# Patient Record
Sex: Male | Born: 1945
Health system: Southern US, Community
[De-identification: ages and names within clinical notes are randomized; demographics above are authoritative.]

## PROBLEM LIST (undated history)

## (undated) ENCOUNTER — Ambulatory Visit: Payer: Medicare Other | Source: Home / Self Care

## (undated) DIAGNOSIS — C649 Malignant neoplasm of unspecified kidney, except renal pelvis: Secondary | ICD-10-CM

## (undated) DIAGNOSIS — K625 Hemorrhage of anus and rectum: Secondary | ICD-10-CM

## (undated) DIAGNOSIS — N3281 Overactive bladder: Secondary | ICD-10-CM

## (undated) DIAGNOSIS — T7840XA Allergy, unspecified, initial encounter: Secondary | ICD-10-CM

## (undated) DIAGNOSIS — G8929 Other chronic pain: Secondary | ICD-10-CM

## (undated) DIAGNOSIS — G5600 Carpal tunnel syndrome, unspecified upper limb: Secondary | ICD-10-CM

## (undated) DIAGNOSIS — R350 Frequency of micturition: Secondary | ICD-10-CM

## (undated) DIAGNOSIS — K6289 Other specified diseases of anus and rectum: Secondary | ICD-10-CM

## (undated) DIAGNOSIS — F329 Major depressive disorder, single episode, unspecified: Secondary | ICD-10-CM

## (undated) DIAGNOSIS — H409 Unspecified glaucoma: Secondary | ICD-10-CM

## (undated) DIAGNOSIS — Z860100 Personal history of colon polyps, unspecified: Secondary | ICD-10-CM

## (undated) DIAGNOSIS — F4310 Post-traumatic stress disorder, unspecified: Secondary | ICD-10-CM

## (undated) DIAGNOSIS — C169 Malignant neoplasm of stomach, unspecified: Secondary | ICD-10-CM

## (undated) DIAGNOSIS — I10 Essential (primary) hypertension: Secondary | ICD-10-CM

## (undated) DIAGNOSIS — J309 Allergic rhinitis, unspecified: Secondary | ICD-10-CM

## (undated) DIAGNOSIS — R351 Nocturia: Secondary | ICD-10-CM

## (undated) DIAGNOSIS — K5900 Constipation, unspecified: Secondary | ICD-10-CM

## (undated) DIAGNOSIS — F32A Depression, unspecified: Secondary | ICD-10-CM

## (undated) DIAGNOSIS — M549 Dorsalgia, unspecified: Secondary | ICD-10-CM

## (undated) DIAGNOSIS — R252 Cramp and spasm: Secondary | ICD-10-CM

## (undated) DIAGNOSIS — M1990 Unspecified osteoarthritis, unspecified site: Secondary | ICD-10-CM

## (undated) DIAGNOSIS — G4730 Sleep apnea, unspecified: Secondary | ICD-10-CM

## (undated) DIAGNOSIS — E119 Type 2 diabetes mellitus without complications: Secondary | ICD-10-CM

## (undated) DIAGNOSIS — IMO0002 Reserved for concepts with insufficient information to code with codable children: Secondary | ICD-10-CM

## (undated) DIAGNOSIS — K219 Gastro-esophageal reflux disease without esophagitis: Secondary | ICD-10-CM

## (undated) DIAGNOSIS — C61 Malignant neoplasm of prostate: Secondary | ICD-10-CM

## (undated) DIAGNOSIS — R209 Unspecified disturbances of skin sensation: Secondary | ICD-10-CM

## (undated) DIAGNOSIS — F419 Anxiety disorder, unspecified: Secondary | ICD-10-CM

## (undated) DIAGNOSIS — Z8719 Personal history of other diseases of the digestive system: Secondary | ICD-10-CM

## (undated) DIAGNOSIS — E785 Hyperlipidemia, unspecified: Secondary | ICD-10-CM

## (undated) HISTORY — DX: Reserved for concepts with insufficient information to code with codable children: IMO0002

## (undated) HISTORY — DX: Hyperlipidemia, unspecified: E78.5

## (undated) HISTORY — PX: UPPER GASTROINTESTINAL ENDOSCOPY: SHX188

## (undated) HISTORY — DX: Type 2 diabetes mellitus without complications: E11.9

## (undated) HISTORY — DX: Carpal tunnel syndrome, unspecified upper limb: G56.00

## (undated) HISTORY — PX: WOUND EXPLORATION: SHX2669

## (undated) HISTORY — PX: POLYPECTOMY: SHX149

## (undated) HISTORY — DX: Personal history of other diseases of the digestive system: Z87.19

## (undated) HISTORY — PX: SHOULDER SURGERY: SHX246

## (undated) HISTORY — PX: CARPAL TUNNEL RELEASE: SHX101

## (undated) HISTORY — PX: CERVICAL FUSION: SHX112

## (undated) HISTORY — PX: OTHER SURGICAL HISTORY: SHX169

## (undated) HISTORY — PX: COLONOSCOPY: SHX174

## (undated) HISTORY — DX: Essential (primary) hypertension: I10

## (undated) HISTORY — DX: Allergic rhinitis, unspecified: J30.9

## (undated) HISTORY — DX: Malignant neoplasm of unspecified kidney, except renal pelvis: C64.9

## (undated) HISTORY — PX: BACK SURGERY: SHX140

## (undated) HISTORY — DX: Gastro-esophageal reflux disease without esophagitis: K21.9

## (undated) HISTORY — DX: Unspecified disturbances of skin sensation: R20.9

## (undated) HISTORY — DX: Other specified diseases of anus and rectum: K62.89

## (undated) HISTORY — DX: Cramp and spasm: R25.2

## (undated) HISTORY — PX: ESOPHAGOGASTRODUODENOSCOPY: SHX1529

## (undated) HISTORY — DX: Malignant neoplasm of stomach, unspecified: C16.9

## (undated) HISTORY — DX: Malignant neoplasm of prostate: C61

## (undated) HISTORY — DX: Allergy, unspecified, initial encounter: T78.40XA

---

## 1998-11-10 ENCOUNTER — Encounter: Payer: Self-pay | Admitting: Internal Medicine

## 1998-11-10 ENCOUNTER — Ambulatory Visit (HOSPITAL_COMMUNITY): Admission: RE | Admit: 1998-11-10 | Discharge: 1998-11-10 | Payer: Self-pay | Admitting: Internal Medicine

## 1999-07-12 ENCOUNTER — Encounter: Payer: Self-pay | Admitting: Internal Medicine

## 1999-07-12 ENCOUNTER — Ambulatory Visit (HOSPITAL_COMMUNITY): Admission: RE | Admit: 1999-07-12 | Discharge: 1999-07-12 | Payer: Self-pay | Admitting: Internal Medicine

## 1999-07-12 ENCOUNTER — Encounter (INDEPENDENT_AMBULATORY_CARE_PROVIDER_SITE_OTHER): Payer: Self-pay

## 2000-06-27 ENCOUNTER — Inpatient Hospital Stay (HOSPITAL_COMMUNITY): Admission: EM | Admit: 2000-06-27 | Discharge: 2000-06-28 | Payer: Self-pay | Admitting: Emergency Medicine

## 2000-06-27 ENCOUNTER — Encounter: Payer: Self-pay | Admitting: Internal Medicine

## 2002-07-23 ENCOUNTER — Emergency Department (HOSPITAL_COMMUNITY): Admission: EM | Admit: 2002-07-23 | Discharge: 2002-07-23 | Payer: Self-pay | Admitting: Emergency Medicine

## 2002-07-23 ENCOUNTER — Encounter: Payer: Self-pay | Admitting: Emergency Medicine

## 2002-10-08 ENCOUNTER — Ambulatory Visit (HOSPITAL_BASED_OUTPATIENT_CLINIC_OR_DEPARTMENT_OTHER): Admission: RE | Admit: 2002-10-08 | Discharge: 2002-10-08 | Payer: Self-pay | Admitting: Orthopedic Surgery

## 2003-12-24 HISTORY — PX: OTHER SURGICAL HISTORY: SHX169

## 2004-03-22 ENCOUNTER — Ambulatory Visit: Payer: Self-pay | Admitting: Internal Medicine

## 2004-04-12 ENCOUNTER — Ambulatory Visit: Payer: Self-pay | Admitting: Internal Medicine

## 2004-09-15 ENCOUNTER — Ambulatory Visit: Payer: Self-pay | Admitting: Internal Medicine

## 2005-02-14 ENCOUNTER — Ambulatory Visit: Payer: Self-pay | Admitting: Internal Medicine

## 2005-02-20 ENCOUNTER — Ambulatory Visit: Payer: Self-pay | Admitting: Internal Medicine

## 2005-07-12 ENCOUNTER — Ambulatory Visit: Payer: Self-pay | Admitting: Internal Medicine

## 2005-08-08 ENCOUNTER — Ambulatory Visit: Payer: Self-pay | Admitting: Internal Medicine

## 2005-08-14 ENCOUNTER — Encounter (INDEPENDENT_AMBULATORY_CARE_PROVIDER_SITE_OTHER): Payer: Self-pay | Admitting: *Deleted

## 2005-08-14 ENCOUNTER — Ambulatory Visit: Payer: Self-pay | Admitting: Internal Medicine

## 2005-08-14 LAB — HM COLONOSCOPY

## 2006-04-11 ENCOUNTER — Ambulatory Visit: Payer: Self-pay | Admitting: Internal Medicine

## 2006-07-27 ENCOUNTER — Emergency Department (HOSPITAL_COMMUNITY): Admission: EM | Admit: 2006-07-27 | Discharge: 2006-07-27 | Payer: Self-pay | Admitting: Emergency Medicine

## 2007-01-18 ENCOUNTER — Emergency Department (HOSPITAL_COMMUNITY): Admission: EM | Admit: 2007-01-18 | Discharge: 2007-01-18 | Payer: Self-pay | Admitting: Emergency Medicine

## 2007-01-21 ENCOUNTER — Ambulatory Visit: Payer: Self-pay | Admitting: Internal Medicine

## 2007-01-21 ENCOUNTER — Encounter: Admission: RE | Admit: 2007-01-21 | Discharge: 2007-01-21 | Payer: Self-pay | Admitting: Internal Medicine

## 2007-03-16 ENCOUNTER — Encounter: Payer: Self-pay | Admitting: Internal Medicine

## 2007-03-16 DIAGNOSIS — J309 Allergic rhinitis, unspecified: Secondary | ICD-10-CM | POA: Insufficient documentation

## 2007-03-16 DIAGNOSIS — G5600 Carpal tunnel syndrome, unspecified upper limb: Secondary | ICD-10-CM

## 2007-03-16 DIAGNOSIS — E1136 Type 2 diabetes mellitus with diabetic cataract: Secondary | ICD-10-CM | POA: Insufficient documentation

## 2007-03-16 DIAGNOSIS — E785 Hyperlipidemia, unspecified: Secondary | ICD-10-CM

## 2007-03-16 DIAGNOSIS — I10 Essential (primary) hypertension: Secondary | ICD-10-CM

## 2007-03-16 DIAGNOSIS — E119 Type 2 diabetes mellitus without complications: Secondary | ICD-10-CM

## 2007-03-16 DIAGNOSIS — E118 Type 2 diabetes mellitus with unspecified complications: Secondary | ICD-10-CM | POA: Insufficient documentation

## 2007-03-16 DIAGNOSIS — K219 Gastro-esophageal reflux disease without esophagitis: Secondary | ICD-10-CM

## 2007-03-16 DIAGNOSIS — Z8719 Personal history of other diseases of the digestive system: Secondary | ICD-10-CM | POA: Insufficient documentation

## 2007-03-16 HISTORY — DX: Carpal tunnel syndrome, unspecified upper limb: G56.00

## 2007-03-16 HISTORY — DX: Allergic rhinitis, unspecified: J30.9

## 2007-03-16 HISTORY — DX: Hyperlipidemia, unspecified: E78.5

## 2007-03-16 HISTORY — DX: Gastro-esophageal reflux disease without esophagitis: K21.9

## 2007-03-16 HISTORY — DX: Essential (primary) hypertension: I10

## 2007-03-16 HISTORY — DX: Personal history of other diseases of the digestive system: Z87.19

## 2007-03-16 HISTORY — DX: Type 2 diabetes mellitus without complications: E11.9

## 2007-03-22 ENCOUNTER — Encounter: Admission: RE | Admit: 2007-03-22 | Discharge: 2007-03-22 | Payer: Self-pay | Admitting: Orthopedic Surgery

## 2007-05-17 ENCOUNTER — Encounter: Payer: Self-pay | Admitting: Internal Medicine

## 2007-05-21 ENCOUNTER — Ambulatory Visit: Payer: Self-pay | Admitting: Internal Medicine

## 2007-05-21 DIAGNOSIS — R209 Unspecified disturbances of skin sensation: Secondary | ICD-10-CM

## 2007-05-21 HISTORY — DX: Unspecified disturbances of skin sensation: R20.9

## 2007-05-22 DIAGNOSIS — R252 Cramp and spasm: Secondary | ICD-10-CM | POA: Insufficient documentation

## 2007-05-22 HISTORY — DX: Cramp and spasm: R25.2

## 2008-04-26 ENCOUNTER — Encounter: Admission: RE | Admit: 2008-04-26 | Discharge: 2008-04-26 | Payer: Self-pay | Admitting: Specialist

## 2008-11-17 ENCOUNTER — Ambulatory Visit: Payer: Self-pay | Admitting: Internal Medicine

## 2008-11-17 LAB — CONVERTED CEMR LAB
ALT: 22 units/L (ref 0–53)
BUN: 26 mg/dL — ABNORMAL HIGH (ref 6–23)
Basophils Absolute: 0 10*3/uL (ref 0.0–0.1)
Bilirubin, Direct: 0.1 mg/dL (ref 0.0–0.3)
Chloride: 104 meq/L (ref 96–112)
Cholesterol: 152 mg/dL (ref 0–200)
Creatinine, Ser: 1.3 mg/dL (ref 0.4–1.5)
Eosinophils Relative: 2.1 % (ref 0.0–5.0)
GFR calc non Af Amer: 71.8 mL/min (ref >60)
Glucose, Bld: 104 mg/dL — ABNORMAL HIGH (ref 70–99)
HCT: 39.5 % (ref 39.0–52.0)
LDL Cholesterol: 93 mg/dL (ref 0–99)
Leukocytes, UA: NEGATIVE
Lymphs Abs: 1.2 10*3/uL (ref 0.7–4.0)
MCV: 91.3 fL (ref 78.0–100.0)
Monocytes Absolute: 0.5 10*3/uL (ref 0.1–1.0)
Neutrophils Relative %: 47.5 % (ref 43.0–77.0)
Nitrite: NEGATIVE
PSA: 3.72 ng/mL (ref 0.10–4.00)
Platelets: 173 10*3/uL (ref 150.0–400.0)
RDW: 13.6 % (ref 11.5–14.6)
Specific Gravity, Urine: 1.025 (ref 1.000–1.030)
TSH: 1.69 microintl units/mL (ref 0.35–5.50)
Total Bilirubin: 0.7 mg/dL (ref 0.3–1.2)
Total Protein, Urine: NEGATIVE mg/dL
Triglycerides: 31 mg/dL (ref 0.0–149.0)
VLDL: 6.2 mg/dL (ref 0.0–40.0)
WBC: 3.3 10*3/uL — ABNORMAL LOW (ref 4.5–10.5)
pH: 6 (ref 5.0–8.0)

## 2008-11-23 ENCOUNTER — Ambulatory Visit: Payer: Self-pay | Admitting: Internal Medicine

## 2008-11-23 DIAGNOSIS — IMO0002 Reserved for concepts with insufficient information to code with codable children: Secondary | ICD-10-CM

## 2008-11-23 HISTORY — DX: Reserved for concepts with insufficient information to code with codable children: IMO0002

## 2008-12-01 ENCOUNTER — Ambulatory Visit: Payer: Self-pay | Admitting: Internal Medicine

## 2009-03-03 ENCOUNTER — Encounter: Admission: RE | Admit: 2009-03-03 | Discharge: 2009-03-03 | Payer: Self-pay | Admitting: Orthopedic Surgery

## 2009-03-23 ENCOUNTER — Emergency Department (HOSPITAL_COMMUNITY): Admission: EM | Admit: 2009-03-23 | Discharge: 2009-03-23 | Payer: Self-pay | Admitting: Family Medicine

## 2009-04-28 ENCOUNTER — Ambulatory Visit (HOSPITAL_COMMUNITY): Admission: RE | Admit: 2009-04-28 | Discharge: 2009-04-28 | Payer: Self-pay | Admitting: Anesthesiology

## 2009-05-03 ENCOUNTER — Ambulatory Visit (HOSPITAL_COMMUNITY): Admission: RE | Admit: 2009-05-03 | Discharge: 2009-05-04 | Payer: Self-pay | Admitting: Orthopaedic Surgery

## 2009-10-06 ENCOUNTER — Encounter: Payer: Self-pay | Admitting: Internal Medicine

## 2009-10-09 ENCOUNTER — Encounter: Admission: RE | Admit: 2009-10-09 | Discharge: 2009-10-09 | Payer: Self-pay | Admitting: Specialist

## 2009-11-19 ENCOUNTER — Telehealth (INDEPENDENT_AMBULATORY_CARE_PROVIDER_SITE_OTHER): Payer: Self-pay | Admitting: *Deleted

## 2009-11-25 ENCOUNTER — Telehealth (INDEPENDENT_AMBULATORY_CARE_PROVIDER_SITE_OTHER): Payer: Self-pay | Admitting: *Deleted

## 2009-12-20 ENCOUNTER — Ambulatory Visit: Payer: Self-pay | Admitting: Internal Medicine

## 2009-12-20 LAB — CONVERTED CEMR LAB
ALT: 22 units/L (ref 0–53)
AST: 20 units/L (ref 0–37)
BUN: 26 mg/dL — ABNORMAL HIGH (ref 6–23)
Basophils Relative: 0.5 % (ref 0.0–3.0)
Bilirubin, Direct: 0.1 mg/dL (ref 0.0–0.3)
Chloride: 102 meq/L (ref 96–112)
Eosinophils Relative: 1 % (ref 0.0–5.0)
GFR calc non Af Amer: 73.5 mL/min (ref >60)
HCT: 39.7 % (ref 39.0–52.0)
Hemoglobin, Urine: NEGATIVE
LDL Cholesterol: 107 mg/dL — ABNORMAL HIGH (ref 0–99)
Lymphs Abs: 1.4 10*3/uL (ref 0.7–4.0)
MCV: 91.7 fL (ref 78.0–100.0)
Monocytes Relative: 12.7 % — ABNORMAL HIGH (ref 3.0–12.0)
Nitrite: NEGATIVE
PSA: 5.2 ng/mL — ABNORMAL HIGH (ref 0.10–4.00)
Platelets: 167 10*3/uL (ref 150.0–400.0)
Potassium: 5.2 meq/L — ABNORMAL HIGH (ref 3.5–5.1)
RBC: 4.33 M/uL (ref 4.22–5.81)
Sodium: 140 meq/L (ref 135–145)
Specific Gravity, Urine: 1.025 (ref 1.000–1.030)
TSH: 0.66 microintl units/mL (ref 0.35–5.50)
Total Bilirubin: 0.6 mg/dL (ref 0.3–1.2)
Total Protein: 7 g/dL (ref 6.0–8.3)
Urobilinogen, UA: 0.2 (ref 0.0–1.0)
VLDL: 6.2 mg/dL (ref 0.0–40.0)
WBC: 3.2 10*3/uL — ABNORMAL LOW (ref 4.5–10.5)

## 2009-12-21 ENCOUNTER — Telehealth: Payer: Self-pay | Admitting: Internal Medicine

## 2009-12-21 DIAGNOSIS — C61 Malignant neoplasm of prostate: Secondary | ICD-10-CM | POA: Insufficient documentation

## 2009-12-21 HISTORY — DX: Malignant neoplasm of prostate: C61

## 2009-12-31 ENCOUNTER — Encounter (INDEPENDENT_AMBULATORY_CARE_PROVIDER_SITE_OTHER): Payer: Self-pay | Admitting: *Deleted

## 2010-01-14 ENCOUNTER — Ambulatory Visit: Payer: Self-pay | Admitting: Internal Medicine

## 2010-01-14 ENCOUNTER — Encounter: Payer: Self-pay | Admitting: Internal Medicine

## 2010-01-26 ENCOUNTER — Encounter: Payer: Self-pay | Admitting: Internal Medicine

## 2010-02-02 ENCOUNTER — Telehealth (INDEPENDENT_AMBULATORY_CARE_PROVIDER_SITE_OTHER): Payer: Self-pay | Admitting: *Deleted

## 2010-03-03 ENCOUNTER — Encounter: Payer: Self-pay | Admitting: Internal Medicine

## 2010-03-07 ENCOUNTER — Encounter: Payer: Self-pay | Admitting: Internal Medicine

## 2010-03-11 ENCOUNTER — Ambulatory Visit
Admission: RE | Admit: 2010-03-11 | Discharge: 2010-03-15 | Payer: Self-pay | Source: Home / Self Care | Admitting: Radiation Oncology

## 2010-03-14 ENCOUNTER — Encounter: Payer: Self-pay | Admitting: Internal Medicine

## 2010-04-06 ENCOUNTER — Telehealth (INDEPENDENT_AMBULATORY_CARE_PROVIDER_SITE_OTHER): Payer: Self-pay | Admitting: *Deleted

## 2010-05-19 ENCOUNTER — Ambulatory Visit
Admission: RE | Admit: 2010-05-19 | Discharge: 2010-06-14 | Payer: Self-pay | Source: Home / Self Care | Attending: Radiation Oncology | Admitting: Radiation Oncology

## 2010-05-27 ENCOUNTER — Ambulatory Visit
Admission: RE | Admit: 2010-05-27 | Discharge: 2010-05-27 | Payer: Self-pay | Source: Home / Self Care | Attending: Internal Medicine | Admitting: Internal Medicine

## 2010-06-14 NOTE — Progress Notes (Signed)
   Request recieved from [place], [place] [facility] [employer] sent to [place]  February 02, 2010 1:54 PM     Appended Document:  Request for records received from [place], [facility]. Request forwarded to [facility].

## 2010-06-14 NOTE — Progress Notes (Signed)
Summary: REFERRAL  Phone Note Call from Patient Call back at Home Phone [phone] Call back at [phone]   Caller: Patient Reason for Call: Referral Summary of Call: REQUEST REFERRAL TO SEE UROLOGIST, HAS BEEN HAVING TROUBLE URINATING FOR THE LAST 3 MTHS, WAS TOLD BY HIS INSURANCE THAT HIS PROSTATE LEVELS WERE HIGH.... IS [place] TO SEE DR NORINS ON 01/14/10 FOR HIS YEARLY Initial call taken by: Darnell Level,  December 21, 2009 11:28 AM  Follow-up for Phone Call        Chart reviewed: last CPX July '10 normal prostate exam. Labs reviewed: PSA 11/17/08 -3.7, 8/810 - 5.2. This is a rate of acceleration that is greater than normal, although this can be caused by conditions other than prostate cancer.   Plan - per [facility]'s [facility]. [facility] notified.  Follow-up by: Neena Rhymes MD,  December 21, 2009 1:13 PM  New Problems: ELEVATED PROSTATE SPECIFIC ANTIGEN (ICD-790.93)   New Problems: ELEVATED PROSTATE SPECIFIC ANTIGEN (ICD-790.93)

## 2010-06-14 NOTE — Letter (Signed)
Summary: [facility]   Imported By: Bubba Hales 10/14/2009 10:11:37  _____________________________________________________________________  External Attachment:    Type:   Image     Comment:   External Document

## 2010-06-14 NOTE — Assessment & Plan Note (Signed)
Summary: CPX/[facility]   Vital Signs:  Patient profile:   65 year old male Height:      72 inches Weight:      266 pounds BMI:     36.21 O2 Sat:      97 % on Room air Temp:     97.3 degrees F oral Pulse rate:   74 / minute BP sitting:   112 / 78  (left arm) Cuff size:   large  Vitals Entered By: Charlynne Cousins CMA (January 14, 2010 1:16 PM)  Nutrition Counseling: Patient's BMI is greater than 25 and therefore counseled on weight management options.  O2 Flow:  Room air CC: cpx/ ab, Back Pain, Hypertension Management, Lipid Management   Primary Care Provider:  Norins  CC:  cpx/ ab, Back Pain, Hypertension Management, and Lipid Management.  History of Present Illness: Patient presents for routine medical exam. He does endorse increased knee pain and exacerbation of back. He is s/p cervical spine surgery by Dr. Lorin Mercy in Dec '10. He has been seeing Dr. Louanne Skye for on-gong back pain. Recent Lumbar spine films May '11 rviewed: L4-5, L5-S1 DDD with some foraminal encroachment.  Patient is waiting to see Urology based on lab abnormality: rise in PSA from 3.7 to 5.2 over 12 months. His appointment is Sept 14th.   Having ED: able to attain an erection but not maintain to complete intercourse.   Back Pain History:      The patient's back pain has been present for > 6 weeks.  The pain is located in the lower back region and does not radiate below the knees.  He states this is not work related.  He states that he has had a prior history of back pain.  The patient has had a recent course of physical therapy.    Hypertension History:      He denies headache, chest pain, palpitations, dyspnea with exertion, orthopnea, PND, peripheral edema, neurologic problems, and side effects from treatment.  He notes no problems with any antihypertensive medication side effects.        Positive major cardiovascular risk factors include male age 86 years old or older, diabetes, hyperlipidemia, and  hypertension.  Negative major cardiovascular risk factors include non-tobacco-user status.        Further assessment for target organ damage reveals no history of ASHD, cardiac end-organ damage (CHF/LVH), stroke/TIA, peripheral vascular disease, renal insufficiency, or hypertensive retinopathy.    Lipid Management History:      Positive NCEP/ATP III risk factors include male age 61 years old or older, diabetes, and hypertension.  Negative NCEP/ATP III risk factors include non-tobacco-user status, no ASHD (atherosclerotic heart disease), no prior stroke/TIA, no peripheral vascular disease, and no history of aortic aneurysm.         Current Medications (verified): 1)  Diltiazem Hcl Er Beads 120 Mg  Cp24 (Diltiazem Hcl Er Beads) .... Take 1 Tablet By Mouth Once A Day 2)  Glipizide 10 Mg  Tabs (Glipizide) .... Take 1 By Mouth Qd 3)  Simvastatin 80 Mg  Tabs (Simvastatin) .... Take 1/2 Tablet By Mouth Once A Day 4)  Prilosec 20 Mg  Cpdr (Omeprazole) .... Take 1 By Mouth Qd 5)  Aspirin 325 Mg  Tabs (Aspirin) .... Take 1 By Mouth Qd 6)  Lisinopril 40 Mg  Tabs (Lisinopril) .... Take 1/2 Tablet By Mouth Once A Day 7)  Gemfibrozil 600 Mg  Tabs (Gemfibrozil) .... Take 2 Tablets By Mouth Once A Day 8)  Actos 45 Mg  Tabs (Pioglitazone Hcl) .... Take 1 Tablet By Mouth Once A Day 9)  Ergocalciferol 50000 Unit Caps (Ergocalciferol) .... Take 1 Tablet By Mouth Once A Day 10)  Ibuprofen 800 Mg Tabs (Ibuprofen) .... As Needed 11)  Tramadol Hcl 50 Mg Tabs (Tramadol Hcl) .... Take 1 Tablet By Mouth Once A Day 12)  Clotrimazole-Betamethasone 1-0.05 % Crea (Clotrimazole-Betamethasone) .... Apply To Rash Two Times A Day  Allergies (verified): No Known Drug Allergies  Past History:  Past Medical History: Last updated: 11/23/2008 BACK PAIN WITH RADICULOPATHY (ICD-729.2) PARESTHESIA (ICD-782.0) LEG CRAMPS (ICD-729.82) CARPAL TUNNEL SYNDROME, BILATERAL (ICD-354.0) GASTROINTESTINAL HEMORRHAGE, HX OF  (ICD-V12.79) GERD (ICD-530.81) HYPERTENSION (ICD-401.9) HYPERLIPIDEMIA (ICD-272.4) DIABETES MELLITUS, TYPE II (ICD-250.00) ALLERGIC RHINITIS (ICD-477.9)  Past Surgical History: Last updated: 2007/06/04 Back surgery (1983), repeat surgery; ESI '08 Shoulder surgery carpal tunnel release bilateral Management of a wound that he sustained in the millitary Cardiac Catherization (06/28/2000)-non-obstructive diseae Stress Cardiolite (12/24/2003)-negative for ischemia  Family History: Last updated: 06-04-07 mother died at 50-perioperative death father - no major health problems 1 of 6 sisters died age 10 of cancer positive fam hx for hypertension  Social History: Last updated: 06-04-2007 married-divorced '89, remarried '96 2 sons, 1 daughter retired from Teacher, English as a foreign language for [employer], has a Financial planner business  Review of Systems       The patient complains of weight gain and dyspnea on exertion.  The patient denies anorexia, fever, weight loss, decreased hearing, chest pain, peripheral edema, prolonged cough, abdominal pain, melena, hematochezia, severe indigestion/heartburn, incontinence, muscle weakness, transient blindness, difficulty walking, unusual weight change, enlarged lymph nodes, and testicular masses.         Heavy sweats day and night for 6 months or longer. It is like a hot flash.  Takes mag citrate twice a month. He did have an episode of sharp pain right side with radition to groin that caused him to lay down. Resolved spontaneously over several hours.   Physical Exam  General:  Obese AA male in no distress Head:  normocephalic, atraumatic, and no abnormalities observed.   Eyes:  vision grossly intact, pupils equal, pupils round, and corneas and lenses clear.  Further exam deferred to opthal Ears:  External ear exam shows no significant lesions or deformities.  Otoscopic examination reveals clear canals, tympanic membranes are intact bilaterally without bulging,  retraction, inflammation or discharge. Hearing is grossly normal bilaterally. Nose:  no external deformity and no external erythema.   Mouth:  Oral mucosa and oropharynx without lesions or exudates.  Teeth in good repair. Neck:  supple, full ROM, no thyromegaly, and no carotid bruits.   Chest Wall:  No deformities, masses, tenderness or gynecomastia noted. Lungs:  Normal respiratory effort, chest expands symmetrically. Lungs are clear to auscultation, no crackles or wheezes. Heart:  Normal rate and regular rhythm. S1 and S2 normal without gallop, murmur, click, rub or other extra sounds. Abdomen:  obese, soft, non-tender, normal bowel sounds, no guarding, and no hepatomegaly.   Genitalia:  deferred to up-coming urology consultation Prostate:  deferred to up- coming urology consultation Msk:  normal ROM, no joint tenderness, no joint swelling, no joint warmth, and no joint deformities.   Pulses:  2+ radial and DP pulses Extremities:  No clubbing, cyanosis, edema, or deformity noted with normal full range of motion of all joints.   Neurologic:  alert & oriented X3, cranial nerves II-XII intact, strength normal in all extremities, gait normal, and DTRs symmetrical and normal.  Skin:  turgor normal, color normal, no ecchymoses, and no ulcerations.   Cervical Nodes:  no anterior cervical adenopathy and no posterior cervical adenopathy.   Inguinal Nodes:  no R inguinal adenopathy and no L inguinal adenopathy.   Psych:  Oriented X3, memory intact for recent and remote, normally interactive, good eye contact, and not anxious appearing.    Diabetes Management Exam:    Foot Exam (with socks and/or shoes not present):       Sensory-Pinprick/Light touch:          Right medial foot (L-4): normal          Right dorsal foot (L-5): normal          Right lateral foot (S-1): normal       Sensory-other: slighly decreased deep vibratory sensation distal right foot       Inspection:          Right foot:  normal       Nails:          Right foot: normal    Eye Exam:       Eye Exam done elsewhere          Date: 07/13/2009          Results: normal          Done by: Dr Ricki Miller   Impression & Recommendations:  Problem # 1:  ELEVATED PROSTATE SPECIFIC ANTIGEN (ICD-790.93) Patient with PSA 5.2 and increase of >0.7 over 12 months.   Plan - he is scheduled to see urologist September 14th  Problem # 2:  BACK PAIN WITH RADICULOPATHY (ICD-729.2) He is seeing Dr. Louanne Skye for managment of his back pain. No mention of contemplating surgery at this time.   Problem # 3:  HYPERTENSION (ICD-401.9)  His updated medication list for this problem includes:    Diltiazem Hcl Er Beads 120 Mg Cp24 (Diltiazem hcl er beads) .Marland Kitchen... Take 1 tablet by mouth once a day    Lisinopril 40 Mg Tabs (Lisinopril) .Marland Kitchen... Take 1/2 tablet by mouth once a day  BP today: 112/78 Prior BP: 126/64 (12/01/2008)  10 Yr Risk Heart Disease: 18 %  Labs Reviewed: K+: 5.2 (12/20/2009) Creat: : 1.3 (12/20/2009)     Excellent control on present medications. Normal renal function documented.   Plan - continuepresent medications.   Problem # 4:  HYPERLIPIDEMIA (P102836.4) Tolerating medications well. LDL at 107 is almost to goal of 100 or less.  Plan - continue present medication          Improve on diet - low fat          Develope a low-impact aerobic progam: continue gym twice a week, consider adding water exercise, i.e. swimming or water aerobics  His updated medication list for this problem includes:    Simvastatin 80 Mg Tabs (Simvastatin) .Marland Kitchen... Take 1/2 tablet by mouth once a day    Gemfibrozil 600 Mg Tabs (Gemfibrozil) .Marland Kitchen... Take 2 tablets by mouth once a day  Problem # 5:  DIABETES MELLITUS, TYPE II (ICD-250.00) Serum glucose is fine. A1C was not done in routine lab panel.   Plan continue present medications        at next lab draw will need an A1C - will notifiy urology.  His updated medication list for this  problem includes:    Glipizide 10 Mg Tabs (Glipizide) .Marland Kitchen... Take 1 by mouth qd    Aspirin 325 Mg Tabs (Aspirin) .Marland Kitchen... Take 1 by mouth qd  Lisinopril 40 Mg Tabs (Lisinopril) .Marland Kitchen... Take 1/2 tablet by mouth once a day    Actos 45 Mg Tabs (Pioglitazone hcl) .Marland Kitchen... Take 1 tablet by mouth once a day  Problem # 6:  Preventive Health Care (ICD-V70.0) History as noted. Physical exam was non-focal with weight noted as problem. Lab results are fine except for elevated PSA and LDL just above goal. Immunizations - tenus in '09; given pneumovax and Zostavax at today's visit.  In summary - a very nice man in stable condidtion. He will pursue evaluation of elevated PSA. He will need to continue life-style management of diabetes and cholesterol through good diet and exercise with no change in medications. He is current with health maintenance. He will return in 6 months or as needed.   Complete Medication List: 1)  Diltiazem Hcl Er Beads 120 Mg Cp24 (Diltiazem hcl er beads) .... Take 1 tablet by mouth once a day 2)  Glipizide 10 Mg Tabs (Glipizide) .... Take 1 by mouth qd 3)  Simvastatin 80 Mg Tabs (Simvastatin) .... Take 1/2 tablet by mouth once a day 4)  Prilosec 20 Mg Cpdr (Omeprazole) .... Take 1 by mouth qd 5)  Aspirin 325 Mg Tabs (Aspirin) .... Take 1 by mouth qd 6)  Lisinopril 40 Mg Tabs (Lisinopril) .... Take 1/2 tablet by mouth once a day 7)  Gemfibrozil 600 Mg Tabs (Gemfibrozil) .... Take 2 tablets by mouth once a day 8)  Actos 45 Mg Tabs (Pioglitazone hcl) .... Take 1 tablet by mouth once a day 9)  Ergocalciferol 50000 Unit Caps (Ergocalciferol) .... Take 1 tablet by mouth once a day 10)  Ibuprofen 800 Mg Tabs (Ibuprofen) .... As needed 11)  Tramadol Hcl 50 Mg Tabs (Tramadol hcl) .... Take 1 tablet by mouth once a day 12)  Clotrimazole-betamethasone 1-0.05 % Crea (Clotrimazole-betamethasone) .... Apply to rash two times a day  Other Orders: Pneumococcal Vaccine [id number]) Admin 1st Vaccine  [id number]) Zoster (Shingles) Vaccine Live [phone]) Admin of Any Addtl Vaccine [id number])  Hypertension Assessment/Plan:      The patient's hypertensive risk group is category C: Target organ damage and/or diabetes.  His calculated 10 year risk of coronary heart disease is 18 %.  Today's blood pressure is 112/78.    Lipid Assessment/Plan:      Based on NCEP/ATP III, the patient's risk factor category is "history of diabetes".  The patient's lipid goals are as follows: Total cholesterol goal is 200; LDL cholesterol goal is 100; HDL cholesterol goal is 40; Triglyceride goal is 150.  His LDL cholesterol goal has been met.     Patient: Antonio Adams Note: All result statuses are Final unless otherwise noted.  Tests: (1) BMP (METABOL)   Sodium                    140 mEq/L                   135-145   Potassium            [H]  5.2 mEq/L                   3.5-5.1   Chloride                  102 mEq/L                   96-112   Carbon Dioxide            31  mEq/L                    19-32   Glucose              [H]  107 mg/dL                   70-99   BUN                  [H]  26 mg/dL                    6-23   Creatinine                1.3 mg/dL                   0.4-1.5   Calcium                   9.9 mg/dL                   8.4-10.5   GFR                       73.50 mL/min                >60  Tests: (2) CBC Platelet w/Diff (CBCD)   White Cell Count     [L]  3.2 K/uL                    4.5-10.5   Red Cell Count            4.33 Mil/uL                 4.22-5.81   Hemoglobin                13.4 g/dL                   13.0-17.0   Hematocrit                39.7 %                      39.0-52.0   MCV                       91.7 fl                     78.0-100.0   MCHC                      33.8 g/dL                   30.0-36.0   RDW                       14.5 %                      11.5-14.6   Platelet Count            167.0 K/uL                  150.0-400.0   Neutrophil %         [L]  42.7 %                       43.0-77.0  Lymphocyte %              43.1 %                      12.0-46.0   Monocyte %           [H]  12.7 %                      3.0-12.0   Eosinophils%              1.0 %                       0.0-5.0   Basophils %               0.5 %                       0.0-3.0   Neutrophill Absolute      1.4 K/uL                    1.4-7.7   Lymphocyte Absolute       1.4 K/uL                    0.7-4.0   Monocyte Absolute         0.4 K/uL                    0.1-1.0  Eosinophils, Absolute                             0.0 K/uL                    0.0-0.7   Basophils Absolute        0.0 K/uL                    0.0-0.1  Tests: (3) Hepatic/Liver Function Panel (HEPATIC)   Total Bilirubin           0.6 mg/dL                   0.3-1.2   Direct Bilirubin          0.1 mg/dL                   0.0-0.3   Alkaline Phosphatase      67 U/L                      39-117   AST                       20 U/L                      0-37   ALT                       22 U/L                      0-53   Total Protein             7.0 g/dL                    6.0-8.3   Albumin  4.2 g/dL                    3.5-5.2  Tests: (4) TSH (TSH)   FastTSH                   0.66 uIU/mL                 0.35-5.50  Tests: (5) Lipid Panel (LIPID)   Cholesterol               169 mg/dL                   0-200     ATP III Classification            Desirable:  < 200 mg/dL                    Borderline High:  200 - 239 mg/dL               High:  > = 240 mg/dL   Triglycerides             31.0 mg/dL                  0.0-149.0     Normal:  <150 mg/dL     Borderline High:  150 - 199 mg/dL   HDL                       55.90 mg/dL                 >39.00   VLDL Cholesterol          6.2 mg/dL                   0.0-40.0   LDL Cholesterol      [H]  107 mg/dL                   0-99  CHO/HDL Ratio:  CHD Risk                             3                    Men          Women     1/2 Average Risk     3.4          3.3      Average Risk          5.0          4.4     2X Average Risk          9.6          7.1     3X Average Risk          15.0          11.0                           Tests: (6) UDip Only (UDIP)   Color                     LT. YELLOW       RANGE:  Yellow;Lt. Yellow   Clarity                   CLEAR  Clear   Specific Gravity          1.025                       1.000 - 1.030   Urine Ph                  6.0                         5.0-8.0   Protein                   NEGATIVE                    Negative   Urine Glucose             NEGATIVE                    Negative   Ketones                   NEGATIVE                    Negative   Urine Bilirubin           NEGATIVE                    Negative   Blood                     NEGATIVE                    Negative   Urobilinogen              0.2                         0.0 - 1.0   Leukocyte Esterace        NEGATIVE                    Negative   Nitrite                   NEGATIVE                    Negative  Tests: (7) Prostate Specific Antigen (PSA)   PSA-Hyb              [H]  5.20 ng/mL                  0.10-4.00  Preventive Care Screening  Last Flu Shot:    Date:  01/14/2009    Results:  Declined     Immunizations Administered:  Pneumonia Vaccine:    Vaccine Type: Pneumovax    Site: right deltoid    Mfr: Merck    Dose: 0.5 ml    Route: IM    Given by: Ami Bullins CMA    Exp. Date: 06/05/2011    Lot #: [id number]    VIS given: 12/11/95 version given January 14, 2010.  Zostavax # 1:    Vaccine Type: Zostavax    Site: left arm    Mfr: Merck    Dose: 0.5 ml    Route: [place]    Given by: Ami Bullins CMA    Exp. Date: 12/08/2010    Lot #: [id number]    VIS given: 02/24/05 given January 14, 2010. 

## 2010-06-14 NOTE — Consult Note (Signed)
[facility]   Imported By: Phillis Knack 02/01/2010 10:40:04  _____________________________________________________________________  External Attachment:    Type:   Image     Comment:   External Document

## 2010-06-14 NOTE — Letter (Signed)
Summary: [facility]   Imported By: Phillis Knack 04/04/2010 12:54:21  _____________________________________________________________________  External Attachment:    Type:   Image     Comment:   External Document

## 2010-06-14 NOTE — Progress Notes (Signed)
  Phone Note Other Incoming   Request: Send information Summary of Call: Request for records received from [facility]. Request forwarded to [facility].     

## 2010-06-14 NOTE — Progress Notes (Signed)
  Phone Note Other Incoming   Request: Send information Summary of Call: Request received from [facility] forwarded to [facility].  .    

## 2010-06-14 NOTE — Letter (Signed)
Summary: [facility]   Imported By: Phillis Knack 03/11/2010 13:44:43  _____________________________________________________________________  External Attachment:    Type:   Image     Comment:   External Document

## 2010-06-14 NOTE — Letter (Signed)
Summary: [facility]   Imported By: Phillis Knack 03/11/2010 13:46:11  _____________________________________________________________________  External Attachment:    Type:   Image     Comment:   External Document

## 2010-06-14 NOTE — Letter (Signed)
Summary: [facility] Consult Scheduled Letter  [facility] [place]   [place], [place] [address]   Phone: [phone]  Fax: [phone]      12/31/2009 MRN: [id number]  Antonio Adams [name], [name]    Dear [name],      We have scheduled an appointment for you.  At the recommendation of Dr.Norins, we have scheduled you a consult with [facility]).Dr Adah Salvage on September 14,2011 at 8:45.Their phone number is [phone].  If this appointment day and time is not convenient for you, please feel free to call the office of the doctor you are being referred to at the number listed above and reschedule the appointment.  [facility] [place] [address] [place]  Thank you,  Patient Care Coordinator [facility]

## 2010-06-15 ENCOUNTER — Ambulatory Visit: Payer: 59 | Attending: Radiation Oncology | Admitting: Radiation Oncology

## 2010-06-15 DIAGNOSIS — C61 Malignant neoplasm of prostate: Secondary | ICD-10-CM | POA: Insufficient documentation

## 2010-06-15 DIAGNOSIS — Z510 Encounter for antineoplastic radiation therapy: Secondary | ICD-10-CM | POA: Insufficient documentation

## 2010-06-16 NOTE — Assessment & Plan Note (Signed)
Summary: pain in right leg-lb   Vital Signs:  Patient profile:   65 year old male Height:      74 inches Weight:      270 pounds BMI:     34.79 Temp:     98.1 degrees F oral Pulse rate:   79 / minute BP sitting:   108 / 72  (left arm) Cuff size:   large  Vitals Entered By: Charlsie Quest, CMA (May 27, 2010 4:27 PM) CC: Knot behind right knee/SD   Primary Care Provider:  Nikolai Wilczak  CC:  Knot behind right knee/SD.  History of Present Illness: [name] presents for a discomfort in the posterior right knee that includes swelling and limitation in flexion. He denies any injury or trauma. He has had arthritic effusion of the right knee in the past that was aspirated by Dr. Marlou Sa. He denies any recurrent arthritic pain in the knee.  In the interval since his last visit when he was found to have an elevated PSA he has been seen by Dr. Karsten Ro. He had positive prostate biopsy and is diagnosed with high grade prostate cancer. He has been under-going hormonal therapy and is going to begin XRT Monday, Jan 16th for 25 treatments followed by brachytherapy. He reports that he has tolerated hormonal therapy except for hot flashes.   Allergies: No Known Drug Allergies  Past History:  Past Medical History: ADENOCARCINOMA, PROSTATE, GLEASON GRADE 6 (ICD-185) ROUTINE GENERAL MEDICAL EXAM@HEALTH  CARE FACL (ICD-V70.0) BACK PAIN WITH RADICULOPATHY (ICD-729.2) PARESTHESIA (ICD-782.0) LEG CRAMPS (ICD-729.82) CARPAL TUNNEL SYNDROME, BILATERAL (ICD-354.0) GASTROINTESTINAL HEMORRHAGE, HX OF (ICD-V12.79) GERD (ICD-530.81) HYPERTENSION (ICD-401.9) HYPERLIPIDEMIA (ICD-272.4) DIABETES MELLITUS, TYPE II (ICD-250.00) ALLERGIC RHINITIS (ICD-477.9)  Past Surgical History: Reviewed history from 05/21/2007 and no changes required. Back surgery (1983), repeat surgery; ESI '08 Shoulder surgery carpal tunnel release bilateral Management of a wound that he sustained in the millitary Cardiac  Catherization (06/28/2000)-non-obstructive diseae Stress Cardiolite (12/24/2003)-negative for ischemia  Family History: Reviewed history from 05/21/2007 and no changes required. mother died at 50-perioperative death father - no major health problems 1 of 6 sisters died age 19 of cancer positive fam hx for hypertension  Social History: Reviewed history from 05/21/2007 and no changes required. married-divorced '89, remarried '96 2 sons, 1 daughter retired from Teacher, English as a foreign language for [employer], has a [place]  The patient denies anorexia, fever, vision loss, hoarseness, chest pain, dyspnea on exertion, prolonged cough, abdominal pain, severe indigestion/heartburn, muscle weakness, suspicious skin lesions, depression, and enlarged lymph nodes.    Physical Exam  General:  large framed heavy set AA male in no distress Head:  normocephalic and atraumatic.   Eyes:  C&S clear Lungs:  normal respiratory effort.   Heart:  normal rate and regular rhythm.   Msk:  decreased flexion right knee due to mass effect - large fluid collection in the popliteal fossa that is not hot or tender. No joint tenderness about the right knee.  Pulses:  2+ radial Neurologic:  alert & oriented X3.   Skin:  turgor normal, color normal, and no suspicious lesions.   Psych:  Oriented X3, memory intact for recent and remote, normally interactive, good eye contact, and not anxious appearing.     Impression & Recommendations:  Problem # 1:  POPLITEAL CYST, RIGHT (ICD-727.51) Exam and history are consistent with a Baker's cyst, most likely due to OA right knee.  Plan - refer to Dr. Marlou Sa for assessment and aspiration. Patient is established  and will call to make his own appointment  Problem # 2:  ADENOCARCINOMA, PROSTATE, GLEASON GRADE 6 (ICD-185) To being XRT monday. He is ready and confident.   Complete Medication List: 1)  Diltiazem Hcl Er Beads 120 Mg Cp24 (Diltiazem hcl er  beads) .... Take 1 tablet by mouth once a day 2)  Glipizide 10 Mg Tabs (Glipizide) .... Take 1 by mouth qd 3)  Simvastatin 80 Mg Tabs (Simvastatin) .... Take 1/2 tablet by mouth once a day 4)  Prilosec 20 Mg Cpdr (Omeprazole) .... Take 1 by mouth qd 5)  Aspirin 325 Mg Tabs (Aspirin) .... Take 1 by mouth qd 6)  Lisinopril 40 Mg Tabs (Lisinopril) .... Take 1/2 tablet by mouth once a day 7)  Gemfibrozil 600 Mg Tabs (Gemfibrozil) .... Take 2 tablets by mouth once a day 8)  Actos 45 Mg Tabs (Pioglitazone hcl) .... Take 1 tablet by mouth once a day 9)  Ergocalciferol 50000 Unit Caps (Ergocalciferol) .... Take 1 tablet by mouth once a day 10)  Ibuprofen 800 Mg Tabs (Ibuprofen) .... As needed 11)  Tramadol Hcl 50 Mg Tabs (Tramadol hcl) .... Take 1 tablet by mouth once a day 12)  Clotrimazole-betamethasone 1-0.05 % Crea (Clotrimazole-betamethasone) .... Apply to rash two times a day   Orders Added: 1)  Est. Patient Level III [id number]

## 2010-06-22 ENCOUNTER — Other Ambulatory Visit: Payer: Self-pay | Admitting: Urology

## 2010-06-22 ENCOUNTER — Ambulatory Visit
Admission: RE | Admit: 2010-06-22 | Discharge: 2010-06-22 | Disposition: A | Discharge status: Home / Self Care | Payer: 59 | Source: Ambulatory Visit | Attending: Urology | Admitting: Urology

## 2010-06-22 DIAGNOSIS — C61 Malignant neoplasm of prostate: Secondary | ICD-10-CM

## 2010-07-05 ENCOUNTER — Encounter: Payer: Self-pay | Admitting: Internal Medicine

## 2010-07-06 ENCOUNTER — Other Ambulatory Visit: Payer: Self-pay | Admitting: Specialist

## 2010-07-06 DIAGNOSIS — M545 Low back pain: Secondary | ICD-10-CM

## 2010-07-06 DIAGNOSIS — C61 Malignant neoplasm of prostate: Secondary | ICD-10-CM

## 2010-07-11 ENCOUNTER — Ambulatory Visit
Admission: RE | Admit: 2010-07-11 | Discharge: 2010-07-11 | Disposition: A | Discharge status: Home / Self Care | Payer: 59 | Source: Ambulatory Visit | Attending: Specialist | Admitting: Specialist

## 2010-07-11 DIAGNOSIS — M545 Low back pain: Secondary | ICD-10-CM

## 2010-07-11 DIAGNOSIS — C61 Malignant neoplasm of prostate: Secondary | ICD-10-CM

## 2010-07-14 ENCOUNTER — Encounter (HOSPITAL_COMMUNITY)
Admission: RE | Admit: 2010-07-14 | Discharge: 2010-07-14 | Disposition: A | Discharge status: Home / Self Care | Payer: 59 | Source: Ambulatory Visit | Attending: Urology | Admitting: Urology

## 2010-07-14 DIAGNOSIS — Z7982 Long term (current) use of aspirin: Secondary | ICD-10-CM | POA: Insufficient documentation

## 2010-07-14 DIAGNOSIS — E119 Type 2 diabetes mellitus without complications: Secondary | ICD-10-CM | POA: Insufficient documentation

## 2010-07-14 DIAGNOSIS — Z923 Personal history of irradiation: Secondary | ICD-10-CM | POA: Insufficient documentation

## 2010-07-14 DIAGNOSIS — Z01812 Encounter for preprocedural laboratory examination: Secondary | ICD-10-CM | POA: Insufficient documentation

## 2010-07-14 DIAGNOSIS — Z79899 Other long term (current) drug therapy: Secondary | ICD-10-CM | POA: Insufficient documentation

## 2010-07-14 DIAGNOSIS — G4730 Sleep apnea, unspecified: Secondary | ICD-10-CM | POA: Insufficient documentation

## 2010-07-14 DIAGNOSIS — C61 Malignant neoplasm of prostate: Secondary | ICD-10-CM | POA: Insufficient documentation

## 2010-07-14 HISTORY — PX: PROSTATE SURGERY: SHX751

## 2010-07-14 LAB — CBC
HCT: 32.4 % — ABNORMAL LOW (ref 39.0–52.0)
Hemoglobin: 10.8 g/dL — ABNORMAL LOW (ref 13.0–17.0)
MCH: 30.2 pg (ref 26.0–34.0)
MCHC: 33.3 g/dL (ref 30.0–36.0)
MCV: 90.5 fL (ref 78.0–100.0)
Platelets: 197 10*3/uL (ref 150–400)
RBC: 3.58 MIL/uL — ABNORMAL LOW (ref 4.22–5.81)
RDW: 14.3 % (ref 11.5–15.5)
WBC: 2.5 10*3/uL — ABNORMAL LOW (ref 4.0–10.5)

## 2010-07-14 LAB — COMPREHENSIVE METABOLIC PANEL
ALT: 21 U/L (ref 0–53)
AST: 25 U/L (ref 0–37)
Albumin: 3.7 g/dL (ref 3.5–5.2)
Alkaline Phosphatase: 77 U/L (ref 39–117)
BUN: 28 mg/dL — ABNORMAL HIGH (ref 6–23)
CO2: 26 mEq/L (ref 19–32)
Calcium: 10 mg/dL (ref 8.4–10.5)
Chloride: 105 mEq/L (ref 96–112)
Creatinine, Ser: 1.2 mg/dL (ref 0.4–1.5)
GFR calc Af Amer: >60 mL/min (ref >60)
GFR calc non Af Amer: >60 mL/min (ref >60)
Glucose, Bld: 193 mg/dL — ABNORMAL HIGH (ref 70–99)
Potassium: 4 mEq/L (ref 3.5–5.1)
Sodium: 140 mEq/L (ref 135–145)
Total Bilirubin: 0.7 mg/dL (ref 0.3–1.2)
Total Protein: 6.5 g/dL (ref 6.0–8.3)

## 2010-07-14 LAB — PROTIME-INR
INR: 1.02 (ref 0.00–1.49)
Prothrombin Time: 13.6 seconds (ref 11.6–15.2)

## 2010-07-14 LAB — APTT: aPTT: 28 seconds (ref 24–37)

## 2010-07-18 ENCOUNTER — Telehealth: Payer: Self-pay | Admitting: Internal Medicine

## 2010-07-21 ENCOUNTER — Ambulatory Visit (HOSPITAL_COMMUNITY): Payer: 59

## 2010-07-21 ENCOUNTER — Ambulatory Visit (HOSPITAL_COMMUNITY)
Admission: RE | Admit: 2010-07-21 | Discharge: 2010-07-21 | Disposition: A | Discharge status: Home / Self Care | Payer: 59 | Source: Ambulatory Visit | Attending: Urology | Admitting: Urology

## 2010-07-21 ENCOUNTER — Ambulatory Visit (HOSPITAL_BASED_OUTPATIENT_CLINIC_OR_DEPARTMENT_OTHER)
Admission: RE | Admit: 2010-07-21 | Discharge: 2010-07-21 | Disposition: A | Discharge status: Home / Self Care | Payer: 59 | Source: Ambulatory Visit | Attending: Urology | Admitting: Urology

## 2010-07-21 DIAGNOSIS — C61 Malignant neoplasm of prostate: Secondary | ICD-10-CM | POA: Insufficient documentation

## 2010-07-21 DIAGNOSIS — Z01818 Encounter for other preprocedural examination: Secondary | ICD-10-CM | POA: Insufficient documentation

## 2010-07-21 DIAGNOSIS — E109 Type 1 diabetes mellitus without complications: Secondary | ICD-10-CM | POA: Insufficient documentation

## 2010-07-21 DIAGNOSIS — G4733 Obstructive sleep apnea (adult) (pediatric): Secondary | ICD-10-CM | POA: Insufficient documentation

## 2010-07-21 DIAGNOSIS — R05 Cough: Secondary | ICD-10-CM | POA: Insufficient documentation

## 2010-07-21 DIAGNOSIS — R059 Cough, unspecified: Secondary | ICD-10-CM | POA: Insufficient documentation

## 2010-07-21 LAB — GLUCOSE, CAPILLARY
Glucose-Capillary: 160 mg/dL — ABNORMAL HIGH (ref 70–99)
Glucose-Capillary: 144 mg/dL — ABNORMAL HIGH (ref 70–99)

## 2010-07-21 NOTE — Letter (Signed)
Summary: [facility]   Imported By: Phillis Knack 07/11/2010 08:24:32  _____________________________________________________________________  External Attachment:    Type:   Image     Comment:   External Document

## 2010-07-26 NOTE — Op Note (Signed)
NAME:  CUNG, DRENNON NO.:  [id number]  MEDICAL RECORD NO.:  [id number]           PATIENT TYPE:  O  LOCATION:  XRAY                         FACILITY:  [facility]  PHYSICIAN:  Lenix Kidd C. Karsten Ro, M.D.  DATE OF BIRTH:  Apr 23, 1946  DATE OF PROCEDURE:  07/21/2010 DATE OF DISCHARGE:                              OPERATIVE REPORT   PREOPERATIVE DIAGNOSIS:  Adenocarcinoma of the prostate.  POSTOPERATIVE DIAGNOSIS:  Adenocarcinoma of the prostate.  PROCEDURE:  I-125 seed implant.  SURGEON:  Javaun Dimperio C. Karsten Ro, M.D.  RADIATION ONCOLOGIST:  Sheral Apley. [name], M.D.  NUMBER OF NEEDLES:  21.  NUMBER OF SEEDS:  66.  DRAINS:  16-French Foley catheter.  ESTIMATED BLOOD LOSS:  Minimal.  COMPLICATIONS:  None.  INDICATIONS:  The patient is a 65 year old male who was seen for an elevated PSA of 5.2 and was found to have adenocarcinoma on biopsy involving 7 of 12 cores with two cores from the right lobe positive for Gleason 4+4=8 and the remainder Gleason 6 and Gleason 7.  The patient is brought to the operating room today for radioactive seed implantation and we have discussed the risks, complications and alternatives.  DESCRIPTION OF OPERATION:  After informed consent, the patient was brought to the major OR, placed on the table and administered general anesthesia.  He was then moved to the dorsal lithotomy position and his perineum as well as genitalia was sterilely prepped and draped.  A 16- French Foley catheter was then placed in the bladder and filled with dilute contrast.  Transrectal ultrasound probe as well as a rectal tube were then inserted in the rectum.  The probe was affixed to the stand. Real-time ultrasonography was then performed and with the use of the SPOT PRO version 3.0 Nucletron software, the seed planning was performed real-time.  After the plan had been completed, seed implantation was undertaken.  The seeds were then implanted using the previously  designed plan and real-time ultrasonography.  After all seeds were implanted without difficulty, fluoroscopic visualization of the seed location appeared good.  The catheter was therefore removed and cystoscopy was then performed.  Flexible cystoscopy was performed using the 17-French flexible scope. This was passed under direct vision down the urethra, which was noted be normal.  The prostatic urethra revealed no lesions and it did not appear obstructing.  Upon entering the bladder, I noted 1+ trabeculation. Ureteral orifices were of normal configuration and position.  The bladder was then fully and systematically inspected and noted to be free of any tumor, stones or inflammatory lesions.  No seeds were seen within the bladder nor were there any seeds seen protruding from the base of the prostate with retroflexion of the scope.  The scope was therefore removed and a new Foley catheter was inserted.  The patient was awakened and taken to the recovery room in stable and satisfactory condition.  He tolerated the procedure well and there were no intraoperative complications.He will return to see me in 3 weeks and be given a prescription for 6 Cipro 250 mg and Vicodin HP #20.     Ahjanae Cassel C. Karsten Ro, M.D.  MCO/MEDQ  D:  07/21/2010  T:  07/21/2010  Job:  [id number]  cc:   Sheral Apley [name], M.D. [phone]  Electronically Signed by Kathie Rhodes M.D. on 07/26/2010 08:45:51 AM

## 2010-07-26 NOTE — Progress Notes (Signed)
Summary: Med Inquiry  Phone Note Call from Patient   Caller: Patient Summary of Call: Pt called stating that he has surgery scheduled for Thursday 07/20/10.Marland KitchenPt c/o persistant cough and is inquiring if he should have an ABI Rx started before surgery Initial call taken by: Mackey Birchwood [facility]),  July 18, 2010 4:57 PM  Follow-up for Phone Call        called pt. He is to have seed implant. he has had bloody mucus which has cleared so that he is coughing but only has a light colored mucus, no fever, no chills, no sinus pain.  Plan - Phen/cod 1 tsp q 6 [facility]. Follow-up by: Neena Rhymes MD,  July 18, 2010 6:02 PM    New/Updated Medications: PROMETHAZINE-CODEINE 6.25-10 MG/5ML SYRP (PROMETHAZINE-CODEINE) 1 tsp q 6 Prescriptions: PROMETHAZINE-CODEINE 6.25-10 MG/5ML SYRP (PROMETHAZINE-CODEINE) 1 tsp q 6  #8 0z x 1   Entered and Authorized by:   Neena Rhymes MD   Signed by:   Neena Rhymes MD on 07/18/2010   Method used:   Telephoned to ...       [facility] [address] [phone]* (retail)       [address]       [place], [place]  [address]       Ph: [phone] or [phone]       Fax: [phone]   RxID:   [id number]

## 2010-08-11 ENCOUNTER — Ambulatory Visit: Payer: 59 | Attending: Radiation Oncology | Admitting: Radiation Oncology

## 2010-08-15 LAB — GLUCOSE, CAPILLARY: Glucose-Capillary: 160 mg/dL — ABNORMAL HIGH (ref 70–99)

## 2010-08-16 LAB — CBC
HCT: 41.3 % (ref 39.0–52.0)
Hemoglobin: 14 g/dL (ref 13.0–17.0)
MCV: 91.4 fL (ref 78.0–100.0)
Platelets: 167 10*3/uL (ref 150–400)
RDW: 14.4 % (ref 11.5–15.5)

## 2010-08-16 LAB — COMPREHENSIVE METABOLIC PANEL
Albumin: 4.1 g/dL (ref 3.5–5.2)
Alkaline Phosphatase: 67 U/L (ref 39–117)
BUN: 26 mg/dL — ABNORMAL HIGH (ref 6–23)
Creatinine, Ser: 1.13 mg/dL (ref 0.4–1.5)
Glucose, Bld: 122 mg/dL — ABNORMAL HIGH (ref 70–99)
Potassium: 5 mEq/L (ref 3.5–5.1)
Total Bilirubin: 0.8 mg/dL (ref 0.3–1.2)
Total Protein: 6.8 g/dL (ref 6.0–8.3)

## 2010-08-16 LAB — PROTIME-INR
INR: 0.98 (ref 0.00–1.49)
Prothrombin Time: 12.9 seconds (ref 11.6–15.2)
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
## 2010-09-30 NOTE — Discharge Summary (Signed)
[place]  Patient:    Antonio Adams, Antonio Adams                  MRN: [id number] Adm. Date:  [id number] Disc. Date: [id number] Attending:  Cassandria Anger Dictator:   Helayne Seminole, P.A. CC:         Heinz Knuckles. Norins, M.D. [facility]             Christy Sartorius, M.D. [facility]                           Discharge Summary  DISCHARGE DIAGNOSES: 1. Chest pain. 2. Nonobstructive coronary artery disease.  HISTORY OF PRESENT ILLNESS:  Mr. Pitzen is a 65 year old African-American male with no prior history of MI or coronary disease.  The patient was seen in the office by Dr. Alain Marion with complaints of substernal chest pain.  The patient has had the intermittent substernal chest pain for two to three weeks. The symptoms were not exertional.  Around noon on the day of admission the patient had substernal chest pain that he rated as 8/10, associated with left arm pain.  He had no nausea, vomiting, diaphoresis, or shortness of breath. Symptoms seemed to ease with antacids.  PAST MEDICAL HISTORY: 1. Non-insulin-dependent diabetes mellitus x 5 years. 2. Hypercholesterolemia, currently diet-controlled. 3. Status post back surgery x 2. 4. History of esophageal dilatation.  HOSPITAL COURSE: #1 - CARDIOVASCULAR:  The patient presented with chest pain consistent with unstable angina.  Given the patients history of diabetes and historically elevated cholesterol, cardiology was asked to see the patient.  They recommended ruling out MI with enzymes and starting the patient on heparin. They also felt the patient would probably benefit from a cardiac catheterization.  The patients initial cardiac enzymes were negative.  The patient underwent a cardiac catheterization on June 28, 2000.  The LAD showed proximal stenosis distally with slow flow.  Circumflex was okay.  The RCA had 30% proximal, also slow flow.  It appeared that the EF was 48%, possibly with some global  hypokinesis.  The final impression was that there was no obstructive coronary disease, and Dr. Olevia Perches suspected he had significant endothelial dysfunction and recommended him using aspirin, Pravachol, and Cardizem.  He made recommendations for the patient to follow up with Dr. Lyndel Safe and Dr. Linda Hedges in the office.  LABORATORY DATA PRIOR TO DISCHARGE:  As noted, cardiac enzymes were negative. Hemoglobin 11.1, hematocrit 32.8.  Urinalysis was negative.  BUN was 24, creatinine 0.9, glucose was 59 on admission.  Coagulations were normal on admission.  Fasting lipid panel was still pending at the time of this dictation.  DISCHARGE MEDICATIONS: 1. Glucotrol XL 10 mg q.d. 2. Glucophage 500 mg b.i.d., not to start until Saturday. 3. Prilosec 20 mg q.d. 4. Aspirin 325 mg q.d. 5. Pravachol 20 mg q.d. 6. Cardizem CD 180 mg q.d.  FOLLOW-UP:  The patient should follow up with Dr. Linda Hedges in two to three weeks and Dr. Lyndel Safe as instructed. DD:  06/28/00 TD:  06/29/00 Job: [id number]

## 2010-09-30 NOTE — H&P (Signed)
[place]. [facility]  Patient:    Antonio Adams, Antonio Adams                  MRN: [id number] Adm. Date:  [id number] Disc. Date: [id number] Attending:  Cassandria Anger CC:         Heinz Knuckles. Norins, M.D. [facility]                         History and Physical  DATE OF BIRTH:  10/27/1945  CHIEF COMPLAINT:  Chest pain.  HISTORY OF PRESENT ILLNESS:  The patient is a 65 year old black male who walked into the office complaining of pain in the chest going into his left shoulder.  The first episodes of pain started about a week ago and lasted from several minutes to one to two hours.  It has been happening on a daily basis. Today he had a real severe episode that he rates as 8 out of 10 in the midchest going to the left shoulder that lasted about 40 minutes.  He presented to the office with a residual discomfort of 2 out of 10.  The pain is pressure-like.  There is accompanying nausea, weakness.  He has some sweats with it.  PAST MEDICAL HISTORY:  Diabetes.  FAMILY HISTORY:  Mother died in her 94s.  Father died of unknown cause.  SOCIAL HISTORY:  The patient is married.  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  He used to smoke in the past.  MEDICATIONS: Glucophage and Glucotrol, dosages unknown.  REVIEW OF SYSTEMS:  As above.  The rest is negative.  No previous chest pain episodes.  No indigestion.  PHYSICAL EXAMINATION:  VITAL SIGNS:  Blood pressure 154/90, pulse 76, temperature 99.3, weight 231.  GENERAL:  He is in no acute distress.  HEENT:  Moist mucosa.  NECK:  Supple.  No thyromegaly or bruit.  LUNGS:  Clear to auscultation and percussion.  HEART:  S1 and S2.  No murmur and no gallop.  ABDOMEN:  Soft and nontender.  No organomegaly and no mass.  EXTREMITIES:  Lower extremities without edema.  Cranial nerves II-XII intact. Muscle strength normal.  RECTAL:  Not done.  NEUROLOGICAL:  He is alert, oriented, and  cooperative.  EKG with normal sinus rhythm.  There are no acute changes.  ASSESSMENT: 1. Chest pain, probable unstable angina.  The patient was transferred to [facility] emergency room for urgent cardiology consult.  He    received aspirin and nitroglycerin in the office.  Will start him on    Accupril and heparin drip. 2. Hypertension, will start Accupril. 3. Diabetes.  Will continue Glucotrol XL.  ADA diet.  Will need to cover    with sliding scale Humalog if needed. DD:  06/28/00 TD:  06/28/00 Job: [id number]

## 2010-09-30 NOTE — Op Note (Signed)
NAME:  Antonio Adams, Antonio Adams                     ACCOUNT NO.:  [id number]   MEDICAL RECORD NO.:  [id number]                   PATIENT TYPE:  AMB   LOCATION:  [place]                                  FACILITY:  [facility]   PHYSICIAN:  Ninetta Lights, M.D.              DATE OF BIRTH:  April 23, 1946   DATE OF PROCEDURE:  10/08/2002  DATE OF DISCHARGE:                                 OPERATIVE REPORT   PREOPERATIVE DIAGNOSES:  1. Chronic impingement with tendinopathy, rotator cuff, left shoulder.  2. Grade 4 degenerative joint disease, acromioclavicular joint.  3. Glenohumeral arthritis as well.   POSTOPERATIVE DIAGNOSES:  1. Chronic impingement with tendinopathy, rotator cuff, left shoulder.  2. Grade 4 degenerative joint disease, acromioclavicular joint.  3. Glenohumeral arthritis as well.  4. Grade 3 and some focal grade 4 changes on the glenoid.  5. Marked tendinopathy but no structural tearing in the cuff itself.   PROCEDURES:  1. Left shoulder examination under anesthesia, debridement of the     glenohumeral joint in regard to articular cartilage and undersurface     rotator cuff.  2. Acromioplasty.  3. Bursectomy.  4. Coracoacromial ligament release.  5. Excision, distal clavicle.   SURGEON:  Ninetta Lights, M.D.   ASSISTANT:  Aaron Edelman D. Petrarca, P.A.-C.   ANESTHESIA:  General.   ESTIMATED BLOOD LOSS:  Minimal.   SPECIMENS:  None.   CULTURES:  None.   COMPLICATIONS:  None.   DRESSING:  Soft compressive with sling.   DESCRIPTION OF PROCEDURE:  The patient was brought to the operating room and  placed on the operating table in supine position.  After adequate anesthesia  had been obtained, left shoulder examined.  Full motion and good stability.  Placed in a beach chair position on a shoulder positioner, prepped and  draped in the usual sterile fashion.  Three standard portals, anterior,  posterior, lateral.  Shoulder entered with a blunt obturator, distended, and  inspected.  Diffuse grade 2 and 3 changes throughout the shoulder.  Some  articular cartilage loose bodies debrided.  Focal grade 4 changes, inferior  glenoid.  Labrum intact, biceps tendon, biceps anchor intact.  Capsular and  ligamentous structures intact.  Marked yellow discoloration throughout the  rotator cuff supraspinatus and infraspinatus tendons from underling  tendinosis and tendinopathy.  Fraying on the bottom debrided.  No  significant structural tears through the cuff itself.  Cannula redirected  subacromially.  A type 2-3 acromion.  Marked bursitis.  Bursa resected, cuff  debrided.  Acromioplasty to a type 1 acromion with shaver and high-speed  bur.  CA ligament incised with the cautery.  Distal clavicle had grade 4  changes with marked spurring and distal clavicle osteolysis.  Lateral  centimeter and all periarticular spurs debrided.  Adequacy of decompression,  acromioplasty, and clavicle excision confirmed viewing from all portals.  Cuff thoroughly assessed from above and although tendinosis was there, no  structural  tears to warrant open intervention.  The instruments and fluid  removed.  Portals, shoulder, and bursa injected with Marcaine.  Portals  closed with 4-0 nylon.  A sterile compressive dressing applied.  Anesthesia  reversed.  Brought to the recovery room.  Tolerated the surgery well with no  complications.                                                Ninetta Lights, M.D.    DFM/MEDQ  D:  10/08/2002  T:  10/08/2002  Job:  [id number]

## 2010-11-08 ENCOUNTER — Encounter: Payer: Self-pay | Admitting: Internal Medicine

## 2010-11-11 ENCOUNTER — Other Ambulatory Visit (INDEPENDENT_AMBULATORY_CARE_PROVIDER_SITE_OTHER): Payer: 59

## 2010-11-11 DIAGNOSIS — Z0000 Encounter for general adult medical examination without abnormal findings: Secondary | ICD-10-CM

## 2010-11-11 DIAGNOSIS — Z0389 Encounter for observation for other suspected diseases and conditions ruled out: Secondary | ICD-10-CM

## 2010-11-11 LAB — BASIC METABOLIC PANEL
CO2: 26 mEq/L (ref 19–32)
Chloride: 106 mEq/L (ref 96–112)
Glucose, Bld: 127 mg/dL — ABNORMAL HIGH (ref 70–99)
Potassium: 4.6 mEq/L (ref 3.5–5.1)
Sodium: 138 mEq/L (ref 135–145)

## 2010-11-11 LAB — URINALYSIS
Bilirubin Urine: NEGATIVE
Hgb urine dipstick: NEGATIVE
Ketones, ur: NEGATIVE
Leukocytes, UA: NEGATIVE
Urobilinogen, UA: 0.2 (ref 0.0–1.0)
pH: 6 (ref 5.0–8.0)

## 2010-11-11 LAB — HEPATIC FUNCTION PANEL
Albumin: 4.3 g/dL (ref 3.5–5.2)
Alkaline Phosphatase: 61 U/L (ref 39–117)
Total Protein: 7 g/dL (ref 6.0–8.3)

## 2010-11-11 LAB — LIPID PANEL
HDL: 57.3 mg/dL (ref >39.00)
LDL Cholesterol: 124 mg/dL — ABNORMAL HIGH (ref 0–99)
Total CHOL/HDL Ratio: 3
Triglycerides: 32 mg/dL (ref 0.0–149.0)
VLDL: 6.4 mg/dL (ref 0.0–40.0)

## 2010-11-11 LAB — CBC WITH DIFFERENTIAL/PLATELET
Basophils Absolute: 0 10*3/uL (ref 0.0–0.1)
Eosinophils Relative: 1.8 % (ref 0.0–5.0)
HCT: 34.2 % — ABNORMAL LOW (ref 39.0–52.0)
Hemoglobin: 11.7 g/dL — ABNORMAL LOW (ref 13.0–17.0)
Lymphocytes Relative: 23 % (ref 12.0–46.0)
Monocytes Relative: 13.6 % — ABNORMAL HIGH (ref 3.0–12.0)
Platelets: 192 10*3/uL (ref 150.0–400.0)
RDW: 14.4 % (ref 11.5–14.6)
WBC: 2.5 10*3/uL — ABNORMAL LOW (ref 4.5–10.5)

## 2010-11-11 LAB — TSH: TSH: 0.85 u[IU]/mL (ref 0.35–5.50)

## 2010-11-22 ENCOUNTER — Encounter: Payer: Self-pay | Admitting: Internal Medicine

## 2010-11-23 ENCOUNTER — Ambulatory Visit (INDEPENDENT_AMBULATORY_CARE_PROVIDER_SITE_OTHER): Payer: 59 | Admitting: Internal Medicine

## 2010-11-23 VITALS — BP 118/68 | HR 83 | Temp 98.0°F | Wt 272.0 lb

## 2010-11-23 DIAGNOSIS — G4733 Obstructive sleep apnea (adult) (pediatric): Secondary | ICD-10-CM

## 2010-11-23 DIAGNOSIS — Z0000 Encounter for general adult medical examination without abnormal findings: Secondary | ICD-10-CM

## 2010-11-23 DIAGNOSIS — E785 Hyperlipidemia, unspecified: Secondary | ICD-10-CM

## 2010-11-23 DIAGNOSIS — E119 Type 2 diabetes mellitus without complications: Secondary | ICD-10-CM

## 2010-11-23 DIAGNOSIS — I10 Essential (primary) hypertension: Secondary | ICD-10-CM

## 2010-11-23 DIAGNOSIS — C61 Malignant neoplasm of prostate: Secondary | ICD-10-CM

## 2010-11-23 DIAGNOSIS — E669 Obesity, unspecified: Secondary | ICD-10-CM

## 2010-11-23 DIAGNOSIS — R413 Other amnesia: Secondary | ICD-10-CM

## 2010-11-23 NOTE — Progress Notes (Signed)
Subjective:    Patient ID: Antonio Adams, male    DOB: [birth year], 65 y.o.   MRN: [id number]  HPI Antonio Adams presents for annual medical exam. He is s/p seed implant and XRT - he complains of rectal and penile pain, difficulty with urination. He sees Dr. Karsten Ro. He had tried vesicare without good results. He is getting up every 2-2.5 hr at night.  He has had foot spurs/plantar fascititis - he sees Dr. Angelene Giovanni and has had steroid injection and he will be getting foot inserts. He has OA knees and back.   Wife reports that he seems to be having memory problems.   Not using CPAP for OSA due to nocturia.   He has gained weight over the interval: decrease exercise and increased intake.   Past Medical History  Diagnosis Date  . DIABETES MELLITUS, TYPE II 03/16/2007  . HYPERLIPIDEMIA 03/16/2007  . CARPAL TUNNEL SYNDROME, BILATERAL 03/16/2007  . HYPERTENSION 03/16/2007  . ALLERGIC RHINITIS 03/16/2007  . GERD 03/16/2007  . BACK PAIN WITH RADICULOPATHY 11/23/2008  . LEG CRAMPS 05/22/2007  . PARESTHESIA 05/21/2007  . GASTROINTESTINAL HEMORRHAGE, HX OF 03/16/2007  . ADENOCARCINOMA, PROSTATE, GLEASON GRADE 6 12/21/2009   Past Surgical History  Procedure Date  . Back surgery 1983, 2008    repeat surgery; ESI '08  . Shoulder surgery   . Carpal tunnel release     bilateral  . Cardiac catheterization 06/28/2010    non-obstructive disease  . Stress cardiolite 12/24/2003    negative for ischemia  . Wound exploration     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   Family History  Problem Relation Age of Onset  . Cancer Sister 30    Cancer  . Hypertension Other    History   Social History  . Marital Status: Married    Spouse Name: N/A    Number of Children: 2  . Years of Education: 12+   Occupational History  . Event organiser; Curator     retired from Event organiser   Social History Main Topics  . Smoking status: Former Research scientist (life sciences)  . Smokeless tobacco: Never Used    . Alcohol Use: Yes  . Drug Use: No  . Sexually Active: Yes -- Male partner(s)   Other Topics Concern  . Not on file   Social History Narrative   Married-divorced '89, remarried '96. 2 sons, 1 daughter. 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 He wishes to be a full resuscitation candidate.        Review of Systems Review of Systems  Constitutional:  Negative for fever, chills, activity change and unexpected weight change.  HEENT:  Negative for hearing loss, ear pain, congestion, neck stiffness and postnasal drip. Negative for sore throat or swallowing problems. Negative for dental complaints.   Eyes: Negative for vision loss or change in visual acuity.  Respiratory: Negative for chest tightness and wheezing.   Cardiovascular: Negative for chest pain and palpitation. Positive for decreased exercise tolerance Gastrointestinal: No change in bowel habit. No bloating or gas. No reflux or indigestion Genitourinary: Negative for urgency, flank pain. He does have some difficulty urinating, has nocturia, has dysuria and penile pain.  Musculoskeletal: Negative for myalgias. Positive for back pain, arthralgias and gait problem.  Neurological: Negative for dizziness, tremors, weakness and headaches.  Hematological: Negative for adenopathy.  Psychiatric/Behavioral: Negative for behavioral problems and dysphoric mood.       Objective:  Physical Exam Vital signs reviewed Gen'l: Well nourished well developed overweight AA male in no acute distress  HENT:  Head: Normocephalic and atraumatic.  Right Ear: External ear normal. EAC/TM nl Left Ear: External ear normal.  EAC/TM nl Nose: Nose normal.  Mouth/Throat: Oropharynx is clear and moist. Dentition - upper denture, mandible native, in good repair. No buccal lesions. Posterior pharynx clear. Eyes: Conjunctivae and sclera clear. EOM intact. Pupils are equal, round, and reactive to  light. Right eye exhibits no discharge. Left eye exhibits no discharge. Neck: Normal range of motion. Neck supple. No JVD present. No tracheal deviation present. No thyromegaly present.  Cardiovascular: Normal rate, regular rhythm, no gallop, no friction rub, no murmur heard.      Quiet precordium. 2+ radial and DP pulses . No carotid bruits Pulmonary/Chest: Effort normal. No respiratory distress or increased WOB, no wheezes, no rales. No chest wall deformity or CVAT. Abdominal: Soft. Bowel sounds are normal in all quadrants. He exhibits no distension, no tenderness, no rebound or guarding, No heptosplenomegaly  Genitourinary:  deferred to Dr. Karsten Ro Musculoskeletal: Normal range of motion. He exhibits no edema and no tenderness.       Small and large joints without redness, synovial thickening or deformity. Full range of motion preserved about all small, median and large joints.  Lymphadenopathy:    He has no cervical or supraclavicular adenopathy.  Neurological: He is alert and oriented to person, place, and time. CN II-XII intact. DTRs 2+ and symmetrical biceps, radial and patellar tendons. Cerebellar function normal with no tremor, rigidity, normal gait and station.  Skin: Skin is warm and dry. No rash noted. No erythema.  Psychiatric: He has a normal mood and affect. His behavior is normal. Thought content normal.  MMSE: 1. Day,date,year - ok 2. Content: president- ok,    Gov. - ok Current events - ok 3. Number repitition: 5 fwd -   1 error 5 rev - 3 errors; 4 - error. World reversed - ok 4. 3 word recall - 2/3 5. Serial 7's - errors , nickles in $1.25- ok Change making - ok 6. Naming objects -  ok  4 legged creatures- ok 7. Parables:  [place] - concrete  Rolling stone - concrete 8. Judgement:  Letter          Fire 9. Clock face exercise  Lab Results  Component Value Date   WBC 2.5* 11/11/2010   HGB 11.7* 11/11/2010   HCT 34.2* 11/11/2010   PLT 192.0 11/11/2010   CHOL 188  11/11/2010   TRIG 32.0 11/11/2010   HDL 57.30 11/11/2010   ALT 21 11/11/2010   AST 19 11/11/2010   NA 138 11/11/2010   K 4.6 11/11/2010   CL 106 11/11/2010   CREATININE 1.1 11/11/2010   BUN 28* 11/11/2010   CO2 26 11/11/2010   TSH 0.85 11/11/2010   PSA 0.06* 11/11/2010   INR 1.02 07/14/2010   Lab Results  Component Value Date   LDLCALC 124* 11/11/2010           Assessment & Plan:

## 2010-11-24 ENCOUNTER — Other Ambulatory Visit: Payer: Self-pay | Admitting: *Deleted

## 2010-11-24 ENCOUNTER — Encounter: Payer: Self-pay | Admitting: Internal Medicine

## 2010-11-24 DIAGNOSIS — R4189 Other symptoms and signs involving cognitive functions and awareness: Secondary | ICD-10-CM | POA: Insufficient documentation

## 2010-11-24 DIAGNOSIS — G4733 Obstructive sleep apnea (adult) (pediatric): Secondary | ICD-10-CM | POA: Insufficient documentation

## 2010-11-24 DIAGNOSIS — E669 Obesity, unspecified: Secondary | ICD-10-CM | POA: Insufficient documentation

## 2010-11-24 DIAGNOSIS — R413 Other amnesia: Secondary | ICD-10-CM | POA: Insufficient documentation

## 2010-11-24 DIAGNOSIS — Z0000 Encounter for general adult medical examination without abnormal findings: Secondary | ICD-10-CM | POA: Insufficient documentation

## 2010-11-24 MED ORDER — ROSUVASTATIN CALCIUM 20 MG PO TABS: 20.0000 mg | ORAL_TABLET | Freq: Every day | ORAL | Status: DC

## 2010-11-24 NOTE — Assessment & Plan Note (Signed)
Wife was concerned about short term memory loss. On MMSE patient did demonstrate mild short term memory loss. Discussed options with the patient and his wife: no medication, intellectual exercise vs initiate medical therapy.   Plan He has chosen to go with no medication and repeat MMSE in 6 months.         He will do some type of mental exercise, i.e. Crossword puzzles, card games, etc.

## 2010-11-24 NOTE — Assessment & Plan Note (Signed)
Patient with OSA and is supposed to be on CPAP. Due to nocturia and disrupted sleep he has not been using mask.  Plan - once nocutria controlled he is encouraged to resume CPAP.

## 2010-11-24 NOTE — Assessment & Plan Note (Signed)
Patient works at adherence to medical regimen and diet but needs work on the later. Serum glucose a little high. No A1C for this exam  Plan - continue present medications            For A1C in 3-4 weeks.

## 2010-11-24 NOTE — Assessment & Plan Note (Signed)
BP Readings from Last 3 Encounters:  11/23/10 118/68  05/27/10 108/72  01/14/10 112/78   Good control on present medications. No changes at this time.

## 2010-11-24 NOTE — Assessment & Plan Note (Signed)
Calculated LDL at 124 is above goal of 100 or less for a diabetic. He is on Zocor (simvastatin) 80 mg daily and also a CCB  Plan - D/C simvastatin           Start Crestor 20mg  once a day           Repeat lipid panel in 4 weeks. (Aug 9th)

## 2010-11-24 NOTE — Assessment & Plan Note (Signed)
Patient has a big frame but at 6'3" and 272 BMI is 34. BMI 25 is the goal - weight of 200-220.  Plan - smart food choices, PORTION SIZE CONTROL - single serving, eliminate snack foods, regular exercise. Target weight 220, goal is to loose 2 lbs a month. (24 month project)

## 2010-11-24 NOTE — Assessment & Plan Note (Signed)
Patient with cramps and plantar fascititis.  Plan - he can use ginko biloba for cramps. For uncontrolled cramps can start gabapentin.           For plantar fascitits - he may try over the counter/ off the rack inserts ([employer] recommended)

## 2010-11-24 NOTE — Assessment & Plan Note (Addendum)
Interval history as noted with urologic issues and podiatric issues. Physical exam is unremarkable. Lab results - chronic low white blood count, LDL above goal otherwise normal. He is current with colorectal cancer screening with last study April '07. Immunizations: Tdap Sept '09; Shingles vaccine Sept '11. He is due for pneumonia vaccine.  In summary - a very nice man who has several active problems. He will be seeing Dr. Karsten Ro soon and will continue on Rapaflo until seen; will work on weight management. He will continue his meds and change to crestor for better control of cholesterol with a return Aug 9th for lab.

## 2010-11-24 NOTE — Assessment & Plan Note (Signed)
Patient s/p xrt and brachytherapy. He is complaining of rectal and urethral pain. He also has symptoms that are suggestive of partial BOO. He has failed treatment for irritable bladder. PSA was o.o6  Plan - relay complaints to Dr. Karsten Ro           Trial of rapaflo daily.

## 2010-12-01 ENCOUNTER — Ambulatory Visit (AMBULATORY_SURGERY_CENTER): Payer: 59 | Admitting: *Deleted

## 2010-12-01 VITALS — Ht 72.0 in | Wt 271.1 lb

## 2010-12-01 DIAGNOSIS — Z86010 Personal history of colonic polyps: Secondary | ICD-10-CM

## 2010-12-01 DIAGNOSIS — D1330 Benign neoplasm of unspecified part of small intestine: Secondary | ICD-10-CM

## 2010-12-01 DIAGNOSIS — Z1211 Encounter for screening for malignant neoplasm of colon: Secondary | ICD-10-CM

## 2010-12-01 MED ORDER — PEG-KCL-NACL-NASULF-NA ASC-C 100 G PO SOLR: ORAL | Status: DC

## 2010-12-15 ENCOUNTER — Ambulatory Visit (AMBULATORY_SURGERY_CENTER): Payer: 59 | Admitting: Internal Medicine

## 2010-12-15 ENCOUNTER — Encounter: Payer: Self-pay | Admitting: Internal Medicine

## 2010-12-15 VITALS — BP 140/88 | HR 71 | Temp 96.7°F | Resp 18 | Ht 72.0 in | Wt 271.0 lb

## 2010-12-15 DIAGNOSIS — D375 Neoplasm of uncertain behavior of rectum: Secondary | ICD-10-CM

## 2010-12-15 DIAGNOSIS — D1330 Benign neoplasm of unspecified part of small intestine: Secondary | ICD-10-CM

## 2010-12-15 DIAGNOSIS — D126 Benign neoplasm of colon, unspecified: Secondary | ICD-10-CM

## 2010-12-15 DIAGNOSIS — Z860100 Personal history of colon polyps, unspecified: Secondary | ICD-10-CM

## 2010-12-15 DIAGNOSIS — Z1211 Encounter for screening for malignant neoplasm of colon: Secondary | ICD-10-CM

## 2010-12-15 DIAGNOSIS — D378 Neoplasm of uncertain behavior of other specified digestive organs: Secondary | ICD-10-CM

## 2010-12-15 DIAGNOSIS — D371 Neoplasm of uncertain behavior of stomach: Secondary | ICD-10-CM

## 2010-12-15 MED ORDER — SODIUM CHLORIDE 0.9 % IV SOLN: 500.0000 mL | INTRAVENOUS | Status: DC

## 2010-12-15 NOTE — Patient Instructions (Signed)
HANDOUT GIVEN ON POLYPS  REPEAT COLONOSCOPY AND ENDOSCOPY IN 5 YEARS. WE WILL SEND YOU A REMINDER LETTER  DISCHARGE INSTRUCTIONS GIVEN PER BLUE AND GREEN SHEETS  WE WILL MAIL YOU A LETTER IN 1-2 WEEKS WITH THE PATHOLOGY RESULTS AND DR PERRY'S RECOMMENDATIONS

## 2010-12-16 ENCOUNTER — Telehealth: Payer: Self-pay

## 2010-12-16 NOTE — Telephone Encounter (Signed)
Follow up Call- Patient questions:  Do you have a fever, pain , or abdominal swelling? no Pain Score  0 *  Have you tolerated food without any problems? yes  Have you been able to return to your normal activities? yes  Do you have any questions about your discharge instructions: Diet   no Medications  no Follow up visit  no  Do you have questions or concerns about your Care? no  Actions: * If pain score is 4 or above: No action needed, pain <4.  Per the pt"everything went well". maw

## 2011-01-31 ENCOUNTER — Emergency Department (HOSPITAL_COMMUNITY)
Admission: EM | Admit: 2011-01-31 | Discharge: 2011-02-01 | Disposition: A | Discharge status: Home / Self Care | Payer: 59 | Attending: Emergency Medicine | Admitting: Emergency Medicine

## 2011-01-31 DIAGNOSIS — N419 Inflammatory disease of prostate, unspecified: Secondary | ICD-10-CM | POA: Insufficient documentation

## 2011-01-31 DIAGNOSIS — E119 Type 2 diabetes mellitus without complications: Secondary | ICD-10-CM | POA: Insufficient documentation

## 2011-01-31 DIAGNOSIS — I10 Essential (primary) hypertension: Secondary | ICD-10-CM | POA: Insufficient documentation

## 2011-01-31 DIAGNOSIS — R300 Dysuria: Secondary | ICD-10-CM | POA: Insufficient documentation

## 2011-01-31 DIAGNOSIS — R10819 Abdominal tenderness, unspecified site: Secondary | ICD-10-CM | POA: Insufficient documentation

## 2011-01-31 DIAGNOSIS — R509 Fever, unspecified: Secondary | ICD-10-CM | POA: Insufficient documentation

## 2011-02-01 LAB — URINALYSIS, ROUTINE W REFLEX MICROSCOPIC
Bilirubin Urine: NEGATIVE
Glucose, UA: NEGATIVE mg/dL
Hgb urine dipstick: NEGATIVE
Ketones, ur: NEGATIVE mg/dL
Leukocytes, UA: NEGATIVE
Nitrite: NEGATIVE
Protein, ur: NEGATIVE mg/dL
Specific Gravity, Urine: 1.018 (ref 1.005–1.030)
Urobilinogen, UA: 0.2 mg/dL (ref 0.0–1.0)
pH: 5 (ref 5.0–8.0)

## 2011-02-02 LAB — URINE CULTURE: Culture: NO GROWTH

## 2011-03-23 ENCOUNTER — Other Ambulatory Visit: Payer: Self-pay | Admitting: Specialist

## 2011-03-23 DIAGNOSIS — T148XXA Other injury of unspecified body region, initial encounter: Secondary | ICD-10-CM

## 2011-03-26 ENCOUNTER — Other Ambulatory Visit: Payer: 59

## 2011-04-13 ENCOUNTER — Other Ambulatory Visit: Payer: 59

## 2011-04-15 ENCOUNTER — Ambulatory Visit
Admission: RE | Admit: 2011-04-15 | Discharge: 2011-04-15 | Disposition: A | Discharge status: Home / Self Care | Payer: 59 | Source: Ambulatory Visit | Attending: Specialist | Admitting: Specialist

## 2011-04-15 DIAGNOSIS — T148XXA Other injury of unspecified body region, initial encounter: Secondary | ICD-10-CM

## 2011-04-24 ENCOUNTER — Ambulatory Visit (INDEPENDENT_AMBULATORY_CARE_PROVIDER_SITE_OTHER): Payer: Medicare Other | Admitting: Internal Medicine

## 2011-04-24 DIAGNOSIS — E119 Type 2 diabetes mellitus without complications: Secondary | ICD-10-CM

## 2011-04-24 DIAGNOSIS — R209 Unspecified disturbances of skin sensation: Secondary | ICD-10-CM

## 2011-04-24 DIAGNOSIS — IMO0002 Reserved for concepts with insufficient information to code with codable children: Secondary | ICD-10-CM

## 2011-04-24 DIAGNOSIS — J069 Acute upper respiratory infection, unspecified: Secondary | ICD-10-CM

## 2011-04-24 MED ORDER — PROMETHAZINE-CODEINE 6.25-10 MG/5ML PO SYRP: 5.0000 mL | ORAL_SOLUTION | ORAL | Status: AC | PRN

## 2011-04-24 MED ORDER — AZITHROMYCIN 250 MG PO TABS: ORAL_TABLET | ORAL | Status: AC

## 2011-04-24 NOTE — Progress Notes (Signed)
  Subjective:    Patient ID: Antonio Adams, male    DOB: [birth year], 65 y.o.   MRN: [id number]  HPI Antonio Adams presents for paresthesia of both feet. He is not sure if it is from his diabetes or his back. The discomfort is getting worse. If he stands for a prolonged time he will get pain and paresthesia in the right leg. He sees Dr. Louanne Skye who has ordered MRI and they are talking about possible back surgery.  He reports that his CBG has been generally controlled but elevated lately due to cough syrups, etc.   He has 2 weeks history of URI: cough productive of yellow mucus. Cough has kept him awake at night.  He is not getting complete relief with otc antitussives. No notable SOB. He has not had documented fever. He does have hot flashes due to hormonal injections for prostate cancer.   He is followed - by Dr. Karsten Ro. [name] reports he does have some dysuria which is chronic.    Review of Systems System review is negative for any constitutional, cardiac, pulmonary, GI or neuro symptoms or complaints other than as described in the HPI.     Objective:   Physical Exam Vitals - reviewed and normal Gen'l0- heavy set large framed AA man in no distress HEENT - no sinus tenderness, throat clear Neck- supple Nodes - neg cervical area Chest - clear to A&P Cor- distant but regular Neuro - poor sharp dull discrimination, poor deep vibratory sensation.      Assessment & Plan:  URI - mild symptoms Plan - z-pak, prom/cod, hydrate

## 2011-04-24 NOTE — Patient Instructions (Signed)
1. Upper respiratory infection - plan : Z-pak as directed (antibiotic); promethazine with codeine cough syrup 1 tsp every 4-6 hours as needed, may take 2 tsps at bedtime, hydrate.  2. Numbness in the feet - I believe that this is predominantly related to diabetic nerve damage. Plan - good control of blood sugar. [facility] to have the information for good control. Continue the gabapentin at bedtime to help with this.  3. Sciatica - MRI is suspicious for nerve root compression as a cause of right leg problems. Plan - you and Dr. Louanne Skye.   MR Lumbar Spine Wo Contrast Status:  Final result                     Study Result     *RADIOLOGY REPORT*  Clinical Data: Low back pain. Right thigh pain and numbness.  History of prostate cancer. Neurogenic claudication.  MRI LUMBAR SPINE WITHOUT CONTRAST  Technique: Multiplanar and multiecho pulse sequences of the lumbar  spine were obtained without intravenous contrast.  Comparison: Multiple priors, most recent 07/11/2010.  Findings: Noncontrast examination was ordered.  Anatomic alignment is present. There is disc space narrowing at L5-  S1 from previous surgery. There is mild disc desiccation at L4-L5.  Conus is normal. There are no extraspinal masses. Marrow signal is  homogeneous except for endplate reactive changes above below L4-5  and L5-S1. There are no areas suspicious for metastatic disease  although sensitivity is reduced to injection of epidural tumor in  the absence of IV contrast.  The individual disc spaces are examined as follows:  L1-2: Normal disc space. Mild facet arthropathy.  L2-3: Normal disc space. Mild bulge. Mild facet arthropathy.  L3-4: Normal disc space. Mild bulge. Mild facet arthropathy.  L4-5: Broad-based central and rightward protrusion. Mild to  moderate posterior element hypertrophy affects facets and  ligamentum flava. Bilateral L5 nerve root impingement is noted.  Neural  foraminal narrowing bilaterally could affect the L4 nerve  roots.  L5-S1: Recurrent central and rightward extrusion. Moderate facet  arthropathy. Right S1 nerve root displacement. Bilateral neural  foraminal narrowing affects the L5 nerve roots, worse on the left  Compared with February 2012 study, the disc extrusion at L5 on the  right is worse. The central protrusion and spinal stenosis L4-5 is  approximately the same.  IMPRESSION:  Worsening central and rightward extrusion at L5-S1. Right S1 nerve  root compression is noted, along with bilateral neural foraminal  narrowing affecting the L5 nerve roots.  Multifactorial spinal stenosis at L4-5 is approximately the same,  with bilateral L5 nerve root impingement, central canal stenosis,  and bilateral neural foraminal narrowing.  Original Report Authenticated By: Staci Righter, M.D.

## 2011-04-24 NOTE — Assessment & Plan Note (Signed)
Followed at the [facility] - needs A1C for monitoring.           Peripheral neuropathy - most likely DM related - bilateral feet.

## 2011-04-24 NOTE — Assessment & Plan Note (Signed)
Diabetic related.

## 2011-04-24 NOTE — Assessment & Plan Note (Signed)
MRI with L4-5, L5-S1 DDD with nerve root impingement.  Plan - per Dr. Louanne Skye

## 2011-05-03 ENCOUNTER — Encounter: Payer: Self-pay | Admitting: *Deleted

## 2011-05-03 NOTE — Progress Notes (Signed)
On 08/12/10 I-PPS score= [id number] score=22

## 2011-05-22 DIAGNOSIS — M5126 Other intervertebral disc displacement, lumbar region: Secondary | ICD-10-CM | POA: Diagnosis present

## 2011-05-22 DIAGNOSIS — M5416 Radiculopathy, lumbar region: Secondary | ICD-10-CM | POA: Diagnosis present

## 2011-05-22 NOTE — H&P (Signed)
Antonio Adams is an 66 y.o. male.   Chief Complaint:   Right leg pain, numbness and weakness HPI:  Patient has chronic and progressive back pain with increasing symptoms of right lower extremity neurogenic claudication.  Pain is worse with standing and ambulating.  He has been treated with ESI's which no longer alleviate his symptoms.  EMG/NCV indicate right lower lumbar radiculopathy.  His most recent MRI of the lumbar spine shows disc protrusion at right L4-5 and right L5-S1 causing foraminal and lateral recess stenosis at both levels, worse at L5-S1 and affecting both the right L5 and S1 nerve roots.  After discussion of risk and benefits, as well as, operative vs nonoperative treatment , pt wishes to proceed with microdiscectomy of the right L4-5 and right L5-S1 level by Dr. Louanne Skye.  Past Medical History  Diagnosis Date  . DIABETES MELLITUS, TYPE II 03/16/2007  . HYPERLIPIDEMIA 03/16/2007  . CARPAL TUNNEL SYNDROME, BILATERAL 03/16/2007  . HYPERTENSION 03/16/2007  . ALLERGIC RHINITIS 03/16/2007  . GERD 03/16/2007  . BACK PAIN WITH RADICULOPATHY 11/23/2008  . LEG CRAMPS 05/22/2007  . PARESTHESIA 05/21/2007  . GASTROINTESTINAL HEMORRHAGE, HX OF 03/16/2007  . ADENOCARCINOMA, PROSTATE, GLEASON GRADE 6 12/21/2009        Sleep apnea requiring CPAP  Past Surgical History  Procedure Date  . Back surgery 1983, 2008    repeat surgery; ESI '08  . Shoulder surgery   . Carpal tunnel release     bilateral  . Cardiac catheterization 06/28/2010    non-obstructive disease  . Stress cardiolite 12/24/2003    negative for ischemia  . Wound exploration     management of a wound that he sustained in the [employer]  . Prostate surgery march 2012    seed implant    Family History  Problem Relation Age of Onset  . Cancer Sister 30    Cancer  . Hypertension Other   . Colon cancer Neg Hx   . Esophageal cancer Neg Hx   . Stomach cancer Neg Hx    Social History:  reports that he has quit smoking. He has never  used smokeless tobacco. He reports that he does not drink alcohol or use illicit drugs. Patient is married and lives with his wife.  Retired  Allergies: No Known Allergies  Medications Prior to Admission  Medication Dose Route Frequency Provider Last Rate Last Dose  . 0.9 %  sodium chloride infusion  500 mL Intravenous Continuous Scarlette Shorts, MD       Medications Prior to Admission  Medication Sig Dispense Refill  . aspirin 325 MG tablet Take 325 mg by mouth daily.        . calcium carbonate (OS-CAL) 600 MG TABS Take 600 mg by mouth daily.        . Cholecalciferol 1000 UNITS capsule Take 1,000 Units by mouth daily.        . clonazePAM (KLONOPIN) 0.5 MG tablet Take 0.5 mg by mouth as needed.        . clotrimazole-betamethasone (LOTRISONE) cream Apply topically 2 (two) times daily.        Marland Kitchen diltiazem (DILACOR XR) 120 MG 24 hr capsule Take 120 mg by mouth daily.        . ergocalciferol (VITAMIN D2) 50000 UNITS capsule Take 50,000 Units by mouth daily.        Marland Kitchen gabapentin (NEURONTIN) 300 MG capsule Take 300 mg by mouth as needed.        Marland Kitchen gemfibrozil (LOPID) 600  MG tablet Take 2 tablets by mouth once a day       . glipiZIDE (GLUCOTROL) 10 MG tablet Take 10 mg by mouth daily.        . Hydrocodone-Acetaminophen 7.5-300 MG TABS Take by mouth as needed.        Marland Kitchen ibuprofen (ADVIL,MOTRIN) 800 MG tablet Take 800 mg by mouth as needed.        Marland Kitchen lisinopril (PRINIVIL,ZESTRIL) 40 MG tablet Take 12 tablet by mouth once a day       . Multiple Vitamin (MULTIVITAMIN) tablet Take 1 tablet by mouth daily.        Marland Kitchen omeprazole (PRILOSEC) 20 MG capsule Take 20 mg by mouth daily.        . pioglitazone (ACTOS) 45 MG tablet Take 45 mg by mouth daily.        . rosuvastatin (CRESTOR) 20 MG tablet Take 1 tablet (20 mg total) by mouth at bedtime.  30 tablet  11  . silodosin (RAPAFLO) 8 MG CAPS capsule Take 8 mg by mouth as needed.        . traMADol (ULTRAM) 50 MG tablet Take 50 mg by mouth daily.          No results  found for this or any previous visit (from the past 48 hour(s)). No results found.  Review of Systems  Constitutional: Negative.   HENT: Negative.   Eyes: Negative.   Respiratory: Negative.   Cardiovascular: Negative.   Gastrointestinal: Negative.   Genitourinary: Negative.   Musculoskeletal: Positive for back pain.  Skin: Negative.   Neurological: Positive for sensory change and focal weakness.       Symptoms of the right lower extremity  Endo/Heme/Allergies: Negative.   Psychiatric/Behavioral: Negative.     There were no vitals taken for this visit. Physical Exam  Constitutional: He is oriented to person, place, and time. He appears well-developed and well-nourished.  HENT:  Head: Normocephalic and atraumatic.  Eyes: EOM are normal. Pupils are equal, round, and reactive to light.  Neck: Normal range of motion. Neck supple.  Cardiovascular: Normal rate, regular rhythm and normal heart sounds.   Respiratory: Effort normal and breath sounds normal.  GI: Soft. Bowel sounds are normal.  Musculoskeletal:       Right LE:  EHL strength 4/5, PF 5-/5  Left LE:  No focal weakness DTR:  Knee +2 and sym            Ankle +1 left and 0 right Negative SLR bil Distal pulses +1 DP bil No edema of bil LE's   Neurological: He is oriented to person, place, and time.  Skin: Skin is warm and dry.  Psychiatric: He has a normal mood and affect.     Assessment/Plan 1.  Right L4-5 and right L5-S1 HNP with foraminal and lateral recess stenosis causing L5 and S1 radiculopathy  PLAN:  Microdiscectomy right L4-5 and right L5-S1using MIS equipment  Bo Teicher M 05/22/2011, 11:05 AM

## 2011-05-24 NOTE — H&P (Signed)
H and P reviewed, jen

## 2011-05-25 ENCOUNTER — Encounter (HOSPITAL_COMMUNITY): Payer: Self-pay

## 2011-05-25 ENCOUNTER — Encounter (HOSPITAL_COMMUNITY)
Admission: RE | Admit: 2011-05-25 | Discharge: 2011-05-25 | Disposition: A | Discharge status: Home / Self Care | Payer: Medicare Other | Source: Ambulatory Visit | Attending: Specialist | Admitting: Specialist

## 2011-05-25 ENCOUNTER — Encounter (HOSPITAL_COMMUNITY): Payer: Self-pay | Admitting: Respiratory Therapy

## 2011-05-25 DIAGNOSIS — Z01812 Encounter for preprocedural laboratory examination: Secondary | ICD-10-CM | POA: Insufficient documentation

## 2011-05-25 DIAGNOSIS — Z01818 Encounter for other preprocedural examination: Secondary | ICD-10-CM | POA: Insufficient documentation

## 2011-05-25 HISTORY — DX: Overactive bladder: N32.81

## 2011-05-25 HISTORY — DX: Sleep apnea, unspecified: G47.30

## 2011-05-25 LAB — SURGICAL PCR SCREEN
MRSA, PCR: NEGATIVE
Staphylococcus aureus: NEGATIVE

## 2011-05-25 LAB — URINALYSIS, ROUTINE W REFLEX MICROSCOPIC
Bilirubin Urine: NEGATIVE
Glucose, UA: >1000 mg/dL — AB
Hgb urine dipstick: NEGATIVE
Ketones, ur: NEGATIVE mg/dL
Protein, ur: NEGATIVE mg/dL
Urobilinogen, UA: 0.2 mg/dL (ref 0.0–1.0)

## 2011-05-25 LAB — COMPREHENSIVE METABOLIC PANEL
ALT: 15 U/L (ref 0–53)
Albumin: 4.1 g/dL (ref 3.5–5.2)
Alkaline Phosphatase: 131 U/L — ABNORMAL HIGH (ref 39–117)
BUN: 31 mg/dL — ABNORMAL HIGH (ref 6–23)
Chloride: 100 mEq/L (ref 96–112)
Potassium: 5.1 mEq/L (ref 3.5–5.1)
Sodium: 134 mEq/L — ABNORMAL LOW (ref 135–145)
Total Bilirubin: 0.2 mg/dL — ABNORMAL LOW (ref 0.3–1.2)
Total Protein: 7.2 g/dL (ref 6.0–8.3)

## 2011-05-25 LAB — DIFFERENTIAL
Basophils Relative: 1 % (ref 0–1)
Eosinophils Absolute: 0.1 10*3/uL (ref 0.0–0.7)
Eosinophils Relative: 2 % (ref 0–5)
Lymphs Abs: 0.7 10*3/uL (ref 0.7–4.0)
Monocytes Absolute: 0.4 10*3/uL (ref 0.1–1.0)
Monocytes Relative: 12 % (ref 3–12)

## 2011-05-25 LAB — CBC
HCT: 31.2 % — ABNORMAL LOW (ref 39.0–52.0)
Hemoglobin: 11.1 g/dL — ABNORMAL LOW (ref 13.0–17.0)
MCHC: 35.6 g/dL (ref 30.0–36.0)
RDW: 13.3 % (ref 11.5–15.5)
WBC: 3.1 10*3/uL — ABNORMAL LOW (ref 4.0–10.5)

## 2011-05-25 LAB — URINE MICROSCOPIC-ADD ON

## 2011-05-25 MED ORDER — CHLORHEXIDINE GLUCONATE 4 % EX LIQD: 60.0000 mL | Freq: Once | CUTANEOUS | Status: DC

## 2011-05-25 NOTE — Progress Notes (Signed)
Requested cardiac cath from pt's PCP, Dr. Linda Hedges, if available.

## 2011-05-25 NOTE — Pre-Procedure Instructions (Signed)
20 Antonio Adams  05/25/2011   Your procedure is scheduled on:  Monday January 14  Report to [facility] at 5:30 AM.  Call this number if you have problems the morning of surgery: [phone]   Remember:   Do not eat food:After Midnight.  May have clear liquids: up to 4 Hours before arrival.  Clear liquids include soda, tea, black coffee, apple or grape juice, broth.  Take these medicines the morning of surgery with A SIP OF WATER: Hydrocodone, Klonopin if needed, Diltiazem, Neurontin, Prilosec   Do not wear jewelry, make-up or nail polish.  Do not wear lotions, powders, or perfumes. You may wear deodorant.  Do not shave 48 hours prior to surgery.  Do not bring valuables to the hospital.  Contacts, dentures or bridgework may not be worn into surgery.  Leave suitcase in the car. After surgery it may be brought to your room.  For patients admitted to the hospital, checkout time is 11:00 AM the day of discharge.   Patients discharged the day of surgery will not be allowed to drive home.  Name and phone number of your driver: NA  Special Instructions: Incentive Spirometry - Practice and bring it with you on the day of surgery. and CHG Shower Use Special Wash: 1/2 bottle night before surgery and 1/2 bottle morning of surgery.   Please read over the following fact sheets that you were given: Pain Booklet, Coughing and Deep Breathing and Surgical Site Infection Prevention

## 2011-05-25 NOTE — Progress Notes (Signed)
Requested EKG, ECHO, cardiac notes from [facility].  Pt reports having cardiac cath ordered by Dr. Karsten Ro. Office is checking to see if they have a record of this.

## 2011-05-26 ENCOUNTER — Encounter (HOSPITAL_COMMUNITY): Payer: Self-pay | Admitting: Vascular Surgery

## 2011-05-26 NOTE — Consult Note (Signed)
Anesthesia:  Patient is a 66 year old male scheduled for a right L4-5, L5-S1 microdiscectomy on 05/29/11.  His history includes DM2, HLD, HTN, GERD, GIB, Prostate CA, OSA, overactive bladder, and former smoker.  His PCP is Dr. Adella Hare.  He is also followed at the [facility] in [place].  Labs show a significantly elevated glucose of 458.  H/H were decreased at 11.1/31/2.  CXR from 07/21/10 showed no active cardiopulmonary disease.  We are still awaiting records from the [facility] in [place].  According to Dr. Daylene Posey 06/16/10 OV note, he had remote cardiac testing including a 2002 cath showing non-obstructive disease and a negative stress test in 12/24/03.  I called Mr. Gilpatrick to inquire about his elevated blood sugars.  He reports that they began rising at the end of November 2012.  At first morning glucose readings were in the high 100s, but by December they were running 220-410.  His fasting glucose is 301 this am.  He is taking Glipizide and Metformin as prescribed.  He denies recent steroid use, fever, dysuria, respiratory symptoms, or obvious infection.  His only new medication is Toviaz, and I don't see that hyperglycemia is a common side effect.  He has recently lost weight.  He denies CP and SOB at rest.  He has DOE with mild-moderate exertion which he feels is stable.  He denies any significant LE edema.  His last available EKG is from 01/14/10 and shows SR, mild inferolateral ST elevation (possible repolarization variant), and overall felt to be a probably normal EKG.  He is not followed by a Cardiologist.  I reviewed his hyperglycemia history and 01/2010 EKG findings with Anesthesiologist Dr. Glennon Mac.  She agrees that he should be evaluated either at the [facility] or by Dr. Daylene Posey pre-operatively to get his DM2 better controlled.  Based on the available Cardiac records, she did not feel he needed additional testing or Cardiology follow-up other than a repeat EKG that is within the past  year.    I notified Sherrie at Dr. Otho Ket office of need for medical evaluation for uncontrolled DM.  Currently, we do not have an EKG within 1 year, so I also recommended that she see if his PCP can do one at their office when he is evaluated for hyperglycemia, so we can avoid any same day surgery tests.  She will notify the patient.

## 2011-05-29 ENCOUNTER — Encounter (HOSPITAL_COMMUNITY): Admission: RE | Payer: Self-pay | Source: Ambulatory Visit

## 2011-05-29 ENCOUNTER — Inpatient Hospital Stay (HOSPITAL_COMMUNITY): Admission: RE | Admit: 2011-05-29 | Payer: Medicare Other | Source: Ambulatory Visit | Admitting: Specialist

## 2011-05-29 SURGERY — MICRODISCECTOMY LUMBAR LAMINECTOMY
Anesthesia: General

## 2011-06-07 ENCOUNTER — Ambulatory Visit (INDEPENDENT_AMBULATORY_CARE_PROVIDER_SITE_OTHER): Payer: Medicare Other | Admitting: Internal Medicine

## 2011-06-07 DIAGNOSIS — E119 Type 2 diabetes mellitus without complications: Secondary | ICD-10-CM

## 2011-06-07 MED ORDER — METFORMIN HCL 1000 MG PO TABS: 1000.0000 mg | ORAL_TABLET | Freq: Two times a day (BID) | ORAL | Status: DC

## 2011-06-07 NOTE — Patient Instructions (Signed)
Reviewed all your medications: none of the medicines would be responsible for a rise in blood sugar! All of the medications, taken problem by problem, are appropriate.  Cholesterol - last LDL 124 was above goal of 100 or less, thus continue the crestor and gemfibrozil  Blood pressure - too high today but generally well controlled at home, thus continue the lisinopril and the diltiazem.  For prostate enlargement  - continue the tamsulosin  For pain and sleep you can continue the present medications on an as needed basis.   Omeprazole (prilosec) is for stomach problems - it reduces acid production. You may take this as needed.  Diabetes - do not know why it is out of control over the past month. Plan - stop glipizide. Increase metformin to 1000 mg twice a day (new Rx sent to pharmacy). Add onglyza - a drug that helps to get the pancrease to make more insulin and it works very well with metform. Check your blood sugar fasting int he AM and one other time a day, altenating times. Get back to me in 10-14 days to let me know your range. May need to increase the onglyza or if it is not working at all we can consider additional options including basal insulin.

## 2011-06-07 NOTE — Progress Notes (Signed)
  Subjective:    Patient ID: Antonio Adams, male    DOB: 1945-12-11, 66 y.o.   MRN: [id number]  HPI Antonio Adams had his surgery for his back cancelled due to elevated serum glucose to the 400 range. He has been adherent to medical regimen of glipizide and metformin; adherent to diet. OVer the past month his CBGs have been on the rise. He has not been symptomatic in any way.  I have reviewed the patient's medical history in detail and updated the computerized patient record.      Review of Systems System review is negative for any constitutional, cardiac, pulmonary, GI or neuro symptoms or complaints other than as described in the HPI.     Objective:   Physical Exam Filed Vitals:   06/07/11 1312  BP: 150/88  Pulse: 88  Temp: 97.4 F (36.3 C)   Gen' - overweight AA man in no distress Cor- RRR Pulm - normal respirations.       Assessment & Plan:

## 2011-06-07 NOTE — Assessment & Plan Note (Addendum)
Loss of control.  Plan - Increase metformin to 1000 mg bid           D/c glipizide           Start onglyza 2.5 mg daily           Report back on CBGs: if better control but not at goal - increase onglyza to 5 mg. If no improvement in CBGs will consider basal insulin therapy which was discussed with the patient.  (greater than 50% of 25 min visit spent on education and counseling)

## 2011-06-22 ENCOUNTER — Other Ambulatory Visit: Payer: Self-pay | Admitting: Internal Medicine

## 2011-06-22 MED ORDER — SITAGLIPTIN PHOSPHATE 100 MG PO TABS: 100.0000 mg | ORAL_TABLET | Freq: Every day | ORAL | Status: DC

## 2011-06-22 NOTE — Progress Notes (Signed)
Based on home readings patient will be advised to stop glipizide and start januvia 100 mg daily, continue metformin

## 2011-06-23 ENCOUNTER — Telehealth: Payer: Self-pay | Admitting: *Deleted

## 2011-06-23 MED ORDER — SITAGLIPTIN PHOSPHATE 100 MG PO TABS: 100.0000 mg | ORAL_TABLET | Freq: Every day | ORAL | Status: DC

## 2011-06-23 NOTE — Telephone Encounter (Signed)
Adella Hare, MD 06/22/2011 6:18 PM Signed  Based on home readings patient will be advised to stop glipizide and start januvia 100 mg daily, continue metformin       RX to pharmacy; patient informed.

## 2011-06-27 ENCOUNTER — Ambulatory Visit (INDEPENDENT_AMBULATORY_CARE_PROVIDER_SITE_OTHER): Payer: Medicare Other | Admitting: Internal Medicine

## 2011-06-27 ENCOUNTER — Encounter: Payer: Self-pay | Admitting: Internal Medicine

## 2011-06-27 VITALS — BP 114/70 | HR 100 | Temp 98.3°F | Resp 16 | Wt 256.5 lb

## 2011-06-27 DIAGNOSIS — E119 Type 2 diabetes mellitus without complications: Secondary | ICD-10-CM

## 2011-06-27 DIAGNOSIS — J069 Acute upper respiratory infection, unspecified: Secondary | ICD-10-CM

## 2011-06-27 MED ORDER — AMOXICILLIN 875 MG PO TABS: 875.0000 mg | ORAL_TABLET | Freq: Two times a day (BID) | ORAL | Status: AC

## 2011-06-27 MED ORDER — PROMETHAZINE-CODEINE 6.25-10 MG/5ML PO SYRP: 5.0000 mL | ORAL_SOLUTION | ORAL | Status: AC | PRN

## 2011-06-27 NOTE — Patient Instructions (Signed)
Respiratory infection - minor but will nip it in the bud. Plan - amoxicillin 875 mg twice a day for a week; promethazine with codeine cough syrup - watch for drowsiness, tylenol for fever, hydrate, rest.    Upper Respiratory Infection, Adult An upper respiratory infection (URI) is also sometimes known as the common cold. The upper respiratory tract includes the nose, sinuses, throat, trachea, and bronchi. Bronchi are the airways leading to the lungs. Most people improve within 1 week, but symptoms can last up to 2 weeks. A residual cough may last even longer.   CAUSES Many different viruses can infect the tissues lining the upper respiratory tract. The tissues become irritated and inflamed and often become very moist. Mucus production is also common. A cold is contagious. You can easily spread the virus to others by oral contact. This includes kissing, sharing a glass, coughing, or sneezing. Touching your mouth or nose and then touching a surface, which is then touched by another person, can also spread the virus. SYMPTOMS   Symptoms typically develop 1 to 3 days after you come in contact with a cold virus. Symptoms vary from person to person. They may include:  Runny nose.     Sneezing.    Nasal congestion.     Sinus irritation.     Sore throat.     Loss of voice (laryngitis).     Cough.    Fatigue.    Muscle aches.     Loss of appetite.     Headache.    Low-grade fever.  DIAGNOSIS   You might diagnose your own cold based on familiar symptoms, since most people get a cold 2 to 3 times a year. Your caregiver can confirm this based on your exam. Most importantly, your caregiver can check that your symptoms are not due to another disease such as strep throat, sinusitis, pneumonia, asthma, or epiglottitis. Blood tests, throat tests, and X-rays are not necessary to diagnose a common cold, but they may sometimes be helpful in excluding other more serious diseases. Your caregiver will  decide if any further tests are required. RISKS AND COMPLICATIONS   You may be at risk for a more severe case of the common cold if you smoke cigarettes, have chronic heart disease (such as heart failure) or lung disease (such as asthma), or if you have a weakened immune system. The very young and very old are also at risk for more serious infections. Bacterial sinusitis, middle ear infections, and bacterial pneumonia can complicate the common cold. The common cold can worsen asthma and chronic obstructive pulmonary disease (COPD). Sometimes, these complications can require emergency medical care and may be life-threatening. PREVENTION   The best way to protect against getting a cold is to practice good hygiene. Avoid oral or hand contact with people with cold symptoms. Wash your hands often if contact occurs. There is no clear evidence that vitamin C, vitamin E, echinacea, or exercise reduces the chance of developing a cold. However, it is always recommended to get plenty of rest and practice good nutrition. TREATMENT   Treatment is directed at relieving symptoms. There is no cure. Antibiotics are not effective, because the infection is caused by a virus, not by bacteria. Treatment may include:  Increased fluid intake. Sports drinks offer valuable electrolytes, sugars, and fluids.     Breathing heated mist or steam (vaporizer or shower).     Eating chicken soup or other clear broths, and maintaining good nutrition.  Getting plenty of rest.     Using gargles or lozenges for comfort.     Controlling fevers with ibuprofen or acetaminophen as directed by your caregiver.     Increasing usage of your inhaler if you have asthma.  Zinc gel and zinc lozenges, taken in the first 24 hours of the common cold, can shorten the duration and lessen the severity of symptoms. Pain medicines may help with fever, muscle aches, and throat pain. A variety of non-prescription medicines are available to treat  congestion and runny nose. Your caregiver can make recommendations and may suggest nasal or lung inhalers for other symptoms.   HOME CARE INSTRUCTIONS    Only take over-the-counter or prescription medicines for pain, discomfort, or fever as directed by your caregiver.     Use a warm mist humidifier or inhale steam from a shower to increase air moisture. This may keep secretions moist and make it easier to breathe.     Drink enough water and fluids to keep your urine clear or pale yellow.     Rest as needed.     Return to work when your temperature has returned to normal or as your caregiver advises. You may need to stay home longer to avoid infecting others. You can also use a face mask and careful hand washing to prevent spread of the virus.  SEEK MEDICAL CARE IF:    After the first few days, you feel you are getting worse rather than better.     You need your caregiver's advice about medicines to control symptoms.     You develop chills, worsening shortness of breath, or brown or red sputum. These may be signs of pneumonia.     You develop yellow or brown nasal discharge or pain in the face, especially when you bend forward. These may be signs of sinusitis.     You develop a fever, swollen neck glands, pain with swallowing, or white areas in the back of your throat. These may be signs of strep throat.  SEEK IMMEDIATE MEDICAL CARE IF:    You have a fever.     You develop severe or persistent headache, ear pain, sinus pain, or chest pain.     You develop wheezing, a prolonged cough, cough up blood, or have a change in your usual mucus (if you have chronic lung disease).     You develop sore muscles or a stiff neck.  Document Released: 10/25/2000 Document Revised: 01/11/2011 Document Reviewed: 09/02/2010 [facility] Patient Information 2012 [place].

## 2011-06-27 NOTE — Progress Notes (Signed)
  Subjective:    Patient ID: Antonio Adams, male    DOB: 07-04-1945, 66 y.o.   MRN: [id number]  HPI Antonio Adams presents for URI - sinus congestion, rhinorrhea, cough productive of thick mucus. No documented fever. No SOB/DOE.  I have reviewed the patient's medical history in detail and updated the computerized patient record.   Review of Systems System review is negative for any constitutional, cardiac, pulmonary, GI or neuro symptoms or complaints other than as described in the HPI.     Objective:   Physical Exam Filed Vitals:   06/27/11 1328  BP: 114/70  Pulse: 100  Temp: 98.3 F (36.8 C)  Resp: 16   Gen'l- heavy-set AA man in no distress HEENT- minor tenderness maxillary sinus Nodes - negative Pulm - lungs CTAP Cor - RRR       Assessment & Plan:  URI - mild to moderate, diabetic at greater risk  Plan - amoxicillin 875 mg BID x 7           Prom/cod 1 tsp q 4 prn           Supportive care

## 2011-08-21 ENCOUNTER — Telehealth: Payer: Self-pay | Admitting: Internal Medicine

## 2011-08-21 ENCOUNTER — Other Ambulatory Visit (INDEPENDENT_AMBULATORY_CARE_PROVIDER_SITE_OTHER): Payer: Medicare Other

## 2011-08-21 DIAGNOSIS — E785 Hyperlipidemia, unspecified: Secondary | ICD-10-CM

## 2011-08-21 DIAGNOSIS — E119 Type 2 diabetes mellitus without complications: Secondary | ICD-10-CM

## 2011-08-21 LAB — LIPID PANEL
Cholesterol: 158 mg/dL (ref 0–200)
LDL Cholesterol: 104 mg/dL — ABNORMAL HIGH (ref 0–99)
Total CHOL/HDL Ratio: 4
VLDL: 13.4 mg/dL (ref 0.0–40.0)

## 2011-08-21 LAB — HEPATIC FUNCTION PANEL
Bilirubin, Direct: 0 mg/dL (ref 0.0–0.3)
Total Protein: 6.9 g/dL (ref 6.0–8.3)

## 2011-08-21 NOTE — Telephone Encounter (Signed)
lvmom for patient to call back and gave info about lab work on vm

## 2011-08-21 NOTE — Telephone Encounter (Signed)
Message copied by Richrd Sox on Mon Aug 21, 2011  8:11 AM ------      Message from: Neena Rhymes      Created: Fri Aug 18, 2011 12:04 PM       Mr. Luginbill needs to be on my schedule for Tuesday - preop clearance. He needs to come in Monday for Bmet and A1C (orders entered).            THANKS

## 2011-08-22 ENCOUNTER — Encounter: Payer: Self-pay | Admitting: Internal Medicine

## 2011-08-22 ENCOUNTER — Ambulatory Visit: Payer: Medicare Other

## 2011-08-22 ENCOUNTER — Ambulatory Visit (INDEPENDENT_AMBULATORY_CARE_PROVIDER_SITE_OTHER): Payer: Medicare Other | Admitting: Internal Medicine

## 2011-08-22 VITALS — BP 120/72 | HR 83 | Temp 97.5°F | Resp 18 | Wt 250.2 lb

## 2011-08-22 DIAGNOSIS — M5126 Other intervertebral disc displacement, lumbar region: Secondary | ICD-10-CM

## 2011-08-22 DIAGNOSIS — E119 Type 2 diabetes mellitus without complications: Secondary | ICD-10-CM

## 2011-08-22 DIAGNOSIS — I10 Essential (primary) hypertension: Secondary | ICD-10-CM

## 2011-08-22 DIAGNOSIS — E669 Obesity, unspecified: Secondary | ICD-10-CM

## 2011-08-22 DIAGNOSIS — E785 Hyperlipidemia, unspecified: Secondary | ICD-10-CM

## 2011-08-22 LAB — HEMOGLOBIN A1C
Hgb A1c MFr Bld: 8.1 % — ABNORMAL HIGH (ref <5.7)
Mean Plasma Glucose: 186 mg/dL — ABNORMAL HIGH (ref <117)

## 2011-08-23 ENCOUNTER — Telehealth: Payer: Self-pay | Admitting: *Deleted

## 2011-08-23 NOTE — Telephone Encounter (Signed)
Message copied by Rockwell Germany on Wed Aug 23, 2011  1:51 PM ------      Message from: Neena Rhymes      Created: Tue Aug 22, 2011 11:43 PM       Please call patient - A1C 8.1 % - better than previous but still high. To be prepared for surgery - recommend starting levmir 20 units qHS and he will need to do a 3 day cycle titration: check CBG qAM fasting: if greater than 130 3 days in a row increase levmir by 3 units. Repeat cycle until controlled.            He needs to come in to meet with nurse for teaching in use of levmir pen and to go over the titration cycle. If you do not feel comfortable and capable in doing this he may need to see MD or Jilda. You can call me on an as needed basis Thursday AM between 10 and 11 EDT            OV 4/22 for follow-up and then surgical clearance.

## 2011-08-23 NOTE — Assessment & Plan Note (Signed)
BP Readings from Last 3 Encounters:  08/22/11 120/72  06/27/11 114/70  06/07/11 150/88   Good control

## 2011-08-23 NOTE — Assessment & Plan Note (Signed)
Recommend holding surgery until he has better control of blood sugar

## 2011-08-23 NOTE — Telephone Encounter (Signed)
Spoke w/patient and scheduled Nurse visit for Monday 04.15.13 at 2:30pm & F/U for Sx Clearance OV for Tuesday 04.23.13 at 3:00pm.

## 2011-08-23 NOTE — Assessment & Plan Note (Signed)
A1C remains elevated above goal. At this point he would be at risk for decreased healing from major back surgery.  Plan - continue life-style management with better adherence            Continue metformin and januvia            Add basal insulin with Levemir, starting at 20 units qHS with 3 day cycle titrations

## 2011-08-23 NOTE — Progress Notes (Signed)
Subjective:    Patient ID: Antonio Adams, male    DOB: [birth year], 66 y.o.   MRN: [id number]  HPI Antonio Adams has sciatica confirmed by MRI. He was scheduled for surgery in Jan '13 but was cancelled due to out of control diabetes with markedly elevated A1C. He was subsequently started on Januvia and metformin along with life-style management. He returns for follow-up, needing to be cleared for surgery. He reports that he tries to adhere to a diabetic diet, that he dose take his medications and that his CBGs have been much better.  Past Medical History  Diagnosis Date  . DIABETES MELLITUS, TYPE II 03/16/2007  . HYPERLIPIDEMIA 03/16/2007  . CARPAL TUNNEL SYNDROME, BILATERAL 03/16/2007  . HYPERTENSION 03/16/2007  . ALLERGIC RHINITIS 03/16/2007  . GERD 03/16/2007  . BACK PAIN WITH RADICULOPATHY 11/23/2008  . LEG CRAMPS 05/22/2007  . PARESTHESIA 05/21/2007  . GASTROINTESTINAL HEMORRHAGE, HX OF 03/16/2007  . ADENOCARCINOMA, PROSTATE, GLEASON GRADE 6 12/21/2009  . Sleep apnea   . Overactive bladder    Past Surgical History  Procedure Date  . Back surgery 1983, 2008    repeat surgery; ESI '08  . Shoulder surgery   . Carpal tunnel release     bilateral  . Cardiac catheterization 06/28/2010    non-obstructive disease  . Stress cardiolite 12/24/2003    negative for ischemia  . Wound exploration     management of a wound that he sustained in the [employer]  . Prostate surgery march 2012    seed implant  . Cervical fusion    Family History  Problem Relation Age of Onset  . Cancer Sister 30    Cancer  . Hypertension Other   . Colon cancer Neg Hx   . Esophageal cancer Neg Hx   . Stomach cancer Neg Hx    History   Social History  . Marital Status: Married    Spouse Name: N/A    Number of Children: 2  . Years of Education: 12+   Occupational History  . Event organiser; Curator     retired from Event organiser   Social History Main Topics  . Smoking status: Former Research scientist (life sciences)  .  Smokeless tobacco: Never Used  . Alcohol Use: No  . Drug Use: No  . Sexually Active: Yes -- Male partner(s)   Other Topics Concern  . Not on file   Social History Narrative   Married-divorced '89, remarried '96. 2 sons, 1 daughter. 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 He wishes to be a full resuscitation candidate.     Current Outpatient Prescriptions on File Prior to Visit  Medication Sig Dispense Refill  . aspirin 325 MG tablet Take 325 mg by mouth daily.        . Cholecalciferol 1000 UNITS capsule Take 1,000 Units by mouth daily.        Marland Kitchen diltiazem (DILACOR XR) 120 MG 24 hr capsule Take 120 mg by mouth daily.        . fesoterodine (TOVIAZ) 4 MG TB24 Take 4 mg by mouth daily.      Marland Kitchen gabapentin (NEURONTIN) 100 MG capsule Take 100 mg by mouth at bedtime.      Marland Kitchen gemfibrozil (LOPID) 600 MG tablet Take 600 mg by mouth 2 (two) times daily.       Marland Kitchen ibuprofen (ADVIL,MOTRIN) 800 MG tablet Take 800 mg by mouth every 8 (eight) hours as needed. For pain      .  lisinopril (PRINIVIL,ZESTRIL) 40 MG tablet Take 20 mg by mouth daily.       . metFORMIN (GLUCOPHAGE) 1000 MG tablet Take 1 tablet (1,000 mg total) by mouth 2 (two) times daily with a meal.  60 tablet  1  . Multiple Vitamin (MULTIVITAMIN) tablet Take 1 tablet by mouth daily.        . OMEGA 3 1200 MG CAPS Take 1,200 mg by mouth daily.      Marland Kitchen omeprazole (PRILOSEC) 20 MG capsule Take 20 mg by mouth daily.        . sitaGLIPtin (JANUVIA) 100 MG tablet Take 1 tablet (100 mg total) by mouth daily.  30 tablet  5   Current Facility-Administered Medications on File Prior to Visit  Medication Dose Route Frequency Provider Last Rate Last Dose  . 0.9 %  sodium chloride infusion  500 mL Intravenous Continuous Irene Shipper, MD          Review of Systems System review is negative for any constitutional, cardiac, pulmonary, GI or neuro symptoms or complaints other than as described in the  HPI. He does continue to suffere with sciatica.     Objective:   Physical Exam Filed Vitals:   08/22/11 1040  BP: 120/72  Pulse: 83  Temp: 97.5 F (36.4 C)  Resp: 18   Wt Readings from Last 3 Encounters:  08/22/11 250 lb 4 oz (113.513 kg)  06/27/11 256 lb 8 oz (116.348 kg)  06/07/11 263 lb (119.296 kg)   Gen'l - large framed heavy-set AA man in no distress Pulm- normal respirations Neuro - A&O x 3, normal MS, normal gait  Lab Results  Component Value Date   WBC 3.1* 05/25/2011   HGB 11.1* 05/25/2011   HCT 31.2* 05/25/2011   PLT 202 05/25/2011   GLUCOSE 458* 05/25/2011   CHOL 158 08/21/2011   TRIG 67.0 08/21/2011   HDL 41.00 08/21/2011   LDLCALC 104* 08/21/2011   ALT 15 08/21/2011   AST 17 08/21/2011   NA 134* 05/25/2011   K 5.1 05/25/2011   CL 100 05/25/2011   CREATININE 1.24 05/25/2011   BUN 31* 05/25/2011   CO2 23 05/25/2011   TSH 0.85 11/11/2010   PSA 0.06* 11/11/2010   INR 1.02 07/14/2010   HGBA1C 8.1* 08/22/2011          Assessment & Plan:

## 2011-08-23 NOTE — Assessment & Plan Note (Signed)
Lab Results  Component Value Date   CHOL 158 08/21/2011   HDL 41.00 08/21/2011   LDLCALC 104* 08/21/2011   TRIG 67.0 08/21/2011   CHOLHDL 4 08/21/2011   Great control and at goal.

## 2011-08-23 NOTE — Assessment & Plan Note (Signed)
He has lost 13 lbs since [employer]

## 2011-08-28 ENCOUNTER — Ambulatory Visit (INDEPENDENT_AMBULATORY_CARE_PROVIDER_SITE_OTHER): Payer: Medicare Other | Admitting: *Deleted

## 2011-08-28 DIAGNOSIS — E119 Type 2 diabetes mellitus without complications: Secondary | ICD-10-CM

## 2011-08-29 ENCOUNTER — Encounter: Payer: Self-pay | Admitting: Internal Medicine

## 2011-09-04 ENCOUNTER — Ambulatory Visit (INDEPENDENT_AMBULATORY_CARE_PROVIDER_SITE_OTHER): Payer: Medicare Other | Admitting: Internal Medicine

## 2011-09-04 ENCOUNTER — Encounter: Payer: Self-pay | Admitting: Internal Medicine

## 2011-09-04 VITALS — BP 100/60 | HR 90 | Temp 97.6°F | Resp 16 | Wt 249.0 lb

## 2011-09-04 DIAGNOSIS — E119 Type 2 diabetes mellitus without complications: Secondary | ICD-10-CM

## 2011-09-04 MED ORDER — INSULIN PEN NEEDLE 31G X 8 MM MISC: Status: DC

## 2011-09-04 MED ORDER — INSULIN GLARGINE 100 UNIT/ML ~~LOC~~ SOLN: 20.0000 [IU] | Freq: Every day | SUBCUTANEOUS | Status: DC

## 2011-09-05 ENCOUNTER — Ambulatory Visit: Payer: Medicare Other

## 2011-09-05 ENCOUNTER — Ambulatory Visit: Payer: Medicare Other | Admitting: Internal Medicine

## 2011-09-05 NOTE — Assessment & Plan Note (Signed)
Lab Results  Component Value Date   HGBA1C 8.1* 08/22/2011   Patient is doing much better with excellent control per home CBG record. At this point he is cleared for spinal surgery. He will require perioperative sliding scale management with which I will be happy to assist.

## 2011-09-05 NOTE — Progress Notes (Signed)
Subjective:    Patient ID: Antonio Adams, male    DOB: 06-Jul-1945, 66 y.o.   MRN: [id number]  HPI Antonio Adams returns for diabetes follow-up. He has been adhering to a no sugar low carb diet, he has continue his oral medications and has done well with basal insulin therapy. Home CBG reports are excellent: range of 86 to 125 fasting AM samples.  Past Medical History  Diagnosis Date  . DIABETES MELLITUS, TYPE II 03/16/2007  . HYPERLIPIDEMIA 03/16/2007  . CARPAL TUNNEL SYNDROME, BILATERAL 03/16/2007  . HYPERTENSION 03/16/2007  . ALLERGIC RHINITIS 03/16/2007  . GERD 03/16/2007  . BACK PAIN WITH RADICULOPATHY 11/23/2008  . LEG CRAMPS 05/22/2007  . PARESTHESIA 05/21/2007  . GASTROINTESTINAL HEMORRHAGE, HX OF 03/16/2007  . ADENOCARCINOMA, PROSTATE, GLEASON GRADE 6 12/21/2009  . Sleep apnea   . Overactive bladder    Past Surgical History  Procedure Date  . Back surgery 1983, 2008    repeat surgery; ESI '08  . Shoulder surgery   . Carpal tunnel release     bilateral  . Cardiac catheterization 06/28/2010    non-obstructive disease  . Stress cardiolite 12/24/2003    negative for ischemia  . Wound exploration     management of a wound that he sustained in the [employer]  . Prostate surgery march 2012    seed implant  . Cervical fusion    Family History  Problem Relation Age of Onset  . Cancer Sister 30    Cancer  . Hypertension Other   . Colon cancer Neg Hx   . Esophageal cancer Neg Hx   . Stomach cancer Neg Hx    History   Social History  . Marital Status: Married    Spouse Name: N/A    Number of Children: 2  . Years of Education: 12+   Occupational History  . Event organiser; Curator     retired from Event organiser   Social History Main Topics  . Smoking status: Former Research scientist (life sciences)  . Smokeless tobacco: Never Used  . Alcohol Use: No  . Drug Use: No  . Sexually Active: Yes -- Male partner(s)   Other Topics Concern  . Not on file   Social History Narrative   Married-divorced '89, remarried '96. 2 sons, 1 daughter. 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 He wishes to be a full resuscitation candidate.    Current Outpatient Prescriptions on File Prior to Visit  Medication Sig Dispense Refill  . aspirin 325 MG tablet Take 325 mg by mouth daily.        . Cholecalciferol 1000 UNITS capsule Take 1,000 Units by mouth daily.        Marland Kitchen diltiazem (DILACOR XR) 120 MG 24 hr capsule Take 120 mg by mouth daily.        . fesoterodine (TOVIAZ) 4 MG TB24 Take 4 mg by mouth daily.      Marland Kitchen gabapentin (NEURONTIN) 100 MG capsule Take 100 mg by mouth at bedtime.      Marland Kitchen gemfibrozil (LOPID) 600 MG tablet Take 600 mg by mouth 2 (two) times daily.       Marland Kitchen ibuprofen (ADVIL,MOTRIN) 800 MG tablet Take 800 mg by mouth every 8 (eight) hours as needed. For pain      . lisinopril (PRINIVIL,ZESTRIL) 40 MG tablet Take 20 mg by mouth daily.       . metFORMIN (GLUCOPHAGE) 1000 MG tablet Take 1 tablet (1,000 mg total)  by mouth 2 (two) times daily with a meal.  60 tablet  1  . Multiple Vitamin (MULTIVITAMIN) tablet Take 1 tablet by mouth daily.        . OMEGA 3 1200 MG CAPS Take 1,200 mg by mouth daily.      Marland Kitchen omeprazole (PRILOSEC) 20 MG capsule Take 20 mg by mouth daily.        . sitaGLIPtin (JANUVIA) 100 MG tablet Take 1 tablet (100 mg total) by mouth daily.  30 tablet  5  . insulin glargine (LANTUS SOLOSTAR) 100 UNIT/ML injection Inject 20 Units into the skin at bedtime.  2 pen  11       Review of Systems System review is negative for any constitutional, cardiac, pulmonary, GI or neuro symptoms or complaints other than as described in the HPI.     Objective:   Physical Exam Filed Vitals:   09/04/11 1515  BP: 100/60  Pulse: 90  Temp: 97.6 F (36.4 C)  Resp: 16   Gen'l- heavy set AA man in no acute distress Cor - RRR Pulm - normal respirations       Assessment & Plan:

## 2011-09-28 ENCOUNTER — Encounter: Payer: Self-pay | Admitting: Internal Medicine

## 2011-09-28 ENCOUNTER — Ambulatory Visit (INDEPENDENT_AMBULATORY_CARE_PROVIDER_SITE_OTHER): Payer: Medicare Other | Admitting: Internal Medicine

## 2011-09-28 VITALS — BP 136/72 | HR 68 | Ht 72.0 in | Wt 240.0 lb

## 2011-09-28 DIAGNOSIS — D3A098 Benign carcinoid tumors of other sites: Secondary | ICD-10-CM

## 2011-09-28 DIAGNOSIS — Z86010 Personal history of colonic polyps: Secondary | ICD-10-CM

## 2011-09-28 DIAGNOSIS — K219 Gastro-esophageal reflux disease without esophagitis: Secondary | ICD-10-CM

## 2011-09-28 DIAGNOSIS — E119 Type 2 diabetes mellitus without complications: Secondary | ICD-10-CM

## 2011-09-28 DIAGNOSIS — D490 Neoplasm of unspecified behavior of digestive system: Secondary | ICD-10-CM

## 2011-09-28 DIAGNOSIS — K625 Hemorrhage of anus and rectum: Secondary | ICD-10-CM

## 2011-09-28 MED ORDER — MOVIPREP 100 G PO SOLR: 1.0000 | Freq: Once | ORAL | Status: DC

## 2011-09-28 NOTE — Progress Notes (Signed)
HISTORY OF PRESENT ILLNESS:  Antonio Adams is a 66 y.o. male with diabetes mellitus, hypertension, hyperlipidemia, sleep apnea, and prostate cancer. He is sent today regarding rectal bleeding. He was diagnosed with adenocarcinoma of the prostate in October of 2011. He was subsequently treated with seed implantation and external beam radiation. He was seen by his urologist 08/29/2011 (records reviewed) for routine followup. At that time he mentioned episodes of rectal bleeding. Rectal examination (reviewed) was unremarkable. He is referred. Review of outside laboratories from April show normal chemistries. Hemoglobin in January was 11.1 with a normal MCV. The patient reports to me 5-6 episodes of fairly pronounced red blood per rectum associated with bowel movements. Blood was in the toilet bowl as well as bowel movement and tissue. There was no associated rectal pain. No associated abdominal pain. He has had no bleeding in the past 3 weeks. He does have a tendency toward constipation. He has had some weight loss, though cannot quantify. Of importance, patient underwent complete colonoscopy and upper endoscopy in August of 2012. Complete colonoscopy was performed for adenoma surveillance. Examination was normal except for diminutive A. Sending non-adenomatous polypoid lesion. No mention of hemorrhoids or radiation proctitis. Upper endoscopy was performed for followup of a duodenal carcinoid diagnosed in 2002. The small lesion was stable. Relook colonoscopy and upper endoscopy in 5 years recommended.  REVIEW OF SYSTEMS:  All non-GI ROS negative except for sinus an allergy trouble, back pain, fatigue, muscle cramps in the legs, night sweats, sleeping problems, ankle edema  Past Medical History  Diagnosis Date  . DIABETES MELLITUS, TYPE II 03/16/2007  . HYPERLIPIDEMIA 03/16/2007  . CARPAL TUNNEL SYNDROME, BILATERAL 03/16/2007  . HYPERTENSION 03/16/2007  . ALLERGIC RHINITIS 03/16/2007  . GERD 03/16/2007    . BACK PAIN WITH RADICULOPATHY 11/23/2008  . LEG CRAMPS 05/22/2007  . PARESTHESIA 05/21/2007  . GASTROINTESTINAL HEMORRHAGE, HX OF 03/16/2007  . ADENOCARCINOMA, PROSTATE, GLEASON GRADE 6 12/21/2009  . Sleep apnea   . Overactive bladder   . Hemorrhoids     Past Surgical History  Procedure Date  . Back surgery 1983, 2008    repeat surgery; ESI '08  . Shoulder surgery   . Carpal tunnel release     bilateral  . Cardiac catheterization 06/28/2010    non-obstructive disease  . Stress cardiolite 12/24/2003    negative for ischemia  . Wound exploration     management of a wound that he sustained in the [employer]  . Prostate surgery march 2012    seed implant  . Cervical fusion     Social History Antonio Adams  reports that he has never smoked. He has never used smokeless tobacco. He reports that he does not drink alcohol or use illicit drugs.  family history includes Cancer (age of onset:30) in his sister and Hypertension in his other.  There is no history of Colon cancer, and Esophageal cancer, and Stomach cancer, .  No Known Allergies     PHYSICAL EXAMINATION: Vital signs: BP 136/72  Pulse 68  Ht 6' (1.829 m)  Wt 240 lb (108.863 kg)  BMI 32.55 kg/m2  Constitutional: generally well-appearing, no acute distress Psychiatric: alert and oriented x3, cooperative Eyes: extraocular movements intact, anicteric, conjunctiva pink Mouth: oral pharynx moist, no lesions Neck: supple no lymphadenopathy Cardiovascular: heart regular rate and rhythm, no murmur Lungs: clear to auscultation bilaterally Abdomen: soft, obese, nontender, nondistended, no obvious ascites, no peritoneal signs, normal bowel sounds, no organomegaly Rectal:deferred until colonoscopy. Negative recently per urologist  Extremities: no lower extremity edema bilaterally Skin: no lesions on visible extremities Neuro: No focal deficits.   ASSESSMENT:  #1. New onset rectal bleeding. Etiology uncertain. Possibly has  developed interval radiation proctitis #2. History of adenomatous colon polyps. Last colonoscopy August 2012 #3. Small stable duodenal carcinoid. Last upper endoscopy August 2012 #4. GERD. Stable on Prilosec #5. Multiple medical problems including insulin requiring diabetes mellitus and sleep apnea   PLAN:  #1. Colonoscopy. Patient is at high risk given his sleep apnea. CRNA monitored propofol demonstration planned.The nature of the procedure, as well as the risks, benefits, and alternatives were carefully and thoroughly reviewed with the patient. Ample time for discussion and questions allowed. The patient understood, was satisfied, and agreed to proceed.  #2. Movi prep prescribed. The patient instructed on its use #3. Hold diabetic medications the morning of the procedure until normal oral intake resumed. Monitor blood sugar mealy before and after the procedure #4. Continue reflux precautions #5. Continue PPI

## 2011-09-28 NOTE — Patient Instructions (Signed)
You have been scheduled for a colonoscopy with propofol. Please follow written instructions given to you at your visit today.  Please pick up your prep kit at the pharmacy within the next 1-3 days.  

## 2011-09-29 ENCOUNTER — Encounter: Payer: Self-pay | Admitting: Internal Medicine

## 2011-10-12 ENCOUNTER — Emergency Department (HOSPITAL_BASED_OUTPATIENT_CLINIC_OR_DEPARTMENT_OTHER)
Admission: EM | Admit: 2011-10-12 | Discharge: 2011-10-12 | Disposition: A | Discharge status: Home / Self Care | Payer: Medicare Other | Attending: Emergency Medicine | Admitting: Emergency Medicine

## 2011-10-12 ENCOUNTER — Encounter (HOSPITAL_BASED_OUTPATIENT_CLINIC_OR_DEPARTMENT_OTHER): Payer: Self-pay | Admitting: *Deleted

## 2011-10-12 DIAGNOSIS — K219 Gastro-esophageal reflux disease without esophagitis: Secondary | ICD-10-CM | POA: Insufficient documentation

## 2011-10-12 DIAGNOSIS — R42 Dizziness and giddiness: Secondary | ICD-10-CM | POA: Insufficient documentation

## 2011-10-12 DIAGNOSIS — E162 Hypoglycemia, unspecified: Secondary | ICD-10-CM

## 2011-10-12 DIAGNOSIS — M545 Low back pain, unspecified: Secondary | ICD-10-CM | POA: Insufficient documentation

## 2011-10-12 DIAGNOSIS — I10 Essential (primary) hypertension: Secondary | ICD-10-CM | POA: Insufficient documentation

## 2011-10-12 DIAGNOSIS — E785 Hyperlipidemia, unspecified: Secondary | ICD-10-CM | POA: Insufficient documentation

## 2011-10-12 DIAGNOSIS — Z794 Long term (current) use of insulin: Secondary | ICD-10-CM | POA: Insufficient documentation

## 2011-10-12 DIAGNOSIS — Z79899 Other long term (current) drug therapy: Secondary | ICD-10-CM | POA: Insufficient documentation

## 2011-10-12 DIAGNOSIS — Z7982 Long term (current) use of aspirin: Secondary | ICD-10-CM | POA: Insufficient documentation

## 2011-10-12 DIAGNOSIS — R350 Frequency of micturition: Secondary | ICD-10-CM | POA: Insufficient documentation

## 2011-10-12 DIAGNOSIS — R5381 Other malaise: Secondary | ICD-10-CM | POA: Insufficient documentation

## 2011-10-12 DIAGNOSIS — R3915 Urgency of urination: Secondary | ICD-10-CM | POA: Insufficient documentation

## 2011-10-12 LAB — COMPREHENSIVE METABOLIC PANEL
ALT: 15 U/L (ref 0–53)
AST: 18 U/L (ref 0–37)
Calcium: 10.4 mg/dL (ref 8.4–10.5)
Potassium: 4.4 mEq/L (ref 3.5–5.1)
Sodium: 139 mEq/L (ref 135–145)
Total Protein: 7.6 g/dL (ref 6.0–8.3)

## 2011-10-12 LAB — CARDIAC PANEL(CRET KIN+CKTOT+MB+TROPI)
CK, MB: 2.9 ng/mL (ref 0.3–4.0)
Relative Index: 1.5 (ref 0.0–2.5)
Troponin I: <0.3 ng/mL (ref <0.30)

## 2011-10-12 LAB — GLUCOSE, CAPILLARY
Glucose-Capillary: 51 mg/dL — ABNORMAL LOW (ref 70–99)
Glucose-Capillary: 104 mg/dL — ABNORMAL HIGH (ref 70–99)

## 2011-10-12 LAB — URINALYSIS, ROUTINE W REFLEX MICROSCOPIC
Bilirubin Urine: NEGATIVE
Glucose, UA: NEGATIVE mg/dL
Hgb urine dipstick: NEGATIVE
Specific Gravity, Urine: 1.021 (ref 1.005–1.030)
pH: 5.5 (ref 5.0–8.0)

## 2011-10-12 LAB — CBC
MCV: 85 fL (ref 78.0–100.0)
Platelets: 235 10*3/uL (ref 150–400)
RDW: 14.9 % (ref 11.5–15.5)
WBC: 5.7 10*3/uL (ref 4.0–10.5)

## 2011-10-12 LAB — DIFFERENTIAL
Basophils Absolute: 0 10*3/uL (ref 0.0–0.1)
Eosinophils Absolute: 0.1 10*3/uL (ref 0.0–0.7)
Eosinophils Relative: 1 % (ref 0–5)
Lymphocytes Relative: 8 % — ABNORMAL LOW (ref 12–46)
Neutrophils Relative %: 81 % — ABNORMAL HIGH (ref 43–77)

## 2011-10-12 MED ORDER — DEXTROSE 50 % IV SOLN
1.0000 | Freq: Once | INTRAVENOUS | Status: AC
  Administered 2011-10-12: 50 mL via INTRAVENOUS
  Filled 2011-10-12: qty 50

## 2011-10-12 NOTE — ED Notes (Signed)
Explained to pt. That he will be watched for another hour and if his blood sugar stays consistant he will be discharged.

## 2011-10-12 NOTE — ED Notes (Signed)
CBG 51

## 2011-10-12 NOTE — ED Notes (Signed)
Apple juice given as oredered. Pt awake and alert. Answers questions appropriately. Skin is warm and dry

## 2011-10-12 NOTE — ED Notes (Signed)
Pt c/o " not feeling well", dizzy and fatigue x 1 day

## 2011-10-12 NOTE — ED Notes (Signed)
Blood sugar: 41

## 2011-10-12 NOTE — ED Notes (Signed)
cbg 104

## 2011-10-12 NOTE — ED Notes (Signed)
EMT getting blood sugar on pt.

## 2011-10-12 NOTE — ED Provider Notes (Addendum)
History     CSN: [id number]  Arrival date & time 10/12/11  90   First MD Initiated Contact with Patient 10/12/11 1811      Chief Complaint  Patient presents with  . Dizziness  . Fatigue    (Consider location/radiation/quality/duration/timing/severity/associated sxs/prior treatment) Patient is a 66 y.o. male presenting with weakness. The history is provided by the patient and the spouse.  Weakness The primary symptoms include dizziness. Primary symptoms do not include loss of consciousness, visual change, focal weakness, loss of sensation, speech change, fever, nausea or vomiting. The symptoms began 12 to 24 hours ago. The symptoms are unchanged. The neurological symptoms are diffuse. Context: constantly and did not change despite eating.  Dizziness also occurs with weakness. Dizziness does not occur with nausea or vomiting.  Additional symptoms include weakness. Additional symptoms do not include leg pain, loss of balance, photophobia, aura or hallucinations. Medical issues also include diabetes. Medical issues do not include seizures or cerebral vascular accident.    Past Medical History  Diagnosis Date  . DIABETES MELLITUS, TYPE II 03/16/2007  . HYPERLIPIDEMIA 03/16/2007  . CARPAL TUNNEL SYNDROME, BILATERAL 03/16/2007  . HYPERTENSION 03/16/2007  . ALLERGIC RHINITIS 03/16/2007  . GERD 03/16/2007  . BACK PAIN WITH RADICULOPATHY 11/23/2008  . LEG CRAMPS 05/22/2007  . PARESTHESIA 05/21/2007  . GASTROINTESTINAL HEMORRHAGE, HX OF 03/16/2007  . ADENOCARCINOMA, PROSTATE, GLEASON GRADE 6 12/21/2009  . Sleep apnea   . Overactive bladder   . Hemorrhoids     Past Surgical History  Procedure Date  . Back surgery 1983, 2008    repeat surgery; ESI '08  . Shoulder surgery   . Carpal tunnel release     bilateral  . Cardiac catheterization 06/28/2010    non-obstructive disease  . Stress cardiolite 12/24/2003    negative for ischemia  . Wound exploration     management of a wound that he  sustained in the [employer]  . Prostate surgery march 2012    seed implant  . Cervical fusion     Family History  Problem Relation Age of Onset  . Cancer Sister 30    Cancer  . Hypertension Other   . Colon cancer Neg Hx   . Esophageal cancer Neg Hx   . Stomach cancer Neg Hx     History  Substance Use Topics  . Smoking status: Never Smoker   . Smokeless tobacco: Never Used  . Alcohol Use: No      Review of Systems  Constitutional: Negative for fever and chills.  Eyes: Negative for photophobia and pain.  Respiratory: Negative for cough and shortness of breath.   Gastrointestinal: Negative for nausea and vomiting.  Genitourinary: Positive for urgency and frequency. Negative for dysuria.  Neurological: Positive for dizziness and weakness. Negative for speech change, focal weakness, loss of consciousness, syncope, speech difficulty, numbness and loss of balance.  Psychiatric/Behavioral: Negative for hallucinations.  All other systems reviewed and are negative.    Allergies  Review of patient's allergies indicates no known allergies.  Home Medications   Current Outpatient Rx  Name Route Sig Dispense Refill  . ASPIRIN 325 MG PO TABS Oral Take 325 mg by mouth daily.      . CHOLECALCIFEROL 1000 UNITS PO CAPS Oral Take 1,000 Units by mouth daily.      Marland Kitchen DILTIAZEM HCL 120 MG PO CP24 Oral Take 120 mg by mouth daily.      . FESOTERODINE FUMARATE ER 4 MG PO TB24 Oral Take 4 mg  by mouth daily.    Marland Kitchen GABAPENTIN 100 MG PO CAPS Oral Take 100 mg by mouth at bedtime.    Marland Kitchen GEMFIBROZIL 600 MG PO TABS Oral Take 600 mg by mouth 2 (two) times daily.     . INSULIN GLARGINE 100 UNIT/ML  SOLN Subcutaneous Inject 20 Units into the skin at bedtime. 2 pen 11  . INSULIN PEN NEEDLE 31G X 8 MM MISC  Use needle for 2-3 administrations 30 each 11  . LISINOPRIL 40 MG PO TABS Oral Take 20 mg by mouth daily.     Marland Kitchen METFORMIN HCL 1000 MG PO TABS Oral Take 1 tablet (1,000 mg total) by mouth 2 (two) times  daily with a meal. 60 tablet 1  . ONE-DAILY MULTI VITAMINS PO TABS Oral Take 1 tablet by mouth daily.      . OMEGA 3 1200 MG PO CAPS Oral Take 1,200 mg by mouth daily.    Marland Kitchen OMEPRAZOLE 20 MG PO CPDR Oral Take 20 mg by mouth daily.      Marland Kitchen SITAGLIPTIN PHOSPHATE 100 MG PO TABS Oral Take 1 tablet (100 mg total) by mouth daily. 30 tablet 5    BP 111/65  Pulse 75  Temp(Src) 97.5 F (36.4 C) (Oral)  Resp 16  Ht 6' (1.829 m)  Wt 235 lb (106.595 kg)  BMI 31.87 kg/m2  SpO2 100%  Physical Exam  Nursing note and vitals reviewed. Constitutional: He is oriented to person, place, and time. He appears well-developed and well-nourished. No distress.  HENT:  Head: Normocephalic and atraumatic.  Mouth/Throat: Oropharynx is clear and moist.  Eyes: Conjunctivae and EOM are normal. Pupils are equal, round, and reactive to light.  Neck: Normal range of motion. Neck supple. No spinous process tenderness and no muscular tenderness present. No rigidity. Normal range of motion present.  Cardiovascular: Normal rate, regular rhythm, normal heart sounds and intact distal pulses.   No murmur heard. Pulmonary/Chest: Effort normal and breath sounds normal. No respiratory distress. He has no wheezes. He has no rales.  Abdominal: Soft. He exhibits no distension. There is no tenderness. There is no rebound, no guarding and no CVA tenderness.  Musculoskeletal: He exhibits no edema and no tenderness.       Lumbar back: He exhibits decreased range of motion, tenderness and pain. He exhibits no swelling, no deformity and normal pulse.  Neurological: He is alert and oriented to person, place, and time. Coordination normal.  Reflex Scores:      Patellar reflexes are 1+ on the right side and 1+ on the left side. Skin: Skin is warm and dry. No rash noted. No erythema.  Psychiatric: He has a normal mood and affect. His behavior is normal.    ED Course  Procedures (including critical care time)  Labs Reviewed  GLUCOSE,  CAPILLARY - Abnormal; Notable for the following:    Glucose-Capillary 41 (*)    All other components within normal limits  CBC - Abnormal; Notable for the following:    RBC 3.80 (*)    Hemoglobin 11.0 (*)    HCT 32.3 (*)    All other components within normal limits  DIFFERENTIAL - Abnormal; Notable for the following:    Neutrophils Relative 81 (*)    Lymphocytes Relative 8 (*)    Lymphs Abs 0.5 (*)    All other components within normal limits  COMPREHENSIVE METABOLIC PANEL - Abnormal; Notable for the following:    Glucose, Bld 54 (*)    BUN 25 (*)  Total Bilirubin 0.1 (*)    GFR calc non Af Amer 56 (*)    GFR calc Af Amer 65 (*)    All other components within normal limits  GLUCOSE, CAPILLARY - Abnormal; Notable for the following:    Glucose-Capillary 51 (*)    All other components within normal limits  URINALYSIS, ROUTINE W REFLEX MICROSCOPIC  CARDIAC PANEL(CRET KIN+CKTOT+MB+TROPI)   No results found.   Date: 10/12/2011  Rate: 75  Rhythm: normal sinus rhythm  QRS Axis: normal  Intervals: normal  ST/T Wave abnormalities: normal  Conduction Disutrbances: none  Narrative Interpretation: unremarkable      1. Hypoglycemia       MDM   Patient with a history significant for hypertension, diabetes, prostate cancer with overactive bladder presents with complaints of not feeling well. On arrival here his blood sugar was 41 despite him having a [employer], cookies inhibitor factors just 2 hours prior. He states that he takes Lantus at night and glipizide and metformin during the day. Patient normal yesterday but when he woke up today with not feeling well and has had urinary urgency and frequency but no dysuria. He denies any fever, nausea, vomiting or abdominal pain. On exam he is awake and alert and in no acute distress with normal vital signs. Initially will try all methods to improve his blood sugar and will monitor closely. Concern for possible renal insufficiency  versus UTI versus cardiac cause for his new hypoglycemia.  7:16 PM After allergies patient's blood sugar only improved to 50. Will give D50 and follow closely   7:50 PM All labs are within normal limits including a normal creatinine and no sign of UTI. Discussed in medications with patient he states that it is very unlikely that he took more medication than he is supposed to. He states he puts them in a medication box however they state for the last 3 days they've been moving and it is possible that he took too much medication. After D50 will check blood sugar and observe the patient to ensure that it does not drop  10:26 PM After several hours patient's blood sugar has remained stable and he will be discharged home to  Blanchie Dessert, MD 10/12/11 [place], MD 10/12/11 2228

## 2011-10-12 NOTE — Discharge Instructions (Signed)
Low Blood Sugar Low blood sugar (hypoglycemia) means that the level of sugar in your blood is lower than it should be. Signs of low blood sugar include:  Getting sweaty.   Feeling hungry.   Feeling dizzy or weak.   Feeling sleepier than normal.   Feeling nervous.   Headaches.   Having a fast heartbeat.  Low blood sugar can happen fast and can be an emergency. Your doctor can do tests to check your blood sugar level. You can have low blood sugar and not have diabetes. HOME CARE  Check your blood sugar as told by your doctor. If it is less than 70 mg/dl or as told by your doctor, take 1 of the following:   3 to 4 glucose tablets.    cup clear juice.    cup soda pop, not diet.   1 cup milk.   5 to 6 hard candies.   Recheck blood sugar after 15 minutes. Repeat until it is at the right level.   Eat a snack if it is more than 1 hour until the next meal.   Only take medicine as told by your doctor.   Do not skip meals. Eat on time.   Do not drink alcohol except with meals.   Check your blood glucose before driving.   Check your blood glucose before and after exercise.   Always carry treatment with you, such as glucose pills.   Always wear a medical alert bracelet if you have diabetes.  GET HELP RIGHT AWAY IF:   Your blood glucose goes below 70 mg/dl or as told by your doctor, and you:   Are confused.   Are not able to swallow.   Pass out (faint).   You cannot treat yourself. You may need someone to help you.   You have low blood sugar problems often.   You have problems from your medicines.   You are not feeling better after 3 to 4 days.   You have vision changes.  MAKE SURE YOU:   Understand these instructions.   Will watch this condition.   Will get help right away if you are not doing well or get worse.  Document Released: 07/26/2009 Document Revised: 04/20/2011 Document Reviewed: 07/26/2009 [facility] Patient Information 2012 [place].

## 2011-10-13 ENCOUNTER — Encounter: Payer: Self-pay | Admitting: Endocrinology

## 2011-10-13 ENCOUNTER — Ambulatory Visit (INDEPENDENT_AMBULATORY_CARE_PROVIDER_SITE_OTHER): Payer: Medicare Other | Admitting: Endocrinology

## 2011-10-13 VITALS — BP 104/60 | HR 96 | Temp 98.2°F

## 2011-10-13 DIAGNOSIS — E119 Type 2 diabetes mellitus without complications: Secondary | ICD-10-CM

## 2011-10-13 MED ORDER — GLUCOSE BLOOD VI STRP: 1.0000 | ORAL_STRIP | Freq: Two times a day (BID) | Status: DC

## 2011-10-13 NOTE — Patient Instructions (Addendum)
Stop lantus for now. Please come back for a follow-up appointment with dr Linda Hedges in 3 days. check your blood sugar 2 times a day.  vary the time of day when you check, between before the 3 meals, and at bedtime.  also check if you have symptoms of your blood sugar being too high or too low.  please keep a record of the readings and bring it to your next appointment here.  please call us sooner if your blood sugar goes below 70, or if it stays over 200.  Continue the 2 diabetes pills.

## 2011-10-13 NOTE — Progress Notes (Signed)
Subjective:    Patient ID: Antonio Adams, male    DOB: 1945-08-22, 66 y.o.   MRN: [id number]  HPI Pt was seen in er last night for hypoglycemia, in the afternoon.  He takes lantus at bedtime.  He has run out of cbg test strips.  He does not know what brand of meter he has.  cbg was low today, despite the fact that last insulin dose was night before last.  Pt says he does not know why cbg's have gone low so suddenly.   Past Medical History  Diagnosis Date  . DIABETES MELLITUS, TYPE II 03/16/2007  . HYPERLIPIDEMIA 03/16/2007  . CARPAL TUNNEL SYNDROME, BILATERAL 03/16/2007  . HYPERTENSION 03/16/2007  . ALLERGIC RHINITIS 03/16/2007  . GERD 03/16/2007  . BACK PAIN WITH RADICULOPATHY 11/23/2008  . LEG CRAMPS 05/22/2007  . PARESTHESIA 05/21/2007  . GASTROINTESTINAL HEMORRHAGE, HX OF 03/16/2007  . ADENOCARCINOMA, PROSTATE, GLEASON GRADE 6 12/21/2009  . Sleep apnea   . Overactive bladder   . Hemorrhoids     Past Surgical History  Procedure Date  . Back surgery 1983, 2008    repeat surgery; ESI '08  . Shoulder surgery   . Carpal tunnel release     bilateral  . Cardiac catheterization 06/28/2010    non-obstructive disease  . Stress cardiolite 12/24/2003    negative for ischemia  . Wound exploration     management of a wound that he sustained in the [employer]  . Prostate surgery march 2012    seed implant  . Cervical fusion     History   Social History  . Marital Status: Married    Spouse Name: N/A    Number of Children: 2  . Years of Education: 12+   Occupational History  . Event organiser; Curator     retired from Event organiser   Social History Main Topics  . Smoking status: Never Smoker   . Smokeless tobacco: Never Used  . Alcohol Use: No  . Drug Use: No  . Sexually Active: Yes -- Male partner(s)   Other Topics Concern  . Not on file   Social History Narrative   Married-divorced '89, remarried '96. 2 sons, 1 daughter. 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 He wishes to be a full resuscitation candidate. Daily caffeine     Current Outpatient Prescriptions on File Prior to Visit  Medication Sig Dispense Refill  . aspirin 325 MG tablet Take 325 mg by mouth daily.        . Cholecalciferol 1000 UNITS capsule Take 1,000 Units by mouth daily.        Marland Kitchen diltiazem (DILACOR XR) 120 MG 24 hr capsule Take 120 mg by mouth daily.        . fesoterodine (TOVIAZ) 4 MG TB24 Take 4 mg by mouth daily.      Marland Kitchen gabapentin (NEURONTIN) 100 MG capsule Take 100 mg by mouth at bedtime.      Marland Kitchen gemfibrozil (LOPID) 600 MG tablet Take 600 mg by mouth 2 (two) times daily.       . Insulin Pen Needle (QC PEN NEEDLES) 31G X 8 MM MISC Use needle for 2-3 administrations  30 each  11  . lisinopril (PRINIVIL,ZESTRIL) 40 MG tablet Take 20 mg by mouth daily.       . metFORMIN (GLUCOPHAGE) 1000 MG tablet Take 1 tablet (1,000 mg total) by mouth 2 (two) times daily with a meal.  60 tablet  1  . Multiple Vitamin (MULTIVITAMIN) tablet Take 1 tablet by mouth daily.        . OMEGA 3 1200 MG CAPS Take 1,200 mg by mouth daily.      Marland Kitchen omeprazole (PRILOSEC) 20 MG capsule Take 20 mg by mouth daily.        . sitaGLIPtin (JANUVIA) 100 MG tablet Take 1 tablet (100 mg total) by mouth daily.  30 tablet  5    No Known Allergies  Family History  Problem Relation Age of Onset  . Cancer Sister 30    Cancer  . Hypertension Other   . Colon cancer Neg Hx   . Esophageal cancer Neg Hx   . Stomach cancer Neg Hx     BP 104/60  Pulse 96  Temp(Src) 98.2 F (36.8 C) (Oral)    Review of Systems Denies LOC    Objective:   Physical Exam VITAL SIGNS:  See vs page GENERAL: no distress Gait: normal and steady PSYCH: Alert and oriented.        Assessment & Plan:  DM, overcontrolled now, for uncertain reason

## 2011-10-16 ENCOUNTER — Encounter: Payer: Self-pay | Admitting: Internal Medicine

## 2011-10-16 ENCOUNTER — Ambulatory Visit (INDEPENDENT_AMBULATORY_CARE_PROVIDER_SITE_OTHER): Payer: Medicare Other | Admitting: Internal Medicine

## 2011-10-16 VITALS — BP 110/70 | HR 98 | Temp 98.3°F | Resp 16 | Wt 242.0 lb

## 2011-10-16 DIAGNOSIS — E119 Type 2 diabetes mellitus without complications: Secondary | ICD-10-CM

## 2011-10-17 NOTE — Progress Notes (Signed)
  Subjective:    Patient ID: Antonio Adams, male    DOB: 08/14/1945, 66 y.o.   MRN: [id number]  HPI Antonio Adams has been followed for diabetes and was on basal insulin therapy, januvia and metformin. He had an episode of hypoglycemia for which he was treated in the ED Oct 11, 2013, notes and labs reviewed. His CBG was 41 at presentation with serum glucose of 54. He was hydrated and given IV D50 and discharged home after several hours. He was seen by Dr. Loanne Drilling May 31 and still had low CBG. He was given multiple glucose tabs and OJ and did have a reasonable CBG at the time he left the office. He was told to d/c lantus and to keep a record of his blood sugars.   It is noteworthy that he has been working hard on a diabetic diet and has had real improvement. He is motivated by the need to have back surgery which has been postponed due to out of control DM.  PMH, FamHx and SocHx reviewed for any changes and relevance.    Review of Systems System review is negative for any constitutional, cardiac, pulmonary, GI or neuro symptoms or complaints other than as described in the HPI.     Objective:   Physical Exam Filed Vitals:   10/16/11 1625  BP: 110/70  Pulse: 98  Temp: 98.3 F (36.8 C)  Resp: 16   Wt Readings from Last 3 Encounters:  10/16/11 242 lb (109.77 kg)  10/12/11 235 lb (106.595 kg)  09/28/11 240 lb (108.863 kg)   Gen'l - overweight AA man in no distress  Home CBG's reviewed - good control with readings in the 90-120 range.       Assessment & Plan:

## 2011-10-17 NOTE — Assessment & Plan Note (Signed)
Recent episode of prolonged hypoglycemia. He has lost weight since his initial diagnosis and he is following a no sugar low carb diet. At this point he no longer requires basal insulin therapy.  Plan D/c Lantus  Continue Januvia and metformin.   He is medically stable for surgery.

## 2011-10-23 ENCOUNTER — Encounter: Payer: Self-pay | Admitting: Internal Medicine

## 2011-10-23 ENCOUNTER — Ambulatory Visit (AMBULATORY_SURGERY_CENTER): Payer: Medicare Other | Admitting: Internal Medicine

## 2011-10-23 VITALS — BP 133/83 | HR 75 | Temp 98.1°F | Resp 18 | Ht 72.0 in | Wt 240.0 lb

## 2011-10-23 DIAGNOSIS — K625 Hemorrhage of anus and rectum: Secondary | ICD-10-CM

## 2011-10-23 DIAGNOSIS — K627 Radiation proctitis: Secondary | ICD-10-CM

## 2011-10-23 DIAGNOSIS — K6289 Other specified diseases of anus and rectum: Secondary | ICD-10-CM

## 2011-10-23 DIAGNOSIS — Z86010 Personal history of colonic polyps: Secondary | ICD-10-CM

## 2011-10-23 LAB — GLUCOSE, CAPILLARY
Glucose-Capillary: 97 mg/dL (ref 70–99)
Glucose-Capillary: 83 mg/dL (ref 70–99)

## 2011-10-23 MED ORDER — SODIUM CHLORIDE 0.9 % IV SOLN: 500.0000 mL | INTRAVENOUS | Status: DC

## 2011-10-23 NOTE — Op Note (Signed)
[facility] [place], [place]  [id number]  COLONOSCOPY PROCEDURE REPORT  PATIENT:  Antonio, Adams  MR#:  [id number] BIRTHDATE:  [birth year], 4 yrs. old  GENDER:  male ENDOSCOPIST:  Docia Chuck. Geri Seminole, MD REF. BY:  Office PROCEDURE DATE:  10/23/2011 PROCEDURE:  Diagnostic Colonoscopy ASA CLASS:  Class II INDICATIONS:  rectal bleeding- new onset, history  of pre-cancerous (adenomatous) colon polyps ; index exam 2002 (TA); f/u 2007, 2012. hx radiation therapy for prostate cancer MEDICATIONS:   MAC sedation, administered by CRNA, propofol (Diprivan) 150 mg IV  DESCRIPTION OF PROCEDURE:   After the risks benefits and alternatives of the procedure were thoroughly explained, informed consent was obtained.  Digital rectal exam was performed and revealed no abnormalities.   The LB CF-[id number] endoscope was introduced through the anus and advanced to the cecum, which was identified by both the appendix and ileocecal valve, without limitations.  The quality of the prep was excellent, using MoviPrep.  The instrument was then slowly withdrawn as the colon was fully examined. <<PROCEDUREIMAGES>>  FINDINGS:  There were mucosal changes consistent with radiation proctitis in the rectum.  Otherwise normal colonoscopy without polyps, masses, vascular ectasias, or inflammatory changes. Retroflexed views in the rectum revealed radiation proctitis.    The time to cecum =   2:40  minutes. The scope was then withdrawn in 7:23  minutes from the cecum and the procedure completed.  COMPLICATIONS:  None  ENDOSCOPIC IMPRESSION: 1) Radiation proctitis with associated bleeding (clinically mild) 2) Otherwise normal colonoscopy  RECOMMENDATIONS: 1) Follow up colonoscopy in 5 years (personal hx adenomas) 2) MVI with iron daily  ______________________________ Docia Chuck. Geri Seminole, MD  CC:  Neena Rhymes, MD; Kathie Rhodes, MD;  Tyler Pita, MD; The  Patient  n. eSIGNED:   Docia Chuck. Geri Seminole at 10/23/2011 03:33 PM  Gabriel Rung, [id number]

## 2011-10-23 NOTE — Progress Notes (Signed)
Patient did not experience any of the following events: a burn prior to discharge; a fall within the facility; wrong site/side/patient/procedure/implant event; or a hospital transfer or hospital admission upon discharge from the facility. (G8907) Patient did not have preoperative order for IV antibiotic SSI prophylaxis. (G8918)  

## 2011-10-23 NOTE — Patient Instructions (Signed)
YOU HAD AN ENDOSCOPIC PROCEDURE TODAY AT THE [facility]: Refer to the procedure report that was given to you for any specific questions about what was found during the examination.  If the procedure report does not answer your questions, please call your gastroenterologist to clarify.  If you requested that your care partner not be given the details of your procedure findings, then the procedure report has been included in a sealed envelope for you to review at your convenience later.  YOU SHOULD EXPECT: Some feelings of bloating in the abdomen. Passage of more gas than usual.  Walking can help get rid of the air that was put into your GI tract during the procedure and reduce the bloating. If you had a lower endoscopy (such as a colonoscopy or flexible sigmoidoscopy) you may notice spotting of blood in your stool or on the toilet paper. If you underwent a bowel prep for your procedure, then you may not have a normal bowel movement for a few days.  DIET: Your first meal following the procedure should be a light meal and then it is ok to progress to your normal diet.  A half-sandwich or bowl of soup is an example of a good first meal.  Heavy or fried foods are harder to digest and may make you feel nauseous or bloated.  Likewise meals heavy in dairy and vegetables can cause extra gas to form and this can also increase the bloating.  Drink plenty of fluids but you should avoid alcoholic beverages for 24 hours.  ACTIVITY: Your care partner should take you home directly after the procedure.  You should plan to take it easy, moving slowly for the rest of the day.  You can resume normal activity the day after the procedure however you should NOT DRIVE or use heavy machinery for 24 hours (because of the sedation medicines used during the test).    SYMPTOMS TO REPORT IMMEDIATELY: A gastroenterologist can be reached at any hour.  During normal business hours, 8:30 AM to 5:00 PM Monday through Friday,  call [phone].  After hours and on weekends, please call the GI answering service at [phone] who will take a message and have the physician on call contact you.   Following lower endoscopy (colonoscopy or flexible sigmoidoscopy):  Excessive amounts of blood in the stool  Significant tenderness or worsening of abdominal pains  Swelling of the abdomen that is new, acute  Fever of 100F or higher    FOLLOW UP: If any biopsies were taken you will be contacted by phone or by letter within the next 1-3 weeks.  Call your gastroenterologist if you have not heard about the biopsies in 3 weeks.  Our staff will call the home number listed on your records the next business day following your procedure to check on you and address any questions or concerns that you may have at that time regarding the information given to you following your procedure. This is a courtesy call and so if there is no answer at the home number and we have not heard from you through the emergency physician on call, we will assume that you have returned to your regular daily activities without incident.  SIGNATURES/CONFIDENTIALITY: You and/or your care partner have signed paperwork which will be entered into your electronic medical record.  These signatures attest to the fact that that the information above on your After Visit Summary has been reviewed and is understood.  Full responsibility of the confidentiality   of this discharge information lies with you and/or your care-partner.    Information on proctitis given to you today.   Dr. Henrene Pastor suggests a multi vitamin with iron daily.   Repeat colonoscopy in 5 yrs.

## 2011-10-24 ENCOUNTER — Telehealth: Payer: Self-pay | Admitting: *Deleted

## 2011-10-24 NOTE — Telephone Encounter (Signed)
  Follow up Call-  Call back number 10/23/2011 12/15/2010  Post procedure Call Back phone  # [phone]  Permission to leave phone message Yes -     Patient questions:   Left message to call if necessary.

## 2011-10-25 ENCOUNTER — Encounter (HOSPITAL_COMMUNITY): Payer: Self-pay | Admitting: Respiratory Therapy

## 2011-10-26 ENCOUNTER — Ambulatory Visit (HOSPITAL_COMMUNITY)
Admission: RE | Admit: 2011-10-26 | Discharge: 2011-10-26 | Disposition: A | Discharge status: Home / Self Care | Payer: Medicare Other | Source: Ambulatory Visit | Attending: Physician Assistant | Admitting: Physician Assistant

## 2011-10-26 ENCOUNTER — Encounter (HOSPITAL_COMMUNITY): Payer: Self-pay

## 2011-10-26 ENCOUNTER — Encounter (HOSPITAL_COMMUNITY)
Admission: RE | Admit: 2011-10-26 | Discharge: 2011-10-26 | Disposition: A | Discharge status: Home / Self Care | Payer: Medicare Other | Source: Ambulatory Visit | Attending: Specialist | Admitting: Specialist

## 2011-10-26 DIAGNOSIS — Z01812 Encounter for preprocedural laboratory examination: Secondary | ICD-10-CM | POA: Insufficient documentation

## 2011-10-26 DIAGNOSIS — Z01818 Encounter for other preprocedural examination: Secondary | ICD-10-CM | POA: Insufficient documentation

## 2011-10-26 LAB — URINALYSIS, ROUTINE W REFLEX MICROSCOPIC
Bilirubin Urine: NEGATIVE
Hgb urine dipstick: NEGATIVE
Ketones, ur: NEGATIVE mg/dL
Protein, ur: NEGATIVE mg/dL
Urobilinogen, UA: 0.2 mg/dL (ref 0.0–1.0)

## 2011-10-26 LAB — DIFFERENTIAL
Basophils Relative: 1 % (ref 0–1)
Eosinophils Absolute: 0.1 10*3/uL (ref 0.0–0.7)
Monocytes Absolute: 0.4 10*3/uL (ref 0.1–1.0)
Monocytes Relative: 11 % (ref 3–12)
Neutrophils Relative %: 65 % (ref 43–77)

## 2011-10-26 LAB — CBC
HCT: 31.1 % — ABNORMAL LOW (ref 39.0–52.0)
Hemoglobin: 10.5 g/dL — ABNORMAL LOW (ref 13.0–17.0)
RBC: 3.74 MIL/uL — ABNORMAL LOW (ref 4.22–5.81)
RDW: 14.7 % (ref 11.5–15.5)
WBC: 3.8 10*3/uL — ABNORMAL LOW (ref 4.0–10.5)

## 2011-10-26 LAB — COMPREHENSIVE METABOLIC PANEL
Albumin: 4 g/dL (ref 3.5–5.2)
Alkaline Phosphatase: 84 U/L (ref 39–117)
BUN: 26 mg/dL — ABNORMAL HIGH (ref 6–23)
Chloride: 100 mEq/L (ref 96–112)
Potassium: 4.1 mEq/L (ref 3.5–5.1)
Total Bilirubin: 0.1 mg/dL — ABNORMAL LOW (ref 0.3–1.2)

## 2011-10-26 MED ORDER — CHLORHEXIDINE GLUCONATE 4 % EX LIQD: 60.0000 mL | Freq: Once | CUTANEOUS | Status: DC

## 2011-10-26 NOTE — Pre-Procedure Instructions (Addendum)
20 Antonio Adams  10/26/2011   Your procedure is scheduled on:  10/30/11  Report to [facility] at 530 AM.  Call this number if you have problems the morning of surgery: [phone]   Remember:   Do not eat food OR DRINK:After Midnight.    Take these medicines the morning of surgery with A SIP OF WATER: *diltiazem, gabapentin, omeprazole  STOP omega 3, multi vit  Aspirin now**   .  Do not wear lotions, powders,  You may wear deodorant.  . Men may shave face and neck.  Do not bring valuables to the hospital.  Contacts, dentures or bridgework may not be worn into surgery.  Leave suitcase in the car. After surgery it may be brought to your room.  For patients admitted to the hospital, checkout time is 11:00 AM the day of discharge.   Patients discharged the day of surgery will not be allowed to drive home.  Name and phone number of your driver:ANNIE [phone] CELL [phone] HM  Special Instructions: Incentive Spirometry - Practice and bring it with you on the day of surgery. and CHG Shower Use Special Wash: 1/2 bottle night before surgery and 1/2 bottle morning of surgery.   Please read over the following fact sheets that you were given: Pain Booklet, Coughing and Deep Breathing, MRSA Information and Surgical Site Infection Prevention

## 2011-10-26 NOTE — Progress Notes (Signed)
SLEEP STUDY REQ'D FROM [place]

## 2011-10-27 ENCOUNTER — Telehealth: Payer: Self-pay | Admitting: *Deleted

## 2011-10-27 NOTE — Telephone Encounter (Signed)
Allison-PA with [facility] called regarding pt. She states that pt is scheduled for surgery on Monday and that his creatinine was 1.73. She states that pt is currently taking Metformin and Lisinopril. She is asking for MD's advisement regarding DM meds that can possibly be affecting pt's creatinine level. Pt is MEN's pt but was seen a few weeks ago for hypoglycemic episode and she is asking for your advisement since MEN is out today.

## 2011-10-27 NOTE — H&P (Signed)
Reviewed. jen

## 2011-10-27 NOTE — Telephone Encounter (Signed)
Pt informed of MD's advisement to hold Metformin until after surgery.

## 2011-10-27 NOTE — H&P (Signed)
Antonio Adams is an 66 y.o. male.   Chief Complaint: right leg pain with numbness and tingling HPI: pt with chronic and progressive back and right leg pain related to herniated nucleus pulposis right L4-5 and L5-S1.  Initially scheduled for surgical intervention early this year but unable to proceed secondary to medical issues that needed evaluation and treatment.  He was treated conservatively with ESIs and activity modification. Latest MRI scan in February 2013 showed interval enlargement of broad-based chronic right paracentral disc protrusion at L4-L5. There is increased mass effect on the right L5 nerve root in the lateral recess.  Chronic spondylosis and postsurgical changes at L5-S1 appear unchanged. There is chronic right lateral recess and biforaminal stenosis. The additional disc space levels appear stable without significant spinal stenosis or nerve root encroachment. He now presents for surgical intervention for microdiscectomy at right L4-5 and L5-S1 after clearance from primary care physician.   Past Medical History  Diagnosis Date  . DIABETES MELLITUS, TYPE II 03/16/2007  . HYPERLIPIDEMIA 03/16/2007  . CARPAL TUNNEL SYNDROME, BILATERAL 03/16/2007  . HYPERTENSION 03/16/2007  . ALLERGIC RHINITIS 03/16/2007  . GERD 03/16/2007  . BACK PAIN WITH RADICULOPATHY 11/23/2008  . LEG CRAMPS 05/22/2007  . PARESTHESIA 05/21/2007  . GASTROINTESTINAL HEMORRHAGE, HX OF 03/16/2007  . Overactive bladder   . Hemorrhoids   . Sleep apnea     CPAP USED FOR 2 YRS   . ADENOCARCINOMA, PROSTATE, GLEASON GRADE 6 12/21/2009    Past Surgical History  Procedure Date  . Back surgery 1983, 2008    repeat surgery; ESI '08  . Shoulder surgery LFT  . Carpal tunnel release     bilateral  . Stress cardiolite 12/24/2003    negative for ischemia  . Wound exploration     management of a wound that he sustained in the [employer]  . Prostate surgery march 2012    seed implant  . Cervical fusion   . Cardiac  catheterization 06/28/2010    non-obstructive disease-?NOT SURE THIS WAS DONE    Family History  Problem Relation Age of Onset  . Cancer Sister 30    Cancer  . Hypertension Other   . Colon cancer Neg Hx   . Esophageal cancer Neg Hx   . Stomach cancer Neg Hx    Social History:  reports that he has never smoked. He has never used smokeless tobacco. He reports that he does not drink alcohol or use illicit drugs.  Allergies: No Known Allergies  No prescriptions prior to admission    Results for orders placed during the hospital encounter of 10/26/11 (from the past 48 hour(s))  URINALYSIS, ROUTINE W REFLEX MICROSCOPIC     Status: Normal   Collection Time   10/26/11  3:15 PM      Component Value Range Comment   Color, Urine YELLOW  YELLOW    APPearance CLEAR  CLEAR    Specific Gravity, Urine 1.021  1.005 - 1.030    pH 5.5  5.0 - 8.0    Glucose, UA NEGATIVE  NEGATIVE mg/dL    Hgb urine dipstick NEGATIVE  NEGATIVE    Bilirubin Urine NEGATIVE  NEGATIVE    Ketones, ur NEGATIVE  NEGATIVE mg/dL    Protein, ur NEGATIVE  NEGATIVE mg/dL    Urobilinogen, UA 0.2  0.0 - 1.0 mg/dL    Nitrite NEGATIVE  NEGATIVE    Leukocytes, UA NEGATIVE  NEGATIVE MICROSCOPIC NOT DONE ON URINES WITH NEGATIVE PROTEIN, BLOOD, LEUKOCYTES, NITRITE, OR GLUCOSE <1000  mg/dL.  SURGICAL PCR SCREEN     Status: Abnormal   Collection Time   10/26/11  3:17 PM      Component Value Range Comment   MRSA, PCR NEGATIVE  NEGATIVE    Staphylococcus aureus POSITIVE (*) NEGATIVE   CBC     Status: Abnormal   Collection Time   10/26/11  3:32 PM      Component Value Range Comment   WBC 3.8 (*) 4.0 - 10.5 K/uL    RBC 3.74 (*) 4.22 - 5.81 MIL/uL    Hemoglobin 10.5 (*) 13.0 - 17.0 g/dL    HCT 31.1 (*) 39.0 - 52.0 %    MCV 83.2  78.0 - 100.0 fL    MCH 28.1  26.0 - 34.0 pg    MCHC 33.8  30.0 - 36.0 g/dL    RDW 14.7  11.5 - 15.5 %    Platelets 240  150 - 400 K/uL   COMPREHENSIVE METABOLIC PANEL     Status: Abnormal   Collection  Time   10/26/11  3:32 PM      Component Value Range Comment   Sodium 136  135 - 145 mEq/L    Potassium 4.1  3.5 - 5.1 mEq/L    Chloride 100  96 - 112 mEq/L    CO2 23  19 - 32 mEq/L    Glucose, Bld 143 (*) 70 - 99 mg/dL    BUN 26 (*) 6 - 23 mg/dL    Creatinine, Ser 1.73 (*) 0.50 - 1.35 mg/dL    Calcium 10.5  8.4 - 10.5 mg/dL    Total Protein 7.3  6.0 - 8.3 g/dL    Albumin 4.0  3.5 - 5.2 g/dL    AST 13  0 - 37 U/L    ALT 13  0 - 53 U/L    Alkaline Phosphatase 84  39 - 117 U/L    Total Bilirubin 0.1 (*) 0.3 - 1.2 mg/dL    GFR calc non Af Amer 40 (*) >90 mL/min    GFR calc Af Amer 46 (*) >90 mL/min   DIFFERENTIAL     Status: Normal   Collection Time   10/26/11  3:32 PM      Component Value Range Comment   Neutrophils Relative 65  43 - 77 %    Neutro Abs 2.4  1.7 - 7.7 K/uL    Lymphocytes Relative 21  12 - 46 %    Lymphs Abs 0.8  0.7 - 4.0 K/uL    Monocytes Relative 11  3 - 12 %    Monocytes Absolute 0.4  0.1 - 1.0 K/uL    Eosinophils Relative 2  0 - 5 %    Eosinophils Absolute 0.1  0.0 - 0.7 K/uL    Basophils Relative 1  0 - 1 %    Basophils Absolute 0.0  0.0 - 0.1 K/uL    Dg Chest 2 View  10/26/2011  *RADIOLOGY REPORT*  Clinical Data: Preop for lumbar surgery  CHEST - 2 VIEW  Comparison: 07/21/2010  Findings: Cardiomediastinal silhouette is stable.  No acute infiltrate or pleural effusion.  No pulmonary edema.  Stable degenerative changes thoracic spine.  IMPRESSION:  No active disease.  No significant change  Original Report Authenticated By: Lahoma Crocker, M.D.    Review of Systems  Constitutional: Negative.   HENT: Negative.   Eyes: Negative.   Respiratory: Negative.   Cardiovascular: Negative.   Gastrointestinal: Negative.   Genitourinary: Negative.  Musculoskeletal: Positive for back pain.  Skin: Negative.   Neurological: Positive for tingling.       Right leg with numbness and tingling   Endo/Heme/Allergies: Negative.   Psychiatric/Behavioral: Negative.     There  were no vitals taken for this visit. Physical Exam  Constitutional: He is oriented to person, place, and time. He appears well-developed and well-nourished.  HENT:  Head: Normocephalic and atraumatic.  Eyes: EOM are normal. Pupils are equal, round, and reactive to light.  Neck: Normal range of motion. Neck supple.  Cardiovascular: Normal rate, regular rhythm, normal heart sounds and intact distal pulses.   Respiratory: Effort normal and breath sounds normal.  GI: Soft. Bowel sounds are normal.  Musculoskeletal: He exhibits no edema.       + SLR right .  Ankle DF 5-/5 on right .   Painful ROM of lumbar spine  Neurological: He is alert and oriented to person, place, and time.  Skin: Skin is warm and dry.  Psychiatric: He has a normal mood and affect.     Assessment/Plan lateral recess stenosis and HNP right L4-5 and L5 -S1  PLAN:  Microdiscectomy right L4-5 and right L5-S1  Shemeka Wardle M 10/27/2011, 11:06 AM

## 2011-10-27 NOTE — Telephone Encounter (Signed)
Ok to hold metformin until after surgery

## 2011-10-27 NOTE — Consult Note (Signed)
Anesthesia Chart Review:  Patient is a 66 year old male scheduled for right L4-5, L5-S1 microdiscectomy on 10/30/11. He was initially scheduled for this procedure in January but was canceled until his DM could be better controlled.  History includes DM2 (Dr. Loanne Drilling), HLD, HTN, GERD, prostate CA s/p seed implant, OSA, cervical fusion, GI bleed '08, overactive bladder, paresthesia, non-smoker.  PCP is Dr. Linda Hedges who medically cleared patient for this procedure.  He is also followed at the [facility] in [place].    EKG on 10/12/11 showed NSR.  According to Dr. Daylene Posey 06/16/10 OV note, he had remote cardiac testing including a 2002 cath showing non-obstructive disease and a negative stress test in 12/24/03.   There was no active disease on CXR from 10/26/11.   Labs show a BUN/Cr 26/1.73.  His baseline Cr has been around 1.1 - 1.3.  (Medicationsn include metformin and lisinopril.)  Glucose 143, H/H 10.5/31.1.  (Hgb has been ~ 11 since 2012.)  Dr. Linda Hedges is not in the office today, but I did route the lab results to his inbox.  I did call the results to Dr. Cordelia Pen nurse Caryl Pina so Dr. Loanne Drilling could decide if he would like to make any changes in patient's DM regimen.  I will order a BMET preoperatively to re-evaluate his renal function.  If his BMET is stable, anticipate he can proceed as planned.  Anesthesiologist agrees with this plan.  I updated Sherri at Dr. Otho Ket office.   Myra Gianotti, PA-C

## 2011-10-29 MED ORDER — CEFAZOLIN SODIUM-DEXTROSE 2-3 GM-% IV SOLR
2.0000 g | INTRAVENOUS | Status: AC
  Administered 2011-10-30: 2 g via INTRAVENOUS
  Filled 2011-10-29: qty 50

## 2011-10-30 ENCOUNTER — Encounter (HOSPITAL_COMMUNITY)
Admission: RE | Disposition: A | Discharge status: Home / Self Care | Payer: Self-pay | Source: Ambulatory Visit | Attending: Specialist

## 2011-10-30 ENCOUNTER — Encounter (HOSPITAL_COMMUNITY): Payer: Self-pay | Admitting: Vascular Surgery

## 2011-10-30 ENCOUNTER — Inpatient Hospital Stay (HOSPITAL_COMMUNITY): Payer: Medicare Other

## 2011-10-30 ENCOUNTER — Encounter (HOSPITAL_COMMUNITY): Payer: Self-pay | Admitting: Specialist

## 2011-10-30 ENCOUNTER — Inpatient Hospital Stay (HOSPITAL_COMMUNITY): Payer: Medicare Other | Admitting: Vascular Surgery

## 2011-10-30 ENCOUNTER — Inpatient Hospital Stay (HOSPITAL_COMMUNITY)
Admission: RE | Admit: 2011-10-30 | Discharge: 2011-10-31 | DRG: 491 | Disposition: A | Discharge status: Home / Self Care | Payer: Medicare Other | Source: Ambulatory Visit | Attending: Specialist | Admitting: Specialist

## 2011-10-30 DIAGNOSIS — E119 Type 2 diabetes mellitus without complications: Secondary | ICD-10-CM | POA: Diagnosis present

## 2011-10-30 DIAGNOSIS — E785 Hyperlipidemia, unspecified: Secondary | ICD-10-CM | POA: Diagnosis present

## 2011-10-30 DIAGNOSIS — K219 Gastro-esophageal reflux disease without esophagitis: Secondary | ICD-10-CM | POA: Diagnosis present

## 2011-10-30 DIAGNOSIS — M5416 Radiculopathy, lumbar region: Secondary | ICD-10-CM

## 2011-10-30 DIAGNOSIS — M5126 Other intervertebral disc displacement, lumbar region: Principal | ICD-10-CM | POA: Diagnosis present

## 2011-10-30 DIAGNOSIS — M48061 Spinal stenosis, lumbar region without neurogenic claudication: Secondary | ICD-10-CM | POA: Diagnosis present

## 2011-10-30 DIAGNOSIS — IMO0002 Reserved for concepts with insufficient information to code with codable children: Secondary | ICD-10-CM | POA: Diagnosis present

## 2011-10-30 DIAGNOSIS — I10 Essential (primary) hypertension: Secondary | ICD-10-CM | POA: Diagnosis present

## 2011-10-30 HISTORY — PX: LUMBAR LAMINECTOMY: SHX95

## 2011-10-30 LAB — BASIC METABOLIC PANEL
BUN: 26 mg/dL — ABNORMAL HIGH (ref 6–23)
CO2: 22 mEq/L (ref 19–32)
Chloride: 103 mEq/L (ref 96–112)
Glucose, Bld: 97 mg/dL (ref 70–99)
Potassium: 4.3 mEq/L (ref 3.5–5.1)

## 2011-10-30 LAB — GLUCOSE, CAPILLARY: Glucose-Capillary: 86 mg/dL (ref 70–99)

## 2011-10-30 SURGERY — MICRODISCECTOMY LUMBAR LAMINECTOMY
Anesthesia: General | Site: Spine Lumbar | Laterality: Right | Wound class: Clean

## 2011-10-30 MED ORDER — METHOCARBAMOL 100 MG/ML IJ SOLN
500.0000 mg | Freq: Four times a day (QID) | INTRAVENOUS | Status: DC | PRN
  Filled 2011-10-30: qty 5

## 2011-10-30 MED ORDER — BUPIVACAINE-EPINEPHRINE 0.5% -1:200000 IJ SOLN
INTRAMUSCULAR | Status: DC | PRN
  Administered 2011-10-30: 10 mL

## 2011-10-30 MED ORDER — HYDROCODONE-ACETAMINOPHEN 5-325 MG PO TABS: 1.0000 | ORAL_TABLET | ORAL | Status: DC | PRN

## 2011-10-30 MED ORDER — HYDROMORPHONE HCL PF 1 MG/ML IJ SOLN
INTRAMUSCULAR | Status: AC
  Filled 2011-10-30: qty 1

## 2011-10-30 MED ORDER — GEMFIBROZIL 600 MG PO TABS
600.0000 mg | ORAL_TABLET | Freq: Two times a day (BID) | ORAL | Status: DC
  Administered 2011-10-30 – 2011-10-31 (×3): 600 mg via ORAL
  Filled 2011-10-30 (×4): qty 1

## 2011-10-30 MED ORDER — SENNOSIDES-DOCUSATE SODIUM 8.6-50 MG PO TABS: 1.0000 | ORAL_TABLET | Freq: Every evening | ORAL | Status: DC | PRN

## 2011-10-30 MED ORDER — HYDROMORPHONE HCL PF 1 MG/ML IJ SOLN
0.2500 mg | INTRAMUSCULAR | Status: DC | PRN
  Administered 2011-10-30 (×2): 0.5 mg via INTRAVENOUS

## 2011-10-30 MED ORDER — ROCURONIUM BROMIDE 100 MG/10ML IV SOLN
INTRAVENOUS | Status: DC | PRN
  Administered 2011-10-30: 30 mg via INTRAVENOUS
  Administered 2011-10-30: 10 mg via INTRAVENOUS
  Administered 2011-10-30: 50 mg via INTRAVENOUS

## 2011-10-30 MED ORDER — DILTIAZEM HCL ER 120 MG PO CP24
120.0000 mg | ORAL_CAPSULE | Freq: Every day | ORAL | Status: DC
  Administered 2011-10-30 – 2011-10-31 (×2): 120 mg via ORAL
  Filled 2011-10-30 (×2): qty 1

## 2011-10-30 MED ORDER — FESOTERODINE FUMARATE ER 4 MG PO TB24
4.0000 mg | ORAL_TABLET | Freq: Every day | ORAL | Status: DC
  Administered 2011-10-30 – 2011-10-31 (×2): 4 mg via ORAL
  Filled 2011-10-30 (×2): qty 1

## 2011-10-30 MED ORDER — METFORMIN HCL 500 MG PO TABS
1000.0000 mg | ORAL_TABLET | Freq: Two times a day (BID) | ORAL | Status: DC
  Filled 2011-10-30 (×3): qty 2

## 2011-10-30 MED ORDER — ACETAMINOPHEN 650 MG RE SUPP: 650.0000 mg | RECTAL | Status: DC | PRN

## 2011-10-30 MED ORDER — METFORMIN HCL 500 MG PO TABS: 1000.0000 mg | ORAL_TABLET | Freq: Two times a day (BID) | ORAL | Status: DC

## 2011-10-30 MED ORDER — ONE-DAILY MULTI VITAMINS PO TABS
1.0000 | ORAL_TABLET | Freq: Every day | ORAL | Status: DC
Start: 2011-10-30 — End: 2011-10-30

## 2011-10-30 MED ORDER — PHENYLEPHRINE HCL 10 MG/ML IJ SOLN
INTRAMUSCULAR | Status: DC | PRN
  Administered 2011-10-30 (×2): 100 ug via INTRAVENOUS
  Administered 2011-10-30: 50 ug via INTRAVENOUS
  Administered 2011-10-30 (×2): 100 ug via INTRAVENOUS

## 2011-10-30 MED ORDER — LISINOPRIL 20 MG PO TABS
20.0000 mg | ORAL_TABLET | Freq: Every day | ORAL | Status: DC
  Administered 2011-10-30 – 2011-10-31 (×2): 20 mg via ORAL
  Filled 2011-10-30 (×2): qty 1

## 2011-10-30 MED ORDER — ZOLPIDEM TARTRATE 5 MG PO TABS: 5.0000 mg | ORAL_TABLET | Freq: Every evening | ORAL | Status: DC | PRN

## 2011-10-30 MED ORDER — FENTANYL CITRATE 0.05 MG/ML IJ SOLN
INTRAMUSCULAR | Status: DC | PRN
  Administered 2011-10-30: 50 ug via INTRAVENOUS
  Administered 2011-10-30: 100 ug via INTRAVENOUS
  Administered 2011-10-30: 50 ug via INTRAVENOUS

## 2011-10-30 MED ORDER — GABAPENTIN 100 MG PO CAPS
100.0000 mg | ORAL_CAPSULE | Freq: Every day | ORAL | Status: DC
  Administered 2011-10-30: 100 mg via ORAL
  Filled 2011-10-30 (×2): qty 1

## 2011-10-30 MED ORDER — MENTHOL 3 MG MT LOZG: 1.0000 | LOZENGE | OROMUCOSAL | Status: DC | PRN

## 2011-10-30 MED ORDER — ADULT MULTIVITAMIN W/MINERALS CH
1.0000 | ORAL_TABLET | Freq: Every day | ORAL | Status: DC
  Administered 2011-10-30 – 2011-10-31 (×2): 1 via ORAL
  Filled 2011-10-30 (×2): qty 1

## 2011-10-30 MED ORDER — CEFAZOLIN SODIUM 1-5 GM-% IV SOLN
1.0000 g | Freq: Three times a day (TID) | INTRAVENOUS | Status: AC
  Administered 2011-10-30 – 2011-10-31 (×2): 1 g via INTRAVENOUS
  Filled 2011-10-30 (×2): qty 50

## 2011-10-30 MED ORDER — THROMBIN 5000 UNITS EX SOLR
OROMUCOSAL | Status: DC | PRN
  Administered 2011-10-30: 09:00:00 via TOPICAL

## 2011-10-30 MED ORDER — DOCUSATE SODIUM 100 MG PO CAPS
100.0000 mg | ORAL_CAPSULE | Freq: Two times a day (BID) | ORAL | Status: DC
  Administered 2011-10-30 – 2011-10-31 (×3): 100 mg via ORAL
  Filled 2011-10-30 (×4): qty 1

## 2011-10-30 MED ORDER — MORPHINE SULFATE 2 MG/ML IJ SOLN: 1.0000 mg | INTRAMUSCULAR | Status: DC | PRN

## 2011-10-30 MED ORDER — ASPIRIN 325 MG PO TABS
325.0000 mg | ORAL_TABLET | Freq: Every day | ORAL | Status: DC
  Administered 2011-10-30 – 2011-10-31 (×2): 325 mg via ORAL
  Filled 2011-10-30 (×2): qty 1

## 2011-10-30 MED ORDER — SODIUM CHLORIDE 0.9 % IV SOLN: 250.0000 mL | INTRAVENOUS | Status: DC

## 2011-10-30 MED ORDER — BISACODYL 10 MG RE SUPP: 10.0000 mg | Freq: Every day | RECTAL | Status: DC | PRN

## 2011-10-30 MED ORDER — PHENOL 1.4 % MT LIQD: 1.0000 | OROMUCOSAL | Status: DC | PRN

## 2011-10-30 MED ORDER — LINAGLIPTIN 5 MG PO TABS
5.0000 mg | ORAL_TABLET | Freq: Every day | ORAL | Status: DC
  Administered 2011-10-31: 5 mg via ORAL
  Filled 2011-10-30: qty 1

## 2011-10-30 MED ORDER — PANTOPRAZOLE SODIUM 40 MG PO TBEC
40.0000 mg | DELAYED_RELEASE_TABLET | Freq: Every day | ORAL | Status: DC
  Administered 2011-10-30: 40 mg via ORAL
  Filled 2011-10-30: qty 1

## 2011-10-30 MED ORDER — METHOCARBAMOL 500 MG PO TABS: 500.0000 mg | ORAL_TABLET | Freq: Four times a day (QID) | ORAL | Status: DC | PRN

## 2011-10-30 MED ORDER — POTASSIUM CHLORIDE IN NACL 20-0.45 MEQ/L-% IV SOLN
INTRAVENOUS | Status: DC
  Administered 2011-10-30: 23:00:00 via INTRAVENOUS
  Filled 2011-10-30 (×4): qty 1000

## 2011-10-30 MED ORDER — SODIUM CHLORIDE 0.9 % IJ SOLN: 3.0000 mL | Freq: Two times a day (BID) | INTRAMUSCULAR | Status: DC

## 2011-10-30 MED ORDER — 0.9 % SODIUM CHLORIDE (POUR BTL) OPTIME
TOPICAL | Status: DC | PRN
  Administered 2011-10-30: 1000 mL

## 2011-10-30 MED ORDER — LACTATED RINGERS IV SOLN
INTRAVENOUS | Status: DC | PRN
  Administered 2011-10-30 (×2): via INTRAVENOUS

## 2011-10-30 MED ORDER — SODIUM CHLORIDE 0.9 % IV SOLN
10.0000 mg | INTRAVENOUS | Status: DC | PRN
  Administered 2011-10-30: 5 ug/min via INTRAVENOUS

## 2011-10-30 MED ORDER — ACETAMINOPHEN 325 MG PO TABS: 650.0000 mg | ORAL_TABLET | ORAL | Status: DC | PRN

## 2011-10-30 MED ORDER — LIDOCAINE HCL (CARDIAC) 20 MG/ML IV SOLN
INTRAVENOUS | Status: DC | PRN
  Administered 2011-10-30: 80 mg via INTRAVENOUS

## 2011-10-30 MED ORDER — INSULIN ASPART 100 UNIT/ML ~~LOC~~ SOLN: 0.0000 [IU] | Freq: Three times a day (TID) | SUBCUTANEOUS | Status: DC

## 2011-10-30 MED ORDER — NEOSTIGMINE METHYLSULFATE 1 MG/ML IJ SOLN
INTRAMUSCULAR | Status: DC | PRN
  Administered 2011-10-30: 4 mg via INTRAVENOUS

## 2011-10-30 MED ORDER — PROPOFOL 10 MG/ML IV EMUL
INTRAVENOUS | Status: DC | PRN
  Administered 2011-10-30: 200 mg via INTRAVENOUS

## 2011-10-30 MED ORDER — SODIUM CHLORIDE 0.9 % IJ SOLN: 3.0000 mL | INTRAMUSCULAR | Status: DC | PRN

## 2011-10-30 MED ORDER — ALUM & MAG HYDROXIDE-SIMETH 200-200-20 MG/5ML PO SUSP: 30.0000 mL | Freq: Four times a day (QID) | ORAL | Status: DC | PRN

## 2011-10-30 MED ORDER — ONDANSETRON HCL 4 MG/2ML IJ SOLN
INTRAMUSCULAR | Status: DC | PRN
  Administered 2011-10-30: 4 mg via INTRAVENOUS

## 2011-10-30 MED ORDER — KETOROLAC TROMETHAMINE 30 MG/ML IJ SOLN
30.0000 mg | Freq: Once | INTRAMUSCULAR | Status: AC
  Administered 2011-10-30: 30 mg via INTRAVENOUS
  Filled 2011-10-30: qty 1

## 2011-10-30 MED ORDER — ONDANSETRON HCL 4 MG/2ML IJ SOLN: 4.0000 mg | INTRAMUSCULAR | Status: DC | PRN

## 2011-10-30 MED ORDER — OXYCODONE-ACETAMINOPHEN 5-325 MG PO TABS: 1.0000 | ORAL_TABLET | ORAL | Status: DC | PRN

## 2011-10-30 MED ORDER — GLYCOPYRROLATE 0.2 MG/ML IJ SOLN
INTRAMUSCULAR | Status: DC | PRN
  Administered 2011-10-30: 0.6 mg via INTRAVENOUS

## 2011-10-30 MED ORDER — MIDAZOLAM HCL 5 MG/5ML IJ SOLN
INTRAMUSCULAR | Status: DC | PRN
  Administered 2011-10-30: 2 mg via INTRAVENOUS

## 2011-10-30 SURGICAL SUPPLY — 56 items
ADH SKN CLS APL DERMABOND .7 (GAUZE/BANDAGES/DRESSINGS) ×1
BUR RND FLUTED 2.5 (BURR) ×2 IMPLANT
BUR ROUND FLUTED 4 SOFT TCH (BURR) IMPLANT
BUR SABER RD CUTTING 3.0 (BURR) IMPLANT
CANISTER SUCTION 2500CC (MISCELLANEOUS) ×2 IMPLANT
CLOTH BEACON ORANGE TIMEOUT ST (SAFETY) ×2 IMPLANT
CORDS BIPOLAR (ELECTRODE) ×2 IMPLANT
COVER SURGICAL LIGHT HANDLE (MISCELLANEOUS) ×2 IMPLANT
DERMABOND ADVANCED (GAUZE/BANDAGES/DRESSINGS) ×1
DERMABOND ADVANCED .7 DNX12 (GAUZE/BANDAGES/DRESSINGS) ×1 IMPLANT
DRAPE C-ARM 42X72 X-RAY (DRAPES) ×2 IMPLANT
DRAPE MICROSCOPE LEICA (MISCELLANEOUS) ×2 IMPLANT
DRAPE POUCH INSTRU U-SHP 10X18 (DRAPES) ×2 IMPLANT
DRAPE PROXIMA HALF (DRAPES) ×2 IMPLANT
DRAPE SURG 17X23 STRL (DRAPES) ×8 IMPLANT
DRSG MEPILEX BORDER 4X4 (GAUZE/BANDAGES/DRESSINGS) ×2 IMPLANT
DRSG MEPILEX BORDER 4X8 (GAUZE/BANDAGES/DRESSINGS) IMPLANT
DURAPREP 26ML APPLICATOR (WOUND CARE) ×2 IMPLANT
ELECT BLADE 4.0 EZ CLEAN MEGAD (MISCELLANEOUS) ×2
ELECT CAUTERY BLADE 6.4 (BLADE) ×2 IMPLANT
ELECT REM PT RETURN 9FT ADLT (ELECTROSURGICAL) ×2
ELECTRODE BLDE 4.0 EZ CLN MEGD (MISCELLANEOUS) ×1 IMPLANT
ELECTRODE REM PT RTRN 9FT ADLT (ELECTROSURGICAL) ×1 IMPLANT
GLOVE BIO SURGEON STRL SZ 6.5 (GLOVE) ×2 IMPLANT
GLOVE BIOGEL PI IND STRL 6.5 (GLOVE) ×1 IMPLANT
GLOVE BIOGEL PI IND STRL 7.5 (GLOVE) ×1 IMPLANT
GLOVE BIOGEL PI INDICATOR 6.5 (GLOVE) ×1
GLOVE BIOGEL PI INDICATOR 7.5 (GLOVE) ×1
GLOVE ECLIPSE 7.0 STRL STRAW (GLOVE) ×4 IMPLANT
GLOVE ECLIPSE 8.5 STRL (GLOVE) ×4 IMPLANT
GLOVE SURG 8.5 LATEX PF (GLOVE) ×2 IMPLANT
GOWN PREVENTION PLUS LG XLONG (DISPOSABLE) ×2 IMPLANT
GOWN PREVENTION PLUS XXLARGE (GOWN DISPOSABLE) ×2 IMPLANT
GOWN STRL NON-REIN LRG LVL3 (GOWN DISPOSABLE) ×4 IMPLANT
KIT BASIN OR (CUSTOM PROCEDURE TRAY) ×2 IMPLANT
KIT ROOM TURNOVER OR (KITS) ×2 IMPLANT
NEEDLE 22X1 1/2 (OR ONLY) (NEEDLE) ×2 IMPLANT
NEEDLE SPNL 18GX3.5 QUINCKE PK (NEEDLE) ×4 IMPLANT
NS IRRIG 1000ML POUR BTL (IV SOLUTION) ×2 IMPLANT
PACK LAMINECTOMY ORTHO (CUSTOM PROCEDURE TRAY) ×2 IMPLANT
PAD ARMBOARD 7.5X6 YLW CONV (MISCELLANEOUS) ×4 IMPLANT
PATTIES SURGICAL .5 X.5 (GAUZE/BANDAGES/DRESSINGS) ×2 IMPLANT
PATTIES SURGICAL .75X.75 (GAUZE/BANDAGES/DRESSINGS) IMPLANT
SPONGE LAP 4X18 X RAY DECT (DISPOSABLE) ×2 IMPLANT
SPONGE SURGIFOAM ABS GEL 100 (HEMOSTASIS) ×2 IMPLANT
SUT VIC AB 1 CT1 27 (SUTURE) ×4
SUT VIC AB 1 CT1 27XBRD ANBCTR (SUTURE) ×2 IMPLANT
SUT VIC AB 2-0 CT1 27 (SUTURE) ×4
SUT VIC AB 2-0 CT1 TAPERPNT 27 (SUTURE) ×2 IMPLANT
SUT VICRYL 0 UR6 27IN ABS (SUTURE) ×2 IMPLANT
SUT VICRYL 4-0 PS2 18IN ABS (SUTURE) ×2 IMPLANT
SYR CONTROL 10ML LL (SYRINGE) ×2 IMPLANT
TOWEL OR 17X24 6PK STRL BLUE (TOWEL DISPOSABLE) ×2 IMPLANT
TOWEL OR 17X26 10 PK STRL BLUE (TOWEL DISPOSABLE) ×2 IMPLANT
TRAY FOLEY CATH 14FR (SET/KITS/TRAYS/PACK) ×2 IMPLANT
WATER STERILE IRR 1000ML POUR (IV SOLUTION) ×2 IMPLANT

## 2011-10-30 NOTE — Transfer of Care (Signed)
Immediate Anesthesia Transfer of Care Note  Patient: Antonio Adams  Procedure(s) Performed: Procedure(s) (LRB): MICRODISCECTOMY LUMBAR LAMINECTOMY (Right)  Patient Location: PACU  Anesthesia Type: General  Level of Consciousness: awake, alert  and oriented  Airway & Oxygen Therapy: Patient Spontanous Breathing and Patient connected to nasal cannula oxygen  Post-op Assessment: Report given to PACU RN, Post -op Vital signs reviewed and stable and Patient moving all extremities X 4  Post vital signs: Reviewed and stable  Complications: No apparent anesthesia complications

## 2011-10-30 NOTE — Progress Notes (Signed)
UR Completed. Llana Aliment, RN, Nurse Case Manager [phone]

## 2011-10-30 NOTE — Anesthesia Preprocedure Evaluation (Addendum)
Anesthesia Evaluation  Patient identified by MRN, date of birth, ID band Patient awake    Reviewed: Allergy & Precautions, H&P , NPO status , Patient's Chart, lab work & pertinent test results  Airway Mallampati: II      Dental  (+) Teeth Intact and Dental Advisory Given   Pulmonary sleep apnea and Continuous Positive Airway Pressure Ventilation ,  breath sounds clear to auscultation        Cardiovascular hypertension, Pt. on medications Rhythm:Regular Rate:Normal     Neuro/Psych    GI/Hepatic Neg liver ROS, GERD-  ,  Endo/Other  Diabetes mellitus-  Renal/GU negative Renal ROS     Musculoskeletal   Abdominal   Peds  Hematology negative hematology ROS (+)   Anesthesia Other Findings   Reproductive/Obstetrics                         Anesthesia Physical Anesthesia Plan  ASA: III  Anesthesia Plan: General   Post-op Pain Management:    Induction: Intravenous  Airway Management Planned:   Additional Equipment:   Intra-op Plan:   Post-operative Plan: Extubation in OR  Informed Consent:   Plan Discussed with:   Anesthesia Plan Comments:         Anesthesia Quick Evaluation

## 2011-10-30 NOTE — Anesthesia Procedure Notes (Signed)
Procedure Name: Intubation Date/Time: 10/30/2011 7:52 AM Performed by: Kyung Rudd Pre-anesthesia Checklist: Patient identified, Emergency Drugs available, Suction available, Patient being monitored and Timeout performed Patient Re-evaluated:Patient Re-evaluated prior to inductionOxygen Delivery Method: Circle system utilized Preoxygenation: Pre-oxygenation with 100% oxygen Intubation Type: IV induction Ventilation: Mask ventilation without difficulty Laryngoscope Size: Mac and 4 Grade View: Grade III Tube size: 7.5 mm Number of attempts: 1 Airway Equipment and Method: Bougie stylet Placement Confirmation: positive ETCO2 and breath sounds checked- equal and bilateral Secured at: 22 cm Tube secured with: Tape Dental Injury: Teeth and Oropharynx as per pre-operative assessment

## 2011-10-30 NOTE — Op Note (Signed)
10/30/2011  10:44 AM  PATIENT:  Antonio Adams  66 y.o. male  MRN: [id number]  OPERATIVE REPORT  PRE-OPERATIVE DIAGNOSIS:  Lateral Recess Stenosis and HNP Right L4-5 and Right L5-S1  POST-OPERATIVE DIAGNOSIS:  Lateral Recess Stenosis  Right L4-5 and Right L5-S1, HNP Recurrent Right L5-S1.  PROCEDURE:  Procedure(s): MICRODISCECTOMY LUMBAR LAMINECTOMY Right L5-S1 and L4-5.    SURGEON:  Jessy Oto, MD     ASSISTANT:  Phillips Hay, PA-C  (Present throughout the entire procedure     and necessary for completion of procedure in a timely manner)     ANESTHESIA:  General, local with Marcaine half percent with epinephrine 1-200,000. Dr. Oletta Lamas .    COMPLICATIONS:  None.   PROCEDURE:The patient was met in the holding area, and the appropriate Right Lumbar level L5-S1 and L4-5 identified and marked with "x" and my initials.The patient was then transported to OR and was placed under general anesthesia without difficulty. The patient received appropriate preoperative antibiotic prophylaxis. Foley catheter was placed sterilely. The patient after intubation atraumatically was transferred to the operating room table, prone position, Wilson frame, sliding OR table. All pressure points were well padded. The arms in 90-90 well-padded at the elbows. Standard prep with DuraPrep solution lower dorsal spine to the mid sacral segment. Draped in the usual manner iodine Vi-Drape was used. Time-out procedure was called and correct. 2x 18-gauge spinal needle was then inserted at the expected L5-S1 level.  C-arm was draped sterilely to the field and used to identify the spinal needles positions. The needle was at the lower aspect of the lamina of L5. Skin superior to this was then infiltrated with Marcaine half percent with 1-200,000 epinephrine total of 10 cc used. An incision approximately an inch inch and a half in length was then made through skin and subcutaneous layers in line with the right side of the  expected midline just superior to the spinal needle entry point. An incision made into the right lumbosacral fascia approximately an inch in length .   Smallest dilator was then introduced into the incision site and used to carefully form subperiosteal movement of the hip paralumbar muscles off of the posterior lamina of the expected L5-S1 level. Successive dilators were then carried up to the 11 mm size. The depth measured off of the dilators at about 60 mm and 60 mm retractors and placed on the scaffolding for the MIS equipment and guided over dilators down to and docking on the posterior aspect of the lamina at the expected L5-S1 level. This was sterilely attached to the articulating arm and it's up right which had been attached the OR table sterilely. C-arm fluoroscopy was identified the dilators and the retractors at the appropriate level L5-S1 and L4-5. The operating room microscope sterilely draped brought into the field. Under the operating room microscope, the L5-S1 interspace carefully debrided the small amount of muscle attachment here and high-speed bur used to drill the medial aspect of the inferior articular process of L5 approximately 10%. A localization lateral C-arm view was obtained with Penfield 4 in the L5-S1 facet. 2 mm Kerrison then used to enter the spinal canal over the superior aspect of the S1 lamina carefully using the Kerrison to debris the attachment as a curet. Foraminotomy was then performed over theS1 nerve root. The medial 10% superior articular process of S1 and then resected using an osteotome and 2 mm Kerrison. This allowed for identification of the thecal sac. Penfield 4 was then used  to carefully mobilize the thecal sac medially and the S1 nerve root identified within the lateral recess flattened over the posterior aspect of the herniated disc. Carefully the lateral aspect of the S1 nerve root was identified and a Penfield 4 was used to mobilize the nerve medially such that  the herniated disc was visible with microscope. Using a Penfield 4 for retraction and a 15 blade scalpel was used to incise the posterior longitudinal ligament within the lateral recess on the left side longitudinally. Disc material immediately extruded and this was removed using micropituitary rongeurs and nerve hook nerve root and then more easily able to be mobilized medially and retracted using a love retractor. Further foraminotomies was performed over the L5 nerve root the nerve root was noted to be decompressed.  The nerve root able to be retracted along the medial aspect of the S1pedicle and disc material found to be subligamentous at this level was further resected current pituitary rongeurs. Ligamentum flavum was further debrided superiorly to the level L5-S1 disc. Had a moderate amount of further resection of the S1 lamina inferiorly was performed. With this then the disc space at L5-S1 was easily visualized and entry into the disc at the sided disc herniation was possible using a Penfield 4 intraoperative Lateral radiograph was used to identify the L5-S1 disc with the Penfield 4 In place just below the disc space.  Micropituitary was used to further debride this material superficially from the posterior aspect of the intervertebral disc is posterior lateral aspect of the disc. Small amount of further disc material was found subligamentous extending inferiorly from the disc this was removed using micropituitary rongeurs. Ligamentum flavum was debrided and lateral recess along the medial aspect L5-S1 facet no further decompression was necessary. Ball tip nerve probe was then able to carefully palpate the neuroforamen for L5and S1 finding these to be well decompressed. Attention then turned to the right L4-5 level which was easily visualized with the microscope. Soft tissues debrided about the previous laminotomy area over the posterior aspect of the L4-5 interspace. High-speed bur and then used to  carefully drill inferior 3 or 4 mm of the right side L4 lamina and on the medial aspect of the right L4 inferior articular process of 3 mm. The superior margin of the L5 lamina then carefully debrided with curette and a 2 mm osteotome then used and the spinal canal over the superior aspect of the L5 lamina resecting bone over the superior aspect and freeing up the attachment of ligamentum flavum here. Ligamentum flavum then debrided with the 3 mm Kerrison we performed of the L. V nerve root and the lateral recess decompressed using 2 and 3 mm Kerrisons sizing hypertrophic reflected ligamentum flavum extending superiorly. From was resected off the ventral aspect of the inferior margin of the L4 lamina. Hockey-stick nerve probe could then be passed out the L4 neuroforamen of the L5 neuroforamen. Venous bleeding encountered. Thrombin-soaked Gelfoam used to control this following this then the sac and the L5 nerve root were mobilized medially and the L4-5 disc examined and found not to be herniated. Irrigation was carried out down to this bleeding controlled with Gelfoam. Gelfoam was then removed. Irrigation carried careful examination demonstrated no active bleeding present. Retractors were then carefully removed Since carefully then the Bleeding was then controlled using thrombin-soaked Gelfoam small cottonoids.  Small amount of bleeding within the soft tissue mass the laminotomy area was controlled using bipolar electrocautery. Irrigation was carried out using copious amounts of  irrigant solution. All Gelfoam  were then removed. No significant active bleeding present at the time of removal. All instruments sponge counts were correct traction system was then carefully removed carefully rotating retractors with this withdrawal and only bipolar electrocautery of any small bleeders. Lumbodorsal fascia was then carefully approximated with interrupted 0 Vicryl sutures, UR 6 needle deep subcutaneous layers were approximated  with interrupted 0 Vicryl sutures on UR 6 the appear subcutaneous layers approximated with interrupted 2-0 Vicryl sutures and the skin closed with a running subcutaneous stitch of 4-0 Vicryl. Dermabond was applied allowed to dry and then Mepilex bandage applied. Patient was then carefully returned to supine position on a stretcher, reactivated and extubated. He was then returned to recovery room in satisfactory condition.  Phillips Hay PA-C perform the duties of assistant surgeon during this case. She was present from the beginning of the case to the end of the case assisting in transfer the patient from his stretcher to the OR table and back to the stretcher at the end of the case. Assisted in careful retraction and suction of the laminectomy site delicate neural structures operating under the operating room microscope. She performed closure of the incision from the fascia to the skin applying the dressing.     Emoree Sasaki E 10/30/2011, 10:44 AM

## 2011-10-30 NOTE — Brief Op Note (Signed)
10/30/2011  10:38 AM  PATIENT:  Antonio Adams  66 y.o. male  PRE-OPERATIVE DIAGNOSIS:  Lateral Recess Stenosis and HNP Right L4-5 and Right L5-S1  POST-OPERATIVE DIAGNOSIS:  Lateral Recess Stenosis Right L4-5 and Right L5-S1, HNP central and right L5-S1  PROCEDURE:  Procedure(s) (LRB): MICRODISCECTOMY LUMBAR LAMINECTOMY (Right) L4-5 and L5-S1  SURGEON:  Surgeon(s) and Role:     Jessy Oto, MD - Primary  PHYSICIAN ASSISTANT: Phillips Hay PA-C  ANESTHESIA:   local, general and 10cc marcaine 1/2% with epi.  EBL:  Total I/O In: 1000 [I.V.:1000] Out: 250 [Urine:200; Blood:50]  BLOOD ADMINISTERED:none  DRAINS: Urinary Catheter (Foley)   LOCAL MEDICATIONS USED:  MARCAINE    and Amount: 10 ml  SPECIMEN:  No Specimen  DISPOSITION OF SPECIMEN:  N/A  COUNTS:  YES  TOURNIQUET:  * No tourniquets in log *  DICTATION: .Dragon Dictation  PLAN OF CARE: Admit for overnight observation  PATIENT DISPOSITION:  PACU - hemodynamically stable.   Delay start of Pharmacological VTE agent (>24hrs) due to surgical blood loss or risk of bleeding: yes

## 2011-10-30 NOTE — H&P (Signed)
Patient was seen and examined in the preop holding area. There has been no interval  Change in this patient's exam preop  history and physical exam  Lab tests and images have been examined and reviewed.  The Risks benefits and alternative treatments have been discussed  extensively,questions answered.  The patient has elected to undergo the discussed surgical treatment. 

## 2011-10-30 NOTE — Anesthesia Postprocedure Evaluation (Signed)
  Anesthesia Post-op Note  Patient: Antonio Adams  Procedure(s) Performed: Procedure(s) (LRB): MICRODISCECTOMY LUMBAR LAMINECTOMY (Right)  Patient Location: PACU  Anesthesia Type: General  Level of Consciousness: awake  Airway and Oxygen Therapy: Patient Spontanous Breathing  Post-op Pain: mild  Post-op Assessment: Post-op Vital signs reviewed  Post-op Vital Signs: Reviewed  Complications: No apparent anesthesia complications

## 2011-10-30 NOTE — Preoperative (Signed)
Beta Blockers   Reason not to administer Beta Blockers:Not Applicable 

## 2011-10-31 ENCOUNTER — Encounter (HOSPITAL_COMMUNITY): Payer: Self-pay | Admitting: Specialist

## 2011-10-31 MED ORDER — SENNOSIDES-DOCUSATE SODIUM 8.6-50 MG PO TABS: 1.0000 | ORAL_TABLET | Freq: Every evening | ORAL | Status: DC | PRN

## 2011-10-31 MED ORDER — HYDROCODONE-ACETAMINOPHEN 7.5-500 MG PO TABS: 1.0000 | ORAL_TABLET | ORAL | Status: AC | PRN

## 2011-10-31 MED ORDER — IBUPROFEN 800 MG PO TABS: 800.0000 mg | ORAL_TABLET | Freq: Three times a day (TID) | ORAL | Status: AC | PRN

## 2011-10-31 MED ORDER — SENNA-DOCUSATE SODIUM 8.6-50 MG PO TABS: 1.0000 | ORAL_TABLET | Freq: Every day | ORAL | Status: DC

## 2011-10-31 MED ORDER — HYDROCODONE-ACETAMINOPHEN 5-325 MG PO TABS: 1.0000 | ORAL_TABLET | Freq: Four times a day (QID) | ORAL | Status: DC | PRN

## 2011-10-31 MED ORDER — METHOCARBAMOL 500 MG PO TABS: 500.0000 mg | ORAL_TABLET | Freq: Three times a day (TID) | ORAL | Status: DC

## 2011-10-31 NOTE — Discharge Instructions (Signed)
    No lifting greater than 10 lbs. Avoid bending, stooping and twisting. Walk in house for first week them may start to get out slowly increasing distance up to one mile by 3 weeks post op. Keep incision dry for 3 days, may use tegaderm or similar water impervious dressing.  

## 2011-10-31 NOTE — Evaluation (Signed)
Physical Therapy Evaluation Patient Details Name: Antonio Adams MRN: [id number] DOB: [birth year] Today's Date: 10/31/2011 Time: [id number] PT Time Calculation (min): 22 min  PT Assessment / Plan / Recommendation Clinical Impression  Pt is 66 y/o male admitted for s/p MICRODISCECTOMY LUMBAR LAMINECTOMY Right L5-S1 and L4-5.  Pt moving well and able to perform stair negotiation.  No further PT needs.  PT will sign off.    PT Assessment  Patent does not need any further PT services    Follow Up Recommendations  No PT follow up    Barriers to Discharge        lEquipment Recommendations  None recommended by PT    Recommendations for Other Services     Frequency      Precautions / Restrictions Precautions Precautions: Back Precaution Booklet Issued: Yes (comment) Precaution Comments: Reviewed back precautions   Pertinent Vitals/Pain No c/o pain; did c/o back soreness      Mobility  Bed Mobility Bed Mobility: Not assessed Transfers Transfers: Sit to Stand;Stand to Sit Sit to Stand: 7: Independent;From chair/3-in-1 Stand to Sit: 7: Independent;To chair/3-in-1 Ambulation/Gait Ambulation/Gait Assistance: 6: Modified independent (Device/Increase time) Ambulation Distance (Feet): 200 Feet Assistive device: None Ambulation/Gait Assistance Details: Pt with antalgic gait. Gait Pattern: Antalgic;Decreased trunk rotation Stairs: Yes Stairs Assistance: 6: Modified independent (Device/Increase time) Stair Management Technique: One rail Right;Forwards Number of Stairs: 10     Exercises     PT Diagnosis:    PT Problem List:   PT Treatment Interventions:     PT Goals    Visit Information  Last PT Received On: 10/31/11 Assistance Needed: +1    Subjective Data  Subjective: "I'm ready to go home." Patient Stated Goal: Go home today   Prior Functioning  Home Living Lives With: Spouse Available Help at Discharge: Family Type of Home: House Home Access: Level  entry Home Layout: Able to live on main level with bedroom/bathroom Bathroom Shower/Tub: Tub/shower unit;Curtain Biochemist, clinical: Standard Bathroom Accessibility: Yes How Accessible: Accessible via walker Home Adaptive Equipment: Straight cane Prior Function Level of Independence: Independent Able to Take Stairs?: Yes Driving: Yes Vocation: Retired Corporate investment banker: No difficulties Dominant Hand: Right    Cognition  Overall Cognitive Status: Appears within functional limits for tasks assessed/performed Arousal/Alertness: Awake/alert Orientation Level: Appears intact for tasks assessed Behavior During Session: [facility] for tasks performed    Extremity/Trunk Assessment Right Upper Extremity Assessment RUE ROM/Strength/Tone: Within functional levels Left Upper Extremity Assessment LUE ROM/Strength/Tone: Within functional levels Right Lower Extremity Assessment RLE ROM/Strength/Tone: Within functional levels RLE Sensation: WFL - Light Touch Left Lower Extremity Assessment LLE ROM/Strength/Tone: Within functional levels LLE Sensation: WFL - Light Touch   Balance    End of Session PT - End of Session Equipment Utilized During Treatment: Gait belt Activity Tolerance: Patient tolerated treatment well Patient left: in chair;with call bell/phone within reach;with family/visitor present Nurse Communication: Mobility status;Precautions   Aydrien Froman 10/31/2011, 10:58 AM Antoine Poche, [place] DPT [phone]

## 2011-10-31 NOTE — Progress Notes (Signed)
Subjective: 1 Day Post-Op Procedure(s) (LRB): MICRODISCECTOMY LUMBAR LAMINECTOMY (Right) Awake alert oriented x4 out of bed into a regular. Starting therapy today he is mild discomfort. Patient reports pain as mild.    Objective: Vital signs in last 24 hours: Temp:  [96.6 F (35.9 C)-98.9 F (37.2 C)] 98.8 F (37.1 C) (06/18 0536) Pulse Rate:  [59-80] 67  (06/18 0207) Resp:  [18-30] 18  (06/18 0536) BP: (100-141)/(61-79) 131/73 mmHg (06/18 0536) SpO2:  [95 %-100 %] 100 % (06/18 0536)  Intake/Output from previous day: 06/17 0701 - 06/18 0700 In: 2380 [P.O.:480; I.V.:1900] Out: 1550 [Urine:1500; Blood:50] Intake/Output this shift:    No results found for this basename: HGB:5 in the last 72 hours No results found for this basename: WBC:2,RBC:2,HCT:2,PLT:2 in the last 72 hours  Basename 10/30/11 0630  NA 140  K 4.3  CL 103  CO2 22  BUN 26*  CREATININE 1.30  GLUCOSE 97  CALCIUM 10.0   No results found for this basename: LABPT:2,INR:2 in the last 72 hours  Neurologically intact ABD soft Sensation intact distally Intact pulses distally Dorsiflexion/Plantar flexion intact Incision: no drainage Foley has been discontinued today.  Assessment/Plan: 1 Day Post-Op Procedure(s) (LRB): MICRODISCECTOMY LUMBAR LAMINECTOMY (Right) Advance diet D/C IV fluids Discharge home with home health  Liah Morr E 10/31/2011, 10:26 AM

## 2011-10-31 NOTE — Discharge Summary (Addendum)
Physician Discharge Summary  Patient ID: Antonio Adams MRN: [id number] DOB/AGE: 1945-11-12 66 y.o.  Admit date: 10/30/2011 Discharge date: 10/31/2011  Admission Diagnoses:  Principal Problem:  *Spinal stenosis of lumbar region with radiculopathy Active Problems:  BACK PAIN WITH RADICULOPATHY   Discharge Diagnoses:  Same  Past Medical History  Diagnosis Date  . DIABETES MELLITUS, TYPE II 03/16/2007  . HYPERLIPIDEMIA 03/16/2007  . CARPAL TUNNEL SYNDROME, BILATERAL 03/16/2007  . HYPERTENSION 03/16/2007  . ALLERGIC RHINITIS 03/16/2007  . GERD 03/16/2007  . BACK PAIN WITH RADICULOPATHY 11/23/2008  . LEG CRAMPS 05/22/2007  . PARESTHESIA 05/21/2007  . GASTROINTESTINAL HEMORRHAGE, HX OF 03/16/2007  . Overactive bladder   . Hemorrhoids   . Sleep apnea     CPAP USED FOR 2 YRS   . ADENOCARCINOMA, PROSTATE, GLEASON GRADE 6 12/21/2009    Surgeries: Procedure(s): MICRODISCECTOMY LUMBAR LAMINECTOMY on 10/30/2011   Consultants:    Discharged Condition: Improved  Hospital Course: Antonio Adams is an 66 y.o. male who was admitted 10/30/2011 with a chief complaint of No chief complaint on file. , and found to have a diagnosis of Spinal stenosis of lumbar region with radiculopathy.  They were brought to the operating room on 10/30/2011 and underwent the above named procedures.    They were given perioperative antibiotics:  Anti-infectives     Start     Dose/Rate Route Frequency Ordered Stop   10/30/11 1600   ceFAZolin (ANCEF) IVPB 1 g/50 mL premix        1 g 100 mL/hr over 30 Minutes Intravenous Every 8 hours 10/30/11 1306 10/31/11 0040   10/29/11 1340   ceFAZolin (ANCEF) IVPB 2 g/50 mL premix        2 g 100 mL/hr over 30 Minutes Intravenous 60 min pre-op 10/29/11 1340 10/30/11 0800        . On postoperative day #1 dressing was changed incision dry physical therapy saw the patient Foley catheter was then discontinued patient voided without difficulty. With physical therapy ambulated  in the hallway and stair climbing. Completed 24 hours hours of perioperative antibiotics. Discharge home with discharge instructions .At this time patient is alert and oriented neurovascularly normal strength in both lower extremities sciatic tension tests negative pain at the time of discharge.  They were given sequential compression devices, early ambulation, and chemoprophylaxis for DVT prophylaxis.  They benefited maximally from their hospital stay and there were no complications.    Recent vital signs:  Filed Vitals:   10/31/11 0536  BP: 131/73  Pulse:   Temp: 98.8 F (37.1 C)  Resp: 18    Recent laboratory studies:  Results for orders placed during the hospital encounter of 10/30/11  GLUCOSE, CAPILLARY      Component Value Range   Glucose-Capillary 86  70 - 99 mg/dL  BASIC METABOLIC PANEL      Component Value Range   Sodium 140  135 - 145 mEq/L   Potassium 4.3  3.5 - 5.1 mEq/L   Chloride 103  96 - 112 mEq/L   CO2 22  19 - 32 mEq/L   Glucose, Bld 97  70 - 99 mg/dL   BUN 26 (*) 6 - 23 mg/dL   Creatinine, Ser 1.30  0.50 - 1.35 mg/dL   Calcium 10.0  8.4 - 10.5 mg/dL   GFR calc non Af Amer 56 (*) >90 mL/min   GFR calc Af Amer 65 (*) >90 mL/min  GLUCOSE, CAPILLARY      Component Value Range  Glucose-Capillary 92  70 - 99 mg/dL  GLUCOSE, CAPILLARY      Component Value Range   Glucose-Capillary 86  70 - 99 mg/dL  GLUCOSE, CAPILLARY      Component Value Range   Glucose-Capillary 70  70 - 99 mg/dL  GLUCOSE, CAPILLARY      Component Value Range   Glucose-Capillary 71  70 - 99 mg/dL  GLUCOSE, CAPILLARY      Component Value Range   Glucose-Capillary 113 (*) 70 - 99 mg/dL  GLUCOSE, CAPILLARY      Component Value Range   Glucose-Capillary 113 (*) 70 - 99 mg/dL    Discharge Medications:   Medication List  As of 10/31/2011 10:57 AM   TAKE these medications         aspirin 325 MG tablet   Take 325 mg by mouth daily.      Cholecalciferol 1000 UNITS capsule   Take 1,000  Units by mouth daily.      diltiazem 120 MG 24 hr capsule   Commonly known as: DILACOR XR   Take 120 mg by mouth daily.      gabapentin 100 MG capsule   Commonly known as: NEURONTIN   Take 100 mg by mouth at bedtime.      gemfibrozil 600 MG tablet   Commonly known as: LOPID   Take 600 mg by mouth 2 (two) times daily.      glucose blood test strip   1 each by Other route 2 (two) times daily. And lancets 2/day 250.03      HYDROcodone-acetaminophen 7.5-500 MG per tablet   Commonly known as: LORTAB   Take 1 tablet by mouth every 4 (four) hours as needed for pain.      ibuprofen 800 MG tablet   Commonly known as: ADVIL,MOTRIN   Take 1 tablet (800 mg total) by mouth every 8 (eight) hours as needed for pain.      lisinopril 40 MG tablet   Commonly known as: PRINIVIL,ZESTRIL   Take 20 mg by mouth daily.      metFORMIN 1000 MG tablet   Commonly known as: GLUCOPHAGE   Take 1 tablet (1,000 mg total) by mouth 2 (two) times daily with a meal.      multivitamin tablet   Take 1 tablet by mouth daily.      OMEGA 3 1200 MG Caps   Take 1,200 mg by mouth daily.      omeprazole 20 MG capsule   Commonly known as: PRILOSEC   Take 20 mg by mouth daily.      sennosides-docusate sodium 8.6-50 MG tablet   Commonly known as: SENOKOT-S   Take 1 tablet by mouth daily.      sitaGLIPtin 100 MG tablet   Commonly known as: JANUVIA   Take 1 tablet (100 mg total) by mouth daily.      TOVIAZ 4 MG Tb24   Generic drug: fesoterodine   Take 4 mg by mouth daily.            Diagnostic Studies: Dg Chest 2 View  10/26/2011  *RADIOLOGY REPORT*  Clinical Data: Preop for lumbar surgery  CHEST - 2 VIEW  Comparison: 07/21/2010  Findings: Cardiomediastinal silhouette is stable.  No acute infiltrate or pleural effusion.  No pulmonary edema.  Stable degenerative changes thoracic spine.  IMPRESSION:  No active disease.  No significant change  Original Report Authenticated By: Lahoma Crocker, M.D.   Dg Lumbar  Spine 1 View  10/30/2011  *  RADIOLOGY REPORT*  Clinical Data: L4 - S1 microdiskectomy  LUMBAR SPINE - 1 VIEW, DG C-ARM 1-60 MIN  Comparison: Lumbar spine MRI - 04/15/2011  Findings:  A single lateral spot intraoperative radiograph of the lower lumbar spine is provided for review.  Lumbar spine labeling is based on preprocedural lumbar spine MRI.  A radiopaque marking needle is seen overlying the soft tissues posterior to the L5 - S1 intervertebral disc space.  Additional localizing surgical instrument tips are seen overlying the soft tissues posterior to the L4 - L5 intervertebral disc space and posterior to the S1 vertebral body.  IMPRESSION: Intraoperative lumbar spine localization as above.  Original Report Authenticated By: Rachel Moulds, M.D.   Dg C-arm 1-60 Min  10/30/2011  *RADIOLOGY REPORT*  Clinical Data: L4 - S1 microdiskectomy  LUMBAR SPINE - 1 VIEW, DG C-ARM 1-60 MIN  Comparison: Lumbar spine MRI - 04/15/2011  Findings:  A single lateral spot intraoperative radiograph of the lower lumbar spine is provided for review.  Lumbar spine labeling is based on preprocedural lumbar spine MRI.  A radiopaque marking needle is seen overlying the soft tissues posterior to the L5 - S1 intervertebral disc space.  Additional localizing surgical instrument tips are seen overlying the soft tissues posterior to the L4 - L5 intervertebral disc space and posterior to the S1 vertebral body.  IMPRESSION: Intraoperative lumbar spine localization as above.  Original Report Authenticated By: Rachel Moulds, M.D.    Disposition: 01-Home or Self Care  Discharge Orders    Future Orders Please Complete By Expires   Diet - low sodium heart healthy      Call MD / Call 911      Comments:   If you experience chest pain or shortness of breath, CALL 911 and be transported to the hospital emergency room.  If you develope a fever above 101 F, pus (white drainage) or increased drainage or redness at the wound, or calf pain,  call your surgeon's office.   Constipation Prevention      Comments:   Drink plenty of fluids.  Prune juice may be helpful.  You may use a stool softener, such as Colace (over the counter) 100 mg twice a day.  Use MiraLax (over the counter) for constipation as needed.   Increase activity slowly as tolerated      Discharge instructions      Comments:   No lifting greater than 10 lbs. Avoid bending, stooping and twisting. Walk in house for first week them may start to get out slowly increasing distance up to one mile by 3 weeks post op. Keep incision dry for 3 days, may use tegaderm or similar water impervious dressing.   Driving restrictions      Comments:   No driving for 2 weeks   Lifting restrictions      Comments:   No lifting for 4-6 weeks      Follow-up Information    Follow up with Sundy Houchins E, MD. Schedule an appointment as soon as possible for a visit in 2 weeks.   Contact information:   [facility] [address] [place] [phone]            Signed: Jessy Oto 10/31/2011, 10:57 AM

## 2011-12-09 ENCOUNTER — Other Ambulatory Visit: Payer: Self-pay | Admitting: Internal Medicine

## 2012-01-10 ENCOUNTER — Other Ambulatory Visit: Payer: Self-pay | Admitting: Internal Medicine

## 2012-02-03 ENCOUNTER — Ambulatory Visit (INDEPENDENT_AMBULATORY_CARE_PROVIDER_SITE_OTHER): Payer: Medicare Other | Admitting: Internal Medicine

## 2012-02-03 ENCOUNTER — Encounter: Payer: Self-pay | Admitting: Internal Medicine

## 2012-02-03 VITALS — BP 118/74 | HR 81 | Temp 98.5°F | Ht 72.0 in | Wt 243.0 lb

## 2012-02-03 DIAGNOSIS — IMO0002 Reserved for concepts with insufficient information to code with codable children: Secondary | ICD-10-CM

## 2012-02-03 DIAGNOSIS — G479 Sleep disorder, unspecified: Secondary | ICD-10-CM

## 2012-02-03 DIAGNOSIS — I10 Essential (primary) hypertension: Secondary | ICD-10-CM

## 2012-02-03 DIAGNOSIS — R300 Dysuria: Secondary | ICD-10-CM

## 2012-02-03 DIAGNOSIS — G4733 Obstructive sleep apnea (adult) (pediatric): Secondary | ICD-10-CM

## 2012-02-03 DIAGNOSIS — Z8719 Personal history of other diseases of the digestive system: Secondary | ICD-10-CM | POA: Insufficient documentation

## 2012-02-03 DIAGNOSIS — E119 Type 2 diabetes mellitus without complications: Secondary | ICD-10-CM

## 2012-02-03 DIAGNOSIS — R350 Frequency of micturition: Secondary | ICD-10-CM

## 2012-02-03 DIAGNOSIS — C61 Malignant neoplasm of prostate: Secondary | ICD-10-CM

## 2012-02-03 LAB — POCT URINALYSIS DIPSTICK
Bilirubin, UA: NEGATIVE
Glucose, UA: NEGATIVE
Spec Grav, UA: 1.02

## 2012-02-03 MED ORDER — CIPROFLOXACIN HCL 500 MG PO TABS: 500.0000 mg | ORAL_TABLET | Freq: Two times a day (BID) | ORAL | Status: DC

## 2012-02-03 NOTE — Progress Notes (Signed)
  Subjective:    Patient ID: Antonio Adams, male    DOB: February 17, 1946, 66 y.o.   MRN: [id number]  HPI Patient comes in today for SDA for  new problem evaluation.  Saturday clinic. PCP NA when called on Thursday.   This week .  CO  Painful urination for 1-2 weeks and frequency and some urgency that is above baseline . No blood seen in urine . Has mild righ abd discomfort that comes and goes but no NVD vomiting  But some anorexia Hx of surgery prostate surgery  Sees Dr Karsten Ro  next month.  ; hx of rectal bleeding and has had Dr Henrene Pastor check out. also back surgery.  Took mom   Last pm  Some constipation  Onset with last 2 weeks.  Review of Systems No cp sob bruising  Syncope concern about sleep interrupted for a few months  Sleep couple hours at a time  Has osa under care.  Uncertain as to cause of sleep issue   Asks about ambien.  Dm seems under control and no new meds .  Past history family history social history reviewed in the electronic medical record.    Objective:   Physical Exam BP 118/74  Pulse 81  Temp 98.5 F (36.9 C) (Oral)  Ht 6' (1.829 m)  Wt 243 lb (110.224 kg)  BMI 32.96 kg/m2  SpO2 95% WDWN in nad  Alert non toxic  Neck: Supple without adenopathy or masses or bruits Chest:  Clear to A&P without wheezes rales or rhonchi CV:  S1-S2 no gallops or murmurs peripheral perfusion is normal Abdomen:  Sof,t normal bowel sounds without hepatosplenomegaly, no guarding rebound or masses no CVA tenderness right lower abd without g or r or mass Neg psoas sign  No clubbing cyanosis  UA clear but rec heme     Assessment & Plan:  Urinary  Frequency   Increase   Upon baseline   ? If infection vs other.   At this time cause of increase risk will add antibiotic for coverage and have urology see him  Has a mild inter rlq discomfort    No acute abd process obvious.   No hx of se with antibiotic   Risk benefit discussed . Co morbid conditions: Dm HT  Back radicular   Stable Prostate  cancer  More recent dx.  Sleep problem   Disc would advise he disc this with PCP Dr Linda Hedges about  Options for help  Counseled.  Sleep apnea under rx . Sleep hygiene and issues in the interim. Total visit 9mins > 50% spent counseling and coordinating care

## 2012-02-03 NOTE — Patient Instructions (Addendum)
Because of the change in your urinary pattern we will treat for  Infection. See your urologist in follow up if not all better  Next week. Or if worse. Most people have no sig side effects of this antibiotic but if concerns call  For advice.   Many things  Can interfere with sleep . Advise appt with Dr Linda Hedges or even your sleep doctor to discuss best approach for treatment.   Insomnia Insomnia is frequent trouble falling and/or staying asleep. Insomnia can be a long term problem or a short term problem. Both are common. Insomnia can be a short term problem when the wakefulness is related to a certain stress or worry. Long term insomnia is often related to ongoing stress during waking hours and/or poor sleeping habits. Overtime, sleep deprivation itself can make the problem worse. Every little thing feels more severe because you are overtired and your ability to cope is decreased. CAUSES   Stress, anxiety, and depression.   Poor sleeping habits.   Distractions such as TV in the bedroom.   Naps close to bedtime.   Engaging in emotionally charged conversations before bed.   Technical reading before sleep.   Alcohol and other sedatives. They may make the problem worse. They can hurt normal sleep patterns and normal dream activity.   Stimulants such as caffeine for several hours prior to bedtime.   Pain syndromes and shortness of breath can cause insomnia.   Exercise late at night.   Changing time zones may cause sleeping problems (jet lag).  It is sometimes helpful to have someone observe your sleeping patterns. They should look for periods of not breathing during the night (sleep apnea). They should also look to see how long those periods last. If you live alone or observers are uncertain, you can also be observed at a sleep clinic where your sleep patterns will be professionally monitored. Sleep apnea requires a checkup and treatment. Give your caregivers your medical history. Give your  caregivers observations your family has made about your sleep.  SYMPTOMS   Not feeling rested in the morning.   Anxiety and restlessness at bedtime.   Difficulty falling and staying asleep.  TREATMENT   Your caregiver may prescribe treatment for an underlying medical disorders. Your caregiver can give advice or help if you are using alcohol or other drugs for self-medication. Treatment of underlying problems will usually eliminate insomnia problems.   Medications can be prescribed for short time use. They are generally not recommended for lengthy use.   Over-the-counter sleep medicines are not recommended for lengthy use. They can be habit forming.   You can promote easier sleeping by making lifestyle changes such as:   Using relaxation techniques that help with breathing and reduce muscle tension.   Exercising earlier in the day.   Changing your diet and the time of your last meal. No night time snacks.   Establish a regular time to go to bed.   Counseling can help with stressful problems and worry.   Soothing music and white noise may be helpful if there are background noises you cannot remove.   Stop tedious detailed work at least one hour before bedtime.  HOME CARE INSTRUCTIONS   Keep a diary. Inform your caregiver about your progress. This includes any medication side effects. See your caregiver regularly. Take note of:   Times when you are asleep.   Times when you are awake during the night.   The quality of your sleep.  How you feel the next day.  This information will help your caregiver care for you.  Get out of bed if you are still awake after 15 minutes. Read or do some quiet activity. Keep the lights down. Wait until you feel sleepy and go back to bed.   Keep regular sleeping and waking hours. Avoid naps.   Exercise regularly.   Avoid distractions at bedtime. Distractions include watching television or engaging in any intense or detailed activity like  attempting to balance the household checkbook.   Develop a bedtime ritual. Keep a familiar routine of bathing, brushing your teeth, climbing into bed at the same time each night, listening to soothing music. Routines increase the success of falling to sleep faster.   Use relaxation techniques. This can be using breathing and muscle tension release routines. It can also include visualizing peaceful scenes. You can also help control troubling or intruding thoughts by keeping your mind occupied with boring or repetitive thoughts like the old concept of counting sheep. You can make it more creative like imagining planting one beautiful flower after another in your backyard garden.   During your day, work to eliminate stress. When this is not possible use some of the previous suggestions to help reduce the anxiety that accompanies stressful situations.  MAKE SURE YOU:   Understand these instructions.   Will watch your condition.   Will get help right away if you are not doing well or get worse.  Document Released: 04/28/2000 Document Revised: 04/20/2011 Document Reviewed: 05/29/2007 [facility] Patient Information 2012 [place].

## 2012-02-13 ENCOUNTER — Ambulatory Visit (INDEPENDENT_AMBULATORY_CARE_PROVIDER_SITE_OTHER): Payer: Medicare Other | Admitting: Internal Medicine

## 2012-02-13 ENCOUNTER — Encounter: Payer: Self-pay | Admitting: Internal Medicine

## 2012-02-13 VITALS — BP 142/80 | HR 94 | Temp 98.6°F | Resp 16 | Wt 244.0 lb

## 2012-02-13 DIAGNOSIS — G479 Sleep disorder, unspecified: Secondary | ICD-10-CM

## 2012-02-13 DIAGNOSIS — K219 Gastro-esophageal reflux disease without esophagitis: Secondary | ICD-10-CM

## 2012-02-13 MED ORDER — ZOLPIDEM TARTRATE 5 MG PO TABS: 5.0000 mg | ORAL_TABLET | Freq: Every evening | ORAL | Status: DC | PRN

## 2012-02-13 MED ORDER — OMEPRAZOLE 40 MG PO CPDR: 40.0000 mg | DELAYED_RELEASE_CAPSULE | Freq: Every day | ORAL | Status: DC

## 2012-02-13 NOTE — Progress Notes (Signed)
Subjective:    Patient ID: Antonio Adams, male    DOB: October 31, 1945, 66 y.o.   MRN: [id number]  HPI Patient was seen 9/21 for UTI - he is finishing his cipro Rx and is feeling much better.  He has OSA and uses CPAP followed by the [place]. He was seen 2 months ago and was told he is doing fine. He is still having disrupted sleep.  He reports that his acid reflux is not controlled on omeprazole 20 mg once a day.   Past Medical History  Diagnosis Date  . DIABETES MELLITUS, TYPE II 03/16/2007  . HYPERLIPIDEMIA 03/16/2007  . CARPAL TUNNEL SYNDROME, BILATERAL 03/16/2007  . HYPERTENSION 03/16/2007  . ALLERGIC RHINITIS 03/16/2007  . GERD 03/16/2007  . BACK PAIN WITH RADICULOPATHY 11/23/2008  . LEG CRAMPS 05/22/2007  . PARESTHESIA 05/21/2007  . GASTROINTESTINAL HEMORRHAGE, HX OF 03/16/2007  . Overactive bladder   . Hemorrhoids   . Sleep apnea     CPAP USED FOR 2 YRS   . ADENOCARCINOMA, PROSTATE, GLEASON GRADE 6 12/21/2009   Past Surgical History  Procedure Date  . Back surgery 1983, 2008    repeat surgery; ESI '08  . Shoulder surgery LFT  . Carpal tunnel release     bilateral  . Stress cardiolite 12/24/2003    negative for ischemia  . Wound exploration     management of a wound that he sustained in the [employer]  . Prostate surgery march 2012    seed implant  . Cervical fusion   . Cardiac catheterization 06/28/2010    non-obstructive disease-?NOT SURE THIS WAS DONE  . Lumbar laminectomy 10/30/2011    Procedure: MICRODISCECTOMY LUMBAR LAMINECTOMY;  Surgeon: Jessy Oto, MD;  Location: [facility];  Service: Orthopedics;  Laterality: Right;  Right L4-5 and L5-S1 Microdiscectomy   Family History  Problem Relation Age of Onset  . Cancer Sister 30    Cancer  . Hypertension Other   . Colon cancer Neg Hx   . Esophageal cancer Neg Hx   . Stomach cancer Neg Hx    History   Social History  . Marital Status: Married    Spouse Name: N/A    Number of Children: 2  . Years of Education: 12+    Occupational History  . Event organiser; Curator     retired from Event organiser   Social History Main Topics  . Smoking status: Never Smoker   . Smokeless tobacco: Never Used  . Alcohol Use: No  . Drug Use: No  . Sexually Active: Yes -- Male partner(s)   Other Topics Concern  . Not on file   Social History Narrative   Married-divorced '89, remarried '96. 2 sons, 1 daughter. 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 He wishes to be a full resuscitation candidate. Daily caffeine     Current Outpatient Prescriptions on File Prior to Visit  Medication Sig Dispense Refill  . aspirin 325 MG tablet Take 325 mg by mouth daily.        . Cholecalciferol 1000 UNITS capsule Take 1,000 Units by mouth daily.       . ciprofloxacin (CIPRO) 500 MG tablet Take 1 tablet (500 mg total) by mouth 2 (two) times daily.  20 tablet  0  . CRESTOR 20 MG tablet TAKE 1 TABLET AT BEDTIME  30 tablet  10  . diltiazem (DILACOR XR) 120 MG 24 hr capsule Take 120 mg by mouth daily.        Marland Kitchen  fesoterodine (TOVIAZ) 4 MG TB24 Take 4 mg by mouth daily.      Marland Kitchen gabapentin (NEURONTIN) 100 MG capsule Take 100 mg by mouth at bedtime.      Marland Kitchen gemfibrozil (LOPID) 600 MG tablet Take 600 mg by mouth 2 (two) times daily.       Marland Kitchen glucose blood (ONE TOUCH ULTRA TEST) test strip 1 each by Other route 2 (two) times daily. And lancets 2/day 250.03  100 each  12  . JANUVIA 100 MG tablet TAKE 1 TABLET BY MOUTH EVERY DAY  30 tablet  5  . lisinopril (PRINIVIL,ZESTRIL) 40 MG tablet Take 20 mg by mouth daily.       . metFORMIN (GLUCOPHAGE) 1000 MG tablet Take 1 tablet (1,000 mg total) by mouth 2 (two) times daily with a meal.  60 tablet  1  . Multiple Vitamin (MULTIVITAMIN) tablet Take 1 tablet by mouth daily.        . OMEGA 3 1200 MG CAPS Take 1,200 mg by mouth daily.      Marland Kitchen omeprazole (PRILOSEC) 20 MG capsule Take 20 mg by mouth daily.        . sennosides-docusate sodium  (SENOKOT-S) 8.6-50 MG tablet Take 1 tablet by mouth daily.  30 tablet  1      Review of Systems System review is negative for any constitutional, cardiac, pulmonary, GI or neuro symptoms or complaints other than as described in the HPI.       Objective:   Physical Exam Filed Vitals:   02/13/12 1313  BP: 142/80  Pulse: 94  Temp: 98.6 F (37 C)  Resp: 16   Weight: 244 lb (110.678 kg)  Gen'l- WNWD AA man in no distress HEENT- C&S clear Cor- RRR PUlm - normal respirations, no rales or wheezes Neuro - A&O x 3, normal gait        Assessment & Plan:  1. UTI - doing much better. Plan - complete antibiotics

## 2012-02-13 NOTE — Patient Instructions (Addendum)
Urinary track infection - seems to be getting better  Acid reflux - poorly controlled. Plan - take the omeprazole 40 mg every morning  For breakthrough reflux in the PM - take zantac 150 mg.  Sleep problem - continue with CPAP. May take ambien - generic.  Sleep is a learned or unlearned behavior. 5 principles of sleep hygiene - 1) regular hour to retire and rise 7days/wk 2) no stimulants - caffeine, chocolat, alcohol, 3) regular exercise  - every afternoon  4) sleep sanctuary - a space that is right light, temperature, sound level, good bed where all you do is sleep. 5) No extinction behaviors, e.g. Laying in bed awake doing anything but sleeping. This means if you have a bad night - no naps, etc

## 2012-02-15 NOTE — Assessment & Plan Note (Signed)
Acid reflux - poorly controlled. Plan - take the omeprazole 40 mg every morning  For breakthrough reflux in the PM - take zantac 150 mg.

## 2012-02-15 NOTE — Assessment & Plan Note (Signed)
Sleep problem - continue with CPAP. May take ambien - generic.  Sleep is a learned or unlearned behavior. 5 principles of sleep hygiene - 1) regular hour to retire and rise 7days/wk 2) no stimulants - caffeine, chocolat, alcohol, 3) regular exercise  - every afternoon  4) sleep sanctuary - a space that is right light, temperature, sound level, good bed where all you do is sleep. 5) No extinction behaviors, e.g. Laying in bed awake doing anything but sleeping. This means if you have a bad night - no naps, etc

## 2012-02-27 ENCOUNTER — Telehealth: Payer: Self-pay | Admitting: Internal Medicine

## 2012-02-27 MED ORDER — TAMSULOSIN HCL 0.4 MG PO CAPS: 0.4000 mg | ORAL_CAPSULE | Freq: Every day | ORAL | Status: DC

## 2012-02-27 NOTE — Telephone Encounter (Signed)
Having prostate trouble, running to the restroom every hour, will be traveling to [place]. Request something to be called in to help with this matter. [facility] [address].

## 2012-02-27 NOTE — Telephone Encounter (Signed)
Returned call to pt and advised per MD. Patient denies any other symptoms so rx for Flomax sent to pharmacy via e-script.

## 2012-02-27 NOTE — Telephone Encounter (Signed)
1. May have flomax 0.4 mg once a day for 14 days 2. If having pain with urination, fever, low back pain, low abdominal pain, Nausea or vomiting will consider antibiotics.

## 2012-03-20 ENCOUNTER — Telehealth: Payer: Self-pay | Admitting: Internal Medicine

## 2012-03-20 NOTE — Telephone Encounter (Signed)
Patient calling, has recurrent pain on urination and lower abd pain for a week.  No blood in the urine.  No fever.   Triaged per Urinary Sx.  Scheduled appt for 11a Thursday.  Pt. to push fluids tonight and avoid caffience and tomato products.  If any sx worsens or he cannot urinate he is to go to the ED.

## 2012-03-21 ENCOUNTER — Other Ambulatory Visit (INDEPENDENT_AMBULATORY_CARE_PROVIDER_SITE_OTHER): Payer: Medicare Other

## 2012-03-21 ENCOUNTER — Encounter: Payer: Self-pay | Admitting: Internal Medicine

## 2012-03-21 ENCOUNTER — Ambulatory Visit (INDEPENDENT_AMBULATORY_CARE_PROVIDER_SITE_OTHER): Payer: Medicare Other | Admitting: Internal Medicine

## 2012-03-21 VITALS — BP 112/72 | HR 78 | Temp 98.5°F | Resp 16 | Wt 240.0 lb

## 2012-03-21 DIAGNOSIS — E119 Type 2 diabetes mellitus without complications: Secondary | ICD-10-CM

## 2012-03-21 DIAGNOSIS — N390 Urinary tract infection, site not specified: Secondary | ICD-10-CM

## 2012-03-21 DIAGNOSIS — E785 Hyperlipidemia, unspecified: Secondary | ICD-10-CM

## 2012-03-21 DIAGNOSIS — R6882 Decreased libido: Secondary | ICD-10-CM

## 2012-03-21 DIAGNOSIS — N410 Acute prostatitis: Secondary | ICD-10-CM

## 2012-03-21 LAB — POCT URINALYSIS DIPSTICK
Bilirubin, UA: NEGATIVE
Blood, UA: NEGATIVE
Glucose, UA: NEGATIVE
Ketones, UA: NEGATIVE
Leukocytes, UA: NEGATIVE
Nitrite, UA: NEGATIVE
Protein, UA: NEGATIVE
Spec Grav, UA: 1.02
Urobilinogen, UA: 0.2
pH, UA: 5

## 2012-03-21 LAB — LIPID PANEL
Total CHOL/HDL Ratio: 5
Triglycerides: 172 mg/dL — ABNORMAL HIGH (ref 0.0–149.0)

## 2012-03-21 LAB — HEPATIC FUNCTION PANEL
AST: 19 U/L (ref 0–37)
Albumin: 3.9 g/dL (ref 3.5–5.2)
Alkaline Phosphatase: 65 U/L (ref 39–117)
Total Bilirubin: 0.4 mg/dL (ref 0.3–1.2)

## 2012-03-21 MED ORDER — CIPROFLOXACIN HCL 250 MG PO TABS: 250.0000 mg | ORAL_TABLET | Freq: Two times a day (BID) | ORAL | Status: DC

## 2012-03-21 NOTE — Patient Instructions (Addendum)
Increased frequency, burning, bilateral lower abdominal pain and not feeling on top of it all suggest prostatitis  Plan Cipro 250 mg twice a day for 14 days and maybe a repeat: a sequestered infection in the prostate.  Will check testosterone level to see if this is causing a loss of libido (interest in sex)  Will check on cholesterol and diabetes at the same time.     Prostatitis The prostate gland is about the size and shape of a walnut. It is located just below your bladder. It produces one of the components of semen, which is made up of sperm and the fluids that help nourish and transport it out from the testicles. Prostatitis is redness, soreness, and swelling (inflammation) of the prostate gland.   There are 3 types of prostatitis:  Acute bacterial prostatitis This is the least common type of prostatitis. It starts quickly and usually leads to a bladder infection. It can occur at any age.   Chronic bacterial prostatitis This is a persistent bacterial infection in the prostate.It usually develops from repeated acute bacterial prostatitis or acute bacterial prostatitis that was not properly treated. It can occur in men of any age but is most common in middle-aged men whose prostate has begun to enlarge.   Chronic prostatitis chronic pelvic pain syndrome This is the most common type of prostatitis. It is inflammation of the prostate gland that is not caused by a bacterial infection. The cause is unknown.  CAUSES The cause of acute and chronic bacterial prostatitis is a bacterial infection. The exact cause of chronic prostatitis and chronic pelvic pain syndrome and asymptomatic inflammatory prostatitis is unknown.   SYMPTOMS   Symptoms can vary depending upon the type of prostatitis that exists. There can also be overlap in symptoms. Possible symptoms for each type of prostatitis are listed below. Acute bacterial prostatitis  Painful urination.   Fever or chills.   Muscle or joint  pains.   Low back pain.   Low abdominal pain.   Inability to empty bladder completely.   Sudden urge to urinate.   Frequent urination.   Difficulty starting urine stream.   Weak urine stream.   Discharge from the urethra.   Dribbling after urination.   Rectal pain.   Pain in the testicles, penis, or tip of the penis.   Pain in the space between the anus and scrotum (perineum).   Problems with sexual function.   Painful ejaculation.   Bloody semen.  Chronic bacterial prostatitis  The symptoms are similar to those of acute bacterial prostatitis, but they usually are much less severe. Fever, chills, and muscle and joint pain are not associated with chronic bacterial prostatitis.  Chronic prostatitis chronic pelvic pain syndrome  Symptoms typically include a dull ache in the scrotum and the perineum.  DIAGNOSIS   In order to diagnose prostatitis, your caregiver will ask about your symptoms. If acute or chronic bacterial prostatitis is suspected, a urine sample will be taken and tested (urinalysis). This is to see if there is bacteria in your urine. If the urinalysis result is negative for bacteria, your caregiver may use a finger to feel your prostate (digital rectal exam). This exam helps your caregiver determine if your prostate is swollen and tender. TREATMENT   Treatment for prostatitis depends on the cause. If a bacterial infection is the cause, it can be treated with antibiotic medicine. In cases of chronic bacterial prostatitis, the use of antibiotics for up to 1 month may be necessary.  Your caregiver may instruct you to take sitz baths to help relieve pain. A sitz bath is a bath of hot water in which your hips and buttocks are under water. HOME CARE INSTRUCTIONS    Take all medicines as directed by your caregiver.   Take sitz baths as directed by your caregiver.  SEEK MEDICAL CARE IF:    Your symptoms get worse, not better.   You have a fever.  SEEK IMMEDIATE  MEDICAL CARE IF:    You have chills.   You feel nauseous or vomit.   You feel lightheaded or faint.   You are unable to urinate.   You have blood or blood clots in your urine.  Document Released: 04/28/2000 Document Revised: 07/24/2011 Document Reviewed: 04/03/2011 [facility] Patient Information 2013 [place].

## 2012-03-24 NOTE — Assessment & Plan Note (Signed)
For lipid panel today with recommendations to follow.   Addendum - LDL is better than goal of 100 or less. Plan Continue present regimen.

## 2012-03-24 NOTE — Assessment & Plan Note (Signed)
Due for A1C with recommendations to follow.  Addendum: Lab Results  Component Value Date   HGBA1C 6.3 03/21/2012   Good control. Continue present medications.

## 2012-03-24 NOTE — Progress Notes (Signed)
Subjective:    Patient ID: Antonio Adams, male    DOB: 11-14-1945, 66 y.o.   MRN: [id number]  HPI Mr. Clemon presents c/o increased urinary frequency, urgency along with bilateral lower quadrant abdominal pain and some discomfort with ejactulation. He already takes tamsulosin and Toviaz. These changes have been of recent onset and are similar to previous bouts of prostate infection. He denies any fever, rigors or diffuse myalgias.  Past Medical History  Diagnosis Date  . DIABETES MELLITUS, TYPE II 03/16/2007  . HYPERLIPIDEMIA 03/16/2007  . CARPAL TUNNEL SYNDROME, BILATERAL 03/16/2007  . HYPERTENSION 03/16/2007  . ALLERGIC RHINITIS 03/16/2007  . GERD 03/16/2007  . BACK PAIN WITH RADICULOPATHY 11/23/2008  . LEG CRAMPS 05/22/2007  . PARESTHESIA 05/21/2007  . GASTROINTESTINAL HEMORRHAGE, HX OF 03/16/2007  . Overactive bladder   . Hemorrhoids   . Sleep apnea     CPAP USED FOR 2 YRS   . ADENOCARCINOMA, PROSTATE, GLEASON GRADE 6 12/21/2009   Past Surgical History  Procedure Date  . Back surgery 1983, 2008    repeat surgery; ESI '08  . Shoulder surgery LFT  . Carpal tunnel release     bilateral  . Stress cardiolite 12/24/2003    negative for ischemia  . Wound exploration     management of a wound that he sustained in the [employer]  . Prostate surgery march 2012    seed implant  . Cervical fusion   . Cardiac catheterization 06/28/2010    non-obstructive disease-?NOT SURE THIS WAS DONE  . Lumbar laminectomy 10/30/2011    Procedure: MICRODISCECTOMY LUMBAR LAMINECTOMY;  Surgeon: Jessy Oto, MD;  Location: [place];  Service: Orthopedics;  Laterality: Right;  Right L4-5 and L5-S1 Microdiscectomy   Family History  Problem Relation Age of Onset  . Cancer Sister 30    Cancer  . Hypertension Other   . Colon cancer Neg Hx   . Esophageal cancer Neg Hx   . Stomach cancer Neg Hx    History   Social History  . Marital Status: Married    Spouse Name: N/A    Number of Children: 2  . Years  of Education: 12+   Occupational History  . Event organiser; Curator     retired from Event organiser   Social History Main Topics  . Smoking status: Never Smoker   . Smokeless tobacco: Never Used  . Alcohol Use: No  . Drug Use: No  . Sexually Active: Yes -- Male partner(s)   Other Topics Concern  . Not on file   Social History Narrative   Married-divorced '89, remarried '96. 2 sons, 1 daughter. 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 He wishes to be a full resuscitation candidate. Daily caffeine     Current Outpatient Prescriptions on File Prior to Visit  Medication Sig Dispense Refill  . aspirin 325 MG tablet Take 325 mg by mouth daily.        . Cholecalciferol 1000 UNITS capsule Take 1,000 Units by mouth daily.       . ciprofloxacin (CIPRO) 500 MG tablet Take 1 tablet (500 mg total) by mouth 2 (two) times daily.  20 tablet  0  . CRESTOR 20 MG tablet TAKE 1 TABLET AT BEDTIME  30 tablet  10  . diltiazem (DILACOR XR) 120 MG 24 hr capsule Take 120 mg by mouth daily.        . fesoterodine (TOVIAZ) 4 MG TB24 Take 4 mg  by mouth daily.      Marland Kitchen gabapentin (NEURONTIN) 100 MG capsule Take 100 mg by mouth at bedtime.      Marland Kitchen gemfibrozil (LOPID) 600 MG tablet Take 600 mg by mouth 2 (two) times daily.       Marland Kitchen glucose blood (ONE TOUCH ULTRA TEST) test strip 1 each by Other route 2 (two) times daily. And lancets 2/day 250.03  100 each  12  . JANUVIA 100 MG tablet TAKE 1 TABLET BY MOUTH EVERY DAY  30 tablet  5  . lisinopril (PRINIVIL,ZESTRIL) 40 MG tablet Take 20 mg by mouth daily.       . metFORMIN (GLUCOPHAGE) 1000 MG tablet Take 1 tablet (1,000 mg total) by mouth 2 (two) times daily with a meal.  60 tablet  1  . Multiple Vitamin (MULTIVITAMIN) tablet Take 1 tablet by mouth daily.        . OMEGA 3 1200 MG CAPS Take 1,200 mg by mouth daily.      Marland Kitchen omeprazole (PRILOSEC) 40 MG capsule Take 1 capsule (40 mg total) by mouth daily.  30  capsule  11  . sennosides-docusate sodium (SENOKOT-S) 8.6-50 MG tablet Take 1 tablet by mouth daily.  30 tablet  1  . Tamsulosin HCl (FLOMAX) 0.4 MG CAPS Take 1 capsule (0.4 mg total) by mouth daily. X 14 days only  14 capsule  0  . zolpidem (AMBIEN) 5 MG tablet Take 1 tablet (5 mg total) by mouth at bedtime as needed for sleep.  30 tablet  3      Review of Systems System review is negative for any constitutional, cardiac, pulmonary, GI or neuro symptoms or complaints other than as described in the HPI.     Objective:   Physical Exam Filed Vitals:   03/21/12 1119  BP: 112/72  Pulse: 78  Temp: 98.5 F (36.9 C)  Resp: 16   Gen'l- WNWD AA man in no acute distress Cor- RRR Pulm - normal respirations Neuro - A&O x 3.      Assessment & Plan:  1. Acute prostatitis - Increased frequency, burning, bilateral lower abdominal pain and not feeling on top of it all suggest prostatitis  Plan Cipro 250 mg twice a day for 14 days and maybe a repeat: a sequestered infection in the prostate.   2. Decreased Libido - he wants to know if this is hormonal.  Plan - Testosterone level, however with a history of prostate cancer he is not a candidate for replace unless cleared by his urologist.   Addendum: testosterone 250 (nl 300-911)

## 2012-05-29 ENCOUNTER — Other Ambulatory Visit: Payer: Self-pay | Admitting: Specialist

## 2012-05-29 DIAGNOSIS — M545 Low back pain: Secondary | ICD-10-CM

## 2012-06-03 ENCOUNTER — Other Ambulatory Visit: Payer: Self-pay | Admitting: Specialist

## 2012-06-03 ENCOUNTER — Ambulatory Visit
Admission: RE | Admit: 2012-06-03 | Discharge: 2012-06-03 | Disposition: A | Discharge status: Home / Self Care | Payer: Medicare Other | Source: Ambulatory Visit | Attending: Specialist | Admitting: Specialist

## 2012-06-03 DIAGNOSIS — M545 Low back pain: Secondary | ICD-10-CM

## 2012-06-04 ENCOUNTER — Ambulatory Visit (INDEPENDENT_AMBULATORY_CARE_PROVIDER_SITE_OTHER): Payer: Medicare Other | Admitting: Internal Medicine

## 2012-06-04 ENCOUNTER — Encounter: Payer: Self-pay | Admitting: Internal Medicine

## 2012-06-04 VITALS — BP 118/70 | HR 90 | Temp 99.0°F | Resp 12 | Wt 238.1 lb

## 2012-06-04 DIAGNOSIS — K6289 Other specified diseases of anus and rectum: Secondary | ICD-10-CM

## 2012-06-04 DIAGNOSIS — K0510 Chronic gingivitis, plaque induced: Secondary | ICD-10-CM | POA: Insufficient documentation

## 2012-06-04 DIAGNOSIS — M5126 Other intervertebral disc displacement, lumbar region: Secondary | ICD-10-CM

## 2012-06-04 DIAGNOSIS — K627 Radiation proctitis: Secondary | ICD-10-CM | POA: Insufficient documentation

## 2012-06-04 NOTE — Patient Instructions (Addendum)
1. Rectal bleeding - reviewed colonoscopy report from June '13: a normal study except for radiation proctitis - severe inflammation at the rectum and end of the colon which is the source of bleeding. This is a chronic type of problem that will come and go.  Plan For several days of bleeding will need to check a hemoglobin.  If there is a drop of 2 g or more from your baseline of 11.0 g will try Sucralfate 2g enema daily for 10 days as a possible treatment  2. Low back pain -  MRI Lumbar spine Jan 14th: IMPRESSION:   Lumbar spondylosis and degenerative disc disease cause severe  impingement at L5-S1, moderate impingement L4-5, and mild  impingement at L3-4 as discussed above. With regard to the L5-S1  level, there is been considerable enlargement in the recurrent  right lateral recess disc extrusion which currently measures up to  1.6 cm in diameter.  Recurrent disk disease with the main actor being the fragment of disk at L5-S1.  Plan Per Dr. Louanne Skye

## 2012-06-05 NOTE — Progress Notes (Signed)
Subjective:    Patient ID: Antonio Adams, male    DOB: [birth year], 67 y.o.   MRN: [id number]  HPI [name] presents due to hematochezia. He reports several episodes over the last week, always associated with BMs. He denies any pain. He has not had any lighted headed episodes. Chart reviewed - at last colonoscopy June '13 he had a normal exam to the cecum except for changes of radiation proctitis. Reviewed the report and images with the patient.  He reports he is having increased pain in the right leg and recently had an MRI. Reviewed the report and images with him: multiple changes with surgical changes as well. He has a large disc fragment L5-S1 that has increase from 0.7cm to 1.6 cm with foraminal encroachment right.   Past Medical History  Diagnosis Date  . DIABETES MELLITUS, TYPE II 03/16/2007  . HYPERLIPIDEMIA 03/16/2007  . CARPAL TUNNEL SYNDROME, BILATERAL 03/16/2007  . HYPERTENSION 03/16/2007  . ALLERGIC RHINITIS 03/16/2007  . GERD 03/16/2007  . BACK PAIN WITH RADICULOPATHY 11/23/2008  . LEG CRAMPS 05/22/2007  . PARESTHESIA 05/21/2007  . GASTROINTESTINAL HEMORRHAGE, HX OF 03/16/2007  . Overactive bladder   . Hemorrhoids   . Sleep apnea     CPAP USED FOR 2 YRS   . ADENOCARCINOMA, PROSTATE, GLEASON GRADE 6 12/21/2009   Past Surgical History  Procedure Date  . Back surgery 1983, 2008    repeat surgery; ESI '08  . Shoulder surgery LFT  . Carpal tunnel release     bilateral  . Stress cardiolite 12/24/2003    negative for ischemia  . Wound exploration     management of a wound that he sustained in the [employer]  . Prostate surgery march 2012    seed implant  . Cervical fusion   . Cardiac catheterization 06/28/2010    non-obstructive disease-?NOT SURE THIS WAS DONE  . Lumbar laminectomy 10/30/2011    Procedure: MICRODISCECTOMY LUMBAR LAMINECTOMY;  Surgeon: Jessy Oto, MD;  Location: [place];  Service: Orthopedics;  Laterality: Right;  Right L4-5 and L5-S1 Microdiscectomy    Family History  Problem Relation Age of Onset  . Cancer Sister 30    Cancer  . Hypertension Other   . Colon cancer Neg Hx   . Esophageal cancer Neg Hx   . Stomach cancer Neg Hx    History   Social History  . Marital Status: Married    Spouse Name: N/A    Number of Children: 2  . Years of Education: 12+   Occupational History  . Event organiser; Curator     retired from Event organiser   Social History Main Topics  . Smoking status: Never Smoker   . Smokeless tobacco: Never Used  . Alcohol Use: No  . Drug Use: No  . Sexually Active: Yes -- Male partner(s)   Other Topics Concern  . Not on file   Social History Narrative   Married-divorced '89, remarried '96. 2 sons, 1 daughter. 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 He wishes to be a full resuscitation candidate. Daily caffeine     Current Outpatient Prescriptions on File Prior to Visit  Medication Sig Dispense Refill  . aspirin 325 MG tablet Take 325 mg by mouth daily.        . Cholecalciferol 1000 UNITS capsule Take 1,000 Units by mouth daily.       . ciprofloxacin (CIPRO) 250 MG tablet Take 1 tablet (250  mg total) by mouth 2 (two) times daily.  28 tablet  1  . ciprofloxacin (CIPRO) 500 MG tablet Take 1 tablet (500 mg total) by mouth 2 (two) times daily.  20 tablet  0  . CRESTOR 20 MG tablet TAKE 1 TABLET AT BEDTIME  30 tablet  10  . diltiazem (DILACOR XR) 120 MG 24 hr capsule Take 120 mg by mouth daily.        . fesoterodine (TOVIAZ) 4 MG TB24 Take 4 mg by mouth daily.      Marland Kitchen gabapentin (NEURONTIN) 100 MG capsule Take 100 mg by mouth at bedtime.      Marland Kitchen gemfibrozil (LOPID) 600 MG tablet Take 600 mg by mouth 2 (two) times daily.       Marland Kitchen glucose blood (ONE TOUCH ULTRA TEST) test strip 1 each by Other route 2 (two) times daily. And lancets 2/day 250.03  100 each  12  . JANUVIA 100 MG tablet TAKE 1 TABLET BY MOUTH EVERY DAY  30 tablet  5  . lisinopril  (PRINIVIL,ZESTRIL) 40 MG tablet Take 20 mg by mouth daily.       . metFORMIN (GLUCOPHAGE) 1000 MG tablet Take 1 tablet (1,000 mg total) by mouth 2 (two) times daily with a meal.  60 tablet  1  . Multiple Vitamin (MULTIVITAMIN) tablet Take 1 tablet by mouth daily.        . OMEGA 3 1200 MG CAPS Take 1,200 mg by mouth daily.      Marland Kitchen omeprazole (PRILOSEC) 40 MG capsule Take 1 capsule (40 mg total) by mouth daily.  30 capsule  11  . sennosides-docusate sodium (SENOKOT-S) 8.6-50 MG tablet Take 1 tablet by mouth daily.  30 tablet  1  . Tamsulosin HCl (FLOMAX) 0.4 MG CAPS Take 1 capsule (0.4 mg total) by mouth daily. X 14 days only  14 capsule  0  . zolpidem (AMBIEN) 5 MG tablet Take 1 tablet (5 mg total) by mouth at bedtime as needed for sleep.  30 tablet  3      Review of Systems System review is negative for any constitutional, cardiac, pulmonary, GI or neuro symptoms or complaints other than as described in the HPI.     Objective:   Physical Exam Filed Vitals:   06/04/12 1128  BP: 118/70  Pulse: 90  Temp: 99 F (37.2 C)  Resp: 12   Gen'l - very dapper AA man in no distress HEENT- C&S w/o pallor Cor - RRR Neuro - has a gait abnormality favoring the right leg.        Assessment & Plan:

## 2012-06-05 NOTE — Assessment & Plan Note (Signed)
Rectal bleeding - reviewed colonoscopy report from June '13: a normal study except for radiation proctitis - severe inflammation at the rectum and end of the colon which is the source of bleeding. This is a chronic type of problem that will come and go. Researched treatment options in UpToDate and PubMed  Plan For several days of bleeding will need to check a hemoglobin.  If there is a drop of 2 g or more from your baseline of 11.0 g will try Sucralfate 2g enema daily for 10 days as a possible treatment

## 2012-06-05 NOTE — Assessment & Plan Note (Signed)
Patient with increased pain right distal LE.   Plan - he is followed by Dr. Louanne Skye and will be having redo surgery - diskectomy

## 2012-06-11 ENCOUNTER — Other Ambulatory Visit (HOSPITAL_COMMUNITY): Payer: Self-pay | Admitting: Specialist

## 2012-06-13 ENCOUNTER — Encounter (HOSPITAL_COMMUNITY)
Admission: RE | Admit: 2012-06-13 | Discharge: 2012-06-13 | Payer: Medicare Other | Source: Ambulatory Visit | Attending: Specialist | Admitting: Specialist

## 2012-06-13 ENCOUNTER — Encounter (HOSPITAL_COMMUNITY): Payer: Self-pay

## 2012-06-13 HISTORY — DX: Nocturia: R35.1

## 2012-06-13 HISTORY — DX: Unspecified osteoarthritis, unspecified site: M19.90

## 2012-06-13 HISTORY — DX: Major depressive disorder, single episode, unspecified: F32.9

## 2012-06-13 HISTORY — DX: Personal history of colonic polyps: Z86.010

## 2012-06-13 HISTORY — DX: Other chronic pain: G89.29

## 2012-06-13 HISTORY — DX: Dorsalgia, unspecified: M54.9

## 2012-06-13 HISTORY — DX: Constipation, unspecified: K59.00

## 2012-06-13 HISTORY — DX: Unspecified glaucoma: H40.9

## 2012-06-13 HISTORY — DX: Frequency of micturition: R35.0

## 2012-06-13 HISTORY — DX: Depression, unspecified: F32.A

## 2012-06-13 HISTORY — DX: Personal history of colon polyps, unspecified: Z86.0100

## 2012-06-13 HISTORY — DX: Hemorrhage of anus and rectum: K62.5

## 2012-06-13 LAB — COMPREHENSIVE METABOLIC PANEL
ALT: 14 U/L (ref 0–53)
Alkaline Phosphatase: 68 U/L (ref 39–117)
CO2: 22 mEq/L (ref 19–32)
GFR calc Af Amer: 64 mL/min — ABNORMAL LOW (ref >90)
GFR calc non Af Amer: 56 mL/min — ABNORMAL LOW (ref >90)
Glucose, Bld: 201 mg/dL — ABNORMAL HIGH (ref 70–99)
Potassium: 4.6 mEq/L (ref 3.5–5.1)
Sodium: 133 mEq/L — ABNORMAL LOW (ref 135–145)

## 2012-06-13 LAB — CBC
HCT: 37.1 % — ABNORMAL LOW (ref 39.0–52.0)
Hemoglobin: 12.5 g/dL — ABNORMAL LOW (ref 13.0–17.0)
RBC: 4.55 MIL/uL (ref 4.22–5.81)
WBC: 3.8 10*3/uL — ABNORMAL LOW (ref 4.0–10.5)

## 2012-06-13 LAB — URINALYSIS, ROUTINE W REFLEX MICROSCOPIC
Glucose, UA: NEGATIVE mg/dL
Hgb urine dipstick: NEGATIVE
Specific Gravity, Urine: 1.03 (ref 1.005–1.030)
pH: 5.5 (ref 5.0–8.0)

## 2012-06-13 NOTE — Progress Notes (Signed)
Left a message for Antonio Adams regarding orders needing to be signed

## 2012-06-13 NOTE — Progress Notes (Signed)
Pt doesn't have a cardiologist  Stress test done >39yrs ago Denies ever having an echo or heart cath  Dr.Michael Norins is medical MD  EKG in epic from 10/12/11 CXR in epic 10/2011

## 2012-06-13 NOTE — Progress Notes (Signed)
Sleep study done at [place] in [place] to request report

## 2012-06-13 NOTE — Pre-Procedure Instructions (Signed)
BOYSIE LATINA  06/13/2012   Your procedure is scheduled on:  Mon, Feb 3 @ 7:30 AM  Report to [facility] at 5:30 AM.  Call this number if you have problems the morning of surgery: [phone]   Remember:   Do not eat food or drink liquids after midnight.   Take these medicines the morning of surgery with A SIP OF WATER: Diltiazem(Dilacor),Fesoterodine(Toviaz),Tamsulosin(Flomax),and Omeprazole(Prilosec)   Do not wear jewelry  Do not wear lotions, powders, or colognes. You may wear deodorant.  Men may shave face and neck.  Do not bring valuables to the hospital.  Contacts, dentures or bridgework may not be worn into surgery.  Leave suitcase in the car. After surgery it may be brought to your room.  For patients admitted to the hospital, checkout time is 11:00 AM the day of  discharge.   Patients discharged the day of surgery will not be allowed to drive  home.    Special Instructions: Shower using CHG 2 nights before surgery and the night before surgery.  If you shower the day of surgery use CHG.  Use special wash - you have one bottle of CHG for all showers.  You should use approximately 1/3 of the bottle for each shower.   Please read over the following fact sheets that you were given: Pain Booklet, Coughing and Deep Breathing, MRSA Information and Surgical Site Infection Prevention

## 2012-06-14 NOTE — Consult Note (Signed)
Anesthesia Chart Review:  Patient is a 67 year old male scheduled for right L5-S1 microdiskectomy by Dr. Louanne Skye on 06/17/12.  See my note from 10/27/11.  Preoperative labs and previous CXR/EKG noted.  His renal function appears at baseline.  He will get a CBG on arrival. If no acute changes then anticipate he can proceed as planned.  Myra Gianotti, PA-C 06/14/12 1013

## 2012-06-14 NOTE — H&P (Signed)
Antonio Adams is an 67 y.o. male.    CHIEF COMPLAINT:  back pain, radiation right leg, severe, with weakness in the right leg, right leg limp.   HISTORY OF PRESENT ILLNESS:   He is using a cane on the right side, having pain when he bends, stoops, sits for long periods of time.  Radiation into the right posterolateral aspect of the leg into the lateral right foot and little toe and into the bottom of the foot.  He did well following the surgery of June this past year.  He had right-sided 2-level microdiskectomy at L4-5, L5-S1. Leg pain and back pain improved for a time but right leg pain returned with associated weakness, numbness and tingling.  Relief was not achieved with pain meds, or rest.  MRI was done and shows a rather large disk herniation on the right side, L5-S1, that fills in the lateral recess and the lateral 40% of the spinal canal.  This is a very large disk rupture and explains his weakness.  It explains his recurrent pain and explains his pain is worse than it was previously.  His disk herniation that was treated prior to this was about 1/3 of the size of this rupture and it is understandable that he may be having more problems with this particular herniation than his last. ASSESSMENT:  Recurrent HNP, right L5-S1.  L4-5 shows small central protrusion without a great deal of nerve root compression. Considering his large disk herniation with a large amount of mass effect with extrusion of material within the canal, resection is probably the best way to deal with this in hopes of regaining function. Patient to undergo a re-do microdiskectomy, right side, L5-S1, using MIS apprach.  The risks of surgery, including infection, bleeding, neurologic compromise discussed with [name].    Past Medical History  Diagnosis Date  . HYPERLIPIDEMIA 03/16/2007    takes Crestor daily  . CARPAL TUNNEL SYNDROME, BILATERAL 03/16/2007  . ALLERGIC RHINITIS 03/16/2007  . BACK PAIN WITH RADICULOPATHY  11/23/2008  . LEG CRAMPS 05/22/2007  . PARESTHESIA 05/21/2007  . GASTROINTESTINAL HEMORRHAGE, HX OF 03/16/2007  . Overactive bladder   . Hemorrhoids   . ADENOCARCINOMA, PROSTATE, GLEASON GRADE 6 12/21/2009  . HYPERTENSION 03/16/2007    takes Diltiazem and Lisinopril daily   . Sleep apnea   . Chronic back pain   . Arthritis     back   . GERD 03/16/2007    takes Omeprazole daily  . Rectal bleeding     Dr.Norins has explained its from the Radiation that he has received  . History of colon polyps   . Urinary frequency     takes Toviaz daily  . Nocturia   . DIABETES MELLITUS, TYPE II 03/16/2007    takes Januvia and MEtformin daily  . Constipation     takes Senokot daily  . Glaucoma     mild  . Depression     occasionally    Past Surgical History  Procedure Date  . Back surgery 1983, 2008    repeat surgery; ESI '08  . Shoulder surgery LFT  . Carpal tunnel release     bilateral  . Stress cardiolite 12/24/2003    negative for ischemia  . Wound exploration     management of a wound that he sustained in the [employer]  . Prostate surgery march 2012    seed implant  . Cervical fusion   . Lumbar laminectomy 10/30/2011    Procedure: MICRODISCECTOMY LUMBAR LAMINECTOMY;  Surgeon: Jessy Oto, MD;  Location: [facility];  Service: Orthopedics;  Laterality: Right;  Right L4-5 and L5-S1 Microdiscectomy  . Colonosocpy   . Esophagogastroduodenoscopy     ED    Family History  Problem Relation Age of Onset  . Cancer Sister 30    Cancer  . Hypertension Other   . Colon cancer Neg Hx   . Esophageal cancer Neg Hx   . Stomach cancer Neg Hx    Social History:  reports that he has never smoked. He has never used smokeless tobacco. He reports that he does not drink alcohol or use illicit drugs.  Allergies: No Known Allergies  Medications Prior to Admission  Medication Sig Dispense Refill  . aspirin 325 MG tablet Take 325 mg by mouth daily.        . Cholecalciferol 1000 UNITS capsule Take 1,000  Units by mouth daily.       . ciprofloxacin (CIPRO) 250 MG tablet Take 250 mg by mouth 2 (two) times daily.      Marland Kitchen diltiazem (DILACOR XR) 120 MG 24 hr capsule Take 120 mg by mouth daily.        . fesoterodine (TOVIAZ) 4 MG TB24 Take 4 mg by mouth daily.      Marland Kitchen gabapentin (NEURONTIN) 100 MG capsule Take 100 mg by mouth at bedtime.      Marland Kitchen gemfibrozil (LOPID) 600 MG tablet Take 600 mg by mouth 2 (two) times daily.       Marland Kitchen glucose blood (ONE TOUCH ULTRA TEST) test strip 1 each by Other route 2 (two) times daily. And lancets 2/day 250.03  100 each  12  . lisinopril (PRINIVIL,ZESTRIL) 40 MG tablet Take 20 mg by mouth daily.       . metFORMIN (GLUCOPHAGE) 1000 MG tablet Take 1,000 mg by mouth 2 (two) times daily with a meal.      . Multiple Vitamin (MULTIVITAMIN) tablet Take 1 tablet by mouth daily.        . OMEGA 3 1200 MG CAPS Take 1,200 mg by mouth daily.      Marland Kitchen omeprazole (PRILOSEC) 40 MG capsule Take 40 mg by mouth daily.      . rosuvastatin (CRESTOR) 20 MG tablet Take 20 mg by mouth at bedtime.      . sitaGLIPtin (JANUVIA) 100 MG tablet Take 100 mg by mouth daily.      . metFORMIN (GLUCOPHAGE) 1000 MG tablet Take 1 tablet (1,000 mg total) by mouth 2 (two) times daily with a meal.  60 tablet  1  . senna-docusate (SENOKOT-S) 8.6-50 MG per tablet Take 1 tablet by mouth daily.        Results for orders placed during the hospital encounter of 06/17/12 (from the past 48 hour(s))  CBC WITH DIFFERENTIAL     Status: Abnormal   Collection Time   06/17/12  6:01 AM      Component Value Range Comment   WBC 4.8  4.0 - 10.5 K/uL    RBC 4.68  4.22 - 5.81 MIL/uL    Hemoglobin 13.0  13.0 - 17.0 g/dL    HCT 38.1 (*) 39.0 - 52.0 %    MCV 81.4  78.0 - 100.0 fL    MCH 27.8  26.0 - 34.0 pg    MCHC 34.1  30.0 - 36.0 g/dL    RDW 15.4  11.5 - 15.5 %    Platelets 246  150 - 400 K/uL    Neutrophils Relative 61  43 - 77 %    Neutro Abs 2.9  1.7 - 7.7 K/uL    Lymphocytes Relative 25  12 - 46 %    Lymphs Abs 1.2   0.7 - 4.0 K/uL    Monocytes Relative 13 (*) 3 - 12 %    Monocytes Absolute 0.6  0.1 - 1.0 K/uL    Eosinophils Relative 2  0 - 5 %    Eosinophils Absolute 0.1  0.0 - 0.7 K/uL    Basophils Relative 0  0 - 1 %    Basophils Absolute 0.0  0.0 - 0.1 K/uL   GLUCOSE, CAPILLARY     Status: Normal   Collection Time   06/17/12  6:37 AM      Component Value Range Comment   Glucose-Capillary 78  70 - 99 mg/dL    No results found.  Review of Systems  Musculoskeletal:       Right leg numbness and weakness  All other systems reviewed and are negative.    Blood pressure 122/80, pulse 85, temperature 98.5 F (36.9 C), temperature source Oral, resp. rate 18, SpO2 96.00%. Physical Exam  Constitutional: He is oriented to person, place, and time. He appears well-developed and well-nourished.  HENT:  Head: Normocephalic and atraumatic.  Eyes: EOM are normal. Pupils are equal, round, and reactive to light.  Neck: Normal range of motion. Neck supple.  Cardiovascular: Normal rate and regular rhythm.   Respiratory: Effort normal and breath sounds normal.  GI: Soft.  Musculoskeletal:         positive sciatic tension signs on the right side.  Straight leg raise in the dangling position positive at about 4 degrees short of full extension.  He has bowstring sign, weakness in right foot dorsiflexion that is, at this point, 4/5.  EHL strength decreased on the right.  Plantar flexion strength is good.    Neurological: He is alert and oriented to person, place, and time.  Skin: Skin is warm and dry.  Psychiatric: He has a normal mood and affect.     Assessment/Plan Recurrent HNP right L5-S1  Microdiscectomy right L5-S1  Daeshawn Redmann E 06/17/2012, 7:34 AM

## 2012-06-16 MED ORDER — CEFAZOLIN SODIUM-DEXTROSE 2-3 GM-% IV SOLR
2.0000 g | INTRAVENOUS | Status: AC
  Administered 2012-06-17: 2 g via INTRAVENOUS
  Filled 2012-06-16: qty 50

## 2012-06-17 ENCOUNTER — Ambulatory Visit (HOSPITAL_COMMUNITY): Payer: Medicare Other

## 2012-06-17 ENCOUNTER — Encounter (HOSPITAL_COMMUNITY)
Admission: RE | Disposition: A | Discharge status: Home / Self Care | Payer: Self-pay | Source: Ambulatory Visit | Attending: Specialist

## 2012-06-17 ENCOUNTER — Ambulatory Visit (HOSPITAL_COMMUNITY): Payer: Medicare Other | Admitting: Vascular Surgery

## 2012-06-17 ENCOUNTER — Encounter (HOSPITAL_COMMUNITY): Payer: Self-pay | Admitting: Surgery

## 2012-06-17 ENCOUNTER — Encounter (HOSPITAL_COMMUNITY): Payer: Self-pay | Admitting: Vascular Surgery

## 2012-06-17 ENCOUNTER — Observation Stay (HOSPITAL_COMMUNITY)
Admission: RE | Admit: 2012-06-17 | Discharge: 2012-06-18 | Disposition: A | Discharge status: Home / Self Care | Payer: Medicare Other | Source: Ambulatory Visit | Attending: Specialist | Admitting: Specialist

## 2012-06-17 ENCOUNTER — Encounter (HOSPITAL_COMMUNITY): Payer: Self-pay | Admitting: Specialist

## 2012-06-17 DIAGNOSIS — K219 Gastro-esophageal reflux disease without esophagitis: Secondary | ICD-10-CM | POA: Insufficient documentation

## 2012-06-17 DIAGNOSIS — I10 Essential (primary) hypertension: Secondary | ICD-10-CM | POA: Insufficient documentation

## 2012-06-17 DIAGNOSIS — E119 Type 2 diabetes mellitus without complications: Secondary | ICD-10-CM | POA: Insufficient documentation

## 2012-06-17 DIAGNOSIS — E785 Hyperlipidemia, unspecified: Secondary | ICD-10-CM | POA: Insufficient documentation

## 2012-06-17 DIAGNOSIS — M5126 Other intervertebral disc displacement, lumbar region: Principal | ICD-10-CM | POA: Insufficient documentation

## 2012-06-17 DIAGNOSIS — Z01812 Encounter for preprocedural laboratory examination: Secondary | ICD-10-CM | POA: Insufficient documentation

## 2012-06-17 DIAGNOSIS — G4730 Sleep apnea, unspecified: Secondary | ICD-10-CM | POA: Insufficient documentation

## 2012-06-17 HISTORY — PX: LUMBAR LAMINECTOMY: SHX95

## 2012-06-17 LAB — GLUCOSE, CAPILLARY
Glucose-Capillary: 93 mg/dL (ref 70–99)
Glucose-Capillary: 104 mg/dL — ABNORMAL HIGH (ref 70–99)

## 2012-06-17 LAB — CBC WITH DIFFERENTIAL/PLATELET
Eosinophils Absolute: 0.1 10*3/uL (ref 0.0–0.7)
Eosinophils Relative: 2 % (ref 0–5)
HCT: 38.1 % — ABNORMAL LOW (ref 39.0–52.0)
Lymphs Abs: 1.2 10*3/uL (ref 0.7–4.0)
MCH: 27.8 pg (ref 26.0–34.0)
MCV: 81.4 fL (ref 78.0–100.0)
Monocytes Absolute: 0.6 10*3/uL (ref 0.1–1.0)
Platelets: 246 10*3/uL (ref 150–400)
RBC: 4.68 MIL/uL (ref 4.22–5.81)
RDW: 15.4 % (ref 11.5–15.5)

## 2012-06-17 SURGERY — MICRODISCECTOMY LUMBAR LAMINECTOMY
Anesthesia: General | Site: Back | Wound class: Clean

## 2012-06-17 MED ORDER — DILTIAZEM HCL ER 120 MG PO CP24
120.0000 mg | ORAL_CAPSULE | Freq: Every day | ORAL | Status: DC
Start: 2012-06-17 — End: 2012-06-18
  Administered 2012-06-17: 120 mg via ORAL
  Filled 2012-06-17 (×2): qty 1

## 2012-06-17 MED ORDER — PHENYLEPHRINE HCL 10 MG/ML IJ SOLN
10.0000 mg | INTRAVENOUS | Status: DC | PRN
  Administered 2012-06-17: 25 ug/min via INTRAVENOUS

## 2012-06-17 MED ORDER — HYDROMORPHONE HCL PF 1 MG/ML IJ SOLN: 0.2500 mg | INTRAMUSCULAR | Status: DC | PRN

## 2012-06-17 MED ORDER — EPHEDRINE SULFATE 50 MG/ML IJ SOLN
INTRAMUSCULAR | Status: DC | PRN
  Administered 2012-06-17 (×2): 5 mg via INTRAVENOUS

## 2012-06-17 MED ORDER — METHOCARBAMOL 500 MG PO TABS
500.0000 mg | ORAL_TABLET | Freq: Four times a day (QID) | ORAL | Status: DC | PRN
Start: 2012-06-17 — End: 2012-06-18

## 2012-06-17 MED ORDER — LIDOCAINE HCL 4 % MT SOLN
OROMUCOSAL | Status: DC | PRN
  Administered 2012-06-17: 4 mL via TOPICAL

## 2012-06-17 MED ORDER — FESOTERODINE FUMARATE ER 4 MG PO TB24
4.0000 mg | ORAL_TABLET | Freq: Every day | ORAL | Status: DC
  Administered 2012-06-17 – 2012-06-18 (×2): 4 mg via ORAL
  Filled 2012-06-17 (×2): qty 1

## 2012-06-17 MED ORDER — CIPROFLOXACIN HCL 250 MG PO TABS
250.0000 mg | ORAL_TABLET | Freq: Two times a day (BID) | ORAL | Status: DC
  Administered 2012-06-17 – 2012-06-18 (×3): 250 mg via ORAL
  Filled 2012-06-17 (×4): qty 1

## 2012-06-17 MED ORDER — SENNOSIDES-DOCUSATE SODIUM 8.6-50 MG PO TABS
1.0000 | ORAL_TABLET | Freq: Every evening | ORAL | Status: DC | PRN
  Filled 2012-06-17: qty 1

## 2012-06-17 MED ORDER — METFORMIN HCL 500 MG PO TABS
1000.0000 mg | ORAL_TABLET | Freq: Two times a day (BID) | ORAL | Status: DC
Start: 2012-06-17 — End: 2012-06-18
  Administered 2012-06-17 – 2012-06-18 (×2): 1000 mg via ORAL
  Filled 2012-06-17 (×4): qty 2

## 2012-06-17 MED ORDER — ARTIFICIAL TEARS OP OINT
TOPICAL_OINTMENT | OPHTHALMIC | Status: DC | PRN
  Administered 2012-06-17: 1 via OPHTHALMIC

## 2012-06-17 MED ORDER — THROMBIN 20000 UNITS EX SOLR
OROMUCOSAL | Status: DC | PRN
  Administered 2012-06-17: 09:00:00 via TOPICAL

## 2012-06-17 MED ORDER — FENTANYL CITRATE 0.05 MG/ML IJ SOLN
INTRAMUSCULAR | Status: DC | PRN
  Administered 2012-06-17: 50 ug via INTRAVENOUS
  Administered 2012-06-17: 100 ug via INTRAVENOUS
  Administered 2012-06-17 (×7): 50 ug via INTRAVENOUS

## 2012-06-17 MED ORDER — BISACODYL 5 MG PO TBEC: 5.0000 mg | DELAYED_RELEASE_TABLET | Freq: Every day | ORAL | Status: DC | PRN

## 2012-06-17 MED ORDER — GLUCOSE BLOOD VI STRP: 1.0000 | ORAL_STRIP | Freq: Two times a day (BID) | Status: DC

## 2012-06-17 MED ORDER — NEOSTIGMINE METHYLSULFATE 1 MG/ML IJ SOLN
INTRAMUSCULAR | Status: DC | PRN
  Administered 2012-06-17: 4 mg via INTRAVENOUS

## 2012-06-17 MED ORDER — POTASSIUM CHLORIDE IN NACL 20-0.9 MEQ/L-% IV SOLN
INTRAVENOUS | Status: DC
  Administered 2012-06-17: 17:00:00 via INTRAVENOUS
  Filled 2012-06-17 (×3): qty 1000

## 2012-06-17 MED ORDER — PROMETHAZINE HCL 25 MG/ML IJ SOLN: 6.2500 mg | INTRAMUSCULAR | Status: DC | PRN

## 2012-06-17 MED ORDER — ONDANSETRON HCL 4 MG/2ML IJ SOLN
4.0000 mg | INTRAMUSCULAR | Status: DC | PRN
  Administered 2012-06-18: 4 mg via INTRAVENOUS
  Filled 2012-06-17: qty 2

## 2012-06-17 MED ORDER — OXYCODONE-ACETAMINOPHEN 5-325 MG PO TABS
1.0000 | ORAL_TABLET | ORAL | Status: DC | PRN
  Administered 2012-06-17: 2 via ORAL
  Filled 2012-06-17: qty 2

## 2012-06-17 MED ORDER — SODIUM CHLORIDE 0.9 % IJ SOLN
3.0000 mL | Freq: Two times a day (BID) | INTRAMUSCULAR | Status: DC
  Administered 2012-06-17: 3 mL via INTRAVENOUS

## 2012-06-17 MED ORDER — LISINOPRIL 20 MG PO TABS
20.0000 mg | ORAL_TABLET | Freq: Every day | ORAL | Status: DC
  Administered 2012-06-17: 20 mg via ORAL
  Filled 2012-06-17 (×2): qty 1

## 2012-06-17 MED ORDER — ACETAMINOPHEN 325 MG PO TABS: 650.0000 mg | ORAL_TABLET | ORAL | Status: DC | PRN

## 2012-06-17 MED ORDER — OXYCODONE HCL 5 MG/5ML PO SOLN: 5.0000 mg | Freq: Once | ORAL | Status: DC | PRN

## 2012-06-17 MED ORDER — FLEET ENEMA 7-19 GM/118ML RE ENEM: 1.0000 | ENEMA | Freq: Once | RECTAL | Status: AC | PRN

## 2012-06-17 MED ORDER — MIDAZOLAM HCL 5 MG/5ML IJ SOLN
INTRAMUSCULAR | Status: DC | PRN
  Administered 2012-06-17: 2 mg via INTRAVENOUS

## 2012-06-17 MED ORDER — ESMOLOL HCL 10 MG/ML IV SOLN
INTRAVENOUS | Status: DC | PRN
  Administered 2012-06-17: 20 mg via INTRAVENOUS

## 2012-06-17 MED ORDER — OXYCODONE HCL 5 MG PO TABS: 5.0000 mg | ORAL_TABLET | Freq: Once | ORAL | Status: DC | PRN

## 2012-06-17 MED ORDER — ROCURONIUM BROMIDE 100 MG/10ML IV SOLN
INTRAVENOUS | Status: DC | PRN
  Administered 2012-06-17: 50 mg via INTRAVENOUS
  Administered 2012-06-17 (×2): 10 mg via INTRAVENOUS

## 2012-06-17 MED ORDER — METHOCARBAMOL 100 MG/ML IJ SOLN
500.0000 mg | INTRAVENOUS | Status: DC
  Filled 2012-06-17: qty 5

## 2012-06-17 MED ORDER — THROMBIN 20000 UNITS EX SOLR: CUTANEOUS | Status: DC | PRN

## 2012-06-17 MED ORDER — ONDANSETRON HCL 4 MG/2ML IJ SOLN
INTRAMUSCULAR | Status: DC | PRN
  Administered 2012-06-17: 4 mg via INTRAVENOUS

## 2012-06-17 MED ORDER — HYDROCODONE-ACETAMINOPHEN 5-325 MG PO TABS: 1.0000 | ORAL_TABLET | ORAL | Status: DC | PRN

## 2012-06-17 MED ORDER — ASPIRIN EC 325 MG PO TBEC
325.0000 mg | DELAYED_RELEASE_TABLET | Freq: Every day | ORAL | Status: DC
  Administered 2012-06-17 – 2012-06-18 (×2): 325 mg via ORAL
  Filled 2012-06-17 (×2): qty 1

## 2012-06-17 MED ORDER — DOCUSATE SODIUM 100 MG PO CAPS
100.0000 mg | ORAL_CAPSULE | Freq: Two times a day (BID) | ORAL | Status: DC
  Administered 2012-06-17 – 2012-06-18 (×2): 100 mg via ORAL
  Filled 2012-06-17 (×3): qty 1

## 2012-06-17 MED ORDER — METHOCARBAMOL 100 MG/ML IJ SOLN
500.0000 mg | Freq: Four times a day (QID) | INTRAVENOUS | Status: DC | PRN
  Filled 2012-06-17: qty 5

## 2012-06-17 MED ORDER — CEFAZOLIN SODIUM 1-5 GM-% IV SOLN
1.0000 g | Freq: Three times a day (TID) | INTRAVENOUS | Status: AC
  Administered 2012-06-17 (×2): 1 g via INTRAVENOUS
  Filled 2012-06-17 (×2): qty 50

## 2012-06-17 MED ORDER — PHENYLEPHRINE HCL 10 MG/ML IJ SOLN
INTRAMUSCULAR | Status: DC | PRN
  Administered 2012-06-17 (×3): 80 ug via INTRAVENOUS

## 2012-06-17 MED ORDER — PROPOFOL 10 MG/ML IV BOLUS
INTRAVENOUS | Status: DC | PRN
  Administered 2012-06-17: 100 mg via INTRAVENOUS
  Administered 2012-06-17: 200 mg via INTRAVENOUS

## 2012-06-17 MED ORDER — CHLORHEXIDINE GLUCONATE 4 % EX LIQD: 60.0000 mL | Freq: Once | CUTANEOUS | Status: DC

## 2012-06-17 MED ORDER — GLYCOPYRROLATE 0.2 MG/ML IJ SOLN
INTRAMUSCULAR | Status: DC | PRN
  Administered 2012-06-17: .6 mg via INTRAVENOUS

## 2012-06-17 MED ORDER — METFORMIN HCL 1000 MG PO TABS: 1000.0000 mg | ORAL_TABLET | Freq: Two times a day (BID) | ORAL | Status: DC

## 2012-06-17 MED ORDER — SODIUM CHLORIDE 0.9 % IJ SOLN: 3.0000 mL | INTRAMUSCULAR | Status: DC | PRN

## 2012-06-17 MED ORDER — SODIUM CHLORIDE 0.9 % IV SOLN: 250.0000 mL | INTRAVENOUS | Status: DC

## 2012-06-17 MED ORDER — MORPHINE SULFATE 2 MG/ML IJ SOLN: 1.0000 mg | INTRAMUSCULAR | Status: DC | PRN

## 2012-06-17 MED ORDER — THROMBIN 20000 UNITS EX SOLR
CUTANEOUS | Status: AC
  Filled 2012-06-17: qty 20000

## 2012-06-17 MED ORDER — LINAGLIPTIN 5 MG PO TABS
5.0000 mg | ORAL_TABLET | Freq: Every day | ORAL | Status: DC
  Administered 2012-06-17 – 2012-06-18 (×2): 5 mg via ORAL
  Filled 2012-06-17 (×2): qty 1

## 2012-06-17 MED ORDER — PHENOL 1.4 % MT LIQD: 1.0000 | OROMUCOSAL | Status: DC | PRN

## 2012-06-17 MED ORDER — ATORVASTATIN CALCIUM 40 MG PO TABS
40.0000 mg | ORAL_TABLET | Freq: Every day | ORAL | Status: DC
  Administered 2012-06-17: 40 mg via ORAL
  Filled 2012-06-17 (×2): qty 1

## 2012-06-17 MED ORDER — MENTHOL 3 MG MT LOZG
1.0000 | LOZENGE | OROMUCOSAL | Status: DC | PRN
  Filled 2012-06-17: qty 9

## 2012-06-17 MED ORDER — BUPIVACAINE-EPINEPHRINE 0.5% -1:200000 IJ SOLN
INTRAMUSCULAR | Status: DC | PRN
  Administered 2012-06-17: 10 mL

## 2012-06-17 MED ORDER — 0.9 % SODIUM CHLORIDE (POUR BTL) OPTIME
TOPICAL | Status: DC | PRN
  Administered 2012-06-17: 1000 mL

## 2012-06-17 MED ORDER — ACETAMINOPHEN 10 MG/ML IV SOLN
INTRAVENOUS | Status: AC
  Administered 2012-06-17: 1000 mg via INTRAVENOUS
  Filled 2012-06-17: qty 100

## 2012-06-17 MED ORDER — GABAPENTIN 100 MG PO CAPS
100.0000 mg | ORAL_CAPSULE | Freq: Every day | ORAL | Status: DC
  Administered 2012-06-17: 100 mg via ORAL
  Filled 2012-06-17 (×2): qty 1

## 2012-06-17 MED ORDER — LACTATED RINGERS IV SOLN
INTRAVENOUS | Status: DC | PRN
  Administered 2012-06-17 (×2): via INTRAVENOUS

## 2012-06-17 MED ORDER — METFORMIN HCL 500 MG PO TABS
1000.0000 mg | ORAL_TABLET | Freq: Two times a day (BID) | ORAL | Status: DC
  Filled 2012-06-17 (×4): qty 2

## 2012-06-17 MED ORDER — MEPERIDINE HCL 25 MG/ML IJ SOLN: 6.2500 mg | INTRAMUSCULAR | Status: DC | PRN

## 2012-06-17 MED ORDER — PANTOPRAZOLE SODIUM 40 MG PO TBEC
40.0000 mg | DELAYED_RELEASE_TABLET | Freq: Every day | ORAL | Status: DC
  Administered 2012-06-17 – 2012-06-18 (×2): 40 mg via ORAL
  Filled 2012-06-17 (×2): qty 1

## 2012-06-17 MED ORDER — GEMFIBROZIL 600 MG PO TABS
600.0000 mg | ORAL_TABLET | Freq: Two times a day (BID) | ORAL | Status: DC
  Administered 2012-06-17 – 2012-06-18 (×2): 600 mg via ORAL
  Filled 2012-06-17 (×3): qty 1

## 2012-06-17 MED ORDER — ACETAMINOPHEN 650 MG RE SUPP: 650.0000 mg | RECTAL | Status: DC | PRN

## 2012-06-17 MED ORDER — SENNOSIDES-DOCUSATE SODIUM 8.6-50 MG PO TABS
1.0000 | ORAL_TABLET | Freq: Every day | ORAL | Status: DC
  Administered 2012-06-17 – 2012-06-18 (×2): 1 via ORAL
  Filled 2012-06-17: qty 1

## 2012-06-17 MED ORDER — LIDOCAINE HCL (CARDIAC) 20 MG/ML IV SOLN
INTRAVENOUS | Status: DC | PRN
  Administered 2012-06-17: 50 mg via INTRAVENOUS

## 2012-06-17 SURGICAL SUPPLY — 59 items
ADH SKN CLS APL DERMABOND .7 (GAUZE/BANDAGES/DRESSINGS) ×1
BUR RND FLUTED 2.5 (BURR) IMPLANT
BUR ROUND FLUTED 4 SOFT TCH (BURR) ×2 IMPLANT
BUR SABER RD CUTTING 3.0 (BURR) IMPLANT
CANISTER SUCTION 2500CC (MISCELLANEOUS) ×2 IMPLANT
CLOTH BEACON ORANGE TIMEOUT ST (SAFETY) ×2 IMPLANT
CORDS BIPOLAR (ELECTRODE) ×2 IMPLANT
COVER MAYO STAND STRL (DRAPES) ×2 IMPLANT
COVER SURGICAL LIGHT HANDLE (MISCELLANEOUS) ×2 IMPLANT
DERMABOND ADVANCED (GAUZE/BANDAGES/DRESSINGS) ×1
DERMABOND ADVANCED .7 DNX12 (GAUZE/BANDAGES/DRESSINGS) ×1 IMPLANT
DRAPE C-ARM 42X72 X-RAY (DRAPES) ×2 IMPLANT
DRAPE MICROSCOPE LEICA (MISCELLANEOUS) ×4 IMPLANT
DRAPE POUCH INSTRU U-SHP 10X18 (DRAPES) ×2 IMPLANT
DRAPE PROXIMA HALF (DRAPES) ×2 IMPLANT
DRAPE SURG 17X23 STRL (DRAPES) ×8 IMPLANT
DRSG MEPILEX BORDER 4X4 (GAUZE/BANDAGES/DRESSINGS) ×2 IMPLANT
DRSG MEPILEX BORDER 4X8 (GAUZE/BANDAGES/DRESSINGS) IMPLANT
DURAPREP 26ML APPLICATOR (WOUND CARE) ×2 IMPLANT
ELECT BLADE 4.0 EZ CLEAN MEGAD (MISCELLANEOUS) ×2
ELECT CAUTERY BLADE 6.4 (BLADE) ×2 IMPLANT
ELECT REM PT RETURN 9FT ADLT (ELECTROSURGICAL) ×2
ELECTRODE BLDE 4.0 EZ CLN MEGD (MISCELLANEOUS) ×1 IMPLANT
ELECTRODE REM PT RTRN 9FT ADLT (ELECTROSURGICAL) ×1 IMPLANT
GLOVE BIO SURGEON STRL SZ 6.5 (GLOVE) ×6 IMPLANT
GLOVE BIOGEL PI IND STRL 7.0 (GLOVE) ×2 IMPLANT
GLOVE BIOGEL PI IND STRL 7.5 (GLOVE) ×1 IMPLANT
GLOVE BIOGEL PI INDICATOR 7.0 (GLOVE) ×2
GLOVE BIOGEL PI INDICATOR 7.5 (GLOVE) ×1
GLOVE ECLIPSE 7.0 STRL STRAW (GLOVE) ×2 IMPLANT
GLOVE ECLIPSE 8.5 STRL (GLOVE) ×2 IMPLANT
GLOVE SS BIOGEL STRL SZ 7 (GLOVE) ×1 IMPLANT
GLOVE SUPERSENSE BIOGEL SZ 7 (GLOVE) ×1
GLOVE SURG 8.5 LATEX PF (GLOVE) ×2 IMPLANT
GOWN PREVENTION PLUS LG XLONG (DISPOSABLE) IMPLANT
GOWN PREVENTION PLUS XXLARGE (GOWN DISPOSABLE) ×2 IMPLANT
GOWN STRL NON-REIN LRG LVL3 (GOWN DISPOSABLE) ×4 IMPLANT
KIT BASIN OR (CUSTOM PROCEDURE TRAY) ×2 IMPLANT
KIT ROOM TURNOVER OR (KITS) ×2 IMPLANT
NEEDLE 22X1 1/2 (OR ONLY) (NEEDLE) ×2 IMPLANT
NEEDLE SPNL 18GX3.5 QUINCKE PK (NEEDLE) ×4 IMPLANT
NS IRRIG 1000ML POUR BTL (IV SOLUTION) ×2 IMPLANT
PACK LAMINECTOMY ORTHO (CUSTOM PROCEDURE TRAY) ×2 IMPLANT
PAD ARMBOARD 7.5X6 YLW CONV (MISCELLANEOUS) ×4 IMPLANT
PATTIES SURGICAL .5 X.5 (GAUZE/BANDAGES/DRESSINGS) IMPLANT
PATTIES SURGICAL .75X.75 (GAUZE/BANDAGES/DRESSINGS) ×2 IMPLANT
SPONGE LAP 4X18 X RAY DECT (DISPOSABLE) IMPLANT
SPONGE SURGIFOAM ABS GEL 100 (HEMOSTASIS) IMPLANT
SUT VIC AB 1 CT1 27 (SUTURE)
SUT VIC AB 1 CT1 27XBRD ANBCTR (SUTURE) IMPLANT
SUT VIC AB 2-0 CT1 27 (SUTURE) ×2
SUT VIC AB 2-0 CT1 TAPERPNT 27 (SUTURE) ×1 IMPLANT
SUT VICRYL 0 UR6 27IN ABS (SUTURE) ×2 IMPLANT
SUT VICRYL 4-0 PS2 18IN ABS (SUTURE) ×2 IMPLANT
SYR CONTROL 10ML LL (SYRINGE) ×2 IMPLANT
TOWEL OR 17X24 6PK STRL BLUE (TOWEL DISPOSABLE) ×2 IMPLANT
TOWEL OR 17X26 10 PK STRL BLUE (TOWEL DISPOSABLE) ×2 IMPLANT
TRAY FOLEY CATH 14FR (SET/KITS/TRAYS/PACK) IMPLANT
WATER STERILE IRR 1000ML POUR (IV SOLUTION) ×2 IMPLANT

## 2012-06-17 NOTE — Transfer of Care (Signed)
Immediate Anesthesia Transfer of Care Note  Patient: Antonio Adams  Procedure(s) Performed: Procedure(s) (LRB) with comments: MICRODISCECTOMY LUMBAR LAMINECTOMY (N/A) - Right L5-S1 microdiscectomy  Patient Location: PACU  Anesthesia Type:General  Level of Consciousness: sedated  Airway & Oxygen Therapy: Patient Spontanous Breathing and Patient connected to nasal cannula oxygen  Post-op Assessment: Report given to PACU RN and Post -op Vital signs reviewed and stable  Post vital signs: Reviewed and stable  Complications: No apparent anesthesia complications

## 2012-06-17 NOTE — H&P (Signed)
Patient was seen and examined in the preop holding area. There has been no interval  Change in this patient's exam preop  history and physical exam  Lab tests and images have been examined and reviewed.  The Risks benefits and alternative treatments have been discussed  extensively,questions answered.  The patient has elected to undergo the discussed surgical treatment. 

## 2012-06-17 NOTE — Brief Op Note (Signed)
06/17/2012  9:54 AM  PATIENT:  Rockney Ghee  67 y.o. male  PRE-OPERATIVE DIAGNOSIS:  Recurrent right L5-S1 HNP  POST-OPERATIVE DIAGNOSIS:  Recurrent right L5-S1 HNP  PROCEDURE:  Procedure(s) (LRB) with comments: MICRODISCECTOMY LUMBAR LAMINECTOMY (N/A) - Right L5-S1 microdiscectomy  SURGEON:  Surgeon(s) and Role:    * Jessy Oto, MD - Primary  PHYSICIAN ASSISTANT: Phillips Hay PAC  ASSISTANTS: none   ANESTHESIA:   general  EBL:  Total I/O In: 1000 [I.V.:1000] Out: -   BLOOD ADMINISTERED:none  DRAINS: none   LOCAL MEDICATIONS USED:  MARCAINE     SPECIMEN:  No Specimen  DISPOSITION OF SPECIMEN:  N/A  COUNTS:  YES  TOURNIQUET:  * No tourniquets in log *  DICTATION: .Note written in EPIC  PLAN OF CARE: Admit for overnight observation  PATIENT DISPOSITION:  PACU - hemodynamically stable.   Delay start of Pharmacological VTE agent (>24hrs) due to surgical blood loss or risk of bleeding: yes

## 2012-06-17 NOTE — Anesthesia Postprocedure Evaluation (Signed)
  Anesthesia Post-op Note  Patient: Antonio Adams  Procedure(s) Performed: Procedure(s) (LRB) with comments: MICRODISCECTOMY LUMBAR LAMINECTOMY (N/A) - Right L5-S1 microdiscectomy  Patient Location: PACU  Anesthesia Type:General  Level of Consciousness: awake and alert   Airway and Oxygen Therapy: Patient Spontanous Breathing  Post-op Pain: mild  Post-op Assessment: Post-op Vital signs reviewed, Patient's Cardiovascular Status Stable and Respiratory Function Stable  Post-op Vital Signs: stable  Complications: No apparent anesthesia complications

## 2012-06-17 NOTE — Anesthesia Procedure Notes (Signed)
Procedure Name: Intubation Date/Time: 06/17/2012 7:47 AM Performed by: Vaughan Browner Pre-anesthesia Checklist: Patient identified, Emergency Drugs available, Suction available and Patient being monitored Patient Re-evaluated:Patient Re-evaluated prior to inductionOxygen Delivery Method: Circle system utilized Preoxygenation: Pre-oxygenation with 100% oxygen Intubation Type: IV induction Ventilation: Mask ventilation without difficulty and Oral airway inserted - appropriate to patient size Laryngoscope Size: Mac and 4 Grade View: Grade III Tube type: Oral Tube size: 8.0 mm Number of attempts: 2 Airway Equipment and Method: Stylet Placement Confirmation: ETT inserted through vocal cords under direct vision,  positive ETCO2 and breath sounds checked- equal and bilateral Secured at: 24 cm Tube secured with: Tape Dental Injury: Teeth and Oropharynx as per pre-operative assessment  Difficulty Due To: Difficulty was anticipated and Difficult Airway- due to anterior larynx Comments: DVL x1 by SRNA - unable to obtain view of cords.  DVL x1 by CRNA - grade 3 view.  ETT passed thru vocal cords.  + ETCO2 +/= BS.

## 2012-06-17 NOTE — Op Note (Signed)
06/17/2012  9:57 AM  PATIENT:  Antonio Adams  67 y.o. male  MRN: [id number]  OPERATIVE REPORT  PRE-OPERATIVE DIAGNOSIS:  Recurrent right L5-S1 HNP  POST-OPERATIVE DIAGNOSIS:  Recurrent right L5-S1 HNP, With 3 large free fragments.  PROCEDURE:  Procedure(s): MICRODISCECTOMY LUMBAR LAMINECTOMY WITH MIS APPROACH and MICROSCOPE    SURGEON:  Jessy Oto, MD     ASSISTANT:  Phillips Hay, PA-C  (Present throughout the entire procedure and necessary for completion of procedure in a timely manner)     ANESTHESIA:  General, Dr. Finis Bud. Local infiltration with Marcaine 1/2% with epinephrine 1/200,000 total 10 cc.    COMPLICATIONS:  None.   PROCEDURE:The patient was met in the holding area, and the appropriate Right Lumbar level L5-S1 identified and marked with "x" and my initials.The patient was then transported to OR and was placed under general anesthesia without difficulty. The patient received appropriate preoperative antibiotic prophylaxis. The patient after intubation atraumatically was transferred to the operating room table, prone position, Wilson frame, sliding OR table. All pressure points were well padded. The arms in 90-90 well-padded at the elbows. Standard prep with DuraPrep solution lower dorsal spine to the mid sacral segment. Draped in the usual manner iodine Vi-Drape was used. Time-out procedure was called and correct. 2x 18-gauge spinal needle was then inserted at the expected L5-S1 level.  C-arm was draped sterilely to the field and used to identify the spinal needles positions. The needle was at the lower aspect of the lamina of L5. Skin superior to this was then infiltrated with Marcaine half percent with 1-200,000 epinephrine total of 10 cc used. An incision approximately an inch inch and a half in length was then made through skin and subcutaneous layers in line with the left side of the expected midline just superior to the spinal needle entry point. An  incision made into the left lumbosacral fascia approximately an inch in length .   Smallest dilator was then introduced into the incision site and used to carefully form subperiosteal movement of the hip paralumbar muscles off of the posterior lamina of the expected L5-S1 level. Successive dilators were then carried up to the 11 mm size. The depth measured off of the dilators at about 50 mm and 50 mm retractors and placed on the scaffolding for the MIS equipment and guided over dilators down to and docking on the posterior aspect of the lamina at the expected L5-S1 level. This was sterilely attached to the articulating arm and it's up right which had been attached the OR table sterilely. C-arm fluoroscopy was identified the dilators and the retractors at the appropriate level L5-S1. The operating room microscope sterilely draped brought into the field. Under the operating room microscope, the L5-S1 interspace carefully debrided the small amount of muscle attachment here and high-speed bur used to drill the medial aspect of the inferior articular process of L5 approximately 10%. A localization lateral C-arm view was obtained with Penfield 4 in the L5-S1 facet. 2 mm Kerrison then used to enter the spinal canal over the superior aspect of the S1 lamina carefully using the Kerrison to debris the attachment as a curet. Foraminotomy was then performed over theS1 nerve root. The medial 10% superior articular process of S1 and then resected using 2 mm Kerrison. This allowed for identification of the thecal sac. Penfield 4 was then used to carefully mobilize the thecal sac medially and the S1 nerve root identified within the lateral recess flattened over the posterior aspect  of the herniated disc. Carefully the lateral aspect of the S1 nerve root was identified and a Penfield 4 was used to mobilize the nerve medially such that the herniated disc was visible with microscope. Using a Penfield 4 for retraction and a 15 blade  scalpel was used to incise the posterior longitudinal ligament within the lateral recess on the left side longitudinally. Disc material immediately extruded and this was removed using micropituitary rongeurs and nerve hook nerve root and then more easily able to be mobilized medially and retracted using a love retractor. Further foraminotomies was performed over the L5 nerve root the nerve root was noted to be without further compression. The nerve root able to be retracted along the medial aspect of the S1pedicle and disc material found to be subligamentous at this level was further resected current pituitary rongeurs. Ligamentum flavum was further debrided superiorly to the level L5-S1 disc. Had a moderate amount of further resection of the S1 lamina inferiorly was performed. With this then the disc space at L5-S1 was easily visualized and entry into the disc at the sided disc herniation was possible using a Penfield 4 intraoperative Lateral radiograph was used to identify the L5-S1 disc with the Penfield 4 In place just below the disc space.  Micropituitary was used to further debride this material superficially from the posterior aspect of the intervertebral disc is posterior lateral aspect of the disc. Small amount of further disc material was found subligamentous extending inferiorly from the disc this was removed using micropituitary rongeurs. Ligamentum flavum was debrided and lateral recess along the medial aspect L5-S1 facet no further decompression was necessary. Ball tip nerve probe was then able to carefully palpate the neuroforamen for L5and S1 finding these to be well decompressed. Bleeding was then controlled using thrombin-soaked Gelfoam small cottonoids.  Small amount of bleeding within the soft tissue mass the laminotomy area was controlled using bipolar electrocautery. Irrigation was carried out using copious amounts of irrigant solution. All Gelfoam  were then removed. No significant active  bleeding present at the time of removal. All instruments sponge counts were correct traction system was then carefully removed carefully rotating retractors with this withdrawal and only bipolar electrocautery of any small bleeders. Lumbodorsal fascia was then carefully approximated with interrupted 0 Vicryl sutures, UR 6 needle deep subcutaneous layers were approximated with interrupted 0 Vicryl sutures on UR 6 the appear subcutaneous layers approximated with interrupted 2-0 Vicryl sutures and the skin closed with a running subcutaneous stitch of 4-0 Vicryl. Dermabond was applied allowed to dry and then Mepilex bandage applied. Patient was then carefully returned to supine position on a stretcher, reactivated and extubated. He was then returned to recovery room in satisfactory condition.  Phillips Hay PA-C perform the duties of assistant surgeon during this case. She was present from the beginning of the case to the end of the case assisting in transfer the patient from his stretcher to the OR table and back to the stretcher at the end of the case. Assisted in careful retraction and suction of the laminectomy site delicate neural structures operating under the operating room microscope. She performed closure of the incision from the fascia to the skin applying the dressing.     NITKA,JAMES E 06/17/2012, 9:57 AM

## 2012-06-17 NOTE — Anesthesia Preprocedure Evaluation (Signed)
Anesthesia Evaluation  Patient identified by MRN, date of birth, ID band Patient awake    Airway Mallampati: I      Dental  (+) Teeth Intact   Pulmonary sleep apnea ,  breath sounds clear to auscultation        Cardiovascular hypertension, Rhythm:Regular Rate:Normal     Neuro/Psych    GI/Hepatic Neg liver ROS, GERD-  ,  Endo/Other  diabetes  Renal/GU negative Renal ROS     Musculoskeletal negative musculoskeletal ROS (+)   Abdominal   Peds  Hematology negative hematology ROS (+)   Anesthesia Other Findings   Reproductive/Obstetrics                           Anesthesia Physical Anesthesia Plan  ASA: II  Anesthesia Plan: General   Post-op Pain Management:    Induction: Intravenous  Airway Management Planned: Oral ETT  Additional Equipment:   Intra-op Plan:   Post-operative Plan: Extubation in OR  Informed Consent: I have reviewed the patients History and Physical, chart, labs and discussed the procedure including the risks, benefits and alternatives for the proposed anesthesia with the patient or authorized representative who has indicated his/her understanding and acceptance.   Dental advisory given  Plan Discussed with: CRNA and Surgeon  Anesthesia Plan Comments:         Anesthesia Quick Evaluation

## 2012-06-18 LAB — GLUCOSE, CAPILLARY: Glucose-Capillary: 135 mg/dL — ABNORMAL HIGH (ref 70–99)

## 2012-06-18 MED ORDER — METHOCARBAMOL 500 MG PO TABS: 500.0000 mg | ORAL_TABLET | Freq: Four times a day (QID) | ORAL | Status: DC | PRN

## 2012-06-18 MED ORDER — BISACODYL 5 MG PO TBEC: 5.0000 mg | DELAYED_RELEASE_TABLET | Freq: Every day | ORAL | Status: DC | PRN

## 2012-06-18 MED ORDER — OXYCODONE-ACETAMINOPHEN 5-325 MG PO TABS: 1.0000 | ORAL_TABLET | ORAL | Status: DC | PRN

## 2012-06-18 NOTE — Progress Notes (Signed)
Pt discharged to home accompanied by family. Pts IV was removed. Discharge instructions and rx given and explained and pt stated understanding. Pt was instructed to follow up with Dr. Louanne Skye in 2 weeks on 07/03/12. Pt left unit in a stable condition via wheelchair.

## 2012-06-18 NOTE — Progress Notes (Signed)
Subjective: 1 Day Post-Op Procedure(s) (LRB): MICRODISCECTOMY LUMBAR LAMINECTOMY (N/A) Awake alert Oriented x 4. I feel sore but I don't hurt. Still some numbness outside of right foot. Patient reports pain as mild.    Objective:   VITALS:  Temp:  [97.4 F (36.3 C)-99 F (37.2 C)] 98.5 F (36.9 C) (02/04 [id number]) Pulse Rate:  [61-93] 91  (02/04 0633) Resp:  [0-28] 18  (02/04 0633) BP: (105-132)/(61-79) 105/63 mmHg (02/04 0633) SpO2:  [93 %-100 %] 93 % (02/04 [id number])  ABD soft Neurovascular intact Intact pulses distally Dorsiflexion/Plantar flexion intact Incision: no drainage anesthesia right lateral foot S1 distribution. Out of bed in a recliner. Dressing changed.  LABS  Basename 06/17/12 0601  HGB 13.0  WBC 4.8  PLT 246   No results found for this basename: NA:2,K:2,CL:2,CO2:2,BUN:2,CREATININE:2,GLUCOSE:2, in the last 72 hours No results found for this basename: LABPT:2,INR:2 in the last 72 hours   Assessment/Plan: 1 Day Post-Op Procedure(s) (LRB): MICRODISCECTOMY LUMBAR LAMINECTOMY (N/A)  Advance diet Discharge home today. Has nartotics at home and no further narcotic prescription necessary. Will prescribe robaxin for muscle spasm. Discharge home later this am after walking in the hallway.  NITKA,JAMES E 06/18/2012, 8:41 AM

## 2012-06-18 NOTE — Discharge Summary (Signed)
Physician Discharge Summary  Patient ID: Antonio Adams MRN: [id number] DOB/AGE: 1945/06/13 67 y.o.  Admit date: 06/17/2012 Discharge date: 06/18/2012  Admission Diagnoses:  Principal Problem:  *Herniated nucleus pulposus, lumbar   Discharge Diagnoses:  Same  Past Medical History  Diagnosis Date  . HYPERLIPIDEMIA 03/16/2007    takes Crestor daily  . CARPAL TUNNEL SYNDROME, BILATERAL 03/16/2007  . ALLERGIC RHINITIS 03/16/2007  . BACK PAIN WITH RADICULOPATHY 11/23/2008  . LEG CRAMPS 05/22/2007  . PARESTHESIA 05/21/2007  . GASTROINTESTINAL HEMORRHAGE, HX OF 03/16/2007  . Overactive bladder   . Hemorrhoids   . ADENOCARCINOMA, PROSTATE, GLEASON GRADE 6 12/21/2009  . HYPERTENSION 03/16/2007    takes Diltiazem and Lisinopril daily   . Sleep apnea   . Chronic back pain   . Arthritis     back   . GERD 03/16/2007    takes Omeprazole daily  . Rectal bleeding     Dr.Norins has explained its from the Radiation that he has received  . History of colon polyps   . Urinary frequency     takes Toviaz daily  . Nocturia   . DIABETES MELLITUS, TYPE II 03/16/2007    takes Januvia and MEtformin daily  . Constipation     takes Senokot daily  . Glaucoma     mild  . Depression     occasionally    Surgeries: Procedure(s): MICRODISCECTOMY LUMBAR LAMINECTOMY on 06/17/2012   Consultants:    Discharged Condition: Improved  Hospital Course: Antonio Adams is an 67 y.o. male who was admitted 06/17/2012 with a chief complaint of No chief complaint on file. , and found to have a diagnosis of Herniated nucleus pulposus, lumbar.  They were brought to the operating room on 06/17/2012 and underwent the above named procedures.    They were given perioperative antibiotics:  Anti-infectives     Start     Dose/Rate Route Frequency Ordered Stop   06/17/12 1400   ciprofloxacin (CIPRO) tablet 250 mg        250 mg Oral 2 times daily 06/17/12 1305     06/17/12 1400   ceFAZolin (ANCEF) IVPB 1 g/50 mL premix         1 g 100 mL/hr over 30 Minutes Intravenous Every 8 hours 06/17/12 1304 06/17/12 2110   06/17/12 0600   ceFAZolin (ANCEF) IVPB 2 g/50 mL premix        2 g 100 mL/hr over 30 Minutes Intravenous On call to O.R. 06/16/12 1217 06/17/12 [id number]        .  They were given sequential compression devices, early ambulation, and chemoprophylaxis for DVT prophylaxis.  They benefited maximally from their hospital stay and there were no complications. On Post op day number 1 he was standing and ambulating with mild discomfort controlled with oral medications. Voiding without difficulty. Incision was  Dry with minimal swelling. He was discharged home on POD#1. Tolerating po medication and diet. Neurologic exam  Showed normal motor with some persistent anesthesia in the right S1 distribution.   Recent vital signs:  Filed Vitals:   06/18/12 0633  BP: 105/63  Pulse: 91  Temp: 98.5 F (36.9 C)  Resp: 18    Recent laboratory studies:  Results for orders placed during the hospital encounter of 06/17/12  CBC      Component Value Range   WBC 3.8 (*) 4.0 - 10.5 K/uL   RBC 4.55  4.22 - 5.81 MIL/uL   Hemoglobin 12.5 (*) 13.0 - 17.0 g/dL  HCT 37.1 (*) 39.0 - 52.0 %   MCV 81.5  78.0 - 100.0 fL   MCH 27.5  26.0 - 34.0 pg   MCHC 33.7  30.0 - 36.0 g/dL   RDW 15.4  11.5 - 15.5 %   Platelets 227  150 - 400 K/uL  CBC WITH DIFFERENTIAL      Component Value Range   WBC 4.8  4.0 - 10.5 K/uL   RBC 4.68  4.22 - 5.81 MIL/uL   Hemoglobin 13.0  13.0 - 17.0 g/dL   HCT 38.1 (*) 39.0 - 52.0 %   MCV 81.4  78.0 - 100.0 fL   MCH 27.8  26.0 - 34.0 pg   MCHC 34.1  30.0 - 36.0 g/dL   RDW 15.4  11.5 - 15.5 %   Platelets 246  150 - 400 K/uL   Neutrophils Relative 61  43 - 77 %   Neutro Abs 2.9  1.7 - 7.7 K/uL   Lymphocytes Relative 25  12 - 46 %   Lymphs Abs 1.2  0.7 - 4.0 K/uL   Monocytes Relative 13 (*) 3 - 12 %   Monocytes Absolute 0.6  0.1 - 1.0 K/uL   Eosinophils Relative 2  0 - 5 %   Eosinophils  Absolute 0.1  0.0 - 0.7 K/uL   Basophils Relative 0  0 - 1 %   Basophils Absolute 0.0  0.0 - 0.1 K/uL  GLUCOSE, CAPILLARY      Component Value Range   Glucose-Capillary 78  70 - 99 mg/dL  GLUCOSE, CAPILLARY      Component Value Range   Glucose-Capillary 92  70 - 99 mg/dL   Comment 1 Notify RN    GLUCOSE, CAPILLARY      Component Value Range   Glucose-Capillary 93  70 - 99 mg/dL  GLUCOSE, CAPILLARY      Component Value Range   Glucose-Capillary 104 (*) 70 - 99 mg/dL  GLUCOSE, CAPILLARY      Component Value Range   Glucose-Capillary 93  70 - 99 mg/dL  GLUCOSE, CAPILLARY      Component Value Range   Glucose-Capillary 135 (*) 70 - 99 mg/dL    Discharge Medications:     Medication List     As of 06/18/2012  8:59 AM    TAKE these medications         aspirin 325 MG tablet   Take 325 mg by mouth daily.      bisacodyl 5 MG EC tablet   Commonly known as: DULCOLAX   Take 1 tablet (5 mg total) by mouth daily as needed.      Cholecalciferol 1000 UNITS capsule   Take 1,000 Units by mouth daily.      ciprofloxacin 250 MG tablet   Commonly known as: CIPRO   Take 250 mg by mouth 2 (two) times daily.      diltiazem 120 MG 24 hr capsule   Commonly known as: DILACOR XR   Take 120 mg by mouth daily.      gabapentin 100 MG capsule   Commonly known as: NEURONTIN   Take 100 mg by mouth at bedtime.      gemfibrozil 600 MG tablet   Commonly known as: LOPID   Take 600 mg by mouth 2 (two) times daily.      glucose blood test strip   1 each by Other route 2 (two) times daily. And lancets 2/day 250.03      lisinopril 40  MG tablet   Commonly known as: PRINIVIL,ZESTRIL   Take 20 mg by mouth daily.      metFORMIN 1000 MG tablet   Commonly known as: GLUCOPHAGE   Take 1 tablet (1,000 mg total) by mouth 2 (two) times daily with a meal.      metFORMIN 1000 MG tablet   Commonly known as: GLUCOPHAGE   Take 1,000 mg by mouth 2 (two) times daily with a meal.      methocarbamol 500 MG  tablet   Commonly known as: ROBAXIN   Take 1 tablet (500 mg total) by mouth every 6 (six) hours as needed.      multivitamin tablet   Take 1 tablet by mouth daily.      OMEGA 3 1200 MG Caps   Take 1,200 mg by mouth daily.      omeprazole 40 MG capsule   Commonly known as: PRILOSEC   Take 40 mg by mouth daily.      oxyCODONE-acetaminophen 5-325 MG per tablet   Commonly known as: PERCOCET/ROXICET   Take 1-2 tablets by mouth every 4 (four) hours as needed.      rosuvastatin 20 MG tablet   Commonly known as: CRESTOR   Take 20 mg by mouth at bedtime.      senna-docusate 8.6-50 MG per tablet   Commonly known as: Senokot-S   Take 1 tablet by mouth daily.      sitaGLIPtin 100 MG tablet   Commonly known as: JANUVIA   Take 100 mg by mouth daily.      TOVIAZ 4 MG Tb24   Generic drug: fesoterodine   Take 4 mg by mouth daily.        Diagnostic Studies: Mr Lumbar Spine Wo Contrast  06/03/2012  *RADIOLOGY REPORT*  Clinical Data: Low back pain and right leg pain.  Prior lumbar surgery 2013.  History prostate cancer.  MRI LUMBAR SPINE WITHOUT CONTRAST  Technique:  Multiplanar and multiecho pulse sequences of the lumbar spine were obtained without intravenous contrast.  Comparison: 04/15/2011  Findings: As on the prior exam, the lowest full intervertebral disk space is labeled L5-S1.  If procedural intervention is to be performed, careful correlation with this numbering strategy is recommended.  The conus medullaris appears unremarkable.  Conus level:  L1.  Dextroconvex lumbar scoliosis noted with rotary component. Bridging spurring of the upper sacroiliac joints noted.  Type 2 degenerative endplate findings noted at L5-S1 and L4-5.  No findings of osseous metastatic disease to the lumbar spine.  Additional findings at individual levels are as follows:  L1-2:  Unremarkable.  L2-3:  Borderline foraminal stenosis due to diffuse disc bulge. The disc bulge abuts the right L2 nerve in the lateral  extraforaminal space.  L3-4:  Mild bilateral foraminal stenosis due to disc bulge.  L4-5:  Moderate left and borderline right foraminal stenosis noted with mild bilateral subarticular lateral recess stenosis and borderline central stenosis secondary to diffuse disc bulge, central disc protrusion, and facet and intervertebral spurring.  L5-S1: Severe right subarticular lateral recess stenosis and moderate right eccentric central stenosis due to a large recurrent disc extrusion or disc fragment in the right lateral recess, previously about 0.7 cm in diameter, currently 1.6 x 1.4 cm.  There is moderate to prominent bilateral foraminal stenosis primarily attributable to facet arthropathy and intervertebral spurring, and an underlying disc bulge is present. Prior right hemilaminectomy.  IMPRESSION:  1.  Lumbar spondylosis and degenerative disc disease cause severe impingement at L5-S1, moderate  impingement L4-5, and mild impingement at L3-4 as discussed above.  With regard to the L5-S1 level, there is been considerable enlargement in the recurrent right lateral recess disc extrusion which currently measures up to 1.6 cm in diameter.   Original Report Authenticated By: Van Clines, M.D.    Dg Lumbar Spine 1 View  06/17/2012  *RADIOLOGY REPORT*  Clinical Data: Lumbar microdiskectomy  LUMBAR SPINE - 1 VIEW  Comparison: 06/03/2012  Findings: A single image shows a probe posterior to the L5-S1 disc level.  IMPRESSION: L5-S1 localized.   Original Report Authenticated By: Nelson Chimes, M.D.     Disposition: 01-Home or Self Care      Discharge Orders    Future Orders Please Complete By Expires   Diet - low sodium heart healthy      Diet Carb Modified      Call MD / Call 911      Comments:   If you experience chest pain or shortness of breath, CALL 911 and be transported to the hospital emergency room.  If you develope a fever above 101 F, pus (white drainage) or increased drainage or redness at the wound, or  calf pain, call your surgeon's office.   Constipation Prevention      Comments:   Drink plenty of fluids.  Prune juice may be helpful.  You may use a stool softener, such as Colace (over the counter) 100 mg twice a day.  Use MiraLax (over the counter) for constipation as needed.   Increase activity slowly as tolerated      Discharge instructions      Comments:   No lifting greater than 10 lbs. Avoid bending, stooping and twisting. Walk in house for first week them may start to get out slowly increasing distance up to one mile by 3 weeks post op. Keep incision dry for 3 days, may use tegaderm or similar water impervious dressing.   Driving restrictions      Comments:   No driving for 2 weeks   Lifting restrictions      Comments:   No lifting for 6 weeks      Follow-up Information    Follow up with Lindyn Vossler E, MD. In 2 weeks.   Contact information:   [place] [address] [phone]           Signed: Jessy Oto 06/18/2012, 8:59 AM

## 2012-06-18 NOTE — Brief Op Note (Signed)
Patient's brief op note reviewed with Lonzo Candy.

## 2012-06-19 ENCOUNTER — Encounter (HOSPITAL_COMMUNITY): Payer: Self-pay | Admitting: Specialist

## 2012-07-02 ENCOUNTER — Telehealth: Payer: Self-pay | Admitting: *Deleted

## 2012-07-02 NOTE — Telephone Encounter (Signed)
Pt called stating he would like a referral to a foot dr about his feet or would Dr Linda Hedges like him to come in to see him first. Please advise.

## 2012-07-02 NOTE — Telephone Encounter (Signed)
OV will help determine appropriate referral, i.e. Podiatry vs orthopedics

## 2012-07-03 NOTE — Telephone Encounter (Signed)
Appt made for Feb. 26.

## 2012-07-10 ENCOUNTER — Encounter: Payer: Self-pay | Admitting: Internal Medicine

## 2012-07-10 ENCOUNTER — Ambulatory Visit (INDEPENDENT_AMBULATORY_CARE_PROVIDER_SITE_OTHER): Payer: Medicare Other | Admitting: Internal Medicine

## 2012-07-10 VITALS — BP 128/86 | HR 82 | Temp 97.2°F | Resp 10 | Wt 234.0 lb

## 2012-07-10 DIAGNOSIS — R209 Unspecified disturbances of skin sensation: Secondary | ICD-10-CM

## 2012-07-13 NOTE — Assessment & Plan Note (Signed)
Paartial paresthesia right foot. The location is more suggestive of lasting neuropathy from HNP lumbar spine rather than Diabetic neuropathy.  Plan  Patient education  For persistent or worsening  Symptoms will need NCS.

## 2012-07-13 NOTE — Progress Notes (Signed)
Subjective:    Patient ID: Antonio Adams, male    DOB: 1946/03/03, 67 y.o.   MRN: [id number]  HPI Mr. Corfield presents for persistent chronic foot pain which he describes as loss of feeling along the lateral aspect and lateral-dorsal aspect of the right foot. The discomfort does affect his at night with some interruption of sleep.   Past Medical History  Diagnosis Date  . HYPERLIPIDEMIA 03/16/2007    takes Crestor daily  . CARPAL TUNNEL SYNDROME, BILATERAL 03/16/2007  . ALLERGIC RHINITIS 03/16/2007  . BACK PAIN WITH RADICULOPATHY 11/23/2008  . LEG CRAMPS 05/22/2007  . PARESTHESIA 05/21/2007  . GASTROINTESTINAL HEMORRHAGE, HX OF 03/16/2007  . Overactive bladder   . Hemorrhoids   . ADENOCARCINOMA, PROSTATE, GLEASON GRADE 6 12/21/2009  . HYPERTENSION 03/16/2007    takes Diltiazem and Lisinopril daily   . Sleep apnea   . Chronic back pain   . Arthritis     back   . GERD 03/16/2007    takes Omeprazole daily  . Rectal bleeding     Dr.Norins has explained its from the Radiation that he has received  . History of colon polyps   . Urinary frequency     takes Toviaz daily  . Nocturia   . DIABETES MELLITUS, TYPE II 03/16/2007    takes Januvia and MEtformin daily  . Constipation     takes Senokot daily  . Glaucoma     mild  . Depression     occasionally   Past Surgical History  Procedure Laterality Date  . Back surgery  1983, 2008    repeat surgery; ESI '08  . Shoulder surgery  LFT  . Carpal tunnel release      bilateral  . Stress cardiolite  12/24/2003    negative for ischemia  . Wound exploration      management of a wound that he sustained in the [employer]  . Prostate surgery  march 2012    seed implant  . Cervical fusion    . Lumbar laminectomy  10/30/2011    Procedure: MICRODISCECTOMY LUMBAR LAMINECTOMY;  Surgeon: Jessy Oto, MD;  Location: [place];  Service: Orthopedics;  Laterality: Right;  Right L4-5 and L5-S1 Microdiscectomy  . Colonosocpy    .  Esophagogastroduodenoscopy      ED  . Lumbar laminectomy  06/17/2012    Procedure: MICRODISCECTOMY LUMBAR LAMINECTOMY;  Surgeon: Jessy Oto, MD;  Location: [facility];  Service: Orthopedics;  Laterality: N/A;  Right L5-S1 microdiscectomy   Family History  Problem Relation Age of Onset  . Cancer Sister 30    Cancer  . Hypertension Other   . Colon cancer Neg Hx   . Esophageal cancer Neg Hx   . Stomach cancer Neg Hx    History   Social History  . Marital Status: Married    Spouse Name: N/A    Number of Children: 2  . Years of Education: 12+   Occupational History  . Event organiser; Curator     retired from Event organiser   Social History Main Topics  . Smoking status: Never Smoker   . Smokeless tobacco: Never Used  . Alcohol Use: No  . Drug Use: No  . Sexually Active: Yes -- Male partner(s)   Other Topics Concern  . Not on file   Social History Narrative   Married-divorced '89, remarried '96. 2 sons, 1 daughter. 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 He  wishes to be a full resuscitation candidate.       Daily caffeine     Current Outpatient Prescriptions on File Prior to Visit  Medication Sig Dispense Refill  . aspirin 325 MG tablet Take 325 mg by mouth daily.        . bisacodyl (DULCOLAX) 5 MG EC tablet Take 1 tablet (5 mg total) by mouth daily as needed.  30 tablet  1  . Cholecalciferol 1000 UNITS capsule Take 1,000 Units by mouth daily.       . ciprofloxacin (CIPRO) 250 MG tablet Take 250 mg by mouth 2 (two) times daily.      Marland Kitchen diltiazem (DILACOR XR) 120 MG 24 hr capsule Take 120 mg by mouth daily.        . fesoterodine (TOVIAZ) 4 MG TB24 Take 4 mg by mouth daily.      Marland Kitchen gabapentin (NEURONTIN) 100 MG capsule Take 100 mg by mouth at bedtime.      Marland Kitchen gemfibrozil (LOPID) 600 MG tablet Take 600 mg by mouth 2 (two) times daily.       Marland Kitchen glucose blood (ONE TOUCH ULTRA TEST) test strip 1 each by Other route 2 (two)  times daily. And lancets 2/day 250.03  100 each  12  . lisinopril (PRINIVIL,ZESTRIL) 40 MG tablet Take 20 mg by mouth daily.       . metFORMIN (GLUCOPHAGE) 1000 MG tablet Take 1 tablet (1,000 mg total) by mouth 2 (two) times daily with a meal.  60 tablet  1  . metFORMIN (GLUCOPHAGE) 1000 MG tablet Take 1,000 mg by mouth 2 (two) times daily with a meal.      . metFORMIN (GLUCOPHAGE) 1000 MG tablet Take 1 tablet (1,000 mg total) by mouth 2 (two) times daily with a meal.  60 tablet  1  . methocarbamol (ROBAXIN) 500 MG tablet Take 1 tablet (500 mg total) by mouth every 6 (six) hours as needed.  60 tablet  2  . Multiple Vitamin (MULTIVITAMIN) tablet Take 1 tablet by mouth daily.        . OMEGA 3 1200 MG CAPS Take 1,200 mg by mouth daily.      Marland Kitchen omeprazole (PRILOSEC) 40 MG capsule Take 40 mg by mouth daily.      Marland Kitchen oxyCODONE-acetaminophen (PERCOCET/ROXICET) 5-325 MG per tablet Take 1-2 tablets by mouth every 4 (four) hours as needed.  30 tablet    . rosuvastatin (CRESTOR) 20 MG tablet Take 20 mg by mouth at bedtime.      . senna-docusate (SENOKOT-S) 8.6-50 MG per tablet Take 1 tablet by mouth daily.      . sitaGLIPtin (JANUVIA) 100 MG tablet Take 100 mg by mouth daily.       No current facility-administered medications on file prior to visit.      Review of Systems System review is negative for any constitutional, cardiac, pulmonary, GI or neuro symptoms or complaints other than as described in the HPI.     Objective:   Physical Exam Filed Vitals:   07/10/12 1354  BP: 128/86  Pulse: 82  Temp: 97.2 F (36.2 C)  Resp: 10   Wt Readings from Last 3 Encounters:  07/10/12 234 lb (106.142 kg)  06/13/12 213 lb 1.6 oz (96.662 kg)  06/04/12 238 lb 1.3 oz (107.992 kg)   gen'l - WNWD heavyset AA man in no acute distress Cor- 2+ DP/PT pulse right foot, normal skin temperature Pulm - RRR Neuro - A&O x 3, decreased sensation  to light-touch, pin-prick and vibration at the lateral aspect right foot,  lateral dorsum as well.       Assessment & Plan:

## 2012-07-30 ENCOUNTER — Other Ambulatory Visit: Payer: Self-pay | Admitting: Internal Medicine

## 2012-09-11 ENCOUNTER — Other Ambulatory Visit (HOSPITAL_COMMUNITY): Payer: Self-pay | Admitting: Specialist

## 2012-09-11 DIAGNOSIS — R0989 Other specified symptoms and signs involving the circulatory and respiratory systems: Secondary | ICD-10-CM

## 2012-09-16 ENCOUNTER — Ambulatory Visit (HOSPITAL_COMMUNITY)
Admission: RE | Admit: 2012-09-16 | Discharge: 2012-09-16 | Disposition: A | Discharge status: Home / Self Care | Payer: Medicare Other | Source: Ambulatory Visit | Attending: Specialist | Admitting: Specialist

## 2012-09-16 DIAGNOSIS — R0989 Other specified symptoms and signs involving the circulatory and respiratory systems: Secondary | ICD-10-CM | POA: Insufficient documentation

## 2012-09-16 DIAGNOSIS — M79609 Pain in unspecified limb: Secondary | ICD-10-CM

## 2012-09-16 NOTE — Progress Notes (Addendum)
VASCULAR LAB PRELIMINARY  ARTERIAL  ABI completed:    RIGHT    LEFT    PRESSURE WAVEFORM  PRESSURE WAVEFORM  BRACHIAL 145 Triphasic BRACHIAL 136 Triphasic  DP 154 Triphasic DP 177 Triphasic  AT   AT    PT 182 Triphasic PT 110 Triphasic  PER   PER    GREAT TOE  NA GREAT TOE  NA    RIGHT LEFT  ABI 1.26 1.22   ABIs are within normal limits bilaterally.  09/16/2012 11:56 AM Maudry Mayhew, RDMS, RDCS

## 2012-10-23 ENCOUNTER — Encounter: Payer: Self-pay | Admitting: Internal Medicine

## 2012-10-23 ENCOUNTER — Ambulatory Visit (INDEPENDENT_AMBULATORY_CARE_PROVIDER_SITE_OTHER): Payer: Medicare Other | Admitting: Internal Medicine

## 2012-10-23 VITALS — BP 132/86 | HR 88 | Ht 70.25 in | Wt 239.0 lb

## 2012-10-23 DIAGNOSIS — K219 Gastro-esophageal reflux disease without esophagitis: Secondary | ICD-10-CM

## 2012-10-23 DIAGNOSIS — K5900 Constipation, unspecified: Secondary | ICD-10-CM

## 2012-10-23 DIAGNOSIS — K627 Radiation proctitis: Secondary | ICD-10-CM

## 2012-10-23 DIAGNOSIS — K625 Hemorrhage of anus and rectum: Secondary | ICD-10-CM

## 2012-10-23 DIAGNOSIS — K6289 Other specified diseases of anus and rectum: Secondary | ICD-10-CM

## 2012-10-23 MED ORDER — POLYETHYLENE GLYCOL 3350 17 GM/SCOOP PO POWD: 17.0000 g | Freq: Every day | ORAL | Status: DC

## 2012-10-23 NOTE — Progress Notes (Signed)
HISTORY OF PRESENT ILLNESS:  Antonio Adams is a 67 y.o. male with diabetes mellitus, hypertension, hyperlipidemia, sleep apnea, prostate cancer status post seed implantation and external beam radiation, subsequent radiation proctitis diagnosed on colonoscopy June 2013, history of adenomatous colon polyps, history of small duodenal carcinoid diagnosed in 2002 and found to be stable on serial endoscopies, and GERD.Marland Kitchen He presents today with several complaints. First intermittent rectal bleeding. Second abdominal bloating associated with somewhat constipated bowels, and a vague choking sensation with phlegm that he notices at night. Patient reports passing blood with bowel movements several times per week. This does not staining his underwear. Not described as clots. Problems with Lodine constipated bowels have occurred over the past 6 months. He has taken laxatives periodically over the years to help with his bowels. Finally, she takes omeprazole 40 mg daily for reflux. Significant breakthrough symptoms. No esophageal dysphagia. Multiple chronic medical problems are stable. Hemoglobin earlier this year was normal at 13  REVIEW OF SYSTEMS:  All non-GI ROS negative except for back pain, itching, hematuria, sleeping problems, night sweats  Past Medical History  Diagnosis Date  . HYPERLIPIDEMIA 03/16/2007    takes Crestor daily  . CARPAL TUNNEL SYNDROME, BILATERAL 03/16/2007  . ALLERGIC RHINITIS 03/16/2007  . BACK PAIN WITH RADICULOPATHY 11/23/2008  . LEG CRAMPS 05/22/2007  . PARESTHESIA 05/21/2007  . GASTROINTESTINAL HEMORRHAGE, HX OF 03/16/2007  . Overactive bladder   . Hemorrhoids   . ADENOCARCINOMA, PROSTATE, GLEASON GRADE 6 12/21/2009  . HYPERTENSION 03/16/2007    takes Diltiazem and Lisinopril daily   . Sleep apnea   . Chronic back pain   . Arthritis     back   . GERD 03/16/2007    takes Omeprazole daily  . Rectal bleeding     Dr.Norins has explained its from the Radiation that he has received   . History of colon polyps   . Urinary frequency     takes Toviaz daily  . Nocturia   . DIABETES MELLITUS, TYPE II 03/16/2007    takes Januvia and MEtformin daily  . Constipation     takes Senokot daily  . Glaucoma     mild  . Depression     occasionally  . Proctitis     Past Surgical History  Procedure Laterality Date  . Back surgery  1983, 2008    repeat surgery; ESI '08  . Shoulder surgery  LFT  . Carpal tunnel release      bilateral  . Stress cardiolite  12/24/2003    negative for ischemia  . Wound exploration      management of a wound that he sustained in the [employer]  . Prostate surgery  march 2012    seed implant  . Cervical fusion    . Lumbar laminectomy  10/30/2011    Procedure: MICRODISCECTOMY LUMBAR LAMINECTOMY;  Surgeon: Jessy Oto, MD;  Location: [facility];  Service: Orthopedics;  Laterality: Right;  Right L4-5 and L5-S1 Microdiscectomy  . Colonosocpy    . Esophagogastroduodenoscopy      ED  . Lumbar laminectomy  06/17/2012    Procedure: MICRODISCECTOMY LUMBAR LAMINECTOMY;  Surgeon: Jessy Oto, MD;  Location: [place];  Service: Orthopedics;  Laterality: N/A;  Right L5-S1 microdiscectomy    Social History LINDSAY GIBEAULT  reports that he has never smoked. He has never used smokeless tobacco. He reports that he does not drink alcohol or use illicit drugs.  family history includes Cancer (age of onset: 15) in his  sister and Hypertension in his other.  There is no history of Colon cancer, and Esophageal cancer, and Stomach cancer, .  No Known Allergies     PHYSICAL EXAMINATION: Vital signs: BP 132/86  Pulse 88  Ht 5' 10.25" (1.784 m)  Wt 239 lb (108.41 kg)  BMI 34.06 kg/m2  Constitutional: generally well-appearing, no acute distress Psychiatric: alert and oriented x3, cooperative Eyes: extraocular movements intact, anicteric, conjunctiva pink Mouth: oral pharynx moist, no lesions Neck: supple no lymphadenopathy Cardiovascular: heart regular rate  and rhythm, no murmur Lungs: clear to auscultation bilaterally Abdomen: soft, obese, nontender, nondistended, no obvious ascites, no peritoneal signs, normal bowel sounds, no organomegaly Rectal: Omitted Extremities: no lower extremity edema bilaterally Skin: no lesions on visible extremities Neuro: No focal deficits. No asterixis.    ASSESSMENT:  #1. Intermittent bleeding secondary to known radiation proctitis. No indication for intervention, such as APC therapy, at this point. It's causing of no clinical issues and his hemoglobin is stable. #2. Bloating associated with constipation. Functional. #3. GERD. May be responsible for phlegm like symptoms. Alternatively, could be ENT or allergy related #4. History of adenomatous colon polyps. Surveillance up-to-date #5. History of small stable duodenal carcinoid.   PLAN:  #1. Reassurance regarding bleeding #2. Recommend multivitamin with iron to avoid anemia over time #3. Recommend MiraLax 1 scoop daily for constipation bloating. Titrate as needed #4. Continue PPI #5. Strict reflux precautions #6. Surveillance upper endoscopy around August 2017 #7. Surveillance colonoscopy around June 2018. Interval followup as needed

## 2012-10-23 NOTE — Patient Instructions (Addendum)
You may pick up Glycolax (Miralax) at the pharmacy - mix 1 scoop in water or juice daily for constipation  Continue taking Prilosec  Please follow up with Dr. Henrene Pastor as needed

## 2012-12-09 ENCOUNTER — Ambulatory Visit (INDEPENDENT_AMBULATORY_CARE_PROVIDER_SITE_OTHER): Payer: Medicare Other | Admitting: Internal Medicine

## 2012-12-09 ENCOUNTER — Encounter: Payer: Self-pay | Admitting: Internal Medicine

## 2012-12-09 ENCOUNTER — Other Ambulatory Visit (INDEPENDENT_AMBULATORY_CARE_PROVIDER_SITE_OTHER): Payer: Medicare Other

## 2012-12-09 VITALS — BP 142/74 | HR 72 | Temp 98.5°F | Wt 241.8 lb

## 2012-12-09 DIAGNOSIS — E785 Hyperlipidemia, unspecified: Secondary | ICD-10-CM

## 2012-12-09 DIAGNOSIS — R82998 Other abnormal findings in urine: Secondary | ICD-10-CM

## 2012-12-09 DIAGNOSIS — I10 Essential (primary) hypertension: Secondary | ICD-10-CM

## 2012-12-09 DIAGNOSIS — R209 Unspecified disturbances of skin sensation: Secondary | ICD-10-CM

## 2012-12-09 DIAGNOSIS — R8290 Unspecified abnormal findings in urine: Secondary | ICD-10-CM

## 2012-12-09 DIAGNOSIS — C61 Malignant neoplasm of prostate: Secondary | ICD-10-CM

## 2012-12-09 DIAGNOSIS — E119 Type 2 diabetes mellitus without complications: Secondary | ICD-10-CM

## 2012-12-09 DIAGNOSIS — R1310 Dysphagia, unspecified: Secondary | ICD-10-CM

## 2012-12-09 DIAGNOSIS — G4733 Obstructive sleep apnea (adult) (pediatric): Secondary | ICD-10-CM

## 2012-12-09 LAB — HEPATIC FUNCTION PANEL
ALT: 19 U/L (ref 0–53)
Alkaline Phosphatase: 61 U/L (ref 39–117)
Bilirubin, Direct: 0.1 mg/dL (ref 0.0–0.3)
Total Protein: 7.7 g/dL (ref 6.0–8.3)

## 2012-12-09 LAB — URINALYSIS, ROUTINE W REFLEX MICROSCOPIC
Bilirubin Urine: NEGATIVE
Ketones, ur: NEGATIVE
Leukocytes, UA: NEGATIVE
Total Protein, Urine: NEGATIVE
Urine Glucose: NEGATIVE
pH: 7 (ref 5.0–8.0)

## 2012-12-09 LAB — COMPREHENSIVE METABOLIC PANEL
ALT: 19 U/L (ref 0–53)
AST: 19 U/L (ref 0–37)
CO2: 29 mEq/L (ref 19–32)
GFR: 70.89 mL/min (ref >60.00)
Sodium: 137 mEq/L (ref 135–145)
Total Bilirubin: 0.5 mg/dL (ref 0.3–1.2)
Total Protein: 7.7 g/dL (ref 6.0–8.3)

## 2012-12-09 LAB — LIPID PANEL
HDL: 35.7 mg/dL — ABNORMAL LOW (ref >39.00)
Total CHOL/HDL Ratio: 5

## 2012-12-09 NOTE — Patient Instructions (Addendum)
1. Urinary frequency and urgency - question of after effects of treatment of prostate cancer vs overactive bladder.  Plan   will defer to Dr. Karsten Ro at this time  2. Obstructive sleep apnea and diabetes - OSA patients do have a higher incidence of diabetes but the reverse is not in the literature based on a brief search.,  3. Swallow - to determine the cause, i.e. The plate in your neck, will get a barium swallow which will show if there is an obstruction or external compression of the esophagus  4. Impotence - may be related to the prostate cancer treatment and/or the diabetes. Plan Dr. Karsten Ro may be able to help: intracavernous injections ( to the shaft) vs implant possibilities.  5. Diabetes - for follow up lab and will also check the cholesterol.   6. Numbness and tingling in the feet - since this is not better and may be worse will move ahead with nerve conduction study.

## 2012-12-09 NOTE — Progress Notes (Signed)
Subjective:    Patient ID: Antonio Adams, male    DOB: 10-07-1945, 67 y.o.   MRN: [id number]  HPI Mr. Reveal had agent orange related skin issues when [place] from [place] He still has a rash problems expecially in the warm weather.  He has been treated for prostate cancer and is now having a problem with bladder control, urinary frequency and poor control. His urologist is Dr. Karsten Ro. Question of post-seed implant condition vs OAB.  He has OSA and uses CPAP. He has annual titration studies. He also had cervical surgery. He is interested in the link between OSA and DM: several articles reviewed - there is an increased incidence of new on-set DM in people with OSA  But the reverse relationship has not been established.   He has positional SOB which he relates to some type of obstruction in his neck.  He reports that periodic CBGs are OK. Last A1C 6.3% Nov. '13  Past Medical History  Diagnosis Date  . HYPERLIPIDEMIA 03/16/2007    takes Crestor daily  . CARPAL TUNNEL SYNDROME, BILATERAL 03/16/2007  . ALLERGIC RHINITIS 03/16/2007  . BACK PAIN WITH RADICULOPATHY 11/23/2008  . LEG CRAMPS 05/22/2007  . PARESTHESIA 05/21/2007  . GASTROINTESTINAL HEMORRHAGE, HX OF 03/16/2007  . Overactive bladder   . Hemorrhoids   . ADENOCARCINOMA, PROSTATE, GLEASON GRADE 6 12/21/2009  . HYPERTENSION 03/16/2007    takes Diltiazem and Lisinopril daily   . Sleep apnea   . Chronic back pain   . Arthritis     back   . GERD 03/16/2007    takes Omeprazole daily  . Rectal bleeding     Dr.Norins has explained its from the Radiation that he has received  . History of colon polyps   . Urinary frequency     takes Toviaz daily  . Nocturia   . DIABETES MELLITUS, TYPE II 03/16/2007    takes Januvia and MEtformin daily  . Constipation     takes Senokot daily  . Glaucoma     mild  . Depression     occasionally  . Proctitis    Past Surgical History  Procedure Laterality Date  . Back surgery  1983, 2008     repeat surgery; ESI '08  . Shoulder surgery  LFT  . Carpal tunnel release      bilateral  . Stress cardiolite  12/24/2003    negative for ischemia  . Wound exploration      management of a wound that he sustained in the [employer]  . Prostate surgery  march 2012    seed implant  . Cervical fusion    . Lumbar laminectomy  10/30/2011    Procedure: MICRODISCECTOMY LUMBAR LAMINECTOMY;  Surgeon: Jessy Oto, MD;  Location: [place];  Service: Orthopedics;  Laterality: Right;  Right L4-5 and L5-S1 Microdiscectomy  . Colonosocpy    . Esophagogastroduodenoscopy      ED  . Lumbar laminectomy  06/17/2012    Procedure: MICRODISCECTOMY LUMBAR LAMINECTOMY;  Surgeon: Jessy Oto, MD;  Location: [place];  Service: Orthopedics;  Laterality: N/A;  Right L5-S1 microdiscectomy   Family History  Problem Relation Age of Onset  . Cancer Sister 30    Cancer  . Hypertension Other   . Colon cancer Neg Hx   . Esophageal cancer Neg Hx   . Stomach cancer Neg Hx    History   Social History  . Marital Status: Married    Spouse Name: N/A  Number of Children: 2  . Years of Education: 12+   Occupational History  . Event organiser; Curator     retired from Event organiser   Social History Main Topics  . Smoking status: Never Smoker   . Smokeless tobacco: Never Used  . Alcohol Use: No  . Drug Use: No  . Sexually Active: Yes -- Male partner(s)   Other Topics Concern  . Not on file   Social History Narrative   Married-divorced '89, remarried '96. 2 sons, 1 daughter. 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 He wishes to be a full resuscitation candidate.       Daily caffeine     Current Outpatient Prescriptions on File Prior to Visit  Medication Sig Dispense Refill  . aspirin 325 MG tablet Take 325 mg by mouth daily.        . bisacodyl (DULCOLAX) 5 MG EC tablet Take 1 tablet (5 mg total) by mouth daily as needed.  30 tablet  1  .  Cholecalciferol 1000 UNITS capsule Take 1,000 Units by mouth daily.       . ciprofloxacin (CIPRO) 250 MG tablet Take 250 mg by mouth 2 (two) times daily.      Marland Kitchen diltiazem (DILACOR XR) 120 MG 24 hr capsule Take 120 mg by mouth daily.        Marland Kitchen gabapentin (NEURONTIN) 100 MG capsule Take 100 mg by mouth at bedtime.      Marland Kitchen gemfibrozil (LOPID) 600 MG tablet Take 600 mg by mouth 2 (two) times daily.       Marland Kitchen glucose blood (ONE TOUCH ULTRA TEST) test strip 1 each by Other route 2 (two) times daily. And lancets 2/day 250.03  100 each  12  . ibuprofen (ADVIL,MOTRIN) 800 MG tablet Take 800 mg by mouth as needed.       Marland Kitchen JANUVIA 100 MG tablet TAKE 1 TABLET BY MOUTH EVERY DAY  30 tablet  5  . lisinopril (PRINIVIL,ZESTRIL) 40 MG tablet Take 20 mg by mouth daily.       . metFORMIN (GLUCOPHAGE) 1000 MG tablet Take 1 tablet (1,000 mg total) by mouth 2 (two) times daily with a meal.  60 tablet  1  . Multiple Vitamin (MULTIVITAMIN) tablet Take 1 tablet by mouth daily.        Marland Kitchen zolpidem (AMBIEN) 10 MG tablet Take 10 mg by mouth at bedtime as needed.        No current facility-administered medications on file prior to visit.    Review of Systems System review is negative for any constitutional, cardiac, pulmonary, GI or neuro symptoms or complaints other than as described in the HPI.     Objective:   Physical Exam Filed Vitals:   12/09/12 1038  BP: 142/74  Pulse: 72  Temp: 98.5 F (36.9 C)   gen'l - WNWD AA man in no distress HEENT - Adams/AT, C&S clear Cor 2+ radial pulse, regular Pulm  normal respirations Neuro - A&O x 3, normal strength, normal gait.       Assessment & Plan:  Dysphagia - he c/o sensation of obstruction with swallow. No report of literal obstruction or having to regurgitate. He is concerned about extrinsic obstruction  Plan BaSwallow

## 2012-12-10 NOTE — Assessment & Plan Note (Signed)
Continued paresthesia both feet. Most likely DM related but cannot rule out lumbar origin  Plan  NCS

## 2012-12-10 NOTE — Assessment & Plan Note (Signed)
Tolerating CPAP. Discussed relationship of OSA to diabetes.  PLan Continue present treatment

## 2012-12-10 NOTE — Assessment & Plan Note (Signed)
BP Readings from Last 3 Encounters:  12/09/12 142/74  10/23/12 132/86  07/10/12 128/86   adequate control on present medication.

## 2012-12-10 NOTE — Assessment & Plan Note (Signed)
Patient s/p XRT and brachytherapy now presenting with c/o urinary frequency, incontinence related to urgency. He is also c/o inability to attain erection. Suspect he may have OAB vs radiation related incontnence. Impotence may be related to hormonal therapy.  Plan He has contacted Dr. Karsten Ro in regard to urinary issues and will defer to him at this time  He is advised to talk with Dr. Karsten Ro about treatment including possible cavernous injection vs implant.

## 2012-12-10 NOTE — Assessment & Plan Note (Signed)
Lab Results  Component Value Date   HGBA1C 7.2* 12/09/2012   Adequate control on present medication. No changes

## 2012-12-10 NOTE — Assessment & Plan Note (Signed)
LDL  136, above goal of 100 or less.  Plan Will need to discuss additional treatment at return OV

## 2012-12-18 ENCOUNTER — Other Ambulatory Visit: Payer: Self-pay

## 2012-12-27 NOTE — Addendum Note (Signed)
Addended by: Estell Harpin T on: 12/27/2012 09:30 AM   Modules accepted: Orders

## 2012-12-31 ENCOUNTER — Ambulatory Visit (HOSPITAL_COMMUNITY)
Admission: RE | Admit: 2012-12-31 | Discharge: 2012-12-31 | Disposition: A | Discharge status: Home / Self Care | Payer: Medicare Other | Source: Ambulatory Visit | Attending: Internal Medicine | Admitting: Internal Medicine

## 2012-12-31 DIAGNOSIS — R1310 Dysphagia, unspecified: Secondary | ICD-10-CM | POA: Insufficient documentation

## 2012-12-31 DIAGNOSIS — K219 Gastro-esophageal reflux disease without esophagitis: Secondary | ICD-10-CM | POA: Insufficient documentation

## 2013-01-14 ENCOUNTER — Other Ambulatory Visit: Payer: Self-pay

## 2013-01-14 MED ORDER — CLOTRIMAZOLE-BETAMETHASONE 1-0.05 % EX CREA: TOPICAL_CREAM | Freq: Two times a day (BID) | CUTANEOUS | Status: DC

## 2013-01-14 NOTE — Telephone Encounter (Signed)
Patient walked in to the office today requesting this to be refilled.

## 2013-01-20 ENCOUNTER — Ambulatory Visit: Payer: Self-pay | Admitting: Neurology

## 2013-01-31 ENCOUNTER — Ambulatory Visit (INDEPENDENT_AMBULATORY_CARE_PROVIDER_SITE_OTHER): Payer: Medicare Other | Admitting: Neurology

## 2013-01-31 ENCOUNTER — Encounter: Payer: Self-pay | Admitting: Neurology

## 2013-01-31 VITALS — BP 131/80 | HR 92 | Ht 71.0 in | Wt 239.0 lb

## 2013-01-31 DIAGNOSIS — E785 Hyperlipidemia, unspecified: Secondary | ICD-10-CM

## 2013-01-31 DIAGNOSIS — IMO0002 Reserved for concepts with insufficient information to code with codable children: Secondary | ICD-10-CM

## 2013-01-31 DIAGNOSIS — G5600 Carpal tunnel syndrome, unspecified upper limb: Secondary | ICD-10-CM

## 2013-01-31 DIAGNOSIS — M5416 Radiculopathy, lumbar region: Secondary | ICD-10-CM

## 2013-01-31 DIAGNOSIS — E119 Type 2 diabetes mellitus without complications: Secondary | ICD-10-CM

## 2013-01-31 NOTE — Progress Notes (Signed)
[facility]  PATIENT: Antonio Adams DOB: 14-Mar-1946  HISTORICAL  Antonio Adams is a 67 yo RH AAM was referred by his Primary Care physician Dr. Lewanda Rife for evaluation of right foot numbness.  He had history of diabetes, depression, hypertension, chronic low back pain,  He fell backwards in 1980s, suffered severe low back pain at that time, it was radiating down his right leg, he was diagnosed with right lumbar radiculopathy, had a decompression surgery twice which did help his low back pain  He began to have recurrent low back pain again after carrying heavy object, getting worse in 2013, low back pain radiating down the right lateral thigh, lateral leg, involving right plantar surface, in June 2013 he underwent microdiscectomy lumbar laminectomy of right L5-S1, L4-5 by Dr. 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   REVIEW OF SYSTEMS: Full 14 system review of systems performed and notable only for trouble swallowing, itching, blood in stool, urination problem, cramps, memory loss, confusion, numbness, difficulty swallowing, depression, change in appetite, suicidal thought  ALLERGIES: No Known  Allergies  HOME MEDICATIONS: Outpatient Prescriptions Prior to Visit  Medication Sig Dispense Refill  . aspirin 325 MG tablet Take 325 mg by mouth daily.        . bisacodyl (DULCOLAX) 5 MG EC tablet Take 1 tablet (5 mg total) by mouth daily as needed.  30 tablet  1  . Cholecalciferol 1000 UNITS capsule Take 1,000 Units by mouth daily.       . ciprofloxacin (CIPRO) 250 MG tablet Take 250 mg by mouth 2 (two) times daily.      . clotrimazole-betamethasone (LOTRISONE) cream Apply topically 2 (two) times daily.  45 g  0  . diltiazem (DILACOR XR) 120 MG 24 hr capsule Take 120 mg by mouth daily.        Marland Kitchen gabapentin (NEURONTIN) 100 MG capsule Take 100 mg by mouth at bedtime.      Marland Kitchen gemfibrozil (LOPID) 600 MG tablet Take 600 mg by mouth 2 (two) times daily.       Marland Kitchen glucose blood (ONE TOUCH ULTRA TEST) test strip 1 each by Other route 2 (two) times daily. And lancets 2/day 250.03  100 each  12  . ibuprofen (ADVIL,MOTRIN) 800 MG tablet Take 800 mg by mouth as needed.       Marland Kitchen JANUVIA 100 MG tablet TAKE 1 TABLET BY MOUTH EVERY DAY  30 tablet  5  . lisinopril (PRINIVIL,ZESTRIL) 40 MG tablet Take 20 mg by mouth daily.       . metFORMIN (GLUCOPHAGE) 1000 MG tablet Take 1 tablet (1,000 mg total) by mouth 2 (two) times daily with a meal.  60 tablet  1  . Multiple Vitamin (MULTIVITAMIN) tablet Take 1 tablet by mouth  daily.        . zolpidem (AMBIEN) 10 MG tablet Take 10 mg by mouth at bedtime as needed.        No facility-administered medications prior to visit.    PAST MEDICAL HISTORY: Past Medical History  Diagnosis Date  . HYPERLIPIDEMIA 03/16/2007  . CARPAL TUNNEL SYNDROME, BILATERAL 03/16/2007  . ALLERGIC RHINITIS 03/16/2007  . BACK PAIN WITH RADICULOPATHY 11/23/2008  . LEG CRAMPS 05/22/2007  . PARESTHESIA 05/21/2007  . GASTROINTESTINAL HEMORRHAGE, HX OF 03/16/2007  . Overactive bladder   . Hemorrhoids   . ADENOCARCINOMA, PROSTATE, GLEASON GRADE 6 12/21/2009  . HYPERTENSION 03/16/2007  . Sleep apnea,  CPAP since 2012   . Chronic back pain   . Arthritis   . GERD 03/16/2007  . Rectal bleeding     Dr.Norins has explained its from the Radiation that he has received  . History of colon polyps   . Urinary frequency   . Nocturia   . DIABETES MELLITUS, TYPE II 03/16/2007    takes Januvia and MEtformin daily  . Constipation     takes Senokot daily  . Glaucoma     mild  . Depression     occasionally  . Proctitis     PAST SURGICAL HISTORY: Past Surgical History  Procedure Laterality Date  . Back surgery  1983, 2008    repeat surgery; ESI '08  . Shoulder surgery  LFT  . Carpal tunnel release      bilateral  . Stress cardiolite  12/24/2003    negative for ischemia  . Wound exploration      management of a wound that he sustained in the [employer]  . Prostate surgery  march 2012    seed implant  . Cervical fusion    . Lumbar laminectomy  10/30/2011    Procedure: MICRODISCECTOMY LUMBAR LAMINECTOMY;  Surgeon: Jessy Oto, MD;  Location: [place];  Service: Orthopedics;  Laterality: Right;  Right L4-5 and L5-S1 Microdiscectomy  . Colonosocpy    . Esophagogastroduodenoscopy      ED  . Lumbar laminectomy  06/17/2012    Procedure: MICRODISCECTOMY LUMBAR LAMINECTOMY;  Surgeon: Jessy Oto, MD;  Location: [place];  Service: Orthopedics;  Laterality: N/A;  Right L5-S1 microdiscectomy    FAMILY HISTORY: Family History  Problem Relation Age of Onset  . Cancer Sister 30    Cancer  . Hypertension Other   . Colon cancer Neg Hx   . Esophageal cancer Neg Hx   . Stomach cancer Neg Hx     SOCIAL HISTORY:  History   Social History  . Marital Status: Married    Spouse Name: Deneise Lever    Number of Children: 3  . Years of Education: 12+   Occupational History  . Event organiser; Curator     retired from Event organiser   Social History Main Topics  . Smoking status: Never Smoker   . Smokeless tobacco: Never Used  . Alcohol Use: No  . Drug Use: No  . Sexual Activity: Yes    Partners:  Female    Social History Narrative   Married-divorced '89, remarried '96. 2 sons, 1 daughter. 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 He wishes to be a full resuscitation candidate.       Daily caffeine - one or two cups daily   Right handed.     PHYSICAL EXAM   Filed Vitals:   01/31/13 1015  BP:  131/80  Pulse: 92  Height: 5\' 11"  (1.803 m)  Weight: 239 lb (108.41 kg)    Body mass index is 33.35 kg/(m^2).   Generalized: In no acute distress  Neck: Supple, no carotid bruits   Cardiac: Regular rate rhythm  Pulmonary: Clear to auscultation bilaterally  Musculoskeletal: No deformity  Neurological examination  Mentation: Alert oriented to time, place, history taking, and causual conversation  Cranial nerve II-XII: Pupils were equal round reactive to light extraocular movements were full, visual field were full on confrontational test. facial sensation and strength were normal. hearing was intact to finger rubbing bilaterally. Uvula tongue midline.  head turning and shoulder shrug and were normal and symmetric.Tongue protrusion into cheek strength was normal.  Motor: normal tone, bulk and strength, with exception of right toe flexion/extension weakness 4+/5.  Sensory: Intact to fine touch, pinprick, preserved vibratory sensation, and proprioception at toes, with exception of decreased light touch at right lateral foot, right plantar surface.  Coordination: Normal finger to nose, heel-to-shin bilaterally there was no truncal ataxia  Gait: Rising up from seated position without assistance, normal stance, without trunk ataxia, moderate stride, good arm swing, smooth turning, he has difficulty performing right tiptoe, and heel walking.   Romberg signs: Negative  Deep tendon reflexes: Brachioradialis 2/2, biceps 2/2, triceps 2/2, patellar 2/2, Achilles 1/1, plantar responses were flexor bilaterally.   DIAGNOSTIC DATA  (LABS, IMAGING, TESTING) - I reviewed patient records, labs, notes, testing and imaging myself where available.  Lab Results  Component Value Date   WBC 4.8 06/17/2012   HGB 13.0 06/17/2012   HCT 38.1* 06/17/2012   MCV 81.4 06/17/2012   PLT 246 06/17/2012      Component Value Date/Time   NA 137 12/09/2012 1147   K 4.1 12/09/2012 1147   CL 103 12/09/2012 1147   CO2 29 12/09/2012 1147   GLUCOSE 149* 12/09/2012 1147   BUN 21 12/09/2012 1147   CREATININE 1.3 12/09/2012 1147   CALCIUM 10.5 12/09/2012 1147   PROT 7.7 12/09/2012 1147   PROT 7.7 12/09/2012 1147   ALBUMIN 4.1 12/09/2012 1147   ALBUMIN 4.1 12/09/2012 1147   AST 19 12/09/2012 1147   AST 19 12/09/2012 1147   ALT 19 12/09/2012 1147   ALT 19 12/09/2012 1147   ALKPHOS 61 12/09/2012 1147   ALKPHOS 61 12/09/2012 1147   BILITOT 0.5 12/09/2012 1147   BILITOT 0.5 12/09/2012 1147   GFRNONAA 56* 06/13/2012 1038   GFRAA 64* 06/13/2012 1038   Lab Results  Component Value Date   CHOL 181 12/09/2012   HDL 35.70* 12/09/2012   LDLCALC 136* 12/09/2012   TRIG 46.0 12/09/2012   CHOLHDL 5 12/09/2012   Lab Results  Component Value Date   HGBA1C 7.2* 12/09/2012   No results found for this basename: VITAMINB12   Lab Results  Component Value Date   TSH 0.85 11/11/2010    ASSESSMENT AND PLAN   67 year old right-handed African American male, with past medical history of right lumbar radiculopathy, post right L4-L5, L5-S1 micro-dissectomy lumbar laminectomy, with persistent right lateral, and plantar foot numbness, mild right plantar flexion, and extension weakness, most consistent with a right lumbosacral radiculopathy involving right L5, S1.  Complete evaluation with EMG nerve conduction study, this is more than 6 months after symptoms onset, more than likely, he will continue to be symptomatic.  Antonio Adams, M.D. Ph.D.  [facility] [address] [place], [place] [address] [phone]

## 2013-02-11 ENCOUNTER — Ambulatory Visit (INDEPENDENT_AMBULATORY_CARE_PROVIDER_SITE_OTHER): Payer: Medicare Other | Admitting: Neurology

## 2013-02-11 ENCOUNTER — Ambulatory Visit (INDEPENDENT_AMBULATORY_CARE_PROVIDER_SITE_OTHER): Payer: Self-pay | Admitting: Radiology

## 2013-02-11 DIAGNOSIS — R209 Unspecified disturbances of skin sensation: Secondary | ICD-10-CM

## 2013-02-11 DIAGNOSIS — M48061 Spinal stenosis, lumbar region without neurogenic claudication: Secondary | ICD-10-CM

## 2013-02-11 DIAGNOSIS — IMO0002 Reserved for concepts with insufficient information to code with codable children: Secondary | ICD-10-CM

## 2013-02-11 NOTE — Procedures (Signed)
    [facility]  NCS (NERVE CONDUCTION STUDY) WITH EMG (ELECTROMYOGRAPHY) REPORT   STUDY DATE: 02/11/2013 PATIENT NAME: Antonio Adams DOB: 04/24/1946 MRN: [id number]    TECHNOLOGIST: Towana Badger ELECTROMYOGRAPHER: Marcial Pacas M.D.  CLINICAL INFORMATION:  67 year old gentleman with chronic low back pain, multiple lumbar decompression surgery, now presenting with his right foot numbness, involving right plantar surface, lateral foot,  FINDINGS: NERVE CONDUCTION STUDY: Bilateral peroneal, sural sensory responses were normal. Bilateral peroneal to EDB, and tibial motor responses were normal  NEEDLE ELECTROMYOGRAPHY: Selected needle examination was performed at right lower extremity muscles and right lumbosacral paraspinal muscles.  Right tibialis anterior, normally insertion activity, no spontaneous activity, slightly enlarged motor unit potential, with mildly decreased recruitment patterns.  Needle examination of right  tibialis posterior, medial gastrocnemius, vastus lateralis, biceps femoris long head was normal.  There was no spontaneous activity at the right lumbosacral paraspinal muscles, right L4, L5, S1  IMPRESSION:   This is a mild abnormal study, there is electrodiagnostic evidence of mild chronic neuropathic changes involving right L5 myotomes, consistent with mild right lumbar radiculopathy. There was no evidence of large fiber peripheral neuropathy  PHYSICIAN:   Marcial Pacas M.D. Ph.D. [facility] [address] [place], [place] [address] [phone]

## 2013-02-24 ENCOUNTER — Other Ambulatory Visit: Payer: Self-pay | Admitting: Internal Medicine

## 2013-03-04 ENCOUNTER — Other Ambulatory Visit: Payer: Self-pay | Admitting: Internal Medicine

## 2013-03-04 ENCOUNTER — Encounter: Payer: Self-pay | Admitting: Internal Medicine

## 2013-03-04 DIAGNOSIS — R350 Frequency of micturition: Secondary | ICD-10-CM

## 2013-03-05 ENCOUNTER — Other Ambulatory Visit: Payer: Self-pay | Admitting: Internal Medicine

## 2013-03-06 ENCOUNTER — Other Ambulatory Visit: Payer: Medicare Other

## 2013-03-06 DIAGNOSIS — R350 Frequency of micturition: Secondary | ICD-10-CM

## 2013-03-06 LAB — URINALYSIS, ROUTINE W REFLEX MICROSCOPIC
Bilirubin Urine: NEGATIVE
Hgb urine dipstick: NEGATIVE
Ketones, ur: NEGATIVE
Total Protein, Urine: NEGATIVE
Urine Glucose: NEGATIVE

## 2013-03-06 MED ORDER — TAMSULOSIN HCL 0.4 MG PO CAPS: 0.4000 mg | ORAL_CAPSULE | Freq: Every day | ORAL | Status: DC

## 2013-03-20 ENCOUNTER — Other Ambulatory Visit: Payer: Self-pay

## 2013-03-26 ENCOUNTER — Telehealth: Payer: Self-pay | Admitting: Internal Medicine

## 2013-03-26 NOTE — Telephone Encounter (Signed)
Patient is requesting a replacement for Januvia b/c the cost is too expensive.  Patient states that he can be reached through mychart or by cell phone.

## 2013-03-26 NOTE — Telephone Encounter (Signed)
All drugs in this class are comparable in price. Can switch to generic Actos 15 mg once a day - but he should check the cost before he switches

## 2013-03-27 NOTE — Telephone Encounter (Signed)
Left message to return call 

## 2013-03-28 MED ORDER — PIOGLITAZONE HCL 15 MG PO TABS: 15.0000 mg | ORAL_TABLET | Freq: Every day | ORAL | Status: DC

## 2013-03-28 NOTE — Telephone Encounter (Signed)
Patient called back and I advised him of MD message. He wants the Actos (generic) to be sent to his pharmacy, [place].

## 2013-03-28 NOTE — Telephone Encounter (Signed)
Left message for patient to return call.

## 2013-05-20 ENCOUNTER — Other Ambulatory Visit: Payer: Self-pay | Admitting: Orthopedic Surgery

## 2013-05-20 DIAGNOSIS — M25512 Pain in left shoulder: Secondary | ICD-10-CM

## 2013-05-23 ENCOUNTER — Ambulatory Visit
Admission: RE | Admit: 2013-05-23 | Discharge: 2013-05-23 | Disposition: A | Discharge status: Home / Self Care | Payer: Medicare Other | Source: Ambulatory Visit | Attending: Orthopedic Surgery | Admitting: Orthopedic Surgery

## 2013-05-23 DIAGNOSIS — M25512 Pain in left shoulder: Secondary | ICD-10-CM

## 2013-06-09 ENCOUNTER — Ambulatory Visit (INDEPENDENT_AMBULATORY_CARE_PROVIDER_SITE_OTHER): Payer: Medicare Other | Admitting: Internal Medicine

## 2013-06-09 ENCOUNTER — Encounter: Payer: Self-pay | Admitting: Internal Medicine

## 2013-06-09 VITALS — BP 126/80 | HR 77 | Temp 98.6°F | Wt 244.0 lb

## 2013-06-09 DIAGNOSIS — E119 Type 2 diabetes mellitus without complications: Secondary | ICD-10-CM

## 2013-06-09 DIAGNOSIS — R252 Cramp and spasm: Secondary | ICD-10-CM

## 2013-06-09 DIAGNOSIS — I10 Essential (primary) hypertension: Secondary | ICD-10-CM

## 2013-06-09 DIAGNOSIS — K219 Gastro-esophageal reflux disease without esophagitis: Secondary | ICD-10-CM

## 2013-06-09 DIAGNOSIS — E785 Hyperlipidemia, unspecified: Secondary | ICD-10-CM

## 2013-06-09 MED ORDER — GABAPENTIN 100 MG PO CAPS: 100.0000 mg | ORAL_CAPSULE | Freq: Every day | ORAL | Status: DC

## 2013-06-09 NOTE — Patient Instructions (Signed)
Good to see you. Have reviewed all your medications and they all make sense.  Nothing to discontineu  Leg cramps - check to see if you have been taking gabapentin 100 mg at bedtime. If not - restart, if so - increase to 200 mg (2 tablet) at bedtime. Will check a magnesium level. You can also get cinchona supplements at the health food section.  Blood pressure - good control on you current medications  Dysphagia - not swallowing well. Very likely to be a early esophageal stricture from acid damage. Need to see Dr. Henrene Pastor for evaluation of possible stricture or Barrett's esophagus - a premalignant change. An appointment will be made for you with Dr. Henrene Pastor.  Thanks for letting me be your doctor, since the last century. You will get great care from my partners. If not call me up.

## 2013-06-09 NOTE — Progress Notes (Signed)
Subjective:    Patient ID: Antonio Adams, male    DOB: [birth year], 68 y.o.   MRN: [id number]  HPI [name] presents for follow up of HTN. He is also having trouble with leg cramps at night and in the early morning. Can be either leg. Cramping is a long term problem: he has taken tonic water, he has been prescribed gabapentin. He also c/o GERD and mild-moderate dysphagia.  Past Medical History  Diagnosis Date  . HYPERLIPIDEMIA 03/16/2007    takes Crestor daily  . CARPAL TUNNEL SYNDROME, BILATERAL 03/16/2007  . ALLERGIC RHINITIS 03/16/2007  . BACK PAIN WITH RADICULOPATHY 11/23/2008  . LEG CRAMPS 05/22/2007  . PARESTHESIA 05/21/2007  . GASTROINTESTINAL HEMORRHAGE, HX OF 03/16/2007  . Overactive bladder   . Hemorrhoids   . ADENOCARCINOMA, PROSTATE, GLEASON GRADE 6 12/21/2009  . HYPERTENSION 03/16/2007    takes Diltiazem and Lisinopril daily   . Sleep apnea   . Chronic back pain   . Arthritis     back   . GERD 03/16/2007    takes Omeprazole daily  . Rectal bleeding     Dr.Aaralynn Shepheard has explained its from the Radiation that he has received  . History of colon polyps   . Urinary frequency     takes Toviaz daily  . Nocturia   . DIABETES MELLITUS, TYPE II 03/16/2007    takes Januvia and MEtformin daily  . Constipation     takes Senokot daily  . Glaucoma     mild  . Depression     occasionally  . Proctitis    Past Surgical History  Procedure Laterality Date  . Back surgery  1983, 2008    repeat surgery; ESI '08  . Shoulder surgery  LFT  . Carpal tunnel release      bilateral  . Stress cardiolite  12/24/2003    negative for ischemia  . Wound exploration      management of a wound that he sustained in the [employer]  . Prostate surgery  march 2012    seed implant  . Cervical fusion    . Lumbar laminectomy  10/30/2011    Procedure: MICRODISCECTOMY LUMBAR LAMINECTOMY;  Surgeon: Jessy Oto, MD;  Location: [facility];  Service: Orthopedics;  Laterality: Right;  Right L4-5 and L5-S1  Microdiscectomy  . Colonosocpy    . Esophagogastroduodenoscopy      ED  . Lumbar laminectomy  06/17/2012    Procedure: MICRODISCECTOMY LUMBAR LAMINECTOMY;  Surgeon: Jessy Oto, MD;  Location: [facility];  Service: Orthopedics;  Laterality: N/A;  Right L5-S1 microdiscectomy   Family History  Problem Relation Age of Onset  . Cancer Sister 30    Cancer  . Hypertension Other   . Colon cancer Neg Hx   . Esophageal cancer Neg Hx   . Stomach cancer Neg Hx    History   Social History  . Marital Status: Married    Spouse Name: Deneise Lever    Number of Children: 2  . Years of Education: 12+   Occupational History  . Event organiser; Curator     retired from Event organiser   Social History Main Topics  . Smoking status: Never Smoker   . Smokeless tobacco: Never Used  . Alcohol Use: No  . Drug Use: No  . Sexual Activity: Yes    Partners: Female   Other Topics Concern  . Not on file   Social History Narrative   Married-divorced '89, remarried '96. 2 sons, 1  daughter. 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 He wishes to be a full resuscitation candidate.       Daily caffeine - one or two cups daily   Right handed.     Current Outpatient Prescriptions on File Prior to Visit  Medication Sig Dispense Refill  . aspirin 325 MG tablet Take 325 mg by mouth daily.        . Cholecalciferol 1000 UNITS capsule Take 1,000 Units by mouth daily.       . clotrimazole-betamethasone (LOTRISONE) cream Apply topically 2 (two) times daily.  45 g  0  . diltiazem (DILACOR XR) 120 MG 24 hr capsule Take 120 mg by mouth daily.        Marland Kitchen gemfibrozil (LOPID) 600 MG tablet Take 600 mg by mouth 2 (two) times daily.       Marland Kitchen glucose blood (ONE TOUCH ULTRA TEST) test strip 1 each by Other route 2 (two) times daily. And lancets 2/day 250.03  100 each  12  . HYDROcodone-acetaminophen (NORCO/VICODIN) 5-325 MG per tablet       . ibuprofen (ADVIL,MOTRIN) 800 MG  tablet Take 800 mg by mouth as needed.       Marland Kitchen lisinopril (PRINIVIL,ZESTRIL) 40 MG tablet Take 20 mg by mouth daily.       . metFORMIN (GLUCOPHAGE) 1000 MG tablet Take 1 tablet (1,000 mg total) by mouth 2 (two) times daily with a meal.  60 tablet  1  . Multiple Vitamin (MULTIVITAMIN) tablet Take 1 tablet by mouth daily.        Marland Kitchen omeprazole (PRILOSEC) 40 MG capsule TAKE ONE CAPSULE EVERY DAY  30 capsule  10  . pioglitazone (ACTOS) 15 MG tablet Take 1 tablet (15 mg total) by mouth daily. Okay for generic  30 tablet  5  . sertraline (ZOLOFT) 100 MG tablet Take 100 tablets by mouth daily.      Marland Kitchen zolpidem (AMBIEN) 10 MG tablet Take 10 mg by mouth at bedtime as needed.        No current facility-administered medications on file prior to visit.       Review of Systems System review is negative for any constitutional, cardiac, pulmonary, GI or neuro symptoms or complaints other than as described in the HPI.     Objective:   Physical Exam Filed Vitals:   06/09/13 1529  BP: 126/80  Pulse: 77  Temp: 98.6 F (37 C)   Wt Readings from Last 3 Encounters:  06/09/13 244 lb (110.678 kg)  01/31/13 239 lb (108.41 kg)  12/09/12 241 lb 12.8 oz (109.68 kg)   BP Readings from Last 3 Encounters:  06/09/13 126/80  01/31/13 131/80  12/09/12 142/74   Gen'l- heavyset man in no acute distress HEENT- C&S clear Cor-2+radial, RRR Pulm - normal respirations Neuro - normal       Assessment & Plan:

## 2013-06-09 NOTE — Progress Notes (Signed)
Pre visit review using our clinic review tool, if applicable. No additional management support is needed unless otherwise documented below in the visit note. 

## 2013-06-11 ENCOUNTER — Other Ambulatory Visit (INDEPENDENT_AMBULATORY_CARE_PROVIDER_SITE_OTHER): Payer: Medicare Other

## 2013-06-11 DIAGNOSIS — E785 Hyperlipidemia, unspecified: Secondary | ICD-10-CM

## 2013-06-11 DIAGNOSIS — E119 Type 2 diabetes mellitus without complications: Secondary | ICD-10-CM

## 2013-06-11 DIAGNOSIS — R252 Cramp and spasm: Secondary | ICD-10-CM

## 2013-06-11 DIAGNOSIS — I10 Essential (primary) hypertension: Secondary | ICD-10-CM

## 2013-06-11 LAB — COMPREHENSIVE METABOLIC PANEL
ALT: 19 U/L (ref 0–53)
AST: 16 U/L (ref 0–37)
Albumin: 3.8 g/dL (ref 3.5–5.2)
Alkaline Phosphatase: 59 U/L (ref 39–117)
BUN: 26 mg/dL — ABNORMAL HIGH (ref 6–23)
CHLORIDE: 107 meq/L (ref 96–112)
CO2: 23 meq/L (ref 19–32)
Calcium: 9.9 mg/dL (ref 8.4–10.5)
Creatinine, Ser: 1.3 mg/dL (ref 0.4–1.5)
GFR: 68.94 mL/min (ref >60.00)
Glucose, Bld: 101 mg/dL — ABNORMAL HIGH (ref 70–99)
Potassium: 4.5 mEq/L (ref 3.5–5.1)
Sodium: 136 mEq/L (ref 135–145)
TOTAL PROTEIN: 7.1 g/dL (ref 6.0–8.3)
Total Bilirubin: 0.4 mg/dL (ref 0.3–1.2)

## 2013-06-11 LAB — LIPID PANEL
CHOLESTEROL: 163 mg/dL (ref 0–200)
HDL: 37.2 mg/dL — ABNORMAL LOW (ref >39.00)
LDL CALC: 112 mg/dL — AB (ref 0–99)
TRIGLYCERIDES: 71 mg/dL (ref 0.0–149.0)
Total CHOL/HDL Ratio: 4
VLDL: 14.2 mg/dL (ref 0.0–40.0)

## 2013-06-11 LAB — MAGNESIUM: Magnesium: 1.7 mg/dL (ref 1.5–2.5)

## 2013-06-11 LAB — HEMOGLOBIN A1C: HEMOGLOBIN A1C: 7.7 % — AB (ref 4.6–6.5)

## 2013-06-11 NOTE — Assessment & Plan Note (Signed)
BP Readings from Last 3 Encounters:  06/09/13 126/80  01/31/13 131/80  12/09/12 142/74   BP is well controlled. Patient reassured.

## 2013-06-11 NOTE — Assessment & Plan Note (Signed)
Leg cramps - check to see if you have been taking gabapentin 100 mg at bedtime. If not - restart, if so - increase to 200 mg (2 tablet) at bedtime. Will check a magnesium level. You can also get cinchona supplements at the health food section.

## 2013-06-11 NOTE — Assessment & Plan Note (Signed)
Mr. Rygg c/o increased reflux and mild-moderated dysphagia - he has not had impaction or needed to regurgitate food. Discussed possibility of esophageal stricture and the risk of Barrett's.  Plan Refer to Dr. Henrene Pastor for evaluation.

## 2013-06-12 ENCOUNTER — Telehealth: Payer: Self-pay

## 2013-06-12 NOTE — Telephone Encounter (Signed)
Relevant patient education assigned to patient using Emmi. ° °

## 2013-07-02 ENCOUNTER — Ambulatory Visit (INDEPENDENT_AMBULATORY_CARE_PROVIDER_SITE_OTHER): Payer: Medicare Other | Admitting: Internal Medicine

## 2013-07-02 ENCOUNTER — Encounter: Payer: Self-pay | Admitting: Internal Medicine

## 2013-07-02 ENCOUNTER — Ambulatory Visit: Payer: Medicare Other | Admitting: Internal Medicine

## 2013-07-02 VITALS — BP 138/62 | HR 92 | Ht 70.25 in | Wt 243.0 lb

## 2013-07-02 DIAGNOSIS — R1310 Dysphagia, unspecified: Secondary | ICD-10-CM

## 2013-07-02 DIAGNOSIS — K219 Gastro-esophageal reflux disease without esophagitis: Secondary | ICD-10-CM

## 2013-07-02 DIAGNOSIS — E119 Type 2 diabetes mellitus without complications: Secondary | ICD-10-CM

## 2013-07-02 NOTE — Patient Instructions (Signed)
You have been scheduled for an endoscopy with propofol. Please follow written instructions given to you at your visit today. If you use inhalers (even only as needed), please bring them with you on the day of your procedure. Your physician has requested that you go to www.startemmi.com and enter the access code given to you at your visit today. This web site gives a general overview about your procedure. However, you should still follow specific instructions given to you by our office regarding your preparation for the procedure. 

## 2013-07-02 NOTE — Progress Notes (Signed)
HISTORY OF PRESENT ILLNESS:  Antonio Adams is a 68 y.o. male with diabetes mellitus, hypertension, hyperlipidemia, sleep apnea, prostate cancer status post seed implantation and external beam radiation, radiation proctitis diagnosed on colonoscopy June 2013, history of adenomatous colon polyps, history of small duodenal carcinoid diagnosed in 2002 and monitored regularly with documented stability, and GERD. He presents today regarding dysphagia. Patient points to the cervical esophagus. He has had prior cervical disc surgery with plate placement at [facility]. He reports that he has had this problem with intermittent solid food dysphagia for years. He did undergo a barium esophagram August 2014. No fixed stricture noted. Barium tablet passed without difficulty. He does take omeprazole 40 mg daily for GERD. Also complains of phlegm in his throat, particularly at night. Has complained of this previously last upper endoscopy August 2012. Normal with stable duodenal nodule biopsied and confirm low-grade carcinoid. Last colonoscopy June 2013 with radiation proctitis only  REVIEW OF SYSTEMS:  All non-GI ROS negative except for back pain, night sweats, sleeping problems, increased urination  Past Medical History  Diagnosis Date  . HYPERLIPIDEMIA 03/16/2007    takes Crestor daily  . CARPAL TUNNEL SYNDROME, BILATERAL 03/16/2007  . ALLERGIC RHINITIS 03/16/2007  . BACK PAIN WITH RADICULOPATHY 11/23/2008  . LEG CRAMPS 05/22/2007  . PARESTHESIA 05/21/2007  . GASTROINTESTINAL HEMORRHAGE, HX OF 03/16/2007  . Overactive bladder   . Hemorrhoids   . ADENOCARCINOMA, PROSTATE, GLEASON GRADE 6 12/21/2009  . HYPERTENSION 03/16/2007    takes Diltiazem and Lisinopril daily   . Sleep apnea   . Chronic back pain   . Arthritis     back   . GERD 03/16/2007    takes Omeprazole daily  . Rectal bleeding     Dr.Norins has explained its from the Radiation that he has received  . History of colon polyps   . Urinary frequency      takes Toviaz daily  . Nocturia   . DIABETES MELLITUS, TYPE II 03/16/2007    takes Januvia and MEtformin daily  . Constipation     takes Senokot daily  . Glaucoma     mild  . Depression     occasionally  . Proctitis     Past Surgical History  Procedure Laterality Date  . Back surgery  1983, 2008    repeat surgery; ESI '08  . Shoulder surgery  LFT  . Carpal tunnel release      bilateral  . Stress cardiolite  12/24/2003    negative for ischemia  . Wound exploration      management of a wound that he sustained in the [employer]  . Prostate surgery  march 2012    seed implant  . Cervical fusion    . Lumbar laminectomy  10/30/2011    Procedure: MICRODISCECTOMY LUMBAR LAMINECTOMY;  Surgeon: Jessy Oto, MD;  Location: [facility];  Service: Orthopedics;  Laterality: Right;  Right L4-5 and L5-S1 Microdiscectomy  . Colonosocpy    . Esophagogastroduodenoscopy      ED  . Lumbar laminectomy  06/17/2012    Procedure: MICRODISCECTOMY LUMBAR LAMINECTOMY;  Surgeon: Jessy Oto, MD;  Location: [facility];  Service: Orthopedics;  Laterality: N/A;  Right L5-S1 microdiscectomy    Social History PAULA ROSTEN  reports that he has never smoked. He has never used smokeless tobacco. He reports that he does not drink alcohol or use illicit drugs.  family history includes Cancer (age of onset: 29) in his sister; Hypertension in his other. There  is no history of Colon cancer, Esophageal cancer, or Stomach cancer.  No Known Allergies     PHYSICAL EXAMINATION: Vital signs: BP 138/62  Pulse 92  Ht 5' 10.25" (1.784 m)  Wt 243 lb (110.224 kg)  BMI 34.63 kg/m2 General: Well-developed, well-nourished, no acute distress HEENT: Sclerae are anicteric, conjunctiva pink. Oral mucosa intact Lungs: Clear Heart: Regular Abdomen: soft, nontender, nondistended, no obvious ascites, no peritoneal signs, normal bowel sounds. No organomegaly. Extremities: No edema Psychiatric: alert and oriented x3.  Cooperative   ASSESSMENT:  #1. Intermittent solid food dysphagia. Negative previous endoscopy and esophagram. Could be related to prior cervical surgery or subtle abnormality not appreciated on esophagram #2. History of duodenal carcinoid #3. History of adenomatous colon polyps #4. Radiation proctitis #5. Multiple general medical problems. Stable   PLAN:  #1. Upper endoscopy with possible empiric esophageal dilation.The nature of the procedure, as well as the risks, benefits, and alternatives were carefully and thoroughly reviewed with the patient. Ample time for discussion and questions allowed. The patient understood, was satisfied, and agreed to proceed. #2. Continue PPI #3. Surveillance colonoscopy around 2018 #4. Hold diabetic medications the morning of the procedure

## 2013-07-07 ENCOUNTER — Encounter: Payer: Self-pay | Admitting: Internal Medicine

## 2013-07-07 ENCOUNTER — Ambulatory Visit (AMBULATORY_SURGERY_CENTER): Payer: Medicare Other | Admitting: Internal Medicine

## 2013-07-07 VITALS — BP 150/89 | HR 63 | Temp 97.2°F | Resp 21 | Ht 70.25 in | Wt 243.0 lb

## 2013-07-07 DIAGNOSIS — D1330 Benign neoplasm of unspecified part of small intestine: Secondary | ICD-10-CM

## 2013-07-07 DIAGNOSIS — K219 Gastro-esophageal reflux disease without esophagitis: Secondary | ICD-10-CM

## 2013-07-07 DIAGNOSIS — D3A010 Benign carcinoid tumor of the duodenum: Secondary | ICD-10-CM

## 2013-07-07 DIAGNOSIS — R1310 Dysphagia, unspecified: Secondary | ICD-10-CM

## 2013-07-07 LAB — GLUCOSE, CAPILLARY
Glucose-Capillary: 100 mg/dL — ABNORMAL HIGH (ref 70–99)
Glucose-Capillary: 89 mg/dL (ref 70–99)

## 2013-07-07 MED ORDER — SODIUM CHLORIDE 0.9 % IV SOLN: 500.0000 mL | INTRAVENOUS | Status: DC

## 2013-07-07 NOTE — Progress Notes (Signed)
Called to room to assist during endoscopic procedure.  Patient ID and intended procedure confirmed with present staff. Received instructions for my participation in the procedure from the performing physician.  

## 2013-07-07 NOTE — Patient Instructions (Signed)
YOU HAD AN ENDOSCOPIC PROCEDURE TODAY AT [facility]: Refer to the procedure report that was given to you for any specific questions about what was found during the examination.  If the procedure report does not answer your questions, please call your gastroenterologist to clarify.  If you requested that your care partner not be given the details of your procedure findings, then the procedure report has been included in a sealed envelope for you to review at your convenience later.  YOU SHOULD EXPECT: Some feelings of bloating in the abdomen. Passage of more gas than usual.  Walking can help get rid of the air that was put into your GI tract during the procedure and reduce the bloating. If you had a lower endoscopy (such as a colonoscopy or flexible sigmoidoscopy) you may notice spotting of blood in your stool or on the toilet paper. If you underwent a bowel prep for your procedure, then you may not have a normal bowel movement for a few days.  DIET: FOLLOW DILATATION DIET TODAY   ACTIVITY: Your care partner should take you home directly after the procedure.  You should plan to take it easy, moving slowly for the rest of the day.  You can resume normal activity the day after the procedure however you should NOT DRIVE or use heavy machinery for 24 hours (because of the sedation medicines used during the test).    SYMPTOMS TO REPORT IMMEDIATELY: A gastroenterologist can be reached at any hour.  During normal business hours, 8:30 AM to 5:00 PM Monday through Friday, call [phone].  After hours and on weekends, please call the GI answering service at [phone] who will take a message and have the physician on call contact you.     Following upper endoscopy (EGD)  Vomiting of blood or coffee ground material  New chest pain or pain under the shoulder blades  Painful or persistently difficult swallowing  New shortness of breath  Fever of 100F or higher  Black, tarry-looking  stools  FOLLOW UP: If any biopsies were taken you will be contacted by phone or by letter within the next 1-3 weeks.  Call your gastroenterologist if you have not heard about the biopsies in 3 weeks.  Our staff will call the home number listed on your records the next business day following your procedure to check on you and address any questions or concerns that you may have at that time regarding the information given to you following your procedure. This is a courtesy call and so if there is no answer at the home number and we have not heard from you through the emergency physician on call, we will assume that you have returned to your regular daily activities without incident.  SIGNATURES/CONFIDENTIALITY: You and/or your care partner have signed paperwork which will be entered into your electronic medical record.  These signatures attest to the fact that that the information above on your After Visit Summary has been reviewed and is understood.  Full responsibility of the confidentiality of this discharge information lies with you and/or your care-partner.    Follow dilatation diet given to you today

## 2013-07-07 NOTE — Progress Notes (Signed)
Procedure ends, to recovery, report given and VSS. 

## 2013-07-07 NOTE — Op Note (Signed)
[facility]  [address] [place], [id number]   ENDOSCOPY PROCEDURE REPORT  PATIENT: Antonio Adams, Antonio Adams  MR#: [id number] BIRTHDATE: 1946-04-09 , 4  yrs. old GENDER: Male ENDOSCOPIST: Eustace Quail, MD REFERRED BY:  .  Self / Office PROCEDURE DATE:  07/07/2013 PROCEDURE:  EGD w/ biopsy and Maloney dilation of esophagus -34 French ASA CLASS:     Class II INDICATIONS:  Dysphagia. MEDICATIONS: MAC sedation, administered by CRNA and propofol (Diprivan) 220mg  IV TOPICAL ANESTHETIC: none  DESCRIPTION OF PROCEDURE: After the risks benefits and alternatives of the procedure were thoroughly explained, informed consent was obtained.  The LB [id number] endoscope was introduced through the mouth and advanced to the second portion of the duodenum. Without limitations.  The instrument was slowly withdrawn as the mucosa was fully examined.      EXAM:The esophagus appeared normal throughout.  No obvious stricture.  The stomach was normal.  The duodenal bulb revealed 14 mm submucosal nodule unchanged from previous exam.  Biopsied.  The post bulbar duodenum was normal.  Retroflexed views revealed a hiatal hernia.     The scope was then withdrawn from the patient and the procedure completed. THERAPY: 57 French Maloney dilator was passed into the esophagus without resistance or heme. Tolerated well  COMPLICATIONS: There were no complications. ENDOSCOPIC IMPRESSION: 1. Intermittent solid food dysphagia without obvious stricture status post dilation-52 French 2. Stable small duodenal carcinoid status post biopsy  RECOMMENDATIONS: 1.  Clear liquids until 5 PM, then soft foods rest of day.  Resume prior diet tomorrow. 2.  Continue current medications 3.  Repeat Endoscopy in 3 years with surveillance colonoscopy  REPEAT EXAM:  eSigned:  Eustace Quail, MD 07/07/2013 4:09 PM   [id number] Marquis Lunch, MD and The Patient

## 2013-07-08 ENCOUNTER — Telehealth: Payer: Self-pay

## 2013-07-08 NOTE — Telephone Encounter (Signed)
  Follow up Call-  Call back number 07/07/2013 10/23/2011 12/15/2010  Post procedure Call Back phone  # [phone]  Permission to leave phone message Yes Yes -     Patient questions:  Do you have a fever, pain , or abdominal swelling? no Pain Score  0 *  Have you tolerated food without any problems? yes  Have you been able to return to your normal activities? yes  Do you have any questions about your discharge instructions: Diet   no Medications  no Follow up visit  no  Do you have questions or concerns about your Care? no  Actions: * If pain score is 4 or above: No action needed, pain <4.

## 2013-07-10 ENCOUNTER — Encounter: Payer: Self-pay | Admitting: Internal Medicine

## 2013-10-01 ENCOUNTER — Other Ambulatory Visit: Payer: Self-pay | Admitting: *Deleted

## 2013-10-01 MED ORDER — TAMSULOSIN HCL 0.4 MG PO CAPS: 0.4000 mg | ORAL_CAPSULE | Freq: Every day | ORAL | Status: DC

## 2013-11-10 LAB — HM DIABETES EYE EXAM

## 2013-11-19 ENCOUNTER — Telehealth: Payer: Self-pay | Admitting: *Deleted

## 2013-11-19 DIAGNOSIS — E119 Type 2 diabetes mellitus without complications: Secondary | ICD-10-CM

## 2013-11-19 NOTE — Telephone Encounter (Signed)
Patient coming in for a diabetic follow up only A1c, bmet ordered Diabetic bundle

## 2013-12-05 ENCOUNTER — Other Ambulatory Visit (INDEPENDENT_AMBULATORY_CARE_PROVIDER_SITE_OTHER): Payer: Medicare Other

## 2013-12-05 DIAGNOSIS — E119 Type 2 diabetes mellitus without complications: Secondary | ICD-10-CM

## 2013-12-05 LAB — HEMOGLOBIN A1C: HEMOGLOBIN A1C: 7.9 % — AB (ref 4.6–6.5)

## 2013-12-05 LAB — BASIC METABOLIC PANEL
BUN: 27 mg/dL — ABNORMAL HIGH (ref 6–23)
CALCIUM: 9.6 mg/dL (ref 8.4–10.5)
CHLORIDE: 103 meq/L (ref 96–112)
CO2: 23 mEq/L (ref 19–32)
Creatinine, Ser: 1.4 mg/dL (ref 0.4–1.5)
GFR: 63.83 mL/min (ref >60.00)
Glucose, Bld: 88 mg/dL (ref 70–99)
Potassium: 4.6 mEq/L (ref 3.5–5.1)
SODIUM: 136 meq/L (ref 135–145)

## 2013-12-07 ENCOUNTER — Encounter: Payer: Self-pay | Admitting: Internal Medicine

## 2013-12-10 ENCOUNTER — Encounter: Payer: Self-pay | Admitting: Internal Medicine

## 2013-12-10 ENCOUNTER — Other Ambulatory Visit (INDEPENDENT_AMBULATORY_CARE_PROVIDER_SITE_OTHER): Payer: Medicare Other

## 2013-12-10 ENCOUNTER — Ambulatory Visit (INDEPENDENT_AMBULATORY_CARE_PROVIDER_SITE_OTHER): Payer: Medicare Other | Admitting: Internal Medicine

## 2013-12-10 VITALS — BP 128/80 | HR 81 | Temp 98.5°F | Resp 16 | Ht 70.25 in | Wt 233.0 lb

## 2013-12-10 DIAGNOSIS — Z23 Encounter for immunization: Secondary | ICD-10-CM

## 2013-12-10 DIAGNOSIS — R209 Unspecified disturbances of skin sensation: Secondary | ICD-10-CM | POA: Insufficient documentation

## 2013-12-10 DIAGNOSIS — E785 Hyperlipidemia, unspecified: Secondary | ICD-10-CM

## 2013-12-10 DIAGNOSIS — Z0000 Encounter for general adult medical examination without abnormal findings: Secondary | ICD-10-CM

## 2013-12-10 DIAGNOSIS — G4733 Obstructive sleep apnea (adult) (pediatric): Secondary | ICD-10-CM

## 2013-12-10 DIAGNOSIS — C61 Malignant neoplasm of prostate: Secondary | ICD-10-CM

## 2013-12-10 DIAGNOSIS — E1165 Type 2 diabetes mellitus with hyperglycemia: Principal | ICD-10-CM

## 2013-12-10 DIAGNOSIS — K627 Radiation proctitis: Secondary | ICD-10-CM

## 2013-12-10 DIAGNOSIS — D539 Nutritional anemia, unspecified: Secondary | ICD-10-CM

## 2013-12-10 DIAGNOSIS — I10 Essential (primary) hypertension: Secondary | ICD-10-CM

## 2013-12-10 DIAGNOSIS — IMO0001 Reserved for inherently not codable concepts without codable children: Secondary | ICD-10-CM

## 2013-12-10 DIAGNOSIS — G479 Sleep disorder, unspecified: Secondary | ICD-10-CM

## 2013-12-10 DIAGNOSIS — K6289 Other specified diseases of anus and rectum: Secondary | ICD-10-CM

## 2013-12-10 LAB — LIPID PANEL
CHOL/HDL RATIO: 5
Cholesterol: 177 mg/dL (ref 0–200)
HDL: 33.7 mg/dL — ABNORMAL LOW (ref >39.00)
LDL Cholesterol: 125 mg/dL — ABNORMAL HIGH (ref 0–99)
NonHDL: 143.3
TRIGLYCERIDES: 92 mg/dL (ref 0.0–149.0)
VLDL: 18.4 mg/dL (ref 0.0–40.0)

## 2013-12-10 LAB — FERRITIN: Ferritin: 14.9 ng/mL — ABNORMAL LOW (ref 22.0–322.0)

## 2013-12-10 LAB — CBC WITH DIFFERENTIAL/PLATELET
BASOS PCT: 0.3 % (ref 0.0–3.0)
Basophils Absolute: 0 10*3/uL (ref 0.0–0.1)
EOS ABS: 0.1 10*3/uL (ref 0.0–0.7)
Eosinophils Relative: 1.7 % (ref 0.0–5.0)
HCT: 35.7 % — ABNORMAL LOW (ref 39.0–52.0)
HEMOGLOBIN: 12 g/dL — AB (ref 13.0–17.0)
LYMPHS ABS: 1.1 10*3/uL (ref 0.7–4.0)
LYMPHS PCT: 32.9 % (ref 12.0–46.0)
MCHC: 33.5 g/dL (ref 30.0–36.0)
MCV: 85 fl (ref 78.0–100.0)
Monocytes Absolute: 0.5 10*3/uL (ref 0.1–1.0)
Monocytes Relative: 13.3 % — ABNORMAL HIGH (ref 3.0–12.0)
NEUTROS ABS: 1.8 10*3/uL (ref 1.4–7.7)
Neutrophils Relative %: 51.8 % (ref 43.0–77.0)
Platelets: 277 10*3/uL (ref 150.0–400.0)
RBC: 4.19 Mil/uL — AB (ref 4.22–5.81)
RDW: 15.5 % (ref 11.5–15.5)
WBC: 3.4 10*3/uL — ABNORMAL LOW (ref 4.0–10.5)

## 2013-12-10 LAB — TSH: TSH: 1.02 u[IU]/mL (ref 0.35–4.50)

## 2013-12-10 LAB — PSA: PSA: 0.09 ng/mL — AB (ref 0.10–4.00)

## 2013-12-10 LAB — IBC PANEL
Iron: 70 ug/dL (ref 42–165)
SATURATION RATIOS: 14.9 % — AB (ref 20.0–50.0)
Transferrin: 334.7 mg/dL (ref 212.0–360.0)

## 2013-12-10 LAB — VITAMIN B12: VITAMIN B 12: 703 pg/mL (ref 211–911)

## 2013-12-10 LAB — FECAL OCCULT BLOOD, GUAIAC: Fecal Occult Blood: NEGATIVE

## 2013-12-10 LAB — FOLATE

## 2013-12-10 MED ORDER — ATORVASTATIN CALCIUM 20 MG PO TABS
20.0000 mg | ORAL_TABLET | Freq: Every day | ORAL | Status: DC
Start: 2013-12-10 — End: 2017-05-21

## 2013-12-10 NOTE — Assessment & Plan Note (Signed)
He needs to start a statin

## 2013-12-10 NOTE — Assessment & Plan Note (Signed)
No changes noted wrt this

## 2013-12-10 NOTE — Assessment & Plan Note (Addendum)

## 2013-12-10 NOTE — Progress Notes (Signed)
Subjective:    Patient ID: Antonio Adams, male    DOB: 11-06-1945, 68 y.o.   MRN: [id number]  Anemia Presents for follow-up visit. The condition has lasted for 3 years. Symptoms include paresthesias (numbness in both legs and feet). There has been no abdominal pain, anorexia, bruising/bleeding easily, confusion, fever, leg swelling, light-headedness, malaise/fatigue, pallor, palpitations, pica or weight loss. Signs of blood loss that are present include hematochezia. Signs of blood loss that are not present include hematemesis and melena. Past treatments include nothing. Past medical history includes cancer. There are no compliance problems.       Review of Systems  Constitutional: Negative.  Negative for fever, chills, weight loss, malaise/fatigue, diaphoresis, activity change, appetite change, fatigue and unexpected weight change.  HENT: Negative.   Eyes: Negative.   Respiratory: Positive for apnea. Negative for cough, choking, chest tightness, shortness of breath, wheezing and stridor.   Cardiovascular: Negative.  Negative for chest pain, palpitations and leg swelling.  Gastrointestinal: Positive for blood in stool and hematochezia. Negative for nausea, vomiting, abdominal pain, diarrhea, constipation, melena, abdominal distention, anal bleeding, rectal pain, anorexia and hematemesis.  Endocrine: Negative.  Negative for polydipsia, polyphagia and polyuria.  Genitourinary: Negative.  Negative for urgency, hematuria, flank pain, enuresis, difficulty urinating and testicular pain.  Musculoskeletal: Negative.  Negative for arthralgias, back pain, gait problem, joint swelling, myalgias, neck pain and neck stiffness.  Skin: Negative.  Negative for pallor and rash.  Allergic/Immunologic: Negative.   Neurological: Positive for paresthesias (numbness in both legs and feet). Negative for light-headedness.  Hematological: Negative.  Negative for adenopathy. Does not bruise/bleed easily.    Psychiatric/Behavioral: Negative.  Negative for confusion.       Objective:   Physical Exam  Vitals reviewed. Constitutional: He is oriented to person, place, and time. He appears well-developed and well-nourished. No distress.  HENT:  Head: Normocephalic and atraumatic.  Mouth/Throat: Oropharynx is clear and moist. No oropharyngeal exudate.  Eyes: Conjunctivae are normal. Right eye exhibits no discharge. Left eye exhibits no discharge. No scleral icterus.  Neck: Normal range of motion. Neck supple. No JVD present. No tracheal deviation present. No thyromegaly present.  Cardiovascular: Normal rate, regular rhythm, normal heart sounds and intact distal pulses.  Exam reveals no gallop and no friction rub.   No murmur heard. Pulmonary/Chest: Effort normal and breath sounds normal. No stridor. No respiratory distress. He has no wheezes. He has no rales. He exhibits no tenderness.  Abdominal: Soft. Bowel sounds are normal. He exhibits no distension and no mass. There is no tenderness. There is no rebound and no guarding. Hernia confirmed negative in the right inguinal area and confirmed negative in the left inguinal area.  Genitourinary: Prostate normal, testes normal and penis normal. Rectal exam shows external hemorrhoid and internal hemorrhoid. Rectal exam shows no fissure, no mass, no tenderness and anal tone normal. Guaiac negative stool. Prostate is not enlarged and not tender. Right testis shows no mass, no swelling and no tenderness. Right testis is descended. Left testis shows no mass, no swelling and no tenderness. Left testis is descended. Circumcised. No penile erythema or penile tenderness. No discharge found.  Musculoskeletal: Normal range of motion. He exhibits no edema and no tenderness.  Lymphadenopathy:    He has no cervical adenopathy.       Right: No inguinal adenopathy present.       Left: No inguinal adenopathy present.  Neurological: He is oriented to person, place, and  time.  Skin: Skin is  warm and dry. No rash noted. He is not diaphoretic. No erythema. No pallor.  Psychiatric: He has a normal mood and affect. His behavior is normal. Judgment and thought content normal.     Lab Results  Component Value Date   WBC 4.8 06/17/2012   HGB 13.0 06/17/2012   HCT 38.1* 06/17/2012   PLT 246 06/17/2012   GLUCOSE 88 12/05/2013   CHOL 163 06/11/2013   TRIG 71.0 06/11/2013   HDL 37.20* 06/11/2013   LDLCALC 112* 06/11/2013   ALT 19 06/11/2013   AST 16 06/11/2013   NA 136 12/05/2013   K 4.6 12/05/2013   CL 103 12/05/2013   CREATININE 1.4 12/05/2013   BUN 27* 12/05/2013   CO2 23 12/05/2013   TSH 0.85 11/11/2010   PSA 0.06* 11/11/2010   INR 1.02 07/14/2010   HGBA1C 7.9* 12/05/2013       Assessment & Plan:

## 2013-12-10 NOTE — Assessment & Plan Note (Signed)
His BP is well controlled 

## 2013-12-10 NOTE — Assessment & Plan Note (Signed)
CBC is stable, vitamin levels are normal This appears to be anemia of chronic disease with chronic rectal blood loss

## 2013-12-10 NOTE — Patient Instructions (Signed)
Type 2 Diabetes Mellitus Type 2 diabetes mellitus, often simply referred to as type 2 diabetes, is a long-lasting (chronic) disease. In type 2 diabetes, the pancreas does not make enough insulin (a hormone), the cells are less responsive to the insulin that is made (insulin resistance), or both. Normally, insulin moves sugars from food into the tissue cells. The tissue cells use the sugars for energy. The lack of insulin or the lack of normal response to insulin causes excess sugars to build up in the blood instead of going into the tissue cells. As a result, high blood sugar (hyperglycemia) develops. The effect of high sugar (glucose) levels can cause many complications. Type 2 diabetes was also previously called adult-onset diabetes, but it can occur at any age.  RISK FACTORS  A person is predisposed to developing type 2 diabetes if someone in the family has the disease and also has one or more of the following primary risk factors:  Overweight.  An inactive lifestyle.  A history of consistently eating high-calorie foods. Maintaining a normal weight and regular physical activity can reduce the chance of developing type 2 diabetes. SYMPTOMS  A person with type 2 diabetes may not show symptoms initially. The symptoms of type 2 diabetes appear slowly. The symptoms include:  Increased thirst (polydipsia).  Increased urination (polyuria).  Increased urination during the night (nocturia).  Weight loss. This weight loss may be rapid.  Frequent, recurring infections.  Tiredness (fatigue).  Weakness.  Vision changes, such as blurred vision.  Fruity smell to your breath.  Abdominal pain.  Nausea or vomiting.  Cuts or bruises which are slow to heal.  Tingling or numbness in the hands or feet. DIAGNOSIS Type 2 diabetes is frequently not diagnosed until complications of diabetes are present. Type 2 diabetes is diagnosed when symptoms or complications are present and when blood  glucose levels are increased. Your blood glucose level may be checked by one or more of the following blood tests:  A fasting blood glucose test. You will not be allowed to eat for at least 8 hours before a blood sample is taken.  A random blood glucose test. Your blood glucose is checked at any time of the day regardless of when you ate.  A hemoglobin A1c blood glucose test. A hemoglobin A1c test provides information about blood glucose control over the previous 3 months.  An oral glucose tolerance test (OGTT). Your blood glucose is measured after you have not eaten (fasted) for 2 hours and then after you drink a glucose-containing beverage. TREATMENT   You may need to take insulin or diabetes medicine daily to keep blood glucose levels in the desired range.  If you use insulin, you may need to adjust the dosage depending on the carbohydrates that you eat with each meal or snack. The treatment goal is to maintain the before meal blood sugar (preprandial glucose) level at 70-130 mg/dL. HOME CARE INSTRUCTIONS   Have your hemoglobin A1c level checked twice a year.  Perform daily blood glucose monitoring as directed by your health care provider.  Monitor urine ketones when you are ill and as directed by your health care provider.  Take your diabetes medicine or insulin as directed by your health care provider to maintain your blood glucose levels in the desired range.  Never run out of diabetes medicine or insulin. It is needed every day.  If you are using insulin, you may need to adjust the amount of insulin given based on your intake of   carbohydrates. Carbohydrates can raise blood glucose levels but need to be included in your diet. Carbohydrates provide vitamins, minerals, and fiber which are an essential part of a healthy diet. Carbohydrates are found in fruits, vegetables, whole grains, dairy products, legumes, and foods containing added sugars.  Eat healthy foods. You should make an  appointment to see a registered dietitian to help you create an eating plan that is right for you.  Lose weight if you are overweight.  Carry a medical alert card or wear your medical alert jewelry.  Carry a 15-gram carbohydrate snack with you at all times to treat low blood glucose (hypoglycemia). Some examples of 15-gram carbohydrate snacks include:  Glucose tablets, 3 or 4.  Glucose gel, 15-gram tube.  Raisins, 2 tablespoons (24 grams).  Jelly beans, 6.  Animal crackers, 8.  Regular pop, 4 ounces (120 mL).  Gummy treats, 9.  Recognize hypoglycemia. Hypoglycemia occurs with blood glucose levels of 70 mg/dL and below. The risk for hypoglycemia increases when fasting or skipping meals, during or after intense exercise, and during sleep. Hypoglycemia symptoms can include:  Tremors or shakes.  Decreased ability to concentrate.  Sweating.  Increased heart rate.  Headache.  Dry mouth.  Hunger.  Irritability.  Anxiety.  Restless sleep.  Altered speech or coordination.  Confusion.  Treat hypoglycemia promptly. If you are alert and able to safely swallow, follow the 15:15 rule:  Take 15-20 grams of rapid-acting glucose or carbohydrate. Rapid-acting options include glucose gel, glucose tablets, or 4 ounces (120 mL) of fruit juice, regular soda, or low-fat milk.  Check your blood glucose level 15 minutes after taking the glucose.  Take 15-20 grams more of glucose if the repeat blood glucose level is still 70 mg/dL or below.  Eat a meal or snack within 1 hour once blood glucose levels return to normal.  Be alert to feeling very thirsty and urinating more frequently than usual, which are early signs of hyperglycemia. An early awareness of hyperglycemia allows for prompt treatment. Treat hyperglycemia as directed by your health care provider.  Engage in at least 150 minutes of moderate-intensity physical activity a week, spread over at least 3 days of the week or as  directed by your health care provider. In addition, you should engage in resistance exercise at least 2 times a week or as directed by your health care provider. Try to spend no more than 90 minutes at one time inactive.  Adjust your medicine and food intake as needed if you start a new exercise or sport.  Follow your sick-day plan anytime you are unable to eat or drink as usual.  Do not use any tobacco products including cigarettes, chewing tobacco, or electronic cigarettes. If you need help quitting, ask your health care provider.  Limit alcohol intake to no more than 1 drink per day for nonpregnant women and 2 drinks per day for men. You should drink alcohol only when you are also eating food. Talk with your health care provider whether alcohol is safe for you. Tell your health care provider if you drink alcohol several times a week.  Keep all follow-up visits as directed by your health care provider. This is important.  Schedule an eye exam soon after the diagnosis of type 2 diabetes and then annually.  Perform daily skin and foot care. Examine your skin and feet daily for cuts, bruises, redness, nail problems, bleeding, blisters, or sores. A foot exam by a health care provider should be done annually.    Brush your teeth and gums at least twice a day and floss at least once a day. Follow up with your dentist regularly.  Share your diabetes management plan with your workplace or school.  Stay up-to-date with immunizations. It is recommended that people with diabetes who are over 65 years old get the pneumonia vaccine. In some cases, two separate shots may be given. Ask your health care provider if your pneumonia vaccination is up-to-date.  Learn to manage stress.  Obtain ongoing diabetes education and support as needed.  Participate in or seek rehabilitation as needed to maintain or improve independence and quality of life. Request a physical or occupational therapy referral if you are  having foot or hand numbness, or difficulties with grooming, dressing, eating, or physical activity. SEEK MEDICAL CARE IF:   You are unable to eat food or drink fluids for more than 6 hours.  You have nausea and vomiting for more than 6 hours.  Your blood glucose level is over 240 mg/dL.  There is a change in mental status.  You develop an additional serious illness.  You have diarrhea for more than 6 hours.  You have been sick or have had a fever for a couple of days and are not getting better.  You have pain during any physical activity.  SEEK IMMEDIATE MEDICAL CARE IF:  You have difficulty breathing.  You have moderate to large ketone levels. MAKE SURE YOU:  Understand these instructions.  Will watch your condition.  Will get help right away if you are not doing well or get worse. Document Released: 05/01/2005 Document Revised: 09/15/2013 Document Reviewed: 11/28/2011 ExitCare Patient Information 2015 [employer]. This information is not intended to replace advice given to you by your health care provider. Make sure you discuss any questions you have with your health care provider. Health Maintenance A healthy lifestyle and preventative care can promote health and wellness.  Maintain regular health, dental, and eye exams.  Eat a healthy diet. Foods like vegetables, fruits, whole grains, low-fat dairy products, and lean protein foods contain the nutrients you need and are low in calories. Decrease your intake of foods high in solid fats, added sugars, and salt. Get information about a proper diet from your health care provider, if necessary.  Regular physical exercise is one of the most important things you can do for your health. Most adults should get at least 150 minutes of moderate-intensity exercise (any activity that increases your heart rate and causes you to sweat) each week. In addition, most adults need muscle-strengthening exercises on 2 or more days a week.    Maintain a healthy weight. The body mass index (BMI) is a screening tool to identify possible weight problems. It provides an estimate of body fat based on height and weight. Your health care provider can find your BMI and can help you achieve or maintain a healthy weight. For males 20 years and older:  A BMI below 18.5 is considered underweight.  A BMI of 18.5 to 24.9 is normal.  A BMI of 25 to 29.9 is considered overweight.  A BMI of 30 and above is considered obese.  Maintain normal blood lipids and cholesterol by exercising and minimizing your intake of saturated fat. Eat a balanced diet with plenty of fruits and vegetables. Blood tests for lipids and cholesterol should begin at age 20 and be repeated every 5 years. If your lipid or cholesterol levels are high, you are over age 50, or you are at high   risk for heart disease, you may need your cholesterol levels checked more frequently.Ongoing high lipid and cholesterol levels should be treated with medicines if diet and exercise are not working.  If you smoke, find out from your health care provider how to quit. If you do not use tobacco, do not start.  Lung cancer screening is recommended for adults aged 55-80 years who are at high risk for developing lung cancer because of a history of smoking. A yearly low-dose CT scan of the lungs is recommended for people who have at least a 30-pack-year history of smoking and are current smokers or have quit within the past 15 years. A pack year of smoking is smoking an average of 1 pack of cigarettes a day for 1 year (for example, a 30-pack-year history of smoking could mean smoking 1 pack a day for 30 years or 2 packs a day for 15 years). Yearly screening should continue until the smoker has stopped smoking for at least 15 years. Yearly screening should be stopped for people who develop a health problem that would prevent them from having lung cancer treatment.  If you choose to drink alcohol, do not  have more than 2 drinks per day. One drink is considered to be 12 oz (360 mL) of beer, 5 oz (150 mL) of wine, or 1.5 oz (45 mL) of liquor.  Avoid the use of street drugs. Do not share needles with anyone. Ask for help if you need support or instructions about stopping the use of drugs.  High blood pressure causes heart disease and increases the risk of stroke. Blood pressure should be checked at least every 1-2 years. Ongoing high blood pressure should be treated with medicines if weight loss and exercise are not effective.  If you are 45-79 years old, ask your health care provider if you should take aspirin to prevent heart disease.  Diabetes screening involves taking a blood sample to check your fasting blood sugar level. This should be done once every 3 years after age 45 if you are at a normal weight and without risk factors for diabetes. Testing should be considered at a younger age or be carried out more frequently if you are overweight and have at least 1 risk factor for diabetes.  Colorectal cancer can be detected and often prevented. Most routine colorectal cancer screening begins at the age of 50 and continues through age 75. However, your health care provider may recommend screening at an earlier age if you have risk factors for colon cancer. On a yearly basis, your health care provider may provide home test kits to check for hidden blood in the stool. A small camera at the end of a tube may be used to directly examine the colon (sigmoidoscopy or colonoscopy) to detect the earliest forms of colorectal cancer. Talk to your health care provider about this at age 50 when routine screening begins. A direct exam of the colon should be repeated every 5-10 years through age 75, unless early forms of precancerous polyps or small growths are found.  People who are at an increased risk for hepatitis B should be screened for this virus. You are considered at high risk for hepatitis B if:  You were born  in a country where hepatitis B occurs often. Talk with your health care provider about which countries are considered high risk.  Your parents were born in a high-risk country and you have not received a shot to protect against hepatitis B (hepatitis B   vaccine).  You have HIV or AIDS.  You use needles to inject street drugs.  You live with, or have sex with, someone who has hepatitis B.  You are a man who has sex with other men (MSM).  You get hemodialysis treatment.  You take certain medicines for conditions like cancer, organ transplantation, and autoimmune conditions.  Hepatitis C blood testing is recommended for all people born from 1945 through 1965 and any individual with known risk factors for hepatitis C.  Healthy men should no longer receive prostate-specific antigen (PSA) blood tests as part of routine cancer screening. Talk to your health care provider about prostate cancer screening.  Testicular cancer screening is not recommended for adolescents or adult males who have no symptoms. Screening includes self-exam, a health care provider exam, and other screening tests. Consult with your health care provider about any symptoms you have or any concerns you have about testicular cancer.  Practice safe sex. Use condoms and avoid high-risk sexual practices to reduce the spread of sexually transmitted infections (STIs).  You should be screened for STIs, including gonorrhea and chlamydia if:  You are sexually active and are younger than 24 years.  You are older than 24 years, and your health care provider tells you that you are at risk for this type of infection.  Your sexual activity has changed since you were last screened, and you are at an increased risk for chlamydia or gonorrhea. Ask your health care provider if you are at risk.  If you are at risk of being infected with HIV, it is recommended that you take a prescription medicine daily to prevent HIV infection. This is  called pre-exposure prophylaxis (PrEP). You are considered at risk if:  You are a man who has sex with other men (MSM).  You are a heterosexual man who is sexually active with multiple partners.  You take drugs by injection.  You are sexually active with a partner who has HIV.  Talk with your health care provider about whether you are at high risk of being infected with HIV. If you choose to begin PrEP, you should first be tested for HIV. You should then be tested every 3 months for as long as you are taking PrEP.  Use sunscreen. Apply sunscreen liberally and repeatedly throughout the day. You should seek shade when your shadow is shorter than you. Protect yourself by wearing long sleeves, pants, a wide-brimmed hat, and sunglasses year round whenever you are outdoors.  Tell your health care provider of new moles or changes in moles, especially if there is a change in shape or color. Also, tell your health care provider if a mole is larger than the size of a pencil eraser.  A one-time screening for abdominal aortic aneurysm (AAA) and surgical repair of large AAAs by ultrasound is recommended for men aged 65-75 years who are current or former smokers.  Stay current with your vaccines (immunizations). Document Released: 10/28/2007 Document Revised: 05/06/2013 Document Reviewed: 09/26/2010 ExitCare Patient Information 2015 [employer]. This information is not intended to replace advice given to you by your health care provider. Make sure you discuss any questions you have with your health care provider.  

## 2013-12-10 NOTE — Assessment & Plan Note (Signed)
His blood sugars are relatively well controlled I have asked him to be more compliant with his meds and lifestyle modifications

## 2013-12-10 NOTE — Progress Notes (Signed)
Pre visit review using our clinic review tool, if applicable. No additional management support is needed unless otherwise documented below in the visit note. 

## 2013-12-12 ENCOUNTER — Encounter: Payer: Self-pay | Admitting: Pulmonary Disease

## 2013-12-12 ENCOUNTER — Ambulatory Visit (INDEPENDENT_AMBULATORY_CARE_PROVIDER_SITE_OTHER): Payer: Medicare Other | Admitting: Pulmonary Disease

## 2013-12-12 VITALS — BP 134/80 | HR 86 | Ht 71.0 in | Wt 230.0 lb

## 2013-12-12 DIAGNOSIS — G4733 Obstructive sleep apnea (adult) (pediatric): Secondary | ICD-10-CM

## 2013-12-12 NOTE — Assessment & Plan Note (Signed)
It turns out that the real reason for his visit is to inquire about a correlation between agent orange exposure in [place] and OSA. I assured him that there is no correlation between the 2 that I am aware of. I told him that I would be unable to certify anything to that effect to help him with his benefits application. He will provide [place] with his PSG and CPAP download report. I'm happy to help coordinate his CPAP care, it does seem that he gets a supplies from the [place] in a timely manner. Otherwise, he can continue his followup with the [place].

## 2013-12-12 NOTE — Progress Notes (Signed)
Subjective:    Patient ID: Antonio Adams, male    DOB: 01/24/1946, 68 y.o.   MRN: [id number]  HPI  68 year old [place] war veteran, referred for evaluation of obstructive sleep apnea. He was diagnosed 3 years ago plan overnight polysomnogram that showed an AHI of 17 events per hour (by his report), he [facility] the medium fullface mask. This was checked about a week ago, he has a [employer]star auto CPAP machine which seems to be functioning well, he was given new filters and a small fullface mask and the download was checked. He states that he is compliant with his machine and uses it for at least 4 hours every night. Epworth sleepiness score is 6 His bedtime is 11:30 to 12:30 AM, sleep latency is about an hour, he sleeps on his right side with one pillow, reports 3-4 nocturnal awakenings due to bathroom visits and is out of bed by 6 AM with occasional dryness of mouth but denies headaches or grogginess. Snoring is reduced per his wife on CPAP and he reports feeling refreshed if he uses his CPAP. His medications were reviewed. He's a diabetic hypertensive, also has a history of prostate cancer and posttraumatic stress disorder. He also reports exposure to [employer] and is trying to get benefits from the [place]. It turns out that his DAV has advised him to inquire about a correlation between Agent Orange and OSA -and that is the real reason of his visit   Past Medical History  Diagnosis Date  . HYPERLIPIDEMIA 03/16/2007    takes Crestor daily  . CARPAL TUNNEL SYNDROME, BILATERAL 03/16/2007  . ALLERGIC RHINITIS 03/16/2007  . BACK PAIN WITH RADICULOPATHY 11/23/2008  . LEG CRAMPS 05/22/2007  . PARESTHESIA 05/21/2007  . GASTROINTESTINAL HEMORRHAGE, HX OF 03/16/2007  . Overactive bladder   . Hemorrhoids   . ADENOCARCINOMA, PROSTATE, GLEASON GRADE 6 12/21/2009  . HYPERTENSION 03/16/2007    takes Diltiazem and Lisinopril daily   . Sleep apnea   . Chronic back pain     . Arthritis     back   . GERD 03/16/2007    takes Omeprazole daily  . Rectal bleeding     Dr.Norins has explained its from the Radiation that he has received  . History of colon polyps   . Urinary frequency     takes Toviaz daily  . Nocturia   . DIABETES MELLITUS, TYPE II 03/16/2007    takes Januvia and MEtformin daily  . Constipation     takes Senokot daily  . Glaucoma     mild  . Depression     occasionally  . Proctitis     Past Surgical History  Procedure Laterality Date  . Back surgery  1983, 2008    repeat surgery; ESI '08  . Shoulder surgery  LFT  . Carpal tunnel release      bilateral  . Stress cardiolite  12/24/2003    negative for ischemia  . Wound exploration      management of a wound that he sustained in the [employer]  . Prostate surgery  march 2012    seed implant  . Cervical fusion    . Lumbar laminectomy  10/30/2011    Procedure: MICRODISCECTOMY LUMBAR LAMINECTOMY;  Surgeon: Jessy Oto, MD;  Location: [place];  Service: Orthopedics;  Laterality: Right;  Right L4-5 and L5-S1 Microdiscectomy  . Colonosocpy    . Esophagogastroduodenoscopy  ED  . Lumbar laminectomy  06/17/2012    Procedure: MICRODISCECTOMY LUMBAR LAMINECTOMY;  Surgeon: Jessy Oto, MD;  Location: [place];  Service: Orthopedics;  Laterality: N/A;  Right L5-S1 microdiscectomy    No Known Allergies  History   Social History  . Marital Status: Married    Spouse Name: Deneise Lever    Number of Children: 2  . Years of Education: 12+   Occupational History  . Event organiser; Curator     retired from Event organiser   Social History Main Topics  . Smoking status: Never Smoker   . Smokeless tobacco: Never Used  . Alcohol Use: No  . Drug Use: No  . Sexual Activity: Yes    Partners: Female   Other Topics Concern  . Not on file   Social History Narrative   Married-divorced '89, remarried '96. 2 sons, 1 daughter. 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 He wishes to be a full resuscitation candidate.       Daily caffeine - one or two cups daily   Right handed.    Family History  Problem Relation Age of Onset  . Cancer Sister 30    Cancer  . Hypertension Other   . Colon cancer Neg Hx   . Esophageal cancer Neg Hx   . Stomach cancer Neg Hx      Review of Systems  Constitutional: Negative for fever and unexpected weight change.  HENT: Positive for postnasal drip and sinus pressure. Negative for congestion, dental problem, ear pain, nosebleeds, rhinorrhea, sneezing, sore throat and trouble swallowing.   Eyes: Negative for redness and itching.  Respiratory: Negative for cough, chest tightness, shortness of breath and wheezing.   Cardiovascular: Negative for palpitations and leg swelling.  Gastrointestinal: Negative for nausea and vomiting.  Genitourinary: Negative for dysuria.  Musculoskeletal: Negative for joint swelling.  Skin: Negative for rash.  Neurological: Negative for headaches.  Hematological: Does not bruise/bleed easily.  Psychiatric/Behavioral: Negative for dysphoric mood. The patient is not nervous/anxious.        Objective:   Physical Exam  Gen. Pleasant, obese, in no distress, normal affect ENT - no lesions, no post nasal drip, class 2-3 airway Neck: No JVD, no thyromegaly, no carotid bruits Lungs: no use of accessory muscles, no dullness to percussion, decreased without rales or rhonchi  Cardiovascular: Rhythm regular, heart sounds  normal, no murmurs or gallops, no peripheral edema Abdomen: soft and non-tender, no hepatosplenomegaly, BS normal. Musculoskeletal: No deformities, no cyanosis or clubbing Neuro:  alert, non focal, no tremors       Assessment & Plan:

## 2013-12-12 NOTE — Patient Instructions (Signed)
Please get me copies of your sleep study & CPAP report

## 2013-12-17 ENCOUNTER — Telehealth: Payer: Self-pay | Admitting: Internal Medicine

## 2013-12-17 NOTE — Telephone Encounter (Signed)
He has diabetic eye damage, he needs to maintain good control of his blood sugars

## 2013-12-17 NOTE — Telephone Encounter (Signed)
Pt dropped off documents from eye examination that needs to be reviewed by Dr Ronnald Ramp. Pt would like a call to advise whether a follow up appt is necessary based on the documents provided. Documents have been placed in Dr Ronnald Ramp box for review. Please contact pt when request is complete.

## 2013-12-18 NOTE — Telephone Encounter (Signed)
Patient notified per MD.

## 2013-12-24 NOTE — Progress Notes (Unsigned)
   Subjective:    Patient ID: Antonio Adams, male    DOB: [birth year], 68 y.o.   MRN: [id number]  HPI  Not seen  Review of Systems     Objective:   Physical Exam        Assessment & Plan:

## 2014-01-17 ENCOUNTER — Other Ambulatory Visit: Payer: Self-pay | Admitting: Internal Medicine

## 2014-01-21 ENCOUNTER — Telehealth: Payer: Self-pay

## 2014-01-21 NOTE — Telephone Encounter (Signed)
LVM for pt to call back.   RE: AWV scheduled.

## 2014-01-28 ENCOUNTER — Ambulatory Visit: Payer: Medicare Other | Admitting: *Deleted

## 2014-01-29 ENCOUNTER — Encounter: Payer: Self-pay | Admitting: Nurse Practitioner

## 2014-01-29 ENCOUNTER — Ambulatory Visit (INDEPENDENT_AMBULATORY_CARE_PROVIDER_SITE_OTHER): Payer: Medicare Other | Admitting: Nurse Practitioner

## 2014-01-29 VITALS — BP 142/80 | HR 75 | Temp 98.5°F | Ht 70.25 in | Wt 233.0 lb

## 2014-01-29 DIAGNOSIS — F4310 Post-traumatic stress disorder, unspecified: Secondary | ICD-10-CM

## 2014-01-29 DIAGNOSIS — Z0000 Encounter for general adult medical examination without abnormal findings: Secondary | ICD-10-CM

## 2014-01-29 NOTE — Progress Notes (Signed)
Subjective:    Antonio Adams is a 67 y.o. male who presents for Medicare Annual/Subsequent preventive examination.   Preventive Screening-Counseling & Management  Tobacco History  Smoking status  . Never Smoker   Smokeless tobacco  . Never Used    Problems Prior to Visit 1.   Current Problems (verified) Patient Active Problem List   Diagnosis Date Noted  . PARESTHESIA 12/10/2013  . Unspecified deficiency anemia 12/10/2013  . Radiation proctitis 06/04/2012  . History of rectal bleeding 02/03/2012  . Spinal stenosis of lumbar region with radiculopathy 10/30/2011    Class: Chronic  . OSA (obstructive sleep apnea) 11/24/2010  . Obesity, Class I, BMI 30-34.9 11/24/2010  . Routine general medical examination at a health care facility 11/24/2010  . ADENOCARCINOMA, PROSTATE, GLEASON GRADE 6 12/21/2009  . Type II or unspecified type diabetes mellitus without mention of complication, uncontrolled 03/16/2007  . Hyperlipidemia with target LDL less than 100 03/16/2007  . HYPERTENSION 03/16/2007  . GERD 03/16/2007    Medications Prior to Visit Current Outpatient Prescriptions on File Prior to Visit  Medication Sig Dispense Refill  . aspirin EC 81 MG tablet Take 81 mg by mouth daily.      Marland Kitchen atorvastatin (LIPITOR) 20 MG tablet Take 1 tablet (20 mg total) by mouth daily.  90 tablet  3  . Cholecalciferol 1000 UNITS capsule Take 1,000 Units by mouth daily.       Marland Kitchen diltiazem (DILACOR XR) 120 MG 24 hr capsule Take 120 mg by mouth daily.        Marland Kitchen gabapentin (NEURONTIN) 100 MG capsule Take 1 capsule (100 mg total) by mouth at bedtime.  30 capsule  11  . gemfibrozil (LOPID) 600 MG tablet Take 600 mg by mouth 2 (two) times daily.       Marland Kitchen glucose blood (ONE TOUCH ULTRA TEST) test strip 1 each by Other route 2 (two) times daily. And lancets 2/day 250.03  100 each  12  . HYDROcodone-acetaminophen (NORCO/VICODIN) 5-325 MG per tablet Take 1 tablet by mouth every 6 (six) hours as needed.        Marland Kitchen ibuprofen (ADVIL,MOTRIN) 800 MG tablet Take 800 mg by mouth every 8 (eight) hours as needed.      Marland Kitchen lisinopril (PRINIVIL,ZESTRIL) 40 MG tablet Take 20 mg by mouth daily.       . multivitamin-iron-minerals-folic acid (CENTRUM) chewable tablet Chew 1 tablet by mouth daily.      . Omega-3 Fatty Acids ([facility] FISH OIL PO) Take 1 capsule by mouth daily.       Marland Kitchen omeprazole (PRILOSEC) 40 MG capsule TAKE ONE CAPSULE EVERY DAY  30 capsule  10  . pioglitazone (ACTOS) 15 MG tablet Take 1 tablet (15 mg total) by mouth daily. Okay for generic  30 tablet  5  . sertraline (ZOLOFT) 100 MG tablet Take 100 tablets by mouth daily.      . tamsulosin (FLOMAX) 0.4 MG CAPS capsule TAKE 1 CAPSULE (0.4 MG TOTAL) BY MOUTH DAILY.  30 capsule  1  . zolpidem (AMBIEN) 10 MG tablet Take 10 mg by mouth at bedtime as needed.       . metFORMIN (GLUCOPHAGE) 1000 MG tablet Take 1 tablet (1,000 mg total) by mouth 2 (two) times daily with a meal.  60 tablet  1   No current facility-administered medications on file prior to visit.    Current Medications (verified) Current Outpatient Prescriptions  Medication Sig Dispense Refill  . aspirin EC 81 MG tablet  Take 81 mg by mouth daily.      Marland Kitchen atorvastatin (LIPITOR) 20 MG tablet Take 1 tablet (20 mg total) by mouth daily.  90 tablet  3  . Cholecalciferol 1000 UNITS capsule Take 1,000 Units by mouth daily.       Marland Kitchen diltiazem (DILACOR XR) 120 MG 24 hr capsule Take 120 mg by mouth daily.        Marland Kitchen gabapentin (NEURONTIN) 100 MG capsule Take 1 capsule (100 mg total) by mouth at bedtime.  30 capsule  11  . gemfibrozil (LOPID) 600 MG tablet Take 600 mg by mouth 2 (two) times daily.       Marland Kitchen glucose blood (ONE TOUCH ULTRA TEST) test strip 1 each by Other route 2 (two) times daily. And lancets 2/day 250.03  100 each  12  . HYDROcodone-acetaminophen (NORCO/VICODIN) 5-325 MG per tablet Take 1 tablet by mouth every 6 (six) hours as needed.       Marland Kitchen ibuprofen (ADVIL,MOTRIN) 800 MG tablet Take 800 mg  by mouth every 8 (eight) hours as needed.      Marland Kitchen lisinopril (PRINIVIL,ZESTRIL) 40 MG tablet Take 20 mg by mouth daily.       . multivitamin-iron-minerals-folic acid (CENTRUM) chewable tablet Chew 1 tablet by mouth daily.      . Omega-3 Fatty Acids ([facility] FISH OIL PO) Take 1 capsule by mouth daily.       Marland Kitchen omeprazole (PRILOSEC) 40 MG capsule TAKE ONE CAPSULE EVERY DAY  30 capsule  10  . pioglitazone (ACTOS) 15 MG tablet Take 1 tablet (15 mg total) by mouth daily. Okay for generic  30 tablet  5  . sertraline (ZOLOFT) 100 MG tablet Take 100 tablets by mouth daily.      . tamsulosin (FLOMAX) 0.4 MG CAPS capsule TAKE 1 CAPSULE (0.4 MG TOTAL) BY MOUTH DAILY.  30 capsule  1  . zolpidem (AMBIEN) 10 MG tablet Take 10 mg by mouth at bedtime as needed.       . metFORMIN (GLUCOPHAGE) 1000 MG tablet Take 1 tablet (1,000 mg total) by mouth 2 (two) times daily with a meal.  60 tablet  1   No current facility-administered medications for this visit.     Allergies (verified) Review of patient's allergies indicates no known allergies.   PAST HISTORY  Family History Family History  Problem Relation Age of Onset  . Cancer Sister 30    Cancer  . Hypertension Other   . Colon cancer Neg Hx   . Esophageal cancer Neg Hx   . Stomach cancer Neg Hx     Social History History  Substance Use Topics  . Smoking status: Never Smoker   . Smokeless tobacco: Never Used  . Alcohol Use: No    Are there smokers in your home (other than you)?  Yes  Risk Factors Current exercise habits: The patient does not participate in regular exercise at present.  Dietary issues discussed: Eats a balanced diet, Has appointment with Nurtritionist on October 10/22   Cardiac risk factors: advanced age (older than 45 for men, 38 for women), diabetes mellitus, dyslipidemia, hypertension, male gender, obesity (BMI >= 30 kg/m2) and sedentary lifestyle.  Depression Screen (Note: if answer to either of the following is "Yes", a more  complete depression screening is indicated)   Q1: Over the past two weeks, have you felt down, depressed or hopeless? Yes  Q2: Over the past two weeks, have you felt little interest or pleasure in doing things?  Yes  Have you lost interest or pleasure in daily life? No  Do you often feel hopeless? No  Do you cry easily over simple problems? No  Activities of Daily Living In your present state of health, do you have any difficulty performing the following activities?:  Driving? No Managing money?  No Feeding yourself? No Getting from bed to chair? No Climbing a flight of stairs? Yes Preparing food and eating?: No Bathing or showering? No Getting dressed: No Getting to the toilet? No Using the toilet:No Moving around from place to place: No In the past year have you fallen or had a near fall?:No   Are you sexually active?  No  Do you have more than one partner?  No  Hearing Difficulties: No Do you often ask people to speak up or repeat themselves? No Do you experience ringing or noises in your ears? No Do you have difficulty understanding soft or whispered voices? No   Do you feel that you have a problem with memory? No  Do you often misplace items? No  Do you feel safe at home?  Yes  Cognitive Testing  Alert? Yes  Normal Appearance?Yes  Oriented to person? Yes  Place? Yes   Time? Yes  Recall of three objects?  Yes  Can perform simple calculations? Yes  Displays appropriate judgment?Yes  Can read the correct time from a watch face?Yes   Advanced Directives have been discussed with the patient? Yes   List the Names of Other Physician/Practitioners you currently use: 1.    Indicate any recent Medical Services you may have received from other than [facility] providers in the past year (date may be approximate).  Immunization History  Administered Date(s) Administered  . Influenza Whole 02/13/2012  . Pneumococcal Conjugate-13 12/10/2013  . Pneumococcal Polysaccharide-23  01/14/2010  . Td 01/29/2008  . Zoster 01/14/2010    Screening Tests Health Maintenance  Topic Date Due  . Urine Microalbumin  04/11/1956  . Foot Exam  01/15/2011  . Pneumococcal Polysaccharide Vaccine Age 69 And Over  04/12/2011  . Influenza Vaccine  12/13/2013  . Hemoglobin A1c  06/07/2014  . Ophthalmology Exam  11/11/2014  . Colonoscopy  10/22/2016  . Tetanus/tdap  01/28/2018  . Zostavax  Completed    All answers were reviewed with the patient and necessary referrals were made:  Carmelina Paddock, NP   01/29/2014   History reviewed: allergies, current medications, past family history, past medical history, past social history, past surgical history and problem list  Review of Systems not indicated    Objective:     Vision by Snellen chart: right eye:20/20, left eye:Eye exam performed by Opthalmologist in 7/15  Blood pressure 142/80, pulse 75, temperature 98.5 F (36.9 C), temperature source Oral, height 5' 10.25" (1.784 m), weight 233 lb (105.688 kg), SpO2 96.00%. Body mass index is 33.21 kg/(m^2).  No exam performed today, not indicated.     Assessment:     Patient presents for yearly preventative medicine examination. Medicare questionnaire was completed  All immunizations and health maintenance protocols were reviewed with the patient and needed orders were placed.  Appropriate screening laboratory values were ordered for the patient including screening of hyperlipidemia, renal function and hepatic function. If indicated by BPH, a PSA was ordered.  Medication reconciliation,  past medical history, social history, problem list and allergies were reviewed in detail with the patient  Goals were established with regard to weight loss, exercise, and  diet in compliance with medications  End of life planning was discussed.       Plan:     During the course of the visit the patient was educated and counseled about appropriate screening and preventive services  including:    Influenza vaccine  Diabetes screening  Nutrition counseling   Diet review for nutrition referral? Yes _x___  Not Indicated ____Patient has appointment with Nutritionist on 10/22   Patient Instructions (the written plan) was given to the patient.  Medicare Attestation I have personally reviewed: The patient's medical and social history Their use of alcohol, tobacco or illicit drugs Their current medications and supplements The patient's functional ability including ADLs,fall risks, home safety risks, cognitive, and hearing and visual impairment Diet and physical activities Evidence for depression or mood disorders  The patient's weight, height, BMI, and visual acuity have been recorded in the chart.  I have made referrals, counseling, and provided education to the patient based on review of the above and I have provided the patient with a written personalized care plan for preventive services.   Advanced directives information given  Opthalmology appointment peroformed in 7/15 Patient referred back to his Psychologist/Psychiatrist in [place] which patient sees for Post traumatic stress syndrome. Patient instructed to provide our office with Name and contact of Psychologist/Psychiatrist.  Name and dosage of new antidepressant he is taking as well as documentation of influenza vaccination patient states he was given at the [place].  Patient reports he gets all of his vaccines at the [place].    Steva Colder A, NP   01/29/2014

## 2014-01-29 NOTE — Patient Instructions (Addendum)
Keep appointment for nutritional specialist in October. Call office with name and contact of Psychologist/Psychiatrist, New antidepressant he is taking and dosage. Documentation of influenza vaccine given at [facility]   Weight reduction low carbohydrate, fat diet. Follow up appointment with Dr. Ronnald Ramp in 2 months.  Call clinic with questions or concerns.   Diabetes and Exercise Exercising regularly is important. It is not just about losing weight. It has many health benefits, such as:  Improving your overall fitness, flexibility, and endurance.  Increasing your bone density.  Helping with weight control.  Decreasing your body fat.  Increasing your muscle strength.  Reducing stress and tension.  Improving your overall health. People with diabetes who exercise gain additional benefits because exercise:  Reduces appetite.  Improves the body's use of blood sugar (glucose).  Helps lower or control blood glucose.  Decreases blood pressure.  Helps control blood lipids (such as cholesterol and triglycerides).  Improves the body's use of the hormone insulin by:  Increasing the body's insulin sensitivity.  Reducing the body's insulin needs.  Decreases the risk for heart disease because exercising:  Lowers cholesterol and triglycerides levels.  Increases the levels of good cholesterol (such as high-density lipoproteins [HDL]) in the body.  Lowers blood glucose levels. YOUR ACTIVITY PLAN  Choose an activity that you enjoy and set realistic goals. Your health care provider or diabetes educator can help you make an activity plan that works for you. Exercise regularly as directed by your health care provider. This includes:  Performing resistance training twice a week such as push-ups, sit-ups, lifting weights, or using resistance bands.  Performing 150 minutes of cardio exercises each week such as walking, running, or playing sports.  Staying active and spending no more than 90  minutes at one time being inactive. Even short bursts of exercise are good for you. Three 10-minute sessions spread throughout the day are just as beneficial as a single 30-minute session. Some exercise ideas include:  Taking the dog for a walk.  Taking the stairs instead of the elevator.  Dancing to your favorite song.  Doing an exercise video.  Doing your favorite exercise with a friend. RECOMMENDATIONS FOR EXERCISING WITH TYPE 1 OR TYPE 2 DIABETES   Check your blood glucose before exercising. If blood glucose levels are greater than 240 mg/dL, check for urine ketones. Do not exercise if ketones are present.  Avoid injecting insulin into areas of the body that are going to be exercised. For example, avoid injecting insulin into:  The arms when playing tennis.  The legs when jogging.  Keep a record of:  Food intake before and after you exercise.  Expected peak times of insulin action.  Blood glucose levels before and after you exercise.  The type and amount of exercise you have done.  Review your records with your health care provider. Your health care provider will help you to develop guidelines for adjusting food intake and insulin amounts before and after exercising.  If you take insulin or oral hypoglycemic agents, watch for signs and symptoms of hypoglycemia. They include:  Dizziness.  Shaking.  Sweating.  Chills.  Confusion.  Drink plenty of water while you exercise to prevent dehydration or heat stroke. Body water is lost during exercise and must be replaced.  Talk to your health care provider before starting an exercise program to make sure it is safe for you. Remember, almost any type of activity is better than none. Document Released: 07/22/2003 Document Revised: 09/15/2013 Document Reviewed: 10/08/2012 ExitCare  Patient Information 2015 [place]. This information is not intended to replace advice given to you by your health care provider. Make sure  you discuss any questions you have with your health care provider.  Advance Directive Advance directives are the legal documents that allow you to make choices about your health care and medical treatment if you cannot speak for yourself. Advance directives are a way for you to communicate your wishes to family, friends, and health care providers. The specified people can then convey your decisions about end-of-life care to avoid confusion if you should become unable to communicate. Ideally, the process of discussing and writing advance directives should happen over time rather than making decisions all at once. Advance directives can be modified as your situation changes, and you can change your mind at any time, even after you have signed the advance directives. Each state has its own laws regarding advance directives. You may want to check with your health care provider, attorney, or state representative about the law in your state. Below are some examples of advance directives. LIVING WILL A living will is a set of instructions documenting your wishes about medical care when you cannot care for yourself. It is used if you become:  Terminally ill.  Incapacitated.  Unable to communicate.  Unable to make decisions. Items to consider in your living will include:  The use or non-use of life-sustaining equipment, such as dialysis machines and breathing machines (ventilators).  A do not resuscitate (DNR) order, which is the instruction not to use cardiopulmonary resuscitation (CPR) if breathing or heartbeat stops.  Tube feeding.  Withholding of food and fluids.  Comfort (palliative) care when the goal becomes comfort rather than a cure.  Organ and tissue donation. A living will does not give instructions about distribution of your money and property if you should pass away. It is advisable to seek the expert advice of a lawyer in drawing up a will regarding your possessions. Decisions about  taxes, beneficiaries, and asset distribution will be legally binding. This process can relieve your family and friends of any burdens surrounding disputes or questions that may come up about the allocation of your assets. DO NOT RESUSCITATE (DNR) A do not resuscitate (DNR) order is a request to not have CPR in the event that your heart stops beating or you stop breathing. Unless given other instructions, a health care provider will try to help any patient whose heart has stopped or who has stopped breathing.  HEALTH CARE PROXY AND DURABLE POWER OF ATTORNEY FOR HEALTH CARE A health care proxy is a person (agent) appointed to make medical decisions for you if you cannot. Generally, people choose someone they know well and trust to represent their preferences when they can no longer do so. You should be sure to ask this person for agreement to act as your agent. An agent may have to exercise judgment in the event of a medical decision for which your wishes are not known. The durable power of attorney for health care is the legal document that names your health care proxy. Once written, it should be:  Signed.  Notarized.  Dated.  Copied.  Witnessed.  Incorporated into your medical record. You may also want to appoint someone to manage your financial affairs if you cannot. This is called a durable power of attorney for finances. It is a separate legal document from the durable power of attorney for health care. You may choose the same person or someone different  from your health care proxy to act as your agent in financial matters. Document Released: 08/08/2007 Document Revised: 05/06/2013 Document Reviewed: 09/18/2012 [facility] Patient Information 2015 [place], [place]. This information is not intended to replace advice given to you by your health care provider. Make sure you discuss any questions you have with your health care provider.

## 2014-01-29 NOTE — Progress Notes (Signed)
Pre visit review using our clinic review tool, if applicable. No additional management support is needed unless otherwise documented below in the visit note. 

## 2014-03-02 ENCOUNTER — Encounter: Payer: Medicare Other | Attending: Internal Medicine | Admitting: *Deleted

## 2014-03-02 ENCOUNTER — Encounter: Payer: Self-pay | Admitting: *Deleted

## 2014-03-02 VITALS — Ht 71.0 in | Wt 233.7 lb

## 2014-03-02 DIAGNOSIS — E119 Type 2 diabetes mellitus without complications: Secondary | ICD-10-CM | POA: Insufficient documentation

## 2014-03-02 DIAGNOSIS — Z713 Dietary counseling and surveillance: Secondary | ICD-10-CM | POA: Insufficient documentation

## 2014-03-02 NOTE — Progress Notes (Signed)
Diabetes Self-Management Education  Visit Type:    Appt. Start Time: 11am Appt. End Time: 12pm  03/02/2014  [name], identified by name and date of birth, is a 68 y.o. male with a diagnosis of Diabetes: Type 2.  Other people present during visit:  Patient diagnosed with diabetes 10-15 years ago. He attended education classes at that time. His A1c in July was 7.9, which has been a slow increase over the last year. He has made some changes over the last few months to increase intake of vegetables and limit starches and sweets, but needs a refresher. Activity is limited due to recent rotator cuff surgery, but he is able to walk.   ASSESSMENT Height 5\' 11"  (1.803 m), weight 233 lb 11.2 oz (106.006 kg). Body mass index is 32.61 kg/(m^2).  Initial Visit Information: Are you currently following a meal plan?: No   Are you taking your medications as prescribed?: Yes Are you checking your feet?: Yes   Psychosocial: Self-care barriers: None Self-management support: Doctor's office;Family Other persons present: Patient Patient Concerns: Nutrition/Meal planning;Healthy Lifestyle;Glycemic Control Special Needs: None Preferred Learning Style: Auditory;Visual;Hands on Learning Readiness: Contemplating  Complications:  Last HgB A1C per patient/outside source: 7.9 mg/dL How often do you check your blood sugar?: 1-2 times/day (Not every day, only when he feels like his blood sugar is high or low) Fasting Blood glucose range (mg/dL): 130-179;>200  Diet Intake: Breakfast: Sandwich (tenderloin, lettuce, tomato, mayo), orange juice (8oz) Snack (morning): None Lunch: Eats out: Sometimes skips, Vegetables (cabbage, black eyed peas, carrots), meat (steak) Snack (afternoon): Cookies, fruit Dinner: Similar to lunch Snack (evening): Nothing after 9 Beverage(s): Water, juice, occasional soda  Exercise: Exercise: ADL's (Limited due to shoulder surgery)  Individualized Plan for Diabetes  Self-Management Training:   Learning Objective:  Patient will have a greater understanding of diabetes self-management.   Education Topics Reviewed with Patient Today:  Definition of diabetes, type 1 and 2, and the diagnosis of diabetes Role of diet in the treatment of diabetes and the relationship between the three main macronutrients and blood glucose level;Food label reading, portion sizes and measuring food.;Carbohydrate counting Helped patient identify appropriate exercises in relation to his/her diabetes, diabetes complications and other health issue.  PATIENTS GOALS/Plan (Developed by the patient): Nutrition: Follow meal plan discussed;General guidelines for healthy choices and portions discussed (3-4 carb servings per meal, 1 serving per snack) Physical Activity: Exercise 3-5 times per week (Walking 20-30 minutes)  Expected Outcomes:  Demonstrated interest in learning. Expect positive outcomes  Education material provided: Meal plan card and Carbohydrate counting sheet  If problems or questions, patient to contact team via:  Phone  Future DSME appointment: PRN

## 2014-03-05 ENCOUNTER — Ambulatory Visit: Payer: Medicare Other | Admitting: *Deleted

## 2014-07-21 DIAGNOSIS — N3941 Urge incontinence: Secondary | ICD-10-CM | POA: Diagnosis not present

## 2014-07-21 DIAGNOSIS — R350 Frequency of micturition: Secondary | ICD-10-CM | POA: Diagnosis not present

## 2014-08-03 ENCOUNTER — Encounter: Payer: Self-pay | Admitting: Internal Medicine

## 2014-08-03 ENCOUNTER — Ambulatory Visit (INDEPENDENT_AMBULATORY_CARE_PROVIDER_SITE_OTHER): Payer: Medicare Other | Admitting: Internal Medicine

## 2014-08-03 ENCOUNTER — Other Ambulatory Visit (INDEPENDENT_AMBULATORY_CARE_PROVIDER_SITE_OTHER): Payer: Medicare Other

## 2014-08-03 VITALS — BP 122/78 | HR 76 | Temp 97.8°F | Resp 16 | Ht 71.0 in | Wt 231.0 lb

## 2014-08-03 DIAGNOSIS — I10 Essential (primary) hypertension: Secondary | ICD-10-CM | POA: Diagnosis not present

## 2014-08-03 DIAGNOSIS — Z8719 Personal history of other diseases of the digestive system: Secondary | ICD-10-CM | POA: Diagnosis not present

## 2014-08-03 DIAGNOSIS — E118 Type 2 diabetes mellitus with unspecified complications: Secondary | ICD-10-CM

## 2014-08-03 DIAGNOSIS — M4806 Spinal stenosis, lumbar region: Secondary | ICD-10-CM

## 2014-08-03 DIAGNOSIS — K5909 Other constipation: Secondary | ICD-10-CM

## 2014-08-03 DIAGNOSIS — M48061 Spinal stenosis, lumbar region without neurogenic claudication: Secondary | ICD-10-CM

## 2014-08-03 DIAGNOSIS — K648 Other hemorrhoids: Secondary | ICD-10-CM

## 2014-08-03 DIAGNOSIS — M5416 Radiculopathy, lumbar region: Secondary | ICD-10-CM

## 2014-08-03 LAB — CBC WITH DIFFERENTIAL/PLATELET
BASOS ABS: 0 10*3/uL (ref 0.0–0.1)
Basophils Relative: 0.7 % (ref 0.0–3.0)
EOS ABS: 0.1 10*3/uL (ref 0.0–0.7)
Eosinophils Relative: 2.1 % (ref 0.0–5.0)
HEMATOCRIT: 33.9 % — AB (ref 39.0–52.0)
HEMOGLOBIN: 11.5 g/dL — AB (ref 13.0–17.0)
Lymphocytes Relative: 29.1 % (ref 12.0–46.0)
Lymphs Abs: 0.9 10*3/uL (ref 0.7–4.0)
MCHC: 33.9 g/dL (ref 30.0–36.0)
MCV: 85.4 fl (ref 78.0–100.0)
Monocytes Absolute: 0.5 10*3/uL (ref 0.1–1.0)
Monocytes Relative: 16.3 % — ABNORMAL HIGH (ref 3.0–12.0)
Neutro Abs: 1.6 10*3/uL (ref 1.4–7.7)
Neutrophils Relative %: 51.8 % (ref 43.0–77.0)
Platelets: 225 10*3/uL (ref 150.0–400.0)
RBC: 3.97 Mil/uL — AB (ref 4.22–5.81)
RDW: 14.9 % (ref 11.5–15.5)
WBC: 3.1 10*3/uL — ABNORMAL LOW (ref 4.0–10.5)

## 2014-08-03 LAB — COMPREHENSIVE METABOLIC PANEL
ALT: 19 U/L (ref 0–53)
AST: 19 U/L (ref 0–37)
Albumin: 4.3 g/dL (ref 3.5–5.2)
Alkaline Phosphatase: 62 U/L (ref 39–117)
BILIRUBIN TOTAL: 0.3 mg/dL (ref 0.2–1.2)
BUN: 29 mg/dL — AB (ref 6–23)
CALCIUM: 9.6 mg/dL (ref 8.4–10.5)
CO2: 22 meq/L (ref 19–32)
Chloride: 105 mEq/L (ref 96–112)
Creatinine, Ser: 1.4 mg/dL (ref 0.40–1.50)
GFR: 64.75 mL/min (ref >60.00)
Glucose, Bld: 110 mg/dL — ABNORMAL HIGH (ref 70–99)
Potassium: 4.5 mEq/L (ref 3.5–5.1)
SODIUM: 135 meq/L (ref 135–145)
Total Protein: 7 g/dL (ref 6.0–8.3)

## 2014-08-03 LAB — URINALYSIS, ROUTINE W REFLEX MICROSCOPIC
Bilirubin Urine: NEGATIVE
Hgb urine dipstick: NEGATIVE
Ketones, ur: NEGATIVE
Leukocytes, UA: NEGATIVE
NITRITE: NEGATIVE
PH: 6 (ref 5.0–8.0)
RBC / HPF: NONE SEEN (ref 0–?)
SPECIFIC GRAVITY, URINE: 1.025 (ref 1.000–1.030)
Urine Glucose: NEGATIVE
Urobilinogen, UA: 0.2 (ref 0.0–1.0)
WBC, UA: NONE SEEN (ref 0–?)

## 2014-08-03 LAB — LIPID PANEL
CHOL/HDL RATIO: 5
Cholesterol: 150 mg/dL (ref 0–200)
HDL: 30.5 mg/dL — AB (ref >39.00)
LDL Cholesterol: 98 mg/dL (ref 0–99)
NONHDL: 119.5
Triglycerides: 110 mg/dL (ref 0.0–149.0)
VLDL: 22 mg/dL (ref 0.0–40.0)

## 2014-08-03 LAB — TSH: TSH: 0.73 u[IU]/mL (ref 0.35–4.50)

## 2014-08-03 LAB — MICROALBUMIN / CREATININE URINE RATIO
Creatinine,U: 180.2 mg/dL
Microalb Creat Ratio: 4.2 mg/g (ref 0.0–30.0)
Microalb, Ur: 7.5 mg/dL — ABNORMAL HIGH (ref 0.0–1.9)

## 2014-08-03 LAB — HEMOGLOBIN A1C: Hgb A1c MFr Bld: 8.9 % — ABNORMAL HIGH (ref 4.6–6.5)

## 2014-08-03 MED ORDER — LINACLOTIDE 145 MCG PO CAPS: 145.0000 ug | ORAL_CAPSULE | Freq: Every day | ORAL | Status: DC

## 2014-08-03 MED ORDER — IBUPROFEN 800 MG PO TABS: 800.0000 mg | ORAL_TABLET | Freq: Three times a day (TID) | ORAL | Status: DC | PRN

## 2014-08-03 NOTE — Patient Instructions (Signed)

## 2014-08-03 NOTE — Progress Notes (Signed)
Subjective:    Patient ID: Antonio Adams, male    DOB: 01-Feb-1946, 69 y.o.   MRN: [id number]  Hypertension Pertinent negatives include no chest pain, headaches, neck pain, palpitations or shortness of breath.  Hyperlipidemia Pertinent negatives include no chest pain, myalgias or shortness of breath.  Constipation This is a chronic problem. The current episode started more than 1 year ago. The problem has been gradually worsening since onset. His stool frequency is 1 time per day. The stool is described as formed, firm and blood coated. The patient is on a high fiber diet. He exercises regularly. There has been adequate water intake. Associated symptoms include back pain, hematochezia and hemorrhoids. Pertinent negatives include no abdominal pain, anorexia, bloating, diarrhea, difficulty urinating, fecal incontinence, fever, flatus, melena, nausea, rectal pain, vomiting or weight loss. He has tried stool softeners for the symptoms. The treatment provided no relief. His past medical history is significant for radiation treatment.      Review of Systems  Constitutional: Negative.  Negative for fever, chills, weight loss, diaphoresis, appetite change and fatigue.  HENT: Negative.   Eyes: Negative.   Respiratory: Negative.  Negative for cough, choking, chest tightness, shortness of breath and stridor.   Cardiovascular: Negative.  Negative for chest pain, palpitations and leg swelling.  Gastrointestinal: Positive for constipation, blood in stool, hematochezia, anal bleeding and hemorrhoids. Negative for nausea, vomiting, abdominal pain, diarrhea, melena, abdominal distention, rectal pain, bloating, anorexia and flatus.  Endocrine: Negative.   Genitourinary: Negative.  Negative for difficulty urinating.  Musculoskeletal: Positive for back pain. Negative for myalgias, joint swelling, arthralgias, gait problem, neck pain and neck stiffness.  Skin: Negative.  Negative for rash.    Allergic/Immunologic: Negative.   Neurological: Negative.  Negative for dizziness, syncope, speech difficulty, light-headedness, numbness and headaches.  Hematological: Negative.  Negative for adenopathy. Does not bruise/bleed easily.  Psychiatric/Behavioral: Negative.        Objective:   Physical Exam  Constitutional: He is oriented to person, place, and time. He appears well-developed and well-nourished.  Non-toxic appearance. He does not have a sickly appearance. He does not appear ill. No distress.  HENT:  Head: Normocephalic and atraumatic.  Mouth/Throat: Oropharynx is clear and moist. No oropharyngeal exudate.  Eyes: Conjunctivae are normal. Right eye exhibits no discharge. Left eye exhibits no discharge. No scleral icterus.  Neck: Normal range of motion. Neck supple. No tracheal deviation present. No thyromegaly present.  Cardiovascular: Normal rate, regular rhythm, normal heart sounds and intact distal pulses.  Exam reveals no gallop and no friction rub.   No murmur heard. Pulmonary/Chest: Effort normal and breath sounds normal. No stridor. No respiratory distress. He has no wheezes. He has no rales. He exhibits no tenderness.  Abdominal: Soft. Bowel sounds are normal. He exhibits no distension and no mass. There is no tenderness. There is no rebound and no guarding. Hernia confirmed negative in the right inguinal area and confirmed negative in the left inguinal area.  Genitourinary: Prostate normal, testes normal and penis normal. Rectal exam shows external hemorrhoid and internal hemorrhoid. Rectal exam shows no fissure, no mass, no tenderness and anal tone normal. Guaiac positive stool. Prostate is not enlarged and not tender. Right testis shows no mass, no swelling and no tenderness. Right testis is descended. Cremasteric reflex is not absent on the right side. Left testis shows no mass, no swelling and no tenderness. Left testis is descended. Cremasteric reflex is not absent on the  left side. Uncircumcised. No phimosis, paraphimosis,  hypospadias, penile erythema or penile tenderness. No discharge found.  Stool is faintly heme +, there is no BRBPR  Musculoskeletal: Normal range of motion. He exhibits no edema or tenderness.  Lymphadenopathy:    He has no cervical adenopathy.       Right: No inguinal adenopathy present.       Left: No inguinal adenopathy present.  Neurological: He is oriented to person, place, and time.  Skin: Skin is warm and dry. No rash noted. He is not diaphoretic. No erythema. No pallor.  Vitals reviewed.    Lab Results  Component Value Date   WBC 3.4* 12/10/2013   HGB 12.0* 12/10/2013   HCT 35.7* 12/10/2013   PLT 277.0 12/10/2013   GLUCOSE 88 12/05/2013   CHOL 177 12/10/2013   TRIG 92.0 12/10/2013   HDL 33.70* 12/10/2013   LDLCALC 125* 12/10/2013   ALT 19 06/11/2013   AST 16 06/11/2013   NA 136 12/05/2013   K 4.6 12/05/2013   CL 103 12/05/2013   CREATININE 1.4 12/05/2013   BUN 27* 12/05/2013   CO2 23 12/05/2013   TSH 1.02 12/10/2013   PSA 0.09* 12/10/2013   INR 1.02 07/14/2010   HGBA1C 7.9* 12/05/2013       Assessment & Plan:

## 2014-08-03 NOTE — Assessment & Plan Note (Signed)
His BP is well controlled Will monitor his lytes and renal function 

## 2014-08-03 NOTE — Assessment & Plan Note (Signed)
Will refer to GI to see if banding would help

## 2014-08-03 NOTE — Progress Notes (Signed)
   Subjective:    Patient ID: Antonio Adams, male    DOB: [birth year], 69 y.o.   MRN: [id number]  HPI    Review of Systems     Objective:   Physical Exam  Abdominal: Hernia confirmed negative in the right inguinal area and confirmed negative in the left inguinal area.  Genitourinary: Prostate normal, testes normal and penis normal. Rectal exam shows external hemorrhoid and internal hemorrhoid. Rectal exam shows no fissure, no mass, no tenderness and anal tone normal. Guaiac positive stool. Prostate is not enlarged and not tender. Right testis shows no mass, no swelling and no tenderness. Right testis is descended. Left testis shows no mass, no swelling and no tenderness. Left testis is descended. Uncircumcised. No phimosis, paraphimosis, hypospadias, penile erythema or penile tenderness. No discharge found.  Lymphadenopathy:       Right: No inguinal adenopathy present.       Left: No inguinal adenopathy present.      Lab Results  Component Value Date   WBC 3.4* 12/10/2013   HGB 12.0* 12/10/2013   HCT 35.7* 12/10/2013   PLT 277.0 12/10/2013   GLUCOSE 88 12/05/2013   CHOL 177 12/10/2013   TRIG 92.0 12/10/2013   HDL 33.70* 12/10/2013   LDLCALC 125* 12/10/2013   ALT 19 06/11/2013   AST 16 06/11/2013   NA 136 12/05/2013   K 4.6 12/05/2013   CL 103 12/05/2013   CREATININE 1.4 12/05/2013   BUN 27* 12/05/2013   CO2 23 12/05/2013   TSH 1.02 12/10/2013   PSA 0.09* 12/10/2013   INR 1.02 07/14/2010   HGBA1C 7.9* 12/05/2013      Assessment & Plan:

## 2014-08-03 NOTE — Assessment & Plan Note (Signed)
Will check his labs to screen for secondary causes Will try linzess for symptom relief

## 2014-08-03 NOTE — Assessment & Plan Note (Signed)
He is due for an A1C - will address if needed Will also monitor his renal function

## 2014-08-03 NOTE — Progress Notes (Signed)
Pre visit review using our clinic review tool, if applicable. No additional management support is needed unless otherwise documented below in the visit note. 

## 2014-08-04 ENCOUNTER — Encounter: Payer: Self-pay | Admitting: Internal Medicine

## 2014-08-05 ENCOUNTER — Encounter: Payer: Self-pay | Admitting: Internal Medicine

## 2014-08-11 DIAGNOSIS — R350 Frequency of micturition: Secondary | ICD-10-CM | POA: Diagnosis not present

## 2014-08-11 DIAGNOSIS — N3941 Urge incontinence: Secondary | ICD-10-CM | POA: Diagnosis not present

## 2014-08-27 DIAGNOSIS — R32 Unspecified urinary incontinence: Secondary | ICD-10-CM | POA: Diagnosis not present

## 2014-08-27 DIAGNOSIS — R1031 Right lower quadrant pain: Secondary | ICD-10-CM | POA: Diagnosis not present

## 2014-08-27 DIAGNOSIS — G4733 Obstructive sleep apnea (adult) (pediatric): Secondary | ICD-10-CM | POA: Diagnosis not present

## 2014-08-27 DIAGNOSIS — R0600 Dyspnea, unspecified: Secondary | ICD-10-CM | POA: Diagnosis not present

## 2014-09-01 DIAGNOSIS — N3941 Urge incontinence: Secondary | ICD-10-CM | POA: Diagnosis not present

## 2014-09-01 DIAGNOSIS — R350 Frequency of micturition: Secondary | ICD-10-CM | POA: Diagnosis not present

## 2014-09-08 ENCOUNTER — Ambulatory Visit (INDEPENDENT_AMBULATORY_CARE_PROVIDER_SITE_OTHER): Payer: Medicare Other | Admitting: Internal Medicine

## 2014-09-08 ENCOUNTER — Encounter: Payer: Self-pay | Admitting: Internal Medicine

## 2014-09-08 VITALS — BP 122/74 | HR 76 | Ht 70.25 in | Wt 228.5 lb

## 2014-09-08 DIAGNOSIS — D132 Benign neoplasm of duodenum: Secondary | ICD-10-CM

## 2014-09-08 DIAGNOSIS — K625 Hemorrhage of anus and rectum: Secondary | ICD-10-CM | POA: Diagnosis not present

## 2014-09-08 DIAGNOSIS — K627 Radiation proctitis: Secondary | ICD-10-CM | POA: Diagnosis not present

## 2014-09-08 DIAGNOSIS — D500 Iron deficiency anemia secondary to blood loss (chronic): Secondary | ICD-10-CM

## 2014-09-08 NOTE — Progress Notes (Signed)
HISTORY OF PRESENT ILLNESS:  Antonio Adams is a 69 y.o. male with diabetes mellitus, hyperlipidemia, sleep apnea, hypertension, prostate cancer status post seed implantation and external beam radiation, radiation proctitis with chronic intermittent rectal bleeding, adenomatous colon polyps, and history of small carcinoid of the duodenum diagnosed in 2002 monitored endoscopically. The patient was last seen February 2015 or dysphagia. See that dictation. Upper endoscopy with esophageal dilation performed despite no obvious stricture. Stable duodenal carcinoid noted. Patient presents today with a chief complaint of rectal bleeding. He reminds me that this has been present intermittently for 7 years. Generally occurs 2-3 times per month. Fresh blood noted. No incontinent bleeding. No anemia. Previously recommended to take multivitamin with iron, which she does. Review of outside lab work finds most recent hemoglobin 11.5 with normal MCV. This is been within his range over the past several years. The patient denies abdominal pain, change in bowel habits, or rectal pain. He is under the impression that radiation proctitis would heal spontaneously. His last colonoscopy performed June 2013 revealed obvious radiation proctitis. The colon was otherwise normal. He feels that the frequency of bleeding is unchanged though at times the volume is greater  REVIEW OF SYSTEMS:  All non-GI ROS negative except for back pain, depression, night sweats, urinary frequency, urinary leakage, increased urination, insomnia,  Past Medical History  Diagnosis Date  . HYPERLIPIDEMIA 03/16/2007    takes Crestor daily  . CARPAL TUNNEL SYNDROME, BILATERAL 03/16/2007  . ALLERGIC RHINITIS 03/16/2007  . BACK PAIN WITH RADICULOPATHY 11/23/2008  . LEG CRAMPS 05/22/2007  . PARESTHESIA 05/21/2007  . GASTROINTESTINAL HEMORRHAGE, HX OF 03/16/2007  . Overactive bladder   . Hemorrhoids   . HYPERTENSION 03/16/2007    takes Diltiazem and  Lisinopril daily   . Sleep apnea   . Chronic back pain   . Arthritis     back   . GERD 03/16/2007    takes Omeprazole daily  . Rectal bleeding     Dr.Norins has explained its from the Radiation that he has received  . History of colon polyps   . Urinary frequency     takes Toviaz daily  . Nocturia   . DIABETES MELLITUS, TYPE II 03/16/2007    takes Januvia and MEtformin daily  . Constipation     takes Senokot daily  . Glaucoma     mild  . Depression     occasionally  . Proctitis   . ADENOCARCINOMA, PROSTATE, GLEASON GRADE 6 12/21/2009    Past Surgical History  Procedure Laterality Date  . Back surgery  1983, 2008    repeat surgery; ESI '08  . Shoulder surgery  LFT  . Carpal tunnel release      bilateral  . Stress cardiolite  12/24/2003    negative for ischemia  . Wound exploration      management of a wound that he sustained in the [employer]  . Prostate surgery  march 2012    seed implant  . Cervical fusion    . Lumbar laminectomy  10/30/2011    Procedure: MICRODISCECTOMY LUMBAR LAMINECTOMY;  Surgeon: Jessy Oto, MD;  Location: [place];  Service: Orthopedics;  Laterality: Right;  Right L4-5 and L5-S1 Microdiscectomy  . Colonosocpy    . Esophagogastroduodenoscopy      ED  . Lumbar laminectomy  06/17/2012    Procedure: MICRODISCECTOMY LUMBAR LAMINECTOMY;  Surgeon: Jessy Oto, MD;  Location: [place];  Service: Orthopedics;  Laterality: N/A;  Right L5-S1 microdiscectomy  Social History [name]  reports that he has never smoked. He has never used smokeless tobacco. He reports that he does not drink alcohol or use illicit drugs.  family history includes Cancer (age of onset: 35) in his sister; Hypertension in his other. There is no history of Colon cancer, Esophageal cancer, or Stomach cancer.  No Known Allergies     PHYSICAL EXAMINATION: Vital signs: BP 122/74 mmHg  Pulse 76  Ht 5' 10.25" (1.784 m)  Wt 228 lb 8 oz (103.647 kg)  BMI 32.57 kg/m2   Constitutional: generally well-appearing, no acute distress Psychiatric: alert and oriented x3, cooperative Eyes: extraocular movements intact, anicteric, conjunctiva pink Mouth: oral pharynx moist, no lesions Neck: supple no lymphadenopathy Cardiovascular: heart regular rate and rhythm, no murmur Lungs: clear to auscultation bilaterally Abdomen: soft, nontender, nondistended, no obvious ascites, no peritoneal signs, normal bowel sounds, no organomegaly Rectal: No external abnormalities. Good rectal tone. No mass or tenderness. Brown Hemoccult negative stool Extremities: no lower extremity edema bilaterally Skin: no lesions on visible extremities Neuro: No focal deficits.   ASSESSMENT:  #1. Intermittent rectal bleeding secondary to known radiation proctitis. Chronic. Overall seems to be stable. #2. Radiation proctitis on colonoscopy 2013 #3. History of adenomatous colon polyps. Surveillance colonoscopy planned around June 2018 #4. History of small duodenal carcinoid being monitored endoscopically. Due for follow-up 2018 #5. Mild normocytic anemia. Hemoglobin 11.5   PLAN:  #1. Long discussion today regarding the etiology of radiation proctitis as well as its role in rectal bleeding. I discussed with him that there are treatment options such as APC therapy, but this is generally reserve for frequent intractable bleeding, bleeding associated with spontaneous incontinence, or bleeding related anemia. #2. Advised to add over-the-counter iron supplement to his multivitamin with iron #3. Periodic monitoring of blood counts #4. Follow-up endoscopic procedures as outlined above #5. GI follow-up in 1 year. Sooner if needed (I provided parameters)  25 minutes has been face-to-face for this patient's evaluation. Greater than 50% of the time has been used counsel him regarding the complications of radiation proctitis as well as reviewing his previous GI history and surveillance  recommendations  A copy has been sent to Dr. Ronnald Ramp

## 2014-09-08 NOTE — Patient Instructions (Signed)
Please follow up with Dr. Perry as needed 

## 2014-09-14 ENCOUNTER — Telehealth: Payer: Self-pay | Admitting: Internal Medicine

## 2014-09-14 DIAGNOSIS — N3941 Urge incontinence: Secondary | ICD-10-CM

## 2014-09-14 NOTE — Telephone Encounter (Signed)
Patient is requesting script for clotrimazole/betameth cream to be sent to [facility] on [address].  He is also wanting to know if Dr. Ronnald Ramp would sent him in something for overactive bladder.  He has not been seen for this issue.

## 2014-09-15 DIAGNOSIS — R32 Unspecified urinary incontinence: Secondary | ICD-10-CM | POA: Insufficient documentation

## 2014-09-15 MED ORDER — CLOTRIMAZOLE-BETAMETHASONE 1-0.05 % EX CREA: 1.0000 "application " | TOPICAL_CREAM | Freq: Two times a day (BID) | CUTANEOUS | Status: DC

## 2014-09-15 NOTE — Telephone Encounter (Signed)
I think he needs to see a urologist - referral sent

## 2014-09-15 NOTE — Telephone Encounter (Signed)
The cream has been sent in Why does he think he has OAB?

## 2014-09-22 DIAGNOSIS — N3941 Urge incontinence: Secondary | ICD-10-CM | POA: Diagnosis not present

## 2014-09-22 DIAGNOSIS — R350 Frequency of micturition: Secondary | ICD-10-CM | POA: Diagnosis not present

## 2014-09-24 DIAGNOSIS — R3989 Other symptoms and signs involving the genitourinary system: Secondary | ICD-10-CM | POA: Diagnosis not present

## 2014-09-24 DIAGNOSIS — N3281 Overactive bladder: Secondary | ICD-10-CM | POA: Diagnosis not present

## 2014-09-24 DIAGNOSIS — N529 Male erectile dysfunction, unspecified: Secondary | ICD-10-CM | POA: Diagnosis not present

## 2014-10-13 ENCOUNTER — Emergency Department (HOSPITAL_BASED_OUTPATIENT_CLINIC_OR_DEPARTMENT_OTHER)
Admission: EM | Admit: 2014-10-13 | Discharge: 2014-10-13 | Disposition: A | Discharge status: Home / Self Care | Payer: Medicare Other | Attending: Emergency Medicine | Admitting: Emergency Medicine

## 2014-10-13 ENCOUNTER — Other Ambulatory Visit (HOSPITAL_COMMUNITY): Payer: Self-pay | Admitting: Pulmonary Disease

## 2014-10-13 ENCOUNTER — Ambulatory Visit (INDEPENDENT_AMBULATORY_CARE_PROVIDER_SITE_OTHER): Payer: Medicare Other | Admitting: Internal Medicine

## 2014-10-13 ENCOUNTER — Encounter: Payer: Self-pay | Admitting: Internal Medicine

## 2014-10-13 ENCOUNTER — Encounter (HOSPITAL_BASED_OUTPATIENT_CLINIC_OR_DEPARTMENT_OTHER): Payer: Self-pay

## 2014-10-13 VITALS — BP 128/70 | HR 83 | Temp 98.2°F | Resp 16 | Ht 71.0 in | Wt 227.0 lb

## 2014-10-13 DIAGNOSIS — E1165 Type 2 diabetes mellitus with hyperglycemia: Secondary | ICD-10-CM | POA: Insufficient documentation

## 2014-10-13 DIAGNOSIS — I10 Essential (primary) hypertension: Secondary | ICD-10-CM

## 2014-10-13 DIAGNOSIS — Z87448 Personal history of other diseases of urinary system: Secondary | ICD-10-CM | POA: Insufficient documentation

## 2014-10-13 DIAGNOSIS — Z8546 Personal history of malignant neoplasm of prostate: Secondary | ICD-10-CM | POA: Diagnosis not present

## 2014-10-13 DIAGNOSIS — F329 Major depressive disorder, single episode, unspecified: Secondary | ICD-10-CM | POA: Diagnosis not present

## 2014-10-13 DIAGNOSIS — Z7982 Long term (current) use of aspirin: Secondary | ICD-10-CM | POA: Insufficient documentation

## 2014-10-13 DIAGNOSIS — R42 Dizziness and giddiness: Secondary | ICD-10-CM

## 2014-10-13 DIAGNOSIS — K219 Gastro-esophageal reflux disease without esophagitis: Secondary | ICD-10-CM | POA: Insufficient documentation

## 2014-10-13 DIAGNOSIS — M1990 Unspecified osteoarthritis, unspecified site: Secondary | ICD-10-CM | POA: Insufficient documentation

## 2014-10-13 DIAGNOSIS — G8929 Other chronic pain: Secondary | ICD-10-CM | POA: Diagnosis not present

## 2014-10-13 DIAGNOSIS — R739 Hyperglycemia, unspecified: Secondary | ICD-10-CM

## 2014-10-13 DIAGNOSIS — H409 Unspecified glaucoma: Secondary | ICD-10-CM | POA: Insufficient documentation

## 2014-10-13 DIAGNOSIS — E785 Hyperlipidemia, unspecified: Secondary | ICD-10-CM | POA: Insufficient documentation

## 2014-10-13 DIAGNOSIS — Z79899 Other long term (current) drug therapy: Secondary | ICD-10-CM | POA: Insufficient documentation

## 2014-10-13 DIAGNOSIS — Z86010 Personal history of colonic polyps: Secondary | ICD-10-CM | POA: Diagnosis not present

## 2014-10-13 DIAGNOSIS — E118 Type 2 diabetes mellitus with unspecified complications: Secondary | ICD-10-CM

## 2014-10-13 LAB — CBC WITH DIFFERENTIAL/PLATELET
Basophils Absolute: 0 10*3/uL (ref 0.0–0.1)
Basophils Relative: 1 % (ref 0–1)
Eosinophils Absolute: 0.7 10*3/uL (ref 0.0–0.7)
Eosinophils Relative: 14 % — ABNORMAL HIGH (ref 0–5)
HCT: 34.2 % — ABNORMAL LOW (ref 39.0–52.0)
Hemoglobin: 11.8 g/dL — ABNORMAL LOW (ref 13.0–17.0)
Lymphocytes Relative: 30 % (ref 12–46)
Lymphs Abs: 1.5 10*3/uL (ref 0.7–4.0)
MCH: 28.6 pg (ref 26.0–34.0)
MCHC: 34.5 g/dL (ref 30.0–36.0)
MCV: 83 fL (ref 78.0–100.0)
Monocytes Absolute: 0.6 10*3/uL (ref 0.1–1.0)
Monocytes Relative: 12 % (ref 3–12)
Neutro Abs: 2.3 10*3/uL (ref 1.7–7.7)
Neutrophils Relative %: 43 % (ref 43–77)
Platelets: 196 10*3/uL (ref 150–400)
RBC: 4.12 MIL/uL — ABNORMAL LOW (ref 4.22–5.81)
RDW: 13 % (ref 11.5–15.5)
WBC: 5.1 10*3/uL (ref 4.0–10.5)

## 2014-10-13 LAB — URINALYSIS, ROUTINE W REFLEX MICROSCOPIC
Bilirubin Urine: NEGATIVE
Glucose, UA: >1000 mg/dL — AB
Hgb urine dipstick: NEGATIVE
Ketones, ur: NEGATIVE mg/dL
Leukocytes, UA: NEGATIVE
NITRITE: POSITIVE — AB
Protein, ur: NEGATIVE mg/dL
SPECIFIC GRAVITY, URINE: 1.026 (ref 1.005–1.030)
UROBILINOGEN UA: 0.2 mg/dL (ref 0.0–1.0)
pH: 5.5 (ref 5.0–8.0)

## 2014-10-13 LAB — URINE MICROSCOPIC-ADD ON

## 2014-10-13 LAB — BASIC METABOLIC PANEL
Anion gap: 11 (ref 5–15)
BUN: 32 mg/dL — ABNORMAL HIGH (ref 6–20)
CO2: 22 mmol/L (ref 22–32)
Calcium: 10.3 mg/dL (ref 8.9–10.3)
Chloride: 98 mmol/L — ABNORMAL LOW (ref 101–111)
Creatinine, Ser: 1.87 mg/dL — ABNORMAL HIGH (ref 0.61–1.24)
GFR calc Af Amer: 41 mL/min — ABNORMAL LOW (ref >60)
GFR, EST NON AFRICAN AMERICAN: 35 mL/min — AB (ref >60)
Glucose, Bld: 384 mg/dL — ABNORMAL HIGH (ref 65–99)
POTASSIUM: 4.4 mmol/L (ref 3.5–5.1)
SODIUM: 131 mmol/L — AB (ref 135–145)

## 2014-10-13 LAB — CBG MONITORING, ED
Glucose-Capillary: 358 mg/dL — ABNORMAL HIGH (ref 65–99)
Glucose-Capillary: 250 mg/dL — ABNORMAL HIGH (ref 65–99)
Glucose-Capillary: 258 mg/dL — ABNORMAL HIGH (ref 65–99)

## 2014-10-13 LAB — GLUCOSE, POCT (MANUAL RESULT ENTRY): POC Glucose: 252 mg/dl — AB (ref 70–99)

## 2014-10-13 MED ORDER — INSULIN REGULAR HUMAN 100 UNIT/ML IJ SOLN
INTRAMUSCULAR | Status: AC
  Filled 2014-10-13: qty 1

## 2014-10-13 MED ORDER — SITAGLIP PHOS-METFORMIN HCL ER 50-1000 MG PO TB24: 1.0000 | ORAL_TABLET | Freq: Every day | ORAL | Status: DC

## 2014-10-13 MED ORDER — INSULIN REGULAR HUMAN 100 UNIT/ML IJ SOLN
INTRAMUSCULAR | Status: DC
  Administered 2014-10-13: 3 [IU]/h via INTRAVENOUS

## 2014-10-13 MED ORDER — INSULIN DEGLUDEC 200 UNIT/ML ~~LOC~~ SOPN: 20.0000 [IU] | PEN_INJECTOR | Freq: Every day | SUBCUTANEOUS | Status: DC

## 2014-10-13 MED ORDER — INSULIN REGULAR HUMAN 100 UNIT/ML IJ SOLN
10.0000 [IU] | Freq: Once | INTRAMUSCULAR | Status: AC
  Administered 2014-10-13: 10 [IU] via SUBCUTANEOUS
  Filled 2014-10-13: qty 1

## 2014-10-13 MED ORDER — ACETAMINOPHEN 500 MG PO TABS
1000.0000 mg | ORAL_TABLET | Freq: Once | ORAL | Status: AC
  Administered 2014-10-13: 1000 mg via ORAL
  Filled 2014-10-13: qty 2

## 2014-10-13 MED ORDER — SODIUM CHLORIDE 0.9 % IV SOLN
Freq: Once | INTRAVENOUS | Status: AC
  Administered 2014-10-13: 1000 mL via INTRAVENOUS

## 2014-10-13 MED ORDER — SODIUM CHLORIDE 0.9 % IV BOLUS (SEPSIS)
1000.0000 mL | Freq: Once | INTRAVENOUS | Status: AC
  Administered 2014-10-13: 1000 mL via INTRAVENOUS

## 2014-10-13 NOTE — ED Provider Notes (Signed)
CSN: [id number]     Arrival date & time 10/13/14  0321 History   First MD Initiated Contact with Patient 10/13/14 [phone]     Chief Complaint  Patient presents with  . Hyperglycemia     (Consider location/radiation/quality/duration/timing/severity/associated sxs/prior Treatment) HPI  This is a 69 year old male with type 2 diabetes. He is here with several days of polydipsia, polyuria and generalized fatigue which led him to believe he was hyperglycemic. His sugar was noted to be 358 on arrival. He denies fever, chills, chest pain, shortness of breath, nausea, vomiting or diarrhea. He complains of a dry mouth and occasional cough. He has been taking his regular at a hypoglycemics. He does not take insulin.  Past Medical History  Diagnosis Date  . HYPERLIPIDEMIA 03/16/2007    takes Crestor daily  . CARPAL TUNNEL SYNDROME, BILATERAL 03/16/2007  . ALLERGIC RHINITIS 03/16/2007  . BACK PAIN WITH RADICULOPATHY 11/23/2008  . LEG CRAMPS 05/22/2007  . PARESTHESIA 05/21/2007  . GASTROINTESTINAL HEMORRHAGE, HX OF 03/16/2007  . Overactive bladder   . Hemorrhoids   . HYPERTENSION 03/16/2007    takes Diltiazem and Lisinopril daily   . Sleep apnea   . Chronic back pain   . Arthritis     back   . GERD 03/16/2007    takes Omeprazole daily  . Rectal bleeding     Dr.Norins has explained its from the Radiation that he has received  . History of colon polyps   . Urinary frequency     takes Toviaz daily  . Nocturia   . DIABETES MELLITUS, TYPE II 03/16/2007    takes Januvia and MEtformin daily  . Constipation     takes Senokot daily  . Glaucoma     mild  . Depression     occasionally  . Proctitis   . ADENOCARCINOMA, PROSTATE, GLEASON GRADE 6 12/21/2009   Past Surgical History  Procedure Laterality Date  . Back surgery  1983, 2008    repeat surgery; ESI '08  . Shoulder surgery  LFT  . Carpal tunnel release      bilateral  . Stress cardiolite  12/24/2003    negative for ischemia  . Wound exploration       management of a wound that he sustained in the [employer]  . Prostate surgery  march 2012    seed implant  . Cervical fusion    . Lumbar laminectomy  10/30/2011    Procedure: MICRODISCECTOMY LUMBAR LAMINECTOMY;  Surgeon: Jessy Oto, MD;  Location: [place];  Service: Orthopedics;  Laterality: Right;  Right L4-5 and L5-S1 Microdiscectomy  . Colonosocpy    . Esophagogastroduodenoscopy      ED  . Lumbar laminectomy  06/17/2012    Procedure: MICRODISCECTOMY LUMBAR LAMINECTOMY;  Surgeon: Jessy Oto, MD;  Location: [place];  Service: Orthopedics;  Laterality: N/A;  Right L5-S1 microdiscectomy   Family History  Problem Relation Age of Onset  . Cancer Sister 30    Cancer  . Hypertension Other   . Colon cancer Neg Hx   . Esophageal cancer Neg Hx   . Stomach cancer Neg Hx    History  Substance Use Topics  . Smoking status: Never Smoker   . Smokeless tobacco: Never Used  . Alcohol Use: No    Review of Systems  All other systems reviewed and are negative.   Allergies  Review of patient's allergies indicates no known allergies.  Home Medications   Prior to Admission medications   Medication Sig  Start Date End Date Taking? Authorizing Provider  phenazopyridine (PYRIDIUM) 100 MG tablet Take 100 mg by mouth 3 (three) times daily as needed for pain.   Yes Historical Provider, MD  aspirin EC 81 MG tablet Take 81 mg by mouth daily.    Historical Provider, MD  atorvastatin (LIPITOR) 20 MG tablet Take 1 tablet (20 mg total) by mouth daily. 12/10/13   [name], MD  Cholecalciferol 1000 UNITS capsule Take 1,000 Units by mouth daily.     Historical Provider, MD  clonazePAM (KLONOPIN) 2 MG tablet Take 5 mg by mouth daily.    Historical Provider, MD  clotrimazole-betamethasone (LOTRISONE) cream Apply 1 application topically 2 (two) times daily. 09/15/14   [name], MD  diltiazem (DILACOR XR) 120 MG 24 hr capsule Take 120 mg by mouth daily.      Historical Provider, MD  gabapentin  (NEURONTIN) 100 MG capsule Take 1 capsule (100 mg total) by mouth at bedtime. 06/09/13   Neena Rhymes, MD  gemfibrozil (LOPID) 600 MG tablet Take 600 mg by mouth 2 (two) times daily.     Historical Provider, MD  glipiZIDE (GLUCOTROL) 10 MG tablet Take 10 mg by mouth daily before breakfast.    Historical Provider, MD  glucose blood (ONE TOUCH ULTRA TEST) test strip 1 each by Other route 2 (two) times daily. And lancets 2/day 250.03 10/13/11   Renato Shin, MD  ibuprofen (ADVIL,MOTRIN) 800 MG tablet Take 1 tablet (800 mg total) by mouth every 8 (eight) hours as needed. 08/03/14   [name], MD  lisinopril (PRINIVIL,ZESTRIL) 40 MG tablet Take 20 mg by mouth daily.     Historical Provider, MD  metFORMIN (GLUCOPHAGE) 500 MG tablet Take 500 mg by mouth 2 (two) times daily with a meal.    Historical Provider, MD  multivitamin-iron-minerals-folic acid (CENTRUM) chewable tablet Chew 1 tablet by mouth daily.    Historical Provider, MD  Omega-3 Fatty Acids ([facility] FISH OIL PO) Take 1 capsule by mouth daily.     Historical Provider, MD  omeprazole (PRILOSEC) 40 MG capsule TAKE ONE CAPSULE EVERY DAY 02/24/13   Neena Rhymes, MD  pioglitazone (ACTOS) 15 MG tablet Take 1 tablet (15 mg total) by mouth daily. Okay for generic 03/28/13   Neena Rhymes, MD  sertraline (ZOLOFT) 100 MG tablet Take 100 tablets by mouth daily. 11/23/12   Historical Provider, MD  tamsulosin (FLOMAX) 0.4 MG CAPS capsule TAKE 1 CAPSULE (0.4 MG TOTAL) BY MOUTH DAILY. 01/20/14   [name], MD  zolpidem (AMBIEN) 10 MG tablet Take 10 mg by mouth at bedtime as needed.  07/07/12   Historical Provider, MD   BP 139/89 mmHg  Pulse 92  Temp(Src) 99 F (37.2 C) (Oral)  Resp 20  Ht 5\' 11"  (1.803 m)  Wt 230 lb (104.327 kg)  BMI 32.09 kg/m2  SpO2 97%   Physical Exam  General: Well-developed, well-nourished male in no acute distress; appearance consistent with age of record HENT: normocephalic; atraumatic Eyes: pupils equal, round and  reactive to light; extraocular muscles intact; arcus senilis bilaterally Neck: supple Heart: regular rate and rhythm; no murmurs, rubs or gallops Lungs: clear to auscultation bilaterally Abdomen: soft; nondistended; nontender; no masses or hepatosplenomegaly; bowel sounds present Extremities: No deformity; full range of motion; pulses normal Neurologic: Awake, alert and oriented; motor function intact in all extremities and symmetric; no facial droop Skin: Warm and dry Psychiatric: Normal mood and affect   ED Course  Procedures (including critical care time)   MDM   Nursing notes and vitals signs, including pulse oximetry, reviewed.  Summary of this visit's results, reviewed by myself:  Labs:  Results for orders placed or performed during the hospital encounter of 10/13/14 (from the past 24 hour(s))  CBG monitoring, ED     Status: Abnormal   Collection Time: 10/13/14  3:27 AM  Result Value Ref Range   Glucose-Capillary 358 (H) 65 - 99 mg/dL  Basic metabolic panel     Status: Abnormal   Collection Time: 10/13/14  3:40 AM  Result Value Ref Range   Sodium 131 (L) 135 - 145 mmol/L   Potassium 4.4 3.5 - 5.1 mmol/L   Chloride 98 (L) 101 - 111 mmol/L   CO2 22 22 - 32 mmol/L   Glucose, Bld 384 (H) 65 - 99 mg/dL   BUN 32 (H) 6 - 20 mg/dL   Creatinine, Ser 1.87 (H) 0.61 - 1.24 mg/dL   Calcium 10.3 8.9 - 10.3 mg/dL   GFR calc non Af Amer 35 (L) >60 mL/min   GFR calc Af Amer 41 (L) >60 mL/min   Anion gap 11 5 - 15  CBC with Differential/Platelet     Status: Abnormal   Collection Time: 10/13/14  3:40 AM  Result Value Ref Range   WBC 5.1 4.0 - 10.5 K/uL   RBC 4.12 (L) 4.22 - 5.81 MIL/uL   Hemoglobin 11.8 (L) 13.0 - 17.0 g/dL   HCT 34.2 (L) 39.0 - 52.0 %   MCV 83.0 78.0 - 100.0 fL   MCH 28.6 26.0 - 34.0 pg   MCHC 34.5 30.0 - 36.0 g/dL   RDW 13.0 11.5 - 15.5 %   Platelets 196 150 - 400 K/uL   Neutrophils Relative % 43 43 - 77 %   Neutro Abs 2.3 1.7 - 7.7 K/uL   Lymphocytes  Relative 30 12 - 46 %   Lymphs Abs 1.5 0.7 - 4.0 K/uL   Monocytes Relative 12 3 - 12 %   Monocytes Absolute 0.6 0.1 - 1.0 K/uL   Eosinophils Relative 14 (H) 0 - 5 %   Eosinophils Absolute 0.7 0.0 - 0.7 K/uL   Basophils Relative 1 0 - 1 %   Basophils Absolute 0.0 0.0 - 0.1 K/uL  Urinalysis, Routine w reflex microscopic     Status: Abnormal   Collection Time: 10/13/14  4:40 AM  Result Value Ref Range   Color, Urine ORANGE (A) YELLOW   APPearance CLEAR CLEAR   Specific Gravity, Urine 1.026 1.005 - 1.030   pH 5.5 5.0 - 8.0   Glucose, UA >1000 (A) NEGATIVE mg/dL   Hgb urine dipstick NEGATIVE NEGATIVE   Bilirubin Urine NEGATIVE NEGATIVE   Ketones, ur NEGATIVE NEGATIVE mg/dL   Protein, ur NEGATIVE NEGATIVE mg/dL   Urobilinogen, UA 0.2 0.0 - 1.0 mg/dL   Nitrite POSITIVE (A) NEGATIVE   Leukocytes, UA NEGATIVE NEGATIVE  Urine microscopic-add on     Status: None   Collection Time: 10/13/14  4:40 AM  Result Value Ref Range   Squamous Epithelial / LPF RARE RARE   WBC, UA 0-2 <3 WBC/hpf   RBC / HPF 0-2 <3 RBC/hpf   Bacteria, UA RARE RARE  CBG monitoring, ED     Status: Abnormal   Collection Time: 10/13/14  5:14 AM  Result Value Ref Range   Glucose-Capillary 250 (H) 65 - 99 mg/dL  CBG monitoring, ED     Status: Abnormal   Collection  Time: 10/13/14  6:20 AM  Result Value Ref Range   Glucose-Capillary 258 (H) 65 - 99 mg/dL   6:30 AM The patient has been rehydrated. He has been on insulin drip per Glucose Stabilizer. Sugars are hovering in the mid 250s at this time. We will give him some subcutaneous insulin and discharge him home. He advises that he has been recently treated for a urinary tract infection, and is currently on anti-biotic. He was advised that it is likely his UTI or medications destabilized his glycemic control. He was advised to contact his PCP today for further evaluation and treatment.  Shanon Rosser, MD 10/13/14 [phone]

## 2014-10-13 NOTE — Discharge Instructions (Signed)

## 2014-10-13 NOTE — Progress Notes (Signed)
Pre visit review using our clinic review tool, if applicable. No additional management support is needed unless otherwise documented below in the visit note. 

## 2014-10-13 NOTE — Patient Instructions (Signed)

## 2014-10-13 NOTE — ED Notes (Signed)
Pt c/o increase thirst, urination and dry mouth for past 3days; states complaint with meds

## 2014-10-13 NOTE — Progress Notes (Signed)
Subjective:  Patient ID: Antonio Adams, male    DOB: 15-Sep-1945  Age: 69 y.o. MRN: [id number]  CC: Hypertension and Diabetes   HPI GOODMAN TOENNIES presents for high blood sugars, over the last few days his blood sugars have been as high as 480, he was seen in the ER earlier today and was referred here for f/up, he reports poly's as well.  Outpatient Prescriptions Prior to Visit  Medication Sig Dispense Refill  . aspirin EC 81 MG tablet Take 81 mg by mouth daily.    Marland Kitchen atorvastatin (LIPITOR) 20 MG tablet Take 1 tablet (20 mg total) by mouth daily. 90 tablet 3  . Cholecalciferol 1000 UNITS capsule Take 1,000 Units by mouth daily.     . clonazePAM (KLONOPIN) 2 MG tablet Take 5 mg by mouth daily.    . clotrimazole-betamethasone (LOTRISONE) cream Apply 1 application topically 2 (two) times daily. 30 g 0  . diltiazem (DILACOR XR) 120 MG 24 hr capsule Take 120 mg by mouth daily.      Marland Kitchen gabapentin (NEURONTIN) 100 MG capsule Take 1 capsule (100 mg total) by mouth at bedtime. 30 capsule 11  . gemfibrozil (LOPID) 600 MG tablet Take 600 mg by mouth 2 (two) times daily.     Marland Kitchen glipiZIDE (GLUCOTROL) 10 MG tablet Take 10 mg by mouth daily before breakfast.    . glucose blood (ONE TOUCH ULTRA TEST) test strip 1 each by Other route 2 (two) times daily. And lancets 2/day 250.03 100 each 12  . lisinopril (PRINIVIL,ZESTRIL) 40 MG tablet Take 20 mg by mouth daily.     . multivitamin-iron-minerals-folic acid (CENTRUM) chewable tablet Chew 1 tablet by mouth daily.    . Omega-3 Fatty Acids ([facility] FISH OIL PO) Take 1 capsule by mouth daily.     Marland Kitchen omeprazole (PRILOSEC) 40 MG capsule TAKE ONE CAPSULE EVERY DAY 30 capsule 10  . phenazopyridine (PYRIDIUM) 100 MG tablet Take 100 mg by mouth 3 (three) times daily as needed for pain.    . pioglitazone (ACTOS) 15 MG tablet Take 1 tablet (15 mg total) by mouth daily. Okay for generic 30 tablet 5  . sertraline (ZOLOFT) 100 MG tablet Take 100 tablets by mouth daily.     . tamsulosin (FLOMAX) 0.4 MG CAPS capsule TAKE 1 CAPSULE (0.4 MG TOTAL) BY MOUTH DAILY. 30 capsule 1  . zolpidem (AMBIEN) 10 MG tablet Take 10 mg by mouth at bedtime as needed.     Marland Kitchen ibuprofen (ADVIL,MOTRIN) 800 MG tablet Take 1 tablet (800 mg total) by mouth every 8 (eight) hours as needed. 65 tablet 3  . metFORMIN (GLUCOPHAGE) 500 MG tablet Take 500 mg by mouth 2 (two) times daily with a meal.     No facility-administered medications prior to visit.    ROS Review of Systems  Constitutional: Negative.  Negative for fever, chills, diaphoresis, appetite change and fatigue.  HENT: Negative.   Eyes: Negative.   Respiratory: Negative.  Negative for cough, choking, chest tightness, shortness of breath and stridor.   Cardiovascular: Negative.  Negative for chest pain, palpitations and leg swelling.  Gastrointestinal: Positive for constipation. Negative for nausea, vomiting, abdominal pain, diarrhea and blood in stool.  Endocrine: Positive for polydipsia, polyphagia and polyuria.  Genitourinary: Negative.   Musculoskeletal: Negative.  Negative for myalgias, back pain, joint swelling and arthralgias.  Skin: Negative.  Negative for rash.  Allergic/Immunologic: Negative.   Neurological: Negative.  Negative for dizziness, syncope, speech difficulty, weakness, light-headedness and numbness.  Hematological: Negative.  Negative for adenopathy. Does not bruise/bleed easily.  Psychiatric/Behavioral: Negative.     Objective:  BP 128/70 mmHg  Pulse 83  Temp(Src) 98.2 F (36.8 C) (Oral)  Resp 16  Ht 5\' 11"  (1.803 m)  Wt 227 lb (102.967 kg)  BMI 31.67 kg/m2  SpO2 95%  BP Readings from Last 3 Encounters:  10/13/14 128/70  10/13/14 140/82  09/08/14 122/74    Wt Readings from Last 3 Encounters:  10/13/14 227 lb (102.967 kg)  10/13/14 230 lb (104.327 kg)  09/08/14 228 lb 8 oz (103.647 kg)    Physical Exam  Constitutional: He is oriented to person, place, and time. He appears  well-developed and well-nourished. No distress.  HENT:  Head: Normocephalic and atraumatic.  Mouth/Throat: Oropharynx is clear and moist. No oropharyngeal exudate.  Eyes: Conjunctivae are normal. Right eye exhibits no discharge. Left eye exhibits no discharge. No scleral icterus.  Neck: Normal range of motion. Neck supple. No JVD present. No tracheal deviation present. No thyromegaly present.  Cardiovascular: Normal rate, regular rhythm, normal heart sounds and intact distal pulses.  Exam reveals no gallop and no friction rub.   No murmur heard. Pulmonary/Chest: Effort normal and breath sounds normal. No stridor. No respiratory distress. He has no wheezes. He has no rales. He exhibits no tenderness.  Abdominal: Soft. Bowel sounds are normal. He exhibits no distension and no mass. There is no tenderness. There is no rebound and no guarding.  Musculoskeletal: Normal range of motion. He exhibits no edema.  Lymphadenopathy:    He has no cervical adenopathy.  Neurological: He is oriented to person, place, and time.  Skin: Skin is warm and dry. No rash noted. He is not diaphoretic. No erythema. No pallor.  Vitals reviewed.   Lab Results  Component Value Date   WBC 5.1 10/13/2014   HGB 11.8* 10/13/2014   HCT 34.2* 10/13/2014   PLT 196 10/13/2014   GLUCOSE 384* 10/13/2014   CHOL 150 08/03/2014   TRIG 110.0 08/03/2014   HDL 30.50* 08/03/2014   LDLCALC 98 08/03/2014   ALT 19 08/03/2014   AST 19 08/03/2014   NA 131* 10/13/2014   K 4.4 10/13/2014   CL 98* 10/13/2014   CREATININE 1.87* 10/13/2014   BUN 32* 10/13/2014   CO2 22 10/13/2014   TSH 0.73 08/03/2014   PSA 0.09* 12/10/2013   INR 1.02 07/14/2010   HGBA1C 8.9* 08/03/2014   MICROALBUR 7.5* 08/03/2014    No results found.  Assessment & Plan:   Gwen was seen today for hypertension and diabetes.  Diagnoses and all orders for this visit:  Type II diabetes mellitus with manifestations - he has recently has blood sugars >400  so I have asked him to start a basal insulin, will change his metformin to janumet-xr but will keep the metformin dose low due to the creatinine level from today's labs done in the ER, will cont the other meds for DM2, I have also asked him to see ENDO and diabetic education  Orders: -     Ambulatory referral to Endocrinology -     Amb Referral to Nutrition and Diabetic E -     Insulin Degludec (TRESIBA FLEXTOUCH) 200 UNIT/ML SOPN; Inject 20 Units into the skin daily. -     SitaGLIPtin-MetFORMIN HCl (JANUMET XR) 50-1000 MG TB24; Take 1 tablet by mouth daily.  Essential hypertension - his BP is well controled, his creatinine is up some so I have asked him to stop all nsaids  I have discontinued Mr. Otanez ibuprofen and metFORMIN. I am also having him start on Insulin Degludec and SitaGLIPtin-MetFORMIN HCl. Additionally, I am having him maintain his diltiazem, gemfibrozil, lisinopril, Cholecalciferol, glucose blood, zolpidem, sertraline, omeprazole, pioglitazone, gabapentin, Omega-3 Fatty Acids ([facility] FISH OIL PO), aspirin EC, atorvastatin, multivitamin-iron-minerals-folic acid, tamsulosin, clonazePAM, glipiZIDE, clotrimazole-betamethasone, and phenazopyridine.  Meds ordered this encounter  Medications  . Insulin Degludec (TRESIBA FLEXTOUCH) 200 UNIT/ML SOPN    Sig: Inject 20 Units into the skin daily.    Dispense:  3 mL    Refill:  0  . SitaGLIPtin-MetFORMIN HCl (JANUMET XR) 50-1000 MG TB24    Sig: Take 1 tablet by mouth daily.    Dispense:  30 tablet    Refill:  11     Follow-up: Return in about 3 weeks (around 11/03/2014).  Scarlette Calico, MD

## 2014-10-14 ENCOUNTER — Ambulatory Visit (HOSPITAL_COMMUNITY)
Admission: RE | Admit: 2014-10-14 | Discharge: 2014-10-14 | Disposition: A | Discharge status: Home / Self Care | Payer: Medicare Other | Source: Ambulatory Visit | Attending: Pulmonary Disease | Admitting: Pulmonary Disease

## 2014-10-14 DIAGNOSIS — R42 Dizziness and giddiness: Secondary | ICD-10-CM | POA: Diagnosis not present

## 2014-10-16 ENCOUNTER — Other Ambulatory Visit: Payer: Self-pay

## 2014-10-16 MED ORDER — GLUCOSE BLOOD VI STRP: 1.0000 | ORAL_STRIP | Freq: Two times a day (BID) | Status: DC

## 2014-10-20 DIAGNOSIS — N3941 Urge incontinence: Secondary | ICD-10-CM | POA: Diagnosis not present

## 2014-10-23 ENCOUNTER — Encounter: Payer: Self-pay | Admitting: Internal Medicine

## 2014-11-04 ENCOUNTER — Encounter: Payer: Self-pay | Admitting: Internal Medicine

## 2014-11-04 ENCOUNTER — Telehealth: Payer: Self-pay | Admitting: Internal Medicine

## 2014-11-04 ENCOUNTER — Other Ambulatory Visit (INDEPENDENT_AMBULATORY_CARE_PROVIDER_SITE_OTHER): Payer: Medicare Other

## 2014-11-04 ENCOUNTER — Other Ambulatory Visit: Payer: Self-pay

## 2014-11-04 ENCOUNTER — Ambulatory Visit (INDEPENDENT_AMBULATORY_CARE_PROVIDER_SITE_OTHER): Payer: Medicare Other | Admitting: Internal Medicine

## 2014-11-04 VITALS — BP 110/70 | HR 85 | Temp 98.4°F | Resp 16 | Ht 71.0 in | Wt 222.5 lb

## 2014-11-04 DIAGNOSIS — E118 Type 2 diabetes mellitus with unspecified complications: Secondary | ICD-10-CM

## 2014-11-04 DIAGNOSIS — I10 Essential (primary) hypertension: Secondary | ICD-10-CM | POA: Diagnosis not present

## 2014-11-04 LAB — HEMOGLOBIN A1C: HEMOGLOBIN A1C: 10.7 % — AB (ref 4.6–6.5)

## 2014-11-04 LAB — URINALYSIS, ROUTINE W REFLEX MICROSCOPIC
Bilirubin Urine: NEGATIVE
Hgb urine dipstick: NEGATIVE
KETONES UR: NEGATIVE
Leukocytes, UA: NEGATIVE
Nitrite: NEGATIVE
PH: 6 (ref 5.0–8.0)
RBC / HPF: NONE SEEN (ref 0–?)
SPECIFIC GRAVITY, URINE: 1.02 (ref 1.000–1.030)
TOTAL PROTEIN, URINE-UPE24: NEGATIVE
URINE GLUCOSE: 100 — AB
Urobilinogen, UA: 0.2 (ref 0.0–1.0)
WBC, UA: NONE SEEN (ref 0–?)

## 2014-11-04 LAB — BASIC METABOLIC PANEL
BUN: 32 mg/dL — AB (ref 6–23)
CO2: 23 mEq/L (ref 19–32)
CREATININE: 1.79 mg/dL — AB (ref 0.40–1.50)
Calcium: 10.2 mg/dL (ref 8.4–10.5)
Chloride: 102 mEq/L (ref 96–112)
GFR: 48.73 mL/min — AB (ref >60.00)
Glucose, Bld: 203 mg/dL — ABNORMAL HIGH (ref 70–99)
POTASSIUM: 4.4 meq/L (ref 3.5–5.1)
Sodium: 133 mEq/L — ABNORMAL LOW (ref 135–145)

## 2014-11-04 MED ORDER — INSULIN GLARGINE 100 UNIT/ML ~~LOC~~ SOLN: 40.0000 [IU] | Freq: Every day | SUBCUTANEOUS | Status: DC

## 2014-11-04 MED ORDER — NEEDLES & SYRINGES MISC: Status: DC

## 2014-11-04 NOTE — Telephone Encounter (Signed)
Called pharmacy and rx clarification given. Added syringe and needle to request. Abbe Amsterdam stated that they are probably covered under ins.

## 2014-11-04 NOTE — Patient Instructions (Signed)

## 2014-11-04 NOTE — Telephone Encounter (Signed)
Script came over as 3 on lantis needs to know what 3 means.

## 2014-11-04 NOTE — Progress Notes (Signed)
Pre visit review using our clinic review tool, if applicable. No additional management support is needed unless otherwise documented below in the visit note. 

## 2014-11-04 NOTE — Progress Notes (Signed)
Subjective:  Patient ID: Antonio Adams, male    DOB: [birth year]  Age: 69 y.o. MRN: [id number]  CC: Hypertension and Diabetes   HPI Antonio Adams presents for follow up on DM 2 and HTN - he tells me that his blood sugars have improved and all CBG's done by him at home over the last week have been <200. He offers no complaints today.  Outpatient Prescriptions Prior to Visit  Medication Sig Dispense Refill  . aspirin EC 81 MG tablet Take 81 mg by mouth daily.    Marland Kitchen atorvastatin (LIPITOR) 20 MG tablet Take 1 tablet (20 mg total) by mouth daily. 90 tablet 3  . Cholecalciferol 1000 UNITS capsule Take 1,000 Units by mouth daily.     . clonazePAM (KLONOPIN) 2 MG tablet Take 5 mg by mouth daily.    . clotrimazole-betamethasone (LOTRISONE) cream Apply 1 application topically 2 (two) times daily. 30 g 0  . diltiazem (DILACOR XR) 120 MG 24 hr capsule Take 120 mg by mouth daily.      Marland Kitchen gabapentin (NEURONTIN) 100 MG capsule Take 1 capsule (100 mg total) by mouth at bedtime. 30 capsule 11  . gemfibrozil (LOPID) 600 MG tablet Take 600 mg by mouth 2 (two) times daily.     Marland Kitchen glipiZIDE (GLUCOTROL) 10 MG tablet Take 10 mg by mouth daily before breakfast.    . glucose blood (ONE TOUCH ULTRA TEST) test strip 1 each by Other route 2 (two) times daily. And lancets 2/day 250.03 100 each 12  . lisinopril (PRINIVIL,ZESTRIL) 40 MG tablet Take 20 mg by mouth daily.     . multivitamin-iron-minerals-folic acid (CENTRUM) chewable tablet Chew 1 tablet by mouth daily.    . Omega-3 Fatty Acids ([facility] FISH OIL PO) Take 1 capsule by mouth daily.     Marland Kitchen omeprazole (PRILOSEC) 40 MG capsule TAKE ONE CAPSULE EVERY DAY 30 capsule 10  . phenazopyridine (PYRIDIUM) 100 MG tablet Take 100 mg by mouth 3 (three) times daily as needed for pain.    . pioglitazone (ACTOS) 15 MG tablet Take 1 tablet (15 mg total) by mouth daily. Okay for generic 30 tablet 5  . sertraline (ZOLOFT) 100 MG tablet Take 100 tablets by mouth daily.      . SitaGLIPtin-MetFORMIN HCl (JANUMET XR) 50-1000 MG TB24 Take 1 tablet by mouth daily. 30 tablet 11  . tamsulosin (FLOMAX) 0.4 MG CAPS capsule TAKE 1 CAPSULE (0.4 MG TOTAL) BY MOUTH DAILY. 30 capsule 1  . zolpidem (AMBIEN) 10 MG tablet Take 10 mg by mouth at bedtime as needed.     . Insulin Degludec (TRESIBA FLEXTOUCH) 200 UNIT/ML SOPN Inject 20 Units into the skin daily. 3 mL 0   No facility-administered medications prior to visit.    ROS Review of Systems  Constitutional: Negative.  Negative for fever, chills, diaphoresis, appetite change and fatigue.  HENT: Negative.   Eyes: Negative.   Respiratory: Negative.  Negative for cough, choking, chest tightness, shortness of breath and stridor.   Cardiovascular: Negative.  Negative for chest pain, palpitations and leg swelling.  Gastrointestinal: Negative.  Negative for nausea, vomiting, abdominal pain, diarrhea, constipation and blood in stool.  Endocrine: Negative.  Negative for polydipsia, polyphagia and polyuria.  Genitourinary: Negative.   Musculoskeletal: Negative.  Negative for myalgias, back pain, joint swelling and arthralgias.  Skin: Negative.  Negative for rash.  Allergic/Immunologic: Negative.   Neurological: Negative.  Negative for dizziness, tremors, syncope, light-headedness and numbness.  Hematological: Negative.  Negative for  adenopathy. Does not bruise/bleed easily.  Psychiatric/Behavioral: Negative.     Objective:  BP 110/70 mmHg  Pulse 85  Temp(Src) 98.4 F (36.9 C) (Oral)  Ht 5\' 11"  (1.803 m)  Wt 222 lb 8 oz (100.925 kg)  BMI 31.05 kg/m2  SpO2 95%  BP Readings from Last 3 Encounters:  11/04/14 110/70  10/13/14 128/70  10/13/14 140/82    Wt Readings from Last 3 Encounters:  11/04/14 222 lb 8 oz (100.925 kg)  10/13/14 227 lb (102.967 kg)  10/13/14 230 lb (104.327 kg)    Physical Exam  Constitutional: He is oriented to person, place, and time. He appears well-developed and well-nourished. No distress.   HENT:  Head: Normocephalic and atraumatic.  Mouth/Throat: Oropharynx is clear and moist. No oropharyngeal exudate.  Eyes: Conjunctivae are normal. Right eye exhibits no discharge. Left eye exhibits no discharge. No scleral icterus.  Neck: Normal range of motion. Neck supple. No JVD present. No tracheal deviation present. No thyromegaly present.  Cardiovascular: Normal rate, regular rhythm, normal heart sounds and intact distal pulses.  Exam reveals no gallop and no friction rub.   No murmur heard. Pulmonary/Chest: Effort normal and breath sounds normal. No stridor. No respiratory distress. He has no wheezes. He has no rales. He exhibits no tenderness.  Abdominal: Soft. Bowel sounds are normal. He exhibits no distension and no mass. There is no tenderness. There is no rebound and no guarding.  Musculoskeletal: Normal range of motion. He exhibits no edema or tenderness.  Lymphadenopathy:    He has no cervical adenopathy.  Neurological: He is oriented to person, place, and time.  Skin: Skin is warm and dry. No rash noted. He is not diaphoretic. No erythema. No pallor.  Psychiatric: He has a normal mood and affect. His behavior is normal. Judgment and thought content normal.  Vitals reviewed.   Lab Results  Component Value Date   WBC 5.1 10/13/2014   HGB 11.8* 10/13/2014   HCT 34.2* 10/13/2014   PLT 196 10/13/2014   GLUCOSE 203* 11/04/2014   CHOL 150 08/03/2014   TRIG 110.0 08/03/2014   HDL 30.50* 08/03/2014   LDLCALC 98 08/03/2014   ALT 19 08/03/2014   AST 19 08/03/2014   NA 133* 11/04/2014   K 4.4 11/04/2014   CL 102 11/04/2014   CREATININE 1.79* 11/04/2014   BUN 32* 11/04/2014   CO2 23 11/04/2014   TSH 0.73 08/03/2014   PSA 0.09* 12/10/2013   INR 1.02 07/14/2010   HGBA1C 10.7* 11/04/2014   MICROALBUR 7.5* 08/03/2014    No results found.  Assessment & Plan:   Antonio Adams was seen today for hypertension and diabetes.  Diagnoses and all orders for this visit:  Type II  diabetes mellitus with manifestations - his A1C is very high, will refer to ENDO and for diab education, insurance will not cover tresiba so will change to lantus as requested, will cont the oral meds without change for now Orders: -     Basic metabolic panel; Future -     Hemoglobin A1c; Future -     Ambulatory referral to Endocrinology -     insulin glargine (LANTUS) 100 UNIT/ML injection; Inject 0.4 mLs (40 Units total) into the skin at bedtime.  Essential hypertension - his BO is well controled, renal function has declined slightly but I attribute this to the hyperglycemia, will maintain tight BP control Orders: -     Basic metabolic panel; Future -     Urinalysis, Routine w reflex microscopic (  not at [facility]); Future   I have changed Antonio Adams Insulin Degludec to insulin glargine. I am also having him maintain his diltiazem, gemfibrozil, lisinopril, Cholecalciferol, zolpidem, sertraline, omeprazole, pioglitazone, gabapentin, Omega-3 Fatty Acids ([facility] FISH OIL PO), aspirin EC, atorvastatin, multivitamin-iron-minerals-folic acid, tamsulosin, clonazePAM, glipiZIDE, clotrimazole-betamethasone, phenazopyridine, SitaGLIPtin-MetFORMIN HCl, glucose blood, and ibuprofen.  Meds ordered this encounter  Medications  . ibuprofen (ADVIL,MOTRIN) 800 MG tablet    Sig:   . insulin glargine (LANTUS) 100 UNIT/ML injection    Sig: Inject 0.4 mLs (40 Units total) into the skin at bedtime.    Dispense:  3 mL    Refill:  11     Follow-up: Return in about 4 months (around 03/06/2015).  Scarlette Calico, MD

## 2014-11-05 ENCOUNTER — Other Ambulatory Visit: Payer: Self-pay

## 2014-11-05 MED ORDER — INSULIN PEN NEEDLE 32G X 4 MM MISC: Status: DC

## 2014-11-17 ENCOUNTER — Encounter: Payer: Self-pay | Admitting: Endocrinology

## 2014-11-17 ENCOUNTER — Ambulatory Visit (INDEPENDENT_AMBULATORY_CARE_PROVIDER_SITE_OTHER): Payer: Medicare Other | Admitting: Endocrinology

## 2014-11-17 VITALS — BP 123/60 | HR 78 | Temp 98.1°F | Ht 71.0 in | Wt 224.0 lb

## 2014-11-17 DIAGNOSIS — E118 Type 2 diabetes mellitus with unspecified complications: Secondary | ICD-10-CM | POA: Diagnosis not present

## 2014-11-17 MED ORDER — INSULIN GLARGINE 100 UNIT/ML ~~LOC~~ SOLN: 7.0000 [IU] | Freq: Every day | SUBCUTANEOUS | Status: DC

## 2014-11-17 NOTE — Progress Notes (Signed)
Subjective:    Patient ID: Antonio Adams, male    DOB: February 07, 1946, 69 y.o.   MRN: [id number]  HPI pt states DM was dx'ed in 1997; he has moderate neuropathy of the lower extremities, but he is unaware of any associated chronic complications; he has been on insulin since early 2016, when he presented with nonketotic hyperosmolar state; pt says his diet and exercise are good; he has never had pancreatitis, severe hypoglycemia or DKA.  Pt says he takes only 7 units qam.  He says cbg's vary from 110-180.  It is in general higher as the day goes on.   Past Medical History  Diagnosis Date  . HYPERLIPIDEMIA 03/16/2007    takes Crestor daily  . CARPAL TUNNEL SYNDROME, BILATERAL 03/16/2007  . ALLERGIC RHINITIS 03/16/2007  . BACK PAIN WITH RADICULOPATHY 11/23/2008  . LEG CRAMPS 05/22/2007  . PARESTHESIA 05/21/2007  . GASTROINTESTINAL HEMORRHAGE, HX OF 03/16/2007  . Overactive bladder   . Hemorrhoids   . HYPERTENSION 03/16/2007    takes Diltiazem and Lisinopril daily   . Sleep apnea   . Chronic back pain   . Arthritis     back   . GERD 03/16/2007    takes Omeprazole daily  . Rectal bleeding     Dr.Norins has explained its from the Radiation that he has received  . History of colon polyps   . Urinary frequency     takes Toviaz daily  . Nocturia   . DIABETES MELLITUS, TYPE II 03/16/2007    takes Januvia and MEtformin daily  . Constipation     takes Senokot daily  . Glaucoma     mild  . Depression     occasionally  . Proctitis   . ADENOCARCINOMA, PROSTATE, GLEASON GRADE 6 12/21/2009    Past Surgical History  Procedure Laterality Date  . Back surgery  1983, 2008    repeat surgery; ESI '08  . Shoulder surgery  LFT  . Carpal tunnel release      bilateral  . Stress cardiolite  12/24/2003    negative for ischemia  . Wound exploration      management of a wound that he sustained in the [employer]  . Prostate surgery  march 2012    seed implant  . Cervical fusion    . Lumbar laminectomy   10/30/2011    Procedure: MICRODISCECTOMY LUMBAR LAMINECTOMY;  Surgeon: Jessy Oto, MD;  Location: [facility];  Service: Orthopedics;  Laterality: Right;  Right L4-5 and L5-S1 Microdiscectomy  . Colonosocpy    . Esophagogastroduodenoscopy      ED  . Lumbar laminectomy  06/17/2012    Procedure: MICRODISCECTOMY LUMBAR LAMINECTOMY;  Surgeon: Jessy Oto, MD;  Location: [facility];  Service: Orthopedics;  Laterality: N/A;  Right L5-S1 microdiscectomy    History   Social History  . Marital Status: Married    Spouse Name: Deneise Lever  . Number of Children: 2  . Years of Education: 12+   Occupational History  . Event organiser; Curator     retired from Event organiser   Social History Main Topics  . Smoking status: Never Smoker   . Smokeless tobacco: Never Used  . Alcohol Use: No  . Drug Use: No  . Sexual Activity: No   Other Topics Concern  . Not on file   Social History Narrative   Married-divorced '89, remarried '96. 2 sons, 1 daughter. 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 He wishes to be a full resuscitation candidate.       Daily caffeine - one or two cups daily   Right handed.    Current Outpatient Prescriptions on File Prior to Visit  Medication Sig Dispense Refill  . aspirin EC 81 MG tablet Take 81 mg by mouth daily.    Marland Kitchen atorvastatin (LIPITOR) 20 MG tablet Take 1 tablet (20 mg total) by mouth daily. 90 tablet 3  . Cholecalciferol 1000 UNITS capsule Take 1,000 Units by mouth daily.     . clonazePAM (KLONOPIN) 2 MG tablet Take 5 mg by mouth daily.    . clotrimazole-betamethasone (LOTRISONE) cream Apply 1 application topically 2 (two) times daily. 30 g 0  . diltiazem (DILACOR XR) 120 MG 24 hr capsule Take 120 mg by mouth daily.      Marland Kitchen gabapentin (NEURONTIN) 100 MG capsule Take 1 capsule (100 mg total) by mouth at bedtime. 30 capsule 11  . gemfibrozil (LOPID) 600 MG tablet Take 600 mg by mouth 2 (two) times daily.     Marland Kitchen  glucose blood (ONE TOUCH ULTRA TEST) test strip 1 each by Other route 2 (two) times daily. And lancets 2/day 250.03 100 each 12  . ibuprofen (ADVIL,MOTRIN) 800 MG tablet     . Insulin Pen Needle (NOVOFINE PLUS) 32G X 4 MM MISC Use daily with insulin 100 each 5  . lisinopril (PRINIVIL,ZESTRIL) 40 MG tablet Take 20 mg by mouth daily.     . multivitamin-iron-minerals-folic acid (CENTRUM) chewable tablet Chew 1 tablet by mouth daily.    . Needles & Syringes MISC Use syringe and needle to inject insulin into the skin as directed on insulin prescription. DX E11.09 90 each 3  . Omega-3 Fatty Acids ([facility] FISH OIL PO) Take 1 capsule by mouth daily.     Marland Kitchen omeprazole (PRILOSEC) 40 MG capsule TAKE ONE CAPSULE EVERY DAY 30 capsule 10  . phenazopyridine (PYRIDIUM) 100 MG tablet Take 100 mg by mouth 3 (three) times daily as needed for pain.    . pioglitazone (ACTOS) 15 MG tablet Take 1 tablet (15 mg total) by mouth daily. Okay for generic 30 tablet 5  . sertraline (ZOLOFT) 100 MG tablet Take 100 tablets by mouth daily.    . SitaGLIPtin-MetFORMIN HCl (JANUMET XR) 50-1000 MG TB24 Take 1 tablet by mouth daily. 30 tablet 11  . tamsulosin (FLOMAX) 0.4 MG CAPS capsule TAKE 1 CAPSULE (0.4 MG TOTAL) BY MOUTH DAILY. 30 capsule 1  . zolpidem (AMBIEN) 10 MG tablet Take 10 mg by mouth at bedtime as needed.      No current facility-administered medications on file prior to visit.    No Known Allergies  Family History  Problem Relation Age of Onset  . Cancer Sister 30    Cancer  . Hypertension Other   . Colon cancer Neg Hx   . Esophageal cancer Neg Hx   . Stomach cancer Neg Hx   . Diabetes Neg Hx     BP 123/60 mmHg  Pulse 78  Temp(Src) 98.1 F (36.7 C) (Oral)  Ht 5\' 11"  (1.803 m)  Wt 224 lb (101.606 kg)  BMI 31.26 kg/m2  SpO2 97%    Review of Systems denies blurry vision, headache, chest pain, sob, n/v, depression, cold intolerance, rhinorrhea, and easy bruising.  He has lost weight.  He has urinary  frequency, excessive diaphoresis, and leg cramps.    Objective:   Physical Exam VS: see vs page GEN: no distress HEAD:  head: no deformity eyes: no periorbital swelling, no proptosis external nose and ears are normal mouth: no lesion seen NECK: supple, thyroid is not enlarged CHEST WALL: no deformity LUNGS: clear to auscultation BREASTS:  No gynecomastia CV: reg rate and rhythm, no murmur ABD: abdomen is soft, nontender.  no hepatosplenomegaly.  not distended.  no hernia.   MUSCULOSKELETAL: muscle bulk and strength are grossly normal.  no obvious joint swelling.  gait is normal and steady.   EXTEMITIES: no deformity.  no ulcer on the feet.  feet are of normal color and temp.  no edema PULSES: dorsalis pedis intact bilat.  no carotid bruit.  NEURO:  cn 2-12 grossly intact.   readily moves all 4's.  sensation is intact to touch on the feet, .   SKIN:  Normal texture and temperature.  No rash or suspicious lesion is visible.   NODES:  None palpable at the neck.   PSYCH: alert, well-oriented.  Does not appear anxious nor depressed.   Lab Results  Component Value Date   HGBA1C 10.7* 11/04/2014   i personally reviewed electrocardiogram tracing (10/12/11): normal.  i reviewed records from Dr Ronnald Ramp: at the time of last ov, he was feeling better since he was in the hospital.    Assessment & Plan:  DM: severe exacerbation.    Patient is advised the following: Patient Instructions  good diet and exercise significantly improve the control of your diabetes.  please let me know if you wish to be referred to a dietician.  high blood sugar is very risky to your health.  you should see an eye doctor and dentist every year.  It is very important to get all recommended vaccinations.  controlling your blood pressure and cholesterol drastically reduces the damage diabetes does to your body.  Those who smoke should quit.  please discuss these with your doctor.  check your blood sugar twice a day.   vary the time of day when you check, between before the 3 meals, and at bedtime.  also check if you have symptoms of your blood sugar being too high or too low.  please keep a record of the readings and bring it to your next appointment here.  You can write it on any piece of paper.  please call us sooner if your blood sugar goes below 70, or if you have a lot of readings over 200. Please stop taking the glipizide pill.   This will probably leave you needing more insulin, so please call in a few days, to tell us how the blood sugar is doing.   Please come back for a follow-up appointment in 2 months.

## 2014-11-17 NOTE — Patient Instructions (Addendum)
good diet and exercise significantly improve the control of your diabetes.  please let me know if you wish to be referred to a dietician.  high blood sugar is very risky to your health.  you should see an eye doctor and dentist every year.  It is very important to get all recommended vaccinations.  controlling your blood pressure and cholesterol drastically reduces the damage diabetes does to your body.  Those who smoke should quit.  please discuss these with your doctor.  check your blood sugar twice a day.  vary the time of day when you check, between before the 3 meals, and at bedtime.  also check if you have symptoms of your blood sugar being too high or too low.  please keep a record of the readings and bring it to your next appointment here.  You can write it on any piece of paper.  please call us sooner if your blood sugar goes below 70, or if you have a lot of readings over 200. Please stop taking the glipizide pill.   This will probably leave you needing more insulin, so please call in a few days, to tell us how the blood sugar is doing.   Please come back for a follow-up appointment in 2 months.

## 2014-11-19 DIAGNOSIS — N3941 Urge incontinence: Secondary | ICD-10-CM | POA: Diagnosis not present

## 2014-11-19 DIAGNOSIS — R350 Frequency of micturition: Secondary | ICD-10-CM | POA: Diagnosis not present

## 2014-11-24 ENCOUNTER — Ambulatory Visit (INDEPENDENT_AMBULATORY_CARE_PROVIDER_SITE_OTHER): Payer: Medicare Other | Admitting: Internal Medicine

## 2014-11-24 VITALS — BP 136/72 | HR 94 | Temp 98.5°F | Resp 16 | Ht 71.0 in | Wt 227.0 lb

## 2014-11-24 DIAGNOSIS — S0990XA Unspecified injury of head, initial encounter: Secondary | ICD-10-CM

## 2014-11-24 NOTE — Progress Notes (Signed)
Pre visit review using our clinic review tool, if applicable. No additional management support is needed unless otherwise documented below in the visit note. 

## 2014-11-24 NOTE — Patient Instructions (Signed)

## 2014-11-25 ENCOUNTER — Encounter: Payer: Self-pay | Admitting: Internal Medicine
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
## 2014-11-27 DIAGNOSIS — M25511 Pain in right shoulder: Secondary | ICD-10-CM | POA: Diagnosis not present

## 2014-11-27 DIAGNOSIS — M25512 Pain in left shoulder: Secondary | ICD-10-CM | POA: Diagnosis not present

## 2014-12-06 ENCOUNTER — Encounter: Payer: Self-pay | Admitting: Internal Medicine

## 2014-12-06 ENCOUNTER — Ambulatory Visit
Admission: RE | Admit: 2014-12-06 | Discharge: 2014-12-06 | Disposition: A | Discharge status: Home / Self Care | Payer: Medicare Other | Source: Ambulatory Visit | Attending: Internal Medicine | Admitting: Internal Medicine

## 2014-12-06 DIAGNOSIS — S0990XA Unspecified injury of head, initial encounter: Secondary | ICD-10-CM

## 2014-12-06 DIAGNOSIS — R410 Disorientation, unspecified: Secondary | ICD-10-CM | POA: Diagnosis not present

## 2014-12-06 DIAGNOSIS — S069X9A Unspecified intracranial injury with loss of consciousness of unspecified duration, initial encounter: Secondary | ICD-10-CM | POA: Diagnosis not present

## 2014-12-06 DIAGNOSIS — R51 Headache: Secondary | ICD-10-CM | POA: Diagnosis not present

## 2014-12-10 DIAGNOSIS — R350 Frequency of micturition: Secondary | ICD-10-CM | POA: Diagnosis not present

## 2014-12-10 DIAGNOSIS — N3941 Urge incontinence: Secondary | ICD-10-CM | POA: Diagnosis not present

## 2014-12-17 DIAGNOSIS — S335XXA Sprain of ligaments of lumbar spine, initial encounter: Secondary | ICD-10-CM | POA: Diagnosis not present

## 2014-12-17 DIAGNOSIS — S139XXA Sprain of joints and ligaments of unspecified parts of neck, initial encounter: Secondary | ICD-10-CM | POA: Diagnosis not present

## 2014-12-25 ENCOUNTER — Other Ambulatory Visit: Payer: Self-pay | Admitting: Orthopedic Surgery

## 2014-12-25 DIAGNOSIS — M542 Cervicalgia: Secondary | ICD-10-CM | POA: Diagnosis not present

## 2014-12-25 DIAGNOSIS — M25512 Pain in left shoulder: Secondary | ICD-10-CM

## 2014-12-29 DIAGNOSIS — C61 Malignant neoplasm of prostate: Secondary | ICD-10-CM | POA: Diagnosis not present

## 2014-12-31 DIAGNOSIS — R350 Frequency of micturition: Secondary | ICD-10-CM | POA: Diagnosis not present

## 2014-12-31 DIAGNOSIS — N3941 Urge incontinence: Secondary | ICD-10-CM | POA: Diagnosis not present

## 2015-01-02 ENCOUNTER — Ambulatory Visit
Admission: RE | Admit: 2015-01-02 | Discharge: 2015-01-02 | Disposition: A | Discharge status: Home / Self Care | Payer: Medicare Other | Source: Ambulatory Visit | Attending: Orthopedic Surgery | Admitting: Orthopedic Surgery

## 2015-01-02 DIAGNOSIS — M542 Cervicalgia: Secondary | ICD-10-CM

## 2015-01-02 DIAGNOSIS — M25512 Pain in left shoulder: Secondary | ICD-10-CM

## 2015-01-03 ENCOUNTER — Ambulatory Visit
Admission: RE | Admit: 2015-01-03 | Discharge: 2015-01-03 | Disposition: A | Discharge status: Home / Self Care | Payer: Medicare Other | Source: Ambulatory Visit | Attending: Orthopedic Surgery | Admitting: Orthopedic Surgery

## 2015-01-03 DIAGNOSIS — M19012 Primary osteoarthritis, left shoulder: Secondary | ICD-10-CM | POA: Diagnosis not present

## 2015-01-03 DIAGNOSIS — M9971 Connective tissue and disc stenosis of intervertebral foramina of cervical region: Secondary | ICD-10-CM | POA: Diagnosis not present

## 2015-01-03 DIAGNOSIS — M47813 Spondylosis without myelopathy or radiculopathy, cervicothoracic region: Secondary | ICD-10-CM | POA: Diagnosis not present

## 2015-01-03 DIAGNOSIS — M7582 Other shoulder lesions, left shoulder: Secondary | ICD-10-CM | POA: Diagnosis not present

## 2015-01-03 DIAGNOSIS — M25512 Pain in left shoulder: Secondary | ICD-10-CM | POA: Diagnosis not present

## 2015-01-05 DIAGNOSIS — Z8546 Personal history of malignant neoplasm of prostate: Secondary | ICD-10-CM | POA: Diagnosis not present

## 2015-01-05 DIAGNOSIS — N529 Male erectile dysfunction, unspecified: Secondary | ICD-10-CM | POA: Diagnosis not present

## 2015-01-05 DIAGNOSIS — K921 Melena: Secondary | ICD-10-CM | POA: Diagnosis not present

## 2015-01-12 DIAGNOSIS — M25511 Pain in right shoulder: Secondary | ICD-10-CM | POA: Diagnosis not present

## 2015-01-12 DIAGNOSIS — M542 Cervicalgia: Secondary | ICD-10-CM | POA: Diagnosis not present

## 2015-01-19 ENCOUNTER — Ambulatory Visit (INDEPENDENT_AMBULATORY_CARE_PROVIDER_SITE_OTHER): Payer: Medicare Other | Admitting: Endocrinology

## 2015-01-19 ENCOUNTER — Encounter: Payer: Self-pay | Admitting: Endocrinology

## 2015-01-19 VITALS — BP 132/70 | HR 98 | Temp 98.3°F | Ht 71.0 in | Wt 229.0 lb

## 2015-01-19 DIAGNOSIS — E118 Type 2 diabetes mellitus with unspecified complications: Secondary | ICD-10-CM | POA: Diagnosis not present

## 2015-01-19 LAB — POCT GLYCOSYLATED HEMOGLOBIN (HGB A1C): Hemoglobin A1C: 8.1

## 2015-01-19 NOTE — Progress Notes (Signed)
Subjective:    Patient ID: Antonio Adams, male    DOB: October 20, 1945, 69 y.o.   MRN: [id number]  HPI Pt returns for f/u of diabetes mellitus: DM type: 2 Dx'ed: 0000000 Complications: polyneuropathy and renal insufficiency. Therapy: insulin since early 2016, when he presented with nonketotic hyperosmolar state. DKA: never Severe hypoglycemia: never Pancreatitis: never Other: he is on qd insulin for now.  Interval history: He takes 7 units qd.  2 mos ago, due to cbg's in the 200's, he added back janumet.  Since then, cbg's are in the low to mid-100's.  He denies hypoglycemia.  pt states he feels well in general. Past Medical History  Diagnosis Date  . HYPERLIPIDEMIA 03/16/2007    takes Crestor daily  . CARPAL TUNNEL SYNDROME, BILATERAL 03/16/2007  . ALLERGIC RHINITIS 03/16/2007  . BACK PAIN WITH RADICULOPATHY 11/23/2008  . LEG CRAMPS 05/22/2007  . PARESTHESIA 05/21/2007  . GASTROINTESTINAL HEMORRHAGE, HX OF 03/16/2007  . Overactive bladder   . Hemorrhoids   . HYPERTENSION 03/16/2007    takes Diltiazem and Lisinopril daily   . Sleep apnea   . Chronic back pain   . Arthritis     back   . GERD 03/16/2007    takes Omeprazole daily  . Rectal bleeding     Dr.Norins has explained its from the Radiation that he has received  . History of colon polyps   . Urinary frequency     takes Toviaz daily  . Nocturia   . DIABETES MELLITUS, TYPE II 03/16/2007    takes Januvia and MEtformin daily  . Constipation     takes Senokot daily  . Glaucoma     mild  . Depression     occasionally  . Proctitis   . ADENOCARCINOMA, PROSTATE, GLEASON GRADE 6 12/21/2009    Past Surgical History  Procedure Laterality Date  . Back surgery  1983, 2008    repeat surgery; ESI '08  . Shoulder surgery  LFT  . Carpal tunnel release      bilateral  . Stress cardiolite  12/24/2003    negative for ischemia  . Wound exploration      management of a wound that he sustained in the [employer]  . Prostate surgery   march 2012    seed implant  . Cervical fusion    . Lumbar laminectomy  10/30/2011    Procedure: MICRODISCECTOMY LUMBAR LAMINECTOMY;  Surgeon: Jessy Oto, MD;  Location: [facility];  Service: Orthopedics;  Laterality: Right;  Right L4-5 and L5-S1 Microdiscectomy  . Colonosocpy    . Esophagogastroduodenoscopy      ED  . Lumbar laminectomy  06/17/2012    Procedure: MICRODISCECTOMY LUMBAR LAMINECTOMY;  Surgeon: Jessy Oto, MD;  Location: [facility];  Service: Orthopedics;  Laterality: N/A;  Right L5-S1 microdiscectomy    Social History   Social History  . Marital Status: Married    Spouse Name: Deneise Lever  . Number of Children: 2  . Years of Education: 12+   Occupational History  . Event organiser; Curator     retired from Event organiser   Social History Main Topics  . Smoking status: Never Smoker   . Smokeless tobacco: Never Used  . Alcohol Use: No  . Drug Use: No  . Sexual Activity: No   Other Topics Concern  . Not on file   Social History Narrative   Married-divorced '89, remarried '96. 2 sons, 1 daughter. 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 He wishes to be a full resuscitation candidate.       Daily caffeine - one or two cups daily   Right handed.    Current Outpatient Prescriptions on File Prior to Visit  Medication Sig Dispense Refill  . aspirin EC 81 MG tablet Take 81 mg by mouth daily.    Marland Kitchen atorvastatin (LIPITOR) 20 MG tablet Take 1 tablet (20 mg total) by mouth daily. 90 tablet 3  . Cholecalciferol 1000 UNITS capsule Take 1,000 Units by mouth daily.     . clonazePAM (KLONOPIN) 2 MG tablet Take 5 mg by mouth daily.    . clotrimazole-betamethasone (LOTRISONE) cream Apply 1 application topically 2 (two) times daily. 30 g 0  . diltiazem (DILACOR XR) 120 MG 24 hr capsule Take 120 mg by mouth daily.      Marland Kitchen gabapentin (NEURONTIN) 100 MG capsule Take 1 capsule (100 mg total) by mouth at bedtime. 30 capsule 11  .  gemfibrozil (LOPID) 600 MG tablet Take 600 mg by mouth 2 (two) times daily.     Marland Kitchen glucose blood (ONE TOUCH ULTRA TEST) test strip 1 each by Other route 2 (two) times daily. And lancets 2/day 250.03 100 each 12  . ibuprofen (ADVIL,MOTRIN) 800 MG tablet     . Insulin Pen Needle (NOVOFINE PLUS) 32G X 4 MM MISC Use daily with insulin 100 each 5  . lisinopril (PRINIVIL,ZESTRIL) 40 MG tablet Take 20 mg by mouth daily.     . multivitamin-iron-minerals-folic acid (CENTRUM) chewable tablet Chew 1 tablet by mouth daily.    . Needles & Syringes MISC Use syringe and needle to inject insulin into the skin as directed on insulin prescription. DX E11.09 90 each 3  . Omega-3 Fatty Acids ([facility] FISH OIL PO) Take 1 capsule by mouth daily.     Marland Kitchen omeprazole (PRILOSEC) 40 MG capsule TAKE ONE CAPSULE EVERY DAY 30 capsule 10  . phenazopyridine (PYRIDIUM) 100 MG tablet Take 100 mg by mouth 3 (three) times daily as needed for pain.    . pioglitazone (ACTOS) 15 MG tablet Take 1 tablet (15 mg total) by mouth daily. Okay for generic 30 tablet 5  . sertraline (ZOLOFT) 100 MG tablet Take 100 tablets by mouth daily.    . SitaGLIPtin-MetFORMIN HCl (JANUMET XR) 50-1000 MG TB24 Take 1 tablet by mouth daily. 30 tablet 11  . tamsulosin (FLOMAX) 0.4 MG CAPS capsule TAKE 1 CAPSULE (0.4 MG TOTAL) BY MOUTH DAILY. 30 capsule 1  . zolpidem (AMBIEN) 10 MG tablet Take 10 mg by mouth at bedtime as needed.      No current facility-administered medications on file prior to visit.    No Known Allergies  Family History  Problem Relation Age of Onset  . Cancer Sister 30    Cancer  . Hypertension Other   . Colon cancer Neg Hx   . Esophageal cancer Neg Hx   . Stomach cancer Neg Hx   . Diabetes Neg Hx     BP 132/70 mmHg  Pulse 98  Temp(Src) 98.3 F (36.8 C) (Oral)  Ht 5\' 11"  (1.803 m)  Wt 229 lb (103.874 kg)  BMI 31.95 kg/m2  SpO2 95%  Review of Systems Denies weight change.    Objective:   Physical Exam VITAL SIGNS:  See vs  page GENERAL: no distress Pulses: dorsalis pedis intact bilat.   MSK: no deformity of the feet CV: trace bilat leg edema Skin:  no ulcer on the  feet.  normal color and temp on the feet. Neuro: sensation is intact to touch on the feet, but decreased from normal.    A1c=8.1%  Lab Results  Component Value Date   CREATININE 1.79* 11/04/2014   BUN 32* 11/04/2014   NA 133* 11/04/2014   K 4.4 11/04/2014   CL 102 11/04/2014   CO2 23 11/04/2014      Assessment & Plan:  DM: he needs increased rx Renal insufficiency: this limits oral rx options.  Patient is advised the following: Patient Instructions  check your blood sugar twice a day.  vary the time of day when you check, between before the 3 meals, and at bedtime.  also check if you have symptoms of your blood sugar being too high or too low.  please keep a record of the readings and bring it to your next appointment here.  You can write it on any piece of paper.  please call us sooner if your blood sugar goes below 70, or if you have a lot of readings over 200. Your kidneys are not working at full speed.  Because of this, your diabetes pill options are limited Please stop taking the janumet pill, and: Increase the lantus to 15 units each morning.   please call us next week, to tell us how the blood sugar is doing.   Please come back for a follow-up appointment in 2 months.

## 2015-01-19 NOTE — Patient Instructions (Addendum)
check your blood sugar twice a day.  vary the time of day when you check, between before the 3 meals, and at bedtime.  also check if you have symptoms of your blood sugar being too high or too low.  please keep a record of the readings and bring it to your next appointment here.  You can write it on any piece of paper.  please call us sooner if your blood sugar goes below 70, or if you have a lot of readings over 200. Your kidneys are not working at full speed.  Because of this, your diabetes pill options are limited Please stop taking the janumet pill, and: Increase the lantus to 15 units each morning.   please call us next week, to tell us how the blood sugar is doing.   Please come back for a follow-up appointment in 2 months.

## 2015-01-20 DIAGNOSIS — S139XXD Sprain of joints and ligaments of unspecified parts of neck, subsequent encounter: Secondary | ICD-10-CM | POA: Diagnosis not present

## 2015-01-21 DIAGNOSIS — N3941 Urge incontinence: Secondary | ICD-10-CM | POA: Diagnosis not present

## 2015-01-21 DIAGNOSIS — R350 Frequency of micturition: Secondary | ICD-10-CM | POA: Diagnosis not present

## 2015-02-06 ENCOUNTER — Encounter (HOSPITAL_BASED_OUTPATIENT_CLINIC_OR_DEPARTMENT_OTHER): Payer: Self-pay | Admitting: *Deleted

## 2015-02-06 ENCOUNTER — Emergency Department (HOSPITAL_BASED_OUTPATIENT_CLINIC_OR_DEPARTMENT_OTHER)
Admission: EM | Admit: 2015-02-06 | Discharge: 2015-02-06 | Disposition: A | Discharge status: Home / Self Care | Payer: Medicare Other | Attending: Emergency Medicine | Admitting: Emergency Medicine

## 2015-02-06 DIAGNOSIS — E1165 Type 2 diabetes mellitus with hyperglycemia: Secondary | ICD-10-CM | POA: Insufficient documentation

## 2015-02-06 DIAGNOSIS — Z79899 Other long term (current) drug therapy: Secondary | ICD-10-CM | POA: Insufficient documentation

## 2015-02-06 DIAGNOSIS — M1990 Unspecified osteoarthritis, unspecified site: Secondary | ICD-10-CM | POA: Diagnosis not present

## 2015-02-06 DIAGNOSIS — F329 Major depressive disorder, single episode, unspecified: Secondary | ICD-10-CM | POA: Diagnosis not present

## 2015-02-06 DIAGNOSIS — K5900 Constipation, unspecified: Secondary | ICD-10-CM | POA: Insufficient documentation

## 2015-02-06 DIAGNOSIS — Z7982 Long term (current) use of aspirin: Secondary | ICD-10-CM | POA: Diagnosis not present

## 2015-02-06 DIAGNOSIS — G8929 Other chronic pain: Secondary | ICD-10-CM | POA: Diagnosis not present

## 2015-02-06 DIAGNOSIS — K219 Gastro-esophageal reflux disease without esophagitis: Secondary | ICD-10-CM | POA: Diagnosis not present

## 2015-02-06 DIAGNOSIS — E785 Hyperlipidemia, unspecified: Secondary | ICD-10-CM | POA: Insufficient documentation

## 2015-02-06 DIAGNOSIS — Z86018 Personal history of other benign neoplasm: Secondary | ICD-10-CM | POA: Diagnosis not present

## 2015-02-06 DIAGNOSIS — Z86010 Personal history of colonic polyps: Secondary | ICD-10-CM | POA: Insufficient documentation

## 2015-02-06 DIAGNOSIS — Z794 Long term (current) use of insulin: Secondary | ICD-10-CM | POA: Diagnosis not present

## 2015-02-06 DIAGNOSIS — R739 Hyperglycemia, unspecified: Secondary | ICD-10-CM

## 2015-02-06 DIAGNOSIS — I10 Essential (primary) hypertension: Secondary | ICD-10-CM | POA: Diagnosis not present

## 2015-02-06 LAB — CBC WITH DIFFERENTIAL/PLATELET
BASOS PCT: 1 %
Basophils Absolute: 0 10*3/uL (ref 0.0–0.1)
Eosinophils Absolute: 0 10*3/uL (ref 0.0–0.7)
Eosinophils Relative: 1 %
HEMATOCRIT: 34.8 % — AB (ref 39.0–52.0)
Hemoglobin: 12.1 g/dL — ABNORMAL LOW (ref 13.0–17.0)
LYMPHS ABS: 1 10*3/uL (ref 0.7–4.0)
LYMPHS PCT: 29 %
MCH: 29.1 pg (ref 26.0–34.0)
MCHC: 34.8 g/dL (ref 30.0–36.0)
MCV: 83.7 fL (ref 78.0–100.0)
MONO ABS: 0.4 10*3/uL (ref 0.1–1.0)
MONOS PCT: 11 %
NEUTROS ABS: 1.9 10*3/uL (ref 1.7–7.7)
Neutrophils Relative %: 58 %
Platelets: 213 10*3/uL (ref 150–400)
RBC: 4.16 MIL/uL — ABNORMAL LOW (ref 4.22–5.81)
RDW: 13.7 % (ref 11.5–15.5)
WBC: 3.3 10*3/uL — ABNORMAL LOW (ref 4.0–10.5)

## 2015-02-06 LAB — URINE MICROSCOPIC-ADD ON

## 2015-02-06 LAB — CBG MONITORING, ED
GLUCOSE-CAPILLARY: 422 mg/dL — AB (ref 65–99)
GLUCOSE-CAPILLARY: 326 mg/dL — AB (ref 65–99)
Glucose-Capillary: 401 mg/dL — ABNORMAL HIGH (ref 65–99)

## 2015-02-06 LAB — BASIC METABOLIC PANEL
Anion gap: 8 (ref 5–15)
BUN: 23 mg/dL — AB (ref 6–20)
CALCIUM: 10.1 mg/dL (ref 8.9–10.3)
CO2: 24 mmol/L (ref 22–32)
CREATININE: 1.4 mg/dL — AB (ref 0.61–1.24)
Chloride: 102 mmol/L (ref 101–111)
GFR calc Af Amer: 58 mL/min — ABNORMAL LOW (ref >60)
GFR calc non Af Amer: 50 mL/min — ABNORMAL LOW (ref >60)
GLUCOSE: 404 mg/dL — AB (ref 65–99)
Potassium: 4.7 mmol/L (ref 3.5–5.1)
Sodium: 134 mmol/L — ABNORMAL LOW (ref 135–145)

## 2015-02-06 LAB — URINALYSIS, ROUTINE W REFLEX MICROSCOPIC
BILIRUBIN URINE: NEGATIVE
Glucose, UA: >1000 mg/dL — AB
HGB URINE DIPSTICK: NEGATIVE
Ketones, ur: NEGATIVE mg/dL
Leukocytes, UA: NEGATIVE
Nitrite: NEGATIVE
PH: 6 (ref 5.0–8.0)
Protein, ur: NEGATIVE mg/dL
SPECIFIC GRAVITY, URINE: 1.024 (ref 1.005–1.030)
Urobilinogen, UA: 0.2 mg/dL (ref 0.0–1.0)

## 2015-02-06 MED ORDER — INSULIN ASPART 100 UNIT/ML IV SOLN
10.0000 [IU] | Freq: Once | INTRAVENOUS | Status: AC
  Administered 2015-02-06: 10 [IU] via INTRAVENOUS
  Filled 2015-02-06: qty 1

## 2015-02-06 MED ORDER — SODIUM CHLORIDE 0.9 % IV BOLUS (SEPSIS)
1000.0000 mL | Freq: Once | INTRAVENOUS | Status: AC
  Administered 2015-02-06: 1000 mL via INTRAVENOUS

## 2015-02-06 NOTE — ED Provider Notes (Signed)
CSN: [id number]     Arrival date & time 02/06/15  1245 History   First MD Initiated Contact with Patient 02/06/15 1256     Chief Complaint  Patient presents with  . Hyperglycemia     (Consider location/radiation/quality/duration/timing/severity/associated sxs/prior Treatment) HPI 69 y.o. male presenting with asymptomatic hyperglycemia to 400s x 2 weeks after dc of janumet per pcp.  Pt states he is compliant with diet, denies medical complaints such as associated chest pain, dyspnea, nausea, vomiting, fever, cough.  Timing of hyperglycemia constant.  Lantus has not relieved it. Past Medical History  Diagnosis Date  . HYPERLIPIDEMIA 03/16/2007    takes Crestor daily  . CARPAL TUNNEL SYNDROME, BILATERAL 03/16/2007  . ALLERGIC RHINITIS 03/16/2007  . BACK PAIN WITH RADICULOPATHY 11/23/2008  . LEG CRAMPS 05/22/2007  . PARESTHESIA 05/21/2007  . GASTROINTESTINAL HEMORRHAGE, HX OF 03/16/2007  . Overactive bladder   . Hemorrhoids   . HYPERTENSION 03/16/2007    takes Diltiazem and Lisinopril daily   . Sleep apnea   . Chronic back pain   . Arthritis     back   . GERD 03/16/2007    takes Omeprazole daily  . Rectal bleeding     Dr.Norins has explained its from the Radiation that he has received  . History of colon polyps   . Urinary frequency     takes Toviaz daily  . Nocturia   . DIABETES MELLITUS, TYPE II 03/16/2007    takes Januvia and MEtformin daily  . Constipation     takes Senokot daily  . Glaucoma     mild  . Depression     occasionally  . Proctitis   . ADENOCARCINOMA, PROSTATE, GLEASON GRADE 6 12/21/2009   Past Surgical History  Procedure Laterality Date  . Back surgery  1983, 2008    repeat surgery; ESI '08  . Shoulder surgery  LFT  . Carpal tunnel release      bilateral  . Stress cardiolite  12/24/2003    negative for ischemia  . Wound exploration      management of a wound that he sustained in the [employer]  . Prostate surgery  march 2012    seed implant  . Cervical  fusion    . Lumbar laminectomy  10/30/2011    Procedure: MICRODISCECTOMY LUMBAR LAMINECTOMY;  Surgeon: Jessy Oto, MD;  Location: [place];  Service: Orthopedics;  Laterality: Right;  Right L4-5 and L5-S1 Microdiscectomy  . Colonosocpy    . Esophagogastroduodenoscopy      ED  . Lumbar laminectomy  06/17/2012    Procedure: MICRODISCECTOMY LUMBAR LAMINECTOMY;  Surgeon: Jessy Oto, MD;  Location: [place];  Service: Orthopedics;  Laterality: N/A;  Right L5-S1 microdiscectomy   Family History  Problem Relation Age of Onset  . Cancer Sister 30    Cancer  . Hypertension Other   . Colon cancer Neg Hx   . Esophageal cancer Neg Hx   . Stomach cancer Neg Hx   . Diabetes Neg Hx    Social History  Substance Use Topics  . Smoking status: Never Smoker   . Smokeless tobacco: Never Used  . Alcohol Use: Yes    Review of Systems  All other systems reviewed and are negative.     Allergies  Review of patient's allergies indicates no known allergies.  Home Medications   Prior to Admission medications   Medication Sig Start Date End Date Taking? Authorizing Quintavious Rinck  aspirin EC 81 MG tablet Take 81 mg by  mouth daily.   Yes Historical Donisha Hoch, MD  atorvastatin (LIPITOR) 20 MG tablet Take 1 tablet (20 mg total) by mouth daily. 12/10/13  Yes [name], MD  Cholecalciferol 1000 UNITS capsule Take 1,000 Units by mouth daily.    Yes Historical Mireya Meditz, MD  clonazePAM (KLONOPIN) 2 MG tablet Take 5 mg by mouth daily.   Yes Historical Kishan Wachsmuth, MD  diltiazem (DILACOR XR) 120 MG 24 hr capsule Take 120 mg by mouth daily.     Yes Historical Ellenor Wisniewski, MD  gabapentin (NEURONTIN) 100 MG capsule Take 1 capsule (100 mg total) by mouth at bedtime. 06/09/13  Yes Neena Rhymes, MD  gemfibrozil (LOPID) 600 MG tablet Take 600 mg by mouth 2 (two) times daily.    Yes Historical Blane Worthington, MD  glucose blood (ONE TOUCH ULTRA TEST) test strip 1 each by Other route 2 (two) times daily. And lancets 2/day 250.03  10/16/14  Yes [name], MD  Insulin Glargine (LANTUS SOLOSTAR) 100 UNIT/ML Solostar Pen Inject 15 Units into the skin every morning.   Yes Historical Darlette Dubow, MD  Insulin Pen Needle (NOVOFINE PLUS) 32G X 4 MM MISC Use daily with insulin 11/05/14  Yes [name], MD  lisinopril (PRINIVIL,ZESTRIL) 40 MG tablet Take 20 mg by mouth daily.    Yes Historical Shanicka Oldenkamp, MD  multivitamin-iron-minerals-folic acid (CENTRUM) chewable tablet Chew 1 tablet by mouth daily.   Yes Historical Kimani Bedoya, MD  Needles & Syringes MISC Use syringe and needle to inject insulin into the skin as directed on insulin prescription. DX E11.09 11/04/14  Yes [name], MD  Omega-3 Fatty Acids ([employer] FISH OIL PO) Take 1 capsule by mouth daily.    Yes Historical Clarke Amburn, MD  omeprazole (PRILOSEC) 40 MG capsule TAKE ONE CAPSULE EVERY DAY 02/24/13  Yes Neena Rhymes, MD  pioglitazone (ACTOS) 15 MG tablet Take 1 tablet (15 mg total) by mouth daily. Okay for generic 03/28/13  Yes Neena Rhymes, MD  sertraline (ZOLOFT) 100 MG tablet Take 100 tablets by mouth daily. 11/23/12  Yes Historical Tyria Springer, MD  tamsulosin (FLOMAX) 0.4 MG CAPS capsule TAKE 1 CAPSULE (0.4 MG TOTAL) BY MOUTH DAILY. 01/20/14  Yes [name], MD  zolpidem (AMBIEN) 10 MG tablet Take 10 mg by mouth at bedtime as needed.  07/07/12  Yes Historical Kayti Poss, MD  clotrimazole-betamethasone (LOTRISONE) cream Apply 1 application topically 2 (two) times daily. 09/15/14   [name], MD  ibuprofen (ADVIL,MOTRIN) 800 MG tablet  08/03/14   Historical Kassidee Narciso, MD  SitaGLIPtin-MetFORMIN HCl (JANUMET XR) 50-1000 MG TB24 Take 1 tablet by mouth daily. 10/13/14   [name], MD   BP 164/81 mmHg  Pulse 53  Temp(Src) 98.3 F (36.8 C) (Oral)  Resp 20  Ht 5\' 11"  (1.803 m)  Wt 225 lb (102.059 kg)  BMI 31.39 kg/m2  SpO2 100% Physical Exam  Constitutional: He is oriented to person, place, and time. He appears well-developed and well-nourished.  HENT:  Head:  Normocephalic and atraumatic.  Eyes: Conjunctivae and EOM are normal.  Neck: Normal range of motion. Neck supple.  Cardiovascular: Normal rate, regular rhythm and normal heart sounds.   Pulmonary/Chest: Effort normal and breath sounds normal. No respiratory distress.  Abdominal: He exhibits no distension. There is no tenderness. There is no rebound and no guarding.  Musculoskeletal: Normal range of motion.  Neurological: He is alert and oriented to person, place, and time.  Skin: Skin is warm and dry.  Vitals reviewed.  ED Course  Procedures (including critical care time) Labs Review Labs Reviewed  CBC WITH DIFFERENTIAL/PLATELET - Abnormal; Notable for the following:    WBC 3.3 (*)    RBC 4.16 (*)    Hemoglobin 12.1 (*)    HCT 34.8 (*)    All other components within normal limits  BASIC METABOLIC PANEL - Abnormal; Notable for the following:    Sodium 134 (*)    Glucose, Bld 404 (*)    BUN 23 (*)    Creatinine, Ser 1.40 (*)    GFR calc non Af Amer 50 (*)    GFR calc Af Amer 58 (*)    All other components within normal limits  URINALYSIS, ROUTINE W REFLEX MICROSCOPIC (NOT AT [facility]) - Abnormal; Notable for the following:    Glucose, UA >1000 (*)    All other components within normal limits  CBG MONITORING, ED - Abnormal; Notable for the following:    Glucose-Capillary 401 (*)    All other components within normal limits  CBG MONITORING, ED - Abnormal; Notable for the following:    Glucose-Capillary 422 (*)    All other components within normal limits  CBG MONITORING, ED - Abnormal; Notable for the following:    Glucose-Capillary 326 (*)    All other components within normal limits  URINE MICROSCOPIC-ADD ON    Imaging Review No results found. I have personally reviewed and evaluated these images and lab results as part of my medical decision-making.   EKG Interpretation None      MDM   Final diagnoses:  Hyperglycemia    69 y.o. male with pertinent PMH of DM,  HLD presents with hyperglycemia in setting of recent medication change.  No symptoms.  Exam benign.  Wu as above.  Given NS bolus x 2L and insulin and had improvement.  DC home with instruction to resume prior regimen.    I have reviewed all laboratory and imaging studies if ordered as above  1. Hyperglycemia         Debby Freiberg, MD 02/07/15 [phone]

## 2015-02-06 NOTE — Discharge Instructions (Signed)

## 2015-02-06 NOTE — ED Notes (Signed)
Per pt report was seen by MD recently about two weeks ago and cut back on insulin protocol. Pt has noticed "that sugars have been running high." Pt reports having hot flashed and lightheaded which he reports is not unusually for him.

## 2015-02-06 NOTE — ED Notes (Signed)
Patient states his glucose has been running high over the past week, high 300's today

## 2015-02-11 DIAGNOSIS — R350 Frequency of micturition: Secondary | ICD-10-CM | POA: Diagnosis not present

## 2015-02-11 DIAGNOSIS — N3941 Urge incontinence: Secondary | ICD-10-CM | POA: Diagnosis not present

## 2015-03-04 DIAGNOSIS — N3941 Urge incontinence: Secondary | ICD-10-CM | POA: Diagnosis not present

## 2015-03-12 DIAGNOSIS — M25521 Pain in right elbow: Secondary | ICD-10-CM | POA: Diagnosis not present

## 2015-03-12 DIAGNOSIS — M25511 Pain in right shoulder: Secondary | ICD-10-CM | POA: Diagnosis not present

## 2015-03-18 LAB — LIPID PANEL
CHOLESTEROL: 160 mg/dL (ref 0–200)
HDL: 55 mg/dL (ref 35–70)
LDL CALC: 93 mg/dL
Triglycerides: 61 mg/dL (ref 40–160)

## 2015-03-18 LAB — HEMOGLOBIN A1C: Hemoglobin A1C: 8

## 2015-03-18 LAB — BASIC METABOLIC PANEL
Glucose: 111 mg/dL
Potassium: 4.7 mmol/L (ref 3.4–5.3)
SODIUM: 138 mmol/L (ref 137–147)

## 2015-03-18 LAB — HEPATIC FUNCTION PANEL
ALT: 21 U/L (ref 10–40)
AST: 9 U/L — AB (ref 14–40)
BILIRUBIN, TOTAL: 0.2 mg/dL

## 2015-03-22 ENCOUNTER — Ambulatory Visit: Payer: Medicare Other | Admitting: Endocrinology

## 2015-03-25 DIAGNOSIS — N3941 Urge incontinence: Secondary | ICD-10-CM | POA: Diagnosis not present

## 2015-03-25 DIAGNOSIS — R350 Frequency of micturition: Secondary | ICD-10-CM | POA: Diagnosis not present

## 2015-04-02 ENCOUNTER — Emergency Department (HOSPITAL_BASED_OUTPATIENT_CLINIC_OR_DEPARTMENT_OTHER)
Admission: EM | Admit: 2015-04-02 | Discharge: 2015-04-02 | Disposition: A | Discharge status: Home / Self Care | Payer: Medicare Other | Attending: Emergency Medicine | Admitting: Emergency Medicine

## 2015-04-02 ENCOUNTER — Encounter (HOSPITAL_BASED_OUTPATIENT_CLINIC_OR_DEPARTMENT_OTHER): Payer: Self-pay | Admitting: *Deleted

## 2015-04-02 DIAGNOSIS — Z7982 Long term (current) use of aspirin: Secondary | ICD-10-CM | POA: Diagnosis not present

## 2015-04-02 DIAGNOSIS — R42 Dizziness and giddiness: Secondary | ICD-10-CM | POA: Insufficient documentation

## 2015-04-02 DIAGNOSIS — Z79899 Other long term (current) drug therapy: Secondary | ICD-10-CM | POA: Diagnosis not present

## 2015-04-02 DIAGNOSIS — E785 Hyperlipidemia, unspecified: Secondary | ICD-10-CM | POA: Insufficient documentation

## 2015-04-02 DIAGNOSIS — Y998 Other external cause status: Secondary | ICD-10-CM | POA: Insufficient documentation

## 2015-04-02 DIAGNOSIS — G8929 Other chronic pain: Secondary | ICD-10-CM | POA: Insufficient documentation

## 2015-04-02 DIAGNOSIS — I10 Essential (primary) hypertension: Secondary | ICD-10-CM | POA: Insufficient documentation

## 2015-04-02 DIAGNOSIS — E119 Type 2 diabetes mellitus without complications: Secondary | ICD-10-CM | POA: Insufficient documentation

## 2015-04-02 DIAGNOSIS — M545 Low back pain, unspecified: Secondary | ICD-10-CM

## 2015-04-02 DIAGNOSIS — Z86010 Personal history of colonic polyps: Secondary | ICD-10-CM | POA: Insufficient documentation

## 2015-04-02 DIAGNOSIS — Y92410 Unspecified street and highway as the place of occurrence of the external cause: Secondary | ICD-10-CM | POA: Insufficient documentation

## 2015-04-02 DIAGNOSIS — N3281 Overactive bladder: Secondary | ICD-10-CM | POA: Insufficient documentation

## 2015-04-02 DIAGNOSIS — Y9389 Activity, other specified: Secondary | ICD-10-CM | POA: Diagnosis not present

## 2015-04-02 DIAGNOSIS — K219 Gastro-esophageal reflux disease without esophagitis: Secondary | ICD-10-CM | POA: Diagnosis not present

## 2015-04-02 DIAGNOSIS — Z8669 Personal history of other diseases of the nervous system and sense organs: Secondary | ICD-10-CM | POA: Insufficient documentation

## 2015-04-02 DIAGNOSIS — M1990 Unspecified osteoarthritis, unspecified site: Secondary | ICD-10-CM | POA: Diagnosis not present

## 2015-04-02 DIAGNOSIS — S3992XA Unspecified injury of lower back, initial encounter: Secondary | ICD-10-CM | POA: Insufficient documentation

## 2015-04-02 DIAGNOSIS — S199XXA Unspecified injury of neck, initial encounter: Secondary | ICD-10-CM | POA: Diagnosis not present

## 2015-04-02 DIAGNOSIS — F329 Major depressive disorder, single episode, unspecified: Secondary | ICD-10-CM | POA: Diagnosis not present

## 2015-04-02 DIAGNOSIS — Z794 Long term (current) use of insulin: Secondary | ICD-10-CM | POA: Diagnosis not present

## 2015-04-02 MED ORDER — METHOCARBAMOL 500 MG PO TABS: 500.0000 mg | ORAL_TABLET | Freq: Four times a day (QID) | ORAL | Status: DC

## 2015-04-02 MED ORDER — MELOXICAM 7.5 MG PO TABS: 7.5000 mg | ORAL_TABLET | Freq: Every day | ORAL | Status: DC
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
## 2015-04-02 NOTE — Discharge Instructions (Signed)
Return to the ED with any concerns including weakness of legs, not able to urinate, loss of control of bowel or bladder, decreased level of alertness/lethargy, or any other alarming symptoms  You should not take meloxicam and ibuprofen together as they are similar medications
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
## 2015-04-22 DIAGNOSIS — R3915 Urgency of urination: Secondary | ICD-10-CM | POA: Diagnosis not present

## 2015-05-05 DIAGNOSIS — S139XXD Sprain of joints and ligaments of unspecified parts of neck, subsequent encounter: Secondary | ICD-10-CM | POA: Diagnosis not present

## 2015-05-05 DIAGNOSIS — M545 Low back pain: Secondary | ICD-10-CM | POA: Diagnosis not present

## 2015-05-11 ENCOUNTER — Other Ambulatory Visit: Payer: Self-pay | Admitting: Specialist

## 2015-05-11 DIAGNOSIS — R200 Anesthesia of skin: Secondary | ICD-10-CM

## 2015-05-11 DIAGNOSIS — R52 Pain, unspecified: Secondary | ICD-10-CM

## 2015-05-13 DIAGNOSIS — N3941 Urge incontinence: Secondary | ICD-10-CM | POA: Diagnosis not present

## 2015-05-13 DIAGNOSIS — R350 Frequency of micturition: Secondary | ICD-10-CM | POA: Diagnosis not present

## 2015-05-24 ENCOUNTER — Inpatient Hospital Stay: Admission: RE | Admit: 2015-05-24 | Payer: Medicare Other | Source: Ambulatory Visit

## 2015-05-31 ENCOUNTER — Ambulatory Visit
Admission: RE | Admit: 2015-05-31 | Discharge: 2015-05-31 | Disposition: A | Discharge status: Home / Self Care | Payer: Medicare Other | Source: Ambulatory Visit | Attending: Specialist | Admitting: Specialist

## 2015-05-31 DIAGNOSIS — R200 Anesthesia of skin: Secondary | ICD-10-CM

## 2015-05-31 DIAGNOSIS — R52 Pain, unspecified: Secondary | ICD-10-CM

## 2015-05-31 DIAGNOSIS — M4806 Spinal stenosis, lumbar region: Secondary | ICD-10-CM | POA: Diagnosis not present

## 2015-05-31 MED ORDER — GADOBENATE DIMEGLUMINE 529 MG/ML IV SOLN
20.0000 mL | Freq: Once | INTRAVENOUS | Status: AC | PRN
  Administered 2015-05-31: 20 mL via INTRAVENOUS

## 2015-06-03 DIAGNOSIS — R350 Frequency of micturition: Secondary | ICD-10-CM | POA: Diagnosis not present

## 2015-06-08 DIAGNOSIS — M25512 Pain in left shoulder: Secondary | ICD-10-CM | POA: Diagnosis not present

## 2015-06-14 ENCOUNTER — Other Ambulatory Visit (INDEPENDENT_AMBULATORY_CARE_PROVIDER_SITE_OTHER): Payer: Medicare Other

## 2015-06-14 ENCOUNTER — Ambulatory Visit (INDEPENDENT_AMBULATORY_CARE_PROVIDER_SITE_OTHER): Payer: Medicare Other | Admitting: Internal Medicine

## 2015-06-14 ENCOUNTER — Encounter: Payer: Self-pay | Admitting: Internal Medicine

## 2015-06-14 VITALS — BP 138/62 | HR 78 | Temp 97.8°F | Resp 16 | Ht 71.0 in | Wt 235.0 lb

## 2015-06-14 DIAGNOSIS — D510 Vitamin B12 deficiency anemia due to intrinsic factor deficiency: Secondary | ICD-10-CM | POA: Diagnosis not present

## 2015-06-14 DIAGNOSIS — E785 Hyperlipidemia, unspecified: Secondary | ICD-10-CM | POA: Diagnosis not present

## 2015-06-14 DIAGNOSIS — Z794 Long term (current) use of insulin: Secondary | ICD-10-CM

## 2015-06-14 DIAGNOSIS — E118 Type 2 diabetes mellitus with unspecified complications: Secondary | ICD-10-CM | POA: Diagnosis not present

## 2015-06-14 DIAGNOSIS — I10 Essential (primary) hypertension: Secondary | ICD-10-CM

## 2015-06-14 DIAGNOSIS — N2889 Other specified disorders of kidney and ureter: Secondary | ICD-10-CM

## 2015-06-14 DIAGNOSIS — I83029 Varicose veins of left lower extremity with ulcer of unspecified site: Secondary | ICD-10-CM

## 2015-06-14 DIAGNOSIS — L97929 Non-pressure chronic ulcer of unspecified part of left lower leg with unspecified severity: Secondary | ICD-10-CM

## 2015-06-14 LAB — CBC WITH DIFFERENTIAL/PLATELET
BASOS PCT: 0.9 % (ref 0.0–3.0)
Basophils Absolute: 0 10*3/uL (ref 0.0–0.1)
EOS PCT: 2.4 % (ref 0.0–5.0)
Eosinophils Absolute: 0.1 10*3/uL (ref 0.0–0.7)
HCT: 36 % — ABNORMAL LOW (ref 39.0–52.0)
Hemoglobin: 11.6 g/dL — ABNORMAL LOW (ref 13.0–17.0)
LYMPHS ABS: 0.8 10*3/uL (ref 0.7–4.0)
Lymphocytes Relative: 26.6 % (ref 12.0–46.0)
MCHC: 32.3 g/dL (ref 30.0–36.0)
MCV: 88.6 fl (ref 78.0–100.0)
MONO ABS: 0.4 10*3/uL (ref 0.1–1.0)
MONOS PCT: 13.2 % — AB (ref 3.0–12.0)
NEUTROS ABS: 1.8 10*3/uL (ref 1.4–7.7)
Neutrophils Relative %: 56.9 % (ref 43.0–77.0)
Platelets: 216 10*3/uL (ref 150.0–400.0)
RBC: 4.06 Mil/uL — ABNORMAL LOW (ref 4.22–5.81)
RDW: 14.9 % (ref 11.5–15.5)
WBC: 3.2 10*3/uL — ABNORMAL LOW (ref 4.0–10.5)

## 2015-06-14 LAB — MICROALBUMIN / CREATININE URINE RATIO
Creatinine,U: 64 mg/dL
Microalb Creat Ratio: 5.2 mg/g (ref 0.0–30.0)
Microalb, Ur: 3.3 mg/dL — ABNORMAL HIGH (ref 0.0–1.9)

## 2015-06-14 LAB — BASIC METABOLIC PANEL
BUN: 24 mg/dL — AB (ref 6–23)
CHLORIDE: 104 meq/L (ref 96–112)
CO2: 25 meq/L (ref 19–32)
CREATININE: 1.43 mg/dL (ref 0.40–1.50)
Calcium: 9.5 mg/dL (ref 8.4–10.5)
GFR: 63.03 mL/min (ref >60.00)
GLUCOSE: 276 mg/dL — AB (ref 70–99)
POTASSIUM: 4.4 meq/L (ref 3.5–5.1)
Sodium: 137 mEq/L (ref 135–145)

## 2015-06-14 LAB — URINALYSIS, ROUTINE W REFLEX MICROSCOPIC
BILIRUBIN URINE: NEGATIVE
HGB URINE DIPSTICK: NEGATIVE
Ketones, ur: NEGATIVE
LEUKOCYTES UA: NEGATIVE
NITRITE: NEGATIVE
RBC / HPF: NONE SEEN (ref 0–?)
Specific Gravity, Urine: 1.01 (ref 1.000–1.030)
Total Protein, Urine: NEGATIVE
Urine Glucose: >=1000 — AB
Urobilinogen, UA: 0.2 (ref 0.0–1.0)
pH: 6.5 (ref 5.0–8.0)

## 2015-06-14 LAB — LIPID PANEL
CHOL/HDL RATIO: 5
Cholesterol: 152 mg/dL (ref 0–200)
HDL: 28 mg/dL — AB (ref >39.00)
LDL Cholesterol: 101 mg/dL — ABNORMAL HIGH (ref 0–99)
NONHDL: 123.73
Triglycerides: 114 mg/dL (ref 0.0–149.0)
VLDL: 22.8 mg/dL (ref 0.0–40.0)

## 2015-06-14 LAB — IBC PANEL
IRON: 76 ug/dL (ref 42–165)
Saturation Ratios: 18.1 % — ABNORMAL LOW (ref 20.0–50.0)
TRANSFERRIN: 300 mg/dL (ref 212.0–360.0)

## 2015-06-14 LAB — VITAMIN B12: VITAMIN B 12: 1177 pg/mL — AB (ref 211–911)

## 2015-06-14 LAB — FERRITIN: Ferritin: 34 ng/mL (ref 22.0–322.0)

## 2015-06-14 LAB — HEMOGLOBIN A1C: HEMOGLOBIN A1C: 7.6 % — AB (ref 4.6–6.5)

## 2015-06-14 LAB — FOLATE

## 2015-06-14 LAB — TSH: TSH: 0.67 u[IU]/mL (ref 0.35–4.50)

## 2015-06-14 MED ORDER — INSULIN PEN NEEDLE 32G X 4 MM MISC: Status: DC

## 2015-06-14 MED ORDER — INSULIN GLARGINE 100 UNIT/ML SOLOSTAR PEN: 15.0000 [IU] | PEN_INJECTOR | SUBCUTANEOUS | Status: DC

## 2015-06-14 NOTE — Patient Instructions (Signed)
Anemia, Nonspecific Anemia is a condition in which the concentration of red blood cells or hemoglobin in the blood is below normal. Hemoglobin is a substance in red blood cells that carries oxygen to the tissues of the body. Anemia results in not enough oxygen reaching these tissues.  CAUSES  Common causes of anemia include:   Excessive bleeding. Bleeding may be internal or external. This includes excessive bleeding from periods (in women) or from the intestine.   Poor nutrition.   Chronic kidney, thyroid, and liver disease.  Bone marrow disorders that decrease red blood cell production.  Cancer and treatments for cancer.  HIV, AIDS, and their treatments.  Spleen problems that increase red blood cell destruction.  Blood disorders.  Excess destruction of red blood cells due to infection, medicines, and autoimmune disorders. SIGNS AND SYMPTOMS   Minor weakness.   Dizziness.   Headache.  Palpitations.   Shortness of breath, especially with exercise.   Paleness.  Cold sensitivity.  Indigestion.  Nausea.  Difficulty sleeping.  Difficulty concentrating. Symptoms may occur suddenly or they may develop slowly.  DIAGNOSIS  Additional blood tests are often needed. These help your health care provider determine the best treatment. Your health care provider will check your stool for blood and look for other causes of blood loss.  TREATMENT  Treatment varies depending on the cause of the anemia. Treatment can include:   Supplements of iron, vitamin B12, or folic acid.   Hormone medicines.   A blood transfusion. This may be needed if blood loss is severe.   Hospitalization. This may be needed if there is significant continual blood loss.   Dietary changes.  Spleen removal. HOME CARE INSTRUCTIONS Keep all follow-up appointments. It often takes many weeks to correct anemia, and having your health care provider check on your condition and your response to  treatment is very important. SEEK IMMEDIATE MEDICAL CARE IF:   You develop extreme weakness, shortness of breath, or chest pain.   You become dizzy or have trouble concentrating.  You develop heavy vaginal bleeding.   You develop a rash.   You have bloody or black, tarry stools.   You faint.   You vomit up blood.   You vomit repeatedly.   You have abdominal pain.  You have a fever or persistent symptoms for more than 2-3 days.   You have a fever and your symptoms suddenly get worse.   You are dehydrated.  MAKE SURE YOU:  Understand these instructions.  Will watch your condition.  Will get help right away if you are not doing well or get worse.   This information is not intended to replace advice given to you by your health care provider. Make sure you discuss any questions you have with your health care provider.   Document Released: 06/08/2004 Document Revised: 01/01/2013 Document Reviewed: 10/25/2012 Elsevier Interactive Patient Education 2016 [employer]  

## 2015-06-14 NOTE — Progress Notes (Signed)
Pre visit review using our clinic review tool, if applicable. No additional management support is needed unless otherwise documented below in the visit note. 
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
## 2015-06-15 ENCOUNTER — Encounter: Payer: Self-pay | Admitting: Internal Medicine

## 2015-06-16 DIAGNOSIS — L97909 Non-pressure chronic ulcer of unspecified part of unspecified lower leg with unspecified severity: Secondary | ICD-10-CM | POA: Insufficient documentation

## 2015-06-16 DIAGNOSIS — I83009 Varicose veins of unspecified lower extremity with ulcer of unspecified site: Secondary | ICD-10-CM | POA: Insufficient documentation

## 2015-06-17 ENCOUNTER — Telehealth: Payer: Self-pay | Admitting: Internal Medicine

## 2015-06-17 NOTE — Telephone Encounter (Signed)
It is a 24-hour insulin so it doesn't matter if he takes it in the morning or the evening.

## 2015-06-17 NOTE — Telephone Encounter (Signed)
Patient states Dr. Ronnald Ramp changed his insulin.  States his last insulin he would take at night.  When he picked up his new script it states to take in the morning.  Patient is concerned as to when he should take the insulin.  Please call back in regards.

## 2015-06-17 NOTE — Telephone Encounter (Signed)
Notified patient.

## 2015-06-22 ENCOUNTER — Telehealth: Payer: Self-pay | Admitting: Vascular Surgery

## 2015-06-22 NOTE — Telephone Encounter (Signed)
LM for pt to call [facility] Dyer regarding referral recvd from Scarlette Calico for vascular consult, dpm

## 2015-06-24 DIAGNOSIS — R350 Frequency of micturition: Secondary | ICD-10-CM | POA: Diagnosis not present

## 2015-06-28 ENCOUNTER — Other Ambulatory Visit: Payer: Self-pay | Admitting: Specialist

## 2015-06-28 DIAGNOSIS — N2889 Other specified disorders of kidney and ureter: Secondary | ICD-10-CM

## 2015-06-28 DIAGNOSIS — R109 Unspecified abdominal pain: Secondary | ICD-10-CM

## 2015-07-06 ENCOUNTER — Ambulatory Visit
Admission: RE | Admit: 2015-07-06 | Discharge: 2015-07-06 | Disposition: A | Discharge status: Home / Self Care | Payer: Medicare Other | Source: Ambulatory Visit | Attending: Specialist | Admitting: Specialist

## 2015-07-06 DIAGNOSIS — R109 Unspecified abdominal pain: Secondary | ICD-10-CM | POA: Diagnosis not present

## 2015-07-06 DIAGNOSIS — N2889 Other specified disorders of kidney and ureter: Secondary | ICD-10-CM

## 2015-07-06 MED ORDER — GADOBENATE DIMEGLUMINE 529 MG/ML IV SOLN
20.0000 mL | Freq: Once | INTRAVENOUS | Status: AC | PRN
  Administered 2015-07-06: 20 mL via INTRAVENOUS

## 2015-07-07 ENCOUNTER — Ambulatory Visit (INDEPENDENT_AMBULATORY_CARE_PROVIDER_SITE_OTHER): Payer: Medicare Other

## 2015-07-07 ENCOUNTER — Other Ambulatory Visit: Payer: Medicare Other

## 2015-07-07 VITALS — BP 130/70 | Ht 71.0 in | Wt 233.0 lb

## 2015-07-07 DIAGNOSIS — Z0000 Encounter for general adult medical examination without abnormal findings: Secondary | ICD-10-CM | POA: Diagnosis not present

## 2015-07-07 DIAGNOSIS — Z23 Encounter for immunization: Secondary | ICD-10-CM

## 2015-07-07 DIAGNOSIS — Z1159 Encounter for screening for other viral diseases: Secondary | ICD-10-CM

## 2015-07-07 LAB — HEPATITIS C ANTIBODY: HCV Ab: NEGATIVE

## 2015-07-07 NOTE — Patient Instructions (Addendum)
Mr. Rensing , Thank you for taking time to come for your Medicare Wellness Visit. I appreciate your ongoing commitment to your health goals. Please review the following plan we discussed and let me know if I can assist you in the future.   Will takes PSV 23 pnuemovax today; This is the last pneumonia vaccine.  Will try to lose some weight;   Good luck on MRI and please fup with Dr. Ronnald Ramp for continued concerns related to bowel and low hgb;  [facility]   These are the goals we discussed: Goals    . Weight < 220 lb (99.791 kg)     Will pick back up on weights when health is restored        This is a list of the screening recommended for you and due dates:  Health Maintenance  Topic Date Due  .  Hepatitis C: One time screening is recommended by Center for Disease Control  (CDC) for  adults born from [birth year] through 1965.   08/04/1968  . Eye exam for diabetics  11/10/2068  . Pneumonia vaccines (2 of 2 - PPSV23) 01/14/2069  . Hemoglobin A1C  12/11/2068  . Flu Shot  12/13/2068  . Complete foot exam   01/18/2069  . Colon Cancer Screening  10/22/2068  . Tetanus Vaccine  01/28/2069  . Shingles Vaccine  Completed     Fall Prevention in the Home  Falls can cause injuries. They can happen to people of all ages. There are many things you can do to make your home safe and to help prevent falls.  WHAT CAN I DO ON THE OUTSIDE OF MY HOME?  Regularly fix the edges of walkways and driveways and fix any cracks.  Remove anything that might make you trip as you walk through a door, such as a raised step or threshold.  Trim any bushes or trees on the path to your home.  Use bright outdoor lighting.  Clear any walking paths of anything that might make someone trip, such as rocks or tools.  Regularly check to see if handrails are loose or broken. Make sure that both sides of any steps have handrails.  Any raised decks and porches should have guardrails on the edges.  Have  any leaves, snow, or ice cleared regularly.  Use sand or salt on walking paths during winter.  Clean up any spills in your garage right away. This includes oil or grease spills. WHAT CAN I DO IN THE BATHROOM?   Use night lights.  Install grab bars by the toilet and in the tub and shower. Do not use towel bars as grab bars.  Use non-skid mats or decals in the tub or shower.  If you need to sit down in the shower, use a plastic, non-slip stool.  Keep the floor dry. Clean up any water that spills on the floor as soon as it happens.  Remove soap buildup in the tub or shower regularly.  Attach bath mats securely with double-sided non-slip rug tape.  Do not have throw rugs and other things on the floor that can make you trip. WHAT CAN I DO IN THE BEDROOM?  Use night lights.  Make sure that you have a light by your bed that is easy to reach.  Do not use any sheets or blankets that are too big for your bed. They should not hang down onto the floor.  Have a firm chair that has side arms. You can use  this for support while you get dressed.  Do not have throw rugs and other things on the floor that can make you trip. WHAT CAN I DO IN THE KITCHEN?  Clean up any spills right away.  Avoid walking on wet floors.  Keep items that you use a lot in easy-to-reach places.  If you need to reach something above you, use a strong step stool that has a grab bar.  Keep electrical cords out of the way.  Do not use floor polish or wax that makes floors slippery. If you must use wax, use non-skid floor wax.  Do not have throw rugs and other things on the floor that can make you trip. WHAT CAN I DO WITH MY STAIRS?  Do not leave any items on the stairs.  Make sure that there are handrails on both sides of the stairs and use them. Fix handrails that are broken or loose. Make sure that handrails are as long as the stairways.  Check any carpeting to make sure that it is firmly attached to the  stairs. Fix any carpet that is loose or worn.  Avoid having throw rugs at the top or bottom of the stairs. If you do have throw rugs, attach them to the floor with carpet tape.  Make sure that you have a light switch at the top of the stairs and the bottom of the stairs. If you do not have them, ask someone to add them for you. WHAT ELSE CAN I DO TO HELP PREVENT FALLS?  Wear shoes that:  Do not have high heels.  Have rubber bottoms.  Are comfortable and fit you well.  Are closed at the toe. Do not wear sandals.  If you use a stepladder:  Make sure that it is fully opened. Do not climb a closed stepladder.  Make sure that both sides of the stepladder are locked into place.  Ask someone to hold it for you, if possible.  Clearly mark and make sure that you can see:  Any grab bars or handrails.  First and last steps.  Where the edge of each step is.  Use tools that help you move around (mobility aids) if they are needed. These include:  Canes.  Walkers.  Scooters.  Crutches.  Turn on the lights when you go into a dark area. Replace any light bulbs as soon as they burn out.  Set up your furniture so you have a clear path. Avoid moving your furniture around.  If any of your floors are uneven, fix them.  If there are any pets around you, be aware of where they are.  Review your medicines with your doctor. Some medicines can make you feel dizzy. This can increase your chance of falling. Ask your doctor what other things that you can do to help prevent falls.   This information is not intended to replace advice given to you by your health care provider. Make sure you discuss any questions you have with your health care provider.   Document Released: 02/25/2069 Document Revised: 09/14/2068 Document Reviewed: 06/05/2068 Elsevier Interactive Patient Education 2016 [place] Maintenance, Male A healthy lifestyle and preventative care can promote health and  wellness.  Maintain regular health, dental, and eye exams.  Eat a healthy diet. Foods like vegetables, fruits, whole grains, low-fat dairy products, and lean protein foods contain the nutrients you need and are low in calories. Decrease your intake of foods high in solid fats, added sugars, and  salt. Get information about a proper diet from your health care provider, if necessary.  Regular physical exercise is one of the most important things you can do for your health. Most adults should get at least 150 minutes of moderate-intensity exercise (any activity that increases your heart rate and causes you to sweat) each week. In addition, most adults need muscle-strengthening exercises on 2 or more days a week.   Maintain a healthy weight. The body mass index (BMI) is a screening tool to identify possible weight problems. It provides an estimate of body fat based on height and weight. Your health care provider can find your BMI and can help you achieve or maintain a healthy weight. For males 20 years and older:  A BMI below 18.5 is considered underweight.  A BMI of 18.5 to 24.9 is normal.  A BMI of 25 to 29.9 is considered overweight.  A BMI of 30 and above is considered obese.  Maintain normal blood lipids and cholesterol by exercising and minimizing your intake of saturated fat. Eat a balanced diet with plenty of fruits and vegetables. Blood tests for lipids and cholesterol should begin at age 36 and be repeated every 5 years. If your lipid or cholesterol levels are high, you are over age 71, or you are at high risk for heart disease, you may need your cholesterol levels checked more frequently.Ongoing high lipid and cholesterol levels should be treated with medicines if diet and exercise are not working.  If you smoke, find out from your health care provider how to quit. If you do not use tobacco, do not start.  Lung cancer screening is recommended for adults aged 1-80 years who are at high  risk for developing lung cancer because of a history of smoking. A yearly low-dose CT scan of the lungs is recommended for people who have at least a 30-pack-year history of smoking and are current smokers or have quit within the past 15 years. A pack year of smoking is smoking an average of 1 pack of cigarettes a day for 1 year (for example, a 30-pack-year history of smoking could mean smoking 1 pack a day for 30 years or 2 packs a day for 15 years). Yearly screening should continue until the smoker has stopped smoking for at least 15 years. Yearly screening should be stopped for people who develop a health problem that would prevent them from having lung cancer treatment.  If you choose to drink alcohol, do not have more than 2 drinks per day. One drink is considered to be 12 oz (360 mL) of beer, 5 oz (150 mL) of wine, or 1.5 oz (45 mL) of liquor.  Avoid the use of street drugs. Do not share needles with anyone. Ask for help if you need support or instructions about stopping the use of drugs.  High blood pressure causes heart disease and increases the risk of stroke. High blood pressure is more likely to develop in:  People who have blood pressure in the end of the normal range (100-139/85-89 mm Hg).  People who are overweight or obese.  People who are African American.  If you are 34-56 years of age, have your blood pressure checked every 3-5 years. If you are 55 years of age or older, have your blood pressure checked every year. You should have your blood pressure measured twice--once when you are at a hospital or clinic, and once when you are not at a hospital or clinic. Record the average of  the two measurements. To check your blood pressure when you are not at a hospital or clinic, you can use:  An automated blood pressure machine at a pharmacy.  A home blood pressure monitor.  If you are 61-64 years old, ask your health care provider if you should take aspirin to prevent heart  disease.  Diabetes screening involves taking a blood sample to check your fasting blood sugar level. This should be done once every 3 years after age 75 if you are at a normal weight and without risk factors for diabetes. Testing should be considered at a younger age or be carried out more frequently if you are overweight and have at least 1 risk factor for diabetes.  Colorectal cancer can be detected and often prevented. Most routine colorectal cancer screening begins at the age of 61 and continues through age 41. However, your health care provider may recommend screening at an earlier age if you have risk factors for colon cancer. On a yearly basis, your health care provider may provide home test kits to check for hidden blood in the stool. A small camera at the end of a tube may be used to directly examine the colon (sigmoidoscopy or colonoscopy) to detect the earliest forms of colorectal cancer. Talk to your health care provider about this at age 69 when routine screening begins. A direct exam of the colon should be repeated every 5-10 years through age 51, unless early forms of precancerous polyps or small growths are found.  People who are at an increased risk for hepatitis B should be screened for this virus. You are considered at high risk for hepatitis B if:  You were born in a country where hepatitis B occurs often. Talk with your health care provider about which countries are considered high risk.  Your parents were born in a high-risk country and you have not received a shot to protect against hepatitis B (hepatitis B vaccine).  You have HIV or AIDS.  You use needles to inject street drugs.  You live with, or have sex with, someone who has hepatitis B.  You are a man who has sex with other men (MSM).  You get hemodialysis treatment.  You take certain medicines for conditions like cancer, organ transplantation, and autoimmune conditions.  Hepatitis C blood testing is recommended  for all people born from [birth year] through 1965 and any individual with known risk factors for hepatitis C.  Healthy men should no longer receive prostate-specific antigen (PSA) blood tests as part of routine cancer screening. Talk to your health care provider about prostate cancer screening.  Testicular cancer screening is not recommended for adolescents or adult males who have no symptoms. Screening includes self-exam, a health care provider exam, and other screening tests. Consult with your health care provider about any symptoms you have or any concerns you have about testicular cancer.  Practice safe sex. Use condoms and avoid high-risk sexual practices to reduce the spread of sexually transmitted infections (STIs).  You should be screened for STIs, including gonorrhea and chlamydia if:  You are sexually active and are younger than 24 years.  You are older than 24 years, and your health care provider tells you that you are at risk for this type of infection.  Your sexual activity has changed since you were last screened, and you are at an increased risk for chlamydia or gonorrhea. Ask your health care provider if you are at risk.  If you are at risk of being  infected with HIV, it is recommended that you take a prescription medicine daily to prevent HIV infection. This is called pre-exposure prophylaxis (PrEP). You are considered at risk if:  You are a man who has sex with other men (MSM).  You are a heterosexual man who is sexually active with multiple partners.  You take drugs by injection.  You are sexually active with a partner who has HIV.  Talk with your health care provider about whether you are at high risk of being infected with HIV. If you choose to begin PrEP, you should first be tested for HIV. You should then be tested every 3 months for as long as you are taking PrEP.  Use sunscreen. Apply sunscreen liberally and repeatedly throughout the day. You should seek shade when your  shadow is shorter than you. Protect yourself by wearing long sleeves, pants, a wide-brimmed hat, and sunglasses year round whenever you are outdoors.  Tell your health care provider of new moles or changes in moles, especially if there is a change in shape or color. Also, tell your health care provider if a mole is larger than the size of a pencil eraser.  A one-time screening for abdominal aortic aneurysm (AAA) and surgical repair of large AAAs by ultrasound is recommended for men aged 43-75 years who are current or former smokers.  Stay current with your vaccines (immunizations).   This information is not intended to replace advice given to you by your health care provider. Make sure you discuss any questions you have with your health care provider.   Document Released: 10/28/2007 Document Revised: 05/22/2014 Document Reviewed: 09/26/2010 Elsevier Interactive Patient Education [employer].
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
## 2015-07-08 ENCOUNTER — Encounter: Payer: Self-pay | Admitting: Internal Medicine

## 2015-07-12 ENCOUNTER — Other Ambulatory Visit: Payer: Self-pay | Admitting: *Deleted

## 2015-07-12 DIAGNOSIS — I83029 Varicose veins of left lower extremity with ulcer of unspecified site: Secondary | ICD-10-CM

## 2015-07-12 DIAGNOSIS — L97929 Non-pressure chronic ulcer of unspecified part of left lower leg with unspecified severity: Principal | ICD-10-CM

## 2015-07-12 DIAGNOSIS — M545 Low back pain: Secondary | ICD-10-CM | POA: Diagnosis not present

## 2015-07-13 DIAGNOSIS — D49511 Neoplasm of unspecified behavior of right kidney: Secondary | ICD-10-CM | POA: Diagnosis not present

## 2015-07-13 DIAGNOSIS — Z0000 Encounter for general adult medical examination without abnormal findings: Secondary | ICD-10-CM | POA: Diagnosis not present

## 2015-07-16 ENCOUNTER — Other Ambulatory Visit: Payer: Self-pay | Admitting: Internal Medicine

## 2015-07-16 LAB — PSA, SERUM (SERIAL MONITOR)
Prostate Specific Ag, Serum: 0.082
Prostate Specific Ag, Serum: 0.215

## 2015-08-02 ENCOUNTER — Encounter: Payer: Self-pay | Admitting: Vascular Surgery

## 2015-08-06 ENCOUNTER — Ambulatory Visit (INDEPENDENT_AMBULATORY_CARE_PROVIDER_SITE_OTHER): Payer: Medicare Other | Admitting: Vascular Surgery

## 2015-08-06 ENCOUNTER — Ambulatory Visit (HOSPITAL_COMMUNITY)
Admission: RE | Admit: 2015-08-06 | Discharge: 2015-08-06 | Disposition: A | Discharge status: Home / Self Care | Payer: Medicare Other | Source: Ambulatory Visit | Attending: Vascular Surgery | Admitting: Vascular Surgery

## 2015-08-06 ENCOUNTER — Encounter: Payer: Self-pay | Admitting: Vascular Surgery

## 2015-08-06 VITALS — BP 122/74 | HR 77 | Temp 99.5°F | Resp 18 | Ht 66.0 in | Wt 233.0 lb

## 2015-08-06 DIAGNOSIS — I10 Essential (primary) hypertension: Secondary | ICD-10-CM | POA: Insufficient documentation

## 2015-08-06 DIAGNOSIS — I8392 Asymptomatic varicose veins of left lower extremity: Secondary | ICD-10-CM | POA: Insufficient documentation

## 2015-08-06 DIAGNOSIS — G4730 Sleep apnea, unspecified: Secondary | ICD-10-CM | POA: Diagnosis not present

## 2015-08-06 DIAGNOSIS — K219 Gastro-esophageal reflux disease without esophagitis: Secondary | ICD-10-CM | POA: Insufficient documentation

## 2015-08-06 DIAGNOSIS — F329 Major depressive disorder, single episode, unspecified: Secondary | ICD-10-CM | POA: Insufficient documentation

## 2015-08-06 DIAGNOSIS — E119 Type 2 diabetes mellitus without complications: Secondary | ICD-10-CM | POA: Insufficient documentation

## 2015-08-06 DIAGNOSIS — I83029 Varicose veins of left lower extremity with ulcer of unspecified site: Secondary | ICD-10-CM | POA: Diagnosis not present

## 2015-08-06 DIAGNOSIS — E785 Hyperlipidemia, unspecified: Secondary | ICD-10-CM | POA: Diagnosis not present

## 2015-08-06 DIAGNOSIS — R609 Edema, unspecified: Secondary | ICD-10-CM | POA: Diagnosis present

## 2015-08-06 DIAGNOSIS — Z7984 Long term (current) use of oral hypoglycemic drugs: Secondary | ICD-10-CM | POA: Diagnosis not present

## 2015-08-06 DIAGNOSIS — L97929 Non-pressure chronic ulcer of unspecified part of left lower leg with unspecified severity: Secondary | ICD-10-CM

## 2015-08-06 DIAGNOSIS — I8393 Asymptomatic varicose veins of bilateral lower extremities: Secondary | ICD-10-CM | POA: Insufficient documentation

## 2015-08-06 NOTE — Progress Notes (Signed)
Referred by:  [name], MD [address] [place], [place] [address]  Reason for referral: left distal thigh burning   History of Present Illness  Antonio Adams is a 70 y.o. (1945/11/29) male who presents with chief complaint: burning in left distal thigh at varicose vein.  Patient has had intermittent mild-moderate burning pain in his distal thigh.  The patient attributes this to a varicose vein at that location.  The patient does not get swelling or bursting sensation.  He has a substantial history of back surgery.  The patient has had no history of DVT, no history of varicose vein, no history of venous stasis ulcers, no history of  Lymphedema and no history of skin changes in lower legs.  There is family history of venous disorders.  The patient has used OTC knee high compression stockings in the past.   Past Medical History  Diagnosis Date  . HYPERLIPIDEMIA 03/16/2007    takes Crestor daily  . CARPAL TUNNEL SYNDROME, BILATERAL 03/16/2007  . ALLERGIC RHINITIS 03/16/2007  . BACK PAIN WITH RADICULOPATHY 11/23/2008  . LEG CRAMPS 05/22/2007  . PARESTHESIA 05/21/2007  . GASTROINTESTINAL HEMORRHAGE, HX OF 03/16/2007  . Overactive bladder   . Hemorrhoids   . HYPERTENSION 03/16/2007    takes Diltiazem and Lisinopril daily   . Sleep apnea   . Chronic back pain   . Arthritis     back   . GERD 03/16/2007    takes Omeprazole daily  . Rectal bleeding     Dr.Norins has explained its from the Radiation that he has received  . History of colon polyps   . Urinary frequency     takes Toviaz daily  . Nocturia   . DIABETES MELLITUS, TYPE II 03/16/2007    takes Januvia and MEtformin daily  . Constipation     takes Senokot daily  . Glaucoma     mild  . Depression     occasionally  . Proctitis   . ADENOCARCINOMA, PROSTATE, GLEASON GRADE 6 12/21/2009    Past Surgical History  Procedure Laterality Date  . Back surgery  1983, 2008    repeat surgery; ESI '08  . Shoulder  surgery  LFT  . Carpal tunnel release      bilateral  . Stress cardiolite  12/24/2003    negative for ischemia  . Wound exploration      management of a wound that he sustained in the [employer]  . Prostate surgery  march 2012    seed implant  . Cervical fusion    . Lumbar laminectomy  10/30/2011    Procedure: MICRODISCECTOMY LUMBAR LAMINECTOMY;  Surgeon: Jessy Oto, MD;  Location: [facility];  Service: Orthopedics;  Laterality: Right;  Right L4-5 and L5-S1 Microdiscectomy  . Colonosocpy    . Esophagogastroduodenoscopy      ED  . Lumbar laminectomy  06/17/2012    Procedure: MICRODISCECTOMY LUMBAR LAMINECTOMY;  Surgeon: Jessy Oto, MD;  Location: [facility];  Service: Orthopedics;  Laterality: N/A;  Right L5-S1 microdiscectomy    Social History   Social History  . Marital Status: Married    Spouse Name: Deneise Lever  . Number of Children: 2  . Years of Education: 12+   Occupational History  . Event organiser; Curator     retired from Event organiser   Social History Main Topics  . Smoking status: Never Smoker   . Smokeless tobacco: Never Used  . Alcohol Use: 0.0 oz/week  0 Standard drinks or equivalent per week  . Drug Use: No  . Sexual Activity: No   Other Topics Concern  . Not on file   Social History Narrative   Married-divorced '89, remarried '96. 2 sons, 1 daughter. 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 He wishes to be a full resuscitation candidate.       Daily caffeine - one or two cups daily   Right handed.    Family History  Problem Relation Age of Onset  . Cancer Sister 30    Cancer  . Hypertension Other   . Colon cancer Neg Hx   . Esophageal cancer Neg Hx   . Stomach cancer Neg Hx   . Diabetes Neg Hx     Current Outpatient Prescriptions  Medication Sig Dispense Refill  . aspirin EC 81 MG tablet Take 81 mg by mouth daily.    Marland Kitchen atorvastatin (LIPITOR) 20 MG tablet Take 1 tablet (20 mg total) by mouth  daily. 90 tablet 3  . Cholecalciferol 1000 UNITS capsule Take 1,000 Units by mouth daily.     Marland Kitchen diltiazem (DILACOR XR) 120 MG 24 hr capsule Take 120 mg by mouth daily.      Marland Kitchen gabapentin (NEURONTIN) 100 MG capsule Take 1 capsule (100 mg total) by mouth at bedtime. 30 capsule 11  . gemfibrozil (LOPID) 600 MG tablet Take 600 mg by mouth 2 (two) times daily.     Marland Kitchen ibuprofen (ADVIL,MOTRIN) 800 MG tablet Reported on 07/07/2015    . Insulin Glargine (LANTUS SOLOSTAR) 100 UNIT/ML Solostar Pen Inject 15 Units into the skin every morning. 15 mL 3  . Insulin Pen Needle (NOVOFINE PLUS) 32G X 4 MM MISC Use daily with insulin 100 each 5  . lisinopril (PRINIVIL,ZESTRIL) 40 MG tablet Take 20 mg by mouth daily.     . meloxicam (MOBIC) 7.5 MG tablet Take 1 tablet (7.5 mg total) by mouth daily. 30 tablet 0  . multivitamin-iron-minerals-folic acid (CENTRUM) chewable tablet Chew 1 tablet by mouth daily.    . Omega-3 Fatty Acids (CVS FISH OIL PO) Take 1 capsule by mouth daily.     Marland Kitchen omeprazole (PRILOSEC) 40 MG capsule TAKE ONE CAPSULE EVERY DAY 30 capsule 10  . zolpidem (AMBIEN) 10 MG tablet Take 10 mg by mouth at bedtime as needed.     . clonazePAM (KLONOPIN) 2 MG tablet Take 5 mg by mouth daily. Reported on 08/06/2015    . clotrimazole-betamethasone (LOTRISONE) cream Apply 1 application topically 2 (two) times daily. (Patient not taking: Reported on 08/06/2015) 30 g 0  . glucose blood (ONE TOUCH ULTRA TEST) test strip 1 each by Other route 2 (two) times daily. And lancets 2/day 250.03 (Patient not taking: Reported on 08/06/2015) 100 each 12  . methocarbamol (ROBAXIN) 500 MG tablet Take 1 tablet (500 mg total) by mouth 4 (four) times daily. (Patient not taking: Reported on 08/06/2015) 30 tablet 0  . Needles & Syringes MISC Use syringe and needle to inject insulin into the skin as directed on insulin prescription. DX E11.09 90 each 3  . pioglitazone (ACTOS) 15 MG tablet Take 1 tablet (15 mg total) by mouth daily. Okay for  generic (Patient not taking: Reported on 08/06/2015) 30 tablet 5  . sertraline (ZOLOFT) 100 MG tablet Take 100 tablets by mouth daily. Reported on 08/06/2015    . SitaGLIPtin-MetFORMIN HCl (JANUMET XR) 50-1000 MG TB24 Take 1 tablet by mouth daily. (Patient not  taking: Reported on 08/06/2015) 30 tablet 11  . tamsulosin (FLOMAX) 0.4 MG CAPS capsule TAKE 1 CAPSULE (0.4 MG TOTAL) BY MOUTH DAILY. (Patient not taking: Reported on 07/07/2015) 30 capsule 1   No current facility-administered medications for this visit.    No Known Allergies   REVIEW OF SYSTEMS:  (Positives checked otherwise negative)  CARDIOVASCULAR:   [ ]  chest pain,  [ ]  chest pressure,  [ ]  palpitations,  [ ]  shortness of breath when laying flat,  [ ]  shortness of breath with exertion,   [ ]  pain in feet when walking,  [x]  pain in feet when laying flat, [ ]  history of blood clot in veins (DVT),  [ ]  history of phlebitis,  [ ]  swelling in legs,  [ ]  varicose veins  PULMONARY:   [ ]  productive cough,  [ ]  asthma,  [ ]  wheezing  NEUROLOGIC:   [ ]  weakness in arms or legs,  [x]  numbness in arms or legs,  [ ]  difficulty speaking or slurred speech,  [ ]  temporary loss of vision in one eye,  [ ]  dizziness  HEMATOLOGIC:   [ ]  bleeding problems,  [ ]  problems with blood clotting too easily  MUSCULOSKEL:   [ ]  joint pain, [ ]  joint swelling  GASTROINTEST:   [ ]  vomiting blood,  [ ]  blood in stool     GENITOURINARY:   [ ]  burning with urination,  [ ]  blood in urine  PSYCHIATRIC:   [ ]  history of major depression  INTEGUMENTARY:   [ ]  rashes,  [ ]  ulcers  CONSTITUTIONAL:   [ ]  fever,  [ ]  chills   Physical Examination  Filed Vitals:   08/06/15 1244  BP: 122/74  Pulse: 77  Temp: 99.5 F (37.5 C)  TempSrc: Oral  Resp: 18  Height: 5\' 6"  (1.676 m)  Weight: 233 lb (105.688 kg)  SpO2: 100%   Body mass index is 37.63 kg/(m^2).  General: A&O x 3, WD, WN  Head: [place]/AT  Ear/Nose/Throat: Hearing  grossly intact, nares without erythema or drainage, oropharynx without Erythema/Exudate, Mallampati score: 3  Eyes: PERRLA, EOMI  Neck: Supple, no nuchal rigidity, no palpable LAD  Pulmonary: Sym exp, good air movt, CTAB, no rales, rhonchi, & wheezing  Cardiac: RRR, Nl S1, S2, no Murmurs, rubs or gallops  Vascular: Vessel Right Left  Radial Palpable Palpable  Brachial Palpable Palpable  Carotid Palpable, without bruit Palpable, without bruit  Aorta Not palpable N/A  Femoral Palpable Palpable  Popliteal Not palpable Not palpable  PT Palpable Palpable  DP Palpable Palpable   Gastrointestinal: soft, NTND, no G/R, no HSM, no masses, no CVAT B  Musculoskeletal: M/S 5/5 throughout , Extremities without ischemic changes , no LDS, no edema, small varicosities and spider veins  Neurologic: CN 2-12 intact , Pain and light touch intact in extremities , Motor exam as listed above  Psychiatric: Judgment intact, Mood & affect appropriate for pt's clinical situation  Dermatologic: See M/S exam for extremity exam, no rashes otherwise noted  Lymph : No Cervical, Axillary, or Inguinal lymphadenopathy    Non-Invasive Vascular Imaging  LLE Venous Insufficiency Duplex (Date: 08/06/2015):   LLE:  no DVT and SVT,   noGSV reflux,   no SSV reflux,  + deep venous reflux: CFV   Outside Studies/Documentation 3 pages of outside documents were reviewed including: outpatient PCP chart.   Medical Decision Making  Antonio Adams is a 70 y.o. male who presents with: LLE chronic  venous insufficiency (C2), varicose veins without complications   I doubt this patient's sx are due to his CVI as his sx are not consistent with venous disease.  Based on the patient's history and examination, I recommend: OTC compression stockings if he develops any swelling.  Thank you for allowing [place] to participate in this patient's care.   Adele Barthel, MD [facility] Office: [phone] Pager: [phone]  08/06/2015, 1:00 PM

## 2015-08-09 DIAGNOSIS — N281 Cyst of kidney, acquired: Secondary | ICD-10-CM | POA: Diagnosis not present

## 2015-08-09 DIAGNOSIS — Z0000 Encounter for general adult medical examination without abnormal findings: Secondary | ICD-10-CM | POA: Diagnosis not present

## 2015-08-10 ENCOUNTER — Other Ambulatory Visit: Payer: Self-pay | Admitting: Urology

## 2015-08-10 ENCOUNTER — Telehealth: Payer: Self-pay | Admitting: Internal Medicine

## 2015-08-10 NOTE — Telephone Encounter (Signed)
Pam from [facility] states pt has a right renal mass and they will be doing right robotic partial nephrectomy on April 26th and Dr. Louis Meckel requesting clearance.  She will be faxing a clearance form for this.

## 2015-08-16 NOTE — Telephone Encounter (Signed)
Per pcp he needs an apt for an EKG and surgery clearance (30) apt please schedule this

## 2015-08-23 DIAGNOSIS — M545 Low back pain: Secondary | ICD-10-CM | POA: Diagnosis not present

## 2015-08-23 DIAGNOSIS — R200 Anesthesia of skin: Secondary | ICD-10-CM | POA: Diagnosis not present

## 2015-08-24 ENCOUNTER — Other Ambulatory Visit (INDEPENDENT_AMBULATORY_CARE_PROVIDER_SITE_OTHER): Payer: Medicare Other

## 2015-08-24 ENCOUNTER — Encounter: Payer: Self-pay | Admitting: Internal Medicine

## 2015-08-24 ENCOUNTER — Ambulatory Visit (INDEPENDENT_AMBULATORY_CARE_PROVIDER_SITE_OTHER): Payer: Medicare Other | Admitting: Internal Medicine

## 2015-08-24 VITALS — BP 110/70 | HR 90 | Temp 97.8°F | Resp 16 | Ht 66.0 in | Wt 231.0 lb

## 2015-08-24 DIAGNOSIS — D510 Vitamin B12 deficiency anemia due to intrinsic factor deficiency: Secondary | ICD-10-CM | POA: Diagnosis not present

## 2015-08-24 DIAGNOSIS — E118 Type 2 diabetes mellitus with unspecified complications: Secondary | ICD-10-CM

## 2015-08-24 DIAGNOSIS — I10 Essential (primary) hypertension: Secondary | ICD-10-CM | POA: Diagnosis not present

## 2015-08-24 DIAGNOSIS — Z794 Long term (current) use of insulin: Secondary | ICD-10-CM

## 2015-08-24 LAB — BASIC METABOLIC PANEL
BUN: 25 mg/dL — ABNORMAL HIGH (ref 6–23)
CHLORIDE: 104 meq/L (ref 96–112)
CO2: 25 meq/L (ref 19–32)
CREATININE: 1.66 mg/dL — AB (ref 0.40–1.50)
Calcium: 10.7 mg/dL — ABNORMAL HIGH (ref 8.4–10.5)
GFR: 53.03 mL/min — ABNORMAL LOW (ref >60.00)
Glucose, Bld: 148 mg/dL — ABNORMAL HIGH (ref 70–99)
Potassium: 4.5 mEq/L (ref 3.5–5.1)
SODIUM: 137 meq/L (ref 135–145)

## 2015-08-24 LAB — CBC WITH DIFFERENTIAL/PLATELET
BASOS ABS: 0 10*3/uL (ref 0.0–0.1)
Basophils Relative: 0.5 % (ref 0.0–3.0)
EOS ABS: 0.1 10*3/uL (ref 0.0–0.7)
Eosinophils Relative: 2.9 % (ref 0.0–5.0)
HCT: 37 % — ABNORMAL LOW (ref 39.0–52.0)
Hemoglobin: 12.4 g/dL — ABNORMAL LOW (ref 13.0–17.0)
LYMPHS ABS: 1.2 10*3/uL (ref 0.7–4.0)
Lymphocytes Relative: 27.5 % (ref 12.0–46.0)
MCHC: 33.5 g/dL (ref 30.0–36.0)
MCV: 86.3 fl (ref 78.0–100.0)
MONO ABS: 0.8 10*3/uL (ref 0.1–1.0)
Monocytes Relative: 17.9 % — ABNORMAL HIGH (ref 3.0–12.0)
NEUTROS PCT: 51.2 % (ref 43.0–77.0)
Neutro Abs: 2.2 10*3/uL (ref 1.4–7.7)
PLATELETS: 235 10*3/uL (ref 150.0–400.0)
RBC: 4.28 Mil/uL (ref 4.22–5.81)
RDW: 15 % (ref 11.5–15.5)
WBC: 4.3 10*3/uL (ref 4.0–10.5)

## 2015-08-24 LAB — HEMOGLOBIN A1C: HEMOGLOBIN A1C: 7.8 % — AB (ref 4.6–6.5)

## 2015-08-24 NOTE — Patient Instructions (Signed)

## 2015-08-24 NOTE — Progress Notes (Signed)
Subjective:  Patient ID: Antonio Adams, male    DOB: 21-Jun-1945  Age: 70 y.o. MRN: [id number]  CC: Anemia; Hypertension; and Diabetes   HPI Antonio Adams presents for surgical clearance. In a few weeks he will have robotic/urological surgery for a renal mass. He feels well today and offers no complaints. He has had no recent episodes of chest pain, shortness of breath, dyspnea on exertion, fatigue, diaphoresis, or palpitations. He tells me that his blood pressures been well controlled on the combination of diltiazem and lisinopril.  Outpatient Prescriptions Prior to Visit  Medication Sig Dispense Refill  . aspirin EC 81 MG tablet Take 81 mg by mouth daily.    Marland Kitchen atorvastatin (LIPITOR) 20 MG tablet Take 1 tablet (20 mg total) by mouth daily. 90 tablet 3  . Cholecalciferol 1000 UNITS capsule Take 1,000 Units by mouth daily.     . clonazePAM (KLONOPIN) 2 MG tablet Take 5 mg by mouth daily. Reported on 08/06/2015    . diltiazem (DILACOR XR) 120 MG 24 hr capsule Take 120 mg by mouth daily.      Marland Kitchen gabapentin (NEURONTIN) 100 MG capsule Take 1 capsule (100 mg total) by mouth at bedtime. 30 capsule 11  . gemfibrozil (LOPID) 600 MG tablet Take 600 mg by mouth 2 (two) times daily.     Marland Kitchen ibuprofen (ADVIL,MOTRIN) 800 MG tablet Reported on 07/07/2015    . Insulin Glargine (LANTUS SOLOSTAR) 100 UNIT/ML Solostar Pen Inject 15 Units into the skin every morning. 15 mL 3  . Insulin Pen Needle (NOVOFINE PLUS) 32G X 4 MM MISC Use daily with insulin 100 each 5  . lisinopril (PRINIVIL,ZESTRIL) 40 MG tablet Take 20 mg by mouth daily.     . meloxicam (MOBIC) 7.5 MG tablet Take 1 tablet (7.5 mg total) by mouth daily. 30 tablet 0  . multivitamin-iron-minerals-folic acid (CENTRUM) chewable tablet Chew 1 tablet by mouth daily.    . Needles & Syringes MISC Use syringe and needle to inject insulin into the skin as directed on insulin prescription. DX E11.09 90 each 3  . Omega-3 Fatty Acids ([facility] FISH OIL PO)  Take 1 capsule by mouth daily.     Marland Kitchen omeprazole (PRILOSEC) 40 MG capsule TAKE ONE CAPSULE EVERY DAY 30 capsule 10  . sertraline (ZOLOFT) 100 MG tablet Take 100 tablets by mouth daily. Reported on 08/06/2015    . SitaGLIPtin-MetFORMIN HCl (JANUMET XR) 50-1000 MG TB24 Take 1 tablet by mouth daily. 30 tablet 11  . tamsulosin (FLOMAX) 0.4 MG CAPS capsule TAKE 1 CAPSULE (0.4 MG TOTAL) BY MOUTH DAILY. 30 capsule 1  . zolpidem (AMBIEN) 10 MG tablet Take 10 mg by mouth at bedtime as needed.     Marland Kitchen glucose blood (ONE TOUCH ULTRA TEST) test strip 1 each by Other route 2 (two) times daily. And lancets 2/day 250.03 (Patient not taking: Reported on 08/06/2015) 100 each 12  . clotrimazole-betamethasone (LOTRISONE) cream Apply 1 application topically 2 (two) times daily. (Patient not taking: Reported on 08/06/2015) 30 g 0  . methocarbamol (ROBAXIN) 500 MG tablet Take 1 tablet (500 mg total) by mouth 4 (four) times daily. (Patient not taking: Reported on 08/06/2015) 30 tablet 0  . pioglitazone (ACTOS) 15 MG tablet Take 1 tablet (15 mg total) by mouth daily. Okay for generic (Patient not taking: Reported on 08/06/2015) 30 tablet 5   No facility-administered medications prior to visit.    ROS Review of Systems  Constitutional: Negative.   HENT: Negative.  Eyes: Negative.  Negative for visual disturbance.  Respiratory: Negative.  Negative for cough, choking, chest tightness, shortness of breath and stridor.   Cardiovascular: Negative.  Negative for chest pain, palpitations and leg swelling.  Gastrointestinal: Negative.  Negative for nausea, abdominal pain, diarrhea, constipation and blood in stool.  Endocrine: Negative.   Genitourinary: Negative.  Negative for dysuria, urgency, hematuria, flank pain, decreased urine volume and difficulty urinating.  Musculoskeletal: Negative.  Negative for myalgias, back pain, arthralgias and neck pain.  Skin: Negative.  Negative for color change and rash.  Allergic/Immunologic:  Negative.   Neurological: Negative.  Negative for dizziness, tremors, weakness, light-headedness, numbness and headaches.  Hematological: Negative.  Negative for adenopathy. Does not bruise/bleed easily.  Psychiatric/Behavioral: Negative.     Objective:  BP 110/70 mmHg  Pulse 90  Temp(Src) 97.8 F (36.6 C) (Oral)  Resp 16  Ht 5\' 6"  (1.676 m)  Wt 231 lb (104.781 kg)  BMI 37.30 kg/m2  SpO2 96%  BP Readings from Last 3 Encounters:  08/24/15 110/70  08/06/15 122/74  07/07/15 130/70    Wt Readings from Last 3 Encounters:  08/24/15 231 lb (104.781 kg)  08/06/15 233 lb (105.688 kg)  07/07/15 233 lb (105.688 kg)    Physical Exam  Constitutional: He is oriented to person, place, and time. He appears well-developed and well-nourished. No distress.  HENT:  Head: Normocephalic and atraumatic.  Mouth/Throat: Oropharynx is clear and moist. No oropharyngeal exudate.  Eyes: Conjunctivae are normal. Right eye exhibits no discharge. Left eye exhibits no discharge. No scleral icterus.  Neck: Normal range of motion. Neck supple. No JVD present. No tracheal deviation present. No thyromegaly present.  Cardiovascular: Normal rate, regular rhythm, normal heart sounds and intact distal pulses.  Exam reveals no gallop and no friction rub.   No murmur heard. EKG ----  Sinus  Rhythm  WITHIN NORMAL LIMITS  Pulmonary/Chest: Effort normal and breath sounds normal. No stridor. No respiratory distress. He has no wheezes. He has no rales. He exhibits no tenderness.  Abdominal: Soft. Bowel sounds are normal. He exhibits no distension and no mass. There is no tenderness. There is no rebound and no guarding.  Musculoskeletal: Normal range of motion. He exhibits no edema or tenderness.  Lymphadenopathy:    He has no cervical adenopathy.  Neurological: He is oriented to person, place, and time.  Skin: Skin is warm and dry. No rash noted. He is not diaphoretic. No erythema. No pallor.  Vitals  reviewed.   Lab Results  Component Value Date   WBC 4.3 08/24/2015   HGB 12.4* 08/24/2015   HCT 37.0* 08/24/2015   PLT 235.0 08/24/2015   GLUCOSE 148* 08/24/2015   CHOL 152 06/14/2015   TRIG 114.0 06/14/2015   HDL 28.00* 06/14/2015   LDLCALC 101* 06/14/2015   ALT 21 03/18/2015   AST 9* 03/18/2015   NA 137 08/24/2015   K 4.5 08/24/2015   CL 104 08/24/2015   CREATININE 1.66* 08/24/2015   BUN 25* 08/24/2015   CO2 25 08/24/2015   TSH 0.67 06/14/2015   PSA 0.09* 12/10/2013   INR 1.02 07/14/2010   HGBA1C 7.8* 08/24/2015   MICROALBUR 3.3* 06/14/2015    No results found.  Assessment & Plan:   Wellman was seen today for anemia, hypertension and diabetes.  Diagnoses and all orders for this visit:  Essential hypertension- his EKG is normal and he has no concerning symptoms, I think he is low risk for surgery and have written a clearance letter  for him. -     Basic metabolic panel; Future -     EKG 12-Lead  Type 2 diabetes mellitus with complication, with long-term current use of insulin ([place])- his blood sugars have increased some but I do not think this will affect his upcoming surgery, for now he will continue the current regimen for blood sugar control. -     Basic metabolic panel; Future -     Hemoglobin A1c; Future  Pernicious anemia- improvement noted, this is most likely the anemia of chronic disease. -     CBC with Differential/Platelet; Future   I have discontinued Mr. Deignan pioglitazone, clotrimazole-betamethasone, and methocarbamol. I am also having him maintain his diltiazem, gemfibrozil, lisinopril, Cholecalciferol, zolpidem, sertraline, omeprazole, gabapentin, Omega-3 Fatty Acids ([facility]S FISH OIL PO), aspirin EC, atorvastatin, multivitamin-iron-minerals-folic acid, tamsulosin, clonazePAM, SitaGLIPtin-MetFORMIN HCl, glucose blood, ibuprofen, Needles & Syringes, meloxicam, Insulin Glargine, and Insulin Pen Needle.  No orders of the defined types were placed in  this encounter.     Follow-up: Return in about 4 months (around 12/24/2015).  Scarlette Calico, MD

## 2015-08-24 NOTE — Progress Notes (Signed)
Pre visit review using our clinic review tool, if applicable. No additional management support is needed unless otherwise documented below in the visit note. 

## 2015-08-25 ENCOUNTER — Encounter: Payer: Self-pay | Admitting: Internal Medicine

## 2015-09-02 ENCOUNTER — Encounter (HOSPITAL_COMMUNITY)
Admission: RE | Admit: 2015-09-02 | Discharge: 2015-09-02 | Disposition: A | Discharge status: Home / Self Care | Payer: Medicare Other | Source: Ambulatory Visit | Attending: Urology | Admitting: Urology

## 2015-09-02 ENCOUNTER — Encounter (HOSPITAL_COMMUNITY): Payer: Self-pay

## 2015-09-02 DIAGNOSIS — Z01812 Encounter for preprocedural laboratory examination: Secondary | ICD-10-CM | POA: Insufficient documentation

## 2015-09-02 LAB — ABO/RH: ABO/RH(D): O POS

## 2015-09-02 NOTE — Patient Instructions (Addendum)
YOUR PROCEDURE IS SCHEDULED ON :  09/08/15  REPORT TO [facility] MAIN ENTRANCE FOLLOW SIGNS TO EAST ELEVATOR - GO TO 3rd FLOOR CHECK IN AT 3 EAST NURSES STATION (SHORT STAY) AT:  5:30 AM  CALL THIS NUMBER IF YOU HAVE PROBLEMS THE MORNING OF SURGERY [phone]  REMEMBER:ONLY 1 PER PERSON MAY GO TO SHORT STAY WITH YOU TO GET READY THE MORNING OF YOUR SURGERY  DO NOT EAT FOOD OR DRINK LIQUIDS AFTER MIDNIGHT  TAKE THESE MEDICINES THE MORNING OF SURGERY: DILTIAZEM / OMEPRAZOLE TAKE 1/2 DOSE OF INSULIN THE NIGHT BEFORE SURGERY  BRING C-PAP TUBING AND MASK TO HOSPITAL  YOU MAY NOT HAVE ANY METAL ON YOUR BODY INCLUDING HAIR PINS AND PIERCING'S. DO NOT WEAR JEWELRY, MAKEUP, LOTIONS, POWDERS OR PERFUMES. DO NOT WEAR NAIL POLISH. DO NOT SHAVE 48 HRS PRIOR TO SURGERY. MEN MAY SHAVE FACE AND NECK.  DO NOT [place]. [facility] IS NOT RESPONSIBLE FOR VALUABLES.  CONTACTS, DENTURES OR PARTIALS MAY NOT BE WORN TO SURGERY. LEAVE SUITCASE IN CAR. CAN BE BROUGHT TO ROOM AFTER SURGERY.  PATIENTS DISCHARGED THE DAY OF SURGERY WILL NOT BE ALLOWED TO DRIVE HOME.  PLEASE READ OVER THE FOLLOWING INSTRUCTION SHEETS _________________________________________________________________________________                                          [facility] - PREPARING FOR SURGERY  Before surgery, you can play an important role.  Because skin is not sterile, your skin needs to be as free of germs as possible.  You can reduce the number of germs on your skin by washing with CHG (chlorahexidine gluconate) soap before surgery.  CHG is an antiseptic cleaner which kills germs and bonds with the skin to continue killing germs even after washing. Please DO NOT use if you have an allergy to CHG or antibacterial soaps.  If your skin becomes reddened/irritated stop using the CHG and inform your nurse when you arrive at Short Stay. Do not shave (including legs and underarms) for at least  48 hours prior to the first CHG shower.  You may shave your face. Please follow these instructions carefully:   1.  Shower with CHG Soap the night before surgery and the  morning of Surgery.   2.  If you choose to wash your hair, wash your hair first as usual with your  normal  Shampoo.   3.  After you shampoo, rinse your hair and body thoroughly to remove the  shampoo.                                         4.  Use CHG as you would any other liquid soap.  You can apply chg directly  to the skin and wash . Gently wash with scrungie or clean wascloth    5.  Apply the CHG Soap to your body ONLY FROM THE NECK DOWN.   Do not use on open                           Wound or open sores. Avoid contact with eyes, ears mouth and genitals (private parts).  Genitals (private parts) with your normal soap.              6.  Wash thoroughly, paying special attention to the area where your surgery  will be performed.   7.  Thoroughly rinse your body with warm water from the neck down.   8.  DO NOT shower/wash with your normal soap after using and rinsing off  the CHG Soap .                9.  Pat yourself dry with a clean towel.             10.  Wear clean night clothes to bed after shower             11.  Place clean sheets on your bed the night of your first shower and do not  sleep with pets.  Day of Surgery : Do not apply any lotions/deodorants the morning of surgery.  Please wear clean clothes to the hospital/surgery center.  FAILURE TO FOLLOW THESE INSTRUCTIONS MAY RESULT IN THE CANCELLATION OF YOUR SURGERY    PATIENT SIGNATURE_________________________________  ______________________________________________________________________  WHAT IS A BLOOD TRANSFUSION? Blood Transfusion Information  A transfusion is the replacement of blood or some of its parts. Blood is made up of multiple cells which provide different functions.  Red blood cells carry oxygen and are  used for blood loss replacement.  White blood cells fight against infection.  Platelets control bleeding.  Plasma helps clot blood.  Other blood products are available for specialized needs, such as hemophilia or other clotting disorders. BEFORE THE TRANSFUSION  Who gives blood for transfusions?   Healthy volunteers who are fully evaluated to make sure their blood is safe. This is blood bank blood. Transfusion therapy is the safest it has ever been in the practice of medicine. Before blood is taken from a donor, a complete history is taken to make sure that person has no history of diseases nor engages in risky social behavior (examples are intravenous drug use or sexual activity with multiple partners). The donor's travel history is screened to minimize risk of transmitting infections, such as malaria. The donated blood is tested for signs of infectious diseases, such as HIV and hepatitis. The blood is then tested to be sure it is compatible with you in order to minimize the chance of a transfusion reaction. If you or a relative donates blood, this is often done in anticipation of surgery and is not appropriate for emergency situations. It takes many days to process the donated blood. RISKS AND COMPLICATIONS Although transfusion therapy is very safe and saves many lives, the main dangers of transfusion include:   Getting an infectious disease.  Developing a transfusion reaction. This is an allergic reaction to something in the blood you were given. Every precaution is taken to prevent this. The decision to have a blood transfusion has been considered carefully by your caregiver before blood is given. Blood is not given unless the benefits outweigh the risks. AFTER THE TRANSFUSION  Right after receiving a blood transfusion, you will usually feel much better and more energetic. This is especially true if your red blood cells have gotten low (anemic). The transfusion raises the level of the red  blood cells which carry oxygen, and this usually causes an energy increase.  The nurse administering the transfusion will monitor you carefully for complications. HOME CARE INSTRUCTIONS  No special instructions are needed after a transfusion. You may find your  energy is better. Speak with your caregiver about any limitations on activity for underlying diseases you may have. SEEK MEDICAL CARE IF:   Your condition is not improving after your transfusion.  You develop redness or irritation at the intravenous (IV) site. SEEK IMMEDIATE MEDICAL CARE IF:  Any of the following symptoms occur over the next 12 hours:  Shaking chills.  You have a temperature by mouth above 102 F (38.9 C), not controlled by medicine.  Chest, back, or muscle pain.  People around you feel you are not acting correctly or are confused.  Shortness of breath or difficulty breathing.  Dizziness and fainting.  You get a rash or develop hives.  You have a decrease in urine output.  Your urine turns a dark color or changes to pink, red, or brown. Any of the following symptoms occur over the next 10 days:  You have a temperature by mouth above 102 F (38.9 C), not controlled by medicine.  Shortness of breath.  Weakness after normal activity.  The white part of the eye turns yellow (jaundice).  You have a decrease in the amount of urine or are urinating less often.  Your urine turns a dark color or changes to pink, red, or brown. Document Released: 04/28/2000 Document Revised: 07/24/2011 Document Reviewed: 12/16/2007 [facility] Patient Information 2014 [place], [place].  _______________________________________________________________________

## 2015-09-03 LAB — URINE CULTURE: CULTURE: NO GROWTH

## 2015-09-07 ENCOUNTER — Encounter (HOSPITAL_COMMUNITY): Payer: Self-pay | Admitting: Anesthesiology
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
## 2015-09-07 NOTE — Anesthesia Preprocedure Evaluation (Addendum)
Anesthesia Evaluation  Patient identified by MRN, date of birth, ID band Patient awake    Reviewed: Allergy & Precautions, NPO status , Patient's Chart, lab work & pertinent test results  Airway Mallampati: II       Dental no notable dental hx. (+) Teeth Intact   Pulmonary sleep apnea and Continuous Positive Airway Pressure Ventilation ,    Pulmonary exam normal breath sounds clear to auscultation       Cardiovascular hypertension, Pt. on medications + Peripheral Vascular Disease  Normal cardiovascular exam Rhythm:Regular Rate:Normal     Neuro/Psych PSYCHIATRIC DISORDERS Depression  Neuromuscular disease    GI/Hepatic Neg liver ROS, GERD  Medicated and Controlled,Hx/o radiation proctitis   Endo/Other  diabetes, Type 2, Insulin Dependent, Oral Hypoglycemic AgentsObesity Hyperlipidemia  Renal/GU Renal InsufficiencyRenal diseaseRight renal mass Bladder dysfunction  Hx/o Prostate Ca Urinary incontinence    Musculoskeletal  (+) Arthritis , Spinal stenosis S/P Back surgery x3   Abdominal (+) + obese,   Peds  Hematology  (+) anemia ,   Anesthesia Other Findings   Reproductive/Obstetrics                           Anesthesia Physical Anesthesia Plan  ASA: III  Anesthesia Plan: General   Post-op Pain Management:    Induction: Intravenous and Cricoid pressure planned  Airway Management Planned: Oral ETT  Additional Equipment:   Intra-op Plan:   Post-operative Plan: Extubation in OR  Informed Consent: I have reviewed the patients History and Physical, chart, labs and discussed the procedure including the risks, benefits and alternatives for the proposed anesthesia with the patient or authorized representative who has indicated his/her understanding and acceptance.   Dental advisory given  Plan Discussed with: CRNA, Anesthesiologist and Surgeon  Anesthesia Plan Comments:          Anesthesia Quick Evaluation

## 2015-09-08 ENCOUNTER — Encounter (HOSPITAL_COMMUNITY): Payer: Self-pay | Admitting: *Deleted

## 2015-09-08 ENCOUNTER — Inpatient Hospital Stay (HOSPITAL_COMMUNITY): Payer: Medicare Other | Admitting: Anesthesiology

## 2015-09-08 ENCOUNTER — Inpatient Hospital Stay (HOSPITAL_COMMUNITY)
Admission: RE | Admit: 2015-09-08 | Discharge: 2015-09-10 | DRG: 657 | Disposition: A | Discharge status: Home / Self Care | Payer: Medicare Other | Source: Ambulatory Visit | Attending: Urology | Admitting: Urology

## 2015-09-08 ENCOUNTER — Encounter (HOSPITAL_COMMUNITY)
Admission: RE | Disposition: A | Discharge status: Home / Self Care | Payer: Self-pay | Source: Ambulatory Visit | Attending: Urology

## 2015-09-08 DIAGNOSIS — E119 Type 2 diabetes mellitus without complications: Secondary | ICD-10-CM | POA: Diagnosis not present

## 2015-09-08 DIAGNOSIS — D62 Acute posthemorrhagic anemia: Secondary | ICD-10-CM | POA: Diagnosis not present

## 2015-09-08 DIAGNOSIS — Z7982 Long term (current) use of aspirin: Secondary | ICD-10-CM | POA: Diagnosis not present

## 2015-09-08 DIAGNOSIS — N529 Male erectile dysfunction, unspecified: Secondary | ICD-10-CM | POA: Diagnosis present

## 2015-09-08 DIAGNOSIS — D49511 Neoplasm of unspecified behavior of right kidney: Secondary | ICD-10-CM | POA: Diagnosis not present

## 2015-09-08 DIAGNOSIS — F329 Major depressive disorder, single episode, unspecified: Secondary | ICD-10-CM | POA: Diagnosis present

## 2015-09-08 DIAGNOSIS — E785 Hyperlipidemia, unspecified: Secondary | ICD-10-CM | POA: Diagnosis present

## 2015-09-08 DIAGNOSIS — Z7984 Long term (current) use of oral hypoglycemic drugs: Secondary | ICD-10-CM

## 2015-09-08 DIAGNOSIS — C641 Malignant neoplasm of right kidney, except renal pelvis: Secondary | ICD-10-CM | POA: Diagnosis not present

## 2015-09-08 DIAGNOSIS — N2889 Other specified disorders of kidney and ureter: Secondary | ICD-10-CM | POA: Diagnosis present

## 2015-09-08 DIAGNOSIS — E669 Obesity, unspecified: Secondary | ICD-10-CM | POA: Diagnosis present

## 2015-09-08 DIAGNOSIS — G4730 Sleep apnea, unspecified: Secondary | ICD-10-CM | POA: Diagnosis present

## 2015-09-08 DIAGNOSIS — K668 Other specified disorders of peritoneum: Secondary | ICD-10-CM | POA: Diagnosis present

## 2015-09-08 DIAGNOSIS — R351 Nocturia: Secondary | ICD-10-CM | POA: Diagnosis present

## 2015-09-08 DIAGNOSIS — N281 Cyst of kidney, acquired: Secondary | ICD-10-CM | POA: Diagnosis not present

## 2015-09-08 DIAGNOSIS — Z8546 Personal history of malignant neoplasm of prostate: Secondary | ICD-10-CM | POA: Diagnosis not present

## 2015-09-08 DIAGNOSIS — Z79899 Other long term (current) drug therapy: Secondary | ICD-10-CM | POA: Diagnosis not present

## 2015-09-08 DIAGNOSIS — N189 Chronic kidney disease, unspecified: Secondary | ICD-10-CM | POA: Diagnosis not present

## 2015-09-08 DIAGNOSIS — I129 Hypertensive chronic kidney disease with stage 1 through stage 4 chronic kidney disease, or unspecified chronic kidney disease: Secondary | ICD-10-CM | POA: Diagnosis not present

## 2015-09-08 HISTORY — PX: ROBOTIC ASSITED PARTIAL NEPHRECTOMY: SHX6087

## 2015-09-08 LAB — TYPE AND SCREEN
ABO/RH(D): O POS
Antibody Screen: NEGATIVE

## 2015-09-08 LAB — GLUCOSE, CAPILLARY
GLUCOSE-CAPILLARY: 225 mg/dL — AB (ref 65–99)
Glucose-Capillary: 189 mg/dL — ABNORMAL HIGH (ref 65–99)
Glucose-Capillary: 222 mg/dL — ABNORMAL HIGH (ref 65–99)

## 2015-09-08 LAB — HEMOGLOBIN AND HEMATOCRIT, BLOOD
HCT: 32 % — ABNORMAL LOW (ref 39.0–52.0)
HEMOGLOBIN: 11 g/dL — AB (ref 13.0–17.0)

## 2015-09-08 SURGERY — NEPHRECTOMY, PARTIAL, ROBOT-ASSISTED
Anesthesia: General | Laterality: Right

## 2015-09-08 MED ORDER — GABAPENTIN 100 MG PO CAPS
100.0000 mg | ORAL_CAPSULE | Freq: Every day | ORAL | Status: DC
  Filled 2015-09-08 (×2): qty 1

## 2015-09-08 MED ORDER — HYDROCODONE-ACETAMINOPHEN 5-325 MG PO TABS: 1.0000 | ORAL_TABLET | Freq: Four times a day (QID) | ORAL | Status: DC | PRN

## 2015-09-08 MED ORDER — LIDOCAINE HCL (CARDIAC) 20 MG/ML IV SOLN
INTRAVENOUS | Status: AC
  Filled 2015-09-08: qty 5

## 2015-09-08 MED ORDER — ONDANSETRON HCL 4 MG/2ML IJ SOLN: 4.0000 mg | INTRAMUSCULAR | Status: DC | PRN

## 2015-09-08 MED ORDER — BUPIVACAINE-EPINEPHRINE 0.25% -1:200000 IJ SOLN
INTRAMUSCULAR | Status: DC | PRN
  Administered 2015-09-08: 11 mL

## 2015-09-08 MED ORDER — CETYLPYRIDINIUM CHLORIDE 0.05 % MT LIQD
7.0000 mL | Freq: Two times a day (BID) | OROMUCOSAL | Status: DC
  Administered 2015-09-09 – 2015-09-10 (×2): 7 mL via OROMUCOSAL

## 2015-09-08 MED ORDER — DILTIAZEM HCL ER 120 MG PO CP24
120.0000 mg | ORAL_CAPSULE | Freq: Every day | ORAL | Status: DC
Start: 2015-09-08 — End: 2015-09-10
  Administered 2015-09-08 – 2015-09-10 (×3): 120 mg via ORAL
  Filled 2015-09-08 (×5): qty 1

## 2015-09-08 MED ORDER — ACETAMINOPHEN 10 MG/ML IV SOLN
1000.0000 mg | Freq: Four times a day (QID) | INTRAVENOUS | Status: AC
  Administered 2015-09-08 – 2015-09-09 (×4): 1000 mg via INTRAVENOUS
  Filled 2015-09-08 (×4): qty 100

## 2015-09-08 MED ORDER — ROCURONIUM BROMIDE 100 MG/10ML IV SOLN
INTRAVENOUS | Status: AC
  Filled 2015-09-08: qty 1

## 2015-09-08 MED ORDER — LACTATED RINGERS IV SOLN
INTRAVENOUS | Status: DC | PRN
  Administered 2015-09-08 (×4): via INTRAVENOUS

## 2015-09-08 MED ORDER — TAMSULOSIN HCL 0.4 MG PO CAPS
0.4000 mg | ORAL_CAPSULE | Freq: Every day | ORAL | Status: DC
  Administered 2015-09-08 – 2015-09-10 (×3): 0.4 mg via ORAL
  Filled 2015-09-08 (×3): qty 1

## 2015-09-08 MED ORDER — MANNITOL 25 % IV SOLN
INTRAVENOUS | Status: AC
  Filled 2015-09-08: qty 100

## 2015-09-08 MED ORDER — DIPHENHYDRAMINE HCL 50 MG/ML IJ SOLN: 12.5000 mg | Freq: Four times a day (QID) | INTRAMUSCULAR | Status: DC | PRN

## 2015-09-08 MED ORDER — CEFAZOLIN SODIUM-DEXTROSE 2-4 GM/100ML-% IV SOLN
2.0000 g | INTRAVENOUS | Status: AC
  Administered 2015-09-08: 2 g via INTRAVENOUS

## 2015-09-08 MED ORDER — ONDANSETRON HCL 4 MG/2ML IJ SOLN
INTRAMUSCULAR | Status: DC | PRN
  Administered 2015-09-08 (×4): 2 mg via INTRAVENOUS

## 2015-09-08 MED ORDER — ROCURONIUM BROMIDE 100 MG/10ML IV SOLN
INTRAVENOUS | Status: DC | PRN
  Administered 2015-09-08: 50 mg via INTRAVENOUS
  Administered 2015-09-08: 30 mg via INTRAVENOUS
  Administered 2015-09-08 (×2): 20 mg via INTRAVENOUS

## 2015-09-08 MED ORDER — MEPERIDINE HCL 50 MG/ML IJ SOLN: 6.2500 mg | INTRAMUSCULAR | Status: DC | PRN

## 2015-09-08 MED ORDER — OXYCODONE HCL 5 MG PO TABS
5.0000 mg | ORAL_TABLET | ORAL | Status: DC | PRN
  Administered 2015-09-08: 5 mg via ORAL
  Filled 2015-09-08: qty 1

## 2015-09-08 MED ORDER — SODIUM CHLORIDE 0.45 % IV SOLN
INTRAVENOUS | Status: DC
  Administered 2015-09-09 – 2015-09-10 (×3): via INTRAVENOUS

## 2015-09-08 MED ORDER — SODIUM CHLORIDE 0.9 % IJ SOLN
INTRAMUSCULAR | Status: AC
  Filled 2015-09-08: qty 50

## 2015-09-08 MED ORDER — CEFAZOLIN SODIUM-DEXTROSE 2-4 GM/100ML-% IV SOLN
INTRAVENOUS | Status: AC
  Filled 2015-09-08: qty 100

## 2015-09-08 MED ORDER — MANNITOL 25 % IV SOLN
25.0000 g | Freq: Once | INTRAVENOUS | Status: AC
  Administered 2015-09-08 (×2): 50 mL via INTRAVENOUS
  Filled 2015-09-08: qty 100

## 2015-09-08 MED ORDER — FENTANYL CITRATE (PF) 100 MCG/2ML IJ SOLN
INTRAMUSCULAR | Status: DC | PRN
  Administered 2015-09-08 (×3): 50 ug via INTRAVENOUS
  Administered 2015-09-08: 100 ug via INTRAVENOUS

## 2015-09-08 MED ORDER — DEXAMETHASONE SODIUM PHOSPHATE 10 MG/ML IJ SOLN
INTRAMUSCULAR | Status: DC | PRN
  Administered 2015-09-08: 5 mg via INTRAVENOUS

## 2015-09-08 MED ORDER — ALBUMIN HUMAN 5 % IV SOLN
INTRAVENOUS | Status: AC
  Filled 2015-09-08: qty 250

## 2015-09-08 MED ORDER — MIDAZOLAM HCL 2 MG/2ML IJ SOLN
INTRAMUSCULAR | Status: AC
  Filled 2015-09-08: qty 2

## 2015-09-08 MED ORDER — PHENYLEPHRINE HCL 10 MG/ML IJ SOLN
INTRAMUSCULAR | Status: DC | PRN
  Administered 2015-09-08 (×3): 80 ug via INTRAVENOUS
  Administered 2015-09-08: 160 ug via INTRAVENOUS
  Administered 2015-09-08 (×2): 40 ug via INTRAVENOUS
  Administered 2015-09-08 (×2): 80 ug via INTRAVENOUS
  Administered 2015-09-08: 120 ug via INTRAVENOUS
  Administered 2015-09-08 (×5): 80 ug via INTRAVENOUS
  Administered 2015-09-08: 40 ug via INTRAVENOUS
  Administered 2015-09-08: 80 ug via INTRAVENOUS
  Administered 2015-09-08 (×2): 120 ug via INTRAVENOUS
  Administered 2015-09-08: 80 ug via INTRAVENOUS

## 2015-09-08 MED ORDER — SODIUM CHLORIDE 0.9 % IJ SOLN
INTRAMUSCULAR | Status: DC | PRN
  Administered 2015-09-08: 20 mL

## 2015-09-08 MED ORDER — PROPOFOL 10 MG/ML IV BOLUS
INTRAVENOUS | Status: AC
  Filled 2015-09-08: qty 20

## 2015-09-08 MED ORDER — PANTOPRAZOLE SODIUM 40 MG PO TBEC
80.0000 mg | DELAYED_RELEASE_TABLET | Freq: Every day | ORAL | Status: DC
  Administered 2015-09-08 – 2015-09-10 (×3): 80 mg via ORAL
  Filled 2015-09-08 (×3): qty 2

## 2015-09-08 MED ORDER — PHENYLEPHRINE 40 MCG/ML (10ML) SYRINGE FOR IV PUSH (FOR BLOOD PRESSURE SUPPORT)
PREFILLED_SYRINGE | INTRAVENOUS | Status: AC
  Filled 2015-09-08: qty 10

## 2015-09-08 MED ORDER — SERTRALINE HCL 100 MG PO TABS
100.0000 mg | ORAL_TABLET | Freq: Every day | ORAL | Status: DC
  Administered 2015-09-08 – 2015-09-10 (×3): 100 mg via ORAL
  Filled 2015-09-08 (×3): qty 1

## 2015-09-08 MED ORDER — SITAGLIP PHOS-METFORMIN HCL ER 50-1000 MG PO TB24: 1.0000 | ORAL_TABLET | Freq: Every day | ORAL | Status: DC

## 2015-09-08 MED ORDER — INSULIN ASPART 100 UNIT/ML ~~LOC~~ SOLN
0.0000 [IU] | Freq: Three times a day (TID) | SUBCUTANEOUS | Status: DC
  Administered 2015-09-08: 3 [IU] via SUBCUTANEOUS
  Administered 2015-09-09: 5 [IU] via SUBCUTANEOUS
  Administered 2015-09-09 (×2): 3 [IU] via SUBCUTANEOUS
  Administered 2015-09-10: 2 [IU] via SUBCUTANEOUS

## 2015-09-08 MED ORDER — LACTATED RINGERS IR SOLN
Status: DC | PRN
  Administered 2015-09-08: 1000 mL

## 2015-09-08 MED ORDER — HYDROMORPHONE HCL 1 MG/ML IJ SOLN: 0.2500 mg | INTRAMUSCULAR | Status: DC | PRN

## 2015-09-08 MED ORDER — DEXAMETHASONE SODIUM PHOSPHATE 10 MG/ML IJ SOLN
INTRAMUSCULAR | Status: AC
Start: 2015-09-08 — End: 2015-09-08
  Filled 2015-09-08: qty 1

## 2015-09-08 MED ORDER — DIPHENHYDRAMINE HCL 12.5 MG/5ML PO ELIX: 12.5000 mg | ORAL_SOLUTION | Freq: Four times a day (QID) | ORAL | Status: DC | PRN

## 2015-09-08 MED ORDER — FENTANYL CITRATE (PF) 250 MCG/5ML IJ SOLN
INTRAMUSCULAR | Status: AC
  Filled 2015-09-08: qty 5

## 2015-09-08 MED ORDER — CLONAZEPAM 1 MG PO TABS
2.0000 mg | ORAL_TABLET | Freq: Every day | ORAL | Status: DC
  Administered 2015-09-08: 2 mg via ORAL
  Filled 2015-09-08 (×2): qty 2

## 2015-09-08 MED ORDER — STERILE WATER FOR IRRIGATION IR SOLN
Status: DC | PRN
  Administered 2015-09-08: 2000 mL

## 2015-09-08 MED ORDER — GEMFIBROZIL 600 MG PO TABS
600.0000 mg | ORAL_TABLET | Freq: Two times a day (BID) | ORAL | Status: DC
Start: 2015-09-08 — End: 2015-09-10
  Administered 2015-09-08 – 2015-09-10 (×4): 600 mg via ORAL
  Filled 2015-09-08 (×4): qty 1

## 2015-09-08 MED ORDER — SUGAMMADEX SODIUM 200 MG/2ML IV SOLN
INTRAVENOUS | Status: AC
  Filled 2015-09-08: qty 2

## 2015-09-08 MED ORDER — LIDOCAINE HCL (CARDIAC) 20 MG/ML IV SOLN
INTRAVENOUS | Status: DC | PRN
  Administered 2015-09-08: 80 mg via INTRAVENOUS

## 2015-09-08 MED ORDER — PHENYLEPHRINE 40 MCG/ML (10ML) SYRINGE FOR IV PUSH (FOR BLOOD PRESSURE SUPPORT)
PREFILLED_SYRINGE | INTRAVENOUS | Status: AC
  Filled 2015-09-08: qty 20

## 2015-09-08 MED ORDER — LINAGLIPTIN 5 MG PO TABS
5.0000 mg | ORAL_TABLET | Freq: Every day | ORAL | Status: DC
  Administered 2015-09-09: 5 mg via ORAL
  Filled 2015-09-08: qty 1

## 2015-09-08 MED ORDER — PIOGLITAZONE HCL 15 MG PO TABS
15.0000 mg | ORAL_TABLET | Freq: Every day | ORAL | Status: DC
  Administered 2015-09-09 – 2015-09-10 (×2): 15 mg via ORAL
  Filled 2015-09-08 (×2): qty 1

## 2015-09-08 MED ORDER — ALBUMIN HUMAN 5 % IV SOLN
INTRAVENOUS | Status: DC | PRN
  Administered 2015-09-08: 12:00:00 via INTRAVENOUS

## 2015-09-08 MED ORDER — METFORMIN HCL ER 500 MG PO TB24
1000.0000 mg | ORAL_TABLET | Freq: Every day | ORAL | Status: DC
  Filled 2015-09-08: qty 2

## 2015-09-08 MED ORDER — BUPIVACAINE LIPOSOME 1.3 % IJ SUSP
20.0000 mL | Freq: Once | INTRAMUSCULAR | Status: AC
Start: 2015-09-08 — End: 2015-09-08
  Administered 2015-09-08: 20 mL
  Filled 2015-09-08: qty 20

## 2015-09-08 MED ORDER — SUGAMMADEX SODIUM 200 MG/2ML IV SOLN
INTRAVENOUS | Status: DC | PRN
  Administered 2015-09-08: 200 mg via INTRAVENOUS

## 2015-09-08 MED ORDER — METOCLOPRAMIDE HCL 5 MG/ML IJ SOLN: 10.0000 mg | Freq: Once | INTRAMUSCULAR | Status: DC | PRN

## 2015-09-08 MED ORDER — BUPIVACAINE-EPINEPHRINE (PF) 0.25% -1:200000 IJ SOLN
INTRAMUSCULAR | Status: AC
  Filled 2015-09-08: qty 30

## 2015-09-08 MED ORDER — ONDANSETRON HCL 4 MG/2ML IJ SOLN
INTRAMUSCULAR | Status: AC
Start: 2015-09-08 — End: 2015-09-08
  Filled 2015-09-08: qty 4

## 2015-09-08 MED ORDER — MIDAZOLAM HCL 5 MG/5ML IJ SOLN
INTRAMUSCULAR | Status: DC | PRN
  Administered 2015-09-08: 2 mg via INTRAVENOUS

## 2015-09-08 MED ORDER — MORPHINE SULFATE (PF) 2 MG/ML IV SOLN
2.0000 mg | INTRAVENOUS | Status: DC | PRN
  Administered 2015-09-08: 2 mg via INTRAVENOUS
  Filled 2015-09-08: qty 1

## 2015-09-08 MED ORDER — PROPOFOL 10 MG/ML IV BOLUS
INTRAVENOUS | Status: DC | PRN
  Administered 2015-09-08: 200 mg via INTRAVENOUS

## 2015-09-08 SURGICAL SUPPLY — 60 items
APL ESCP 34 STRL LF DISP (HEMOSTASIS) ×1
APL SRG 38 LTWT LNG FL B (MISCELLANEOUS) ×1
APPLICATOR ARISTA FLEXITIP XL (MISCELLANEOUS) ×3 IMPLANT
APPLICATOR SURGIFLO ENDO (HEMOSTASIS) ×3 IMPLANT
BAG SPEC RTRVL LRG 6X4 10 (ENDOMECHANICALS) ×1
CHLORAPREP W/TINT 26ML (MISCELLANEOUS) ×3 IMPLANT
CLIP LIGATING HEM O LOK PURPLE (MISCELLANEOUS) ×6 IMPLANT
CLIP LIGATING HEMO LOK XL GOLD (MISCELLANEOUS) IMPLANT
CLIP LIGATING HEMO O LOK GREEN (MISCELLANEOUS) ×3 IMPLANT
COVER SURGICAL LIGHT HANDLE (MISCELLANEOUS) ×3 IMPLANT
COVER TIP SHEARS 8 DVNC (MISCELLANEOUS) ×1 IMPLANT
COVER TIP SHEARS 8MM DA VINCI (MISCELLANEOUS) ×2
DECANTER SPIKE VIAL GLASS SM (MISCELLANEOUS) ×6 IMPLANT
DRAIN CHANNEL 15F RND FF 3/16 (WOUND CARE) ×3 IMPLANT
DRAPE ARM DVNC X/XI (DISPOSABLE) ×4 IMPLANT
DRAPE COLUMN DVNC XI (DISPOSABLE) ×1 IMPLANT
DRAPE DA VINCI XI ARM (DISPOSABLE) ×8
DRAPE DA VINCI XI COLUMN (DISPOSABLE) ×2
DRAPE INCISE IOBAN 66X45 STRL (DRAPES) ×3 IMPLANT
DRAPE LAPAROSCOPIC ABDOMINAL (DRAPES) ×3 IMPLANT
DRAPE SHEET LG 3/4 BI-LAMINATE (DRAPES) ×3 IMPLANT
ELECT PENCIL ROCKER SW 15FT (MISCELLANEOUS) ×3 IMPLANT
ELECT REM PT RETURN 9FT ADLT (ELECTROSURGICAL) ×3
ELECTRODE REM PT RTRN 9FT ADLT (ELECTROSURGICAL) ×1 IMPLANT
EVACUATOR SILICONE 100CC (DRAIN) ×3 IMPLANT
GLOVE BIO SURGEON STRL SZ 6.5 (GLOVE) ×2 IMPLANT
GLOVE BIO SURGEONS STRL SZ 6.5 (GLOVE) ×1
GLOVE BIOGEL M STRL SZ7.5 (GLOVE) ×6 IMPLANT
GOWN STRL REUS W/TWL LRG LVL3 (GOWN DISPOSABLE) ×9 IMPLANT
HEMOSTAT ARISTA ABSORB 3G PWDR (MISCELLANEOUS) ×3 IMPLANT
KIT BASIN OR (CUSTOM PROCEDURE TRAY) ×3 IMPLANT
LIQUID BAND (GAUZE/BANDAGES/DRESSINGS) ×3 IMPLANT
LOOP VESSEL MAXI BLUE (MISCELLANEOUS) ×3 IMPLANT
MARKER SKIN DUAL TIP RULER LAB (MISCELLANEOUS) ×3 IMPLANT
NS IRRIG 1000ML POUR BTL (IV SOLUTION) IMPLANT
POSITIONER SURGICAL ARM (MISCELLANEOUS) ×6 IMPLANT
POUCH SPECIMEN RETRIEVAL 10MM (ENDOMECHANICALS) ×3 IMPLANT
SEAL CANN UNIV 5-8 DVNC XI (MISCELLANEOUS) ×4 IMPLANT
SEAL XI 5MM-8MM UNIVERSAL (MISCELLANEOUS) ×8
SET TUBE IRRIG SUCTION NO TIP (IRRIGATION / IRRIGATOR) ×3 IMPLANT
SOLUTION ELECTROLUBE (MISCELLANEOUS) ×3 IMPLANT
SURGIFLO W/THROMBIN 8M KIT (HEMOSTASIS) ×3 IMPLANT
SUT ETHILON 3 0 PS 1 (SUTURE) ×3 IMPLANT
SUT MNCRL AB 4-0 PS2 18 (SUTURE) ×6 IMPLANT
SUT PROLENE 2 0 CT2 30 (SUTURE) ×3 IMPLANT
SUT V-LOC BARB 180 2/0GR6 GS22 (SUTURE) ×3
SUT VIC AB 3-0 SH 27 (SUTURE) ×6
SUT VIC AB 3-0 SH 27XBRD (SUTURE) ×2 IMPLANT
SUT VICRYL 0 UR6 27IN ABS (SUTURE) ×3 IMPLANT
SUT VLOC BARB 180 ABS3/0GR12 (SUTURE) ×6
SUTURE V-LC BRB 180 2/0GR6GS22 (SUTURE) ×1 IMPLANT
SUTURE VLOC BRB 180 ABS3/0GR12 (SUTURE) ×2 IMPLANT
TAPE STRIPS DRAPE STRL (GAUZE/BANDAGES/DRESSINGS) ×3 IMPLANT
TOWEL OR 17X26 10 PK STRL BLUE (TOWEL DISPOSABLE) ×3 IMPLANT
TOWEL OR NON WOVEN STRL DISP B (DISPOSABLE) ×3 IMPLANT
TRAY FOLEY W/METER SILVER 14FR (SET/KITS/TRAYS/PACK) IMPLANT
TRAY FOLEY W/METER SILVER 16FR (SET/KITS/TRAYS/PACK) ×3 IMPLANT
TRAY LAPAROSCOPIC (CUSTOM PROCEDURE TRAY) ×3 IMPLANT
TROCAR XCEL 12X100 BLDLESS (ENDOMECHANICALS) ×3 IMPLANT
WATER STERILE IRR 1500ML POUR (IV SOLUTION) ×3 IMPLANT

## 2015-09-08 NOTE — Transfer of Care (Signed)
Immediate Anesthesia Transfer of Care Note  Patient: Antonio Adams  Procedure(s) Performed: Procedure(s) with comments: XI ROBOTIC ASSITED RIGHT PARTIAL NEPHRECTOMY (Right) - Clamp on: 1033 Clamp off: 1103 Total Clamp Time: 30 minutes  Patient Location: PACU  Anesthesia Type:General  Level of Consciousness:  sedated, patient cooperative and responds to stimulation  Airway & Oxygen Therapy:Patient Spontanous Breathing and Patient connected to face mask oxgen  Post-op Assessment:  Report given to PACU RN and Post -op Vital signs reviewed and stable  Post vital signs:  Reviewed and stable  Last Vitals:  Filed Vitals:   09/08/15 0535  BP: 149/78  Pulse: 80  Temp: 36.9 C  Resp: 18    Complications: No apparent anesthesia complications

## 2015-09-08 NOTE — Interval H&P Note (Signed)
History and Physical Interval Note:  09/08/2015 6:58 AM  Antonio Adams  has presented today for surgery, with the diagnosis of right renal mass  The various methods of treatment have been discussed with the patient and family. After consideration of risks, benefits and other options for treatment, the patient has consented to  Procedure(s): XI ROBOTIC ASSITED RIGHT PARTIAL NEPHRECTOMY (Right) as a surgical intervention .  The patient's history has been reviewed, patient examined, no change in status, stable for surgery.  I have reviewed the patient's chart and labs.  Questions were answered to the patient's satisfaction.     Louis Meckel W

## 2015-09-08 NOTE — Op Note (Signed)
Preoperative diagnosis:  1. Right upper pole renal mass   Postoperative diagnosis:  1. Same   Procedure: 1. Robotic-assisted laparoscopic right partial nephrectomy 2. Intraoperative ultrasound used to define the borders of the renal mass.  Surgeon: [name], MD Resident surgeon: Nelwyn Salisbury, M.D. First Asst.: Debbrah Alar, PA-C  Anesthesia: General  Complications: None  Intraoperative findings:  #1: Small liver laceration which we were able to easily treat with electrocautery. #2. Tumor capsule rupture on the posterior aspect with hemorrhagic contents leaking into surgical field.  I was able to place a clip across the rupture and keep remaining contents within the tumor.  Area was copiously irrigated with sterile water at the end of the case. #3. Using the intraoperative ultrasound was able to delineate the base/margins of the renal mass which I marked prior to beginning the tumor resection. #4. Base of tumor margin was grossly negative. #5. Warm ischemia time was 30 minutes.  EBL: Minimal  Specimens: None  Indication: Antonio Adams is a 70 y.o. patient with 6.5cm cystic renal mass in the upper pole of his right kidney.  After reviewing the management options for treatment, he elected to proceed with the above surgical procedure(s). We have discussed the potential benefits and risks of the procedure, side effects of the proposed treatment, the likelihood of the patient achieving the goals of the procedure, and any potential problems that might occur during the procedure or recuperation. Informed consent has been obtained.  Description of procedure:  The patient was taken to the operating room and a general anesthetic was administered. The patient was given preoperative antibiotics, placed in the right modified flank position with care to pad all potential pressure points, and prepped and draped in the usual sterile fashion. Next a preoperative timeout was  performed.  A site was selected on the right side of the umbilicus for placement of the camera port. This was placed using a standard cut down technique incising the peritoneum sharply then passing the 60mm trocar. We entered the peritoneum without incident and established pneumoperitoneum. The camera was then used to inspect the abdomen and there was no evidence of any intra-abdominal injuries or other abnormalities. The remaining abdominal ports were then placed. 8 mm robotic ports were placed in the right upper quadrant, right lower quadrant, and far right lateral abdominal wall - forming a soft J down the abdomen.  A 12 mm port was placed in the upper midline for laparoscopic assistance. The surgical cart was then docked.   Utilizing the cautery scissors, the white line of Toldt was incised allowing the colon to be mobilized medially and the plane between the mesocolon and the anterior layer of Gerota's fascia to be developed and the kidney to be exposed. The ureter and gonadal vein were identified inferiorly and the ureter was lifted anteriorly off the psoas muscle. Dissection proceeded superiorly along the gonadal vein until the renal vein was identified. The renal hilum was then carefully isolated with a combination of blunt and sharp dissection allowing the renal arterial and venous structures to be separated and isolated in preparation for renal hilar vessel clamping.  Attention turned to the kidney and the perinephric fat surrounding the kidney.  The patient had extensive amount of gerotas fascia.  Given location of the renal mass I had to basically defat the kidney so that it could be rotated/flipped during the partial nephrectomy part of the case.  The posterior superior portion of gerotas was extremely difficulty to access due  to the size of the tumor.  Unfortunately during the dissection of this part of fat I punctured the tumor and there some hemorrhagic material that leaked into the surgical  bed.  I was able to clip the ruptured site and keep the remaining contents within the tumor.  Once the the kidney was mobilized sufficiently for exposure I used the ultrasound to help identify the renal mass margins which I then marked with cautery.  12.5 g of IV mannitol was then administered.   Once the renal mass was properly isolated, preparations were made for resection of the tumor. The renal artery was then clamped with two bulldog clamps. The tumor was then excised with cold scissor dissection along with an adequate visible gross margin of normal renal parenchyma. The tumor appeared to be excised without any gross violation of the tumor. The renal collecting system was not entered during removal of the tumor. A running 3-0 V-lock suture was then brought through the capsule of the kidney and run along the base of the renal defect to provide hemostasis and close any entry into the renal collecting system if present. Weck clips were used to secure this suture outside the renal capsule at the proximal and distal ends. A running 2-0 V lock suture was then used to close the capsule of the kidney using a sliding clip technique which resulted in excellent hemostasis. The bulldog clamps were then removed from the renal hilar vessel and an additional 12.5 g of IV mannitol was administered. An additional hemostatic agent (Surgiflo) was then placed into the renal defect.  Some of the product was used to help control the liver laceration that occurred incidentally during the tumor resection.  Arista was then used over top of the surgiflo and also over there renal hilum.    Total warm renal ischemia time was 30 minutes. The renal tumor resection site was examined. Hemostasis appeared adequate.   The kidney was placed back into its normal anatomic position and covered with perinephric fat as needed. A # 31 Blake drain was then brought through the lateral lower port site and positioned in the perinephric  space. It was secured to the skin with a nylon suture. The surgical robotic cart was undocked. The renal tumor specimen was removed intact within an endopouch retrieval bag via the camera port sites. The camera port site and the other 12 mm port site were then closed at the fascial level with 0-vicryl suture. All other laparoscopic/robotic ports were removed under direct vision and the pneumoperitoneum let down with inspection of the operative field performed and hemostasis again confirmed. All incision sites were then injected with local anesthetic and reapproximated at the skin level with 4-0 monocryl subcuticular closures. Dermabond was applied to the skin. The patient tolerated the procedure well and without complications. The patient was able to be extubated and transferred to the recovery unit in satisfactory condition.  [name], M.D.

## 2015-09-08 NOTE — Discharge Instructions (Signed)

## 2015-09-08 NOTE — Anesthesia Procedure Notes (Signed)
Procedure Name: Intubation Date/Time: 09/08/2015 8:18 AM Performed by: Freddie Breech Pre-anesthesia Checklist: Patient identified, Emergency Drugs available, Suction available, Patient being monitored and Timeout performed Patient Re-evaluated:Patient Re-evaluated prior to inductionOxygen Delivery Method: Circle system utilized Preoxygenation: Pre-oxygenation with 100% oxygen Intubation Type: IV induction Ventilation: Mask ventilation without difficulty and Oral airway inserted - appropriate to patient size Laryngoscope Size: Glidescope and 3 Tube type: Oral Tube size: 7.5 mm Number of attempts: 2 (CRNA attempted to DVL and unable to achieve adequate view.  Glidescope used to place ETT. ) Airway Equipment and Method: Patient positioned with wedge pillow and Stylet Placement Confirmation: ETT inserted through vocal cords under direct vision,  positive ETCO2,  CO2 detector and breath sounds checked- equal and bilateral Secured at: 23 cm Tube secured with: Tape Dental Injury: Teeth and Oropharynx as per pre-operative assessment  Difficulty Due To: Difficulty was anticipated

## 2015-09-08 NOTE — Anesthesia Postprocedure Evaluation (Signed)
Anesthesia Post Note  Patient: [name]  Procedure(s) Performed: Procedure(s) (LRB): XI ROBOTIC ASSITED RIGHT PARTIAL NEPHRECTOMY (Right)  Patient location during evaluation: PACU Anesthesia Type: General Level of consciousness: awake and alert Pain management: pain level controlled Vital Signs Assessment: post-procedure vital signs reviewed and stable Respiratory status: spontaneous breathing, nonlabored ventilation, respiratory function stable and patient connected to nasal cannula oxygen Cardiovascular status: blood pressure returned to baseline and stable Postop Assessment: no signs of nausea or vomiting Anesthetic complications: no    Last Vitals:  Filed Vitals:   09/08/15 1400 09/08/15 1418  BP: 127/78 130/79  Pulse: 79 85  Temp:  36.7 C  Resp: 21     Last Pain:  Filed Vitals:   09/08/15 1419  PainSc: [facility]

## 2015-09-09 LAB — BASIC METABOLIC PANEL
Anion gap: 8 (ref 5–15)
BUN: 36 mg/dL — AB (ref 6–20)
CALCIUM: 8.2 mg/dL — AB (ref 8.9–10.3)
CO2: 23 mmol/L (ref 22–32)
Chloride: 103 mmol/L (ref 101–111)
Creatinine, Ser: 1.99 mg/dL — ABNORMAL HIGH (ref 0.61–1.24)
GFR calc Af Amer: 38 mL/min — ABNORMAL LOW (ref >60)
GFR, EST NON AFRICAN AMERICAN: 33 mL/min — AB (ref >60)
GLUCOSE: 179 mg/dL — AB (ref 65–99)
Potassium: 5.6 mmol/L — ABNORMAL HIGH (ref 3.5–5.1)
Sodium: 134 mmol/L — ABNORMAL LOW (ref 135–145)

## 2015-09-09 LAB — HEMOGLOBIN AND HEMATOCRIT, BLOOD
HCT: 23 % — ABNORMAL LOW (ref 39.0–52.0)
HEMATOCRIT: 23 % — AB (ref 39.0–52.0)
HEMOGLOBIN: 7.8 g/dL — AB (ref 13.0–17.0)
Hemoglobin: 8.2 g/dL — ABNORMAL LOW (ref 13.0–17.0)

## 2015-09-09 LAB — GLUCOSE, CAPILLARY
GLUCOSE-CAPILLARY: 115 mg/dL — AB (ref 65–99)
GLUCOSE-CAPILLARY: 169 mg/dL — AB (ref 65–99)
GLUCOSE-CAPILLARY: 171 mg/dL — AB (ref 65–99)
Glucose-Capillary: 203 mg/dL — ABNORMAL HIGH (ref 65–99)

## 2015-09-09 MED ORDER — CLONAZEPAM 1 MG PO TABS: 2.0000 mg | ORAL_TABLET | Freq: Every day | ORAL | Status: DC | PRN

## 2015-09-09 MED ORDER — ACETAMINOPHEN 325 MG PO TABS
650.0000 mg | ORAL_TABLET | ORAL | Status: DC | PRN
  Administered 2015-09-09 – 2015-09-10 (×2): 650 mg via ORAL
  Filled 2015-09-09 (×2): qty 2

## 2015-09-09 NOTE — Clinical Documentation Improvement (Signed)
Urology  Abnormal Lab/Test Results:   Component     Latest Ref Rng 09/08/2015 09/09/2015 09/09/2015          5:01 AM  1:44 PM  Hemoglobin     13.0 - 17.0 g/dL 11.0 (L) 8.2 (L) 7.8 (L)  HCT     39.0 - 52.0 % 32.0 (L) 23.0 (L) 23.0 (L)    Possible Clinical Conditions associated with below indicators  ABLA   Postoperative blood loss anemia  Other Condition  Cannot Clinically Determine  Supporting Information: Post Op day 1      Please exercise your independent, professional judgment when responding. A specific answer is not anticipated or expected. Please update your documentation within the medical record to reflect your response to this query. Thank you  Thank You,  [place] [phone]

## 2015-09-09 NOTE — Progress Notes (Signed)
1 Day Post-Op Subjective: The patient is doing well.  No nausea or vomiting. Pain is adequately controlled.  Objective: Vital signs in last 24 hours: Temp:  [97.5 F (36.4 C)-98.7 F (37.1 C)] 98.2 F (36.8 C) (04/27 0514) Pulse Rate:  [79-92] 82 (04/27 0514) Resp:  [15-28] 20 (04/27 0514) BP: (90-141)/(51-88) 91/52 mmHg (04/27 0514) SpO2:  [89 %-100 %] 97 % (04/27 0514)  Intake/Output from previous day: 04/26 0701 - 04/27 0700 In: 3700 [I.V.:3450; IV Piggyback:250] Out: 3100 [Urine:1875; Drains:525; Blood:700] Intake/Output this shift:    Physical Exam:  General: Alert and oriented. CV: RRR Lungs: Clear bilaterally. GI: Soft, Nondistended. Incisions: Clean and dry. Urine: Clear Extremities: Nontender, no erythema, no edema.  Lab Results:  Recent Labs  09/08/15 1216 09/09/15 0501  HGB 11.0* 8.2*  HCT 32.0* 23.0*          Recent Labs  09/09/15 0501  CREATININE 1.99*           Results for orders placed or performed during the hospital encounter of 09/08/15 (from the past 24 hour(s))  Glucose, capillary     Status: Abnormal   Collection Time: 09/08/15 12:10 PM  Result Value Ref Range   Glucose-Capillary 225 (H) 65 - 99 mg/dL  Hemoglobin and hematocrit, blood     Status: Abnormal   Collection Time: 09/08/15 12:16 PM  Result Value Ref Range   Hemoglobin 11.0 (L) 13.0 - 17.0 g/dL   HCT 32.0 (L) 39.0 - 52.0 %  Glucose, capillary     Status: Abnormal   Collection Time: 09/08/15  4:53 PM  Result Value Ref Range   Glucose-Capillary 189 (H) 65 - 99 mg/dL  Glucose, capillary     Status: Abnormal   Collection Time: 09/08/15  9:30 PM  Result Value Ref Range   Glucose-Capillary 222 (H) 65 - 99 mg/dL  Basic metabolic panel     Status: Abnormal   Collection Time: 09/09/15  5:01 AM  Result Value Ref Range   Sodium 134 (L) 135 - 145 mmol/L   Potassium 5.6 (H) 3.5 - 5.1 mmol/L   Chloride 103 101 - 111 mmol/L   CO2 23 22 - 32 mmol/L   Glucose, Bld 179 (H) 65 - 99  mg/dL   BUN 36 (H) 6 - 20 mg/dL   Creatinine, Ser 1.99 (H) 0.61 - 1.24 mg/dL   Calcium 8.2 (L) 8.9 - 10.3 mg/dL   GFR calc non Af Amer 33 (L) >60 mL/min   GFR calc Af Amer 38 (L) >60 mL/min   Anion gap 8 5 - 15  Hemoglobin and hematocrit, blood     Status: Abnormal   Collection Time: 09/09/15  5:01 AM  Result Value Ref Range   Hemoglobin 8.2 (L) 13.0 - 17.0 g/dL   HCT 23.0 (L) 39.0 - 52.0 %    Assessment/Plan: POD# 1 s/p robotic partial nephrectomy.  1) Ambulate, Incentive spirometry 2) Advance diet as tolerated 3) Transition to oral pain medication 4)Repeat hgb 5) D/C urethral catheter   LOS: 1 day   Antonio Adams 09/09/2015, 7:26 AM

## 2015-09-09 NOTE — Clinical Documentation Improvement (Signed)
Urology  Documentation indicates  small liver laceration.  Please further specify degree of laceration if possible.    Minor  Moderate  Major  Other  Clinically Undetermined   Supporting Information: Op Note 09/08/15  Please exercise your independent, professional judgment when responding. A specific answer is not anticipated or expected. Please update your documentation within the medical record to reflect your response to this query. Thank you  Thank You,  [place] [phone]

## 2015-09-09 NOTE — Progress Notes (Signed)
Pt refused CPAP for the night, RT to monitor and assess as needed.

## 2015-09-09 NOTE — Progress Notes (Signed)
Patient complaining of dizzy and lightheadedness when standing up beside the bed. Blood pressure reading 81/46.  MD made aware.  No new orders.  Will continue to monitor closely.

## 2015-09-10 ENCOUNTER — Telehealth: Payer: Self-pay | Admitting: *Deleted

## 2015-09-10 LAB — BASIC METABOLIC PANEL
ANION GAP: 8 (ref 5–15)
BUN: 36 mg/dL — AB (ref 6–20)
CALCIUM: 8.5 mg/dL — AB (ref 8.9–10.3)
CO2: 22 mmol/L (ref 22–32)
CREATININE: 1.86 mg/dL — AB (ref 0.61–1.24)
Chloride: 103 mmol/L (ref 101–111)
GFR calc Af Amer: 41 mL/min — ABNORMAL LOW (ref >60)
GFR, EST NON AFRICAN AMERICAN: 35 mL/min — AB (ref >60)
GLUCOSE: 168 mg/dL — AB (ref 65–99)
Potassium: 4.8 mmol/L (ref 3.5–5.1)
Sodium: 133 mmol/L — ABNORMAL LOW (ref 135–145)

## 2015-09-10 LAB — GLUCOSE, CAPILLARY
GLUCOSE-CAPILLARY: 154 mg/dL — AB (ref 65–99)
GLUCOSE-CAPILLARY: 145 mg/dL — AB (ref 65–99)

## 2015-09-10 LAB — CBC
HEMATOCRIT: 22.4 % — AB (ref 39.0–52.0)
Hemoglobin: 7.8 g/dL — ABNORMAL LOW (ref 13.0–17.0)
MCH: 28.9 pg (ref 26.0–34.0)
MCHC: 34.8 g/dL (ref 30.0–36.0)
MCV: 83 fL (ref 78.0–100.0)
Platelets: 148 10*3/uL — ABNORMAL LOW (ref 150–400)
RBC: 2.7 MIL/uL — ABNORMAL LOW (ref 4.22–5.81)
RDW: 14.7 % (ref 11.5–15.5)
WBC: 6.2 10*3/uL (ref 4.0–10.5)

## 2015-09-10 NOTE — Discharge Summary (Signed)
Date of admission: 09/08/2015  Date of discharge: 09/11/2015  Admission diagnosis: right renal mass, s/p RAL- right partial nephrectomy  Discharge diagnosis: same, anemia from acute surgical blood loss, diabetes  Secondary diagnoses:  Patient Active Problem List   Diagnosis Date Noted  . Renal mass 09/08/2015  . Varicose veins of both lower extremities 08/06/2015  . Varicose veins of lower extremities with ulcer ([place]) 06/16/2015  . Right renal mass 06/14/2015  . Urinary incontinence 09/15/2014  . Other constipation 08/03/2014  . Hemorrhoids, internal, with bleeding 08/03/2014  . Post traumatic stress disorder (PTSD) 01/29/2014  . Pernicious anemia 12/10/2013  . Radiation proctitis 06/04/2012  . Spinal stenosis of lumbar region with radiculopathy 10/30/2011    Class: Chronic  . OSA (obstructive sleep apnea) 11/24/2010  . Obesity, Class I, BMI 30-34.9 11/24/2010  . Routine general medical examination at a health care facility 11/24/2010  . ADENOCARCINOMA, PROSTATE, GLEASON GRADE 6 12/21/2009  . Type II diabetes mellitus with manifestations ([place]) 03/16/2007  . Hyperlipidemia with target LDL less than 100 03/16/2007  . Essential hypertension 03/16/2007  . GERD 03/16/2007    History and Physical: For full details, please see admission history and physical. Briefly, Antonio Adams is a 70 y.o. year old patient with right renal mass.   Hospital Course: Patient tolerated the procedure well.  He was then transferred to the floor after an uneventful PACU stay.  His hospital course was uncomplicated. He was orthostatic on POD#1, which resolved with time and more IVF.   On POD#2  he had met discharge criteria: was eating a regular diet, was up and ambulating independently,  pain was well controlled, was voiding without a catheter, and was ready to for discharge.   Laboratory values:   Recent Labs  09/09/15 0501 09/09/15 1344 09/10/15 0715  WBC  --   --  6.2  HGB 8.2* 7.8* 7.8*   HCT 23.0* 23.0* 22.4*    Recent Labs  09/09/15 0501 09/10/15 0715  NA 134* 133*  K 5.6* 4.8  CL 103 103  CO2 23 22  GLUCOSE 179* 168*  BUN 36* 36*  CREATININE 1.99* 1.86*  CALCIUM 8.2* 8.5*   No results for input(s): LABPT, INR in the last 72 hours. No results for input(s): LABURIN in the last 72 hours. Results for orders placed or performed during the hospital encounter of 09/02/15  Urine culture     Status: None   Collection Time: 09/02/15  2:32 PM  Result Value Ref Range Status   Specimen Description URINE, CLEAN CATCH  Final   Special Requests NONE  Final   Culture   Final    NO GROWTH 1 DAY Performed at [facility]    Report Status 09/03/2015 FINAL  Final    Disposition: Home  Discharge instruction: The patient was instructed to be ambulatory but told to refrain from heavy lifting, strenuous activity, or driving.   Discharge medications:    Medication List    STOP taking these medications        aspirin EC 81 MG tablet     Cholecalciferol 1000 units capsule     [employer] FISH OIL PO     ibuprofen 800 MG tablet  Commonly known as:  ADVIL,MOTRIN     meloxicam 7.5 MG tablet  Commonly known as:  MOBIC     multivitamin-iron-minerals-folic acid chewable tablet      TAKE these medications        atorvastatin 20 MG tablet  Commonly  known as:  LIPITOR  Take 1 tablet (20 mg total) by mouth daily.     clonazePAM 2 MG tablet  Commonly known as:  KLONOPIN  Take 2 mg by mouth daily. Reported on 08/06/2015     clotrimazole-betamethasone cream  Commonly known as:  LOTRISONE  Apply 1 application topically 2 (two) times daily.     diltiazem 120 MG 24 hr capsule  Commonly known as:  DILACOR XR  Take 120 mg by mouth daily.     gabapentin 100 MG capsule  Commonly known as:  NEURONTIN  Take 1 capsule (100 mg total) by mouth at bedtime.     gemfibrozil 600 MG tablet  Commonly known as:  LOPID  Take 600 mg by mouth 2 (two) times daily.     glucose  blood test strip  Commonly known as:  ONE TOUCH ULTRA TEST  1 each by Other route 2 (two) times daily. And lancets 2/day 250.03     HYDROcodone-acetaminophen 5-325 MG tablet  Commonly known as:  NORCO  Take 1-2 tablets by mouth every 6 (six) hours as needed for moderate pain.     Insulin Glargine 100 UNIT/ML Solostar Pen  Commonly known as:  LANTUS SOLOSTAR  Inject 15 Units into the skin every morning.     Insulin Pen Needle 32G X 4 MM Misc  Commonly known as:  NOVOFINE PLUS  Use daily with insulin     lisinopril 40 MG tablet  Commonly known as:  PRINIVIL,ZESTRIL  Take 20 mg by mouth daily.     methocarbamol 500 MG tablet  Commonly known as:  ROBAXIN  Take 500 mg by mouth every 6 (six) hours as needed for muscle spasms.     Needles & Syringes Misc  Use syringe and needle to inject insulin into the skin as directed on insulin prescription. DX E11.09     omeprazole 40 MG capsule  Commonly known as:  PRILOSEC  TAKE ONE CAPSULE EVERY DAY     pioglitazone 15 MG tablet  Commonly known as:  ACTOS  Take 15 mg by mouth daily.     sertraline 100 MG tablet  Commonly known as:  ZOLOFT  Take 100 tablets by mouth daily. Reported on 08/06/2015     SitaGLIPtin-MetFORMIN HCl 50-1000 MG Tb24  Commonly known as:  JANUMET XR  Take 1 tablet by mouth daily.     tamsulosin 0.4 MG Caps capsule  Commonly known as:  FLOMAX  TAKE 1 CAPSULE (0.4 MG TOTAL) BY MOUTH DAILY.     zolpidem 10 MG tablet  Commonly known as:  AMBIEN  Take 10 mg by mouth at bedtime as needed for sleep.        Followup:  Follow-up Information    Follow up with [name], MD On 09/21/2015.   Specialty:  Urology   Why:  12:45pm    Contact information:   [facility] [place] [id number] [phone]

## 2015-09-10 NOTE — Care Management Note (Signed)
Case Management Note  Patient Details  Name: Antonio Adams MRN: [id number] Date of Birth: [birth year]  Subjective/Objective: 70 y/o m admitted w/renal mass. From home.                   Action/Plan:d/c plan home no needs or orders.   Expected Discharge Date:                  Expected Discharge Plan:  Home/Self Care  In-House Referral:     Discharge planning Services  CM Consult  Post Acute Care Choice:    Choice offered to:     DME Arranged:    DME Agency:     HH Arranged:    [place] Agency:     Status of Service:  Completed, signed off  Medicare Important Message Given:    Date Medicare IM Given:    Medicare IM give by:    Date Additional Medicare IM Given:    Additional Medicare Important Message give by:     If discussed at [place] of Stay Meetings, dates discussed:    Additional Comments:  Dessa Phi, RN 09/10/2015, 10:26 AM

## 2015-09-10 NOTE — Telephone Encounter (Signed)
Pt was on TCM list admitted for (R) renal mass, she had surgery partial nephrectomy. Pt was D/C 09/10/15 will f/u w/surgeon Dr. Louis Meckel 09/21/15..../LMB

## 2015-09-13 NOTE — Progress Notes (Signed)
  PATHOLOGY REPORT FINAL DIAGNOSIS Diagnosis Kidney, wedge excision / partial resection PAPILLARY RENAL CELL CARCINOMA (6 CM), WHO NUCLEAR GRADE 3 (TYPE 2) THE TUMOR IS CONFINED TO THE KIDNEY

## 2015-09-15 ENCOUNTER — Emergency Department (HOSPITAL_COMMUNITY)
Admission: EM | Admit: 2015-09-15 | Discharge: 2015-09-15 | Disposition: A | Discharge status: Home / Self Care | Payer: Medicare Other | Attending: Emergency Medicine | Admitting: Emergency Medicine

## 2015-09-15 ENCOUNTER — Encounter (HOSPITAL_COMMUNITY): Payer: Self-pay | Admitting: Emergency Medicine

## 2015-09-15 ENCOUNTER — Emergency Department (HOSPITAL_COMMUNITY): Payer: Medicare Other

## 2015-09-15 DIAGNOSIS — R0602 Shortness of breath: Secondary | ICD-10-CM | POA: Insufficient documentation

## 2015-09-15 DIAGNOSIS — Z7982 Long term (current) use of aspirin: Secondary | ICD-10-CM | POA: Insufficient documentation

## 2015-09-15 DIAGNOSIS — M4690 Unspecified inflammatory spondylopathy, site unspecified: Secondary | ICD-10-CM | POA: Diagnosis not present

## 2015-09-15 DIAGNOSIS — Z7984 Long term (current) use of oral hypoglycemic drugs: Secondary | ICD-10-CM | POA: Diagnosis not present

## 2015-09-15 DIAGNOSIS — E119 Type 2 diabetes mellitus without complications: Secondary | ICD-10-CM | POA: Insufficient documentation

## 2015-09-15 DIAGNOSIS — Z79891 Long term (current) use of opiate analgesic: Secondary | ICD-10-CM | POA: Diagnosis not present

## 2015-09-15 DIAGNOSIS — R079 Chest pain, unspecified: Secondary | ICD-10-CM | POA: Diagnosis not present

## 2015-09-15 DIAGNOSIS — I10 Essential (primary) hypertension: Secondary | ICD-10-CM | POA: Diagnosis not present

## 2015-09-15 DIAGNOSIS — H409 Unspecified glaucoma: Secondary | ICD-10-CM | POA: Insufficient documentation

## 2015-09-15 DIAGNOSIS — F329 Major depressive disorder, single episode, unspecified: Secondary | ICD-10-CM | POA: Insufficient documentation

## 2015-09-15 DIAGNOSIS — D649 Anemia, unspecified: Secondary | ICD-10-CM

## 2015-09-15 DIAGNOSIS — Z8546 Personal history of malignant neoplasm of prostate: Secondary | ICD-10-CM | POA: Diagnosis not present

## 2015-09-15 DIAGNOSIS — E785 Hyperlipidemia, unspecified: Secondary | ICD-10-CM | POA: Insufficient documentation

## 2015-09-15 DIAGNOSIS — R531 Weakness: Secondary | ICD-10-CM | POA: Diagnosis not present

## 2015-09-15 DIAGNOSIS — K219 Gastro-esophageal reflux disease without esophagitis: Secondary | ICD-10-CM | POA: Diagnosis not present

## 2015-09-15 DIAGNOSIS — Z794 Long term (current) use of insulin: Secondary | ICD-10-CM | POA: Diagnosis not present

## 2015-09-15 DIAGNOSIS — Z79899 Other long term (current) drug therapy: Secondary | ICD-10-CM | POA: Diagnosis not present

## 2015-09-15 LAB — CBC WITH DIFFERENTIAL/PLATELET
Basophils Absolute: 0 10*3/uL (ref 0.0–0.1)
Basophils Relative: 0 %
EOS ABS: 0.1 10*3/uL (ref 0.0–0.7)
Eosinophils Relative: 1 %
HEMATOCRIT: 23 % — AB (ref 39.0–52.0)
HEMOGLOBIN: 8.1 g/dL — AB (ref 13.0–17.0)
LYMPHS ABS: 0.9 10*3/uL (ref 0.7–4.0)
Lymphocytes Relative: 15 %
MCH: 29 pg (ref 26.0–34.0)
MCHC: 35.2 g/dL (ref 30.0–36.0)
MCV: 82.4 fL (ref 78.0–100.0)
Monocytes Absolute: 1 10*3/uL (ref 0.1–1.0)
Monocytes Relative: 16 %
NEUTROS PCT: 68 %
Neutro Abs: 4.1 10*3/uL (ref 1.7–7.7)
Platelets: 312 10*3/uL (ref 150–400)
RBC: 2.79 MIL/uL — ABNORMAL LOW (ref 4.22–5.81)
RDW: 14.3 % (ref 11.5–15.5)
WBC: 6.1 10*3/uL (ref 4.0–10.5)

## 2015-09-15 LAB — COMPREHENSIVE METABOLIC PANEL
ALK PHOS: 73 U/L (ref 38–126)
ALT: 14 U/L — AB (ref 17–63)
ANION GAP: 11 (ref 5–15)
AST: 14 U/L — ABNORMAL LOW (ref 15–41)
Albumin: 3.2 g/dL — ABNORMAL LOW (ref 3.5–5.0)
BILIRUBIN TOTAL: 0.5 mg/dL (ref 0.3–1.2)
BUN: 23 mg/dL — ABNORMAL HIGH (ref 6–20)
CALCIUM: 10.3 mg/dL (ref 8.9–10.3)
CO2: 24 mmol/L (ref 22–32)
CREATININE: 1.54 mg/dL — AB (ref 0.61–1.24)
Chloride: 101 mmol/L (ref 101–111)
GFR calc non Af Amer: 44 mL/min — ABNORMAL LOW (ref >60)
GFR, EST AFRICAN AMERICAN: 51 mL/min — AB (ref >60)
Glucose, Bld: 162 mg/dL — ABNORMAL HIGH (ref 65–99)
Potassium: 4.7 mmol/L (ref 3.5–5.1)
SODIUM: 136 mmol/L (ref 135–145)
TOTAL PROTEIN: 7.5 g/dL (ref 6.5–8.1)

## 2015-09-15 LAB — TYPE AND SCREEN
ABO/RH(D): O POS
Antibody Screen: NEGATIVE

## 2015-09-15 LAB — URINALYSIS, ROUTINE W REFLEX MICROSCOPIC
BILIRUBIN URINE: NEGATIVE
GLUCOSE, UA: NEGATIVE mg/dL
KETONES UR: NEGATIVE mg/dL
Leukocytes, UA: NEGATIVE
NITRITE: NEGATIVE
PH: 6 (ref 5.0–8.0)
PROTEIN: 30 mg/dL — AB
Specific Gravity, Urine: 1.018 (ref 1.005–1.030)

## 2015-09-15 LAB — URINE MICROSCOPIC-ADD ON

## 2015-09-15 LAB — I-STAT CG4 LACTIC ACID, ED: Lactic Acid, Venous: 1.16 mmol/L (ref 0.5–2.0)

## 2015-09-15 LAB — TROPONIN I: Troponin I: <0.03 ng/mL (ref <0.031)

## 2015-09-15 MED ORDER — SODIUM CHLORIDE 0.9 % IV SOLN
INTRAVENOUS | Status: DC
  Administered 2015-09-15: 15:00:00 via INTRAVENOUS

## 2015-09-15 NOTE — Discharge Instructions (Signed)
Follow-up with your doctor for a repeat  hemoglobin in 1 week. Return here at once for worsening weakness, feeling like going to pass out, or any other problems. Call your urologist, Dr. Louis Meckel, tomorrow for follow-up on your low blood count Anemia, Nonspecific Anemia is a condition in which the concentration of red blood cells or hemoglobin in the blood is below normal. Hemoglobin is a substance in red blood cells that carries oxygen to the tissues of the body. Anemia results in not enough oxygen reaching these tissues.  CAUSES  Common causes of anemia include:   Excessive bleeding. Bleeding may be internal or external. This includes excessive bleeding from periods (in women) or from the intestine.   Poor nutrition.   Chronic kidney, thyroid, and liver disease.  Bone marrow disorders that decrease red blood cell production.  Cancer and treatments for cancer.  HIV, AIDS, and their treatments.  Spleen problems that increase red blood cell destruction.  Blood disorders.  Excess destruction of red blood cells due to infection, medicines, and autoimmune disorders. SIGNS AND SYMPTOMS   Minor weakness.   Dizziness.   Headache.  Palpitations.   Shortness of breath, especially with exercise.   Paleness.  Cold sensitivity.  Indigestion.  Nausea.  Difficulty sleeping.  Difficulty concentrating. Symptoms may occur suddenly or they may develop slowly.  DIAGNOSIS  Additional blood tests are often needed. These help your health care provider determine the best treatment. Your health care provider will check your stool for blood and look for other causes of blood loss.  TREATMENT  Treatment varies depending on the cause of the anemia. Treatment can include:   Supplements of iron, vitamin 123456, or folic acid.   Hormone medicines.   A blood transfusion. This may be needed if blood loss is severe.   Hospitalization. This may be needed if there is significant  continual blood loss.   Dietary changes.  Spleen removal. HOME CARE INSTRUCTIONS Keep all follow-up appointments. It often takes many weeks to correct anemia, and having your health care provider check on your condition and your response to treatment is very important. SEEK IMMEDIATE MEDICAL CARE IF:   You develop extreme weakness, shortness of breath, or chest pain.   You become dizzy or have trouble concentrating.  You develop heavy vaginal bleeding.   You develop a rash.   You have bloody or black, tarry stools.   You faint.   You vomit up blood.   You vomit repeatedly.   You have abdominal pain.  You have a fever or persistent symptoms for more than 2-3 days.   You have a fever and your symptoms suddenly get worse.   You are dehydrated.  MAKE SURE YOU:  Understand these instructions.  Will watch your condition.  Will get help right away if you are not doing well or get worse.   This information is not intended to replace advice given to you by your health care provider. Make sure you discuss any questions you have with your health care provider.   Document Released: 06/08/2004 Document Revised: 01/01/2013 Document Reviewed: 10/25/2012 Elsevier Interactive Patient Education [employer].

## 2015-09-15 NOTE — ED Notes (Signed)
Patient transported to X-ray 

## 2015-09-15 NOTE — ED Notes (Signed)
Patient here with complaints of weakness. Reports kidney removal. States that he "just doesn't feel very good".

## 2015-09-15 NOTE — ED Provider Notes (Addendum)
CSN: [id number]     Arrival date & time 09/15/15  1326 History   First MD Initiated Contact with Patient 09/15/15 1441     Chief Complaint  Patient presents with  . Weakness     (Consider location/radiation/quality/duration/timing/severity/associated sxs/prior Treatment) HPI Comments: Patient here complaining of diffuse weakness times several days. Recently diagnosed with renal cell carcinoma. Went to the [place] today for follow-up visit and was found to be anemic and was told to come here. Patient endorses dyspnea on exertion. No cough or congestion. No chest pain. Denies any abdominal discomfort other than postsurgical pain. Denies any hematuria. Symptoms have been progressively worse and nothing makes them better. No treatment used prior to arrival  Patient is a 70 y.o. male presenting with weakness. The history is provided by the patient.  Weakness    Past Medical History  Diagnosis Date  . HYPERLIPIDEMIA 03/16/2007    takes Crestor daily  . CARPAL TUNNEL SYNDROME, BILATERAL 03/16/2007  . ALLERGIC RHINITIS 03/16/2007  . BACK PAIN WITH RADICULOPATHY 11/23/2008  . LEG CRAMPS 05/22/2007  . PARESTHESIA 05/21/2007  . GASTROINTESTINAL HEMORRHAGE, HX OF 03/16/2007  . Overactive bladder   . Hemorrhoids   . HYPERTENSION 03/16/2007    takes Diltiazem and Lisinopril daily   . Sleep apnea   . Chronic back pain   . Arthritis     back   . GERD 03/16/2007    takes Omeprazole daily  . Rectal bleeding     Dr.Norins has explained its from the Radiation that he has received  . History of colon polyps   . Urinary frequency     takes Toviaz daily  . Nocturia   . DIABETES MELLITUS, TYPE II 03/16/2007    takes Januvia and MEtformin daily  . Constipation     takes Senokot daily  . Glaucoma     mild - no eye drops  . Depression     occasionally  . Proctitis   . ADENOCARCINOMA, PROSTATE, GLEASON GRADE 6 12/21/2009   Past Surgical History  Procedure Laterality Date  . Back surgery  1983, 2008     repeat surgery; ESI '08  . Shoulder surgery  LFT  . Carpal tunnel release      bilateral  . Stress cardiolite  12/24/2003    negative for ischemia  . Wound exploration      management of a wound that he sustained in the [employer]  . Prostate surgery  march 2012    seed implant  . Cervical fusion    . Lumbar laminectomy  10/30/2011    Procedure: MICRODISCECTOMY LUMBAR LAMINECTOMY;  Surgeon: Jessy Oto, MD;  Location: [place];  Service: Orthopedics;  Laterality: Right;  Right L4-5 and L5-S1 Microdiscectomy  . Colonosocpy    . Esophagogastroduodenoscopy      ED  . Lumbar laminectomy  06/17/2012    Procedure: MICRODISCECTOMY LUMBAR LAMINECTOMY;  Surgeon: Jessy Oto, MD;  Location: [place];  Service: Orthopedics;  Laterality: N/A;  Right L5-S1 microdiscectomy  . Robotic assited partial nephrectomy Right 09/08/2015    Procedure: XI ROBOTIC ASSITED RIGHT PARTIAL NEPHRECTOMY;  Surgeon: [name], MD;  Location: [facility];  Service: Urology;  Laterality: Right;  Clamp on: 1033 Clamp off: 1103 Total Clamp Time: 30 minutes   Family History  Problem Relation Age of Onset  . Cancer Sister 30    Cancer  . Hypertension Other   . Colon cancer Neg Hx   . Esophageal cancer Neg Hx   .  Stomach cancer Neg Hx   . Diabetes Neg Hx    Social History  Substance Use Topics  . Smoking status: Never Smoker   . Smokeless tobacco: Never Used  . Alcohol Use: 0.0 oz/week    0 Standard drinks or equivalent per week     Comment: occasional    Review of Systems  Neurological: Positive for weakness.  All other systems reviewed and are negative.     Allergies  Review of patient's allergies indicates no known allergies.  Home Medications   Prior to Admission medications   Medication Sig Start Date End Date Taking? Authorizing Provider  aspirin EC 81 MG tablet Take 81 mg by mouth daily.   Yes Historical Provider, MD  atorvastatin (LIPITOR) 20 MG tablet Take 1 tablet (20 mg total) by mouth  daily. Patient taking differently: Take 20 mg by mouth at bedtime.  12/10/13  Yes [name], MD  BEE POLLEN PO Take 1 capsule by mouth daily.   Yes Historical Provider, MD  clonazePAM (KLONOPIN) 2 MG tablet Take 2 mg by mouth daily. Reported on 08/06/2015   Yes Historical Provider, MD  clotrimazole-betamethasone (LOTRISONE) cream Apply 1 application topically 2 (two) times daily.   Yes Historical Provider, MD  diltiazem (DILACOR XR) 120 MG 24 hr capsule Take 120 mg by mouth daily.     Yes Historical Provider, MD  gabapentin (NEURONTIN) 300 MG capsule Take 300 mg by mouth at bedtime. 08/23/15  Yes Historical Provider, MD  gemfibrozil (LOPID) 600 MG tablet Take 600 mg by mouth 2 (two) times daily.    Yes Historical Provider, MD  HYDROcodone-acetaminophen (NORCO) 5-325 MG tablet Take 1-2 tablets by mouth every 6 (six) hours as needed for moderate pain. 09/08/15  Yes Debbrah Alar, PA-C  Insulin Glargine (LANTUS SOLOSTAR) 100 UNIT/ML Solostar Pen Inject 15 Units into the skin every morning. Patient taking differently: Inject 15 Units into the skin daily at 10 pm.  06/14/15  Yes [name], MD  lisinopril (PRINIVIL,ZESTRIL) 40 MG tablet Take 20 mg by mouth daily.    Yes Historical Provider, MD  methocarbamol (ROBAXIN) 500 MG tablet Take 500 mg by mouth every 6 (six) hours as needed for muscle spasms.   Yes Historical Provider, MD  Multiple Vitamin (MULTIVITAMIN WITH MINERALS) TABS tablet Take 1 tablet by mouth daily.   Yes Historical Provider, MD  Omega-3 Fatty Acids (FISH OIL) 500 MG CAPS Take 500 mg by mouth daily.   Yes Historical Provider, MD  omeprazole (PRILOSEC) 40 MG capsule TAKE ONE CAPSULE EVERY DAY 02/24/13  Yes Neena Rhymes, MD  pioglitazone (ACTOS) 15 MG tablet Take 15 mg by mouth daily.   Yes Historical Provider, MD  sertraline (ZOLOFT) 100 MG tablet Take 100 tablets by mouth daily. Reported on 08/06/2015 11/23/12  Yes Historical Provider, MD  SitaGLIPtin-MetFORMIN HCl (JANUMET XR)  50-1000 MG TB24 Take 1 tablet by mouth daily. 10/13/14  Yes [name], MD  tamsulosin (FLOMAX) 0.4 MG CAPS capsule TAKE 1 CAPSULE (0.4 MG TOTAL) BY MOUTH DAILY. Patient taking differently: TAKE 1 CAPSULE (0.4 MG TOTAL) BY MOUTH AT BEDTIME. 01/20/14  Yes [name], MD  zolpidem (AMBIEN) 10 MG tablet Take 10 mg by mouth at bedtime as needed for sleep.  07/07/12  Yes Historical Provider, MD  gabapentin (NEURONTIN) 100 MG capsule Take 1 capsule (100 mg total) by mouth at bedtime. Patient not taking: Reported on 09/15/2015 06/09/13   Neena Rhymes, MD  glucose blood (ONE  TOUCH ULTRA TEST) test strip 1 each by Other route 2 (two) times daily. And lancets 2/day 250.03 10/16/14   [name], MD  Insulin Pen Needle (NOVOFINE PLUS) 32G X 4 MM MISC Use daily with insulin 06/14/15   [name], MD  Needles & Syringes MISC Use syringe and needle to inject insulin into the skin as directed on insulin prescription. DX E11.09 11/04/14   [name], MD   BP 126/87 mmHg  Pulse 78  Temp(Src) 98.7 F (37.1 C) (Oral)  Resp 20  SpO2 98% Physical Exam  Constitutional: He is oriented to person, place, and time. He appears well-developed and well-nourished.  Non-toxic appearance. No distress.  HENT:  Head: Normocephalic and atraumatic.  Eyes: Conjunctivae, EOM and lids are normal. Pupils are equal, round, and reactive to light.  Pale conjunctiva noted  Neck: Normal range of motion. Neck supple. No tracheal deviation present. No thyroid mass present.  Cardiovascular: Normal rate, regular rhythm and normal heart sounds.  Exam reveals no gallop.   No murmur heard. Pulmonary/Chest: Effort normal and breath sounds normal. No stridor. No respiratory distress. He has no decreased breath sounds. He has no wheezes. He has no rhonchi. He has no rales.  Abdominal: Soft. Normal appearance and bowel sounds are normal. He exhibits no distension. There is no tenderness. There is no rebound and no CVA tenderness.   Musculoskeletal: Normal range of motion. He exhibits no edema or tenderness.  Neurological: He is alert and oriented to person, place, and time. He has normal strength. No cranial nerve deficit or sensory deficit. GCS eye subscore is 4. GCS verbal subscore is 5. GCS motor subscore is 6.  Skin: Skin is warm and dry. No abrasion and no rash noted.  Psychiatric: He has a normal mood and affect. His speech is normal and behavior is normal.  Nursing note and vitals reviewed.   ED Course  Procedures (including critical care time) Labs Review Labs Reviewed  URINE CULTURE  CBC WITH DIFFERENTIAL/PLATELET  COMPREHENSIVE METABOLIC PANEL  URINALYSIS, ROUTINE W REFLEX MICROSCOPIC (NOT AT [facility])  TROPONIN I  I-STAT CG4 LACTIC ACID, ED    Imaging Review No results found. I have personally reviewed and evaluated these images and lab results as part of my medical decision-making.   EKG Interpretation None      MDM   Final diagnoses:  SOB (shortness of breath)    Patient's hemoglobin is Slightly improved from when he was discharged from the hospital (4/27) after he had his renal surgery. His creatinine has improved as well. He was discharged approximately week ago. He states that his dyspnea has been unchanged since he left the hospital. Will discharge home return precautions given    Lacretia Leigh, MD 09/15/15 1724  Lacretia Leigh, MD 09/15/15 [phone]

## 2015-09-17 LAB — URINE CULTURE

## 2015-09-21 DIAGNOSIS — Z0000 Encounter for general adult medical examination without abnormal findings: Secondary | ICD-10-CM | POA: Diagnosis not present

## 2015-09-23 ENCOUNTER — Emergency Department (HOSPITAL_BASED_OUTPATIENT_CLINIC_OR_DEPARTMENT_OTHER)
Admission: EM | Admit: 2015-09-23 | Discharge: 2015-09-24 | Disposition: A | Discharge status: Home / Self Care | Payer: Medicare Other | Attending: Emergency Medicine | Admitting: Emergency Medicine

## 2015-09-23 ENCOUNTER — Encounter (HOSPITAL_BASED_OUTPATIENT_CLINIC_OR_DEPARTMENT_OTHER): Payer: Self-pay

## 2015-09-23 DIAGNOSIS — E119 Type 2 diabetes mellitus without complications: Secondary | ICD-10-CM | POA: Diagnosis not present

## 2015-09-23 DIAGNOSIS — M1990 Unspecified osteoarthritis, unspecified site: Secondary | ICD-10-CM | POA: Diagnosis not present

## 2015-09-23 DIAGNOSIS — I10 Essential (primary) hypertension: Secondary | ICD-10-CM | POA: Diagnosis not present

## 2015-09-23 DIAGNOSIS — E785 Hyperlipidemia, unspecified: Secondary | ICD-10-CM | POA: Diagnosis not present

## 2015-09-23 DIAGNOSIS — T783XXA Angioneurotic edema, initial encounter: Secondary | ICD-10-CM | POA: Diagnosis not present

## 2015-09-23 DIAGNOSIS — Z8546 Personal history of malignant neoplasm of prostate: Secondary | ICD-10-CM | POA: Diagnosis not present

## 2015-09-23 DIAGNOSIS — Z79899 Other long term (current) drug therapy: Secondary | ICD-10-CM | POA: Diagnosis not present

## 2015-09-23 DIAGNOSIS — T464X5A Adverse effect of angiotensin-converting-enzyme inhibitors, initial encounter: Secondary | ICD-10-CM

## 2015-09-23 DIAGNOSIS — F329 Major depressive disorder, single episode, unspecified: Secondary | ICD-10-CM | POA: Insufficient documentation

## 2015-09-23 LAB — BASIC METABOLIC PANEL
ANION GAP: 5 (ref 5–15)
BUN: 26 mg/dL — ABNORMAL HIGH (ref 6–20)
CALCIUM: 9.9 mg/dL (ref 8.9–10.3)
CO2: 25 mmol/L (ref 22–32)
CREATININE: 1.81 mg/dL — AB (ref 0.61–1.24)
Chloride: 105 mmol/L (ref 101–111)
GFR calc non Af Amer: 36 mL/min — ABNORMAL LOW (ref >60)
GFR, EST AFRICAN AMERICAN: 42 mL/min — AB (ref >60)
Glucose, Bld: 76 mg/dL (ref 65–99)
Potassium: 4.7 mmol/L (ref 3.5–5.1)
Sodium: 135 mmol/L (ref 135–145)

## 2015-09-23 LAB — CBC WITH DIFFERENTIAL/PLATELET
BASOS ABS: 0 10*3/uL (ref 0.0–0.1)
BASOS PCT: 1 %
EOS PCT: 3 %
Eosinophils Absolute: 0.2 10*3/uL (ref 0.0–0.7)
HCT: 24.7 % — ABNORMAL LOW (ref 39.0–52.0)
Hemoglobin: 8.3 g/dL — ABNORMAL LOW (ref 13.0–17.0)
Lymphocytes Relative: 21 %
Lymphs Abs: 1.4 10*3/uL (ref 0.7–4.0)
MCH: 28.4 pg (ref 26.0–34.0)
MCHC: 33.6 g/dL (ref 30.0–36.0)
MCV: 84.6 fL (ref 78.0–100.0)
MONO ABS: 0.8 10*3/uL (ref 0.1–1.0)
Monocytes Relative: 13 %
NEUTROS ABS: 4.2 10*3/uL (ref 1.7–7.7)
Neutrophils Relative %: 62 %
PLATELETS: 524 10*3/uL — AB (ref 150–400)
RBC: 2.92 MIL/uL — ABNORMAL LOW (ref 4.22–5.81)
RDW: 14.1 % (ref 11.5–15.5)
WBC: 6.6 10*3/uL (ref 4.0–10.5)

## 2015-09-23 MED ORDER — DIPHENHYDRAMINE HCL 50 MG/ML IJ SOLN
50.0000 mg | Freq: Once | INTRAMUSCULAR | Status: AC
  Administered 2015-09-23: 50 mg via INTRAVENOUS
  Filled 2015-09-23: qty 1

## 2015-09-23 MED ORDER — FAMOTIDINE IN NACL 20-0.9 MG/50ML-% IV SOLN
20.0000 mg | Freq: Once | INTRAVENOUS | Status: AC
  Administered 2015-09-23: 20 mg via INTRAVENOUS
  Filled 2015-09-23: qty 50

## 2015-09-23 MED ORDER — DEXAMETHASONE SODIUM PHOSPHATE 10 MG/ML IJ SOLN
10.0000 mg | Freq: Once | INTRAMUSCULAR | Status: AC
  Administered 2015-09-23: 10 mg via INTRAVENOUS
  Filled 2015-09-23: qty 1

## 2015-09-23 NOTE — ED Notes (Signed)
Woke this am w left facial swelling around jaw and bottom lip  Through out day swelling has increased w swelling to both lips,  Took 25 mg benadryl,  No diff breathing or swallowing

## 2015-09-23 NOTE — ED Notes (Signed)
Pt woke with swelling to jaw this am-progression of swelling to lips-no resp distress-took benadryl 25mg  at 6pm-only new med is a supplement black seed oil yesterday-NAD-steady gait to tx area

## 2015-09-23 NOTE — ED Provider Notes (Signed)
CSN: [id number]     Arrival date & time 09/23/15  2234 History   By signing my name below, I, Antonio Adams, attest that this documentation has been prepared under the direction and in the presence of Shanon Rosser, MD . Electronically Signed: Rowan Adams, Scribe. 09/23/2015. 11:10 PM.   Chief Complaint  Patient presents with  . Angioedema   The history is provided by the patient. No language interpreter was used.   HPI Comments:  Antonio Adams is a 70 y.o. male with PMHx of HLD, HTN, DM and adenocarcinoma of prostate who presents to the Emergency Department complaining of gradual onset swelling lips since this morning. Swelling originated in lower lip and worsened throughout the day. Symptoms are moderate and there is no associated pain. Pt took 25 mg Benadryl 5 hours ago without relief. Denies tongue swelling, throat swelling or difficulty breathing.   Past Medical History  Diagnosis Date  . HYPERLIPIDEMIA 03/16/2007    takes Crestor daily  . CARPAL TUNNEL SYNDROME, BILATERAL 03/16/2007  . ALLERGIC RHINITIS 03/16/2007  . BACK PAIN WITH RADICULOPATHY 11/23/2008  . LEG CRAMPS 05/22/2007  . PARESTHESIA 05/21/2007  . GASTROINTESTINAL HEMORRHAGE, HX OF 03/16/2007  . Overactive bladder   . Hemorrhoids   . HYPERTENSION 03/16/2007    takes Diltiazem and Lisinopril daily   . Sleep apnea   . Chronic back pain   . Arthritis     back   . GERD 03/16/2007    takes Omeprazole daily  . Rectal bleeding     Dr.Norins has explained its from the Radiation that he has received  . History of colon polyps   . Urinary frequency     takes Toviaz daily  . Nocturia   . DIABETES MELLITUS, TYPE II 03/16/2007    takes Januvia and MEtformin daily  . Constipation     takes Senokot daily  . Glaucoma     mild - no eye drops  . Depression     occasionally  . Proctitis   . ADENOCARCINOMA, PROSTATE, GLEASON GRADE 6 12/21/2009   Past Surgical History  Procedure Laterality Date  . Back surgery  1983,  2008    repeat surgery; ESI '08  . Shoulder surgery  LFT  . Carpal tunnel release      bilateral  . Stress cardiolite  12/24/2003    negative for ischemia  . Wound exploration      management of a wound that he sustained in the [employer]  . Prostate surgery  march 2012    seed implant  . Cervical fusion    . Lumbar laminectomy  10/30/2011    Procedure: MICRODISCECTOMY LUMBAR LAMINECTOMY;  Surgeon: Jessy Oto, MD;  Location: [place];  Service: Orthopedics;  Laterality: Right;  Right L4-5 and L5-S1 Microdiscectomy  . Colonosocpy    . Esophagogastroduodenoscopy      ED  . Lumbar laminectomy  06/17/2012    Procedure: MICRODISCECTOMY LUMBAR LAMINECTOMY;  Surgeon: Jessy Oto, MD;  Location: [place];  Service: Orthopedics;  Laterality: N/A;  Right L5-S1 microdiscectomy  . Robotic assited partial nephrectomy Right 09/08/2015    Procedure: XI ROBOTIC ASSITED RIGHT PARTIAL NEPHRECTOMY;  Surgeon: [name], MD;  Location: [facility];  Service: Urology;  Laterality: Right;  Clamp on: 1033 Clamp off: 1103 Total Clamp Time: 30 minutes   Family History  Problem Relation Age of Onset  . Cancer Sister 30    Cancer  . Hypertension Other   . Colon cancer  Neg Hx   . Esophageal cancer Neg Hx   . Stomach cancer Neg Hx   . Diabetes Neg Hx    Social History  Substance Use Topics  . Smoking status: Never Smoker   . Smokeless tobacco: Never Used  . Alcohol Use: 0.0 oz/week    0 Standard drinks or equivalent per week     Comment: occasional    Review of Systems  10 systems reviewed and all are negative for acute change except as noted in the HPI.   Allergies  Review of patient's allergies indicates no known allergies.  Home Medications   Prior to Admission medications   Medication Sig Start Date End Date Taking? Authorizing Provider  aspirin EC 81 MG tablet Take 81 mg by mouth daily.    Historical Provider, MD  atorvastatin (LIPITOR) 20 MG tablet Take 1 tablet (20 mg total) by mouth  daily. Patient taking differently: Take 20 mg by mouth at bedtime.  12/10/13   [name], MD  BEE POLLEN PO Take 1 capsule by mouth daily.    Historical Provider, MD  clonazePAM (KLONOPIN) 2 MG tablet Take 2 mg by mouth daily. Reported on 08/06/2015    Historical Provider, MD  clotrimazole-betamethasone (LOTRISONE) cream Apply 1 application topically 2 (two) times daily.    Historical Provider, MD  diltiazem (DILACOR XR) 120 MG 24 hr capsule Take 120 mg by mouth daily.      Historical Provider, MD  gabapentin (NEURONTIN) 100 MG capsule Take 1 capsule (100 mg total) by mouth at bedtime. Patient not taking: Reported on 09/15/2015 06/09/13   Neena Rhymes, MD  gabapentin (NEURONTIN) 300 MG capsule Take 300 mg by mouth at bedtime. 08/23/15   Historical Provider, MD  gemfibrozil (LOPID) 600 MG tablet Take 600 mg by mouth 2 (two) times daily.     Historical Provider, MD  glucose blood (ONE TOUCH ULTRA TEST) test strip 1 each by Other route 2 (two) times daily. And lancets 2/day 250.03 10/16/14   [name], MD  HYDROcodone-acetaminophen (NORCO) 5-325 MG tablet Take 1-2 tablets by mouth every 6 (six) hours as needed for moderate pain. 09/08/15   Debbrah Alar, PA-C  Insulin Glargine (LANTUS SOLOSTAR) 100 UNIT/ML Solostar Pen Inject 15 Units into the skin every morning. Patient taking differently: Inject 15 Units into the skin daily at 10 pm.  06/14/15   [name], MD  Insulin Pen Needle (NOVOFINE PLUS) 32G X 4 MM MISC Use daily with insulin 06/14/15   [name], MD  lisinopril (PRINIVIL,ZESTRIL) 40 MG tablet Take 20 mg by mouth daily.     Historical Provider, MD  methocarbamol (ROBAXIN) 500 MG tablet Take 500 mg by mouth every 6 (six) hours as needed for muscle spasms.    Historical Provider, MD  Multiple Vitamin (MULTIVITAMIN WITH MINERALS) TABS tablet Take 1 tablet by mouth daily.    Historical Provider, MD  Needles & Syringes MISC Use syringe and needle to inject insulin into the skin as  directed on insulin prescription. DX E11.09 11/04/14   [name], MD  Omega-3 Fatty Acids (FISH OIL) 500 MG CAPS Take 500 mg by mouth daily.    Historical Provider, MD  omeprazole (PRILOSEC) 40 MG capsule TAKE ONE CAPSULE EVERY DAY 02/24/13   Neena Rhymes, MD  pioglitazone (ACTOS) 15 MG tablet Take 15 mg by mouth daily.    Historical Provider, MD  sertraline (ZOLOFT) 100 MG tablet Take 100 tablets by mouth daily.  Reported on 08/06/2015 11/23/12   Historical Provider, MD  SitaGLIPtin-MetFORMIN HCl (JANUMET XR) 50-1000 MG TB24 Take 1 tablet by mouth daily. 10/13/14   [name], MD  tamsulosin (FLOMAX) 0.4 MG CAPS capsule TAKE 1 CAPSULE (0.4 MG TOTAL) BY MOUTH DAILY. Patient taking differently: TAKE 1 CAPSULE (0.4 MG TOTAL) BY MOUTH AT BEDTIME. 01/20/14   [name], MD  zolpidem (AMBIEN) 10 MG tablet Take 10 mg by mouth at bedtime as needed for sleep.  07/07/12   Historical Provider, MD   BP 143/85 mmHg  Pulse 80  Temp(Src) 99.1 F (37.3 C) (Oral)  Resp 24  Ht 5\' 11"  (1.803 m)  Wt 222 lb (100.699 kg)  BMI 30.98 kg/m2  SpO2 98%   Physical Exam General: Well-developed, well-nourished male in no acute distress; appearance consistent with age of record HENT: normocephalic; atraumatic; edema of lips without erythema; no edema of tongue or pharynx; no dysphonia; no stridor Eyes: pupils equal, round and reactive to light; extraocular muscles intact; arcus senilis bilaterally Neck: supple Heart: regular rate and rhythm Lungs: clear to auscultation bilaterally Abdomen: soft; nondistended; nontender; bowel sounds present Extremities: No deformity; full range of motion; pulses normal Neurologic: Awake, alert and oriented; motor function intact in all extremities and symmetric; no facial droop Skin: Warm and dry Psychiatric: Normal mood and affect  ED Course  Procedures  DIAGNOSTIC STUDIES:  Oxygen Saturation is 98% on RA, normal by my interpretation.    COORDINATION OF  CARE:  11:08 PM Advised patient to stop taking ACE inhibitor. Discussed treatment plan with pt at bedside and pt agreed to plan.   MDM   Nursing notes and vitals signs, including pulse oximetry, reviewed.  Summary of this visit's results, reviewed by myself:  Labs:  Results for orders placed or performed during the hospital encounter of 09/23/15 (from the past 24 hour(s))  CBC with Differential/Platelet     Status: Abnormal   Collection Time: 09/23/15 10:50 PM  Result Value Ref Range   WBC 6.6 4.0 - 10.5 K/uL   RBC 2.92 (L) 4.22 - 5.81 MIL/uL   Hemoglobin 8.3 (L) 13.0 - 17.0 g/dL   HCT 24.7 (L) 39.0 - 52.0 %   MCV 84.6 78.0 - 100.0 fL   MCH 28.4 26.0 - 34.0 pg   MCHC 33.6 30.0 - 36.0 g/dL   RDW 14.1 11.5 - 15.5 %   Platelets 524 (H) 150 - 400 K/uL   Neutrophils Relative % 62 %   Neutro Abs 4.2 1.7 - 7.7 K/uL   Lymphocytes Relative 21 %   Lymphs Abs 1.4 0.7 - 4.0 K/uL   Monocytes Relative 13 %   Monocytes Absolute 0.8 0.1 - 1.0 K/uL   Eosinophils Relative 3 %   Eosinophils Absolute 0.2 0.0 - 0.7 K/uL   Basophils Relative 1 %   Basophils Absolute 0.0 0.0 - 0.1 K/uL  Basic metabolic panel     Status: Abnormal   Collection Time: 09/23/15 10:50 PM  Result Value Ref Range   Sodium 135 135 - 145 mmol/L   Potassium 4.7 3.5 - 5.1 mmol/L   Chloride 105 101 - 111 mmol/L   CO2 25 22 - 32 mmol/L   Glucose, Bld 76 65 - 99 mg/dL   BUN 26 (H) 6 - 20 mg/dL   Creatinine, Ser 1.81 (H) 0.61 - 1.24 mg/dL   Calcium 9.9 8.9 - 10.3 mg/dL   GFR calc non Af Amer 36 (L) >60 mL/min   GFR calc  Af Amer 42 (L) >60 mL/min   Anion gap 5 5 - 15   1:28 AM The patient's angioedema has not changed significantly despite IV Benadryl, Pepcid and dexamethasone. He is having no respiratory difficulty and there is no involvement of the tongue or pharynx. Will have him discontinue his lisinopril and contact his primary care physician later today for further evaluation.  Final diagnoses:  None   I  personally performed the services described in this documentation, which was scribed in my presence. The recorded information has been reviewed and is accurate.      Shanon Rosser, MD 09/24/15 [phone]

## 2015-09-24 NOTE — ED Notes (Signed)
No increased swelling noted

## 2015-09-27 DIAGNOSIS — M545 Low back pain: Secondary | ICD-10-CM | POA: Diagnosis not present

## 2015-09-28 ENCOUNTER — Encounter: Payer: Medicare Other | Admitting: Neurology

## 2015-10-06 ENCOUNTER — Ambulatory Visit (INDEPENDENT_AMBULATORY_CARE_PROVIDER_SITE_OTHER): Payer: Medicare Other | Admitting: Internal Medicine

## 2015-10-06 ENCOUNTER — Encounter: Payer: Self-pay | Admitting: Internal Medicine

## 2015-10-06 ENCOUNTER — Other Ambulatory Visit (INDEPENDENT_AMBULATORY_CARE_PROVIDER_SITE_OTHER): Payer: Medicare Other

## 2015-10-06 VITALS — BP 148/88 | HR 67 | Temp 98.1°F | Resp 16 | Ht 71.0 in | Wt 221.0 lb

## 2015-10-06 DIAGNOSIS — C641 Malignant neoplasm of right kidney, except renal pelvis: Secondary | ICD-10-CM

## 2015-10-06 DIAGNOSIS — D510 Vitamin B12 deficiency anemia due to intrinsic factor deficiency: Secondary | ICD-10-CM

## 2015-10-06 DIAGNOSIS — E118 Type 2 diabetes mellitus with unspecified complications: Secondary | ICD-10-CM

## 2015-10-06 DIAGNOSIS — I10 Essential (primary) hypertension: Secondary | ICD-10-CM

## 2015-10-06 DIAGNOSIS — C649 Malignant neoplasm of unspecified kidney, except renal pelvis: Secondary | ICD-10-CM | POA: Insufficient documentation

## 2015-10-06 DIAGNOSIS — Z794 Long term (current) use of insulin: Secondary | ICD-10-CM

## 2015-10-06 LAB — URINALYSIS, ROUTINE W REFLEX MICROSCOPIC
Bilirubin Urine: NEGATIVE
KETONES UR: NEGATIVE
Leukocytes, UA: NEGATIVE
Nitrite: NEGATIVE
PH: 6 (ref 5.0–8.0)
SPECIFIC GRAVITY, URINE: 1.015 (ref 1.000–1.030)
URINE GLUCOSE: NEGATIVE
UROBILINOGEN UA: 0.2 (ref 0.0–1.0)
WBC, UA: NONE SEEN (ref 0–?)

## 2015-10-06 LAB — CBC WITH DIFFERENTIAL/PLATELET
BASOS ABS: 0 10*3/uL (ref 0.0–0.1)
BASOS PCT: 0.7 % (ref 0.0–3.0)
EOS ABS: 0.2 10*3/uL (ref 0.0–0.7)
Eosinophils Relative: 4.9 % (ref 0.0–5.0)
HEMATOCRIT: 27.1 % — AB (ref 39.0–52.0)
HEMOGLOBIN: 9 g/dL — AB (ref 13.0–17.0)
LYMPHS PCT: 19.6 % (ref 12.0–46.0)
Lymphs Abs: 1 10*3/uL (ref 0.7–4.0)
MCHC: 33.1 g/dL (ref 30.0–36.0)
MCV: 84.3 fl (ref 78.0–100.0)
MONO ABS: 0.6 10*3/uL (ref 0.1–1.0)
Monocytes Relative: 13.1 % — ABNORMAL HIGH (ref 3.0–12.0)
Neutro Abs: 3 10*3/uL (ref 1.4–7.7)
Neutrophils Relative %: 61.7 % (ref 43.0–77.0)
Platelets: 299 10*3/uL (ref 150.0–400.0)
RBC: 3.21 Mil/uL — AB (ref 4.22–5.81)
RDW: 17.1 % — ABNORMAL HIGH (ref 11.5–15.5)
WBC: 4.9 10*3/uL (ref 4.0–10.5)

## 2015-10-06 LAB — COMPREHENSIVE METABOLIC PANEL
ALBUMIN: 4.2 g/dL (ref 3.5–5.2)
ALK PHOS: 100 U/L (ref 39–117)
ALT: 11 U/L (ref 0–53)
AST: 14 U/L (ref 0–37)
BUN: 16 mg/dL (ref 6–23)
CALCIUM: 10.2 mg/dL (ref 8.4–10.5)
CO2: 25 mEq/L (ref 19–32)
Chloride: 104 mEq/L (ref 96–112)
Creatinine, Ser: 1.38 mg/dL (ref 0.40–1.50)
GFR: 65.61 mL/min (ref >60.00)
Glucose, Bld: 102 mg/dL — ABNORMAL HIGH (ref 70–99)
POTASSIUM: 4.5 meq/L (ref 3.5–5.1)
SODIUM: 136 meq/L (ref 135–145)
TOTAL PROTEIN: 7.2 g/dL (ref 6.0–8.3)
Total Bilirubin: 0.3 mg/dL (ref 0.2–1.2)

## 2015-10-06 LAB — IBC PANEL
IRON: 56 ug/dL (ref 42–165)
SATURATION RATIOS: 14.2 % — AB (ref 20.0–50.0)
TRANSFERRIN: 282 mg/dL (ref 212.0–360.0)

## 2015-10-06 LAB — FERRITIN: FERRITIN: 112.9 ng/mL (ref 22.0–322.0)

## 2015-10-06 MED ORDER — HYDROCODONE-ACETAMINOPHEN 5-325 MG PO TABS: 1.0000 | ORAL_TABLET | Freq: Four times a day (QID) | ORAL | Status: DC | PRN

## 2015-10-06 MED ORDER — TELMISARTAN 40 MG PO TABS: 40.0000 mg | ORAL_TABLET | Freq: Every day | ORAL | Status: DC

## 2015-10-06 NOTE — Patient Instructions (Signed)
Hypertension Hypertension, commonly called high blood pressure, is when the force of blood pumping through your arteries is too strong. Your arteries are the blood vessels that carry blood from your heart throughout your body. A blood pressure reading consists of a higher number over a lower number, such as 110/72. The higher number (systolic) is the pressure inside your arteries when your heart pumps. The lower number (diastolic) is the pressure inside your arteries when your heart relaxes. Ideally you want your blood pressure below 120/80. Hypertension forces your heart to work harder to pump blood. Your arteries may become narrow or stiff. Having untreated or uncontrolled hypertension can cause heart attack, stroke, kidney disease, and other problems. RISK FACTORS Some risk factors for high blood pressure are controllable. Others are not.  Risk factors you cannot control include:   Race. You may be at higher risk if you are African American.  Age. Risk increases with age.  Gender. Men are at higher risk than women before age 45 years. After age 65, women are at higher risk than men. Risk factors you can control include:  Not getting enough exercise or physical activity.  Being overweight.  Getting too much fat, sugar, calories, or salt in your diet.  Drinking too much alcohol. SIGNS AND SYMPTOMS Hypertension does not usually cause signs or symptoms. Extremely high blood pressure (hypertensive crisis) may cause headache, anxiety, shortness of breath, and nosebleed. DIAGNOSIS To check if you have hypertension, your health care provider will measure your blood pressure while you are seated, with your arm held at the level of your heart. It should be measured at least twice using the same arm. Certain conditions can cause a difference in blood pressure between your right and left arms. A blood pressure reading that is higher than normal on one occasion does not mean that you need treatment. If  it is not clear whether you have high blood pressure, you may be asked to return on a different day to have your blood pressure checked again. Or, you may be asked to monitor your blood pressure at home for 1 or more weeks. TREATMENT Treating high blood pressure includes making lifestyle changes and possibly taking medicine. Living a healthy lifestyle can help lower high blood pressure. You may need to change some of your habits. Lifestyle changes may include:  Following the DASH diet. This diet is high in fruits, vegetables, and whole grains. It is low in salt, red meat, and added sugars.  Keep your sodium intake below 2,300 mg per day.  Getting at least 30-45 minutes of aerobic exercise at least 4 times per week.  Losing weight if necessary.  Not smoking.  Limiting alcoholic beverages.  Learning ways to reduce stress. Your health care provider may prescribe medicine if lifestyle changes are not enough to get your blood pressure under control, and if one of the following is true:  You are 18-59 years of age and your systolic blood pressure is above 140.  You are 60 years of age or older, and your systolic blood pressure is above 150.  Your diastolic blood pressure is above 90.  You have diabetes, and your systolic blood pressure is over 140 or your diastolic blood pressure is over 90.  You have kidney disease and your blood pressure is above 140/90.  You have heart disease and your blood pressure is above 140/90. Your personal target blood pressure may vary depending on your medical conditions, your age, and other factors. HOME CARE INSTRUCTIONS    Have your blood pressure rechecked as directed by your health care provider.   Take medicines only as directed by your health care provider. Follow the directions carefully. Blood pressure medicines must be taken as prescribed. The medicine does not work as well when you skip doses. Skipping doses also puts you at risk for  problems.  Do not smoke.   Monitor your blood pressure at home as directed by your health care provider. SEEK MEDICAL CARE IF:   You think you are having a reaction to medicines taken.  You have recurrent headaches or feel dizzy.  You have swelling in your ankles.  You have trouble with your vision. SEEK IMMEDIATE MEDICAL CARE IF:  You develop a severe headache or confusion.  You have unusual weakness, numbness, or feel faint.  You have severe chest or abdominal pain.  You vomit repeatedly.  You have trouble breathing. MAKE SURE YOU:   Understand these instructions.  Will watch your condition.  Will get help right away if you are not doing well or get worse.   This information is not intended to replace advice given to you by your health care provider. Make sure you discuss any questions you have with your health care provider.   Document Released: 05/01/2005 Document Revised: 09/15/2014 Document Reviewed: 02/21/2013 Elsevier Interactive Patient Education 2016 [employer]  

## 2015-10-06 NOTE — Progress Notes (Signed)
Subjective:  Patient ID: Antonio Adams, male    DOB: 1945-06-10  Age: 70 y.o. MRN: [id number]  CC: Anemia and Hypertension   HPI Antonio Adams presents for follow-up on anemia and hypertension, he is several weeks status post right partial nephrectomy that revealed RCC. He has 5 small incisions on his abdomen from the laparoscopic surgery and though he says those are all healing well with no evidence of infection he also says that there is some discomfort at the incision sites and he requests a refill on hydrocodone. After the surgery he had some episodes of shortness of breath and was seen in the emergency room but it was found that his anemia was actually improving. He tells me the shortness of breath has resolved and he denies headache, blurred vision, chest pain, shortness of breath, nausea, vomiting, loss of appetite, dysuria, heat hematuria, or near-syncope. Regarding his high blood pressure, he had been taking an ACE inhibitor and he developed an episode of angioedema was seen in the emergency room so the ACE inhibitor was stopped. Since then he tells me that his blood pressure has not been very well controlled.  Outpatient Prescriptions Prior to Visit  Medication Sig Dispense Refill  . aspirin EC 81 MG tablet Take 81 mg by mouth daily.    Marland Kitchen atorvastatin (LIPITOR) 20 MG tablet Take 1 tablet (20 mg total) by mouth daily. (Patient taking differently: Take 20 mg by mouth at bedtime. ) 90 tablet 3  . BEE POLLEN PO Take 1 capsule by mouth daily.    . clonazePAM (KLONOPIN) 2 MG tablet Take 2 mg by mouth daily. Reported on 08/06/2015    . clotrimazole-betamethasone (LOTRISONE) cream Apply 1 application topically 2 (two) times daily.    Marland Kitchen diltiazem (DILACOR XR) 120 MG 24 hr capsule Take 120 mg by mouth daily.      Marland Kitchen gabapentin (NEURONTIN) 300 MG capsule Take 300 mg by mouth at bedtime.    Marland Kitchen gemfibrozil (LOPID) 600 MG tablet Take 600 mg by mouth 2 (two) times daily.     Marland Kitchen glucose blood  (ONE TOUCH ULTRA TEST) test strip 1 each by Other route 2 (two) times daily. And lancets 2/day 250.03 100 each 12  . HYDROcodone-acetaminophen (NORCO) 5-325 MG tablet Take 1-2 tablets by mouth every 6 (six) hours as needed for moderate pain. 30 tablet 0  . Insulin Glargine (LANTUS SOLOSTAR) 100 UNIT/ML Solostar Pen Inject 15 Units into the skin every morning. (Patient taking differently: Inject 15 Units into the skin daily at 10 pm. ) 15 mL 3  . Insulin Pen Needle (NOVOFINE PLUS) 32G X 4 MM MISC Use daily with insulin 100 each 5  . methocarbamol (ROBAXIN) 500 MG tablet Take 500 mg by mouth every 6 (six) hours as needed for muscle spasms.    . Multiple Vitamin (MULTIVITAMIN WITH MINERALS) TABS tablet Take 1 tablet by mouth daily.    . Needles & Syringes MISC Use syringe and needle to inject insulin into the skin as directed on insulin prescription. DX E11.09 90 each 3  . Omega-3 Fatty Acids (FISH OIL) 500 MG CAPS Take 500 mg by mouth daily.    Marland Kitchen omeprazole (PRILOSEC) 40 MG capsule TAKE ONE CAPSULE EVERY DAY 30 capsule 10  . pioglitazone (ACTOS) 15 MG tablet Take 15 mg by mouth daily.    . sertraline (ZOLOFT) 100 MG tablet Take 100 tablets by mouth daily. Reported on 08/06/2015    . SitaGLIPtin-MetFORMIN HCl (JANUMET XR) 50-1000  MG TB24 Take 1 tablet by mouth daily. 30 tablet 11  . tamsulosin (FLOMAX) 0.4 MG CAPS capsule TAKE 1 CAPSULE (0.4 MG TOTAL) BY MOUTH DAILY. (Patient taking differently: TAKE 1 CAPSULE (0.4 MG TOTAL) BY MOUTH AT BEDTIME.) 30 capsule 1  . zolpidem (AMBIEN) 10 MG tablet Take 10 mg by mouth at bedtime as needed for sleep.      No facility-administered medications prior to visit.    ROS Review of Systems  Constitutional: Negative.  Negative for fever, chills, diaphoresis, appetite change and fatigue.  HENT: Negative.   Eyes: Negative.   Respiratory: Negative.  Negative for cough, choking, chest tightness, shortness of breath and stridor.   Cardiovascular: Negative.  Negative  for chest pain, palpitations and leg swelling.  Gastrointestinal: Negative.  Negative for nausea, vomiting, abdominal pain, diarrhea, constipation and blood in stool.  Endocrine: Negative.   Genitourinary: Negative.  Negative for dysuria, urgency, hematuria, flank pain, penile swelling and difficulty urinating.  Musculoskeletal: Negative.  Negative for myalgias, back pain, joint swelling and arthralgias.  Skin: Negative.  Negative for color change and rash.  Allergic/Immunologic: Negative.   Neurological: Negative.  Negative for dizziness, tremors, weakness, light-headedness and numbness.  Hematological: Negative.  Negative for adenopathy. Does not bruise/bleed easily.  Psychiatric/Behavioral: Negative.     Objective:  BP 148/88 mmHg  Pulse 67  Temp(Src) 98.1 F (36.7 C) (Oral)  Resp 16  Ht 5\' 11"  (1.803 m)  Wt 221 lb (100.245 kg)  BMI 30.84 kg/m2  SpO2 97%  BP Readings from Last 3 Encounters:  10/06/15 148/88  09/24/15 129/79  09/15/15 139/80    Wt Readings from Last 3 Encounters:  10/06/15 221 lb (100.245 kg)  09/23/15 222 lb (100.699 kg)  09/08/15 231 lb (104.781 kg)    Physical Exam  Constitutional: He is oriented to person, place, and time.  Non-toxic appearance. He does not have a sickly appearance. He does not appear ill. No distress.  HENT:  Mouth/Throat: Oropharynx is clear and moist. No oropharyngeal exudate.  Eyes: Conjunctivae are normal. Right eye exhibits no discharge. Left eye exhibits no discharge. No scleral icterus.  Neck: Normal range of motion. Neck supple. No JVD present. No tracheal deviation present. No thyromegaly present.  Cardiovascular: Normal rate, regular rhythm, normal heart sounds and intact distal pulses.  Exam reveals no gallop and no friction rub.   No murmur heard. Pulmonary/Chest: Effort normal and breath sounds normal. No stridor. No respiratory distress. He has no wheezes. He has no rales. He exhibits no tenderness.  Abdominal: Soft.  Normal appearance and bowel sounds are normal. He exhibits no distension and no mass. There is no tenderness. There is no rebound and no guarding.    Musculoskeletal: Normal range of motion. He exhibits no edema or tenderness.  Lymphadenopathy:    He has no cervical adenopathy.  Neurological: He is oriented to person, place, and time.  Skin: Skin is warm and dry. No rash noted. He is not diaphoretic. No erythema. No pallor.  Vitals reviewed.   Lab Results  Component Value Date   WBC 6.6 09/23/2015   HGB 8.3* 09/23/2015   HCT 24.7* 09/23/2015   PLT 524* 09/23/2015   GLUCOSE 76 09/23/2015   CHOL 152 06/14/2015   TRIG 114.0 06/14/2015   HDL 28.00* 06/14/2015   LDLCALC 101* 06/14/2015   ALT 14* 09/15/2015   AST 14* 09/15/2015   NA 135 09/23/2015   K 4.7 09/23/2015   CL 105 09/23/2015   CREATININE 1.81*  09/23/2015   BUN 26* 09/23/2015   CO2 25 09/23/2015   TSH 0.67 06/14/2015   PSA 0.09* 12/10/2013   INR 1.02 07/14/2010   HGBA1C 7.8* 08/24/2015   MICROALBUR 3.3* 06/14/2015    No results found.  Assessment & Plan:   Chosen was seen today for anemia and hypertension.  Diagnoses and all orders for this visit:  Essential hypertension- his blood pressure is not adequately well controlled so Avastin to start taking an ARB -     telmisartan (MICARDIS) 40 MG tablet; Take 1 tablet (40 mg total) by mouth daily.  Pernicious anemia- improvement noted, his iron level is normal, this is most likely a postop anemia combined with some renal issues, for now will just follow the anemia without treatment. -     CBC with Differential/Platelet; Future -     IBC panel; Future -     Ferritin; Future  Renal cell carcinoma, right ([place])- we'll continue Norco as needed for pain at the incision site  Type 2 diabetes mellitus with complication, with long-term current use of insulin ([place])- he needs renal protection so will add an ARB, he will continue taking pioglitazone and insulin to control  his blood sugars. -     telmisartan (MICARDIS) 40 MG tablet; Take 1 tablet (40 mg total) by mouth daily.  I am having Mr. Pike start on telmisartan. I am also having him maintain his diltiazem, gemfibrozil, zolpidem, sertraline, omeprazole, atorvastatin, tamsulosin, clonazePAM, SitaGLIPtin-MetFORMIN HCl, glucose blood, Needles & Syringes, Insulin Glargine, Insulin Pen Needle, clotrimazole-betamethasone, methocarbamol, pioglitazone, HYDROcodone-acetaminophen, gabapentin, multivitamin with minerals, Fish Oil, aspirin EC, BEE POLLEN PO, and citalopram.  Meds ordered this encounter  Medications  . citalopram (CELEXA) 10 MG tablet    Sig:   . telmisartan (MICARDIS) 40 MG tablet    Sig: Take 1 tablet (40 mg total) by mouth daily.    Dispense:  90 tablet    Refill:  1     Follow-up: No Follow-up on file.  Scarlette Calico, MD

## 2015-10-06 NOTE — Progress Notes (Signed)
Pre visit review using our clinic review tool, if applicable. No additional management support is needed unless otherwise documented below in the visit note. 

## 2015-10-18 ENCOUNTER — Other Ambulatory Visit: Payer: Self-pay | Admitting: Internal Medicine

## 2015-10-26 LAB — HM DIABETES EYE EXAM

## 2015-11-01 ENCOUNTER — Encounter: Payer: Self-pay | Admitting: Internal Medicine

## 2015-11-12 ENCOUNTER — Other Ambulatory Visit: Payer: Self-pay | Admitting: Internal Medicine

## 2015-12-01 ENCOUNTER — Encounter: Payer: Self-pay | Admitting: Neurology

## 2015-12-01 ENCOUNTER — Ambulatory Visit (INDEPENDENT_AMBULATORY_CARE_PROVIDER_SITE_OTHER): Payer: Self-pay | Admitting: Neurology

## 2015-12-01 ENCOUNTER — Ambulatory Visit (INDEPENDENT_AMBULATORY_CARE_PROVIDER_SITE_OTHER): Payer: Medicare Other | Admitting: Neurology

## 2015-12-01 DIAGNOSIS — M5416 Radiculopathy, lumbar region: Secondary | ICD-10-CM

## 2015-12-01 DIAGNOSIS — R202 Paresthesia of skin: Secondary | ICD-10-CM

## 2015-12-01 DIAGNOSIS — M48061 Spinal stenosis, lumbar region without neurogenic claudication: Secondary | ICD-10-CM

## 2015-12-01 DIAGNOSIS — M4806 Spinal stenosis, lumbar region: Secondary | ICD-10-CM

## 2015-12-01 NOTE — Procedures (Signed)
     HISTORY:  Antonio Adams is a 70 year old gentleman with a history of diabetes. The patient has a one-year history of numbness and tingling sensations in the distal portions of both feet. He has a history of lumbosacral spine surgery on 4 occasions. He reports that the right foot is somewhat more involved than the left, he denies any weakness of the legs. The patient is being evaluated for a possible neuropathy or a lumbosacral radiculopathy.  NERVE CONDUCTION STUDIES:  Nerve conduction studies were performed on both lower extremities. The distal motor latencies and motor amplitudes for the peroneal and posterior tibial nerves were within normal limits. The nerve conduction velocities for these nerves were also normal. The H reflex latencies were normal. The sensory latencies for the peroneal nerves were within normal limits.   EMG STUDIES:  EMG study was performed on the right lower extremity:  The tibialis anterior muscle reveals 2 to 4K motor units with full recruitment. No fibrillations or positive waves were seen. The peroneus tertius muscle reveals 2 to 4K motor units with full recruitment. No fibrillations or positive waves were seen. The medial gastrocnemius muscle reveals 1 to 3K motor units with full recruitment. 1+ positive waves were seen. The vastus lateralis muscle reveals 2 to 4K motor units with full recruitment. No fibrillations or positive waves were seen. The iliopsoas muscle reveals 2 to 4K motor units with full recruitment. No fibrillations or positive waves were seen. The biceps femoris muscle (long head) reveals 2 to 4K motor units with full recruitment. No fibrillations or positive waves were seen. The lumbosacral paraspinal muscles were tested at 3 levels, and revealed no abnormalities of insertional activity at the middle and lower levels tested. 1+ positive waves were seen at the upper level. There was good relaxation.   The patient refused study of the left  lower extremity.   IMPRESSION:  Nerve conduction studies done on both lower extremities were within normal limits. No evidence of a peripheral neuropathy is seen. A small fiber neuropathy or an early peripheral neuropathy may be missed by standard nerve conduction studies, clinical correlation is required. EMG evaluation of the right lower extremity shows minimal findings that could be consistent with a very mild S1 radiculopathy. Once again, clinical correlation is required. The patient refused study of the left lower extremity.  Jill Alexanders MD 12/01/2015 4:18 PM  [facility] [address] [place], [place] [address]  Phone [phone] Fax [phone]

## 2015-12-01 NOTE — Progress Notes (Signed)
Please refer to EMG and nerve conduction study procedure note. 

## 2015-12-09 DIAGNOSIS — M545 Low back pain: Secondary | ICD-10-CM | POA: Diagnosis not present

## 2015-12-20 ENCOUNTER — Encounter: Payer: Self-pay | Admitting: Internal Medicine

## 2015-12-21 ENCOUNTER — Other Ambulatory Visit: Payer: Self-pay | Admitting: Orthopedic Surgery

## 2015-12-21 DIAGNOSIS — M25512 Pain in left shoulder: Secondary | ICD-10-CM | POA: Diagnosis not present

## 2015-12-23 ENCOUNTER — Ambulatory Visit
Admission: RE | Admit: 2015-12-23 | Discharge: 2015-12-23 | Disposition: A | Discharge status: Home / Self Care | Payer: Medicare Other | Source: Ambulatory Visit | Attending: Orthopedic Surgery | Admitting: Orthopedic Surgery

## 2015-12-23 DIAGNOSIS — M25512 Pain in left shoulder: Secondary | ICD-10-CM

## 2015-12-23 DIAGNOSIS — M75112 Incomplete rotator cuff tear or rupture of left shoulder, not specified as traumatic: Secondary | ICD-10-CM | POA: Diagnosis not present

## 2015-12-28 DIAGNOSIS — Z85528 Personal history of other malignant neoplasm of kidney: Secondary | ICD-10-CM | POA: Diagnosis not present

## 2015-12-28 DIAGNOSIS — N3281 Overactive bladder: Secondary | ICD-10-CM | POA: Diagnosis not present

## 2015-12-28 DIAGNOSIS — Z8546 Personal history of malignant neoplasm of prostate: Secondary | ICD-10-CM | POA: Diagnosis not present

## 2016-01-04 DIAGNOSIS — C641 Malignant neoplasm of right kidney, except renal pelvis: Secondary | ICD-10-CM | POA: Diagnosis not present

## 2016-01-07 ENCOUNTER — Ambulatory Visit (INDEPENDENT_AMBULATORY_CARE_PROVIDER_SITE_OTHER): Payer: Medicare Other | Admitting: Internal Medicine

## 2016-01-07 ENCOUNTER — Encounter: Payer: Self-pay | Admitting: Internal Medicine

## 2016-01-07 DIAGNOSIS — Z794 Long term (current) use of insulin: Secondary | ICD-10-CM

## 2016-01-07 DIAGNOSIS — E118 Type 2 diabetes mellitus with unspecified complications: Secondary | ICD-10-CM | POA: Diagnosis not present

## 2016-01-07 DIAGNOSIS — I10 Essential (primary) hypertension: Secondary | ICD-10-CM | POA: Diagnosis not present

## 2016-01-07 NOTE — Progress Notes (Signed)
Pre visit review using our clinic review tool, if applicable. No additional management support is needed unless otherwise documented below in the visit note. 

## 2016-01-07 NOTE — Assessment & Plan Note (Addendum)
It is unclear what he is taking but sounds like he was taking nothing for blood pressure until last night took diltiazem. Advised him to continue taking diltiazem 120 mg daily and nurse visit next week for efficacy and adjustment made then if needed. Visit with MD in 2-3 weeks for adjustment. Have advised him to stop taking telmisartan given the facial symptoms and prior angioedema. Would recommend no ACE-I or ARB in the future. Given his R partial nephrectomy needs tight BP control. Checking CMP with nurse visit next week.

## 2016-01-07 NOTE — Assessment & Plan Note (Signed)
He admits to not knowing what he is taking for his sugars. Advised to drink more fluids and avoid sugary foods. Possibly related to recent viral URI.

## 2016-01-07 NOTE — Patient Instructions (Signed)
Continue to take the diltiazem 120 mg daily for the blood pressure.   Come back on Wednesday or Thursday next week for a nurse visit for the blood pressure to see how it is doing. We may need to adjust the dose on the diltiazem.   Continue to STOP the telmisartan as it can do the same facial swelling as the other medicine you were on.

## 2016-01-07 NOTE — Progress Notes (Signed)
   Subjective:    Patient ID: Antonio Adams, male    DOB: November 11, 1945, 70 y.o.   MRN: [id number]  HPI The patient is a 70 YO man coming in for high blood pressure for several days. He is not able to tell which medicines he is taking but states all the bottles he has at home he is taking. Med reconciliation shows lipitor not filled since 2016, filling citalopram monthly, clonazepam not filled since Dec 2016, gabapentin not filled since April, lantus not filled since May, omeprazole not filled since 2015, sitagliptin/metformin filled recently but not consistently, tamsulosin not filled since 2015, telmisartan filled 3 months in May, trazodone last filled in 2015. No fill of diltiazem or gemfibrozil or methocarbamol or actos or zoloft. Recent R partial nephrectomy for RCC.  States took diltiazem last night (hadn't taken since May) sugar 227 this morning. He does not have the telmisartan right now (out for a couple months). He started getting a funny feeling in his face while taking it so stopped. Denies chest pain or SOB or nausea or confusion. He is having some vision change the last week along with some sinus infection. This is getting better now.    Review of Systems  Constitutional: Negative for activity change, appetite change, fatigue, fever and unexpected weight change.  HENT: Positive for congestion, postnasal drip and rhinorrhea. Negative for sinus pressure.   Eyes: Positive for visual disturbance.  Respiratory: Negative.   Cardiovascular: Negative.   Gastrointestinal: Negative.   Musculoskeletal: Negative.   Skin: Negative.   Neurological: Negative.       Objective:   Physical Exam  Constitutional: He is oriented to person, place, and time. He appears well-developed and well-nourished.  HENT:  Head: Normocephalic and atraumatic.  Eyes: EOM are normal. Pupils are equal, round, and reactive to light.  Neck: Normal range of motion. No JVD present.  Cardiovascular: Normal rate and  regular rhythm.   Pulmonary/Chest: Effort normal and breath sounds normal. No respiratory distress. He has no wheezes.  Abdominal: Soft. Bowel sounds are normal. He exhibits no distension. There is no tenderness. There is no rebound.  Neurological: He is alert and oriented to person, place, and time. No cranial nerve deficit. Coordination normal.  Skin: Skin is warm and dry.  Psychiatric: He has a normal mood and affect.   Vitals:   01/07/16 0929 01/07/16 1035  BP: (!) 160/80 (!) 152/60  Pulse: 76   Resp: 12   Temp: 98.3 F (36.8 C)   TempSrc: Oral   SpO2: 96%   Weight: 226 lb (102.5 kg)   Height: 5\' 11"  (1.803 m)       Assessment & Plan:  Visit time 25 minutes: greater than 50% of which was spent in face to face counseling and coordination of care with the patient: counseling on medication adherence and trying to sort through his medication list to ensure appropriate care.

## 2016-01-13 ENCOUNTER — Ambulatory Visit: Payer: Medicare Other | Admitting: Emergency Medicine

## 2016-01-13 VITALS — BP 140/84

## 2016-01-13 DIAGNOSIS — I10 Essential (primary) hypertension: Secondary | ICD-10-CM

## 2016-01-13 NOTE — Progress Notes (Signed)
Please advise if any changes need to be made to patient's medication after BP check.
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
## 2016-01-18 ENCOUNTER — Encounter (HOSPITAL_BASED_OUTPATIENT_CLINIC_OR_DEPARTMENT_OTHER)
Admission: RE | Admit: 2016-01-18 | Discharge: 2016-01-18 | Disposition: A | Discharge status: Home / Self Care | Payer: Medicare Other | Source: Ambulatory Visit | Attending: Orthopedic Surgery | Admitting: Orthopedic Surgery

## 2016-01-18 ENCOUNTER — Encounter (HOSPITAL_BASED_OUTPATIENT_CLINIC_OR_DEPARTMENT_OTHER): Payer: Self-pay | Admitting: *Deleted

## 2016-01-18 DIAGNOSIS — Z85528 Personal history of other malignant neoplasm of kidney: Secondary | ICD-10-CM | POA: Diagnosis not present

## 2016-01-18 DIAGNOSIS — M7542 Impingement syndrome of left shoulder: Secondary | ICD-10-CM | POA: Diagnosis not present

## 2016-01-18 DIAGNOSIS — M7552 Bursitis of left shoulder: Secondary | ICD-10-CM | POA: Diagnosis not present

## 2016-01-18 DIAGNOSIS — M4806 Spinal stenosis, lumbar region: Secondary | ICD-10-CM | POA: Diagnosis not present

## 2016-01-18 DIAGNOSIS — E119 Type 2 diabetes mellitus without complications: Secondary | ICD-10-CM | POA: Diagnosis not present

## 2016-01-18 DIAGNOSIS — I10 Essential (primary) hypertension: Secondary | ICD-10-CM | POA: Diagnosis not present

## 2016-01-18 DIAGNOSIS — K219 Gastro-esophageal reflux disease without esophagitis: Secondary | ICD-10-CM | POA: Diagnosis not present

## 2016-01-18 DIAGNOSIS — F418 Other specified anxiety disorders: Secondary | ICD-10-CM | POA: Diagnosis not present

## 2016-01-18 DIAGNOSIS — M1990 Unspecified osteoarthritis, unspecified site: Secondary | ICD-10-CM | POA: Diagnosis not present

## 2016-01-18 DIAGNOSIS — G4730 Sleep apnea, unspecified: Secondary | ICD-10-CM | POA: Diagnosis not present

## 2016-01-18 DIAGNOSIS — Z794 Long term (current) use of insulin: Secondary | ICD-10-CM | POA: Diagnosis not present

## 2016-01-18 DIAGNOSIS — M19012 Primary osteoarthritis, left shoulder: Secondary | ICD-10-CM | POA: Diagnosis not present

## 2016-01-18 DIAGNOSIS — G8929 Other chronic pain: Secondary | ICD-10-CM | POA: Diagnosis not present

## 2016-01-18 LAB — BASIC METABOLIC PANEL
Anion gap: 8 (ref 5–15)
BUN: 19 mg/dL (ref 6–20)
CO2: 23 mmol/L (ref 22–32)
CREATININE: 1.62 mg/dL — AB (ref 0.61–1.24)
Calcium: 10.1 mg/dL (ref 8.9–10.3)
Chloride: 104 mmol/L (ref 101–111)
GFR calc Af Amer: 48 mL/min — ABNORMAL LOW (ref >60)
GFR calc non Af Amer: 42 mL/min — ABNORMAL LOW (ref >60)
GLUCOSE: 241 mg/dL — AB (ref 65–99)
POTASSIUM: 4.8 mmol/L (ref 3.5–5.1)
SODIUM: 135 mmol/L (ref 135–145)

## 2016-01-18 NOTE — Progress Notes (Signed)
Abnormal lab values reviewed by Dr crews no further action needed

## 2016-01-19 NOTE — Anesthesia Preprocedure Evaluation (Addendum)
Anesthesia Evaluation  Patient identified by MRN, date of birth, ID band Patient awake    Reviewed: Allergy & Precautions, NPO status , Patient's Chart, lab work & pertinent test results  History of Anesthesia Complications (+) DIFFICULT AIRWAY and history of anesthetic complications  Airway Mallampati: III  TM Distance: >3 FB Neck ROM: Full   Comment: Previously intubated with glidescope 3 Dental  (+) Dental Advisory Given, Teeth Intact   Pulmonary sleep apnea and Continuous Positive Airway Pressure Ventilation , neg COPD, neg recent URI,    breath sounds clear to auscultation       Cardiovascular hypertension, Pt. on medications (-) angina(-) Past MI and (-) Cardiac Stents  Rhythm:Regular Rate:Normal     Neuro/Psych PSYCHIATRIC DISORDERS (PTSD) Anxiety Depression Lumbar spinal stenosis    GI/Hepatic Neg liver ROS, GERD  Medicated,  Endo/Other  diabetes, Type 2, Insulin Dependent, Oral Hypoglycemic Agents  Renal/GU Renal disease (h/o renal cell carcinoma)   Overactive bladder    Musculoskeletal  (+) Arthritis , Chronic back pain with radiculopathy   Abdominal (+) + obese,   Peds  Hematology  (+) Blood dyscrasia (pernicious), anemia ,   Anesthesia Other Findings HLD, h/o prostate cancer, glaucoma  Reproductive/Obstetrics                            Anesthesia Physical Anesthesia Plan  ASA: III  Anesthesia Plan: General and Regional   Post-op Pain Management: GA combined w/ Regional for post-op pain   Induction: Intravenous  Airway Management Planned: Oral ETT and Video Laryngoscope Planned  Additional Equipment:   Intra-op Plan:   Post-operative Plan: Extubation in OR  Informed Consent: I have reviewed the patients History and Physical, chart, labs and discussed the procedure including the risks, benefits and alternatives for the proposed anesthesia with the patient or  authorized representative who has indicated his/her understanding and acceptance.   Dental advisory given  Plan Discussed with: CRNA  Anesthesia Plan Comments: (Risks of general anesthesia discussed including, but not limited to, sore throat, hoarse voice, chipped/damaged teeth, injury to vocal cords, nausea and vomiting, allergic reactions, lung infection, heart attack, stroke, and death. All questions answered.  Discussed potential risks of nerve blocks including, but not limited to, infection, bleeding, nerve damage, seizures, pneumothorax, respiratory depression, and potential failure of the block. Alternatives to nerve blocks discussed. All questions answered. )       Anesthesia Quick Evaluation

## 2016-01-20 ENCOUNTER — Ambulatory Visit (HOSPITAL_BASED_OUTPATIENT_CLINIC_OR_DEPARTMENT_OTHER): Payer: Medicare Other | Admitting: Anesthesiology

## 2016-01-20 ENCOUNTER — Encounter (HOSPITAL_BASED_OUTPATIENT_CLINIC_OR_DEPARTMENT_OTHER)
Admission: RE | Disposition: A | Discharge status: Home / Self Care | Payer: Self-pay | Source: Ambulatory Visit | Attending: Orthopedic Surgery

## 2016-01-20 ENCOUNTER — Ambulatory Visit (HOSPITAL_BASED_OUTPATIENT_CLINIC_OR_DEPARTMENT_OTHER)
Admission: RE | Admit: 2016-01-20 | Discharge: 2016-01-20 | Disposition: A | Discharge status: Home / Self Care | Payer: Medicare Other | Source: Ambulatory Visit | Attending: Orthopedic Surgery | Admitting: Orthopedic Surgery

## 2016-01-20 ENCOUNTER — Encounter (HOSPITAL_BASED_OUTPATIENT_CLINIC_OR_DEPARTMENT_OTHER): Payer: Self-pay | Admitting: Anesthesiology

## 2016-01-20 DIAGNOSIS — E119 Type 2 diabetes mellitus without complications: Secondary | ICD-10-CM | POA: Insufficient documentation

## 2016-01-20 DIAGNOSIS — M1990 Unspecified osteoarthritis, unspecified site: Secondary | ICD-10-CM | POA: Insufficient documentation

## 2016-01-20 DIAGNOSIS — M7542 Impingement syndrome of left shoulder: Secondary | ICD-10-CM | POA: Insufficient documentation

## 2016-01-20 DIAGNOSIS — M7552 Bursitis of left shoulder: Secondary | ICD-10-CM | POA: Diagnosis not present

## 2016-01-20 DIAGNOSIS — M24112 Other articular cartilage disorders, left shoulder: Secondary | ICD-10-CM | POA: Diagnosis not present

## 2016-01-20 DIAGNOSIS — G8929 Other chronic pain: Secondary | ICD-10-CM | POA: Insufficient documentation

## 2016-01-20 DIAGNOSIS — G4730 Sleep apnea, unspecified: Secondary | ICD-10-CM | POA: Diagnosis not present

## 2016-01-20 DIAGNOSIS — Z85528 Personal history of other malignant neoplasm of kidney: Secondary | ICD-10-CM | POA: Diagnosis not present

## 2016-01-20 DIAGNOSIS — K219 Gastro-esophageal reflux disease without esophagitis: Secondary | ICD-10-CM | POA: Diagnosis not present

## 2016-01-20 DIAGNOSIS — M25512 Pain in left shoulder: Secondary | ICD-10-CM | POA: Diagnosis not present

## 2016-01-20 DIAGNOSIS — Z794 Long term (current) use of insulin: Secondary | ICD-10-CM | POA: Insufficient documentation

## 2016-01-20 DIAGNOSIS — M19012 Primary osteoarthritis, left shoulder: Secondary | ICD-10-CM | POA: Diagnosis not present

## 2016-01-20 DIAGNOSIS — G8918 Other acute postprocedural pain: Secondary | ICD-10-CM | POA: Diagnosis not present

## 2016-01-20 DIAGNOSIS — F418 Other specified anxiety disorders: Secondary | ICD-10-CM | POA: Insufficient documentation

## 2016-01-20 DIAGNOSIS — M4806 Spinal stenosis, lumbar region: Secondary | ICD-10-CM | POA: Insufficient documentation

## 2016-01-20 DIAGNOSIS — I10 Essential (primary) hypertension: Secondary | ICD-10-CM | POA: Diagnosis not present

## 2016-01-20 HISTORY — PX: SHOULDER ARTHROSCOPY WITH SUBACROMIAL DECOMPRESSION: SHX5684

## 2016-01-20 HISTORY — DX: Anxiety disorder, unspecified: F41.9

## 2016-01-20 LAB — GLUCOSE, CAPILLARY
GLUCOSE-CAPILLARY: 117 mg/dL — AB (ref 65–99)
Glucose-Capillary: 112 mg/dL — ABNORMAL HIGH (ref 65–99)

## 2016-01-20 SURGERY — SHOULDER ARTHROSCOPY WITH SUBACROMIAL DECOMPRESSION
Anesthesia: Regional | Site: Shoulder | Laterality: Left

## 2016-01-20 MED ORDER — PROMETHAZINE HCL 25 MG/ML IJ SOLN
6.2500 mg | INTRAMUSCULAR | Status: DC | PRN
Start: 2016-01-20 — End: 2016-01-20

## 2016-01-20 MED ORDER — FENTANYL CITRATE (PF) 100 MCG/2ML IJ SOLN
50.0000 ug | INTRAMUSCULAR | Status: DC | PRN
  Administered 2016-01-20 (×2): 50 ug via INTRAVENOUS

## 2016-01-20 MED ORDER — OXYCODONE-ACETAMINOPHEN 5-325 MG PO TABS: 1.0000 | ORAL_TABLET | ORAL | 0 refills | Status: DC | PRN

## 2016-01-20 MED ORDER — PHENYLEPHRINE HCL 10 MG/ML IJ SOLN
INTRAVENOUS | Status: DC | PRN
  Administered 2016-01-20: 50 ug/min via INTRAVENOUS

## 2016-01-20 MED ORDER — SUCCINYLCHOLINE CHLORIDE 20 MG/ML IJ SOLN
INTRAMUSCULAR | Status: DC | PRN
  Administered 2016-01-20: 100 mg via INTRAVENOUS

## 2016-01-20 MED ORDER — DEXAMETHASONE SODIUM PHOSPHATE 10 MG/ML IJ SOLN
INTRAMUSCULAR | Status: AC
  Filled 2016-01-20: qty 1

## 2016-01-20 MED ORDER — PROPOFOL 10 MG/ML IV BOLUS
INTRAVENOUS | Status: DC | PRN
  Administered 2016-01-20: 200 mg via INTRAVENOUS

## 2016-01-20 MED ORDER — HYDROCODONE-ACETAMINOPHEN 5-325 MG PO TABS: 1.0000 | ORAL_TABLET | Freq: Four times a day (QID) | ORAL | 0 refills | Status: DC | PRN

## 2016-01-20 MED ORDER — CHLORHEXIDINE GLUCONATE 4 % EX LIQD
60.0000 mL | Freq: Once | CUTANEOUS | Status: DC
Start: 2016-01-20 — End: 2016-01-20

## 2016-01-20 MED ORDER — LIDOCAINE 2% (20 MG/ML) 5 ML SYRINGE
INTRAMUSCULAR | Status: AC
  Filled 2016-01-20: qty 5

## 2016-01-20 MED ORDER — CEFAZOLIN SODIUM-DEXTROSE 2-4 GM/100ML-% IV SOLN
INTRAVENOUS | Status: AC
  Filled 2016-01-20: qty 100

## 2016-01-20 MED ORDER — MIDAZOLAM HCL 2 MG/2ML IJ SOLN
1.0000 mg | INTRAMUSCULAR | Status: DC | PRN
  Administered 2016-01-20: 1 mg via INTRAVENOUS

## 2016-01-20 MED ORDER — BUPIVACAINE HCL (PF) 0.5 % IJ SOLN
INTRAMUSCULAR | Status: DC | PRN
  Administered 2016-01-20: 30 mL via PERINEURAL

## 2016-01-20 MED ORDER — ONDANSETRON HCL 4 MG PO TABS: 4.0000 mg | ORAL_TABLET | Freq: Three times a day (TID) | ORAL | 0 refills | Status: DC | PRN

## 2016-01-20 MED ORDER — FENTANYL CITRATE (PF) 100 MCG/2ML IJ SOLN
INTRAMUSCULAR | Status: AC
  Filled 2016-01-20: qty 2

## 2016-01-20 MED ORDER — GLYCOPYRROLATE 0.2 MG/ML IJ SOLN: 0.2000 mg | Freq: Once | INTRAMUSCULAR | Status: DC | PRN

## 2016-01-20 MED ORDER — LIDOCAINE HCL (CARDIAC) 20 MG/ML IV SOLN
INTRAVENOUS | Status: DC | PRN
  Administered 2016-01-20: 100 mg via INTRAVENOUS

## 2016-01-20 MED ORDER — ONDANSETRON HCL 4 MG/2ML IJ SOLN
INTRAMUSCULAR | Status: DC | PRN
  Administered 2016-01-20: 4 mg via INTRAVENOUS

## 2016-01-20 MED ORDER — EPHEDRINE 5 MG/ML INJ
INTRAVENOUS | Status: AC
  Filled 2016-01-20: qty 10

## 2016-01-20 MED ORDER — SCOPOLAMINE 1 MG/3DAYS TD PT72: 1.0000 | MEDICATED_PATCH | Freq: Once | TRANSDERMAL | Status: DC | PRN

## 2016-01-20 MED ORDER — FENTANYL CITRATE (PF) 100 MCG/2ML IJ SOLN: 25.0000 ug | INTRAMUSCULAR | Status: DC | PRN

## 2016-01-20 MED ORDER — CEFAZOLIN SODIUM-DEXTROSE 2-4 GM/100ML-% IV SOLN
2.0000 g | INTRAVENOUS | Status: AC
  Administered 2016-01-20: 2 g via INTRAVENOUS

## 2016-01-20 MED ORDER — DEXAMETHASONE SODIUM PHOSPHATE 4 MG/ML IJ SOLN
INTRAMUSCULAR | Status: DC | PRN
  Administered 2016-01-20: 10 mg via INTRAVENOUS

## 2016-01-20 MED ORDER — LACTATED RINGERS IV SOLN
INTRAVENOUS | Status: DC
  Administered 2016-01-20: 07:00:00 via INTRAVENOUS

## 2016-01-20 MED ORDER — MIDAZOLAM HCL 2 MG/2ML IJ SOLN
INTRAMUSCULAR | Status: AC
  Filled 2016-01-20: qty 2

## 2016-01-20 MED ORDER — ONDANSETRON HCL 4 MG/2ML IJ SOLN
INTRAMUSCULAR | Status: AC
  Filled 2016-01-20: qty 2

## 2016-01-20 MED ORDER — SUCCINYLCHOLINE CHLORIDE 200 MG/10ML IV SOSY
PREFILLED_SYRINGE | INTRAVENOUS | Status: AC
  Filled 2016-01-20: qty 10

## 2016-01-20 MED ORDER — EPHEDRINE SULFATE 50 MG/ML IJ SOLN
INTRAMUSCULAR | Status: DC | PRN
  Administered 2016-01-20 (×2): 10 mg via INTRAVENOUS

## 2016-01-20 MED ORDER — PROPOFOL 500 MG/50ML IV EMUL
INTRAVENOUS | Status: AC
  Filled 2016-01-20: qty 50

## 2016-01-20 MED ORDER — SODIUM CHLORIDE 0.9 % IR SOLN
Status: DC | PRN
  Administered 2016-01-20: 6000 mL

## 2016-01-20 SURGICAL SUPPLY — 73 items
APL SKNCLS STERI-STRIP NONHPOA (GAUZE/BANDAGES/DRESSINGS)
BENZOIN TINCTURE PRP APPL 2/3 (GAUZE/BANDAGES/DRESSINGS) IMPLANT
BLADE CUTTER GATOR 3.5 (BLADE) ×4 IMPLANT
BLADE CUTTER MENIS 5.5 (BLADE) IMPLANT
BLADE GREAT WHITE 4.2 (BLADE) ×3 IMPLANT
BLADE GREAT WHITE 4.2MM (BLADE) ×1
BLADE SURG 15 STRL LF DISP TIS (BLADE) ×2 IMPLANT
BLADE SURG 15 STRL SS (BLADE) ×4
BUR OVAL 6.0 (BURR) ×4 IMPLANT
CANNULA DRY DOC 8X75 (CANNULA) IMPLANT
CANNULA TWIST IN 8.25X7CM (CANNULA) IMPLANT
CLOSURE WOUND 1/2 X4 (GAUZE/BANDAGES/DRESSINGS)
DECANTER SPIKE VIAL GLASS SM (MISCELLANEOUS) IMPLANT
DRAPE STERI 35X30 U-POUCH (DRAPES) ×4 IMPLANT
DRAPE U-SHAPE 47X51 STRL (DRAPES) ×4 IMPLANT
DRAPE U-SHAPE 76X120 STRL (DRAPES) ×8 IMPLANT
DRSG PAD ABDOMINAL 8X10 ST (GAUZE/BANDAGES/DRESSINGS) ×4 IMPLANT
DURAPREP 26ML APPLICATOR (WOUND CARE) ×4 IMPLANT
ELECT MENISCUS 165MM 90D (ELECTRODE) ×4 IMPLANT
ELECT REM PT RETURN 9FT ADLT (ELECTROSURGICAL) ×4
ELECTRODE REM PT RTRN 9FT ADLT (ELECTROSURGICAL) ×2 IMPLANT
GAUZE SPONGE 4X4 12PLY STRL (GAUZE/BANDAGES/DRESSINGS) ×8 IMPLANT
GAUZE XEROFORM 1X8 LF (GAUZE/BANDAGES/DRESSINGS) ×4 IMPLANT
GLOVE BIO SURGEON STRL SZ7 (GLOVE) ×4 IMPLANT
GLOVE BIOGEL PI IND STRL 7.0 (GLOVE) ×4 IMPLANT
GLOVE BIOGEL PI IND STRL 7.5 (GLOVE) ×2 IMPLANT
GLOVE BIOGEL PI INDICATOR 7.0 (GLOVE) ×4
GLOVE BIOGEL PI INDICATOR 7.5 (GLOVE) ×2
GLOVE ECLIPSE 6.5 STRL STRAW (GLOVE) ×4 IMPLANT
GLOVE ECLIPSE 7.0 STRL STRAW (GLOVE) ×4 IMPLANT
GLOVE SURG ORTHO 8.0 STRL STRW (GLOVE) ×4 IMPLANT
GOWN STRL REUS W/ TWL LRG LVL3 (GOWN DISPOSABLE) ×2 IMPLANT
GOWN STRL REUS W/ TWL XL LVL3 (GOWN DISPOSABLE) ×4 IMPLANT
GOWN STRL REUS W/TWL LRG LVL3 (GOWN DISPOSABLE) ×4
GOWN STRL REUS W/TWL XL LVL3 (GOWN DISPOSABLE) ×8
IV NS IRRIG 3000ML ARTHROMATIC (IV SOLUTION) ×8 IMPLANT
MANIFOLD NEPTUNE II (INSTRUMENTS) ×4 IMPLANT
NDL SUT 6 .5 CRC .975X.05 MAYO (NEEDLE) IMPLANT
NEEDLE MAYO TAPER (NEEDLE)
NEEDLE SCORPION MULTI FIRE (NEEDLE) IMPLANT
NS IRRIG 1000ML POUR BTL (IV SOLUTION) IMPLANT
PACK ARTHROSCOPY DSU (CUSTOM PROCEDURE TRAY) ×4 IMPLANT
PASSER SUT SWANSON 36MM LOOP (INSTRUMENTS) IMPLANT
PENCIL BUTTON HOLSTER BLD 10FT (ELECTRODE) ×4 IMPLANT
SET ARTHROSCOPY TUBING (MISCELLANEOUS) ×4
SET ARTHROSCOPY TUBING LN (MISCELLANEOUS) ×2 IMPLANT
SLEEVE SCD COMPRESS KNEE MED (MISCELLANEOUS) ×4 IMPLANT
SLING ARM FOAM STRAP LRG (SOFTGOODS) IMPLANT
SLING ARM IMMOBILIZER LRG (SOFTGOODS) IMPLANT
SLING ARM IMMOBILIZER MED (SOFTGOODS) IMPLANT
SLING ARM MED ADULT FOAM STRAP (SOFTGOODS) IMPLANT
SLING ARM XL FOAM STRAP (SOFTGOODS) ×4 IMPLANT
SPONGE LAP 4X18 X RAY DECT (DISPOSABLE) IMPLANT
STRIP CLOSURE SKIN 1/2X4 (GAUZE/BANDAGES/DRESSINGS) IMPLANT
SUCTION FRAZIER HANDLE 10FR (MISCELLANEOUS)
SUCTION TUBE FRAZIER 10FR DISP (MISCELLANEOUS) IMPLANT
SUT ETHIBOND 2 OS 4 DA (SUTURE) IMPLANT
SUT ETHILON 2 0 FS 18 (SUTURE) IMPLANT
SUT ETHILON 3 0 PS 1 (SUTURE) IMPLANT
SUT FIBERWIRE #2 38 T-5 BLUE (SUTURE)
SUT RETRIEVER MED (INSTRUMENTS) IMPLANT
SUT TIGER TAPE 7 IN WHITE (SUTURE) IMPLANT
SUT VIC AB 0 CT1 27 (SUTURE)
SUT VIC AB 0 CT1 27XBRD ANBCTR (SUTURE) IMPLANT
SUT VIC AB 2-0 SH 27 (SUTURE)
SUT VIC AB 2-0 SH 27XBRD (SUTURE) IMPLANT
SUT VIC AB 3-0 FS2 27 (SUTURE) IMPLANT
SUTURE FIBERWR #2 38 T-5 BLUE (SUTURE) IMPLANT
TAPE FIBER 2MM 7IN #2 BLUE (SUTURE) IMPLANT
TOWEL OR 17X24 6PK STRL BLUE (TOWEL DISPOSABLE) ×4 IMPLANT
TOWEL OR NON WOVEN STRL DISP B (DISPOSABLE) ×4 IMPLANT
WATER STERILE IRR 1000ML POUR (IV SOLUTION) ×4 IMPLANT
YANKAUER SUCT BULB TIP NO VENT (SUCTIONS) IMPLANT

## 2016-01-20 NOTE — Transfer of Care (Signed)
Immediate Anesthesia Transfer of Care Note  Patient: Antonio Adams  Procedure(s) Performed: Procedure(s): LEFT SHOULDER ARTHROSCOPY WITH EXTENSIVE  DEBRIDEMENT, ACROMIOPLASTY (Left)  Patient Location: PACU  Anesthesia Type:General  Level of Consciousness: awake and sedated  Airway & Oxygen Therapy: Patient Spontanous Breathing and Patient connected to face mask oxygen  Post-op Assessment: Report given to RN and Post -op Vital signs reviewed and stable  Post vital signs: Reviewed and stable  Last Vitals:  Vitals:   01/20/16 0654 01/20/16 0700  BP: (!) 151/89 (!) 157/85  Pulse: 70 69  Resp: 16 14  Temp:      Last Pain: There were no vitals filed for this visit.    Patients Stated Pain Goal: 0 (53/66/44 0347)  Complications: No apparent anesthesia complications

## 2016-01-20 NOTE — Progress Notes (Signed)
Assisted Dr. Neoma Laming with left, ultrasound guided, interscalene  block. Side rails up, monitors on throughout procedure. See vital signs in flow sheet. Tolerated Procedure well.

## 2016-01-20 NOTE — Discharge Instructions (Signed)
Shouder arthroscopy, partial rotator cuff tear debridement subacromial decompression Care After Instructions Refer to this sheet in the next few weeks. These discharge instructions provide you with general information on caring for yourself after you leave the hospital. Your caregiver may also give you specific instructions. Your treatment has been planned according to the most current medical practices available, but unavoidable complications sometimes occur. If you have any problems or questions after discharge, please call your caregiver. HOME INSTRUCTIONS You may resume a normal diet and activities as directed. Take showers instead of baths until informed otherwise.  Change bandages (dressings) in 3 days.  Swab wounds daily with betadine.  Wash shoulder with soap and water.  Pat dry.  Cover wounds with bandaids. Only take over-the-counter or prescription medicines for pain, discomfort, or fever as directed by your caregiver.  Wear your sling for the next 2 days unless otherwise instructed. Eat a well-balanced diet.  Avoid lifting or driving until you are instructed otherwise.  Make an appointment to see your caregiver for stitches (suture) or staple removal one week after surgery.   Post Anesthesia Home Care Instructions  Activity: Get plenty of rest for the remainder of the day. A responsible adult should stay with you for 24 hours following the procedure.  For the next 24 hours, DO NOT: -Drive a car -Paediatric nurse -Drink alcoholic beverages -Take any medication unless instructed by your physician -Make any legal decisions or sign important papers.  Meals: Start with liquid foods such as gelatin or soup. Progress to regular foods as tolerated. Avoid greasy, spicy, heavy foods. If nausea and/or vomiting occur, drink only clear liquids until the nausea and/or vomiting subsides. Call your physician if vomiting continues.  Special Instructions/Symptoms: Your throat may feel dry or  sore from the anesthesia or the breathing tube placed in your throat during surgery. If this causes discomfort, gargle with warm salt water. The discomfort should disappear within 24 hours.  If you had a scopolamine patch placed behind your ear for the management of post- operative nausea and/or vomiting:  1. The medication in the patch is effective for 72 hours, after which it should be removed.  Wrap patch in a tissue and discard in the trash. Wash hands thoroughly with soap and water. 2. You may remove the patch earlier than 72 hours if you experience unpleasant side effects which may include dry mouth, dizziness or visual disturbances. 3. Avoid touching the patch. Wash your hands with soap and water after contact with the patch.   Regional Anesthesia Blocks  1. Numbness or the inability to move the "blocked" extremity may last from 3-48 hours after placement. The length of time depends on the medication injected and your individual response to the medication. If the numbness is not going away after 48 hours, call your surgeon.  2. The extremity that is blocked will need to be protected until the numbness is gone and the  Strength has returned. Because you cannot feel it, you will need to take extra care to avoid injury. Because it may be weak, you may have difficulty moving it or using it. You may not know what position it is in without looking at it while the block is in effect.  3. For blocks in the legs and feet, returning to weight bearing and walking needs to be done carefully. You will need to wait until the numbness is entirely gone and the strength has returned. You should be able to move your leg and foot normally  before you try and bear weight or walk. You will need someone to be with you when you first try to ensure you do not fall and possibly risk injury.  4. Bruising and tenderness at the needle site are common side effects and will resolve in a few days.  5. Persistent numbness  or new problems with movement should be communicated to the surgeon or the [place] [phone] [place] [phone]).  SEEK MEDICAL CARE IF: You have swelling of your calf or leg.  You develop shortness of breath or chest pain.  You have redness, swelling, or increasing pain in the wound.  There is pus or any unusual drainage coming from the surgical site.  You notice a bad smell coming from the surgical site or dressing.  The surgical site breaks open after sutures or staples have been removed.  There is persistent bleeding from the suture or staple line.  You are getting worse or are not improving.  You have any other questions or concerns.  SEEK IMMEDIATE MEDICAL CARE IF:  You have a fever greater than 101 You develop a rash.  You have difficulty breathing.  You develop any reaction or side effects to medicines given.  Your knee motion is decreasing rather than improving.  MAKE SURE YOU:  Understand these instructions.  Will watch your condition.  Will get help right away if you are not doing well or get worse.  Post Anesthesia Home Care Instructions  Activity: Get plenty of rest for the remainder of the day. A responsible adult should stay with you for 24 hours following the procedure.  For the next 24 hours, DO NOT: -Drive a car -Paediatric nurse -Drink alcoholic beverages -Take any medication unless instructed by your physician -Make any legal decisions or sign important papers.  Meals: Start with liquid foods such as gelatin or soup. Progress to regular foods as tolerated. Avoid greasy, spicy, heavy foods. If nausea and/or vomiting occur, drink only clear liquids until the nausea and/or vomiting subsides. Call your physician if vomiting continues.  Special Instructions/Symptoms: Your throat may feel dry or sore from the anesthesia or the breathing tube placed in your throat during surgery. If this causes discomfort, gargle with warm  salt water. The discomfort should disappear within 24 hours.  If you had a scopolamine patch placed behind your ear for the management of post- operative nausea and/or vomiting:  1. The medication in the patch is effective for 72 hours, after which it should be removed.  Wrap patch in a tissue and discard in the trash. Wash hands thoroughly with soap and water. 2. You may remove the patch earlier than 72 hours if you experience unpleasant side effects which may include dry mouth, dizziness or visual disturbances. 3. Avoid touching the patch. Wash your hands with soap and water after contact with the patch.   Regional Anesthesia Blocks  1. Numbness or the inability to move the "blocked" extremity may last from 3-48 hours after placement. The length of time depends on the medication injected and your individual response to the medication. If the numbness is not going away after 48 hours, call your surgeon.  2. The extremity that is blocked will need to be protected until the numbness is gone and the  Strength has returned. Because you cannot feel it, you will need to take extra care to avoid injury. Because it may be weak, you may have difficulty moving it or using it. You may not  know what position it is in without looking at it while the block is in effect.  3. For blocks in the legs and feet, returning to weight bearing and walking needs to be done carefully. You will need to wait until the numbness is entirely gone and the strength has returned. You should be able to move your leg and foot normally before you try and bear weight or walk. You will need someone to be with you when you first try to ensure you do not fall and possibly risk injury.  4. Bruising and tenderness at the needle site are common side effects and will resolve in a few days.  5. Persistent numbness or new problems with movement should be communicated to the surgeon or the [place] [phone] [place] [phone]).  Call your surgeon if you experience:   1.  Fever over 101.0. 2.  Inability to urinate. 3.  Nausea and/or vomiting. 4.  Extreme swelling or bruising at the surgical site. 5.  Continued bleeding from the incision. 6.  Increased pain, redness or drainage from the incision. 7.  Problems related to your pain medication. 8.  Any problems and/or concerns

## 2016-01-20 NOTE — Interval H&P Note (Signed)
History and Physical Interval Note:  01/20/2016 7:39 AM  Antonio Adams  has presented today for surgery, with the diagnosis of LEFT SHOULDER ARTHROSCOPY OA, STRAIN OF MUSCLE AND TENDON OF RHT ROTATOR CUFF  The various methods of treatment have been discussed with the patient and family. After consideration of risks, benefits and other options for treatment, the patient has consented to  Procedure(s): LEFT SHOULDER ARTHROSCOPY DEBRIDEMENT,ACROMIOPLASTY,DISTAL CALVICAL EXCISION,ROTATOR CUFF REPAIR,BICEP TENODESIS (Left) as a surgical intervention .  The patient's history has been reviewed, patient examined, no change in status, stable for surgery.  I have reviewed the patient's chart and labs.  Questions were answered to the patient's satisfaction.     Antonio Adams

## 2016-01-20 NOTE — Anesthesia Procedure Notes (Signed)
Procedure Name: Intubation Performed by: Terrance Mass Pre-anesthesia Checklist: Patient identified, Emergency Drugs available, Suction available and Patient being monitored Patient Re-evaluated:Patient Re-evaluated prior to inductionOxygen Delivery Method: Circle system utilized Preoxygenation: Pre-oxygenation with 100% oxygen Intubation Type: IV induction Ventilation: Mask ventilation without difficulty Tube type: Oral Number of attempts: 1 Airway Equipment and Method: Oral airway,  Rigid stylet and Video-laryngoscopy Placement Confirmation: ETT inserted through vocal cords under direct vision,  positive ETCO2 and breath sounds checked- equal and bilateral Secured at: 22 cm Tube secured with: Tape Dental Injury: Teeth and Oropharynx as per pre-operative assessment

## 2016-01-20 NOTE — Anesthesia Postprocedure Evaluation (Signed)
Anesthesia Post Note  Patient: Antonio Adams  Procedure(s) Performed: Procedure(s) (LRB): LEFT SHOULDER ARTHROSCOPY WITH EXTENSIVE  DEBRIDEMENT, ACROMIOPLASTY (Left)  Patient location during evaluation: PACU Anesthesia Type: General Level of consciousness: awake and alert Pain management: pain level controlled Vital Signs Assessment: post-procedure vital signs reviewed and stable Respiratory status: spontaneous breathing, nonlabored ventilation and respiratory function stable Cardiovascular status: blood pressure returned to baseline and stable Postop Assessment: no signs of nausea or vomiting Anesthetic complications: no    Last Vitals:  Vitals:   01/20/16 1000 01/20/16 1047  BP: (!) 139/92 127/74  Pulse: 77 79  Resp: 18 18  Temp:  36.4 C    Last Pain:  Vitals:   01/20/16 1047  TempSrc: Oral  PainSc: 0-No pain                 Nilda Simmer

## 2016-01-20 NOTE — Anesthesia Procedure Notes (Signed)
Anesthesia Regional Block:  Interscalene brachial plexus block  Pre-Anesthetic Checklist: ,, timeout performed, Correct Patient, Correct Site, Correct Laterality, Correct Procedure, Correct Position, site marked, Risks and benefits discussed,  Surgical consent,  Pre-op evaluation,  At surgeon's request and post-op pain management  Laterality: Left  Prep: chloraprep       Needles:  Injection technique: Single-shot  Needle Type: Echogenic Stimulator Needle     Needle Length: 10cm 10 cm Needle Gauge: 21 and 21 G    Additional Needles:  Procedures: ultrasound guided (picture in chart) Interscalene brachial plexus block Narrative:  Start time: 01/20/2016 6:55 AM End time: 01/20/2016 7:00 AM Injection made incrementally with aspirations every 5 mL.  Performed by: Personally  Anesthesiologist: Nilda Simmer

## 2016-01-21 ENCOUNTER — Encounter (HOSPITAL_BASED_OUTPATIENT_CLINIC_OR_DEPARTMENT_OTHER): Payer: Self-pay | Admitting: Orthopedic Surgery

## 2016-01-21 NOTE — Op Note (Signed)
NAME:  Antonio Adams, Antonio Adams NO.:  [id number]  MEDICAL RECORD NO.:  [id number]  LOCATION:                                 FACILITY:  PHYSICIAN:  Ninetta Lights, M.D. DATE OF BIRTH:  May 12, 1946  DATE OF PROCEDURE:  01/20/2016 DATE OF DISCHARGE:                              OPERATIVE REPORT   PREOPERATIVE DIAGNOSES: 1. Left shoulder degenerative arthritis. 2. Recurrent subacromial bursitis, impingement after previous     decompression.  POSTOPERATIVE DIAGNOSES: 1. Left shoulder degenerative arthritis. 2. Recurrent subacromial bursitis, impingement after previous     decompression. 3. Grade 3 and 4 changes, glenohumeral joint; degenerative tearing,     labrum; partial tearing, rotator cuff above and below, but no full-     thickness tears. 4. Recurrent impingement reactive bursitis.  PROCEDURES: 1. Left shoulder exam under anesthesia, arthroscopy. 2. Chondroplasty and debridement of glenohumeral joint. 3. Debridement of labrum. 4. Debridement of rotator cuff above and below. 5. Bursectomy. 6. Revision acromioplasty of the very front edge with re-release of CA     ligament.  SURGEON:  Ninetta Lights, M.D.  ASSISTANT:  Lowell Guitar. Mancel Bale, [place], present throughout the entire case and necessary for timely completion of procedure.  ANESTHESIA:  General.  BLOOD LOSS:  Minimal.  SPECIMENS:  None.  CULTURES:  None.  COMPLICATIONS:  None.  DRESSING:  Sterile compressive with sling.  DESCRIPTION OF PROCEDURE:  The patient was brought to the operating room, and after adequate anesthesia had been obtained, placed in beach- chair position on shoulder positioner, prepped and draped in the usual sterile fashion.  He extends but I could get just about full motion, no adhesions.  Three portals anterior, posterior, lateral.  Arthroscope introduced, shoulder distended and inspected.  A lot of chondral debris debrided.  Circumferential degenerative labral tears  debrided.  Biceps tendon and anchor were still intact.  Thorough chondroplasty.  Removal of loose bodies.  Debridement of labrum.  I then debrided the undersurface of the cuff, and there was considerable tendinopathy, partial tears, but nothing full thickness.  Cannula redirected subacromially.  Bursa resected.  Again, the top of the cuff debrided with a lot of attrition, but no full-thickness tears.  Previous acromioplasty below and bony overgrowth at the front that were re- debrided with acromioplasty.  CA ligament released again.  Distal clavicle had been resected and was not imposing on subacromial space. Instruments were removed.  Portals were closed with nylon.  Sterile compressive dressing applied.  Sling applied.  Anesthesia reversed. Brought to the recovery room.  Tolerated the surgery well.  No complications.     Ninetta Lights, M.D.   ______________________________ Ninetta Lights, M.D.    DFM/MEDQ  D:  01/20/2016  T:  01/21/2016  Job:  [id number]

## 2016-01-24 DIAGNOSIS — C641 Malignant neoplasm of right kidney, except renal pelvis: Secondary | ICD-10-CM | POA: Diagnosis not present

## 2016-01-28 DIAGNOSIS — M24112 Other articular cartilage disorders, left shoulder: Secondary | ICD-10-CM | POA: Diagnosis not present

## 2016-02-02 DIAGNOSIS — M7542 Impingement syndrome of left shoulder: Secondary | ICD-10-CM | POA: Diagnosis not present

## 2016-02-02 DIAGNOSIS — R531 Weakness: Secondary | ICD-10-CM | POA: Diagnosis not present

## 2016-02-02 DIAGNOSIS — M25512 Pain in left shoulder: Secondary | ICD-10-CM | POA: Diagnosis not present

## 2016-02-02 DIAGNOSIS — M25612 Stiffness of left shoulder, not elsewhere classified: Secondary | ICD-10-CM | POA: Diagnosis not present

## 2016-02-08 ENCOUNTER — Ambulatory Visit (AMBULATORY_SURGERY_CENTER): Payer: Self-pay | Admitting: *Deleted

## 2016-02-08 ENCOUNTER — Encounter: Payer: Self-pay | Admitting: Internal Medicine

## 2016-02-08 VITALS — Ht 71.0 in | Wt 224.8 lb

## 2016-02-08 DIAGNOSIS — Z86010 Personal history of colonic polyps: Secondary | ICD-10-CM

## 2016-02-08 DIAGNOSIS — D3A00 Benign carcinoid tumor of unspecified site: Secondary | ICD-10-CM

## 2016-02-08 MED ORDER — NA SULFATE-K SULFATE-MG SULF 17.5-3.13-1.6 GM/177ML PO SOLN: 1.0000 | Freq: Once | ORAL | 0 refills | Status: AC

## 2016-02-08 NOTE — Progress Notes (Signed)
No egg or soy allergy known to patient  No issues with past sedation with any surgeries  or procedures, no intubation problems  No diet pills per patient No home 02 use per patient  No blood thinners per patient  Pt denies issues with constipation  No A fib or A flutter   

## 2016-02-09 DIAGNOSIS — M25612 Stiffness of left shoulder, not elsewhere classified: Secondary | ICD-10-CM | POA: Diagnosis not present

## 2016-02-09 DIAGNOSIS — M25512 Pain in left shoulder: Secondary | ICD-10-CM | POA: Diagnosis not present

## 2016-02-09 DIAGNOSIS — R531 Weakness: Secondary | ICD-10-CM | POA: Diagnosis not present

## 2016-02-09 DIAGNOSIS — M7542 Impingement syndrome of left shoulder: Secondary | ICD-10-CM | POA: Diagnosis not present

## 2016-02-21 ENCOUNTER — Telehealth: Payer: Self-pay | Admitting: Internal Medicine

## 2016-02-21 NOTE — Telephone Encounter (Signed)
Returned patient's call and there was no answer.  I left message on his voicemail for him to call me directly at [phone].

## 2016-02-21 NOTE — Telephone Encounter (Signed)
Patient returned my call and insisted that he has to eat when he takes his medication for diabetes.  I advised him that we do not allow patient's to eat the day before regardless if they are diabetic or not.  He states that it makes him very nauseated if he doesn't eat.  I again told him that he can have broth and Jello when he takes his medication but he stated that it was not solid enough.  I gave him the choice of eating broth and jello with taking his medication or not taking the medication and keep close watch on his blood sugar readings.  He said either choice was not good enough.  I hope he will comply as he did not seem satisfied with our advice.

## 2016-02-22 ENCOUNTER — Ambulatory Visit (AMBULATORY_SURGERY_CENTER): Payer: Medicare Other | Admitting: Internal Medicine

## 2016-02-22 ENCOUNTER — Encounter: Payer: Self-pay | Admitting: Internal Medicine

## 2016-02-22 VITALS — BP 130/88 | HR 71 | Temp 97.5°F | Resp 22 | Ht 71.0 in | Wt 224.0 lb

## 2016-02-22 DIAGNOSIS — D120 Benign neoplasm of cecum: Secondary | ICD-10-CM

## 2016-02-22 DIAGNOSIS — K2980 Duodenitis without bleeding: Secondary | ICD-10-CM | POA: Diagnosis not present

## 2016-02-22 DIAGNOSIS — D122 Benign neoplasm of ascending colon: Secondary | ICD-10-CM

## 2016-02-22 DIAGNOSIS — Z86010 Personal history of colonic polyps: Secondary | ICD-10-CM | POA: Diagnosis present

## 2016-02-22 DIAGNOSIS — K317 Polyp of stomach and duodenum: Secondary | ICD-10-CM

## 2016-02-22 DIAGNOSIS — D508 Other iron deficiency anemias: Secondary | ICD-10-CM

## 2016-02-22 DIAGNOSIS — D3A00 Benign carcinoid tumor of unspecified site: Secondary | ICD-10-CM

## 2016-02-22 LAB — GLUCOSE, CAPILLARY
GLUCOSE-CAPILLARY: 177 mg/dL — AB (ref 65–99)
GLUCOSE-CAPILLARY: 154 mg/dL — AB (ref 65–99)

## 2016-02-22 LAB — HM COLONOSCOPY

## 2016-02-22 MED ORDER — FERROUS SULFATE 325 (65 FE) MG PO TABS: 325.0000 mg | ORAL_TABLET | Freq: Two times a day (BID) | ORAL | 11 refills | Status: DC

## 2016-02-22 MED ORDER — SODIUM CHLORIDE 0.9 % IV SOLN: 500.0000 mL | INTRAVENOUS | Status: DC

## 2016-02-22 NOTE — Op Note (Signed)
[place] Patient Name: Antonio Adams Procedure Date: 02/22/2016 11:22 AM MRN: [id number] Endoscopist: Docia Chuck. Henrene Pastor , MD Age: 70 Referring MD:  Date of Birth: 09/05/1945 Gender: Male Account #: [id number] Procedure:                Colonoscopy, with cold snare polypectomy x 2 Indications:              High risk colon cancer surveillance: Personal                            history of multiple (3 or more) adenomas. Previous                            examinations 2002, 2007, 2012, and 2013 Medicines:                Monitored Anesthesia Care Procedure:                Pre-Anesthesia Assessment:                           - Prior to the procedure, a History and Physical                            was performed, and patient medications and                            allergies were reviewed. The patient's tolerance of                            previous anesthesia was also reviewed. The risks                            and benefits of the procedure and the sedation                            options and risks were discussed with the patient.                            All questions were answered, and informed consent                            was obtained. Prior Anticoagulants: The patient has                            taken no previous anticoagulant or antiplatelet                            agents. ASA Grade Assessment: II - A patient with                            mild systemic disease. After reviewing the risks                            and benefits, the patient was deemed in  satisfactory condition to undergo the procedure.                           After obtaining informed consent, the colonoscope                            was passed under direct vision. Throughout the                            procedure, the patient's blood pressure, pulse, and                            oxygen saturations were monitored continuously. The                Model CF-HQ190L [id number]) scope was introduced                            through the anus and advanced to the the cecum,                            identified by appendiceal orifice and ileocecal                            valve. The ileocecal valve, appendiceal orifice,                            and rectum were photographed. The quality of the                            bowel preparation was excellent. The colonoscopy                            was performed without difficulty. The patient                            tolerated the procedure well. The bowel preparation                            used was SUPREP. Scope In: 11:42:30 AM Scope Out: 11:56:27 AM Scope Withdrawal Time: 0 hours 10 minutes 36 seconds  Total Procedure Duration: 0 hours 13 minutes 57 seconds  Findings:                 Multiple localized angioectasias, secondary to                            radiation proctitis, without bleeding were found in                            the distal rectum.                           Two polyps were found in the ascending colon and  cecum. The polyps were 2 to 5 mm in size. These                            polyps were removed with a cold snare. Resection                            and retrieval were complete.                           Internal hemorrhoids were found during retroflexion.                           The exam was otherwise without abnormality on                            direct and retroflexion views. Complications:            No immediate complications. Estimated blood loss:                            None. Estimated Blood Loss:     Estimated blood loss: none. Impression:               - Radiation proctitis.                           - Two 2 to 5 mm polyps in the ascending colon and                            in the cecum, removed with a cold snare. Resected                            and retrieved.                           -  Internal hemorrhoids.                           - The examination was otherwise normal on direct                            and retroflexion views. Recommendation:           - Repeat colonoscopy in 5 years for surveillance.                           - EGD today (please see results).                           - Prescribe iron sulfate 325 mg; #60; 1 by mouth                            daily; 11 refills.                           - Await pathology results. Docia Chuck. Henrene Pastor, MD 02/22/2016 12:15:47 PM This  report has been signed electronically.

## 2016-02-22 NOTE — Progress Notes (Signed)
Spontaneous respirations throughout. VSS. Resting comfortably. To PACU on room air. Report to  Celia RN. 

## 2016-02-22 NOTE — Patient Instructions (Signed)
Discharge instructions given. Handouts on polyps and hemorrhoids. Resume previous medications. YOU HAD AN ENDOSCOPIC PROCEDURE TODAY AT [facility]:   Refer to the procedure report that was given to you for any specific questions about what was found during the examination.  If the procedure report does not answer your questions, please call your gastroenterologist to clarify.  If you requested that your care partner not be given the details of your procedure findings, then the procedure report has been included in a sealed envelope for you to review at your convenience later.  YOU SHOULD EXPECT: Some feelings of bloating in the abdomen. Passage of more gas than usual.  Walking can help get rid of the air that was put into your GI tract during the procedure and reduce the bloating. If you had a lower endoscopy (such as a colonoscopy or flexible sigmoidoscopy) you may notice spotting of blood in your stool or on the toilet paper. If you underwent a bowel prep for your procedure, you may not have a normal bowel movement for a few days.  Please Note:  You might notice some irritation and congestion in your nose or some drainage.  This is from the oxygen used during your procedure.  There is no need for concern and it should clear up in a day or so.  SYMPTOMS TO REPORT IMMEDIATELY:   Following lower endoscopy (colonoscopy or flexible sigmoidoscopy):  Excessive amounts of blood in the stool  Significant tenderness or worsening of abdominal pains  Swelling of the abdomen that is new, acute  Fever of 100F or higher   Following upper endoscopy (EGD)  Vomiting of blood or coffee ground material  New chest pain or pain under the shoulder blades  Painful or persistently difficult swallowing  New shortness of breath  Fever of 100F or higher  Black, tarry-looking stools  For urgent or emergent issues, a gastroenterologist can be reached at any hour by calling [phone]  [phone].   DIET:  We do recommend a small meal at first, but then you may proceed to your regular diet.  Drink plenty of fluids but you should avoid alcoholic beverages for 24 hours.  ACTIVITY:  You should plan to take it easy for the rest of today and you should NOT DRIVE or use heavy machinery until tomorrow (because of the sedation medicines used during the test).    FOLLOW UP: Our staff will call the number listed on your records the next business day following your procedure to check on you and address any questions or concerns that you may have regarding the information given to you following your procedure. If we do not reach you, we will leave a message.  However, if you are feeling well and you are not experiencing any problems, there is no need to return our call.  We will assume that you have returned to your regular daily activities without incident.  If any biopsies were taken you will be contacted by phone or by letter within the next 1-3 weeks.  Please call us at [phone] if you have not heard about the biopsies in 3 weeks.    SIGNATURES/CONFIDENTIALITY: You and/or your care partner have signed paperwork which will be entered into your electronic medical record.  These signatures attest to the fact that that the information above on your After Visit Summary has been reviewed and is understood.  Full responsibility of the confidentiality of this discharge information lies with you and/or your care-partner.

## 2016-02-22 NOTE — Progress Notes (Signed)
Called to room to assist during endoscopic procedure.  Patient ID and intended procedure confirmed with present staff. Received instructions for my participation in the procedure from the performing physician.  

## 2016-02-22 NOTE — Op Note (Signed)
[place] Patient Name: Antonio Adams Procedure Date: 02/22/2016 11:23 AM MRN: [id number] Endoscopist: Docia Chuck. Henrene Pastor , MD Age: 70 Referring MD:  Date of Birth: 05-Mar-1946 Gender: Male Account #: [id number] Procedure:                Upper GI endoscopy, with biopsies Indications:              Surveillance procedure. Known small duodenal                            carcinoid. Last exam 2015 Medicines:                Monitored Anesthesia Care Procedure:                Pre-Anesthesia Assessment:                           - Prior to the procedure, a History and Physical                            was performed, and patient medications and                            allergies were reviewed. The patient's tolerance of                            previous anesthesia was also reviewed. The risks                            and benefits of the procedure and the sedation                            options and risks were discussed with the patient.                            All questions were answered, and informed consent                            was obtained. Prior Anticoagulants: The patient has                            taken no previous anticoagulant or antiplatelet                            agents. ASA Grade Assessment: II - A patient with                            mild systemic disease. After reviewing the risks                            and benefits, the patient was deemed in                            satisfactory condition to undergo the procedure.  After obtaining informed consent, the endoscope was                            passed under direct vision. Throughout the                            procedure, the patient's blood pressure, pulse, and                            oxygen saturations were monitored continuously. The                            Model GIF-HQ190 [id number]) scope was introduced                            through the  mouth, and advanced to the third part                            of duodenum. The upper GI endoscopy was                            accomplished without difficulty. The patient                            tolerated the procedure well. Scope In: Scope Out: Findings:                 The esophagus was normal.                           The stomach was normal.                           The cardia and gastric fundus were normal on                            retroflexion.                           A single 14 mm broad-basedpolyp was found in the                            duodenal bulb. Biopsies were taken with a cold                            forceps for histology. Complications:            No immediate complications. Estimated Blood Loss:     Estimated blood loss: none. Impression:               - Normal esophagus.                           - Normal stomach.                           - A single duodenal polyp, unchanged from previous  exams. Biopsied. Recommendation:           - Consider EUS and referral to therapeutic                            endoscopist for resection.                           - Resume previous diet.                           - Continue present medications.                           - Await pathology results. Docia Chuck. Henrene Pastor, MD 02/22/2016 12:26:37 PM This report has been signed electronically.

## 2016-02-23 ENCOUNTER — Telehealth: Payer: Self-pay

## 2016-02-23 ENCOUNTER — Telehealth: Payer: Self-pay | Admitting: *Deleted

## 2016-02-23 NOTE — Telephone Encounter (Signed)
  Follow up Call-  Call back number 02/22/2016 07/07/2013  Post procedure Call Back phone  # [phone]  Permission to leave phone message Yes Yes  Some recent data might be hidden     Patient questions:  Do you have a fever, pain , or abdominal swelling? No. Pain Score  0 *  Have you tolerated food without any problems? Yes.    Have you been able to return to your normal activities? Yes.    Do you have any questions about your discharge instructions: Diet   No. Medications  No. Follow up visit  No.  Do you have questions or concerns about your Care? No.  Actions: * If pain score is 4 or above: No action needed, pain <4.

## 2016-02-23 NOTE — Telephone Encounter (Signed)
  Follow up Call-  Call back number 02/22/2016 07/07/2013  Post procedure Call Back phone  # [phone]  Permission to leave phone message Yes Yes  Some recent data might be hidden     No answer, left message.

## 2016-02-25 DIAGNOSIS — M25512 Pain in left shoulder: Secondary | ICD-10-CM | POA: Diagnosis not present

## 2016-03-01 ENCOUNTER — Encounter: Payer: Self-pay | Admitting: Internal Medicine

## 2016-03-01 ENCOUNTER — Telehealth: Payer: Self-pay

## 2016-03-01 NOTE — Telephone Encounter (Signed)
Spoke with pt and he is aware. Pt scheduled for previsit 03/01/16@4pm . Pt scheduled for EGD in the [facility] 03/14/16@10am . Pt aware of appt.

## 2016-03-01 NOTE — Telephone Encounter (Signed)
-----   Message from Irene Shipper, MD sent at 03/01/2016  2:20 PM EDT ----- Regarding: Biopsy results. Follow-up EGD Vaughan Basta, Please let the patient know that the duodenal biopsy did not show carcinoid as previous. This is almost certainly a sampling error (carcinoid typically lies underneath the normal lining of the intestinal tract). As we are considering endoscopic removal, I think it would be best for repeat EGD in the [facility] with repeat biopsies. Please arrange. I sent him a letter as well. Convert phone note. Thank you Dr. Henrene Pastor

## 2016-03-02 LAB — HM COLONOSCOPY

## 2016-03-03 ENCOUNTER — Ambulatory Visit (AMBULATORY_SURGERY_CENTER): Payer: Self-pay | Admitting: *Deleted

## 2016-03-03 VITALS — Ht 71.0 in | Wt 227.8 lb

## 2016-03-03 DIAGNOSIS — D3A00 Benign carcinoid tumor of unspecified site: Secondary | ICD-10-CM

## 2016-03-03 NOTE — Progress Notes (Signed)
No allergies to eggs or soy. No problems with anesthesia.  No oxygen use  No diet drug use  

## 2016-03-06 ENCOUNTER — Encounter: Payer: Self-pay | Admitting: Internal Medicine

## 2016-03-14 ENCOUNTER — Ambulatory Visit (AMBULATORY_SURGERY_CENTER): Payer: Medicare Other | Admitting: Internal Medicine

## 2016-03-14 ENCOUNTER — Encounter: Payer: Self-pay | Admitting: Internal Medicine

## 2016-03-14 VITALS — BP 152/84 | HR 65 | Temp 96.8°F | Resp 17 | Ht 71.0 in | Wt 227.0 lb

## 2016-03-14 DIAGNOSIS — D3A010 Benign carcinoid tumor of the duodenum: Secondary | ICD-10-CM | POA: Diagnosis not present

## 2016-03-14 DIAGNOSIS — D3A00 Benign carcinoid tumor of unspecified site: Secondary | ICD-10-CM

## 2016-03-14 LAB — GLUCOSE, CAPILLARY
GLUCOSE-CAPILLARY: 159 mg/dL — AB (ref 65–99)
GLUCOSE-CAPILLARY: 149 mg/dL — AB (ref 65–99)

## 2016-03-14 LAB — HM COLONOSCOPY

## 2016-03-14 MED ORDER — SODIUM CHLORIDE 0.9 % IV SOLN: 500.0000 mL | INTRAVENOUS | Status: DC

## 2016-03-14 NOTE — Progress Notes (Signed)
Called to room to assist during endoscopic procedure.  Patient ID and intended procedure confirmed with present staff. Received instructions for my participation in the procedure from the performing physician.  

## 2016-03-14 NOTE — Patient Instructions (Signed)
YOU HAD AN ENDOSCOPIC PROCEDURE TODAY AT [facility]:   Refer to the procedure report that was given to you for any specific questions about what was found during the examination.  If the procedure report does not answer your questions, please call your gastroenterologist to clarify.  If you requested that your care partner not be given the details of your procedure findings, then the procedure report has been included in a sealed envelope for you to review at your convenience later.  YOU SHOULD EXPECT: Some feelings of bloating in the abdomen. Passage of more gas than usual.  Walking can help get rid of the air that was put into your GI tract during the procedure and reduce the bloating. If you had a lower endoscopy (such as a colonoscopy or flexible sigmoidoscopy) you may notice spotting of blood in your stool or on the toilet paper. If you underwent a bowel prep for your procedure, you may not have a normal bowel movement for a few days.  Please Note:  You might notice some irritation and congestion in your nose or some drainage.  This is from the oxygen used during your procedure.  There is no need for concern and it should clear up in a day or so.  SYMPTOMS TO REPORT IMMEDIATELY:    Following upper endoscopy (EGD)  Vomiting of blood or coffee ground material  New chest pain or pain under the shoulder blades  Painful or persistently difficult swallowing  New shortness of breath  Fever of 100F or higher  Black, tarry-looking stools  For urgent or emergent issues, a gastroenterologist can be reached at any hour by calling [phone].  Please read all handouts given to you by your recovery Nurse.   DIET:  We do recommend a small meal at first, but then you may proceed to your regular diet.  Drink plenty of fluids but you should avoid alcoholic beverages for 24 hours.  ACTIVITY:  You should plan to take it easy for the rest of today and you should NOT DRIVE or use heavy  machinery until tomorrow (because of the sedation medicines used during the test).    FOLLOW UP: Our staff will call the number listed on your records the next business day following your procedure to check on you and address any questions or concerns that you may have regarding the information given to you following your procedure. If we do not reach you, we will leave a message.  However, if you are feeling well and you are not experiencing any problems, there is no need to return our call.  We will assume that you have returned to your regular daily activities without incident.  If any biopsies were taken you will be contacted by phone or by letter within the next 1-3 weeks.  Please call us at [phone] if you have not heard about the biopsies in 3 weeks.    SIGNATURES/CONFIDENTIALITY: You and/or your care partner have signed paperwork which will be entered into your electronic medical record.  These signatures attest to the fact that that the information above on your After Visit Summary has been reviewed and is understood.  Full responsibility of the confidentiality of this discharge information lies with you and/or your care-partner.  Thank you for letting us take care of your healthcare needs today.

## 2016-03-14 NOTE — Op Note (Signed)
[place] Patient Name: Antonio Adams Procedure Date: 03/14/2016 10:27 AM MRN: [id number] Endoscopist: Docia Chuck. Henrene Pastor , MD Age: 70 Referring MD:  Date of Birth: March 26, 1946 Gender: Male Account #: [id number] Procedure:                Upper GI endoscopy, with biopsies Indications:              Surveillance procedure. Duodenal carcinoid                            previously but last exam with negative biopsies.                            Considering endoscopic removal Medicines:                Monitored Anesthesia Care Procedure:                Pre-Anesthesia Assessment:                           - Prior to the procedure, a History and Physical                            was performed, and patient medications and                            allergies were reviewed. The patient's tolerance of                            previous anesthesia was also reviewed. The risks                            and benefits of the procedure and the sedation                            options and risks were discussed with the patient.                            All questions were answered, and informed consent                            was obtained. Prior Anticoagulants: The patient has                            taken no previous anticoagulant or antiplatelet                            agents. ASA Grade Assessment: III - A patient with                            severe systemic disease. After reviewing the risks                            and benefits, the patient was deemed in  satisfactory condition to undergo the procedure.                           After obtaining informed consent, the endoscope was                            passed under direct vision. Throughout the                            procedure, the patient's blood pressure, pulse, and                            oxygen saturations were monitored continuously. The                            Model  GIF-HQ190 [id number]) scope was introduced                            through the mouth, and advanced to the second part                            of duodenum. The upper GI endoscopy was                            accomplished without difficulty. The patient                            tolerated the procedure well. Scope In: Scope Out: Findings:                 The esophagus was normal.                           The stomach was normal.                           The cardia and gastric fundus were normal on                            retroflexion.                           A single 14 mm submucosal nodule was found in the                            duodenal bulb. Biopsies were taken with a cold                            forceps for histology. The remainder of the                            duodenum was normal. Complications:            No immediate complications. Estimated Blood Loss:     Estimated blood loss: none. Impression:               -  Normal esophagus.                           - Normal stomach.                           - Submucosal nodule found in the duodenum. Biopsied. Recommendation:           - Patient has a contact number available for                            emergencies. The signs and symptoms of potential                            delayed complications were discussed with the                            patient. Return to normal activities tomorrow.                            Written discharge instructions were provided to the                            patient.                           - Resume previous diet.                           - Continue present medications.                           - Await pathology and final recommendations Isys Tietje N. Henrene Pastor, MD 03/14/2016 10:59:12 AM This report has been signed electronically.

## 2016-03-14 NOTE — Progress Notes (Signed)
A and O x3. Report to RN. Tolerated MAC anesthesia well.Teeth unchanged after procedure. 

## 2016-03-15 ENCOUNTER — Telehealth: Payer: Self-pay | Admitting: *Deleted

## 2016-03-15 ENCOUNTER — Telehealth: Payer: Self-pay

## 2016-03-15 NOTE — Telephone Encounter (Signed)
  Follow up Call-  Call back number 03/14/2016 02/22/2016 07/07/2013  Post procedure Call Back phone  # [phone]  Permission to leave phone message Yes Yes Yes  Some recent data might be hidden     Patient questions:  Left message to call us if necessary.

## 2016-03-15 NOTE — Telephone Encounter (Signed)
  Follow up Call-  Call back number 03/14/2016 02/22/2016 07/07/2013  Post procedure Call Back phone  # [phone]  Permission to leave phone message Yes Yes Yes  Some recent data might be hidden     Patient questions:  Do you have a fever, pain , or abdominal swelling? No. Pain Score  0 *  Have you tolerated food without any problems? Yes.    Have you been able to return to your normal activities? Yes.    Do you have any questions about your discharge instructions: Diet   No. Medications  No. Follow up visit  No.  Do you have questions or concerns about your Care? No.  Actions: * If pain score is 4 or above: No action needed, pain <4.

## 2016-03-21 DIAGNOSIS — M25512 Pain in left shoulder: Secondary | ICD-10-CM | POA: Diagnosis not present

## 2016-03-27 DIAGNOSIS — C641 Malignant neoplasm of right kidney, except renal pelvis: Secondary | ICD-10-CM | POA: Diagnosis not present

## 2016-03-28 ENCOUNTER — Ambulatory Visit (HOSPITAL_COMMUNITY)
Admission: RE | Admit: 2016-03-28 | Discharge: 2016-03-28 | Disposition: A | Discharge status: Home / Self Care | Payer: Medicare Other | Source: Ambulatory Visit | Attending: Urology | Admitting: Urology

## 2016-03-28 ENCOUNTER — Other Ambulatory Visit: Payer: Self-pay | Admitting: Urology

## 2016-03-28 DIAGNOSIS — C641 Malignant neoplasm of right kidney, except renal pelvis: Secondary | ICD-10-CM

## 2016-03-31 DIAGNOSIS — M25512 Pain in left shoulder: Secondary | ICD-10-CM | POA: Diagnosis not present

## 2016-04-13 ENCOUNTER — Ambulatory Visit (INDEPENDENT_AMBULATORY_CARE_PROVIDER_SITE_OTHER): Payer: Medicare Other

## 2016-04-13 ENCOUNTER — Encounter (INDEPENDENT_AMBULATORY_CARE_PROVIDER_SITE_OTHER): Payer: Self-pay | Admitting: Specialist

## 2016-04-13 ENCOUNTER — Ambulatory Visit (INDEPENDENT_AMBULATORY_CARE_PROVIDER_SITE_OTHER): Payer: Medicare Other | Admitting: Specialist

## 2016-04-13 VITALS — BP 169/98 | HR 85 | Ht 71.0 in | Wt 222.0 lb

## 2016-04-13 DIAGNOSIS — S83402S Sprain of unspecified collateral ligament of left knee, sequela: Secondary | ICD-10-CM

## 2016-04-13 DIAGNOSIS — M25562 Pain in left knee: Secondary | ICD-10-CM

## 2016-04-13 DIAGNOSIS — M5136 Other intervertebral disc degeneration, lumbar region: Secondary | ICD-10-CM

## 2016-04-13 DIAGNOSIS — G8929 Other chronic pain: Secondary | ICD-10-CM | POA: Diagnosis not present

## 2016-04-13 MED ORDER — TRAMADOL HCL 50 MG PO TABS: 50.0000 mg | ORAL_TABLET | Freq: Four times a day (QID) | ORAL | 0 refills | Status: DC | PRN

## 2016-04-13 NOTE — Patient Instructions (Signed)
Ice for the left knee when painful, heat and stretching exercises in the AM. Ambulate weight bearing as tolerated No arthritis meds due to history of renal disease and its treatment. Ultram for pain. Avoid frequent bending and stooping  No lifting greater than 10 lbs. May use ice or moist heat for pain. Weight loss is of benefit. Handicap license is approved.
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
## 2016-04-13 NOTE — Addendum Note (Signed)
Addended by: Basil Dess on: 04/13/2016 04:04 PM   Modules accepted: Orders

## 2016-04-17 ENCOUNTER — Encounter: Payer: Self-pay | Admitting: Internal Medicine

## 2016-04-18 DIAGNOSIS — K317 Polyp of stomach and duodenum: Secondary | ICD-10-CM | POA: Diagnosis not present

## 2016-04-18 DIAGNOSIS — Z794 Long term (current) use of insulin: Secondary | ICD-10-CM | POA: Diagnosis not present

## 2016-04-18 DIAGNOSIS — E119 Type 2 diabetes mellitus without complications: Secondary | ICD-10-CM | POA: Diagnosis not present

## 2016-04-18 DIAGNOSIS — Z79899 Other long term (current) drug therapy: Secondary | ICD-10-CM | POA: Diagnosis not present

## 2016-04-18 DIAGNOSIS — I10 Essential (primary) hypertension: Secondary | ICD-10-CM | POA: Diagnosis not present

## 2016-04-18 DIAGNOSIS — K3189 Other diseases of stomach and duodenum: Secondary | ICD-10-CM | POA: Diagnosis not present

## 2016-04-18 HISTORY — PX: UPPER ESOPHAGEAL ENDOSCOPIC ULTRASOUND (EUS): SHX6562

## 2016-04-21 DIAGNOSIS — N200 Calculus of kidney: Secondary | ICD-10-CM | POA: Diagnosis not present

## 2016-04-21 DIAGNOSIS — C649 Malignant neoplasm of unspecified kidney, except renal pelvis: Secondary | ICD-10-CM | POA: Diagnosis not present

## 2016-04-21 DIAGNOSIS — C641 Malignant neoplasm of right kidney, except renal pelvis: Secondary | ICD-10-CM | POA: Diagnosis not present

## 2016-04-27 DIAGNOSIS — C61 Malignant neoplasm of prostate: Secondary | ICD-10-CM | POA: Diagnosis not present

## 2016-04-27 DIAGNOSIS — C641 Malignant neoplasm of right kidney, except renal pelvis: Secondary | ICD-10-CM | POA: Diagnosis not present

## 2016-05-24 ENCOUNTER — Other Ambulatory Visit (INDEPENDENT_AMBULATORY_CARE_PROVIDER_SITE_OTHER): Payer: Self-pay | Admitting: Specialist

## 2016-05-24 DIAGNOSIS — S83402S Sprain of unspecified collateral ligament of left knee, sequela: Secondary | ICD-10-CM

## 2016-05-24 DIAGNOSIS — G8929 Other chronic pain: Secondary | ICD-10-CM

## 2016-05-24 DIAGNOSIS — M25562 Pain in left knee: Secondary | ICD-10-CM

## 2016-05-24 DIAGNOSIS — M5136 Other intervertebral disc degeneration, lumbar region: Secondary | ICD-10-CM

## 2016-05-24 MED ORDER — TRAMADOL HCL 50 MG PO TABS: 50.0000 mg | ORAL_TABLET | Freq: Four times a day (QID) | ORAL | 0 refills | Status: DC | PRN

## 2016-05-24 NOTE — Telephone Encounter (Signed)
Pt requesting refill of tramadol. He stated he never got it filled back on 04/13/16 and that he cannot find rx to get it filled now. Wants [place] to write another rx for this. He said his back was in a lot of pain.  Pt number is [phone]  [facility] [address]

## 2016-05-25 NOTE — Telephone Encounter (Signed)
Patient is aware rx is ready for pick up at the front desk 

## 2016-06-20 ENCOUNTER — Other Ambulatory Visit: Payer: Self-pay | Admitting: Internal Medicine

## 2016-06-20 DIAGNOSIS — E118 Type 2 diabetes mellitus with unspecified complications: Secondary | ICD-10-CM

## 2016-06-20 DIAGNOSIS — Z794 Long term (current) use of insulin: Principal | ICD-10-CM

## 2016-06-23 ENCOUNTER — Ambulatory Visit (INDEPENDENT_AMBULATORY_CARE_PROVIDER_SITE_OTHER): Payer: Medicare Other | Admitting: Internal Medicine

## 2016-06-23 ENCOUNTER — Encounter: Payer: Self-pay | Admitting: Internal Medicine

## 2016-06-23 VITALS — BP 150/84 | HR 74 | Ht 71.0 in | Wt 225.0 lb

## 2016-06-23 DIAGNOSIS — D132 Benign neoplasm of duodenum: Secondary | ICD-10-CM

## 2016-06-23 DIAGNOSIS — R109 Unspecified abdominal pain: Secondary | ICD-10-CM | POA: Diagnosis not present

## 2016-06-23 DIAGNOSIS — C641 Malignant neoplasm of right kidney, except renal pelvis: Secondary | ICD-10-CM | POA: Diagnosis not present

## 2016-06-23 NOTE — Progress Notes (Signed)
HISTORY OF PRESENT ILLNESS:  Antonio Adams is a 71 y.o. male with multiple medical problems including diabetes mellitus, hyperlipidemia, sleep apnea, hypertension, prostate cancer status post seed implantation and external beam radiation, radiation proctitis with chronic intermittent rectal bleeding, adenomatous colon polyps, and small duodenal carcinoid which has been followed for some time. Patient also has history of renal cell carcinoma of the right kidney for which she underwent surgery last April. I saw the patient for his most recent surveillance colonoscopy 02/22/2016. This revealed radiation proctitis, 2 diminutive polyps, and internal hemorrhoids. Follow-up in 5 years recommended. Upper endoscopy that day revealed the sub-because lesion. Biopsies did not demonstrate carcinoid this time. Thus, repeat upper endoscopy with biopsies 3 weeks later did confirm low-grade neuroendocrine neoplasm. Given new tech biological options, I referred him to Dr. Stephanie Acre at [facility] performed endoscopic ultrasound (9 mm lesion) followed by banding. The patient went home that day without issue. Follow-up endoscopic ultrasound in 3 months recommended. The patient's chief complaint today is right-sided pain which she attributes to the endoscopic procedure at [facility]. He describes pain as constant and occurring in his right side and right flank. No exacerbating or relieving factors. He brings with him a large volume of disability papers to fill out. No other complaints.  REVIEW OF SYSTEMS:  All non-GI ROS negative except for right is, back pain, itching, depression, urinary leakage, excessive urination, sleeping problems  Past Medical History:  Diagnosis Date  . ADENOCARCINOMA, PROSTATE, GLEASON GRADE 6 12/21/2009   prostate cancer  . ALLERGIC RHINITIS 03/16/2007  . Allergy   . Anxiety   . Arthritis    back   . BACK PAIN WITH RADICULOPATHY 11/23/2008  . Cancer of kidney ([place])    partial right kidney removed  . CARPAL  TUNNEL SYNDROME, BILATERAL 03/16/2007  . Chronic back pain   . Constipation    takes Senokot daily  . Depression    occasionally  . DIABETES MELLITUS, TYPE II 03/16/2007   takes Januvia and MEtformin daily  . GASTROINTESTINAL HEMORRHAGE, HX OF 03/16/2007  . GERD 03/16/2007   takes Omeprazole daily  . Glaucoma    mild - no eye drops  . Hemorrhoids   . History of colon polyps   . HYPERLIPIDEMIA 03/16/2007   takes Crestor daily  . HYPERTENSION 03/16/2007   takes Diltiazem and Lisinopril daily   . LEG CRAMPS 05/22/2007  . Nocturia   . Overactive bladder   . PARESTHESIA 05/21/2007  . Proctitis   . Rectal bleeding    Dr.Norins has explained its from the Radiation that he has received  . Sleep apnea    uses CPAP nightly  . Stomach cancer ([place])   . Urinary frequency    takes Toviaz daily    Past Surgical History:  Procedure Laterality Date  . [place], 2008   repeat surgery; ESI '08  . CARPAL TUNNEL RELEASE     bilateral  . CERVICAL FUSION    . COLONOSCOPY    . colonosocpy    . ESOPHAGOGASTRODUODENOSCOPY     ED  . LUMBAR LAMINECTOMY  10/30/2011   Procedure: MICRODISCECTOMY LUMBAR LAMINECTOMY;  Surgeon: Jessy Oto, MD;  Location: [place];  Service: Orthopedics;  Laterality: Right;  Right L4-5 and L5-S1 Microdiscectomy  . LUMBAR LAMINECTOMY  06/17/2012   Procedure: MICRODISCECTOMY LUMBAR LAMINECTOMY;  Surgeon: Jessy Oto, MD;  Location: [place];  Service: Orthopedics;  Laterality: N/A;  Right L5-S1 microdiscectomy  . POLYPECTOMY    .  PROSTATE SURGERY  march 2012   seed implant  . ROBOTIC ASSITED PARTIAL NEPHRECTOMY Right 09/08/2015   Procedure: XI ROBOTIC ASSITED RIGHT PARTIAL NEPHRECTOMY;  Surgeon: [name], MD;  Location: [facility];  Service: Urology;  Laterality: Right;  Clamp on: 1033 Clamp off: 1103 Total Clamp Time: 30 minutes  . SHOULDER ARTHROSCOPY WITH SUBACROMIAL DECOMPRESSION Left 01/20/2016   Procedure: LEFT SHOULDER ARTHROSCOPY WITH EXTENSIVE   DEBRIDEMENT, ACROMIOPLASTY;  Surgeon: Ninetta Lights, MD;  Location: [place];  Service: Orthopedics;  Laterality: Left;  . SHOULDER SURGERY  LFT  . Stress Cardiolite  12/24/2003   negative for ischemia  . UPPER ESOPHAGEAL ENDOSCOPIC ULTRASOUND (EUS)  04/18/2016   [facility] hospital  . UPPER GASTROINTESTINAL ENDOSCOPY    . WOUND EXPLORATION     management of a wound that he sustained in the [place]  reports that he has never smoked. He has never used smokeless tobacco. He reports that he drinks alcohol. He reports that he does not use drugs.  family history includes Cancer (age of onset: 41) in his sister; Hypertension in his other.  Allergies  Allergen Reactions  . Ace Inhibitors Swelling       PHYSICAL EXAMINATION: Vital signs: BP (!) 150/84   Pulse 74   Ht 5\' 11"  (1.803 m)   Wt 225 lb (102.1 kg)   BMI 31.38 kg/m   Constitutional: generally well-appearing, no acute distress Psychiatric: alert and oriented x3, cooperative Eyes: extraocular movements intact, anicteric, conjunctiva pink Mouth: oral pharynx moist, no lesions Neck: supple no lymphadenopathy Cardiovascular: heart regular rate and rhythm, no murmur Lungs: clear to auscultation bilaterally Abdomen: soft,Obese, nontender, nondistended, no obvious ascites, no peritoneal signs, normal bowel sounds, no organomegaly Rectal:Omitted Extremities: no clubbing cyanosis or lower extremity edema bilaterally Skin: no lesions on visible extremities Neuro: No focal deficits. Cranial nerves intact  ASSESSMENT:  #1. Right sided/right flank pain. This is not related to the removal of his diminutive carcinoid lesion  #2. History of right-sided renal cell carcinoma last April. Rule out process in that region to explain pain  #3. History of radiation proctitis  #4. History of adenomatous colon polyps  #5. Diminutive duodenal carcinoid status post ultrasound and banding at [facility]  04/18/2016   PLAN:   #1. We will set up MRI of the abdomen and pelvis. We will contact him with the results   #2. Told follow-up with his urologist regarding history of kidney cancer and right flank pain   #3. Follow-up with [facility] as planned regarding diminutive duodenal carcinoid   #4. Advised his pain was not related to the duodenal carcinoid  #5. Advised that there is no indication of a GI standpoint to proceed with this ability paperwork. I could not speak to other specialists. He understood  #6. Surveillance colonoscopy in 5 years

## 2016-06-23 NOTE — Patient Instructions (Signed)
You will receive a call from [facility] to schedule your MRI.    

## 2016-06-26 ENCOUNTER — Other Ambulatory Visit: Payer: Self-pay | Admitting: Internal Medicine

## 2016-06-26 DIAGNOSIS — E118 Type 2 diabetes mellitus with unspecified complications: Secondary | ICD-10-CM

## 2016-06-26 DIAGNOSIS — Z794 Long term (current) use of insulin: Principal | ICD-10-CM

## 2016-06-29 DIAGNOSIS — R972 Elevated prostate specific antigen [PSA]: Secondary | ICD-10-CM | POA: Diagnosis not present

## 2016-07-04 DIAGNOSIS — N3281 Overactive bladder: Secondary | ICD-10-CM | POA: Diagnosis not present

## 2016-07-04 DIAGNOSIS — Z85528 Personal history of other malignant neoplasm of kidney: Secondary | ICD-10-CM | POA: Diagnosis not present

## 2016-07-07 ENCOUNTER — Ambulatory Visit
Admission: RE | Admit: 2016-07-07 | Discharge: 2016-07-07 | Disposition: A | Discharge status: Home / Self Care | Payer: Medicare Other | Source: Ambulatory Visit | Attending: Internal Medicine | Admitting: Internal Medicine

## 2016-07-07 DIAGNOSIS — R109 Unspecified abdominal pain: Secondary | ICD-10-CM | POA: Diagnosis not present

## 2016-07-07 MED ORDER — GADOBENATE DIMEGLUMINE 529 MG/ML IV SOLN
20.0000 mL | Freq: Once | INTRAVENOUS | Status: AC | PRN
  Administered 2016-07-07: 20 mL via INTRAVENOUS

## 2016-07-14 ENCOUNTER — Telehealth: Payer: Self-pay | Admitting: Internal Medicine

## 2016-07-14 NOTE — Telephone Encounter (Signed)
Antonio Adams pt calling for his MRI results, MRI was done 07/07/16. Pt calling for MRI results. See note from last OV:  PLAN:   #1. We will set up MRI of the abdomen and pelvis. We will contact him with the results   #2. Told follow-up with his urologist regarding history of kidney cancer and right flank pain   #3. Follow-up with [facility] as planned regarding diminutive duodenal carcinoid   #4. Advised his pain was not related to the duodenal carcinoid  #5. Advised that there is no indication of a GI standpoint to proceed with this ability paperwork. I could not speak to other specialists. He understood  #6. Surveillance colonoscopy in 5 years  Dr. Havery Moros as doc of the day please advise.

## 2016-07-14 NOTE — Telephone Encounter (Signed)
MRI reviewed. There is no evidence of recurrent renal cell carcinoma. No gallstones. No specific pathology that would cause his symptoms. Unclear if symptoms may be musculoskeletal. Can you ask the patient if his symptoms persist? Thanks

## 2016-07-17 NOTE — Telephone Encounter (Signed)
See result note.  

## 2016-07-19 ENCOUNTER — Other Ambulatory Visit: Payer: Self-pay | Admitting: Internal Medicine

## 2016-07-19 DIAGNOSIS — Z794 Long term (current) use of insulin: Principal | ICD-10-CM

## 2016-07-19 DIAGNOSIS — E118 Type 2 diabetes mellitus with unspecified complications: Secondary | ICD-10-CM

## 2016-07-20 ENCOUNTER — Telehealth: Payer: Self-pay | Admitting: Internal Medicine

## 2016-07-20 ENCOUNTER — Other Ambulatory Visit: Payer: Self-pay | Admitting: Internal Medicine

## 2016-07-20 DIAGNOSIS — D510 Vitamin B12 deficiency anemia due to intrinsic factor deficiency: Secondary | ICD-10-CM

## 2016-07-20 DIAGNOSIS — Z794 Long term (current) use of insulin: Secondary | ICD-10-CM

## 2016-07-20 DIAGNOSIS — E785 Hyperlipidemia, unspecified: Secondary | ICD-10-CM

## 2016-07-20 DIAGNOSIS — E118 Type 2 diabetes mellitus with unspecified complications: Secondary | ICD-10-CM

## 2016-07-20 DIAGNOSIS — I10 Essential (primary) hypertension: Secondary | ICD-10-CM

## 2016-07-20 NOTE — Telephone Encounter (Signed)
Labs ordered.

## 2016-07-20 NOTE — Telephone Encounter (Signed)
Patient has an appointment on March 15 for a CPE at 115pm. He would like to do his blood work before the appointment. Can those orders be put in? Thank you.

## 2016-07-21 NOTE — Telephone Encounter (Signed)
Patient informed. 

## 2016-07-25 ENCOUNTER — Other Ambulatory Visit (INDEPENDENT_AMBULATORY_CARE_PROVIDER_SITE_OTHER): Payer: Medicare Other

## 2016-07-25 ENCOUNTER — Encounter: Payer: Self-pay | Admitting: Internal Medicine

## 2016-07-25 DIAGNOSIS — I10 Essential (primary) hypertension: Secondary | ICD-10-CM

## 2016-07-25 DIAGNOSIS — E785 Hyperlipidemia, unspecified: Secondary | ICD-10-CM

## 2016-07-25 DIAGNOSIS — E118 Type 2 diabetes mellitus with unspecified complications: Secondary | ICD-10-CM

## 2016-07-25 DIAGNOSIS — Z794 Long term (current) use of insulin: Secondary | ICD-10-CM | POA: Diagnosis not present

## 2016-07-25 DIAGNOSIS — D510 Vitamin B12 deficiency anemia due to intrinsic factor deficiency: Secondary | ICD-10-CM | POA: Diagnosis not present

## 2016-07-25 LAB — CBC WITH DIFFERENTIAL/PLATELET
BASOS ABS: 0.1 10*3/uL (ref 0.0–0.1)
Basophils Relative: 1.2 % (ref 0.0–3.0)
Eosinophils Absolute: 0.5 10*3/uL (ref 0.0–0.7)
Eosinophils Relative: 10.3 % — ABNORMAL HIGH (ref 0.0–5.0)
HCT: 36.1 % — ABNORMAL LOW (ref 39.0–52.0)
Hemoglobin: 12.3 g/dL — ABNORMAL LOW (ref 13.0–17.0)
LYMPHS ABS: 1.3 10*3/uL (ref 0.7–4.0)
Lymphocytes Relative: 26.9 % (ref 12.0–46.0)
MCHC: 34.1 g/dL (ref 30.0–36.0)
MCV: 85.7 fl (ref 78.0–100.0)
MONO ABS: 0.6 10*3/uL (ref 0.1–1.0)
Monocytes Relative: 12.1 % — ABNORMAL HIGH (ref 3.0–12.0)
NEUTROS ABS: 2.3 10*3/uL (ref 1.4–7.7)
NEUTROS PCT: 49.5 % (ref 43.0–77.0)
PLATELETS: 220 10*3/uL (ref 150.0–400.0)
RBC: 4.22 Mil/uL (ref 4.22–5.81)
RDW: 14.6 % (ref 11.5–15.5)
WBC: 4.7 10*3/uL (ref 4.0–10.5)

## 2016-07-25 LAB — HEMOGLOBIN A1C: Hgb A1c MFr Bld: 9.6 % — ABNORMAL HIGH (ref 4.6–6.5)

## 2016-07-25 LAB — LIPID PANEL
CHOL/HDL RATIO: 6
Cholesterol: 159 mg/dL (ref 0–200)
HDL: 28.3 mg/dL — ABNORMAL LOW (ref >39.00)
LDL Cholesterol: 114 mg/dL — ABNORMAL HIGH (ref 0–99)
NONHDL: 130.6
Triglycerides: 84 mg/dL (ref 0.0–149.0)
VLDL: 16.8 mg/dL (ref 0.0–40.0)

## 2016-07-25 LAB — URINALYSIS, ROUTINE W REFLEX MICROSCOPIC
Bilirubin Urine: NEGATIVE
Hgb urine dipstick: NEGATIVE
Ketones, ur: NEGATIVE
LEUKOCYTES UA: NEGATIVE
NITRITE: NEGATIVE
PH: 6 (ref 5.0–8.0)
Specific Gravity, Urine: 1.025 (ref 1.000–1.030)
TOTAL PROTEIN, URINE-UPE24: 100 — AB
URINE GLUCOSE: NEGATIVE
Urobilinogen, UA: 0.2 (ref 0.0–1.0)

## 2016-07-25 LAB — COMPREHENSIVE METABOLIC PANEL
ALT: 15 U/L (ref 0–53)
AST: 14 U/L (ref 0–37)
Albumin: 4.2 g/dL (ref 3.5–5.2)
Alkaline Phosphatase: 94 U/L (ref 39–117)
BUN: 21 mg/dL (ref 6–23)
CO2: 25 meq/L (ref 19–32)
CREATININE: 1.62 mg/dL — AB (ref 0.40–1.50)
Calcium: 10.5 mg/dL (ref 8.4–10.5)
Chloride: 102 mEq/L (ref 96–112)
GFR: 54.4 mL/min — ABNORMAL LOW (ref >60.00)
GLUCOSE: 190 mg/dL — AB (ref 70–99)
Potassium: 4.3 mEq/L (ref 3.5–5.1)
Sodium: 136 mEq/L (ref 135–145)
TOTAL PROTEIN: 7.3 g/dL (ref 6.0–8.3)
Total Bilirubin: 0.3 mg/dL (ref 0.2–1.2)

## 2016-07-25 LAB — MICROALBUMIN / CREATININE URINE RATIO
CREATININE, U: 175.3 mg/dL
MICROALB/CREAT RATIO: 67.3 mg/g — AB (ref 0.0–30.0)
Microalb, Ur: 118 mg/dL — ABNORMAL HIGH (ref 0.0–1.9)

## 2016-07-27 ENCOUNTER — Ambulatory Visit (INDEPENDENT_AMBULATORY_CARE_PROVIDER_SITE_OTHER): Payer: Medicare Other | Admitting: Internal Medicine

## 2016-07-27 ENCOUNTER — Encounter: Payer: Self-pay | Admitting: Internal Medicine

## 2016-07-27 ENCOUNTER — Other Ambulatory Visit (INDEPENDENT_AMBULATORY_CARE_PROVIDER_SITE_OTHER): Payer: Medicare Other

## 2016-07-27 VITALS — BP 148/78 | HR 84 | Temp 98.3°F | Resp 16 | Ht 71.0 in | Wt 212.1 lb

## 2016-07-27 DIAGNOSIS — Z0000 Encounter for general adult medical examination without abnormal findings: Secondary | ICD-10-CM | POA: Diagnosis not present

## 2016-07-27 DIAGNOSIS — E1129 Type 2 diabetes mellitus with other diabetic kidney complication: Secondary | ICD-10-CM | POA: Diagnosis not present

## 2016-07-27 DIAGNOSIS — D539 Nutritional anemia, unspecified: Secondary | ICD-10-CM | POA: Diagnosis not present

## 2016-07-27 DIAGNOSIS — R809 Proteinuria, unspecified: Secondary | ICD-10-CM | POA: Diagnosis not present

## 2016-07-27 DIAGNOSIS — I10 Essential (primary) hypertension: Secondary | ICD-10-CM

## 2016-07-27 DIAGNOSIS — Z794 Long term (current) use of insulin: Secondary | ICD-10-CM

## 2016-07-27 DIAGNOSIS — E118 Type 2 diabetes mellitus with unspecified complications: Secondary | ICD-10-CM

## 2016-07-27 LAB — CBC WITH DIFFERENTIAL/PLATELET
BASOS ABS: 0 10*3/uL (ref 0.0–0.1)
Basophils Relative: 1 % (ref 0.0–3.0)
Eosinophils Absolute: 0.3 10*3/uL (ref 0.0–0.7)
Eosinophils Relative: 6.7 % — ABNORMAL HIGH (ref 0.0–5.0)
HEMATOCRIT: 35.1 % — AB (ref 39.0–52.0)
HEMOGLOBIN: 11.9 g/dL — AB (ref 13.0–17.0)
LYMPHS PCT: 26.9 % (ref 12.0–46.0)
Lymphs Abs: 1.1 10*3/uL (ref 0.7–4.0)
MCHC: 34 g/dL (ref 30.0–36.0)
MCV: 85.1 fl (ref 78.0–100.0)
Monocytes Absolute: 0.5 10*3/uL (ref 0.1–1.0)
Monocytes Relative: 11 % (ref 3.0–12.0)
NEUTROS ABS: 2.2 10*3/uL (ref 1.4–7.7)
NEUTROS PCT: 54.4 % (ref 43.0–77.0)
PLATELETS: 209 10*3/uL (ref 150.0–400.0)
RBC: 4.12 Mil/uL — ABNORMAL LOW (ref 4.22–5.81)
RDW: 15 % (ref 11.5–15.5)
WBC: 4.1 10*3/uL (ref 4.0–10.5)

## 2016-07-27 LAB — IBC PANEL
IRON: 80 ug/dL (ref 42–165)
SATURATION RATIOS: 19.8 % — AB (ref 20.0–50.0)
Transferrin: 289 mg/dL (ref 212.0–360.0)

## 2016-07-27 LAB — VITAMIN B12: Vitamin B-12: >1500 pg/mL — ABNORMAL HIGH (ref 211–911)

## 2016-07-27 LAB — FOLATE: Folate: >23.5 ng/mL (ref >5.9)

## 2016-07-27 LAB — FERRITIN: Ferritin: 38.1 ng/mL (ref 22.0–322.0)

## 2016-07-27 MED ORDER — TELMISARTAN 40 MG PO TABS: 40.0000 mg | ORAL_TABLET | Freq: Every day | ORAL | 1 refills | Status: DC

## 2016-07-27 MED ORDER — INSULIN GLARGINE 100 UNIT/ML SOLOSTAR PEN: 50.0000 [IU] | PEN_INJECTOR | SUBCUTANEOUS | 3 refills | Status: DC

## 2016-07-27 NOTE — Progress Notes (Addendum)
Subjective:  Patient ID: Antonio Adams, male    DOB: 1945/11/05  Age: 71 y.o. MRN: [id number]  CC: Annual Exam; Hypertension; Hyperlipidemia; Diabetes; and Anemia   HPI Antonio Adams presents for an AWV/CPX.  He complains of chronic numbness in both feet as well as chronic polyuria and hematuria. He tells me he saw his urologist a couple months ago and an ultrasound was done and his PSA was okay. He also had some right flank pain at the time and was told that nothing abnormal was found. He doesn't think his blood sugars are well-controlled since he complains of polyuria and weight gain.  Past Medical History:  Diagnosis Date  . ADENOCARCINOMA, PROSTATE, GLEASON GRADE 6 12/21/2009   prostate cancer  . ALLERGIC RHINITIS 03/16/2007  . Allergy   . Anxiety   . Arthritis    back   . BACK PAIN WITH RADICULOPATHY 11/23/2008  . Cancer of kidney ([place])    partial right kidney removed  . CARPAL TUNNEL SYNDROME, BILATERAL 03/16/2007  . Chronic back pain   . Constipation    takes Senokot daily  . Depression    occasionally  . DIABETES MELLITUS, TYPE II 03/16/2007   takes Januvia and MEtformin daily  . GASTROINTESTINAL HEMORRHAGE, HX OF 03/16/2007  . GERD 03/16/2007   takes Omeprazole daily  . Glaucoma    mild - no eye drops  . Hemorrhoids   . History of colon polyps   . HYPERLIPIDEMIA 03/16/2007   takes Crestor daily  . HYPERTENSION 03/16/2007   takes Diltiazem and Lisinopril daily   . LEG CRAMPS 05/22/2007  . Nocturia   . Overactive bladder   . PARESTHESIA 05/21/2007  . Proctitis   . Rectal bleeding    Dr.Norins has explained its from the Radiation that he has received  . Sleep apnea    uses CPAP nightly  . Stomach cancer ([place])   . Urinary frequency    takes Toviaz daily   Past Surgical History:  Procedure Laterality Date  . [place], 2008   repeat surgery; ESI '08  . CARPAL TUNNEL RELEASE     bilateral  . CERVICAL FUSION    . COLONOSCOPY    . colonosocpy     . ESOPHAGOGASTRODUODENOSCOPY     ED  . LUMBAR LAMINECTOMY  10/30/2011   Procedure: MICRODISCECTOMY LUMBAR LAMINECTOMY;  Surgeon: Jessy Oto, MD;  Location: [facility];  Service: Orthopedics;  Laterality: Right;  Right L4-5 and L5-S1 Microdiscectomy  . LUMBAR LAMINECTOMY  06/17/2012   Procedure: MICRODISCECTOMY LUMBAR LAMINECTOMY;  Surgeon: Jessy Oto, MD;  Location: [facility];  Service: Orthopedics;  Laterality: N/A;  Right L5-S1 microdiscectomy  . POLYPECTOMY    . PROSTATE SURGERY  march 2012   seed implant  . ROBOTIC ASSITED PARTIAL NEPHRECTOMY Right 09/08/2015   Procedure: XI ROBOTIC ASSITED RIGHT PARTIAL NEPHRECTOMY;  Surgeon: [name], MD;  Location: [facility];  Service: Urology;  Laterality: Right;  Clamp on: 1033 Clamp off: 1103 Total Clamp Time: 30 minutes  . SHOULDER ARTHROSCOPY WITH SUBACROMIAL DECOMPRESSION Left 01/20/2016   Procedure: LEFT SHOULDER ARTHROSCOPY WITH EXTENSIVE  DEBRIDEMENT, ACROMIOPLASTY;  Surgeon: Ninetta Lights, MD;  Location: [place];  Service: Orthopedics;  Laterality: Left;  . SHOULDER SURGERY  LFT  . Stress Cardiolite  12/24/2003   negative for ischemia  . UPPER ESOPHAGEAL ENDOSCOPIC ULTRASOUND (EUS)  04/18/2016   [facility] hospital  . UPPER GASTROINTESTINAL ENDOSCOPY    . WOUND  EXPLORATION     management of a wound that he sustained in the [place]    reports that he has never smoked. He has never used smokeless tobacco. He reports that he drinks alcohol. He reports that he does not use drugs. family history includes Cancer (age of onset: 39) in his sister; Hypertension in his other. Allergies  Allergen Reactions  . Ace Inhibitors Swelling    Outpatient Medications Prior to Visit  Medication Sig Dispense Refill  . aspirin EC 81 MG tablet Take 81 mg by mouth daily.    Marland Kitchen atorvastatin (LIPITOR) 20 MG tablet Take 1 tablet (20 mg total) by mouth daily. (Patient taking differently: Take 20 mg by mouth at bedtime. ) 90 tablet 3  .  cholecalciferol (VITAMIN D) 1000 units tablet Take 1,000 Units by mouth daily.    . clotrimazole-betamethasone (LOTRISONE) cream APPLY 1 APPLICATION TOPICALLY 2 (TWO) TIMES DAILY. 30 g 0  . diltiazem (DILACOR XR) 120 MG 24 hr capsule Take 120 mg by mouth daily.      . ferrous sulfate (FERROUSUL) 325 (65 FE) MG tablet Take 1 tablet (325 mg total) by mouth 2 (two) times daily. 60 tablet 11  . gabapentin (NEURONTIN) 300 MG capsule Take 300 mg by mouth at bedtime.    Marland Kitchen gemfibrozil (LOPID) 600 MG tablet Take 600 mg by mouth 2 (two) times daily.     Marland Kitchen glucose blood (ONE TOUCH ULTRA TEST) test strip 1 each by Other route 2 (two) times daily. And lancets 2/day 250.03 100 each 12  . JANUMET XR 50-1000 MG TB24 TAKE 1 TABLET BY MOUTH DAILY. 30 tablet 10  . Needles & Syringes MISC Use syringe and needle to inject insulin into the skin as directed on insulin prescription. DX E11.09 90 each 3  . NOVOFINE PLUS 32G X 4 MM MISC USE DAILY WITH INSULIN 100 each 2  . NOVOFINE PLUS 32G X 4 MM MISC USE DAILY WITH INSULIN 100 each 2  . omeprazole (PRILOSEC) 40 MG capsule TAKE ONE CAPSULE EVERY DAY 30 capsule 10  . sertraline (ZOLOFT) 100 MG tablet Take 1 tablet by mouth daily. Reported on 08/06/2015    . tamsulosin (FLOMAX) 0.4 MG CAPS capsule TAKE 1 CAPSULE (0.4 MG TOTAL) BY MOUTH DAILY. 30 capsule 1  . zolpidem (AMBIEN) 10 MG tablet Take 10 mg by mouth at bedtime as needed for sleep.     . citalopram (CELEXA) 10 MG tablet Take 10 mg by mouth daily.     . clonazePAM (KLONOPIN) 2 MG tablet Take 2 mg by mouth daily. Reported on 08/06/2015    . Insulin Glargine (LANTUS SOLOSTAR) 100 UNIT/ML Solostar Pen Inject 15 Units into the skin every morning. (Patient taking differently: Inject 15 Units into the skin daily at 10 pm. ) 15 mL 3  . ondansetron (ZOFRAN) 4 MG tablet Take 1 tablet (4 mg total) by mouth every 8 (eight) hours as needed for nausea or vomiting. 20 tablet 0  . traMADol (ULTRAM) 50 MG tablet Take 1 tablet (50 mg  total) by mouth every 6 (six) hours as needed. 60 tablet 0  . Multiple Vitamin (MULTIVITAMIN WITH MINERALS) TABS tablet Take 1 tablet by mouth daily.    Marland Kitchen oxyCODONE-acetaminophen (ROXICET) 5-325 MG tablet Take 1-2 tablets by mouth every 4 (four) hours as needed for severe pain. (Patient not taking: Reported on 07/27/2016) 50 tablet 0  . 0.9 %  sodium chloride infusion     . 0.9 %  sodium chloride  infusion      No facility-administered medications prior to visit.     ROS Review of Systems  Constitutional: Negative.  Negative for activity change, appetite change, chills, diaphoresis, fatigue and unexpected weight change.  HENT: Negative.  Negative for trouble swallowing.   Eyes: Negative for visual disturbance.  Respiratory: Negative for cough, chest tightness and shortness of breath.   Cardiovascular: Negative for chest pain, palpitations and leg swelling.  Gastrointestinal: Negative for abdominal pain, blood in stool, constipation, diarrhea, nausea and vomiting.  Endocrine: Positive for polyuria. Negative for cold intolerance, heat intolerance, polydipsia and polyphagia.  Genitourinary: Positive for flank pain and hematuria. Negative for difficulty urinating, frequency, scrotal swelling, testicular pain and urgency.  Skin: Negative.   Allergic/Immunologic: Negative.   Neurological: Negative.  Negative for dizziness.  Hematological: Negative.  Negative for adenopathy. Does not bruise/bleed easily.    Objective:  BP (!) 148/78 (BP Location: Left Arm, Patient Position: Sitting, Cuff Size: Large)   Pulse 84   Temp 98.3 F (36.8 C) (Oral)   Resp 16   Ht 5\' 11"  (1.803 m)   Wt 212 lb 1.3 oz (96.2 kg)   SpO2 95%   BMI 29.58 kg/m   BP Readings from Last 3 Encounters:  07/27/16 (!) 148/78  06/23/16 (!) 150/84  04/13/16 (!) 169/98    Wt Readings from Last 3 Encounters:  07/27/16 212 lb 1.3 oz (96.2 kg)  06/23/16 225 lb (102.1 kg)  04/13/16 222 lb (100.7 kg)    Physical Exam    Constitutional: He is oriented to person, place, and time. No distress.  HENT:  Mouth/Throat: Oropharynx is clear and moist. No oropharyngeal exudate.  Eyes: Conjunctivae are normal. Right eye exhibits no discharge. Left eye exhibits no discharge. No scleral icterus.  Neck: Normal range of motion. Neck supple. No JVD present. No tracheal deviation present. No thyromegaly present.  Cardiovascular: Normal rate, regular rhythm, normal heart sounds and intact distal pulses.  Exam reveals no gallop and no friction rub.   No murmur heard. Pulmonary/Chest: Effort normal and breath sounds normal. No stridor. No respiratory distress. He has no wheezes. He has no rales. He exhibits no tenderness.  Abdominal: Soft. Bowel sounds are normal. He exhibits no distension and no mass. There is no tenderness. There is no rebound and no guarding.  Genitourinary:  Genitourinary Comments: GU and rectal exams were deferred at his request since he is being followed closely by GU and GI  Musculoskeletal: Normal range of motion. He exhibits no edema, tenderness or deformity.  Lymphadenopathy:    He has no cervical adenopathy.  Neurological: He is oriented to person, place, and time.  Skin: Skin is warm and dry. No rash noted. He is not diaphoretic. No erythema. No pallor.  Psychiatric: He has a normal mood and affect. His behavior is normal. Judgment and thought content normal.  Vitals reviewed.   Lab Results  Component Value Date   WBC 4.1 07/27/2016   HGB 11.9 (L) 07/27/2016   HCT 35.1 (L) 07/27/2016   PLT 209.0 07/27/2016   GLUCOSE 190 (H) 07/25/2016   CHOL 159 07/25/2016   TRIG 84.0 07/25/2016   HDL 28.30 (L) 07/25/2016   LDLCALC 114 (H) 07/25/2016   ALT 15 07/25/2016   AST 14 07/25/2016   NA 136 07/25/2016   K 4.3 07/25/2016   CL 102 07/25/2016   CREATININE 1.62 (H) 07/25/2016   BUN 21 07/25/2016   CO2 25 07/25/2016   TSH 0.67 06/14/2015  PSA 0.09 (L) 12/10/2013   INR 1.02 07/14/2010   HGBA1C  9.6 (H) 07/25/2016   MICROALBUR 118.0 (H) 07/25/2016    Mr Abdomen Wwo Contrast  Result Date: 07/07/2016 CLINICAL DATA:  Right flank pain EXAM: MRI ABDOMEN WITHOUT AND WITH CONTRAST TECHNIQUE: Multiplanar multisequence MR imaging of the abdomen was performed both before and after the administration of intravenous contrast. CONTRAST:  89mL MULTIHANCE GADOBENATE DIMEGLUMINE 529 MG/ML IV SOLN COMPARISON:  04/21/2016 FINDINGS: Lower chest: The lung bases are clear. No pleural or pericardial effusion. Hepatobiliary: No focal liver abnormality. The gallbladder appears normal. No biliary dilatation. Pancreas: Previously noted gallstones are not visualized on today's study. No biliary ductal dilatation. Spleen:  The spleen is normal. Adrenals/Urinary Tract: The adrenal glands are unremarkable. Postoperative changes from right upper pole partial nephrectomy are identified and appears similar to previous exam. Right upper pole cyst is noted measuring 2.3 cm, image 18 of series 8. Small left kidney cysts. Stomach/Bowel: The stomach is normal. The visualized upper abdominal bowel loops are unremarkable. Vascular/Lymphatic: No pathologically enlarged lymph nodes identified. No abdominal aortic aneurysm demonstrated. Other:  None. Musculoskeletal: No suspicious bone lesions identified. IMPRESSION: 1. No acute findings identified within the abdomen. 2. Previously noted gallstones are no longer visualized and may have passed. No biliary dilatation identified at this time and no evidence for choledocholithiasis. 3. Stable postsurgical changes of partial nephrectomy from the upper pole of right kidney. Electronically Signed   By: Kerby Moors M.D.   On: 07/07/2016 11:16    Assessment & Plan:   Zekiah was seen today for annual exam, hypertension, hyperlipidemia, diabetes and anemia.  Diagnoses and all orders for this visit:  Deficiency anemia- his vitamin levels are normal and his H&H is stable, this appears to be  the anemia of chronic disease. -     CBC with Differential/Platelet; Future -     IBC panel; Future -     Folate; Future -     Ferritin; Future -     Vitamin B12; Future  Microalbuminuria due to type 2 diabetes mellitus ([place])- will start an ARB to prevent progression of this -     telmisartan (MICARDIS) 40 MG tablet; Take 1 tablet (40 mg total) by mouth daily.  Type 2 diabetes mellitus with complication, with long-term current use of insulin ([place])- his blood sugars are not adequately well controlled so I asked him to increase his Lantus dose from 15 units a day to 50 units a day. -     Insulin Glargine (LANTUS SOLOSTAR) 100 UNIT/ML Solostar Pen; Inject 50 Units into the skin every morning. -     telmisartan (MICARDIS) 40 MG tablet; Take 1 tablet (40 mg total) by mouth daily.  Essential hypertension- his blood pressure is not adequately well controlled, electrolytes and renal function are stable, will start an ARB. -     telmisartan (MICARDIS) 40 MG tablet; Take 1 tablet (40 mg total) by mouth daily.   I have discontinued Mr. Broz clonazePAM, multivitamin with minerals, citalopram, ondansetron, oxyCODONE-acetaminophen, and traMADol. I have also changed his Insulin Glargine. Additionally, I am having him start on telmisartan. Lastly, I am having him maintain his diltiazem, gemfibrozil, zolpidem, sertraline, omeprazole, atorvastatin, tamsulosin, glucose blood, Needles & Syringes, gabapentin, aspirin EC, JANUMET XR, clotrimazole-betamethasone, cholecalciferol, ferrous sulfate, NOVOFINE PLUS, and NOVOFINE PLUS. We will stop administering sodium chloride and sodium chloride.  Meds ordered this encounter  Medications  . Insulin Glargine (LANTUS SOLOSTAR) 100 UNIT/ML Solostar Pen  Sig: Inject 50 Units into the skin every morning.    Dispense:  15 mL    Refill:  3  . telmisartan (MICARDIS) 40 MG tablet    Sig: Take 1 tablet (40 mg total) by mouth daily.    Dispense:  90 tablet    Refill:   1   See AVS for instructions about healthy living and anticipatory guidance.  Follow-up: Return in about 3 months (around 10/27/2016).  Scarlette Calico, MD

## 2016-07-27 NOTE — Progress Notes (Signed)
Pre visit review using our clinic review tool, if applicable. No additional management support is needed unless otherwise documented below in the visit note. 

## 2016-07-27 NOTE — Patient Instructions (Signed)
 Health Maintenance, Male A healthy lifestyle and preventive care is important for your health and wellness. Ask your health care provider about what schedule of regular examinations is right for you. What should I know about weight and diet?  Eat a Healthy Diet  Eat plenty of vegetables, fruits, whole grains, low-fat dairy products, and lean protein.  Do not eat a lot of foods high in solid fats, added sugars, or salt. Maintain a Healthy Weight  Regular exercise can help you achieve or maintain a healthy weight. You should:  Do at least 150 minutes of exercise each week. The exercise should increase your heart rate and make you sweat (moderate-intensity exercise).  Do strength-training exercises at least twice a week. Watch Your Levels of Cholesterol and Blood Lipids  Have your blood tested for lipids and cholesterol every 5 years starting at 71 years of age. If you are at high risk for heart disease, you should start having your blood tested when you are 71 years old. You may need to have your cholesterol levels checked more often if:  Your lipid or cholesterol levels are high.  You are older than 71 years of age.  You are at high risk for heart disease. What should I know about cancer screening? Many types of cancers can be detected early and may often be prevented. Lung Cancer  You should be screened every year for lung cancer if:  You are a current smoker who has smoked for at least 30 years.  You are a former smoker who has quit within the past 15 years.  Talk to your health care provider about your screening options, when you should start screening, and how often you should be screened. Colorectal Cancer  Routine colorectal cancer screening usually begins at 71 years of age and should be repeated every 5-10 years until you are 71 years old. You may need to be screened more often if early forms of precancerous polyps or small growths are found. Your health care provider  may recommend screening at an earlier age if you have risk factors for colon cancer.  Your health care provider may recommend using home test kits to check for hidden blood in the stool.  A small camera at the end of a tube can be used to examine your colon (sigmoidoscopy or colonoscopy). This checks for the earliest forms of colorectal cancer. Prostate and Testicular Cancer  Depending on your age and overall health, your health care provider may do certain tests to screen for prostate and testicular cancer.  Talk to your health care provider about any symptoms or concerns you have about testicular or prostate cancer. Skin Cancer  Check your skin from head to toe regularly.  Tell your health care provider about any new moles or changes in moles, especially if:  There is a change in a mole's size, shape, or color.  You have a mole that is larger than a pencil eraser.  Always use sunscreen. Apply sunscreen liberally and repeat throughout the day.  Protect yourself by wearing long sleeves, pants, a wide-brimmed hat, and sunglasses when outside. What should I know about heart disease, diabetes, and high blood pressure?  If you are 18-39 years of age, have your blood pressure checked every 3-5 years. If you are 40 years of age or older, have your blood pressure checked every year. You should have your blood pressure measured twice-once when you are at a hospital or clinic, and once when you are not at   a hospital or clinic. Record the average of the two measurements. To check your blood pressure when you are not at a hospital or clinic, you can use:  An automated blood pressure machine at a pharmacy.  A home blood pressure monitor.  Talk to your health care provider about your target blood pressure.  If you are between 45-79 years old, ask your health care provider if you should take aspirin to prevent heart disease.  Have regular diabetes screenings by checking your fasting blood sugar  level.  If you are at a normal weight and have a low risk for diabetes, have this test once every three years after the age of 45.  If you are overweight and have a high risk for diabetes, consider being tested at a younger age or more often.  A one-time screening for abdominal aortic aneurysm (AAA) by ultrasound is recommended for men aged 65-75 years who are current or former smokers. What should I know about preventing infection? Hepatitis B  If you have a higher risk for hepatitis B, you should be screened for this virus. Talk with your health care provider to find out if you are at risk for hepatitis B infection. Hepatitis C  Blood testing is recommended for:  Everyone born from 1945 through 1965.  Anyone with known risk factors for hepatitis C. Sexually Transmitted Diseases (STDs)  You should be screened each year for STDs including gonorrhea and chlamydia if:  You are sexually active and are younger than 71 years of age.  You are older than 71 years of age and your health care provider tells you that you are at risk for this type of infection.  Your sexual activity has changed since you were last screened and you are at an increased risk for chlamydia or gonorrhea. Ask your health care provider if you are at risk.  Talk with your health care provider about whether you are at high risk of being infected with HIV. Your health care provider may recommend a prescription medicine to help prevent HIV infection. What else can I do?  Schedule regular health, dental, and eye exams.  Stay current with your vaccines (immunizations).  Do not use any tobacco products, such as cigarettes, chewing tobacco, and e-cigarettes. If you need help quitting, ask your health care provider.  Limit alcohol intake to no more than 2 drinks per day. One drink equals 12 ounces of beer, 5 ounces of wine, or 1 ounces of hard liquor.  Do not use street drugs.  Do not share needles.  Ask your health  care provider for help if you need support or information about quitting drugs.  Tell your health care provider if you often feel depressed.  Tell your health care provider if you have ever been abused or do not feel safe at home. This information is not intended to replace advice given to you by your health care provider. Make sure you discuss any questions you have with your health care provider. Document Released: 10/28/2007 Document Revised: 12/29/2015 Document Reviewed: 02/02/2015 Elsevier Interactive Patient Education  2017 [employer]  

## 2016-07-28 ENCOUNTER — Encounter: Payer: Self-pay | Admitting: Internal Medicine

## 2016-07-30 ENCOUNTER — Other Ambulatory Visit: Payer: Self-pay | Admitting: Internal Medicine

## 2016-07-30 NOTE — Assessment & Plan Note (Signed)
Lab Results  Component Value Date   WBC 4.1 07/27/2016   HGB 11.9 (L) 07/27/2016   HCT 35.1 (L) 07/27/2016   PLT 209.0 07/27/2016   GLUCOSE 190 (H) 07/25/2016   CHOL 159 07/25/2016   TRIG 84.0 07/25/2016   HDL 28.30 (L) 07/25/2016   LDLCALC 114 (H) 07/25/2016   ALT 15 07/25/2016   AST 14 07/25/2016   NA 136 07/25/2016   K 4.3 07/25/2016   CL 102 07/25/2016   CREATININE 1.62 (H) 07/25/2016   BUN 21 07/25/2016   CO2 25 07/25/2016   TSH 0.67 06/14/2015   PSA 0.09 (L) 12/10/2013   INR 1.02 07/14/2010   HGBA1C 9.6 (H) 07/25/2016   MICROALBUR 118.0 (H) 07/25/2016

## 2016-08-25 LAB — PSA: PSA: 0.046

## 2016-08-30 LAB — HM DIABETES EYE EXAM

## 2016-09-04 DIAGNOSIS — K8689 Other specified diseases of pancreas: Secondary | ICD-10-CM | POA: Diagnosis not present

## 2016-09-04 DIAGNOSIS — K3189 Other diseases of stomach and duodenum: Secondary | ICD-10-CM | POA: Diagnosis not present

## 2016-09-04 DIAGNOSIS — E119 Type 2 diabetes mellitus without complications: Secondary | ICD-10-CM | POA: Diagnosis not present

## 2016-09-04 DIAGNOSIS — K869 Disease of pancreas, unspecified: Secondary | ICD-10-CM | POA: Diagnosis not present

## 2016-09-04 DIAGNOSIS — Z888 Allergy status to other drugs, medicaments and biological substances status: Secondary | ICD-10-CM | POA: Diagnosis not present

## 2016-09-04 DIAGNOSIS — Z7982 Long term (current) use of aspirin: Secondary | ICD-10-CM | POA: Diagnosis not present

## 2016-09-04 DIAGNOSIS — Z794 Long term (current) use of insulin: Secondary | ICD-10-CM | POA: Diagnosis not present

## 2016-09-04 DIAGNOSIS — Z8719 Personal history of other diseases of the digestive system: Secondary | ICD-10-CM | POA: Diagnosis not present

## 2016-09-04 DIAGNOSIS — C7A010 Malignant carcinoid tumor of the duodenum: Secondary | ICD-10-CM | POA: Diagnosis not present

## 2016-09-04 DIAGNOSIS — Z09 Encounter for follow-up examination after completed treatment for conditions other than malignant neoplasm: Secondary | ICD-10-CM | POA: Diagnosis not present

## 2016-09-04 DIAGNOSIS — I10 Essential (primary) hypertension: Secondary | ICD-10-CM | POA: Diagnosis not present

## 2016-09-04 DIAGNOSIS — Z79899 Other long term (current) drug therapy: Secondary | ICD-10-CM | POA: Diagnosis not present

## 2016-09-04 DIAGNOSIS — Z79891 Long term (current) use of opiate analgesic: Secondary | ICD-10-CM | POA: Diagnosis not present

## 2016-10-03 ENCOUNTER — Telehealth: Payer: Self-pay | Admitting: Internal Medicine

## 2016-10-03 NOTE — Telephone Encounter (Signed)
Spoke to pt and he states that his stools are normal firmness but are black tar colored. Pt also states that he sees the color red in the commode

## 2016-10-03 NOTE — Telephone Encounter (Signed)
Pt came by requesting an order be put in for a stool culture for some on going issues that he has been having. The pt would like a call when this has been done.

## 2016-10-03 NOTE — Telephone Encounter (Signed)
Please advise 

## 2016-10-03 NOTE — Telephone Encounter (Signed)
He needs to be seen

## 2016-10-03 NOTE — Telephone Encounter (Signed)
Does he has diarrhea?

## 2016-10-04 NOTE — Telephone Encounter (Signed)
Contacted pt and informed that Dr. Ronnald Ramp needs him to come in for a visit.   Pt scheduled for 10/12/2016 at 4pm. Pt will call back if it conficts with his schedule.

## 2016-10-12 ENCOUNTER — Encounter: Payer: Self-pay | Admitting: Internal Medicine

## 2016-10-12 ENCOUNTER — Other Ambulatory Visit (INDEPENDENT_AMBULATORY_CARE_PROVIDER_SITE_OTHER): Payer: Medicare Other

## 2016-10-12 ENCOUNTER — Ambulatory Visit (INDEPENDENT_AMBULATORY_CARE_PROVIDER_SITE_OTHER): Payer: Medicare Other | Admitting: Internal Medicine

## 2016-10-12 VITALS — BP 150/84 | HR 58 | Temp 97.9°F | Resp 16 | Ht 71.0 in | Wt 224.0 lb

## 2016-10-12 DIAGNOSIS — K648 Other hemorrhoids: Secondary | ICD-10-CM | POA: Diagnosis not present

## 2016-10-12 DIAGNOSIS — D539 Nutritional anemia, unspecified: Secondary | ICD-10-CM

## 2016-10-12 LAB — CBC WITH DIFFERENTIAL/PLATELET
BASOS ABS: 0 10*3/uL (ref 0.0–0.1)
Basophils Relative: 1 % (ref 0.0–3.0)
EOS PCT: 2.3 % (ref 0.0–5.0)
Eosinophils Absolute: 0.1 10*3/uL (ref 0.0–0.7)
HCT: 36.1 % — ABNORMAL LOW (ref 39.0–52.0)
Hemoglobin: 12.3 g/dL — ABNORMAL LOW (ref 13.0–17.0)
LYMPHS PCT: 31.7 % (ref 12.0–46.0)
Lymphs Abs: 1.3 10*3/uL (ref 0.7–4.0)
MCHC: 34.2 g/dL (ref 30.0–36.0)
MCV: 85.9 fl (ref 78.0–100.0)
MONOS PCT: 15.4 % — AB (ref 3.0–12.0)
Monocytes Absolute: 0.6 10*3/uL (ref 0.1–1.0)
NEUTROS ABS: 2.1 10*3/uL (ref 1.4–7.7)
Neutrophils Relative %: 49.6 % (ref 43.0–77.0)
PLATELETS: 214 10*3/uL (ref 150.0–400.0)
RBC: 4.2 Mil/uL — ABNORMAL LOW (ref 4.22–5.81)
RDW: 15 % (ref 11.5–15.5)
WBC: 4.2 10*3/uL (ref 4.0–10.5)

## 2016-10-12 NOTE — Progress Notes (Signed)
Subjective:  Patient ID: Antonio Adams, male    DOB: [birth year]  Age: 71 y.o. MRN: [id number]  CC: Blood In Stools   HPI Antonio Adams presents for Concerns about persistent episodes of blood in his stool that he describes as appearing like ketchup. He tells me that he had an upper endoscopy at [facility] about a month ago that was within normal limits. He also had a colonoscopy about 7 months ago that did not identify any source of bleeding. He has been told that the bleeding is a result of radiation effects on his colon. He has chronic right flank pain but his had no recent episodes of abdominal pain. He complains of weight gain and occasional episode of fatigue but he denies chest pain or shortness of breath.  Outpatient Medications Prior to Visit  Medication Sig Dispense Refill  . aspirin EC 81 MG tablet Take 81 mg by mouth daily.    Marland Kitchen atorvastatin (LIPITOR) 20 MG tablet Take 1 tablet (20 mg total) by mouth daily. (Patient taking differently: Take 20 mg by mouth at bedtime. ) 90 tablet 3  . cholecalciferol (VITAMIN D) 1000 units tablet Take 1,000 Units by mouth daily.    . clotrimazole-betamethasone (LOTRISONE) cream APPLY 1 APPLICATION TOPICALLY 2 (TWO) TIMES DAILY. 30 g 0  . diltiazem (DILACOR XR) 120 MG 24 hr capsule Take 120 mg by mouth daily.      . ferrous sulfate (FERROUSUL) 325 (65 FE) MG tablet Take 1 tablet (325 mg total) by mouth 2 (two) times daily. 60 tablet 11  . gabapentin (NEURONTIN) 300 MG capsule Take 300 mg by mouth at bedtime.    Marland Kitchen gemfibrozil (LOPID) 600 MG tablet Take 600 mg by mouth 2 (two) times daily.     Marland Kitchen glucose blood (ONE TOUCH ULTRA TEST) test strip 1 each by Other route 2 (two) times daily. And lancets 2/day 250.03 100 each 12  . Insulin Glargine (LANTUS SOLOSTAR) 100 UNIT/ML Solostar Pen Inject 50 Units into the skin every morning. 15 mL 3  . JANUMET XR 50-1000 MG TB24 TAKE 1 TABLET BY MOUTH DAILY. 30 tablet 10  . Needles & Syringes MISC Use syringe  and needle to inject insulin into the skin as directed on insulin prescription. DX E11.09 90 each 3  . NOVOFINE PLUS 32G X 4 MM MISC USE DAILY WITH INSULIN 100 each 2  . NOVOFINE PLUS 32G X 4 MM MISC USE DAILY WITH INSULIN 100 each 2  . omeprazole (PRILOSEC) 40 MG capsule TAKE ONE CAPSULE EVERY DAY 30 capsule 10  . sertraline (ZOLOFT) 100 MG tablet Take 1 tablet by mouth daily. Reported on 08/06/2015    . tamsulosin (FLOMAX) 0.4 MG CAPS capsule TAKE 1 CAPSULE (0.4 MG TOTAL) BY MOUTH DAILY. 30 capsule 1  . telmisartan (MICARDIS) 40 MG tablet Take 1 tablet (40 mg total) by mouth daily. 90 tablet 1  . zolpidem (AMBIEN) 10 MG tablet Take 10 mg by mouth at bedtime as needed for sleep.      No facility-administered medications prior to visit.     ROS Review of Systems  Constitutional: Positive for fatigue and unexpected weight change (wt gain). Negative for appetite change, chills and diaphoresis.  HENT: Negative.  Negative for trouble swallowing.   Eyes: Negative.   Respiratory: Negative for cough, chest tightness, shortness of breath and wheezing.   Cardiovascular: Negative for chest pain, palpitations and leg swelling.  Gastrointestinal: Positive for anal bleeding and blood in stool. Negative  for abdominal pain, constipation, diarrhea, nausea and vomiting.  Endocrine: Negative.   Genitourinary: Negative.  Negative for difficulty urinating and hematuria.  Musculoskeletal: Negative.  Negative for back pain and myalgias.  Skin: Negative.  Negative for color change and rash.  Allergic/Immunologic: Negative.   Neurological: Negative.  Negative for dizziness, weakness and light-headedness.  Hematological: Negative.   Psychiatric/Behavioral: Negative.     Objective:  BP (!) 150/84 (BP Location: Left Arm, Patient Position: Sitting, Cuff Size: Normal)   Pulse (!) 58   Temp 97.9 F (36.6 C) (Oral)   Resp 16   Ht 5\' 11"  (1.803 m)   Wt 224 lb (101.6 kg)   SpO2 97%   BMI 31.24 kg/m   BP  Readings from Last 3 Encounters:  10/12/16 (!) 150/84  07/27/16 (!) 148/78  06/23/16 (!) 150/84    Wt Readings from Last 3 Encounters:  10/12/16 224 lb (101.6 kg)  07/27/16 212 lb 1.3 oz (96.2 kg)  06/23/16 225 lb (102.1 kg)    Physical Exam  Constitutional: He is oriented to person, place, and time. No distress.  HENT:  Mouth/Throat: Oropharynx is clear and moist. No oropharyngeal exudate.  Eyes: Conjunctivae are normal. Right eye exhibits no discharge. Left eye exhibits no discharge. No scleral icterus.  Neck: Normal range of motion. Neck supple.  Cardiovascular: Normal rate and regular rhythm.   No murmur heard. Pulmonary/Chest: Effort normal and breath sounds normal. No respiratory distress. He has no wheezes. He has no rales. He exhibits no tenderness.  Abdominal: Soft. Bowel sounds are normal. He exhibits no distension and no mass. There is no tenderness. There is no rebound and no guarding.  Genitourinary: Prostate normal. Rectal exam shows external hemorrhoid, internal hemorrhoid and guaiac positive stool. Rectal exam shows no fissure, no mass, no tenderness and anal tone normal. Prostate is not enlarged and not tender.  Genitourinary Comments: He has one, small, uncomplicated external hemorrhoids and mild internal anal hemorrhoids.  Musculoskeletal: He exhibits no edema.  Neurological: He is alert and oriented to person, place, and time. He has normal reflexes.  Skin: Skin is warm and dry. He is not diaphoretic. No erythema. No pallor.  Vitals reviewed.   Lab Results  Component Value Date   WBC 4.2 10/12/2016   HGB 12.3 (L) 10/12/2016   HCT 36.1 (L) 10/12/2016   PLT 214.0 10/12/2016   GLUCOSE 190 (H) 07/25/2016   CHOL 159 07/25/2016   TRIG 84.0 07/25/2016   HDL 28.30 (L) 07/25/2016   LDLCALC 114 (H) 07/25/2016   ALT 15 07/25/2016   AST 14 07/25/2016   NA 136 07/25/2016   K 4.3 07/25/2016   CL 102 07/25/2016   CREATININE 1.62 (H) 07/25/2016   BUN 21 07/25/2016     CO2 25 07/25/2016   TSH 0.67 06/14/2015   PSA 0.09 (L) 12/10/2013   INR 1.02 07/14/2010   HGBA1C 9.6 (H) 07/25/2016   MICROALBUR 118.0 (H) 07/25/2016    Mr Abdomen Wwo Contrast  Result Date: 07/07/2016 CLINICAL DATA:  Right flank pain EXAM: MRI ABDOMEN WITHOUT AND WITH CONTRAST TECHNIQUE: Multiplanar multisequence MR imaging of the abdomen was performed both before and after the administration of intravenous contrast. CONTRAST:  66mL MULTIHANCE GADOBENATE DIMEGLUMINE 529 MG/ML IV SOLN COMPARISON:  04/21/2016 FINDINGS: Lower chest: The lung bases are clear. No pleural or pericardial effusion. Hepatobiliary: No focal liver abnormality. The gallbladder appears normal. No biliary dilatation. Pancreas: Previously noted gallstones are not visualized on today's study. No biliary ductal dilatation.  Spleen:  The spleen is normal. Adrenals/Urinary Tract: The adrenal glands are unremarkable. Postoperative changes from right upper pole partial nephrectomy are identified and appears similar to previous exam. Right upper pole cyst is noted measuring 2.3 cm, image 18 of series 8. Small left kidney cysts. Stomach/Bowel: The stomach is normal. The visualized upper abdominal bowel loops are unremarkable. Vascular/Lymphatic: No pathologically enlarged lymph nodes identified. No abdominal aortic aneurysm demonstrated. Other:  None. Musculoskeletal: No suspicious bone lesions identified. IMPRESSION: 1. No acute findings identified within the abdomen. 2. Previously noted gallstones are no longer visualized and may have passed. No biliary dilatation identified at this time and no evidence for choledocholithiasis. 3. Stable postsurgical changes of partial nephrectomy from the upper pole of right kidney. Electronically Signed   By: Kerby Moors M.D.   On: 07/07/2016 11:16    Assessment & Plan:   Hermann was seen today for blood in stools.  Diagnoses and all orders for this visit:  Deficiency anemia- his H&H are  stable, his iron and vitamin B12 levels are normal. This appears to be most consistent with the anemia of chronic disease related to chronic, poorly controlled diabetes and a history of renal cell carcinoma. -     CBC with Differential/Platelet; Future -     IBC panel; Future -     Ferritin; Future -     Vitamin B1; Future  Internal hemorrhoid, bleeding- I've asked him to see GI again to see if he would benefit from hemorrhoidal banding procedure. -     Ambulatory referral to Gastroenterology   I am having Mr. Moradi maintain his diltiazem, gemfibrozil, zolpidem, sertraline, omeprazole, atorvastatin, tamsulosin, glucose blood, Needles & Syringes, gabapentin, aspirin EC, JANUMET XR, clotrimazole-betamethasone, cholecalciferol, ferrous sulfate, NOVOFINE PLUS, NOVOFINE PLUS, Insulin Glargine, and telmisartan.  No orders of the defined types were placed in this encounter.    Follow-up: Return in about 6 weeks (around 11/23/2016).  Scarlette Calico, MD

## 2016-10-12 NOTE — Patient Instructions (Signed)
Anemia, Nonspecific Anemia is a condition in which the concentration of red blood cells or hemoglobin in the blood is below normal. Hemoglobin is a substance in red blood cells that carries oxygen to the tissues of the body. Anemia results in not enough oxygen reaching these tissues. What are the causes? Common causes of anemia include:  Excessive bleeding. Bleeding may be internal or external. This includes excessive bleeding from periods (in women) or from the intestine.  Poor nutrition.  Chronic kidney, thyroid, and liver disease.  Bone marrow disorders that decrease red blood cell production.  Cancer and treatments for cancer.  HIV, AIDS, and their treatments.  Spleen problems that increase red blood cell destruction.  Blood disorders.  Excess destruction of red blood cells due to infection, medicines, and autoimmune disorders. What are the signs or symptoms?  Minor weakness.  Dizziness.  Headache.  Palpitations.  Shortness of breath, especially with exercise.  Paleness.  Cold sensitivity.  Indigestion.  Nausea.  Difficulty sleeping.  Difficulty concentrating. Symptoms may occur suddenly or they may develop slowly. How is this diagnosed? Additional blood tests are often needed. These help your health care provider determine the best treatment. Your health care provider will check your stool for blood and look for other causes of blood loss. How is this treated? Treatment varies depending on the cause of the anemia. Treatment can include:  Supplements of iron, vitamin B12, or folic acid.  Hormone medicines.  A blood transfusion. This may be needed if blood loss is severe.  Hospitalization. This may be needed if there is significant continual blood loss.  Dietary changes.  Spleen removal. Follow these instructions at home: Keep all follow-up appointments. It often takes many weeks to correct anemia, and having your health care provider check on your  condition and your response to treatment is very important. Get help right away if:  You develop extreme weakness, shortness of breath, or chest pain.  You become dizzy or have trouble concentrating.  You develop heavy vaginal bleeding.  You develop a rash.  You have bloody or black, tarry stools.  You faint.  You vomit up blood.  You vomit repeatedly.  You have abdominal pain.  You have a fever or persistent symptoms for more than 2-3 days.  You have a fever and your symptoms suddenly get worse.  You are dehydrated. This information is not intended to replace advice given to you by your health care provider. Make sure you discuss any questions you have with your health care provider. Document Released: 06/08/2004 Document Revised: 10/13/2015 Document Reviewed: 10/25/2012 Elsevier Interactive Patient Education  2017 [employer]  

## 2016-10-13 LAB — IBC PANEL
Iron: 113 ug/dL (ref 42–165)
SATURATION RATIOS: 27.7 % (ref 20.0–50.0)
Transferrin: 291 mg/dL (ref 212.0–360.0)

## 2016-10-13 LAB — FERRITIN: Ferritin: 46.4 ng/mL (ref 22.0–322.0)

## 2016-10-16 ENCOUNTER — Encounter: Payer: Self-pay | Admitting: Internal Medicine

## 2016-10-16 LAB — VITAMIN B1: VITAMIN B1 (THIAMINE): 26 nmol/L (ref 8–30)

## 2016-10-18 ENCOUNTER — Other Ambulatory Visit: Payer: Self-pay | Admitting: Internal Medicine

## 2016-11-01 ENCOUNTER — Ambulatory Visit (INDEPENDENT_AMBULATORY_CARE_PROVIDER_SITE_OTHER): Payer: Medicare Other | Admitting: Internal Medicine

## 2016-11-01 ENCOUNTER — Other Ambulatory Visit (INDEPENDENT_AMBULATORY_CARE_PROVIDER_SITE_OTHER): Payer: Medicare Other

## 2016-11-01 ENCOUNTER — Encounter: Payer: Self-pay | Admitting: Internal Medicine

## 2016-11-01 VITALS — BP 132/72 | HR 94 | Temp 97.7°F | Ht 71.0 in | Wt 220.1 lb

## 2016-11-01 DIAGNOSIS — Z794 Long term (current) use of insulin: Secondary | ICD-10-CM

## 2016-11-01 DIAGNOSIS — J0100 Acute maxillary sinusitis, unspecified: Secondary | ICD-10-CM | POA: Insufficient documentation

## 2016-11-01 DIAGNOSIS — J301 Allergic rhinitis due to pollen: Secondary | ICD-10-CM | POA: Insufficient documentation

## 2016-11-01 DIAGNOSIS — E118 Type 2 diabetes mellitus with unspecified complications: Secondary | ICD-10-CM | POA: Diagnosis not present

## 2016-11-01 LAB — POCT GLYCOSYLATED HEMOGLOBIN (HGB A1C): HEMOGLOBIN A1C: 10

## 2016-11-01 LAB — BASIC METABOLIC PANEL
BUN: 27 mg/dL — ABNORMAL HIGH (ref 6–23)
CALCIUM: 10.6 mg/dL — AB (ref 8.4–10.5)
CO2: 24 mEq/L (ref 19–32)
CREATININE: 1.81 mg/dL — AB (ref 0.40–1.50)
Chloride: 98 mEq/L (ref 96–112)
GFR: 47.83 mL/min — ABNORMAL LOW (ref >60.00)
GLUCOSE: 228 mg/dL — AB (ref 70–99)
Potassium: 4.1 mEq/L (ref 3.5–5.1)
Sodium: 131 mEq/L — ABNORMAL LOW (ref 135–145)

## 2016-11-01 LAB — POCT GLUCOSE (DEVICE FOR HOME USE): POC Glucose: 265 mg/dl — AB (ref 70–99)

## 2016-11-01 MED ORDER — INSULIN PEN NEEDLE 32G X 6 MM MISC: 1.0000 | Freq: Every day | 11 refills | Status: DC

## 2016-11-01 MED ORDER — TRIAMCINOLONE ACETONIDE 55 MCG/ACT NA AERO: 2.0000 | INHALATION_SPRAY | Freq: Every day | NASAL | 3 refills | Status: DC

## 2016-11-01 MED ORDER — AMOXICILLIN-POT CLAVULANATE 875-125 MG PO TABS: 1.0000 | ORAL_TABLET | Freq: Two times a day (BID) | ORAL | 0 refills | Status: AC

## 2016-11-01 MED ORDER — INSULIN GLARGINE 100 UNIT/ML SOLOSTAR PEN: 70.0000 [IU] | PEN_INJECTOR | SUBCUTANEOUS | 3 refills | Status: DC

## 2016-11-01 MED ORDER — CANAGLIFLOZIN 100 MG PO TABS: 100.0000 mg | ORAL_TABLET | Freq: Every day | ORAL | 1 refills | Status: DC

## 2016-11-01 MED ORDER — INSULIN LISPRO 200 UNIT/ML ~~LOC~~ SOPN: 5.0000 [IU] | PEN_INJECTOR | Freq: Three times a day (TID) | SUBCUTANEOUS | 11 refills | Status: DC

## 2016-11-01 NOTE — Progress Notes (Signed)
Subjective:  Patient ID: Antonio Adams, male    DOB: 1945/08/18  Age: 71 y.o. MRN: [id number]  CC: Sinusitis; Allergic Rhinitis ; and Diabetes   HPI Antonio Adams presents for f/up - He complains that his blood sugars have not recently been well controlled and he has had some episodes of polyuria, polydipsia, polyphagia.  He complains of a 3 day history of left facial pain with sneezing, runny nose, postnasal drip, cough productive of thick yellow phlegm, no improvement with Alka-Seltzer cold plus, Mucinex, and Delsym.  Outpatient Medications Prior to Visit  Medication Sig Dispense Refill  . aspirin EC 81 MG tablet Take 81 mg by mouth daily.    Marland Kitchen atorvastatin (LIPITOR) 20 MG tablet Take 1 tablet (20 mg total) by mouth daily. (Patient taking differently: Take 20 mg by mouth at bedtime. ) 90 tablet 3  . cholecalciferol (VITAMIN D) 1000 units tablet Take 1,000 Units by mouth daily.    . clotrimazole-betamethasone (LOTRISONE) cream APPLY 1 APPLICATION TOPICALLY 2 (TWO) TIMES DAILY. 30 g 0  . diltiazem (DILACOR XR) 120 MG 24 hr capsule Take 120 mg by mouth daily.      . ferrous sulfate (FERROUSUL) 325 (65 FE) MG tablet Take 1 tablet (325 mg total) by mouth 2 (two) times daily. 60 tablet 11  . gabapentin (NEURONTIN) 300 MG capsule Take 300 mg by mouth at bedtime.    Marland Kitchen gemfibrozil (LOPID) 600 MG tablet Take 600 mg by mouth 2 (two) times daily.     Marland Kitchen JANUMET XR 50-1000 MG TB24 TAKE 1 TABLET BY MOUTH DAILY. 30 tablet 3  . Needles & Syringes MISC Use syringe and needle to inject insulin into the skin as directed on insulin prescription. DX E11.09 90 each 3  . NOVOFINE PLUS 32G X 4 MM MISC USE DAILY WITH INSULIN 100 each 2  . omeprazole (PRILOSEC) 40 MG capsule TAKE ONE CAPSULE EVERY DAY 30 capsule 10  . sertraline (ZOLOFT) 100 MG tablet Take 1 tablet by mouth daily. Reported on 08/06/2015    . tamsulosin (FLOMAX) 0.4 MG CAPS capsule TAKE 1 CAPSULE (0.4 MG TOTAL) BY MOUTH DAILY. 30  capsule 1  . telmisartan (MICARDIS) 40 MG tablet Take 1 tablet (40 mg total) by mouth daily. 90 tablet 1  . zolpidem (AMBIEN) 10 MG tablet Take 10 mg by mouth at bedtime as needed for sleep.     Marland Kitchen glucose blood (ONE TOUCH ULTRA TEST) test strip 1 each by Other route 2 (two) times daily. And lancets 2/day 250.03 100 each 12  . Insulin Glargine (LANTUS SOLOSTAR) 100 UNIT/ML Solostar Pen Inject 50 Units into the skin every morning. 15 mL 3  . NOVOFINE PLUS 32G X 4 MM MISC USE DAILY WITH INSULIN 100 each 2   No facility-administered medications prior to visit.     ROS Review of Systems  Constitutional: Negative.  Negative for activity change, appetite change, chills, diaphoresis, fatigue, fever and unexpected weight change.  HENT: Positive for congestion, postnasal drip, rhinorrhea, sinus pain, sinus pressure, sneezing and sore throat. Negative for ear pain, facial swelling, tinnitus, trouble swallowing and voice change.   Eyes: Negative.  Negative for visual disturbance.  Respiratory: Negative for chest tightness, shortness of breath and wheezing.   Cardiovascular: Negative for chest pain, palpitations and leg swelling.  Gastrointestinal: Negative for abdominal pain, constipation, diarrhea, nausea and vomiting.  Endocrine: Positive for polydipsia, polyphagia and polyuria. Negative for cold intolerance and heat intolerance.  Genitourinary: Negative.  Negative  for difficulty urinating, dysuria, frequency, hematuria and urgency.  Musculoskeletal: Negative.   Skin: Negative.  Negative for rash.  Allergic/Immunologic: Negative.   Neurological: Negative.  Negative for dizziness, weakness and numbness.  Hematological: Negative.  Negative for adenopathy. Does not bruise/bleed easily.  Psychiatric/Behavioral: Negative.     Objective:  BP 132/72 (BP Location: Left Arm, Patient Position: Sitting, Cuff Size: Normal)   Pulse 94   Temp 97.7 F (36.5 C) (Oral)   Ht 5\' 11"  (1.803 m)   Wt 220 lb 1.3 oz  (99.8 kg)   SpO2 99%   BMI 30.69 kg/m   BP Readings from Last 3 Encounters:  11/01/16 132/72  10/12/16 (!) 150/84  07/27/16 (!) 148/78    Wt Readings from Last 3 Encounters:  11/01/16 220 lb 1.3 oz (99.8 kg)  10/12/16 224 lb (101.6 kg)  07/27/16 212 lb 1.3 oz (96.2 kg)    Physical Exam  Constitutional: No distress.  HENT:  Right Ear: Hearing, tympanic membrane, external ear and ear canal normal.  Left Ear: Hearing, tympanic membrane, external ear and ear canal normal.  Nose: No mucosal edema or rhinorrhea. Right sinus exhibits no maxillary sinus tenderness and no frontal sinus tenderness. Left sinus exhibits maxillary sinus tenderness. Left sinus exhibits no frontal sinus tenderness.  Mouth/Throat: Oropharynx is clear and moist and mucous membranes are normal. Mucous membranes are not pale and not cyanotic. No oral lesions. No trismus in the jaw. No uvula swelling. No oropharyngeal exudate.  Eyes: Conjunctivae are normal. Right eye exhibits no discharge. Left eye exhibits no discharge. No scleral icterus.  Neck: Normal range of motion. Neck supple. No JVD present. No thyromegaly present.  Cardiovascular: Normal rate, regular rhythm and intact distal pulses.  Exam reveals no gallop.   No murmur heard. Pulmonary/Chest: Effort normal and breath sounds normal. No respiratory distress. He has no wheezes. He has no rales. He exhibits no tenderness.  Abdominal: Soft. Bowel sounds are normal. He exhibits no distension and no mass. There is no tenderness. There is no rebound and no guarding.  Lymphadenopathy:    He has no cervical adenopathy.  Skin: He is not diaphoretic.  Vitals reviewed.   Lab Results  Component Value Date   WBC 4.2 10/12/2016   HGB 12.3 (L) 10/12/2016   HCT 36.1 (L) 10/12/2016   PLT 214.0 10/12/2016   GLUCOSE 228 (H) 11/01/2016   CHOL 159 07/25/2016   TRIG 84.0 07/25/2016   HDL 28.30 (L) 07/25/2016   LDLCALC 114 (H) 07/25/2016   ALT 15 07/25/2016   AST 14  07/25/2016   NA 131 (L) 11/01/2016   K 4.1 11/01/2016   CL 98 11/01/2016   CREATININE 1.81 (H) 11/01/2016   BUN 27 (H) 11/01/2016   CO2 24 11/01/2016   TSH 0.67 06/14/2015   PSA 0.09 (L) 12/10/2013   INR 1.02 07/14/2010   HGBA1C 10.0 11/01/2016   MICROALBUR 118.0 (H) 07/25/2016    Mr Abdomen Wwo Contrast  Result Date: 07/07/2016 CLINICAL DATA:  Right flank pain EXAM: MRI ABDOMEN WITHOUT AND WITH CONTRAST TECHNIQUE: Multiplanar multisequence MR imaging of the abdomen was performed both before and after the administration of intravenous contrast. CONTRAST:  74mL MULTIHANCE GADOBENATE DIMEGLUMINE 529 MG/ML IV SOLN COMPARISON:  04/21/2016 FINDINGS: Lower chest: The lung bases are clear. No pleural or pericardial effusion. Hepatobiliary: No focal liver abnormality. The gallbladder appears normal. No biliary dilatation. Pancreas: Previously noted gallstones are not visualized on today's study. No biliary ductal dilatation. Spleen:  The spleen  is normal. Adrenals/Urinary Tract: The adrenal glands are unremarkable. Postoperative changes from right upper pole partial nephrectomy are identified and appears similar to previous exam. Right upper pole cyst is noted measuring 2.3 cm, image 18 of series 8. Small left kidney cysts. Stomach/Bowel: The stomach is normal. The visualized upper abdominal bowel loops are unremarkable. Vascular/Lymphatic: No pathologically enlarged lymph nodes identified. No abdominal aortic aneurysm demonstrated. Other:  None. Musculoskeletal: No suspicious bone lesions identified. IMPRESSION: 1. No acute findings identified within the abdomen. 2. Previously noted gallstones are no longer visualized and may have passed. No biliary dilatation identified at this time and no evidence for choledocholithiasis. 3. Stable postsurgical changes of partial nephrectomy from the upper pole of right kidney. Electronically Signed   By: Kerby Moors M.D.   On: 07/07/2016 11:16    Assessment & Plan:    Dex was seen today for sinusitis, allergic rhinitis  and diabetes.  Diagnoses and all orders for this visit:  Type 2 diabetes mellitus with complication, with long-term current use of insulin ([place])- his A1c is up to 10.1%, his blood sugars are very poorly controlled. I've asked him to increase the dose of his basal insulin and to add 5 units of quick acting insulin at mealtime. In addition we'll start an SGLT2 inhibitor. I have encouraged him to be seen for diabetic education. -     POCT glycosylated hemoglobin (Hb A1C) -     POCT Glucose (Device for Home Use) -     Basic metabolic panel; Future -     Insulin Glargine (LANTUS SOLOSTAR) 100 UNIT/ML Solostar Pen; Inject 70 Units into the skin every morning. -     Insulin Lispro (HUMALOG KWIKPEN) 200 UNIT/ML SOPN; Inject 5 Units into the skin 3 (three) times daily after meals. -     Amb Referral to Nutrition and Diabetic E -     Insulin Pen Needle (NOVOFINE) 32G X 6 MM MISC; 1 Act by Does not apply route daily. USE TID -     canagliflozin (INVOKANA) 100 MG TABS tablet; Take 1 tablet (100 mg total) by mouth daily before breakfast.  Seasonal allergic rhinitis due to pollen -     triamcinolone (NASACORT) 55 MCG/ACT AERO nasal inhaler; Place 2 sprays into the nose daily.  Acute maxillary sinusitis, recurrence not specified- I will treat the infection with Augmentin. -     amoxicillin-clavulanate (AUGMENTIN) 875-125 MG tablet; Take 1 tablet by mouth 2 (two) times daily.   I have changed Mr. Himes Insulin Glargine. I am also having him start on Insulin Lispro, Insulin Pen Needle, canagliflozin, amoxicillin-clavulanate, and triamcinolone. Additionally, I am having him maintain his diltiazem, gemfibrozil, zolpidem, sertraline, omeprazole, atorvastatin, tamsulosin, Needles & Syringes, gabapentin, aspirin EC, clotrimazole-betamethasone, cholecalciferol, ferrous sulfate, NOVOFINE PLUS, telmisartan, and JANUMET XR.  Meds ordered this encounter    Medications  . Insulin Glargine (LANTUS SOLOSTAR) 100 UNIT/ML Solostar Pen    Sig: Inject 70 Units into the skin every morning.    Dispense:  15 mL    Refill:  3  . Insulin Lispro (HUMALOG KWIKPEN) 200 UNIT/ML SOPN    Sig: Inject 5 Units into the skin 3 (three) times daily after meals.    Dispense:  3 mL    Refill:  11  . Insulin Pen Needle (NOVOFINE) 32G X 6 MM MISC    Sig: 1 Act by Does not apply route daily. USE TID    Dispense:  100 each    Refill:  11  .  canagliflozin (INVOKANA) 100 MG TABS tablet    Sig: Take 1 tablet (100 mg total) by mouth daily before breakfast.    Dispense:  90 tablet    Refill:  1  . amoxicillin-clavulanate (AUGMENTIN) 875-125 MG tablet    Sig: Take 1 tablet by mouth 2 (two) times daily.    Dispense:  20 tablet    Refill:  0  . triamcinolone (NASACORT) 55 MCG/ACT AERO nasal inhaler    Sig: Place 2 sprays into the nose daily.    Dispense:  32.4 mL    Refill:  3     Follow-up: Return in about 3 weeks (around 11/22/2016).  Scarlette Calico, MD

## 2016-11-01 NOTE — Patient Instructions (Signed)

## 2016-11-05 ENCOUNTER — Other Ambulatory Visit: Payer: Self-pay | Admitting: Internal Medicine

## 2016-11-09 ENCOUNTER — Ambulatory Visit (INDEPENDENT_AMBULATORY_CARE_PROVIDER_SITE_OTHER): Payer: Medicare Other | Admitting: Specialist

## 2016-11-09 ENCOUNTER — Encounter (INDEPENDENT_AMBULATORY_CARE_PROVIDER_SITE_OTHER): Payer: Self-pay | Admitting: Specialist

## 2016-11-09 VITALS — BP 146/83 | HR 65 | Temp 97.6°F | Ht 71.0 in | Wt 222.0 lb

## 2016-11-09 DIAGNOSIS — M25562 Pain in left knee: Secondary | ICD-10-CM | POA: Diagnosis not present

## 2016-11-09 DIAGNOSIS — G8929 Other chronic pain: Secondary | ICD-10-CM | POA: Diagnosis not present

## 2016-11-09 DIAGNOSIS — M5136 Other intervertebral disc degeneration, lumbar region: Secondary | ICD-10-CM

## 2016-11-09 NOTE — Progress Notes (Signed)
Office Visit Note   Patient: Antonio Adams           Date of Birth: 1945/05/25           MRN: [id number] Visit Date: 11/09/2016              Requested by: [name], MD [address] [place], [place] [address] PCP: [name], MD   Assessment & Plan: Visit Diagnoses:  1. Chronic pain of left knee   2. Degenerative disc disease, lumbar     Plan: Patient doing reasonably well at this point. We'll follow-up the office in 6 months for recheck. With the intermittent left posteromedial knee pain that he is describing I think he may have a degenerative meniscus tear. If he begins to have true mechanical symptoms he will let me know and I will schedule MRI.  If his back becomes more of an issue may need to consider further imaging with MRI as well. All questions answered.  Follow-Up Instructions: Return in about 6 months (around 05/11/2017).   Orders:  No orders of the defined types were placed in this encounter.  No orders of the defined types were placed in this encounter.     Procedures: No procedures performed   Clinical Data: No additional findings.   Subjective: Chief Complaint  Patient presents with  . Lower Back - Follow-up    Doing fairly well now.  . Left Knee - Follow-up    Pain comes and goes, still has burning sensation on medial side.    HPI Patient returns for recheck of low back pain and left knee pain. States that both areas are doing reasonably well. Some soreness in his back with increased activity but does state that he does try to use proper body mechanics avoid certain maneuvers aggravate his problem. I describing any lower extremity radicular symptoms. Still has some intermittent left medial knee pain. Denies true locking or feelings of instability. Review of Systems No current cardiac pulmonary GI GU issues.  Objective: Vital Signs: BP (!) 146/83 (BP Location: Left Arm, Patient Position: Sitting)   Pulse 65   Temp  97.6 F (36.4 C) (Oral)   Ht 5\' 11"  (1.803 m)   Wt 222 lb (100.7 kg)   BMI 30.96 kg/m   Physical Exam  Constitutional: He is oriented to person, place, and time. He appears well-developed. No distress.  HENT:  Head: Normocephalic and atraumatic.  Eyes: EOM are normal. Pupils are equal, round, and reactive to light.  Pulmonary/Chest: No respiratory distress.  Musculoskeletal:  Gait is normal. Negative logroll bilateral hips. Negative straight leg raise. Left knee good range of motion. He is mildly tender at the posteromedial joint line. Positive McMurray's test. Ligaments are stable. No palpable effusion. Calf nontender.  Neurological: He is alert and oriented to person, place, and time.  Skin: Skin is warm and dry.  Psychiatric: He has a normal mood and affect.    Ortho Exam  Specialty Comments:  No specialty comments available.  Imaging: No results found.   PMFS History: Patient Active Problem List   Diagnosis Date Noted  . Seasonal allergic rhinitis due to pollen 11/01/2016  . Acute maxillary sinusitis 11/01/2016  . Microalbuminuria due to type 2 diabetes mellitus ([place]) 07/27/2016  . Renal cell carcinoma ([place]) 10/06/2015  . Varicose veins of both lower extremities 08/06/2015  . Varicose veins of lower extremities with ulcer ([place]) 06/16/2015  . Other constipation 08/03/2014  . Internal  hemorrhoid, bleeding 08/03/2014  . Post traumatic stress disorder (PTSD) 01/29/2014  . Deficiency anemia 12/10/2013  . Radiation proctitis 06/04/2012  . Spinal stenosis of lumbar region with radiculopathy 10/30/2011    Class: Chronic  . OSA (obstructive sleep apnea) 11/24/2010  . Obesity, Class I, BMI 30-34.9 11/24/2010  . Routine general medical examination at a health care facility 11/24/2010  . ADENOCARCINOMA, PROSTATE, GLEASON GRADE 6 12/21/2009  . Type II diabetes mellitus with manifestations ([place]) 03/16/2007  . Hyperlipidemia with target LDL less than 100 03/16/2007  .  Essential hypertension 03/16/2007  . GERD 03/16/2007   Past Medical History:  Diagnosis Date  . ADENOCARCINOMA, PROSTATE, GLEASON GRADE 6 12/21/2009   prostate cancer  . ALLERGIC RHINITIS 03/16/2007  . Allergy   . Anxiety   . Arthritis    back   . BACK PAIN WITH RADICULOPATHY 11/23/2008  . Cancer of kidney ([place])    partial right kidney removed  . CARPAL TUNNEL SYNDROME, BILATERAL 03/16/2007  . Chronic back pain   . Constipation    takes Senokot daily  . Depression    occasionally  . DIABETES MELLITUS, TYPE II 03/16/2007   takes Januvia and MEtformin daily  . GASTROINTESTINAL HEMORRHAGE, HX OF 03/16/2007  . GERD 03/16/2007   takes Omeprazole daily  . Glaucoma    mild - no eye drops  . Hemorrhoids   . History of colon polyps   . HYPERLIPIDEMIA 03/16/2007   takes Crestor daily  . HYPERTENSION 03/16/2007   takes Diltiazem and Lisinopril daily   . LEG CRAMPS 05/22/2007  . Nocturia   . Overactive bladder   . PARESTHESIA 05/21/2007  . Proctitis   . Rectal bleeding    Dr.Norins has explained its from the Radiation that he has received  . Sleep apnea    uses CPAP nightly  . Stomach cancer ([place])   . Urinary frequency    takes Toviaz daily    Family History  Problem Relation Age of Onset  . Cancer Sister 30       Cancer, unsure type  . Hypertension Other   . Colon cancer Neg Hx   . Esophageal cancer Neg Hx   . Stomach cancer Neg Hx   . Diabetes Neg Hx   . Colon polyps Neg Hx   . Rectal cancer Neg Hx     Past Surgical History:  Procedure Laterality Date  . [place], 2008   repeat surgery; ESI '08  . CARPAL TUNNEL RELEASE     bilateral  . CERVICAL FUSION    . COLONOSCOPY    . colonosocpy    . ESOPHAGOGASTRODUODENOSCOPY     ED  . LUMBAR LAMINECTOMY  10/30/2011   Procedure: MICRODISCECTOMY LUMBAR LAMINECTOMY;  Surgeon: Jessy Oto, MD;  Location: [facility];  Service: Orthopedics;  Laterality: Right;  Right L4-5 and L5-S1 Microdiscectomy  . LUMBAR LAMINECTOMY   06/17/2012   Procedure: MICRODISCECTOMY LUMBAR LAMINECTOMY;  Surgeon: Jessy Oto, MD;  Location: [facility];  Service: Orthopedics;  Laterality: N/A;  Right L5-S1 microdiscectomy  . POLYPECTOMY    . PROSTATE SURGERY  march 2012   seed implant  . ROBOTIC ASSITED PARTIAL NEPHRECTOMY Right 09/08/2015   Procedure: XI ROBOTIC ASSITED RIGHT PARTIAL NEPHRECTOMY;  Surgeon: [name], MD;  [facility];  Service: Urology;  Laterality: Right;  Clamp on: 1033 Clamp off: 1103 Total Clamp Time: 30 minutes  . SHOULDER ARTHROSCOPY WITH SUBACROMIAL DECOMPRESSION Left 01/20/2016   Procedure: LEFT SHOULDER  ARTHROSCOPY WITH EXTENSIVE  DEBRIDEMENT, ACROMIOPLASTY;  Surgeon: Ninetta Lights, MD;  Location: [facility];  Service: Orthopedics;  Laterality: Left;  . SHOULDER SURGERY  LFT  . Stress Cardiolite  12/24/2003   negative for ischemia  . UPPER ESOPHAGEAL ENDOSCOPIC ULTRASOUND (EUS)  04/18/2016   [facility] hospital  . UPPER GASTROINTESTINAL ENDOSCOPY    . WOUND EXPLORATION     management of a wound that he sustained in the [place] History  . Law Educational psychologist Retired    retired from [employer]   Social History Main Topics  . Smoking status: Never Smoker  . Smokeless tobacco: Never Used  . Alcohol use 0.0 oz/week     Comment: occasional  . Drug use: No  . Sexual activity: Not on file

## 2016-11-21 ENCOUNTER — Encounter: Payer: Self-pay | Admitting: Internal Medicine

## 2016-11-21 ENCOUNTER — Ambulatory Visit (INDEPENDENT_AMBULATORY_CARE_PROVIDER_SITE_OTHER): Payer: Medicare Other | Admitting: Internal Medicine

## 2016-11-21 VITALS — BP 140/76 | HR 68 | Ht 70.08 in | Wt 222.4 lb

## 2016-11-21 DIAGNOSIS — D62 Acute posthemorrhagic anemia: Secondary | ICD-10-CM

## 2016-11-21 DIAGNOSIS — K627 Radiation proctitis: Secondary | ICD-10-CM | POA: Diagnosis not present

## 2016-11-21 DIAGNOSIS — K625 Hemorrhage of anus and rectum: Secondary | ICD-10-CM

## 2016-11-21 DIAGNOSIS — D3A00 Benign carcinoid tumor of unspecified site: Secondary | ICD-10-CM

## 2016-11-21 NOTE — Progress Notes (Signed)
HISTORY OF PRESENT ILLNESS:  Antonio Adams is a 71 y.o. male with multiple medical problems including diabetes mellitus, hyperlipidemia, sleep apnea, right-sided renal cell carcinoma, adenomatous colon polyps, small duodenal carcinoid resected [facility], and adenomatous colon polyps. He also has a history of prostate cancer for which she has undergone radioactive seed implantation and external beam radiation with subsequent radiation proctitis associated with rectal bleeding. He was last evaluated in this office February 2018 regarding right-sided abdominal pain. MR of the abdomen was performed that same month with no acute findings. He presents today with chief complaint of significant rectal bleeding. Passing clots intermittently. His last colonoscopy was performed October 2017. In addition to diminutive adenomas which were removed, he was found to have significant radiation proctitis and internal hemorrhoids. Because of a history of anemia he was placed on iron therapy. Most recent hemoglobin 10/12/2016 was 12.3. Patient denies abdominal pain or other complaints. He is quite concerned. He did undergo follow-up endoscopy with endoscopic ultrasound [facility] April 2018. This was negative. Follow-up in 2 years recommended.  REVIEW OF SYSTEMS:  All non-GI ROS negative except for arthritis, back pain, itching, night sweats, sleeping problems, increased thirst, excessive urination, urinary leakage, shortness of breath  Past Medical History:  Diagnosis Date  . ADENOCARCINOMA, PROSTATE, GLEASON GRADE 6 12/21/2009   prostate cancer  . ALLERGIC RHINITIS 03/16/2007  . Allergy   . Anxiety   . Arthritis    back   . BACK PAIN WITH RADICULOPATHY 11/23/2008  . Cancer of kidney ([place])    partial right kidney removed  . CARPAL TUNNEL SYNDROME, BILATERAL 03/16/2007  . Chronic back pain   . Constipation    takes Senokot daily  . Depression    occasionally  . DIABETES MELLITUS, TYPE II 03/16/2007   takes Januvia  and MEtformin daily  . GASTROINTESTINAL HEMORRHAGE, HX OF 03/16/2007  . GERD 03/16/2007   takes Omeprazole daily  . Glaucoma    mild - no eye drops  . Hemorrhoids   . History of colon polyps   . HYPERLIPIDEMIA 03/16/2007   takes Crestor daily  . HYPERTENSION 03/16/2007   takes Diltiazem and Lisinopril daily   . LEG CRAMPS 05/22/2007  . Nocturia   . Overactive bladder   . PARESTHESIA 05/21/2007  . Proctitis   . Rectal bleeding    Dr.Norins has explained its from the Radiation that he has received  . Sleep apnea    uses CPAP nightly  . Stomach cancer ([place])   . Urinary frequency    takes Toviaz daily    Past Surgical History:  Procedure Laterality Date  . [place], 2008   repeat surgery; ESI '08  . CARPAL TUNNEL RELEASE     bilateral  . CERVICAL FUSION    . COLONOSCOPY    . colonosocpy    . ESOPHAGOGASTRODUODENOSCOPY     ED  . LUMBAR LAMINECTOMY  10/30/2011   Procedure: MICRODISCECTOMY LUMBAR LAMINECTOMY;  Surgeon: Jessy Oto, MD;  Location: [facility];  Service: Orthopedics;  Laterality: Right;  Right L4-5 and L5-S1 Microdiscectomy  . LUMBAR LAMINECTOMY  06/17/2012   Procedure: MICRODISCECTOMY LUMBAR LAMINECTOMY;  Surgeon: Jessy Oto, MD;  Location: [facility];  Service: Orthopedics;  Laterality: N/A;  Right L5-S1 microdiscectomy  . POLYPECTOMY    . PROSTATE SURGERY  march 2012   seed implant  . ROBOTIC ASSITED PARTIAL NEPHRECTOMY Right 09/08/2015   Procedure: XI ROBOTIC ASSITED RIGHT PARTIAL NEPHRECTOMY;  Surgeon: [name], MD;  [facility];  Service: Urology;  Laterality: Right;  Clamp on: 1033 Clamp off: 1103 Total Clamp Time: 30 minutes  . SHOULDER ARTHROSCOPY WITH SUBACROMIAL DECOMPRESSION Left 01/20/2016   Procedure: LEFT SHOULDER ARTHROSCOPY WITH EXTENSIVE  DEBRIDEMENT, ACROMIOPLASTY;  Surgeon: Ninetta Lights, MD;  Location: [facility];  Service: Orthopedics;  Laterality: Left;  . SHOULDER SURGERY  LFT  . Stress Cardiolite  12/24/2003    negative for ischemia  . UPPER ESOPHAGEAL ENDOSCOPIC ULTRASOUND (EUS)  04/18/2016   [facility] hospital  . UPPER GASTROINTESTINAL ENDOSCOPY    . WOUND EXPLORATION     management of a wound that he sustained in the [place]  reports that he has never smoked. He has never used smokeless tobacco. He reports that he drinks alcohol. He reports that he does not use drugs.  family history includes Cancer (age of onset: 61) in his sister; Hypertension in his other.  Allergies  Allergen Reactions  . Ace Inhibitors Swelling       PHYSICAL EXAMINATION: Vital signs: BP 140/76 (BP Location: Left Arm, Patient Position: Sitting, Cuff Size: Normal)   Pulse 68   Ht 5' 10.08" (1.78 m) Comment: height measured without shoes  Wt 222 lb 6 oz (100.9 kg)   BMI 31.84 kg/m   Constitutional: generally well-appearing, no acute distress Psychiatric: Anxious, alert and oriented x3, cooperative Eyes: extraocular movements intact, anicteric, conjunctiva pink Mouth: oral pharynx moist, no lesions Neck: supple without thyromegaly Lymph: no lymphadenopathy Cardiovascular: heart regular rate and rhythm, no murmur Lungs: clear to auscultation bilaterally Abdomen: soft, obese, nontender, nondistended, no obvious ascites, no peritoneal signs, normal bowel sounds, no organomegaly Rectal: Deferred until lower endoscopy Extremities: no clubbing cyanosis or lower extremity edema bilaterally Skin: no lesions on visible extremities Neuro: No focal deficits. Cranial nerves intact  ASSESSMENT:  #1. Persistent worsening rectal bleeding almost certainly secondary to radiation proctitis. #2. History of acute blood loss anemia secondary to the same. Hemoglobin stable on iron therapy #3. History of adenomatous colon polyps. Surveillance up-to-date. Next complete colonoscopy recommended around October 2022 #4. History of small duodenal carcinoid resected endoscopically [facility]. Recent follow-up  endoscopy and EUS negative. They recommend follow-up in 2 years #5. Multiple medical problems    PLAN:  #1. Fairly detailed discussion on etiology and natural history of radiation proctitis. I advised him back to my office to review photographs which we did. #2. Discussed options, including observation with iron replacement. Discussed relative efficacy's and potential side effects different strategies. To this end, we will proceed with flexible sigmoidoscopy with APC ablation of radiation proctitis at the hospital. The patient is high-risk given his age, comorbidities, and the need to address diabetic therapies for his procedure.The nature of the procedure, as well as the risks, benefits, and alternatives were carefully and thoroughly reviewed with the patient. Ample time for discussion and questions allowed. The patient understood, was satisfied, and agreed to proceed. #3. Continue iron therapy daily indefinitely #4. Adjust diabetic medications the day of his procedure to avoid unwanted hypoglycemia #5. History of duodenal carcinoid endoscopically resected and being followed at [facility] #6. Surveillance colonoscopy around 2022 #7. Ongoing general medical care with Dr. Ronnald Ramp

## 2016-11-21 NOTE — Patient Instructions (Signed)
I will contact you to schedule a Flexible Sigmoidoscopy at the hospital.

## 2016-11-22 ENCOUNTER — Telehealth: Payer: Self-pay

## 2016-11-22 DIAGNOSIS — K627 Radiation proctitis: Secondary | ICD-10-CM

## 2016-11-22 NOTE — Telephone Encounter (Signed)
Spoke with patient and told him I had scheduled his Flex Sig at [facility] on 01/08/2017 at 12:00pm.  I told him I would mail him his instructions and he should call me with any questions.  Patient agreed.

## 2016-11-29 ENCOUNTER — Encounter: Payer: Self-pay | Admitting: Internal Medicine

## 2016-12-26 DIAGNOSIS — Z85528 Personal history of other malignant neoplasm of kidney: Secondary | ICD-10-CM | POA: Diagnosis not present

## 2016-12-26 DIAGNOSIS — N2889 Other specified disorders of kidney and ureter: Secondary | ICD-10-CM | POA: Diagnosis not present

## 2017-01-02 DIAGNOSIS — Z85528 Personal history of other malignant neoplasm of kidney: Secondary | ICD-10-CM | POA: Diagnosis not present

## 2017-01-03 ENCOUNTER — Other Ambulatory Visit: Payer: Self-pay | Admitting: Internal Medicine

## 2017-01-04 ENCOUNTER — Encounter (HOSPITAL_COMMUNITY): Payer: Self-pay | Admitting: *Deleted

## 2017-01-04 NOTE — Progress Notes (Signed)
Pt denies SOB, chest pain, and being under the care of a cardiologist. Pt denies having a cardiac cath and echo. Pt made aware to stop taking  Aspirin, vitamins, fish oil and herbal medications. Do not take any NSAIDs ie: Ibuprofen, Advil, Naproxen (Aleve), Motrin, BC and Goody Powder or any medication containing Aspirin. Pt stated that he knows to take only a half dose of Lantus insulin the night before procedure. Pt refuses to take any medications the morning of procedure despite being educated on the importance of taking Diltiazem Pt made aware to check BG every 2 hours prior to arrival to hospital on DOS. Pt made aware to treat a BG < 70 with 4 glucose tabs, wait 15 minutes after intervention to recheck BG, if BG remains < 70, call Short Stay unit to speak with a nurse. Pt verbalized understanding of all pre-op instructions.

## 2017-01-08 ENCOUNTER — Ambulatory Visit (HOSPITAL_COMMUNITY)
Admission: RE | Admit: 2017-01-08 | Discharge: 2017-01-08 | Disposition: A | Discharge status: Home / Self Care | Payer: Medicare Other | Source: Ambulatory Visit | Attending: Internal Medicine | Admitting: Internal Medicine

## 2017-01-08 ENCOUNTER — Ambulatory Visit (HOSPITAL_COMMUNITY): Payer: Medicare Other | Admitting: Certified Registered"

## 2017-01-08 ENCOUNTER — Encounter (HOSPITAL_COMMUNITY): Payer: Self-pay | Admitting: *Deleted

## 2017-01-08 ENCOUNTER — Encounter (HOSPITAL_COMMUNITY)
Admission: RE | Disposition: A | Discharge status: Home / Self Care | Payer: Self-pay | Source: Ambulatory Visit | Attending: Internal Medicine

## 2017-01-08 DIAGNOSIS — F418 Other specified anxiety disorders: Secondary | ICD-10-CM | POA: Insufficient documentation

## 2017-01-08 DIAGNOSIS — K921 Melena: Secondary | ICD-10-CM | POA: Diagnosis not present

## 2017-01-08 DIAGNOSIS — D62 Acute posthemorrhagic anemia: Secondary | ICD-10-CM | POA: Insufficient documentation

## 2017-01-08 DIAGNOSIS — Z8546 Personal history of malignant neoplasm of prostate: Secondary | ICD-10-CM | POA: Insufficient documentation

## 2017-01-08 DIAGNOSIS — K5521 Angiodysplasia of colon with hemorrhage: Secondary | ICD-10-CM | POA: Diagnosis not present

## 2017-01-08 DIAGNOSIS — Z85028 Personal history of other malignant neoplasm of stomach: Secondary | ICD-10-CM | POA: Insufficient documentation

## 2017-01-08 DIAGNOSIS — E119 Type 2 diabetes mellitus without complications: Secondary | ICD-10-CM | POA: Insufficient documentation

## 2017-01-08 DIAGNOSIS — G5603 Carpal tunnel syndrome, bilateral upper limbs: Secondary | ICD-10-CM | POA: Diagnosis not present

## 2017-01-08 DIAGNOSIS — Z8249 Family history of ischemic heart disease and other diseases of the circulatory system: Secondary | ICD-10-CM | POA: Diagnosis not present

## 2017-01-08 DIAGNOSIS — Z981 Arthrodesis status: Secondary | ICD-10-CM | POA: Insufficient documentation

## 2017-01-08 DIAGNOSIS — Z86010 Personal history of colonic polyps: Secondary | ICD-10-CM | POA: Insufficient documentation

## 2017-01-08 DIAGNOSIS — G4730 Sleep apnea, unspecified: Secondary | ICD-10-CM | POA: Insufficient documentation

## 2017-01-08 DIAGNOSIS — K648 Other hemorrhoids: Secondary | ICD-10-CM | POA: Insufficient documentation

## 2017-01-08 DIAGNOSIS — N3281 Overactive bladder: Secondary | ICD-10-CM | POA: Insufficient documentation

## 2017-01-08 DIAGNOSIS — F329 Major depressive disorder, single episode, unspecified: Secondary | ICD-10-CM | POA: Insufficient documentation

## 2017-01-08 DIAGNOSIS — K627 Radiation proctitis: Secondary | ICD-10-CM

## 2017-01-08 DIAGNOSIS — E785 Hyperlipidemia, unspecified: Secondary | ICD-10-CM | POA: Insufficient documentation

## 2017-01-08 DIAGNOSIS — K5520 Angiodysplasia of colon without hemorrhage: Secondary | ICD-10-CM | POA: Diagnosis present

## 2017-01-08 DIAGNOSIS — H409 Unspecified glaucoma: Secondary | ICD-10-CM | POA: Diagnosis not present

## 2017-01-08 DIAGNOSIS — I10 Essential (primary) hypertension: Secondary | ICD-10-CM | POA: Insufficient documentation

## 2017-01-08 DIAGNOSIS — K219 Gastro-esophageal reflux disease without esophagitis: Secondary | ICD-10-CM | POA: Diagnosis not present

## 2017-01-08 DIAGNOSIS — G8929 Other chronic pain: Secondary | ICD-10-CM | POA: Diagnosis not present

## 2017-01-08 DIAGNOSIS — Z85528 Personal history of other malignant neoplasm of kidney: Secondary | ICD-10-CM | POA: Insufficient documentation

## 2017-01-08 DIAGNOSIS — M1990 Unspecified osteoarthritis, unspecified site: Secondary | ICD-10-CM | POA: Insufficient documentation

## 2017-01-08 DIAGNOSIS — Z923 Personal history of irradiation: Secondary | ICD-10-CM | POA: Diagnosis not present

## 2017-01-08 DIAGNOSIS — F419 Anxiety disorder, unspecified: Secondary | ICD-10-CM | POA: Insufficient documentation

## 2017-01-08 DIAGNOSIS — Z809 Family history of malignant neoplasm, unspecified: Secondary | ICD-10-CM | POA: Insufficient documentation

## 2017-01-08 DIAGNOSIS — Z905 Acquired absence of kidney: Secondary | ICD-10-CM | POA: Insufficient documentation

## 2017-01-08 DIAGNOSIS — Z888 Allergy status to other drugs, medicaments and biological substances status: Secondary | ICD-10-CM | POA: Insufficient documentation

## 2017-01-08 HISTORY — PX: FLEXIBLE SIGMOIDOSCOPY: SHX5431

## 2017-01-08 HISTORY — PX: HOT HEMOSTASIS: SHX5433

## 2017-01-08 LAB — GLUCOSE, CAPILLARY: GLUCOSE-CAPILLARY: 114 mg/dL — AB (ref 65–99)

## 2017-01-08 SURGERY — SIGMOIDOSCOPY, FLEXIBLE
Anesthesia: Monitor Anesthesia Care

## 2017-01-08 MED ORDER — PROPOFOL 500 MG/50ML IV EMUL
INTRAVENOUS | Status: DC | PRN
  Administered 2017-01-08: 100 ug/kg/min via INTRAVENOUS

## 2017-01-08 MED ORDER — PROPOFOL 10 MG/ML IV BOLUS
INTRAVENOUS | Status: DC | PRN
  Administered 2017-01-08 (×2): 20 mg via INTRAVENOUS

## 2017-01-08 MED ORDER — SODIUM CHLORIDE 0.9 % IV SOLN
INTRAVENOUS | Status: DC
  Administered 2017-01-08: 11:00:00 via INTRAVENOUS

## 2017-01-08 NOTE — H&P (Signed)
HISTORY OF PRESENT ILLNESS:  Antonio Adams is a 71 y.o. male with multiple medical problems including diabetes mellitus, hyperlipidemia, sleep apnea, right-sided renal cell carcinoma, adenomatous colon polyps, small duodenal carcinoid resected [facility], and adenomatous colon polyps. He also has a history of prostate cancer for which she has undergone radioactive seed implantation and external beam radiation with subsequent radiation proctitis associated with rectal bleeding. He was last evaluated in this office February 2018 regarding right-sided abdominal pain. MR of the abdomen was performed that same month with no acute findings. He presents today with chief complaint of significant rectal bleeding. Passing clots intermittently. His last colonoscopy was performed October 2017. In addition to diminutive adenomas which were removed, he was found to have significant radiation proctitis and internal hemorrhoids. Because of a history of anemia he was placed on iron therapy. Most recent hemoglobin 10/12/2016 was 12.3. Patient denies abdominal pain or other complaints. He is quite concerned. He did undergo follow-up endoscopy with endoscopic ultrasound [facility] April 2018. This was negative. Follow-up in 2 years recommended.  REVIEW OF SYSTEMS:  All non-GI ROS negative except for arthritis, back pain, itching, night sweats, sleeping problems, increased thirst, excessive urination, urinary leakage, shortness of breath      Past Medical History:  Diagnosis Date  . ADENOCARCINOMA, PROSTATE, GLEASON GRADE 6 12/21/2009   prostate cancer  . ALLERGIC RHINITIS 03/16/2007  . Allergy   . Anxiety   . Arthritis    back   . BACK PAIN WITH RADICULOPATHY 11/23/2008  . Cancer of kidney ([place])    partial right kidney removed  . CARPAL TUNNEL SYNDROME, BILATERAL 03/16/2007  . Chronic back pain   . Constipation    takes Senokot daily  . Depression    occasionally  . DIABETES MELLITUS, TYPE II  03/16/2007   takes Januvia and MEtformin daily  . GASTROINTESTINAL HEMORRHAGE, HX OF 03/16/2007  . GERD 03/16/2007   takes Omeprazole daily  . Glaucoma    mild - no eye drops  . Hemorrhoids   . History of colon polyps   . HYPERLIPIDEMIA 03/16/2007   takes Crestor daily  . HYPERTENSION 03/16/2007   takes Diltiazem and Lisinopril daily   . LEG CRAMPS 05/22/2007  . Nocturia   . Overactive bladder   . PARESTHESIA 05/21/2007  . Proctitis   . Rectal bleeding    Dr.Norins has explained its from the Radiation that he has received  . Sleep apnea    uses CPAP nightly  . Stomach cancer ([place])   . Urinary frequency    takes Toviaz daily         Past Surgical History:  Procedure Laterality Date  . [place], 2008   repeat surgery; ESI '08  . CARPAL TUNNEL RELEASE     bilateral  . CERVICAL FUSION    . COLONOSCOPY    . colonosocpy    . ESOPHAGOGASTRODUODENOSCOPY     ED  . LUMBAR LAMINECTOMY  10/30/2011   Procedure: MICRODISCECTOMY LUMBAR LAMINECTOMY;  Surgeon: Jessy Oto, MD;  Location: [place];  Service: Orthopedics;  Laterality: Right;  Right L4-5 and L5-S1 Microdiscectomy  . LUMBAR LAMINECTOMY  06/17/2012   Procedure: MICRODISCECTOMY LUMBAR LAMINECTOMY;  Surgeon: Jessy Oto, MD;  Location: [facility];  Service: Orthopedics;  Laterality: N/A;  Right L5-S1 microdiscectomy  . POLYPECTOMY    . PROSTATE SURGERY  march 2012   seed implant  . ROBOTIC ASSITED PARTIAL NEPHRECTOMY Right 09/08/2015   Procedure: XI ROBOTIC ASSITED  RIGHT PARTIAL NEPHRECTOMY;  Surgeon: [name], MD;  [facility];  Service: Urology;  Laterality: Right;  Clamp on: 1033 Clamp off: 1103 Total Clamp Time: 30 minutes  . SHOULDER ARTHROSCOPY WITH SUBACROMIAL DECOMPRESSION Left 01/20/2016   Procedure: LEFT SHOULDER ARTHROSCOPY WITH EXTENSIVE  DEBRIDEMENT, ACROMIOPLASTY;  Surgeon: Ninetta Lights, MD;  Location: [place];  Service: Orthopedics;   Laterality: Left;  . SHOULDER SURGERY  LFT  . Stress Cardiolite  12/24/2003   negative for ischemia  . UPPER ESOPHAGEAL ENDOSCOPIC ULTRASOUND (EUS)  04/18/2016   [facility] hospital  . UPPER GASTROINTESTINAL ENDOSCOPY    . WOUND EXPLORATION     management of a wound that he sustained in the [place]  reports that he has never smoked. He has never used smokeless tobacco. He reports that he drinks alcohol. He reports that he does not use drugs.  family history includes Cancer (age of onset: 75) in his sister; Hypertension in his other.      Allergies  Allergen Reactions  . Ace Inhibitors Swelling       PHYSICAL EXAMINATION: Vital signs: BP 140/76 (BP Location: Left Arm, Patient Position: Sitting, Cuff Size: Normal)   Pulse 68   Ht 5' 10.08" (1.78 m) Comment: height measured without shoes  Wt 222 lb 6 oz (100.9 kg)   BMI 31.84 kg/m   Constitutional: generally well-appearing, no acute distress Psychiatric: Anxious, alert and oriented x3, cooperative Eyes: extraocular movements intact, anicteric, conjunctiva pink Mouth: oral pharynx moist, no lesions Neck: supple without thyromegaly Lymph: no lymphadenopathy Cardiovascular: heart regular rate and rhythm, no murmur Lungs: clear to auscultation bilaterally Abdomen: soft, obese, nontender, nondistended, no obvious ascites, no peritoneal signs, normal bowel sounds, no organomegaly Rectal: Deferred until lower endoscopy Extremities: no clubbing cyanosis or lower extremity edema bilaterally Skin: no lesions on visible extremities Neuro: No focal deficits. Cranial nerves intact  ASSESSMENT:  #1. Persistent worsening rectal bleeding almost certainly secondary to radiation proctitis. #2. History of acute blood loss anemia secondary to the same. Hemoglobin stable on iron therapy #3. History of adenomatous colon polyps. Surveillance up-to-date. Next complete colonoscopy recommended  around October 2022 #4. History of small duodenal carcinoid resected endoscopically [facility]. Recent follow-up endoscopy and EUS negative. They recommend follow-up in 2 years #5. Multiple medical problems    PLAN:  #1. Fairly detailed discussion on etiology and natural history of radiation proctitis. I advised him back to my office to review photographs which we did. #2. Discussed options, including observation with iron replacement. Discussed relative efficacy's and potential side effects different strategies. To this end, we will proceed with flexible sigmoidoscopy with APC ablation of radiation proctitis at the hospital. The patient is high-risk given his age, comorbidities, and the need to address diabetic therapies for his procedure.The nature of the procedure, as well as the risks, benefits, and alternatives were carefully and thoroughly reviewed with the patient. Ample time for discussion and questions allowed. The patient understood, was satisfied, and agreed to proceed. #3. Continue iron therapy daily indefinitely #4. Adjust diabetic medications the day of his procedure to avoid unwanted hypoglycemia #5. History of duodenal carcinoid endoscopically resected and being followed at [facility] #6. Surveillance colonoscopy around 2022 #7. Ongoing general medical care with Dr. Ronnald Ramp  ADDENDUM No interval clinical change from recent office note as outlined above. Patient for flexible sigmoidoscopy with APC for chronic bleeding secondary to radiation proctitis.The nature of the procedure, as well as the  risks, benefits, and alternatives were carefully and thoroughly reviewed with the patient. Ample time for discussion and questions allowed. The patient understood, was satisfied, and agreed to proceed. Docia Chuck. Geri Seminole., M.D. [facility] Division of Gastroenterology

## 2017-01-08 NOTE — Anesthesia Procedure Notes (Signed)
Procedure Name: MAC Date/Time: 01/08/2017 12:19 PM Performed by: Barrington Ellison Pre-anesthesia Checklist: Patient identified, Emergency Drugs available, Suction available, Patient being monitored and Timeout performed Patient Re-evaluated:Patient Re-evaluated prior to induction Oxygen Delivery Method: Nasal cannula

## 2017-01-08 NOTE — Anesthesia Postprocedure Evaluation (Signed)
Anesthesia Post Note  Patient: Antonio Adams  Procedure(s) Performed: Procedure(s) (LRB): FLEXIBLE SIGMOIDOSCOPY (N/A) HOT HEMOSTASIS (ARGON PLASMA COAGULATION/BICAP) (N/A)     Patient location during evaluation: PACU Anesthesia Type: MAC Level of consciousness: awake and alert Pain management: pain level controlled Vital Signs Assessment: post-procedure vital signs reviewed and stable Respiratory status: spontaneous breathing, nonlabored ventilation, respiratory function stable and patient connected to nasal cannula oxygen Cardiovascular status: stable and blood pressure returned to baseline Postop Assessment: no signs of nausea or vomiting Anesthetic complications: no    Last Vitals:  Vitals:   01/08/17 1245 01/08/17 1300  BP: (!) 143/76 (!) 152/83  Pulse: (!) 58   Resp: (!) 24 20  Temp: 36.6 C   SpO2: 99% 100%    Last Pain:  Vitals:   01/08/17 1245  TempSrc: Oral                 Huldah Marin S

## 2017-01-08 NOTE — Discharge Instructions (Signed)
YOU HAD AN ENDOSCOPIC PROCEDURE TODAY: Refer to the procedure report and other information in the discharge instructions given to you for any specific questions about what was found during the examination. If this information does not answer your questions, please call [place] office at [phone] to clarify.  ° °YOU SHOULD EXPECT: Some feelings of bloating in the abdomen. Passage of more gas than usual. Walking can help get rid of the air that was put into your GI tract during the procedure and reduce the bloating. If you had a lower endoscopy (such as a colonoscopy or flexible sigmoidoscopy) you may notice spotting of blood in your stool or on the toilet paper. Some abdominal soreness may be present for a day or two, also. ° °DIET: Your first meal following the procedure should be a light meal and then it is ok to progress to your normal diet. A half-sandwich or bowl of soup is an example of a good first meal. Heavy or fried foods are harder to digest and may make you feel nauseous or bloated. Drink plenty of fluids but you should avoid alcoholic beverages for 24 hours. If you had a esophageal dilation, please see attached instructions for diet.   ° °ACTIVITY: Your care partner should take you home directly after the procedure. You should plan to take it easy, moving slowly for the rest of the day. You can resume normal activity the day after the procedure however YOU SHOULD NOT DRIVE, use power tools, machinery or perform tasks that involve climbing or major physical exertion for 24 hours (because of the sedation medicines used during the test).  ° °SYMPTOMS TO REPORT IMMEDIATELY: °A gastroenterologist can be reached at any hour. Please call [phone]  for any of the following symptoms:  °Following lower endoscopy (colonoscopy, flexible sigmoidoscopy) °Excessive amounts of blood in the stool  °Significant tenderness, worsening of abdominal pains  °Swelling of the abdomen that is new, acute  °Fever of 100° or  higher  °Following upper endoscopy (EGD, EUS, ERCP, esophageal dilation) °Vomiting of blood or coffee ground material  °New, significant abdominal pain  °New, significant chest pain or pain under the shoulder blades  °Painful or persistently difficult swallowing  °New shortness of breath  °Black, tarry-looking or red, bloody stools ° °FOLLOW UP:  °If any biopsies were taken you will be contacted by phone or by letter within the next 1-3 weeks. Call [phone]  if you have not heard about the biopsies in 3 weeks.  °Please also call with any specific questions about appointments or follow up tests. ° °

## 2017-01-08 NOTE — Anesthesia Preprocedure Evaluation (Addendum)
Anesthesia Evaluation  Patient identified by MRN, date of birth, ID band Patient awake    Reviewed: Allergy & Precautions, H&P , NPO status , Patient's Chart, lab work & pertinent test results  Airway Mallampati: II   Neck ROM: full    Dental  (+) Teeth Intact, Dental Advisory Given   Pulmonary sleep apnea ,    breath sounds clear to auscultation       Cardiovascular hypertension,  Rhythm:regular Rate:Normal     Neuro/Psych PSYCHIATRIC DISORDERS Anxiety Depression    GI/Hepatic GERD  ,  Endo/Other  diabetes, Type 2  Renal/GU Renal disease     Musculoskeletal  (+) Arthritis ,   Abdominal   Peds  Hematology   Anesthesia Other Findings   Reproductive/Obstetrics                            Anesthesia Physical Anesthesia Plan  ASA: III  Anesthesia Plan: MAC   Post-op Pain Management:    Induction: Intravenous  PONV Risk Score and Plan: 1 and Ondansetron, Propofol infusion and Treatment may vary due to age or medical condition  Airway Management Planned: Simple Face Mask  Additional Equipment:   Intra-op Plan:   Post-operative Plan:   Informed Consent: I have reviewed the patients History and Physical, chart, labs and discussed the procedure including the risks, benefits and alternatives for the proposed anesthesia with the patient or authorized representative who has indicated his/her understanding and acceptance.     Plan Discussed with: CRNA and Anesthesiologist  Anesthesia Plan Comments:         Anesthesia Quick Evaluation

## 2017-01-08 NOTE — Op Note (Signed)
[facility] Patient Name: Antonio Adams Procedure Date : 01/08/2017 MRN: [id number] Attending MD: Docia Chuck. Henrene Pastor , MD Date of Birth: August 05, 1945 CSN: [id number] Age: 71 Admit Type: Outpatient Procedure:                Flexible Sigmoidoscopy, with control of bleeding                            (APC) Indications:              Hematochezia. Known radiation proctitis Providers:                Docia Chuck. Henrene Pastor, MD, Cleda Daub, RN, Elspeth Cho Tech., Technician, Sampson Si, CRNA Referring MD:              Medicines:                Monitored Anesthesia Care Complications:            No immediate complications. Estimated Blood Loss:     Estimated blood loss: none. Procedure:                Pre-Anesthesia Assessment:                           - Prior to the procedure, a History and Physical                            was performed, and patient medications and                            allergies were reviewed. The patient's tolerance of                            previous anesthesia was also reviewed. The risks                            and benefits of the procedure and the sedation                            options and risks were discussed with the patient.                            All questions were answered, and informed consent                            was obtained. Prior Anticoagulants: The patient has                            taken no previous anticoagulant or antiplatelet                            agents. ASA Grade Assessment: III - A patient with  severe systemic disease. After reviewing the risks                            and benefits, the patient was deemed in                            satisfactory condition to undergo the procedure.                           After obtaining informed consent, the scope was                            passed under direct vision. The [id number] ([id number])              scope was introduced through the anus and advanced                            to the the sigmoid colon. The flexible                            sigmoidoscopy was accomplished without difficulty.                            The patient tolerated the procedure well. The                            quality of the bowel preparation was excellent. Scope In: Scope Out: Findings:      The perianal and digital rectal examinations were normal.      Multiple angioectasias with bleeding were found in the distal rectum       consistent with radiation proctitis. This involved the distal several       centimeters an approximately 25% of the circumference. There was active       oozing and clots present. Coagulation for hemostasis using argon plasma       was successful. See images. Estimated blood loss: none. Impression:               - Multiple bleeding colonic angioectasias. Treated                            with argon plasma coagulation (APC).                           - No specimens collected. Recommendation:           - Patient has a contact number available for                            emergencies. The signs and symptoms of potential                            delayed complications were discussed with the                            patient. Return to normal activities tomorrow.  Written discharge instructions were provided to the                            patient.                           - Resume previous diet.                           - Iron supplement once daily                           - Office follow-up with Dr. Henrene Pastor in about 3 months Procedure Code(s):        --- Professional ---                           [phone], Sigmoidoscopy, flexible; with control of                            bleeding, any method Diagnosis Code(s):        --- Professional ---                           K55.21, Angiodysplasia of colon with hemorrhage                            K92.1, Melena (includes Hematochezia) CPT copyright 2016 [employer]. All rights reserved. The codes documented in this report are preliminary and upon coder review may  be revised to meet current compliance requirements. Docia Chuck. Henrene Pastor, MD 01/08/2017 12:43:11 PM This report has been signed electronically. Number of Addenda: 0

## 2017-01-08 NOTE — Transfer of Care (Signed)
Immediate Anesthesia Transfer of Care Note  Patient: JUVENTINO PAVONE  Procedure(s) Performed: Procedure(s): FLEXIBLE SIGMOIDOSCOPY (N/A) HOT HEMOSTASIS (ARGON PLASMA COAGULATION/BICAP) (N/A)  Patient Location: Endoscopy Unit  Anesthesia Type:MAC  Level of Consciousness: drowsy and patient cooperative  Airway & Oxygen Therapy: Patient Spontanous Breathing  Post-op Assessment: Report given to RN  Post vital signs: Reviewed and stable  Last Vitals:  Vitals:   01/08/17 1055  BP: (!) 170/90  Pulse: 61  Resp: (!) 22  Temp: 36.9 C  SpO2: 100%    Last Pain:  Vitals:   01/08/17 1055  TempSrc: Oral         Complications: No apparent anesthesia complications

## 2017-01-09 ENCOUNTER — Encounter (HOSPITAL_COMMUNITY): Payer: Self-pay | Admitting: Internal Medicine

## 2017-01-30 LAB — HM DIABETES EYE EXAM

## 2017-03-01 IMAGING — CR DG CHEST 2V
3 series · 3 of 3 positions shown · non-contrast
Comparison: September 15, 2015

CLINICAL DATA: Right renal neoplasm.  Hypertension.

EXAM:
CHEST  2 VIEW

[w chest pa]
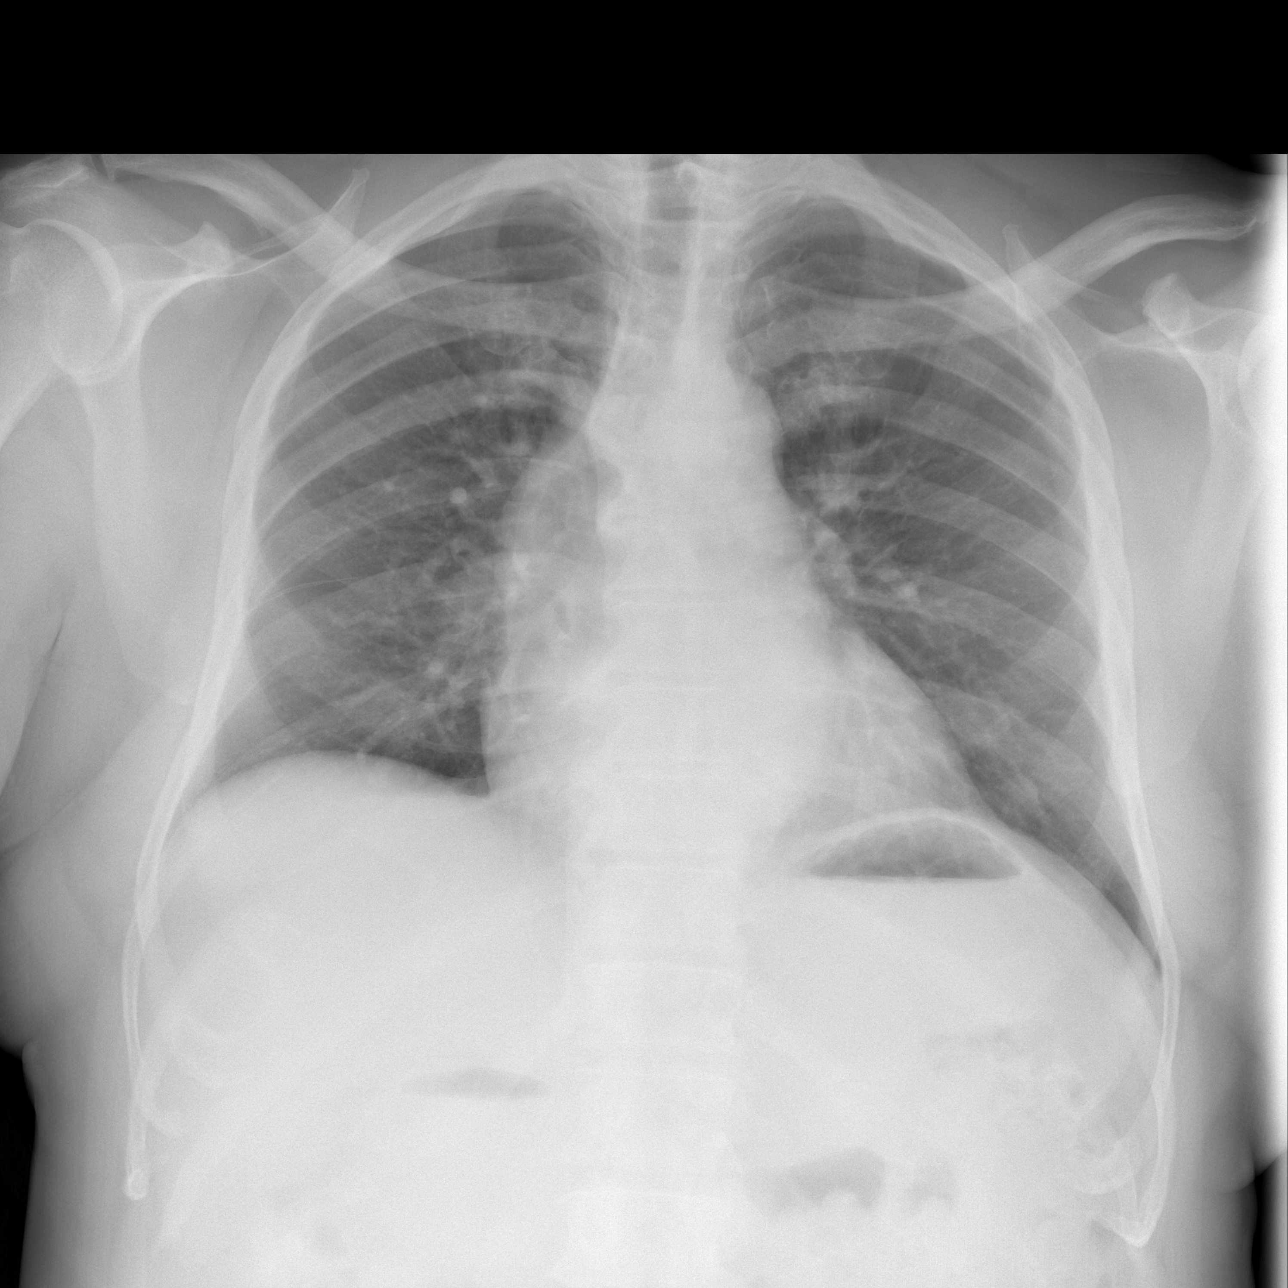

[w chest lat (1 of 2)]
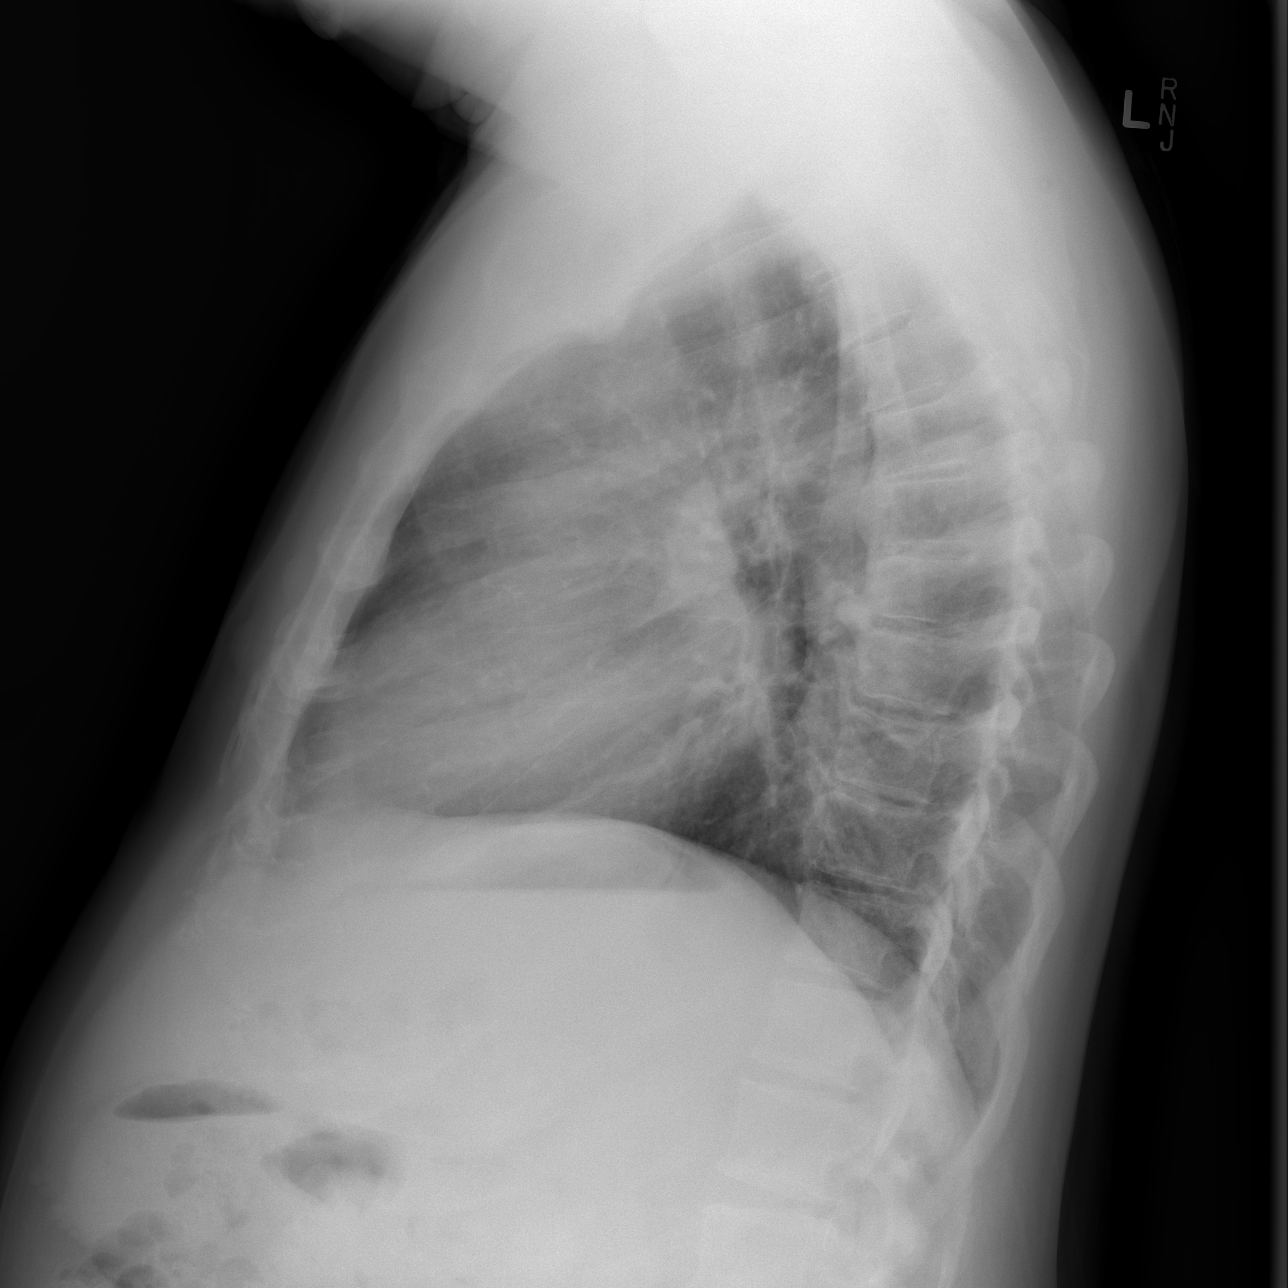

[w chest lat (2 of 2)]
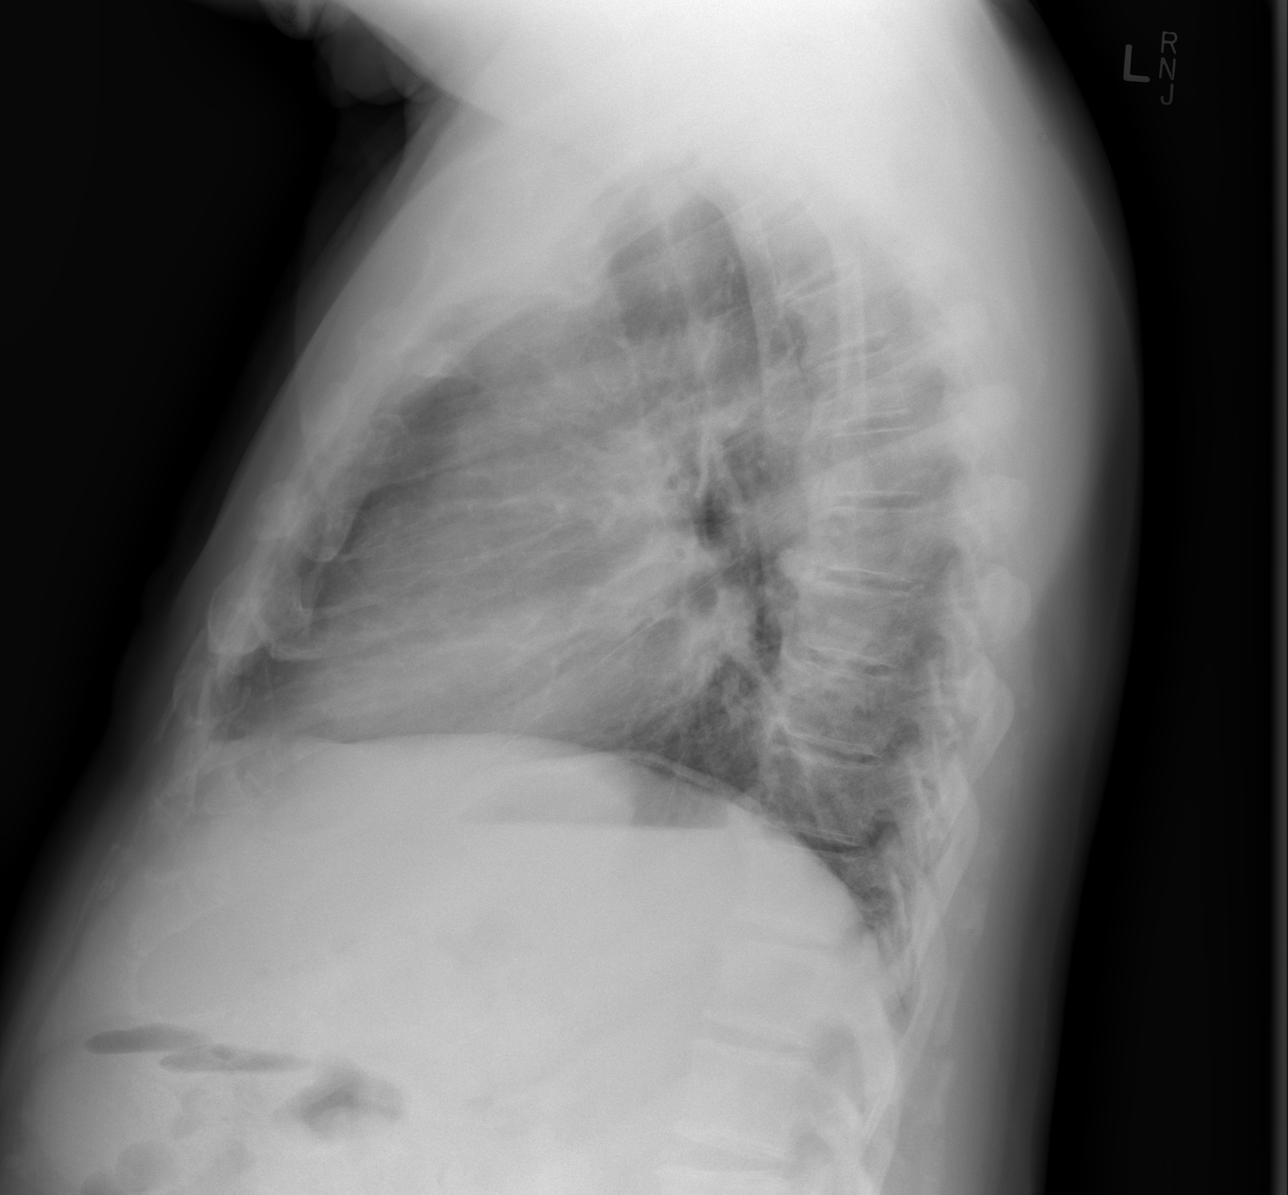

[3 of 3 positions shown; findings below may reference images not displayed]

FINDINGS: There is no edema or consolidation. The heart size and pulmonary
vascularity are normal. No demonstrable mass or adenopathy. There is
postoperative change in the lower cervical region. There is
degenerative change in the thoracic spine. No blastic or lytic
lesions.
IMPRESSION: No edema or consolidation.  No evident adenopathy.

## 2017-03-05 ENCOUNTER — Other Ambulatory Visit: Payer: Self-pay | Admitting: Internal Medicine

## 2017-03-11 ENCOUNTER — Other Ambulatory Visit: Payer: Self-pay | Admitting: Internal Medicine

## 2017-03-11 DIAGNOSIS — D508 Other iron deficiency anemias: Secondary | ICD-10-CM

## 2017-04-03 DIAGNOSIS — M546 Pain in thoracic spine: Secondary | ICD-10-CM | POA: Diagnosis not present

## 2017-04-03 DIAGNOSIS — C641 Malignant neoplasm of right kidney, except renal pelvis: Secondary | ICD-10-CM | POA: Diagnosis not present

## 2017-04-10 DIAGNOSIS — M25512 Pain in left shoulder: Secondary | ICD-10-CM | POA: Diagnosis not present

## 2017-04-10 DIAGNOSIS — M542 Cervicalgia: Secondary | ICD-10-CM | POA: Diagnosis not present

## 2017-05-11 ENCOUNTER — Ambulatory Visit (INDEPENDENT_AMBULATORY_CARE_PROVIDER_SITE_OTHER): Payer: Medicare Other | Admitting: Specialist

## 2017-05-21 ENCOUNTER — Encounter: Payer: Self-pay | Admitting: Internal Medicine

## 2017-05-21 ENCOUNTER — Ambulatory Visit (INDEPENDENT_AMBULATORY_CARE_PROVIDER_SITE_OTHER): Payer: Medicare Other | Admitting: Internal Medicine

## 2017-05-21 VITALS — BP 160/88 | HR 77 | Temp 98.1°F | Resp 16 | Ht 71.0 in | Wt 222.2 lb

## 2017-05-21 DIAGNOSIS — I10 Essential (primary) hypertension: Secondary | ICD-10-CM

## 2017-05-21 DIAGNOSIS — R413 Other amnesia: Secondary | ICD-10-CM | POA: Diagnosis not present

## 2017-05-21 DIAGNOSIS — E785 Hyperlipidemia, unspecified: Secondary | ICD-10-CM

## 2017-05-21 DIAGNOSIS — D539 Nutritional anemia, unspecified: Secondary | ICD-10-CM | POA: Diagnosis not present

## 2017-05-21 DIAGNOSIS — Z794 Long term (current) use of insulin: Secondary | ICD-10-CM

## 2017-05-21 DIAGNOSIS — H9190 Unspecified hearing loss, unspecified ear: Secondary | ICD-10-CM | POA: Diagnosis not present

## 2017-05-21 DIAGNOSIS — E118 Type 2 diabetes mellitus with unspecified complications: Secondary | ICD-10-CM

## 2017-05-21 DIAGNOSIS — H6123 Impacted cerumen, bilateral: Secondary | ICD-10-CM | POA: Insufficient documentation

## 2017-05-21 LAB — POCT GLYCOSYLATED HEMOGLOBIN (HGB A1C): Hemoglobin A1C: 7.4

## 2017-05-21 MED ORDER — AZILSARTAN-CHLORTHALIDONE 40-12.5 MG PO TABS: 1.0000 | ORAL_TABLET | Freq: Every day | ORAL | 0 refills | Status: DC

## 2017-05-21 MED ORDER — ATORVASTATIN CALCIUM 40 MG PO TABS: 40.0000 mg | ORAL_TABLET | Freq: Every day | ORAL | 1 refills | Status: DC

## 2017-05-21 NOTE — Progress Notes (Signed)
Subjective:  Patient ID: Antonio Adams, male    DOB: 1945-08-09  Age: 72 y.o. MRN: [id number]  CC: Hypertension; Hyperlipidemia; and Diabetes   HPI Antonio Adams presents for f/up - He complains that his blood pressure is not well controlled.  He and his wife also complain of worsening forgetfulness and declining memory.  He is still independent in his ADLs.  He thinks his blood sugars have been well controlled.  He is tolerating the statin without any muscle or joint ache.  Outpatient Medications Prior to Visit  Medication Sig Dispense Refill  . aspirin EC 81 MG tablet Take 81 mg by mouth daily.    . canagliflozin (INVOKANA) 100 MG TABS tablet Take 1 tablet (100 mg total) by mouth daily before breakfast. 90 tablet 1  . cholecalciferol (VITAMIN D) 1000 units tablet Take 1,000 Units by mouth daily.    . citalopram (CELEXA) 40 MG tablet Take 1 tablet by mouth daily.    . clotrimazole-betamethasone (LOTRISONE) cream APPLY 1 APPLICATION TOPICALLY 2 (TWO) TIMES DAILY. 30 g 0  . diltiazem (DILACOR XR) 120 MG 24 hr capsule Take 120 mg by mouth daily.      . ferrous sulfate 325 (65 FE) MG tablet TAKE 1 TABLET (325 MG TOTAL) BY MOUTH 2 (TWO) TIMES DAILY. 60 tablet 6  . gabapentin (NEURONTIN) 300 MG capsule Take 300 mg by mouth at bedtime.    . Insulin Glargine (LANTUS SOLOSTAR) 100 UNIT/ML Solostar Pen Inject 70 Units into the skin every morning. 15 mL 3  . Insulin Lispro (HUMALOG KWIKPEN) 200 UNIT/ML SOPN Inject 5 Units into the skin 3 (three) times daily after meals. 3 mL 11  . Insulin Pen Needle (NOVOFINE) 32G X 6 MM MISC 1 Act by Does not apply route daily. USE TID 100 each 11  . JANUMET XR 50-1000 MG TB24 TAKE 1 TABLET BY MOUTH DAILY. 30 tablet 3  . Needles & Syringes MISC Use syringe and needle to inject insulin into the skin as directed on insulin prescription. DX E11.09 90 each 3  . NOVOFINE PLUS 32G X 4 MM MISC USE DAILY WITH INSULIN 100 each 2  . omeprazole (PRILOSEC) 40 MG  capsule TAKE ONE CAPSULE EVERY DAY 30 capsule 10  . ONE TOUCH ULTRA TEST test strip USE 2 (TWO) TIMES DAILY. AND LANCETS 2/DAY 250.03 100 each 11  . sertraline (ZOLOFT) 100 MG tablet Take 1 tablet by mouth daily. Reported on 08/06/2015    . tamsulosin (FLOMAX) 0.4 MG CAPS capsule TAKE 1 CAPSULE (0.4 MG TOTAL) BY MOUTH DAILY. 30 capsule 1  . triamcinolone (NASACORT) 55 MCG/ACT AERO nasal inhaler Place 2 sprays into the nose daily. 32.4 mL 3  . zolpidem (AMBIEN) 10 MG tablet Take 10 mg by mouth at bedtime as needed for sleep.     Marland Kitchen atorvastatin (LIPITOR) 20 MG tablet Take 1 tablet (20 mg total) by mouth daily. (Patient taking differently: Take 20 mg by mouth at bedtime. ) 90 tablet 3  . gemfibrozil (LOPID) 600 MG tablet Take 600 mg by mouth 2 (two) times daily.     Marland Kitchen telmisartan (MICARDIS) 40 MG tablet Take 1 tablet (40 mg total) by mouth daily. 90 tablet 1   No facility-administered medications prior to visit.     ROS Review of Systems  Constitutional: Negative for appetite change, diaphoresis, fatigue and unexpected weight change.  HENT: Positive for hearing loss. Negative for ear pain.   Eyes: Negative for visual disturbance.  Respiratory:  Negative for cough, chest tightness, shortness of breath and wheezing.   Cardiovascular: Negative for chest pain, palpitations and leg swelling.  Gastrointestinal: Negative for abdominal pain, constipation, diarrhea and vomiting.  Endocrine: Negative.  Negative for polydipsia, polyphagia and polyuria.  Genitourinary: Negative.  Negative for difficulty urinating.  Musculoskeletal: Negative for back pain, myalgias and neck pain.  Skin: Negative.  Negative for color change and rash.  Neurological: Negative.  Negative for dizziness, weakness and light-headedness.  Hematological: Negative.  Negative for adenopathy. Does not bruise/bleed easily.  Psychiatric/Behavioral: Positive for confusion and decreased concentration. Negative for agitation, behavioral  problems, dysphoric mood, self-injury, sleep disturbance and suicidal ideas. The patient is not nervous/anxious.     Objective:  BP (!) 160/88 (BP Location: Left Arm, Patient Position: Sitting, Cuff Size: Large)   Pulse 77   Temp 98.1 F (36.7 C) (Oral)   Resp 16   Ht 5\' 11"  (1.803 m)   Wt 222 lb 4 oz (100.8 kg)   SpO2 97%   BMI 31.00 kg/m   BP Readings from Last 3 Encounters:  05/21/17 (!) 160/88  01/08/17 (!) 152/83  11/21/16 140/76    Wt Readings from Last 3 Encounters:  05/21/17 222 lb 4 oz (100.8 kg)  01/08/17 222 lb (100.7 kg)  11/21/16 222 lb 6 oz (100.9 kg)    Physical Exam  Constitutional: He is oriented to person, place, and time. No distress.  HENT:  Mouth/Throat: Oropharynx is clear and moist. No oropharyngeal exudate.  Eyes: Conjunctivae are normal. Left eye exhibits no discharge. No scleral icterus.  Neck: Normal range of motion. Neck supple. No thyromegaly present.  Cardiovascular: Normal rate, regular rhythm and normal heart sounds.  No murmur heard. Pulmonary/Chest: Effort normal and breath sounds normal. He has no wheezes. He has no rales.  Abdominal: Soft. Bowel sounds are normal. He exhibits no mass. There is no tenderness. There is no guarding.  Musculoskeletal: Normal range of motion. He exhibits no edema, tenderness or deformity.  Lymphadenopathy:    He has no cervical adenopathy.  Neurological: He is alert and oriented to person, place, and time.  Skin: Skin is warm and dry. No rash noted. He is not diaphoretic. No erythema. No pallor.  Psychiatric: His behavior is normal. Judgment normal. His mood appears not anxious. His speech is delayed. His speech is not rapid and/or pressured and not tangential. He is not agitated. Thought content is not paranoid. Cognition and memory are impaired. He does not exhibit a depressed mood. He expresses no homicidal and no suicidal ideation. He exhibits abnormal recent memory and abnormal remote memory.  Vitals  reviewed.   Lab Results  Component Value Date   WBC 4.2 10/12/2016   HGB 12.3 (L) 10/12/2016   HCT 36.1 (L) 10/12/2016   PLT 214.0 10/12/2016   GLUCOSE 228 (H) 11/01/2016   CHOL 159 07/25/2016   TRIG 84.0 07/25/2016   HDL 28.30 (L) 07/25/2016   LDLCALC 114 (H) 07/25/2016   ALT 15 07/25/2016   AST 14 07/25/2016   NA 131 (L) 11/01/2016   K 4.1 11/01/2016   CL 98 11/01/2016   CREATININE 1.81 (H) 11/01/2016   BUN 27 (H) 11/01/2016   CO2 24 11/01/2016   TSH 0.67 06/14/2015   PSA 0.046 08/25/2016   INR 1.02 07/14/2010   HGBA1C 7.4 05/21/2017   MICROALBUR 118.0 (H) 07/25/2016    No results found.  Assessment & Plan:   Antonio Adams was seen today for hypertension, hyperlipidemia and diabetes.  Diagnoses  and all orders for this visit:  Deficiency anemia- I will recheck his H&H and if his anemia has not improved then I will investigate this further. -     CBC with Differential/Platelet; Future  Type 2 diabetes mellitus with complication, with long-term current use of insulin ([place])- His A1c is at 7.4%.  His blood sugars are adequately well controlled. -     Azilsartan-Chlorthalidone (EDARBYCLOR) 40-12.5 MG TABS; Take 1 tablet by mouth daily. -     Basic metabolic panel; Future -     POCT glycosylated hemoglobin (Hb A1C)  Essential hypertension- His blood pressure is not adequately well controlled.  Will upgrade to a more potent ARB and will add a thiazide diuretic. -     Azilsartan-Chlorthalidone (EDARBYCLOR) 40-12.5 MG TABS; Take 1 tablet by mouth daily. -     Basic metabolic panel; Future  Hyperlipidemia with target LDL less than 100- He is tolerating the statin well.  We will check his LDL to see if he is achieved his goal. -     atorvastatin (LIPITOR) 40 MG tablet; Take 1 tablet (40 mg total) by mouth daily.  Hearing loss, unspecified hearing loss type, unspecified laterality -     Ambulatory referral to Audiology  Memory difficulty -     Ambulatory referral to  Neurology   I have discontinued Otila Back. Henrikson's gemfibrozil, atorvastatin, and telmisartan. I am also having him start on Azilsartan-Chlorthalidone and atorvastatin. Additionally, I am having him maintain his diltiazem, zolpidem, sertraline, omeprazole, tamsulosin, Needles & Syringes, gabapentin, aspirin EC, cholecalciferol, NOVOFINE PLUS, Insulin Glargine, Insulin Lispro, Insulin Pen Needle, canagliflozin, triamcinolone, ONE TOUCH ULTRA TEST, citalopram, clotrimazole-betamethasone, JANUMET XR, and ferrous sulfate.  Meds ordered this encounter  Medications  . Azilsartan-Chlorthalidone (EDARBYCLOR) 40-12.5 MG TABS    Sig: Take 1 tablet by mouth daily.    Dispense:  35 tablet    Refill:  0  . atorvastatin (LIPITOR) 40 MG tablet    Sig: Take 1 tablet (40 mg total) by mouth daily.    Dispense:  90 tablet    Refill:  1     Follow-up: Return in about 4 weeks (around 06/18/2017).  Scarlette Calico, MD

## 2017-05-21 NOTE — Patient Instructions (Signed)

## 2017-05-25 ENCOUNTER — Encounter: Payer: Self-pay | Admitting: Neurology

## 2017-06-01 ENCOUNTER — Other Ambulatory Visit (INDEPENDENT_AMBULATORY_CARE_PROVIDER_SITE_OTHER): Payer: Medicare Other

## 2017-06-01 DIAGNOSIS — Z794 Long term (current) use of insulin: Secondary | ICD-10-CM | POA: Diagnosis not present

## 2017-06-01 DIAGNOSIS — I10 Essential (primary) hypertension: Secondary | ICD-10-CM | POA: Diagnosis not present

## 2017-06-01 DIAGNOSIS — D539 Nutritional anemia, unspecified: Secondary | ICD-10-CM

## 2017-06-01 DIAGNOSIS — E118 Type 2 diabetes mellitus with unspecified complications: Secondary | ICD-10-CM

## 2017-06-01 LAB — CBC WITH DIFFERENTIAL/PLATELET
Basophils Absolute: 0 10*3/uL (ref 0.0–0.1)
Basophils Relative: 1.2 % (ref 0.0–3.0)
EOS PCT: 3.5 % (ref 0.0–5.0)
Eosinophils Absolute: 0.1 10*3/uL (ref 0.0–0.7)
HEMATOCRIT: 36.3 % — AB (ref 39.0–52.0)
HEMOGLOBIN: 12.1 g/dL — AB (ref 13.0–17.0)
LYMPHS PCT: 35.2 % (ref 12.0–46.0)
Lymphs Abs: 1.4 10*3/uL (ref 0.7–4.0)
MCHC: 33.5 g/dL (ref 30.0–36.0)
MCV: 85.9 fl (ref 78.0–100.0)
MONO ABS: 0.6 10*3/uL (ref 0.1–1.0)
Monocytes Relative: 14.3 % — ABNORMAL HIGH (ref 3.0–12.0)
Neutro Abs: 1.8 10*3/uL (ref 1.4–7.7)
Neutrophils Relative %: 45.8 % (ref 43.0–77.0)
Platelets: 215 10*3/uL (ref 150.0–400.0)
RBC: 4.22 Mil/uL (ref 4.22–5.81)
RDW: 14.6 % (ref 11.5–15.5)
WBC: 3.9 10*3/uL — AB (ref 4.0–10.5)

## 2017-06-01 LAB — BASIC METABOLIC PANEL
BUN: 44 mg/dL — ABNORMAL HIGH (ref 6–23)
CHLORIDE: 103 meq/L (ref 96–112)
CO2: 27 mEq/L (ref 19–32)
Calcium: 10.3 mg/dL (ref 8.4–10.5)
Creatinine, Ser: 2.17 mg/dL — ABNORMAL HIGH (ref 0.40–1.50)
GFR: 38.73 mL/min — AB (ref >60.00)
Glucose, Bld: 175 mg/dL — ABNORMAL HIGH (ref 70–99)
POTASSIUM: 4.5 meq/L (ref 3.5–5.1)
SODIUM: 138 meq/L (ref 135–145)

## 2017-06-02 ENCOUNTER — Encounter: Payer: Self-pay | Admitting: Internal Medicine

## 2017-06-14 ENCOUNTER — Encounter (INDEPENDENT_AMBULATORY_CARE_PROVIDER_SITE_OTHER): Payer: Self-pay | Admitting: Specialist

## 2017-06-14 ENCOUNTER — Ambulatory Visit (INDEPENDENT_AMBULATORY_CARE_PROVIDER_SITE_OTHER): Payer: Medicare Other | Admitting: Specialist

## 2017-06-14 VITALS — BP 129/82 | HR 84 | Ht 71.0 in | Wt 222.0 lb

## 2017-06-14 DIAGNOSIS — M5030 Other cervical disc degeneration, unspecified cervical region: Secondary | ICD-10-CM

## 2017-06-14 DIAGNOSIS — M19012 Primary osteoarthritis, left shoulder: Secondary | ICD-10-CM | POA: Diagnosis not present

## 2017-06-14 MED ORDER — METHYLPREDNISOLONE ACETATE 40 MG/ML IJ SUSP
40.0000 mg | INTRAMUSCULAR | Status: AC | PRN
  Administered 2017-06-14: 40 mg via INTRA_ARTICULAR

## 2017-06-14 MED ORDER — BUPIVACAINE HCL 0.5 % IJ SOLN
3.0000 mL | INTRAMUSCULAR | Status: AC | PRN
  Administered 2017-06-14: 3 mL via INTRA_ARTICULAR

## 2017-06-14 NOTE — Progress Notes (Addendum)
Office Visit Note   Patient: Antonio Adams           Date of Birth: 12-22-1945           MRN: [id number] Visit Date: 06/14/2017              Requested by: [name], MD [address] [place], [place] [address] PCP: [name], MD   Assessment & Plan: Visit Diagnoses:  1. Primary osteoarthritis, left shoulder   2. Other cervical disc degeneration, unspecified cervical region     PlanAvoid overhead lifting and overhead use of the arms. Do not lift greater than 10 lbs. Tylenol ES one every 6-8 hours for pain and inflamation. Avoid frequent bending and stooping  No lifting greater than 10 lbs. May use ice or moist heat for pain. Weight loss is of benefit. Continue with PT for another 2-3 weeks, then a home exercise program. Avoid overhead lifting and overhead use of the arms. Do not lift greater than 5 lbs. Adjust head rest in vehicle to prevent hyperextension if rear ended. Take extra precautions to avoid falling..  Follow-Up Instructions: No Follow-up on file.   Orders:  Orders Placed This Encounter  Procedures  . Large Joint Inj   No orders of the defined types were placed in this encounter.     Procedures: Large Joint Inj: L glenohumeral on 06/14/2017 4:55 PM Indications: pain Details: 25 G 1.5 in needle, anterior approach  Arthrogram: No  Medications: 40 mg methylPREDNISolone acetate 40 MG/ML; 3 mL bupivacaine 0.5 % Outcome: tolerated well, no immediate complications  Bandaid. Procedure, treatment alternatives, risks and benefits explained, specific risks discussed. Consent was given by the patient. Immediately prior to procedure a time out was called to verify the correct patient, procedure, equipment, support staff and site/side marked as required. Patient was prepped and draped in the usual sterile fashion.       Clinical Data: No additional findings.   Subjective: Chief Complaint  Patient presents with  . Lower Back  - Follow-up  . Left Knee - Follow-up  . Left Shoulder - Pain    72 year old male with history of multiple lumbar disc herniations right L4-5 and L5-S1 and right foramenal HNP. He has significant DDD the L3-S1 and in the course of treatment Found to have a right renal tumor, underwent a right nephrectomy. He had left shoulder surgery with arthroscopy, he had a debridement of the left shoulder . He has pain with ROM Of the left shoulder and with overhead use of the arm.    Review of Systems   Objective: Vital Signs: BP 129/82 (BP Location: Left Arm, Patient Position: Sitting)   Pulse 84   Ht 5\' 11"  (1.803 m)   Wt 222 lb (100.7 kg)   BMI 30.96 kg/m   Physical Exam  Ortho Exam  Specialty Comments:  No specialty comments available.  Imaging: No results found.   PMFS History: Patient Active Problem List   Diagnosis Date Noted  . Spinal stenosis of lumbar region with radiculopathy 10/30/2011    Priority: High    Class: Chronic  . Hearing loss 05/21/2017  . Seasonal allergic rhinitis due to pollen 11/01/2016  . Microalbuminuria due to type 2 diabetes mellitus ([place]) 07/27/2016  . Renal cell carcinoma ([place]) 10/06/2015  . Varicose veins of both lower extremities 08/06/2015  . Varicose veins of lower extremities with ulcer ([place]) 06/16/2015  . Other constipation 08/03/2014  . Internal hemorrhoid,  bleeding 08/03/2014  . Post traumatic stress disorder (PTSD) 01/29/2014  . Deficiency anemia 12/10/2013  . Radiation proctitis 06/04/2012  . OSA (obstructive sleep apnea) 11/24/2010  . Memory difficulty 11/24/2010  . Obesity, Class I, BMI 30-34.9 11/24/2010  . Routine general medical examination at a health care facility 11/24/2010  . ADENOCARCINOMA, PROSTATE, GLEASON GRADE 6 12/21/2009  . Type II diabetes mellitus with manifestations ([place]) 03/16/2007  . Hyperlipidemia with target LDL less than 100 03/16/2007  . Essential hypertension 03/16/2007  . GERD 03/16/2007   Past  Medical History:  Diagnosis Date  . ADENOCARCINOMA, PROSTATE, GLEASON GRADE 6 12/21/2009   prostate cancer  . ALLERGIC RHINITIS 03/16/2007  . Allergy   . Anxiety   . Arthritis    back   . BACK PAIN WITH RADICULOPATHY 11/23/2008  . Cancer of kidney ([place])    partial right kidney removed  . CARPAL TUNNEL SYNDROME, BILATERAL 03/16/2007  . Chronic back pain   . Constipation    takes Senokot daily  . Depression    occasionally  . DIABETES MELLITUS, TYPE II 03/16/2007   takes Januvia and MEtformin daily  . GASTROINTESTINAL HEMORRHAGE, HX OF 03/16/2007  . GERD 03/16/2007   takes Omeprazole daily  . Glaucoma    mild - no eye drops  . Hemorrhoids   . History of colon polyps   . HYPERLIPIDEMIA 03/16/2007   takes Crestor daily  . HYPERTENSION 03/16/2007   takes Diltiazem and Lisinopril daily   . LEG CRAMPS 05/22/2007  . Nocturia   . Overactive bladder   . PARESTHESIA 05/21/2007  . Proctitis   . Rectal bleeding    Dr.Norins has explained its from the Radiation that he has received  . Rectal bleeding   . Sleep apnea    uses CPAP nightly  . Stomach cancer ([place])   . Urinary frequency    takes Toviaz daily    Family History  Problem Relation Age of Onset  . Cancer Sister 30       Cancer, unsure type  . Hypertension Other   . Colon cancer Neg Hx   . Esophageal cancer Neg Hx   . Stomach cancer Neg Hx   . Diabetes Neg Hx   . Colon polyps Neg Hx   . Rectal cancer Neg Hx     Past Surgical History:  Procedure Laterality Date  . [place], 2008   repeat surgery; ESI '08  . CARPAL TUNNEL RELEASE     bilateral  . CERVICAL FUSION    . COLONOSCOPY    . colonosocpy    . ESOPHAGOGASTRODUODENOSCOPY     ED  . FLEXIBLE SIGMOIDOSCOPY N/A 01/08/2017   Procedure: FLEXIBLE SIGMOIDOSCOPY;  Surgeon: Irene Shipper, MD;  [facility] ENDOSCOPY;  Service: Endoscopy;  Laterality: N/A;  . HOT HEMOSTASIS N/A 01/08/2017   Procedure: HOT HEMOSTASIS (ARGON PLASMA COAGULATION/BICAP);  Surgeon: Irene Shipper, MD;  Location[facility] ENDOSCOPY;  Service: Endoscopy;  Laterality: N/A;  . LUMBAR LAMINECTOMY  10/30/2011   Procedure: MICRODISCECTOMY LUMBAR LAMINECTOMY;  Surgeon: Jessy Oto, MD;  Location: [place];  Service: Orthopedics;  Laterality: Right;  Right L4-5 and L5-S1 Microdiscectomy  . LUMBAR LAMINECTOMY  06/17/2012   Procedure: MICRODISCECTOMY LUMBAR LAMINECTOMY;  Surgeon: Jessy Oto, MD;  Location: [place];  Service: Orthopedics;  Laterality: N/A;  Right L5-S1 microdiscectomy  . POLYPECTOMY    . PROSTATE SURGERY  march 2012   seed implant  . ROBOTIC ASSITED PARTIAL NEPHRECTOMY Right 09/08/2015  Procedure: XI ROBOTIC ASSITED RIGHT PARTIAL NEPHRECTOMY;  Surgeon: [name], MD;  Location: [facility];  Service: Urology;  Laterality: Right;  Clamp on: 1033 Clamp off: 1103 Total Clamp Time: 30 minutes  . SHOULDER ARTHROSCOPY WITH SUBACROMIAL DECOMPRESSION Left 01/20/2016   Procedure: LEFT SHOULDER ARTHROSCOPY WITH EXTENSIVE  DEBRIDEMENT, ACROMIOPLASTY;  Surgeon: Ninetta Lights, MD;  Location: [place];  Service: Orthopedics;  Laterality: Left;  . SHOULDER SURGERY  LFT  . Stress Cardiolite  12/24/2003   negative for ischemia  . UPPER ESOPHAGEAL ENDOSCOPIC ULTRASOUND (EUS)  04/18/2016   [facility]  . UPPER GASTROINTESTINAL ENDOSCOPY    . WOUND EXPLORATION     management of a wound that he sustained in the [facility] History  . 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 Smoking status: Never Smoker  . Smokeless tobacco: Never Used  Substance and Sexual Activity  . Alcohol use: Yes    Alcohol/week: 0.0 oz    Comment: occasional  . Drug use: No  . Sexual activity: Not on file

## 2017-06-14 NOTE — Patient Instructions (Addendum)
Avoid overhead lifting and overhead use of the arms. Do not lift greater than 10 lbs. Tylenol ES one every 6-8 hours for pain and inflamation. Avoid frequent bending and stooping  No lifting greater than 10 lbs. May use ice or moist heat for pain. Weight loss is of benefit. Continue with PT for another 2-3 weeks, then a home exercise program. Avoid overhead lifting and overhead use of the arms. Do not lift greater than 5 lbs. Adjust head rest in vehicle to prevent hyperextension if rear ended. Take extra precautions to avoid falling.Marland Kitchen

## 2017-06-26 ENCOUNTER — Ambulatory Visit (INDEPENDENT_AMBULATORY_CARE_PROVIDER_SITE_OTHER): Payer: Medicare Other

## 2017-06-26 ENCOUNTER — Ambulatory Visit (INDEPENDENT_AMBULATORY_CARE_PROVIDER_SITE_OTHER): Payer: Medicare Other | Admitting: Specialist

## 2017-06-26 ENCOUNTER — Encounter (INDEPENDENT_AMBULATORY_CARE_PROVIDER_SITE_OTHER): Payer: Self-pay | Admitting: Specialist

## 2017-06-26 VITALS — BP 121/78 | HR 81 | Ht 71.0 in | Wt 222.0 lb

## 2017-06-26 DIAGNOSIS — M545 Low back pain: Secondary | ICD-10-CM

## 2017-06-26 DIAGNOSIS — M5441 Lumbago with sciatica, right side: Secondary | ICD-10-CM | POA: Diagnosis not present

## 2017-06-26 DIAGNOSIS — R29898 Other symptoms and signs involving the musculoskeletal system: Secondary | ICD-10-CM | POA: Diagnosis not present

## 2017-06-26 NOTE — Patient Instructions (Signed)
  Plan: Avoid frequent bending and stooping  No lifting greater than 10 lbs. May use ice or moist heat for pain. Weight loss is of benefit. MRI of the lumbar spine ordered to assess the lumbar spine for signs of right sided Nerve compression causing you right foot weakness.
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
## 2017-07-09 ENCOUNTER — Ambulatory Visit (INDEPENDENT_AMBULATORY_CARE_PROVIDER_SITE_OTHER): Payer: Medicare Other | Admitting: Specialist

## 2017-07-14 ENCOUNTER — Ambulatory Visit
Admission: RE | Admit: 2017-07-14 | Discharge: 2017-07-14 | Disposition: A | Discharge status: Home / Self Care | Payer: Medicare Other | Source: Ambulatory Visit | Attending: Specialist | Admitting: Specialist

## 2017-07-14 DIAGNOSIS — M5441 Lumbago with sciatica, right side: Secondary | ICD-10-CM

## 2017-07-14 DIAGNOSIS — M48061 Spinal stenosis, lumbar region without neurogenic claudication: Secondary | ICD-10-CM | POA: Diagnosis not present

## 2017-07-14 DIAGNOSIS — R29898 Other symptoms and signs involving the musculoskeletal system: Secondary | ICD-10-CM

## 2017-07-14 DIAGNOSIS — M545 Low back pain: Secondary | ICD-10-CM

## 2017-07-16 ENCOUNTER — Ambulatory Visit: Payer: Medicare Other | Admitting: Podiatry

## 2017-07-16 ENCOUNTER — Ambulatory Visit (INDEPENDENT_AMBULATORY_CARE_PROVIDER_SITE_OTHER): Payer: Medicare Other

## 2017-07-16 DIAGNOSIS — M779 Enthesopathy, unspecified: Secondary | ICD-10-CM | POA: Diagnosis not present

## 2017-07-16 DIAGNOSIS — M19071 Primary osteoarthritis, right ankle and foot: Secondary | ICD-10-CM

## 2017-07-16 DIAGNOSIS — M792 Neuralgia and neuritis, unspecified: Secondary | ICD-10-CM

## 2017-07-17 ENCOUNTER — Telehealth: Payer: Self-pay | Admitting: *Deleted

## 2017-07-17 DIAGNOSIS — R200 Anesthesia of skin: Secondary | ICD-10-CM

## 2017-07-17 NOTE — Telephone Encounter (Signed)
-----   Message from Trula Slade, DPM sent at 07/16/2017  4:38 PM EST ----- Can you please order a nerve conduction test on the right leg? He has numbness in his right foot, especially when sitting down and has difficulty moving the foot and had to actually call EMS.

## 2017-07-17 NOTE — Telephone Encounter (Signed)
Faxed orders to [facility].

## 2017-07-17 NOTE — Progress Notes (Signed)
Subjective:   Patient ID: Antonio Adams, male   DOB: 72 y.o.   MRN: [id number]   HPI 72 year old male presents the office today for concerns of right foot numbness.  He states that this is been ongoing for about 6 months.  He states that he actually had to call EMS in February because of the right leg started getting numb and he could no longer drive.  He has had a history of 4 back surgeries.  He denies any pain to the feet except for the numbness which is intermittent.  He does notice this mostly when he is sitting down driving.  He denies any recent injury or trauma he denies any falls or weakness to his legs.  He does feel that he is on balance at times and this happens.  He has no other concerns today.  He is diabetic his last A1c was 7.4.  No other concerns today.  Review of Systems  All other systems reviewed and are negative.       Objective:  Physical Exam  General: AAO x3, NAD  Dermatological: Skin is warm, dry and supple bilateral. Nails x 10 are well manicured; remaining integument appears unremarkable at this time. There are no open sores, no preulcerative lesions, no rash or signs of infection present.  Vascular: Dorsalis Pedis artery and Posterior Tibial artery pedal pulses are 2/4 bilateral with immedate capillary fill time. There is no pain with calf compression, swelling, warmth, erythema.  Denies any claudication symptoms.  Neruologic: Sensation minimally decreased with Derrel Nip monofilament to some of the digits.  There is decreased vibratory sensation to the first metatarsophalangeal joint bilaterally.  Musculoskeletal: There is no area of tenderness identified to bilateral lower extremities and there is no pinpoint tenderness.  There is no overlying edema, erythema, increase in warmth.  No pain with joint range of motion.  Muscular strength 5/5 in all groups tested bilateral.  Gait: Unassisted, Nonantalgic.       Assessment:   Neuritis symptoms right  leg, foot     Plan:  -Treatment options discussed including all alternatives, risks, and complications -Etiology of symptoms were discussed -X-rays were obtained and reviewed with the patient.  There is no definitive evidence of acute fracture or stress fracture or any bony abnormality to account for his pain.  Mild arthritic changes are present. -Given his symptoms I recommended a nerve conduction test at this time.  He has had this several years ago but he has not had it since the symptoms started.  He states that he has difficulty doing this and he may need to relax him.  Discussed that and call us before the procedure and we can try to prescribe Valium for him if needed.  I do think that his symptoms are more nerve related and is localized to the right lower extremity I think a nerve conduction test would be valuable for him. -Follow-up ulnar conduction testing sooner if any issues are to arise.  There is any worsening or if he notices any weakness or falls he is to call the office before that.  He agrees this plan has no further questions or concerns today was thankful.  Trula Slade DPM

## 2017-07-19 DIAGNOSIS — C642 Malignant neoplasm of left kidney, except renal pelvis: Secondary | ICD-10-CM | POA: Diagnosis not present

## 2017-07-19 DIAGNOSIS — C641 Malignant neoplasm of right kidney, except renal pelvis: Secondary | ICD-10-CM | POA: Diagnosis not present

## 2017-07-19 NOTE — Progress Notes (Signed)
Follow up appt 08/15/17

## 2017-08-01 ENCOUNTER — Ambulatory Visit: Payer: Medicare Other | Admitting: Neurology

## 2017-08-02 ENCOUNTER — Ambulatory Visit (INDEPENDENT_AMBULATORY_CARE_PROVIDER_SITE_OTHER): Payer: Medicare Other | Admitting: *Deleted

## 2017-08-02 ENCOUNTER — Telehealth: Payer: Self-pay | Admitting: *Deleted

## 2017-08-02 VITALS — BP 164/88 | HR 78 | Resp 18 | Ht 71.0 in | Wt 224.0 lb

## 2017-08-02 DIAGNOSIS — Z0000 Encounter for general adult medical examination without abnormal findings: Secondary | ICD-10-CM

## 2017-08-02 NOTE — Progress Notes (Addendum)
Subjective:   Antonio Adams is a 72 y.o. male who presents for Medicare Annual/Subsequent preventive examination.  Review of Systems:  No ROS.  Medicare Wellness Visit. Additional risk factors are reflected in the social history.  Cardiac Risk Factors include: advanced age (>44men, >59 women);diabetes mellitus;dyslipidemia;hypertension;male gender Sleep patterns: has restless sleep, awakens early, gets up 1-2 times nightly to void and sleeps 5 hours nightly. Patient reports insomnia issues, discussed recommended sleep tips and stress reduction tips, education was provided via handout.  Home Safety/Smoke Alarms: Feels safe in home. Smoke alarms in place.  Living environment; residence and Firearm Safety: 1-story house/ trailer, no firearms.Lives with wife no needs for DME, good support system  Seat Belt Safety/Bike Helmet: Wears seat belt.   PSA-  Lab Results  Component Value Date   PSA 0.046 08/25/2016   PSA 0.09 (L) 12/10/2013   PSA 0.06 (L) 11/11/2010       Objective:    Vitals: BP (!) 164/88   Pulse 78   Resp 18   Ht 5\' 11"  (1.803 m)   Wt 224 lb (101.6 kg)   SpO2 99%   BMI 31.24 kg/m   Body mass index is 31.24 kg/m.  Advanced Directives 08/02/2017 01/08/2017 07/30/2016 03/03/2016 02/08/2016 01/20/2016 01/18/2016  Does Patient Have a Medical Advance Directive? No No Yes No No No No  Type of Advance Directive - Public librarian;Living will - - - -  Copy of [place] in Chart? - - No - copy requested - - - -  Would patient like information on creating a medical advance directive? Yes (ED - Information included in AVS) Yes (MAU/Ambulatory/Procedural Areas - Information given) - - - No - patient declined information -  Pre-existing out of facility DNR order (yellow form or pink MOST form) - - - - - - -    Tobacco Social History   Tobacco Use  Smoking Status Never Smoker  Smokeless Tobacco Never Used     Counseling given: Not  Answered  Past Medical History:  Diagnosis Date  . ADENOCARCINOMA, PROSTATE, GLEASON GRADE 6 12/21/2009   prostate cancer  . ALLERGIC RHINITIS 03/16/2007  . Allergy   . Anxiety   . Arthritis    back   . BACK PAIN WITH RADICULOPATHY 11/23/2008  . Cancer of kidney ([place])    partial right kidney removed  . CARPAL TUNNEL SYNDROME, BILATERAL 03/16/2007  . Chronic back pain   . Constipation    takes Senokot daily  . Depression    occasionally  . DIABETES MELLITUS, TYPE II 03/16/2007   takes Januvia and MEtformin daily  . GASTROINTESTINAL HEMORRHAGE, HX OF 03/16/2007  . GERD 03/16/2007   takes Omeprazole daily  . Glaucoma    mild - no eye drops  . Hemorrhoids   . History of colon polyps   . HYPERLIPIDEMIA 03/16/2007   takes Crestor daily  . HYPERTENSION 03/16/2007   takes Diltiazem and Lisinopril daily   . LEG CRAMPS 05/22/2007  . Nocturia   . Overactive bladder   . PARESTHESIA 05/21/2007  . Proctitis   . Rectal bleeding    Dr.Norins has explained its from the Radiation that he has received  . Rectal bleeding   . Sleep apnea    uses CPAP nightly  . Stomach cancer ([place])   . Urinary frequency    takes Toviaz daily   Past Surgical History:  Procedure Laterality Date  . Early, 2008  repeat surgery; ESI '08  . CARPAL TUNNEL RELEASE     bilateral  . CERVICAL FUSION    . COLONOSCOPY    . colonosocpy    . ESOPHAGOGASTRODUODENOSCOPY     ED  . FLEXIBLE SIGMOIDOSCOPY N/A 01/08/2017   Procedure: FLEXIBLE SIGMOIDOSCOPY;  Surgeon: Irene Shipper, MD;  [facility] END[facility]COPY;  Service: Endoscopy;  Laterality: N/A;  . HOT HEMOSTASIS N/A 01/08/2017   Procedure: HOT HEMOSTASIS (ARGON PLASMA COAGULATION/BICAP);  Surgeon: Irene Shipper, MD;  Location[facility]COPY;  Service: Endoscopy;  Laterality: N/A;  . LUMBAR LAMINECTOMY  10/30/2011   Procedure: MICRODISCECTOMY LUMBAR LAMINECTOMY;  Surgeon: Jessy Oto, MD;  Location: [place];  Service: Orthopedics;  Laterality: Right;  Right L4-5  and L5-S1 Microdiscectomy  . LUMBAR LAMINECTOMY  06/17/2012   Procedure: MICRODISCECTOMY LUMBAR LAMINECTOMY;  Surgeon: Jessy Oto, MD;  Location: [place];  Service: Orthopedics;  Laterality: N/A;  Right L5-S1 microdiscectomy  . POLYPECTOMY    . PROSTATE SURGERY  march 2012   seed implant  . ROBOTIC ASSITED PARTIAL NEPHRECTOMY Right 09/08/2015   Procedure: XI ROBOTIC ASSITED RIGHT PARTIAL NEPHRECTOMY;  Surgeon: [name], MD;  Location: [facility];  Service: Urology;  Laterality: Right;  Clamp on: 1033 Clamp off: 1103 Total Clamp Time: 30 minutes  . SHOULDER ARTHROSCOPY WITH SUBACROMIAL DECOMPRESSION Left 01/20/2016   Procedure: LEFT SHOULDER ARTHROSCOPY WITH EXTENSIVE  DEBRIDEMENT, ACROMIOPLASTY;  Surgeon: Ninetta Lights, MD;  Location: [place];  Service: Orthopedics;  Laterality: Left;  . SHOULDER SURGERY  LFT  . Stress Cardiolite  12/24/2003   negative for ischemia  . UPPER ESOPHAGEAL ENDOSCOPIC ULTRASOUND (EUS)  04/18/2016   [facility] hospital  . UPPER GASTROINTESTINAL ENDOSCOPY    . WOUND EXPLORATION     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   Family History  Problem Relation Age of Onset  . Cancer Sister 30       Cancer, unsure type  . Hypertension Other   . Colon cancer Neg Hx   . Esophageal cancer Neg Hx   . Stomach cancer Neg Hx   . Diabetes Neg Hx   . Colon polyps Neg Hx   . Rectal cancer Neg Hx    Social History   Socioeconomic History  . Marital status: Married    Spouse name: Deneise Lever  . Number of children: 3  . Years of education: 12+  . Highest education level: Not on file  Occupational History  . Occupation: Event organiser; Banker: RETIRED    Comment: retired from Event organiser  Social Needs  . Financial resource strain: Not hard at all  . Food insecurity:    Worry: Never true    Inability: Never true  . Transportation needs:    Medical: No    Non-medical: No  Tobacco Use  . Smoking status: Never Smoker   . Smokeless tobacco: Never Used  Substance and Sexual Activity  . Alcohol use: Yes    Alcohol/week: 0.0 oz    Comment: occasional  . Drug use: No  . Sexual activity: Not Currently    Partners: Female  Lifestyle  . Physical activity:    Days per week: 0 days    Minutes per session: 0 min  . Stress: To some extent  Relationships  . Social connections:    Talks on phone: More than three times a week    Gets together: More than three times a week  Attends religious service: More than 4 times per year    Active member of club or organization: Yes    Attends meetings of clubs or organizations: More than 4 times per year    Relationship status: Married  Other Topics Concern  . Not on file  Social History Narrative   Married-divorced '89, remarried '96. 2 sons, 1 daughter. 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 He wishes to be a full resuscitation candidate.        Outpatient Encounter Medications as of 08/02/2017  Medication Sig  . aspirin EC 81 MG tablet Take 81 mg by mouth daily.  Marland Kitchen atorvastatin (LIPITOR) 40 MG tablet Take 1 tablet (40 mg total) by mouth daily.  . Azilsartan-Chlorthalidone (EDARBYCLOR) 40-12.5 MG TABS Take 1 tablet by mouth daily.  . canagliflozin (INVOKANA) 100 MG TABS tablet Take 1 tablet (100 mg total) by mouth daily before breakfast.  . cholecalciferol (VITAMIN D) 1000 units tablet Take 1,000 Units by mouth daily.  . citalopram (CELEXA) 40 MG tablet Take 1 tablet by mouth daily.  . clotrimazole-betamethasone (LOTRISONE) cream APPLY 1 APPLICATION TOPICALLY 2 (TWO) TIMES DAILY.  Marland Kitchen diltiazem (DILACOR XR) 120 MG 24 hr capsule Take 120 mg by mouth daily.    . ferrous sulfate 325 (65 FE) MG tablet TAKE 1 TABLET (325 MG TOTAL) BY MOUTH 2 (TWO) TIMES DAILY.  Marland Kitchen gabapentin (NEURONTIN) 300 MG capsule Take 300 mg by mouth at bedtime.  . Insulin Glargine (LANTUS SOLOSTAR) 100 UNIT/ML Solostar Pen Inject 70 Units  into the skin every morning.  . Insulin Lispro (HUMALOG KWIKPEN) 200 UNIT/ML SOPN Inject 5 Units into the skin 3 (three) times daily after meals.  . Insulin Pen Needle (NOVOFINE) 32G X 6 MM MISC 1 Act by Does not apply route daily. USE TID  . JANUMET XR 50-1000 MG TB24 TAKE 1 TABLET BY MOUTH DAILY.  Marland Kitchen Needles & Syringes MISC Use syringe and needle to inject insulin into the skin as directed on insulin prescription. DX E11.09  . NOVOFINE PLUS 32G X 4 MM MISC USE DAILY WITH INSULIN  . omeprazole (PRILOSEC) 40 MG capsule TAKE ONE CAPSULE EVERY DAY  . ONE TOUCH ULTRA TEST test strip USE 2 (TWO) TIMES DAILY. AND LANCETS 2/DAY 250.03  . sertraline (ZOLOFT) 100 MG tablet Take 1 tablet by mouth daily. Reported on 08/06/2015  . tamsulosin (FLOMAX) 0.4 MG CAPS capsule TAKE 1 CAPSULE (0.4 MG TOTAL) BY MOUTH DAILY.  Marland Kitchen triamcinolone (NASACORT) 55 MCG/ACT AERO nasal inhaler Place 2 sprays into the nose daily.  Marland Kitchen zolpidem (AMBIEN) 10 MG tablet Take 10 mg by mouth at bedtime as needed for sleep.    No facility-administered encounter medications on file as of 08/02/2017.     Activities of Daily Living In your present state of health, do you have any difficulty performing the following activities: 08/02/2017  Hearing? N  Vision? N  Difficulty concentrating or making decisions? N  Walking or climbing stairs? N  Dressing or bathing? N  Doing errands, shopping? N  Preparing Food and eating ? N  Using the Toilet? N  In the past six months, have you accidently leaked urine? N  Do you have problems with loss of bowel control? N  Managing your Medications? N  Managing your Finances? N  Housekeeping or managing your Housekeeping? N  Some recent data might be hidden    Patient Care Team: [name], MD as PCP -  General (Internal Medicine) Jessy Oto, MD as Consulting Physician (Orthopedic Surgery) Kathie Rhodes, MD as Consulting Physician (Urology) [facility], [place] as  Referring Physician [facility]) Irene Shipper, MD as Consulting Physician (Gastroenterology)   Assessment:   This is a routine wellness examination for [place]. Physical assessment deferred to PCP.    Exercise Activities and Dietary recommendations Current Exercise Habits: The patient does not participate in regular exercise at present, Exercise limited by: None identified  Diet (meal preparation, eat out, water intake, caffeinated beverages, dairy products, fruits and vegetables): in general, a "healthy" diet  , well balanced   Reviewed heart healthy and diabetic diet, encouraged patient to increase daily water intake.  Goals    . Patient Stated     Increase physical activity by exercising up to 3 times weekly. Plan to add more equipment to my home gym and do the workouts.    . Weight < 220 lb (99.791 kg)     Will pick back up on weights when health is restored        Fall Risk Fall Risk  08/02/2017 07/30/2016 07/07/2015 11/24/2014 01/29/2014  Falls in the past year? No No No No No    Depression Screen PHQ 2/9 Scores 08/02/2017 07/30/2016 07/07/2015 11/24/2014  PHQ - 2 Score 1 0 1 0  PHQ- 9 Score 6 - - -    Cognitive Function MMSE - Mini Mental State Exam 08/02/2017 07/07/2015  Orientation to time 5 5  Orientation to Place 5 5  Registration 3 3  Attention/ Calculation 4 1  Recall 2 2  Language- name 2 objects 2 2  Language- repeat 1 1  Language- follow 3 step command 3 3  Language- read & follow direction 1 1  Write a sentence 1 1  Copy design 1 1  Total score 28 25        Immunization History  Administered Date(s) Administered  . Influenza Whole 02/13/2012  . Influenza, High Dose Seasonal PF 02/09/2017  . Influenza-Unspecified 01/13/2014, 02/13/2015, 03/15/2016  . Pneumococcal Conjugate-13 12/10/2013  . Pneumococcal Polysaccharide-23 01/14/2010, 07/07/2015  . Td 01/29/2008  . Zoster 01/14/2010   Screening Tests Health Maintenance  Topic Date Due  .  OPHTHALMOLOGY EXAM  08/30/2017  . HEMOGLOBIN A1C  11/18/2017  . TETANUS/TDAP  01/28/2018  . FOOT EXAM  05/21/2018  . COLONOSCOPY  03/14/2021  . INFLUENZA VACCINE  Completed  . Hepatitis C Screening  Completed  . PNA vac Low Risk Adult  Completed      Plan:     Nurse will inform PCP of elevated B/P readings of 172/88 and 164/88.   Continue doing brain stimulating activities (puzzles, reading, adult coloring books, staying active) to keep memory sharp.   Continue to eat heart healthy diet (full of fruits, vegetables, whole grains, lean protein, water--limit salt, fat, and sugar intake) and increase physical activity as tolerated.  I have personally reviewed and noted the following in the patient's chart:   . Medical and social history . Use of alcohol, tobacco or illicit drugs  . Current medications and supplements . Functional ability and status . Nutritional status . Physical activity . Advanced directives . List of other physicians . Vitals . Screenings to include cognitive, depression, and falls . Referrals and appointments  In addition, I have reviewed and discussed with patient certain preventive protocols, quality metrics, and best practice recommendations. A written personalized care plan for preventive services as well as general preventive health  recommendations were provided to patient.   Medical screening examination/treatment/procedure(s) were performed by non-physician practitioner and as supervising physician I was immediately available for consultation/collaboration. I agree with above. Scarlette Calico, MD   Michiel Cowboy, RN  08/02/2017

## 2017-08-02 NOTE — Telephone Encounter (Signed)
During AWV, patient's B/P was elevated  readings of 172/88, pulse 78 and 164/88, pulse 78. Patient was advised to monitor B/P at home and the nurse would notify PCP.

## 2017-08-02 NOTE — Patient Instructions (Addendum)
Continue doing brain stimulating activities (puzzles, reading, adult coloring books, staying active) to keep memory sharp.   Continue to eat heart healthy diet (full of fruits, vegetables, whole grains, lean protein, water--limit salt, fat, and sugar intake) and increase physical activity as tolerated.   Antonio Adams , Thank you for taking time to come for your Medicare Wellness Visit. I appreciate your ongoing commitment to your health goals. Please review the following plan we discussed and let me know if I can assist you in the future.   These are the goals we discussed: Goals    . Patient Stated     Increase physical activity by exercising up to 3 times weekly. Plan to add to more equipment to my home gym and do the workouts.    . Weight < 220 lb (99.791 kg)     Will pick back up on weights when health is restored        This is a list of the screening recommended for you and due dates:  Health Maintenance  Topic Date Due  . Eye exam for diabetics  08/30/2017  . Hemoglobin A1C  11/18/2017  . Tetanus Vaccine  01/28/2018  . Complete foot exam   05/21/2018  . Colon Cancer Screening  03/14/2021  . Flu Shot  Completed  .  Hepatitis C: One time screening is recommended by Center for Disease Control  (CDC) for  adults born from [birth year] through 1965.   Completed  . Pneumonia vaccines  Completed

## 2017-08-04 ENCOUNTER — Other Ambulatory Visit: Payer: Self-pay | Admitting: Internal Medicine

## 2017-08-06 ENCOUNTER — Ambulatory Visit: Payer: Medicare Other | Admitting: Neurology

## 2017-08-15 ENCOUNTER — Ambulatory Visit (INDEPENDENT_AMBULATORY_CARE_PROVIDER_SITE_OTHER): Payer: Medicare Other | Admitting: Specialist

## 2017-08-15 ENCOUNTER — Encounter (INDEPENDENT_AMBULATORY_CARE_PROVIDER_SITE_OTHER): Payer: Self-pay | Admitting: Specialist

## 2017-08-15 VITALS — BP 151/90 | HR 64 | Ht 71.0 in | Wt 224.0 lb

## 2017-08-15 DIAGNOSIS — M48062 Spinal stenosis, lumbar region with neurogenic claudication: Secondary | ICD-10-CM

## 2017-08-15 DIAGNOSIS — M47816 Spondylosis without myelopathy or radiculopathy, lumbar region: Secondary | ICD-10-CM

## 2017-08-15 DIAGNOSIS — M65312 Trigger thumb, left thumb: Secondary | ICD-10-CM | POA: Diagnosis not present

## 2017-08-15 MED ORDER — DICLOFENAC SODIUM 1 % TD GEL: 2.0000 g | Freq: Four times a day (QID) | TRANSDERMAL | 0 refills | Status: DC

## 2017-08-15 NOTE — Progress Notes (Signed)
Office Visit Note   Patient: Antonio Adams           Date of Birth: 29-Jun-1945           MRN: [id number] Visit Date: 08/15/2017              Requested by: [name], MD [address] [place], [place] [address] PCP: [name], MD   Assessment & Plan: Visit Diagnoses:  1. Trigger thumb, left thumb   2. Spondylosis without myelopathy or radiculopathy, lumbar region   3. Spinal stenosis of lumbar region with neurogenic claudication     Plan:Avoid frequent bending and stooping  No lifting greater than 10 lbs. May use ice or moist heat for pain. Weight loss is of benefit.  Stretching exercises of the left thumb in extension, use transdermal arthritis meds 3-4 times a day.    Follow-Up Instructions: Return in about 1 month (around 09/12/2017).   Orders:  No orders of the defined types were placed in this encounter.  No orders of the defined types were placed in this encounter.     Procedures: No procedures performed   Clinical Data: No additional findings.   Subjective: Chief Complaint  Patient presents with  . Lower Back - Pain    72 year old right hand dominant male with diabetes mellitus with oral meds and occasional insulin when BS are greater than 200.  His back pain is right greater than left with right sided lateral thigh and lateral calf pain. Underwent recent MRI, this shows right side  Less foramenal narrowing than on the left but scar right L5-S1 post laminectomies in the past. Today he is having catching in the left thumb  With flexion and extension of the left thumb with a lump that is painful over the volar thumb distal MC.    Review of Systems  Constitutional: Negative.   HENT: Negative.   Eyes: Negative.   Respiratory: Negative.   Cardiovascular: Negative.   Gastrointestinal: Negative.   Endocrine: Negative.   Genitourinary: Negative.   Musculoskeletal: Negative.   Skin: Negative.   Allergic/Immunologic:  Negative.   Neurological: Negative.   Hematological: Negative.   Psychiatric/Behavioral: Negative.      Objective: Vital Signs: BP (!) 151/90   Pulse 64   Ht 5\' 11"  (1.803 m)   Wt 224 lb (101.6 kg)   BMI 31.24 kg/m   Physical Exam  Constitutional: He is oriented to person, place, and time. He appears well-developed and well-nourished.  HENT:  Head: Normocephalic and atraumatic.  Eyes: Pupils are equal, round, and reactive to light. EOM are normal.  Neck: Normal range of motion. Neck supple.  Pulmonary/Chest: Effort normal and breath sounds normal.  Abdominal: Soft. Bowel sounds are normal.  Musculoskeletal: Normal range of motion.  Neurological: He is alert and oriented to person, place, and time.  Skin: Skin is warm and dry.  Psychiatric: He has a normal mood and affect. His behavior is normal. Judgment and thought content normal.    Right Hand Exam   Tenderness  The patient is experiencing no tenderness.   Range of Motion  Wrist  Extension: normal  Flexion: normal  Pronation: normal  Supination: normal    Left Hand Exam   Tenderness  The patient is experiencing tenderness in the palmer area.   Range of Motion  Wrist  Extension: normal  Flexion: normal  Pronation: normal  Supination: normal  Hand  MP Thumb: 50  DIP  Thumb: 80   Muscle Strength  Wrist extension: 5/5  Wrist flexion: 5/5  Grip:  5/5   Tests  Phalen's Sign: negative Tinel's sign (median nerve): negative Finkelstein's test: negative  Other  Erythema: absent Scars: absent Sensation: normal Pulse: present      Specialty Comments:  No specialty comments available.  Imaging: No results found.   PMFS History: Patient Active Problem List   Diagnosis Date Noted  . Spinal stenosis of lumbar region with radiculopathy 10/30/2011    Priority: High    Class: Chronic  . Hearing loss 05/21/2017  . Seasonal allergic rhinitis due to pollen 11/01/2016  . Microalbuminuria due to  type 2 diabetes mellitus ([place]) 07/27/2016  . Renal cell carcinoma ([place]) 10/06/2015  . Varicose veins of both lower extremities 08/06/2015  . Varicose veins of lower extremities with ulcer ([place]) 06/16/2015  . Other constipation 08/03/2014  . Internal hemorrhoid, bleeding 08/03/2014  . Post traumatic stress disorder (PTSD) 01/29/2014  . Deficiency anemia 12/10/2013  . Radiation proctitis 06/04/2012  . OSA (obstructive sleep apnea) 11/24/2010  . Memory difficulty 11/24/2010  . Obesity, Class I, BMI 30-34.9 11/24/2010  . Routine general medical examination at a health care facility 11/24/2010  . ADENOCARCINOMA, PROSTATE, GLEASON GRADE 6 12/21/2009  . Type II diabetes mellitus with manifestations ([place]) 03/16/2007  . Hyperlipidemia with target LDL less than 100 03/16/2007  . Essential hypertension 03/16/2007  . GERD 03/16/2007   Past Medical History:  Diagnosis Date  . ADENOCARCINOMA, PROSTATE, GLEASON GRADE 6 12/21/2009   prostate cancer  . ALLERGIC RHINITIS 03/16/2007  . Allergy   . Anxiety   . Arthritis    back   . BACK PAIN WITH RADICULOPATHY 11/23/2008  . Cancer of kidney ([place])    partial right kidney removed  . CARPAL TUNNEL SYNDROME, BILATERAL 03/16/2007  . Chronic back pain   . Constipation    takes Senokot daily  . Depression    occasionally  . DIABETES MELLITUS, TYPE II 03/16/2007   takes Januvia and MEtformin daily  . GASTROINTESTINAL HEMORRHAGE, HX OF 03/16/2007  . GERD 03/16/2007   takes Omeprazole daily  . Glaucoma    mild - no eye drops  . Hemorrhoids   . History of colon polyps   . HYPERLIPIDEMIA 03/16/2007   takes Crestor daily  . HYPERTENSION 03/16/2007   takes Diltiazem and Lisinopril daily   . LEG CRAMPS 05/22/2007  . Nocturia   . Overactive bladder   . PARESTHESIA 05/21/2007  . Proctitis   . Rectal bleeding    Dr.Norins has explained its from the Radiation that he has received  . Rectal bleeding   . Sleep apnea    uses CPAP nightly  . Stomach cancer ([place])    . Urinary frequency    takes Toviaz daily    Family History  Problem Relation Age of Onset  . Cancer Sister 30       Cancer, unsure type  . Hypertension Other   . Colon cancer Neg Hx   . Esophageal cancer Neg Hx   . Stomach cancer Neg Hx   . Diabetes Neg Hx   . Colon polyps Neg Hx   . Rectal cancer Neg Hx     Past Surgical History:  Procedure Laterality Date  . [place], 2008   repeat surgery; ESI '08  . CARPAL TUNNEL RELEASE     bilateral  . CERVICAL FUSION    . COLONOSCOPY    . colonosocpy    .  ESOPHAGOGASTRODUODENOSCOPY     ED  . FLEXIBLE SIGMOIDOSCOPY N/A 01/08/2017   Procedure: FLEXIBLE SIGMOIDOSCOPY;  Surgeon: Irene Shipper, MD;  [facility] END[facility]COPY;  Service: Endoscopy;  Laterality: N/A;  . HOT HEMOSTASIS N/A 01/08/2017   Procedure: HOT HEMOSTASIS (ARGON PLASMA COAGULATION/BICAP);  Surgeon: Irene Shipper, MD;  Location: [facility] ENDOSCOPY;  Service: Endoscopy;  Laterality: N/A;  . LUMBAR LAMINECTOMY  10/30/2011   Procedure: MICRODISCECTOMY LUMBAR LAMINECTOMY;  Surgeon: Jessy Oto, MD;  Location: [place];  Service: Orthopedics;  Laterality: Right;  Right L4-5 and L5-S1 Microdiscectomy  . LUMBAR LAMINECTOMY  06/17/2012   Procedure: MICRODISCECTOMY LUMBAR LAMINECTOMY;  Surgeon: Jessy Oto, MD;  Location: [place];  Service: Orthopedics;  Laterality: N/A;  Right L5-S1 microdiscectomy  . POLYPECTOMY    . PROSTATE SURGERY  march 2012   seed implant  . ROBOTIC ASSITED PARTIAL NEPHRECTOMY Right 09/08/2015   Procedure: XI ROBOTIC ASSITED RIGHT PARTIAL NEPHRECTOMY;  Surgeon: [name], MD;  [facility];  Service: Urology;  Laterality: Right;  Clamp on: 1033 Clamp off: 1103 Total Clamp Time: 30 minutes  . SHOULDER ARTHROSCOPY WITH SUBACROMIAL DECOMPRESSION Left 01/20/2016   Procedure: LEFT SHOULDER ARTHROSCOPY WITH EXTENSIVE  DEBRIDEMENT, ACROMIOPLASTY;  Surgeon: Ninetta Lights, MD;  Location: [place];  Service: Orthopedics;  Laterality: Left;    . SHOULDER SURGERY  LFT  . Stress Cardiolite  12/24/2003   negative for ischemia  . UPPER ESOPHAGEAL ENDOSCOPIC ULTRASOUND (EUS)  04/18/2016   [facility] hospital  . UPPER GASTROINTESTINAL ENDOSCOPY    . WOUND EXPLORATION     management of a wound that he sustained in the [place] History  . 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 Smoking status: Never Smoker  . Smokeless tobacco: Never Used  Substance and Sexual Activity  . Alcohol use: Yes    Alcohol/week: 0.0 oz    Comment: occasional  . Drug use: No  . Sexual activity: Not Currently    Partners: Female

## 2017-08-15 NOTE — Patient Instructions (Signed)
Avoid frequent bending and stooping  No lifting greater than 10 lbs. May use ice or moist heat for pain. Weight loss is of benefit.  Stretching exercises of the left thumb in extension, use transdermal arthritis meds 3-4 times a day.

## 2017-08-16 ENCOUNTER — Ambulatory Visit: Payer: Medicare Other | Attending: Urology | Admitting: Physical Therapy

## 2017-08-16 DIAGNOSIS — M25651 Stiffness of right hip, not elsewhere classified: Secondary | ICD-10-CM

## 2017-08-16 DIAGNOSIS — M545 Low back pain: Secondary | ICD-10-CM | POA: Insufficient documentation

## 2017-08-16 DIAGNOSIS — R293 Abnormal posture: Secondary | ICD-10-CM | POA: Diagnosis not present

## 2017-08-16 DIAGNOSIS — M546 Pain in thoracic spine: Secondary | ICD-10-CM | POA: Diagnosis not present

## 2017-08-16 DIAGNOSIS — G8929 Other chronic pain: Secondary | ICD-10-CM | POA: Diagnosis not present

## 2017-08-16 NOTE — Therapy (Addendum)
[place], [place], [address] Phone: [phone]   Fax:  [phone]  Physical Therapy Evaluation/Discharge (Addended)   Patient Details  Name: DONNIE PANIK MRN: [id number] Date of Birth: 03-28-1946 Referring Provider: Dr. Louis Meckel    Encounter Date: 08/16/2017  PT End of Session - 08/16/17 1530    Visit Number  1    Number of Visits  12    PT Start Time  1450    PT Stop Time  1535    PT Time Calculation (min)  45 min    Activity Tolerance  Patient tolerated treatment well    Behavior During Therapy  [facility] for tasks assessed/performed       Past Medical History:  Diagnosis Date  . ADENOCARCINOMA, PROSTATE, GLEASON GRADE 6 12/21/2009   prostate cancer  . ALLERGIC RHINITIS 03/16/2007  . Allergy   . Anxiety   . Arthritis    back   . BACK PAIN WITH RADICULOPATHY 11/23/2008  . Cancer of kidney ([place])    partial right kidney removed  . CARPAL TUNNEL SYNDROME, BILATERAL 03/16/2007  . Chronic back pain   . Constipation    takes Senokot daily  . Depression    occasionally  . DIABETES MELLITUS, TYPE II 03/16/2007   takes Januvia and MEtformin daily  . GASTROINTESTINAL HEMORRHAGE, HX OF 03/16/2007  . GERD 03/16/2007   takes Omeprazole daily  . Glaucoma    mild - no eye drops  . Hemorrhoids   . History of colon polyps   . HYPERLIPIDEMIA 03/16/2007   takes Crestor daily  . HYPERTENSION 03/16/2007   takes Diltiazem and Lisinopril daily   . LEG CRAMPS 05/22/2007  . Nocturia   . Overactive bladder   . PARESTHESIA 05/21/2007  . Proctitis   . Rectal bleeding    Dr.Norins has explained its from the Radiation that he has received  . Rectal bleeding   . Sleep apnea    uses CPAP nightly  . Stomach cancer ([place])   . Urinary frequency    takes Toviaz daily    Past Surgical History:  Procedure Laterality Date  . [place], 2008   repeat surgery; [facility] '08  . CARPAL TUNNEL RELEASE     bilateral  .  CERVICAL FUSION    . COLONOSCOPY    . colonosocpy    . ESOPHAGOGASTRODUODENOSCOPY     ED  . FLEXIBLE SIGMOIDOSCOPY N/A 01/08/2017   Procedure: FLEXIBLE SIGMOIDOSCOPY;  Surgeon: Irene Shipper, MD;  Location: [facility] ENDOSCOPY;  Service: Endoscopy;  Laterality: N/A;  . HOT HEMOSTASIS N/A 01/08/2017   Procedure: HOT HEMOSTASIS (ARGON PLASMA COAGULATION/BICAP);  Surgeon: Irene Shipper, MD;  Location: [facility] ENDOSCOPY;  Service: Endoscopy;  Laterality: N/A;  . LUMBAR LAMINECTOMY  10/30/2011   Procedure: MICRODISCECTOMY LUMBAR LAMINECTOMY;  Surgeon: Jessy Oto, MD;  Location: [place];  Service: Orthopedics;  Laterality: Right;  Right L4-5 and L5-S1 Microdiscectomy  . LUMBAR LAMINECTOMY  06/17/2012   Procedure: MICRODISCECTOMY LUMBAR LAMINECTOMY;  Surgeon: Jessy Oto, MD;  Location: [place];  Service: Orthopedics;  Laterality: N/A;  Right L5-S1 microdiscectomy  . POLYPECTOMY    . PROSTATE SURGERY  march 2012   seed implant  . ROBOTIC ASSITED PARTIAL NEPHRECTOMY Right 09/08/2015   Procedure: XI ROBOTIC ASSITED RIGHT PARTIAL NEPHRECTOMY;  Surgeon: [name], MD;  Location: [facility];  Service: Urology;  Laterality: Right;  Clamp on: 1033 Clamp off: 1103 Total Clamp Time: 30 minutes  .  SHOULDER ARTHROSCOPY WITH SUBACROMIAL DECOMPRESSION Left 01/20/2016   Procedure: LEFT SHOULDER ARTHROSCOPY WITH EXTENSIVE  DEBRIDEMENT, ACROMIOPLASTY;  Surgeon: Ninetta Lights, MD;  Location: [place];  Service: Orthopedics;  Laterality: Left;  . SHOULDER SURGERY  LFT  . Stress Cardiolite  12/24/2003   negative for ischemia  . UPPER ESOPHAGEAL ENDOSCOPIC ULTRASOUND (EUS)  04/18/2016   [facility] hospital  . UPPER GASTROINTESTINAL ENDOSCOPY    . 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   Subjective Assessment - 08/16/17 1454    Subjective  About 2 yrs ago he had kidney surgery (R) .  He has had increased pain especially on Rt.  side.  Patient reports taking it easy.  He avoid heavier lifting and modifies your activity.      Pertinent History  shoulder surgery, back surgery (laminectomy)    Limitations  Lifting;Standing;Walking;House hold activities;Other (comment);Sitting sleeping    How long can you sit comfortably?  sometimes, depends on the chair    Diagnostic tests  MRI     Patient Stated Goals  Patient would like to get stronger, less pain.     Currently in Pain?  Yes    Pain Score  4     Pain Location  Back    Pain Orientation  Right;Mid;Lower    Pain Descriptors / Indicators  Tightness    Pain Type  Chronic pain    Pain Onset  More than a month ago    Pain Frequency  Intermittent    Aggravating Factors   activity, laying on R side     Pain Relieving Factors  rest , pain meds     Effect of Pain on Daily Activities  limits his activity          [facility] PT Assessment - 08/16/17 0001      Assessment   Medical Diagnosis  thoracic pain     Referring Provider  Dr. Louis Meckel     Onset Date/Surgical Date  -- chronic    Prior Therapy  Recalls some after his surgery       Precautions   Precautions  None      Restrictions   Weight Bearing Restrictions  No      Balance Screen   Has the patient fallen in the past 6 months  No    Has the patient had a decrease in activity level because of a fear of falling?   No    Is the patient reluctant to leave their home because of a fear of falling?   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 (Rt hip higher)       AROM   Lumbar Flexion  25%     Lumbar Extension  25% min pain     Lumbar - Right Side Bend  pain on R     Lumbar - Left Side Bend  pain on R     Lumbar - Right Rotation  50% no pain    Lumbar - Left Rotation  50% no pain       PROM   Overall PROM Comments  Rt. PROM limited to 100 deg in Rt. hip flexion       Strength   Right Hip Flexion  3-/5    Right Hip ABduction  2+/5    Left Hip Flexion  3+/5    Right Knee Flexion  3+/5    Right Knee Extension  4+/5    Left Knee Flexion  5/5    Left Knee Extension  5/5    Right Ankle Dorsiflexion  4/5    Left Ankle Dorsiflexion  5/5      Flexibility   Hamstrings  Rt pain at 30 deg, tight bilat.       Palpation   Spinal mobility  NT in prone     Palpation comment  painful Rt quadratus lumborus painful, spasm       Special Tests    Special Tests  Lumbar    Lumbar Tests  Straight Leg Raise      Straight Leg Raise   Findings  Positive    Side   Right    Comment  pain 25-30 deg                 Objective measurements completed on examination: See above findings.        PT Long Term Goals - 08/16/17 1533      PT LONG TERM GOAL #1   Title  Pt will be I with HEP for trunk flexibility, core strength.     Time  6    Period  Weeks    Status  New    Target Date  09/27/17      PT LONG TERM GOAL #2   Title  Pt will be able to sleep comfortably (in any positon) not limited by pain 3 -4 nights of the week     Time  6     Period  Weeks    Status  New    Target Date  09/27/17      PT LONG TERM GOAL #3   Title  FOTO score will improve to less than 38%    Time  6    Period  Weeks    Status  New    Target Date  09/27/17      PT LONG TERM GOAL #4   Title  Pt will be able to walk with greater ease, less pain in back in the community without assistive device for about 30 min     Time  6    Period  Weeks  Status  New    Target Date  09/27/17      PT LONG TERM GOAL #5   Title  Pt will be able to demo proper body mechanics and lifting technique to preserve spinal integrity    Time  6    Period  Weeks    Status  New    Target Date  09/27/17             Plan - 08/16/17 1543    Clinical Impression Statement  Patient presents for mod complexity eval for patient with chronic back pain, today localized to Rt. upper middle lumbar/Quadratus lumborum.  He does have some changes in his most recent MRI from his previous one.  He has Rt. LE weakness which may have altered his gait, thrus producing  chronic increased muscle tone on his Rt. side.      History and Personal Factors relevant to plan of care:  neuropathy, DM, kidney surgery, back pain chronic and surgery x 2, shoulder surgery x 2     Clinical Presentation  Evolving    Clinical Presentation due to:  intermittent numbness and Rt. sided weakness     Clinical Decision Making  Moderate    Rehab Potential  Excellent    PT Frequency  2x / week    PT Duration  6 weeks    PT Treatment/Interventions  Electrical Stimulation;ADLs/Self Care Home Management;Functional mobility training;Neuromuscular re-education;Taping;Therapeutic activities;Moist Heat;Ultrasound;Cryotherapy;Patient/family education;Therapeutic exercise;Manual techniques;Passive range of motion;Gait training    PT Next Visit Plan  check flexibility HEP , soft tissue to Rt QL     PT Home Exercise Plan  knee to chest, childs pose, lateral trunk (Rt QL) stretch , lower trunk rot.     Consulted  and Agree with Plan of Care  Patient       Patient will benefit from skilled therapeutic intervention in order to improve the following deficits and impairments:  Pain, Postural dysfunction, Impaired flexibility, Increased fascial restricitons, Decreased strength, Improper body mechanics, Obesity, Decreased range of motion, Impaired sensation, Difficulty walking, Abnormal gait, Decreased balance, Decreased mobility  Visit Diagnosis: Pain in thoracic spine  Chronic right-sided low back pain, with sciatica presence unspecified  Stiffness of right hip, not elsewhere classified  Abnormal posture     Problem List Patient Active Problem List   Diagnosis Date Noted  . Hearing loss 05/21/2017  . Seasonal allergic rhinitis due to pollen 11/01/2016  . Microalbuminuria due to type 2 diabetes mellitus ([place]) 07/27/2016  . Renal cell carcinoma ([place]) 10/06/2015  . Varicose veins of both lower extremities 08/06/2015  . Varicose veins of lower extremities with ulcer ([place]) 06/16/2015  . Other constipation 08/03/2014  . Internal hemorrhoid, bleeding 08/03/2014  . Post traumatic stress disorder (PTSD) 01/29/2014  . Deficiency anemia 12/10/2013  . Radiation proctitis 06/04/2012  . Spinal stenosis of lumbar region with radiculopathy 10/30/2011    Class: Chronic  . OSA (obstructive sleep apnea) 11/24/2010  . Memory difficulty 11/24/2010  . Obesity, Class I, BMI 30-34.9 11/24/2010  . Routine general medical examination at a health care facility 11/24/2010  . ADENOCARCINOMA, PROSTATE, GLEASON GRADE 6 12/21/2009  . Type II diabetes mellitus with manifestations ([place]) 03/16/2007  . Hyperlipidemia with target LDL less than 100 03/16/2007  . Essential hypertension 03/16/2007  . GERD 03/16/2007    PAA,JENNIFER 08/16/2017, 4:04 PM  [facility] [address] [place], [place], [address] Phone: [phone]   Fax:  [phone]  Name: Kazuki Ingle  [place] MRN: [id number] Date of Birth: Feb 13, 1946  Raeford Razor, PT 08/16/17 4:04 PM Phone: [phone] Fax: [phone]  PHYSICAL THERAPY DISCHARGE SUMMARY  Visits from Start of Care: 1  Current functional level related to goals / functional outcomes: Unknown    Remaining deficits: Unknown, did not return    Education / Equipment: HEP Plan: Patient agrees to discharge.  Patient goals were not met. Patient is being discharged due to not returning since the last visit.  ?????    Raeford Razor, PT 10/25/17 2:41 PM Phone: [phone] Fax: [phone]

## 2017-08-26 ENCOUNTER — Other Ambulatory Visit: Payer: Self-pay | Admitting: Internal Medicine

## 2017-08-26 DIAGNOSIS — Z794 Long term (current) use of insulin: Secondary | ICD-10-CM

## 2017-08-26 DIAGNOSIS — R809 Proteinuria, unspecified: Principal | ICD-10-CM

## 2017-08-26 DIAGNOSIS — I10 Essential (primary) hypertension: Secondary | ICD-10-CM

## 2017-08-26 DIAGNOSIS — E118 Type 2 diabetes mellitus with unspecified complications: Secondary | ICD-10-CM

## 2017-08-26 DIAGNOSIS — E1129 Type 2 diabetes mellitus with other diabetic kidney complication: Secondary | ICD-10-CM

## 2017-08-28 ENCOUNTER — Ambulatory Visit: Payer: Medicare Other | Admitting: Physical Therapy

## 2017-08-29 ENCOUNTER — Ambulatory Visit: Payer: Medicare Other | Admitting: Physical Therapy

## 2017-09-04 ENCOUNTER — Encounter: Payer: Medicare Other | Admitting: Physical Therapy

## 2017-09-07 ENCOUNTER — Encounter: Payer: Medicare Other | Admitting: Physical Therapy

## 2017-09-10 ENCOUNTER — Encounter: Payer: Medicare Other | Admitting: Physical Therapy

## 2017-09-12 ENCOUNTER — Encounter: Payer: Medicare Other | Admitting: Physical Therapy

## 2017-09-14 ENCOUNTER — Ambulatory Visit (INDEPENDENT_AMBULATORY_CARE_PROVIDER_SITE_OTHER): Payer: Medicare Other | Admitting: Specialist

## 2017-09-17 ENCOUNTER — Encounter: Payer: Medicare Other | Admitting: Physical Therapy

## 2017-09-19 ENCOUNTER — Encounter: Payer: Medicare Other | Admitting: Physical Therapy

## 2017-10-15 ENCOUNTER — Encounter: Payer: Self-pay | Admitting: Neurology

## 2017-10-15 ENCOUNTER — Other Ambulatory Visit: Payer: Self-pay

## 2017-10-15 ENCOUNTER — Ambulatory Visit: Payer: Medicare Other | Admitting: Neurology

## 2017-10-15 VITALS — BP 154/78 | HR 94 | Ht 71.0 in | Wt 219.0 lb

## 2017-10-15 DIAGNOSIS — R413 Other amnesia: Secondary | ICD-10-CM | POA: Diagnosis not present
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
## 2017-10-15 NOTE — Patient Instructions (Signed)
Great meeting you. Follow-up in 6 months, call for any changes  RECOMMENDATIONS FOR ALL PATIENTS WITH MEMORY PROBLEMS: 1. Continue to exercise (Recommend 30 minutes of walking everyday, or 3 hours every week) 2. Increase social interactions - continue going to [place] and enjoy social gatherings with friends and family 3. Eat healthy, avoid fried foods and eat more fruits and vegetables 4. Maintain adequate blood pressure, blood sugar, and blood cholesterol level. Reducing the risk of stroke and cardiovascular disease also helps promoting better memory. 5. Avoid stressful situations. Live a simple life and avoid aggravations. Organize your time and prepare for the next day in anticipation. 6. Sleep well, avoid any interruptions of sleep and avoid any distractions in the bedroom that may interfere with adequate sleep quality 7. Avoid sugar, avoid sweets as there is a strong link between excessive sugar intake, diabetes, and cognitive impairment We discussed the Mediterranean diet, which has been shown to help patients reduce the risk of progressive memory disorders and reduces cardiovascular risk. This includes eating fish, eat fruits and green leafy vegetables, nuts like almonds and hazelnuts, walnuts, and also use olive oil. Avoid fast foods and fried foods as much as possible. Avoid sweets and sugar as sugar use has been linked to worsening of memory function.

## 2017-10-18 ENCOUNTER — Ambulatory Visit (INDEPENDENT_AMBULATORY_CARE_PROVIDER_SITE_OTHER): Payer: Medicare Other | Admitting: Specialist

## 2017-10-18 ENCOUNTER — Encounter (INDEPENDENT_AMBULATORY_CARE_PROVIDER_SITE_OTHER): Payer: Self-pay | Admitting: Specialist

## 2017-10-18 VITALS — BP 143/84 | HR 89 | Ht 71.0 in | Wt 224.0 lb

## 2017-10-18 DIAGNOSIS — M25512 Pain in left shoulder: Secondary | ICD-10-CM

## 2017-10-18 DIAGNOSIS — M19012 Primary osteoarthritis, left shoulder: Secondary | ICD-10-CM | POA: Diagnosis not present

## 2017-10-18 DIAGNOSIS — G8929 Other chronic pain: Secondary | ICD-10-CM

## 2017-10-18 MED ORDER — TRAMADOL HCL 50 MG PO TABS: 50.0000 mg | ORAL_TABLET | Freq: Four times a day (QID) | ORAL | 0 refills | Status: DC | PRN

## 2017-10-18 NOTE — Progress Notes (Signed)
Office Visit Note   Patient: Antonio Adams           Date of Birth: 01-Nov-1945           MRN: [id number] Visit Date: 10/18/2017              Requested by: [name], MD [address] [place], [place] [address] PCP: [name], MD   Assessment & Plan: Visit Diagnoses:  1. Chronic left shoulder pain   2. Primary osteoarthritis, left shoulder     Plan: Call if you need [place] to inject the left shoulder or for refill of tramadol. You should not take NSAIDs due to history of partial loss of kidney function.  Call if you want [place] to arrange for the left shoulder artificial replacement with Dr. Marlou Sa.   Follow-Up Instructions: Return if symptoms worsen or fail to improve.   Orders:  No orders of the defined types were placed in this encounter.  Meds ordered this encounter  Medications  . traMADol (ULTRAM) 50 MG tablet    Sig: Take 1 tablet (50 mg total) by mouth every 6 (six) hours as needed.    Dispense:  40 tablet    Refill:  0      Procedures: No procedures performed   Clinical Data: No additional findings.   Subjective: Chief Complaint  Patient presents with  . 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 He had a left shoulder SAS injection with good relief. He relates that his wife recently passed away, unexpected. He feels like the left shoulder is chronic and it is better than before the shots.   Review of Systems  Constitutional: Negative.   HENT: Negative.   Eyes: Negative.   Respiratory: Negative.   Cardiovascular: Negative.   Gastrointestinal: Negative.   Endocrine: Negative.   Genitourinary: Negative.   Musculoskeletal: Negative.   Skin: Negative.   Allergic/Immunologic: Negative.   Neurological: Negative.   Hematological: Negative.   Psychiatric/Behavioral: Negative.      Objective: Vital Signs: BP (!)  143/84 (BP Location: Left Arm, Patient Position: Sitting)   Pulse 89   Ht 5\' 11"  (1.803 m)   Wt 224 lb (101.6 kg)   BMI 31.24 kg/m   Physical Exam  Constitutional: He is oriented to person, place, and time. He appears well-developed and well-nourished.  HENT:  Head: Normocephalic and atraumatic.  Eyes: Pupils are equal, round, and reactive to light. EOM are normal.  Neck: Normal range of motion. Neck supple.  Pulmonary/Chest: Effort normal and breath sounds normal.  Abdominal: Soft. Bowel sounds are normal.  Musculoskeletal: Normal range of motion.  Neurological: He is alert and oriented to person, place, and time.  Skin: Skin is warm and dry.  Psychiatric: He has a normal mood and affect. His behavior is normal. Judgment and thought content normal.    Ortho Exam  Specialty Comments:  No specialty comments available.  Imaging: No results found.   PMFS History: Patient Active Problem List   Diagnosis Date Noted  . Spinal stenosis of lumbar region with radiculopathy 10/30/2011    Priority: High    Class: Chronic  . Hearing loss 05/21/2017  . Seasonal allergic rhinitis due to pollen 11/01/2016  . Microalbuminuria due to type 2 diabetes mellitus ([place]) 07/27/2016  . Renal cell carcinoma ([place]) 10/06/2015  .  Varicose veins of both lower extremities 08/06/2015  . Varicose veins of lower extremities with ulcer ([place]) 06/16/2015  . Other constipation 08/03/2014  . Internal hemorrhoid, bleeding 08/03/2014  . Post traumatic stress disorder (PTSD) 01/29/2014  . Deficiency anemia 12/10/2013  . Radiation proctitis 06/04/2012  . OSA (obstructive sleep apnea) 11/24/2010  . Memory difficulty 11/24/2010  . Obesity, Class I, BMI 30-34.9 11/24/2010  . Routine general medical examination at a health care facility 11/24/2010  . ADENOCARCINOMA, PROSTATE, GLEASON GRADE 6 12/21/2009  . Type II diabetes mellitus with manifestations ([place]) 03/16/2007  . Hyperlipidemia with target LDL less than  100 03/16/2007  . Essential hypertension 03/16/2007  . GERD 03/16/2007   Past Medical History:  Diagnosis Date  . ADENOCARCINOMA, PROSTATE, GLEASON GRADE 6 12/21/2009   prostate cancer  . ALLERGIC RHINITIS 03/16/2007  . Allergy   . Anxiety   . Arthritis    back   . BACK PAIN WITH RADICULOPATHY 11/23/2008  . Cancer of kidney ([place])    partial right kidney removed  . CARPAL TUNNEL SYNDROME, BILATERAL 03/16/2007  . Chronic back pain   . Constipation    takes Senokot daily  . Depression    occasionally  . DIABETES MELLITUS, TYPE II 03/16/2007   takes Januvia and MEtformin daily  . GASTROINTESTINAL HEMORRHAGE, HX OF 03/16/2007  . GERD 03/16/2007   takes Omeprazole daily  . Glaucoma    mild - no eye drops  . Hemorrhoids   . History of colon polyps   . HYPERLIPIDEMIA 03/16/2007   takes Crestor daily  . HYPERTENSION 03/16/2007   takes Diltiazem and Lisinopril daily   . LEG CRAMPS 05/22/2007  . Nocturia   . Overactive bladder   . PARESTHESIA 05/21/2007  . Proctitis   . Rectal bleeding    Dr.Norins has explained its from the Radiation that he has received  . Rectal bleeding   . Sleep apnea    uses CPAP nightly  . Stomach cancer ([place])   . Urinary frequency    takes Toviaz daily    Family History  Problem Relation Age of Onset  . Cancer Sister 30       Cancer, unsure type  . Hypertension Other   . Colon cancer Neg Hx   . Esophageal cancer Neg Hx   . Stomach cancer Neg Hx   . Diabetes Neg Hx   . Colon polyps Neg Hx   . Rectal cancer Neg Hx     Past Surgical History:  Procedure Laterality Date  . [place], 2008   repeat surgery; ESI '08  . CARPAL TUNNEL RELEASE     bilateral  . CERVICAL FUSION    . COLONOSCOPY    . colonosocpy    . ESOPHAGOGASTRODUODENOSCOPY     ED  . FLEXIBLE SIGMOIDOSCOPY N/A 01/08/2017   Procedure: FLEXIBLE SIGMOIDOSCOPY;  Surgeon: Irene Shipper, MD;  Location[facility] ENDOSCOPY;  Service: Endoscopy;  Laterality: N/A;  . HOT HEMOSTASIS N/A  01/08/2017   Procedure: HOT HEMOSTASIS (ARGON PLASMA COAGULATION/BICAP);  Surgeon: Irene Shipper, MD;  Location: [facility] ENDOSCOPY;  Service: Endoscopy;  Laterality: N/A;  . LUMBAR LAMINECTOMY  10/30/2011   Procedure: MICRODISCECTOMY LUMBAR LAMINECTOMY;  Surgeon: Jessy Oto, MD;  Location: [place];  Service: Orthopedics;  Laterality: Right;  Right L4-5 and L5-S1 Microdiscectomy  . LUMBAR LAMINECTOMY  06/17/2012   Procedure: MICRODISCECTOMY LUMBAR LAMINECTOMY;  Surgeon: Jessy Oto, MD;  Location: [place];  Service: Orthopedics;  Laterality: N/A;  Right L5-S1 microdiscectomy  . POLYPECTOMY    . PROSTATE SURGERY  march 2012   seed implant  . ROBOTIC ASSITED PARTIAL NEPHRECTOMY Right 09/08/2015   Procedure: XI ROBOTIC ASSITED RIGHT PARTIAL NEPHRECTOMY;  Surgeon: [name], MD;  Location[facility];  Service: Urology;  Laterality: Right;  Clamp on: 1033 Clamp off: 1103 Total Clamp Time: 30 minutes  . SHOULDER ARTHROSCOPY WITH SUBACROMIAL DECOMPRESSION Left 01/20/2016   Procedure: LEFT SHOULDER ARTHROSCOPY WITH EXTENSIVE  DEBRIDEMENT, ACROMIOPLASTY;  Surgeon: Ninetta Lights, MD;  Location: [place];  Service: Orthopedics;  Laterality: Left;  . SHOULDER SURGERY  LFT  . Stress Cardiolite  12/24/2003   negative for ischemia  . UPPER ESOPHAGEAL ENDOSCOPIC ULTRASOUND (EUS)  04/18/2016   [facility] hospital  . UPPER GASTROINTESTINAL ENDOSCOPY    . WOUND EXPLORATION     management of a wound that he sustained in the [place] History  . 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 Smoking status: Never Smoker  . Smokeless tobacco: Never Used  Substance and Sexual Activity  . Alcohol use: Yes    Alcohol/week: 0.0 oz    Comment: occasional  . Drug use: No  . Sexual activity: Not Currently    Partners: Female

## 2017-10-18 NOTE — Patient Instructions (Signed)
Call if you need [place] to inject the left shoulder or for refill of tramadol. You should not take NSAIDs due to history of partial loss of kidney function.  Call if you want [place] to arrange for the left shoulder artificial replacement with Dr. Marlou Sa.

## 2017-10-31 ENCOUNTER — Other Ambulatory Visit: Payer: Self-pay | Admitting: Internal Medicine

## 2017-10-31 DIAGNOSIS — D508 Other iron deficiency anemias: Secondary | ICD-10-CM

## 2017-11-12 ENCOUNTER — Other Ambulatory Visit: Payer: Self-pay | Admitting: Internal Medicine

## 2017-11-17 ENCOUNTER — Other Ambulatory Visit: Payer: Self-pay | Admitting: Internal Medicine

## 2017-11-17 DIAGNOSIS — E118 Type 2 diabetes mellitus with unspecified complications: Secondary | ICD-10-CM

## 2017-11-17 DIAGNOSIS — Z794 Long term (current) use of insulin: Principal | ICD-10-CM

## 2017-11-22 ENCOUNTER — Other Ambulatory Visit: Payer: Self-pay | Admitting: Internal Medicine

## 2017-11-22 DIAGNOSIS — E118 Type 2 diabetes mellitus with unspecified complications: Secondary | ICD-10-CM

## 2017-11-22 DIAGNOSIS — R809 Proteinuria, unspecified: Principal | ICD-10-CM

## 2017-11-22 DIAGNOSIS — Z794 Long term (current) use of insulin: Secondary | ICD-10-CM

## 2017-11-22 DIAGNOSIS — E1129 Type 2 diabetes mellitus with other diabetic kidney complication: Secondary | ICD-10-CM

## 2017-11-22 DIAGNOSIS — I10 Essential (primary) hypertension: Secondary | ICD-10-CM

## 2017-12-13 ENCOUNTER — Other Ambulatory Visit: Payer: Self-pay | Admitting: Internal Medicine

## 2017-12-13 DIAGNOSIS — E785 Hyperlipidemia, unspecified: Secondary | ICD-10-CM

## 2017-12-28 DIAGNOSIS — C642 Malignant neoplasm of left kidney, except renal pelvis: Secondary | ICD-10-CM | POA: Diagnosis not present

## 2018-01-03 ENCOUNTER — Encounter: Payer: Self-pay | Admitting: Internal Medicine

## 2018-01-03 ENCOUNTER — Ambulatory Visit (INDEPENDENT_AMBULATORY_CARE_PROVIDER_SITE_OTHER): Payer: Medicare Other | Admitting: Internal Medicine

## 2018-01-03 ENCOUNTER — Other Ambulatory Visit (INDEPENDENT_AMBULATORY_CARE_PROVIDER_SITE_OTHER): Payer: Medicare Other

## 2018-01-03 VITALS — BP 144/90 | HR 88 | Temp 97.9°F | Wt 216.0 lb

## 2018-01-03 DIAGNOSIS — Z0001 Encounter for general adult medical examination with abnormal findings: Secondary | ICD-10-CM

## 2018-01-03 DIAGNOSIS — R809 Proteinuria, unspecified: Secondary | ICD-10-CM

## 2018-01-03 DIAGNOSIS — D539 Nutritional anemia, unspecified: Secondary | ICD-10-CM

## 2018-01-03 DIAGNOSIS — I10 Essential (primary) hypertension: Secondary | ICD-10-CM

## 2018-01-03 DIAGNOSIS — R413 Other amnesia: Secondary | ICD-10-CM

## 2018-01-03 DIAGNOSIS — E1129 Type 2 diabetes mellitus with other diabetic kidney complication: Secondary | ICD-10-CM

## 2018-01-03 DIAGNOSIS — E118 Type 2 diabetes mellitus with unspecified complications: Secondary | ICD-10-CM | POA: Diagnosis not present

## 2018-01-03 DIAGNOSIS — E785 Hyperlipidemia, unspecified: Secondary | ICD-10-CM

## 2018-01-03 DIAGNOSIS — Z794 Long term (current) use of insulin: Secondary | ICD-10-CM

## 2018-01-03 DIAGNOSIS — Z0000 Encounter for general adult medical examination without abnormal findings: Secondary | ICD-10-CM

## 2018-01-03 LAB — CBC WITH DIFFERENTIAL/PLATELET
BASOS ABS: 0 10*3/uL (ref 0.0–0.1)
Basophils Relative: 0.8 % (ref 0.0–3.0)
EOS ABS: 0.1 10*3/uL (ref 0.0–0.7)
Eosinophils Relative: 1.6 % (ref 0.0–5.0)
HCT: 35.5 % — ABNORMAL LOW (ref 39.0–52.0)
Hemoglobin: 12.2 g/dL — ABNORMAL LOW (ref 13.0–17.0)
LYMPHS ABS: 0.9 10*3/uL (ref 0.7–4.0)
Lymphocytes Relative: 27.6 % (ref 12.0–46.0)
MCHC: 34.3 g/dL (ref 30.0–36.0)
MCV: 86.3 fl (ref 78.0–100.0)
MONO ABS: 0.5 10*3/uL (ref 0.1–1.0)
MONOS PCT: 13.6 % — AB (ref 3.0–12.0)
NEUTROS ABS: 1.9 10*3/uL (ref 1.4–7.7)
NEUTROS PCT: 56.4 % (ref 43.0–77.0)
PLATELETS: 210 10*3/uL (ref 150.0–400.0)
RBC: 4.12 Mil/uL — ABNORMAL LOW (ref 4.22–5.81)
RDW: 14.9 % (ref 11.5–15.5)
WBC: 3.4 10*3/uL — ABNORMAL LOW (ref 4.0–10.5)

## 2018-01-03 LAB — COMPREHENSIVE METABOLIC PANEL
ALT: 17 U/L (ref 0–53)
AST: 13 U/L (ref 0–37)
Albumin: 4.5 g/dL (ref 3.5–5.2)
Alkaline Phosphatase: 88 U/L (ref 39–117)
BILIRUBIN TOTAL: 0.3 mg/dL (ref 0.2–1.2)
BUN: 24 mg/dL — AB (ref 6–23)
CO2: 25 meq/L (ref 19–32)
Calcium: 10.2 mg/dL (ref 8.4–10.5)
Chloride: 105 mEq/L (ref 96–112)
Creatinine, Ser: 1.8 mg/dL — ABNORMAL HIGH (ref 0.40–1.50)
GFR: 47.97 mL/min — AB (ref >60.00)
GLUCOSE: 131 mg/dL — AB (ref 70–99)
Potassium: 4.3 mEq/L (ref 3.5–5.1)
SODIUM: 136 meq/L (ref 135–145)
Total Protein: 7.5 g/dL (ref 6.0–8.3)

## 2018-01-03 LAB — TSH: TSH: 1.38 u[IU]/mL (ref 0.35–4.50)

## 2018-01-03 LAB — URINALYSIS, ROUTINE W REFLEX MICROSCOPIC
BILIRUBIN URINE: NEGATIVE
HGB URINE DIPSTICK: NEGATIVE
Ketones, ur: NEGATIVE
Leukocytes, UA: NEGATIVE
NITRITE: NEGATIVE
RBC / HPF: NONE SEEN (ref 0–?)
Specific Gravity, Urine: 1.015 (ref 1.000–1.030)
TOTAL PROTEIN, URINE-UPE24: 30 — AB
Urine Glucose: NEGATIVE
Urobilinogen, UA: 0.2 (ref 0.0–1.0)
pH: 6 (ref 5.0–8.0)

## 2018-01-03 LAB — LIPID PANEL
Cholesterol: 167 mg/dL (ref 0–200)
HDL: 36.7 mg/dL — AB (ref >39.00)
LDL CALC: 113 mg/dL — AB (ref 0–99)
NONHDL: 130.54
TRIGLYCERIDES: 89 mg/dL (ref 0.0–149.0)
Total CHOL/HDL Ratio: 5
VLDL: 17.8 mg/dL (ref 0.0–40.0)

## 2018-01-03 LAB — MICROALBUMIN / CREATININE URINE RATIO
Creatinine,U: 59.7 mg/dL
MICROALB/CREAT RATIO: 47.8 mg/g — AB (ref 0.0–30.0)
Microalb, Ur: 28.5 mg/dL — ABNORMAL HIGH (ref 0.0–1.9)

## 2018-01-03 LAB — HEMOGLOBIN A1C: Hgb A1c MFr Bld: 7.3 % — ABNORMAL HIGH (ref 4.6–6.5)

## 2018-01-03 MED ORDER — AZILSARTAN-CHLORTHALIDONE 40-12.5 MG PO TABS: 1.0000 | ORAL_TABLET | Freq: Every day | ORAL | 1 refills | Status: DC

## 2018-01-03 MED ORDER — ATORVASTATIN CALCIUM 40 MG PO TABS: 40.0000 mg | ORAL_TABLET | Freq: Every day | ORAL | 1 refills | Status: DC

## 2018-01-03 NOTE — Progress Notes (Signed)
Subjective:  Patient ID: Antonio Adams, male    DOB: 1946-02-16  Age: 72 y.o. MRN: [id number]  CC: Annual Exam; Anemia; Hypertension; Diabetes; and Hyperlipidemia   HPI [name] presents for a CPX.  He is not taking any oral meds for diabetes.  He is controlling his blood sugar with insulin injections.  He thinks his blood sugars have recently been well controlled.  He denies polys.  He is compliant with the ARB but says his blood pressure has not been adequately well controlled.  He denies any recent episodes of headache, blurred vision, chest pain, shortness of breath, palpitations, edema, or fatigue.   Past Medical History:  Diagnosis Date  . ADENOCARCINOMA, PROSTATE, GLEASON GRADE 6 12/21/2009   prostate cancer  . ALLERGIC RHINITIS 03/16/2007  . Allergy   . Anxiety   . Arthritis    back   . BACK PAIN WITH RADICULOPATHY 11/23/2008  . Cancer of kidney ([name])    partial right kidney removed  . CARPAL TUNNEL SYNDROME, BILATERAL 03/16/2007  . Chronic back pain   . Constipation    takes Senokot daily  . Depression    occasionally  . DIABETES MELLITUS, TYPE II 03/16/2007   takes Januvia and MEtformin daily  . GASTROINTESTINAL HEMORRHAGE, HX OF 03/16/2007  . GERD 03/16/2007   takes Omeprazole daily  . Glaucoma    mild - no eye drops  . Hemorrhoids   . History of colon polyps   . HYPERLIPIDEMIA 03/16/2007   takes Crestor daily  . HYPERTENSION 03/16/2007   takes Diltiazem and Lisinopril daily   . LEG CRAMPS 05/22/2007  . Nocturia   . Overactive bladder   . PARESTHESIA 05/21/2007  . Proctitis   . Rectal bleeding    Dr.Norins has explained its from the Radiation that he has received  . Rectal bleeding   . Sleep apnea    uses CPAP nightly  . Stomach cancer ([place])   . Urinary frequency    takes Toviaz daily   Past Surgical History:  Procedure Laterality Date  . [place], 2008   repeat surgery; ESI '08  . CARPAL TUNNEL RELEASE     bilateral  .  CERVICAL FUSION    . COLONOSCOPY    . colonosocpy    . ESOPHAGOGASTRODUODENOSCOPY     ED  . FLEXIBLE SIGMOIDOSCOPY N/A 01/08/2017   Procedure: FLEXIBLE SIGMOIDOSCOPY;  Surgeon: Irene Shipper, MD;  Location[facility]COPY;  Service: Endoscopy;  Laterality: N/A;  . HOT HEMOSTASIS N/A 01/08/2017   Procedure: HOT HEMOSTASIS (ARGON PLASMA COAGULATION/BICAP);  Surgeon: Irene Shipper, MD;  Location: [facility] ENDOSCOPY;  Service: Endoscopy;  Laterality: N/A;  . LUMBAR LAMINECTOMY  10/30/2011   Procedure: MICRODISCECTOMY LUMBAR LAMINECTOMY;  Surgeon: Jessy Oto, MD;  Location: [place];  Service: Orthopedics;  Laterality: Right;  Right L4-5 and L5-S1 Microdiscectomy  . LUMBAR LAMINECTOMY  06/17/2012   Procedure: MICRODISCECTOMY LUMBAR LAMINECTOMY;  Surgeon: Jessy Oto, MD;  Location: [place];  Service: Orthopedics;  Laterality: N/A;  Right L5-S1 microdiscectomy  . POLYPECTOMY    . PROSTATE SURGERY  march 2012   seed implant  . ROBOTIC ASSITED PARTIAL NEPHRECTOMY Right 09/08/2015   Procedure: XI ROBOTIC ASSITED RIGHT PARTIAL NEPHRECTOMY;  Surgeon: [name], MD;  Location: [facility];  Service: Urology;  Laterality: Right;  Clamp on: 1033 Clamp off: 1103 Total Clamp Time: 30 minutes  . SHOULDER ARTHROSCOPY WITH SUBACROMIAL DECOMPRESSION Left 01/20/2016   Procedure: LEFT SHOULDER  ARTHROSCOPY WITH EXTENSIVE  DEBRIDEMENT, ACROMIOPLASTY;  Surgeon: Ninetta Lights, MD;  Location: [place];  Service: Orthopedics;  Laterality: Left;  . SHOULDER SURGERY  LFT  . Stress Cardiolite  12/24/2003   negative for ischemia  . UPPER ESOPHAGEAL ENDOSCOPIC ULTRASOUND (EUS)  04/18/2016   [facility] hospital  . UPPER GASTROINTESTINAL ENDOSCOPY    . WOUND EXPLORATION     management of a wound that he sustained in the [place]    reports that he has never smoked. He has never used smokeless tobacco. He reports that he drinks alcohol. He reports that he does not use drugs. family history includes Cancer (age of onset:  90) in his sister; Hypertension in his other. Allergies  Allergen Reactions  . Ace Inhibitors Swelling    Outpatient Medications Prior to Visit  Medication Sig Dispense Refill  . aspirin EC 81 MG tablet Take 81 mg by mouth daily.    . cholecalciferol (VITAMIN D) 1000 units tablet Take 1,000 Units by mouth daily.    . citalopram (CELEXA) 40 MG tablet Take 1 tablet by mouth daily.    . clotrimazole-betamethasone (LOTRISONE) cream APPLY 1 APPLICATION TOPICALLY 2 (TWO) TIMES DAILY. 30 g 0  . diclofenac sodium (VOLTAREN) 1 % GEL Apply 2 g topically 4 (four) times daily. 2 Tube 0  . ferrous sulfate 325 (65 FE) MG tablet TAKE 1 TABLET (325 MG TOTAL) BY MOUTH 2 (TWO) TIMES DAILY. 60 tablet 6  . gabapentin (NEURONTIN) 300 MG capsule Take 300 mg by mouth at bedtime.    . Insulin Pen Needle (NOVOFINE) 32G X 6 MM MISC 1 Act by Does not apply route daily. USE TID 100 each 11  . LANTUS SOLOSTAR 100 UNIT/ML Solostar Pen INJECT 50 UNITS INTO THE SKIN EVERY MORNING. 3 mL 1  . Needles & Syringes MISC Use syringe and needle to inject insulin into the skin as directed on insulin prescription. DX E11.09 90 each 3  . NOVOFINE PLUS 32G X 4 MM MISC USE DAILY WITH INSULIN 100 each 2  . omeprazole (PRILOSEC) 40 MG capsule TAKE ONE CAPSULE EVERY DAY 30 capsule 10  . ONE TOUCH ULTRA TEST test strip USE 2 (TWO) TIMES DAILY. AND LANCETS 2/DAY 250.03 100 each 11  . tamsulosin (FLOMAX) 0.4 MG CAPS capsule TAKE 1 CAPSULE (0.4 MG TOTAL) BY MOUTH DAILY. 30 capsule 1  . atorvastatin (LIPITOR) 40 MG tablet TAKE 1 TABLET BY MOUTH EVERY DAY 90 tablet 0  . Azilsartan-Chlorthalidone (EDARBYCLOR) 40-12.5 MG TABS Take 1 tablet by mouth daily. 35 tablet 0  . canagliflozin (INVOKANA) 100 MG TABS tablet Take 1 tablet (100 mg total) by mouth daily before breakfast. 90 tablet 1  . diltiazem (DILACOR XR) 120 MG 24 hr capsule Take 120 mg by mouth daily.      . Insulin Glargine (LANTUS SOLOSTAR) 100 UNIT/ML Solostar Pen Inject 70 Units into  the skin every morning. 15 mL 3  . Insulin Lispro (HUMALOG KWIKPEN) 200 UNIT/ML SOPN Inject 5 Units into the skin 3 (three) times daily after meals. (Patient not taking: Reported on 01/03/2018) 3 mL 11  . JANUMET XR 50-1000 MG TB24 TAKE 1 TABLET BY MOUTH DAILY. 90 tablet 1  . sertraline (ZOLOFT) 100 MG tablet Take 1 tablet by mouth daily. Reported on 08/06/2015    . telmisartan (MICARDIS) 40 MG tablet TAKE 1 TABLET (40 MG TOTAL) BY MOUTH DAILY. 90 tablet 0  . traMADol (ULTRAM) 50 MG tablet Take 1 tablet (50 mg total)  by mouth every 6 (six) hours as needed. 40 tablet 0  . triamcinolone (NASACORT) 55 MCG/ACT AERO nasal inhaler Place 2 sprays into the nose daily. (Patient not taking: Reported on 01/03/2018) 32.4 mL 3  . zolpidem (AMBIEN) 10 MG tablet Take 10 mg by mouth at bedtime as needed for sleep.      No facility-administered medications prior to visit.     ROS Review of Systems  Constitutional: Negative for appetite change, diaphoresis, fatigue and unexpected weight change.  HENT: Negative.  Negative for trouble swallowing.   Eyes: Negative for visual disturbance.  Respiratory: Negative.  Negative for cough, chest tightness, shortness of breath and wheezing.   Cardiovascular: Negative for chest pain, palpitations and leg swelling.  Gastrointestinal: Positive for anal bleeding. Negative for abdominal pain, blood in stool, constipation, diarrhea, nausea and vomiting.  Endocrine: Negative.  Negative for polydipsia, polyphagia and polyuria.  Genitourinary: Negative.  Negative for difficulty urinating and dysuria.  Musculoskeletal: Negative.  Negative for arthralgias and myalgias.  Skin: Negative.  Negative for color change, pallor and rash.  Neurological: Negative.  Negative for dizziness, weakness, light-headedness and headaches.  Hematological: Negative for adenopathy. Does not bruise/bleed easily.  Psychiatric/Behavioral: Negative.     Objective:  BP (!) 144/90   Pulse 88   Temp 97.9  F (36.6 C)   Wt 216 lb (98 kg)   SpO2 96%   BMI 30.13 kg/m   BP Readings from Last 3 Encounters:  01/03/18 (!) 144/90  10/18/17 (!) 143/84  10/15/17 (!) 154/78    Wt Readings from Last 3 Encounters:  01/03/18 216 lb (98 kg)  10/18/17 224 lb (101.6 kg)  10/15/17 219 lb (99.3 kg)    Physical Exam  Constitutional: He is oriented to person, place, and time. No distress.  HENT:  Mouth/Throat: Oropharynx is clear and moist. No oropharyngeal exudate.  Eyes: Conjunctivae are normal. No scleral icterus.  Neck: Normal range of motion. Neck supple. No JVD present. No thyromegaly present.  Cardiovascular: Normal rate, regular rhythm and normal heart sounds. Exam reveals no gallop and no friction rub.  No murmur heard. Pulmonary/Chest: Effort normal and breath sounds normal. No respiratory distress. He has no wheezes. He has no rales.  Abdominal: Soft. Normal appearance and bowel sounds are normal. He exhibits no mass. There is no hepatosplenomegaly. There is no tenderness.  Genitourinary:  Genitourinary Comments: GU and rectal exams were deferred at his request as he tells me he is seeing his urologist next week and these exams will be done at that time.  Musculoskeletal: Normal range of motion. He exhibits no edema, tenderness or deformity.  Lymphadenopathy:    He has no cervical adenopathy.  Neurological: He is alert and oriented to person, place, and time.  Skin: Skin is warm and dry. No rash noted. He is not diaphoretic.  Psychiatric: He has a normal mood and affect. Judgment and thought content normal. His mood appears not anxious. His speech is delayed. His speech is not tangential. He is slowed. Cognition and memory are impaired. He does not exhibit a depressed mood. He exhibits abnormal recent memory and abnormal remote memory.  Vitals reviewed.   Lab Results  Component Value Date   WBC 3.4 (L) 01/03/2018   HGB 12.2 (L) 01/03/2018   HCT 35.5 (L) 01/03/2018   PLT 210.0  01/03/2018   GLUCOSE 131 (H) 01/03/2018   CHOL 167 01/03/2018   TRIG 89.0 01/03/2018   HDL 36.70 (L) 01/03/2018   LDLCALC 113 (H)  01/03/2018   ALT 17 01/03/2018   AST 13 01/03/2018   NA 136 01/03/2018   K 4.3 01/03/2018   CL 105 01/03/2018   CREATININE 1.80 (H) 01/03/2018   BUN 24 (H) 01/03/2018   CO2 25 01/03/2018   TSH 1.38 01/03/2018   PSA 0.046 08/25/2016   INR 1.02 07/14/2010   HGBA1C 7.3 (H) 01/03/2018   MICROALBUR 28.5 (H) 01/03/2018    Mr Lumbar Spine Wo Contrast  Result Date: 07/14/2017 CLINICAL DATA:  Initial evaluation for chronic low back pain with right lower extremity numbness and weakness. EXAM: MRI LUMBAR SPINE WITHOUT CONTRAST TECHNIQUE: Multiplanar, multisequence MR imaging of the lumbar spine was performed. No intravenous contrast was administered. COMPARISON:  Prior radiograph from 06/26/2017 as well as previous MRI from 05/31/2015. FINDINGS: Segmentation: Normal segmentation. Lowest well-formed disc labeled the L5-S1 level. Alignment: Mild dextroscoliosis. Vertebral bodies otherwise normally aligned with preservation of the normal lumbar lordosis. Vertebrae: Vertebral body heights maintained without evidence for acute or chronic fracture. Mild reactive endplate changes present about the L2-3, L4-5, and L5-S1 interspaces. Underlying bone marrow signal intensity within normal limits. No discrete or worrisome osseous lesions. Conus medullaris and cauda equina: Conus extends to the L1 level. Conus and cauda equina appear normal. Paraspinal and other soft tissues: Paraspinous soft tissues within normal limits. Scattered T2 hyperintense cyst noted within the partially visualized kidneys. Possible post treatment changes noted at the posterior right kidney, incompletely visualized. Visualized visceral structures otherwise unremarkable. Disc levels: L1-2:  Unremarkable. L2-3: Diffuse circumferential disc bulge with disc desiccation. Previously seen left paracentral disc protrusion  has largely regressed in the interim. Mild facet and ligamentum flavum hypertrophy. Mild bilateral lateral recess narrowing without significant canal stenosis. Mild bilateral L2 foraminal stenosis. L3-4: Mild diffuse disc bulge with disc desiccation. Mild facet and ligamentum flavum hypertrophy. No significant spinal stenosis. Mild bilateral L3 foraminal stenosis, relatively similar to previous. L4-5: Diffuse circumferential disc bulge with disc desiccation. Disc bulging slightly eccentric to the left. Associated chronic reactive endplate changes with marginal endplate osteophytic spurring, also greater on the left. Moderate facet and ligamentum flavum hypertrophy, worse on the left. Trace left-sided joint effusion. Resultant moderate left lateral recess stenosis without significant canal narrowing. Moderate disc severe left L4 foraminal stenosis, similar to previous. Mild right L4 foraminal narrowing, also similar. L5-S1: Chronic intervertebral disc space narrowing with diffuse disc bulge and disc desiccation. Associated chronic reactive endplate changes with marginal endplate osteophytic spurring. Postoperative changes from prior right laminectomy with micro discectomy. Probable postoperative granulation tissue within the right lateral epidural space and right lateral recess, partially surrounding the descending right S1 nerve root. Overall appearance is stable without evidence for recurrent disc herniation. No new spinal stenosis. Moderate to severe left greater than right neural foraminal narrowing, relatively unchanged from previous. IMPRESSION: 1. Postoperative changes from prior right hemi laminectomy with micro discectomy at L5-S1 without recurrent disc herniation. Moderate to severe bilateral L5 neural foraminal narrowing is relatively unchanged. 2. Left eccentric disc bulge with facet hypertrophy at L4-5 with resultant moderate left lateral recess narrowing, with moderate to severe left L4 foraminal  stenosis. 3. Similar mild bilateral L3 foraminal narrowing due to disc bulge and facet hypertrophy. Electronically Signed   By: Jeannine Boga M.D.   On: 07/14/2017 21:46    Assessment & Plan:   Ming was seen today for annual exam, anemia, hypertension, diabetes and hyperlipidemia.  Diagnoses and all orders for this visit:  Microalbuminuria due to type 2 diabetes mellitus ([place])- Will  attempt to get better control of his blood pressure. -     Urinalysis, Routine w reflex microscopic; Future -     Microalbumin / creatinine urine ratio; Future -     Azilsartan-Chlorthalidone (EDARBYCLOR) 40-12.5 MG TABS; Take 1 tablet by mouth daily.  Type 2 diabetes mellitus with complication, without long-term current use of insulin ([place])- His blood sugars are not quite adequately well controlled at 7.3%.  However, he tells me he is not willing to restart any of his oral meds. -     Hemoglobin A1c; Future -     Comprehensive metabolic panel; Future -     HM Diabetes Foot Exam  Essential hypertension-his blood pressure is not adequately well controlled.  Electrolytes are normal, renal function is stable.  I will upgrade him to a more potent ARB and will add a thiazide diuretic. -     TSH; Future -     Comprehensive metabolic panel; Future -     Azilsartan-Chlorthalidone (EDARBYCLOR) 40-12.5 MG TABS; Take 1 tablet by mouth daily.  Deficiency anemia- He has chronic rectal bleeding due to history of proctitis.  His H&H remain stable.  Evaluation for vitamin deficiencies was unremarkable.  This appears to be the anemia of chronic disease. -     CBC with Differential/Platelet; Future  Hyperlipidemia with target LDL less than 100- He has not achieved his LDL goal.  I have asked him to restart the statin. -     Lipid panel; Future -     TSH; Future -     atorvastatin (LIPITOR) 40 MG tablet; Take 1 tablet (40 mg total) by mouth daily.  Type 2 diabetes mellitus with complication, with long-term current  use of insulin ([facility]) -     Azilsartan-Chlorthalidone (EDARBYCLOR) 40-12.5 MG TABS; Take 1 tablet by mouth daily.  Memory difficulty -     Ambulatory referral to Neurology   I have discontinued Otila Back. Rosenbach's diltiazem, zolpidem, sertraline, Insulin Glargine, Insulin Lispro, canagliflozin, triamcinolone, JANUMET XR, traMADol, and telmisartan. I have also changed his atorvastatin. Additionally, I am having him maintain his omeprazole, tamsulosin, Needles & Syringes, gabapentin, aspirin EC, cholecalciferol, NOVOFINE PLUS, Insulin Pen Needle, ONE TOUCH ULTRA TEST, citalopram, diclofenac sodium, ferrous sulfate, clotrimazole-betamethasone, LANTUS SOLOSTAR, and Azilsartan-Chlorthalidone.  Meds ordered this encounter  Medications  . atorvastatin (LIPITOR) 40 MG tablet    Sig: Take 1 tablet (40 mg total) by mouth daily.    Dispense:  90 tablet    Refill:  1  . Azilsartan-Chlorthalidone (EDARBYCLOR) 40-12.5 MG TABS    Sig: Take 1 tablet by mouth daily.    Dispense:  90 tablet    Refill:  1   See AVS for instructions about healthy living and anticipatory guidance.  Follow-up: Return in about 6 months (around 07/06/2018).  Scarlette Calico, MD

## 2018-01-03 NOTE — Patient Instructions (Signed)

## 2018-01-07 ENCOUNTER — Other Ambulatory Visit (HOSPITAL_COMMUNITY): Payer: Self-pay | Admitting: Urology

## 2018-01-07 ENCOUNTER — Ambulatory Visit (HOSPITAL_COMMUNITY)
Admission: RE | Admit: 2018-01-07 | Discharge: 2018-01-07 | Disposition: A | Discharge status: Home / Self Care | Payer: Medicare Other | Source: Ambulatory Visit | Attending: Urology | Admitting: Urology

## 2018-01-07 DIAGNOSIS — Z85528 Personal history of other malignant neoplasm of kidney: Secondary | ICD-10-CM | POA: Diagnosis not present

## 2018-01-07 DIAGNOSIS — K8020 Calculus of gallbladder without cholecystitis without obstruction: Secondary | ICD-10-CM | POA: Diagnosis not present

## 2018-01-07 NOTE — Assessment & Plan Note (Signed)

## 2018-02-09 ENCOUNTER — Other Ambulatory Visit: Payer: Self-pay | Admitting: Internal Medicine

## 2018-02-18 ENCOUNTER — Other Ambulatory Visit: Payer: Self-pay | Admitting: Internal Medicine

## 2018-02-18 DIAGNOSIS — E1129 Type 2 diabetes mellitus with other diabetic kidney complication: Secondary | ICD-10-CM

## 2018-02-18 DIAGNOSIS — Z794 Long term (current) use of insulin: Secondary | ICD-10-CM

## 2018-02-18 DIAGNOSIS — E118 Type 2 diabetes mellitus with unspecified complications: Secondary | ICD-10-CM

## 2018-02-18 DIAGNOSIS — R809 Proteinuria, unspecified: Principal | ICD-10-CM

## 2018-02-18 DIAGNOSIS — I10 Essential (primary) hypertension: Secondary | ICD-10-CM

## 2018-03-02 DIAGNOSIS — R42 Dizziness and giddiness: Secondary | ICD-10-CM | POA: Diagnosis not present

## 2018-03-02 DIAGNOSIS — I10 Essential (primary) hypertension: Secondary | ICD-10-CM | POA: Diagnosis not present

## 2018-03-04 ENCOUNTER — Ambulatory Visit (INDEPENDENT_AMBULATORY_CARE_PROVIDER_SITE_OTHER): Payer: Medicare Other | Admitting: Internal Medicine

## 2018-03-04 ENCOUNTER — Encounter: Payer: Self-pay | Admitting: Internal Medicine

## 2018-03-04 ENCOUNTER — Other Ambulatory Visit (INDEPENDENT_AMBULATORY_CARE_PROVIDER_SITE_OTHER): Payer: Medicare Other

## 2018-03-04 VITALS — BP 120/64 | HR 64 | Temp 97.9°F | Resp 16 | Ht 71.0 in | Wt 215.0 lb

## 2018-03-04 DIAGNOSIS — D539 Nutritional anemia, unspecified: Secondary | ICD-10-CM

## 2018-03-04 DIAGNOSIS — Z23 Encounter for immunization: Secondary | ICD-10-CM

## 2018-03-04 DIAGNOSIS — E118 Type 2 diabetes mellitus with unspecified complications: Secondary | ICD-10-CM

## 2018-03-04 DIAGNOSIS — I10 Essential (primary) hypertension: Secondary | ICD-10-CM | POA: Diagnosis not present

## 2018-03-04 DIAGNOSIS — H6123 Impacted cerumen, bilateral: Secondary | ICD-10-CM | POA: Diagnosis not present

## 2018-03-04 LAB — CBC WITH DIFFERENTIAL/PLATELET
Basophils Absolute: 0 10*3/uL (ref 0.0–0.1)
Basophils Relative: 1.2 % (ref 0.0–3.0)
Eosinophils Absolute: 0.1 10*3/uL (ref 0.0–0.7)
Eosinophils Relative: 2.4 % (ref 0.0–5.0)
HEMATOCRIT: 31.8 % — AB (ref 39.0–52.0)
HEMOGLOBIN: 11.2 g/dL — AB (ref 13.0–17.0)
LYMPHS PCT: 25 % (ref 12.0–46.0)
Lymphs Abs: 0.8 10*3/uL (ref 0.7–4.0)
MCHC: 35.1 g/dL (ref 30.0–36.0)
MCV: 87.9 fl (ref 78.0–100.0)
MONO ABS: 0.4 10*3/uL (ref 0.1–1.0)
MONOS PCT: 13 % — AB (ref 3.0–12.0)
Neutro Abs: 1.9 10*3/uL (ref 1.4–7.7)
Neutrophils Relative %: 58.4 % (ref 43.0–77.0)
Platelets: 199 10*3/uL (ref 150.0–400.0)
RBC: 3.62 Mil/uL — AB (ref 4.22–5.81)
RDW: 15.2 % (ref 11.5–15.5)
WBC: 3.3 10*3/uL — AB (ref 4.0–10.5)

## 2018-03-04 LAB — BASIC METABOLIC PANEL
BUN: 33 mg/dL — ABNORMAL HIGH (ref 6–23)
CO2: 23 mEq/L (ref 19–32)
CREATININE: 1.89 mg/dL — AB (ref 0.40–1.50)
Calcium: 9.8 mg/dL (ref 8.4–10.5)
Chloride: 108 mEq/L (ref 96–112)
GFR: 45.33 mL/min — ABNORMAL LOW (ref >60.00)
Glucose, Bld: 115 mg/dL — ABNORMAL HIGH (ref 70–99)
Potassium: 4.3 mEq/L (ref 3.5–5.1)
SODIUM: 139 meq/L (ref 135–145)

## 2018-03-04 LAB — FOLATE: Folate: >23.9 ng/mL (ref >5.9)

## 2018-03-04 LAB — IBC PANEL
IRON: 57 ug/dL (ref 42–165)
Saturation Ratios: 14.6 % — ABNORMAL LOW (ref 20.0–50.0)
Transferrin: 278 mg/dL (ref 212.0–360.0)

## 2018-03-04 LAB — FERRITIN: Ferritin: 66.8 ng/mL (ref 22.0–322.0)

## 2018-03-04 LAB — VITAMIN B12: VITAMIN B 12: 1335 pg/mL — AB (ref 211–911)

## 2018-03-04 NOTE — Assessment & Plan Note (Signed)
Symptoms resolved after the EACs were irrigated.

## 2018-03-04 NOTE — Patient Instructions (Signed)
Anemia Anemia is a condition in which you do not have enough red blood cells or hemoglobin. Hemoglobin is a substance in red blood cells that carries oxygen. When you do not have enough red blood cells or hemoglobin (are anemic), your body cannot get enough oxygen and your organs may not work properly. As a result, you may feel very tired or have other problems. What are the causes? Common causes of anemia include:  Excessive bleeding. Anemia can be caused by excessive bleeding inside or outside the body, including bleeding from the intestine or from periods in women.  Poor nutrition.  Long-lasting (chronic) kidney, thyroid, and liver disease.  Bone marrow disorders.  Cancer and treatments for cancer.  HIV (human immunodeficiency virus) and AIDS (acquired immunodeficiency syndrome).  Treatments for HIV and AIDS.  Spleen problems.  Blood disorders.  Infections, medicines, and autoimmune disorders that destroy red blood cells.  What are the signs or symptoms? Symptoms of this condition include:  Minor weakness.  Dizziness.  Headache.  Feeling heartbeats that are irregular or faster than normal (palpitations).  Shortness of breath, especially with exercise.  Paleness.  Cold sensitivity.  Indigestion.  Nausea.  Difficulty sleeping.  Difficulty concentrating.  Symptoms may occur suddenly or develop slowly. If your anemia is mild, you may not have symptoms. How is this diagnosed? This condition is diagnosed based on:  Blood tests.  Your medical history.  A physical exam.  Bone marrow biopsy.  Your health care provider may also check your stool (feces) for blood and may do additional testing to look for the cause of your bleeding. You may also have other tests, including:  Imaging tests, such as a CT scan or MRI.  Endoscopy.  Colonoscopy.  How is this treated? Treatment for this condition depends on the cause. If you continue to lose a lot of blood,  you may need to be treated at a hospital. Treatment may include:  Taking supplements of iron, vitamin B12, or folic acid.  Taking a hormone medicine (erythropoietin) that can help to stimulate red blood cell growth.  Having a blood transfusion. This may be needed if you lose a lot of blood.  Making changes to your diet.  Having surgery to remove your spleen.  Follow these instructions at home:  Take over-the-counter and prescription medicines only as told by your health care provider.  Take supplements only as told by your health care provider.  Follow any diet instructions that you were given.  Keep all follow-up visits as told by your health care provider. This is important. Contact a health care provider if:  You develop new bleeding anywhere in the body. Get help right away if:  You are very weak.  You are short of breath.  You have pain in your abdomen or chest.  You are dizzy or feel faint.  You have trouble concentrating.  You have bloody or black, tarry stools.  You vomit repeatedly or you vomit up blood. Summary  Anemia is a condition in which you do not have enough red blood cells or enough of a substance in your red blood cells that carries oxygen (hemoglobin).  Symptoms may occur suddenly or develop slowly.  If your anemia is mild, you may not have symptoms.  This condition is diagnosed with blood tests as well as a medical history and physical exam. Other tests may be needed.  Treatment for this condition depends on the cause of the anemia. This information is not intended to replace advice   given to you by your health care provider. Make sure you discuss any questions you have with your health care provider. Document Released: 06/08/2004 Document Revised: 06/02/2016 Document Reviewed: 06/02/2016 Elsevier Interactive Patient Education  [employer].

## 2018-03-04 NOTE — Progress Notes (Signed)
Subjective:  Patient ID: Antonio Adams, male    DOB: 1945-10-31  Age: 72 y.o. MRN: [id number]  CC: Hypertension and Anemia   HPI [name] presents for f/up - He complains of a one-week history of dizziness, vertigo and decreased hearing in both ears.  He denies headache, blurred vision, paresthesias, nausea, or vomiting.  Outpatient Medications Prior to Visit  Medication Sig Dispense Refill  . aspirin EC 81 MG tablet Take 81 mg by mouth daily.    Marland Kitchen atorvastatin (LIPITOR) 40 MG tablet Take 1 tablet (40 mg total) by mouth daily. 90 tablet 1  . Azilsartan-Chlorthalidone (EDARBYCLOR) 40-12.5 MG TABS Take 1 tablet by mouth daily. 90 tablet 1  . cholecalciferol (VITAMIN D) 1000 units tablet Take 1,000 Units by mouth daily.    . citalopram (CELEXA) 40 MG tablet Take 1 tablet by mouth daily.    . clotrimazole-betamethasone (LOTRISONE) cream APPLY 1 APPLICATION TOPICALLY 2 (TWO) TIMES DAILY. 30 g 0  . diclofenac sodium (VOLTAREN) 1 % GEL Apply 2 g topically 4 (four) times daily. 2 Tube 0  . ferrous sulfate 325 (65 FE) MG tablet TAKE 1 TABLET (325 MG TOTAL) BY MOUTH 2 (TWO) TIMES DAILY. 60 tablet 6  . gabapentin (NEURONTIN) 300 MG capsule Take 300 mg by mouth at bedtime.    . Insulin Pen Needle (NOVOFINE) 32G X 6 MM MISC 1 Act by Does not apply route daily. USE TID 100 each 11  . JANUMET XR 50-1000 MG TB24 TAKE 1 TABLET BY MOUTH EVERY DAY 90 tablet 1  . LANTUS SOLOSTAR 100 UNIT/ML Solostar Pen INJECT 50 UNITS INTO THE SKIN EVERY MORNING. 3 mL 1  . Needles & Syringes MISC Use syringe and needle to inject insulin into the skin as directed on insulin prescription. DX E11.09 90 each 3  . NOVOFINE PLUS 32G X 4 MM MISC USE DAILY WITH INSULIN 100 each 2  . omeprazole (PRILOSEC) 40 MG capsule TAKE ONE CAPSULE EVERY DAY 30 capsule 10  . ONE TOUCH ULTRA TEST test strip USE 2 (TWO) TIMES DAILY. AND LANCETS 2/DAY 250.03 100 each 11  . tamsulosin (FLOMAX) 0.4 MG CAPS capsule TAKE 1 CAPSULE  (0.4 MG TOTAL) BY MOUTH DAILY. 30 capsule 1  . telmisartan (MICARDIS) 40 MG tablet TAKE 1 TABLET (40 MG TOTAL) BY MOUTH DAILY. 90 tablet 0   No facility-administered medications prior to visit.     ROS Review of Systems  Constitutional: Negative for appetite change, diaphoresis, fatigue and fever.  HENT: Positive for hearing loss. Negative for facial swelling, sinus pressure, sore throat and trouble swallowing.   Cardiovascular: Negative for chest pain, palpitations and leg swelling.  Gastrointestinal: Negative.  Negative for abdominal pain, constipation, diarrhea, nausea and vomiting.  Endocrine: Negative.   Genitourinary: Negative.  Negative for decreased urine volume, difficulty urinating, dysuria and urgency.  Musculoskeletal: Negative.  Negative for arthralgias and myalgias.  Skin: Negative.  Negative for wound.  Allergic/Immunologic: Negative.   Neurological: Positive for dizziness. Negative for tremors, facial asymmetry, weakness, light-headedness and numbness.  Hematological: Negative for adenopathy. Does not bruise/bleed easily.  Psychiatric/Behavioral: Negative.     Objective:  BP 120/64 (BP Location: Left Arm, Patient Position: Sitting, Cuff Size: Large)   Pulse 64   Temp 97.9 F (36.6 C) (Oral)   Resp 16   Ht 5\' 11"  (1.803 m)   Wt 215 lb (97.5 kg)   SpO2 97%   BMI 29.99 kg/m   BP Readings from Last 3 Encounters:  03/04/18 120/64  01/03/18 (!) 144/90  10/18/17 (!) 143/84    Wt Readings from Last 3 Encounters:  03/04/18 215 lb (97.5 kg)  01/03/18 216 lb (98 kg)  10/18/17 224 lb (101.6 kg)    Physical Exam  Constitutional: No distress.  HENT:  Right Ear: Hearing, tympanic membrane and external ear normal. A foreign body (cerumen impaction) is present. Tympanic membrane is not injected, not scarred, not perforated and not erythematous.  Left Ear: Hearing, tympanic membrane and external ear normal. A foreign body (cerumen impaction) is present. Tympanic  membrane is not injected, not scarred, not perforated and not erythematous.  Mouth/Throat: Oropharynx is clear and moist. No oropharyngeal exudate.  I put Colace in both ears and then I irrigated it with water and used an ear pick to remove the cerumen.  He tolerated this well.  The examination afterwards is normal.  He tells me that the symptoms of dizziness and hearing loss of have resolved  Eyes: Conjunctivae are normal. No scleral icterus.  Neck: Normal range of motion. Neck supple. No JVD present. No thyromegaly present.  Cardiovascular: Normal rate and regular rhythm. Exam reveals no gallop.  No murmur heard. Pulmonary/Chest: Effort normal and breath sounds normal. No respiratory distress. He has no wheezes. He has no rales.  Abdominal: Bowel sounds are normal. He exhibits no mass. There is no tenderness.  Lymphadenopathy:    He has no cervical adenopathy.  Skin: He is not diaphoretic.  Vitals reviewed.   Lab Results  Component Value Date   WBC 3.3 (L) 03/04/2018   HGB 11.2 (L) 03/04/2018   HCT 31.8 (L) 03/04/2018   PLT 199.0 03/04/2018   GLUCOSE 115 (H) 03/04/2018   CHOL 167 01/03/2018   TRIG 89.0 01/03/2018   HDL 36.70 (L) 01/03/2018   LDLCALC 113 (H) 01/03/2018   ALT 17 01/03/2018   AST 13 01/03/2018   NA 139 03/04/2018   K 4.3 03/04/2018   CL 108 03/04/2018   CREATININE 1.89 (H) 03/04/2018   BUN 33 (H) 03/04/2018   CO2 23 03/04/2018   TSH 1.38 01/03/2018   PSA 0.046 08/25/2016   INR 1.02 07/14/2010   HGBA1C 7.3 (H) 01/03/2018   MICROALBUR 28.5 (H) 01/03/2018    Dg Chest 2 View  Result Date: 01/07/2018 CLINICAL DATA:  72 year old male with history of treated renal cell carcinoma in 2017. Subsequent encounter. EXAM: CHEST - 2 VIEW COMPARISON:  CT Abdomen and Pelvis today reported separately. Chest radiographs 03/28/2016 and earlier. FINDINGS: Stable lung volumes and mediastinal contours. Cardiac size at the upper limits of normal to mildly increased. Mildly tortuous  thoracic aorta. Visualized tracheal air column is within normal limits. Lung parenchyma appears stable and clear. No pneumothorax or pleural effusion. No pulmonary nodule. Stable visualized osseous structures. Negative visible bowel gas pattern. IMPRESSION: No acute or metastatic process identified radiographically. Electronically Signed   By: [name] M.D.   On: 01/07/2018 15:17    Assessment & Plan:   Gurpreet was seen today for hypertension and anemia.  Diagnoses and all orders for this visit:  Need for influenza vaccination -     Flu vaccine HIGH DOSE PF (Fluzone High dose)  Deficiency anemia- His H&H are slightly lower than they were before.  I will monitor him for vitamin deficiencies.  This is most likely the anemia of chronic disease. -     CBC with Differential/Platelet; Future -     Vitamin B12; Future -     IBC panel;  Future -     Ferritin; Future -     Folate; Future -     Vitamin B1; Future  Type II diabetes mellitus with manifestations ([place])- His recent A1c was 7.3%.  His blood sugars have been adequately well controlled. -     Basic metabolic panel; Future  Essential hypertension- His blood pressure is adequately well controlled.   I am having Otila Back. Johnnye Sima maintain his omeprazole, tamsulosin, Needles & Syringes, gabapentin, aspirin EC, cholecalciferol, NOVOFINE PLUS, Insulin Pen Needle, ONE TOUCH ULTRA TEST, citalopram, diclofenac sodium, ferrous sulfate, clotrimazole-betamethasone, LANTUS SOLOSTAR, atorvastatin, Azilsartan-Chlorthalidone, JANUMET XR, and telmisartan.  No orders of the defined types were placed in this encounter.    Follow-up: Return in about 4 months (around 07/05/2018).  Scarlette Calico, MD

## 2018-03-08 LAB — VITAMIN B1: Vitamin B1 (Thiamine): 14 nmol/L (ref 8–30)

## 2018-04-03 ENCOUNTER — Ambulatory Visit: Payer: Medicare Other

## 2018-04-04 ENCOUNTER — Encounter: Payer: Self-pay | Admitting: Internal Medicine

## 2018-04-04 ENCOUNTER — Ambulatory Visit (INDEPENDENT_AMBULATORY_CARE_PROVIDER_SITE_OTHER): Payer: Medicare Other | Admitting: Internal Medicine

## 2018-04-04 VITALS — BP 146/70 | HR 81 | Temp 98.1°F | Resp 16 | Ht 71.0 in | Wt 223.0 lb

## 2018-04-04 DIAGNOSIS — E118 Type 2 diabetes mellitus with unspecified complications: Secondary | ICD-10-CM

## 2018-04-04 DIAGNOSIS — N3281 Overactive bladder: Secondary | ICD-10-CM | POA: Diagnosis not present

## 2018-04-04 LAB — HEMOGLOBIN A1C: Hgb A1c MFr Bld: 6.4 — AB (ref 4.0–6.0)

## 2018-04-04 MED ORDER — MIRABEGRON ER 25 MG PO TB24: 25.0000 mg | ORAL_TABLET | Freq: Every day | ORAL | 1 refills | Status: DC

## 2018-04-04 NOTE — Progress Notes (Signed)
Subjective:  Patient ID: Antonio Adams, male    DOB: [birth year]  Age: 72 y.o. MRN: [id number]  CC: Diabetes   HPI Antonio Adams presents for concerns about frequent urination.  He frequently urinates during the day and night.  He is followed closely by urology for prostate cancer.  His blood sugars has not been high recently.  Outpatient Medications Prior to Visit  Medication Sig Dispense Refill  . aspirin EC 81 MG tablet Take 81 mg by mouth daily.    Marland Kitchen atorvastatin (LIPITOR) 40 MG tablet Take 1 tablet (40 mg total) by mouth daily. 90 tablet 1  . Azilsartan-Chlorthalidone (EDARBYCLOR) 40-12.5 MG TABS Take 1 tablet by mouth daily. 90 tablet 1  . cholecalciferol (VITAMIN D) 1000 units tablet Take 1,000 Units by mouth daily.    . citalopram (CELEXA) 40 MG tablet Take 1 tablet by mouth daily.    . clotrimazole-betamethasone (LOTRISONE) cream APPLY 1 APPLICATION TOPICALLY 2 (TWO) TIMES DAILY. 30 g 0  . diclofenac sodium (VOLTAREN) 1 % GEL Apply 2 g topically 4 (four) times daily. 2 Tube 0  . ferrous sulfate 325 (65 FE) MG tablet TAKE 1 TABLET (325 MG TOTAL) BY MOUTH 2 (TWO) TIMES DAILY. 60 tablet 6  . gabapentin (NEURONTIN) 300 MG capsule Take 300 mg by mouth at bedtime.    . Insulin Pen Needle (NOVOFINE) 32G X 6 MM MISC 1 Act by Does not apply route daily. USE TID 100 each 11  . JANUMET XR 50-1000 MG TB24 TAKE 1 TABLET BY MOUTH EVERY DAY 90 tablet 1  . LANTUS SOLOSTAR 100 UNIT/ML Solostar Pen INJECT 50 UNITS INTO THE SKIN EVERY MORNING. 3 mL 1  . Needles & Syringes MISC Use syringe and needle to inject insulin into the skin as directed on insulin prescription. DX E11.09 90 each 3  . NOVOFINE PLUS 32G X 4 MM MISC USE DAILY WITH INSULIN 100 each 2  . omeprazole (PRILOSEC) 40 MG capsule TAKE ONE CAPSULE EVERY DAY 30 capsule 10  . ONE TOUCH ULTRA TEST test strip USE 2 (TWO) TIMES DAILY. AND LANCETS 2/DAY 250.03 100 each 11  . tamsulosin (FLOMAX) 0.4 MG CAPS capsule TAKE 1 CAPSULE  (0.4 MG TOTAL) BY MOUTH DAILY. 30 capsule 1  . telmisartan (MICARDIS) 40 MG tablet TAKE 1 TABLET (40 MG TOTAL) BY MOUTH DAILY. 90 tablet 0   No facility-administered medications prior to visit.     ROS Review of Systems  Constitutional: Negative.  Negative for diaphoresis and fatigue.  HENT: Negative.   Eyes: Negative for visual disturbance.  Respiratory: Negative for cough, chest tightness, shortness of breath and wheezing.   Cardiovascular: Negative for chest pain, palpitations and leg swelling.  Gastrointestinal: Negative for abdominal pain, diarrhea, nausea and vomiting.  Endocrine: Positive for polyphagia and polyuria. Negative for polydipsia.  Genitourinary: Positive for frequency. Negative for difficulty urinating, dysuria and urgency.       He denies urinary hesitancy, straining, or urgency.  Musculoskeletal: Negative.  Negative for arthralgias and myalgias.  Skin: Negative.   Neurological: Negative for dizziness, weakness, light-headedness and headaches.  Hematological: Negative for adenopathy. Does not bruise/bleed easily.  Psychiatric/Behavioral: Negative.     Objective:  BP (!) 146/70 (BP Location: Left Arm, Patient Position: Sitting, Cuff Size: Large)   Pulse 81   Temp 98.1 F (36.7 C) (Oral)   Resp 16   Ht 5\' 11"  (1.803 m)   Wt 223 lb (101.2 kg)   SpO2 94%   BMI  31.10 kg/m   BP Readings from Last 3 Encounters:  04/04/18 (!) 146/70  03/04/18 120/64  01/03/18 (!) 144/90    Wt Readings from Last 3 Encounters:  04/04/18 223 lb (101.2 kg)  03/04/18 215 lb (97.5 kg)  01/03/18 216 lb (98 kg)    Physical Exam  Constitutional: He is oriented to person, place, and time. No distress.  HENT:  Mouth/Throat: Oropharynx is clear and moist. No oropharyngeal exudate.  Eyes: Conjunctivae are normal. No scleral icterus.  Neck: Normal range of motion. Neck supple. No JVD present. No thyromegaly present.  Cardiovascular: Normal rate, regular rhythm and normal heart  sounds. Exam reveals no gallop.  No murmur heard. Pulmonary/Chest: Effort normal and breath sounds normal. No respiratory distress. He has no wheezes. He has no rhonchi. He has no rales.  Abdominal: Soft. Bowel sounds are normal. He exhibits no mass. There is no hepatosplenomegaly. There is no tenderness.  Musculoskeletal: Normal range of motion. He exhibits no edema, tenderness or deformity.  Lymphadenopathy:    He has no cervical adenopathy.  Neurological: He is alert and oriented to person, place, and time.  Skin: Skin is warm and dry. No rash noted. He is not diaphoretic.  Vitals reviewed.   Lab Results  Component Value Date   WBC 3.3 (L) 03/04/2018   HGB 11.2 (L) 03/04/2018   HCT 31.8 (L) 03/04/2018   PLT 199.0 03/04/2018   GLUCOSE 115 (H) 03/04/2018   CHOL 167 01/03/2018   TRIG 89.0 01/03/2018   HDL 36.70 (L) 01/03/2018   LDLCALC 113 (H) 01/03/2018   ALT 17 01/03/2018   AST 13 01/03/2018   NA 139 03/04/2018   K 4.3 03/04/2018   CL 108 03/04/2018   CREATININE 1.89 (H) 03/04/2018   BUN 33 (H) 03/04/2018   CO2 23 03/04/2018   TSH 1.38 01/03/2018   PSA 0.046 08/25/2016   INR 1.02 07/14/2010   HGBA1C 6.4 (A) 04/04/2018   MICROALBUR 28.5 (H) 01/03/2018    Dg Chest 2 View  Result Date: 01/07/2018 CLINICAL DATA:  72 year old male with history of treated renal cell carcinoma in 2017. Subsequent encounter. EXAM: CHEST - 2 VIEW COMPARISON:  CT Abdomen and Pelvis today reported separately. Chest radiographs 03/28/2016 and earlier. FINDINGS: Stable lung volumes and mediastinal contours. Cardiac size at the upper limits of normal to mildly increased. Mildly tortuous thoracic aorta. Visualized tracheal air column is within normal limits. Lung parenchyma appears stable and clear. No pneumothorax or pleural effusion. No pulmonary nodule. Stable visualized osseous structures. Negative visible bowel gas pattern. IMPRESSION: No acute or metastatic process identified radiographically.  Electronically Signed   By: [name] M.D.   On: 01/07/2018 15:17    Assessment & Plan:   Langford was seen today for diabetes.  Diagnoses and all orders for this visit:  Type II diabetes mellitus with manifestations ([place])- His A1c is at 6.4%.  His blood sugars are adequately well controlled.  I do not think hyperglycemia is causing his symptoms.  OAB (overactive bladder)- His recent electrolytes were normal.  He has no symptoms of DI or dehydration.  I think his frequent urination is caused by OAB so I have asked him to try Myrbetriq.  We can increase the dose to 50 mg a day if indicated. -     mirabegron ER (MYRBETRIQ) 25 MG TB24 tablet; Take 1 tablet (25 mg total) by mouth daily.   I am having Otila Back. Johnnye Sima start on mirabegron ER. I am also having  him maintain his omeprazole, tamsulosin, Needles & Syringes, gabapentin, aspirin EC, cholecalciferol, NOVOFINE PLUS, Insulin Pen Needle, ONE TOUCH ULTRA TEST, citalopram, diclofenac sodium, ferrous sulfate, clotrimazole-betamethasone, LANTUS SOLOSTAR, atorvastatin, Azilsartan-Chlorthalidone, JANUMET XR, and telmisartan.  Meds ordered this encounter  Medications  . mirabegron ER (MYRBETRIQ) 25 MG TB24 tablet    Sig: Take 1 tablet (25 mg total) by mouth daily.    Dispense:  90 tablet    Refill:  1     Follow-up: Return in about 4 months (around 08/03/2018).  Scarlette Calico, MD

## 2018-04-04 NOTE — Patient Instructions (Signed)
Overactive Bladder, Adult  Overactive bladder is a group of urinary symptoms. With overactive bladder, you may suddenly feel the need to pass urine (urinate) right away. After feeling this sudden urge, you might also leak urine if you cannot get to the bathroom fast enough (urinary incontinence). These symptoms might interfere with your daily work or social activities. Overactive bladder symptoms may also wake you up at night.  Overactive bladder affects the nerve signals between your bladder and your brain. Your bladder may get the signal to empty before it is full. Very sensitive muscles can also make your bladder squeeze too soon.  What are the causes?  Many things can cause an overactive bladder. Possible causes include:   Urinary tract infection.   Infection of nearby tissues, such as the prostate.   Prostate enlargement.   Being pregnant with twins or more (multiples).   Surgery on the uterus or urethra.   Bladder stones, inflammation, or tumors.   Drinking too much caffeine or alcohol.   Certain medicines, especially those that you take to help your body get rid of extra fluid (diuretics) by increasing urine production.   Muscle or nerve weakness, especially from:  ? A spinal cord injury.  ? Stroke.  ? Multiple sclerosis.  ? Parkinson disease.   Diabetes. This can cause a high urine volume that fills the bladder so quickly that the normal urge to urinate is triggered very strongly.   Constipation. A buildup of too much stool can put pressure on your bladder.    What increases the risk?  You may be at greater risk for overactive bladder if you:   Are an older adult.   Smoke.   Are going through menopause.   Have prostate problems.   Have a neurological disease, such as stroke, dementia, Parkinson disease, or multiple sclerosis (MS).   Eat or drink things that irritate the bladder. These include alcohol, spicy food, and caffeine.   Are overweight or obese.    What are the signs or  symptoms?  The signs and symptoms of an overactive bladder include:   Sudden, strong urges to urinate.   Leaking urine.   Urinating eight or more times per day.   Waking up to urinate two or more times per night.    How is this diagnosed?  Your health care provider may suspect overactive bladder based on your symptoms. The health care provider will do a physical exam and take your medical history. Blood or urine tests may also be done. For example, you might need to have a bladder function test to check how well you can hold your urine. You might also need to see a health care provider who specializes in the urinary tract (urologist).  How is this treated?  Treatment for overactive bladder depends on the cause of your condition and whether it is mild or severe. Certain treatments can be done in your health care provider's office or clinic. You can also make lifestyle changes at home. Options include:  Behavioral Treatments   Biofeedback. A specialist uses sensors to help you become aware of your body's signals.   Keeping a daily log of when you need to urinate and what happens after the urge. This may help you manage your condition.   Bladder training. This helps you learn to control the urge to urinate by following a schedule that directs you to urinate at regular intervals (timed voiding). At first, you might have to wait a few minutes after   feeling the urge. In time, you should be able to schedule bathroom visits an hour or more apart.   Kegel exercises. These are exercises to strengthen the pelvic floor muscles, which support the bladder. Toning these muscles can help you control urination, even if your bladder muscles are overactive. A specialist will teach you how to do these exercises correctly. They require daily practice.   Weight loss. If you are obese or overweight, losing weight might relieve your symptoms of overactive bladder. Talk to your health care provider about losing weight and whether  there is a specific program or method that would work best for you.   Diet change. This might help if constipation is making your overactive bladder worse. Your health care provider or a dietitian can explain ways to change what you eat to ease constipation. You might also need to consume less alcohol and caffeine or drink other fluids at different times of the day.   Stopping smoking.   Wearing pads to absorb leakage while you wait for other treatments to take effect.  Physical Treatments   Electrical stimulation. Electrodes send gentle pulses of electricity to strengthen the nerves or muscles that help to control the bladder. Sometimes, the electrodes are placed outside of the body. In other cases, they might be placed inside the body (implanted). This treatment can take several months to have an effect.   Supportive devices. Women may need a plastic device that fits into the vagina and supports the bladder (pessary).  Medicines  Several medicines can help treat overactive bladder and are usually used along with other treatments. Some are injected into the muscles involved in urination. Others come in pill form. Your health care provider may prescribe:   Antispasmodics. These medicines block the signals that the nerves send to the bladder. This keeps the bladder from releasing urine at the wrong time.   Tricyclic antidepressants. These types of antidepressants also relax bladder muscles.    Surgery   You may have a device implanted to help manage the nerve signals that indicate when you need to urinate.   You may have surgery to implant electrodes for electrical stimulation.   Sometimes, very severe cases of overactive bladder require surgery to change the shape of the bladder.  Follow these instructions at home:   Take medicines only as directed by your health care provider.   Use any implants or a pessary as directed by your health care provider.   Make any diet or lifestyle changes that are  recommended by your health care provider. These might include:  ? Drinking less fluid or drinking at different times of the day. If you need to urinate often during the night, you may need to stop drinking fluids early in the evening.  ? Cutting down on caffeine or alcohol. Both can make an overactive bladder worse. Caffeine is found in coffee, tea, and sodas.  ? Doing Kegel exercises to strengthen muscles.  ? Losing weight if you need to.  ? Eating a healthy and balanced diet to prevent constipation.   Keep a journal or log to track how much and when you drink and also when you feel the need to urinate. This will help your health care provider to monitor your condition.  Contact a health care provider if:   Your symptoms do not get better after treatment.   Your pain and discomfort are getting worse.   You have more frequent urges to urinate.   You have a fever.    Get help right away if:  You are not able to control your bladder at all.  This information is not intended to replace advice given to you by your health care provider. Make sure you discuss any questions you have with your health care provider.  Document Released: 02/25/2009 Document Revised: 10/07/2015 Document Reviewed: 09/24/2013  Elsevier Interactive Patient Education  2018 [employer]

## 2018-04-16 ENCOUNTER — Other Ambulatory Visit: Payer: Self-pay

## 2018-04-16 NOTE — Patient Outreach (Signed)
[facility]) Care Management  04/16/2018  KAYCE BETTY [birth year] [id number]   Medication Adherence call to Mr. Yousef Huge spoke with patient he is due on Telmisartan 40 mg and Atorvastatin 40 mg patient is no longer taking Telmisartan 40 mg and on Atorvastatin he is taking 1 tablet every other days instead of 1 tablet daily patient has plenty of medications at this time. Mr. Reish is showing past due under [place].    [facility] Direct Dial [phone]  Fax [phone] [email or web address]

## 2018-05-29 ENCOUNTER — Other Ambulatory Visit: Payer: Self-pay

## 2018-05-29 ENCOUNTER — Encounter: Payer: Self-pay | Admitting: Neurology

## 2018-05-29 ENCOUNTER — Ambulatory Visit: Payer: Medicare Other | Admitting: Neurology

## 2018-05-29 VITALS — BP 152/80 | HR 71 | Ht 71.0 in | Wt 221.0 lb

## 2018-05-29 DIAGNOSIS — G3184 Mild cognitive impairment, so stated: Secondary | ICD-10-CM | POA: Diagnosis not present
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
## 2018-05-29 NOTE — Patient Instructions (Signed)
Great seeing you! Follow-up in 6 months, call for any changes.   RECOMMENDATIONS FOR ALL PATIENTS WITH MEMORY PROBLEMS: 1. Continue to exercise (Recommend 30 minutes of walking everyday, or 3 hours every week) 2. Increase social interactions - continue going to [place] and enjoy social gatherings with friends and family 3. Eat healthy, avoid fried foods and eat more fruits and vegetables 4. Maintain adequate blood pressure, blood sugar, and blood cholesterol level. Reducing the risk of stroke and cardiovascular disease also helps promoting better memory. 5. Avoid stressful situations. Live a simple life and avoid aggravations. Organize your time and prepare for the next day in anticipation. 6. Sleep well, avoid any interruptions of sleep and avoid any distractions in the bedroom that may interfere with adequate sleep quality 7. Avoid sugar, avoid sweets as there is a strong link between excessive sugar intake, diabetes, and cognitive impairment The Mediterranean diet has been shown to help patients reduce the risk of progressive memory disorders and reduces cardiovascular risk. This includes eating fish, eat fruits and green leafy vegetables, nuts like almonds and hazelnuts, walnuts, and also use olive oil. Avoid fast foods and fried foods as much as possible. Avoid sweets and sugar as sugar use has been linked to worsening of memory function.

## 2018-07-09 ENCOUNTER — Other Ambulatory Visit: Payer: Self-pay

## 2018-07-09 DIAGNOSIS — D508 Other iron deficiency anemias: Secondary | ICD-10-CM

## 2018-07-09 MED ORDER — FERROUS SULFATE 325 (65 FE) MG PO TABS: 325.0000 mg | ORAL_TABLET | Freq: Two times a day (BID) | ORAL | 6 refills | Status: DC

## 2018-07-18 ENCOUNTER — Ambulatory Visit (INDEPENDENT_AMBULATORY_CARE_PROVIDER_SITE_OTHER): Payer: Medicare Other | Admitting: Surgery

## 2018-07-18 ENCOUNTER — Ambulatory Visit (INDEPENDENT_AMBULATORY_CARE_PROVIDER_SITE_OTHER): Payer: Self-pay

## 2018-07-18 ENCOUNTER — Encounter (INDEPENDENT_AMBULATORY_CARE_PROVIDER_SITE_OTHER): Payer: Self-pay | Admitting: Surgery

## 2018-07-18 DIAGNOSIS — M25561 Pain in right knee: Secondary | ICD-10-CM | POA: Diagnosis not present

## 2018-07-18 DIAGNOSIS — M1711 Unilateral primary osteoarthritis, right knee: Secondary | ICD-10-CM | POA: Diagnosis not present

## 2018-07-18 DIAGNOSIS — G8929 Other chronic pain: Secondary | ICD-10-CM

## 2018-07-18 MED ORDER — BUPIVACAINE HCL 0.25 % IJ SOLN
6.0000 mL | INTRAMUSCULAR | Status: AC | PRN
  Administered 2018-07-18: 6 mL via INTRA_ARTICULAR

## 2018-07-18 MED ORDER — METHYLPREDNISOLONE ACETATE 40 MG/ML IJ SUSP
40.0000 mg | INTRAMUSCULAR | Status: AC | PRN
  Administered 2018-07-18: 40 mg via INTRA_ARTICULAR

## 2018-07-18 MED ORDER — LIDOCAINE HCL 1 % IJ SOLN
3.0000 mL | INTRAMUSCULAR | Status: AC | PRN
  Administered 2018-07-18: 3 mL

## 2018-07-18 NOTE — Progress Notes (Signed)
Office Visit Note   Patient: Antonio Adams           Date of Birth: 18-Jul-1945           MRN: [id number] Visit Date: 07/18/2018              Requested by: [name], MD [address] [place], [place] [address] PCP: [name], MD   Assessment & Plan: Visit Diagnoses:  1. Chronic pain of right knee   2. Arthritis of right knee     Plan: In hopes of giving patient some relief of.  Patient sent right knee was prepped with Betadine and intra-articular Marcaine/Depo-Medrol injection was performed.  Tolerated procedure without complication.  After sitting for several minutes he did have good relief with Marcaine in place.  Advised him to use ice off and on as needed.  Follow-up in 4 weeks for recheck.  If he is doing better he may cancel that appointment.  If he continues to be symptomatic I may consider getting an MRI to better evaluate the extent of his degenerative changes and rule out medial meniscal tear.  With history of diabetes advised him to strictly monitor his blood sugars and adjust his insulin as needed.  Follow-Up Instructions: Return in about 4 weeks (around 08/15/2018) for with Massiah Longanecker.   Orders:  Orders Placed This Encounter  Procedures  . XR KNEE 3 VIEW RIGHT   No orders of the defined types were placed in this encounter.     Procedures: Large Joint Inj on 07/18/2018 3:18 PM Indications: pain and joint swelling Details: 25 G 1.5 in needle, anteromedial approach Medications: 3 mL lidocaine 1 %; 6 mL bupivacaine 0.25 %; 40 mg methylPREDNISolone acetate 40 MG/ML Outcome: tolerated well, no immediate complications  Good relief after sitting for a few minutes.  Consent was given by the patient. Patient was prepped and draped in the usual sterile fashion.       Clinical Data: No additional findings.   Subjective: Chief Complaint  Patient presents with  . Right Knee - Pain    HPI 73 year old white male history of right knee pain  comes in for evaluation.  Right knee pain x1 week.  No injury.  No problems before onset.  Denies previous history of gout.  No complaints of fever chills.  Pain when he is ambulating, going up and down stairs, squatting.  Not really describing much by the way of mechanical symptoms or instability. Review of Systems No current cardiac pulmonary GI GU issues  Objective: Vital Signs: There were no vitals taken for this visit.  Physical Exam HENT:     Head: Normocephalic and atraumatic.  Eyes:     Extraocular Movements: Extraocular movements intact.     Pupils: Pupils are equal, round, and reactive to light.  Pulmonary:     Effort: Pulmonary effort is normal.  Musculoskeletal:     Comments: Exam right knee.  No significant effusion.  Mild to moderately tender medial plica.  Positive patellofemoral crepitus.  Positive shoulder grind.  No signs of infection.  Ligaments are stable.  Calf nontender.  Neurological:     General: No focal deficit present.     Mental Status: He is alert and oriented to person, place, and time.  Psychiatric:        Mood and Affect: Mood normal.        Behavior: Behavior normal.     Ortho Exam  Specialty Comments:  No  specialty comments available.  Imaging: No results found.   PMFS History: Patient Active Problem List   Diagnosis Date Noted  . OAB (overactive bladder) 04/04/2018  . Hearing loss due to cerumen impaction, bilateral 05/21/2017  . Seasonal allergic rhinitis due to pollen 11/01/2016  . Microalbuminuria due to type 2 diabetes mellitus ([place]) 07/27/2016  . Renal cell carcinoma ([place]) 10/06/2015  . Varicose veins of both lower extremities 08/06/2015  . Varicose veins of lower extremities with ulcer ([place]) 06/16/2015  . Other constipation 08/03/2014  . Internal hemorrhoid, bleeding 08/03/2014  . Post traumatic stress disorder (PTSD) 01/29/2014  . Deficiency anemia 12/10/2013  . Radiation proctitis 06/04/2012  . Spinal stenosis of lumbar  region with radiculopathy 10/30/2011    Class: Chronic  . OSA (obstructive sleep apnea) 11/24/2010  . Memory difficulty 11/24/2010  . Obesity, Class I, BMI 30-34.9 11/24/2010  . Routine general medical examination at a health care facility 11/24/2010  . ADENOCARCINOMA, PROSTATE, GLEASON GRADE 6 12/21/2009  . Type II diabetes mellitus with manifestations ([place]) 03/16/2007  . Hyperlipidemia with target LDL less than 100 03/16/2007  . Essential hypertension 03/16/2007  . GERD 03/16/2007   Past Medical History:  Diagnosis Date  . ADENOCARCINOMA, PROSTATE, GLEASON GRADE 6 12/21/2009   prostate cancer  . ALLERGIC RHINITIS 03/16/2007  . Allergy   . Anxiety   . Arthritis    back   . BACK PAIN WITH RADICULOPATHY 11/23/2008  . Cancer of kidney ([place])    partial right kidney removed  . CARPAL TUNNEL SYNDROME, BILATERAL 03/16/2007  . Chronic back pain   . Constipation    takes Senokot daily  . Depression    occasionally  . DIABETES MELLITUS, TYPE II 03/16/2007   takes Januvia and MEtformin daily  . GASTROINTESTINAL HEMORRHAGE, HX OF 03/16/2007  . GERD 03/16/2007   takes Omeprazole daily  . Glaucoma    mild - no eye drops  . Hemorrhoids   . History of colon polyps   . HYPERLIPIDEMIA 03/16/2007   takes Crestor daily  . HYPERTENSION 03/16/2007   takes Diltiazem and Lisinopril daily   . LEG CRAMPS 05/22/2007  . Nocturia   . Overactive bladder   . PARESTHESIA 05/21/2007  . Proctitis   . Rectal bleeding    Dr.Norins has explained its from the Radiation that he has received  . Rectal bleeding   . Sleep apnea    uses CPAP nightly  . Stomach cancer ([place])   . Urinary frequency    takes Toviaz daily    Family History  Problem Relation Age of Onset  . Cancer Sister 30       Cancer, unsure type  . Hypertension Other   . Colon cancer Neg Hx   . Esophageal cancer Neg Hx   . Stomach cancer Neg Hx   . Diabetes Neg Hx   . Colon polyps Neg Hx   . Rectal cancer Neg Hx     Past Surgical History:    Procedure Laterality Date  . [place], 2008   repeat surgery; [facility] '08  . CARPAL TUNNEL RELEASE     bilateral  . CERVICAL FUSION    . COLONOSCOPY    . colonosocpy    . ESOPHAGOGASTRODUODENOSCOPY     ED  . FLEXIBLE SIGMOIDOSCOPY N/A 01/08/2017   Procedure: FLEXIBLE SIGMOIDOSCOPY;  Surgeon: Irene Shipper, MD;  Location[facility] ENDOSCOPY;  Service: Endoscopy;  Laterality: N/A;  . HOT HEMOSTASIS N/A 01/08/2017   Procedure: HOT HEMOSTASIS (  ARGON PLASMA COAGULATION/BICAP);  Surgeon: Irene Shipper, MD;  Location: [facility] ENDOSCOPY;  Service: Endoscopy;  Laterality: N/A;  . LUMBAR LAMINECTOMY  10/30/2011   Procedure: MICRODISCECTOMY LUMBAR LAMINECTOMY;  Surgeon: Jessy Oto, MD;  Location: [facility];  Service: Orthopedics;  Laterality: Right;  Right L4-5 and L5-S1 Microdiscectomy  . LUMBAR LAMINECTOMY  06/17/2012   Procedure: MICRODISCECTOMY LUMBAR LAMINECTOMY;  Surgeon: Jessy Oto, MD;  Location: [facility];  Service: Orthopedics;  Laterality: N/A;  Right L5-S1 microdiscectomy  . POLYPECTOMY    . PROSTATE SURGERY  march 2012   seed implant  . ROBOTIC ASSITED PARTIAL NEPHRECTOMY Right 09/08/2015   Procedure: XI ROBOTIC ASSITED RIGHT PARTIAL NEPHRECTOMY;  Surgeon: [name], MD;  Location: [facility];  Service: Urology;  Laterality: Right;  Clamp on: 1033 Clamp off: 1103 Total Clamp Time: 30 minutes  . SHOULDER ARTHROSCOPY WITH SUBACROMIAL DECOMPRESSION Left 01/20/2016   Procedure: LEFT SHOULDER ARTHROSCOPY WITH EXTENSIVE  DEBRIDEMENT, ACROMIOPLASTY;  Surgeon: Ninetta Lights, MD;  Location: [facility];  Service: Orthopedics;  Laterality: Left;  . SHOULDER SURGERY  LFT  . Stress Cardiolite  12/24/2003   negative for ischemia  . UPPER ESOPHAGEAL ENDOSCOPIC ULTRASOUND (EUS)  04/18/2016   [facility] hospital  . UPPER GASTROINTESTINAL ENDOSCOPY    . WOUND EXPLORATION     management of a wound that he sustained in the [place] History  . 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 Smoking status: Never Smoker  . Smokeless tobacco: Never Used  Substance and Sexual Activity  . Alcohol use: Yes    Alcohol/week: 0.0 standard drinks    Comment: occasional  . Drug use: No  . Sexual activity: Not Currently    Partners: Female

## 2018-07-20 ENCOUNTER — Other Ambulatory Visit: Payer: Self-pay | Admitting: Internal Medicine

## 2018-08-01 ENCOUNTER — Other Ambulatory Visit: Payer: Self-pay

## 2018-08-01 NOTE — Patient Outreach (Signed)
Antonio Adams) Care Management  08/01/2018  Antonio Adams Jun 20, 1945 [id number]   Medication Adherence call to [name] patient is due on Janumet rx 50/1000 he ask if we can call [facility] and order this medication [facility] did not  Answer but, left a voice message letting then know patient need a refill on Janumet. Mr. Amendola is showing past due under [facility] Adams ins.   [place] Management Direct Dial [phone]  Fax [phone] [email or web address]

## 2018-08-15 ENCOUNTER — Telehealth (INDEPENDENT_AMBULATORY_CARE_PROVIDER_SITE_OTHER): Payer: Self-pay

## 2018-08-15 ENCOUNTER — Ambulatory Visit (INDEPENDENT_AMBULATORY_CARE_PROVIDER_SITE_OTHER): Payer: Medicare Other | Admitting: Surgery

## 2018-08-15 NOTE — Telephone Encounter (Signed)
Called patient and asked the screening questions.  Do you have now or have you had in the past 7 days a fever and/or chills? NO  Do you have now or have you had in the past 7 days a cough? NO  Do you have now or have you had in the last 7 days nausea, vomiting or abdominal pain? NO  Have you been exposed to anyone who has tested positive for COVID-19? NO  Have you or anyone who lives with you traveled within the last month? NO

## 2018-08-16 ENCOUNTER — Ambulatory Visit (INDEPENDENT_AMBULATORY_CARE_PROVIDER_SITE_OTHER): Payer: Medicare Other | Admitting: Specialist

## 2018-08-19 ENCOUNTER — Ambulatory Visit (INDEPENDENT_AMBULATORY_CARE_PROVIDER_SITE_OTHER): Payer: Medicare Other | Admitting: Specialist

## 2018-08-19 ENCOUNTER — Encounter (INDEPENDENT_AMBULATORY_CARE_PROVIDER_SITE_OTHER): Payer: Self-pay | Admitting: Specialist

## 2018-08-19 ENCOUNTER — Other Ambulatory Visit: Payer: Self-pay

## 2018-08-19 VITALS — BP 136/77 | HR 80 | Ht 71.0 in | Wt 221.0 lb

## 2018-08-19 DIAGNOSIS — M1711 Unilateral primary osteoarthritis, right knee: Secondary | ICD-10-CM

## 2018-08-19 DIAGNOSIS — M5136 Other intervertebral disc degeneration, lumbar region: Secondary | ICD-10-CM | POA: Diagnosis not present

## 2018-08-19 MED ORDER — GABAPENTIN 300 MG PO CAPS: 300.0000 mg | ORAL_CAPSULE | Freq: Every day | ORAL | 3 refills | Status: DC

## 2018-08-19 MED ORDER — DICLOFENAC SODIUM 1 % TD GEL: 2.0000 g | Freq: Four times a day (QID) | TRANSDERMAL | 0 refills | Status: DC

## 2018-08-19 NOTE — Addendum Note (Signed)
Addended by: Basil Dess on: 08/19/2018 09:31 AM   Modules accepted: Orders

## 2018-08-19 NOTE — Progress Notes (Signed)
Office Visit Note   Patient: Antonio Adams           Date of Birth: [birth year]           MRN: [id number] Visit Date: 08/19/2018              Requested by: [name], MD [address], [place] [address] PCP: [name], MD   Assessment & Plan: Visit Diagnoses:  1. Unilateral primary osteoarthritis, right knee   2. Degenerative disc disease, lumbar     Plan: Avoid frequent bending and stooping  No lifting greater than 10 lbs. May use ice or moist heat for pain. Weight loss is of benefit. Do not take arthritis medications like motrin, celebrex and naprosyn due to history of kidney removal. Exercise is important to improve your indurance and does allow people to function better inspite of back pain. Call if you wish to consider a cortisone injection.   Follow-Up Instructions: No follow-ups on file.   Orders:  No orders of the defined types were placed in this encounter.  No orders of the defined types were placed in this encounter.     Procedures: No procedures performed   Clinical Data: No additional findings.   Subjective: Chief Complaint  Patient presents with  . Right Knee - Follow-up    73 year old male with history of LBP post lumbar HNP L3-4, L4-5 and L5-S1. He has had his wife pass away.  Some discomfort, he is watching his back, doing a painting as a side job. He is being careful and steady part Time. No bowel or bladder difficulty. He is taking intermittant pain associated with knee arthrosis. He is doing shoulder exercise with aching pain in the left shoulder.    Review of Systems  Constitutional: Negative.  Negative for activity change, appetite change, chills, diaphoresis, fatigue, fever and unexpected weight change.  HENT: Positive for tinnitus. Negative for congestion, dental problem, drooling, ear discharge, ear pain, facial swelling, hearing loss, mouth sores, nosebleeds, postnasal drip, rhinorrhea, sinus  pressure, sinus pain, sneezing, sore throat, trouble swallowing and voice change.   Eyes: Negative.  Negative for photophobia, pain, discharge, redness, itching and visual disturbance.  Respiratory: Negative.  Negative for apnea, cough, choking, chest tightness, shortness of breath, wheezing and stridor.   Cardiovascular: Negative.  Negative for chest pain, palpitations and leg swelling.  Gastrointestinal: Negative.  Negative for abdominal distention, abdominal pain, anal bleeding, blood in stool, constipation, diarrhea, nausea, rectal pain and vomiting.  Endocrine: Negative.  Negative for cold intolerance, heat intolerance, polydipsia, polyphagia and polyuria.  Genitourinary: Negative.  Negative for difficulty urinating, dysuria, enuresis, flank pain, frequency, hematuria and urgency.  Musculoskeletal: Positive for back pain and joint swelling. Negative for arthralgias, gait problem, myalgias, neck pain and neck stiffness.  Skin: Negative.  Negative for color change, pallor, rash and wound.  Allergic/Immunologic: Negative.  Negative for environmental allergies, food allergies and immunocompromised state.  Neurological: Negative for dizziness, tremors, seizures, syncope, facial asymmetry, speech difficulty, weakness, light-headedness, numbness and headaches.  Hematological: Negative.  Negative for adenopathy. Does not bruise/bleed easily.  Psychiatric/Behavioral: Negative.  Negative for agitation, behavioral problems, confusion, decreased concentration, dysphoric mood, hallucinations, self-injury, sleep disturbance and suicidal ideas. The patient is not nervous/anxious and is not hyperactive.      Objective: Vital Signs: BP 136/77 (BP Location: Left Arm, Patient Position: Sitting)   Pulse 80   Ht 5\' 11"  (1.803 m)   Wt 221 lb (  100.2 kg)   BMI 30.82 kg/m   Physical Exam  Back Exam   Tenderness  The patient is experiencing tenderness in the lumbar.  Range of Motion  Extension: abnormal   Flexion: abnormal  Lateral bend right: normal  Lateral bend left: normal  Rotation right: normal  Rotation left: normal   Muscle Strength  Right Quadriceps:  5/5  Left Quadriceps:  5/5  Right Hamstrings:  5/5  Left Hamstrings:  5/5   Tests  Straight leg raise right: negative Straight leg raise left: negative  Reflexes  Patellar:  0/4 abnormal Achilles:  0/4 abnormal Biceps: abnormal Babinski's sign: normal   Other  Toe walk: normal Heel walk: normal Sensation: normal Gait: normal  Erythema: no back redness Scars: present   Right Shoulder Exam   Tenderness  The patient is experiencing tenderness in the clavicle.  Range of Motion  Active abduction: abnormal  Passive abduction: abnormal  Extension: abnormal  External rotation: abnormal  Forward flexion: abnormal   Muscle Strength  Abduction: 5/5  Internal rotation: 5/5  External rotation: 5/5  Supraspinatus: 5/5  Subscapularis: 5/5  Biceps: 5/5    Left Shoulder Exam   Tenderness  The patient is experiencing tenderness in the acromion and acromioclavicular joint.  Range of Motion  Active abduction: normal  Passive abduction: normal  Extension: normal  External rotation: normal  Forward flexion: normal   Muscle Strength  Abduction: 5/5  Internal rotation: 5/5  External rotation: 5/5  Supraspinatus: 5/5  Subscapularis: 5/5  Biceps: 5/5       Specialty Comments:  No specialty comments available.  Imaging: No results found.   PMFS History: Patient Active Problem List   Diagnosis Date Noted  . Spinal stenosis of lumbar region with radiculopathy 10/30/2011    Priority: High    Class: Chronic  . OAB (overactive bladder) 04/04/2018  . Hearing loss due to cerumen impaction, bilateral 05/21/2017  . Seasonal allergic rhinitis due to pollen 11/01/2016  . Microalbuminuria due to type 2 diabetes mellitus ([place]) 07/27/2016  . Renal cell carcinoma ([place]) 10/06/2015  . Varicose veins of both  lower extremities 08/06/2015  . Varicose veins of lower extremities with ulcer ([place]) 06/16/2015  . Other constipation 08/03/2014  . Internal hemorrhoid, bleeding 08/03/2014  . Post traumatic stress disorder (PTSD) 01/29/2014  . Deficiency anemia 12/10/2013  . Radiation proctitis 06/04/2012  . OSA (obstructive sleep apnea) 11/24/2010  . Memory difficulty 11/24/2010  . Obesity, Class I, BMI 30-34.9 11/24/2010  . Routine general medical examination at a health care facility 11/24/2010  . ADENOCARCINOMA, PROSTATE, GLEASON GRADE 6 12/21/2009  . Type II diabetes mellitus with manifestations ([place]) 03/16/2007  . Hyperlipidemia with target LDL less than 100 03/16/2007  . Essential hypertension 03/16/2007  . GERD 03/16/2007   Past Medical History:  Diagnosis Date  . ADENOCARCINOMA, PROSTATE, GLEASON GRADE 6 12/21/2009   prostate cancer  . ALLERGIC RHINITIS 03/16/2007  . Allergy   . Anxiety   . Arthritis    back   . BACK PAIN WITH RADICULOPATHY 11/23/2008  . Cancer of kidney ([place])    partial right kidney removed  . CARPAL TUNNEL SYNDROME, BILATERAL 03/16/2007  . Chronic back pain   . Constipation    takes Senokot daily  . Depression    occasionally  . DIABETES MELLITUS, TYPE II 03/16/2007   takes Januvia and MEtformin daily  . GASTROINTESTINAL HEMORRHAGE, HX OF 03/16/2007  . GERD 03/16/2007   takes Omeprazole daily  . Glaucoma  mild - no eye drops  . Hemorrhoids   . History of colon polyps   . HYPERLIPIDEMIA 03/16/2007   takes Crestor daily  . HYPERTENSION 03/16/2007   takes Diltiazem and Lisinopril daily   . LEG CRAMPS 05/22/2007  . Nocturia   . Overactive bladder   . PARESTHESIA 05/21/2007  . Proctitis   . Rectal bleeding    Dr.Norins has explained its from the Radiation that he has received  . Rectal bleeding   . Sleep apnea    uses CPAP nightly  . Stomach cancer ([place])   . Urinary frequency    takes Toviaz daily    Family History  Problem Relation Age of Onset  . Cancer  Sister 30       Cancer, unsure type  . Hypertension Other   . Colon cancer Neg Hx   . Esophageal cancer Neg Hx   . Stomach cancer Neg Hx   . Diabetes Neg Hx   . Colon polyps Neg Hx   . Rectal cancer Neg Hx     Past Surgical History:  Procedure Laterality Date  . [place], 2008   repeat surgery; ESI '08  . CARPAL TUNNEL RELEASE     bilateral  . CERVICAL FUSION    . COLONOSCOPY    . colonosocpy    . ESOPHAGOGASTRODUODENOSCOPY     ED  . FLEXIBLE SIGMOIDOSCOPY N/A 01/08/2017   Procedure: FLEXIBLE SIGMOIDOSCOPY;  Surgeon: Irene Shipper, MD;  [facility] ENDOSCOPY;  Service: Endoscopy;  Laterality: N/A;  . HOT HEMOSTASIS N/A 01/08/2017   Procedure: HOT HEMOSTASIS (ARGON PLASMA COAGULATION/BICAP);  Surgeon: Irene Shipper, MD;  Location[facility] END[facility]COPY;  Service: Endoscopy;  Laterality: N/A;  . LUMBAR LAMINECTOMY  10/30/2011   Procedure: MICRODISCECTOMY LUMBAR LAMINECTOMY;  Surgeon: Jessy Oto, MD;  Location: [place];  Service: Orthopedics;  Laterality: Right;  Right L4-5 and L5-S1 Microdiscectomy  . LUMBAR LAMINECTOMY  06/17/2012   Procedure: MICRODISCECTOMY LUMBAR LAMINECTOMY;  Surgeon: Jessy Oto, MD;  Location: [place];  Service: Orthopedics;  Laterality: N/A;  Right L5-S1 microdiscectomy  . POLYPECTOMY    . PROSTATE SURGERY  march 2012   seed implant  . ROBOTIC ASSITED PARTIAL NEPHRECTOMY Right 09/08/2015   Procedure: XI ROBOTIC ASSITED RIGHT PARTIAL NEPHRECTOMY;  Surgeon: [name], MD;  Location: [facility];  Service: Urology;  Laterality: Right;  Clamp on: 1033 Clamp off: 1103 Total Clamp Time: 30 minutes  . SHOULDER ARTHROSCOPY WITH SUBACROMIAL DECOMPRESSION Left 01/20/2016   Procedure: LEFT SHOULDER ARTHROSCOPY WITH EXTENSIVE  DEBRIDEMENT, ACROMIOPLASTY;  Surgeon: Ninetta Lights, MD;  Location: [place];  Service: Orthopedics;  Laterality: Left;  . SHOULDER SURGERY  LFT  . Stress Cardiolite  12/24/2003   negative for ischemia  . UPPER ESOPHAGEAL  ENDOSCOPIC ULTRASOUND (EUS)  04/18/2016   [facility] hospital  . UPPER GASTROINTESTINAL ENDOSCOPY    . WOUND EXPLORATION     management of a wound that he sustained in the [place] History  . 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 Smoking status: Never Smoker  . Smokeless tobacco: Never Used  Substance and Sexual Activity  . Alcohol use: Yes    Alcohol/week: 0.0 standard drinks    Comment: occasional  . Drug use: No  . Sexual activity: Not Currently    Partners: Female

## 2018-08-19 NOTE — Patient Instructions (Addendum)
  Knee is suffering from osteoarthritis, only real proven treatments are Weight loss, Do not NSIADs like diclofenac and exercise. Well padded shoes help. Ice the knee 2-3 times a day 15-20 mins at a time. Plan: Avoid frequent bending and stooping  No lifting greater than 10 lbs. May use ice or moist heat for pain. Weight loss is of benefit. Do not take arthritis medications like motrin, celebrex and naprosyn due to history of kidney removal. Exercise is important to improve your indurance and does allow people to function better inspite of back pain. Call if you wish to consider a cortisone injection.

## 2018-08-31 ENCOUNTER — Other Ambulatory Visit: Payer: Self-pay | Admitting: Internal Medicine

## 2018-09-02 ENCOUNTER — Other Ambulatory Visit: Payer: Self-pay

## 2018-09-02 NOTE — Patient Outreach (Signed)
[facility]) Care Management  09/02/2018  RENAUD CELLI 08-09-1945 [id number]   Medication Adherence call to Mr. Leory Allinson patient did not answer patient is due on Janumet Xr 50/1000 mg under [place].   [place] Management Direct Dial [phone]  Fax [phone] [email or web address]

## 2018-09-27 ENCOUNTER — Other Ambulatory Visit: Payer: Self-pay

## 2018-09-27 ENCOUNTER — Ambulatory Visit (INDEPENDENT_AMBULATORY_CARE_PROVIDER_SITE_OTHER): Payer: Medicare Other | Admitting: Family

## 2018-09-27 ENCOUNTER — Other Ambulatory Visit (INDEPENDENT_AMBULATORY_CARE_PROVIDER_SITE_OTHER): Payer: Medicare Other

## 2018-09-27 ENCOUNTER — Other Ambulatory Visit: Payer: Self-pay | Admitting: Internal Medicine

## 2018-09-27 ENCOUNTER — Encounter: Payer: Self-pay | Admitting: Family

## 2018-09-27 VITALS — BP 136/80 | HR 74 | Temp 98.9°F | Ht 71.0 in | Wt 212.8 lb

## 2018-09-27 DIAGNOSIS — R42 Dizziness and giddiness: Secondary | ICD-10-CM

## 2018-09-27 DIAGNOSIS — E118 Type 2 diabetes mellitus with unspecified complications: Secondary | ICD-10-CM | POA: Diagnosis not present

## 2018-09-27 DIAGNOSIS — E1129 Type 2 diabetes mellitus with other diabetic kidney complication: Secondary | ICD-10-CM

## 2018-09-27 DIAGNOSIS — I10 Essential (primary) hypertension: Secondary | ICD-10-CM

## 2018-09-27 DIAGNOSIS — Z794 Long term (current) use of insulin: Secondary | ICD-10-CM

## 2018-09-27 DIAGNOSIS — R809 Proteinuria, unspecified: Secondary | ICD-10-CM

## 2018-09-27 LAB — COMPREHENSIVE METABOLIC PANEL
ALT: 16 U/L (ref 0–53)
AST: 16 U/L (ref 0–37)
Albumin: 4.3 g/dL (ref 3.5–5.2)
Alkaline Phosphatase: 76 U/L (ref 39–117)
BUN: 27 mg/dL — ABNORMAL HIGH (ref 6–23)
CO2: 21 mEq/L (ref 19–32)
Calcium: 9.7 mg/dL (ref 8.4–10.5)
Chloride: 107 mEq/L (ref 96–112)
Creatinine, Ser: 1.83 mg/dL — ABNORMAL HIGH (ref 0.40–1.50)
GFR: 44.19 mL/min — ABNORMAL LOW (ref >60.00)
Glucose, Bld: 140 mg/dL — ABNORMAL HIGH (ref 70–99)
Potassium: 4.6 mEq/L (ref 3.5–5.1)
Sodium: 137 mEq/L (ref 135–145)
Total Bilirubin: 0.4 mg/dL (ref 0.2–1.2)
Total Protein: 7.3 g/dL (ref 6.0–8.3)

## 2018-09-27 LAB — CBC WITH DIFFERENTIAL/PLATELET
Basophils Absolute: 0 10*3/uL (ref 0.0–0.1)
Basophils Relative: 1.2 % (ref 0.0–3.0)
Eosinophils Absolute: 0.1 10*3/uL (ref 0.0–0.7)
Eosinophils Relative: 2.4 % (ref 0.0–5.0)
HCT: 33.2 % — ABNORMAL LOW (ref 39.0–52.0)
Hemoglobin: 11.4 g/dL — ABNORMAL LOW (ref 13.0–17.0)
Lymphocytes Relative: 27.2 % (ref 12.0–46.0)
Lymphs Abs: 1 10*3/uL (ref 0.7–4.0)
MCHC: 34.5 g/dL (ref 30.0–36.0)
MCV: 87 fl (ref 78.0–100.0)
Monocytes Absolute: 0.5 10*3/uL (ref 0.1–1.0)
Monocytes Relative: 12.9 % — ABNORMAL HIGH (ref 3.0–12.0)
Neutro Abs: 2 10*3/uL (ref 1.4–7.7)
Neutrophils Relative %: 56.3 % (ref 43.0–77.0)
Platelets: 194 10*3/uL (ref 150.0–400.0)
RBC: 3.82 Mil/uL — ABNORMAL LOW (ref 4.22–5.81)
RDW: 15 % (ref 11.5–15.5)
WBC: 3.6 10*3/uL — ABNORMAL LOW (ref 4.0–10.5)

## 2018-09-27 LAB — HEMOGLOBIN A1C: Hgb A1c MFr Bld: 7.5 % — ABNORMAL HIGH (ref 4.6–6.5)

## 2018-09-27 MED ORDER — FLUTICASONE PROPIONATE 50 MCG/ACT NA SUSP: 2.0000 | Freq: Every day | NASAL | 6 refills | Status: DC

## 2018-09-27 MED ORDER — MECLIZINE HCL 12.5 MG PO TABS: 12.5000 mg | ORAL_TABLET | Freq: Three times a day (TID) | ORAL | 0 refills | Status: DC | PRN

## 2018-09-27 NOTE — Progress Notes (Signed)
Antonio Adams is a 73 y.o. male with the following history as recorded in EpicCare:  Patient Active Problem List   Diagnosis Date Noted  . OAB (overactive bladder) 04/04/2018  . Hearing loss due to cerumen impaction, bilateral 05/21/2017  . Seasonal allergic rhinitis due to pollen 11/01/2016  . Microalbuminuria due to type 2 diabetes mellitus ([place]) 07/27/2016  . Renal cell carcinoma ([place]) 10/06/2015  . Varicose veins of both lower extremities 08/06/2015  . Varicose veins of lower extremities with ulcer ([place]) 06/16/2015  . Other constipation 08/03/2014  . Internal hemorrhoid, bleeding 08/03/2014  . Post traumatic stress disorder (PTSD) 01/29/2014  . Deficiency anemia 12/10/2013  . Radiation proctitis 06/04/2012  . Spinal stenosis of lumbar region with radiculopathy 10/30/2011    Class: Chronic  . OSA (obstructive sleep apnea) 11/24/2010  . Memory difficulty 11/24/2010  . Obesity, Class I, BMI 30-34.9 11/24/2010  . Routine general medical examination at a health care facility 11/24/2010  . ADENOCARCINOMA, PROSTATE, GLEASON GRADE 6 12/21/2009  . Type II diabetes mellitus with manifestations ([place]) 03/16/2007  . Hyperlipidemia with target LDL less than 100 03/16/2007  . Essential hypertension 03/16/2007  . GERD 03/16/2007    Current Outpatient Medications  Medication Sig Dispense Refill  . aspirin EC 81 MG tablet Take 81 mg by mouth daily.    Marland Kitchen atorvastatin (LIPITOR) 40 MG tablet Take 1 tablet (40 mg total) by mouth daily. 90 tablet 1  . Azilsartan-Chlorthalidone (EDARBYCLOR) 40-12.5 MG TABS Take 1 tablet by mouth daily. 90 tablet 1  . cholecalciferol (VITAMIN D) 1000 units tablet Take 1,000 Units by mouth daily.    . citalopram (CELEXA) 40 MG tablet Take 1 tablet by mouth daily.    . clotrimazole-betamethasone (LOTRISONE) cream APPLY 1 APPLICATION TOPICALLY 2 (TWO) TIMES DAILY. 30 g 0  . diclofenac sodium (VOLTAREN) 1 % GEL Apply 2 g topically 4 (four) times daily. 2 Tube 0  .  ferrous sulfate 325 (65 FE) MG tablet Take 1 tablet (325 mg total) by mouth 2 (two) times daily. 60 tablet 6  . fluticasone (FLONASE) 50 MCG/ACT nasal spray Place 2 sprays into both nostrils daily. 16 g 6  . gabapentin (NEURONTIN) 300 MG capsule Take 1 capsule (300 mg total) by mouth at bedtime. 90 capsule 3  . Insulin Pen Needle (NOVOFINE) 32G X 6 MM MISC 1 Act by Does not apply route daily. USE TID 100 each 11  . JANUMET XR 50-1000 MG TB24 TAKE 1 TABLET BY MOUTH EVERY DAY 30 tablet 5  . LANTUS SOLOSTAR 100 UNIT/ML Solostar Pen INJECT 50 UNITS INTO THE SKIN EVERY MORNING. 3 mL 1  . meclizine (ANTIVERT) 12.5 MG tablet Take 1 tablet (12.5 mg total) by mouth 3 (three) times daily as needed for dizziness. 30 tablet 0  . mirabegron ER (MYRBETRIQ) 25 MG TB24 tablet Take 1 tablet (25 mg total) by mouth daily. 90 tablet 1  . Needles & Syringes MISC Use syringe and needle to inject insulin into the skin as directed on insulin prescription. DX E11.09 90 each 3  . NOVOFINE PLUS 32G X 4 MM MISC USE DAILY WITH INSULIN 100 each 2  . omeprazole (PRILOSEC) 40 MG capsule TAKE ONE CAPSULE EVERY DAY 30 capsule 10  . ONE TOUCH ULTRA TEST test strip USE 2 (TWO) TIMES DAILY. AND LANCETS 2/DAY 250.03 100 each 11  . tamsulosin (FLOMAX) 0.4 MG CAPS capsule TAKE 1 CAPSULE (0.4 MG TOTAL) BY MOUTH DAILY. 30 capsule 1  . telmisartan (MICARDIS)  40 MG tablet TAKE 1 TABLET (40 MG TOTAL) BY MOUTH DAILY. 90 tablet 0   No current facility-administered medications for this visit.     Allergies: Ace inhibitors  Past Medical History:  Diagnosis Date  . ADENOCARCINOMA, PROSTATE, GLEASON GRADE 6 12/21/2009   prostate cancer  . ALLERGIC RHINITIS 03/16/2007  . Allergy   . Anxiety   . Arthritis    back   . BACK PAIN WITH RADICULOPATHY 11/23/2008  . Cancer of kidney ([place])    partial right kidney removed  . CARPAL TUNNEL SYNDROME, BILATERAL 03/16/2007  . Chronic back pain   . Constipation    takes Senokot daily  . Depression     occasionally  . DIABETES MELLITUS, TYPE II 03/16/2007   takes Januvia and MEtformin daily  . GASTROINTESTINAL HEMORRHAGE, HX OF 03/16/2007  . GERD 03/16/2007   takes Omeprazole daily  . Glaucoma    mild - no eye drops  . Hemorrhoids   . History of colon polyps   . HYPERLIPIDEMIA 03/16/2007   takes Crestor daily  . HYPERTENSION 03/16/2007   takes Diltiazem and Lisinopril daily   . LEG CRAMPS 05/22/2007  . Nocturia   . Overactive bladder   . PARESTHESIA 05/21/2007  . Proctitis   . Rectal bleeding    Dr.Norins has explained its from the Radiation that he has received  . Rectal bleeding   . Sleep apnea    uses CPAP nightly  . Stomach cancer ([place])   . Urinary frequency    takes Toviaz daily    Past Surgical History:  Procedure Laterality Date  . [place], 2008   repeat surgery; ESI '08  . CARPAL TUNNEL RELEASE     bilateral  . CERVICAL FUSION    . COLONOSCOPY    . colonosocpy    . ESOPHAGOGASTRODUODENOSCOPY     ED  . FLEXIBLE SIGMOIDOSCOPY N/A 01/08/2017   Procedure: FLEXIBLE SIGMOIDOSCOPY;  Surgeon: Irene Shipper, MD;  Location: [facility] ENDOSCOPY;  Service: Endoscopy;  Laterality: N/A;  . HOT HEMOSTASIS N/A 01/08/2017   Procedure: HOT HEMOSTASIS (ARGON PLASMA COAGULATION/BICAP);  Surgeon: Irene Shipper, MD;  Location: [facility] ENDOSCOPY;  Service: Endoscopy;  Laterality: N/A;  . LUMBAR LAMINECTOMY  10/30/2011   Procedure: MICRODISCECTOMY LUMBAR LAMINECTOMY;  Surgeon: Jessy Oto, MD;  Location: [place];  Service: Orthopedics;  Laterality: Right;  Right L4-5 and L5-S1 Microdiscectomy  . LUMBAR LAMINECTOMY  06/17/2012   Procedure: MICRODISCECTOMY LUMBAR LAMINECTOMY;  Surgeon: Jessy Oto, MD;  Location: [place];  Service: Orthopedics;  Laterality: N/A;  Right L5-S1 microdiscectomy  . POLYPECTOMY    . PROSTATE SURGERY  march 2012   seed implant  . ROBOTIC ASSITED PARTIAL NEPHRECTOMY Right 09/08/2015   Procedure: XI ROBOTIC ASSITED RIGHT PARTIAL NEPHRECTOMY;  Surgeon: [name],  MD;  [facility];  Service: Urology;  Laterality: Right;  Clamp on: 1033 Clamp off: 1103 Total Clamp Time: 30 minutes  . SHOULDER ARTHROSCOPY WITH SUBACROMIAL DECOMPRESSION Left 01/20/2016   Procedure: LEFT SHOULDER ARTHROSCOPY WITH EXTENSIVE  DEBRIDEMENT, ACROMIOPLASTY;  Surgeon: Ninetta Lights, MD;  Location: [place];  Service: Orthopedics;  Laterality: Left;  . SHOULDER SURGERY  LFT  . Stress Cardiolite  12/24/2003   negative for ischemia  . UPPER ESOPHAGEAL ENDOSCOPIC ULTRASOUND (EUS)  04/18/2016   [facility] hospital  . UPPER GASTROINTESTINAL ENDOSCOPY    . WOUND EXPLORATION     management of a wound that he sustained in the [employer]  Family History  Problem Relation Age of Onset  . Cancer Sister 30       Cancer, unsure type  . Hypertension Other   . Colon cancer Neg Hx   . Esophageal cancer Neg Hx   . Stomach cancer Neg Hx   . Diabetes Neg Hx   . Colon polyps Neg Hx   . Rectal cancer Neg Hx     Social History   Tobacco Use  . Smoking status: Never Smoker  . Smokeless tobacco: Never Used  Substance Use Topics  . Alcohol use: Yes    Alcohol/week: 0.0 standard drinks    Comment: occasional    Subjective:  Patient seen in the office today with concerns for symptoms of feeling "off balance"/ dizzy for the past 1-2 weeks; notes the room does occasionally feel like it is spinning; Denies any chest pain, shortness of breath, blurred vision or headache. Blood sugar has been very well controlled; in the past, needed to have ear wax removed when he experienced these type of symptoms- wonders if his ears need to be flushed today; admits that does have seasonal allergies to pollen but does not typically take allergy medication;     Objective:  Vitals:   09/27/18 1339  BP: 136/80  Pulse: 74  Temp: 98.9 F (37.2 C)  TempSrc: Oral  SpO2: 97%  Weight: 212 lb 12.8 oz (96.5 kg)  Height: '5\' 11"'  (1.803 m)    General: Well developed, well nourished, in no acute  distress  Skin : Warm and dry.  Head: Normocephalic and atraumatic  Eyes: Sclera and conjunctiva clear; pupils round and reactive to light; extraocular movements intact  Ears: External normal; canals clear; tympanic membranes congested bilaterally  Oropharynx: Pink, supple. No suspicious lesions  Neck: Supple without thyromegaly, adenopathy; no carotid bruits noted Lungs: Respirations unlabored; clear to auscultation bilaterally without wheeze, rales, rhonchi  CVS exam: normal rate and regular rhythm.  Abdomen: Soft; nontender; nondistended; normoactive bowel sounds; no masses or hepatosplenomegaly  Musculoskeletal: No deformities; no active joint inflammation  Extremities: No edema, cyanosis, clubbing  Vessels: Symmetric bilaterally  Neurologic: Alert and oriented; speech intact; face symmetrical; moves all extremities well; CNII-XII intact without focal deficit   Assessment:  1. Dizziness   2. Type II diabetes mellitus with manifestations ([place])     Plan:  Suspect eustachian tube dysfunction; physical exam is reassuring; EKG shows NSR; will update labs today; trial of Flonase and Antivert; follow up to be determined.   No follow-ups on file.  Orders Placed This Encounter  Procedures  . CBC w/Diff    Standing Status:   Future    Number of Occurrences:   1    Standing Expiration Date:   09/27/2019  . Comp Met (CMET)    Standing Status:   Future    Number of Occurrences:   1    Standing Expiration Date:   09/27/2019  . HgB A1c    Standing Status:   Future    Number of Occurrences:   1    Standing Expiration Date:   09/27/2019  . EKG 12-Lead    Requested Prescriptions   Signed Prescriptions Disp Refills  . fluticasone (FLONASE) 50 MCG/ACT nasal spray 16 g 6    Sig: Place 2 sprays into both nostrils daily.  . meclizine (ANTIVERT) 12.5 MG tablet 30 tablet 0    Sig: Take 1 tablet (12.5 mg total) by mouth 3 (three) times daily as needed for dizziness.

## 2018-09-27 NOTE — Patient Instructions (Signed)
Eustachian Tube Dysfunction  Eustachian tube dysfunction refers to a condition in which a blockage develops in the narrow passage that connects the middle ear to the back of the nose (eustachian tube). The eustachian tube regulates air pressure in the middle ear by letting air move between the ear and nose. It also helps to drain fluid from the middle ear space. Eustachian tube dysfunction can affect one or both ears. When the eustachian tube does not function properly, air pressure, fluid, or both can build up in the middle ear. What are the causes? This condition occurs when the eustachian tube becomes blocked or cannot open normally. Common causes of this condition include:  Ear infections.  Colds and other infections that affect the nose, mouth, and throat (upper respiratory tract).  Allergies.  Irritation from cigarette smoke.  Irritation from stomach acid coming up into the esophagus (gastroesophageal reflux). The esophagus is the tube that carries food from the mouth to the stomach.  Sudden changes in air pressure, such as from descending in an airplane or scuba diving.  Abnormal growths in the nose or throat, such as: ? Growths that line the nose (nasal polyps). ? Abnormal growth of cells (tumors). ? Enlarged tissue at the back of the throat (adenoids). What increases the risk? You are more likely to develop this condition if:  You smoke.  You are overweight.  You are a child who has: ? Certain birth defects of the mouth, such as cleft palate. ? Large tonsils or adenoids. What are the signs or symptoms? Common symptoms of this condition include:  A feeling of fullness in the ear.  Ear pain.  Clicking or popping noises in the ear.  Ringing in the ear.  Hearing loss.  Loss of balance.  Dizziness. Symptoms may get worse when the air pressure around you changes, such as when you travel to an area of high elevation, fly on an airplane, or go scuba diving. How is  this diagnosed? This condition may be diagnosed based on:  Your symptoms.  A physical exam of your ears, nose, and throat.  Tests, such as those that measure: ? The movement of your eardrum (tympanogram). ? Your hearing (audiometry). How is this treated? Treatment depends on the cause and severity of your condition.  In mild cases, you may relieve your symptoms by moving air into your ears. This is called "popping the ears."  In more severe cases, or if you have symptoms of fluid in your ears, treatment may include: ? Medicines to relieve congestion (decongestants). ? Medicines that treat allergies (antihistamines). ? Nasal sprays or ear drops that contain medicines that reduce swelling (steroids). ? A procedure to drain the fluid in your eardrum (myringotomy). In this procedure, a small tube is placed in the eardrum to:  Drain the fluid.  Restore the air in the middle ear space. ? A procedure to insert a balloon device through the nose to inflate the opening of the eustachian tube (balloon dilation). Follow these instructions at home: Lifestyle  Do not do any of the following until your health care provider approves: ? Travel to high altitudes. ? Fly in airplanes. ? Work in a pressurized cabin or room. ? Scuba dive.  Do not use any products that contain nicotine or tobacco, such as cigarettes and e-cigarettes. If you need help quitting, ask your health care provider.  Keep your ears dry. Wear fitted earplugs during showering and bathing. Dry your ears completely after. General instructions  Take over-the-counter   and prescription medicines only as told by your health care provider.  Use techniques to help pop your ears as recommended by your health care provider. These may include: ? Chewing gum. ? Yawning. ? Frequent, forceful swallowing. ? Closing your mouth, holding your nose closed, and gently blowing as if you are trying to blow air out of your nose.  Keep all  follow-up visits as told by your health care provider. This is important. Contact a health care provider if:  Your symptoms do not go away after treatment.  Your symptoms come back after treatment.  You are unable to pop your ears.  You have: ? A fever. ? Pain in your ear. ? Pain in your head or neck. ? Fluid draining from your ear.  Your hearing suddenly changes.  You become very dizzy.  You lose your balance. Summary  Eustachian tube dysfunction refers to a condition in which a blockage develops in the eustachian tube.  It can be caused by ear infections, allergies, inhaled irritants, or abnormal growths in the nose or throat.  Symptoms include ear pain, hearing loss, or ringing in the ears.  Mild cases are treated with maneuvers to unblock the ears, such as yawning or ear popping.  Severe cases are treated with medicines. Surgery may also be done (rare). This information is not intended to replace advice given to you by your health care provider. Make sure you discuss any questions you have with your health care provider. Document Released: 05/28/2015 Document Revised: 08/21/2017 Document Reviewed: 08/21/2017 Elsevier Interactive Patient Education  2019 Elsevier Inc.  

## 2018-10-08 ENCOUNTER — Other Ambulatory Visit: Payer: Self-pay

## 2018-10-08 ENCOUNTER — Encounter: Payer: Self-pay | Admitting: Internal Medicine

## 2018-10-08 ENCOUNTER — Ambulatory Visit (INDEPENDENT_AMBULATORY_CARE_PROVIDER_SITE_OTHER): Payer: Medicare Other | Admitting: Internal Medicine

## 2018-10-08 ENCOUNTER — Ambulatory Visit (INDEPENDENT_AMBULATORY_CARE_PROVIDER_SITE_OTHER)
Admission: RE | Admit: 2018-10-08 | Discharge: 2018-10-08 | Disposition: A | Discharge status: Home / Self Care | Payer: Medicare Other | Source: Ambulatory Visit | Attending: Internal Medicine | Admitting: Internal Medicine

## 2018-10-08 VITALS — BP 134/60 | HR 74 | Temp 98.2°F | Resp 16 | Ht 71.0 in | Wt 218.8 lb

## 2018-10-08 DIAGNOSIS — M5030 Other cervical disc degeneration, unspecified cervical region: Secondary | ICD-10-CM

## 2018-10-08 DIAGNOSIS — M542 Cervicalgia: Secondary | ICD-10-CM | POA: Insufficient documentation

## 2018-10-08 DIAGNOSIS — N183 Chronic kidney disease, stage 3 unspecified: Secondary | ICD-10-CM | POA: Insufficient documentation

## 2018-10-08 DIAGNOSIS — R002 Palpitations: Secondary | ICD-10-CM | POA: Insufficient documentation

## 2018-10-08 DIAGNOSIS — R011 Cardiac murmur, unspecified: Secondary | ICD-10-CM | POA: Diagnosis not present

## 2018-10-08 DIAGNOSIS — E118 Type 2 diabetes mellitus with unspecified complications: Secondary | ICD-10-CM | POA: Diagnosis not present

## 2018-10-08 MED ORDER — TRAMADOL HCL 50 MG PO TABS: 50.0000 mg | ORAL_TABLET | Freq: Four times a day (QID) | ORAL | 3 refills | Status: AC | PRN

## 2018-10-08 NOTE — Progress Notes (Signed)
Subjective:  Patient ID: Antonio Adams, male    DOB: May 24, 1945  Age: 73 y.o. MRN: [id number]  CC: Neck Pain and Palpitations   HPI THADD APUZZO presents for 2 concerns.  1.  He complains of a 3-week history of midline, posterior neck pain that he describes as a nonradiating soreness.  He says the pain started after he fell asleep in a recliner.  He has not taken anything for the pain.  He has chronic weakness in his left upper extremity that is not worse than usual.  He denies numbness or tingling.  2.  He also complains that earlier today he had an episode of what he describes as heart fluttering.  He says his heart fluttered off and on for several minutes.  He felt dizzy at the time.  He thinks his heart rate was fast but not irregular.  He was not aware of any extra or skipped beats.  He denies diaphoresis, near syncope, lightheadedness, chest pain, edema, or shortness of breath.  Outpatient Medications Prior to Visit  Medication Sig Dispense Refill  . aspirin EC 81 MG tablet Take 81 mg by mouth daily.    Marland Kitchen atorvastatin (LIPITOR) 40 MG tablet Take 1 tablet (40 mg total) by mouth daily. 90 tablet 1  . Azilsartan-Chlorthalidone (EDARBYCLOR) 40-12.5 MG TABS Take 1 tablet by mouth daily. 90 tablet 1  . cholecalciferol (VITAMIN D) 1000 units tablet Take 1,000 Units by mouth daily.    . citalopram (CELEXA) 40 MG tablet Take 1 tablet by mouth daily.    . clotrimazole-betamethasone (LOTRISONE) cream APPLY 1 APPLICATION TOPICALLY 2 (TWO) TIMES DAILY. 30 g 0  . diclofenac sodium (VOLTAREN) 1 % GEL Apply 2 g topically 4 (four) times daily. 2 Tube 0  . ferrous sulfate 325 (65 FE) MG tablet Take 1 tablet (325 mg total) by mouth 2 (two) times daily. 60 tablet 6  . fluticasone (FLONASE) 50 MCG/ACT nasal spray Place 2 sprays into both nostrils daily. 16 g 6  . gabapentin (NEURONTIN) 300 MG capsule Take 1 capsule (300 mg total) by mouth at bedtime. 90 capsule 3  . hydrOXYzine  (ATARAX/VISTARIL) 25 MG tablet     . Insulin Pen Needle (NOVOFINE) 32G X 6 MM MISC 1 Act by Does not apply route daily. USE TID 100 each 11  . JANUMET XR 50-1000 MG TB24 TAKE 1 TABLET BY MOUTH EVERY DAY 30 tablet 5  . LANTUS SOLOSTAR 100 UNIT/ML Solostar Pen INJECT 50 UNITS INTO THE SKIN EVERY MORNING. 3 mL 1  . meclizine (ANTIVERT) 12.5 MG tablet Take 1 tablet (12.5 mg total) by mouth 3 (three) times daily as needed for dizziness. 30 tablet 0  . mirabegron ER (MYRBETRIQ) 25 MG TB24 tablet Take 1 tablet (25 mg total) by mouth daily. 90 tablet 1  . Needles & Syringes MISC Use syringe and needle to inject insulin into the skin as directed on insulin prescription. DX E11.09 90 each 3  . NOVOFINE PLUS 32G X 4 MM MISC USE DAILY WITH INSULIN 100 each 2  . omeprazole (PRILOSEC) 40 MG capsule TAKE ONE CAPSULE EVERY DAY 30 capsule 10  . ONE TOUCH ULTRA TEST test strip USE 2 (TWO) TIMES DAILY. AND LANCETS 2/DAY 250.03 100 each 11  . tamsulosin (FLOMAX) 0.4 MG CAPS capsule TAKE 1 CAPSULE (0.4 MG TOTAL) BY MOUTH DAILY. 30 capsule 1  . telmisartan (MICARDIS) 40 MG tablet TAKE 1 TABLET (40 MG TOTAL) BY MOUTH DAILY. 90 tablet 0  No facility-administered medications prior to visit.     ROS Review of Systems  Constitutional: Negative.  Negative for chills, fatigue and fever.  HENT: Negative.  Negative for sore throat and trouble swallowing.   Eyes: Negative.  Negative for visual disturbance.  Respiratory: Negative for chest tightness, shortness of breath and wheezing.   Cardiovascular: Positive for palpitations. Negative for chest pain and leg swelling.  Gastrointestinal: Negative for abdominal pain, constipation, diarrhea, nausea and vomiting.  Endocrine: Negative.   Genitourinary: Negative.   Musculoskeletal: Positive for neck pain. Negative for arthralgias, back pain and neck stiffness.  Skin: Negative.  Negative for color change and rash.  Neurological: Positive for dizziness and weakness. Negative  for syncope, light-headedness and numbness.  Hematological: Negative for adenopathy. Does not bruise/bleed easily.  Psychiatric/Behavioral: Negative.     Objective:  BP 134/60 (BP Location: Left Arm, Patient Position: Sitting, Cuff Size: Normal)   Pulse 74   Temp 98.2 F (36.8 C) (Oral)   Resp 16   Ht 5\' 11"  (1.803 m)   Wt 218 lb 12 oz (99.2 kg)   SpO2 97%   BMI 30.51 kg/m   BP Readings from Last 3 Encounters:  10/08/18 134/60  09/27/18 136/80  08/19/18 136/77    Wt Readings from Last 3 Encounters:  10/08/18 218 lb 12 oz (99.2 kg)  09/27/18 212 lb 12.8 oz (96.5 kg)  08/19/18 221 lb (100.2 kg)    Physical Exam Vitals signs reviewed.  Constitutional:      Appearance: He is not ill-appearing or diaphoretic.  HENT:     Mouth/Throat:     Mouth: Mucous membranes are moist.  Eyes:     Conjunctiva/sclera: Conjunctivae normal.  Neck:     Musculoskeletal: Normal range of motion and neck supple. No neck rigidity.  Cardiovascular:     Rate and Rhythm: Normal rate and regular rhythm.     Heart sounds: S1 normal and S2 normal. Murmur present. Systolic murmur present with a grade of 1/6. No diastolic murmur. No gallop.      Comments: EKG ---  Sinus  Rhythm  -First degree A-V block  PRi = 222 BORDERLINE RHYTHM- no significant change from the prior EKG  Pulmonary:     Effort: Pulmonary effort is normal. No respiratory distress.     Breath sounds: Normal breath sounds. No stridor. No wheezing, rhonchi or rales.  Abdominal:     General: Abdomen is flat. Bowel sounds are normal.     Palpations: There is no mass.     Tenderness: There is no abdominal tenderness.  Musculoskeletal: Normal range of motion.     Cervical back: Normal. He exhibits normal range of motion, no tenderness, no bony tenderness, no swelling, no edema, no deformity, no pain and no spasm.     Right lower leg: No edema.     Left lower leg: No edema.  Lymphadenopathy:     Cervical: No cervical adenopathy.   Skin:    General: Skin is warm.  Neurological:     Mental Status: Mental status is at baseline.     Cranial Nerves: Cranial nerves are intact.     Sensory: Sensation is intact.     Motor: Weakness present. No tremor, atrophy, abnormal muscle tone or seizure activity.     Coordination: Coordination is intact. Romberg sign negative. Coordination normal.     Gait: Gait is intact. Gait normal.     Deep Tendon Reflexes: Reflexes are normal and symmetric. Reflexes normal.  Comments: He has mild diffuse weakness in his left upper extremity but he tells me this is not a new finding for him.     Lab Results  Component Value Date   WBC 3.6 (L) 09/27/2018   HGB 11.4 (L) 09/27/2018   HCT 33.2 (L) 09/27/2018   PLT 194.0 09/27/2018   GLUCOSE 140 (H) 09/27/2018   CHOL 167 01/03/2018   TRIG 89.0 01/03/2018   HDL 36.70 (L) 01/03/2018   LDLCALC 113 (H) 01/03/2018   ALT 16 09/27/2018   AST 16 09/27/2018   NA 137 09/27/2018   K 4.6 09/27/2018   CL 107 09/27/2018   CREATININE 1.83 (H) 09/27/2018   BUN 27 (H) 09/27/2018   CO2 21 09/27/2018   TSH 1.38 01/03/2018   PSA 0.046 08/25/2016   INR 1.02 07/14/2010   HGBA1C 7.5 (H) 09/27/2018   MICROALBUR 28.5 (H) 01/03/2018    Dg Chest 2 View  Result Date: 01/07/2018 CLINICAL DATA:  73 year old male with history of treated renal cell carcinoma in 2017. Subsequent encounter. EXAM: CHEST - 2 VIEW COMPARISON:  CT Abdomen and Pelvis today reported separately. Chest radiographs 03/28/2016 and earlier. FINDINGS: Stable lung volumes and mediastinal contours. Cardiac size at the upper limits of normal to mildly increased. Mildly tortuous thoracic aorta. Visualized tracheal air column is within normal limits. Lung parenchyma appears stable and clear. No pneumothorax or pleural effusion. No pulmonary nodule. Stable visualized osseous structures. Negative visible bowel gas pattern. IMPRESSION: No acute or metastatic process identified radiographically.  Electronically Signed   By: [name] M.D.   On: 01/07/2018 15:17    Dg Cervical Spine Complete  Result Date: 10/08/2018 CLINICAL DATA:  Neck pain for 2 weeks. EXAM: CERVICAL SPINE - COMPLETE 4+ VIEW COMPARISON:  Radiographs of May 03, 2009. FINDINGS: No fracture or spondylolisthesis is noted. Status post surgical anterior fusion of C5-6. Moderate degenerative disc disease is noted at C6-7 with anterior osteophyte formation. No prevertebral soft tissue swelling is noted. No significant neural foraminal stenosis is noted. IMPRESSION: Postsurgical and degenerative changes as described above. No acute abnormality seen in the cervical spine. Electronically Signed   By: Marijo Conception M.D.   On: 10/08/2018 12:26    Assessment & Plan:   Reyaan was seen today for neck pain and palpitations.  Diagnoses and all orders for this visit:  Palpitations- The palpitations sound benign.  His EKG shows first-degree AV block but no other changes.  I have asked him to undergo a cardiac event monitor to screen for dysrhythmias like atrial fibrillation/flutter, SVT, tachybradycardia syndrome, and V. tach. -     EKG 12-Lead -     CARDIAC EVENT MONITOR; Future  Neck pain, bilateral- Hisplain films show that the surgical hardware is in place with no complications.  There is also evidence of DDD with spurring but no evidence of spinal stenosis.  He has renal insufficiency so have asked him to avoid NSAIDs.  We will try to control the pain with tramadol as needed. -     DG Cervical Spine Complete; Future -     traMADol (ULTRAM) 50 MG tablet; Take 1 tablet (50 mg total) by mouth every 6 (six) hours as needed for up to 5 days.  Type II diabetes mellitus with manifestations (HCC)  CKD (chronic kidney disease) stage 3, GFR 30-59 ml/min (HCC)-he will avoid nephrotoxic agents.  Murmur, cardiac- He has a new onset systolic murmur and palpitations.  I have asked him to undergo an echocardiogram  to see if there is  valvular disorder or any issues with the walls or chambers. -     ECHOCARDIOGRAM COMPLETE; Future  DDD (degenerative disc disease), cervical -     traMADol (ULTRAM) 50 MG tablet; Take 1 tablet (50 mg total) by mouth every 6 (six) hours as needed for up to 5 days.   I am having Otila Back. Johnnye Sima start on traMADol. I am also having him maintain his omeprazole, tamsulosin, Needles & Syringes, aspirin EC, cholecalciferol, NovoFine Plus, Insulin Pen Needle, ONE TOUCH ULTRA TEST, citalopram, Lantus SoloStar, atorvastatin, Azilsartan-Chlorthalidone, mirabegron ER, ferrous sulfate, clotrimazole-betamethasone, diclofenac sodium, gabapentin, Janumet XR, telmisartan, fluticasone, meclizine, and hydrOXYzine.  Meds ordered this encounter  Medications  . traMADol (ULTRAM) 50 MG tablet    Sig: Take 1 tablet (50 mg total) by mouth every 6 (six) hours as needed for up to 5 days.    Dispense:  65 tablet    Refill:  3     Follow-up: No follow-ups on file.  Scarlette Calico, MD

## 2018-10-08 NOTE — Patient Instructions (Signed)
Acute Torticollis, Adult  Torticollis is a condition in which the muscles of the neck tighten (contract) abnormally, causing the neck to twist and the head to move into an unnatural position. Torticollis that develops suddenly is called acute torticollis. People with acute torticollis may have trouble turning their head. The condition can be painful and may range from mild to severe.  What are the causes?  This condition may be caused by:   Sleeping in an awkward position (common).   Extending or twisting the neck muscles beyond their normal position.   An injury to the neck muscles.   An infection.   A tumor.   Certain medicines.   Long-lasting spasms of the neck muscles.  In some cases, the cause may not be known.  What increases the risk?  You are more likely to develop this condition if:   You have a condition associated with loose ligaments, such as Down syndrome.   You have a brain condition that affects vision, such as strabismus.  What are the signs or symptoms?  The main symptom of this condition is tilting of the head to one side. Other symptoms include:   Pain in the neck.   Trouble turning the head from side to side or up and down.  How is this diagnosed?  This condition may be diagnosed based on:   A physical exam.   Your medical history.   Imaging tests, such as:  ? An X-ray.  ? An ultrasound.  ? A CT scan.  ? An MRI.  How is this treated?  Treatment for this condition depends on what is causing the condition. Mild cases may go away without treatment. Treatment for more serious cases may include:   Medicines or shots to relax the muscles.   Other medicines, such as antibiotics to treat the underlying cause.   Wearing a soft neck collar.   Physical therapy and stretching to improve neck strength and flexibility.   Neck massage.  In severe cases, surgery may be needed to repair dislocated or broken bones or to treat nerves in the neck.  Follow these instructions at home:     Take  over-the-counter and prescription medicines only as told by your health care provider.   Do stretching exercises and massage your neck as told by your health care provider.   If directed, apply heat to the affected area as often as told by your health care provider. Use the heat source that your health care provider recommends, such as a moist heat pack or a heating pad.  ? Place a towel between your skin and the heat source.  ? Leave the heat on for 20-30 minutes.  ? Remove the heat if your skin turns bright red. This is especially important if you are unable to feel pain, heat, or cold. You may have a greater risk of getting burned.   If you wake up with torticollis after sleeping, check your bed or sleeping area. Look for lumpy pillows or unusual objects. Make sure your bed and sleeping area are comfortable.   Keep all follow-up visits as told by your health care provider. This is important.  Contact a health care provider if:   You have a fever.   Your symptoms do not improve or they get worse.  Get help right away if:   You have trouble breathing.   You develop noisy breathing (stridor).   You start to drool.   You have trouble swallowing or pain   when swallowing.   You develop numbness or weakness in your hands or feet.   You have changes in your speech, understanding, or vision.   You are in severe pain.   You cannot move your head or neck.  Summary   Torticollis is a condition in which the muscles of the neck tighten (contract) abnormally, causing the neck to twist and the head to move into an unnatural position. Torticollis that develops suddenly is called acute torticollis.   Treatment for this condition depends on what is causing the condition. Mild cases may go away without treatment.   Do stretching exercises and massage your neck as told by your health care provider. You may also be instructed to apply heat to the area.   Contact your health care provider if your symptoms do not  improve or they get worse.  This information is not intended to replace advice given to you by your health care provider. Make sure you discuss any questions you have with your health care provider.  Document Released: 04/28/2000 Document Revised: 06/29/2016 Document Reviewed: 06/29/2016  Elsevier Interactive Patient Education  2019 [employer] Inc[employer]

## 2018-10-10 ENCOUNTER — Telehealth: Payer: Self-pay | Admitting: Radiology

## 2018-10-10 NOTE — Telephone Encounter (Signed)
Enrolled patient for a 30 Day Preventice Event monitor to be mailed. Brief instructions were gone over with the patient and he knows to expect the monitor to arrive in 3-4 days

## 2018-10-16 ENCOUNTER — Ambulatory Visit (INDEPENDENT_AMBULATORY_CARE_PROVIDER_SITE_OTHER): Payer: Medicare Other

## 2018-10-16 DIAGNOSIS — R002 Palpitations: Secondary | ICD-10-CM | POA: Diagnosis not present

## 2018-11-01 ENCOUNTER — Ambulatory Visit: Payer: Medicare Other

## 2018-11-01 ENCOUNTER — Encounter: Payer: Self-pay | Admitting: Surgery

## 2018-11-01 ENCOUNTER — Other Ambulatory Visit: Payer: Self-pay

## 2018-11-01 ENCOUNTER — Ambulatory Visit (INDEPENDENT_AMBULATORY_CARE_PROVIDER_SITE_OTHER): Payer: Medicare Other | Admitting: Surgery

## 2018-11-01 DIAGNOSIS — M542 Cervicalgia: Secondary | ICD-10-CM | POA: Diagnosis not present

## 2018-11-01 DIAGNOSIS — M47812 Spondylosis without myelopathy or radiculopathy, cervical region: Secondary | ICD-10-CM

## 2018-11-01 DIAGNOSIS — M47816 Spondylosis without myelopathy or radiculopathy, lumbar region: Secondary | ICD-10-CM

## 2018-11-07 ENCOUNTER — Telehealth: Payer: Self-pay

## 2018-11-07 NOTE — Telephone Encounter (Signed)
Has patient seen PCP for this complaint?  YES *If NO, is insurance requiring patient see PCP for this issue before PCP can refer them? Referral for which specialty:  Ear Nose and Throat Preferred provider/office:  None Reason for referral:  Patient has ringing in his ears and a little dizziness

## 2018-11-07 NOTE — Telephone Encounter (Signed)
Pt is requesting a referral to ENT for dizziness and ringing in the ear. Pt was seen in May and is not feeling any better.   Okay to enter?

## 2018-11-08 NOTE — Telephone Encounter (Signed)
I think he should see his neurologist again

## 2018-11-11 ENCOUNTER — Other Ambulatory Visit: Payer: Self-pay

## 2018-11-11 NOTE — Telephone Encounter (Signed)
Patient informed. 

## 2018-11-11 NOTE — Telephone Encounter (Signed)
Left detailed message for pt informing of PCP recommendation to see neurology.

## 2018-11-11 NOTE — Patient Outreach (Signed)
[facility]) Care Management  11/11/2018  Antonio Adams 1946/03/22 [id number]   Medication Adherence call to [name] HIPPA Compliant Voice message left with a call back number.[facility] has both medication ready for patient to pick up [name] is showing past due on Janumet 50/1000 mg and Atorvastatin 40 mg under [place].   [facility] Direct Dial [phone]  Fax [phone] [name]

## 2018-11-12 DIAGNOSIS — Z85528 Personal history of other malignant neoplasm of kidney: Secondary | ICD-10-CM | POA: Diagnosis not present

## 2018-11-13 ENCOUNTER — Other Ambulatory Visit: Payer: Self-pay

## 2018-11-14 ENCOUNTER — Ambulatory Visit (HOSPITAL_COMMUNITY)
Admission: RE | Admit: 2018-11-14 | Discharge: 2018-11-14 | Disposition: A | Discharge status: Home / Self Care | Payer: Medicare Other | Source: Ambulatory Visit | Attending: Urology | Admitting: Urology

## 2018-11-14 ENCOUNTER — Encounter: Payer: Self-pay | Admitting: Internal Medicine

## 2018-11-14 ENCOUNTER — Other Ambulatory Visit (HOSPITAL_COMMUNITY): Payer: Self-pay | Admitting: Urology

## 2018-11-14 ENCOUNTER — Other Ambulatory Visit: Payer: Self-pay

## 2018-11-14 DIAGNOSIS — K8020 Calculus of gallbladder without cholecystitis without obstruction: Secondary | ICD-10-CM | POA: Diagnosis not present

## 2018-11-14 DIAGNOSIS — Z85528 Personal history of other malignant neoplasm of kidney: Secondary | ICD-10-CM

## 2018-11-14 DIAGNOSIS — C641 Malignant neoplasm of right kidney, except renal pelvis: Secondary | ICD-10-CM | POA: Diagnosis not present

## 2018-11-14 DIAGNOSIS — N281 Cyst of kidney, acquired: Secondary | ICD-10-CM | POA: Diagnosis not present

## 2018-11-26 ENCOUNTER — Encounter: Payer: Self-pay | Admitting: Internal Medicine

## 2018-12-13 ENCOUNTER — Other Ambulatory Visit: Payer: Self-pay

## 2018-12-13 ENCOUNTER — Ambulatory Visit: Payer: Medicare Other

## 2018-12-13 ENCOUNTER — Encounter: Payer: Self-pay | Admitting: Surgery

## 2018-12-13 ENCOUNTER — Ambulatory Visit (INDEPENDENT_AMBULATORY_CARE_PROVIDER_SITE_OTHER): Payer: Medicare Other | Admitting: Surgery

## 2018-12-13 DIAGNOSIS — M545 Low back pain, unspecified: Secondary | ICD-10-CM

## 2018-12-13 MED ORDER — METHOCARBAMOL 750 MG PO TABS: 750.0000 mg | ORAL_TABLET | Freq: Three times a day (TID) | ORAL | 0 refills | Status: DC | PRN

## 2018-12-13 MED ORDER — TRAMADOL HCL 50 MG PO TABS: 50.0000 mg | ORAL_TABLET | Freq: Two times a day (BID) | ORAL | 0 refills | Status: DC | PRN

## 2018-12-13 NOTE — Progress Notes (Deleted)
Office Visit Note   Patient: Antonio Adams           Date of Birth: 1946-03-16           MRN: [id number] Visit Date: 11/01/2018              Requested by: Antonio Lima, MD [address] [place],  [place] [address] PCP: Antonio Lima, MD   Assessment & Plan: Visit Diagnoses:  1. Neck pain   2. Spondylosis without myelopathy or radiculopathy, cervical region     Plan: Patient's neck is doing well at this point.  I advised him for his low back pain I do have somewhat of a dilemma since I cannot treat this with oral prednisone due to his diabetes and he also cannot take oral NSAIDs due to his kidney issues.  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  He understands that this will continue to aggravate his back along with his neck.  I gave him prescriptions for Ultram and Robaxin to see if this will help.  Follow-up with me in 3 weeks for recheck.  If he does not have any improvement I will repeat lumbar MRI scan and compared to the study that was done March 2019.  Follow-Up Instructions: Return in about 6 weeks (around 12/13/2018).   Orders:  Orders Placed This Encounter  Procedures  . XR Cervical Spine 2 or 3 views   No orders of the defined types were placed in this encounter.     Procedures: No procedures performed   Clinical Data: No additional findings.   Subjective: Chief Complaint  Patient presents with  . Neck - Pain    HPI 73 year old white male comes in with complaints of low back pain and also for recheck of his neck pain.  States that his neck is currently doing well.  No issues there.  Patient also has a known history of lumbar spondylosis and has had previous lumbar surgery with Dr. Louanne Adams several years ago.  He has had off-and-on pain for several years recently he was house aggravated it.  No lower extremity radicular symptoms.  Pain in the central lumbar spine.  Aggravated with  twisting and lifting.  Patient has history of kidney disease and is unable to take oral NSAIDs.    lReview of Systems No current cardiac pulmonary GI GU issues  Objective: Vital Signs: There were no vitals taken for this visit.  Physical Exam HENT:     Head: Normocephalic.     Nose: Nose normal.  Pulmonary:     Effort: No respiratory distress.  Musculoskeletal:     Comments: Patient has bilateral lumbar paraspinal tenderness.  No sciatic notch or SI joint tenderness.  Negative logroll.  Negative straight leg raise.  Neurovascular intact.  No focal motor deficits.  Skin:    General: Skin is warm and dry.  Neurological:     General: No focal deficit present.     Mental Status: He is alert and oriented to person, place, and time.     Ortho Exam  Specialty Comments:  No specialty comments available.  Imaging: No results found.   PMFS History: Patient Active Problem List   Diagnosis Date Noted  . Neck pain, bilateral 10/08/2018  . Palpitations 10/08/2018  . CKD (chronic kidney disease) stage 3, GFR 30-59 ml/min (HCC) 10/08/2018  . Murmur, cardiac 10/08/2018  . DDD (degenerative disc disease), cervical 10/08/2018  .  OAB (overactive bladder) 04/04/2018  . Seasonal allergic rhinitis due to pollen 11/01/2016  . Microalbuminuria due to type 2 diabetes mellitus ([facility]) 07/27/2016  . Renal cell carcinoma ([place]) 10/06/2015  . Varicose veins of both lower extremities 08/06/2015  . Varicose veins of lower extremities with ulcer ([place]) 06/16/2015  . Other constipation 08/03/2014  . Internal hemorrhoid, bleeding 08/03/2014  . Post traumatic stress disorder (PTSD) 01/29/2014  . Deficiency anemia 12/10/2013  . Radiation proctitis 06/04/2012  . Spinal stenosis of lumbar region with radiculopathy 10/30/2011    Class: Chronic  . OSA (obstructive sleep apnea) 11/24/2010  . Obesity, Class I, BMI 30-34.9 11/24/2010  . Routine general medical examination at a health care facility  11/24/2010  . ADENOCARCINOMA, PROSTATE, GLEASON GRADE 6 12/21/2009  . Type II diabetes mellitus with manifestations ([place]) 03/16/2007  . Hyperlipidemia with target LDL less than 100 03/16/2007  . Essential hypertension 03/16/2007  . GERD 03/16/2007   Past Medical History:  Diagnosis Date  . ADENOCARCINOMA, PROSTATE, GLEASON GRADE 6 12/21/2009   prostate cancer  . ALLERGIC RHINITIS 03/16/2007  . Allergy   . Anxiety   . Arthritis    back   . BACK PAIN WITH RADICULOPATHY 11/23/2008  . Cancer of kidney ([place])    partial right kidney removed  . CARPAL TUNNEL SYNDROME, BILATERAL 03/16/2007  . Chronic back pain   . Constipation    takes Senokot daily  . Depression    occasionally  . DIABETES MELLITUS, TYPE II 03/16/2007   takes Januvia and MEtformin daily  . GASTROINTESTINAL HEMORRHAGE, HX OF 03/16/2007  . GERD 03/16/2007   takes Omeprazole daily  . Glaucoma    mild - no eye drops  . Hemorrhoids   . History of colon polyps   . HYPERLIPIDEMIA 03/16/2007   takes Crestor daily  . HYPERTENSION 03/16/2007   takes Diltiazem and Lisinopril daily   . LEG CRAMPS 05/22/2007  . Nocturia   . Overactive bladder   . PARESTHESIA 05/21/2007  . Proctitis   . Rectal bleeding    AntonioNorins has explained its from the Radiation that he has received  . Rectal bleeding   . Sleep apnea    uses CPAP nightly  . Stomach cancer ([place])   . Urinary frequency    takes Toviaz daily    Family History  Problem Relation Age of Onset  . Cancer Sister 30       Cancer, unsure type  . Hypertension Other   . Colon cancer Neg Hx   . Esophageal cancer Neg Hx   . Stomach cancer Neg Hx   . Diabetes Neg Hx   . Colon polyps Neg Hx   . Rectal cancer Neg Hx     Past Surgical History:  Procedure Laterality Date  . [place], 2008   repeat surgery; [facility] '08  . CARPAL TUNNEL RELEASE     bilateral  . CERVICAL FUSION    . COLONOSCOPY    . colonosocpy    . ESOPHAGOGASTRODUODENOSCOPY     ED  . FLEXIBLE  SIGMOIDOSCOPY N/A 01/08/2017   Procedure: FLEXIBLE SIGMOIDOSCOPY;  Surgeon: Irene Shipper, MD;  Location[facility] ENDOSCOPY;  Service: Endoscopy;  Laterality: N/A;  . HOT HEMOSTASIS N/A 01/08/2017   Procedure: HOT HEMOSTASIS (ARGON PLASMA COAGULATION/BICAP);  Surgeon: Irene Shipper, MD;  Location: [facility] ENDOSCOPY;  Service: Endoscopy;  Laterality: N/A;  . LUMBAR LAMINECTOMY  10/30/2011   Procedure: MICRODISCECTOMY LUMBAR LAMINECTOMY;  Surgeon: Jessy Oto, MD;  Location:  [facility];  Service: Orthopedics;  Laterality: Right;  Right L4-5 and L5-S1 Microdiscectomy  . LUMBAR LAMINECTOMY  06/17/2012   Procedure: MICRODISCECTOMY LUMBAR LAMINECTOMY;  Surgeon: Jessy Oto, MD;  Location: [facility];  Service: Orthopedics;  Laterality: N/A;  Right L5-S1 microdiscectomy  . POLYPECTOMY    . PROSTATE SURGERY  march 2012   seed implant  . ROBOTIC ASSITED PARTIAL NEPHRECTOMY Right 09/08/2015   Procedure: XI ROBOTIC ASSITED RIGHT PARTIAL NEPHRECTOMY;  Surgeon: [name], MD;  Location: [facility];  Service: Urology;  Laterality: Right;  Clamp on: 1033 Clamp off: 1103 Total Clamp Time: 30 minutes  . SHOULDER ARTHROSCOPY WITH SUBACROMIAL DECOMPRESSION Left 01/20/2016   Procedure: LEFT SHOULDER ARTHROSCOPY WITH EXTENSIVE  DEBRIDEMENT, ACROMIOPLASTY;  Surgeon: Ninetta Lights, MD;  Location: [facility];  Service: Orthopedics;  Laterality: Left;  . SHOULDER SURGERY  LFT  . Stress Cardiolite  12/24/2003   negative for ischemia  . UPPER ESOPHAGEAL ENDOSCOPIC ULTRASOUND (EUS)  04/18/2016   [facility] hospital  . UPPER GASTROINTESTINAL ENDOSCOPY    . WOUND EXPLORATION     management of a wound that he sustained in the [place] History  . 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 Smoking status: Never Smoker  . Smokeless tobacco: Never Used  Substance and Sexual Activity  . Alcohol use: Yes    Alcohol/week:  0.0 standard drinks    Comment: occasional  . Drug use: No  . Sexual activity: Not Currently    Partners: Female

## 2018-12-26 ENCOUNTER — Encounter: Payer: Self-pay | Admitting: Neurology

## 2018-12-26 ENCOUNTER — Other Ambulatory Visit: Payer: Self-pay

## 2018-12-26 ENCOUNTER — Telehealth (INDEPENDENT_AMBULATORY_CARE_PROVIDER_SITE_OTHER): Payer: Medicare Other | Admitting: Neurology

## 2018-12-26 VITALS — Ht 71.0 in | Wt 205.0 lb

## 2018-12-26 DIAGNOSIS — G3184 Mild cognitive impairment, so stated: Secondary | ICD-10-CM

## 2018-12-26 NOTE — Patient Outreach (Signed)
[facility]) Care Management  12/26/2018  WALDEMAR SIEGEL 1945-12-01 [id number]  Medication Adherence call to Mr. Jader Desai Hippa Identifiers Verify spoke with patient he is past due on Janumet Xr 50/1000 mg patient explain he is taking 1 tablet daily and has medication at this time patient said he has a prescription ready at the pharmacy and plans to pick up soon.Mr. Mabey is showing past due under [place].   [facility] Management Direct Dial [phone]  Fax [phone] [email or web address]
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
## 2019-01-01 ENCOUNTER — Ambulatory Visit: Payer: Medicare Other | Admitting: Surgery

## 2019-01-01 ENCOUNTER — Other Ambulatory Visit: Payer: Self-pay | Admitting: Internal Medicine

## 2019-01-01 DIAGNOSIS — E118 Type 2 diabetes mellitus with unspecified complications: Secondary | ICD-10-CM

## 2019-01-01 DIAGNOSIS — R809 Proteinuria, unspecified: Secondary | ICD-10-CM

## 2019-01-01 DIAGNOSIS — I10 Essential (primary) hypertension: Secondary | ICD-10-CM

## 2019-01-01 DIAGNOSIS — E1129 Type 2 diabetes mellitus with other diabetic kidney complication: Secondary | ICD-10-CM

## 2019-01-09 ENCOUNTER — Ambulatory Visit: Payer: Medicare Other | Admitting: Surgery

## 2019-02-06 ENCOUNTER — Other Ambulatory Visit: Payer: Self-pay | Admitting: Internal Medicine

## 2019-02-06 DIAGNOSIS — E1129 Type 2 diabetes mellitus with other diabetic kidney complication: Secondary | ICD-10-CM

## 2019-02-06 DIAGNOSIS — I10 Essential (primary) hypertension: Secondary | ICD-10-CM

## 2019-02-06 DIAGNOSIS — E118 Type 2 diabetes mellitus with unspecified complications: Secondary | ICD-10-CM

## 2019-02-06 DIAGNOSIS — E785 Hyperlipidemia, unspecified: Secondary | ICD-10-CM

## 2019-02-24 ENCOUNTER — Other Ambulatory Visit: Payer: Self-pay

## 2019-02-24 DIAGNOSIS — D508 Other iron deficiency anemias: Secondary | ICD-10-CM

## 2019-02-24 MED ORDER — FERROUS SULFATE 325 (65 FE) MG PO TABS: 325.0000 mg | ORAL_TABLET | Freq: Two times a day (BID) | ORAL | 1 refills | Status: DC

## 2019-02-27 ENCOUNTER — Other Ambulatory Visit: Payer: Self-pay

## 2019-02-27 NOTE — Patient Outreach (Signed)
[facility]) Care Management  02/27/2019  EMMITT MATTHEWS 23-Jul-1945 [id number]   Medication Adherence call to [name] HIPPA Compliant Voice message left with a call back number. Mr. [name] is showing past due on Janumet 50/1000 mg under [place].   [place] [facility] Management Direct Dial [phone]  Fax [phone] [name]

## 2019-03-02 ENCOUNTER — Other Ambulatory Visit: Payer: Self-pay | Admitting: Internal Medicine

## 2019-03-23 ENCOUNTER — Other Ambulatory Visit: Payer: Self-pay

## 2019-03-23 ENCOUNTER — Encounter (HOSPITAL_COMMUNITY): Payer: Self-pay | Admitting: *Deleted

## 2019-03-23 ENCOUNTER — Emergency Department (HOSPITAL_COMMUNITY)
Admission: EM | Admit: 2019-03-23 | Discharge: 2019-03-23 | Disposition: A | Discharge status: Home / Self Care | Payer: Medicare Other | Attending: Emergency Medicine | Admitting: Emergency Medicine

## 2019-03-23 ENCOUNTER — Emergency Department (HOSPITAL_COMMUNITY): Payer: Medicare Other

## 2019-03-23 DIAGNOSIS — Z85528 Personal history of other malignant neoplasm of kidney: Secondary | ICD-10-CM | POA: Diagnosis not present

## 2019-03-23 DIAGNOSIS — Z79899 Other long term (current) drug therapy: Secondary | ICD-10-CM | POA: Diagnosis not present

## 2019-03-23 DIAGNOSIS — R079 Chest pain, unspecified: Secondary | ICD-10-CM | POA: Diagnosis not present

## 2019-03-23 DIAGNOSIS — N183 Chronic kidney disease, stage 3 unspecified: Secondary | ICD-10-CM | POA: Insufficient documentation

## 2019-03-23 DIAGNOSIS — I129 Hypertensive chronic kidney disease with stage 1 through stage 4 chronic kidney disease, or unspecified chronic kidney disease: Secondary | ICD-10-CM | POA: Diagnosis not present

## 2019-03-23 DIAGNOSIS — E1122 Type 2 diabetes mellitus with diabetic chronic kidney disease: Secondary | ICD-10-CM | POA: Insufficient documentation

## 2019-03-23 DIAGNOSIS — Z7982 Long term (current) use of aspirin: Secondary | ICD-10-CM | POA: Diagnosis not present

## 2019-03-23 DIAGNOSIS — R0789 Other chest pain: Secondary | ICD-10-CM

## 2019-03-23 DIAGNOSIS — Z794 Long term (current) use of insulin: Secondary | ICD-10-CM | POA: Insufficient documentation

## 2019-03-23 LAB — BASIC METABOLIC PANEL
Anion gap: 7 (ref 5–15)
BUN: 32 mg/dL — ABNORMAL HIGH (ref 8–23)
CO2: 22 mmol/L (ref 22–32)
Calcium: 10.2 mg/dL (ref 8.9–10.3)
Chloride: 107 mmol/L (ref 98–111)
Creatinine, Ser: 2.1 mg/dL — ABNORMAL HIGH (ref 0.61–1.24)
GFR calc Af Amer: 35 mL/min — ABNORMAL LOW (ref >60)
GFR calc non Af Amer: 31 mL/min — ABNORMAL LOW (ref >60)
Glucose, Bld: 80 mg/dL (ref 70–99)
Potassium: 4.8 mmol/L (ref 3.5–5.1)
Sodium: 136 mmol/L (ref 135–145)

## 2019-03-23 LAB — TROPONIN I (HIGH SENSITIVITY)
Troponin I (High Sensitivity): 10 ng/L (ref <18)
Troponin I (High Sensitivity): 9 ng/L (ref <18)

## 2019-03-23 LAB — CBC
HCT: 34.7 % — ABNORMAL LOW (ref 39.0–52.0)
Hemoglobin: 11.2 g/dL — ABNORMAL LOW (ref 13.0–17.0)
MCH: 29.4 pg (ref 26.0–34.0)
MCHC: 32.3 g/dL (ref 30.0–36.0)
MCV: 91.1 fL (ref 80.0–100.0)
Platelets: 203 10*3/uL (ref 150–400)
RBC: 3.81 MIL/uL — ABNORMAL LOW (ref 4.22–5.81)
RDW: 14.1 % (ref 11.5–15.5)
WBC: 3.8 10*3/uL — ABNORMAL LOW (ref 4.0–10.5)
nRBC: 0 % (ref 0.0–0.2)

## 2019-03-23 MED ORDER — SODIUM CHLORIDE 0.9% FLUSH: 3.0000 mL | Freq: Once | INTRAVENOUS | Status: DC

## 2019-03-23 MED ORDER — FAMOTIDINE 20 MG PO TABS: 20.0000 mg | ORAL_TABLET | Freq: Two times a day (BID) | ORAL | 0 refills | Status: DC

## 2019-03-23 NOTE — ED Provider Notes (Signed)
[facility] DEPT Provider Note   CSN: [id number] Arrival date & time: 03/23/19  1853     History   Chief Complaint Chief Complaint  Patient presents with  . Chest Pain    HPI Antonio Adams is a 73 y.o. male.     Pt presents to the ED today with CP.  He said it happened this morning and again this afternoon.  The pt said it felt like a "striking sensation."  He took an alka-seltzer this morning which helped sx.  After he had the pain this afternoon, he went to UC who told him to come here.  He does not have any pain now.  No sob.  No f/c.  No cough.  No known covid exposures.  No heart hx.  He does take a daily asa and took 1 today.     Past Medical History:  Diagnosis Date  . ADENOCARCINOMA, PROSTATE, GLEASON GRADE 6 12/21/2009   prostate cancer  . ALLERGIC RHINITIS 03/16/2007  . Allergy   . Anxiety   . Arthritis    back   . BACK PAIN WITH RADICULOPATHY 11/23/2008  . Cancer of kidney ([place])    partial right kidney removed  . CARPAL TUNNEL SYNDROME, BILATERAL 03/16/2007  . Chronic back pain   . Constipation    takes Senokot daily  . Depression    occasionally  . DIABETES MELLITUS, TYPE II 03/16/2007   takes Januvia and MEtformin daily  . GASTROINTESTINAL HEMORRHAGE, HX OF 03/16/2007  . GERD 03/16/2007   takes Omeprazole daily  . Glaucoma    mild - no eye drops  . Hemorrhoids   . History of colon polyps   . HYPERLIPIDEMIA 03/16/2007   takes Crestor daily  . HYPERTENSION 03/16/2007   takes Diltiazem and Lisinopril daily   . LEG CRAMPS 05/22/2007  . Nocturia   . Overactive bladder   . PARESTHESIA 05/21/2007  . Proctitis   . Rectal bleeding    Dr.Norins has explained its from the Radiation that he has received  . Rectal bleeding   . Sleep apnea    uses CPAP nightly  . Stomach cancer ([place])   . Urinary frequency    takes Toviaz daily    Patient Active Problem List   Diagnosis Date Noted  . Neck pain, bilateral 10/08/2018  .  Palpitations 10/08/2018  . CKD (chronic kidney disease) stage 3, GFR 30-59 ml/min 10/08/2018  . Murmur, cardiac 10/08/2018  . DDD (degenerative disc disease), cervical 10/08/2018  . OAB (overactive bladder) 04/04/2018  . Seasonal allergic rhinitis due to pollen 11/01/2016  . Microalbuminuria due to type 2 diabetes mellitus ([place]) 07/27/2016  . Renal cell carcinoma ([place]) 10/06/2015  . Varicose veins of both lower extremities 08/06/2015  . Varicose veins of lower extremities with ulcer ([place]) 06/16/2015  . Other constipation 08/03/2014  . Internal hemorrhoid, bleeding 08/03/2014  . Post traumatic stress disorder (PTSD) 01/29/2014  . Deficiency anemia 12/10/2013  . Radiation proctitis 06/04/2012  . Spinal stenosis of lumbar region with radiculopathy 10/30/2011    Class: Chronic  . OSA (obstructive sleep apnea) 11/24/2010  . Obesity, Class I, BMI 30-34.9 11/24/2010  . Routine general medical examination at a health care facility 11/24/2010  . ADENOCARCINOMA, PROSTATE, GLEASON GRADE 6 12/21/2009  . Type II diabetes mellitus with manifestations ([place]) 03/16/2007  . Hyperlipidemia with target LDL less than 100 03/16/2007  . Essential hypertension 03/16/2007  . GERD 03/16/2007    Past Surgical History:  Procedure  Laterality Date  . [place], 2008   repeat surgery; ESI '08  . CARPAL TUNNEL RELEASE     bilateral  . CERVICAL FUSION    . COLONOSCOPY    . colonosocpy    . ESOPHAGOGASTRODUODENOSCOPY     ED  . FLEXIBLE SIGMOIDOSCOPY N/A 01/08/2017   Procedure: FLEXIBLE SIGMOIDOSCOPY;  Surgeon: Irene Shipper, MD;  Location: [facility] ENDOSCOPY;  Service: Endoscopy;  Laterality: N/A;  . HOT HEMOSTASIS N/A 01/08/2017   Procedure: HOT HEMOSTASIS (ARGON PLASMA COAGULATION/BICAP);  Surgeon: Irene Shipper, MD;  Location: [facility] ENDOSCOPY;  Service: Endoscopy;  Laterality: N/A;  . LUMBAR LAMINECTOMY  10/30/2011   Procedure: MICRODISCECTOMY LUMBAR LAMINECTOMY;  Surgeon: Jessy Oto, MD;  Location: [facility];  Service: Orthopedics;  Laterality: Right;  Right L4-5 and L5-S1 Microdiscectomy  . LUMBAR LAMINECTOMY  06/17/2012   Procedure: MICRODISCECTOMY LUMBAR LAMINECTOMY;  Surgeon: Jessy Oto, MD;  Location: [facility];  Service: Orthopedics;  Laterality: N/A;  Right L5-S1 microdiscectomy  . POLYPECTOMY    . PROSTATE SURGERY  march 2012   seed implant  . ROBOTIC ASSITED PARTIAL NEPHRECTOMY Right 09/08/2015   Procedure: XI ROBOTIC ASSITED RIGHT PARTIAL NEPHRECTOMY;  Surgeon: [name], MD;  Location: [facility];  Service: Urology;  Laterality: Right;  Clamp on: 1033 Clamp off: 1103 Total Clamp Time: 30 minutes  . SHOULDER ARTHROSCOPY WITH SUBACROMIAL DECOMPRESSION Left 01/20/2016   Procedure: LEFT SHOULDER ARTHROSCOPY WITH EXTENSIVE  DEBRIDEMENT, ACROMIOPLASTY;  Surgeon: Ninetta Lights, MD;  Location: [facility];  Service: Orthopedics;  Laterality: Left;  . SHOULDER SURGERY  LFT  . Stress Cardiolite  12/24/2003   negative for ischemia  . UPPER ESOPHAGEAL ENDOSCOPIC ULTRASOUND (EUS)  04/18/2016   [facility] hospital  . UPPER GASTROINTESTINAL ENDOSCOPY    . WOUND EXPLORATION     management of a wound that he sustained in the [place] Medications    Prior to Admission medications   Medication Sig Start Date End Date Taking? Authorizing Provider  aspirin EC 81 MG tablet Take 81 mg by mouth daily.    [provider]  atorvastatin (LIPITOR) 40 MG tablet TAKE 1 TABLET BY MOUTH EVERY DAY 02/06/19   [name], MD  Azilsartan-Chlorthalidone (EDARBYCLOR) 40-12.5 MG TABS Take 1 tablet by mouth daily. 01/03/18   [name], MD  cholecalciferol (VITAMIN D) 1000 units tablet Take 1,000 Units by mouth daily.    [provider]  citalopram (CELEXA) 40 MG tablet Take 1 tablet by mouth daily. 11/16/16   [provider]  clotrimazole-betamethasone (LOTRISONE) cream APPLY 1 APPLICATION TOPICALLY 2 (TWO) TIMES DAILY. 07/21/18   [name], MD   diclofenac sodium (VOLTAREN) 1 % GEL Apply 2 g topically 4 (four) times daily. 08/19/18   Jessy Oto, MD  famotidine (PEPCID) 20 MG tablet Take 1 tablet (20 mg total) by mouth 2 (two) times daily. 03/23/19   Isla Pence, MD  ferrous sulfate 325 (65 FE) MG tablet Take 1 tablet (325 mg total) by mouth 2 (two) times daily. Patient needs office visit for further refills 02/24/19   Irene Shipper, MD  fluticasone [facility]) 50 MCG/ACT nasal spray Place 2 sprays into both nostrils daily. 09/27/18   Marrian Salvage, FNP  gabapentin (NEURONTIN) 300 MG capsule Take 1 capsule (300 mg total) by mouth at bedtime. 08/19/18   Jessy Oto, MD  hydrOXYzine (ATARAX/VISTARIL) 25 MG tablet  10/03/18  [provider]  Insulin Pen Needle (NOVOFINE) 32G X 6 MM MISC 1 Act by Does not apply route daily. USE TID 11/01/16   [name], MD  JANUMET XR 50-1000 MG TB24 TAKE 1 TABLET BY MOUTH EVERY DAY 03/03/19   [name], MD  LANTUS SOLOSTAR 100 UNIT/ML Solostar Pen INJECT 50 UNITS INTO THE SKIN EVERY MORNING. 11/18/17   [name], MD  meclizine (ANTIVERT) 12.5 MG tablet Take 1 tablet (12.5 mg total) by mouth 3 (three) times daily as needed for dizziness. 09/27/18   Marrian Salvage, FNP  methocarbamol (ROBAXIN) 750 MG tablet Take 1 tablet (750 mg total) by mouth every 8 (eight) hours as needed for muscle spasms. 12/13/18   Lanae Crumbly, PA-C  mirabegron ER (MYRBETRIQ) 25 MG TB24 tablet Take 1 tablet (25 mg total) by mouth daily. 04/04/18   [name], MD  Needles & Syringes MISC Use syringe and needle to inject insulin into the skin as directed on insulin prescription. DX E11.09 11/04/14   [name], MD  NOVOFINE PLUS 32G X 4 MM MISC USE DAILY WITH INSULIN 06/27/16   [name], MD  omeprazole (PRILOSEC) 40 MG capsule TAKE ONE CAPSULE EVERY DAY 02/24/13   Norins, Heinz Knuckles, MD  ONE TOUCH ULTRA TEST test strip USE 2 (TWO) TIMES DAILY. AND LANCETS 2/DAY 250.03 11/05/16   [name], MD  tamsulosin (FLOMAX) 0.4 MG CAPS capsule TAKE 1 CAPSULE (0.4 MG TOTAL) BY MOUTH DAILY. 01/20/14   [name], MD  telmisartan (MICARDIS) 40 MG tablet TAKE 1 TABLET BY MOUTH EVERY DAY 02/06/19   [name], MD  traMADol (ULTRAM) 50 MG tablet Take 1 tablet (50 mg total) by mouth every 12 (twelve) hours as needed. 12/13/18   Lanae Crumbly, PA-C    Family History Family History  Problem Relation Age of Onset  . Cancer Sister 30       Cancer, unsure type  . Hypertension Other   . Colon cancer Neg Hx   . Esophageal cancer Neg Hx   . Stomach cancer Neg Hx   . Diabetes Neg Hx   . Colon polyps Neg Hx   . Rectal cancer Neg Hx     Social History Social History   Tobacco Use  . Smoking status: Never Smoker  . Smokeless tobacco: Never Used  Substance Use Topics  . Alcohol use: Not Currently    Alcohol/week: 0.0 standard drinks    Comment: occasional  . Drug use: No     Allergies   Ace inhibitors   Review of Systems Review of Systems  Cardiovascular: Positive for chest pain.  All other systems reviewed and are negative.    Physical Exam Updated Vital Signs BP 132/72 (BP Location: Left Arm) Comment: Simultaneous filing. User may not have seen previous data.  Pulse 73   Temp 98.5 F (36.9 C) (Oral)   Resp 13 Comment: Simultaneous filing. User may not have seen previous data.  Ht 5\' 11"  (1.803 m)   Wt 99.8 kg   SpO2 100%   BMI 30.68 kg/m   Physical Exam Vitals signs and nursing note reviewed.  Constitutional:      Appearance: He is well-developed.  HENT:     Head: Normocephalic and atraumatic.  Eyes:     Extraocular Movements: Extraocular movements intact.     Pupils: Pupils are equal, round, and reactive to light.  Neck:     Musculoskeletal:  Normal range of motion and neck supple.  Cardiovascular:     Rate and Rhythm: Normal rate and regular rhythm.     Heart sounds: Normal heart sounds.  Pulmonary:     Effort: Pulmonary effort is normal.      Breath sounds: Normal breath sounds.  Abdominal:     General: Bowel sounds are normal.     Palpations: Abdomen is soft.  Musculoskeletal: Normal range of motion.  Skin:    General: Skin is warm.     Capillary Refill: Capillary refill takes less than 2 seconds.  Neurological:     General: No focal deficit present.     Mental Status: He is alert and oriented to person, place, and time.  Psychiatric:        Mood and Affect: Mood normal.        Behavior: Behavior normal.      ED Treatments / Results  Labs (all labs ordered are listed, but only abnormal results are displayed) Labs Reviewed  BASIC METABOLIC PANEL - Abnormal; Notable for the following components:      Result Value   BUN 32 (*)    Creatinine, Ser 2.10 (*)    GFR calc non Af Amer 31 (*)    GFR calc Af Amer 35 (*)    All other components within normal limits  CBC - Abnormal; Notable for the following components:   WBC 3.8 (*)    RBC 3.81 (*)    Hemoglobin 11.2 (*)    HCT 34.7 (*)    All other components within normal limits  TROPONIN I (HIGH SENSITIVITY)  TROPONIN I (HIGH SENSITIVITY)    EKG EKG Interpretation  Date/Time:  Sunday March 23 2019 19:01:42 EST Ventricular Rate:  61 PR Interval:    QRS Duration: 90 QT Interval:  371 QTC Calculation: 374 R Axis:   31 Text Interpretation: Sinus rhythm Prolonged PR interval No significant change since last tracing Confirmed by Isla Pence [phone]) on 03/23/2019 7:46:23 PM   Radiology Dg Chest 2 View  Result Date: 03/23/2019 CLINICAL DATA:  Chest pain for several hours EXAM: CHEST - 2 VIEW COMPARISON:  11/14/2018 FINDINGS: Cardiac shadow is stable. Thoracic aorta is within normal limits. The lungs are well aerated bilaterally. No focal infiltrate or sizable effusion is seen. Degenerative changes of the thoracic spine are noted. Postoperative changes in the cervical spine is seen as well. IMPRESSION: No active cardiopulmonary disease. Electronically Signed    By: Inez Catalina M.D.   On: 03/23/2019 19:45    Procedures Procedures (including critical care time)  Medications Ordered in ED Medications  sodium chloride flush (NS) 0.9 % injection 3 mL (0 mLs Intravenous Hold 03/23/19 2016)     Initial Impression / Assessment and Plan / ED Course  I have reviewed the triage vital signs and the nursing notes.  Pertinent labs & imaging results that were available during my care of the patient were reviewed by me and considered in my medical decision making (see chart for details).       2nd troponin negative.  EKG nl.  History not typical for CAD.  Pt is stable for d/c.  He knows to return if sx worsen.  F/u with pcp.  Final Clinical Impressions(s) / ED Diagnoses   Final diagnoses:  Atypical chest pain    ED Discharge Orders         Ordered    famotidine (PEPCID) 20 MG tablet  2 times daily  03/23/19 2246           Isla Pence, MD 03/23/19 2247

## 2019-03-23 NOTE — ED Notes (Signed)
Pt verbalizes understanding of DC instructions. Pt belongings returned and is ambulatory out of ED.  

## 2019-03-23 NOTE — ED Triage Notes (Signed)
Pt states he started having Left side chest pain this morning, eased off and has returned, 2 events today, no history

## 2019-03-25 ENCOUNTER — Other Ambulatory Visit: Payer: Self-pay | Admitting: Internal Medicine

## 2019-03-25 DIAGNOSIS — D508 Other iron deficiency anemias: Secondary | ICD-10-CM

## 2019-03-31 ENCOUNTER — Other Ambulatory Visit: Payer: Self-pay | Admitting: Internal Medicine

## 2019-04-02 ENCOUNTER — Other Ambulatory Visit (INDEPENDENT_AMBULATORY_CARE_PROVIDER_SITE_OTHER): Payer: Medicare Other

## 2019-04-02 ENCOUNTER — Other Ambulatory Visit: Payer: Self-pay

## 2019-04-02 ENCOUNTER — Other Ambulatory Visit: Payer: Self-pay | Admitting: Internal Medicine

## 2019-04-02 ENCOUNTER — Ambulatory Visit (INDEPENDENT_AMBULATORY_CARE_PROVIDER_SITE_OTHER): Payer: Medicare Other | Admitting: Internal Medicine

## 2019-04-02 ENCOUNTER — Encounter: Payer: Self-pay | Admitting: Internal Medicine

## 2019-04-02 VITALS — BP 130/72 | HR 75 | Temp 98.5°F | Ht 71.0 in | Wt 216.0 lb

## 2019-04-02 DIAGNOSIS — Z0000 Encounter for general adult medical examination without abnormal findings: Secondary | ICD-10-CM

## 2019-04-02 DIAGNOSIS — C61 Malignant neoplasm of prostate: Secondary | ICD-10-CM

## 2019-04-02 DIAGNOSIS — D539 Nutritional anemia, unspecified: Secondary | ICD-10-CM | POA: Diagnosis not present

## 2019-04-02 DIAGNOSIS — R809 Proteinuria, unspecified: Secondary | ICD-10-CM

## 2019-04-02 DIAGNOSIS — E785 Hyperlipidemia, unspecified: Secondary | ICD-10-CM

## 2019-04-02 DIAGNOSIS — Z23 Encounter for immunization: Secondary | ICD-10-CM

## 2019-04-02 DIAGNOSIS — N1832 Chronic kidney disease, stage 3b: Secondary | ICD-10-CM

## 2019-04-02 DIAGNOSIS — E118 Type 2 diabetes mellitus with unspecified complications: Secondary | ICD-10-CM | POA: Diagnosis not present

## 2019-04-02 DIAGNOSIS — I10 Essential (primary) hypertension: Secondary | ICD-10-CM

## 2019-04-02 DIAGNOSIS — E1129 Type 2 diabetes mellitus with other diabetic kidney complication: Secondary | ICD-10-CM | POA: Diagnosis not present

## 2019-04-02 LAB — URINALYSIS, ROUTINE W REFLEX MICROSCOPIC
Bilirubin Urine: NEGATIVE
Hgb urine dipstick: NEGATIVE
Ketones, ur: NEGATIVE
Leukocytes,Ua: NEGATIVE
Nitrite: NEGATIVE
RBC / HPF: NONE SEEN (ref 0–?)
Specific Gravity, Urine: 1.025 (ref 1.000–1.030)
Total Protein, Urine: 100 — AB
Urine Glucose: NEGATIVE
Urobilinogen, UA: 0.2 (ref 0.0–1.0)
pH: 6 (ref 5.0–8.0)

## 2019-04-02 LAB — CBC WITH DIFFERENTIAL/PLATELET
Basophils Absolute: 0 10*3/uL (ref 0.0–0.1)
Basophils Relative: 1.1 % (ref 0.0–3.0)
Eosinophils Absolute: 0.1 10*3/uL (ref 0.0–0.7)
Eosinophils Relative: 3.6 % (ref 0.0–5.0)
HCT: 31.1 % — ABNORMAL LOW (ref 39.0–52.0)
Hemoglobin: 10.8 g/dL — ABNORMAL LOW (ref 13.0–17.0)
Lymphocytes Relative: 23.8 % (ref 12.0–46.0)
Lymphs Abs: 0.9 10*3/uL (ref 0.7–4.0)
MCHC: 34.7 g/dL (ref 30.0–36.0)
MCV: 87.2 fl (ref 78.0–100.0)
Monocytes Absolute: 0.4 10*3/uL (ref 0.1–1.0)
Monocytes Relative: 10.9 % (ref 3.0–12.0)
Neutro Abs: 2.3 10*3/uL (ref 1.4–7.7)
Neutrophils Relative %: 60.6 % (ref 43.0–77.0)
Platelets: 205 10*3/uL (ref 150.0–400.0)
RBC: 3.57 Mil/uL — ABNORMAL LOW (ref 4.22–5.81)
RDW: 14.4 % (ref 11.5–15.5)
WBC: 3.8 10*3/uL — ABNORMAL LOW (ref 4.0–10.5)

## 2019-04-02 LAB — FERRITIN: Ferritin: 53.2 ng/mL (ref 22.0–322.0)

## 2019-04-02 LAB — BASIC METABOLIC PANEL
BUN: 29 mg/dL — ABNORMAL HIGH (ref 6–23)
CO2: 24 mEq/L (ref 19–32)
Calcium: 9.8 mg/dL (ref 8.4–10.5)
Chloride: 106 mEq/L (ref 96–112)
Creatinine, Ser: 2.23 mg/dL — ABNORMAL HIGH (ref 0.40–1.50)
GFR: 35.13 mL/min — ABNORMAL LOW (ref >60.00)
Glucose, Bld: 176 mg/dL — ABNORMAL HIGH (ref 70–99)
Potassium: 4.5 mEq/L (ref 3.5–5.1)
Sodium: 140 mEq/L (ref 135–145)

## 2019-04-02 LAB — VITAMIN B12: Vitamin B-12: 1078 pg/mL — ABNORMAL HIGH (ref 211–911)

## 2019-04-02 LAB — LIPID PANEL
Cholesterol: 150 mg/dL (ref 0–200)
HDL: 35.7 mg/dL — ABNORMAL LOW (ref >39.00)
LDL Cholesterol: 99 mg/dL (ref 0–99)
NonHDL: 114.48
Total CHOL/HDL Ratio: 4
Triglycerides: 78 mg/dL (ref 0.0–149.0)
VLDL: 15.6 mg/dL (ref 0.0–40.0)

## 2019-04-02 LAB — FOLATE: Folate: >24.1 ng/mL (ref >5.9)

## 2019-04-02 LAB — IBC PANEL
Iron: 73 ug/dL (ref 42–165)
Saturation Ratios: 20.3 % (ref 20.0–50.0)
Transferrin: 257 mg/dL (ref 212.0–360.0)

## 2019-04-02 LAB — HEMOGLOBIN A1C: Hgb A1c MFr Bld: 7.1 % — ABNORMAL HIGH (ref 4.6–6.5)

## 2019-04-02 LAB — TSH: TSH: 1.82 u[IU]/mL (ref 0.35–4.50)

## 2019-04-02 LAB — PSA: PSA: 0.02 ng/mL — ABNORMAL LOW (ref 0.10–4.00)

## 2019-04-02 NOTE — Progress Notes (Signed)
Subjective:  Patient ID: Antonio Adams, male    DOB: 02/28/1946  Age: 73 y.o. MRN: [id number]  CC: Annual Exam, Anemia, Diabetes, Hyperlipidemia, and Hypertension   HPI Antonio Adams presents for a CPX.  He tells me his blood sugars have been well controlled and he denies polys.  He complains of weight gain.  He rarely notices blood in the stool and on the toilet paper.  He denies abdominal pain, loss of appetite, nausea, vomiting, or melena.  He is active and denies any recent episodes of CP, DOE, palpitations, edema, or fatigue.  Outpatient Medications Prior to Visit  Medication Sig Dispense Refill  . aspirin EC 81 MG tablet Take 81 mg by mouth daily.    Marland Kitchen atorvastatin (LIPITOR) 40 MG tablet TAKE 1 TABLET BY MOUTH EVERY DAY 90 tablet 1  . Azilsartan-Chlorthalidone (EDARBYCLOR) 40-12.5 MG TABS Take 1 tablet by mouth daily. 90 tablet 1  . cholecalciferol (VITAMIN D) 1000 units tablet Take 1,000 Units by mouth daily.    . citalopram (CELEXA) 40 MG tablet Take 1 tablet by mouth daily.    . clotrimazole-betamethasone (LOTRISONE) cream APPLY TO AFFECTED AREA TWICE A DAY 30 g 0  . diclofenac sodium (VOLTAREN) 1 % GEL Apply 2 g topically 4 (four) times daily. 2 Tube 0  . famotidine (PEPCID) 20 MG tablet Take 1 tablet (20 mg total) by mouth 2 (two) times daily. 30 tablet 0  . ferrous sulfate 325 (65 FE) MG tablet Take 1 tablet (325 mg total) by mouth 2 (two) times daily. Patient needs office visit for further refills 60 tablet 1  . fluticasone (FLONASE) 50 MCG/ACT nasal spray Place 2 sprays into both nostrils daily. 16 g 6  . gabapentin (NEURONTIN) 300 MG capsule Take 1 capsule (300 mg total) by mouth at bedtime. 90 capsule 3  . hydrOXYzine (ATARAX/VISTARIL) 25 MG tablet     . Insulin Pen Needle (NOVOFINE) 32G X 6 MM MISC 1 Act by Does not apply route daily. USE TID 100 each 11  . JANUMET XR 50-1000 MG TB24 TAKE 1 TABLET BY MOUTH EVERY DAY 90 tablet 0  . LANTUS SOLOSTAR 100  UNIT/ML Solostar Pen INJECT 50 UNITS INTO THE SKIN EVERY MORNING. 3 mL 1  . meclizine (ANTIVERT) 12.5 MG tablet Take 1 tablet (12.5 mg total) by mouth 3 (three) times daily as needed for dizziness. 30 tablet 0  . mirabegron ER (MYRBETRIQ) 25 MG TB24 tablet Take 1 tablet (25 mg total) by mouth daily. 90 tablet 1  . Needles & Syringes MISC Use syringe and needle to inject insulin into the skin as directed on insulin prescription. DX E11.09 90 each 3  . NOVOFINE PLUS 32G X 4 MM MISC USE DAILY WITH INSULIN 100 each 2  . omeprazole (PRILOSEC) 40 MG capsule TAKE ONE CAPSULE EVERY DAY 30 capsule 10  . ONE TOUCH ULTRA TEST test strip USE 2 (TWO) TIMES DAILY. AND LANCETS 2/DAY 250.03 100 each 11  . tamsulosin (FLOMAX) 0.4 MG CAPS capsule TAKE 1 CAPSULE (0.4 MG TOTAL) BY MOUTH DAILY. 30 capsule 1  . telmisartan (MICARDIS) 40 MG tablet TAKE 1 TABLET BY MOUTH EVERY DAY 90 tablet 0  . traMADol (ULTRAM) 50 MG tablet Take 1 tablet (50 mg total) by mouth every 12 (twelve) hours as needed. 30 tablet 0  . methocarbamol (ROBAXIN) 750 MG tablet Take 1 tablet (750 mg total) by mouth every 8 (eight) hours as needed for muscle spasms. 40 tablet 0  No facility-administered medications prior to visit.     ROS Review of Systems  Constitutional: Positive for unexpected weight change. Negative for appetite change, diaphoresis and fatigue.  HENT: Negative.   Eyes: Negative for visual disturbance.  Respiratory: Negative for cough, chest tightness, shortness of breath and wheezing.   Cardiovascular: Negative for chest pain, palpitations and leg swelling.  Gastrointestinal: Positive for anal bleeding and blood in stool. Negative for abdominal pain, constipation, diarrhea, nausea, rectal pain and vomiting.  Endocrine: Negative.   Genitourinary: Negative.  Negative for difficulty urinating, hematuria, scrotal swelling, testicular pain and urgency.  Musculoskeletal: Negative.  Negative for arthralgias and myalgias.  Skin:  Negative.  Negative for color change and pallor.  Neurological: Negative.  Negative for dizziness, weakness, light-headedness and headaches.  Hematological: Negative for adenopathy. Does not bruise/bleed easily.  Psychiatric/Behavioral: Negative.     Objective:  BP 130/72 (BP Location: Left Arm, Patient Position: Sitting, Cuff Size: Large)   Pulse 75   Temp 98.5 F (36.9 C) (Oral)   Ht 5\' 11"  (1.803 m)   Wt 216 lb (98 kg)   SpO2 99%   BMI 30.13 kg/m   BP Readings from Last 3 Encounters:  04/02/19 130/72  03/23/19 132/72  10/08/18 134/60    Wt Readings from Last 3 Encounters:  04/02/19 216 lb (98 kg)  03/23/19 220 lb (99.8 kg)  12/26/18 205 lb (93 kg)    Physical Exam Vitals signs reviewed.  Constitutional:      Appearance: Normal appearance.  HENT:     Nose: Nose normal.     Mouth/Throat:     Mouth: Mucous membranes are moist.  Eyes:     General: No scleral icterus.    Conjunctiva/sclera: Conjunctivae normal.  Neck:     Musculoskeletal: Neck supple.  Cardiovascular:     Rate and Rhythm: Normal rate and regular rhythm.     Heart sounds: No murmur.  Pulmonary:     Effort: Pulmonary effort is normal.     Breath sounds: No stridor. No wheezing, rhonchi or rales.  Abdominal:     General: Abdomen is flat. Bowel sounds are normal. There is no distension.     Palpations: Abdomen is soft. There is no hepatomegaly or splenomegaly.     Tenderness: There is no abdominal tenderness.     Hernia: There is no hernia in the left inguinal area or right inguinal area.  Genitourinary:    Pubic Area: No rash.      Penis: Normal. No discharge, swelling or lesions.      Scrotum/Testes: Normal.        Right: Mass or tenderness not present.        Left: Mass or tenderness not present.     Epididymis:     Right: Normal.     Left: Normal.     Prostate: Normal. Not enlarged, not tender and no nodules present.     Rectum: Guaiac result positive. External hemorrhoid and internal  hemorrhoid present. No mass, tenderness or anal fissure. Normal anal tone.  Musculoskeletal: Normal range of motion.     Right lower leg: No edema.     Left lower leg: No edema.  Lymphadenopathy:     Cervical: No cervical adenopathy.     Lower Body: No right inguinal adenopathy. No left inguinal adenopathy.  Skin:    General: Skin is warm and dry.     Coloration: Skin is not pale.  Neurological:     General: No focal deficit  present.     Mental Status: He is alert.  Psychiatric:        Mood and Affect: Mood normal.        Behavior: Behavior normal.     Lab Results  Component Value Date   WBC 3.8 (L) 04/02/2019   HGB 10.8 (L) 04/02/2019   HCT 31.1 (L) 04/02/2019   PLT 205.0 04/02/2019   GLUCOSE 176 (H) 04/02/2019   CHOL 150 04/02/2019   TRIG 78.0 04/02/2019   HDL 35.70 (L) 04/02/2019   LDLCALC 99 04/02/2019   ALT 16 09/27/2018   AST 16 09/27/2018   NA 140 04/02/2019   K 4.5 04/02/2019   CL 106 04/02/2019   CREATININE 2.23 (H) 04/02/2019   BUN 29 (H) 04/02/2019   CO2 24 04/02/2019   TSH 1.82 04/02/2019   PSA 0.02 (L) 04/02/2019   INR 1.02 07/14/2010   HGBA1C 7.1 (H) 04/02/2019   MICROALBUR 28.5 (H) 01/03/2018    Dg Chest 2 View  Result Date: 03/23/2019 CLINICAL DATA:  Chest pain for several hours EXAM: CHEST - 2 VIEW COMPARISON:  11/14/2018 FINDINGS: Cardiac shadow is stable. Thoracic aorta is within normal limits. The lungs are well aerated bilaterally. No focal infiltrate or sizable effusion is seen. Degenerative changes of the thoracic spine are noted. Postoperative changes in the cervical spine is seen as well. IMPRESSION: No active cardiopulmonary disease. Electronically Signed   By: Inez Catalina M.D.   On: 03/23/2019 19:45    Assessment & Plan:   Kallen was seen today for annual exam, anemia, diabetes, hyperlipidemia and hypertension.  Diagnoses and all orders for this visit:  Essential hypertension- His blood pressure is adequately well controlled. -      Basic metabolic panel; Future -     TSH; Future -     Urinalysis, Routine w reflex microscopic; Future  Type II diabetes mellitus with manifestations ([place])- His A1c is at 7.1%.  He has achieved adequate glycemic control. -     Basic metabolic panel; Future -     Hemoglobin A1c; Future -     HM Diabetes Foot Exam -     Ambulatory referral to Ophthalmology  Microalbuminuria due to type 2 diabetes mellitus ([place])- Will continue to try to maintain tight control of blood pressure and blood sugar.  ADENOCARCINOMA, PROSTATE, GLEASON GRADE 6- His PSA is at 0.02.  This is reassuring that there is no recurrence of prostate cancer. -     PSA; Future  Deficiency anemia- His H&H remain low but stable.  I will monitor him for vitamin deficiencies.  This is likely the anemia of chronic disease and renal insufficiency. -     CBC with Differential; Future -     B12; Future -     IBC panel; Future -     Reticulocytes; Future -     Vitamin B1; Future -     Folate; Future -     Ferritin; Future  Hyperlipidemia with target LDL less than 100- He has achieved his LDL goal and is doing well on the statin. -     Lipid panel; Future -     TSH; Future  Routine general medical examination at a health care facility- Exam completed, labs reviewed, vaccines reviewed and updated, cancer screenings are all up-to-date, patient education material was given.  Need for influenza vaccination -     Flu Vaccine QUAD High Dose(Fluad)  Stage 3b chronic kidney disease- His renal function is stable.  He will avoid nephrotoxic agents.   I am having Antonio Adams. Antonio Adams maintain his omeprazole, tamsulosin, Needles & Syringes, aspirin EC, cholecalciferol, NovoFine Plus, Insulin Pen Needle, ONE TOUCH ULTRA TEST, citalopram, Lantus SoloStar, Azilsartan-Chlorthalidone, mirabegron ER, diclofenac sodium, gabapentin, fluticasone, meclizine, hydrOXYzine, traMADol, atorvastatin, telmisartan, ferrous sulfate, Janumet XR, famotidine, and  clotrimazole-betamethasone.  No orders of the defined types were placed in this encounter.    Follow-up: Return in about 6 months (around 09/30/2019).  Scarlette Calico, MD

## 2019-04-02 NOTE — Patient Instructions (Signed)

## 2019-04-02 NOTE — Assessment & Plan Note (Signed)
Estimated Creatinine Clearance: 35.7 mL/min (A) (by C-G formula based on SCr of 2.23 mg/dL (H)).

## 2019-04-03 ENCOUNTER — Other Ambulatory Visit: Payer: Self-pay | Admitting: Internal Medicine

## 2019-04-05 LAB — RETICULOCYTES
ABS Retic: 39270 cells/uL (ref 25000–9000)
Retic Ct Pct: 1.1 %

## 2019-04-05 LAB — VITAMIN B1: Vitamin B1 (Thiamine): 19 nmol/L (ref 8–30)

## 2019-04-07 ENCOUNTER — Encounter: Payer: Self-pay | Admitting: Internal Medicine

## 2019-04-19 ENCOUNTER — Other Ambulatory Visit: Payer: Self-pay | Admitting: Internal Medicine

## 2019-04-19 DIAGNOSIS — Z794 Long term (current) use of insulin: Secondary | ICD-10-CM

## 2019-04-19 DIAGNOSIS — E118 Type 2 diabetes mellitus with unspecified complications: Secondary | ICD-10-CM

## 2019-04-21 ENCOUNTER — Other Ambulatory Visit: Payer: Self-pay

## 2019-04-21 NOTE — Patient Outreach (Signed)
[facility]) Care Management  04/21/2019  Antonio Adams 05/25/1945 [id number]   Medication Adherence call to Antonio Adams [facility] Identifiers Verify spoke with patient he is past due on Telmisartan 40 mg,patient explain he takes 1 tablet daily,patient ask to call [facility] to order this [place] will have it ready for patient to pick up. [name] is showing past due under [place].   [facility] Management Direct Dial [phone]  Fax [phone] [email or web address]

## 2019-05-13 MED ORDER — VANCOMYCIN HCL 1000 MG IV SOLR
INTRAVENOUS | Status: AC
  Filled 2019-05-13: qty 2000

## 2019-05-13 MED ORDER — KETOROLAC TROMETHAMINE 30 MG/ML IJ SOLN
INTRAMUSCULAR | Status: AC
  Filled 2019-05-13: qty 1

## 2019-05-13 MED ORDER — BUPIVACAINE HCL (PF) 0.25 % IJ SOLN
INTRAMUSCULAR | Status: AC
  Filled 2019-05-13: qty 30

## 2019-05-13 MED ORDER — SODIUM CHLORIDE (PF) 0.9 % IJ SOLN
INTRAMUSCULAR | Status: AC
  Filled 2019-05-13: qty 50

## 2019-05-13 MED ORDER — TOBRAMYCIN SULFATE 1.2 G IJ SOLR
INTRAMUSCULAR | Status: AC
  Filled 2019-05-13: qty 1.2

## 2019-05-24 ENCOUNTER — Other Ambulatory Visit: Payer: Self-pay | Admitting: Internal Medicine

## 2019-05-29 ENCOUNTER — Encounter: Payer: Self-pay | Admitting: Neurology

## 2019-05-29 ENCOUNTER — Other Ambulatory Visit: Payer: Self-pay | Admitting: Internal Medicine

## 2019-05-29 DIAGNOSIS — D508 Other iron deficiency anemias: Secondary | ICD-10-CM

## 2019-06-04 ENCOUNTER — Encounter: Payer: Self-pay | Admitting: Internal Medicine

## 2019-06-12 ENCOUNTER — Telehealth: Payer: Self-pay

## 2019-06-12 DIAGNOSIS — H9319 Tinnitus, unspecified ear: Secondary | ICD-10-CM

## 2019-06-12 NOTE — Telephone Encounter (Signed)
Referral entered  

## 2019-06-12 NOTE — Telephone Encounter (Signed)
New message    1. Referral to ENT - due to ringing in the left ear.

## 2019-06-12 NOTE — Telephone Encounter (Signed)
No OV needed, but they will need me to send them a referral.  Can you please put one in. He'll be going to [facility]  thanks

## 2019-06-23 ENCOUNTER — Other Ambulatory Visit: Payer: Self-pay | Admitting: Internal Medicine

## 2019-06-23 DIAGNOSIS — D508 Other iron deficiency anemias: Secondary | ICD-10-CM

## 2019-06-24 ENCOUNTER — Ambulatory Visit: Payer: Medicare Other | Admitting: Specialist

## 2019-06-24 ENCOUNTER — Encounter: Payer: Self-pay | Admitting: Specialist

## 2019-06-24 ENCOUNTER — Other Ambulatory Visit: Payer: Self-pay

## 2019-06-24 ENCOUNTER — Ambulatory Visit: Payer: Self-pay

## 2019-06-24 VITALS — BP 122/72 | HR 75 | Ht 71.0 in | Wt 216.0 lb

## 2019-06-24 DIAGNOSIS — M19021 Primary osteoarthritis, right elbow: Secondary | ICD-10-CM

## 2019-06-24 DIAGNOSIS — M25562 Pain in left knee: Secondary | ICD-10-CM | POA: Diagnosis not present

## 2019-06-24 DIAGNOSIS — M25521 Pain in right elbow: Secondary | ICD-10-CM | POA: Diagnosis not present

## 2019-06-24 DIAGNOSIS — M1712 Unilateral primary osteoarthritis, left knee: Secondary | ICD-10-CM

## 2019-06-24 MED ORDER — DICLOFENAC SODIUM 1 % EX GEL: 4.0000 g | Freq: Four times a day (QID) | CUTANEOUS | 2 refills | Status: DC

## 2019-06-24 NOTE — Patient Instructions (Addendum)
Plan: Knee is suffering from osteoarthritis, only real proven treatments are Weight loss,  You can not take oral NSIADs like diclofenac and exercise. I recommend transdermal voltaren gel. Well padded shoes help. Ice the knee that is suffering from osteoarthritis, only real proven treatments are Weight loss,and exercise. Well padded shoes help. Ice the knee 2-3 times a day 15-20 mins at a time.-3 times a day 15-20 mins at a time. Hot showers in the AM.  Injection with steroid may be of benefit. Hemp CBD capsules, amazon.com 5,000-7,000 mg per bottle, 60 capsules per bottle, take one capsule twice a day. Cane in the left hand to use with left leg weight bearing. Will send to PT for work on strengthening the periarticular muscles of the elbow and the knee. Follow-Up Instructions: No follow-ups on file.
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
## 2019-07-01 ENCOUNTER — Other Ambulatory Visit: Payer: Self-pay | Admitting: Internal Medicine

## 2019-07-01 DIAGNOSIS — E118 Type 2 diabetes mellitus with unspecified complications: Secondary | ICD-10-CM

## 2019-07-01 MED ORDER — FREESTYLE LIBRE 14 DAY SENSOR MISC: 1.0000 | Freq: Every day | 9 refills | Status: DC

## 2019-07-01 MED ORDER — FREESTYLE LIBRE 14 DAY READER DEVI: 1.0000 | Freq: Every day | 9 refills | Status: DC

## 2019-07-04 ENCOUNTER — Telehealth: Payer: Self-pay

## 2019-07-04 ENCOUNTER — Encounter: Payer: Self-pay | Admitting: Rehabilitative and Restorative Service Providers"

## 2019-07-04 ENCOUNTER — Ambulatory Visit (INDEPENDENT_AMBULATORY_CARE_PROVIDER_SITE_OTHER): Payer: Medicare Other | Admitting: Rehabilitative and Restorative Service Providers"

## 2019-07-04 ENCOUNTER — Other Ambulatory Visit: Payer: Self-pay

## 2019-07-04 DIAGNOSIS — R293 Abnormal posture: Secondary | ICD-10-CM | POA: Diagnosis not present

## 2019-07-04 DIAGNOSIS — G8929 Other chronic pain: Secondary | ICD-10-CM

## 2019-07-04 DIAGNOSIS — E118 Type 2 diabetes mellitus with unspecified complications: Secondary | ICD-10-CM

## 2019-07-04 DIAGNOSIS — R531 Weakness: Secondary | ICD-10-CM

## 2019-07-04 DIAGNOSIS — M25562 Pain in left knee: Secondary | ICD-10-CM

## 2019-07-04 DIAGNOSIS — Z794 Long term (current) use of insulin: Secondary | ICD-10-CM

## 2019-07-04 DIAGNOSIS — R2689 Other abnormalities of gait and mobility: Secondary | ICD-10-CM | POA: Diagnosis not present

## 2019-07-04 MED ORDER — NOVOFINE 32G X 6 MM MISC: 1.0000 | Freq: Every day | 11 refills | Status: DC

## 2019-07-04 NOTE — Patient Instructions (Signed)
Access Code: [email or web address]  URL: [email or web address]  Date: 07/04/2019  Prepared by: Gillermo Murdoch   Exercises  Prone Quad Stretch with Towel Roll and Strap - 3 reps - 1 sets - 30 sec hold - 2x daily - 7x weekly  Seated Hamstring Stretch - 3 reps - 1 sets - 30 sec hold - 2x daily - 7x weekly  Supine Quad Set - 10 reps - 1 sets - 10sec hold - 2x daily - 7x weekly  Active Straight Leg Raise with Quad Set - 10 reps - 1 sets - 5 sec hold - 2x daily - 7x weekly  Sit to Stand - 10 reps - 1 sets - 5sec hold - 2x daily - 7x weekly

## 2019-07-04 NOTE — Telephone Encounter (Signed)
erx sent as requested.  
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
## 2019-07-04 NOTE — Telephone Encounter (Signed)
Medication Requested: Insulin Pen Needle (NOVOFINE) 32G X 6 MM MISC  Is medication on med list: Yes  (if no, inform pt they may need an appointment)  Is medication a controled: NO  (yes = last OV with PCP)  -Controlled Substances: Adderall, Ritalin, oxycodone, hydrocodone, methadone, alprazolam, etc  Last visit with PCP: 11.18.2020  Is the OV > than 4 months: (yes = schedule an appt if one is not already made)  Pharmacy (Name, Street, [facility]): [facility] [address] in [place]

## 2019-07-06 ENCOUNTER — Other Ambulatory Visit: Payer: Self-pay | Admitting: Internal Medicine

## 2019-07-06 DIAGNOSIS — Z794 Long term (current) use of insulin: Secondary | ICD-10-CM

## 2019-07-06 DIAGNOSIS — E118 Type 2 diabetes mellitus with unspecified complications: Secondary | ICD-10-CM

## 2019-07-06 MED ORDER — JANUMET XR 50-1000 MG PO TB24: 1.0000 | ORAL_TABLET | Freq: Every day | ORAL | 0 refills | Status: DC

## 2019-07-07 ENCOUNTER — Other Ambulatory Visit: Payer: Self-pay

## 2019-07-07 ENCOUNTER — Ambulatory Visit: Payer: Medicare Other | Admitting: Physical Therapy

## 2019-07-07 DIAGNOSIS — R293 Abnormal posture: Secondary | ICD-10-CM

## 2019-07-07 DIAGNOSIS — M25562 Pain in left knee: Secondary | ICD-10-CM

## 2019-07-07 DIAGNOSIS — R2689 Other abnormalities of gait and mobility: Secondary | ICD-10-CM

## 2019-07-07 DIAGNOSIS — G8929 Other chronic pain: Secondary | ICD-10-CM | POA: Diagnosis not present

## 2019-07-07 DIAGNOSIS — R531 Weakness: Secondary | ICD-10-CM
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
## 2019-07-10 ENCOUNTER — Other Ambulatory Visit: Payer: Self-pay

## 2019-07-10 ENCOUNTER — Ambulatory Visit: Payer: Medicare Other | Admitting: Physical Therapy

## 2019-07-10 ENCOUNTER — Encounter: Payer: Self-pay | Admitting: Physical Therapy

## 2019-07-10 DIAGNOSIS — M25562 Pain in left knee: Secondary | ICD-10-CM | POA: Diagnosis not present

## 2019-07-10 DIAGNOSIS — R293 Abnormal posture: Secondary | ICD-10-CM

## 2019-07-10 DIAGNOSIS — G8929 Other chronic pain: Secondary | ICD-10-CM | POA: Diagnosis not present

## 2019-07-10 DIAGNOSIS — R531 Weakness: Secondary | ICD-10-CM

## 2019-07-10 DIAGNOSIS — R2689 Other abnormalities of gait and mobility: Secondary | ICD-10-CM

## 2019-07-10 NOTE — Patient Instructions (Addendum)
Kinesiology tape What is kinesiology tape?  There are many brands of kinesiology tape.  KTape, [employer], [employer], Dynamic tape, to name a few. It is an elasticized tape designed to support the body's natural healing process. This tape provides stability and support to muscles and joints without restricting motion. It can also help decrease swelling in the area of application. How does it work? The tape microscopically lifts and decompresses the skin to allow for drainage of lymph (swelling) to flow away from area, reducing inflammation.  The tape has the ability to help re-educate the neuromuscular system by targeting specific receptors in the skin.  The presence of the tape increases the body's awareness of posture and body mechanics.  Do not use with: . Open wounds . Skin lesions . Adhesive allergies Safe removal of the tape: In some rare cases, mild/moderate skin irritation can occur.  This can include redness, itchiness, or hives. If this occurs, immediately remove tape and consult your primary care physician if symptoms are severe or do not resolve within 2 days.  To remove tape safely, hold nearby skin with one hand and gentle roll tape down with other hand.  You can apply oil or conditioner to tape while in shower prior to removal to loosen adhesive.  DO NOT swiftly rip tape off like a band-aid, as this could cause skin tears and additional skin irritation.   Access Code: [email or web address]  URL: [email or web address]  Date: 07/10/2019  Prepared by: Kerin Perna   Exercises  Prone Quad Stretch with Towel Roll and Strap - 3 reps - 1 sets - 30 sec hold - 2x daily - 7x weekly  Seated Hamstring Stretch - 3 reps - 1 sets - 30 sec hold - 2x daily - 7x weekly  Forward Step Down - 10 reps - 1 sets - 1x daily - 7x weekly  Single Leg Balance - 2 reps - 1 sets - 15 hold - 1x daily - 7x weekly  Squat with Chair Touch - 10 reps - 1 sets - 1x daily - 7x weekly
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
## 2019-07-14 ENCOUNTER — Telehealth (INDEPENDENT_AMBULATORY_CARE_PROVIDER_SITE_OTHER): Payer: Medicare Other | Admitting: Neurology

## 2019-07-14 ENCOUNTER — Other Ambulatory Visit: Payer: Self-pay

## 2019-07-14 ENCOUNTER — Ambulatory Visit: Payer: Medicare Other | Admitting: Physical Therapy

## 2019-07-14 ENCOUNTER — Encounter: Payer: Self-pay | Admitting: Neurology

## 2019-07-14 ENCOUNTER — Encounter: Payer: Self-pay | Admitting: Physical Therapy

## 2019-07-14 VITALS — Ht 71.0 in | Wt 220.0 lb

## 2019-07-14 DIAGNOSIS — R531 Weakness: Secondary | ICD-10-CM

## 2019-07-14 DIAGNOSIS — M25562 Pain in left knee: Secondary | ICD-10-CM | POA: Diagnosis not present

## 2019-07-14 DIAGNOSIS — R2689 Other abnormalities of gait and mobility: Secondary | ICD-10-CM

## 2019-07-14 DIAGNOSIS — G3184 Mild cognitive impairment, so stated: Secondary | ICD-10-CM

## 2019-07-14 DIAGNOSIS — M25651 Stiffness of right hip, not elsewhere classified: Secondary | ICD-10-CM

## 2019-07-14 DIAGNOSIS — G8929 Other chronic pain: Secondary | ICD-10-CM

## 2019-07-14 DIAGNOSIS — M546 Pain in thoracic spine: Secondary | ICD-10-CM

## 2019-07-14 DIAGNOSIS — R293 Abnormal posture: Secondary | ICD-10-CM
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
## 2019-07-14 NOTE — Therapy (Signed)
[place] [place], [place], [address] Phone: [phone]   Fax:  [phone]  Physical Therapy Treatment  Patient Details  Name: Antonio Adams MRN: [id number] Date of Birth: [birth year] Referring Provider (PT): Dr Louanne Skye   Encounter Date: 07/14/2019  PT End of Session - 07/14/19 1153    Visit Number  4    Number of Visits  12    Date for PT Re-Evaluation  08/15/19    PT Start Time  5009    PT Stop Time  1235    PT Time Calculation (min)  50 min    Activity Tolerance  Patient tolerated treatment well    Behavior During Therapy  [facility] for tasks assessed/performed       Past Medical History:  Diagnosis Date  . ADENOCARCINOMA, PROSTATE, GLEASON GRADE 6 12/21/2009   prostate cancer  . ALLERGIC RHINITIS 03/16/2007  . Allergy   . Anxiety   . Arthritis    back   . BACK PAIN WITH RADICULOPATHY 11/23/2008  . Cancer of kidney ([place])    partial right kidney removed  . CARPAL TUNNEL SYNDROME, BILATERAL 03/16/2007  . Chronic back pain   . Constipation    takes Senokot daily  . Depression    occasionally  . DIABETES MELLITUS, TYPE II 03/16/2007   takes Januvia and MEtformin daily  . GASTROINTESTINAL HEMORRHAGE, HX OF 03/16/2007  . GERD 03/16/2007   takes Omeprazole daily  . Glaucoma    mild - no eye drops  . Hemorrhoids   . History of colon polyps   . HYPERLIPIDEMIA 03/16/2007   takes Crestor daily  . HYPERTENSION 03/16/2007   takes Diltiazem and Lisinopril daily   . LEG CRAMPS 05/22/2007  . Nocturia   . Overactive bladder   . PARESTHESIA 05/21/2007  . Proctitis   . Rectal bleeding    Dr.Norins has explained its from the Radiation that he has received  . Rectal bleeding   . Sleep apnea    uses CPAP nightly  . Stomach cancer ([place])   . Urinary frequency    takes Toviaz daily    Past Surgical History:  Procedure Laterality Date  . [place], 2008   repeat surgery; ESI '08  . CARPAL TUNNEL  RELEASE     bilateral  . CERVICAL FUSION    . COLONOSCOPY    . colonosocpy    . ESOPHAGOGASTRODUODENOSCOPY     ED  . FLEXIBLE SIGMOIDOSCOPY N/A 01/08/2017   Procedure: FLEXIBLE SIGMOIDOSCOPY;  Surgeon: Irene Shipper, MD;  [facility] END[facility]COPY;  Service: Endoscopy;  Laterality: N/A;  . HOT HEMOSTASIS N/A 01/08/2017   Procedure: HOT HEMOSTASIS (ARGON PLASMA COAGULATION/BICAP);  Surgeon: Irene Shipper, MD;  Location: [facility] ENDOSCOPY;  Service: Endoscopy;  Laterality: N/A;  . LUMBAR LAMINECTOMY  10/30/2011   Procedure: MICRODISCECTOMY LUMBAR LAMINECTOMY;  Surgeon: Jessy Oto, MD;  Location: [place];  Service: Orthopedics;  Laterality: Right;  Right L4-5 and L5-S1 Microdiscectomy  . LUMBAR LAMINECTOMY  06/17/2012   Procedure: MICRODISCECTOMY LUMBAR LAMINECTOMY;  Surgeon: Jessy Oto, MD;  Location: [place];  Service: Orthopedics;  Laterality: N/A;  Right L5-S1 microdiscectomy  . POLYPECTOMY    . PROSTATE SURGERY  march 2012   seed implant  . ROBOTIC ASSITED PARTIAL NEPHRECTOMY Right 09/08/2015   Procedure: XI ROBOTIC ASSITED RIGHT PARTIAL NEPHRECTOMY;  Surgeon: [name], MD;  [facility]: [facility];  Service: Urology;  Laterality: Right;  Clamp on: 1033  Clamp off: 1103 Total Clamp Time: 30 minutes  . SHOULDER ARTHROSCOPY WITH SUBACROMIAL DECOMPRESSION Left 01/20/2016   Procedure: LEFT SHOULDER ARTHROSCOPY WITH EXTENSIVE  DEBRIDEMENT, ACROMIOPLASTY;  Surgeon: Ninetta Lights, MD;  Location: [place];  Service: Orthopedics;  Laterality: Left;  . SHOULDER SURGERY  LFT  . Stress Cardiolite  12/24/2003   negative for ischemia  . UPPER ESOPHAGEAL ENDOSCOPIC ULTRASOUND (EUS)  04/18/2016   [facility] hospital  . UPPER GASTROINTESTINAL ENDOSCOPY    . 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  Subjective Assessment - 07/14/19 1152    Subjective  Pt arriving to therpay reporting no pain today. Pt reports his  HEP is going alright and he tolreated the taping fine.    Pertinent History  Rt/Lt knee pain; 4 back surgeris; 1 neck surgery; Rt RCR x 4 surgeries; injury to Lt elbow/arm area; bladder cancer; prostate cancer (currently cancer free); bilat CTS; AODM; arthritis; HTN    Patient Stated Goals  get knee pain free; increase strength in the knee    Currently in Pain?  No/denies                       [facility] Adult PT Treatment/Exercise - 07/14/19 0001      Knee/Hip Exercises: Stretches   Gastroc Stretch  Right;Left;2 reps;20 seconds      Knee/Hip Exercises: Aerobic   Recumbent Bike  L2: 3.5 min (improved tolerance)      Knee/Hip Exercises: Standing   Heel Raises  Both;10 reps    Hip Abduction  Stengthening;Both;10 reps;Knee straight    Abduction Limitations  instructions for posture correction    UE support   Hip Extension  Stengthening;Both;10 reps    Forward Step Up  Right;1 set;5 reps;Step Height: 4"    Forward Step Up Limitations  right leg up first and left leg down first   using single UE support    SLS  R: 22 seconds, L: 32 seconds    Other Standing Knee Exercises  hamstring curls R LE x 10 reps UE support    Other Standing Knee Exercises  side stepping, marching reaching opposite hand to knee x 30 feet, mini squats using bilateral UE support      Knee/Hip Exercises: Seated   Long Arc Quad  Both;10 reps   holding 5 seconds    Ball Squeeze  x 10 holding 5 seconds      Knee/Hip Exercises: Supine   Bridges  Strengthening;Both;5 reps    Bridges Limitations  Pt reporting increasing back pain    Straight Leg Raises  Strengthening;Right;10 reps    Other Supine Knee/Hip Exercises  trunk rotation holding 20 seconds x2 reps each side                  PT Long Term Goals - 07/14/19 1156      PT LONG TERM GOAL #1   Title  Increase LE strength to 4-/5 to 4/5 thoughtout    Time  6    Period  Weeks    Status  On-going      PT LONG TERM GOAL #2   Title   Improve gait with patient to ambulate without notable limp for functional distances of 100-200 ft    Time  6    Period  Weeks    Status  On-going  PT LONG TERM GOAL #3   Title  ascend and descend 4-6 steps with minimal to no pain    Time  6    Period  Weeks    Status  On-going      PT LONG TERM GOAL #4   Title  Independent in HEP    Time  6    Period  Weeks    Status  On-going      PT LONG TERM GOAL #5   Title  Improve FOTO to </= 42% limitation    Time  6    Period  Weeks    Status  On-going            Plan - 07/14/19 1154    Clinical Impression Statement  Pt tolerating standing exercises using UE support working on eccentric control. PT with limited supine activites due to increasing LBP. Pt tolerting trunk rotation stretch. Recommending R knee taping at next visit.  Continue skilled PT proressing toward pt's LTG's.    Personal Factors and Comorbidities  Comorbidity 1;Comorbidity 2;Comorbidity 3+    Comorbidities  multiple orthopedic problems; multiple surgeries; AODM    Examination-Activity Limitations  Locomotion Level;Stand    Stability/Clinical Decision Making  Stable/Uncomplicated    Rehab Potential  Good    PT Frequency  2x / week    PT Duration  6 weeks    PT Treatment/Interventions  Cryotherapy;Electrical Stimulation;Iontophoresis 4mg /ml Dexamethasone;Moist Heat;Ultrasound;Gait training;Stair training;Functional mobility training;Therapeutic activities;Therapeutic exercise;Balance training;Neuromuscular re-education;Patient/family education;Manual techniques;Dry needling;Taping    PT Next Visit Plan  progress with LE strengthening; balance; gait training as indicated; modalities as necessary    PT Home Exercise Plan  [id number]    Consulted and Agree with Plan of Care  Patient       Patient will benefit from skilled therapeutic intervention in order to improve the following deficits and impairments:  Abnormal gait, Decreased range of motion, Decreased  activity tolerance, Pain, Decreased balance, Decreased strength, Decreased mobility  Visit Diagnosis: Chronic pain of left knee  Weakness generalized  Abnormal posture  Other abnormalities of gait and mobility  Pain in thoracic spine  Stiffness of right hip, not elsewhere classified     Problem List Patient Active Problem List   Diagnosis Date Noted  . Neck pain, bilateral 10/08/2018  . CKD (chronic kidney disease) stage 3, GFR 30-59 ml/min 10/08/2018  . Murmur, cardiac 10/08/2018  . DDD (degenerative disc disease), cervical 10/08/2018  . OAB (overactive bladder) 04/04/2018  . Seasonal allergic rhinitis due to pollen 11/01/2016  . Microalbuminuria due to type 2 diabetes mellitus ([place]) 07/27/2016  . Renal cell carcinoma ([place]) 10/06/2015  . Varicose veins of both lower extremities 08/06/2015  . Varicose veins of lower extremities with ulcer ([place]) 06/16/2015  . Other constipation 08/03/2014  . Internal hemorrhoid, bleeding 08/03/2014  . Post traumatic stress disorder (PTSD) 01/29/2014  . Deficiency anemia 12/10/2013  . Radiation proctitis 06/04/2012  . Spinal stenosis of lumbar region with radiculopathy 10/30/2011    Class: Chronic  . OSA (obstructive sleep apnea) 11/24/2010  . Obesity, Class I, BMI 30-34.9 11/24/2010  . Routine general medical examination at a health care facility 11/24/2010  . ADENOCARCINOMA, PROSTATE, GLEASON GRADE 6 12/21/2009  . Type II diabetes mellitus with manifestations ([place]) 03/16/2007  . Hyperlipidemia with target LDL less than 100 03/16/2007  . Essential hypertension 03/16/2007  . GERD 03/16/2007    Oretha Caprice, PT 07/14/2019, 12:51 PM  [place]  [place], [place], [address] Phone: [phone]   Fax:  [phone]  Name: Antonio Adams MRN: [id number] Date of Birth: Dec 27, 1945

## 2019-07-17 ENCOUNTER — Ambulatory Visit: Payer: Medicare Other | Admitting: Physical Therapy

## 2019-07-17 ENCOUNTER — Other Ambulatory Visit: Payer: Self-pay

## 2019-07-17 DIAGNOSIS — M25562 Pain in left knee: Secondary | ICD-10-CM

## 2019-07-17 DIAGNOSIS — R293 Abnormal posture: Secondary | ICD-10-CM | POA: Diagnosis not present

## 2019-07-17 DIAGNOSIS — R2689 Other abnormalities of gait and mobility: Secondary | ICD-10-CM | POA: Diagnosis not present

## 2019-07-17 DIAGNOSIS — R531 Weakness: Secondary | ICD-10-CM | POA: Diagnosis not present

## 2019-07-17 DIAGNOSIS — G8929 Other chronic pain: Secondary | ICD-10-CM

## 2019-07-17 NOTE — Therapy (Signed)
[place], [place], [address] Phone: [phone]   Fax:  [phone]  Physical Therapy Treatment  Patient Details  Name: Antonio Adams MRN: [id number] Date of Birth: March 05, 1946 Referring Provider (PT): Dr Louanne Skye   Encounter Date: 07/17/2019  PT End of Session - 07/17/19 1257    Visit Number  5    Number of Visits  12    Date for PT Re-Evaluation  08/15/19    PT Start Time  1150    PT Stop Time  1229    PT Time Calculation (min)  39 min    Activity Tolerance  Patient tolerated treatment well    Behavior During Therapy  [facility] for tasks assessed/performed       Past Medical History:  Diagnosis Date  . ADENOCARCINOMA, PROSTATE, GLEASON GRADE 6 12/21/2009   prostate cancer  . ALLERGIC RHINITIS 03/16/2007  . Allergy   . Anxiety   . Arthritis    back   . BACK PAIN WITH RADICULOPATHY 11/23/2008  . Cancer of kidney ([place])    partial right kidney removed  . CARPAL TUNNEL SYNDROME, BILATERAL 03/16/2007  . Chronic back pain   . Constipation    takes Senokot daily  . Depression    occasionally  . DIABETES MELLITUS, TYPE II 03/16/2007   takes Januvia and MEtformin daily  . GASTROINTESTINAL HEMORRHAGE, HX OF 03/16/2007  . GERD 03/16/2007   takes Omeprazole daily  . Glaucoma    mild - no eye drops  . Hemorrhoids   . History of colon polyps   . HYPERLIPIDEMIA 03/16/2007   takes Crestor daily  . HYPERTENSION 03/16/2007   takes Diltiazem and Lisinopril daily   . LEG CRAMPS 05/22/2007  . Nocturia   . Overactive bladder   . PARESTHESIA 05/21/2007  . Proctitis   . Rectal bleeding    Dr.Norins has explained its from the Radiation that he has received  . Rectal bleeding   . Sleep apnea    uses CPAP nightly  . Stomach cancer ([place])   . Urinary frequency    takes Toviaz daily    Past Surgical History:  Procedure Laterality Date  . [place], 2008   repeat surgery; ESI '08  . CARPAL TUNNEL  RELEASE     bilateral  . CERVICAL FUSION    . COLONOSCOPY    . colonosocpy    . ESOPHAGOGASTRODUODENOSCOPY     ED  . FLEXIBLE SIGMOIDOSCOPY N/A 01/08/2017   Procedure: FLEXIBLE SIGMOIDOSCOPY;  Surgeon: Irene Shipper, MD;  Location: [facility] ENDOSCOPY;  Service: Endoscopy;  Laterality: N/A;  . HOT HEMOSTASIS N/A 01/08/2017   Procedure: HOT HEMOSTASIS (ARGON PLASMA COAGULATION/BICAP);  Surgeon: Irene Shipper, MD;  Location: [facility] ENDOSCOPY;  Service: Endoscopy;  Laterality: N/A;  . LUMBAR LAMINECTOMY  10/30/2011   Procedure: MICRODISCECTOMY LUMBAR LAMINECTOMY;  Surgeon: Jessy Oto, MD;  Location: [place];  Service: Orthopedics;  Laterality: Right;  Right L4-5 and L5-S1 Microdiscectomy  . LUMBAR LAMINECTOMY  06/17/2012   Procedure: MICRODISCECTOMY LUMBAR LAMINECTOMY;  Surgeon: Jessy Oto, MD;  Location: [place];  Service: Orthopedics;  Laterality: N/A;  Right L5-S1 microdiscectomy  . POLYPECTOMY    . PROSTATE SURGERY  march 2012   seed implant  . ROBOTIC ASSITED PARTIAL NEPHRECTOMY Right 09/08/2015   Procedure: XI ROBOTIC ASSITED RIGHT PARTIAL NEPHRECTOMY;  Surgeon: [name], MD;  [facility];  Service: Urology;  Laterality: Right;  Clamp on: 1033  Clamp off: 1103 Total Clamp Time: 30 minutes  . SHOULDER ARTHROSCOPY WITH SUBACROMIAL DECOMPRESSION Left 01/20/2016   Procedure: LEFT SHOULDER ARTHROSCOPY WITH EXTENSIVE  DEBRIDEMENT, ACROMIOPLASTY;  Surgeon: Ninetta Lights, MD;  Location: [place];  Service: Orthopedics;  Laterality: Left;  . SHOULDER SURGERY  LFT  . Stress Cardiolite  12/24/2003   negative for ischemia  . UPPER ESOPHAGEAL ENDOSCOPIC ULTRASOUND (EUS)  04/18/2016   [facility] hospital  . UPPER GASTROINTESTINAL ENDOSCOPY    . 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  Subjective Assessment - 07/17/19 1300    Subjective  Pt reports his stairs are getting a little easier; he can tell  things are improving.    Pertinent History  Rt/Lt knee pain; 4 back surgeris; 1 neck surgery; Rt RCR x 4 surgeries; injury to Lt elbow/arm area; bladder cancer; prostate cancer (currently cancer free); bilat CTS; AODM; arthritis; HTN    Patient Stated Goals  get knee pain free; increase strength in the knee    Currently in Pain?  No/denies    Pain Score  0-No pain         [facility] PT Assessment - 07/17/19 0001      Assessment   Medical Diagnosis  Lt knee pain     Referring Provider (PT)  Dr Louanne Skye    Onset Date/Surgical Date  12/14/18    Hand Dominance  Right    Next MD Visit  07/23/19    Prior Therapy  for other ortho problems       [facility] Adult PT Treatment/Exercise - 07/17/19 0001      Knee/Hip Exercises: Stretches   Passive Hamstring Stretch  Right;2 reps;Left;3 reps;20 seconds   foot on 12" step   Knee: Self-Stretch to increase Flexion  Right;Left   forward lunge on 12" step   Gastroc Stretch  Right;Left;2 reps;20 seconds      Knee/Hip Exercises: Aerobic   Nustep  L5: 5.5 min       Knee/Hip Exercises: Standing   Terminal Knee Extension  Strengthening;Right;1 set;10 reps    Lateral Step Up  Right;1 set;5 reps;Step Height: 6";Hand Hold: 2    Lateral Step Up Limitations  painful and buckling of knee    Forward Step Up  Left;Right;1 set;10 reps;Step Height: 4";Hand Hold: 2    Forward Step Up Limitations  trial of Rt forward step up with RLE on 6" step, knee buckles 1/2 way up.     Step Down  Left;1 set;10 reps    Step Down Limitations  pain with retro step up with RLE, tolerated 3 reps only.       Knee/Hip Exercises: Seated   Sit to Sand  5 reps;with UE support      Manual Therapy   Manual therapy comments  I strip of Reg Rock tape applied to medial and lateral Rt knee (crossing at attachment of patellar tendon) with 15% stretch, to decompress tissue, increase proprioception.   Strip of reg rock tape applied Lt medial knee at joint line (in x pattern with 25% stretch                   PT Long Term Goals - 07/14/19 1156      PT LONG TERM GOAL #1   Title  Increase LE strength to 4-/5 to 4/5 thoughtout    Time  6  Period  Weeks    Status  On-going      PT LONG TERM GOAL #2   Title  Improve gait with patient to ambulate without notable limp for functional distances of 100-200 ft    Time  6    Period  Weeks    Status  On-going      PT LONG TERM GOAL #3   Title  ascend and descend 4-6 steps with minimal to no pain    Time  6    Period  Weeks    Status  On-going      PT LONG TERM GOAL #4   Title  Independent in HEP    Time  6    Period  Weeks    Status  On-going      PT LONG TERM GOAL #5   Title  Improve FOTO to </= 42% limitation    Time  6    Period  Weeks    Status  On-going            Plan - 07/17/19 1254    Clinical Impression Statement  Pt completed 2 reps of forward step ups without knee buckling, after this each rep there is a buckle/ pain in Rt knee but he is able to complete to full knee ext with support through his arms. Pt reported some low back discomfort during prone quad stretch with noodle under thigh; improved tolerance once noodle removed. Pt making gradual gains towards LTGs.    Personal Factors and Comorbidities  Comorbidity 1;Comorbidity 2;Comorbidity 3+    Comorbidities  multiple orthopedic problems; multiple surgeries; AODM    Examination-Activity Limitations  Locomotion Level;Stand    Stability/Clinical Decision Making  Stable/Uncomplicated    Rehab Potential  Good    PT Frequency  2x / week    PT Duration  6 weeks    PT Treatment/Interventions  Cryotherapy;Electrical Stimulation;Iontophoresis 4mg /ml Dexamethasone;Moist Heat;Ultrasound;Gait training;Stair training;Functional mobility training;Therapeutic activities;Therapeutic exercise;Balance training;Neuromuscular re-education;Patient/family education;Manual techniques;Dry needling;Taping    PT Next Visit Plan  progress with LE strengthening;  balance; gait training as indicated; modalities as necessary    PT Home Exercise Plan  [id number]    Consulted and Agree with Plan of Care  Patient       Patient will benefit from skilled therapeutic intervention in order to improve the following deficits and impairments:  Abnormal gait, Decreased range of motion, Decreased activity tolerance, Pain, Decreased balance, Decreased strength, Decreased mobility  Visit Diagnosis: Chronic pain of left knee  Weakness generalized  Abnormal posture  Other abnormalities of gait and mobility     Problem List Patient Active Problem List   Diagnosis Date Noted  . Neck pain, bilateral 10/08/2018  . CKD (chronic kidney disease) stage 3, GFR 30-59 ml/min 10/08/2018  . Murmur, cardiac 10/08/2018  . DDD (degenerative disc disease), cervical 10/08/2018  . OAB (overactive bladder) 04/04/2018  . Seasonal allergic rhinitis due to pollen 11/01/2016  . Microalbuminuria due to type 2 diabetes mellitus ([place]) 07/27/2016  . Renal cell carcinoma ([place]) 10/06/2015  . Varicose veins of both lower extremities 08/06/2015  . Varicose veins of lower extremities with ulcer ([place]) 06/16/2015  . Other constipation 08/03/2014  . Internal hemorrhoid, bleeding 08/03/2014  . Post traumatic stress disorder (PTSD) 01/29/2014  . Deficiency anemia 12/10/2013  . Radiation proctitis 06/04/2012  . Spinal stenosis of lumbar region with radiculopathy 10/30/2011    Class: Chronic  . OSA (obstructive sleep apnea) 11/24/2010  . Obesity, Class I, BMI 30-34.9  11/24/2010  . Routine general medical examination at a health care facility 11/24/2010  . ADENOCARCINOMA, PROSTATE, GLEASON GRADE 6 12/21/2009  . Type II diabetes mellitus with manifestations ([place]) 03/16/2007  . Hyperlipidemia with target LDL less than 100 03/16/2007  . Essential hypertension 03/16/2007  . GERD 03/16/2007   Kerin Perna, PTA 07/17/19 1:04 PM  [place], [place], [address] Phone: [phone]   Fax:  [phone]  Name: NAVEED HUMPHRES MRN: [id number] Date of Birth: [birth year]

## 2019-07-21 ENCOUNTER — Ambulatory Visit: Payer: Medicare Other | Admitting: Physical Therapy

## 2019-07-21 ENCOUNTER — Other Ambulatory Visit: Payer: Self-pay

## 2019-07-21 DIAGNOSIS — R293 Abnormal posture: Secondary | ICD-10-CM | POA: Diagnosis not present

## 2019-07-21 DIAGNOSIS — R531 Weakness: Secondary | ICD-10-CM | POA: Diagnosis not present

## 2019-07-21 DIAGNOSIS — G8929 Other chronic pain: Secondary | ICD-10-CM | POA: Diagnosis not present

## 2019-07-21 DIAGNOSIS — M25562 Pain in left knee: Secondary | ICD-10-CM

## 2019-07-21 NOTE — Therapy (Signed)
[place], [place], [address] Phone: [phone]   Fax:  [phone]  Physical Therapy Treatment  Patient Details  Name: Antonio Adams MRN: [id number] Date of Birth: 1945/12/29 Referring Provider (PT): Dr Louanne Skye   Encounter Date: 07/21/2019  PT End of Session - 07/21/19 1443    Visit Number  6    Number of Visits  12    Date for PT Re-Evaluation  08/15/19    PT Start Time  1433    PT Stop Time  1513    PT Time Calculation (min)  40 min    Activity Tolerance  Patient tolerated treatment well    Behavior During Therapy  [facility] for tasks assessed/performed       Past Medical History:  Diagnosis Date  . ADENOCARCINOMA, PROSTATE, GLEASON GRADE 6 12/21/2009   prostate cancer  . ALLERGIC RHINITIS 03/16/2007  . Allergy   . Anxiety   . Arthritis    back   . BACK PAIN WITH RADICULOPATHY 11/23/2008  . Cancer of kidney ([place])    partial right kidney removed  . CARPAL TUNNEL SYNDROME, BILATERAL 03/16/2007  . Chronic back pain   . Constipation    takes Senokot daily  . Depression    occasionally  . DIABETES MELLITUS, TYPE II 03/16/2007   takes Januvia and MEtformin daily  . GASTROINTESTINAL HEMORRHAGE, HX OF 03/16/2007  . GERD 03/16/2007   takes Omeprazole daily  . Glaucoma    mild - no eye drops  . Hemorrhoids   . History of colon polyps   . HYPERLIPIDEMIA 03/16/2007   takes Crestor daily  . HYPERTENSION 03/16/2007   takes Diltiazem and Lisinopril daily   . LEG CRAMPS 05/22/2007  . Nocturia   . Overactive bladder   . PARESTHESIA 05/21/2007  . Proctitis   . Rectal bleeding    Dr.Norins has explained its from the Radiation that he has received  . Rectal bleeding   . Sleep apnea    uses CPAP nightly  . Stomach cancer ([place])   . Urinary frequency    takes Toviaz daily    Past Surgical History:  Procedure Laterality Date  . [place], 2008   repeat surgery; ESI '08  . CARPAL TUNNEL  RELEASE     bilateral  . CERVICAL FUSION    . COLONOSCOPY    . colonosocpy    . ESOPHAGOGASTRODUODENOSCOPY     ED  . FLEXIBLE SIGMOIDOSCOPY N/A 01/08/2017   Procedure: FLEXIBLE SIGMOIDOSCOPY;  Surgeon: Irene Shipper, MD;  Location[facility] ENDOSCOPY;  Service: Endoscopy;  Laterality: N/A;  . HOT HEMOSTASIS N/A 01/08/2017   Procedure: HOT HEMOSTASIS (ARGON PLASMA COAGULATION/BICAP);  Surgeon: Irene Shipper, MD;  Location: [facility] ENDOSCOPY;  Service: Endoscopy;  Laterality: N/A;  . LUMBAR LAMINECTOMY  10/30/2011   Procedure: MICRODISCECTOMY LUMBAR LAMINECTOMY;  Surgeon: Jessy Oto, MD;  Location: [place];  Service: Orthopedics;  Laterality: Right;  Right L4-5 and L5-S1 Microdiscectomy  . LUMBAR LAMINECTOMY  06/17/2012   Procedure: MICRODISCECTOMY LUMBAR LAMINECTOMY;  Surgeon: Jessy Oto, MD;  Location: [place];  Service: Orthopedics;  Laterality: N/A;  Right L5-S1 microdiscectomy  . POLYPECTOMY    . PROSTATE SURGERY  march 2012   seed implant  . ROBOTIC ASSITED PARTIAL NEPHRECTOMY Right 09/08/2015   Procedure: XI ROBOTIC ASSITED RIGHT PARTIAL NEPHRECTOMY;  Surgeon: [name], MD;  [facility];  Service: Urology;  Laterality: Right;  Clamp on: 1033  Clamp off: 1103 Total Clamp Time: 30 minutes  . SHOULDER ARTHROSCOPY WITH SUBACROMIAL DECOMPRESSION Left 01/20/2016   Procedure: LEFT SHOULDER ARTHROSCOPY WITH EXTENSIVE  DEBRIDEMENT, ACROMIOPLASTY;  Surgeon: Ninetta Lights, MD;  Location: [place];  Service: Orthopedics;  Laterality: Left;  . SHOULDER SURGERY  LFT  . Stress Cardiolite  12/24/2003   negative for ischemia  . UPPER ESOPHAGEAL ENDOSCOPIC ULTRASOUND (EUS)  04/18/2016   [facility] hospital  . UPPER GASTROINTESTINAL ENDOSCOPY    . 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  Subjective Assessment - 07/21/19 1439    Subjective  Pt was able to take ktape off this time without any issues. His  knees feel good with the tape on them, "supportive".  Lt knee doesn't hurt as long as he doesn't do a lot of stairs.    Pertinent History  Rt/Lt knee pain; 4 back surgeries; 1 neck surgery; Rt RCR x 4 surgeries; injury to Lt elbow/arm area; bladder cancer; prostate cancer (currently cancer free); bilat CTS; AODM; arthritis; HTN    Patient Stated Goals  get knee pain free; increase strength in the knee    Currently in Pain?  No/denies    Pain Score  0-No pain         [facility] PT Assessment - 07/21/19 0001      Assessment   Medical Diagnosis  Lt knee pain     Referring Provider (PT)  Dr Louanne Skye    Onset Date/Surgical Date  12/14/18    Hand Dominance  Right    Next MD Visit  07/23/19    Prior Therapy  for other ortho problems       Observation/Other Assessments   Focus on Therapeutic Outcomes (FOTO)   23% limitation       Strength   Right Hip Flexion  3-/5    Right Hip Extension  4+/5    Right Hip ABduction  2+/5    Left Hip Flexion  3/5    Left Hip Extension  3+/5    Left Hip ABduction  3-/5    Right Knee Flexion  5/5    Right Knee Extension  5/5    Left Knee Flexion  4+/5    Left Knee Extension  4+/5      [facility] Adult PT Treatment/Exercise - 07/21/19 0001      Knee/Hip Exercises: Stretches   Passive Hamstring Stretch  Right;2 reps;Left;3 reps;20 seconds   foot on 12" step   Gastroc Stretch  Right;Left;2 reps;20 seconds      Knee/Hip Exercises: Aerobic   Recumbent Bike  L2 x 4 min, L1 x 1.5    Other Aerobic  lap around gym to decrease stiffness after MMT.       Knee/Hip Exercises: Standing   Hip Flexion  Right;Left;1 set;10 reps;Knee bent    Hip Abduction  Stengthening;Both;10 reps;Knee straight    Hip Extension  Stengthening;Both;10 reps    Stairs  reciprocal pattern of 6" step with BUE on rails x 6 steps     SLS  Lt/Rt SLS x 2 reps up to 20 sec     Other Standing Knee Exercises  tandem stance x 20 sec each leg forward.       Knee/Hip Exercises: Seated   Sit to Sand  1  set;without UE support;5 reps           PT  Long Term Goals - 07/21/19 1505      PT LONG TERM GOAL #1   Title  Increase LE strength to 4-/5 to 4/5 thoughtout    Time  6    Period  Weeks    Status  Partially Met      PT LONG TERM GOAL #2   Title  Improve gait with patient to ambulate without notable limp for functional distances of 100-200 ft    Time  6    Period  Weeks    Status  On-going      PT LONG TERM GOAL #3   Title  ascend and descend 4-6 steps with minimal to no pain    Time  6    Period  Weeks    Status  Partially Met      PT LONG TERM GOAL #4   Title  Independent in HEP    Time  6    Period  Weeks    Status  On-going      PT LONG TERM GOAL #5   Title  Improve FOTO to </= 42% limitation    Time  6    Period  Weeks    Status  Achieved            Plan - 07/21/19 1440    Clinical Impression Statement  Pt reported "increased resistance" in Rt knee with L2 on bike today (increased achiness in knee during).    He continues to have some functional weakness in Rt quad and bilat hips.  Pt has met FOTO goal and partially met his stair goal; progressing gradually towards remaining goals.    Personal Factors and Comorbidities  Comorbidity 1;Comorbidity 2;Comorbidity 3+    Comorbidities  multiple orthopedic problems; multiple surgeries; AODM    Examination-Activity Limitations  Locomotion Level;Stand    Stability/Clinical Decision Making  Stable/Uncomplicated    Rehab Potential  Good    PT Frequency  2x / week    PT Duration  6 weeks    PT Treatment/Interventions  Cryotherapy;Electrical Stimulation;Iontophoresis 63m/ml Dexamethasone;Moist Heat;Ultrasound;Gait training;Stair training;Functional mobility training;Therapeutic activities;Therapeutic exercise;Balance training;Neuromuscular re-education;Patient/family education;Manual techniques;Dry needling;Taping    PT Next Visit Plan  tape knees, teach tape technique, assess readiness to hold vs d/c.    PT Home  Exercise Plan  [id number]   Consulted and Agree with Plan of Care  Patient       Patient will benefit from skilled therapeutic intervention in order to improve the following deficits and impairments:  Abnormal gait, Decreased range of motion, Decreased activity tolerance, Pain, Decreased balance, Decreased strength, Decreased mobility  Visit Diagnosis: Chronic pain of left knee  Weakness generalized  Abnormal posture     Problem List Patient Active Problem List   Diagnosis Date Noted  . Neck pain, bilateral 10/08/2018  . CKD (chronic kidney disease) stage 3, GFR 30-59 ml/min 10/08/2018  . Murmur, cardiac 10/08/2018  . DDD (degenerative disc disease), cervical 10/08/2018  . OAB (overactive bladder) 04/04/2018  . Seasonal allergic rhinitis due to pollen 11/01/2016  . Microalbuminuria due to type 2 diabetes mellitus ([place] 07/27/2016  . Renal cell carcinoma ([place] 10/06/2015  . Varicose veins of both lower extremities 08/06/2015  . Varicose veins of lower extremities with ulcer ([place] 06/16/2015  . Other constipation 08/03/2014  . Internal hemorrhoid, bleeding 08/03/2014  . Post traumatic stress disorder (PTSD) 01/29/2014  . Deficiency anemia 12/10/2013  . Radiation proctitis 06/04/2012  . Spinal stenosis of lumbar region with radiculopathy 10/30/2011  Class: Chronic  . OSA (obstructive sleep apnea) 11/24/2010  . Obesity, Class I, BMI 30-34.9 11/24/2010  . Routine general medical examination at a health care facility 11/24/2010  . ADENOCARCINOMA, PROSTATE, GLEASON GRADE 6 12/21/2009  . Type II diabetes mellitus with manifestations ([place]) 03/16/2007  . Hyperlipidemia with target LDL less than 100 03/16/2007  . Essential hypertension 03/16/2007  . GERD 03/16/2007   Kerin Perna, PTA 07/21/19 3:16 PM  [place], [place], [address] Phone: [phone]   Fax:  [phone]  Name:  Antonio Adams MRN: [id number] Date of Birth: 09-Dec-1945

## 2019-07-23 ENCOUNTER — Other Ambulatory Visit: Payer: Self-pay

## 2019-07-23 ENCOUNTER — Encounter: Payer: Self-pay | Admitting: Specialist

## 2019-07-23 ENCOUNTER — Ambulatory Visit: Payer: Medicare Other | Admitting: Specialist

## 2019-07-23 VITALS — BP 140/79 | HR 72 | Ht 71.0 in | Wt 216.0 lb

## 2019-07-23 DIAGNOSIS — M5136 Other intervertebral disc degeneration, lumbar region: Secondary | ICD-10-CM

## 2019-07-23 DIAGNOSIS — M25562 Pain in left knee: Secondary | ICD-10-CM

## 2019-07-23 DIAGNOSIS — M1711 Unilateral primary osteoarthritis, right knee: Secondary | ICD-10-CM | POA: Diagnosis not present

## 2019-07-23 NOTE — Progress Notes (Signed)
Office Visit Note   Patient: Antonio Adams           Date of Birth: 1945-09-14           MRN: [id number] Visit Date: 07/23/2019              Requested by: [name], MD [address] [place],  [place] [address] PCP: [name], MD   Assessment & Plan: Visit Diagnoses:  1. Unilateral primary osteoarthritis, right knee   2. Degenerative disc disease, lumbar   3. Left knee pain, unspecified chronicity     Plan: Knee is suffering from osteoarthritis, only real proven treatments are Weight loss, can't take NSIADs like diclofenac due to risk to remaining good kidney and exercise Diclofenac gel up to 3-4 times per day. Well padded shoes help. Ice the knee 2-3 times a day 15-20 mins at a time. Continue with PT and activity modification. Try to decrease knee flexion.  Follow-Up Instructions: Return in about 4 weeks (around 08/20/2019).   Orders:  No orders of the defined types were placed in this encounter.  No orders of the defined types were placed in this encounter.     Procedures: No procedures performed   Clinical Data: No additional findings.   Subjective: Chief Complaint  Patient presents with  . Left Knee - Follow-up    74 year old male with history of 3 level lumbar DDD post multiple lumbar laminectomies and he is having right greater than left knee Pain . Pain is worse with stair climbing. 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 He is in PT and finds it is helping.   Review of Systems  Constitutional: Negative for activity change, appetite change, chills, diaphoresis, fatigue, fever and unexpected weight change.  HENT: Positive for tinnitus. Negative for congestion, dental problem, drooling, ear discharge, ear pain, facial swelling, hearing loss, mouth sores, nosebleeds, postnasal drip, rhinorrhea, sinus  pressure, sinus pain, sneezing, sore throat, trouble swallowing and voice change.   Eyes: Negative.   Respiratory: Negative.  Negative for apnea, cough, choking, chest tightness, shortness of breath, wheezing and stridor.   Cardiovascular: Negative.  Negative for chest pain, palpitations and leg swelling.  Gastrointestinal: Negative.  Negative for abdominal distention, abdominal pain, anal bleeding, blood in stool, constipation, diarrhea, nausea and rectal pain.  Endocrine: Negative.  Negative for cold intolerance, heat intolerance, polydipsia and polyphagia.  Genitourinary: Negative.  Negative for difficulty urinating, dysuria, enuresis, flank pain, frequency, hematuria and urgency.  Musculoskeletal: Positive for arthralgias, back pain and gait problem. Negative for joint swelling.  Skin: Negative.  Negative for color change, pallor, rash and wound.  Allergic/Immunologic: Negative.  Negative for environmental allergies, food allergies and immunocompromised state.  Neurological: Negative for dizziness, tremors, seizures, syncope, facial asymmetry, speech difficulty, weakness, light-headedness, numbness and headaches.  Hematological: Negative for adenopathy. Does not bruise/bleed easily.  Psychiatric/Behavioral: Negative.  Negative for agitation, behavioral problems, confusion, decreased concentration, dysphoric mood, hallucinations, self-injury, sleep disturbance and suicidal ideas. The patient is not nervous/anxious and is not hyperactive.      Objective: Vital Signs: BP 140/79 (BP Location: Left Arm, Patient Position: Sitting)   Pulse 72   Ht 5\' 11"  (1.803 m)   Wt 216 lb (98 kg)   BMI 30.13 kg/m   Physical Exam  Ortho Exam  Specialty Comments:  No specialty comments  available.  Imaging: No results found.   PMFS History: Patient Active Problem List   Diagnosis Date Noted  . Spinal stenosis of lumbar region with radiculopathy 10/30/2011    Priority: High    Class: Chronic  .  Neck pain, bilateral 10/08/2018  . CKD (chronic kidney disease) stage 3, GFR 30-59 ml/min 10/08/2018  . Murmur, cardiac 10/08/2018  . DDD (degenerative disc disease), cervical 10/08/2018  . OAB (overactive bladder) 04/04/2018  . Seasonal allergic rhinitis due to pollen 11/01/2016  . Microalbuminuria due to type 2 diabetes mellitus ([place]) 07/27/2016  . Renal cell carcinoma ([place]) 10/06/2015  . Varicose veins of both lower extremities 08/06/2015  . Varicose veins of lower extremities with ulcer ([place]) 06/16/2015  . Other constipation 08/03/2014  . Internal hemorrhoid, bleeding 08/03/2014  . Post traumatic stress disorder (PTSD) 01/29/2014  . Deficiency anemia 12/10/2013  . Radiation proctitis 06/04/2012  . OSA (obstructive sleep apnea) 11/24/2010  . Obesity, Class I, BMI 30-34.9 11/24/2010  . Routine general medical examination at a health care facility 11/24/2010  . ADENOCARCINOMA, PROSTATE, GLEASON GRADE 6 12/21/2009  . Type II diabetes mellitus with manifestations ([place]) 03/16/2007  . Hyperlipidemia with target LDL less than 100 03/16/2007  . Essential hypertension 03/16/2007  . GERD 03/16/2007   Past Medical History:  Diagnosis Date  . ADENOCARCINOMA, PROSTATE, GLEASON GRADE 6 12/21/2009   prostate cancer  . ALLERGIC RHINITIS 03/16/2007  . Allergy   . Anxiety   . Arthritis    back   . BACK PAIN WITH RADICULOPATHY 11/23/2008  . Cancer of kidney ([place])    partial right kidney removed  . CARPAL TUNNEL SYNDROME, BILATERAL 03/16/2007  . Chronic back pain   . Constipation    takes Senokot daily  . Depression    occasionally  . DIABETES MELLITUS, TYPE II 03/16/2007   takes Januvia and MEtformin daily  . GASTROINTESTINAL HEMORRHAGE, HX OF 03/16/2007  . GERD 03/16/2007   takes Omeprazole daily  . Glaucoma    mild - no eye drops  . Hemorrhoids   . History of colon polyps   . HYPERLIPIDEMIA 03/16/2007   takes Crestor daily  . HYPERTENSION 03/16/2007   takes Diltiazem and Lisinopril  daily   . LEG CRAMPS 05/22/2007  . Nocturia   . Overactive bladder   . PARESTHESIA 05/21/2007  . Proctitis   . Rectal bleeding    Dr.Norins has explained its from the Radiation that he has received  . Rectal bleeding   . Sleep apnea    uses CPAP nightly  . Stomach cancer ([place])   . Urinary frequency    takes Toviaz daily    Family History  Problem Relation Age of Onset  . Cancer Sister 30       Cancer, unsure type  . Hypertension Other   . Colon cancer Neg Hx   . Esophageal cancer Neg Hx   . Stomach cancer Neg Hx   . Diabetes Neg Hx   . Colon polyps Neg Hx   . Rectal cancer Neg Hx     Past Surgical History:  Procedure Laterality Date  . [place], 2008   repeat surgery; ESI '08  . CARPAL TUNNEL RELEASE     bilateral  . CERVICAL FUSION    . COLONOSCOPY    . colonosocpy    . ESOPHAGOGASTRODUODENOSCOPY     ED  . FLEXIBLE SIGMOIDOSCOPY N/A 01/08/2017   Procedure: FLEXIBLE SIGMOIDOSCOPY;  Surgeon: Irene Shipper, MD;  Location: [facility]  ENDOSCOPY;  Service: Endoscopy;  Laterality: N/A;  . HOT HEMOSTASIS N/A 01/08/2017   Procedure: HOT HEMOSTASIS (ARGON PLASMA COAGULATION/BICAP);  Surgeon: Irene Shipper, MD;  Location: [facility] ENDOSCOPY;  Service: Endoscopy;  Laterality: N/A;  . LUMBAR LAMINECTOMY  10/30/2011   Procedure: MICRODISCECTOMY LUMBAR LAMINECTOMY;  Surgeon: Jessy Oto, MD;  Location: [place];  Service: Orthopedics;  Laterality: Right;  Right L4-5 and L5-S1 Microdiscectomy  . LUMBAR LAMINECTOMY  06/17/2012   Procedure: MICRODISCECTOMY LUMBAR LAMINECTOMY;  Surgeon: Jessy Oto, MD;  Location: [place];  Service: Orthopedics;  Laterality: N/A;  Right L5-S1 microdiscectomy  . POLYPECTOMY    . PROSTATE SURGERY  march 2012   seed implant  . ROBOTIC ASSITED PARTIAL NEPHRECTOMY Right 09/08/2015   Procedure: XI ROBOTIC ASSITED RIGHT PARTIAL NEPHRECTOMY;  Surgeon: [name], MD;  [facility];  Service: Urology;  Laterality: Right;  Clamp on: 1033 Clamp off: 1103 Total  Clamp Time: 30 minutes  . SHOULDER ARTHROSCOPY WITH SUBACROMIAL DECOMPRESSION Left 01/20/2016   Procedure: LEFT SHOULDER ARTHROSCOPY WITH EXTENSIVE  DEBRIDEMENT, ACROMIOPLASTY;  Surgeon: Ninetta Lights, MD;  Location: [place];  Service: Orthopedics;  Laterality: Left;  . SHOULDER SURGERY  LFT  . Stress Cardiolite  12/24/2003   negative for ischemia  . UPPER ESOPHAGEAL ENDOSCOPIC ULTRASOUND (EUS)  04/18/2016   [facility] hospital  . UPPER GASTROINTESTINAL ENDOSCOPY    . WOUND EXPLORATION     management of a wound that he sustained in the [place] History  . 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 Smoking status: Never Smoker  . Smokeless tobacco: Never Used  Substance and Sexual Activity  . Alcohol use: Not Currently    Alcohol/week: 0.0 standard drinks    Comment: occasional  . Drug use: No  . Sexual activity: Not Currently    Partners: Female

## 2019-07-23 NOTE — Patient Instructions (Addendum)
  Plan: Knee is suffering from osteoarthritis, only real proven treatments are Weight loss, can't take NSIADs like diclofenac due to risk to remaining good kidney and exercise Diclofenac gel up to 3-4 times per day. Well padded shoes help. Ice the knee 2-3 times a day 15-20 mins at a time. Continue with PT and activity modification. Try to decrease knee flexion.

## 2019-07-24 ENCOUNTER — Ambulatory Visit: Payer: Medicare Other | Admitting: Physical Therapy

## 2019-07-24 DIAGNOSIS — M25562 Pain in left knee: Secondary | ICD-10-CM

## 2019-07-24 DIAGNOSIS — R531 Weakness: Secondary | ICD-10-CM | POA: Diagnosis not present

## 2019-07-24 DIAGNOSIS — G8929 Other chronic pain: Secondary | ICD-10-CM

## 2019-07-24 DIAGNOSIS — R293 Abnormal posture: Secondary | ICD-10-CM

## 2019-07-24 NOTE — Therapy (Addendum)
[facility]  [place], [place], [address] Phone: [phone]   Fax:  [phone]  Physical Therapy Treatment  Patient Details  Name: Antonio Adams MRN: [id number] Date of Birth: 1946/02/04 Referring Provider (PT): Dr Louanne Skye   Encounter Date: 07/24/2019  PT End of Session - 07/24/19 0946    Visit Number  7    Number of Visits  12    Date for PT Re-Evaluation  08/15/19    PT Start Time  0935    PT Stop Time  1014    PT Time Calculation (min)  39 min    Activity Tolerance  Patient tolerated treatment well    Behavior During Therapy  [facility] for tasks assessed/performed       Past Medical History:  Diagnosis Date  . ADENOCARCINOMA, PROSTATE, GLEASON GRADE 6 12/21/2009   prostate cancer  . ALLERGIC RHINITIS 03/16/2007  . Allergy   . Anxiety   . Arthritis    back   . BACK PAIN WITH RADICULOPATHY 11/23/2008  . Cancer of kidney ([place])    partial right kidney removed  . CARPAL TUNNEL SYNDROME, BILATERAL 03/16/2007  . Chronic back pain   . Constipation    takes Senokot daily  . Depression    occasionally  . DIABETES MELLITUS, TYPE II 03/16/2007   takes Januvia and MEtformin daily  . GASTROINTESTINAL HEMORRHAGE, HX OF 03/16/2007  . GERD 03/16/2007   takes Omeprazole daily  . Glaucoma    mild - no eye drops  . Hemorrhoids   . History of colon polyps   . HYPERLIPIDEMIA 03/16/2007   takes Crestor daily  . HYPERTENSION 03/16/2007   takes Diltiazem and Lisinopril daily   . LEG CRAMPS 05/22/2007  . Nocturia   . Overactive bladder   . PARESTHESIA 05/21/2007  . Proctitis   . Rectal bleeding    Dr.Norins has explained its from the Radiation that he has received  . Rectal bleeding   . Sleep apnea    uses CPAP nightly  . Stomach cancer ([place])   . Urinary frequency    takes Toviaz daily    Past Surgical History:  Procedure Laterality Date  . [place], 2008   repeat surgery; ESI '08  . CARPAL TUNNEL  RELEASE     bilateral  . CERVICAL FUSION    . COLONOSCOPY    . colonosocpy    . ESOPHAGOGASTRODUODENOSCOPY     ED  . FLEXIBLE SIGMOIDOSCOPY N/A 01/08/2017   Procedure: FLEXIBLE SIGMOIDOSCOPY;  Surgeon: Irene Shipper, MD;  Location: [facility] ENDOSCOPY;  Service: Endoscopy;  Laterality: N/A;  . HOT HEMOSTASIS N/A 01/08/2017   Procedure: HOT HEMOSTASIS (ARGON PLASMA COAGULATION/BICAP);  Surgeon: Irene Shipper, MD;  Location: [facility] ENDOSCOPY;  Service: Endoscopy;  Laterality: N/A;  . LUMBAR LAMINECTOMY  10/30/2011   Procedure: MICRODISCECTOMY LUMBAR LAMINECTOMY;  Surgeon: Jessy Oto, MD;  Location: [place];  Service: Orthopedics;  Laterality: Right;  Right L4-5 and L5-S1 Microdiscectomy  . LUMBAR LAMINECTOMY  06/17/2012   Procedure: MICRODISCECTOMY LUMBAR LAMINECTOMY;  Surgeon: Jessy Oto, MD;  Location: [place];  Service: Orthopedics;  Laterality: N/A;  Right L5-S1 microdiscectomy  . POLYPECTOMY    . PROSTATE SURGERY  march 2012   seed implant  . ROBOTIC ASSITED PARTIAL NEPHRECTOMY Right 09/08/2015   Procedure: XI ROBOTIC ASSITED RIGHT PARTIAL NEPHRECTOMY;  Surgeon: [name], MD;  [facility];  Service: Urology;  Laterality: Right;  Clamp on: 1033  Clamp off: 1103 Total Clamp Time: 30 minutes  . SHOULDER ARTHROSCOPY WITH SUBACROMIAL DECOMPRESSION Left 01/20/2016   Procedure: LEFT SHOULDER ARTHROSCOPY WITH EXTENSIVE  DEBRIDEMENT, ACROMIOPLASTY;  Surgeon: Ninetta Lights, MD;  Location: [place];  Service: Orthopedics;  Laterality: Left;  . SHOULDER SURGERY  LFT  . Stress Cardiolite  12/24/2003   negative for ischemia  . UPPER ESOPHAGEAL ENDOSCOPIC ULTRASOUND (EUS)  04/18/2016   [facility]  . UPPER GASTROINTESTINAL ENDOSCOPY    . 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  Subjective Assessment - 07/24/19 0946    Subjective  Pt reports he saw his MD; recommends a TKR in future, "but not  yet".    He requests to put therapy on hold after today since he will be busy with work for a few weeks.    Patient Stated Goals  get knee pain free; increase strength in the knee    Currently in Pain?  Yes    Pain Score  3     Pain Location  Knee    Pain Orientation  Right    Pain Descriptors / Indicators  Aching    Aggravating Factors   certain motions; stairs    Pain Relieving Factors  muscle cream, copper sleeve.         [facility] PT Assessment - 07/24/19 0001      Assessment   Medical Diagnosis  Lt knee pain     Referring Provider (PT)  Dr Louanne Skye    Onset Date/Surgical Date  12/14/18    Hand Dominance  Right    Next MD Visit  May 2021    Prior Therapy  for other ortho problems       [facility] Adult PT Treatment/Exercise - 07/24/19 0001      Self-Care   Self-Care  Other Self-Care Comments    Other Self-Care Comments   Pt instructed in ktape application technique; pt returned demo with tactile and verbal cues      Knee/Hip Exercises: Stretches   Passive Hamstring Stretch  Right;2 reps;Left;30 seconds   foot on 12" step   Quad Stretch  Right;Left;2 reps;30 seconds   prone quad stretch   Gastroc Stretch  Right;Left;3 reps;20 seconds   heels off of step     Knee/Hip Exercises: Aerobic   Recumbent Bike  L1 x 5 min       Knee/Hip Exercises: Standing   Forward Step Up  Left;Right;1 set;5 reps;Step Height: 6";Hand Hold: 2    Stairs  reciprocal pattern of 6" step with BUE on rails x 6 steps       Manual Therapy   Manual therapy comments  I strip of Reg Rock tape applied to medial and lateral Rt knee (crossing at attachment of patellar tendon) with 15% stretch, to decompress tissue, increase proprioception.   Strip of reg rock tape applied Lt medial knee at joint line (in x pattern with 25% stretch                  PT Long Term Goals - 07/21/19 1505      PT LONG TERM GOAL #1   Title  Increase LE strength to 4-/5 to 4/5 thoughtout    Time  6    Period  Weeks     Status  On-going      PT LONG TERM GOAL #2  Title  Improve gait with patient to ambulate without notable limp for functional distances of 100-200 ft    Time  6    Period  Weeks    Status  On-going      PT LONG TERM GOAL #3   Title  ascend and descend 4-6 steps with minimal to no pain    Time  6    Period  Weeks    Status  Partially Met      PT LONG TERM GOAL #4   Title  Independent in HEP    Time  6    Period  Weeks    Status  On-going      PT LONG TERM GOAL #5   Title  Improve FOTO to </= 42% limitation    Time  6    Period  Weeks    Status  Achieved            Plan - 07/24/19 1259    Clinical Impression Statement  Pt continues with bilat knee pain and Rt quad weakness.  Reviewed current HEP and instructed pt in self-taping since he has some relief with this.  Pt requests to hold therapy for 3 wks, and plans to come back in for exercise progression.    Personal Factors and Comorbidities  Comorbidity 1;Comorbidity 2;Comorbidity 3+    Comorbidities  multiple orthopedic problems; multiple surgeries; AODM    Examination-Activity Limitations  Locomotion Level;Stand    Stability/Clinical Decision Making  Stable/Uncomplicated    Rehab Potential  Good    PT Frequency  2x / week    PT Duration  6 weeks    PT Treatment/Interventions  Cryotherapy;Electrical Stimulation;Iontophoresis 32m/ml Dexamethasone;Moist Heat;Ultrasound;Gait training;Stair training;Functional mobility training;Therapeutic activities;Therapeutic exercise;Balance training;Neuromuscular re-education;Patient/family education;Manual techniques;Dry needling;Taping    PT Next Visit Plan  progress HEP.    PT Home Exercise Plan  [id number]   Consulted and Agree with Plan of Care  Patient       Patient will benefit from skilled therapeutic intervention in order to improve the following deficits and impairments:  Abnormal gait, Decreased range of motion, Decreased activity tolerance, Pain, Decreased balance,  Decreased strength, Decreased mobility  Visit Diagnosis: Chronic pain of left knee  Weakness generalized  Abnormal posture     Problem List Patient Active Problem List   Diagnosis Date Noted  . Neck pain, bilateral 10/08/2018  . CKD (chronic kidney disease) stage 3, GFR 30-59 ml/min 10/08/2018  . Murmur, cardiac 10/08/2018  . DDD (degenerative disc disease), cervical 10/08/2018  . OAB (overactive bladder) 04/04/2018  . Seasonal allergic rhinitis due to pollen 11/01/2016  . Microalbuminuria due to type 2 diabetes mellitus ([place] 07/27/2016  . Renal cell carcinoma ([facility] 10/06/2015  . Varicose veins of both lower extremities 08/06/2015  . Varicose veins of lower extremities with ulcer ([place] 06/16/2015  . Other constipation 08/03/2014  . Internal hemorrhoid, bleeding 08/03/2014  . Post traumatic stress disorder (PTSD) 01/29/2014  . Deficiency anemia 12/10/2013  . Radiation proctitis 06/04/2012  . Spinal stenosis of lumbar region with radiculopathy 10/30/2011    Class: Chronic  . OSA (obstructive sleep apnea) 11/24/2010  . Obesity, Class I, BMI 30-34.9 11/24/2010  . Routine general medical examination at a health care facility 11/24/2010  . ADENOCARCINOMA, PROSTATE, GLEASON GRADE 6 12/21/2009  . Type II diabetes mellitus with manifestations ([place] 03/16/2007  . Hyperlipidemia with target LDL less than 100 03/16/2007  . Essential hypertension 03/16/2007  . GERD 03/16/2007   JKerin Perna PTA 07/24/19  1:06 PM  [facility] [place], [place], [address] Phone: [phone]   Fax:  [phone]  Name: Antonio Adams MRN: [id number] Date of Birth: 1945-12-29  PHYSICAL THERAPY DISCHARGE SUMMARY  Visits from Start of Care: 7  Current functional level related to goals / functional outcomes: See progress note for discharge status    Remaining deficits: Continued pain - some improvement in  symptoms    Education / Equipment: HEP   Plan: Patient agrees to discharge.  Patient goals were partially met. Patient is being discharged due to not returning since the last visit.  ?????     Celyn P. Helene Kelp PT, MPH 11/07/19 2:15 PM

## 2019-07-25 ENCOUNTER — Other Ambulatory Visit: Payer: Self-pay | Admitting: Internal Medicine

## 2019-07-25 DIAGNOSIS — I10 Essential (primary) hypertension: Secondary | ICD-10-CM

## 2019-07-25 DIAGNOSIS — E1129 Type 2 diabetes mellitus with other diabetic kidney complication: Secondary | ICD-10-CM

## 2019-07-25 DIAGNOSIS — E118 Type 2 diabetes mellitus with unspecified complications: Secondary | ICD-10-CM

## 2019-07-31 ENCOUNTER — Ambulatory Visit: Payer: Medicare Other | Admitting: Neurology

## 2019-08-19 ENCOUNTER — Encounter: Payer: Medicare Other | Admitting: Physical Therapy

## 2019-09-21 ENCOUNTER — Other Ambulatory Visit: Payer: Self-pay | Admitting: Internal Medicine

## 2019-09-21 DIAGNOSIS — E785 Hyperlipidemia, unspecified: Secondary | ICD-10-CM

## 2019-09-24 ENCOUNTER — Encounter: Payer: Self-pay | Admitting: Specialist

## 2019-09-24 ENCOUNTER — Ambulatory Visit (INDEPENDENT_AMBULATORY_CARE_PROVIDER_SITE_OTHER): Payer: Medicare Other | Admitting: Specialist

## 2019-09-24 ENCOUNTER — Other Ambulatory Visit: Payer: Self-pay

## 2019-09-24 VITALS — BP 155/90 | HR 72 | Ht 71.0 in | Wt 216.0 lb

## 2019-09-24 DIAGNOSIS — Z5321 Procedure and treatment not carried out due to patient leaving prior to being seen by health care provider: Secondary | ICD-10-CM

## 2019-10-14 ENCOUNTER — Other Ambulatory Visit: Payer: Self-pay | Admitting: Internal Medicine

## 2019-10-14 DIAGNOSIS — D508 Other iron deficiency anemias: Secondary | ICD-10-CM

## 2019-10-18 IMAGING — DX CHEST - 2 VIEW
2 series · 2 of 2 positions shown · non-contrast
Comparison: 01/07/2018

CLINICAL DATA: History of renal carcinoma

EXAM:
CHEST - 2 VIEW

[chest pa]
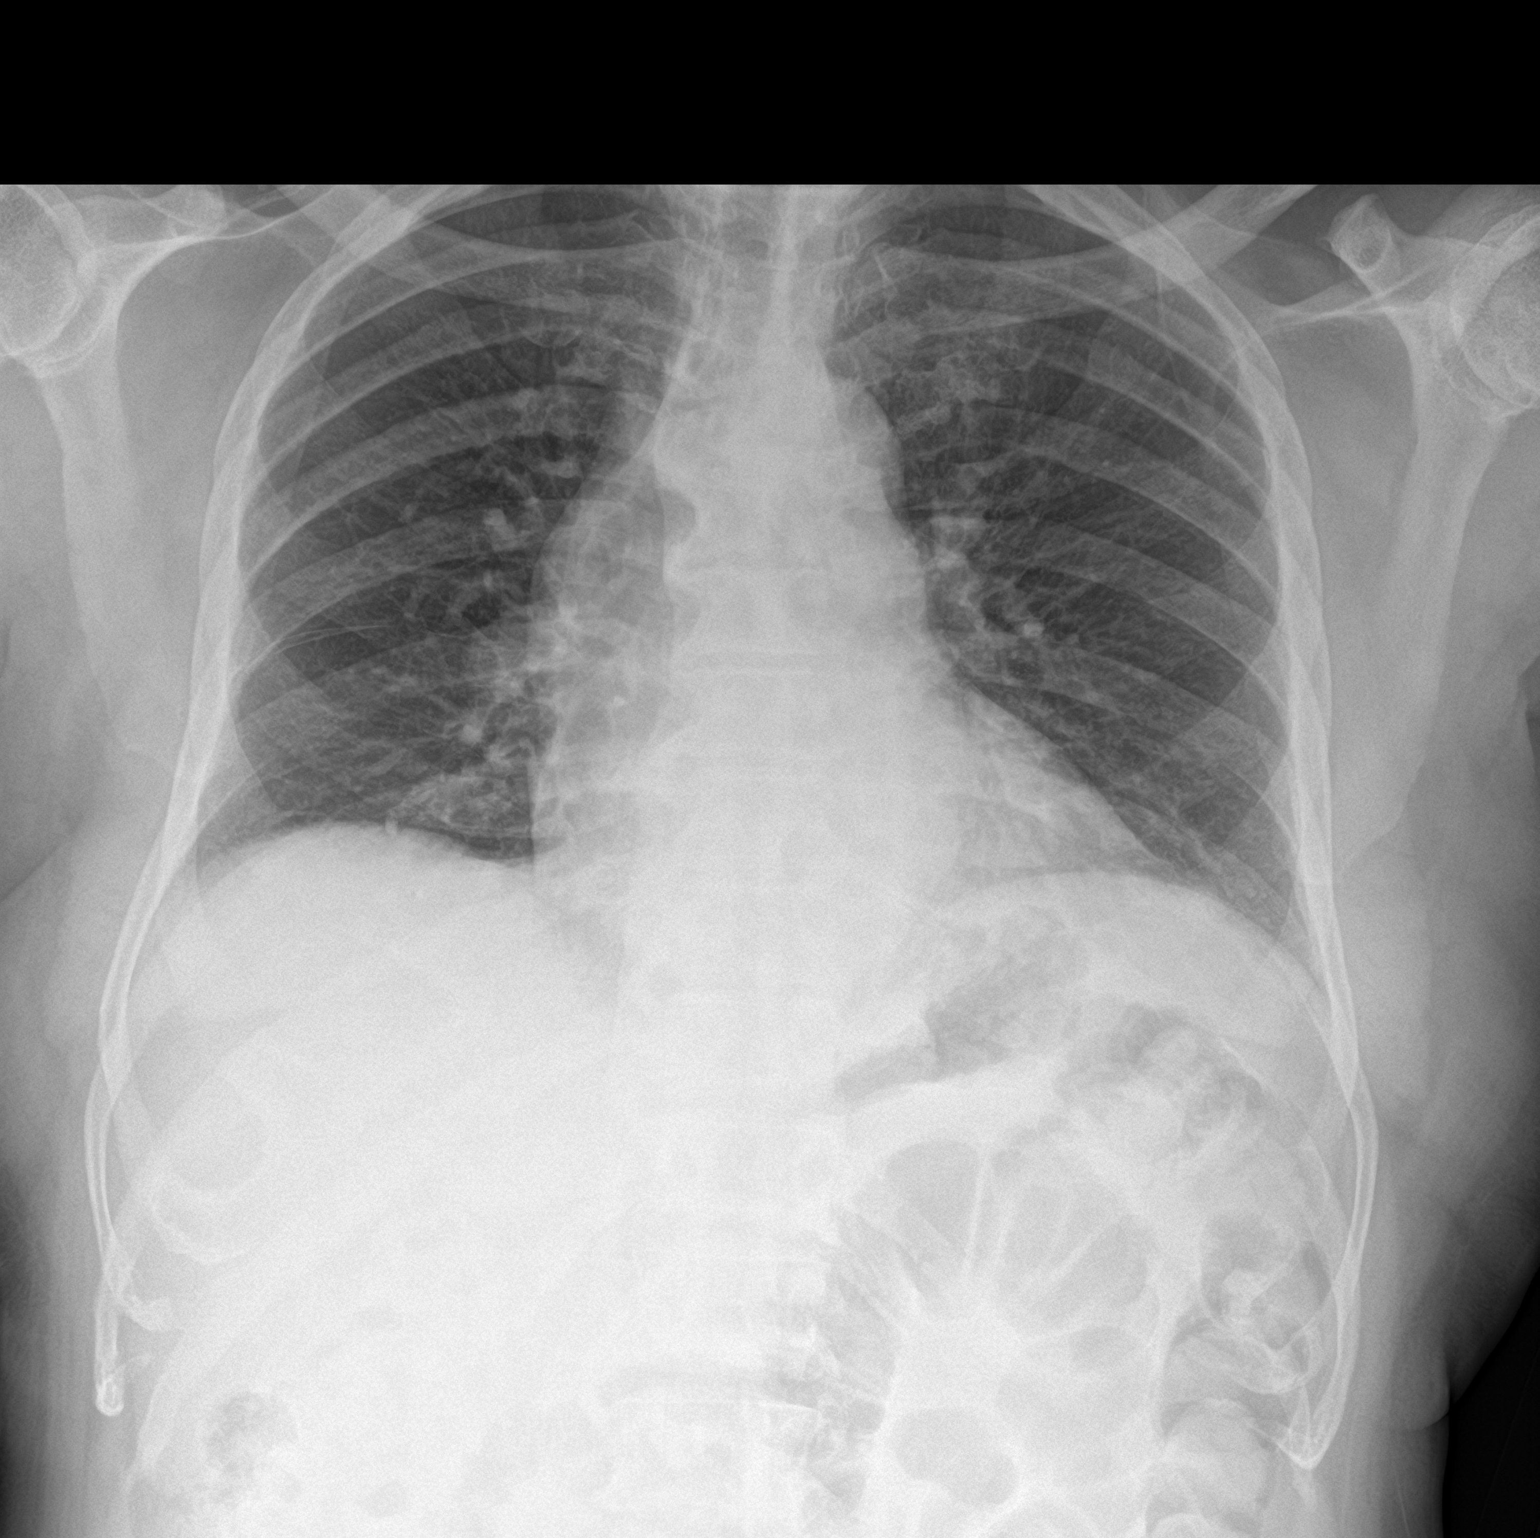

[chest lat]
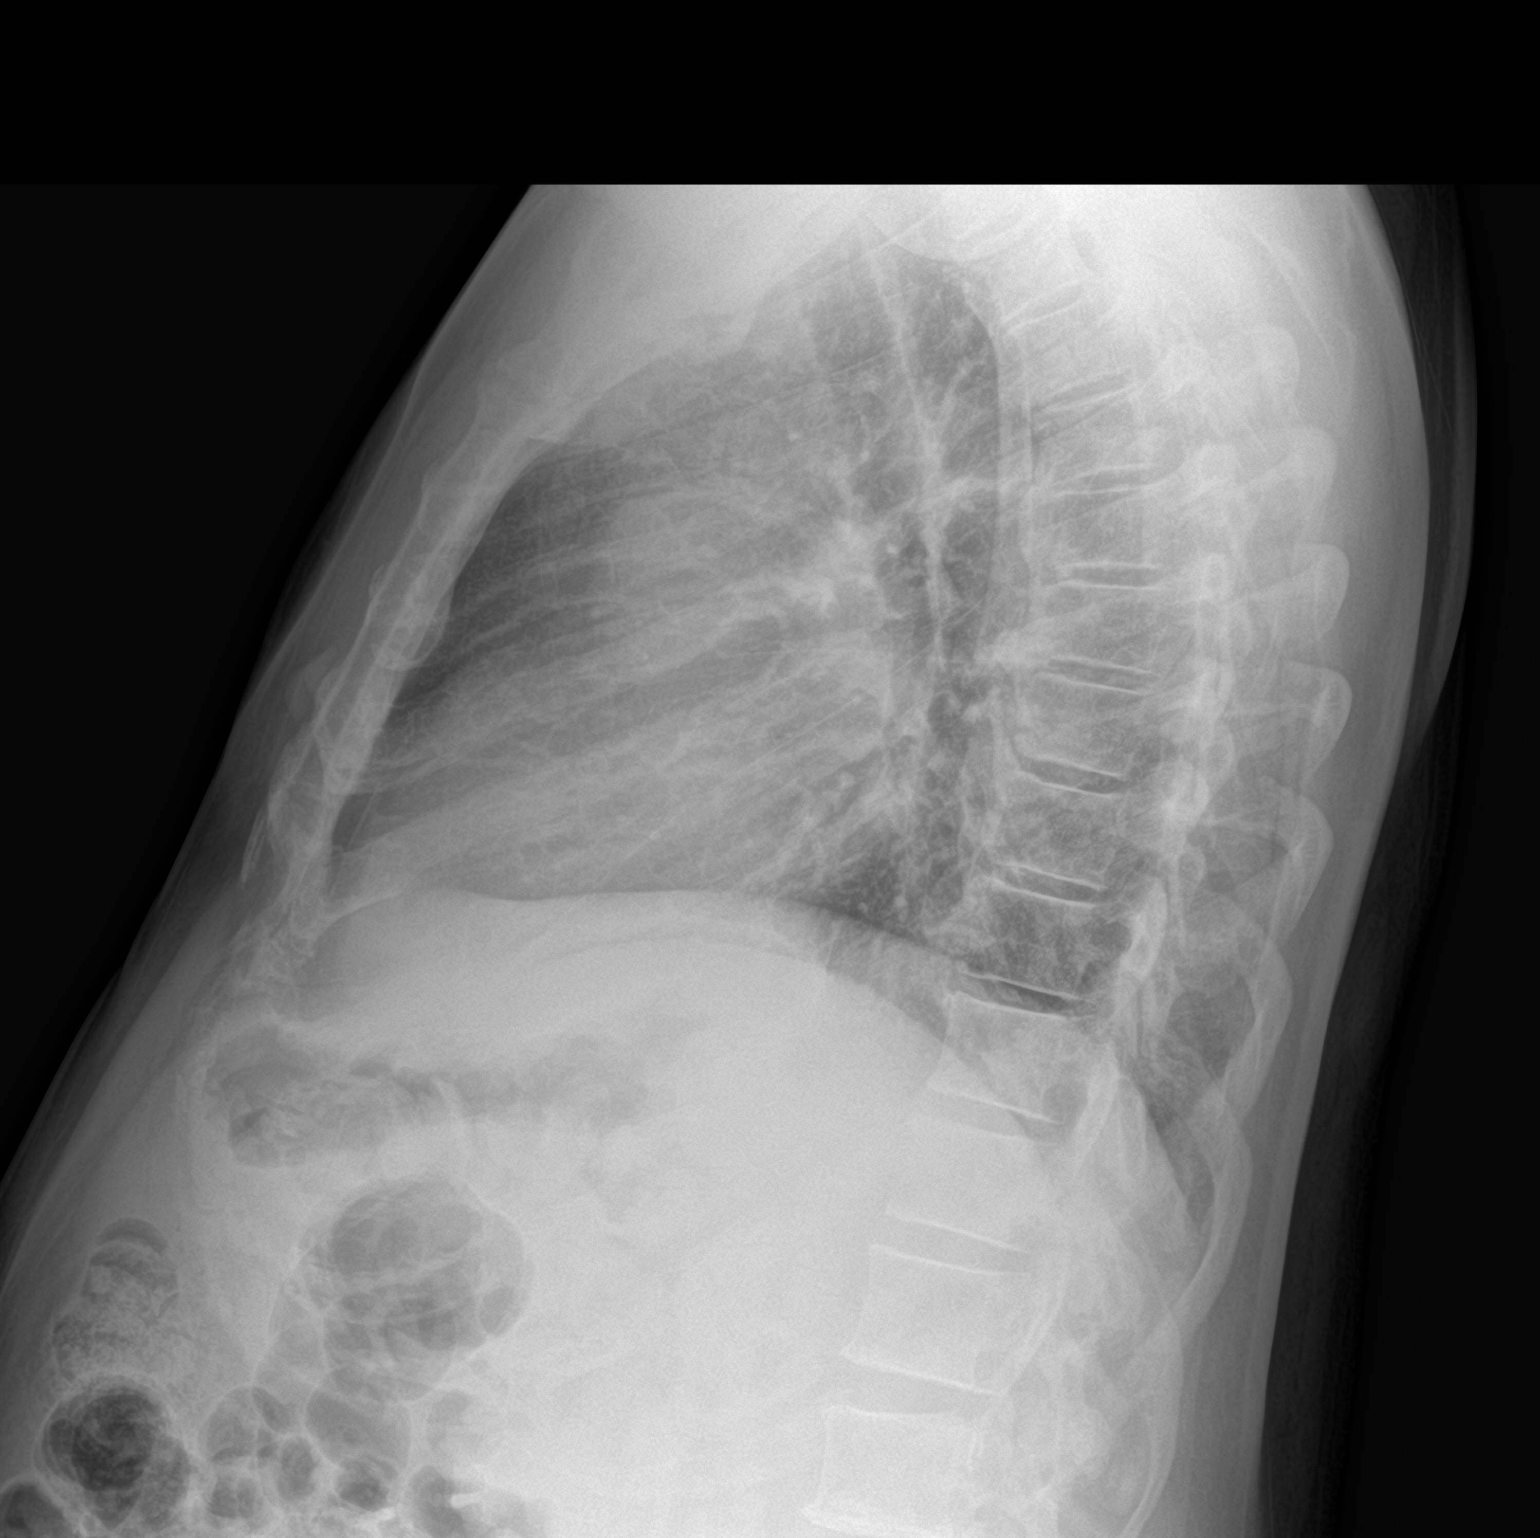

[2 of 2 positions shown; findings below may reference images not displayed]

FINDINGS: Cardiac shadow is stable. The lungs are well aerated bilaterally. No
focal infiltrate or sizable effusion is seen. No acute bony
abnormality is noted.
IMPRESSION: No active cardiopulmonary disease.

## 2019-10-31 ENCOUNTER — Ambulatory Visit: Payer: Medicare Other | Admitting: Specialist

## 2019-10-31 ENCOUNTER — Encounter: Payer: Self-pay | Admitting: Specialist

## 2019-10-31 ENCOUNTER — Other Ambulatory Visit: Payer: Self-pay

## 2019-10-31 VITALS — BP 138/80 | HR 71 | Ht 71.0 in | Wt 216.0 lb

## 2019-10-31 DIAGNOSIS — M19021 Primary osteoarthritis, right elbow: Secondary | ICD-10-CM

## 2019-10-31 DIAGNOSIS — M1711 Unilateral primary osteoarthritis, right knee: Secondary | ICD-10-CM | POA: Diagnosis not present

## 2019-10-31 DIAGNOSIS — M25561 Pain in right knee: Secondary | ICD-10-CM

## 2019-10-31 DIAGNOSIS — M25562 Pain in left knee: Secondary | ICD-10-CM | POA: Diagnosis not present

## 2019-10-31 DIAGNOSIS — M5136 Other intervertebral disc degeneration, lumbar region: Secondary | ICD-10-CM

## 2019-10-31 DIAGNOSIS — M51369 Other intervertebral disc degeneration, lumbar region without mention of lumbar back pain or lower extremity pain: Secondary | ICD-10-CM

## 2019-10-31 DIAGNOSIS — G8929 Other chronic pain: Secondary | ICD-10-CM

## 2019-10-31 NOTE — Patient Instructions (Signed)
The main ways of treat osteoarthritis, that are found to be success. Weight loss helps to decrease pain. Exercise is important to maintaining cartilage and thickness and strengthening. Do not takeNSAIDs like motrin, tylenol, alleve due to the history of kidney procedure and loss of part of the kidney. Ice is okay  In afternoon and evening and hot shower in the am

## 2019-10-31 NOTE — Progress Notes (Signed)
Office Visit Note   Patient: Antonio Adams           Date of Birth: January 01, 1946           MRN: [id number] Visit Date: 10/31/2019              Requested by: [name], MD [address] [place],  [place] [address] PCP: [name], MD   Assessment & Plan: Visit Diagnoses:  1. Left knee pain, unspecified chronicity   2. Localized primary osteoarthritis of right elbow   3. Arthritis of right knee   4. Chronic pain of right knee   5. Degenerative disc disease, lumbar     Plan: The main ways of treat osteoarthritis, that are found to be success. Weight loss helps to decrease pain. Exercise is important to maintaining cartilage and thickness and strengthening. Do not take NSAIDs like motrin, tylenol, alleve due to the history of kidney procedure and loss of part of the kidney. Ice is okay  In afternoon and evening and hot shower in the am  Follow-Up Instructions: Return in about 6 months (around 05/01/2020).   Orders:  No orders of the defined types were placed in this encounter.  No orders of the defined types were placed in this encounter.     Procedures: No procedures performed   Clinical Data: No additional findings.   Subjective: Chief Complaint  Patient presents with  . Lower Back - Follow-up  . Right Knee - Follow-up  . Left Knee - Follow-up    74 year old male with history of multilevel degenerative disc disease lumbar spine and bilateral knee  Patellofemoral arthrosis and pain with stair climbing and avoids squatting and kneeling. He is maintaining restrictions on  Lifting and carrying items. He is not interested in fusion right now. The knees are more painful than the back right now.  No NSAIDs due to previous renal surgery and partial nephrectomy for renal cell ca.    Review of Systems  Constitutional: Negative.   HENT: Negative.   Eyes: Negative.   Respiratory: Negative.   Cardiovascular: Negative.   Gastrointestinal: Negative.    Endocrine: Negative.   Genitourinary: Negative.   Musculoskeletal: Positive for arthralgias and back pain.  Skin: Negative.   Allergic/Immunologic: Negative.   Neurological: Negative.   Hematological: Negative.   Psychiatric/Behavioral: Negative.      Objective: Vital Signs: BP 138/80 (BP Location: Left Arm, Patient Position: Sitting)   Pulse 71   Ht 5\' 11"  (1.803 m)   Wt 216 lb (98 kg)   BMI 30.13 kg/m   Physical Exam Constitutional:      Appearance: He is well-developed.  HENT:     Head: Normocephalic and atraumatic.  Eyes:     Pupils: Pupils are equal, round, and reactive to light.  Pulmonary:     Effort: Pulmonary effort is normal.     Breath sounds: Normal breath sounds.  Abdominal:     General: Bowel sounds are normal.     Palpations: Abdomen is soft.  Musculoskeletal:     Cervical back: Normal range of motion and neck supple.     Lumbar back: Negative right straight leg raise test and negative left straight leg raise test.  Skin:    General: Skin is warm and dry.  Neurological:     Mental Status: He is alert and oriented to person, place, and time.  Psychiatric:        Behavior: Behavior normal.  Thought Content: Thought content normal.        Judgment: Judgment normal.     Back Exam   Tenderness  The patient is experiencing tenderness in the lumbar.  Range of Motion  Extension: abnormal  Flexion: abnormal  Lateral bend right: abnormal  Lateral bend left: abnormal  Rotation right: abnormal  Rotation left: abnormal   Muscle Strength  Right Quadriceps:  5/5  Left Quadriceps:  5/5  Right Hamstrings:  5/5  Left Hamstrings:  5/5   Tests  Straight leg raise right: negative Straight leg raise left: negative  Reflexes  Patellar: 1/4 Achilles: 1/4  Other  Toe walk: normal Heel walk: normal Sensation: normal Gait: normal  Erythema: no back redness Scars: absent      Specialty Comments:  No specialty comments  available.  Imaging: No results found.   PMFS History: Patient Active Problem List   Diagnosis Date Noted  . Spinal stenosis of lumbar region with radiculopathy 10/30/2011    Priority: High    Class: Chronic  . Neck pain, bilateral 10/08/2018  . CKD (chronic kidney disease) stage 3, GFR 30-59 ml/min 10/08/2018  . Murmur, cardiac 10/08/2018  . DDD (degenerative disc disease), cervical 10/08/2018  . OAB (overactive bladder) 04/04/2018  . Seasonal allergic rhinitis due to pollen 11/01/2016  . Microalbuminuria due to type 2 diabetes mellitus ([place]) 07/27/2016  . Renal cell carcinoma ([place]) 10/06/2015  . Varicose veins of both lower extremities 08/06/2015  . Varicose veins of lower extremities with ulcer ([place]) 06/16/2015  . Other constipation 08/03/2014  . Internal hemorrhoid, bleeding 08/03/2014  . Post traumatic stress disorder (PTSD) 01/29/2014  . Deficiency anemia 12/10/2013  . Radiation proctitis 06/04/2012  . OSA (obstructive sleep apnea) 11/24/2010  . Obesity, Class I, BMI 30-34.9 11/24/2010  . Routine general medical examination at a health care facility 11/24/2010  . ADENOCARCINOMA, PROSTATE, GLEASON GRADE 6 12/21/2009  . Type II diabetes mellitus with manifestations ([place]) 03/16/2007  . Hyperlipidemia with target LDL less than 100 03/16/2007  . Essential hypertension 03/16/2007  . GERD 03/16/2007   Past Medical History:  Diagnosis Date  . ADENOCARCINOMA, PROSTATE, GLEASON GRADE 6 12/21/2009   prostate cancer  . ALLERGIC RHINITIS 03/16/2007  . Allergy   . Anxiety   . Arthritis    back   . BACK PAIN WITH RADICULOPATHY 11/23/2008  . Cancer of kidney ([place])    partial right kidney removed  . CARPAL TUNNEL SYNDROME, BILATERAL 03/16/2007  . Chronic back pain   . Constipation    takes Senokot daily  . Depression    occasionally  . DIABETES MELLITUS, TYPE II 03/16/2007   takes Januvia and MEtformin daily  . GASTROINTESTINAL HEMORRHAGE, HX OF 03/16/2007  . GERD 03/16/2007    takes Omeprazole daily  . Glaucoma    mild - no eye drops  . Hemorrhoids   . History of colon polyps   . HYPERLIPIDEMIA 03/16/2007   takes Crestor daily  . HYPERTENSION 03/16/2007   takes Diltiazem and Lisinopril daily   . LEG CRAMPS 05/22/2007  . Nocturia   . Overactive bladder   . PARESTHESIA 05/21/2007  . Proctitis   . Rectal bleeding    Dr.Norins has explained its from the Radiation that he has received  . Rectal bleeding   . Sleep apnea    uses CPAP nightly  . Stomach cancer ([place])   . Urinary frequency    takes Toviaz daily    Family History  Problem Relation Age of Onset  .  Cancer Sister 30       Cancer, unsure type  . Hypertension Other   . Colon cancer Neg Hx   . Esophageal cancer Neg Hx   . Stomach cancer Neg Hx   . Diabetes Neg Hx   . Colon polyps Neg Hx   . Rectal cancer Neg Hx     Past Surgical History:  Procedure Laterality Date  . [place], 2008   repeat surgery; ESI '08  . CARPAL TUNNEL RELEASE     bilateral  . CERVICAL FUSION    . COLONOSCOPY    . colonosocpy    . ESOPHAGOGASTRODUODENOSCOPY     ED  . FLEXIBLE SIGMOIDOSCOPY N/A 01/08/2017   Procedure: FLEXIBLE SIGMOIDOSCOPY;  Surgeon: Irene Shipper, MD;  Location: [facility] ENDOSCOPY;  Service: Endoscopy;  Laterality: N/A;  . HOT HEMOSTASIS N/A 01/08/2017   Procedure: HOT HEMOSTASIS (ARGON PLASMA COAGULATION/BICAP);  Surgeon: Irene Shipper, MD;  Location: [facility] ENDOSCOPY;  Service: Endoscopy;  Laterality: N/A;  . LUMBAR LAMINECTOMY  10/30/2011   Procedure: MICRODISCECTOMY LUMBAR LAMINECTOMY;  Surgeon: Jessy Oto, MD;  Location: [place];  Service: Orthopedics;  Laterality: Right;  Right L4-5 and L5-S1 Microdiscectomy  . LUMBAR LAMINECTOMY  06/17/2012   Procedure: MICRODISCECTOMY LUMBAR LAMINECTOMY;  Surgeon: Jessy Oto, MD;  Location: [place];  Service: Orthopedics;  Laterality: N/A;  Right L5-S1 microdiscectomy  . POLYPECTOMY    . PROSTATE SURGERY  march 2012   seed implant  . ROBOTIC ASSITED PARTIAL  NEPHRECTOMY Right 09/08/2015   Procedure: XI ROBOTIC ASSITED RIGHT PARTIAL NEPHRECTOMY;  Surgeon: [name], MD;  [facility];  Service: Urology;  Laterality: Right;  Clamp on: 1033 Clamp off: 1103 Total Clamp Time: 30 minutes  . SHOULDER ARTHROSCOPY WITH SUBACROMIAL DECOMPRESSION Left 01/20/2016   Procedure: LEFT SHOULDER ARTHROSCOPY WITH EXTENSIVE  DEBRIDEMENT, ACROMIOPLASTY;  Surgeon: Ninetta Lights, MD;  Location: [place];  Service: Orthopedics;  Laterality: Left;  . SHOULDER SURGERY  LFT  . Stress Cardiolite  12/24/2003   negative for ischemia  . UPPER ESOPHAGEAL ENDOSCOPIC ULTRASOUND (EUS)  04/18/2016   [facility]  . UPPER GASTROINTESTINAL ENDOSCOPY    . WOUND EXPLORATION     management of a wound that he sustained in the [place] History  . 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 Smoking status: Never Smoker  . Smokeless tobacco: Never Used  Vaping Use  . Vaping Use: Never used  Substance and Sexual Activity  . Alcohol use: Not Currently    Alcohol/week: 0.0 standard drinks    Comment: occasional  . Drug use: No  . Sexual activity: Not Currently    Partners: Female

## 2019-11-04 LAB — HM DIABETES EYE EXAM

## 2019-11-07 DIAGNOSIS — Z85528 Personal history of other malignant neoplasm of kidney: Secondary | ICD-10-CM | POA: Diagnosis not present

## 2019-11-11 ENCOUNTER — Ambulatory Visit (HOSPITAL_COMMUNITY)
Admission: RE | Admit: 2019-11-11 | Discharge: 2019-11-11 | Disposition: A | Discharge status: Home / Self Care | Payer: Medicare Other | Source: Ambulatory Visit | Attending: Urology | Admitting: Urology

## 2019-11-11 ENCOUNTER — Other Ambulatory Visit (HOSPITAL_COMMUNITY): Payer: Self-pay | Admitting: Urology

## 2019-11-11 ENCOUNTER — Other Ambulatory Visit: Payer: Self-pay

## 2019-11-11 DIAGNOSIS — Z85528 Personal history of other malignant neoplasm of kidney: Secondary | ICD-10-CM

## 2019-11-11 DIAGNOSIS — C641 Malignant neoplasm of right kidney, except renal pelvis: Secondary | ICD-10-CM | POA: Diagnosis not present

## 2019-11-11 DIAGNOSIS — K8020 Calculus of gallbladder without cholecystitis without obstruction: Secondary | ICD-10-CM | POA: Diagnosis not present

## 2019-11-13 DIAGNOSIS — C641 Malignant neoplasm of right kidney, except renal pelvis: Secondary | ICD-10-CM | POA: Diagnosis not present

## 2019-11-21 ENCOUNTER — Emergency Department (HOSPITAL_BASED_OUTPATIENT_CLINIC_OR_DEPARTMENT_OTHER)
Admission: EM | Admit: 2019-11-21 | Discharge: 2019-11-21 | Disposition: A | Discharge status: Home / Self Care | Payer: Medicare Other | Attending: Emergency Medicine | Admitting: Emergency Medicine

## 2019-11-21 ENCOUNTER — Encounter (HOSPITAL_BASED_OUTPATIENT_CLINIC_OR_DEPARTMENT_OTHER): Payer: Self-pay | Admitting: Emergency Medicine

## 2019-11-21 ENCOUNTER — Other Ambulatory Visit: Payer: Self-pay

## 2019-11-21 ENCOUNTER — Other Ambulatory Visit (HOSPITAL_COMMUNITY): Payer: Self-pay | Admitting: Urology

## 2019-11-21 DIAGNOSIS — Z7982 Long term (current) use of aspirin: Secondary | ICD-10-CM | POA: Diagnosis not present

## 2019-11-21 DIAGNOSIS — N183 Chronic kidney disease, stage 3 unspecified: Secondary | ICD-10-CM | POA: Diagnosis not present

## 2019-11-21 DIAGNOSIS — Z79899 Other long term (current) drug therapy: Secondary | ICD-10-CM | POA: Diagnosis not present

## 2019-11-21 DIAGNOSIS — E119 Type 2 diabetes mellitus without complications: Secondary | ICD-10-CM | POA: Insufficient documentation

## 2019-11-21 DIAGNOSIS — Z7984 Long term (current) use of oral hypoglycemic drugs: Secondary | ICD-10-CM | POA: Diagnosis not present

## 2019-11-21 DIAGNOSIS — Z85028 Personal history of other malignant neoplasm of stomach: Secondary | ICD-10-CM | POA: Diagnosis not present

## 2019-11-21 DIAGNOSIS — Z91018 Allergy to other foods: Secondary | ICD-10-CM | POA: Insufficient documentation

## 2019-11-21 DIAGNOSIS — Z85528 Personal history of other malignant neoplasm of kidney: Secondary | ICD-10-CM | POA: Diagnosis not present

## 2019-11-21 DIAGNOSIS — T783XXA Angioneurotic edema, initial encounter: Secondary | ICD-10-CM | POA: Insufficient documentation

## 2019-11-21 DIAGNOSIS — I129 Hypertensive chronic kidney disease with stage 1 through stage 4 chronic kidney disease, or unspecified chronic kidney disease: Secondary | ICD-10-CM | POA: Diagnosis not present

## 2019-11-21 DIAGNOSIS — C641 Malignant neoplasm of right kidney, except renal pelvis: Secondary | ICD-10-CM

## 2019-11-21 HISTORY — DX: Malignant neoplasm of unspecified kidney, except renal pelvis: C64.9

## 2019-11-21 LAB — CBC WITH DIFFERENTIAL/PLATELET
Abs Immature Granulocytes: 0.01 10*3/uL (ref 0.00–0.07)
Basophils Absolute: 0 10*3/uL (ref 0.0–0.1)
Basophils Relative: 1 %
Eosinophils Absolute: 0.1 10*3/uL (ref 0.0–0.5)
Eosinophils Relative: 2 %
HCT: 29.5 % — ABNORMAL LOW (ref 39.0–52.0)
Hemoglobin: 9.9 g/dL — ABNORMAL LOW (ref 13.0–17.0)
Immature Granulocytes: 0 %
Lymphocytes Relative: 26 %
Lymphs Abs: 0.8 10*3/uL (ref 0.7–4.0)
MCH: 29.7 pg (ref 26.0–34.0)
MCHC: 33.6 g/dL (ref 30.0–36.0)
MCV: 88.6 fL (ref 80.0–100.0)
Monocytes Absolute: 0.3 10*3/uL (ref 0.1–1.0)
Monocytes Relative: 11 %
Neutro Abs: 1.8 10*3/uL (ref 1.7–7.7)
Neutrophils Relative %: 60 %
Platelets: 209 10*3/uL (ref 150–400)
RBC: 3.33 MIL/uL — ABNORMAL LOW (ref 4.22–5.81)
RDW: 14.3 % (ref 11.5–15.5)
WBC: 3 10*3/uL — ABNORMAL LOW (ref 4.0–10.5)
nRBC: 0 % (ref 0.0–0.2)

## 2019-11-21 LAB — BASIC METABOLIC PANEL
Anion gap: 8 (ref 5–15)
BUN: 31 mg/dL — ABNORMAL HIGH (ref 8–23)
CO2: 21 mmol/L — ABNORMAL LOW (ref 22–32)
Calcium: 9.6 mg/dL (ref 8.9–10.3)
Chloride: 105 mmol/L (ref 98–111)
Creatinine, Ser: 2.17 mg/dL — ABNORMAL HIGH (ref 0.61–1.24)
GFR calc Af Amer: 34 mL/min — ABNORMAL LOW (ref >60)
GFR calc non Af Amer: 29 mL/min — ABNORMAL LOW (ref >60)
Glucose, Bld: 107 mg/dL — ABNORMAL HIGH (ref 70–99)
Potassium: 4.7 mmol/L (ref 3.5–5.1)
Sodium: 134 mmol/L — ABNORMAL LOW (ref 135–145)

## 2019-11-21 MED ORDER — DEXAMETHASONE SODIUM PHOSPHATE 10 MG/ML IJ SOLN
10.0000 mg | Freq: Once | INTRAMUSCULAR | Status: AC
  Administered 2019-11-21: 10 mg via INTRAVENOUS
  Filled 2019-11-21: qty 1

## 2019-11-21 MED ORDER — SODIUM CHLORIDE 0.9 % IV SOLN
INTRAVENOUS | Status: DC | PRN
  Administered 2019-11-21: 250 mL via INTRAVENOUS

## 2019-11-21 MED ORDER — FAMOTIDINE IN NACL 20-0.9 MG/50ML-% IV SOLN
20.0000 mg | Freq: Once | INTRAVENOUS | Status: AC
  Administered 2019-11-21: 20 mg via INTRAVENOUS
  Filled 2019-11-21: qty 50

## 2019-11-21 MED ORDER — PREDNISONE 50 MG PO TABS: 60.0000 mg | ORAL_TABLET | Freq: Once | ORAL | Status: DC

## 2019-11-21 MED ORDER — FAMOTIDINE 20 MG PO TABS: 20.0000 mg | ORAL_TABLET | Freq: Once | ORAL | Status: DC

## 2019-11-21 NOTE — Discharge Instructions (Signed)
As we discussed, your symptoms are most likely caused by the medication lisinopril.  You should not be on this medication anymore as it can cause swelling to tongue or lips.  Follow-up with your primary care doctor in the next 2 to 3 days for further evaluation and review of your medications.  Return to emergency department for any worsening swelling, swelling of your tongue, difficulty breathing, vomiting or any other worsening or concerning symptoms.

## 2019-11-21 NOTE — ED Provider Notes (Signed)
[facility] EMERGENCY DEPARTMENT Provider Note   CSN: [id number] Arrival date & time: 11/21/19  1635     History Chief Complaint  Patient presents with   Allergic Reaction    Antonio Adams is a 74 y.o. male past medical history of adenocarcinoma prostate, chronic back pain, GERD, hypertension who presents for evaluation of upper lip swelling that began about 12 PM this afternoon.  Patient reports eating blueberries prior to onset of swelling.  He states that he has no known history of allergy to blueberries and has tolerated them without any difficulty before.  He states that he took Benadryl at home but the lip swelling persisted, prompting ED visit.  He states that this is happened to him once before several years ago.  He states that he was told to stop a medication that they thought was contributing to the symptoms.  He states that he does not recall what the name of it is.  He states he has not had any new medications recently.  No known history of allergies.  He states he did not eat any abnormal foods.  No new soaps, lotions, detergents.  He states he has not had any difficulty breathing, vomiting, chest pain, tongue swelling.   The history is provided by the patient.       Past Medical History:  Diagnosis Date   ADENOCARCINOMA, PROSTATE, GLEASON GRADE 6 12/21/2009   prostate cancer   ALLERGIC RHINITIS 03/16/2007   Allergy    Anxiety    Arthritis    back    BACK PAIN WITH RADICULOPATHY 11/23/2008   Cancer of kidney ([place])    partial right kidney removed   CARPAL TUNNEL SYNDROME, BILATERAL 03/16/2007   Chronic back pain    Constipation    takes Senokot daily   Depression    occasionally   DIABETES MELLITUS, TYPE II 03/16/2007   takes Januvia and MEtformin daily   GASTROINTESTINAL HEMORRHAGE, HX OF 03/16/2007   GERD 03/16/2007   takes Omeprazole daily   Glaucoma    mild - no eye drops   Hemorrhoids    History of colon polyps     HYPERLIPIDEMIA 03/16/2007   takes Crestor daily   HYPERTENSION 03/16/2007   takes Diltiazem and Lisinopril daily    Kidney cancer, primary, with metastasis from kidney to other site [facility])    LEG CRAMPS 05/22/2007   Nocturia    Overactive bladder    PARESTHESIA 05/21/2007   Proctitis    Rectal bleeding    Dr.Norins has explained its from the Radiation that he has received   Rectal bleeding    Sleep apnea    uses CPAP nightly   Stomach cancer ([place])    Urinary frequency    takes Toviaz daily    Patient Active Problem List   Diagnosis Date Noted   Neck pain, bilateral 10/08/2018   CKD (chronic kidney disease) stage 3, GFR 30-59 ml/min 10/08/2018   Murmur, cardiac 10/08/2018   DDD (degenerative disc disease), cervical 10/08/2018   OAB (overactive bladder) 04/04/2018   Seasonal allergic rhinitis due to pollen 11/01/2016   Microalbuminuria due to type 2 diabetes mellitus ([place]) 07/27/2016   Renal cell carcinoma ([place]) 10/06/2015   Varicose veins of both lower extremities 08/06/2015   Varicose veins of lower extremities with ulcer ([place]) 06/16/2015   Other constipation 08/03/2014   Internal hemorrhoid, bleeding 08/03/2014   Post traumatic stress disorder (PTSD) 01/29/2014   Deficiency anemia 12/10/2013   Radiation proctitis 06/04/2012  Spinal stenosis of lumbar region with radiculopathy 10/30/2011    Class: Chronic   OSA (obstructive sleep apnea) 11/24/2010   Obesity, Class I, BMI 30-34.9 11/24/2010   Routine general medical examination at a health care facility 11/24/2010   ADENOCARCINOMA, PROSTATE, GLEASON GRADE 6 12/21/2009   Type II diabetes mellitus with manifestations ([place]) 03/16/2007   Hyperlipidemia with target LDL less than 100 03/16/2007   Essential hypertension 03/16/2007   GERD 03/16/2007    Past Surgical History:  Procedure Laterality Date   [place], 2008   repeat surgery; ESI '08   [place]     bilateral    CERVICAL FUSION     COLONOSCOPY     colonosocpy     ESOPHAGOGASTRODUODENOSCOPY     ED   FLEXIBLE SIGMOIDOSCOPY N/A 01/08/2017   Procedure: FLEXIBLE SIGMOIDOSCOPY;  Surgeon: Irene Shipper, MD;  Location: [facility];  Service: Endoscopy;  Laterality: N/A;   HOT HEMOSTASIS N/A 01/08/2017   Procedure: HOT HEMOSTASIS (ARGON PLASMA COAGULATION/BICAP);  Surgeon: Irene Shipper, MD;  [facility] ENDOSCOPY;  Service: Endoscopy;  Laterality: N/A;   LUMBAR LAMINECTOMY  10/30/2011   Procedure: MICRODISCECTOMY LUMBAR LAMINECTOMY;  Surgeon: Jessy Oto, MD;  Location: [facility];  Service: Orthopedics;  Laterality: Right;  Right L4-5 and L5-S1 Microdiscectomy   LUMBAR LAMINECTOMY  06/17/2012   Procedure: MICRODISCECTOMY LUMBAR LAMINECTOMY;  Surgeon: Jessy Oto, MD;  Location: [facility];  Service: Orthopedics;  Laterality: N/A;  Right L5-S1 microdiscectomy   POLYPECTOMY     PROSTATE SURGERY  march 2012   seed implant   ROBOTIC ASSITED PARTIAL NEPHRECTOMY Right 09/08/2015   Procedure: XI ROBOTIC ASSITED RIGHT PARTIAL NEPHRECTOMY;  Surgeon: [name], MD;  Location: [facility];  Service: Urology;  Laterality: Right;  Clamp on: 1033 Clamp off: 1103 Total Clamp Time: 30 minutes   SHOULDER ARTHROSCOPY WITH SUBACROMIAL DECOMPRESSION Left 01/20/2016   Procedure: LEFT SHOULDER ARTHROSCOPY WITH EXTENSIVE  DEBRIDEMENT, ACROMIOPLASTY;  Surgeon: Ninetta Lights, MD;  Location: [facility];  Service: Orthopedics;  Laterality: Left;   SHOULDER SURGERY  LFT   Stress Cardiolite  12/24/2003   negative for ischemia   UPPER ESOPHAGEAL ENDOSCOPIC ULTRASOUND (EUS)  04/18/2016   [facility]   UPPER GASTROINTESTINAL ENDOSCOPY     WOUND EXPLORATION     management of a wound that he sustained in the [place]       Family History  Problem Relation Age of Onset   Cancer Sister 14       Cancer, unsure type   Hypertension Other    Colon cancer Neg Hx    Esophageal cancer Neg Hx     Stomach cancer Neg Hx    Diabetes Neg Hx    Colon polyps Neg Hx    Rectal cancer Neg Hx     Social History   Tobacco Use   Smoking status: Never Smoker   Smokeless tobacco: Never Used  Vaping Use   Vaping Use: Never used  Substance Use Topics   Alcohol use: Not Currently    Alcohol/week: 0.0 standard drinks    Comment: occasional   Drug use: No    Home Medications Prior to Admission medications   Medication Sig Start Date End Date Taking? Authorizing Provider  aspirin EC 81 MG tablet Take 81 mg by mouth daily.    [provider]  atorvastatin (LIPITOR) 40 MG tablet TAKE 1 TABLET BY MOUTH EVERY DAY 09/22/19   Jodi Mourning  Jasmine December, FNP  cholecalciferol (VITAMIN D) 1000 units tablet Take 1,000 Units by mouth daily.    [provider]  citalopram (CELEXA) 40 MG tablet Take 1 tablet by mouth daily. 11/16/16   [provider]  clotrimazole-betamethasone (LOTRISONE) cream APPLY TO AFFECTED AREA TWICE A DAY 10/14/19   [name], MD  Continuous Blood Gluc Receiver (FREESTYLE LIBRE 14 DAY READER) DEVI 1 Act by Does not apply route daily. 07/01/19   [name], MD  Continuous Blood Gluc Sensor (FREESTYLE LIBRE 14 DAY SENSOR) MISC USE AS DIRECTED WITH READER 07/02/19   [name], MD  diclofenac Sodium (VOLTAREN) 1 % GEL Apply 4 g topically 4 (four) times daily. 06/24/19   Jessy Oto, MD  famotidine (PEPCID) 20 MG tablet Take 1 tablet (20 mg total) by mouth 2 (two) times daily. 03/23/19   Isla Pence, MD  ferrous sulfate 325 (65 FE) MG tablet Take 1 tablet (325 mg total) by mouth 2 (two) times daily. Patient needs office visit for further refills 02/24/19   Irene Shipper, MD  fluticasone [facility]) 50 MCG/ACT nasal spray Place 2 sprays into both nostrils daily. Patient not taking: Reported on 07/14/2019 09/27/18   Marrian Salvage, FNP  gabapentin (NEURONTIN) 300 MG capsule Take 1 capsule (300 mg total) by mouth at bedtime. 08/19/18   Jessy Oto, MD  hydrOXYzine (ATARAX/VISTARIL) 25 MG tablet  10/03/18   [provider]  Insulin Pen Needle (NOVOFINE) 32G X 6 MM MISC 1 Act by Does not apply route daily. USE TID 07/04/19   [name], MD  LANTUS SOLOSTAR 100 UNIT/ML Solostar Pen INJECT 50 UNITS INTO THE SKIN EVERY MORNING. 04/21/19   [name], MD  meclizine (ANTIVERT) 12.5 MG tablet Take 1 tablet (12.5 mg total) by mouth 3 (three) times daily as needed for dizziness. Patient not taking: Reported on 07/14/2019 09/27/18   Marrian Salvage, FNP  mirabegron ER (MYRBETRIQ) 25 MG TB24 tablet Take 1 tablet (25 mg total) by mouth daily. 04/04/18   [name], MD  omeprazole (PRILOSEC) 40 MG capsule TAKE ONE CAPSULE EVERY DAY 02/24/13   Norins, Heinz Knuckles, MD  ONE TOUCH ULTRA TEST test strip USE 2 (TWO) TIMES DAILY. AND LANCETS 2/DAY 250.03 11/05/16   [name], MD  SitaGLIPtin-MetFORMIN HCl (JANUMET XR) 50-1000 MG TB24 Take 1 tablet by mouth daily. 07/06/19   [name], MD  tamsulosin (FLOMAX) 0.4 MG CAPS capsule TAKE 1 CAPSULE (0.4 MG TOTAL) BY MOUTH DAILY. 01/20/14   [name], MD  telmisartan (MICARDIS) 40 MG tablet TAKE 1 TABLET BY MOUTH EVERY DAY 07/25/19   [name], MD  traMADol (ULTRAM) 50 MG tablet Take 1 tablet (50 mg total) by mouth every 12 (twelve) hours as needed. 12/13/18   Lanae Crumbly, PA-C    Allergies    Ace inhibitors  Review of Systems   Review of Systems  Constitutional: Negative for fever.  HENT: Positive for facial swelling.   Respiratory: Negative for cough and shortness of breath.   Cardiovascular: Negative for chest pain.  Gastrointestinal: Negative for abdominal pain, nausea and vomiting.  Genitourinary: Negative for dysuria and hematuria.  Neurological: Negative for headaches.  All other systems reviewed and are negative.   Physical Exam Updated Vital Signs BP (!) 157/78 (BP Location: Left Arm)    Pulse 60    Temp 98.5 F (36.9 C) (Oral)    Resp 18  Ht  5\' 11"  (1.803 m)    Wt 95.4 kg    SpO2 98%    BMI 29.34 kg/m   Physical Exam Vitals and nursing note reviewed.  Constitutional:      Appearance: Normal appearance. He is well-developed.  HENT:     Head: Normocephalic and atraumatic.     Mouth/Throat:     Mouth: Angioedema present.     Comments: Angioedema of upper lip present.  No tongue or intraoral swelling.  Uvula is midline.  Airways patent.  Phonation is intact.  No focal swelling noted to floor of mouth.  Dispersed dental caries.  No evidence of dental abscess. Eyes:     General: Lids are normal.     Conjunctiva/sclera: Conjunctivae normal.     Pupils: Pupils are equal, round, and reactive to light.  Neck:     Comments: Neck is without edema, crepitus. Cardiovascular:     Rate and Rhythm: Normal rate and regular rhythm.     Pulses: Normal pulses.     Heart sounds: Normal heart sounds. No murmur heard.  No friction rub. No gallop.   Pulmonary:     Effort: Pulmonary effort is normal.     Breath sounds: Normal breath sounds.     Comments: Lungs clear to auscultation bilaterally.  Symmetric chest rise.  No wheezing, rales, rhonchi. Abdominal:     Palpations: Abdomen is soft. Abdomen is not rigid.     Tenderness: There is no abdominal tenderness. There is no guarding.  Musculoskeletal:        General: Normal range of motion.     Cervical back: Full passive range of motion without pain.  Skin:    General: Skin is warm and dry.     Capillary Refill: Capillary refill takes less than 2 seconds.  Neurological:     Mental Status: He is alert and oriented to person, place, and time.  Psychiatric:        Speech: Speech normal.     ED Results / Procedures / Treatments   Labs (all labs ordered are listed, but only abnormal results are displayed) Labs Reviewed  BASIC METABOLIC PANEL  CBC WITH DIFFERENTIAL/PLATELET    EKG None  Radiology No results found.  Procedures Procedures (including critical care  time)  Medications Ordered in ED Medications  0.9 %  sodium chloride infusion (250 mLs Intravenous New Bag/Given 11/21/19 1728)  famotidine (PEPCID) IVPB 20 mg premix ( Intravenous Stopped 11/21/19 1810)  dexamethasone (DECADRON) injection 10 mg (10 mg Intravenous Given 11/21/19 1728)    ED Course  I have reviewed the triage vital signs and the nursing notes.  Pertinent labs & imaging results that were available during my care of the patient were reviewed by me and considered in my medical decision making (see chart for details).    MDM Rules/Calculators/A&P                          74 year old male who presents for evaluation of angioedema noted to upper lip that began at 76 PM.  States this is happened before but unsure of why it happened.  He states it may have been due to a medication but cannot recall the name of it.  On initially arrival, he is afebrile, nontoxic-appearing.  Vital signs are stable.  On exam, he does have oral angioedema of the upper lip.  No tongue swelling.  Airways patent, phonation is intact.  No evidence of respiratory  distress.  History/physical exam is not concerning for Ludwig angina or peritonsillar abscess.  I reviewed his medications.  Despite having an allergy listed to ACE inhibitor's, he has prescriptions for lisinopril that were filled in June 2020 and June 2021.  He does report that he has been taking them.  I suspect this is lisinopril induced angioedema. Will plan to monitor.   I reviewed his records.  Patient was seen in the ED in May 2017 for angioedema.  At that time, it was thought to be due to lisinopril.  Reevaluation: Patient is hemodynamically stable.  He has not had any worsening of the angioedema of the lip but is not any improvement as well.  He still denies any difficulty breathing, vomiting.  We will plan to continue monitor.  Patient signed out to Dr. Laverta Baltimore pending re-evaluation.   Portions of this note were generated with Theatre manager. Dictation errors may occur despite best attempts at proofreading.   Final Clinical Impression(s) / ED Diagnoses Final diagnoses:  Angioedema, initial encounter    Rx / DC Orders ED Discharge Orders    None       Desma Mcgregor 11/21/19 1845    Margette Fast, MD 11/21/19 2057

## 2019-11-21 NOTE — ED Notes (Signed)
Pt. Aware of which med not to take any longer.

## 2019-11-21 NOTE — ED Triage Notes (Addendum)
Swelling to upper lip since noon today. Has had a reaction like this in years past once but does not know what med caused it.  Sts he had his 1st Covid vaccine 6 days ago.  Took Benadryl 25 mg about 90 min ago

## 2019-11-26 DIAGNOSIS — C641 Malignant neoplasm of right kidney, except renal pelvis: Secondary | ICD-10-CM | POA: Diagnosis not present

## 2019-11-27 ENCOUNTER — Ambulatory Visit (HOSPITAL_COMMUNITY)
Admission: RE | Admit: 2019-11-27 | Discharge: 2019-11-27 | Disposition: A | Discharge status: Home / Self Care | Payer: Medicare Other | Source: Ambulatory Visit | Attending: Urology | Admitting: Urology

## 2019-11-27 ENCOUNTER — Other Ambulatory Visit: Payer: Self-pay

## 2019-11-27 DIAGNOSIS — E278 Other specified disorders of adrenal gland: Secondary | ICD-10-CM | POA: Diagnosis not present

## 2019-11-27 DIAGNOSIS — C641 Malignant neoplasm of right kidney, except renal pelvis: Secondary | ICD-10-CM | POA: Insufficient documentation

## 2019-11-27 DIAGNOSIS — K8689 Other specified diseases of pancreas: Secondary | ICD-10-CM | POA: Diagnosis not present

## 2019-11-27 DIAGNOSIS — Q453 Other congenital malformations of pancreas and pancreatic duct: Secondary | ICD-10-CM | POA: Diagnosis not present

## 2019-11-27 MED ORDER — GADOBUTROL 1 MMOL/ML IV SOLN
10.0000 mL | Freq: Once | INTRAVENOUS | Status: AC | PRN
  Administered 2019-11-27: 10 mL via INTRAVENOUS

## 2019-12-03 DIAGNOSIS — C641 Malignant neoplasm of right kidney, except renal pelvis: Secondary | ICD-10-CM | POA: Diagnosis not present

## 2019-12-04 ENCOUNTER — Encounter: Payer: Self-pay | Admitting: Surgery

## 2019-12-04 ENCOUNTER — Ambulatory Visit: Payer: Medicare Other | Admitting: Surgery

## 2019-12-04 ENCOUNTER — Ambulatory Visit: Payer: Self-pay

## 2019-12-04 ENCOUNTER — Other Ambulatory Visit: Payer: Self-pay

## 2019-12-04 VITALS — BP 154/88 | HR 69 | Ht 71.0 in | Wt 210.4 lb

## 2019-12-04 DIAGNOSIS — M1712 Unilateral primary osteoarthritis, left knee: Secondary | ICD-10-CM

## 2019-12-04 DIAGNOSIS — M1711 Unilateral primary osteoarthritis, right knee: Secondary | ICD-10-CM | POA: Diagnosis not present

## 2019-12-04 NOTE — Progress Notes (Signed)
74 year old black male returns for recheck of bilateral knee pain.  Patient has known history of bilateral knee DJD.  Has failed multiple modes of conservative treatment of this point.  He is ready to entertain the idea of having right total knee replacement.  States that right knee recently had a flare.  He use somebody's ointment on the right knee and states that this helped to calm it down.  I do not see where he has had conservative management with viscosupplementation.  He states that he is due to have surgery by his urologist.  States that he has been having some issues with his kidneys.  Previous history of prostate cancer.  Exam Extremely pleasant black male alert and oriented in no acute distress.  Gait is minimally antalgic.  Bile knees good range of motion.  Positive patellofemoral crepitus.  Joint lines somewhat tender.   Plan Bilateral knee DJD.  Patient understands that definitive treatment for his knees would be total knee replacements.  He is due to have urology surgery sometime in the near future so I did recommend continuing conservative management with bilateral knee viscosupplementation.  We will get preauthorization for this and have patient follow with me after to start the series of Orthovisc x3.  All questions answered.

## 2019-12-11 ENCOUNTER — Other Ambulatory Visit: Payer: Self-pay | Admitting: Urology

## 2019-12-11 DIAGNOSIS — C641 Malignant neoplasm of right kidney, except renal pelvis: Secondary | ICD-10-CM

## 2019-12-17 ENCOUNTER — Ambulatory Visit
Admission: RE | Admit: 2019-12-17 | Discharge: 2019-12-17 | Disposition: A | Discharge status: Home / Self Care | Payer: Medicare Other | Source: Ambulatory Visit | Attending: Urology | Admitting: Urology

## 2019-12-17 ENCOUNTER — Other Ambulatory Visit: Payer: Self-pay | Admitting: Interventional Radiology

## 2019-12-17 ENCOUNTER — Other Ambulatory Visit: Payer: Self-pay

## 2019-12-17 ENCOUNTER — Encounter: Payer: Self-pay | Admitting: *Deleted

## 2019-12-17 DIAGNOSIS — Z905 Acquired absence of kidney: Secondary | ICD-10-CM | POA: Diagnosis not present

## 2019-12-17 DIAGNOSIS — C641 Malignant neoplasm of right kidney, except renal pelvis: Secondary | ICD-10-CM

## 2019-12-17 DIAGNOSIS — C787 Secondary malignant neoplasm of liver and intrahepatic bile duct: Secondary | ICD-10-CM

## 2019-12-17 DIAGNOSIS — C786 Secondary malignant neoplasm of retroperitoneum and peritoneum: Secondary | ICD-10-CM | POA: Diagnosis not present

## 2019-12-17 DIAGNOSIS — Z85528 Personal history of other malignant neoplasm of kidney: Secondary | ICD-10-CM | POA: Diagnosis not present

## 2019-12-17 HISTORY — PX: IR RADIOLOGIST EVAL & MGMT: IMG5224

## 2019-12-17 NOTE — Consult Note (Addendum)
Chief Complaint: Patient was consulted remotely today (TeleHealth) for recurrent renal cell carcinoma at the request of Herrick,Benjamin W.    Referring Physician(s): Herrick,Benjamin W  History of Present Illness: Antonio Adams is a 74 y.o. male who underwent 09/08/2015 robotic-assisted laparoscopic right partial nephrectomy of upper pole right renal mass.  Pathology demonstrated papillary renal cell carcinoma, negative margins. 07/07/2016 follow-up MR abdomen demonstrates no residual/recurrent tumor 12/26/2016 CT abdomen shows stable postop changes 01/07/2018 CT abdomen shows stable postop changes 11/14/2018 CT abdomen shows some nodularity adjacent to the right hepatic lobe and right kidney 11/11/2019 CT demonstrates progressive right nodular retroperitoneal and adrenal disease 11/27/2019 follow-up MRI abdomen with contrast demonstrates 3  lesions around the nephrectomy site.  The largest 2.9 cm appears to involve adrenal and  invade hepatic segment 6.  2 smaller nodular regions posterior to the right kidney in the retroperitoneum. Past Medical History:  Diagnosis Date  . ADENOCARCINOMA, PROSTATE, GLEASON GRADE 6 12/21/2009   prostate cancer  . ALLERGIC RHINITIS 03/16/2007  . Allergy   . Anxiety   . Arthritis    back   . BACK PAIN WITH RADICULOPATHY 11/23/2008  . Cancer of kidney ([place])    partial right kidney removed  . CARPAL TUNNEL SYNDROME, BILATERAL 03/16/2007  . Chronic back pain   . Constipation    takes Senokot daily  . Depression    occasionally  . DIABETES MELLITUS, TYPE II 03/16/2007   takes Januvia and MEtformin daily  . GASTROINTESTINAL HEMORRHAGE, HX OF 03/16/2007  . GERD 03/16/2007   takes Omeprazole daily  . Glaucoma    mild - no eye drops  . Hemorrhoids   . History of colon polyps   . HYPERLIPIDEMIA 03/16/2007   takes Crestor daily  . HYPERTENSION 03/16/2007   takes Diltiazem and Lisinopril daily   . Kidney cancer, primary, with metastasis from kidney  to other site [facility])   . LEG CRAMPS 05/22/2007  . Nocturia   . Overactive bladder   . PARESTHESIA 05/21/2007  . Proctitis   . Rectal bleeding    Dr.Norins has explained its from the Radiation that he has received  . Rectal bleeding   . Sleep apnea    uses CPAP nightly  . Stomach cancer ([place])   . Urinary frequency    takes Toviaz daily    Past Surgical History:  Procedure Laterality Date  . [place], 2008   repeat surgery; ESI '08  . CARPAL TUNNEL RELEASE     bilateral  . CERVICAL FUSION    . COLONOSCOPY    . colonosocpy    . ESOPHAGOGASTRODUODENOSCOPY     ED  . FLEXIBLE SIGMOIDOSCOPY N/A 01/08/2017   Procedure: FLEXIBLE SIGMOIDOSCOPY;  Surgeon: Irene Shipper, MD;  Location[facility] ENDOSCOPY;  Service: Endoscopy;  Laterality: N/A;  . HOT HEMOSTASIS N/A 01/08/2017   Procedure: HOT HEMOSTASIS (ARGON PLASMA COAGULATION/BICAP);  Surgeon: Irene Shipper, MD;  Location: [facility] ENDOSCOPY;  Service: Endoscopy;  Laterality: N/A;  . LUMBAR LAMINECTOMY  10/30/2011   Procedure: MICRODISCECTOMY LUMBAR LAMINECTOMY;  Surgeon: Jessy Oto, MD;  Location: [place];  Service: Orthopedics;  Laterality: Right;  Right L4-5 and L5-S1 Microdiscectomy  . LUMBAR LAMINECTOMY  06/17/2012   Procedure: MICRODISCECTOMY LUMBAR LAMINECTOMY;  Surgeon: Jessy Oto, MD;  Location: [place];  Service: Orthopedics;  Laterality: N/A;  Right L5-S1 microdiscectomy  . POLYPECTOMY    . PROSTATE SURGERY  march 2012   seed implant  .  ROBOTIC ASSITED PARTIAL NEPHRECTOMY Right 09/08/2015   Procedure: XI ROBOTIC ASSITED RIGHT PARTIAL NEPHRECTOMY;  Surgeon: [name], MD;  Location: [facility];  Service: Urology;  Laterality: Right;  Clamp on: 1033 Clamp off: 1103 Total Clamp Time: 30 minutes  . SHOULDER ARTHROSCOPY WITH SUBACROMIAL DECOMPRESSION Left 01/20/2016   Procedure: LEFT SHOULDER ARTHROSCOPY WITH EXTENSIVE  DEBRIDEMENT, ACROMIOPLASTY;  Surgeon: Ninetta Lights, MD;  Location: [place];  Service:  Orthopedics;  Laterality: Left;  . SHOULDER SURGERY  LFT  . Stress Cardiolite  12/24/2003   negative for ischemia  . UPPER ESOPHAGEAL ENDOSCOPIC ULTRASOUND (EUS)  04/18/2016   [facility]  . UPPER GASTROINTESTINAL ENDOSCOPY    . 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 Authorizing Provider  aspirin EC 81 MG tablet Take 81 mg by mouth daily.    [provider]  atorvastatin (LIPITOR) 40 MG tablet TAKE 1 TABLET BY MOUTH EVERY DAY 09/22/19   Marrian Salvage, FNP  cholecalciferol (VITAMIN D) 1000 units tablet Take 1,000 Units by mouth daily.    [provider]  citalopram (CELEXA) 40 MG tablet Take 1 tablet by mouth daily. 11/16/16   [provider]  clotrimazole-betamethasone (LOTRISONE) cream APPLY TO AFFECTED AREA TWICE A DAY 10/14/19   [name], MD  Continuous Blood Gluc Receiver (FREESTYLE LIBRE 14 DAY READER) DEVI 1 Act by Does not apply route daily. 07/01/19   [name], MD  Continuous Blood Gluc Sensor (FREESTYLE LIBRE 14 DAY SENSOR) MISC USE AS DIRECTED WITH READER 07/02/19   [name], MD  diclofenac Sodium (VOLTAREN) 1 % GEL Apply 4 g topically 4 (four) times daily. 06/24/19   Jessy Oto, MD  famotidine (PEPCID) 20 MG tablet Take 1 tablet (20 mg total) by mouth 2 (two) times daily. 03/23/19   Isla Pence, MD  ferrous sulfate 325 (65 FE) MG tablet Take 1 tablet (325 mg total) by mouth 2 (two) times daily. Patient needs office visit for further refills 02/24/19   Irene Shipper, MD  fluticasone [facility]) 50 MCG/ACT nasal spray Place 2 sprays into both nostrils daily. Patient not taking: Reported on 07/14/2019 09/27/18   Marrian Salvage, FNP  gabapentin (NEURONTIN) 300 MG capsule Take 1 capsule (300 mg total) by mouth at bedtime. 08/19/18   Jessy Oto, MD  hydrOXYzine  (ATARAX/VISTARIL) 25 MG tablet  10/03/18   [provider]  Insulin Pen Needle (NOVOFINE) 32G X 6 MM MISC 1 Act by Does not apply route daily. USE TID 07/04/19   [name], MD  LANTUS SOLOSTAR 100 UNIT/ML Solostar Pen INJECT 50 UNITS INTO THE SKIN EVERY MORNING. 04/21/19   [name], MD  meclizine (ANTIVERT) 12.5 MG tablet Take 1 tablet (12.5 mg total) by mouth 3 (three) times daily as needed for dizziness. Patient not taking: Reported on 07/14/2019 09/27/18   Marrian Salvage, FNP  mirabegron ER (MYRBETRIQ) 25 MG TB24 tablet Take 1 tablet (25 mg total) by mouth daily. 04/04/18   [name], MD  omeprazole (PRILOSEC) 40 MG capsule TAKE ONE CAPSULE EVERY DAY 02/24/13   Norins, Heinz Knuckles, MD  ONE TOUCH ULTRA TEST test strip USE 2 (TWO) TIMES DAILY. AND LANCETS 2/DAY 250.03 11/05/16   [name], MD  SitaGLIPtin-MetFORMIN HCl (JANUMET XR)  50-1000 MG TB24 Take 1 tablet by mouth daily. 07/06/19   [name], MD  tamsulosin (FLOMAX) 0.4 MG CAPS capsule TAKE 1 CAPSULE (0.4 MG TOTAL) BY MOUTH DAILY. 01/20/14   [name], MD  telmisartan (MICARDIS) 40 MG tablet TAKE 1 TABLET BY MOUTH EVERY DAY 07/25/19   [name], MD  traMADol (ULTRAM) 50 MG tablet Take 1 tablet (50 mg total) by mouth every 12 (twelve) hours as needed. 12/13/18   Lanae Crumbly, PA-C     Family History  Problem Relation Age of Onset  . Cancer Sister 30       Cancer, unsure type  . Hypertension Other   . Colon cancer Neg Hx   . Esophageal cancer Neg Hx   . Stomach cancer Neg Hx   . Diabetes Neg Hx   . Colon polyps Neg Hx   . Rectal cancer Neg Hx     Social History   Socioeconomic History  . Marital status: Widowed    Spouse name: Deneise Lever  . Number of children: 3  . Years of education: 12+  . Highest education level: Not on file  Occupational History  . 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 Smoking  status: Never Smoker  . Smokeless tobacco: Never Used  Vaping Use  . Vaping Use: Never used  Substance and Sexual Activity  . Alcohol use: Not Currently    Alcohol/week: 0.0 standard drinks    Comment: occasional  . Drug use: No  . Sexual activity: Not Currently    Partners: Female  Other Topics Concern  . Not on file  Social History Narrative   Married-divorced '89, remarried '96. 2 sons, 1 daughter. 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 He wishes to be a full resuscitation candidate.       Social Determinants of Health   Financial Resource Strain:   . Difficulty of Paying Living Expenses:   Food Insecurity:   . Worried About Charity fundraiser in the Last Year:   . Arboriculturist in the Last Year:   Transportation Needs:   . Film/video editor (Medical):   Marland Kitchen Lack of Transportation (Non-Medical):   Physical Activity:   . Days of Exercise per Week:   . Minutes of Exercise per Session:   Stress:   . Feeling of Stress :   Social Connections:   . Frequency of Communication with Friends and Family:   . Frequency of Social Gatherings with Friends and Family:   . Attends Religious Services:   . Active Member of Clubs or Organizations:   . Attends Archivist Meetings:   Marland Kitchen Marital Status:     ECOG Status: 0 - Asymptomatic  Review of Systems  Review of Systems: A 12 point ROS discussed and pertinent positives are indicated in the HPI above.  All other systems are negative.  Physical Exam No direct physical exam was performed (except for noted visual exam findings with Video Visits).     Vital Signs: There were no vitals taken for this visit.  Imaging: MR ABDOMEN WWO CONTRAST  Result Date: 11/27/2019 CLINICAL DATA:  Right partial nephrectomy for renal cell carcinoma. History of prostate cancer with prior brachytherapy seed implants. EXAM: MRI ABDOMEN WITHOUT AND WITH CONTRAST TECHNIQUE:  Multiplanar multisequence MR imaging of the abdomen was performed both  before and after the administration of intravenous contrast. CONTRAST:  3mL GADAVIST GADOBUTROL 1 MMOL/ML IV SOLN COMPARISON:  Multiple exams, including 11/11/2019 CT scan and prior MRI abdomen from 07/07/2016 FINDINGS: Lower chest: Unremarkable Hepatobiliary: Tiny cyst anteriorly in segment 4 of the liver. Small gallstone dependently in the gallbladder on image 18/2. No biliary dilatation. No significant abnormal hepatic enhancement or significant focal hepatic lesion. Pancreas:  Pancreas divisum. Spleen:  Unremarkable Adrenals/Urinary Tract: Scarring associated with right kidney upper pole partial nephrectomy. There is progressive nodularity along bandlike scarring posterior to the nephrectomy site compared to 07/07/2016, for example as shown on image 14/354 this enhancing nodularity is somewhat triangular and measures 2.2 by 1.1 by 1.0 cm. In addition, and as noted on recent noncontrast CT, there is an enhancing retroperitoneal nodule on the right along the posterior renal fascia measuring 1.5 by 1.0 cm on image 31/4 which was not present on 07/07/2016 or on 11/14/2018. Both of these findings are concerning for recurrent malignancy. Instinct right adrenal mass 2.9 by 1.8 by 1.9 cm appears to be enhancing and is closely associated with the adjacent right hepatic lobe margin, almost appearing to invade the right hepatic lobe margin on image 14/2., new from 11/14/2018, metastatic lesion is a distinct possibility. Additional benign bilateral renal cysts are present. Left adrenal gland unremarkable. Stomach/Bowel: Unremarkable Vascular/Lymphatic:  Aortoiliac atherosclerotic vascular disease. Other:  No supplemental non-categorized findings. Musculoskeletal: Unremarkable IMPRESSION: 1. Three new lesions on the right are concerning for recurrent malignancy from the prior renal cell carcinoma. First of all, there is increasing nodularity along  bandlike scarring along the posterior periphery of the nephrectomy site. Secondly, there is a new (from last year) enhancing nodule along the posterior renal fascia below the level of the kidney. Finally, there is a new right adrenal nodule with nonspecific imaging characteristics very closely associated with the adjacent right hepatic lobe capsule. 2. Pancreas divisum. 3. Cholelithiasis. Electronically Signed   By: Van Clines M.D.   On: 11/27/2019 13:22    Labs:  CBC: Recent Labs    03/23/19 1906 04/02/19 1406 11/21/19 1849  WBC 3.8* 3.8* 3.0*  HGB 11.2* 10.8* 9.9*  HCT 34.7* 31.1* 29.5*  PLT 203 205.0 209    COAGS: No results for input(s): INR, APTT in the last 8760 hours.  BMP: Recent Labs    03/23/19 1906 04/02/19 1406 11/21/19 1849  NA 136 140 134*  K 4.8 4.5 4.7  CL 107 106 105  CO2 22 24 21*  GLUCOSE 80 176* 107*  BUN 32* 29* 31*  CALCIUM 10.2 9.8 9.6  CREATININE 2.10* 2.23* 2.17*  GFRNONAA 31*  --  29*  GFRAA 35*  --  34*    LIVER FUNCTION TESTS: No results for input(s): BILITOT, AST, ALT, ALKPHOS, PROT, ALBUMIN in the last 8760 hours.  TUMOR MARKERS: No results for input(s): AFPTM, CEA, CA199, CHROMGRNA in the last 8760 hours.  Assessment and Plan:  My impression is that this patient has multifocal locally recurrent renal cell carcinoma in the right retroperitoneum invading the liver and  involving right adrenal gland.  No evidence of distal metastatic disease. Core biopsy would be useful to confirm diagnosis. Reviewed the findings with the patient.  I reviewed the treatment options including watchful waiting, open surgery, and percutaneous ablation.  We discussed in detail the CT-guided percutaneous ablation techniques under anesthesia, anticipated benefits, possible risks and complications.  He seemed to understand and asked appropriate questions which were answered.  He is motivated to  proceed with treatment of these recurrent lesions.  We will  start with Percutaneous core biopsy samples to confirm recurrence. Treatment if indicated may require a combination of surgical (adrenalectomy) and percutaneous ablation.  Thank you for this interesting consult.  I greatly enjoyed meeting JOANTHAN HLAVACEK and look forward to participating in their care.  A copy of this report was sent to the requesting provider on this date.  Electronically Signed: Rickard Rhymes 12/17/2019, 2:54 PM   I spent a total of  30 Minutes   in remote  clinical consultation, greater than 50% of which was counseling/coordinating care for locally recurrent renal cell carcinoma.  Visit type: Audio only (telephone). Audio (no video) only due to patient's lack of internet/smartphone capability. Alternative for in-person consultation at [facility], [address] [place], [place], [place]. This visit type was conducted due to national recommendations for restrictions regarding the COVID-19 Pandemic (e.g. social distancing).  This format is felt to be most appropriate for this patient at this time.  All issues noted in this document were discussed and addressed.

## 2019-12-18 ENCOUNTER — Ambulatory Visit (INDEPENDENT_AMBULATORY_CARE_PROVIDER_SITE_OTHER): Payer: Medicare Other | Admitting: Internal Medicine

## 2019-12-18 ENCOUNTER — Encounter: Payer: Self-pay | Admitting: Internal Medicine

## 2019-12-18 ENCOUNTER — Other Ambulatory Visit: Payer: Self-pay

## 2019-12-18 VITALS — BP 176/94 | HR 62 | Temp 98.2°F | Resp 16 | Ht 71.0 in | Wt 214.0 lb

## 2019-12-18 DIAGNOSIS — I10 Essential (primary) hypertension: Secondary | ICD-10-CM

## 2019-12-18 DIAGNOSIS — E1142 Type 2 diabetes mellitus with diabetic polyneuropathy: Secondary | ICD-10-CM | POA: Insufficient documentation

## 2019-12-18 DIAGNOSIS — N1832 Chronic kidney disease, stage 3b: Secondary | ICD-10-CM

## 2019-12-18 DIAGNOSIS — R002 Palpitations: Secondary | ICD-10-CM

## 2019-12-18 DIAGNOSIS — Z794 Long term (current) use of insulin: Secondary | ICD-10-CM

## 2019-12-18 DIAGNOSIS — R809 Proteinuria, unspecified: Secondary | ICD-10-CM | POA: Diagnosis not present

## 2019-12-18 DIAGNOSIS — E1129 Type 2 diabetes mellitus with other diabetic kidney complication: Secondary | ICD-10-CM

## 2019-12-18 DIAGNOSIS — D539 Nutritional anemia, unspecified: Secondary | ICD-10-CM

## 2019-12-18 DIAGNOSIS — E118 Type 2 diabetes mellitus with unspecified complications: Secondary | ICD-10-CM | POA: Diagnosis not present

## 2019-12-18 DIAGNOSIS — E785 Hyperlipidemia, unspecified: Secondary | ICD-10-CM

## 2019-12-18 MED ORDER — INDAPAMIDE 1.25 MG PO TABS: 1.2500 mg | ORAL_TABLET | Freq: Every day | ORAL | 0 refills | Status: DC

## 2019-12-18 MED ORDER — L-METHYLFOLATE-B6-B12 3-35-2 MG PO TABS: 1.0000 | ORAL_TABLET | Freq: Every day | ORAL | 1 refills | Status: DC

## 2019-12-18 NOTE — Progress Notes (Addendum)
Subjective:  Patient ID: Antonio Adams, male    DOB: 1946-02-12  Age: 74 y.o. MRN: [id number]  CC: Anemia, Hypertension, and Diabetes  This visit occurred during the SARS-CoV-2 public health emergency.  Safety protocols were in place, including screening questions prior to the visit, additional usage of staff PPE, and extensive cleaning of exam room while observing appropriate contact time as indicated for disinfecting solutions.    HPI INMAN FETTIG presents for f/up - He is concerned his blood pressure is not adequately well controlled.  He denies any recent episodes of headache, blurred vision, chest pain, shortness of breath.  He has had palpitations for several weeks.  He describes a sensation of extra beats while at rest.  He denies dizziness, lightheadedness, near syncope, or edema.  He continues to complain of tinnitus, worsening numbness in his feet, bright red blood per rectum, and weight gain.  He denies polys.  Outpatient Medications Prior to Visit  Medication Sig Dispense Refill  . cholecalciferol (VITAMIN D) 1000 units tablet Take 1,000 Units by mouth daily.    . citalopram (CELEXA) 40 MG tablet Take 40 mg by mouth daily.     . clotrimazole-betamethasone (LOTRISONE) cream APPLY TO AFFECTED AREA TWICE A DAY (Patient not taking: Reported on 12/31/2019) 30 g 0  . Continuous Blood Gluc Receiver (FREESTYLE LIBRE 14 DAY READER) DEVI 1 Act by Does not apply route daily. 1 each 9  . Continuous Blood Gluc Sensor (FREESTYLE LIBRE 14 DAY SENSOR) MISC USE AS DIRECTED WITH READER 2 each 9  . diclofenac Sodium (VOLTAREN) 1 % GEL Apply 4 g topically 4 (four) times daily. (Patient not taking: Reported on 12/31/2019) 500 g 2  . ferrous sulfate 325 (65 FE) MG tablet Take 1 tablet (325 mg total) by mouth 2 (two) times daily. Patient needs office visit for further refills (Patient taking differently: Take 325 mg by mouth daily with breakfast. ) 60 tablet 1  . fluticasone (FLONASE) 50  MCG/ACT nasal spray Place 2 sprays into both nostrils daily. (Patient not taking: Reported on 12/31/2019) 16 g 6  . gabapentin (NEURONTIN) 300 MG capsule Take 1 capsule (300 mg total) by mouth at bedtime. (Patient not taking: Reported on 12/31/2019) 90 capsule 3  . Insulin Pen Needle (NOVOFINE) 32G X 6 MM MISC 1 Act by Does not apply route daily. USE TID 100 each 11  . LANTUS SOLOSTAR 100 UNIT/ML Solostar Pen INJECT 50 UNITS INTO THE SKIN EVERY MORNING. (Patient taking differently: Inject 30 Units into the skin daily as needed (blood sugar over 200). ) 15 mL 1  . omeprazole (PRILOSEC) 40 MG capsule TAKE ONE CAPSULE EVERY DAY (Patient not taking: Reported on 12/31/2019) 30 capsule 10  . ONE TOUCH ULTRA TEST test strip USE 2 (TWO) TIMES DAILY. AND LANCETS 2/DAY 250.03 100 each 11  . tamsulosin (FLOMAX) 0.4 MG CAPS capsule TAKE 1 CAPSULE (0.4 MG TOTAL) BY MOUTH DAILY. (Patient taking differently: Take 0.4 mg by mouth daily. ) 30 capsule 1  . aspirin EC 81 MG tablet Take 81 mg by mouth daily.    Marland Kitchen atorvastatin (LIPITOR) 40 MG tablet TAKE 1 TABLET BY MOUTH EVERY DAY 90 tablet 0  . famotidine (PEPCID) 20 MG tablet Take 1 tablet (20 mg total) by mouth 2 (two) times daily. 30 tablet 0  . hydrOXYzine (ATARAX/VISTARIL) 25 MG tablet     . meclizine (ANTIVERT) 12.5 MG tablet Take 1 tablet (12.5 mg total) by mouth 3 (three) times daily as  needed for dizziness. 30 tablet 0  . mirabegron ER (MYRBETRIQ) 25 MG TB24 tablet Take 1 tablet (25 mg total) by mouth daily. 90 tablet 1  . SitaGLIPtin-MetFORMIN HCl (JANUMET XR) 50-1000 MG TB24 Take 1 tablet by mouth daily. 90 tablet 0  . telmisartan (MICARDIS) 40 MG tablet TAKE 1 TABLET BY MOUTH EVERY DAY 90 tablet 0  . traMADol (ULTRAM) 50 MG tablet Take 1 tablet (50 mg total) by mouth every 12 (twelve) hours as needed. 30 tablet 0   No facility-administered medications prior to visit.    ROS Review of Systems  Constitutional: Positive for unexpected weight change.  Negative for appetite change, chills, diaphoresis and fatigue.  HENT: Positive for tinnitus. Negative for ear pain, hearing loss and trouble swallowing.   Eyes: Negative.   Respiratory: Negative.  Negative for cough, chest tightness, shortness of breath and wheezing.   Cardiovascular: Positive for palpitations. Negative for chest pain and leg swelling.  Gastrointestinal: Negative for abdominal pain, constipation, diarrhea, nausea and vomiting.  Endocrine: Negative.  Negative for polydipsia, polyphagia and polyuria.  Genitourinary: Negative.  Negative for difficulty urinating, dysuria, hematuria and urgency.  Musculoskeletal: Negative for arthralgias, joint swelling and myalgias.  Skin: Negative.  Negative for color change, pallor and rash.  Neurological: Positive for numbness. Negative for dizziness, weakness and light-headedness.  Hematological: Negative for adenopathy. Does not bruise/bleed easily.  Psychiatric/Behavioral: Negative.     Objective:  BP (!) 176/94 (BP Location: Left Arm, Patient Position: Sitting, Cuff Size: Large)   Pulse 62   Temp 98.2 F (36.8 C) (Oral)   Resp 16   Ht 5\' 11"  (1.803 m)   Wt 214 lb (97.1 kg)   SpO2 98%   BMI 29.85 kg/m   BP Readings from Last 3 Encounters:  12/18/19 (!) 176/94  12/04/19 (!) 154/88  11/21/19 (!) 164/89    Wt Readings from Last 3 Encounters:  12/18/19 214 lb (97.1 kg)  12/04/19 210 lb 6.4 oz (95.4 kg)  11/21/19 210 lb 6.4 oz (95.4 kg)    Physical Exam Vitals reviewed.  Constitutional:      Appearance: Normal appearance.  HENT:     Nose: Nose normal.     Mouth/Throat:     Mouth: Mucous membranes are moist.  Eyes:     General: No scleral icterus.    Conjunctiva/sclera: Conjunctivae normal.  Cardiovascular:     Rate and Rhythm: Normal rate and regular rhythm. Occasional extrasystoles are present.    Pulses: Normal pulses.     Heart sounds: No murmur heard.      Comments: EKG - Sinus rhythm with 1st degree AV  block Minimal voltage for LVH Q waves in aVR and aVL are not new No change from the prior EKG Pulmonary:     Effort: Pulmonary effort is normal.     Breath sounds: No stridor. No wheezing, rhonchi or rales.  Abdominal:     General: Abdomen is flat. Bowel sounds are normal. There is no distension.     Palpations: Abdomen is soft. There is no hepatomegaly, splenomegaly or mass.     Tenderness: There is no abdominal tenderness.  Musculoskeletal:     Cervical back: Neck supple.     Right lower leg: No edema.     Left lower leg: No edema.  Skin:    General: Skin is warm and dry.     Coloration: Skin is not pale.  Neurological:     General: No focal deficit present.  Mental Status: He is alert.     Cranial Nerves: Cranial nerves are intact.     Sensory: Sensation is intact.     Motor: No weakness or tremor.     Coordination: Coordination is intact.     Gait: Gait is intact.     Deep Tendon Reflexes: Reflexes normal.     Reflex Scores:      Tricep reflexes are 0 on the right side and 0 on the left side.      Bicep reflexes are 0 on the right side and 0 on the left side.      Brachioradialis reflexes are 0 on the right side and 0 on the left side.      Patellar reflexes are 0 on the right side and 0 on the left side.      Achilles reflexes are 0 on the right side and 0 on the left side. Psychiatric:        Mood and Affect: Mood normal.        Behavior: Behavior normal.     Lab Results  Component Value Date   WBC 3.3 (L) 12/18/2019   HGB 10.4 (L) 12/18/2019   HCT 30.8 (L) 12/18/2019   PLT 197 12/18/2019   GLUCOSE 93 12/18/2019   CHOL 150 04/02/2019   TRIG 78.0 04/02/2019   HDL 35.70 (L) 04/02/2019   LDLCALC 99 04/02/2019   ALT 16 09/27/2018   AST 16 09/27/2018   NA 139 12/18/2019   K 5.1 12/18/2019   CL 107 12/18/2019   CREATININE 2.11 (H) 12/18/2019   BUN 24 12/18/2019   CO2 27 12/18/2019   TSH 1.82 04/02/2019   PSA 0.02 (L) 04/02/2019   INR 1.02 07/14/2010    HGBA1C 6.4 (H) 12/18/2019   MICROALBUR 97.0 12/18/2019    IR Radiologist Eval & Mgmt  Result Date: 12/17/2019 Please refer to notes tab for details about interventional procedure. (Op Note)   Assessment & Plan:   Witt was seen today for anemia, hypertension and diabetes.  Diagnoses and all orders for this visit:  Essential hypertension- His blood pressure is not adequately well controlled. I recommend that he be compliant with the ARB and I will add a low-dose of a thiazide diuretic. -     Urinalysis, Routine w reflex microscopic; Future -     BASIC METABOLIC PANEL WITH GFR; Future -     EKG 12-Lead -     indapamide (LOZOL) 1.25 MG tablet; Take 1 tablet (1.25 mg total) by mouth daily. -     BASIC METABOLIC PANEL WITH GFR -     Urinalysis, Routine w reflex microscopic -     telmisartan (MICARDIS) 40 MG tablet; Take 1 tablet (40 mg total) by mouth daily.  Microalbuminuria due to type 2 diabetes mellitus ([place])- The proteinuria has worsened.  I recommended that he be compliant with the ARB and to see nephrology. -     Microalbumin / creatinine urine ratio; Future -     Microalbumin / creatinine urine ratio -     telmisartan (MICARDIS) 40 MG tablet; Take 1 tablet (40 mg total) by mouth daily. -     Ambulatory referral to Nephrology  Type II diabetes mellitus with manifestations ( [place])- His blood sugars are adequately well controlled. -     Hemoglobin A1c; Future -     BASIC METABOLIC PANEL WITH GFR; Future -     HM Diabetes Foot Exam -     BASIC METABOLIC  PANEL WITH GFR -     Hemoglobin A1c  Stage 3b chronic kidney disease-his renal function is stable. -     Urinalysis, Routine w reflex microscopic; Future -     Urinalysis, Routine w reflex microscopic -     Ambulatory referral to Nephrology  Deficiency anemia- He remains mildly anemic.  I will screen for vitamin deficiencies. -     CBC with Differential/Platelet; Future -     Reticulocytes; Future -     Iron; Future -      Ferritin; Future -     Ferritin -     Iron -     Reticulocytes -     CBC with Differential/Platelet  Diabetic polyneuropathy associated with type 2 diabetes mellitus ([place])- I have asked him to take metanx as directed. -     l-methylfolate-B6-B12 (METANX) 3-35-2 MG TABS tablet; Take 1 tablet by mouth daily.  Palpitations- It sounds like he has a benign dysrhythmia such as PVCs or PACs.  I recommended that he undergo 48-hour Holter monitor to see if he has anything more serious. -     EKG 12-Lead -     Holter monitor - 48 hour; Future  Type 2 diabetes mellitus with complication, with long-term current use of insulin (HCC) -     telmisartan (MICARDIS) 40 MG tablet; Take 1 tablet (40 mg total) by mouth daily.  Hyperlipidemia with target LDL less than 100- He has achieved his LDL goal is doing well on the statin. -     atorvastatin (LIPITOR) 40 MG tablet; Take 1 tablet (40 mg total) by mouth daily.  Other orders -     MICROSCOPIC MESSAGE   I have discontinued Otila Back. Mukai's aspirin EC, mirabegron ER, meclizine, hydrOXYzine, traMADol, famotidine, and Janumet XR. I have also changed his telmisartan and atorvastatin. Additionally, I am having him start on l-methylfolate-B6-B12 and indapamide. Lastly, I am having him maintain his omeprazole, tamsulosin, cholecalciferol, ONE TOUCH ULTRA TEST, citalopram, gabapentin, fluticasone, ferrous sulfate, Lantus SoloStar, diclofenac Sodium, FreeStyle Libre 14 Day Reader, [employer] 14 Day Sensor, NovoFine, and clotrimazole-betamethasone.  Meds ordered this encounter  Medications  . l-methylfolate-B6-B12 (METANX) 3-35-2 MG TABS tablet    Sig: Take 1 tablet by mouth daily.    Dispense:  90 tablet    Refill:  1  . indapamide (LOZOL) 1.25 MG tablet    Sig: Take 1 tablet (1.25 mg total) by mouth daily.    Dispense:  90 tablet    Refill:  0  . telmisartan (MICARDIS) 40 MG tablet    Sig: Take 1 tablet (40 mg total) by mouth daily.    Dispense:   90 tablet    Refill:  1  . atorvastatin (LIPITOR) 40 MG tablet    Sig: Take 1 tablet (40 mg total) by mouth daily.    Dispense:  90 tablet    Refill:  1   I spent 50 minutes in preparing to see the patient by review of recent labs, imaging and procedures, obtaining and reviewing separately obtained history, communicating with the patient and family or caregiver, ordering medications, tests or procedures, and documenting clinical information in the EHR including the differential Dx, treatment, and any further evaluation and other management of 1. Essential hypertension 2. Microalbuminuria due to type 2 diabetes mellitus ([place]) 3. Type II diabetes mellitus with manifestations (HCC) 4. Stage 3b chronic kidney disease 5. Deficiency anemia 6. Diabetic polyneuropathy associated with type 2 diabetes mellitus (HCC) 7. Palpitations  8. Type 2 diabetes mellitus with complication, with long-term current use of insulin ([place]) 9. Hyperlipidemia with target LDL less than 100    Follow-up: Return in about 6 weeks (around 01/29/2020).  Scarlette Calico, MD

## 2019-12-18 NOTE — Patient Instructions (Signed)

## 2019-12-19 LAB — MICROALBUMIN / CREATININE URINE RATIO
Creatinine, Urine: 61 mg/dL (ref 20–320)
Microalb Creat Ratio: 1590 mcg/mg creat — ABNORMAL HIGH (ref <30)
Microalb, Ur: 97 mg/dL

## 2019-12-19 LAB — URINALYSIS, ROUTINE W REFLEX MICROSCOPIC
Bacteria, UA: NONE SEEN /HPF
Bilirubin Urine: NEGATIVE
Glucose, UA: NEGATIVE
Hgb urine dipstick: NEGATIVE
Hyaline Cast: NONE SEEN /LPF
Ketones, ur: NEGATIVE
Leukocytes,Ua: NEGATIVE
Nitrite: NEGATIVE
Specific Gravity, Urine: 1.014 (ref 1.001–1.03)
Squamous Epithelial / HPF: NONE SEEN /HPF (ref <=5)
pH: 6.5 (ref 5.0–8.0)

## 2019-12-19 LAB — FERRITIN: Ferritin: 47 ng/mL (ref 24–380)

## 2019-12-19 LAB — HEMOGLOBIN A1C
Hgb A1c MFr Bld: 6.4 % of total Hgb — ABNORMAL HIGH (ref <5.7)
Mean Plasma Glucose: 137 (calc)
eAG (mmol/L): 7.6 (calc)

## 2019-12-19 LAB — IRON: Iron: 74 ug/dL (ref 50–180)

## 2019-12-19 LAB — BASIC METABOLIC PANEL WITH GFR
BUN/Creatinine Ratio: 11 (calc) (ref 6–22)
BUN: 24 mg/dL (ref 7–25)
CO2: 27 mmol/L (ref 20–32)
Calcium: 10 mg/dL (ref 8.6–10.3)
Chloride: 107 mmol/L (ref 98–110)
Creat: 2.11 mg/dL — ABNORMAL HIGH (ref 0.70–1.18)
GFR, Est African American: 35 mL/min/{1.73_m2} — ABNORMAL LOW (ref >=60)
GFR, Est Non African American: 30 mL/min/{1.73_m2} — ABNORMAL LOW (ref >=60)
Glucose, Bld: 93 mg/dL (ref 65–99)
Potassium: 5.1 mmol/L (ref 3.5–5.3)
Sodium: 139 mmol/L (ref 135–146)

## 2019-12-19 LAB — CBC WITH DIFFERENTIAL/PLATELET
Absolute Monocytes: 564 cells/uL (ref 200–950)
Basophils Absolute: 40 cells/uL (ref 0–200)
Basophils Relative: 1.2 %
Eosinophils Absolute: 122 cells/uL (ref 15–500)
Eosinophils Relative: 3.7 %
HCT: 30.8 % — ABNORMAL LOW (ref 38.5–50.0)
Hemoglobin: 10.4 g/dL — ABNORMAL LOW (ref 13.2–17.1)
Lymphs Abs: 904 cells/uL (ref 850–3900)
MCH: 29.5 pg (ref 27.0–33.0)
MCHC: 33.8 g/dL (ref 32.0–36.0)
MCV: 87.5 fL (ref 80.0–100.0)
MPV: 9.9 fL (ref 7.5–12.5)
Monocytes Relative: 17.1 %
Neutro Abs: 1670 cells/uL (ref 1500–7800)
Neutrophils Relative %: 50.6 %
Platelets: 197 10*3/uL (ref 140–400)
RBC: 3.52 10*6/uL — ABNORMAL LOW (ref 4.20–5.80)
RDW: 13.8 % (ref 11.0–15.0)
Total Lymphocyte: 27.4 %
WBC: 3.3 10*3/uL — ABNORMAL LOW (ref 3.8–10.8)

## 2019-12-19 LAB — RETICULOCYTES
ABS Retic: 52800 cells/uL (ref 25000–9000)
Retic Ct Pct: 1.5 %

## 2019-12-19 MED ORDER — TELMISARTAN 40 MG PO TABS: 40.0000 mg | ORAL_TABLET | Freq: Every day | ORAL | 1 refills | Status: DC

## 2019-12-20 MED ORDER — ATORVASTATIN CALCIUM 40 MG PO TABS: 40.0000 mg | ORAL_TABLET | Freq: Every day | ORAL | 1 refills | Status: DC

## 2019-12-22 ENCOUNTER — Other Ambulatory Visit: Payer: Self-pay | Admitting: Internal Medicine

## 2019-12-22 DIAGNOSIS — R809 Proteinuria, unspecified: Secondary | ICD-10-CM

## 2019-12-22 MED ORDER — KERENDIA 10 MG PO TABS: 1.0000 | ORAL_TABLET | Freq: Every day | ORAL | 0 refills | Status: DC

## 2019-12-30 ENCOUNTER — Telehealth: Payer: Self-pay | Admitting: Internal Medicine

## 2019-12-30 ENCOUNTER — Telehealth: Payer: Self-pay

## 2019-12-30 NOTE — Telephone Encounter (Signed)
New message:   Suszanne Conners is calling from [employer] in regards to the PA for the medication Finerenone (KERENDIA) 10 MG TABS for the pt. She states she was calling to see if we needed any additional assistance completing this for the pt. She states that she is available M-F from [facility] standard time. Please advise.

## 2019-12-30 NOTE — Telephone Encounter (Signed)
Key: [id number]

## 2019-12-30 NOTE — Telephone Encounter (Signed)
I am doing the prior auth and I am not familiar with [facility]. Are there any alternatives to this medication?

## 2020-01-02 ENCOUNTER — Other Ambulatory Visit: Payer: Self-pay | Admitting: Radiology

## 2020-01-04 ENCOUNTER — Other Ambulatory Visit: Payer: Self-pay | Admitting: Internal Medicine

## 2020-01-04 DIAGNOSIS — Z794 Long term (current) use of insulin: Secondary | ICD-10-CM

## 2020-01-04 DIAGNOSIS — E118 Type 2 diabetes mellitus with unspecified complications: Secondary | ICD-10-CM

## 2020-01-05 ENCOUNTER — Ambulatory Visit (HOSPITAL_COMMUNITY)
Admission: RE | Admit: 2020-01-05 | Discharge: 2020-01-05 | Disposition: A | Discharge status: Home / Self Care | Payer: Medicare Other | Source: Ambulatory Visit | Attending: Interventional Radiology | Admitting: Interventional Radiology

## 2020-01-05 ENCOUNTER — Other Ambulatory Visit: Payer: Self-pay

## 2020-01-05 DIAGNOSIS — C787 Secondary malignant neoplasm of liver and intrahepatic bile duct: Secondary | ICD-10-CM

## 2020-01-05 DIAGNOSIS — E279 Disorder of adrenal gland, unspecified: Secondary | ICD-10-CM | POA: Insufficient documentation

## 2020-01-05 DIAGNOSIS — Z85528 Personal history of other malignant neoplasm of kidney: Secondary | ICD-10-CM | POA: Diagnosis not present

## 2020-01-05 DIAGNOSIS — Z905 Acquired absence of kidney: Secondary | ICD-10-CM | POA: Insufficient documentation

## 2020-01-05 DIAGNOSIS — C641 Malignant neoplasm of right kidney, except renal pelvis: Secondary | ICD-10-CM | POA: Diagnosis not present

## 2020-01-05 DIAGNOSIS — E278 Other specified disorders of adrenal gland: Secondary | ICD-10-CM | POA: Diagnosis not present

## 2020-01-05 DIAGNOSIS — K7689 Other specified diseases of liver: Secondary | ICD-10-CM | POA: Diagnosis not present

## 2020-01-05 LAB — GLUCOSE, CAPILLARY: Glucose-Capillary: 84 mg/dL (ref 70–99)

## 2020-01-05 LAB — CBC
HCT: 32.3 % — ABNORMAL LOW (ref 39.0–52.0)
Hemoglobin: 10.7 g/dL — ABNORMAL LOW (ref 13.0–17.0)
MCH: 29.6 pg (ref 26.0–34.0)
MCHC: 33.1 g/dL (ref 30.0–36.0)
MCV: 89.5 fL (ref 80.0–100.0)
Platelets: 230 10*3/uL (ref 150–400)
RBC: 3.61 MIL/uL — ABNORMAL LOW (ref 4.22–5.81)
RDW: 14.3 % (ref 11.5–15.5)
WBC: 3.8 10*3/uL — ABNORMAL LOW (ref 4.0–10.5)
nRBC: 0 % (ref 0.0–0.2)

## 2020-01-05 LAB — PROTIME-INR
INR: 1 (ref 0.8–1.2)
Prothrombin Time: 12.7 seconds (ref 11.4–15.2)

## 2020-01-05 MED ORDER — HYDROCODONE-ACETAMINOPHEN 5-325 MG PO TABS: 1.0000 | ORAL_TABLET | ORAL | Status: DC | PRN

## 2020-01-05 MED ORDER — LIDOCAINE HCL 1 % IJ SOLN
INTRAMUSCULAR | Status: AC
  Filled 2020-01-05: qty 20

## 2020-01-05 MED ORDER — FENTANYL CITRATE (PF) 100 MCG/2ML IJ SOLN
INTRAMUSCULAR | Status: AC
  Filled 2020-01-05: qty 2

## 2020-01-05 MED ORDER — FENTANYL CITRATE (PF) 100 MCG/2ML IJ SOLN
INTRAMUSCULAR | Status: AC | PRN
  Administered 2020-01-05: 50 ug via INTRAVENOUS

## 2020-01-05 MED ORDER — MIDAZOLAM HCL 2 MG/2ML IJ SOLN
INTRAMUSCULAR | Status: AC | PRN
  Administered 2020-01-05: 1 mg via INTRAVENOUS

## 2020-01-05 MED ORDER — SODIUM CHLORIDE 0.9 % IV SOLN: INTRAVENOUS | Status: DC

## 2020-01-05 MED ORDER — MIDAZOLAM HCL 2 MG/2ML IJ SOLN
INTRAMUSCULAR | Status: AC
  Filled 2020-01-05: qty 2

## 2020-01-05 NOTE — Discharge Instructions (Addendum)
Liver Biopsy, Care After These instructions give you information on caring for yourself after your procedure. Your doctor may also give you more specific instructions. Call your doctor if you have any problems or questions after your procedure. What can I expect after the procedure? After the procedure, it is common to have:  Pain and soreness where the biopsy was done.  Bruising around the area where the biopsy was done.  Sleepiness and be tired for a few days. Follow these instructions at home: Medicines  Take over-the-counter and prescription medicines only as told by your doctor.  If you were prescribed an antibiotic medicine, take it as told by your doctor. Do not stop taking the antibiotic even if you start to feel better.  Do not take medicines such as aspirin and ibuprofen. These medicines can thin your blood. Do not take these medicines unless your doctor tells you to take them.  If you are taking prescription pain medicine, take actions to prevent or treat constipation. Your doctor may recommend that you: ? Drink enough fluid to keep your pee (urine) clear or pale yellow. ? Take over-the-counter or prescription medicines. ? Eat foods that are high in fiber, such as fresh fruits and vegetables, whole grains, and beans. ? Limit foods that are high in fat and processed sugars, such as fried and sweet foods. Caring for your cut  Follow instructions from your doctor about how to take care of your cuts from surgery (incisions). Make sure you: ? Wash your hands with soap and water before you change your bandage (dressing). If you cannot use soap and water, use hand sanitizer. ? Change your bandage as told by your doctor. ? Leave stitches (sutures), skin glue, or skin tape (adhesive) strips in place. They may need to stay in place for 2 weeks or longer. If tape strips get loose and curl up, you may trim the loose edges. Do not remove tape strips completely unless your doctor says it is  okay.  Check your cuts every day for signs of infection. Check for: ? Redness, swelling, or more pain. ? Fluid or blood. ? Pus or a bad smell. ? Warmth.  Do not take baths, swim, or use a hot tub until your doctor says it is okay to do so. Activity   Rest at home for 1-2 days or as told by your doctor. ? Avoid sitting for a long time without moving. Get up to take short walks every 1-2 hours.  Return to your normal activities as told by your doctor. Ask what activities are safe for you.  Do not do these things in the first 24 hours: ? Drive. ? Use machinery. ? Take a bath or shower.  Do not lift more than 10 pounds (4.5 kg) or play contact sports for the first 2 weeks. General instructions   Do not drink alcohol in the first week after the procedure.  Have someone stay with you for at least 24 hours after the procedure.  Get your test results. Ask your doctor or the department that is doing the test: ? When will my results be ready? ? How will I get my results? ? What are my treatment options? ? What other tests do I need? ? What are my next steps?  Keep all follow-up visits as told by your doctor. This is important. Contact a doctor if:  A cut bleeds and leaves more than just a small spot of blood.  A cut is red, puffs up (  swells), or hurts more than before.  Fluid or something else comes from a cut.  A cut smells bad.  You have a fever or chills. Get help right away if:  You have swelling, bloating, or pain in your belly (abdomen).  You get dizzy or faint.  You have a rash.  You feel sick to your stomach (nauseous) or throw up (vomit).  You have trouble breathing, feel short of breath, or feel faint.  Your chest hurts.  You have problems talking or seeing.  You have trouble with your balance or moving your arms or legs. Summary  After the procedure, it is common to have pain, soreness, bruising, and tiredness.  Your doctor will tell you how to  take care of yourself at home. Change your bandage, take your medicines, and limit your activities as told by your doctor.  Call your doctor if you have symptoms of infection. Get help right away if your belly swells, your cut bleeds a lot, or you have trouble talking or breathing. This information is not intended to replace advice given to you by your health care provider. Make sure you discuss any questions you have with your health care provider. Document Revised: 05/11/2017 Document Reviewed: 05/11/2017 Elsevier Patient Education  2020 Elsevier Inc. Moderate Conscious Sedation, Adult Sedation is the use of medicines to promote relaxation and relieve discomfort and anxiety. Moderate conscious sedation is a type of sedation. Under moderate conscious sedation, you are less alert than normal, but you are still able to respond to instructions, touch, or both. Moderate conscious sedation is used during short medical and dental procedures. It is milder than deep sedation, which is a type of sedation under which you cannot be easily woken up. It is also milder than general anesthesia, which is the use of medicines to make you unconscious. Moderate conscious sedation allows you to return to your regular activities sooner. Tell a health care provider about:  Any allergies you have.  All medicines you are taking, including vitamins, herbs, eye drops, creams, and over-the-counter medicines.  Use of steroids (by mouth or creams).  Any problems you or family members have had with sedatives and anesthetic medicines.  Any blood disorders you have.  Any surgeries you have had.  Any medical conditions you have, such as sleep apnea.  Whether you are pregnant or may be pregnant.  Any use of cigarettes, alcohol, marijuana, or street drugs. What are the risks? Generally, this is a safe procedure. However, problems may occur, including:  Getting too much medicine (oversedation).  Nausea.  Allergic  reaction to medicines.  Trouble breathing. If this happens, a breathing tube may be used to help with breathing. It will be removed when you are awake and breathing on your own.  Heart trouble.  Lung trouble. What happens before the procedure? Staying hydrated Follow instructions from your health care provider about hydration, which may include:  Up to 2 hours before the procedure - you may continue to drink clear liquids, such as water, clear fruit juice, black coffee, and plain tea. Eating and drinking restrictions Follow instructions from your health care provider about eating and drinking, which may include:  8 hours before the procedure - stop eating heavy meals or foods such as meat, fried foods, or fatty foods.  6 hours before the procedure - stop eating light meals or foods, such as toast or cereal.  6 hours before the procedure - stop drinking milk or drinks that contain milk.  2   hours before the procedure - stop drinking clear liquids. Medicine Ask your health care provider about:  Changing or stopping your regular medicines. This is especially important if you are taking diabetes medicines or blood thinners.  Taking medicines such as aspirin and ibuprofen. These medicines can thin your blood. Do not take these medicines before your procedure if your health care provider instructs you not to.  Tests and exams  You will have a physical exam.  You may have blood tests done to show: ? How well your kidneys and liver are working. ? How well your blood can clot. General instructions  Plan to have someone take you home from the hospital or clinic.  If you will be going home right after the procedure, plan to have someone with you for 24 hours. What happens during the procedure?  An IV tube will be inserted into one of your veins.  Medicine to help you relax (sedative) will be given through the IV tube.  The medical or dental procedure will be performed. What  happens after the procedure?  Your blood pressure, heart rate, breathing rate, and blood oxygen level will be monitored often until the medicines you were given have worn off.  Do not drive for 24 hours. This information is not intended to replace advice given to you by your health care provider. Make sure you discuss any questions you have with your health care provider. Document Revised: 04/13/2017 Document Reviewed: 08/21/2015 Elsevier Patient Education  2020 Elsevier Inc.  

## 2020-01-05 NOTE — Procedures (Signed)
  Procedure: CT guided core biopsy R adrenal mass   EBL:   minimal Complications:  none immediate  See full dictation in [facility].  Dillard Cannon MD Main # [phone] Pager  [phone]

## 2020-01-05 NOTE — Addendum Note (Signed)
Addended by: [name] on: 01/05/2020 07:28 AM   Modules accepted: Orders

## 2020-01-05 NOTE — Progress Notes (Signed)
Chief Complaint: Patient was seen in consultation today for liver lesion biopsy at the request of Dr Jearl Klinefelter   Supervising Physician: Arne Cleveland  Patient Status: [facility] - Out-pt  History of Present Illness: Antonio Adams is a 74 y.o. male   Renal mass; renal cell carcinoma Right upper pole partial nephrectomy 09/08/15 with Dr Louis Meckel Follow ups all neg til 2020  11/14/2018 CT abdomen shows some nodularity adjacent to the right hepatic lobe and right kidney 11/11/2019 CT demonstrates progressive right nodular retroperitoneal and adrenal disease 11/27/2019 follow-up MRI abdomen with contrast demonstrates 3  lesions around the nephrectomy site.  The largest 2.9 cm appears to involve adrenal and  invade hepatic segment 6.  2 smaller nodular regions posterior to the right kidney in the retroperitoneum.  Pt was seen in consultation with Dr Vernard Gambles 12/17/19 for recurrent renal cell cancer My impression is that this patient has multifocal locally recurrent renal cell carcinoma in the right retroperitoneum invading the liver and  involving right adrenal gland.  No evidence of distal metastatic disease. Core biopsy would be useful to confirm diagnosis. Reviewed the findings with the patient.  I reviewed the treatment options including watchful waiting, open surgery, and percutaneous ablation.  We discussed in detail the CT-guided percutaneous ablation techniques under anesthesia, anticipated benefits, possible risks and complications.  He seemed to understand and asked appropriate questions which were answered.  He is motivated to proceed with treatment of these recurrent lesions.  We will start with Percutaneous core biopsy samples to confirm recurrence. Treatment if indicated may require a combination of surgical (adrenalectomy) and percutaneous ablation.  Pt now scheduled for liver lesion biopsy   Past Medical History:  Diagnosis Date  . ADENOCARCINOMA, PROSTATE, GLEASON GRADE 6  12/21/2009   prostate cancer  . ALLERGIC RHINITIS 03/16/2007  . Allergy   . Anxiety   . Arthritis    back   . BACK PAIN WITH RADICULOPATHY 11/23/2008  . Cancer of kidney ([name])    partial right kidney removed  . CARPAL TUNNEL SYNDROME, BILATERAL 03/16/2007  . Chronic back pain   . Constipation    takes Senokot daily  . Depression    occasionally  . DIABETES MELLITUS, TYPE II 03/16/2007   takes Januvia and MEtformin daily  . GASTROINTESTINAL HEMORRHAGE, HX OF 03/16/2007  . GERD 03/16/2007   takes Omeprazole daily  . Glaucoma    mild - no eye drops  . Hemorrhoids   . History of colon polyps   . HYPERLIPIDEMIA 03/16/2007   takes Crestor daily  . HYPERTENSION 03/16/2007   takes Diltiazem and Lisinopril daily   . Kidney cancer, primary, with metastasis from kidney to other site [facility])   . LEG CRAMPS 05/22/2007  . Nocturia   . Overactive bladder   . PARESTHESIA 05/21/2007  . Proctitis   . Rectal bleeding    Dr.Norins has explained its from the Radiation that he has received  . Rectal bleeding   . Sleep apnea    uses CPAP nightly  . Stomach cancer ([place])   . Urinary frequency    takes Toviaz daily    Past Surgical History:  Procedure Laterality Date  . [place], 2008   repeat surgery; ESI '08  . CARPAL TUNNEL RELEASE     bilateral  . CERVICAL FUSION    . COLONOSCOPY    . colonosocpy    . ESOPHAGOGASTRODUODENOSCOPY     ED  . FLEXIBLE SIGMOIDOSCOPY N/A 01/08/2017  Procedure: FLEXIBLE SIGMOIDOSCOPY;  Surgeon: Irene Shipper, MD;  Location: [facility] ENDOSCOPY;  Service: Endoscopy;  Laterality: N/A;  . HOT HEMOSTASIS N/A 01/08/2017   Procedure: HOT HEMOSTASIS (ARGON PLASMA COAGULATION/BICAP);  Surgeon: Irene Shipper, MD;  Location: [facility] ENDOSCOPY;  Service: Endoscopy;  Laterality: N/A;  . IR RADIOLOGIST EVAL & MGMT  12/17/2019  . LUMBAR LAMINECTOMY  10/30/2011   Procedure: MICRODISCECTOMY LUMBAR LAMINECTOMY;  Surgeon: Jessy Oto, MD;  Location: [place];  Service: Orthopedics;   Laterality: Right;  Right L4-5 and L5-S1 Microdiscectomy  . LUMBAR LAMINECTOMY  06/17/2012   Procedure: MICRODISCECTOMY LUMBAR LAMINECTOMY;  Surgeon: Jessy Oto, MD;  Location: [place];  Service: Orthopedics;  Laterality: N/A;  Right L5-S1 microdiscectomy  . POLYPECTOMY    . PROSTATE SURGERY  march 2012   seed implant  . ROBOTIC ASSITED PARTIAL NEPHRECTOMY Right 09/08/2015   Procedure: XI ROBOTIC ASSITED RIGHT PARTIAL NEPHRECTOMY;  Surgeon: [name], MD;  Location: [facility];  Service: Urology;  Laterality: Right;  Clamp on: 1033 Clamp off: 1103 Total Clamp Time: 30 minutes  . SHOULDER ARTHROSCOPY WITH SUBACROMIAL DECOMPRESSION Left 01/20/2016   Procedure: LEFT SHOULDER ARTHROSCOPY WITH EXTENSIVE  DEBRIDEMENT, ACROMIOPLASTY;  Surgeon: Ninetta Lights, MD;  Location: [place];  Service: Orthopedics;  Laterality: Left;  . SHOULDER SURGERY  LFT  . Stress Cardiolite  12/24/2003   negative for ischemia  . UPPER ESOPHAGEAL ENDOSCOPIC ULTRASOUND (EUS)  04/18/2016   [facility] hospital  . UPPER GASTROINTESTINAL ENDOSCOPY    . 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 Authorizing Provider  amLODipine (NORVASC) 2.5 MG tablet Take 2.5 mg by mouth daily.   Yes [provider]  atorvastatin (LIPITOR) 40 MG tablet Take 1 tablet (40 mg total) by mouth daily. 12/20/19  Yes [name], MD  cholecalciferol (VITAMIN D) 1000 units tablet Take 1,000 Units by mouth daily.   Yes [provider]  citalopram (CELEXA) 40 MG tablet Take 40 mg by mouth daily.  11/16/16  Yes [provider]  ferrous sulfate 325 (65 FE) MG tablet Take 1 tablet (325 mg total) by mouth 2 (two) times daily. Patient needs office visit for further refills Patient taking differently: Take 325 mg by mouth daily with breakfast.  02/24/19  Yes  Irene Shipper, MD  gabapentin (NEURONTIN) 100 MG capsule Take 100 mg by mouth at bedtime.   Yes [provider]  LANTUS SOLOSTAR 100 UNIT/ML Solostar Pen INJECT 50 UNITS INTO THE SKIN EVERY MORNING. Patient taking differently: Inject 30 Units into the skin daily as needed (blood sugar over 200).  04/21/19  Yes [name], MD  tamsulosin (FLOMAX) 0.4 MG CAPS capsule TAKE 1 CAPSULE (0.4 MG TOTAL) BY MOUTH DAILY. Patient taking differently: Take 0.4 mg by mouth daily.  01/20/14  Yes [name], MD  telmisartan (MICARDIS) 40 MG tablet Take 1 tablet (40 mg total) by mouth daily. 12/19/19  Yes [name], MD  Tetrahydrozoline HCl (VISINE OP) Place 1 drop into both eyes daily as needed (redness).   Yes [provider]  clotrimazole-betamethasone (LOTRISONE) cream APPLY TO AFFECTED AREA TWICE A DAY Patient not taking: Reported on 12/31/2019 10/14/19   [name], MD  Continuous Blood Gluc Receiver (FREESTYLE LIBRE 14 DAY READER) DEVI 1 Act by Does  not apply route daily. 07/01/19   [name], MD  Continuous Blood Gluc Sensor (FREESTYLE LIBRE 14 DAY SENSOR) MISC USE AS DIRECTED WITH READER 07/02/19   [name], MD  diclofenac Sodium (VOLTAREN) 1 % GEL Apply 4 g topically 4 (four) times daily. Patient not taking: Reported on 12/31/2019 06/24/19   Jessy Oto, MD  Finerenone (KERENDIA) 10 MG TABS Take 1 tablet by mouth daily. Patient not taking: Reported on 12/31/2019 12/22/19   [name], MD  fluticasone [facility]) 50 MCG/ACT nasal spray Place 2 sprays into both nostrils daily. Patient not taking: Reported on 12/31/2019 09/27/18   Marrian Salvage, FNP  gabapentin (NEURONTIN) 300 MG capsule Take 1 capsule (300 mg total) by mouth at bedtime. Patient not taking: Reported on 12/31/2019 08/19/18   Jessy Oto, MD  indapamide (LOZOL) 1.25 MG tablet Take 1 tablet (1.25 mg total) by mouth daily. Patient not taking: Reported on 12/31/2019 12/18/19   [name], MD    Insulin Pen Needle (NOVOFINE) 32G X 6 MM MISC 1 Act by Does not apply route daily. USE TID 07/04/19   [name], MD  l-methylfolate-B6-B12 [facility]) 3-35-2 MG TABS tablet Take 1 tablet by mouth daily. Patient not taking: Reported on 12/31/2019 12/18/19   [name], MD  omeprazole (PRILOSEC) 40 MG capsule TAKE ONE CAPSULE EVERY DAY Patient not taking: Reported on 12/31/2019 02/24/13   Neena Rhymes, MD  ONE TOUCH ULTRA TEST test strip USE 2 (TWO) TIMES DAILY. AND LANCETS 2/DAY 250.03 11/05/16   [name], MD     Family History  Problem Relation Age of Onset  . Cancer Sister 30       Cancer, unsure type  . Hypertension Other   . Colon cancer Neg Hx   . Esophageal cancer Neg Hx   . Stomach cancer Neg Hx   . Diabetes Neg Hx   . Colon polyps Neg Hx   . Rectal cancer Neg Hx     Social History   Socioeconomic History  . Marital status: Widowed    Spouse name: Deneise Lever  . Number of children: 3  . Years of education: 12+  . Highest education level: Not on file  Occupational History  . 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 Smoking status: Never Smoker  . Smokeless tobacco: Never Used  Vaping Use  . Vaping Use: Never used  Substance and Sexual Activity  . Alcohol use: Not Currently    Alcohol/week: 0.0 standard drinks    Comment: occasional  . Drug use: No  . Sexual activity: Not Currently    Partners: Female  Other Topics Concern  . Not on file  Social History Narrative   Married-divorced '89, remarried '96. 2 sons, 1 daughter. 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 He wishes to be a full resuscitation candidate.       Social Determinants of Health   Financial Resource Strain:   . Difficulty of Paying Living Expenses: Not on file  Food Insecurity:   . Worried About Charity fundraiser in the Last Year: Not on file  . Ran  Out of Food in the Last Year: Not on file  Transportation Needs:   . Lack of Transportation (Medical): Not on file  . Lack of Transportation (Non-Medical): Not on file  Physical  Activity:   . Days of Exercise per Week: Not on file  . Minutes of Exercise per Session: Not on file  Stress:   . Feeling of Stress : Not on file  Social Connections:   . Frequency of Communication with Friends and Family: Not on file  . Frequency of Social Gatherings with Friends and Family: Not on file  . Attends Religious Services: Not on file  . Active Member of Clubs or Organizations: Not on file  . Attends Archivist Meetings: Not on file  . Marital Status: Not on file     Review of Systems: A 12 point ROS discussed and pertinent positives are indicated in the HPI above.  All other systems are negative.  Review of Systems  Constitutional: Negative for activity change, fatigue and fever.  Respiratory: Negative for cough and shortness of breath.   Cardiovascular: Negative for chest pain.  Gastrointestinal: Negative for abdominal pain.  Musculoskeletal: Negative for back pain.  Neurological: Negative for weakness.  Psychiatric/Behavioral: Negative for behavioral problems and confusion.    Vital Signs: BP (!) 163/85   Pulse 78   Temp 97.9 F (36.6 C) (Oral)   Resp 16   Ht 5\' 11"  (1.803 m)   Wt 220 lb (99.8 kg)   SpO2 99%   BMI 30.68 kg/m   Physical Exam Vitals reviewed.  HENT:     Mouth/Throat:     Mouth: Mucous membranes are moist.  Cardiovascular:     Rate and Rhythm: Normal rate and regular rhythm.     Heart sounds: Normal heart sounds.  Pulmonary:     Effort: Pulmonary effort is normal.     Breath sounds: Normal breath sounds.  Abdominal:     Palpations: Abdomen is soft.  Musculoskeletal:        General: Normal range of motion.  Skin:    General: Skin is warm.  Neurological:     Mental Status: He is alert and oriented to person, place, and time.  Psychiatric:         Behavior: Behavior normal.     Imaging: IR Radiologist Eval & Mgmt  Result Date: 12/17/2019 Please refer to notes tab for details about interventional procedure. (Op Note)   Labs:  CBC: Recent Labs    03/23/19 1906 04/02/19 1406 11/21/19 1849 12/18/19 1425  WBC 3.8* 3.8* 3.0* 3.3*  HGB 11.2* 10.8* 9.9* 10.4*  HCT 34.7* 31.1* 29.5* 30.8*  PLT 203 205.0 209 197    COAGS: No results for input(s): INR, APTT in the last 8760 hours.  BMP: Recent Labs    03/23/19 1906 04/02/19 1406 11/21/19 1849 12/18/19 1425  NA 136 140 134* 139  K 4.8 4.5 4.7 5.1  CL 107 106 105 107  CO2 22 24 21* 27  GLUCOSE 80 176* 107* 93  BUN 32* 29* 31* 24  CALCIUM 10.2 9.8 9.6 10.0  CREATININE 2.10* 2.23* 2.17* 2.11*  GFRNONAA 31*  --  29* 30*  GFRAA 35*  --  34* 35*    LIVER FUNCTION TESTS: No results for input(s): BILITOT, AST, ALT, ALKPHOS, PROT, ALBUMIN in the last 8760 hours.  TUMOR MARKERS: No results for input(s): AFPTM, CEA, CA199, CHROMGRNA in the last 8760 hours.  Assessment and Plan:  Known renal cell carcinoma New hepatic lesion Scheduled for liver lesion biopsy Risks and benefits of liver lesion biopsy was discussed with the patient and/or patient's family including, but not limited to bleeding, infection, damage to adjacent structures or low  yield requiring additional tests.  All of the questions were answered and there is agreement to proceed. Consent signed and in chart.   Thank you for this interesting consult.  I greatly enjoyed meeting PHAROAH GOGGINS and look forward to participating in their care.  A copy of this report was sent to the requesting provider on this date.  Electronically Signed: Lavonia Drafts, PA-C 01/05/2020, 9:42 AM   I spent a total of  30 Minutes   in face to face in clinical consultation, greater than 50% of which was counseling/coordinating care for liver lesion biospy

## 2020-01-07 LAB — SURGICAL PATHOLOGY

## 2020-01-12 DIAGNOSIS — C641 Malignant neoplasm of right kidney, except renal pelvis: Secondary | ICD-10-CM | POA: Diagnosis not present

## 2020-01-12 DIAGNOSIS — C7971 Secondary malignant neoplasm of right adrenal gland: Secondary | ICD-10-CM | POA: Diagnosis not present

## 2020-01-17 ENCOUNTER — Other Ambulatory Visit: Payer: Self-pay | Admitting: Internal Medicine

## 2020-01-17 DIAGNOSIS — E118 Type 2 diabetes mellitus with unspecified complications: Secondary | ICD-10-CM

## 2020-01-20 ENCOUNTER — Other Ambulatory Visit: Payer: Self-pay | Admitting: General Surgery

## 2020-01-20 ENCOUNTER — Encounter: Payer: Self-pay | Admitting: Internal Medicine

## 2020-01-20 DIAGNOSIS — K8020 Calculus of gallbladder without cholecystitis without obstruction: Secondary | ICD-10-CM | POA: Diagnosis not present

## 2020-01-20 DIAGNOSIS — R160 Hepatomegaly, not elsewhere classified: Secondary | ICD-10-CM | POA: Diagnosis not present

## 2020-01-20 DIAGNOSIS — C641 Malignant neoplasm of right kidney, except renal pelvis: Secondary | ICD-10-CM | POA: Diagnosis not present

## 2020-01-21 ENCOUNTER — Other Ambulatory Visit: Payer: Self-pay | Admitting: Interventional Radiology

## 2020-01-21 DIAGNOSIS — N2889 Other specified disorders of kidney and ureter: Secondary | ICD-10-CM

## 2020-01-22 ENCOUNTER — Other Ambulatory Visit: Payer: Self-pay | Admitting: Internal Medicine

## 2020-01-22 DIAGNOSIS — E118 Type 2 diabetes mellitus with unspecified complications: Secondary | ICD-10-CM

## 2020-01-26 ENCOUNTER — Other Ambulatory Visit: Payer: Self-pay | Admitting: Urology

## 2020-01-31 ENCOUNTER — Other Ambulatory Visit: Payer: Self-pay | Admitting: Internal Medicine

## 2020-01-31 DIAGNOSIS — E118 Type 2 diabetes mellitus with unspecified complications: Secondary | ICD-10-CM

## 2020-02-02 NOTE — Telephone Encounter (Signed)
Please call patient re: refill. The patient states the pharmacy has not refilled his medication, may be waiting on a diagnosis code?  JANUMET XR 50-1000 MG TB24  [id number] [address], [place] - [place] Phone:  [phone]  Fax:  [phone]

## 2020-02-03 NOTE — Telephone Encounter (Signed)
Pharmacist stated the pt picked up this medication on 9/18.

## 2020-02-13 NOTE — Patient Instructions (Addendum)
DUE TO COVID-19 ONLY ONE VISITOR IS ALLOWED TO COME WITH YOU AND STAY IN THE WAITING ROOM ONLY  DURING PRE OP AND PROCEDURE.   IF YOU WILL BE ADMITTED INTO THE HOSPITAL YOU ARE ALLOWED ONE SUPPORT PERSON DURING  VISITATION HOURS ONLY (10AM -8PM)   . The support person may change daily. . The support person must pass our screening, gel in and out, and wear a mask at all times, including in the patient's room. . Patients must also wear a mask when staff or their support person are in the room.   COVID SWAB TESTING MUST BE COMPLETED ON:  Monday, 02-23-20 @ 10:00 AM   [address] [place], [place] [address]  (Must self quarantine after testing. Follow instructions on  handout.)        Your procedure is scheduled on:  Thursday, 02-26-20   Report to [facility] Main  Entrance    Report to admitting at 7:30 AM   Call this number if you have problems the morning of surgery [phone]       Do not eat food :After Midnight.   May have liquids until 6:30 AM  day of surgery  CLEAR LIQUID DIET  Foods Allowed                                                                     Foods Excluded  Water, Black Coffee and tea, regular and decaf             liquids that you cannot  Plain Jell-O in any flavor  (No red)                                    see through such as: Fruit ices (not with fruit pulp)                                      milk, soups, orange juice              Iced Popsicles (No red)                                      All solid food                                   Apple juices Sports drinks like Gatorade (No red) Lightly seasoned clear broth or consume(fat free) Sugar, honey syrup     Oral Hygiene is also important to reduce your risk of infection.                                    Remember - BRUSH YOUR TEETH THE MORNING OF SURGERY WITH YOUR REGULAR TOOTHPASTE   Do NOT smoke after Midnight   Take these medicines the morning of surgery with A SIP OF WATER:   Amlodipine, Atorvastatin, Citalopram,  Diltiazem, Gabapentin  How to Manage Your Diabetes Before and After Surgery  Why is it important to control my blood sugar before and after surgery? . Improving blood sugar levels before and after surgery helps healing and can limit problems. . A way of improving blood sugar control is eating a healthy diet by: o  Eating less sugar and carbohydrates o  Increasing activity/exercise o  Talking with your doctor about reaching your blood sugar goals . High blood sugars (greater than 180 mg/dL) can raise your risk of infections and slow your recovery, so you will need to focus on controlling your diabetes during the weeks before surgery. . Make sure that the doctor who takes care of your diabetes knows about your planned surgery including the date and location.  How do I manage my blood sugar before surgery? . Check your blood sugar at least 4 times a day, starting 2 days before surgery, to make sure that the level is not too high or low. o Check your blood sugar the morning of your surgery when you wake up and every 2 hours until you get to the Short Stay unit. . If your blood sugar is less than 70 mg/dL, you will need to treat for low blood sugar: o Do not take insulin. o Treat a low blood sugar (less than 70 mg/dL) with  cup of clear juice (cranberry or apple), 4 glucose tablets, OR glucose gel. o Recheck blood sugar in 15 minutes after treatment (to make sure it is greater than 70 mg/dL). If your blood sugar is not greater than 70 mg/dL on recheck, call [phone] for further instructions. . Report your blood sugar to the short stay nurse when you get to Short Stay.  . If you are admitted to the hospital after surgery: o Your blood sugar will be checked by the staff and you will probably be given insulin after surgery (instead of oral diabetes medicines) to make sure you have good blood sugar levels. o The goal for blood sugar control after surgery is  80-180 mg/dL.   WHAT DO I DO ABOUT MY DIABETES MEDICATION?  Marland Kitchen Do not take oral diabetes medicines (pills) the morning of surgery.  . THE DAY BEFORE SURGERY:  Take Janumet as prescribed. Lantus Solostar 30 units am if blood sugar greater than 200   . THE MORNING OF SURGERY:  Do not take Petrey.  Lantus Solostar 15 units in am if blood sugar greater than 200   Reviewed and Endorsed by [facility] Patient Education Committee, August 2015                               You may not have any metal on your body including jewelry, and body piercings             Do not wear  lotions, powders, perfumes/cologne, or deodorant             Men may shave face and neck.   Do not bring valuables to the hospital. [place].   Contacts, dentures or bridgework may not be worn into surgery.   Bring small overnight bag day of surgery.                Please read over the following fact sheets you were given: IF YOU HAVE QUESTIONS ABOUT YOUR PRE OP INSTRUCTIONS PLEASE CALL [facility] -  Preparing for Surgery Before surgery, you can play an important role.  Because skin is not sterile, your skin needs to be as free of germs as possible.  You can reduce the number of germs on your skin by washing with CHG (chlorahexidine gluconate) soap before surgery.  CHG is an antiseptic cleaner which kills germs and bonds with the skin to continue killing germs even after washing. Please DO NOT use if you have an allergy to CHG or antibacterial soaps.  If your skin becomes reddened/irritated stop using the CHG and inform your nurse when you arrive at Short Stay. Do not shave (including legs and underarms) for at least 48 hours prior to the first CHG shower.  You may shave your face/neck.  Please follow these instructions carefully:  1.  Shower with CHG Soap the night before surgery and the  morning of surgery.  2.  If you choose to wash your hair, wash your hair first  as usual with your normal  shampoo.  3.  After you shampoo, rinse your hair and body thoroughly to remove the shampoo.                             4.  Use CHG as you would any other liquid soap.  You can apply chg directly to the skin and wash.  Gently with a scrungie or clean washcloth.  5.  Apply the CHG Soap to your body ONLY FROM THE NECK DOWN.   Do   not use on face/ open                           Wound or open sores. Avoid contact with eyes, ears mouth and   genitals (private parts).                       Wash face,  Genitals (private parts) with your normal soap.             6.  Wash thoroughly, paying special attention to the area where your    surgery  will be performed.  7.  Thoroughly rinse your body with warm water from the neck down.  8.  DO NOT shower/wash with your normal soap after using and rinsing off the CHG Soap.                9.  Pat yourself dry with a clean towel.            10.  Wear clean pajamas.            11.  Place clean sheets on your bed the night of your first shower and do not  sleep with pets. Day of Surgery : Do not apply any lotions/deodorants the morning of surgery.  Please wear clean clothes to the hospital/surgery center.  FAILURE TO FOLLOW THESE INSTRUCTIONS MAY RESULT IN THE CANCELLATION OF YOUR SURGERY  PATIENT SIGNATURE_________________________________  NURSE SIGNATURE__________________________________  ________________________________________________________________________  WHAT IS A BLOOD TRANSFUSION? Blood Transfusion Information  A transfusion is the replacement of blood or some of its parts. Blood is made up of multiple cells which provide different functions.  Red blood cells carry oxygen and are used for blood loss replacement.  White blood cells fight against infection.  Platelets control bleeding.  Plasma helps clot blood.  Other blood products are available for specialized needs, such as hemophilia or  other clotting  disorders. BEFORE THE TRANSFUSION  Who gives blood for transfusions?   Healthy volunteers who are fully evaluated to make sure their blood is safe. This is blood bank blood. Transfusion therapy is the safest it has ever been in the practice of medicine. Before blood is taken from a donor, a complete history is taken to make sure that person has no history of diseases nor engages in risky social behavior (examples are intravenous drug use or sexual activity with multiple partners). The donor's travel history is screened to minimize risk of transmitting infections, such as malaria. The donated blood is tested for signs of infectious diseases, such as HIV and hepatitis. The blood is then tested to be sure it is compatible with you in order to minimize the chance of a transfusion reaction. If you or a relative donates blood, this is often done in anticipation of surgery and is not appropriate for emergency situations. It takes many days to process the donated blood. RISKS AND COMPLICATIONS Although transfusion therapy is very safe and saves many lives, the main dangers of transfusion include:   Getting an infectious disease.  Developing a transfusion reaction. This is an allergic reaction to something in the blood you were given. Every precaution is taken to prevent this. The decision to have a blood transfusion has been considered carefully by your caregiver before blood is given. Blood is not given unless the benefits outweigh the risks. AFTER THE TRANSFUSION  Right after receiving a blood transfusion, you will usually feel much better and more energetic. This is especially true if your red blood cells have gotten low (anemic). The transfusion raises the level of the red blood cells which carry oxygen, and this usually causes an energy increase.  The nurse administering the transfusion will monitor you carefully for complications. HOME CARE INSTRUCTIONS  No special instructions are needed after a  transfusion. You may find your energy is better. Speak with your caregiver about any limitations on activity for underlying diseases you may have. SEEK MEDICAL CARE IF:   Your condition is not improving after your transfusion.  You develop redness or irritation at the intravenous (IV) site. SEEK IMMEDIATE MEDICAL CARE IF:  Any of the following symptoms occur over the next 12 hours:  Shaking chills.  You have a temperature by mouth above 102 F (38.9 C), not controlled by medicine.  Chest, back, or muscle pain.  People around you feel you are not acting correctly or are confused.  Shortness of breath or difficulty breathing.  Dizziness and fainting.  You get a rash or develop hives.  You have a decrease in urine output.  Your urine turns a dark color or changes to pink, red, or brown. Any of the following symptoms occur over the next 10 days:  You have a temperature by mouth above 102 F (38.9 C), not controlled by medicine.  Shortness of breath.  Weakness after normal activity.  The white part of the eye turns yellow (jaundice).  You have a decrease in the amount of urine or are urinating less often.  Your urine turns a dark color or changes to pink, red, or brown. Document Released: 04/28/2000 Document Revised: 07/24/2011 Document Reviewed: 12/16/2007 [facility] Patient Information 2014 [place], [place].  _______________________________________________________________________

## 2020-02-13 NOTE — Progress Notes (Addendum)
COVID Vaccine Completed:  x2 Date COVID Vaccine completed:  Unsure of date COVID vaccine manufacturer: [employer]   PCP - Scarlette Calico, MD Cardiologist -   Chest x-ray - 11-11-19 in Epic EKG - 12-18-19 in Epic Stress Test -  ECHO -  Cardiac Cath -  Pacemaker/ICD device last checked: Cardiac monitoring - 11-13-18 in Epic  Sleep Study - 5+ years ago CPAP - stopped 2 years ago  Fasting Blood Sugar - 114 to 200 Checks Blood Sugar - daily  Blood Thinner Instructions: Aspirin Instructions:  ASA 81 mg Last Dose:  Anesthesia review:  Tachycardia, hx of cardiac monitoring.   Sleep apnea  Patient denies shortness of breath, fever, cough and chest pain at PAT appointment   Patient verbalized understanding of instructions that were given to them at the PAT appointment. Patient was also instructed that they will need to review over the PAT instructions again at home before surgery.

## 2020-02-16 ENCOUNTER — Other Ambulatory Visit: Payer: Self-pay

## 2020-02-16 ENCOUNTER — Encounter (HOSPITAL_COMMUNITY)
Admission: RE | Admit: 2020-02-16 | Discharge: 2020-02-16 | Disposition: A | Discharge status: Home / Self Care | Payer: Medicare Other | Source: Ambulatory Visit | Attending: General Surgery | Admitting: General Surgery

## 2020-02-16 ENCOUNTER — Ambulatory Visit: Payer: Medicare Other | Admitting: Neurology

## 2020-02-16 DIAGNOSIS — Z01812 Encounter for preprocedural laboratory examination: Secondary | ICD-10-CM | POA: Diagnosis not present

## 2020-02-16 DIAGNOSIS — C641 Malignant neoplasm of right kidney, except renal pelvis: Secondary | ICD-10-CM | POA: Diagnosis not present

## 2020-02-16 LAB — CBC WITH DIFFERENTIAL/PLATELET
Abs Immature Granulocytes: 0 10*3/uL (ref 0.00–0.07)
Basophils Absolute: 0 10*3/uL (ref 0.0–0.1)
Basophils Relative: 1 %
Eosinophils Absolute: 0.1 10*3/uL (ref 0.0–0.5)
Eosinophils Relative: 2 %
HCT: 29.9 % — ABNORMAL LOW (ref 39.0–52.0)
Hemoglobin: 10 g/dL — ABNORMAL LOW (ref 13.0–17.0)
Immature Granulocytes: 0 %
Lymphocytes Relative: 27 %
Lymphs Abs: 0.9 10*3/uL (ref 0.7–4.0)
MCH: 29.9 pg (ref 26.0–34.0)
MCHC: 33.4 g/dL (ref 30.0–36.0)
MCV: 89.3 fL (ref 80.0–100.0)
Monocytes Absolute: 0.5 10*3/uL (ref 0.1–1.0)
Monocytes Relative: 15 %
Neutro Abs: 1.9 10*3/uL (ref 1.7–7.7)
Neutrophils Relative %: 55 %
Platelets: 209 10*3/uL (ref 150–400)
RBC: 3.35 MIL/uL — ABNORMAL LOW (ref 4.22–5.81)
RDW: 14.6 % (ref 11.5–15.5)
WBC: 3.5 10*3/uL — ABNORMAL LOW (ref 4.0–10.5)
nRBC: 0 % (ref 0.0–0.2)

## 2020-02-16 LAB — COMPREHENSIVE METABOLIC PANEL
ALT: 20 U/L (ref 0–44)
AST: 21 U/L (ref 15–41)
Albumin: 3.8 g/dL (ref 3.5–5.0)
Alkaline Phosphatase: 92 U/L (ref 38–126)
Anion gap: 7 (ref 5–15)
BUN: 28 mg/dL — ABNORMAL HIGH (ref 8–23)
CO2: 22 mmol/L (ref 22–32)
Calcium: 9.7 mg/dL (ref 8.9–10.3)
Chloride: 107 mmol/L (ref 98–111)
Creatinine, Ser: 2.22 mg/dL — ABNORMAL HIGH (ref 0.61–1.24)
GFR calc Af Amer: 33 mL/min — ABNORMAL LOW (ref >60)
GFR calc non Af Amer: 28 mL/min — ABNORMAL LOW (ref >60)
Glucose, Bld: 95 mg/dL (ref 70–99)
Potassium: 4.8 mmol/L (ref 3.5–5.1)
Sodium: 136 mmol/L (ref 135–145)
Total Bilirubin: 0.4 mg/dL (ref 0.3–1.2)
Total Protein: 7.1 g/dL (ref 6.5–8.1)

## 2020-02-16 LAB — GLUCOSE, CAPILLARY: Glucose-Capillary: 83 mg/dL (ref 70–99)

## 2020-02-16 LAB — PROTIME-INR
INR: 0.9 (ref 0.8–1.2)
Prothrombin Time: 12.2 seconds (ref 11.4–15.2)

## 2020-02-16 LAB — HEMOGLOBIN A1C
Hgb A1c MFr Bld: 6.8 % — ABNORMAL HIGH (ref 4.8–5.6)
Mean Plasma Glucose: 148.46 mg/dL

## 2020-02-16 LAB — PREPARE RBC (CROSSMATCH)

## 2020-02-16 NOTE — Progress Notes (Signed)
CBC with Diff and CMP sent to Dr. Barry Dienes and Dr. Louis Meckel to review.

## 2020-02-19 ENCOUNTER — Other Ambulatory Visit: Payer: Self-pay | Admitting: Internal Medicine

## 2020-02-20 NOTE — Telephone Encounter (Signed)
Patient states that he has a week and a half of medicine left Please refill

## 2020-02-23 ENCOUNTER — Other Ambulatory Visit (HOSPITAL_COMMUNITY)
Admission: RE | Admit: 2020-02-23 | Discharge: 2020-02-23 | Disposition: A | Discharge status: Home / Self Care | Payer: Medicare Other | Source: Ambulatory Visit | Attending: General Surgery | Admitting: General Surgery

## 2020-02-23 DIAGNOSIS — K8020 Calculus of gallbladder without cholecystitis without obstruction: Secondary | ICD-10-CM | POA: Diagnosis not present

## 2020-02-23 DIAGNOSIS — Z20822 Contact with and (suspected) exposure to covid-19: Secondary | ICD-10-CM | POA: Insufficient documentation

## 2020-02-23 DIAGNOSIS — Z905 Acquired absence of kidney: Secondary | ICD-10-CM | POA: Diagnosis not present

## 2020-02-23 DIAGNOSIS — C7971 Secondary malignant neoplasm of right adrenal gland: Secondary | ICD-10-CM | POA: Diagnosis not present

## 2020-02-23 DIAGNOSIS — K660 Peritoneal adhesions (postprocedural) (postinfection): Secondary | ICD-10-CM | POA: Diagnosis not present

## 2020-02-23 DIAGNOSIS — I517 Cardiomegaly: Secondary | ICD-10-CM | POA: Diagnosis not present

## 2020-02-23 DIAGNOSIS — Z85528 Personal history of other malignant neoplasm of kidney: Secondary | ICD-10-CM | POA: Diagnosis not present

## 2020-02-23 DIAGNOSIS — J9811 Atelectasis: Secondary | ICD-10-CM | POA: Diagnosis not present

## 2020-02-23 DIAGNOSIS — Z01812 Encounter for preprocedural laboratory examination: Secondary | ICD-10-CM | POA: Insufficient documentation

## 2020-02-23 DIAGNOSIS — D62 Acute posthemorrhagic anemia: Secondary | ICD-10-CM | POA: Diagnosis not present

## 2020-02-23 DIAGNOSIS — K8010 Calculus of gallbladder with chronic cholecystitis without obstruction: Secondary | ICD-10-CM | POA: Diagnosis not present

## 2020-02-23 DIAGNOSIS — E8770 Fluid overload, unspecified: Secondary | ICD-10-CM | POA: Diagnosis not present

## 2020-02-23 DIAGNOSIS — Z8546 Personal history of malignant neoplasm of prostate: Secondary | ICD-10-CM | POA: Diagnosis not present

## 2020-02-23 DIAGNOSIS — G8918 Other acute postprocedural pain: Secondary | ICD-10-CM | POA: Diagnosis not present

## 2020-02-23 DIAGNOSIS — R509 Fever, unspecified: Secondary | ICD-10-CM | POA: Diagnosis not present

## 2020-02-23 DIAGNOSIS — R21 Rash and other nonspecific skin eruption: Secondary | ICD-10-CM | POA: Diagnosis not present

## 2020-02-23 DIAGNOSIS — C7900 Secondary malignant neoplasm of unspecified kidney and renal pelvis: Secondary | ICD-10-CM | POA: Diagnosis not present

## 2020-02-23 DIAGNOSIS — R0602 Shortness of breath: Secondary | ICD-10-CM | POA: Diagnosis not present

## 2020-02-23 DIAGNOSIS — C801 Malignant (primary) neoplasm, unspecified: Secondary | ICD-10-CM | POA: Diagnosis not present

## 2020-02-23 DIAGNOSIS — C7901 Secondary malignant neoplasm of right kidney and renal pelvis: Secondary | ICD-10-CM | POA: Diagnosis not present

## 2020-02-23 DIAGNOSIS — N179 Acute kidney failure, unspecified: Secondary | ICD-10-CM | POA: Diagnosis not present

## 2020-02-23 DIAGNOSIS — G4730 Sleep apnea, unspecified: Secondary | ICD-10-CM | POA: Diagnosis not present

## 2020-02-23 DIAGNOSIS — C786 Secondary malignant neoplasm of retroperitoneum and peritoneum: Secondary | ICD-10-CM | POA: Diagnosis not present

## 2020-02-23 DIAGNOSIS — C641 Malignant neoplasm of right kidney, except renal pelvis: Secondary | ICD-10-CM | POA: Diagnosis not present

## 2020-02-23 DIAGNOSIS — C787 Secondary malignant neoplasm of liver and intrahepatic bile duct: Secondary | ICD-10-CM | POA: Diagnosis not present

## 2020-02-23 DIAGNOSIS — N183 Chronic kidney disease, stage 3 unspecified: Secondary | ICD-10-CM | POA: Diagnosis not present

## 2020-02-23 DIAGNOSIS — E1122 Type 2 diabetes mellitus with diabetic chronic kidney disease: Secondary | ICD-10-CM | POA: Diagnosis not present

## 2020-02-23 LAB — SARS CORONAVIRUS 2 (TAT 6-24 HRS): SARS Coronavirus 2: NEGATIVE

## 2020-02-23 NOTE — H&P (Signed)
Antonio Adams Location: [facility] Patient #: [id number] DOB: 06/24/1945 Single / Language: Cleophus Molt / Race: Black or African American Male   History of Present Illness The patient is a 74 year old male who presents with an abdominal mass. Pt is a 74 yo M referred by Dr. Louis Meckel for recurrent right renal cell cancer. He had robotic assisted partial right nephrectomy in April 2017. He also has h/o prostate cancer treated with brachytherapy seeds. He has been undergoing surveillance scans and new findings were seen in the surgical bed. I am asked to see for possible hepatic involvement. He is mostly feeling well, but has occasional right back/flank pain.   MR 11/27/2019 IMPRESSION: 1. Three new lesions on the right are concerning for recurrent malignancy from the prior renal cell carcinoma. First of all, there is increasing nodularity along bandlike scarring along the posterior periphery of the nephrectomy site. Secondly, there is a new (from last year) enhancing nodule along the posterior renal fascia below the level of the kidney. Finally, there is a new right adrenal nodule with nonspecific imaging characteristics very closely associated with the adjacent right hepatic lobe capsule. 2. Pancreas divisum. 3. Cholelithiasis.  Adrenal biopsy 01/05/2020 FINAL MICROSCOPIC DIAGNOSIS:  A. 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 Shoulder Surgery  Left. Spinal Surgery - Lower Back   Diagnostic Studies History Colonoscopy  1-5 years ago  Allergies  No Known Drug Allergies   Allergies Reconciled   Medication History  Atorvastatin Calcium (40MG Tablet, Oral) Active. Telmisartan (40MG Tablet, Oral) Active. Indapamide (1.25MG Tablet, Oral) Active. amLODIPine Besylate (2.5MG Tablet, Oral) Active. Gabapentin (100MG  Capsule, Oral) Active. Tetrahydrozoline HCl (0.05% Solution, Ophthalmic) Active. Sildenafil Citrate (Oral) Specific strength unknown - Active. Insulin Pen Needle Specific strength unknown - Active. Blood Glucose Monitor System (w/Device Kit,) Active. Medications Reconciled  Social History  Alcohol use  Occasional alcohol use. Caffeine use  Coffee. No drug use  Tobacco use  Never smoker.  Other Problems  Cancer  Diabetes Mellitus  High blood pressure  Sleep Apnea     Review of Systems General Present- Weight Loss. Not Present- Appetite Loss, Chills, Fatigue, Fever, Night Sweats and Weight Gain. HEENT Present- Ringing in the Ears. Not Present- Earache, Hearing Loss, Hoarseness, Nose Bleed, Oral Ulcers, Seasonal Allergies, Sinus Pain, Sore Throat, Visual Disturbances, Wears glasses/contact lenses and Yellow Eyes. Cardiovascular Present- Leg Cramps. Not Present- Chest Pain, Difficulty Breathing Lying Down, Palpitations, Rapid Heart Rate, Shortness of Breath and Swelling of Extremities. Gastrointestinal Present- Bloody Stool. Not Present- Abdominal Pain, Bloating, Change in Bowel Habits, Chronic diarrhea, Constipation, Difficulty Swallowing, Excessive gas, Gets full quickly at meals, Hemorrhoids, Indigestion, Nausea, Rectal Pain and Vomiting. Musculoskeletal Present- Back Pain and Joint Pain. Not Present- Joint Stiffness, Muscle Pain, Muscle Weakness and Swelling of Extremities. Neurological Present- Numbness and Trouble walking. Not Present- Decreased Memory, Fainting, Headaches, Seizures, Tingling, Tremor and Weakness. Psychiatric Present- Change in Sleep Pattern. Not Present- Anxiety, Bipolar, Depression, Fearful and Frequent crying. Hematology Not Present- Blood Thinners, Easy Bruising, Excessive bleeding, Gland problems, HIV and Persistent Infections.  Vitals  Weight: 211.13 lb Height: 70in Body Surface Area: 2.14 m Body Mass Index: 30.29 kg/m  Temp.: 97.22F   Pulse: 97 (Regular)  P.OX: 97% (Room air) BP: 130/80(Sitting, Left Arm, Standard)       Physical Exam General Mental Status-Alert. General Appearance-Consistent with stated age. Hydration-Well hydrated.  Voice-Normal.  Head and Neck Head-normocephalic, atraumatic with no lesions or palpable masses. Trachea-midline. Thyroid Gland Characteristics - normal size and consistency.  Eye Eyeball - Bilateral-Extraocular movements intact. Sclera/Conjunctiva - Bilateral-No scleral icterus.  Chest and Lung Exam Chest and lung exam reveals -quiet, even and easy respiratory effort with no use of accessory muscles and on auscultation, normal breath sounds, no adventitious sounds and normal vocal resonance. Inspection Chest Wall - Normal. Back - normal.  Cardiovascular Cardiovascular examination reveals -normal heart sounds, regular rate and rhythm with no murmurs and normal pedal pulses bilaterally.  Abdomen Inspection Inspection of the abdomen reveals - No Hernias. Palpation/Percussion Palpation and Percussion of the abdomen reveal - Soft, Non Tender, No Rebound tenderness, No Rigidity (guarding) and No hepatosplenomegaly. Auscultation Auscultation of the abdomen reveals - Bowel sounds normal.  Neurologic Neurologic evaluation reveals -alert and oriented x 3 with no impairment of recent or remote memory. Mental Status-Normal.  Musculoskeletal Global Assessment -Note: no gross deformities.  Normal Exam - Left-Upper Extremity Strength Normal and Lower Extremity Strength Normal. Normal Exam - Right-Upper Extremity Strength Normal and Lower Extremity Strength Normal.  Lymphatic Head & Neck  General Head & Neck Lymphatics: Bilateral - Description - Normal. Axillary  General Axillary Region: Bilateral - Description - Normal. Tenderness - Non Tender. Femoral & Inguinal  Generalized Femoral & Inguinal Lymphatics: Bilateral - Description - No  Generalized lymphadenopathy.    Assessment & Plan  RECURRENT RENAL CELL CARCINOMA OF RIGHT KIDNEY (C64.1) Impression: Will plan surgery with Dr. Louis Meckel. He plans radical nephrectomy, adrenalectomy. I would plan possible partial hepatectomy if metastatic disease is invading liver as it appears to be on the scan.  I discussed risks including bleeding, infection, bile leak, possible transfusion, heart/lung issues, blood clot, death. I also discussed risk of recurrent cancer.  I advised him that this would be open surgery and that he would be in the hospital probably around 3-5 days unless he has complications. I discussed epidural for pain control and lifting restrictions post op.  He is retired, but lives alone. I advised that he would need someone to assist him at a minimum for several days when he goes home. Current Plans You are being scheduled for surgery- Our schedulers will call you.  You should hear from our office's scheduling department within 5 working days about the location, date, and time of surgery. We try to make accommodations for patient's preferences in scheduling surgery, but sometimes the OR schedule or the surgeon's schedule prevents [place] from making those accommodations.  If you have not heard from our office [phone]) in 5 working days, call the office and ask for your surgeon's nurse.  If you have other questions about your diagnosis, plan, or surgery, call the office and ask for your surgeon's nurse.  LIVER MASS, RIGHT LOBE (R16.0) Impression: see above. CHOLELITHIASIS (K80.20) Impression: Given extent of surgery, will likely go ahead and take gallbladder as it will be difficult to access later

## 2020-02-25 ENCOUNTER — Other Ambulatory Visit: Payer: Self-pay | Admitting: Internal Medicine

## 2020-02-25 ENCOUNTER — Encounter (HOSPITAL_COMMUNITY): Payer: Self-pay | Admitting: General Surgery

## 2020-02-25 NOTE — Anesthesia Preprocedure Evaluation (Addendum)
Anesthesia Evaluation  Patient identified by MRN, date of birth, ID band Patient awake    Reviewed: Allergy & Precautions, H&P , NPO status , Patient's Chart, lab work & pertinent test results  Airway Mallampati: I  TM Distance: >3 FB Neck ROM: Full    Dental no notable dental hx. (+) Teeth Intact, Dental Advisory Given   Pulmonary neg pulmonary ROS, sleep apnea ,    Pulmonary exam normal breath sounds clear to auscultation       Cardiovascular hypertension, Pt. on medications Normal cardiovascular exam Rhythm:Regular Rate:Normal     Neuro/Psych PSYCHIATRIC DISORDERS Anxiety Depression negative neurological ROS  negative psych ROS   GI/Hepatic Neg liver ROS, GERD  Medicated,  Endo/Other  negative endocrine ROSdiabetes, Type 2Morbid obesity  Renal/GU CRFRenal disease  negative genitourinary   Musculoskeletal  (+) Arthritis , Osteoarthritis,    Abdominal   Peds negative pediatric ROS (+)  Hematology  (+) Blood dyscrasia, anemia ,   Anesthesia Other Findings   Reproductive/Obstetrics negative OB ROS                            Anesthesia Physical  Anesthesia Plan  ASA: III  Anesthesia Plan: General and Epidural   Post-op Pain Management: GA combined w/ Regional for post-op pain   Induction: Intravenous  PONV Risk Score and Plan: 1 and Ondansetron, Propofol infusion and Treatment may vary due to age or medical condition  Airway Management Planned: Oral ETT  Additional Equipment: Arterial line  Intra-op Plan:   Post-operative Plan: Extubation in OR and Possible Post-op intubation/ventilation  Informed Consent: I have reviewed the patients History and Physical, chart, labs and discussed the procedure including the risks, benefits and alternatives for the proposed anesthesia with the patient or authorized representative who has indicated his/her understanding and acceptance.        Plan Discussed with: CRNA, Anesthesiologist and Surgeon  Anesthesia Plan Comments: (  )       Anesthesia Quick Evaluation

## 2020-02-26 ENCOUNTER — Encounter (HOSPITAL_COMMUNITY): Payer: Self-pay | Admitting: General Surgery

## 2020-02-26 ENCOUNTER — Inpatient Hospital Stay (HOSPITAL_COMMUNITY): Payer: Medicare Other | Admitting: Certified Registered Nurse Anesthetist

## 2020-02-26 ENCOUNTER — Inpatient Hospital Stay (HOSPITAL_COMMUNITY): Payer: Medicare Other | Admitting: Physician Assistant

## 2020-02-26 ENCOUNTER — Inpatient Hospital Stay (HOSPITAL_COMMUNITY)
Admission: RE | Admit: 2020-02-26 | Discharge: 2020-03-03 | DRG: 614 | Disposition: A | Discharge status: Home Health | Payer: Medicare Other | Attending: Urology | Admitting: Urology

## 2020-02-26 ENCOUNTER — Encounter (HOSPITAL_COMMUNITY)
Admission: RE | Disposition: A | Discharge status: Home Health | Payer: Self-pay | Source: Home / Self Care | Attending: Urology

## 2020-02-26 DIAGNOSIS — G4730 Sleep apnea, unspecified: Secondary | ICD-10-CM | POA: Diagnosis present

## 2020-02-26 DIAGNOSIS — E1122 Type 2 diabetes mellitus with diabetic chronic kidney disease: Secondary | ICD-10-CM | POA: Diagnosis present

## 2020-02-26 DIAGNOSIS — E8770 Fluid overload, unspecified: Secondary | ICD-10-CM | POA: Diagnosis not present

## 2020-02-26 DIAGNOSIS — Z905 Acquired absence of kidney: Secondary | ICD-10-CM

## 2020-02-26 DIAGNOSIS — Z85528 Personal history of other malignant neoplasm of kidney: Secondary | ICD-10-CM | POA: Diagnosis not present

## 2020-02-26 DIAGNOSIS — G8918 Other acute postprocedural pain: Secondary | ICD-10-CM | POA: Diagnosis not present

## 2020-02-26 DIAGNOSIS — N183 Chronic kidney disease, stage 3 unspecified: Secondary | ICD-10-CM | POA: Diagnosis present

## 2020-02-26 DIAGNOSIS — K660 Peritoneal adhesions (postprocedural) (postinfection): Secondary | ICD-10-CM | POA: Diagnosis present

## 2020-02-26 DIAGNOSIS — C7971 Secondary malignant neoplasm of right adrenal gland: Secondary | ICD-10-CM | POA: Diagnosis present

## 2020-02-26 DIAGNOSIS — R509 Fever, unspecified: Principal | ICD-10-CM

## 2020-02-26 DIAGNOSIS — Z8546 Personal history of malignant neoplasm of prostate: Secondary | ICD-10-CM

## 2020-02-26 DIAGNOSIS — C786 Secondary malignant neoplasm of retroperitoneum and peritoneum: Secondary | ICD-10-CM | POA: Diagnosis present

## 2020-02-26 DIAGNOSIS — N179 Acute kidney failure, unspecified: Secondary | ICD-10-CM | POA: Diagnosis not present

## 2020-02-26 DIAGNOSIS — R0602 Shortness of breath: Secondary | ICD-10-CM | POA: Diagnosis not present

## 2020-02-26 DIAGNOSIS — I517 Cardiomegaly: Secondary | ICD-10-CM | POA: Diagnosis not present

## 2020-02-26 DIAGNOSIS — D62 Acute posthemorrhagic anemia: Secondary | ICD-10-CM | POA: Diagnosis not present

## 2020-02-26 DIAGNOSIS — Z20822 Contact with and (suspected) exposure to covid-19: Secondary | ICD-10-CM | POA: Diagnosis present

## 2020-02-26 DIAGNOSIS — C787 Secondary malignant neoplasm of liver and intrahepatic bile duct: Secondary | ICD-10-CM | POA: Diagnosis not present

## 2020-02-26 DIAGNOSIS — C649 Malignant neoplasm of unspecified kidney, except renal pelvis: Secondary | ICD-10-CM | POA: Diagnosis present

## 2020-02-26 DIAGNOSIS — K8020 Calculus of gallbladder without cholecystitis without obstruction: Secondary | ICD-10-CM | POA: Diagnosis not present

## 2020-02-26 DIAGNOSIS — C641 Malignant neoplasm of right kidney, except renal pelvis: Secondary | ICD-10-CM | POA: Diagnosis not present

## 2020-02-26 DIAGNOSIS — C7900 Secondary malignant neoplasm of unspecified kidney and renal pelvis: Secondary | ICD-10-CM | POA: Diagnosis not present

## 2020-02-26 DIAGNOSIS — K8010 Calculus of gallbladder with chronic cholecystitis without obstruction: Secondary | ICD-10-CM | POA: Diagnosis not present

## 2020-02-26 DIAGNOSIS — J9811 Atelectasis: Secondary | ICD-10-CM | POA: Diagnosis not present

## 2020-02-26 DIAGNOSIS — R21 Rash and other nonspecific skin eruption: Secondary | ICD-10-CM | POA: Diagnosis not present

## 2020-02-26 HISTORY — PX: ADRENALECTOMY: SHX876

## 2020-02-26 HISTORY — PX: OPEN PARTIAL HEPATECTOMY [83]: SHX5987

## 2020-02-26 LAB — GLUCOSE, CAPILLARY
Glucose-Capillary: 131 mg/dL — ABNORMAL HIGH (ref 70–99)
Glucose-Capillary: 170 mg/dL — ABNORMAL HIGH (ref 70–99)
Glucose-Capillary: 121 mg/dL — ABNORMAL HIGH (ref 70–99)
Glucose-Capillary: 108 mg/dL — ABNORMAL HIGH (ref 70–99)

## 2020-02-26 LAB — CBC
HCT: 24.1 % — ABNORMAL LOW (ref 39.0–52.0)
Hemoglobin: 8.4 g/dL — ABNORMAL LOW (ref 13.0–17.0)
MCH: 30.8 pg (ref 26.0–34.0)
MCHC: 34.9 g/dL (ref 30.0–36.0)
MCV: 88.3 fL (ref 80.0–100.0)
Platelets: 95 10*3/uL — ABNORMAL LOW (ref 150–400)
RBC: 2.73 MIL/uL — ABNORMAL LOW (ref 4.22–5.81)
RDW: 14.7 % (ref 11.5–15.5)
WBC: 3 10*3/uL — ABNORMAL LOW (ref 4.0–10.5)
nRBC: 0 % (ref 0.0–0.2)

## 2020-02-26 LAB — POCT I-STAT 7, (LYTES, BLD GAS, ICA,H+H)
Acid-base deficit: 3 mmol/L — ABNORMAL HIGH (ref 0.0–2.0)
Acid-base deficit: 7 mmol/L — ABNORMAL HIGH (ref 0.0–2.0)
Bicarbonate: 23.6 mmol/L (ref 20.0–28.0)
Bicarbonate: 20.4 mmol/L (ref 20.0–28.0)
Calcium, Ion: 1.32 mmol/L (ref 1.15–1.40)
Calcium, Ion: 1.36 mmol/L (ref 1.15–1.40)
HCT: 26 % — ABNORMAL LOW (ref 39.0–52.0)
HCT: 25 % — ABNORMAL LOW (ref 39.0–52.0)
Hemoglobin: 8.8 g/dL — ABNORMAL LOW (ref 13.0–17.0)
Hemoglobin: 8.5 g/dL — ABNORMAL LOW (ref 13.0–17.0)
O2 Saturation: 99 %
O2 Saturation: 100 %
Potassium: 4.9 mmol/L (ref 3.5–5.1)
Potassium: 5.6 mmol/L — ABNORMAL HIGH (ref 3.5–5.1)
Sodium: 135 mmol/L (ref 135–145)
Sodium: 137 mmol/L (ref 135–145)
TCO2: 25 mmol/L (ref 22–32)
TCO2: 22 mmol/L (ref 22–32)
pCO2 arterial: 47.4 mmHg (ref 32.0–48.0)
pCO2 arterial: 48.7 mmHg — ABNORMAL HIGH (ref 32.0–48.0)
pH, Arterial: 7.305 — ABNORMAL LOW (ref 7.350–7.450)
pH, Arterial: 7.231 — ABNORMAL LOW (ref 7.350–7.450)
pO2, Arterial: 157 mmHg — ABNORMAL HIGH (ref 83.0–108.0)
pO2, Arterial: 227 mmHg — ABNORMAL HIGH (ref 83.0–108.0)

## 2020-02-26 LAB — BASIC METABOLIC PANEL
Anion gap: 9 (ref 5–15)
BUN: 23 mg/dL (ref 8–23)
CO2: 21 mmol/L — ABNORMAL LOW (ref 22–32)
Calcium: 9.1 mg/dL (ref 8.9–10.3)
Chloride: 109 mmol/L (ref 98–111)
Creatinine, Ser: 2.09 mg/dL — ABNORMAL HIGH (ref 0.61–1.24)
GFR, Estimated: 30 mL/min — ABNORMAL LOW (ref >60)
Glucose, Bld: 193 mg/dL — ABNORMAL HIGH (ref 70–99)
Potassium: 5.5 mmol/L — ABNORMAL HIGH (ref 3.5–5.1)
Sodium: 139 mmol/L (ref 135–145)

## 2020-02-26 LAB — MRSA PCR SCREENING: MRSA by PCR: NEGATIVE

## 2020-02-26 LAB — PROTIME-INR
INR: 1.3 — ABNORMAL HIGH (ref 0.8–1.2)
Prothrombin Time: 15.8 seconds — ABNORMAL HIGH (ref 11.4–15.2)

## 2020-02-26 LAB — PREPARE RBC (CROSSMATCH)

## 2020-02-26 SURGERY — HEPATECTOMY, PARTIAL, OPEN
Anesthesia: Epidural | Laterality: Right

## 2020-02-26 MED ORDER — MIDAZOLAM HCL 2 MG/2ML IJ SOLN
INTRAMUSCULAR | Status: DC | PRN
  Administered 2020-02-26 (×2): 1 mg via INTRAVENOUS

## 2020-02-26 MED ORDER — MICROFIBRILLAR COLL HEMOSTAT EX POWD
1.0000 g | Freq: Once | CUTANEOUS | Status: DC
  Filled 2020-02-26: qty 5
  Filled 2020-02-26: qty 1

## 2020-02-26 MED ORDER — PHENYLEPHRINE 40 MCG/ML (10ML) SYRINGE FOR IV PUSH (FOR BLOOD PRESSURE SUPPORT)
PREFILLED_SYRINGE | INTRAVENOUS | Status: AC
  Filled 2020-02-26: qty 20

## 2020-02-26 MED ORDER — ACETAMINOPHEN 500 MG PO TABS
1000.0000 mg | ORAL_TABLET | ORAL | Status: AC
  Administered 2020-02-26: 1000 mg via ORAL
  Filled 2020-02-26: qty 2

## 2020-02-26 MED ORDER — THROMBIN 20000 UNITS EX KIT
20000.0000 [IU] | PACK | Freq: Once | CUTANEOUS | Status: DC
  Filled 2020-02-26: qty 1

## 2020-02-26 MED ORDER — ORAL CARE MOUTH RINSE: 15.0000 mL | Freq: Once | OROMUCOSAL | Status: AC

## 2020-02-26 MED ORDER — VISTASEAL 10 ML SINGLE DOSE KIT
10.0000 mL | PACK | Freq: Once | CUTANEOUS | Status: DC
  Filled 2020-02-26: qty 10

## 2020-02-26 MED ORDER — CHLORHEXIDINE GLUCONATE CLOTH 2 % EX PADS: 6.0000 | MEDICATED_PAD | Freq: Once | CUTANEOUS | Status: DC

## 2020-02-26 MED ORDER — FENTANYL CITRATE (PF) 250 MCG/5ML IJ SOLN
INTRAMUSCULAR | Status: DC | PRN
Start: 2020-02-26 — End: 2020-02-26
  Administered 2020-02-26 (×2): 50 ug via INTRAVENOUS

## 2020-02-26 MED ORDER — PROPOFOL 10 MG/ML IV BOLUS
INTRAVENOUS | Status: AC
  Filled 2020-02-26: qty 20

## 2020-02-26 MED ORDER — ROPIVACAINE HCL 2 MG/ML IJ SOLN: 10.0000 mL/h | INTRAMUSCULAR | Status: DC

## 2020-02-26 MED ORDER — ALBUMIN HUMAN 5 % IV SOLN
INTRAVENOUS | Status: AC
  Filled 2020-02-26: qty 250

## 2020-02-26 MED ORDER — CHLORHEXIDINE GLUCONATE CLOTH 2 % EX PADS
6.0000 | MEDICATED_PAD | Freq: Every day | CUTANEOUS | Status: DC
  Administered 2020-02-26 – 2020-03-03 (×7): 6 via TOPICAL

## 2020-02-26 MED ORDER — CEFAZOLIN SODIUM-DEXTROSE 2-4 GM/100ML-% IV SOLN
INTRAVENOUS | Status: AC
  Filled 2020-02-26: qty 100

## 2020-02-26 MED ORDER — LACTATED RINGERS IV SOLN: INTRAVENOUS | Status: DC | PRN

## 2020-02-26 MED ORDER — TISSEEL VH 10 ML EX KIT
PACK | Freq: Once | CUTANEOUS | Status: DC
  Filled 2020-02-26: qty 1

## 2020-02-26 MED ORDER — ORAL CARE MOUTH RINSE
15.0000 mL | Freq: Two times a day (BID) | OROMUCOSAL | Status: DC
  Administered 2020-02-26 – 2020-03-02 (×11): 15 mL via OROMUCOSAL

## 2020-02-26 MED ORDER — VASOPRESSIN 20 UNIT/ML IV SOLN
INTRAVENOUS | Status: DC | PRN
  Administered 2020-02-26: 1 [IU] via INTRAVENOUS
  Administered 2020-02-26: 2 [IU] via INTRAVENOUS
  Administered 2020-02-26 (×7): 1 [IU] via INTRAVENOUS

## 2020-02-26 MED ORDER — DEXAMETHASONE SODIUM PHOSPHATE 10 MG/ML IJ SOLN
INTRAMUSCULAR | Status: AC
  Filled 2020-02-26: qty 1

## 2020-02-26 MED ORDER — PHENYLEPHRINE HCL (PRESSORS) 10 MG/ML IV SOLN
INTRAVENOUS | Status: AC
  Filled 2020-02-26: qty 1

## 2020-02-26 MED ORDER — DOCUSATE SODIUM 100 MG PO CAPS
100.0000 mg | ORAL_CAPSULE | Freq: Two times a day (BID) | ORAL | Status: DC
  Administered 2020-02-27 – 2020-03-03 (×8): 100 mg via ORAL
  Filled 2020-02-26 (×8): qty 1

## 2020-02-26 MED ORDER — PHENYLEPHRINE HCL-NACL 10-0.9 MG/250ML-% IV SOLN: 0.0000 ug/min | INTRAVENOUS | Status: DC

## 2020-02-26 MED ORDER — ALBUMIN HUMAN 5 % IV SOLN
12.5000 g | Freq: Once | INTRAVENOUS | Status: AC
  Administered 2020-02-26: 12.5 g via INTRAVENOUS

## 2020-02-26 MED ORDER — ROPIVACAINE HCL 2 MG/ML IJ SOLN
0.0000 mL/h | INTRAMUSCULAR | Status: DC
  Administered 2020-02-26: 8 mL/h via EPIDURAL
  Administered 2020-02-26: 7 mL via EPIDURAL
  Administered 2020-02-27: 6 mL/h via EPIDURAL
  Administered 2020-02-28: 7 mL/h via EPIDURAL
  Filled 2020-02-26 (×7): qty 200

## 2020-02-26 MED ORDER — FENTANYL CITRATE (PF) 100 MCG/2ML IJ SOLN
INTRAMUSCULAR | Status: AC
  Filled 2020-02-26: qty 2

## 2020-02-26 MED ORDER — ONDANSETRON HCL 4 MG/2ML IJ SOLN
INTRAMUSCULAR | Status: AC
  Filled 2020-02-26: qty 2

## 2020-02-26 MED ORDER — DEXAMETHASONE SODIUM PHOSPHATE 10 MG/ML IJ SOLN
INTRAMUSCULAR | Status: DC | PRN
  Administered 2020-02-26: 5 mg via INTRAVENOUS

## 2020-02-26 MED ORDER — 0.9 % SODIUM CHLORIDE (POUR BTL) OPTIME
TOPICAL | Status: DC | PRN
  Administered 2020-02-26: 2000 mL

## 2020-02-26 MED ORDER — BUPIVACAINE LIPOSOME 1.3 % IJ SUSP
20.0000 mL | Freq: Once | INTRAMUSCULAR | Status: DC
  Filled 2020-02-26: qty 20

## 2020-02-26 MED ORDER — TAMSULOSIN HCL 0.4 MG PO CAPS
0.4000 mg | ORAL_CAPSULE | Freq: Every day | ORAL | Status: DC
  Administered 2020-02-28 – 2020-03-03 (×5): 0.4 mg via ORAL
  Filled 2020-02-26 (×6): qty 1

## 2020-02-26 MED ORDER — DILTIAZEM HCL 60 MG PO TABS
120.0000 mg | ORAL_TABLET | Freq: Four times a day (QID) | ORAL | Status: DC
  Filled 2020-02-26: qty 2

## 2020-02-26 MED ORDER — EVICEL 5 ML EX KIT
PACK | Freq: Once | CUTANEOUS | Status: DC
  Filled 2020-02-26: qty 1

## 2020-02-26 MED ORDER — VASOPRESSIN 20 UNIT/ML IV SOLN
INTRAVENOUS | Status: AC
  Filled 2020-02-26: qty 1

## 2020-02-26 MED ORDER — SUGAMMADEX SODIUM 200 MG/2ML IV SOLN
INTRAVENOUS | Status: DC | PRN
  Administered 2020-02-26: 200 mg via INTRAVENOUS

## 2020-02-26 MED ORDER — ROCURONIUM BROMIDE 10 MG/ML (PF) SYRINGE
PREFILLED_SYRINGE | INTRAVENOUS | Status: DC | PRN
  Administered 2020-02-26: 80 mg via INTRAVENOUS
  Administered 2020-02-26: 20 mg via INTRAVENOUS
  Administered 2020-02-26: 30 mg via INTRAVENOUS

## 2020-02-26 MED ORDER — PHENYLEPHRINE HCL-NACL 10-0.9 MG/250ML-% IV SOLN
0.0000 ug/min | INTRAVENOUS | Status: DC
  Administered 2020-02-26 – 2020-02-27 (×3): 30 ug/min via INTRAVENOUS
  Filled 2020-02-26 (×3): qty 250

## 2020-02-26 MED ORDER — ALBUMIN HUMAN 5 % IV SOLN
INTRAVENOUS | Status: AC
  Filled 2020-02-26: qty 500

## 2020-02-26 MED ORDER — PHENYLEPHRINE HCL-NACL 10-0.9 MG/250ML-% IV SOLN
INTRAVENOUS | Status: DC | PRN
  Administered 2020-02-26: 50 ug/min via INTRAVENOUS

## 2020-02-26 MED ORDER — SODIUM CHLORIDE 0.9 % IV SOLN: 10.0000 mL/h | Freq: Once | INTRAVENOUS | Status: DC

## 2020-02-26 MED ORDER — OXYCODONE HCL 5 MG PO TABS: 5.0000 mg | ORAL_TABLET | Freq: Once | ORAL | Status: DC | PRN

## 2020-02-26 MED ORDER — OXYCODONE HCL 5 MG/5ML PO SOLN: 5.0000 mg | Freq: Once | ORAL | Status: DC | PRN

## 2020-02-26 MED ORDER — ACETAMINOPHEN 10 MG/ML IV SOLN
1000.0000 mg | Freq: Four times a day (QID) | INTRAVENOUS | Status: AC
  Administered 2020-02-26 – 2020-02-27 (×4): 1000 mg via INTRAVENOUS
  Filled 2020-02-26 (×4): qty 100

## 2020-02-26 MED ORDER — PROPOFOL 10 MG/ML IV BOLUS
INTRAVENOUS | Status: DC | PRN
  Administered 2020-02-26: 150 mg via INTRAVENOUS

## 2020-02-26 MED ORDER — ONDANSETRON HCL 4 MG/2ML IJ SOLN
INTRAMUSCULAR | Status: DC | PRN
  Administered 2020-02-26: 4 mg via INTRAVENOUS

## 2020-02-26 MED ORDER — SODIUM CHLORIDE 0.9 % IV SOLN
500.0000 mg | Freq: Once | INTRAVENOUS | Status: DC
  Filled 2020-02-26 (×2): qty 0.5

## 2020-02-26 MED ORDER — CHLORHEXIDINE GLUCONATE 0.12 % MT SOLN
15.0000 mL | Freq: Once | OROMUCOSAL | Status: AC
  Administered 2020-02-26: 15 mL via OROMUCOSAL

## 2020-02-26 MED ORDER — MIDAZOLAM HCL 2 MG/2ML IJ SOLN
INTRAMUSCULAR | Status: AC
  Filled 2020-02-26: qty 2

## 2020-02-26 MED ORDER — PHENYLEPHRINE 40 MCG/ML (10ML) SYRINGE FOR IV PUSH (FOR BLOOD PRESSURE SUPPORT)
PREFILLED_SYRINGE | INTRAVENOUS | Status: DC | PRN
  Administered 2020-02-26: 80 ug via INTRAVENOUS
  Administered 2020-02-26 (×2): 120 ug via INTRAVENOUS
  Administered 2020-02-26: 40 ug via INTRAVENOUS
  Administered 2020-02-26 (×3): 80 ug via INTRAVENOUS

## 2020-02-26 MED ORDER — ALBUMIN HUMAN 5 % IV SOLN: INTRAVENOUS | Status: DC | PRN

## 2020-02-26 MED ORDER — LACTATED RINGERS IV SOLN: INTRAVENOUS | Status: DC

## 2020-02-26 MED ORDER — LIDOCAINE HCL (CARDIAC) PF 100 MG/5ML IV SOSY
PREFILLED_SYRINGE | INTRAVENOUS | Status: DC | PRN
  Administered 2020-02-26: 80 mg via INTRAVENOUS

## 2020-02-26 MED ORDER — SODIUM CHLORIDE 0.9 % IV SOLN: 250.0000 mL | INTRAVENOUS | Status: DC

## 2020-02-26 MED ORDER — CEFAZOLIN SODIUM-DEXTROSE 2-4 GM/100ML-% IV SOLN
2.0000 g | INTRAVENOUS | Status: AC
  Administered 2020-02-26 (×2): 2 g via INTRAVENOUS
  Filled 2020-02-26: qty 100

## 2020-02-26 MED ORDER — LIDOCAINE HCL (PF) 1 % IJ SOLN
INTRAMUSCULAR | Status: AC
  Filled 2020-02-26: qty 30

## 2020-02-26 MED ORDER — PANTOPRAZOLE SODIUM 40 MG IV SOLR
40.0000 mg | INTRAVENOUS | Status: DC
  Administered 2020-02-26 – 2020-02-27 (×2): 40 mg via INTRAVENOUS
  Filled 2020-02-26 (×2): qty 40

## 2020-02-26 MED ORDER — ONDANSETRON HCL 4 MG/2ML IJ SOLN: 4.0000 mg | Freq: Four times a day (QID) | INTRAMUSCULAR | Status: DC | PRN

## 2020-02-26 MED ORDER — BUPIVACAINE-EPINEPHRINE (PF) 0.5% -1:200000 IJ SOLN
INTRAMUSCULAR | Status: AC
  Filled 2020-02-26: qty 30

## 2020-02-26 MED ORDER — CALCIUM CHLORIDE 10 % IV SOLN
INTRAVENOUS | Status: DC | PRN
  Administered 2020-02-26 (×2): .5 g via INTRAVENOUS

## 2020-02-26 MED ORDER — MEPERIDINE HCL 50 MG/ML IJ SOLN: 6.2500 mg | INTRAMUSCULAR | Status: DC | PRN

## 2020-02-26 MED ORDER — FENTANYL CITRATE (PF) 100 MCG/2ML IJ SOLN: 25.0000 ug | INTRAMUSCULAR | Status: DC | PRN

## 2020-02-26 MED ORDER — ACETAMINOPHEN 325 MG PO TABS: 325.0000 mg | ORAL_TABLET | ORAL | Status: DC | PRN

## 2020-02-26 MED ORDER — LIDOCAINE 2% (20 MG/ML) 5 ML SYRINGE
INTRAMUSCULAR | Status: AC
  Filled 2020-02-26: qty 5

## 2020-02-26 MED ORDER — VASOPRESSIN 20 UNITS/100 ML INFUSION FOR SHOCK
0.0000 [IU]/min | INTRAVENOUS | Status: DC
  Filled 2020-02-26: qty 100

## 2020-02-26 MED ORDER — SODIUM CHLORIDE (PF) 0.9 % IJ SOLN
INTRAMUSCULAR | Status: AC
  Filled 2020-02-26: qty 20

## 2020-02-26 MED ORDER — HYDROMORPHONE HCL 1 MG/ML IJ SOLN
0.5000 mg | INTRAMUSCULAR | Status: DC | PRN
  Administered 2020-02-26 – 2020-03-02 (×10): 0.5 mg via INTRAVENOUS
  Filled 2020-02-26 (×7): qty 1
  Filled 2020-02-26: qty 0.5
  Filled 2020-02-26 (×2): qty 1

## 2020-02-26 MED ORDER — INSULIN ASPART 100 UNIT/ML ~~LOC~~ SOLN
0.0000 [IU] | SUBCUTANEOUS | Status: DC
  Administered 2020-02-26 – 2020-02-28 (×3): 2 [IU] via SUBCUTANEOUS
  Administered 2020-02-28: 3 [IU] via SUBCUTANEOUS
  Administered 2020-02-28: 2 [IU] via SUBCUTANEOUS
  Administered 2020-02-29: 5 [IU] via SUBCUTANEOUS
  Administered 2020-02-29: 2 [IU] via SUBCUTANEOUS
  Administered 2020-02-29: 5 [IU] via SUBCUTANEOUS
  Administered 2020-03-01: 20:00:00 2 [IU] via SUBCUTANEOUS
  Administered 2020-03-01: 3 [IU] via SUBCUTANEOUS
  Administered 2020-03-01: 2 [IU] via SUBCUTANEOUS
  Administered 2020-03-01: 3 [IU] via SUBCUTANEOUS
  Administered 2020-03-01: 2 [IU] via SUBCUTANEOUS
  Administered 2020-03-02: 08:00:00 3 [IU] via SUBCUTANEOUS
  Administered 2020-03-02 (×2): 2 [IU] via SUBCUTANEOUS
  Administered 2020-03-02: 5 [IU] via SUBCUTANEOUS
  Administered 2020-03-03: 3 [IU] via SUBCUTANEOUS
  Administered 2020-03-03 (×2): 2 [IU] via SUBCUTANEOUS

## 2020-02-26 MED ORDER — CEFAZOLIN SODIUM-DEXTROSE 2-4 GM/100ML-% IV SOLN: 2.0000 g | INTRAVENOUS | Status: DC

## 2020-02-26 MED ORDER — ROCURONIUM BROMIDE 10 MG/ML (PF) SYRINGE
PREFILLED_SYRINGE | INTRAVENOUS | Status: AC
  Filled 2020-02-26: qty 10

## 2020-02-26 MED ORDER — ONDANSETRON HCL 4 MG/2ML IJ SOLN: 4.0000 mg | Freq: Once | INTRAMUSCULAR | Status: DC | PRN

## 2020-02-26 MED ORDER — PHENYLEPHRINE HCL-NACL 10-0.9 MG/250ML-% IV SOLN
INTRAVENOUS | Status: AC
  Filled 2020-02-26: qty 250

## 2020-02-26 MED ORDER — ACETAMINOPHEN 160 MG/5ML PO SOLN: 325.0000 mg | ORAL | Status: DC | PRN

## 2020-02-26 MED ORDER — SODIUM CHLORIDE 0.9 % IV SOLN: INTRAVENOUS | Status: DC | PRN

## 2020-02-26 MED ORDER — HEMOSTATIC AGENTS (NO CHARGE) OPTIME
TOPICAL | Status: DC | PRN
  Administered 2020-02-26: 1 via TOPICAL

## 2020-02-26 SURGICAL SUPPLY — 128 items
ADH SKN CLS APL DERMABOND .7 (GAUZE/BANDAGES/DRESSINGS) ×2
AGENT HMST KT MTR STRL THRMB (HEMOSTASIS) ×2
APL PRP STRL LF DISP 70% ISPRP (MISCELLANEOUS) ×2
APPLIER CLIP 13 LRG OPEN (CLIP) ×4
APPLIER CLIP 5 13 M/L LIGAMAX5 (MISCELLANEOUS)
APPLIER CLIP ROT 10 11.4 M/L (STAPLE)
APR CLP LRG 13 20 CLIP (CLIP) ×2
APR CLP MED LRG 11.4X10 (STAPLE)
APR CLP MED LRG 5 ANG JAW (MISCELLANEOUS)
ATTRACTOMAT 16X20 MAGNETIC DRP (DRAPES) ×2 IMPLANT
BAG BILE T-TUBES STRL (MISCELLANEOUS) ×2 IMPLANT
BAG DRN 9.5 2 ADJ BELT ADPR (MISCELLANEOUS) ×2
BLADE EXTENDED COATED 6.5IN (ELECTRODE) ×6 IMPLANT
BRR ADH 5X3 SEPRAFILM 6 SHT (MISCELLANEOUS)
CATH ROBINSON RED A/P 16FR (CATHETERS) IMPLANT
CHLORAPREP W/TINT 26 (MISCELLANEOUS) ×6 IMPLANT
CLIP APPLIE 13 LRG OPEN (CLIP) IMPLANT
CLIP APPLIE 5 13 M/L LIGAMAX5 (MISCELLANEOUS) IMPLANT
CLIP APPLIE ROT 10 11.4 M/L (STAPLE) IMPLANT
CLIP VESOCCLUDE LG 6/CT (CLIP) ×8 IMPLANT
CLIP VESOCCLUDE MED 6/CT (CLIP) ×8 IMPLANT
CLIP VESOLOCK LG 6/CT PURPLE (CLIP) ×4 IMPLANT
CLIP VESOLOCK MED 6/CT (CLIP) ×4 IMPLANT
CLIP VESOLOCK MED LG 6/CT (CLIP) ×4 IMPLANT
COVER SURGICAL LIGHT HANDLE (MISCELLANEOUS) ×4 IMPLANT
COVER WAND RF STERILE (DRAPES) IMPLANT
DECANTER SPIKE VIAL GLASS SM (MISCELLANEOUS) ×2 IMPLANT
DERMABOND ADVANCED (GAUZE/BANDAGES/DRESSINGS) ×2
DERMABOND ADVANCED .7 DNX12 (GAUZE/BANDAGES/DRESSINGS) IMPLANT
DISSECTOR ROUND CHERRY 3/8 STR (MISCELLANEOUS) ×2 IMPLANT
DRAIN CHANNEL 15F RND FF 3/16 (WOUND CARE) IMPLANT
DRAIN CHANNEL 19F RND (DRAIN) ×2 IMPLANT
DRAPE INCISE IOBAN 66X45 STRL (DRAPES) ×2 IMPLANT
DRAPE LAPAROTOMY TRNSV 102X78 (DRAPES) ×2 IMPLANT
DRAPE SHEET LG 3/4 BI-LAMINATE (DRAPES) IMPLANT
DRAPE WARM FLUID 44X44 (DRAPES) ×6 IMPLANT
DRSG OPSITE POSTOP 4X12 (GAUZE/BANDAGES/DRESSINGS) ×2 IMPLANT
DRSG TEGADERM 4X4.75 (GAUZE/BANDAGES/DRESSINGS) ×2 IMPLANT
DRSG TELFA 3X8 NADH (GAUZE/BANDAGES/DRESSINGS) IMPLANT
DRSG TELFA 4X10 ISLAND STR (GAUZE/BANDAGES/DRESSINGS) IMPLANT
DRSG TELFA 4X8 ISLAND (GAUZE/BANDAGES/DRESSINGS) IMPLANT
DRSG TELFA PLUS 4X6 ADH ISLAND (GAUZE/BANDAGES/DRESSINGS) IMPLANT
ELECT REM PT RETURN 15FT ADLT (MISCELLANEOUS) ×6 IMPLANT
EVACUATOR SILICONE 100CC (DRAIN) ×2 IMPLANT
GAUZE 4X4 16PLY RFD (DISPOSABLE) ×2 IMPLANT
GAUZE SPONGE 2X2 8PLY STRL LF (GAUZE/BANDAGES/DRESSINGS) IMPLANT
GAUZE SPONGE 4X4 12PLY STRL (GAUZE/BANDAGES/DRESSINGS) IMPLANT
GLOVE BIO SURGEON STRL SZ 6 (GLOVE) ×8 IMPLANT
GLOVE BIOGEL M STRL SZ7.5 (GLOVE) ×4 IMPLANT
GLOVE INDICATOR 6.5 STRL GRN (GLOVE) ×6 IMPLANT
GOWN STRL REUS W/TWL 2XL LVL3 (GOWN DISPOSABLE) ×6 IMPLANT
GOWN STRL REUS W/TWL LRG LVL3 (GOWN DISPOSABLE) ×8 IMPLANT
GOWN STRL REUS W/TWL XL LVL3 (GOWN DISPOSABLE) ×4 IMPLANT
HANDLE SUCTION POOLE (INSTRUMENTS) ×2 IMPLANT
HEMOSTAT SURGICEL 4X8 (HEMOSTASIS) ×2 IMPLANT
KIT BASIN OR (CUSTOM PROCEDURE TRAY) ×6 IMPLANT
KIT TURNOVER KIT A (KITS) IMPLANT
LOOP VESSEL MAXI BLUE (MISCELLANEOUS) ×6 IMPLANT
LOOP VESSEL MINI RED (MISCELLANEOUS) ×2 IMPLANT
NDL BIOPSY 14GX4.5 SOFT TIS (NEEDLE) IMPLANT
NEEDLE BIOPSY 14GX4.5 SOFT TIS (NEEDLE) IMPLANT
NEEDLE HYPO 22GX1.5 SAFETY (NEEDLE) ×4 IMPLANT
NS IRRIG 1000ML POUR BTL (IV SOLUTION) ×8 IMPLANT
PACK GENERAL/GYN (CUSTOM PROCEDURE TRAY) ×6 IMPLANT
PACK UNIVERSAL I (CUSTOM PROCEDURE TRAY) ×4 IMPLANT
PAD DRESSING TELFA 3X8 NADH (GAUZE/BANDAGES/DRESSINGS) IMPLANT
PENCIL SMOKE EVACUATOR (MISCELLANEOUS) IMPLANT
PLUG CATH AND CAP STER (CATHETERS) IMPLANT
PROTECTOR NERVE ULNAR (MISCELLANEOUS) ×4 IMPLANT
RELOAD STAPLE 60 2.6 WHT THN (STAPLE) ×2 IMPLANT
RELOAD STAPLER WHITE 60MM (STAPLE) IMPLANT
SEPRAFILM PROCEDURAL PACK 3X5 (MISCELLANEOUS) IMPLANT
SET TUBE SMOKE EVAC HIGH FLOW (TUBING) IMPLANT
SHEARS FOC LG CVD HARMONIC 17C (MISCELLANEOUS) ×2 IMPLANT
SHEARS HARMONIC STRL 23CM (MISCELLANEOUS) ×2 IMPLANT
SLEEVE SURGEON STRL (DRAPES) IMPLANT
SPONGE GAUZE 2X2 STER 10/PKG (GAUZE/BANDAGES/DRESSINGS) ×2
SPONGE LAP 18X18 RF (DISPOSABLE) ×12 IMPLANT
SPONGE SURGIFOAM ABS GEL 100 (HEMOSTASIS) IMPLANT
STAPLE ECHEON FLEX 60 POW ENDO (STAPLE) IMPLANT
STAPLER RELOAD WHITE 60MM (STAPLE)
STAPLER VISISTAT 35W (STAPLE) ×2 IMPLANT
SUCTION POOLE HANDLE (INSTRUMENTS) ×4
SURGIFLO W/THROMBIN 8M KIT (HEMOSTASIS) ×2 IMPLANT
SUT CHROMIC 0 BP (SUTURE) ×8 IMPLANT
SUT CHROMIC 2 0 SH (SUTURE) ×6 IMPLANT
SUT ETHILON 2 0 PS N (SUTURE) ×2 IMPLANT
SUT ETHILON 3 0 PS 1 (SUTURE) IMPLANT
SUT MNCRL AB 4-0 PS2 18 (SUTURE) ×4 IMPLANT
SUT MON AB 2-0 SH 27 (SUTURE)
SUT MON AB 2-0 SH27 (SUTURE) IMPLANT
SUT NOVA NAB DX-16 0-1 5-0 T12 (SUTURE) IMPLANT
SUT PDS AB 1 TP1 54 (SUTURE) IMPLANT
SUT PDS AB 1 TP1 96 (SUTURE) ×10 IMPLANT
SUT PDS AB 4-0 RB1 27 (SUTURE) IMPLANT
SUT PROLENE 3 0 SH 48 (SUTURE) ×4 IMPLANT
SUT PROLENE 4 0 RB 1 (SUTURE)
SUT PROLENE 4-0 RB1 .5 CRCL 36 (SUTURE) ×4 IMPLANT
SUT PROLENE 5 0 CC 1 (SUTURE) IMPLANT
SUT SILK 0 (SUTURE)
SUT SILK 0 30XBRD TIE 6 (SUTURE) ×2 IMPLANT
SUT SILK 2 0 (SUTURE) ×4
SUT SILK 2 0 SH (SUTURE) ×2 IMPLANT
SUT SILK 2 0 SH CR/8 (SUTURE) ×4 IMPLANT
SUT SILK 2-0 18XBRD TIE 12 (SUTURE) ×2 IMPLANT
SUT SILK 2-0 30XBRD TIE 12 (SUTURE) ×2 IMPLANT
SUT SILK 3 0 (SUTURE)
SUT SILK 3-0 18XBRD TIE 12 (SUTURE) ×2 IMPLANT
SUT VIC AB 0 CT1 18XCR BRD 8 (SUTURE) IMPLANT
SUT VIC AB 0 CT1 8-18 (SUTURE) ×8
SUT VIC AB 1 CTX 18 (SUTURE) IMPLANT
SUT VIC AB 2-0 CT1 27 (SUTURE) ×4
SUT VIC AB 2-0 CT1 TAPERPNT 27 (SUTURE) IMPLANT
SUT VIC AB 2-0 CTX 36 (SUTURE) ×4 IMPLANT
SUT VIC AB 2-0 SH 27 (SUTURE)
SUT VIC AB 2-0 SH 27X BRD (SUTURE) ×8 IMPLANT
SUT VIC AB 3-0 SH 18 (SUTURE) ×4 IMPLANT
SUT VIC AB 3-0 SH 8-18 (SUTURE) ×2 IMPLANT
SUT VIC AB 4-0 RB1 27 (SUTURE)
SUT VIC AB 4-0 RB1 27XBRD (SUTURE) IMPLANT
SYR CONTROL 10ML LL (SYRINGE) ×2 IMPLANT
SYR TOOMEY IRRIG 70ML (MISCELLANEOUS)
SYRINGE TOOMEY IRRIG 70ML (MISCELLANEOUS) IMPLANT
TAPE UMBILICAL COTTON 1/8X30 (MISCELLANEOUS) ×2 IMPLANT
TOWEL OR 17X26 10 PK STRL BLUE (TOWEL DISPOSABLE) ×8 IMPLANT
TRAY FOLEY MTR SLVR 16FR STAT (SET/KITS/TRAYS/PACK) ×2 IMPLANT
TUBE FEEDING 5FR 36IN KANGAROO (TUBING) IMPLANT
VESSEL LOOPS MINI RED (MISCELLANEOUS)

## 2020-02-26 NOTE — Anesthesia Procedure Notes (Signed)
Procedure Name: Intubation Date/Time: 02/26/2020 10:01 AM Performed by: Raenette Rover, CRNA Pre-anesthesia Checklist: Patient identified, Emergency Drugs available, Suction available and Patient being monitored Patient Re-evaluated:Patient Re-evaluated prior to induction Oxygen Delivery Method: Circle system utilized Preoxygenation: Pre-oxygenation with 100% oxygen Induction Type: IV induction Ventilation: Mask ventilation without difficulty Laryngoscope Size: Glidescope Grade View: Grade I Tube type: Oral Tube size: 7.5 mm Airway Equipment and Method: Rigid stylet and Video-laryngoscopy Placement Confirmation: ETT inserted through vocal cords under direct vision,  breath sounds checked- equal and bilateral and positive ETCO2 Secured at: 23 cm Tube secured with: Tape Dental Injury: Teeth and Oropharynx as per pre-operative assessment  Comments: First attempt by Burnadette Pop w/ Miller 3, grade IV view, switched to Glidescope, grade I view, successful intubation w/o issues otherwise

## 2020-02-26 NOTE — Transfer of Care (Addendum)
Immediate Anesthesia Transfer of Care Note  Patient: AIDON KLEMENS  Procedure(s) Performed: OPEN PARTIAL HEPATECTOMY, CHOLECYSTECTOMY (N/A ) OPEN RIGHT RENAL CELL METASTATECTOMY AND  ADRENALECTOMY (Right )  Patient Location: PACU  Anesthesia Type:GA combined with regional for post-op pain  Level of Consciousness: awake, drowsy, patient cooperative and responds to stimulation  Airway & Oxygen Therapy: Patient Spontanous Breathing and Patient connected to face mask oxygen  Post-op Assessment: Report given to RN and Post -op Vital signs reviewed and stable  Post vital signs: Reviewed and stable, neo gett d/c in pacu  Last Vitals:  Vitals Value Taken Time  BP 132/78 02/26/20 1515  Temp    Pulse 64 02/26/20 1522  Resp 25 02/26/20 1522  SpO2 100 % 02/26/20 1522  Vitals shown include unvalidated device data.  Last Pain:  Vitals:   02/26/20 0804  TempSrc: Oral  PainSc: 0-No pain         Complications: No complications documented.

## 2020-02-26 NOTE — Op Note (Addendum)
Preoperative diagnosis:  1. Oligometastatic recurrent kidney cancer  Postoperative diagnosis:  1. Same  Procedure: 1. Right adrenalectomy and metastatic resection of recurrent kidney cancer in the right retroperitoneum  Surgeon: [name], MD  Anesthesia: General  Complications: None  Intraoperative findings:  #1: The Gerota's fascia was taken off the kidney and the adrenal gland and part of the liver was included in the resection en bloc.  The tissue around the previously resected area was very stuck and adherent to both the kidney and the liver. #2: The adrenal vessels were difficult to isolate which did result in a 2 L of blood loss during this portion of the case. #3: The duodenal was densely adherent and the serosa on the posterior aspect was torn while dissecting it off the kidney  EBL: 2.9L  Infusions:  3 Units PRBC 1 unit FFP   Specimens:  #1: Nodule on mesentery adjacent to the right kidney #2: Gallbladder #3: En bloc resection of recurrent metastatic kidney cancer #4: Nodule in the right retroperitoneum posterior laterally  Indication: Antonio Adams is a 74 y.o. patient with history of renal cell carcinoma and who underwent a partial nephrectomy 6 years prior.  As part of his surveillance he was noted to have recurrence/metastatic disease.  This was biopsy-proven.  After reviewing the management options for treatment, he elected to proceed with the above surgical procedure(s). We have discussed the potential benefits and risks of the procedure, side effects of the proposed treatment, the likelihood of the patient achieving the goals of the procedure, and any potential problems that might occur during the procedure or recuperation. Informed consent has been obtained.  Description of procedure:  The patient was taken to the operating room and general anesthesia was induced.  The patient was placed in the supine position and bumped slightly under the right  flank, prepped and draped in the usual sterile fashion, and preoperative antibiotics were administered. A preoperative time-out was performed.   A right subcostal incision was made and dissected down into the abdomen.  The liver was mobilized and the duodenum was kocherized.  The gallbladder was removed.  Please see Dr. Marlowe Aschoff dictation for more details.  I have again by dissecting the plane between Gerota's fascia and the kidney.  This was done starting at the lower pole and working around to the upper pole where the disease was readily present.  Here we did enter into the capsule of the kidney and followed it up superiorly to the upper pole, the site of the previous partial nephrectomy resection.  It was in this area where it was most adherent, and as a result the adrenal vessels were not readily apparent or easily dissected.  I did have some difficulty initially controlling these vessels but was able to ultimately by using a silk suture ligature and a series of small and medium sized clips.  Once the plane had been fully dissected we focused our attention posteriorly and laterally.  This was performed with a combination of the harmonic scalpel and Bovie cautery.  We did identify the nodule in the inferior posterior retroperitoneum and remove this as a separate specimen.  Ultimately, the remainder of the specimen was able to be removed en bloc.  At this time it was noted that there was a small fracture in the kidney from retraction which I repaired with a 0 chromic on a blunt tipped needle and interrupted fashion.  I then took the anterior aspect of Gerota's fascia and was  able to mobilize this enough to use this as a flap around the upper pole area.  This was secured to the parenchyma with a 2-0 Vicryl.  Prior to placing the Gerota's fascia buttress I injected FloSeal and Surgicel in the upper pole in the area of the adrenal bed where we had encountered significant bleeding.  There was some mild bleeding  from the parenchyma on the anterior aspect that we controlled using the argon beam.  However, hemostasis was noted to be quite good at the time of closure.  We subsequently wrapped the kidney with a large tongue of mesentery and placed a 19 [place] Blake drain in the right lower quadrant.  The posterior peritoneal fascia was closed with a 2-0 Vicryl in interrupted fashion.  Anterior rectus fascia was then closed with a looped running PDS.  3-0 Vicryl's were used to close the deep dermal layer. 4-0 Monocryl's were used to close the skin in a subcuticular fashion.  At the the end of the case all laps needles and sponges had been accounted for.  During the case the patient did receive total of 3 units of packed red blood cells and a unit of FFP.  He was also given some albumin and left the operating room extubated but on neo-Synephrine.      [name], M.D.

## 2020-02-26 NOTE — Anesthesia Procedure Notes (Signed)
Arterial Line Insertion Start/End10/14/2021 8:40 AM, 02/26/2020 8:44 AM Performed by: Janeece Riggers, MD, Raenette Rover, CRNA, CRNA  Patient location: Pre-op. Preanesthetic checklist: patient identified, IV checked, risks and benefits discussed, surgical consent, monitors and equipment checked, pre-op evaluation and timeout performed Lidocaine 1% used for infiltration and patient sedated Right, radial was placed Catheter size: 20 G Hand hygiene performed  and maximum sterile barriers used   Attempts: 1 Procedure performed without using ultrasound guided technique. Following insertion, dressing applied and Biopatch. Post procedure assessment: normal  Patient tolerated the procedure well with no immediate complications.

## 2020-02-26 NOTE — Op Note (Signed)
PRE-OPERATIVE DIAGNOSIS: Recurrent renal cell cancer  POST-OPERATIVE DIAGNOSIS:  Same  PROCEDURE:  Procedure(s): Partial right hepatectomy, open cholecystectomy  SURGEON:  Surgeon(s): Stark Klein, MD  ASSISTANT: Ahmed Prima, MD, PGY5  ANESTHESIA:   epidural and general   DRAINS: (17 Fr) Blake drain(s) in the RUQ   LOCAL MEDICATIONS USED:  none  SPECIMEN:  Source of Specimen:  gallbladder, liver en bloc with recurrent tumor  DISPOSITION OF SPECIMEN:  PATHOLOGY  COUNTS:  YES  DICTATION: .Dragon Dictation  PLAN OF CARE: Admit to inpatient   PATIENT DISPOSITION:  PACU - hemodynamically stable.  FINDINGS:  Adhesions from prior surgery, gallstones, firm tumor in right adrenal.    EBL: 3L  PROCEDURE:   Patient was identified in the holding area and taken to the operating room where he was placed supine on the operating room table.  General anesthesia was induced.  He was bumped to be slightly lateral.  A timeout was performed according to the surgical safety checklist.  When all was correct, we continued.  A right subcostal incision was made with a #10 blade.  The cautery was used to divide the subcutaneous tissues.  The fascia was opened as well with the cautery and the muscle layers were raised and divided with cautery.  The peritoneum was elevated and opened.  The underlying viscera were protected and the fascial incision was opened the length of the skin incision.  The Bookwalter self-retaining retractor was set up to assist with visualization.  The gallbladder was identified.  The omentum was stuck up along the border of the liver.  This was taken down with cautery.  The gallbladder was then elevated with a long Kelly.  The dissection was carried out as well with the Overholt clamp and cautery.  The cystic duct was clipped and divided.  The cystic artery was divided as well.  The inferolateral liver did appear to be adherent to the recurrent renal cell cancer.  The  liver was scored in the MRI scalpel was used to divide the liver parenchyma.  Once this was done the argon was used to coagulate the remaining liver bed and the gallbladder fossa.  The duodenum was then kocherized in order to better visualize the recurrent tumor and the area surrounding the right adrenal gland.  The duodenum was retracted medially.  The posterior liver was also elevated and the adhesions were taken off to the posterior abdominal wall to assist with visualization.  The patient was then left with Dr. Louis Meckel to continue the en bloc resection of the recurrent renal cell tumor.  I was asked to evaluate the colon later.  I reflected the colon medially and identified the appendix.  The colon was able to be reflected medially and be visualized to be separate from the tumor.  There was a serosal defect noted on the duodenum at that point.  This was oversewn with interrupted 3-0 Vicryl sutures.  The remainder of the case continued with Dr. Louis Meckel. He will dictate this part.  A 19 [place] Blake drain was placed in the right upper quadrant.  This was secured with a 2-0 nylon and connected to a bili bag. Fascial and skin closure were performed by Dr. Louis Meckel.  The patient was allowed to emerge from anesthesia and taken to the PACU in stable condition.  Needle, sponge, and instrument counts were correct x2.

## 2020-02-26 NOTE — Interval H&P Note (Signed)
History and Physical Interval Note:  02/26/2020 9:43 AM  Antonio Adams  has presented today for surgery, with the diagnosis of RECURRENT RENAL CELL CANCER, GASLLSTONES.  The various methods of treatment have been discussed with the patient and family. After consideration of risks, benefits and other options for treatment, the patient has consented to  Procedure(s): POSSIBLE OPEN PARTIAL HEPATECTOMY, POSSIBLE CHOLECYSTECTOMY (N/A) OPEN RIGHT ADRENALECTOMY (Right) POSSIBLE RIGHT NEPHRECTOMY (Right) as a surgical intervention.  The patient's history has been reviewed, patient examined, no change in status, stable for surgery.  I have reviewed the patient's chart and labs.  Questions were answered to the patient's satisfaction.     Stark Klein

## 2020-02-26 NOTE — Anesthesia Procedure Notes (Signed)
Epidural Patient location during procedure: holding area Start time: 02/26/2020 8:39 AM End time: 02/26/2020 8:39 AM  Staffing Anesthesiologist: Janeece Riggers, MD  Preanesthetic Checklist Completed: patient identified, IV checked, site marked, risks and benefits discussed, surgical consent, monitors and equipment checked, pre-op evaluation and timeout performed  Epidural Patient position: sitting Prep: DuraPrep and site prepped and draped Patient monitoring: heart rate, cardiac monitor, continuous pulse ox and blood pressure Approach: midline Location: thoracic (1-12) Injection technique: LOR air  Needle:  Needle type: Tuohy  Needle gauge: 17 G Needle length: 9 cm and 9 Catheter type: closed end flexible Catheter size: 19 Gauge Catheter at skin depth: 10 cm Test dose: negative and 2% lidocaine with Epi 1:200 K  Assessment Events: blood not aspirated, injection not painful, no injection resistance, no paresthesia and negative IV test

## 2020-02-26 NOTE — Interval H&P Note (Signed)
History and Physical Interval Note:  02/26/2020 8:34 AM  Antonio Adams  has presented today for surgery, with the diagnosis of RECURRENT RENAL CELL CANCER, GASLLSTONES.  The various methods of treatment have been discussed with the patient and family. After consideration of risks, benefits and other options for treatment, the patient has consented to  Procedure(s): POSSIBLE OPEN PARTIAL HEPATECTOMY, POSSIBLE CHOLECYSTECTOMY (N/A) OPEN RIGHT ADRENALECTOMY (Right) POSSIBLE RIGHT NEPHRECTOMY (Right) as a surgical intervention.  The patient's history has been reviewed, patient examined, no change in status, stable for surgery.  I have reviewed the patient's chart and labs.  Questions were answered to the patient's satisfaction.     [name]

## 2020-02-26 NOTE — Anesthesia Postprocedure Evaluation (Signed)
Anesthesia Post Note  Patient: Antonio Adams  Procedure(s) Performed: OPEN PARTIAL HEPATECTOMY, CHOLECYSTECTOMY (N/A ) OPEN RIGHT RENAL CELL METASTATECTOMY AND  ADRENALECTOMY (Right )     Patient location during evaluation: PACU Anesthesia Type: Epidural and General Level of consciousness: awake and alert Pain management: pain level controlled Vital Signs Assessment: post-procedure vital signs reviewed and stable Respiratory status: spontaneous breathing, nonlabored ventilation, respiratory function stable and patient connected to nasal cannula oxygen Cardiovascular status: blood pressure returned to baseline and stable Postop Assessment: no apparent nausea or vomiting Anesthetic complications: no   No complications documented.  Last Vitals:  Vitals:   02/26/20 1630 02/26/20 1645  BP: 115/69 123/71  Pulse: (!) 57 (!) 54  Resp: 16 10  Temp:  (!) 36.4 C  SpO2: 100% 100%    Last Pain:  Vitals:   02/26/20 1645  TempSrc:   PainSc: 0-No pain                 Samayah Novinger
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
## 2020-02-26 NOTE — Progress Notes (Signed)
S/p right renal cell carcinoma metastatectomy including right adrenal gland, partial hepatectomy and posterior gerota's fascia.  Resting comfortably in bed. Complaining of discomfort from NG tube and right shoulder pain  PE: NAD - resting comfortably  Vitals:   02/26/20 1725 02/26/20 1730 02/26/20 1800 02/26/20 1830  BP:      Pulse:  65 64 65  Resp: 18 16 17 18   Temp:   (!) 97.4 F (36.3 C)   TempSrc:   Axillary   SpO2: 100% 100% 100% 100%  Weight:      Height:       On 49mcg/min of phenylephrine - MAP 76 100% on 2L [place]   Intake/Output Summary (Last 24 hours) at 02/26/2020 1908 Last data filed at 02/26/2020 1800 Gross per 24 hour  Intake 6429 ml  Output 3515 ml  Net 2914 ml   100cc UOP in < 2 hours JP - 275 - serosanginous Abdominal incision c/d/i Foley draining straw colored urine SCDs in place  Recent Labs    02/26/20 1224 02/26/20 1423 02/26/20 1526  WBC  --   --  3.0*  HGB 8.8* 8.5* 8.4*  HCT 26.0* 25.0* 24.1*   Recent Labs    02/26/20 1224 02/26/20 1423 02/26/20 1526  NA 135 137 139  K 4.9 5.6* 5.5*  CL  --   --  109  CO2  --   --  21*  GLUCOSE  --   --  193*  BUN  --   --  23  CREATININE  --   --  2.09*  CALCIUM  --   --  9.1   Recent Labs    02/26/20 1526  INR 1.3*   No results for input(s): PSA in the last 72 hours. No results for input(s): LABURIN in the last 72 hours. Results for orders placed or performed during the hospital encounter of 02/23/20  SARS CORONAVIRUS 2 (TAT 6-24 HRS) Nasopharyngeal Nasopharyngeal Swab     Status: None   Collection Time: 02/23/20  9:49 AM   Specimen: Nasopharyngeal Swab  Result Value Ref Range Status   SARS Coronavirus 2 NEGATIVE NEGATIVE Final    Comment: (NOTE) SARS-CoV-2 target nucleic acids are NOT DETECTED.  The SARS-CoV-2 RNA is generally detectable in upper and lower respiratory specimens during the acute phase of infection. Negative results do not preclude SARS-CoV-2 infection, do not rule  out co-infections with other pathogens, and should not be used as the sole basis for treatment or other patient management decisions. Negative results must be combined with clinical observations, patient history, and epidemiological information. The expected result is Negative.  Fact Sheet for Patients: SugarRoll.be  Fact Sheet for Healthcare Providers: https://www.woods-mathews.com/  This test is not yet approved or cleared by the [place] FDA and  has been authorized for detection and/or diagnosis of SARS-CoV-2 by FDA under an Emergency Use Authorization (EUA). This EUA will remain  in effect (meaning this test can be used) for the duration of the COVID-19 declaration under Se ction 564(b)(1) of the Act, 21 U.S.C. section 360bbb-3(b)(1), unless the authorization is terminated or revoked sooner.  Performed at [facility], [place] [address] [place], [place] [address]     INR - 1.3   Imp: Doing well, epidural working nicely, BP stable and improving.    Plan: Wean neo, bolus as needed.   Labs in the AM NGT per Gen Surg - ice chips okay. Start SQH in AM.

## 2020-02-27 ENCOUNTER — Other Ambulatory Visit: Payer: Self-pay

## 2020-02-27 ENCOUNTER — Encounter (HOSPITAL_COMMUNITY): Payer: Self-pay | Admitting: Anesthesiology

## 2020-02-27 LAB — BASIC METABOLIC PANEL
Anion gap: 7 (ref 5–15)
BUN: 31 mg/dL — ABNORMAL HIGH (ref 8–23)
CO2: 22 mmol/L (ref 22–32)
Calcium: 8.5 mg/dL — ABNORMAL LOW (ref 8.9–10.3)
Chloride: 109 mmol/L (ref 98–111)
Creatinine, Ser: 2.7 mg/dL — ABNORMAL HIGH (ref 0.61–1.24)
GFR, Estimated: 22 mL/min — ABNORMAL LOW (ref >60)
Glucose, Bld: 104 mg/dL — ABNORMAL HIGH (ref 70–99)
Potassium: 5.2 mmol/L — ABNORMAL HIGH (ref 3.5–5.1)
Sodium: 138 mmol/L (ref 135–145)

## 2020-02-27 LAB — CBC
HCT: 20.5 % — ABNORMAL LOW (ref 39.0–52.0)
HCT: 22.3 % — ABNORMAL LOW (ref 39.0–52.0)
Hemoglobin: 7 g/dL — ABNORMAL LOW (ref 13.0–17.0)
Hemoglobin: 7.5 g/dL — ABNORMAL LOW (ref 13.0–17.0)
MCH: 29.3 pg (ref 26.0–34.0)
MCH: 29.1 pg (ref 26.0–34.0)
MCHC: 34.1 g/dL (ref 30.0–36.0)
MCHC: 33.6 g/dL (ref 30.0–36.0)
MCV: 85.8 fL (ref 80.0–100.0)
MCV: 86.4 fL (ref 80.0–100.0)
Platelets: 87 10*3/uL — ABNORMAL LOW (ref 150–400)
Platelets: 76 10*3/uL — ABNORMAL LOW (ref 150–400)
RBC: 2.39 MIL/uL — ABNORMAL LOW (ref 4.22–5.81)
RBC: 2.58 MIL/uL — ABNORMAL LOW (ref 4.22–5.81)
RDW: 15 % (ref 11.5–15.5)
RDW: 16.6 % — ABNORMAL HIGH (ref 11.5–15.5)
WBC: 4.7 10*3/uL (ref 4.0–10.5)
WBC: 5.3 10*3/uL (ref 4.0–10.5)
nRBC: 0 % (ref 0.0–0.2)
nRBC: 0 % (ref 0.0–0.2)

## 2020-02-27 LAB — BPAM FFP
Blood Product Expiration Date: 202110192359
Blood Product Expiration Date: 202110192359
ISSUE DATE / TIME: 202110141427
ISSUE DATE / TIME: 202110141446
Unit Type and Rh: 5100
Unit Type and Rh: 5100

## 2020-02-27 LAB — GLUCOSE, CAPILLARY
Glucose-Capillary: 101 mg/dL — ABNORMAL HIGH (ref 70–99)
Glucose-Capillary: 97 mg/dL (ref 70–99)
Glucose-Capillary: 83 mg/dL (ref 70–99)
Glucose-Capillary: 103 mg/dL — ABNORMAL HIGH (ref 70–99)
Glucose-Capillary: 125 mg/dL — ABNORMAL HIGH (ref 70–99)
Glucose-Capillary: 114 mg/dL — ABNORMAL HIGH (ref 70–99)

## 2020-02-27 LAB — PREPARE FRESH FROZEN PLASMA: Unit division: 0

## 2020-02-27 LAB — PREPARE RBC (CROSSMATCH)

## 2020-02-27 LAB — HEMOGLOBIN AND HEMATOCRIT, BLOOD
HCT: 23.8 % — ABNORMAL LOW (ref 39.0–52.0)
Hemoglobin: 7.9 g/dL — ABNORMAL LOW (ref 13.0–17.0)

## 2020-02-27 MED ORDER — LIDOCAINE HCL (CARDIAC) PF 100 MG/5ML IV SOSY
PREFILLED_SYRINGE | INTRAVENOUS | Status: AC
  Filled 2020-02-27: qty 5

## 2020-02-27 MED ORDER — SODIUM CHLORIDE 0.9 % IV SOLN: INTRAVENOUS | Status: DC

## 2020-02-27 MED ORDER — MIDAZOLAM HCL 2 MG/2ML IJ SOLN
INTRAMUSCULAR | Status: AC
  Filled 2020-02-27: qty 4

## 2020-02-27 MED ORDER — LIP MEDEX EX OINT
TOPICAL_OINTMENT | CUTANEOUS | Status: DC | PRN
  Filled 2020-02-27: qty 7

## 2020-02-27 MED ORDER — AMLODIPINE BESYLATE 5 MG PO TABS
2.5000 mg | ORAL_TABLET | Freq: Every day | ORAL | Status: DC
  Administered 2020-02-27 – 2020-02-29 (×2): 2.5 mg via ORAL
  Filled 2020-02-27 (×2): qty 1

## 2020-02-27 MED ORDER — FUROSEMIDE 10 MG/ML IJ SOLN
40.0000 mg | Freq: Once | INTRAMUSCULAR | Status: AC
  Administered 2020-02-27: 40 mg via INTRAVENOUS
  Filled 2020-02-27: qty 4

## 2020-02-27 MED ORDER — DILTIAZEM HCL 60 MG PO TABS
120.0000 mg | ORAL_TABLET | Freq: Four times a day (QID) | ORAL | Status: DC
  Administered 2020-02-27 – 2020-03-03 (×20): 120 mg via ORAL
  Filled 2020-02-27 (×6): qty 2
  Filled 2020-02-27: qty 4
  Filled 2020-02-27 (×13): qty 2

## 2020-02-27 MED ORDER — ACETAMINOPHEN 10 MG/ML IV SOLN
1000.0000 mg | Freq: Four times a day (QID) | INTRAVENOUS | Status: DC
  Administered 2020-02-27 – 2020-02-28 (×3): 1000 mg via INTRAVENOUS
  Filled 2020-02-27 (×3): qty 100

## 2020-02-27 MED ORDER — FENTANYL CITRATE (PF) 100 MCG/2ML IJ SOLN
INTRAMUSCULAR | Status: AC
  Filled 2020-02-27: qty 2

## 2020-02-27 MED ORDER — HYDRALAZINE HCL 20 MG/ML IJ SOLN
10.0000 mg | Freq: Four times a day (QID) | INTRAMUSCULAR | Status: DC | PRN
  Administered 2020-02-29: 10 mg via INTRAVENOUS
  Filled 2020-02-27: qty 1

## 2020-02-27 MED ORDER — ROCURONIUM BROMIDE 10 MG/ML (PF) SYRINGE
PREFILLED_SYRINGE | INTRAVENOUS | Status: AC
  Filled 2020-02-27: qty 10

## 2020-02-27 MED ORDER — VECURONIUM BROMIDE 10 MG IV SOLR
INTRAVENOUS | Status: AC
  Filled 2020-02-27: qty 10

## 2020-02-27 MED ORDER — SODIUM CHLORIDE 0.9% IV SOLUTION: Freq: Once | INTRAVENOUS | Status: DC

## 2020-02-27 MED ORDER — STERILE WATER FOR INJECTION IJ SOLN
INTRAMUSCULAR | Status: AC
  Filled 2020-02-27: qty 10

## 2020-02-27 MED ORDER — PROPOFOL 500 MG/50ML IV EMUL
INTRAVENOUS | Status: AC
  Filled 2020-02-27: qty 50

## 2020-02-27 MED ORDER — ETOMIDATE 2 MG/ML IV SOLN
INTRAVENOUS | Status: AC
  Filled 2020-02-27: qty 20

## 2020-02-27 MED ORDER — SUCCINYLCHOLINE CHLORIDE 200 MG/10ML IV SOSY
PREFILLED_SYRINGE | INTRAVENOUS | Status: AC
  Filled 2020-02-27: qty 10

## 2020-02-27 NOTE — Progress Notes (Signed)
1 Day Post-Op s/p open partial hepatectomy, cholecystectomy and right renal cell carcinoma metastatectomy and adrenalectomy  Subjective: Antonio Adams is doing well without complaints at this time  Objective: Vital signs in last 24 hours: Temp:  [96 F (35.6 C)-100 F (37.8 C)] 100 F (37.8 C) (10/15 0342) Pulse Rate:  [54-85] 81 (10/15 0430) Resp:  [10-23] 21 (10/15 0530) BP: (77-189)/(54-93) 123/71 (10/14 1645) SpO2:  [92 %-100 %] 100 % (10/15 0530) Arterial Line BP: (87-160)/(41-87) 116/48 (10/15 0530) Weight:  [94.8 kg] 94.8 kg (10/14 1710)   Intake/Output from previous day: 10/14 0701 - 10/15 0700 In: 8513.2 [I.V.:4919.7; Blood:1818; IV Piggyback:1739.5] Out: 1025 [Urine:690; Emesis/NG output:400; Drains:595; Blood:2900] Intake/Output this shift: No intake/output data recorded.   General appearance: alert and no distress Resp: clear to auscultation bilaterally Cardio: regular rate and rhythm, S1, S2 normal, no murmur, click, rub or gallop GI: soft, tender in RUQ near incision, incision c/d/i with surgical dressing in place, JP with SS fluid Extremities: extremities normal, atraumatic, no cyanosis or edema   Lab Results:  Recent Labs    02/26/20 1526 02/27/20 0254  WBC 3.0* 4.7  HGB 8.4* 7.0*  HCT 24.1* 20.5*  PLT 95* 87*   BMET Recent Labs    02/26/20 1526 02/27/20 0254  NA 139 138  K 5.5* 5.2*  CL 109 109  CO2 21* 22  GLUCOSE 193* 104*  BUN 23 31*  CREATININE 2.09* 2.70*  CALCIUM 9.1 8.5*   PT/INR Recent Labs    02/26/20 1526  LABPROT 15.8*  INR 1.3*   ABG Recent Labs    02/26/20 1224 02/26/20 1423  PHART 7.305* 7.231*  HCO3 23.6 20.4    MEDS, Scheduled . Chlorhexidine Gluconate Cloth  6 each Topical Daily  . diltiazem  120 mg Oral QID  . docusate sodium  100 mg Oral BID  . etomidate      . fentaNYL      . insulin aspart  0-15 Units Subcutaneous Q4H  . lidocaine (cardiac) 100 mg/58mL      . mouth rinse  15 mL Mouth Rinse BID  .  midazolam      . pantoprazole (PROTONIX) IV  40 mg Intravenous Q24H  . rocuronium bromide      . sterile water (preservative free)      . succinylcholine      . tamsulosin  0.4 mg Oral Daily  . vecuronium        Studies/Results: No results found.  Assessment: s/p Procedure(s): OPEN PARTIAL HEPATECTOMY, CHOLECYSTECTOMY OPEN RIGHT RENAL CELL METASTATECTOMY AND  ADRENALECTOMY Patient Active Problem List   Diagnosis Date Noted  . Metastatic renal cell carcinoma ([place]) 02/26/2020  . Diabetic polyneuropathy associated with type 2 diabetes mellitus ([place]) 12/18/2019  . Neck pain, bilateral 10/08/2018  . CKD (chronic kidney disease) stage 3, GFR 30-59 ml/min (HCC) 10/08/2018  . Murmur, cardiac 10/08/2018  . DDD (degenerative disc disease), cervical 10/08/2018  . OAB (overactive bladder) 04/04/2018  . Seasonal allergic rhinitis due to pollen 11/01/2016  . Microalbuminuria due to type 2 diabetes mellitus ([place]) 07/27/2016  . Renal cell carcinoma ([place]) 10/06/2015  . Varicose veins of both lower extremities 08/06/2015  . Varicose veins of lower extremities with ulcer ([place]) 06/16/2015  . Internal hemorrhoid, bleeding 08/03/2014  . Post traumatic stress disorder (PTSD) 01/29/2014  . Deficiency anemia 12/10/2013  . Radiation proctitis 06/04/2012  . Spinal stenosis of lumbar region with radiculopathy 10/30/2011    Class: Chronic  . OSA (obstructive sleep  apnea) 11/24/2010  . Obesity, Class I, BMI 30-34.9 11/24/2010  . Routine general medical examination at a health care facility 11/24/2010  . ADENOCARCINOMA, PROSTATE, GLEASON GRADE 6 12/21/2009  . Type II diabetes mellitus with manifestations ([place]) 03/16/2007  . Hyperlipidemia with target LDL less than 100 03/16/2007  . Essential hypertension 03/16/2007  . GERD 03/16/2007     Plan: Antonio Adams is a 74 yo M s/p open partial hepatectomy, cholecystectomy and right renal cell carcinoma metastatectomy and adrenalectomy. Case with 3L blood  loss and patient required 4u RBC and 2 FFP during case, has received another 1 PRBC on 10/15. Doing well, neo weaning.   LOS: 1 day   - d/c NGT, CLD - Up to chair and ambulate - Incentive spirometry - Wean neo as able  - Appreciate excellent care by ICU and Urology - Pain management per anesthesia for epidural, ok for tylenol and oxy from [place], MD [facility] PGY 5  General Surgery   02/27/2020 7:50 AM

## 2020-02-27 NOTE — Op Note (Signed)
  PREOPERATIVE DIAGNOSIS:  Metastatic renal cellular carcinoma invading into the liver  POSTOPERATIVE DIAGNOSIS:  Metastatic renal cellular carcinoma invading into the liver  PROCEDURE PERFORMED: Exploratory laparotomy partial hepatectomy  SURGEON:  Stark Klein, MD  ASSISTANT:  Ahmed Prima, MD [facility]  ANESTHESIA:  General and local.  FINDINGS:  Mass extending into segment 7 of the liver  SPECIMEN:  En bloc metastatectomy sent to Pathology.   ESTIMATED BLOOD LOSS:  3L  COMPLICATIONS:  none.  PROCEDURE:  The patient was identified in the holding area, taken to the operating room, and placed supine on the operating room table.  General endotracheal anesthesia was induced.  The abdomen was prepped and draped in a sterile fashion.  A time-out was performed according to the surgical safety check list.  When all was correct, we continued.    A right subcostal incision was made.  The subcutaneous tissues were divided with the Bovie and then the fascia was opened with the Bovie as well. The Bookwalter was then placed for assistance with visualization.  The adhesions were taken off the abdominal wall and gallbladder. The gallbladder was taken off the liver with a combination of blunt dissection and cautery. The cystic duct was clipped with the Hemalock clips. The cystic duct was divided and the gallbladder was passed off.    The liver was scored with the Bovie.  The harmonic scalpel wave was then used to divide the bulk of the liver parenchyma. The liver section was taken off of the liver and left attached en bloc to the metastasis. The argon beam coagulator was used to coagulate the liver bed.  Dr. Barry Dienes was present for all of the aforementioned portions of the case. Dr. Louis Meckel then proceeded with the remainder of the surgery and this portion can be seen in his dictation.  Ahmed Prima, MD [facility] PGY 5  General Surgery

## 2020-02-27 NOTE — Progress Notes (Signed)
Urology Inpatient Progress Report  Metastatic renal cell carcinoma (HCC) [C64.9]  Procedure(s): OPEN PARTIAL HEPATECTOMY, CHOLECYSTECTOMY OPEN RIGHT RENAL CELL METASTATECTOMY AND  ADRENALECTOMY  1 Day Post-Op  Intv/Subj: No acute events overnight. Patient is without complaint.  Shoulder pain is easily managed with pain medication.  Still on 18mcg of neo.  Active Problems:   Metastatic renal cell carcinoma (HCC)  Current Facility-Administered Medications  Medication Dose Route Frequency Provider Last Rate Last Admin  . 0.9 %  sodium chloride infusion (Manually program via Guardrails IV Fluids)   Intravenous Once [name], MD      . 0.9 %  sodium chloride infusion  250 mL Intravenous Continuous Janeece Riggers, MD   Held at 02/26/20 1805  . acetaminophen (OFIRMEV) IV 1,000 mg  1,000 mg Intravenous Q6H [name], MD   Stopped at 02/27/20 [phone]  . Chlorhexidine Gluconate Cloth 2 % PADS 6 each  6 each Topical Daily [name], MD   6 each at 02/26/20 1836  . diltiazem (CARDIZEM) tablet 120 mg  120 mg Oral QID [name], MD      . docusate sodium (COLACE) capsule 100 mg  100 mg Oral BID [name], MD      . etomidate (AMIDATE) 2 MG/ML injection           . fentaNYL (SUBLIMAZE) 100 MCG/2ML injection           . HYDROmorphone (DILAUDID) injection 0.5 mg  0.5 mg Intravenous Q2H PRN [name], MD   0.5 mg at 02/27/20 0412  . insulin aspart (novoLOG) injection 0-15 Units  0-15 Units Subcutaneous Q4H [name], MD   2 Units at 02/26/20 1956  . lactated ringers infusion   Intravenous Continuous [name], MD 125 mL/hr at 02/27/20 0535 Rate Verify at 02/27/20 0535  . lidocaine (cardiac) 100 mg/73mL (XYLOCAINE) 100 MG/5ML injection 2%           . MEDLINE mouth rinse  15 mL Mouth Rinse BID [name], MD   15 mL at 02/26/20 2103  . midazolam (VERSED) 2 MG/2ML injection           . ondansetron (ZOFRAN) injection 4  mg  4 mg Intravenous Q6H PRN [name], MD      . pantoprazole (PROTONIX) injection 40 mg  40 mg Intravenous Q24H [name], MD   40 mg at 02/26/20 2101  . phenylephrine (NEOSYNEPHRINE) 10-0.9 MG/250ML-% infusion  25-200 mcg/min Intravenous Titrated Janeece Riggers, MD 45 mL/hr at 02/27/20 0638 30 mcg/min at 02/27/20 8299  . propofol (DIPRIVAN) 500 MG/50ML infusion           . rocuronium bromide 100 MG/10ML SOSY           . ropivacaine (PF) 2 mg/mL (0.2%) (NAROPIN) injection  10 mL/hr Epidural Continuous Janeece Riggers, MD 6 mL/hr at 02/26/20 1725 6 mL/hr at 02/26/20 1725  . sterile water (preservative free) injection           . succinylcholine (ANECTINE) 200 MG/10ML syringe           . tamsulosin (FLOMAX) capsule 0.4 mg  0.4 mg Oral Daily [name], MD      . vecuronium (NORCURON) 10 MG injection              Objective: Vital: Vitals:   02/27/20 0415 02/27/20 0430 02/27/20 0515 02/27/20 0530  BP:      Pulse: 85 81  Resp: 18 (!) 21 20 (!) 21  Temp:      TempSrc:      SpO2: 100% 100% 100% 100%  Weight:      Height:       I/Os: I/O last 3 completed shifts: In: 8513.2 [I.V.:4919.7; Blood:1818; Other:36; IV Piggyback:1739.5] Out: 3545 [Urine:690; Emesis/NG output:400; Drains:595; Blood:2900] UOP - 30cc/hr JP - 190p MN NG - 500 total - clear  Physical Exam:  General: Patient is in no apparent distress Lungs: Normal respiratory effort, chest expands symmetrically. GI: Incisions are c/d/i.  The abdomen is soft and nontender. JP drain with serosanguinous drainage Foley: straw colored Ext: lower extremities symmetric  Lab Results: Recent Labs    02/26/20 1423 02/26/20 1526 02/27/20 0254  WBC  --  3.0* 4.7  HGB 8.5* 8.4* 7.0*  HCT 25.0* 24.1* 20.5*   Recent Labs    02/26/20 1423 02/26/20 1526 02/27/20 0254  NA 137 139 138  K 5.6* 5.5* 5.2*  CL  --  109 109  CO2  --  21* 22  GLUCOSE  --  193* 104*  BUN  --  23 31*  CREATININE  --   2.09* 2.70*  CALCIUM  --  9.1 8.5*   Recent Labs    02/26/20 1526  INR 1.3*   No results for input(s): LABURIN in the last 72 hours. Results for orders placed or performed during the hospital encounter of 02/26/20  MRSA PCR Screening     Status: None   Collection Time: 02/26/20  6:34 PM   Specimen: Nasal Mucosa; Nasopharyngeal  Result Value Ref Range Status   MRSA by PCR NEGATIVE NEGATIVE Final    Comment:        The GeneXpert MRSA Assay (FDA approved for NASAL specimens only), is one component of a comprehensive MRSA colonization surveillance program. It is not intended to diagnose MRSA infection nor to guide or monitor treatment for MRSA infections. Performed at [facility], [place] [address] [place], [place] [address]     Studies/Results: No results found.  Assessment: Procedure(s): OPEN PARTIAL HEPATECTOMY, CHOLECYSTECTOMY OPEN RIGHT RENAL CELL METASTATECTOMY AND  ADRENALECTOMY, 1 Day Post-Op  - Anemia from acute surgical blood loss - Hypotensive from hypovolemia - Pain controlled with epidural/ IV medication   Plan: Continue epidural - tentative plan to d/c on Sunday, anesthesia is requesting we hold SQH until epidural is out Transfuse 1 unit PRBC, wean Neo as able, hold home meds until hypotension resolves NGT and diet per Gen Surg SSI/PPI/SCDs Continue foley - close monitoring of I/Os.  If hgb responds appropriately to transfusion would like to get patient to chair this PM.   Louis Meckel, MD Urology 02/27/2020, 7:35 AM

## 2020-02-27 NOTE — Plan of Care (Signed)

## 2020-02-27 NOTE — Progress Notes (Signed)
On call urology MD made aware of K 5.2 & hgb 7.0. No new orders made at this time. Pt vitals stable at this time. This RN will continue to monitor

## 2020-02-27 NOTE — Progress Notes (Addendum)
S/p right renal cell carcinoma metastatectomy including right adrenal gland, partial hepatectomy and posterior gerota's fascia.  Resting comfortably in bed. Pain controlled with epidural. Passing flatus. Hasn't yet ambulated.   PE: NAD - resting comfortably. NGT in place draining light brown liquid   Vitals:   02/27/20 0415 02/27/20 0430 02/27/20 0515 02/27/20 0530  BP:      Pulse: 85 81    Resp: 18 (!) 21 20 (!) 21  Temp:      TempSrc:      SpO2: 100% 100% 100% 100%  Weight:      Height:       On 23mcg/min of phenylephrine - MAP 74 100% on 2L [place]   Intake/Output Summary (Last 24 hours) at 02/27/2020 0724 Last data filed at 02/27/2020 0700 Gross per 24 hour  Intake 8513.22 ml  Output 4585 ml  Net 3928.22 ml   350cc UOP in 8 hours JP - 595cc/190cc overnight - serosanginous Abdominal incision c/d/i. Nondistended. Appropriately tender Foley draining straw colored urine SCDs in place  Recent Labs    02/26/20 1423 02/26/20 1526 02/27/20 0254  WBC  --  3.0* 4.7  HGB 8.5* 8.4* 7.0*  HCT 25.0* 24.1* 20.5*   Recent Labs    02/26/20 1423 02/26/20 1526 02/27/20 0254  NA 137 139 138  K 5.6* 5.5* 5.2*  CL  --  109 109  CO2  --  21* 22  GLUCOSE  --  193* 104*  BUN  --  23 31*  CREATININE  --  2.09* 2.70*  CALCIUM  --  9.1 8.5*   Recent Labs    02/26/20 1526  INR 1.3*   No results for input(s): PSA in the last 72 hours. No results for input(s): LABURIN in the last 72 hours. Results for orders placed or performed during the hospital encounter of 02/26/20  MRSA PCR Screening     Status: None   Collection Time: 02/26/20  6:34 PM   Specimen: Nasal Mucosa; Nasopharyngeal  Result Value Ref Range Status   MRSA by PCR NEGATIVE NEGATIVE Final    Comment:        The GeneXpert MRSA Assay (FDA approved for NASAL specimens only), is one component of a comprehensive MRSA colonization surveillance program. It is not intended to diagnose MRSA infection nor to guide  or monitor treatment for MRSA infections. Performed at [facility], [place] [address] [place], [place] [address]      Imp: Doing well, epidural working nicely. Hgb downtrending  Plan: - Given 1u pRBC for downtrending Hgb this morning. Recheck Hgb afterward to ensure appropriate response - Wean phenylephrine, bolus as needed  - NGT per Gen Surg - ice chips okay - Continue Epidural until Sunday. No DVT ppx until epidural removed per Anesthesia - Continue foley while epidural in place - Continue JP drain

## 2020-02-27 NOTE — Anesthesia Post-op Follow-up Note (Signed)
  Anesthesia Pain Follow-up Note  Patient: Antonio Adams  Day #: 1  Date of Follow-up: 02/27/2020 Time: 3:35 PM  Last Vitals:  Vitals:   02/27/20 1300 02/27/20 1400  BP:  (!) 148/64  Pulse: 85 79  Resp: (!) 22 19  Temp:    SpO2: 97% 93%    Level of Consciousness: alert  Pain: moderate   Side Effects:None  Catheter Site Exam:clean  Epidural / Intrathecal (From admission, onward)   Start     Dose/Rate Route Frequency Ordered Stop   02/26/20 1015  ropivacaine (PF) 2 mg/mL (0.2%) (NAROPIN) injection        10 mL/hr 10 mL/hr  Epidural Continuous 02/26/20 1010 03/02/20 1014       Plan: Increase rate to 8 and then to 10 if break through pain occurs   Continue current therapy of postop epidural at surgeon's request  [place]

## 2020-02-27 NOTE — TOC Initial Note (Signed)
Transition of Care [facility]) - Initial/Assessment Note    Patient Details  Name: Antonio Adams MRN: [id number] Date of Birth: [birth year]  Transition of Care [facility]) CM/SW Contact:    Leeroy Cha, RN Phone Number: 02/27/2020, 1:11 PM  Clinical Narrative:                 1 Day Post-Op s/p open partial hepatectomy, cholecystectomy and right renal cell carcinoma metastatectomy and adrenalectomy following for progression and toc needs.  Expected Discharge Plan: Home/Self Care Barriers to Discharge: Barriers Unresolved (comment) (pod 1)   Patient Goals and CMS Choice Patient states their goals for this hospitalization and ongoing recovery are:: to go home CMS Medicare.gov Compare Post Acute Care list provided to:: Patient    Expected Discharge Plan and Services Expected Discharge Plan: Home/Self Care   Discharge Planning Services: CM Consult   Living arrangements for the past 2 months: Single Family Home                                      Prior Living Arrangements/Services Living arrangements for the past 2 months: Single Family Home Lives with:: Self Patient language and need for interpreter reviewed:: Yes Do you feel safe going back to the place where you live?: Yes      Need for Family Participation in Patient Care: Yes (Comment) Care giver support system in place?: Yes (comment)   Criminal Activity/Legal Involvement Pertinent to Current Situation/Hospitalization: No - Comment as needed  Activities of Daily Living      Permission Sought/Granted                  Emotional Assessment Appearance:: Appears stated age Attitude/Demeanor/Rapport: Engaged Affect (typically observed): Calm Orientation: : Oriented to Place, Oriented to Self, Oriented to  Time, Oriented to Situation Alcohol / Substance Use: Not Applicable Psych Involvement: No (comment)  Admission diagnosis:  Metastatic renal cell carcinoma ([place]) [C64.9] Patient Active Problem  List   Diagnosis Date Noted  . Metastatic renal cell carcinoma ([place]) 02/26/2020  . Diabetic polyneuropathy associated with type 2 diabetes mellitus ([place]) 12/18/2019  . Neck pain, bilateral 10/08/2018  . CKD (chronic kidney disease) stage 3, GFR 30-59 ml/min (HCC) 10/08/2018  . Murmur, cardiac 10/08/2018  . DDD (degenerative disc disease), cervical 10/08/2018  . OAB (overactive bladder) 04/04/2018  . Seasonal allergic rhinitis due to pollen 11/01/2016  . Microalbuminuria due to type 2 diabetes mellitus ([place]) 07/27/2016  . Renal cell carcinoma ([place]) 10/06/2015  . Varicose veins of both lower extremities 08/06/2015  . Varicose veins of lower extremities with ulcer ([place]) 06/16/2015  . Internal hemorrhoid, bleeding 08/03/2014  . Post traumatic stress disorder (PTSD) 01/29/2014  . Deficiency anemia 12/10/2013  . Radiation proctitis 06/04/2012  . Spinal stenosis of lumbar region with radiculopathy 10/30/2011    Class: Chronic  . OSA (obstructive sleep apnea) 11/24/2010  . Obesity, Class I, BMI 30-34.9 11/24/2010  . Routine general medical examination at a health care facility 11/24/2010  . ADENOCARCINOMA, PROSTATE, GLEASON GRADE 6 12/21/2009  . Type II diabetes mellitus with manifestations ([place]) 03/16/2007  . Hyperlipidemia with target LDL less than 100 03/16/2007  . Essential hypertension 03/16/2007  . GERD 03/16/2007   PCP:  [name], MD Pharmacy:   [id number] [place], [place] [address] Phone: [phone] Fax: [place]  [place], [place] [phone] [place] [address] Phone: [phone] Fax: [phone]     Social Determinants of Health (SDOH) Interventions    Readmission Risk Interventions No flowsheet data found.

## 2020-02-28 LAB — BASIC METABOLIC PANEL
Anion gap: 7 (ref 5–15)
BUN: 41 mg/dL — ABNORMAL HIGH (ref 8–23)
CO2: 21 mmol/L — ABNORMAL LOW (ref 22–32)
Calcium: 7.9 mg/dL — ABNORMAL LOW (ref 8.9–10.3)
Chloride: 108 mmol/L (ref 98–111)
Creatinine, Ser: 3.18 mg/dL — ABNORMAL HIGH (ref 0.61–1.24)
GFR, Estimated: 18 mL/min — ABNORMAL LOW (ref >60)
Glucose, Bld: 139 mg/dL — ABNORMAL HIGH (ref 70–99)
Potassium: 4.4 mmol/L (ref 3.5–5.1)
Sodium: 136 mmol/L (ref 135–145)

## 2020-02-28 LAB — GLUCOSE, CAPILLARY
Glucose-Capillary: 124 mg/dL — ABNORMAL HIGH (ref 70–99)
Glucose-Capillary: 129 mg/dL — ABNORMAL HIGH (ref 70–99)
Glucose-Capillary: 170 mg/dL — ABNORMAL HIGH (ref 70–99)
Glucose-Capillary: 149 mg/dL — ABNORMAL HIGH (ref 70–99)
Glucose-Capillary: 120 mg/dL — ABNORMAL HIGH (ref 70–99)
Glucose-Capillary: 119 mg/dL — ABNORMAL HIGH (ref 70–99)

## 2020-02-28 LAB — CBC
HCT: 21.1 % — ABNORMAL LOW (ref 39.0–52.0)
Hemoglobin: 7.2 g/dL — ABNORMAL LOW (ref 13.0–17.0)
MCH: 30 pg (ref 26.0–34.0)
MCHC: 34.1 g/dL (ref 30.0–36.0)
MCV: 87.9 fL (ref 80.0–100.0)
Platelets: 72 10*3/uL — ABNORMAL LOW (ref 150–400)
RBC: 2.4 MIL/uL — ABNORMAL LOW (ref 4.22–5.81)
RDW: 16.7 % — ABNORMAL HIGH (ref 11.5–15.5)
WBC: 5.8 10*3/uL (ref 4.0–10.5)
nRBC: 0 % (ref 0.0–0.2)

## 2020-02-28 LAB — HEMOGLOBIN AND HEMATOCRIT, BLOOD
HCT: 22.3 % — ABNORMAL LOW (ref 39.0–52.0)
Hemoglobin: 7.6 g/dL — ABNORMAL LOW (ref 13.0–17.0)

## 2020-02-28 MED ORDER — FUROSEMIDE 10 MG/ML IJ SOLN
40.0000 mg | Freq: Once | INTRAMUSCULAR | Status: AC
  Administered 2020-02-28: 40 mg via INTRAVENOUS
  Filled 2020-02-28: qty 4

## 2020-02-28 MED ORDER — ACETAMINOPHEN 500 MG PO TABS
1000.0000 mg | ORAL_TABLET | Freq: Four times a day (QID) | ORAL | Status: DC
  Administered 2020-02-28 – 2020-03-01 (×8): 1000 mg via ORAL
  Filled 2020-02-28 (×8): qty 2

## 2020-02-28 NOTE — Progress Notes (Signed)
0900 Patient ambulated to bathroom with stand by assist, then to recliner in room. VSS.   1040 Patient request to go back to bed, reports that he is feeling " oozy" assisted back to bed with 2 assist. VSS see flow sheet.

## 2020-02-28 NOTE — Addendum Note (Signed)
Addendum  created 02/28/20 1210 by Belinda Block, MD   Clinical Note Signed

## 2020-02-28 NOTE — Progress Notes (Addendum)
Urology Inpatient Progress Report  Metastatic renal cell carcinoma (HCC) [C64.9]  Procedure(s): OPEN PARTIAL HEPATECTOMY, CHOLECYSTECTOMY OPEN RIGHT RENAL CELL METASTATECTOMY AND  ADRENALECTOMY  2 Days Post-Op  Intv/Subj: NAEON. Weaned off neo at 10am yesterday with stable blood pressures. NGT removed yesterday and advanced to CLD. Tolerating CLD without nausea/emesis. Passing flatus and had BM yesterday. OOB to chair yesterday evening. Pain controlled.   Received 1u pRBC yesterday morning for Hgb 7 with sub-adequate response to 7.5. 7.9 on pm recheck but downtrended to 7.2 today.  Good UOP. Cr mildly elevated to 3.18 from baseline 2.1-2.2  Minimal SS JP output   Active Problems:   Metastatic renal cell carcinoma (HCC)  Current Facility-Administered Medications  Medication Dose Route Frequency Provider Last Rate Last Admin   acetaminophen (OFIRMEV) IV 1,000 mg  1,000 mg Intravenous Q6H [name], MD   Stopped at 02/28/20 0533   amLODipine (NORVASC) tablet 2.5 mg  2.5 mg Oral Daily [name], MD   2.5 mg at 02/27/20 1439   Chlorhexidine Gluconate Cloth 2 % PADS 6 each  6 each Topical Daily [name], MD   6 each at 02/27/20 1239   diltiazem (CARDIZEM) tablet 120 mg  120 mg Oral Q6H [name], MD   120 mg at 02/28/20 0516   docusate sodium (COLACE) capsule 100 mg  100 mg Oral BID [name], MD   100 mg at 02/27/20 2149   hydrALAZINE (APRESOLINE) injection 10 mg  10 mg Intravenous Q6H PRN [name], MD       HYDROmorphone (DILAUDID) injection 0.5 mg  0.5 mg Intravenous Q2H PRN [name], MD   0.5 mg at 02/27/20 1444   insulin aspart (novoLOG) injection 0-15 Units  0-15 Units Subcutaneous Q4H [name], MD   2 Units at 02/28/20 0334   lip balm (CARMEX) ointment   Topical PRN [name], MD       MEDLINE mouth rinse  15 mL Mouth Rinse BID [name], MD   15 mL at 02/27/20 2149    ondansetron (ZOFRAN) injection 4 mg  4 mg Intravenous Q6H PRN [name], MD       pantoprazole (PROTONIX) injection 40 mg  40 mg Intravenous Q24H [name], MD   40 mg at 02/27/20 2149   phenylephrine (NEOSYNEPHRINE) 10-0.9 MG/250ML-% infusion  0-200 mcg/min Intravenous Titrated [name], MD   Stopped at 02/27/20 1057   ropivacaine (PF) 2 mg/mL (0.2%) (NAROPIN) injection  0-10 mL/hr Epidural Continuous [name], MD 7 mL/hr at 02/28/20 0333 7 mL/hr at 02/28/20 0333   tamsulosin (FLOMAX) capsule 0.4 mg  0.4 mg Oral Daily [name], MD         Objective: Vital: Vitals:   02/28/20 0357 02/28/20 0405 02/28/20 0500 02/28/20 0700  BP:  (!) 127/52 (!) 158/66   Pulse:  76 77   Resp:  (!) 24 (!) 25   Temp: 99.2 F (37.3 C)   99 F (37.2 C)  TempSrc: Oral   Oral  SpO2:  98% 100%   Weight:      Height:       I/Os: I/O last 3 completed shifts: In: 6061.4 [P.O.:450; I.V.:4369.4; Blood:435.5; Other:222.1; IV Piggyback:584.5] Out: 7654 [Urine:2000; Emesis/NG output:600; Drains:435] UOP - 1.7L x 24 hr / 725cc since MN JP - 115cc x 24 hr / 30cc since MN  Physical Exam:  General: Patient is in no apparent  distress Lungs: Normal respiratory effort on 2L [place] GI: Incisions are c/d/i.  The abdomen is soft and nontender. JP drain with serosanguinous drainage Foley: straw colored Ext: lower extremities symmetric  Lab Results: Recent Labs    02/27/20 0254 02/27/20 0254 02/27/20 1400 02/27/20 2151 02/28/20 0523  WBC 4.7  --  5.3  --  5.8  HGB 7.0*   < > 7.5* 7.9* 7.2*  HCT 20.5*   < > 22.3* 23.8* 21.1*   < > = values in this interval not displayed.   Recent Labs    02/26/20 1526 02/27/20 0254 02/28/20 0523  NA 139 138 136  K 5.5* 5.2* 4.4  CL 109 109 108  CO2 21* 22 21*  GLUCOSE 193* 104* 139*  BUN 23 31* 41*  CREATININE 2.09* 2.70* 3.18*  CALCIUM 9.1 8.5* 7.9*   Recent Labs    02/26/20 1526  INR 1.3*   No results for  input(s): LABURIN in the last 72 hours. Results for orders placed or performed during the hospital encounter of 02/26/20  MRSA PCR Screening     Status: None   Collection Time: 02/26/20  6:34 PM   Specimen: Nasal Mucosa; Nasopharyngeal  Result Value Ref Range Status   MRSA by PCR NEGATIVE NEGATIVE Final    Comment:        The GeneXpert MRSA Assay (FDA approved for NASAL specimens only), is one component of a comprehensive MRSA colonization surveillance program. It is not intended to diagnose MRSA infection nor to guide or monitor treatment for MRSA infections. Performed at [facility], [place] [address], [place], [place] [address]     Studies/Results: No results found.  Assessment: Procedure(s): OPEN PARTIAL HEPATECTOMY, CHOLECYSTECTOMY OPEN RIGHT RENAL CELL METASTATECTOMY AND  ADRENALECTOMY, 2 Days Post-Op, overall doing well  - Anemia from acute surgical blood loss - Pain controlled with epidural/ IV medication  Plan: - Continue epidural - tentative plan to d/c on Sunday, anesthesia is requesting we hold SQH until epidural is out - Recheck PM H/H to ensure stable - Restarted BP medications yesterday since patient now mildly hypertensive. PRN hydralazine - Diet per Gen Surg. They recommend transitioning to full liquids today. Bowel regimen - Stop IVF given great UOP - Remove JP drain today since minimal output - Ambulate today. IS/SCDs - ISS  Celene Squibb, MD Urology Resident, PGY4  02/28/2020, 9:33 AM

## 2020-02-28 NOTE — Anesthesia Post-op Follow-up Note (Signed)
  Anesthesia Pain Follow-up Note  Patient: Antonio Adams  Day #: 2  Date of Follow-up: 02/28/2020 Time: 12:09 PM  Last Vitals:  Vitals:   02/28/20 1047 02/28/20 1100  BP:  (!) 123/53  Pulse: 72 70  Resp: (!) 24 (!) 23  Temp:    SpO2: 100% 99%    Level of Consciousness: alert  Pain: mild   Side Effects:None  Catheter Site Exam:clean  Epidural / Intrathecal (From admission, onward)   Start     Dose/Rate Route Frequency Ordered Stop   02/26/20 1015  ropivacaine (PF) 2 mg/mL (0.2%) (NAROPIN) injection        0-10 mL/hr 0-10 mL/hr  Epidural Continuous 02/26/20 1010 03/02/20 1014       Plan: Continue current therapy of postop epidural at surgeon's request  Casara Perrier

## 2020-02-28 NOTE — Progress Notes (Signed)
2 Days Post-Op   Subjective/Chief Complaint: Comfortable Tolerated clears Had BM's   Objective: Vital signs in last 24 hours: Temp:  [97.9 F (36.6 C)-100.2 F (37.9 C)] 99.2 F (37.3 C) (10/16 0357) Pulse Rate:  [64-90] 77 (10/16 0500) Resp:  [8-31] 25 (10/16 0500) BP: (90-162)/(46-137) 158/66 (10/16 0500) SpO2:  [87 %-100 %] 100 % (10/16 0500) Arterial Line BP: (78-192)/(38-72) 164/61 (10/16 0500) Last BM Date:  (PTA)  Intake/Output from previous day: 10/15 0701 - 10/16 0700 In: 4105.8 [P.O.:450; I.V.:2727.3; Blood:435.5; IV Piggyback:295] Out: 1965 [SWFUX:3235; Emesis/NG output:200; Drains:115] Intake/Output this shift: No intake/output data recorded.  Exam: Awake and alert Abdomens soft, mildly tender, JP serosan  Lab Results:  Recent Labs    02/27/20 1400 02/27/20 1400 02/27/20 2151 02/28/20 0523  WBC 5.3  --   --  5.8  HGB 7.5*   < > 7.9* 7.2*  HCT 22.3*   < > 23.8* 21.1*  PLT 76*  --   --  72*   < > = values in this interval not displayed.   BMET Recent Labs    02/27/20 0254 02/28/20 0523  NA 138 136  K 5.2* 4.4  CL 109 108  CO2 22 21*  GLUCOSE 104* 139*  BUN 31* 41*  CREATININE 2.70* 3.18*  CALCIUM 8.5* 7.9*   PT/INR Recent Labs    02/26/20 1526  LABPROT 15.8*  INR 1.3*   ABG Recent Labs    02/26/20 1224 02/26/20 1423  PHART 7.305* 7.231*  HCO3 23.6 20.4    Studies/Results: No results found.  Anti-infectives: Anti-infectives (From admission, onward)   Start     Dose/Rate Route Frequency Ordered Stop   02/26/20 1319  ceFAZolin (ANCEF) 2-4 GM/100ML-% IVPB       Note to Pharmacy: Dayle Points   : cabinet override      02/26/20 1319 02/26/20 1321   02/26/20 1200  ertapenem (INVANZ) 500 mg in sodium chloride 0.9 % 50 mL IVPB  Status:  Discontinued        500 mg 100 mL/hr over 30 Minutes Intravenous  Once 02/26/20 1146 02/26/20 1803   02/26/20 0745  ceFAZolin (ANCEF) IVPB 2g/100 mL premix        2 g 200 mL/hr over 30 Minutes  Intravenous On call to O.R. 02/26/20 0737 02/26/20 1321   02/26/20 0737  ceFAZolin (ANCEF) IVPB 2g/100 mL premix  Status:  Discontinued        2 g 200 mL/hr over 30 Minutes Intravenous 30 min pre-op 02/26/20 0737 02/26/20 0738      Assessment/Plan: s/p Procedure(s): OPEN PARTIAL HEPATECTOMY, CHOLECYSTECTOMY (N/A) OPEN RIGHT RENAL CELL METASTATECTOMY AND  ADRENALECTOMY (Right)  Advance to full liquid diet  LOS: 2 days    Antonio Adams 02/28/2020

## 2020-02-29 ENCOUNTER — Inpatient Hospital Stay (HOSPITAL_COMMUNITY): Payer: Medicare Other

## 2020-02-29 LAB — URINALYSIS, COMPLETE (UACMP) WITH MICROSCOPIC
Bacteria, UA: NONE SEEN
Bilirubin Urine: NEGATIVE
Glucose, UA: 50 mg/dL — AB
Ketones, ur: NEGATIVE mg/dL
Leukocytes,Ua: NEGATIVE
Nitrite: NEGATIVE
Protein, ur: 100 mg/dL — AB
RBC / HPF: >50 RBC/hpf — ABNORMAL HIGH (ref 0–5)
Specific Gravity, Urine: 1.012 (ref 1.005–1.030)
pH: 6 (ref 5.0–8.0)

## 2020-02-29 LAB — BASIC METABOLIC PANEL
Anion gap: 12 (ref 5–15)
BUN: 39 mg/dL — ABNORMAL HIGH (ref 8–23)
CO2: 18 mmol/L — ABNORMAL LOW (ref 22–32)
Calcium: 8.5 mg/dL — ABNORMAL LOW (ref 8.9–10.3)
Chloride: 104 mmol/L (ref 98–111)
Creatinine, Ser: 3.15 mg/dL — ABNORMAL HIGH (ref 0.61–1.24)
GFR, Estimated: 19 mL/min — ABNORMAL LOW (ref >60)
Glucose, Bld: 131 mg/dL — ABNORMAL HIGH (ref 70–99)
Potassium: 4 mmol/L (ref 3.5–5.1)
Sodium: 134 mmol/L — ABNORMAL LOW (ref 135–145)

## 2020-02-29 LAB — CBC
HCT: 20.1 % — ABNORMAL LOW (ref 39.0–52.0)
Hemoglobin: 6.7 g/dL — CL (ref 13.0–17.0)
MCH: 29.4 pg (ref 26.0–34.0)
MCHC: 33.3 g/dL (ref 30.0–36.0)
MCV: 88.2 fL (ref 80.0–100.0)
Platelets: 87 10*3/uL — ABNORMAL LOW (ref 150–400)
RBC: 2.28 MIL/uL — ABNORMAL LOW (ref 4.22–5.81)
RDW: 15.9 % — ABNORMAL HIGH (ref 11.5–15.5)
WBC: 6.2 10*3/uL (ref 4.0–10.5)
nRBC: 0 % (ref 0.0–0.2)

## 2020-02-29 LAB — GLUCOSE, CAPILLARY
Glucose-Capillary: 100 mg/dL — ABNORMAL HIGH (ref 70–99)
Glucose-Capillary: 105 mg/dL — ABNORMAL HIGH (ref 70–99)
Glucose-Capillary: 119 mg/dL — ABNORMAL HIGH (ref 70–99)
Glucose-Capillary: 139 mg/dL — ABNORMAL HIGH (ref 70–99)
Glucose-Capillary: 209 mg/dL — ABNORMAL HIGH (ref 70–99)
Glucose-Capillary: 233 mg/dL — ABNORMAL HIGH (ref 70–99)

## 2020-02-29 LAB — HEMOGLOBIN AND HEMATOCRIT, BLOOD
HCT: 29.1 % — ABNORMAL LOW (ref 39.0–52.0)
Hemoglobin: 9.8 g/dL — ABNORMAL LOW (ref 13.0–17.0)

## 2020-02-29 LAB — LACTIC ACID, PLASMA: Lactic Acid, Venous: 1.1 mmol/L (ref 0.5–1.9)

## 2020-02-29 LAB — PREPARE RBC (CROSSMATCH)

## 2020-02-29 MED ORDER — LABETALOL HCL 5 MG/ML IV SOLN
20.0000 mg | INTRAVENOUS | Status: DC | PRN
  Administered 2020-02-29 – 2020-03-01 (×2): 20 mg via INTRAVENOUS
  Filled 2020-02-29 (×3): qty 4

## 2020-02-29 MED ORDER — SODIUM CHLORIDE 0.9% IV SOLUTION: Freq: Once | INTRAVENOUS | Status: AC

## 2020-02-29 MED ORDER — HYDRALAZINE HCL 20 MG/ML IJ SOLN: 20.0000 mg | Freq: Four times a day (QID) | INTRAMUSCULAR | Status: DC | PRN

## 2020-02-29 MED ORDER — HYDRALAZINE HCL 20 MG/ML IJ SOLN
20.0000 mg | INTRAMUSCULAR | Status: DC | PRN
  Administered 2020-02-29 – 2020-03-01 (×2): 20 mg via INTRAVENOUS
  Filled 2020-02-29 (×2): qty 1

## 2020-02-29 MED ORDER — OXYCODONE HCL 5 MG PO TABS
5.0000 mg | ORAL_TABLET | ORAL | Status: DC | PRN
  Administered 2020-02-29 – 2020-03-01 (×4): 5 mg via ORAL
  Filled 2020-02-29 (×4): qty 1

## 2020-02-29 NOTE — Anesthesia Post-op Follow-up Note (Signed)
  Anesthesia Pain Follow-up Note  Patient: Antonio Adams  Day #: 3  Date of Follow-up: 02/29/2020 Time: 11:35 AM  Last Vitals:  Vitals:   02/29/20 0900 02/29/20 1000  BP: 116/60 122/60  Pulse: 87 78  Resp: 13 (!) 29  Temp:    SpO2: 100% 100%    Level of Consciousness: alert  Pain: none   Side Effects:None  Catheter Site Exam:clean  Epidural / Intrathecal (From admission, onward)   Start     Dose/Rate Route Frequency Ordered Stop   02/26/20 1015  ropivacaine (PF) 2 mg/mL (0.2%) (NAROPIN) injection        0-10 mL/hr 0-10 mL/hr  Epidural Continuous 02/26/20 1010 03/02/20 1014       Plan: Catheter removed/tip intact at surgeon's request  [place]

## 2020-02-29 NOTE — Progress Notes (Addendum)
Urology Inpatient Progress Report  Metastatic renal cell carcinoma (HCC) [C64.9]  Procedure(s): OPEN PARTIAL HEPATECTOMY, CHOLECYSTECTOMY OPEN RIGHT RENAL CELL METASTATECTOMY AND  ADRENALECTOMY  3 Days Post-Op  Intv/Subj: NAEON. AF, isolated tachycardia to 111 this morning, intermittently hypertensive to SBP 150-170s, RA.  Tolerating full liquid diet without nausea/emesis. Passing flatus. Dizzy with ambulation yesterday. Hasn't ambulated since. JP drain removed yesterday. Hgb stable on PM recheck but downtrended again this morning. Pain controlled.   Active Problems:   Metastatic renal cell carcinoma (HCC)  Current Facility-Administered Medications  Medication Dose Route Frequency Provider Last Rate Last Admin  . acetaminophen (TYLENOL) tablet 1,000 mg  1,000 mg Oral Q6H Celene Squibb, MD   1,000 mg at 02/29/20 0420  . amLODipine (NORVASC) tablet 2.5 mg  2.5 mg Oral Daily [name], MD   2.5 mg at 02/27/20 1439  . Chlorhexidine Gluconate Cloth 2 % PADS 6 each  6 each Topical Daily [name], MD   6 each at 02/28/20 1136  . diltiazem (CARDIZEM) tablet 120 mg  120 mg Oral Q6H [name], MD   120 mg at 02/29/20 0510  . docusate sodium (COLACE) capsule 100 mg  100 mg Oral BID [name], MD   100 mg at 02/28/20 2142  . hydrALAZINE (APRESOLINE) injection 10 mg  10 mg Intravenous Q6H PRN [name], MD      . HYDROmorphone (DILAUDID) injection 0.5 mg  0.5 mg Intravenous Q2H PRN [name], MD   0.5 mg at 02/28/20 2336  . insulin aspart (novoLOG) injection 0-15 Units  0-15 Units Subcutaneous Q4H [name], MD   2 Units at 02/28/20 1724  . lip balm (CARMEX) ointment   Topical PRN [name], MD      . MEDLINE mouth rinse  15 mL Mouth Rinse BID [name], MD   15 mL at 02/28/20 2142  . ondansetron (ZOFRAN) injection 4 mg  4 mg Intravenous Q6H PRN [name], MD      . ropivacaine (PF) 2 mg/mL  (0.2%) (NAROPIN) injection  0-10 mL/hr Epidural Continuous [name], MD 7 mL/hr at 02/29/20 0800 7 mL/hr at 02/29/20 0800  . tamsulosin (FLOMAX) capsule 0.4 mg  0.4 mg Oral Daily [name], MD   0.4 mg at 02/28/20 1135     Objective: Vital: Vitals:   02/29/20 0600 02/29/20 0700 02/29/20 0800 02/29/20 0849  BP: (!) 169/72 (!) 152/66 (!) 142/76   Pulse: 79 79 86   Resp: (!) 25 (!) 29 (!) 30   Temp:    98.4 F (36.9 C)  TempSrc:    Oral  SpO2: 96% 97% 99%   Weight:      Height:       I/Os: I/O last 3 completed shifts: In: 2362.8 [P.O.:500; I.V.:1396.5; Other:173.1; IV Piggyback:293.2] Out: 2855 [Urine:2825; Drains:30] UOP - 2.1L x 24 hr / 800cc since MN   Physical Exam:  General: Patient is in no apparent distress Lungs: Normal respiratory effort on [place] GI: Incisions are c/d/i.  The abdomen is soft and nontender. JP drain removed Foley: straw colored Ext: lower extremities symmetric  Lab Results: Recent Labs    02/27/20 0254 02/27/20 0254 02/27/20 1400 02/27/20 1400 02/27/20 2151 02/28/20 0523 02/28/20 1619  WBC 4.7  --  5.3  --   --  5.8  --   HGB 7.0*   < > 7.5*   < > 7.9* 7.2* 7.6*  HCT  20.5*   < > 22.3*   < > 23.8* 21.1* 22.3*   < > = values in this interval not displayed.   Recent Labs    02/26/20 1526 02/27/20 0254 02/28/20 0523  NA 139 138 136  K 5.5* 5.2* 4.4  CL 109 109 108  CO2 21* 22 21*  GLUCOSE 193* 104* 139*  BUN 23 31* 41*  CREATININE 2.09* 2.70* 3.18*  CALCIUM 9.1 8.5* 7.9*   Recent Labs    02/26/20 1526  INR 1.3*   No results for input(s): LABURIN in the last 72 hours. Results for orders placed or performed during the hospital encounter of 02/26/20  MRSA PCR Screening     Status: None   Collection Time: 02/26/20  6:34 PM   Specimen: Nasal Mucosa; Nasopharyngeal  Result Value Ref Range Status   MRSA by PCR NEGATIVE NEGATIVE Final    Comment:        The GeneXpert MRSA Assay (FDA approved for NASAL  specimens only), is one component of a comprehensive MRSA colonization surveillance program. It is not intended to diagnose MRSA infection nor to guide or monitor treatment for MRSA infections. Performed at [facility], [place] [address], [place], [place] [address]     Studies/Results: No results found.  Assessment: Procedure(s): OPEN PARTIAL HEPATECTOMY, CHOLECYSTECTOMY OPEN RIGHT RENAL CELL METASTATECTOMY AND  ADRENALECTOMY, 3 Days Post-Op, overall doing well  - Anemia from acute surgical blood loss - Pain controlled with epidural/ IV medication  Plan: - Given 1u pRBC for Hgb 6.7. Will recheck Hgb after 1u to ensure appropriate response. Hold DVT ppx until Hgb stable  - Remove epidural today and start PRN analgesics - Remove foley catheter/TOV several hours after epidural removed - Diet per Gen Surg. They recommend transitioning to low fat diet today. Bowel regimen - Ambulate. IS/SCDs - ISS - Floor status  Celene Squibb, MD Urology Resident, PGY4  02/29/2020, 8:53 AM

## 2020-02-29 NOTE — Progress Notes (Signed)
3 Days Post-Op   Subjective/Chief Complaint: Comfortable Tolerated fulls   Objective: Vital signs in last 24 hours: Temp:  [98.7 F (37.1 C)-99.6 F (37.6 C)] 99.2 F (37.3 C) (10/17 0400) Pulse Rate:  [70-111] 79 (10/17 0600) Resp:  [22-36] 25 (10/17 0600) BP: (91-180)/(44-128) 169/72 (10/17 0600) SpO2:  [89 %-100 %] 96 % (10/17 0600) Last BM Date: 02/28/20  Intake/Output from previous day: 10/16 0701 - 10/17 0700 In: 1268.6 [P.O.:500; I.V.:559.4; IV Piggyback:98.2] Out: 2100 [Urine:2100] Intake/Output this shift: No intake/output data recorded.  Exam: Awake and alert Abdomen soft, minimally tender  Lab Results:  Recent Labs    02/27/20 1400 02/27/20 2151 02/28/20 0523 02/28/20 1619  WBC 5.3  --  5.8  --   HGB 7.5*   < > 7.2* 7.6*  HCT 22.3*   < > 21.1* 22.3*  PLT 76*  --  72*  --    < > = values in this interval not displayed.   BMET Recent Labs    02/27/20 0254 02/28/20 0523  NA 138 136  K 5.2* 4.4  CL 109 108  CO2 22 21*  GLUCOSE 104* 139*  BUN 31* 41*  CREATININE 2.70* 3.18*  CALCIUM 8.5* 7.9*   PT/INR Recent Labs    02/26/20 1526  LABPROT 15.8*  INR 1.3*   ABG Recent Labs    02/26/20 1224 02/26/20 1423  PHART 7.305* 7.231*  HCO3 23.6 20.4    Studies/Results: No results found.  Anti-infectives: Anti-infectives (From admission, onward)   Start     Dose/Rate Route Frequency Ordered Stop   02/26/20 1319  ceFAZolin (ANCEF) 2-4 GM/100ML-% IVPB       Note to Pharmacy: Dayle Points   : cabinet override      02/26/20 1319 02/26/20 1321   02/26/20 1200  ertapenem (INVANZ) 500 mg in sodium chloride 0.9 % 50 mL IVPB  Status:  Discontinued        500 mg 100 mL/hr over 30 Minutes Intravenous  Once 02/26/20 1146 02/26/20 1803   02/26/20 0745  ceFAZolin (ANCEF) IVPB 2g/100 mL premix        2 g 200 mL/hr over 30 Minutes Intravenous On call to O.R. 02/26/20 0737 02/26/20 1321   02/26/20 0737  ceFAZolin (ANCEF) IVPB 2g/100 mL premix  Status:   Discontinued        2 g 200 mL/hr over 30 Minutes Intravenous 30 min pre-op 02/26/20 0737 02/26/20 0738      Assessment/Plan: s/p Procedure(s): OPEN PARTIAL HEPATECTOMY, CHOLECYSTECTOMY (N/A) OPEN RIGHT RENAL CELL METASTATECTOMY AND  ADRENALECTOMY (Right)  Advance to low fat diet  LOS: 3 days    Coralie Keens MD 02/29/2020

## 2020-02-29 NOTE — Progress Notes (Signed)
CRITICAL VALUE ALERT  Critical Value:  Hemoglobin = 6.7  Date & Time Notied:  02/29/2020 1059  Provider Notified: Dr. Sheppard Coil  Orders Received/Actions taken: Give 1 unit PRBC and recheck hemoglobin after transfusion

## 2020-03-01 LAB — TYPE AND SCREEN
ABO/RH(D): O POS
Antibody Screen: NEGATIVE
Unit division: 0
Unit division: 0
Unit division: 0
Unit division: 0
Unit division: 0
Unit division: 0
Unit division: 0
Unit division: 0

## 2020-03-01 LAB — CBC
HCT: 25.1 % — ABNORMAL LOW (ref 39.0–52.0)
Hemoglobin: 8.5 g/dL — ABNORMAL LOW (ref 13.0–17.0)
MCH: 29.5 pg (ref 26.0–34.0)
MCHC: 33.9 g/dL (ref 30.0–36.0)
MCV: 87.2 fL (ref 80.0–100.0)
Platelets: 101 10*3/uL — ABNORMAL LOW (ref 150–400)
RBC: 2.88 MIL/uL — ABNORMAL LOW (ref 4.22–5.81)
RDW: 15.5 % (ref 11.5–15.5)
WBC: 6.4 10*3/uL (ref 4.0–10.5)
nRBC: 0.3 % — ABNORMAL HIGH (ref 0.0–0.2)

## 2020-03-01 LAB — BPAM RBC
Blood Product Expiration Date: 202111092359
Blood Product Expiration Date: 202111092359
Blood Product Expiration Date: 202111102359
Blood Product Expiration Date: 202111102359
Blood Product Expiration Date: 202111102359
Blood Product Expiration Date: 202111102359
Blood Product Expiration Date: 202111102359
Blood Product Expiration Date: 202111112359
ISSUE DATE / TIME: 202110141234
ISSUE DATE / TIME: 202110141234
ISSUE DATE / TIME: 202110141325
ISSUE DATE / TIME: 202110141325
ISSUE DATE / TIME: 202110150825
ISSUE DATE / TIME: 202110171450
ISSUE DATE / TIME: 202110181015
Unit Type and Rh: 5100
Unit Type and Rh: 5100
Unit Type and Rh: 5100
Unit Type and Rh: 5100
Unit Type and Rh: 5100
Unit Type and Rh: 5100
Unit Type and Rh: 5100
Unit Type and Rh: 5100

## 2020-03-01 LAB — GLUCOSE, CAPILLARY
Glucose-Capillary: 136 mg/dL — ABNORMAL HIGH (ref 70–99)
Glucose-Capillary: 158 mg/dL — ABNORMAL HIGH (ref 70–99)
Glucose-Capillary: 143 mg/dL — ABNORMAL HIGH (ref 70–99)
Glucose-Capillary: 176 mg/dL — ABNORMAL HIGH (ref 70–99)
Glucose-Capillary: 138 mg/dL — ABNORMAL HIGH (ref 70–99)

## 2020-03-01 LAB — BASIC METABOLIC PANEL
Anion gap: 10 (ref 5–15)
BUN: 37 mg/dL — ABNORMAL HIGH (ref 8–23)
CO2: 20 mmol/L — ABNORMAL LOW (ref 22–32)
Calcium: 8.5 mg/dL — ABNORMAL LOW (ref 8.9–10.3)
Chloride: 104 mmol/L (ref 98–111)
Creatinine, Ser: 3.09 mg/dL — ABNORMAL HIGH (ref 0.61–1.24)
GFR, Estimated: 19 mL/min — ABNORMAL LOW (ref >60)
Glucose, Bld: 136 mg/dL — ABNORMAL HIGH (ref 70–99)
Potassium: 3.8 mmol/L (ref 3.5–5.1)
Sodium: 134 mmol/L — ABNORMAL LOW (ref 135–145)

## 2020-03-01 LAB — SURGICAL PATHOLOGY

## 2020-03-01 MED ORDER — AMLODIPINE BESYLATE 5 MG PO TABS
5.0000 mg | ORAL_TABLET | Freq: Every day | ORAL | Status: DC
  Administered 2020-03-01 – 2020-03-03 (×3): 5 mg via ORAL
  Filled 2020-03-01 (×3): qty 1

## 2020-03-01 MED ORDER — HYDRALAZINE HCL 20 MG/ML IJ SOLN: 20.0000 mg | INTRAMUSCULAR | Status: DC | PRN

## 2020-03-01 MED ORDER — HEPARIN SODIUM (PORCINE) 5000 UNIT/ML IJ SOLN
5000.0000 [IU] | Freq: Three times a day (TID) | INTRAMUSCULAR | Status: DC
  Administered 2020-03-01 – 2020-03-03 (×7): 5000 [IU] via SUBCUTANEOUS
  Filled 2020-03-01 (×7): qty 1

## 2020-03-01 MED ORDER — POLYETHYLENE GLYCOL 3350 17 G PO PACK
17.0000 g | PACK | Freq: Every day | ORAL | Status: DC | PRN
  Administered 2020-03-01: 17 g via ORAL
  Filled 2020-03-01: qty 1

## 2020-03-01 MED ORDER — ACETAMINOPHEN 10 MG/ML IV SOLN
1000.0000 mg | Freq: Four times a day (QID) | INTRAVENOUS | Status: AC
  Administered 2020-03-01 – 2020-03-02 (×4): 1000 mg via INTRAVENOUS
  Filled 2020-03-01 (×5): qty 100

## 2020-03-01 NOTE — Evaluation (Signed)
Physical Therapy Evaluation Patient Details Name: Antonio Adams MRN: [id number] DOB: 01/07/1946 Today's Date: 03/01/2020   History of Present Illness  Antonio Adams  has presented 02/26/20  for surgery, with the diagnosis of RECURRENT RENAL CELL CANCER, GASLLSTONES. S/P OPEN PARTIAL HEPATECTOMY, CHOLECYSTECTOMYOPEN RIGHT RENAL CELL METASTATECTOMY AND  ADRENALECTOMY   PMH: multiple back surgeries, Depression, glaucoma, sleep apnea, S/P partial nephrectomy, right.  Clinical Impression    the patient is eager to mobilize. Assisted with 1 from bed to BSC(CNA) then to recliner. Patient reports right upper chest discomfort. Patient should progress to Dc home. Patient does live alone and 2 sons are not local, has a sisiter locally.  Pt admitted with above diagnosis.  Pt currently with functional limitations due to the deficits listed below (see PT Problem List). Pt will benefit from skilled PT to increase their independence and safety with mobility to allow discharge to the venue listed below.     Follow Up Recommendations Home health PT    Equipment Recommendations   (tba)    Recommendations for Other Services       Precautions / Restrictions Precautions Precautions: Fall Precaution Comments: right abd incision      Mobility  Bed Mobility               General bed mobility comments: on BSC  Transfers Overall transfer level: Needs assistance Equipment used: 1 person hand held assist (bed rail) Transfers: Sit to/from Stand;Stand Pivot Transfers Sit to Stand: Min assist Stand pivot transfers: Min assist       General transfer comment: patient held onto rail tostand from [facility], side steps with HHA x 5 to recliner. Declined feeling up to ambulation at this time  Ambulation/Gait             General Gait Details: tba  Stairs            Wheelchair Mobility    Modified Rankin (Stroke Patients Only)       Balance Overall balance assessment: Needs  assistance Sitting-balance support: Feet supported;No upper extremity supported Sitting balance-Leahy Scale: Good     Standing balance support: During functional activity;Single extremity supported Standing balance-Leahy Scale: Fair                               Pertinent Vitals/Pain Pain Assessment: Faces Faces Pain Scale: Hurts little more Pain Location: right upper chest Pain Descriptors / Indicators: Discomfort;Grimacing;Guarding Pain Intervention(s): Monitored during session;Heat applied    Home Living Family/patient expects to be discharged to:: Private residence Living Arrangements: Alone Available Help at Discharge: Family Type of Home: House Home Access: Stairs to enter Entrance Stairs-Rails: Right Entrance Stairs-Number of Steps: 4 Home Layout: Able to live on main level with bedroom/bathroom Home Equipment: Kasandra Knudsen - single point Additional Comments: 2 sons live Out of town, has a sister who may be able to help    Prior Function Level of Independence: Independent               Hand Dominance        Extremity/Trunk Assessment   Upper Extremity Assessment Upper Extremity Assessment: Generalized weakness    Lower Extremity Assessment Lower Extremity Assessment: Generalized weakness    Cervical / Trunk Assessment Cervical / Trunk Assessment: Normal  Communication   Communication: No difficulties  Cognition Arousal/Alertness: Awake/alert Behavior During Therapy: WFL for tasks assessed/performed Overall Cognitive Status: Within Functional Limits for tasks assessed  General Comments      Exercises     Assessment/Plan    PT Assessment Patient needs continued PT services  PT Problem List Decreased strength;Decreased range of motion;Decreased activity tolerance;Decreased mobility;Pain       PT Treatment Interventions DME instruction;Gait training;Functional mobility  training;Patient/family education;Therapeutic activities;Therapeutic exercise    PT Goals (Current goals can be found in the Care Plan section)  Acute Rehab PT Goals Patient Stated Goal: to get stronger, exercise, go home PT Goal Formulation: With patient Time For Goal Achievement: 03/15/20 Potential to Achieve Goals: Good    Frequency Min 3X/week   Barriers to discharge Decreased caregiver support      Co-evaluation               AM-PAC PT "6 Clicks" Mobility  Outcome Measure Help needed turning from your back to your side while in a flat bed without using bedrails?: A Little Help needed moving from lying on your back to sitting on the side of a flat bed without using bedrails?: A Little Help needed moving to and from a bed to a chair (including a wheelchair)?: A Little Help needed standing up from a chair using your arms (e.g., wheelchair or bedside chair)?: A Little Help needed to walk in hospital room?: A Lot Help needed climbing 3-5 steps with a railing? : A Lot 6 Click Score: 16    End of Session   Activity Tolerance: Patient limited by fatigue;Patient limited by pain Patient left: in chair;with call bell/phone within reach Nurse Communication: Mobility status PT Visit Diagnosis: Unsteadiness on feet (R26.81);Difficulty in walking, not elsewhere classified (R26.2);Pain Pain - Right/Left: Right    Time: 1110-1130 PT Time Calculation (min) (ACUTE ONLY): 20 min   Charges:   PT Evaluation $PT Eval Low Complexity: [facility] PT Acute Rehabilitation Services Pager [phone] Office [phone]   Claretha Cooper 03/01/2020, 12:32 PM

## 2020-03-01 NOTE — Progress Notes (Signed)
Urology Inpatient Progress Report  Metastatic renal cell carcinoma (HCC) [C64.9]  Procedure(s): OPEN PARTIAL HEPATECTOMY, CHOLECYSTECTOMY OPEN RIGHT RENAL CELL METASTATECTOMY AND  ADRENALECTOMY  4 Days Post-Op  Intv/Subj: No acute events - Feels strong, but sore Epidural removed - pain intermittently controlled on IV/oral pain medication Foley removed, voiding no his own Tolerating diet, passing flatus Has not really ambulated much since surgery.   Low grade fever - CXR show low lung volumes,  UA unremarkable - consistent with recent foley/GU surgery   Active Problems:   Metastatic renal cell carcinoma (HCC)  Current Facility-Administered Medications  Medication Dose Route Frequency Provider Last Rate Last Admin  . acetaminophen (OFIRMEV) IV 1,000 mg  1,000 mg Intravenous Q6H [name], MD      . amLODipine (NORVASC) tablet 5 mg  5 mg Oral Daily [name], MD      . Chlorhexidine Gluconate Cloth 2 % PADS 6 each  6 each Topical Daily [name], MD   6 each at 02/29/20 1300  . diltiazem (CARDIZEM) tablet 120 mg  120 mg Oral Q6H [name], MD   120 mg at 03/01/20 3299  . docusate sodium (COLACE) capsule 100 mg  100 mg Oral BID [name], MD   100 mg at 02/28/20 2142  . heparin injection 5,000 Units  5,000 Units Subcutaneous Q8H [name], MD      . hydrALAZINE (APRESOLINE) injection 20 mg  20 mg Intravenous Q4H PRN Celene Squibb, MD   20 mg at 02/29/20 2037  . HYDROmorphone (DILAUDID) injection 0.5 mg  0.5 mg Intravenous Q2H PRN [name], MD   0.5 mg at 02/29/20 2353  . insulin aspart (novoLOG) injection 0-15 Units  0-15 Units Subcutaneous Q4H [name], MD   2 Units at 03/01/20 0524  . labetalol (NORMODYNE) injection 20 mg  20 mg Intravenous Q2H PRN Celene Squibb, MD   20 mg at 02/29/20 1848  . lip balm (CARMEX) ointment   Topical PRN [name], MD      . MEDLINE mouth rinse  15 mL  Mouth Rinse BID [name], MD   15 mL at 02/29/20 2249  . ondansetron (ZOFRAN) injection 4 mg  4 mg Intravenous Q6H PRN [name], MD      . oxyCODONE (Oxy IR/ROXICODONE) immediate release tablet 5 mg  5 mg Oral Q4H PRN Celene Squibb, MD   5 mg at 03/01/20 0509  . ropivacaine (PF) 2 mg/mL (0.2%) (NAROPIN) injection  0-10 mL/hr Epidural Continuous [name], MD   Stopped at 02/29/20 1409  . tamsulosin (FLOMAX) capsule 0.4 mg  0.4 mg Oral Daily [name], MD   0.4 mg at 02/29/20 1132     Objective: Vital: Vitals:   03/01/20 0200 03/01/20 0300 03/01/20 0400 03/01/20 0508  BP: (!) 142/75  (!) 157/77 (!) 157/77  Pulse: 84 86 75   Resp: (!) 24 (!) 23 19   Temp:      TempSrc:      SpO2: 99% 99% 100%   Weight:      Height:       I/Os: I/O last 3 completed shifts: In: 1958.1 [P.O.:740; I.V.:559.4; Blood:315; Other:245.5; IV Piggyback:98.2] Out: 2910 [Urine:2910] UOP - 2.1L x 24 hr / 800cc since MN   Physical Exam:  General: Patient is in no apparent distress Lungs: shallow breaths, BS consistent with atelectasis in both basis GI: Incisions are c/d/i.  The abdomen  is soft and nontender. JP drain removed Foley: out Ext: lower extremities symmetric  Lab Results: Recent Labs    02/28/20 0523 02/28/20 1619 02/29/20 0946 02/29/20 1946 03/01/20 0316  WBC 5.8  --  6.2  --  6.4  HGB 7.2*   < > 6.7* 9.8* 8.5*  HCT 21.1*   < > 20.1* 29.1* 25.1*   < > = values in this interval not displayed.   Recent Labs    02/28/20 0523 02/29/20 0946 03/01/20 0316  NA 136 134* 134*  K 4.4 4.0 3.8  CL 108 104 104  CO2 21* 18* 20*  GLUCOSE 139* 131* 136*  BUN 41* 39* 37*  CREATININE 3.18* 3.15* 3.09*  CALCIUM 7.9* 8.5* 8.5*   No results for input(s): LABPT, INR in the last 72 hours. No results for input(s): LABURIN in the last 72 hours. Results for orders placed or performed during the hospital encounter of 02/26/20  MRSA PCR Screening      Status: None   Collection Time: 02/26/20  6:34 PM   Specimen: Nasal Mucosa; Nasopharyngeal  Result Value Ref Range Status   MRSA by PCR NEGATIVE NEGATIVE Final    Comment:        The GeneXpert MRSA Assay (FDA approved for NASAL specimens only), is one component of a comprehensive MRSA colonization surveillance program. It is not intended to diagnose MRSA infection nor to guide or monitor treatment for MRSA infections. Performed at [facility], [place] [address], [place], [place] [address]   Culture, blood (Routine X 2) w Reflex to ID Panel     Status: None (Preliminary result)   Collection Time: 02/29/20  9:46 PM   Specimen: Right Antecubital; Blood  Result Value Ref Range Status   Specimen Description   Final    RIGHT ANTECUBITAL BLOOD Performed at [facility] [address] [place], [place] [address]    Special Requests   Final    BOTTLES DRAWN AEROBIC ONLY Blood Culture results may not be optimal due to an inadequate volume of blood received in culture bottles Performed at [facility] [address], [place], [place] [address]    Culture PENDING  Incomplete   Report Status PENDING  Incomplete    Studies/Results: DG CHEST PORT 1 VIEW  Result Date: 02/29/2020 CLINICAL DATA:  74 year old male with partial hepatectomy and cholecystectomy. Fever and shortness of breath. EXAM: PORTABLE CHEST 1 VIEW COMPARISON:  Chest radiograph dated 11/11/2019. FINDINGS: There is shallow inspiration with bibasilar atelectasis. No focal consolidation, pleural effusion or pneumothorax. There is mild cardiomegaly with mild central vascular congestion. No acute osseous pathology. Lower cervical ACDF. IMPRESSION: No focal consolidation. Electronically Signed   By: Anner Crete M.D.   On: 02/29/2020 20:59    Assessment: Procedure(s): OPEN PARTIAL HEPATECTOMY, CHOLECYSTECTOMY OPEN RIGHT RENAL CELL METASTATECTOMY AND  ADRENALECTOMY, 4 Days Post-Op,  overall doing well  - Anemia from acute surgical blood loss - Pain controlled with oral/IV medication - HTN - hypervolemia/pain/ essential HTN - DM  - Low grade fever consistent with atelectasis thus far. - acute on chronic renal failure  Plan: - Restarted IV Tylenol for better pain control, continue with PRN Dilaudid and Oxycodone - Increase norvasc to 5mg  dialy, continue home dose of dilt.  Will allow to diurese on his own - Low fat diet - ISS/SQH - Floor status - PT eval  Home likely within 48 hours.  Will need home RN for wound checks (lives alone) -  [facility] needs and awaiting contact regarding discharge plans.  03/01/2020, 6:49 AM

## 2020-03-01 NOTE — TOC Progression Note (Signed)
Transition of Care [facility]) - Progression Note    Patient Details  Name: Antonio Adams MRN: [id number] Date of Birth: 19-Jul-1945  Transition of Care [facility] CM/SW Contact  Leeroy Cha, RN Phone Number: 03/01/2020, 8:00 AM  Clinical Narrative:    Metastatic renal cell carcinoma ([place]) [C64.9]  Procedure(s): OPEN PARTIAL HEPATECTOMY, CHOLECYSTECTOMY OPEN RIGHT RENAL CELL METASTATECTOMY AND  ADRENALECTOMY  4 Days Post-Op  Intv/Subj: No acute events - Feels strong, but sore Epidural removed - pain intermittently controlled on IV/oral pain medication Foley removed, voiding no his own Tolerating diet, passing flatus Has not really ambulated much since surgery.   Low grade fever - CXR show low lung volumes,  UA unremarkable - consistent with recent foley/GU surgery o2 [place] at 2l/min, temp=100.7, iv tylenol 1000mg  q6hrs, bld culture drawn am of [id number]. Following for rpogression Plan-home with slef care vs home with hospice.   Expected Discharge Plan: Home/Self Care Barriers to Discharge: Barriers Unresolved (comment) (pod 1)  Expected Discharge Plan and Services Expected Discharge Plan: Home/Self Care   Discharge Planning Services: CM Consult   Living arrangements for the past 2 months: Single Family Home                                       Social Determinants of Health (SDOH) Interventions    Readmission Risk Interventions No flowsheet data found.

## 2020-03-01 NOTE — Evaluation (Signed)
Occupational Therapy Evaluation Patient Details Name: JOSHUAJAMES MOEHRING MRN: [id number] DOB: February 07, 1946 Today's Date: 03/01/2020    History of Present Illness JEANETTE MOFFATT  has presented 02/26/20  for surgery, with the diagnosis of RECURRENT RENAL CELL CANCER, GASLLSTONES. S/P OPEN PARTIAL HEPATECTOMY, CHOLECYSTECTOMYOPEN RIGHT RENAL CELL METASTATECTOMY AND  ADRENALECTOMY   Clinical Impression   This 74 yo male admitted and underwent above presents to acute OT with PLOF of living alone, doing all of his basic and IADLs. He currently is mod A -setup/S for basic ADLs mainly due to pain. He will benefit from acute OT with follow up [place].    Follow Up Recommendations  Home health OT (a few days of 24/7 S/prn A)    Equipment Recommendations  None recommended by OT       Precautions / Restrictions Precautions Precautions: Fall Precaution Comments: right abd incision Restrictions Weight Bearing Restrictions: No      Mobility Bed Mobility               General bed mobility comments: Pt on BSC upon entering  Transfers Overall transfer level: Needs assistance Equipment used: 1 person hand held assist Transfers: Sit to/from [facility] Sit to Stand: Min assist Stand pivot transfers: Min assist (3n1 to recliner)       General transfer comment: patient held onto rail tostand from [facility], side steps with HHA x 5 to recliner. Declined feeling up to ambulation at this time    Balance Overall balance assessment: Needs assistance Sitting-balance support: No upper extremity supported;Feet supported Sitting balance-Leahy Scale: Good     Standing balance support: During functional activity;Single extremity supported Standing balance-Leahy Scale: Poor                             ADL either performed or assessed with clinical judgement   ADL Overall ADL's : Needs assistance/impaired Eating/Feeding: Independent;Sitting   Grooming: Set  up;Sitting   Upper Body Bathing: Set up;Sitting   Lower Body Bathing: Minimal assistance;Sit to/from stand   Upper Body Dressing : Set up;Sitting   Lower Body Dressing: Moderate assistance Lower Body Dressing Details (indicate cue type and reason): Min A sit<>stand; pt can cross his legs to get to his heels, but not his toes Toilet Transfer: Minimal assistance;Stand-pivot;BSC Toilet Transfer Details (indicate cue type and reason): No AD Toileting- Clothing Manipulation and Hygiene: Minimal assistance;Sit to/from stand     Tub/Shower Transfer Details (indicate cue type and reason): We discussed him using the 3n1 he has at home as a shower seat         Vision Baseline Vision/History: Wears glasses Wears Glasses: At all times Patient Visual Report: No change from baseline              Pertinent Vitals/Pain Pain Assessment: 0-10 Pain Score: 3  Faces Pain Scale: Hurts little more Pain Location: incisional pain Pain Descriptors / Indicators: Discomfort;Guarding Pain Intervention(s): Limited activity within patient's tolerance;Monitored during session     Hand Dominance Right   Extremity/Trunk Assessment Upper Extremity Assessment Upper Extremity Assessment: Overall WFL for tasks assessed     Communication Communication Communication: No difficulties   Cognition Arousal/Alertness: Awake/alert Behavior During Therapy: WFL for tasks assessed/performed Overall Cognitive Status: Within Functional Limits for tasks assessed  Home Living Family/patient expects to be discharged to:: Private residence Living Arrangements: Alone Available Help at Discharge: Family Type of Home: House Home Access: Stairs to enter Technical brewer of Steps: 4 Entrance Stairs-Rails: Right Home Layout: Able to live on main level with bedroom/bathroom     Bathroom Shower/Tub: Occupational psychologist: [place]: Cane - single point;Hand held shower head;Bedside commode   Additional Comments: 2 sons live Out of town, has a sister who may be able to help      Prior Functioning/Environment Level of Independence: Independent                 OT Problem List: Decreased range of motion;Impaired balance (sitting and/or standing);Pain      OT Treatment/Interventions: Self-care/ADL training;DME and/or AE instruction;Patient/family education;Balance training    OT Goals(Current goals can be found in the care plan section) Acute Rehab OT Goals Patient Stated Goal: to go home OT Goal Formulation: With patient Time For Goal Achievement: 03/15/20 Potential to Achieve Goals: Good  OT Frequency: Min 2X/week              AM-PAC OT "6 Clicks" Daily Activity     Outcome Measure Help from another person eating meals?: None Help from another person taking care of personal grooming?: A Little Help from another person toileting, which includes using toliet, bedpan, or urinal?: A Little Help from another person bathing (including washing, rinsing, drying)?: A Little Help from another person to put on and taking off regular upper body clothing?: A Little Help from another person to put on and taking off regular lower body clothing?: A Lot 6 Click Score: 18   End of Session    Activity Tolerance: Patient tolerated treatment well Patient left: in chair;with call bell/phone within reach (no alarm in recliner upon entry)  OT Visit Diagnosis: Unsteadiness on feet (R26.81);Other abnormalities of gait and mobility (R26.89);Pain Pain - Right/Left: Right Pain - part of body:  (side)                Time: 9842-1031 OT Time Calculation (min): 22 min Charges:  OT General Charges $OT Visit: 1 Visit OT Treatments $Self Care/Home Management : 8-22 mins  [place], OTR/L Acute [employer] Pager [phone] Office [phone]     Almon Register 03/01/2020, 4:03  PM

## 2020-03-02 LAB — CBC
HCT: 26.4 % — ABNORMAL LOW (ref 39.0–52.0)
Hemoglobin: 9.1 g/dL — ABNORMAL LOW (ref 13.0–17.0)
MCH: 30 pg (ref 26.0–34.0)
MCHC: 34.5 g/dL (ref 30.0–36.0)
MCV: 87.1 fL (ref 80.0–100.0)
Platelets: 127 10*3/uL — ABNORMAL LOW (ref 150–400)
RBC: 3.03 MIL/uL — ABNORMAL LOW (ref 4.22–5.81)
RDW: 15.7 % — ABNORMAL HIGH (ref 11.5–15.5)
WBC: 5.7 10*3/uL (ref 4.0–10.5)
nRBC: 0.4 % — ABNORMAL HIGH (ref 0.0–0.2)

## 2020-03-02 LAB — BASIC METABOLIC PANEL
Anion gap: 12 (ref 5–15)
BUN: 39 mg/dL — ABNORMAL HIGH (ref 8–23)
CO2: 19 mmol/L — ABNORMAL LOW (ref 22–32)
Calcium: 9.4 mg/dL (ref 8.9–10.3)
Chloride: 105 mmol/L (ref 98–111)
Creatinine, Ser: 2.74 mg/dL — ABNORMAL HIGH (ref 0.61–1.24)
GFR, Estimated: 22 mL/min — ABNORMAL LOW (ref >60)
Glucose, Bld: 128 mg/dL — ABNORMAL HIGH (ref 70–99)
Potassium: 3.9 mmol/L (ref 3.5–5.1)
Sodium: 136 mmol/L (ref 135–145)

## 2020-03-02 LAB — URINE CULTURE: Culture: NO GROWTH

## 2020-03-02 LAB — GLUCOSE, CAPILLARY
Glucose-Capillary: 110 mg/dL — ABNORMAL HIGH (ref 70–99)
Glucose-Capillary: 115 mg/dL — ABNORMAL HIGH (ref 70–99)
Glucose-Capillary: 132 mg/dL — ABNORMAL HIGH (ref 70–99)
Glucose-Capillary: 144 mg/dL — ABNORMAL HIGH (ref 70–99)
Glucose-Capillary: 169 mg/dL — ABNORMAL HIGH (ref 70–99)
Glucose-Capillary: 220 mg/dL — ABNORMAL HIGH (ref 70–99)

## 2020-03-02 MED ORDER — GEMFIBROZIL 600 MG PO TABS
600.0000 mg | ORAL_TABLET | Freq: Two times a day (BID) | ORAL | Status: DC
  Administered 2020-03-02 – 2020-03-03 (×3): 600 mg via ORAL
  Filled 2020-03-02 (×4): qty 1

## 2020-03-02 MED ORDER — ATORVASTATIN CALCIUM 40 MG PO TABS
40.0000 mg | ORAL_TABLET | Freq: Every day | ORAL | Status: DC
  Administered 2020-03-02 – 2020-03-03 (×2): 40 mg via ORAL
  Filled 2020-03-02 (×2): qty 1

## 2020-03-02 MED ORDER — FERROUS SULFATE 325 (65 FE) MG PO TABS
325.0000 mg | ORAL_TABLET | Freq: Every day | ORAL | Status: DC
  Administered 2020-03-02 – 2020-03-03 (×2): 325 mg via ORAL
  Filled 2020-03-02 (×2): qty 1

## 2020-03-02 MED ORDER — ACETAMINOPHEN 500 MG PO TABS
1000.0000 mg | ORAL_TABLET | Freq: Four times a day (QID) | ORAL | Status: DC
  Administered 2020-03-02 – 2020-03-03 (×5): 1000 mg via ORAL
  Filled 2020-03-02 (×5): qty 2

## 2020-03-02 MED ORDER — TRAMADOL HCL 50 MG PO TABS
50.0000 mg | ORAL_TABLET | Freq: Four times a day (QID) | ORAL | Status: DC | PRN
  Administered 2020-03-02 – 2020-03-03 (×2): 100 mg via ORAL
  Filled 2020-03-02 (×2): qty 2

## 2020-03-02 MED ORDER — INDAPAMIDE 1.25 MG PO TABS
1.2500 mg | ORAL_TABLET | Freq: Every day | ORAL | Status: DC
  Administered 2020-03-02 – 2020-03-03 (×2): 1.25 mg via ORAL
  Filled 2020-03-02 (×2): qty 1

## 2020-03-02 MED ORDER — GABAPENTIN 100 MG PO CAPS
100.0000 mg | ORAL_CAPSULE | Freq: Every day | ORAL | Status: DC
  Administered 2020-03-02: 21:00:00 100 mg via ORAL
  Filled 2020-03-02: qty 1

## 2020-03-02 NOTE — Progress Notes (Signed)
Delayed entry  5 Days Post-Op   Subjective/Chief Complaint: Did not have a lot of breakfast, but no n/v.  Passing gas.  Trying to have BM.   Objective: Vital signs in last 24 hours: Temp:  [98.2 F (36.8 C)-99.6 F (37.6 C)] 99.2 F (37.3 C) (10/19 0409) Pulse Rate:  [80-114] 100 (10/19 0409) Resp:  [18-25] 19 (10/19 0409) BP: (144-182)/(72-120) 147/96 (10/19 0409) SpO2:  [94 %-99 %] 94 % (10/19 0409) Last BM Date: 02/29/20  Intake/Output from previous day: 10/18 0701 - 10/19 0700 In: 900 [P.O.:600; IV Piggyback:300] Out: 1000 [Urine:1000] Intake/Output this shift: Total I/O In: 240 [P.O.:240] Out: -   Exam: Awake and alert Breathing comfortably Abdomen soft, minimally tender Incision c/d/i.  Lab Results:  Recent Labs    03/01/20 0316 03/02/20 0444  WBC 6.4 5.7  HGB 8.5* 9.1*  HCT 25.1* 26.4*  PLT 101* 127*   BMET Recent Labs    03/01/20 0316 03/02/20 0444  NA 134* 136  K 3.8 3.9  CL 104 105  CO2 20* 19*  GLUCOSE 136* 128*  BUN 37* 39*  CREATININE 3.09* 2.74*  CALCIUM 8.5* 9.4   PT/INR No results for input(s): LABPROT, INR in the last 72 hours. ABG No results for input(s): PHART, HCO3 in the last 72 hours.  Invalid input(s): PCO2, PO2  Studies/Results: DG CHEST PORT 1 VIEW  Result Date: 02/29/2020 CLINICAL DATA:  74 year old male with partial hepatectomy and cholecystectomy. Fever and shortness of breath. EXAM: PORTABLE CHEST 1 VIEW COMPARISON:  Chest radiograph dated 11/11/2019. FINDINGS: There is shallow inspiration with bibasilar atelectasis. No focal consolidation, pleural effusion or pneumothorax. There is mild cardiomegaly with mild central vascular congestion. No acute osseous pathology. Lower cervical ACDF. IMPRESSION: No focal consolidation. Electronically Signed   By: Anner Crete M.D.   On: 02/29/2020 20:59    Anti-infectives: Anti-infectives (From admission, onward)   Start     Dose/Rate Route Frequency Ordered Stop    02/26/20 1319  ceFAZolin (ANCEF) 2-4 GM/100ML-% IVPB       Note to Pharmacy: [place]   : cabinet override      02/26/20 1319 02/26/20 1321   02/26/20 1200  ertapenem (INVANZ) 500 mg in sodium chloride 0.9 % 50 mL IVPB  Status:  Discontinued        500 mg 100 mL/hr over 30 Minutes Intravenous  Once 02/26/20 1146 02/26/20 1803   02/26/20 0745  ceFAZolin (ANCEF) IVPB 2g/100 mL premix        2 g 200 mL/hr over 30 Minutes Intravenous On call to O.R. 02/26/20 0737 02/26/20 1321   02/26/20 0737  ceFAZolin (ANCEF) IVPB 2g/100 mL premix  Status:  Discontinued        2 g 200 mL/hr over 30 Minutes Intravenous 30 min pre-op 02/26/20 0737 02/26/20 0738      Assessment/Plan: s/p Procedure(s): OPEN PARTIAL HEPATECTOMY, CHOLECYSTECTOMY (N/A) OPEN RIGHT RENAL CELL METASTATECTOMY AND  ADRENALECTOMY (Right)  Diet as tolerated.   Cr coming back down. Urology plans d/c in a few days.     LOS: 5 days    Stark Klein MD 03/02/2020

## 2020-03-02 NOTE — Progress Notes (Signed)
Urology Inpatient Progress Report  Metastatic renal cell carcinoma (HCC) [C64.9]  Procedure(s): OPEN PARTIAL HEPATECTOMY, CHOLECYSTECTOMY OPEN RIGHT RENAL CELL METASTATECTOMY AND  ADRENALECTOMY  5 Days Post-Op  Intv/Subj: NAEON Pain controlled No complaints   Active Problems:   Metastatic renal cell carcinoma (HCC)  Current Facility-Administered Medications  Medication Dose Route Frequency Provider Last Rate Last Admin  . acetaminophen (TYLENOL) tablet 1,000 mg  1,000 mg Oral Q6H [name], MD      . amLODipine (NORVASC) tablet 5 mg  5 mg Oral Daily [name], MD   5 mg at 03/01/20 1019  . atorvastatin (LIPITOR) tablet 40 mg  40 mg Oral Daily [name], MD      . Chlorhexidine Gluconate Cloth 2 % PADS 6 each  6 each Topical Daily [name], MD   6 each at 03/01/20 1459  . diltiazem (CARDIZEM) tablet 120 mg  120 mg Oral Q6H [name], MD   120 mg at 03/02/20 0514  . docusate sodium (COLACE) capsule 100 mg  100 mg Oral BID [name], MD   100 mg at 03/01/20 2129  . ferrous sulfate tablet 325 mg  325 mg Oral Q breakfast [name], MD      . gabapentin (NEURONTIN) capsule 100 mg  100 mg Oral QHS [name], MD      . gemfibrozil (LOPID) tablet 600 mg  600 mg Oral BID AC [name], MD      . heparin injection 5,000 Units  5,000 Units Subcutaneous Q8H [name], MD   5,000 Units at 03/02/20 0514  . hydrALAZINE (APRESOLINE) injection 20 mg  20 mg Intravenous Q4H PRN Celene Squibb, MD      . HYDROmorphone (DILAUDID) injection 0.5 mg  0.5 mg Intravenous Q2H PRN [name], MD   0.5 mg at 03/02/20 0415  . indapamide (LOZOL) tablet 1.25 mg  1.25 mg Oral Daily [name], MD      . insulin aspart (novoLOG) injection 0-15 Units  0-15 Units Subcutaneous Q4H [name], MD   2 Units at 03/02/20 0017  . labetalol (NORMODYNE) injection 20 mg  20 mg Intravenous Q2H PRN  Celene Squibb, MD   20 mg at 03/01/20 1604  . lip balm (CARMEX) ointment   Topical PRN [name], MD      . MEDLINE mouth rinse  15 mL Mouth Rinse BID [name], MD   15 mL at 03/01/20 2130  . ondansetron (ZOFRAN) injection 4 mg  4 mg Intravenous Q6H PRN [name], MD      . polyethylene glycol (MIRALAX / GLYCOLAX) packet 17 g  17 g Oral Daily PRN Celene Squibb, MD   17 g at 03/01/20 1236  . tamsulosin (FLOMAX) capsule 0.4 mg  0.4 mg Oral Daily [name], MD   0.4 mg at 03/01/20 1019  . traMADol (ULTRAM) tablet 50-100 mg  50-100 mg Oral Q6H PRN [name], MD         Objective: Vital: Vitals:   03/01/20 1657 03/01/20 2003 03/02/20 0152 03/02/20 0409  BP: (!) 144/72 (!) 156/77 (!) 166/87 (!) 147/96  Pulse: 80 85 98 100  Resp: (!) 22 19 18 19   Temp: 98.2 F (36.8 C) 98.7 F (37.1 C) 99.6 F (37.6 C) 99.2 F (37.3 C)  TempSrc: Oral Oral Oral Oral  SpO2: 99% 96% 94% 94%  Weight:  Height:       I/Os: I/O last 3 completed shifts: In: 1031.6 [P.O.:480; Blood:315; Other:134.6; IV Piggyback:102] Out: 2370 [Urine:2370] UOP - 2.1L x 24 hr / 800cc since MN   Physical Exam:  General: Patient is in no apparent distress Lungs: shallow breaths, BS consistent with atelectasis in both basis GI: Incisions are c/d/i.  The abdomen is soft and nontender. JP drain removed Foley: out Ext: lower extremities symmetric, pitting edema to shins  Lab Results: Recent Labs    02/29/20 0946 02/29/20 0946 02/29/20 1946 03/01/20 0316 03/02/20 0444  WBC 6.2  --   --  6.4 5.7  HGB 6.7*   < > 9.8* 8.5* 9.1*  HCT 20.1*   < > 29.1* 25.1* 26.4*   < > = values in this interval not displayed.   Recent Labs    02/29/20 0946 03/01/20 0316 03/02/20 0444  NA 134* 134* 136  K 4.0 3.8 3.9  CL 104 104 105  CO2 18* 20* 19*  GLUCOSE 131* 136* 128*  BUN 39* 37* 39*  CREATININE 3.15* 3.09* 2.74*  CALCIUM 8.5* 8.5* 9.4   No results for  input(s): LABPT, INR in the last 72 hours. No results for input(s): LABURIN in the last 72 hours. Results for orders placed or performed during the hospital encounter of 02/26/20  MRSA PCR Screening     Status: None   Collection Time: 02/26/20  6:34 PM   Specimen: Nasal Mucosa; Nasopharyngeal  Result Value Ref Range Status   MRSA by PCR NEGATIVE NEGATIVE Final    Comment:        The GeneXpert MRSA Assay (FDA approved for NASAL specimens only), is one component of a comprehensive MRSA colonization surveillance program. It is not intended to diagnose MRSA infection nor to guide or monitor treatment for MRSA infections. Performed at [facility], [place] [address] [place], [place] [address]   Culture, blood (Routine X 2) w Reflex to ID Panel     Status: None (Preliminary result)   Collection Time: 02/29/20  9:46 PM   Specimen: BLOOD RIGHT HAND  Result Value Ref Range Status   Specimen Description   Final    BLOOD RIGHT HAND Performed at [facility] [address] [place], [place] [address]    Special Requests   Final    BOTTLES DRAWN AEROBIC ONLY Blood Culture results may not be optimal due to an inadequate volume of blood received in culture bottles Performed at [facility] [address], [place], [place] [address]    Culture   Final    NO GROWTH < 12 HOURS Performed at [facility] [address], [place], [place] [address]    Report Status PENDING  Incomplete  Culture, blood (Routine X 2) w Reflex to ID Panel     Status: None (Preliminary result)   Collection Time: 02/29/20  9:46 PM   Specimen: Right Antecubital; Blood  Result Value Ref Range Status   Specimen Description   Final    RIGHT ANTECUBITAL BLOOD Performed at [facility], [place] [address], [place], [place] [address]    Special Requests   Final    BOTTLES DRAWN AEROBIC ONLY Blood Culture results may not be optimal due to an inadequate  volume of blood received in culture bottles Performed at [facility] [address], [place], [place] [address]    Culture   Final    NO GROWTH <  12 HOURS Performed at [facility], [place] [address], [place], [place] [address]    Report Status PENDING  Incomplete    Studies/Results: DG CHEST PORT 1 VIEW  Result Date: 02/29/2020 CLINICAL DATA:  74 year old male with partial hepatectomy and cholecystectomy. Fever and shortness of breath. EXAM: PORTABLE CHEST 1 VIEW COMPARISON:  Chest radiograph dated 11/11/2019. FINDINGS: There is shallow inspiration with bibasilar atelectasis. No focal consolidation, pleural effusion or pneumothorax. There is mild cardiomegaly with mild central vascular congestion. No acute osseous pathology. Lower cervical ACDF. IMPRESSION: No focal consolidation. Electronically Signed   By: Anner Crete M.D.   On: 02/29/2020 20:59    Assessment: Procedure(s): OPEN PARTIAL HEPATECTOMY, CHOLECYSTECTOMY OPEN RIGHT RENAL CELL METASTATECTOMY AND  ADRENALECTOMY, 5 Days Post-Op, overall doing well  - Anemia from acute surgical blood loss - Pain controlled with oral/IV medication - HTN - hypervolemia/pain/ essential HTN - DM  - acute on chronic renal failure  Plan: - PO pain medications - Restart all home meds - holding DM medication and covering with SSI - Low fat diet - ISS/SQH - PT   Plan for d/c tomorrow.  Home health needs to be addressed by [facility], but they will need to be contacted.  03/02/2020, 6:41 AM

## 2020-03-02 NOTE — Care Management Important Message (Signed)
Important Message  Patient Details IM Letter given to the Patient Name: Antonio Adams MRN: [id number] Date of Birth: Apr 24, 1946   Medicare Important Message Given:  Yes     Kerin Salen 03/02/2020, 11:18 AM

## 2020-03-02 NOTE — Progress Notes (Signed)
Called [facility] social worker Horald Chestnut at [phone] ext [phone]. Answering machine answered stated that Mr. Horald Chestnut is not anymore connected with [facility]. SW Lorre Nick was notified.

## 2020-03-03 LAB — CBC
HCT: 25.6 % — ABNORMAL LOW (ref 39.0–52.0)
Hemoglobin: 8.4 g/dL — ABNORMAL LOW (ref 13.0–17.0)
MCH: 29.5 pg (ref 26.0–34.0)
MCHC: 32.8 g/dL (ref 30.0–36.0)
MCV: 89.8 fL (ref 80.0–100.0)
Platelets: 127 10*3/uL — ABNORMAL LOW (ref 150–400)
RBC: 2.85 MIL/uL — ABNORMAL LOW (ref 4.22–5.81)
RDW: 15.7 % — ABNORMAL HIGH (ref 11.5–15.5)
WBC: 6 10*3/uL (ref 4.0–10.5)
nRBC: 0.3 % — ABNORMAL HIGH (ref 0.0–0.2)

## 2020-03-03 LAB — GLUCOSE, CAPILLARY
Glucose-Capillary: 108 mg/dL — ABNORMAL HIGH (ref 70–99)
Glucose-Capillary: 126 mg/dL — ABNORMAL HIGH (ref 70–99)
Glucose-Capillary: 128 mg/dL — ABNORMAL HIGH (ref 70–99)
Glucose-Capillary: 153 mg/dL — ABNORMAL HIGH (ref 70–99)

## 2020-03-03 LAB — BASIC METABOLIC PANEL
Anion gap: 12 (ref 5–15)
BUN: 42 mg/dL — ABNORMAL HIGH (ref 8–23)
CO2: 20 mmol/L — ABNORMAL LOW (ref 22–32)
Calcium: 9.6 mg/dL (ref 8.9–10.3)
Chloride: 104 mmol/L (ref 98–111)
Creatinine, Ser: 2.96 mg/dL — ABNORMAL HIGH (ref 0.61–1.24)
GFR, Estimated: 20 mL/min — ABNORMAL LOW (ref >60)
Glucose, Bld: 129 mg/dL — ABNORMAL HIGH (ref 70–99)
Potassium: 4 mmol/L (ref 3.5–5.1)
Sodium: 136 mmol/L (ref 135–145)

## 2020-03-03 MED ORDER — AMLODIPINE BESYLATE 5 MG PO TABS
5.0000 mg | ORAL_TABLET | Freq: Every day | ORAL | 0 refills | Status: DC
Start: 2020-03-03 — End: 2020-05-23

## 2020-03-03 MED ORDER — TRAMADOL HCL 50 MG PO TABS
50.0000 mg | ORAL_TABLET | Freq: Four times a day (QID) | ORAL | 0 refills | Status: DC | PRN
Start: 2020-03-03 — End: 2020-05-20

## 2020-03-03 MED ORDER — ACETAMINOPHEN 500 MG PO TABS
1000.0000 mg | ORAL_TABLET | Freq: Four times a day (QID) | ORAL | 0 refills | Status: DC
Start: 2020-03-03 — End: 2021-05-23

## 2020-03-03 MED ORDER — MAGNESIUM CITRATE PO SOLN
1.0000 | Freq: Once | ORAL | Status: AC
  Administered 2020-03-03: 1 via ORAL
  Filled 2020-03-03: qty 296

## 2020-03-03 MED ORDER — DOCUSATE SODIUM 100 MG PO CAPS
100.0000 mg | ORAL_CAPSULE | Freq: Two times a day (BID) | ORAL | 0 refills | Status: DC
Start: 2020-03-03 — End: 2021-05-23

## 2020-03-03 NOTE — Discharge Summary (Addendum)
Date of admission: 02/26/2020  Date of discharge: 03/03/2020  Admission diagnosis: Oligometastatic recurrent kidney cancer  Discharge diagnosis: Oligometastatic recurrent kidney cancer  Secondary diagnoses: None  History and Physical: For full details, please see admission history and physical. Briefly, Antonio Adams is a 74 y.o. male with history of renal cell carcinoma and who underwent a partial nephrectomy 6 years prior.  As part of his surveillance he was noted to have recurrence/metastatic. This was biopsy-proven.  Hospital Course:  He underwent a joint case with urology (exploratory laparotomy, right adrenalectomy, and metastatic resection of recurrent kidney cancer in right retroperitoneum) and general surgery (partial hepatectomy and cholecystectomy) on 02/26/20. He received 2u pRBC and 2u plasma during the case. An epidural was placed prior to surgery for post-operative pain management. He tolerated the procedure well and was extubated after its completion. He was transferred to the ICU postoperatively for pressor support.  He was weaned from pressor support on POD1, and he remained hemodynamically stable.  He received an additional 2u pRBC during his hospitalization course, and his hemoglobin subsequently normalized. He initially had an NGT post-operatively, which was removed on POD1, and his diet was gradually advanced to regular per the guidance of general surgery. He had return of bowel function with first bowel movement on POD2. His JP drain output remained low, and it was removed on POD2. He was transitioned for floor status on POD2. His pain was well-controlled post-operatively with the epidural, which was removed on POD3. He was successfully transitioned to PO and IV analgesics. His foley catheter was removed several hours after epidural removal, and he was able to void spontaneously.  His blood pressure remained elevated during his hospital stay but overall stable/improved  compared to his baseline of SBP 160-200. His home indapamide and telmisartan were held due to persistently elevated Cr to ~3 from baseline 2.2. His home amlodipine was increased to 5mg  from 2.5mg  QD.   Physical therapy evaluated the patient during his hospitalization and cleared him for discharge home with [facility].  By [place], the patient was tolerating a regular diet without nausea/emesis, voiding spontaneously, ambulating, and having pain controlled with oral analgesics. He was deemed appropriate for discharge home with [facility].   SBP in 150s at time of discharge, which was improved compared to his baseline. He was instructed to continue amlodipine 5mg  QD, hold indapamide and telmisartan, and follow up with his PCP within 1-2 weeks of discharge to review blood pressure medications in the setting of worsening chronic kidney disease.   Laboratory values:  Recent Labs    03/01/20 0316 03/02/20 0444 03/03/20 0452  HGB 8.5* 9.1* 8.4*  HCT 25.1* 26.4* 25.6*   Recent Labs    03/02/20 0444 03/03/20 0452  CREATININE 2.74* 2.96*    Disposition: Home  Discharge instruction: The patient was instructed to be ambulatory but told to refrain from heavy lifting, strenuous activity, or driving.   Discharge medications:  Allergies as of 03/03/2020      Reactions   Ace Inhibitors Swelling   Lisinopril Swelling      Medication List    STOP taking these medications   diclofenac Sodium 1 % Gel Commonly known as: Voltaren   fluticasone 50 MCG/ACT nasal spray Commonly known as: FLONASE   FreeStyle Libre 14 Day Sensor Misc   Kerendia 10 MG Tabs Generic drug: Finerenone   l-methylfolate-B6-B12 3-35-2 MG Tabs tablet Commonly known as: METANX   omeprazole 40 MG capsule Commonly known as: PRILOSEC   telmisartan 40  MG tablet Commonly known as: MICARDIS     TAKE these medications   acetaminophen 500 MG tablet Commonly known as: TYLENOL Take 2 tablets (1,000 mg total) by mouth every 6 (six)  hours.   amLODipine 5 MG tablet Commonly known as: NORVASC Take 1 tablet (5 mg total) by mouth daily. What changed:   medication strength  how much to take   aspirin EC 81 MG tablet Take 81 mg by mouth daily. Swallow whole.   atorvastatin 40 MG tablet Commonly known as: LIPITOR Take 1 tablet (40 mg total) by mouth daily.   cholecalciferol 1000 units tablet Commonly known as: VITAMIN D Take 1,000 Units by mouth daily.   clotrimazole-betamethasone cream Commonly known as: LOTRISONE APPLY TO AFFECTED AREA TWICE A DAY   diltiazem 120 MG tablet Commonly known as: CARDIZEM Take 120 mg by mouth 4 (four) times daily.   docusate sodium 100 MG capsule Commonly known as: COLACE Take 1 capsule (100 mg total) by mouth 2 (two) times daily.   ferrous sulfate 325 (65 FE) MG tablet Take 1 tablet (325 mg total) by mouth 2 (two) times daily. Patient needs office visit for further refills What changed:   when to take this  additional instructions   FreeStyle Libre 14 Day Reader Kerrin Mo 1 Act by Does not apply route daily.   gabapentin 100 MG capsule Commonly known as: NEURONTIN Take 100 mg by mouth at bedtime. What changed: Another medication with the same name was removed. Continue taking this medication, and follow the directions you see here.   gemfibrozil 600 MG tablet Commonly known as: LOPID Take 600 mg by mouth 2 (two) times daily before a meal.   hydrOXYzine 25 MG tablet Commonly known as: ATARAX/VISTARIL Take 25 mg by mouth 3 (three) times daily as needed for anxiety, itching or nausea.   indapamide 1.25 MG tablet Commonly known as: LOZOL Take 1 tablet (1.25 mg total) by mouth daily.   Janumet XR 50-1000 MG Tb24 Generic drug: SitaGLIPtin-MetFORMIN HCl Take 1 tablet by mouth daily.   Lantus SoloStar 100 UNIT/ML Solostar Pen Generic drug: insulin glargine INJECT 50 UNITS INTO THE SKIN EVERY MORNING. What changed: See the new instructions.   methocarbamol 750 MG  tablet Commonly known as: ROBAXIN Take 750 mg by mouth 4 (four) times daily.   NovoFine 32G X 6 MM Misc Generic drug: Insulin Pen Needle 1 Act by Does not apply route daily. USE TID   ONE TOUCH ULTRA TEST test strip Generic drug: glucose blood USE 2 (TWO) TIMES DAILY. AND LANCETS 2/DAY 250.03   tamsulosin 0.4 MG Caps capsule Commonly known as: FLOMAX TAKE 1 CAPSULE (0.4 MG TOTAL) BY MOUTH DAILY. What changed: See the new instructions.   traMADol 50 MG tablet Commonly known as: ULTRAM Take 1-2 tablets (50-100 mg total) by mouth every 6 (six) hours as needed for moderate pain. What changed: how much to take   VISINE OP Place 1 drop into both eyes daily as needed (redness).       Followup:   Follow-up Information    [facility] Follow up.   Specialty: Home Health Services Why: to provide home health nursing, physical and occupational therapy Contact information: [place] [address] [phone]        [name], MD On 03/11/2020.   Specialty: Urology Why: 1:15pm Contact information: [address] [place] [address] [phone]

## 2020-03-03 NOTE — Progress Notes (Signed)
Home health orders written [facility]criptions [facility] [address] Ordered Mag Citrate for bowels this AM Increased tramadol to 50-100mg   Plan for discharge later today.

## 2020-03-03 NOTE — Plan of Care (Signed)
Pt was discharged home today. Instructions were reviewed with patient, and questions were answered. Pt was taken to main entrance via wheelchair.   

## 2020-03-03 NOTE — Discharge Instructions (Signed)

## 2020-03-03 NOTE — TOC Transition Note (Signed)
Transition of Care [facility]) - CM/SW Discharge Note   Patient Details  Name: Antonio Adams MRN: [id number] Date of Birth: [birth year]  Transition of Care [facility]) CM/SW Contact:  Lennart Pall, LCSW Phone Number: 03/03/2020, 9:54 AM   Clinical Narrative:    Pt medically cleared for dc today.  Met with him yesterday and he confirms that his family will be staying with him initially to provide any needed assistance.  Pt is agreeable with orders for [facility] follow up and no agency preference.  Referrals placed with [facility].  No further TOC needs.   Final next level of care: [place] Barriers to Discharge: Continued Medical Work up   Patient Goals and CMS Choice Patient states their goals for this hospitalization and ongoing recovery are:: to go home CMS Medicare.gov Compare Post Acute Care list provided to:: Patient    Discharge Placement                       Discharge Plan and Services   Discharge Planning Services: CM Consult            DME Arranged: N/A DME Agency: NA       HH Arranged: PT, OT, RN, aide [place] Agency: [facility] Date [place]: 03/02/20 Time [place]: 1532 Representative spoke with at [place]: [place] ([place]) Interventions     Readmission Risk Interventions No flowsheet data found.

## 2020-03-04 DIAGNOSIS — M6281 Muscle weakness (generalized): Secondary | ICD-10-CM | POA: Diagnosis not present

## 2020-03-04 DIAGNOSIS — E119 Type 2 diabetes mellitus without complications: Secondary | ICD-10-CM | POA: Diagnosis not present

## 2020-03-04 DIAGNOSIS — I10 Essential (primary) hypertension: Secondary | ICD-10-CM | POA: Diagnosis not present

## 2020-03-04 DIAGNOSIS — Z794 Long term (current) use of insulin: Secondary | ICD-10-CM | POA: Diagnosis not present

## 2020-03-04 DIAGNOSIS — C649 Malignant neoplasm of unspecified kidney, except renal pelvis: Secondary | ICD-10-CM | POA: Diagnosis not present

## 2020-03-04 DIAGNOSIS — C641 Malignant neoplasm of right kidney, except renal pelvis: Secondary | ICD-10-CM | POA: Diagnosis not present

## 2020-03-06 LAB — CULTURE, BLOOD (ROUTINE X 2)
Culture: NO GROWTH
Culture: NO GROWTH

## 2020-03-08 DIAGNOSIS — E119 Type 2 diabetes mellitus without complications: Secondary | ICD-10-CM | POA: Diagnosis not present

## 2020-03-08 DIAGNOSIS — M6281 Muscle weakness (generalized): Secondary | ICD-10-CM | POA: Diagnosis not present

## 2020-03-08 DIAGNOSIS — Z794 Long term (current) use of insulin: Secondary | ICD-10-CM | POA: Diagnosis not present

## 2020-03-08 DIAGNOSIS — C649 Malignant neoplasm of unspecified kidney, except renal pelvis: Secondary | ICD-10-CM | POA: Diagnosis not present

## 2020-03-08 DIAGNOSIS — C641 Malignant neoplasm of right kidney, except renal pelvis: Secondary | ICD-10-CM | POA: Diagnosis not present

## 2020-03-08 DIAGNOSIS — I10 Essential (primary) hypertension: Secondary | ICD-10-CM | POA: Diagnosis not present

## 2020-03-09 DIAGNOSIS — Z794 Long term (current) use of insulin: Secondary | ICD-10-CM | POA: Diagnosis not present

## 2020-03-09 DIAGNOSIS — C649 Malignant neoplasm of unspecified kidney, except renal pelvis: Secondary | ICD-10-CM | POA: Diagnosis not present

## 2020-03-09 DIAGNOSIS — I10 Essential (primary) hypertension: Secondary | ICD-10-CM | POA: Diagnosis not present

## 2020-03-09 DIAGNOSIS — M6281 Muscle weakness (generalized): Secondary | ICD-10-CM | POA: Diagnosis not present

## 2020-03-09 DIAGNOSIS — C641 Malignant neoplasm of right kidney, except renal pelvis: Secondary | ICD-10-CM | POA: Diagnosis not present

## 2020-03-09 DIAGNOSIS — E119 Type 2 diabetes mellitus without complications: Secondary | ICD-10-CM | POA: Diagnosis not present

## 2020-03-10 ENCOUNTER — Telehealth: Payer: Self-pay | Admitting: Internal Medicine

## 2020-03-10 NOTE — Telephone Encounter (Signed)
JANUMET XR 50-1000 MG TB24 [id number] [address], [place] Phone:  [phone]  Fax:  [phone]

## 2020-03-11 DIAGNOSIS — C7971 Secondary malignant neoplasm of right adrenal gland: Secondary | ICD-10-CM | POA: Diagnosis not present

## 2020-03-11 DIAGNOSIS — C641 Malignant neoplasm of right kidney, except renal pelvis: Secondary | ICD-10-CM | POA: Diagnosis not present

## 2020-03-11 NOTE — Telephone Encounter (Signed)
Attempted to call pt. His VM is full. Can not leave a msg.

## 2020-03-12 DIAGNOSIS — M6281 Muscle weakness (generalized): Secondary | ICD-10-CM | POA: Diagnosis not present

## 2020-03-12 DIAGNOSIS — Z794 Long term (current) use of insulin: Secondary | ICD-10-CM | POA: Diagnosis not present

## 2020-03-12 DIAGNOSIS — I10 Essential (primary) hypertension: Secondary | ICD-10-CM | POA: Diagnosis not present

## 2020-03-12 DIAGNOSIS — C649 Malignant neoplasm of unspecified kidney, except renal pelvis: Secondary | ICD-10-CM | POA: Diagnosis not present

## 2020-03-12 DIAGNOSIS — E119 Type 2 diabetes mellitus without complications: Secondary | ICD-10-CM | POA: Diagnosis not present

## 2020-03-12 DIAGNOSIS — C641 Malignant neoplasm of right kidney, except renal pelvis: Secondary | ICD-10-CM | POA: Diagnosis not present

## 2020-03-14 ENCOUNTER — Other Ambulatory Visit: Payer: Self-pay | Admitting: Internal Medicine

## 2020-03-14 DIAGNOSIS — I10 Essential (primary) hypertension: Secondary | ICD-10-CM

## 2020-03-15 ENCOUNTER — Encounter (HOSPITAL_COMMUNITY): Payer: Self-pay | Admitting: General Surgery

## 2020-03-16 ENCOUNTER — Encounter (HOSPITAL_COMMUNITY): Payer: Self-pay | Admitting: General Surgery

## 2020-03-17 DIAGNOSIS — C649 Malignant neoplasm of unspecified kidney, except renal pelvis: Secondary | ICD-10-CM | POA: Diagnosis not present

## 2020-03-17 DIAGNOSIS — Z794 Long term (current) use of insulin: Secondary | ICD-10-CM | POA: Diagnosis not present

## 2020-03-17 DIAGNOSIS — C641 Malignant neoplasm of right kidney, except renal pelvis: Secondary | ICD-10-CM | POA: Diagnosis not present

## 2020-03-17 DIAGNOSIS — I10 Essential (primary) hypertension: Secondary | ICD-10-CM | POA: Diagnosis not present

## 2020-03-17 DIAGNOSIS — M6281 Muscle weakness (generalized): Secondary | ICD-10-CM | POA: Diagnosis not present

## 2020-03-17 DIAGNOSIS — E119 Type 2 diabetes mellitus without complications: Secondary | ICD-10-CM | POA: Diagnosis not present

## 2020-03-19 DIAGNOSIS — E119 Type 2 diabetes mellitus without complications: Secondary | ICD-10-CM | POA: Diagnosis not present

## 2020-03-19 DIAGNOSIS — M6281 Muscle weakness (generalized): Secondary | ICD-10-CM | POA: Diagnosis not present

## 2020-03-19 DIAGNOSIS — I10 Essential (primary) hypertension: Secondary | ICD-10-CM | POA: Diagnosis not present

## 2020-03-19 DIAGNOSIS — C641 Malignant neoplasm of right kidney, except renal pelvis: Secondary | ICD-10-CM | POA: Diagnosis not present

## 2020-03-19 DIAGNOSIS — C649 Malignant neoplasm of unspecified kidney, except renal pelvis: Secondary | ICD-10-CM | POA: Diagnosis not present

## 2020-03-19 DIAGNOSIS — Z794 Long term (current) use of insulin: Secondary | ICD-10-CM | POA: Diagnosis not present

## 2020-04-03 ENCOUNTER — Other Ambulatory Visit: Payer: Self-pay | Admitting: Internal Medicine

## 2020-04-03 DIAGNOSIS — I10 Essential (primary) hypertension: Secondary | ICD-10-CM

## 2020-04-14 ENCOUNTER — Other Ambulatory Visit: Payer: Self-pay | Admitting: Internal Medicine

## 2020-04-26 ENCOUNTER — Other Ambulatory Visit: Payer: Self-pay | Admitting: Internal Medicine

## 2020-04-26 DIAGNOSIS — I10 Essential (primary) hypertension: Secondary | ICD-10-CM

## 2020-05-20 ENCOUNTER — Other Ambulatory Visit: Payer: Self-pay | Admitting: Internal Medicine

## 2020-05-20 ENCOUNTER — Other Ambulatory Visit: Payer: Self-pay

## 2020-05-20 ENCOUNTER — Encounter: Payer: Self-pay | Admitting: Internal Medicine

## 2020-05-20 ENCOUNTER — Ambulatory Visit (INDEPENDENT_AMBULATORY_CARE_PROVIDER_SITE_OTHER): Payer: Medicare Other | Admitting: Internal Medicine

## 2020-05-20 VITALS — BP 152/78 | HR 75 | Temp 98.7°F | Resp 16 | Ht 71.0 in | Wt 211.0 lb

## 2020-05-20 DIAGNOSIS — I10 Essential (primary) hypertension: Secondary | ICD-10-CM

## 2020-05-20 DIAGNOSIS — Z794 Long term (current) use of insulin: Secondary | ICD-10-CM

## 2020-05-20 DIAGNOSIS — C61 Malignant neoplasm of prostate: Secondary | ICD-10-CM | POA: Diagnosis not present

## 2020-05-20 DIAGNOSIS — Z0000 Encounter for general adult medical examination without abnormal findings: Secondary | ICD-10-CM | POA: Diagnosis not present

## 2020-05-20 DIAGNOSIS — E118 Type 2 diabetes mellitus with unspecified complications: Secondary | ICD-10-CM

## 2020-05-20 DIAGNOSIS — Z710 Person encountering health services to consult on behalf of another person: Secondary | ICD-10-CM | POA: Insufficient documentation

## 2020-05-20 DIAGNOSIS — Z7189 Other specified counseling: Secondary | ICD-10-CM | POA: Insufficient documentation

## 2020-05-20 DIAGNOSIS — D539 Nutritional anemia, unspecified: Secondary | ICD-10-CM | POA: Diagnosis not present

## 2020-05-20 DIAGNOSIS — H9313 Tinnitus, bilateral: Secondary | ICD-10-CM

## 2020-05-20 DIAGNOSIS — N1832 Chronic kidney disease, stage 3b: Secondary | ICD-10-CM | POA: Diagnosis not present

## 2020-05-20 DIAGNOSIS — N3281 Overactive bladder: Secondary | ICD-10-CM | POA: Diagnosis not present

## 2020-05-20 DIAGNOSIS — E785 Hyperlipidemia, unspecified: Secondary | ICD-10-CM | POA: Diagnosis not present

## 2020-05-20 DIAGNOSIS — Z0001 Encounter for general adult medical examination with abnormal findings: Secondary | ICD-10-CM

## 2020-05-20 LAB — LIPID PANEL
Cholesterol: 167 mg/dL (ref 0–200)
HDL: 42 mg/dL (ref >39.00)
LDL Cholesterol: 99 mg/dL (ref 0–99)
NonHDL: 125.46
Total CHOL/HDL Ratio: 4
Triglycerides: 130 mg/dL (ref 0.0–149.0)
VLDL: 26 mg/dL (ref 0.0–40.0)

## 2020-05-20 LAB — BASIC METABOLIC PANEL
BUN: 30 mg/dL — ABNORMAL HIGH (ref 6–23)
CO2: 26 mEq/L (ref 19–32)
Calcium: 9.9 mg/dL (ref 8.4–10.5)
Chloride: 106 mEq/L (ref 96–112)
Creatinine, Ser: 2.97 mg/dL — ABNORMAL HIGH (ref 0.40–1.50)
GFR: 20.14 mL/min — ABNORMAL LOW (ref >60.00)
Glucose, Bld: 194 mg/dL — ABNORMAL HIGH (ref 70–99)
Potassium: 4.5 mEq/L (ref 3.5–5.1)
Sodium: 138 mEq/L (ref 135–145)

## 2020-05-20 LAB — CBC WITH DIFFERENTIAL/PLATELET
Basophils Absolute: 0 10*3/uL (ref 0.0–0.1)
Basophils Relative: 1.3 % (ref 0.0–3.0)
Eosinophils Absolute: 0.1 10*3/uL (ref 0.0–0.7)
Eosinophils Relative: 4.3 % (ref 0.0–5.0)
HCT: 27.9 % — ABNORMAL LOW (ref 39.0–52.0)
Hemoglobin: 9.7 g/dL — ABNORMAL LOW (ref 13.0–17.0)
Lymphocytes Relative: 27.6 % (ref 12.0–46.0)
Lymphs Abs: 0.9 10*3/uL (ref 0.7–4.0)
MCHC: 34.6 g/dL (ref 30.0–36.0)
MCV: 85.5 fl (ref 78.0–100.0)
Monocytes Absolute: 0.3 10*3/uL (ref 0.1–1.0)
Monocytes Relative: 9.7 % (ref 3.0–12.0)
Neutro Abs: 1.8 10*3/uL (ref 1.4–7.7)
Neutrophils Relative %: 57.1 % (ref 43.0–77.0)
Platelets: 221 10*3/uL (ref 150.0–400.0)
RBC: 3.26 Mil/uL — ABNORMAL LOW (ref 4.22–5.81)
RDW: 16.8 % — ABNORMAL HIGH (ref 11.5–15.5)
WBC: 3.2 10*3/uL — ABNORMAL LOW (ref 4.0–10.5)

## 2020-05-20 LAB — URINALYSIS, ROUTINE W REFLEX MICROSCOPIC
Bilirubin Urine: NEGATIVE
Ketones, ur: NEGATIVE
Leukocytes,Ua: NEGATIVE
Nitrite: NEGATIVE
Specific Gravity, Urine: 1.02 (ref 1.000–1.030)
Total Protein, Urine: >=300 — AB
Urine Glucose: 250 — AB
Urobilinogen, UA: 0.2 (ref 0.0–1.0)
WBC, UA: NONE SEEN (ref 0–?)
pH: 6.5 (ref 5.0–8.0)

## 2020-05-20 LAB — IRON: Iron: 90 ug/dL (ref 42–165)

## 2020-05-20 LAB — HEPATIC FUNCTION PANEL
ALT: 12 U/L (ref 0–53)
AST: 13 U/L (ref 0–37)
Albumin: 4.1 g/dL (ref 3.5–5.2)
Alkaline Phosphatase: 121 U/L — ABNORMAL HIGH (ref 39–117)
Bilirubin, Direct: 0.1 mg/dL (ref 0.0–0.3)
Total Bilirubin: 0.3 mg/dL (ref 0.2–1.2)
Total Protein: 6.7 g/dL (ref 6.0–8.3)

## 2020-05-20 LAB — VITAMIN B12: Vitamin B-12: 1230 pg/mL — ABNORMAL HIGH (ref 211–911)

## 2020-05-20 LAB — FERRITIN: Ferritin: 82.3 ng/mL (ref 22.0–322.0)

## 2020-05-20 LAB — FOLATE: Folate: 20.4 ng/mL

## 2020-05-20 LAB — PSA: PSA: 0.01 ng/mL — ABNORMAL LOW (ref 0.10–4.00)

## 2020-05-20 LAB — RETICULOCYTES
ABS Retic: 47180 cells/uL (ref 25000–9000)
Retic Ct Pct: 1.4 %

## 2020-05-20 LAB — HEMOGLOBIN A1C: Hgb A1c MFr Bld: 7 % — ABNORMAL HIGH (ref 4.6–6.5)

## 2020-05-20 MED ORDER — SOLIFENACIN SUCCINATE 5 MG PO TABS
5.0000 mg | ORAL_TABLET | Freq: Every day | ORAL | 1 refills | Status: DC
Start: 2020-05-20 — End: 2020-12-28

## 2020-05-20 MED ORDER — GEMTESA 75 MG PO TABS: 1.0000 | ORAL_TABLET | Freq: Every day | ORAL | 1 refills | Status: DC

## 2020-05-20 NOTE — Patient Instructions (Signed)

## 2020-05-20 NOTE — Progress Notes (Signed)
Subjective:  Patient ID: Antonio Adams, male    DOB: May 06, 1946  Age: 75 y.o. MRN: [id number]  CC: Anemia, Annual Exam, Hypertension, and Hyperlipidemia  This visit occurred during the SARS-CoV-2 public health emergency.  Safety protocols were in place, including screening questions prior to the visit, additional usage of staff PPE, and extensive cleaning of exam room while observing appropriate contact time as indicated for disinfecting solutions.    HPI Antonio Adams presents for a CPX.  1.  He is status post surgery to remove metastatic renal cell carcinoma.  He tells me his recovery has been unremarkable.  2.  He does not monitor his blood sugar and does not think he is currently taking anything for blood sugar control.  He does complain of polyuria and nocturia.  3.  He is not been monitoring his blood pressure.  He is active and denies any recent episodes of headache, blurred vision, chest pain, shortness of breath, palpitations, or edema.  Outpatient Medications Prior to Visit  Medication Sig Dispense Refill   acetaminophen (TYLENOL) 500 MG tablet Take 2 tablets (1,000 mg total) by mouth every 6 (six) hours. 60 tablet 0   aspirin EC 81 MG tablet Take 81 mg by mouth daily. Swallow whole.     atorvastatin (LIPITOR) 40 MG tablet Take 1 tablet (40 mg total) by mouth daily. 90 tablet 1   cholecalciferol (VITAMIN D) 1000 units tablet Take 1,000 Units by mouth daily.     clotrimazole-betamethasone (LOTRISONE) cream APPLY TO AFFECTED AREA TWICE A DAY 30 g 0   docusate sodium (COLACE) 100 MG capsule Take 1 capsule (100 mg total) by mouth 2 (two) times daily. 60 capsule 0   ferrous sulfate 325 (65 FE) MG tablet Take 1 tablet (325 mg total) by mouth 2 (two) times daily. Patient needs office visit for further refills (Patient taking differently: Take 325 mg by mouth daily with breakfast.) 60 tablet 1   gabapentin (NEURONTIN) 100 MG capsule Take 100 mg by mouth at bedtime.      gemfibrozil (LOPID) 600 MG tablet Take 600 mg by mouth 2 (two) times daily before a meal.     hydrOXYzine (ATARAX/VISTARIL) 25 MG tablet Take 25 mg by mouth 3 (three) times daily as needed for anxiety, itching or nausea.      methocarbamol (ROBAXIN) 750 MG tablet Take 750 mg by mouth 4 (four) times daily.     ONE TOUCH ULTRA TEST test strip USE 2 (TWO) TIMES DAILY. AND LANCETS 2/DAY 250.03 100 each 11   tamsulosin (FLOMAX) 0.4 MG CAPS capsule TAKE 1 CAPSULE (0.4 MG TOTAL) BY MOUTH DAILY. (Patient taking differently: Take 0.4 mg by mouth daily.) 30 capsule 1   amLODipine (NORVASC) 5 MG tablet Take 1 tablet (5 mg total) by mouth daily. 30 tablet 0   Continuous Blood Gluc Receiver (FREESTYLE LIBRE 14 DAY READER) DEVI 1 Act by Does not apply route daily. 1 each 9   diltiazem (CARDIZEM) 120 MG tablet Take 120 mg by mouth 4 (four) times daily.     indapamide (LOZOL) 1.25 MG tablet Take 1 tablet (1.25 mg total) by mouth daily. 90 tablet 0   Insulin Pen Needle (NOVOFINE) 32G X 6 MM MISC 1 Act by Does not apply route daily. USE TID 100 each 11   JANUMET XR 50-1000 MG TB24 Take 1 tablet by mouth daily.     LANTUS SOLOSTAR 100 UNIT/ML Solostar Pen INJECT 50 UNITS INTO THE SKIN EVERY MORNING. (Patient  taking differently: Inject 30 Units into the skin daily as needed (blood sugar over 200).) 15 mL 1   Tetrahydrozoline HCl (VISINE OP) Place 1 drop into both eyes daily as needed (redness).     traMADol (ULTRAM) 50 MG tablet Take 1-2 tablets (50-100 mg total) by mouth every 6 (six) hours as needed for moderate pain. 30 tablet 0   No facility-administered medications prior to visit.    ROS Review of Systems  Constitutional: Negative for appetite change, chills, diaphoresis, fatigue and fever.  HENT: Positive for tinnitus. Negative for ear pain, facial swelling, sinus pressure and trouble swallowing.   Eyes: Negative.  Negative for visual disturbance.  Respiratory: Negative for cough, chest  tightness, shortness of breath and wheezing.   Cardiovascular: Negative for chest pain, palpitations and leg swelling.  Gastrointestinal: Negative for abdominal pain, blood in stool, constipation, diarrhea, nausea and vomiting.  Endocrine: Positive for polyuria.  Genitourinary: Positive for frequency. Negative for difficulty urinating, dysuria, flank pain, hematuria, scrotal swelling and urgency.  Musculoskeletal: Negative for arthralgias and myalgias.  Skin: Negative.  Negative for color change, pallor and rash.  Neurological: Positive for weakness. Negative for dizziness, light-headedness, numbness and headaches.  Hematological: Negative for adenopathy. Does not bruise/bleed easily.  Psychiatric/Behavioral: Negative.     Objective:  BP (!) 152/78    Pulse 75    Temp 98.7 F (37.1 C) (Oral)    Resp 16    Ht 5\' 11"  (1.803 m)    Wt 211 lb (95.7 kg)    SpO2 97%    BMI 29.43 kg/m   BP Readings from Last 3 Encounters:  05/20/20 (!) 152/78  03/03/20 (!) 147/81  02/16/20 (!) 160/82    Wt Readings from Last 3 Encounters:  05/20/20 211 lb (95.7 kg)  02/26/20 208 lb 15.9 oz (94.8 kg)  02/16/20 212 lb 4.8 oz (96.3 kg)    Physical Exam Vitals reviewed.  Constitutional:      Appearance: Normal appearance.  HENT:     Right Ear: Hearing, tympanic membrane, ear canal and external ear normal.     Left Ear: Hearing, tympanic membrane, ear canal and external ear normal.     Nose: Nose normal.     Mouth/Throat:     Mouth: Mucous membranes are moist.  Eyes:     General: No scleral icterus.    Conjunctiva/sclera: Conjunctivae normal.  Cardiovascular:     Rate and Rhythm: Normal rate and regular rhythm.     Heart sounds: No murmur heard.   Pulmonary:     Effort: Pulmonary effort is normal.     Breath sounds: No stridor. No wheezing, rhonchi or rales.  Abdominal:     General: Abdomen is flat.     Palpations: There is no mass.     Tenderness: There is no abdominal tenderness. There is no  guarding.  Musculoskeletal:        General: Normal range of motion.     Cervical back: Neck supple.     Right lower leg: No edema.     Left lower leg: No edema.  Lymphadenopathy:     Cervical: No cervical adenopathy.  Skin:    General: Skin is warm and dry.     Coloration: Skin is not pale.  Neurological:     General: No focal deficit present.     Mental Status: He is alert and oriented to person, place, and time. Mental status is at baseline.  Psychiatric:  Mood and Affect: Mood normal.        Behavior: Behavior normal.     Lab Results  Component Value Date   WBC 3.2 (L) 05/20/2020   HGB 9.7 (L) 05/20/2020   HCT 27.9 (L) 05/20/2020   PLT 221.0 05/20/2020   GLUCOSE 194 (H) 05/20/2020   CHOL 167 05/20/2020   TRIG 130.0 05/20/2020   HDL 42.00 05/20/2020   LDLCALC 99 05/20/2020   ALT 12 05/20/2020   AST 13 05/20/2020   NA 138 05/20/2020   K 4.5 05/20/2020   CL 106 05/20/2020   CREATININE 2.97 (H) 05/20/2020   BUN 30 (H) 05/20/2020   CO2 26 05/20/2020   TSH 1.82 04/02/2019   PSA 0.01 (L) 05/20/2020   INR 1.3 (H) 02/26/2020   HGBA1C 7.0 (H) 05/20/2020   MICROALBUR 97.0 12/18/2019    No results found.  Assessment & Plan:   Rhea was seen today for anemia, annual exam, hypertension and hyperlipidemia.  Diagnoses and all orders for this visit:  Essential hypertension- His blood pressure is not adequately well controlled.  I recommended that he restart amlodipine. -     Basic metabolic panel; Future -     Urinalysis, Routine w reflex microscopic; Future -     Urinalysis, Routine w reflex microscopic -     Basic metabolic panel -     amLODipine (NORVASC) 5 MG tablet; Take 1 tablet (5 mg total) by mouth daily.  Type II diabetes mellitus with manifestations ([place])- His A1c is at 7.0%.  I do not think he is benefiting from the DPP 4 inhibitor and his renal function has declined so I recommended that he stop taking Metformin.  Will restart basal insulin. -      Basic metabolic panel; Future -     Hemoglobin A1c; Future -     Hemoglobin A1c -     Basic metabolic panel -     Continuous Blood Gluc Receiver (FREESTYLE LIBRE 14 DAY READER) DEVI; 1 Act by Does not apply route daily. -     insulin glargine (LANTUS SOLOSTAR) 100 UNIT/ML Solostar Pen; Inject 30 Units into the skin daily as needed (blood sugar over 200). -     Continuous Blood Gluc Sensor (FREESTYLE LIBRE 2 SENSOR) MISC; 1 Act by Does not apply route daily. -     Insulin Pen Needle 32G X 6 MM MISC; 1 Act by Does not apply route daily.  ADENOCARCINOMA, PROSTATE, GLEASON GRADE 6- His PSA is nearly undetectable.  This is a good sign that there is no recurrence of prostate cancer. -     PSA; Future -     PSA  Stage 3b chronic kidney disease ([place])- His renal function has declined and he has worsening proteinuria.  Will discontinue Metformin.  Will avoid nephrotoxic agents.  I have asked him to have a follow-up with nephrology. -     Basic metabolic panel; Future -     Basic metabolic panel -     Ambulatory referral to Nephrology  Hyperlipidemia with target LDL less than 100- He has achieved his LDL goal is doing well on the statin. -     Lipid panel; Future -     Hepatic function panel; Future -     Hepatic function panel -     Lipid panel  Encounter for general adult medical examination with abnormal findings- Exam completed, labs reviewed, vaccines reviewed and updated, cancer screenings are up-to-date, patient education was given.  Deficiency anemia- His anemia has improved some.  I will screen him for vitamin deficiencies.  This is likely caused by the renal insufficiency. -     CBC with Differential/Platelet; Future -     Reticulocytes; Future -     Iron; Future -     Vitamin B12; Future -     Vitamin B1; Future -     Ferritin; Future -     Folate; Future -     Vitamin B1 -     Folate -     Ferritin -     Vitamin B12 -     Iron -     Reticulocytes -     CBC with  Differential/Platelet  OAB (overactive bladder) -     Discontinue: Vibegron (GEMTESA) 75 MG TABS; Take 1 tablet by mouth daily. -     solifenacin (VESICARE) 5 MG tablet; Take 1 tablet (5 mg total) by mouth daily.  Tinnitus aurium, bilateral -     Ambulatory referral to ENT  Type 2 diabetes mellitus with complication, with long-term current use of insulin (HCC) -     Continuous Blood Gluc Receiver (FREESTYLE LIBRE 14 DAY READER) DEVI; 1 Act by Does not apply route daily. -     insulin glargine (LANTUS SOLOSTAR) 100 UNIT/ML Solostar Pen; Inject 30 Units into the skin daily as needed (blood sugar over 200). -     Continuous Blood Gluc Sensor (FREESTYLE LIBRE 2 SENSOR) MISC; 1 Act by Does not apply route daily. -     Insulin Pen Needle 32G X 6 MM MISC; 1 Act by Does not apply route daily.   I have discontinued Otila Back. [place]'s NovoFine, indapamide, Tetrahydrozoline HCl (VISINE OP), Janumet XR, diltiazem, traMADol, and Gemtesa. I have also changed his Lantus SoloStar. Additionally, I am having him start on solifenacin, FreeStyle Libre 2 Sensor, and Insulin Pen Needle. Lastly, I am having him maintain his tamsulosin, cholecalciferol, ONE TOUCH ULTRA TEST, ferrous sulfate, atorvastatin, gabapentin, gemfibrozil, aspirin EC, hydrOXYzine, methocarbamol, docusate sodium, acetaminophen, clotrimazole-betamethasone, FreeStyle Libre 14 Day Reader, and amLODipine.  Meds ordered this encounter  Medications   DISCONTD: Vibegron (GEMTESA) 75 MG TABS    Sig: Take 1 tablet by mouth daily.    Dispense:  90 tablet    Refill:  1   solifenacin (VESICARE) 5 MG tablet    Sig: Take 1 tablet (5 mg total) by mouth daily.    Dispense:  90 tablet    Refill:  1   Continuous Blood Gluc Receiver (FREESTYLE LIBRE 14 DAY READER) DEVI    Sig: 1 Act by Does not apply route daily.    Dispense:  2 each    Refill:  5   insulin glargine (LANTUS SOLOSTAR) 100 UNIT/ML Solostar Pen    Sig: Inject 30 Units into the skin  daily as needed (blood sugar over 200).    Dispense:  9 mL    Refill:  1    DX Code Needed  .   Continuous Blood Gluc Sensor (FREESTYLE LIBRE 2 SENSOR) MISC    Sig: 1 Act by Does not apply route daily.    Dispense:  2 each    Refill:  5   amLODipine (NORVASC) 5 MG tablet    Sig: Take 1 tablet (5 mg total) by mouth daily.    Dispense:  90 tablet    Refill:  1   Insulin Pen Needle 32G X 6 MM MISC    Sig: 1  Act by Does not apply route daily.    Dispense:  100 each    Refill:  1    In addition to time spent on CPE, I spent 50 minutes in preparing to see the patient by review of recent labs, imaging and procedures, obtaining and reviewing separately obtained history, communicating with the patient and family or caregiver, ordering medications, tests or procedures, and documenting clinical information in the EHR including the differential Dx, treatment, and any further evaluation and other management of 1. Essential hypertension 2. Type II diabetes mellitus with manifestations ([place]) 3. ADENOCARCINOMA, PROSTATE, GLEASON GRADE 6 4. Stage 3b chronic kidney disease ([place]) 5. Hyperlipidemia with target LDL less than 100 6. Deficiency anemia 7. OAB (overactive bladder) 8. Tinnitus aurium, bilateral 9. Type 2 diabetes mellitus with complication, with long-term current use of insulin ([place])      Follow-up: Return in about 3 months (around 08/18/2020).  Scarlette Calico, MD

## 2020-05-23 MED ORDER — AMLODIPINE BESYLATE 5 MG PO TABS: 5.0000 mg | ORAL_TABLET | Freq: Every day | ORAL | 1 refills | Status: DC

## 2020-05-23 MED ORDER — LANTUS SOLOSTAR 100 UNIT/ML ~~LOC~~ SOPN: 30.0000 [IU] | PEN_INJECTOR | Freq: Every day | SUBCUTANEOUS | 1 refills | Status: DC | PRN

## 2020-05-23 MED ORDER — FREESTYLE LIBRE 2 SENSOR MISC
1.0000 | Freq: Every day | 5 refills | Status: DC
Start: 2020-05-23 — End: 2021-06-03

## 2020-05-23 MED ORDER — INSULIN PEN NEEDLE 32G X 6 MM MISC: 1.0000 | Freq: Every day | 1 refills | Status: DC

## 2020-05-23 MED ORDER — FREESTYLE LIBRE 14 DAY READER DEVI: 1.0000 | Freq: Every day | 5 refills | Status: DC

## 2020-05-25 ENCOUNTER — Encounter: Payer: Self-pay | Admitting: *Deleted

## 2020-05-25 ENCOUNTER — Other Ambulatory Visit: Payer: Self-pay | Admitting: *Deleted

## 2020-05-25 LAB — VITAMIN B1: Vitamin B1 (Thiamine): 24 nmol/L (ref 8–30)

## 2020-05-25 NOTE — Patient Outreach (Signed)
[facility]) Care Management  [place]  05/27/2020   Antonio Adams 1946/04/05 [id number]  Referral Date: 05/24/2020 Referral Source: MD office Referral Reason: declining renal fxn  Insurance: [place] attempt #1, successful.  Identity verified.  This care manager introduced self and stated purpose of call.  [facility] care management services explained.    Social: Member reports living alone and independent in all ADL/IADL's, has sister that lives in [place].  She is the closest relative.  He is able to run his own errands and cook/clean independently.  State does monitor his diet, eating lots of veggies, limits eating out.    Conditions: Per chart, has history of HTN, OSA, GERD, DM (current A1C - 7), Spinal stenosis, prostate cancer, CKD, and Renal cancer.  He monitors blood sugar daily, report reading today was 127.  State highest over the last month was 217 on 12/29.  Report it is usually in the low 100's.  He does not take his insulin daily, stating does not feel he need it that often.  He only takes it if his blood sugars are greater than 200.    Medications:  Reviewed with member, denies taking Amlodipine currently.  State he was not aware that it was prescribed but the pharmacy did call to say it was ready.  He was reluctant about using due to lack of communication from provider when ordering.  Educated on use of meds in effort to manage blood pressure while protecting kidney function, agrees to take.  Denies need for medication assistance, obtains them from the [place].     Appointments: Was last seen by PCP on 1/6, plan for follow up in 3 months.  Does have follow up with urology the end of this month.   Denies need for transportation resources, will be able to drive self to appointments.  Advance Directives:  Does not have advanced directive/POA currently, but he is interested in receiving information.    Consent: Member gives consent to engage in [facility]  services.  Denies any urgent concerns, encouraged to contact this care manager with questions.     Encounter Medications:  Outpatient Encounter Medications as of 05/25/2020  Medication Sig  . acetaminophen (TYLENOL) 500 MG tablet Take 2 tablets (1,000 mg total) by mouth every 6 (six) hours.  Marland Kitchen amLODipine (NORVASC) 5 MG tablet Take 1 tablet (5 mg total) by mouth daily.  Marland Kitchen aspirin EC 81 MG tablet Take 81 mg by mouth daily. Swallow whole.  Marland Kitchen atorvastatin (LIPITOR) 40 MG tablet Take 1 tablet (40 mg total) by mouth daily.  . cholecalciferol (VITAMIN D) 1000 units tablet Take 1,000 Units by mouth daily.  . clotrimazole-betamethasone (LOTRISONE) cream APPLY TO AFFECTED AREA TWICE A DAY  . Continuous Blood Gluc Receiver (FREESTYLE LIBRE 14 DAY READER) DEVI 1 Act by Does not apply route daily.  . Continuous Blood Gluc Sensor (FREESTYLE LIBRE 2 SENSOR) MISC 1 Act by Does not apply route daily.  Marland Kitchen docusate sodium (COLACE) 100 MG capsule Take 1 capsule (100 mg total) by mouth 2 (two) times daily.  . ferrous sulfate 325 (65 FE) MG tablet Take 1 tablet (325 mg total) by mouth 2 (two) times daily. Patient needs office visit for further refills (Patient taking differently: Take 325 mg by mouth daily with breakfast.)  . gabapentin (NEURONTIN) 100 MG capsule Take 100 mg by mouth at bedtime.  Marland Kitchen gemfibrozil (LOPID) 600 MG tablet Take 600 mg by mouth 2 (two) times daily before  a meal.  . hydrOXYzine (ATARAX/VISTARIL) 25 MG tablet Take 25 mg by mouth 3 (three) times daily as needed for anxiety, itching or nausea.   . insulin glargine (LANTUS SOLOSTAR) 100 UNIT/ML Solostar Pen Inject 30 Units into the skin daily as needed (blood sugar over 200).  . Insulin Pen Needle 32G X 6 MM MISC 1 Act by Does not apply route daily.  . ONE TOUCH ULTRA TEST test strip USE 2 (TWO) TIMES DAILY. AND LANCETS 2/DAY 250.03  . solifenacin (VESICARE) 5 MG tablet Take 1 tablet (5 mg total) by mouth daily.  . tamsulosin (FLOMAX) 0.4 MG CAPS  capsule TAKE 1 CAPSULE (0.4 MG TOTAL) BY MOUTH DAILY. (Patient taking differently: Take 0.4 mg by mouth daily.)  . methocarbamol (ROBAXIN) 750 MG tablet Take 750 mg by mouth 4 (four) times daily. (Patient not taking: Reported on 05/25/2020)   No facility-administered encounter medications on file as of 05/25/2020.    Functional Status:  In your present state of health, do you have any difficulty performing the following activities: 02/28/2020 02/16/2020  Hearing? N N  Vision? N N  Difficulty concentrating or making decisions? N N  Walking or climbing stairs? N N  Dressing or bathing? N N  Doing errands, shopping? N N  Some recent data might be hidden    Fall/Depression Screening: Fall Risk  07/14/2019 12/26/2018 05/29/2018  Falls in the past year? 0 0 0  Number falls in past yr: 0 0 0  Injury with Fall? 0 0 0  Follow up - Falls evaluation completed -   PHQ 2/9 Scores 05/25/2020 05/20/2020 04/02/2019 01/07/2018 01/03/2018 08/02/2017 07/30/2016  PHQ - 2 Score 0 0 0 0 6 1 0  PHQ- 9 Score - - - - 13 6 -    Assessment:  Goals Addressed            This Visit's Progress   . [facility] - Make and Keep All Appointments       Timeframe:  Short-Term Goal Priority:  Medium Start Date:     05/25/2020                        Expected End Date:      06/25/2020                 Follow Up Date 06/08/2020   - ask family or friend for a ride - call to cancel if needed - keep a calendar with appointment dates    Why is this important?    Part of staying healthy is seeing the doctor for follow-up care.   If you forget your appointments, there are some things you can do to stay on track.    Notes:     . [facility] - Track and Manage My Symptoms-Chronic Kidney       Timeframe:  Long-Range Goal Priority:  Medium Start Date:     05/25/2020                        Expected End Date:     07/23/2020                  Follow Up Date 06/08/2020   - begin a symptom diary - bring symptom diary to all appointments - keep  all lab appointments - make shared treatment decisions with doctor - take medicine as prescribed - watch for early signs of feeling worse  Why is this important?    Keeping track of symptoms can help you feel the best. It also helps the doctor stay on top of any changes to the disease. It may also help keep your disease from getting worse.   Taking simple steps can help you cope with symptoms like feeling very tired or itchy skin.     Notes:        Plan:  Follow-up:  Patient agrees to Care Plan and Follow-up.  Will send education regarding kidney disease.  Will follow up within the next 2 weeks on medication management.  Will send information on Advanced Directive.  [name], [place], MSN [place] [phone]

## 2020-05-28 NOTE — Patient Instructions (Signed)
Goals Addressed            This Visit's Progress   . [facility] - Make and Keep All Appointments       Timeframe:  Short-Term Goal Priority:  Medium Start Date:     05/25/2020                        Expected End Date:      06/25/2020                 Follow Up Date 06/08/2020   - ask family or friend for a ride - call to cancel if needed - keep a calendar with appointment dates    Why is this important?    Part of staying healthy is seeing the doctor for follow-up care.   If you forget your appointments, there are some things you can do to stay on track.    Notes:     . [facility] - Track and Manage My Symptoms-Chronic Kidney       Timeframe:  Long-Range Goal Priority:  Medium Start Date:     05/25/2020                        Expected End Date:     07/23/2020                  Follow Up Date 06/08/2020   - begin a symptom diary - bring symptom diary to all appointments - keep all lab appointments - make shared treatment decisions with doctor - take medicine as prescribed - watch for early signs of feeling worse    Why is this important?    Keeping track of symptoms can help you feel the best. It also helps the doctor stay on top of any changes to the disease. It may also help keep your disease from getting worse.   Taking simple steps can help you cope with symptoms like feeling very tired or itchy skin.     Notes:        Critical care medicine: Principles of diagnosis and management in the adult (4th ed., pp. 0093-8182). Saunders."> Miller's anesthesia (8th ed., pp. 232-250). Saunders.">  Advance Directive  Advance directives are legal documents that allow you to make decisions about your health care and medical treatment in case you become unable to communicate for yourself. Advance directives let your wishes be known to family, friends, and health care providers. Discussing and writing advance directives should happen over time rather than all at once. Advance directives can be  changed and updated at any time. There are different types of advance directives, such as:  Medical power of attorney.  Living will.  Do not resuscitate (DNR) order or do not attempt resuscitation (DNAR) order. Health care proxy and medical power of attorney A health care proxy is also called a health care agent. This person is appointed to make medical decisions for you when you are unable to make decisions for yourself. Generally, people ask a trusted friend or family member to act as their proxy and represent their preferences. Make sure you have an agreement with your trusted person to act as your proxy. A proxy may have to make a medical decision on your behalf if your wishes are not known. A medical power of attorney, also called a durable power of attorney for health care, is a legal document that names your health care proxy. Depending  on the laws in your state, the document may need to be:  Signed.  Notarized.  Dated.  Copied.  Witnessed.  Incorporated into your medical record. You may also want to appoint a trusted person to manage your money in the event you are unable to do so. This is called a durable power of attorney for finances. It is a separate legal document from the durable power of attorney for health care. You may choose your health care proxy or someone different to act as your agent in money matters. If you do not appoint a proxy, or there is a concern that the proxy is not acting in your best interest, a court may appoint a guardian to act on your behalf. Living will A living will is a set of instructions that state your wishes about medical care when you cannot express them yourself. Health care providers should keep a copy of your living will in your medical record. You may want to give a copy to family members or friends. To alert caregivers in case of an emergency, you can place a card in your wallet to let them know that you have a living will and where they can  find it. A living will is used if you become:  Terminally ill.  Disabled.  Unable to communicate or make decisions. The following decisions should be included in your living will:  To use or not to use life support equipment, such as dialysis machines and breathing machines (ventilators).  Whether you want a DNR or DNAR order. This tells health care providers not to use cardiopulmonary resuscitation (CPR) if breathing or heartbeat stops.  To use or not to use tube feeding.  To be given or not to be given food and fluids.  Whether you want comfort (palliative) care when the goal becomes comfort rather than a cure.  Whether you want to donate your organs and tissues. A living will does not give instructions for distributing your money and property if you should pass away. DNR or DNAR A DNR or DNAR order is a request not to have CPR in the event that your heart stops beating or you stop breathing. If a DNR or DNAR order has not been made and shared, a health care provider will try to help any patient whose heart has stopped or who has stopped breathing. If you plan to have surgery, talk with your health care provider about how your DNR or DNAR order will be followed if problems occur. What if I do not have an advance directive? Some states assign family decision makers to act on your behalf if you do not have an advance directive. Each state has its own laws about advance directives. You may want to check with your health care provider, attorney, or state representative about the laws in your state. Summary  Advance directives are legal documents that allow you to make decisions about your health care and medical treatment in case you become unable to communicate for yourself.  The process of discussing and writing advance directives should happen over time. You can change and update advance directives at any time.  Advance directives may include a medical power of attorney, a living  will, and a DNR or DNAR order. This information is not intended to replace advice given to you by your health care provider. Make sure you discuss any questions you have with your health care provider. Document Revised: 02/03/2020 Document Reviewed: 02/03/2020 Elsevier Patient Education  2021 Elsevier  Inc.  

## 2020-06-01 ENCOUNTER — Other Ambulatory Visit: Payer: Self-pay | Admitting: Internal Medicine

## 2020-06-01 DIAGNOSIS — I10 Essential (primary) hypertension: Secondary | ICD-10-CM

## 2020-06-02 DIAGNOSIS — D631 Anemia in chronic kidney disease: Secondary | ICD-10-CM | POA: Diagnosis not present

## 2020-06-02 DIAGNOSIS — R809 Proteinuria, unspecified: Secondary | ICD-10-CM | POA: Diagnosis not present

## 2020-06-02 DIAGNOSIS — N183 Chronic kidney disease, stage 3 unspecified: Secondary | ICD-10-CM | POA: Diagnosis not present

## 2020-06-02 DIAGNOSIS — N2581 Secondary hyperparathyroidism of renal origin: Secondary | ICD-10-CM | POA: Diagnosis not present

## 2020-06-02 DIAGNOSIS — N184 Chronic kidney disease, stage 4 (severe): Secondary | ICD-10-CM | POA: Diagnosis not present

## 2020-06-02 DIAGNOSIS — E1122 Type 2 diabetes mellitus with diabetic chronic kidney disease: Secondary | ICD-10-CM | POA: Diagnosis not present

## 2020-06-02 DIAGNOSIS — E785 Hyperlipidemia, unspecified: Secondary | ICD-10-CM | POA: Diagnosis not present

## 2020-06-02 DIAGNOSIS — D509 Iron deficiency anemia, unspecified: Secondary | ICD-10-CM | POA: Diagnosis not present

## 2020-06-07 ENCOUNTER — Other Ambulatory Visit: Payer: Self-pay | Admitting: *Deleted

## 2020-06-07 DIAGNOSIS — C641 Malignant neoplasm of right kidney, except renal pelvis: Secondary | ICD-10-CM | POA: Diagnosis not present

## 2020-06-07 NOTE — Patient Outreach (Signed)
[facility]) Care Management  06/07/2020  JHADEN PIZZUTO 1946/04/21 [id number]   Outgoing call placed to member to complete initial assessment.  State he has been doing well, confirms that he has started Amlodipine.  Report blood pressure is controlled, not at home currently so unable to provide specific numbers.  He does state it has ranged 130/140's/80s.  He has repeat CT scan for kidney cancer today, will follow up with urologist next week.  Denies any urgent concerns, encouraged to contact this care manager with questions.  Agrees to follow up within the next month.  Goals Addressed            This Visit's Progress   . [facility] - Make and Keep All Appointments   On track    Timeframe:  Short-Term Goal Priority:  Medium Start Date:     05/25/2020                        Expected End Date:      06/25/2020                 Follow Up Date 06/08/2020   - ask family or friend for a ride - call to cancel if needed - keep a calendar with appointment dates    Why is this important?    Part of staying healthy is seeing the doctor for follow-up care.   If you forget your appointments, there are some things you can do to stay on track.    Notes:     . [facility] - Track and Manage My Symptoms-Chronic Kidney   On track    Timeframe:  Long-Range Goal Priority:  Medium Start Date:     05/25/2020                        Expected End Date:     07/23/2020                  Follow Up Date 06/08/2020   - begin a symptom diary - bring symptom diary to all appointments - keep all lab appointments - make shared treatment decisions with doctor - take medicine as prescribed - watch for early signs of feeling worse    Why is this important?    Keeping track of symptoms can help you feel the best. It also helps the doctor stay on top of any changes to the disease. It may also help keep your disease from getting worse.   Taking simple steps can help you cope with symptoms like feeling  very tired or itchy skin.     Notes:       [name], RN, MSN [place] [phone]

## 2020-06-08 ENCOUNTER — Ambulatory Visit: Payer: Medicare Other | Admitting: *Deleted

## 2020-06-10 DIAGNOSIS — Z85528 Personal history of other malignant neoplasm of kidney: Secondary | ICD-10-CM | POA: Diagnosis not present

## 2020-06-10 DIAGNOSIS — M47816 Spondylosis without myelopathy or radiculopathy, lumbar region: Secondary | ICD-10-CM | POA: Diagnosis not present

## 2020-06-10 DIAGNOSIS — C641 Malignant neoplasm of right kidney, except renal pelvis: Secondary | ICD-10-CM | POA: Diagnosis not present

## 2020-06-10 DIAGNOSIS — K7689 Other specified diseases of liver: Secondary | ICD-10-CM | POA: Diagnosis not present

## 2020-06-14 DIAGNOSIS — C7971 Secondary malignant neoplasm of right adrenal gland: Secondary | ICD-10-CM | POA: Diagnosis not present

## 2020-06-14 DIAGNOSIS — C641 Malignant neoplasm of right kidney, except renal pelvis: Secondary | ICD-10-CM | POA: Diagnosis not present

## 2020-06-17 ENCOUNTER — Other Ambulatory Visit: Payer: Self-pay | Admitting: Internal Medicine

## 2020-06-17 DIAGNOSIS — I10 Essential (primary) hypertension: Secondary | ICD-10-CM

## 2020-06-28 ENCOUNTER — Other Ambulatory Visit: Payer: Self-pay | Admitting: Urology

## 2020-06-28 DIAGNOSIS — K769 Liver disease, unspecified: Secondary | ICD-10-CM

## 2020-07-01 ENCOUNTER — Other Ambulatory Visit: Payer: Self-pay | Admitting: Interventional Radiology

## 2020-07-01 ENCOUNTER — Encounter: Payer: Self-pay | Admitting: *Deleted

## 2020-07-01 ENCOUNTER — Other Ambulatory Visit: Payer: Self-pay

## 2020-07-01 ENCOUNTER — Ambulatory Visit
Admission: RE | Admit: 2020-07-01 | Discharge: 2020-07-01 | Disposition: A | Discharge status: Home / Self Care | Payer: Medicare Other | Source: Ambulatory Visit | Attending: Urology | Admitting: Urology

## 2020-07-01 DIAGNOSIS — C641 Malignant neoplasm of right kidney, except renal pelvis: Secondary | ICD-10-CM | POA: Diagnosis not present

## 2020-07-01 DIAGNOSIS — E279 Disorder of adrenal gland, unspecified: Secondary | ICD-10-CM | POA: Diagnosis not present

## 2020-07-01 DIAGNOSIS — C787 Secondary malignant neoplasm of liver and intrahepatic bile duct: Secondary | ICD-10-CM

## 2020-07-01 DIAGNOSIS — C649 Malignant neoplasm of unspecified kidney, except renal pelvis: Secondary | ICD-10-CM

## 2020-07-01 DIAGNOSIS — K7689 Other specified diseases of liver: Secondary | ICD-10-CM | POA: Diagnosis not present

## 2020-07-01 DIAGNOSIS — K769 Liver disease, unspecified: Secondary | ICD-10-CM

## 2020-07-01 HISTORY — PX: IR RADIOLOGIST EVAL & MGMT: IMG5224

## 2020-07-01 NOTE — Consult Note (Signed)
Chief Complaint: Patient was consulted remotely today (TeleHealth) for metastatic papillary RCC at the request of Herrick,Benjamin W.    Referring Physician(s): Herrick,Benjamin W  History of Present Illness: Antonio Adams is a 75 y.o. male a history of a 6 cm right papillary renal cell carcinoma resected in April of 2017 (negative margins) but subsequently developed hepatic and adrenal metastatic disease. Biopsy proven metastatic RCC 8/21.   He underwent open partial hepatectomy, renal metastasectomy and adrenalectomy in October of 2021.  By his most recent imaging,(CT 06/10/20) he has persistent hepatic and adrenal/adrenal fossa lesions concerning for ongoing metastatic disease. His case was presented at the multi-disciplinary urological tumor board on 06/25/20 and he now presents to discuss percutaneous ablation.   He is in his usual state of health and denies active symptoms.    Past Medical History:  Diagnosis Date  . ADENOCARCINOMA, PROSTATE, GLEASON GRADE 6 12/21/2009   prostate cancer  . ALLERGIC RHINITIS 03/16/2007  . Allergy   . Anxiety   . Arthritis    back   . BACK PAIN WITH RADICULOPATHY 11/23/2008  . Cancer of kidney ([place])    partial right kidney removed  . CARPAL TUNNEL SYNDROME, BILATERAL 03/16/2007  . Chronic back pain   . Constipation    takes Senokot daily  . Depression    occasionally  . DIABETES MELLITUS, TYPE II 03/16/2007   takes Januvia and MEtformin daily  . GASTROINTESTINAL HEMORRHAGE, HX OF 03/16/2007  . GERD 03/16/2007   takes Omeprazole daily  . Glaucoma    mild - no eye drops  . Hemorrhoids   . History of colon polyps   . HYPERLIPIDEMIA 03/16/2007   takes Crestor daily  . HYPERTENSION 03/16/2007   takes Diltiazem and Lisinopril daily   . Kidney cancer, primary, with metastasis from kidney to other site [facility])   . LEG CRAMPS 05/22/2007  . Nocturia   . Overactive bladder   . PARESTHESIA 05/21/2007  . Proctitis   . Rectal bleeding     Dr.Norins has explained its from the Radiation that he has received  . Rectal bleeding   . Sleep apnea    uses CPAP nightly  . Stomach cancer ([place])   . Urinary frequency    takes Toviaz daily    Past Surgical History:  Procedure Laterality Date  . ADRENALECTOMY Right 02/26/2020   Procedure: OPEN RIGHT RENAL CELL METASTATECTOMY AND  ADRENALECTOMY;  Surgeon: [name], MD;  Location: [facility];  Service: Urology;  Laterality: Right;  . [place], 2008   repeat surgery; ESI '08  . CARPAL TUNNEL RELEASE     bilateral  . CERVICAL FUSION    . COLONOSCOPY    . colonosocpy    . ESOPHAGOGASTRODUODENOSCOPY     ED  . FLEXIBLE SIGMOIDOSCOPY N/A 01/08/2017   Procedure: FLEXIBLE SIGMOIDOSCOPY;  Surgeon: Irene Shipper, MD;  Location: [facility] ENDOSCOPY;  Service: Endoscopy;  Laterality: N/A;  . HOT HEMOSTASIS N/A 01/08/2017   Procedure: HOT HEMOSTASIS (ARGON PLASMA COAGULATION/BICAP);  Surgeon: Irene Shipper, MD;  Location: [facility] ENDOSCOPY;  Service: Endoscopy;  Laterality: N/A;  . IR RADIOLOGIST EVAL & MGMT  12/17/2019  . LUMBAR LAMINECTOMY  10/30/2011   Procedure: MICRODISCECTOMY LUMBAR LAMINECTOMY;  Surgeon: Jessy Oto, MD;  Location: [facility];  Service: Orthopedics;  Laterality: Right;  Right L4-5 and L5-S1 Microdiscectomy  . LUMBAR LAMINECTOMY  06/17/2012   Procedure: MICRODISCECTOMY LUMBAR LAMINECTOMY;  Surgeon: Jessy Oto, MD;  Location[facility]  OR;  Service: Orthopedics;  Laterality: N/A;  Right L5-S1 microdiscectomy  . OPEN PARTIAL HEPATECTOMY  N/A 02/26/2020   Procedure: OPEN PARTIAL HEPATECTOMY, CHOLECYSTECTOMY;  Surgeon: Stark Klein, MD;  Location: [facility];  Service: General;  Laterality: N/A;  . POLYPECTOMY    . PROSTATE SURGERY  march 2012   seed implant  . ROBOTIC ASSITED PARTIAL NEPHRECTOMY Right 09/08/2015   Procedure: XI ROBOTIC ASSITED RIGHT PARTIAL NEPHRECTOMY;  Surgeon: [name], MD;  Location: [facility];  Service: Urology;  Laterality: Right;  Clamp on: 1033 Clamp  off: 1103 Total Clamp Time: 30 minutes  . SHOULDER ARTHROSCOPY WITH SUBACROMIAL DECOMPRESSION Left 01/20/2016   Procedure: LEFT SHOULDER ARTHROSCOPY WITH EXTENSIVE  DEBRIDEMENT, ACROMIOPLASTY;  Surgeon: Ninetta Lights, MD;  Location: [facility];  Service: Orthopedics;  Laterality: Left;  . SHOULDER SURGERY  LFT  . Stress Cardiolite  12/24/2003   negative for ischemia  . UPPER ESOPHAGEAL ENDOSCOPIC ULTRASOUND (EUS)  04/18/2016   [facility] hospital  . UPPER GASTROINTESTINAL ENDOSCOPY    . 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 Authorizing Provider  acetaminophen (TYLENOL) 500 MG tablet Take 2 tablets (1,000 mg total) by mouth every 6 (six) hours. 03/03/20   [name], MD  amLODipine (NORVASC) 5 MG tablet Take 1 tablet (5 mg total) by mouth daily. 05/23/20   [name], MD  aspirin EC 81 MG tablet Take 81 mg by mouth daily. Swallow whole.    [provider]  atorvastatin (LIPITOR) 40 MG tablet Take 1 tablet (40 mg total) by mouth daily. 12/20/19   [name], MD  cholecalciferol (VITAMIN D) 1000 units tablet Take 1,000 Units by mouth daily.    [provider]  clotrimazole-betamethasone (LOTRISONE) cream APPLY TO AFFECTED AREA TWICE A DAY 04/14/20   [name], MD  Continuous Blood Gluc Receiver (FREESTYLE LIBRE 14 DAY READER) DEVI 1 Act by Does not apply route daily. 05/23/20   [name], MD  Continuous Blood Gluc Sensor (FREESTYLE LIBRE 2 SENSOR) MISC 1 Act by Does not apply route daily. 05/23/20   [name], MD  docusate sodium (COLACE) 100 MG capsule Take 1 capsule (100 mg total) by mouth 2 (two) times daily. 03/03/20   [name], MD  ferrous sulfate 325 (65 FE) MG tablet Take 1 tablet (325 mg total) by mouth 2 (two) times daily. Patient needs office  visit for further refills Patient taking differently: Take 325 mg by mouth daily with breakfast. 02/24/19   Irene Shipper, MD  gabapentin (NEURONTIN) 100 MG capsule Take 100 mg by mouth at bedtime.    [provider]  gemfibrozil (LOPID) 600 MG tablet Take 600 mg by mouth 2 (two) times daily before a meal.    [provider]  hydrOXYzine (ATARAX/VISTARIL) 25 MG tablet Take 25 mg by mouth 3 (three) times daily as needed for anxiety, itching or nausea.     [provider]  insulin glargine (LANTUS SOLOSTAR) 100 UNIT/ML Solostar Pen Inject 30 Units into the skin daily as needed (blood sugar over 200). 05/23/20   [name], MD  Insulin Pen Needle 32G X 6 MM MISC 1 Act by Does not apply route daily. 05/23/20   [name], MD  methocarbamol (ROBAXIN) 750 MG  tablet Take 750 mg by mouth 4 (four) times daily. Patient not taking: Reported on 05/25/2020    [provider]  ONE TOUCH ULTRA TEST test strip USE 2 (TWO) TIMES DAILY. AND LANCETS 2/DAY 250.03 11/05/16   [name], MD  solifenacin (VESICARE) 5 MG tablet Take 1 tablet (5 mg total) by mouth daily. 05/20/20   [name], MD  tamsulosin (FLOMAX) 0.4 MG CAPS capsule TAKE 1 CAPSULE (0.4 MG TOTAL) BY MOUTH DAILY. Patient taking differently: Take 0.4 mg by mouth daily. 01/20/14   [name], MD     Family History  Problem Relation Age of Onset  . Cancer Sister 30       Cancer, unsure type  . Hypertension Other   . Colon cancer Neg Hx   . Esophageal cancer Neg Hx   . Stomach cancer Neg Hx   . Diabetes Neg Hx   . Colon polyps Neg Hx   . Rectal cancer Neg Hx     Social History   Socioeconomic History  . Marital status: Widowed    Spouse name: Deneise Lever  . Number of children: 3  . Years of education: 12+  . Highest education level: Not on file  Occupational History  . 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 Smoking  status: Never Smoker  . Smokeless tobacco: Never Used  Vaping Use  . Vaping Use: Never used  Substance and Sexual Activity  . Alcohol use: Not Currently    Alcohol/week: 0.0 standard drinks    Comment: occasional  . Drug use: No  . Sexual activity: Not Currently    Partners: Female  Other Topics Concern  . Not on file  Social History Narrative   Married-divorced '89, remarried '96. 2 sons, 1 daughter. 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 He wishes to be a full resuscitation candidate.       Social Determinants of Health   Financial Resource Strain: Not on file  Food Insecurity: No Food Insecurity  . Worried About Charity fundraiser in the Last Year: Never true  . Ran Out of Food in the Last Year: Never true  Transportation Needs: No Transportation Needs  . Lack of Transportation (Medical): No  . Lack of Transportation (Non-Medical): No  Physical Activity: Not on file  Stress: Not on file  Social Connections: Not on file    ECOG Status: 0 - Asymptomatic  Review of Systems  Review of Systems: A 12 point ROS discussed and pertinent positives are indicated in the HPI above.  All other systems are negative.  Physical Exam No direct physical exam was performed (except for noted visual exam findings with Video Visits).   Vital Signs: There were no vitals taken for this visit.  Imaging: No results found.  Labs:  CBC: Recent Labs    03/01/20 0316 03/02/20 0444 03/03/20 0452 05/20/20 1605  WBC 6.4 5.7 6.0 3.2*  HGB 8.5* 9.1* 8.4* 9.7*  HCT 25.1* 26.4* 25.6* 27.9*  PLT 101* 127* 127* 221.0    COAGS: Recent Labs    01/05/20 0916 02/16/20 1257 02/26/20 1526  INR 1.0 0.9 1.3*    BMP: Recent Labs    11/21/19 1849 12/18/19 1425 02/16/20 1257 02/26/20 1224 02/29/20 0946 03/01/20 0316 03/02/20 0444 03/03/20 0452 05/20/20 1605  NA 134* 139 136   < > 134*  134* 136 136 138  K 4.7 5.1 4.8   < >  4.0 3.8 3.9 4.0 4.5  CL 105 107 107   < > 104 104 105 104 106  CO2 21* 27 22   < > 18* 20* 19* 20* 26  GLUCOSE 107* 93 95   < > 131* 136* 128* 129* 194*  BUN 31* 24 28*   < > 39* 37* 39* 42* 30*  CALCIUM 9.6 10.0 9.7   < > 8.5* 8.5* 9.4 9.6 9.9  CREATININE 2.17* 2.11* 2.22*   < > 3.15* 3.09* 2.74* 2.96* 2.97*  GFRNONAA 29* 30* 28*   < > 19* 19* 22* 20*  --   GFRAA 34* 35* 33*  --   --   --   --   --   --    < > = values in this interval not displayed.    LIVER FUNCTION TESTS: Recent Labs    02/16/20 1257 05/20/20 1605  BILITOT 0.4 0.3  AST 21 13  ALT 20 12  ALKPHOS 92 121*  PROT 7.1 6.7  ALBUMIN 3.8 4.1    TUMOR MARKERS: No results for input(s): AFPTM, CEA, CA199, CHROMGRNA in the last 8760 hours.  Assessment and Plan:  75 year-old male with a history of a 6 cm right papillary renal cell carcinoma resected in April of 2017 but now with hepatic and adrenal metastatic disease.  He underwent open partial hepatectomy, renal metastasectomy and adrenalectomy in October of 2021.  By his most recent imaging, he has persistent hepatic and adrenal/adrenal fossa lesions concerning for metastatic disease.   We discussed percutaneous biopsy and thermal ablation as a more minimally invasive alternative to repeat open surgery.  His liver and adrenal/adrenal fossa lesions are suitable and relatively safe in location for ablation.  His disease burden is nearing the limits of MWA in terms of size, but I do believe we have a high likelihood of establishing local control.    He understands and is interested in proceeding but would like 1 additional scan prior to the procedure.  I discussed MRI with him as this may give [place] more information even without gadolinium compared to unenhanced CT imaging alone. He agrees but notes he is claustrophobic and may need anxiolysis to get through the study.   1.) MRI abdomen without gad (poor GFR) to assess hepatic and adrenal fossa metastatic disease.  Please order  10 mg Valium PO x 1 to be taken 1 hour prior to MRI for anxiolysis.  2.) Schedule for MWA at [facility], preferably 1-2 weeks after the MRI.   3.) Follow-up phone call with Mr. Castorena after MRI to review results. This need not be on a formal clinic day as we don't want to delay his MWA.     Thank you for this interesting consult.  I greatly enjoyed meeting RYE DECOSTE and look forward to participating in their care.  A copy of this report was sent to the requesting provider on this date.  Electronically Signed: Criselda Peaches 07/01/2020, 11:49 AM   I spent a total of 40 Minutes  in remote  clinical consultation, greater than 50% of which was counseling/coordinating care for metastatic papillary renal cell carcinoma.    Visit type: Audio only (telephone). Audio (no video) only due to patient preference. Alternative for in-person consultation at [facility], [address] [place], [place], [place]. This visit type was conducted due to national recommendations for restrictions regarding the COVID-19 Pandemic (e.g.  social distancing).  This format is felt to be most appropriate for this patient at this time.  All issues noted in this document were discussed and addressed.

## 2020-07-02 ENCOUNTER — Other Ambulatory Visit: Payer: Self-pay | Admitting: *Deleted

## 2020-07-02 ENCOUNTER — Ambulatory Visit (HOSPITAL_COMMUNITY)
Admission: RE | Admit: 2020-07-02 | Discharge: 2020-07-02 | Disposition: A | Discharge status: Home / Self Care | Payer: Medicare Other | Source: Ambulatory Visit | Attending: Interventional Radiology | Admitting: Interventional Radiology

## 2020-07-02 ENCOUNTER — Other Ambulatory Visit: Payer: Self-pay

## 2020-07-02 DIAGNOSIS — C787 Secondary malignant neoplasm of liver and intrahepatic bile duct: Secondary | ICD-10-CM | POA: Insufficient documentation

## 2020-07-02 DIAGNOSIS — K7689 Other specified diseases of liver: Secondary | ICD-10-CM | POA: Diagnosis not present

## 2020-07-02 DIAGNOSIS — C649 Malignant neoplasm of unspecified kidney, except renal pelvis: Secondary | ICD-10-CM | POA: Insufficient documentation

## 2020-07-02 DIAGNOSIS — Q453 Other congenital malformations of pancreas and pancreatic duct: Secondary | ICD-10-CM | POA: Diagnosis not present

## 2020-07-02 DIAGNOSIS — E278 Other specified disorders of adrenal gland: Secondary | ICD-10-CM | POA: Diagnosis not present

## 2020-07-02 MED ORDER — GADOBUTROL 1 MMOL/ML IV SOLN
10.0000 mL | Freq: Once | INTRAVENOUS | Status: AC | PRN
  Administered 2020-07-02: 10 mL via INTRAVENOUS

## 2020-07-02 NOTE — Patient Outreach (Signed)
[facility]) Care Management  07/02/2020  Antonio Adams 08-28-1945 [id number]   Patient is being transitioned to Chronic Care Management with the primary provider office, case closed.  [name], [place], MSN [name] [phone]

## 2020-07-05 ENCOUNTER — Ambulatory Visit: Payer: Self-pay | Admitting: *Deleted

## 2020-07-09 ENCOUNTER — Other Ambulatory Visit (HOSPITAL_COMMUNITY): Payer: Self-pay | Admitting: Interventional Radiology

## 2020-07-09 DIAGNOSIS — K769 Liver disease, unspecified: Secondary | ICD-10-CM

## 2020-07-09 DIAGNOSIS — E279 Disorder of adrenal gland, unspecified: Secondary | ICD-10-CM

## 2020-07-12 ENCOUNTER — Other Ambulatory Visit: Payer: Self-pay

## 2020-07-12 ENCOUNTER — Encounter (HOSPITAL_COMMUNITY): Payer: Self-pay

## 2020-07-12 ENCOUNTER — Encounter (HOSPITAL_COMMUNITY)
Admission: RE | Admit: 2020-07-12 | Discharge: 2020-07-12 | Disposition: A | Discharge status: Home / Self Care | Payer: Medicare Other | Source: Ambulatory Visit | Attending: Interventional Radiology | Admitting: Interventional Radiology

## 2020-07-12 DIAGNOSIS — Z01812 Encounter for preprocedural laboratory examination: Secondary | ICD-10-CM | POA: Insufficient documentation

## 2020-07-12 LAB — CBC
HCT: 27.7 % — ABNORMAL LOW (ref 39.0–52.0)
Hemoglobin: 9.3 g/dL — ABNORMAL LOW (ref 13.0–17.0)
MCH: 29.3 pg (ref 26.0–34.0)
MCHC: 33.6 g/dL (ref 30.0–36.0)
MCV: 87.4 fL (ref 80.0–100.0)
Platelets: 215 10*3/uL (ref 150–400)
RBC: 3.17 MIL/uL — ABNORMAL LOW (ref 4.22–5.81)
RDW: 14.4 % (ref 11.5–15.5)
WBC: 3.3 10*3/uL — ABNORMAL LOW (ref 4.0–10.5)
nRBC: 0 % (ref 0.0–0.2)

## 2020-07-12 LAB — HEMOGLOBIN A1C
Hgb A1c MFr Bld: 7.6 % — ABNORMAL HIGH (ref 4.8–5.6)
Mean Plasma Glucose: 171.42 mg/dL

## 2020-07-12 LAB — BASIC METABOLIC PANEL
Anion gap: 9 (ref 5–15)
BUN: 34 mg/dL — ABNORMAL HIGH (ref 8–23)
CO2: 22 mmol/L (ref 22–32)
Calcium: 9.6 mg/dL (ref 8.9–10.3)
Chloride: 109 mmol/L (ref 98–111)
Creatinine, Ser: 3.54 mg/dL — ABNORMAL HIGH (ref 0.61–1.24)
GFR, Estimated: 17 mL/min — ABNORMAL LOW (ref >60)
Glucose, Bld: 189 mg/dL — ABNORMAL HIGH (ref 70–99)
Potassium: 4.4 mmol/L (ref 3.5–5.1)
Sodium: 140 mmol/L (ref 135–145)

## 2020-07-12 LAB — GLUCOSE, CAPILLARY: Glucose-Capillary: 180 mg/dL — ABNORMAL HIGH (ref 70–99)

## 2020-07-12 NOTE — Progress Notes (Signed)
Please enter orders for upcoming procedure 07-21-20.

## 2020-07-12 NOTE — Progress Notes (Addendum)
COVID Vaccine Completed:  x3 Date COVID Vaccine completed:  11-15-19, 12-06-19 Booster - 1-22 COVID vaccine manufacturer: [place]     PCP - Antonio Calico, MD Cardiologist - N/A  Chest x-ray - 02-29-20 Epic EKG - 12-18-19 Epic Stress Test -  ECHO -  Cardiac Cath -  Pacemaker/ICD device last checked: Telemetry monitor - 10-16-18 Epic  Sleep Study - sleep apnea CPAP - No, last wore 3 years ago  Fasting Blood Sugar - 113 to 213 Checks Blood Sugar - x 1 time a day  Blood Thinner Instructions: Aspirin Instructions:  ASA 81 mg Last Dose:  Anesthesia review:   Patient denies shortness of breath, fever, cough and chest pain at PAT appointment   Patient verbalized understanding of instructions that were given to them at the PAT appointment. Patient was also instructed that they will need to review over the PAT instructions again at home before surgery.

## 2020-07-12 NOTE — Patient Instructions (Addendum)
DUE TO COVID-19 ONLY ONE VISITOR IS ALLOWED TO COME WITH YOU AND STAY IN THE WAITING ROOM ONLY DURING PRE OP AND PROCEDURE.   IF YOU WILL BE ADMITTED INTO THE HOSPITAL YOU ARE ALLOWED ONLY TWO SUPPORT PEOPLE DURING VISITATION HOURS ONLY (10AM -8PM)   . The support person(s) may change daily. . The support person(s) must pass our screening, gel in and out, and wear a mask at all times, including in the patient's room. . Patients must also wear a mask when staff or their support person are in the room.  No visitors under the age of 63. Any visitor under the age of 10 must be accompanied by an adult.    COVID SWAB TESTING MUST BE COMPLETED ON:  Monday, 07-19-20 @ 2:45   [address] [place], [place] [address]  (Must self quarantine after testing. Follow instructions on handout.)        Your procedure is scheduled on:  Wednesday, 07-21-20    Report to [facility] Main  Entrance    Report to admitting at 10:00 AM   Call this number if you have problems the morning of surgery [phone]   Do not eat food or drink liquids :After Midnight.   Oral Hygiene is also important to reduce your risk of infection.                                    Remember - BRUSH YOUR TEETH THE MORNING OF SURGERY WITH YOUR REGULAR TOOTHPASTE   Do NOT smoke after Midnight   Take these medicines the morning of surgery with A SIP OF WATER:  Amlodipine, Atorvastatin, Gemfibrozil, Tamsulosin, Vesicare  DO NOT TAKE ANY ORAL DIABETIC MEDICATIONS DAY OF YOUR SURGERY  How to Manage Your Diabetes Before and After Surgery  Why is it important to control my blood sugar before and after surgery? . Improving blood sugar levels before and after surgery helps healing and can limit problems. . A way of improving blood sugar control is eating a healthy diet by: o  Eating less sugar and carbohydrates o  Increasing activity/exercise o  Talking with your doctor about reaching your blood sugar goals . High blood sugars  (greater than 180 mg/dL) can raise your risk of infections and slow your recovery, so you will need to focus on controlling your diabetes during the weeks before surgery. . Make sure that the doctor who takes care of your diabetes knows about your planned surgery including the date and location.  How do I manage my blood sugar before surgery? . Check your blood sugar at least 4 times a day, starting 2 days before surgery, to make sure that the level is not too high or low. o Check your blood sugar the morning of your surgery when you wake up and every 2 hours until you get to the Short Stay unit. . If your blood sugar is less than 70 mg/dL, you will need to treat for low blood sugar: o Do not take insulin. o Treat a low blood sugar (less than 70 mg/dL) with  cup of clear juice (cranberry or apple), 4 glucose tablets, OR glucose gel. o Recheck blood sugar in 15 minutes after treatment (to make sure it is greater than 70 mg/dL). If your blood sugar is not greater than 70 mg/dL on recheck, call [phone] for further instructions. . Report your blood sugar to the short  stay nurse when you get to Short Stay.  . If you are admitted to the hospital after surgery: o Your blood sugar will be checked by the staff and you will probably be given insulin after surgery (instead of oral diabetes medicines) to make sure you have good blood sugar levels. o The goal for blood sugar control after surgery is 80-180 mg/dL.   WHAT DO I DO ABOUT MY DIABETES MEDICATION?  Marland Kitchen Do not take oral diabetes medicines (pills) the morning of surgery.  . THE DAY BEFORE SURGERY:  Take 30 units of Insulin Glargine in the morning.  If administered in the evening or at night do 15 units.       . THE MORNING OF SURGERY:  Take 15 units of Insulin Glargine in the morning if blood sugar over 200.   Reviewed and Endorsed by [facility] Patient Education Committee, August 2015                               You may not have any  metal on your body including jewelry, and body piercings             Do not wear  lotions, powders, perfumes/cologne, or deodorant             Men may shave face and neck.   Do not bring valuables to the hospital. [place].   Contacts, dentures or bridgework may not be worn into surgery.   Bring small overnight bag day of surgery.      Please read over the following fact sheets you were given: IF YOU HAVE QUESTIONS ABOUT YOUR PRE OP INSTRUCTIONS PLEASE  CALL [facility] - Preparing for Surgery Before surgery, you can play an important role.  Because skin is not sterile, your skin needs to be as free of germs as possible.  You can reduce the number of germs on your skin by washing with CHG (chlorahexidine gluconate) soap before surgery.  CHG is an antiseptic cleaner which kills germs and bonds with the skin to continue killing germs even after washing. Please DO NOT use if you have an allergy to CHG or antibacterial soaps.  If your skin becomes reddened/irritated stop using the CHG and inform your nurse when you arrive at Short Stay. Do not shave (including legs and underarms) for at least 48 hours prior to the first CHG shower.  You may shave your face/neck.  Please follow these instructions carefully:  1.  Shower with CHG Soap the night before surgery and the  morning of surgery.  2.  If you choose to wash your hair, wash your hair first as usual with your normal  shampoo.  3.  After you shampoo, rinse your hair and body thoroughly to remove the shampoo.                             4.  Use CHG as you would any other liquid soap.  You can apply chg directly to the skin and wash.  Gently with a scrungie or clean washcloth.  5.  Apply the CHG Soap to your body ONLY FROM THE NECK DOWN.   Do   not use on face/ open  Wound or open sores. Avoid contact with eyes, ears mouth and   genitals (private  parts).                       Wash face,  Genitals (private parts) with your normal soap.             6.  Wash thoroughly, paying special attention to the area where your    surgery  will be performed.  7.  Thoroughly rinse your body with warm water from the neck down.  8.  DO NOT shower/wash with your normal soap after using and rinsing off the CHG Soap.                9.  Pat yourself dry with a clean towel.            10.  Wear clean pajamas.            11.  Place clean sheets on your bed the night of your first shower and do not  sleep with pets. Day of Surgery : Do not apply any lotions/deodorants the morning of surgery.  Please wear clean clothes to the hospital/surgery center.  FAILURE TO FOLLOW THESE INSTRUCTIONS MAY RESULT IN THE CANCELLATION OF YOUR SURGERY  PATIENT SIGNATURE_________________________________  NURSE SIGNATURE__________________________________  ________________________________________________________________________

## 2020-07-12 NOTE — Progress Notes (Addendum)
CBC, BMP and A1c sent to Dr. Laurence Ferrari to review.

## 2020-07-13 ENCOUNTER — Telehealth: Payer: Self-pay | Admitting: *Deleted

## 2020-07-13 NOTE — Telephone Encounter (Signed)
  Care Management   Outreach Note  07/13/2020 Name: Antonio Adams MRN: [id number] DOB: 1945/10/17  Referred by: [name], MD Reason for referral : Care Coordination (RN CM telephone outreach DM; HTN unsuccessful attempt)  An unsuccessful telephone outreach was attempted today. The patient was referred to the case management team for assistance with care management and care coordination.   Follow Up Plan: Telephone follow up appointment with care management team member scheduled for: August 02, 2020 at 10:00 am  Oneta Rack, RN, BSN, [employer] [phone]

## 2020-07-14 ENCOUNTER — Other Ambulatory Visit: Payer: Self-pay

## 2020-07-14 ENCOUNTER — Telehealth (HOSPITAL_COMMUNITY): Payer: Self-pay | Admitting: Interventional Radiology

## 2020-07-14 ENCOUNTER — Ambulatory Visit
Admission: RE | Admit: 2020-07-14 | Discharge: 2020-07-14 | Disposition: A | Discharge status: Home / Self Care | Payer: Medicare Other | Source: Ambulatory Visit | Attending: Interventional Radiology | Admitting: Interventional Radiology

## 2020-07-14 DIAGNOSIS — K769 Liver disease, unspecified: Secondary | ICD-10-CM

## 2020-07-14 DIAGNOSIS — E279 Disorder of adrenal gland, unspecified: Secondary | ICD-10-CM

## 2020-07-14 NOTE — Telephone Encounter (Signed)
Interventional Radiology Telephone Note  I spoke to [name] over the telephone today to review the results of his MRI which was performed on 07/02/2020.  His MRI shows a relatively stable burden of disease in the region of the right adrenal gland/adrenal fossa extending into the liver.  Additionally, there is a pericapsular lesion along the medial surface of segment 6 of the liver as well as a small probable metastatic implant at the posterior superior aspect of the liver capsule and the subdiaphragmatic space.  We again reviewed the percutaneous microwave ablation procedure including the risks and benefits.  He understands that this is a palliative therapy given the multifocality of his lesions.  Given his multiple prior surgeries, I believe this represents our best chance at achieving local control.  I did let him know that the lesion on most concerned about is the one high in the subdiaphragmatic space.  This may be challenging to reach.  If we are unable to get a complete ablation of this lesion on follow-up imaging, perhaps stereotactic body radiation therapy could be considered.  I was able to answer all of Antonio Adams questions.  He is ready to proceed with the percutaneous thermal ablation.  Signed,  Criselda Peaches, MD

## 2020-07-19 ENCOUNTER — Inpatient Hospital Stay (HOSPITAL_COMMUNITY): Admission: RE | Admit: 2020-07-19 | Payer: Medicare Other | Source: Ambulatory Visit

## 2020-07-20 ENCOUNTER — Encounter (HOSPITAL_COMMUNITY): Payer: Self-pay | Admitting: Anesthesiology

## 2020-07-20 ENCOUNTER — Other Ambulatory Visit (HOSPITAL_COMMUNITY)
Admission: RE | Admit: 2020-07-20 | Discharge: 2020-07-20 | Disposition: A | Discharge status: Home / Self Care | Payer: Medicare Other | Source: Ambulatory Visit | Attending: Interventional Radiology | Admitting: Interventional Radiology

## 2020-07-20 ENCOUNTER — Other Ambulatory Visit: Payer: Self-pay | Admitting: Radiology

## 2020-07-20 DIAGNOSIS — Z20822 Contact with and (suspected) exposure to covid-19: Secondary | ICD-10-CM | POA: Diagnosis not present

## 2020-07-20 DIAGNOSIS — Z01812 Encounter for preprocedural laboratory examination: Secondary | ICD-10-CM | POA: Diagnosis not present

## 2020-07-21 ENCOUNTER — Observation Stay (HOSPITAL_COMMUNITY)
Admission: RE | Admit: 2020-07-21 | Discharge: 2020-07-22 | Disposition: A | Discharge status: Home / Self Care | Payer: Medicare Other | Attending: Interventional Radiology | Admitting: Interventional Radiology

## 2020-07-21 ENCOUNTER — Ambulatory Visit (HOSPITAL_COMMUNITY): Payer: Medicare Other

## 2020-07-21 ENCOUNTER — Ambulatory Visit (HOSPITAL_COMMUNITY): Payer: Medicare Other | Admitting: Anesthesiology

## 2020-07-21 ENCOUNTER — Encounter (HOSPITAL_COMMUNITY): Payer: Self-pay | Admitting: Anesthesiology

## 2020-07-21 ENCOUNTER — Ambulatory Visit (HOSPITAL_COMMUNITY)
Admission: RE | Admit: 2020-07-21 | Discharge: 2020-07-21 | Disposition: A | Discharge status: Home / Self Care | Payer: Medicare Other | Source: Ambulatory Visit | Attending: Interventional Radiology | Admitting: Interventional Radiology

## 2020-07-21 ENCOUNTER — Encounter (HOSPITAL_COMMUNITY): Payer: Self-pay

## 2020-07-21 ENCOUNTER — Ambulatory Visit (HOSPITAL_COMMUNITY): Payer: Medicare Other | Admitting: Physician Assistant

## 2020-07-21 ENCOUNTER — Encounter (HOSPITAL_COMMUNITY)
Admission: RE | Disposition: A | Discharge status: Home / Self Care | Payer: Self-pay | Source: Home / Self Care | Attending: Interventional Radiology

## 2020-07-21 DIAGNOSIS — Z7982 Long term (current) use of aspirin: Secondary | ICD-10-CM | POA: Diagnosis not present

## 2020-07-21 DIAGNOSIS — C7971 Secondary malignant neoplasm of right adrenal gland: Secondary | ICD-10-CM | POA: Diagnosis not present

## 2020-07-21 DIAGNOSIS — I129 Hypertensive chronic kidney disease with stage 1 through stage 4 chronic kidney disease, or unspecified chronic kidney disease: Secondary | ICD-10-CM | POA: Diagnosis not present

## 2020-07-21 DIAGNOSIS — I10 Essential (primary) hypertension: Secondary | ICD-10-CM | POA: Diagnosis not present

## 2020-07-21 DIAGNOSIS — Z79899 Other long term (current) drug therapy: Secondary | ICD-10-CM | POA: Diagnosis not present

## 2020-07-21 DIAGNOSIS — J9311 Primary spontaneous pneumothorax: Secondary | ICD-10-CM | POA: Diagnosis not present

## 2020-07-21 DIAGNOSIS — Z794 Long term (current) use of insulin: Secondary | ICD-10-CM | POA: Diagnosis not present

## 2020-07-21 DIAGNOSIS — C649 Malignant neoplasm of unspecified kidney, except renal pelvis: Secondary | ICD-10-CM | POA: Diagnosis present

## 2020-07-21 DIAGNOSIS — Z8546 Personal history of malignant neoplasm of prostate: Secondary | ICD-10-CM | POA: Insufficient documentation

## 2020-07-21 DIAGNOSIS — K668 Other specified disorders of peritoneum: Secondary | ICD-10-CM | POA: Insufficient documentation

## 2020-07-21 DIAGNOSIS — C641 Malignant neoplasm of right kidney, except renal pelvis: Secondary | ICD-10-CM | POA: Diagnosis not present

## 2020-07-21 DIAGNOSIS — K769 Liver disease, unspecified: Secondary | ICD-10-CM

## 2020-07-21 DIAGNOSIS — E119 Type 2 diabetes mellitus without complications: Secondary | ICD-10-CM | POA: Insufficient documentation

## 2020-07-21 DIAGNOSIS — E279 Disorder of adrenal gland, unspecified: Secondary | ICD-10-CM

## 2020-07-21 DIAGNOSIS — C7901 Secondary malignant neoplasm of right kidney and renal pelvis: Principal | ICD-10-CM | POA: Insufficient documentation

## 2020-07-21 DIAGNOSIS — J939 Pneumothorax, unspecified: Secondary | ICD-10-CM | POA: Diagnosis not present

## 2020-07-21 DIAGNOSIS — C787 Secondary malignant neoplasm of liver and intrahepatic bile duct: Secondary | ICD-10-CM | POA: Insufficient documentation

## 2020-07-21 DIAGNOSIS — N183 Chronic kidney disease, stage 3 unspecified: Secondary | ICD-10-CM | POA: Diagnosis not present

## 2020-07-21 DIAGNOSIS — E1142 Type 2 diabetes mellitus with diabetic polyneuropathy: Secondary | ICD-10-CM | POA: Diagnosis not present

## 2020-07-21 DIAGNOSIS — J95811 Postprocedural pneumothorax: Secondary | ICD-10-CM | POA: Diagnosis not present

## 2020-07-21 DIAGNOSIS — Z86010 Personal history of colonic polyps: Secondary | ICD-10-CM | POA: Insufficient documentation

## 2020-07-21 DIAGNOSIS — Z01818 Encounter for other preprocedural examination: Secondary | ICD-10-CM

## 2020-07-21 DIAGNOSIS — E1122 Type 2 diabetes mellitus with diabetic chronic kidney disease: Secondary | ICD-10-CM | POA: Diagnosis not present

## 2020-07-21 HISTORY — PX: RADIOFREQUENCY ABLATION: SHX2290

## 2020-07-21 HISTORY — DX: Post-traumatic stress disorder, unspecified: F43.10

## 2020-07-21 LAB — COMPREHENSIVE METABOLIC PANEL
ALT: 15 U/L (ref 0–44)
AST: 17 U/L (ref 15–41)
Albumin: 3.6 g/dL (ref 3.5–5.0)
Alkaline Phosphatase: 98 U/L (ref 38–126)
Anion gap: 10 (ref 5–15)
BUN: 33 mg/dL — ABNORMAL HIGH (ref 8–23)
CO2: 19 mmol/L — ABNORMAL LOW (ref 22–32)
Calcium: 9.8 mg/dL (ref 8.9–10.3)
Chloride: 111 mmol/L (ref 98–111)
Creatinine, Ser: 3.49 mg/dL — ABNORMAL HIGH (ref 0.61–1.24)
GFR, Estimated: 18 mL/min — ABNORMAL LOW (ref >60)
Glucose, Bld: 131 mg/dL — ABNORMAL HIGH (ref 70–99)
Potassium: 4.5 mmol/L (ref 3.5–5.1)
Sodium: 140 mmol/L (ref 135–145)
Total Bilirubin: 0.5 mg/dL (ref 0.3–1.2)
Total Protein: 7 g/dL (ref 6.5–8.1)

## 2020-07-21 LAB — PREPARE RBC (CROSSMATCH)

## 2020-07-21 LAB — CBC WITH DIFFERENTIAL/PLATELET
Abs Immature Granulocytes: 0.05 10*3/uL (ref 0.00–0.07)
Basophils Absolute: 0.1 10*3/uL (ref 0.0–0.1)
Basophils Relative: 1 %
Eosinophils Absolute: 0.1 10*3/uL (ref 0.0–0.5)
Eosinophils Relative: 3 %
HCT: 26.4 % — ABNORMAL LOW (ref 39.0–52.0)
Hemoglobin: 9 g/dL — ABNORMAL LOW (ref 13.0–17.0)
Immature Granulocytes: 1 %
Lymphocytes Relative: 20 %
Lymphs Abs: 0.8 10*3/uL (ref 0.7–4.0)
MCH: 29.9 pg (ref 26.0–34.0)
MCHC: 34.1 g/dL (ref 30.0–36.0)
MCV: 87.7 fL (ref 80.0–100.0)
Monocytes Absolute: 0.5 10*3/uL (ref 0.1–1.0)
Monocytes Relative: 12 %
Neutro Abs: 2.7 10*3/uL (ref 1.7–7.7)
Neutrophils Relative %: 63 %
Platelets: 215 10*3/uL (ref 150–400)
RBC: 3.01 MIL/uL — ABNORMAL LOW (ref 4.22–5.81)
RDW: 14.5 % (ref 11.5–15.5)
WBC: 4.3 10*3/uL (ref 4.0–10.5)
nRBC: 0 % (ref 0.0–0.2)

## 2020-07-21 LAB — PROTIME-INR
INR: 1 (ref 0.8–1.2)
Prothrombin Time: 12.4 seconds (ref 11.4–15.2)

## 2020-07-21 LAB — GLUCOSE, CAPILLARY
Glucose-Capillary: 122 mg/dL — ABNORMAL HIGH (ref 70–99)
Glucose-Capillary: 155 mg/dL — ABNORMAL HIGH (ref 70–99)

## 2020-07-21 LAB — SARS CORONAVIRUS 2 (TAT 6-24 HRS): SARS Coronavirus 2: NEGATIVE

## 2020-07-21 SURGERY — RADIO FREQUENCY ABLATION
Anesthesia: General

## 2020-07-21 MED ORDER — LACTATED RINGERS IV SOLN: INTRAVENOUS | Status: DC

## 2020-07-21 MED ORDER — AMLODIPINE BESYLATE 5 MG PO TABS
5.0000 mg | ORAL_TABLET | Freq: Every day | ORAL | Status: DC
  Administered 2020-07-21 – 2020-07-22 (×2): 5 mg via ORAL
  Filled 2020-07-21 (×2): qty 1

## 2020-07-21 MED ORDER — MIDAZOLAM HCL 2 MG/2ML IJ SOLN: 0.5000 mg | Freq: Once | INTRAMUSCULAR | Status: DC | PRN

## 2020-07-21 MED ORDER — OXYCODONE HCL 5 MG PO TABS: 5.0000 mg | ORAL_TABLET | Freq: Once | ORAL | Status: DC | PRN

## 2020-07-21 MED ORDER — DARIFENACIN HYDROBROMIDE ER 7.5 MG PO TB24
7.5000 mg | ORAL_TABLET | Freq: Every day | ORAL | Status: DC
  Administered 2020-07-21: 7.5 mg via ORAL
  Filled 2020-07-21 (×2): qty 1

## 2020-07-21 MED ORDER — ROCURONIUM BROMIDE 10 MG/ML (PF) SYRINGE
PREFILLED_SYRINGE | INTRAVENOUS | Status: DC | PRN
  Administered 2020-07-21: 10 mg via INTRAVENOUS
  Administered 2020-07-21: 70 mg via INTRAVENOUS

## 2020-07-21 MED ORDER — ASPIRIN EC 81 MG PO TBEC
81.0000 mg | DELAYED_RELEASE_TABLET | Freq: Every day | ORAL | Status: DC
  Administered 2020-07-21: 81 mg via ORAL
  Filled 2020-07-21 (×2): qty 1

## 2020-07-21 MED ORDER — SODIUM CHLORIDE 0.9 % IV SOLN
2.0000 g | Freq: Once | INTRAVENOUS | Status: AC
  Administered 2020-07-21: 2 g via INTRAVENOUS
  Filled 2020-07-21: qty 2

## 2020-07-21 MED ORDER — PHENYLEPHRINE 40 MCG/ML (10ML) SYRINGE FOR IV PUSH (FOR BLOOD PRESSURE SUPPORT)
PREFILLED_SYRINGE | INTRAVENOUS | Status: DC | PRN
  Administered 2020-07-21 (×2): 80 ug via INTRAVENOUS

## 2020-07-21 MED ORDER — ORAL CARE MOUTH RINSE: 15.0000 mL | Freq: Once | OROMUCOSAL | Status: DC

## 2020-07-21 MED ORDER — CLEVIDIPINE BUTYRATE 0.5 MG/ML IV EMUL
0.0000 mg/h | INTRAVENOUS | Status: DC
  Administered 2020-07-21: 2 mg/h via INTRAVENOUS
  Filled 2020-07-21: qty 50

## 2020-07-21 MED ORDER — CHLORHEXIDINE GLUCONATE 0.12 % MT SOLN
15.0000 mL | Freq: Once | OROMUCOSAL | Status: DC
  Filled 2020-07-21: qty 15

## 2020-07-21 MED ORDER — DEXAMETHASONE SODIUM PHOSPHATE 10 MG/ML IJ SOLN
INTRAMUSCULAR | Status: DC | PRN
  Administered 2020-07-21: 6 mg via INTRAVENOUS

## 2020-07-21 MED ORDER — FENTANYL CITRATE (PF) 250 MCG/5ML IJ SOLN
INTRAMUSCULAR | Status: AC
  Filled 2020-07-21: qty 5

## 2020-07-21 MED ORDER — DILTIAZEM HCL ER COATED BEADS 120 MG PO CP24
120.0000 mg | ORAL_CAPSULE | Freq: Every day | ORAL | Status: DC
  Administered 2020-07-21 – 2020-07-22 (×2): 120 mg via ORAL
  Filled 2020-07-21 (×2): qty 1

## 2020-07-21 MED ORDER — DILTIAZEM HCL ER BEADS 120 MG PO CP24
120.0000 mg | ORAL_CAPSULE | Freq: Every day | ORAL | Status: DC
  Filled 2020-07-21: qty 1

## 2020-07-21 MED ORDER — DOCUSATE SODIUM 100 MG PO CAPS
100.0000 mg | ORAL_CAPSULE | Freq: Two times a day (BID) | ORAL | Status: DC
  Administered 2020-07-21 – 2020-07-22 (×2): 100 mg via ORAL
  Filled 2020-07-21 (×2): qty 1

## 2020-07-21 MED ORDER — HYDROMORPHONE HCL 1 MG/ML IJ SOLN
INTRAMUSCULAR | Status: AC
  Filled 2020-07-21: qty 1

## 2020-07-21 MED ORDER — GEMFIBROZIL 600 MG PO TABS
600.0000 mg | ORAL_TABLET | Freq: Two times a day (BID) | ORAL | Status: DC
  Filled 2020-07-21 (×2): qty 1

## 2020-07-21 MED ORDER — OXYCODONE HCL 5 MG/5ML PO SOLN: 5.0000 mg | Freq: Once | ORAL | Status: DC | PRN

## 2020-07-21 MED ORDER — LIDOCAINE 2% (20 MG/ML) 5 ML SYRINGE
INTRAMUSCULAR | Status: DC | PRN
  Administered 2020-07-21: 60 mg via INTRAVENOUS

## 2020-07-21 MED ORDER — ONDANSETRON HCL 4 MG/2ML IJ SOLN
INTRAMUSCULAR | Status: DC | PRN
Start: 2020-07-21 — End: 2020-07-21
  Administered 2020-07-21: 4 mg via INTRAVENOUS

## 2020-07-21 MED ORDER — SODIUM CHLORIDE 0.9 % IV SOLN: INTRAVENOUS | Status: DC

## 2020-07-21 MED ORDER — FENTANYL CITRATE (PF) 100 MCG/2ML IJ SOLN: 25.0000 ug | INTRAMUSCULAR | Status: DC | PRN

## 2020-07-21 MED ORDER — PHENYLEPHRINE HCL-NACL 10-0.9 MG/250ML-% IV SOLN
INTRAVENOUS | Status: AC
  Filled 2020-07-21: qty 250

## 2020-07-21 MED ORDER — HYDROMORPHONE HCL 1 MG/ML IJ SOLN
1.0000 mg | INTRAMUSCULAR | Status: DC | PRN
  Administered 2020-07-21 – 2020-07-22 (×2): 1 mg via INTRAVENOUS
  Filled 2020-07-21: qty 1

## 2020-07-21 MED ORDER — INDAPAMIDE 1.25 MG PO TABS
1.2500 mg | ORAL_TABLET | Freq: Every day | ORAL | Status: DC
  Administered 2020-07-21 – 2020-07-22 (×2): 1.25 mg via ORAL
  Filled 2020-07-21 (×2): qty 1

## 2020-07-21 MED ORDER — MEPERIDINE HCL 50 MG/ML IJ SOLN: 6.2500 mg | INTRAMUSCULAR | Status: DC | PRN

## 2020-07-21 MED ORDER — SODIUM CHLORIDE 0.9% IV SOLUTION: Freq: Once | INTRAVENOUS | Status: DC

## 2020-07-21 MED ORDER — ATORVASTATIN CALCIUM 40 MG PO TABS
40.0000 mg | ORAL_TABLET | Freq: Every day | ORAL | Status: DC
  Administered 2020-07-21: 40 mg via ORAL
  Filled 2020-07-21 (×2): qty 1

## 2020-07-21 MED ORDER — TRAMADOL HCL 50 MG PO TABS
50.0000 mg | ORAL_TABLET | Freq: Four times a day (QID) | ORAL | Status: DC | PRN
Start: 2020-07-21 — End: 2020-07-22
  Administered 2020-07-21 – 2020-07-22 (×2): 100 mg via ORAL
  Filled 2020-07-21 (×2): qty 2

## 2020-07-21 MED ORDER — SUGAMMADEX SODIUM 200 MG/2ML IV SOLN
INTRAVENOUS | Status: DC | PRN
  Administered 2020-07-21: 200 mg via INTRAVENOUS

## 2020-07-21 MED ORDER — SUGAMMADEX SODIUM 500 MG/5ML IV SOLN
INTRAVENOUS | Status: AC
  Filled 2020-07-21: qty 5

## 2020-07-21 MED ORDER — PROPOFOL 10 MG/ML IV BOLUS
INTRAVENOUS | Status: DC | PRN
  Administered 2020-07-21: 120 mg via INTRAVENOUS

## 2020-07-21 MED ORDER — PROMETHAZINE HCL 25 MG/ML IJ SOLN: 6.2500 mg | INTRAMUSCULAR | Status: DC | PRN

## 2020-07-21 MED ORDER — HYDROXYZINE HCL 25 MG PO TABS: 25.0000 mg | ORAL_TABLET | Freq: Three times a day (TID) | ORAL | Status: DC | PRN

## 2020-07-21 MED ORDER — TAMSULOSIN HCL 0.4 MG PO CAPS
0.4000 mg | ORAL_CAPSULE | Freq: Every day | ORAL | Status: DC
  Administered 2020-07-21: 0.4 mg via ORAL
  Filled 2020-07-21 (×2): qty 1

## 2020-07-21 MED ORDER — FENTANYL CITRATE (PF) 100 MCG/2ML IJ SOLN
INTRAMUSCULAR | Status: DC | PRN
  Administered 2020-07-21: 150 ug via INTRAVENOUS
  Administered 2020-07-21 (×2): 50 ug via INTRAVENOUS

## 2020-07-21 MED ORDER — EPHEDRINE SULFATE-NACL 50-0.9 MG/10ML-% IV SOSY
PREFILLED_SYRINGE | INTRAVENOUS | Status: DC | PRN
  Administered 2020-07-21 (×3): 10 mg via INTRAVENOUS

## 2020-07-21 MED ORDER — INSULIN GLARGINE 100 UNIT/ML SOLOSTAR PEN: 30.0000 [IU] | PEN_INJECTOR | Freq: Every day | SUBCUTANEOUS | Status: DC | PRN

## 2020-07-21 MED ORDER — ONDANSETRON HCL 4 MG/2ML IJ SOLN: 4.0000 mg | Freq: Four times a day (QID) | INTRAMUSCULAR | Status: DC | PRN

## 2020-07-21 MED ORDER — GABAPENTIN 100 MG PO CAPS
100.0000 mg | ORAL_CAPSULE | Freq: Every day | ORAL | Status: DC
  Administered 2020-07-21: 100 mg via ORAL
  Filled 2020-07-21: qty 1

## 2020-07-21 MED ORDER — PHENYLEPHRINE HCL-NACL 10-0.9 MG/250ML-% IV SOLN
INTRAVENOUS | Status: DC | PRN
  Administered 2020-07-21: 20 ug/min via INTRAVENOUS

## 2020-07-21 NOTE — Anesthesia Procedure Notes (Signed)
Procedure Name: Intubation Date/Time: 07/21/2020 1:40 PM Performed by: Lavina Hamman, CRNA Pre-anesthesia Checklist: Patient identified, Emergency Drugs available, Suction available, Patient being monitored and Timeout performed Patient Re-evaluated:Patient Re-evaluated prior to induction Oxygen Delivery Method: Simple face mask Preoxygenation: Pre-oxygenation with 100% oxygen Induction Type: IV induction and Cricoid Pressure applied Ventilation: Mask ventilation without difficulty and Oral airway inserted - appropriate to patient size Laryngoscope Size: Glidescope and 4 Grade View: Grade I Tube type: Oral Tube size: 7.5 mm Number of attempts: 1 Airway Equipment and Method: Stylet and Video-laryngoscopy Placement Confirmation: ETT inserted through vocal cords under direct vision,  breath sounds checked- equal and bilateral and positive ETCO2 Secured at: 23 cm Tube secured with: Tape Dental Injury: Teeth and Oropharynx as per pre-operative assessment  Comments: ATOI Elective glidescope due to prior intubations.

## 2020-07-21 NOTE — Anesthesia Postprocedure Evaluation (Signed)
Anesthesia Post Note  Patient: Antonio Adams  Procedure(s) Performed: CT MICROWAVE ABLATION (N/A )     Patient location during evaluation: PACU Anesthesia Type: General Level of consciousness: awake and alert Pain management: pain level controlled Vital Signs Assessment: post-procedure vital signs reviewed and stable Respiratory status: spontaneous breathing, nonlabored ventilation, respiratory function stable and patient connected to nasal cannula oxygen Cardiovascular status: blood pressure returned to baseline and stable Postop Assessment: no apparent nausea or vomiting Anesthetic complications: no   No complications documented.  Last Vitals:  Vitals:   07/21/20 1730 07/21/20 1800  BP: (!) 164/89 (!) 171/86  Pulse: 68 68  Resp: 17 19  Temp: (!) 36.4 C   SpO2: 100% 98%    Last Pain:  Vitals:   07/21/20 1815  TempSrc:   PainSc: 2                  Demerius Podolak L Matalie Romberger

## 2020-07-21 NOTE — Anesthesia Procedure Notes (Signed)
Arterial Line Insertion Start/End3/01/2021 12:01 PM, 07/21/2020 12:11 PM Performed by: Annye Asa, MD, Key, Kristopher, CRNA, anesthesiologist  Patient location: Pre-op. Preanesthetic checklist: patient identified, IV checked, risks and benefits discussed, surgical consent, monitors and equipment checked, pre-op evaluation, timeout performed and anesthesia consent Lidocaine 1% used for infiltration Right, radial was placed Catheter size: 20 G Hand hygiene performed   Attempts: 3 (CRNA unsuccessful) Procedure performed using ultrasound guided technique. Ultrasound Notes:anatomy identified, needle tip was noted to be adjacent to the nerve/plexus identified, no ultrasound evidence of intravascular and/or intraneural injection and image(s) printed for medical record Following insertion, dressing applied and Biopatch. Post procedure assessment: normal  Patient tolerated the procedure well with no immediate complications. Additional procedure comments: Arterial line: Timeout, sterile prep, drape, R wrist.  1% lido local, US guided #[id number] AC into Radial artery. Biopatch and sterile dressing on.  Patient tolerated well.  VSS.  Jenita Seashore, MD.

## 2020-07-21 NOTE — H&P (Signed)
Referring Physician(s): Herrick,B  Supervising Physician: Jacqulynn Cadet  Patient Status:  [facility] OP TBA  Chief Complaint: Metastatic papillary renal cell carcinoma   Subjective: Antonio Adams is a 75 y.o. male with a history of  6 cm right papillary renal cell carcinoma resected in April of 2017 (negative margins) but subsequently developed hepatic and adrenal metastatic disease. Biopsy proven metastatic RCC 8/21.   He underwent open partial hepatectomy, renal metastasectomy and adrenalectomy in October of 2021.  MRI of the abdomen performed on 07/02/2020 revealed: 1. Mildly motion degraded exam. 2. Surgical changes about the right hepatic lobe, right kidney, and right adrenal fossa. Residual/recurrent disease within the right adrenal fossa and adjacent right hepatic lobe, progressive compared to the prior MRI of 11/27/2019. Subtle soft tissue signal within the apex of the right adrenal fossa is indeterminate but suspicious for stable small volume disease. 3. No distant metastasis identified. 4. New small right pleural effusion. 5. Pancreas divisum. 6.  Aortic Atherosclerosis  Following recent discussions with Dr. Laurence Ferrari regarding treatment options he presents today for image guided percutaneous microwave ablation of metastatic disease in region of right adrenal gland/adrenal fossa as well as liver.  He currently denies fever, headache, chest pain, dyspnea, cough, nausea, vomiting.  He does have some right lateral abdominal pain with some radiation to mid epigastric region and occasional blood in stool.  Additional medical history as listed below.  Past Medical History:  Diagnosis Date  . ADENOCARCINOMA, PROSTATE, GLEASON GRADE 6 12/21/2009   prostate cancer  . ALLERGIC RHINITIS 03/16/2007  . Allergy   . Anxiety   . Arthritis    back   . BACK PAIN WITH RADICULOPATHY 11/23/2008  . Cancer of kidney ([place])    partial right kidney removed  . CARPAL TUNNEL SYNDROME, BILATERAL  03/16/2007  . Chronic back pain   . Constipation    takes Senokot daily  . Depression    occasionally  . DIABETES MELLITUS, TYPE II 03/16/2007   takes Januvia and MEtformin daily  . GASTROINTESTINAL HEMORRHAGE, HX OF 03/16/2007  . GERD 03/16/2007   takes Omeprazole daily  . Glaucoma    mild - no eye drops  . Hemorrhoids   . History of colon polyps   . HYPERLIPIDEMIA 03/16/2007   takes Crestor daily  . HYPERTENSION 03/16/2007   takes Diltiazem and Lisinopril daily   . Kidney cancer, primary, with metastasis from kidney to other site [facility])   . LEG CRAMPS 05/22/2007  . Nocturia   . Overactive bladder   . PARESTHESIA 05/21/2007  . Proctitis   . Rectal bleeding    Dr.Norins has explained its from the Radiation that he has received  . Rectal bleeding   . Sleep apnea    uses CPAP nightly  . Stomach cancer ([place])   . Urinary frequency    takes Toviaz daily   Past Surgical History:  Procedure Laterality Date  . ADRENALECTOMY Right 02/26/2020   Procedure: OPEN RIGHT RENAL CELL METASTATECTOMY AND  ADRENALECTOMY;  Surgeon: [name], MD;  Location: [facility];  Service: Urology;  Laterality: Right;  . [place], 2008   repeat surgery; ESI '08  . CARPAL TUNNEL RELEASE     bilateral  . CERVICAL FUSION    . COLONOSCOPY    . colonosocpy    . ESOPHAGOGASTRODUODENOSCOPY     ED  . FLEXIBLE SIGMOIDOSCOPY N/A 01/08/2017   Procedure: FLEXIBLE SIGMOIDOSCOPY;  Surgeon: Irene Shipper, MD;  Location: [facility] ENDOSCOPY;  Service: Endoscopy;  Laterality: N/A;  . HOT HEMOSTASIS N/A 01/08/2017   Procedure: HOT HEMOSTASIS (ARGON PLASMA COAGULATION/BICAP);  Surgeon: Irene Shipper, MD;  Location: [facility] ENDOSCOPY;  Service: Endoscopy;  Laterality: N/A;  . IR RADIOLOGIST EVAL & MGMT  12/17/2019  . IR RADIOLOGIST EVAL & MGMT  07/01/2020  . LUMBAR LAMINECTOMY  10/30/2011   Procedure: MICRODISCECTOMY LUMBAR LAMINECTOMY;  Surgeon: Jessy Oto, MD;  Location: [place];  Service: Orthopedics;  Laterality: Right;   Right L4-5 and L5-S1 Microdiscectomy  . LUMBAR LAMINECTOMY  06/17/2012   Procedure: MICRODISCECTOMY LUMBAR LAMINECTOMY;  Surgeon: Jessy Oto, MD;  Location: [place];  Service: Orthopedics;  Laterality: N/A;  Right L5-S1 microdiscectomy  . OPEN PARTIAL HEPATECTOMY  N/A 02/26/2020   Procedure: OPEN PARTIAL HEPATECTOMY, CHOLECYSTECTOMY;  Surgeon: Stark Klein, MD;  Location: [facility];  Service: General;  Laterality: N/A;  . POLYPECTOMY    . PROSTATE SURGERY  march 2012   seed implant  . ROBOTIC ASSITED PARTIAL NEPHRECTOMY Right 09/08/2015   Procedure: XI ROBOTIC ASSITED RIGHT PARTIAL NEPHRECTOMY;  Surgeon: [name], MD;  Location: [facility];  Service: Urology;  Laterality: Right;  Clamp on: 1033 Clamp off: 1103 Total Clamp Time: 30 minutes  . SHOULDER ARTHROSCOPY WITH SUBACROMIAL DECOMPRESSION Left 01/20/2016   Procedure: LEFT SHOULDER ARTHROSCOPY WITH EXTENSIVE  DEBRIDEMENT, ACROMIOPLASTY;  Surgeon: Ninetta Lights, MD;  Location: [place];  Service: Orthopedics;  Laterality: Left;  . SHOULDER SURGERY  LFT  . Stress Cardiolite  12/24/2003   negative for ischemia  . UPPER ESOPHAGEAL ENDOSCOPIC ULTRASOUND (EUS)  04/18/2016   [facility]  . UPPER GASTROINTESTINAL ENDOSCOPY    . 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 Authorizing Provider  amLODipine (NORVASC) 5 MG tablet Take 1 tablet (5 mg total) by mouth daily. 05/23/20  Yes [name], MD  aspirin EC 81 MG tablet Take 81 mg by mouth daily. Swallow whole.   Yes [provider]  atorvastatin (LIPITOR) 40 MG tablet Take 1 tablet (40 mg total) by mouth daily. 12/20/19  Yes [name], MD  cholecalciferol (VITAMIN D) 1000 units tablet Take 1,000 Units by mouth daily.   Yes [provider]  diltiazem (TIAZAC) 120 MG 24 hr  capsule Take 120 mg by mouth daily. 02/06/20  Yes [provider]  docusate sodium (COLACE) 100 MG capsule Take 1 capsule (100 mg total) by mouth 2 (two) times daily. 03/03/20  Yes [name], MD  ferrous sulfate 325 (65 FE) MG tablet Take 1 tablet (325 mg total) by mouth 2 (two) times daily. Patient needs office visit for further refills Patient taking differently: Take 325 mg by mouth daily with breakfast. 02/24/19  Yes Irene Shipper, MD  gabapentin (NEURONTIN) 100 MG capsule Take 100 mg by mouth at bedtime.   Yes [provider]  gemfibrozil (LOPID) 600 MG tablet Take 600 mg by mouth 2 (two) times daily before a meal.   Yes [provider]  hydrOXYzine (ATARAX/VISTARIL) 25 MG tablet Take 25 mg by mouth 3 (three) times daily as needed for anxiety, itching or nausea.    Yes [provider]  indapamide (LOZOL) 1.25 MG tablet Take 1.25 mg by mouth daily.   Yes [provider]  insulin glargine (LANTUS  SOLOSTAR) 100 UNIT/ML Solostar Pen Inject 30 Units into the skin daily as needed (blood sugar over 200). 05/23/20  Yes [name], MD  Omega-3 Fatty Acids (FISH OIL PO) Take 1 tablet by mouth daily.   Yes [provider]  OVER THE COUNTER MEDICATION Take 1 tablet by mouth 2 (two) times daily. Focus factor   Yes [provider]  Oxymetazoline HCl (VICKS SINEX NA) Place 1 spray into the nose as needed (congestion).   Yes [provider]  polyvinyl alcohol (LIQUIFILM TEARS) 1.4 % ophthalmic solution Place 1 drop into both eyes as needed for dry eyes.   Yes [provider]  solifenacin (VESICARE) 5 MG tablet Take 1 tablet (5 mg total) by mouth daily. 05/20/20  Yes [name], MD  tamsulosin (FLOMAX) 0.4 MG CAPS capsule TAKE 1 CAPSULE (0.4 MG TOTAL) BY MOUTH DAILY. Patient taking differently: Take 0.4 mg by mouth daily. 01/20/14  Yes [name], MD  traMADol (ULTRAM) 50 MG tablet Take 50-100 mg by mouth every 6  (six) hours as needed for pain. 06/03/20  Yes [provider]  acetaminophen (TYLENOL) 500 MG tablet Take 2 tablets (1,000 mg total) by mouth every 6 (six) hours. Patient not taking: Reported on 07/12/2020 03/03/20   [name], MD  clotrimazole-betamethasone (LOTRISONE) cream APPLY TO AFFECTED AREA TWICE A DAY Patient not taking: Reported on 07/12/2020 04/14/20   [name], MD  Continuous Blood Gluc Receiver (FREESTYLE LIBRE 14 DAY READER) DEVI 1 Act by Does not apply route daily. 05/23/20   [name], MD  Continuous Blood Gluc Sensor (FREESTYLE LIBRE 2 SENSOR) MISC 1 Act by Does not apply route daily. 05/23/20   [name], MD  Insulin Pen Needle 32G X 6 MM MISC 1 Act by Does not apply route daily. 05/23/20   [name], MD  ONE TOUCH ULTRA TEST test strip USE 2 (TWO) TIMES DAILY. AND LANCETS 2/DAY 250.03 11/05/16   [name], MD     Vital Signs:  Vitals:   07/21/20 1055  BP: (!) 151/87  Pulse: 72  Resp: 16  Temp: 98.6 F (37 C)  SpO2: 97%       Physical Exam awake, alert.  Chest with slightly diminished breath sounds right base, left clear.  Heart with regular rate and rhythm.  Abdomen soft, positive bowel sounds, some tenderness to palpation right lateral abdominal/epigastric regions to palpation; trace pretibial edema bilaterally.  Imaging: No results found.  Labs:  CBC: Recent Labs    03/02/20 0444 03/03/20 0452 05/20/20 1605 07/12/20 1444  WBC 5.7 6.0 3.2* 3.3*  HGB 9.1* 8.4* 9.7* 9.3*  HCT 26.4* 25.6* 27.9* 27.7*  PLT 127* 127* 221.0 215    COAGS: Recent Labs    01/05/20 0916 02/16/20 1257 02/26/20 1526  INR 1.0 0.9 1.3*    BMP: Recent Labs    11/21/19 1849 12/18/19 1425 02/16/20 1257 02/26/20 1224 03/01/20 0316 03/02/20 0444 03/03/20 0452 05/20/20 1605 07/12/20 1444  NA 134* 139 136   < > 134* 136 136 138 140  K 4.7 5.1 4.8   < > 3.8 3.9 4.0 4.5 4.4  CL 105 107 107   < > 104 105 104 106 109  CO2 21* 27  22   < > 20* 19* 20* 26 22  GLUCOSE 107* 93 95   < > 136* 128* 129* 194* 189*  BUN 31* 24 28*   < > 37* 39*  42* 30* 34*  CALCIUM 9.6 10.0 9.7   < > 8.5* 9.4 9.6 9.9 9.6  CREATININE 2.17* 2.11* 2.22*   < > 3.09* 2.74* 2.96* 2.97* 3.54*  GFRNONAA 29* 30* 28*   < > 19* 22* 20*  --  17*  GFRAA 34* 35* 33*  --   --   --   --   --   --    < > = values in this interval not displayed.    LIVER FUNCTION TESTS: Recent Labs    02/16/20 1257 05/20/20 1605  BILITOT 0.4 0.3  AST 21 13  ALT 20 12  ALKPHOS 30 121*  PROT 7.1 6.7  ALBUMIN 3.8 4.1    Assessment and Plan: 75 y.o. male with PMH sig for sleep apnea, hypertension, hyperlipidemia, colon polyps, glaucoma, GERD, diabetes, anxiety/depression, prostate cancer 2011 as well as history of  6 cm right papillary renal cell carcinoma resected in April of 2017 (negative margins) but subsequently developed hepatic and adrenal metastatic disease. Biopsy proven metastatic RCC 8/21.   He underwent open partial hepatectomy, renal metastasectomy and adrenalectomy in October of 2021.  MRI of the abdomen performed on 07/02/2020 revealed: 1. Mildly motion degraded exam. 2. Surgical changes about the right hepatic lobe, right kidney, and right adrenal fossa. Residual/recurrent disease within the right adrenal fossa and adjacent right hepatic lobe, progressive compared to the prior MRI of 11/27/2019. Subtle soft tissue signal within the apex of the right adrenal fossa is indeterminate but suspicious for stable small volume disease. 3. No distant metastasis identified. 4. New small right pleural effusion. 5. Pancreas divisum. 6.  Aortic Atherosclerosis  Following recent discussions with Dr. Laurence Ferrari regarding treatment options he presents today for image guided percutaneous microwave ablation of metastatic disease in region of right adrenal gland/adrenal fossa as well as liver.  Details/risks of procedure, including but not limited to, internal bleeding,  infection, injury to adjacent structures, anesthesia related complications, possible hypertensive emergency discussed with patient his understanding and consent.  Post procedure he will be admitted to [facility] overnight for observation.   Electronically Signed: D. Rowe Robert, PA-C 07/21/2020, 10:32 AM   I spent a total of 30 minutes at the the patient's bedside AND on the patient's hospital floor or unit, greater than 50% of which was counseling/coordinating care for image guided percutaneous microwave ablation of metastatic renal cell carcinoma to region of right adrenal gland/adrenal fossa and liver

## 2020-07-21 NOTE — Transfer of Care (Signed)
Immediate Anesthesia Transfer of Care Note  Patient: Antonio Adams  Procedure(s) Performed: Procedure(s): CT MICROWAVE ABLATION (N/A)  Patient Location: PACU  Anesthesia Type:General  Level of Consciousness:  sedated, patient cooperative and responds to stimulation  Airway & Oxygen Therapy:Patient Spontanous Breathing and Patient connected to face mask oxgen  Post-op Assessment:  Report given to PACU RN and Post -op Vital signs reviewed and stable  Post vital signs:  Reviewed and stable  Last Vitals:  Vitals:   07/21/20 1055 07/21/20 1631  BP: (!) 151/87 (!) 150/78  Pulse: 72 75  Resp: 16 20  Temp: 37 C (!) 36.4 C  SpO2: 81%     Complications: No apparent anesthesia complications

## 2020-07-21 NOTE — Anesthesia Preprocedure Evaluation (Addendum)
Anesthesia Evaluation  Patient identified by MRN, date of birth, ID band Patient awake    Reviewed: Allergy & Precautions, NPO status , Patient's Chart, lab work & pertinent test results  History of Anesthesia Complications Negative for: history of anesthetic complications  Airway Mallampati: I  TM Distance: >3 FB Neck ROM: Full    Dental  (+) Teeth Intact, Dental Advisory Given   Pulmonary sleep apnea and Continuous Positive Airway Pressure Ventilation ,  07/20/2020 SARS coronavirus NEG   breath sounds clear to auscultation       Cardiovascular hypertension (did not take BP meds this am), Pt. on medications (-) angina Rhythm:Regular Rate:Normal     Neuro/Psych PSYCHIATRIC DISORDERS (PTSD) Anxiety Depression negative neurological ROS     GI/Hepatic Neg liver ROS, GERD  Controlled,  Endo/Other  diabetes, Insulin Dependent, Oral Hypoglycemic Agents  Renal/GU Renal InsufficiencyRenal diseaseMetastatic Renal cell carcinoma: now s/p excision, adrenalectomy, partial hepatectomy   H/o adenocarcinoma of prostate    Musculoskeletal  (+) Arthritis ,   Abdominal   Peds  Hematology  (+) Blood dyscrasia (Hb 9.3), anemia ,   Anesthesia Other Findings   Reproductive/Obstetrics                            Anesthesia Physical Anesthesia Plan  ASA: III  Anesthesia Plan: General   Post-op Pain Management:    Induction: Intravenous  PONV Risk Score and Plan: 2 and Ondansetron and Dexamethasone  Airway Management Planned: Oral ETT  Additional Equipment: Arterial line  Intra-op Plan:   Post-operative Plan: Extubation in OR  Informed Consent: I have reviewed the patients History and Physical, chart, labs and discussed the procedure including the risks, benefits and alternatives for the proposed anesthesia with the patient or authorized representative who has indicated his/her understanding and  acceptance.     Dental advisory given  Plan Discussed with: CRNA and Surgeon  Anesthesia Plan Comments:         Anesthesia Quick Evaluation

## 2020-07-21 NOTE — Procedures (Signed)
Interventional Radiology Procedure Note  Procedure:   1.) Percutaneous microwave ablation (MWA) of right adrenal metastasis 2.) MWA of hepatic metastasis 3.) Placement of chest tube  Complications: Pneumothorax  Estimated Blood Loss: None  Recommendations: - Chest tube to LWS at -20 cm H2O - ADAT - Bedrest tonight - CXR now and again in am - am labs   Signed,  Criselda Peaches, MD

## 2020-07-22 ENCOUNTER — Observation Stay (HOSPITAL_COMMUNITY): Payer: Medicare Other

## 2020-07-22 ENCOUNTER — Encounter (HOSPITAL_COMMUNITY): Payer: Self-pay | Admitting: Interventional Radiology

## 2020-07-22 DIAGNOSIS — J90 Pleural effusion, not elsewhere classified: Secondary | ICD-10-CM | POA: Diagnosis not present

## 2020-07-22 DIAGNOSIS — Z794 Long term (current) use of insulin: Secondary | ICD-10-CM | POA: Diagnosis not present

## 2020-07-22 DIAGNOSIS — J9811 Atelectasis: Secondary | ICD-10-CM | POA: Diagnosis not present

## 2020-07-22 DIAGNOSIS — I517 Cardiomegaly: Secondary | ICD-10-CM | POA: Diagnosis not present

## 2020-07-22 DIAGNOSIS — C7971 Secondary malignant neoplasm of right adrenal gland: Secondary | ICD-10-CM | POA: Diagnosis not present

## 2020-07-22 DIAGNOSIS — C787 Secondary malignant neoplasm of liver and intrahepatic bile duct: Secondary | ICD-10-CM | POA: Diagnosis not present

## 2020-07-22 DIAGNOSIS — Z7982 Long term (current) use of aspirin: Secondary | ICD-10-CM | POA: Diagnosis not present

## 2020-07-22 DIAGNOSIS — C641 Malignant neoplasm of right kidney, except renal pelvis: Secondary | ICD-10-CM | POA: Diagnosis not present

## 2020-07-22 DIAGNOSIS — I10 Essential (primary) hypertension: Secondary | ICD-10-CM | POA: Diagnosis not present

## 2020-07-22 DIAGNOSIS — C7901 Secondary malignant neoplasm of right kidney and renal pelvis: Secondary | ICD-10-CM | POA: Diagnosis not present

## 2020-07-22 DIAGNOSIS — K668 Other specified disorders of peritoneum: Secondary | ICD-10-CM | POA: Diagnosis not present

## 2020-07-22 DIAGNOSIS — E119 Type 2 diabetes mellitus without complications: Secondary | ICD-10-CM | POA: Diagnosis not present

## 2020-07-22 DIAGNOSIS — Z86010 Personal history of colonic polyps: Secondary | ICD-10-CM | POA: Diagnosis not present

## 2020-07-22 DIAGNOSIS — Z79899 Other long term (current) drug therapy: Secondary | ICD-10-CM | POA: Diagnosis not present

## 2020-07-22 LAB — CBC
HCT: 27.8 % — ABNORMAL LOW (ref 39.0–52.0)
Hemoglobin: 9.1 g/dL — ABNORMAL LOW (ref 13.0–17.0)
MCH: 30 pg (ref 26.0–34.0)
MCHC: 32.7 g/dL (ref 30.0–36.0)
MCV: 91.7 fL (ref 80.0–100.0)
Platelets: 151 10*3/uL (ref 150–400)
RBC: 3.03 MIL/uL — ABNORMAL LOW (ref 4.22–5.81)
RDW: 14.7 % (ref 11.5–15.5)
WBC: 6 10*3/uL (ref 4.0–10.5)
nRBC: 0 % (ref 0.0–0.2)

## 2020-07-22 LAB — COMPREHENSIVE METABOLIC PANEL
ALT: 166 U/L — ABNORMAL HIGH (ref 0–44)
AST: 288 U/L — ABNORMAL HIGH (ref 15–41)
Albumin: 3.1 g/dL — ABNORMAL LOW (ref 3.5–5.0)
Alkaline Phosphatase: 83 U/L (ref 38–126)
Anion gap: 7 (ref 5–15)
BUN: 31 mg/dL — ABNORMAL HIGH (ref 8–23)
CO2: 20 mmol/L — ABNORMAL LOW (ref 22–32)
Calcium: 8.9 mg/dL (ref 8.9–10.3)
Chloride: 108 mmol/L (ref 98–111)
Creatinine, Ser: 3.26 mg/dL — ABNORMAL HIGH (ref 0.61–1.24)
GFR, Estimated: 19 mL/min — ABNORMAL LOW (ref >60)
Glucose, Bld: 160 mg/dL — ABNORMAL HIGH (ref 70–99)
Potassium: 5.4 mmol/L — ABNORMAL HIGH (ref 3.5–5.1)
Sodium: 135 mmol/L (ref 135–145)
Total Bilirubin: 0.7 mg/dL (ref 0.3–1.2)
Total Protein: 6.5 g/dL (ref 6.5–8.1)

## 2020-07-22 NOTE — Progress Notes (Addendum)
Patient ID: Antonio Adams, male   DOB: [birth year], 75 y.o.   MRN: [id number] Pt s/p 1.) Percutaneous microwave ablation (MWA) of right adrenal metastasis 2.) MWA of hepatic metastasis 3.) Placement of right chest tube for ptx 07/21/20;  resting quietly; has voided without difficulty;currently without fever,HA,CP,dyspnea, cough, abd/back pain,N/V or bleeding; he does note some new paresthesias of left hand/fingers which may be secondary to arm positioning during CT yesterday; strength ok at this time;  VSS;afebrile; rt chest tube in place, to wall suction, no air leak; CXR today with resolved rt ptx; tiny rt effusion; f/u labs- WBC nl; hgb 9.1(9.0), K 5.4, creat 3.26(3.49)  Pt seen with Drs. McCullough/Herrick this am; will d/c wall suction to chest tube/clamp tube; obtain f/u CXR in 2 hrs; discharge plans pending result of f/u CXR.

## 2020-07-22 NOTE — Discharge Summary (Signed)
Patient ID: AVEL OGAWA MRN: [id number] DOB/AGE: [birth year] 75 y.o.  Admit date: 07/21/2020 Discharge date: 07/22/2020  Supervising Physician: Jacqulynn Cadet  Patient Status: [facility] - In-pt  Admission Diagnoses: Metastatic renal cell carcinoma to right adrenal gland, right pararenal fossa and liver  Discharge Diagnoses: Metastatic renal cell carcinoma to right adrenal gland, right pararenal fossa and liver, status post CT-guided microwave ablation of intrahepatic metastasis, CT-guided microwave ablation of right adrenal metastasis and placement of right chest tube for pneumothorax 07/21/20 with removal on 07/22/2020 Active Problems:   Metastatic renal cell carcinoma [facility])  Past Medical History:  Diagnosis Date  . ADENOCARCINOMA, PROSTATE, GLEASON GRADE 6 12/21/2009   prostate cancer  . ALLERGIC RHINITIS 03/16/2007  . Allergy   . Anxiety   . Arthritis    back   . BACK PAIN WITH RADICULOPATHY 11/23/2008  . Cancer of kidney ([place])    partial right kidney removed  . CARPAL TUNNEL SYNDROME, BILATERAL 03/16/2007  . Chronic back pain   . Constipation    takes Senokot daily  . Depression    occasionally  . DIABETES MELLITUS, TYPE II 03/16/2007   takes Januvia and MEtformin daily  . GASTROINTESTINAL HEMORRHAGE, HX OF 03/16/2007  . GERD 03/16/2007   takes Omeprazole daily  . Glaucoma    mild - no eye drops  . Hemorrhoids   . History of colon polyps   . HYPERLIPIDEMIA 03/16/2007   takes Crestor daily  . HYPERTENSION 03/16/2007   takes Diltiazem and Lisinopril daily   . Kidney cancer, primary, with metastasis from kidney to other site [facility])   . LEG CRAMPS 05/22/2007  . Nocturia   . Overactive bladder   . PARESTHESIA 05/21/2007  . Proctitis   . PTSD (post-traumatic stress disorder)    wakes up may be dreaming of fighting  . Rectal bleeding    Dr.Norins has explained its from the Radiation that he has received  . Rectal bleeding   . Sleep apnea    uses CPAP nightly  . Stomach  cancer ([place])   . Urinary frequency    takes Toviaz daily   Past Surgical History:  Procedure Laterality Date  . ADRENALECTOMY Right 02/26/2020   Procedure: OPEN RIGHT RENAL CELL METASTATECTOMY AND  ADRENALECTOMY;  Surgeon: [name], MD;  Location: [facility];  Service: Urology;  Laterality: Right;  . [place], 2008   repeat surgery; ESI '08  . CARPAL TUNNEL RELEASE     bilateral  . CERVICAL FUSION    . COLONOSCOPY    . colonosocpy    . ESOPHAGOGASTRODUODENOSCOPY     ED  . FLEXIBLE SIGMOIDOSCOPY N/A 01/08/2017   Procedure: FLEXIBLE SIGMOIDOSCOPY;  Surgeon: Irene Shipper, MD;  Location: [facility] ENDOSCOPY;  Service: Endoscopy;  Laterality: N/A;  . HOT HEMOSTASIS N/A 01/08/2017   Procedure: HOT HEMOSTASIS (ARGON PLASMA COAGULATION/BICAP);  Surgeon: Irene Shipper, MD;  Location: [facility] ENDOSCOPY;  Service: Endoscopy;  Laterality: N/A;  . IR RADIOLOGIST EVAL & MGMT  12/17/2019  . IR RADIOLOGIST EVAL & MGMT  07/01/2020  . LUMBAR LAMINECTOMY  10/30/2011   Procedure: MICRODISCECTOMY LUMBAR LAMINECTOMY;  Surgeon: Jessy Oto, MD;  Location: [place];  Service: Orthopedics;  Laterality: Right;  Right L4-5 and L5-S1 Microdiscectomy  . LUMBAR LAMINECTOMY  06/17/2012   Procedure: MICRODISCECTOMY LUMBAR LAMINECTOMY;  Surgeon: Jessy Oto, MD;  Location: [place];  Service: Orthopedics;  Laterality: N/A;  Right L5-S1 microdiscectomy  . OPEN PARTIAL HEPATECTOMY  N/A 02/26/2020  Procedure: OPEN PARTIAL HEPATECTOMY, CHOLECYSTECTOMY;  Surgeon: Stark Klein, MD;  Location: [facility];  Service: General;  Laterality: N/A;  . POLYPECTOMY    . PROSTATE SURGERY  march 2012   seed implant  . RADIOFREQUENCY ABLATION N/A 07/21/2020   Procedure: CT MICROWAVE ABLATION;  Surgeon: Criselda Peaches, MD;  Location: [facility];  Service: Anesthesiology;  Laterality: N/A;  . ROBOTIC ASSITED PARTIAL NEPHRECTOMY Right 09/08/2015   Procedure: XI ROBOTIC ASSITED RIGHT PARTIAL NEPHRECTOMY;  Surgeon: [name], MD;   Location: [facility];  Service: Urology;  Laterality: Right;  Clamp on: 1033 Clamp off: 1103 Total Clamp Time: 30 minutes  . SHOULDER ARTHROSCOPY WITH SUBACROMIAL DECOMPRESSION Left 01/20/2016   Procedure: LEFT SHOULDER ARTHROSCOPY WITH EXTENSIVE  DEBRIDEMENT, ACROMIOPLASTY;  Surgeon: Ninetta Lights, MD;  Location: [place];  Service: Orthopedics;  Laterality: Left;  . SHOULDER SURGERY  LFT  . Stress Cardiolite  12/24/2003   negative for ischemia  . UPPER ESOPHAGEAL ENDOSCOPIC ULTRASOUND (EUS)  04/18/2016   [facility] hospital  . UPPER GASTROINTESTINAL ENDOSCOPY    . WOUND EXPLORATION     management of a wound that he sustained in the [place]     Discharged Condition: good  Hospital Course: Mr. 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 Biopsy proven metastatic RCC 8/21. He underwent open partial hepatectomy, renal metastasectomy and adrenalectomy in October of 2021.  MRI of the abdomen performed on 07/02/2020 revealed: 1. Mildly motion degraded exam. 2. Surgical changes about the right hepatic lobe, right kidney, and right adrenal fossa. Residual/recurrent disease within the right adrenal fossa and adjacent right hepatic lobe, progressive compared to the prior MRI of 11/27/2019. Subtle soft tissue signal within the apex of the right adrenal fossa is indeterminate but suspicious for stable small volume disease. 3. No distant metastasis identified. 4. New small right pleural effusion. 5. Pancreas divisum. 6. Aortic Atherosclerosis  Following recent discussions with Dr. Laurence Ferrari regarding treatment options he presented to [facility] on 07/21/20 for CT guided percutaneous microwave ablation of metastatic disease in region of right adrenal gland/adrenal fossa as well as liver.  On 07/21/2020 he underwent CT-guided microwave ablation  of intrahepatic metastasis, right adrenal metastasis and placement of right chest tube secondary to post procedure pneumothorax. A small 1.3 x 1.3 cm nodule along the posterior and medial aspect of the capsule of hepatic segment 6 as well as an adjacent 2.0 x 1.3 cm implants along the abdominal surface of the diaphragm were visualized but not treated.  The procedure was performed via general anesthesia.  Patient was subsequently admitted for overnight observation.  Overnight the patient did well with the exception of some mild right flank/ lateral abdominal discomfort.  On the day of discharge he was stable.  He was able to void (no hematuria), tolerate his diet and ambulate without difficulty.  Subsequent chest x-rays revealed resolution of right pneumothorax with minimal right effusion.  Patient's chest tube was clamped, wall suction was discontinued and subsequent film revealed stability with no pneumothorax.  Right chest tube was subsequently removed without difficulty and Vaseline gauze dressing was applied over site.  Site care instructions were reviewed with patient.  He continued to have some mild paresthesias of the fingers of left hand.  F/u labs- WBC nl; hgb 9.1(9.0), K 5.4, creat 3.26(3.49).  Following discussions with Dr. Laurence Ferrari patient was deemed  stable for discharge home at this time. He will continue his current home medications. He will follow up with Dr. Laurence Ferrari in the [place] clinic in 2 weeks.  He was told to contact our service with any interval questions or concerns.  Consults: none  Significant Diagnostic Studies: Results for orders placed or performed during the hospital encounter of 07/21/20  Glucose, capillary  Result Value Ref Range   Glucose-Capillary 122 (H) 70 - 99 mg/dL  Glucose, capillary  Result Value Ref Range   Glucose-Capillary 155 (H) 70 - 99 mg/dL  CBC  Result Value Ref Range   WBC 6.0 4.0 - 10.5 K/uL   RBC 3.03 (L) 4.22 - 5.81 MIL/uL   Hemoglobin 9.1 (L) 13.0  - 17.0 g/dL   HCT 27.8 (L) 39.0 - 52.0 %   MCV 91.7 80.0 - 100.0 fL   MCH 30.0 26.0 - 34.0 pg   MCHC 32.7 30.0 - 36.0 g/dL   RDW 14.7 11.5 - 15.5 %   Platelets 151 150 - 400 K/uL   nRBC 0.0 0.0 - 0.2 %  Comprehensive metabolic panel  Result Value Ref Range   Sodium 135 135 - 145 mmol/L   Potassium 5.4 (H) 3.5 - 5.1 mmol/L   Chloride 108 98 - 111 mmol/L   CO2 20 (L) 22 - 32 mmol/L   Glucose, Bld 160 (H) 70 - 99 mg/dL   BUN 31 (H) 8 - 23 mg/dL   Creatinine, Ser 3.26 (H) 0.61 - 1.24 mg/dL   Calcium 8.9 8.9 - 10.3 mg/dL   Total Protein 6.5 6.5 - 8.1 g/dL   Albumin 3.1 (L) 3.5 - 5.0 g/dL   AST 288 (H) 15 - 41 U/L   ALT 166 (H) 0 - 44 U/L   Alkaline Phosphatase 83 38 - 126 U/L   Total Bilirubin 0.7 0.3 - 1.2 mg/dL   GFR, Estimated 19 (L) >60 mL/min   Anion gap 7 5 - 15  Type and screen Ordered by PROVIDER DEFAULT  Result Value Ref Range   ABO/RH(D) O POS    Antibody Screen POS    Sample Expiration 07/24/2020,2359    Antibody Identification ANTI E    PT AG Type      NEGATIVE FOR E ANTIGEN Performed at [facility], [place] [address], [place], [place] [address]    Unit Number [id number]    Blood Component Type RED CELLS,LR    Unit division 00    Status of Unit ALLOCATED    Transfusion Status OK TO TRANSFUSE    Crossmatch Result COMPATIBLE    Donor AG Type NEGATIVE FOR E ANTIGEN    Unit Number [id number]    Blood Component Type RBC LR PHER1    Unit division 00    Status of Unit ALLOCATED    Transfusion Status OK TO TRANSFUSE    Crossmatch Result COMPATIBLE    Donor AG Type NEGATIVE FOR E ANTIGEN   Prepare RBC (crossmatch)  Result Value Ref Range   Order Confirmation      ORDER PROCESSED BY BLOOD BANK Performed at [place] [address] [place], [place] [address]   BPAM [facility]  Result Value Ref Range   Blood Product Unit Number [id number]    PRODUCT CODE I5027X41    Unit Type and Rh 5100    Blood Product Expiration  Date 287867672094    Blood Product Unit Number [id number]    PRODUCT CODE M5465K35    Unit Type and  Rh 5100    Blood Product Expiration Date [id number]      Treatments: CT-guided microwave ablation of intrahepatic metastasis, right adrenal metastasis (from right renal cell carcinoma primary) and placement of right chest tube secondary to post procedure pneumothorax on 07/21/2020; resolution of pneumothorax with removal of right chest tube on 07/22/2020  Discharge Exam: Blood pressure (!) 154/92, pulse 91, temperature 98.2 F (36.8 C), resp. rate 16, SpO2 91 %.  Patient awake, alert.  Chest with slightly diminished breath sounds right base, left clear.  Heart with regular rate and rhythm.  Abdomen soft, positive bowel sounds, mild right lateral and right flank tenderness to palpation at previous insertion sites.  Extremities with full range of motion.  Some mild paresthesias noted left hand/fingers Disposition: Discharge disposition: 01-Home or Self Care       Discharge Instructions    Call MD for:  difficulty breathing, headache or visual disturbances   Complete by: As directed    Call MD for:  extreme fatigue   Complete by: As directed    Call MD for:  hives   Complete by: As directed    Call MD for:  persistant dizziness or light-headedness   Complete by: As directed    Call MD for:  persistant nausea and vomiting   Complete by: As directed    Call MD for:  redness, tenderness, or signs of infection (pain, swelling, redness, odor or green/yellow discharge around incision site)   Complete by: As directed    Call MD for:  severe uncontrolled pain   Complete by: As directed    Call MD for:  temperature >100.4   Complete by: As directed    Change dressing (specify)   Complete by: As directed    May apply new bandage over puncture sites right flank for the next 2 to 3 days.  May wash sites with soap and water.   Diet - low sodium heart healthy   Complete by: As directed     Discharge instructions   Complete by: As directed    May resume home medications, stay well-hydrated, avoid strenuous activity for the next 3 to 4 days   Driving Restrictions   Complete by: As directed    No driving for the next 24 hours   Increase activity slowly   Complete by: As directed    Lifting restrictions   Complete by: As directed    No heavy lifting for the next 3 to 4 days   May shower / Bathe   Complete by: As directed    May walk up steps   Complete by: As directed      Allergies as of 07/22/2020      Reactions   Ace Inhibitors Swelling   Lisinopril Swelling      Medication List    TAKE these medications   acetaminophen 500 MG tablet Commonly known as: TYLENOL Take 2 tablets (1,000 mg total) by mouth every 6 (six) hours.   amLODipine 5 MG tablet Commonly known as: NORVASC Take 1 tablet (5 mg total) by mouth daily.   aspirin EC 81 MG tablet Take 81 mg by mouth daily. Swallow whole.   atorvastatin 40 MG tablet Commonly known as: LIPITOR Take 1 tablet (40 mg total) by mouth daily.   cholecalciferol 1000 units tablet Commonly known as: VITAMIN D Take 1,000 Units by mouth daily.   clotrimazole-betamethasone cream Commonly known as: LOTRISONE APPLY TO AFFECTED AREA TWICE A DAY   diltiazem 120 MG  24 hr capsule Commonly known as: TIAZAC Take 120 mg by mouth daily.   docusate sodium 100 MG capsule Commonly known as: COLACE Take 1 capsule (100 mg total) by mouth 2 (two) times daily.   ferrous sulfate 325 (65 FE) MG tablet Take 1 tablet (325 mg total) by mouth 2 (two) times daily. Patient needs office visit for further refills What changed:   when to take this  additional instructions   FISH OIL PO Take 1 tablet by mouth daily.   FreeStyle Libre 14 Day Reader Kerrin Mo 1 Act by Does not apply route daily.   FreeStyle Libre 2 Sensor Misc 1 Act by Does not apply route daily.   gabapentin 100 MG capsule Commonly known as: NEURONTIN Take 100 mg by  mouth at bedtime.   gemfibrozil 600 MG tablet Commonly known as: LOPID Take 600 mg by mouth 2 (two) times daily before a meal.   hydrOXYzine 25 MG tablet Commonly known as: ATARAX/VISTARIL Take 25 mg by mouth 3 (three) times daily as needed for anxiety, itching or nausea.   indapamide 1.25 MG tablet Commonly known as: LOZOL Take 1.25 mg by mouth daily.   Insulin Pen Needle 32G X 6 MM Misc 1 Act by Does not apply route daily.   Lantus SoloStar 100 UNIT/ML Solostar Pen Generic drug: insulin glargine Inject 30 Units into the skin daily as needed (blood sugar over 200).   ONE TOUCH ULTRA TEST test strip Generic drug: glucose blood USE 2 (TWO) TIMES DAILY. AND LANCETS 2/DAY 250.03   OVER THE COUNTER MEDICATION Take 1 tablet by mouth 2 (two) times daily. Focus factor   polyvinyl alcohol 1.4 % ophthalmic solution Commonly known as: LIQUIFILM TEARS Place 1 drop into both eyes as needed for dry eyes.   solifenacin 5 MG tablet Commonly known as: VESICARE Take 1 tablet (5 mg total) by mouth daily.   tamsulosin 0.4 MG Caps capsule Commonly known as: FLOMAX TAKE 1 CAPSULE (0.4 MG TOTAL) BY MOUTH DAILY.   traMADol 50 MG tablet Commonly known as: ULTRAM Take 50-100 mg by mouth every 6 (six) hours as needed for pain.   VICKS SINEX NA Place 1 spray into the nose as needed (congestion).            Discharge Care Instructions  (From admission, onward)         Start     Ordered   07/22/20 0000  Change dressing (specify)       Comments: May apply new bandage over puncture sites right flank for the next 2 to 3 days.  May wash sites with soap and water.   07/22/20 1304          Follow-up Information    Criselda Peaches, MD Follow up.   Specialties: Interventional Radiology, Radiology Why: Radiology will call you with follow-up with Dr. Laurence Ferrari in 2 weeks; call [phone] or [phone] with any questions Contact information: [address] [place] [address] [phone]        [name], MD Follow up.   Specialty: Urology Why: Follow-up with Dr. Louis Meckel as scheduled Contact information: [place] [address] [phone]                Electronically Signed: D. Rowe Robert, PA-C 07/22/2020, 1:07 PM   I have spent Less Than 30 Minutes discharging Rockney Ghee.

## 2020-07-22 NOTE — Progress Notes (Signed)
Pt refusing morning meds.  States he would take the BP medications and colace but wanted to save the rest for when he got home.

## 2020-07-22 NOTE — Progress Notes (Signed)
Removed foley catheter per order, pt tolerated well. Peri care completed.  

## 2020-07-22 NOTE — Progress Notes (Signed)
Patient oxygen saturations sustained in the upper 90's while ambulating on RA.  Went over discharge papers with patient.  All questions answered.  VSS.  Pt wheeled out by NT.

## 2020-07-22 NOTE — Discharge Instructions (Signed)
Microwave Ablation of Liver Tumors, Care After This sheet gives you information about how to care for yourself after your procedure. Your health care provider may also give you more specific instructions. If you have problems or questions, contact your health care provider. What can I expect after the procedure? After your procedure, it is common to have pain and discomfort in the upper abdomen. You will be given pain medicines to control this. Follow these instructions at home: Incision care  Follow instructions from your health care provider about how to take care of your incision. Make sure you: ? Wash your hands with soap and water before you change your bandage (dressing). If soap and water are not available, use hand sanitizer. ? Change your dressing as told by your health care provider. ? Leave stitches (sutures), skin glue, or adhesive strips in place. These skin closures may need to stay in place for 2 weeks or longer. If adhesive strip edges start to loosen and curl up, you may trim the loose edges. Do not remove adhesive strips completely unless your health care provider tells you to do that.  Check your incision area every day for signs of infection. Check for: ? Redness, swelling, or pain. ? Fluid or blood. ? Warmth. ? Pus or a bad smell.   Medicines  Take over-the-counter and prescription medicines only as told by your health care provider.  If you were prescribed an antibiotic medicine, use it as told by your health care provider. Do not stop using the antibiotic even if you start to feel better.  Do not drive or use heavy machinery while taking prescription pain medicine. Activity  Rest as often as needing during the first few days of recovery.  Do not lift anything that is heavier than 10 lbs (4.5 kg), or the limit that your health care provider tells you, until he or she says that it is safe.  Do not drive for 24 hours after the procedure if you were given a medicine  to help you relax (sedative). General instructions  Do not take baths, swim, or use a hot tub until your health care provider approves.  Follow any special diet instructions as directed by your health care provider.  To prevent or treat constipation while you are taking prescription pain medicine, your health care provider may recommend that you: ? Drink enough fluid to keep your urine clear or pale yellow. ? Take over-the-counter or prescription medicines. ? Eat foods that are high in fiber, such as fresh fruits and vegetables, whole grains, and beans. ? Limit foods that are high in fat and processed sugars, such as fried and sweet foods.  Do not use any products that contain nicotine or tobacco, such as cigarettes and e-cigarettes. If you need help quitting, ask your health care provider.  Keep all follow-up visits as told by your health care provider. This is important. Contact a health care provider if:  You cannot pass gas.  You are unable to have a bowel movement within 2 days.  You have a skin rash. Get help right away if:  You have a fever.  You have severe or lasting pain in your abdomen, shoulder, or back.  You have trouble swallowing or breathing.  You have severe weakness or dizziness.  You have chest pain or shortness of breath. Summary  After your procedure, it is common to have pain and discomfort in the upper abdomen. You will be given pain medicines to control this.  Do  not take baths, swim, or use a hot tub until your health care provider approves.  Do not drive or use heavy machinery while taking prescription pain medicine. This information is not intended to replace advice given to you by your health care provider. Make sure you discuss any questions you have with your health care provider. Document Revised: 04/13/2017 Document Reviewed: 07/20/2016 Elsevier Patient Education  [place].

## 2020-07-25 LAB — TYPE AND SCREEN
ABO/RH(D): O POS
Antibody Screen: POSITIVE
Donor AG Type: NEGATIVE
Donor AG Type: NEGATIVE
PT AG Type: NEGATIVE
Unit division: 0
Unit division: 0

## 2020-07-25 LAB — BPAM RBC
Blood Product Expiration Date: 202204072359
Blood Product Expiration Date: 202204072359
Unit Type and Rh: 5100
Unit Type and Rh: 5100

## 2020-08-02 ENCOUNTER — Ambulatory Visit (INDEPENDENT_AMBULATORY_CARE_PROVIDER_SITE_OTHER): Payer: Medicare Other | Admitting: *Deleted

## 2020-08-02 DIAGNOSIS — E118 Type 2 diabetes mellitus with unspecified complications: Secondary | ICD-10-CM

## 2020-08-02 DIAGNOSIS — I10 Essential (primary) hypertension: Secondary | ICD-10-CM | POA: Diagnosis not present

## 2020-08-02 NOTE — Patient Instructions (Signed)
Visit Information  Antonio Adams, it was nice talking with you today.   Please read over the attached information, and begin checking your blood sugars twice a day and your blood pressure twice a week, as we discussed   I re-contacted the ENT provider that Dr. Ronnald Ramp referred you to and asked that they re-contact you to schedule an appointment- please listen out for a call form their office   I look forward to talking to you again for an update on Thursday, August 19, 2020 at 10:00 am- please be listening out for my call that day and have your medications ready for [place] to review during our call.  I will call as close to 10:00 am as possible; I look forward to hearing about your progress.   Please don't hesitate to contact me if I can be of assistance to you before our next scheduled appointment.   Oneta Rack, RN, BSN, [facility] RN Care Coordination- [place] [phone]: direct office [phone]: mobile    PATIENT GOALS:  Goals Addressed            This Visit's Progress   . Monitor and Manage My Blood Sugar-Diabetes Type 2   On track    Timeframe:  Long-Range Goal Priority:  Medium Start Date:            08/02/20                 Expected End Date:  02/02/21                     Follow Up Date 08/19/20   . Check blood sugar at least twice a day: first thing in the morning before you eat, and again 2 hours after a large meal: write these two values down in your notebook . Check blood sugar if I feel it is too high or too low . Enter blood sugar readings and use of insulin into daily log . Take the blood sugar log to all doctor visits  . Call your doctor or care team if you develop concerns or questions about your blood sugar readings   Why is this important?    Checking your blood sugar at home helps to keep it from getting very high or very low.   Writing the results in a diary or log helps the doctor know how to care for you.   Your blood  sugar log should have the time, date and the results.   Also, write down the amount of insulin or other medicine that you take.   Other information, like what you ate, exercise done and how you were feeling, will also be helpful.         .           . Track and Manage My Blood Pressure-Hypertension   On track    Timeframe:  Long-Range Goal Priority:  Medium Start Date:            08/02/20                 Expected End Date:         02/02/21              Follow Up Date 08/19/20   . Check blood pressure twice weekly . Write blood pressure results in a log or diary  . Continue your efforts to limit salt in your diet   Why is this  important?    You won't feel high blood pressure, but it can still hurt your blood vessels.   High blood pressure can cause heart or kidney problems. It can also cause a stroke.   Making lifestyle changes like losing a little weight or eating less salt will help.   Checking your blood pressure at home and at different times of the day can help to control blood pressure.   If the doctor prescribes medicine remember to take it the way the doctor ordered.   Call the office if you cannot afford the medicine or if there are questions about it.         .               Diabetes Mellitus and Nutrition, Adult When you have diabetes, or diabetes mellitus, it is very important to have healthy eating habits because your blood sugar (glucose) levels are greatly affected by what you eat and drink. Eating healthy foods in the right amounts, at about the same times every day, can help you:  Control your blood glucose.  Lower your risk of heart disease.  Improve your blood pressure.  Reach or maintain a healthy weight. What can affect my meal plan? Every person with diabetes is different, and each person has different needs for a meal plan. Your health care provider may recommend that you work with a dietitian to make a meal plan that is best for you. Your  meal plan may vary depending on factors such as:  The calories you need.  The medicines you take.  Your weight.  Your blood glucose, blood pressure, and cholesterol levels.  Your activity level.  Other health conditions you have, such as heart or kidney disease. How do carbohydrates affect me? Carbohydrates, also called carbs, affect your blood glucose level more than any other type of food. Eating carbs naturally raises the amount of glucose in your blood. Carb counting is a method for keeping track of how many carbs you eat. Counting carbs is important to keep your blood glucose at a healthy level, especially if you use insulin or take certain oral diabetes medicines. It is important to know how many carbs you can safely have in each meal. This is different for every person. Your dietitian can help you calculate how many carbs you should have at each meal and for each snack. How does alcohol affect me? Alcohol can cause a sudden decrease in blood glucose (hypoglycemia), especially if you use insulin or take certain oral diabetes medicines. Hypoglycemia can be a life-threatening condition. Symptoms of hypoglycemia, such as sleepiness, dizziness, and confusion, are similar to symptoms of having too much alcohol.  Do not drink alcohol if: ? Your health care provider tells you not to drink. ? You are pregnant, may be pregnant, or are planning to become pregnant.  If you drink alcohol: ? Do not drink on an empty stomach. ? Limit how much you use to:  0-1 drink a day for women.  0-2 drinks a day for men. ? Be aware of how much alcohol is in your drink. In the [place] one drink equals one 12 oz bottle of beer (355 mL), one 5 oz glass of wine (148 mL), or one 1 oz glass of hard liquor (44 mL). ? Keep yourself hydrated with water, diet soda, or unsweetened iced tea.  Keep in mind that regular soda, juice, and other mixers may contain a lot of sugar and must be counted as carbs.  What are  tips for following this plan? Reading food labels  Start by checking the serving size on the "Nutrition Facts" label of packaged foods and drinks. The amount of calories, carbs, fats, and other nutrients listed on the label is based on one serving of the item. Many items contain more than one serving per package.  Check the total grams (g) of carbs in one serving. You can calculate the number of servings of carbs in one serving by dividing the total carbs by 15. For example, if a food has 30 g of total carbs per serving, it would be equal to 2 servings of carbs.  Check the number of grams (g) of saturated fats and trans fats in one serving. Choose foods that have a low amount or none of these fats.  Check the number of milligrams (mg) of salt (sodium) in one serving. Most people should limit total sodium intake to less than 2,300 mg per day.  Always check the nutrition information of foods labeled as "low-fat" or "nonfat." These foods may be higher in added sugar or refined carbs and should be avoided.  Talk to your dietitian to identify your daily goals for nutrients listed on the label. Shopping  Avoid buying canned, pre-made, or processed foods. These foods tend to be high in fat, sodium, and added sugar.  Shop around the outside edge of the grocery store. This is where you will most often find fresh fruits and vegetables, bulk grains, fresh meats, and fresh dairy. Cooking  Use low-heat cooking methods, such as baking, instead of high-heat cooking methods like deep frying.  Cook using healthy oils, such as olive, canola, or sunflower oil.  Avoid cooking with butter, cream, or high-fat meats. Meal planning  Eat meals and snacks regularly, preferably at the same times every day. Avoid going long periods of time without eating.  Eat foods that are high in fiber, such as fresh fruits, vegetables, beans, and whole grains. Talk with your dietitian about how many servings of carbs you can  eat at each meal.  Eat 4-6 oz (112-168 g) of lean protein each day, such as lean meat, chicken, fish, eggs, or tofu. One ounce (oz) of lean protein is equal to: ? 1 oz (28 g) of meat, chicken, or fish. ? 1 egg. ?  cup (62 g) of tofu.  Eat some foods each day that contain healthy fats, such as avocado, nuts, seeds, and fish.   What foods should I eat? Fruits Berries. Apples. Oranges. Peaches. Apricots. Plums. Grapes. Mango. Papaya. Pomegranate. Kiwi. Cherries. Vegetables Lettuce. Spinach. Leafy greens, including kale, chard, collard greens, and mustard greens. Beets. Cauliflower. Cabbage. Broccoli. Carrots. Green beans. Tomatoes. Peppers. Onions. Cucumbers. Brussels sprouts. Grains Whole grains, such as whole-wheat or whole-grain bread, crackers, tortillas, cereal, and pasta. Unsweetened oatmeal. Quinoa. Brown or wild rice. Meats and other proteins Seafood. Poultry without skin. Lean cuts of poultry and beef. Tofu. Nuts. Seeds. Dairy Low-fat or fat-free dairy products such as milk, yogurt, and cheese. The items listed above may not be a complete list of foods and beverages you can eat. Contact a dietitian for more information. What foods should I avoid? Fruits Fruits canned with syrup. Vegetables Canned vegetables. Frozen vegetables with butter or cream sauce. Grains Refined white flour and flour products such as bread, pasta, snack foods, and cereals. Avoid all processed foods. Meats and other proteins Fatty cuts of meat. Poultry with skin. Breaded or fried meats. Processed meat. Avoid saturated fats. Dairy [place]  yogurt, cheese, or milk. Beverages Sweetened drinks, such as soda or iced tea. The items listed above may not be a complete list of foods and beverages you should avoid. Contact a dietitian for more information. Questions to ask a health care provider  Do I need to meet with a diabetes educator?  Do I need to meet with a dietitian?  What number can I call if I  have questions?  When are the best times to check my blood glucose? Where to find more information:  American Diabetes Association: diabetes.org  Academy of Nutrition and Dietetics: www.eatright.[employer] of Diabetes and Digestive and Kidney Diseases: [place]inesFuneral.dk  Association of Diabetes Care and Education Specialists: www.diabeteseducator.org Summary  It is important to have healthy eating habits because your blood sugar (glucose) levels are greatly affected by what you eat and drink.  A healthy meal plan will help you control your blood glucose and maintain a healthy lifestyle.  Your health care provider may recommend that you work with a dietitian to make a meal plan that is best for you.  Keep in mind that carbohydrates (carbs) and alcohol have immediate effects on your blood glucose levels. It is important to count carbs and to use alcohol carefully. This information is not intended to replace advice given to you by your health care provider. Make sure you discuss any questions you have with your health care provider. Document Revised: 04/08/2019 Document Reviewed: 04/08/2019 Elsevier Patient Education  2021 [place].   [email or web address]">  DASH Eating Plan DASH stands for Dietary Approaches to Stop Hypertension. The DASH eating plan is a healthy eating plan that has been shown to:  Reduce high blood pressure (hypertension).  Reduce your risk for type 2 diabetes, heart disease, and stroke.  Help with weight loss. What are tips for following this plan? Reading food labels  Check food labels for the amount of salt (sodium) per serving. Choose foods with less than 5 percent of the Daily Value of sodium. Generally, foods with less than 300 milligrams (mg) of sodium per serving fit into this eating plan.  To find whole grains, look for the word "whole" as the first word in the ingredient  list. Shopping  Buy products labeled as "low-sodium" or "no salt added."  Buy fresh foods. Avoid canned foods and pre-made or frozen meals. Cooking  Avoid adding salt when cooking. Use salt-free seasonings or herbs instead of table salt or sea salt. Check with your health care provider or pharmacist before using salt substitutes.  Do not fry foods. Cook foods using healthy methods such as baking, boiling, grilling, roasting, and broiling instead.  Cook with heart-healthy oils, such as olive, canola, avocado, soybean, or sunflower oil. Meal planning  Eat a balanced diet that includes: ? 4 or more servings of fruits and 4 or more servings of vegetables each day. Try to fill one-half of your plate with fruits and vegetables. ? 6-8 servings of whole grains each day. ? Less than 6 oz (170 g) of lean meat, poultry, or fish each day. A 3-oz (85-g) serving of meat is about the same size as a deck of cards. One egg equals 1 oz (28 g). ? 2-3 servings of low-fat dairy each day. One serving is 1 cup (237 mL). ? 1 serving of nuts, seeds, or beans 5 times each week. ? 2-3 servings of heart-healthy fats. Healthy fats called omega-3 fatty acids are found in foods such as walnuts, flaxseeds, fortified milks, and eggs. These  fats are also found in cold-water fish, such as sardines, salmon, and mackerel.  Limit how much you eat of: ? Canned or prepackaged foods. ? Food that is high in trans fat, such as some fried foods. ? Food that is high in saturated fat, such as fatty meat. ? Desserts and other sweets, sugary drinks, and other foods with added sugar. ? Full-fat dairy products.  Do not salt foods before eating.  Do not eat more than 4 egg yolks a week.  Try to eat at least 2 vegetarian meals a week.  Eat more home-cooked food and less restaurant, buffet, and fast food.   Lifestyle  When eating at a restaurant, ask that your food be prepared with less salt or no salt, if possible.  If you  drink alcohol: ? Limit how much you use to:  0-1 drink a day for women who are not pregnant.  0-2 drinks a day for men. ? Be aware of how much alcohol is in your drink. In the [place] one drink equals one 12 oz bottle of beer (355 mL), one 5 oz glass of wine (148 mL), or one 1 oz glass of hard liquor (44 mL). General information  Avoid eating more than 2,300 mg of salt a day. If you have hypertension, you may need to reduce your sodium intake to 1,500 mg a day.  Work with your health care provider to maintain a healthy body weight or to lose weight. Ask what an ideal weight is for you.  Get at least 30 minutes of exercise that causes your heart to beat faster (aerobic exercise) most days of the week. Activities may include walking, swimming, or biking.  Work with your health care provider or dietitian to adjust your eating plan to your individual calorie needs. What foods should I eat? Fruits All fresh, dried, or frozen fruit. Canned fruit in natural juice (without added sugar). Vegetables Fresh or frozen vegetables (raw, steamed, roasted, or grilled). Low-sodium or reduced-sodium tomato and vegetable juice. Low-sodium or reduced-sodium tomato sauce and tomato paste. Low-sodium or reduced-sodium canned vegetables. Grains Whole-grain or whole-wheat bread. Whole-grain or whole-wheat pasta. Brown rice. [place]. Bulgur. Whole-grain and low-sodium cereals. Pita bread. Low-fat, low-sodium crackers. Whole-wheat flour tortillas. Meats and other proteins Skinless chicken or [place]. Ground chicken or [place]. Pork with fat trimmed off. Fish and seafood. Egg whites. Dried beans, peas, or lentils. Unsalted nuts, nut butters, and seeds. Unsalted canned beans. Lean cuts of beef with fat trimmed off. Low-sodium, lean precooked or cured meat, such as sausages or meat loaves. Dairy Low-fat (1%) or fat-free (skim) milk. Reduced-fat, low-fat, or fat-free cheeses. Nonfat, low-sodium ricotta or cottage  cheese. Low-fat or nonfat yogurt. Low-fat, low-sodium cheese. Fats and oils Soft margarine without trans fats. Vegetable oil. Reduced-fat, low-fat, or light mayonnaise and salad dressings (reduced-sodium). Canola, safflower, olive, avocado, soybean, and sunflower oils. Avocado. Seasonings and condiments Herbs. Spices. Seasoning mixes without salt. Other foods Unsalted popcorn and pretzels. Fat-free sweets. The items listed above may not be a complete list of foods and beverages you can eat. Contact a dietitian for more information. What foods should I avoid? Fruits Canned fruit in a light or heavy syrup. Fried fruit. Fruit in cream or butter sauce. Vegetables Creamed or fried vegetables. Vegetables in a cheese sauce. Regular canned vegetables (not low-sodium or reduced-sodium). Regular canned tomato sauce and paste (not low-sodium or reduced-sodium). Regular tomato and vegetable juice (not low-sodium or reduced-sodium). Angie Fava. Olives. Grains Baked goods made with fat,  such as croissants, muffins, or some breads. Dry pasta or rice meal packs. Meats and other proteins Fatty cuts of meat. Ribs. Fried meat. Bernie[place] Salines. Bologna, salami, and other precooked or cured meats, such as sausages or meat loaves. Fat from the back of a pig (fatback). Bratwurst. Salted nuts and seeds. Canned beans with added salt. Canned or smoked fish. Whole eggs or egg yolks. Chicken or [place] with skin. Dairy Whole or 2% milk, cream, and half-and-half. Whole or full-fat cream cheese. Whole-fat or sweetened yogurt. Full-fat cheese. Nondairy creamers. Whipped toppings. Processed cheese and cheese spreads. Fats and oils Butter. Stick margarine. Lard. Shortening. Ghee. Bacon fat. Tropical oils, such as coconut, palm kernel, or palm oil. Seasonings and condiments Onion salt, garlic salt, seasoned salt, table salt, and sea salt. Worcestershire sauce. Tartar sauce. Barbecue sauce. Teriyaki sauce. Soy sauce, including reduced-sodium.  Steak sauce. Canned and packaged gravies. Fish sauce. Oyster sauce. Cocktail sauce. Store-bought horseradish. Ketchup. Mustard. Meat flavorings and tenderizers. Bouillon cubes. Hot sauces. Pre-made or packaged marinades. Pre-made or packaged taco seasonings. Relishes. Regular salad dressings. Other foods Salted popcorn and pretzels. The items listed above may not be a complete list of foods and beverages you should avoid. Contact a dietitian for more information. Where to find more information  National Heart, Lung, and Blood Institute: https://wilson-eaton.com/  American Heart Association: www.heart.org  Academy of Nutrition and Dietetics: www.eatright.[place]: www.kidney.org Summary  The DASH eating plan is a healthy eating plan that has been shown to reduce high blood pressure (hypertension). It may also reduce your risk for type 2 diabetes, heart disease, and stroke.  When on the DASH eating plan, aim to eat more fresh fruits and vegetables, whole grains, lean proteins, low-fat dairy, and heart-healthy fats.  With the DASH eating plan, you should limit salt (sodium) intake to 2,300 mg a day. If you have hypertension, you may need to reduce your sodium intake to 1,500 mg a day.  Work with your health care provider or dietitian to adjust your eating plan to your individual calorie needs. This information is not intended to replace advice given to you by your health care provider. Make sure you discuss any questions you have with your health care provider. Document Revised: 04/04/2019 Document Reviewed: 04/04/2019 Elsevier Patient Education  2021 [place].   Consent to CCM Services: Antonio Adams was given information about Chronic Care Management services today including:  1. CCM service includes personalized support from designated clinical staff supervised by his physician, including individualized plan of care and coordination with other care providers 2. 24/7  contact phone numbers for assistance for urgent and routine care needs. 3. Service will only be billed when office clinical staff spend 20 minutes or more in a month to coordinate care. 4. Only one practitioner may furnish and bill the service in a calendar month. 5. The patient may stop CCM services at any time (effective at the end of the month) by phone call to the office staff. 6. The patient will be responsible for cost sharing (co-pay) of up to 20% of the service fee (after annual deductible is met).  Patient agreed to services and verbal consent obtained.   The patient verbalized understanding of instructions, educational materials, and care plan provided today and agreed to receive a mailed copy of patient instructions, educational materials, and care plan-- patient reports he does not use MYChart often due to not knowing how to use computer very well.   Telephone follow up appointment  with care management team member scheduled for: Thursday August 19, 2020 at 10:00 am The patient has been provided with contact information for the care management team and has been advised to call with any health related questions or concerns.   Oneta Rack, RN, BSN, [facility] RN Care Coordination- [place] [phone]: direct office [phone]: mobile    CLINICAL CARE PLAN: Patient Care Plan: Diabetes Type 2 (Adult)    Problem Identified: Glycemic Management (Diabetes, Type 2)   Priority: Medium    Long-Range Goal: Glycemic Management Optimized   Start Date: 08/02/2020  Expected End Date: 02/02/2021  This Visit's Progress: On track  Priority: Medium  Note:   Objective:  Lab Results  Component Value Date   HGBA1C 7.6 (H) 07/12/2020 .   Lab Results  Component Value Date   CREATININE 3.26 (H) 07/22/2020   CREATININE 3.49 (H) 07/21/2020   CREATININE 3.54 (H) 07/12/2020 .   Marland Kitchen No results found for: EGFR Current Barriers:  Marland Kitchen Knowledge Deficits related  to basic Diabetes pathophysiology and self care/management . Self-care deficits: none identified Case Manager Clinical Goal(s):  Over the next 6 months, patient will: demonstrate improved adherence to prescribed treatment plan for diabetes self care/management as evidenced by:  . daily monitoring and recording of CBG   . adherence to ADA/ carb modified diet  . contacting provider for new or worsened symptoms or questions Interventions:  . Collaboration with [name], MD regarding development and update of comprehensive plan of care as evidenced by provider attestation and co-signature . Inter-disciplinary care team collaboration (see longitudinal plan of care) . Provided education to patient about basic DM disease process, including long term complications of high blood sugar levels . Discussed medications with patient and importance of medication adherence: confirmed patient taking insulin as prescribed . Discussed plans with patient for ongoing care management follow up and provided patient with direct contact information for care management team . Reviewed scheduled/upcoming provider appointments including: need to schedule follow up appointment with Interventional Radiologist, urology provider, post- recent procedure- provided patient with contact information for providers . Advised patient, providing education and rationale, to monitor/ record blood sugars using CGM (Free-Style Arial), calling care providers for findings outside established parameters . Reviewed with patient recent blood sugars at home: encouraged patient to monitor/ record blood sugars twice daily: am fasting and 2-hours post-prandial: patient reports blood sugars over last week ranging between 104-215; confirmed no recent low blood sugars at home < 80  . Initiated conversation around dietary strategies to improve blood sugars at home: discussed barrier of living alone, challenges of preparing food for one  person . Placed care coordination outreach to recently placed ENT referral on 05/20/20: Left detailed voice message on referral line; requested that patient be contacted again to schedule appointment Self-Care Activities Self administers oral medications and insulin as prescribed Attends all scheduled provider appointments Checks blood sugars as prescribed  Attempts to adhere to prescribed ADA/carb modified-- will need ongoing education/ reinforcement of strategies for adherence Patient Goals: . Check blood sugar at least twice a day: first thing in the morning before you eat, and again 2 hours after a large meal: write these two values down in your notebook . Check blood sugar if I feel it is too high or too low . Enter blood sugar readings and use of insulin into daily log . Take the blood sugar log to all doctor visits  . Call your doctor or  care team if you develop concerns or questions about your blood sugar readings Follow Up Plan:  . Telephone follow up appointment with care management team member scheduled for: Thursday, August 19, 2020 at 10:00 am . The patient has been provided with contact information for the care management team and has been advised to call with any health related questions or concerns.     Patient Care Plan: Hypertension (Adult)    Problem Identified: Hypertension (Hypertension)   Priority: Medium    Long-Range Goal: Hypertension Monitored   Start Date: 08/02/2020  Expected End Date: 02/02/2021  This Visit's Progress: On track  Priority: Medium  Note:   Objective:  . Last practice recorded BP readings:  BP Readings from Last 3 Encounters:  07/22/20 (!) 154/92  07/12/20 (!) 160/89  05/20/20 (!) 152/78   . Most recent eGFR/CrCl: No results found for: EGFR  No components found for: CRCL Current Barriers:  Marland Kitchen Knowledge Deficits related to basic understanding of hypertension pathophysiology and self care management . Self care deficits: none identified today;  barriers include challenges of preparing meals at home for one person (lives alone); multiple chronic conditions to manage, including diabetes and cancer Case Manager Clinical Goal(s):  Over the next 6 months, patient will demonstrate improved adherence to prescribed treatment plan for hypertension as evidenced by  . taking all medications as prescribed . monitoring and recording blood pressure at home twice weekly . adhering to low sodium/DASH diet Interventions:  . Collaboration with [name], MD regarding development and update of comprehensive plan of care as evidenced by provider attestation and co-signature . Inter-disciplinary care team collaboration (see longitudinal plan of care) . Evaluation of current treatment plan related to hypertension self management and patient's adherence to plan as established by provider. . Provided education to patient re:  complications of uncontrolled blood pressure; DASH diet  . Reviewed newly prescribed medications for BP management with patient and discussed importance of compliance . Advised patient, providing education and rationale, to monitor/ record blood pressure twice a week, calling PCP/ RN CM for concerns or questions . Reviewed recently recorded blood sugars at home with patient: he reports BP ranges consistently between 140-150/78-84 . Discussed plans with patient for ongoing care management follow up and provided patient with direct contact information for care management team Self-Care Activities: . Self administers medications as prescribed . Attends all scheduled provider appointments . Calls provider office for new concerns/ questions . Agrees to monitor/ record BP twice weekly for now . Attempts to follow a low sodium diet/DASH diet: will need ongoing reinforcement/ education Patient Goals: . Check blood pressure twice weekly . Write blood pressure results in a log or diary  . Continue your efforts to limit salt in your  diet Follow Up Plan:  . Telephone follow up appointment with care management team member scheduled for: August 19, 2020 at 10:00 am . The patient has been provided with contact information for the care management team and has been advised to call with any health related questions or concerns.

## 2020-08-02 NOTE — Chronic Care Management (AMB) (Signed)
Chronic Care Management   CCM RN Visit Note  08/02/2020 Name: Antonio Adams MRN: [id number] DOB: [birth year]  Subjective: Antonio Adams is a 75 y.o. year old male who is a primary care patient of [name], MD. The care management team was consulted for assistance with disease management and care coordination needs.    Engaged with patient by telephone for initial visit in response to provider referral for case management and/or care coordination services.   Consent to Services:  The patient was given the following information about Chronic Care Management services today, agreed to services, and gave verbal consent:  1. CCM service includes personalized support from designated clinical staff supervised by the primary care provider, including individualized plan of care and coordination with other care providers  2. 24/7 contact phone numbers for assistance for urgent and routine care needs.  3. Service will only be billed when office clinical staff spend 20 minutes or more in a month to coordinate care.  4. Only one practitioner may furnish and bill the service in a calendar month.  5.The patient may stop CCM services at any time (effective at the end of the month) by phone call to the office staff.  6. The patient will be responsible for cost sharing (co-pay) of up to 20% of the service fee (after annual deductible is met). Patient agreed to services and consent obtained.  Assessment: Review of patient past medical history, allergies, medications, health status, including review of consultants reports, laboratory and other test data, was performed as part of comprehensive evaluation and provision of chronic care management services.   SDOH (Social Determinants of Health) assessments and interventions performed:  SDOH Interventions   Flowsheet Row Most Recent Value  SDOH Interventions   Food Insecurity Interventions Intervention Not Indicated  Financial Strain  Interventions Intervention Not Indicated  Transportation Interventions Intervention Not Indicated      CCM Care Plan  Allergies  Allergen Reactions  . Ace Inhibitors Swelling  . Lisinopril Swelling    Outpatient Encounter Medications as of 08/02/2020  Medication Sig  . acetaminophen (TYLENOL) 500 MG tablet Take 2 tablets (1,000 mg total) by mouth every 6 (six) hours. (Patient not taking: No sig reported)  . amLODipine (NORVASC) 5 MG tablet Take 1 tablet (5 mg total) by mouth daily.  Marland Kitchen aspirin EC 81 MG tablet Take 81 mg by mouth daily. Swallow whole.  Marland Kitchen atorvastatin (LIPITOR) 40 MG tablet Take 1 tablet (40 mg total) by mouth daily.  . cholecalciferol (VITAMIN D) 1000 units tablet Take 1,000 Units by mouth daily.  . clotrimazole-betamethasone (LOTRISONE) cream APPLY TO AFFECTED AREA TWICE A DAY (Patient not taking: Reported on 07/12/2020)  . Continuous Blood Gluc Receiver (FREESTYLE LIBRE 14 DAY READER) DEVI 1 Act by Does not apply route daily.  . Continuous Blood Gluc Sensor (FREESTYLE LIBRE 2 SENSOR) MISC 1 Act by Does not apply route daily.  Marland Kitchen diltiazem (TIAZAC) 120 MG 24 hr capsule Take 120 mg by mouth daily.  Marland Kitchen docusate sodium (COLACE) 100 MG capsule Take 1 capsule (100 mg total) by mouth 2 (two) times daily.  . ferrous sulfate 325 (65 FE) MG tablet Take 1 tablet (325 mg total) by mouth 2 (two) times daily. Patient needs office visit for further refills (Patient taking differently: Take 325 mg by mouth daily with breakfast.)  . gabapentin (NEURONTIN) 100 MG capsule Take 100 mg by mouth at bedtime.  Marland Kitchen gemfibrozil (LOPID) 600 MG tablet Take 600 mg by mouth 2 (  two) times daily before a meal.  . hydrOXYzine (ATARAX/VISTARIL) 25 MG tablet Take 25 mg by mouth 3 (three) times daily as needed for anxiety, itching or nausea.   . indapamide (LOZOL) 1.25 MG tablet Take 1.25 mg by mouth daily.  . insulin glargine (LANTUS SOLOSTAR) 100 UNIT/ML Solostar Pen Inject 30 Units into the skin daily as  needed (blood sugar over 200).  . Insulin Pen Needle 32G X 6 MM MISC 1 Act by Does not apply route daily.  . Omega-3 Fatty Acids (FISH OIL PO) Take 1 tablet by mouth daily.  . ONE TOUCH ULTRA TEST test strip USE 2 (TWO) TIMES DAILY. AND LANCETS 2/DAY 250.03  . OVER THE COUNTER MEDICATION Take 1 tablet by mouth 2 (two) times daily. Focus factor  . Oxymetazoline HCl (VICKS SINEX NA) Place 1 spray into the nose as needed (congestion).  . polyvinyl alcohol (LIQUIFILM TEARS) 1.4 % ophthalmic solution Place 1 drop into both eyes as needed for dry eyes.  Marland Kitchen solifenacin (VESICARE) 5 MG tablet Take 1 tablet (5 mg total) by mouth daily.  . tamsulosin (FLOMAX) 0.4 MG CAPS capsule TAKE 1 CAPSULE (0.4 MG TOTAL) BY MOUTH DAILY. (Patient taking differently: Take 0.4 mg by mouth daily.)  . traMADol (ULTRAM) 50 MG tablet Take 50-100 mg by mouth every 6 (six) hours as needed for pain.   No facility-administered encounter medications on file as of 08/02/2020.    Patient Active Problem List   Diagnosis Date Noted  . Encounter for general adult medical examination with abnormal findings 05/20/2020  . Tinnitus aurium, bilateral 05/20/2020  . Metastatic renal cell carcinoma ([place]) 02/26/2020  . Diabetic polyneuropathy associated with type 2 diabetes mellitus ([place]) 12/18/2019  . CKD (chronic kidney disease) stage 3, GFR 30-59 ml/min ([facility]) 10/08/2018  . Murmur, cardiac 10/08/2018  . DDD (degenerative disc disease), cervical 10/08/2018  . OAB (overactive bladder) 04/04/2018  . Seasonal allergic rhinitis due to pollen 11/01/2016  . Microalbuminuria due to type 2 diabetes mellitus ([place]) 07/27/2016  . Renal cell carcinoma ([place]) 10/06/2015  . Varicose veins of both lower extremities 08/06/2015  . Varicose veins of lower extremities with ulcer ([place]) 06/16/2015  . Internal hemorrhoid, bleeding 08/03/2014  . Post traumatic stress disorder (PTSD) 01/29/2014  . Deficiency anemia 12/10/2013  . Radiation proctitis  06/04/2012  . Spinal stenosis of lumbar region with radiculopathy 10/30/2011    Class: Chronic  . OSA (obstructive sleep apnea) 11/24/2010  . Obesity, Class I, BMI 30-34.9 11/24/2010  . Routine general medical examination at a health care facility 11/24/2010  . ADENOCARCINOMA, PROSTATE, GLEASON GRADE 6 12/21/2009  . Type II diabetes mellitus with manifestations ([place]) 03/16/2007  . Hyperlipidemia with target LDL less than 100 03/16/2007  . Essential hypertension 03/16/2007  . GERD 03/16/2007    Conditions to be addressed/monitored:HTN, DMII, cancer and CKD Stage III  Care Plan : Diabetes Type 2 (Adult)  Updates made by Knox Royalty, RN since 08/02/2020 12:00 AM    Problem: Glycemic Management (Diabetes, Type 2)   Priority: Medium    Long-Range Goal: Glycemic Management Optimized   Start Date: 08/02/2020  Expected End Date: 02/02/2021  This Visit's Progress: On track  Priority: Medium  Note:   Objective:  Lab Results  Component Value Date   HGBA1C 7.6 (H) 07/12/2020 .   Lab Results  Component Value Date   CREATININE 3.26 (H) 07/22/2020   CREATININE 3.49 (H) 07/21/2020   CREATININE 3.54 (H) 07/12/2020 .   Marland Kitchen No results  found for: EGFR Current Barriers:  Marland Kitchen Knowledge Deficits related to basic Diabetes pathophysiology and self care/management . Self-care deficits: none identified Case Manager Clinical Goal(s):  Over the next 6 months, patient will: demonstrate improved adherence to prescribed treatment plan for diabetes self care/management as evidenced by:  . daily monitoring and recording of CBG   . adherence to ADA/ carb modified diet  . contacting provider for new or worsened symptoms or questions Interventions:  . Collaboration with [name], MD regarding development and update of comprehensive plan of care as evidenced by provider attestation and co-signature . Inter-disciplinary care team collaboration (see longitudinal plan of care) . Provided education to  patient about basic DM disease process, including long term complications of high blood sugar levels . Discussed medications with patient and importance of medication adherence: confirmed patient taking insulin as prescribed . Discussed plans with patient for ongoing care management follow up and provided patient with direct contact information for care management team . Reviewed scheduled/upcoming provider appointments including: need to schedule follow up appointment with Interventional Radiologist, urology provider, post- recent procedure- provided patient with contact information for providers . Advised patient, providing education and rationale, to monitor/ record blood sugars using CGM (Free-Style [place]), calling care providers for findings outside established parameters . Reviewed with patient recent blood sugars at home: encouraged patient to monitor/ record blood sugars twice daily: am fasting and 2-hours post-prandial: patient reports blood sugars over last week ranging between 104-215; confirmed no recent low blood sugars at home < 80  . Initiated conversation around dietary strategies to improve blood sugars at home: discussed barrier of living alone, challenges of preparing food for one person . Placed care coordination outreach to recently placed ENT referral on 05/20/20: Left detailed voice message on referral line; requested that patient be contacted again to schedule appointment Self-Care Activities Self administers oral medications and insulin as prescribed Attends all scheduled provider appointments Checks blood sugars as prescribed  Attempts to adhere to prescribed ADA/carb modified-- will need ongoing education/ reinforcement of strategies for adherence Patient Goals: . Check blood sugar at least twice a day: first thing in the morning before you eat, and again 2 hours after a large meal: write these two values down in your notebook . Check blood sugar if I feel it is too high or  too low . Enter blood sugar readings and use of insulin into daily log . Take the blood sugar log to all doctor visits  . Call your doctor or care team if you develop concerns or questions about your blood sugar readings Follow Up Plan:  . Telephone follow up appointment with care management team member scheduled for: Thursday, August 19, 2020 at 10:00 am . The patient has been provided with contact information for the care management team and has been advised to call with any health related questions or concerns.     Care Plan : Hypertension (Adult)  Updates made by Knox Royalty, RN since 08/02/2020 12:00 AM    Problem: Hypertension (Hypertension)   Priority: Medium    Long-Range Goal: Hypertension Monitored   Start Date: 08/02/2020  Expected End Date: 02/02/2021  This Visit's Progress: On track  Priority: Medium  Note:   Objective:  . Last practice recorded BP readings:  BP Readings from Last 3 Encounters:  07/22/20 (!) 154/92  07/12/20 (!) 160/89  05/20/20 (!) 152/78   . Most recent eGFR/CrCl: No results found for: EGFR  No components found for: CRCL Current  Barriers:  Marland Kitchen Knowledge Deficits related to basic understanding of hypertension pathophysiology and self care management . Self care deficits: none identified today; barriers include challenges of preparing meals at home for one person (lives alone); multiple chronic conditions to manage, including diabetes and cancer Case Manager Clinical Goal(s):  Over the next 6 months, patient will demonstrate improved adherence to prescribed treatment plan for hypertension as evidenced by  . taking all medications as prescribed . monitoring and recording blood pressure at home twice weekly . adhering to low sodium/DASH diet Interventions:  . Collaboration with [name], MD regarding development and update of comprehensive plan of care as evidenced by provider attestation and co-signature . Inter-disciplinary care team  collaboration (see longitudinal plan of care) . Evaluation of current treatment plan related to hypertension self management and patient's adherence to plan as established by provider. . Provided education to patient re:  complications of uncontrolled blood pressure; DASH diet  . Reviewed newly prescribed medications for BP management with patient and discussed importance of compliance . Advised patient, providing education and rationale, to monitor/ record blood pressure twice a week, calling PCP/ RN CM for concerns or questions . Reviewed recently recorded blood sugars at home with patient: he reports BP ranges consistently between 140-150/78-84 . Discussed plans with patient for ongoing care management follow up and provided patient with direct contact information for care management team Self-Care Activities: . Self administers medications as prescribed . Attends all scheduled provider appointments . Calls provider office for new concerns/ questions . Agrees to monitor/ record BP twice weekly for now . Attempts to follow a low sodium diet/DASH diet: will need ongoing reinforcement/ education Patient Goals: . Check blood pressure twice weekly . Write blood pressure results in a log or diary  . Continue your efforts to limit salt in your diet Follow Up Plan:  . Telephone follow up appointment with care management team member scheduled for: August 19, 2020 at 10:00 am . The patient has been provided with contact information for the care management team and has been advised to call with any health related questions or concerns.      Plan:  Telephone follow up appointment with care management team member scheduled for:  August 19, 2020 at 10:00 am  The patient has been provided with contact information for the care management team and has been advised to call with any health related questions or concerns  Oneta Rack, RN, BSN, [place] [phone]: direct office [phone]: mobile

## 2020-08-19 ENCOUNTER — Other Ambulatory Visit: Payer: Self-pay | Admitting: Internal Medicine

## 2020-08-19 ENCOUNTER — Ambulatory Visit (INDEPENDENT_AMBULATORY_CARE_PROVIDER_SITE_OTHER): Payer: Medicare Other | Admitting: *Deleted

## 2020-08-19 DIAGNOSIS — I10 Essential (primary) hypertension: Secondary | ICD-10-CM | POA: Diagnosis not present

## 2020-08-19 DIAGNOSIS — E118 Type 2 diabetes mellitus with unspecified complications: Secondary | ICD-10-CM

## 2020-08-19 DIAGNOSIS — H9313 Tinnitus, bilateral: Secondary | ICD-10-CM

## 2020-08-19 NOTE — Patient Instructions (Signed)
Visit Information  Mr. jc, veron was nice talking with you today.   Please read over the attached information, and keep up the great work monitoring and recording your blood sugars and blood pressures at home- we will review these during each of our phone calls  Please remember that you have a virtual office visit with the gastrointestinal Interventional Radiology (IR) provider: this virtual appointment is scheduled on Wednesday, August 25, 2020 at 2:00 pm; please talk to the IR team about any ongoing issues you are having with post-procedure pain   I look forward to talking to you again for an update on Thursday Sep 16, 2020 at 10:00 am- please be listening out for my call that day.  I will call as close to 10:00 am as possible; I look forward to hearing about your progress.   Please don't hesitate to contact me if I can be of assistance to you before our next scheduled appointment.   Oneta Rack, RN, BSN, [facility] RN Care Coordination- [place] [phone]: direct office [phone]: mobile    '  PATIENT GOALS: Goals Addressed            This Visit's Progress   . Monitor and Manage My Blood Sugar-Diabetes Type 2   On track    Timeframe:  Long-Range Goal Priority:  Medium Start Date:            08/02/20                 Expected End Date:  02/02/21                     Follow Up Date 09/16/20   . Great job checking blood sugars at least twice a day: first thing in the morning before you eat, and again 2 hours after a large meal: write these two values down in your notebook: keep doing this and will review during each of our telephone calls . Check blood sugar if I feel it is too high or too low . Enter blood sugar readings and use of insulin into daily log . Take the blood sugar log to all doctor visits- this will help your doctor know which medications you should be on and how your medications are working  . Call your doctor or care team if  you develop concerns or questions about your blood sugar readings   Why is this important?    Checking your blood sugar at home helps to keep it from getting very high or very low.   Writing the results in a diary or log helps the doctor know how to care for you.   Your blood sugar log should have the time, date and the results.   Also, write down the amount of insulin or other medicine that you take.   Other information, like what you ate, exercise done and how you were feeling, will also be helpful.         . Track and Manage My Blood Pressure-Hypertension   On track    Timeframe:  Long-Range Goal Priority:  Medium Start Date:            08/02/20                 Expected End Date:         02/02/21              Follow Up Date 09/16/20   .  Continue monitoring/ recording blood pressures at home twice weekly . Write blood pressure results in a log or diary- this will help your doctor know if the medicines you are on are working; we will review your blood pressures at home during our future phone calls  . Continue your efforts to limit salt in your diet   Why is this important?    You won't feel high blood pressure, but it can still hurt your blood vessels.   High blood pressure can cause heart or kidney problems. It can also cause a stroke.   Making lifestyle changes like losing a little weight or eating less salt will help.   Checking your blood pressure at home and at different times of the day can help to control blood pressure.   If the doctor prescribes medicine remember to take it the way the doctor ordered.   Call the office if you cannot afford the medicine or if there are questions about it.            Patient verbalizes understanding of instructions provided today and requests that I mail him a copy of today's AVS, as he does not log in very often to his [place] account- this was done per patient request    Telephone follow up appointment with care management  team member scheduled for: Thursday Sep 16, 2020 at 10:00 am  The patient has been provided with contact information for the care management team and has been advised to call with any health related questions or concerns.   Oneta Rack, RN, BSN, [facility] RN Care Coordination- [place] [phone]: direct office [phone]: mobile

## 2020-08-19 NOTE — Chronic Care Management (AMB) (Signed)
Chronic Care Management   CCM RN Visit Note  08/19/2020 Name: Antonio Adams MRN: [id number] DOB: 09-29-1945  Subjective: Antonio Adams is a 75 y.o. year old male who is a primary care patient of [name], MD. The care management team was consulted for assistance with disease management and care coordination needs.    Engaged with patient by telephone for follow up visit in response to provider referral for case management and/or care coordination services.   Consent to Services:  The patient was given information about Chronic Care Management services, agreed to services, and gave verbal consent prior to initiation of services.  Please see initial visit note for detailed documentation.   Assessment: Review of patient past medical history, allergies, medications, health status, including review of consultants reports, laboratory and other test data, was performed as part of comprehensive evaluation and provision of chronic care management services.   SDOH (Social Determinants of Health) assessments and interventions performed:    CCM Care Plan  Allergies  Allergen Reactions  . Ace Inhibitors Swelling  . Lisinopril Swelling    Outpatient Encounter Medications as of 08/19/2020  Medication Sig  . acetaminophen (TYLENOL) 500 MG tablet Take 2 tablets (1,000 mg total) by mouth every 6 (six) hours. (Patient not taking: No sig reported)  . amLODipine (NORVASC) 5 MG tablet Take 1 tablet (5 mg total) by mouth daily.  Marland Kitchen aspirin EC 81 MG tablet Take 81 mg by mouth daily. Swallow whole.  Marland Kitchen atorvastatin (LIPITOR) 40 MG tablet Take 1 tablet (40 mg total) by mouth daily.  . cholecalciferol (VITAMIN D) 1000 units tablet Take 1,000 Units by mouth daily.  . clotrimazole-betamethasone (LOTRISONE) cream APPLY TO AFFECTED AREA TWICE A DAY (Patient not taking: Reported on 07/12/2020)  . Continuous Blood Gluc Receiver (FREESTYLE LIBRE 14 DAY READER) DEVI 1 Act by Does not apply route daily.   . Continuous Blood Gluc Sensor (FREESTYLE LIBRE 2 SENSOR) MISC 1 Act by Does not apply route daily.  Marland Kitchen diltiazem (TIAZAC) 120 MG 24 hr capsule Take 120 mg by mouth daily.  Marland Kitchen docusate sodium (COLACE) 100 MG capsule Take 1 capsule (100 mg total) by mouth 2 (two) times daily.  . ferrous sulfate 325 (65 FE) MG tablet Take 1 tablet (325 mg total) by mouth 2 (two) times daily. Patient needs office visit for further refills (Patient taking differently: Take 325 mg by mouth daily with breakfast.)  . gabapentin (NEURONTIN) 100 MG capsule Take 100 mg by mouth at bedtime.  Marland Kitchen gemfibrozil (LOPID) 600 MG tablet Take 600 mg by mouth 2 (two) times daily before a meal.  . hydrOXYzine (ATARAX/VISTARIL) 25 MG tablet Take 25 mg by mouth 3 (three) times daily as needed for anxiety, itching or nausea.   . indapamide (LOZOL) 1.25 MG tablet Take 1.25 mg by mouth daily.  . insulin glargine (LANTUS SOLOSTAR) 100 UNIT/ML Solostar Pen Inject 30 Units into the skin daily as needed (blood sugar over 200).  . Insulin Pen Needle 32G X 6 MM MISC 1 Act by Does not apply route daily.  . Omega-3 Fatty Acids (FISH OIL PO) Take 1 tablet by mouth daily.  . ONE TOUCH ULTRA TEST test strip USE 2 (TWO) TIMES DAILY. AND LANCETS 2/DAY 250.03  . OVER THE COUNTER MEDICATION Take 1 tablet by mouth 2 (two) times daily. Focus factor  . Oxymetazoline HCl (VICKS SINEX NA) Place 1 spray into the nose as needed (congestion).  . polyvinyl alcohol (LIQUIFILM TEARS) 1.4 % ophthalmic  solution Place 1 drop into both eyes as needed for dry eyes.  Marland Kitchen solifenacin (VESICARE) 5 MG tablet Take 1 tablet (5 mg total) by mouth daily.  . tamsulosin (FLOMAX) 0.4 MG CAPS capsule TAKE 1 CAPSULE (0.4 MG TOTAL) BY MOUTH DAILY. (Patient taking differently: Take 0.4 mg by mouth daily.)  . traMADol (ULTRAM) 50 MG tablet Take 50-100 mg by mouth every 6 (six) hours as needed for pain.   No facility-administered encounter medications on file as of 08/19/2020.    Patient  Active Problem List   Diagnosis Date Noted  . Encounter for general adult medical examination with abnormal findings 05/20/2020  . Tinnitus aurium, bilateral 05/20/2020  . Metastatic renal cell carcinoma ([place]) 02/26/2020  . Diabetic polyneuropathy associated with type 2 diabetes mellitus ([place]) 12/18/2019  . CKD (chronic kidney disease) stage 3, GFR 30-59 ml/min ([facility]) 10/08/2018  . Murmur, cardiac 10/08/2018  . DDD (degenerative disc disease), cervical 10/08/2018  . OAB (overactive bladder) 04/04/2018  . Seasonal allergic rhinitis due to pollen 11/01/2016  . Microalbuminuria due to type 2 diabetes mellitus ([place]) 07/27/2016  . Renal cell carcinoma ([place]) 10/06/2015  . Varicose veins of both lower extremities 08/06/2015  . Varicose veins of lower extremities with ulcer ([place]) 06/16/2015  . Internal hemorrhoid, bleeding 08/03/2014  . Post traumatic stress disorder (PTSD) 01/29/2014  . Deficiency anemia 12/10/2013  . Radiation proctitis 06/04/2012  . Spinal stenosis of lumbar region with radiculopathy 10/30/2011    Class: Chronic  . OSA (obstructive sleep apnea) 11/24/2010  . Obesity, Class I, BMI 30-34.9 11/24/2010  . Routine general medical examination at a health care facility 11/24/2010  . ADENOCARCINOMA, PROSTATE, GLEASON GRADE 6 12/21/2009  . Type II diabetes mellitus with manifestations ([place]) 03/16/2007  . Hyperlipidemia with target LDL less than 100 03/16/2007  . Essential hypertension 03/16/2007  . GERD 03/16/2007    Conditions to be addressed/monitored:HTN and DMII  Care Plan : Diabetes Type 2 (Adult)  Updates made by Knox Royalty, RN since 08/19/2020 12:00 AM    Problem: Glycemic Management (Diabetes, Type 2)   Priority: Medium    Long-Range Goal: Glycemic Management Optimized   Start Date: 08/02/2020  Expected End Date: 02/02/2021  This Visit's Progress: On track  Recent Progress: On track  Priority: Medium  Note:   Objective:  Lab Results  Component Value Date    HGBA1C 7.6 (H) 07/12/2020 .   Lab Results  Component Value Date   CREATININE 3.26 (H) 07/22/2020   CREATININE 3.49 (H) 07/21/2020   CREATININE 3.54 (H) 07/12/2020 .   Marland Kitchen No results found for: EGFR Current Barriers:  Marland Kitchen Knowledge Deficits related to basic Diabetes pathophysiology and self care/management . Self-care deficits: none identified Case Manager Clinical Goal(s):  Over the next 6 months, patient will: demonstrate improved adherence to prescribed treatment plan for diabetes self care/management as evidenced by:  . daily monitoring and recording of CBG   . adherence to ADA/ carb modified diet  . contacting provider for new or worsened symptoms or questions Interventions:  . Collaboration with [name], MD regarding development and update of comprehensive plan of care as evidenced by provider attestation and co-signature . Inter-disciplinary care team collaboration (see longitudinal plan of care) . Reinforced previously provided education to patient about basic DM disease process, including long term complications of high blood sugar levels, importance of home monitoring of blood sugars . 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 Advised patient, providing education and rationale, to continue monitoring/ recording blood sugars using CGM (Free-Style [place]), calling care providers for questions, concerns, consistent high- or low- readings at home . Reviewed with patient recent blood sugars at home: again encouraged patient to monitor/ record blood sugars twice daily: am fasting and 2-hours post-prandial: patient reports fasting blood sugar this morning of "88" stated he felt "a little shaky" and corrected with small drink of orange juice;  confirmed patient feels better now; confirmed no recent low blood sugars at home < 80  . Confirmed patient received previously mailed printed AVS with educational material and encouraged his ongoing review- declines additional educational information today . Placed care coordination outreach to PCP regarding recently placed ENT referral on 05/20/20: made PCP aware that patient has still not yet heard back from ENT provider to schedule visit post- recent care coordination outreach to ENT provider on 08/02/20; requested PCP place another referral today: continues to have ongoing ringing in ears Self-Care Activities Self administers oral medications and insulin as prescribed Attends all scheduled provider appointments Checks blood sugars as prescribed  Attempts to adhere to prescribed ADA/carb modified-- will need ongoing education/ reinforcement of strategies for adherence Patient Goals: Marland Kitchen Great job checking blood sugars at least twice a day: first thing in the morning before you eat, and again 2 hours after a large meal: write these two values down in your notebook: keep doing this and will review during each of our telephone calls . Check blood sugar if I feel it is too high or too low . Enter blood sugar readings and use of insulin into daily log . Take the blood sugar log to all doctor visits- this will help your doctor know which medications you should be on and how your medications are working  . Call your doctor or care team if you develop concerns or questions about your blood sugar readings Follow Up Plan:  . Telephone follow up appointment with care management team member scheduled for: Sep 16, 2020 at 10:00 am . The patient has been provided with contact information for the care management team and has been advised to call with any health related questions or concerns.     Care Plan : Hypertension (Adult)  Updates made by Knox Royalty, RN since 08/19/2020 12:00 AM    Problem: Hypertension  (Hypertension)   Priority: Medium    Long-Range Goal: Hypertension Monitored   Start Date: 08/02/2020  Expected End Date: 02/02/2021  This Visit's Progress: On track  Recent Progress: On track  Priority: Medium  Note:   Objective:  . Last practice recorded BP readings:  BP Readings from Last 3 Encounters:  07/22/20 (!) 154/92  07/12/20 (!) 160/89  05/20/20 (!) 152/78   . Most recent eGFR/CrCl: No results found for: EGFR  No components found for: CRCL Current Barriers:  Marland Kitchen Knowledge Deficits related to basic understanding of hypertension pathophysiology and self care management . Self care deficits: none identified today; barriers include challenges of preparing meals at home for one person (lives alone); multiple chronic conditions to manage, including diabetes and cancer Case Manager Clinical Goal(s):  Over the next 6 months, patient will demonstrate improved adherence to prescribed treatment plan for hypertension as evidenced by  . taking all medications as prescribed . monitoring and recording blood pressure at home  twice weekly . adhering to low sodium/DASH diet Interventions:  . Collaboration with [name], MD regarding development and update of comprehensive plan of care as evidenced by provider attestation and co-signature . Inter-disciplinary care team collaboration (see longitudinal plan of care) . Evaluation of current treatment plan related to hypertension self management and patient's adherence to plan as established by provider. . Reinforced previously provided education to patient re:  complications of uncontrolled blood pressure; DASH diet  . Reinforced importance of medication compliance/ adherence; confirmed no current medication concerns . Advised patient to continue monitoring/ recording blood pressures at home twice a week, calling PCP/ RN CM for concerns or questions . Reviewed recently recorded blood pressures at home with patient: he reports "no changes to  BP" and endorses ongoing ranges consistently between 140-150/78-84 . Discussed plans with patient for ongoing care management follow up and provided patient with direct contact information for care management team Self-Care Activities: . Self administers medications as prescribed . Attends all scheduled provider appointments . Calls provider office for new concerns/ questions . Agrees to monitor/ record BP twice weekly for now . Attempts to follow a low sodium diet/DASH diet: will need ongoing reinforcement/ education Patient Goals: . Continue monitoring/ recording blood pressures at home twice weekly . Write blood pressure results in a log or diary- this will help your doctor know if the medicines you are on are working; we will review your blood pressures at home during our future phone calls  . Continue your efforts to limit salt in your diet Follow Up Plan:  . Telephone follow up appointment with care management team member scheduled for: Sep 16, 2020 at 10:00 am . The patient has been provided with contact information for the care management team and has been advised to call with any health related questions or concerns.       Plan: Telephone follow up appointment with care management team member scheduled for:  Sep 16, 2020 at 10:00 am The patient has been provided with contact information for the care management team and has been advised to call with any health related questions or concerns.   Oneta Rack, RN, BSN, [facility] RN Care Coordination- [place] [phone]: direct office [phone]: mobile

## 2020-08-24 ENCOUNTER — Other Ambulatory Visit: Payer: Self-pay | Admitting: Internal Medicine

## 2020-08-25 ENCOUNTER — Other Ambulatory Visit: Payer: Self-pay

## 2020-08-25 ENCOUNTER — Ambulatory Visit
Admission: RE | Admit: 2020-08-25 | Discharge: 2020-08-25 | Disposition: A | Discharge status: Home / Self Care | Payer: Medicare Other | Source: Ambulatory Visit | Attending: Radiology | Admitting: Radiology

## 2020-08-25 DIAGNOSIS — C641 Malignant neoplasm of right kidney, except renal pelvis: Secondary | ICD-10-CM | POA: Diagnosis not present

## 2020-08-25 DIAGNOSIS — C787 Secondary malignant neoplasm of liver and intrahepatic bile duct: Secondary | ICD-10-CM | POA: Diagnosis not present

## 2020-08-25 DIAGNOSIS — Z9889 Other specified postprocedural states: Secondary | ICD-10-CM | POA: Diagnosis not present

## 2020-08-25 HISTORY — PX: IR RADIOLOGIST EVAL & MGMT: IMG5224

## 2020-08-25 NOTE — Progress Notes (Signed)
Chief Complaint: Patient was seen in follow-up remotely today (TeleHealth) for metastatic papillary renal cell cancer at the request of Allred,Darrell K.    Referring Physician(s): Dr. Burman Nieves  History of Present Illness: Antonio Adams is a 75 y.o. male a history of a 6 cm right papillary renal cell carcinoma resected in April of 2017 (negative margins) but subsequently developed hepatic and adrenal metastatic disease. Biopsy proven metastatic RCC 8/21.   He underwent open partial hepatectomy, renal metastasectomy and adrenalectomy in October of 2021.  By his most recent imaging,(CT 06/10/20) he has persistent hepatic and adrenal/adrenal fossa lesions concerning for ongoing metastatic disease. His case was presented at the multi-disciplinary urological tumor board on 06/25/20.     He underwent percutaneous thermal ablation of the right adrenal and intrahepatic metastatic lesions on 07/21/2020.  Unfortunately, the tiny peritoneal implants along the diaphragm surface in the superior medial aspect of the right hepatic lobe could not be safely targeted.    We conferenced over the telephone today.  Antonio Adams has completely recovered from his procedure.  He denies chest pain, shortness of breath or right upper abdominal/flank pain.  His only complaint is occasional intermittent periumbilical pain which appears to be unrelated.    Past Medical History:  Diagnosis Date  . ADENOCARCINOMA, PROSTATE, GLEASON GRADE 6 12/21/2009   prostate cancer  . ALLERGIC RHINITIS 03/16/2007  . Allergy   . Anxiety   . Arthritis    back   . BACK PAIN WITH RADICULOPATHY 11/23/2008  . Cancer of kidney ([place])    partial right kidney removed  . CARPAL TUNNEL SYNDROME, BILATERAL 03/16/2007  . Chronic back pain   . Constipation    takes Senokot daily  . Depression    occasionally  . DIABETES MELLITUS, TYPE II 03/16/2007   takes Januvia and MEtformin daily  . GASTROINTESTINAL HEMORRHAGE, HX OF 03/16/2007   . GERD 03/16/2007   takes Omeprazole daily  . Glaucoma    mild - no eye drops  . Hemorrhoids   . History of colon polyps   . HYPERLIPIDEMIA 03/16/2007   takes Crestor daily  . HYPERTENSION 03/16/2007   takes Diltiazem and Lisinopril daily   . Kidney cancer, primary, with metastasis from kidney to other site [facility])   . LEG CRAMPS 05/22/2007  . Nocturia   . Overactive bladder   . PARESTHESIA 05/21/2007  . Proctitis   . PTSD (post-traumatic stress disorder)    wakes up may be dreaming of fighting  . Rectal bleeding    Dr.Norins has explained its from the Radiation that he has received  . Rectal bleeding   . Sleep apnea    uses CPAP nightly  . Stomach cancer ([place])   . Urinary frequency    takes Toviaz daily    Past Surgical History:  Procedure Laterality Date  . ADRENALECTOMY Right 02/26/2020   Procedure: OPEN RIGHT RENAL CELL METASTATECTOMY AND  ADRENALECTOMY;  Surgeon: [name], MD;  Location: [facility];  Service: Urology;  Laterality: Right;  . [place], 2008   repeat surgery; ESI '08  . CARPAL TUNNEL RELEASE     bilateral  . CERVICAL FUSION    . COLONOSCOPY    . colonosocpy    . ESOPHAGOGASTRODUODENOSCOPY     ED  . FLEXIBLE SIGMOIDOSCOPY N/A 01/08/2017   Procedure: FLEXIBLE SIGMOIDOSCOPY;  Surgeon: Irene Shipper, MD;  Location: [facility] ENDOSCOPY;  Service: Endoscopy;  Laterality: N/A;  . HOT HEMOSTASIS N/A 01/08/2017  Procedure: HOT HEMOSTASIS (ARGON PLASMA COAGULATION/BICAP);  Surgeon: Irene Shipper, MD;  Location: [facility] ENDOSCOPY;  Service: Endoscopy;  Laterality: N/A;  . IR RADIOLOGIST EVAL & MGMT  12/17/2019  . IR RADIOLOGIST EVAL & MGMT  07/01/2020  . LUMBAR LAMINECTOMY  10/30/2011   Procedure: MICRODISCECTOMY LUMBAR LAMINECTOMY;  Surgeon: Jessy Oto, MD;  Location: [place];  Service: Orthopedics;  Laterality: Right;  Right L4-5 and L5-S1 Microdiscectomy  . LUMBAR LAMINECTOMY  06/17/2012   Procedure: MICRODISCECTOMY LUMBAR LAMINECTOMY;  Surgeon: Jessy Oto, MD;   Location: [place];  Service: Orthopedics;  Laterality: N/A;  Right L5-S1 microdiscectomy  . OPEN PARTIAL HEPATECTOMY  N/A 02/26/2020   Procedure: OPEN PARTIAL HEPATECTOMY, CHOLECYSTECTOMY;  Surgeon: Stark Klein, MD;  Location: [facility];  Service: General;  Laterality: N/A;  . POLYPECTOMY    . PROSTATE SURGERY  march 2012   seed implant  . RADIOFREQUENCY ABLATION N/A 07/21/2020   Procedure: CT MICROWAVE ABLATION;  Surgeon: Criselda Peaches, MD;  Location: [facility];  Service: Anesthesiology;  Laterality: N/A;  . ROBOTIC ASSITED PARTIAL NEPHRECTOMY Right 09/08/2015   Procedure: XI ROBOTIC ASSITED RIGHT PARTIAL NEPHRECTOMY;  Surgeon: [name], MD;  Location: [facility];  Service: Urology;  Laterality: Right;  Clamp on: 1033 Clamp off: 1103 Total Clamp Time: 30 minutes  . SHOULDER ARTHROSCOPY WITH SUBACROMIAL DECOMPRESSION Left 01/20/2016   Procedure: LEFT SHOULDER ARTHROSCOPY WITH EXTENSIVE  DEBRIDEMENT, ACROMIOPLASTY;  Surgeon: Ninetta Lights, MD;  Location: [facility];  Service: Orthopedics;  Laterality: Left;  . SHOULDER SURGERY  LFT  . Stress Cardiolite  12/24/2003   negative for ischemia  . UPPER ESOPHAGEAL ENDOSCOPIC ULTRASOUND (EUS)  04/18/2016   [facility]  . UPPER GASTROINTESTINAL ENDOSCOPY    . 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 Authorizing Provider  acetaminophen (TYLENOL) 500 MG tablet Take 2 tablets (1,000 mg total) by mouth every 6 (six) hours. Patient not taking: No sig reported 03/03/20   [name], MD  amLODipine (NORVASC) 5 MG tablet Take 1 tablet (5 mg total) by mouth daily. 05/23/20   [name], MD  aspirin EC 81 MG tablet Take 81 mg by mouth daily. Swallow whole.    [provider]  atorvastatin (LIPITOR) 40 MG tablet Take 1 tablet (40 mg total) by  mouth daily. 12/20/19   [name], MD  cholecalciferol (VITAMIN D) 1000 units tablet Take 1,000 Units by mouth daily.    [provider]  clotrimazole-betamethasone (LOTRISONE) cream APPLY TO AFFECTED AREA TWICE A DAY Patient not taking: Reported on 07/12/2020 04/14/20   [name], MD  Continuous Blood Gluc Receiver (FREESTYLE LIBRE 14 DAY READER) DEVI 1 Act by Does not apply route daily. 05/23/20   [name], MD  Continuous Blood Gluc Sensor (FREESTYLE LIBRE 2 SENSOR) MISC 1 Act by Does not apply route daily. 05/23/20   [name], MD  diltiazem (TIAZAC) 120 MG 24 hr capsule Take 120 mg by mouth daily. 02/06/20   [provider]  docusate sodium (COLACE) 100 MG capsule Take 1 capsule (100 mg total) by mouth 2 (two) times daily. 03/03/20   [name], MD  ferrous sulfate 325 (65 FE) MG tablet Take 1 tablet (325 mg total) by mouth 2 (two) times daily.  Patient needs office visit for further refills Patient taking differently: Take 325 mg by mouth daily with breakfast. 02/24/19   Irene Shipper, MD  gabapentin (NEURONTIN) 100 MG capsule Take 100 mg by mouth at bedtime.    [provider]  gemfibrozil (LOPID) 600 MG tablet Take 600 mg by mouth 2 (two) times daily before a meal.    [provider]  hydrOXYzine (ATARAX/VISTARIL) 25 MG tablet Take 25 mg by mouth 3 (three) times daily as needed for anxiety, itching or nausea.     [provider]  indapamide (LOZOL) 1.25 MG tablet TAKE 1 TABLET (1.25 MG TOTAL) BY MOUTH DAILY. 08/25/20   [name], MD  insulin glargine (LANTUS SOLOSTAR) 100 UNIT/ML Solostar Pen Inject 30 Units into the skin daily as needed (blood sugar over 200). 05/23/20   [name], MD  Insulin Pen Needle 32G X 6 MM MISC 1 Act by Does not apply route daily. 05/23/20   [name], MD  Omega-3 Fatty Acids (FISH OIL PO) Take 1 tablet by mouth daily.    [provider]  ONE TOUCH ULTRA TEST test strip USE 2  (TWO) TIMES DAILY. AND LANCETS 2/DAY 250.03 11/05/16   [name], MD  OVER THE COUNTER MEDICATION Take 1 tablet by mouth 2 (two) times daily. Focus factor    [provider]  Oxymetazoline HCl (VICKS SINEX NA) Place 1 spray into the nose as needed (congestion).    [provider]  polyvinyl alcohol (LIQUIFILM TEARS) 1.4 % ophthalmic solution Place 1 drop into both eyes as needed for dry eyes.    [provider]  solifenacin (VESICARE) 5 MG tablet Take 1 tablet (5 mg total) by mouth daily. 05/20/20   [name], MD  tamsulosin (FLOMAX) 0.4 MG CAPS capsule TAKE 1 CAPSULE (0.4 MG TOTAL) BY MOUTH DAILY. Patient taking differently: Take 0.4 mg by mouth daily. 01/20/14   [name], MD  traMADol (ULTRAM) 50 MG tablet Take 50-100 mg by mouth every 6 (six) hours as needed for pain. 06/03/20   [provider]     Family History  Problem Relation Age of Onset  . Cancer Sister 30       Cancer, unsure type  . Hypertension Other   . Colon cancer Neg Hx   . Esophageal cancer Neg Hx   . Stomach cancer Neg Hx   . Diabetes Neg Hx   . Colon polyps Neg Hx   . Rectal cancer Neg Hx     Social History   Socioeconomic History  . Marital status: Widowed    Spouse name: Deneise Lever  . Number of children: 3  . Years of education: 12+  . Highest education level: Not on file  Occupational History  . 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 Smoking status: Never Smoker  . Smokeless tobacco: Never Used  Vaping Use  . Vaping Use: Never used  Substance and Sexual Activity  . Alcohol use: Not Currently    Alcohol/week: 0.0 standard drinks  . Drug use: No  . Sexual activity: Not Currently    Partners: Female  Other Topics Concern  . Not on file  Social History Narrative   Married-divorced '89, remarried '96. 2 sons, 1 daughter. 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 He wishes to be a  full resuscitation candidate.       Social Determinants of Health   Financial Resource Strain: Low Risk   . Difficulty of Paying Living Expenses: Not very hard  Food Insecurity: No Food Insecurity  . Worried About Charity fundraiser in the Last Year: Never true  . Ran Out of Food in the Last Year: Never true  Transportation Needs: No Transportation Needs  . Lack of Transportation (Medical): No  . Lack of Transportation (Non-Medical): No  Physical Activity: Not on file  Stress: Not on file  Social Connections: Not on file    ECOG Status: 0 - Asymptomatic  Review of Systems  Review of Systems: A 12 point ROS discussed and pertinent positives are indicated in the HPI above.  All other systems are negative.  Physical Exam No direct physical exam was performed (except for noted visual exam findings with Video Visits).    Vital Signs: There were no vitals taken for this visit.  Imaging: No results found.  Labs:  CBC: Recent Labs    05/20/20 1605 07/12/20 1444 07/21/20 1031 07/22/20 0536  WBC 3.2* 3.3* 4.3 6.0  HGB 9.7* 9.3* 9.0* 9.1*  HCT 27.9* 27.7* 26.4* 27.8*  PLT 221.0 215 215 151    COAGS: Recent Labs    01/05/20 0916 02/16/20 1257 02/26/20 1526 07/21/20 1031  INR 1.0 0.9 1.3* 1.0    BMP: Recent Labs    11/21/19 1849 12/18/19 1425 02/16/20 1257 02/26/20 1224 03/03/20 0452 05/20/20 1605 07/12/20 1444 07/21/20 1031 07/22/20 0536  NA 134* 139 136   < > 136 138 140 140 135  K 4.7 5.1 4.8   < > 4.0 4.5 4.4 4.5 5.4*  CL 105 107 107   < > 104 106 109 111 108  CO2 21* 27 22   < > 20* 26 22 19* 20*  GLUCOSE 107* 93 95   < > 129* 194* 189* 131* 160*  BUN 31* 24 28*   < > 42* 30* 34* 33* 31*  CALCIUM 9.6 10.0 9.7   < > 9.6 9.9 9.6 9.8 8.9  CREATININE 2.17* 2.11* 2.22*   < > 2.96* 2.97* 3.54* 3.49* 3.26*  GFRNONAA 29* 30* 28*   < > 20*  --  17* 18* 19*  GFRAA 34* 35* 33*  --   --   --   --    --   --    < > = values in this interval not displayed.    LIVER FUNCTION TESTS: Recent Labs    02/16/20 1257 05/20/20 1605 07/21/20 1031 07/22/20 0536  BILITOT 0.4 0.3 0.5 0.7  AST 21 13 17  288*  ALT 20 12 15  166*  ALKPHOS 92 121* 98 83  PROT 7.1 6.7 7.0 6.5  ALBUMIN 3.8 4.1 3.6 3.1*    TUMOR MARKERS: No results for input(s): AFPTM, CEA, CA199, CHROMGRNA in the last 8760 hours.  Assessment and Plan:  Doing well and fully recovered 1 month status post percutaneous thermal ablation of right adrenal and right intrahepatic metastatic papillary renal cell carcinoma.  His procedure was complicated by iatrogenic pneumothorax which was successfully treated by percutaneous chest tube placement at the time of ablation.  2 small lesions (1.3 cm nodule along the posterior medial aspect of the liver capsule, and a 2.0 x 1.3 cm nodule along the abdominal surface of the diaphragm) could not be treated safely.  I plan to refer Antonio Adams to our colleagues in radiation oncology to see if  SBRT is a consideration for treatment of these 2 residual foci of disease.  1.)  Referral to radiation oncology, Dr. [name], as well as referral for presentation at the Urology Tumor board at [facility].  2.)  Follow-up gadolinium enhanced MRI and accompanying clinic visit in 3 months.   Electronically Signed: Criselda Peaches 08/25/2020, 2:15 PM   I spent a total of 15 Minutes in remote  clinical consultation, greater than 50% of which was counseling/coordinating care for metastatic papillary renal cell cancer.    Visit type: Audio only (telephone). Audio (no video) only due to patient preference. Alternative for in-person consultation at [facility], [address] [place], [place], [place]. This visit type was conducted due to national recommendations for restrictions regarding the COVID-19 Pandemic (e.g. social distancing).  This format is felt to be most appropriate for this patient at this time.  All  issues noted in this document were discussed and addressed.

## 2020-08-26 ENCOUNTER — Other Ambulatory Visit: Payer: Self-pay | Admitting: Interventional Radiology

## 2020-08-31 ENCOUNTER — Other Ambulatory Visit: Payer: Self-pay

## 2020-08-31 ENCOUNTER — Ambulatory Visit
Admission: RE | Admit: 2020-08-31 | Discharge: 2020-08-31 | Disposition: A | Discharge status: Home / Self Care | Payer: Medicare Other | Source: Ambulatory Visit | Attending: Radiation Oncology | Admitting: Radiation Oncology

## 2020-08-31 ENCOUNTER — Encounter: Payer: Self-pay | Admitting: Radiation Oncology

## 2020-08-31 DIAGNOSIS — C787 Secondary malignant neoplasm of liver and intrahepatic bile duct: Secondary | ICD-10-CM | POA: Insufficient documentation

## 2020-08-31 DIAGNOSIS — C641 Malignant neoplasm of right kidney, except renal pelvis: Secondary | ICD-10-CM | POA: Insufficient documentation

## 2020-08-31 HISTORY — DX: Malignant neoplasm of unspecified kidney, except renal pelvis: C64.9

## 2020-08-31 NOTE — Progress Notes (Signed)
Radiation Oncology         [phone] ________________________________  Initial outpatient Consultation - Conducted via telephone due to current COVID-19 concerns for limiting patient exposure  Name: Antonio Adams MRN: [id number]  Date of Service: 08/31/2020 DOB: [birth year]  HG:[id number], [name], MD  Criselda Peaches, MD   REFERRING PHYSICIAN: Criselda Peaches, MD  DIAGNOSIS: 75 y/o male with metastatic renal cell carcinoma involving the liver.    ICD-10-CM   1. Renal cell carcinoma of right kidney metastatic to other site [facility])  C64.1     HISTORY OF PRESENT ILLNESS: Antonio Adams is a 75 y.o. male seen at the request of Dr. Laurence Ferrari. He is an established urology patient with a history of Gleason 4+3 adenocarcinoma of the prostate, diagnosed by Dr. Karsten Ro in 2011, and treated with neoadjuvant ADT concurrent with a brachytherapy boost procedure on 07/21/2010 followed by 5 weeks of external beam radiotherapy, completed in 09/2010 and has remained in remission since that time. He was subsequently diagnosed with papillary renal cell carcinoma following surgical resection of a 6 cm right renal mass with partial nephrectomy in 08/2015 with negative surgical margins. Since that time, he has continued to be followed by Dr. Louis Meckel with serial CT imaging.  He underwent surveillance CT A/P on 11/06/19 showing a new soft tissue nodule in the right retroperitoneum and an area infiltrating the liver. This was further evaluated with abdominal MRI on 11/27/19 showing three new lesions on the right, concerning for recurrent malignancy from prior renal cell carcinoma with increasing nodularity along bandlike scarring along the posterior periphery of the nephrectomy site, a new enhancing nodule along the posterior renal fascia below the level of the kidney, and a new right adrenal nodule with nonspecific imaging characteristics very closely associated with the adjacent right hepatic lobe capsule.  He proceeded to biopsy of the right adrenal mass on 01/05/20, which confirmed metastatic papillary renal cell carcinoma. He underwent an open partial hepatectomy, cholecystectomy, renal metastasectomy and right adrenalectomy on 02/26/20 under the care of Dr. Louis Meckel and Dr. Barry Dienes. Final surgical pathology confirmed metastatic papillary renal cell carcinoma in the right kidney lesion and the retroperitoneal nodule. The right upper quadrant peritoneal nodule was benign.   Unfortunately, follow up CT on 06/10/20 showed persistent hepatic and adrenal fossa lesions without evidence of distant metastasis.  His case was discussed in the multidisciplinary urologic oncology conference on 06/25/2020 and the consensus recommendation was for percutaneous ablation of the right adrenal fossa and intrahepatic lesions.  He was referred to Dr. Laurence Ferrari with interventional radiology on 07/01/20. An MRI abdomen was performed the following day for surgical planning and showed surgical changes about the right hepatic lobe, right kidney, and right adrenal fossa with residual/recurrent disease within the right adrenal fossa and adjacent right hepatic lobe, progressive as compared to the prior MRI from 11/2019. Subtle soft tissue signal within the apex of the right adrenal fossa was suspicious for stable small volume disease and no distant metastasis was identified. He proceeded to CT guided microwave ablation of the lesions on 07/21/20. Unfortunately, at the time of the procedure, the tiny peritoneal implants along the diaphragm surface in the superior medial aspect of the right hepatic lobe could not be safely targeted.  He has been kindly referred to [place] today for consideration of possible stereotactic radiosurgery to the 1.3 cm lesion in the posterior medial liver capsule and 2 cm lesion along the abdominal surface of the diaphragm.  PREVIOUS RADIATION THERAPY: Yes  2012: Prostate: brachytherapy boost (07/31/10) followed by 5 weeks  external beam radiotherapy to the prostate and pelvic nodes [name])- completed 09/2010.  PAST MEDICAL HISTORY:  Past Medical History:  Diagnosis Date  . ADENOCARCINOMA, PROSTATE, GLEASON GRADE 6 12/21/2009   prostate cancer  . ALLERGIC RHINITIS 03/16/2007  . Allergy   . Anxiety   . Arthritis    back   . BACK PAIN WITH RADICULOPATHY 11/23/2008  . Cancer of kidney ([place])    partial right kidney removed  . CARPAL TUNNEL SYNDROME, BILATERAL 03/16/2007  . Chronic back pain   . Constipation    takes Senokot daily  . Depression    occasionally  . DIABETES MELLITUS, TYPE II 03/16/2007   takes Januvia and MEtformin daily  . GASTROINTESTINAL HEMORRHAGE, HX OF 03/16/2007  . GERD 03/16/2007   takes Omeprazole daily  . Glaucoma    mild - no eye drops  . Hemorrhoids   . History of colon polyps   . HYPERLIPIDEMIA 03/16/2007   takes Crestor daily  . HYPERTENSION 03/16/2007   takes Diltiazem and Lisinopril daily   . Kidney cancer, primary, with metastasis from kidney to other site [facility]   . LEG CRAMPS 05/22/2007  . Nocturia   . Overactive bladder   . PARESTHESIA 05/21/2007  . Proctitis   . PTSD (post-traumatic stress disorder)    wakes up may be dreaming of fighting  . Rectal bleeding    Dr.Norins has explained its from the Radiation that he has received  . Rectal bleeding   . Renal cell carcinoma ([place])   . Sleep apnea    uses CPAP nightly  . Stomach cancer ([place])   . Urinary frequency    takes Toviaz daily      PAST SURGICAL HISTORY: Past Surgical History:  Procedure Laterality Date  . ADRENALECTOMY Right 02/26/2020   Procedure: OPEN RIGHT RENAL CELL METASTATECTOMY AND  ADRENALECTOMY;  Surgeon: [name], MD;  Location: [facility];  Service: Urology;  Laterality: Right;  . [place], 2008   repeat surgery; ESI '08  . CARPAL TUNNEL RELEASE     bilateral  . CERVICAL FUSION    . COLONOSCOPY    . colonosocpy    . ESOPHAGOGASTRODUODENOSCOPY     ED  . FLEXIBLE  SIGMOIDOSCOPY N/A 01/08/2017   Procedure: FLEXIBLE SIGMOIDOSCOPY;  Surgeon: Irene Shipper, MD;  Location: [facility] ENDOSCOPY;  Service: Endoscopy;  Laterality: N/A;  . HOT HEMOSTASIS N/A 01/08/2017   Procedure: HOT HEMOSTASIS (ARGON PLASMA COAGULATION/BICAP);  Surgeon: Irene Shipper, MD;  Location: [facility] ENDOSCOPY;  Service: Endoscopy;  Laterality: N/A;  . IR RADIOLOGIST EVAL & MGMT  12/17/2019  . IR RADIOLOGIST EVAL & MGMT  07/01/2020  . LUMBAR LAMINECTOMY  10/30/2011   Procedure: MICRODISCECTOMY LUMBAR LAMINECTOMY;  Surgeon: Jessy Oto, MD;  Location: [place];  Service: Orthopedics;  Laterality: Right;  Right L4-5 and L5-S1 Microdiscectomy  . LUMBAR LAMINECTOMY  06/17/2012   Procedure: MICRODISCECTOMY LUMBAR LAMINECTOMY;  Surgeon: Jessy Oto, MD;  Location: [place];  Service: Orthopedics;  Laterality: N/A;  Right L5-S1 microdiscectomy  . OPEN PARTIAL HEPATECTOMY  N/A 02/26/2020   Procedure: OPEN PARTIAL HEPATECTOMY, CHOLECYSTECTOMY;  Surgeon: Stark Klein, MD;  Location: [facility];  Service: General;  Laterality: N/A;  . POLYPECTOMY    . PROSTATE SURGERY  march 2012   seed implant  . RADIOFREQUENCY ABLATION N/A 07/21/2020   Procedure: CT MICROWAVE ABLATION;  Surgeon: Criselda Peaches, MD;  Location: [facility];  Service: Anesthesiology;  Laterality: N/A;  . ROBOTIC ASSITED PARTIAL NEPHRECTOMY Right 09/08/2015   Procedure: XI ROBOTIC ASSITED RIGHT PARTIAL NEPHRECTOMY;  Surgeon: [name], MD;  Location: [facility];  Service: Urology;  Laterality: Right;  Clamp on: 1033 Clamp off: 1103 Total Clamp Time: 30 minutes  . SHOULDER ARTHROSCOPY WITH SUBACROMIAL DECOMPRESSION Left 01/20/2016   Procedure: LEFT SHOULDER ARTHROSCOPY WITH EXTENSIVE  DEBRIDEMENT, ACROMIOPLASTY;  Surgeon: Ninetta Lights, MD;  Location: [place];  Service: Orthopedics;  Laterality: Left;  . SHOULDER SURGERY  LFT  . Stress Cardiolite  12/24/2003   negative for ischemia  . UPPER ESOPHAGEAL ENDOSCOPIC ULTRASOUND (EUS)   04/18/2016   [facility] hospital  . UPPER GASTROINTESTINAL ENDOSCOPY    . WOUND EXPLORATION     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    FAMILY HISTORY:  Family History  Problem Relation Age of Onset  . Cancer Sister 30       Cancer, unsure type  . Hypertension Other   . Colon cancer Neg Hx   . Esophageal cancer Neg Hx   . Stomach cancer Neg Hx   . Diabetes Neg Hx   . Colon polyps Neg Hx   . Rectal cancer Neg Hx     SOCIAL HISTORY:  Social History   Socioeconomic History  . Marital status: Widowed    Spouse name: Deneise Lever  . Number of children: 3  . Years of education: 12+  . Highest education level: Not on file  Occupational History  . 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 Smoking status: Never Smoker  . Smokeless tobacco: Never Used  Vaping Use  . Vaping Use: Never used  Substance and Sexual Activity  . Alcohol use: Not Currently    Alcohol/week: 0.0 standard drinks  . Drug use: No  . Sexual activity: Not Currently    Partners: Female  Other Topics Concern  . Not on file  Social History Narrative   Married-divorced '89, remarried '96. 2 sons, 1 daughter. 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 He wishes to be a full resuscitation candidate.       Social Determinants of Health   Financial Resource Strain: Low Risk   . Difficulty of Paying Living Expenses: Not very hard  Food Insecurity: No Food Insecurity  . Worried About Charity fundraiser in the Last Year: Never true  . Ran Out of Food in the Last Year: Never true  Transportation Needs: No Transportation Needs  . Lack of Transportation (Medical): No  . Lack of Transportation (Non-Medical): No  Physical Activity: Not on file  Stress: Not on file  Social Connections: Not on file  Intimate Partner Violence: Not on file    ALLERGIES: Ace inhibitors and  Lisinopril  MEDICATIONS:  Current Outpatient Medications  Medication Sig Dispense Refill  . amLODipine (NORVASC) 5 MG tablet Take 1 tablet (5 mg total) by mouth daily. 90 tablet 1  . aspirin EC 81 MG tablet Take 81 mg by mouth daily. Swallow whole.    Marland Kitchen atorvastatin (LIPITOR) 40 MG tablet Take 1 tablet (40 mg total) by mouth daily. 90 tablet 1  . cholecalciferol (VITAMIN D) 1000 units tablet Take 1,000 Units by mouth daily.    . Continuous Blood Gluc Receiver (FREESTYLE LIBRE 14 DAY READER) DEVI 1 Act by Does not  apply route daily. 2 each 5  . Continuous Blood Gluc Sensor (FREESTYLE LIBRE 2 SENSOR) MISC 1 Act by Does not apply route daily. 2 each 5  . diltiazem (TIAZAC) 120 MG 24 hr capsule Take 120 mg by mouth daily.    Marland Kitchen docusate sodium (COLACE) 100 MG capsule Take 1 capsule (100 mg total) by mouth 2 (two) times daily. 60 capsule 0  . ferrous sulfate 325 (65 FE) MG tablet Take 1 tablet (325 mg total) by mouth 2 (two) times daily. Patient needs office visit for further refills (Patient taking differently: Take 325 mg by mouth daily with breakfast.) 60 tablet 1  . gabapentin (NEURONTIN) 100 MG capsule Take 100 mg by mouth at bedtime.    Marland Kitchen gemfibrozil (LOPID) 600 MG tablet Take 600 mg by mouth 2 (two) times daily before a meal.    . hydrOXYzine (ATARAX/VISTARIL) 25 MG tablet Take 25 mg by mouth 3 (three) times daily as needed for anxiety, itching or nausea.     . indapamide (LOZOL) 1.25 MG tablet TAKE 1 TABLET (1.25 MG TOTAL) BY MOUTH DAILY. 90 tablet 0  . insulin glargine (LANTUS SOLOSTAR) 100 UNIT/ML Solostar Pen Inject 30 Units into the skin daily as needed (blood sugar over 200). 9 mL 1  . Insulin Pen Needle 32G X 6 MM MISC 1 Act by Does not apply route daily. 100 each 1  . lisinopril (ZESTRIL) 40 MG tablet TAKE ONE-HALF TABLET BY MOUTH EVERY DAY FOR BLOOD PRESSURE    . Omega-3 Fatty Acids (FISH OIL PO) Take 1 tablet by mouth daily.    . ONE TOUCH ULTRA TEST test strip USE 2 (TWO) TIMES  DAILY. AND LANCETS 2/DAY 250.03 100 each 11  . OVER THE COUNTER MEDICATION Take 1 tablet by mouth 2 (two) times daily. Focus factor    . Oxymetazoline HCl (VICKS SINEX NA) Place 1 spray into the nose as needed (congestion).    . polyvinyl alcohol (LIQUIFILM TEARS) 1.4 % ophthalmic solution Place 1 drop into both eyes as needed for dry eyes.    Marland Kitchen solifenacin (VESICARE) 5 MG tablet Take 1 tablet (5 mg total) by mouth daily. 90 tablet 1  . tamsulosin (FLOMAX) 0.4 MG CAPS capsule TAKE 1 CAPSULE (0.4 MG TOTAL) BY MOUTH DAILY. (Patient taking differently: Take 0.4 mg by mouth daily.) 30 capsule 1  . traMADol (ULTRAM) 50 MG tablet Take 50-100 mg by mouth every 6 (six) hours as needed for pain.    Marland Kitchen acetaminophen (TYLENOL) 500 MG tablet Take 2 tablets (1,000 mg total) by mouth every 6 (six) hours. (Patient not taking: No sig reported) 60 tablet 0  . clotrimazole-betamethasone (LOTRISONE) cream APPLY TO AFFECTED AREA TWICE A DAY (Patient not taking: Reported on 08/31/2020) 30 g 0   No current facility-administered medications for this encounter.    REVIEW OF SYSTEMS:  On review of systems, the patient reports that he is doing well overall. He denies any chest pain, shortness of breath, cough, fevers, chills, night sweats, unintended weight changes. He denies any bowel or bladder disturbances, and denies abdominal pain, nausea or vomiting. He denies any new musculoskeletal or joint aches or pains. A complete review of systems is obtained and is otherwise negative.    PHYSICAL EXAM:  Wt Readings from Last 3 Encounters:  08/31/20 210 lb (95.3 kg)  07/12/20 207 lb 3.2 oz (94 kg)  05/20/20 211 lb (95.7 kg)   Temp Readings from Last 3 Encounters:  07/22/20 98.2 F (36.8 C)  07/12/20 98.8 F (37.1 C) (Oral)  05/20/20 98.7 F (37.1 C) (Oral)   BP Readings from Last 3 Encounters:  07/22/20 (!) 154/92  07/12/20 (!) 160/89  05/20/20 (!) 152/78   Pulse Readings from Last 3 Encounters:  07/22/20 91   07/12/20 75  05/20/20 75   Pain Assessment Pain Score: 0-No pain/10  Physical exam not performed in light of telephone consult visit format.   KPS = 90  100 - Normal; no complaints; no evidence of disease. 90   - Able to carry on normal activity; minor signs or symptoms of disease. 80   - Normal activity with effort; some signs or symptoms of disease. 73   - Cares for self; unable to carry on normal activity or to do active work. 60   - Requires occasional assistance, but is able to care for most of his personal needs. 50   - Requires considerable assistance and frequent medical care. 55   - Disabled; requires special care and assistance. 49   - Severely disabled; hospital admission is indicated although death not imminent. 55   - Very sick; hospital admission necessary; active supportive treatment necessary. 10   - Moribund; fatal processes progressing rapidly. 0     - Dead  Karnofsky DA, Abelmann [place], Craver LS and Burchenal [facility] [phone]) The use of the nitrogen mustards in the palliative treatment of carcinoma: with particular reference to bronchogenic carcinoma Cancer 1 634-56  LABORATORY DATA:  Lab Results  Component Value Date   WBC 6.0 07/22/2020   HGB 9.1 (L) 07/22/2020   HCT 27.8 (L) 07/22/2020   MCV 91.7 07/22/2020   PLT 151 07/22/2020   Lab Results  Component Value Date   NA 135 07/22/2020   K 5.4 (H) 07/22/2020   CL 108 07/22/2020   CO2 20 (L) 07/22/2020   Lab Results  Component Value Date   ALT 166 (H) 07/22/2020   AST 288 (H) 07/22/2020   ALKPHOS 83 07/22/2020   BILITOT 0.7 07/22/2020     RADIOGRAPHY: No results found.    IMPRESSION/PLAN: This visit was conducted via telephone to spare the patient unnecessary potential exposure in the healthcare setting during the current COVID-19 pandemic. 107. 75 y.o. man with metastatic renal cell carcinoma involving the liver. Today, we talked to the patient about the findings and workup thus far. We discussed the  natural history of metastatic renal cell carcinoma and general treatment, highlighting the role of stereotactic radiotherapy in the management of oligometastases. We discussed the available radiation techniques, and focused on the details and logistics of delivery. Given the proximity of the metastases to the diaphragm, we will need to confirm our ability to safely target the lesions at the time of CT simulation/treatment planning. We reviewed the anticipated acute and late sequelae associated with radiation in this setting. The patient was encouraged to ask questions that were answered to his satisfaction.  At the end of our conversation, the patient is willing to proceed with CT simulation and possible stereotactic radiotherapy (SBRT) to the lesions in the posterior medial liver capsule and along the abdominal surface of the diaphragm if these can be safely targeted. He is tentatively scheduled for CT simulation on 09/09/20 at 10:30 am. Assuming we are able to safely target the metastatic lesions, the plan will be to proceed with 5 fractions of SBRT in the near future.  The patient appears to have a good understanding of his disease and our treatment recommendations which are of curative  intent.  Therefore, we will share our discussion with Dr. Laurence Ferrari and Dr. Louis Meckel and move forward with treatment planning accordingly.   Given current concerns for patient exposure during the COVID-19 pandemic, this encounter was conducted via telephone. The patient was notified in advance and was offered a [place] meeting to allow for face to face communication but unfortunately reported that he did not have the appropriate resources/technology to support such a visit and instead preferred to proceed with telephone consult. The patient has given verbal consent for this type of encounter. The time spent during this encounter was 45 minutes. The attendants for this meeting include Tyler Pita MD, Ashlyn Bruning PA-C,  [place], patient, ZANDER INGHAM. During the encounter, Tyler Pita MD, Ashlyn Bruning PA-C, and scribe, Wilburn Mylar were located at [place].  Patient, DARRY KELNHOFER was located at home.    Nicholos Johns, PA-C    Tyler Pita, MD  [facility] Direct Dial: [phone]  Fax: [phone] [email or web address]  Skype  [email or web address]   This document serves as a record of services personally performed by Tyler Pita, MD and Freeman Caldron, PA-C. It was created on their behalf by Wilburn Mylar, a trained medical scribe. The creation of this record is based on the scribe's personal observations and the provider's statements to them. This document has been checked and approved by the attending provider.

## 2020-08-31 NOTE — Progress Notes (Signed)
GU Location of Tumor / Histology: metastatic papillary renal cell cancer   Markham Dumlao has a history of a 6 cm right papillary renal cell carcinoma resected in April of 2017 (negative margins) but subsequently developed hepatic and adrenal metastatic disease.  FINAL MICROSCOPIC DIAGNOSIS:   A. PERITONEAL NODULE, RIGHT UPPER QUADRANT, EXCISION:  Benign fibroadipose tissue and reactive foreign body giant cell  granuloma  Negative for carcinoma   B. GALLBLADDER, CHOLECYSTECTOMY:  Cholecystitis and cholelithiasis   C. METASTATIC KIDNEY TUMOR, RIGHT, EN BLOC RESECTION:  Metastatic papillary renal cell carcinoma, nuclear grade 3  Reactive granulomas and fibrosis   D. RETROPERITONEAL NODULE, INFERIOR LATERAL, EXCISION:  Metastatic papillary renal cell carcinoma, nuclear grade 3  Reactive granulomas and fibrosis   Past/Anticipated interventions by urology, if any: management of prostate cancer  Past/Anticipated interventions by medical oncology, if any: no  Weight changes, if any: down 10-15 lb since surgery  Bowel/Bladder complaints, if any: Denies dysuria, hematuria, urinary leakage or incontinence. Denies diarrhea or constipation.   Nausea/Vomiting, if any: no  Pain issues, if any:  no  SAFETY ISSUES:  Prior radiation? yes, for prostate cancer under the care of Dr. [name].  Pacemaker/ICD? no  Possible current pregnancy? no, male patient  Is the patient on methotrexate? no  Current Complaints / other details:  75 year old male.

## 2020-09-01 DIAGNOSIS — H90A32 Mixed conductive and sensorineural hearing loss, unilateral, left ear with restricted hearing on the contralateral side: Secondary | ICD-10-CM | POA: Insufficient documentation

## 2020-09-01 DIAGNOSIS — H90A21 Sensorineural hearing loss, unilateral, right ear, with restricted hearing on the contralateral side: Secondary | ICD-10-CM | POA: Diagnosis not present

## 2020-09-01 DIAGNOSIS — H9313 Tinnitus, bilateral: Secondary | ICD-10-CM | POA: Diagnosis not present

## 2020-09-08 NOTE — Progress Notes (Signed)
Has armband been applied?  Yes.    Does patient have an allergy to IV contrast dye?: No.   Has patient ever received premedication for IV contrast dye?: No.   Does patient take metformin?: No.  If patient does take metformin when was the last dose: n/a  Date of lab work: Today. 09/09/20 BUN: 47 CR: 3.93  No IV was started. Dr. [name], Dr. Louis Meckel and Dr. Ronnald Ramp all made aware of abnormal CMP via epic inbasket message.

## 2020-09-09 ENCOUNTER — Telehealth: Payer: Self-pay | Admitting: Radiation Oncology

## 2020-09-09 ENCOUNTER — Ambulatory Visit
Admission: RE | Admit: 2020-09-09 | Discharge: 2020-09-09 | Disposition: A | Discharge status: Home / Self Care | Payer: Medicare Other | Source: Ambulatory Visit | Attending: Radiation Oncology | Admitting: Radiation Oncology

## 2020-09-09 ENCOUNTER — Telehealth: Payer: Self-pay

## 2020-09-09 ENCOUNTER — Other Ambulatory Visit: Payer: Self-pay | Admitting: Internal Medicine

## 2020-09-09 ENCOUNTER — Other Ambulatory Visit: Payer: Self-pay

## 2020-09-09 VITALS — BP 153/91 | HR 71 | Temp 98.1°F | Resp 18 | Ht 71.0 in | Wt 200.8 lb

## 2020-09-09 DIAGNOSIS — N1832 Chronic kidney disease, stage 3b: Secondary | ICD-10-CM

## 2020-09-09 DIAGNOSIS — C787 Secondary malignant neoplasm of liver and intrahepatic bile duct: Secondary | ICD-10-CM | POA: Diagnosis not present

## 2020-09-09 DIAGNOSIS — C7971 Secondary malignant neoplasm of right adrenal gland: Secondary | ICD-10-CM | POA: Insufficient documentation

## 2020-09-09 DIAGNOSIS — C641 Malignant neoplasm of right kidney, except renal pelvis: Secondary | ICD-10-CM

## 2020-09-09 LAB — CMP (CANCER CENTER ONLY)
ALT: 18 U/L (ref 0–44)
AST: 19 U/L (ref 15–41)
Albumin: 3.3 g/dL — ABNORMAL LOW (ref 3.5–5.0)
Alkaline Phosphatase: 148 U/L — ABNORMAL HIGH (ref 38–126)
Anion gap: 9 (ref 5–15)
BUN: 47 mg/dL — ABNORMAL HIGH (ref 8–23)
CO2: 20 mmol/L — ABNORMAL LOW (ref 22–32)
Calcium: 9.7 mg/dL (ref 8.9–10.3)
Chloride: 107 mmol/L (ref 98–111)
Creatinine: 3.93 mg/dL (ref 0.61–1.24)
GFR, Estimated: 15 mL/min — ABNORMAL LOW (ref >60)
Glucose, Bld: 103 mg/dL — ABNORMAL HIGH (ref 70–99)
Potassium: 5.3 mmol/L — ABNORMAL HIGH (ref 3.5–5.1)
Sodium: 136 mmol/L (ref 135–145)
Total Bilirubin: <0.2 mg/dL — ABNORMAL LOW (ref 0.3–1.2)
Total Protein: 7 g/dL (ref 6.5–8.1)

## 2020-09-09 NOTE — Telephone Encounter (Signed)
Phoned Mickel Baas, RN for Dr. Louis Meckel. Reports abnormal CMP. She verbalized understanding and confirmed she would report these findings to Dr. Louis Meckel. Epic inbasket message also sent to the patient's PCP, Dr. Scarlette Calico.

## 2020-09-09 NOTE — Telephone Encounter (Signed)
CRITICAL VALUE STICKER  CRITICAL VALUE: Creatinine = 3.93  RECEIVER (on-site recipient of call): Yetta Glassman, CMA  DATE & TIME NOTIFIED: 09/09/20 at 2:56pm  MESSENGER (representative from lab): Hillary  MD NOTIFIED: Dr. [name]  TIME OF NOTIFICATION: 09/09/20 at 3:00pm  RESPONSE: Notification given directly to Dr. [name] who advised, this is pts baseline.

## 2020-09-10 ENCOUNTER — Telehealth: Payer: Self-pay | Admitting: Internal Medicine

## 2020-09-10 NOTE — Telephone Encounter (Signed)
FYI: You put in a nephrology referral for pt. He is an established pt with Dr. Hollie Salk at [facility].  I called them and pt is due for his f/u appt with Dr. Hollie Salk on 5/24.  I have faxed his labs from yesterday to her office to review, to see if he needs to be seen sooner.  I will close the referral you placed.   Thanks

## 2020-09-11 NOTE — Progress Notes (Signed)
  Radiation Oncology         [phone] ________________________________  Name: Antonio Adams MRN: [id number]  Date: 09/09/2020  DOB: June 14, 1945  STEREOTACTIC BODY RADIOTHERAPY SIMULATION AND TREATMENT PLANNING NOTE    ICD-10-CM   1. Renal cell carcinoma of right kidney metastatic to other site [facility])  C64.1     DIAGNOSIS:  75 y.o. man with metastatic renal cell carcinoma involving the liver.  NARRATIVE:  The patient was brought to the [facility].  Identity was confirmed.  All relevant records and images related to the planned course of therapy were reviewed.  The patient freely provided informed written consent to proceed with treatment after reviewing the details related to the planned course of therapy. The consent form was witnessed and verified by the simulation staff.  Then, the patient was set-up in a stable reproducible  supine position for radiation therapy.  A BodyFix immobilization pillow was fabricated for reproducible positioning.  Surface markings were placed.  The CT images were loaded into the planning software.  The gross target volumes (GTV) and planning target volumes (PTV) were delinieated, and avoidance structures were contoured.  Treatment planning then occurred.  The radiation prescription was entered and confirmed.  A total of two complex treatment devices were fabricated in the form of the BodyFix immobilization pillow and a neck accuform cushion.  I have requested : 3D Simulation  I have requested a DVH of the following structures: targets and all normal structures near the target including liver, lung, skin, spinal cord, stomach and kidney as noted on the radiation plan to maintain doses in adherence with established limits  SPECIAL TREATMENT PROCEDURE:  The planned course of therapy using radiation constitutes a special treatment procedure. Special care is required in the management of this patient for the following reasons. High dose per fraction  requiring special monitoring for increased toxicities of treatment including daily imaging..  The special nature of the planned course of radiotherapy will require increased physician supervision and oversight to ensure patient's safety with optimal treatment outcomes.  PLAN:  The patient will receive 50 Gy in 5 fractions.  ________________________________  Sheral Apley [name], M.D.

## 2020-09-13 ENCOUNTER — Emergency Department (HOSPITAL_COMMUNITY): Payer: Medicare Other

## 2020-09-13 ENCOUNTER — Encounter (HOSPITAL_COMMUNITY): Payer: Self-pay

## 2020-09-13 ENCOUNTER — Emergency Department (HOSPITAL_COMMUNITY)
Admission: EM | Admit: 2020-09-13 | Discharge: 2020-09-13 | Disposition: A | Discharge status: Home / Self Care | Payer: Medicare Other | Attending: Emergency Medicine | Admitting: Emergency Medicine

## 2020-09-13 DIAGNOSIS — E1122 Type 2 diabetes mellitus with diabetic chronic kidney disease: Secondary | ICD-10-CM | POA: Diagnosis not present

## 2020-09-13 DIAGNOSIS — R404 Transient alteration of awareness: Secondary | ICD-10-CM | POA: Diagnosis not present

## 2020-09-13 DIAGNOSIS — Z85028 Personal history of other malignant neoplasm of stomach: Secondary | ICD-10-CM | POA: Diagnosis not present

## 2020-09-13 DIAGNOSIS — N183 Chronic kidney disease, stage 3 unspecified: Secondary | ICD-10-CM | POA: Insufficient documentation

## 2020-09-13 DIAGNOSIS — E1129 Type 2 diabetes mellitus with other diabetic kidney complication: Secondary | ICD-10-CM | POA: Diagnosis not present

## 2020-09-13 DIAGNOSIS — Z8546 Personal history of malignant neoplasm of prostate: Secondary | ICD-10-CM | POA: Insufficient documentation

## 2020-09-13 DIAGNOSIS — R6889 Other general symptoms and signs: Secondary | ICD-10-CM | POA: Diagnosis not present

## 2020-09-13 DIAGNOSIS — E1142 Type 2 diabetes mellitus with diabetic polyneuropathy: Secondary | ICD-10-CM | POA: Diagnosis not present

## 2020-09-13 DIAGNOSIS — R42 Dizziness and giddiness: Secondary | ICD-10-CM | POA: Diagnosis not present

## 2020-09-13 DIAGNOSIS — R519 Headache, unspecified: Secondary | ICD-10-CM | POA: Diagnosis not present

## 2020-09-13 DIAGNOSIS — Z85528 Personal history of other malignant neoplasm of kidney: Secondary | ICD-10-CM | POA: Diagnosis not present

## 2020-09-13 DIAGNOSIS — R110 Nausea: Secondary | ICD-10-CM | POA: Insufficient documentation

## 2020-09-13 DIAGNOSIS — Z794 Long term (current) use of insulin: Secondary | ICD-10-CM | POA: Insufficient documentation

## 2020-09-13 DIAGNOSIS — Z7982 Long term (current) use of aspirin: Secondary | ICD-10-CM | POA: Insufficient documentation

## 2020-09-13 DIAGNOSIS — Z743 Need for continuous supervision: Secondary | ICD-10-CM | POA: Diagnosis not present

## 2020-09-13 DIAGNOSIS — I129 Hypertensive chronic kidney disease with stage 1 through stage 4 chronic kidney disease, or unspecified chronic kidney disease: Secondary | ICD-10-CM | POA: Diagnosis not present

## 2020-09-13 DIAGNOSIS — Z79899 Other long term (current) drug therapy: Secondary | ICD-10-CM | POA: Diagnosis not present

## 2020-09-13 DIAGNOSIS — G319 Degenerative disease of nervous system, unspecified: Secondary | ICD-10-CM | POA: Diagnosis not present

## 2020-09-13 LAB — COMPREHENSIVE METABOLIC PANEL
ALT: 20 U/L (ref 0–44)
AST: 18 U/L (ref 15–41)
Albumin: 3.5 g/dL (ref 3.5–5.0)
Alkaline Phosphatase: 123 U/L (ref 38–126)
Anion gap: 7 (ref 5–15)
BUN: 45 mg/dL — ABNORMAL HIGH (ref 8–23)
CO2: 19 mmol/L — ABNORMAL LOW (ref 22–32)
Calcium: 10 mg/dL (ref 8.9–10.3)
Chloride: 106 mmol/L (ref 98–111)
Creatinine, Ser: 4.29 mg/dL — ABNORMAL HIGH (ref 0.61–1.24)
GFR, Estimated: 14 mL/min — ABNORMAL LOW (ref >60)
Glucose, Bld: 98 mg/dL (ref 70–99)
Potassium: 5.4 mmol/L — ABNORMAL HIGH (ref 3.5–5.1)
Sodium: 132 mmol/L — ABNORMAL LOW (ref 135–145)
Total Bilirubin: 0.4 mg/dL (ref 0.3–1.2)
Total Protein: 7.6 g/dL (ref 6.5–8.1)

## 2020-09-13 LAB — CBC WITH DIFFERENTIAL/PLATELET
Abs Immature Granulocytes: 0.01 10*3/uL (ref 0.00–0.07)
Basophils Absolute: 0 10*3/uL (ref 0.0–0.1)
Basophils Relative: 1 %
Eosinophils Absolute: 0 10*3/uL (ref 0.0–0.5)
Eosinophils Relative: 1 %
HCT: 27.2 % — ABNORMAL LOW (ref 39.0–52.0)
Hemoglobin: 8.9 g/dL — ABNORMAL LOW (ref 13.0–17.0)
Immature Granulocytes: 0 %
Lymphocytes Relative: 13 %
Lymphs Abs: 0.4 10*3/uL — ABNORMAL LOW (ref 0.7–4.0)
MCH: 28.9 pg (ref 26.0–34.0)
MCHC: 32.7 g/dL (ref 30.0–36.0)
MCV: 88.3 fL (ref 80.0–100.0)
Monocytes Absolute: 0.6 10*3/uL (ref 0.1–1.0)
Monocytes Relative: 19 %
Neutro Abs: 2.3 10*3/uL (ref 1.7–7.7)
Neutrophils Relative %: 66 %
Platelets: 189 10*3/uL (ref 150–400)
RBC: 3.08 MIL/uL — ABNORMAL LOW (ref 4.22–5.81)
RDW: 14.4 % (ref 11.5–15.5)
WBC: 3.4 10*3/uL — ABNORMAL LOW (ref 4.0–10.5)
nRBC: 0 % (ref 0.0–0.2)

## 2020-09-13 MED ORDER — LORAZEPAM 2 MG/ML IJ SOLN
0.5000 mg | Freq: Once | INTRAMUSCULAR | Status: AC
  Administered 2020-09-13: 0.5 mg via INTRAVENOUS
  Filled 2020-09-13: qty 1

## 2020-09-13 MED ORDER — ONDANSETRON 4 MG PO TBDP: ORAL_TABLET | ORAL | 0 refills | Status: DC

## 2020-09-13 MED ORDER — MECLIZINE HCL 25 MG PO TABS: 25.0000 mg | ORAL_TABLET | Freq: Three times a day (TID) | ORAL | 0 refills | Status: DC | PRN

## 2020-09-13 MED ORDER — ONDANSETRON HCL 4 MG/2ML IJ SOLN
4.0000 mg | Freq: Once | INTRAMUSCULAR | Status: AC
  Administered 2020-09-13: 4 mg via INTRAVENOUS
  Filled 2020-09-13: qty 2

## 2020-09-13 MED ORDER — MECLIZINE HCL 25 MG PO TABS
25.0000 mg | ORAL_TABLET | Freq: Once | ORAL | Status: AC
  Administered 2020-09-13: 25 mg via ORAL
  Filled 2020-09-13: qty 1

## 2020-09-13 MED ORDER — METHYLPREDNISOLONE SODIUM SUCC 125 MG IJ SOLR
125.0000 mg | Freq: Once | INTRAMUSCULAR | Status: AC
  Administered 2020-09-13: 125 mg via INTRAVENOUS
  Filled 2020-09-13: qty 2

## 2020-09-13 MED ORDER — SODIUM CHLORIDE 0.9 % IV BOLUS
1000.0000 mL | Freq: Once | INTRAVENOUS | Status: AC
  Administered 2020-09-13: 1000 mL via INTRAVENOUS

## 2020-09-13 NOTE — ED Provider Notes (Signed)
Patient had a little dizziness when he initially was being discharged by the nurse.  We gave him some Ativan and got an MRI of his head which was negative.  Patient felt better and will be discharged home with meclizine again   Milton Ferguson, MD 09/13/20 1219

## 2020-09-13 NOTE — ED Notes (Signed)
Patient sat up for 2 minutes, stood up and ambulated around the room without assistance. Once patient sat back down in the bed he stated he became dizzy again. Sister called for transport home, will provide a wheelchair to the car.

## 2020-09-13 NOTE — ED Triage Notes (Signed)
Pt arrived via EMS, from home, sudden onset dizziness that happened yesterday afternoon, worsening since. Vomiting this morning.

## 2020-09-13 NOTE — Discharge Instructions (Signed)
Follow-up with your doctor this week if you are having any problems.  He can take the Antivert for dizziness and Zofran for nausea

## 2020-09-13 NOTE — ED Provider Notes (Signed)
[facility] DEPT Provider Note   CSN: [id number] Arrival date & time: 09/13/20  9024     History Chief Complaint  Patient presents with  . Dizziness    Antonio Adams is a 75 y.o. male.  Patient complains of having some vertigo symptoms yesterday and today.  It is worse today.  Some nausea  The history is provided by the patient and medical records. No language interpreter was used.  Dizziness Quality:  Head spinning Severity:  Mild Onset quality:  Sudden Timing:  Intermittent Progression:  Waxing and waning Chronicity:  New Context: bending over   Relieved by:  Nothing Associated symptoms: no chest pain, no diarrhea and no headaches        Past Medical History:  Diagnosis Date  . ADENOCARCINOMA, PROSTATE, GLEASON GRADE 6 12/21/2009   prostate cancer  . ALLERGIC RHINITIS 03/16/2007  . Allergy   . Anxiety   . Arthritis    back   . BACK PAIN WITH RADICULOPATHY 11/23/2008  . Cancer of kidney ([place])    partial right kidney removed  . CARPAL TUNNEL SYNDROME, BILATERAL 03/16/2007  . Chronic back pain   . Constipation    takes Senokot daily  . Depression    occasionally  . DIABETES MELLITUS, TYPE II 03/16/2007   takes Januvia and MEtformin daily  . GASTROINTESTINAL HEMORRHAGE, HX OF 03/16/2007  . GERD 03/16/2007   takes Omeprazole daily  . Glaucoma    mild - no eye drops  . Hemorrhoids   . History of colon polyps   . HYPERLIPIDEMIA 03/16/2007   takes Crestor daily  . HYPERTENSION 03/16/2007   takes Diltiazem and Lisinopril daily   . Kidney cancer, primary, with metastasis from kidney to other site [facility])   . LEG CRAMPS 05/22/2007  . Nocturia   . Overactive bladder   . PARESTHESIA 05/21/2007  . Proctitis   . PTSD (post-traumatic stress disorder)    wakes up may be dreaming of fighting  . Rectal bleeding    Dr.Norins has explained its from the Radiation that he has received  . Rectal bleeding   . Renal cell carcinoma ([place])   . Sleep  apnea    uses CPAP nightly  . Stomach cancer ([place])   . Urinary frequency    takes Toviaz daily    Patient Active Problem List   Diagnosis Date Noted  . Encounter for general adult medical examination with abnormal findings 05/20/2020  . Tinnitus aurium, bilateral 05/20/2020  . Metastatic renal cell carcinoma ([place]) 02/26/2020  . Diabetic polyneuropathy associated with type 2 diabetes mellitus ([place]) 12/18/2019  . CKD (chronic kidney disease) stage 3, GFR 30-59 ml/min (HCC) 10/08/2018  . Murmur, cardiac 10/08/2018  . DDD (degenerative disc disease), cervical 10/08/2018  . OAB (overactive bladder) 04/04/2018  . Seasonal allergic rhinitis due to pollen 11/01/2016  . Microalbuminuria due to type 2 diabetes mellitus ([place]) 07/27/2016  . Renal cell carcinoma ([place]) 10/06/2015  . Varicose veins of both lower extremities 08/06/2015  . Varicose veins of lower extremities with ulcer ([place]) 06/16/2015  . Internal hemorrhoid, bleeding 08/03/2014  . Post traumatic stress disorder (PTSD) 01/29/2014  . Deficiency anemia 12/10/2013  . Radiation proctitis 06/04/2012  . Spinal stenosis of lumbar region with radiculopathy 10/30/2011    Class: Chronic  . OSA (obstructive sleep apnea) 11/24/2010  . Obesity, Class I, BMI 30-34.9 11/24/2010  . Routine general medical examination at a health care facility 11/24/2010  . ADENOCARCINOMA, PROSTATE, GLEASON GRADE 6  12/21/2009  . Type II diabetes mellitus with manifestations ([place]) 03/16/2007  . Hyperlipidemia with target LDL less than 100 03/16/2007  . Essential hypertension 03/16/2007  . GERD 03/16/2007    Past Surgical History:  Procedure Laterality Date  . ADRENALECTOMY Right 02/26/2020   Procedure: OPEN RIGHT RENAL CELL METASTATECTOMY AND  ADRENALECTOMY;  Surgeon: [name], MD;  Location: [facility];  Service: Urology;  Laterality: Right;  . [place], 2008   repeat surgery; ESI '08  . CARPAL TUNNEL RELEASE     bilateral  . CERVICAL  FUSION    . COLONOSCOPY    . colonosocpy    . ESOPHAGOGASTRODUODENOSCOPY     ED  . FLEXIBLE SIGMOIDOSCOPY N/A 01/08/2017   Procedure: FLEXIBLE SIGMOIDOSCOPY;  Surgeon: Irene Shipper, MD;  [facility];  Service: Endoscopy;  Laterality: N/A;  . HOT HEMOSTASIS N/A 01/08/2017   Procedure: HOT HEMOSTASIS (ARGON PLASMA COAGULATION/BICAP);  Surgeon: Irene Shipper, MD;  Location: [facility] ENDOSCOPY;  Service: Endoscopy;  Laterality: N/A;  . IR RADIOLOGIST EVAL & MGMT  12/17/2019  . IR RADIOLOGIST EVAL & MGMT  07/01/2020  . IR RADIOLOGIST EVAL & MGMT  08/25/2020  . LUMBAR LAMINECTOMY  10/30/2011   Procedure: MICRODISCECTOMY LUMBAR LAMINECTOMY;  Surgeon: Jessy Oto, MD;  Location: [facility];  Service: Orthopedics;  Laterality: Right;  Right L4-5 and L5-S1 Microdiscectomy  . LUMBAR LAMINECTOMY  06/17/2012   Procedure: MICRODISCECTOMY LUMBAR LAMINECTOMY;  Surgeon: Jessy Oto, MD;  Location: [facility];  Service: Orthopedics;  Laterality: N/A;  Right L5-S1 microdiscectomy  . OPEN PARTIAL HEPATECTOMY  N/A 02/26/2020   Procedure: OPEN PARTIAL HEPATECTOMY, CHOLECYSTECTOMY;  Surgeon: Stark Klein, MD;  Location: [facility];  Service: General;  Laterality: N/A;  . POLYPECTOMY    . PROSTATE SURGERY  march 2012   seed implant  . RADIOFREQUENCY ABLATION N/A 07/21/2020   Procedure: CT MICROWAVE ABLATION;  Surgeon: Criselda Peaches, MD;  Location: [facility];  Service: Anesthesiology;  Laterality: N/A;  . ROBOTIC ASSITED PARTIAL NEPHRECTOMY Right 09/08/2015   Procedure: XI ROBOTIC ASSITED RIGHT PARTIAL NEPHRECTOMY;  Surgeon: [name], MD;  Location: [facility];  Service: Urology;  Laterality: Right;  Clamp on: 1033 Clamp off: 1103 Total Clamp Time: 30 minutes  . SHOULDER ARTHROSCOPY WITH SUBACROMIAL DECOMPRESSION Left 01/20/2016   Procedure: LEFT SHOULDER ARTHROSCOPY WITH EXTENSIVE  DEBRIDEMENT, ACROMIOPLASTY;  Surgeon: Ninetta Lights, MD;  Location: [facility];  Service: Orthopedics;  Laterality: Left;   . SHOULDER SURGERY  LFT  . Stress Cardiolite  12/24/2003   negative for ischemia  . UPPER ESOPHAGEAL ENDOSCOPIC ULTRASOUND (EUS)  04/18/2016   [facility] hospital  . UPPER GASTROINTESTINAL ENDOSCOPY    . 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 Cancer Sister 30       Cancer, unsure type  . Hypertension Other   . Colon cancer Neg Hx   . Esophageal cancer Neg Hx   . Stomach cancer Neg Hx   . Diabetes Neg Hx   . Colon polyps Neg Hx   . Rectal cancer Neg Hx     Social History   Tobacco Use  . Smoking status: Never Smoker  . Smokeless tobacco: Never Used  Vaping Use  . Vaping Use: Never used  Substance Use Topics  . Alcohol use: Not Currently  Alcohol/week: 0.0 standard drinks  . Drug use: No    Home Medications Prior to Admission medications   Medication Sig Start Date End Date Taking? Authorizing Provider  meclizine (ANTIVERT) 25 MG tablet Take 1 tablet (25 mg total) by mouth 3 (three) times daily as needed for dizziness. 09/13/20  Yes Milton Ferguson, MD  ondansetron (ZOFRAN ODT) 4 MG disintegrating tablet 4mg  ODT q4 hours prn nausea/vomit 09/13/20  Yes Milton Ferguson, MD  acetaminophen (TYLENOL) 500 MG tablet Take 2 tablets (1,000 mg total) by mouth every 6 (six) hours. Patient not taking: No sig reported 03/03/20   [name], MD  amLODipine (NORVASC) 5 MG tablet Take 1 tablet (5 mg total) by mouth daily. 05/23/20   [name], MD  aspirin EC 81 MG tablet Take 81 mg by mouth daily. Swallow whole.    [provider]  atorvastatin (LIPITOR) 40 MG tablet Take 1 tablet (40 mg total) by mouth daily. 12/20/19   [name], MD  cholecalciferol (VITAMIN D) 1000 units tablet Take 1,000 Units by mouth daily.    [provider]  Continuous Blood Gluc Receiver (FREESTYLE LIBRE 14 DAY READER) DEVI 1 Act by Does not apply route daily. 05/23/20   [name], MD   Continuous Blood Gluc Sensor (FREESTYLE LIBRE 2 SENSOR) MISC 1 Act by Does not apply route daily. 05/23/20   [name], MD  diltiazem (TIAZAC) 120 MG 24 hr capsule Take 120 mg by mouth daily. 02/06/20   [provider]  docusate sodium (COLACE) 100 MG capsule Take 1 capsule (100 mg total) by mouth 2 (two) times daily. 03/03/20   [name], MD  ferrous sulfate 325 (65 FE) MG tablet Take 1 tablet (325 mg total) by mouth 2 (two) times daily. Patient needs office visit for further refills Patient taking differently: Take 325 mg by mouth daily with breakfast. 02/24/19   Irene Shipper, MD  gabapentin (NEURONTIN) 100 MG capsule Take 100 mg by mouth at bedtime.    [provider]  gemfibrozil (LOPID) 600 MG tablet Take 600 mg by mouth 2 (two) times daily before a meal.    [provider]  hydrOXYzine (ATARAX/VISTARIL) 25 MG tablet Take 25 mg by mouth 3 (three) times daily as needed for anxiety, itching or nausea.     [provider]  indapamide (LOZOL) 1.25 MG tablet TAKE 1 TABLET (1.25 MG TOTAL) BY MOUTH DAILY. 08/25/20   [name], MD  insulin glargine (LANTUS SOLOSTAR) 100 UNIT/ML Solostar Pen Inject 30 Units into the skin daily as needed (blood sugar over 200). 05/23/20   [name], MD  Insulin Pen Needle 32G X 6 MM MISC 1 Act by Does not apply route daily. 05/23/20   [name], MD  lisinopril (ZESTRIL) 40 MG tablet TAKE ONE-HALF TABLET BY MOUTH EVERY DAY FOR BLOOD PRESSURE 10/15/19   [provider]  Omega-3 Fatty Acids (FISH OIL PO) Take 1 tablet by mouth daily.    [provider]  ONE TOUCH ULTRA TEST test strip USE 2 (TWO) TIMES DAILY. AND LANCETS 2/DAY 250.03 11/05/16   [name], MD  OVER THE COUNTER MEDICATION Take 1 tablet by mouth 2 (two) times daily. Focus factor    [provider]  Oxymetazoline HCl (VICKS SINEX NA) Place 1 spray into the nose as needed (congestion).    [provider]   polyvinyl alcohol (LIQUIFILM TEARS) 1.4 % ophthalmic solution Place 1  drop into both eyes as needed for dry eyes.    [provider]  solifenacin (VESICARE) 5 MG tablet Take 1 tablet (5 mg total) by mouth daily. 05/20/20   [name], MD  tamsulosin (FLOMAX) 0.4 MG CAPS capsule TAKE 1 CAPSULE (0.4 MG TOTAL) BY MOUTH DAILY. Patient taking differently: Take 0.4 mg by mouth daily. 01/20/14   [name], MD  traMADol (ULTRAM) 50 MG tablet Take 50-100 mg by mouth every 6 (six) hours as needed for pain. 06/03/20   [provider]    Allergies    Ace inhibitors and Lisinopril  Review of Systems   Review of Systems  Constitutional: Negative for appetite change and fatigue.  HENT: Negative for congestion, ear discharge and sinus pressure.   Eyes: Negative for discharge.  Respiratory: Negative for cough.   Cardiovascular: Negative for chest pain.  Gastrointestinal: Negative for abdominal pain and diarrhea.  Genitourinary: Negative for frequency and hematuria.  Musculoskeletal: Negative for back pain.  Skin: Negative for rash.  Neurological: Positive for dizziness. Negative for seizures and headaches.  Psychiatric/Behavioral: Negative for hallucinations.    Physical Exam Updated Vital Signs BP (!) 173/97   Pulse 77   Temp 98.3 F (36.8 C) (Oral)   Resp 17   SpO2 100%   Physical Exam Vitals and nursing note reviewed.  Constitutional:      Appearance: He is well-developed.  HENT:     Head: Normocephalic.     Nose: Nose normal.  Eyes:     General: No scleral icterus.    Conjunctiva/sclera: Conjunctivae normal.  Neck:     Thyroid: No thyromegaly.  Cardiovascular:     Rate and Rhythm: Normal rate and regular rhythm.     Heart sounds: No murmur heard. No friction rub. No gallop.   Pulmonary:     Breath sounds: No stridor. No wheezing or rales.  Chest:     Chest wall: No tenderness.  Abdominal:     General: There is no distension.     Tenderness: There  is no abdominal tenderness. There is no rebound.  Musculoskeletal:        General: Normal range of motion.     Cervical back: Neck supple.  Lymphadenopathy:     Cervical: No cervical adenopathy.  Skin:    Findings: No erythema or rash.  Neurological:     Mental Status: He is alert and oriented to person, place, and time.     Motor: No abnormal muscle tone.     Coordination: Coordination normal.  Psychiatric:        Behavior: Behavior normal.     ED Results / Procedures / Treatments   Labs (all labs ordered are listed, but only abnormal results are displayed) Labs Reviewed  CBC WITH DIFFERENTIAL/PLATELET - Abnormal; Notable for the following components:      Result Value   WBC 3.4 (*)    RBC 3.08 (*)    Hemoglobin 8.9 (*)    HCT 27.2 (*)    Lymphs Abs 0.4 (*)    All other components within normal limits  COMPREHENSIVE METABOLIC PANEL - Abnormal; Notable for the following components:   Sodium 132 (*)    Potassium 5.4 (*)    CO2 19 (*)    BUN 45 (*)    Creatinine, Ser 4.29 (*)    GFR, Estimated 14 (*)    All other components within normal limits    EKG None  Radiology CT Head Wo Contrast  Result Date: 09/13/2020 CLINICAL DATA:  Headaches, history of renal cell carcinoma EXAM: CT HEAD WITHOUT CONTRAST TECHNIQUE: Contiguous axial images were obtained from the base of the skull through the vertex without intravenous contrast. COMPARISON:  MRI from 12/06/2014 FINDINGS: Brain: No evidence of acute infarction, hemorrhage, hydrocephalus, extra-axial collection or mass lesion/mass effect. Partially empty sella is noted and stable Vascular: No hyperdense vessel or unexpected calcification. Skull: Normal. Negative for fracture or focal lesion. Sinuses/Orbits: No acute finding. Other: None. IMPRESSION: No acute intracranial abnormality noted. Electronically Signed   By: Inez Catalina M.D.   On: 09/13/2020 09:13    Procedures Procedures   Medications Ordered in ED Medications   sodium chloride 0.9 % bolus 1,000 mL (1,000 mLs Intravenous New Bag/Given 09/13/20 0908)  ondansetron (ZOFRAN) injection 4 mg (4 mg Intravenous Given 09/13/20 0908)  meclizine (ANTIVERT) tablet 25 mg (25 mg Oral Given 09/13/20 1660)    ED Course  I have reviewed the triage vital signs and the nursing notes.  Pertinent labs & imaging results that were available during my care of the patient were reviewed by me and considered in my medical decision making (see chart for details).    MDM Rules/Calculators/A&P                          Patient with vertigo symptoms.  Labs and CT are unremarkable.  Patient improved with treatment.  He will follow-up with his doctor as needed and is given meclizine and Zofran Final Clinical Impression(s) / ED Diagnoses Final diagnoses:  Vertigo    Rx / DC Orders ED Discharge Orders         Ordered    meclizine (ANTIVERT) 25 MG tablet  3 times daily PRN        09/13/20 1014    ondansetron (ZOFRAN ODT) 4 MG disintegrating tablet        09/13/20 1014           Milton Ferguson, MD 09/13/20 1016

## 2020-09-13 NOTE — ED Notes (Signed)
Pt transported to CT via stretcher.  

## 2020-09-13 NOTE — ED Notes (Addendum)
When attempted to discharge patient he sat on the edge of the bed for 3 minutes, got very dizzy, stated that the 'room was spinning' and he fell back onto the bed. Stated he got very dizzy earlier when urinating earlier and is worried about going home.

## 2020-09-15 DIAGNOSIS — C649 Malignant neoplasm of unspecified kidney, except renal pelvis: Secondary | ICD-10-CM | POA: Diagnosis not present

## 2020-09-15 DIAGNOSIS — D509 Iron deficiency anemia, unspecified: Secondary | ICD-10-CM | POA: Diagnosis not present

## 2020-09-15 DIAGNOSIS — E1122 Type 2 diabetes mellitus with diabetic chronic kidney disease: Secondary | ICD-10-CM | POA: Diagnosis not present

## 2020-09-15 DIAGNOSIS — D631 Anemia in chronic kidney disease: Secondary | ICD-10-CM | POA: Diagnosis not present

## 2020-09-15 DIAGNOSIS — N2581 Secondary hyperparathyroidism of renal origin: Secondary | ICD-10-CM | POA: Diagnosis not present

## 2020-09-15 DIAGNOSIS — N184 Chronic kidney disease, stage 4 (severe): Secondary | ICD-10-CM | POA: Diagnosis not present

## 2020-09-16 ENCOUNTER — Ambulatory Visit (INDEPENDENT_AMBULATORY_CARE_PROVIDER_SITE_OTHER): Payer: Medicare Other | Admitting: *Deleted

## 2020-09-16 DIAGNOSIS — I10 Essential (primary) hypertension: Secondary | ICD-10-CM

## 2020-09-16 DIAGNOSIS — E118 Type 2 diabetes mellitus with unspecified complications: Secondary | ICD-10-CM

## 2020-09-16 NOTE — Chronic Care Management (AMB) (Signed)
Chronic Care Management   CCM RN Visit Note  09/16/2020 Name: Antonio Adams MRN: [id number] DOB: 1945/12/23  Subjective: Antonio Adams is a 75 y.o. year old male who is a primary care patient of [name], MD. The care management team was consulted for assistance with disease management and care coordination needs.    Engaged with patient by telephone for follow up visit in response to provider referral for case management and/or care coordination services.   Consent to Services:  The patient was given information about Chronic Care Management services, agreed to services, and gave verbal consent prior to initiation of services.  Please see initial visit note for detailed documentation.  Patient agreed to services and verbal consent obtained.   Assessment: Review of patient past medical history, allergies, medications, health status, including review of consultants reports, laboratory and other test data, was performed as part of comprehensive evaluation and provision of chronic care management services.   CCM Care Plan  Allergies  Allergen Reactions  . Ace Inhibitors Swelling  . Lisinopril Swelling    Outpatient Encounter Medications as of 09/16/2020  Medication Sig  . acetaminophen (TYLENOL) 500 MG tablet Take 2 tablets (1,000 mg total) by mouth every 6 (six) hours. (Patient not taking: No sig reported)  . amLODipine (NORVASC) 5 MG tablet Take 1 tablet (5 mg total) by mouth daily.  Marland Kitchen aspirin EC 81 MG tablet Take 81 mg by mouth daily. Swallow whole.  Marland Kitchen atorvastatin (LIPITOR) 40 MG tablet Take 1 tablet (40 mg total) by mouth daily.  . cholecalciferol (VITAMIN D) 1000 units tablet Take 1,000 Units by mouth daily.  . Continuous Blood Gluc Receiver (FREESTYLE LIBRE 14 DAY READER) DEVI 1 Act by Does not apply route daily.  . Continuous Blood Gluc Sensor (FREESTYLE LIBRE 2 SENSOR) MISC 1 Act by Does not apply route daily.  Marland Kitchen diltiazem (TIAZAC) 120 MG 24 hr capsule Take  120 mg by mouth daily.  Marland Kitchen docusate sodium (COLACE) 100 MG capsule Take 1 capsule (100 mg total) by mouth 2 (two) times daily.  . ferrous sulfate 325 (65 FE) MG tablet Take 1 tablet (325 mg total) by mouth 2 (two) times daily. Patient needs office visit for further refills (Patient taking differently: Take 325 mg by mouth daily with breakfast.)  . gabapentin (NEURONTIN) 100 MG capsule Take 100 mg by mouth at bedtime.  Marland Kitchen gemfibrozil (LOPID) 600 MG tablet Take 600 mg by mouth 2 (two) times daily before a meal.  . hydrOXYzine (ATARAX/VISTARIL) 25 MG tablet Take 25 mg by mouth 3 (three) times daily as needed for anxiety, itching or nausea.   . indapamide (LOZOL) 1.25 MG tablet TAKE 1 TABLET (1.25 MG TOTAL) BY MOUTH DAILY.  Marland Kitchen insulin glargine (LANTUS SOLOSTAR) 100 UNIT/ML Solostar Pen Inject 30 Units into the skin daily as needed (blood sugar over 200).  . Insulin Pen Needle 32G X 6 MM MISC 1 Act by Does not apply route daily.  Marland Kitchen lisinopril (ZESTRIL) 40 MG tablet TAKE ONE-HALF TABLET BY MOUTH EVERY DAY FOR BLOOD PRESSURE  . meclizine (ANTIVERT) 25 MG tablet Take 1 tablet (25 mg total) by mouth 3 (three) times daily as needed for dizziness.  . Omega-3 Fatty Acids (FISH OIL PO) Take 1 tablet by mouth daily.  . ondansetron (ZOFRAN ODT) 4 MG disintegrating tablet 36m ODT q4 hours prn nausea/vomit  . ONE TOUCH ULTRA TEST test strip USE 2 (TWO) TIMES DAILY. AND LANCETS 2/DAY 250.03  . OVER THE COUNTER  MEDICATION Take 1 tablet by mouth 2 (two) times daily. Focus factor  . Oxymetazoline HCl (VICKS SINEX NA) Place 1 spray into the nose as needed (congestion).  . polyvinyl alcohol (LIQUIFILM TEARS) 1.4 % ophthalmic solution Place 1 drop into both eyes as needed for dry eyes.  Marland Kitchen solifenacin (VESICARE) 5 MG tablet Take 1 tablet (5 mg total) by mouth daily.  . tamsulosin (FLOMAX) 0.4 MG CAPS capsule TAKE 1 CAPSULE (0.4 MG TOTAL) BY MOUTH DAILY. (Patient taking differently: Take 0.4 mg by mouth daily.)  . traMADol  (ULTRAM) 50 MG tablet Take 50-100 mg by mouth every 6 (six) hours as needed for pain.   No facility-administered encounter medications on file as of 09/16/2020.    Patient Active Problem List   Diagnosis Date Noted  . Encounter for general adult medical examination with abnormal findings 05/20/2020  . Tinnitus aurium, bilateral 05/20/2020  . Metastatic renal cell carcinoma ([place]) 02/26/2020  . Diabetic polyneuropathy associated with type 2 diabetes mellitus ([place]) 12/18/2019  . CKD (chronic kidney disease) stage 3, GFR 30-59 ml/min ([facility]) 10/08/2018  . Murmur, cardiac 10/08/2018  . DDD (degenerative disc disease), cervical 10/08/2018  . OAB (overactive bladder) 04/04/2018  . Seasonal allergic rhinitis due to pollen 11/01/2016  . Microalbuminuria due to type 2 diabetes mellitus ([place]) 07/27/2016  . Renal cell carcinoma ([place]) 10/06/2015  . Varicose veins of both lower extremities 08/06/2015  . Varicose veins of lower extremities with ulcer ([place]) 06/16/2015  . Internal hemorrhoid, bleeding 08/03/2014  . Post traumatic stress disorder (PTSD) 01/29/2014  . Deficiency anemia 12/10/2013  . Radiation proctitis 06/04/2012  . Spinal stenosis of lumbar region with radiculopathy 10/30/2011    Class: Chronic  . OSA (obstructive sleep apnea) 11/24/2010  . Obesity, Class I, BMI 30-34.9 11/24/2010  . Routine general medical examination at a health care facility 11/24/2010  . ADENOCARCINOMA, PROSTATE, GLEASON GRADE 6 12/21/2009  . Type II diabetes mellitus with manifestations ([place]) 03/16/2007  . Hyperlipidemia with target LDL less than 100 03/16/2007  . Essential hypertension 03/16/2007  . GERD 03/16/2007    Conditions to be addressed/monitored:HTN and DMII  Care Plan : Diabetes Type 2 (Adult)  Updates made by Knox Royalty, RN since 09/16/2020 12:00 AM    Problem: Glycemic Management (Diabetes, Type 2)   Priority: Medium    Long-Range Goal: Glycemic Management Optimized   Start Date: 08/02/2020   Expected End Date: 02/02/2021  This Visit's Progress: On track  Recent Progress: On track  Priority: Medium  Note:   Objective:  Lab Results  Component Value Date   HGBA1C 7.6 (H) 07/12/2020 .   Lab Results  Component Value Date   CREATININE 3.26 (H) 07/22/2020   CREATININE 3.49 (H) 07/21/2020   CREATININE 3.54 (H) 07/12/2020 .   Marland Kitchen No results found for: EGFR Current Barriers:  Marland Kitchen Knowledge Deficits related to basic Diabetes pathophysiology and self care/management . Self-care deficits: none identified Case Manager Clinical Goal(s):  Over the next 6 months, patient will: demonstrate improved adherence to prescribed treatment plan for diabetes self care/management as evidenced by:  . daily monitoring and recording of CBG   . adherence to ADA/ carb modified diet  . contacting provider for new or worsened symptoms or questions Interventions:  . Collaboration with [name], MD regarding development and update of comprehensive plan of care as evidenced by provider attestation and co-signature . Inter-disciplinary care team collaboration (see longitudinal plan of care) . Chart reviewed including relevant office notes, upcoming  scheduled appointments, and lab results . Discussed current  clinical condition with patient and confirmed no current clinical or medication concerns around Diabetes-- reviewed with patient recent ED visit for vertigo, confirmed he remains dizzy with slight improvement post- ED discharge: encouraged patient to schedule post- ED discharge office visit with PCP, he declines needing assistance with this and states he will do if he does not see ongoing improvement . Discussed recent blood sugars at home and prn use of insulin: patient unable to review all of recent blood sugars as he is still in bed, resting due to ongoing dizziness; he reports blood sugars "have bee better," and he is able to verbalize accurate understanding of when to take insulin; reports blood  sugar last night of "106" . Confirmed patient has assistance with care at home: sister is staying with him temporarily and is able to provide transportation to provider appointments; patient currently not working due to ongoing vertigo . Confirmed patient attended yesterday's scheduled office visit with kidney/ renal provider- he confirms lab work was done and states that he is waiting to hear back with results/ advice . Confirmed patient continues to follow low-carb/ DM diet, "as much as possible;" he declines ongoing educational needs today: states unable to focus due to vertigo: support provided- encouraged patient to contact PCP office/ RN CM for any needs. Concerns, questions and he verbalizes understanding and agreement . Reviewed recent ENT provider office visit with patient- he reports to have ongoing follow up through [place] . Reviewed upcoming provider appointments with patient and confirmed that patient has plans to attend all as scheduled: renal radiation treatments scheduled for: May 10, 12, 16, 18, 20, 2022 Self-Care Activities . Self administers oral medications and insulin as prescribed . Attends all scheduled provider appointments . Checks blood sugars as prescribed  . Attempts to adhere to prescribed ADA/carb modified-- will need ongoing education/ reinforcement of strategies for adherence Patient Goals: Marland Kitchen Great job keeping up with checking blood sugars at least twice a day: first thing in the morning before you eat, and again 2 hours after a large meal: write these two values down in your notebook: keep doing this and will review during each of our telephone calls . Check blood sugar if I feel it is too high or too low . Enter blood sugar readings and use of insulin into daily log . Take the blood sugar log to all doctor visits- this will help your doctor know which medications you should be on and how your medications are working  . Call your doctor or care team if you develop  concerns or questions about your blood sugar readings Follow Up Plan:  . Telephone follow up appointment with care management team member scheduled for: Oct 04, 2020 at 10:15 am . The patient has been provided with contact information for the care management team and has been advised to call with any health related questions or concerns.     Care Plan : Hypertension (Adult)  Updates made by Knox Royalty, RN since 09/16/2020 12:00 AM    Problem: Hypertension (Hypertension)   Priority: Medium    Long-Range Goal: Hypertension Monitored   Start Date: 08/02/2020  Expected End Date: 02/02/2021  This Visit's Progress: On track  Recent Progress: On track  Priority: Medium  Note:   Objective:  . Last practice recorded BP readings:  BP Readings from Last 3 Encounters:  07/22/20 (!) 154/92  07/12/20 (!) 160/89  05/20/20 (!) 152/78   . Most  recent eGFR/CrCl: No results found for: EGFR  No components found for: CRCL Current Barriers:  Marland Kitchen Knowledge Deficits related to basic understanding of hypertension pathophysiology and self care management . Self care deficits: none identified today; barriers include challenges of preparing meals at home for one person (lives alone); multiple chronic conditions to manage, including diabetes and cancer Case Manager Clinical Goal(s):  Over the next 6 months, patient will demonstrate improved adherence to prescribed treatment plan for hypertension as evidenced by  . taking all medications as prescribed . monitoring and recording blood pressure at home twice weekly . adhering to low sodium/DASH diet Interventions:  . Collaboration with [name], MD regarding development and update of comprehensive plan of care as evidenced by provider attestation and co-signature . Inter-disciplinary care team collaboration (see longitudinal plan of care) . Evaluation of current treatment plan related to hypertension self management and patient's adherence to plan as  established by provider . Chart reviewed including relevant office notes, upcoming scheduled appointments, and lab results . Discussed current  clinical condition with patient and confirmed he has ongoing concerns around recently worsened chronic vertigo/ ear ringing, requiring ED visit this week- reviewed ED visit instructions and confirmed patient is taking meclizine, which he reports is "slowly helping" . Discussed fall prevention with patient, as he reports "several" recent falls without serious injury, due to vertigo: he declines needing to use cane/ walker, states "equipment would make it harder for me to walk;" encouraged patient to consider to and to contact RN CM/ PCP office if he would like a walker ordered-- reports he has a cane at home.  Confirmed patient has assistance with care at home: sister is staying with him temporarily and is able to provide transportation to provider appointments; patient currently not working due to ongoing vertigo. Marland Kitchen Confirmed patient continues to monitor/ record blood pressures at home- he states that "they are fine;" and he declines detailed review today as he remains in bed this morning due to symptoms of vertigo; encouraged patient to increase frequency of blood sugar monitoring at home during episode of vertigo . Encouraged patient to schedule post- ED discharge office visit with PCP, he declines needing assistance with this and states he will do if he does not see ongoing improvement . Reviewed upcoming provider appointments with patient and confirmed that patient has plans to attend all as scheduled: renal radiation treatments scheduled for: May 10, 12, 16, 18, 20, 2022 . Discussed plans with patient for ongoing care management follow up and provided patient with direct contact information for care management team: encouraged patient to contact RN CM sooner than scheduled appointment for any worsening of condition or concerns/ questions Self-Care  Activities: . Self administers medications as prescribed . Attends all scheduled provider appointments . Calls provider office for new concerns/ questions . Agrees to monitor/ record BP twice weekly for now . Attempts to follow a low sodium diet/DASH diet: will need ongoing reinforcement/ education Patient Goals: . Continue monitoring/ recording blood pressures at home twice weekly: please increase your blood pressure monitoring on the days you feel dizzy: low blood pressure can cause you to fall . Contact your PCP office if you have consistently low blood pressures- your medications may need to be adjusted if your blood pressures are low . Since you have been feeling dizzy recently- please take extra effort to prevent future falls at home: if you change your mind and wish for Dr. Ronnald Ramp to order you a walker at home, please  contact his office or your nurse care manager to arrange this . Write blood pressure results in a log or diary- this will help your doctor know if the medicines you are on are working; we will review your blood pressures at home during our future phone calls  . Continue your efforts to limit salt in your diet Follow Up Plan:  . Telephone follow up appointment with care management team member scheduled for: Oct 04, 2020 at 10:15 am . The patient has been provided with contact information for the care management team and has been advised to call with any health related questions or concerns.       Plan:   Telephone follow up appointment with care management team member scheduled for:  Monday, Oct 04, 2020 at 10:15 am  The patient has been provided with contact information for the care management team and has been advised to call with any health related questions or concerns.   Oneta Rack, RN, BSN, [facility] RN Care Coordination- [place] [phone]: direct office [phone]: mobile

## 2020-09-16 NOTE — Patient Instructions (Signed)
[facility] [place], it was nice talking with you today.   Please read over the attached information, and start now to take action to prevent any future falls- please contact myself or Dr. Ronnald Ramp if you change your mind and would like for [place] to assist you in getting a walker to use at home.  If your vertigo does not continue improving- please contact Dr. Ronnald Ramp to schedule an appointment   I look forward to talking to you again for an update on Monday, Oct 04, 2020 at 10:15 am- please be listening out for my call that day.  I will call as close to 10:15 am as possible; I look forward to hearing about your progress and sincerely hope you get to feeling better soon.   Please don't hesitate to contact me if I can be of assistance to you before our next scheduled appointment.   Oneta Rack, RN, BSN, [facility] RN Care Coordination- [place] [phone]: direct office [phone]: mobile    PATIENT GOALS: Goals Addressed            This Visit's Progress   . Monitor and Manage My Blood Sugar-Diabetes Type 2   On track    Timeframe:  Long-Range Goal Priority:  Medium Start Date:            08/02/20                 Expected End Date:  02/02/21                     Follow Up Date 10/04/20   . Great job keeping up with checking blood sugars at least twice a day: first thing in the morning before you eat, and again 2 hours after a large meal: write these two values down in your notebook: keep doing this and will review during each of our telephone calls . Check blood sugar if I feel it is too high or too low . Enter blood sugar readings and use of insulin into daily log . Take the blood sugar log to all doctor visits- this will help your doctor know which medications you should be on and how your medications are working  . Call your doctor or care team if you develop concerns or questions about your blood sugar readings   Why is this important?     Checking your blood sugar at home helps to keep it from getting very high or very low.   Writing the results in a diary or log helps the doctor know how to care for you.   Your blood sugar log should have the time, date and the results.   Also, write down the amount of insulin or other medicine that you take.   Other information, like what you ate, exercise done and how you were feeling, will also be helpful.         . Track and Manage My Blood Pressure-Hypertension   On track    Timeframe:  Long-Range Goal Priority:  Medium Start Date:            08/02/20                 Expected End Date:         02/02/21              Follow Up Date 10/04/20   . Continue monitoring/ recording blood pressures at home twice weekly:  please increase your blood pressure monitoring on the days you feel dizzy: low blood pressure can cause you to fall . Contact your PCP office if you have consistently low blood pressures- your medications may need to be adjusted if your blood pressures are low . Since you have been feeling dizzy recently- please take extra effort to prevent future falls at home: if you change your mind and wish for Dr. Ronnald Ramp to order you a walker at home, please contact his office or your nurse care manager to arrange this . Write blood pressure results in a log or diary- this will help your doctor know if the medicines you are on are working; we will review your blood pressures at home during our future phone calls  . Continue your efforts to limit salt in your diet   Why is this important?    You won't feel high blood pressure, but it can still hurt your blood vessels.   High blood pressure can cause heart or kidney problems. It can also cause a stroke.   Making lifestyle changes like losing a little weight or eating less salt will help.   Checking your blood pressure at home and at different times of the day can help to control blood pressure.   If the doctor prescribes medicine  remember to take it the way the doctor ordered.   Call the office if you cannot afford the medicine or if there are questions about it.           Fall Prevention in the Home, Adult Falls can cause injuries and can happen to people of all ages. There are many things you can do to make your home safe and to help prevent falls. Ask for help when making these changes. What actions can I take to prevent falls? General Instructions  Use good lighting in all rooms. Replace any light bulbs that burn out.  Turn on the lights in dark areas. Use night-lights.  Keep items that you use often in easy-to-reach places. Lower the shelves around your home if needed.  Set up your furniture so you have a clear path. Avoid moving your furniture around.  Do not have throw rugs or other things on the floor that can make you trip.  Avoid walking on wet floors.  If any of your floors are uneven, fix them.  Add color or contrast paint or tape to clearly mark and help you see: ? Grab bars or handrails. ? First and last steps of staircases. ? Where the edge of each step is.  If you use a stepladder: ? Make sure that it is fully opened. Do not climb a closed stepladder. ? Make sure the sides of the stepladder are locked in place. ? Ask someone to hold the stepladder while you use it.  Know where your pets are when moving through your home. What can I do in the bathroom?  Keep the floor dry. Clean up any water on the floor right away.  Remove soap buildup in the tub or shower.  Use nonskid mats or decals on the floor of the tub or shower.  Attach bath mats securely with double-sided, nonslip rug tape.  If you need to sit down in the shower, use a plastic, nonslip stool.  Install grab bars by the toilet and in the tub and shower. Do not use towel bars as grab bars.      What can I do in the bedroom?  Make sure that  you have a light by your bed that is easy to reach.  Do not use any sheets  or blankets for your bed that hang to the floor.  Have a firm chair with side arms that you can use for support when you get dressed. What can I do in the kitchen?  Clean up any spills right away.  If you need to reach something above you, use a step stool with a grab bar.  Keep electrical cords out of the way.  Do not use floor polish or wax that makes floors slippery. What can I do with my stairs?  Do not leave any items on the stairs.  Make sure that you have a light switch at the top and the bottom of the stairs.  Make sure that there are handrails on both sides of the stairs. Fix handrails that are broken or loose.  Install nonslip stair treads on all your stairs.  Avoid having throw rugs at the top or bottom of the stairs.  Choose a carpet that does not hide the edge of the steps on the stairs.  Check carpeting to make sure that it is firmly attached to the stairs. Fix carpet that is loose or worn. What can I do on the outside of my home?  Use bright outdoor lighting.  Fix the edges of walkways and driveways and fix any cracks.  Remove anything that might make you trip as you walk through a door, such as a raised step or threshold.  Trim any bushes or trees on paths to your home.  Check to see if handrails are loose or broken and that both sides of all steps have handrails.  Install guardrails along the edges of any raised decks and porches.  Clear paths of anything that can make you trip, such as tools or rocks.  Have leaves, snow, or ice cleared regularly.  Use sand or salt on paths during winter.  Clean up any spills in your garage right away. This includes grease or oil spills. What other actions can I take?  Wear shoes that: ? Have a low heel. Do not wear high heels. ? Have rubber bottoms. ? Feel good on your feet and fit well. ? Are closed at the toe. Do not wear open-toe sandals.  Use tools that help you move around if needed. These  include: ? Canes. ? Walkers. ? Scooters. ? Crutches.  Review your medicines with your doctor. Some medicines can make you feel dizzy. This can increase your chance of falling. Ask your doctor what else you can do to help prevent falls. Where to find more information  Centers for Disease Control and Prevention, STEADI: http://www.wolf.info/  National Institute on Aging: http://kim-miller.com/ Contact a doctor if:  You are afraid of falling at home.  You feel weak, drowsy, or dizzy at home.  You fall at home. Summary  There are many simple things that you can do to make your home safe and to help prevent falls.  Ways to make your home safe include removing things that can make you trip and installing grab bars in the bathroom.  Ask for help when making these changes in your home. This information is not intended to replace advice given to you by your health care provider. Make sure you discuss any questions you have with your health care provider. Document Revised: 12/03/2019 Document Reviewed: 12/03/2019 Elsevier Patient Education  [place].    The patient verbalized understanding of instructions, educational materials,  and care plan provided today and agreed to receive a mailed copy of patient instructions, educational materials, and care plan.   Telephone follow up appointment with care management team member scheduled for:  The patient has been provided with contact information for the care management team and has been advised to call with any health related questions or concerns.   Oneta Rack, RN, BSN, [facility] RN Care Coordination- [place] [phone]: direct office [phone]: mobile

## 2020-09-20 DIAGNOSIS — C641 Malignant neoplasm of right kidney, except renal pelvis: Secondary | ICD-10-CM | POA: Diagnosis not present

## 2020-09-20 DIAGNOSIS — C787 Secondary malignant neoplasm of liver and intrahepatic bile duct: Secondary | ICD-10-CM | POA: Diagnosis not present

## 2020-09-20 DIAGNOSIS — C649 Malignant neoplasm of unspecified kidney, except renal pelvis: Secondary | ICD-10-CM | POA: Insufficient documentation

## 2020-09-21 ENCOUNTER — Ambulatory Visit: Payer: Medicare Other | Admitting: Radiation Oncology

## 2020-09-22 ENCOUNTER — Ambulatory Visit: Payer: Medicare Other | Admitting: Radiation Oncology

## 2020-09-22 DIAGNOSIS — E1122 Type 2 diabetes mellitus with diabetic chronic kidney disease: Secondary | ICD-10-CM | POA: Diagnosis not present

## 2020-09-22 DIAGNOSIS — C649 Malignant neoplasm of unspecified kidney, except renal pelvis: Secondary | ICD-10-CM | POA: Diagnosis not present

## 2020-09-22 DIAGNOSIS — N184 Chronic kidney disease, stage 4 (severe): Secondary | ICD-10-CM | POA: Diagnosis not present

## 2020-09-23 ENCOUNTER — Ambulatory Visit: Payer: Medicare Other | Admitting: Radiation Oncology

## 2020-09-23 ENCOUNTER — Telehealth: Payer: Self-pay | Admitting: Internal Medicine

## 2020-09-23 ENCOUNTER — Other Ambulatory Visit: Payer: Self-pay | Admitting: Internal Medicine

## 2020-09-23 DIAGNOSIS — R42 Dizziness and giddiness: Secondary | ICD-10-CM | POA: Insufficient documentation

## 2020-09-23 NOTE — Telephone Encounter (Signed)
Patient states there is something at [facility] that can be admitted to help with his equilibrium. Patient trying to see if Dr. Ronnald Ramp can get him admitted into that.

## 2020-09-23 NOTE — Telephone Encounter (Signed)
Pt stated that he has been having an issue with vertigo again. He stated that he could be admitted to the [facility] for treatment but they would need a referral from PCP for that. Is that something the pt would need an OV for prior to the referral being entered. Please advise.

## 2020-09-24 ENCOUNTER — Telehealth: Payer: Self-pay | Admitting: Radiation Oncology

## 2020-09-24 ENCOUNTER — Ambulatory Visit: Payer: Medicare Other | Admitting: Radiation Oncology

## 2020-09-24 NOTE — Telephone Encounter (Signed)
Received voicemail message from patient trying to get a hold of the "red machine." Sent email message to [facility] therapist requesting they contact this patient at [phone].

## 2020-09-27 ENCOUNTER — Other Ambulatory Visit: Payer: Self-pay

## 2020-09-27 ENCOUNTER — Ambulatory Visit
Admission: RE | Admit: 2020-09-27 | Discharge: 2020-09-27 | Disposition: A | Discharge status: Home / Self Care | Payer: Medicare Other | Source: Ambulatory Visit | Attending: Radiation Oncology | Admitting: Radiation Oncology

## 2020-09-27 DIAGNOSIS — C649 Malignant neoplasm of unspecified kidney, except renal pelvis: Secondary | ICD-10-CM | POA: Diagnosis not present

## 2020-09-28 NOTE — Telephone Encounter (Signed)
I have contact the pt informing him that the referral was placed and per the referral note he would be receiving a phone call to be scheduled. He expressed understanding.

## 2020-09-28 NOTE — Telephone Encounter (Signed)
   Follow up    Please return call to patient to discuss referrals

## 2020-09-29 ENCOUNTER — Other Ambulatory Visit: Payer: Self-pay

## 2020-09-29 ENCOUNTER — Ambulatory Visit
Admission: RE | Admit: 2020-09-29 | Discharge: 2020-09-29 | Disposition: A | Discharge status: Home / Self Care | Payer: Medicare Other | Source: Ambulatory Visit | Attending: Radiation Oncology | Admitting: Radiation Oncology

## 2020-09-29 DIAGNOSIS — C649 Malignant neoplasm of unspecified kidney, except renal pelvis: Secondary | ICD-10-CM | POA: Diagnosis not present

## 2020-10-01 ENCOUNTER — Ambulatory Visit
Admission: RE | Admit: 2020-10-01 | Discharge: 2020-10-01 | Disposition: A | Discharge status: Home / Self Care | Payer: Medicare Other | Source: Ambulatory Visit | Attending: Radiation Oncology | Admitting: Radiation Oncology

## 2020-10-01 ENCOUNTER — Other Ambulatory Visit: Payer: Self-pay

## 2020-10-01 DIAGNOSIS — C649 Malignant neoplasm of unspecified kidney, except renal pelvis: Secondary | ICD-10-CM | POA: Diagnosis not present

## 2020-10-04 ENCOUNTER — Ambulatory Visit: Payer: Medicare Other | Admitting: *Deleted

## 2020-10-04 ENCOUNTER — Ambulatory Visit: Payer: Medicare Other | Admitting: Radiation Oncology

## 2020-10-04 DIAGNOSIS — E118 Type 2 diabetes mellitus with unspecified complications: Secondary | ICD-10-CM | POA: Diagnosis not present

## 2020-10-04 DIAGNOSIS — I10 Essential (primary) hypertension: Secondary | ICD-10-CM | POA: Diagnosis not present

## 2020-10-04 NOTE — Patient Instructions (Signed)
Visit Information  [place], it was nice talking with you today   Please read over the attached information, and keep up the great work monitoring and writing down your blood sugars and blood pressures at home  As we discussed today, I contacted the [facility] and asked them to call to schedule an appointment with you for evaluation of your ongoing dizziness-- their number is [phone], and the address is: [address], [address]-- if you have not heard from them by the time you receive this in the mail, please contact me, I will help get this referral started    I look forward to talking to you again for an update on Monday October 18, 2020 at 10:30 am - please be listening out for my call that day.  I will call as close to 10:30 am as possible; I look forward to hearing about your progress.  Please have an updated list of your medications ready for [place] to review together during our telephone call on October 18, 2020   Please don't hesitate to contact me if I can be of assistance to you before our next scheduled appointment.   Oneta Rack, RN, BSN, [facility] RN Care Coordination- [place] [phone]: direct office [phone]: mobile    PATIENT GOALS: Goals Addressed            This Visit's Progress   . Monitor and Manage My Blood Sugar-Diabetes Type 2   On track    Timeframe:  Long-Range Goal Priority:  Medium Start Date:            08/02/20                 Expected End Date:  02/02/21                     Follow Up Date 10/18/20   . Great job keeping up with checking blood sugars at least twice a day: first thing in the morning before you eat, and again 2 hours after a large meal: write these two values down in your notebook: keep doing this and will review during each of our telephone calls . Check blood sugar if I feel it is too high or too low . Enter blood sugar readings and use of insulin into daily  log . Take the blood sugar log to all doctor visits- this will help your doctor know which medications you should be on and how your medications are working  . Call your doctor or care team if you develop concerns or questions about your blood sugar readings   Why is this important?    Checking your blood sugar at home helps to keep it from getting very high or very low.   Writing the results in a diary or log helps the doctor know how to care for you.   Your blood sugar log should have the time, date and the results.   Also, write down the amount of insulin or other medicine that you take.   Other information, like what you ate, exercise done and how you were feeling, will also be helpful.         . Track and Manage My Blood Pressure-Hypertension   On track    Timeframe:  Long-Range Goal Priority:  Medium Start Date:            08/02/20  Expected End Date:         02/02/21              Follow Up Date 10/18/20   . We will review your medications the next time we talk by phone on October 18, 2020 at 10:30 am- please have an updated list of your medications ready . Continue monitoring/ recording blood pressures at home twice weekly: please increase your blood pressure monitoring on the days you feel dizzy: low blood pressure can cause you to fall . Contact your PCP office if you have consistently low or high blood pressures- your medications may need to be adjusted if your blood pressures are not in normal range . Since you have been feeling dizzy recently- please take extra effort to prevent future falls at home: if you change your mind and wish for Dr. Ronnald Ramp to order you a walker at home, please contact his office or your nurse care manager to arrange this . Write blood pressure results in a log or diary- this will help your doctor know if the medicines you are on are working; we will review your blood pressures at home during our future phone calls  . Continue your efforts to  limit salt in your diet   Why is this important?    You won't feel high blood pressure, but it can still hurt your blood vessels.   High blood pressure can cause heart or kidney problems. It can also cause a stroke.   Making lifestyle changes like losing a little weight or eating less salt will help.   Checking your blood pressure at home and at different times of the day can help to control blood pressure.   If the doctor prescribes medicine remember to take it the way the doctor ordered.   Call the office if you cannot afford the medicine or if there are questions about it.           Vertigo Vertigo is the feeling that you or the things around you are moving when they are not. This feeling can come and go at any time. Vertigo often goes away on its own. This condition can be dangerous if it happens when you are doing activities like driving or working with machines. Your doctor will do tests to find the cause of your vertigo. These tests will also help your doctor decide on the best treatment for you. Follow these instructions at home: Eating and drinking  Drink enough fluid to keep your pee (urine) pale yellow.  Do not drink alcohol.      Activity  Return to your normal activities as told by your doctor. Ask your doctor what activities are safe for you.  In the morning, first sit up on the side of the bed. When you feel okay, stand slowly while you hold onto something until you know that your balance is fine.  Move slowly. Avoid sudden body or head movements or certain positions, as told by your doctor.  Use a cane if you have trouble standing or walking.  Sit down right away if you feel dizzy.  Avoid doing any tasks or activities that can cause danger to you or others if you get dizzy.  Avoid bending down if you feel dizzy. Place items in your home so that they are easy for you to reach without leaning over.  Do not drive or use heavy machinery if you feel  dizzy. General instructions  Take over-the-counter and prescription medicines  only as told by your doctor.  Keep all follow-up visits as told by your doctor. This is important. Contact a doctor if:  Your medicine does not help your vertigo.  You have a fever.  Your problems get worse or you have new symptoms.  Your family or friends see changes in your behavior.  The feeling of being sick to your stomach gets worse.  Your vomiting gets worse.  You lose feeling (have numbness) in part of your body.  You feel prickling and tingling in a part of your body. Get help right away if:  You have trouble moving or talking.  You are always dizzy.  You pass out (faint).  You get very bad headaches.  You feel weak in your hands, arms, or legs.  You have changes in your hearing.  You have changes in how you see (vision).  You get a stiff neck.  Bright light starts to bother you. Summary  Vertigo is the feeling that you or the things around you are moving when they are not.  Your doctor will do tests to find the cause of your vertigo.  You may be told to avoid some tasks, positions, or movements.  Contact a doctor if your medicine is not helping, or if you have a fever, new symptoms, or a change in behavior.  Get help right away if you get very bad headaches, or if you have changes in how you speak, hear, or see. This information is not intended to replace advice given to you by your health care provider. Make sure you discuss any questions you have with your health care provider. Document Revised: 03/25/2018 Document Reviewed: 03/25/2018 Elsevier Patient Education  2021 [employer].    The patient verbalized understanding of instructions, educational materials, and care plan provided today and agreed to receive a mailed copy of patient instructions, educational materials, and care plan  Telephone follow up appointment with care management team member scheduled for:   Monday October 18, 2020 at 10:30 am  The patient has been provided with contact information for the care management team and has been advised to call with any health related questions or concerns.   Oneta Rack, RN, BSN, [facility] RN Care Coordination- [place] [phone]: direct office [phone]: mobile

## 2020-10-04 NOTE — Chronic Care Management (AMB) (Signed)
Chronic Care Management   CCM RN Visit Note  10/04/2020 Name: Antonio Adams MRN: [id number] DOB: May 12, 1946  Subjective: Antonio Adams is a 75 y.o. year old male who is a primary care patient of [name], MD. The care management team was consulted for assistance with disease management and care coordination needs.    Engaged with patient by telephone for follow up visit in response to provider referral for case management and/or care coordination services.   Consent to Services:  The patient was given information about Chronic Care Management services, agreed to services, and gave verbal consent prior to initiation of services.  Please see initial visit note for detailed documentation.  Patient agreed to services and verbal consent obtained.   Assessment: Review of patient past medical history, allergies, medications, health status, including review of consultants reports, laboratory and other test data, was performed as part of comprehensive evaluation and provision of chronic care management services.    CCM Care Plan  Allergies  Allergen Reactions  . Ace Inhibitors Swelling  . Lisinopril Swelling    Outpatient Encounter Medications as of 10/04/2020  Medication Sig  . acetaminophen (TYLENOL) 500 MG tablet Take 2 tablets (1,000 mg total) by mouth every 6 (six) hours. (Patient not taking: No sig reported)  . amLODipine (NORVASC) 5 MG tablet Take 1 tablet (5 mg total) by mouth daily.  Marland Kitchen aspirin EC 81 MG tablet Take 81 mg by mouth daily. Swallow whole.  Marland Kitchen atorvastatin (LIPITOR) 40 MG tablet Take 1 tablet (40 mg total) by mouth daily.  . cholecalciferol (VITAMIN D) 1000 units tablet Take 1,000 Units by mouth daily.  . Continuous Blood Gluc Receiver (FREESTYLE LIBRE 14 DAY READER) DEVI 1 Act by Does not apply route daily.  . Continuous Blood Gluc Sensor (FREESTYLE LIBRE 2 SENSOR) MISC 1 Act by Does not apply route daily.  Marland Kitchen diltiazem (TIAZAC) 120 MG 24 hr capsule  Take 120 mg by mouth daily.  Marland Kitchen docusate sodium (COLACE) 100 MG capsule Take 1 capsule (100 mg total) by mouth 2 (two) times daily.  . ferrous sulfate 325 (65 FE) MG tablet Take 1 tablet (325 mg total) by mouth 2 (two) times daily. Patient needs office visit for further refills (Patient taking differently: Take 325 mg by mouth daily with breakfast.)  . gabapentin (NEURONTIN) 100 MG capsule Take 100 mg by mouth at bedtime.  Marland Kitchen gemfibrozil (LOPID) 600 MG tablet Take 600 mg by mouth 2 (two) times daily before a meal.  . hydrOXYzine (ATARAX/VISTARIL) 25 MG tablet Take 25 mg by mouth 3 (three) times daily as needed for anxiety, itching or nausea.   . indapamide (LOZOL) 1.25 MG tablet TAKE 1 TABLET (1.25 MG TOTAL) BY MOUTH DAILY.  Marland Kitchen insulin glargine (LANTUS SOLOSTAR) 100 UNIT/ML Solostar Pen Inject 30 Units into the skin daily as needed (blood sugar over 200).  . Insulin Pen Needle 32G X 6 MM MISC 1 Act by Does not apply route daily.  Marland Kitchen lisinopril (ZESTRIL) 40 MG tablet TAKE ONE-HALF TABLET BY MOUTH EVERY DAY FOR BLOOD PRESSURE  . meclizine (ANTIVERT) 25 MG tablet Take 1 tablet (25 mg total) by mouth 3 (three) times daily as needed for dizziness.  . Omega-3 Fatty Acids (FISH OIL PO) Take 1 tablet by mouth daily.  . ondansetron (ZOFRAN ODT) 4 MG disintegrating tablet 46m ODT q4 hours prn nausea/vomit  . ONE TOUCH ULTRA TEST test strip USE 2 (TWO) TIMES DAILY. AND LANCETS 2/DAY 250.03  . OVER THE  COUNTER MEDICATION Take 1 tablet by mouth 2 (two) times daily. Focus factor  . Oxymetazoline HCl (VICKS SINEX NA) Place 1 spray into the nose as needed (congestion).  . polyvinyl alcohol (LIQUIFILM TEARS) 1.4 % ophthalmic solution Place 1 drop into both eyes as needed for dry eyes.  Marland Kitchen solifenacin (VESICARE) 5 MG tablet Take 1 tablet (5 mg total) by mouth daily.  . tamsulosin (FLOMAX) 0.4 MG CAPS capsule TAKE 1 CAPSULE (0.4 MG TOTAL) BY MOUTH DAILY. (Patient taking differently: Take 0.4 mg by mouth daily.)  .  traMADol (ULTRAM) 50 MG tablet Take 50-100 mg by mouth every 6 (six) hours as needed for pain.   No facility-administered encounter medications on file as of 10/04/2020.    Patient Active Problem List   Diagnosis Date Noted  . Vertigo 09/23/2020  . Encounter for general adult medical examination with abnormal findings 05/20/2020  . Tinnitus aurium, bilateral 05/20/2020  . Metastatic renal cell carcinoma ([place]) 02/26/2020  . Diabetic polyneuropathy associated with type 2 diabetes mellitus ([place]) 12/18/2019  . CKD (chronic kidney disease) stage 3, GFR 30-59 ml/min ([facility]) 10/08/2018  . Murmur, cardiac 10/08/2018  . DDD (degenerative disc disease), cervical 10/08/2018  . OAB (overactive bladder) 04/04/2018  . Seasonal allergic rhinitis due to pollen 11/01/2016  . Microalbuminuria due to type 2 diabetes mellitus ([place]) 07/27/2016  . Renal cell carcinoma ([place]) 10/06/2015  . Varicose veins of both lower extremities 08/06/2015  . Varicose veins of lower extremities with ulcer ([place]) 06/16/2015  . Internal hemorrhoid, bleeding 08/03/2014  . Post traumatic stress disorder (PTSD) 01/29/2014  . Deficiency anemia 12/10/2013  . Radiation proctitis 06/04/2012  . Spinal stenosis of lumbar region with radiculopathy 10/30/2011    Class: Chronic  . OSA (obstructive sleep apnea) 11/24/2010  . Obesity, Class I, BMI 30-34.9 11/24/2010  . Routine general medical examination at a health care facility 11/24/2010  . ADENOCARCINOMA, PROSTATE, GLEASON GRADE 6 12/21/2009  . Type II diabetes mellitus with manifestations ([place]) 03/16/2007  . Hyperlipidemia with target LDL less than 100 03/16/2007  . Essential hypertension 03/16/2007  . GERD 03/16/2007    Conditions to be addressed/monitored:HTN and DMII  Care Plan : Diabetes Type 2 (Adult)  Updates made by Knox Royalty, RN since 10/04/2020 12:00 AM    Problem: Glycemic Management (Diabetes, Type 2)   Priority: Medium    Long-Range Goal: Glycemic Management  Optimized   Start Date: 08/02/2020  Expected End Date: 02/02/2021  This Visit's Progress: On track  Recent Progress: On track  Priority: Medium  Note:   Objective:  Lab Results  Component Value Date   HGBA1C 7.6 (H) 07/12/2020 .   Lab Results  Component Value Date   CREATININE 3.26 (H) 07/22/2020   CREATININE 3.49 (H) 07/21/2020   CREATININE 3.54 (H) 07/12/2020 .   Marland Kitchen No results found for: EGFR Current Barriers:  Marland Kitchen Knowledge Deficits related to basic Diabetes pathophysiology and self care/management . Self-care deficits: none identified Case Manager Clinical Goal(s):  Over the next 6 months, patient will: demonstrate improved adherence to prescribed treatment plan for diabetes self care/management as evidenced by:  . daily monitoring and recording of CBG   . adherence to ADA/ carb modified diet  . contacting provider for new or worsened symptoms or questions Interventions:  . Collaboration with [name], MD regarding development and update of comprehensive plan of care as evidenced by provider attestation and co-signature . Inter-disciplinary care team collaboration (see longitudinal plan of care) . Chart reviewed  including relevant office notes, upcoming scheduled appointments, and lab results . Discussed current  clinical condition with patient and confirmed no current clinical or medication concerns around Diabetes-- reviewed with patient recent blood sugars at home and he reports general fasting ranges between 94-110; reports today at "95;" reports isolated post-prandial reading of "182;" confirms he is mainly monitoring blood sugars fasting q am . Discussed use of insulin: patient continues to report only using for blood sugars > 200; reports last used 35 Units on 09/27/20; confirms using between 30-35 units each time, as instructed . Confirmed patient's vertigo "slightly better;" patient's sister has returned to her home and patient is able to manage ADL's/ iADL's  independently- he denies recent falls post- last outreach on 09/16/20; continues using cane as/ if needed; has resumed driving self to appointments/ errands . 11:15 am: Care coordination outreach placed with [facility]- patient expresses frustration that he has not heard from them to schedule after PCP referral re-placed on 09/23/20: [phone]- spoke with [facility] who agrees to contact patient now to schedule . Reviewed recent renal provider office visit with patient: he reports he attended 09/16/20 office visit and "she changed a lot of my medications;" patient declines medication review today as he has a follow up appointment scheduled with renal provider this afternoon and is trying to get ready: he is agreeable to medication review in 2 weeks at time of scheduled RN CM outreach call- explained importance of PCP provider having accurate medication list, he verbalizes understanding . Confirmed patient has attended all radiation sessions and is tolerating well; 2 more this week and then he will be finished for now . Confirmed patient continues to follow low-carb/ DM diet, "as much as possible;"  . Reviewed upcoming provider appointments with patient and confirmed that patient has plans to attend all as scheduled: renal provider this afternoon at 12:45 pm and radiation scheduled for: May 24, 26 2022 Self-Care Activities . Self administers oral medications and insulin as prescribed . Attends all scheduled provider appointments . Checks blood sugars as prescribed  . Attempts to adhere to prescribed ADA/carb modified-- will need ongoing education/ reinforcement of strategies for adherence Patient Goals: Marland Kitchen Great job keeping up with checking blood sugars at least twice a day: first thing in the morning before you eat, and again 2 hours after a large meal: write these two values down in your notebook: keep doing this and will review during each of our telephone calls . Check blood sugar  if I feel it is too high or too low . Enter blood sugar readings and use of insulin into daily log . Take the blood sugar log to all doctor visits- this will help your doctor know which medications you should be on and how your medications are working  . Call your doctor or care team if you develop concerns or questions about your blood sugar readings Follow Up Plan:  . Telephone follow up appointment with care management team member scheduled for: October 18, 2020 at 10:30 am . The patient has been provided with contact information for the care management team and has been advised to call with any health related questions or concerns.     Care Plan : Hypertension (Adult)  Updates made by Knox Royalty, RN since 10/04/2020 12:00 AM    Problem: Hypertension (Hypertension)   Priority: Medium    Long-Range Goal: Hypertension Monitored   Start Date: 08/02/2020  Expected End Date: 02/02/2021  This Visit's Progress: On  track  Recent Progress: On track  Priority: Medium  Note:   Objective:  . Last practice recorded BP readings:  BP Readings from Last 3 Encounters:  07/22/20 (!) 154/92  07/12/20 (!) 160/89  05/20/20 (!) 152/78   . Most recent eGFR/CrCl: No results found for: EGFR  No components found for: CRCL Current Barriers:  Marland Kitchen Knowledge Deficits related to basic understanding of hypertension pathophysiology and self care management . Self care deficits: none identified today; barriers include challenges of preparing meals at home for one person (lives alone); multiple chronic conditions to manage, including diabetes and cancer Case Manager Clinical Goal(s):  Over the next 6 months, patient will demonstrate improved adherence to prescribed treatment plan for hypertension as evidenced by  . taking all medications as prescribed . monitoring and recording blood pressure at home twice weekly . adhering to low sodium/DASH diet Interventions:  . Collaboration with [name], MD regarding  development and update of comprehensive plan of care as evidenced by provider attestation and co-signature . Inter-disciplinary care team collaboration (see longitudinal plan of care) . Evaluation of current treatment plan related to hypertension self management and patient's adherence to plan as established by provider . Chart reviewed including relevant office notes, upcoming scheduled appointments, and lab results . Discussed current  clinical condition with patient and confirmed he believes that his previously reported vertigo is "somewhat/ slightly better;" again encouraged patient to make prompt PCP appointment as needed for ongoing vertigo- patient will consider  . Confirmed patient continues to monitor/ record blood pressures at home- reviewed with patient today-- reports blood pressure today of 144/88; with lowest reading this week of 130/78: encouraged pateint to continue recording blood pressures at home for ongoing review- her verbalizes understanding/ agreement . Confirmed patient continues to endorse following low-sodium diet: positive reinforcement provided . Reviewed upcoming provider appointments with patient and confirmed that patient has plans to attend all as scheduled: renal radiation treatments scheduled for: May 24 and 26, 2022 . Discussed plans with patient for ongoing care management follow up and provided patient with direct contact information for care management team: encouraged patient to contact RN CM sooner than scheduled appointment for any worsening of condition or concerns/ questions Self-Care Activities: . Self administers medications as prescribed . Attends all scheduled provider appointments . Calls provider office for new concerns/ questions . Agrees to monitor/ record BP twice weekly for now . Attempts to follow a low sodium diet/DASH diet: will need ongoing reinforcement/ education Patient Goals: . We will review your medications the next time we talk by phone  on October 18, 2020 at 10:30 am- please have an updated list of your medications ready . Continue monitoring/ recording blood pressures at home twice weekly: please increase your blood pressure monitoring on the days you feel dizzy: low blood pressure can cause you to fall . Contact your PCP office if you have consistently low or high blood pressures- your medications may need to be adjusted if your blood pressures are not in normal range . Since you have been feeling dizzy recently- please take extra effort to prevent future falls at home: if you change your mind and wish for Dr. Ronnald Ramp to order you a walker at home, please contact his office or your nurse care manager to arrange this . Write blood pressure results in a log or diary- this will help your doctor know if the medicines you are on are working; we will review your blood pressures at home during our  future phone calls  . Continue your efforts to limit salt in your diet Follow Up Plan:  . Telephone follow up appointment with care management team member scheduled for: October 18, 2020 at 10:30 am . The patient has been provided with contact information for the care management team and has been advised to call with any health related questions or concerns.       Plan:  Telephone follow up appointment with care management team member scheduled for:  Monday, October 18, 2020 at 10:30 am  The patient has been provided with contact information for the care management team and has been advised to call with any health related questions or concerns.   Oneta Rack, RN, BSN, [facility] RN Care Coordination- [place] [phone]: direct office [phone]: mobile

## 2020-10-05 ENCOUNTER — Ambulatory Visit
Admission: RE | Admit: 2020-10-05 | Discharge: 2020-10-05 | Disposition: A | Discharge status: Home / Self Care | Payer: Medicare Other | Source: Ambulatory Visit | Attending: Radiation Oncology | Admitting: Radiation Oncology

## 2020-10-05 ENCOUNTER — Other Ambulatory Visit: Payer: Self-pay

## 2020-10-05 DIAGNOSIS — C649 Malignant neoplasm of unspecified kidney, except renal pelvis: Secondary | ICD-10-CM

## 2020-10-06 ENCOUNTER — Telehealth: Payer: Self-pay | Admitting: *Deleted

## 2020-10-06 DIAGNOSIS — N184 Chronic kidney disease, stage 4 (severe): Secondary | ICD-10-CM | POA: Diagnosis not present

## 2020-10-06 DIAGNOSIS — I129 Hypertensive chronic kidney disease with stage 1 through stage 4 chronic kidney disease, or unspecified chronic kidney disease: Secondary | ICD-10-CM | POA: Diagnosis not present

## 2020-10-06 DIAGNOSIS — C649 Malignant neoplasm of unspecified kidney, except renal pelvis: Secondary | ICD-10-CM | POA: Diagnosis not present

## 2020-10-06 DIAGNOSIS — H8110 Benign paroxysmal vertigo, unspecified ear: Secondary | ICD-10-CM | POA: Diagnosis not present

## 2020-10-06 NOTE — Telephone Encounter (Signed)
  Care Management   Follow Up Note   10/06/2020 Name: Antonio Adams MRN: [id number] DOB: 03-01-1946   Referred by: [name], MD Reason for referral : Chronic Care Management (CCM RN CM Care Coordination F/U- unsuccessful outreach)  An unsuccessful telephone outreach was attempted today. The patient was referred to the case management team for assistance with care management and care coordination.   Care Coordination outreach attempt made today to follow up with patient around referral to [facility]: updated patient in voice message that I had reached out and confirmed that patient has active referral; left phone number for [facility] [phone]) and encouraged patient to contact them for scheduling.  Follow Up Plan:   A HIPPA compliant phone message was left for the patient providing my contact information and requesting a return call if concerns/ questions  Previously scheduled CCM RN CM follow up telephone call for October 18, 2020 at 10:30 am   Oneta Rack, RN, BSN, [facility] [phone]: direct office [phone]: mobile

## 2020-10-07 ENCOUNTER — Ambulatory Visit
Admission: RE | Admit: 2020-10-07 | Discharge: 2020-10-07 | Disposition: A | Discharge status: Home / Self Care | Payer: Medicare Other | Source: Ambulatory Visit | Attending: Radiation Oncology | Admitting: Radiation Oncology

## 2020-10-07 ENCOUNTER — Other Ambulatory Visit: Payer: Self-pay

## 2020-10-07 ENCOUNTER — Encounter: Payer: Self-pay | Admitting: Urology

## 2020-10-07 DIAGNOSIS — C787 Secondary malignant neoplasm of liver and intrahepatic bile duct: Secondary | ICD-10-CM | POA: Diagnosis not present

## 2020-10-07 DIAGNOSIS — C641 Malignant neoplasm of right kidney, except renal pelvis: Secondary | ICD-10-CM | POA: Diagnosis not present

## 2020-10-07 DIAGNOSIS — C649 Malignant neoplasm of unspecified kidney, except renal pelvis: Secondary | ICD-10-CM | POA: Diagnosis not present

## 2020-10-07 NOTE — Progress Notes (Signed)
Received patient in the clinic following final radiation treatment. Provided patient with a one month follow up appointment card. Answered all patient questions to the best of my ability. Patient verbalized understanding of all reviewed. Patient exited the clinic, ambulatory. No distress noted.

## 2020-10-12 ENCOUNTER — Ambulatory Visit: Payer: Medicare Other | Admitting: Physical Therapy

## 2020-10-12 DIAGNOSIS — N281 Cyst of kidney, acquired: Secondary | ICD-10-CM | POA: Diagnosis not present

## 2020-10-12 DIAGNOSIS — C641 Malignant neoplasm of right kidney, except renal pelvis: Secondary | ICD-10-CM | POA: Diagnosis not present

## 2020-10-12 DIAGNOSIS — I700 Atherosclerosis of aorta: Secondary | ICD-10-CM | POA: Diagnosis not present

## 2020-10-12 DIAGNOSIS — I2510 Atherosclerotic heart disease of native coronary artery without angina pectoris: Secondary | ICD-10-CM | POA: Diagnosis not present

## 2020-10-18 ENCOUNTER — Ambulatory Visit (INDEPENDENT_AMBULATORY_CARE_PROVIDER_SITE_OTHER): Payer: Medicare Other | Admitting: *Deleted

## 2020-10-18 DIAGNOSIS — I10 Essential (primary) hypertension: Secondary | ICD-10-CM

## 2020-10-18 DIAGNOSIS — E118 Type 2 diabetes mellitus with unspecified complications: Secondary | ICD-10-CM | POA: Diagnosis not present

## 2020-10-18 NOTE — Chronic Care Management (AMB) (Signed)
Chronic Care Management   CCM RN Visit Note  10/18/2020 Name: Antonio Adams MRN: [id number] DOB: Mar 28, 1946  Subjective: Antonio Adams is a 75 y.o. year old male who is a primary care patient of [name], MD. The care management team was consulted for assistance with disease management and care coordination needs.    Engaged with patient by telephone for follow up visit in response to provider referral for case management and/or care coordination services.   Consent to Services:  The patient was given information about Chronic Care Management services, agreed to services, and gave verbal consent prior to initiation of services.  Please see initial visit note for detailed documentation.  Patient agreed to services and verbal consent obtained.   Assessment: Review of patient past medical history, allergies, medications, health status, including review of consultants reports, laboratory and other test data, was performed as part of comprehensive evaluation and provision of chronic care management services.    CCM Care Plan  Allergies  Allergen Reactions  . Ace Inhibitors Swelling  . Lisinopril Swelling    Outpatient Encounter Medications as of 10/18/2020  Medication Sig Note  . aspirin EC 81 MG tablet Take 81 mg by mouth daily. Swallow whole.   Marland Kitchen atorvastatin (LIPITOR) 40 MG tablet Take 1 tablet (40 mg total) by mouth daily. 10/18/2020: 10/18/20: Patient reports taking one half tablet QD- states this is how [place] PCP instructed him to take  . cholecalciferol (VITAMIN D) 1000 units tablet Take 1,000 Units by mouth daily.   Marland Kitchen dimenhyDRINATE (DRAMAMINE) 50 MG tablet Take 50 mg by mouth every 8 (eight) hours as needed for dizziness (vertigo). 10/18/2020: 10/18/20: Patient reports takes prn, OTC for vertigo, reports 2-3 times per day  . ferrous sulfate 325 (65 FE) MG tablet Take 1 tablet (325 mg total) by mouth 2 (two) times daily. Patient needs office visit for further refills  (Patient taking differently: Take 325 mg by mouth daily with breakfast.)   . gabapentin (NEURONTIN) 100 MG capsule Take 100 mg by mouth at bedtime.   Marland Kitchen gemfibrozil (LOPID) 600 MG tablet Take 600 mg by mouth 2 (two) times daily before a meal.   . insulin glargine (LANTUS SOLOSTAR) 100 UNIT/ML Solostar Pen Inject 30 Units into the skin daily as needed (blood sugar over 200). 10/18/2020: 10/18/20: Patient reports has not needed recently  . Insulin Pen Needle 32G X 6 MM MISC 1 Act by Does not apply route daily.   . meclizine (ANTIVERT) 25 MG tablet Take 1 tablet (25 mg total) by mouth 3 (three) times daily as needed for dizziness. 10/18/2020: 10/18/20: Patient reports taking occasionally- also uses Dramamine OTC for vertigo  . Omega-3 Fatty Acids (FISH OIL PO) Take 1 tablet by mouth daily.   . ONE TOUCH ULTRA TEST test strip USE 2 (TWO) TIMES DAILY. AND LANCETS 2/DAY 250.03   . OVER THE COUNTER MEDICATION Take 1 tablet by mouth 2 (two) times daily. Focus factor 10/18/2020: 10/18/20: Today, patient reports taking QD  . polyvinyl alcohol (LIQUIFILM TEARS) 1.4 % ophthalmic solution Place 1 drop into both eyes as needed for dry eyes. 10/18/2020: 10/18/20: Patient reports uses OTC brand, uses occasionally  . solifenacin (VESICARE) 5 MG tablet Take 1 tablet (5 mg total) by mouth daily.   . tamsulosin (FLOMAX) 0.4 MG CAPS capsule TAKE 1 CAPSULE (0.4 MG TOTAL) BY MOUTH DAILY. (Patient taking differently: Take 0.4 mg by mouth daily.)   . acetaminophen (TYLENOL) 500 MG tablet Take 2 tablets (  1,000 mg total) by mouth every 6 (six) hours. (Patient not taking: No sig reported)   . amLODipine (NORVASC) 5 MG tablet Take 1 tablet (5 mg total) by mouth daily. (Patient not taking: Reported on 10/18/2020) 10/18/2020: 10/18/20: Patient reports this medication was recently discontinued by renal provider   . Continuous Blood Gluc Receiver (FREESTYLE LIBRE 14 DAY READER) DEVI 1 Act by Does not apply route daily. (Patient not taking: Reported  on 10/18/2020)   . Continuous Blood Gluc Sensor (FREESTYLE LIBRE 2 SENSOR) MISC 1 Act by Does not apply route daily. (Patient not taking: Reported on 10/18/2020)   . diltiazem (TIAZAC) 120 MG 24 hr capsule Take 120 mg by mouth daily. (Patient not taking: Reported on 10/18/2020) 10/18/2020: 10/18/20: Patient reports this medication was recently discontinued by renal provider   . docusate sodium (COLACE) 100 MG capsule Take 1 capsule (100 mg total) by mouth 2 (two) times daily. (Patient not taking: Reported on 10/18/2020)   . hydrOXYzine (ATARAX/VISTARIL) 25 MG tablet Take 25 mg by mouth 3 (three) times daily as needed for anxiety, itching or nausea.  (Patient not taking: Reported on 10/18/2020) 10/18/2020: 10/18/20: Patient reports not taking medication   . indapamide (LOZOL) 1.25 MG tablet TAKE 1 TABLET (1.25 MG TOTAL) BY MOUTH DAILY. (Patient not taking: Reported on 10/18/2020) 10/18/2020: 10/18/20: Patient reports this medication was recently discontinued by renal provider  . lisinopril (ZESTRIL) 40 MG tablet TAKE ONE-HALF TABLET BY MOUTH EVERY DAY FOR BLOOD PRESSURE (Patient not taking: Reported on 10/18/2020)   . ondansetron (ZOFRAN ODT) 4 MG disintegrating tablet 2m ODT q4 hours prn nausea/vomit (Patient not taking: Reported on 10/18/2020)   . Oxymetazoline HCl (VICKS SINEX NA) Place 1 spray into the nose as needed (congestion). (Patient not taking: Reported on 10/18/2020)   . traMADol (ULTRAM) 50 MG tablet Take 50-100 mg by mouth every 6 (six) hours as needed for pain. (Patient not taking: Reported on 10/18/2020)    No facility-administered encounter medications on file as of 10/18/2020.    Patient Active Problem List   Diagnosis Date Noted  . Vertigo 09/23/2020  . Encounter for general adult medical examination with abnormal findings 05/20/2020  . Tinnitus aurium, bilateral 05/20/2020  . Metastatic renal cell carcinoma ([place] 02/26/2020  . Diabetic polyneuropathy associated with type 2 diabetes mellitus ([place]  12/18/2019  . CKD (chronic kidney disease) stage 3, GFR 30-59 ml/min (HCC) 10/08/2018  . Murmur, cardiac 10/08/2018  . DDD (degenerative disc disease), cervical 10/08/2018  . OAB (overactive bladder) 04/04/2018  . Seasonal allergic rhinitis due to pollen 11/01/2016  . Microalbuminuria due to type 2 diabetes mellitus ([place] 07/27/2016  . Renal cell carcinoma ([place] 10/06/2015  . Varicose veins of both lower extremities 08/06/2015  . Varicose veins of lower extremities with ulcer ([place] 06/16/2015  . Internal hemorrhoid, bleeding 08/03/2014  . Post traumatic stress disorder (PTSD) 01/29/2014  . Deficiency anemia 12/10/2013  . Radiation proctitis 06/04/2012  . Spinal stenosis of lumbar region with radiculopathy 10/30/2011    Class: Chronic  . OSA (obstructive sleep apnea) 11/24/2010  . Obesity, Class I, BMI 30-34.9 11/24/2010  . Routine general medical examination at a health care facility 11/24/2010  . ADENOCARCINOMA, PROSTATE, GLEASON GRADE 6 12/21/2009  . Type II diabetes mellitus with manifestations ([place] 03/16/2007  . Hyperlipidemia with target LDL less than 100 03/16/2007  . Essential hypertension 03/16/2007  . GERD 03/16/2007    Conditions to be addressed/monitored:HTN and DMII  Care Plan : Diabetes Type 2 (Adult)  Updates made by Knox Royalty, RN since 10/18/2020 12:00 AM    Problem: Glycemic Management (Diabetes, Type 2)   Priority: Medium    Long-Range Goal: Glycemic Management Optimized   Start Date: 08/02/2020  Expected End Date: 02/02/2021  This Visit's Progress: On track  Recent Progress: On track  Priority: Medium  Note:   Objective:  Lab Results  Component Value Date   HGBA1C 7.6 (H) 07/12/2020 .   Lab Results  Component Value Date   CREATININE 3.26 (H) 07/22/2020   CREATININE 3.49 (H) 07/21/2020   CREATININE 3.54 (H) 07/12/2020 .   Marland Kitchen No results found for: EGFR Current Barriers:  Marland Kitchen Knowledge Deficits related to basic Diabetes pathophysiology and self  care/management . Self-care deficits: none identified Case Manager Clinical Goal(s):  Over the next 6 months, patient will: demonstrate improved adherence to prescribed treatment plan for diabetes self care/management as evidenced by:  . daily monitoring and recording of CBG   . adherence to ADA/ carb modified diet  . contacting provider for new or worsened symptoms or questions Interventions:  . Collaboration with [name], MD regarding development and update of comprehensive plan of care as evidenced by provider attestation and co-signature . Inter-disciplinary care team collaboration (see longitudinal plan of care) . Chart reviewed including relevant office notes, upcoming scheduled appointments, and lab results . Discussed current  clinical condition with patient and confirmed no current clinical or medication concerns around Diabetes-- reviewed with patient recent blood sugars at home and he reports general fasting ranges consistently between 98-110; reports today at "95;" reports mainly monitoring blood sugars fasting q am . Discussed use of insulin: patient continues to report only using for blood sugars > 200; reports sing between 30-35 units each time, as instructed-- reports has not needed/ has not taken recently . Continues to report vertigo "slightly better;" able to manage ADL's/ iADL's independently- he denies recent falls post- last outreach on 10/04/20; continues using cane as/ if needed; has resumed driving self to appointments/ errands; confirms has scheduled appointment with [facility] 11/01/20 . Reviewed recent renal provider office visits with patient: he reported 10/04/20 that renal provider "changed a lot of my medications;" medication review completed today: no concerns or discrepancies identified; medication list in EHR updated according to patient report; patient continues to self- manage medications and uses pill box at home  . Confirmed patient has  attended all radiation sessions tolerated well; sessions completed for now . Confirmed patient continues to follow low-carb/ DM diet, "as much as possible;"  . Reviewed upcoming provider appointments with patient and confirmed that patient has plans to attend all as scheduled: [facility] 11/01/20; renal provider 11/11/20; and follow up call from radiation oncology 11/10/20 Self-Care Activities . Self administers oral medications and insulin as prescribed . Attends all scheduled provider appointments . Checks blood sugars as prescribed  . Attempts to adhere to prescribed ADA/carb modified-- will need ongoing education/ reinforcement of strategies for adherence Patient Goals: Marland Kitchen Great job keeping up with checking blood sugars at home: continue writing the values down in your notebook: keep doing this and will review during each of our telephone calls- your blood sugars today look great! . Check blood sugar if I feel it is too high or too low . Enter blood sugar readings and use of insulin into daily log . Take the blood sugar log to all doctor visits- this will help your doctor know which medications you should be on and  how your medications are working  . Call your doctor or care team if you develop concerns or questions about your blood sugar readings Follow Up Plan:  . Telephone follow up appointment with care management team member scheduled for: Monday November 22, 2020 at 10:30 am . The patient has been provided with contact information for the care management team and has been advised to call with any health related questions or concerns.     Care Plan : Hypertension (Adult)  Updates made by Knox Royalty, RN since 10/18/2020 12:00 AM    Problem: Hypertension (Hypertension)   Priority: Medium    Long-Range Goal: Hypertension Monitored   Start Date: 08/02/2020  Expected End Date: 02/02/2021  This Visit's Progress: On track  Recent Progress: On track  Priority: Medium  Note:   Objective:   . Last practice recorded BP readings:  BP Readings from Last 3 Encounters:  07/22/20 (!) 154/92  07/12/20 (!) 160/89  05/20/20 (!) 152/78   . Most recent eGFR/CrCl: No results found for: EGFR  No components found for: CRCL Current Barriers:  Marland Kitchen Knowledge Deficits related to basic understanding of hypertension pathophysiology and self care management . Self care deficits: none identified today; barriers include challenges of preparing meals at home for one person (lives alone); multiple chronic conditions to manage, including diabetes and cancer Case Manager Clinical Goal(s):  Over the next 6 months, patient will demonstrate improved adherence to prescribed treatment plan for hypertension as evidenced by  . taking all medications as prescribed . monitoring and recording blood pressure at home twice weekly . adhering to low sodium/DASH diet Interventions:  . Collaboration with [name], MD regarding development and update of comprehensive plan of care as evidenced by provider attestation and co-signature . Inter-disciplinary care team collaboration (see longitudinal plan of care) . Evaluation of current treatment plan related to hypertension self management and patient's adherence to plan as established by provider . Chart reviewed including relevant office notes, upcoming scheduled appointments, and lab results . Discussed current  clinical condition with patient and confirmed he believes that his previously reported vertigo is "somewhat/ slightly better;"  . Confirmed patient continues to monitor/ record blood pressures at home- reviewed with patient today-- reports blood pressure today of 139/87; with highest reading this week of 160/88: patient reports he believes his blood pressures "have been running real good" at home . Full medication review completed today: patient reports renal provider has discontinued his blood pressure medications: medication list in EHR updated according to  patient report today . Encouraged patient to continue recording blood pressures at home for ongoing review- he verbalizes understanding/ agreement . Confirmed patient continues to endorse following low-sodium diet: positive reinforcement provided . Reviewed upcoming provider appointments with patient and confirmed that patient has plans to attend all as scheduled . Discussed plans with patient for ongoing care management follow up and provided patient with direct contact information for care management team: encouraged patient to contact RN CM sooner than scheduled appointment for any worsening of condition or concerns/ questions Self-Care Activities: . Self administers medications as prescribed . Attends all scheduled provider appointments . Calls provider office for new concerns/ questions . Agrees to monitor/ record BP twice weekly for now . Attempts to follow a low sodium diet/DASH diet: will need ongoing reinforcement/ education Patient Goals: Marland Kitchen Great job keeping up with your blood pressures at home: keep up the great work . Continue monitoring/ recording blood pressures at home several times weekly: please  increase your blood pressure monitoring on the days you feel dizzy: low blood pressure can cause you to fall . Contact your PCP office if you have consistently low or high blood pressures . Since you have been feeling dizzy recently- please take extra effort to prevent future falls at home: keep using your cane as/ if needed; if you have any falls, please let your doctor's know . Write blood pressure results in a log or diary- we will review your blood pressures at home during our future phone calls  . Continue your efforts to limit salt in your diet Follow Up Plan:  . Telephone follow up appointment with care management team member scheduled for: Monday, November 22, 2020 at 10:30 am . The patient has been provided with contact information for the care management team and has been advised to  call with any health related questions or concerns.       Plan:  Telephone follow up appointment with care management team member scheduled for:  Monday, November 22, 2020 at 10:30 am  The patient has been provided with contact information for the care management team and has been advised to call with any health related questions or concerns  Oneta Rack, RN, BSN, [place] [phone]: direct office [phone]: mobile

## 2020-10-18 NOTE — Patient Instructions (Signed)
Visit Information  it was nice talking with you today.   Please read over the attached information, and keep up the great work checking your blood pressures, blood sugars, and managing your medications at home   I look forward to talking to you again for an update on Monday, November 22, 2020 at 10:30 am - please be listening out for my call that day.  I will call as close to 10:30 am as possible; I look forward to hearing about your progress.   Please don't hesitate to contact me if I can be of assistance to you before our next scheduled appointment.   Oneta Rack, RN, BSN, [facility] RN Care Coordination- [place] [phone]: direct office [phone]: mobile    PATIENT GOALS: Goals Addressed            This Visit's Progress   . Monitor and Manage My Blood Sugar-Diabetes Type 2   On track    Timeframe:  Long-Range Goal Priority:  Medium Start Date:            08/02/20                 Expected End Date:  02/02/21                     Follow Up Date 11/22/20   . Great job keeping up with checking blood sugars at home: continue writing the values down in your notebook: keep doing this and will review during each of our telephone calls- your blood sugars today look great! . Check blood sugar if I feel it is too high or too low . Enter blood sugar readings and use of insulin into daily log . Take the blood sugar log to all doctor visits- this will help your doctor know which medications you should be on and how your medications are working  . Call your doctor or care team if you develop concerns or questions about your blood sugar readings   Why is this important?    Checking your blood sugar at home helps to keep it from getting very high or very low.   Writing the results in a diary or log helps the doctor know how to care for you.   Your blood sugar log should have the time, date and the results.   Also, write down the amount of insulin or  other medicine that you take.   Other information, like what you ate, exercise done and how you were feeling, will also be helpful.         . Track and Manage My Blood Pressure-Hypertension   On track    Timeframe:  Long-Range Goal Priority:  Medium Start Date:            08/02/20                 Expected End Date:         02/02/21              Follow Up Date 11/22/20   . Great job keeping up with your blood pressures at home: keep up the great work . Continue monitoring/ recording blood pressures at home several times weekly: please increase your blood pressure monitoring on the days you feel dizzy: low blood pressure can cause you to fall . Contact your PCP office if you have consistently low or high blood pressures . Since you have been feeling dizzy  recently- please take extra effort to prevent future falls at home: keep using your cane as/ if needed; if you have any falls, please let your doctor's know . Write blood pressure results in a log or diary- we will review your blood pressures at home during our future phone calls  . Continue your efforts to limit salt in your diet   Why is this important?    You won't feel high blood pressure, but it can still hurt your blood vessels.   High blood pressure can cause heart or kidney problems. It can also cause a stroke.   Making lifestyle changes like losing a little weight or eating less salt will help.   Checking your blood pressure at home and at different times of the day can help to control blood pressure.   If the doctor prescribes medicine remember to take it the way the doctor ordered.   Call the office if you cannot afford the medicine or if there are questions about it.            Patient verbalizes understanding of instructions provided today and agrees to view in [place].   Telephone follow up appointment with care management team member scheduled for:  Monday, November 22, 2020 at 10:30 am  The patient has been  provided with contact information for the care management team and has been advised to call with any health related questions or concerns.   Oneta Rack, RN, BSN, [facility] RN Care Coordination- [place] [phone]: direct office [phone]: mobile

## 2020-10-21 DIAGNOSIS — C641 Malignant neoplasm of right kidney, except renal pelvis: Secondary | ICD-10-CM | POA: Diagnosis not present

## 2020-11-01 ENCOUNTER — Other Ambulatory Visit: Payer: Self-pay

## 2020-11-01 ENCOUNTER — Encounter: Payer: Self-pay | Admitting: Physical Therapy

## 2020-11-01 ENCOUNTER — Ambulatory Visit: Payer: Medicare Other | Attending: Internal Medicine | Admitting: Physical Therapy

## 2020-11-01 DIAGNOSIS — H8111 Benign paroxysmal vertigo, right ear: Secondary | ICD-10-CM

## 2020-11-01 NOTE — Patient Instructions (Signed)
Sit to Side-Lying    Sit on edge of bed. 1. Turn head 45 to right. 2. Maintain head position and lie down slowly on left side. Hold until symptoms subside. 3. Sit up slowly. Hold until symptoms subside. 4. Turn head 45 to left. 5. Maintain head position and lie down slowly on right side. Hold until symptoms subside. 6. Sit up slowly. Repeat sequence _5___ times per session. Do __3__ sessions per day.  Copyright  VHI. All rights reserved.   

## 2020-11-02 NOTE — Therapy (Signed)
[facility] [address] [place], [place], [address] Phone: [phone]   Fax:  [phone]  Physical Therapy Evaluation  Patient Details  Name: Antonio Adams MRN: [id number] Date of Birth: Jun 24, 1945 Referring Provider (PT): Scarlette Calico, MD   Encounter Date: 11/01/2020   PT End of Session - 11/02/20 1910     Visit Number 1    Number of Visits 2    Authorization Type [employer] Medicare    Authorization Time Period 11-01-20 - 12-01-20    PT Start Time 1105    PT Stop Time 1150    PT Time Calculation (min) 45 min    Activity Tolerance Patient tolerated treatment well    Behavior During Therapy [facility] for tasks assessed/performed             Past Medical History:  Diagnosis Date   ADENOCARCINOMA, PROSTATE, GLEASON GRADE 6 12/21/2009   prostate cancer   ALLERGIC RHINITIS 03/16/2007   Allergy    Anxiety    Arthritis    back    BACK PAIN WITH RADICULOPATHY 11/23/2008   Cancer of kidney ([place])    partial right kidney removed   CARPAL TUNNEL SYNDROME, BILATERAL 03/16/2007   Chronic back pain    Constipation    takes Senokot daily   Depression    occasionally   DIABETES MELLITUS, TYPE II 03/16/2007   takes Januvia and MEtformin daily   GASTROINTESTINAL HEMORRHAGE, HX OF 03/16/2007   GERD 03/16/2007   takes Omeprazole daily   Glaucoma    mild - no eye drops   Hemorrhoids    History of colon polyps    HYPERLIPIDEMIA 03/16/2007   takes Crestor daily   HYPERTENSION 03/16/2007   takes Diltiazem and Lisinopril daily    Kidney cancer, primary, with metastasis from kidney to other site [facility])    LEG CRAMPS 05/22/2007   Nocturia    Overactive bladder    PARESTHESIA 05/21/2007   Proctitis    PTSD (post-traumatic stress disorder)    wakes up may be dreaming of fighting   Rectal bleeding    Dr.Norins has explained its from the Radiation that he has received   Rectal bleeding    Renal cell carcinoma ([place])    Sleep apnea     uses CPAP nightly   Stomach cancer ([place])    Urinary frequency    takes Toviaz daily    Past Surgical History:  Procedure Laterality Date   ADRENALECTOMY Right 02/26/2020   Procedure: OPEN RIGHT RENAL CELL METASTATECTOMY AND  ADRENALECTOMY;  Surgeon: [name], MD;  Location: [facility];  Service: Urology;  Laterality: Right;   [place], 2008   repeat surgery; ESI '08   [place]     bilateral   CERVICAL FUSION     COLONOSCOPY     colonosocpy     ESOPHAGOGASTRODUODENOSCOPY     ED   FLEXIBLE SIGMOIDOSCOPY N/A 01/08/2017   Procedure: FLEXIBLE SIGMOIDOSCOPY;  Surgeon: Irene Shipper, MD;  Location: [place];  Service: Endoscopy;  Laterality: N/A;   HOT HEMOSTASIS N/A 01/08/2017   Procedure: HOT HEMOSTASIS (ARGON PLASMA COAGULATION/BICAP);  Surgeon: Irene Shipper, MD;  Location: [facility] END[facility]COPY;  Service: Endoscopy;  Laterality: N/A;   IR RADIOLOGIST EVAL & MGMT  12/17/2019   IR RADIOLOGIST EVAL & MGMT  07/01/2020   IR RADIOLOGIST EVAL & MGMT  08/25/2020   LUMBAR LAMINECTOMY  10/30/2011   Procedure: MICRODISCECTOMY LUMBAR LAMINECTOMY;  Surgeon:  Jessy Oto, MD;  Location: [facility];  Service: Orthopedics;  Laterality: Right;  Right L4-5 and L5-S1 Microdiscectomy   LUMBAR LAMINECTOMY  06/17/2012   Procedure: MICRODISCECTOMY LUMBAR LAMINECTOMY;  Surgeon: Jessy Oto, MD;  Location: [facility];  Service: Orthopedics;  Laterality: N/A;  Right L5-S1 microdiscectomy   OPEN PARTIAL HEPATECTOMY  N/A 02/26/2020   Procedure: OPEN PARTIAL HEPATECTOMY, CHOLECYSTECTOMY;  Surgeon: Stark Klein, MD;  Location: [facility];  Service: General;  Laterality: N/A;   POLYPECTOMY     PROSTATE SURGERY  march 2012   seed implant   RADIOFREQUENCY ABLATION N/A 07/21/2020   Procedure: CT MICROWAVE ABLATION;  Surgeon: Criselda Peaches, MD;  Location: [facility];  Service: Anesthesiology;  Laterality: N/A;   ROBOTIC ASSITED PARTIAL NEPHRECTOMY Right 09/08/2015   Procedure: XI ROBOTIC ASSITED RIGHT  PARTIAL NEPHRECTOMY;  Surgeon: [name], MD;  Location[facility];  Service: Urology;  Laterality: Right;  Clamp on: 1033 Clamp off: 1103 Total Clamp Time: 30 minutes   SHOULDER ARTHROSCOPY WITH SUBACROMIAL DECOMPRESSION Left 01/20/2016   Procedure: LEFT SHOULDER ARTHROSCOPY WITH EXTENSIVE  DEBRIDEMENT, ACROMIOPLASTY;  Surgeon: Ninetta Lights, MD;  Location: [facility];  Service: Orthopedics;  Laterality: Left;   SHOULDER SURGERY  LFT   Stress Cardiolite  12/24/2003   negative for ischemia   UPPER ESOPHAGEAL ENDOSCOPIC ULTRASOUND (EUS)  04/18/2016   [facility]   UPPER GASTROINTESTINAL ENDOSCOPY     WOUND EXPLORATION     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        [facility] PT Assessment - 11/02/20 0001       Assessment   Medical Diagnosis BPPV    Referring Provider (PT) Scarlette Calico, MD    Onset Date/Surgical Date 09/13/20      Precautions   Precautions None      Balance Screen   Has the patient fallen in the past 6 months Yes    How many times? 2   pt states he fell twice after onset of vertigo   Has the patient had a decrease in activity level because of a fear of falling?  No    Is the patient reluctant to leave their home because of a fear of falling?  No                    Vestibular Assessment - 11/02/20 0001       Symptom Behavior   Type of Dizziness  Spinning    Frequency of Dizziness varies - depends on the movement    Duration of Dizziness minutes to secs    Symptom Nature Positional    Aggravating Factors Sitting with head tilted back;Looking up to the ceiling;Rolling to right    Relieving Factors Head stationary    Progression of Symptoms Better      Positional Testing   Dix-Hallpike Dix-Hallpike Right;Dix-Hallpike Left      Dix-Hallpike Right   Dix-Hallpike Right Duration approx. 15 secs    Dix-Hallpike Right Symptoms Upbeat, right rotatory nystagmus       Dix-Hallpike Left   Dix-Hallpike Left Duration none    Dix-Hallpike Left Symptoms No nystagmus                Objective measurements completed on examination: See above findings.               PT Education - 11/02/20 1906  Education Details pt given article on BPPV etiology from [place] and instructed in [place].    Person(s) Educated Patient    Methods Explanation;Demonstration;Handout    Comprehension Verbalized understanding;Returned demonstration                 PT Long Term Goals - 11/02/20 1916       PT LONG TERM GOAL #1   Title Pt will have (-) Rt Dix-Hallpike test to indicate resolution of Rt BPPV.    Time 3    Period Weeks    Status New    Target Date 11/19/20      PT LONG TERM GOAL #2   Title Independent in HEP for [employer] exercises for self treatment prn.    Time 3    Period Weeks    Status New    Target Date 11/19/20                    Plan - 11/02/20 1912     Clinical Impression Statement Pt has (+) Rt Dix-Hallpike test with Rt rotary upbeating nystagmus and c/o vertigo in test position, indicative of Rt BPPV.  Pt will benefit from PT for treatment with Epley manuever for canalith repositioning.    Personal Factors and Comorbidities Comorbidity 1;Time since onset of injury/illness/exacerbation    Examination-Activity Limitations Locomotion Level;Transfers;Bed Mobility;Reach Overhead    Examination-Participation Restrictions Cleaning;Meal Prep;Shop;Laundry;Yard Work    Stability/Clinical Decision Making Stable/Uncomplicated    Clinical Decision Making Low    Rehab Potential Good    PT Frequency 1x / week    PT Duration 2 weeks    PT Treatment/Interventions ADLs/Self Care Home Management;Canalith Repostioning;Balance training;Neuromuscular re-education;Patient/family education;Therapeutic activities;Therapeutic exercise    PT Next Visit Plan recheck Rt BPPV    PT Home Exercise Plan [place]  exs.    Consulted and Agree with Plan of Care Patient             Patient will benefit from skilled therapeutic intervention in order to improve the following deficits and impairments:  Dizziness, Decreased balance  Visit Diagnosis: BPPV (benign paroxysmal positional vertigo), right - Plan: PT plan of care cert/re-cert     Problem List Patient Active Problem List   Diagnosis Date Noted   Vertigo 09/23/2020   Encounter for general adult medical examination with abnormal findings 05/20/2020   Tinnitus aurium, bilateral 05/20/2020   Metastatic renal cell carcinoma ([place]) 02/26/2020   Diabetic polyneuropathy associated with type 2 diabetes mellitus ([place]) 12/18/2019   CKD (chronic kidney disease) stage 3, GFR 30-59 ml/min ([place]) 10/08/2018   Murmur, cardiac 10/08/2018   DDD (degenerative disc disease), cervical 10/08/2018   OAB (overactive bladder) 04/04/2018   Seasonal allergic rhinitis due to pollen 11/01/2016   Microalbuminuria due to type 2 diabetes mellitus ([place]) 07/27/2016   Renal cell carcinoma ([place]) 10/06/2015   Varicose veins of both lower extremities 08/06/2015   Varicose veins of lower extremities with ulcer ([place]) 06/16/2015   Internal hemorrhoid, bleeding 08/03/2014   Post traumatic stress disorder (PTSD) 01/29/2014   Deficiency anemia 12/10/2013   Radiation proctitis 06/04/2012   Spinal stenosis of lumbar region with radiculopathy 10/30/2011    Class: Chronic   OSA (obstructive sleep apnea) 11/24/2010   Obesity, Class I, BMI 30-34.9 11/24/2010   Routine general medical examination at a health care facility 11/24/2010   ADENOCARCINOMA, PROSTATE, GLEASON GRADE 6 12/21/2009   Type II diabetes mellitus with manifestations ([place]) 03/16/2007   Hyperlipidemia with target  LDL less than 100 03/16/2007   Essential hypertension 03/16/2007   GERD 03/16/2007    Alda Lea, PT 11/02/2020, 7:22 PM  [facility] [address] [place], [place], [address] Phone: [phone]   Fax:  [phone]  Name: Antonio Adams MRN: [id number] Date of Birth: Apr 23, 1946

## 2020-11-08 ENCOUNTER — Other Ambulatory Visit: Payer: Self-pay | Admitting: Interventional Radiology

## 2020-11-08 DIAGNOSIS — K769 Liver disease, unspecified: Secondary | ICD-10-CM

## 2020-11-09 ENCOUNTER — Encounter: Payer: Self-pay | Admitting: Hematology

## 2020-11-09 NOTE — Progress Notes (Signed)
Patients meaningful use is complete. Patient is  aware that this is a phone visit. Patient denies any issues with his bowels. Patient denies any fatigue. Patient denies any n/v or issues with his skin at his radiation site.Patient reports nocturia x2. Patient reports urgency sometime.

## 2020-11-10 ENCOUNTER — Other Ambulatory Visit: Payer: Self-pay

## 2020-11-10 ENCOUNTER — Ambulatory Visit
Admission: RE | Admit: 2020-11-10 | Discharge: 2020-11-10 | Disposition: A | Discharge status: Home / Self Care | Payer: Medicare Other | Source: Ambulatory Visit | Attending: Urology | Admitting: Urology

## 2020-11-10 DIAGNOSIS — C641 Malignant neoplasm of right kidney, except renal pelvis: Secondary | ICD-10-CM

## 2020-11-10 NOTE — Progress Notes (Signed)
  Radiation Oncology         [phone]) [phone] ________________________________  Name: Antonio Adams MRN: [id number]  Date: 10/07/2020  DOB: February 28, 1946  End of Treatment Note  Diagnosis:   74 y.o. man with metastatic renal cell carcinoma involving the liver.     Indication for treatment:  Curative, Definitive SBRT       Radiation treatment dates:   09/27/20 - 10/07/20  Site/dose:   The 1.3 cm lesion in the posterior medial liver capsule and 2 cm lesion along the abdominal surface of the diaphragm were treated to 50 Gy in 5 fractions of 10 Gy  Beams/energy:   The patient was treated using stereotactic body radiotherapy according to a 3D conformal radiotherapy plan.  Volumetric arc fields were employed to deliver 6 MV X-rays.  Image guidance was performed with per fraction cone beam CT prior to treatment under personal MD supervision.  Immobilization was achieved using BodyFix Pillow.  Narrative: The patient tolerated radiation treatment relatively well without any significant side effects.  He did report some constipation and mild fatigue but otherwise, was without complaint.  Plan: The patient has completed radiation treatment. The patient will return to radiation oncology clinic for routine followup in one month. I advised them to call or return sooner if they have any questions or concerns related to their recovery or treatment. ________________________________  Sheral Apley. [name], M.D.

## 2020-11-10 NOTE — Progress Notes (Signed)
Radiation Oncology         ([phone]) [phone] ________________________________  Name: Antonio Adams MRN: [id number]  Date: 11/10/2020  DOB: 1945-08-14  Post Treatment Note  CC: [name], MD  [name], MD  Diagnosis:   75 y.o. man with metastatic renal cell carcinoma involving the liver.    Interval Since Last Radiation:  5 weeks  09/27/20 - 10/07/20: The 1.3 cm lesion in the posterior medial liver capsule and 2 cm lesion along the abdominal surface of the diaphragm were treated to 50 Gy in 5 fractions of 10 Gy  2012: Prostate: brachytherapy boost (07/31/10) followed by 5 weeks external beam radiotherapy to the prostate and pelvic nodes [name])- completed 09/2010.  Narrative:  I spoke with the patient to conduct his routine scheduled 1 month follow up visit via telephone to spare the patient unnecessary potential exposure in the healthcare setting during the current COVID-19 pandemic.  The patient was notified in advance and gave permission to proceed with this visit format. He tolerated radiation treatment relatively well without any significant side effects.  He did report some constipation and mild fatigue but otherwise, was without complaint.                              On review of systems, the patient states that he is doing well in general.  His only complaint is of some persistent vertigo which is now only occurring when he turns his head to the right.  He has seen physical therapy and has a follow-up visit with them on 11/18/2020 regarding this.  Otherwise, he denies any nausea, vomiting, diarrhea, constipation or abdominal pain.  He reports that his appetite is improving and he is maintaining his weight.  He is scheduled to follow-up with Dr. Louis Meckel for labs on 12/24/2020 and will see him in the office on 12/31/2020.  Overall, he is pleased with his progress to date.  ALLERGIES:  is allergic to ace inhibitors and lisinopril.  Meds: Current Outpatient Medications   Medication Sig Dispense Refill   acetaminophen (TYLENOL) 500 MG tablet Take 2 tablets (1,000 mg total) by mouth every 6 (six) hours. 60 tablet 0   aspirin EC 81 MG tablet Take 81 mg by mouth daily. Swallow whole.     atorvastatin (LIPITOR) 40 MG tablet Take 1 tablet (40 mg total) by mouth daily. 90 tablet 1   cholecalciferol (VITAMIN D) 1000 units tablet Take 1,000 Units by mouth daily.     Continuous Blood Gluc Receiver (FREESTYLE LIBRE 14 DAY READER) DEVI 1 Act by Does not apply route daily. 2 each 5   Continuous Blood Gluc Sensor (FREESTYLE LIBRE 2 SENSOR) MISC 1 Act by Does not apply route daily. 2 each 5   dimenhyDRINATE (DRAMAMINE) 50 MG tablet Take 50 mg by mouth every 8 (eight) hours as needed for dizziness (vertigo).     gabapentin (NEURONTIN) 100 MG capsule Take 100 mg by mouth at bedtime.     gemfibrozil (LOPID) 600 MG tablet Take 600 mg by mouth 2 (two) times daily before a meal.     Insulin Pen Needle 32G X 6 MM MISC 1 Act by Does not apply route daily. 100 each 1   meclizine (ANTIVERT) 25 MG tablet Take 1 tablet (25 mg total) by mouth 3 (three) times daily as needed for dizziness. 20 tablet 0   Omega-3 Fatty Acids (FISH OIL PO) Take 1 tablet by  mouth daily.     ONE TOUCH ULTRA TEST test strip USE 2 (TWO) TIMES DAILY. AND LANCETS 2/DAY 250.03 100 each 11   OVER THE COUNTER MEDICATION Take 1 tablet by mouth 2 (two) times daily. Focus factor     polyvinyl alcohol (LIQUIFILM TEARS) 1.4 % ophthalmic solution Place 1 drop into both eyes as needed for dry eyes.     solifenacin (VESICARE) 5 MG tablet Take 1 tablet (5 mg total) by mouth daily. 90 tablet 1   tamsulosin (FLOMAX) 0.4 MG CAPS capsule TAKE 1 CAPSULE (0.4 MG TOTAL) BY MOUTH DAILY. (Patient taking differently: Take 0.4 mg by mouth daily.) 30 capsule 1   tamsulosin (FLOMAX) 0.4 MG CAPS capsule Take by mouth.     amLODipine (NORVASC) 5 MG tablet Take 1 tablet (5 mg total) by mouth daily. (Patient not taking: Reported on 11/09/2020)  90 tablet 1   diltiazem (TIAZAC) 120 MG 24 hr capsule Take 120 mg by mouth daily. (Patient not taking: No sig reported)     docusate sodium (COLACE) 100 MG capsule Take 1 capsule (100 mg total) by mouth 2 (two) times daily. (Patient not taking: No sig reported) 60 capsule 0   ferrous sulfate 325 (65 FE) MG tablet Take 1 tablet (325 mg total) by mouth 2 (two) times daily. Patient needs office visit for further refills (Patient not taking: Reported on 11/09/2020) 60 tablet 1   hydrOXYzine (ATARAX/VISTARIL) 25 MG tablet Take 25 mg by mouth 3 (three) times daily as needed for anxiety, itching or nausea.  (Patient not taking: No sig reported)     indapamide (LOZOL) 1.25 MG tablet TAKE 1 TABLET (1.25 MG TOTAL) BY MOUTH DAILY. (Patient not taking: No sig reported) 90 tablet 0   insulin glargine (LANTUS SOLOSTAR) 100 UNIT/ML Solostar Pen Inject 30 Units into the skin daily as needed (blood sugar over 200). (Patient not taking: Reported on 11/09/2020) 9 mL 1   lisinopril (ZESTRIL) 40 MG tablet TAKE ONE-HALF TABLET BY MOUTH EVERY DAY FOR BLOOD PRESSURE (Patient not taking: No sig reported)     ondansetron (ZOFRAN ODT) 4 MG disintegrating tablet 4mg  ODT q4 hours prn nausea/vomit (Patient not taking: No sig reported) 12 tablet 0   Oxymetazoline HCl (VICKS SINEX NA) Place 1 spray into the nose as needed (congestion). (Patient not taking: No sig reported)     traMADol (ULTRAM) 50 MG tablet Take 50-100 mg by mouth every 6 (six) hours as needed for pain. (Patient not taking: No sig reported)     No current facility-administered medications for this encounter.    Physical Findings:  vitals were not taken for this visit.   /Unable to assess due to telephone follow-up visit format.  Lab Findings: Lab Results  Component Value Date   WBC 3.4 (L) 09/13/2020   HGB 8.9 (L) 09/13/2020   HCT 27.2 (L) 09/13/2020   MCV 88.3 09/13/2020   PLT 189 09/13/2020     Radiographic Findings: No results  found.  Impression/Plan: 75. 75 y.o. man with metastatic renal cell carcinoma involving the liver.   He appears to have recovered well from the effects of his recent SBRT treatments and is currently without complaints.  We discussed that while we are happy to continue to participate in his care if clinically indicated, at this point, we will plan to see him back on an as-needed basis.  He will continue in routine follow-up under the care and direction of Dr. Louis Meckel and we will look forward to  continuing to follow his progress via correspondence.  He knows that he is welcome to call at anytime with any questions or concerns related to his previous radiation.    Nicholos Johns, PA-C

## 2020-11-11 DIAGNOSIS — E785 Hyperlipidemia, unspecified: Secondary | ICD-10-CM | POA: Diagnosis not present

## 2020-11-11 DIAGNOSIS — C649 Malignant neoplasm of unspecified kidney, except renal pelvis: Secondary | ICD-10-CM | POA: Diagnosis not present

## 2020-11-11 DIAGNOSIS — N184 Chronic kidney disease, stage 4 (severe): Secondary | ICD-10-CM | POA: Diagnosis not present

## 2020-11-11 DIAGNOSIS — I129 Hypertensive chronic kidney disease with stage 1 through stage 4 chronic kidney disease, or unspecified chronic kidney disease: Secondary | ICD-10-CM | POA: Diagnosis not present

## 2020-11-18 ENCOUNTER — Other Ambulatory Visit: Payer: Self-pay

## 2020-11-18 ENCOUNTER — Ambulatory Visit: Payer: Medicare Other | Attending: Internal Medicine | Admitting: Physical Therapy

## 2020-11-18 DIAGNOSIS — H8111 Benign paroxysmal vertigo, right ear: Secondary | ICD-10-CM | POA: Diagnosis not present

## 2020-11-18 NOTE — Patient Instructions (Signed)
Sit to Side-Lying    Sit on edge of bed. 1. Turn head 45 to right. 2. Maintain head position and lie down slowly on left side. Hold until symptoms subside. 3. Sit up slowly. Hold until symptoms subside. 4. Turn head 45 to left. 5. Maintain head position and lie down slowly on right side. Hold until symptoms subside. 6. Sit up slowly. Repeat sequence __5__ times per session. Do __2-3__ sessions per day.  Copyright  VHI. All rights reserved.   

## 2020-11-19 NOTE — Therapy (Signed)
[facility] [address] [place], [place], [address] Phone: [phone]   Fax:  [phone]  Physical Therapy Treatment  Patient Details  Name: Antonio Adams MRN: [id number] Date of Birth: 1946/01/25 Referring Provider (PT): Scarlette Calico, MD   Encounter Date: 11/18/2020   PT End of Session - 11/18/20 1439     Visit Number 2    Number of Visits 2    Authorization Type [employer] Medicare    Authorization Time Period 11-01-20 - 12-01-20    PT Start Time 1316    PT Stop Time 1400    PT Time Calculation (min) 44 min    Activity Tolerance Patient tolerated treatment well    Behavior During Therapy [facility] for tasks assessed/performed             Past Medical History:  Diagnosis Date   ADENOCARCINOMA, PROSTATE, GLEASON GRADE 6 12/21/2009   prostate cancer   ALLERGIC RHINITIS 03/16/2007   Allergy    Anxiety    Arthritis    back    BACK PAIN WITH RADICULOPATHY 11/23/2008   Cancer of kidney ([place])    partial right kidney removed   CARPAL TUNNEL SYNDROME, BILATERAL 03/16/2007   Chronic back pain    Constipation    takes Senokot daily   Depression    occasionally   DIABETES MELLITUS, TYPE II 03/16/2007   takes Januvia and MEtformin daily   GASTROINTESTINAL HEMORRHAGE, HX OF 03/16/2007   GERD 03/16/2007   takes Omeprazole daily   Glaucoma    mild - no eye drops   Hemorrhoids    History of colon polyps    HYPERLIPIDEMIA 03/16/2007   takes Crestor daily   HYPERTENSION 03/16/2007   takes Diltiazem and Lisinopril daily    Kidney cancer, primary, with metastasis from kidney to other site [facility])    LEG CRAMPS 05/22/2007   Nocturia    Overactive bladder    PARESTHESIA 05/21/2007   Proctitis    PTSD (post-traumatic stress disorder)    wakes up may be dreaming of fighting   Rectal bleeding    Dr.Norins has explained its from the Radiation that he has received   Rectal bleeding    Renal cell carcinoma ([place])    Sleep apnea     uses CPAP nightly   Stomach cancer ([place])    Urinary frequency    takes Toviaz daily    Past Surgical History:  Procedure Laterality Date   ADRENALECTOMY Right 02/26/2020   Procedure: OPEN RIGHT RENAL CELL METASTATECTOMY AND  ADRENALECTOMY;  Surgeon: [name], MD;  Location: [facility];  Service: Urology;  Laterality: Right;   [place], 2008   repeat surgery; ESI '08   Parchment     bilateral   CERVICAL FUSION     COLONOSCOPY     colonosocpy     ESOPHAGOGASTRODUODENOSCOPY     ED   FLEXIBLE SIGMOIDOSCOPY N/A 01/08/2017   Procedure: FLEXIBLE SIGMOIDOSCOPY;  Surgeon: Irene Shipper, MD;  Location: [place];  Service: Endoscopy;  Laterality: N/A;   HOT HEMOSTASIS N/A 01/08/2017   Procedure: HOT HEMOSTASIS (ARGON PLASMA COAGULATION/BICAP);  Surgeon: Irene Shipper, MD;  Location[facility];  Service: Endoscopy;  Laterality: N/A;   IR RADIOLOGIST EVAL & MGMT  12/17/2019   IR RADIOLOGIST EVAL & MGMT  07/01/2020   IR RADIOLOGIST EVAL & MGMT  08/25/2020   LUMBAR LAMINECTOMY  10/30/2011   Procedure: MICRODISCECTOMY LUMBAR LAMINECTOMY;  Surgeon:  Jessy Oto, MD;  Location: [place];  Service: Orthopedics;  Laterality: Right;  Right L4-5 and L5-S1 Microdiscectomy   LUMBAR LAMINECTOMY  06/17/2012   Procedure: MICRODISCECTOMY LUMBAR LAMINECTOMY;  Surgeon: Jessy Oto, MD;  Location: [place];  Service: Orthopedics;  Laterality: N/A;  Right L5-S1 microdiscectomy   OPEN PARTIAL HEPATECTOMY  N/A 02/26/2020   Procedure: OPEN PARTIAL HEPATECTOMY, CHOLECYSTECTOMY;  Surgeon: Stark Klein, MD;  Location: [facility];  Service: General;  Laterality: N/A;   POLYPECTOMY     PROSTATE SURGERY  march 2012   seed implant   RADIOFREQUENCY ABLATION N/A 07/21/2020   Procedure: CT MICROWAVE ABLATION;  Surgeon: Criselda Peaches, MD;  Location: [facility];  Service: Anesthesiology;  Laterality: N/A;   ROBOTIC ASSITED PARTIAL NEPHRECTOMY Right 09/08/2015   Procedure: XI ROBOTIC ASSITED RIGHT PARTIAL  NEPHRECTOMY;  Surgeon: [name], MD;  Location[facility];  Service: Urology;  Laterality: Right;  Clamp on: 1033 Clamp off: 1103 Total Clamp Time: 30 minutes   SHOULDER ARTHROSCOPY WITH SUBACROMIAL DECOMPRESSION Left 01/20/2016   Procedure: LEFT SHOULDER ARTHROSCOPY WITH EXTENSIVE  DEBRIDEMENT, ACROMIOPLASTY;  Surgeon: Ninetta Lights, MD;  Location: [place];  Service: Orthopedics;  Laterality: Left;   SHOULDER SURGERY  LFT   Stress Cardiolite  12/24/2003   negative for ischemia   UPPER ESOPHAGEAL ENDOSCOPIC ULTRASOUND (EUS)  04/18/2016   [facility]   UPPER GASTROINTESTINAL ENDOSCOPY     WOUND EXPLORATION     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   Subjective Assessment - 11/18/20 1314     Subjective Pt states the dizziness is better than it was at eval 2 weeks ago but is not fully resolved; feels it when he turns over onto his Rt side    Currently in Pain? No/denies                                Vestibular Treatment/Exercise - 11/19/20 0001       Vestibular Treatment/Exercise   Vestibular Treatment Provided Canalith Repositioning    Canalith Repositioning Epley Manuever Right       EPLEY MANUEVER RIGHT   Number of Reps  4   after initial position on 3rd rep - pt was returned to upright seated, then resumed with Rt Dix-Hallpike position for Epley maneuver   Overall Response Improved Symptoms    Response Details  no nystagmus and no c/o vertigo in any position                   PT Education - 11/18/20 1439     Education Details reissued and reviewed [employer] exs for HEP for Rt BPPV    Person(s) Educated Patient    Methods Explanation;Demonstration;Handout    Comprehension Verbalized understanding                 PT Long Term Goals - 11/19/20 0827       PT LONG TERM GOAL #1   Title Pt will have (-) Rt Dix-Hallpike test to indicate  resolution of Rt BPPV.    Time 3    Period Weeks    Status New      PT LONG TERM GOAL #2   Title Independent in HEP for [employer] exercises for self treatment prn.    Time 3    Period Weeks  Status New                   Plan - 11/19/20 7048     Clinical Impression Statement Pt continues to have (+) Rt Dix-Hallpike test with Rt rotary upbeating nystagmus, indicative of Rt posterior canalithiasis.  Epley was performed for 4 reps, with resolution of nystagmus and vertigo in all positions on 4th rep.  Will continue to assess Rt BPPV.    Personal Factors and Comorbidities Comorbidity 1;Time since onset of injury/illness/exacerbation    Examination-Activity Limitations Locomotion Level;Transfers;Bed Mobility;Reach Overhead    Examination-Participation Restrictions Cleaning;Meal Prep;Shop;Laundry;Yard Work    Stability/Clinical Decision Making Stable/Uncomplicated    Rehab Potential Good    PT Frequency 1x / week    PT Duration 2 weeks    PT Treatment/Interventions ADLs/Self Care Home Management;Canalith Repostioning;Balance training;Neuromuscular re-education;Patient/family education;Therapeutic activities;Therapeutic exercise    PT Next Visit Plan recheck Rt BPPV    PT Home Exercise Plan Brandt-Daroff exs.    Consulted and Agree with Plan of Care Patient             Patient will benefit from skilled therapeutic intervention in order to improve the following deficits and impairments:  Dizziness, Decreased balance  Visit Diagnosis: BPPV (benign paroxysmal positional vertigo), right     Problem List Patient Active Problem List   Diagnosis Date Noted   Vertigo 09/23/2020   Encounter for general adult medical examination with abnormal findings 05/20/2020   Tinnitus aurium, bilateral 05/20/2020   Metastatic renal cell carcinoma ([place]) 02/26/2020   Diabetic polyneuropathy associated with type 2 diabetes mellitus ([place]) 12/18/2019   CKD (chronic kidney disease) stage  3, GFR 30-59 ml/min ([place]) 10/08/2018   Murmur, cardiac 10/08/2018   DDD (degenerative disc disease), cervical 10/08/2018   OAB (overactive bladder) 04/04/2018   Seasonal allergic rhinitis due to pollen 11/01/2016   Microalbuminuria due to type 2 diabetes mellitus ([place]) 07/27/2016   Renal cell carcinoma ([place]) 10/06/2015   Varicose veins of both lower extremities 08/06/2015   Varicose veins of lower extremities with ulcer ([place]) 06/16/2015   Internal hemorrhoid, bleeding 08/03/2014   Post traumatic stress disorder (PTSD) 01/29/2014   Deficiency anemia 12/10/2013   Radiation proctitis 06/04/2012   Spinal stenosis of lumbar region with radiculopathy 10/30/2011    Class: Chronic   OSA (obstructive sleep apnea) 11/24/2010   Obesity, Class I, BMI 30-34.9 11/24/2010   Routine general medical examination at a health care facility 11/24/2010   ADENOCARCINOMA, PROSTATE, GLEASON GRADE 6 12/21/2009   Type II diabetes mellitus with manifestations ([place]) 03/16/2007   Hyperlipidemia with target LDL less than 100 03/16/2007   Essential hypertension 03/16/2007   GERD 03/16/2007    Mililani Murthy, [place], PT 11/19/2020, 8:28 AM  [facility] [address] [place], [place], [address] Phone: [phone]   Fax:  [phone]  Name: MARIUSZ JUBB MRN: [id number] Date of Birth: Mar 29, 1946

## 2020-11-22 ENCOUNTER — Ambulatory Visit (INDEPENDENT_AMBULATORY_CARE_PROVIDER_SITE_OTHER): Payer: Medicare Other | Admitting: *Deleted

## 2020-11-22 DIAGNOSIS — E118 Type 2 diabetes mellitus with unspecified complications: Secondary | ICD-10-CM

## 2020-11-22 DIAGNOSIS — I10 Essential (primary) hypertension: Secondary | ICD-10-CM | POA: Diagnosis not present

## 2020-11-22 NOTE — Patient Instructions (Signed)
Visit [place], it was nice talking with you today.   Keep up the great work keeping your blood sugars and blood pressures at home- we will review these each time we talk over the phone for our telephone appointments   I look forward to talking to you again for an update on Monday, February 07, 2021 at 10:15 am- please be listening out for my call that day.  I will call as close to 10:15 am as possible; I look forward to hearing about your progress.   Please don't hesitate to contact me if I can be of assistance to you before our next scheduled appointment.   Oneta Rack, RN, BSN, [facility] RN Care Coordination- [place] [phone]: direct office [phone]: mobile    PATIENT GOALS:  Goals Addressed             This Visit's Progress    Monitor and Manage My Blood Sugar-Diabetes Type 2   On track    Timeframe:  Long-Range Goal Priority:  Medium Start Date:            08/02/20                 Expected End Date:  02/02/21                     Follow Up Date 02/07/21   Doristine Devoid job keeping up with checking blood sugars at home: continue writing the values down in your notebook: keep doing this and will review during each of our telephone calls- your blood sugars today look great! Check blood sugar if I feel it is too high or too low Enter blood sugar readings and use of insulin into daily log Take the blood sugar log to all doctor visits- this will help your doctor know which medications you should be on and how your medications are working  Keep up the great work following a low sugar, low-carbohydrate, low salt diet Call your doctor or care team if you develop concerns or questions about your blood sugar readings   Why is this important?   Checking your blood sugar at home helps to keep it from getting very high or very low.  Writing the results in a diary or log helps the doctor know how to care for you.  Your blood sugar log  should have the time, date and the results.  Also, write down the amount of insulin or other medicine that you take.  Other information, like what you ate, exercise done and how you were feeling, will also be helpful.           Track and Manage My Blood Pressure-Hypertension   On track    Timeframe:  Long-Range Goal Priority:  Medium Start Date:            08/02/20                 Expected End Date:         02/02/21              Follow Up Date 02/07/21   Doristine Devoid job keeping up with your blood pressures at home: keep up the great work-- the blood pressures you reported today are all in good range Continue monitoring/ recording blood pressures at home several times weekly: if you start to feel dizzy again, please increase your blood pressure monitoring  Contact your PCP office if you have  consistently low or high blood pressures I am glad to hear that you are not feeling as dizzy since you have started the neuro rehabilitation sessions!  Once your sessions have been completed, try to continue to do the exercises you have learned at home [place] job preventing falls at home: keep up the great work! Write blood pressure results in a log or diary- we will review your blood pressures at home during our future phone calls  Continue your efforts to eat a heart healthy, low salt diet   Why is this important?   You won't feel high blood pressure, but it can still hurt your blood vessels.  High blood pressure can cause heart or kidney problems. It can also cause a stroke.  Making lifestyle changes like losing a little weight or eating less salt will help.  Checking your blood pressure at home and at different times of the day can help to control blood pressure.  If the doctor prescribes medicine remember to take it the way the doctor ordered.  Call the office if you cannot afford the medicine or if there are questions about it.             The patient verbalized understanding of instructions,  educational materials, and care plan provided today and declined offer to receive copy of patient instructions, educational materials, and care plan- states he is in process of moving and would like to avoid extra mail Telephone follow up appointment with care management team member scheduled for:  Monday, February 07, 2021 at 10:15 am The patient has been provided with contact information for the care management team and has been advised to call with any health related questions or concerns  Oneta Rack, RN, BSN, [place] [phone]: direct office [phone]: mobile

## 2020-11-22 NOTE — Chronic Care Management (AMB) (Signed)
Chronic Care Management   CCM RN Visit Note  11/22/2020 Name: Antonio Adams MRN: [id number] DOB: 11/17/1945  Subjective: Antonio Adams is a 75 y.o. year old male who is a primary care patient of [name], Thomas L, MD. The care management team was consulted for assistance with disease management and care coordination needs.    Engaged with patient by telephone for follow up visit in response to provider referral for case management and/or care coordination services.   Consent to Services:  The patient was given information about Chronic Care Management services, agreed to services, and gave verbal consent prior to initiation of services.  Please see initial visit note for detailed documentation.  Patient agreed to services and verbal consent obtained.   Assessment: Review of patient past medical history, allergies, medications, health status, including review of consultants reports, laboratory and other test data, was performed as part of comprehensive evaluation and provision of chronic care management services.   SDOH (Social Determinants of Health) assessments and interventions performed:  SDOH Interventions    Flowsheet Row Most Recent Value  SDOH Interventions   Food Insecurity Interventions Intervention Not Indicated  Transportation Interventions Intervention Not Indicated  [Reports continues to drive self]       CCM Care Plan  Allergies  Allergen Reactions   Ace Inhibitors Swelling and Other (See Comments)   Lisinopril Swelling    Outpatient Encounter Medications as of 11/22/2020  Medication Sig Note   acetaminophen (TYLENOL) 500 MG tablet Take 2 tablets (1,000 mg total) by mouth every 6 (six) hours.    amLODipine (NORVASC) 5 MG tablet Take 1 tablet (5 mg total) by mouth daily. (Patient not taking: Reported on 11/09/2020) 10/18/2020: 10/18/20: Patient reports this medication was recently discontinued by renal provider    aspirin EC 81 MG tablet Take 81 mg by mouth  daily. Swallow whole.    atorvastatin (LIPITOR) 40 MG tablet Take 1 tablet (40 mg total) by mouth daily. 10/18/2020: 10/18/20: Patient reports taking one half tablet QD- states [facility] is [facility] take   cholecalciferol (VITAMIN D) 1000 units tablet Take 1,000 Units by mouth daily.    Continuous Blood Gluc Receiver (FREESTYLE LIBRE 14 DAY READER) DEVI 1 Act by Does not apply route daily.    Continuous Blood Gluc Sensor (FREESTYLE LIBRE 2 SENSOR) MISC 1 Act by Does not apply route daily.    diltiazem (TIAZAC) 120 MG 24 hr capsule Take 120 mg by mouth daily. (Patient not taking: No sig reported) 10/18/2020: 10/18/20: Patient reports this medication was recently discontinued by renal provider    dimenhyDRINATE (DRAMAMINE) 50 MG tablet Take 50 mg by mouth every 8 (eight) hours as needed for dizziness (vertigo). 10/18/2020: 10/18/20: Patient reports takes prn, OTC for vertigo, reports 2-3 times per day   docusate sodium (COLACE) 100 MG capsule Take 1 capsule (100 mg total) by mouth 2 (two) times daily. (Patient not taking: No sig reported)    ferrous sulfate 325 (65 FE) MG tablet Take 1 tablet (325 mg total) by mouth 2 (two) times daily. Patient needs office visit for further refills (Patient not taking: Reported on 11/09/2020)    gabapentin (NEURONTIN) 100 MG capsule Take 100 mg by mouth at bedtime.    gemfibrozil (LOPID) 600 MG tablet Take 600 mg by mouth 2 (two) times daily before a meal.    hydrOXYzine (ATARAX/VISTARIL) 25 MG tablet Take 25 mg by mouth 3 (three) times daily as needed for anxiety, itching or   nausea.  (Patient not taking: No sig reported) 10/18/2020: 10/18/20: Patient reports not taking medication    indapamide (LOZOL) 1.25 MG tablet TAKE 1 TABLET (1.25 MG TOTAL) BY MOUTH DAILY. (Patient not taking: No sig reported) 10/18/2020: 10/18/20: Patient reports this medication was recently discontinued by renal provider   insulin glargine (LANTUS SOLOSTAR) 100 UNIT/ML Solostar Pen Inject 30  Units into the skin daily as needed (blood sugar over 200). (Patient not taking: Reported on 11/09/2020) 10/18/2020: 10/18/20: Patient reports has not needed recently   Insulin Pen Needle 32G X 6 MM MISC 1 Act by Does not apply route daily.    lisinopril (ZESTRIL) 40 MG tablet TAKE ONE-HALF TABLET BY MOUTH EVERY DAY FOR BLOOD PRESSURE (Patient not taking: No sig reported)    meclizine (ANTIVERT) 25 MG tablet Take 1 tablet (25 mg total) by mouth 3 (three) times daily as needed for dizziness. 10/18/2020: 10/18/20: Patient reports taking occasionally- also uses Dramamine OTC for vertigo   Omega-3 Fatty Acids (FISH OIL PO) Take 1 tablet by mouth daily.    ondansetron (ZOFRAN ODT) 4 MG disintegrating tablet 4mg ODT q4 hours prn nausea/vomit (Patient not taking: No sig reported)    ONE TOUCH ULTRA TEST test strip USE 2 (TWO) TIMES DAILY. AND LANCETS 2/DAY 250.03    OVER THE COUNTER MEDICATION Take 1 tablet by mouth 2 (two) times daily. Focus factor 10/18/2020: 10/18/20: Today, patient reports taking QD   Oxymetazoline HCl (VICKS SINEX NA) Place 1 spray into the nose as needed (congestion). (Patient not taking: No sig reported)    polyvinyl alcohol (LIQUIFILM TEARS) 1.4 % ophthalmic solution Place 1 drop into both eyes as needed for dry eyes. 10/18/2020: 10/18/20: Patient reports uses OTC brand, uses occasionally   solifenacin (VESICARE) 5 MG tablet Take 1 tablet (5 mg total) by mouth daily.    tamsulosin (FLOMAX) 0.4 MG CAPS capsule TAKE 1 CAPSULE (0.4 MG TOTAL) BY MOUTH DAILY. (Patient taking differently: Take 0.4 mg by mouth daily.)    tamsulosin (FLOMAX) 0.4 MG CAPS capsule Take by mouth.    traMADol (ULTRAM) 50 MG tablet Take 50-100 mg by mouth every 6 (six) hours as needed for pain. (Patient not taking: No sig reported)    No facility-administered encounter medications on file as of 11/22/2020.    Patient Active Problem List   Diagnosis Date Noted   Vertigo 09/23/2020   Encounter for general adult medical  examination with abnormal findings 05/20/2020   Tinnitus aurium, bilateral 05/20/2020   Metastatic renal cell carcinoma (HCC) 02/26/2020   Diabetic polyneuropathy associated with type 2 diabetes mellitus (HCC) 12/18/2019   CKD (chronic kidney disease) stage 3, GFR 30-59 ml/min (HCC) 10/08/2018   Murmur, cardiac 10/08/2018   DDD (degenerative disc disease), cervical 10/08/2018   OAB (overactive bladder) 04/04/2018   Seasonal allergic rhinitis due to pollen 11/01/2016   Microalbuminuria due to type 2 diabetes mellitus (HCC) 07/27/2016   Renal cell carcinoma (HCC) 10/06/2015   Varicose veins of both lower extremities 08/06/2015   Varicose veins of lower extremities with ulcer (HCC) 06/16/2015   Internal hemorrhoid, bleeding 08/03/2014   Post traumatic stress disorder (PTSD) 01/29/2014   Deficiency anemia 12/10/2013   Radiation proctitis 06/04/2012   Spinal stenosis of lumbar region with radiculopathy 10/30/2011    Class: Chronic   OSA (obstructive sleep apnea) 11/24/2010   Obesity, Class I, BMI 30-34.9 11/24/2010   Routine general medical examination at a health care facility 11/24/2010   ADENOCARCINOMA, PROSTATE, GLEASON GRADE 6 12/21/2009     Type II diabetes mellitus with manifestations ([place]) 03/16/2007   Hyperlipidemia with target LDL less than 100 03/16/2007   Essential hypertension 03/16/2007   GERD 03/16/2007    Conditions to be addressed/monitored:  HTN and DMII  Care Plan : Diabetes Type 2 (Adult)  Updates made by Antonio Royalty, RN since 11/22/2020 12:00 AM     Problem: Glycemic Management (Diabetes, Type 2)   Priority: Medium     Long-Range Goal: Glycemic Management Optimized   Start Date: 08/02/2020  Expected End Date: 02/02/2021  This Visit's Progress: On track  Recent Progress: On track  Priority: Medium  Note:   Objective:  Lab Results  Component Value Date   HGBA1C 7.6 (H) 07/12/2020   Lab Results  Component Value Date   CREATININE 3.26 (H) 07/22/2020    CREATININE 3.49 (H) 07/21/2020   CREATININE 3.54 (H) 07/12/2020   No results found for: EGFR Current Barriers:  Knowledge Deficits related to basic Diabetes pathophysiology and self care/management Self-care deficits: none identified Case Manager Clinical Goal(s):  Over the next 6 months, patient will: demonstrate improved adherence to prescribed treatment plan for diabetes self care/management as evidenced by:  daily monitoring and recording of CBG   adherence to ADA/ carb modified diet  contacting provider for new or worsened symptoms or questions Interventions:  Collaboration with [name], MD regarding development and update of comprehensive plan of care as evidenced by provider attestation and co-signature Inter-disciplinary care team collaboration (see longitudinal plan of care) Chart reviewed including relevant office notes, upcoming scheduled appointments, and lab results Discussed current  clinical condition with patient and confirmed no current clinical or medication concerns around Diabetes-- reviewed with patient recent blood sugars at home and he reports general fasting ranges consistently between 89-176; reports has not needed to use insulin lately: uses only when blood sugars are > 200; has not yet checked blood sugar this morning; reports fasting blood sugar yesterday of "97" Initiated verbal education around significance of/ goals of A1-C: reviewed with him his historical trend of A1-C values, and provided general correlation between A1-C values to home blood sugars Reviewed recent renal provider office visit 11/11/20 with patient: he reports provider said "condition is stable;" no changes made during recent office visit; labs continued to be monitored regularly; next appointment 12/15/20 Confirmed patient continues to follow low-carb/ sugar/ DM diet, "as much as possible;" he declines desire for additional printed education today: states he is in process of selling his home/  moving and does not wish to have any more papers than necessary: reiterated with patient verbal reinforcement of strategies to adhere to DM diet Reviewed upcoming provider appointments with patient and confirmed that patient has plans to attend all as scheduled: [facility] 11/25/20, possibly "last session;" renal provider 12/15/20; and PCP office visit 12/28/20 Self-Care Activities Self administers oral medications and insulin as prescribed Attends all scheduled provider appointments Checks blood sugars as prescribed  Attempts to adhere to prescribed ADA/carb modified-- will need ongoing education/ reinforcement of strategies for adherence Patient Goals: [employer] job keeping up with checking blood sugars at home: continue writing the values down in your notebook: keep doing this and will review during each of our telephone calls- your blood sugars today look great! Check blood sugar if I feel it is too high or too low Enter blood sugar readings and use of insulin into daily log Take the blood sugar log to all doctor visits- this will help your doctor know which medications you  should be on and how your medications are working  Keep up the great work following a low sugar, low-carbohydrate, low salt diet Call your doctor or care team if you develop concerns or questions about your blood sugar readings Follow Up Plan:  Telephone follow up appointment with care management team member scheduled for: Monday February 07, 2021 between at 10:15 am The patient has been provided with contact information for the care management team and has been advised to call with any health related questions or concerns.      Care Plan : Hypertension (Adult)  Updates made by Antonio Royalty, RN since 11/22/2020 12:00 AM     Problem: Hypertension (Hypertension)   Priority: Medium     Long-Range Goal: Hypertension Monitored   Start Date: 08/02/2020  Expected End Date: 02/02/2021  This Visit's Progress: On track   Recent Progress: On track  Priority: Medium  Note:   Objective:  Last practice recorded BP readings:  BP Readings from Last 3 Encounters:  07/22/20 (!) 154/92  07/12/20 (!) 160/89  05/20/20 (!) 152/78  Most recent eGFR/CrCl: No results found for: EGFR  No components found for: CRCL Current Barriers:  Knowledge Deficits related to basic understanding of hypertension pathophysiology and self care management Self care deficits: none identified today; barriers include challenges of preparing meals at home for one person (lives alone); multiple chronic conditions to manage, including diabetes and cancer Case Manager Clinical Goal(s):  Over the next 6 months, patient will demonstrate improved adherence to prescribed treatment plan for hypertension as evidenced by  taking all medications as prescribed monitoring and recording blood pressure at home twice weekly adhering to low sodium/DASH diet Interventions:  Collaboration with [name], MD regarding development and update of comprehensive plan of care as evidenced by provider attestation and co-signature Inter-disciplinary care team collaboration (see longitudinal plan of care) Evaluation of current treatment plan related to hypertension self management and patient's adherence to plan as established by provider Chart reviewed including relevant office notes, upcoming scheduled appointments, and lab results Discussed current  clinical condition with patient and confirmed he believes that his previously reported vertigo is "somewhat/ slightly better;" continues able to manage ADL's/ iADL's independently, continues able to drive Reviewed recent sessions with neuro-rehabilitation team: patient believes these have helped his dizziness and confirms no recent falls at home: reports now he is not having to use any assistive devices for ambulation; believes next neuro-rehabilitation session on 11/25/20 will be his last visit: encouraged patient to  incorporate ongoing exercises he has learned into his daily/ regular routine: reports he "will try" Confirmed patient continues to monitor/ record blood pressures at home- reviewed with patient today-- reports blood pressure ranges consistently between 138-160/84-90; has not yet checked his blood pressure this morning; denies signs/ symptoms low-high blood pressure at home  Provided verbal education around signs/ symptoms MI/ CVA, along with corresponding action plan- will require ongoing reinforcement; declines desire for additional prinited education today: reports he is moving soon and does not want extra mail until his move is completed Reviewed recent renal provider office visit 11/11/20 with patient: reports no recent changes to overall plan of care/ medications; reports "closely monitoring lab work for" kidney function Denies concerns around medications: continues to manage independently Confirmed patient continues to endorse following low-sodium, heart healthy, DM diet: positive reinforcement provided Reviewed upcoming provider appointments with patient and confirmed that patient has plans to attend all as scheduled Discussed plans with patient for ongoing care management follow  up and provided patient with direct contact information for care management team: encouraged patient to contact RN CM sooner than scheduled appointment for any worsening of condition or concerns/ questions Self-Care Activities: Self administers medications as prescribed Attends all scheduled provider appointments Calls provider office for new concerns/ questions Agrees to monitor/ record BP twice weekly for now Attempts to follow a low sodium diet/DASH diet: will need ongoing reinforcement/ education Patient Goals: [employer] job keeping up with your blood pressures at home: keep up the great work-- the blood pressures you reported today are all in good range Continue monitoring/ recording blood pressures at home several  times weekly: if you start to feel dizzy again, please increase your blood pressure monitoring  Contact your PCP office if you have consistently low or high blood pressures I am glad to hear that you are not feeling as dizzy since you have started the neuro rehabilitation sessions!  Once your sessions have been completed, try to continue to do the exercises you have learned at home [place] job preventing falls at home: keep up the great work! Write blood pressure results in a log or diary- we will review your blood pressures at home during our future phone calls  Continue your efforts to eat a heart healthy, low salt diet Follow Up Plan:  Telephone follow up appointment with care management team member scheduled for: Monday, February 07, 2021 at 10:15 am The patient has been provided with contact information for the care management team and has been advised to call with any health related questions or concerns.      Plan: Telephone follow up appointment with care management team member scheduled for:  Monday, February 07, 2021 at 10:15 am The patient has been provided with contact information for the care management team and has been advised to call with any health related questions or concerns  Oneta Rack, RN, BSN, [place] [phone]: direct office [phone]: mobile

## 2020-11-25 ENCOUNTER — Ambulatory Visit: Payer: Medicare Other | Admitting: Physical Therapy

## 2020-11-25 ENCOUNTER — Other Ambulatory Visit: Payer: Self-pay

## 2020-11-25 DIAGNOSIS — H8111 Benign paroxysmal vertigo, right ear: Secondary | ICD-10-CM | POA: Diagnosis not present

## 2020-11-26 NOTE — Therapy (Signed)
[facility] [address] [place], [place], [address] Phone: [phone]   Fax:  [phone]  Physical Therapy Treatment  Patient Details  Name: Antonio Adams MRN: [id number] Date of Birth: 17-Aug-1945 Referring Provider (PT): Scarlette Calico, MD   Encounter Date: 11/25/2020   PT End of Session - 11/26/20 0928     Visit Number 3    Number of Visits 4    Authorization Type [employer] Medicare    Authorization Time Period 11-01-20 - 12-01-20    PT Start Time 8088    PT Stop Time 1440    PT Time Calculation (min) 35 min    Activity Tolerance Patient tolerated treatment well    Behavior During Therapy [facility] for tasks assessed/performed             Past Medical History:  Diagnosis Date   ADENOCARCINOMA, PROSTATE, GLEASON GRADE 6 12/21/2009   prostate cancer   ALLERGIC RHINITIS 03/16/2007   Allergy    Anxiety    Arthritis    back    BACK PAIN WITH RADICULOPATHY 11/23/2008   Cancer of kidney ([place])    partial right kidney removed   CARPAL TUNNEL SYNDROME, BILATERAL 03/16/2007   Chronic back pain    Constipation    takes Senokot daily   Depression    occasionally   DIABETES MELLITUS, TYPE II 03/16/2007   takes Januvia and MEtformin daily   GASTROINTESTINAL HEMORRHAGE, HX OF 03/16/2007   GERD 03/16/2007   takes Omeprazole daily   Glaucoma    mild - no eye drops   Hemorrhoids    History of colon polyps    HYPERLIPIDEMIA 03/16/2007   takes Crestor daily   HYPERTENSION 03/16/2007   takes Diltiazem and Lisinopril daily    Kidney cancer, primary, with metastasis from kidney to other site [facility])    LEG CRAMPS 05/22/2007   Nocturia    Overactive bladder    PARESTHESIA 05/21/2007   Proctitis    PTSD (post-traumatic stress disorder)    wakes up may be dreaming of fighting   Rectal bleeding    Dr.Norins has explained its from the Radiation that he has received   Rectal bleeding    Renal cell carcinoma ([place])    Sleep apnea     uses CPAP nightly   Stomach cancer ([place])    Urinary frequency    takes Toviaz daily    Past Surgical History:  Procedure Laterality Date   ADRENALECTOMY Right 02/26/2020   Procedure: OPEN RIGHT RENAL CELL METASTATECTOMY AND  ADRENALECTOMY;  Surgeon: [name], MD;  Location: [facility];  Service: Urology;  Laterality: Right;   [place], 2008   repeat surgery; ESI '08   [place]     bilateral   CERVICAL FUSION     COLONOSCOPY     colonosocpy     ESOPHAGOGASTRODUODENOSCOPY     ED   FLEXIBLE SIGMOIDOSCOPY N/A 01/08/2017   Procedure: FLEXIBLE SIGMOIDOSCOPY;  Surgeon: Irene Shipper, MD;  Location: [facility];  Service: Endoscopy;  Laterality: N/A;   HOT HEMOSTASIS N/A 01/08/2017   Procedure: HOT HEMOSTASIS (ARGON PLASMA COAGULATION/BICAP);  Surgeon: Irene Shipper, MD;  Location: [facility] ENDOSCOPY;  Service: Endoscopy;  Laterality: N/A;   IR RADIOLOGIST EVAL & MGMT  12/17/2019   IR RADIOLOGIST EVAL & MGMT  07/01/2020   IR RADIOLOGIST EVAL & MGMT  08/25/2020   LUMBAR LAMINECTOMY  10/30/2011   Procedure: MICRODISCECTOMY LUMBAR LAMINECTOMY;  Surgeon:  Jessy Oto, MD;  Location: [facility];  Service: Orthopedics;  Laterality: Right;  Right L4-5 and L5-S1 Microdiscectomy   LUMBAR LAMINECTOMY  06/17/2012   Procedure: MICRODISCECTOMY LUMBAR LAMINECTOMY;  Surgeon: Jessy Oto, MD;  Location: [facility];  Service: Orthopedics;  Laterality: N/A;  Right L5-S1 microdiscectomy   OPEN PARTIAL HEPATECTOMY  N/A 02/26/2020   Procedure: OPEN PARTIAL HEPATECTOMY, CHOLECYSTECTOMY;  Surgeon: Stark Klein, MD;  Location: [facility];  Service: General;  Laterality: N/A;   POLYPECTOMY     PROSTATE SURGERY  march 2012   seed implant   RADIOFREQUENCY ABLATION N/A 07/21/2020   Procedure: CT MICROWAVE ABLATION;  Surgeon: Criselda Peaches, MD;  Location: [facility];  Service: Anesthesiology;  Laterality: N/A;   ROBOTIC ASSITED PARTIAL NEPHRECTOMY Right 09/08/2015   Procedure: XI ROBOTIC ASSITED RIGHT PARTIAL  NEPHRECTOMY;  Surgeon: [name], MD;  [facility];  Service: Urology;  Laterality: Right;  Clamp on: 1033 Clamp off: 1103 Total Clamp Time: 30 minutes   SHOULDER ARTHROSCOPY WITH SUBACROMIAL DECOMPRESSION Left 01/20/2016   Procedure: LEFT SHOULDER ARTHROSCOPY WITH EXTENSIVE  DEBRIDEMENT, ACROMIOPLASTY;  Surgeon: Ninetta Lights, MD;  Location: [facility];  Service: Orthopedics;  Laterality: Left;   SHOULDER SURGERY  LFT   Stress Cardiolite  12/24/2003   negative for ischemia   UPPER ESOPHAGEAL ENDOSCOPIC ULTRASOUND (EUS)  04/18/2016   [facility]   UPPER GASTROINTESTINAL ENDOSCOPY     WOUND EXPLORATION     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   Subjective Assessment - 11/26/20 0917     Subjective Pt states he still has some dizziness at night when he rolls onto his right side - states it is "better than it was"    Currently in Pain? No/denies                                Vestibular Treatment/Exercise - 11/26/20 0001        EPLEY MANUEVER RIGHT   Number of Reps  2    Overall Response Improved Symptoms    Response Details  no nystagmus and no c/o vertigo in any position on 2nd rep                   PT Education - 11/26/20 0928     Education Details Reviewed Brandt-Daroff exercise for habituation prn    Person(s) Educated Patient    Methods Explanation;Demonstration    Comprehension Verbalized understanding                 PT Long Term Goals - 11/26/20 0933       PT LONG TERM GOAL #1   Title Pt will have (-) Rt Dix-Hallpike test to indicate resolution of Rt BPPV.    Time 3    Period Weeks    Status On-going      PT LONG TERM GOAL #2   Title Independent in HEP for [employer] exercises for self treatment prn.    Time 3    Period Weeks    Status On-going                   Plan - 11/26/20 0932     Clinical Impression  Statement Pt had no nystagmus and no c/o vertigo on 2nd rep of Epley for Rt BPPV posterior  canalithiasis; pt declined 3rd rep of Epley due to no symptoms on 2nd rep.  Will continue to assess - plan D/C next session if BPPV resolved.    Personal Factors and Comorbidities Comorbidity 1;Time since onset of injury/illness/exacerbation    Examination-Activity Limitations Locomotion Level;Transfers;Bed Mobility;Reach Overhead    Examination-Participation Restrictions Cleaning;Meal Prep;Shop;Laundry;Yard Work    Stability/Clinical Decision Making Stable/Uncomplicated    Rehab Potential Good    PT Frequency 1x / week    PT Duration 2 weeks    PT Treatment/Interventions ADLs/Self Care Home Management;Canalith Repostioning;Balance training;Neuromuscular re-education;Patient/family education;Therapeutic activities;Therapeutic exercise    PT Next Visit Plan recheck Rt BPPV - D/C    PT Home Exercise Plan Brandt-Daroff exs.    Consulted and Agree with Plan of Care Patient             Patient will benefit from skilled therapeutic intervention in order to improve the following deficits and impairments:  Dizziness, Decreased balance  Visit Diagnosis: BPPV (benign paroxysmal positional vertigo), right     Problem List Patient Active Problem List   Diagnosis Date Noted   Vertigo 09/23/2020   Encounter for general adult medical examination with abnormal findings 05/20/2020   Tinnitus aurium, bilateral 05/20/2020   Metastatic renal cell carcinoma ([place]) 02/26/2020   Diabetic polyneuropathy associated with type 2 diabetes mellitus ([place]) 12/18/2019   CKD (chronic kidney disease) stage 3, GFR 30-59 ml/min ([place]) 10/08/2018   Murmur, cardiac 10/08/2018   DDD (degenerative disc disease), cervical 10/08/2018   OAB (overactive bladder) 04/04/2018   Seasonal allergic rhinitis due to pollen 11/01/2016   Microalbuminuria due to type 2 diabetes mellitus ([place]) 07/27/2016   Renal cell carcinoma ([place]) 10/06/2015    Varicose veins of both lower extremities 08/06/2015   Varicose veins of lower extremities with ulcer ([place]) 06/16/2015   Internal hemorrhoid, bleeding 08/03/2014   Post traumatic stress disorder (PTSD) 01/29/2014   Deficiency anemia 12/10/2013   Radiation proctitis 06/04/2012   Spinal stenosis of lumbar region with radiculopathy 10/30/2011    Class: Chronic   OSA (obstructive sleep apnea) 11/24/2010   Obesity, Class I, BMI 30-34.9 11/24/2010   Routine general medical examination at a health care facility 11/24/2010   ADENOCARCINOMA, PROSTATE, GLEASON GRADE 6 12/21/2009   Type II diabetes mellitus with manifestations ([place]) 03/16/2007   Hyperlipidemia with target LDL less than 100 03/16/2007   Essential hypertension 03/16/2007   GERD 03/16/2007    Alda Lea, PT 11/26/2020, 9:35 AM  [facility] [address] [place], [place], [address] Phone: [phone]   Fax:  [phone]  Name: Antonio Adams MRN: [id number] Date of Birth: [birth year]

## 2020-11-30 ENCOUNTER — Other Ambulatory Visit: Payer: Self-pay | Admitting: Internal Medicine

## 2020-12-02 LAB — BASIC METABOLIC PANEL
BUN: 37 — AB (ref 4–21)
CO2: 21 (ref 13–22)
Chloride: 109 — AB (ref 99–108)
Creatinine: 4.4 — AB (ref 0.6–1.3)
Glucose: 152
Potassium: 5 (ref 3.4–5.3)
Sodium: 138 (ref 137–147)

## 2020-12-02 LAB — HEPATIC FUNCTION PANEL
ALT: 29 (ref 10–40)
AST: 28 (ref 14–40)
Alkaline Phosphatase: 152 — AB (ref 25–125)

## 2020-12-02 LAB — LIPID PANEL
Cholesterol: 176 (ref 0–200)
HDL: 51 (ref 35–70)
LDL Cholesterol: 108
Triglycerides: 86 (ref 40–160)

## 2020-12-02 LAB — CBC AND DIFFERENTIAL
HCT: 28 — AB (ref 41–53)
Hemoglobin: 9.4 — AB (ref 13.5–17.5)
Platelets: 224 (ref 150–399)
WBC: 4.5

## 2020-12-02 LAB — HEMOGLOBIN A1C: Hemoglobin A1C: 6.3

## 2020-12-16 ENCOUNTER — Ambulatory Visit (INDEPENDENT_AMBULATORY_CARE_PROVIDER_SITE_OTHER): Payer: Medicare Other

## 2020-12-16 ENCOUNTER — Ambulatory Visit: Payer: Medicare Other | Admitting: Physical Therapy

## 2020-12-16 ENCOUNTER — Other Ambulatory Visit: Payer: Self-pay

## 2020-12-16 VITALS — BP 142/70 | HR 70 | Temp 98.1°F | Ht 71.0 in | Wt 202.4 lb

## 2020-12-16 DIAGNOSIS — Z0000 Encounter for general adult medical examination without abnormal findings: Secondary | ICD-10-CM

## 2020-12-16 NOTE — Patient Instructions (Addendum)
[name] , Thank you for taking time to come for your Medicare Wellness Visit. I appreciate your ongoing commitment to your health goals. Please review the following plan we discussed and let me know if I can assist you in the future.   Screening recommendations/referrals: Colonoscopy: last done 01/08/2017; due every 5 years Recommended yearly ophthalmology/optometry visit for glaucoma screening and checkup Recommended yearly dental visit for hygiene and checkup  Vaccinations: Influenza vaccine: 05/13/2020; due Fall 2022 Pneumococcal vaccine: 12/10/2013, 01/01/2018 (completed) Tdap vaccine: 07/18/2011; due every 10 years  Shingles vaccine: patient declined   Covid-19: 11/15/2019, 12/06/2019, booster  Advanced directives: Advance directive discussed with you today. Even though you declined this today please call our office should you change your mind and we can give you the proper paperwork for you to fill out.  Conditions/risks identified: Yes; Client understands the importance of follow-up with providers by attending scheduled visits and discussed goals to eat healthier, increase physical activity, exercise the brain, socialize more, get enough sleep and make time for laughter.  Next appointment: Please schedule your next Medicare Wellness Visit with your Nurse Health Advisor in 1 year by calling [phone].  Preventive Care 75 Years and Older, Male Preventive care refers to lifestyle choices and visits with your health care provider that can promote health and wellness. What does preventive care include? A yearly physical exam. This is also called an annual well check. Dental exams once or twice a year. Routine eye exams. Ask your health care provider how often you should have your eyes checked. Personal lifestyle choices, including: Daily care of your teeth and gums. Regular physical activity. Eating a healthy diet. Avoiding tobacco and drug use. Limiting alcohol use. Practicing  safe sex. Taking low doses of aspirin every day. Taking vitamin and mineral supplements as recommended by your health care provider. What happens during an annual well check? The services and screenings done by your health care provider during your annual well check will depend on your age, overall health, lifestyle risk factors, and family history of disease. Counseling  Your health care provider may ask you questions about your: Alcohol use. Tobacco use. Drug use. Emotional well-being. Home and relationship well-being. Sexual activity. Eating habits. History of falls. Memory and ability to understand (cognition). Work and work Statistician. Screening  You may have the following tests or measurements: Height, weight, and BMI. Blood pressure. Lipid and cholesterol levels. These may be checked every 5 years, or more frequently if you are over 27 years old. Skin check. Lung cancer screening. You may have this screening every year starting at age 26 if you have a 30-pack-year history of smoking and currently smoke or have quit within the past 15 years. Fecal occult blood test (FOBT) of the stool. You may have this test every year starting at age 43. Flexible sigmoidoscopy or colonoscopy. You may have a sigmoidoscopy every 5 years or a colonoscopy every 10 years starting at age 36. Prostate cancer screening. Recommendations will vary depending on your family history and other risks. Hepatitis C blood test. Hepatitis B blood test. Sexually transmitted disease (STD) testing. Diabetes screening. This is done by checking your blood sugar (glucose) after you have not eaten for a while (fasting). You may have this done every 1-3 years. Abdominal aortic aneurysm (AAA) screening. You may need this if you are a current or former smoker. Osteoporosis. You may be screened starting at age 76 if you are at high risk. Talk with your health care provider about  your test results, treatment options, and  if necessary, the need for more tests. Vaccines  Your health care provider may recommend certain vaccines, such as: Influenza vaccine. This is recommended every year. Tetanus, diphtheria, and acellular pertussis (Tdap, Td) vaccine. You may need a Td booster every 10 years. Zoster vaccine. You may need this after age 16. Pneumococcal 13-valent conjugate (PCV13) vaccine. One dose is recommended after age 69. Pneumococcal polysaccharide (PPSV23) vaccine. One dose is recommended after age 61. Talk to your health care provider about which screenings and vaccines you need and how often you need them. This information is not intended to replace advice given to you by your health care provider. Make sure you discuss any questions you have with your health care provider. Document Released: 05/28/2015 Document Revised: 01/19/2016 Document Reviewed: 03/02/2015 Elsevier Interactive Patient Education  2017 [place] Prevention in the Home Falls can cause injuries. They can happen to people of all ages. There are many things you can do to make your home safe and to help prevent falls. What can I do on the outside of my home? Regularly fix the edges of walkways and driveways and fix any cracks. Remove anything that might make you trip as you walk through a door, such as a raised step or threshold. Trim any bushes or trees on the path to your home. Use bright outdoor lighting. Clear any walking paths of anything that might make someone trip, such as rocks or tools. Regularly check to see if handrails are loose or broken. Make sure that both sides of any steps have handrails. Any raised decks and porches should have guardrails on the edges. Have any leaves, snow, or ice cleared regularly. Use sand or salt on walking paths during winter. Clean up any spills in your garage right away. This includes oil or grease spills. What can I do in the bathroom? Use night lights. Install grab bars by the  toilet and in the tub and shower. Do not use towel bars as grab bars. Use non-skid mats or decals in the tub or shower. If you need to sit down in the shower, use a plastic, non-slip stool. Keep the floor dry. Clean up any water that spills on the floor as soon as it happens. Remove soap buildup in the tub or shower regularly. Attach bath mats securely with double-sided non-slip rug tape. Do not have throw rugs and other things on the floor that can make you trip. What can I do in the bedroom? Use night lights. Make sure that you have a light by your bed that is easy to reach. Do not use any sheets or blankets that are too big for your bed. They should not hang down onto the floor. Have a firm chair that has side arms. You can use this for support while you get dressed. Do not have throw rugs and other things on the floor that can make you trip. What can I do in the kitchen? Clean up any spills right away. Avoid walking on wet floors. Keep items that you use a lot in easy-to-reach places. If you need to reach something above you, use a strong step stool that has a grab bar. Keep electrical cords out of the way. Do not use floor polish or wax that makes floors slippery. If you must use wax, use non-skid floor wax. Do not have throw rugs and other things on the floor that can make you trip. What can I do with  my stairs? Do not leave any items on the stairs. Make sure that there are handrails on both sides of the stairs and use them. Fix handrails that are broken or loose. Make sure that handrails are as long as the stairways. Check any carpeting to make sure that it is firmly attached to the stairs. Fix any carpet that is loose or worn. Avoid having throw rugs at the top or bottom of the stairs. If you do have throw rugs, attach them to the floor with carpet tape. Make sure that you have a light switch at the top of the stairs and the bottom of the stairs. If you do not have them, ask someone  to add them for you. What else can I do to help prevent falls? Wear shoes that: Do not have high heels. Have rubber bottoms. Are comfortable and fit you well. Are closed at the toe. Do not wear sandals. If you use a stepladder: Make sure that it is fully opened. Do not climb a closed stepladder. Make sure that both sides of the stepladder are locked into place. Ask someone to hold it for you, if possible. Clearly mark and make sure that you can see: Any grab bars or handrails. First and last steps. Where the edge of each step is. Use tools that help you move around (mobility aids) if they are needed. These include: Canes. Walkers. Scooters. Crutches. Turn on the lights when you go into a dark area. Replace any light bulbs as soon as they burn out. Set up your furniture so you have a clear path. Avoid moving your furniture around. If any of your floors are uneven, fix them. If there are any pets around you, be aware of where they are. Review your medicines with your doctor. Some medicines can make you feel dizzy. This can increase your chance of falling. Ask your doctor what other things that you can do to help prevent falls. This information is not intended to replace advice given to you by your health care provider. Make sure you discuss any questions you have with your health care provider. Document Released: 02/25/2009 Document Revised: 10/07/2015 Document Reviewed: 06/05/2014 Elsevier Interactive Patient Education  2017 [employer].

## 2020-12-16 NOTE — Progress Notes (Signed)
Subjective:   Antonio Adams is a 75 y.o. male who presents for Medicare Annual/Subsequent preventive examination.  Review of Systems     Cardiac Risk Factors include: advanced age (>43men, >25 women);diabetes mellitus;dyslipidemia;family history of premature cardiovascular disease;hypertension;male gender     Objective:    Today's Vitals   12/16/20 1042  BP: (!) 142/70  Pulse: 70  Temp: 98.1 F (36.7 C)  SpO2: 99%  Weight: 202 lb 6.4 oz (91.8 kg)  Height: 5\' 11"  (1.803 m)  PainSc: 0-No pain   Body mass index is 28.23 kg/m.  Advanced Directives 12/16/2020 08/31/2020 08/02/2020 07/12/2020 06/07/2020 02/28/2020 02/16/2020  Does Patient Have a Medical Advance Directive? No No No No No No No  Type of Advance Directive - - - - - - -  Copy of Healthcare Power of Attorney in Chart? - - - - - - -  Would patient like information on creating a medical advance directive? No - Patient declined No - Patient declined No - Patient declined No - Patient declined Yes (MAU/Ambulatory/Procedural Areas - Information given) Yes (Inpatient - patient defers creating a medical advance directive at this time - Information given) Yes (MAU/Ambulatory/Procedural Areas - Information given)  Pre-existing out of facility DNR order (yellow form or pink MOST form) - - - - - - -    Current Medications (verified) Outpatient Encounter Medications as of 12/16/2020  Medication Sig   acetaminophen (TYLENOL) 500 MG tablet Take 2 tablets (1,000 mg total) by mouth every 6 (six) hours.   amLODipine (NORVASC) 5 MG tablet Take 1 tablet (5 mg total) by mouth daily. (Patient not taking: Reported on 11/09/2020)   aspirin EC 81 MG tablet Take 81 mg by mouth daily. Swallow whole.   atorvastatin (LIPITOR) 40 MG tablet Take 1 tablet (40 mg total) by mouth daily.   cholecalciferol (VITAMIN D) 1000 units tablet Take 1,000 Units by mouth daily.   Continuous Blood Gluc Receiver (FREESTYLE LIBRE 14 DAY READER) DEVI 1 Act by Does not  apply route daily.   Continuous Blood Gluc Sensor (FREESTYLE LIBRE 2 SENSOR) MISC 1 Act by Does not apply route daily.   diltiazem (TIAZAC) 120 MG 24 hr capsule Take 120 mg by mouth daily. (Patient not taking: No sig reported)   dimenhyDRINATE (DRAMAMINE) 50 MG tablet Take 50 mg by mouth every 8 (eight) hours as needed for dizziness (vertigo).   docusate sodium (COLACE) 100 MG capsule Take 1 capsule (100 mg total) by mouth 2 (two) times daily. (Patient not taking: No sig reported)   ferrous sulfate 325 (65 FE) MG tablet Take 1 tablet (325 mg total) by mouth 2 (two) times daily. Patient needs office visit for further refills (Patient not taking: Reported on 11/09/2020)   gabapentin (NEURONTIN) 100 MG capsule Take 100 mg by mouth at bedtime.   gemfibrozil (LOPID) 600 MG tablet Take 600 mg by mouth 2 (two) times daily before a meal.   hydrOXYzine (ATARAX/VISTARIL) 25 MG tablet Take 25 mg by mouth 3 (three) times daily as needed for anxiety, itching or nausea.  (Patient not taking: No sig reported)   indapamide (LOZOL) 1.25 MG tablet TAKE 1 TABLET BY MOUTH DAILY.   insulin glargine (LANTUS SOLOSTAR) 100 UNIT/ML Solostar Pen Inject 30 Units into the skin daily as needed (blood sugar over 200). (Patient not taking: Reported on 11/09/2020)   Insulin Pen Needle 32G X 6 MM MISC 1 Act by Does not apply route daily.   lisinopril (ZESTRIL) 40 MG tablet  TAKE ONE-HALF TABLET BY MOUTH EVERY DAY FOR BLOOD PRESSURE (Patient not taking: No sig reported)   meclizine (ANTIVERT) 25 MG tablet Take 1 tablet (25 mg total) by mouth 3 (three) times daily as needed for dizziness.   Omega-3 Fatty Acids (FISH OIL PO) Take 1 tablet by mouth daily.   ondansetron (ZOFRAN ODT) 4 MG disintegrating tablet 4mg  ODT q4 hours prn nausea/vomit (Patient not taking: No sig reported)   ONE TOUCH ULTRA TEST test strip USE 2 (TWO) TIMES DAILY. AND LANCETS 2/DAY 250.03   OVER THE COUNTER MEDICATION Take 1 tablet by mouth 2 (two) times daily.  Focus factor   Oxymetazoline HCl (VICKS SINEX NA) Place 1 spray into the nose as needed (congestion). (Patient not taking: No sig reported)   polyvinyl alcohol (LIQUIFILM TEARS) 1.4 % ophthalmic solution Place 1 drop into both eyes as needed for dry eyes.   solifenacin (VESICARE) 5 MG tablet Take 1 tablet (5 mg total) by mouth daily.   tamsulosin (FLOMAX) 0.4 MG CAPS capsule TAKE 1 CAPSULE (0.4 MG TOTAL) BY MOUTH DAILY. (Patient taking differently: Take 0.4 mg by mouth daily.)   tamsulosin (FLOMAX) 0.4 MG CAPS capsule Take by mouth.   traMADol (ULTRAM) 50 MG tablet Take 50-100 mg by mouth every 6 (six) hours as needed for pain. (Patient not taking: No sig reported)   No facility-administered encounter medications on file as of 12/16/2020.    Allergies (verified) Ace inhibitors and Lisinopril   History: Past Medical History:  Diagnosis Date   ADENOCARCINOMA, PROSTATE, GLEASON GRADE 6 12/21/2009   prostate cancer   ALLERGIC RHINITIS 03/16/2007   Allergy    Anxiety    Arthritis    back    BACK PAIN WITH RADICULOPATHY 11/23/2008   Cancer of kidney ([place])    partial right kidney removed   CARPAL TUNNEL SYNDROME, BILATERAL 03/16/2007   Chronic back pain    Constipation    takes Senokot daily   Depression    occasionally   DIABETES MELLITUS, TYPE II 03/16/2007   takes Januvia and MEtformin daily   GASTROINTESTINAL HEMORRHAGE, HX OF 03/16/2007   GERD 03/16/2007   takes Omeprazole daily   Glaucoma    mild - no eye drops   Hemorrhoids    History of colon polyps    HYPERLIPIDEMIA 03/16/2007   takes Crestor daily   HYPERTENSION 03/16/2007   takes Diltiazem and Lisinopril daily    Kidney cancer, primary, with metastasis from kidney to other site [facility])    LEG CRAMPS 05/22/2007   Nocturia    Overactive bladder    PARESTHESIA 05/21/2007   Proctitis    PTSD (post-traumatic stress disorder)    wakes up may be dreaming of fighting   Rectal bleeding    Dr.Norins has explained its from the Radiation  that he has received   Rectal bleeding    Renal cell carcinoma ([place])    Sleep apnea    uses CPAP nightly   Stomach cancer ([place])    Urinary frequency    takes Toviaz daily   Past Surgical History:  Procedure Laterality Date   ADRENALECTOMY Right 02/26/2020   Procedure: OPEN RIGHT RENAL CELL METASTATECTOMY AND  ADRENALECTOMY;  Surgeon: [name], MD;  Location: [facility];  Service: Urology;  Laterality: Right;   [place], 2008   repeat surgery; ESI '08   CARPAL TUNNEL RELEASE     bilateral   CERVICAL FUSION     COLONOSCOPY  colonosocpy     ESOPHAGOGASTRODUODENOSCOPY     ED   FLEXIBLE SIGMOIDOSCOPY N/A 01/08/2017   Procedure: FLEXIBLE SIGMOIDOSCOPY;  Surgeon: Irene Shipper, MD;  Location: [facility] ENDOSCOPY;  Service: Endoscopy;  Laterality: N/A;   HOT HEMOSTASIS N/A 01/08/2017   Procedure: HOT HEMOSTASIS (ARGON PLASMA COAGULATION/BICAP);  Surgeon: Irene Shipper, MD;  Location: [facility]COPY;  Service: Endoscopy;  Laterality: N/A;   IR RADIOLOGIST EVAL & MGMT  12/17/2019   IR RADIOLOGIST EVAL & MGMT  07/01/2020   IR RADIOLOGIST EVAL & MGMT  08/25/2020   LUMBAR LAMINECTOMY  10/30/2011   Procedure: MICRODISCECTOMY LUMBAR LAMINECTOMY;  Surgeon: Jessy Oto, MD;  Location: [place];  Service: Orthopedics;  Laterality: Right;  Right L4-5 and L5-S1 Microdiscectomy   LUMBAR LAMINECTOMY  06/17/2012   Procedure: MICRODISCECTOMY LUMBAR LAMINECTOMY;  Surgeon: Jessy Oto, MD;  Location: [place];  Service: Orthopedics;  Laterality: N/A;  Right L5-S1 microdiscectomy   OPEN PARTIAL HEPATECTOMY  N/A 02/26/2020   Procedure: OPEN PARTIAL HEPATECTOMY, CHOLECYSTECTOMY;  Surgeon: Stark Klein, MD;  Location: [facility];  Service: General;  Laterality: N/A;   POLYPECTOMY     PROSTATE SURGERY  march 2012   seed implant   RADIOFREQUENCY ABLATION N/A 07/21/2020   Procedure: CT MICROWAVE ABLATION;  Surgeon: Criselda Peaches, MD;  Location: [facility];  Service: Anesthesiology;  Laterality: N/A;   ROBOTIC  ASSITED PARTIAL NEPHRECTOMY Right 09/08/2015   Procedure: XI ROBOTIC ASSITED RIGHT PARTIAL NEPHRECTOMY;  Surgeon: [name], MD;  Location: [facility];  Service: Urology;  Laterality: Right;  Clamp on: 1033 Clamp off: 1103 Total Clamp Time: 30 minutes   SHOULDER ARTHROSCOPY WITH SUBACROMIAL DECOMPRESSION Left 01/20/2016   Procedure: LEFT SHOULDER ARTHROSCOPY WITH EXTENSIVE  DEBRIDEMENT, ACROMIOPLASTY;  Surgeon: Ninetta Lights, MD;  Location: [place];  Service: Orthopedics;  Laterality: Left;   SHOULDER SURGERY  LFT   Stress Cardiolite  12/24/2003   negative for ischemia   UPPER ESOPHAGEAL ENDOSCOPIC ULTRASOUND (EUS)  04/18/2016   [facility]   UPPER GASTROINTESTINAL ENDOSCOPY     WOUND EXPLORATION     management of a wound that he sustained in the [place]   Family History  Problem Relation Age of Onset   Cancer Sister 83       Cancer, unsure type   Hypertension Other    Colon cancer Neg Hx    Esophageal cancer Neg Hx    Stomach cancer Neg Hx    Diabetes Neg Hx    Colon polyps Neg Hx    Rectal cancer Neg Hx    Social History   Socioeconomic History   Marital status: Widowed    Spouse name: Deneise Lever   Number of children: 3   Years of education: 12+   Highest education level: Not on file  Occupational History   Occupation: Event organiser; Banker: RETIRED    Comment: retired from Event organiser  Tobacco Use   Smoking status: Never   Smokeless tobacco: Never  Vaping Use   Vaping Use: Never used  Substance and Sexual Activity   Alcohol use: Not Currently    Alcohol/week: 0.0 standard drinks   Drug use: No   Sexual activity: Not Currently    Partners: Female  Other Topics Concern   Not on file  Social History Narrative   Married-divorced '89, remarried '96. 2 sons, 1 daughter. 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  He wishes to be a full resuscitation candidate.       Social  Determinants of Health   Financial Resource Strain: Low Risk    Difficulty of Paying Living Expenses: Not hard at all  Food Insecurity: No Food Insecurity   Worried About Charity fundraiser in the Last Year: Never true   [place] in the Last Year: Never true  Transportation Needs: No Transportation Needs   Lack of Transportation (Medical): No   Lack of Transportation (Non-Medical): No  Physical Activity: Inactive   Days of Exercise per Week: 0 days   Minutes of Exercise per Session: 0 min  Stress: No Stress Concern Present   Feeling of Stress : Not at all  Social Connections: Moderately Integrated   Frequency of Communication with Friends and Family: More than three times a week   Frequency of Social Gatherings with Friends and Family: Once a week   Attends Religious Services: More than 4 times per year   Active Member of [employer] or Organizations: No   Attends Music therapist: More than 4 times per year   Marital Status: Divorced    Tobacco Counseling Counseling given: Not Answered   Clinical Intake:  Pre-visit preparation completed: Yes  Pain : No/denies pain Pain Score: 0-No pain     BMI - recorded: 28.23 Nutritional Status: BMI 25 -29 Overweight Nutritional Risks: Other (Comment) Diabetes: Yes CBG done?: No Did pt. bring in CBG monitor from home?: No  How often do you need to have someone help you when you read instructions, pamphlets, or other written materials from your doctor or pharmacy?: 1 - Never What is the last grade level you completed in school?: Associate's degree  Diabetic? yes  Interpreter Needed?: No  Information entered by :: Lisette Abu, LPN   Activities of Daily Living In your present state of health, do you have any difficulty performing the following activities: 12/16/2020 07/12/2020  Hearing? N N  Vision? N N  Difficulty concentrating or making decisions? N N  Walking or climbing stairs? N Y  Comment - Due to  joint pain  Dressing or bathing? N N  Doing errands, shopping? N N  Preparing Food and eating ? N -  Using the Toilet? N -  In the past six months, have you accidently leaked urine? N -  Do you have problems with loss of bowel control? N -  Managing your Medications? N -  Managing your Finances? N -  Housekeeping or managing your Housekeeping? N -  Some recent data might be hidden    Patient Care Team: [name], MD as PCP - General (Internal Medicine) Jessy Oto, MD as Consulting Physician (Orthopedic Surgery) Kathie Rhodes, MD (Inactive) as Consulting Physician (Urology) Clinic, [place] as Referring Physician (General Practice) Irene Shipper, MD as Consulting Physician (Gastroenterology) Knox Royalty, RN as Case Manager  Indicate any recent Medical Services you may have received from other than [facility] providers in the past year (date may be approximate).     Assessment:   This is a routine wellness examination for [place].  Hearing/Vision screen Hearing Screening - Comments:: Patient denied any hearing difficulty.  No hearing aids. Vision Screening - Comments:: Patient wears glasses.  Eye checked annually by [facility].  Dietary issues and exercise activities discussed: Current Exercise Habits: The patient does not participate in regular exercise at present, Exercise limited by: Other - see comments (cancer)  Goals Addressed   None   Depression Screen PHQ 2/9 Scores 12/16/2020 11/22/2020 08/02/2020 05/25/2020 05/20/2020 04/02/2019 01/07/2018  PHQ - 2 Score 0 1 1 0 0 0 0  PHQ- 9 Score - - - - - - -    Fall Risk Fall Risk  12/16/2020 11/22/2020 09/16/2020 08/02/2020 07/14/2019  Falls in the past year? 1 1 1  0 0  Comment - Reports no new falls since last outreach - - -  Number falls in past yr: 0 0 1 0 0  Injury with Fall? 0 0 0 0 0  Comment - - - N/A- no falls reported -  Risk for fall due to : Medication side effect;Impaired balance/gait  Impaired balance/gait;Medication side effect History of fall(s);Other (Comment);Impaired balance/gait No Fall Risks -  Risk for fall due to: Comment - - ongoing vertogo/ dizziness - -  Follow up Falls evaluation completed Falls prevention discussed Falls prevention discussed Falls prevention discussed -    FALL RISK PREVENTION PERTAINING TO THE HOME:  Any stairs in or around the home? Yes  If so, are there any without handrails? No  Home free of loose throw rugs in walkways, pet beds, electrical cords, etc? Yes  Adequate lighting in your home to reduce risk of falls? Yes   ASSISTIVE DEVICES UTILIZED TO PREVENT FALLS:  Life alert? No  Use of a cane, walker or w/c? No  Grab bars in the bathroom? No  Shower chair or bench in shower? No  Elevated toilet seat or a handicapped toilet? No   TIMED UP AND GO:  Was the test performed? Yes .  Length of time to ambulate 10 feet: 5 sec.   Gait steady and fast without use of assistive device  Cognitive Function: Normal cognitive status assessed by direct observation by this Nurse Health Advisor. No abnormalities found.   MMSE - Mini Mental State Exam 08/02/2017 07/07/2015  Orientation to time 5 5  Orientation to Place 5 5  Registration 3 3  Attention/ Calculation 4 1  Recall 2 2  Language- name 2 objects 2 2  Language- repeat 1 1  Language- follow 3 step command 3 3  Language- read & follow direction 1 1  Write a sentence 1 1  Copy design 1 1  Total score 28 25   Montreal Cognitive Assessment  12/26/2018 05/29/2018 10/15/2017  Visuospatial/ Executive (0/5) - 3 5  Naming (0/3) - 3 3  Attention: Read list of digits (0/2) 1 2 2   Attention: Read list of letters (0/1) 1 1 1   Attention: Serial 7 subtraction starting at 100 (0/3) 1 2 3   Language: Repeat phrase (0/2) 0 1 2  Language : Fluency (0/1) 0 0 0  Abstraction (0/2) 0 1 2  Delayed Recall (0/5) 0 0 2  Orientation (0/6) 5 6 6   Total - 19 26      Immunizations Immunization History   Administered Date(s) Administered   Fluad Quad(high Dose 65+) 04/02/2019   Influenza Whole 02/13/2012   Influenza, High Dose Seasonal PF 02/09/2017, 03/04/2018   Influenza-Unspecified 02/13/2015, 03/15/2016, 05/13/2020   PFIZER(Purple Top)SARS-COV-2 Vaccination 11/15/2019, 12/06/2019   Pneumococcal Conjugate-13 12/10/2013   Pneumococcal Polysaccharide-23 01/14/2010, 07/07/2015   Rotavirus Monovalent 01/01/2018   Td 01/29/2008   Tdap 07/18/2011   Zoster, Live 01/14/2010    TDAP status: Up to date  Flu Vaccine status: Up to date  Pneumococcal vaccine status: Up to date  Covid-19 vaccine status: Completed vaccines  Qualifies for Shingles Vaccine? Yes  Zostavax completed Yes   Shingrix Completed?: No.    Education has been provided regarding the importance of this vaccine. Patient has been advised to call insurance company to determine out of pocket expense if they have not yet received this vaccine. Advised may also receive vaccine at local pharmacy or Health Dept. Verbalized acceptance and understanding.  Screening Tests Health Maintenance  Topic Date Due   Zoster Vaccines- Shingrix (1 of 2) Never done   COVID-19 Vaccine (3 - Pfizer risk series) 01/03/2020   OPHTHALMOLOGY EXAM  11/03/2020   INFLUENZA VACCINE  12/13/2020   FOOT EXAM  12/17/2020   HEMOGLOBIN A1C  01/09/2021   COLONOSCOPY (Pts 45-55yrs Insurance coverage will need to be confirmed)  03/14/2021   TETANUS/TDAP  07/17/2021   Hepatitis C Screening  Completed   PNA vac Low Risk Adult  Completed   HPV VACCINES  Aged Out    Health Maintenance  Health Maintenance Due  Topic Date Due   Zoster Vaccines- Shingrix (1 of 2) Never done   COVID-19 Vaccine (3 - Pfizer risk series) 01/03/2020   OPHTHALMOLOGY EXAM  11/03/2020   INFLUENZA VACCINE  12/13/2020    Colorectal cancer screening: Type of screening: Colonoscopy. Completed 01/08/2017. Repeat every 5 years  Lung Cancer Screening: (Low Dose CT Chest recommended  if Age 35-80 years, 30 pack-year currently smoking OR have quit w/in 15years.) does not qualify.   Lung Cancer Screening Referral: no  Additional Screening:  Hepatitis C Screening: does qualify; Completed yes  Vision Screening: Recommended annual ophthalmology exams for early detection of glaucoma and other disorders of the eye. Is the patient up to date with their annual eye exam?  Yes  Who is the provider or what is the name of the office in which the patient attends annual eye exams? [facility] If pt is not established with a provider, would they like to be referred to a provider to establish care? No .   Dental Screening: Recommended annual dental exams for proper oral hygiene  Community Resource Referral / Chronic Care Management: CRR required this visit?  No   CCM required this visit?  No      Plan:     I have personally reviewed and noted the following in the patient's chart:   Medical and social history Use of alcohol, tobacco or illicit drugs  Current medications and supplements including opioid prescriptions. Patient is not currently taking opioid prescriptions. Functional ability and status Nutritional status Physical activity Advanced directives List of other physicians Hospitalizations, surgeries, and ER visits in previous 12 months Vitals Screenings to include cognitive, depression, and falls Referrals and appointments  In addition, I have reviewed and discussed with patient certain preventive protocols, quality metrics, and best practice recommendations. A written personalized care plan for preventive services as well as general preventive health recommendations were provided to patient.     Sheral Flow, LPN   [id number]   Nurse Notes: n/a

## 2020-12-17 ENCOUNTER — Other Ambulatory Visit: Payer: Self-pay | Admitting: Internal Medicine

## 2020-12-24 DIAGNOSIS — C787 Secondary malignant neoplasm of liver and intrahepatic bile duct: Secondary | ICD-10-CM | POA: Diagnosis not present

## 2020-12-24 DIAGNOSIS — C7971 Secondary malignant neoplasm of right adrenal gland: Secondary | ICD-10-CM | POA: Diagnosis not present

## 2020-12-24 DIAGNOSIS — Z85528 Personal history of other malignant neoplasm of kidney: Secondary | ICD-10-CM | POA: Diagnosis not present

## 2020-12-24 DIAGNOSIS — C641 Malignant neoplasm of right kidney, except renal pelvis: Secondary | ICD-10-CM | POA: Diagnosis not present

## 2020-12-28 ENCOUNTER — Other Ambulatory Visit: Payer: Self-pay

## 2020-12-28 ENCOUNTER — Ambulatory Visit (INDEPENDENT_AMBULATORY_CARE_PROVIDER_SITE_OTHER): Payer: Medicare Other | Admitting: Internal Medicine

## 2020-12-28 ENCOUNTER — Encounter: Payer: Self-pay | Admitting: Internal Medicine

## 2020-12-28 VITALS — BP 168/88 | HR 64 | Temp 98.2°F | Resp 16 | Ht 71.0 in | Wt 202.0 lb

## 2020-12-28 DIAGNOSIS — I10 Essential (primary) hypertension: Secondary | ICD-10-CM

## 2020-12-28 DIAGNOSIS — N3281 Overactive bladder: Secondary | ICD-10-CM | POA: Diagnosis not present

## 2020-12-28 DIAGNOSIS — D539 Nutritional anemia, unspecified: Secondary | ICD-10-CM | POA: Diagnosis not present

## 2020-12-28 DIAGNOSIS — I700 Atherosclerosis of aorta: Secondary | ICD-10-CM

## 2020-12-28 LAB — FOLATE: Folate: 15.6 ng/mL (ref >5.9)

## 2020-12-28 LAB — IRON: Iron: 88 ug/dL (ref 42–165)

## 2020-12-28 LAB — FERRITIN: Ferritin: 159.8 ng/mL (ref 22.0–322.0)

## 2020-12-28 LAB — VITAMIN B12: Vitamin B-12: 1179 pg/mL — ABNORMAL HIGH (ref 211–911)

## 2020-12-28 MED ORDER — SOLIFENACIN SUCCINATE 5 MG PO TABS: 5.0000 mg | ORAL_TABLET | Freq: Every day | ORAL | 1 refills | Status: DC

## 2020-12-28 NOTE — Patient Instructions (Signed)
[employer] medicine (25th ed., pp. 1059-1068). [place], [place]: Elsevier.">  Anemia  Anemia is a condition in which there is not enough red blood cells or hemoglobin in the blood. Hemoglobin is a substance in red blood cells thatcarries oxygen. When you do not have enough red blood cells or hemoglobin (are anemic), your body cannot get enough oxygen and your organs may not work properly. Asa result, you may feel very tired or have other problems. What are the causes? Common causes of anemia include: Excessive bleeding. Anemia can be caused by excessive bleeding inside or outside the body, including bleeding from the intestines or from heavy menstrual periods in females. Poor nutrition. Long-lasting (chronic) kidney, thyroid, and liver disease. Bone marrow disorders, spleen problems, and blood disorders. Cancer and treatments for cancer. HIV (human immunodeficiency virus) and AIDS (acquired immunodeficiency syndrome). Infections, medicines, and autoimmune disorders that destroy red blood cells. What are the signs or symptoms? Symptoms of this condition include: Minor weakness. Dizziness. Headache, or difficulties concentrating and sleeping. Heartbeats that feel irregular or faster than normal (palpitations). Shortness of breath, especially with exercise. Pale skin, lips, and nails, or cold hands and feet. Indigestion and nausea. Symptoms may occur suddenly or develop slowly. If your anemia is mild, you maynot have symptoms. How is this diagnosed? This condition is diagnosed based on blood tests, your medical history, and a physical exam. In some cases, a test may be needed in which cells are removed from the soft tissue inside of a bone and looked at under a microscope (bone marrow biopsy). Your health care provider may also check your stool (feces) for blood and may do additional testing to look for the cause of yourbleeding. Other tests may include: Imaging tests, such as a CT scan or  MRI. A procedure to see inside your esophagus and stomach (endoscopy). A procedure to see inside your colon and rectum (colonoscopy). How is this treated? Treatment for this condition depends on the cause. If you continue to lose a lot of blood, you may need to be treated at a hospital. Treatment may include: Taking supplements of iron, vitamin B12, or folic acid. Taking a hormone medicine (erythropoietin) that can help to stimulate red blood cell growth. Having a blood transfusion. This may be needed if you lose a lot of blood. Making changes to your diet. Having surgery to remove your spleen. Follow these instructions at home: Take over-the-counter and prescription medicines only as told by your health care provider. Take supplements only as told by your health care provider. Follow any diet instructions that you were given by your health care provider. Keep all follow-up visits as told by your health care provider. This is important. Contact a health care provider if: You develop new bleeding anywhere in the body. Get help right away if: You are very weak. You are short of breath. You have pain in your abdomen or chest. You are dizzy or feel faint. You have trouble concentrating. You have bloody stools, black stools, or tarry stools. You vomit repeatedly or you vomit up blood. These symptoms may represent a serious problem that is an emergency. Do not wait to see if the symptoms will go away. Get medical help right away. Call your local emergency services (911 in the [place] Do not drive yourself to the hospital. Summary Anemia is a condition in which you do not have enough red blood cells or enough of a substance in your red blood cells that carries oxygen (hemoglobin). Symptoms may occur suddenly   or develop slowly. If your anemia is mild, you may not have symptoms. This condition is diagnosed with blood tests, a medical history, and a physical exam. Other tests may be  needed. Treatment for this condition depends on the cause of the anemia. This information is not intended to replace advice given to you by your health care provider. Make sure you discuss any questions you have with your healthcare provider. Document Revised: 04/08/2019 Document Reviewed: 04/08/2019 Elsevier Patient Education  2022 [employer].

## 2020-12-28 NOTE — Progress Notes (Signed)
0.0   Subjective:  Patient ID: Antonio Adams, male    DOB: June 22, 1945  Age: 75 y.o. MRN: [id number]  CC: Hypertension and Anemia  This visit occurred during the SARS-CoV-2 public health emergency.  Safety protocols were in place, including screening questions prior to the visit, additional usage of staff PPE, and extensive cleaning of exam room while observing appropriate contact time as indicated for disinfecting solutions.    HPI [name] presents for f/up -   His renal function continues to decline.  He tells me his antihypertensives are being managed by a nephrologist.  He denies headache, blurred vision, dizziness, lightheadedness, chest pain, shortness of breath, or edema.  Outpatient Medications Prior to Visit  Medication Sig Dispense Refill   acetaminophen (TYLENOL) 500 MG tablet Take 2 tablets (1,000 mg total) by mouth every 6 (six) hours. 60 tablet 0   amLODipine (NORVASC) 5 MG tablet Take 1 tablet (5 mg total) by mouth daily. 90 tablet 1   aspirin EC 81 MG tablet Take 81 mg by mouth daily. Swallow whole.     atorvastatin (LIPITOR) 40 MG tablet Take 1 tablet (40 mg total) by mouth daily. 90 tablet 1   cholecalciferol (VITAMIN D) 1000 units tablet Take 1,000 Units by mouth daily.     Continuous Blood Gluc Receiver (FREESTYLE LIBRE 14 DAY READER) DEVI 1 Act by Does not apply route daily. 2 each 5   Continuous Blood Gluc Sensor (FREESTYLE LIBRE 2 SENSOR) MISC 1 Act by Does not apply route daily. 2 each 5   dimenhyDRINATE (DRAMAMINE) 50 MG tablet Take 50 mg by mouth every 8 (eight) hours as needed for dizziness (vertigo).     docusate sodium (COLACE) 100 MG capsule Take 1 capsule (100 mg total) by mouth 2 (two) times daily. 60 capsule 0   ferrous sulfate 325 (65 FE) MG tablet Take 1 tablet (325 mg total) by mouth 2 (two) times daily. Patient needs office visit for further refills 60 tablet 1   gabapentin (NEURONTIN) 100 MG capsule Take 100 mg by mouth at bedtime.      gemfibrozil (LOPID) 600 MG tablet Take 600 mg by mouth 2 (two) times daily before a meal.     indapamide (LOZOL) 1.25 MG tablet TAKE 1 TABLET BY MOUTH EVERY DAY 30 tablet 0   Insulin Pen Needle 32G X 6 MM MISC 1 Act by Does not apply route daily. 100 each 1   Omega-3 Fatty Acids (FISH OIL PO) Take 1 tablet by mouth daily.     ONE TOUCH ULTRA TEST test strip USE 2 (TWO) TIMES DAILY. AND LANCETS 2/DAY 250.03 100 each 11   OVER THE COUNTER MEDICATION Take 1 tablet by mouth 2 (two) times daily. Focus factor     polyvinyl alcohol (LIQUIFILM TEARS) 1.4 % ophthalmic solution Place 1 drop into both eyes as needed for dry eyes.     tamsulosin (FLOMAX) 0.4 MG CAPS capsule TAKE 1 CAPSULE (0.4 MG TOTAL) BY MOUTH DAILY. (Patient taking differently: Take 0.4 mg by mouth daily.) 30 capsule 1   tamsulosin (FLOMAX) 0.4 MG CAPS capsule Take by mouth.     traMADol (ULTRAM) 50 MG tablet Take 50-100 mg by mouth every 6 (six) hours as needed for pain.     diltiazem (TIAZAC) 120 MG 24 hr capsule Take 120 mg by mouth daily.     hydrOXYzine (ATARAX/VISTARIL) 25 MG tablet Take 25 mg by mouth 3 (three) times daily as needed for anxiety, itching or  nausea.     insulin glargine (LANTUS SOLOSTAR) 100 UNIT/ML Solostar Pen Inject 30 Units into the skin daily as needed (blood sugar over 200). 9 mL 1   lisinopril (ZESTRIL) 40 MG tablet TAKE ONE-HALF TABLET BY MOUTH EVERY DAY FOR BLOOD PRESSURE     meclizine (ANTIVERT) 25 MG tablet Take 1 tablet (25 mg total) by mouth 3 (three) times daily as needed for dizziness. 20 tablet 0   ondansetron (ZOFRAN ODT) 4 MG disintegrating tablet 4mg  ODT q4 hours prn nausea/vomit 12 tablet 0   Oxymetazoline HCl (VICKS SINEX NA) Place 1 spray into the nose as needed (congestion).     solifenacin (VESICARE) 5 MG tablet Take 1 tablet (5 mg total) by mouth daily. 90 tablet 1   No facility-administered medications prior to visit.    ROS Review of Systems  Constitutional:  Negative for chills,  diaphoresis, fatigue and fever.  HENT: Negative.    Eyes: Negative.   Respiratory:  Negative for cough, chest tightness, shortness of breath and wheezing.   Cardiovascular:  Negative for chest pain, palpitations and leg swelling.  Gastrointestinal:  Negative for abdominal pain, constipation, diarrhea, nausea and vomiting.  Endocrine: Negative.   Genitourinary:  Positive for frequency. Negative for decreased urine volume and difficulty urinating.  Musculoskeletal:  Positive for back pain. Negative for arthralgias, myalgias and neck pain.  Skin:  Negative for color change.  Neurological:  Negative for dizziness, weakness and light-headedness.  Hematological:  Negative for adenopathy. Does not bruise/bleed easily.   Objective:  BP (!) 168/88 (BP Location: Left Arm, Patient Position: Sitting, Cuff Size: Large)   Pulse 64   Temp 98.2 F (36.8 C) (Oral)   Resp 16   Ht 5\' 11"  (1.803 m)   Wt 202 lb (91.6 kg)   SpO2 99%   BMI 28.17 kg/m   BP Readings from Last 3 Encounters:  12/28/20 (!) 168/88  12/16/20 (!) 142/70  09/13/20 (!) 179/98    Wt Readings from Last 3 Encounters:  12/28/20 202 lb (91.6 kg)  12/16/20 202 lb 6.4 oz (91.8 kg)  09/09/20 200 lb 12.8 oz (91.1 kg)    Physical Exam Vitals reviewed.  HENT:     Nose: Nose normal.     Mouth/Throat:     Mouth: Mucous membranes are moist.  Eyes:     General: No scleral icterus. Cardiovascular:     Rate and Rhythm: Normal rate and regular rhythm.     Heart sounds: No murmur heard. Pulmonary:     Effort: Pulmonary effort is normal.     Breath sounds: No stridor. No wheezing, rhonchi or rales.  Abdominal:     General: Abdomen is flat.     Palpations: There is no mass.     Tenderness: There is no abdominal tenderness. There is no guarding or rebound.     Hernia: No hernia is present.  Musculoskeletal:        General: Normal range of motion.     Cervical back: Neck supple.     Right lower leg: No edema.     Left lower leg:  No edema.  Lymphadenopathy:     Cervical: No cervical adenopathy.  Skin:    General: Skin is warm and dry.  Neurological:     General: No focal deficit present.     Mental Status: He is alert and oriented to person, place, and time. Mental status is at baseline.  Psychiatric:        Mood and  Affect: Mood normal.        Behavior: Behavior normal.    Lab Results  Component Value Date   WBC 4.5 12/02/2020   HGB 9.4 (A) 12/02/2020   HCT 28 (A) 12/02/2020   PLT 224 12/02/2020   GLUCOSE 98 09/13/2020   CHOL 176 12/02/2020   TRIG 86 12/02/2020   HDL 51 12/02/2020   LDLCALC 108 12/02/2020   ALT 29 12/02/2020   AST 28 12/02/2020   NA 138 12/02/2020   K 5.0 12/02/2020   CL 109 (A) 12/02/2020   CREATININE 4.4 (A) 12/02/2020   BUN 37 (A) 12/02/2020   CO2 21 12/02/2020   TSH 1.82 04/02/2019   PSA 0.01 (L) 05/20/2020   INR 1.0 07/21/2020   HGBA1C 6.3 12/02/2020   MICROALBUR 97.0 12/18/2019    No results found.  Assessment & Plan:   Trystyn was seen today for hypertension and anemia.  Diagnoses and all orders for this visit:  Deficiency anemia- I will screen him for vitamin deficiencies. -     Vitamin B12; Future -     Iron; Future -     Folate; Future -     Ferritin; Future -     Vitamin B1; Future -     Vitamin B1 -     Ferritin -     Folate -     Iron -     Vitamin B12  OAB (overactive bladder) -     solifenacin (VESICARE) 5 MG tablet; Take 1 tablet (5 mg total) by mouth daily.  Atherosclerosis of aorta ([place])- Risk factor modifications addressed.  Essential hypertension- He tells me that this is being managed by nephrology.  I have discontinued Otila Back. Brewbaker's hydrOXYzine, Lantus SoloStar, diltiazem, Oxymetazoline HCl (VICKS SINEX NA), lisinopril, meclizine, and ondansetron. I am also having him maintain his tamsulosin, cholecalciferol, ONE TOUCH ULTRA TEST, ferrous sulfate, atorvastatin, gabapentin, gemfibrozil, aspirin EC, docusate sodium, acetaminophen,  FreeStyle Libre 14 Day Reader, FreeStyle Libre 2 Sensor, amLODipine, Insulin Pen Needle, traMADol, polyvinyl alcohol, Omega-3 Fatty Acids (FISH OIL PO), OVER THE COUNTER MEDICATION, dimenhyDRINATE, tamsulosin, indapamide, and solifenacin.  Meds ordered this encounter  Medications   solifenacin (VESICARE) 5 MG tablet    Sig: Take 1 tablet (5 mg total) by mouth daily.    Dispense:  90 tablet    Refill:  1     Follow-up: Return in about 3 months (around 03/30/2021).  Scarlette Calico, MD

## 2020-12-30 DIAGNOSIS — I700 Atherosclerosis of aorta: Secondary | ICD-10-CM | POA: Insufficient documentation

## 2020-12-31 DIAGNOSIS — C787 Secondary malignant neoplasm of liver and intrahepatic bile duct: Secondary | ICD-10-CM | POA: Diagnosis not present

## 2020-12-31 DIAGNOSIS — C641 Malignant neoplasm of right kidney, except renal pelvis: Secondary | ICD-10-CM | POA: Diagnosis not present

## 2021-01-04 ENCOUNTER — Telehealth: Payer: Self-pay | Admitting: Oncology

## 2021-01-04 LAB — VITAMIN B1: Vitamin B1 (Thiamine): 71 nmol/L — ABNORMAL HIGH (ref 8–30)

## 2021-01-04 NOTE — Telephone Encounter (Signed)
Received a new pt referral from Dr. Louis Meckel for metastatic kidney cancer. Mr. Blue has been cld and scheduled to see Dr. Alen Blew on 9/1 at 11am. Letter mailed.

## 2021-01-12 DIAGNOSIS — C649 Malignant neoplasm of unspecified kidney, except renal pelvis: Secondary | ICD-10-CM | POA: Diagnosis not present

## 2021-01-12 DIAGNOSIS — D631 Anemia in chronic kidney disease: Secondary | ICD-10-CM | POA: Diagnosis not present

## 2021-01-12 DIAGNOSIS — E1122 Type 2 diabetes mellitus with diabetic chronic kidney disease: Secondary | ICD-10-CM | POA: Diagnosis not present

## 2021-01-12 DIAGNOSIS — E785 Hyperlipidemia, unspecified: Secondary | ICD-10-CM | POA: Diagnosis not present

## 2021-01-12 DIAGNOSIS — N184 Chronic kidney disease, stage 4 (severe): Secondary | ICD-10-CM | POA: Diagnosis not present

## 2021-01-12 DIAGNOSIS — I129 Hypertensive chronic kidney disease with stage 1 through stage 4 chronic kidney disease, or unspecified chronic kidney disease: Secondary | ICD-10-CM | POA: Diagnosis not present

## 2021-01-12 DIAGNOSIS — N189 Chronic kidney disease, unspecified: Secondary | ICD-10-CM | POA: Diagnosis not present

## 2021-01-13 ENCOUNTER — Inpatient Hospital Stay: Payer: Medicare Other | Attending: Oncology | Admitting: Oncology

## 2021-01-13 ENCOUNTER — Other Ambulatory Visit: Payer: Self-pay

## 2021-01-13 VITALS — BP 147/84 | HR 67 | Temp 98.2°F | Resp 20 | Wt 200.6 lb

## 2021-01-13 DIAGNOSIS — C641 Malignant neoplasm of right kidney, except renal pelvis: Secondary | ICD-10-CM | POA: Diagnosis not present

## 2021-01-13 DIAGNOSIS — C7989 Secondary malignant neoplasm of other specified sites: Secondary | ICD-10-CM | POA: Diagnosis not present

## 2021-01-13 DIAGNOSIS — Z905 Acquired absence of kidney: Secondary | ICD-10-CM

## 2021-01-13 DIAGNOSIS — C61 Malignant neoplasm of prostate: Secondary | ICD-10-CM

## 2021-01-13 DIAGNOSIS — C787 Secondary malignant neoplasm of liver and intrahepatic bile duct: Secondary | ICD-10-CM

## 2021-01-13 MED ORDER — PROCHLORPERAZINE MALEATE 10 MG PO TABS: 10.0000 mg | ORAL_TABLET | Freq: Four times a day (QID) | ORAL | 0 refills | Status: DC | PRN

## 2021-01-13 NOTE — Progress Notes (Signed)
Reason for the request:    Kidney cancer  HPI: I was asked by Dr. Louis Meckel to evaluate Antonio Adams for the evaluation of kidney cancer.  He is a 75 year old man diagnosed with kidney cancer in 2017.  He underwent right partial nephrectomy and found to have stage T1b 6 cm papillary tumor at that time.  He completed right adrenalectomy and metastatic resection of a recurrent kidney tumor right retroperitoneum completed in October 2021 and care of Dr. Louis Meckel.  The final pathology at that time confirmed the presence of papillary renal cell carcinoma.  He developed recurrent disease in March 2022 after recurrence including the adrenal metastasis as well as hepatic involvement.  He underwent percutaneous microwave ablation of the adrenal gland as well as hepatic metastasis completed in March 2022 under the care of Dr. Laurence Ferrari he has remained on active surveillance and developed recurrent disease in May 2022.  He completed radiation therapy to the posterior medial liver capsule completed on Oct 07, 2020.  He received 50 Gray in 5 fractions at that time.  CT scan obtained on December 24, 2020 which showed response to therapy around the liver however the adrenal gland appears to have enlarged as well as the potential new areas of metastasis.  Clinically, he reports feeling well without any complaints.  He denies any nausea, abdominal pain or hematuria.  He remains active and attends activities of daily living.  He does not report any headaches, blurry vision, syncope or seizures. Does not report any fevers, chills or sweats.  Does not report any cough, wheezing or hemoptysis.  Does not report any chest pain, palpitation, orthopnea or leg edema.  Does not report any nausea, vomiting or abdominal pain.  Does not report any constipation or diarrhea.  Does not report any skeletal complaints.    Does not report frequency, urgency or hematuria.  Does not report any skin rashes or lesions. Does not report any heat or  cold intolerance.  Does not report any lymphadenopathy or petechiae.  Does not report any anxiety or depression.  Remaining review of systems is negative.     Past Medical History:  Diagnosis Date   ADENOCARCINOMA, PROSTATE, GLEASON GRADE 6 12/21/2009   prostate cancer   ALLERGIC RHINITIS 03/16/2007   Allergy    Anxiety    Arthritis    back    BACK PAIN WITH RADICULOPATHY 11/23/2008   Cancer of kidney ([place])    partial right kidney removed   CARPAL TUNNEL SYNDROME, BILATERAL 03/16/2007   Chronic back pain    Constipation    takes Senokot daily   Depression    occasionally   DIABETES MELLITUS, TYPE II 03/16/2007   takes Januvia and MEtformin daily   GASTROINTESTINAL HEMORRHAGE, HX OF 03/16/2007   GERD 03/16/2007   takes Omeprazole daily   Glaucoma    mild - no eye drops   Hemorrhoids    History of colon polyps    HYPERLIPIDEMIA 03/16/2007   takes Crestor daily   HYPERTENSION 03/16/2007   takes Diltiazem and Lisinopril daily    Kidney cancer, primary, with metastasis from kidney to other site [facility])    LEG CRAMPS 05/22/2007   Nocturia    Overactive bladder    PARESTHESIA 05/21/2007   Proctitis    PTSD (post-traumatic stress disorder)    wakes up may be dreaming of fighting   Rectal bleeding    Dr.Norins has explained its from the Radiation that he has received   Rectal bleeding  Renal cell carcinoma ([facility])    Sleep apnea    uses CPAP nightly   Stomach cancer ([place])    Urinary frequency    takes Toviaz daily  :   Past Surgical History:  Procedure Laterality Date   ADRENALECTOMY Right 02/26/2020   Procedure: OPEN RIGHT RENAL CELL METASTATECTOMY AND  ADRENALECTOMY;  Surgeon: [name], MD;  Location: [facility];  Service: Urology;  Laterality: Right;   [place], 2008   repeat surgery; ESI '08   [place]     bilateral   CERVICAL FUSION     COLONOSCOPY     colonosocpy     ESOPHAGOGASTRODUODENOSCOPY     ED   FLEXIBLE SIGMOIDOSCOPY N/A 01/08/2017    Procedure: FLEXIBLE SIGMOIDOSCOPY;  Surgeon: Irene Shipper, MD;  Location: [place];  Service: Endoscopy;  Laterality: N/A;   HOT HEMOSTASIS N/A 01/08/2017   Procedure: HOT HEMOSTASIS (ARGON PLASMA COAGULATION/BICAP);  Surgeon: Irene Shipper, MD;  Location: [facility];  Service: Endoscopy;  Laterality: N/A;   IR RADIOLOGIST EVAL & MGMT  12/17/2019   IR RADIOLOGIST EVAL & MGMT  07/01/2020   IR RADIOLOGIST EVAL & MGMT  08/25/2020   LUMBAR LAMINECTOMY  10/30/2011   Procedure: MICRODISCECTOMY LUMBAR LAMINECTOMY;  Surgeon: Jessy Oto, MD;  Location: [place];  Service: Orthopedics;  Laterality: Right;  Right L4-5 and L5-S1 Microdiscectomy   LUMBAR LAMINECTOMY  06/17/2012   Procedure: MICRODISCECTOMY LUMBAR LAMINECTOMY;  Surgeon: Jessy Oto, MD;  Location: [place];  Service: Orthopedics;  Laterality: N/A;  Right L5-S1 microdiscectomy   OPEN PARTIAL HEPATECTOMY  N/A 02/26/2020   Procedure: OPEN PARTIAL HEPATECTOMY, CHOLECYSTECTOMY;  Surgeon: Stark Klein, MD;  Location: [facility];  Service: General;  Laterality: N/A;   POLYPECTOMY     PROSTATE SURGERY  march 2012   seed implant   RADIOFREQUENCY ABLATION N/A 07/21/2020   Procedure: CT MICROWAVE ABLATION;  Surgeon: Criselda Peaches, MD;  Location: [facility];  Service: Anesthesiology;  Laterality: N/A;   ROBOTIC ASSITED PARTIAL NEPHRECTOMY Right 09/08/2015   Procedure: XI ROBOTIC ASSITED RIGHT PARTIAL NEPHRECTOMY;  Surgeon: [name], MD;  Location: [facility];  Service: Urology;  Laterality: Right;  Clamp on: 1033 Clamp off: 1103 Total Clamp Time: 30 minutes   SHOULDER ARTHROSCOPY WITH SUBACROMIAL DECOMPRESSION Left 01/20/2016   Procedure: LEFT SHOULDER ARTHROSCOPY WITH EXTENSIVE  DEBRIDEMENT, ACROMIOPLASTY;  Surgeon: Ninetta Lights, MD;  Location: [place];  Service: Orthopedics;  Laterality: Left;   SHOULDER SURGERY  LFT   Stress Cardiolite  12/24/2003   negative for ischemia   UPPER ESOPHAGEAL ENDOSCOPIC ULTRASOUND (EUS)   04/18/2016   [facility]   UPPER GASTROINTESTINAL ENDOSCOPY     WOUND EXPLORATION     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  :   Current Outpatient Medications:    acetaminophen (TYLENOL) 500 MG tablet, Take 2 tablets (1,000 mg total) by mouth every 6 (six) hours., Disp: 60 tablet, Rfl: 0   amLODipine (NORVASC) 5 MG tablet, Take 1 tablet (5 mg total) by mouth daily., Disp: 90 tablet, Rfl: 1   aspirin EC 81 MG tablet, Take 81 mg by mouth daily. Swallow whole., Disp: , Rfl:    atorvastatin (LIPITOR) 40 MG tablet, Take 1 tablet (40 mg total) by mouth daily., Disp: 90 tablet, Rfl: 1   cholecalciferol (VITAMIN D) 1000 units tablet, Take 1,000 Units by mouth daily., Disp: , Rfl:    Continuous Blood  Gluc Receiver (FREESTYLE LIBRE 14 DAY READER) DEVI, 1 Act by Does not apply route daily., Disp: 2 each, Rfl: 5   Continuous Blood Gluc Sensor (FREESTYLE LIBRE 2 SENSOR) MISC, 1 Act by Does not apply route daily., Disp: 2 each, Rfl: 5   dimenhyDRINATE (DRAMAMINE) 50 MG tablet, Take 50 mg by mouth every 8 (eight) hours as needed for dizziness (vertigo)., Disp: , Rfl:    docusate sodium (COLACE) 100 MG capsule, Take 1 capsule (100 mg total) by mouth 2 (two) times daily., Disp: 60 capsule, Rfl: 0   ferrous sulfate 325 (65 FE) MG tablet, Take 1 tablet (325 mg total) by mouth 2 (two) times daily. Patient needs office visit for further refills, Disp: 60 tablet, Rfl: 1   gabapentin (NEURONTIN) 100 MG capsule, Take 100 mg by mouth at bedtime., Disp: , Rfl:    gemfibrozil (LOPID) 600 MG tablet, Take 600 mg by mouth 2 (two) times daily before a meal., Disp: , Rfl:    indapamide (LOZOL) 1.25 MG tablet, TAKE 1 TABLET BY MOUTH EVERY DAY, Disp: 30 tablet, Rfl: 0   Insulin Pen Needle 32G X 6 MM MISC, 1 Act by Does not apply route daily., Disp: 100 each, Rfl: 1   Omega-3 Fatty Acids (FISH OIL PO), Take 1 tablet by mouth daily., Disp: , Rfl:    ONE TOUCH ULTRA TEST test strip, USE 2 (TWO) TIMES DAILY.  AND LANCETS 2/DAY 250.03, Disp: 100 each, Rfl: 11   OVER THE COUNTER MEDICATION, Take 1 tablet by mouth 2 (two) times daily. Focus factor, Disp: , Rfl:    polyvinyl alcohol (LIQUIFILM TEARS) 1.4 % ophthalmic solution, Place 1 drop into both eyes as needed for dry eyes., Disp: , Rfl:    solifenacin (VESICARE) 5 MG tablet, Take 1 tablet (5 mg total) by mouth daily., Disp: 90 tablet, Rfl: 1   tamsulosin (FLOMAX) 0.4 MG CAPS capsule, TAKE 1 CAPSULE (0.4 MG TOTAL) BY MOUTH DAILY. (Patient taking differently: Take 0.4 mg by mouth daily.), Disp: 30 capsule, Rfl: 1   tamsulosin (FLOMAX) 0.4 MG CAPS capsule, Take by mouth., Disp: , Rfl:    traMADol (ULTRAM) 50 MG tablet, Take 50-100 mg by mouth every 6 (six) hours as needed for pain., Disp: , Rfl: :   Allergies  Allergen Reactions   Ace Inhibitors Swelling and Other (See Comments)   Lisinopril Swelling  :   Family History  Problem Relation Age of Onset   Cancer Sister 29       Cancer, unsure type   Hypertension Other    Colon cancer Neg Hx    Esophageal cancer Neg Hx    Stomach cancer Neg Hx    Diabetes Neg Hx    Colon polyps Neg Hx    Rectal cancer Neg Hx   :   Social History   Socioeconomic History   Marital status: Widowed    Spouse name: Deneise Lever   Number of children: 3   Years of education: 12+   Highest education level: Not on file  Occupational History   Occupation: Event organiser; Banker: RETIRED    Comment: retired from Event organiser  Tobacco Use   Smoking status: Never   Smokeless tobacco: Never  Vaping Use   Vaping Use: Never used  Substance and Sexual Activity   Alcohol use: Not Currently    Alcohol/week: 0.0 standard drinks   Drug use: No   Sexual activity: Not Currently    Partners: Female  Other Topics Concern   Not on file  Social History Narrative   Married-divorced '89, remarried '96. 2 sons, 1 daughter. 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 He wishes to be a full resuscitation candidate.       Social Determinants of Health   Financial Resource Strain: Low Risk    Difficulty of Paying Living Expenses: Not hard at all  Food Insecurity: No Food Insecurity   Worried About Charity fundraiser in the Last Year: Never true   [place] in the Last Year: Never true  Transportation Needs: No Transportation Needs   Lack of Transportation (Medical): No   Lack of Transportation (Non-Medical): No  Physical Activity: Inactive   Days of Exercise per Week: 0 days   Minutes of Exercise per Session: 0 min  Stress: No Stress Concern Present   Feeling of Stress : Not at all  Social Connections: Moderately Integrated   Frequency of Communication with Friends and Family: More than three times a week   Frequency of Social Gatherings with Friends and Family: Once a week   Attends Religious Services: More than 4 times per year   Active Member of [employer] or Organizations: No   Attends Music therapist: More than 4 times per year   Marital Status: Divorced  Human resources officer Violence: Not At Risk   Fear of Current or Ex-Partner: No   Emotionally Abused: No   Physically Abused: No   Sexually Abused: No  :  Pertinent items are noted in HPI.  Exam: Blood pressure (!) 147/84, pulse 67, temperature 98.2 F (36.8 C), resp. rate 20, weight 200 lb 9.6 oz (91 kg), SpO2 100 %.  ECOG 1  General appearance: alert and cooperative appeared without distress. Head: atraumatic without any abnormalities. Eyes: conjunctivae/corneas clear. PERRL.  Sclera anicteric. Throat: lips, mucosa, and tongue normal; without oral thrush or ulcers. Resp: clear to auscultation bilaterally without rhonchi, wheezes or dullness to percussion. Cardio: regular rate and rhythm, S1, S2 normal, no murmur, click, rub or gallop GI: soft, non-tender; bowel sounds normal; no masses,  no organomegaly Skin: Skin color, texture, turgor normal. No rashes  or lesions Lymph nodes: Cervical, supraclavicular, and axillary nodes normal. Neurologic: Grossly normal without any motor, sensory or deep tendon reflexes. Musculoskeletal: No joint deformity or effusion.  IMPRESSION: Limited evaluation due to lack of intravenous contrast administration.  Status post right partial nephrectomy and right adrenalectomy.  Right hepatic metastasis may be mildly improved.  However, soft tissue nodules in the right adrenalectomy bed/hepatorenal fossa are new/progressive.  Brachytherapy seeds in the prostate.   Assessment and Plan:    75 year old with:  1.  Papillary renal cell carcinoma diagnosed in 2017.  He underwent right partial nephrectomy which showed a papillary renal cell carcinoma type II with nuclear grade 3.  He developed recurrent disease in 2021 underwent adrenalectomy and to have received microwave ablation for hepatic metastasis as well as radiation therapy.  CT scan in August 2022 continues to show progressive disease.  Treatment options moving forward were discussed.  Treatment with local therapy versus systemic therapy options were reviewed.  Given his multiple areas of metastasis it would be reasonable to consider systemic therapy.  Given the papillary histology of his kidney cancer, treatment options are more limited.  We will targeted therapy with sunitinib and cabozantinib versus immunotherapy options were discussed.  Single agent nivolumab versus immunotherapy doublets were also discussed.  After discussion, elected to proceed with single agent nivolumab every 4 weeks and update his staging scans after 3 months of therapy.  Complication associated with this treatment include nausea, fatigue and autoimmune issues.  He is agreeable to proceed at this time after education class.  2.  IV access: Peripheral veins will be used at this time.  3.  Antiemetics: Prescription for Compazine will be made available to him.  4.  Autoimmune  complications: These will continue to be monitored on this treatment.  He states this clinic pneumonitis, colitis, dermatitis and thyroid disease will be monitored.  5.  Follow-up: We will be in the near future to start therapy.  60  minutes were dedicated to this visit. The time was spent on reviewing pathology results,, imaging studies, discussing treatment options,  and answering questions regarding future plan.   A copy of this consult has been forwarded to the requesting physician.

## 2021-01-13 NOTE — Progress Notes (Signed)
START ON PATHWAY REGIMEN - Renal Cell ? ? ?  A cycle is every 28 days: ?    Cabozantinib (tablet)  ?    Nivolumab  ? ?**Always confirm dose/schedule in your pharmacy ordering system** ? ?Patient Characteristics: ?Stage IV (Unresected T4M0 or Any T, M1)/Metastatic Disease, Non-Clear Cell, First Line ?Therapeutic Status: Stage IV (Unresected T4M0 or Any T, M1)/Metastatic Disease ?Histology: Non-Clear Cell ?Line of therapy: First Line ?Intent of Therapy: ?Non-Curative / Palliative Intent, Discussed with Patient ?

## 2021-01-14 ENCOUNTER — Telehealth: Payer: Self-pay | Admitting: Oncology

## 2021-01-14 NOTE — Telephone Encounter (Signed)
Left message with follow-up appointments per 9/1 los. 

## 2021-01-19 ENCOUNTER — Inpatient Hospital Stay: Payer: Medicare Other

## 2021-01-19 ENCOUNTER — Other Ambulatory Visit: Payer: Self-pay

## 2021-02-07 ENCOUNTER — Ambulatory Visit (INDEPENDENT_AMBULATORY_CARE_PROVIDER_SITE_OTHER): Payer: Medicare Other | Admitting: *Deleted

## 2021-02-07 DIAGNOSIS — I10 Essential (primary) hypertension: Secondary | ICD-10-CM

## 2021-02-07 DIAGNOSIS — E118 Type 2 diabetes mellitus with unspecified complications: Secondary | ICD-10-CM

## 2021-02-07 NOTE — Patient Instructions (Signed)
Visit [place], it was nice talking with you today.    I look forward to talking to you again for an update on Wednesday May 11, 2021 at 10:00 am- please be listening out for my call that day.  I will call as close to 10:00 am as possible.   If you need to cancel or re-schedule our telephone visit, please call [phone] and one of our care guides will be happy to assist you.   I look forward to hearing about your progress.   Please don't hesitate to contact me if I can be of assistance to you before our next scheduled telephone appointment.   Oneta Rack, RN, BSN, [facility] RN Care Coordination- [place] [phone]: direct office [phone]: mobile   PATIENT GOALS:  Goals Addressed      Patient Self-Care Activities   On track    Timeframe:  Long-Range Goal Priority:  Medium Start Date: 02/07/21                             Expected End Date: 02/07/22                       Patient will self administer medications as prescribed as evidenced by self report/primary caregiver report  Patient will attend all scheduled provider appointments as evidenced by clinician review of documented attendance to scheduled appointments and patient/caregiver report Patient will call pharmacy for medication refills as evidenced by patient report and review of pharmacy fill history as appropriate Patient will call provider office for new concerns or questions as evidenced by review of documented incoming telephone call notes and patient report Patient will continue to check blood sugars at home: continue writing the values down in your notebook: we will review during each of our telephone calls- your blood sugars today look great! Patient will take the blood sugar log to all doctor visits- this will help your doctor know which medications you should be on and how your medications are working  Patient will continue to following a low sugar,  low-carbohydrate, heart healthy, low salt diet Patient will continue to check blood pressures at home and write them down on paper - the blood pressures you reported today are all in good range                  Diabetes Mellitus and Standards of [place] with and managing diabetes (diabetes mellitus) can be complicated. Your diabetes treatment may be managed by a team of health care providers, including: A physician who specializes in diabetes (endocrinologist). You might also have visits with a nurse practitioner or physician assistant. Nurses. A registered dietitian. A certified diabetes care and education specialist. An exercise specialist. A pharmacist. An eye doctor. A foot specialist (podiatrist). A dental care provider. A primary care provider. A mental health care provider. How to manage your diabetes You can do many things to successfully manage your diabetes. Your health care providers will follow guidelines to help you get the best quality of care. Here are general guidelines for your diabetes management plan. Your health care providers may give you more specific instructions. Physical exams When you are diagnosed with diabetes, and each year after that, your health care provider will ask about your medical and family history. You will have a physical exam, which may include: Measuring your height, weight, and body mass index (  BMI). Checking your blood pressure. This will be done at every routine medical visit. Your target blood pressure may vary depending on your medical conditions, your age, and other factors. A thyroid exam. A skin exam. Screening for nerve damage (peripheral neuropathy). This may include checking the pulse in your legs and feet and the level of sensation in your hands and feet. A foot exam to inspect the structure and skin of your feet, including checking for cuts, bruises, redness, blisters, sores, or other problems. Screening for blood  vessel (vascular) problems. This may include checking the pulse in your legs and feet and checking your temperature. Blood tests Depending on your treatment plan and your personal needs, you may have the following tests: Hemoglobin A1C (HbA1C). This test provides information about blood sugar (glucose) control over the previous 2-3 months. It is used to adjust your treatment plan, if needed. This test will be done: At least 2 times a year, if you are meeting your treatment goals. 4 times a year, if you are not meeting your treatment goals or if your goals have changed. Lipid testing, including total cholesterol, LDL and HDL cholesterol, and triglyceride levels. The goal for LDL is less than 100 mg/dL (5.5 mmol/L). If you are at high risk for complications, the goal is less than 70 mg/dL (3.9 mmol/L). The goal for HDL is 40 mg/dL (2.2 mmol/L) or higher for men, and 50 mg/dL (2.8 mmol/L) or higher for women. An HDL cholesterol of 60 mg/dL (3.3 mmol/L) or higher gives some protection against heart disease. The goal for triglycerides is less than 150 mg/dL (8.3 mmol/L). Liver function tests. Kidney function tests. Thyroid function tests.  Dental and eye exams  Visit your dentist two times a year. If you have type 1 diabetes, your health care provider may recommend an eye exam within 5 years after you are diagnosed, and then once a year after your first exam. For children with type 1 diabetes, the health care provider may recommend an eye exam when your child is age 38 or older and has had diabetes for 3-5 years. After the first exam, your child should get an eye exam once a year. If you have type 2 diabetes, your health care provider may recommend an eye exam as soon as you are diagnosed, and then every 1-2 years after your first exam. Immunizations A yearly flu (influenza) vaccine is recommended annually for everyone 6 months or older. This is especially important if you have diabetes. The  pneumonia (pneumococcal) vaccine is recommended for everyone 2 years or older who has diabetes. If you are age 55 or older, you may get the pneumonia vaccine as a series of two separate shots. The hepatitis B vaccine is recommended for adults shortly after being diagnosed with diabetes. Adults and children with diabetes should receive all other vaccines according to age-specific recommendations from the Centers for Disease Control and Prevention (CDC). Mental and emotional health Screening for symptoms of eating disorders, anxiety, and depression is recommended at the time of diagnosis and after as needed. If your screening shows that you have symptoms, you may need more evaluation. You may work with a mental health care provider. Follow these instructions at home: Treatment plan You will monitor your blood glucose levels and may give yourself insulin. Your treatment plan will be reviewed at every medical visit. You and your health care provider will discuss: How you are taking your medicines, including insulin. Any side effects you have. Your blood glucose level  target goals. How often you monitor your blood glucose level. Lifestyle habits, such as activity level and tobacco, alcohol, and substance use. Education Your health care provider will assess how well you are monitoring your blood glucose levels and whether you are taking your insulin and medicines correctly. He or she may refer you to: A certified diabetes care and education specialist to manage your diabetes throughout your life, starting at diagnosis. A registered dietitian who can create and review your personal nutrition plan. An exercise specialist who can discuss your activity level and exercise plan. General instructions Take over-the-counter and prescription medicines only as told by your health care provider. Keep all follow-up visits. This is important. Where to find support There are many diabetes support networks,  including: American Diabetes Association (ADA): diabetes.org Defeat Diabetes Foundation: defeatdiabetes.org Where to find more information American Diabetes Association (ADA): www.diabetes.org Association of Diabetes Care & Education Specialists (ADCES): diabeteseducator.org International Diabetes Federation (IDF): https://www.munoz-bell.org/ Summary Managing diabetes (diabetes mellitus) can be complicated. Your diabetes treatment may be managed by a team of health care providers. Your health care providers follow guidelines to help you get the best quality care. You should have physical exams, blood tests, blood pressure monitoring, immunizations, and screening tests regularly. Stay updated on how to manage your diabetes. Your health care providers may also give you more specific instructions based on your individual health. This information is not intended to replace advice given to you by your health care provider. Make sure you discuss any questions you have with your health care provider. Document Revised: 11/06/2019 Document Reviewed: 11/06/2019 Elsevier Patient Education  [place].   The patient verbalized understanding of instructions, educational materials, and care plan provided today and agreed to receive a mailed copy of patient instructions, educational materials, and care plan Telephone follow up appointment with care management team member scheduled for:  Wednesday May 11, 2021 at 10:00 am The patient has been provided with contact information for the care management team and has been advised to call with any health related questions or concerns  Oneta Rack, RN, BSN, [place] [phone]: direct office [phone]: mobile

## 2021-02-07 NOTE — Chronic Care Management (AMB) (Signed)
Chronic Care Management   CCM RN Visit Note  02/07/2021 Name: Antonio Adams MRN: [id number] DOB: 05/04/1946  Subjective: Antonio Adams is a 75 y.o. year old male who is a primary care patient of [name], MD. The care management team was consulted for assistance with disease management and care coordination needs.    Engaged with patient by telephone for follow up visit in response to provider referral for case management and/or care coordination services.   Consent to Services:  The patient was given information about Chronic Care Management services, agreed to services, and gave verbal consent prior to initiation of services.  Please see initial visit note for detailed documentation.  Patient agreed to services and verbal consent obtained.   Assessment: Review of patient past medical history, allergies, medications, health status, including review of consultants reports, laboratory and other test data, was performed as part of comprehensive evaluation and provision of chronic care management services.   SDOH (Social Determinants of Health) assessments and interventions performed:  SDOH Interventions    Flowsheet Row Most Recent Value  SDOH Interventions   Food Insecurity Interventions Intervention Not Indicated  Housing Interventions Intervention Not Indicated  [Patient reports 02/07/21 in process of moving to [facility]- has sold his home in [place] Interventions Intervention Not Indicated, PTAR  [Patient continues to drive self]       CCM Care Plan Allergies  Allergen Reactions   Ace Inhibitors Swelling and Other (See Comments)   Lisinopril Swelling   Outpatient Encounter Medications as of 02/07/2021  Medication Sig Note   acetaminophen (TYLENOL) 500 MG tablet Take 2 tablets (1,000 mg total) by mouth every 6 (six) hours.    amLODipine (NORVASC) 5 MG tablet Take 1 tablet (5 mg total) by mouth daily. 10/18/2020: 10/18/20: Patient reports this  medication was recently discontinued by renal provider    aspirin EC 81 MG tablet Take 81 mg by mouth daily. Swallow whole.    atorvastatin (LIPITOR) 40 MG tablet Take 1 tablet (40 mg total) by mouth daily. 10/18/2020: 10/18/20: Patient reports taking one half tablet QD- states this is how [place] PCP instructed him to take   cholecalciferol (VITAMIN D) 1000 units tablet Take 1,000 Units by mouth daily.    Continuous Blood Gluc Receiver (FREESTYLE LIBRE 14 DAY READER) DEVI 1 Act by Does not apply route daily.    Continuous Blood Gluc Sensor (FREESTYLE LIBRE 2 SENSOR) MISC 1 Act by Does not apply route daily.    dimenhyDRINATE (DRAMAMINE) 50 MG tablet Take 50 mg by mouth every 8 (eight) hours as needed for dizziness (vertigo). 10/18/2020: 10/18/20: Patient reports takes prn, OTC for vertigo, reports 2-3 times per day   docusate sodium (COLACE) 100 MG capsule Take 1 capsule (100 mg total) by mouth 2 (two) times daily.    ferrous sulfate 325 (65 FE) MG tablet Take 1 tablet (325 mg total) by mouth 2 (two) times daily. Patient needs office visit for further refills    gabapentin (NEURONTIN) 100 MG capsule Take 100 mg by mouth at bedtime.    gemfibrozil (LOPID) 600 MG tablet Take 600 mg by mouth 2 (two) times daily before a meal.    indapamide (LOZOL) 1.25 MG tablet TAKE 1 TABLET BY MOUTH EVERY DAY    Insulin Pen Needle 32G X 6 MM MISC 1 Act by Does not apply route daily.    Omega-3 Fatty Acids (FISH OIL PO) Take 1 tablet by mouth daily.    ONE TOUCH  ULTRA TEST test strip USE 2 (TWO) TIMES DAILY. AND LANCETS 2/DAY 250.03    OVER THE COUNTER MEDICATION Take 1 tablet by mouth 2 (two) times daily. Focus factor 10/18/2020: 10/18/20: Today, patient reports taking QD   polyvinyl alcohol (LIQUIFILM TEARS) 1.4 % ophthalmic solution Place 1 drop into both eyes as needed for dry eyes. 10/18/2020: 10/18/20: Patient reports uses OTC brand, uses occasionally   prochlorperazine (COMPAZINE) 10 MG tablet Take 1 tablet (10 mg total)  by mouth every 6 (six) hours as needed for nausea or vomiting.    solifenacin (VESICARE) 5 MG tablet Take 1 tablet (5 mg total) by mouth daily.    tamsulosin (FLOMAX) 0.4 MG CAPS capsule TAKE 1 CAPSULE (0.4 MG TOTAL) BY MOUTH DAILY. (Patient taking differently: Take 0.4 mg by mouth daily.)    tamsulosin (FLOMAX) 0.4 MG CAPS capsule Take by mouth.    traMADol (ULTRAM) 50 MG tablet Take 50-100 mg by mouth every 6 (six) hours as needed for pain.    No facility-administered encounter medications on file as of 02/07/2021.   Patient Active Problem List   Diagnosis Date Noted   Atherosclerosis of aorta ([place]) 12/30/2020   Vertigo 09/23/2020   Goals of care, counseling/discussion 05/20/2020   Tinnitus aurium, bilateral 05/20/2020   Metastatic renal cell carcinoma ([place]) 02/26/2020   Diabetic polyneuropathy associated with type 2 diabetes mellitus ([place]) 12/18/2019   CKD (chronic kidney disease) stage 3, GFR 30-59 ml/min ([place]) 10/08/2018   Murmur, cardiac 10/08/2018   DDD (degenerative disc disease), cervical 10/08/2018   OAB (overactive bladder) 04/04/2018   Seasonal allergic rhinitis due to pollen 11/01/2016   Microalbuminuria due to type 2 diabetes mellitus ([employer]) 07/27/2016   Varicose veins of both lower extremities 08/06/2015   Varicose veins of lower extremities with ulcer ([place]) 06/16/2015   Internal hemorrhoid, bleeding 08/03/2014   Post traumatic stress disorder (PTSD) 01/29/2014   Deficiency anemia 12/10/2013   Radiation proctitis 06/04/2012   Spinal stenosis of lumbar region with radiculopathy 10/30/2011    Class: Chronic   OSA (obstructive sleep apnea) 11/24/2010   Obesity, Class I, BMI 30-34.9 11/24/2010   Routine general medical examination at a health care facility 11/24/2010   Type II diabetes mellitus with manifestations ([place]) 03/16/2007   Hyperlipidemia with target LDL less than 100 03/16/2007   Essential hypertension 03/16/2007   GERD 03/16/2007   Conditions to be  addressed/monitored:  HTN and DMII  Care Plan : Diabetes Type 2 (Adult)  Updates made by Knox Royalty, RN since 02/07/2021 12:00 AM     Problem: Glycemic Management (Diabetes, Type 2) Deleted 02/07/2021  Priority: Medium     Long-Range Goal: Glycemic Management Optimized Completed 02/07/2021  Start Date: 08/02/2020  Expected End Date: 02/02/2021  Recent Progress: On track  Priority: Medium  Note:   Current Barriers:  Knowledge Deficits related to basic Diabetes pathophysiology and self care/management Self-care deficits: none identified Case Manager Clinical Goal(s):  02/07/21: Care plan goals re-established and extended; care plan converted to new format Over the next 6 months, patient will: demonstrate improved adherence to prescribed treatment plan for diabetes self care/management as evidenced by:  daily monitoring and recording of CBG   adherence to ADA/ carb modified diet  contacting provider for new or worsened symptoms or questions    Care Plan : Hypertension (Adult)  Updates made by Knox Royalty, RN since 02/07/2021 12:00 AM     Problem: Hypertension (Hypertension) Deleted 02/07/2021  Priority: Medium  Long-Range Goal: Hypertension Monitored Completed 02/07/2021  Start Date: 08/02/2020  Expected End Date: 02/02/2021  Recent Progress: On track  Priority: Medium  Note:   Current Barriers:  Knowledge Deficits related to basic understanding of hypertension pathophysiology and self care management Self care deficits: none identified today; barriers include challenges of preparing meals at home for one person (lives alone); multiple chronic conditions to manage, including diabetes and cancer Case Manager Clinical Goal(s): 02/07/21: Care plan goals re-established and extended; care plan converted to new care plan template Over the next 6 months, patient will demonstrate improved adherence to prescribed treatment plan for hypertension as evidenced by  taking all  medications as prescribed monitoring and recording blood pressure at home twice weekly adhering to low sodium/DASH diet       Care Plan : RN Care Manager Plan of Care  Updates made by Knox Royalty, RN since 02/07/2021 12:00 AM     Problem: Chronic Disease Management Needs   Priority: Medium     Long-Range Goal: Development of plan of care for long term chronic disease management   Start Date: 02/07/2021  Expected End Date: 02/07/2022  Priority: Medium  Note:   Current Barriers:  Chronic Disease Management support and education needs related to HTN and DMII  RNCM Clinical Goal(s): Care plan goals re-established and extended 02/07/21: Patient will demonstrate ongoing health management independence for HTN, DMII  through collaboration with RN Care manager, provider, and care team.   Interventions: 1:1 collaboration with primary care provider regarding development and update of comprehensive plan of care as evidenced by provider attestation and co-signature Inter-disciplinary care team collaboration (see longitudinal plan of care) Evaluation of current treatment plan related to  self management and patient's adherence to plan as established by provider  Diabetes:  (Status: New goal.) Lab Results  Component Value Date   HGBA1C 6.3 12/02/2020  Assessed patient's understanding of A1c goal: <6.5% Provided education to patient about basic DM disease process; Reviewed prescribed diet with patient low carbohydrate, low sugar, heart healthy, low salt- confirmed patient following diet "as best as I can" ; Counseled on importance of regular laboratory monitoring as prescribed;        Discussed plans with patient for ongoing care management follow up and provided patient with direct contact information for care management team;      Reviewed scheduled/upcoming provider appointments including: oncology provider 02/22/21 and 04/01/21: these are for new renal cell CA treatments; provided  patient with dates/ times of scheduled appointments, as he was not aware of these appointments; provided phone number to provider office team;         Review of patient status, including review of consultants reports, relevant laboratory and other test results, and medications completed;       Assessed social determinant of health barriers;        Reviewed with patient recent fasting blood sugars at home: he reports general consistent values between 80-127, with one isolated high value of 169; he denies lows < 80; denies signs/ symptoms hypoglycemia Reviewed previously provided education around self-health management of DM and HTN at home: patient will benefit from ongoing reinforcement/ support  Hypertension: (Status: New goal.) Last practice recorded BP readings:  BP Readings from Last 3 Encounters:  01/13/21 (!) 147/84  12/28/20 (!) 168/88  12/16/20 (!) 142/70  Most recent eGFR/CrCl: No results found for: EGFR  No components found for: CRCL  Evaluation of current treatment plan related to hypertension self management and patient's adherence  to plan as established by provider;   Discussed complications of poorly controlled blood pressure such as heart disease, stroke, circulatory complications, vision complications, kidney impairment, sexual dysfunction;  Confirmed patient continues monitoring/ writing down on paper blood pressures at home, weekly Reviewed recent blood pressures at home with patient: reports consistent values between 135-160/ 74-80; reports blood pressure last week at: "152/78" Confirmed patient no longer attending neuro-rehabilitation sessions for vertigo: reports this has resolved and is much better now; value of ongoing regular activity/ exercise discussed with patient Reviewed recent PCP office visit 12/28/20 with patient; he denies questions, reports no clinical or medication concerns Confirmed patient continues to live by himself, remains independent in ADL's and  iADL's Confirmed patient has had no recent new falls since our last outreach 11/22/20- positive reinforcement provided: encouraged patient to stay safe/ cognizant of fall risks as he begins moving to new home in [place]  Patient Goals/Self-Care Activities: Patient will self administer medications as prescribed as evidenced by self report/primary caregiver report  Patient will attend all scheduled provider appointments as evidenced by clinician review of documented attendance to scheduled appointments and patient/caregiver report Patient will call pharmacy for medication refills as evidenced by patient report and review of pharmacy fill history as appropriate Patient will call provider office for new concerns or questions as evidenced by review of documented incoming telephone call notes and patient report Patient will continue to check blood sugars at home: continue writing the values down in your notebook: we will review during each of our telephone calls- your blood sugars today look great! Patient will take the blood sugar log to all doctor visits- this will help your doctor know which medications you should be on and how your medications are working  Patient will continue to following a low sugar, low-carbohydrate, heart healthy, low salt diet Patient will continue to check blood pressures at home and write them down on paper - the blood pressures you reported today are all in good range      Plan: Telephone follow up appointment with care management team member scheduled for:  Wednesday May 11, 2021 at 10:00 am The patient has been provided with contact information for the care management team and has been advised to call with any health related questions or concerns   Oneta Rack, RN, BSN, [place] [phone]: direct office [phone]: mobile

## 2021-02-11 DIAGNOSIS — E118 Type 2 diabetes mellitus with unspecified complications: Secondary | ICD-10-CM | POA: Diagnosis not present

## 2021-02-11 DIAGNOSIS — I10 Essential (primary) hypertension: Secondary | ICD-10-CM | POA: Diagnosis not present

## 2021-02-21 ENCOUNTER — Encounter: Payer: Self-pay | Admitting: Oncology

## 2021-02-21 NOTE — Progress Notes (Signed)
Called pt to introduce myself as his Arboriculturist, discuss copay assistance and the [employer].  Pt stated he thinks he has met his deductible and his ins should be paying for his treatment at 100% so copay assistance isn't needed at this time.  I informed him of the [employer] and went over what it covers but pt declined it.  I will request for the registration staff give him my card in case his ins leaves him a balance for [facility] and if he changes his mind about applying for the [place] at a later time.

## 2021-02-22 ENCOUNTER — Other Ambulatory Visit: Payer: Self-pay

## 2021-02-22 ENCOUNTER — Inpatient Hospital Stay: Payer: Medicare Other

## 2021-02-22 ENCOUNTER — Inpatient Hospital Stay: Payer: Medicare Other | Attending: Oncology

## 2021-02-22 ENCOUNTER — Inpatient Hospital Stay: Payer: Medicare Other | Admitting: Oncology

## 2021-02-22 VITALS — BP 150/84 | HR 88 | Temp 98.4°F | Resp 17 | Ht 71.0 in | Wt 206.0 lb

## 2021-02-22 DIAGNOSIS — Z79899 Other long term (current) drug therapy: Secondary | ICD-10-CM | POA: Insufficient documentation

## 2021-02-22 DIAGNOSIS — C61 Malignant neoplasm of prostate: Secondary | ICD-10-CM | POA: Diagnosis not present

## 2021-02-22 DIAGNOSIS — C641 Malignant neoplasm of right kidney, except renal pelvis: Secondary | ICD-10-CM

## 2021-02-22 DIAGNOSIS — C787 Secondary malignant neoplasm of liver and intrahepatic bile duct: Secondary | ICD-10-CM | POA: Diagnosis not present

## 2021-02-22 DIAGNOSIS — Z5112 Encounter for antineoplastic immunotherapy: Secondary | ICD-10-CM | POA: Diagnosis not present

## 2021-02-22 DIAGNOSIS — Z905 Acquired absence of kidney: Secondary | ICD-10-CM | POA: Insufficient documentation

## 2021-02-22 LAB — CMP (CANCER CENTER ONLY)
ALT: 13 U/L (ref 0–44)
AST: 15 U/L (ref 15–41)
Albumin: 3.5 g/dL (ref 3.5–5.0)
Alkaline Phosphatase: 119 U/L (ref 38–126)
Anion gap: 7 (ref 5–15)
BUN: 44 mg/dL — ABNORMAL HIGH (ref 8–23)
CO2: 18 mmol/L — ABNORMAL LOW (ref 22–32)
Calcium: 9.9 mg/dL (ref 8.9–10.3)
Chloride: 111 mmol/L (ref 98–111)
Creatinine: 4.95 mg/dL (ref 0.61–1.24)
GFR, Estimated: 12 mL/min — ABNORMAL LOW (ref >60)
Glucose, Bld: 125 mg/dL — ABNORMAL HIGH (ref 70–99)
Potassium: 5.8 mmol/L — ABNORMAL HIGH (ref 3.5–5.1)
Sodium: 136 mmol/L (ref 135–145)
Total Bilirubin: <0.2 mg/dL — ABNORMAL LOW (ref 0.3–1.2)
Total Protein: 6.9 g/dL (ref 6.5–8.1)

## 2021-02-22 LAB — CBC WITH DIFFERENTIAL (CANCER CENTER ONLY)
Abs Immature Granulocytes: 0.01 10*3/uL (ref 0.00–0.07)
Basophils Absolute: 0 10*3/uL (ref 0.0–0.1)
Basophils Relative: 1 %
Eosinophils Absolute: 0.1 10*3/uL (ref 0.0–0.5)
Eosinophils Relative: 2 %
HCT: 23.2 % — ABNORMAL LOW (ref 39.0–52.0)
Hemoglobin: 8 g/dL — ABNORMAL LOW (ref 13.0–17.0)
Immature Granulocytes: 0 %
Lymphocytes Relative: 20 %
Lymphs Abs: 0.6 10*3/uL — ABNORMAL LOW (ref 0.7–4.0)
MCH: 30.8 pg (ref 26.0–34.0)
MCHC: 34.5 g/dL (ref 30.0–36.0)
MCV: 89.2 fL (ref 80.0–100.0)
Monocytes Absolute: 0.5 10*3/uL (ref 0.1–1.0)
Monocytes Relative: 15 %
Neutro Abs: 1.9 10*3/uL (ref 1.7–7.7)
Neutrophils Relative %: 62 %
Platelet Count: 185 10*3/uL (ref 150–400)
RBC: 2.6 MIL/uL — ABNORMAL LOW (ref 4.22–5.81)
RDW: 13.8 % (ref 11.5–15.5)
WBC Count: 3 10*3/uL — ABNORMAL LOW (ref 4.0–10.5)
nRBC: 0 % (ref 0.0–0.2)

## 2021-02-22 LAB — TSH: TSH: 1.245 u[IU]/mL (ref 0.320–4.118)

## 2021-02-22 MED ORDER — SODIUM CHLORIDE 0.9 % IV SOLN
480.0000 mg | Freq: Once | INTRAVENOUS | Status: AC
  Administered 2021-02-22: 480 mg via INTRAVENOUS
  Filled 2021-02-22: qty 48

## 2021-02-22 MED ORDER — SODIUM CHLORIDE 0.9 % IV SOLN: Freq: Once | INTRAVENOUS | Status: AC

## 2021-02-22 NOTE — Progress Notes (Signed)
OK to treat w/ Nivolumab w/ Creatinine 4.95 per Dr. Alen Blew

## 2021-02-22 NOTE — Progress Notes (Signed)
Hematology and Oncology Follow Up Visit  Antonio Adams [id number] Dec 30, 1945 75 y.o. 02/22/2021 12:56 PM Antonio Adams, MDJones, [name], MD   Principle Diagnosis: 75 year old with papillary renal cell carcinoma diagnosed in 2017.  He has metastatic disease with hepatic metastasis in 2021 with recurrent disease in 2022.   Prior Therapy:  He underwent Adams partial nephrectomy and found to have stage T1b 6 cm papillary tumor at that time.    He completed Adams adrenalectomy and metastatic resection of a recurrent kidney tumor Adams retroperitoneum completed in October 2021 and care of Dr. Louis Meckel.  The final pathology at that time confirmed the presence of papillary renal cell carcinoma.   He developed recurrent disease in March 2022 after recurrence including the adrenal metastasis as well as hepatic involvement.  He underwent percutaneous microwave ablation of the adrenal gland as well as hepatic metastasis completed in March 2022 under the care of Dr. Laurence Ferrari.   Current therapy: Nivolumab 480 mg every 4 weeks started on February 22, 2021.  Interim History: Antonio Adams returns today for a follow-up visit.  Since the last visit, he reports no major changes in his health.  He denies any recent hospitalization or illnesses.  He denies any chest pain shortness of breath or difficulty breathing.  He denies any cough or wheezing.  His performance status and quality of life remained excellent.     Medications: Reviewed without any changes. Current Outpatient Medications  Medication Sig Dispense Refill   acetaminophen (TYLENOL) 500 MG tablet Take 2 tablets (1,000 mg total) by mouth every 6 (six) hours. 60 tablet 0   amLODipine (NORVASC) 5 MG tablet Take 1 tablet (5 mg total) by mouth daily. 90 tablet 1   aspirin EC 81 MG tablet Take 81 mg by mouth daily. Swallow whole.     atorvastatin (LIPITOR) 40 MG tablet Take 1 tablet (40 mg total) by mouth daily. 90 tablet 1    cholecalciferol (VITAMIN D) 1000 units tablet Take 1,000 Units by mouth daily.     Continuous Blood Gluc Receiver (FREESTYLE LIBRE 14 DAY READER) DEVI 1 Act by Does not apply route daily. 2 each 5   Continuous Blood Gluc Sensor (FREESTYLE LIBRE 2 SENSOR) MISC 1 Act by Does not apply route daily. 2 each 5   dimenhyDRINATE (DRAMAMINE) 50 MG tablet Take 50 mg by mouth every 8 (eight) hours as needed for dizziness (vertigo).     docusate sodium (COLACE) 100 MG capsule Take 1 capsule (100 mg total) by mouth 2 (two) times daily. 60 capsule 0   ferrous sulfate 325 (65 FE) MG tablet Take 1 tablet (325 mg total) by mouth 2 (two) times daily. Patient needs office visit for further refills 60 tablet 1   gabapentin (NEURONTIN) 100 MG capsule Take 100 mg by mouth at bedtime.     gemfibrozil (LOPID) 600 MG tablet Take 600 mg by mouth 2 (two) times daily before a meal.     indapamide (LOZOL) 1.25 MG tablet TAKE 1 TABLET BY MOUTH EVERY DAY 30 tablet 0   Insulin Pen Needle 32G X 6 MM MISC 1 Act by Does not apply route daily. 100 each 1   Omega-3 Fatty Acids (FISH OIL PO) Take 1 tablet by mouth daily.     ONE TOUCH ULTRA TEST test strip USE 2 (TWO) TIMES DAILY. AND LANCETS 2/DAY 250.03 100 each 11   OVER THE COUNTER MEDICATION Take 1 tablet by mouth 2 (two) times daily. Focus factor  polyvinyl alcohol (LIQUIFILM TEARS) 1.4 % ophthalmic solution Place 1 drop into both eyes as needed for dry eyes.     prochlorperazine (COMPAZINE) 10 MG tablet Take 1 tablet (10 mg total) by mouth every 6 (six) hours as needed for nausea or vomiting. 30 tablet 0   solifenacin (VESICARE) 5 MG tablet Take 1 tablet (5 mg total) by mouth daily. 90 tablet 1   tamsulosin (FLOMAX) 0.4 MG CAPS capsule TAKE 1 CAPSULE (0.4 MG TOTAL) BY MOUTH DAILY. (Patient taking differently: Take 0.4 mg by mouth daily.) 30 capsule 1   tamsulosin (FLOMAX) 0.4 MG CAPS capsule Take by mouth.     traMADol (ULTRAM) 50 MG tablet Take 50-100 mg by mouth every 6  (six) hours as needed for pain.     No current facility-administered medications for this visit.     Allergies:  Allergies  Allergen Reactions   Ace Inhibitors Swelling and Other (See Comments)   Lisinopril Swelling     Physical Exam: Blood pressure (!) 150/84, pulse 88, temperature 98.4 F (36.9 C), temperature source Tympanic, resp. rate 17, height 5\' 11"  (1.803 m), weight 206 lb (93.4 kg), SpO2 100 %.  ECOG:    General appearance: Comfortable appearing without any discomfort Head: Normocephalic without any trauma Oropharynx: Mucous membranes are moist and pink without any thrush or ulcers. Eyes: Pupils are equal and round reactive to light. Lymph nodes: No cervical, supraclavicular, inguinal or axillary lymphadenopathy.   Heart:regular rate and rhythm.  S1 and S2 without leg edema. Lung: Clear without any rhonchi or wheezes.  No dullness to percussion. Abdomin: Soft, nontender, nondistended with good bowel sounds.  No hepatosplenomegaly. Musculoskeletal: No joint deformity or effusion.  Full range of motion noted. Neurological: No deficits noted on motor, sensory and deep tendon reflex exam. Skin: No petechial rash or dryness.  Appeared moist.     Lab Results: Lab Results  Component Value Date   WBC 4.5 12/02/2020   HGB 9.4 (A) 12/02/2020   HCT 28 (A) 12/02/2020   MCV 88.3 09/13/2020   PLT 224 12/02/2020     Chemistry      Component Value Date/Time   NA 138 12/02/2020 0000   K 5.0 12/02/2020 0000   CL 109 (A) 12/02/2020 0000   CO2 21 12/02/2020 0000   BUN 37 (A) 12/02/2020 0000   CREATININE 4.4 (A) 12/02/2020 0000   CREATININE 4.29 (H) 09/13/2020 0846   CREATININE 3.93 (HH) 09/09/2020 1407   CREATININE 2.11 (H) 12/18/2019 1425   GLU 152 12/02/2020 0000      Component Value Date/Time   CALCIUM 10.0 09/13/2020 0846   ALKPHOS 152 (A) 12/02/2020 0000   AST 28 12/02/2020 0000   AST 19 09/09/2020 1407   ALT 29 12/02/2020 0000   ALT 18 09/09/2020 1407    BILITOT 0.4 09/13/2020 0846   BILITOT <0.2 (L) 09/09/2020 1407         Impression and Plan:   75 year old with:  1.  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  He was initially diagnosed in 2017 with localized disease.   Treatment choices were reiterated at this time.  Single agent nivolumab versus combination with cabozantinib were reiterated.  Risks and benefits of both treatment were discussed.  Immune mediated issues as well as GI toxicity were reviewed.  He is agreeable to proceed.  We will update his staging scans in 3 months and potentially adding cabozantinib at that time.   2.  IV access: No issues reported with peripheral veins.   3.  Antiemetics: Compazine is available to him without any issue.   4.  Autoimmune complications: I continue to educate him about potential pneumonitis, colitis and thyroid disease.   5.  Anemia: Appears to be multifactorial in nature related to chronic kidney disease and possible GI blood losses.  She currently on iron supplement and will continue to monitor.   6.  Follow-up: In 4 weeks for repeat follow-up.   30  minutes were dedicated to this encounter.  The time was dedicated to reviewing laboratory data, disease status update, treatment choices and future plan of care discussion.     Zola Button, MD 10/11/202212:56 PM

## 2021-02-22 NOTE — Progress Notes (Signed)
CRITICAL VALUE STICKER  CRITICAL VALUE: creatnine 4.95  RECEIVER (on-site recipient of call): Velna Ochs RN  DATE & TIME NOTIFIED: 02/22/21 1327  MESSENGER (representative from lab): Pam  MD NOTIFIED: Dr. Alen Blew  TIME OF NOTIFICATION: 1330  RESPONSE:  MD aware

## 2021-02-22 NOTE — Patient Instructions (Signed)
[facility] ONCOLOGY  Discharge Instructions: Thank you for choosing [facility] to provide your oncology and hematology care.   If you have a lab appointment with the [facility], please go directly to the [facility] and check in at the registration area.   Wear comfortable clothing and clothing appropriate for easy access to any Portacath or PICC line.   We strive to give you quality time with your provider. You may need to reschedule your appointment if you arrive late (15 or more minutes).  Arriving late affects you and other patients whose appointments are after yours.  Also, if you miss three or more appointments without notifying the office, you may be dismissed from the clinic at the provider's discretion.      For prescription refill requests, have your pharmacy contact our office and allow 72 hours for refills to be completed.    Today you received the following chemotherapy and/or immunotherapy agents   Nivolumab (Opdivo)    To help prevent nausea and vomiting after your treatment, we encourage you to take your nausea medication as directed.  BELOW ARE SYMPTOMS THAT SHOULD BE REPORTED IMMEDIATELY: *FEVER GREATER THAN 100.4 F (38 C) OR HIGHER *CHILLS OR SWEATING *NAUSEA AND VOMITING THAT IS NOT CONTROLLED WITH YOUR NAUSEA MEDICATION *UNUSUAL SHORTNESS OF BREATH *UNUSUAL BRUISING OR BLEEDING *URINARY PROBLEMS (pain or burning when urinating, or frequent urination) *BOWEL PROBLEMS (unusual diarrhea, constipation, pain near the anus) TENDERNESS IN MOUTH AND THROAT WITH OR WITHOUT PRESENCE OF ULCERS (sore throat, sores in mouth, or a toothache) UNUSUAL RASH, SWELLING OR PAIN  UNUSUAL VAGINAL DISCHARGE OR ITCHING   Items with * indicate a potential emergency and should be followed up as soon as possible or go to the Emergency Department if any problems should occur.  Please show the CHEMOTHERAPY ALERT CARD or IMMUNOTHERAPY ALERT CARD at  check-in to the Emergency Department and triage nurse.  Should you have questions after your visit or need to cancel or reschedule your appointment, please contact [facility]  Dept: [phone]  and follow the prompts.  Office hours are 8:00 a.m. to 4:30 p.m. Monday - Friday. Please note that voicemails left after 4:00 p.m. may not be returned until the following business day.  We are closed weekends and major holidays. You have access to a nurse at all times for urgent questions. Please call the main number to the clinic Dept: [phone] and follow the prompts.   For any non-urgent questions, you may also contact your provider using MyChart. We now offer e-Visits for anyone 57 and older to request care online for non-urgent symptoms. For details visit [email or web address].   Also download the MyChart app! Go to the app store, search "MyChart", open the app, select [facility], and log in with your MyChart username and password.  Due to Covid, a mask is required upon entering the hospital/clinic. If you do not have a mask, one will be given to you upon arrival. For doctor visits, patients may have 1 support person aged 41 or older with them. For treatment visits, patients cannot have anyone with them due to current Covid guidelines and our immunocompromised population.    Nivolumab (Opdivo) injection What is this medication? NIVOLUMAB (nye VOL ue mab) is a monoclonal antibody. It treats certain types of cancer. Some of the cancers treated are colon cancer, head and neck cancer, Hodgkin lymphoma, lung cancer, and melanoma. This medicine may be used for other  purposes; ask your health care provider or pharmacist if you have questions. COMMON BRAND NAME(S): Opdivo What should I tell my care team before I take this medication? They need to know if you have any of these conditions: autoimmune diseases like Crohn's disease, ulcerative colitis, or lupus have had or  planning to have an allogeneic stem cell transplant (uses someone else's stem cells) history of chest radiation history of organ transplant nervous system problems like myasthenia gravis or Guillain-Barre syndrome an unusual or allergic reaction to nivolumab, other medicines, foods, dyes, or preservatives pregnant or trying to get pregnant breast-feeding How should I use this medication? This medicine is for infusion into a vein. It is given by a health care professional in a hospital or clinic setting. A special MedGuide will be given to you before each treatment. Be sure to read this information carefully each time. Talk to your pediatrician regarding the use of this medicine in children. While this drug may be prescribed for children as young as 12 years for selected conditions, precautions do apply. Overdosage: If you think you have taken too much of this medicine contact a poison control center or emergency room at once. NOTE: This medicine is only for you. Do not share this medicine with others. What if I miss a dose? It is important not to miss your dose. Call your doctor or health care professional if you are unable to keep an appointment. What may interact with this medication? Interactions have not been studied. This list may not describe all possible interactions. Give your health care provider a list of all the medicines, herbs, non-prescription drugs, or dietary supplements you use. Also tell them if you smoke, drink alcohol, or use illegal drugs. Some items may interact with your medicine. What should I watch for while using this medication? This drug may make you feel generally unwell. Continue your course of treatment even though you feel ill unless your doctor tells you to stop. You may need blood work done while you are taking this medicine. Do not become pregnant while taking this medicine or for 5 months after stopping it. Women should inform their doctor if they wish to  become pregnant or think they might be pregnant. There is a potential for serious side effects to an unborn child. Talk to your health care professional or pharmacist for more information. Do not breast-feed an infant while taking this medicine or for 5 months after stopping it. What side effects may I notice from receiving this medication? Side effects that you should report to your doctor or health care professional as soon as possible: allergic reactions like skin rash, itching or hives, swelling of the face, lips, or tongue breathing problems blood in the urine bloody or watery diarrhea or black, tarry stools changes in emotions or moods changes in vision chest pain cough dizziness feeling faint or lightheaded, falls fever, chills headache with fever, neck stiffness, confusion, loss of memory, sensitivity to light, hallucination, loss of contact with reality, or seizures joint pain mouth sores redness, blistering, peeling or loosening of the skin, including inside the mouth severe muscle pain or weakness signs and symptoms of high blood sugar such as dizziness; dry mouth; dry skin; fruity breath; nausea; stomach pain; increased hunger or thirst; increased urination signs and symptoms of kidney injury like trouble passing urine or change in the amount of urine signs and symptoms of liver injury like dark yellow or brown urine; general ill feeling or flu-like symptoms; light-colored stools; loss  of appetite; nausea; right upper belly pain; unusually weak or tired; yellowing of the eyes or skin swelling of the ankles, feet, hands trouble passing urine or change in the amount of urine unusually weak or tired weight gain or loss Side effects that usually do not require medical attention (report to your doctor or health care professional if they continue or are bothersome): bone pain constipation decreased appetite diarrhea muscle pain nausea, vomiting tiredness This list may not  describe all possible side effects. Call your doctor for medical advice about side effects. You may report side effects to FDA at 1-800-FDA-1088. Where should I keep my medication? This drug is given in a hospital or clinic and will not be stored at home. NOTE: This sheet is a summary. It may not cover all possible information. If you have questions about this medicine, talk to your doctor, pharmacist, or health care provider.  2022 Elsevier/Gold Standard (2019-09-03 10:08:25)

## 2021-03-07 DIAGNOSIS — I129 Hypertensive chronic kidney disease with stage 1 through stage 4 chronic kidney disease, or unspecified chronic kidney disease: Secondary | ICD-10-CM | POA: Diagnosis not present

## 2021-03-07 DIAGNOSIS — N2581 Secondary hyperparathyroidism of renal origin: Secondary | ICD-10-CM | POA: Diagnosis not present

## 2021-03-07 DIAGNOSIS — D509 Iron deficiency anemia, unspecified: Secondary | ICD-10-CM | POA: Diagnosis not present

## 2021-03-07 DIAGNOSIS — E1122 Type 2 diabetes mellitus with diabetic chronic kidney disease: Secondary | ICD-10-CM | POA: Diagnosis not present

## 2021-03-07 DIAGNOSIS — C649 Malignant neoplasm of unspecified kidney, except renal pelvis: Secondary | ICD-10-CM | POA: Diagnosis not present

## 2021-03-07 DIAGNOSIS — E785 Hyperlipidemia, unspecified: Secondary | ICD-10-CM | POA: Diagnosis not present

## 2021-03-07 DIAGNOSIS — N184 Chronic kidney disease, stage 4 (severe): Secondary | ICD-10-CM | POA: Diagnosis not present

## 2021-03-22 ENCOUNTER — Other Ambulatory Visit: Payer: Self-pay

## 2021-03-22 ENCOUNTER — Inpatient Hospital Stay: Payer: Medicare Other

## 2021-03-22 ENCOUNTER — Inpatient Hospital Stay: Payer: Medicare Other | Admitting: Oncology

## 2021-03-22 ENCOUNTER — Inpatient Hospital Stay: Payer: Medicare Other | Attending: Oncology

## 2021-03-22 VITALS — BP 159/88 | HR 69 | Temp 98.2°F | Resp 17 | Wt 205.7 lb

## 2021-03-22 DIAGNOSIS — Z5112 Encounter for antineoplastic immunotherapy: Secondary | ICD-10-CM | POA: Insufficient documentation

## 2021-03-22 DIAGNOSIS — Z79899 Other long term (current) drug therapy: Secondary | ICD-10-CM | POA: Insufficient documentation

## 2021-03-22 DIAGNOSIS — C641 Malignant neoplasm of right kidney, except renal pelvis: Secondary | ICD-10-CM

## 2021-03-22 DIAGNOSIS — C787 Secondary malignant neoplasm of liver and intrahepatic bile duct: Secondary | ICD-10-CM | POA: Diagnosis not present

## 2021-03-22 LAB — TSH: TSH: 0.884 u[IU]/mL (ref 0.320–4.118)

## 2021-03-22 LAB — CBC WITH DIFFERENTIAL (CANCER CENTER ONLY)
Abs Immature Granulocytes: 0.01 10*3/uL (ref 0.00–0.07)
Basophils Absolute: 0 10*3/uL (ref 0.0–0.1)
Basophils Relative: 1 %
Eosinophils Absolute: 0.1 10*3/uL (ref 0.0–0.5)
Eosinophils Relative: 4 %
HCT: 24.1 % — ABNORMAL LOW (ref 39.0–52.0)
Hemoglobin: 8 g/dL — ABNORMAL LOW (ref 13.0–17.0)
Immature Granulocytes: 0 %
Lymphocytes Relative: 15 %
Lymphs Abs: 0.5 10*3/uL — ABNORMAL LOW (ref 0.7–4.0)
MCH: 30.4 pg (ref 26.0–34.0)
MCHC: 33.2 g/dL (ref 30.0–36.0)
MCV: 91.6 fL (ref 80.0–100.0)
Monocytes Absolute: 0.4 10*3/uL (ref 0.1–1.0)
Monocytes Relative: 13 %
Neutro Abs: 2.1 10*3/uL (ref 1.7–7.7)
Neutrophils Relative %: 67 %
Platelet Count: 183 10*3/uL (ref 150–400)
RBC: 2.63 MIL/uL — ABNORMAL LOW (ref 4.22–5.81)
RDW: 13.5 % (ref 11.5–15.5)
WBC Count: 3 10*3/uL — ABNORMAL LOW (ref 4.0–10.5)
nRBC: 0 % (ref 0.0–0.2)

## 2021-03-22 LAB — CMP (CANCER CENTER ONLY)
ALT: 12 U/L (ref 0–44)
AST: 15 U/L (ref 15–41)
Albumin: 3.4 g/dL — ABNORMAL LOW (ref 3.5–5.0)
Alkaline Phosphatase: 122 U/L (ref 38–126)
Anion gap: 10 (ref 5–15)
BUN: 53 mg/dL — ABNORMAL HIGH (ref 8–23)
CO2: 16 mmol/L — ABNORMAL LOW (ref 22–32)
Calcium: 9.5 mg/dL (ref 8.9–10.3)
Chloride: 110 mmol/L (ref 98–111)
Creatinine: 5.09 mg/dL (ref 0.61–1.24)
GFR, Estimated: 11 mL/min — ABNORMAL LOW (ref >60)
Glucose, Bld: 126 mg/dL — ABNORMAL HIGH (ref 70–99)
Potassium: 5.8 mmol/L — ABNORMAL HIGH (ref 3.5–5.1)
Sodium: 136 mmol/L (ref 135–145)
Total Bilirubin: 0.2 mg/dL — ABNORMAL LOW (ref 0.3–1.2)
Total Protein: 7.2 g/dL (ref 6.5–8.1)

## 2021-03-22 MED ORDER — SODIUM CHLORIDE 0.9 % IV SOLN: Freq: Once | INTRAVENOUS | Status: AC

## 2021-03-22 MED ORDER — SODIUM CHLORIDE 0.9 % IV SOLN
480.0000 mg | Freq: Once | INTRAVENOUS | Status: AC
  Administered 2021-03-22: 480 mg via INTRAVENOUS
  Filled 2021-03-22: qty 48

## 2021-03-22 NOTE — Progress Notes (Signed)
CRITICAL VALUE STICKER  CRITICAL VALUE: Creatnine 5.09  RECEIVER (on-site recipient of call): Velna Ochs Rn  DATE & TIME NOTIFIED: 03/22/21 @ 1202  MESSENGER (representative from lab): Rosann Auerbach  MD NOTIFIED: Dr. Alen Blew  TIME OF NOTIFICATION: 1203  RESPONSE: MD aware

## 2021-03-22 NOTE — Progress Notes (Signed)
Ok to treat with elevated creat per Dr Alen Blew

## 2021-03-22 NOTE — Progress Notes (Signed)
Hematology and Oncology Follow Up Visit  Antonio Adams [id number] 01/22/1946 75 y.o. 03/22/2021 12:29 PM Antonio Adams, MDJones, [name], MD   Principle Diagnosis: 75 year old with advanced papillary renal cell carcinoma with hepatic involvement diagnosed in 2022.  He was initially diagnosed in 2017 with localized disease.  Prior Therapy:  He underwent right partial nephrectomy and found to have stage T1b 6 cm papillary tumor at that time.    He completed right adrenalectomy and metastatic resection of a recurrent kidney tumor right retroperitoneum completed in October 2021 and care of Dr. Louis Meckel.  The final pathology at that time confirmed the presence of papillary renal cell carcinoma.   He developed recurrent disease in March 2022 after recurrence including the adrenal metastasis as well as hepatic involvement.  He underwent percutaneous microwave ablation of the adrenal gland as well as hepatic metastasis completed in March 2022 under the care of Dr. Laurence Ferrari.   Current therapy: Nivolumab 480 mg every 4 weeks started on February 22, 2021.  He is here for cycle 2 of therapy.  Interim History: [name] presents today for return evaluation.  Since the last visit, he received nivolumab without any major complaints.  He denies any nausea, vomiting or fatigue.  He denies any skin rashes or lesions.  He denies any hand change in his bowel habits or excessive fatigue.     Medications: Updated on review. Current Outpatient Medications  Medication Sig Dispense Refill   acetaminophen (TYLENOL) 500 MG tablet Take 2 tablets (1,000 mg total) by mouth every 6 (six) hours. 60 tablet 0   amLODipine (NORVASC) 5 MG tablet Take 1 tablet (5 mg total) by mouth daily. 90 tablet 1   aspirin EC 81 MG tablet Take 81 mg by mouth daily. Swallow whole.     atorvastatin (LIPITOR) 40 MG tablet Take 1 tablet (40 mg total) by mouth daily. 90 tablet 1   cholecalciferol (VITAMIN D) 1000 units tablet  Take 1,000 Units by mouth daily.     Continuous Blood Gluc Receiver (FREESTYLE LIBRE 14 DAY READER) DEVI 1 Act by Does not apply route daily. 2 each 5   Continuous Blood Gluc Sensor (FREESTYLE LIBRE 2 SENSOR) MISC 1 Act by Does not apply route daily. 2 each 5   dimenhyDRINATE (DRAMAMINE) 50 MG tablet Take 50 mg by mouth every 8 (eight) hours as needed for dizziness (vertigo).     docusate sodium (COLACE) 100 MG capsule Take 1 capsule (100 mg total) by mouth 2 (two) times daily. 60 capsule 0   ferrous sulfate 325 (65 FE) MG tablet Take 1 tablet (325 mg total) by mouth 2 (two) times daily. Patient needs office visit for further refills 60 tablet 1   gabapentin (NEURONTIN) 100 MG capsule Take 100 mg by mouth at bedtime.     gemfibrozil (LOPID) 600 MG tablet Take 600 mg by mouth 2 (two) times daily before a meal.     indapamide (LOZOL) 1.25 MG tablet TAKE 1 TABLET BY MOUTH EVERY DAY 30 tablet 0   Insulin Pen Needle 32G X 6 MM MISC 1 Act by Does not apply route daily. 100 each 1   Omega-3 Fatty Acids (FISH OIL PO) Take 1 tablet by mouth daily.     ONE TOUCH ULTRA TEST test strip USE 2 (TWO) TIMES DAILY. AND LANCETS 2/DAY 250.03 100 each 11   OVER THE COUNTER MEDICATION Take 1 tablet by mouth 2 (two) times daily. Focus factor     polyvinyl alcohol (  LIQUIFILM TEARS) 1.4 % ophthalmic solution Place 1 drop into both eyes as needed for dry eyes.     prochlorperazine (COMPAZINE) 10 MG tablet Take 1 tablet (10 mg total) by mouth every 6 (six) hours as needed for nausea or vomiting. 30 tablet 0   solifenacin (VESICARE) 5 MG tablet Take 1 tablet (5 mg total) by mouth daily. 90 tablet 1   tamsulosin (FLOMAX) 0.4 MG CAPS capsule TAKE 1 CAPSULE (0.4 MG TOTAL) BY MOUTH DAILY. (Patient taking differently: Take 0.4 mg by mouth daily.) 30 capsule 1   tamsulosin (FLOMAX) 0.4 MG CAPS capsule Take by mouth.     traMADol (ULTRAM) 50 MG tablet Take 50-100 mg by mouth every 6 (six) hours as needed for pain.     No current  facility-administered medications for this visit.   Facility-Administered Medications Ordered in Other Visits  Medication Dose Route Frequency Provider Last Rate Last Admin   nivolumab (OPDIVO) 480 mg in sodium chloride 0.9 % 100 mL chemo infusion  480 mg Intravenous Once Wyatt Portela, MD         Allergies:  Allergies  Allergen Reactions   Ace Inhibitors Swelling and Other (See Comments)   Lisinopril Swelling     Physical Exam: Blood pressure (!) 159/88, pulse 69, temperature 98.2 F (36.8 C), temperature source Temporal, resp. rate 17, weight 205 lb 11.2 oz (93.3 kg), SpO2 100 %.  ECOG:      General appearance: Alert, awake without any distress. Head: Atraumatic without abnormalities Oropharynx: Without any thrush or ulcers. Eyes: No scleral icterus. Lymph nodes: No lymphadenopathy noted in the cervical, supraclavicular, or axillary nodes Heart:regular rate and rhythm, without any murmurs or gallops.   Lung: Clear to auscultation without any rhonchi, wheezes or dullness to percussion. Abdomin: Soft, nontender without any shifting dullness or ascites. Musculoskeletal: No clubbing or cyanosis. Neurological: No motor or sensory deficits. Skin: No rashes or lesions. Psychiatric: Mood and affect appeared normal.     Lab Results: Lab Results  Component Value Date   WBC 3.0 (L) 03/22/2021   HGB 8.0 (L) 03/22/2021   HCT 24.1 (L) 03/22/2021   MCV 91.6 03/22/2021   PLT 183 03/22/2021     Chemistry      Component Value Date/Time   NA 136 03/22/2021 1118   NA 138 12/02/2020 0000   K 5.8 (H) 03/22/2021 1118   CL 110 03/22/2021 1118   CO2 16 (L) 03/22/2021 1118   BUN 53 (H) 03/22/2021 1118   BUN 37 (A) 12/02/2020 0000   CREATININE 5.09 (HH) 03/22/2021 1118   CREATININE 2.11 (H) 12/18/2019 1425   GLU 152 12/02/2020 0000      Component Value Date/Time   CALCIUM 9.5 03/22/2021 1118   ALKPHOS 122 03/22/2021 1118   AST 15 03/22/2021 1118   ALT 12 03/22/2021 1118    BILITOT 0.2 (L) 03/22/2021 1118         Impression and Plan:   71 year old with:  1.  Kidney cancer diagnosed in 2017.  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  He is currently on nivolumab which she has tolerated very well.  Risks and benefits of continuing this treatment were discussed.  Potential complications including autoimmune complications as well as GI toxicity were reiterated.  He is agreeable to proceed at this time and will repeat imaging studies after cycle 4.   2.  IV access: Peripheral veins are currently in use without any need for Port-A-Cath  insertion.   3.  Antiemetics: No nausea or vomiting reported at this time.  Compazine is available to him.   4.  Autoimmune complications: We will continue to educate him about potential issues including pneumonitis, colitis and thyroid disease.  5.  Anemia: Related to chronic renal failure with hemoglobin is stable.   6.  Follow-up: He will return in 4 weeks for repeat follow-up.   30  minutes were spent on this visit.  Time was dedicated to reviewing laboratory data, disease status update and outlining future plan of care.     Zola Button, MD 11/8/202212:29 PM

## 2021-03-22 NOTE — Patient Instructions (Signed)
[facility]  Discharge Instructions: ?Thank you for choosing [facility] to provide your oncology and hematology care.  ? ?If you have a lab appointment with the Cancer Center, please go directly to the Cancer Center and check in at the registration area. ?  ?Wear comfortable clothing and clothing appropriate for easy access to any Portacath or PICC line.  ? ?We strive to give you quality time with your provider. You may need to reschedule your appointment if you arrive late (15 or more minutes).  Arriving late affects you and other patients whose appointments are after yours.  Also, if you miss three or more appointments without notifying the office, you may be dismissed from the clinic at the provider?s discretion.    ?  ?For prescription refill requests, have your pharmacy contact our office and allow 72 hours for refills to be completed.   ? ?Today you received the following chemotherapy and/or immunotherapy agents Opdivo    ?  ?To help prevent nausea and vomiting after your treatment, we encourage you to take your nausea medication as directed. ? ?BELOW ARE SYMPTOMS THAT SHOULD BE REPORTED IMMEDIATELY: ?*FEVER GREATER THAN 100.4 F (38 ?C) OR HIGHER ?*CHILLS OR SWEATING ?*NAUSEA AND VOMITING THAT IS NOT CONTROLLED WITH YOUR NAUSEA MEDICATION ?*UNUSUAL SHORTNESS OF BREATH ?*UNUSUAL BRUISING OR BLEEDING ?*URINARY PROBLEMS (pain or burning when urinating, or frequent urination) ?*BOWEL PROBLEMS (unusual diarrhea, constipation, pain near the anus) ?TENDERNESS IN MOUTH AND THROAT WITH OR WITHOUT PRESENCE OF ULCERS (sore throat, sores in mouth, or a toothache) ?UNUSUAL RASH, SWELLING OR PAIN  ?UNUSUAL VAGINAL DISCHARGE OR ITCHING  ? ?Items with * indicate a potential emergency and should be followed up as soon as possible or go to the Emergency Department if any problems should occur. ? ?Please show the CHEMOTHERAPY ALERT CARD or IMMUNOTHERAPY ALERT CARD at check-in to the  Emergency Department and triage nurse. ? ?Should you have questions after your visit or need to cancel or reschedule your appointment, please contact [facility]  Dept: [phone]  and follow the prompts.  Office hours are 8:00 a.m. to 4:30 p.m. Monday - Friday. Please note that voicemails left after 4:00 p.m. may not be returned until the following business day.  We are closed weekends and major holidays. You have access to a nurse at all times for urgent questions. Please call the main number to the clinic Dept: [phone] and follow the prompts. ? ? ?For any non-urgent questions, you may also contact your provider using MyChart. We now offer e-Visits for anyone 18 and older to request care online for non-urgent symptoms. For details visit [email or web address]. ?  ?Also download the MyChart app! Go to the app store, search "MyChart", open the app, select [facility], and log in with your MyChart username and password. ? ?Due to Covid, a mask is required upon entering the hospital/clinic. If you do not have a mask, one will be given to you upon arrival. For doctor visits, patients may have 1 support person aged 18 or older with them. For treatment visits, patients cannot have anyone with them due to current Covid guidelines and our immunocompromised population.  ? ?

## 2021-04-04 ENCOUNTER — Telehealth: Payer: Self-pay | Admitting: *Deleted

## 2021-04-04 NOTE — Telephone Encounter (Signed)
Communicated doctors response to the patient.

## 2021-04-04 NOTE — Telephone Encounter (Signed)
Patient called concerned for side effects that have developed over the past two weeks.  Reports eye discharge (worse in the morning), scalp peelings, itching of arms/buttocks/thighs. Also reports rectal bleeding volume of blood present ranges from teaspoon to tablespoon in size.  Patient confirms also he does have hemorrhoids.  Has received 2 doses of Opdivo.  Patient states he feels for certain that these are side effects of the medication.  Wants to know how to proceed.  Routing to Dr Alen Blew to advise.

## 2021-04-08 ENCOUNTER — Other Ambulatory Visit: Payer: Self-pay | Admitting: Internal Medicine

## 2021-04-08 ENCOUNTER — Encounter: Payer: Self-pay | Admitting: Internal Medicine

## 2021-04-18 ENCOUNTER — Encounter: Payer: Self-pay | Admitting: Internal Medicine

## 2021-04-18 ENCOUNTER — Telehealth: Payer: Self-pay | Admitting: *Deleted

## 2021-04-18 ENCOUNTER — Other Ambulatory Visit: Payer: Self-pay

## 2021-04-18 ENCOUNTER — Ambulatory Visit (INDEPENDENT_AMBULATORY_CARE_PROVIDER_SITE_OTHER): Payer: Medicare Other | Admitting: Internal Medicine

## 2021-04-18 VITALS — BP 132/82 | HR 69 | Temp 98.3°F | Ht 71.0 in | Wt 201.0 lb

## 2021-04-18 DIAGNOSIS — L2084 Intrinsic (allergic) eczema: Secondary | ICD-10-CM

## 2021-04-18 DIAGNOSIS — T7840XA Allergy, unspecified, initial encounter: Secondary | ICD-10-CM | POA: Insufficient documentation

## 2021-04-18 MED ORDER — METHYLPREDNISOLONE 4 MG PO TBPK: ORAL_TABLET | ORAL | 0 refills | Status: AC

## 2021-04-18 MED ORDER — LEVOCETIRIZINE DIHYDROCHLORIDE 5 MG PO TABS: 5.0000 mg | ORAL_TABLET | Freq: Every evening | ORAL | 0 refills | Status: DC

## 2021-04-18 MED ORDER — FLUOCINONIDE EMULSIFIED BASE 0.05 % EX CREA: 1.0000 "application " | TOPICAL_CREAM | Freq: Two times a day (BID) | CUTANEOUS | 1 refills | Status: DC

## 2021-04-18 NOTE — Telephone Encounter (Signed)
Antonio Adams states he wants to cancel his infusion appts due to rash from head to legs. Encouraged him to keep appt on Wednesday for lab and see Dr Alen Blew. RN did not cancel infusion appt. Pt states he called a few weeks ago, did not call back to say rash had gotten worse. Went to PCP this morning.

## 2021-04-18 NOTE — Patient Instructions (Signed)
Drug Rash A drug rash occurs when a medicine causes a change in the color or texture of the skin. It can develop minutes, hours, or days after you take the medicine. The rash may appear on a small area of skin or all over your body. What are the causes? This condition may be caused by one of these three conditions: An allergic reaction to the medicine. An unwanted side effect of a certain medicine. Extreme sensitivity to sunlight caused by the medicine. What increases the risk? If you take any of these medicines that make your skin sensitive to light and are exposed to sunlight, it can make you more likely to develop this condition: Antibiotics, including tetracyclines and sulfa medicines. Antifungals. Antihistamines. Diuretics. Retinoids, such as isotretinoin. Statins. NSAIDs. What are the signs or symptoms? Symptoms of this condition include: Redness. Tiny bumps. Peeling. Itching. Itchy welts (hives). Swelling. How is this diagnosed? This condition may be diagnosed based on: A physical exam. Tests to find out which medicine caused the rash. These tests may include: Skin tests. Blood tests. How is this treated? This condition is treated with medicines, including: Antihistamine. This may be given to relieve itching. NSAIDs. These may be given to reduce swelling and to treat pain. A steroid medicine. This may be given to reduce swelling. The rash usually goes away when you stop taking the medicine that caused it. Follow these instructions at home: Take over-the-counter and prescription medicines only as told by your health care provider. Tell all your health care providers about any medicine reactions that you have had in the past. If your rash was caused by sensitivity to sunlight, and while your rash is healing: Avoid being in the sun if possible, especially when it is strongest, usually between 10 a.m. and 4 p.m. Cover your skin with pants, long sleeves, and a hat when you  are exposed to sunlight. If you have hives: Take a cool shower or use a cool compress to relieve itchiness. Take over-the-counter antihistamines, as recommended by your health care provider, until the hives are gone. Hives are not contagious. Keep all follow-up visits. This is important. Contact a health care provider if: You have fever. Your rash is not going away. Your rash gets worse. Your rash comes back. You have high-pitched whistling sounds when you breathe, most often when you breathe out (wheezing) or coughing. Get help right away if: You start to have breathing problems. You start to have shortness of breath. Your face or throat starts to swell. You have severe weakness with dizziness or fainting. You have chest pain. Your skin starts to blister and peel. These symptoms may represent a serious problem that is an emergency. Do not wait to see if the symptoms will go away. Get medical help right away. Call your local emergency services (911 in the [place] Do not drive yourself to the hospital. Summary A drug rash occurs when a medicine causes a change in the color or texture of the skin. The rash may appear on a small area of skin or all over your body. It can develop minutes, hours, or days after you take the medicine. Your health care provider will do various tests to determine what medicine caused your rash. The rash may be treated with medicine to relieve itching, swelling, and pain. This information is not intended to replace advice given to you by your health care provider. Make sure you discuss any questions you have with your health care provider. Document Revised: 10/11/2020 Document   Reviewed: 10/11/2020 Elsevier Patient Education  2022 [employer] Inc.  

## 2021-04-18 NOTE — Progress Notes (Addendum)
Subjective:  Patient ID: Antonio Adams, male    DOB: [birth year]  Age: 75 y.o. MRN: [id number]  CC: Rash  This visit occurred during the SARS-CoV-2 public health emergency.  Safety protocols were in place, including screening questions prior to the visit, additional usage of staff PPE, and extensive cleaning of exam room while observing appropriate contact time as indicated for disinfecting solutions.    HPI Antonio Adams presents for f/up -  He complains of a 2 to 3-week history of itchy rash on his extremities and torso.  He says the rash started after he got his first dose of Opdivo.  He has not gotten much symptom relief with Benadryl.  He denies swelling around his face, lips, or tongue.  He denies cough, shortness of breath, or wheezing.  Outpatient Medications Prior to Visit  Medication Sig Dispense Refill   acetaminophen (TYLENOL) 500 MG tablet Take 2 tablets (1,000 mg total) by mouth every 6 (six) hours. 60 tablet 0   amLODipine (NORVASC) 5 MG tablet Take 1 tablet (5 mg total) by mouth daily. 90 tablet 1   aspirin EC 81 MG tablet Take 81 mg by mouth daily. Swallow whole.     atorvastatin (LIPITOR) 40 MG tablet Take 1 tablet (40 mg total) by mouth daily. 90 tablet 1   cholecalciferol (VITAMIN D) 1000 units tablet Take 1,000 Units by mouth daily.     Continuous Blood Gluc Receiver (FREESTYLE LIBRE 14 DAY READER) DEVI 1 Act by Does not apply route daily. 2 each 5   Continuous Blood Gluc Sensor (FREESTYLE LIBRE 2 SENSOR) MISC 1 Act by Does not apply route daily. 2 each 5   dimenhyDRINATE (DRAMAMINE) 50 MG tablet Take 50 mg by mouth every 8 (eight) hours as needed for dizziness (vertigo).     docusate sodium (COLACE) 100 MG capsule Take 1 capsule (100 mg total) by mouth 2 (two) times daily. 60 capsule 0   ferrous sulfate 325 (65 FE) MG tablet Take 1 tablet (325 mg total) by mouth 2 (two) times daily. Patient needs office visit for further refills 60 tablet 1   gabapentin  (NEURONTIN) 100 MG capsule Take 100 mg by mouth at bedtime.     gemfibrozil (LOPID) 600 MG tablet Take 600 mg by mouth 2 (two) times daily before a meal.     indapamide (LOZOL) 1.25 MG tablet TAKE 1 TABLET BY MOUTH EVERY DAY 30 tablet 0   Insulin Pen Needle 32G X 6 MM MISC 1 Act by Does not apply route daily. 100 each 1   Omega-3 Fatty Acids (FISH OIL PO) Take 1 tablet by mouth daily.     ONE TOUCH ULTRA TEST test strip USE 2 (TWO) TIMES DAILY. AND LANCETS 2/DAY 250.03 100 each 11   OVER THE COUNTER MEDICATION Take 1 tablet by mouth 2 (two) times daily. Focus factor     polyvinyl alcohol (LIQUIFILM TEARS) 1.4 % ophthalmic solution Place 1 drop into both eyes as needed for dry eyes.     prochlorperazine (COMPAZINE) 10 MG tablet Take 1 tablet (10 mg total) by mouth every 6 (six) hours as needed for nausea or vomiting. 30 tablet 0   solifenacin (VESICARE) 5 MG tablet Take 1 tablet (5 mg total) by mouth daily. 90 tablet 1   tamsulosin (FLOMAX) 0.4 MG CAPS capsule TAKE 1 CAPSULE (0.4 MG TOTAL) BY MOUTH DAILY. (Patient taking differently: Take 0.4 mg by mouth daily.) 30 capsule 1   tamsulosin (FLOMAX) 0.4 MG  CAPS capsule Take by mouth.     traMADol (ULTRAM) 50 MG tablet Take 50-100 mg by mouth every 6 (six) hours as needed for pain.     No facility-administered medications prior to visit.    ROS Review of Systems  Constitutional:  Negative for chills, diaphoresis, fatigue and fever.  HENT: Negative.    Eyes: Negative.   Respiratory:  Negative for chest tightness, shortness of breath and wheezing.   Cardiovascular:  Negative for chest pain, palpitations and leg swelling.  Gastrointestinal:  Negative for abdominal pain, constipation, diarrhea, nausea and vomiting.  Endocrine: Negative.   Genitourinary: Negative.  Negative for difficulty urinating.  Musculoskeletal:  Negative for arthralgias and myalgias.  Skin:  Positive for rash. Negative for color change.  Neurological: Negative.  Negative for  dizziness, weakness, light-headedness and headaches.  Hematological:  Negative for adenopathy. Does not bruise/bleed easily.  Psychiatric/Behavioral: Negative.     Objective:  BP 132/82 (BP Location: Right Arm, Patient Position: Sitting, Cuff Size: Large)   Pulse 69   Temp 98.3 F (36.8 C) (Oral)   Ht 5\' 11"  (1.803 m)   Wt 201 lb (91.2 kg)   SpO2 98%   BMI 28.03 kg/m   BP Readings from Last 3 Encounters:  04/18/21 132/82  03/22/21 (!) 159/88  02/22/21 (!) 150/84    Wt Readings from Last 3 Encounters:  04/18/21 201 lb (91.2 kg)  03/22/21 205 lb 11.2 oz (93.3 kg)  02/22/21 206 lb (93.4 kg)    Physical Exam Vitals reviewed.  HENT:     Nose: Nose normal.     Mouth/Throat:     Mouth: Mucous membranes are moist.  Eyes:     Conjunctiva/sclera: Conjunctivae normal.  Cardiovascular:     Rate and Rhythm: Normal rate.     Heart sounds: Murmur heard.  Systolic murmur is present with a grade of 2/6.  No diastolic murmur is present.    No gallop.  Pulmonary:     Effort: Pulmonary effort is normal.     Breath sounds: No stridor. No wheezing, rhonchi or rales.  Abdominal:     General: Abdomen is flat.     Palpations: There is no mass.     Tenderness: There is no abdominal tenderness. There is no guarding.     Hernia: No hernia is present.  Musculoskeletal:        General: Normal range of motion.     Cervical back: Neck supple.     Right lower leg: No edema.     Left lower leg: No edema.  Lymphadenopathy:     Cervical: No cervical adenopathy.  Skin:    General: Skin is warm.     Coloration: Skin is not pale.     Findings: Rash present. No abrasion, abscess or erythema. Rash is macular and papular. Rash is not crusting, pustular, urticarial or vesicular.     Comments: There are groups of pale, scaly macules and papulosquamous lesions on the dorsal surfaces of both forearms and scattered around the torso.  They are too numerous to count.  Neurological:     General: No focal  deficit present.     Mental Status: He is alert.  Psychiatric:        Mood and Affect: Mood normal.        Behavior: Behavior normal.    Lab Results  Component Value Date   WBC 3.0 (L) 03/22/2021   HGB 8.0 (L) 03/22/2021   HCT 24.1 (L) 03/22/2021  PLT 183 03/22/2021   GLUCOSE 126 (H) 03/22/2021   CHOL 176 12/02/2020   TRIG 86 12/02/2020   HDL 51 12/02/2020   LDLCALC 108 12/02/2020   ALT 12 03/22/2021   AST 15 03/22/2021   NA 136 03/22/2021   K 5.8 (H) 03/22/2021   CL 110 03/22/2021   CREATININE 5.09 (HH) 03/22/2021   BUN 53 (H) 03/22/2021   CO2 16 (L) 03/22/2021   TSH 0.884 03/22/2021   PSA 0.01 (L) 05/20/2020   INR 1.0 07/21/2020   HGBA1C 6.3 12/02/2020   MICROALBUR 97.0 12/18/2019    No results found.  Assessment & Plan:   Hassaan was seen today for rash.  Diagnoses and all orders for this visit:  Intrinsic eczema- See below. -     methylPREDNISolone (MEDROL DOSEPAK) 4 MG TBPK tablet; TAKE AS DIRECTED -     fluocinonide-emollient (LIDEX-E) 0.05 % cream; Apply 1 application topically 2 (two) times daily. -     levocetirizine (XYZAL) 5 MG tablet; Take 1 tablet (5 mg total) by mouth every evening.  Allergic reaction to drug, initial encounter- He says he is no longer willing to take Opdivo.  Will treat with topical and systemic steroids as well as a systemic antihistamine. -     methylPREDNISolone (MEDROL DOSEPAK) 4 MG TBPK tablet; TAKE AS DIRECTED -     fluocinonide-emollient (LIDEX-E) 0.05 % cream; Apply 1 application topically 2 (two) times daily. -     levocetirizine (XYZAL) 5 MG tablet; Take 1 tablet (5 mg total) by mouth every evening.  I am having Otila Back. Johnnye Sima start on methylPREDNISolone, fluocinonide-emollient, and levocetirizine. I am also having him maintain his tamsulosin, cholecalciferol, ONE TOUCH ULTRA TEST, ferrous sulfate, atorvastatin, gabapentin, gemfibrozil, aspirin EC, docusate sodium, acetaminophen, FreeStyle Libre 14 Day Reader,  FreeStyle Libre 2 Sensor, amLODipine, Insulin Pen Needle, traMADol, polyvinyl alcohol, Omega-3 Fatty Acids (FISH OIL PO), OVER THE COUNTER MEDICATION, dimenhyDRINATE, tamsulosin, indapamide, solifenacin, and prochlorperazine.  Meds ordered this encounter  Medications   methylPREDNISolone (MEDROL DOSEPAK) 4 MG TBPK tablet    Sig: TAKE AS DIRECTED    Dispense:  21 tablet    Refill:  0   fluocinonide-emollient (LIDEX-E) 0.05 % cream    Sig: Apply 1 application topically 2 (two) times daily.    Dispense:  60 g    Refill:  1   levocetirizine (XYZAL) 5 MG tablet    Sig: Take 1 tablet (5 mg total) by mouth every evening.    Dispense:  90 tablet    Refill:  0      Follow-up: Return if symptoms worsen or fail to improve.  Scarlette Calico, MD

## 2021-04-20 ENCOUNTER — Inpatient Hospital Stay: Payer: Medicare Other | Attending: Oncology

## 2021-04-20 ENCOUNTER — Other Ambulatory Visit: Payer: Self-pay

## 2021-04-20 ENCOUNTER — Inpatient Hospital Stay: Payer: Medicare Other

## 2021-04-20 ENCOUNTER — Inpatient Hospital Stay (HOSPITAL_BASED_OUTPATIENT_CLINIC_OR_DEPARTMENT_OTHER): Payer: Medicare Other | Admitting: Oncology

## 2021-04-20 VITALS — BP 152/71 | HR 84 | Temp 98.1°F | Resp 17 | Ht 71.0 in | Wt 204.2 lb

## 2021-04-20 DIAGNOSIS — C787 Secondary malignant neoplasm of liver and intrahepatic bile duct: Secondary | ICD-10-CM | POA: Insufficient documentation

## 2021-04-20 DIAGNOSIS — D649 Anemia, unspecified: Secondary | ICD-10-CM | POA: Insufficient documentation

## 2021-04-20 DIAGNOSIS — Z905 Acquired absence of kidney: Secondary | ICD-10-CM | POA: Insufficient documentation

## 2021-04-20 DIAGNOSIS — C641 Malignant neoplasm of right kidney, except renal pelvis: Secondary | ICD-10-CM | POA: Insufficient documentation

## 2021-04-20 DIAGNOSIS — Z79899 Other long term (current) drug therapy: Secondary | ICD-10-CM | POA: Insufficient documentation

## 2021-04-20 DIAGNOSIS — D630 Anemia in neoplastic disease: Secondary | ICD-10-CM | POA: Diagnosis not present

## 2021-04-20 DIAGNOSIS — L308 Other specified dermatitis: Secondary | ICD-10-CM | POA: Diagnosis not present

## 2021-04-20 DIAGNOSIS — N289 Disorder of kidney and ureter, unspecified: Secondary | ICD-10-CM | POA: Diagnosis not present

## 2021-04-20 LAB — CMP (CANCER CENTER ONLY)
ALT: 21 U/L (ref 0–44)
AST: 21 U/L (ref 15–41)
Albumin: 3.3 g/dL — ABNORMAL LOW (ref 3.5–5.0)
Alkaline Phosphatase: 150 U/L — ABNORMAL HIGH (ref 38–126)
Anion gap: 9 (ref 5–15)
BUN: 62 mg/dL — ABNORMAL HIGH (ref 8–23)
CO2: 12 mmol/L — ABNORMAL LOW (ref 22–32)
Calcium: 9.2 mg/dL (ref 8.9–10.3)
Chloride: 116 mmol/L — ABNORMAL HIGH (ref 98–111)
Creatinine: 4.97 mg/dL (ref 0.61–1.24)
GFR, Estimated: 11 mL/min — ABNORMAL LOW (ref >60)
Glucose, Bld: 164 mg/dL — ABNORMAL HIGH (ref 70–99)
Potassium: 6.1 mmol/L — ABNORMAL HIGH (ref 3.5–5.1)
Sodium: 137 mmol/L (ref 135–145)
Total Bilirubin: 0.2 mg/dL — ABNORMAL LOW (ref 0.3–1.2)
Total Protein: 7.2 g/dL (ref 6.5–8.1)

## 2021-04-20 LAB — CBC WITH DIFFERENTIAL (CANCER CENTER ONLY)
Abs Immature Granulocytes: 0.01 10*3/uL (ref 0.00–0.07)
Basophils Absolute: 0 10*3/uL (ref 0.0–0.1)
Basophils Relative: 0 %
Eosinophils Absolute: 0 10*3/uL (ref 0.0–0.5)
Eosinophils Relative: 1 %
HCT: 23.5 % — ABNORMAL LOW (ref 39.0–52.0)
Hemoglobin: 8.4 g/dL — ABNORMAL LOW (ref 13.0–17.0)
Immature Granulocytes: 0 %
Lymphocytes Relative: 12 %
Lymphs Abs: 0.4 10*3/uL — ABNORMAL LOW (ref 0.7–4.0)
MCH: 32.3 pg (ref 26.0–34.0)
MCHC: 35.7 g/dL (ref 30.0–36.0)
MCV: 90.4 fL (ref 80.0–100.0)
Monocytes Absolute: 0.4 10*3/uL (ref 0.1–1.0)
Monocytes Relative: 12 %
Neutro Abs: 2.8 10*3/uL (ref 1.7–7.7)
Neutrophils Relative %: 75 %
Platelet Count: 231 10*3/uL (ref 150–400)
RBC: 2.6 MIL/uL — ABNORMAL LOW (ref 4.22–5.81)
RDW: 14.1 % (ref 11.5–15.5)
WBC Count: 3.7 10*3/uL — ABNORMAL LOW (ref 4.0–10.5)
nRBC: 0 % (ref 0.0–0.2)

## 2021-04-20 LAB — TSH: TSH: 0.848 u[IU]/mL (ref 0.320–4.118)

## 2021-04-20 NOTE — Progress Notes (Signed)
CRITICAL VALUE STICKER  CRITICAL VALUE: creatnine 4.97  RECEIVER (on-site recipient of call): Velna Ochs RN  DATE & TIME NOTIFIED: 04/20/21 @ 0948  MESSENGER (representative from lab): Ulice Dash  MD NOTIFIED: Dr. Alen Blew  TIME OF NOTIFICATION: 0950  RESPONSE:  MD aware, also informed of K 6.1

## 2021-04-20 NOTE — Progress Notes (Signed)
Hematology and Oncology Follow Up Visit  Antonio Adams [id number] Dec 26, 1945 75 y.o. 04/20/2021 9:10 AM Antonio Adams, MDJones, Antonio Right, MD   Principle Diagnosis: 75 year old with kidney cancer diagnosed in 2017.  He developed stage IV papillary tumor with hepatic involvement diagnosed in 2022.  H  Prior Therapy:  He underwent Adams partial nephrectomy and found to have stage T1b 6 cm papillary tumor at that time.    He completed Adams adrenalectomy and metastatic resection of a recurrent kidney tumor Adams retroperitoneum completed in October 2021 and care of Dr. Louis Adams.  The final pathology at that time confirmed the presence of papillary renal cell carcinoma.   He developed recurrent disease in March 2022 after recurrence including the adrenal metastasis as well as hepatic involvement.  He underwent percutaneous microwave ablation of the adrenal gland as well as hepatic metastasis completed in March 2022 under the care of Dr. Laurence Adams.   Current therapy: Nivolumab 480 mg every 4 weeks started on February 22, 2021.  He completed 2 cycles of therapy.  Interim History: [name] returns today for a follow-up visit.  Since the last visit, he developed skin rash in the last 2 weeks.  He describes a is a raised lesions on his upper extremities, chest and back as well as scalp.  He has reported symptoms of pruritus and skin peeling.  He was evaluated by Dr. Ronnald Adams and was prescribed topical steroids as well as Medrol Dosepak which she just started.  He did notice some slight improvement in the last few days.     Medications: Reviewed without changes. Current Outpatient Medications  Medication Sig Dispense Refill   acetaminophen (TYLENOL) 500 MG tablet Take 2 tablets (1,000 mg total) by mouth every 6 (six) hours. 60 tablet 0   amLODipine (NORVASC) 5 MG tablet Take 1 tablet (5 mg total) by mouth daily. 90 tablet 1   aspirin EC 81 MG tablet Take 81 mg by mouth daily. Swallow whole.      atorvastatin (LIPITOR) 40 MG tablet Take 1 tablet (40 mg total) by mouth daily. 90 tablet 1   cholecalciferol (VITAMIN D) 1000 units tablet Take 1,000 Units by mouth daily.     Continuous Blood Gluc Receiver (FREESTYLE LIBRE 14 DAY READER) DEVI 1 Act by Does not apply route daily. 2 each 5   Continuous Blood Gluc Sensor (FREESTYLE LIBRE 2 SENSOR) MISC 1 Act by Does not apply route daily. 2 each 5   dimenhyDRINATE (DRAMAMINE) 50 MG tablet Take 50 mg by mouth every 8 (eight) hours as needed for dizziness (vertigo).     docusate sodium (COLACE) 100 MG capsule Take 1 capsule (100 mg total) by mouth 2 (two) times daily. 60 capsule 0   ferrous sulfate 325 (65 FE) MG tablet Take 1 tablet (325 mg total) by mouth 2 (two) times daily. Patient needs office visit for further refills 60 tablet 1   fluocinonide-emollient (LIDEX-E) 0.05 % cream Apply 1 application topically 2 (two) times daily. 60 g 1   gabapentin (NEURONTIN) 100 MG capsule Take 100 mg by mouth at bedtime.     gemfibrozil (LOPID) 600 MG tablet Take 600 mg by mouth 2 (two) times daily before a meal.     indapamide (LOZOL) 1.25 MG tablet TAKE 1 TABLET BY MOUTH EVERY DAY 30 tablet 0   Insulin Pen Needle 32G X 6 MM MISC 1 Act by Does not apply route daily. 100 each 1   levocetirizine (XYZAL) 5 MG tablet Take  1 tablet (5 mg total) by mouth every evening. 90 tablet 0   methylPREDNISolone (MEDROL DOSEPAK) 4 MG TBPK tablet TAKE AS DIRECTED 21 tablet 0   Omega-3 Fatty Acids (FISH OIL PO) Take 1 tablet by mouth daily.     ONE TOUCH ULTRA TEST test strip USE 2 (TWO) TIMES DAILY. AND LANCETS 2/DAY 250.03 100 each 11   OVER THE COUNTER MEDICATION Take 1 tablet by mouth 2 (two) times daily. Focus factor     polyvinyl alcohol (LIQUIFILM TEARS) 1.4 % ophthalmic solution Place 1 drop into both eyes as needed for dry eyes.     prochlorperazine (COMPAZINE) 10 MG tablet Take 1 tablet (10 mg total) by mouth every 6 (six) hours as needed for nausea or vomiting.  30 tablet 0   solifenacin (VESICARE) 5 MG tablet Take 1 tablet (5 mg total) by mouth daily. 90 tablet 1   tamsulosin (FLOMAX) 0.4 MG CAPS capsule TAKE 1 CAPSULE (0.4 MG TOTAL) BY MOUTH DAILY. (Patient taking differently: Take 0.4 mg by mouth daily.) 30 capsule 1   tamsulosin (FLOMAX) 0.4 MG CAPS capsule Take by mouth.     traMADol (ULTRAM) 50 MG tablet Take 50-100 mg by mouth every 6 (six) hours as needed for pain.     No current facility-administered medications for this visit.     Allergies:  Allergies  Allergen Reactions   Ace Inhibitors Swelling and Other (See Comments)   Lisinopril Swelling   Opdivo [Nivolumab] Rash     Physical Exam: Blood pressure (!) 152/71, pulse 84, temperature 98.1 F (36.7 C), temperature source Temporal, resp. rate 17, height 5\' 11"  (1.803 m), weight 204 lb 3.2 oz (92.6 kg), SpO2 100 %.   ECOG:     General appearance: Comfortable appearing without any discomfort Head: Normocephalic without any trauma Oropharynx: Mucous membranes are moist and pink without any thrush or ulcers. Eyes: Pupils are equal and round reactive to light. Lymph nodes: No cervical, supraclavicular, inguinal or axillary lymphadenopathy.   Heart:regular rate and rhythm.  S1 and S2 without leg edema. Lung: Clear without any rhonchi or wheezes.  No dullness to percussion. Abdomin: Soft, nontender, nondistended with good bowel sounds.  No hepatosplenomegaly. Musculoskeletal: No joint deformity or effusion.  Full range of motion noted. Neurological: No deficits noted on motor, sensory and deep tendon reflex exam. Skin: Raised erythematous lesions noted on his upper extremities.  None on the scalp.    Lab Results: Lab Results  Component Value Date   WBC 3.0 (L) 03/22/2021   HGB 8.0 (L) 03/22/2021   HCT 24.1 (L) 03/22/2021   MCV 91.6 03/22/2021   PLT 183 03/22/2021     Chemistry      Component Value Date/Time   NA 136 03/22/2021 1118   NA 138 12/02/2020 0000   K 5.8  (H) 03/22/2021 1118   CL 110 03/22/2021 1118   CO2 16 (L) 03/22/2021 1118   BUN 53 (H) 03/22/2021 1118   BUN 37 (A) 12/02/2020 0000   CREATININE 5.09 (HH) 03/22/2021 1118   CREATININE 2.11 (H) 12/18/2019 1425   GLU 152 12/02/2020 0000      Component Value Date/Time   CALCIUM 9.5 03/22/2021 1118   ALKPHOS 122 03/22/2021 1118   AST 15 03/22/2021 1118   ALT 12 03/22/2021 1118   BILITOT 0.2 (L) 03/22/2021 1118         Impression and Plan:   75 year old with:  1.  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  His disease status was updated at this time and treatment choices were discussed.  He developed a dermatological toxicity related to [facility] and he is not willing to take it at this time.  Alternative treatment options including oral targeted therapy with sunitinib or cabozantinib.  I recommended updating his staging scans in the near future and consider alternative therapy at that time.  He is agreeable to proceed with this plan.  We will evaluate his disease status after the next scan and determine best course of action.    2.  Autoimmune complications: He developed autoimmune dermatitis although no additional complications at this time.  3.  Anemia: His hemoglobin remains at baseline.  His anemia is related to malignancy and renal disease.   4.  Follow-up: In 4 weeks for repeat follow-up.   30  minutes were dedicated to this encounter.  The time was spent on reviewing laboratory data, disease status update, addressing complications related to cancer and cancer therapy as well as future plan of care discussion.     Zola Button, MD 12/7/20229:10 AM

## 2021-05-02 DIAGNOSIS — D631 Anemia in chronic kidney disease: Secondary | ICD-10-CM | POA: Diagnosis not present

## 2021-05-02 DIAGNOSIS — E1122 Type 2 diabetes mellitus with diabetic chronic kidney disease: Secondary | ICD-10-CM | POA: Diagnosis not present

## 2021-05-02 DIAGNOSIS — I129 Hypertensive chronic kidney disease with stage 1 through stage 4 chronic kidney disease, or unspecified chronic kidney disease: Secondary | ICD-10-CM | POA: Diagnosis not present

## 2021-05-02 DIAGNOSIS — N184 Chronic kidney disease, stage 4 (severe): Secondary | ICD-10-CM | POA: Diagnosis not present

## 2021-05-02 DIAGNOSIS — N2581 Secondary hyperparathyroidism of renal origin: Secondary | ICD-10-CM | POA: Diagnosis not present

## 2021-05-02 DIAGNOSIS — N189 Chronic kidney disease, unspecified: Secondary | ICD-10-CM | POA: Diagnosis not present

## 2021-05-06 ENCOUNTER — Ambulatory Visit (HOSPITAL_COMMUNITY)
Admission: RE | Admit: 2021-05-06 | Discharge: 2021-05-06 | Disposition: A | Discharge status: Home / Self Care | Payer: Medicare Other | Source: Ambulatory Visit | Attending: Oncology | Admitting: Oncology

## 2021-05-06 ENCOUNTER — Ambulatory Visit (HOSPITAL_COMMUNITY): Payer: Medicare Other

## 2021-05-06 ENCOUNTER — Other Ambulatory Visit: Payer: Self-pay

## 2021-05-06 DIAGNOSIS — C641 Malignant neoplasm of right kidney, except renal pelvis: Secondary | ICD-10-CM

## 2021-05-06 DIAGNOSIS — C787 Secondary malignant neoplasm of liver and intrahepatic bile duct: Secondary | ICD-10-CM | POA: Diagnosis not present

## 2021-05-06 DIAGNOSIS — N281 Cyst of kidney, acquired: Secondary | ICD-10-CM | POA: Diagnosis not present

## 2021-05-06 DIAGNOSIS — R911 Solitary pulmonary nodule: Secondary | ICD-10-CM | POA: Diagnosis not present

## 2021-05-11 ENCOUNTER — Ambulatory Visit (INDEPENDENT_AMBULATORY_CARE_PROVIDER_SITE_OTHER): Payer: Medicare Other | Admitting: *Deleted

## 2021-05-11 ENCOUNTER — Other Ambulatory Visit: Payer: Self-pay

## 2021-05-11 DIAGNOSIS — E118 Type 2 diabetes mellitus with unspecified complications: Secondary | ICD-10-CM

## 2021-05-11 DIAGNOSIS — I10 Essential (primary) hypertension: Secondary | ICD-10-CM

## 2021-05-11 NOTE — Patient Instructions (Signed)
Visit [place], thank you for taking time to talk with me today. Please don't hesitate to contact me if I can be of assistance to you before our next scheduled telephone appointment.  Below are the goals we discussed today:  Patient Self-Care Activities: Patient Antonio Adams will: Take medications as prescribed   Attend all scheduled provider appointments  Call pharmacy for medication refills  Call provider office for new concerns or questions  Continue to check blood sugars at home: continue writing the values down in your notebook: we will review during each of our telephone calls- your blood sugars today look great!  You reported general values between 120-130 with your fasting (first thing in the morning, before eating) values Take the blood sugar log to all doctor visits- this will help your doctor know which medications you should be on and how your medications are working  Continue your efforts to follow a low sugar, low-carbohydrate, heart healthy, low salt diet Continue to check blood pressures at home and write them down on paper - the blood pressures you reported today are all in good range Keep up the great work to prevent falls; continue to use your cane as needed Stay as active as you can without over-doing activity  Our next scheduled telephone follow up visit/ appointment with care management team member is scheduled on:  , Monday, July 11, 2021 at 9:45 am: [place]  If you need to cancel or re-schedule our visit, please call [phone] and our care guide team will be happy to assist you.   I look forward to hearing about your progress.   Oneta Rack, RN, BSN, [place] [phone]: direct office  If you are experiencing a Mental Health or [place] or need someone to talk to, please  call the Suicide and Crisis Lifeline: 988 call the [place] National  Suicide Prevention Lifeline: (458) 308-2809 or TTY: 760-269-1031 TTY 619-694-4807) to talk to a trained counselor call 1-800-273-TALK (toll free, 24 hour hotline) go to [facility] [address] [place] [phone]) call 911   The patient verbalized understanding of instructions, educational materials, and care plan provided today and agreed to receive a mailed copy of patient instructions, educational materials, and care plan  Chronic Kidney Disease, Adult Chronic kidney disease is when lasting damage happens to the kidneys slowly over a long time. The kidneys help to: Make pee (urine). Make hormones. Keep the right amount of fluids and chemicals in the body. Most often, this disease does not go away. You must take steps to help keep the kidney damage from getting worse. If steps are not taken, the kidneys might stop working forever. What are the causes? Diabetes. High blood pressure. Diseases that affect the heart and blood vessels. Other kidney diseases. Diseases of the body's disease-fighting system. A problem with the flow of pee. Infections of the organs that make pee, store it, and take it out of the body. Swelling or irritation of your blood vessels. What increases the risk? Getting older. Having someone in your family who has kidney disease or kidney failure. Having a disease caused by genes. Taking medicines often that harm the kidneys. Being near or having contact with harmful substances. Being very overweight. Using tobacco now or in the past. What are the signs or symptoms? Feeling very tired. Having a swollen face, legs, ankles, or feet. Feeling like you may vomit  or vomiting. Not feeling hungry. Being confused or not able to focus. Twitches and cramps in the leg muscles or other muscles. Dry, itchy skin. A taste of metal in your mouth. Making less pee, or making more pee. Shortness of breath. Trouble sleeping. You  may also become anemic or get weak bones. Anemic means there is not enough red blood cells or hemoglobin in your blood. You may get symptoms slowly. You may not notice them until the kidney damage gets very bad. How is this treated? Often, there is no cure for this disease. Treatment can help with symptoms and help keep the disease from getting worse. You may need to: Avoid alcohol. Avoid foods that are high in salt, potassium, phosphorous, and protein. Take medicines for symptoms and to help control other conditions. Have dialysis. This treatment gets harmful waste out of your body. Treat other problems that cause your kidney disease or make it worse. Follow these instructions at home: Medicines Take over-the-counter and prescription medicines only as told by your doctor. Do not take any new medicines, vitamins, or supplements unless your doctor says it is okay. Lifestyle  Do not smoke or use any products that contain nicotine or tobacco. If you need help quitting, ask your doctor. If you drink alcohol: Limit how much you use to: 0-1 drink a day for women who are not pregnant. 0-2 drinks a day for men. Know how much alcohol is in your drink. In the [place] one drink equals one 12 oz bottle of beer (355 mL), one 5 oz glass of wine (148 mL), or one 1 oz glass of hard liquor (44 mL). Stay at a healthy weight. If you need help losing weight, ask your doctor. General instructions  Follow instructions from your doctor about what you cannot eat or drink. Track your blood pressure at home. Tell your doctor about any changes. If you have diabetes, track your blood sugar. Exercise at least 30 minutes a day, 5 days a week. Keep your shots (vaccinations) up to date. Keep all follow-up visits. Where to find more information American Association of Kidney Patients: BombTimer.gl National Kidney Foundation: www.kidney.[place]: https://mathis.com/ Life Options:  www.lifeoptions.org Kidney School: www.kidneyschool.org Contact a doctor if: Your symptoms get worse. You get new symptoms. Get help right away if: You get symptoms of end-stage kidney disease. These include: Headaches. Losing feeling in your hands or feet. Easy bruising. Having hiccups often. Chest pain. Shortness of breath. Lack of menstrual periods, in women. You have a fever. You make less pee than normal. You have pain or you bleed when you pee or poop. These symptoms may be an emergency. Get help right away. Call your local emergency services (911 in the [place] Do not wait to see if the symptoms will go away. Do not drive yourself to the hospital. Summary Chronic kidney disease is when lasting damage happens to the kidneys slowly over a long time. Causes of this disease include diabetes and high blood pressure. Often, there is no cure for this disease. Treatment can help symptoms and help keep the disease from getting worse. Treatment may involve lifestyle changes, medicines, and dialysis. This information is not intended to replace advice given to you by your health care provider. Make sure you discuss any questions you have with your health care provider. Document Revised: 08/06/2019 Document Reviewed: 08/06/2019 Elsevier Patient Education  [place].

## 2021-05-11 NOTE — Chronic Care Management (AMB) (Signed)
Chronic Care Management   CCM RN Visit Note  05/11/2021 Name: Antonio Adams MRN: [id number] DOB: 1946/04/04  Subjective: Antonio Adams is a 75 y.o. year old male who is a primary care patient of [name], MD. The care management team was consulted for assistance with disease management and care coordination needs.    Engaged with patient by telephone for follow up visit in response to provider referral for case management and/or care coordination services.   Consent to Services:  The patient was given information about Chronic Care Management services, agreed to services, and gave verbal consent prior to initiation of services.  Please see initial visit note for detailed documentation.  Patient agreed to services and verbal consent obtained.   Assessment: Review of patient past medical history, allergies, medications, health status, including review of consultants reports, laboratory and other test data, was performed as part of comprehensive evaluation and provision of chronic care management services.   SDOH (Social Determinants of Health) assessments and interventions performed:  SDOH Interventions    Flowsheet Row Most Recent Value  SDOH Interventions   Food Insecurity Interventions Intervention Not Indicated  Housing Interventions Intervention Not Indicated  Transportation Interventions Intervention Not Indicated  [Patient continues to drive self]      CCM Care Plan Allergies  Allergen Reactions   Ace Inhibitors Swelling and Other (See Comments)   Lisinopril Swelling   Opdivo [Nivolumab] Rash   Outpatient Encounter Medications as of 05/11/2021  Medication Sig Note   acetaminophen (TYLENOL) 500 MG tablet Take 2 tablets (1,000 mg total) by mouth every 6 (six) hours.    amLODipine (NORVASC) 5 MG tablet Take 1 tablet (5 mg total) by mouth daily. 10/18/2020: 10/18/20: Patient reports this medication was recently discontinued by renal provider    aspirin EC 81  MG tablet Take 81 mg by mouth daily. Swallow whole.    atorvastatin (LIPITOR) 40 MG tablet Take 1 tablet (40 mg total) by mouth daily. 10/18/2020: 10/18/20: Patient reports taking one half tablet QD- states this is how [place] PCP instructed him to take   cholecalciferol (VITAMIN D) 1000 units tablet Take 1,000 Units by mouth daily.    Continuous Blood Gluc Receiver (FREESTYLE LIBRE 14 DAY READER) DEVI 1 Act by Does not apply route daily.    Continuous Blood Gluc Sensor (FREESTYLE LIBRE 2 SENSOR) MISC 1 Act by Does not apply route daily.    dimenhyDRINATE (DRAMAMINE) 50 MG tablet Take 50 mg by mouth every 8 (eight) hours as needed for dizziness (vertigo). 10/18/2020: 10/18/20: Patient reports takes prn, OTC for vertigo, reports 2-3 times per day   docusate sodium (COLACE) 100 MG capsule Take 1 capsule (100 mg total) by mouth 2 (two) times daily.    ferrous sulfate 325 (65 FE) MG tablet Take 1 tablet (325 mg total) by mouth 2 (two) times daily. Patient needs office visit for further refills    fluocinonide-emollient (LIDEX-E) 0.05 % cream Apply 1 application topically 2 (two) times daily.    gabapentin (NEURONTIN) 100 MG capsule Take 100 mg by mouth at bedtime.    gemfibrozil (LOPID) 600 MG tablet Take 600 mg by mouth 2 (two) times daily before a meal.    indapamide (LOZOL) 1.25 MG tablet TAKE 1 TABLET BY MOUTH EVERY DAY    Insulin Pen Needle 32G X 6 MM MISC 1 Act by Does not apply route daily.    levocetirizine (XYZAL) 5 MG tablet Take 1 tablet (5 mg total) by mouth every  evening.    Omega-3 Fatty Acids (FISH OIL PO) Take 1 tablet by mouth daily.    ONE TOUCH ULTRA TEST test strip USE 2 (TWO) TIMES DAILY. AND LANCETS 2/DAY 250.03    OVER THE COUNTER MEDICATION Take 1 tablet by mouth 2 (two) times daily. Focus factor 10/18/2020: 10/18/20: Today, patient reports taking QD   polyvinyl alcohol (LIQUIFILM TEARS) 1.4 % ophthalmic solution Place 1 drop into both eyes as needed for dry eyes. 10/18/2020: 10/18/20: Patient  reports uses OTC brand, uses occasionally   prochlorperazine (COMPAZINE) 10 MG tablet Take 1 tablet (10 mg total) by mouth every 6 (six) hours as needed for nausea or vomiting.    solifenacin (VESICARE) 5 MG tablet Take 1 tablet (5 mg total) by mouth daily.    tamsulosin (FLOMAX) 0.4 MG CAPS capsule TAKE 1 CAPSULE (0.4 MG TOTAL) BY MOUTH DAILY. (Patient taking differently: Take 0.4 mg by mouth daily.)    tamsulosin (FLOMAX) 0.4 MG CAPS capsule Take by mouth.    traMADol (ULTRAM) 50 MG tablet Take 50-100 mg by mouth every 6 (six) hours as needed for pain.    No facility-administered encounter medications on file as of 05/11/2021.   Patient Active Problem List   Diagnosis Date Noted   Intrinsic eczema 04/18/2021   Allergic drug reaction 04/18/2021   Atherosclerosis of aorta ([place]) 12/30/2020   Vertigo 09/23/2020   Goals of care, counseling/discussion 05/20/2020   Tinnitus aurium, bilateral 05/20/2020   Metastatic renal cell carcinoma ([place]) 02/26/2020   Diabetic polyneuropathy associated with type 2 diabetes mellitus ([place]) 12/18/2019   CKD (chronic kidney disease) stage 3, GFR 30-59 ml/min ([place]) 10/08/2018   Murmur, cardiac 10/08/2018   DDD (degenerative disc disease), cervical 10/08/2018   OAB (overactive bladder) 04/04/2018   Seasonal allergic rhinitis due to pollen 11/01/2016   Microalbuminuria due to type 2 diabetes mellitus ([place]) 07/27/2016   Varicose veins of both lower extremities 08/06/2015   Varicose veins of lower extremities with ulcer ([place]) 06/16/2015   Internal hemorrhoid, bleeding 08/03/2014   Post traumatic stress disorder (PTSD) 01/29/2014   Radiation proctitis 06/04/2012   Spinal stenosis of lumbar region with radiculopathy 10/30/2011    Class: Chronic   OSA (obstructive sleep apnea) 11/24/2010   Obesity, Class I, BMI 30-34.9 11/24/2010   Routine general medical examination at a health care facility 11/24/2010   Type II diabetes mellitus with manifestations ([place])  03/16/2007   Hyperlipidemia with target LDL less than 100 03/16/2007   Essential hypertension 03/16/2007   GERD 03/16/2007   Conditions to be addressed/monitored:  HTN and DMII  Care Plan : RN Care Manager Plan of Care  Updates made by Knox Royalty, RN since 05/11/2021 12:00 AM     Problem: Chronic Disease Management Needs   Priority: Medium     Long-Range Goal: Development of plan of care for long term chronic disease management   Start Date: 02/07/2021  Expected End Date: 02/07/2022  Priority: Medium  Note:   Current Barriers:  Chronic Disease Management support and education needs related to HTN and DMII  RNCM Clinical Goal(s): Care plan goals re-established and extended 02/07/21: Patient will demonstrate ongoing health management independence for HTN, DMII  through collaboration with RN Care manager, provider, and care team.   Interventions: 1:1 collaboration with primary care provider regarding development and update of comprehensive plan of care as evidenced by provider attestation and co-signature Inter-disciplinary care team collaboration (see longitudinal plan of care) Evaluation of current treatment plan related to  self management and patient's adherence to plan as established by provider  Diabetes:  (Status: Goal on Track (progressing): YES.) Lab Results  Component Value Date   HGBA1C 6.3 12/02/2020  Assessed patient's understanding of A1c goal: <6.5% Provided education to patient about basic DM disease process; Reviewed prescribed diet with patient low carbohydrate, low sugar, heart healthy, low salt- confirmed patient following diet "as best as I can" ; Counseled on importance of regular laboratory monitoring as prescribed;        Discussed plans with patient for ongoing care management follow up and provided patient with direct contact information for care management team;      Reviewed scheduled/upcoming provider appointments including: oncology provider  05/17/20; provided patient with dates/ times of scheduled appointments;         Review of patient status, including review of consultants reports, relevant laboratory and other test results, and medications completed;       Reviewed with patient recent fasting blood sugars at home: he reports general consistent values between 100-130; he denies lows < 80; denies signs/ symptoms hypoglycemia Re- assessed patient's understanding of meaning/ significance of A1-C values: good baseline understanding of same- Reviewed individual historical A1-C trends and provided education around correlation of A1-C value to blood sugar levels at home over 3 months-- he continues to benefit from ongoing support/ reinforcement of self-health management of DMII Discussed recent visit to renal provider: patient continues to report trying to avoid hemodialysis in future; we discussed how self-management of HTN/ DMII will impact kidney function, and I encouraged patient to continue his efforts in self-management of DMII/ HTN Confirmed no medication concerns- patient continues self-managing medications Reviewed previously provided education around self-health management of DM and HTN at home: patient will benefit from ongoing reinforcement/ support  Hypertension: (Status: Goal on Track (progressing): YES.) Last practice recorded BP readings:  BP Readings from Last 3 Encounters:  04/20/21 (!) 152/71  04/18/21 132/82  03/22/21 (!) 159/88  Most recent eGFR/CrCl: No results found for: EGFR  No components found for: CRCL  Evaluation of current treatment plan related to hypertension self management and patient's adherence to plan as established by provider;   Discussed plans with patient for ongoing care management follow up and provided patient with direct contact information for care management team; Advised patient to discuss ongoing previously reported rash from medication reaction with provider; Discussed complications of  poorly controlled blood pressure such as heart disease, stroke, circulatory complications, vision complications, kidney impairment, sexual dysfunction;  Assessed social determinant of health barriers;  Confirmed patient continues monitoring/ writing down on paper blood pressures at home, weekly Reviewed recent blood pressures at home with patient: reports consistent values between 130-160/ 74-88; reports blood pressure this week at: "130/80" Reviewed recent PCP office visit 04/18/21 with patient; he denies questions, reports rash from medication reaction is "a little better," but also tells me he did not take prescribed prednisone "exactly" as prescribed: reported that he missed a few days, but is now almost done with dose-pack: provided education around purpose of prednisone, and shared that his missed doses may be why the rash has not yet completely resolved; confirmed that he has one prescription refill of the topical prescription from 04/18/21 office visit: patient will re-fill with outpatient pharmacy as needed- encouraged patient to schedule another office visit with PCP should rash not resolve promptly, he verbalizes understanding and agreement with plan Confirmed patient continues to live by himself, remains independent in ADL's and iADL's Confirmed patient has had  no recent new falls since our last outreach 02/07/21- positive reinforcement provided: encouraged patient to stay safe/ cognizant of fall risks and to continue using cane as needed/ indicated: he reports today does not need cane "very often"  Patient Goals/Self-Care Activities: As evidenced by review of EHR, collaboration with care team, and patient reporting during CCM RN CM outreach,  Patient Antonio Adams will: Take medications as prescribed   Attend all scheduled provider appointments  Call pharmacy for medication refills  Call provider office for new concerns or questions  Continue to check blood sugars at home: continue writing the  values down in your notebook: we will review during each of our telephone calls- your blood sugars today look great!  You reported general values between 120-130 with your fasting (first thing in the morning, before eating) values Take the blood sugar log to all doctor visits- this will help your doctor know which medications you should be on and how your medications are working  Continue your efforts to follow a low sugar, low-carbohydrate, heart healthy, low salt diet Continue to check blood pressures at home and write them down on paper - the blood pressures you reported today are all in good range Keep up the great work to prevent falls; continue to use your cane as needed Stay as active as you can without over-doing activity     Plan: Telephone follow up appointment with care management team member scheduled for:  Monday, July 11, 2021 at 9:45 am The patient has been provided with contact information for the care management team and has been advised to call with any health related questions or concerns    Oneta Rack, RN, BSN, [place] [phone]: direct office

## 2021-05-14 DIAGNOSIS — I10 Essential (primary) hypertension: Secondary | ICD-10-CM

## 2021-05-14 DIAGNOSIS — E118 Type 2 diabetes mellitus with unspecified complications: Secondary | ICD-10-CM | POA: Diagnosis not present

## 2021-05-16 ENCOUNTER — Emergency Department (HOSPITAL_COMMUNITY)
Admission: EM | Admit: 2021-05-16 | Discharge: 2021-05-16 | Disposition: A | Discharge status: Home / Self Care | Payer: Medicare Other | Attending: Emergency Medicine | Admitting: Emergency Medicine

## 2021-05-16 ENCOUNTER — Other Ambulatory Visit: Payer: Self-pay

## 2021-05-16 ENCOUNTER — Encounter (HOSPITAL_COMMUNITY): Payer: Self-pay

## 2021-05-16 DIAGNOSIS — R1032 Left lower quadrant pain: Secondary | ICD-10-CM | POA: Insufficient documentation

## 2021-05-16 DIAGNOSIS — Z7982 Long term (current) use of aspirin: Secondary | ICD-10-CM | POA: Diagnosis not present

## 2021-05-16 DIAGNOSIS — R1012 Left upper quadrant pain: Secondary | ICD-10-CM | POA: Insufficient documentation

## 2021-05-16 DIAGNOSIS — R197 Diarrhea, unspecified: Secondary | ICD-10-CM | POA: Diagnosis not present

## 2021-05-16 DIAGNOSIS — E860 Dehydration: Secondary | ICD-10-CM | POA: Diagnosis not present

## 2021-05-16 DIAGNOSIS — R21 Rash and other nonspecific skin eruption: Secondary | ICD-10-CM | POA: Diagnosis not present

## 2021-05-16 LAB — COMPREHENSIVE METABOLIC PANEL
ALT: 27 U/L (ref 0–44)
AST: 23 U/L (ref 15–41)
Albumin: 2.9 g/dL — ABNORMAL LOW (ref 3.5–5.0)
Alkaline Phosphatase: 105 U/L (ref 38–126)
Anion gap: 5 (ref 5–15)
BUN: 53 mg/dL — ABNORMAL HIGH (ref 8–23)
CO2: 13 mmol/L — ABNORMAL LOW (ref 22–32)
Calcium: 8.8 mg/dL — ABNORMAL LOW (ref 8.9–10.3)
Chloride: 115 mmol/L — ABNORMAL HIGH (ref 98–111)
Creatinine, Ser: 4.35 mg/dL — ABNORMAL HIGH (ref 0.61–1.24)
GFR, Estimated: 13 mL/min — ABNORMAL LOW (ref >60)
Glucose, Bld: 84 mg/dL (ref 70–99)
Potassium: 5.2 mmol/L — ABNORMAL HIGH (ref 3.5–5.1)
Sodium: 133 mmol/L — ABNORMAL LOW (ref 135–145)
Total Bilirubin: 0.5 mg/dL (ref 0.3–1.2)
Total Protein: 6.2 g/dL — ABNORMAL LOW (ref 6.5–8.1)

## 2021-05-16 LAB — TYPE AND SCREEN
ABO/RH(D): O POS
Antibody Screen: NEGATIVE

## 2021-05-16 LAB — URINALYSIS, ROUTINE W REFLEX MICROSCOPIC
Bilirubin Urine: NEGATIVE
Glucose, UA: NEGATIVE mg/dL
Ketones, ur: NEGATIVE mg/dL
Leukocytes,Ua: NEGATIVE
Nitrite: NEGATIVE
Protein, ur: 100 mg/dL — AB
Specific Gravity, Urine: 1.015 (ref 1.005–1.030)
pH: 5 (ref 5.0–8.0)

## 2021-05-16 LAB — LIPASE, BLOOD: Lipase: 38 U/L (ref 11–51)

## 2021-05-16 LAB — CBC
HCT: 26.8 % — ABNORMAL LOW (ref 39.0–52.0)
Hemoglobin: 8.7 g/dL — ABNORMAL LOW (ref 13.0–17.0)
MCH: 30.5 pg (ref 26.0–34.0)
MCHC: 32.5 g/dL (ref 30.0–36.0)
MCV: 94 fL (ref 80.0–100.0)
Platelets: 255 10*3/uL (ref 150–400)
RBC: 2.85 MIL/uL — ABNORMAL LOW (ref 4.22–5.81)
RDW: 15.2 % (ref 11.5–15.5)
WBC: 4.9 10*3/uL (ref 4.0–10.5)
nRBC: 0 % (ref 0.0–0.2)

## 2021-05-16 LAB — POC OCCULT BLOOD, ED: Fecal Occult Bld: NEGATIVE

## 2021-05-16 MED ORDER — SODIUM CHLORIDE 0.9 % IV BOLUS
500.0000 mL | Freq: Once | INTRAVENOUS | Status: AC
  Administered 2021-05-16: 500 mL via INTRAVENOUS

## 2021-05-16 NOTE — ED Notes (Signed)
Patient has no concerns after AVS has been reviewed and patient education provided. Patient discharged. 

## 2021-05-16 NOTE — ED Triage Notes (Signed)
Pt reports black and tarry loose stools for 2 weeks. He also reports progressively worsening weakness and mild abdominal pain. Denies N/V.

## 2021-05-16 NOTE — ED Provider Notes (Signed)
[facility] DEPT Provider Note   CSN: [id number] Arrival date & time: 05/16/21  1651     History This is a 76 year old patient with a history of abdominal surgeries to include open right renal cell metastatectomy and adrenalectomy as well as an open partial hepatectomy and cholecystectomy who presents to the ED for evaluation of 2-week history of diarrhea and left-sided abdominal pain.  Patient has been having approximately 2-3 episodes of diarrhea a day.  He states that the last few episodes have been very dark.  He had a colonoscopy done about 5 years ago that patient self reports was normal.  No history of diverticulosis or diverticulitis.  Chief Complaint  Patient presents with   Rectal Bleeding    Antonio Adams is a 76 y.o. male.   Rectal Bleeding Associated symptoms: abdominal pain   Associated symptoms: no fever and no vomiting       Home Medications Prior to Admission medications   Medication Sig Start Date End Date Taking? Authorizing Provider  acetaminophen (TYLENOL) 500 MG tablet Take 2 tablets (1,000 mg total) by mouth every 6 (six) hours. 03/03/20   [name], MD  amLODipine (NORVASC) 5 MG tablet Take 1 tablet (5 mg total) by mouth daily. 05/23/20   [name], MD  aspirin EC 81 MG tablet Take 81 mg by mouth daily. Swallow whole.    [provider]  atorvastatin (LIPITOR) 40 MG tablet Take 1 tablet (40 mg total) by mouth daily. 12/20/19   [name], MD  cholecalciferol (VITAMIN D) 1000 units tablet Take 1,000 Units by mouth daily.    [provider]  Continuous Blood Gluc Receiver (FREESTYLE LIBRE 14 DAY READER) DEVI 1 Act by Does not apply route daily. 05/23/20   [name], MD  Continuous Blood Gluc Sensor (FREESTYLE LIBRE 2 SENSOR) MISC 1 Act by Does not apply route daily. 05/23/20   [name], MD  dimenhyDRINATE (DRAMAMINE) 50 MG tablet Take 50 mg by mouth every 8 (eight) hours as needed  for dizziness (vertigo).    [name], MD  docusate sodium (COLACE) 100 MG capsule Take 1 capsule (100 mg total) by mouth 2 (two) times daily. 03/03/20   [name], MD  ferrous sulfate 325 (65 FE) MG tablet Take 1 tablet (325 mg total) by mouth 2 (two) times daily. Patient needs office visit for further refills 02/24/19   Irene Shipper, MD  fluocinonide-emollient (LIDEX-E) 0.05 % cream Apply 1 application topically 2 (two) times daily. 04/18/21   [name], MD  gabapentin (NEURONTIN) 100 MG capsule Take 100 mg by mouth at bedtime.    [provider]  gemfibrozil (LOPID) 600 MG tablet Take 600 mg by mouth 2 (two) times daily before a meal.    [provider]  indapamide (LOZOL) 1.25 MG tablet TAKE 1 TABLET BY MOUTH EVERY DAY 12/20/20   [name], MD  Insulin Pen Needle 32G X 6 MM MISC 1 Act by Does not apply route daily. 05/23/20   [name], MD  levocetirizine (XYZAL) 5 MG tablet Take 1 tablet (5 mg total) by mouth every evening. 04/18/21   [name], MD  Omega-3 Fatty Acids (FISH OIL PO) Take 1 tablet by mouth daily.    [provider]  ONE TOUCH ULTRA TEST test strip USE 2 (TWO) TIMES DAILY. AND LANCETS 2/DAY 250.03 11/05/16   [name], MD  OVER THE  COUNTER MEDICATION Take 1 tablet by mouth 2 (two) times daily. Focus factor    [provider]  polyvinyl alcohol (LIQUIFILM TEARS) 1.4 % ophthalmic solution Place 1 drop into both eyes as needed for dry eyes.    [provider]  prochlorperazine (COMPAZINE) 10 MG tablet Take 1 tablet (10 mg total) by mouth every 6 (six) hours as needed for nausea or vomiting. 01/13/21   Wyatt Portela, MD  solifenacin (VESICARE) 5 MG tablet Take 1 tablet (5 mg total) by mouth daily. 12/28/20   [name], MD  tamsulosin (FLOMAX) 0.4 MG CAPS capsule TAKE 1 CAPSULE (0.4 MG TOTAL) BY MOUTH DAILY. Patient taking differently: Take 0.4 mg by mouth daily. 01/20/14   [name], MD   tamsulosin (FLOMAX) 0.4 MG CAPS capsule Take by mouth. 07/11/20   [provider]  traMADol (ULTRAM) 50 MG tablet Take 50-100 mg by mouth every 6 (six) hours as needed for pain. 06/03/20   [provider]      Allergies    Ace inhibitors, Lisinopril, and Opdivo [nivolumab]    Review of Systems   Review of Systems  Constitutional:  Negative for fever.  HENT: Negative.    Eyes: Negative.   Respiratory:  Negative for shortness of breath.   Cardiovascular: Negative.   Gastrointestinal:  Positive for abdominal pain, diarrhea and hematochezia. Negative for constipation, rectal pain and vomiting.  Endocrine: Negative.   Genitourinary: Negative.   Musculoskeletal: Negative.   Skin:  Negative for rash.  Neurological:  Negative for headaches.  All other systems reviewed and are negative.  Physical Exam Updated Vital Signs BP 127/70    Pulse 84    Temp 99.3 F (37.4 C) (Oral)    Resp 18    Ht 6' (1.829 m)    Wt 95.3 kg    SpO2 99%    BMI 28.48 kg/m  Physical Exam Vitals and nursing note reviewed.  Constitutional:      General: He is not in acute distress.    Appearance: He is not ill-appearing.  HENT:     Head: Atraumatic.  Eyes:     Conjunctiva/sclera: Conjunctivae normal.  Cardiovascular:     Rate and Rhythm: Normal rate and regular rhythm.     Pulses: Normal pulses.     Heart sounds: No murmur heard. Pulmonary:     Effort: Pulmonary effort is normal. No respiratory distress.     Breath sounds: Normal breath sounds.  Abdominal:     General: Abdomen is flat. There is no distension.     Palpations: Abdomen is soft.     Tenderness: There is abdominal tenderness.     Comments: Abdomen soft nondistended.  Hyperactive bowel sounds.  Mild tenderness to palpation in the left upper and lower quadrant.  Negative Murphy sign.  Negative McBurney.  Genitourinary:    Rectum: Normal. Guaiac result negative.     Comments: No internal or external hemorrhoids. Stool appears  light brown Musculoskeletal:        General: Normal range of motion.     Cervical back: Normal range of motion.  Skin:    General: Skin is warm and dry.     Capillary Refill: Capillary refill takes less than 2 seconds.     Comments: Rash on right shoulder  Neurological:     General: No focal deficit present.     Mental Status: He is alert.  Psychiatric:        Mood and Affect:  Mood normal.    ED Results / Procedures / Treatments   Labs (all labs ordered are listed, but only abnormal results are displayed) Labs Reviewed  COMPREHENSIVE METABOLIC PANEL - Abnormal; Notable for the following components:      Result Value   Sodium 133 (*)    Potassium 5.2 (*)    Chloride 115 (*)    CO2 13 (*)    BUN 53 (*)    Creatinine, Ser 4.35 (*)    Calcium 8.8 (*)    Total Protein 6.2 (*)    Albumin 2.9 (*)    GFR, Estimated 13 (*)    All other components within normal limits  CBC - Abnormal; Notable for the following components:   RBC 2.85 (*)    Hemoglobin 8.7 (*)    HCT 26.8 (*)    All other components within normal limits  URINALYSIS, ROUTINE W REFLEX MICROSCOPIC - Abnormal; Notable for the following components:   Hgb urine dipstick SMALL (*)    Protein, ur 100 (*)    Bacteria, UA RARE (*)    All other components within normal limits  LIPASE, BLOOD  POC OCCULT BLOOD, ED  TYPE AND SCREEN    EKG None  Radiology No results found.  Procedures Procedures    Medications Ordered in ED Medications - No data to display  ED Course/ Medical Decision Making/ A&P                           Medical Decision Making  This patient presents to the ED for concern of diarrhea and abdominal pain, this involves an extensive number of treatment options, and is a complaint that carries with it a high risk of complications and morbidity.  The differential diagnosis includes The differential diagnosis for generalized abdominal pain includes, but is not limited to AAA, gastroenteritis,  appendicitis, Bowel obstruction, Bowel perforation. Gastroparesis, DKA, Hernia, Inflammatory bowel disease, mesenteric ischemia, pancreatitis, peritonitis SBP, volvulus. Patient was ambulated and had mild orthostatic vital signs during evaluation.    Additional history obtained:  Additional history obtained from wife External records from outside source obtained and reviewed including previous surgery records and recent CT abdomen/chest   Lab Tests:  I Ordered, reviewed, and interpreted labs.  The pertinent results include: Lipase negative, fecal occult negative, CBC without elevated white blood cell count, hemoglobin at baseline; UA unremarkable, and CMP with several electrolyte abnormalities and elevated creatinine, however this appears at his baseline as well given his history of kidney failure and surgery.   Imaging Studies ordered:  No imaging indicated at this time   Cardiac Monitoring:  The patient was maintained on a cardiac monitor.  I personally viewed and interpreted the cardiac monitored which showed an underlying rhythm of: NSR   Medicines ordered and prescription drug management:  I ordered medication including NS 500 ml bolus for dehydration Reevaluation of the patient after these medicines showed that the patient improved.  I have reviewed the patients home medicines and have made adjustments as needed  Disposition:  After consideration of the diagnostic results and the patients response to treatment feel that the patent would benefit from discharge home with outpatient follow up.   Diarrhea -given patient's physical exam and work-up, colitis is likely not cause of patient's symptoms.  It appears that he has a noninfectious cause of diarrhea.  He was also recently started on Xyzal for a chemotherapy induced rash on his shoulder which can  induce diarrhea.  Per Dr. Eulis Foster, patient should d/c Xyzal and use hydrocortisone cream on the rash for now until his diarrhea is  under control. Pt to discharge home with outpatient follow-up in 3 days.  In the meantime, he can use Imodium or Kaopectate to help with his diarrhea.  He is also been taking Pepto-Bismol which he can discontinue. All patient and wife's questions have been asked and answered. All labs and imaging discussed with patient and his wife. They are understanding and amenable to plan. Discharged home in good condition.  Final Clinical Impression(s) / ED Diagnoses Final diagnoses:  Diarrhea, unspecified type  Dehydration    Rx / DC Orders ED Discharge Orders     None         Rodena Piety 05/16/21 2210    Daleen Bo, MD 05/18/21 1148    Daleen Bo, MD 05/18/21 1150

## 2021-05-16 NOTE — Discharge Instructions (Addendum)
Your work-up today was very reassuring.  Your labs appear to be at baseline, and your stool was negative for blood.  I recommend that you stop taking the Xyzal which can cause diarrhea, as well as stop the Pepto.  You can use hydrocortisone on the rash on your shoulders which can help resolve it.  You can use Imodium or Kaopectate to help with your diarrhea symptoms.  Please follow-up with your primary care doctor in about 3 days for reevaluation or sooner if your symptoms do not resolve.

## 2021-05-16 NOTE — ED Provider Notes (Signed)
°  Face-to-face evaluation   History: Here for diarrhea for 2 to 3 weeks.  Physical exam: Alert, calm, cooperative.  Family member with the patient.  His abdomen soft nontender to palpation.  He was able to stand without feeling dizzy but he did feel weak.  He has mild orthostasis when standing.  IV fluid bolus ordered.  MDM: Evaluation for  Chief Complaint  Patient presents with   Rectal Bleeding     Patient with subacute diarrhea, 2 to 3-day, currently using Pepto-Bismol.  He is taking Xyzal for rash, following a chemotherapy treatment.  He has not seen any blood in the stool but it does look dark at times.  He is not vomiting.  He has no focal weakness or paresthesia.  Laboratory evaluation indicates stable chronic renal insufficiency and mildly elevated potassium.  No other worrisome issues.  Recommend stopping Xyzal, use Kaopectate or Imodium for diarrhea, use hydrocortisone cream for rash.  Short-term follow-up to recheck labs.  Medical screening examination/treatment/procedure(s) were conducted as a shared visit with non-physician practitioner(s) and myself.  I personally evaluated the patient during the encounter    Daleen Bo, MD 05/18/21 1150

## 2021-05-16 NOTE — ED Provider Notes (Signed)
Emergency Medicine Provider Triage Evaluation Note  Antonio Adams , a 76 y.o. male  was evaluated in triage.  Pt complains of back and tarry diarrhea x3 weeks associated with abdominal pain. No nausea or vomiting. No previous GI bleed. Takes ASA 81mg , but not other blood thinners. No urinary symptoms.   Review of Systems  Positive: Abdominal pain Negative: N/V  Physical Exam  BP 122/85 (BP Location: Left Arm)    Pulse 93    Temp 99.7 F (37.6 C) (Oral)    Resp 14    Ht 5\' 10"  (1.778 m)    Wt 91.6 kg    SpO2 98%    BMI 28.98 kg/m  Gen:   Awake, no distress   Resp:  Normal effort  MSK:   Moves extremities without difficulty  Other:    Medical Decision Making  Medically screening exam initiated at 5:20 PM.  Appropriate orders placed.  HAWTHORNE DAY was informed that the remainder of the evaluation will be completed by another provider, this initial triage assessment does not replace that evaluation, and the importance of remaining in the ED until their evaluation is complete.  Labs Type and screen Will defer abdominal imaging to provider in back given GFR   Suzy Bouchard, PA-C 05/16/21 1721    Drenda Freeze, MD 05/16/21 2158

## 2021-05-17 ENCOUNTER — Ambulatory Visit: Payer: Medicare Other | Admitting: Oncology

## 2021-05-17 ENCOUNTER — Other Ambulatory Visit: Payer: Medicare Other

## 2021-05-17 ENCOUNTER — Telehealth: Payer: Self-pay | Admitting: Internal Medicine

## 2021-05-17 ENCOUNTER — Ambulatory Visit: Payer: Medicare Other

## 2021-05-17 NOTE — Telephone Encounter (Signed)
Connected to [facility] 1.2.2023.   Caller states he has had diarrhea for almost two weeks. No fever.  Advised to go to ED.

## 2021-05-20 ENCOUNTER — Ambulatory Visit (INDEPENDENT_AMBULATORY_CARE_PROVIDER_SITE_OTHER): Payer: Medicare Other | Admitting: Nurse Practitioner

## 2021-05-20 ENCOUNTER — Emergency Department (HOSPITAL_COMMUNITY): Payer: Medicare Other

## 2021-05-20 ENCOUNTER — Other Ambulatory Visit: Payer: Self-pay

## 2021-05-20 ENCOUNTER — Emergency Department (HOSPITAL_COMMUNITY)
Admission: EM | Admit: 2021-05-20 | Discharge: 2021-05-21 | Discharge status: Against Medical Advice | Payer: Medicare Other | Source: Home / Self Care

## 2021-05-20 ENCOUNTER — Encounter (HOSPITAL_COMMUNITY): Payer: Self-pay

## 2021-05-20 ENCOUNTER — Telehealth: Payer: Self-pay

## 2021-05-20 VITALS — BP 110/68 | HR 106 | Wt 186.1 lb

## 2021-05-20 DIAGNOSIS — E11649 Type 2 diabetes mellitus with hypoglycemia without coma: Secondary | ICD-10-CM | POA: Diagnosis not present

## 2021-05-20 DIAGNOSIS — T782XXA Anaphylactic shock, unspecified, initial encounter: Secondary | ICD-10-CM | POA: Diagnosis not present

## 2021-05-20 DIAGNOSIS — I959 Hypotension, unspecified: Secondary | ICD-10-CM | POA: Diagnosis not present

## 2021-05-20 DIAGNOSIS — E871 Hypo-osmolality and hyponatremia: Secondary | ICD-10-CM | POA: Diagnosis not present

## 2021-05-20 DIAGNOSIS — Z659 Problem related to unspecified psychosocial circumstances: Secondary | ICD-10-CM | POA: Insufficient documentation

## 2021-05-20 DIAGNOSIS — Z0389 Encounter for observation for other suspected diseases and conditions ruled out: Secondary | ICD-10-CM | POA: Insufficient documentation

## 2021-05-20 DIAGNOSIS — H43819 Vitreous degeneration, unspecified eye: Secondary | ICD-10-CM | POA: Insufficient documentation

## 2021-05-20 DIAGNOSIS — R6889 Other general symptoms and signs: Secondary | ICD-10-CM | POA: Diagnosis not present

## 2021-05-20 DIAGNOSIS — R197 Diarrhea, unspecified: Secondary | ICD-10-CM | POA: Diagnosis not present

## 2021-05-20 DIAGNOSIS — R0602 Shortness of breath: Secondary | ICD-10-CM | POA: Diagnosis not present

## 2021-05-20 DIAGNOSIS — N185 Chronic kidney disease, stage 5: Secondary | ICD-10-CM | POA: Diagnosis not present

## 2021-05-20 DIAGNOSIS — K521 Toxic gastroenteritis and colitis: Secondary | ICD-10-CM | POA: Diagnosis not present

## 2021-05-20 DIAGNOSIS — R0600 Dyspnea, unspecified: Secondary | ICD-10-CM | POA: Diagnosis not present

## 2021-05-20 DIAGNOSIS — Z5321 Procedure and treatment not carried out due to patient leaving prior to being seen by health care provider: Secondary | ICD-10-CM | POA: Insufficient documentation

## 2021-05-20 DIAGNOSIS — R680 Hypothermia, not associated with low environmental temperature: Secondary | ICD-10-CM | POA: Diagnosis not present

## 2021-05-20 DIAGNOSIS — E860 Dehydration: Secondary | ICD-10-CM | POA: Diagnosis not present

## 2021-05-20 DIAGNOSIS — E872 Acidosis, unspecified: Secondary | ICD-10-CM | POA: Diagnosis not present

## 2021-05-20 DIAGNOSIS — H04123 Dry eye syndrome of bilateral lacrimal glands: Secondary | ICD-10-CM | POA: Insufficient documentation

## 2021-05-20 DIAGNOSIS — E1140 Type 2 diabetes mellitus with diabetic neuropathy, unspecified: Secondary | ICD-10-CM | POA: Diagnosis not present

## 2021-05-20 DIAGNOSIS — F32A Depression, unspecified: Secondary | ICD-10-CM | POA: Diagnosis not present

## 2021-05-20 DIAGNOSIS — I499 Cardiac arrhythmia, unspecified: Secondary | ICD-10-CM | POA: Diagnosis not present

## 2021-05-20 DIAGNOSIS — L603 Nail dystrophy: Secondary | ICD-10-CM | POA: Insufficient documentation

## 2021-05-20 DIAGNOSIS — I120 Hypertensive chronic kidney disease with stage 5 chronic kidney disease or end stage renal disease: Secondary | ICD-10-CM | POA: Diagnosis not present

## 2021-05-20 DIAGNOSIS — E1121 Type 2 diabetes mellitus with diabetic nephropathy: Secondary | ICD-10-CM | POA: Insufficient documentation

## 2021-05-20 DIAGNOSIS — R000 Tachycardia, unspecified: Secondary | ICD-10-CM | POA: Diagnosis not present

## 2021-05-20 DIAGNOSIS — E876 Hypokalemia: Secondary | ICD-10-CM | POA: Diagnosis not present

## 2021-05-20 DIAGNOSIS — M1990 Unspecified osteoarthritis, unspecified site: Secondary | ICD-10-CM | POA: Insufficient documentation

## 2021-05-20 DIAGNOSIS — I2510 Atherosclerotic heart disease of native coronary artery without angina pectoris: Secondary | ICD-10-CM | POA: Diagnosis not present

## 2021-05-20 DIAGNOSIS — A419 Sepsis, unspecified organism: Secondary | ICD-10-CM | POA: Diagnosis not present

## 2021-05-20 DIAGNOSIS — E1122 Type 2 diabetes mellitus with diabetic chronic kidney disease: Secondary | ICD-10-CM | POA: Diagnosis not present

## 2021-05-20 DIAGNOSIS — C787 Secondary malignant neoplasm of liver and intrahepatic bile duct: Secondary | ICD-10-CM | POA: Diagnosis not present

## 2021-05-20 DIAGNOSIS — K6289 Other specified diseases of anus and rectum: Secondary | ICD-10-CM | POA: Insufficient documentation

## 2021-05-20 DIAGNOSIS — Z20822 Contact with and (suspected) exposure to covid-19: Secondary | ICD-10-CM | POA: Diagnosis not present

## 2021-05-20 DIAGNOSIS — F419 Anxiety disorder, unspecified: Secondary | ICD-10-CM | POA: Diagnosis present

## 2021-05-20 DIAGNOSIS — R6510 Systemic inflammatory response syndrome (SIRS) of non-infectious origin without acute organ dysfunction: Secondary | ICD-10-CM | POA: Diagnosis not present

## 2021-05-20 DIAGNOSIS — I700 Atherosclerosis of aorta: Secondary | ICD-10-CM | POA: Diagnosis not present

## 2021-05-20 DIAGNOSIS — R21 Rash and other nonspecific skin eruption: Secondary | ICD-10-CM | POA: Diagnosis not present

## 2021-05-20 DIAGNOSIS — Z743 Need for continuous supervision: Secondary | ICD-10-CM | POA: Diagnosis not present

## 2021-05-20 DIAGNOSIS — R112 Nausea with vomiting, unspecified: Secondary | ICD-10-CM | POA: Diagnosis not present

## 2021-05-20 DIAGNOSIS — E559 Vitamin D deficiency, unspecified: Secondary | ICD-10-CM | POA: Insufficient documentation

## 2021-05-20 DIAGNOSIS — R6520 Severe sepsis without septic shock: Secondary | ICD-10-CM | POA: Diagnosis not present

## 2021-05-20 DIAGNOSIS — K828 Other specified diseases of gallbladder: Secondary | ICD-10-CM | POA: Diagnosis not present

## 2021-05-20 DIAGNOSIS — E785 Hyperlipidemia, unspecified: Secondary | ICD-10-CM | POA: Diagnosis not present

## 2021-05-20 DIAGNOSIS — E46 Unspecified protein-calorie malnutrition: Secondary | ICD-10-CM | POA: Diagnosis not present

## 2021-05-20 DIAGNOSIS — Z86010 Personal history of colonic polyps: Secondary | ICD-10-CM | POA: Insufficient documentation

## 2021-05-20 DIAGNOSIS — N400 Enlarged prostate without lower urinary tract symptoms: Secondary | ICD-10-CM | POA: Diagnosis present

## 2021-05-20 DIAGNOSIS — K0263 Dental caries on smooth surface penetrating into pulp: Secondary | ICD-10-CM | POA: Insufficient documentation

## 2021-05-20 DIAGNOSIS — R799 Abnormal finding of blood chemistry, unspecified: Secondary | ICD-10-CM | POA: Insufficient documentation

## 2021-05-20 DIAGNOSIS — D72819 Decreased white blood cell count, unspecified: Secondary | ICD-10-CM | POA: Insufficient documentation

## 2021-05-20 DIAGNOSIS — R579 Shock, unspecified: Secondary | ICD-10-CM | POA: Diagnosis not present

## 2021-05-20 DIAGNOSIS — H2510 Age-related nuclear cataract, unspecified eye: Secondary | ICD-10-CM | POA: Insufficient documentation

## 2021-05-20 DIAGNOSIS — R710 Precipitous drop in hematocrit: Secondary | ICD-10-CM | POA: Diagnosis not present

## 2021-05-20 DIAGNOSIS — D631 Anemia in chronic kidney disease: Secondary | ICD-10-CM | POA: Diagnosis not present

## 2021-05-20 DIAGNOSIS — N179 Acute kidney failure, unspecified: Secondary | ICD-10-CM | POA: Diagnosis not present

## 2021-05-20 DIAGNOSIS — L259 Unspecified contact dermatitis, unspecified cause: Secondary | ICD-10-CM | POA: Insufficient documentation

## 2021-05-20 DIAGNOSIS — M205X9 Other deformities of toe(s) (acquired), unspecified foot: Secondary | ICD-10-CM | POA: Insufficient documentation

## 2021-05-20 DIAGNOSIS — K036 Deposits [accretions] on teeth: Secondary | ICD-10-CM | POA: Insufficient documentation

## 2021-05-20 LAB — CBC WITH DIFFERENTIAL/PLATELET
Basophils Absolute: 0 10*3/uL (ref 0.0–0.1)
Basophils Relative: 0.3 % (ref 0.0–3.0)
Eosinophils Absolute: 0 10*3/uL (ref 0.0–0.7)
Eosinophils Relative: 0.4 % (ref 0.0–5.0)
HCT: 30.8 % — ABNORMAL LOW (ref 39.0–52.0)
Hemoglobin: 10.3 g/dL — ABNORMAL LOW (ref 13.0–17.0)
Lymphocytes Relative: 6.7 % — ABNORMAL LOW (ref 12.0–46.0)
Lymphs Abs: 0.4 10*3/uL — ABNORMAL LOW (ref 0.7–4.0)
MCHC: 33.3 g/dL (ref 30.0–36.0)
MCV: 91.5 fl (ref 78.0–100.0)
Monocytes Absolute: 1 10*3/uL (ref 0.1–1.0)
Monocytes Relative: 14.9 % — ABNORMAL HIGH (ref 3.0–12.0)
Neutro Abs: 5.1 10*3/uL (ref 1.4–7.7)
Neutrophils Relative %: 77.7 % — ABNORMAL HIGH (ref 43.0–77.0)
Platelets: 326 10*3/uL (ref 150.0–400.0)
RBC: 3.37 Mil/uL — ABNORMAL LOW (ref 4.22–5.81)
RDW: 14.8 % (ref 11.5–15.5)
WBC: 6.5 10*3/uL (ref 4.0–10.5)

## 2021-05-20 LAB — COMPREHENSIVE METABOLIC PANEL
ALT: 25 U/L (ref 0–53)
AST: 21 U/L (ref 0–37)
Albumin: 3.4 g/dL — ABNORMAL LOW (ref 3.5–5.2)
Alkaline Phosphatase: 113 U/L (ref 39–117)
BUN: 57 mg/dL — ABNORMAL HIGH (ref 6–23)
CO2: 11 mEq/L — ABNORMAL LOW (ref 19–32)
Calcium: 9.5 mg/dL (ref 8.4–10.5)
Chloride: 115 mEq/L — ABNORMAL HIGH (ref 96–112)
Creatinine, Ser: 4.67 mg/dL (ref 0.40–1.50)
GFR: 11.62 mL/min — CL (ref >60.00)
Glucose, Bld: 95 mg/dL (ref 70–99)
Potassium: 4.8 mEq/L (ref 3.5–5.1)
Sodium: 133 mEq/L — ABNORMAL LOW (ref 135–145)
Total Bilirubin: 0.2 mg/dL (ref 0.2–1.2)
Total Protein: 6.1 g/dL (ref 6.0–8.3)

## 2021-05-20 NOTE — ED Triage Notes (Signed)
Pt called by PCP and notified of abnormal kidney functions.

## 2021-05-20 NOTE — ED Provider Triage Note (Addendum)
Emergency Medicine Provider Triage Evaluation Note  Antonio Adams , a 76 y.o. male  was evaluated in triage.  Pt sent from primary care due to abnormal labs.  Has a history of kidney disease, not on dialysis and he and his wife are called saying to go to the emergency room.  Patient and his wife also report multiple weeks of diarrhea, decreased oral intake, weight loss and shortness of breath.  History of liver and kidney cancer.  He was receiving injections to treat his cancer however had a bad reaction to the second 3 rounds so these were discontinued.  They are unsure of next steps.  Review of Systems  Positive:  Negative: Chest pain, extremity edema  Physical Exam  BP 117/75 (BP Location: Left Arm)    Pulse (!) 114    Temp 98.4 F (36.9 C) (Oral)    Resp 18    Ht 6' (1.829 m)    Wt 84.4 kg    SpO2 97%    BMI 25.23 kg/m  Gen:   Awake, no distress   Resp:  Normal effort  MSK:   Moves extremities without difficulty  Other:  Patient tachycardic, normal rhythm.  Lung sounds clear.  Medical Decision Making  Medically screening exam initiated at 8:51 PM.  Appropriate orders placed.  [name] was informed that the remainder of the evaluation will be completed by another provider, this initial triage assessment does not replace that evaluation, and the importance of remaining in the ED until their evaluation is complete.     [name] 05/20/21 2054  Labs deferred because patient had the same ones earlier today.  Creatinine 4.67, around patient's baseline.  GFR 11.6.  Patient is not on dialysis.  His labs are consistent with his results a few days ago.  Chest x-ray and EKG ordered due to shortness of breath   [name], PA-C 05/20/21 2145

## 2021-05-20 NOTE — Telephone Encounter (Signed)
Critical Labs:  Received at 4:53pm  Creatine 4.67   GFR 11.62

## 2021-05-20 NOTE — Progress Notes (Signed)
Subjective:  Patient ID: Antonio Adams, male    DOB: 09-May-1946  Age: 76 y.o. MRN: [id number]  CC:  Chief Complaint  Patient presents with   Hospitalization Follow-up    Still not feeling any better, diarrhea for almost two weeks, dehydration  Requesting a  cream for a ongoing rash.      HPI  This patient arrives today for ED visit follow-up.  He has been experiencing diarrhea for approximately 3 weeks.  He tells me he will have 5-6 loose stools in 24-hour period.  He was seen in the hospital a few days ago and had multipl electrolyte abnormalities noted on blood work including mild hyperkalemia, mild hyponatremia, elevated creatinine and low EGFR (creatinine and EGFR do appear to be chronically low).  He developed a rash after most recent oncological treatment, and was recommended to try Xyzal to treat the rash.  Since starting the Xyzal he started having diarrhea.  He has since stopped taking the Xyzal and has tried over-the-counter Kaopectate without much improvement in the diarrhea.  His wife accompanies to him today and she tells me he has been trying to focus on hydration but has not had much of an appetite.  There was concern that he may have had bleeding in his stool when he went to the emergency department but his Hemoccult was negative.  Today, he tells me that he still is not feeling very well.  He denies fever but he does feel Short of breath at times and feels like his heart is fluttering intermittently.  Per his wife he has not tried Imodium.  Past Medical History:  Diagnosis Date   ADENOCARCINOMA, PROSTATE, GLEASON GRADE 6 12/21/2009   prostate cancer   ALLERGIC RHINITIS 03/16/2007   Allergy    Anxiety    Arthritis    back    BACK PAIN WITH RADICULOPATHY 11/23/2008   Cancer of kidney ([place])    partial right kidney removed   CARPAL TUNNEL SYNDROME, BILATERAL 03/16/2007   Chronic back pain    Constipation    takes Senokot daily   Depression    occasionally    DIABETES MELLITUS, TYPE II 03/16/2007   takes Januvia and MEtformin daily   GASTROINTESTINAL HEMORRHAGE, HX OF 03/16/2007   GERD 03/16/2007   takes Omeprazole daily   Glaucoma    mild - no eye drops   Hemorrhoids    History of colon polyps    HYPERLIPIDEMIA 03/16/2007   takes Crestor daily   HYPERTENSION 03/16/2007   takes Diltiazem and Lisinopril daily    Kidney cancer, primary, with metastasis from kidney to other site [facility])    LEG CRAMPS 05/22/2007   Nocturia    Overactive bladder    PARESTHESIA 05/21/2007   Proctitis    PTSD (post-traumatic stress disorder)    wakes up may be dreaming of fighting   Rectal bleeding    Dr.Norins has explained its from the Radiation that he has received   Rectal bleeding    Renal cell carcinoma ([place])    Sleep apnea    uses CPAP nightly   Stomach cancer ([place])    Urinary frequency    takes Toviaz daily      Family History  Problem Relation Age of Onset   Cancer Sister 64       Cancer, unsure type   Hypertension Other    Colon cancer Neg Hx    Esophageal cancer Neg Hx    Stomach cancer Neg  Hx    Diabetes Neg Hx    Colon polyps Neg Hx    Rectal cancer Neg Hx     Social History   Social History Narrative   Married-divorced '89, remarried '96. 2 sons, 1 daughter. 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 He wishes to be a full resuscitation candidate.       Social History   Tobacco Use   Smoking status: Never   Smokeless tobacco: Never  Substance Use Topics   Alcohol use: Not Currently    Alcohol/week: 0.0 standard drinks     Current Meds  Medication Sig   acetaminophen (TYLENOL) 500 MG tablet Take 2 tablets (1,000 mg total) by mouth every 6 (six) hours.   amLODipine (NORVASC) 5 MG tablet Take 1 tablet (5 mg total) by mouth daily.   aspirin EC 81 MG tablet Take 81 mg by mouth daily. Swallow whole.   atorvastatin (LIPITOR) 40 MG tablet Take 1 tablet (40 mg total) by mouth  daily.   cholecalciferol (VITAMIN D) 1000 units tablet Take 1,000 Units by mouth daily.   Continuous Blood Gluc Receiver (FREESTYLE LIBRE 14 DAY READER) DEVI 1 Act by Does not apply route daily.   Continuous Blood Gluc Sensor (FREESTYLE LIBRE 2 SENSOR) MISC 1 Act by Does not apply route daily.   dimenhyDRINATE (DRAMAMINE) 50 MG tablet Take 50 mg by mouth every 8 (eight) hours as needed for dizziness (vertigo).   docusate sodium (COLACE) 100 MG capsule Take 1 capsule (100 mg total) by mouth 2 (two) times daily.   ferrous sulfate 325 (65 FE) MG tablet Take 1 tablet (325 mg total) by mouth 2 (two) times daily. Patient needs office visit for further refills   fluocinonide-emollient (LIDEX-E) 0.05 % cream Apply 1 application topically 2 (two) times daily.   gabapentin (NEURONTIN) 100 MG capsule Take 100 mg by mouth at bedtime.   gemfibrozil (LOPID) 600 MG tablet Take 600 mg by mouth 2 (two) times daily before a meal.   indapamide (LOZOL) 1.25 MG tablet TAKE 1 TABLET BY MOUTH EVERY DAY   Insulin Pen Needle 32G X 6 MM MISC 1 Act by Does not apply route daily.   Omega-3 Fatty Acids (FISH OIL PO) Take 1 tablet by mouth daily.   ONE TOUCH ULTRA TEST test strip USE 2 (TWO) TIMES DAILY. AND LANCETS 2/DAY 250.03   OVER THE COUNTER MEDICATION Take 1 tablet by mouth 2 (two) times daily. Focus factor   polyvinyl alcohol (LIQUIFILM TEARS) 1.4 % ophthalmic solution Place 1 drop into both eyes as needed for dry eyes.   prochlorperazine (COMPAZINE) 10 MG tablet Take 1 tablet (10 mg total) by mouth every 6 (six) hours as needed for nausea or vomiting.   solifenacin (VESICARE) 5 MG tablet Take 1 tablet (5 mg total) by mouth daily.   tamsulosin (FLOMAX) 0.4 MG CAPS capsule TAKE 1 CAPSULE (0.4 MG TOTAL) BY MOUTH DAILY. (Patient taking differently: Take 0.4 mg by mouth daily.)   tamsulosin (FLOMAX) 0.4 MG CAPS capsule Take by mouth.   traMADol (ULTRAM) 50 MG tablet Take 50-100 mg by mouth every 6 (six) hours as needed  for pain.   [DISCONTINUED] levocetirizine (XYZAL) 5 MG tablet Take 1 tablet (5 mg total) by mouth every evening.    ROS:  Review of Systems  Constitutional:  Negative for chills and fever.  Respiratory:  Positive for shortness of breath.   Cardiovascular:  Positive for palpitations.  Negative for chest pain.  Neurological:  Negative for dizziness.    Objective:   Today's Vitals: BP 110/68 (BP Location: Left Arm, Patient Position: Sitting, Cuff Size: Large)    Pulse (!) 106    Wt 186 lb 1.9 oz (84.4 kg)    SpO2 99%    BMI 25.24 kg/m  Vitals with BMI 05/20/2021 05/16/2021 05/16/2021  Height - - -  Weight 186 lbs 2 oz - -  BMI 44.31 - -  Systolic 540 086 761  Diastolic 68 66 68  Pulse 950 83 86     Physical Exam Vitals reviewed.  Constitutional:      Appearance: Normal appearance.  HENT:     Head: Normocephalic and atraumatic.  Cardiovascular:     Rate and Rhythm: Normal rate and regular rhythm.  Pulmonary:     Effort: Pulmonary effort is normal.     Breath sounds: Normal breath sounds.  Abdominal:     General: Abdomen is flat. Bowel sounds are normal.     Palpations: Abdomen is soft.     Tenderness: There is generalized abdominal tenderness.  Musculoskeletal:     Cervical back: Neck supple.  Skin:    General: Skin is warm and dry.     Comments: Generalized macular rash, mostly located bilateral hands and upper arms.  Patient does report it is pruritic.  Neurological:     Mental Status: He is alert and oriented to person, place, and time.  Psychiatric:        Mood and Affect: Mood normal.        Behavior: Behavior normal.        Thought Content: Thought content normal.        Judgment: Judgment normal.         Assessment and Plan   1. Diarrhea, unspecified type   2. Rash      Plan: 1.  Still unclear etiology.  Will order stool tests including culture and test for C. difficile.  We will also check metabolic panel and CBC today.  I did recommend that he focus on  hydrating and try to prevent dehydration by drinking water regularly throughout the day.  I also recommended he eat bland foods over the next few days as well.  We will order referral to gastroenterology in case the diarrhea continues to persist.  Recommended he stop the Kaopectate since does not seem to help and try Imodium.  We also discussed that he go to the hospital if he starts to experience severe fatigue, frequent cardiac palpitations, shortness of breath, chest pain, dizziness, headaches, etc.  He and his wife tell me they understand. 2.  Recommend he continue using hydrocortisone cream as needed for his rash.   Tests ordered Orders Placed This Encounter  Procedures   Stool Culture   Clostridium Difficile by PCR([facility])   Comprehensive metabolic panel   CBC with Differential/Platelet   Ambulatory referral to Gastroenterology      No orders of the defined types were placed in this encounter.   Patient to follow-up in 1 week for close monitoring, or sooner as needed.  Ailene Ards, NP

## 2021-05-21 NOTE — ED Notes (Signed)
Returned pt identification labels and reported they are leaving to go home. Alert, oriented and ambulatory.

## 2021-05-23 ENCOUNTER — Emergency Department (HOSPITAL_COMMUNITY): Payer: Medicare Other

## 2021-05-23 ENCOUNTER — Inpatient Hospital Stay (HOSPITAL_COMMUNITY)
Admission: EM | Admit: 2021-05-23 | Discharge: 2021-05-29 | DRG: 641 | Disposition: A | Discharge status: Home Health | Payer: Medicare Other | Attending: Internal Medicine | Admitting: Internal Medicine

## 2021-05-23 ENCOUNTER — Other Ambulatory Visit: Payer: Self-pay

## 2021-05-23 ENCOUNTER — Encounter (HOSPITAL_COMMUNITY): Payer: Self-pay

## 2021-05-23 DIAGNOSIS — C787 Secondary malignant neoplasm of liver and intrahepatic bile duct: Secondary | ICD-10-CM | POA: Diagnosis present

## 2021-05-23 DIAGNOSIS — N185 Chronic kidney disease, stage 5: Secondary | ICD-10-CM | POA: Diagnosis not present

## 2021-05-23 DIAGNOSIS — Z20822 Contact with and (suspected) exposure to covid-19: Secondary | ICD-10-CM | POA: Diagnosis present

## 2021-05-23 DIAGNOSIS — I2510 Atherosclerotic heart disease of native coronary artery without angina pectoris: Secondary | ICD-10-CM | POA: Diagnosis present

## 2021-05-23 DIAGNOSIS — Z6825 Body mass index (BMI) 25.0-25.9, adult: Secondary | ICD-10-CM

## 2021-05-23 DIAGNOSIS — Z8711 Personal history of peptic ulcer disease: Secondary | ICD-10-CM

## 2021-05-23 DIAGNOSIS — Z8546 Personal history of malignant neoplasm of prostate: Secondary | ICD-10-CM

## 2021-05-23 DIAGNOSIS — Z79899 Other long term (current) drug therapy: Secondary | ICD-10-CM

## 2021-05-23 DIAGNOSIS — R6889 Other general symptoms and signs: Secondary | ICD-10-CM | POA: Diagnosis not present

## 2021-05-23 DIAGNOSIS — E1136 Type 2 diabetes mellitus with diabetic cataract: Secondary | ICD-10-CM | POA: Diagnosis present

## 2021-05-23 DIAGNOSIS — F32A Depression, unspecified: Secondary | ICD-10-CM | POA: Diagnosis present

## 2021-05-23 DIAGNOSIS — E860 Dehydration: Secondary | ICD-10-CM | POA: Diagnosis not present

## 2021-05-23 DIAGNOSIS — E876 Hypokalemia: Secondary | ICD-10-CM | POA: Diagnosis not present

## 2021-05-23 DIAGNOSIS — R197 Diarrhea, unspecified: Secondary | ICD-10-CM | POA: Diagnosis present

## 2021-05-23 DIAGNOSIS — E871 Hypo-osmolality and hyponatremia: Secondary | ICD-10-CM | POA: Diagnosis present

## 2021-05-23 DIAGNOSIS — R6520 Severe sepsis without septic shock: Secondary | ICD-10-CM

## 2021-05-23 DIAGNOSIS — R112 Nausea with vomiting, unspecified: Secondary | ICD-10-CM | POA: Diagnosis present

## 2021-05-23 DIAGNOSIS — R6510 Systemic inflammatory response syndrome (SIRS) of non-infectious origin without acute organ dysfunction: Secondary | ICD-10-CM | POA: Diagnosis not present

## 2021-05-23 DIAGNOSIS — R680 Hypothermia, not associated with low environmental temperature: Secondary | ICD-10-CM | POA: Diagnosis present

## 2021-05-23 DIAGNOSIS — N400 Enlarged prostate without lower urinary tract symptoms: Secondary | ICD-10-CM | POA: Diagnosis present

## 2021-05-23 DIAGNOSIS — R710 Precipitous drop in hematocrit: Secondary | ICD-10-CM | POA: Diagnosis not present

## 2021-05-23 DIAGNOSIS — E785 Hyperlipidemia, unspecified: Secondary | ICD-10-CM | POA: Diagnosis present

## 2021-05-23 DIAGNOSIS — F419 Anxiety disorder, unspecified: Secondary | ICD-10-CM | POA: Diagnosis present

## 2021-05-23 DIAGNOSIS — E11649 Type 2 diabetes mellitus with hypoglycemia without coma: Secondary | ICD-10-CM | POA: Diagnosis present

## 2021-05-23 DIAGNOSIS — Z794 Long term (current) use of insulin: Secondary | ICD-10-CM

## 2021-05-23 DIAGNOSIS — Z905 Acquired absence of kidney: Secondary | ICD-10-CM

## 2021-05-23 DIAGNOSIS — E872 Acidosis, unspecified: Secondary | ICD-10-CM | POA: Diagnosis present

## 2021-05-23 DIAGNOSIS — K521 Toxic gastroenteritis and colitis: Secondary | ICD-10-CM

## 2021-05-23 DIAGNOSIS — R579 Shock, unspecified: Secondary | ICD-10-CM | POA: Diagnosis present

## 2021-05-23 DIAGNOSIS — Z86010 Personal history of colonic polyps: Secondary | ICD-10-CM

## 2021-05-23 DIAGNOSIS — I120 Hypertensive chronic kidney disease with stage 5 chronic kidney disease or end stage renal disease: Secondary | ICD-10-CM | POA: Diagnosis present

## 2021-05-23 DIAGNOSIS — E1140 Type 2 diabetes mellitus with diabetic neuropathy, unspecified: Secondary | ICD-10-CM | POA: Diagnosis present

## 2021-05-23 DIAGNOSIS — Z85528 Personal history of other malignant neoplasm of kidney: Secondary | ICD-10-CM

## 2021-05-23 DIAGNOSIS — Z981 Arthrodesis status: Secondary | ICD-10-CM

## 2021-05-23 DIAGNOSIS — E46 Unspecified protein-calorie malnutrition: Secondary | ICD-10-CM | POA: Diagnosis present

## 2021-05-23 DIAGNOSIS — D631 Anemia in chronic kidney disease: Secondary | ICD-10-CM | POA: Diagnosis present

## 2021-05-23 DIAGNOSIS — A419 Sepsis, unspecified organism: Secondary | ICD-10-CM

## 2021-05-23 DIAGNOSIS — E861 Hypovolemia: Secondary | ICD-10-CM

## 2021-05-23 DIAGNOSIS — I700 Atherosclerosis of aorta: Secondary | ICD-10-CM | POA: Diagnosis not present

## 2021-05-23 DIAGNOSIS — E1122 Type 2 diabetes mellitus with diabetic chronic kidney disease: Secondary | ICD-10-CM | POA: Diagnosis present

## 2021-05-23 DIAGNOSIS — K828 Other specified diseases of gallbladder: Secondary | ICD-10-CM | POA: Diagnosis not present

## 2021-05-23 DIAGNOSIS — T782XXA Anaphylactic shock, unspecified, initial encounter: Secondary | ICD-10-CM | POA: Diagnosis not present

## 2021-05-23 DIAGNOSIS — I499 Cardiac arrhythmia, unspecified: Secondary | ICD-10-CM | POA: Diagnosis not present

## 2021-05-23 DIAGNOSIS — R000 Tachycardia, unspecified: Secondary | ICD-10-CM | POA: Diagnosis not present

## 2021-05-23 DIAGNOSIS — Z888 Allergy status to other drugs, medicaments and biological substances status: Secondary | ICD-10-CM

## 2021-05-23 DIAGNOSIS — N179 Acute kidney failure, unspecified: Secondary | ICD-10-CM | POA: Diagnosis present

## 2021-05-23 DIAGNOSIS — Z743 Need for continuous supervision: Secondary | ICD-10-CM | POA: Diagnosis not present

## 2021-05-23 DIAGNOSIS — G4730 Sleep apnea, unspecified: Secondary | ICD-10-CM | POA: Diagnosis present

## 2021-05-23 DIAGNOSIS — R0600 Dyspnea, unspecified: Secondary | ICD-10-CM | POA: Diagnosis not present

## 2021-05-23 DIAGNOSIS — I10 Essential (primary) hypertension: Secondary | ICD-10-CM | POA: Diagnosis present

## 2021-05-23 DIAGNOSIS — Z923 Personal history of irradiation: Secondary | ICD-10-CM

## 2021-05-23 DIAGNOSIS — Z7982 Long term (current) use of aspirin: Secondary | ICD-10-CM

## 2021-05-23 DIAGNOSIS — C649 Malignant neoplasm of unspecified kidney, except renal pelvis: Secondary | ICD-10-CM | POA: Diagnosis present

## 2021-05-23 DIAGNOSIS — K219 Gastro-esophageal reflux disease without esophagitis: Secondary | ICD-10-CM | POA: Diagnosis present

## 2021-05-23 DIAGNOSIS — Z8249 Family history of ischemic heart disease and other diseases of the circulatory system: Secondary | ICD-10-CM

## 2021-05-23 LAB — CBC WITH DIFFERENTIAL/PLATELET
Abs Immature Granulocytes: 0.04 10*3/uL (ref 0.00–0.07)
Basophils Absolute: 0 10*3/uL (ref 0.0–0.1)
Basophils Relative: 0 %
Eosinophils Absolute: 0 10*3/uL (ref 0.0–0.5)
Eosinophils Relative: 0 %
HCT: 33.2 % — ABNORMAL LOW (ref 39.0–52.0)
Hemoglobin: 11.2 g/dL — ABNORMAL LOW (ref 13.0–17.0)
Immature Granulocytes: 0 %
Lymphocytes Relative: 4 %
Lymphs Abs: 0.4 10*3/uL — ABNORMAL LOW (ref 0.7–4.0)
MCH: 31 pg (ref 26.0–34.0)
MCHC: 33.7 g/dL (ref 30.0–36.0)
MCV: 92 fL (ref 80.0–100.0)
Monocytes Absolute: 1.1 10*3/uL — ABNORMAL HIGH (ref 0.1–1.0)
Monocytes Relative: 11 %
Neutro Abs: 8.6 10*3/uL — ABNORMAL HIGH (ref 1.7–7.7)
Neutrophils Relative %: 85 %
Platelets: 338 10*3/uL (ref 150–400)
RBC: 3.61 MIL/uL — ABNORMAL LOW (ref 4.22–5.81)
RDW: 15.7 % — ABNORMAL HIGH (ref 11.5–15.5)
WBC: 10.2 10*3/uL (ref 4.0–10.5)
nRBC: 0.3 % — ABNORMAL HIGH (ref 0.0–0.2)

## 2021-05-23 LAB — GASTROINTESTINAL PANEL BY PCR, STOOL (REPLACES STOOL CULTURE)

## 2021-05-23 LAB — URINALYSIS, ROUTINE W REFLEX MICROSCOPIC
Bilirubin Urine: NEGATIVE
Glucose, UA: NEGATIVE mg/dL
Ketones, ur: NEGATIVE mg/dL
Leukocytes,Ua: NEGATIVE
Nitrite: NEGATIVE
Protein, ur: 100 mg/dL — AB
Specific Gravity, Urine: 1.012 (ref 1.005–1.030)
pH: 5 (ref 5.0–8.0)

## 2021-05-23 LAB — APTT: aPTT: 22 seconds — ABNORMAL LOW (ref 24–36)

## 2021-05-23 LAB — COMPREHENSIVE METABOLIC PANEL
ALT: 30 U/L (ref 0–44)
AST: 22 U/L (ref 15–41)
Albumin: 3.1 g/dL — ABNORMAL LOW (ref 3.5–5.0)
Alkaline Phosphatase: 111 U/L (ref 38–126)
Anion gap: 8 (ref 5–15)
BUN: 74 mg/dL — ABNORMAL HIGH (ref 8–23)
CO2: 9 mmol/L — ABNORMAL LOW (ref 22–32)
Calcium: 9.2 mg/dL (ref 8.9–10.3)
Chloride: 117 mmol/L — ABNORMAL HIGH (ref 98–111)
Creatinine, Ser: 5.8 mg/dL — ABNORMAL HIGH (ref 0.61–1.24)
GFR, Estimated: 10 mL/min — ABNORMAL LOW (ref >60)
Glucose, Bld: 114 mg/dL — ABNORMAL HIGH (ref 70–99)
Potassium: 4.8 mmol/L (ref 3.5–5.1)
Sodium: 134 mmol/L — ABNORMAL LOW (ref 135–145)
Total Bilirubin: 0.4 mg/dL (ref 0.3–1.2)
Total Protein: 6.5 g/dL (ref 6.5–8.1)

## 2021-05-23 LAB — RENAL FUNCTION PANEL
Albumin: 2.7 g/dL — ABNORMAL LOW (ref 3.5–5.0)
Anion gap: 10 (ref 5–15)
BUN: 72 mg/dL — ABNORMAL HIGH (ref 8–23)
CO2: 11 mmol/L — ABNORMAL LOW (ref 22–32)
Calcium: 8.8 mg/dL — ABNORMAL LOW (ref 8.9–10.3)
Chloride: 119 mmol/L — ABNORMAL HIGH (ref 98–111)
Creatinine, Ser: 5.48 mg/dL — ABNORMAL HIGH (ref 0.61–1.24)
GFR, Estimated: 10 mL/min — ABNORMAL LOW (ref >60)
Glucose, Bld: 94 mg/dL (ref 70–99)
Phosphorus: 6.6 mg/dL — ABNORMAL HIGH (ref 2.5–4.6)
Potassium: 4.7 mmol/L (ref 3.5–5.1)
Sodium: 140 mmol/L (ref 135–145)

## 2021-05-23 LAB — LACTIC ACID, PLASMA: Lactic Acid, Venous: 1 mmol/L (ref 0.5–1.9)

## 2021-05-23 LAB — C DIFFICILE QUICK SCREEN W PCR REFLEX
C Diff antigen: NEGATIVE
C Diff interpretation: NOT DETECTED
C Diff toxin: NEGATIVE

## 2021-05-23 LAB — TROPONIN I (HIGH SENSITIVITY)
Troponin I (High Sensitivity): 24 ng/L — ABNORMAL HIGH (ref <18)
Troponin I (High Sensitivity): 38 ng/L — ABNORMAL HIGH (ref <18)

## 2021-05-23 LAB — RESP PANEL BY RT-PCR (FLU A&B, COVID) ARPGX2
Influenza A by PCR: NEGATIVE
Influenza B by PCR: NEGATIVE
SARS Coronavirus 2 by RT PCR: NEGATIVE

## 2021-05-23 LAB — GLUCOSE, CAPILLARY
Glucose-Capillary: 60 mg/dL — ABNORMAL LOW (ref 70–99)
Glucose-Capillary: 64 mg/dL — ABNORMAL LOW (ref 70–99)
Glucose-Capillary: 64 mg/dL — ABNORMAL LOW (ref 70–99)
Glucose-Capillary: 73 mg/dL (ref 70–99)
Glucose-Capillary: 77 mg/dL (ref 70–99)

## 2021-05-23 LAB — HEMOGLOBIN A1C
Hgb A1c MFr Bld: 5.8 % — ABNORMAL HIGH (ref 4.8–5.6)
Mean Plasma Glucose: 119.76 mg/dL

## 2021-05-23 LAB — LIPASE, BLOOD: Lipase: 48 U/L (ref 11–51)

## 2021-05-23 LAB — PROTIME-INR
INR: 1.2 (ref 0.8–1.2)
Prothrombin Time: 14.9 seconds (ref 11.4–15.2)

## 2021-05-23 LAB — D-DIMER, QUANTITATIVE: D-Dimer, Quant: 1.5 ug/mL-FEU — ABNORMAL HIGH (ref 0.00–0.50)

## 2021-05-23 MED ORDER — ATORVASTATIN CALCIUM 20 MG PO TABS
20.0000 mg | ORAL_TABLET | Freq: Every day | ORAL | Status: DC
  Administered 2021-05-23 – 2021-05-28 (×6): 20 mg via ORAL
  Filled 2021-05-23 (×6): qty 1

## 2021-05-23 MED ORDER — SODIUM CHLORIDE 0.9 % IV SOLN
2.0000 g | Freq: Every day | INTRAVENOUS | Status: DC
  Administered 2021-05-24: 2 g via INTRAVENOUS
  Filled 2021-05-23: qty 2

## 2021-05-23 MED ORDER — SODIUM CHLORIDE 0.9 % IV SOLN
2.0000 g | Freq: Once | INTRAVENOUS | Status: AC
  Administered 2021-05-23: 2 g via INTRAVENOUS
  Filled 2021-05-23: qty 2

## 2021-05-23 MED ORDER — HEPARIN SODIUM (PORCINE) 5000 UNIT/ML IJ SOLN
5000.0000 [IU] | Freq: Three times a day (TID) | INTRAMUSCULAR | Status: DC
  Administered 2021-05-23 – 2021-05-28 (×16): 5000 [IU] via SUBCUTANEOUS
  Filled 2021-05-23 (×18): qty 1

## 2021-05-23 MED ORDER — LACTATED RINGERS IV SOLN: INTRAVENOUS | Status: DC

## 2021-05-23 MED ORDER — ACETAMINOPHEN 325 MG PO TABS: 650.0000 mg | ORAL_TABLET | Freq: Four times a day (QID) | ORAL | Status: DC | PRN

## 2021-05-23 MED ORDER — TAMSULOSIN HCL 0.4 MG PO CAPS
0.4000 mg | ORAL_CAPSULE | Freq: Every day | ORAL | Status: DC
Start: 2021-05-23 — End: 2021-05-29
  Administered 2021-05-23 – 2021-05-29 (×7): 0.4 mg via ORAL
  Filled 2021-05-23 (×7): qty 1

## 2021-05-23 MED ORDER — DEXTROSE-NACL 5-0.45 % IV SOLN: INTRAVENOUS | Status: DC

## 2021-05-23 MED ORDER — METRONIDAZOLE 500 MG/100ML IV SOLN
500.0000 mg | Freq: Once | INTRAVENOUS | Status: AC
  Administered 2021-05-23: 500 mg via INTRAVENOUS
  Filled 2021-05-23: qty 100

## 2021-05-23 MED ORDER — VANCOMYCIN HCL IN DEXTROSE 1-5 GM/200ML-% IV SOLN: 1000.0000 mg | Freq: Once | INTRAVENOUS | Status: DC

## 2021-05-23 MED ORDER — METRONIDAZOLE 500 MG/100ML IV SOLN
500.0000 mg | Freq: Two times a day (BID) | INTRAVENOUS | Status: DC
  Administered 2021-05-23 – 2021-05-24 (×2): 500 mg via INTRAVENOUS
  Filled 2021-05-23 (×2): qty 100

## 2021-05-23 MED ORDER — ONDANSETRON HCL 4 MG/2ML IJ SOLN: 4.0000 mg | Freq: Once | INTRAMUSCULAR | Status: DC

## 2021-05-23 MED ORDER — VANCOMYCIN HCL 1500 MG/300ML IV SOLN
1500.0000 mg | Freq: Once | INTRAVENOUS | Status: AC
  Administered 2021-05-23: 1500 mg via INTRAVENOUS
  Filled 2021-05-23: qty 300

## 2021-05-23 MED ORDER — DARIFENACIN HYDROBROMIDE ER 7.5 MG PO TB24
7.5000 mg | ORAL_TABLET | Freq: Every day | ORAL | Status: DC
  Administered 2021-05-23 – 2021-05-29 (×7): 7.5 mg via ORAL
  Filled 2021-05-23 (×7): qty 1

## 2021-05-23 MED ORDER — LACTATED RINGERS IV BOLUS
1000.0000 mL | Freq: Once | INTRAVENOUS | Status: AC
  Administered 2021-05-23: 1000 mL via INTRAVENOUS

## 2021-05-23 MED ORDER — ASPIRIN EC 81 MG PO TBEC
81.0000 mg | DELAYED_RELEASE_TABLET | Freq: Every day | ORAL | Status: DC
Start: 2021-05-23 — End: 2021-05-29
  Administered 2021-05-23 – 2021-05-29 (×7): 81 mg via ORAL
  Filled 2021-05-23 (×7): qty 1

## 2021-05-23 MED ORDER — LACTATED RINGERS IV BOLUS
1000.0000 mL | Freq: Once | INTRAVENOUS | Status: AC
Start: 2021-05-23 — End: 2021-05-23
  Administered 2021-05-23: 1000 mL via INTRAVENOUS

## 2021-05-23 MED ORDER — INSULIN ASPART 100 UNIT/ML IJ SOLN: 0.0000 [IU] | Freq: Three times a day (TID) | INTRAMUSCULAR | Status: DC

## 2021-05-23 MED ORDER — ACETAMINOPHEN 650 MG RE SUPP: 650.0000 mg | Freq: Four times a day (QID) | RECTAL | Status: DC | PRN

## 2021-05-23 MED ORDER — SODIUM BICARBONATE 650 MG PO TABS: 650.0000 mg | ORAL_TABLET | Freq: Three times a day (TID) | ORAL | Status: DC

## 2021-05-23 MED ORDER — PROCHLORPERAZINE EDISYLATE 10 MG/2ML IJ SOLN
10.0000 mg | Freq: Four times a day (QID) | INTRAMUSCULAR | Status: DC | PRN
  Administered 2021-05-24 – 2021-05-26 (×4): 10 mg via INTRAVENOUS
  Filled 2021-05-23 (×4): qty 2

## 2021-05-23 MED ORDER — SODIUM CHLORIDE 0.45 % IV SOLN
INTRAVENOUS | Status: DC
  Filled 2021-05-23 (×7): qty 75

## 2021-05-23 MED ORDER — POLYVINYL ALCOHOL 1.4 % OP SOLN: 1.0000 [drp] | OPHTHALMIC | Status: DC | PRN

## 2021-05-23 NOTE — ED Notes (Signed)
Condom catheter and sterile collection bag still in place. No urine output post fluid bolus.

## 2021-05-23 NOTE — ED Notes (Signed)
With CT 

## 2021-05-23 NOTE — Progress Notes (Signed)
Pharmacy Antibiotic Note  Antonio Adams is a 76 y.o. male with renal cell carcinoma on chemo and CKD IV admitted on 05/23/2021 with sepsis due to unknown source and diarrhea.  Pharmacy has been consulted for cefepime dosing.  Plan: Cefepime 2 g iv q 24 hours  Metronidazole 500 mg IV q 8 hours per MD  Pharmacy will sign off and follow remotely.   Height: 6' (182.9 cm) Weight: 84.4 kg (186 lb) IBW/kg (Calculated) : 77.6  Temp (24hrs), Avg:97.1 F (36.2 C), Min:96.2 F (35.7 C), Max:97.5 F (36.4 C)  Recent Labs  Lab 05/16/21 1804 05/20/21 1525 05/23/21 0422  WBC 4.9 6.5 10.2  CREATININE 4.35* 4.67* 5.80*  LATICACIDVEN  --   --  1.0    Estimated Creatinine Clearance: 12.1 mL/min (A) (by C-G formula based on SCr of 5.8 mg/dL (H)).    Allergies  Allergen Reactions   Ace Inhibitors Swelling and Other (See Comments)    lisinopril   Opdivo [Nivolumab] Rash    Antimicrobials this admission: 1/9 vancomycin x 1  1/9 cefepime >>  1/9 Flagyl >>   Dose adjustments this admission:  Microbiology results: 1/9 BCx:  1/9 UCx:   1/9 C diff: neg 1/9 GI PCR:   Thank you for allowing pharmacy to be a part of this patients care.  Ulice Dash D 05/23/2021 9:55 AM

## 2021-05-23 NOTE — Progress Notes (Signed)
Pt being followed by [facility] for Sepsis protocol. 

## 2021-05-23 NOTE — H&P (Signed)
History and Physical    Antonio Adams YHC:[id number] DOB: 1946/03/21 DOA: 05/23/2021  PCP: [name], MD  Patient coming from: Home  Chief Complaint: diarrhea  HPI: Antonio Adams is a 76 y.o. male with medical history significant of renal cell carcinoma, CKD4, HTN, DM2. Presenting with diarrhea. Symptoms started 3 weeks ago. His wife reports that he had chemo that cause some unspecified reaction. He started taking xyzal for that reaction. The diarrhea began once he took xyzal and it has not stopped. He has stopped taking xyzal at this point, but he is still having N/V/D. He tried imodium and keopectate, but they did not help. He has felt more weak and lightheaded. Since his symptoms did not resolve this morning, he decided to come to the ED for help.    ED Course: He was found to be hypotensive. He was given fluids boluses. His SCr was up. He appeared septic. He was started on sepsis protocol. Stool studies were obtained. [facility] was called for admission.    Review of Systems:  Denies CP, palpitation, dyspnea, ab pain, syncopal episodes, sick contacts, fever. Review of systems is otherwise negative for all not mentioned in HPI.   PMHx Past Medical History:  Diagnosis Date   ADENOCARCINOMA, PROSTATE, GLEASON GRADE 6 12/21/2009   prostate cancer   ALLERGIC RHINITIS 03/16/2007   Allergy    Anxiety    Arthritis    back    BACK PAIN WITH RADICULOPATHY 11/23/2008   Cancer of kidney ([place])    partial right kidney removed   CARPAL TUNNEL SYNDROME, BILATERAL 03/16/2007   Chronic back pain    Constipation    takes Senokot daily   Depression    occasionally   DIABETES MELLITUS, TYPE II 03/16/2007   takes Januvia and MEtformin daily   GASTROINTESTINAL HEMORRHAGE, HX OF 03/16/2007   GERD 03/16/2007   takes Omeprazole daily   Glaucoma    mild - no eye drops   Hemorrhoids    History of colon polyps    HYPERLIPIDEMIA 03/16/2007   takes Crestor daily   HYPERTENSION 03/16/2007   takes  Diltiazem and Lisinopril daily    Kidney cancer, primary, with metastasis from kidney to other site [facility])    LEG CRAMPS 05/22/2007   Nocturia    Overactive bladder    PARESTHESIA 05/21/2007   Proctitis    PTSD (post-traumatic stress disorder)    wakes up may be dreaming of fighting   Rectal bleeding    Dr.Norins has explained its from the Radiation that he has received   Rectal bleeding    Renal cell carcinoma ([place])    Sleep apnea    uses CPAP nightly   Stomach cancer ([place])    Urinary frequency    takes Toviaz daily    PSHx Past Surgical History:  Procedure Laterality Date   ADRENALECTOMY Right 02/26/2020   Procedure: OPEN RIGHT RENAL CELL METASTATECTOMY AND  ADRENALECTOMY;  Surgeon: [name], MD;  Location: [facility];  Service: Urology;  Laterality: Right;   [place], 2008   repeat surgery; ESI '08   [place]     bilateral   CERVICAL FUSION     COLONOSCOPY     colonosocpy     ESOPHAGOGASTRODUODENOSCOPY     ED   FLEXIBLE SIGMOIDOSCOPY N/A 01/08/2017   Procedure: FLEXIBLE SIGMOIDOSCOPY;  Surgeon: Irene Shipper, MD;  Location: [place];  Service: Endoscopy;  Laterality: N/A;   HOT HEMOSTASIS N/A  01/08/2017   Procedure: HOT HEMOSTASIS (ARGON PLASMA COAGULATION/BICAP);  Surgeon: Irene Shipper, MD;  Location[facility];  Service: Endoscopy;  Laterality: N/A;   IR RADIOLOGIST EVAL & MGMT  12/17/2019   IR RADIOLOGIST EVAL & MGMT  07/01/2020   IR RADIOLOGIST EVAL & MGMT  08/25/2020   LUMBAR LAMINECTOMY  10/30/2011   Procedure: MICRODISCECTOMY LUMBAR LAMINECTOMY;  Surgeon: Jessy Oto, MD;  Location: [place];  Service: Orthopedics;  Laterality: Right;  Right L4-5 and L5-S1 Microdiscectomy   LUMBAR LAMINECTOMY  06/17/2012   Procedure: MICRODISCECTOMY LUMBAR LAMINECTOMY;  Surgeon: Jessy Oto, MD;  Location: [place];  Service: Orthopedics;  Laterality: N/A;  Right L5-S1 microdiscectomy   OPEN PARTIAL HEPATECTOMY  N/A 02/26/2020   Procedure: OPEN PARTIAL  HEPATECTOMY, CHOLECYSTECTOMY;  Surgeon: Stark Klein, MD;  Location: [facility];  Service: General;  Laterality: N/A;   POLYPECTOMY     PROSTATE SURGERY  march 2012   seed implant   RADIOFREQUENCY ABLATION N/A 07/21/2020   Procedure: CT MICROWAVE ABLATION;  Surgeon: Criselda Peaches, MD;  Location: [facility];  Service: Anesthesiology;  Laterality: N/A;   ROBOTIC ASSITED PARTIAL NEPHRECTOMY Right 09/08/2015   Procedure: XI ROBOTIC ASSITED RIGHT PARTIAL NEPHRECTOMY;  Surgeon: [name], MD;  Location[facility];  Service: Urology;  Laterality: Right;  Clamp on: 1033 Clamp off: 1103 Total Clamp Time: 30 minutes   SHOULDER ARTHROSCOPY WITH SUBACROMIAL DECOMPRESSION Left 01/20/2016   Procedure: LEFT SHOULDER ARTHROSCOPY WITH EXTENSIVE  DEBRIDEMENT, ACROMIOPLASTY;  Surgeon: Ninetta Lights, MD;  Location: [place];  Service: Orthopedics;  Laterality: Left;   SHOULDER SURGERY  LFT   Stress Cardiolite  12/24/2003   negative for ischemia   UPPER ESOPHAGEAL ENDOSCOPIC ULTRASOUND (EUS)  04/18/2016   [facility]   UPPER GASTROINTESTINAL ENDOSCOPY     WOUND EXPLORATION     management of a wound that he sustained in the [employer]    SocHx  reports that he has never smoked. He has never used smokeless tobacco. He reports that he does not currently use alcohol. He reports that he does not use drugs.  Allergies  Allergen Reactions   Ace Inhibitors Swelling and Other (See Comments)    lisinopril   Opdivo [Nivolumab] Rash    FamHx Family History  Problem Relation Age of Onset   Cancer Sister 38       Cancer, unsure type   Hypertension Other    Colon cancer Neg Hx    Esophageal cancer Neg Hx    Stomach cancer Neg Hx    Diabetes Neg Hx    Colon polyps Neg Hx    Rectal cancer Neg Hx     Prior to Admission medications   Medication Sig Start Date End Date Taking? Authorizing Provider  aspirin EC 81 MG tablet Take 81 mg by mouth daily. Swallow whole.   Yes [provider]  atorvastatin (LIPITOR) 40 MG tablet Take 1 tablet (40 mg total) by mouth daily. Patient taking differently: Take 20 mg by mouth at bedtime. 12/20/19  Yes [name], MD  cholecalciferol (VITAMIN D) 1000 units tablet Take 1,000 Units by mouth daily.   Yes [provider]  ferrous sulfate 325 (65 FE) MG tablet Take 1 tablet (325 mg total) by mouth 2 (two) times daily. Patient needs office visit for further refills 02/24/19  Yes Irene Shipper, MD  fluocinonide-emollient (LIDEX-E) 0.05 % cream Apply 1 application topically 2 (two) times daily. 04/18/21  Yes [name], MD  gabapentin (NEURONTIN) 100 MG capsule Take 100 mg by mouth at bedtime.   Yes [provider]  gemfibrozil (LOPID) 600 MG tablet Take 600 mg by mouth 2 (two) times daily before a meal.   Yes [provider]  indapamide (LOZOL) 1.25 MG tablet TAKE 1 TABLET BY MOUTH EVERY DAY 12/20/20  Yes [name], MD  LOKELMA 10 g PACK packet Take 10 g by mouth every Monday, Wednesday, and Friday. 05/04/21  Yes [provider]  Omega-3 Fatty Acids (FISH OIL PO) Take 1 tablet by mouth daily.   Yes [provider]  OVER THE COUNTER MEDICATION Take 1 tablet by mouth daily. Focus factor   Yes [provider]  polyvinyl alcohol (LIQUIFILM TEARS) 1.4 % ophthalmic solution Place 1 drop into both eyes as needed for dry eyes.   Yes [provider]  solifenacin (VESICARE) 5 MG tablet Take 1 tablet (5 mg total) by mouth daily. 12/28/20  Yes [name], MD  tamsulosin (FLOMAX) 0.4 MG CAPS capsule TAKE 1 CAPSULE (0.4 MG TOTAL) BY MOUTH DAILY. Patient taking differently: Take 0.4 mg by mouth daily. 01/20/14  Yes [name], MD  amLODipine (NORVASC) 5 MG tablet Take 1 tablet (5 mg total) by mouth daily. Patient not taking: Reported on 05/23/2021 05/23/20   [name], MD  Continuous Blood Gluc Receiver (FREESTYLE LIBRE 14 DAY READER) DEVI 1 Act by Does not apply route daily.  05/23/20   [name], MD  Continuous Blood Gluc Sensor (FREESTYLE LIBRE 2 SENSOR) MISC 1 Act by Does not apply route daily. 05/23/20   [name], MD  insulin glargine (LANTUS) 100 UNIT/ML injection Inject 70 Units into the skin daily as needed (for hyperglycemia). Patient not taking: Reported on 05/23/2021    [provider]  Insulin Pen Needle 32G X 6 MM MISC 1 Act by Does not apply route daily. 05/23/20   [name], MD  ONE TOUCH ULTRA TEST test strip USE 2 (TWO) TIMES DAILY. AND LANCETS 2/DAY 250.03 11/05/16   [name], MD  traMADol (ULTRAM) 50 MG tablet Take 50-100 mg by mouth every 6 (six) hours as needed for pain. 06/03/20   [provider]    Physical Exam: Vitals:   05/23/21 0503 05/23/21 0515 05/23/21 0600 05/23/21 0630  BP:  111/61 118/66 109/73  Pulse:  98 96 95  Resp:  17 16 16   Temp: (!) 96.2 F (35.7 C)     TempSrc: Rectal     SpO2:  98% 98% 100%  Weight:      Height:        General: 76 y.o. chronically ill appearing male resting in bed Eyes: PERRL, normal sclera ENMT: Nares patent w/o discharge, orophaynx clear, dentition normal, ears w/o discharge/lesions/ulcers Neck: Supple, trachea midline Cardiovascular: RRR, +S1, S2, no m/g/r, equal pulses throughout Respiratory: CTABL, no w/r/r, normal WOB GI: BS+, NDNT, no masses noted, no organomegaly noted MSK: No e/c/c Neuro: A&O x 3, no focal deficits Psyc: Appropriate interaction and affect, calm/cooperative  Labs on Admission: I have personally reviewed following labs and imaging studies  CBC: Recent Labs  Lab 05/16/21 1804 05/20/21 1525 05/23/21 0422  WBC 4.9 6.5 10.2  NEUTROABS  --  5.1 8.6*  HGB 8.7* 10.3* 11.2*  HCT 26.8* 30.8* 33.2*  MCV 94.0 91.5 92.0  PLT 255 326.0 932   Basic Metabolic Panel: Recent Labs  Lab 05/16/21 1804 05/20/21  1525 05/23/21 0422  NA 133* 133* 134*  K 5.2* 4.8 4.8  CL 115* 115* 117*  CO2 13* 11* 9*  GLUCOSE 84 95 114*  BUN 53* 57* 74*   CREATININE 4.35* 4.67* 5.80*  CALCIUM 8.8* 9.5 9.2   GFR: Estimated Creatinine Clearance: 12.1 mL/min (A) (by C-G formula based on SCr of 5.8 mg/dL (H)). Liver Function Tests: Recent Labs  Lab 05/16/21 1804 05/20/21 1525 05/23/21 0422  AST 23 21 22   ALT 27 25 30   ALKPHOS 105 113 111  BILITOT 0.5 0.2 0.4  PROT 6.2* 6.1 6.5  ALBUMIN 2.9* 3.4* 3.1*   Recent Labs  Lab 05/16/21 1804 05/23/21 0422  LIPASE 38 48   No results for input(s): AMMONIA in the last 168 hours. Coagulation Profile: Recent Labs  Lab 05/23/21 0422  INR 1.2   Cardiac Enzymes: No results for input(s): CKTOTAL, CKMB, CKMBINDEX, TROPONINI in the last 168 hours. BNP (last 3 results) No results for input(s): PROBNP in the last 8760 hours. HbA1C: No results for input(s): HGBA1C in the last 72 hours. CBG: No results for input(s): GLUCAP in the last 168 hours. Lipid Profile: No results for input(s): CHOL, HDL, LDLCALC, TRIG, CHOLHDL, LDLDIRECT in the last 72 hours. Thyroid Function Tests: No results for input(s): TSH, T4TOTAL, FREET4, T3FREE, THYROIDAB in the last 72 hours. Anemia Panel: No results for input(s): VITAMINB12, FOLATE, FERRITIN, TIBC, IRON, RETICCTPCT in the last 72 hours. Urine analysis:    Component Value Date/Time   COLORURINE YELLOW 05/16/2021 1913   APPEARANCEUR CLEAR 05/16/2021 1913   LABSPEC 1.015 05/16/2021 1913   PHURINE 5.0 05/16/2021 1913   GLUCOSEU NEGATIVE 05/16/2021 1913   GLUCOSEU 250 (A) 05/20/2020 1606   HGBUR SMALL (A) 05/16/2021 1913   BILIRUBINUR NEGATIVE 05/16/2021 1913   BILIRUBINUR neg 03/21/2012 1127   [place] 05/16/2021 1913   PROTEINUR 100 (A) 05/16/2021 1913   UROBILINOGEN 0.2 05/20/2020 1606   NITRITE NEGATIVE 05/16/2021 1913   LEUKOCYTESUR NEGATIVE 05/16/2021 1913    Radiological Exams on Admission: DG Chest Portable 1 View  Result Date: 05/23/2021 CLINICAL DATA:  Hypotension. EXAM: PORTABLE CHEST 1 VIEW COMPARISON:  05/20/2021. FINDINGS:  The heart size and mediastinal contours are within normal limits. Both lungs are clear. No acute osseous abnormality. IMPRESSION: No acute cardiopulmonary process. Electronically Signed   By: Brett Fairy M.D.   On: 05/23/2021 04:53    EKG: Independently reviewed. Sinus tach, no st elevations  Assessment/Plan Sepsis secondary to unknown source Diarrhea     - admit to inpt, progressive     - possible intra-abdominal infection; currently on broad spec abx; continue     - stool studies sent; follow Bld Cx, UA pending     - fluids, anti-emetics     - start diet as tolerated  AKI on CKD 4 Non gap metabolic acidosis     - fluids w/ bicarb; reassess this evening     - renal US  Normocytic anemia     - likely anemia of chronic disease     - he is at baseline, follow  DM2 DM neuropathy     - SSI, A1c, glucose check, DM diet     - resume neurontin as renal fxn moves back to baseline  HLD CAD     - resume statin, ASA  HTN     - hold BP meds as he came in hypotensive  BPH     - continue flomax  DVT prophylaxis: lovenox  Code  Status: FULL  Family Communication: w/ wife at bedside  Consults called: None   Status is: Inpatient  Remains inpatient appropriate because: severity of illness  Yoselin Amerman A Mckinley Olheiser DO [facility]  If 7PM-7AM, please contact night-coverage [email or web address]  05/23/2021, 7:15 AM

## 2021-05-23 NOTE — ED Triage Notes (Signed)
Pt arrives EMS from home with c/o nausea, vomiting, diarrhea, shob and generalized body aches that have developed since 1/6 encounter.

## 2021-05-23 NOTE — Progress Notes (Signed)
A consult was received from an ED physician for Vancomycin and Cefepime per pharmacy dosing.  The patient's profile has been reviewed for ht/wt/allergies/indication/available labs.    A one time order has been placed for Vancomycin 1500mg  IV and Cefepime 2gm IV.    Further antibiotics/pharmacy consults should be ordered by admitting physician if indicated.                       Thank you, Everette Rank, PharmD 05/23/2021  4:07 AM

## 2021-05-23 NOTE — ED Notes (Signed)
X-ray at bedside

## 2021-05-23 NOTE — Plan of Care (Signed)
  Problem: Education: Goal: Knowledge of General Education information will improve Description: Including pain rating scale, medication(s)/side effects and non-pharmacologic comfort measures Outcome: Progressing   Problem: Clinical Measurements: Goal: Diagnostic test results will improve Outcome: Progressing   

## 2021-05-23 NOTE — ED Notes (Signed)
Patient on the bedpan.

## 2021-05-23 NOTE — ED Notes (Signed)
Patient placed on bedpan.

## 2021-05-23 NOTE — ED Provider Notes (Signed)
[facility] DEPT Provider Note   CSN: [id number] Arrival date & time: 05/23/21  0334     History  Chief Complaint  Patient presents with   Diarrhea    Antonio Adams is a 76 y.o. male.  HPI  76 year old male with medical history significant for HLD, HTN, GERD, CKD, history of colon polyps, history of prostate cancer status post brachytherapy, depression, anxiety, history of renal cell carcinoma status post partial right nephrectomy and adrenalectomy presented to the emergency department with fatigue, nausea, vomiting, diarrhea.  The patient states that he has had diarrhea for the past 3 weeks.  He was seen on 1/6 for this and his diarrhea was thought to be due to a possible medication side effect.  On chart review, the patient had been taking Xyzal which she was advised to stop.  He was seen by his PCP on 05/20/2021 and C. difficile and GIP testing was ordered but not collected.  Since then he is felt lightheaded, generally fatigued, weak, dehydrated.  He denies any chest pain or shortness of breath.  He denies any abdominal pain.  He endorses persistent frequent episodes of watery stools daily.  He has had a couple episodes of nonbloody nonbilious vomiting.  He has had difficulty tolerating oral intake.  He denies any blood in his stools.  He denies any dark tarry stools.  Home Medications Prior to Admission medications   Medication Sig Start Date End Date Taking? Authorizing Provider  aspirin EC 81 MG tablet Take 81 mg by mouth daily. Swallow whole.   Yes [provider]  atorvastatin (LIPITOR) 40 MG tablet Take 1 tablet (40 mg total) by mouth daily. Patient taking differently: Take 20 mg by mouth at bedtime. 12/20/19  Yes [name], MD  cholecalciferol (VITAMIN D) 1000 units tablet Take 1,000 Units by mouth daily.   Yes [provider]  ferrous sulfate 325 (65 FE) MG tablet Take 1 tablet (325 mg total) by mouth 2 (two) times daily.  Patient needs office visit for further refills 02/24/19  Yes Irene Shipper, MD  fluocinonide-emollient (LIDEX-E) 0.05 % cream Apply 1 application topically 2 (two) times daily. 04/18/21  Yes [name], MD  gabapentin (NEURONTIN) 100 MG capsule Take 100 mg by mouth at bedtime.   Yes [provider]  gemfibrozil (LOPID) 600 MG tablet Take 600 mg by mouth 2 (two) times daily before a meal.   Yes [provider]  indapamide (LOZOL) 1.25 MG tablet TAKE 1 TABLET BY MOUTH EVERY DAY 12/20/20  Yes [name], MD  LOKELMA 10 g PACK packet Take 10 g by mouth every Monday, Wednesday, and Friday. 05/04/21  Yes [provider]  Omega-3 Fatty Acids (FISH OIL PO) Take 1 tablet by mouth daily.   Yes [provider]  OVER THE COUNTER MEDICATION Take 1 tablet by mouth daily. Focus factor   Yes [provider]  polyvinyl alcohol (LIQUIFILM TEARS) 1.4 % ophthalmic solution Place 1 drop into both eyes as needed for dry eyes.   Yes [provider]  solifenacin (VESICARE) 5 MG tablet Take 1 tablet (5 mg total) by mouth daily. 12/28/20  Yes [name], MD  tamsulosin (FLOMAX) 0.4 MG CAPS capsule TAKE 1 CAPSULE (0.4 MG TOTAL) BY MOUTH DAILY. Patient taking differently: Take 0.4 mg by mouth daily. 01/20/14  Yes [name], MD  amLODipine (NORVASC) 5 MG tablet Take 1 tablet (5 mg total) by mouth daily. Patient  not taking: Reported on 05/23/2021 05/23/20   [name], MD  Continuous Blood Gluc Receiver (FREESTYLE LIBRE 14 DAY READER) DEVI 1 Act by Does not apply route daily. 05/23/20   [name], MD  Continuous Blood Gluc Sensor (FREESTYLE LIBRE 2 SENSOR) MISC 1 Act by Does not apply route daily. 05/23/20   [name], MD  insulin glargine (LANTUS) 100 UNIT/ML injection Inject 70 Units into the skin daily as needed (for hyperglycemia). Patient not taking: Reported on 05/23/2021    [provider]  Insulin Pen Needle 32G X 6 MM MISC 1 Act by  Does not apply route daily. 05/23/20   [name], MD  ONE TOUCH ULTRA TEST test strip USE 2 (TWO) TIMES DAILY. AND LANCETS 2/DAY 250.03 11/05/16   [name], MD  traMADol (ULTRAM) 50 MG tablet Take 50-100 mg by mouth every 6 (six) hours as needed for pain. 06/03/20   [provider]      Allergies    Ace inhibitors and Opdivo [nivolumab]    Review of Systems   Review of Systems  Physical Exam Updated Vital Signs BP 109/73 (BP Location: Left Arm)    Pulse 95    Temp (!) 96.2 F (35.7 C) (Rectal)    Resp 16    Ht 6' (1.829 m)    Wt 84.4 kg    SpO2 100%    BMI 25.23 kg/m  Physical Exam Vitals and nursing note reviewed.  Constitutional:      General: He is not in acute distress.    Appearance: He is well-developed. He is ill-appearing.  HENT:     Head: Normocephalic and atraumatic.     Mouth/Throat:     Mouth: Mucous membranes are dry.  Eyes:     Conjunctiva/sclera: Conjunctivae normal.     Pupils: Pupils are equal, round, and reactive to light.  Cardiovascular:     Rate and Rhythm: Regular rhythm. Tachycardia present.  Pulmonary:     Effort: Pulmonary effort is normal. No respiratory distress.     Breath sounds: Normal breath sounds.  Abdominal:     General: There is no distension.     Palpations: Abdomen is soft.     Tenderness: There is no abdominal tenderness. There is no guarding.  Musculoskeletal:        General: No swelling, deformity or signs of injury.     Cervical back: Neck supple.  Skin:    General: Skin is warm and dry.     Capillary Refill: Capillary refill takes less than 2 seconds.     Findings: No lesion or rash.  Neurological:     General: No focal deficit present.     Mental Status: He is alert. Mental status is at baseline.  Psychiatric:        Mood and Affect: Mood normal.    ED Results / Procedures / Treatments   Labs (all labs ordered are listed, but only abnormal results are displayed) Labs Reviewed  COMPREHENSIVE METABOLIC  PANEL - Abnormal; Notable for the following components:      Result Value   Sodium 134 (*)    Chloride 117 (*)    CO2 9 (*)    Glucose, Bld 114 (*)    BUN 74 (*)    Creatinine, Ser 5.80 (*)    Albumin 3.1 (*)    GFR, Estimated 10 (*)    All other components within normal limits  CBC WITH DIFFERENTIAL/PLATELET - Abnormal;  Notable for the following components:   RBC 3.61 (*)    Hemoglobin 11.2 (*)    HCT 33.2 (*)    RDW 15.7 (*)    nRBC 0.3 (*)    Neutro Abs 8.6 (*)    Lymphs Abs 0.4 (*)    Monocytes Absolute 1.1 (*)    All other components within normal limits  D-DIMER, QUANTITATIVE - Abnormal; Notable for the following components:   D-Dimer, Quant 1.50 (*)    All other components within normal limits  APTT - Abnormal; Notable for the following components:   aPTT 22 (*)    All other components within normal limits  TROPONIN I (HIGH SENSITIVITY) - Abnormal; Notable for the following components:   Troponin I (High Sensitivity) 24 (*)    All other components within normal limits  TROPONIN I (HIGH SENSITIVITY) - Abnormal; Notable for the following components:   Troponin I (High Sensitivity) 38 (*)    All other components within normal limits  RESP PANEL BY RT-PCR (FLU A&B, COVID) ARPGX2  C DIFFICILE QUICK SCREEN W PCR REFLEX    GASTROINTESTINAL PANEL BY PCR, STOOL (REPLACES STOOL CULTURE)  CULTURE, BLOOD (ROUTINE X 2)  CULTURE, BLOOD (ROUTINE X 2)  URINE CULTURE  LIPASE, BLOOD  LACTIC ACID, PLASMA  PROTIME-INR  URINALYSIS, ROUTINE W REFLEX MICROSCOPIC    EKG EKG Interpretation  Date/Time:  Monday May 23 2021 04:14:41 EST Ventricular Rate:  116 PR Interval:  188 QRS Duration: 84 QT Interval:  303 QTC Calculation: 421 R Axis:   39 Text Interpretation: Sinus tachycardia Abnormal R-wave progression, early transition Borderline T abnormalities, diffuse leads Confirmed by Regan Lemming ([id number]) on 05/23/2021 4:36:42 AM  Radiology DG Chest Portable 1 View  Result Date:  05/23/2021 CLINICAL DATA:  Hypotension. EXAM: PORTABLE CHEST 1 VIEW COMPARISON:  05/20/2021. FINDINGS: The heart size and mediastinal contours are within normal limits. Both lungs are clear. No acute osseous abnormality. IMPRESSION: No acute cardiopulmonary process. Electronically Signed   By: Brett Fairy M.D.   On: 05/23/2021 04:53    Procedures .Critical Care Performed by: Regan Lemming, MD Authorized by: Regan Lemming, MD   Critical care provider statement:    Critical care time (minutes):  34   Critical care was necessary to treat or prevent imminent or life-threatening deterioration of the following conditions:  Sepsis and shock   Critical care was time spent personally by me on the following activities:  Development of treatment plan with patient or surrogate, ordering and performing treatments and interventions, ordering and review of laboratory studies, ordering and review of radiographic studies, pulse oximetry, re-evaluation of patient's condition, review of old charts, obtaining history from patient or surrogate, examination of patient and evaluation of patient's response to treatment   Care discussed with: admitting provider      Medications Ordered in ED Medications  lactated ringers infusion ( Intravenous New Bag/Given 05/23/21 0439)  ondansetron (ZOFRAN) injection 4 mg (0 mg Intravenous Hold 05/23/21 0417)  lactated ringers bolus 1,000 mL (0 mLs Intravenous Stopped 05/23/21 0514)  lactated ringers bolus 1,000 mL (0 mLs Intravenous Stopped 05/23/21 0516)  ceFEPIme (MAXIPIME) 2 g in sodium chloride 0.9 % 100 mL IVPB (0 g Intravenous Stopped 05/23/21 0439)  metroNIDAZOLE (FLAGYL) IVPB 500 mg (0 mg Intravenous Stopped 05/23/21 0527)  vancomycin (VANCOREADY) IVPB 1500 mg/300 mL (1,500 mg Intravenous New Bag/Given 05/23/21 0506)    ED Course/ Medical Decision Making/ A&P  Medical Decision Making  76 year old male with medical history significant for HLD, HTN,  GERD, CKD, history of colon polyps, history of prostate cancer status post brachytherapy, depression, anxiety, history of renal cell carcinoma status post partial right nephrectomy and adrenalectomy presented to the emergency department with fatigue, nausea, vomiting, diarrhea.  The patient states that he has had diarrhea for the past 3 weeks.  He was seen on 1/6 for this and his diarrhea was thought to be due to a possible medication side effect.  On chart review, the patient had been taking Xyzal which she was advised to stop.  He was seen by his PCP on 05/20/2021 and C. difficile and GIP testing was ordered but not collected.  Since then he is felt lightheaded, generally fatigued, weak, dehydrated.  He denies any chest pain or shortness of breath.  He denies any abdominal pain.  He endorses persistent frequent episodes of watery stools daily.  He has had a couple episodes of nonbloody nonbilious vomiting.  He has had difficulty tolerating oral intake.  He denies any blood in his stools.  He denies any dark tarry stools.  On arrival, the patient was in shock, BP 88/64, tachycardic P1 16, hypothermic T97.5, mildly tachypneic RR 22, saturating 97% on room air.  Sinus tachycardia noted on cardiac telemetry.  Concern for hypovolemic shock in the setting of a diarrheal illness versus septic shock from an infectious source such as C. difficile or other etiologies such as urosepsis or pneumonia or other intra-abdominal infection.  IV access was obtained upon patient arrival and the patient was administered 2 IV fluid boluses with subsequent improvement in his blood pressure and tachycardia.  Code sepsis was called on patient arrival.  The patient was covered with broad-spectrum antibiotics to cover vancomycin, cefepime, Flagyl due to concern for possible infectious etiology of the patient's shock on presentation.  On repeat assessment, the patient's blood pressure remained hemodynamically stable status post 2 L of  IV fluid resuscitation for volume resuscitation.  An EKG was performed revealed sinus tachycardia, ventricular rate 116, abnormal R wave progression, borderline T wave abnormalities, no ST segment changes.  Chest x-ray was performed which revealed no acute cardiac or pulmonary abnormality.  A CT abdomen pelvis was performed and imaging was visualized by myself and radiology which revealed fluid throughout the large bowel consistent with likely diarrhea with no evidence of bowel obstruction noted.  Laboratory work-up was significant for negative COVID-19 and influenza PCR testing, CBC without a leukocytosis, stable anemia to 11.2, CMP with mild hyponatremia to 134, not anion gap metabolic acidosis present with a bicarb of 9, anion gap of 8.  The patient was found to have an AKI on CKD with a creatinine of 5.8 and a BUN of 74.  His troponin initially was mildly elevated to 24 and he had an elevated D-dimer to 1.5.  Initial lactic acid was normal at 1 and a lipase was normal at 48.  C. difficile and GIP PCR testing was collected and pending.  Etiology of the patient's presentation is unclear.  Considered infectious etiology of the patient's diarrheal illness that has been ongoing for the past 3 weeks.  No recent antibiotic usage.  No history of C. difficile infection.   Given the patient's shock on presentation, significant dehydration amenable to volume resuscitation, diarrhea of unclear etiology, concern for sepsis, hospitalist medicine was consulted for admission.  The patient was hemodynamically stable and appropriate for floor admission at the time of consultation.  I  discussed the patient's ED course and care with the patient's wife who was present bedside.   Final Clinical Impression(s) / ED Diagnoses Final diagnoses:  Diarrhea, unspecified type  Dehydration  Shock ([place])  Hypovolemia  SIRS (systemic inflammatory response syndrome) ([place])    Rx / DC Orders ED Discharge Orders     None          Regan Lemming, MD 05/23/21 [phone]

## 2021-05-24 DIAGNOSIS — N400 Enlarged prostate without lower urinary tract symptoms: Secondary | ICD-10-CM | POA: Diagnosis present

## 2021-05-24 DIAGNOSIS — N179 Acute kidney failure, unspecified: Secondary | ICD-10-CM | POA: Diagnosis present

## 2021-05-24 DIAGNOSIS — E872 Acidosis, unspecified: Secondary | ICD-10-CM | POA: Diagnosis present

## 2021-05-24 DIAGNOSIS — N185 Chronic kidney disease, stage 5: Secondary | ICD-10-CM | POA: Diagnosis not present

## 2021-05-24 DIAGNOSIS — E860 Dehydration: Secondary | ICD-10-CM | POA: Diagnosis present

## 2021-05-24 LAB — PROCALCITONIN: Procalcitonin: 0.63 ng/mL

## 2021-05-24 LAB — COMPREHENSIVE METABOLIC PANEL
ALT: 25 U/L (ref 0–44)
AST: 18 U/L (ref 15–41)
Albumin: 2.7 g/dL — ABNORMAL LOW (ref 3.5–5.0)
Alkaline Phosphatase: 98 U/L (ref 38–126)
Anion gap: 11 (ref 5–15)
BUN: 62 mg/dL — ABNORMAL HIGH (ref 8–23)
CO2: 9 mmol/L — ABNORMAL LOW (ref 22–32)
Calcium: 9 mg/dL (ref 8.9–10.3)
Chloride: 120 mmol/L — ABNORMAL HIGH (ref 98–111)
Creatinine, Ser: 5.41 mg/dL — ABNORMAL HIGH (ref 0.61–1.24)
GFR, Estimated: 10 mL/min — ABNORMAL LOW (ref >60)
Glucose, Bld: 87 mg/dL (ref 70–99)
Potassium: 4.4 mmol/L (ref 3.5–5.1)
Sodium: 140 mmol/L (ref 135–145)
Total Bilirubin: 0.4 mg/dL (ref 0.3–1.2)
Total Protein: 5.8 g/dL — ABNORMAL LOW (ref 6.5–8.1)

## 2021-05-24 LAB — CBC
HCT: 31.3 % — ABNORMAL LOW (ref 39.0–52.0)
Hemoglobin: 10.3 g/dL — ABNORMAL LOW (ref 13.0–17.0)
MCH: 30.8 pg (ref 26.0–34.0)
MCHC: 32.9 g/dL (ref 30.0–36.0)
MCV: 93.7 fL (ref 80.0–100.0)
Platelets: 299 10*3/uL (ref 150–400)
RBC: 3.34 MIL/uL — ABNORMAL LOW (ref 4.22–5.81)
RDW: 15.9 % — ABNORMAL HIGH (ref 11.5–15.5)
WBC: 7 10*3/uL (ref 4.0–10.5)
nRBC: 0.3 % — ABNORMAL HIGH (ref 0.0–0.2)

## 2021-05-24 LAB — URINE CULTURE: Culture: NO GROWTH

## 2021-05-24 LAB — CORTISOL-AM, BLOOD: Cortisol - AM: 17.9 ug/dL (ref 6.7–22.6)

## 2021-05-24 LAB — GLUCOSE, CAPILLARY
Glucose-Capillary: 69 mg/dL — ABNORMAL LOW (ref 70–99)
Glucose-Capillary: 80 mg/dL (ref 70–99)
Glucose-Capillary: 83 mg/dL (ref 70–99)
Glucose-Capillary: 90 mg/dL (ref 70–99)
Glucose-Capillary: 91 mg/dL (ref 70–99)
Glucose-Capillary: 77 mg/dL (ref 70–99)

## 2021-05-24 LAB — PROTIME-INR
INR: 1.2 (ref 0.8–1.2)
Prothrombin Time: 15.1 seconds (ref 11.4–15.2)

## 2021-05-24 MED ORDER — PROSOURCE PLUS PO LIQD
30.0000 mL | Freq: Two times a day (BID) | ORAL | Status: DC
  Administered 2021-05-24 – 2021-05-28 (×4): 30 mL via ORAL
  Filled 2021-05-24 (×6): qty 30

## 2021-05-24 MED ORDER — BOOST / RESOURCE BREEZE PO LIQD CUSTOM
1.0000 | Freq: Two times a day (BID) | ORAL | Status: DC
  Administered 2021-05-24 – 2021-05-29 (×5): 1 via ORAL

## 2021-05-24 MED ORDER — SODIUM BICARBONATE 8.4 % IV SOLN
INTRAVENOUS | Status: DC
  Filled 2021-05-24 (×2): qty 1000
  Filled 2021-05-24: qty 150
  Filled 2021-05-24: qty 1000
  Filled 2021-05-24 (×2): qty 150

## 2021-05-24 MED ORDER — ORAL CARE MOUTH RINSE
15.0000 mL | Freq: Two times a day (BID) | OROMUCOSAL | Status: DC
  Administered 2021-05-24 – 2021-05-29 (×10): 15 mL via OROMUCOSAL

## 2021-05-24 MED ORDER — ADULT MULTIVITAMIN W/MINERALS CH
1.0000 | ORAL_TABLET | Freq: Every day | ORAL | Status: DC
  Administered 2021-05-24 – 2021-05-29 (×6): 1 via ORAL
  Filled 2021-05-24 (×6): qty 1

## 2021-05-24 MED ORDER — LOPERAMIDE HCL 2 MG PO CAPS
2.0000 mg | ORAL_CAPSULE | Freq: Three times a day (TID) | ORAL | Status: DC
  Administered 2021-05-24 – 2021-05-27 (×11): 2 mg via ORAL
  Filled 2021-05-24 (×12): qty 1

## 2021-05-24 NOTE — TOC Initial Note (Signed)
Transition of Care [facility]) - Initial/Assessment Note    Patient Details  Name: Antonio Adams MRN: [id number] Date of Birth: January 05, 1946  [facility] of [facility]) CM/SW Contact:    Leeroy Cha, RN Phone Number: 05/24/2021, 8:09 AM  Clinical Narrative:                  Transition of Care [facility]) Screening Note   Patient Details  Name: Antonio Adams Date of Birth: Mar 09, 1946   Transition of Care [facility]) CM/SW Contact:    Leeroy Cha, RN Phone Number: 05/24/2021, 8:09 AM    Transition of Care Department [facility]) has reviewed patient and no TOC needs have been identified at this time. We will continue to monitor patient advancement through interdisciplinary progression rounds. If new patient transition needs arise, please place a TOC consult.    Expected Discharge Plan: Home/Self Care Barriers to Discharge: Continued Medical Work up   Patient Goals and CMS Choice Patient states their goals for this hospitalization and ongoing recovery are:: to go home CMS Medicare.gov Compare Post Acute Care list provided to:: Legal Guardian    Expected Discharge Plan and Services Expected Discharge Plan: Home/Self Care   Discharge Planning Services: CM Consult   Living arrangements for the past 2 months: Single Family Home                                      Prior Living Arrangements/Services Living arrangements for the past 2 months: Single Family Home Lives with:: Significant Other Patient language and need for interpreter reviewed:: Yes Do you feel safe going back to the place where you live?: Yes            Criminal Activity/Legal Involvement Pertinent to Current Situation/Hospitalization: No - Comment as needed  Activities of Daily Living Home Assistive Devices/Equipment: Cane (specify quad or straight) ADL Screening (condition at time of admission) Patient's cognitive ability adequate to safely complete daily activities?: Yes Is the patient  deaf or have difficulty hearing?: No Does the patient have difficulty seeing, even when wearing glasses/contacts?: No Does the patient have difficulty concentrating, remembering, or making decisions?: No Patient able to express need for assistance with ADLs?: Yes Does the patient have difficulty dressing or bathing?: No Independently performs ADLs?: Yes (appropriate for developmental age) Does the patient have difficulty walking or climbing stairs?: No Weakness of Legs: Both Weakness of Arms/Hands: None  Permission Sought/Granted                  Emotional Assessment Appearance:: Appears stated age Attitude/Demeanor/Rapport: Engaged Affect (typically observed): Calm Orientation: : Oriented to Place, Oriented to Self, Oriented to  Time, Oriented to Situation Alcohol / Substance Use: Not Applicable Psych Involvement: No (comment)  Admission diagnosis:  Dehydration [E86.0] Hypovolemia [E86.1] Shock ([place]) [R57.9] SIRS (systemic inflammatory response syndrome) ([place]) [R65.10] Sepsis ([place]) [A41.9] Diarrhea, unspecified type [R19.7] Patient Active Problem List   Diagnosis Date Noted   Sepsis ([place]) 05/23/2021   Contact dermatitis and other eczema 05/20/2021   Dental caries on smooth surface penetrating into pulp 05/20/2021   Deposits (accretions) on teeth 05/20/2021   Diabetic renal disease ([place]) 05/20/2021   Dry eye syndrome of bilateral lacrimal glands 05/20/2021   Dystrophia unguium 05/20/2021   History of colonic polyps 05/20/2021   Leucopenia 05/20/2021   Observation and evaluation for other specified suspected conditions 05/20/2021   Osteoarthrosis 05/20/2021  Other acquired deformity of toe 05/20/2021   Other specified disorder of rectum and anus 05/20/2021   Problem related to unspecified psychosocial circumstances 05/20/2021   Senile nuclear sclerosis 05/20/2021   Vitamin D deficiency 05/20/2021   Vitreous degeneration, unspecified eye 05/20/2021   Intrinsic eczema  04/18/2021   Allergic drug reaction 04/18/2021   Atherosclerosis of aorta ([place]) 12/30/2020   Vertigo 09/23/2020   Mixed conductive and sensorineural hearing loss of left ear with restricted hearing of right ear 09/01/2020   Person encountering health services to consult on behalf of another person 05/20/2020   Tinnitus of both ears 05/20/2020   Metastatic renal cell carcinoma ([place]) 02/26/2020   Diabetic polyneuropathy associated with type 2 diabetes mellitus ([place]) 12/18/2019   CKD (chronic kidney disease) stage 3, GFR 30-59 ml/min ([place]) 10/08/2018   Murmur, cardiac 10/08/2018   DDD (degenerative disc disease), cervical 10/08/2018   OAB (overactive bladder) 04/04/2018   Seasonal allergic rhinitis due to pollen 11/01/2016   Microalbuminuria due to type 2 diabetes mellitus ([place]) 07/27/2016   Varicose veins of both lower extremities 08/06/2015   Varicose veins of lower extremities with ulcer ([place]) 06/16/2015   Internal hemorrhoid, bleeding 08/03/2014   Post traumatic stress disorder (PTSD) 01/29/2014   Chronic gingivitis, plaque induced 06/04/2012   Spinal stenosis of lumbar region with radiculopathy 10/30/2011    Class: Chronic   Obstructive sleep apnea 11/24/2010   Obesity 11/24/2010   Routine general medical examination at a health care facility 11/24/2010   ADENOCARCINOMA, PROSTATE, GLEASON GRADE 6 12/21/2009   Type 2 diabetes mellitus with diabetic cataract ([place]) 03/16/2007   Other and unspecified hyperlipidemia 03/16/2007   Essential hypertension 03/16/2007   GERD 03/16/2007   PCP:  [name], MD Pharmacy:   [id number] - [place], [place] - [address] [place] [address] Phone: [phone] Fax: [place], [place] - [place] [address] [place] [address] Phone: [phone] Fax: [phone]     Social Determinants of Health (SDOH)  Interventions    Readmission Risk Interventions No flowsheet data found.

## 2021-05-24 NOTE — Plan of Care (Signed)
°  Problem: Education: Goal: Knowledge of General Education information will improve Description: Including pain rating scale, medication(s)/side effects and non-pharmacologic comfort measures Outcome: Progressing   Problem: Activity: Goal: Risk for activity intolerance will decrease Outcome: Progressing   Problem: Coping: Goal: Level of anxiety will decrease Outcome: Progressing   Problem: Elimination: Goal: Will not experience complications related to urinary retention Outcome: Progressing   Problem: Pain Managment: Goal: General experience of comfort will improve Outcome: Progressing   Problem: Safety: Goal: Ability to remain free from injury will improve Outcome: Progressing   Problem: Skin Integrity: Goal: Risk for impaired skin integrity will decrease Outcome: Progressing   Problem: Respiratory: Goal: Ability to maintain adequate ventilation will improve Outcome: Progressing   

## 2021-05-24 NOTE — Progress Notes (Signed)
PROGRESS NOTE    Antonio Adams  ZOX:[id number] DOB: 1945-11-17 DOA: 05/23/2021 PCP: [name], MD    Brief Narrative:  76 year old male with a history of renal cell carcinoma, chronic kidney disease stage V, diabetes, hypertension, admitted to the hospital with 3 to 4-week history of persistent vomiting and diarrhea.  He is noted to be significantly dehydrated and acute kidney injury metabolic acidosis.  Etiology of diarrhea is not entirely clear.  Currently receiving supportive treatment with IV fluids.  Gastroenterology consulted.   Assessment & Plan:   Active Problems:   Type 2 diabetes mellitus with diabetic cataract (HCC)   Essential hypertension   Metastatic renal cell carcinoma (HCC)   Metabolic acidosis   Dehydration   CKD (chronic kidney disease) stage 5, GFR less than 15 ml/min (HCC)   AKI (acute kidney injury) (HCC)   BPH (benign prostatic hyperplasia)   Persistent diarrhea, nausea, vomiting -Etiology is unclear -Stool studies including C. difficile and GI pathogen panel unremarkable -He was previously on treatment for malignancy, but has not had any treatment in approximately 2 months -No other new medications -Symptoms have been present for 3 to 4 weeks -CT scan did not show any evidence of colitis, although did show liquid stool in colon -We will consult GI for further work-up -Start on scheduled Imodium for symptomatic relief  Acute kidney injury on chronic kidney disease stage V -Baseline creatinine appears to be around 4.5 -Admission creatinine 5.8 -Currently, creatinine is 5.4 -No evidence of hydronephrosis on imaging -Continue IV hydration  Metabolic acidosis -Related to GI losses with persistent diarrhea and vomiting -Change IV fluids to bicarbonate infusion in dextrose -He does not appear septic or toxic -Continue to monitor  Hypotension -Suspect is related to volume depletion in the setting of dehydration -Sepsis has been ruled  out -Overall blood pressures are improving with IV hydration -We will hold off on further antibiotics  History of hypertension -Holding amlodipine in light of low blood pressures on admission  Diabetes -Having episodes of hypoglycemia -Home medications -Continue sliding scale  Metastatic renal cancer -Continue follow-up with Dr. Alen Blew  Hyperlipidemia -Continue statin  BPH -Continue on Flomax  DVT prophylaxis: heparin injection 5,000 Units Start: 05/23/21 2200  Code Status: Full code Family Communication: Discussed with his fiance at bedside Disposition Plan: Status is: Inpatient  Remains inpatient appropriate because: Continued work-up/management of acute kidney injury, metabolic acidosis and persistent diarrhea.         Consultants:  Gastroenterology  Procedures:    Antimicrobials:      Subjective: Reports continued loose stool.  His p.o. intake has been poor.  He is dizzy on standing.  Objective: Vitals:   05/24/21 0430 05/24/21 1317 05/24/21 1331 05/24/21 2124  BP: 108/65 94/64 (!) 96/52 108/62  Pulse: 100 92  99  Resp: 16 16  16   Temp: 98.6 F (37 C) 97.7 F (36.5 C)  98.5 F (36.9 C)  TempSrc: Oral Oral  Oral  SpO2: 99% 100%  100%  Weight:      Height:        Intake/Output Summary (Last 24 hours) at 05/24/2021 2210 Last data filed at 05/24/2021 1800 Gross per 24 hour  Intake 3007.42 ml  Output 601 ml  Net 2406.42 ml   Filed Weights   05/23/21 0456  Weight: 84.4 kg    Examination:  General exam: Appears calm and comfortable  Respiratory system: Clear to auscultation. Respiratory effort normal. Cardiovascular system: S1 & S2 heard, RRR. No JVD,  murmurs, rubs, gallops or clicks. No pedal edema. Gastrointestinal system: Abdomen is nondistended, soft and nontender. No organomegaly or masses felt. Normal bowel sounds heard. Central nervous system: Alert and oriented. No focal neurological deficits. Extremities: Symmetric 5 x 5  power. Skin: No rashes, lesions or ulcers Psychiatry: Judgement and insight appear normal. Mood & affect appropriate.     Data Reviewed: I have personally reviewed following labs and imaging studies  CBC: Recent Labs  Lab 05/20/21 1525 05/23/21 0422 05/24/21 0341  WBC 6.5 10.2 7.0  NEUTROABS 5.1 8.6*  --   HGB 10.3* 11.2* 10.3*  HCT 30.8* 33.2* 31.3*  MCV 91.5 92.0 93.7  PLT 326.0 338 665   Basic Metabolic Panel: Recent Labs  Lab 05/20/21 1525 05/23/21 0422 05/23/21 1636 05/24/21 0341  NA 133* 134* 140 140  K 4.8 4.8 4.7 4.4  CL 115* 117* 119* 120*  CO2 11* 9* 11* 9*  GLUCOSE 95 114* 94 87  BUN 57* 74* 72* 62*  CREATININE 4.67* 5.80* 5.48* 5.41*  CALCIUM 9.5 9.2 8.8* 9.0  PHOS  --   --  6.6*  --    GFR: Estimated Creatinine Clearance: 12.9 mL/min (A) (by C-G formula based on SCr of 5.41 mg/dL (H)). Liver Function Tests: Recent Labs  Lab 05/20/21 1525 05/23/21 0422 05/23/21 1636 05/24/21 0341  AST 21 22  --  18  ALT 25 30  --  25  ALKPHOS 113 111  --  98  BILITOT 0.2 0.4  --  0.4  PROT 6.1 6.5  --  5.8*  ALBUMIN 3.4* 3.1* 2.7* 2.7*   Recent Labs  Lab 05/23/21 0422  LIPASE 48   No results for input(s): AMMONIA in the last 168 hours. Coagulation Profile: Recent Labs  Lab 05/23/21 0422 05/24/21 0341  INR 1.2 1.2   Cardiac Enzymes: No results for input(s): CKTOTAL, CKMB, CKMBINDEX, TROPONINI in the last 168 hours. BNP (last 3 results) No results for input(s): PROBNP in the last 8760 hours. HbA1C: Recent Labs    05/23/21 1844  HGBA1C 5.8*   CBG: Recent Labs  Lab 05/24/21 0519 05/24/21 0738 05/24/21 1144 05/24/21 1637 05/24/21 2104  GLUCAP 80 83 90 91 77   Lipid Profile: No results for input(s): CHOL, HDL, LDLCALC, TRIG, CHOLHDL, LDLDIRECT in the last 72 hours. Thyroid Function Tests: No results for input(s): TSH, T4TOTAL, FREET4, T3FREE, THYROIDAB in the last 72 hours. Anemia Panel: No results for input(s): VITAMINB12, FOLATE,  FERRITIN, TIBC, IRON, RETICCTPCT in the last 72 hours. Sepsis Labs: Recent Labs  Lab 05/23/21 0422 05/24/21 0341  PROCALCITON  --  0.63  LATICACIDVEN 1.0  --     Recent Results (from the past 240 hour(s))  Resp Panel by RT-PCR (Flu A&B, Covid) Nasopharyngeal Swab     Status: None   Collection Time: 05/23/21  3:59 AM   Specimen: Nasopharyngeal Swab; Nasopharyngeal(NP) swabs in vial transport medium  Result Value Ref Range Status   SARS Coronavirus 2 by RT PCR NEGATIVE NEGATIVE Final    Comment: (NOTE) SARS-CoV-2 target nucleic acids are NOT DETECTED.  The SARS-CoV-2 RNA is generally detectable in upper respiratory specimens during the acute phase of infection. The lowest concentration of SARS-CoV-2 viral copies this assay can detect is 138 copies/mL. A negative result does not preclude SARS-Cov-2 infection and should not be used as the sole basis for treatment or other patient management decisions. A negative result may occur with  improper specimen collection/handling, submission of specimen other than nasopharyngeal swab, presence  of viral mutation(s) within the areas targeted by this assay, and inadequate number of viral copies(<138 copies/mL). A negative result must be combined with clinical observations, patient history, and epidemiological information. The expected result is Negative.  Fact Sheet for Patients:  EntrepreneurPulse.com.au  Fact Sheet for Healthcare Providers:  IncredibleEmployment.be  This test is no t yet approved or cleared by the [place] FDA and  has been authorized for detection and/or diagnosis of SARS-CoV-2 by FDA under an Emergency Use Authorization (EUA). This EUA will remain  in effect (meaning this test can be used) for the duration of the COVID-19 declaration under Section 564(b)(1) of the Act, 21 U.S.C.section 360bbb-3(b)(1), unless the authorization is terminated  or revoked sooner.        Influenza A by PCR NEGATIVE NEGATIVE Final   Influenza B by PCR NEGATIVE NEGATIVE Final    Comment: (NOTE) The Xpert Xpress SARS-CoV-2/FLU/RSV plus assay is intended as an aid in the diagnosis of influenza from Nasopharyngeal swab specimens and should not be used as a sole basis for treatment. Nasal washings and aspirates are unacceptable for Xpert Xpress SARS-CoV-2/FLU/RSV testing.  Fact Sheet for Patients: EntrepreneurPulse.com.au  Fact Sheet for Healthcare Providers: IncredibleEmployment.be  This test is not yet approved or cleared by the [place] FDA and has been authorized for detection and/or diagnosis of SARS-CoV-2 by FDA under an Emergency Use Authorization (EUA). This EUA will remain in effect (meaning this test can be used) for the duration of the COVID-19 declaration under Section 564(b)(1) of the Act, 21 U.S.C. section 360bbb-3(b)(1), unless the authorization is terminated or revoked.  Performed at [facility], [place] [address] [place], [place] [address]   Blood culture (routine x 2)     Status: None (Preliminary result)   Collection Time: 05/23/21  4:22 AM   Specimen: BLOOD  Result Value Ref Range Status   Specimen Description   Final    BLOOD BLOOD RIGHT FOREARM Performed at [facility] [address] [place], [place] [address]    Special Requests   Final    BOTTLES DRAWN AEROBIC ONLY Blood Culture adequate volume Performed at [facility] [address] [place], [place] [address]    Culture   Final    NO GROWTH 1 DAY Performed at [facility], [place] [address], [place], [place] [address]    Report Status PENDING  Incomplete  Blood culture (routine x 2)     Status: None (Preliminary result)   Collection Time: 05/23/21  4:42 AM   Specimen: BLOOD LEFT HAND  Result Value Ref Range Status   Specimen Description   Final    BLOOD LEFT HAND Performed at  [facility] [address], [place], [place] [address]    Special Requests   Final    BOTTLES DRAWN AEROBIC AND ANAEROBIC Blood Culture results may not be optimal due to an inadequate volume of blood received in culture bottles Performed at [facility] [address], [place], [place] [address]    Culture   Final    NO GROWTH 1 DAY Performed at [facility], [place] [address], [place], [place] [address]    Report Status PENDING  Incomplete  C Difficile Quick Screen w PCR reflex     Status: None   Collection Time: 05/23/21  5:02 AM   Specimen: STOOL  Result Value Ref Range Status   C Diff antigen NEGATIVE NEGATIVE Final  C Diff toxin NEGATIVE NEGATIVE Final   C Diff interpretation No C. difficile detected.  Final    Comment: Performed at [facility], [place] [address], [place], [place] [address]  Gastrointestinal Panel by PCR , Stool     Status: None   Collection Time: 05/23/21  5:02 AM   Specimen: STOOL  Result Value Ref Range Status   Campylobacter species NOT DETECTED NOT DETECTED Final   Plesimonas shigelloides NOT DETECTED NOT DETECTED Final   Salmonella species NOT DETECTED NOT DETECTED Final   Yersinia enterocolitica NOT DETECTED NOT DETECTED Final   Vibrio species NOT DETECTED NOT DETECTED Final   Vibrio cholerae NOT DETECTED NOT DETECTED Final   Enteroaggregative E coli (EAEC) NOT DETECTED NOT DETECTED Final   Enteropathogenic E coli (EPEC) NOT DETECTED NOT DETECTED Final   Enterotoxigenic E coli (ETEC) NOT DETECTED NOT DETECTED Final   Shiga like toxin producing E coli (STEC) NOT DETECTED NOT DETECTED Final   Shigella/Enteroinvasive E coli (EIEC) NOT DETECTED NOT DETECTED Final   Cryptosporidium NOT DETECTED NOT DETECTED Final   Cyclospora cayetanensis NOT DETECTED NOT DETECTED Final   Entamoeba histolytica NOT DETECTED NOT DETECTED Final   Giardia lamblia NOT DETECTED NOT DETECTED Final   Adenovirus F40/41  NOT DETECTED NOT DETECTED Final   Astrovirus NOT DETECTED NOT DETECTED Final   Norovirus GI/GII NOT DETECTED NOT DETECTED Final   Rotavirus A NOT DETECTED NOT DETECTED Final   Sapovirus (I, II, IV, and V) NOT DETECTED NOT DETECTED Final    Comment: Performed at [facility], [address], [place], [place] [address]  Urine Culture     Status: None   Collection Time: 05/23/21  9:02 AM   Specimen: Urine, Clean Catch  Result Value Ref Range Status   Specimen Description   Final    URINE, CLEAN CATCH Performed at [facility], [place] [address] [place], [place] [address]    Special Requests   Final    NONE Performed at [facility], [place] [address] [place], [place] [address]    Culture   Final    NO GROWTH Performed at [facility], [place] [address] [place], [place] [address]    Report Status 05/24/2021 FINAL  Final         Radiology Studies: CT ABDOMEN PELVIS WO CONTRAST  Result Date: 05/23/2021 CLINICAL DATA:  76 year old male with nausea vomiting and diarrhea. History of recurrent kidney cancer status post surgery and ablation of right liver metastasis. EXAM: CT ABDOMEN AND PELVIS WITHOUT CONTRAST TECHNIQUE: Multidetector CT imaging of the abdomen and pelvis was performed following the standard protocol without IV contrast. COMPARISON:  Noncontrast restaging CT Chest, Abdomen, and Pelvis 05/06/2021. FINDINGS: Lower chest: Mildly improved lung volumes and ventilation. Mild lung base atelectasis and borderline to mild bronchiectasis. Calcified coronary artery atherosclerosis and/or stents. No pericardial or pleural effusion. Hepatobiliary: Stable noncontrast post treatment appearance of the liver. Gallbladder diminutive or absent. Pancreas: Negative. Spleen: Diminutive, negative. Adrenals/Urinary Tract: Unchanged noncontrast appearance of the right renal fossa since last month including 2.7 cm soft tissue nodule arising from or adjacent  to the right adrenal gland on series 2, image 26. Architectural distortion and more since pouch, stable surgical clips and partial right nephrectomy appearance. Contralateral left adrenal gland and left kidney appears stable and negative. No acute hydronephrosis or hydroureter. Diminutive, unremarkable bladder. Stomach/Bowel: Fluid mildly distending the large bowel from the splenic flexure distally. Similar fluid in the ascending  colon. Redundant large bowel. Normal appendix series 2, image 65. No discrete large bowel inflammation. Mild small bowel fluid. No dilated small bowel loops. Stomach and duodenum appear stable and within normal limits. No free air, free fluid. Vascular/Lymphatic: Mildly tortuous aorta. Mild Aortoiliac calcified atherosclerosis. No lymphadenopathy identified. Reproductive: Prostate brachytherapy. Other: No pelvic free fluid. Musculoskeletal: Stable visualized osseous structures. Somewhat heterogeneous bone mineralization but no destructive osseous lesion identified. IMPRESSION: 1. Fluid throughout the large bowel compatible with diarrhea. Normal appendix, and no discrete bowel inflammation or evidence of bowel obstruction. 2. Otherwise stable noncontrast CT appearance of the abdomen and pelvis since the restaging CTs last month, including the abnormal right renal space (please see that report). 3. Aortic Atherosclerosis (ICD10-I70.0). Electronically Signed   By: [name] M.D.   On: 05/23/2021 06:12   DG Chest Portable 1 View  Result Date: 05/23/2021 CLINICAL DATA:  Hypotension. EXAM: PORTABLE CHEST 1 VIEW COMPARISON:  05/20/2021. FINDINGS: The heart size and mediastinal contours are within normal limits. Both lungs are clear. No acute osseous abnormality. IMPRESSION: No acute cardiopulmonary process. Electronically Signed   By: Brett Fairy M.D.   On: 05/23/2021 04:53        Scheduled Meds:  (feeding supplement) PROSource Plus  30 mL Oral BID BM   aspirin EC  81 mg Oral Daily    atorvastatin  20 mg Oral QHS   darifenacin  7.5 mg Oral Daily   feeding supplement  1 Container Oral BID BM   heparin  5,000 Units Subcutaneous Q8H   insulin aspart  0-6 Units Subcutaneous TID WC   loperamide  2 mg Oral TID   mouth rinse  15 mL Mouth Rinse BID   multivitamin with minerals  1 tablet Oral Daily   ondansetron (ZOFRAN) IV  4 mg Intravenous Once   tamsulosin  0.4 mg Oral Daily   Continuous Infusions:  sodium bicarbonate 150 mEq in D5W infusion 125 mL/hr at 05/24/21 2146     LOS: 1 day    Time spent: 35 minutes    Kathie Dike, MD [facility]   If 7PM-7AM, please contact night-coverage [email or web address]  05/24/2021, 10:10 PM

## 2021-05-24 NOTE — Progress Notes (Signed)
Notified on call provider again about patient's CBG. Patient's CBG at 0427 was 69. On call provider put an order to change the rate of the D5 1/2 NS to 50 mL/hr. Gave patient some more orange juice. Rechecked patient's CBG after the orange juice and it was 80.

## 2021-05-24 NOTE — Progress Notes (Signed)
Patient has had 3 medium to large, loose, runny bowel movements throughout the nightshift.

## 2021-05-24 NOTE — Progress Notes (Signed)
Initial Nutrition Assessment  DOCUMENTATION CODES:   Not applicable  INTERVENTION:  - will liberalize diet from Carb Modified to Regular (cleared by MD). - will order Boost Breeze BID, each supplement provides 250 kcal and 9 grams of protein. - will order 30 ml Prosource Plus BID, each supplement provides 100 kcal and 15 grams protein.  - will order 1 tablet multivitamin with minerals/day. - complete NFPE when feasible.   NUTRITION DIAGNOSIS:   Increased nutrient needs related to acute illness, cancer and cancer related treatments as evidenced by estimated needs.  GOAL:   Patient will meet greater than or equal to 90% of their needs  MONITOR:   PO intake, Supplement acceptance, Labs, Weight trends  REASON FOR ASSESSMENT:   Malnutrition Screening Tool  ASSESSMENT:   76 y.o. male with medical history of renal cell carcinoma, stage 4 CKD, HTN, and type 2 DM. He presented to the ED due to diarrhea x3 weeks that began after he took xyzal. He was also experiencing N/V, lightheadedness, and increased weakness.  Unable to see patient at time of attempted visit. Diet advanced from NPO to Carb Modified yesterday at 0858 and no meal completion percentages documented since that time.   He has not been seen by a [place] RD at any time in the past.   Weight yesterday was 186 lb and weight on 04/20/21 was 204 lb. This indicates 18 lb weight loss (8.8% body weight) in the past 1 month; significant for time frame. No information documented in the edema section of flow sheet.   He is noted to be +2.7 L since admission.  Labs reviewed; HgbA1c on 1/9: 5.8%, CBGs: 69-90 mg/dl, Cl: 120 mmol/l, BUN: 62 mg/dl, creatinine: 5.41 mg/dl, GFR: 10 ml/min.   Medications reviewed; sliding scale novolog.  IVF; D5-150 mEq IV KCl @ 125 ml/hr (510 kcal/24 hrs; 3L/24 hrs)--started 1/10 at 0800    NUTRITION - FOCUSED PHYSICAL EXAM:  Unable to complete at this time.   Diet Order:   Diet Order              Diet regular Room service appropriate? Yes; Fluid consistency: Thin  Diet effective now                   EDUCATION NEEDS:   No education needs have been identified at this time  Skin:  Skin Assessment: Reviewed RN Assessment  Last BM:  1/10 (type 7 x1, large amount; type 6 x1, large amount)  Height:   Ht Readings from Last 1 Encounters:  05/23/21 6' (1.829 m)    Weight:   Wt Readings from Last 1 Encounters:  05/23/21 84.4 kg     Estimated Nutritional Needs:  Kcal:  2115-2350 kcal Protein:  105-120 grams Fluid:  >/= 2.5 L/day      Jarome Matin, MS, RD, LDN Inpatient Clinical Dietitian RD pager # available in [place]  After hours/weekend pager # available in [facility]

## 2021-05-24 NOTE — Progress Notes (Signed)
Patient's CBG at 1953 was 64. Implemented hypoglycemia protocol. Patient was given orange juice. CBG rechecked at 2018 was 60. Implemented hypoglycemia protocol again. Patient was given some more orange juice. CBG rechecked at 2105 was 73. Notified on call provider about patient's CBGs. On call provider gave new orders to start patient on D5 1/2NS at 30 mL/hr continuous. Patient's CBG was rechecked again at 2301 and was 64. The hypoglycemia protocol was once again implemented. Patient was given some more orange juice. Patient's CBG was rechecked at 2354 and was 77. On call provider also gave an order to check patient's CBG every 4 hours.

## 2021-05-25 DIAGNOSIS — K521 Toxic gastroenteritis and colitis: Secondary | ICD-10-CM | POA: Diagnosis not present

## 2021-05-25 DIAGNOSIS — N179 Acute kidney failure, unspecified: Secondary | ICD-10-CM | POA: Diagnosis not present

## 2021-05-25 LAB — GLUCOSE, CAPILLARY
Glucose-Capillary: 100 mg/dL — ABNORMAL HIGH (ref 70–99)
Glucose-Capillary: 116 mg/dL — ABNORMAL HIGH (ref 70–99)
Glucose-Capillary: 120 mg/dL — ABNORMAL HIGH (ref 70–99)
Glucose-Capillary: 87 mg/dL (ref 70–99)
Glucose-Capillary: 90 mg/dL (ref 70–99)
Glucose-Capillary: 91 mg/dL (ref 70–99)

## 2021-05-25 MED ORDER — SODIUM BICARBONATE 650 MG PO TABS
1300.0000 mg | ORAL_TABLET | Freq: Four times a day (QID) | ORAL | Status: DC
  Administered 2021-05-25 (×3): 1300 mg via ORAL
  Filled 2021-05-25 (×3): qty 2

## 2021-05-25 NOTE — Progress Notes (Signed)
PROGRESS NOTE    Antonio Adams  QZR:[id number] DOB: 28-Oct-1945 DOA: 05/23/2021 PCP: [name], MD    Brief Narrative:  75 year old male with a history of renal cell carcinoma, chronic kidney disease stage V, diabetes, hypertension, admitted to the hospital with 3 to 4-week history of persistent vomiting and diarrhea.  He is noted to be significantly dehydrated and acute kidney injury metabolic acidosis.  Etiology of diarrhea is not entirely clear.  Currently receiving supportive treatment with IV fluids.  Gastroenterology consulted.  05/25/2021: Patient was seen and examined at his bedside.  Endorses nausea with no vomiting.  Had a bowel movement today.   Assessment & Plan:   Active Problems:   Type 2 diabetes mellitus with diabetic cataract (HCC)   Essential hypertension   Metastatic renal cell carcinoma (HCC)   Metabolic acidosis   Dehydration   CKD (chronic kidney disease) stage 5, GFR less than 15 ml/min (HCC)   AKI (acute kidney injury) (HCC)   BPH (benign prostatic hyperplasia)   Persistent diarrhea, nausea, vomiting -Etiology is unclear -Stool studies including C. difficile and GI pathogen panel unremarkable -He was previously on treatment for malignancy, but has not had any treatment in approximately 2 months -No other new medications -Symptoms have been present for 3 to 4 weeks -CT scan did not show any evidence of colitis, although did show liquid stool in colon -We will consult GI for further work-up -Start on scheduled Imodium for symptomatic relief  Acute kidney injury on chronic kidney disease stage V -Baseline creatinine appears to be around 4.5 -Admission creatinine 5.8 -Currently, creatinine is 5.4 -No evidence of hydronephrosis on imaging -Continue IV hydration  Non-anion gap metabolic acidosis -Related to GI losses with persistent diarrhea and vomiting -Change IV fluids to bicarbonate infusion in dextrose -He does not appear septic or  toxic -Continue to monitor Serum bicarb 9 Continue bicarb drip Add p.o. bicarb replacement  Resolved hypotension -Suspect is related to volume depletion in the setting of dehydration -Sepsis has been ruled out -Overall blood pressures are improving with IV hydration -We will hold off on further antibiotics  History of hypertension -Holding amlodipine in light of low blood pressures on admission Continue to hold off oral antihypertensives  Sinus tachycardia O2 saturation 100% Continue IV fluid hydration  Diabetes -Having episodes of hypoglycemia -Home medications -Continue sliding scale  Metastatic renal cancer -Continue follow-up with Dr. Alen Blew  Hyperlipidemia -Continue statin  BPH -Continue on Flomax  DVT prophylaxis: heparin injection 5,000 Units Start: 05/23/21 2200  Code Status: Full code Family Communication: Discussed with his fiance at bedside Disposition Plan: Status is: Inpatient  Remains inpatient appropriate because: Continued work-up/management of acute kidney injury, metabolic acidosis and persistent diarrhea.         Consultants:  Gastroenterology  Procedures:    Antimicrobials:       Objective: Vitals:   05/24/21 1317 05/24/21 1331 05/24/21 2124 05/25/21 0401  BP: 94/64 (!) 96/52 108/62 119/70  Pulse: 92  99 (!) 108  Resp: 16  16 16   Temp: 97.7 F (36.5 C)  98.5 F (36.9 C) 98.2 F (36.8 C)  TempSrc: Oral  Oral Oral  SpO2: 100%  100% 100%  Weight:      Height:        Intake/Output Summary (Last 24 hours) at 05/25/2021 1335 Last data filed at 05/25/2021 1326 Gross per 24 hour  Intake 2892.86 ml  Output 425 ml  Net 2467.86 ml   Filed Weights   05/23/21  0456  Weight: 84.4 kg    Examination:  General exam: Well-developed well-nourished in no acute distress.  He is alert and interactive. Respiratory system: Clear to auscultation no wheezes or rales.   Cardiovascular system: \Tachycardic no rubs or  gallops. Gastrointestinal system: Abdomen soft nontender bowel sounds present.   Central nervous system: Alert and interactive.  Moves all 4 extremities.  Nonfocal exam.   Extremities: No lower extremity edema bilaterally. Skin: No rashes or ulcerative lesions noted. Psychiatry: Mood is appropriate for condition and setting..     Data Reviewed: I have personally reviewed following labs and imaging studies  CBC: Recent Labs  Lab 05/20/21 1525 05/23/21 0422 05/24/21 0341  WBC 6.5 10.2 7.0  NEUTROABS 5.1 8.6*  --   HGB 10.3* 11.2* 10.3*  HCT 30.8* 33.2* 31.3*  MCV 91.5 92.0 93.7  PLT 326.0 338 527   Basic Metabolic Panel: Recent Labs  Lab 05/20/21 1525 05/23/21 0422 05/23/21 1636 05/24/21 0341  NA 133* 134* 140 140  K 4.8 4.8 4.7 4.4  CL 115* 117* 119* 120*  CO2 11* 9* 11* 9*  GLUCOSE 95 114* 94 87  BUN 57* 74* 72* 62*  CREATININE 4.67* 5.80* 5.48* 5.41*  CALCIUM 9.5 9.2 8.8* 9.0  PHOS  --   --  6.6*  --    GFR: Estimated Creatinine Clearance: 12.9 mL/min (A) (by C-G formula based on SCr of 5.41 mg/dL (H)). Liver Function Tests: Recent Labs  Lab 05/20/21 1525 05/23/21 0422 05/23/21 1636 05/24/21 0341  AST 21 22  --  18  ALT 25 30  --  25  ALKPHOS 113 111  --  98  BILITOT 0.2 0.4  --  0.4  PROT 6.1 6.5  --  5.8*  ALBUMIN 3.4* 3.1* 2.7* 2.7*   Recent Labs  Lab 05/23/21 0422  LIPASE 48   No results for input(s): AMMONIA in the last 168 hours. Coagulation Profile: Recent Labs  Lab 05/23/21 0422 05/24/21 0341  INR 1.2 1.2   Cardiac Enzymes: No results for input(s): CKTOTAL, CKMB, CKMBINDEX, TROPONINI in the last 168 hours. BNP (last 3 results) No results for input(s): PROBNP in the last 8760 hours. HbA1C: Recent Labs    05/23/21 1844  HGBA1C 5.8*   CBG: Recent Labs  Lab 05/24/21 2104 05/25/21 0030 05/25/21 0403 05/25/21 0803 05/25/21 1144  GLUCAP 77 116* 90 87 100*   Lipid Profile: No results for input(s): CHOL, HDL, LDLCALC, TRIG,  CHOLHDL, LDLDIRECT in the last 72 hours. Thyroid Function Tests: No results for input(s): TSH, T4TOTAL, FREET4, T3FREE, THYROIDAB in the last 72 hours. Anemia Panel: No results for input(s): VITAMINB12, FOLATE, FERRITIN, TIBC, IRON, RETICCTPCT in the last 72 hours. Sepsis Labs: Recent Labs  Lab 05/23/21 0422 05/24/21 0341  PROCALCITON  --  0.63  LATICACIDVEN 1.0  --     Recent Results (from the past 240 hour(s))  Resp Panel by RT-PCR (Flu A&B, Covid) Nasopharyngeal Swab     Status: None   Collection Time: 05/23/21  3:59 AM   Specimen: Nasopharyngeal Swab; Nasopharyngeal(NP) swabs in vial transport medium  Result Value Ref Range Status   SARS Coronavirus 2 by RT PCR NEGATIVE NEGATIVE Final    Comment: (NOTE) SARS-CoV-2 target nucleic acids are NOT DETECTED.  The SARS-CoV-2 RNA is generally detectable in upper respiratory specimens during the acute phase of infection. The lowest concentration of SARS-CoV-2 viral copies this assay can detect is 138 copies/mL. A negative result does not preclude SARS-Cov-2 infection and should  not be used as the sole basis for treatment or other patient management decisions. A negative result may occur with  improper specimen collection/handling, submission of specimen other than nasopharyngeal swab, presence of viral mutation(s) within the areas targeted by this assay, and inadequate number of viral copies(<138 copies/mL). A negative result must be combined with clinical observations, patient history, and epidemiological information. The expected result is Negative.  Fact Sheet for Patients:  EntrepreneurPulse.com.au  Fact Sheet for Healthcare Providers:  IncredibleEmployment.be  This test is no t yet approved or cleared by the [place] FDA and  has been authorized for detection and/or diagnosis of SARS-CoV-2 by FDA under an Emergency Use Authorization (EUA). This EUA will remain  in effect (meaning  this test can be used) for the duration of the COVID-19 declaration under Section 564(b)(1) of the Act, 21 U.S.C.section 360bbb-3(b)(1), unless the authorization is terminated  or revoked sooner.       Influenza A by PCR NEGATIVE NEGATIVE Final   Influenza B by PCR NEGATIVE NEGATIVE Final    Comment: (NOTE) The Xpert Xpress SARS-CoV-2/FLU/RSV plus assay is intended as an aid in the diagnosis of influenza from Nasopharyngeal swab specimens and should not be used as a sole basis for treatment. Nasal washings and aspirates are unacceptable for Xpert Xpress SARS-CoV-2/FLU/RSV testing.  Fact Sheet for Patients: EntrepreneurPulse.com.au  Fact Sheet for Healthcare Providers: IncredibleEmployment.be  This test is not yet approved or cleared by the [place] FDA and has been authorized for detection and/or diagnosis of SARS-CoV-2 by FDA under an Emergency Use Authorization (EUA). This EUA will remain in effect (meaning this test can be used) for the duration of the COVID-19 declaration under Section 564(b)(1) of the Act, 21 U.S.C. section 360bbb-3(b)(1), unless the authorization is terminated or revoked.  Performed at [facility], [place] [address], [place], [place] [address]   Blood culture (routine x 2)     Status: None (Preliminary result)   Collection Time: 05/23/21  4:22 AM   Specimen: BLOOD  Result Value Ref Range Status   Specimen Description   Final    BLOOD BLOOD RIGHT FOREARM Performed at [place] [address] [place], [place] [address]    Special Requests   Final    BOTTLES DRAWN AEROBIC ONLY Blood Culture adequate volume Performed at [place] [address] [place], [place] [address]    Culture   Final    NO GROWTH 1 DAY Performed at [facility], [place] [address] [place], [place] [address]    Report Status PENDING  Incomplete  Blood culture (routine x  2)     Status: None (Preliminary result)   Collection Time: 05/23/21  4:42 AM   Specimen: BLOOD LEFT HAND  Result Value Ref Range Status   Specimen Description   Final    BLOOD LEFT HAND Performed at [facility] [address], [place], [place] [address]    Special Requests   Final    BOTTLES DRAWN AEROBIC AND ANAEROBIC Blood Culture results may not be optimal due to an inadequate volume of blood received in culture bottles Performed at [facility] [address] [place], [place] [address]    Culture   Final    NO GROWTH 1 DAY Performed at [facility], [place] [address], [place], [place] [address]    Report Status PENDING  Incomplete  C Difficile Quick Screen w PCR reflex  Status: None   Collection Time: 05/23/21  5:02 AM   Specimen: STOOL  Result Value Ref Range Status   C Diff antigen NEGATIVE NEGATIVE Final   C Diff toxin NEGATIVE NEGATIVE Final   C Diff interpretation No C. difficile detected.  Final    Comment: Performed at [facility], [place] [address], [place], [place] [address]  Gastrointestinal Panel by PCR , Stool     Status: None   Collection Time: 05/23/21  5:02 AM   Specimen: STOOL  Result Value Ref Range Status   Campylobacter species NOT DETECTED NOT DETECTED Final   Plesimonas shigelloides NOT DETECTED NOT DETECTED Final   Salmonella species NOT DETECTED NOT DETECTED Final   Yersinia enterocolitica NOT DETECTED NOT DETECTED Final   Vibrio species NOT DETECTED NOT DETECTED Final   Vibrio cholerae NOT DETECTED NOT DETECTED Final   Enteroaggregative E coli (EAEC) NOT DETECTED NOT DETECTED Final   Enteropathogenic E coli (EPEC) NOT DETECTED NOT DETECTED Final   Enterotoxigenic E coli (ETEC) NOT DETECTED NOT DETECTED Final   Shiga like toxin producing E coli (STEC) NOT DETECTED NOT DETECTED Final   Shigella/Enteroinvasive E coli (EIEC) NOT DETECTED NOT DETECTED Final   Cryptosporidium NOT DETECTED  NOT DETECTED Final   Cyclospora cayetanensis NOT DETECTED NOT DETECTED Final   Entamoeba histolytica NOT DETECTED NOT DETECTED Final   Giardia lamblia NOT DETECTED NOT DETECTED Final   Adenovirus F40/41 NOT DETECTED NOT DETECTED Final   Astrovirus NOT DETECTED NOT DETECTED Final   Norovirus GI/GII NOT DETECTED NOT DETECTED Final   Rotavirus A NOT DETECTED NOT DETECTED Final   Sapovirus (I, II, IV, and V) NOT DETECTED NOT DETECTED Final    Comment: Performed at [facility], [address] [place], [place] [address]  Urine Culture     Status: None   Collection Time: 05/23/21  9:02 AM   Specimen: Urine, Clean Catch  Result Value Ref Range Status   Specimen Description   Final    URINE, CLEAN CATCH Performed at [facility], [place] [address] [place], [place] [address]    Special Requests   Final    NONE Performed at [facility], [place] [address], [place], [place] [address]    Culture   Final    NO GROWTH Performed at [facility], [place] [address], [place], [place] [address]    Report Status 05/24/2021 FINAL  Final         Radiology Studies: No results found.      Scheduled Meds:  (feeding supplement) PROSource Plus  30 mL Oral BID BM   aspirin EC  81 mg Oral Daily   atorvastatin  20 mg Oral QHS   darifenacin  7.5 mg Oral Daily   feeding supplement  1 Container Oral BID BM   heparin  5,000 Units Subcutaneous Q8H   insulin aspart  0-6 Units Subcutaneous TID WC   loperamide  2 mg Oral TID   mouth rinse  15 mL Mouth Rinse BID   multivitamin with minerals  1 tablet Oral Daily   ondansetron (ZOFRAN) IV  4 mg Intravenous Once   tamsulosin  0.4 mg Oral Daily   Continuous Infusions:  sodium bicarbonate 150 mEq in D5W infusion 125 mL/hr at 05/25/21 1129     LOS: 2 days    Time spent: 35 minutes    Kayleen Memos, MD [facility]   If 7PM-7AM, please contact night-coverage [email or web address]  05/25/2021, 1:35  PM

## 2021-05-25 NOTE — Progress Notes (Signed)
Patient had two episodes of nausea and vomiting throughout the nightshift. Gave PRN Compazine twice for each episode, which was effective.

## 2021-05-25 NOTE — Consult Note (Addendum)
Referring Provider: [facility] PCP: [name], MD   Gastroenterologist: Scarlette Shorts, MD  Reason for consultation:    N/V/D               ASSESSMENT / PLAN   #   76 yo male admitted 05/23/21 with three week history of nausea, vomiting and diarrhea. Admitted with dehydration / shock ( probably hypovolemic), AKI on CKD.     Query Nivolumab induced colitis? No clear etiology for nausea /vomiting at this point      --GI path panel negative. C-diff negative. Fluid filled colon on non-contrast CTAP. No leukocytosis. Blood cx negative so far. Abdomen non-tender --I don't think there is an immediate need for further GI workup.  He does feel like symptoms have improved since admission.. Started on scheduled imodium 2mg  TID yesterday.  Two BMs thus far this am. Patient says he is tolerating certain kinds of food without vomiting. He has IV Compazine ordered Q 6 prn but taking only one dose a day. If having significant N/V could change to scheduled Zofran which may also help with the diarrhea.  --Current stool studies negative for C-diff   #  Remote history of small duodenal carcinoid rectal at [facility]. Negative EUS at [facility] in 2018.   # CKD on AKI ( POA)  # Metastatic right RCC, most recently treated  with Nivolumab  # Additional medical history listed below.     [facility] Attending   I have taken an interval history, reviewed the chart and examined the patient. I agree with the Advanced Practitioner's note, impression and recommendations.  Majority the medical decision-making in the formulation of the assessment and plan were performed by me.  The balance of evidence points to checkpoint inhibitor toxicity (nivulomab) which started w/ severe desquamating illness in December and must have been followed by intestinal problems - colitis. Though still ill, he is improved. Aggressive supportive care as you are. Steroids are an option for treatment but given DM and trend to improvement  hold off. Will see how it goes.  He is not tolerating solid food well - will drop back to full liquids.  Explained that he needs to be stronger and able to self-hydrate to be dc.  Gatha Mayer, MD, [facility] 05/25/2021 4:11 PM    HISTORY OF PRESENT ILLNESS                                                                                                                         Chief Complaint: nausea, vomiting and diarrhea  Antonio Adams is a 76 y.o. male with a past medical history significant PUD, duodenal carcinoid resected at [facility], adenomatous colon polyps,  IDA, diabetes mellitus, hyperlipidemia, sleep apnea, right-sided renal cell carcinoma, right adrenalectomy , resection of recurrent right kidney tumor, liver metastases s/p ablation,  CKD IV, prostate cancer , radiation proctitis.  See PMH for any additional medical history   Antonio Adams was last  seen by [place] in July 2018 for rectal bleeding. He subsequently underwent flexible sigmoidoscopy with APC of radiation proctitis. He is followed by Dr. Alen Blew for papillary renal cell carcinoma diagnosed in 2017 ( initially localized disease) but now has metastatic disease with hepatic involvement. He is s/p right adrenalectomy and also resection of recurrent right kidney tumor in Oct 2022. Pathology c/w papillary RCC.  In mid October patient started Nivolumab. Q 4 weeks.   ED course:  Patient seen in ED 05/16/21 with abdominal pain / dark diarrhea.  Labs were around baseline with hgb of 8.7. WBC was normal. Creatinine 4.3. Stools heme negative.  Given IVF, released home.   Patient saw PCP in follow up on 1/6 and subsequently sent to ED for lab abnormalities. Apparently patient wanted to go home and workup wasn't completed.   He returned to ED by EMS on 05/23/21. On arrival he was in shock with hypothermia, hypotension. CTAP >> fluid throughout large bowel. COVID / influenza negative. Had AKI on CKD. Sepsis protocol  initiated.    Antonio Adams says he developed nausea, vomiting and diarrhea at the same time about 3 weeks ago. Stools non-bloody. He is averages about three -five loose BMs a day ( at its worst). The nausea / vomiting have been daily. Sometimes he just "spits up" but does described true nausea. He reports a recent 20 pound weight loss. Other than chemotherapy he doesn't think he recently started any new medications except for a few doses of Xyzal for a skin reaction to chemotherapy  IMAGING:   05/23/21 Non con CTAP IMPRESSION: 1. Fluid throughout the large bowel compatible with diarrhea. Normal appendix, and no discrete bowel inflammation or evidence of bowel obstruction.   2. Otherwise stable noncontrast CT appearance of the abdomen and pelvis since the restaging CTs last month, including the abnormal right renal space (please see that report).   3. Aortic Atherosclerosis (ICD10-I70.0)  ENDOSCOPIC EVALUATIONS    Colonoscopy 2017 - polyp surveillance --Radiation proctitis - two 2-5 mm polyps removed --Internal hemorrhoids --Polyp path >> Tubular adenomas   EGD October 2017 for surveillance ( history of duodenal carcinoid) --Single 14 mm broad based polyp in duodenal bulb. Biopsies >> peptic duodenitis with polypoid Brunner gland aggregates.   EUS April 2018 [facility] for follow up of duodenal carcinoid --No pathology of duodenum --Pancreatic parenchyma - hyperechoic focil and lobularity in pancreatic head  Flexible sigmoidoscopy August 2018 --Radiation proctitis --treated with APC.    PREVIOUS IMAGING    05/06/21 Non Con CTAP IMPRESSION: Status post partial right nephrectomy, right adrenalectomy, and microwave ablation of the posterior right hepatic lobe metastasis.   2.8 cm lesion in the posterior right hepatic lobe, improved.   Nodularity in the right adrenalectomy bed and hepatorenal fossa, grossly unchanged.   Brachytherapy seeds in the prostate.   No  evidence of metastatic disease in the chest.  Past Medical History:  Diagnosis Date   ADENOCARCINOMA, PROSTATE, GLEASON GRADE 6 12/21/2009   prostate cancer   ALLERGIC RHINITIS 03/16/2007   Allergy    Anxiety    Arthritis    back    BACK PAIN WITH RADICULOPATHY 11/23/2008   Cancer of kidney ([place])    partial right kidney removed   CARPAL TUNNEL SYNDROME, BILATERAL 03/16/2007   Chronic back pain    Constipation    takes Senokot daily   Depression    occasionally   DIABETES MELLITUS, TYPE II 03/16/2007   takes Januvia and MEtformin daily  GASTROINTESTINAL HEMORRHAGE, HX OF 03/16/2007   GERD 03/16/2007   takes Omeprazole daily   Glaucoma    mild - no eye drops   Hemorrhoids    History of colon polyps    HYPERLIPIDEMIA 03/16/2007   takes Crestor daily   HYPERTENSION 03/16/2007   takes Diltiazem and Lisinopril daily    Kidney cancer, primary, with metastasis from kidney to other site [facility])    LEG CRAMPS 05/22/2007   Nocturia    Overactive bladder    PARESTHESIA 05/21/2007   Proctitis    PTSD (post-traumatic stress disorder)    wakes up may be dreaming of fighting   Rectal bleeding    Dr.Norins has explained its from the Radiation that he has received   Rectal bleeding    Renal cell carcinoma ([place])    Sleep apnea    uses CPAP nightly   Stomach cancer ([place])    Urinary frequency    takes Toviaz daily    Past Surgical History:  Procedure Laterality Date   ADRENALECTOMY Right 02/26/2020   Procedure: OPEN RIGHT RENAL CELL METASTATECTOMY AND  ADRENALECTOMY;  Surgeon: [name], MD;  Location: [facility];  Service: Urology;  Laterality: Right;   [place], 2008   repeat surgery; ESI '08   [place]     bilateral   CERVICAL FUSION     COLONOSCOPY     colonosocpy     ESOPHAGOGASTRODUODENOSCOPY     ED   FLEXIBLE SIGMOIDOSCOPY N/A 01/08/2017   Procedure: FLEXIBLE SIGMOIDOSCOPY;  Surgeon: Irene Shipper, MD;  Location: [place];  Service: Endoscopy;   Laterality: N/A;   HOT HEMOSTASIS N/A 01/08/2017   Procedure: HOT HEMOSTASIS (ARGON PLASMA COAGULATION/BICAP);  Surgeon: Irene Shipper, MD;  Location: [facility]COPY;  Service: Endoscopy;  Laterality: N/A;   IR RADIOLOGIST EVAL & MGMT  12/17/2019   IR RADIOLOGIST EVAL & MGMT  07/01/2020   IR RADIOLOGIST EVAL & MGMT  08/25/2020   LUMBAR LAMINECTOMY  10/30/2011   Procedure: MICRODISCECTOMY LUMBAR LAMINECTOMY;  Surgeon: Jessy Oto, MD;  Location: [place];  Service: Orthopedics;  Laterality: Right;  Right L4-5 and L5-S1 Microdiscectomy   LUMBAR LAMINECTOMY  06/17/2012   Procedure: MICRODISCECTOMY LUMBAR LAMINECTOMY;  Surgeon: Jessy Oto, MD;  Location: [place];  Service: Orthopedics;  Laterality: N/A;  Right L5-S1 microdiscectomy   OPEN PARTIAL HEPATECTOMY  N/A 02/26/2020   Procedure: OPEN PARTIAL HEPATECTOMY, CHOLECYSTECTOMY;  Surgeon: Stark Klein, MD;  [facility]: [facility];  Service: General;  Laterality: N/A;   POLYPECTOMY     PROSTATE SURGERY  march 2012   seed implant   RADIOFREQUENCY ABLATION N/A 07/21/2020   Procedure: CT MICROWAVE ABLATION;  Surgeon: Criselda Peaches, MD;  Location: [facility];  Service: Anesthesiology;  Laterality: N/A;   ROBOTIC ASSITED PARTIAL NEPHRECTOMY Right 09/08/2015   Procedure: XI ROBOTIC ASSITED RIGHT PARTIAL NEPHRECTOMY;  Surgeon: [name], MD;  [facility];  Service: Urology;  Laterality: Right;  Clamp on: 1033 Clamp off: 1103 Total Clamp Time: 30 minutes   SHOULDER ARTHROSCOPY WITH SUBACROMIAL DECOMPRESSION Left 01/20/2016   Procedure: LEFT SHOULDER ARTHROSCOPY WITH EXTENSIVE  DEBRIDEMENT, ACROMIOPLASTY;  Surgeon: Ninetta Lights, MD;  Location: [place];  Service: Orthopedics;  Laterality: Left;   SHOULDER SURGERY  LFT   Stress Cardiolite  12/24/2003   negative for ischemia   UPPER ESOPHAGEAL ENDOSCOPIC ULTRASOUND (EUS)  04/18/2016   [facility]   UPPER GASTROINTESTINAL ENDOSCOPY  WOUND EXPLORATION     management of a wound that he  sustained in the [place]    Prior to Admission medications   Medication Sig Start Date End Date Taking? Authorizing Provider  aspirin EC 81 MG tablet Take 81 mg by mouth daily. Swallow whole.   Yes [provider]  atorvastatin (LIPITOR) 40 MG tablet Take 1 tablet (40 mg total) by mouth daily. Patient taking differently: Take 20 mg by mouth at bedtime. 12/20/19  Yes [name], MD  cholecalciferol (VITAMIN D) 1000 units tablet Take 1,000 Units by mouth daily.   Yes [provider]  ferrous sulfate 325 (65 FE) MG tablet Take 1 tablet (325 mg total) by mouth 2 (two) times daily. Patient needs office visit for further refills 02/24/19  Yes Irene Shipper, MD  fluocinonide-emollient (LIDEX-E) 0.05 % cream Apply 1 application topically 2 (two) times daily. 04/18/21  Yes [name], MD  gabapentin (NEURONTIN) 100 MG capsule Take 100 mg by mouth at bedtime.   Yes [provider]  gemfibrozil (LOPID) 600 MG tablet Take 600 mg by mouth 2 (two) times daily before a meal.   Yes [provider]  indapamide (LOZOL) 1.25 MG tablet TAKE 1 TABLET BY MOUTH EVERY DAY 12/20/20  Yes [name], MD  LOKELMA 10 g PACK packet Take 10 g by mouth every Monday, Wednesday, and Friday. 05/04/21  Yes [provider]  Omega-3 Fatty Acids (FISH OIL PO) Take 1 tablet by mouth daily.   Yes [provider]  OVER THE COUNTER MEDICATION Take 1 tablet by mouth daily. Focus factor   Yes [provider]  polyvinyl alcohol (LIQUIFILM TEARS) 1.4 % ophthalmic solution Place 1 drop into both eyes as needed for dry eyes.   Yes [provider]  solifenacin (VESICARE) 5 MG tablet Take 1 tablet (5 mg total) by mouth daily. 12/28/20  Yes [name], MD  tamsulosin (FLOMAX) 0.4 MG CAPS capsule TAKE 1 CAPSULE (0.4 MG TOTAL) BY MOUTH DAILY. Patient taking differently: Take 0.4 mg by mouth daily. 01/20/14  Yes [name], MD  amLODipine (NORVASC) 5 MG tablet  Take 1 tablet (5 mg total) by mouth daily. Patient not taking: Reported on 05/23/2021 05/23/20   [name], MD  Continuous Blood Gluc Receiver (FREESTYLE LIBRE 14 DAY READER) DEVI 1 Act by Does not apply route daily. 05/23/20   [name], MD  Continuous Blood Gluc Sensor (FREESTYLE LIBRE 2 SENSOR) MISC 1 Act by Does not apply route daily. 05/23/20   [name], MD  insulin glargine (LANTUS) 100 UNIT/ML injection Inject 70 Units into the skin daily as needed (for hyperglycemia). Patient not taking: Reported on 05/23/2021    [provider]  Insulin Pen Needle 32G X 6 MM MISC 1 Act by Does not apply route daily. 05/23/20   [name], MD  ONE TOUCH ULTRA TEST test strip USE 2 (TWO) TIMES DAILY. AND LANCETS 2/DAY 250.03 11/05/16   [name], MD  traMADol (ULTRAM) 50 MG tablet Take 50-100 mg by mouth every 6 (six) hours as needed for pain. 06/03/20   [provider]    Current Facility-Administered Medications  Medication Dose Route Frequency Provider Last Rate Last Admin   (feeding supplement) PROSource Plus liquid 30 mL  30 mL Oral BID BM Kathie Dike, MD   30 mL at 05/24/21 2146   acetaminophen (TYLENOL) tablet 650 mg  650 mg Oral Q6H PRN  Cherylann Ratel A, DO       Or   acetaminophen (TYLENOL) suppository 650 mg  650 mg Rectal Q6H PRN Marylyn Ishihara, Tyrone A, DO       aspirin EC tablet 81 mg  81 mg Oral Daily Kyle, Tyrone A, DO   81 mg at 05/25/21 3614   atorvastatin (LIPITOR) tablet 20 mg  20 mg Oral QHS Kyle, Tyrone A, DO   20 mg at 05/24/21 2150   darifenacin (ENABLEX) 24 hr tablet 7.5 mg  7.5 mg Oral Daily Kyle, Tyrone A, DO   7.5 mg at 05/25/21 0916   feeding supplement (BOOST / RESOURCE BREEZE) liquid 1 Container  1 Container Oral BID BM Kathie Dike, MD   1 Container at 05/25/21 0921   heparin injection 5,000 Units  5,000 Units Subcutaneous Q8H Kyle, Tyrone A, DO   5,000 Units at 05/25/21 0516   insulin aspart (novoLOG) injection 0-6 Units  0-6 Units  Subcutaneous TID WC Kyle, Tyrone A, DO       loperamide (IMODIUM) capsule 2 mg  2 mg Oral TID Kathie Dike, MD   2 mg at 05/25/21 4315   MEDLINE mouth rinse  15 mL Mouth Rinse BID Marylyn Ishihara, Tyrone A, DO   15 mL at 05/24/21 2154   multivitamin with minerals tablet 1 tablet  1 tablet Oral Daily Kathie Dike, MD   1 tablet at 05/25/21 0917   ondansetron (ZOFRAN) injection 4 mg  4 mg Intravenous Once Marylyn Ishihara, Tyrone A, DO       polyvinyl alcohol (LIQUIFILM TEARS) 1.4 % ophthalmic solution 1 drop  1 drop Both Eyes PRN Marylyn Ishihara, Tyrone A, DO       prochlorperazine (COMPAZINE) injection 10 mg  10 mg Intravenous Q6H PRN Marylyn Ishihara, Tyrone A, DO   10 mg at 05/25/21 0356   sodium bicarbonate 150 mEq in dextrose 5 % 1,150 mL infusion   Intravenous Continuous Kathie Dike, MD 125 mL/hr at 05/24/21 2146 New Bag at 05/24/21 2146   tamsulosin (FLOMAX) capsule 0.4 mg  0.4 mg Oral Daily Kyle, Tyrone A, DO   0.4 mg at 05/25/21 4008    Allergies as of 05/23/2021 - Review Complete 05/23/2021  Allergen Reaction Noted   Ace inhibitors Swelling and Other (See Comments) 10/06/2015   Opdivo [nivolumab] Rash 04/18/2021    Family History  Problem Relation Age of Onset   Cancer Sister 45       Cancer, unsure type   Hypertension Other    Colon cancer Neg Hx    Esophageal cancer Neg Hx    Stomach cancer Neg Hx    Diabetes Neg Hx    Colon polyps Neg Hx    Rectal cancer Neg Hx     Social History   Socioeconomic History   Marital status: Widowed    Spouse name: Deneise Lever   Number of children: 3   Years of education: 12+   Highest education level: Not on file  Occupational History   Occupation: Event organiser; Banker: RETIRED    Comment: retired from Event organiser  Tobacco Use   Smoking status: Never   Smokeless tobacco: Never  Vaping Use   Vaping Use: Never used  Substance and Sexual Activity   Alcohol use: Not Currently    Alcohol/week: 0.0 standard drinks   Drug use: No   Sexual activity: Not  Currently    Partners: Female  Other Topics Concern   Not on file  Social History Narrative  Married-divorced '89, remarried '96. 2 sons, 1 daughter. 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 He wishes to be a full resuscitation candidate.       Social Determinants of Health   Financial Resource Strain: Low Risk    Difficulty of Paying Living Expenses: Not hard at all  Food Insecurity: No Food Insecurity   Worried About Charity fundraiser in the Last Year: Never true   [place] in the Last Year: Never true  Transportation Needs: No Transportation Needs   Lack of Transportation (Medical): No   Lack of Transportation (Non-Medical): No  Physical Activity: Inactive   Days of Exercise per Week: 0 days   Minutes of Exercise per Session: 0 min  Stress: No Stress Concern Present   Feeling of Stress : Not at all  Social Connections: Moderately Integrated   Frequency of Communication with Friends and Family: More than three times a week   Frequency of Social Gatherings with Friends and Family: Once a week   Attends Religious Services: More than 4 times per year   Active Member of [employer] or Organizations: No   Attends Music therapist: More than 4 times per year   Marital Status: Divorced  Human resources officer Violence: Not At Risk   Fear of Current or Ex-Partner: No   Emotionally Abused: No   Physically Abused: No   Sexually Abused: No    Review of Systems: All systems reviewed and negative except where noted in HPI.   OBJECTIVE    Physical Exam: Vital signs in last 24 hours: Temp:  [97.7 F (36.5 C)-98.5 F (36.9 C)] 98.2 F (36.8 C) (01/11 0401) Pulse Rate:  [92-108] 108 (01/11 0401) Resp:  [16] 16 (01/11 0401) BP: (94-119)/(52-70) 119/70 (01/11 0401) SpO2:  [100 %] 100 % (01/11 0401) Last BM Date: 05/24/21  General:  Alert male in NAD Psych:  Pleasant, cooperative. Normal mood and affect Eyes:  Pupils equal, no icterus. Conjunctive pink Ears:  Normal auditory acuity Nose: No deformity, discharge or lesions Neck:  Supple, no masses felt Lungs:  Clear to auscultation.  Heart:  Regular rate, regular rhythm. No lower extremity edema Abdomen:  Soft, nondistended, nontender, active bowel sounds, no masses felt  Msk: Symmetrical without gross deformities.  Neurologic:  Alert, oriented, grossly normal neurologically Skin:  Intact without significant lesions.    Scheduled inpatient medications  (feeding supplement) PROSource Plus  30 mL Oral BID BM   aspirin EC  81 mg Oral Daily   atorvastatin  20 mg Oral QHS   darifenacin  7.5 mg Oral Daily   feeding supplement  1 Container Oral BID BM   heparin  5,000 Units Subcutaneous Q8H   insulin aspart  0-6 Units Subcutaneous TID WC   loperamide  2 mg Oral TID   mouth rinse  15 mL Mouth Rinse BID   multivitamin with minerals  1 tablet Oral Daily   ondansetron (ZOFRAN) IV  4 mg Intravenous Once   tamsulosin  0.4 mg Oral Daily      Intake/Output from previous day: 01/10 0701 - 01/11 0700 In: 2692.9 [P.O.:480; I.V.:2112.9; IV Piggyback:100] Out: 201 [Urine:200; Stool:1] Intake/Output this shift: Total I/O In: 320 [P.O.:320] Out: -    Lab Results: Recent Labs    05/23/21 0422 05/24/21 0341  WBC 10.2 7.0  HGB 11.2* 10.3*  HCT 33.2* 31.3*  PLT 338 299   BMET Recent Labs  05/23/21 0422 05/23/21 1636 05/24/21 0341  NA 134* 140 140  K 4.8 4.7 4.4  CL 117* 119* 120*  CO2 9* 11* 9*  GLUCOSE 114* 94 87  BUN 74* 72* 62*  CREATININE 5.80* 5.48* 5.41*  CALCIUM 9.2 8.8* 9.0   LFTs Recent Labs    05/24/21 0341  PROT 5.8*  ALBUMIN 2.7*  AST 18  ALT 25  ALKPHOS 98  BILITOT 0.4   PT/INR Recent Labs    05/23/21 0422 05/24/21 0341  LABPROT 14.9 15.1  INR 1.2 1.2     . CBC Latest Ref Rng & Units 05/24/2021 05/23/2021 05/20/2021  WBC 4.0 - 10.5 K/uL 7.0 10.2 6.5  Hemoglobin 13.0 - 17.0 g/dL 10.3(L) 11.2(L) 10.3(L)   Hematocrit 39.0 - 52.0 % 31.3(L) 33.2(L) 30.8(L)  Platelets 150 - 400 K/uL 299 338 326.0    . CMP Latest Ref Rng & Units 05/24/2021 05/23/2021 05/23/2021  Glucose 70 - 99 mg/dL 87 94 114(H)  BUN 8 - 23 mg/dL 62(H) 72(H) 74(H)  Creatinine 0.61 - 1.24 mg/dL 5.41(H) 5.48(H) 5.80(H)  Sodium 135 - 145 mmol/L 140 140 134(L)  Potassium 3.5 - 5.1 mmol/L 4.4 4.7 4.8  Chloride 98 - 111 mmol/L 120(H) 119(H) 117(H)  CO2 22 - 32 mmol/L 9(L) 11(L) 9(L)  Calcium 8.9 - 10.3 mg/dL 9.0 8.8(L) 9.2  Total Protein 6.5 - 8.1 g/dL 5.8(L) - 6.5  Total Bilirubin 0.3 - 1.2 mg/dL 0.4 - 0.4  Alkaline Phos 38 - 126 U/L 98 - 111  AST 15 - 41 U/L 18 - 22  ALT 0 - 44 U/L 25 - 30     Active Problems:   Type 2 diabetes mellitus with diabetic cataract (HCC)   Essential hypertension   Metastatic renal cell carcinoma (HCC)   Metabolic acidosis   Dehydration   CKD (chronic kidney disease) stage 5, GFR less than 15 ml/min (HCC)   AKI (acute kidney injury) (HCC)   BPH (benign prostatic hyperplasia)    Tye Savoy, NP-C @  05/25/2021, 9:56 AM

## 2021-05-25 NOTE — Plan of Care (Signed)
°  Problem: Activity: Goal: Risk for activity intolerance will decrease Outcome: Progressing   Problem: Coping: Goal: Level of anxiety will decrease Outcome: Progressing   Problem: Elimination: Goal: Will not experience complications related to urinary retention Outcome: Progressing   Problem: Pain Managment: Goal: General experience of comfort will improve Outcome: Progressing   Problem: Safety: Goal: Ability to remain free from injury will improve Outcome: Progressing   Problem: Skin Integrity: Goal: Risk for impaired skin integrity will decrease Outcome: Progressing   Problem: Respiratory: Goal: Ability to maintain adequate ventilation will improve Outcome: Progressing

## 2021-05-25 NOTE — Progress Notes (Signed)
Patient reports poor appetite.  Tolerated breakfast of Frosted Flakes, Boost, and coffee.  OOB for breakfast to chair.  No c/o of pain.  Reports "feeling like I'm going to throw up, but I just spit up."  Denies vomiting.  Angie Fava, RN

## 2021-05-25 NOTE — Evaluation (Signed)
Physical Therapy Evaluation Patient Details Name: Antonio Adams MRN: [id number] DOB: 25-Nov-1945 Today's Date: 05/25/2021  History of Present Illness  76 yo male admitted with metabolic acidosis, N/V/D.  Clinical Impression  On eval, pt required Min A for mobility. She walked ~120 feet with a RW. Pt presents with general weakness, decreased activity tolerance, and impaired gait and balance. Will plan to follow and progress activity as tolerated. Recommend HHPT at this time.        Recommendations for follow up therapy are one component of a multi-disciplinary discharge planning process, led by the attending physician.  Recommendations may be updated based on patient status, additional functional criteria and insurance authorization.  Follow Up Recommendations Home health PT    Assistance Recommended at Discharge    Patient can return home with the following  A little help with walking and/or transfers;A little help with bathing/dressing/bathroom    Equipment Recommendations Rolling walker (2 wheels) (if pt doesn't already have one)  Recommendations for Other Services       Functional Status Assessment Patient has had a recent decline in their functional status and demonstrates the ability to make significant improvements in function in a reasonable and predictable amount of time.     Precautions / Restrictions Precautions Precautions: Fall Restrictions Weight Bearing Restrictions: No      Mobility  Bed Mobility Overal bed mobility: Needs Assistance Bed Mobility: Supine to Sit     Supine to sit: Supervision;HOB elevated     General bed mobility comments: Increased time.    Transfers Overall transfer level: Needs assistance Equipment used: Rolling walker (2 wheels) Transfers: Sit to/from Stand;Bed to chair/wheelchair/BSC Sit to Stand: Min assist Stand pivot transfers: Min guard         General transfer comment: Assist to power up, stabilize, control  descent. Cues for safety, hand placement. Stand pivot, bed to recliner, without RW    Ambulation/Gait Ambulation/Gait assistance: Min assist Gait Distance (Feet): 120 Feet Assistive device: Rolling walker (2 wheels) Gait Pattern/deviations: Step-through pattern;Decreased stride length       General Gait Details: Assist to stabilize intermittently. Slow gait speed. Cues for safety, RW proximity  Stairs            Wheelchair Mobility    Modified Rankin (Stroke Patients Only)       Balance Overall balance assessment: Needs assistance         Standing balance support: Bilateral upper extremity supported Standing balance-Leahy Scale: Poor                               Pertinent Vitals/Pain Pain Assessment: No/denies pain    Home Living Family/patient expects to be discharged to:: Private residence Living Arrangements: Alone Available Help at Discharge: Family;Available PRN/intermittently Type of Home: House Home Access: Stairs to enter         Home Equipment: Conservation officer, nature (2 wheels);Cane - single point      Prior Function Prior Level of Function : Needs assist             Mobility Comments: Ind at baseline ADLs Comments: Ind at baseline     Hand Dominance        Extremity/Trunk Assessment   Upper Extremity Assessment Upper Extremity Assessment: Overall WFL for tasks assessed    Lower Extremity Assessment Lower Extremity Assessment: Generalized weakness    Cervical / Trunk Assessment Cervical / Trunk Assessment: Normal  Communication  Communication: No difficulties  Cognition Arousal/Alertness: Awake/alert Behavior During Therapy: WFL for tasks assessed/performed Overall Cognitive Status: Within Functional Limits for tasks assessed                                          General Comments      Exercises     Assessment/Plan    PT Assessment Patient needs continued PT services  PT Problem List  Decreased strength;Decreased mobility;Decreased activity tolerance;Decreased balance;Decreased knowledge of use of DME       PT Treatment Interventions DME instruction;Gait training;Therapeutic activities;Patient/family education;Therapeutic exercise;Balance training;Functional mobility training    PT Goals (Current goals can be found in the Care Plan section)  Acute Rehab PT Goals Patient Stated Goal: home PT Goal Formulation: With patient/family Time For Goal Achievement: 06/08/21 Potential to Achieve Goals: Good    Frequency Min 3X/week     Co-evaluation               AM-PAC PT "6 Clicks" Mobility  Outcome Measure Help needed turning from your back to your side while in a flat bed without using bedrails?: None Help needed moving from lying on your back to sitting on the side of a flat bed without using bedrails?: None Help needed moving to and from a bed to a chair (including a wheelchair)?: A Little Help needed standing up from a chair using your arms (e.g., wheelchair or bedside chair)?: A Little Help needed to walk in hospital room?: A Little Help needed climbing 3-5 steps with a railing? : A Little 6 Click Score: 20    End of Session Equipment Utilized During Treatment: Gait belt Activity Tolerance: Patient tolerated treatment well Patient left: in chair;with call bell/phone within reach;with family/visitor present   PT Visit Diagnosis: Muscle weakness (generalized) (M62.81);Unsteadiness on feet (R26.81)    Time: 1021-1173 PT Time Calculation (min) (ACUTE ONLY): 29 min   Charges:   PT Evaluation $PT Eval Moderate Complexity: 1 Mod PT Treatments $Gait Training: 8-22 mins           Doreatha Massed, PT Acute Rehabilitation  Office: [phone] Pager: [phone]

## 2021-05-25 NOTE — Progress Notes (Signed)
Patient vomited twice during shift.  The first time after eating some barbecue at lunch, the second time at dinner before he ate.  MD notified.  Angie Fava, RN

## 2021-05-26 ENCOUNTER — Ambulatory Visit: Payer: Medicare Other | Admitting: Nurse Practitioner

## 2021-05-26 DIAGNOSIS — R710 Precipitous drop in hematocrit: Secondary | ICD-10-CM

## 2021-05-26 DIAGNOSIS — N179 Acute kidney failure, unspecified: Secondary | ICD-10-CM | POA: Diagnosis not present

## 2021-05-26 DIAGNOSIS — K521 Toxic gastroenteritis and colitis: Secondary | ICD-10-CM | POA: Diagnosis not present

## 2021-05-26 LAB — RENAL FUNCTION PANEL
Albumin: 2 g/dL — ABNORMAL LOW (ref 3.5–5.0)
Anion gap: 11 (ref 5–15)
BUN: 43 mg/dL — ABNORMAL HIGH (ref 8–23)
CO2: 19 mmol/L — ABNORMAL LOW (ref 22–32)
Calcium: 8.1 mg/dL — ABNORMAL LOW (ref 8.9–10.3)
Chloride: 110 mmol/L (ref 98–111)
Creatinine, Ser: 4.59 mg/dL — ABNORMAL HIGH (ref 0.61–1.24)
GFR, Estimated: 13 mL/min — ABNORMAL LOW (ref >60)
Glucose, Bld: 95 mg/dL (ref 70–99)
Phosphorus: 3.6 mg/dL (ref 2.5–4.6)
Potassium: 2.6 mmol/L — CL (ref 3.5–5.1)
Sodium: 140 mmol/L (ref 135–145)

## 2021-05-26 LAB — GLUCOSE, CAPILLARY
Glucose-Capillary: 100 mg/dL — ABNORMAL HIGH (ref 70–99)
Glucose-Capillary: 103 mg/dL — ABNORMAL HIGH (ref 70–99)
Glucose-Capillary: 112 mg/dL — ABNORMAL HIGH (ref 70–99)
Glucose-Capillary: 81 mg/dL (ref 70–99)
Glucose-Capillary: 82 mg/dL (ref 70–99)
Glucose-Capillary: 87 mg/dL (ref 70–99)
Glucose-Capillary: 89 mg/dL (ref 70–99)

## 2021-05-26 LAB — HEMOGLOBIN AND HEMATOCRIT, BLOOD
HCT: 22.7 % — ABNORMAL LOW (ref 39.0–52.0)
Hemoglobin: 7.8 g/dL — ABNORMAL LOW (ref 13.0–17.0)

## 2021-05-26 LAB — CBC WITH DIFFERENTIAL/PLATELET
Abs Immature Granulocytes: 0.02 10*3/uL (ref 0.00–0.07)
Basophils Absolute: 0 10*3/uL (ref 0.0–0.1)
Basophils Relative: 0 %
Eosinophils Absolute: 0.1 10*3/uL (ref 0.0–0.5)
Eosinophils Relative: 1 %
HCT: 21.4 % — ABNORMAL LOW (ref 39.0–52.0)
Hemoglobin: 7.5 g/dL — ABNORMAL LOW (ref 13.0–17.0)
Immature Granulocytes: 0 %
Lymphocytes Relative: 15 %
Lymphs Abs: 0.7 10*3/uL (ref 0.7–4.0)
MCH: 31.1 pg (ref 26.0–34.0)
MCHC: 35 g/dL (ref 30.0–36.0)
MCV: 88.8 fL (ref 80.0–100.0)
Monocytes Absolute: 0.7 10*3/uL (ref 0.1–1.0)
Monocytes Relative: 14 %
Neutro Abs: 3.4 10*3/uL (ref 1.7–7.7)
Neutrophils Relative %: 70 %
Platelets: 196 10*3/uL (ref 150–400)
RBC: 2.41 MIL/uL — ABNORMAL LOW (ref 4.22–5.81)
RDW: 15.9 % — ABNORMAL HIGH (ref 11.5–15.5)
WBC: 4.8 10*3/uL (ref 4.0–10.5)
nRBC: 0.6 % — ABNORMAL HIGH (ref 0.0–0.2)

## 2021-05-26 LAB — POTASSIUM: Potassium: 3.1 mmol/L — ABNORMAL LOW (ref 3.5–5.1)

## 2021-05-26 MED ORDER — METOCLOPRAMIDE HCL 5 MG/ML IJ SOLN
10.0000 mg | Freq: Four times a day (QID) | INTRAMUSCULAR | Status: DC
  Administered 2021-05-26 – 2021-05-28 (×7): 10 mg via INTRAVENOUS
  Filled 2021-05-26 (×7): qty 2

## 2021-05-26 MED ORDER — POTASSIUM CHLORIDE 10 MEQ/100ML IV SOLN
10.0000 meq | INTRAVENOUS | Status: AC
  Administered 2021-05-26 (×6): 10 meq via INTRAVENOUS
  Filled 2021-05-26: qty 100

## 2021-05-26 MED ORDER — ALUM & MAG HYDROXIDE-SIMETH 200-200-20 MG/5ML PO SUSP
30.0000 mL | ORAL | Status: DC | PRN
  Administered 2021-05-26: 30 mL via ORAL
  Filled 2021-05-26: qty 30

## 2021-05-26 MED ORDER — POTASSIUM CHLORIDE 10 MEQ/100ML IV SOLN
10.0000 meq | INTRAVENOUS | Status: AC
  Administered 2021-05-26 (×2): 10 meq via INTRAVENOUS
  Filled 2021-05-26: qty 100

## 2021-05-26 MED ORDER — SODIUM BICARBONATE 650 MG PO TABS
1300.0000 mg | ORAL_TABLET | Freq: Four times a day (QID) | ORAL | Status: DC
  Administered 2021-05-26 – 2021-05-27 (×6): 1300 mg via ORAL
  Filled 2021-05-26 (×6): qty 2

## 2021-05-26 MED ORDER — SODIUM BICARBONATE 8.4 % IV SOLN
INTRAVENOUS | Status: DC
  Filled 2021-05-26: qty 150

## 2021-05-26 NOTE — TOC Progression Note (Signed)
Transition of Care [facility]) - Progression Note    Patient Details  Name: Antonio Adams MRN: [id number] Date of Birth: [birth year]  Transition of [facility]) CM/SW Contact  Leeroy Cha, RN Phone Number: 05/26/2021, 8:26 AM  Clinical Narrative:    Hhc pt and ot arranged through [place].   Expected Discharge Plan: [place] Barriers to Discharge: Continued Medical Work up  Expected Discharge Plan and Services Expected Discharge Plan: [place]   Discharge Planning Services: CM Consult   Living arrangements for the past 2 months: Single Family Home                           HH Arranged: PT, OT [facility] Agency: [place] Date Speed: 05/26/21 Time [place]: [phone] Representative spoke with at [place]: [place] ([place]) Interventions    Readmission Risk Interventions No flowsheet data found.

## 2021-05-26 NOTE — Care Management Important Message (Signed)
Important Message  Patient Details IM Letter given to the Patient. Name: Antonio Adams MRN: [id number] Date of Birth: 1945/05/24   Medicare Important Message Given:  Yes     Kerin Salen 05/26/2021, 12:53 PM

## 2021-05-26 NOTE — Progress Notes (Addendum)
[facility] Progress Note  CC:  N/V/D  Subjective:  Seems at least the same as yesterday AM, definitely not worse.  Maybe 2 diarrhea stools yesterday.  An episode of vomiting after eating barbeque at lunch so diet was backed down to full liquids again.  No evidence of bleeding.  Hgb 7.5 grams this AM down from 10.3 grams yesterday.  K+ is 2.6.  Objective:  Vital signs in last 24 hours: Temp:  [98 F (36.7 C)-99.1 F (37.3 C)] 99.1 F (37.3 C) (01/12 0420) Pulse Rate:  [50-100] 100 (01/12 0420) Resp:  [16] 16 (01/12 0420) BP: (101-130)/(60-69) 115/68 (01/12 0420) SpO2:  [94 %-100 %] 100 % (01/12 0420) Last BM Date: 05/25/21 General:  Alert, Well-developed, in NAD Heart:  Regular rate and rhythm; no murmurs Pulm:  CTAB.  No W/R/R. Abdomen:  Soft, non-distended.  BS present.  Non-tender.  Extremities:  Without edema. Neurologic:  Alert and oriented x 4;  grossly normal neurologically. Psych:  Alert and cooperative. Normal mood and affect.  Intake/Output from previous day: 01/11 0701 - 01/12 0700 In: 2562.5 [P.O.:1340; I.V.:1222.5] Out: 225 [Urine:225]  Lab Results: Recent Labs    05/24/21 0341 05/26/21 0533  WBC 7.0 4.8  HGB 10.3* 7.5*  HCT 31.3* 21.4*  PLT 299 196   BMET Recent Labs    05/23/21 1636 05/24/21 0341 05/26/21 0533  NA 140 140 140  K 4.7 4.4 2.6*  CL 119* 120* 110  CO2 11* 9* 19*  GLUCOSE 94 87 95  BUN 72* 62* 43*  CREATININE 5.48* 5.41* 4.59*  CALCIUM 8.8* 9.0 8.1*   LFT Recent Labs    05/24/21 0341 05/26/21 0533  PROT 5.8*  --   ALBUMIN 2.7* 2.0*  AST 18  --   ALT 25  --   ALKPHOS 98  --   BILITOT 0.4  --    PT/INR Recent Labs    05/24/21 0341  LABPROT 15.1  INR 1.2   Assessment / Plan: #76 yo male admitted 05/23/21 with three week history of nausea, vomiting and diarrhea. Admitted with dehydration / shock (probably hypovolemic), AKI on CKD.  Query Nivolumab induced colitis? No clear etiology for nausea  /vomiting at this point.      --GI path panel negative. C-diff negative. Fluid filled colon on non-contrast CTAP. No leukocytosis. Blood cx negative so far. Abdomen non-tender. --I don't think there is an immediate need for further GI workup.  He does feel like symptoms have improved since admission and are at least the same, no worse over the past 24 hours. Started on scheduled imodium 2mg  TID 1/10.  Two diarrhea stools yesterday.  He has IV Compazine ordered Q 6 prn but taking only two doses a day with the last at 6 pm last night. If having significant N/V could change to scheduled Zofran which may also help with the diarrhea, but would just leave the same for now.   # Remote history of small duodenal carcinoid resected at [facility]. Negative EUS at [facility] in 2018.    # CKD on AKI:  Improving.   # Metastatic right RCC, most recently treated with Nivolumab (last in November).  #Hypokalemia:  K+ 2.6 this AM. -Replacement per primary team.  #Drop in Hgb:  Hgb 7.5 grams down from 10.3 grams yesterday but no evidence of bleeding.  Still getting IVFs.  I would NOT check an FOBT on him at this point but rather monitor Hgb and watch for any  signs of bleeding--if he is recovering from colitis then it could be positive but likely will not change what we are doing right now with no overt bleeding.   LOS: 3 days   Laban Emperor. Zehr  05/26/2021, 9:16 AM     [facility] Attending   I have taken an interval history, reviewed the chart and examined the patient. I agree with the Advanced Practitioner's note, impression and recommendations.  Majority the medical decision-making in the formulation of the assessment and plan were performed by me.   Still suspect checkpoint inhibitor toxicity  Nauseated again - will start Reglan 10 mg IV q6 F/U Hgb - no signs of GI bleed - consider retroperitoneal bleed though right now no signs or sxs  Following  Gatha Mayer, MD, [facility] 05/26/2021 2:38 PM

## 2021-05-26 NOTE — Evaluation (Signed)
Occupational Therapy Evaluation Patient Details Name: Antonio Adams MRN: [id number] DOB: 07-20-1945 Today's Date: 05/26/2021   History of Present Illness 76 yo male admitted with metabolic acidosis, N/V/D.   Clinical Impression   Mr. 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 He reports he has no therapy needs but his wife is doubtful. On evaluation he demonstrates good upper body strength, independence with bed mobility, able to don his socks, reports independence with ADLs and ability to ambulate without DME in the hallway. Initially he pushed IV pole and then he had no hand hold and had no loss of balance. He is able to stand from low surface without using his hands though that is his habit. Patient able to withstand anterior nudge x 3 without loss of balance. Patient doing well and has no current OT needs.      Recommendations for follow up therapy are one component of a multi-disciplinary discharge planning process, led by the attending physician.  Recommendations may be updated based on patient status, additional functional criteria and insurance authorization.   Follow Up Recommendations  No OT follow up    Assistance Recommended at Discharge PRN  Patient can return home with the following      Functional Status Assessment  Patient has not had a recent decline in their functional status  Equipment Recommendations  None recommended by OT    Recommendations for Other Services       Precautions / Restrictions Precautions Precautions: None Restrictions Weight Bearing Restrictions: No      Mobility Bed Mobility Overal bed mobility: Independent                  Transfers   Equipment used: None               General transfer comment: supervision to ambulate in hall with IV pole and then without. No loss of balance.      Balance Overall balance assessment: No apparent balance deficits (not formally assessed)                                          ADL either performed or assessed with clinical judgement   ADL Overall ADL's : Independent                                             Vision Patient Visual Report: No change from baseline       Perception     Praxis      Pertinent Vitals/Pain Pain Assessment: No/denies pain     Hand Dominance Right   Extremity/Trunk Assessment Upper Extremity Assessment Upper Extremity Assessment: Overall WFL for tasks assessed   Lower Extremity Assessment Lower Extremity Assessment: Defer to PT evaluation       Communication Communication Communication: No difficulties   Cognition Arousal/Alertness: Awake/alert Behavior During Therapy: [facility] for tasks assessed/performed                                         General Comments       Exercises     Shoulder Instructions      Home Living  Family/patient expects to be discharged to:: Private residence Living Arrangements: Alone Available Help at Discharge: Family;Available PRN/intermittently Type of Home: House Home Access: Stairs to enter                     Home Equipment: Conservation officer, nature (2 wheels);Cane - single point          Prior Functioning/Environment Prior Level of Function : Independent/Modified Independent             Mobility Comments: Ind at baseline ADLs Comments: Ind at baseline        OT Problem List:        OT Treatment/Interventions:      OT Goals(Current goals can be found in the care plan section) Acute Rehab OT Goals OT Goal Formulation: All assessment and education complete, DC therapy  OT Frequency:      Co-evaluation              AM-PAC OT "6 Clicks" Daily Activity     Outcome Measure Help from another person eating meals?: None Help from another person taking care of personal grooming?: None Help from another person toileting, which includes using toliet, bedpan, or urinal?:  None Help from another person bathing (including washing, rinsing, drying)?: None Help from another person to put on and taking off regular upper body clothing?: None Help from another person to put on and taking off regular lower body clothing?: None 6 Click Score: 24   End of Session Nurse Communication:  (okay to see)  Activity Tolerance: Patient tolerated treatment well Patient left: in bed;with call bell/phone within reach;with family/visitor present  OT Visit Diagnosis: Unsteadiness on feet (R26.81)                Time: 7342-8768 OT Time Calculation (min): 11 min Charges:  OT General Charges $OT Visit: 1 Visit OT Evaluation $OT Eval Low Complexity: 1 Low  Gerry Heaphy, OTR/L [place]  Office [phone] Pager: [phone] 05/26/2021, 1:34 PM

## 2021-05-26 NOTE — Telephone Encounter (Signed)
Patient's spouse informing provider patient has been admitted to the hospital

## 2021-05-26 NOTE — Progress Notes (Addendum)
PROGRESS NOTE    Antonio Adams  DZH:[id number] DOB: [birth year] DOA: 05/23/2021 PCP: [name], MD    Brief Narrative:  76 year old male with a history of renal cell carcinoma, chronic kidney disease stage V, diabetes, hypertension, admitted to the hospital with 3 to 4-week history of persistent vomiting and diarrhea.  He is noted to be significantly dehydrated and acute kidney injury metabolic acidosis.  Etiology of diarrhea is not entirely clear.  Currently receiving supportive treatment with IV fluids.  Gastroenterology consulted.  05/26/2021: Patient was seen and examined at his bedside.  Denies any abdominal pain or nausea this morning.  Drop in hemoglobin this morning, repeat H&H.   Assessment & Plan:   Active Problems:   Type 2 diabetes mellitus with diabetic cataract (HCC)   Essential hypertension   Metastatic renal cell carcinoma (HCC)   Metabolic acidosis   Dehydration   CKD (chronic kidney disease) stage 5, GFR less than 15 ml/min (HCC)   AKI (acute kidney injury) (HCC)   BPH (benign prostatic hyperplasia)   Drug-induced colitis   Persistent diarrhea, nausea, vomiting now intermittent. -Etiology is unclear -Stool studies including C. difficile and GI pathogen panel unremarkable -He was previously on treatment for malignancy, but has not had any treatment in approximately 2 months -No other new medications -Symptoms have been present for 3 to 4 weeks -CT scan did not show any evidence of colitis, although did show liquid stool in colon GI following, appreciate consultative services. Zofran scheduled Gentle IV fluid hydration.  Improving acute kidney injury on chronic kidney disease stage IV Appears to be at his baseline creatinine 4.5 with GFR 13. -Admission creatinine 5.8 -No evidence of hydronephrosis on imaging Continue gentle IV fluid hydration.  Non-anion gap metabolic acidosis -Related to GI losses with persistent diarrhea and vomiting Completed  bicarb drip on 05/26/2021. Serum bicarb 9> 19. Continue with p.o. bicarb replacement 4 times daily x2 days.  Replete potassium while on bicarb replacement  Hypokalemia in the setting of bicarb drip DC bicarb drip on 05/26/2021. Replete potassium intravenously  Resolved hypotension -Suspect is related to volume depletion in the setting of dehydration -Sepsis has been ruled out -Overall blood pressures are improving with IV hydration -We will hold off on further antibiotics  History of hypertension -Holding amlodipine in light of low blood pressures on admission Continue to hold off oral antihypertensives  Sinus tachycardia O2 saturation 100% Continue IV fluid hydration  Prediabetes Hemoglobin A1c 5.8 on 05/23/2021. Continues insulin sliding scale, avoid hypoglycemia.  Metastatic renal cancer -Continue follow-up with Dr. Alen Blew  Hyperlipidemia -Continue statin  BPH -Continue on Flomax  DVT prophylaxis: heparin injection 5,000 Units Start: 05/23/21 2200  Code Status: Full code Family Communication: Discussed with his fiance at bedside Disposition Plan: Status is: Inpatient  Remains inpatient appropriate because: Continued work-up/management of acute kidney injury, metabolic acidosis and persistent diarrhea.         Consultants:  Gastroenterology  Procedures:    Antimicrobials:       Objective: Vitals:   05/25/21 1412 05/25/21 1900 05/26/21 0420 05/26/21 1415  BP: 101/69 130/60 115/68 131/82  Pulse: (!) 50 (!) 51 100 (!) 110  Resp: 16 16 16 20   Temp: 98 F (36.7 C) 98.7 F (37.1 C) 99.1 F (37.3 C) 98.3 F (36.8 C)  TempSrc: Oral Oral Oral Oral  SpO2: 94% 100% 100% 100%  Weight:      Height:        Intake/Output Summary (Last 24 hours) at  05/26/2021 1520 Last data filed at 05/26/2021 0525 Gross per 24 hour  Intake 1690.83 ml  Output --  Net 1690.83 ml   Filed Weights   05/23/21 0456  Weight: 84.4 kg    Examination:  General exam:  Frail-appearing no acute distress.  He is alert and oriented x3.   Respiratory system: Clear to auscultation with no rales.   Cardiovascular system: Tachycardic no rubs or gallops. Gastrointestinal system: Abdomen nontender palpation.  Bowel sounds present.   Central nervous system: Alert and oriented x3.   Extremities: No lower extremity edema bilaterally. Skin: No rashes or ulcerations noted. Psychiatry: Mood is appropriate for condition and setting.    Data Reviewed: I have personally reviewed following labs and imaging studies  CBC: Recent Labs  Lab 05/20/21 1525 05/23/21 0422 05/24/21 0341 05/26/21 0533  WBC 6.5 10.2 7.0 4.8  NEUTROABS 5.1 8.6*  --  3.4  HGB 10.3* 11.2* 10.3* 7.5*  HCT 30.8* 33.2* 31.3* 21.4*  MCV 91.5 92.0 93.7 88.8  PLT 326.0 338 299 387   Basic Metabolic Panel: Recent Labs  Lab 05/20/21 1525 05/23/21 0422 05/23/21 1636 05/24/21 0341 05/26/21 0533  NA 133* 134* 140 140 140  K 4.8 4.8 4.7 4.4 2.6*  CL 115* 117* 119* 120* 110  CO2 11* 9* 11* 9* 19*  GLUCOSE 95 114* 94 87 95  BUN 57* 74* 72* 62* 43*  CREATININE 4.67* 5.80* 5.48* 5.41* 4.59*  CALCIUM 9.5 9.2 8.8* 9.0 8.1*  PHOS  --   --  6.6*  --  3.6   GFR: Estimated Creatinine Clearance: 15.3 mL/min (A) (by C-G formula based on SCr of 4.59 mg/dL (H)). Liver Function Tests: Recent Labs  Lab 05/20/21 1525 05/23/21 0422 05/23/21 1636 05/24/21 0341 05/26/21 0533  AST 21 22  --  18  --   ALT 25 30  --  25  --   ALKPHOS 113 111  --  98  --   BILITOT 0.2 0.4  --  0.4  --   PROT 6.1 6.5  --  5.8*  --   ALBUMIN 3.4* 3.1* 2.7* 2.7* 2.0*   Recent Labs  Lab 05/23/21 0422  LIPASE 48   No results for input(s): AMMONIA in the last 168 hours. Coagulation Profile: Recent Labs  Lab 05/23/21 0422 05/24/21 0341  INR 1.2 1.2   Cardiac Enzymes: No results for input(s): CKTOTAL, CKMB, CKMBINDEX, TROPONINI in the last 168 hours. BNP (last 3 results) No results for input(s): PROBNP in the  last 8760 hours. HbA1C: Recent Labs    05/23/21 1844  HGBA1C 5.8*   CBG: Recent Labs  Lab 05/25/21 2025 05/26/21 0009 05/26/21 0419 05/26/21 0722 05/26/21 1138  GLUCAP 91 87 89 103* 112*   Lipid Profile: No results for input(s): CHOL, HDL, LDLCALC, TRIG, CHOLHDL, LDLDIRECT in the last 72 hours. Thyroid Function Tests: No results for input(s): TSH, T4TOTAL, FREET4, T3FREE, THYROIDAB in the last 72 hours. Anemia Panel: No results for input(s): VITAMINB12, FOLATE, FERRITIN, TIBC, IRON, RETICCTPCT in the last 72 hours. Sepsis Labs: Recent Labs  Lab 05/23/21 0422 05/24/21 0341  PROCALCITON  --  0.63  LATICACIDVEN 1.0  --     Recent Results (from the past 240 hour(s))  Resp Panel by RT-PCR (Flu A&B, Covid) Nasopharyngeal Swab     Status: None   Collection Time: 05/23/21  3:59 AM   Specimen: Nasopharyngeal Swab; Nasopharyngeal(NP) swabs in vial transport medium  Result Value Ref Range Status   SARS Coronavirus 2  by RT PCR NEGATIVE NEGATIVE Final    Comment: (NOTE) SARS-CoV-2 target nucleic acids are NOT DETECTED.  The SARS-CoV-2 RNA is generally detectable in upper respiratory specimens during the acute phase of infection. The lowest concentration of SARS-CoV-2 viral copies this assay can detect is 138 copies/mL. A negative result does not preclude SARS-Cov-2 infection and should not be used as the sole basis for treatment or other patient management decisions. A negative result may occur with  improper specimen collection/handling, submission of specimen other than nasopharyngeal swab, presence of viral mutation(s) within the areas targeted by this assay, and inadequate number of viral copies(<138 copies/mL). A negative result must be combined with clinical observations, patient history, and epidemiological information. The expected result is Negative.  Fact Sheet for Patients:  EntrepreneurPulse.com.au  Fact Sheet for Healthcare Providers:   IncredibleEmployment.be  This test is no t yet approved or cleared by the [place] FDA and  has been authorized for detection and/or diagnosis of SARS-CoV-2 by FDA under an Emergency Use Authorization (EUA). This EUA will remain  in effect (meaning this test can be used) for the duration of the COVID-19 declaration under Section 564(b)(1) of the Act, 21 U.S.C.section 360bbb-3(b)(1), unless the authorization is terminated  or revoked sooner.       Influenza A by PCR NEGATIVE NEGATIVE Final   Influenza B by PCR NEGATIVE NEGATIVE Final    Comment: (NOTE) The Xpert Xpress SARS-CoV-2/FLU/RSV plus assay is intended as an aid in the diagnosis of influenza from Nasopharyngeal swab specimens and should not be used as a sole basis for treatment. Nasal washings and aspirates are unacceptable for Xpert Xpress SARS-CoV-2/FLU/RSV testing.  Fact Sheet for Patients: EntrepreneurPulse.com.au  Fact Sheet for Healthcare Providers: IncredibleEmployment.be  This test is not yet approved or cleared by the [place] FDA and has been authorized for detection and/or diagnosis of SARS-CoV-2 by FDA under an Emergency Use Authorization (EUA). This EUA will remain in effect (meaning this test can be used) for the duration of the COVID-19 declaration under Section 564(b)(1) of the Act, 21 U.S.C. section 360bbb-3(b)(1), unless the authorization is terminated or revoked.  Performed at [facility], [place] [address], [place], [place] [address]   Blood culture (routine x 2)     Status: None (Preliminary result)   Collection Time: 05/23/21  4:22 AM   Specimen: BLOOD  Result Value Ref Range Status   Specimen Description   Final    BLOOD BLOOD RIGHT FOREARM Performed at [place] [address] [place], [place] [address]    Special Requests   Final    BOTTLES DRAWN AEROBIC ONLY Blood Culture adequate  volume Performed at [place] [address], [place], [place] [address]    Culture   Final    NO GROWTH 3 DAYS Performed at [facility], [place] [address] [place], [place] [address]    Report Status PENDING  Incomplete  Blood culture (routine x 2)     Status: None (Preliminary result)   Collection Time: 05/23/21  4:42 AM   Specimen: BLOOD LEFT HAND  Result Value Ref Range Status   Specimen Description   Final    BLOOD LEFT HAND Performed at [place] [address], [place],  [address]    Special Requests   Final    BOTTLES DRAWN AEROBIC AND ANAEROBIC Blood Culture results may not be optimal due to an inadequate volume of blood received in culture  bottles Performed at [place] [address], [place], [place] [address]    Culture   Final    NO GROWTH 3 DAYS Performed at [facility], [place] [address] [place], [place] [address]    Report Status PENDING  Incomplete  C Difficile Quick Screen w PCR reflex     Status: None   Collection Time: 05/23/21  5:02 AM   Specimen: STOOL  Result Value Ref Range Status   C Diff antigen NEGATIVE NEGATIVE Final   C Diff toxin NEGATIVE NEGATIVE Final   C Diff interpretation No C. difficile detected.  Final    Comment: Performed at [facility], [place] [address], [place], [place] [address]  Gastrointestinal Panel by PCR , Stool     Status: None   Collection Time: 05/23/21  5:02 AM   Specimen: STOOL  Result Value Ref Range Status   Campylobacter species NOT DETECTED NOT DETECTED Final   Plesimonas shigelloides NOT DETECTED NOT DETECTED Final   Salmonella species NOT DETECTED NOT DETECTED Final   Yersinia enterocolitica NOT DETECTED NOT DETECTED Final   Vibrio species NOT DETECTED NOT DETECTED Final   Vibrio cholerae NOT DETECTED NOT DETECTED Final   Enteroaggregative E coli (EAEC) NOT DETECTED NOT DETECTED Final   Enteropathogenic E coli  (EPEC) NOT DETECTED NOT DETECTED Final   Enterotoxigenic E coli (ETEC) NOT DETECTED NOT DETECTED Final   Shiga like toxin producing E coli (STEC) NOT DETECTED NOT DETECTED Final   Shigella/Enteroinvasive E coli (EIEC) NOT DETECTED NOT DETECTED Final   Cryptosporidium NOT DETECTED NOT DETECTED Final   Cyclospora cayetanensis NOT DETECTED NOT DETECTED Final   Entamoeba histolytica NOT DETECTED NOT DETECTED Final   Giardia lamblia NOT DETECTED NOT DETECTED Final   Adenovirus F40/41 NOT DETECTED NOT DETECTED Final   Astrovirus NOT DETECTED NOT DETECTED Final   Norovirus GI/GII NOT DETECTED NOT DETECTED Final   Rotavirus A NOT DETECTED NOT DETECTED Final   Sapovirus (I, II, IV, and V) NOT DETECTED NOT DETECTED Final    Comment: Performed at [facility], [address], [place], [place] [address]  Urine Culture     Status: None   Collection Time: 05/23/21  9:02 AM   Specimen: Urine, Clean Catch  Result Value Ref Range Status   Specimen Description   Final    URINE, CLEAN CATCH Performed at [facility], [place] [address], [place], [place] [address]    Special Requests   Final    NONE Performed at [facility], [place] [address], [place], [place] [address]    Culture   Final    NO GROWTH Performed at [facility], [place] [address] [place], [place] [address]    Report Status 05/24/2021 FINAL  Final         Radiology Studies: No results found.      Scheduled Meds:  (feeding supplement) PROSource Plus  30 mL Oral BID BM   aspirin EC  81 mg Oral Daily   atorvastatin  20 mg Oral QHS   darifenacin  7.5 mg Oral Daily   feeding supplement  1 Container Oral BID BM   heparin  5,000 Units Subcutaneous Q8H   insulin aspart  0-6 Units Subcutaneous TID WC   loperamide  2 mg Oral TID   mouth rinse  15 mL Mouth Rinse BID   metoCLOPramide (REGLAN) injection  10 mg Intravenous Q6H   multivitamin with minerals  1 tablet Oral Daily    ondansetron (ZOFRAN) IV  4 mg Intravenous Once   tamsulosin  0.4 mg Oral Daily   Continuous Infusions:  sodium bicarbonate 150 mEq in D5W infusion 50 mL/hr at 05/26/21 0525     LOS: 3 days    Time spent: 35 minutes    Kayleen Memos, MD [facility]   If 7PM-7AM, please contact night-coverage [email or web address]  05/26/2021, 3:20 PM

## 2021-05-27 DIAGNOSIS — R710 Precipitous drop in hematocrit: Secondary | ICD-10-CM | POA: Diagnosis not present

## 2021-05-27 DIAGNOSIS — N179 Acute kidney failure, unspecified: Secondary | ICD-10-CM | POA: Diagnosis not present

## 2021-05-27 DIAGNOSIS — K521 Toxic gastroenteritis and colitis: Secondary | ICD-10-CM | POA: Diagnosis not present

## 2021-05-27 LAB — RENAL FUNCTION PANEL
Albumin: 2.5 g/dL — ABNORMAL LOW (ref 3.5–5.0)
Anion gap: 12 (ref 5–15)
BUN: 39 mg/dL — ABNORMAL HIGH (ref 8–23)
CO2: 22 mmol/L (ref 22–32)
Calcium: 8.4 mg/dL — ABNORMAL LOW (ref 8.9–10.3)
Chloride: 103 mmol/L (ref 98–111)
Creatinine, Ser: 4.47 mg/dL — ABNORMAL HIGH (ref 0.61–1.24)
GFR, Estimated: 13 mL/min — ABNORMAL LOW (ref >60)
Glucose, Bld: 101 mg/dL — ABNORMAL HIGH (ref 70–99)
Phosphorus: 2.9 mg/dL (ref 2.5–4.6)
Potassium: 3.2 mmol/L — ABNORMAL LOW (ref 3.5–5.1)
Sodium: 137 mmol/L (ref 135–145)

## 2021-05-27 LAB — CBC
HCT: 21.8 % — ABNORMAL LOW (ref 39.0–52.0)
Hemoglobin: 7.5 g/dL — ABNORMAL LOW (ref 13.0–17.0)
MCH: 30.9 pg (ref 26.0–34.0)
MCHC: 34.4 g/dL (ref 30.0–36.0)
MCV: 89.7 fL (ref 80.0–100.0)
Platelets: 208 10*3/uL (ref 150–400)
RBC: 2.43 MIL/uL — ABNORMAL LOW (ref 4.22–5.81)
RDW: 15.6 % — ABNORMAL HIGH (ref 11.5–15.5)
WBC: 5.6 10*3/uL (ref 4.0–10.5)
nRBC: 0 % (ref 0.0–0.2)

## 2021-05-27 LAB — GLUCOSE, CAPILLARY
Glucose-Capillary: 79 mg/dL (ref 70–99)
Glucose-Capillary: 79 mg/dL (ref 70–99)
Glucose-Capillary: 85 mg/dL (ref 70–99)
Glucose-Capillary: 91 mg/dL (ref 70–99)
Glucose-Capillary: 95 mg/dL (ref 70–99)
Glucose-Capillary: 96 mg/dL (ref 70–99)

## 2021-05-27 LAB — MAGNESIUM: Magnesium: 1.7 mg/dL (ref 1.7–2.4)

## 2021-05-27 MED ORDER — POTASSIUM CHLORIDE 10 MEQ/100ML IV SOLN
10.0000 meq | INTRAVENOUS | Status: AC
  Administered 2021-05-27 (×5): 10 meq via INTRAVENOUS
  Filled 2021-05-27 (×5): qty 100

## 2021-05-27 NOTE — Consult Note (Signed)
[facility] Inpatient Consult   05/27/2021  Antonio Adams 1945-08-11 [id number]  [facility] CM)   Patient chart screened due to extreme high risk score for unplanned readmission. Per review, patient is active with chronic care management (CCM) team at primary provider office. Chronic care team provides chronic disease management assistance and care coordination services.  Plan: Will continue to follow for progression and disposition plans.  Of note, [facility] Care Management services does not replace or interfere with any services that are arranged by inpatient case management or social work.   Netta Cedars, MSN, RN [facility] [employer] [phone]  Toll free office [phone]

## 2021-05-27 NOTE — Progress Notes (Signed)
Physical Therapy Treatment Patient Details Name: Antonio Adams MRN: [id number] DOB: [birth year] Today's Date: 05/27/2021   History of Present Illness 76 yo male admitted with metabolic acidosis, N/V/D.    PT Comments    Pt agreeable to mobilize. Pt a little unsteady and reaching for furniture in room so provided RW for ambulating in hallway.  Pt would benefit from ambulating with staff during remainder of hospitalization in addition to PT.    Recommendations for follow up therapy are one component of a multi-disciplinary discharge planning process, led by the attending physician.  Recommendations may be updated based on patient status, additional functional criteria and insurance authorization.  Follow Up Recommendations  Home health PT     Assistance Recommended at Discharge    Patient can return home with the following A little help with walking and/or transfers;A little help with bathing/dressing/bathroom   Equipment Recommendations  Rolling walker (2 wheels)    Recommendations for Other Services       Precautions / Restrictions Precautions Precautions: None Restrictions Weight Bearing Restrictions: No     Mobility  Bed Mobility Overal bed mobility: Independent             General bed mobility comments: pt laying on window seat on arrival    Transfers Overall transfer level: Needs assistance Equipment used: None Transfers: Sit to/from Stand Sit to Stand: Min guard           General transfer comment: min/guard for safety, pt using UEs for self assist    Ambulation/Gait Ambulation/Gait assistance: Min guard Gait Distance (Feet): 150 Feet Assistive device: Rolling walker (2 wheels) Gait Pattern/deviations: Step-through pattern;Decreased stride length       General Gait Details: pt reaching for objects in room so provided RW for improved stability in hallway, slow but steady with use of RW, distance per pt preference, HR 115 bpm   Stairs              Wheelchair Mobility    Modified Rankin (Stroke Patients Only)       Balance                                            Cognition Arousal/Alertness: Awake/alert Behavior During Therapy: WFL for tasks assessed/performed Overall Cognitive Status: Within Functional Limits for tasks assessed                                          Exercises      General Comments        Pertinent Vitals/Pain Pain Assessment: No/denies pain    Home Living                          Prior Function            PT Goals (current goals can now be found in the care plan section) Progress towards PT goals: Progressing toward goals    Frequency    Min 3X/week      PT Plan Current plan remains appropriate    Co-evaluation              AM-PAC PT "6 Clicks" Mobility   Outcome Measure  Help needed turning from your back to your side while in  a flat bed without using bedrails?: None Help needed moving from lying on your back to sitting on the side of a flat bed without using bedrails?: None Help needed moving to and from a bed to a chair (including a wheelchair)?: A Little Help needed standing up from a chair using your arms (e.g., wheelchair or bedside chair)?: A Little Help needed to walk in hospital room?: A Little Help needed climbing 3-5 steps with a railing? : A Little 6 Click Score: 20    End of Session Equipment Utilized During Treatment: Gait belt Activity Tolerance: Patient tolerated treatment well Patient left: with call bell/phone within reach;with family/visitor present (back on window seat, student RN in room)   PT Visit Diagnosis: Unsteadiness on feet (R26.81);Difficulty in walking, not elsewhere classified (R26.2)     Time: 6734-1937 PT Time Calculation (min) (ACUTE ONLY): 10 min  Charges:  $Gait Training: 8-22 mins                     Arlyce Dice, DPT Acute Rehabilitation Services Pager:  [phone] Office: [place] 05/27/2021, 3:30 PM

## 2021-05-27 NOTE — Progress Notes (Addendum)
Progress Note   Subjective  Chief Complaint: Nausea, vomiting, diarrhea  Patient sitting in window seat with fianc. States he is feeling better but still overall weak, physical therapy did work with him yesterday. Patient states his nausea has improved with Reglan, had blueberry muffin this morning without any vomiting.  Patient's had 1 stool early this morning nothing overnight, yellow liquid, denies abdominal pain, fever, chills.  No evidence of bleeding, hemoglobin stable 7.8, potassium improved but still low at 3.2    Objective   Vital signs in last 24 hours: Temp:  [98.3 F (36.8 C)-98.7 F (37.1 C)] 98.7 F (37.1 C) (01/13 0410) Pulse Rate:  [100-110] 100 (01/13 0410) Resp:  [20] 20 (01/12 2018) BP: (105-131)/(52-82) 105/52 (01/13 0410) SpO2:  [99 %-100 %] 99 % (01/13 0410) Last BM Date: 05/26/21  General:   male in no acute distress  Heart:  regular rate and rhythm, no murmurs or gallops Pulm: Clear anteriorly; no wheezing Abdomen:  Soft, Obese AB, Normal bowel sounds.  No  tenderness  Extremities:  Without edema. Neurologic:  Alert and  oriented x4;  grossly normal neurologically.  Skin:   Dry and intact without significant lesions or rashes. Psychiatric: Cooperative. Normal mood and affect.  Intake/Output from previous day: 01/12 0701 - 01/13 0700 In: 240 [P.O.:240] Out: -  Intake/Output this shift: No intake/output data recorded.  Lab Results: Recent Labs    05/26/21 0533 05/26/21 1549  WBC 4.8  --   HGB 7.5* 7.8*  HCT 21.4* 22.7*  PLT 196  --    BMET Recent Labs    05/26/21 0533 05/26/21 1549 05/27/21 0344  NA 140  --  137  K 2.6* 3.1* 3.2*  CL 110  --  103  CO2 19*  --  22  GLUCOSE 95  --  101*  BUN 43*  --  39*  CREATININE 4.59*  --  4.47*  CALCIUM 8.1*  --  8.4*   LFT Recent Labs    05/27/21 0344  ALBUMIN 2.5*      Impression/Plan:   76 yo male admitted 05/23/21 with three week history of nausea, vomiting and diarrhea.  Admitted with dehydration / shock (probably hypovolemic), AKI on CKD.  Query Nivolumab induced colitis? No clear etiology for nausea /vomiting at this point.      --GI path panel negative. C-diff negative.  -- Fluid filled colon on non-contrast CTAP. No leukocytosis. Blood cx negative so far.  -- Patient states symptoms are improving since admission, had 1 loose stool this morning nothing overnight, nausea has improved with scheduled Zofran and Reglan.   -- I don't think there is an immediate need for further GI workup.  - continue with PT  Remote history of small duodenal carcinoid resected at [facility]. Negative EUS at [facility] in 2018.    CKD on AKI:  Improving slowly   Metastatic right RCC, most recently treated with Nivolumab (last in November).   Hypokalemia:  K+ 3.2 this AM. -Replacement per primary team. - check magnesium   Acute on chronic anemia Baseline appears to be around 9 Was 10.3 g went down to 7.5 without any overt bleeding, patient was still getting IVF's Today 7.8 still without any signs of bleeding.   LOS: 4 days   Vladimir Crofts  05/27/2021, 11:39 AM     [facility] Attending   I have taken an interval history, reviewed the chart and examined the patient. I agree with the Advanced Practitioner's  note, impression and recommendations.  Majority the medical decision-making in the formulation of the assessment and plan were performed by me.  Slow improvement in patient w/ checkpoint inhibitor toxicity and metastatic renal cell carcinoma. Continue supportive care.  Gatha Mayer, MD, [facility] 05/27/2021 2:32 PM

## 2021-05-27 NOTE — Progress Notes (Signed)
PROGRESS NOTE    Antonio Adams  BJS:[id number] DOB: [birth year] DOA: 05/23/2021 PCP: [name], MD    Brief Narrative:  76 year old male with a history of renal cell carcinoma, chronic kidney disease stage V, diabetes, hypertension, admitted to the hospital with 3 to 4-week history of persistent vomiting and diarrhea.  He is noted to be significantly dehydrated and acute kidney injury metabolic acidosis.  Etiology of diarrhea is not entirely clear.  Currently receiving supportive treatment with IV fluids.  Gastroenterology consulted.  05/27/2021: Patient was seen and examined at bedside.  Reports nausea is improved but he is on IV Reglan scheduled.  Denies any abdominal pain.  Diarrhea is improving on scheduled Imodium.  Management per GI guidance.  Diet advanced to soft.   Assessment & Plan:   Active Problems:   Type 2 diabetes mellitus with diabetic cataract (HCC)   Essential hypertension   Metastatic renal cell carcinoma (HCC)   Metabolic acidosis   Dehydration   CKD (chronic kidney disease) stage 5, GFR less than 15 ml/min (HCC)   AKI (acute kidney injury) (HCC)   BPH (benign prostatic hyperplasia)   Drug-induced colitis   Improving diarrhea, nausea, vomiting now intermittent. -Etiology is unclear -Stool studies including C. difficile and GI pathogen panel unremarkable -He was previously on treatment for malignancy, but has not had any treatment in approximately 2 months -No other new medications -Symptoms have been present for 3 to 4 weeks -CT scan did not show any evidence of colitis, although did show liquid stool in colon GI following, appreciate consultative services. Zofran scheduled Not on IV fluid on 05/27/2021. Currently on scheduled IV Reglan and scheduled Imodium. Defer duration of Reglan therapy and scheduled Imodium to GI.  Improving acute kidney injury on chronic kidney disease stage IV Appears to be at his baseline creatinine 4.5 with GFR  13. -Admission creatinine 5.8 -No evidence of hydronephrosis on imaging Continue gentle IV fluid hydration.  Resolved non-anion gap metabolic acidosis -Related to GI losses with persistent diarrhea and vomiting Completed bicarb drip on 05/26/2021. Serum bicarb 9> 19. Continue with p.o. bicarb replacement 4 times daily x2 days.  Replete potassium while on bicarb replacement  Refractory hypokalemia in the setting of bicarb drip DC bicarb drip on 05/26/2021. Replete potassium intravenously  Resolved hypotension -Suspect is related to volume depletion in the setting of dehydration -Sepsis has been ruled out -Overall blood pressures are improving with IV hydration -We will hold off on further antibiotics  History of hypertension -Holding amlodipine in light of low blood pressures on admission Continue to hold off oral antihypertensives  Sinus tachycardia O2 saturation 100% Continue IV fluid hydration  Prediabetes Hemoglobin A1c 5.8 on 05/23/2021. Continues insulin sliding scale, avoid hypoglycemia.  Metastatic renal cancer -Continue follow-up with Dr. Alen Blew  Hyperlipidemia -Continue statin  BPH -Continue on Flomax  DVT prophylaxis: heparin injection 5,000 Units Start: 05/23/21 2200  Code Status: Full code Family Communication: Discussed with his fiance at bedside Disposition Plan: Status is: Inpatient  Remains inpatient appropriate because: Continued work-up/management of acute kidney injury, metabolic acidosis and persistent diarrhea.         Consultants:  Gastroenterology  Procedures:    Antimicrobials:       Objective: Vitals:   05/26/21 2018 05/27/21 0410 05/27/21 1200 05/27/21 1232  BP: 126/67 (!) 105/52  110/68  Pulse: (!) 101 100  87  Resp: 20   17  Temp: 98.7 F (37.1 C) 98.7 F (37.1 C)  98.3 F (36.8  C)  TempSrc: Oral Oral  Oral  SpO2: 99% 99% 98%   Weight:      Height:        Intake/Output Summary (Last 24 hours) at 05/27/2021  1419 Last data filed at 05/27/2021 1223 Gross per 24 hour  Intake 340 ml  Output --  Net 340 ml   Filed Weights   05/23/21 0456  Weight: 84.4 kg    Examination:  General exam: Frail-appearing alert and oriented x3.  Respiratory system: Clear to auscultation with no rales.   Cardiovascular system: Tachycardic no rubs or gallops. Gastrointestinal system: No abdominal pain.  Bowel sounds present.   Central nervous system: He is alert and oriented x3.  He moves all 4 extremities.  Nonfocal exam. Extremities: No lower extremity edema bilaterally. Skin: No rashes or ulcerations noted. Psychiatry: His mood is appropriate for condition and setting.    Data Reviewed: I have personally reviewed following labs and imaging studies  CBC: Recent Labs  Lab 05/20/21 1525 05/23/21 0422 05/24/21 0341 05/26/21 0533 05/26/21 1549 05/27/21 1317  WBC 6.5 10.2 7.0 4.8  --  5.6  NEUTROABS 5.1 8.6*  --  3.4  --   --   HGB 10.3* 11.2* 10.3* 7.5* 7.8* 7.5*  HCT 30.8* 33.2* 31.3* 21.4* 22.7* 21.8*  MCV 91.5 92.0 93.7 88.8  --  89.7  PLT 326.0 338 299 196  --  220   Basic Metabolic Panel: Recent Labs  Lab 05/23/21 0422 05/23/21 1636 05/24/21 0341 05/26/21 0533 05/26/21 1549 05/27/21 0344  NA 134* 140 140 140  --  137  K 4.8 4.7 4.4 2.6* 3.1* 3.2*  CL 117* 119* 120* 110  --  103  CO2 9* 11* 9* 19*  --  22  GLUCOSE 114* 94 87 95  --  101*  BUN 74* 72* 62* 43*  --  39*  CREATININE 5.80* 5.48* 5.41* 4.59*  --  4.47*  CALCIUM 9.2 8.8* 9.0 8.1*  --  8.4*  MG  --   --   --   --   --  1.7  PHOS  --  6.6*  --  3.6  --  2.9   GFR: Estimated Creatinine Clearance: 15.7 mL/min (A) (by C-G formula based on SCr of 4.47 mg/dL (H)). Liver Function Tests: Recent Labs  Lab 05/20/21 1525 05/23/21 0422 05/23/21 1636 05/24/21 0341 05/26/21 0533 05/27/21 0344  AST 21 22  --  18  --   --   ALT 25 30  --  25  --   --   ALKPHOS 113 111  --  98  --   --   BILITOT 0.2 0.4  --  0.4  --   --   PROT  6.1 6.5  --  5.8*  --   --   ALBUMIN 3.4* 3.1* 2.7* 2.7* 2.0* 2.5*   Recent Labs  Lab 05/23/21 0422  LIPASE 48   No results for input(s): AMMONIA in the last 168 hours. Coagulation Profile: Recent Labs  Lab 05/23/21 0422 05/24/21 0341  INR 1.2 1.2   Cardiac Enzymes: No results for input(s): CKTOTAL, CKMB, CKMBINDEX, TROPONINI in the last 168 hours. BNP (last 3 results) No results for input(s): PROBNP in the last 8760 hours. HbA1C: No results for input(s): HGBA1C in the last 72 hours.  CBG: Recent Labs  Lab 05/26/21 2016 05/26/21 2345 05/27/21 0408 05/27/21 0713 05/27/21 1110  GLUCAP 81 82 95 79 96   Lipid Profile: No results for input(s):  CHOL, HDL, LDLCALC, TRIG, CHOLHDL, LDLDIRECT in the last 72 hours. Thyroid Function Tests: No results for input(s): TSH, T4TOTAL, FREET4, T3FREE, THYROIDAB in the last 72 hours. Anemia Panel: No results for input(s): VITAMINB12, FOLATE, FERRITIN, TIBC, IRON, RETICCTPCT in the last 72 hours. Sepsis Labs: Recent Labs  Lab 05/23/21 0422 05/24/21 0341  PROCALCITON  --  0.63  LATICACIDVEN 1.0  --     Recent Results (from the past 240 hour(s))  Resp Panel by RT-PCR (Flu A&B, Covid) Nasopharyngeal Swab     Status: None   Collection Time: 05/23/21  3:59 AM   Specimen: Nasopharyngeal Swab; Nasopharyngeal(NP) swabs in vial transport medium  Result Value Ref Range Status   SARS Coronavirus 2 by RT PCR NEGATIVE NEGATIVE Final    Comment: (NOTE) SARS-CoV-2 target nucleic acids are NOT DETECTED.  The SARS-CoV-2 RNA is generally detectable in upper respiratory specimens during the acute phase of infection. The lowest concentration of SARS-CoV-2 viral copies this assay can detect is 138 copies/mL. A negative result does not preclude SARS-Cov-2 infection and should not be used as the sole basis for treatment or other patient management decisions. A negative result may occur with  improper specimen collection/handling, submission of  specimen other than nasopharyngeal swab, presence of viral mutation(s) within the areas targeted by this assay, and inadequate number of viral copies(<138 copies/mL). A negative result must be combined with clinical observations, patient history, and epidemiological information. The expected result is Negative.  Fact Sheet for Patients:  EntrepreneurPulse.com.au  Fact Sheet for Healthcare Providers:  IncredibleEmployment.be  This test is no t yet approved or cleared by the [place] FDA and  has been authorized for detection and/or diagnosis of SARS-CoV-2 by FDA under an Emergency Use Authorization (EUA). This EUA will remain  in effect (meaning this test can be used) for the duration of the COVID-19 declaration under Section 564(b)(1) of the Act, 21 U.S.C.section 360bbb-3(b)(1), unless the authorization is terminated  or revoked sooner.       Influenza A by PCR NEGATIVE NEGATIVE Final   Influenza B by PCR NEGATIVE NEGATIVE Final    Comment: (NOTE) The Xpert Xpress SARS-CoV-2/FLU/RSV plus assay is intended as an aid in the diagnosis of influenza from Nasopharyngeal swab specimens and should not be used as a sole basis for treatment. Nasal washings and aspirates are unacceptable for Xpert Xpress SARS-CoV-2/FLU/RSV testing.  Fact Sheet for Patients: EntrepreneurPulse.com.au  Fact Sheet for Healthcare Providers: IncredibleEmployment.be  This test is not yet approved or cleared by the [place] FDA and has been authorized for detection and/or diagnosis of SARS-CoV-2 by FDA under an Emergency Use Authorization (EUA). This EUA will remain in effect (meaning this test can be used) for the duration of the COVID-19 declaration under Section 564(b)(1) of the Act, 21 U.S.C. section 360bbb-3(b)(1), unless the authorization is terminated or revoked.  Performed at [facility], [place]  [address] [place], [place] [address]   Blood culture (routine x 2)     Status: None (Preliminary result)   Collection Time: 05/23/21  4:22 AM   Specimen: BLOOD  Result Value Ref Range Status   Specimen Description   Final    BLOOD BLOOD RIGHT FOREARM Performed at [facility] [address], [place], [place] [address]    Special Requests   Final    BOTTLES DRAWN AEROBIC ONLY Blood Culture adequate volume Performed at [facility] [address], [place], [place] [address]    Culture  Final    NO GROWTH 4 DAYS Performed at [facility], [place] [address] [place], [place] [address]    Report Status PENDING  Incomplete  Blood culture (routine x 2)     Status: None (Preliminary result)   Collection Time: 05/23/21  4:42 AM   Specimen: BLOOD LEFT HAND  Result Value Ref Range Status   Specimen Description   Final    BLOOD LEFT HAND Performed at [facility] [address], [place], [place] [address]    Special Requests   Final    BOTTLES DRAWN AEROBIC AND ANAEROBIC Blood Culture results may not be optimal due to an inadequate volume of blood received in culture bottles Performed at [facility] [address], [place], [place] [address]    Culture   Final    NO GROWTH 4 DAYS Performed at [facility], [place] [address] [place], [place] [address]    Report Status PENDING  Incomplete  C Difficile Quick Screen w PCR reflex     Status: None   Collection Time: 05/23/21  5:02 AM   Specimen: STOOL  Result Value Ref Range Status   C Diff antigen NEGATIVE NEGATIVE Final   C Diff toxin NEGATIVE NEGATIVE Final   C Diff interpretation No C. difficile detected.  Final    Comment: Performed at [facility], [place] [address], [place], [place] [address]  Gastrointestinal Panel by PCR , Stool     Status: None   Collection Time: 05/23/21  5:02 AM   Specimen: STOOL  Result Value Ref Range  Status   Campylobacter species NOT DETECTED NOT DETECTED Final   Plesimonas shigelloides NOT DETECTED NOT DETECTED Final   Salmonella species NOT DETECTED NOT DETECTED Final   Yersinia enterocolitica NOT DETECTED NOT DETECTED Final   Vibrio species NOT DETECTED NOT DETECTED Final   Vibrio cholerae NOT DETECTED NOT DETECTED Final   Enteroaggregative E coli (EAEC) NOT DETECTED NOT DETECTED Final   Enteropathogenic E coli (EPEC) NOT DETECTED NOT DETECTED Final   Enterotoxigenic E coli (ETEC) NOT DETECTED NOT DETECTED Final   Shiga like toxin producing E coli (STEC) NOT DETECTED NOT DETECTED Final   Shigella/Enteroinvasive E coli (EIEC) NOT DETECTED NOT DETECTED Final   Cryptosporidium NOT DETECTED NOT DETECTED Final   Cyclospora cayetanensis NOT DETECTED NOT DETECTED Final   Entamoeba histolytica NOT DETECTED NOT DETECTED Final   Giardia lamblia NOT DETECTED NOT DETECTED Final   Adenovirus F40/41 NOT DETECTED NOT DETECTED Final   Astrovirus NOT DETECTED NOT DETECTED Final   Norovirus GI/GII NOT DETECTED NOT DETECTED Final   Rotavirus A NOT DETECTED NOT DETECTED Final   Sapovirus (I, II, IV, and V) NOT DETECTED NOT DETECTED Final    Comment: Performed at [facility], [address] [place], [place] [address]  Urine Culture     Status: None   Collection Time: 05/23/21  9:02 AM   Specimen: Urine, Clean Catch  Result Value Ref Range Status   Specimen Description   Final    URINE, CLEAN CATCH Performed at [facility], [place] [address], [place], [place] [address]    Special Requests   Final    NONE Performed at [facility], [place] [address], [place], [place] [address]    Culture   Final    NO GROWTH Performed at [facility], [place] [address] [place], [place] [address]    Report Status  05/24/2021 FINAL  Final         Radiology Studies: No results found.      Scheduled Meds:  (feeding supplement) PROSource Plus  30 mL  Oral BID BM   aspirin EC  81 mg Oral Daily   atorvastatin  20 mg Oral QHS   darifenacin  7.5 mg Oral Daily   feeding supplement  1 Container Oral BID BM   heparin  5,000 Units Subcutaneous Q8H   insulin aspart  0-6 Units Subcutaneous TID WC   loperamide  2 mg Oral TID   mouth rinse  15 mL Mouth Rinse BID   metoCLOPramide (REGLAN) injection  10 mg Intravenous Q6H   multivitamin with minerals  1 tablet Oral Daily   ondansetron (ZOFRAN) IV  4 mg Intravenous Once   sodium bicarbonate  1,300 mg Oral QID   tamsulosin  0.4 mg Oral Daily   Continuous Infusions:     LOS: 4 days    Time spent: 35 minutes    Kayleen Memos, MD [facility]   If 7PM-7AM, please contact night-coverage [email or web address]  05/27/2021, 2:19 PM

## 2021-05-28 DIAGNOSIS — R710 Precipitous drop in hematocrit: Secondary | ICD-10-CM | POA: Diagnosis not present

## 2021-05-28 DIAGNOSIS — N179 Acute kidney failure, unspecified: Secondary | ICD-10-CM | POA: Diagnosis not present

## 2021-05-28 DIAGNOSIS — R112 Nausea with vomiting, unspecified: Secondary | ICD-10-CM

## 2021-05-28 DIAGNOSIS — K521 Toxic gastroenteritis and colitis: Secondary | ICD-10-CM | POA: Diagnosis not present

## 2021-05-28 LAB — CULTURE, BLOOD (ROUTINE X 2)
Culture: NO GROWTH
Culture: NO GROWTH
Special Requests: ADEQUATE

## 2021-05-28 LAB — PREPARE RBC (CROSSMATCH)

## 2021-05-28 LAB — TYPE AND SCREEN
ABO/RH(D): O POS
Antibody Screen: NEGATIVE
Donor AG Type: NEGATIVE
Unit division: 0

## 2021-05-28 LAB — RENAL FUNCTION PANEL
Albumin: 2.1 g/dL — ABNORMAL LOW (ref 3.5–5.0)
Anion gap: 8 (ref 5–15)
BUN: 35 mg/dL — ABNORMAL HIGH (ref 8–23)
CO2: 23 mmol/L (ref 22–32)
Calcium: 8.4 mg/dL — ABNORMAL LOW (ref 8.9–10.3)
Chloride: 107 mmol/L (ref 98–111)
Creatinine, Ser: 4.29 mg/dL — ABNORMAL HIGH (ref 0.61–1.24)
GFR, Estimated: 14 mL/min — ABNORMAL LOW (ref >60)
Glucose, Bld: 100 mg/dL — ABNORMAL HIGH (ref 70–99)
Phosphorus: 3 mg/dL (ref 2.5–4.6)
Potassium: 3 mmol/L — ABNORMAL LOW (ref 3.5–5.1)
Sodium: 138 mmol/L (ref 135–145)

## 2021-05-28 LAB — GLUCOSE, CAPILLARY
Glucose-Capillary: 98 mg/dL (ref 70–99)
Glucose-Capillary: 90 mg/dL (ref 70–99)
Glucose-Capillary: 86 mg/dL (ref 70–99)
Glucose-Capillary: 111 mg/dL — ABNORMAL HIGH (ref 70–99)
Glucose-Capillary: 93 mg/dL (ref 70–99)

## 2021-05-28 LAB — BPAM RBC
Blood Product Expiration Date: 202302012359
ISSUE DATE / TIME: 202301141615
Unit Type and Rh: 5100

## 2021-05-28 MED ORDER — VITAMIN D 25 MCG (1000 UNIT) PO TABS
1000.0000 [IU] | ORAL_TABLET | Freq: Every day | ORAL | Status: DC
  Administered 2021-05-28 – 2021-05-29 (×2): 1000 [IU] via ORAL
  Filled 2021-05-28 (×2): qty 1

## 2021-05-28 MED ORDER — POTASSIUM CHLORIDE CRYS ER 20 MEQ PO TBCR
40.0000 meq | EXTENDED_RELEASE_TABLET | Freq: Two times a day (BID) | ORAL | Status: DC
  Administered 2021-05-28 – 2021-05-29 (×3): 40 meq via ORAL
  Filled 2021-05-28 (×3): qty 2

## 2021-05-28 MED ORDER — METOCLOPRAMIDE HCL 10 MG PO TABS
10.0000 mg | ORAL_TABLET | Freq: Three times a day (TID) | ORAL | Status: DC
  Administered 2021-05-28 – 2021-05-29 (×4): 10 mg via ORAL
  Filled 2021-05-28 (×4): qty 1

## 2021-05-28 MED ORDER — GEMFIBROZIL 600 MG PO TABS
600.0000 mg | ORAL_TABLET | Freq: Two times a day (BID) | ORAL | Status: DC
  Administered 2021-05-28 – 2021-05-29 (×2): 600 mg via ORAL
  Filled 2021-05-28 (×2): qty 1

## 2021-05-28 MED ORDER — SODIUM CHLORIDE 0.9% IV SOLUTION: Freq: Once | INTRAVENOUS | Status: AC

## 2021-05-28 MED ORDER — GABAPENTIN 100 MG PO CAPS
100.0000 mg | ORAL_CAPSULE | Freq: Every day | ORAL | Status: DC
  Administered 2021-05-28: 100 mg via ORAL
  Filled 2021-05-28: qty 1

## 2021-05-28 MED ORDER — LOPERAMIDE HCL 2 MG PO CAPS
2.0000 mg | ORAL_CAPSULE | ORAL | Status: DC | PRN
  Administered 2021-05-28: 2 mg via ORAL

## 2021-05-28 MED ORDER — FERROUS SULFATE 325 (65 FE) MG PO TABS
325.0000 mg | ORAL_TABLET | Freq: Two times a day (BID) | ORAL | Status: DC
  Administered 2021-05-28 – 2021-05-29 (×2): 325 mg via ORAL
  Filled 2021-05-28 (×2): qty 1

## 2021-05-28 NOTE — Progress Notes (Addendum)
° °  Patient Name: Antonio Adams Date of Encounter: 05/28/2021, 9:14 AM    Subjective  Still w/ nausea, just awakening No stool recorded since yesterday Has not attempted breakfast yet   Objective  BP 114/72 (BP Location: Right Arm)    Pulse 91    Temp 99 F (37.2 C) (Oral)    Resp 16    Ht 6' (1.829 m)    Wt 84.4 kg    SpO2 100%    BMI 25.23 kg/m  Chronically ill NAD Abd soft and NT BS +  Recent Labs  Lab 05/23/21 1636 05/24/21 0341 05/26/21 0533 05/26/21 1549 05/27/21 0344 05/28/21 0425  NA 140 140 140  --  137 138  K 4.7 4.4 2.6*   < > 3.2* 3.0*  CL 119* 120* 110  --  103 107  CO2 11* 9* 19*  --  22 23  GLUCOSE 94 87 95  --  101* 100*  BUN 72* 62* 43*  --  39* 35*  CREATININE 5.48* 5.41* 4.59*  --  4.47* 4.29*  CALCIUM 8.8* 9.0 8.1*  --  8.4* 8.4*  MG  --   --   --   --  1.7  --   PHOS 6.6*  --  3.6  --  2.9 3.0   < > = values in this interval not displayed.    CBC Latest Ref Rng & Units 05/27/2021 05/26/2021 05/26/2021  WBC 4.0 - 10.5 K/uL 5.6 - 4.8  Hemoglobin 13.0 - 17.0 g/dL 7.5(L) 7.8(L) 7.5(L)  Hematocrit 39.0 - 52.0 % 21.8(L) 22.7(L) 21.4(L)  Platelets 150 - 400 K/uL 208 - 196         Assessment and Plan  Colitis from checkpoint inhibitor for renal cell carcinoma Dehydration Metabolic acidosis AKI on CKD Nausea Hypokalemia Malnutrition Decreased Hgb no bleeding DM-II  He is slowly progressing Continue supportive care Change to po Reglan F/u CBC tomorrow - he would likely benefit from transfusion if persistent Hgb in 7's Change loperamide from tid to prn D/w Dr. Siri Cole, MD, [facility] 05/28/2021 9:14 AM

## 2021-05-28 NOTE — Progress Notes (Addendum)
PROGRESS NOTE    Antonio Adams  EXH:[id number] DOB: December 19, 1945 DOA: 05/23/2021 PCP: [name], MD    Brief Narrative:  76 year old male with a history of renal cell carcinoma, chronic kidney disease stage V, diabetes, hypertension, admitted to the hospital with 3 to 4-week history of persistent vomiting and diarrhea.  He is noted to be significantly dehydrated and acute kidney injury metabolic acidosis.  Etiology of diarrhea is not entirely clear.  Currently receiving supportive treatment with IV fluids.  Gastroenterology consulted.  Ongoing scheduled IV Reglan.   05/28/2021: Patient was seen and examined at his bedside.  Reports mild nausea this morning with no vomiting.  His diarrhea has improved.  Denies any abdominal pain.  Assessment & Plan:   Active Problems:   Type 2 diabetes mellitus with diabetic cataract (HCC)   Essential hypertension   Metastatic renal cell carcinoma (HCC)   Metabolic acidosis   Dehydration   CKD (chronic kidney disease) stage 5, GFR less than 15 ml/min (HCC)   AKI (acute kidney injury) (HCC)   BPH (benign prostatic hyperplasia)   Drug-induced colitis   Decreased hemoglobin   Improving diarrhea, nausea, vomiting now intermittent. -Etiology is unclear -Stool studies including C. difficile and GI pathogen panel unremarkable -He was previously on treatment for malignancy, but has not had any treatment in approximately 2 months -No other new medications -Symptoms have been present for 3 to 4 weeks -CT scan did not show any evidence of colitis, although did show liquid stool in colon GI following, appreciate consultative services. Zofran scheduled due to persistent mild nausea. Not on IV fluid on 05/27/2021. Currently on scheduled IV Reglan and scheduled Imodium. Defer duration of Reglan therapy and scheduled Imodium to GI.  Improving acute kidney injury on chronic kidney disease stage IV Appears to be at his baseline creatinine 4.5 with GFR  13. -Admission creatinine 5.8 -No evidence of hydronephrosis on imaging Continue gentle IV fluid hydration.  Symptomatic anemia with hemoglobin 7.5 Was transfused 1 unit on 05/28/2021 for symptomatic anemia Repeat CBC posttransfusion  Refractory hypokalemia Serum potassium 3.0 Repleted orally Repeat BMP in 1  Resolved non-anion gap metabolic acidosis -Related to GI losses with persistent diarrhea and vomiting Completed bicarb drip on 05/26/2021. Serum bicarb 9> 19. Continue with p.o. bicarb replacement 4 times daily x2 days.  Replete potassium while on bicarb replacement  Refractory hypokalemia in the setting of bicarb drip DC bicarb drip on 05/26/2021. Replete potassium intravenously  Resolved hypotension -Suspect is related to volume depletion in the setting of dehydration -Sepsis has been ruled out -Overall blood pressures are improving with IV hydration -We will hold off on further antibiotics  History of hypertension -Holding amlodipine in light of low blood pressures on admission Continue to hold off oral antihypertensives  Sinus tachycardia O2 saturation 100% Continue IV fluid hydration  Prediabetes Hemoglobin A1c 5.8 on 05/23/2021. Continues insulin sliding scale, avoid hypoglycemia.  Metastatic renal cancer -Continue follow-up with Dr. Alen Blew  Hyperlipidemia -Continue statin  BPH -Continue on Flomax  DVT prophylaxis: heparin injection 5,000 Units Start: 05/23/21 2200  Code Status: Full code Family Communication: Discussed with his fiance at bedside Disposition Plan: Status is: Inpatient  Remains inpatient appropriate because: Continued work-up/management of acute kidney injury, metabolic acidosis and persistent diarrhea.         Consultants:  Gastroenterology  Procedures:    Antimicrobials:       Objective: Vitals:   05/27/21 1200 05/27/21 1232 05/27/21 2026 05/28/21 0438  BP:  110/68  118/76 114/72  Pulse:  87 76 91  Resp:  17 15  16   Temp:  98.3 F (36.8 C) 99.1 F (37.3 C) 99 F (37.2 C)  TempSrc:  Oral Oral Oral  SpO2: 98%  98% 100%  Weight:      Height:        Intake/Output Summary (Last 24 hours) at 05/28/2021 1114 Last data filed at 05/27/2021 1835 Gross per 24 hour  Intake 663.92 ml  Output --  Net 663.92 ml   Filed Weights   05/23/21 0456  Weight: 84.4 kg    Examination:  General exam: Frail-appearing alert and oriented x3.  Respiratory system: Clear to auscultation with no rales.   Cardiovascular system: Tachycardic no rubs or gallops. Gastrointestinal system: No abdominal pain.  Bowel sounds present.   Central nervous system: He is alert and oriented x3.  He moves all 4 extremities.  Nonfocal exam. Extremities: No lower extremity edema bilaterally. Skin: No rashes or ulcerations noted. Psychiatry: His mood is appropriate for condition and setting.    Data Reviewed: I have personally reviewed following labs and imaging studies  CBC: Recent Labs  Lab 05/23/21 0422 05/24/21 0341 05/26/21 0533 05/26/21 1549 05/27/21 1317  WBC 10.2 7.0 4.8  --  5.6  NEUTROABS 8.6*  --  3.4  --   --   HGB 11.2* 10.3* 7.5* 7.8* 7.5*  HCT 33.2* 31.3* 21.4* 22.7* 21.8*  MCV 92.0 93.7 88.8  --  89.7  PLT 338 299 196  --  993   Basic Metabolic Panel: Recent Labs  Lab 05/23/21 1636 05/24/21 0341 05/26/21 0533 05/26/21 1549 05/27/21 0344 05/28/21 0425  NA 140 140 140  --  137 138  K 4.7 4.4 2.6* 3.1* 3.2* 3.0*  CL 119* 120* 110  --  103 107  CO2 11* 9* 19*  --  22 23  GLUCOSE 94 87 95  --  101* 100*  BUN 72* 62* 43*  --  39* 35*  CREATININE 5.48* 5.41* 4.59*  --  4.47* 4.29*  CALCIUM 8.8* 9.0 8.1*  --  8.4* 8.4*  MG  --   --   --   --  1.7  --   PHOS 6.6*  --  3.6  --  2.9 3.0   GFR: Estimated Creatinine Clearance: 16.3 mL/min (A) (by C-G formula based on SCr of 4.29 mg/dL (H)). Liver Function Tests: Recent Labs  Lab 05/23/21 0422 05/23/21 1636 05/24/21 0341 05/26/21 0533  05/27/21 0344 05/28/21 0425  AST 22  --  18  --   --   --   ALT 30  --  25  --   --   --   ALKPHOS 111  --  98  --   --   --   BILITOT 0.4  --  0.4  --   --   --   PROT 6.5  --  5.8*  --   --   --   ALBUMIN 3.1* 2.7* 2.7* 2.0* 2.5* 2.1*   Recent Labs  Lab 05/23/21 0422  LIPASE 48   No results for input(s): AMMONIA in the last 168 hours. Coagulation Profile: Recent Labs  Lab 05/23/21 0422 05/24/21 0341  INR 1.2 1.2   Cardiac Enzymes: No results for input(s): CKTOTAL, CKMB, CKMBINDEX, TROPONINI in the last 168 hours. BNP (last 3 results) No results for input(s): PROBNP in the last 8760 hours. HbA1C: No results for input(s): HGBA1C in the last 72 hours.  CBG: Recent  Labs  Lab 05/27/21 1640 05/27/21 2030 05/27/21 2358 05/28/21 0436 05/28/21 0756  GLUCAP 85 79 91 98 90   Lipid Profile: No results for input(s): CHOL, HDL, LDLCALC, TRIG, CHOLHDL, LDLDIRECT in the last 72 hours. Thyroid Function Tests: No results for input(s): TSH, T4TOTAL, FREET4, T3FREE, THYROIDAB in the last 72 hours. Anemia Panel: No results for input(s): VITAMINB12, FOLATE, FERRITIN, TIBC, IRON, RETICCTPCT in the last 72 hours. Sepsis Labs: Recent Labs  Lab 05/23/21 0422 05/24/21 0341  PROCALCITON  --  0.63  LATICACIDVEN 1.0  --     Recent Results (from the past 240 hour(s))  Resp Panel by RT-PCR (Flu A&B, Covid) Nasopharyngeal Swab     Status: None   Collection Time: 05/23/21  3:59 AM   Specimen: Nasopharyngeal Swab; Nasopharyngeal(NP) swabs in vial transport medium  Result Value Ref Range Status   SARS Coronavirus 2 by RT PCR NEGATIVE NEGATIVE Final    Comment: (NOTE) SARS-CoV-2 target nucleic acids are NOT DETECTED.  The SARS-CoV-2 RNA is generally detectable in upper respiratory specimens during the acute phase of infection. The lowest concentration of SARS-CoV-2 viral copies this assay can detect is 138 copies/mL. A negative result does not preclude SARS-Cov-2 infection and should  not be used as the sole basis for treatment or other patient management decisions. A negative result may occur with  improper specimen collection/handling, submission of specimen other than nasopharyngeal swab, presence of viral mutation(s) within the areas targeted by this assay, and inadequate number of viral copies(<138 copies/mL). A negative result must be combined with clinical observations, patient history, and epidemiological information. The expected result is Negative.  Fact Sheet for Patients:  EntrepreneurPulse.com.au  Fact Sheet for Healthcare Providers:  IncredibleEmployment.be  This test is no t yet approved or cleared by the [place] FDA and  has been authorized for detection and/or diagnosis of SARS-CoV-2 by FDA under an Emergency Use Authorization (EUA). This EUA will remain  in effect (meaning this test can be used) for the duration of the COVID-19 declaration under Section 564(b)(1) of the Act, 21 U.S.C.section 360bbb-3(b)(1), unless the authorization is terminated  or revoked sooner.       Influenza A by PCR NEGATIVE NEGATIVE Final   Influenza B by PCR NEGATIVE NEGATIVE Final    Comment: (NOTE) The Xpert Xpress SARS-CoV-2/FLU/RSV plus assay is intended as an aid in the diagnosis of influenza from Nasopharyngeal swab specimens and should not be used as a sole basis for treatment. Nasal washings and aspirates are unacceptable for Xpert Xpress SARS-CoV-2/FLU/RSV testing.  Fact Sheet for Patients: EntrepreneurPulse.com.au  Fact Sheet for Healthcare Providers: IncredibleEmployment.be  This test is not yet approved or cleared by the [place] FDA and has been authorized for detection and/or diagnosis of SARS-CoV-2 by FDA under an Emergency Use Authorization (EUA). This EUA will remain in effect (meaning this test can be used) for the duration of the COVID-19 declaration under  Section 564(b)(1) of the Act, 21 U.S.C. section 360bbb-3(b)(1), unless the authorization is terminated or revoked.  Performed at [facility], [place] [address] [place], [place] [address]   Blood culture (routine x 2)     Status: None (Preliminary result)   Collection Time: 05/23/21  4:22 AM   Specimen: BLOOD  Result Value Ref Range Status   Specimen Description   Final    BLOOD BLOOD RIGHT FOREARM Performed at [place] [address] [place], [place] [address]    Special Requests   Final  BOTTLES DRAWN AEROBIC ONLY Blood Culture adequate volume Performed at [place] [address], [place], [place] [address]    Culture   Final    NO GROWTH 4 DAYS Performed at [facility], [place] [address], [place], [place] [address]    Report Status PENDING  Incomplete  Blood culture (routine x 2)     Status: None (Preliminary result)   Collection Time: 05/23/21  4:42 AM   Specimen: BLOOD LEFT HAND  Result Value Ref Range Status   Specimen Description   Final    BLOOD LEFT HAND Performed at [place] [address], [place], [place] [address]    Special Requests   Final    BOTTLES DRAWN AEROBIC AND ANAEROBIC Blood Culture results may not be optimal due to an inadequate volume of blood received in culture bottles Performed at [place] [address], [place], [place] [address]    Culture   Final    NO GROWTH 4 DAYS Performed at [facility], [place] [address], [place], [place] [address]    Report Status PENDING  Incomplete  C Difficile Quick Screen w PCR reflex     Status: None   Collection Time: 05/23/21  5:02 AM   Specimen: STOOL  Result Value Ref Range Status   C Diff antigen NEGATIVE NEGATIVE Final   C Diff toxin NEGATIVE NEGATIVE Final   C Diff interpretation No C. difficile detected.  Final    Comment: Performed at [facility], [place]  [address] [place], [place] [address]  Gastrointestinal Panel by PCR , Stool     Status: None   Collection Time: 05/23/21  5:02 AM   Specimen: STOOL  Result Value Ref Range Status   Campylobacter species NOT DETECTED NOT DETECTED Final   Plesimonas shigelloides NOT DETECTED NOT DETECTED Final   Salmonella species NOT DETECTED NOT DETECTED Final   Yersinia enterocolitica NOT DETECTED NOT DETECTED Final   Vibrio species NOT DETECTED NOT DETECTED Final   Vibrio cholerae NOT DETECTED NOT DETECTED Final   Enteroaggregative E coli (EAEC) NOT DETECTED NOT DETECTED Final   Enteropathogenic E coli (EPEC) NOT DETECTED NOT DETECTED Final   Enterotoxigenic E coli (ETEC) NOT DETECTED NOT DETECTED Final   Shiga like toxin producing E coli (STEC) NOT DETECTED NOT DETECTED Final   Shigella/Enteroinvasive E coli (EIEC) NOT DETECTED NOT DETECTED Final   Cryptosporidium NOT DETECTED NOT DETECTED Final   Cyclospora cayetanensis NOT DETECTED NOT DETECTED Final   Entamoeba histolytica NOT DETECTED NOT DETECTED Final   Giardia lamblia NOT DETECTED NOT DETECTED Final   Adenovirus F40/41 NOT DETECTED NOT DETECTED Final   Astrovirus NOT DETECTED NOT DETECTED Final   Norovirus GI/GII NOT DETECTED NOT DETECTED Final   Rotavirus A NOT DETECTED NOT DETECTED Final   Sapovirus (I, II, IV, and V) NOT DETECTED NOT DETECTED Final    Comment: Performed at [facility], [address] [place], [place] [address]  Urine Culture     Status: None   Collection Time: 05/23/21  9:02 AM   Specimen: Urine, Clean Catch  Result Value Ref Range Status   Specimen Description   Final    URINE, CLEAN CATCH Performed at [facility], [place] [address] [place], [place] [address]    Special Requests   Final    NONE Performed at [facility], [place] [address], [place], [place] [address]  Culture   Final    NO GROWTH Performed at [facility], [place] [address] [place], [place]  [address]    Report Status 05/24/2021 FINAL  Final         Radiology Studies: No results found.      Scheduled Meds:  (feeding supplement) PROSource Plus  30 mL Oral BID BM   sodium chloride   Intravenous Once   aspirin EC  81 mg Oral Daily   atorvastatin  20 mg Oral QHS   darifenacin  7.5 mg Oral Daily   feeding supplement  1 Container Oral BID BM   heparin  5,000 Units Subcutaneous Q8H   insulin aspart  0-6 Units Subcutaneous TID WC   mouth rinse  15 mL Mouth Rinse BID   metoCLOPramide  10 mg Oral TID AC & HS   multivitamin with minerals  1 tablet Oral Daily   ondansetron (ZOFRAN) IV  4 mg Intravenous Once   potassium chloride  40 mEq Oral BID   tamsulosin  0.4 mg Oral Daily   Continuous Infusions:     LOS: 5 days    Time spent: 35 minutes    Kayleen Memos, MD [facility]   If 7PM-7AM, please contact night-coverage [email or web address]  05/28/2021, 11:14 AM

## 2021-05-29 DIAGNOSIS — N179 Acute kidney failure, unspecified: Secondary | ICD-10-CM | POA: Diagnosis not present

## 2021-05-29 DIAGNOSIS — R710 Precipitous drop in hematocrit: Secondary | ICD-10-CM | POA: Diagnosis not present

## 2021-05-29 DIAGNOSIS — K521 Toxic gastroenteritis and colitis: Secondary | ICD-10-CM | POA: Diagnosis not present

## 2021-05-29 LAB — CBC WITH DIFFERENTIAL/PLATELET
Abs Immature Granulocytes: 0.02 10*3/uL (ref 0.00–0.07)
Basophils Absolute: 0 10*3/uL (ref 0.0–0.1)
Basophils Relative: 1 %
Eosinophils Absolute: 0 10*3/uL (ref 0.0–0.5)
Eosinophils Relative: 1 %
HCT: 28.2 % — ABNORMAL LOW (ref 39.0–52.0)
Hemoglobin: 9.5 g/dL — ABNORMAL LOW (ref 13.0–17.0)
Immature Granulocytes: 0 %
Lymphocytes Relative: 8 %
Lymphs Abs: 0.5 10*3/uL — ABNORMAL LOW (ref 0.7–4.0)
MCH: 30.6 pg (ref 26.0–34.0)
MCHC: 33.7 g/dL (ref 30.0–36.0)
MCV: 91 fL (ref 80.0–100.0)
Monocytes Absolute: 1 10*3/uL (ref 0.1–1.0)
Monocytes Relative: 17 %
Neutro Abs: 4.3 10*3/uL (ref 1.7–7.7)
Neutrophils Relative %: 73 %
Platelets: 209 10*3/uL (ref 150–400)
RBC: 3.1 MIL/uL — ABNORMAL LOW (ref 4.22–5.81)
RDW: 15.6 % — ABNORMAL HIGH (ref 11.5–15.5)
WBC: 5.8 10*3/uL (ref 4.0–10.5)
nRBC: 0 % (ref 0.0–0.2)

## 2021-05-29 LAB — COMPREHENSIVE METABOLIC PANEL
ALT: 47 U/L — ABNORMAL HIGH (ref 0–44)
AST: 47 U/L — ABNORMAL HIGH (ref 15–41)
Albumin: 2.3 g/dL — ABNORMAL LOW (ref 3.5–5.0)
Alkaline Phosphatase: 78 U/L (ref 38–126)
Anion gap: 8 (ref 5–15)
BUN: 34 mg/dL — ABNORMAL HIGH (ref 8–23)
CO2: 20 mmol/L — ABNORMAL LOW (ref 22–32)
Calcium: 8.9 mg/dL (ref 8.9–10.3)
Chloride: 109 mmol/L (ref 98–111)
Creatinine, Ser: 4.06 mg/dL — ABNORMAL HIGH (ref 0.61–1.24)
GFR, Estimated: 15 mL/min — ABNORMAL LOW (ref >60)
Glucose, Bld: 77 mg/dL (ref 70–99)
Potassium: 4.2 mmol/L (ref 3.5–5.1)
Sodium: 137 mmol/L (ref 135–145)
Total Bilirubin: 0.5 mg/dL (ref 0.3–1.2)
Total Protein: 5 g/dL — ABNORMAL LOW (ref 6.5–8.1)

## 2021-05-29 LAB — MAGNESIUM: Magnesium: 1.9 mg/dL (ref 1.7–2.4)

## 2021-05-29 LAB — CBC
HCT: 30.8 % — ABNORMAL LOW (ref 39.0–52.0)
Hemoglobin: 10.4 g/dL — ABNORMAL LOW (ref 13.0–17.0)
MCH: 30.3 pg (ref 26.0–34.0)
MCHC: 33.8 g/dL (ref 30.0–36.0)
MCV: 89.8 fL (ref 80.0–100.0)
Platelets: 245 10*3/uL (ref 150–400)
RBC: 3.43 MIL/uL — ABNORMAL LOW (ref 4.22–5.81)
RDW: 15.6 % — ABNORMAL HIGH (ref 11.5–15.5)
WBC: 6.2 10*3/uL (ref 4.0–10.5)
nRBC: 0 % (ref 0.0–0.2)

## 2021-05-29 LAB — GLUCOSE, CAPILLARY
Glucose-Capillary: 61 mg/dL — ABNORMAL LOW (ref 70–99)
Glucose-Capillary: 76 mg/dL (ref 70–99)
Glucose-Capillary: 82 mg/dL (ref 70–99)
Glucose-Capillary: 87 mg/dL (ref 70–99)

## 2021-05-29 LAB — PHOSPHORUS: Phosphorus: 3 mg/dL (ref 2.5–4.6)

## 2021-05-29 MED ORDER — LOPERAMIDE HCL 2 MG PO CAPS: 2.0000 mg | ORAL_CAPSULE | ORAL | 0 refills | Status: AC | PRN

## 2021-05-29 MED ORDER — METOCLOPRAMIDE HCL 10 MG PO TABS
10.0000 mg | ORAL_TABLET | Freq: Three times a day (TID) | ORAL | 0 refills | Status: DC
Start: 2021-05-29 — End: 2021-07-22

## 2021-05-29 MED ORDER — ATORVASTATIN CALCIUM 20 MG PO TABS: 20.0000 mg | ORAL_TABLET | Freq: Every day | ORAL | 0 refills | Status: DC

## 2021-05-29 MED ORDER — ADULT MULTIVITAMIN W/MINERALS CH: 1.0000 | ORAL_TABLET | Freq: Every day | ORAL | 0 refills | Status: AC

## 2021-05-29 MED ORDER — PROSOURCE PLUS PO LIQD: 30.0000 mL | Freq: Two times a day (BID) | ORAL | 0 refills | Status: AC

## 2021-05-29 NOTE — TOC Transition Note (Signed)
Transition of Care [facility]) - CM/SW Discharge Note   Patient Details  Name: Antonio Adams MRN: [id number] Date of Birth: 09/15/1945  Transition of Care [facility] CM/SW Contact:  Other Atienza, Marta Lamas, LCSW Phone Number: 05/29/2021, 12:18 PM   Clinical Narrative:       Final next level of care: Home w Home Health Services Barriers to Discharge: No Barriers Identified   Patient Goals and CMS Choice Patient states their goals for this hospitalization and ongoing recovery are:: Return Home CMS Medicare.gov Compare Post Acute Care list provided to:: Legal Guardian Choice offered to / list presented to : NA  Discharge Placement                       Discharge Plan and Services   Discharge Planning Services: CM Consult            DME Arranged: N/A DME Agency: NA       HH Arranged: PT, OT HH Agency: [place] Date [facility] Agency Contacted: 05/29/21 Time [place]: [phone] Representative spoke with at [place]: Alphonzo Lemmings - # [phone]  Social Determinants of Health ([place]) Interventions     Readmission Risk Interventions No flowsheet data found.

## 2021-05-29 NOTE — Progress Notes (Addendum)
° °  Patient Name: Antonio Adams Date of Encounter: 05/29/2021, 10:58 AM    Subjective  Patient says "I want to go home".  Stools still loose but controllable stomach feels better.  Not vomiting.  No abdominal pain.  Was transfused 1 unit of blood.   Objective  BP (!) 97/55 (BP Location: Right Arm)    Pulse 97    Temp 98.3 F (36.8 C) (Oral)    Resp 20    Ht 6' (1.829 m)    Wt 84.4 kg    SpO2 100%    BMI 25.23 kg/m  Elderly black man in no acute distress looking more energetic today. Abdomen is soft and nontender.  Recent Labs  Lab 05/23/21 1636 05/24/21 0341 05/26/21 0533 05/26/21 1549 05/27/21 0344 05/28/21 0425 05/29/21 0800  NA 140 140 140  --  137 138 137  K 4.7 4.4 2.6*   < > 3.2* 3.0* 4.2  CL 119* 120* 110  --  103 107 109  CO2 11* 9* 19*  --  22 23 20*  GLUCOSE 94 87 95  --  101* 100* 77  BUN 72* 62* 43*  --  39* 35* 34*  CREATININE 5.48* 5.41* 4.59*  --  4.47* 4.29* 4.06*  CALCIUM 8.8* 9.0 8.1*  --  8.4* 8.4* 8.9  MG  --   --   --   --  1.7  --  1.9  PHOS 6.6*  --  3.6  --  2.9 3.0 3.0   < > = values in this interval not displayed.   Recent Labs  Lab 05/27/21 1317 05/29/21 0327 05/29/21 0800  HGB 7.5* 10.4* 9.5*  HCT 21.8* 30.8* 28.2*  WBC 5.6 6.2 5.8  PLT 208 245 209       Assessment and Plan  Colitis from checkpoint inhibitor for renal cell carcinoma Dehydration AKI on CKD Nausea Hypokalemia Malnutrition Decreased hemoglobin no bleeding improved after transfusion DM2  He seems much better today.  I am okay with him going home.  Would treat with p.o. Reglan for about 1 week.  He does not need GI follow-up unless oncology thinks he needs to come back with [place].  Over time think should improve any can use as needed loperamide for the diarrhea.  Call us back if questions if he does not go home.  I am signing off.  Gatha Mayer, MD, [facility] 05/29/2021 10:58 AM

## 2021-05-29 NOTE — Discharge Summary (Signed)
Discharge Summary  Antonio Adams YJE:[id number] DOB: May 09, 1946  PCP: [name], MD  Admit date: 05/23/2021 Discharge date: 05/29/2021  Time spent: 35 minutes.  Recommendations for Outpatient Follow-up:  Follow-up with your medical oncologist within a week.  Follow-up with GI within a week. Follow-up with your primary care provider in 1 to 2 weeks. Take your medications as prescribed. Continue PT OT with assistance and fall precautions.  Discharge Diagnoses:  Active Hospital Problems   Diagnosis Date Noted   Decreased hemoglobin    Drug-induced colitis    Metabolic acidosis 63/78/5885   Dehydration 05/24/2021   CKD (chronic kidney disease) stage 5, GFR less than 15 ml/min (HCC) 05/24/2021   AKI (acute kidney injury) ([place]) 05/24/2021   BPH (benign prostatic hyperplasia) 05/24/2021   Metastatic renal cell carcinoma ([place]) 02/26/2020   Essential hypertension 03/16/2007   Type 2 diabetes mellitus with diabetic cataract ([place]) 03/16/2007    Resolved Hospital Problems  No resolved problems to display.    Discharge Condition: Stable  Diet recommendation: Resume previous diet.  Vitals:   05/28/21 2257 05/29/21 0531  BP: 110/71 (!) 97/55  Pulse: 91 97  Resp: 16 20  Temp: 98.6 F (37 C) 98.3 F (36.8 C)  SpO2: 98% 100%    History of present illness:  76 year old male with a history of renal cell carcinoma most recently treated with nivolumab, chronic kidney disease stage IV, pre-diabetes, hypertension, admitted to the hospital with 3 to 4-week history of persistent vomiting and diarrhea.  He was noted to be significantly dehydrated with associated acute kidney injury and non anion gap metabolic acidosis.  Etiology of diarrhea is not entirely clear.  Received supportive treatment with IV fluids, IV antiemetics, and antidiarrheal agent.  Gastroenterology was consulted and assisted with the management.  His electrolytes were repleted, his renal function, nausea and  diarrhea improved.   05/29/2021: Patient was seen at his bedside.  Family member present in the room.  He is eager to go home  Hospital Course:  Active Problems:   Type 2 diabetes mellitus with diabetic cataract (HCC)   Essential hypertension   Metastatic renal cell carcinoma (HCC)   Metabolic acidosis   Dehydration   CKD (chronic kidney disease) stage 5, GFR less than 15 ml/min (HCC)   AKI (acute kidney injury) (HCC)   BPH (benign prostatic hyperplasia)   Drug-induced colitis   Decreased hemoglobin  Improving diarrhea, nausea, vomiting now intermittent. -Etiology is unclear -Stool studies including C. difficile and GI pathogen panel unremarkable -CT scan did not show any evidence of colitis, although did show liquid stool in colon Seen by GI. Po Reglan, prn antiemetics. Follow up with your PCP, oncologist, GI.   Improving acute kidney injury on chronic kidney disease stage IV Appears to be close to his baseline creatinine Creatinine 4.06 with GFR of 15. Creatinine 5.8 on admission. -No evidence of hydronephrosis on imaging Received gentle IV fluid hydration.   Symptomatic anemia with hemoglobin 7.5 post 1 U PRBC transfusion on 05/28/21 Was transfused 1 unit on 05/28/2021 for symptomatic anemia with Hg 7.5K Repeat Hg 9.5K on 05/29/21.  Metastatic renal cancer -Continue follow-up with Dr. Alen Blew   Resolved post repletion: Refractory hypokalemia Serum potassium 3.0>> 4.2   Improved non-anion gap metabolic acidosis -Related to GI losses with persistent diarrhea and vomiting Completed bicarb drip on 05/26/2021. Serum bicarb 9> 19>22>23>20 Received p.o. bicarb replacement 4 times daily x2 days. Received potassium supplement while on bicarb replacement   Resolved hypotension Continues  to maintain MAP>65   History of hypertension -Holding home amlodipine due to soft BPs Follow up with your PCP   Resolved Sinus tachycardia likely 2/2 to dehydration and hypovolemia.    Prediabetes Hemoglobin A1c 5.8 on 05/23/2021   Hyperlipidemia -Continue statin   BPH -Continue home regimen      Code Status: Full code  Consultants:  Gastroenterology    Discharge Exam: BP (!) 97/55 (BP Location: Right Arm)    Pulse 97    Temp 98.3 F (36.8 C) (Oral)    Resp 20    Ht 6' (1.829 m)    Wt 84.4 kg    SpO2 100%    BMI 25.23 kg/m  General: 76 y.o. year-old male well developed well nourished in no acute distress.  Alert and oriented x3. Cardiovascular: Regular rate and rhythm with no rubs or gallops.  No thyromegaly or JVD noted.   Respiratory: Clear to auscultation with no wheezes or rales. Good inspiratory effort. Abdomen: Soft nontender nondistended with normal bowel sounds x4 quadrants. Musculoskeletal: No lower extremity edema. 2/4 pulses in all 4 extremities. Skin: No ulcerative lesions noted or rashes, Psychiatry: Mood is appropriate for condition and setting  Discharge Instructions You were cared for by a hospitalist during your hospital stay. If you have any questions about your discharge medications or the care you received while you were in the hospital after you are discharged, you can call the unit and asked to speak with the hospitalist on call if the hospitalist that took care of you is not available. Once you are discharged, your primary care physician will handle any further medical issues. Please note that NO REFILLS for any discharge medications will be authorized once you are discharged, as it is imperative that you return to your primary care physician (or establish a relationship with a primary care physician if you do not have one) for your aftercare needs so that they can reassess your need for medications and monitor your lab values.   Allergies as of 05/29/2021       Reactions   Ace Inhibitors Swelling, Other (See Comments)   lisinopril   Opdivo [nivolumab] Rash        Medication List     STOP taking these medications    amLODipine 5  MG tablet Commonly known as: NORVASC   indapamide 1.25 MG tablet Commonly known as: LOZOL   insulin glargine 100 UNIT/ML injection Commonly known as: LANTUS   traMADol 50 MG tablet Commonly known as: ULTRAM       TAKE these medications    (feeding supplement) PROSource Plus liquid Take 30 mLs by mouth 2 (two) times daily between meals for 7 days.   aspirin EC 81 MG tablet Take 81 mg by mouth daily. Swallow whole.   atorvastatin 20 MG tablet Commonly known as: LIPITOR Take 1 tablet (20 mg total) by mouth at bedtime. What changed:  medication strength how much to take when to take this   cholecalciferol 1000 units tablet Commonly known as: VITAMIN D Take 1,000 Units by mouth daily.   ferrous sulfate 325 (65 FE) MG tablet Take 1 tablet (325 mg total) by mouth 2 (two) times daily. Patient needs office visit for further refills   FISH OIL PO Take 1 tablet by mouth daily.   fluocinonide-emollient 0.05 % cream Commonly known as: LIDEX-E Apply 1 application topically 2 (two) times daily.   FreeStyle Libre 14 Day Reader Kerrin [place] 1 Act by Does not apply route daily.  FreeStyle Libre 2 Sensor Misc 1 Act by Does not apply route daily.   gabapentin 100 MG capsule Commonly known as: NEURONTIN Take 100 mg by mouth at bedtime.   gemfibrozil 600 MG tablet Commonly known as: LOPID Take 600 mg by mouth 2 (two) times daily before a meal.   Insulin Pen Needle 32G X 6 MM Misc 1 Act by Does not apply route daily.   Lokelma 10 g Pack packet Generic drug: sodium zirconium cyclosilicate Take 10 g by mouth every Monday, Wednesday, and Friday.   loperamide 2 MG capsule Commonly known as: IMODIUM Take 1 capsule (2 mg total) by mouth as needed for up to 5 days for diarrhea or loose stools.   metoCLOPramide 10 MG tablet Commonly known as: REGLAN Take 1 tablet (10 mg total) by mouth 4 (four) times daily -  before meals and at bedtime for 5 days.   multivitamin with minerals  Tabs tablet Take 1 tablet by mouth daily. Start taking on: May 30, 2021   ONE TOUCH ULTRA TEST test strip Generic drug: glucose blood USE 2 (TWO) TIMES DAILY. AND LANCETS 2/DAY 250.03   OVER THE COUNTER MEDICATION Take 1 tablet by mouth daily. Focus factor   polyvinyl alcohol 1.4 % ophthalmic solution Commonly known as: LIQUIFILM TEARS Place 1 drop into both eyes as needed for dry eyes.   solifenacin 5 MG tablet Commonly known as: VESICARE Take 1 tablet (5 mg total) by mouth daily.   tamsulosin 0.4 MG Caps capsule Commonly known as: FLOMAX TAKE 1 CAPSULE (0.4 MG TOTAL) BY MOUTH DAILY.       Allergies  Allergen Reactions   Ace Inhibitors Swelling and Other (See Comments)    lisinopril   Opdivo [Nivolumab] Rash    Follow-up Information     [name], MD. Call today.   Specialty: Internal Medicine Why: Please call for a posthospital follow-up appointment. Contact information: [place] [address] [phone]         Gatha Mayer, MD. Call today.   Specialty: Gastroenterology Why: Please call for a posthospital follow-up appointment. Contact information: [address] [address] [phone]         Wyatt Portela, MD. Call today.   Specialty: Oncology Why: Please call for a posthospital follow-up appointment. Contact information: [place] [address] [phone]                  The results of significant diagnostics from this hospitalization (including imaging, microbiology, ancillary and laboratory) are listed below for reference.    Significant Diagnostic Studies: CT ABDOMEN PELVIS WO CONTRAST  Result Date: 05/23/2021 CLINICAL DATA:  76 year old male with nausea vomiting and diarrhea. History of recurrent kidney cancer status post surgery and ablation of right liver metastasis. EXAM: CT ABDOMEN AND PELVIS WITHOUT CONTRAST TECHNIQUE: Multidetector CT imaging of the  abdomen and pelvis was performed following the standard protocol without IV contrast. COMPARISON:  Noncontrast restaging CT Chest, Abdomen, and Pelvis 05/06/2021. FINDINGS: Lower chest: Mildly improved lung volumes and ventilation. Mild lung base atelectasis and borderline to mild bronchiectasis. Calcified coronary artery atherosclerosis and/or stents. No pericardial or pleural effusion. Hepatobiliary: Stable noncontrast post treatment appearance of the liver. Gallbladder diminutive or absent. Pancreas: Negative. Spleen: Diminutive, negative. Adrenals/Urinary Tract: Unchanged noncontrast appearance of the right renal fossa since last month including 2.7 cm soft tissue nodule arising from or adjacent to the right adrenal gland on series 2, image  26. Architectural distortion and more since pouch, stable surgical clips and partial right nephrectomy appearance. Contralateral left adrenal gland and left kidney appears stable and negative. No acute hydronephrosis or hydroureter. Diminutive, unremarkable bladder. Stomach/Bowel: Fluid mildly distending the large bowel from the splenic flexure distally. Similar fluid in the ascending colon. Redundant large bowel. Normal appendix series 2, image 65. No discrete large bowel inflammation. Mild small bowel fluid. No dilated small bowel loops. Stomach and duodenum appear stable and within normal limits. No free air, free fluid. Vascular/Lymphatic: Mildly tortuous aorta. Mild Aortoiliac calcified atherosclerosis. No lymphadenopathy identified. Reproductive: Prostate brachytherapy. Other: No pelvic free fluid. Musculoskeletal: Stable visualized osseous structures. Somewhat heterogeneous bone mineralization but no destructive osseous lesion identified. IMPRESSION: 1. Fluid throughout the large bowel compatible with diarrhea. Normal appendix, and no discrete bowel inflammation or evidence of bowel obstruction. 2. Otherwise stable noncontrast CT appearance of the abdomen and pelvis  since the restaging CTs last month, including the abnormal right renal space (please see that report). 3. Aortic Atherosclerosis (ICD10-I70.0). Electronically Signed   By: [name] M.D.   On: 05/23/2021 06:12   CT ABDOMEN PELVIS WO CONTRAST  Result Date: 05/07/2021 CLINICAL DATA:  Recurrent kidney cancer EXAM: CT CHEST, ABDOMEN AND PELVIS WITHOUT CONTRAST TECHNIQUE: Multidetector CT imaging of the chest, abdomen and pelvis was performed following the standard protocol without IV contrast. COMPARISON:  CT abdomen/pelvis dated 12/24/2020. CT chest abdomen pelvis dated 10/12/2020. FINDINGS: CT CHEST FINDINGS Cardiovascular: Heart is normal in size.  No pericardial effusion. No evidence of thoracic aortic aneurysm. Mild atherosclerotic calcifications of the aortic arch. Three vessel coronary atherosclerosis. Hypodense blood pool relative to myocardium, suggesting anemia. Mediastinum/Nodes: No suspicious mediastinal lymphadenopathy. Visualized thyroid is unremarkable. Lungs/Pleura: No suspicious pulmonary nodules. Mild subpleural reticulation/atelectasis in the bilateral lower lobes. No focal consolidation. No pleural effusion or pneumothorax. Musculoskeletal: Moderate degenerative changes of the thoracic spine. CT ABDOMEN PELVIS FINDINGS Hepatobiliary: Patient is status post microwave ablation of the known posterior right hepatic lobe metastasis, which now measures approximately 2.8 cm (series 2/image 83), previously 4.3 cm. No new/suspicious hepatic lesions, noting the limitations of unenhanced CT. Status post cholecystectomy. No intrahepatic or extrahepatic ductal dilatation. Pancreas: Within normal limits. Spleen: Within normal limits. Adrenals/Urinary Tract: Status post right adrenalectomy. 2.7 cm soft tissue nodule in the surgical bed (series 2/image 85), previously 2.6 cm. 9 mm perihepatic lesion along the hepatorenal fossa (series 2/image 51), previously 11 mm. Status post right upper pole partial  nephrectomy. 2.3 cm right lower pole renal cyst, unchanged. Left kidney is notable for a 16 mm upper pole cyst (series 2/image 56), previously 17 mm. 14 mm exophytic probable cyst along the posterior lower kidney (series 2/image 65), previously 13 mm. No hydronephrosis. Bladder is mildly thick-walled although underdistended. Stomach/Bowel: Stomach is within normal limits. No evidence of bowel obstruction. Normal appendix (series 2/image 94). No colonic wall thickening or inflammatory changes. Vascular/Lymphatic: No evidence of abdominal aortic aneurysm. Atherosclerotic calcifications of the abdominal aorta and branch vessels. No suspicious abdominopelvic lymphadenopathy. Reproductive: Brachytherapy seeds in the prostate. Other: No abdominopelvic ascites. Mild fat in the bilateral inguinal canals. Musculoskeletal: Moderate degenerative changes of the lumbar spine. IMPRESSION: Status post partial right nephrectomy, right adrenalectomy, and microwave ablation of the posterior right hepatic lobe metastasis. 2.8 cm lesion in the posterior right hepatic lobe, improved. Nodularity in the right adrenalectomy bed and hepatorenal fossa, grossly unchanged. Brachytherapy seeds in the prostate. No evidence of metastatic disease in the chest. Electronically Signed   By: Bertis Ruddy  [place] Pink M.D.   On: 05/07/2021 09:44   DG Chest 2 View  Result Date: 05/20/2021 CLINICAL DATA:  sob, prostate cancer, renal cancer, gastric cancer EXAM: CHEST - 2 VIEW COMPARISON:  Chest x-ray 07/22/2020, CT chest 10/12/2020 FINDINGS: The heart and mediastinal contours are within normal limits. No focal consolidation. No pulmonary edema. No pleural effusion. No pneumothorax. No acute osseous abnormality. Right upper quadrant surgical clips. IMPRESSION: No active cardiopulmonary disease. Electronically Signed   By: Iven Finn M.D.   On: 05/20/2021 21:09   CT Chest Wo Contrast  Result Date: 05/07/2021 CLINICAL DATA:  Recurrent kidney cancer  EXAM: CT CHEST, ABDOMEN AND PELVIS WITHOUT CONTRAST TECHNIQUE: Multidetector CT imaging of the chest, abdomen and pelvis was performed following the standard protocol without IV contrast. COMPARISON:  CT abdomen/pelvis dated 12/24/2020. CT chest abdomen pelvis dated 10/12/2020. FINDINGS: CT CHEST FINDINGS Cardiovascular: Heart is normal in size.  No pericardial effusion. No evidence of thoracic aortic aneurysm. Mild atherosclerotic calcifications of the aortic arch. Three vessel coronary atherosclerosis. Hypodense blood pool relative to myocardium, suggesting anemia. Mediastinum/Nodes: No suspicious mediastinal lymphadenopathy. Visualized thyroid is unremarkable. Lungs/Pleura: No suspicious pulmonary nodules. Mild subpleural reticulation/atelectasis in the bilateral lower lobes. No focal consolidation. No pleural effusion or pneumothorax. Musculoskeletal: Moderate degenerative changes of the thoracic spine. CT ABDOMEN PELVIS FINDINGS Hepatobiliary: Patient is status post microwave ablation of the known posterior right hepatic lobe metastasis, which now measures approximately 2.8 cm (series 2/image 83), previously 4.3 cm. No new/suspicious hepatic lesions, noting the limitations of unenhanced CT. Status post cholecystectomy. No intrahepatic or extrahepatic ductal dilatation. Pancreas: Within normal limits. Spleen: Within normal limits. Adrenals/Urinary Tract: Status post right adrenalectomy. 2.7 cm soft tissue nodule in the surgical bed (series 2/image 85), previously 2.6 cm. 9 mm perihepatic lesion along the hepatorenal fossa (series 2/image 51), previously 11 mm. Status post right upper pole partial nephrectomy. 2.3 cm right lower pole renal cyst, unchanged. Left kidney is notable for a 16 mm upper pole cyst (series 2/image 56), previously 17 mm. 14 mm exophytic probable cyst along the posterior lower kidney (series 2/image 65), previously 13 mm. No hydronephrosis. Bladder is mildly thick-walled although  underdistended. Stomach/Bowel: Stomach is within normal limits. No evidence of bowel obstruction. Normal appendix (series 2/image 94). No colonic wall thickening or inflammatory changes. Vascular/Lymphatic: No evidence of abdominal aortic aneurysm. Atherosclerotic calcifications of the abdominal aorta and branch vessels. No suspicious abdominopelvic lymphadenopathy. Reproductive: Brachytherapy seeds in the prostate. Other: No abdominopelvic ascites. Mild fat in the bilateral inguinal canals. Musculoskeletal: Moderate degenerative changes of the lumbar spine. IMPRESSION: Status post partial right nephrectomy, right adrenalectomy, and microwave ablation of the posterior right hepatic lobe metastasis. 2.8 cm lesion in the posterior right hepatic lobe, improved. Nodularity in the right adrenalectomy bed and hepatorenal fossa, grossly unchanged. Brachytherapy seeds in the prostate. No evidence of metastatic disease in the chest. Electronically Signed   By: Julian Hy M.D.   On: 05/07/2021 09:44   DG Chest Portable 1 View  Result Date: 05/23/2021 CLINICAL DATA:  Hypotension. EXAM: PORTABLE CHEST 1 VIEW COMPARISON:  05/20/2021. FINDINGS: The heart size and mediastinal contours are within normal limits. Both lungs are clear. No acute osseous abnormality. IMPRESSION: No acute cardiopulmonary process. Electronically Signed   By: Brett Fairy M.D.   On: 05/23/2021 04:53    Microbiology: Recent Results (from the past 240 hour(s))  Resp Panel by RT-PCR (Flu A&B, Covid) Nasopharyngeal Swab     Status: None   Collection Time: 05/23/21  3:59 AM   Specimen: Nasopharyngeal Swab; Nasopharyngeal(NP) swabs in vial transport medium  Result Value Ref Range Status   SARS Coronavirus 2 by RT PCR NEGATIVE NEGATIVE Final    Comment: (NOTE) SARS-CoV-2 target nucleic acids are NOT DETECTED.  The SARS-CoV-2 RNA is generally detectable in upper respiratory specimens during the acute phase of infection. The  lowest concentration of SARS-CoV-2 viral copies this assay can detect is 138 copies/mL. A negative result does not preclude SARS-Cov-2 infection and should not be used as the sole basis for treatment or other patient management decisions. A negative result may occur with  improper specimen collection/handling, submission of specimen other than nasopharyngeal swab, presence of viral mutation(s) within the areas targeted by this assay, and inadequate number of viral copies(<138 copies/mL). A negative result must be combined with clinical observations, patient history, and epidemiological information. The expected result is Negative.  Fact Sheet for Patients:  EntrepreneurPulse.com.au  Fact Sheet for Healthcare Providers:  IncredibleEmployment.be  This test is no t yet approved or cleared by the [place] FDA and  has been authorized for detection and/or diagnosis of SARS-CoV-2 by FDA under an Emergency Use Authorization (EUA). This EUA will remain  in effect (meaning this test can be used) for the duration of the COVID-19 declaration under Section 564(b)(1) of the Act, 21 U.S.C.section 360bbb-3(b)(1), unless the authorization is terminated  or revoked sooner.       Influenza A by PCR NEGATIVE NEGATIVE Final   Influenza B by PCR NEGATIVE NEGATIVE Final    Comment: (NOTE) The Xpert Xpress SARS-CoV-2/FLU/RSV plus assay is intended as an aid in the diagnosis of influenza from Nasopharyngeal swab specimens and should not be used as a sole basis for treatment. Nasal washings and aspirates are unacceptable for Xpert Xpress SARS-CoV-2/FLU/RSV testing.  Fact Sheet for Patients: EntrepreneurPulse.com.au  Fact Sheet for Healthcare Providers: IncredibleEmployment.be  This test is not yet approved or cleared by the [place] FDA and has been authorized for detection and/or diagnosis of SARS-CoV-2 by FDA under  an Emergency Use Authorization (EUA). This EUA will remain in effect (meaning this test can be used) for the duration of the COVID-19 declaration under Section 564(b)(1) of the Act, 21 U.S.C. section 360bbb-3(b)(1), unless the authorization is terminated or revoked.  Performed at [facility], [place] [address], [place], [place] [address]   Blood culture (routine x 2)     Status: None   Collection Time: 05/23/21  4:22 AM   Specimen: BLOOD  Result Value Ref Range Status   Specimen Description   Final    BLOOD BLOOD RIGHT FOREARM Performed at [place] [address], [place], [place] [address]    Special Requests   Final    BOTTLES DRAWN AEROBIC ONLY Blood Culture adequate volume Performed at [facility] [address], [place], [place] [address]    Culture   Final    NO GROWTH 5 DAYS Performed at [facility], [place] [address], [place], [place] [address]    Report Status 05/28/2021 FINAL  Final  Blood culture (routine x 2)     Status: None   Collection Time: 05/23/21  4:42 AM   Specimen: BLOOD LEFT HAND  Result Value Ref Range Status   Specimen Description   Final    BLOOD LEFT HAND Performed at [facility] [address], [place],  [address]    Special Requests   Final    BOTTLES  DRAWN AEROBIC AND ANAEROBIC Blood Culture results may not be optimal due to an inadequate volume of blood received in culture bottles Performed at [facility], [place] [address], [place], [place] [address]    Culture   Final    NO GROWTH 5 DAYS Performed at [facility], [place] [address] [place], [place] [address]    Report Status 05/28/2021 FINAL  Final  C Difficile Quick Screen w PCR reflex     Status: None   Collection Time: 05/23/21  5:02 AM   Specimen: STOOL  Result Value Ref Range Status   C Diff antigen NEGATIVE NEGATIVE Final   C Diff toxin NEGATIVE NEGATIVE Final   C  Diff interpretation No C. difficile detected.  Final    Comment: Performed at [facility], [place] [address], [place], [place] [address]  Gastrointestinal Panel by PCR , Stool     Status: None   Collection Time: 05/23/21  5:02 AM   Specimen: STOOL  Result Value Ref Range Status   Campylobacter species NOT DETECTED NOT DETECTED Final   Plesimonas shigelloides NOT DETECTED NOT DETECTED Final   Salmonella species NOT DETECTED NOT DETECTED Final   Yersinia enterocolitica NOT DETECTED NOT DETECTED Final   Vibrio species NOT DETECTED NOT DETECTED Final   Vibrio cholerae NOT DETECTED NOT DETECTED Final   Enteroaggregative E coli (EAEC) NOT DETECTED NOT DETECTED Final   Enteropathogenic E coli (EPEC) NOT DETECTED NOT DETECTED Final   Enterotoxigenic E coli (ETEC) NOT DETECTED NOT DETECTED Final   Shiga like toxin producing E coli (STEC) NOT DETECTED NOT DETECTED Final   Shigella/Enteroinvasive E coli (EIEC) NOT DETECTED NOT DETECTED Final   Cryptosporidium NOT DETECTED NOT DETECTED Final   Cyclospora cayetanensis NOT DETECTED NOT DETECTED Final   Entamoeba histolytica NOT DETECTED NOT DETECTED Final   Giardia lamblia NOT DETECTED NOT DETECTED Final   Adenovirus F40/41 NOT DETECTED NOT DETECTED Final   Astrovirus NOT DETECTED NOT DETECTED Final   Norovirus GI/GII NOT DETECTED NOT DETECTED Final   Rotavirus A NOT DETECTED NOT DETECTED Final   Sapovirus (I, II, IV, and V) NOT DETECTED NOT DETECTED Final    Comment: Performed at [facility], [address] [place], [place] [address]  Urine Culture     Status: None   Collection Time: 05/23/21  9:02 AM   Specimen: Urine, Clean Catch  Result Value Ref Range Status   Specimen Description   Final    URINE, CLEAN CATCH Performed at [facility], [place] [address], [place], [place] [address]    Special Requests   Final    NONE Performed at [facility], [place] [address]  [place], [place] [address]    Culture   Final    NO GROWTH Performed at [facility], [place] [address] [place], [place] [address]    Report Status 05/24/2021 FINAL  Final     Labs: Basic Metabolic Panel: Recent Labs  Lab 05/23/21 1636 05/24/21 0341 05/26/21 0533 05/26/21 1549 05/27/21 0344 05/28/21 0425 05/29/21 0800  NA 140 140 140  --  137 138 137  K 4.7 4.4 2.6* 3.1* 3.2* 3.0* 4.2  CL 119* 120* 110  --  103 107 109  CO2 11* 9* 19*  --  22 23 20*  GLUCOSE 94 87 95  --  101* 100* 77  BUN 72* 62* 43*  --  39* 35* 34*  CREATININE 5.48* 5.41* 4.59*  --  4.47* 4.29* 4.06*  CALCIUM 8.8* 9.0 8.1*  --  8.4* 8.4* 8.9  MG  --   --   --   --  1.7  --  1.9  PHOS 6.6*  --  3.6  --  2.9 3.0 3.0   Liver Function Tests: Recent Labs  Lab 05/23/21 0422 05/23/21 1636 05/24/21 0341 05/26/21 0533 05/27/21 0344 05/28/21 0425 05/29/21 0800  AST 22  --  18  --   --   --  47*  ALT 30  --  25  --   --   --  47*  ALKPHOS 111  --  98  --   --   --  78  BILITOT 0.4  --  0.4  --   --   --  0.5  PROT 6.5  --  5.8*  --   --   --  5.0*  ALBUMIN 3.1*   < > 2.7* 2.0* 2.5* 2.1* 2.3*   < > = values in this interval not displayed.   Recent Labs  Lab 05/23/21 0422  LIPASE 48   No results for input(s): AMMONIA in the last 168 hours. CBC: Recent Labs  Lab 05/23/21 0422 05/24/21 0341 05/26/21 0533 05/26/21 1549 05/27/21 1317 05/29/21 0327 05/29/21 0800  WBC 10.2 7.0 4.8  --  5.6 6.2 5.8  NEUTROABS 8.6*  --  3.4  --   --   --  4.3  HGB 11.2* 10.3* 7.5* 7.8* 7.5* 10.4* 9.5*  HCT 33.2* 31.3* 21.4* 22.7* 21.8* 30.8* 28.2*  MCV 92.0 93.7 88.8  --  89.7 89.8 91.0  PLT 338 299 196  --  208 245 209   Cardiac Enzymes: No results for input(s): CKTOTAL, CKMB, CKMBINDEX, TROPONINI in the last 168 hours. BNP: BNP (last 3 results) No results for input(s): BNP in the last 8760 hours.  ProBNP (last 3 results) No results for input(s): PROBNP in the last 8760 hours.  CBG: Recent Labs  Lab  05/28/21 1642 05/28/21 2005 05/29/21 0012 05/29/21 0528 05/29/21 0751  GLUCAP 111* 93 76 87 61*       Signed:  Kayleen Memos, MD [facility] 05/29/2021, 11:54 AM

## 2021-05-30 LAB — TYPE AND SCREEN
ABO/RH(D): O POS
Antibody Screen: NEGATIVE
Donor AG Type: NEGATIVE
Unit division: 0

## 2021-05-30 LAB — BPAM RBC
Blood Product Expiration Date: 202302012359
ISSUE DATE / TIME: 202301142007
Unit Type and Rh: 5100

## 2021-05-31 ENCOUNTER — Other Ambulatory Visit: Payer: Self-pay | Admitting: Interventional Radiology

## 2021-06-01 ENCOUNTER — Telehealth: Payer: Self-pay

## 2021-06-01 ENCOUNTER — Inpatient Hospital Stay: Payer: Medicare Other | Admitting: Internal Medicine

## 2021-06-01 NOTE — Telephone Encounter (Signed)
Pt has called and stated he has missed his hospital f/u visit this morning due to being told his apptmnt was at 1pm today.  Pt is wanting to know if he can please be worked in as he needs to see his PCP.  Contact pt at [phone] with update please.

## 2021-06-02 ENCOUNTER — Other Ambulatory Visit: Payer: Self-pay | Admitting: Internal Medicine

## 2021-06-02 DIAGNOSIS — N3281 Overactive bladder: Secondary | ICD-10-CM

## 2021-06-03 ENCOUNTER — Ambulatory Visit (INDEPENDENT_AMBULATORY_CARE_PROVIDER_SITE_OTHER): Payer: Medicare Other | Admitting: Internal Medicine

## 2021-06-03 ENCOUNTER — Encounter: Payer: Self-pay | Admitting: Internal Medicine

## 2021-06-03 ENCOUNTER — Other Ambulatory Visit: Payer: Self-pay

## 2021-06-03 VITALS — BP 102/60 | HR 94 | Temp 97.5°F | Ht 72.0 in | Wt 184.0 lb

## 2021-06-03 DIAGNOSIS — N185 Chronic kidney disease, stage 5: Secondary | ICD-10-CM

## 2021-06-03 DIAGNOSIS — J309 Allergic rhinitis, unspecified: Secondary | ICD-10-CM | POA: Diagnosis not present

## 2021-06-03 DIAGNOSIS — E1136 Type 2 diabetes mellitus with diabetic cataract: Secondary | ICD-10-CM | POA: Diagnosis not present

## 2021-06-03 DIAGNOSIS — C649 Malignant neoplasm of unspecified kidney, except renal pelvis: Secondary | ICD-10-CM | POA: Diagnosis not present

## 2021-06-03 DIAGNOSIS — M5416 Radiculopathy, lumbar region: Secondary | ICD-10-CM | POA: Diagnosis not present

## 2021-06-03 DIAGNOSIS — K521 Toxic gastroenteritis and colitis: Secondary | ICD-10-CM | POA: Diagnosis not present

## 2021-06-03 DIAGNOSIS — I10 Essential (primary) hypertension: Secondary | ICD-10-CM | POA: Diagnosis not present

## 2021-06-03 DIAGNOSIS — N3281 Overactive bladder: Secondary | ICD-10-CM | POA: Diagnosis not present

## 2021-06-03 DIAGNOSIS — H04123 Dry eye syndrome of bilateral lacrimal glands: Secondary | ICD-10-CM | POA: Diagnosis not present

## 2021-06-03 DIAGNOSIS — E1142 Type 2 diabetes mellitus with diabetic polyneuropathy: Secondary | ICD-10-CM | POA: Diagnosis not present

## 2021-06-03 DIAGNOSIS — M205X9 Other deformities of toe(s) (acquired), unspecified foot: Secondary | ICD-10-CM | POA: Diagnosis not present

## 2021-06-03 DIAGNOSIS — E1122 Type 2 diabetes mellitus with diabetic chronic kidney disease: Secondary | ICD-10-CM | POA: Diagnosis not present

## 2021-06-03 DIAGNOSIS — D649 Anemia, unspecified: Secondary | ICD-10-CM | POA: Insufficient documentation

## 2021-06-03 DIAGNOSIS — E785 Hyperlipidemia, unspecified: Secondary | ICD-10-CM | POA: Diagnosis not present

## 2021-06-03 DIAGNOSIS — L259 Unspecified contact dermatitis, unspecified cause: Secondary | ICD-10-CM | POA: Diagnosis not present

## 2021-06-03 DIAGNOSIS — I120 Hypertensive chronic kidney disease with stage 5 chronic kidney disease or end stage renal disease: Secondary | ICD-10-CM | POA: Diagnosis not present

## 2021-06-03 DIAGNOSIS — D508 Other iron deficiency anemias: Secondary | ICD-10-CM | POA: Diagnosis not present

## 2021-06-03 DIAGNOSIS — K219 Gastro-esophageal reflux disease without esophagitis: Secondary | ICD-10-CM | POA: Diagnosis not present

## 2021-06-03 DIAGNOSIS — M1990 Unspecified osteoarthritis, unspecified site: Secondary | ICD-10-CM | POA: Diagnosis not present

## 2021-06-03 DIAGNOSIS — M48061 Spinal stenosis, lumbar region without neurogenic claudication: Secondary | ICD-10-CM | POA: Diagnosis not present

## 2021-06-03 DIAGNOSIS — N179 Acute kidney failure, unspecified: Secondary | ICD-10-CM | POA: Diagnosis not present

## 2021-06-03 DIAGNOSIS — H2510 Age-related nuclear cataract, unspecified eye: Secondary | ICD-10-CM | POA: Diagnosis not present

## 2021-06-03 DIAGNOSIS — G4733 Obstructive sleep apnea (adult) (pediatric): Secondary | ICD-10-CM | POA: Diagnosis not present

## 2021-06-03 LAB — CBC WITH DIFFERENTIAL/PLATELET
Basophils Absolute: 0 10*3/uL (ref 0.0–0.1)
Basophils Relative: 0.7 % (ref 0.0–3.0)
Eosinophils Absolute: 0 10*3/uL (ref 0.0–0.7)
Eosinophils Relative: 0.5 % (ref 0.0–5.0)
HCT: 29.5 % — ABNORMAL LOW (ref 39.0–52.0)
Hemoglobin: 9.9 g/dL — ABNORMAL LOW (ref 13.0–17.0)
Lymphocytes Relative: 10.7 % — ABNORMAL LOW (ref 12.0–46.0)
Lymphs Abs: 0.5 10*3/uL — ABNORMAL LOW (ref 0.7–4.0)
MCHC: 33.6 g/dL (ref 30.0–36.0)
MCV: 90.8 fl (ref 78.0–100.0)
Monocytes Absolute: 0.8 10*3/uL (ref 0.1–1.0)
Monocytes Relative: 15.3 % — ABNORMAL HIGH (ref 3.0–12.0)
Neutro Abs: 3.7 10*3/uL (ref 1.4–7.7)
Neutrophils Relative %: 72.8 % (ref 43.0–77.0)
Platelets: 218 10*3/uL (ref 150.0–400.0)
RBC: 3.25 Mil/uL — ABNORMAL LOW (ref 4.22–5.81)
RDW: 14.8 % (ref 11.5–15.5)
WBC: 5.1 10*3/uL (ref 4.0–10.5)

## 2021-06-03 LAB — IBC + FERRITIN
Ferritin: 220.7 ng/mL (ref 22.0–322.0)
Iron: 57 ug/dL (ref 42–165)
Saturation Ratios: 28.3 % (ref 20.0–50.0)
TIBC: 201.6 ug/dL — ABNORMAL LOW (ref 250.0–450.0)
Transferrin: 144 mg/dL — ABNORMAL LOW (ref 212.0–360.0)

## 2021-06-03 NOTE — Patient Instructions (Signed)
Iron Deficiency Anemia, Adult Iron deficiency anemia is a condition in which the concentration of red blood cells or hemoglobin in the blood is below normal because of too little iron. Hemoglobin is a substance in red blood cells that carries oxygen to the body's tissues. When the concentration of red blood cells or hemoglobin is too low, not enough oxygen reaches these tissues. Iron deficiency anemia is usually long-lasting, and it develops over time. It may or may not cause symptoms. It is a common type of anemia. What are the causes? This condition may be caused by: Not enough iron in the diet. Abnormal absorption in the gut. Increased need for iron because of pregnancy or heavy menstrual periods, for females. Cancers of the gastrointestinal system, such as colon cancer. Blood loss caused by bleeding in the intestine. This may be from a gastrointestinal condition like Crohn's disease. Frequent blood draws, such as from blood donation. What increases the risk? The following factors may make you more likely to develop this condition: Being pregnant. Being a teenage girl going through a growth spurt. What are the signs or symptoms? Symptoms of this condition may include: Pale skin, lips, and nail beds. Weakness, dizziness, and getting tired easily. Headache. Shortness of breath when moving or exercising. Cold hands and feet. Fast or irregular heartbeat. Irritability or rapid breathing. These are more common in severe anemia. Mild anemia may not cause any symptoms. How is this diagnosed? This condition is diagnosed based on: Your medical history. A physical exam. Blood tests. You may have additional tests to find the underlying cause of your anemia, such as: Testing for blood in the stool (fecal occult blood test). A procedure to see inside your colon and rectum (colonoscopy). A procedure to see inside your esophagus and stomach (endoscopy). A test in which cells are removed from  bone marrow (bone marrow aspiration) or fluid is removed from the bone marrow to be examined. This is rarely needed. How is this treated? This condition is treated by correcting the cause of your iron deficiency. Treatment may involve: Adding iron-rich foods to your diet. Taking iron supplements. If you are pregnant or breastfeeding, you may need to take extra iron because your normal diet usually does not provide the amount of iron that you need. Increasing vitamin C intake. Vitamin C helps your body absorb iron. Your health care provider may recommend that you take iron supplements along with a glass of orange juice or a vitamin C supplement. Medicines to make heavy menstrual flow lighter. Surgery. You may need repeat blood tests to determine whether treatment is working. If the treatment does not seem to be working, you may need more tests. Follow these instructions at home: Medicines Take over-the-counter and prescription medicines only as told by your health care provider. This includes iron supplements and vitamins. For the best iron absorption, you should take iron supplements when your stomach is empty. If you cannot tolerate them on an empty stomach, you may need to take them with food. Do not drink milk or take antacids at the same time as your iron supplements. Milk and antacids may interfere with iron absorption. Iron supplements may turn stool (feces) a darker color and it may appear black. If you cannot tolerate taking iron supplements by mouth, talk with your health care provider about taking them through an IV or through an injection into a muscle. Eating and drinking  Talk with your health care provider before changing your diet. He or she may recommend   that you eat foods that contain a lot of iron, such as: Liver. Low-fat (lean) beef. Breads and cereals that have iron added to them (are fortified). Eggs. Dried fruit. Dark green, leafy vegetables. To help your body use the  iron from iron-rich foods, eat those foods at the same time as fresh fruits and vegetables that are high in vitamin C. Foods that are high in vitamin C include: Oranges. Peppers. Tomatoes. Mangoes. Drink enough fluid to keep your urine pale yellow. Managing constipation If you are taking an iron supplement, it may cause constipation. To prevent or treat constipation, you may need to: Take over-the-counter or prescription medicines. Eat foods that are high in fiber, such as beans, whole grains, and fresh fruits and vegetables. Limit foods that are high in fat and processed sugars, such as fried or sweet foods. General instructions Return to your normal activities as told by your health care provider. Ask your health care provider what activities are safe for you. Practice good hygiene. Anemia can make you more prone to illness and infection. Keep all follow-up visits as told by your health care provider. This is important. Contact a health care provider if you: Feel nauseous or you vomit. Feel weak. Have unexplained sweating. Develop symptoms of constipation, such as: Having fewer than three bowel movements a week. Straining to have a bowel movement. Having stools that are hard, dry, or larger than normal. Feeling full or bloated. Pain in the lower abdomen. Not feeling relief after having a bowel movement. Get help right away if you: Faint. If this happens, do not drive yourself to the hospital. Have chest pain. Have shortness of breath that: Is severe. Gets worse with physical activity. Have an irregular or rapid heartbeat. Become light-headed when getting up from a sitting or lying down position. These symptoms may represent a serious problem that is an emergency. Do not wait to see if the symptoms will go away. Get medical help right away. Call your local emergency services (911 in the [place] Do not drive yourself to the hospital. Summary Iron deficiency anemia is a condition in  which the concentration of red blood cells or hemoglobin in the blood is below normal because of too little iron. This condition is treated by correcting the cause of your iron deficiency. Take over-the-counter and prescription medicines only as told by your health care provider. This includes iron supplements and vitamins. To help your body use the iron from iron-rich foods, eat those foods at the same time as fresh fruits and vegetables that are high in vitamin C. Get help right away if you have shortness of breath that gets worse with physical activity. This information is not intended to replace advice given to you by your health care provider. Make sure you discuss any questions you have with your health care provider. Document Revised: 01/07/2019 Document Reviewed: 01/07/2019 Elsevier Patient Education  2022 Elsevier Inc.  

## 2021-06-03 NOTE — Progress Notes (Signed)
Subjective:  Patient ID: Antonio Adams, male    DOB: Jun 01, 1945  Age: 76 y.o. MRN: [id number]  CC: Hospitalization Follow-up and Anemia  This visit occurred during the SARS-CoV-2 public health emergency.  Safety protocols were in place, including screening questions prior to the visit, additional usage of staff PPE, and extensive cleaning of exam room while observing appropriate contact time as indicated for disinfecting solutions.    HPI Antonio Adams presents for f/up -  He was recently admitted for diarrhea.  His bowel movements are back to normal now.  He has about 3 formed bowel movements a day.  He continues to complain of shortness of breath but denies chest pain, cough, abdominal pain, nausea, vomiting, or weight loss.  PCP: [name], MD   Admit date: 05/23/2021 Discharge date: 05/29/2021   Time spent: 35 minutes.   Recommendations for Outpatient Follow-up:  Follow-up with your medical oncologist within a week.  Follow-up with GI within a week. Follow-up with your primary care provider in 1 to 2 weeks. Take your medications as prescribed. Continue PT OT with assistance and fall precautions.   Discharge Diagnoses:      Active Hospital Problems    Diagnosis Date Noted   Decreased hemoglobin     Drug-induced colitis     Metabolic acidosis 29/93/7169   Dehydration 05/24/2021   CKD (chronic kidney disease) stage 5, GFR less than 15 ml/min (HCC) 05/24/2021   AKI (acute kidney injury) ([place]) 05/24/2021   BPH (benign prostatic hyperplasia) 05/24/2021   Metastatic renal cell carcinoma ([place]) 02/26/2020   Essential hypertension 03/16/2007   Type 2 diabetes mellitus with diabetic cataract ([place]) 03/16/2007     Outpatient Medications Prior to Visit  Medication Sig Dispense Refill   atorvastatin (LIPITOR) 20 MG tablet Take 1 tablet (20 mg total) by mouth at bedtime. 30 tablet 0   ferrous sulfate 325 (65 FE) MG tablet Take 1 tablet (325 mg total) by mouth 2  (two) times daily. Patient needs office visit for further refills 60 tablet 1   fluocinonide-emollient (LIDEX-E) 0.05 % cream Apply 1 application topically 2 (two) times daily. 60 g 1   gabapentin (NEURONTIN) 100 MG capsule Take 100 mg by mouth at bedtime.     gemfibrozil (LOPID) 600 MG tablet Take 600 mg by mouth 2 (two) times daily before a meal.     Insulin Pen Needle 32G X 6 MM MISC 1 Act by Does not apply route daily. 100 each 1   LOKELMA 10 g PACK packet Take 10 g by mouth every Monday, Wednesday, and Friday.     loperamide (IMODIUM) 2 MG capsule Take 1 capsule (2 mg total) by mouth as needed for up to 5 days for diarrhea or loose stools. 10 capsule 0   metoCLOPramide (REGLAN) 10 MG tablet Take 1 tablet (10 mg total) by mouth 4 (four) times daily -  before meals and at bedtime for 5 days. 20 tablet 0   Multiple Vitamin (MULTIVITAMIN WITH MINERALS) TABS tablet Take 1 tablet by mouth daily. 30 tablet 0   Nutritional Supplements (,FEEDING SUPPLEMENT, PROSOURCE PLUS) liquid Take 30 mLs by mouth 2 (two) times daily between meals for 7 days. 420 mL 0   Omega-3 Fatty Acids (FISH OIL PO) Take 1 tablet by mouth daily.     ONE TOUCH ULTRA TEST test strip USE 2 (TWO) TIMES DAILY. AND LANCETS 2/DAY 250.03 100 each 11   OVER THE COUNTER MEDICATION Take 1 tablet by  mouth daily. Focus factor     polyvinyl alcohol (LIQUIFILM TEARS) 1.4 % ophthalmic solution Place 1 drop into both eyes as needed for dry eyes.     solifenacin (VESICARE) 5 MG tablet TAKE 1 TABLET (5 MG TOTAL) BY MOUTH DAILY. 30 tablet 5   tamsulosin (FLOMAX) 0.4 MG CAPS capsule TAKE 1 CAPSULE (0.4 MG TOTAL) BY MOUTH DAILY. (Patient taking differently: Take 0.4 mg by mouth daily.) 30 capsule 1   aspirin EC 81 MG tablet Take 81 mg by mouth daily. Swallow whole.     cholecalciferol (VITAMIN D) 1000 units tablet Take 1,000 Units by mouth daily.     Continuous Blood Gluc Receiver (FREESTYLE LIBRE 14 DAY READER) DEVI 1 Act by Does not apply route  daily. 2 each 5   Continuous Blood Gluc Sensor (FREESTYLE LIBRE 2 SENSOR) MISC 1 Act by Does not apply route daily. 2 each 5   No facility-administered medications prior to visit.    ROS Review of Systems  Constitutional:  Positive for appetite change and fatigue. Negative for chills, diaphoresis and unexpected weight change.  HENT: Negative.    Eyes: Negative.   Respiratory:  Positive for shortness of breath. Negative for cough, chest tightness and wheezing.   Cardiovascular:  Negative for chest pain, palpitations and leg swelling.  Gastrointestinal:  Negative for abdominal pain, constipation, diarrhea and vomiting.  Endocrine: Negative.   Genitourinary: Negative.  Negative for difficulty urinating.  Musculoskeletal: Negative.   Skin: Negative.   Neurological:  Negative for dizziness, weakness and light-headedness.  Hematological:  Negative for adenopathy. Does not bruise/bleed easily.  Psychiatric/Behavioral: Negative.     Objective:  BP 102/60    Pulse 94    Temp (!) 97.5 F (36.4 C) (Oral)    Ht 6' (1.829 m)    Wt 184 lb (83.5 kg)    SpO2 96%    BMI 24.95 kg/m   BP Readings from Last 3 Encounters:  06/03/21 102/60  05/29/21 (!) 97/55  05/20/21 117/75    Wt Readings from Last 3 Encounters:  06/03/21 184 lb (83.5 kg)  05/23/21 186 lb (84.4 kg)  05/20/21 186 lb (84.4 kg)    Physical Exam Vitals reviewed.  Constitutional:      Appearance: He is ill-appearing (in a wheelchair).  HENT:     Nose: Nose normal.     Mouth/Throat:     Mouth: Mucous membranes are moist.  Eyes:     General: No scleral icterus.    Conjunctiva/sclera: Conjunctivae normal.  Cardiovascular:     Rate and Rhythm: Normal rate and regular rhythm.     Heart sounds: No murmur heard. Pulmonary:     Effort: Pulmonary effort is normal.     Breath sounds: No stridor. No wheezing, rhonchi or rales.  Abdominal:     General: Abdomen is flat.     Palpations: There is no mass.     Tenderness: There is  no abdominal tenderness. There is no guarding.     Hernia: No hernia is present.  Musculoskeletal:        General: Normal range of motion.     Cervical back: Neck supple.     Right lower leg: No edema.     Left lower leg: No edema.  Skin:    General: Skin is warm and dry.  Neurological:     General: No focal deficit present.     Mental Status: He is alert.  Psychiatric:  Mood and Affect: Mood normal.    Lab Results  Component Value Date   WBC 5.1 06/03/2021   HGB 9.9 (L) 06/03/2021   HCT 29.5 (L) 06/03/2021   PLT 218.0 06/03/2021   GLUCOSE 77 05/29/2021   CHOL 176 12/02/2020   TRIG 86 12/02/2020   HDL 51 12/02/2020   LDLCALC 108 12/02/2020   ALT 47 (H) 05/29/2021   AST 47 (H) 05/29/2021   NA 137 05/29/2021   K 4.2 05/29/2021   CL 109 05/29/2021   CREATININE 4.06 (H) 05/29/2021   BUN 34 (H) 05/29/2021   CO2 20 (L) 05/29/2021   TSH 0.848 04/20/2021   PSA 0.01 (L) 05/20/2020   INR 1.2 05/24/2021   HGBA1C 5.8 (H) 05/23/2021   MICROALBUR 97.0 12/18/2019    CT ABDOMEN PELVIS WO CONTRAST  Result Date: 05/23/2021 CLINICAL DATA:  76 year old male with nausea vomiting and diarrhea. History of recurrent kidney cancer status post surgery and ablation of right liver metastasis. EXAM: CT ABDOMEN AND PELVIS WITHOUT CONTRAST TECHNIQUE: Multidetector CT imaging of the abdomen and pelvis was performed following the standard protocol without IV contrast. COMPARISON:  Noncontrast restaging CT Chest, Abdomen, and Pelvis 05/06/2021. FINDINGS: Lower chest: Mildly improved lung volumes and ventilation. Mild lung base atelectasis and borderline to mild bronchiectasis. Calcified coronary artery atherosclerosis and/or stents. No pericardial or pleural effusion. Hepatobiliary: Stable noncontrast post treatment appearance of the liver. Gallbladder diminutive or absent. Pancreas: Negative. Spleen: Diminutive, negative. Adrenals/Urinary Tract: Unchanged noncontrast appearance of the right renal  fossa since last month including 2.7 cm soft tissue nodule arising from or adjacent to the right adrenal gland on series 2, image 26. Architectural distortion and more since pouch, stable surgical clips and partial right nephrectomy appearance. Contralateral left adrenal gland and left kidney appears stable and negative. No acute hydronephrosis or hydroureter. Diminutive, unremarkable bladder. Stomach/Bowel: Fluid mildly distending the large bowel from the splenic flexure distally. Similar fluid in the ascending colon. Redundant large bowel. Normal appendix series 2, image 65. No discrete large bowel inflammation. Mild small bowel fluid. No dilated small bowel loops. Stomach and duodenum appear stable and within normal limits. No free air, free fluid. Vascular/Lymphatic: Mildly tortuous aorta. Mild Aortoiliac calcified atherosclerosis. No lymphadenopathy identified. Reproductive: Prostate brachytherapy. Other: No pelvic free fluid. Musculoskeletal: Stable visualized osseous structures. Somewhat heterogeneous bone mineralization but no destructive osseous lesion identified. IMPRESSION: 1. Fluid throughout the large bowel compatible with diarrhea. Normal appendix, and no discrete bowel inflammation or evidence of bowel obstruction. 2. Otherwise stable noncontrast CT appearance of the abdomen and pelvis since the restaging CTs last month, including the abnormal right renal space (please see that report). 3. Aortic Atherosclerosis (ICD10-I70.0). Electronically Signed   By: [name] M.D.   On: 05/23/2021 06:12   DG Chest Portable 1 View  Result Date: 05/23/2021 CLINICAL DATA:  Hypotension. EXAM: PORTABLE CHEST 1 VIEW COMPARISON:  05/20/2021. FINDINGS: The heart size and mediastinal contours are within normal limits. Both lungs are clear. No acute osseous abnormality. IMPRESSION: No acute cardiopulmonary process. Electronically Signed   By: Brett Fairy M.D.   On: 05/23/2021 04:53    Assessment & Plan:   Khalif  was seen today for hospitalization follow-up and anemia.  Diagnoses and all orders for this visit:  Other iron deficiency anemia- His H&H are stable and his iron level is normal.  Will continue the oral iron supplement. -     IBC + Ferritin; Future -     CBC with Differential/Platelet;  Future -     CBC with Differential/Platelet -     IBC + Ferritin  Essential hypertension- His blood pressure is well controlled.  Diabetic polyneuropathy associated with type 2 diabetes mellitus ([place])- His blood sugar is very well controlled.  CKD (chronic kidney disease) stage 5, GFR less than 15 ml/min (HCC)- He has an appointment next week with nephrology.   I have discontinued Otila Back. Sacco's cholecalciferol, aspirin EC, FreeStyle Libre 14 Day Reader, and [employer]. I am also having him maintain his tamsulosin, ONE TOUCH ULTRA TEST, ferrous sulfate, gabapentin, gemfibrozil, Insulin Pen Needle, polyvinyl alcohol, Omega-3 Fatty Acids (FISH OIL PO), OVER THE COUNTER MEDICATION, fluocinonide-emollient, Lokelma, atorvastatin, multivitamin with minerals, (feeding supplement) PROSource Plus, metoCLOPramide, and solifenacin.  No orders of the defined types were placed in this encounter.    Follow-up: Return if symptoms worsen or fail to improve.  Scarlette Calico, MD

## 2021-06-06 ENCOUNTER — Telehealth: Payer: Self-pay | Admitting: Internal Medicine

## 2021-06-06 ENCOUNTER — Other Ambulatory Visit: Payer: Self-pay | Admitting: Internal Medicine

## 2021-06-06 DIAGNOSIS — T7840XA Allergy, unspecified, initial encounter: Secondary | ICD-10-CM

## 2021-06-06 DIAGNOSIS — L2084 Intrinsic (allergic) eczema: Secondary | ICD-10-CM

## 2021-06-06 NOTE — Telephone Encounter (Signed)
Home Health verbal orders-caller/Agency: [facility] [place] number: [phone] (secure vm)  Requesting OT/PT/Skilled nursing/Social Work/Speech: PT  Start Date: 06-03-2021  Frequency: 1w1 2w3 1w1

## 2021-06-06 NOTE — Telephone Encounter (Signed)
Verbal orders given to [facility]

## 2021-06-08 DIAGNOSIS — E1142 Type 2 diabetes mellitus with diabetic polyneuropathy: Secondary | ICD-10-CM | POA: Diagnosis not present

## 2021-06-08 DIAGNOSIS — G4733 Obstructive sleep apnea (adult) (pediatric): Secondary | ICD-10-CM | POA: Diagnosis not present

## 2021-06-08 DIAGNOSIS — E1122 Type 2 diabetes mellitus with diabetic chronic kidney disease: Secondary | ICD-10-CM | POA: Diagnosis not present

## 2021-06-08 DIAGNOSIS — K521 Toxic gastroenteritis and colitis: Secondary | ICD-10-CM | POA: Diagnosis not present

## 2021-06-08 DIAGNOSIS — K219 Gastro-esophageal reflux disease without esophagitis: Secondary | ICD-10-CM | POA: Diagnosis not present

## 2021-06-08 DIAGNOSIS — M48061 Spinal stenosis, lumbar region without neurogenic claudication: Secondary | ICD-10-CM | POA: Diagnosis not present

## 2021-06-08 DIAGNOSIS — L259 Unspecified contact dermatitis, unspecified cause: Secondary | ICD-10-CM | POA: Diagnosis not present

## 2021-06-08 DIAGNOSIS — H2510 Age-related nuclear cataract, unspecified eye: Secondary | ICD-10-CM | POA: Diagnosis not present

## 2021-06-08 DIAGNOSIS — M205X9 Other deformities of toe(s) (acquired), unspecified foot: Secondary | ICD-10-CM | POA: Diagnosis not present

## 2021-06-08 DIAGNOSIS — J309 Allergic rhinitis, unspecified: Secondary | ICD-10-CM | POA: Diagnosis not present

## 2021-06-08 DIAGNOSIS — N179 Acute kidney failure, unspecified: Secondary | ICD-10-CM | POA: Diagnosis not present

## 2021-06-08 DIAGNOSIS — H04123 Dry eye syndrome of bilateral lacrimal glands: Secondary | ICD-10-CM | POA: Diagnosis not present

## 2021-06-08 DIAGNOSIS — M5416 Radiculopathy, lumbar region: Secondary | ICD-10-CM | POA: Diagnosis not present

## 2021-06-08 DIAGNOSIS — N3281 Overactive bladder: Secondary | ICD-10-CM | POA: Diagnosis not present

## 2021-06-08 DIAGNOSIS — E785 Hyperlipidemia, unspecified: Secondary | ICD-10-CM | POA: Diagnosis not present

## 2021-06-08 DIAGNOSIS — I120 Hypertensive chronic kidney disease with stage 5 chronic kidney disease or end stage renal disease: Secondary | ICD-10-CM | POA: Diagnosis not present

## 2021-06-08 DIAGNOSIS — N185 Chronic kidney disease, stage 5: Secondary | ICD-10-CM | POA: Diagnosis not present

## 2021-06-08 DIAGNOSIS — E1136 Type 2 diabetes mellitus with diabetic cataract: Secondary | ICD-10-CM | POA: Diagnosis not present

## 2021-06-08 DIAGNOSIS — M1990 Unspecified osteoarthritis, unspecified site: Secondary | ICD-10-CM | POA: Diagnosis not present

## 2021-06-08 DIAGNOSIS — C649 Malignant neoplasm of unspecified kidney, except renal pelvis: Secondary | ICD-10-CM | POA: Diagnosis not present

## 2021-06-09 DIAGNOSIS — N3281 Overactive bladder: Secondary | ICD-10-CM | POA: Diagnosis not present

## 2021-06-09 DIAGNOSIS — E1136 Type 2 diabetes mellitus with diabetic cataract: Secondary | ICD-10-CM | POA: Diagnosis not present

## 2021-06-09 DIAGNOSIS — L259 Unspecified contact dermatitis, unspecified cause: Secondary | ICD-10-CM | POA: Diagnosis not present

## 2021-06-09 DIAGNOSIS — N179 Acute kidney failure, unspecified: Secondary | ICD-10-CM | POA: Diagnosis not present

## 2021-06-09 DIAGNOSIS — M1990 Unspecified osteoarthritis, unspecified site: Secondary | ICD-10-CM | POA: Diagnosis not present

## 2021-06-09 DIAGNOSIS — H2510 Age-related nuclear cataract, unspecified eye: Secondary | ICD-10-CM | POA: Diagnosis not present

## 2021-06-09 DIAGNOSIS — K219 Gastro-esophageal reflux disease without esophagitis: Secondary | ICD-10-CM | POA: Diagnosis not present

## 2021-06-09 DIAGNOSIS — I120 Hypertensive chronic kidney disease with stage 5 chronic kidney disease or end stage renal disease: Secondary | ICD-10-CM | POA: Diagnosis not present

## 2021-06-09 DIAGNOSIS — E785 Hyperlipidemia, unspecified: Secondary | ICD-10-CM | POA: Diagnosis not present

## 2021-06-09 DIAGNOSIS — M205X9 Other deformities of toe(s) (acquired), unspecified foot: Secondary | ICD-10-CM | POA: Diagnosis not present

## 2021-06-09 DIAGNOSIS — C649 Malignant neoplasm of unspecified kidney, except renal pelvis: Secondary | ICD-10-CM | POA: Diagnosis not present

## 2021-06-09 DIAGNOSIS — M48061 Spinal stenosis, lumbar region without neurogenic claudication: Secondary | ICD-10-CM | POA: Diagnosis not present

## 2021-06-09 DIAGNOSIS — J309 Allergic rhinitis, unspecified: Secondary | ICD-10-CM | POA: Diagnosis not present

## 2021-06-09 DIAGNOSIS — E1142 Type 2 diabetes mellitus with diabetic polyneuropathy: Secondary | ICD-10-CM | POA: Diagnosis not present

## 2021-06-09 DIAGNOSIS — M5416 Radiculopathy, lumbar region: Secondary | ICD-10-CM | POA: Diagnosis not present

## 2021-06-09 DIAGNOSIS — N185 Chronic kidney disease, stage 5: Secondary | ICD-10-CM | POA: Diagnosis not present

## 2021-06-09 DIAGNOSIS — H04123 Dry eye syndrome of bilateral lacrimal glands: Secondary | ICD-10-CM | POA: Diagnosis not present

## 2021-06-09 DIAGNOSIS — E1122 Type 2 diabetes mellitus with diabetic chronic kidney disease: Secondary | ICD-10-CM | POA: Diagnosis not present

## 2021-06-09 DIAGNOSIS — K521 Toxic gastroenteritis and colitis: Secondary | ICD-10-CM | POA: Diagnosis not present

## 2021-06-09 DIAGNOSIS — G4733 Obstructive sleep apnea (adult) (pediatric): Secondary | ICD-10-CM | POA: Diagnosis not present

## 2021-06-13 ENCOUNTER — Ambulatory Visit (INDEPENDENT_AMBULATORY_CARE_PROVIDER_SITE_OTHER): Payer: Medicare Other | Admitting: *Deleted

## 2021-06-13 DIAGNOSIS — N185 Chronic kidney disease, stage 5: Secondary | ICD-10-CM

## 2021-06-13 DIAGNOSIS — E118 Type 2 diabetes mellitus with unspecified complications: Secondary | ICD-10-CM

## 2021-06-13 DIAGNOSIS — I10 Essential (primary) hypertension: Secondary | ICD-10-CM

## 2021-06-13 NOTE — Chronic Care Management (AMB) (Signed)
Chronic Care Management   CCM Antonio Adams Visit Note  06/13/2021 Name: Antonio Adams MRN: [id number] DOB: 1946/01/25  Subjective: Antonio Adams is a 76 y.o. year old male who is a primary care patient of [name], MD. The care management team was consulted for assistance with disease management and care coordination needs.    Engaged with patient by telephone for  acute/ unscheduled outreach, post- recent hospitalization  in response to provider referral for case management and/or care coordination services.   Consent to Services:  The patient was given information about Chronic Care Management services, agreed to services, and gave verbal consent prior to initiation of services.  Please see initial visit note for detailed documentation.  Patient agreed to services and verbal consent obtained.   Assessment: Review of patient past medical history, allergies, medications, health status, including review of consultants reports, laboratory and other test data, was performed as part of comprehensive evaluation and provision of chronic care management services.   SDOH (Social Determinants of Health) assessments and interventions performed:  SDOH Interventions    Flowsheet Row Most Recent Value  SDOH Interventions   Food Insecurity Interventions Intervention Not Indicated  [denies food insecurity]  Housing Interventions Intervention Not Indicated  [continues to live alone]  Transportation Interventions Intervention Not Indicated  [Patient drives self,  family assists if/ as indicated]       CCM Care Plan  Allergies  Allergen Reactions   Ace Inhibitors Swelling and Other (See Comments)    lisinopril   Opdivo [Nivolumab] Rash   Outpatient Encounter Medications as of 06/13/2021  Medication Sig   atorvastatin (LIPITOR) 20 MG tablet Take 1 tablet (20 mg total) by mouth at bedtime.   ferrous sulfate 325 (65 FE) MG tablet Take 1 tablet (325 mg total) by mouth 2 (two) times daily.  Patient needs office visit for further refills   fluocinonide-emollient (LIDEX-E) 0.05 % cream APPLY TO AFFECTED AREA TWICE A DAY   gabapentin (NEURONTIN) 100 MG capsule Take 100 mg by mouth at bedtime.   gemfibrozil (LOPID) 600 MG tablet Take 600 mg by mouth 2 (two) times daily before a meal.   Insulin Pen Needle 32G X 6 MM MISC 1 Act by Does not apply route daily.   LOKELMA 10 g PACK packet Take 10 g by mouth every Monday, Wednesday, and Friday.   metoCLOPramide (REGLAN) 10 MG tablet Take 1 tablet (10 mg total) by mouth 4 (four) times daily -  before meals and at bedtime for 5 days.   Multiple Vitamin (MULTIVITAMIN WITH MINERALS) TABS tablet Take 1 tablet by mouth daily.   Omega-3 Fatty Acids (FISH OIL PO) Take 1 tablet by mouth daily.   ONE TOUCH ULTRA TEST test strip USE 2 (TWO) TIMES DAILY. AND LANCETS 2/DAY 250.03   OVER THE COUNTER MEDICATION Take 1 tablet by mouth daily. Focus factor   polyvinyl alcohol (LIQUIFILM TEARS) 1.4 % ophthalmic solution Place 1 drop into both eyes as needed for dry eyes.   solifenacin (VESICARE) 5 MG tablet TAKE 1 TABLET (5 MG TOTAL) BY MOUTH DAILY.   tamsulosin (FLOMAX) 0.4 MG CAPS capsule TAKE 1 CAPSULE (0.4 MG TOTAL) BY MOUTH DAILY. (Patient taking differently: Take 0.4 mg by mouth daily.)   No facility-administered encounter medications on file as of 06/13/2021.   Patient Active Problem List   Diagnosis Date Noted   Absolute anemia 06/03/2021   Drug-induced colitis    CKD (chronic kidney disease) stage 5, GFR less than 15  ml/min ([place]) 05/24/2021   BPH (benign prostatic hyperplasia) 05/24/2021   Dental caries on smooth surface penetrating into pulp 05/20/2021   Deposits (accretions) on teeth 05/20/2021   Diabetic renal disease ([place]) 05/20/2021   Dry eye syndrome of bilateral lacrimal glands 05/20/2021   Osteoarthrosis 05/20/2021   Other acquired deformity of toe 05/20/2021   Other specified disorder of rectum and anus 05/20/2021   Problem related to  unspecified psychosocial circumstances 05/20/2021   Senile nuclear sclerosis 05/20/2021   Vitamin D deficiency 05/20/2021   Vitreous degeneration, unspecified eye 05/20/2021   Intrinsic eczema 04/18/2021   Atherosclerosis of aorta ([place]) 12/30/2020   Mixed conductive and sensorineural hearing loss of left ear with restricted hearing of right ear 09/01/2020   Tinnitus of both ears 05/20/2020   Metastatic renal cell carcinoma ([place]) 02/26/2020   Diabetic polyneuropathy associated with type 2 diabetes mellitus ([place]) 12/18/2019   Murmur, cardiac 10/08/2018   DDD (degenerative disc disease), cervical 10/08/2018   OAB (overactive bladder) 04/04/2018   Seasonal allergic rhinitis due to pollen 11/01/2016   Microalbuminuria due to type 2 diabetes mellitus ([place]) 07/27/2016   Varicose veins of both lower extremities 08/06/2015   Varicose veins of lower extremities with ulcer ([place]) 06/16/2015   Internal hemorrhoid, bleeding 08/03/2014   Post traumatic stress disorder (PTSD) 01/29/2014   Chronic gingivitis, plaque induced 06/04/2012   Spinal stenosis of lumbar region with radiculopathy 10/30/2011    Class: Chronic   Obstructive sleep apnea 11/24/2010   Obesity 11/24/2010   Routine general medical examination at a health care facility 11/24/2010   ADENOCARCINOMA, PROSTATE, GLEASON GRADE 6 12/21/2009   Type 2 diabetes mellitus with diabetic cataract ([place]) 03/16/2007   Other and unspecified hyperlipidemia 03/16/2007   Essential hypertension 03/16/2007   GERD 03/16/2007   Conditions to be addressed/monitored:  HTN and DMII  Care Plan : Antonio Adams Care Manager Plan of Care  Updates made by Antonio Royalty, Antonio Adams since 06/13/2021 12:00 AM     Problem: Chronic Disease Management Needs   Priority: Medium     Long-Range Goal: Ongoing adherence to established plan of care for long term chronic disease management   Start Date: 02/07/2021  Expected End Date: 02/07/2022  Priority: Medium  Note:   Current  Barriers:  Chronic Disease Management support and education needs related to HTN and DMII Recent hospitalization January 9-15, 2023 for dehydration due to N/V/D: discharged with home health services through [facility]): Care plan goals re-established and extended 02/07/21: Patient will demonstrate ongoing health management independence for HTN, DMII  through collaboration with Antonio Adams Care manager, provider, and care team.   Interventions: 1:1 collaboration with primary care provider regarding development and update of comprehensive plan of care as evidenced by provider attestation and co-signature Inter-disciplinary care team collaboration (see longitudinal plan of care) Evaluation of current treatment plan related to  self management and patient's adherence to plan as established by provider 06/13/21- reviewed recent hospitalization with patient; he reports "doing much better;" states he is "pretty much back to normal;" confirmed home health services active, patient reports "may not need for much longer, doing so well."  Patient denies concerns post-hospitalization and verbalizes good general understanding of post- hospital discharge instructions Reviewed post-hospital PCP office visit 06/03/21- he denies questions Confirmed patient is aware of/ planning to attend GI provider office visit tomorrow SDOH updated: no new concerns/ unmet needs identified Pain assessment completed: denies acute/ chronic pain today Falls assessment completed- no new/ recent falls; currently  not using assistive devices; positive reinforcement provided with encouragement to continue efforts; previously provided fall prevention education reinforced Confirmed no medication concerns, continues to self-manage medications Reviewed most recent (updated) A1-C: 5.8 (05/23/21, while hospitalized): patient has not been consistently monitoring blood sugars at home; he was encouraged to resume weekly blood sugar  monitoring, several times per week; we reviewed previously provided education around signs/ symptoms hypoglycemia-- he denies all and verbalizes a good general understanding of same; explained risks of blood sugars falling low-- he will benefit from ongoing reinforcement of same Confirmed patient taking effort to remain hydrated post- recent hospitalization: education provided around signs/ symptoms dehydration, corresponding action plan  From previous encounter 05/11/22  Diabetes:  (Status: Goal on Track (progressing): YES.) Lab Results  Component Value Date   HGBA1C 5.8 (H) 05/23/2021  Assessed patient's understanding of A1c goal: <6.5% Provided education to patient about basic DM disease process; Reviewed prescribed diet with patient low carbohydrate, low sugar, heart healthy, low salt- confirmed patient following diet "as best as I can" ; Counseled on importance of regular laboratory monitoring as prescribed;        Discussed plans with patient for ongoing care management follow up and provided patient with direct contact information for care management team;      Reviewed scheduled/upcoming provider appointments including: oncology provider 05/17/20; provided patient with dates/ times of scheduled appointments;         Review of patient status, including review of consultants reports, relevant laboratory and other test results, and medications completed;       Reviewed with patient recent fasting blood sugars at home: he reports general consistent values between 100-130; he denies lows < 80; denies signs/ symptoms hypoglycemia Re- assessed patient's understanding of meaning/ significance of A1-C values: good baseline understanding of same- Reviewed individual historical A1-C trends and provided education around correlation of A1-C value to blood sugar levels at home over 3 months-- he continues to benefit from ongoing support/ reinforcement of self-health management of DMII Discussed recent  visit to renal provider: patient continues to report trying to avoid hemodialysis in future; we discussed how self-management of HTN/ DMII will impact kidney function, and I encouraged patient to continue his efforts in self-management of DMII/ HTN Confirmed no medication concerns- patient continues self-managing medications Reviewed previously provided education around self-health management of DM and HTN at home: patient will benefit from ongoing reinforcement/ support  Hypertension: (Status: Goal on Track (progressing): YES.) Last practice recorded BP readings:  BP Readings from Last 3 Encounters:  04/20/21 (!) 152/71  04/18/21 132/82  03/22/21 (!) 159/88  Most recent eGFR/CrCl: No results found for: EGFR  No components found for: CRCL  Evaluation of current treatment plan related to hypertension self management and patient's adherence to plan as established by provider;   Discussed plans with patient for ongoing care management follow up and provided patient with direct contact information for care management team; Advised patient to discuss ongoing previously reported rash from medication reaction with provider; Discussed complications of poorly controlled blood pressure such as heart disease, stroke, circulatory complications, vision complications, kidney impairment, sexual dysfunction;  Assessed social determinant of health barriers;  Confirmed patient continues monitoring/ writing down on paper blood pressures at home, weekly Reviewed recent blood pressures at home with patient: reports consistent values between 130-160/ 74-88; reports blood pressure this week at: "130/80" Reviewed recent PCP office visit 04/18/21 with patient; he denies questions, reports rash from medication reaction is "a little better," but also tells  me he did not take prescribed prednisone "exactly" as prescribed: reported that he missed a few days, but is now almost done with dose-pack: provided education around  purpose of prednisone, and shared that his missed doses may be why the rash has not yet completely resolved; confirmed that he has one prescription refill of the topical prescription from 04/18/21 office visit: patient will re-fill with outpatient pharmacy as needed- encouraged patient to schedule another office visit with PCP should rash not resolve promptly, he verbalizes understanding and agreement with plan Confirmed patient continues to live by himself, remains independent in ADL's and iADL's Confirmed patient has had no recent new falls since our last outreach 02/07/21- positive reinforcement provided: encouraged patient to stay safe/ cognizant of fall risks and to continue using cane as needed/ indicated: he reports today does not need cane "very often"  Patient Goals/Self-Care Activities: As evidenced by review of EHR, collaboration with care team, and patient reporting during CCM Antonio Adams CM outreach,  Patient [place] will: Take medications as prescribed   Attend all scheduled provider appointments  Call pharmacy for medication refills  Call provider office for new concerns or questions  Continue to check blood sugars at home: continue writing the values down in your notebook: we will review during each of our telephone calls Take the blood sugar log to all doctor visits- this will help your doctor know which medications you should be on and how your medications are working  Continue your efforts to follow a low sugar, low-carbohydrate, heart healthy, low salt diet Continue to check blood pressures at home and write them down on paper  Keep up the great work to prevent falls; continue to use your cane as needed Continue working with the home health team as long as they are involved in your care Stay as active as you can without over-doing activity    Plan: Telephone follow up appointment with care management team member scheduled for:  Monday, July 11, 2021 at 9:45 am The patient has been  provided with contact information for the care management team and has been advised to call with any health related questions or concerns  Oneta Rack, Antonio Adams, Antonio Adams, [place] [phone]: direct office

## 2021-06-13 NOTE — Patient Instructions (Addendum)
Visit [place], thank you for taking time to talk with me today. Please don't hesitate to contact me if I can be of assistance to you before our next scheduled telephone appointment.  Below are the goals we discussed today:  Patient Self-Care Activities: Patient Antonio Adams will: Take medications as prescribed   Attend all scheduled provider appointments  Call pharmacy for medication refills  Call provider office for new concerns or questions  Continue to check blood sugars at home: continue writing the values down in your notebook: we will review during each of our telephone calls Take the blood sugar log to all doctor visits- this will help your doctor know which medications you should be on and how your medications are working  Continue your efforts to follow a low sugar, low-carbohydrate, heart healthy, low salt diet Continue to check blood pressures at home and write them down on paper  Keep up the great work to prevent falls; continue to use your cane as needed Continue working with the home health team as long as they are involved in your care Stay as active as you can without over-doing activity  Our next scheduled telephone follow up visit/ appointment with care management team member is scheduled on:   Monday, July 11, 2021 at 9:45 am- This is a PHONE [place] appointment  If you need to cancel or re-schedule our visit, please call [phone] and our care guide team will be happy to assist you.   I look forward to hearing about your progress.   Oneta Rack, RN, BSN, [place] [phone]: direct office  If you are experiencing a Mental Health or [place] or need someone to talk to, please  call the Suicide and Crisis Lifeline: 988 call the [place] National Suicide Prevention Lifeline: 6824576336 or TTY: (479)359-6713 TTY 902 345 9988) to talk to a trained counselor call  1-800-273-TALK (toll free, 24 hour hotline) go to [facility] [address], [place] [phone]) call 911   The patient verbalized understanding of instructions, educational materials, and care plan provided today and agreed to receive a mailed copy of patient instructions, educational materials, and care plan  Dehydration, Adult Dehydration is condition in which there is not enough water or other fluids in the body. This happens when a person loses more fluids than he or she takes in. Important body parts cannot work right without the right amount of fluids. Any loss of fluids from the body can cause dehydration. Dehydration can be mild, worse, or very bad. It should be treated right away to keep it from getting very bad. What are the causes? This condition may be caused by: Conditions that cause loss of water or other fluids, such as: Watery poop (diarrhea). Vomiting. Sweating a lot. Peeing (urinating) a lot. Not drinking enough fluids, especially when you: Are ill. Are doing things that take a lot of energy to do. Other illnesses and conditions, such as fever or infection. Certain medicines, such as medicines that take extra fluid out of the body (diuretics). Lack of safe drinking water. Not being able to get enough water and food. What increases the risk? The following factors may make you more likely to develop this condition: Having a long-term (chronic) illness that has not been treated the right way, such as: Diabetes. Heart disease. Kidney disease. Being 62 years of age or older. Having a disability. Living in a place that is high  above the ground or sea (high in altitude). The thinner, dried air causes more fluid loss. Doing exercises that put stress on your body for a long time. What are the signs or symptoms? Symptoms of dehydration depend on how bad it is. Mild or worse dehydration Thirst. Dry lips or dry mouth. Feeling  dizzy or light-headed, especially when you stand up from sitting. Muscle cramps. Your body making: Dark pee (urine). Pee may be the color of tea. Less pee than normal. Less tears than normal. Headache. Very bad dehydration Changes in skin. Skin may: Be cold to the touch (clammy). Be blotchy or pale. Not go back to normal right after you lightly pinch it and let it go. Little or no tears, pee, or sweat. Changes in vital signs, such as: Fast breathing. Low blood pressure. Weak pulse. Pulse that is more than 100 beats a minute when you are sitting still. Other changes, such as: Feeling very thirsty. Eyes that look hollow (sunken). Cold hands and feet. Being mixed up (confused). Being very tired (lethargic) or having trouble waking from sleep. Short-term weight loss. Loss of consciousness. How is this treated? Treatment for this condition depends on how bad it is. Treatment should start right away. Do not wait until your condition gets very bad. Very bad dehydration is an emergency. You will need to go to a hospital. Mild or worse dehydration can be treated at home. You may be asked to: Drink more fluids. Drink an oral rehydration solution (ORS). This drink helps get the right amounts of fluids and salts and minerals in the blood (electrolytes). Very bad dehydration can be treated: With fluids through an IV tube. By getting normal levels of salts and minerals in your blood. This is often done by giving salts and minerals through a tube. The tube is passed through your nose and into your stomach. By treating the root cause. Follow these instructions at home: Oral rehydration solution If told by your doctor, drink an ORS: Make an ORS. Use instructions on the package. Start by drinking small amounts, about  cup (120 mL) every 5-10 minutes. Slowly drink more until you have had the amount that your doctor said to have. Eating and drinking     Drink enough clear fluid to keep your  pee pale yellow. If you were told to drink an ORS, finish the ORS first. Then, start slowly drinking other clear fluids. Drink fluids such as: Water. Do not drink only water. Doing that can make the salt (sodium) level in your body get too low. Water from ice chips you suck on. Fruit juice that you have added water to (diluted). Low-calorie sports drinks. Eat foods that have the right amounts of salts and minerals, such as: Bananas. Oranges. Potatoes. Tomatoes. Spinach. Do not drink alcohol. Avoid: Drinks that have a lot of sugar. These include: High-calorie sports drinks. Fruit juice that you did not add water to. Soda. Caffeine. Foods that are greasy or have a lot of fat or sugar. General instructions Take over-the-counter and prescription medicines only as told by your doctor. Do not take salt tablets. Doing that can make the salt level in your body get too high. Return to your normal activities as told by your doctor. Ask your doctor what activities are safe for you. Keep all follow-up visits as told by your doctor. This is important. Contact a doctor if: You have pain in your belly (abdomen) and the pain: Gets worse. Stays in one place. You have a  rash. You have a stiff neck. You get angry or annoyed (irritable) more easily than normal. You are more tired or have a harder time waking than normal. You feel: Weak or dizzy. Very thirsty. Get help right away if you have: Any symptoms of very bad dehydration. Symptoms of vomiting, such as: You cannot eat or drink without vomiting. Your vomiting gets worse or does not go away. Your vomit has blood or green stuff in it. Symptoms that get worse with treatment. A fever. A very bad headache. Problems with peeing or pooping (having a bowel movement), such as: Watery poop that gets worse or does not go away. Blood in your poop (stool). This may cause poop to look black and tarry. Not peeing in 6-8 hours. Peeing only a small  amount of very dark pee in 6-8 hours. Trouble breathing. These symptoms may be an emergency. Do not wait to see if the symptoms will go away. Get medical help right away. Call your local emergency services (911 in the [place] Do not drive yourself to the hospital. Summary Dehydration is a condition in which there is not enough water or other fluids in the body. This happens when a person loses more fluids than he or she takes in. Treatment for this condition depends on how bad it is. Treatment should be started right away. Do not wait until your condition gets very bad. Drink enough clear fluid to keep your pee pale yellow. If you were told to drink an oral rehydration solution (ORS), finish the ORS first. Then, start slowly drinking other clear fluids. Take over-the-counter and prescription medicines only as told by your doctor. Get help right away if you have any symptoms of very bad dehydration. This information is not intended to replace advice given to you by your health care provider. Make sure you discuss any questions you have with your health care provider. Document Revised: 12/12/2018 Document Reviewed: 12/12/2018 Elsevier Patient Education  [place].

## 2021-06-14 ENCOUNTER — Telehealth: Payer: Self-pay | Admitting: Oncology

## 2021-06-14 ENCOUNTER — Ambulatory Visit: Payer: Medicare Other | Admitting: Nurse Practitioner

## 2021-06-14 ENCOUNTER — Encounter: Payer: Self-pay | Admitting: Nurse Practitioner

## 2021-06-14 VITALS — BP 102/60 | HR 86 | Ht 71.0 in | Wt 178.4 lb

## 2021-06-14 DIAGNOSIS — K521 Toxic gastroenteritis and colitis: Secondary | ICD-10-CM | POA: Diagnosis not present

## 2021-06-14 DIAGNOSIS — E118 Type 2 diabetes mellitus with unspecified complications: Secondary | ICD-10-CM

## 2021-06-14 DIAGNOSIS — N185 Chronic kidney disease, stage 5: Secondary | ICD-10-CM | POA: Diagnosis not present

## 2021-06-14 DIAGNOSIS — I10 Essential (primary) hypertension: Secondary | ICD-10-CM

## 2021-06-14 NOTE — Progress Notes (Signed)
06/14/2021 Antonio Adams [id number] 03/06/1946   CHIEF COMPLAINT: Diarrhea follow up  HISTORY OF PRESENT ILLNESS: Antonio Adams is a 76 year old male with a past medical history of anxiety, depression, arthritis, hypertension, DM II, CKD stage IV, prostate cancer, renal cancer initially diagnosed in 2017 with recurrence with liver metastasis s/p ablation and s/p resection recurrent right kidney tumor 02/2021, autoimmune dermatitis, PUD, duodenal carcinoid s/p resection, radiation proctitis 2018 and colon polyps. S/P right adrenalectomy.   He was admitted to the hospital 05/23/2021 due to having abdominal pain and dark colored diarrhea x 3 weeks with AKI on CKD.  His Hg level was 8.7, around his baseline level. Stool was guaiac negative. His Hg level dropped to 7.5 and he was transfused 1 unit of PRBCs. CTAP showed fluid throughout the large bowel compatible with diarrhea. He was seen by our GI hospital service and it was thought his diarrhea was secondary to Nivulomab which he receives for his renal cell carcinoma. He underwent a colonoscopy in 2017 which identified two 2 - 5 mm tubular adenomatous polyps removed from the colon and radiation proctitis.   He presents to our office today for further follow up. His diarrhea abated within one week of being discharged from the hospital. He is passing a normal solid stool once daily. No rectal bleeding or black stool. He reports feeling quite well. Appetite is good and he has gained back a few pounds. No complaints at this time. His significant other is present.   CBC Latest Ref Rng & Units 06/03/2021 05/29/2021 05/29/2021  WBC 4.0 - 10.5 K/uL 5.1 5.8 6.2  Hemoglobin 13.0 - 17.0 g/dL 9.9(L) 9.5(L) 10.4(L)  Hematocrit 39.0 - 52.0 % 29.5(L) 28.2(L) 30.8(L)  Platelets 150.0 - 400.0 K/uL 218.0 209 245    CMP Latest Ref Rng & Units 05/29/2021 05/28/2021 05/27/2021  Glucose 70 - 99 mg/dL 77 100(H) 101(H)  BUN 8 - 23 mg/dL 34(H) 35(H) 39(H)   Creatinine 0.61 - 1.24 mg/dL 4.06(H) 4.29(H) 4.47(H)  Sodium 135 - 145 mmol/L 137 138 137  Potassium 3.5 - 5.1 mmol/L 4.2 3.0(L) 3.2(L)  Chloride 98 - 111 mmol/L 109 107 103  CO2 22 - 32 mmol/L 20(L) 23 22  Calcium 8.9 - 10.3 mg/dL 8.9 8.4(L) 8.4(L)  Total Protein 6.5 - 8.1 g/dL 5.0(L) - -  Total Bilirubin 0.3 - 1.2 mg/dL 0.5 - -  Alkaline Phos 38 - 126 U/L 78 - -  AST 15 - 41 U/L 47(H) - -  ALT 0 - 44 U/L 47(H) - -      CTAP wo contrast 05/23/2021: 1. Fluid throughout the large bowel compatible with diarrhea. Normal appendix, and no discrete bowel inflammation or evidence of bowel obstruction.   2. Otherwise stable noncontrast CT appearance of the abdomen and pelvis since the restaging CTs last month, including the abnormal right renal space (please see that report).   3. Aortic Atherosclerosis  PAST GI PROCEDURES:  Colonoscopy 03/14/2016 - polyp surveillance --Radiation proctitis - two 2-5 mm polyps removed --Internal hemorrhoids --Polyp path >> Tubular adenomas  Flexible sigmoidoscopy January 08, 2017 by Dr. Henrene Pastor. - Multiple bleeding colonic angioectasias. Treated with argon plasma coagulation (APC). - No specimens collected.  Past Medical History:  Diagnosis Date   ADENOCARCINOMA, PROSTATE, GLEASON GRADE 6 12/21/2009   prostate cancer   ALLERGIC RHINITIS 03/16/2007   Allergy    Anxiety    Arthritis    back    BACK PAIN WITH RADICULOPATHY  11/23/2008   Cancer of kidney ([place])    partial right kidney removed   CARPAL TUNNEL SYNDROME, BILATERAL 03/16/2007   Chronic back pain    Constipation    takes Senokot daily   Depression    occasionally   DIABETES MELLITUS, TYPE II 03/16/2007   takes Januvia and MEtformin daily   GASTROINTESTINAL HEMORRHAGE, HX OF 03/16/2007   GERD 03/16/2007   takes Omeprazole daily   Glaucoma    mild - no eye drops   Hemorrhoids    History of colon polyps    HYPERLIPIDEMIA 03/16/2007   takes Crestor daily   HYPERTENSION 03/16/2007    takes Diltiazem and Lisinopril daily    Kidney cancer, primary, with metastasis from kidney to other site [facility])    LEG CRAMPS 05/22/2007   Nocturia    Overactive bladder    PARESTHESIA 05/21/2007   Proctitis    PTSD (post-traumatic stress disorder)    wakes up may be dreaming of fighting   Rectal bleeding    Dr.Norins has explained its from the Radiation that he has received   Rectal bleeding    Renal cell carcinoma ([place])    Sleep apnea    uses CPAP nightly   Stomach cancer ([place])    Urinary frequency    takes Toviaz daily   Past Surgical History:  Procedure Laterality Date   ADRENALECTOMY Right 02/26/2020   Procedure: OPEN RIGHT RENAL CELL METASTATECTOMY AND  ADRENALECTOMY;  Surgeon: [name], MD;  Location: [facility];  Service: Urology;  Laterality: Right;   [place], 2008   repeat surgery; ESI '08   [place]     bilateral   CERVICAL FUSION     COLONOSCOPY     colonosocpy     ESOPHAGOGASTRODUODENOSCOPY     ED   FLEXIBLE SIGMOIDOSCOPY N/A 01/08/2017   Procedure: FLEXIBLE SIGMOIDOSCOPY;  Surgeon: Irene Shipper, MD;  Location: [place];  Service: Endoscopy;  Laterality: N/A;   HOT HEMOSTASIS N/A 01/08/2017   Procedure: HOT HEMOSTASIS (ARGON PLASMA COAGULATION/BICAP);  Surgeon: Irene Shipper, MD;  Location: [facility];  Service: Endoscopy;  Laterality: N/A;   IR RADIOLOGIST EVAL & MGMT  12/17/2019   IR RADIOLOGIST EVAL & MGMT  07/01/2020   IR RADIOLOGIST EVAL & MGMT  08/25/2020   LUMBAR LAMINECTOMY  10/30/2011   Procedure: MICRODISCECTOMY LUMBAR LAMINECTOMY;  Surgeon: Jessy Oto, MD;  Location: [facility];  Service: Orthopedics;  Laterality: Right;  Right L4-5 and L5-S1 Microdiscectomy   LUMBAR LAMINECTOMY  06/17/2012   Procedure: MICRODISCECTOMY LUMBAR LAMINECTOMY;  Surgeon: Jessy Oto, MD;  Location: [facility];  Service: Orthopedics;  Laterality: N/A;  Right L5-S1 microdiscectomy   OPEN PARTIAL HEPATECTOMY  N/A 02/26/2020   Procedure: OPEN PARTIAL  HEPATECTOMY, CHOLECYSTECTOMY;  Surgeon: Stark Klein, MD;  Location: [facility];  Service: General;  Laterality: N/A;   POLYPECTOMY     PROSTATE SURGERY  march 2012   seed implant   RADIOFREQUENCY ABLATION N/A 07/21/2020   Procedure: CT MICROWAVE ABLATION;  Surgeon: Criselda Peaches, MD;  Location: [facility];  Service: Anesthesiology;  Laterality: N/A;   ROBOTIC ASSITED PARTIAL NEPHRECTOMY Right 09/08/2015   Procedure: XI ROBOTIC ASSITED RIGHT PARTIAL NEPHRECTOMY;  Surgeon: [name], MD;  Location: [facility];  Service: Urology;  Laterality: Right;  Clamp on: 1033 Clamp off: 1103 Total Clamp Time: 30 minutes   SHOULDER ARTHROSCOPY WITH SUBACROMIAL DECOMPRESSION Left 01/20/2016   Procedure: LEFT SHOULDER ARTHROSCOPY WITH EXTENSIVE  DEBRIDEMENT, ACROMIOPLASTY;  Surgeon: Ninetta Lights, MD;  Location: [facility];  Service: Orthopedics;  Laterality: Left;   SHOULDER SURGERY  LFT   Stress Cardiolite  12/24/2003   negative for ischemia   UPPER ESOPHAGEAL ENDOSCOPIC ULTRASOUND (EUS)  04/18/2016   [facility]   UPPER GASTROINTESTINAL ENDOSCOPY     WOUND EXPLORATION     management of a wound that he sustained in the [place] History: He is widowed. He has two sons one daughter. He is retired. Nonsmoker. No alcohol use. No drug use.   Family History: family history includes Cancer (age of onset: 71) in his sister; Hypertension in an other family member.  Allergies  Allergen Reactions   Ace Inhibitors Swelling and Other (See Comments)    lisinopril   Opdivo [Nivolumab] Rash     Outpatient Encounter Medications as of 06/14/2021  Medication Sig   atorvastatin (LIPITOR) 20 MG tablet Take 1 tablet (20 mg total) by mouth at bedtime.   ferrous sulfate 325 (65 FE) MG tablet Take 1 tablet (325 mg total) by mouth 2 (two) times daily. Patient needs office visit for further refills   fluocinonide-emollient (LIDEX-E) 0.05 % cream APPLY TO AFFECTED AREA TWICE A DAY   gabapentin  (NEURONTIN) 100 MG capsule Take 100 mg by mouth at bedtime.   gemfibrozil (LOPID) 600 MG tablet Take 600 mg by mouth 2 (two) times daily before a meal.   Insulin Pen Needle 32G X 6 MM MISC 1 Act by Does not apply route daily.   LOKELMA 10 g PACK packet Take 10 g by mouth every Monday, Wednesday, and Friday.   Multiple Vitamin (MULTIVITAMIN WITH MINERALS) TABS tablet Take 1 tablet by mouth daily.   Omega-3 Fatty Acids (FISH OIL PO) Take 1 tablet by mouth daily.   ONE TOUCH ULTRA TEST test strip USE 2 (TWO) TIMES DAILY. AND LANCETS 2/DAY 250.03   OVER THE COUNTER MEDICATION Take 1 tablet by mouth daily. Focus factor   polyvinyl alcohol (LIQUIFILM TEARS) 1.4 % ophthalmic solution Place 1 drop into both eyes as needed for dry eyes.   solifenacin (VESICARE) 5 MG tablet TAKE 1 TABLET (5 MG TOTAL) BY MOUTH DAILY.   tamsulosin (FLOMAX) 0.4 MG CAPS capsule TAKE 1 CAPSULE (0.4 MG TOTAL) BY MOUTH DAILY. (Patient taking differently: Take 0.4 mg by mouth daily.)   metoCLOPramide (REGLAN) 10 MG tablet Take 1 tablet (10 mg total) by mouth 4 (four) times daily -  before meals and at bedtime for 5 days.   No facility-administered encounter medications on file as of 06/14/2021.    REVIEW OF SYSTEMS: All other systems reviewed and negative except where noted in the History of Present Illness.   PHYSICAL EXAM: BP 102/60    Pulse 86    Ht 5\' 11"  (1.803 m)    Wt 178 lb 6.4 oz (80.9 kg)    BMI 24.88 kg/m  General: 76 year old male in NAD. Head: Normocephalic and atraumatic. Eyes:  Sclerae non-icteric, conjunctive pink. Ears: Normal auditory acuity. Mouth: Dentition intact. No ulcers or lesions.  Neck: Supple, no lymphadenopathy or thyromegaly.  Lungs: Clear bilaterally to auscultation without wheezes, crackles or rhonchi. Heart: Regular rate and rhythm. No murmur, rub or gallop appreciated.  Abdomen: Soft, nontender, non distended. No masses. No hepatosplenomegaly. Normoactive bowel sounds x 4 quadrants.   Rectal: Deferred.  Musculoskeletal: Symmetrical with no gross deformities. Skin: Warm and dry. No rash or lesions on visible extremities. Extremities: No  edema. Neurological: Alert oriented x 4, no focal deficits.  Psychological:  Alert and cooperative. Normal mood and affect.  ASSESSMENT AND PLAN:  13) 76 year old male admitted to the hospital with N/V/D x 3 weeks and AK on CKD. C. Diff negative. GI pathogen panel negative. Diarrhea thought to be due to checkpoint inhibitor toxicity (Nivolumab). No further N/V. Diarrhea abated after taking Imodium.  -Patient to contact our office if diarrhea recurs  -Consider checking chromagranin A level if diarrhea recurs  -Diet as tolerated, push fluids  -Follow up as needed   2) Remote history of duodenal carcinoid   3) Renal cell cancer s/p partial right nephrectomy with liver mets, recurrent  tumor s/p resection 2021 Mildly elevated AST/ALT levels.  4) History of radiation proctitis   5) Anemia, secondary to renal disease and malignancy. Admission Hg 8.7 -> 7.5. Transfused one unit of PRBs during his hospital admission. Repeat Hg 9.5 on 05/29/2021. No overt GI bleeding. FOBT not recommended.  -Continue to monitor Hg level with oncologist Dr. Alen Blew  -Patient to contact our office if he develops any black stools or rectal bleeding     CC:  [name], MD

## 2021-06-14 NOTE — Patient Instructions (Signed)
It was my pleasure to provide care to you today. Based on our discussion, I am providing you with my recommendations below:  RECOMMENDATION(S):  Continue follow up with your Oncologist. Please contact our office if diarrhea recurs.   BMI:  If you are age 76 or older, your body mass index should be between 23-30. Your Body mass index is 24.88 kg/m. If this is out of the aforementioned range listed, please consider follow up with your Primary Care Provider.  If you are age 43 or younger, your body mass index should be between 19-25. Your Body mass index is 24.88 kg/m. If this is out of the aformentioned range listed, please consider follow up with your Primary Care Provider.   MY CHART:  The [place] [facility] providers would like to encourage you to use [facility] to communicate with providers for non-urgent requests or questions.  Due to long hold times on the telephone, sending your provider a message by [facility] may be a faster and more efficient way to get a response.  Please allow 48 business hours for a response.  Please remember that this is for non-urgent requests.   Thank you for trusting me with your gastrointestinal care!    Noralyn Pick, CRNP

## 2021-06-14 NOTE — Telephone Encounter (Signed)
Sch per 1/30 inbasket, left pt msg

## 2021-06-15 ENCOUNTER — Inpatient Hospital Stay: Payer: Medicare Other | Attending: Oncology | Admitting: Oncology

## 2021-06-15 ENCOUNTER — Telehealth: Payer: Self-pay | Admitting: *Deleted

## 2021-06-15 DIAGNOSIS — Z905 Acquired absence of kidney: Secondary | ICD-10-CM | POA: Insufficient documentation

## 2021-06-15 DIAGNOSIS — D630 Anemia in neoplastic disease: Secondary | ICD-10-CM | POA: Insufficient documentation

## 2021-06-15 DIAGNOSIS — C787 Secondary malignant neoplasm of liver and intrahepatic bile duct: Secondary | ICD-10-CM | POA: Insufficient documentation

## 2021-06-15 DIAGNOSIS — C7970 Secondary malignant neoplasm of unspecified adrenal gland: Secondary | ICD-10-CM | POA: Insufficient documentation

## 2021-06-15 DIAGNOSIS — Z79899 Other long term (current) drug therapy: Secondary | ICD-10-CM | POA: Insufficient documentation

## 2021-06-15 DIAGNOSIS — C641 Malignant neoplasm of right kidney, except renal pelvis: Secondary | ICD-10-CM | POA: Insufficient documentation

## 2021-06-15 NOTE — Telephone Encounter (Signed)
[name] missed his MD appt on 06/15/21. Note in his chart said the message about his appt was left on voice mail on 06/13/21 Contacted patient and Antonio Adams) and left messages to call [place] to r/s.  Schedule message sent to contact patient to r/s.

## 2021-06-15 NOTE — Progress Notes (Signed)
Assessment and plan reviewed 

## 2021-06-17 ENCOUNTER — Telehealth: Payer: Self-pay | Admitting: Oncology

## 2021-06-17 NOTE — Telephone Encounter (Signed)
Sch per 2/1 inbasket, left msg with son

## 2021-06-23 DIAGNOSIS — N2581 Secondary hyperparathyroidism of renal origin: Secondary | ICD-10-CM | POA: Diagnosis not present

## 2021-06-23 DIAGNOSIS — I129 Hypertensive chronic kidney disease with stage 1 through stage 4 chronic kidney disease, or unspecified chronic kidney disease: Secondary | ICD-10-CM | POA: Diagnosis not present

## 2021-06-23 DIAGNOSIS — E1122 Type 2 diabetes mellitus with diabetic chronic kidney disease: Secondary | ICD-10-CM | POA: Diagnosis not present

## 2021-06-23 DIAGNOSIS — C649 Malignant neoplasm of unspecified kidney, except renal pelvis: Secondary | ICD-10-CM | POA: Diagnosis not present

## 2021-06-23 DIAGNOSIS — N184 Chronic kidney disease, stage 4 (severe): Secondary | ICD-10-CM | POA: Diagnosis not present

## 2021-06-27 DIAGNOSIS — N184 Chronic kidney disease, stage 4 (severe): Secondary | ICD-10-CM | POA: Diagnosis not present

## 2021-07-05 ENCOUNTER — Inpatient Hospital Stay: Payer: Medicare Other | Admitting: Oncology

## 2021-07-05 ENCOUNTER — Other Ambulatory Visit: Payer: Self-pay

## 2021-07-05 VITALS — BP 116/65 | HR 79 | Temp 97.7°F | Resp 18 | Ht 71.0 in | Wt 177.2 lb

## 2021-07-05 DIAGNOSIS — C641 Malignant neoplasm of right kidney, except renal pelvis: Secondary | ICD-10-CM | POA: Diagnosis not present

## 2021-07-05 DIAGNOSIS — Z905 Acquired absence of kidney: Secondary | ICD-10-CM | POA: Diagnosis not present

## 2021-07-05 DIAGNOSIS — C7970 Secondary malignant neoplasm of unspecified adrenal gland: Secondary | ICD-10-CM | POA: Diagnosis not present

## 2021-07-05 DIAGNOSIS — C787 Secondary malignant neoplasm of liver and intrahepatic bile duct: Secondary | ICD-10-CM | POA: Diagnosis not present

## 2021-07-05 DIAGNOSIS — D630 Anemia in neoplastic disease: Secondary | ICD-10-CM | POA: Diagnosis not present

## 2021-07-05 DIAGNOSIS — Z79899 Other long term (current) drug therapy: Secondary | ICD-10-CM | POA: Diagnosis not present

## 2021-07-05 MED ORDER — MEGESTROL ACETATE 400 MG/10ML PO SUSP: 400.0000 mg | Freq: Every day | ORAL | 0 refills | Status: DC

## 2021-07-05 NOTE — Progress Notes (Signed)
Hematology and Oncology Follow Up Visit  Antonio Adams [id number] November 20, 1945 76 y.o. 07/05/2021 3:52 PM Antonio Adams, MDJones, Antonio Right, MD   Principle Diagnosis: 76 year old man with stage IV papillary tumor with hepatic involvement diagnosed in 2022.  Initially presented in 2017 with localized disease.  Prior Therapy:  He underwent Adams partial nephrectomy and found to have stage T1b 6 cm papillary tumor in 2017.  He completed Adams adrenalectomy and metastatic resection of a recurrent kidney tumor Adams retroperitoneum completed in October 2021 and care of Dr. Louis Adams.  The final pathology at that time confirmed the presence of papillary renal cell carcinoma.   He developed recurrent disease in March 2022 after recurrence including the adrenal metastasis as well as hepatic involvement.  He underwent percutaneous microwave ablation of the adrenal gland as well as hepatic metastasis completed in March 2022 under the care of Dr. Laurence Adams.   Nivolumab 480 mg every 4 weeks started on February 22, 2021.  He completed 2 cycles of therapy in November 2022.  Therapy interrupted due to patient's preference.  Current therapy: Active surveillance   Interim History: Antonio Adams is here for a follow-up visit.  Since last visit, he was hospitalized back in January 2023 after presenting with symptoms of nausea and vomiting as well as diarrhea.  His symptoms has resolved and he is longer having any GI complaints at this time.  He does report some weakness, fatigue as well.  He is ambulating with the help of a cane but no falls or syncope.  He has reported some weight loss on his appetite.    Medications: Updated on review. Current Outpatient Medications  Medication Sig Dispense Refill   atorvastatin (LIPITOR) 20 MG tablet Take 1 tablet (20 mg total) by mouth at bedtime. 30 tablet 0   ferrous sulfate 325 (65 FE) MG tablet Take 1 tablet (325 mg total) by mouth 2 (two) times daily. Patient  needs office visit for further refills 60 tablet 1   fluocinonide-emollient (LIDEX-E) 0.05 % cream APPLY TO AFFECTED AREA TWICE A DAY 60 g 1   gabapentin (NEURONTIN) 100 MG capsule Take 100 mg by mouth at bedtime.     gemfibrozil (LOPID) 600 MG tablet Take 600 mg by mouth 2 (two) times daily before a meal.     Insulin Pen Needle 32G X 6 MM MISC 1 Act by Does not apply route daily. 100 each 1   LOKELMA 10 g PACK packet Take 10 g by mouth every Monday, Wednesday, and Friday.     metoCLOPramide (REGLAN) 10 MG tablet Take 1 tablet (10 mg total) by mouth 4 (four) times daily -  before meals and at bedtime for 5 days. 20 tablet 0   Omega-3 Fatty Acids (FISH OIL PO) Take 1 tablet by mouth daily.     ONE TOUCH ULTRA TEST test strip USE 2 (TWO) TIMES DAILY. AND LANCETS 2/DAY 250.03 100 each 11   OVER THE COUNTER MEDICATION Take 1 tablet by mouth daily. Focus factor     polyvinyl alcohol (LIQUIFILM TEARS) 1.4 % ophthalmic solution Place 1 drop into both eyes as needed for dry eyes.     solifenacin (VESICARE) 5 MG tablet TAKE 1 TABLET (5 MG TOTAL) BY MOUTH DAILY. 30 tablet 5   tamsulosin (FLOMAX) 0.4 MG CAPS capsule TAKE 1 CAPSULE (0.4 MG TOTAL) BY MOUTH DAILY. (Patient taking differently: Take 0.4 mg by mouth daily.) 30 capsule 1   No current facility-administered medications for this visit.  Allergies:  Allergies  Allergen Reactions   Ace Inhibitors Swelling and Other (See Comments)    lisinopril   Opdivo [Nivolumab] Rash     Physical Exam: Blood pressure 116/65, pulse 79, temperature 97.7 F (36.5 C), temperature source Temporal, resp. rate 18, height 5\' 11"  (1.803 m), weight 177 lb 3.2 oz (80.4 kg), SpO2 100 %.   ECOG:    General appearance: Alert, awake without any distress. Head: Atraumatic without abnormalities Oropharynx: Without any thrush or ulcers. Eyes: No scleral icterus. Lymph nodes: No lymphadenopathy noted in the cervical, supraclavicular, or axillary  nodes Heart:regular rate and rhythm, without any murmurs or gallops.   Lung: Clear to auscultation without any rhonchi, wheezes or dullness to percussion. Abdomin: Soft, nontender without any shifting dullness or ascites. Musculoskeletal: No clubbing or cyanosis. Neurological: No motor or sensory deficits. Skin: No rashes or lesions.     Lab Results: Lab Results  Component Value Date   WBC 5.1 06/03/2021   HGB 9.9 (L) 06/03/2021   HCT 29.5 (L) 06/03/2021   MCV 90.8 06/03/2021   PLT 218.0 06/03/2021     Chemistry      Component Value Date/Time   NA 137 05/29/2021 0800   NA 138 12/02/2020 0000   K 4.2 05/29/2021 0800   CL 109 05/29/2021 0800   CO2 20 (L) 05/29/2021 0800   BUN 34 (H) 05/29/2021 0800   BUN 37 (A) 12/02/2020 0000   CREATININE 4.06 (H) 05/29/2021 0800   CREATININE 4.97 (HH) 04/20/2021 0901   CREATININE 2.11 (H) 12/18/2019 1425   GLU 152 12/02/2020 0000      Component Value Date/Time   CALCIUM 8.9 05/29/2021 0800   ALKPHOS 78 05/29/2021 0800   AST 47 (H) 05/29/2021 0800   AST 21 04/20/2021 0901   ALT 47 (H) 05/29/2021 0800   ALT 21 04/20/2021 0901   BILITOT 0.5 05/29/2021 0800   BILITOT 0.2 (L) 04/20/2021 0901         Impression and Plan:   46 year old with:  1.  Kidney cancer diagnosed in 2017.  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  Imaging studies obtained in December 2022 and in January 2023 were personally reviewed and discussed with the patient.  He has overall stable disease with decline in his hepatic involvement.  Given his overall frailty and health issues have recommended to spend any systemic therapy at this time.  I will update his staging scans in 3 months and will consider restarting systemic therapy upon progression of disease.  These options would include too little targeted therapy, immunotherapy or combination of the above.   2.  Autoimmune complications: It is unclear if he developed  autoimmune colitis but has resolved at this time.  3.  Anemia: Related to malignancy as well as renal failure.  His hemoglobin is 9.9 in January 2023.  4.  Anorexia: Prescription for Megace will be available to him.   5.  Follow-up: In 3 months for repeat follow-up.   30  minutes were spent on this visit.  Time was dedicated to review disease status update, imaging studies and answering questions regarding future plan of care.     Zola Button, MD 2/21/20233:52 PM

## 2021-07-11 ENCOUNTER — Ambulatory Visit (INDEPENDENT_AMBULATORY_CARE_PROVIDER_SITE_OTHER): Payer: Medicare Other | Admitting: *Deleted

## 2021-07-11 DIAGNOSIS — I10 Essential (primary) hypertension: Secondary | ICD-10-CM

## 2021-07-11 DIAGNOSIS — E118 Type 2 diabetes mellitus with unspecified complications: Secondary | ICD-10-CM

## 2021-07-11 NOTE — Patient Instructions (Signed)
Visit Bartholomew, thank you for taking time to talk with me today. Please don't hesitate to contact me if I can be of assistance to you before our next scheduled telephone appointment.  Below are the goals we discussed today:  Patient Self-Care Activities: Patient Antonio Adams will: Take medications as prescribed   Attend all scheduled provider appointments  Call pharmacy for medication refills  Call provider office for new concerns or questions  Continue to check blood sugars at home once you receive your new blood sugar meter: continue writing the values down in your notebook: we will review during each of our telephone calls Take the blood sugar log to all doctor visits- this will help your doctor know which medications you should be on and how your medications are working  Continue your efforts to follow a low sugar, low-carbohydrate, heart healthy, low salt diet Continue to occasionally check blood pressures at home and write them down on paper  Keep up the great work to prevent falls; continue to use your cane as needed Stay as active as you can without over-doing activity  Our next scheduled telephone follow up visit/ appointment is scheduled on:   Monday, Sep 12, 2021 at 11:30 am- This is a PHONE [place] appointment  If you need to cancel or re-schedule our visit, please call [phone] and our care guide team will be happy to assist you.   I look forward to hearing about your progress.   Oneta Rack, RN, BSN, [place] [phone]: direct office  If you are experiencing a Mental Health or [name] or need someone to talk to, please  call the Suicide and Crisis Lifeline: 988 call the [place] National Suicide Prevention Lifeline: 859-856-3279 or TTY: 610-555-4223 TTY (774)047-2308) to talk to a trained counselor call 1-800-273-TALK (toll free, 24 hour hotline) go to [facility] [address], [place] [phone]) call 911   The patient verbalized understanding of instructions, educational materials, and care plan provided today and agreed to receive a mailed copy of patient instructions, educational materials, and care plan  Living With Diabetes Diabetes (type 1 diabetes mellitus or type 2 diabetes mellitus) is a condition in which the body does not have enough of a hormone called insulin, or the body does not respond properly to insulin. Normally, insulin allows sugars (glucose) to enter cells in the body. With diabetes, extra glucose builds up in the blood instead of going into cells. This results in high blood glucose (hyperglycemia). How to manage lifestyle changes Managing diabetes includes medical treatments as well as lifestyle changes. If diabetes is not managed well, serious physical and emotional complications can occur. Taking good care of yourself means that you are responsible for: Monitoring glucose regularly. Eating a healthy diet. Exercising regularly. Meeting with health care providers. Taking medicines as directed. Most people feel some stress about managing their diabetes. When this stress becomes too much, it is known as diabetes-related distress. This is very common. Living with diabetes can place you at risk for diabetes distress, depression, or anxiety. These disorders can make diabetes more difficult to manage. How to recognize stress You may have diabetes distress if you: Avoid or ignore your daily diabetes care. This includes glucose testing, following a meal plan, and taking medications. Feel overwhelmed by your daily diabetes care. Experience emotional reactions such as anger, sadness, or fear related to your daily diabetes care. Feel fear or  shame about not doing everything perfectly that you have been told to do. Emotional distress Symptoms of diabetes distress include: Anger about having a diagnosis of  diabetes. Fear or frustration about your diagnosis and the changes you need to make to manage the condition. Being overly worried about the care that you need or the cost of the care that you need. Feeling like you caused your condition by doing something wrong. Fear about unpredictable fluctuations in your blood glucose, like low or high blood glucose. Feeling judged by your health care providers. Feeling very alone with the disease. Depression Having diabetes means that you are at a higher risk for depression. Your health care provider may test (screen) you for symptoms of depression. It is important to recognize symptoms and to start treatment for depression soon after it is diagnosed. The following are some symptoms of depression: Loss of interest in things that you used to enjoy. Feeling depressed much or most of the time. A change in appetite. Trouble getting to sleep or staying asleep. Feeling tired most of the day. Feeling nervous and anxious. Feeling guilty and worrying that you are a burden to others. Having thoughts of hurting yourself or feeling that you want to die. If you have any of these symptoms, more days than not, for 2 weeks or longer, you may have depression. This would be a good time to contact your health care provider. Follow these instructions at home: Managing diabetes distress The following are some ways to manage emotional distress: Learn as much as you can about diabetes and its treatment. Take one step at a time to improve your management. Meet with a certified diabetes care and education specialist. Take a class to learn how to manage your condition. Consider working with a counselor or therapist. Keep a journal of your thoughts and concerns. Accept that some things are out of your control. Talk with other people who have diabetes. It can help to talk about the distress that you feel. Find ways to manage stress that work for you. These may include art or  music therapy, exercise, meditation, and hobbies. Seek support from spiritual leaders, family, and friends.  General instructions Do your best to follow your diabetes management plan. If you are struggling to follow your plan, talk with a certified diabetes care and education specialist, or with someone else who has diabetes. They may have ideas that will help. Forgive yourself for not being perfect. Almost everyone struggles with the tasks of diabetes. Keep all follow-up visits. This is important. Where to find support Search for information and support from the American Diabetes Association: www.diabetes.org Find a certified diabetes education and care specialist. Make an appointment through the Association of Diabetes Care & Education Specialists: www.diabeteseducator.org Contact a health care provider if: You believe your diabetes is getting out of control. You are concerned you may be depressed. You think your medications are not helping control your diabetes. You are feeling overwhelmed with your diabetes. Get help right away if: You have thoughts about hurting yourself or others. If you ever feel like you may hurt yourself or others, or have thoughts about taking your own life, get help right away. You can go to your nearest emergency department or call: Your local emergency services (911 in the [place] A suicide crisis helpline, such as the [facility] at (424)821-6444 or 988 in the [place]. This is open 24 hours a day. Summary Diabetes (type 1 diabetes mellitus or type 2 diabetes mellitus)  is a condition in which the body does not have enough of a hormone called insulin, or the body does not respond properly to insulin. Living with diabetes puts you at risk for medical and emotional issues, such as diabetes distress, depression, and anxiety. Recognizing the symptoms of diabetes distress and depression may help you avoid problems with your diabetes control. If you  experience symptoms, it is important to discuss this with your health care provider, certified diabetes care and education specialist, or therapist. It is important to start treatment for diabetes distress and depression soon after diagnosis. Ask your health care provider to recommend a therapist who understands both depression and diabetes. This information is not intended to replace advice given to you by your health care provider. Make sure you discuss any questions you have with your health care provider. Document Revised: 11/24/2020 Document Reviewed: 09/11/2019 Elsevier Patient Education  [place].

## 2021-07-11 NOTE — Chronic Care Management (AMB) (Signed)
Chronic Care Management   CCM RN Visit Note  07/11/2021 Name: Antonio Adams MRN: [id number] DOB: 20-May-1945  Subjective: Antonio Adams is a 76 y.o. year old male who is a primary care patient of [name], MD. The care management team was consulted for assistance with disease management and care coordination needs.    Engaged with patient by telephone for follow up visit in response to provider referral for case management and/or care coordination services.   Consent to Services:  The patient was given information about Chronic Care Management services, agreed to services, and gave verbal consent prior to initiation of services.  Please see initial visit note for detailed documentation.  Patient agreed to services and verbal consent obtained.   Assessment: Review of patient past medical history, allergies, medications, health status, including review of consultants reports, laboratory and other test data, was performed as part of comprehensive evaluation and provision of chronic care management services.   SDOH (Social Determinants of Health) assessments and interventions performed:  SDOH Interventions    Flowsheet Row Most Recent Value  SDOH Interventions   Food Insecurity Interventions Intervention Not Indicated  [Continues to deny food insecurity]  Transportation Interventions Intervention Not Indicated  [Continues to drive self]      CCM Care Plan  Allergies  Allergen Reactions   Ace Inhibitors Swelling and Other (See Comments)    lisinopril   Opdivo [Nivolumab] Rash   Outpatient Encounter Medications as of 07/11/2021  Medication Sig   atorvastatin (LIPITOR) 20 MG tablet Take 1 tablet (20 mg total) by mouth at bedtime.   ferrous sulfate 325 (65 FE) MG tablet Take 1 tablet (325 mg total) by mouth 2 (two) times daily. Patient needs office visit for further refills   fluocinonide-emollient (LIDEX-E) 0.05 % cream APPLY TO AFFECTED AREA TWICE A DAY    gabapentin (NEURONTIN) 100 MG capsule Take 100 mg by mouth at bedtime.   gemfibrozil (LOPID) 600 MG tablet Take 600 mg by mouth 2 (two) times daily before a meal.   Insulin Pen Needle 32G X 6 MM MISC 1 Act by Does not apply route daily.   LOKELMA 10 g PACK packet Take 10 g by mouth every Monday, Wednesday, and Friday.   megestrol (MEGACE) 400 MG/10ML suspension Take 10 mLs (400 mg total) by mouth daily.   metoCLOPramide (REGLAN) 10 MG tablet Take 1 tablet (10 mg total) by mouth 4 (four) times daily -  before meals and at bedtime for 5 days.   Omega-3 Fatty Acids (FISH OIL PO) Take 1 tablet by mouth daily.   ONE TOUCH ULTRA TEST test strip USE 2 (TWO) TIMES DAILY. AND LANCETS 2/DAY 250.03   OVER THE COUNTER MEDICATION Take 1 tablet by mouth daily. Focus factor   polyvinyl alcohol (LIQUIFILM TEARS) 1.4 % ophthalmic solution Place 1 drop into both eyes as needed for dry eyes.   solifenacin (VESICARE) 5 MG tablet TAKE 1 TABLET (5 MG TOTAL) BY MOUTH DAILY.   tamsulosin (FLOMAX) 0.4 MG CAPS capsule TAKE 1 CAPSULE (0.4 MG TOTAL) BY MOUTH DAILY. (Patient taking differently: Take 0.4 mg by mouth daily.)   No facility-administered encounter medications on file as of 07/11/2021.   Patient Active Problem List   Diagnosis Date Noted   Absolute anemia 06/03/2021   Drug-induced colitis    CKD (chronic kidney disease) stage 5, GFR less than 15 ml/min (HCC) 05/24/2021   BPH (benign prostatic hyperplasia) 05/24/2021   Dental caries on smooth surface penetrating into  pulp 05/20/2021   Deposits (accretions) on teeth 05/20/2021   Diabetic renal disease ([place]) 05/20/2021   Dry eye syndrome of bilateral lacrimal glands 05/20/2021   Osteoarthrosis 05/20/2021   Other acquired deformity of toe 05/20/2021   Other specified disorder of rectum and anus 05/20/2021   Problem related to unspecified psychosocial circumstances 05/20/2021   Senile nuclear sclerosis 05/20/2021   Vitamin D deficiency 05/20/2021   Vitreous  degeneration, unspecified eye 05/20/2021   Intrinsic eczema 04/18/2021   Atherosclerosis of aorta ([place]) 12/30/2020   Mixed conductive and sensorineural hearing loss of left ear with restricted hearing of right ear 09/01/2020   Tinnitus of both ears 05/20/2020   Metastatic renal cell carcinoma ([place]) 02/26/2020   Diabetic polyneuropathy associated with type 2 diabetes mellitus ([place]) 12/18/2019   Murmur, cardiac 10/08/2018   DDD (degenerative disc disease), cervical 10/08/2018   OAB (overactive bladder) 04/04/2018   Seasonal allergic rhinitis due to pollen 11/01/2016   Microalbuminuria due to type 2 diabetes mellitus ([place]) 07/27/2016   Varicose veins of both lower extremities 08/06/2015   Varicose veins of lower extremities with ulcer ([place]) 06/16/2015   Internal hemorrhoid, bleeding 08/03/2014   Post traumatic stress disorder (PTSD) 01/29/2014   Chronic gingivitis, plaque induced 06/04/2012   Spinal stenosis of lumbar region with radiculopathy 10/30/2011    Class: Chronic   Obstructive sleep apnea 11/24/2010   Obesity 11/24/2010   Routine general medical examination at a health care facility 11/24/2010   ADENOCARCINOMA, PROSTATE, GLEASON GRADE 6 12/21/2009   Type 2 diabetes mellitus with diabetic cataract ([place]) 03/16/2007   Other and unspecified hyperlipidemia 03/16/2007   Essential hypertension 03/16/2007   GERD 03/16/2007   Conditions to be addressed/monitored:  HTN and DMII  Care Plan : RN Care Manager Plan of Care  Updates made by Knox Royalty, RN since 07/11/2021 12:00 AM     Problem: Chronic Disease Management Needs   Priority: Medium     Long-Range Goal: Ongoing adherence to established plan of care for long term chronic disease management   Start Date: 02/07/2021  Expected End Date: 02/07/2022  Priority: Medium  Note:   Current Barriers:  Chronic Disease Management support and education needs related to HTN and DMII Recent hospitalization January 9-15, 2023 for  dehydration due to N/V/D: discharged with home health services through [facility] [place]): Care plan goals re-established and extended 02/07/21: Patient will demonstrate ongoing health management independence for HTN, DMII  through collaboration with RN Care manager, provider, and care team.   Interventions: 1:1 collaboration with primary care provider regarding development and update of comprehensive plan of care as evidenced by provider attestation and co-signature Inter-disciplinary care team collaboration (see longitudinal plan of care) Evaluation of current treatment plan related to  self management and patient's adherence to plan as established by provider 07/11/21: Confirmed no clinical concerns: patient reports "doing great," and he denies pain/ new/ recent falls; positive reinforcement provided with encouragement to continue efforts; continues using cane as/ if indicated Reviewed recent GI, renal, and oncology provider office visits; patient verbalizes good understanding of post-office visit instructions Confirmed no medication concerns: he denies changes to medication, reports ongoing independent self-management of medications: confirms he did start taking recently prescribed Megace per post-oncology provider office visit SDOH updated; no new/ unmet needs identified  Diabetes:  (Status: 07/11/21: Goal on Track (progressing): YES.) Long Term Goal Lab Results  Component Value Date   HGBA1C 5.8 (H) 05/23/2021  Assessed patient's understanding of A1c goal: <6.5%  Provided education to patient about basic DM disease process; Discussed plans with patient for ongoing care management follow up and provided patient with direct contact information for care management team;      Reviewed scheduled/upcoming provider appointments including: oncology provider lab work 10/04/21; oncology provider office visit- 10/11/21; reports appointment with "[place] doctor next week" he is not sure of date:  he is currently not near his calendar to confirm but state she is aware/ planning to attend a scheduled;         Review of patient status, including review of consultants reports, relevant laboratory and other test results, and medications completed;       Confirmed no concerns around recent signs/ symptoms low blood sugars- he reports his glucometer is not working and verbalizes plans to obtain new glucometer to resume home monitoring at time of scheduled [facility] appointment next week; reviewed/ reinforced previously provided education around signs/ symptoms low blood sugars along with corresponding action plan for same Confirmed patient continues trying to follow DMII/ heart healthy diet "the best I can;" positive reinforcement provided with encouragement to continue efforts  Hypertension: (Status: 07/11/21: Goal on Track (progressing): YES.) Long Term Goal Last practice recorded BP readings:  BP Readings from Last 3 Encounters:  07/05/21 116/65  06/14/21 102/60  06/03/21 102/60  Most recent eGFR/CrCl: No results found for: EGFR  No components found for: CRCL  Evaluation of current treatment plan related to hypertension self management and patient's adherence to plan as established by provider;   Discussed plans with patient for ongoing care management follow up and provided patient with direct contact information for care management team; Discussed complications of poorly controlled blood pressure such as heart disease, stroke, circulatory complications, vision complications, kidney impairment, sexual dysfunction;  Confirmed patient continues monitoring/ writing down on paper blood pressures at home, weekly-- he is not at home and is unable to review specific values today; we reviewed recent blood pressures from provider appointments- all within good range  Patient Goals/Self-Care Activities: As evidenced by review of EHR, collaboration with care team, and patient reporting during CCM RN CM  outreach,  Patient Antonio Adams will: Take medications as prescribed   Attend all scheduled provider appointments  Call pharmacy for medication refills  Call provider office for new concerns or questions  Continue to check blood sugars at home once you receive your new blood sugar meter: continue writing the values down in your notebook: we will review during each of our telephone calls Take the blood sugar log to all doctor visits- this will help your doctor know which medications you should be on and how your medications are working  Continue your efforts to follow a low sugar, low-carbohydrate, heart healthy, low salt diet Continue to occasionally check blood pressures at home and write them down on paper  Keep up the great work to prevent falls; continue to use your cane as needed Stay as active as you can without over-doing activity    Plan: Telephone follow up appointment with care management team member scheduled for:  Monday, Sep 12, 2021 at 11:30 am The patient has been provided with contact information for the care management team and has been advised to call with any health related questions or concerns  Oneta Rack, RN, BSN, [place] [phone]: direct office

## 2021-07-12 DIAGNOSIS — I10 Essential (primary) hypertension: Secondary | ICD-10-CM

## 2021-07-12 DIAGNOSIS — E118 Type 2 diabetes mellitus with unspecified complications: Secondary | ICD-10-CM

## 2021-07-14 ENCOUNTER — Other Ambulatory Visit: Payer: Self-pay | Admitting: Internal Medicine

## 2021-07-14 DIAGNOSIS — L2084 Intrinsic (allergic) eczema: Secondary | ICD-10-CM

## 2021-07-14 DIAGNOSIS — T7840XA Allergy, unspecified, initial encounter: Secondary | ICD-10-CM

## 2021-07-20 ENCOUNTER — Telehealth: Payer: Self-pay | Admitting: *Deleted

## 2021-07-20 ENCOUNTER — Other Ambulatory Visit: Payer: Self-pay

## 2021-07-20 ENCOUNTER — Telehealth: Payer: Self-pay

## 2021-07-20 ENCOUNTER — Encounter (HOSPITAL_COMMUNITY): Payer: Self-pay

## 2021-07-20 ENCOUNTER — Ambulatory Visit (INDEPENDENT_AMBULATORY_CARE_PROVIDER_SITE_OTHER): Payer: Medicare Other

## 2021-07-20 ENCOUNTER — Inpatient Hospital Stay (HOSPITAL_COMMUNITY)
Admission: EM | Admit: 2021-07-20 | Discharge: 2021-07-22 | DRG: 641 | Disposition: A | Discharge status: Home / Self Care | Payer: Medicare Other | Attending: Internal Medicine | Admitting: Internal Medicine

## 2021-07-20 ENCOUNTER — Ambulatory Visit (INDEPENDENT_AMBULATORY_CARE_PROVIDER_SITE_OTHER): Payer: Medicare Other | Admitting: Emergency Medicine

## 2021-07-20 ENCOUNTER — Encounter: Payer: Self-pay | Admitting: Emergency Medicine

## 2021-07-20 ENCOUNTER — Telehealth: Payer: Self-pay | Admitting: Emergency Medicine

## 2021-07-20 ENCOUNTER — Emergency Department (HOSPITAL_COMMUNITY): Payer: Medicare Other

## 2021-07-20 VITALS — BP 140/62 | HR 78 | Temp 98.0°F | Ht 71.0 in | Wt 186.0 lb

## 2021-07-20 DIAGNOSIS — Y92009 Unspecified place in unspecified non-institutional (private) residence as the place of occurrence of the external cause: Secondary | ICD-10-CM

## 2021-07-20 DIAGNOSIS — E875 Hyperkalemia: Secondary | ICD-10-CM | POA: Diagnosis not present

## 2021-07-20 DIAGNOSIS — E872 Acidosis, unspecified: Secondary | ICD-10-CM

## 2021-07-20 DIAGNOSIS — I10 Essential (primary) hypertension: Secondary | ICD-10-CM | POA: Diagnosis present

## 2021-07-20 DIAGNOSIS — Z7984 Long term (current) use of oral hypoglycemic drugs: Secondary | ICD-10-CM | POA: Diagnosis not present

## 2021-07-20 DIAGNOSIS — Z8249 Family history of ischemic heart disease and other diseases of the circulatory system: Secondary | ICD-10-CM

## 2021-07-20 DIAGNOSIS — Z888 Allergy status to other drugs, medicaments and biological substances status: Secondary | ICD-10-CM

## 2021-07-20 DIAGNOSIS — E8721 Acute metabolic acidosis: Secondary | ICD-10-CM | POA: Diagnosis present

## 2021-07-20 DIAGNOSIS — N400 Enlarged prostate without lower urinary tract symptoms: Secondary | ICD-10-CM | POA: Diagnosis present

## 2021-07-20 DIAGNOSIS — E1122 Type 2 diabetes mellitus with diabetic chronic kidney disease: Secondary | ICD-10-CM | POA: Diagnosis not present

## 2021-07-20 DIAGNOSIS — Z20822 Contact with and (suspected) exposure to covid-19: Secondary | ICD-10-CM | POA: Diagnosis present

## 2021-07-20 DIAGNOSIS — N185 Chronic kidney disease, stage 5: Secondary | ICD-10-CM | POA: Diagnosis present

## 2021-07-20 DIAGNOSIS — R600 Localized edema: Secondary | ICD-10-CM | POA: Diagnosis not present

## 2021-07-20 DIAGNOSIS — E8722 Chronic metabolic acidosis: Secondary | ICD-10-CM | POA: Diagnosis present

## 2021-07-20 DIAGNOSIS — E878 Other disorders of electrolyte and fluid balance, not elsewhere classified: Secondary | ICD-10-CM | POA: Diagnosis not present

## 2021-07-20 DIAGNOSIS — T50996A Underdosing of other drugs, medicaments and biological substances, initial encounter: Secondary | ICD-10-CM | POA: Diagnosis present

## 2021-07-20 DIAGNOSIS — N189 Chronic kidney disease, unspecified: Secondary | ICD-10-CM

## 2021-07-20 DIAGNOSIS — Z794 Long term (current) use of insulin: Secondary | ICD-10-CM

## 2021-07-20 DIAGNOSIS — I132 Hypertensive heart and chronic kidney disease with heart failure and with stage 5 chronic kidney disease, or end stage renal disease: Secondary | ICD-10-CM | POA: Diagnosis not present

## 2021-07-20 DIAGNOSIS — Z809 Family history of malignant neoplasm, unspecified: Secondary | ICD-10-CM

## 2021-07-20 DIAGNOSIS — D649 Anemia, unspecified: Secondary | ICD-10-CM

## 2021-07-20 DIAGNOSIS — D631 Anemia in chronic kidney disease: Secondary | ICD-10-CM | POA: Diagnosis not present

## 2021-07-20 DIAGNOSIS — Z905 Acquired absence of kidney: Secondary | ICD-10-CM

## 2021-07-20 DIAGNOSIS — Z91128 Patient's intentional underdosing of medication regimen for other reason: Secondary | ICD-10-CM

## 2021-07-20 DIAGNOSIS — Z85528 Personal history of other malignant neoplasm of kidney: Secondary | ICD-10-CM | POA: Diagnosis not present

## 2021-07-20 DIAGNOSIS — E1136 Type 2 diabetes mellitus with diabetic cataract: Secondary | ICD-10-CM | POA: Diagnosis not present

## 2021-07-20 DIAGNOSIS — G4733 Obstructive sleep apnea (adult) (pediatric): Secondary | ICD-10-CM | POA: Diagnosis present

## 2021-07-20 DIAGNOSIS — G4730 Sleep apnea, unspecified: Secondary | ICD-10-CM | POA: Diagnosis not present

## 2021-07-20 DIAGNOSIS — J9811 Atelectasis: Secondary | ICD-10-CM | POA: Diagnosis not present

## 2021-07-20 DIAGNOSIS — Z9049 Acquired absence of other specified parts of digestive tract: Secondary | ICD-10-CM

## 2021-07-20 DIAGNOSIS — Z79899 Other long term (current) drug therapy: Secondary | ICD-10-CM | POA: Diagnosis not present

## 2021-07-20 DIAGNOSIS — R0609 Other forms of dyspnea: Secondary | ICD-10-CM

## 2021-07-20 DIAGNOSIS — Z6824 Body mass index (BMI) 24.0-24.9, adult: Secondary | ICD-10-CM

## 2021-07-20 DIAGNOSIS — Z8546 Personal history of malignant neoplasm of prostate: Secondary | ICD-10-CM

## 2021-07-20 DIAGNOSIS — E440 Moderate protein-calorie malnutrition: Secondary | ICD-10-CM | POA: Diagnosis present

## 2021-07-20 DIAGNOSIS — I5032 Chronic diastolic (congestive) heart failure: Secondary | ICD-10-CM | POA: Diagnosis present

## 2021-07-20 DIAGNOSIS — R000 Tachycardia, unspecified: Secondary | ICD-10-CM | POA: Diagnosis present

## 2021-07-20 DIAGNOSIS — E1121 Type 2 diabetes mellitus with diabetic nephropathy: Secondary | ICD-10-CM

## 2021-07-20 DIAGNOSIS — K219 Gastro-esophageal reflux disease without esophagitis: Secondary | ICD-10-CM | POA: Diagnosis not present

## 2021-07-20 DIAGNOSIS — I5031 Acute diastolic (congestive) heart failure: Secondary | ICD-10-CM | POA: Diagnosis not present

## 2021-07-20 DIAGNOSIS — R0600 Dyspnea, unspecified: Secondary | ICD-10-CM | POA: Diagnosis not present

## 2021-07-20 DIAGNOSIS — I2510 Atherosclerotic heart disease of native coronary artery without angina pectoris: Secondary | ICD-10-CM | POA: Diagnosis present

## 2021-07-20 DIAGNOSIS — I429 Cardiomyopathy, unspecified: Secondary | ICD-10-CM | POA: Diagnosis not present

## 2021-07-20 DIAGNOSIS — R5381 Other malaise: Secondary | ICD-10-CM | POA: Diagnosis present

## 2021-07-20 DIAGNOSIS — Z981 Arthrodesis status: Secondary | ICD-10-CM

## 2021-07-20 LAB — CBC WITH DIFFERENTIAL/PLATELET
Abs Immature Granulocytes: 0.01 10*3/uL (ref 0.00–0.07)
Basophils Absolute: 0 10*3/uL (ref 0.0–0.1)
Basophils Absolute: 0 10*3/uL (ref 0.0–0.1)
Basophils Relative: 0.6 % (ref 0.0–3.0)
Basophils Relative: 0 %
Eosinophils Absolute: 0 10*3/uL (ref 0.0–0.7)
Eosinophils Absolute: 0 10*3/uL (ref 0.0–0.5)
Eosinophils Relative: 0.8 % (ref 0.0–5.0)
Eosinophils Relative: 1 %
HCT: 24 % — ABNORMAL LOW (ref 39.0–52.0)
HCT: 21.9 % — ABNORMAL LOW (ref 39.0–52.0)
Hemoglobin: 8 g/dL — CL (ref 13.0–17.0)
Hemoglobin: 7.2 g/dL — ABNORMAL LOW (ref 13.0–17.0)
Immature Granulocytes: 0 %
Lymphocytes Relative: 13.3 % (ref 12.0–46.0)
Lymphocytes Relative: 14 %
Lymphs Abs: 0.6 10*3/uL — ABNORMAL LOW (ref 0.7–4.0)
Lymphs Abs: 0.5 10*3/uL — ABNORMAL LOW (ref 0.7–4.0)
MCH: 30.4 pg (ref 26.0–34.0)
MCHC: 33.6 g/dL (ref 30.0–36.0)
MCHC: 32.9 g/dL (ref 30.0–36.0)
MCV: 93 fl (ref 78.0–100.0)
MCV: 92.4 fL (ref 80.0–100.0)
Monocytes Absolute: 0.7 10*3/uL (ref 0.1–1.0)
Monocytes Absolute: 0.5 10*3/uL (ref 0.1–1.0)
Monocytes Relative: 17 % — ABNORMAL HIGH (ref 3.0–12.0)
Monocytes Relative: 14 %
Neutro Abs: 3 10*3/uL (ref 1.4–7.7)
Neutro Abs: 2.6 10*3/uL (ref 1.7–7.7)
Neutrophils Relative %: 68.3 % (ref 43.0–77.0)
Neutrophils Relative %: 71 %
Platelets: 219 10*3/uL (ref 150.0–400.0)
Platelets: 213 10*3/uL (ref 150–400)
RBC: 2.58 Mil/uL — ABNORMAL LOW (ref 4.22–5.81)
RBC: 2.37 MIL/uL — ABNORMAL LOW (ref 4.22–5.81)
RDW: 16.9 % — ABNORMAL HIGH (ref 11.5–15.5)
RDW: 17.4 % — ABNORMAL HIGH (ref 11.5–15.5)
WBC: 4.3 10*3/uL (ref 4.0–10.5)
WBC: 3.7 10*3/uL — ABNORMAL LOW (ref 4.0–10.5)
nRBC: 0 % (ref 0.0–0.2)

## 2021-07-20 LAB — COMPREHENSIVE METABOLIC PANEL
ALT: 23 U/L (ref 0–53)
ALT: 25 U/L (ref 0–44)
AST: 33 U/L (ref 0–37)
AST: 33 U/L (ref 15–41)
Albumin: 3.7 g/dL (ref 3.5–5.2)
Albumin: 2.8 g/dL — ABNORMAL LOW (ref 3.5–5.0)
Alkaline Phosphatase: 105 U/L (ref 39–117)
Alkaline Phosphatase: 88 U/L (ref 38–126)
Anion gap: 7 (ref 5–15)
BUN: 36 mg/dL — ABNORMAL HIGH (ref 6–23)
BUN: 36 mg/dL — ABNORMAL HIGH (ref 8–23)
CO2: 12 mEq/L — ABNORMAL LOW (ref 19–32)
CO2: 12 mmol/L — ABNORMAL LOW (ref 22–32)
Calcium: 9.6 mg/dL (ref 8.4–10.5)
Calcium: 9.3 mg/dL (ref 8.9–10.3)
Chloride: 115 mEq/L — ABNORMAL HIGH (ref 96–112)
Chloride: 117 mmol/L — ABNORMAL HIGH (ref 98–111)
Creatinine, Ser: 4.14 mg/dL — ABNORMAL HIGH (ref 0.40–1.50)
Creatinine, Ser: 4.13 mg/dL — ABNORMAL HIGH (ref 0.61–1.24)
GFR, Estimated: 14 mL/min — ABNORMAL LOW (ref >60)
GFR: 13.41 mL/min — CL (ref >60.00)
Glucose, Bld: 63 mg/dL — ABNORMAL LOW (ref 70–99)
Glucose, Bld: 114 mg/dL — ABNORMAL HIGH (ref 70–99)
Potassium: 5.7 mEq/L — ABNORMAL HIGH (ref 3.5–5.1)
Potassium: 6.4 mmol/L (ref 3.5–5.1)
Sodium: 135 mEq/L (ref 135–145)
Sodium: 136 mmol/L (ref 135–145)
Total Bilirubin: 0.3 mg/dL (ref 0.2–1.2)
Total Bilirubin: 0.1 mg/dL — ABNORMAL LOW (ref 0.3–1.2)
Total Protein: 6.6 g/dL (ref 6.0–8.3)
Total Protein: 6.1 g/dL — ABNORMAL LOW (ref 6.5–8.1)

## 2021-07-20 LAB — RESP PANEL BY RT-PCR (FLU A&B, COVID) ARPGX2
Influenza A by PCR: NEGATIVE
Influenza B by PCR: NEGATIVE
SARS Coronavirus 2 by RT PCR: NEGATIVE

## 2021-07-20 LAB — POC OCCULT BLOOD, ED: Fecal Occult Bld: NEGATIVE

## 2021-07-20 LAB — BRAIN NATRIURETIC PEPTIDE: Pro B Natriuretic peptide (BNP): 119 pg/mL — ABNORMAL HIGH (ref 0.0–100.0)

## 2021-07-20 MED ORDER — SODIUM CHLORIDE 0.9% FLUSH: 3.0000 mL | INTRAVENOUS | Status: DC | PRN

## 2021-07-20 MED ORDER — ACETAMINOPHEN 325 MG PO TABS
650.0000 mg | ORAL_TABLET | Freq: Four times a day (QID) | ORAL | Status: DC | PRN
  Administered 2021-07-21: 05:00:00 650 mg via ORAL

## 2021-07-20 MED ORDER — ONDANSETRON HCL 4 MG/2ML IJ SOLN
4.0000 mg | Freq: Four times a day (QID) | INTRAMUSCULAR | Status: DC | PRN
Start: 2021-07-20 — End: 2021-07-22

## 2021-07-20 MED ORDER — ONDANSETRON HCL 4 MG PO TABS: 4.0000 mg | ORAL_TABLET | Freq: Four times a day (QID) | ORAL | Status: DC | PRN

## 2021-07-20 MED ORDER — SENNOSIDES-DOCUSATE SODIUM 8.6-50 MG PO TABS: 1.0000 | ORAL_TABLET | Freq: Every evening | ORAL | Status: DC | PRN

## 2021-07-20 MED ORDER — DEXTROSE 50 % IV SOLN
1.0000 | Freq: Once | INTRAVENOUS | Status: AC
  Administered 2021-07-20: 50 mL via INTRAVENOUS
  Filled 2021-07-20: qty 50

## 2021-07-20 MED ORDER — FUROSEMIDE 10 MG/ML IJ SOLN
40.0000 mg | Freq: Once | INTRAMUSCULAR | Status: AC
  Administered 2021-07-20: 40 mg via INTRAVENOUS
  Filled 2021-07-20: qty 4

## 2021-07-20 MED ORDER — SODIUM CHLORIDE 0.9% FLUSH
3.0000 mL | Freq: Two times a day (BID) | INTRAVENOUS | Status: DC
  Administered 2021-07-21 (×2): 3 mL via INTRAVENOUS

## 2021-07-20 MED ORDER — INSULIN ASPART 100 UNIT/ML IV SOLN
5.0000 [IU] | Freq: Once | INTRAVENOUS | Status: AC
  Administered 2021-07-20: 5 [IU] via INTRAVENOUS
  Filled 2021-07-20: qty 0.05

## 2021-07-20 MED ORDER — SODIUM ZIRCONIUM CYCLOSILICATE 10 G PO PACK
10.0000 g | PACK | Freq: Once | ORAL | Status: AC
  Administered 2021-07-20: 10 g via ORAL
  Filled 2021-07-20: qty 1

## 2021-07-20 MED ORDER — SODIUM CHLORIDE 0.9 % IV BOLUS
500.0000 mL | Freq: Once | INTRAVENOUS | Status: AC
  Administered 2021-07-20: 500 mL via INTRAVENOUS

## 2021-07-20 MED ORDER — FUROSEMIDE 40 MG PO TABS: 40.0000 mg | ORAL_TABLET | Freq: Every day | ORAL | 3 refills | Status: DC

## 2021-07-20 MED ORDER — TAMSULOSIN HCL 0.4 MG PO CAPS
0.4000 mg | ORAL_CAPSULE | Freq: Every day | ORAL | Status: DC
  Administered 2021-07-21 – 2021-07-22 (×2): 0.4 mg via ORAL
  Filled 2021-07-20 (×2): qty 1

## 2021-07-20 MED ORDER — MEGESTROL ACETATE 400 MG/10ML PO SUSP
400.0000 mg | Freq: Every day | ORAL | Status: DC
  Administered 2021-07-21 – 2021-07-22 (×2): 400 mg via ORAL
  Filled 2021-07-20 (×2): qty 10

## 2021-07-20 MED ORDER — SODIUM CHLORIDE 0.9 % IV SOLN
250.0000 mL | INTRAVENOUS | Status: DC | PRN
  Administered 2021-07-21: 06:00:00 250 mL via INTRAVENOUS

## 2021-07-20 MED ORDER — FUROSEMIDE 40 MG PO TABS
40.0000 mg | ORAL_TABLET | Freq: Every day | ORAL | Status: DC
  Administered 2021-07-21 – 2021-07-22 (×2): 40 mg via ORAL
  Filled 2021-07-20 (×2): qty 1

## 2021-07-20 MED ORDER — ACETAMINOPHEN 650 MG RE SUPP: 650.0000 mg | Freq: Four times a day (QID) | RECTAL | Status: DC | PRN

## 2021-07-20 MED ORDER — ATORVASTATIN CALCIUM 20 MG PO TABS
20.0000 mg | ORAL_TABLET | Freq: Every day | ORAL | Status: DC
  Administered 2021-07-21: 22:00:00 20 mg via ORAL
  Filled 2021-07-20: qty 1

## 2021-07-20 NOTE — ED Provider Triage Note (Signed)
Emergency Medicine Provider Triage Evaluation Note ? ?Antonio Adams , a 76 y.o. male  was evaluated in triage.  Pt complains of abnormal lab.  Was seen by PCP today.  Noted to have hyperkalemia, renal failure, anemia.  Patient states he has history of kidney problems.  States he was told he needs dialysis however he does not want dialysis.  She does have some shortness of breath over the last 2 weeks.  No lower extremity edema.  No chest pain.  Had labs drawn at PCP which are in epic.  Denies melena or bright red per rectum ? ?Review of Systems  ?Positive: Hyperkalemia, renal failure, anemia ?Negative:  ? ?Physical Exam  ?There were no vitals taken for this visit. ?Gen:   Awake, no distress   ?Resp:  Normal effort  ?MSK:   Moves extremities without difficulty  ?Other:   ? ?Medical Decision Making  ?Medically screening exam initiated at 8:20 PM.  Appropriate orders placed.  Antonio Adams was informed that the remainder of the evaluation will be completed by another provider, this initial triage assessment does not replace that evaluation, and the importance of remaining in the ED until their evaluation is complete. ? ?Abnormal labs ?  ?Samhitha Rosen A, PA-C ?07/20/21 2021 ? ?

## 2021-07-20 NOTE — Assessment & Plan Note (Addendum)
Differential diagnosis discussed with patient. ?No history of COPD or asthma. ?There are signs and symptoms of volume overload including 9 pound weight gain over the past 10 days.  Has peripheral edema and bibasilar crackles on physical exam. ?May benefit from Lasix 40 mg daily. ?Blood work including BNP done today. ?Has history of anemia.  CBC done today. ?Doubt pulmonary embolism. ?Afebrile.  No signs of infectious process. ?No chest pain but history of diabetes.  Doubt ischemic event. ?History of hypertension.  May have hypertensive heart disease. ?History of metastatic renal cancer and chronic kidney disease stage 5 ?Volume overload secondary to end-stage renal disease also possibility.  May be reaching dialysis stage. ?Still making urine. ?Follow-up next week with PCP, Dr. Scarlette Calico. ?

## 2021-07-20 NOTE — Telephone Encounter (Signed)
CRITICAL VALUE STICKER ? ?CRITICAL VALUE: GFR 13.41 ? ?RECEIVER (on-site recipient of call):Don'Quashia Hassell Done, CMA ? ?DATE & TIME NOTIFIED: 07/20/2021 @ 1:41pm ? ?MESSENGER (representative from lab): [facility] ? ?MD NOTIFIED: yes ? ?TIME OF NOTIFICATION: 1:41pm ? ?RESPONSE:  waiting on MD response ?

## 2021-07-20 NOTE — Assessment & Plan Note (Signed)
Stable. Pt refuses to have dialysis no matter what his renal function becomes he states ?

## 2021-07-20 NOTE — Telephone Encounter (Signed)
Blood results discussed with patient. ?They show the following: ?#1 very low GFR with hyperkalemia and metabolic acidosis ?#2 severe anemia ?#3 clinically volume overloaded ?Patient advised to go to emergency department tonight for dialysis and possible admission. ?Clinically not uremic but meets criteria for dialysis with acidosis, hyperkalemia, and volume overload. ?

## 2021-07-20 NOTE — ED Triage Notes (Signed)
Pt was sent over by his doctor for abnormal labs and kidney function.  ?

## 2021-07-20 NOTE — Patient Instructions (Signed)
Heart Failure, Diagnosis °Heart failure means that your heart is not able to pump blood in the right way. This makes it hard for your body to work well. Heart failure is usually a long-term (chronic) condition. You must take good care of yourself and follow your treatment plan from your doctor. °Different stages of heart failure have different treatment plans. The stages are: °Stage A: At risk for heart failure. °Stage B: Pre-heart failure. °Stage C: Symptomatic heart failure. °Stage D: Advanced heart failure. °What are the causes? °High blood pressure. °Buildup of cholesterol and fat in the arteries. °Heart attack. This injures the heart muscle. °Heart valves that do not open and close properly. °Damage of the heart muscle. This is also called cardiomyopathy. °Infection of the heart muscle. This is also called myocarditis. °Lung disease. °What increases the risk? °Getting older. The risk of heart failure goes up as a person ages. °Being overweight. °Using tobacco or nicotine products. °Abusing alcohol or drugs. °Having taken medicines that can damage the heart. °Having any of these conditions: °Diabetes. °Abnormal heart rhythms. °Thyroid problems. °Low blood counts (anemia). °Having a family history of heart failure. °What are the signs or symptoms? °Shortness of breath. °Coughing. °Swelling of the feet, ankles, legs, or belly. °Losing or gaining weight for no reason. °Trouble breathing. °Waking from sleep because of the need to sit up and get more air. °Fast heartbeat. °Other symptoms may include: °Being very tired. °Feeling dizzy, or feeling like you may pass out (faint). °Having no desire to eat. °Feeling like you may vomit (nauseous). °Peeing (urinating) more at night. °Feeling confused. °How is this treated? °This condition may be treated with: °Medicines. These can be given to treat blood pressure and to make the heart muscles stronger. °Changes in your daily life. These may include: °Eating a healthy  diet. °Staying at a healthy body weight. °Quitting tobacco, alcohol, and drug use. °Doing exercises. °Participating in a cardiac rehabilitation program. This program helps you improve your health through exercise, education, and counseling. °Surgery. Surgery can be done to open blocked valves or to put devices in the heart, such as pacemakers. °A donor heart (heart transplant). You will receive a healthy heart from a donor. °Follow these instructions at home: °Treat other conditions as told by your doctor. These may include high blood pressure, diabetes, thyroid disease, or abnormal heart rhythms. °Learn as much as you can about heart failure. °Get support as you need it. °Keep all follow-up visits. °Where to find more information °American Heart Association: www.heart.org °Centers for Disease Control and Prevention: www.cdc.gov °National Institute on Aging: www.nia.nih.gov °Summary °Heart failure means that your heart is not able to pump blood in the right way. °This condition is often caused by high blood pressure, heart attack, or damage of the heart muscle. °Symptoms of this condition include shortness of breath and swelling of the feet, ankles, legs, or belly. You may also feel very tired or feel like you may vomit. °You may be treated with medicines, surgery, or changes in your daily life. °Treat other health conditions as told by your doctor. °This information is not intended to replace advice given to you by your health care provider. Make sure you discuss any questions you have with your health care provider. °Document Revised: 10/28/2020 Document Reviewed: 11/22/2019 °Elsevier Patient Education © 2022 [employer] ° °

## 2021-07-20 NOTE — Assessment & Plan Note (Signed)
Pt is diet controlled. Had HgbA1c of 5.8 on Jan. 8th,2023 so will not recheck at this time ?

## 2021-07-20 NOTE — Assessment & Plan Note (Addendum)
[name] is admitted to Telemetry floor.  ?Given Lokelma, lasix, insulin and D50 in the ER ?Recheck potassium in a few hours  ?Check electrolytes and renal function in am ?No EKG changes of hyperkalemia at this time ?

## 2021-07-20 NOTE — Assessment & Plan Note (Signed)
Hemoccult Stool obtained and sent to lab.  ?Monitor Hgb/Hct.  ?Transfuse PRBC if Hgb drops below 7.  ?

## 2021-07-20 NOTE — Assessment & Plan Note (Signed)
Monitor BP. Not on antihypertensive at this time ?

## 2021-07-20 NOTE — Telephone Encounter (Signed)
CRITICAL VALUE STICKER ? ?CRITICAL VALUE: Hemoglobin 8.0 ? ?RECEIVER (on-site recipient of call): Lovena Le A. Alroy Dust, CMA ? ?DATE & TIME NOTIFIED: 07/20/21 at 12:54p ? ?MESSENGER (representative from lab): Santiago Glad ? ?MD NOTIFIED: yes ? ?TIME OF NOTIFICATION: 12:55p ? ?RESPONSE:  waiting response ?

## 2021-07-20 NOTE — H&P (Addendum)
History and Physical    Patient: Antonio Adams ZLD:[id number] DOB: [birth year] DOA: 07/20/2021 DOS: the patient was seen and examined on 07/20/2021 PCP: Antonio Lima, MD  Patient coming from: Home  Chief Complaint:  Chief Complaint  Patient presents with   abnormal labs   HPI: Antonio Adams is a 76 y.o. male with medical history significant of CKD 5, Diet controlled DM, OA, BPH, hx of prostate cancer and renal cell carcinoma who presents worsening anemia, kidney function, and hyperkalemia after having labs at PCP earlier today. Labs repeat in ER and has hyperkalemia confirmed with no hemolysis. Has worsening anemia.  Has no report of abnormal bleeding or bruising and has not had black tarry stools he states.  Reports he did have to have PRBC transfusion in January for anemia.  Wife reports he has been having increase in SOB with exertion the past few weeks. No syncope. Denies Chest pain or palpitations.  Had low blood sugar and A1c in January so was taken off insulin at that time and is controlling with diet.  Review of Systems: As mentioned in the history of present illness. All other systems reviewed and are negative. Past Medical History:  Diagnosis Date   ADENOCARCINOMA, PROSTATE, GLEASON GRADE 6 12/21/2009   prostate cancer   ALLERGIC RHINITIS 03/16/2007   Allergy    Anxiety    Arthritis    back    BACK PAIN WITH RADICULOPATHY 11/23/2008   Cancer of kidney ([place])    partial right kidney removed   CARPAL TUNNEL SYNDROME, BILATERAL 03/16/2007   Chronic back pain    Constipation    takes Senokot daily   Depression    occasionally   DIABETES MELLITUS, TYPE II 03/16/2007   takes Januvia and MEtformin daily   GASTROINTESTINAL HEMORRHAGE, HX OF 03/16/2007   GERD 03/16/2007   takes Omeprazole daily   Glaucoma    mild - no eye drops   Hemorrhoids    History of colon polyps    HYPERLIPIDEMIA 03/16/2007   takes Crestor daily   HYPERTENSION 03/16/2007   takes Diltiazem and  Lisinopril daily    Kidney cancer, primary, with metastasis from kidney to other site [facility])    LEG CRAMPS 05/22/2007   Nocturia    Overactive bladder    PARESTHESIA 05/21/2007   Proctitis    PTSD (post-traumatic stress disorder)    wakes up may be dreaming of fighting   Rectal bleeding    Dr.Norins has explained its from the Radiation that he has received   Rectal bleeding    Renal cell carcinoma ([place])    Sleep apnea    uses CPAP nightly   Stomach cancer ([place])    Urinary frequency    takes Toviaz daily   Past Surgical History:  Procedure Laterality Date   ADRENALECTOMY Right 02/26/2020   Procedure: OPEN RIGHT RENAL CELL METASTATECTOMY AND  ADRENALECTOMY;  Surgeon: [name], MD;  Location: [facility];  Service: Urology;  Laterality: Right;   [place], 2008   repeat surgery; ESI '08   [place]     bilateral   CERVICAL FUSION     COLONOSCOPY     colonosocpy     ESOPHAGOGASTRODUODENOSCOPY     ED   FLEXIBLE SIGMOIDOSCOPY N/A 01/08/2017   Procedure: FLEXIBLE SIGMOIDOSCOPY;  Surgeon: Irene Shipper, MD;  Location: [facility];  Service: Endoscopy;  Laterality: N/A;   HOT HEMOSTASIS N/A 01/08/2017   Procedure: HOT HEMOSTASIS (ARGON  PLASMA COAGULATION/BICAP);  Surgeon: Irene Shipper, MD;  Location: [facility];  Service: Endoscopy;  Laterality: N/A;   IR RADIOLOGIST EVAL & MGMT  12/17/2019   IR RADIOLOGIST EVAL & MGMT  07/01/2020   IR RADIOLOGIST EVAL & MGMT  08/25/2020   LUMBAR LAMINECTOMY  10/30/2011   Procedure: MICRODISCECTOMY LUMBAR LAMINECTOMY;  Surgeon: Jessy Oto, MD;  Location: [facility];  Service: Orthopedics;  Laterality: Right;  Right L4-5 and L5-S1 Microdiscectomy   LUMBAR LAMINECTOMY  06/17/2012   Procedure: MICRODISCECTOMY LUMBAR LAMINECTOMY;  Surgeon: Jessy Oto, MD;  Location: [place];  Service: Orthopedics;  Laterality: N/A;  Right L5-S1 microdiscectomy   OPEN PARTIAL HEPATECTOMY  N/A 02/26/2020   Procedure: OPEN PARTIAL HEPATECTOMY,  CHOLECYSTECTOMY;  Surgeon: Stark Klein, MD;  Location: [facility];  Service: General;  Laterality: N/A;   POLYPECTOMY     PROSTATE SURGERY  march 2012   seed implant   RADIOFREQUENCY ABLATION N/A 07/21/2020   Procedure: CT MICROWAVE ABLATION;  Surgeon: Criselda Peaches, MD;  Location: [facility];  Service: Anesthesiology;  Laterality: N/A;   ROBOTIC ASSITED PARTIAL NEPHRECTOMY Right 09/08/2015   Procedure: XI ROBOTIC ASSITED RIGHT PARTIAL NEPHRECTOMY;  Surgeon: [name], MD;  Location: [facility];  Service: Urology;  Laterality: Right;  Clamp on: 1033 Clamp off: 1103 Total Clamp Time: 30 minutes   SHOULDER ARTHROSCOPY WITH SUBACROMIAL DECOMPRESSION Left 01/20/2016   Procedure: LEFT SHOULDER ARTHROSCOPY WITH EXTENSIVE  DEBRIDEMENT, ACROMIOPLASTY;  Surgeon: Ninetta Lights, MD;  Location: [facility];  Service: Orthopedics;  Laterality: Left;   SHOULDER SURGERY  LFT   Stress Cardiolite  12/24/2003   negative for ischemia   UPPER ESOPHAGEAL ENDOSCOPIC ULTRASOUND (EUS)  04/18/2016   [facility]   UPPER GASTROINTESTINAL ENDOSCOPY     WOUND EXPLORATION     management of a wound that he sustained in the [place] History:  reports that he has never smoked. He has never used smokeless tobacco. He reports that he does not currently use alcohol. He reports that he does not use drugs.  Allergies  Allergen Reactions   Ace Inhibitors Swelling and Other (See Comments)    lisinopril   Opdivo [Nivolumab] Rash    Family History  Problem Relation Age of Onset   Cancer Sister 35       Cancer, unsure type   Hypertension Other    Colon cancer Neg Hx    Esophageal cancer Neg Hx    Stomach cancer Neg Hx    Diabetes Neg Hx    Colon polyps Neg Hx    Rectal cancer Neg Hx     Prior to Admission medications   Medication Sig Start Date End Date Taking? Authorizing Provider  atorvastatin (LIPITOR) 20 MG tablet Take 1 tablet (20 mg total) by mouth at bedtime. 05/29/21 07/20/21  Kayleen Memos, DO  ferrous sulfate 325 (65 FE) MG tablet Take 1 tablet (325 mg total) by mouth 2 (two) times daily. Patient needs office visit for further refills 02/24/19   Irene Shipper, MD  fluocinonide-emollient (LIDEX-E) 0.05 % cream APPLY TO AFFECTED AREA TWICE A DAY 06/06/21   Antonio Lima, MD  furosemide (LASIX) 40 MG tablet Take 1 tablet (40 mg total) by mouth daily. 07/20/21   Horald Pollen, MD  gabapentin (NEURONTIN) 100 MG capsule Take 100 mg by mouth at bedtime.    [provider]  gemfibrozil (LOPID) 600 MG tablet Take 600 mg by mouth  2 (two) times daily before a meal.    [provider]  Insulin Pen Needle 32G X 6 MM MISC 1 Act by Does not apply route daily. 05/23/20   Antonio Lima, MD  levocetirizine (XYZAL) 5 MG tablet TAKE 1 TABLET BY MOUTH EVERY DAY IN THE EVENING 07/14/21   Antonio Lima, MD  LOKELMA 10 g PACK packet Take 10 g by mouth every Monday, Wednesday, and Friday. 05/04/21   [provider]  megestrol (MEGACE) 400 MG/10ML suspension Take 10 mLs (400 mg total) by mouth daily. 07/05/21   Wyatt Portela, MD  metoCLOPramide (REGLAN) 10 MG tablet Take 1 tablet (10 mg total) by mouth 4 (four) times daily -  before meals and at bedtime for 5 days. 05/29/21 07/20/21  Kayleen Memos, DO  Omega-3 Fatty Acids (FISH OIL PO) Take 1 tablet by mouth daily.    [provider]  ONE TOUCH ULTRA TEST test strip USE 2 (TWO) TIMES DAILY. AND LANCETS 2/DAY 250.03 11/05/16   Antonio Lima, MD  OVER THE COUNTER MEDICATION Take 1 tablet by mouth daily. Focus factor    [provider]  polyvinyl alcohol (LIQUIFILM TEARS) 1.4 % ophthalmic solution Place 1 drop into both eyes as needed for dry eyes.    [provider]  solifenacin (VESICARE) 5 MG tablet TAKE 1 TABLET (5 MG TOTAL) BY MOUTH DAILY. 06/02/21   Antonio Lima, MD  tamsulosin (FLOMAX) 0.4 MG CAPS capsule TAKE 1 CAPSULE (0.4 MG TOTAL) BY MOUTH DAILY. Patient taking differently: Take  0.4 mg by mouth daily. 01/20/14   Antonio Lima, MD    Physical Exam: Vitals:   07/20/21 2049 07/20/21 2100 07/20/21 2130  BP: (!) 153/82 136/75 (!) 143/81  Pulse: 81 81 75  Resp: 20 (!) 24 (!) 23  Temp: 97.6 F (36.4 C)    TempSrc: Oral    SpO2: 100% 100% 100%  General: WDWN, Alert and oriented x3.  Eyes: EOMI, PERRL, conjunctivae pale.  Sclera nonicteric HENT:  [place]/AT, external ears normal.  Nares patent without epistasis.  Mucous membranes are moist.  Neck: Soft, normal range of motion, supple, no masses, Trachea midline Respiratory: clear to auscultation bilaterally, no wheezing, no crackles. Normal respiratory effort. Cardiovascular: Regular rate and rhythm, no murmurs / rubs / gallops. No extremity edema. 2+ pedal pulses.  Abdomen: Soft, no tenderness, nondistended, no rebound or guarding.  No masses palpated. Bowel sounds normoactive. No rectal masses or bleeding. Small amount brown stool in vault Musculoskeletal: FROM. no cyanosis. Normal muscle tone.  Skin: Warm, dry, intact no rashes, no petechiae or purpura Neurologic: CN 2-12 grossly intact.  Normal speech.  Sensation intact,Strength 5/5 in all extremities.   Psychiatric: Normal mood.   Data Reviewed: Lab Work: WBC 3700 hemoglobin 7.2 hematocrit 21.9 platelets 213,000   sodium 136 potassium 6.4 chloride 117 bicarb 12 creatinine 4.13 BUN 36 glucose 114 alkaline phosphatase 88 albumin 2.8 AST 33 ALT 25 bilirubin 0.1 Hemoccult negative COVID pending  Chest x-ray shows no acute infiltrate or consolidation.  Mild basilar atelectasis noted.  EKG shows normal sinus rhythm with no acute ST elevation or depression.  QTc 392 Assessment and Plan: * Hyperkalemia Mr. Beauregard is admitted to Telemetry floor.  Given Lokelma, lasix, insulin and D50 in the ER Recheck potassium in a few hours  Check electrolytes and renal function in am No EKG changes of hyperkalemia at this time  CKD (chronic kidney disease) stage 5, GFR less than  15 ml/min (HCC) Stable. Pt refuses to have dialysis no matter what his renal function becomes he states  Anemia due to chronic kidney disease Hemoccult Stool obtained and sent to lab.  Monitor Hgb/Hct.  Transfuse PRBC if Hgb drops below 7.   Essential hypertension Monitor BP. Not on antihypertensive at this time  Type 2 diabetes mellitus with diabetic cataract ([place]) Pt is diet controlled. Had HgbA1c of 5.8 on Jan. 8th,2023 so will not recheck at this time  Advance Care Planning: Code Status:  Full Code.   SCDs for DVT prophylaxis.  Family Communication: Diagnosis and plan discussed with patient and his wife who is at bedside.  They verbalized understanding agree with plan.  Further recommendations to follow as clinically indicated  Author: Eben Burow, MD 07/20/2021 10:42 PM  For on call review [email or web address].   Addendum:  Hgb at 0130 is 6.8.  One unit of PRBC ordered for transfusion

## 2021-07-20 NOTE — ED Provider Notes (Signed)
[facility] DEPT Provider Note   CSN: [id number] Arrival date & time: 07/20/21  1946     History  Chief Complaint  Patient presents with   abnormal labs    Antonio Adams is a 76 y.o. male.  HPI This patient presents at the request of his primary care doctor due to worsening anemia, kidney function, acidosis, and hyperkalemia.  He was seen in the office earlier today.  At that time, he was endorsing exertional dyspnea.  He has extensive medical history that includes DM, chronic anemia, CKD 5, BPH, OSA, prostate CA, HTN, and GERD.  Lab work earlier today showed acute on chronic anemia with hemoglobin of 8.0.  Additionally, he has acute on chronic metabolic acidosis and a potassium of 5.7.  Currently, he endorses fatigue only.    Home Medications Prior to Admission medications   Medication Sig Start Date End Date Taking? Authorizing Provider  Apoaequorin (PREVAGEN EXTRA STRENGTH PO) Take 1 tablet by mouth daily.   Yes [provider]  atorvastatin (LIPITOR) 20 MG tablet Take 1 tablet (20 mg total) by mouth at bedtime. 05/29/21 09/17/21 Yes Kayleen Memos, DO  ferrous sulfate 325 (65 FE) MG tablet Take 1 tablet (325 mg total) by mouth 2 (two) times daily. Patient needs office visit for further refills Patient taking differently: Take 325 mg by mouth 2 (two) times daily. 02/24/19  Yes Irene Shipper, MD  furosemide (LASIX) 40 MG tablet Take 1 tablet (40 mg total) by mouth daily. 07/20/21  Yes Sagardia, Ines Bloomer, MD  gabapentin (NEURONTIN) 100 MG capsule Take 100 mg by mouth at bedtime.   Yes [provider]  gemfibrozil (LOPID) 600 MG tablet Take 600 mg by mouth 2 (two) times daily before a meal.   Yes [provider]  Iron-Vitamins (GERITOL) LIQD Take 10 mLs by mouth daily.   Yes [provider]  LOKELMA 10 g PACK packet Take 10 g by mouth every Monday, Wednesday, and Friday. 05/04/21  Yes [provider]   megestrol (MEGACE) 400 MG/10ML suspension Take 10 mLs (400 mg total) by mouth daily. 07/05/21  Yes Wyatt Portela, MD  Omega-3 Fatty Acids (FISH OIL PO) Take 1 tablet by mouth daily.   Yes [provider]  OVER THE COUNTER MEDICATION Take 1 tablet by mouth daily. Focus factor   Yes [provider]  polyvinyl alcohol (LIQUIFILM TEARS) 1.4 % ophthalmic solution Place 1 drop into both eyes daily as needed for dry eyes.   Yes [provider]  solifenacin (VESICARE) 5 MG tablet TAKE 1 TABLET (5 MG TOTAL) BY MOUTH DAILY. 06/02/21  Yes [name], MD  tamsulosin (FLOMAX) 0.4 MG CAPS capsule TAKE 1 CAPSULE (0.4 MG TOTAL) BY MOUTH DAILY. Patient taking differently: Take 0.4 mg by mouth daily. 01/20/14  Yes [name], MD  Insulin Pen Needle 32G X 6 MM MISC 1 Act by Does not apply route daily. 05/23/20   [name], MD  metoprolol tartrate (LOPRESSOR) 25 MG tablet Take 0.5 tablets (12.5 mg total) by mouth 2 (two) times daily. 07/22/21 08/21/21  Kayleen Memos, DO  multivitamin (RENA-VIT) TABS tablet Take 1 tablet by mouth at bedtime. 07/22/21 10/20/21  Irene Pap N, DO  ONE TOUCH ULTRA TEST test strip USE 2 (TWO) TIMES DAILY. AND LANCETS 2/DAY 250.03 11/05/16   [name], MD  sodium bicarbonate 650 MG tablet Take 2 tablets (1,300 mg total) by mouth 3 (three) times  daily for 5 days. 07/22/21 07/27/21  Kayleen Memos, DO      Allergies    Ace inhibitors, Xyzal [levocetirizine], and Opdivo [nivolumab]    Review of Systems   Review of Systems  Constitutional:  Positive for fatigue.  Respiratory:  Positive for shortness of breath (exertional).   Cardiovascular:  Positive for leg swelling.   Physical Exam Updated Vital Signs BP 133/83 (BP Location: Left Arm)    Pulse 80    Temp 98.7 F (37.1 C) (Oral)    Resp 20    Ht '5\' 11"'$  (1.803 m)    Wt 80.7 kg    SpO2 100%    BMI 24.81 kg/m  Physical Exam Vitals and nursing note reviewed.  Constitutional:      General: He is  not in acute distress.    Appearance: Normal appearance. He is well-developed. He is not ill-appearing, toxic-appearing or diaphoretic.  HENT:     Head: Normocephalic and atraumatic.     Right Ear: External ear normal.     Left Ear: External ear normal.     Nose: Nose normal.     Mouth/Throat:     Mouth: Mucous membranes are moist.     Pharynx: Oropharynx is clear.  Eyes:     Extraocular Movements: Extraocular movements intact.     Conjunctiva/sclera: Conjunctivae normal.  Cardiovascular:     Rate and Rhythm: Normal rate and regular rhythm.     Heart sounds: No murmur heard. Pulmonary:     Effort: Pulmonary effort is normal. No respiratory distress.     Breath sounds: Normal breath sounds. No wheezing or rales.  Chest:     Chest wall: No tenderness.  Abdominal:     Palpations: Abdomen is soft.     Tenderness: There is no abdominal tenderness.  Musculoskeletal:        General: No swelling. Normal range of motion.     Cervical back: Normal range of motion and neck supple.     Right lower leg: Edema present.     Left lower leg: Edema present.  Skin:    General: Skin is warm and dry.     Capillary Refill: Capillary refill takes less than 2 seconds.     Coloration: Skin is not jaundiced or pale.  Neurological:     General: No focal deficit present.     Mental Status: He is alert and oriented to person, place, and time.     Cranial Nerves: No cranial nerve deficit.     Sensory: No sensory deficit.     Motor: No weakness.     Coordination: Coordination normal.  Psychiatric:        Mood and Affect: Mood normal.        Behavior: Behavior normal.        Thought Content: Thought content normal.        Judgment: Judgment normal.    ED Results / Procedures / Treatments   Labs (all labs ordered are listed, but only abnormal results are displayed) Labs Reviewed  CBC WITH DIFFERENTIAL/PLATELET - Abnormal; Notable for the following components:      Result Value   WBC 3.7 (*)     RBC 2.37 (*)    Hemoglobin 7.2 (*)    HCT 21.9 (*)    RDW 17.4 (*)    Lymphs Abs 0.5 (*)    All other components within normal limits  COMPREHENSIVE METABOLIC PANEL - Abnormal; Notable for the following components:  Potassium 6.4 (*)    Chloride 117 (*)    CO2 12 (*)    Glucose, Bld 114 (*)    BUN 36 (*)    Creatinine, Ser 4.13 (*)    Total Protein 6.1 (*)    Albumin 2.8 (*)    Total Bilirubin 0.1 (*)    GFR, Estimated 14 (*)    All other components within normal limits  BASIC METABOLIC PANEL - Abnormal; Notable for the following components:   Sodium 134 (*)    Potassium 6.1 (*)    Chloride 118 (*)    CO2 10 (*)    Glucose, Bld 67 (*)    BUN 40 (*)    Creatinine, Ser 4.03 (*)    GFR, Estimated 15 (*)    All other components within normal limits  CBC - Abnormal; Notable for the following components:   RBC 2.56 (*)    Hemoglobin 7.7 (*)    HCT 23.8 (*)    RDW 17.4 (*)    All other components within normal limits  POTASSIUM - Abnormal; Notable for the following components:   Potassium 5.8 (*)    All other components within normal limits  HEMOGLOBIN AND HEMATOCRIT, BLOOD - Abnormal; Notable for the following components:   Hemoglobin 6.8 (*)    HCT 20.6 (*)    All other components within normal limits  HEMOGLOBIN AND HEMATOCRIT, BLOOD - Abnormal; Notable for the following components:   Hemoglobin 9.0 (*)    HCT 27.9 (*)    All other components within normal limits  BLOOD GAS, VENOUS - Abnormal; Notable for the following components:   pH, Ven 7.23 (*)    pCO2, Ven 26 (*)    Bicarbonate 11.1 (*)    Acid-base deficit 15.0 (*)    All other components within normal limits  CBC - Abnormal; Notable for the following components:   RBC 2.93 (*)    Hemoglobin 8.9 (*)    HCT 27.5 (*)    RDW 17.0 (*)    All other components within normal limits  CBC - Abnormal; Notable for the following components:   RBC 3.29 (*)    Hemoglobin 10.2 (*)    HCT 30.0 (*)    RDW 16.6 (*)     All other components within normal limits  COMPREHENSIVE METABOLIC PANEL - Abnormal; Notable for the following components:   Potassium 5.5 (*)    Chloride 116 (*)    CO2 14 (*)    BUN 40 (*)    Creatinine, Ser 3.85 (*)    Total Protein 6.2 (*)    Albumin 3.0 (*)    GFR, Estimated 16 (*)    All other components within normal limits  PHOSPHORUS - Abnormal; Notable for the following components:   Phosphorus 4.7 (*)    All other components within normal limits  LIPID PANEL - Abnormal; Notable for the following components:   HDL 34 (*)    All other components within normal limits  CBG MONITORING, ED - Abnormal; Notable for the following components:   Glucose-Capillary 112 (*)    All other components within normal limits  RESP PANEL BY RT-PCR (FLU A&B, COVID) ARPGX2  MAGNESIUM  POC OCCULT BLOOD, ED  TYPE AND SCREEN  PREPARE RBC (CROSSMATCH)  PREPARE RBC (CROSSMATCH)    EKG EKG Interpretation  Date/Time:  Wednesday July 20 2021 20:53:06 EST Ventricular Rate:  81 PR Interval:  186 QRS Duration: 75 QT Interval:  337 QTC Calculation:  392 R Axis:   40 Text Interpretation: Sinus rhythm Abnormal R-wave progression, early transition Confirmed by Lavenia Atlas [phone]) on 07/22/2021 9:51:41 AM  Radiology DG Chest 2 View  Result Date: 07/20/2021 CLINICAL DATA:  Shortness of breath.  Abnormal kidney function. EXAM: CHEST - 2 VIEW COMPARISON:  Radiograph earlier today. FINDINGS: Lower lung volumes from exam earlier today. Stable heart size and mediastinal contours allowing for differences in technique. Minor basilar atelectasis without confluent consolidation. No pneumothorax, pleural effusion, or pulmonary edema. No acute osseous findings. IMPRESSION: Lower lung volumes from earlier today with mild basilar atelectasis. Electronically Signed   By: Keith Rake M.D.   On: 07/20/2021 20:39   ECHOCARDIOGRAM COMPLETE  Result Date: 07/21/2021    ECHOCARDIOGRAM REPORT   Patient Name:    Antonio Adams Date of Exam: 07/21/2021 Medical Rec #:  [id number]           Height:       71.0 in Accession #:    [id number]          Weight:       186.1 lb Date of Birth:  [birth year]          BSA:          2.045 m Patient Age:    33 years            BP:           148/86 mmHg Patient Gender: M                   HR:           72 bpm. Exam Location:  Inpatient Procedure: 2D Echo, 3D Echo, Cardiac Doppler and Color Doppler Indications:    CHF-Acute Diastolic Q46.96  History:        Patient has no prior history of Echocardiogram examinations.                 Risk Factors:Sleep Apnea, Hypertension, Dyslipidemia and                 Diabetes.  Sonographer:    Bernadene Person RDCS Referring Phys: [id number] [place]  1. Left ventricular ejection fraction, by estimation, is 50%. Left ventricular ejection fraction by 3D volume is 48 %. The left ventricle has mildly decreased function. The left ventricle demonstrates global hypokinesis. Left ventricular diastolic parameters are consistent with Grade I diastolic dysfunction (impaired relaxation).  2. Right ventricular systolic function is normal. The right ventricular size is normal.  3. The mitral valve is normal in structure. No evidence of mitral valve regurgitation. No evidence of mitral stenosis.  4. The aortic valve is tricuspid. Aortic valve regurgitation is not visualized. Aortic valve sclerosis is present, with no evidence of aortic valve stenosis.  5. The inferior vena cava is normal in size with greater than 50% respiratory variability, suggesting right atrial pressure of 3 mmHg. Comparison(s): No prior Echocardiogram. FINDINGS  Left Ventricle: Left ventricular ejection fraction, by estimation, is 50%. Left ventricular ejection fraction by 3D volume is 48 %. The left ventricle has mildly decreased function. The left ventricle demonstrates global hypokinesis. The left ventricular internal cavity size was normal in size. There is no left ventricular  hypertrophy. Left ventricular diastolic parameters are consistent with Grade I diastolic dysfunction (impaired relaxation). Right Ventricle: The right ventricular size is normal. No increase in right ventricular wall thickness. Right ventricular systolic function is normal. Left Atrium: Left atrial size was normal in size.  Right Atrium: Right atrial size was normal in size. Pericardium: There is no evidence of pericardial effusion. Mitral Valve: The mitral valve is normal in structure. No evidence of mitral valve regurgitation. No evidence of mitral valve stenosis. Tricuspid Valve: The tricuspid valve is normal in structure. Tricuspid valve regurgitation is not demonstrated. Aortic Valve: The aortic valve is tricuspid. Aortic valve regurgitation is not visualized. Aortic valve sclerosis is present, with no evidence of aortic valve stenosis. Pulmonic Valve: The pulmonic valve was not well visualized. Pulmonic valve regurgitation is not visualized. Aorta: The aortic root and ascending aorta are structurally normal, with no evidence of dilitation. Venous: The inferior vena cava is normal in size with greater than 50% respiratory variability, suggesting right atrial pressure of 3 mmHg. IAS/Shunts: No atrial level shunt detected by color flow Doppler.  LEFT VENTRICLE PLAX 2D LVIDd:         4.80 cm         Diastology LVIDs:         3.50 cm         LV e' medial:    4.95 cm/s LV PW:         1.00 cm         LV E/e' medial:  10.4 LV IVS:        1.20 cm         LV e' lateral:   6.38 cm/s LVOT diam:     2.20 cm         LV E/e' lateral: 8.1 LV SV:         73 LV SV Index:   36 LVOT Area:     3.80 cm        3D Volume EF                                LV 3D EF:    Left                                             ventricul LV Volumes (MOD)                            ar LV vol d, MOD    127.0 ml                   ejection A2C:                                        fraction LV vol d, MOD    129.0 ml                   by 3D A4C:                                         volume is LV vol s, MOD    64.2 ml                    48 %. A2C: LV vol s, MOD    61.9 ml A4C:  3D Volume EF: LV SV MOD A2C:   62.8 ml       3D EF:        48 % LV SV MOD A4C:   129.0 ml      LV EDV:       171 ml LV SV MOD BP:    65.9 ml       LV ESV:       89 ml                                LV SV:        82 ml RIGHT VENTRICLE RV S prime:     10.60 cm/s TAPSE (M-mode): 1.8 cm LEFT ATRIUM           Index        RIGHT ATRIUM           Index LA diam:      3.90 cm 1.91 cm/m   RA Area:     17.20 cm LA Vol (A2C): 35.4 ml 17.31 ml/m  RA Volume:   41.30 ml  20.20 ml/m LA Vol (A4C): 36.0 ml 17.61 ml/m  AORTIC VALVE LVOT Vmax:   95.10 cm/s LVOT Vmean:  61.300 cm/s LVOT VTI:    0.191 m  AORTA Ao Root diam: 3.30 cm Ao Asc diam:  3.20 cm MITRAL VALVE MV Area (PHT): 2.73 cm    SHUNTS MV Decel Time: 278 msec    Systemic VTI:  0.19 m MV E velocity: 51.40 cm/s  Systemic Diam: 2.20 cm MV A velocity: 50.70 cm/s MV E/A ratio:  1.01 Rudean Haskell MD Electronically signed by Rudean Haskell MD Signature Date/Time: 07/21/2021/1:59:19 PM    Final     Procedures Procedures    Medications Ordered in ED Medications  megestrol (MEGACE) 400 MG/10ML suspension 400 mg (400 mg Oral Given 07/22/21 1059)  atorvastatin (LIPITOR) tablet 20 mg (20 mg Oral Given 07/21/21 2138)  furosemide (LASIX) tablet 40 mg (40 mg Oral Given 07/22/21 1101)  tamsulosin (FLOMAX) capsule 0.4 mg (0.4 mg Oral Given 07/22/21 1059)  sodium chloride flush (NS) 0.9 % injection 3 mL (3 mLs Intravenous Not Given 07/22/21 1105)  sodium chloride flush (NS) 0.9 % injection 3 mL (has no administration in time range)  0.9 %  sodium chloride infusion (0 mLs Intravenous Stopped 07/21/21 1018)  acetaminophen (TYLENOL) tablet 650 mg (650 mg Oral Given 07/21/21 0514)    Or  acetaminophen (TYLENOL) suppository 650 mg ( Rectal See Alternative 07/21/21 0514)  ondansetron (ZOFRAN) tablet 4 mg (has no  administration in time range)    Or  ondansetron (ZOFRAN) injection 4 mg (has no administration in time range)  senna-docusate (Senokot-S) tablet 1 tablet (has no administration in time range)  0.9 %  sodium chloride infusion (Manually program via Guardrails IV Fluids) ( Intravenous Not Given 07/21/21 0653)  acetaminophen (TYLENOL) tablet 650 mg (650 mg Oral Not Given 07/21/21 0539)  0.9 %  sodium chloride infusion (Manually program via Guardrails IV Fluids) ( Intravenous Not Given 07/21/21 1024)  sodium bicarbonate 150 mEq in sterile water 1,150 mL infusion ( Intravenous Infusion Verify 07/22/21 0612)  sodium bicarbonate tablet 1,300 mg (1,300 mg Oral Given 07/22/21 1101)  sodium zirconium cyclosilicate (LOKELMA) packet 10 g (10 g Oral Given 07/22/21 1111)  heparin injection 5,000 Units (has no administration in time range)  metoprolol tartrate (LOPRESSOR) tablet 12.5 mg (12.5 mg Oral  Given 07/22/21 1059)  feeding supplement (NEPRO CARB STEADY) liquid 237 mL (has no administration in time range)  (feeding supplement) PROSource Plus liquid 30 mL (has no administration in time range)  multivitamin (RENA-VIT) tablet 1 tablet (has no administration in time range)  furosemide (LASIX) injection 40 mg (40 mg Intravenous Given 07/20/21 2204)  sodium chloride 0.9 % bolus 500 mL (0 mLs Intravenous Stopped 07/21/21 0156)  sodium zirconium cyclosilicate (LOKELMA) packet 10 g (10 g Oral Given 07/20/21 2207)  insulin aspart (novoLOG) injection 5 Units (5 Units Intravenous Given 07/20/21 2205)    And  dextrose 50 % solution 50 mL (50 mLs Intravenous Given 07/20/21 2205)  diphenhydrAMINE (BENADRYL) injection 25 mg (25 mg Intravenous Given 07/21/21 0515)  sodium zirconium cyclosilicate (LOKELMA) packet 10 g (10 g Oral Given 07/21/21 2137)    ED Course/ Medical Decision Making/ A&P                           Medical Decision Making Amount and/or Complexity of Data Reviewed Labs: ordered.  Risk OTC drugs. Prescription drug  management. Decision regarding hospitalization.   This patient presents to the ED for concern of lab abnormalities, this involves an extensive number of treatment options, and is a complaint that carries with it a high risk of complications and morbidity.  The differential diagnosis includes Worsening CKD, occult bleeding, medication side effects   Co morbidities that complicate the patient evaluation  DM, chronic anemia, CKD 5, BPH, OSA, prostate CA, HTN, and GERD   Additional history obtained:  Additional history obtained from patient's partner External records from outside source obtained and reviewed including EMR   Lab Tests:  I Ordered, and personally interpreted labs.  The pertinent results include:  Baseline creatinine and azotemia, acute NAGMA, hyperkalemia, acute on chronic anemia   Imaging Studies ordered:  I ordered imaging studies including CXR  I independently visualized and interpreted imaging which showed no acute findings I agree with the radiologist interpretation   Cardiac Monitoring:  The patient was maintained on a cardiac monitor.  I personally viewed and interpreted the cardiac monitored which showed an underlying rhythm of: sinus rhythm   Medicines ordered and prescription drug management:  I ordered medication including lasix/IVF, insulin/dextrose and lokelma for hyperkalemia  Reevaluation of the patient after these medicines showed that the patient stayed the same I have reviewed the patients home medicines and have made adjustments as needed  Critical Interventions:  lasix/IVF, insulin/dextrose and lokelma for hyperkalemia    Problem List / ED Course:  Patient is a 76 year old male presenting at the request of his primary care doctor due to abnormal lab findings earlier today.  Patient CKD father baseline.  Today he was found to have non-anion gap metabolic acidosis and hyperkalemia.  He was also found to have a recent decrease in his  hemoglobin.  He is unaware of any recent bleeding.  At baseline, patient has hemoglobin of 10.  This morning was 8 and repeat blood work on arrival in the ED tonight was 7.2.  He has had recent increased exertional shortness of breath.  This is likely multifactorial with the acute anemia being 1 of those factors.  On repeat lab work tonight, patient also has worsened hyperkalemia which is now elevated at 6.4.  Temporizing medications were given.  Patient made it very clear that he would never want to be on dialysis, even temporarily.  He was admitted to the hospital for  further management.    Social Determinants of Health:  Has access to outpatient care  CRITICAL CARE Performed by: Godfrey Pick   Total critical care time: 32 minutes  Critical care time was exclusive of separately billable procedures and treating other patients.  Critical care was necessary to treat or prevent imminent or life-threatening deterioration.  Critical care was time spent personally by me on the following activities: development of treatment plan with patient and/or surrogate as well as nursing, discussions with consultants, evaluation of patient's response to treatment, examination of patient, obtaining history from patient or surrogate, ordering and performing treatments and interventions, ordering and review of laboratory studies, ordering and review of radiographic studies, pulse oximetry and re-evaluation of patient's condition.          Final Clinical Impression(s) / ED Diagnoses Final diagnoses:  Hyperkalemia  Hyperchloremia  Metabolic acidosis  Low hemoglobin    Rx / DC Orders ED Discharge Orders          Ordered    multivitamin (RENA-VIT) TABS tablet  Daily at bedtime        07/22/21 1207    sodium bicarbonate 650 MG tablet  3 times daily        07/22/21 1207    metoprolol tartrate (LOPRESSOR) 25 MG tablet  2 times daily        07/22/21 1207              Godfrey Pick, MD 07/22/21  1630

## 2021-07-20 NOTE — Progress Notes (Signed)
Antonio Adams 76 y.o.   Chief Complaint  Patient presents with   Shortness of Breath    When moving around,    HISTORY OF PRESENT ILLNESS: Acute problem visit today.  Patient of Dr. Scarlette Calico. This is a 76 y.o. male accompanied by significant other complaining of difficulty breathing mostly when walking or moving around.  Denies chest pain or lightheadedness. Does not get nauseous or throws up.  No syncopal episodes. Has multiple chronic medical problems.  Problem list reviewed with patient. Medication list reviewed with patient.  Shortness of Breath Associated symptoms include leg swelling. Pertinent negatives include no abdominal pain, chest pain, fever, headaches, hemoptysis, rash, sore throat or vomiting.    Prior to Admission medications   Medication Sig Start Date End Date Taking? Authorizing Provider  atorvastatin (LIPITOR) 20 MG tablet Take 1 tablet (20 mg total) by mouth at bedtime. 05/29/21 07/20/21 Yes Kayleen Memos, DO  ferrous sulfate 325 (65 FE) MG tablet Take 1 tablet (325 mg total) by mouth 2 (two) times daily. Patient needs office visit for further refills 02/24/19  Yes Irene Shipper, MD  fluocinonide-emollient (LIDEX-E) 0.05 % cream APPLY TO AFFECTED AREA TWICE A DAY 06/06/21  Yes [name], MD  furosemide (LASIX) 40 MG tablet Take 1 tablet (40 mg total) by mouth daily. 07/20/21  Yes Shekina Cordell, Ines Bloomer, MD  gabapentin (NEURONTIN) 100 MG capsule Take 100 mg by mouth at bedtime.   Yes [provider]  gemfibrozil (LOPID) 600 MG tablet Take 600 mg by mouth 2 (two) times daily before a meal.   Yes [provider]  Insulin Pen Needle 32G X 6 MM MISC 1 Act by Does not apply route daily. 05/23/20  Yes [name], MD  levocetirizine (XYZAL) 5 MG tablet TAKE 1 TABLET BY MOUTH EVERY DAY IN THE EVENING 07/14/21  Yes [name], MD  LOKELMA 10 g PACK packet Take 10 g by mouth every Monday, Wednesday, and Friday. 05/04/21  Yes [provider]  megestrol (MEGACE) 400 MG/10ML suspension Take 10 mLs (400 mg total) by mouth daily. 07/05/21  Yes Wyatt Portela, MD  metoCLOPramide (REGLAN) 10 MG tablet Take 1 tablet (10 mg total) by mouth 4 (four) times daily -  before meals and at bedtime for 5 days. 05/29/21 07/20/21 Yes Hall, Lorenda Cahill, DO  Omega-3 Fatty Acids (FISH OIL PO) Take 1 tablet by mouth daily.   Yes [provider]  ONE TOUCH ULTRA TEST test strip USE 2 (TWO) TIMES DAILY. AND LANCETS 2/DAY 250.03 11/05/16  Yes [name], MD  OVER THE COUNTER MEDICATION Take 1 tablet by mouth daily. Focus factor   Yes [provider]  polyvinyl alcohol (LIQUIFILM TEARS) 1.4 % ophthalmic solution Place 1 drop into both eyes as needed for dry eyes.   Yes [provider]  solifenacin (VESICARE) 5 MG tablet TAKE 1 TABLET (5 MG TOTAL) BY MOUTH DAILY. 06/02/21  Yes [name], MD  tamsulosin (FLOMAX) 0.4 MG CAPS capsule TAKE 1 CAPSULE (0.4 MG TOTAL) BY MOUTH DAILY. Patient taking differently: Take 0.4 mg by mouth daily. 01/20/14  Yes [name], MD    Allergies  Allergen Reactions   Ace Inhibitors Swelling and Other (See Comments)    lisinopril   Opdivo [Nivolumab] Rash    Patient Active Problem List   Diagnosis Date Noted   Absolute anemia 06/03/2021   Drug-induced colitis    CKD (chronic kidney disease)  stage 5, GFR less than 15 ml/min (HCC) 05/24/2021   BPH (benign prostatic hyperplasia) 05/24/2021   Dental caries on smooth surface penetrating into pulp 05/20/2021   Deposits (accretions) on teeth 05/20/2021   Diabetic renal disease ([place]) 05/20/2021   Dry eye syndrome of bilateral lacrimal glands 05/20/2021   Osteoarthrosis 05/20/2021   Other acquired deformity of toe 05/20/2021   Other specified disorder of rectum and anus 05/20/2021   Problem related to unspecified psychosocial circumstances 05/20/2021   Senile nuclear sclerosis 05/20/2021   Vitamin D deficiency 05/20/2021   Vitreous  degeneration, unspecified eye 05/20/2021   Intrinsic eczema 04/18/2021   Atherosclerosis of aorta ([place]) 12/30/2020   Mixed conductive and sensorineural hearing loss of left ear with restricted hearing of right ear 09/01/2020   Tinnitus of both ears 05/20/2020   Metastatic renal cell carcinoma ([place]) 02/26/2020   Diabetic polyneuropathy associated with type 2 diabetes mellitus ([place]) 12/18/2019   Murmur, cardiac 10/08/2018   DDD (degenerative disc disease), cervical 10/08/2018   OAB (overactive bladder) 04/04/2018   Seasonal allergic rhinitis due to pollen 11/01/2016   Microalbuminuria due to type 2 diabetes mellitus ([place]) 07/27/2016   Varicose veins of both lower extremities 08/06/2015   Varicose veins of lower extremities with ulcer ([place]) 06/16/2015   Internal hemorrhoid, bleeding 08/03/2014   Post traumatic stress disorder (PTSD) 01/29/2014   Chronic gingivitis, plaque induced 06/04/2012   Spinal stenosis of lumbar region with radiculopathy 10/30/2011    Class: Chronic   Obstructive sleep apnea 11/24/2010   Obesity 11/24/2010   Routine general medical examination at a health care facility 11/24/2010   ADENOCARCINOMA, PROSTATE, GLEASON GRADE 6 12/21/2009   Type 2 diabetes mellitus with diabetic cataract ([place]) 03/16/2007   Other and unspecified hyperlipidemia 03/16/2007   Essential hypertension 03/16/2007   GERD 03/16/2007    Past Medical History:  Diagnosis Date   ADENOCARCINOMA, PROSTATE, GLEASON GRADE 6 12/21/2009   prostate cancer   ALLERGIC RHINITIS 03/16/2007   Allergy    Anxiety    Arthritis    back    BACK PAIN WITH RADICULOPATHY 11/23/2008   Cancer of kidney ([place])    partial right kidney removed   CARPAL TUNNEL SYNDROME, BILATERAL 03/16/2007   Chronic back pain    Constipation    takes Senokot daily   Depression    occasionally   DIABETES MELLITUS, TYPE II 03/16/2007   takes Januvia and MEtformin daily   GASTROINTESTINAL HEMORRHAGE, HX OF 03/16/2007   GERD 03/16/2007    takes Omeprazole daily   Glaucoma    mild - no eye drops   Hemorrhoids    History of colon polyps    HYPERLIPIDEMIA 03/16/2007   takes Crestor daily   HYPERTENSION 03/16/2007   takes Diltiazem and Lisinopril daily    Kidney cancer, primary, with metastasis from kidney to other site [facility])    LEG CRAMPS 05/22/2007   Nocturia    Overactive bladder    PARESTHESIA 05/21/2007   Proctitis    PTSD (post-traumatic stress disorder)    wakes up may be dreaming of fighting   Rectal bleeding    Dr.Norins has explained its from the Radiation that he has received   Rectal bleeding    Renal cell carcinoma ([place])    Sleep apnea    uses CPAP nightly   Stomach cancer ([place])    Urinary frequency    takes Toviaz daily    Past Surgical History:  Procedure Laterality Date   ADRENALECTOMY Right 02/26/2020  Procedure: OPEN RIGHT RENAL CELL METASTATECTOMY AND  ADRENALECTOMY;  Surgeon: [name], MD;  Location: [facility];  Service: Urology;  Laterality: Right;   [place], 2008   repeat surgery; ESI '08   [place]     bilateral   CERVICAL FUSION     COLONOSCOPY     colonosocpy     ESOPHAGOGASTRODUODENOSCOPY     ED   FLEXIBLE SIGMOIDOSCOPY N/A 01/08/2017   Procedure: FLEXIBLE SIGMOIDOSCOPY;  Surgeon: Irene Shipper, MD;  Location: [place];  Service: Endoscopy;  Laterality: N/A;   HOT HEMOSTASIS N/A 01/08/2017   Procedure: HOT HEMOSTASIS (ARGON PLASMA COAGULATION/BICAP);  Surgeon: Irene Shipper, MD;  Location: [facility];  Service: Endoscopy;  Laterality: N/A;   IR RADIOLOGIST EVAL & MGMT  12/17/2019   IR RADIOLOGIST EVAL & MGMT  07/01/2020   IR RADIOLOGIST EVAL & MGMT  08/25/2020   LUMBAR LAMINECTOMY  10/30/2011   Procedure: MICRODISCECTOMY LUMBAR LAMINECTOMY;  Surgeon: Jessy Oto, MD;  Location: [place];  Service: Orthopedics;  Laterality: Right;  Right L4-5 and L5-S1 Microdiscectomy   LUMBAR LAMINECTOMY  06/17/2012   Procedure: MICRODISCECTOMY LUMBAR LAMINECTOMY;   Surgeon: Jessy Oto, MD;  Location: [place];  Service: Orthopedics;  Laterality: N/A;  Right L5-S1 microdiscectomy   OPEN PARTIAL HEPATECTOMY  N/A 02/26/2020   Procedure: OPEN PARTIAL HEPATECTOMY, CHOLECYSTECTOMY;  Surgeon: Stark Klein, MD;  [facility];  Service: General;  Laterality: N/A;   POLYPECTOMY     PROSTATE SURGERY  march 2012   seed implant   RADIOFREQUENCY ABLATION N/A 07/21/2020   Procedure: CT MICROWAVE ABLATION;  Surgeon: Criselda Peaches, MD;  [facility];  Service: Anesthesiology;  Laterality: N/A;   ROBOTIC ASSITED PARTIAL NEPHRECTOMY Right 09/08/2015   Procedure: XI ROBOTIC ASSITED RIGHT PARTIAL NEPHRECTOMY;  Surgeon: [name], MD;  [facility];  Service: Urology;  Laterality: Right;  Clamp on: 1033 Clamp off: 1103 Total Clamp Time: 30 minutes   SHOULDER ARTHROSCOPY WITH SUBACROMIAL DECOMPRESSION Left 01/20/2016   Procedure: LEFT SHOULDER ARTHROSCOPY WITH EXTENSIVE  DEBRIDEMENT, ACROMIOPLASTY;  Surgeon: Ninetta Lights, MD;  Location: [place];  Service: Orthopedics;  Laterality: Left;   SHOULDER SURGERY  LFT   Stress Cardiolite  12/24/2003   negative for ischemia   UPPER ESOPHAGEAL ENDOSCOPIC ULTRASOUND (EUS)  04/18/2016   [facility]   UPPER GASTROINTESTINAL ENDOSCOPY     WOUND EXPLORATION     management of a wound that he sustained in the [employer]    Social History   Socioeconomic History   Marital status: Widowed    Spouse name: Deneise Lever   Number of children: 3   Years of education: 12+   Highest education level: Not on file  Occupational History   Occupation: Event organiser; Banker: RETIRED    Comment: retired from Event organiser  Tobacco Use   Smoking status: Never   Smokeless tobacco: Never  Vaping Use   Vaping Use: Never used  Substance and Sexual Activity   Alcohol use: Not Currently    Alcohol/week: 0.0 standard drinks   Drug use: No   Sexual activity: Not Currently    Partners: Female   Other Topics Concern   Not on file  Social History Narrative   Married-divorced '89, remarried '96. 2 sons, 1 daughter. 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 He wishes to be a full resuscitation  candidate.       Social Determinants of Health   Financial Resource Strain: Low Risk    Difficulty of Paying Living Expenses: Not hard at all  Food Insecurity: No Food Insecurity   Worried About Charity fundraiser in the Last Year: Never true   [place] in the Last Year: Never true  Transportation Needs: No Transportation Needs   Lack of Transportation (Medical): No   Lack of Transportation (Non-Medical): No  Physical Activity: Inactive   Days of Exercise per Week: 0 days   Minutes of Exercise per Session: 0 min  Stress: No Stress Concern Present   Feeling of Stress : Not at all  Social Connections: Moderately Integrated   Frequency of Communication with Friends and Family: More than three times a week   Frequency of Social Gatherings with Friends and Family: Once a week   Attends Religious Services: More than 4 times per year   Active Member of [employer] or Organizations: No   Attends Music therapist: More than 4 times per year   Marital Status: Divorced  Human resources officer Violence: Not At Risk   Fear of Current or Ex-Partner: No   Emotionally Abused: No   Physically Abused: No   Sexually Abused: No    Family History  Problem Relation Age of Onset   Cancer Sister 69       Cancer, unsure type   Hypertension Other    Colon cancer Neg Hx    Esophageal cancer Neg Hx    Stomach cancer Neg Hx    Diabetes Neg Hx    Colon polyps Neg Hx    Rectal cancer Neg Hx      Review of Systems  Constitutional: Negative.  Negative for chills and fever.  HENT: Negative.  Negative for congestion and sore throat.   Respiratory:  Positive for shortness of breath. Negative for cough and hemoptysis.   Cardiovascular:   Positive for leg swelling. Negative for chest pain and palpitations.  Gastrointestinal:  Negative for abdominal pain, blood in stool, melena, nausea and vomiting.  Skin: Negative.  Negative for rash.  Neurological:  Negative for dizziness and headaches.  All other systems reviewed and are negative. Today's Vitals   07/20/21 1043  BP: 140/62  Pulse: 78  Temp: 98 F (36.7 C)  TempSrc: Oral  SpO2: 97%  Weight: 186 lb (84.4 kg)  Height: '5\' 11"'$  (1.803 m)   Body mass index is 25.94 kg/m. Wt Readings from Last 3 Encounters:  07/20/21 186 lb (84.4 kg)  07/05/21 177 lb 3.2 oz (80.4 kg)  06/14/21 178 lb 6.4 oz (80.9 kg)     Physical Exam Vitals reviewed.  Constitutional:      Appearance: Normal appearance.  HENT:     Head: Normocephalic.  Eyes:     Extraocular Movements: Extraocular movements intact.     Pupils: Pupils are equal, round, and reactive to light.  Cardiovascular:     Rate and Rhythm: Normal rate and regular rhythm.     Pulses: Normal pulses.     Heart sounds: Normal heart sounds.  Pulmonary:     Breath sounds: Rales (Both bases) present.  Abdominal:     Palpations: Abdomen is soft.     Tenderness: There is no abdominal tenderness.  Musculoskeletal:     Cervical back: No tenderness.     Right lower leg: Edema present.     Left lower leg: Edema present.  Lymphadenopathy:  Cervical: No cervical adenopathy.  Skin:    General: Skin is warm and dry.  Neurological:     General: No focal deficit present.     Mental Status: He is alert and oriented to person, place, and time.  Psychiatric:        Mood and Affect: Mood normal.        Behavior: Behavior normal.    DG Chest 2 View  Result Date: 07/20/2021 CLINICAL DATA:  Dyspnea on exertion for 2 months EXAM: CHEST - 2 VIEW COMPARISON:  Chest radiograph 05/23/2021 FINDINGS: The heart is at the upper limits of normal for size. The mediastinal contours are within normal limits. There is no focal consolidation or  pulmonary edema. There is no pleural effusion or pneumothorax. There is no acute osseous abnormality. IMPRESSION: No radiographic evidence of acute cardiopulmonary process. Electronically Signed   By: Valetta Mole M.D.   On: 07/20/2021 11:34    ASSESSMENT & PLAN: A total of 50 minutes was spent with the patient and counseling/coordination of care regarding preparing for this visit, review of most recent office visit notes, review of multiple chronic medical problems and their management, review of all medications, differential diagnosis of dyspnea on exertion, need for blood work, review of chest x-ray images, management approach, prognosis, documentation and need for follow-up with PCP next week.  Problem List Items Addressed This Visit       Cardiovascular and Mediastinum   Essential hypertension   Relevant Medications   furosemide (LASIX) 40 MG tablet     Endocrine   Diabetic renal disease (HCC)     Genitourinary   CKD (chronic kidney disease) stage 5, GFR less than 15 ml/min (HCC)     Other   Dyspnea on exertion - Primary    Differential diagnosis discussed with patient. No history of COPD or asthma. There are signs and symptoms of volume overload including 9 pound weight gain over the past 10 days.  Has peripheral edema and bibasilar crackles on physical exam. May benefit from Lasix 40 mg daily. Blood work including BNP done today. Has history of anemia.  CBC done today. Doubt pulmonary embolism. Afebrile.  No signs of infectious process. No chest pain but history of diabetes.  Doubt ischemic event. History of hypertension.  May have hypertensive heart disease. History of metastatic renal cancer and chronic kidney disease stage 5 Volume overload secondary to end-stage renal disease also possibility.  May be reaching dialysis stage. Still making urine. Follow-up next week with PCP, Dr. Scarlette Calico.      Relevant Medications   furosemide (LASIX) 40 MG tablet   Other  Relevant Orders   CBC with Differential/Platelet   Comprehensive metabolic panel   B Nat Peptide   DG Chest 2 View (Completed)     Patient Instructions  Heart Failure, Diagnosis Heart failure means that your heart is not able to pump blood in the right way. This makes it hard for your body to work well. Heart failure is usually a long-term (chronic) condition. You must take good care of yourself and follow your treatment plan from your doctor. Different stages of heart failure have different treatment plans. The stages are: Stage A: At risk for heart failure. Stage B: Pre-heart failure. Stage C: Symptomatic heart failure. Stage D: Advanced heart failure. What are the causes? High blood pressure. Buildup of cholesterol and fat in the arteries. Heart attack. This injures the heart muscle. Heart valves that do not open and close properly. Damage  of the heart muscle. This is also called cardiomyopathy. Infection of the heart muscle. This is also called myocarditis. Lung disease. What increases the risk? Getting older. The risk of heart failure goes up as a person ages. Being overweight. Using tobacco or nicotine products. Abusing alcohol or drugs. Having taken medicines that can damage the heart. Having any of these conditions: Diabetes. Abnormal heart rhythms. Thyroid problems. Low blood counts (anemia). Having a family history of heart failure. What are the signs or symptoms? Shortness of breath. Coughing. Swelling of the feet, ankles, legs, or belly. Losing or gaining weight for no reason. Trouble breathing. Waking from sleep because of the need to sit up and get more air. Fast heartbeat. Other symptoms may include: Being very tired. Feeling dizzy, or feeling like you may pass out (faint). Having no desire to eat. Feeling like you may vomit (nauseous). Peeing (urinating) more at night. Feeling confused. How is this treated? This condition may be treated  with: Medicines. These can be given to treat blood pressure and to make the heart muscles stronger. Changes in your daily life. These may include: Eating a healthy diet. Staying at a healthy body weight. Quitting tobacco, alcohol, and drug use. Doing exercises. Participating in a cardiac rehabilitation program. This program helps you improve your health through exercise, education, and counseling. Surgery. Surgery can be done to open blocked valves or to put devices in the heart, such as pacemakers. A donor heart (heart transplant). You will receive a healthy heart from a donor. Follow these instructions at home: Treat other conditions as told by your doctor. These may include high blood pressure, diabetes, thyroid disease, or abnormal heart rhythms. Learn as much as you can about heart failure. Get support as you need it. Keep all follow-up visits. Where to find more information American Heart Association: www.heart.org Centers for Disease Control and Prevention: http://www.wolf.info/ National Institute on Aging: http://kim-miller.com/ Summary Heart failure means that your heart is not able to pump blood in the right way. This condition is often caused by high blood pressure, heart attack, or damage of the heart muscle. Symptoms of this condition include shortness of breath and swelling of the feet, ankles, legs, or belly. You may also feel very tired or feel like you may vomit. You may be treated with medicines, surgery, or changes in your daily life. Treat other health conditions as told by your doctor. This information is not intended to replace advice given to you by your health care provider. Make sure you discuss any questions you have with your health care provider. Document Revised: 10/28/2020 Document Reviewed: 11/22/2019 Elsevier Patient Education  2022 [place], MD [facility]

## 2021-07-21 ENCOUNTER — Inpatient Hospital Stay (HOSPITAL_COMMUNITY): Payer: Medicare Other

## 2021-07-21 DIAGNOSIS — I5031 Acute diastolic (congestive) heart failure: Secondary | ICD-10-CM

## 2021-07-21 LAB — CBC
HCT: 23.8 % — ABNORMAL LOW (ref 39.0–52.0)
HCT: 27.5 % — ABNORMAL LOW (ref 39.0–52.0)
Hemoglobin: 7.7 g/dL — ABNORMAL LOW (ref 13.0–17.0)
Hemoglobin: 8.9 g/dL — ABNORMAL LOW (ref 13.0–17.0)
MCH: 30.1 pg (ref 26.0–34.0)
MCH: 30.4 pg (ref 26.0–34.0)
MCHC: 32.4 g/dL (ref 30.0–36.0)
MCHC: 32.4 g/dL (ref 30.0–36.0)
MCV: 93 fL (ref 80.0–100.0)
MCV: 93.9 fL (ref 80.0–100.0)
Platelets: 234 10*3/uL (ref 150–400)
Platelets: 191 10*3/uL (ref 150–400)
RBC: 2.56 MIL/uL — ABNORMAL LOW (ref 4.22–5.81)
RBC: 2.93 MIL/uL — ABNORMAL LOW (ref 4.22–5.81)
RDW: 17.4 % — ABNORMAL HIGH (ref 11.5–15.5)
RDW: 17 % — ABNORMAL HIGH (ref 11.5–15.5)
WBC: 4.7 10*3/uL (ref 4.0–10.5)
WBC: 4.1 10*3/uL (ref 4.0–10.5)
nRBC: 0 % (ref 0.0–0.2)
nRBC: 0 % (ref 0.0–0.2)

## 2021-07-21 LAB — HEMOGLOBIN AND HEMATOCRIT, BLOOD
HCT: 20.6 % — ABNORMAL LOW (ref 39.0–52.0)
HCT: 27.9 % — ABNORMAL LOW (ref 39.0–52.0)
Hemoglobin: 6.8 g/dL — CL (ref 13.0–17.0)
Hemoglobin: 9 g/dL — ABNORMAL LOW (ref 13.0–17.0)

## 2021-07-21 LAB — BASIC METABOLIC PANEL
Anion gap: 6 (ref 5–15)
BUN: 40 mg/dL — ABNORMAL HIGH (ref 8–23)
CO2: 10 mmol/L — ABNORMAL LOW (ref 22–32)
Calcium: 9.4 mg/dL (ref 8.9–10.3)
Chloride: 118 mmol/L — ABNORMAL HIGH (ref 98–111)
Creatinine, Ser: 4.03 mg/dL — ABNORMAL HIGH (ref 0.61–1.24)
GFR, Estimated: 15 mL/min — ABNORMAL LOW (ref >60)
Glucose, Bld: 67 mg/dL — ABNORMAL LOW (ref 70–99)
Potassium: 6.1 mmol/L — ABNORMAL HIGH (ref 3.5–5.1)
Sodium: 134 mmol/L — ABNORMAL LOW (ref 135–145)

## 2021-07-21 LAB — ECHOCARDIOGRAM COMPLETE
Area-P 1/2: 2.73 cm2
Calc EF: 49.7 %
Height: 71 in
S' Lateral: 3.5 cm
Single Plane A2C EF: 49.4 %
Single Plane A4C EF: 52 %
Weight: 2977.09 oz

## 2021-07-21 LAB — CBG MONITORING, ED: Glucose-Capillary: 112 mg/dL — ABNORMAL HIGH (ref 70–99)

## 2021-07-21 LAB — BLOOD GAS, VENOUS
Acid-base deficit: 15 mmol/L — ABNORMAL HIGH (ref 0.0–2.0)
Bicarbonate: 11.1 mmol/L — ABNORMAL LOW (ref 20.0–28.0)
O2 Saturation: 60.7 %
Patient temperature: 36.6
pCO2, Ven: 26 mmHg — ABNORMAL LOW (ref 44–60)
pH, Ven: 7.23 — ABNORMAL LOW (ref 7.25–7.43)
pO2, Ven: 37 mmHg (ref 32–45)

## 2021-07-21 LAB — PREPARE RBC (CROSSMATCH)

## 2021-07-21 LAB — POTASSIUM: Potassium: 5.8 mmol/L — ABNORMAL HIGH (ref 3.5–5.1)

## 2021-07-21 MED ORDER — NEPRO/CARBSTEADY PO LIQD
237.0000 mL | Freq: Two times a day (BID) | ORAL | Status: DC
  Administered 2021-07-22: 237 mL via ORAL
  Filled 2021-07-21 (×2): qty 237

## 2021-07-21 MED ORDER — SODIUM CHLORIDE 0.9% IV SOLUTION
Freq: Once | INTRAVENOUS | Status: DC
Start: 2021-07-21 — End: 2021-07-22

## 2021-07-21 MED ORDER — SODIUM ZIRCONIUM CYCLOSILICATE 10 G PO PACK
10.0000 g | PACK | Freq: Three times a day (TID) | ORAL | Status: AC
  Administered 2021-07-21 (×3): 10 g via ORAL
  Filled 2021-07-21 (×3): qty 1

## 2021-07-21 MED ORDER — SODIUM BICARBONATE 8.4 % IV SOLN
INTRAVENOUS | Status: DC
  Filled 2021-07-21: qty 150
  Filled 2021-07-21: qty 1000

## 2021-07-21 MED ORDER — SODIUM CHLORIDE 0.9% IV SOLUTION: Freq: Once | INTRAVENOUS | Status: DC

## 2021-07-21 MED ORDER — ACETAMINOPHEN 325 MG PO TABS
650.0000 mg | ORAL_TABLET | Freq: Once | ORAL | Status: DC
  Filled 2021-07-21: qty 2

## 2021-07-21 MED ORDER — DIPHENHYDRAMINE HCL 50 MG/ML IJ SOLN
25.0000 mg | Freq: Once | INTRAMUSCULAR | Status: AC
  Administered 2021-07-21: 05:00:00 25 mg via INTRAVENOUS
  Filled 2021-07-21: qty 1

## 2021-07-21 NOTE — Plan of Care (Signed)

## 2021-07-21 NOTE — ED Notes (Signed)
Blood bank has a unit of blood ready for this patient. Nurse, Edward Qualia informed. ?

## 2021-07-21 NOTE — ED Notes (Signed)
Pt glucose noted to be 67 on his morning lab work. Gave pt sandwich and juice with improvement in his glucose.  ?

## 2021-07-21 NOTE — Progress Notes (Signed)
PROGRESS NOTE  Antonio Adams CMK:[id number] DOB: 01-03-1946 DOA: 07/20/2021 PCP: [name], MD  HPI/Recap of past 24 hours: Antonio Adams is a 76 y.o. male with medical history significant of CKD 5, Diet controlled DM, OA, BPH, hx of prostate cancer and renal cell carcinoma who presents worsening anemia, kidney function, and hyperkalemia after having labs at PCP earlier today. Labs repeat in ER and has hyperkalemia confirmed with no hemolysis. Has worsening anemia.  Has no report of abnormal bleeding or bruising and has not had black tarry stools he states.  Reports he did have to have PRBC transfusion in January for anemia.  Wife reports he has been having increase in SOB with exertion the past few weeks. No syncope. Denies Chest pain or palpitations.  Had low blood sugar and A1c in January so was taken off insulin at that time and is controlling with diet.  07/21/2021: Patient was seen and examined with his wife present at bedside.  Reports persistent dyspnea with minimal exertion.  Also endorses bilateral lower extremity edema.  2D echo ordered due to concern for congestive heart failure.  An additional 1 unit PRBCs ordered to maintain hemoglobin greater than 8.0 due to symptomatic anemia.    Assessment/Plan: Principal Problem:   Hyperkalemia Active Problems:   CKD (chronic kidney disease) stage 5, GFR less than 15 ml/min (HCC)   Anemia due to chronic kidney disease   Essential hypertension   Type 2 diabetes mellitus with diabetic cataract (HCC)  Hyperkalemia secondary to medication noncompliance in the setting of advanced chronic kidney disease. Per his wife, he stopped taking his Lokelma due to bilateral lower extremity edema. Initial serum potassium on admission 6.4 Given Lokelma, lasix, insulin and D50 in the ER Serum potassium uprising once more Continue Lokelma 3 times daily Repeat renal panel in the morning.  Symptomatic anemia in the setting of chronic kidney  disease and iron deficiency CKD 5, on iron supplement prior to admission. Presented with hemoglobin of 7.2, repeated 6.8. Post total 2 unit PRBCs transfusion, repeat CBC post transfusions. After 1 unit PRBC hemoglobin corrected to 7.7 however the patient was still symptomatic, 1 unit PRBC was ordered additionally. No overt bleeding, negative FOBT.  Non anion gap metabolic acidosis in the setting of advanced CKD Serum bicarb 10 Anion gap 6 Started bicarb drip on 07/21/2021 at 50 cc/h x 2 days. Repeat renal panel in the morning.  Bilateral lower extremity edema suspect multifactorial secondary to advanced CKD, rule out cardiogenic cause Follow 2D echo obtained on 07/21/2021. Closely monitor volume status while on bicarb drip Start strict I's and O's and daily weight.   CKD (chronic kidney disease) stage 5, GFR less than 15 ml/min (HCC) Stable. Pt refuses to have dialysis no matter what his renal function becomes he states. Continue to avoid nephrotoxic agents, dehydration and hypotension. Monitor urine output with strict I's and O's.   Essential hypertension BP stable Not onl oral antihypertensives.   Prediabetes with diabetic cataract ([place]) Pt is diet controlled. Had HgbA1c of 5.8 on Jan. 8th,2023 so will not recheck at this time. Avoid hypoglycemia.  Physical debility PT OT to assess Fall precautions.   Advance Care Planning: Code Status:  Full Code.      SCDs for DVT prophylaxis.   Family Communication: Plan discussed with the patient and his wife at bedside.     Critical care time: 65 minutes.     Status is: Inpatient Patient requires at least 2 midnights for  further evaluation and treatment of present condition.    Objective: Vitals:   07/21/21 0800 07/21/21 0822 07/21/21 0900 07/21/21 1211  BP: (!) 145/84  (!) 148/86 (!) 141/78  Pulse: 73  74 61  Resp: (!) 21  (!) 21 16  Temp:  98.7 F (37.1 C)    TempSrc:  Oral    SpO2: 100%  100% 100%  Weight:       Height:        Intake/Output Summary (Last 24 hours) at 07/21/2021 1413 Last data filed at 07/21/2021 1018 Gross per 24 hour  Intake 384.33 ml  Output 600 ml  Net -215.67 ml   Filed Weights   07/21/21 0143  Weight: 84.4 kg    Exam:  General: 76 y.o. year-old male well developed well nourished in no acute distress.  Alert and oriented x3. Cardiovascular: Regular rate and rhythm with no rubs or gallops.  No thyromegaly or JVD noted.   Respiratory: Clear to auscultation with no wheezes or rales. Good inspiratory effort. Abdomen: Soft nontender nondistended with normal bowel sounds x4 quadrants. Musculoskeletal: Trace lower extremity edema bilaterally. Skin: No ulcerative lesions noted or rashes, Psychiatry: Mood is appropriate for condition and setting Neuro: Moves all 4 extremities.   Data Reviewed: CBC: Recent Labs  Lab 07/20/21 1119 07/20/21 2020 07/21/21 0110 07/21/21 0520  WBC 4.3 3.7*  --  4.7  NEUTROABS 3.0 2.6  --   --   HGB 8.0 Repeated and verified X2.* 7.2* 6.8* 7.7*  HCT 24.0* 21.9* 20.6* 23.8*  MCV 93.0 92.4  --  93.0  PLT 219.0 213  --  191   Basic Metabolic Panel: Recent Labs  Lab 07/20/21 1119 07/20/21 2020 07/21/21 0110 07/21/21 0520  NA 135 136  --  134*  K 5.7* 6.4* 5.8* 6.1*  CL 115* 117*  --  118*  CO2 12* 12*  --  10*  GLUCOSE 63* 114*  --  67*  BUN 36* 36*  --  40*  CREATININE 4.14* 4.13*  --  4.03*  CALCIUM 9.6 9.3  --  9.4   GFR: Estimated Creatinine Clearance: 16.9 mL/min (A) (by C-G formula based on SCr of 4.03 mg/dL (H)). Liver Function Tests: Recent Labs  Lab 07/20/21 1119 07/20/21 2020  AST 33 33  ALT 23 25  ALKPHOS 105 88  BILITOT 0.3 0.1*  PROT 6.6 6.1*  ALBUMIN 3.7 2.8*   No results for input(s): LIPASE, AMYLASE in the last 168 hours. No results for input(s): AMMONIA in the last 168 hours. Coagulation Profile: No results for input(s): INR, PROTIME in the last 168 hours. Cardiac Enzymes: No results for input(s):  CKTOTAL, CKMB, CKMBINDEX, TROPONINI in the last 168 hours. BNP (last 3 results) Recent Labs    07/20/21 1119  PROBNP 119.0*   HbA1C: No results for input(s): HGBA1C in the last 72 hours. CBG: Recent Labs  Lab 07/21/21 0635  GLUCAP 112*   Lipid Profile: No results for input(s): CHOL, HDL, LDLCALC, TRIG, CHOLHDL, LDLDIRECT in the last 72 hours. Thyroid Function Tests: No results for input(s): TSH, T4TOTAL, FREET4, T3FREE, THYROIDAB in the last 72 hours. Anemia Panel: No results for input(s): VITAMINB12, FOLATE, FERRITIN, TIBC, IRON, RETICCTPCT in the last 72 hours. Urine analysis:    Component Value Date/Time   COLORURINE STRAW (A) 05/23/2021 0902   APPEARANCEUR CLEAR 05/23/2021 0902   LABSPEC 1.012 05/23/2021 0902   PHURINE 5.0 05/23/2021 0902   GLUCOSEU NEGATIVE 05/23/2021 0902   GLUCOSEU 250 (A) 05/20/2020  [place] (A) 05/23/2021 0902   BILIRUBINUR NEGATIVE 05/23/2021 0902   BILIRUBINUR neg 03/21/2012 1127   KETONESUR NEGATIVE 05/23/2021 0902   PROTEINUR 100 (A) 05/23/2021 0902   UROBILINOGEN 0.2 05/20/2020 1606   NITRITE NEGATIVE 05/23/2021 0902   LEUKOCYTESUR NEGATIVE 05/23/2021 0902   Sepsis Labs: '@LABRCNTIP'$ (procalcitonin:4,lacticidven:4)  ) Recent Results (from the past 240 hour(s))  Resp Panel by RT-PCR (Flu A&B, Covid) Nasopharyngeal Swab     Status: None   Collection Time: 07/20/21 10:20 PM   Specimen: Nasopharyngeal Swab; Nasopharyngeal(NP) swabs in vial transport medium  Result Value Ref Range Status   SARS Coronavirus 2 by RT PCR NEGATIVE NEGATIVE Final    Comment: (NOTE) SARS-CoV-2 target nucleic acids are NOT DETECTED.  The SARS-CoV-2 RNA is generally detectable in upper respiratory specimens during the acute phase of infection. The lowest concentration of SARS-CoV-2 viral copies this assay can detect is 138 copies/mL. A negative result does not preclude SARS-Cov-2 infection and should not be used as the sole basis for treatment  or other patient management decisions. A negative result may occur with  improper specimen collection/handling, submission of specimen other than nasopharyngeal swab, presence of viral mutation(s) within the areas targeted by this assay, and inadequate number of viral copies(<138 copies/mL). A negative result must be combined with clinical observations, patient history, and epidemiological information. The expected result is Negative.  Fact Sheet for Patients:  EntrepreneurPulse.com.au  Fact Sheet for Healthcare Providers:  IncredibleEmployment.be  This test is no t yet approved or cleared by the [place] FDA and  has been authorized for detection and/or diagnosis of SARS-CoV-2 by FDA under an Emergency Use Authorization (EUA). This EUA will remain  in effect (meaning this test can be used) for the duration of the COVID-19 declaration under Section 564(b)(1) of the Act, 21 U.S.C.section 360bbb-3(b)(1), unless the authorization is terminated  or revoked sooner.       Influenza A by PCR NEGATIVE NEGATIVE Final   Influenza B by PCR NEGATIVE NEGATIVE Final    Comment: (NOTE) The Xpert Xpress SARS-CoV-2/FLU/RSV plus assay is intended as an aid in the diagnosis of influenza from Nasopharyngeal swab specimens and should not be used as a sole basis for treatment. Nasal washings and aspirates are unacceptable for Xpert Xpress SARS-CoV-2/FLU/RSV testing.  Fact Sheet for Patients: EntrepreneurPulse.com.au  Fact Sheet for Healthcare Providers: IncredibleEmployment.be  This test is not yet approved or cleared by the [place] FDA and has been authorized for detection and/or diagnosis of SARS-CoV-2 by FDA under an Emergency Use Authorization (EUA). This EUA will remain in effect (meaning this test can be used) for the duration of the COVID-19 declaration under Section 564(b)(1) of the Act, 21 U.S.C. section  360bbb-3(b)(1), unless the authorization is terminated or revoked.  Performed at [facility], [place] [address] [place], [place] [address]       Studies: DG Chest 2 View  Result Date: 07/20/2021 CLINICAL DATA:  Shortness of breath.  Abnormal kidney function. EXAM: CHEST - 2 VIEW COMPARISON:  Radiograph earlier today. FINDINGS: Lower lung volumes from exam earlier today. Stable heart size and mediastinal contours allowing for differences in technique. Minor basilar atelectasis without confluent consolidation. No pneumothorax, pleural effusion, or pulmonary edema. No acute osseous findings. IMPRESSION: Lower lung volumes from earlier today with mild basilar atelectasis. Electronically Signed   By: Keith Rake M.D.   On: 07/20/2021 20:39   ECHOCARDIOGRAM COMPLETE  Result Date: 07/21/2021    ECHOCARDIOGRAM REPORT  Patient Name:   Antonio Adams Date of Exam: 07/21/2021 Medical Rec #:  [id number]           Height:       71.0 in Accession #:    [id number]          Weight:       186.1 lb Date of Birth:  Aug 03, 1945          BSA:          2.045 m Patient Age:    40 years            BP:           148/86 mmHg Patient Gender: M                   HR:           72 bpm. Exam Location:  Inpatient Procedure: 2D Echo, 3D Echo, Cardiac Doppler and Color Doppler Indications:    CHF-Acute Diastolic N23.55  History:        Patient has no prior history of Echocardiogram examinations.                 Risk Factors:Sleep Apnea, Hypertension, Dyslipidemia and                 Diabetes.  Sonographer:    Bernadene Person RDCS Referring Phys: [id number]  1. Left ventricular ejection fraction, by estimation, is 50%. Left ventricular ejection fraction by 3D volume is 48 %. The left ventricle has mildly decreased function. The left ventricle demonstrates global hypokinesis. Left ventricular diastolic parameters are consistent with Grade I diastolic dysfunction (impaired relaxation).  2.  Right ventricular systolic function is normal. The right ventricular size is normal.  3. The mitral valve is normal in structure. No evidence of mitral valve regurgitation. No evidence of mitral stenosis.  4. The aortic valve is tricuspid. Aortic valve regurgitation is not visualized. Aortic valve sclerosis is present, with no evidence of aortic valve stenosis.  5. The inferior vena cava is normal in size with greater than 50% respiratory variability, suggesting right atrial pressure of 3 mmHg. Comparison(s): No prior Echocardiogram. FINDINGS  Left Ventricle: Left ventricular ejection fraction, by estimation, is 50%. Left ventricular ejection fraction by 3D volume is 48 %. The left ventricle has mildly decreased function. The left ventricle demonstrates global hypokinesis. The left ventricular internal cavity size was normal in size. There is no left ventricular hypertrophy. Left ventricular diastolic parameters are consistent with Grade I diastolic dysfunction (impaired relaxation). Right Ventricle: The right ventricular size is normal. No increase in right ventricular wall thickness. Right ventricular systolic function is normal. Left Atrium: Left atrial size was normal in size. Right Atrium: Right atrial size was normal in size. Pericardium: There is no evidence of pericardial effusion. Mitral Valve: The mitral valve is normal in structure. No evidence of mitral valve regurgitation. No evidence of mitral valve stenosis. Tricuspid Valve: The tricuspid valve is normal in structure. Tricuspid valve regurgitation is not demonstrated. Aortic Valve: The aortic valve is tricuspid. Aortic valve regurgitation is not visualized. Aortic valve sclerosis is present, with no evidence of aortic valve stenosis. Pulmonic Valve: The pulmonic valve was not well visualized. Pulmonic valve regurgitation is not visualized. Aorta: The aortic root and ascending aorta are structurally normal, with no evidence of dilitation. Venous: The  inferior vena cava is normal in size with greater than 50% respiratory variability, suggesting right atrial pressure of 3 mmHg.  IAS/Shunts: No atrial level shunt detected by color flow Doppler.  LEFT VENTRICLE PLAX 2D LVIDd:         4.80 cm         Diastology LVIDs:         3.50 cm         LV e' medial:    4.95 cm/s LV PW:         1.00 cm         LV E/e' medial:  10.4 LV IVS:        1.20 cm         LV e' lateral:   6.38 cm/s LVOT diam:     2.20 cm         LV E/e' lateral: 8.1 LV SV:         73 LV SV Index:   36 LVOT Area:     3.80 cm        3D Volume EF                                LV 3D EF:    Left                                             ventricul LV Volumes (MOD)                            ar LV vol d, MOD    127.0 ml                   ejection A2C:                                        fraction LV vol d, MOD    129.0 ml                   by 3D A4C:                                        volume is LV vol s, MOD    64.2 ml                    48 %. A2C: LV vol s, MOD    61.9 ml A4C:                           3D Volume EF: LV SV MOD A2C:   62.8 ml       3D EF:        48 % LV SV MOD A4C:   129.0 ml      LV EDV:       171 ml LV SV MOD BP:    65.9 ml       LV ESV:       89 ml                                LV SV:  82 ml RIGHT VENTRICLE RV S prime:     10.60 cm/s TAPSE (M-mode): 1.8 cm LEFT ATRIUM           Index        RIGHT ATRIUM           Index LA diam:      3.90 cm 1.91 cm/m   RA Area:     17.20 cm LA Vol (A2C): 35.4 ml 17.31 ml/m  RA Volume:   41.30 ml  20.20 ml/m LA Vol (A4C): 36.0 ml 17.61 ml/m  AORTIC VALVE LVOT Vmax:   95.10 cm/s LVOT Vmean:  61.300 cm/s LVOT VTI:    0.191 m  AORTA Ao Root diam: 3.30 cm Ao Asc diam:  3.20 cm MITRAL VALVE MV Area (PHT): 2.73 cm    SHUNTS MV Decel Time: 278 msec    Systemic VTI:  0.19 m MV E velocity: 51.40 cm/s  Systemic Diam: 2.20 cm MV A velocity: 50.70 cm/s MV E/A ratio:  1.01 Rudean Haskell MD Electronically signed by Rudean Haskell MD  Signature Date/Time: 07/21/2021/1:59:19 PM    Final     Scheduled Meds:  sodium chloride   Intravenous Once   sodium chloride   Intravenous Once   acetaminophen  650 mg Oral Once   atorvastatin  20 mg Oral QHS   furosemide  40 mg Oral Daily   megestrol  400 mg Oral Daily   sodium chloride flush  3 mL Intravenous Q12H   sodium zirconium cyclosilicate  10 g Oral TID   tamsulosin  0.4 mg Oral Daily    Continuous Infusions:  sodium chloride Stopped (07/21/21 1018)    sodium bicarbonate (isotonic) infusion in sterile water 50 mL/hr at 07/21/21 1214     LOS: 1 day     Kayleen Memos, MD [facility] Pager [phone]  If 7PM-7AM, please contact night-coverage [email or web address] Password [email or web address] 07/21/2021, 2:13 PM

## 2021-07-21 NOTE — ED Notes (Signed)
Pt. CBG 112, RN made aware. ?

## 2021-07-21 NOTE — Progress Notes (Signed)
?  Echocardiogram ?2D Echocardiogram has been performed. ? ?Fidel Levy ?07/21/2021, 11:56 AM ?

## 2021-07-21 NOTE — Progress Notes (Addendum)
Cardiology has been consulted for goal-directed medical therapy due to abnormal findings, combined diastolic and systolic CHF with LVEF by 3D volume 10%, grade 1 diastolic dysfunction, LV global hypokinesis on 2D echo done on 07/21/2021.   ? ?Will obtain fasting lipid panel in the morning.  Patient is currently on moderate intensity statin therapy, home Lipitor 20 mg nightly. ?

## 2021-07-21 NOTE — Progress Notes (Signed)
Called the patient's significant other to give updates.  No answer.  Left non-confidential voicemail message. ? ?

## 2021-07-22 DIAGNOSIS — I5032 Chronic diastolic (congestive) heart failure: Secondary | ICD-10-CM

## 2021-07-22 DIAGNOSIS — R600 Localized edema: Secondary | ICD-10-CM

## 2021-07-22 DIAGNOSIS — N185 Chronic kidney disease, stage 5: Secondary | ICD-10-CM

## 2021-07-22 DIAGNOSIS — D631 Anemia in chronic kidney disease: Secondary | ICD-10-CM

## 2021-07-22 DIAGNOSIS — I429 Cardiomyopathy, unspecified: Secondary | ICD-10-CM

## 2021-07-22 LAB — BPAM RBC
Blood Product Expiration Date: 202304022359
Blood Product Expiration Date: 202304042359
ISSUE DATE / TIME: 202303090528
ISSUE DATE / TIME: 202303091615
Unit Type and Rh: 5100
Unit Type and Rh: 5100

## 2021-07-22 LAB — TYPE AND SCREEN
ABO/RH(D): O POS
Antibody Screen: NEGATIVE
Donor AG Type: NEGATIVE
Donor AG Type: NEGATIVE
Unit division: 0
Unit division: 0

## 2021-07-22 LAB — CBC
HCT: 30 % — ABNORMAL LOW (ref 39.0–52.0)
Hemoglobin: 10.2 g/dL — ABNORMAL LOW (ref 13.0–17.0)
MCH: 31 pg (ref 26.0–34.0)
MCHC: 34 g/dL (ref 30.0–36.0)
MCV: 91.2 fL (ref 80.0–100.0)
Platelets: 217 10*3/uL (ref 150–400)
RBC: 3.29 MIL/uL — ABNORMAL LOW (ref 4.22–5.81)
RDW: 16.6 % — ABNORMAL HIGH (ref 11.5–15.5)
WBC: 4.7 10*3/uL (ref 4.0–10.5)
nRBC: 0 % (ref 0.0–0.2)

## 2021-07-22 LAB — LIPID PANEL
Cholesterol: 136 mg/dL (ref 0–200)
HDL: 34 mg/dL — ABNORMAL LOW (ref >40)
LDL Cholesterol: 93 mg/dL (ref 0–99)
Total CHOL/HDL Ratio: 4 RATIO
Triglycerides: 46 mg/dL (ref <150)
VLDL: 9 mg/dL (ref 0–40)

## 2021-07-22 LAB — COMPREHENSIVE METABOLIC PANEL
ALT: 24 U/L (ref 0–44)
AST: 32 U/L (ref 15–41)
Albumin: 3 g/dL — ABNORMAL LOW (ref 3.5–5.0)
Alkaline Phosphatase: 90 U/L (ref 38–126)
Anion gap: 7 (ref 5–15)
BUN: 40 mg/dL — ABNORMAL HIGH (ref 8–23)
CO2: 14 mmol/L — ABNORMAL LOW (ref 22–32)
Calcium: 9.3 mg/dL (ref 8.9–10.3)
Chloride: 116 mmol/L — ABNORMAL HIGH (ref 98–111)
Creatinine, Ser: 3.85 mg/dL — ABNORMAL HIGH (ref 0.61–1.24)
GFR, Estimated: 16 mL/min — ABNORMAL LOW (ref >60)
Glucose, Bld: 87 mg/dL (ref 70–99)
Potassium: 5.5 mmol/L — ABNORMAL HIGH (ref 3.5–5.1)
Sodium: 137 mmol/L (ref 135–145)
Total Bilirubin: 0.3 mg/dL (ref 0.3–1.2)
Total Protein: 6.2 g/dL — ABNORMAL LOW (ref 6.5–8.1)

## 2021-07-22 LAB — PHOSPHORUS: Phosphorus: 4.7 mg/dL — ABNORMAL HIGH (ref 2.5–4.6)

## 2021-07-22 LAB — MAGNESIUM: Magnesium: 1.8 mg/dL (ref 1.7–2.4)

## 2021-07-22 MED ORDER — SODIUM BICARBONATE 650 MG PO TABS
1300.0000 mg | ORAL_TABLET | Freq: Three times a day (TID) | ORAL | Status: DC
  Administered 2021-07-22 (×2): 1300 mg via ORAL
  Filled 2021-07-22 (×2): qty 2

## 2021-07-22 MED ORDER — RENA-VITE PO TABS: 1.0000 | ORAL_TABLET | Freq: Every day | ORAL | 0 refills | Status: AC

## 2021-07-22 MED ORDER — SODIUM BICARBONATE 650 MG PO TABS: 1300.0000 mg | ORAL_TABLET | Freq: Three times a day (TID) | ORAL | 0 refills | Status: AC

## 2021-07-22 MED ORDER — PROSOURCE PLUS PO LIQD: 30.0000 mL | Freq: Two times a day (BID) | ORAL | Status: DC

## 2021-07-22 MED ORDER — NEPRO/CARBSTEADY PO LIQD: 237.0000 mL | ORAL | Status: DC

## 2021-07-22 MED ORDER — METOPROLOL TARTRATE 25 MG PO TABS
12.5000 mg | ORAL_TABLET | Freq: Two times a day (BID) | ORAL | Status: DC
  Administered 2021-07-22: 12.5 mg via ORAL
  Filled 2021-07-22: qty 1

## 2021-07-22 MED ORDER — HEPARIN SODIUM (PORCINE) 5000 UNIT/ML IJ SOLN: 5000.0000 [IU] | Freq: Three times a day (TID) | INTRAMUSCULAR | Status: DC

## 2021-07-22 MED ORDER — SODIUM ZIRCONIUM CYCLOSILICATE 10 G PO PACK
10.0000 g | PACK | ORAL | Status: DC
  Administered 2021-07-22: 10 g via ORAL
  Filled 2021-07-22: qty 1

## 2021-07-22 MED ORDER — RENA-VITE PO TABS: 1.0000 | ORAL_TABLET | Freq: Every day | ORAL | Status: DC

## 2021-07-22 MED ORDER — METOPROLOL TARTRATE 25 MG PO TABS: 12.5000 mg | ORAL_TABLET | Freq: Two times a day (BID) | ORAL | 0 refills | Status: DC

## 2021-07-22 NOTE — Progress Notes (Signed)
Patient has been taught and explained discharge instructions. Patient has no further questions at this time. IV's have been removed and sites are clean, dry and intact. ? ?Layla Maw, RN ?

## 2021-07-22 NOTE — Discharge Summary (Addendum)
Discharge Summary  JAMILE SIVILS HUT:[id number] DOB: 1945-08-22  PCP: [name], MD  Admit date: 07/20/2021 Discharge date: 07/22/2021  Time spent: 35 minutes.  Recommendations for Outpatient Follow-up:  Follow-up with your nephrologist Dr. Hypertension in 1 to 2 weeks. Follow-up with cardiology in 1 to 2 weeks. Follow-up with your medical oncologist in 1 to 2 weeks. Follow-up with your primary care provider in 1 to 2 weeks. Take your medications as prescribed. Continue fall precautions. Obtain BMP or renal function test at your next nephrologist appointment.  Discharge Diagnoses:  Active Hospital Problems   Diagnosis Date Noted   Hyperkalemia 07/20/2021    Priority: 1.   CKD (chronic kidney disease) stage 5, GFR less than 15 ml/min (HCC) 05/24/2021    Priority: 2.   Anemia due to chronic kidney disease 07/20/2021    Priority: 3.   Essential hypertension 03/16/2007    Priority: 4.   Type 2 diabetes mellitus with diabetic cataract ([place]) 03/16/2007    Priority: 5.    Resolved Hospital Problems  No resolved problems to display.    Discharge Condition: Stable.  Diet recommendation: Resume previous diet.  Vitals:   07/22/21 0142 07/22/21 0408  BP: (!) 141/86 133/83  Pulse: 74 80  Resp: 20 20  Temp: 98.7 F (37.1 C) 98.7 F (37.1 C)  SpO2: 100% 100%    History of present illness:  KEVION FATHEREE is a 76 y.o. male with medical history significant of CKD 5 followed by nephrology Dr. Hollie Salk, prediabetes, OA, BPH, hx of prostate cancer and renal cell carcinoma who presented from home to [facility] ED due to abnormal labs results reported from PCPs office on the day of presentation.  Associated with worsening anemia, kidney function, and hyperkalemia.  No overt bleeding.  Wife reports worsening exertional dyspnea for the past few weeks. No syncope.  No chest pain or palpitations.  Work-up revealed symptomatic anemia requiring 2 units PRBCs transfusion for hemoglobin  of 6.8K.  Also revealed abnormal 2D echo done on 07/21/2021 showing low normal LVEF 50% and grade 1 diastolic dysfunction for which he was seen by cardiology.  Started on low-dose Lopressor 12.5 mg twice daily.  Cardiology signed off, will arrange for outpatient follow-up.  07/22/2021: Patient was seen at his bedside.  His wife was present in the room.  There were no acute events overnight.  He has no new complaints.  He is eager to go home.   Hospital Course:  Principal Problem:   Hyperkalemia Active Problems:   CKD (chronic kidney disease) stage 5, GFR less than 15 ml/min (HCC)   Anemia due to chronic kidney disease   Essential hypertension   Type 2 diabetes mellitus with diabetic cataract (HCC)  Hyperkalemia secondary to medication noncompliance in the setting of advanced chronic kidney disease. Per his wife, he stopped taking his Lokelma due to bilateral lower extremity edema. Initial serum potassium on admission 6.4 Given Lokelma, lasix, insulin and D50 in the ER Resume Lokelma home regimen. Recommend renal diet. follow-up with nephrology outpatient Repeat BMP on Thursday, 07/28/2021 at your nephrologist appointment.   Symptomatic anemia in the setting of chronic kidney disease and iron deficiency CKD 5, on iron supplement prior to admission. Post 2 unit PRBC transfusion for hemoglobin of 6.8K. At the time of discharge hemoglobin 10.2 on 07/22/2021. No overt bleeding, negative FOBT.   Non anion gap metabolic acidosis in the setting of advanced CKD Serum bicarb 10> 14, received bicarb drip. Continue p.o. bicarb 3 times  daily x7 days Follow-up with nephrology and repeated BMP on 07/28/2021.   Newly diagnosed chronic diastolic CHF 2D echo done on 07/21/2021 showing LVEF low normal 50% with grade 1 diastolic dysfunction Continue home Lasix Started on low-dose Lopressor 12.5 mg twice daily Follow-up with cardiology outpatient   CKD (chronic kidney disease) stage 5, GFR less than 15  ml/min (HCC) Stable. Pt refuses to have dialysis no matter what his renal function becomes he states. Continue to avoid nephrotoxic agents, dehydration and hypotension. Follow-up with nephrology outpatient   Essential hypertension Lopressor 12.5 mg twice daily as stated above.   Prediabetes with diabetic cataract ([place]) Pt is diet controlled. Had HgbA1c of 5.8 on Jan. 8th,2023 so will not recheck at this time. Follow-up with your primary care provider   Physical debility PT OT evaluation no OT follow-up recommended. Continue fall precautions.  Moderate malnutrition BMI 24 Moderate muscle mass loss. Continue oral supplements and encourage increase in oral protein calorie intake.     Advance Care Planning: Code Status:  Full Code.      Procedures: 2D echo  Consultations: Cardiology  Discharge Exam: BP 133/83 (BP Location: Left Arm)    Pulse 80    Temp 98.7 F (37.1 C) (Oral)    Resp 20    Ht '5\' 11"'$  (1.803 m)    Wt 80.7 kg    SpO2 100%    BMI 24.81 kg/m  General: 76 y.o. year-old male well developed well nourished in no acute distress.  Alert and oriented x3. Cardiovascular: Regular rate and rhythm with no rubs or gallops.  No thyromegaly or JVD noted.   Respiratory: Clear to auscultation with no wheezes or rales. Good inspiratory effort. Abdomen: Soft nontender nondistended with normal bowel sounds x4 quadrants. Musculoskeletal: Trace lower extremity edema bilaterally. Skin: No ulcerative lesions noted or rashes, Psychiatry: Mood is appropriate for condition and setting  Discharge Instructions You were cared for by a hospitalist during your hospital stay. If you have any questions about your discharge medications or the care you received while you were in the hospital after you are discharged, you can call the unit and asked to speak with the hospitalist on call if the hospitalist that took care of you is not available. Once you are discharged, your primary care physician  will handle any further medical issues. Please note that NO REFILLS for any discharge medications will be authorized once you are discharged, as it is imperative that you return to your primary care physician (or establish a relationship with a primary care physician if you do not have one) for your aftercare needs so that they can reassess your need for medications and monitor your lab values.   Allergies as of 07/22/2021       Reactions   Ace Inhibitors Swelling   lisinopril   Xyzal [levocetirizine] Other (See Comments)   Break out   Opdivo [nivolumab] Rash        Medication List     STOP taking these medications    fluocinonide-emollient 0.05 % cream Commonly known as: LIDEX-E   levocetirizine 5 MG tablet Commonly known as: XYZAL   metoCLOPramide 10 MG tablet Commonly known as: REGLAN       TAKE these medications    atorvastatin 20 MG tablet Commonly known as: LIPITOR Take 1 tablet (20 mg total) by mouth at bedtime.   ferrous sulfate 325 (65 FE) MG tablet Take 1 tablet (325 mg total) by mouth 2 (two) times daily. Patient needs  office visit for further refills What changed: additional instructions   FISH OIL PO Take 1 tablet by mouth daily.   furosemide 40 MG tablet Commonly known as: LASIX Take 1 tablet (40 mg total) by mouth daily.   gabapentin 100 MG capsule Commonly known as: NEURONTIN Take 100 mg by mouth at bedtime.   gemfibrozil 600 MG tablet Commonly known as: LOPID Take 600 mg by mouth 2 (two) times daily before a meal.   Geritol Liqd Take 10 mLs by mouth daily.   Insulin Pen Needle 32G X 6 MM Misc 1 Act by Does not apply route daily.   Lokelma 10 g Pack packet Generic drug: sodium zirconium cyclosilicate Take 10 g by mouth every Monday, Wednesday, and Friday.   megestrol 400 MG/10ML suspension Commonly known as: MEGACE Take 10 mLs (400 mg total) by mouth daily.   metoprolol tartrate 25 MG tablet Commonly known as: LOPRESSOR Take 0.5  tablets (12.5 mg total) by mouth 2 (two) times daily.   multivitamin Tabs tablet Take 1 tablet by mouth at bedtime.   ONE TOUCH ULTRA TEST test strip Generic drug: glucose blood USE 2 (TWO) TIMES DAILY. AND LANCETS 2/DAY 250.03   OVER THE COUNTER MEDICATION Take 1 tablet by mouth daily. Focus factor   polyvinyl alcohol 1.4 % ophthalmic solution Commonly known as: LIQUIFILM TEARS Place 1 drop into both eyes daily as needed for dry eyes.   PREVAGEN EXTRA STRENGTH PO Take 1 tablet by mouth daily.   sodium bicarbonate 650 MG tablet Take 2 tablets (1,300 mg total) by mouth 3 (three) times daily for 5 days.   solifenacin 5 MG tablet Commonly known as: VESICARE TAKE 1 TABLET (5 MG TOTAL) BY MOUTH DAILY.   tamsulosin 0.4 MG Caps capsule Commonly known as: FLOMAX TAKE 1 CAPSULE (0.4 MG TOTAL) BY MOUTH DAILY.       Allergies  Allergen Reactions   Ace Inhibitors Swelling    lisinopril   Xyzal [Levocetirizine] Other (See Comments)    Break out   Opdivo [Nivolumab] Rash    Follow-up Information     Buford Dresser, MD Follow up on 08/17/2021.   Specialty: Cardiology Why: '@10am'$  for hospital follow up. Please arrive 15 minutes early Contact information: [place] [address] [phone]                  The results of significant diagnostics from this hospitalization (including imaging, microbiology, ancillary and laboratory) are listed below for reference.    Significant Diagnostic Studies: DG Chest 2 View  Result Date: 07/20/2021 CLINICAL DATA:  Shortness of breath.  Abnormal kidney function. EXAM: CHEST - 2 VIEW COMPARISON:  Radiograph earlier today. FINDINGS: Lower lung volumes from exam earlier today. Stable heart size and mediastinal contours allowing for differences in technique. Minor basilar atelectasis without confluent consolidation. No pneumothorax, pleural effusion, or pulmonary edema. No acute osseous findings. IMPRESSION:  Lower lung volumes from earlier today with mild basilar atelectasis. Electronically Signed   By: Keith Rake M.D.   On: 07/20/2021 20:39   DG Chest 2 View  Result Date: 07/20/2021 CLINICAL DATA:  Dyspnea on exertion for 2 months EXAM: CHEST - 2 VIEW COMPARISON:  Chest radiograph 05/23/2021 FINDINGS: The heart is at the upper limits of normal for size. The mediastinal contours are within normal limits. There is no focal consolidation or pulmonary edema. There is no pleural effusion or pneumothorax. There is no acute osseous abnormality. IMPRESSION: No radiographic evidence of acute cardiopulmonary  process. Electronically Signed   By: Valetta Mole M.D.   On: 07/20/2021 11:34   ECHOCARDIOGRAM COMPLETE  Result Date: 07/21/2021    ECHOCARDIOGRAM REPORT   Patient Name:   EDMOND GINSBERG Date of Exam: 07/21/2021 Medical Rec #:  [id number]           Height:       71.0 in Accession #:    [id number]          Weight:       186.1 lb Date of Birth:  1946-03-28          BSA:          2.045 m Patient Age:    36 years            BP:           148/86 mmHg Patient Gender: M                   HR:           72 bpm. Exam Location:  Inpatient Procedure: 2D Echo, 3D Echo, Cardiac Doppler and Color Doppler Indications:    CHF-Acute Diastolic K93.81  History:        Patient has no prior history of Echocardiogram examinations.                 Risk Factors:Sleep Apnea, Hypertension, Dyslipidemia and                 Diabetes.  Sonographer:    Bernadene Person RDCS Referring Phys: [id number] [place]  1. Left ventricular ejection fraction, by estimation, is 50%. Left ventricular ejection fraction by 3D volume is 48 %. The left ventricle has mildly decreased function. The left ventricle demonstrates global hypokinesis. Left ventricular diastolic parameters are consistent with Grade I diastolic dysfunction (impaired relaxation).  2. Right ventricular systolic function is normal. The right ventricular size is normal.  3.  The mitral valve is normal in structure. No evidence of mitral valve regurgitation. No evidence of mitral stenosis.  4. The aortic valve is tricuspid. Aortic valve regurgitation is not visualized. Aortic valve sclerosis is present, with no evidence of aortic valve stenosis.  5. The inferior vena cava is normal in size with greater than 50% respiratory variability, suggesting right atrial pressure of 3 mmHg. Comparison(s): No prior Echocardiogram. FINDINGS  Left Ventricle: Left ventricular ejection fraction, by estimation, is 50%. Left ventricular ejection fraction by 3D volume is 48 %. The left ventricle has mildly decreased function. The left ventricle demonstrates global hypokinesis. The left ventricular internal cavity size was normal in size. There is no left ventricular hypertrophy. Left ventricular diastolic parameters are consistent with Grade I diastolic dysfunction (impaired relaxation). Right Ventricle: The right ventricular size is normal. No increase in right ventricular wall thickness. Right ventricular systolic function is normal. Left Atrium: Left atrial size was normal in size. Right Atrium: Right atrial size was normal in size. Pericardium: There is no evidence of pericardial effusion. Mitral Valve: The mitral valve is normal in structure. No evidence of mitral valve regurgitation. No evidence of mitral valve stenosis. Tricuspid Valve: The tricuspid valve is normal in structure. Tricuspid valve regurgitation is not demonstrated. Aortic Valve: The aortic valve is tricuspid. Aortic valve regurgitation is not visualized. Aortic valve sclerosis is present, with no evidence of aortic valve stenosis. Pulmonic Valve: The pulmonic valve was not well visualized. Pulmonic valve regurgitation is not visualized. Aorta: The aortic root and ascending aorta are  structurally normal, with no evidence of dilitation. Venous: The inferior vena cava is normal in size with greater than 50% respiratory variability,  suggesting right atrial pressure of 3 mmHg. IAS/Shunts: No atrial level shunt detected by color flow Doppler.  LEFT VENTRICLE PLAX 2D LVIDd:         4.80 cm         Diastology LVIDs:         3.50 cm         LV e' medial:    4.95 cm/s LV PW:         1.00 cm         LV E/e' medial:  10.4 LV IVS:        1.20 cm         LV e' lateral:   6.38 cm/s LVOT diam:     2.20 cm         LV E/e' lateral: 8.1 LV SV:         73 LV SV Index:   36 LVOT Area:     3.80 cm        3D Volume EF                                LV 3D EF:    Left                                             ventricul LV Volumes (MOD)                            ar LV vol d, MOD    127.0 ml                   ejection A2C:                                        fraction LV vol d, MOD    129.0 ml                   by 3D A4C:                                        volume is LV vol s, MOD    64.2 ml                    48 %. A2C: LV vol s, MOD    61.9 ml A4C:                           3D Volume EF: LV SV MOD A2C:   62.8 ml       3D EF:        48 % LV SV MOD A4C:   129.0 ml      LV EDV:       171 ml LV SV MOD BP:    65.9 ml       LV ESV:       89 ml  LV SV:        82 ml RIGHT VENTRICLE RV S prime:     10.60 cm/s TAPSE (M-mode): 1.8 cm LEFT ATRIUM           Index        RIGHT ATRIUM           Index LA diam:      3.90 cm 1.91 cm/m   RA Area:     17.20 cm LA Vol (A2C): 35.4 ml 17.31 ml/m  RA Volume:   41.30 ml  20.20 ml/m LA Vol (A4C): 36.0 ml 17.61 ml/m  AORTIC VALVE LVOT Vmax:   95.10 cm/s LVOT Vmean:  61.300 cm/s LVOT VTI:    0.191 m  AORTA Ao Root diam: 3.30 cm Ao Asc diam:  3.20 cm MITRAL VALVE MV Area (PHT): 2.73 cm    SHUNTS MV Decel Time: 278 msec    Systemic VTI:  0.19 m MV E velocity: 51.40 cm/s  Systemic Diam: 2.20 cm MV A velocity: 50.70 cm/s MV E/A ratio:  1.01 Rudean Haskell MD Electronically signed by Rudean Haskell MD Signature Date/Time: 07/21/2021/1:59:19 PM    Final     Microbiology: Recent Results (from  the past 240 hour(s))  Resp Panel by RT-PCR (Flu A&B, Covid) Nasopharyngeal Swab     Status: None   Collection Time: 07/20/21 10:20 PM   Specimen: Nasopharyngeal Swab; Nasopharyngeal(NP) swabs in vial transport medium  Result Value Ref Range Status   SARS Coronavirus 2 by RT PCR NEGATIVE NEGATIVE Final    Comment: (NOTE) SARS-CoV-2 target nucleic acids are NOT DETECTED.  The SARS-CoV-2 RNA is generally detectable in upper respiratory specimens during the acute phase of infection. The lowest concentration of SARS-CoV-2 viral copies this assay can detect is 138 copies/mL. A negative result does not preclude SARS-Cov-2 infection and should not be used as the sole basis for treatment or other patient management decisions. A negative result may occur with  improper specimen collection/handling, submission of specimen other than nasopharyngeal swab, presence of viral mutation(s) within the areas targeted by this assay, and inadequate number of viral copies(<138 copies/mL). A negative result must be combined with clinical observations, patient history, and epidemiological information. The expected result is Negative.  Fact Sheet for Patients:  EntrepreneurPulse.com.au  Fact Sheet for Healthcare Providers:  IncredibleEmployment.be  This test is no t yet approved or cleared by the [place] FDA and  has been authorized for detection and/or diagnosis of SARS-CoV-2 by FDA under an Emergency Use Authorization (EUA). This EUA will remain  in effect (meaning this test can be used) for the duration of the COVID-19 declaration under Section 564(b)(1) of the Act, 21 U.S.C.section 360bbb-3(b)(1), unless the authorization is terminated  or revoked sooner.       Influenza A by PCR NEGATIVE NEGATIVE Final   Influenza B by PCR NEGATIVE NEGATIVE Final    Comment: (NOTE) The Xpert Xpress SARS-CoV-2/FLU/RSV plus assay is intended as an aid in the diagnosis of  influenza from Nasopharyngeal swab specimens and should not be used as a sole basis for treatment. Nasal washings and aspirates are unacceptable for Xpert Xpress SARS-CoV-2/FLU/RSV testing.  Fact Sheet for Patients: EntrepreneurPulse.com.au  Fact Sheet for Healthcare Providers: IncredibleEmployment.be  This test is not yet approved or cleared by the [place] FDA and has been authorized for detection and/or diagnosis of SARS-CoV-2 by FDA under an Emergency Use Authorization (EUA). This EUA will remain in effect (meaning this test can be used) for the  duration of the COVID-19 declaration under Section 564(b)(1) of the Act, 21 U.S.C. section 360bbb-3(b)(1), unless the authorization is terminated or revoked.  Performed at [facility], [place] [address] [place], [place] [address]      Labs: Basic Metabolic Panel: Recent Labs  Lab 07/20/21 1119 07/20/21 2020 07/21/21 0110 07/21/21 0520 07/22/21 0330  NA 135 136  --  134* 137  K 5.7* 6.4* 5.8* 6.1* 5.5*  CL 115* 117*  --  118* 116*  CO2 12* 12*  --  10* 14*  GLUCOSE 63* 114*  --  67* 87  BUN 36* 36*  --  40* 40*  CREATININE 4.14* 4.13*  --  4.03* 3.85*  CALCIUM 9.6 9.3  --  9.4 9.3  MG  --   --   --   --  1.8  PHOS  --   --   --   --  4.7*   Liver Function Tests: Recent Labs  Lab 07/20/21 1119 07/20/21 2020 07/22/21 0330  AST 33 33 32  ALT '23 25 24  '$ ALKPHOS 105 88 90  BILITOT 0.3 0.1* 0.3  PROT 6.6 6.1* 6.2*  ALBUMIN 3.7 2.8* 3.0*   No results for input(s): LIPASE, AMYLASE in the last 168 hours. No results for input(s): AMMONIA in the last 168 hours. CBC: Recent Labs  Lab 07/20/21 1119 07/20/21 2020 07/21/21 0110 07/21/21 0520 07/21/21 1526 07/22/21 0330  WBC 4.3 3.7*  --  4.7 4.1 4.7  NEUTROABS 3.0 2.6  --   --   --   --   HGB 8.0 Repeated and verified X2.* 7.2* 6.8* 7.7* 8.9*   9.0* 10.2*  HCT 24.0* 21.9* 20.6* 23.8* 27.5*   27.9* 30.0*  MCV  93.0 92.4  --  93.0 93.9 91.2  PLT 219.0 213  --  234 191 217   Cardiac Enzymes: No results for input(s): CKTOTAL, CKMB, CKMBINDEX, TROPONINI in the last 168 hours. BNP: BNP (last 3 results) No results for input(s): BNP in the last 8760 hours.  ProBNP (last 3 results) Recent Labs    07/20/21 1119  PROBNP 119.0*    CBG: Recent Labs  Lab 07/21/21 0635  GLUCAP 112*       Signed:  Kayleen Memos, MD [facility] 07/22/2021, 5:28 PM

## 2021-07-22 NOTE — Progress Notes (Signed)
PT Cancellation Note (Screen) ? ?Patient Details ?Name: Antonio Adams ?MRN: [id number] ?DOB: 1945/10/22 ? ? ?Cancelled Treatment:    Reason Eval/Treat Not Completed: PT screened, no needs identified, will sign off Per OT evaluation and discussion pt has no skilled acute PT needs. He is mobilizing at independent level in hospital room. Will sign off at this time. ? ?Verner Mould, DPT ?Acute Rehabilitation Services ?Office [phone] ?Pager [phone]  ? ? ?

## 2021-07-22 NOTE — Progress Notes (Signed)
Initial Nutrition Assessment ? ?DOCUMENTATION CODES:  ? ?Non-severe (moderate) malnutrition in context of chronic illness ? ?INTERVENTION:  ?- will decrease Nepro Shake from BID to once/day, each supplement provides 425 kcal and 19 grams protein. ?- will order 30 ml Prosource Plus BID, each supplement provides 100 kcal and 15 grams protein.  ?- will order 1 tablet rena-vit/day. ?- placed Low Sodium Nutrition Therapy handout from the [employer] in AVS.  ? ? ?NUTRITION DIAGNOSIS:  ? ?Moderate Malnutrition related to chronic illness, cancer and cancer related treatments, other (see comment) (stage 5 CKD) as evidenced by mild fat depletion, mild muscle depletion.  ? ?GOAL:  ? ?Patient will meet greater than or equal to 90% of their needs ? ?MONITOR:  ? ?PO intake, Supplement acceptance, Labs, Weight trends, I & O's ? ?REASON FOR ASSESSMENT:  ? ?Malnutrition Screening Tool ? ?ASSESSMENT:  ? ?76 y.o. male with medical history of stage 5 CKD, diet-controlled DM (HgbA1c: <6% in 05/2021), OA, BPH, hx of prostate cancer and renal cell carcinoma, HLD, hemorrhoids, HTN, GERD, glaucoma, depression, anxiety, and PTSD.  Patient presented to the ED due to worsening anemia, kidney function, and hyperkalemia after having labs done at PCP. His wife reports he has been experiencing increased shortness of breath with exertion for several weeks. ? ?Patient sitting by window sill and wife at bedside. Patient was hospitalized in January. Since that time, appetite and PO intakes have slowly improved. He takes megace once/day at home and has found this to be beneficial.  ? ?At home he monitors potassium, sodium, and sugar intake but sodium restriction is difficult for him so we talked more in detail about this today.  ? ?All questions and concerns addressed.  ? ?Weight today is 178 lb and weight on 06/03/21 was 184 lb. This indicates 6 lb weight loss (3.3% body weight) in the past 6 weeks; not significant for time  frame.  ? ?Patient reports that he has gained some weight since admission in January. He does not weigh himself at home. It is unclear if weight gain is d/t fluid.  ? ?He reports that edema around ankles/BLE is more than usual.  ? ?He denies overt weakness or lethargy. ? ? ?Labs reviewed; K: 5.5 mmol/l, Cl: 116 mmol/l, BUN: 40 mg/dl, creatinine: 3.85 mg/dl, Phos: 4.7 mg/dl, GFR: 16 ml/min.  ? ?Medications reviewed; 40 mg oral lasix/day, 400 mg megace/day, 1300 mg oral sodium bicarb TID, 10 g lokelma x3 doses 3/9, 10 mg lokelma every M/W/F starting 3/10. ? ?IVF; 150 mEq IV sodium bicarb in sterile water @ 50 ml/hr.  ?  ? ?NUTRITION - FOCUSED PHYSICAL EXAM: ? ?Flowsheet Row Most Recent Value  ?Orbital Region No depletion  ?Upper Arm Region Mild depletion  ?Thoracic and Lumbar Region Unable to assess  ?Buccal Region Mild depletion  ?Temple Region Mild depletion  ?Clavicle Bone Region Moderate depletion  ?Clavicle and Acromion Bone Region Mild depletion  ?Scapular Bone Region No depletion  ?Dorsal Hand No depletion  ?Patellar Region Mild depletion  ?Anterior Thigh Region Mild depletion  ?Posterior Calf Region Unable to assess  [may be masked by edema]  ?Edema (RD Assessment) Mild  [BLE, mainly around ankle area]  ?Hair Reviewed  ?Eyes Reviewed  ?Mouth Reviewed  ?Skin Reviewed  ?Nails Reviewed  ? ?  ? ? ?Diet Order:   ?Diet Order   ? ?       ?  Diet renal with fluid restriction Fluid restriction: 1200 mL Fluid; Room  service appropriate? Yes; Fluid consistency: Thin  Diet effective now       ?  ? ?  ?  ? ?  ? ? ?EDUCATION NEEDS:  ? ?Education needs have been addressed ? ?Skin:  Skin Assessment: Reviewed RN Assessment ? ?Last BM:  PTA/unknown ? ?Height:  ? ?Ht Readings from Last 1 Encounters:  ?07/21/21 '5\' 11"'$  (1.803 m)  ? ? ?Weight:  ? ?Wt Readings from Last 1 Encounters:  ?07/22/21 80.7 kg  ? ? ? ?BMI:  Body mass index is 24.81 kg/m?. ? ?Estimated Nutritional Needs:  ?Kcal:  2000-2200 kcal ?Protein:  80-95 grams ?Fluid:   >/= 1.5 L/day ? ? ? ? ?Jarome Matin, MS, RD, LDN ?Inpatient Clinical Dietitian ?RD pager # available in [place]  ?After hours/weekend pager # available in [place] ? ?

## 2021-07-22 NOTE — TOC Progression Note (Addendum)
Transition of Care (TOC) - Progression Note  ? ? ?Patient Details  ?Name: RALPHEAL ZAPPONE ?MRN: [id number] ?Date of Birth: 02/16/1946 ? ?Transition of Care (TOC) CM/SW Contact  ?Purcell Mouton, RN ?Phone Number: ?07/22/2021, 9:14 AM ? ?Clinical Narrative:    ? ? ?Transition of Care (TOC) Screening Note ? ? ?Patient Details  ?Name: LIONELL MATUSZAK ?Date of Birth: November 28, 1945 ? ? ?Transition of Care (TOC) CM/SW Contact:    ?Purcell Mouton, RN ?Phone Number: ?07/22/2021, 9:15 AM ? ? ? ?Transition of Care Department [facility]) has reviewed patient and no TOC needs have been identified at this time. We will continue to monitor patient advancement through interdisciplinary progression rounds. If new patient transition needs arise, please place a TOC consult. ?  ? ?  ?  ? ?Expected Discharge Plan and Services ?  ?  ?  ?  ?  ?                ?  ?  ?  ?  ?  ?  ?  ?  ?  ?  ? ? ?Social Determinants of Health (SDOH) Interventions ?  ? ?Readmission Risk Interventions ?No flowsheet data found. ? ?

## 2021-07-22 NOTE — Evaluation (Signed)
Occupational Therapy Evaluation ?Patient Details ?Name: Antonio Adams ?MRN: [id number] ?DOB: 04/17/1946 ?Today's Date: 07/22/2021 ? ? ?History of Present Illness Antonio Adams is a 76 y.o. male with medical history significant of CKD 5, Diet controlled DM, OA, BPH, hx of prostate cancer and renal cell carcinoma who presents worsening anemia, kidney function, and hyperkalemia after having labs at PCP earlier today. Admittered for hyperkalemia.  ? ?Clinical Impression ?  ?Antonio Adams is a 76 year old man who presents with above medical history. On evaluation he demonstrates functional strength and independence with ambulation and ADLs. He reports his breathing is better and no significant dyspnea during evaluation. O2 sat 98% after ambulating approx 80 feet. Patient has no OT needs at this time.  ?   ? ?Recommendations for follow up therapy are one component of a multi-disciplinary discharge planning process, led by the attending physician.  Recommendations may be updated based on patient status, additional functional criteria and insurance authorization.  ? ?Follow Up Recommendations ? No OT follow up  ?  ?Assistance Recommended at Discharge None  ?Patient can return home with the following   ? ?  ?Functional Status Assessment ? Patient has not had a recent decline in their functional status  ?Equipment Recommendations ? None recommended by OT  ?  ?Recommendations for Other Services   ? ? ?  ?Precautions / Restrictions Precautions ?Precautions: None  ? ?  ? ?Mobility Bed Mobility ?Overal bed mobility: Independent ?  ?  ?  ?  ?  ?  ?  ?  ? ?Transfers ?Overall transfer level: Independent ?  ?  ?  ?  ?  ?  ?  ?  ?  ?  ? ?  ?Balance Overall balance assessment: No apparent balance deficits (not formally assessed) ?  ?  ?  ?  ?  ?  ?  ?  ?  ?  ?  ?  ?  ?  ?  ?  ?  ?  ?   ? ?ADL either performed or assessed with clinical judgement  ? ?ADL Overall ADL's : Independent ?  ?  ?  ?  ?  ?  ?  ?  ?  ?  ?  ?  ?   ?  ?  ?  ?  ?  ?  ?   ? ? ? ?Vision Patient Visual Report: No change from baseline ?   ?   ?Perception   ?  ?Praxis   ?  ? ?Pertinent Vitals/Pain Pain Assessment ?Pain Assessment: No/denies pain  ? ? ? ?Hand Dominance Right ?  ?Extremity/Trunk Assessment Upper Extremity Assessment ?Upper Extremity Assessment: Overall WFL for tasks assessed ?  ?Lower Extremity Assessment ?Lower Extremity Assessment: Overall WFL for tasks assessed ?  ?Cervical / Trunk Assessment ?Cervical / Trunk Assessment: Normal ?  ?Communication Communication ?Communication: No difficulties ?  ?Cognition Arousal/Alertness: Awake/alert ?Behavior During Therapy: [facility] for tasks assessed/performed ?Overall Cognitive Status: Within Functional Limits for tasks assessed ?  ?  ?  ?  ?  ?  ?  ?  ?  ?  ?  ?  ?  ?  ?  ?  ?  ?  ?  ?General Comments    ? ?  ?Exercises   ?  ?Shoulder Instructions    ? ? ?Home Living Family/patient expects to be discharged to:: Private residence ?Living Arrangements: Spouse/significant other ?Available Help at Discharge: Family;Available PRN/intermittently ?Type of Home: House ?Home Access:  Stairs to enter ?Entrance Stairs-Number of Steps: 4 ?Entrance Stairs-Rails: Right ?Home Layout: Able to live on main level with bedroom/bathroom ?  ?  ?Bathroom Shower/Tub: Walk-in shower ?  ?Bathroom Toilet: Standard ?Bathroom Accessibility: Yes ?  ?Home Equipment: Conservation officer, nature (2 wheels);Cane - single point ?  ?  ?  ? ?  ?Prior Functioning/Environment Prior Level of Function : Independent/Modified Independent ?  ?  ?  ?  ?  ?  ?Mobility Comments: cane PRN ?ADLs Comments: Ind at baseline ?  ? ?  ?  ?OT Problem List:   ?  ?   ?OT Treatment/Interventions:    ?  ?OT Goals(Current goals can be found in the care plan section) Acute Rehab OT Goals ?OT Goal Formulation: All assessment and education complete, DC therapy  ?OT Frequency:   ?  ? ?Co-evaluation   ?  ?  ?  ?  ? ?  ?AM-PAC OT "6 Clicks" Daily Activity     ?Outcome Measure Help from  another person eating meals?: None ?Help from another person taking care of personal grooming?: None ?Help from another person toileting, which includes using toliet, bedpan, or urinal?: None ?Help from another person bathing (including washing, rinsing, drying)?: None ?Help from another person to put on and taking off regular upper body clothing?: None ?Help from another person to put on and taking off regular lower body clothing?: None ?6 Click Score: 24 ?  ?End of Session   ? ?Activity Tolerance: Patient tolerated treatment well ?Patient left: in bed ? ?OT Visit Diagnosis: Muscle weakness (generalized) (M62.81)  ?              ?Time: 3212-2482 ?OT Time Calculation (min): 8 min ?Charges:  OT General Charges ?$OT Visit: 1 Visit ?OT Evaluation ?$OT Eval Low Complexity: 1 Low ? ?Antonio Adams, OTR/L ?Acute Care Rehab Services  ?Office [phone] ?Pager: [phone]  ? ?Antonio Adams ?07/22/2021, 9:30 AM ?

## 2021-07-22 NOTE — Consult Note (Signed)
[facility] CM Inpatient Consult ? ? ?07/22/2021 ? ?Antonio Adams ?1945-06-02 ?[id number] ? ?[place] [facility] CM) ?  ?Patient chart reviewed with noted high risk score for unplanned readmission. Patient currently active with chronic care management team embedded at primary provider office. PCP office listed for post hospital transition of care calls.  ? ?Plan: Will update community care coordinator of patient progression and disposition. ? ?Of note, [facility] Care Management services does not replace or interfere with any services that are arranged by inpatient case management or social work.  ? ?Netta Cedars, MSN, RN ?[facility] Liaison ?Mobile Phone [phone]  ?Toll free office [phone]  ? ?

## 2021-07-22 NOTE — Consult Note (Addendum)
Cardiology Consultation:   Patient ID: Antonio Adams MRN: [id number]; DOB: 1945-06-15  Admit date: 07/20/2021 Date of Consult: 07/22/2021  PCP:  [name], MD   [facility] Providers Cardiologist:  New   Patient Profile:   Antonio Adams is a 76 y.o. male with a hx of HTN, pre-DM, CKD stage V 2nd to renal cell carcinoma s/p right partial nephrectomy in 2017 s/p right adrenalectomy and metastatic resection of a recurrent kidney tumor right retroperitoneum in 2021 with recurrence with liver  & adrenal metastasis s/p ablation in 2022 then started on chemo, chronic anemia due to CKD, prostate cancer and duodenal carcinoid s/p resection who is being seen 07/22/2021 for the evaluation of CHF/LV dysfunction at the request of Dr. Nevada Crane.  History of Present Illness:   Antonio Adams sent to hospital by PCP due to worsening anemia, renal function and hyperkalemia.  Prior history of transfusion in January secondary to anemia.  Patient is dealing with exertional shortness of breath for past few weeks without chest tightness or pressure.  No regular exercise but remains active.  Reported lower extremity edema without orthopnea or PND.  He denies using excess salt intake.  He denies melena or blood in the stool or urine.   Unfortunately patient has stopped taking his Lokelma due to LE edema. On arrival Hemoglobin was 7.2 with repeat 6.8; Potassium was 6.4 And Creatinine was 4.13.  Patient was treated with Lokelma, Lasix, D50 and insulin.  Treated with 2 units of PRBCs today.  FOBT negative.  He was given bicarb drip secondary to metabolic acidosis.  Pro BNP 119  Cardiogram showed low normal LV function at 50%.  Cardiology is asked for further evaluation.  Patient denies prior cardiac history.  Chest x-ray on admission without acute cardiopulmonary disease.  Patient does not want any invasive evaluation or being on dialysis.   Echo 07/21/2021  1. Left ventricular ejection fraction, by  estimation, is 50%. Left  ventricular ejection fraction by 3D volume is 48 %. The left ventricle has  mildly decreased function. The left ventricle demonstrates global  hypokinesis. Left ventricular diastolic  parameters are consistent with Grade I diastolic dysfunction (impaired  relaxation).   2. Right ventricular systolic function is normal. The right ventricular  size is normal.   3. The mitral valve is normal in structure. No evidence of mitral valve  regurgitation. No evidence of mitral stenosis.   4. The aortic valve is tricuspid. Aortic valve regurgitation is not  visualized. Aortic valve sclerosis is present, with no evidence of aortic  valve stenosis.   5. The inferior vena cava is normal in size with greater than 50%  respiratory variability, suggesting right atrial pressure of 3 mmHg.   Comparison(s): No prior Echocardiogram.    Past Medical History:  Diagnosis Date   ADENOCARCINOMA, PROSTATE, GLEASON GRADE 6 12/21/2009   prostate cancer   ALLERGIC RHINITIS 03/16/2007   Allergy    Anxiety    Arthritis    back    BACK PAIN WITH RADICULOPATHY 11/23/2008   Cancer of kidney ([place])    partial right kidney removed   CARPAL TUNNEL SYNDROME, BILATERAL 03/16/2007   Chronic back pain    Constipation    takes Senokot daily   Depression    occasionally   DIABETES MELLITUS, TYPE II 03/16/2007   takes Januvia and MEtformin daily   GASTROINTESTINAL HEMORRHAGE, HX OF 03/16/2007   GERD 03/16/2007   takes Omeprazole daily   Glaucoma  mild - no eye drops   Hemorrhoids    History of colon polyps    HYPERLIPIDEMIA 03/16/2007   takes Crestor daily   HYPERTENSION 03/16/2007   takes Diltiazem and Lisinopril daily    Kidney cancer, primary, with metastasis from kidney to other site [facility])    LEG CRAMPS 05/22/2007   Nocturia    Overactive bladder    PARESTHESIA 05/21/2007   Proctitis    PTSD (post-traumatic stress disorder)    wakes up may be dreaming of fighting   Rectal bleeding     Dr.Norins has explained its from the Radiation that he has received   Rectal bleeding    Renal cell carcinoma ([place])    Sleep apnea    uses CPAP nightly   Stomach cancer ([place])    Urinary frequency    takes Toviaz daily    Past Surgical History:  Procedure Laterality Date   ADRENALECTOMY Right 02/26/2020   Procedure: OPEN RIGHT RENAL CELL METASTATECTOMY AND  ADRENALECTOMY;  Surgeon: [name], MD;  Location: [facility];  Service: Urology;  Laterality: Right;   [place], 2008   repeat surgery; ESI '08   [place]     bilateral   CERVICAL FUSION     COLONOSCOPY     colonosocpy     ESOPHAGOGASTRODUODENOSCOPY     ED   FLEXIBLE SIGMOIDOSCOPY N/A 01/08/2017   Procedure: FLEXIBLE SIGMOIDOSCOPY;  Surgeon: Irene Shipper, MD;  Location: [place];  Service: Endoscopy;  Laterality: N/A;   HOT HEMOSTASIS N/A 01/08/2017   Procedure: HOT HEMOSTASIS (ARGON PLASMA COAGULATION/BICAP);  Surgeon: Irene Shipper, MD;  [facility];  Service: Endoscopy;  Laterality: N/A;   IR RADIOLOGIST EVAL & MGMT  12/17/2019   IR RADIOLOGIST EVAL & MGMT  07/01/2020   IR RADIOLOGIST EVAL & MGMT  08/25/2020   LUMBAR LAMINECTOMY  10/30/2011   Procedure: MICRODISCECTOMY LUMBAR LAMINECTOMY;  Surgeon: Jessy Oto, MD;  Location: [place];  Service: Orthopedics;  Laterality: Right;  Right L4-5 and L5-S1 Microdiscectomy   LUMBAR LAMINECTOMY  06/17/2012   Procedure: MICRODISCECTOMY LUMBAR LAMINECTOMY;  Surgeon: Jessy Oto, MD;  Location: [place];  Service: Orthopedics;  Laterality: N/A;  Right L5-S1 microdiscectomy   OPEN PARTIAL HEPATECTOMY  N/A 02/26/2020   Procedure: OPEN PARTIAL HEPATECTOMY, CHOLECYSTECTOMY;  Surgeon: Stark Klein, MD;  Location: [facility];  Service: General;  Laterality: N/A;   POLYPECTOMY     PROSTATE SURGERY  march 2012   seed implant   RADIOFREQUENCY ABLATION N/A 07/21/2020   Procedure: CT MICROWAVE ABLATION;  Surgeon: Criselda Peaches, MD;  Location: [facility];   Service: Anesthesiology;  Laterality: N/A;   ROBOTIC ASSITED PARTIAL NEPHRECTOMY Right 09/08/2015   Procedure: XI ROBOTIC ASSITED RIGHT PARTIAL NEPHRECTOMY;  Surgeon: [name], MD;  Location: [facility];  Service: Urology;  Laterality: Right;  Clamp on: 1033 Clamp off: 1103 Total Clamp Time: 30 minutes   SHOULDER ARTHROSCOPY WITH SUBACROMIAL DECOMPRESSION Left 01/20/2016   Procedure: LEFT SHOULDER ARTHROSCOPY WITH EXTENSIVE  DEBRIDEMENT, ACROMIOPLASTY;  Surgeon: Ninetta Lights, MD;  Location: [place];  Service: Orthopedics;  Laterality: Left;   SHOULDER SURGERY  LFT   Stress Cardiolite  12/24/2003   negative for ischemia   UPPER ESOPHAGEAL ENDOSCOPIC ULTRASOUND (EUS)  04/18/2016   [facility]   UPPER GASTROINTESTINAL ENDOSCOPY     WOUND EXPLORATION     management of a wound that he sustained in the [employer]  Inpatient Medications: Scheduled Meds:  sodium chloride   Intravenous Once   sodium chloride   Intravenous Once   acetaminophen  650 mg Oral Once   atorvastatin  20 mg Oral QHS   feeding supplement (NEPRO CARB STEADY)  237 mL Oral BID BM   furosemide  40 mg Oral Daily   megestrol  400 mg Oral Daily   sodium bicarbonate  1,300 mg Oral TID   sodium chloride flush  3 mL Intravenous Q12H   tamsulosin  0.4 mg Oral Daily   Continuous Infusions:  sodium chloride Stopped (07/21/21 1018)    sodium bicarbonate (isotonic) infusion in sterile water 50 mL/hr at 07/22/21 0612   PRN Meds: sodium chloride, acetaminophen **OR** acetaminophen, ondansetron **OR** ondansetron (ZOFRAN) IV, senna-docusate, sodium chloride flush  Allergies:    Allergies  Allergen Reactions   Ace Inhibitors Swelling    lisinopril   Xyzal [Levocetirizine] Other (See Comments)    Break out   Opdivo [Nivolumab] Rash    Social History:   Social History   Socioeconomic History   Marital status: Widowed    Spouse name: Deneise Lever   Number of children: 3   Years of education: 12+    Highest education level: Not on file  Occupational History   Occupation: Event organiser; Banker: RETIRED    Comment: retired from Event organiser  Tobacco Use   Smoking status: Never   Smokeless tobacco: Never  Vaping Use   Vaping Use: Never used  Substance and Sexual Activity   Alcohol use: Not Currently    Alcohol/week: 0.0 standard drinks   Drug use: No   Sexual activity: Not Currently    Partners: Female  Other Topics Concern   Not on file  Social History Narrative   Married-divorced '89, remarried '96. 2 sons, 1 daughter. 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 He wishes to be a full resuscitation candidate.       Social Determinants of Health   Financial Resource Strain: Low Risk    Difficulty of Paying Living Expenses: Not hard at all  Food Insecurity: No Food Insecurity   Worried About Charity fundraiser in the Last Year: Never true   [place] in the Last Year: Never true  Transportation Needs: No Transportation Needs   Lack of Transportation (Medical): No   Lack of Transportation (Non-Medical): No  Physical Activity: Inactive   Days of Exercise per Week: 0 days   Minutes of Exercise per Session: 0 min  Stress: No Stress Concern Present   Feeling of Stress : Not at all  Social Connections: Moderately Integrated   Frequency of Communication with Friends and Family: More than three times a week   Frequency of Social Gatherings with Friends and Family: Once a week   Attends Religious Services: More than 4 times per year   Active Member of [employer] or Organizations: No   Attends Music therapist: More than 4 times per year   Marital Status: Divorced  Human resources officer Violence: Not At Risk   Fear of Current or Ex-Partner: No   Emotionally Abused: No   Physically Abused: No   Sexually Abused: No    Family History:   Family History  Problem Relation Age of Onset   Cancer  Sister 25       Cancer, unsure type   Hypertension Other    Colon cancer Neg Hx  Esophageal cancer Neg Hx    Stomach cancer Neg Hx    Diabetes Neg Hx    Colon polyps Neg Hx    Rectal cancer Neg Hx      ROS:  Please see the history of present illness.  All other ROS reviewed and negative.     Physical Exam/Data:   Vitals:   07/21/21 1645 07/21/21 2121 07/22/21 0142 07/22/21 0408  BP: (!) 143/96 (!) 158/85 (!) 141/86 133/83  Pulse: 71 67 74 80  Resp: '16 20 20 20  '$ Temp: 98.7 F (37.1 C) 98.8 F (37.1 C) 98.7 F (37.1 C) 98.7 F (37.1 C)  TempSrc: Oral Oral Oral Oral  SpO2: 100% 100% 100% 100%  Weight:    80.7 kg  Height:        Intake/Output Summary (Last 24 hours) at 07/22/2021 0744 Last data filed at 07/22/2021 0612 Gross per 24 hour  Intake 1698.43 ml  Output --  Net 1698.43 ml   Last 3 Weights 07/22/2021 07/21/2021 07/20/2021  Weight (lbs) 177 lb 14.6 oz 186 lb 1.1 oz 186 lb  Weight (kg) 80.7 kg 84.4 kg 84.369 kg     Body mass index is 24.81 kg/m.  General:  Well nourished, well developed, in no acute distress HEENT: normal Neck: no JVD Vascular: No carotid bruits; Distal pulses 2+ bilaterally Cardiac:  normal S1, S2; RRR; no murmur  Lungs:  clear to auscultation bilaterally, no wheezing, rhonchi or rales  Abd: soft, nontender, no hepatomegaly  Ext: 1 + BL LE edema Musculoskeletal:  No deformities, BUE and BLE strength normal and equal Skin: warm and dry  Neuro:  CNs 2-12 intact, no focal abnormalities noted Psych:  Normal affect   EKG:  The EKG was personally reviewed and demonstrates:  Sinus rhythm Telemetry:  Telemetry was personally reviewed and demonstrates:  Sinus rhythm, intermittent sinus tachycardia in 130s  Relevant CV Studies: As above   Laboratory Data:  Chemistry Recent Labs  Lab 07/20/21 2020 07/21/21 0110 07/21/21 0520 07/22/21 0330  NA 136  --  134* 137  K 6.4* 5.8* 6.1* 5.5*  CL 117*  --  118* 116*  CO2 12*  --  10* 14*   GLUCOSE 114*  --  67* 87  BUN 36*  --  40* 40*  CREATININE 4.13*  --  4.03* 3.85*  CALCIUM 9.3  --  9.4 9.3  MG  --   --   --  1.8  GFRNONAA 14*  --  15* 16*  ANIONGAP 7  --  6 7    Recent Labs  Lab 07/20/21 1119 07/20/21 2020 07/22/21 0330  PROT 6.6 6.1* 6.2*  ALBUMIN 3.7 2.8* 3.0*  AST 33 33 32  ALT '23 25 24  '$ ALKPHOS 105 88 90  BILITOT 0.3 0.1* 0.3   Lipids  Recent Labs  Lab 07/22/21 0330  CHOL 136  TRIG 46  HDL 34*  LDLCALC 93  CHOLHDL 4.0    Hematology Recent Labs  Lab 07/21/21 0520 07/21/21 1526 07/22/21 0330  WBC 4.7 4.1 4.7  RBC 2.56* 2.93* 3.29*  HGB 7.7* 8.9*   9.0* 10.2*  HCT 23.8* 27.5*   27.9* 30.0*  MCV 93.0 93.9 91.2  MCH 30.1 30.4 31.0  MCHC 32.4 32.4 34.0  RDW 17.4* 17.0* 16.6*  PLT 234 191 217   BNP Recent Labs  Lab 07/20/21 1119  PROBNP 119.0*     Radiology/Studies:  DG Chest 2 View  Result Date: 07/20/2021 CLINICAL DATA:  Shortness of breath.  Abnormal kidney function. EXAM: CHEST - 2 VIEW COMPARISON:  Radiograph earlier today. FINDINGS: Lower lung volumes from exam earlier today. Stable heart size and mediastinal contours allowing for differences in technique. Minor basilar atelectasis without confluent consolidation. No pneumothorax, pleural effusion, or pulmonary edema. No acute osseous findings. IMPRESSION: Lower lung volumes from earlier today with mild basilar atelectasis. Electronically Signed   By: Keith Rake M.D.   On: 07/20/2021 20:39   DG Chest 2 View  Result Date: 07/20/2021 CLINICAL DATA:  Dyspnea on exertion for 2 months EXAM: CHEST - 2 VIEW COMPARISON:  Chest radiograph 05/23/2021 FINDINGS: The heart is at the upper limits of normal for size. The mediastinal contours are within normal limits. There is no focal consolidation or pulmonary edema. There is no pleural effusion or pneumothorax. There is no acute osseous abnormality. IMPRESSION: No radiographic evidence of acute cardiopulmonary process. Electronically Signed    By: Valetta Mole M.D.   On: 07/20/2021 11:34   ECHOCARDIOGRAM COMPLETE  Result Date: 07/21/2021    ECHOCARDIOGRAM REPORT   Patient Name:   TYCHO CHERAMIE Date of Exam: 07/21/2021 Medical Rec #:  [id number]           Height:       71.0 in Accession #:    [id number]          Weight:       186.1 lb Date of Birth:  04/09/1946          BSA:          2.045 m Patient Age:    53 years            BP:           148/86 mmHg Patient Gender: M                   HR:           72 bpm. Exam Location:  Inpatient Procedure: 2D Echo, 3D Echo, Cardiac Doppler and Color Doppler Indications:    CHF-Acute Diastolic C78.93  History:        Patient has no prior history of Echocardiogram examinations.                 Risk Factors:Sleep Apnea, Hypertension, Dyslipidemia and                 Diabetes.  Sonographer:    Bernadene Person RDCS Referring Phys: [id number] [place]  1. Left ventricular ejection fraction, by estimation, is 50%. Left ventricular ejection fraction by 3D volume is 48 %. The left ventricle has mildly decreased function. The left ventricle demonstrates global hypokinesis. Left ventricular diastolic parameters are consistent with Grade I diastolic dysfunction (impaired relaxation).  2. Right ventricular systolic function is normal. The right ventricular size is normal.  3. The mitral valve is normal in structure. No evidence of mitral valve regurgitation. No evidence of mitral stenosis.  4. The aortic valve is tricuspid. Aortic valve regurgitation is not visualized. Aortic valve sclerosis is present, with no evidence of aortic valve stenosis.  5. The inferior vena cava is normal in size with greater than 50% respiratory variability, suggesting right atrial pressure of 3 mmHg. Comparison(s): No prior Echocardiogram. FINDINGS  Left Ventricle: Left ventricular ejection fraction, by estimation, is 50%. Left ventricular ejection fraction by 3D volume is 48 %. The left ventricle has mildly decreased function.  The left ventricle demonstrates global hypokinesis. The left ventricular internal cavity  size was normal in size. There is no left ventricular hypertrophy. Left ventricular diastolic parameters are consistent with Grade I diastolic dysfunction (impaired relaxation). Right Ventricle: The right ventricular size is normal. No increase in right ventricular wall thickness. Right ventricular systolic function is normal. Left Atrium: Left atrial size was normal in size. Right Atrium: Right atrial size was normal in size. Pericardium: There is no evidence of pericardial effusion. Mitral Valve: The mitral valve is normal in structure. No evidence of mitral valve regurgitation. No evidence of mitral valve stenosis. Tricuspid Valve: The tricuspid valve is normal in structure. Tricuspid valve regurgitation is not demonstrated. Aortic Valve: The aortic valve is tricuspid. Aortic valve regurgitation is not visualized. Aortic valve sclerosis is present, with no evidence of aortic valve stenosis. Pulmonic Valve: The pulmonic valve was not well visualized. Pulmonic valve regurgitation is not visualized. Aorta: The aortic root and ascending aorta are structurally normal, with no evidence of dilitation. Venous: The inferior vena cava is normal in size with greater than 50% respiratory variability, suggesting right atrial pressure of 3 mmHg. IAS/Shunts: No atrial level shunt detected by color flow Doppler.  LEFT VENTRICLE PLAX 2D LVIDd:         4.80 cm         Diastology LVIDs:         3.50 cm         LV e' medial:    4.95 cm/s LV PW:         1.00 cm         LV E/e' medial:  10.4 LV IVS:        1.20 cm         LV e' lateral:   6.38 cm/s LVOT diam:     2.20 cm         LV E/e' lateral: 8.1 LV SV:         73 LV SV Index:   36 LVOT Area:     3.80 cm        3D Volume EF                                LV 3D EF:    Left                                             ventricul LV Volumes (MOD)                            ar LV vol d, MOD    127.0  ml                   ejection A2C:                                        fraction LV vol d, MOD    129.0 ml                   by 3D A4C:  volume is LV vol s, MOD    64.2 ml                    48 %. A2C: LV vol s, MOD    61.9 ml A4C:                           3D Volume EF: LV SV MOD A2C:   62.8 ml       3D EF:        48 % LV SV MOD A4C:   129.0 ml      LV EDV:       171 ml LV SV MOD BP:    65.9 ml       LV ESV:       89 ml                                LV SV:        82 ml RIGHT VENTRICLE RV S prime:     10.60 cm/s TAPSE (M-mode): 1.8 cm LEFT ATRIUM           Index        RIGHT ATRIUM           Index LA diam:      3.90 cm 1.91 cm/m   RA Area:     17.20 cm LA Vol (A2C): 35.4 ml 17.31 ml/m  RA Volume:   41.30 ml  20.20 ml/m LA Vol (A4C): 36.0 ml 17.61 ml/m  AORTIC VALVE LVOT Vmax:   95.10 cm/s LVOT Vmean:  61.300 cm/s LVOT VTI:    0.191 m  AORTA Ao Root diam: 3.30 cm Ao Asc diam:  3.20 cm MITRAL VALVE MV Area (PHT): 2.73 cm    SHUNTS MV Decel Time: 278 msec    Systemic VTI:  0.19 m MV E velocity: 51.40 cm/s  Systemic Diam: 2.20 cm MV A velocity: 50.70 cm/s MV E/A ratio:  1.01 Rudean Haskell MD Electronically signed by Rudean Haskell MD Signature Date/Time: 07/21/2021/1:59:19 PM    Final      Assessment and Plan:   Low normal LV function Lower extremity edema -Patient reported having lower extremity edema since placed on Lokelma by nephrologist.  He stopped taking leukoma & presented for hyperkalemia and worsening anemia with renal function. These improving with Tx.  -Patient reported exertional dyspnea without chest tightness.  He denies orthopnea and PND as well. -Echocardiogram with low normal LV function at 50% without regional wall motion abnormality.  Normal RV function.  His dyspnea could be multifactorial from deconditioning and anemia.  - Reviewed with pharmacist "So it looks like it can cause edema (reported anywhere from 4-16%). " .  There was  no pulmonary edema on chest x-ray upon arrival.  BNP almost normal.  Patient denies using excess salt in diet.  I think his lower extremity edema due to worsening CKD rather than heart failure.  Recommending leg elevation and compression stocking.  Now on Lasix.  Recommended management by nephrology given history of CKD with renal cell carcinoma.  3.  Tachycardia -Review of telemetry shows sinus rhythm with heart rate relatively in 70s.  Intermittent sinus tachycardia in 130s.  Patient does endorse intermittent palpitation but reports slowing down heart rate with rest.  Questionable causing low normal LV function.  Consider adding low-dose beta-blocker to stabilize heart rate.  This will also helps with intermittent elevated blood pressure.  4. Calcified coronary artery atherosclerosis and/or stents on CT of abdomen on 05/2021 - Continue statin  - Could add ASA if okay on anemia stand point   Patient is very clear that he neither want to be on dialysis nor wants invasive evaluation.  Recommended medical management with addition of beta-blocker.  No further cardiac work-up warranted.  Dr. Harrell Gave to see with final recommendations later today. Continue Lipitor '20mg'$  qd.   5. Hyperkalemia 6. CKD 7. HTN 8. Pre-DM 9. Anemia - Per primary team    For questions or updates, please contact [place] Please consult [email or web address] for contact info under    Jarrett Soho, PA  07/22/2021 7:44 AM

## 2021-07-22 NOTE — Discharge Instructions (Signed)
Low-Sodium Nutrition Therapy Eating less sodium can help you if you have high blood pressure, heart failure, or kidney or liver disease. Your body needs a little sodium, but too much sodium can cause your body to hold onto extra water.  This extra water will raise your blood pressure and can cause damage to your heart, kidneys, or liver as they are forced to work harder. Sometimes you can see how the extra fluid affects you because your hands, legs, or belly swell.  You may also hold water around your heart and lungs, which makes it hard to breathe. Even if you take medication for blood pressure or a water pill (diuretic) to remove fluid, it is still important to have less salt in your diet. Check with your primary care provider before drinking alcohol since it may affect the amount of fluid in your body and how your heart, kidneys, or liver work.  Sodium in Food A low-sodium meal plan limits the sodium that you get from food and beverages to 1,500-2,000 milligrams (mg) per day. Salt is the main source of sodium.  Read the nutrition label on the package to find out how much sodium is in one serving of a food. Select foods with 140 milligrams (mg) of sodium or less per serving. You may be able to eat one or two servings of foods with a little more than 140 milligrams (mg) of sodium if you are closely watching how much sodium you eat in a day. Check the serving size on the label. The amount of sodium listed on the label shows the amount in one serving of the food. So, if you eat more than one serving, you will get more sodium than the amount listed.  Cutting Back on Sodium Eat more fresh foods. Fresh fruits and vegetables are low in sodium, as well as frozen vegetables and fruits that have no added juices or sauces. Fresh meats are lower in sodium than processed meats, such as bacon, sausage, and hotdogs. Not all processed foods are unhealthy, but some processed foods may have too much  sodium. Eat less salt at the table and when cooking.  One of the ingredients in salt is sodium. One teaspoon of table salt has 2,300 milligrams of sodium. Leave the salt out of recipes for pasta, casseroles, and soups. Be a smart shopper. Food packages that say "Salt-free", sodium-free", "very low sodium," and "low sodium" have less than 140 milligrams of sodium per serving. Beware of products identified as "Unsalted," "No Salt Added," "Reduced Sodium," or "Lower Sodium."  These items may still be high in sodium. You should always check the nutrition label. Add flavors to your food without adding sodium. Try lemon juice, lime juice, or vinegar. Dry or fresh herbs add flavor. Buy a sodium-free seasoning blend or make your own at home. You can purchase salt-free or sodium-free condiments like barbeque sauce in stores and online.   Eating in Restaurants Choose foods carefully when you eat outside your home. Restaurant foods can be very high in sodium.  Many restaurants provide nutrition facts on their menus or their websites. If you cannot find that information, ask your server.  Let your server know that you want your food to be cooked without salt and that you would like your salad dressing and sauces to be served on the side.  Foods Recommended Grains Bread and rolls without salted tops Homemade bread made with reduced-sodium baking powder Cold cereals, especially shredded wheat and puffed rice Oats, grits, or   cream of wheat Pastas, quinoa, and rice Popcorn, pretzels or crackers without salt Corn tortillas Protein Foods Fresh meats and fish (check the nutrition labels - make sure they are not packaged in a sodium solution) Canned or packed tuna (no more than 4 ounces at 1 serving) Beans and peas Soybeans and tofu Eggs Nuts or nut butters without salt Dairy Milk or milk powder Plant milks, such as rice and soy Yogurt, including Greek yogurt Small amounts of natural cheese  (blocks of cheese) or reduced-sodium cheese can be used in moderation.  (Swiss, ricotta, and fresh mozzarella cheese are lower in sodium than the others) Cream Cheese Low sodium cottage cheese Vegetables Fresh and frozen vegetables without added sauces or salt Homemade soups (without salt) Low-sodium, salt-free or sodium-free canned vegetables and soups Fruit Fresh and canned fruits Dried fruits, such as raisins, cranberries, and prunes Oils Tub or liquid margarine, regular or without salt Canola, corn, peanut, olive, safflower, or sunflower oils Condiments Fresh or dried herbs such as basil, bay leaf, dill, mustard (dry), nutmeg, paprika, parsley, rosemary, sage, or thyme. Low sodium ketchup Vinegar Lemon or lime juice Pepper, red pepper flakes, and cayenne. Hot sauce contains sodium, but if you use just a drop or two, it will not add up to much. Salt-free or sodium-free seasoning mixes and marinades Simple salad dressings: vinegar and oil  Foods Not Recommended Grains Breads or crackers topped with salt Cereals (hot/cold) with more than 300 mg sodium per serving Biscuits, cornbread, and other "quick" breads prepared with baking soda Pre-packaged bread crumbs Seasoned and packaged rice and pasta mixes Self-rising flours Protein Foods Cured meats: Bacon, ham, sausage, pepperoni and hot dogs Canned meats (chili, vienna sausage, or sardines) Smoked fish and meats Frozen meals that have more than 600 mg of sodium per serving Egg substitute (with added sodium) Dairy Buttermilk Processed cheese spreads Cottage cheese (1 cup may have over 500 mg of sodium; look for low-sodium.) American or feta cheese Shredded cheese has more sodium than blocks of cheese String cheese Vegetables Canned vegetables (unless they are salt-free, sodium-free or low sodium) Frozen vegetables with seasoning and sauces Sauerkraut and pickled vegetables Canned or dried soups (unless they are  salt-free, sodium-free, or low sodium) French fries and onion rings Fruit  Dried fruits preserved with additives that have sodium Oils  Salted butter or margarine, all types of olives Condiments Salt, sea salt, kosher salt, onion salt, and garlic salt Seasoning mixes with salt Bouillon cubes Ketchup Barbeque sauce and Worcestershire sauce unless low sodium Soy sauce Salsa, pickles, olives, relish Salad dressings: ranch, blue cheese, Italian, and French.  

## 2021-07-25 DIAGNOSIS — E1136 Type 2 diabetes mellitus with diabetic cataract: Secondary | ICD-10-CM

## 2021-07-25 DIAGNOSIS — H43819 Vitreous degeneration, unspecified eye: Secondary | ICD-10-CM

## 2021-07-25 DIAGNOSIS — Z86010 Personal history of colonic polyps: Secondary | ICD-10-CM

## 2021-07-25 DIAGNOSIS — L603 Nail dystrophy: Secondary | ICD-10-CM

## 2021-07-25 DIAGNOSIS — N39498 Other specified urinary incontinence: Secondary | ICD-10-CM

## 2021-07-25 DIAGNOSIS — Z6824 Body mass index (BMI) 24.0-24.9, adult: Secondary | ICD-10-CM

## 2021-07-25 DIAGNOSIS — E669 Obesity, unspecified: Secondary | ICD-10-CM

## 2021-07-25 DIAGNOSIS — C649 Malignant neoplasm of unspecified kidney, except renal pelvis: Secondary | ICD-10-CM

## 2021-07-25 DIAGNOSIS — I120 Hypertensive chronic kidney disease with stage 5 chronic kidney disease or end stage renal disease: Secondary | ICD-10-CM | POA: Diagnosis not present

## 2021-07-25 DIAGNOSIS — N3281 Overactive bladder: Secondary | ICD-10-CM | POA: Diagnosis not present

## 2021-07-25 DIAGNOSIS — H2510 Age-related nuclear cataract, unspecified eye: Secondary | ICD-10-CM

## 2021-07-25 DIAGNOSIS — K219 Gastro-esophageal reflux disease without esophagitis: Secondary | ICD-10-CM | POA: Diagnosis not present

## 2021-07-25 DIAGNOSIS — N185 Chronic kidney disease, stage 5: Secondary | ICD-10-CM | POA: Diagnosis not present

## 2021-07-25 DIAGNOSIS — J309 Allergic rhinitis, unspecified: Secondary | ICD-10-CM | POA: Diagnosis not present

## 2021-07-25 DIAGNOSIS — M48061 Spinal stenosis, lumbar region without neurogenic claudication: Secondary | ICD-10-CM

## 2021-07-25 DIAGNOSIS — G4733 Obstructive sleep apnea (adult) (pediatric): Secondary | ICD-10-CM

## 2021-07-25 DIAGNOSIS — M1990 Unspecified osteoarthritis, unspecified site: Secondary | ICD-10-CM | POA: Diagnosis not present

## 2021-07-25 DIAGNOSIS — N401 Enlarged prostate with lower urinary tract symptoms: Secondary | ICD-10-CM

## 2021-07-25 DIAGNOSIS — H04123 Dry eye syndrome of bilateral lacrimal glands: Secondary | ICD-10-CM

## 2021-07-25 DIAGNOSIS — F4312 Post-traumatic stress disorder, chronic: Secondary | ICD-10-CM

## 2021-07-25 DIAGNOSIS — E1122 Type 2 diabetes mellitus with diabetic chronic kidney disease: Secondary | ICD-10-CM | POA: Diagnosis not present

## 2021-07-25 DIAGNOSIS — E1142 Type 2 diabetes mellitus with diabetic polyneuropathy: Secondary | ICD-10-CM

## 2021-07-25 DIAGNOSIS — Z7982 Long term (current) use of aspirin: Secondary | ICD-10-CM

## 2021-07-25 DIAGNOSIS — E785 Hyperlipidemia, unspecified: Secondary | ICD-10-CM | POA: Diagnosis not present

## 2021-07-25 DIAGNOSIS — Z8546 Personal history of malignant neoplasm of prostate: Secondary | ICD-10-CM

## 2021-07-25 DIAGNOSIS — K521 Toxic gastroenteritis and colitis: Secondary | ICD-10-CM | POA: Diagnosis not present

## 2021-07-25 DIAGNOSIS — L259 Unspecified contact dermatitis, unspecified cause: Secondary | ICD-10-CM

## 2021-07-25 DIAGNOSIS — F419 Anxiety disorder, unspecified: Secondary | ICD-10-CM | POA: Diagnosis not present

## 2021-07-25 DIAGNOSIS — M5416 Radiculopathy, lumbar region: Secondary | ICD-10-CM | POA: Diagnosis not present

## 2021-07-25 DIAGNOSIS — H90A32 Mixed conductive and sensorineural hearing loss, unilateral, left ear with restricted hearing on the contralateral side: Secondary | ICD-10-CM

## 2021-07-25 DIAGNOSIS — N179 Acute kidney failure, unspecified: Secondary | ICD-10-CM

## 2021-07-25 DIAGNOSIS — M205X9 Other deformities of toe(s) (acquired), unspecified foot: Secondary | ICD-10-CM

## 2021-07-26 ENCOUNTER — Other Ambulatory Visit: Payer: Self-pay | Admitting: Oncology

## 2021-08-03 ENCOUNTER — Other Ambulatory Visit: Payer: Self-pay

## 2021-08-03 ENCOUNTER — Ambulatory Visit (INDEPENDENT_AMBULATORY_CARE_PROVIDER_SITE_OTHER): Payer: Medicare Other | Admitting: Emergency Medicine

## 2021-08-03 ENCOUNTER — Encounter: Payer: Self-pay | Admitting: Emergency Medicine

## 2021-08-03 ENCOUNTER — Telehealth: Payer: Self-pay

## 2021-08-03 VITALS — BP 142/78 | HR 64 | Ht 71.0 in | Wt 200.0 lb

## 2021-08-03 DIAGNOSIS — N185 Chronic kidney disease, stage 5: Secondary | ICD-10-CM

## 2021-08-03 DIAGNOSIS — I10 Essential (primary) hypertension: Secondary | ICD-10-CM | POA: Diagnosis not present

## 2021-08-03 DIAGNOSIS — Z09 Encounter for follow-up examination after completed treatment for conditions other than malignant neoplasm: Secondary | ICD-10-CM | POA: Diagnosis not present

## 2021-08-03 DIAGNOSIS — E118 Type 2 diabetes mellitus with unspecified complications: Secondary | ICD-10-CM | POA: Diagnosis not present

## 2021-08-03 LAB — BASIC METABOLIC PANEL
BUN: 41 mg/dL — ABNORMAL HIGH (ref 6–23)
CO2: 15 mEq/L — ABNORMAL LOW (ref 19–32)
Calcium: 8.8 mg/dL (ref 8.4–10.5)
Chloride: 113 mEq/L — ABNORMAL HIGH (ref 96–112)
Creatinine, Ser: 3.86 mg/dL — ABNORMAL HIGH (ref 0.40–1.50)
GFR: 14.58 mL/min — CL (ref >60.00)
Glucose, Bld: 88 mg/dL (ref 70–99)
Potassium: 5.6 mEq/L — ABNORMAL HIGH (ref 3.5–5.1)
Sodium: 136 mEq/L (ref 135–145)

## 2021-08-03 NOTE — Telephone Encounter (Signed)
Got it, thanks!

## 2021-08-03 NOTE — Progress Notes (Signed)
Antonio Adams ?76 y.o. ? ? ?Chief Complaint  ?Patient presents with  ? Follow-up  ?  Dyspnea  ? ? ?HISTORY OF PRESENT ILLNESS: ?This is a 76 y.o. male patient of Dr. Scarlette Calico here for hospital discharge follow-up when he was admitted on 07/20/2021 with end-stage kidney disease and volume overload. ?Feeling better and stable today.  However he is decided not to undergo dialysis under any circumstances. ?Discharge summary as follows: ?Discharge Summary ?  ?Antonio Adams MGN:[id number] DOB: 1945-06-27 ?  ?PCP: [name], MD ?  ?Admit date: 07/20/2021 ?Discharge date: 07/22/2021 ?  ?Time spent: 35 minutes. ?  ?Recommendations for Outpatient Follow-up:  ?Follow-up with your nephrologist Dr. Hypertension in 1 to 2 weeks. ?Follow-up with cardiology in 1 to 2 weeks. ?Follow-up with your medical oncologist in 1 to 2 weeks. ?Follow-up with your primary care provider in 1 to 2 weeks. ?Take your medications as prescribed. ?Continue fall precautions. ?Obtain BMP or renal function test at your next nephrologist appointment. ?  ?Discharge Diagnoses:  ?     ?Active Hospital Problems  ?  Diagnosis Date Noted  ? Hyperkalemia 07/20/2021  ?    Priority: 1.  ? CKD (chronic kidney disease) stage 5, GFR less than 15 ml/min (HCC) 05/24/2021  ?    Priority: 2.  ? Anemia due to chronic kidney disease 07/20/2021  ?    Priority: 3.  ? Essential hypertension 03/16/2007  ?    Priority: 4.  ? Type 2 diabetes mellitus with diabetic cataract ([place]) 03/16/2007  ?    Priority: 5.  ?   ?Resolved Hospital Problems  ?No resolved problems to display.  ?  ?  ?Discharge Condition: Stable. ?  ? ?HPI ? ? ?Prior to Admission medications   ?Medication Sig Start Date End Date Taking? Authorizing Provider  ?Apoaequorin (PREVAGEN EXTRA STRENGTH PO) Take 1 tablet by mouth daily.   Yes [provider]  ?atorvastatin (LIPITOR) 20 MG tablet Take 1 tablet (20 mg total) by mouth at bedtime. 05/29/21 09/17/21 Yes Kayleen Memos, DO  ?ferrous sulfate  325 (65 FE) MG tablet Take 1 tablet (325 mg total) by mouth 2 (two) times daily. Patient needs office visit for further refills ?Patient taking differently: Take 325 mg by mouth 2 (two) times daily. 02/24/19  Yes Irene Shipper, MD  ?furosemide (LASIX) 40 MG tablet Take 1 tablet (40 mg total) by mouth daily. 07/20/21  Yes Scot Shiraishi, Ines Bloomer, MD  ?gabapentin (NEURONTIN) 100 MG capsule Take 100 mg by mouth at bedtime.   Yes [provider]  ?gemfibrozil (LOPID) 600 MG tablet Take 600 mg by mouth 2 (two) times daily before a meal.   Yes [provider]  ?Insulin Pen Needle 32G X 6 MM MISC 1 Act by Does not apply route daily. 05/23/20  Yes [name], MD  ?Iron-Vitamins (GERITOL) LIQD Take 10 mLs by mouth daily.   Yes [provider]  ?LOKELMA 10 g PACK packet Take 10 g by mouth every Monday, Wednesday, and Friday. 05/04/21  Yes [provider]  ?megestrol (MEGACE) 40 MG/ML suspension TAKE 10 MLS (400 MG TOTAL) BY MOUTH DAILY. 07/26/21  Yes Wyatt Portela, MD  ?metoprolol tartrate (LOPRESSOR) 25 MG tablet Take 0.5 tablets (12.5 mg total) by mouth 2 (two) times daily. 07/22/21 08/21/21 Yes Kayleen Memos, DO  ?multivitamin (RENA-VIT) TABS tablet Take 1 tablet by mouth at bedtime. 07/22/21 10/20/21 Yes Kayleen Memos, DO  ?Omega-3 Fatty Acids (  FISH OIL PO) Take 1 tablet by mouth daily.   Yes [provider]  ?ONE TOUCH ULTRA TEST test strip USE 2 (TWO) TIMES DAILY. AND LANCETS 2/DAY 250.03 11/05/16  Yes [name], MD  ?OVER THE COUNTER MEDICATION Take 1 tablet by mouth daily. Focus factor   Yes [provider]  ?polyvinyl alcohol (LIQUIFILM TEARS) 1.4 % ophthalmic solution Place 1 drop into both eyes daily as needed for dry eyes.   Yes [provider]  ?solifenacin (VESICARE) 5 MG tablet TAKE 1 TABLET (5 MG TOTAL) BY MOUTH DAILY. 06/02/21  Yes [name], MD  ?tamsulosin (FLOMAX) 0.4 MG CAPS capsule TAKE 1 CAPSULE (0.4 MG TOTAL) BY MOUTH DAILY. ?Patient  taking differently: Take 0.4 mg by mouth daily. 01/20/14  Yes [name], MD  ? ? ?Allergies  ?Allergen Reactions  ? Ace Inhibitors Swelling  ?  lisinopril  ? Xyzal [Levocetirizine] Other (See Comments)  ?  Break out  ? Opdivo [Nivolumab] Rash  ? ? ?Patient Active Problem List  ? Diagnosis Date Noted  ? Dyspnea on exertion 07/20/2021  ? Hyperkalemia 07/20/2021  ? Anemia due to chronic kidney disease 07/20/2021  ? Absolute anemia 06/03/2021  ? Drug-induced colitis   ? CKD (chronic kidney disease) stage 5, GFR less than 15 ml/min (HCC) 05/24/2021  ? BPH (benign prostatic hyperplasia) 05/24/2021  ? Dental caries on smooth surface penetrating into pulp 05/20/2021  ? Deposits (accretions) on teeth 05/20/2021  ? Diabetic renal disease ([place]) 05/20/2021  ? Dry eye syndrome of bilateral lacrimal glands 05/20/2021  ? Osteoarthrosis 05/20/2021  ? Other acquired deformity of toe 05/20/2021  ? Other specified disorder of rectum and anus 05/20/2021  ? Problem related to unspecified psychosocial circumstances 05/20/2021  ? Senile nuclear sclerosis 05/20/2021  ? Vitamin D deficiency 05/20/2021  ? Vitreous degeneration, unspecified eye 05/20/2021  ? Intrinsic eczema 04/18/2021  ? Atherosclerosis of aorta ([place]) 12/30/2020  ? Mixed conductive and sensorineural hearing loss of left ear with restricted hearing of right ear 09/01/2020  ? Tinnitus of both ears 05/20/2020  ? Metastatic renal cell carcinoma ([place]) 02/26/2020  ? Diabetic polyneuropathy associated with type 2 diabetes mellitus ([place]) 12/18/2019  ? Murmur, cardiac 10/08/2018  ? DDD (degenerative disc disease), cervical 10/08/2018  ? OAB (overactive bladder) 04/04/2018  ? Seasonal allergic rhinitis due to pollen 11/01/2016  ? Microalbuminuria due to type 2 diabetes mellitus ([place]) 07/27/2016  ? Varicose veins of both lower extremities 08/06/2015  ? Varicose veins of lower extremities with ulcer ([place]) 06/16/2015  ? Internal hemorrhoid, bleeding 08/03/2014  ? Post traumatic  stress disorder (PTSD) 01/29/2014  ? Chronic gingivitis, plaque induced 06/04/2012  ? Spinal stenosis of lumbar region with radiculopathy 10/30/2011  ?  Class: Chronic  ? Obstructive sleep apnea 11/24/2010  ? Obesity 11/24/2010  ? Routine general medical examination at a health care facility 11/24/2010  ? ADENOCARCINOMA, PROSTATE, GLEASON GRADE 6 12/21/2009  ? Type 2 diabetes mellitus with diabetic cataract ([place]) 03/16/2007  ? Other and unspecified hyperlipidemia 03/16/2007  ? Essential hypertension 03/16/2007  ? GERD 03/16/2007  ? ? ?Past Medical History:  ?Diagnosis Date  ? ADENOCARCINOMA, PROSTATE, GLEASON GRADE 6 12/21/2009  ? prostate cancer  ? ALLERGIC RHINITIS 03/16/2007  ? Allergy   ? Anxiety   ? Arthritis   ? back   ? BACK PAIN WITH RADICULOPATHY 11/23/2008  ? Cancer of kidney ([place])   ? partial right kidney removed  ? CARPAL TUNNEL SYNDROME, BILATERAL 03/16/2007  ?  Chronic back pain   ? Constipation   ? takes Senokot daily  ? Depression   ? occasionally  ? DIABETES MELLITUS, TYPE II 03/16/2007  ? takes Januvia and MEtformin daily  ? GASTROINTESTINAL HEMORRHAGE, HX OF 03/16/2007  ? GERD 03/16/2007  ? takes Omeprazole daily  ? Glaucoma   ? mild - no eye drops  ? Hemorrhoids   ? History of colon polyps   ? HYPERLIPIDEMIA 03/16/2007  ? takes Crestor daily  ? HYPERTENSION 03/16/2007  ? takes Diltiazem and Lisinopril daily   ? Kidney cancer, primary, with metastasis from kidney to other site [facility])   ? LEG CRAMPS 05/22/2007  ? Nocturia   ? Overactive bladder   ? PARESTHESIA 05/21/2007  ? Proctitis   ? PTSD (post-traumatic stress disorder)   ? wakes up may be dreaming of fighting  ? Rectal bleeding   ? Dr.Norins has explained its from the Radiation that he has received  ? Rectal bleeding   ? Renal cell carcinoma ([place])   ? Sleep apnea   ? uses CPAP nightly  ? Stomach cancer ([place])   ? Urinary frequency   ? takes Toviaz daily  ? ? ?Past Surgical History:  ?Procedure Laterality Date  ? ADRENALECTOMY Right 02/26/2020  ? Procedure:  OPEN RIGHT RENAL CELL METASTATECTOMY AND  ADRENALECTOMY;  Surgeon: [name], MD;  Location: [facility];  Service: Urology;  Laterality: Right;  ? [place], 2008  ? repeat surgery; [facility] '08

## 2021-08-03 NOTE — Patient Instructions (Signed)
Chronic Kidney Disease, Adult Chronic kidney disease is when lasting damage happens to the kidneys slowly over a long time. The kidneys help to: Make pee (urine). Make hormones. Keep the right amount of fluids and chemicals in the body. Most often, this disease does not go away. You must take steps to help keep the kidney damage from getting worse. If steps are not taken, the kidneys might stop working forever. What are the causes? Diabetes. High blood pressure. Diseases that affect the heart and blood vessels. Other kidney diseases. Diseases of the body's disease-fighting system. A problem with the flow of pee. Infections of the organs that make pee, store it, and take it out of the body. Swelling or irritation of your blood vessels. What increases the risk? Getting older. Having someone in your family who has kidney disease or kidney failure. Having a disease caused by genes. Taking medicines often that harm the kidneys. Being near or having contact with harmful substances. Being very overweight. Using tobacco now or in the past. What are the signs or symptoms? Feeling very tired. Having a swollen face, legs, ankles, or feet. Feeling like you may vomit or vomiting. Not feeling hungry. Being confused or not able to focus. Twitches and cramps in the leg muscles or other muscles. Dry, itchy skin. A taste of metal in your mouth. Making less pee, or making more pee. Shortness of breath. Trouble sleeping. You may also become anemic or get weak bones. Anemic means there is not enough red blood cells or hemoglobin in your blood. You may get symptoms slowly. You may not notice them until the kidney damage gets very bad. How is this treated? Often, there is no cure for this disease. Treatment can help with symptoms and help keep the disease from getting worse. You may need to: Avoid alcohol. Avoid foods that are high in salt, potassium, phosphorous, and protein. Take medicines for  symptoms and to help control other conditions. Have dialysis. This treatment gets harmful waste out of your body. Treat other problems that cause your kidney disease or make it worse. Follow these instructions at home: Medicines Take over-the-counter and prescription medicines only as told by your doctor. Do not take any new medicines, vitamins, or supplements unless your doctor says it is okay. Lifestyle  Do not smoke or use any products that contain nicotine or tobacco. If you need help quitting, ask your doctor. If you drink alcohol: Limit how much you use to: 0-1 drink a day for women who are not pregnant. 0-2 drinks a day for men. Know how much alcohol is in your drink. In the [place] one drink equals one 12 oz bottle of beer (355 mL), one 5 oz glass of wine (148 mL), or one 1 oz glass of hard liquor (44 mL). Stay at a healthy weight. If you need help losing weight, ask your doctor. General instructions  Follow instructions from your doctor about what you cannot eat or drink. Track your blood pressure at home. Tell your doctor about any changes. If you have diabetes, track your blood sugar. Exercise at least 30 minutes a day, 5 days a week. Keep your shots (vaccinations) up to date. Keep all follow-up visits. Where to find more information American Association of Kidney Patients: www.aakp.org National Kidney Foundation: www.kidney.org American Kidney Fund: www.akfinc.org Life Options: www.lifeoptions.org Kidney School: www.kidneyschool.org Contact a doctor if: Your symptoms get worse. You get new symptoms. Get help right away if: You get symptoms of end-stage kidney disease. These   include: Headaches. Losing feeling in your hands or feet. Easy bruising. Having hiccups often. Chest pain. Shortness of breath. Lack of menstrual periods, in women. You have a fever. You make less pee than normal. You have pain or you bleed when you pee or poop. These symptoms may be an  emergency. Get help right away. Call your local emergency services (911 in the [place] Do not wait to see if the symptoms will go away. Do not drive yourself to the hospital. Summary Chronic kidney disease is when lasting damage happens to the kidneys slowly over a long time. Causes of this disease include diabetes and high blood pressure. Often, there is no cure for this disease. Treatment can help symptoms and help keep the disease from getting worse. Treatment may involve lifestyle changes, medicines, and dialysis. This information is not intended to replace advice given to you by your health care provider. Make sure you discuss any questions you have with your health care provider. Document Revised: 08/06/2019 Document Reviewed: 08/06/2019 Elsevier Patient Education  2022 [employer] Inc.  

## 2021-08-03 NOTE — Telephone Encounter (Signed)
CRITICAL VALUE STICKER ? ?CRITICAL VALUE: GFR 14.58 ? ?RECEIVER (on-site recipient of call): Jarrett Soho ? ?DATE & TIME NOTIFIED: 08/03/2021 at 2:40 pm ? ?MESSENGER (representative from lab): Saa ? ?MD NOTIFIED: Dr. Ronnald Ramp ? ?TIME OF NOTIFICATION: 2:41 pm ? ?

## 2021-08-12 ENCOUNTER — Ambulatory Visit (INDEPENDENT_AMBULATORY_CARE_PROVIDER_SITE_OTHER): Payer: Medicare Other | Admitting: Internal Medicine

## 2021-08-12 ENCOUNTER — Encounter: Payer: Self-pay | Admitting: Internal Medicine

## 2021-08-12 VITALS — BP 166/74 | HR 84 | Temp 98.1°F | Resp 16 | Ht 71.0 in | Wt 203.0 lb

## 2021-08-12 DIAGNOSIS — R0609 Other forms of dyspnea: Secondary | ICD-10-CM

## 2021-08-12 DIAGNOSIS — I5032 Chronic diastolic (congestive) heart failure: Secondary | ICD-10-CM | POA: Insufficient documentation

## 2021-08-12 DIAGNOSIS — B356 Tinea cruris: Secondary | ICD-10-CM | POA: Diagnosis not present

## 2021-08-12 DIAGNOSIS — E1121 Type 2 diabetes mellitus with diabetic nephropathy: Secondary | ICD-10-CM | POA: Diagnosis not present

## 2021-08-12 DIAGNOSIS — E1136 Type 2 diabetes mellitus with diabetic cataract: Secondary | ICD-10-CM | POA: Diagnosis not present

## 2021-08-12 MED ORDER — CLOTRIMAZOLE-BETAMETHASONE 1-0.05 % EX CREA: TOPICAL_CREAM | Freq: Two times a day (BID) | CUTANEOUS | 2 refills | Status: DC

## 2021-08-12 MED ORDER — FUROSEMIDE 40 MG PO TABS: 40.0000 mg | ORAL_TABLET | Freq: Two times a day (BID) | ORAL | 0 refills | Status: DC

## 2021-08-12 NOTE — Patient Instructions (Signed)
Heart Failure, Diagnosis ?Heart failure is a condition in which the heart has trouble pumping blood. This may mean that the heart cannot pump enough blood out to the body or that the heart does not fill up with enough blood. For some people with heart failure, fluid may back up into the lungs. There may also be swelling (edema) in the lower legs. Heart failure is usually a long-term (chronic) condition. It is important for you to take good care of yourself and follow the treatment plan from your health care provider. ?Different stages of heart failure have different treatment plans. The stages are: ?Stage A: At risk for heart failure. ?Having no symptoms of heart failure, but being at risk for developing heart failure. ?Stage B: Pre-heart failure. ?Having no symptoms of heart failure, but having structural changes to the heart that indicate heart failure. ?Stage C: Symptomatic heart failure. ?Having symptoms of heart failure in addition to structural changes to the heart that indicate heart failure. ?Stage D: Advanced heart failure. ?Having symptoms that interfere with daily life and frequent hospitalizations related to heart failure. ?What are the causes? ?This condition may be caused by: ?High blood pressure (hypertension). Hypertension causes the heart muscle to work harder than normal. ?Coronary artery disease, or CAD. CAD is the buildup of cholesterol and fat (plaque) in the arteries of the heart. ?Heart attack, also called myocardial infarction. This injures the heart muscle, making it hard for the heart to pump blood. ?Abnormal heart valves. The valves do not open and close properly, forcing the heart to pump harder to keep the blood flowing. ?Heart muscle disease, inflammation, or infection (cardiomyopathy or myocarditis). This is damage to the heart muscle. It can increase the risk of heart failure. ?Lung disease. The heart works harder when the lungs are not healthy. ?What increases the risk? ?The risk of  heart failure increases as a person ages. This condition is also more likely to develop in people who: ?Are obese. ?Use tobacco or nicotine products. ?Abuse alcohol or drugs. ?Have taken medicines that can damage the heart, such as chemotherapy drugs. ?Have any of these conditions: ?Diabetes. ?Abnormal heart rhythms. ?Thyroid problems. ?Low blood counts (anemia). ?Chronic kidney disease. ?Have a family history of heart failure. ?What are the signs or symptoms? ?Symptoms of this condition include: ?Shortness of breath with activity, such as when climbing stairs. ?A cough that does not go away. ?Swelling of the feet, ankles, legs, or abdomen. ?Losing or gaining weight for no reason. ?Trouble breathing when lying flat. ?Waking from sleep because of the need to sit up and get more air. ?Rapid heartbeat. ?Other symptoms may include: ?Tiredness (fatigue) and loss of energy. ?Feeling light-headed, dizzy, or close to fainting. ?Nausea or loss of appetite. ?Waking up more often during the night to urinate (nocturia). ?Confusion. ?How is this diagnosed? ?This condition is diagnosed based on: ?Your medical history, symptoms, and a physical exam. ?Blood tests. ?Diagnostic tests, which may include: ?Echocardiogram. ?Electrocardiogram (ECG). ?Chest X-ray. ?Exercise stress test. ?Cardiac MRI. ?Cardiac catheterization and angiogram. ?Radionuclide scans. ?How is this treated? ?Treatment for this condition is aimed at managing the symptoms of heart failure. ?Medicines ?Treatment may include medicines that: ?Help lower blood pressure by relaxing (dilating) the blood vessels. These medicines are called ACE inhibitors (angiotensin-converting enzyme), ARBs (angiotensin receptor blockers), or vasodilators. ?Cause the kidneys to remove salt and water from the blood through urination (diuretics). ?Improve heart muscle strength and prevent the heart from beating too fast (beta blockers). ?Increase the force  of the heartbeat (digoxin). ?Lower  heart rates. ?Certain diabetes medicines (SGLT-2 inhibitors) may also be used in treatment. ?Healthy behavior changes ?Treatment may also include making healthy lifestyle changes, such as: ?Reaching and staying at a healthy weight. ?Not using tobacco or nicotine products. ?Eating heart-healthy foods. ?Limiting or avoiding alcohol. ?Stopping the use of illegal drugs. ?Being physically active. ?Participating in a cardiac rehabilitation program, which is a treatment program to improve your health and well-being through exercise training, education, and counseling. ?Other treatments ?Other treatments may include: ?Procedures to open blocked arteries or repair damaged valves. ?Placing a pacemaker to improve heart function (cardiac resynchronization therapy). ?Placing a device to treat serious abnormal heart rhythms (implantable cardioverter defibrillator, or ICD). ?Placing a device to improve the pumping ability of the heart (left ventricular assist device, or LVAD). ?Receiving a healthy heart from a donor (heart transplant). This is done when other treatments have not helped. ?Follow these instructions at home: ?Manage other health conditions as told by your health care provider. These may include hypertension, diabetes, thyroid disease, or abnormal heart rhythms. ?Get ongoing education and support as needed. Learn as much as you can about heart failure. ?Keep all follow-up visits. This is important. ?Where to find more information ?American Heart Association: www.heart.org ?Centers for Disease Control and Prevention: http://www.wolf.info/ ?[place] on Aging: http://kim-miller.com/ ?Summary ?Heart failure is a condition in which the heart has trouble pumping blood. ?This condition is commonly caused by high blood pressure and other diseases of the heart and lungs. ?Symptoms of this condition include shortness of breath, tiredness (fatigue), nausea, and swelling of the feet, ankles, legs, or abdomen. ?Treatments for this  condition may include medicines, lifestyle changes, and surgery. ?Manage other health conditions as told by your health care provider. ?This information is not intended to replace advice given to you by your health care provider. Make sure you discuss any questions you have with your health care provider. ?Document Revised: 10/28/2020 Document Reviewed: 11/22/2019 ?Elsevier Patient Education ? [place]. ? ?

## 2021-08-12 NOTE — Progress Notes (Signed)
? ?Subjective:  ?Patient ID: Antonio Adams, male    DOB: [birth year]  Age: 76 y.o. MRN: [id number] ? ?CC: Congestive Heart Failure ? ?This visit occurred during the SARS-CoV-2 public health emergency.  Safety protocols were in place, including screening questions prior to the visit, additional usage of staff PPE, and extensive cleaning of exam room while observing appropriate contact time as indicated for disinfecting solutions.   ? ?HPI ?Antonio Adams presents for f/up - ? ?He was recently admitted and diagnosed with diastolic dysfunction.  He is taking a once daily dose of a loop diuretic but continues to complain of painless lower extremity edema.  He denies chest pain, shortness of breath, or diaphoresis.  He has decided not to do hemodialysis. ? ?Outpatient Medications Prior to Visit  ?Medication Sig Dispense Refill  ? Apoaequorin (PREVAGEN EXTRA STRENGTH PO) Take 1 tablet by mouth daily.    ? atorvastatin (LIPITOR) 20 MG tablet Take 1 tablet (20 mg total) by mouth at bedtime. 30 tablet 0  ? ferrous sulfate 325 (65 FE) MG tablet Take 1 tablet (325 mg total) by mouth 2 (two) times daily. Patient needs office visit for further refills (Patient taking differently: Take 325 mg by mouth 2 (two) times daily.) 60 tablet 1  ? gabapentin (NEURONTIN) 100 MG capsule Take 100 mg by mouth at bedtime.    ? gemfibrozil (LOPID) 600 MG tablet Take 600 mg by mouth 2 (two) times daily before a meal.    ? Insulin Pen Needle 32G X 6 MM MISC 1 Act by Does not apply route daily. 100 each 1  ? Iron-Vitamins (GERITOL) LIQD Take 10 mLs by mouth daily.    ? LOKELMA 10 g PACK packet Take 10 g by mouth every Monday, Wednesday, and Friday.    ? megestrol (MEGACE) 40 MG/ML suspension TAKE 10 MLS (400 MG TOTAL) BY MOUTH DAILY. 240 mL 0  ? metoprolol tartrate (LOPRESSOR) 25 MG tablet Take 0.5 tablets (12.5 mg total) by mouth 2 (two) times daily. 30 tablet 0  ? multivitamin (RENA-VIT) TABS tablet Take 1 tablet by mouth at bedtime. 90  tablet 0  ? Omega-3 Fatty Acids (FISH OIL PO) Take 1 tablet by mouth daily.    ? ONE TOUCH ULTRA TEST test strip USE 2 (TWO) TIMES DAILY. AND LANCETS 2/DAY 250.03 100 each 11  ? OVER THE COUNTER MEDICATION Take 1 tablet by mouth daily. Focus factor    ? polyvinyl alcohol (LIQUIFILM TEARS) 1.4 % ophthalmic solution Place 1 drop into both eyes daily as needed for dry eyes.    ? solifenacin (VESICARE) 5 MG tablet TAKE 1 TABLET (5 MG TOTAL) BY MOUTH DAILY. 30 tablet 5  ? tamsulosin (FLOMAX) 0.4 MG CAPS capsule TAKE 1 CAPSULE (0.4 MG TOTAL) BY MOUTH DAILY. (Patient taking differently: Take 0.4 mg by mouth daily.) 30 capsule 1  ? furosemide (LASIX) 40 MG tablet Take 1 tablet (40 mg total) by mouth daily. 30 tablet 3  ? ?No facility-administered medications prior to visit.  ? ? ?ROS ?Review of Systems  ?Constitutional:  Positive for unexpected weight change (wt gain). Negative for chills, diaphoresis and fatigue.  ?HENT: Negative.    ?Eyes: Negative.   ?Respiratory:  Negative for cough, chest tightness, shortness of breath and wheezing.   ?Cardiovascular:  Positive for leg swelling. Negative for chest pain and palpitations.  ?Gastrointestinal:  Negative for abdominal pain, diarrhea and nausea.  ?Endocrine: Negative.   ?Genitourinary: Negative.  Negative for difficulty urinating.  ?  Musculoskeletal: Negative.   ?Skin:  Positive for rash. Negative for color change and pallor.  ?Neurological:  Negative for dizziness, weakness, light-headedness and numbness.  ?Hematological:  Negative for adenopathy. Does not bruise/bleed easily.  ?Psychiatric/Behavioral: Negative.  Negative for sleep disturbance.   ? ?Objective:  ?BP (!) 166/74 (BP Location: Left Arm, Patient Position: Sitting, Cuff Size: Large)   Pulse 84   Temp 98.1 ?F (36.7 ?C) (Oral)   Resp 16   Ht '5\' 11"'$  (1.803 m)   Wt 203 lb (92.1 kg)   SpO2 98%   BMI 28.31 kg/m?  ? ?BP Readings from Last 3 Encounters:  ?08/12/21 (!) 166/74  ?08/03/21 (!) 142/78  ?07/22/21 133/83   ? ? ?Wt Readings from Last 3 Encounters:  ?08/12/21 203 lb (92.1 kg)  ?08/03/21 200 lb (90.7 kg)  ?07/22/21 177 lb 14.6 oz (80.7 kg)  ? ? ?Physical Exam ?HENT:  ?   Mouth/Throat:  ?   Mouth: Mucous membranes are moist.  ?Eyes:  ?   General: No scleral icterus. ?   Conjunctiva/sclera: Conjunctivae normal.  ?Cardiovascular:  ?   Rate and Rhythm: Normal rate and regular rhythm.  ?   Heart sounds: No murmur heard. ?Pulmonary:  ?   Effort: Pulmonary effort is normal.  ?   Breath sounds: No stridor. No wheezing, rhonchi or rales.  ?Abdominal:  ?   General: Abdomen is flat.  ?   Palpations: Abdomen is soft. There is no mass.  ?   Tenderness: There is no abdominal tenderness. There is no guarding.  ?   Hernia: No hernia is present.  ?Musculoskeletal:     ?   General: Normal range of motion.  ?   Cervical back: Neck supple.  ?   Right lower leg: 1+ Pitting Edema present.  ?   Left lower leg: 1+ Pitting Edema present.  ?Lymphadenopathy:  ?   Cervical: No cervical adenopathy.  ?Skin: ?   General: Skin is warm and dry.  ?   Coloration: Skin is not pale.  ?Neurological:  ?   General: No focal deficit present.  ?   Mental Status: He is alert and oriented to person, place, and time. Mental status is at baseline.  ?Psychiatric:     ?   Mood and Affect: Mood normal.     ?   Behavior: Behavior normal.  ? ? ?Lab Results  ?Component Value Date  ? WBC 4.7 07/22/2021  ? HGB 10.2 (L) 07/22/2021  ? HCT 30.0 (L) 07/22/2021  ? PLT 217 07/22/2021  ? GLUCOSE 88 08/03/2021  ? CHOL 136 07/22/2021  ? TRIG 46 07/22/2021  ? HDL 34 (L) 07/22/2021  ? [address] 93 07/22/2021  ? ALT 24 07/22/2021  ? AST 32 07/22/2021  ? NA 136 08/03/2021  ? K 5.6 (H) 08/03/2021  ? CL 113 (H) 08/03/2021  ? CREATININE 3.86 (H) 08/03/2021  ? BUN 41 (H) 08/03/2021  ? CO2 15 (L) 08/03/2021  ? TSH 0.848 04/20/2021  ? PSA 0.01 (L) 05/20/2020  ? INR 1.2 05/24/2021  ? HGBA1C 5.8 (H) 05/23/2021  ? MICROALBUR 97.0 12/18/2019  ? ? ?DG Chest 2 View ? ?Result Date: 07/20/2021 ?CLINICAL  DATA:  Shortness of breath.  Abnormal kidney function. EXAM: CHEST - 2 VIEW COMPARISON:  Radiograph earlier today. FINDINGS: Lower lung volumes from exam earlier today. Stable heart size and mediastinal contours allowing for differences in technique. Minor basilar atelectasis without confluent consolidation. No pneumothorax, pleural effusion, or pulmonary edema. No  acute osseous findings. IMPRESSION: Lower lung volumes from earlier today with mild basilar atelectasis. Electronically Signed   By: Keith Rake M.D.   On: 07/20/2021 20:39  ? ?DG Chest 2 View ? ?Result Date: 07/20/2021 ?CLINICAL DATA:  Dyspnea on exertion for 2 months EXAM: CHEST - 2 VIEW COMPARISON:  Chest radiograph 05/23/2021 FINDINGS: The heart is at the upper limits of normal for size. The mediastinal contours are within normal limits. There is no focal consolidation or pulmonary edema. There is no pleural effusion or pneumothorax. There is no acute osseous abnormality. IMPRESSION: No radiographic evidence of acute cardiopulmonary process. Electronically Signed   By: Valetta Mole M.D.   On: 07/20/2021 11:34  ? ?ECHOCARDIOGRAM COMPLETE ? ?Result Date: 07/21/2021 ?   ECHOCARDIOGRAM REPORT   Patient Name:   Antonio Adams Date of Exam: 07/21/2021 Medical Rec #:  [id number]           Height:       71.0 in Accession #:    [id number]          Weight:       186.1 lb Date of Birth:  May 31, 1945          BSA:          2.045 m? Patient Age:    39 years            BP:           148/86 mmHg Patient Gender: M                   HR:           72 bpm. Exam Location:  Inpatient Procedure: 2D Echo, 3D Echo, Cardiac Doppler and Color Doppler Indications:    CHF-Acute Diastolic E75.17  History:        Patient has no prior history of Echocardiogram examinations.                 Risk Factors:Sleep Apnea, Hypertension, Dyslipidemia and                 Diabetes.  Sonographer:    Bernadene Person RDCS Referring Phys: [id number] [place]  1. Left ventricular  ejection fraction, by estimation, is 50%. Left ventricular ejection fraction by 3D volume is 48 %. The left ventricle has mildly decreased function. The left ventricle demonstrates global hypokinesis. Left v

## 2021-08-17 ENCOUNTER — Encounter (HOSPITAL_BASED_OUTPATIENT_CLINIC_OR_DEPARTMENT_OTHER): Payer: Self-pay | Admitting: Cardiology

## 2021-08-17 ENCOUNTER — Ambulatory Visit (HOSPITAL_BASED_OUTPATIENT_CLINIC_OR_DEPARTMENT_OTHER): Payer: Medicare Other | Admitting: Cardiology

## 2021-08-17 VITALS — BP 158/82 | HR 76 | Ht 71.0 in | Wt 199.8 lb

## 2021-08-17 DIAGNOSIS — I10 Essential (primary) hypertension: Secondary | ICD-10-CM

## 2021-08-17 DIAGNOSIS — I2510 Atherosclerotic heart disease of native coronary artery without angina pectoris: Secondary | ICD-10-CM | POA: Diagnosis not present

## 2021-08-17 DIAGNOSIS — N185 Chronic kidney disease, stage 5: Secondary | ICD-10-CM | POA: Diagnosis not present

## 2021-08-17 DIAGNOSIS — I5032 Chronic diastolic (congestive) heart failure: Secondary | ICD-10-CM | POA: Diagnosis not present

## 2021-08-17 DIAGNOSIS — Z7189 Other specified counseling: Secondary | ICD-10-CM

## 2021-08-17 MED ORDER — CARVEDILOL 6.25 MG PO TABS: 6.2500 mg | ORAL_TABLET | Freq: Two times a day (BID) | ORAL | 3 refills | Status: DC

## 2021-08-17 NOTE — Patient Instructions (Addendum)
Medication Instructions:  ?STOP- Metoprolol Tartrate ?START- Carvedilol 6.25 mg by mouth twice a day ? ?*If you need a refill on your cardiac medications before your next appointment, please call your pharmacy* ? ? ?Lab Work: ?None Ordered ? ? ? ?Testing/Procedures: ?None Ordered ? ? ?Follow-Up: ?At [facility], you and your health needs are our priority.  As part of our continuing mission to provide you with exceptional heart care, we have created designated Provider Care Teams.  These Care Teams include your primary Cardiologist (physician) and Advanced Practice Providers (APPs -  Physician Assistants and Nurse Practitioners) who all work together to provide you with the care you need, when you need it. ? ?We recommend signing up for the patient portal called "MyChart".  Sign up information is provided on this After Visit Summary.  MyChart is used to connect with patients for Virtual Visits (Telemedicine).  Patients are able to view lab/test results, encounter notes, upcoming appointments, etc.  Non-urgent messages can be sent to your provider as well.   ?To learn more about what you can do with MyChart, go to [email or web address].   ? ?Your next appointment:   ?6 month(s) ? ?The format for your next appointment:   ?In Person ? ?Provider:   ?Buford Dresser, MD  ? ? ?Other Instructions ? ?how to check blood pressure: ? -sit comfortably in a chair, feet uncrossed and flat on floor, for 5-10 minutes ? -arm ideally should rest at the level of the heart. However, arm should be relaxed and not tense (for example, do not hold the arm up unsupported) ? -avoid exercise, caffeine, and tobacco for at least 30 minutes prior to BP reading ? -don't take BP cuff reading over clothes (always place on skin directly) ? -I prefer to know how well the medication is working, so I would like you to take your readings 1-2 hours after taking your blood pressure medication if possible  ?

## 2021-08-17 NOTE — Progress Notes (Signed)
?Cardiology Office Note:   ? ?Date:  08/19/2021  ? ?ID:  Antonio Adams, DOB [birth year], MRN [id number] ? ?PCP:  [name], MD  ?Cardiologist:  None ? ?Referring MD: [name], MD  ? ?Chief Complaint  ?Patient presents with  ? Edema  ?  Pt said he has swelling in legs and feet bilateral.   ? ? ?History of Present Illness:   ? ?Antonio Adams is a 76 y.o. male with a hx of hypertension, and hyperlipidemia,  who is seen for post hospital follow-up. ? ?Overall, he is feeling fine since he left the ED. He is accompanied by a family member. ? ?He continues to notice that he has fluid in his feet, but this is better since his hospitalization. They note that his BP has always been low until he developed issues with LE edema. He urinates frequently from the lasix. ? ?At home his weight fluctuates in the 200's.  ? ?When he is maneuvering in his home, he experiences SOB. He notes that his shortness of breath has improved. He has not had any chest pains since he left the ED. ? ?He is managing his diet by not consuming much salt. ? ?He denies any palpitations, chest pain, shortness of breath, or peripheral edema. No lightheadedness, headaches, syncope, orthopnea, or PND. ? ?Past Medical History:  ?Diagnosis Date  ? ADENOCARCINOMA, PROSTATE, GLEASON GRADE 6 12/21/2009  ? prostate cancer  ? ALLERGIC RHINITIS 03/16/2007  ? Allergy   ? Anxiety   ? Arthritis   ? back   ? BACK PAIN WITH RADICULOPATHY 11/23/2008  ? Cancer of kidney ([place])   ? partial right kidney removed  ? CARPAL TUNNEL SYNDROME, BILATERAL 03/16/2007  ? Chronic back pain   ? Constipation   ? takes Senokot daily  ? Depression   ? occasionally  ? DIABETES MELLITUS, TYPE II 03/16/2007  ? takes Januvia and MEtformin daily  ? GASTROINTESTINAL HEMORRHAGE, HX OF 03/16/2007  ? GERD 03/16/2007  ? takes Omeprazole daily  ? Glaucoma   ? mild - no eye drops  ? Hemorrhoids   ? History of colon polyps   ? HYPERLIPIDEMIA 03/16/2007  ? takes Crestor daily  ? HYPERTENSION  03/16/2007  ? takes Diltiazem and Lisinopril daily   ? Kidney cancer, primary, with metastasis from kidney to other site [facility])   ? LEG CRAMPS 05/22/2007  ? Nocturia   ? Overactive bladder   ? PARESTHESIA 05/21/2007  ? Proctitis   ? PTSD (post-traumatic stress disorder)   ? wakes up may be dreaming of fighting  ? Rectal bleeding   ? Dr.Norins has explained its from the Radiation that he has received  ? Rectal bleeding   ? Renal cell carcinoma ([place])   ? Sleep apnea   ? uses CPAP nightly  ? Stomach cancer ([place])   ? Urinary frequency   ? takes Toviaz daily  ? ? ?Past Surgical History:  ?Procedure Laterality Date  ? ADRENALECTOMY Right 02/26/2020  ? Procedure: OPEN RIGHT RENAL CELL METASTATECTOMY AND  ADRENALECTOMY;  Surgeon: [name], MD;  Location: [facility];  Service: Urology;  Laterality: Right;  ? [place], 2008  ? repeat surgery; ESI '08  ? CARPAL TUNNEL RELEASE    ? bilateral  ? CERVICAL FUSION    ? COLONOSCOPY    ? colonosocpy    ? ESOPHAGOGASTRODUODENOSCOPY    ? ED  ? FLEXIBLE SIGMOIDOSCOPY N/A 01/08/2017  ? Procedure: FLEXIBLE SIGMOIDOSCOPY;  Surgeon: Irene Shipper, MD;  Location: [facility] ENDOSCOPY;  Service: Endoscopy;  Laterality: N/A;  ? HOT HEMOSTASIS N/A 01/08/2017  ? Procedure: HOT HEMOSTASIS (ARGON PLASMA COAGULATION/BICAP);  Surgeon: Irene Shipper, MD;  Location: [facility] ENDOSCOPY;  Service: Endoscopy;  Laterality: N/A;  ? IR RADIOLOGIST EVAL & MGMT  12/17/2019  ? IR RADIOLOGIST EVAL & MGMT  07/01/2020  ? IR RADIOLOGIST EVAL & MGMT  08/25/2020  ? LUMBAR LAMINECTOMY  10/30/2011  ? Procedure: MICRODISCECTOMY LUMBAR LAMINECTOMY;  Surgeon: Jessy Oto, MD;  Location: [place];  Service: Orthopedics;  Laterality: Right;  Right L4-5 and L5-S1 Microdiscectomy  ? LUMBAR LAMINECTOMY  06/17/2012  ? Procedure: MICRODISCECTOMY LUMBAR LAMINECTOMY;  Surgeon: Jessy Oto, MD;  Location: [place];  Service: Orthopedics;  Laterality: N/A;  Right L5-S1 microdiscectomy  ? OPEN PARTIAL HEPATECTOMY  N/A 02/26/2020  ? Procedure:  OPEN PARTIAL HEPATECTOMY, CHOLECYSTECTOMY;  Surgeon: Stark Klein, MD;  Location: [facility];  Service: General;  Laterality: N/A;  ? POLYPECTOMY    ? PROSTATE SURGERY  march 2012  ? seed implant  ? RADIOFREQUENCY ABLATION N/A 07/21/2020  ? Procedure: CT MICROWAVE ABLATION;  Surgeon: Criselda Peaches, MD;  Location: [facility];  Service: Anesthesiology;  Laterality: N/A;  ? ROBOTIC ASSITED PARTIAL NEPHRECTOMY Right 09/08/2015  ? Procedure: XI ROBOTIC ASSITED RIGHT PARTIAL NEPHRECTOMY;  Surgeon: [name], MD;  Location: [facility];  Service: Urology;  Laterality: Right;  Clamp on: 1033 ?Clamp off: 1103 ?Total Clamp Time: 30 minutes  ? SHOULDER ARTHROSCOPY WITH SUBACROMIAL DECOMPRESSION Left 01/20/2016  ? Procedure: LEFT SHOULDER ARTHROSCOPY WITH EXTENSIVE  DEBRIDEMENT, ACROMIOPLASTY;  Surgeon: Ninetta Lights, MD;  Location: [place];  Service: Orthopedics;  Laterality: Left;  ? SHOULDER SURGERY  LFT  ? Stress Cardiolite  12/24/2003  ? negative for ischemia  ? UPPER ESOPHAGEAL ENDOSCOPIC ULTRASOUND (EUS)  04/18/2016  ? [facility]  ? UPPER GASTROINTESTINAL ENDOSCOPY    ? WOUND EXPLORATION    ? management of a wound that he sustained in the [place]  ? ? ?Current Medications: ?Current Outpatient Medications on File Prior to Visit  ?Medication Sig  ? Apoaequorin (PREVAGEN EXTRA STRENGTH PO) Take 1 tablet by mouth daily.  ? atorvastatin (LIPITOR) 20 MG tablet Take 1 tablet (20 mg total) by mouth at bedtime.  ? clotrimazole-betamethasone (LOTRISONE) cream Apply topically 2 (two) times daily.  ? ferrous sulfate 325 (65 FE) MG tablet Take 1 tablet (325 mg total) by mouth 2 (two) times daily. Patient needs office visit for further refills (Patient taking differently: Take 325 mg by mouth 2 (two) times daily.)  ? furosemide (LASIX) 40 MG tablet Take 1 tablet (40 mg total) by mouth 2 (two) times daily.  ? gabapentin (NEURONTIN) 100 MG capsule Take 100 mg by mouth at bedtime.  ? gemfibrozil (LOPID) 600 MG tablet  Take 600 mg by mouth 2 (two) times daily before a meal.  ? Iron-Vitamins (GERITOL) LIQD Take 10 mLs by mouth daily.  ? LOKELMA 10 g PACK packet Take 10 g by mouth daily.  ? megestrol (MEGACE) 40 MG/ML suspension TAKE 10 MLS (400 MG TOTAL) BY MOUTH DAILY.  ? multivitamin (RENA-VIT) TABS tablet Take 1 tablet by mouth at bedtime.  ? Omega-3 Fatty Acids (FISH OIL PO) Take 1 tablet by mouth daily.  ? OVER THE COUNTER MEDICATION Take 1 tablet by mouth daily. Focus factor  ? polyvinyl alcohol (LIQUIFILM TEARS) 1.4 % ophthalmic solution Place 1 drop into both eyes daily as  needed for dry eyes.  ? solifenacin (VESICARE) 5 MG tablet TAKE 1 TABLET (5 MG TOTAL) BY MOUTH DAILY.  ? tamsulosin (FLOMAX) 0.4 MG CAPS capsule TAKE 1 CAPSULE (0.4 MG TOTAL) BY MOUTH DAILY. (Patient taking differently: Take 0.4 mg by mouth daily.)  ? ?No current facility-administered medications on file prior to visit.  ?  ? ?Allergies:   Ace inhibitors, Xyzal [levocetirizine], and Opdivo [nivolumab]  ? ?Social History  ? ?Tobacco Use  ? Smoking status: Never  ? Smokeless tobacco: Never  ?Vaping Use  ? Vaping Use: Never used  ?Substance Use Topics  ? Alcohol use: Not Currently  ?  Alcohol/week: 0.0 standard drinks  ? Drug use: No  ? ? ?Family History: ?family history includes Cancer (age of onset: 57) in his sister; Hypertension in an other family member. There is no history of Colon cancer, Esophageal cancer, Stomach cancer, Diabetes, Colon polyps, or Rectal cancer. ? ?ROS:   ?Please see the history of present illness.  Additional pertinent ROS: ?Constitutional: Negative for chills, fever, night sweats, unintentional weight loss  ?HENT: Negative for ear pain and hearing loss.   ?Eyes: Negative for loss of vision and eye pain.  ?Respiratory: Negative for cough, sputum, wheezing. Positive for shortness of breath. ?Cardiovascular: See HPI. ?Gastrointestinal: Negative for abdominal pain, melena, and hematochezia.  ?Genitourinary: Negative for dysuria and  hematuria.  ?Musculoskeletal: Negative for falls and myalgias.  ?Skin: Negative for itching and rash.  ?Neurological: Negative for focal weakness, focal sensory changes and loss of consciousness.  ?Endo/He

## 2021-08-18 ENCOUNTER — Ambulatory Visit (HOSPITAL_BASED_OUTPATIENT_CLINIC_OR_DEPARTMENT_OTHER): Payer: Medicare Other | Admitting: Family

## 2021-08-19 ENCOUNTER — Encounter (HOSPITAL_BASED_OUTPATIENT_CLINIC_OR_DEPARTMENT_OTHER): Payer: Self-pay | Admitting: Cardiology

## 2021-08-23 ENCOUNTER — Other Ambulatory Visit: Payer: Self-pay | Admitting: Oncology

## 2021-08-26 ENCOUNTER — Telehealth: Payer: Self-pay | Admitting: Cardiology

## 2021-08-26 MED ORDER — METOPROLOL TARTRATE 25 MG PO TABS: 25.0000 mg | ORAL_TABLET | Freq: Two times a day (BID) | ORAL | 3 refills | Status: DC

## 2021-08-26 NOTE — Telephone Encounter (Signed)
RN returned call to patient to discuss [facility] and Feet and leg swelling  ? ?Different than at appointment- used something at the house last night- helped him out a lot that allowed him to wear his shoes, he cannot tell me what this is, but endorses most of his swelling has resolved just not in his feet and legs ? ?SHOB- happens occasionally- same as MD office, no worse, patient not SHOB on the phone  ? ?Not checking weight regularly and cannot tell if he is up or not.  ? ?HR- 156 this am, checked his blood pressure but cannot remember the numbers ? ?Patient is taking Lasix '40mg'$  twice daily.  ? ? ?Routing to Laurann Montana, NP for advisement  ? ? ? ?"Last seen 08/17/21 by Dr. Harrell Gave. Noted shortness of breath while maneuvering his home which was overall improving. He has mild lower extremity edema per her note. Metoprolol was transitioned to Carvedilol due to elevated blood pressure. ?  ?Low suspicion Carvedilol causing shortness of breath.  ?  ?Please ask patient: ?Any recent weights or BP readings? ?Breathing worse than when last seen or overall stable? When does he notice shortness of breath? (With activity, at rest, after medications) ?Swelling worse or stable from previous? ?  ?This will help [place] to determine next steps. ?  ?Antonio Dubonnet, NP " ?

## 2021-08-26 NOTE — Telephone Encounter (Signed)
Last seen 08/17/21 by Dr. Harrell Gave. Noted shortness of breath while maneuvering his home which was overall improving. He has mild lower extremity edema per her note. Metoprolol was transitioned to Carvedilol due to elevated blood pressure. ? ?Low suspicion Carvedilol causing shortness of breath.  ? ?Please ask patient: ?Any recent weights or BP readings? ?Breathing worse than when last seen or overall stable? When does he notice shortness of breath? (With activity, at rest, after medications) ?Swelling worse or stable from previous? ? ?This will help [place] to determine next steps. ? ?Antonio Dubonnet, NP  ?

## 2021-08-26 NOTE — Telephone Encounter (Signed)
RN called patient with the following recommendations and sent in new prescription to the pharmacy. Patient declined sooner follow up and should reach out Monday with his BP/HR/WT through the weekend.  ? ? ?"Low suspicion Carvedilol contributory to edema. However, would be reasonable to stop Carvedilol and resume Metoprolol Tartrate '25mg'$  BID.  ? ?Ensure drinking less than 2L (64 oz) fluid per day, following low salt diet, elevating legs when sitting, and wearing compression stockings during the day to help with symptoms.  ?  ?Recommend checking BP/HR/weight once per day and keeping a log over the weekend. Please call us early next week to report these numbers so we can reassess if any medication changes need made. Continue Lasix '40mg'$  BID over the weekend. ?  ?May benefit from sooner follow up with Dr. Harrell Gave in the next 2-4 weeks if he is interested. ?  ?Loel Dubonnet, NP" ?

## 2021-08-26 NOTE — Telephone Encounter (Signed)
Low suspicion Carvedilol contributory to edema. However, would be reasonable to stop Carvedilol and resume Metoprolol Tartrate '25mg'$  BID.  ?Ensure drinking less than 2L (64 oz) fluid per day, following low salt diet, elevating legs when sitting, and wearing compression stockings during the day to help with symptoms.  ? ?Recommend checking BP/HR/weight once per day and keeping a log over the weekend. Please call us early next week to report these numbers so we can reassess if any medication changes need made. Continue Lasix '40mg'$  BID over the weekend. ? ?May benefit from sooner follow up with Dr. Harrell Gave in the next 2-4 weeks if he is interested. ? ?Loel Dubonnet, NP  ?

## 2021-08-26 NOTE — Telephone Encounter (Signed)
Pt c/o medication issue: ? ?1. Name of Medication:  ? carvedilol (COREG) 6.25 MG tablet  ? ? ?2. How are you currently taking this medication (dosage and times per day)? Take 1 tablet (6.25 mg total) by mouth 2 (two) times daily. ? ?3. Are you having a reaction (difficulty breathing--STAT)? Yes - breathing up and down ? ?4. What is your medication issue? Legs and feet have been swelling ?

## 2021-08-29 ENCOUNTER — Encounter (HOSPITAL_BASED_OUTPATIENT_CLINIC_OR_DEPARTMENT_OTHER): Payer: Self-pay

## 2021-09-06 ENCOUNTER — Telehealth: Payer: Self-pay | Admitting: Oncology

## 2021-09-06 NOTE — Telephone Encounter (Signed)
Called patient multiple times, voicemail is always full. Calender will be mailed.  ?

## 2021-09-12 ENCOUNTER — Ambulatory Visit (INDEPENDENT_AMBULATORY_CARE_PROVIDER_SITE_OTHER): Payer: Medicare Other | Admitting: *Deleted

## 2021-09-12 DIAGNOSIS — E1136 Type 2 diabetes mellitus with diabetic cataract: Secondary | ICD-10-CM

## 2021-09-12 DIAGNOSIS — I5032 Chronic diastolic (congestive) heart failure: Secondary | ICD-10-CM

## 2021-09-12 NOTE — Patient Instructions (Signed)
Visit Information ? ?Juanda Crumble and Amo, thank you for taking time to talk with me today. Please don't hesitate to contact me if I can be of assistance to you before our next scheduled telephone appointment ? ?Below are the goals we discussed today:  ?Patient Self-Care Activities: ?Patient Antonio Adams will: ?Take medications as prescribed   ?Attend all scheduled provider appointments  ?Call pharmacy for medication refills  ?Call provider office for new concerns or questions  ?Please start checking your weight every day in the morning and write these values down on paper- we will review these values during each of our phone calls ?If you gain 3-4 lbs overnight please contact your cardiologist to get instruction as to what you should do-- overnight weight gain is the earliest indicator that you are retaining fluid; weight gain usually occurs before shortness of breath or swelling does-- so taking action early can prevent you from getting overloaded with fluid ?Read over the attached information about monitoring your condition every day ?Continue to check blood sugars at home every day first thing in the morning before you eat; write the values down in your notebook: we will review during each of our telephone calls ?Take the blood sugar log to all doctor visits- this will help your doctor know which medications you should be on and how your medications are working  ?Continue your efforts to follow a low sugar, low-carbohydrate, heart healthy, low salt diet ?Continue to occasionally check blood pressures at home and write them down on paper  ?Keep up the great work to prevent falls; continue to use your cane as needed ?Stay as active as you can without over-doing activity ? ?Our next scheduled telephone follow up visit/ appointment is scheduled on: Wednesday, Sep 28, 2021 at 11:15 am- This is a PHONE [place] appointment ? ?If you need to cancel or re-schedule our visit, please call [phone] and our care guide team will be  happy to assist you. ?  ?I look forward to hearing about your progress. ?  ?Oneta Rack, RN, BSN, CCRN Alumnus ?[place] ?([phone]: direct office ? ?If you are experiencing a Mental Health or [place] or need someone to talk to, please  ?call the Suicide and Crisis Lifeline: 988 ?call the [place] National Suicide Prevention Lifeline: 838-325-5453 or TTY: 276-643-7750 TTY (618) 666-7142) to talk to a trained counselor ?call 1-800-273-TALK (toll free, 24 hour hotline) ?go to [facility] [address] [place] [phone]) ?call 911  ? ?The patient verbalized understanding of instructions, educational materials, and care plan provided today and agreed to receive a mailed copy of patient instructions, educational materials, and care plan ? ?Heart Failure, Self-Care ?Heart failure is a serious condition. The following information explains the things you need to do to take care of yourself after a heart failure diagnosis. You may be asked to change your diet, take certain medicines, and make other lifestyle changes in order to stay as healthy as possible. Your health care provider may also give you more specific instructions. If you have problems or questions, contact your health care provider. ?What are the risks? ?Having heart failure puts you at higher risk for certain problems. These problems can get worse if you do not take good care of yourself. Problems may include: ?Damage to the kidneys, liver, or lungs. ?Malnutrition. ?Abnormal heart rhythms. ?Blood clotting issues that could cause a stroke. ?Supplies needed: ?Scale for monitoring weight. ?Blood pressure monitor. ?  Notebook. ?Medicines. ?How to care for yourself when you have heart failure ?Medicines ?Take over-the-counter and prescription medicines only as told by your health care provider. Medicines reduce the workload of your heart, slow the  progression of heart failure, and improve symptoms. Take your medicines every day. ?Do not stop taking your medicine unless your health care provider tells you to do so. ?Do not skip any dose of medicine. ?Refill your prescriptions before you run out of medicine. ?Talk with your health care provider if you cannot afford your medicines. ?Eating and drinking ? ?Eat heart-healthy foods. Talk with a dietitian to make an eating plan that is right for you. ?Limit salt (sodium) if told by your health care provider. Sodium restriction may reduce symptoms of heart failure. Ask a dietitian to recommend heart-healthy seasonings. ?Use healthy cooking methods instead of frying. Healthy methods include roasting, grilling, broiling, baking, poaching, steaming, and stir-frying. ?Choose foods that contain no trans fat and are low in saturated fat and cholesterol. Healthy choices include fresh or frozen fruits and vegetables, fish, lean meats, legumes, fat-free or low-fat dairy products, and whole-grain or high-fiber foods. ?Limit your fluid intake, if directed by your health care provider. Fluid restriction may reduce symptoms of heart failure. ?Alcohol use ?Do not drink alcohol if: ?Your health care provider tells you not to drink. ?Your heart was damaged by alcohol, or you have severe heart failure. ?You are pregnant, may be pregnant, or are planning to become pregnant. ?If you drink alcohol: ?Limit how much you have to: ?0-1 drink a day for women. ?0-2 drinks a day for men. ?Know how much alcohol is in your drink. In the [place] one drink equals one 12 oz bottle of beer (355 mL), one 5 oz glass of wine (148 mL), or one 1? oz glass of hard liquor (44 mL). ?Lifestyle ? ?Do not use any products that contain nicotine or tobacco. These products include cigarettes, chewing tobacco, and vaping devices, such as e-cigarettes. If you need help quitting, ask your health care provider. ?Do not use nicotine gum or patches before talking to your  health care provider. ?Do not use illegal drugs. ?Work with your health care provider to safely reach the right body weight. ?Do physical activity if told by your health care provider. Talk to your health care provider before you begin an exercise if: ?You are an older adult. ?You have severe heart failure. ?Learn to manage stress. If you need help to do this, ask your health care provider. ?Participate in or seek physical rehabilitation as needed to keep or improve your independence and quality of life. ?Participate in a cardiac rehabilitation program, which is a treatment program to improve your health and well-being through exercise training, education, and counseling. ?Plan rest periods when you get tired. ?Monitoring important information ? ?Weigh yourself every day. This will help you to notice if too much fluid is building up in your body. ?Weigh yourself every morning after you urinate and before you eat breakfast. ?Wear the same amount of clothing each time you weigh yourself. ?Record your daily weight. Provide your health care provider with your weight record. ?Monitor and record your pulse and blood pressure as told by your health care provider. ?Dealing with extreme temperatures ?If the weather is extremely hot: ?Avoid vigorous physical activity. ?Use air conditioning or fans, or find a cooler location. ?Avoid caffeine and alcohol. ?Wear loose-fitting, lightweight, and light-colored clothing. ?If the weather is extremely cold: ?Avoid vigorous activity. ?Layer your clothes. ?Wear  mittens or gloves, a hat, and a face covering when you go outside. ?Avoid alcohol. ?Follow these instructions at home: ?Stay up to date with vaccines. Pneumococcal and flu (influenza) vaccines are especially important in preventing infections of the airways. ?Keep all follow-up visits. This is important. ?Contact a health care provider if you: ?Gain 2-3 lb (1-1.4 kg) in 24 hours or 5 lb (2.3 kg) in a week. ?Have increasing  shortness of breath. ?Are unable to participate in your usual physical activities. ?Get tired easily. ?Cough more than normal, especially with physical activity. ?Lose your appetite or feel nauseous. ?Have any s

## 2021-09-12 NOTE — Chronic Care Management (AMB) (Signed)
?Chronic Care Management  ? ?CCM RN Visit Note ? ?09/12/2021 ?Name: Antonio Adams MRN: [id number] DOB: 11-Aug-1945 ? ?Subjective: ?Antonio Adams is a 76 y.o. year old male who is a primary care patient of [name], MD. The care management team was consulted for assistance with disease management and care coordination needs.   ? ?Engaged with patient and his fiancee, Antonio Adams by telephone for follow up visit in response to provider referral for case management and/or care coordination services.  Today, patient provides verbal consent to talk with his fiancee, "now or anytime in the future" ? ?Consent to Services:  ?The patient was given information about Chronic Care Management services, agreed to services, and gave verbal consent prior to initiation of services.  Please see initial visit note for detailed documentation.  ?Patient agreed to services and verbal consent obtained.  ? ?Assessment: Review of patient past medical history, allergies, medications, health status, including review of consultants reports, laboratory and other test data, was performed as part of comprehensive evaluation and provision of chronic care management services.  ? ?SDOH (Social Determinants of Health) assessments and interventions performed:  ?SDOH Interventions   ? ?Flowsheet Row Most Recent Value  ?SDOH Interventions   ?Food Insecurity Interventions Intervention Not Indicated  [continues to deny food insecurity]  ?Housing Interventions Intervention Not Indicated  [reports currently resides with fiancee]  ?Transportation Interventions Intervention Not Indicated  [Patient continues to drive self]  ? ?  ?CCM Care Plan ? ?Allergies  ?Allergen Reactions  ? Ace Inhibitors Swelling  ?  lisinopril  ? Xyzal [Levocetirizine] Other (See Comments)  ?  Break out  ? Opdivo [Nivolumab] Rash  ? ?Outpatient Encounter Medications as of 09/12/2021  ?Medication Sig  ? Apoaequorin (PREVAGEN EXTRA STRENGTH PO) Take 1 tablet by mouth daily.  ?  atorvastatin (LIPITOR) 20 MG tablet Take 1 tablet (20 mg total) by mouth at bedtime.  ? clotrimazole-betamethasone (LOTRISONE) cream Apply topically 2 (two) times daily.  ? ferrous sulfate 325 (65 FE) MG tablet Take 1 tablet (325 mg total) by mouth 2 (two) times daily. Patient needs office visit for further refills (Patient taking differently: Take 325 mg by mouth 2 (two) times daily.)  ? furosemide (LASIX) 40 MG tablet Take 1 tablet (40 mg total) by mouth 2 (two) times daily.  ? gabapentin (NEURONTIN) 100 MG capsule Take 100 mg by mouth at bedtime.  ? gemfibrozil (LOPID) 600 MG tablet Take 600 mg by mouth 2 (two) times daily before a meal.  ? Iron-Vitamins (GERITOL) LIQD Take 10 mLs by mouth daily.  ? LOKELMA 10 g PACK packet Take 10 g by mouth daily.  ? megestrol (MEGACE) 40 MG/ML suspension TAKE 10 MLS (400 MG TOTAL) BY MOUTH DAILY.  ? metoprolol tartrate (LOPRESSOR) 25 MG tablet Take 1 tablet (25 mg total) by mouth 2 (two) times daily.  ? multivitamin (RENA-VIT) TABS tablet Take 1 tablet by mouth at bedtime.  ? Omega-3 Fatty Acids (FISH OIL PO) Take 1 tablet by mouth daily.  ? OVER THE COUNTER MEDICATION Take 1 tablet by mouth daily. Focus factor  ? polyvinyl alcohol (LIQUIFILM TEARS) 1.4 % ophthalmic solution Place 1 drop into both eyes daily as needed for dry eyes.  ? solifenacin (VESICARE) 5 MG tablet TAKE 1 TABLET (5 MG TOTAL) BY MOUTH DAILY.  ? tamsulosin (FLOMAX) 0.4 MG CAPS capsule TAKE 1 CAPSULE (0.4 MG TOTAL) BY MOUTH DAILY. (Patient taking differently: Take 0.4 mg by mouth daily.)  ? ?No facility-administered encounter  medications on file as of 09/12/2021.  ? ?Patient Active Problem List  ? Diagnosis Date Noted  ? Chronic heart failure with preserved ejection fraction (HFpEF) ([place]) 08/12/2021  ? Tinea cruris 08/12/2021  ? Hyperkalemia 07/20/2021  ? Anemia due to chronic kidney disease 07/20/2021  ? CKD (chronic kidney disease) stage 5, GFR less than 15 ml/min ([facility]) 05/24/2021  ? BPH (benign prostatic  hyperplasia) 05/24/2021  ? Dental caries on smooth surface penetrating into pulp 05/20/2021  ? Diabetic renal disease ([place]) 05/20/2021  ? Dry eye syndrome of bilateral lacrimal glands 05/20/2021  ? Osteoarthrosis 05/20/2021  ? Senile nuclear sclerosis 05/20/2021  ? Vitamin D deficiency 05/20/2021  ? Vitreous degeneration, unspecified eye 05/20/2021  ? Intrinsic eczema 04/18/2021  ? Atherosclerosis of aorta ([place]) 12/30/2020  ? Mixed conductive and sensorineural hearing loss of left ear with restricted hearing of right ear 09/01/2020  ? Tinnitus of both ears 05/20/2020  ? Metastatic renal cell carcinoma ([place]) 02/26/2020  ? Diabetic polyneuropathy associated with type 2 diabetes mellitus ([place]) 12/18/2019  ? Murmur, cardiac 10/08/2018  ? DDD (degenerative disc disease), cervical 10/08/2018  ? OAB (overactive bladder) 04/04/2018  ? Seasonal allergic rhinitis due to pollen 11/01/2016  ? Microalbuminuria due to type 2 diabetes mellitus ([place]) 07/27/2016  ? Varicose veins of both lower extremities 08/06/2015  ? Varicose veins of lower extremities with ulcer ([place]) 06/16/2015  ? Internal hemorrhoid, bleeding 08/03/2014  ? Post traumatic stress disorder (PTSD) 01/29/2014  ? Spinal stenosis of lumbar region with radiculopathy 10/30/2011  ?  Class: Chronic  ? Obstructive sleep apnea 11/24/2010  ? Obesity 11/24/2010  ? Routine general medical examination at a health care facility 11/24/2010  ? ADENOCARCINOMA, PROSTATE, GLEASON GRADE 6 12/21/2009  ? Type 2 diabetes mellitus with diabetic cataract ([place]) 03/16/2007  ? Essential hypertension 03/16/2007  ? GERD 03/16/2007  ? ?Conditions to be addressed/monitored:   CHF, HTN, and DMII ? ?Care Plan : RN Care Manager Plan of Care  ?Updates made by Knox Royalty, RN since 09/12/2021 12:00 AM  ?  ? ?Problem: Chronic Disease Management Needs   ?Priority: Medium  ?  ? ?Long-Range Goal: Ongoing adherence to established plan of care for long term chronic disease management   ?Start Date:  02/07/2021  ?Expected End Date: 02/07/2022  ?Priority: Medium  ?Note:   ?Current Barriers:  ?Chronic Disease Management support and education needs related to HTN and DMII ?Hospitalization January 9-15, 2023 for dehydration due to N/V/D: discharged with home health services through [facility] ?Hospitalization March 8-10, 2023 for hyperkalemia; patient diagnosed with CHF during this visit; was discharged home to self-care ? ?RNCM Clinical Goal(s): Care plan goals re-established and extended 02/07/21: ?Patient will demonstrate ongoing health management independence for HTN, DMII  through collaboration with RN Care manager, provider, and care team.  ? ?Interventions: ?1:1 collaboration with primary care provider regarding development and update of comprehensive plan of care as evidenced by provider attestation and co-signature ?Inter-disciplinary care team collaboration (see longitudinal plan of care) ?Evaluation of current treatment plan related to  self management and patient's adherence to plan as established by provider ?Review of patient status, including review of consultants reports, relevant laboratory and other test results, and medications completed ?SDOH updated: no new/ unmet concerns identified ?Depression screening updated: no concerns identified ?Pain assessment updated: denies pain ?Falls assessment updated: continues to deny new/ recent falls x 12 months- continues using cane as/if indicated/ needed;  positive reinforcement provided with encouragement to continue efforts at fall prevention;  previously provided education around fall risks/ prevention reinforced ?Reviewed recent hospitalization with patient: he reports "doing really great" since hospital discharge on 07/22/21- denies clinical concerns, issues/ problems; sounds to be in no distress throughout call today ?Medications discussed:  reports continues to independently self-manage and denies current concerns/ issues/ questions around medications;  endorses adherence to taking all medications as prescribed ?Discussed recent medication changes made during provider office visits; patient and his Gardiner Barefoot report that patient is currently taking diuretic (L

## 2021-09-16 ENCOUNTER — Other Ambulatory Visit: Payer: Self-pay | Admitting: Oncology

## 2021-09-21 DIAGNOSIS — E1122 Type 2 diabetes mellitus with diabetic chronic kidney disease: Secondary | ICD-10-CM | POA: Diagnosis not present

## 2021-09-21 DIAGNOSIS — I129 Hypertensive chronic kidney disease with stage 1 through stage 4 chronic kidney disease, or unspecified chronic kidney disease: Secondary | ICD-10-CM | POA: Diagnosis not present

## 2021-09-21 DIAGNOSIS — D509 Iron deficiency anemia, unspecified: Secondary | ICD-10-CM | POA: Diagnosis not present

## 2021-09-21 DIAGNOSIS — C649 Malignant neoplasm of unspecified kidney, except renal pelvis: Secondary | ICD-10-CM | POA: Diagnosis not present

## 2021-09-21 DIAGNOSIS — N184 Chronic kidney disease, stage 4 (severe): Secondary | ICD-10-CM | POA: Diagnosis not present

## 2021-09-21 DIAGNOSIS — N3281 Overactive bladder: Secondary | ICD-10-CM | POA: Diagnosis not present

## 2021-09-21 DIAGNOSIS — E875 Hyperkalemia: Secondary | ICD-10-CM | POA: Diagnosis not present

## 2021-09-21 DIAGNOSIS — N2581 Secondary hyperparathyroidism of renal origin: Secondary | ICD-10-CM | POA: Diagnosis not present

## 2021-09-22 ENCOUNTER — Emergency Department (HOSPITAL_COMMUNITY)
Admission: EM | Admit: 2021-09-22 | Discharge: 2021-09-23 | Disposition: A | Discharge status: Home / Self Care | Payer: Medicare Other | Attending: Emergency Medicine | Admitting: Emergency Medicine

## 2021-09-22 ENCOUNTER — Encounter (HOSPITAL_COMMUNITY): Payer: Self-pay | Admitting: Emergency Medicine

## 2021-09-22 ENCOUNTER — Other Ambulatory Visit: Payer: Self-pay

## 2021-09-22 DIAGNOSIS — N189 Chronic kidney disease, unspecified: Secondary | ICD-10-CM | POA: Insufficient documentation

## 2021-09-22 DIAGNOSIS — D649 Anemia, unspecified: Secondary | ICD-10-CM | POA: Insufficient documentation

## 2021-09-22 DIAGNOSIS — Z85528 Personal history of other malignant neoplasm of kidney: Secondary | ICD-10-CM | POA: Diagnosis not present

## 2021-09-22 DIAGNOSIS — R799 Abnormal finding of blood chemistry, unspecified: Secondary | ICD-10-CM | POA: Diagnosis present

## 2021-09-22 LAB — CBC WITH DIFFERENTIAL/PLATELET
Abs Immature Granulocytes: 0.01 10*3/uL (ref 0.00–0.07)
Basophils Absolute: 0 10*3/uL (ref 0.0–0.1)
Basophils Relative: 1 %
Eosinophils Absolute: 0.1 10*3/uL (ref 0.0–0.5)
Eosinophils Relative: 3 %
HCT: 20.3 % — ABNORMAL LOW (ref 39.0–52.0)
Hemoglobin: 6.6 g/dL — CL (ref 13.0–17.0)
Immature Granulocytes: 0 %
Lymphocytes Relative: 18 %
Lymphs Abs: 0.6 10*3/uL — ABNORMAL LOW (ref 0.7–4.0)
MCH: 31.3 pg (ref 26.0–34.0)
MCHC: 32.5 g/dL (ref 30.0–36.0)
MCV: 96.2 fL (ref 80.0–100.0)
Monocytes Absolute: 0.5 10*3/uL (ref 0.1–1.0)
Monocytes Relative: 17 %
Neutro Abs: 1.9 10*3/uL (ref 1.7–7.7)
Neutrophils Relative %: 61 %
Platelets: 197 10*3/uL (ref 150–400)
RBC: 2.11 MIL/uL — ABNORMAL LOW (ref 4.22–5.81)
RDW: 16.3 % — ABNORMAL HIGH (ref 11.5–15.5)
WBC: 3.2 10*3/uL — ABNORMAL LOW (ref 4.0–10.5)
nRBC: 0 % (ref 0.0–0.2)

## 2021-09-22 LAB — BASIC METABOLIC PANEL
Anion gap: 9 (ref 5–15)
BUN: 57 mg/dL — ABNORMAL HIGH (ref 8–23)
CO2: 14 mmol/L — ABNORMAL LOW (ref 22–32)
Calcium: 9.3 mg/dL (ref 8.9–10.3)
Chloride: 116 mmol/L — ABNORMAL HIGH (ref 98–111)
Creatinine, Ser: 5.33 mg/dL — ABNORMAL HIGH (ref 0.61–1.24)
GFR, Estimated: 11 mL/min — ABNORMAL LOW (ref >60)
Glucose, Bld: 85 mg/dL (ref 70–99)
Potassium: 4.7 mmol/L (ref 3.5–5.1)
Sodium: 139 mmol/L (ref 135–145)

## 2021-09-22 MED ORDER — SODIUM CHLORIDE 0.9 % IV SOLN
10.0000 mL/h | Freq: Once | INTRAVENOUS | Status: AC
  Administered 2021-09-23: 10 mL/h via INTRAVENOUS

## 2021-09-22 NOTE — ED Triage Notes (Signed)
Pt sent by physician for reported hgb of 6.6.  Hx of same and pt and wife state no source of bleeding could be identified on previous admission. No blood thinners.  No dark stools. No complaints of pain or discomfort.  ?

## 2021-09-22 NOTE — ED Provider Triage Note (Signed)
Emergency Medicine Provider Triage Evaluation Note ? ?Antonio Adams , a 76 y.o. male  was evaluated in triage.  Pt complains of low hemoglobin.  Patient received a call from PCP requested he come to the ER for evaluation due to low hemoglobin.  Has history of the same.  Unsure of the cause of his anemia.  Denies abdominal pain, chest pain, shortness of breath, fatigue, or melena.  States he feels well.  Patient required transfusion during the recent hospitalization.  Unsure of reasoning of his anemia. ? ?Review of Systems  ?Positive: As above ?Negative: As above ? ?Physical Exam  ?BP (!) 152/93 (BP Location: Right Arm)   Pulse 86   Temp 99 ?F (37.2 ?C) (Oral)   Resp 16   SpO2 100%  ?Gen:   Awake, no distress   ?Resp:  Normal effort  ?MSK:   Moves extremities without difficulty  ?Other:  Abdomen nontender and nondistended. ? ?Medical Decision Making  ?Medically screening exam initiated at 7:09 PM.  Appropriate orders placed.  Antonio Adams was informed that the remainder of the evaluation will be completed by another provider, this initial triage assessment does not replace that evaluation, and the importance of remaining in the ED until their evaluation is complete. ? ? ?  ?Evlyn Courier, PA-C ?09/22/21 1910 ? ?

## 2021-09-22 NOTE — ED Provider Notes (Signed)
?[facility] ?Provider Note ? ? ?CSN: [id number] ?Arrival date & time: 09/22/21  Antonio Adams ? ?  ? ?History ? ?Chief Complaint  ?Patient presents with  ? Abnormal labs  ? ? ?Antonio Adams is a 76 y.o. male. ? ?76 year old male who presents to the ER today secondary to an abnormal hemoglobin at his doctor's office.  Patient has a history of chronic kidney disease and refuses dialysis.  Also has a history of renal cell carcinoma status post resection couple years ago.  Patient states he feels fine does not have any fatigue, weakness, shortness of breath or other symptoms.  No skin color changes.  States that he had routine labs drawn at his doctor's office and they called and told him to come to the ER because his hemoglobin was below 7.  No other complaints. ? ? ? ?  ? ?Home Medications ?Prior to Admission medications   ?Medication Sig Start Date End Date Taking? Authorizing Provider  ?Apoaequorin (PREVAGEN EXTRA STRENGTH PO) Take 1 tablet by mouth daily.   Yes [provider]  ?atorvastatin (LIPITOR) 20 MG tablet Take 1 tablet (20 mg total) by mouth at bedtime. 05/29/21 09/23/21 Yes Kayleen Memos, DO  ?ferrous sulfate 325 (65 FE) MG tablet Take 1 tablet (325 mg total) by mouth 2 (two) times daily. Patient needs office visit for further refills ?Patient taking differently: Take 325 mg by mouth 2 (two) times daily. 02/24/19  Yes Irene Shipper, MD  ?furosemide (LASIX) 40 MG tablet Take 1 tablet (40 mg total) by mouth 2 (two) times daily. 08/12/21  Yes [name], MD  ?gabapentin (NEURONTIN) 100 MG capsule Take 100 mg by mouth at bedtime.   Yes [provider]  ?gemfibrozil (LOPID) 600 MG tablet Take 600 mg by mouth 2 (two) times daily before a meal.   Yes [provider]  ?Iron-Vitamins (GERITOL) LIQD Take 10 mLs by mouth daily.   Yes [provider]  ?LOKELMA 10 g PACK packet Take 10 g by mouth daily. 05/04/21  Yes [provider]   ?megestrol (MEGACE) 40 MG/ML suspension TAKE 10 MLS (400 MG TOTAL) BY MOUTH DAILY. ?Patient taking differently: Take 400 mg by mouth daily. 09/16/21  Yes Wyatt Portela, MD  ?metoprolol tartrate (LOPRESSOR) 25 MG tablet Take 1 tablet (25 mg total) by mouth 2 (two) times daily. 08/26/21 11/24/21 Yes Loel Dubonnet, NP  ?multivitamin (RENA-VIT) TABS tablet Take 1 tablet by mouth at bedtime. 07/22/21 10/20/21 Yes Kayleen Memos, DO  ?Omega-3 Fatty Acids (FISH OIL PO) Take 1 tablet by mouth daily.   Yes [provider]  ?OVER THE COUNTER MEDICATION Take 1 tablet by mouth daily. Focus factor   Yes [provider]  ?polyvinyl alcohol (LIQUIFILM TEARS) 1.4 % ophthalmic solution Place 1 drop into both eyes daily as needed for dry eyes.   Yes [provider]  ?solifenacin (VESICARE) 5 MG tablet TAKE 1 TABLET (5 MG TOTAL) BY MOUTH DAILY. 06/02/21  Yes [name], MD  ?tamsulosin (FLOMAX) 0.4 MG CAPS capsule TAKE 1 CAPSULE (0.4 MG TOTAL) BY MOUTH DAILY. ?Patient taking differently: Take 0.4 mg by mouth daily. 01/20/14  Yes [name], MD  ?   ? ?Allergies    ?Ace inhibitors, Xyzal [levocetirizine], and Opdivo [nivolumab]   ? ?Review of Systems   ?Review of Systems ? ?Physical Exam ?Updated Vital Signs ?BP (!) 122/96   Pulse 66   Temp (!) 97.5 ?  F (36.4 ?C) (Oral)   Resp 16   SpO2 100%  ?Physical Exam ?Vitals and nursing note reviewed.  ?Constitutional:   ?   Appearance: He is well-developed.  ?HENT:  ?   Head: Normocephalic and atraumatic.  ?   Mouth/Throat:  ?   Mouth: Mucous membranes are moist.  ?   Pharynx: Oropharynx is clear.  ?Eyes:  ?   Pupils: Pupils are equal, round, and reactive to light.  ?Cardiovascular:  ?   Rate and Rhythm: Normal rate.  ?Pulmonary:  ?   Effort: Pulmonary effort is normal. No respiratory distress.  ?Abdominal:  ?   General: Abdomen is flat. There is no distension.  ?Musculoskeletal:     ?   General: Normal range of motion.  ?   Cervical back: Normal range of  motion.  ?Skin: ?   General: Skin is warm and dry.  ?Neurological:  ?   General: No focal deficit present.  ?   Mental Status: He is alert.  ? ? ?ED Results / Procedures / Treatments   ?Labs ?(all labs ordered are listed, but only abnormal results are displayed) ?Labs Reviewed  ?CBC WITH DIFFERENTIAL/PLATELET - Abnormal; Notable for the following components:  ?    Result Value  ? WBC 3.2 (*)   ? RBC 2.11 (*)   ? Hemoglobin 6.6 (*)   ? HCT 20.3 (*)   ? RDW 16.3 (*)   ? Lymphs Abs 0.6 (*)   ? All other components within normal limits  ?BASIC METABOLIC PANEL - Abnormal; Notable for the following components:  ? Chloride 116 (*)   ? CO2 14 (*)   ? BUN 57 (*)   ? Creatinine, Ser 5.33 (*)   ? GFR, Estimated 11 (*)   ? All other components within normal limits  ?PREPARE RBC (CROSSMATCH)  ?TYPE AND SCREEN  ? ? ?EKG ?None ? ?Radiology ?No results found. ? ?Procedures ?Procedures  ? ? ?Medications Ordered in ED ?Medications  ?0.9 %  sodium chloride infusion (0 mL/hr Intravenous Stopped 09/23/21 0658)  ? ? ?ED Course/ Medical Decision Making/ A&P ?  ?                        ?Medical Decision Making ?Amount and/or Complexity of Data Reviewed ?Labs: ordered. ? ?Risk ?Prescription drug management. ? ? ?Anemia likely related to CKD. Doesn't want dialysis. Will plan for transfusion here and fu w/ oncologist.  ?No reactions. Still feels fine. Stable for d/c.  ? ?Final Clinical Impression(s) / ED Diagnoses ?Final diagnoses:  ?Anemia, unspecified type  ? ? ?Rx / DC Orders ?ED Discharge Orders   ? ? None  ? ?  ? ? ?  ?Merrily Pew, MD ?09/24/21 [phone] ? ?

## 2021-09-23 LAB — PREPARE RBC (CROSSMATCH)

## 2021-09-24 LAB — TYPE AND SCREEN
ABO/RH(D): O POS
Antibody Screen: NEGATIVE
Donor AG Type: NEGATIVE
Donor AG Type: NEGATIVE
Unit division: 0
Unit division: 0

## 2021-09-24 LAB — BPAM RBC
Blood Product Expiration Date: 202306062359
Blood Product Expiration Date: 202306062359
ISSUE DATE / TIME: 202305120035
ISSUE DATE / TIME: 202305120405
Unit Type and Rh: 5100
Unit Type and Rh: 5100

## 2021-09-28 ENCOUNTER — Ambulatory Visit: Payer: Medicare Other | Admitting: *Deleted

## 2021-09-28 DIAGNOSIS — I10 Essential (primary) hypertension: Secondary | ICD-10-CM

## 2021-09-28 DIAGNOSIS — E1136 Type 2 diabetes mellitus with diabetic cataract: Secondary | ICD-10-CM

## 2021-09-28 DIAGNOSIS — I5032 Chronic diastolic (congestive) heart failure: Secondary | ICD-10-CM

## 2021-09-29 NOTE — Patient Instructions (Signed)
Visit Antonio Adams, thank you for taking time to talk with me today. Please don't hesitate to contact me if I can be of assistance to you before our next scheduled telephone appointment  Below are the goals we discussed today:  Patient Self-Care Activities: Patient Antonio Adams will: Take medications as prescribed   Attend all scheduled provider appointments  Call pharmacy for medication refills  Call provider office for new concerns or questions  Continue checking your weight every day in the morning and write these values down on paper- we will review these values during each of our phone calls If you gain 3-4 lbs overnight please contact your cardiologist to get instruction as to what you should do-- overnight weight gain is the earliest indicator that you are retaining fluid; weight gain usually occurs before shortness of breath or swelling does-- so taking action early can prevent you from getting overloaded with fluid, and possibly having to go to the hospital Continue to check blood sugars at home every day first thing in the morning before you eat; write the values down in your notebook: we will review during each of our telephone calls Take the blood sugar log to all doctor visits- this will help your doctor know which medications you should be on and how your medications are working  Continue your efforts to follow a low sugar, low-carbohydrate, heart healthy, low salt diet Continue to occasionally check blood pressures at home and write them down on paper  Keep up the great work to prevent falls; continue to use your cane as needed Stay as active as you can without over-doing activity  Our next scheduled telephone follow up visit/ appointment is scheduled on: Wednesday, November 02, 2021 at 10:45 am- This is a PHONE [place] appointment  If you need to cancel or re-schedule our visit, please call [phone] and our care guide team will be happy to assist you.   I look forward to  hearing about your progress.   Antonio Rack, RN, BSN, [place] [phone]: direct office  If you are experiencing a Mental Health or [place] or need someone to talk to, please  call the Suicide and Crisis Lifeline: 988 call the [place] National Suicide Prevention Lifeline: (647)517-7893 or TTY: 412-425-0529 TTY 604-749-8520) to talk to a trained counselor call 1-800-273-TALK (toll free, 24 hour hotline) go to [facility] [address] [place] [phone]) call 911   The patient verbalized understanding of instructions, educational materials, and care plan provided today and agreed to receive a mailed copy of patient instructions, educational materials, and care plan  Living With Heart Failure Heart failure is a long-term (chronic) condition in which the heart cannot pump enough blood through the body. When this happens, parts of the body do not get the blood and oxygen they need. There is no cure for heart failure at this time, so it is important for you to take good care of yourself and follow the treatment plan you set with your health care provider. If you are living with heart failure, there are ways to help you manage the disease. How to manage lifestyle changes Living with heart failure requires you to make changes in your life. Your health care team will teach you about the changes you need to make in order to relieve your symptoms and lower your risk of going to the hospital. Work with your health care provider to  develop a treatment plan that works for you. Activity Ask your health care provider about attending cardiac rehabilitation. These programs include aerobic physical activity, which provides many benefits for your heart. If no cardiac rehabilitation program is available, ask your health care provider what aerobic exercises are safe for you to do. Return  to your normal activities as told by your health care provider. Ask your health care provider what activities are safe for you. Pace your daily activities and allow time for rest as needed. Managing stress It is normal to have many emotions about your diagnosis, such as fear, sadness, anger, and loss. If you feel any of these emotions and need help coping, contact your health care provider. Here are some ways to help yourself manage these emotions: Talk to friends and family members about your condition. They can give you support and guidance. Explain your symptoms to them and, if comfortable, invite them to attend appointments or rehabilitation with you. Join a support group for people with chronic heart failure. Talking with other people who have the same symptoms may give you new ways of coping with your disease and your emotions. Accept help from others. Do not be ashamed if you need help with certain tasks. Use stress management techniques, such as meditation, breathing exercises, or listening to relaxing music. Conditions such as depression and anxiety are common in persons with heart failure. Pay attention to changes in your mood, emotions, and stress levels. Tell your health care provider if you have any of the following symptoms: Trouble sleeping or a change in your sleeping patterns. Feeling sad, down, or depressed more often than not, every day for more than 2 weeks. Losing interest in activities you normally enjoy. Feeling irritable or crying for no reason. Finding yourself worrying about the future often. Work You may need to develop a plan with your health care provider if heart failure interferes with your ability to work. This may include: Reducing your work hours. Finding functions that are less active or require less effort. Planning rest periods during your work hours. Travel Talk with your health care provider if you plan to travel. There may be circumstances in which your  health care provider recommends that you do not travel or that you delay travel until your condition is under control. When you travel, bring your medicine and a list of your medicines. If you are traveling by air, keep your medicines with you in a carry-on bag. Consider finding a medical facility in the area you will be traveling to and determine what your health insurance will cover. If you will be traveling by public transportation (airplane, train, bus), contact the company prior to traveling if you have special needs. This may include needs related to diet, oxygen, a wheelchair, a seating request, or help with luggage. If you use oxygen, make sure to bring enough oxygen with you. If you have a battery-powered device, bring a fully charged extra battery with you. If you have a device, bring a note from your health care provider and inform all security screening personnel that you have the device. You may need to go through special screening for safety. Sexual activity  Ask your health care provider when it is safe for you to resume sexual activity. You may need to start slowly and gradually increase intimacy. You can increase intimacy by doing such things as caressing, touching, and holding each other. Get regular exercise as told by your health care provider. This can benefit your sex  life by building strength and endurance. Sleep If your condition interferes with your sleep, find ways to improve your sleep quality, such as: Sleep lying on your side, or sleep with your head elevated by raising the head of your bed or using multiple pillows. Ask your health care provider about screening for sleep apnea. Try to go to sleep and wake up at the same times every day. Sleep in a dark, cool room. Do not do any physical activity or eating for a few hours before bedtime. Plan rest periods during the day, but do not take long naps during the day.  Where to find support Consider talking with: Family  members. Close friends. A mental health professional or therapist. A member of your church, faith, or community group. Other sources of support include: Local support groups. Ask your health care provider about groups near you. Online support groups, such as those found through the American Heart Association: supportnetwork.heart.org Local home care agencies, community agencies, or social agencies. A palliative care specialist. Palliative care can help you manage symptoms, promote comfort, improve quality of life, and maintain dignity. Where to find more information American Heart Association: heart.org National Heart, Lung, and Blood Institute: https://www.hartman-hill.biz/ Centers for Disease Control and Prevention: https://www.reeves.com/ [place]: SolutionApps.it Contact a health care provider if: You have a rapid weight gain. You have increasing shortness of breath that is unusual for you. You are unable to participate in your usual physical activities. You tire easily. You have difficulty sleeping, such as: You wake up feeling short of breath. You have to use more pillows to raise your head in order to sleep. You cough more than normal, especially with physical activity. You have any swelling or more swelling in areas such as your hands, feet, ankles, or abdomen. You become dizzy or light-headed when you stand up. You have changes to your appetite. You have symptoms of depression or anxiety. Get help right away if: You have difficulty breathing. You notice or your family notices a change in your awareness, such as having trouble staying awake or having difficulty with concentration. You have pain or discomfort in your chest. You have an episode of fainting (syncope). You feel like your heart is beating quickly (palpitations). You have extreme feelings of sadness or loss of hope, or you have thoughts about hurting yourself or others. These symptoms may represent a serious  problem that is an emergency. Do not wait to see if the symptoms will go away. Get medical help right away. Call your local emergency services (911 in the [place] Do not drive yourself to the hospital. Summary There is no cure for heart failure, so it is important for you to take good care of yourself and follow the treatment plan set by your health care provider. Ask your health care provider about attending cardiac rehabilitation. These programs include aerobic physical activity, which provides many benefits for your heart. It is normal to have many emotions about your diagnosis, such as fear, sadness, anger, and loss. If you feel any of these emotions and need help coping, contact your health care provider. You may need to develop a plan with your health care provider if heart failure interferes with your ability to work. This information is not intended to replace advice given to you by your health care provider. Make sure you discuss any questions you have with your health care provider. Document Revised: 12/15/2019 Document Reviewed: 12/15/2019 Elsevier Patient Education  [place].

## 2021-09-29 NOTE — Chronic Care Management (AMB) (Signed)
Chronic Care Management   CCM RN Visit Note  09/29/2021 Name: Antonio Adams MRN: [id number] DOB: 1945/09/10  Subjective: Antonio Adams is a 76 y.o. year old male who is a primary care patient of [name], MD. The care management team was consulted for assistance with disease management and care coordination needs.    Engaged with patient by telephone for follow up visit in response to provider referral for case management and/or care coordination services.   Consent to Services:  The patient was given information about Chronic Care Management services, agreed to services, and gave verbal consent prior to initiation of services.  Please see initial visit note for detailed documentation.  Patient agreed to services and verbal consent obtained.   Assessment: Review of patient past medical history, allergies, medications, health status, including review of consultants reports, laboratory and other test data, was performed as part of comprehensive evaluation and provision of chronic care management services.  CCM Care Plan  Allergies  Allergen Reactions   Ace Inhibitors Swelling    lisinopril   Xyzal [Levocetirizine] Other (See Comments)    Break out   Opdivo [Nivolumab] Rash   Outpatient Encounter Medications as of 09/28/2021  Medication Sig   Apoaequorin (PREVAGEN EXTRA STRENGTH PO) Take 1 tablet by mouth daily.   ferrous sulfate 325 (65 FE) MG tablet Take 1 tablet (325 mg total) by mouth 2 (two) times daily. Patient needs office visit for further refills (Patient taking differently: Take 325 mg by mouth 2 (two) times daily.)   furosemide (LASIX) 40 MG tablet Take 1 tablet (40 mg total) by mouth 2 (two) times daily.   gabapentin (NEURONTIN) 100 MG capsule Take 100 mg by mouth at bedtime.   gemfibrozil (LOPID) 600 MG tablet Take 600 mg by mouth 2 (two) times daily before a meal.   Iron-Vitamins (GERITOL) LIQD Take 10 mLs by mouth daily.   LOKELMA 10 g PACK packet Take  10 g by mouth daily.   megestrol (MEGACE) 40 MG/ML suspension TAKE 10 MLS (400 MG TOTAL) BY MOUTH DAILY. (Patient taking differently: Take 400 mg by mouth daily.)   metoprolol tartrate (LOPRESSOR) 25 MG tablet Take 1 tablet (25 mg total) by mouth 2 (two) times daily.   multivitamin (RENA-VIT) TABS tablet Take 1 tablet by mouth at bedtime.   Omega-3 Fatty Acids (FISH OIL PO) Take 1 tablet by mouth daily.   OVER THE COUNTER MEDICATION Take 1 tablet by mouth daily. Focus factor   polyvinyl alcohol (LIQUIFILM TEARS) 1.4 % ophthalmic solution Place 1 drop into both eyes daily as needed for dry eyes.   solifenacin (VESICARE) 5 MG tablet TAKE 1 TABLET (5 MG TOTAL) BY MOUTH DAILY.   tamsulosin (FLOMAX) 0.4 MG CAPS capsule TAKE 1 CAPSULE (0.4 MG TOTAL) BY MOUTH DAILY. (Patient taking differently: Take 0.4 mg by mouth daily.)   No facility-administered encounter medications on file as of 09/28/2021.   Patient Active Problem List   Diagnosis Date Noted   Chronic heart failure with preserved ejection fraction (HFpEF) (Antonio Adams) 08/12/2021   Tinea cruris 08/12/2021   Hyperkalemia 07/20/2021   Anemia due to chronic kidney disease 07/20/2021   CKD (chronic kidney disease) stage 5, GFR less than 15 ml/min (Antonio Adams) 05/24/2021   BPH (benign prostatic hyperplasia) 05/24/2021   Dental caries on smooth surface penetrating into pulp 05/20/2021   Diabetic renal disease ([place]) 05/20/2021   Dry eye syndrome of bilateral lacrimal glands 05/20/2021   Osteoarthrosis 05/20/2021   Senile nuclear  sclerosis 05/20/2021   Vitamin D deficiency 05/20/2021   Vitreous degeneration, unspecified eye 05/20/2021   Intrinsic eczema 04/18/2021   Atherosclerosis of aorta ([place]) 12/30/2020   Mixed conductive and sensorineural hearing loss of left ear with restricted hearing of right ear 09/01/2020   Tinnitus of both ears 05/20/2020   Metastatic renal cell carcinoma ([place]) 02/26/2020   Diabetic polyneuropathy associated with type 2 diabetes  mellitus ([place]) 12/18/2019   Murmur, cardiac 10/08/2018   DDD (degenerative disc disease), cervical 10/08/2018   OAB (overactive bladder) 04/04/2018   Seasonal allergic rhinitis due to pollen 11/01/2016   Microalbuminuria due to type 2 diabetes mellitus ([place]) 07/27/2016   Varicose veins of both lower extremities 08/06/2015   Varicose veins of lower extremities with ulcer ([place]) 06/16/2015   Internal hemorrhoid, bleeding 08/03/2014   Post traumatic stress disorder (PTSD) 01/29/2014   Spinal stenosis of lumbar region with radiculopathy 10/30/2011    Class: Chronic   Obstructive sleep apnea 11/24/2010   Obesity 11/24/2010   Routine general medical examination at a health care facility 11/24/2010   ADENOCARCINOMA, PROSTATE, GLEASON GRADE 6 12/21/2009   Type 2 diabetes mellitus with diabetic cataract ([place]) 03/16/2007   Essential hypertension 03/16/2007   GERD 03/16/2007   Conditions to be addressed/monitored:  CHF, HTN, and DMII  Care Plan : RN Care Manager Plan of Care  Updates made by Antonio Royalty, RN since 09/29/2021 12:00 AM     Problem: Chronic Disease Management Needs   Priority: Medium     Long-Range Goal: Ongoing adherence to established plan of care for long term chronic disease management   Start Date: 02/07/2021  Expected End Date: 02/07/2022  Priority: Medium  Note:   Current Barriers:  Chronic Disease Management support and education needs related to HTN and DMII Hospitalization January 9-15, 2023 for dehydration due to N/V/D: discharged with home health services through [facility] Hospitalization March 8-10, 2023 for hyperkalemia; patient diagnosed with CHF during this visit; was discharged home to self-care  RNCM Clinical Goal(s): Care plan goals re-established and extended 02/07/21: Patient will demonstrate ongoing health management independence for HTN, DMII  through collaboration with RN Care manager, provider, and care team.   Interventions: 1:1 collaboration  with primary care provider regarding development and update of comprehensive plan of care as evidenced by provider attestation and co-signature Inter-disciplinary care team collaboration (see longitudinal plan of care) Evaluation of current treatment plan related to  self management and patient's adherence to plan as established by provider Review of patient status, including review of consultants reports, relevant laboratory and other test results, and medications completed Pain assessment updated: denies pain today Medications discussed: reports continues to independently self-manage and denies current concerns/ issues/ questions around medications; endorses adherence to taking all medications as prescribed Reviewed upcoming scheduled provider appointments: 10/04/21- oncology labs; 10/11/21- oncology provider; patient confirms is aware of all and has plans to attend as scheduled Discussed plans with patient for ongoing care management follow up and provided patient with direct contact information for care management team     Diabetes:  (Status: 09/28/21: Goal on Track (progressing): YES.) Long Term Goal Lab Results  Component Value Date   HGBA1C 5.8 (H) 05/23/2021  Provided education to patient about basic DM disease process; Confirmed no concerns around signs/ symptoms low blood sugars- he is unable to review blood sugars from home monitoring today- he is not at his home/ records; states "they have been fine" Reinforced previously provided education around signs/ symptoms and risks of  low blood sugars along with corresponding action plan for same Confirmed patient continues trying to follow DMII/ heart healthy diet- positive reinforcement provided with encouragement to continue efforts  Heart Failure/ HTN Interventions:  (Status: 09/28/21: Goal on track:  Yes.) Long Term Goal Basic overview and discussion of pathophysiology of Heart Failure reviewed Provided education on low sodium diet Discussed  importance of daily weight and advised patient to weigh and record daily Reviewed role of diuretics in prevention of fluid overload and management of heart failure; Discussed the importance of keeping all appointments with provider  Patient reports he has been monitoring/ recording daily weights at home since our last outreach:  Reinforced previously provided education around general guidelines for action plan for daily and/or weekly weight gain- he is new to HF and will require ongoing reinforcement, support/ education/ reminding Confirmed patient now taking diuretic as prescribed "twice a day" Evaluation of current treatment plan related to hypertension self management and patient's adherence to plan as established by provider;   Discussed complications of poorly controlled blood pressure such as heart disease, stroke, circulatory complications, vision complications, kidney impairment, sexual dysfunction Confirmed patient continues monitoring/ writing down on paper blood pressures at home, weekly-- he is unable to review blood pressures from home monitoring today- he is not at his home/ records; states "they have been fine;" he denies concerns around dizziness/ lightheadedness- states "I feel fine"  Last practice recorded BP readings:  BP Readings from Last 3 Encounters:  08/17/21 (!) 158/82  08/12/21 (!) 166/74  08/03/21 (!) 142/78  Most recent eGFR/CrCl: No results found for: EGFR  No components found for: CRCL  Patient Goals/Self-Care Activities: As evidenced by review of EHR, collaboration with care team, and patient reporting during CCM RN CM outreach,  Patient Antonio Adams will: Take medications as prescribed   Attend all scheduled provider appointments  Call pharmacy for medication refills  Call provider office for new concerns or questions  Continue checking your weight every day in the morning and write these values down on paper- we will review these values during each of our phone  calls If you gain 3-4 lbs overnight please contact your cardiologist to get instruction as to what you should do-- overnight weight gain is the earliest indicator that you are retaining fluid; weight gain usually occurs before shortness of breath or swelling does-- so taking action early can prevent you from getting overloaded with fluid, and possibly having to go to the hospital Continue to check blood sugars at home every day first thing in the morning before you eat; write the values down in your notebook: we will review during each of our telephone calls Take the blood sugar log to all doctor visits- this will help your doctor know which medications you should be on and how your medications are working  Continue your efforts to follow a low sugar, low-carbohydrate, heart healthy, low salt diet Continue to occasionally check blood pressures at home and write them down on paper  Keep up the great work to prevent falls; continue to use your cane as needed Stay as active as you can without over-doing activity     Plan: Telephone follow up appointment with care management team member scheduled for: Wednesday, November 02, 2021 at 10:45 am The patient has been provided with contact information for the care management team and has been advised to call with any health related questions or concerns  Antonio Rack, RN, BSN, [facility] RN Care Coordination- [facility]  [facility] [phone]: direct office

## 2021-10-04 ENCOUNTER — Inpatient Hospital Stay: Payer: Medicare Other | Attending: Oncology

## 2021-10-04 ENCOUNTER — Telehealth: Payer: Self-pay

## 2021-10-04 ENCOUNTER — Other Ambulatory Visit: Payer: Self-pay

## 2021-10-04 ENCOUNTER — Ambulatory Visit (HOSPITAL_COMMUNITY)
Admission: RE | Admit: 2021-10-04 | Discharge: 2021-10-04 | Disposition: A | Discharge status: Home / Self Care | Payer: Medicare Other | Source: Ambulatory Visit | Attending: Oncology | Admitting: Oncology

## 2021-10-04 DIAGNOSIS — C7951 Secondary malignant neoplasm of bone: Secondary | ICD-10-CM | POA: Diagnosis not present

## 2021-10-04 DIAGNOSIS — I2510 Atherosclerotic heart disease of native coronary artery without angina pectoris: Secondary | ICD-10-CM | POA: Diagnosis not present

## 2021-10-04 DIAGNOSIS — C641 Malignant neoplasm of right kidney, except renal pelvis: Secondary | ICD-10-CM | POA: Insufficient documentation

## 2021-10-04 DIAGNOSIS — C787 Secondary malignant neoplasm of liver and intrahepatic bile duct: Secondary | ICD-10-CM | POA: Diagnosis not present

## 2021-10-04 DIAGNOSIS — Z79899 Other long term (current) drug therapy: Secondary | ICD-10-CM | POA: Insufficient documentation

## 2021-10-04 DIAGNOSIS — M5137 Other intervertebral disc degeneration, lumbosacral region: Secondary | ICD-10-CM | POA: Diagnosis not present

## 2021-10-04 DIAGNOSIS — M47816 Spondylosis without myelopathy or radiculopathy, lumbar region: Secondary | ICD-10-CM | POA: Diagnosis not present

## 2021-10-04 LAB — CBC WITH DIFFERENTIAL (CANCER CENTER ONLY)
Abs Immature Granulocytes: 0.01 10*3/uL (ref 0.00–0.07)
Basophils Absolute: 0 10*3/uL (ref 0.0–0.1)
Basophils Relative: 1 %
Eosinophils Absolute: 0.1 10*3/uL (ref 0.0–0.5)
Eosinophils Relative: 2 %
HCT: 26.1 % — ABNORMAL LOW (ref 39.0–52.0)
Hemoglobin: 8.9 g/dL — ABNORMAL LOW (ref 13.0–17.0)
Immature Granulocytes: 0 %
Lymphocytes Relative: 17 %
Lymphs Abs: 0.8 10*3/uL (ref 0.7–4.0)
MCH: 30.4 pg (ref 26.0–34.0)
MCHC: 34.1 g/dL (ref 30.0–36.0)
MCV: 89.1 fL (ref 80.0–100.0)
Monocytes Absolute: 0.6 10*3/uL (ref 0.1–1.0)
Monocytes Relative: 13 %
Neutro Abs: 3.1 10*3/uL (ref 1.7–7.7)
Neutrophils Relative %: 67 %
Platelet Count: 171 10*3/uL (ref 150–400)
RBC: 2.93 MIL/uL — ABNORMAL LOW (ref 4.22–5.81)
RDW: 15.6 % — ABNORMAL HIGH (ref 11.5–15.5)
WBC Count: 4.6 10*3/uL (ref 4.0–10.5)
nRBC: 0 % (ref 0.0–0.2)

## 2021-10-04 LAB — CMP (CANCER CENTER ONLY)
ALT: 19 U/L (ref 0–44)
AST: 28 U/L (ref 15–41)
Albumin: 4 g/dL (ref 3.5–5.0)
Alkaline Phosphatase: 85 U/L (ref 38–126)
Anion gap: 11 (ref 5–15)
BUN: 68 mg/dL — ABNORMAL HIGH (ref 8–23)
CO2: 16 mmol/L — ABNORMAL LOW (ref 22–32)
Calcium: 9.7 mg/dL (ref 8.9–10.3)
Chloride: 113 mmol/L — ABNORMAL HIGH (ref 98–111)
Creatinine: 5.44 mg/dL (ref 0.61–1.24)
GFR, Estimated: 10 mL/min — ABNORMAL LOW (ref >60)
Glucose, Bld: 92 mg/dL (ref 70–99)
Potassium: 3.9 mmol/L (ref 3.5–5.1)
Sodium: 140 mmol/L (ref 135–145)
Total Bilirubin: 0.3 mg/dL (ref 0.3–1.2)
Total Protein: 7.3 g/dL (ref 6.5–8.1)

## 2021-10-04 LAB — TSH: TSH: 2.577 u[IU]/mL (ref 0.350–4.500)

## 2021-10-04 NOTE — Telephone Encounter (Signed)
CRITICAL VALUE STICKER  CRITICAL VALUE: Creatinine = 5.44  RECEIVER (on-site recipient of call): Yetta Glassman, CMA  DATE & TIME NOTIFIED: 10/04/21 at 10:40am  MESSENGER (representative from lab): Lauren  MD NOTIFIED: Alen Blew  TIME OF NOTIFICATION: 10/04/21 at 10:45am  RESPONSE: Notification provided to Dr. Alen Blew and Charlett Nose, RN for follow-up with the pt.

## 2021-10-11 ENCOUNTER — Telehealth: Payer: Self-pay | Admitting: *Deleted

## 2021-10-11 ENCOUNTER — Inpatient Hospital Stay: Payer: Medicare Other | Admitting: Oncology

## 2021-10-11 NOTE — Telephone Encounter (Signed)
PC to patient regarding missed appointment this a.m., he states he was unaware.  Informed patient scheduling will call him to reschedule appointment, informed patient his voice mailbox has been full in the past, please delete his messages.  He verbalizes understanding, scheduling message sent.

## 2021-10-12 ENCOUNTER — Telehealth: Payer: Self-pay | Admitting: Oncology

## 2021-10-12 ENCOUNTER — Other Ambulatory Visit: Payer: Self-pay | Admitting: Oncology

## 2021-10-12 DIAGNOSIS — E1159 Type 2 diabetes mellitus with other circulatory complications: Secondary | ICD-10-CM

## 2021-10-12 DIAGNOSIS — I110 Hypertensive heart disease with heart failure: Secondary | ICD-10-CM

## 2021-10-12 DIAGNOSIS — I509 Heart failure, unspecified: Secondary | ICD-10-CM

## 2021-10-12 NOTE — Telephone Encounter (Signed)
Per 5/30 sch message called pt to let him about appointment for tomorrow   pt confirmed appointment

## 2021-10-13 ENCOUNTER — Inpatient Hospital Stay: Payer: Medicare Other | Attending: Oncology | Admitting: Oncology

## 2021-10-13 ENCOUNTER — Other Ambulatory Visit: Payer: Self-pay

## 2021-10-13 VITALS — BP 160/77 | HR 70 | Temp 97.8°F | Resp 18 | Ht 71.0 in | Wt 208.0 lb

## 2021-10-13 DIAGNOSIS — C787 Secondary malignant neoplasm of liver and intrahepatic bile duct: Secondary | ICD-10-CM | POA: Insufficient documentation

## 2021-10-13 DIAGNOSIS — C7972 Secondary malignant neoplasm of left adrenal gland: Secondary | ICD-10-CM | POA: Insufficient documentation

## 2021-10-13 DIAGNOSIS — N189 Chronic kidney disease, unspecified: Secondary | ICD-10-CM | POA: Insufficient documentation

## 2021-10-13 DIAGNOSIS — D631 Anemia in chronic kidney disease: Secondary | ICD-10-CM | POA: Diagnosis not present

## 2021-10-13 DIAGNOSIS — C641 Malignant neoplasm of right kidney, except renal pelvis: Secondary | ICD-10-CM | POA: Insufficient documentation

## 2021-10-13 NOTE — Progress Notes (Signed)
Hematology and Oncology Follow Up Visit  Antonio Adams [id number] 03/25/1946 76 y.o. 10/13/2021 9:00 AM [name], MDJones, [name], MD   Principle Diagnosis: 76 year old man with kidney cancer diagnosed in 2017.  He was found to have T1b papillary tumor and subsequently developed stage IV disease with lymphadenopathy.  Prior Therapy:  He underwent right partial nephrectomy and found to have stage T1b 6 cm papillary tumor in 2017.  He completed right adrenalectomy and metastatic resection of a recurrent kidney tumor right retroperitoneum completed in October 2021 and care of Dr. Louis Meckel.  The final pathology at that time confirmed the presence of papillary renal cell carcinoma.   He developed recurrent disease in March 2022 after recurrence including the adrenal metastasis as well as hepatic involvement.  He underwent percutaneous microwave ablation of the adrenal gland as well as hepatic metastasis completed in March 2022 under the care of Dr. Laurence Ferrari.   Nivolumab 480 mg every 4 weeks started on February 22, 2021.  He completed 2 cycles of therapy in November 2022.  Therapy interrupted due to patient's preference.  Current therapy: Active surveillance   Interim History: [name] returns today for repeat evaluation.  Since the last visit, he reports feeling well without any major complaints.  He denies any excessive fatigue, tiredness or weakness.  He denies abdominal pain or palpitation.  He is eating well and gaining weight.  His performance status quality of life remains unchanged.    Medications: Reviewed without changes. Current Outpatient Medications  Medication Sig Dispense Refill   Apoaequorin (PREVAGEN EXTRA STRENGTH PO) Take 1 tablet by mouth daily.     atorvastatin (LIPITOR) 20 MG tablet Take 1 tablet (20 mg total) by mouth at bedtime. 30 tablet 0   ferrous sulfate 325 (65 FE) MG tablet Take 1 tablet (325 mg total) by mouth 2 (two) times daily. Patient  needs office visit for further refills (Patient taking differently: Take 325 mg by mouth 2 (two) times daily.) 60 tablet 1   furosemide (LASIX) 40 MG tablet Take 1 tablet (40 mg total) by mouth 2 (two) times daily. 180 tablet 0   gabapentin (NEURONTIN) 100 MG capsule Take 100 mg by mouth at bedtime.     gemfibrozil (LOPID) 600 MG tablet Take 600 mg by mouth 2 (two) times daily before a meal.     Iron-Vitamins (GERITOL) LIQD Take 10 mLs by mouth daily.     LOKELMA 10 g PACK packet Take 10 g by mouth daily.     megestrol (MEGACE) 40 MG/ML suspension TAKE 10 MLS (400 MG TOTAL) BY MOUTH DAILY. 240 mL 0   metoprolol tartrate (LOPRESSOR) 25 MG tablet Take 1 tablet (25 mg total) by mouth 2 (two) times daily. 180 tablet 3   multivitamin (RENA-VIT) TABS tablet Take 1 tablet by mouth at bedtime. 90 tablet 0   Omega-3 Fatty Acids (FISH OIL PO) Take 1 tablet by mouth daily.     OVER THE COUNTER MEDICATION Take 1 tablet by mouth daily. Focus factor     polyvinyl alcohol (LIQUIFILM TEARS) 1.4 % ophthalmic solution Place 1 drop into both eyes daily as needed for dry eyes.     solifenacin (VESICARE) 5 MG tablet TAKE 1 TABLET (5 MG TOTAL) BY MOUTH DAILY. 30 tablet 5   tamsulosin (FLOMAX) 0.4 MG CAPS capsule TAKE 1 CAPSULE (0.4 MG TOTAL) BY MOUTH DAILY. (Patient taking differently: Take 0.4 mg by mouth daily.) 30 capsule 1   No current facility-administered medications for  this visit.     Allergies:  Allergies  Allergen Reactions   Ace Inhibitors Swelling    lisinopril   Xyzal [Levocetirizine] Other (See Comments)    Break out   Opdivo [Nivolumab] Rash     Physical Exam:  Blood pressure (!) 160/77, pulse 70, temperature 97.8 F (36.6 C), temperature source Temporal, resp. rate 18, height '5\' 11"'$  (1.803 m), weight 208 lb (94.3 kg), SpO2 100 %.   ECOG: 1    General appearance: Comfortable appearing without any discomfort Head: Normocephalic without any trauma Oropharynx: Mucous membranes are  moist and pink without any thrush or ulcers. Eyes: Pupils are equal and round reactive to light. Lymph nodes: No cervical, supraclavicular, inguinal or axillary lymphadenopathy.   Heart:regular rate and rhythm.  S1 and S2 without leg edema. Lung: Clear without any rhonchi or wheezes.  No dullness to percussion. Abdomin: Soft, nontender, nondistended with good bowel sounds.  No hepatosplenomegaly. Musculoskeletal: No joint deformity or effusion.  Full range of motion noted. Neurological: No deficits noted on motor, sensory and deep tendon reflex exam. Skin: No petechial rash or dryness.  Appeared moist.       Lab Results: Lab Results  Component Value Date   WBC 4.6 10/04/2021   HGB 8.9 (L) 10/04/2021   HCT 26.1 (L) 10/04/2021   MCV 89.1 10/04/2021   PLT 171 10/04/2021     Chemistry      Component Value Date/Time   NA 140 10/04/2021 0951   NA 138 12/02/2020 0000   K 3.9 10/04/2021 0951   CL 113 (H) 10/04/2021 0951   CO2 16 (L) 10/04/2021 0951   BUN 68 (H) 10/04/2021 0951   BUN 37 (A) 12/02/2020 0000   CREATININE 5.44 (HH) 10/04/2021 0951   CREATININE 2.11 (H) 12/18/2019 1425   GLU 152 12/02/2020 0000      Component Value Date/Time   CALCIUM 9.7 10/04/2021 0951   ALKPHOS 85 10/04/2021 0951   AST 28 10/04/2021 0951   ALT 19 10/04/2021 0951   BILITOT 0.3 10/04/2021 0951      IMPRESSION: 1. Enlarging retroperitoneal lymph node below the left renal vein, currently 2.6 cm in short axis, compatible with malignancy. 2. Stable nonspecific right adrenal mass. Stable extensive scarring and irregularity in the right kidney upper pole and adjacent portion of the right kidney, much of this is likely therapy related although residual tumor cannot be excluded at this location on today's noncontrast exam. 3. Enlarging L3 inferior endplate lucent lesion, morphology favoring Schmorl's node, lytic metastatic lesion less likely. 4. Other imaging findings of potential clinical  significance: Aortic Atherosclerosis (ICD10-I70.0). Coronary atherosclerosis. Prominent main pulmonary artery, possibly from pulmonary arterial hypertension. Lumbar spondylosis and degenerative disc disease causing left-sided impingement at L4-5 and L5-S1.   Impression and Plan:   76 year old with:  1.  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  He is currently on active surveillance without any additional treatment given his poor tolerance to any anticancer therapy.  CT scan obtained on Oct 04, 2021 was personally reviewed and discussed with the patient.  I see no evidence of rapid progression of his disease though avoidance active intervention at this time.  Treatment options are limited at this time with oral targeted therapy as a potential option in combination with immunotherapy.  I will defer this option unless progression of disease is noted.  We will update his staging scan in 6 months.   2.  Renal failure: His creatinine clearance remains stable at this time without any further decline.  3.  Anemia: Related to chronic kidney disease with the hemoglobin is adequate.  4.  Anorexia: Resolved at this time continues to gain weight.   5.  Follow-up: He will return in 6 months for repeat follow-up.   30  minutes were dedicated to this encounter.  Time spent on reviewing laboratory data, imaging studies, treatment choices and future plan of care review.     Zola Button, MD 6/1/20239:00 AM

## 2021-10-28 ENCOUNTER — Other Ambulatory Visit: Payer: Self-pay | Admitting: Emergency Medicine

## 2021-10-28 DIAGNOSIS — I5032 Chronic diastolic (congestive) heart failure: Secondary | ICD-10-CM

## 2021-11-02 ENCOUNTER — Ambulatory Visit (INDEPENDENT_AMBULATORY_CARE_PROVIDER_SITE_OTHER): Payer: Medicare Other | Admitting: *Deleted

## 2021-11-02 DIAGNOSIS — I10 Essential (primary) hypertension: Secondary | ICD-10-CM

## 2021-11-02 DIAGNOSIS — I5032 Chronic diastolic (congestive) heart failure: Secondary | ICD-10-CM

## 2021-11-02 DIAGNOSIS — E1136 Type 2 diabetes mellitus with diabetic cataract: Secondary | ICD-10-CM

## 2021-11-02 NOTE — Chronic Care Management (AMB) (Signed)
Chronic Care Management   CCM RN Visit Note  11/02/2021 Name: Antonio Adams MRN: [id number] DOB: [birth year]  Subjective: Antonio Adams is a 76 y.o. year old male who is a primary care patient of [name], MD. The care management team was consulted for assistance with disease management and care coordination needs.    Engaged with patient by telephone for follow up visit in response to provider referral for case management and/or care coordination services.   Consent to Services:  The patient was given information about Chronic Care Management services, agreed to services, and gave verbal consent prior to initiation of services.  Please see initial visit note for detailed documentation.  Patient agreed to services and verbal consent obtained.   Assessment: Review of patient past medical history, allergies, medications, health status, including review of consultants reports, laboratory and other test data, was performed as part of comprehensive evaluation and provision of chronic care management services.   SDOH (Social Determinants of Health) assessments and interventions performed:  SDOH Interventions    Flowsheet Row Most Recent Value  SDOH Interventions   Food Insecurity Interventions Intervention Not Indicated  [continues to deny food insecurity]  Transportation Interventions Intervention Not Indicated  [continues to drive self]     CCM Care Plan  Allergies  Allergen Reactions   Ace Inhibitors Swelling    lisinopril   Xyzal [Levocetirizine] Other (See Comments)    Break out   Opdivo [Nivolumab] Rash   Outpatient Encounter Medications as of 11/02/2021  Medication Sig   Apoaequorin (PREVAGEN EXTRA STRENGTH PO) Take 1 tablet by mouth daily.   atorvastatin (LIPITOR) 20 MG tablet Take 1 tablet (20 mg total) by mouth at bedtime.   ferrous sulfate 325 (65 FE) MG tablet Take 1 tablet (325 mg total) by mouth 2 (two) times daily. Patient needs office visit for  further refills (Patient taking differently: Take 325 mg by mouth 2 (two) times daily.)   furosemide (LASIX) 40 MG tablet TAKE 1 TABLET BY MOUTH EVERY DAY   gabapentin (NEURONTIN) 100 MG capsule Take 100 mg by mouth at bedtime.   gemfibrozil (LOPID) 600 MG tablet Take 600 mg by mouth 2 (two) times daily before a meal.   Iron-Vitamins (GERITOL) LIQD Take 10 mLs by mouth daily.   LOKELMA 10 g PACK packet Take 10 g by mouth daily.   megestrol (MEGACE) 40 MG/ML suspension TAKE 10 MLS (400 MG TOTAL) BY MOUTH DAILY.   metoprolol tartrate (LOPRESSOR) 25 MG tablet Take 1 tablet (25 mg total) by mouth 2 (two) times daily.   Omega-3 Fatty Acids (FISH OIL PO) Take 1 tablet by mouth daily.   OVER THE COUNTER MEDICATION Take 1 tablet by mouth daily. Focus factor   polyvinyl alcohol (LIQUIFILM TEARS) 1.4 % ophthalmic solution Place 1 drop into both eyes daily as needed for dry eyes.   solifenacin (VESICARE) 5 MG tablet TAKE 1 TABLET (5 MG TOTAL) BY MOUTH DAILY.   tamsulosin (FLOMAX) 0.4 MG CAPS capsule TAKE 1 CAPSULE (0.4 MG TOTAL) BY MOUTH DAILY. (Patient taking differently: Take 0.4 mg by mouth daily.)   No facility-administered encounter medications on file as of 11/02/2021.   Patient Active Problem List   Diagnosis Date Noted   Chronic heart failure with preserved ejection fraction (HFpEF) ([place]) 08/12/2021   Tinea cruris 08/12/2021   Hyperkalemia 07/20/2021   Anemia due to chronic kidney disease 07/20/2021   CKD (chronic kidney disease) stage 5, GFR less than 15 ml/min ([place]) 05/24/2021  BPH (benign prostatic hyperplasia) 05/24/2021   Dental caries on smooth surface penetrating into pulp 05/20/2021   Diabetic renal disease ([place]) 05/20/2021   Dry eye syndrome of bilateral lacrimal glands 05/20/2021   Osteoarthrosis 05/20/2021   Senile nuclear sclerosis 05/20/2021   Vitamin D deficiency 05/20/2021   Vitreous degeneration, unspecified eye 05/20/2021   Intrinsic eczema 04/18/2021   Atherosclerosis  of aorta ([place]) 12/30/2020   Mixed conductive and sensorineural hearing loss of left ear with restricted hearing of right ear 09/01/2020   Tinnitus of both ears 05/20/2020   Metastatic renal cell carcinoma ([place]) 02/26/2020   Diabetic polyneuropathy associated with type 2 diabetes mellitus ([place]) 12/18/2019   Murmur, cardiac 10/08/2018   DDD (degenerative disc disease), cervical 10/08/2018   OAB (overactive bladder) 04/04/2018   Seasonal allergic rhinitis due to pollen 11/01/2016   Microalbuminuria due to type 2 diabetes mellitus ([place]) 07/27/2016   Varicose veins of both lower extremities 08/06/2015   Varicose veins of lower extremities with ulcer ([place]) 06/16/2015   Internal hemorrhoid, bleeding 08/03/2014   Post traumatic stress disorder (PTSD) 01/29/2014   Spinal stenosis of lumbar region with radiculopathy 10/30/2011    Class: Chronic   Obstructive sleep apnea 11/24/2010   Obesity 11/24/2010   Routine general medical examination at a health care facility 11/24/2010   ADENOCARCINOMA, PROSTATE, GLEASON GRADE 6 12/21/2009   Type 2 diabetes mellitus with diabetic cataract ([place]) 03/16/2007   Essential hypertension 03/16/2007   GERD 03/16/2007   Conditions to be addressed/monitored:  CHF, HTN, and DMII  Care Plan : RN Care Manager Plan of Care  Updates made by Knox Royalty, RN since 11/02/2021 12:00 AM     Problem: Chronic Disease Management Needs   Priority: Medium     Long-Range Goal: Ongoing adherence to established plan of care for long term chronic disease management   Start Date: 02/07/2021  Expected End Date: 02/07/2022  Priority: Medium  Note:   Current Barriers:  Chronic Disease Management support and education needs related to HTN and DMII Hospitalization January 9-15, 2023 for dehydration due to N/V/D: discharged with home health services through [facility] Hospitalization March 8-10, 2023 for hyperkalemia; patient diagnosed with CHF during this visit; was discharged  home to self-care  RNCM Clinical Goal(s): Care plan goals re-established and extended 02/07/21: Patient will demonstrate ongoing health management independence for HTN, DMII  through collaboration with RN Care manager, provider, and care team.   Interventions: 1:1 collaboration with primary care provider regarding development and update of comprehensive plan of care as evidenced by provider attestation and co-signature Inter-disciplinary care team collaboration (see longitudinal plan of care) Evaluation of current treatment plan related to  self management and patient's adherence to plan as established by provider Review of patient status, including review of consultants reports, relevant laboratory and other test results, and medications completed SDOH updated: no new/ unmet concerns identified Pain assessment updated: denies pain today Falls assessment updated: continues to deny new/ recent falls x 12 months- continues using cane as indicated/ needed;  positive reinforcement provided with encouragement to continue efforts at fall prevention; previously provided education around fall risks/ prevention reinforced Medications discussed: reports continues to independently self-manage and denies current concerns/ issues/ questions around medications; endorses adherence to taking all medications as prescribed Reviewed recent provider office visits 10/13/21- oncology provider; patient verbalizes good understanding of post-office visit instructions, reports "got a real good report;" and denies post-visit questions Patient and caregiver report new concern related to short term memory issues: state, "  it just seems like he can't remember things he should be remembering;" I encouraged them to discuss with care providers at next scheduled office visit; noted that patient does not currently have PCP office visit scheduled- his last visit was 08/12/21, and I reminded him that PCP wanted to see him again for next  visit in 6 months- they will schedule Reviewed upcoming scheduled provider appointments: 11/03/21- orthopedic provider; 04/14/22 and 04/20/22- oncology provider; patient confirms is aware of all and has plans to attend as scheduled Discussed plans with patient for ongoing care management follow up and provided patient with direct contact information for care management team     Diabetes:  (Status: 11/02/21: Goal on Track (progressing): YES.) Long Term Goal Lab Results  Component Value Date   HGBA1C 5.8 (H) 05/23/2021  Provided education to patient about basic DM disease process; Confirmed no concerns around signs/ symptoms low blood sugars- he is unable to review blood sugars from home monitoring today- he is not at his home/ records; states "they have been fine" Reinforced previously provided education around signs/ symptoms and risks of low blood sugars along with corresponding action plan for same- he denies recent signs/ symptoms low blood sugar Confirmed patient continues trying to follow DMII/ heart healthy diet- positive reinforcement provided with encouragement to continue efforts Reports recent office visit with [place] provider- he is waiting on tests strips and has been out of these for quite some time, confirms they have been ordered and verbalizes plans to resume blood sugar monitoring once he receives test strips Assessed patient's understanding of meaning/ significance of A1-C values: (good) baseline understanding of same- Reviewed individual historical A1-C trends and provided education around correlation of A1-C value to blood sugar levels at home over 3 months Confirmed patient is primarily diet controlled diabetic; confirmed he is not taking medications for DMII  Heart Failure/ HTN Interventions:  (Status: 11/02/21: Goal on track:  Yes.) Long Term Goal Basic overview and discussion of pathophysiology of Heart Failure reviewed Provided education on low sodium diet Discussed importance of  daily weight and advised patient to weigh and record daily Reviewed role of diuretics in prevention of fluid overload and management of heart failure; Discussed the importance of keeping all appointments with provider  Patient reports he has been monitoring/ recording daily weights at home since our last outreach: reports general weight ranges between "194-199;" he confirms no overnight weight gain > 3 lbs; confirms no signs/ symptoms yellow CHF zone; patient reports feeling occasionally short of breath with activity/ walking- states he "just rests for a few minutes" which "completely" resolves this symptom       Reinforced previously provided education around general guidelines for action plan for daily and/or weekly weight gain- he is new to HF and will require ongoing reinforcement, support/ education/ reminding Confirmed patient now taking diuretic as prescribed "twice a day" Evaluation of current treatment plan related to hypertension self management and patient's adherence to plan as established by provider  Discussed complications of poorly controlled blood pressure such as heart disease, stroke, circulatory complications, vision complications, kidney impairment, sexual dysfunction Confirmed patient continues monitoring/ writing down on paper blood pressures at home, several times weekly-- blood pressures reviewed from home monitoring; again states "they have been fine;" he denies concerns around dizziness/ lightheadedness and reports general blood pressure ranges between 134-148/77-88  Last practice recorded BP readings:  BP Readings from Last 3 Encounters:  08/17/21 (!) 158/82  08/12/21 (!) 166/74  08/03/21 (!) 142/78  Most  recent eGFR/CrCl: No results found for: EGFR  No components found for: CRCL  Patient Goals/Self-Care Activities: As evidenced by review of EHR, collaboration with care team, and patient reporting during CCM RN CM outreach,  Patient Jaceyon will: Take medications as  prescribed   Attend all scheduled provider appointments  Call pharmacy for medication refills  Call provider office for new concerns or questions  Continue checking your weight every day in the morning and write these values down on paper- we will review these values during each of our phone calls The weight you reported today was: 194 lbs If you gain 3-4 lbs overnight please contact your cardiologist to get instruction as to what you should do-- overnight weight gain is the earliest indicator that you are retaining fluid; weight gain usually occurs before shortness of breath or swelling does-- so taking action early can prevent you from getting overloaded with fluid, and possibly having to go to the hospital Continue to check blood sugars at home every day first thing in the morning before you eat; write the values down in your notebook: we will review during each of our telephone calls Take the blood sugar log to all doctor visits- this will help your doctor know which medications you should be on and how your medications are working  Continue your efforts to follow a low sugar, low-carbohydrate, heart healthy, low salt diet Continue to check blood pressures at home and write them down on paper  Keep up the great work to prevent falls; continue to use your cane as needed Stay as active as you can without over-doing activity     Plan: Telephone follow up appointment with care management team member scheduled for: Tuesday, January 03, 2022 at 9:45 am The patient has been provided with contact information for the care management team and has been advised to call with any health related questions or concerns   Oneta Rack, RN, BSN, [place] [phone]: direct office

## 2021-11-02 NOTE — Patient Instructions (Signed)
Visit Information  Jak[place] and [place], thank you for taking time to talk with me today. Please don't hesitate to contact me if I can be of assistance to you before our next scheduled telephone appointment  Below are the goals we discussed today:  Patient Self-Care Activities: Patient Antonio Adams will: Take medications as prescribed   Attend all scheduled provider appointments  Call pharmacy for medication refills  Call provider office for new concerns or questions  Continue checking your weight every day in the morning and write these values down on paper- we will review these values during each of our phone calls The weight you reported today was: 194 lbs If you gain 3-4 lbs overnight please contact your cardiologist to get instruction as to what you should do-- overnight weight gain is the earliest indicator that you are retaining fluid; weight gain usually occurs before shortness of breath or swelling does-- so taking action early can prevent you from getting overloaded with fluid, and possibly having to go to the hospital Continue to check blood sugars at home every day first thing in the morning before you eat; write the values down in your notebook: we will review during each of our telephone calls Take the blood sugar log to all doctor visits- this will help your doctor know which medications you should be on and how your medications are working  Continue your efforts to follow a low sugar, low-carbohydrate, heart healthy, low salt diet Continue to check blood pressures at home and write them down on paper  Keep up the great work to prevent falls; continue to use your cane as needed Stay as active as you can without over-doing activity  Our next scheduled telephone follow up visit/ appointment is scheduled on: Tuesday, January 03, 2022 at 9:45 am- This is a PHONE [place] appointment  If you need to cancel or re-schedule our visit, please call [phone] and our care guide team will be happy to  assist you.   I look forward to hearing about your progress.   Oneta Rack, RN, BSN, [place] [phone]: direct office  If you are experiencing a Mental Health or [place] or need someone to talk to, please  call the Suicide and Crisis Lifeline: 988 call the [place] National Suicide Prevention Lifeline: 810 541 4867 or TTY: 3393275657 TTY 807 145 8069) to talk to a trained counselor call 1-800-273-TALK (toll free, 24 hour hotline) go to [facility] [address], [place] [phone]) call 911   The patient verbalized understanding of instructions, educational materials, and care plan provided today and agreed to receive a mailed copy of patient instructions, educational materials, and care plan  Heart-Healthy Eating Plan Many factors influence your heart (coronary) health, including eating and exercise habits. Coronary risk increases with abnormal blood fat (lipid) levels. Heart-healthy meal planning includes limiting unhealthy fats, increasing healthy fats, and making other diet and lifestyle changes. What is my plan? Your health care provider may recommend that you: Limit your fat intake to _________% or less of your total calories each day. Limit your saturated fat intake to _________% or less of your total calories each day. Limit the amount of cholesterol in your diet to less than _________ mg per day. What are tips for following this plan? Cooking Cook foods using methods other than frying. Baking, boiling, grilling, and broiling are all good options. Other ways to reduce fat include: Removing the skin from poultry.  Removing all visible fats from meats. Steaming vegetables in water or broth. Meal planning  At meals, imagine dividing your plate into fourths: Fill one-half of your plate with vegetables and green salads. Fill one-fourth of your plate  with whole grains. Fill one-fourth of your plate with lean protein foods. Eat 4-5 servings of vegetables per day. One serving equals 1 cup raw or cooked vegetable, or 2 cups raw leafy greens. Eat 4-5 servings of fruit per day. One serving equals 1 medium whole fruit,  cup dried fruit,  cup fresh, frozen, or canned fruit, or  cup 100% fruit juice. Eat more foods that contain soluble fiber. Examples include apples, broccoli, carrots, beans, peas, and barley. Aim to get 25-30 g of fiber per day. Increase your consumption of legumes, nuts, and seeds to 4-5 servings per week. One serving of dried beans or legumes equals  cup cooked, 1 serving of nuts is  cup, and 1 serving of seeds equals 1 tablespoon. Fats Choose healthy fats more often. Choose monounsaturated and polyunsaturated fats, such as olive and canola oils, flaxseeds, walnuts, almonds, and seeds. Eat more omega-3 fats. Choose salmon, mackerel, sardines, tuna, flaxseed oil, and ground flaxseeds. Aim to eat fish at least 2 times each week. Check food labels carefully to identify foods with trans fats or high amounts of saturated fat. Limit saturated fats. These are found in animal products, such as meats, butter, and cream. Plant sources of saturated fats include palm oil, palm kernel oil, and coconut oil. Avoid foods with partially hydrogenated oils in them. These contain trans fats. Examples are stick margarine, some tub margarines, cookies, crackers, and other baked goods. Avoid fried foods. General information Eat more home-cooked food and less restaurant, buffet, and fast food. Limit or avoid alcohol. Limit foods that are high in starch and sugar. Lose weight if you are overweight. Losing just 5-10% of your body weight can help your overall health and prevent diseases such as diabetes and heart disease. Monitor your salt (sodium) intake, especially if you have high blood pressure. Talk with your health care provider about your sodium  intake. Try to incorporate more vegetarian meals weekly. What foods can I eat? Fruits All fresh, canned (in natural juice), or frozen fruits. Vegetables Fresh or frozen vegetables (raw, steamed, roasted, or grilled). Green salads. Grains Most grains. Choose whole wheat and whole grains most of the time. Rice and pasta, including brown rice and pastas made with whole wheat. Meats and other proteins Lean, well-trimmed beef, veal, pork, and lamb. Chicken and [place] without skin. All fish and shellfish. Wild duck, rabbit, pheasant, and venison. Egg whites or low-cholesterol egg substitutes. Dried beans, peas, lentils, and tofu. Seeds and most nuts. Dairy Low-fat or nonfat cheeses, including ricotta and mozzarella. Skim or 1% milk (liquid, powdered, or evaporated). Buttermilk made with low-fat milk. Nonfat or low-fat yogurt. Fats and oils Non-hydrogenated (trans-free) margarines. Vegetable oils, including soybean, sesame, sunflower, olive, peanut, safflower, corn, canola, and cottonseed. Salad dressings or mayonnaise made with a vegetable oil. Beverages Water (mineral or sparkling). Coffee and tea. Diet carbonated beverages. Sweets and desserts Sherbet, gelatin, and fruit ice. Small amounts of dark chocolate. Limit all sweets and desserts. Seasonings and condiments All seasonings and condiments. The items listed above may not be a complete list of foods and beverages you can eat. Contact a dietitian for more options. What foods are not recommended? Fruits Canned fruit in heavy syrup. Fruit in cream or butter sauce. Fried fruit. Limit coconut. Vegetables Vegetables  cooked in cheese, cream, or butter sauce. Fried vegetables. Grains Breads made with saturated or trans fats, oils, or whole milk. Croissants. Sweet rolls. Donuts. High-fat crackers, such as cheese crackers. Meats and other proteins Fatty meats, such as hot dogs, ribs, sausage, bacon, rib-eye roast or steak. High-fat deli meats,  such as salami and bologna. Caviar. Domestic duck and goose. Organ meats, such as liver. Dairy Cream, sour cream, cream cheese, and creamed cottage cheese. Whole-milk cheeses. Whole or 2% milk (liquid, evaporated, or condensed). Whole buttermilk. Cream sauce or high-fat cheese sauce. Whole-milk yogurt. Fats and oils Meat fat, or shortening. Cocoa butter, hydrogenated oils, palm oil, coconut oil, palm kernel oil. Solid fats and shortenings, including bacon fat, salt pork, lard, and butter. Nondairy cream substitutes. Salad dressings with cheese or sour cream. Beverages Regular sodas and any drinks with added sugar. Sweets and desserts Frosting. Pudding. Cookies. Cakes. Pies. Milk chocolate or white chocolate. Buttered syrups. Full-fat ice cream or ice cream drinks. The items listed above may not be a complete list of foods and beverages to avoid. Contact a dietitian for more information. Summary Heart-healthy meal planning includes limiting unhealthy fats, increasing healthy fats, and making other diet and lifestyle changes. Lose weight if you are overweight. Losing just 5-10% of your body weight can help your overall health and prevent diseases such as diabetes and heart disease. Focus on eating a balance of foods, including fruits and vegetables, low-fat or nonfat dairy, lean protein, nuts and legumes, whole grains, and heart-healthy oils and fats. This information is not intended to replace advice given to you by your health care provider. Make sure you discuss any questions you have with your health care provider. Document Revised: 09/09/2020 Document Reviewed: 09/09/2020 Elsevier Patient Education  2022 [employer].

## 2021-11-03 ENCOUNTER — Other Ambulatory Visit: Payer: Self-pay | Admitting: Oncology

## 2021-11-03 ENCOUNTER — Ambulatory Visit: Payer: Medicare Other | Admitting: Surgery

## 2021-11-03 ENCOUNTER — Encounter: Payer: Self-pay | Admitting: Surgery

## 2021-11-03 ENCOUNTER — Ambulatory Visit: Payer: Self-pay

## 2021-11-03 VITALS — BP 173/84 | HR 66 | Ht 71.0 in | Wt 208.0 lb

## 2021-11-03 DIAGNOSIS — M47816 Spondylosis without myelopathy or radiculopathy, lumbar region: Secondary | ICD-10-CM

## 2021-11-03 DIAGNOSIS — M545 Low back pain, unspecified: Secondary | ICD-10-CM | POA: Diagnosis not present

## 2021-11-11 DIAGNOSIS — E1159 Type 2 diabetes mellitus with other circulatory complications: Secondary | ICD-10-CM

## 2021-11-11 DIAGNOSIS — I509 Heart failure, unspecified: Secondary | ICD-10-CM | POA: Diagnosis not present

## 2021-11-11 DIAGNOSIS — I110 Hypertensive heart disease with heart failure: Secondary | ICD-10-CM

## 2021-11-17 DIAGNOSIS — E875 Hyperkalemia: Secondary | ICD-10-CM | POA: Diagnosis not present

## 2021-11-17 DIAGNOSIS — N3281 Overactive bladder: Secondary | ICD-10-CM | POA: Diagnosis not present

## 2021-11-17 DIAGNOSIS — N189 Chronic kidney disease, unspecified: Secondary | ICD-10-CM | POA: Diagnosis not present

## 2021-11-17 DIAGNOSIS — N2581 Secondary hyperparathyroidism of renal origin: Secondary | ICD-10-CM | POA: Diagnosis not present

## 2021-11-17 DIAGNOSIS — E1122 Type 2 diabetes mellitus with diabetic chronic kidney disease: Secondary | ICD-10-CM | POA: Diagnosis not present

## 2021-11-17 DIAGNOSIS — N184 Chronic kidney disease, stage 4 (severe): Secondary | ICD-10-CM | POA: Diagnosis not present

## 2021-11-17 DIAGNOSIS — D631 Anemia in chronic kidney disease: Secondary | ICD-10-CM | POA: Diagnosis not present

## 2021-11-17 DIAGNOSIS — I129 Hypertensive chronic kidney disease with stage 1 through stage 4 chronic kidney disease, or unspecified chronic kidney disease: Secondary | ICD-10-CM | POA: Diagnosis not present

## 2021-11-17 DIAGNOSIS — C649 Malignant neoplasm of unspecified kidney, except renal pelvis: Secondary | ICD-10-CM | POA: Diagnosis not present

## 2021-11-18 ENCOUNTER — Encounter (HOSPITAL_COMMUNITY): Payer: Self-pay

## 2021-11-18 ENCOUNTER — Emergency Department (HOSPITAL_COMMUNITY)
Admission: EM | Admit: 2021-11-18 | Discharge: 2021-11-18 | Disposition: A | Discharge status: Home / Self Care | Payer: Medicare Other | Attending: Emergency Medicine | Admitting: Emergency Medicine

## 2021-11-18 ENCOUNTER — Other Ambulatory Visit: Payer: Self-pay

## 2021-11-18 DIAGNOSIS — D649 Anemia, unspecified: Secondary | ICD-10-CM | POA: Diagnosis not present

## 2021-11-18 DIAGNOSIS — R799 Abnormal finding of blood chemistry, unspecified: Secondary | ICD-10-CM | POA: Diagnosis not present

## 2021-11-18 LAB — CBC WITH DIFFERENTIAL/PLATELET
Abs Immature Granulocytes: 0.02 10*3/uL (ref 0.00–0.07)
Basophils Absolute: 0 10*3/uL (ref 0.0–0.1)
Basophils Relative: 1 %
Eosinophils Absolute: 0.1 10*3/uL (ref 0.0–0.5)
Eosinophils Relative: 2 %
HCT: 21.8 % — ABNORMAL LOW (ref 39.0–52.0)
Hemoglobin: 7 g/dL — ABNORMAL LOW (ref 13.0–17.0)
Immature Granulocytes: 1 %
Lymphocytes Relative: 23 %
Lymphs Abs: 1 10*3/uL (ref 0.7–4.0)
MCH: 30 pg (ref 26.0–34.0)
MCHC: 32.1 g/dL (ref 30.0–36.0)
MCV: 93.6 fL (ref 80.0–100.0)
Monocytes Absolute: 0.7 10*3/uL (ref 0.1–1.0)
Monocytes Relative: 16 %
Neutro Abs: 2.5 10*3/uL (ref 1.7–7.7)
Neutrophils Relative %: 57 %
Platelets: 186 10*3/uL (ref 150–400)
RBC: 2.33 MIL/uL — ABNORMAL LOW (ref 4.22–5.81)
RDW: 15.7 % — ABNORMAL HIGH (ref 11.5–15.5)
WBC: 4.2 10*3/uL (ref 4.0–10.5)
nRBC: 0.9 % — ABNORMAL HIGH (ref 0.0–0.2)

## 2021-11-18 LAB — COMPREHENSIVE METABOLIC PANEL
ALT: 17 U/L (ref 0–44)
AST: 28 U/L (ref 15–41)
Albumin: 3.4 g/dL — ABNORMAL LOW (ref 3.5–5.0)
Alkaline Phosphatase: 66 U/L (ref 38–126)
Anion gap: 16 — ABNORMAL HIGH (ref 5–15)
BUN: 70 mg/dL — ABNORMAL HIGH (ref 8–23)
CO2: 12 mmol/L — ABNORMAL LOW (ref 22–32)
Calcium: 9.8 mg/dL (ref 8.9–10.3)
Chloride: 110 mmol/L (ref 98–111)
Creatinine, Ser: 6.39 mg/dL — ABNORMAL HIGH (ref 0.61–1.24)
GFR, Estimated: 8 mL/min — ABNORMAL LOW (ref >60)
Glucose, Bld: 84 mg/dL (ref 70–99)
Potassium: 4.3 mmol/L (ref 3.5–5.1)
Sodium: 138 mmol/L (ref 135–145)
Total Bilirubin: 0.8 mg/dL (ref 0.3–1.2)
Total Protein: 6.8 g/dL (ref 6.5–8.1)

## 2021-11-18 LAB — POC OCCULT BLOOD, ED: Fecal Occult Bld: NEGATIVE

## 2021-11-18 LAB — PREPARE RBC (CROSSMATCH)

## 2021-11-18 MED ORDER — SODIUM CHLORIDE 0.9 % IV SOLN
10.0000 mL/h | Freq: Once | INTRAVENOUS | Status: AC
  Administered 2021-11-18: 10 mL/h via INTRAVENOUS

## 2021-11-18 NOTE — ED Provider Triage Note (Signed)
Emergency Medicine Provider Triage Evaluation Note  Antonio Adams , a 75 y.o. male  was evaluated in triage.  Pt complains of anemia.  He has a history of chronic kidney disease is followed by nephrology. had labs drawn this morning and was told his hemoglobin was 6 and he needed to come in for transfusion.  He complains of about a month of worsening exertional dyspnea.  Review of Systems  Positive: Anemia Negative: Melena  Physical Exam  BP 130/73   Pulse 82   Temp 98.2 F (36.8 C)   Resp (!) 30   Ht '5\' 11"'$  (1.803 m)   Wt 92.5 kg   SpO2 98%   BMI 28.45 kg/m  Gen:   Awake, no distress   Resp:  Normal effort  MSK:   Moves extremities without difficulty  Other:  No active shortness of breath at rest  Medical Decision Making  Medically screening exam initiated at 4:14 PM.  Appropriate orders placed.  Antonio Adams was informed that the remainder of the evaluation will be completed by another provider, this initial triage assessment does not replace that evaluation, and the importance of remaining in the ED until their evaluation is complete.  Previous history of need for blood transfusions.  His kidney doctors think this is secondary to his kidney function.  Orders and work-up initiated   Margarita Mail, PA-C 11/18/21 1619

## 2021-11-18 NOTE — Discharge Instructions (Addendum)
Follow-up with your doctor within the week.  Return back to the ER if you have difficulty breathing lightheadedness dizziness chest pain or any additional concerns.

## 2021-11-18 NOTE — ED Provider Notes (Signed)
[facility] EMERGENCY DEPARTMENT Provider Note   CSN: [id number] Arrival date & time: 11/18/21  1455     History  Chief Complaint  Patient presents with   Abnormal Lab    Antonio Adams is a 76 y.o. male.  Patient presents ER chief complaint of anemia.  He states he has a history of chronic kidney disease and has had low hemoglobin in the past before due to his kidney disease.  He has been transfused before in the past.  He states he had no other symptoms no headache no chest pain no abdominal pain.  No bloody stools.  He went to see his nephrology team for routine checkup today, labs showed low hemoglobin he was sent to the ER.  Denies chest pain or shortness of breath.       Home Medications Prior to Admission medications   Medication Sig Start Date End Date Taking? Authorizing Provider  Apoaequorin (PREVAGEN EXTRA STRENGTH PO) Take 1 tablet by mouth daily.    [provider]  atorvastatin (LIPITOR) 20 MG tablet Take 1 tablet (20 mg total) by mouth at bedtime. 05/29/21 09/23/21  Kayleen Memos, DO  ferrous sulfate 325 (65 FE) MG tablet Take 1 tablet (325 mg total) by mouth 2 (two) times daily. Patient needs office visit for further refills Patient taking differently: Take 325 mg by mouth 2 (two) times daily. 02/24/19   Irene Shipper, MD  furosemide (LASIX) 40 MG tablet TAKE 1 TABLET BY MOUTH EVERY DAY 10/28/21   [name], MD  gabapentin (NEURONTIN) 100 MG capsule Take 100 mg by mouth at bedtime.    [provider]  gemfibrozil (LOPID) 600 MG tablet Take 600 mg by mouth 2 (two) times daily before a meal.    [provider]  Iron-Vitamins (GERITOL) LIQD Take 10 mLs by mouth daily.    [provider]  LOKELMA 10 g PACK packet Take 10 g by mouth daily. 05/04/21   [provider]  megestrol (MEGACE) 40 MG/ML suspension TAKE 10 MLS (400 MG TOTAL) BY MOUTH DAILY. 11/03/21   Wyatt Portela, MD  metoprolol tartrate  (LOPRESSOR) 25 MG tablet Take 1 tablet (25 mg total) by mouth 2 (two) times daily. 08/26/21 11/24/21  Loel Dubonnet, NP  Omega-3 Fatty Acids (FISH OIL PO) Take 1 tablet by mouth daily.    [provider]  OVER THE COUNTER MEDICATION Take 1 tablet by mouth daily. Focus factor    [provider]  polyvinyl alcohol (LIQUIFILM TEARS) 1.4 % ophthalmic solution Place 1 drop into both eyes daily as needed for dry eyes.    [provider]  solifenacin (VESICARE) 5 MG tablet TAKE 1 TABLET (5 MG TOTAL) BY MOUTH DAILY. 06/02/21   [name], MD  tamsulosin (FLOMAX) 0.4 MG CAPS capsule TAKE 1 CAPSULE (0.4 MG TOTAL) BY MOUTH DAILY. Patient taking differently: Take 0.4 mg by mouth daily. 01/20/14   [name], MD      Allergies    Ace inhibitors, Xyzal [levocetirizine], and Opdivo [nivolumab]    Review of Systems   Review of Systems  Constitutional:  Negative for fever.  HENT:  Negative for ear pain and sore throat.   Eyes:  Negative for pain.  Respiratory:  Negative for cough.   Cardiovascular:  Negative for chest pain.  Gastrointestinal:  Negative for abdominal pain.  Genitourinary:  Negative for flank pain.  Musculoskeletal:  Negative for back pain.  Skin:  Negative for color change and rash.  Neurological:  Negative for syncope.  All other systems reviewed and are negative.   Physical Exam Updated Vital Signs BP (!) 174/84   Pulse 68   Temp 98.1 F (36.7 C) (Oral)   Resp (!) 22   Ht '5\' 11"'$  (1.803 m)   Wt 92.5 kg   SpO2 100%   BMI 28.45 kg/m  Physical Exam Constitutional:      Appearance: He is well-developed.  HENT:     Head: Normocephalic.     Nose: Nose normal.  Eyes:     Extraocular Movements: Extraocular movements intact.  Cardiovascular:     Rate and Rhythm: Normal rate.  Pulmonary:     Effort: Pulmonary effort is normal.  Skin:    Coloration: Skin is not jaundiced.  Neurological:     Mental Status: He is alert. Mental status is at  baseline.     ED Results / Procedures / Treatments   Labs (all labs ordered are listed, but only abnormal results are displayed) Labs Reviewed  CBC WITH DIFFERENTIAL/PLATELET - Abnormal; Notable for the following components:      Result Value   RBC 2.33 (*)    Hemoglobin 7.0 (*)    HCT 21.8 (*)    RDW 15.7 (*)    nRBC 0.9 (*)    All other components within normal limits  COMPREHENSIVE METABOLIC PANEL - Abnormal; Notable for the following components:   CO2 12 (*)    BUN 70 (*)    Creatinine, Ser 6.39 (*)    Albumin 3.4 (*)    GFR, Estimated 8 (*)    Anion gap 16 (*)    All other components within normal limits  POC OCCULT BLOOD, ED  TYPE AND SCREEN  PREPARE RBC (CROSSMATCH)    EKG None  Radiology No results found.  Procedures .Critical Care  Performed by: Luna Fuse, MD Authorized by: Luna Fuse, MD   Critical care provider statement:    Critical care time (minutes):  30   Critical care time was exclusive of:  Separately billable procedures and treating other patients and teaching time   Critical care was necessary to treat or prevent imminent or life-threatening deterioration of the following conditions:  Circulatory failure Comments:     Severe anemia requiring blood transfusion     Medications Ordered in ED Medications  0.9 %  sodium chloride infusion (10 mL/hr Intravenous New Bag/Given 11/18/21 2003)    ED Course/ Medical Decision Making/ A&P Clinical Course as of 11/18/21 2149  Fri Nov 18, 2021  1809 Creatinine(!): 6.39 [AH]    Clinical Course User Index [AH] Margarita Mail, PA-C                           Medical Decision Making Amount and/or Complexity of Data Reviewed Labs: ordered.  Risk Prescription drug management.   History from family at bedside.  Cardiac monitoring showing sinus rhythm.  Review of records shows office visit November 03, 2021 for low back pain.  Work-up today included labs CBC chemistry.  White count low at 7.   Patient transfused 1 unit of PRBCs.  Fecal occult blood test is negative.  Advised to follow-up with his gastroenterologist and nephrologist this week.  Advised return if he has worsening symptoms any blood in the stool fevers pain or any additional concerns.        Final Clinical Impression(s) / ED Diagnoses  Final diagnoses:  Symptomatic anemia    Rx / DC Orders ED Discharge Orders     None         Luna Fuse, MD 11/18/21 2149

## 2021-11-18 NOTE — ED Triage Notes (Signed)
Pt arrived POV from the kidney center stating they told him his hemoglobin was 6 and he needed a transfusion. Pt is asymptomatic and denies any pain.

## 2021-11-19 LAB — BPAM RBC
Blood Product Expiration Date: 202307122359
Blood Product Expiration Date: 202307122359
ISSUE DATE / TIME: 202307072017
Unit Type and Rh: 9500
Unit Type and Rh: 9500

## 2021-11-19 LAB — TYPE AND SCREEN
ABO/RH(D): O POS
Antibody Screen: NEGATIVE
Donor AG Type: NEGATIVE
Donor AG Type: NEGATIVE
Unit division: 0
Unit division: 0

## 2021-11-24 ENCOUNTER — Ambulatory Visit
Admission: RE | Admit: 2021-11-24 | Discharge: 2021-11-24 | Disposition: A | Discharge status: Home / Self Care | Payer: Medicare Other | Source: Ambulatory Visit | Attending: Surgery | Admitting: Surgery

## 2021-11-24 DIAGNOSIS — M47816 Spondylosis without myelopathy or radiculopathy, lumbar region: Secondary | ICD-10-CM

## 2021-11-24 DIAGNOSIS — M545 Low back pain, unspecified: Secondary | ICD-10-CM

## 2021-12-05 ENCOUNTER — Other Ambulatory Visit: Payer: Self-pay | Admitting: Internal Medicine

## 2021-12-05 DIAGNOSIS — N3281 Overactive bladder: Secondary | ICD-10-CM

## 2021-12-07 ENCOUNTER — Other Ambulatory Visit: Payer: Self-pay | Admitting: Oncology

## 2021-12-13 ENCOUNTER — Ambulatory Visit: Payer: Medicare Other | Admitting: Orthopaedic Surgery

## 2021-12-13 ENCOUNTER — Encounter: Payer: Self-pay | Admitting: Orthopaedic Surgery

## 2021-12-13 DIAGNOSIS — M545 Low back pain, unspecified: Secondary | ICD-10-CM

## 2021-12-15 ENCOUNTER — Telehealth: Payer: Self-pay

## 2021-12-15 ENCOUNTER — Ambulatory Visit (INDEPENDENT_AMBULATORY_CARE_PROVIDER_SITE_OTHER): Payer: Medicare Other | Admitting: Internal Medicine

## 2021-12-15 ENCOUNTER — Encounter: Payer: Self-pay | Admitting: Internal Medicine

## 2021-12-15 VITALS — BP 138/76 | HR 79 | Temp 98.5°F | Ht 71.0 in | Wt 220.0 lb

## 2021-12-15 DIAGNOSIS — C641 Malignant neoplasm of right kidney, except renal pelvis: Secondary | ICD-10-CM | POA: Diagnosis not present

## 2021-12-15 DIAGNOSIS — G893 Neoplasm related pain (acute) (chronic): Secondary | ICD-10-CM

## 2021-12-15 DIAGNOSIS — M5416 Radiculopathy, lumbar region: Secondary | ICD-10-CM | POA: Diagnosis not present

## 2021-12-15 DIAGNOSIS — M5030 Other cervical disc degeneration, unspecified cervical region: Secondary | ICD-10-CM | POA: Diagnosis not present

## 2021-12-15 DIAGNOSIS — D649 Anemia, unspecified: Secondary | ICD-10-CM | POA: Diagnosis not present

## 2021-12-15 DIAGNOSIS — M545 Low back pain, unspecified: Secondary | ICD-10-CM | POA: Insufficient documentation

## 2021-12-15 DIAGNOSIS — M48061 Spinal stenosis, lumbar region without neurogenic claudication: Secondary | ICD-10-CM | POA: Diagnosis not present

## 2021-12-15 LAB — VITAMIN B12: Vitamin B-12: 789 pg/mL (ref 211–911)

## 2021-12-15 LAB — CBC WITH DIFFERENTIAL/PLATELET
Basophils Absolute: 0 10*3/uL (ref 0.0–0.1)
Basophils Relative: 0.9 % (ref 0.0–3.0)
Eosinophils Absolute: 0.1 10*3/uL (ref 0.0–0.7)
Eosinophils Relative: 2.8 % (ref 0.0–5.0)
HCT: 20.3 % — CL (ref 39.0–52.0)
Hemoglobin: 6.8 g/dL — CL (ref 13.0–17.0)
Lymphocytes Relative: 16.8 % (ref 12.0–46.0)
Lymphs Abs: 0.6 10*3/uL — ABNORMAL LOW (ref 0.7–4.0)
MCHC: 33.7 g/dL (ref 30.0–36.0)
MCV: 88.9 fl (ref 78.0–100.0)
Monocytes Absolute: 0.5 10*3/uL (ref 0.1–1.0)
Monocytes Relative: 13.6 % — ABNORMAL HIGH (ref 3.0–12.0)
Neutro Abs: 2.4 10*3/uL (ref 1.4–7.7)
Neutrophils Relative %: 65.9 % (ref 43.0–77.0)
Platelets: 163 10*3/uL (ref 150.0–400.0)
RBC: 2.28 Mil/uL — ABNORMAL LOW (ref 4.22–5.81)
RDW: 16.4 % — ABNORMAL HIGH (ref 11.5–15.5)
WBC: 3.7 10*3/uL — ABNORMAL LOW (ref 4.0–10.5)

## 2021-12-15 LAB — FOLATE: Folate: 23.5 ng/mL (ref >5.9)

## 2021-12-15 LAB — IBC + FERRITIN
Ferritin: 638.8 ng/mL — ABNORMAL HIGH (ref 22.0–322.0)
Iron: 92 ug/dL (ref 42–165)
Saturation Ratios: 31 % (ref 20.0–50.0)
TIBC: 296.8 ug/dL (ref 250.0–450.0)
Transferrin: 212 mg/dL (ref 212.0–360.0)

## 2021-12-15 MED ORDER — OXYCODONE HCL 5 MG PO TABS: 5.0000 mg | ORAL_TABLET | ORAL | 0 refills | Status: DC | PRN

## 2021-12-15 NOTE — Progress Notes (Signed)
Office Visit Note   Patient: Antonio Adams           Date of Birth: Mar 18, 1946           MRN: [id number] Visit Date: 12/13/2021              Requested by: [name], MD [address] [place],  [place] [address] PCP: [name], MD   Assessment & Plan: Visit Diagnoses:  1. Low back pain without sciatica, unspecified back pain laterality, unspecified chronicity     Plan: MRI scan is reviewed.  No obvious osseous metastasis.  Changes in the endplate L3 looks like a Schmorl's node.  We reviewed with him again CT abdomen which showed mass along the adrenal gland with previous right kidney upper pole partial nephrectomy.  He can follow-up with me as needed and will follow up recommendations of his oncologist.  Follow-Up Instructions: No follow-ups on file.   Orders:  No orders of the defined types were placed in this encounter.  No orders of the defined types were placed in this encounter.     Procedures: No procedures performed   Clinical Data: No additional findings.   Subjective: Chief Complaint  Patient presents with   Lower Back - Pain    HPI 76 year old male returns post lumbar MRI for review.  He states he still having pain taking over-the-counter medications.  He has back pain denies leg pain or numbness.  Does have a history of adenocarcinoma the prostate Gleason grade 6.  COPD cardiac murmur metastatic renal cell carcinoma with changes still present on imaging scans into the adrenal gland.  CT scan showed changes consistent with likely Schmorl's node vertebral body endplate L3.  MRI scan was obtained to make sure there it was not consistent with the osseous malignancy.  Review of Systems all the systems update unchanged.   Objective: Vital Signs: BP (!) 167/88   Pulse 86   Ht '5\' 11"'$  (1.803 m)   Wt 208 lb (94.3 kg)   BMI 29.01 kg/m   Physical Exam Constitutional:      Appearance: He is well-developed.  HENT:     Head: Normocephalic  and atraumatic.     Right Ear: External ear normal.     Left Ear: External ear normal.  Eyes:     Pupils: Pupils are equal, round, and reactive to light.  Neck:     Thyroid: No thyromegaly.     Trachea: No tracheal deviation.  Cardiovascular:     Rate and Rhythm: Normal rate.  Pulmonary:     Effort: Pulmonary effort is normal.     Breath sounds: No wheezing.  Abdominal:     General: Bowel sounds are normal.     Palpations: Abdomen is soft.  Musculoskeletal:     Cervical back: Neck supple.  Skin:    General: Skin is warm and dry.     Capillary Refill: Capillary refill takes less than 2 seconds.  Neurological:     Mental Status: He is alert and oriented to person, place, and time.  Psychiatric:        Behavior: Behavior normal.        Thought Content: Thought content normal.        Judgment: Judgment normal.     Ortho Exam patient has negative straight leg raising normal heel-toe gait.  He gets from sitting standing comfortably.  Specialty Comments:  No specialty comments available.  Imaging: CLINICAL DATA:  Low back  pain. History of prostate and renal cell cancer.   EXAM: MRI LUMBAR SPINE WITHOUT CONTRAST   TECHNIQUE: Multiplanar, multisequence MR imaging of the lumbar spine was performed. No intravenous contrast was administered.   COMPARISON:  07/14/2017   FINDINGS: Segmentation:  5 lumbar type vertebrae   Alignment:  Scoliosis and L4-5 anterolisthesis that is mild.   Vertebrae: T1 hypointensity and STIR hyperintensity in the left lower aspect of the L3 body, the area of interest. By CT there is a defect in the inferior endplate with contiguous lucency that is not appreciable on this scan, CT appearance favoring Schmorl's node with reactive edema.   Discogenic endplate edema at E0-8.   No Conus medullaris and cauda equina: Conus extends to the L1-2 level. Conus and cauda equina appear normal.   Paraspinal and other soft tissues: Negative for perispinal  mass or inflammation. Retroperitoneal findings as described on prior CT.   Disc levels:   T12- L1: Unremarkable.   L1-L2: Disc bulging with right foraminal herniation impinging on the right L1 nerve root   L2-L3: Disc narrowing and bulging with bilateral inferior foraminal protrusion. Bilateral facet spurring.   L3-L4: Disc narrowing and bulging eccentric to the left. Bilateral facet spurring. No neural impingement   L4-L5: Degenerative facet spurring asymmetric to the left with anterolisthesis. The disc is narrowed and bulging with left foraminal protrusion. Left foraminal impingement   L5-S1:Disc narrowing and bulging with endplate and facet spurring. Moderate left foraminal narrowing. Patent spinal canal.   IMPRESSION: 1. Signal abnormality in the L3 body with CT appearance favoring Schmorl's node with reactive edema. No suspected osseous malignancy in the lumbar spine. 2. L1-2 right foraminal herniation with L1 impingement. 3. L4-5 left foraminal impingement.     Electronically Signed   By: Jorje Guild M.D.   On: 11/26/2021 22:41   PMFS History: Patient Active Problem List   Diagnosis Date Noted   Low back pain 12/15/2021   Chronic heart failure with preserved ejection fraction (HFpEF) ([place]) 08/12/2021   Tinea cruris 08/12/2021   Hyperkalemia 07/20/2021   Anemia due to chronic kidney disease 07/20/2021   CKD (chronic kidney disease) stage 5, GFR less than 15 ml/min (HCC) 05/24/2021   BPH (benign prostatic hyperplasia) 05/24/2021   Dental caries on smooth surface penetrating into pulp 05/20/2021   Diabetic renal disease ([place]) 05/20/2021   Dry eye syndrome of bilateral lacrimal glands 05/20/2021   Osteoarthrosis 05/20/2021   Senile nuclear sclerosis 05/20/2021   Vitamin D deficiency 05/20/2021   Vitreous degeneration, unspecified eye 05/20/2021   Intrinsic eczema 04/18/2021   Atherosclerosis of aorta ([place]) 12/30/2020   Mixed conductive and sensorineural  hearing loss of left ear with restricted hearing of right ear 09/01/2020   Tinnitus of both ears 05/20/2020   Metastatic renal cell carcinoma ([place]) 02/26/2020   Diabetic polyneuropathy associated with type 2 diabetes mellitus ([place]) 12/18/2019   Murmur, cardiac 10/08/2018   DDD (degenerative disc disease), cervical 10/08/2018   OAB (overactive bladder) 04/04/2018   Seasonal allergic rhinitis due to pollen 11/01/2016   Microalbuminuria due to type 2 diabetes mellitus ([place]) 07/27/2016   Varicose veins of both lower extremities 08/06/2015   Varicose veins of lower extremities with ulcer ([place]) 06/16/2015   Internal hemorrhoid, bleeding 08/03/2014   Post traumatic stress disorder (PTSD) 01/29/2014   Spinal stenosis of lumbar region with radiculopathy 10/30/2011    Class: Chronic   Obstructive sleep apnea 11/24/2010   Obesity 11/24/2010   Routine general medical examination at a  health care facility 11/24/2010   ADENOCARCINOMA, PROSTATE, GLEASON GRADE 6 12/21/2009   Type 2 diabetes mellitus with diabetic cataract ([place]) 03/16/2007   Essential hypertension 03/16/2007   GERD 03/16/2007   Past Medical History:  Diagnosis Date   ADENOCARCINOMA, PROSTATE, GLEASON GRADE 6 12/21/2009   prostate cancer   ALLERGIC RHINITIS 03/16/2007   Allergy    Anxiety    Arthritis    back    BACK PAIN WITH RADICULOPATHY 11/23/2008   Cancer of kidney ([place])    partial right kidney removed   CARPAL TUNNEL SYNDROME, BILATERAL 03/16/2007   Chronic back pain    Constipation    takes Senokot daily   Depression    occasionally   DIABETES MELLITUS, TYPE II 03/16/2007   takes Januvia and MEtformin daily   GASTROINTESTINAL HEMORRHAGE, HX OF 03/16/2007   GERD 03/16/2007   takes Omeprazole daily   Glaucoma    mild - no eye drops   Hemorrhoids    History of colon polyps    HYPERLIPIDEMIA 03/16/2007   takes Crestor daily   HYPERTENSION 03/16/2007   takes Diltiazem and Lisinopril daily    Kidney cancer, primary, with  metastasis from kidney to other site [facility])    LEG CRAMPS 05/22/2007   Nocturia    Overactive bladder    PARESTHESIA 05/21/2007   Proctitis    PTSD (post-traumatic stress disorder)    wakes up may be dreaming of fighting   Rectal bleeding    Dr.Norins has explained its from the Radiation that he has received   Rectal bleeding    Renal cell carcinoma ([place])    Sleep apnea    uses CPAP nightly   Stomach cancer ([place])    Urinary frequency    takes Toviaz daily    Family History  Problem Relation Age of Onset   Cancer Sister 67       Cancer, unsure type   Hypertension Other    Colon cancer Neg Hx    Esophageal cancer Neg Hx    Stomach cancer Neg Hx    Diabetes Neg Hx    Colon polyps Neg Hx    Rectal cancer Neg Hx     Past Surgical History:  Procedure Laterality Date   ADRENALECTOMY Right 02/26/2020   Procedure: OPEN RIGHT RENAL CELL METASTATECTOMY AND  ADRENALECTOMY;  Surgeon: [name], MD;  Location: [facility];  Service: Urology;  Laterality: Right;   [place], 2008   repeat surgery; ESI '08   [place]     bilateral   CERVICAL FUSION     COLONOSCOPY     colonosocpy     ESOPHAGOGASTRODUODENOSCOPY     ED   FLEXIBLE SIGMOIDOSCOPY N/A 01/08/2017   Procedure: FLEXIBLE SIGMOIDOSCOPY;  Surgeon: Irene Shipper, MD;  Location: [facility];  Service: Endoscopy;  Laterality: N/A;   HOT HEMOSTASIS N/A 01/08/2017   Procedure: HOT HEMOSTASIS (ARGON PLASMA COAGULATION/BICAP);  Surgeon: Irene Shipper, MD;  [facility];  Service: Endoscopy;  Laterality: N/A;   IR RADIOLOGIST EVAL & MGMT  12/17/2019   IR RADIOLOGIST EVAL & MGMT  07/01/2020   IR RADIOLOGIST EVAL & MGMT  08/25/2020   LUMBAR LAMINECTOMY  10/30/2011   Procedure: MICRODISCECTOMY LUMBAR LAMINECTOMY;  Surgeon: Jessy Oto, MD;  Location: [facility];  Service: Orthopedics;  Laterality: Right;  Right L4-5 and L5-S1 Microdiscectomy   LUMBAR LAMINECTOMY  06/17/2012   Procedure: MICRODISCECTOMY LUMBAR  LAMINECTOMY;  Surgeon: Jessy Oto,  MD;  Location: [facility];  Service: Orthopedics;  Laterality: N/A;  Right L5-S1 microdiscectomy   OPEN PARTIAL HEPATECTOMY  N/A 02/26/2020   Procedure: OPEN PARTIAL HEPATECTOMY, CHOLECYSTECTOMY;  Surgeon: Stark Klein, MD;  [facility];  Service: General;  Laterality: N/A;   POLYPECTOMY     PROSTATE SURGERY  march 2012   seed implant   RADIOFREQUENCY ABLATION N/A 07/21/2020   Procedure: CT MICROWAVE ABLATION;  Surgeon: Criselda Peaches, MD;  Location: [facility];  Service: Anesthesiology;  Laterality: N/A;   ROBOTIC ASSITED PARTIAL NEPHRECTOMY Right 09/08/2015   Procedure: XI ROBOTIC ASSITED RIGHT PARTIAL NEPHRECTOMY;  Surgeon: [name], MD;  [facility];  Service: Urology;  Laterality: Right;  Clamp on: 1033 Clamp off: 1103 Total Clamp Time: 30 minutes   SHOULDER ARTHROSCOPY WITH SUBACROMIAL DECOMPRESSION Left 01/20/2016   Procedure: LEFT SHOULDER ARTHROSCOPY WITH EXTENSIVE  DEBRIDEMENT, ACROMIOPLASTY;  Surgeon: Ninetta Lights, MD;  Location: [facility];  Service: Orthopedics;  Laterality: Left;   SHOULDER SURGERY  LFT   Stress Cardiolite  12/24/2003   negative for ischemia   UPPER ESOPHAGEAL ENDOSCOPIC ULTRASOUND (EUS)  04/18/2016   [facility]   UPPER GASTROINTESTINAL ENDOSCOPY     WOUND EXPLORATION     management of a wound that he sustained in the [place] History   Occupation: Event organiser; Banker: RETIRED    Comment: retired from Event organiser  Tobacco Use   Smoking status: Never   Smokeless tobacco: Never  Vaping Use   Vaping Use: Never used  Substance and Sexual Activity   Alcohol use: Not Currently    Alcohol/week: 0.0 standard drinks of alcohol   Drug use: No   Sexual activity: Not Currently    Partners: Female

## 2021-12-15 NOTE — Progress Notes (Signed)
Subjective:  Patient ID: Antonio Adams, male    DOB: 06/24/1945  Age: 76 y.o. MRN: [id number]  CC: Anemia   HPI Antonio Adams presents for f/up -  Antonio Adams was recently admitted for symptomatic anemia and got a blood transfusion.  Antonio Adams continues to complain of shortness of breath but is not aware of any sources of blood loss.  Outpatient Medications Prior to Visit  Medication Sig Dispense Refill   Apoaequorin (PREVAGEN EXTRA STRENGTH PO) Take 1 tablet by mouth daily.     ferrous sulfate 325 (65 FE) MG tablet Take 1 tablet (325 mg total) by mouth 2 (two) times daily. Patient needs office visit for further refills (Patient taking differently: Take 325 mg by mouth 2 (two) times daily.) 60 tablet 1   furosemide (LASIX) 40 MG tablet TAKE 1 TABLET BY MOUTH EVERY DAY 90 tablet 1   gabapentin (NEURONTIN) 100 MG capsule Take 100 mg by mouth at bedtime.     gemfibrozil (LOPID) 600 MG tablet Take 600 mg by mouth 2 (two) times daily before a meal.     Iron-Vitamins (GERITOL) LIQD Take 10 mLs by mouth daily.     LOKELMA 10 g PACK packet Take 10 g by mouth daily.     megestrol (MEGACE) 40 MG/ML suspension TAKE 10 MLS (400 MG TOTAL) BY MOUTH DAILY. 240 mL 0   Omega-3 Fatty Acids (FISH OIL PO) Take 1 tablet by mouth daily.     OVER THE COUNTER MEDICATION Take 1 tablet by mouth daily. Focus factor     polyvinyl alcohol (LIQUIFILM TEARS) 1.4 % ophthalmic solution Place 1 drop into both eyes daily as needed for dry eyes.     solifenacin (VESICARE) 5 MG tablet TAKE 1 TABLET (5 MG TOTAL) BY MOUTH DAILY. 90 tablet 1   tamsulosin (FLOMAX) 0.4 MG CAPS capsule TAKE 1 CAPSULE (0.4 MG TOTAL) BY MOUTH DAILY. (Patient taking differently: Take 0.4 mg by mouth daily.) 30 capsule 1   atorvastatin (LIPITOR) 20 MG tablet Take 1 tablet (20 mg total) by mouth at bedtime. 30 tablet 0   metoprolol tartrate (LOPRESSOR) 25 MG tablet Take 1 tablet (25 mg total) by mouth 2 (two) times daily. 180 tablet 3   No  facility-administered medications prior to visit.    ROS Review of Systems  Constitutional:  Positive for fatigue. Negative for diaphoresis.  HENT: Negative.    Respiratory:  Positive for shortness of breath. Negative for cough, chest tightness and wheezing.   Cardiovascular:  Negative for chest pain, palpitations and leg swelling.  Gastrointestinal:  Negative for abdominal pain, constipation, diarrhea, nausea and vomiting.  Endocrine: Negative.   Genitourinary:  Positive for flank pain. Negative for difficulty urinating.  Musculoskeletal:  Positive for back pain and neck pain.  Skin: Negative.  Negative for wound.  Neurological:  Positive for numbness. Negative for dizziness, weakness and light-headedness.  Hematological:  Negative for adenopathy. Does not bruise/bleed easily.  Psychiatric/Behavioral: Negative.      Objective:  BP 138/76 (BP Location: Left Arm, Patient Position: Sitting, Cuff Size: Large)   Pulse 79   Temp 98.5 F (36.9 C) (Oral)   Ht '5\' 11"'$  (1.803 m)   Wt 220 lb (99.8 kg)   SpO2 99%   BMI 30.68 kg/m   BP Readings from Last 3 Encounters:  12/15/21 138/76  12/13/21 (!) 167/88  11/18/21 (!) 173/92    Wt Readings from Last 3 Encounters:  12/15/21 220 lb (99.8 kg)  12/13/21 208 lb (  94.3 kg)  11/18/21 204 lb (92.5 kg)    Physical Exam Vitals reviewed.  Constitutional:      General: Antonio Adams is not in acute distress.    Appearance: Antonio Adams is ill-appearing. Antonio Adams is not toxic-appearing or diaphoretic.  Eyes:     General: No scleral icterus.    Conjunctiva/sclera: Conjunctivae normal.  Cardiovascular:     Rate and Rhythm: Normal rate and regular rhythm.     Heart sounds: Murmur heard.     No friction rub. No gallop.  Pulmonary:     Effort: Tachypnea present. No accessory muscle usage.     Breath sounds: Examination of the right-lower field reveals rales. Examination of the left-lower field reveals rales. Rales present. No decreased breath sounds, wheezing or  rhonchi.  Abdominal:     Palpations: There is no mass.     Tenderness: There is no abdominal tenderness. There is no guarding.     Hernia: No hernia is present.  Musculoskeletal:        General: Normal range of motion.     Cervical back: Neck supple.     Right lower leg: No edema.     Left lower leg: No edema.  Lymphadenopathy:     Cervical: No cervical adenopathy.  Skin:    General: Skin is warm.  Neurological:     General: No focal deficit present.     Mental Status: Antonio Adams is alert.  Psychiatric:        Mood and Affect: Mood normal.     Lab Results  Component Value Date   WBC 3.7 (L) 12/15/2021   HGB 6.8 Repeated and verified X2. (LL) 12/15/2021   HCT 20.3 Repeated and verified X2. (LL) 12/15/2021   PLT 163.0 12/15/2021   GLUCOSE 84 11/18/2021   CHOL 136 07/22/2021   TRIG 46 07/22/2021   HDL 34 (L) 07/22/2021   LDLCALC 93 07/22/2021   ALT 17 11/18/2021   AST 28 11/18/2021   NA 138 11/18/2021   K 4.3 11/18/2021   CL 110 11/18/2021   CREATININE 6.39 (H) 11/18/2021   BUN 70 (H) 11/18/2021   CO2 12 (L) 11/18/2021   TSH 2.577 10/04/2021   PSA 0.01 (L) 05/20/2020   INR 1.2 05/24/2021   HGBA1C 5.8 (H) 05/23/2021   MICROALBUR 97.0 12/18/2019    MR Lumbar Spine w/o contrast  Result Date: 11/26/2021 CLINICAL DATA:  Low back pain. History of prostate and renal cell cancer. EXAM: MRI LUMBAR SPINE WITHOUT CONTRAST TECHNIQUE: Multiplanar, multisequence MR imaging of the lumbar spine was performed. No intravenous contrast was administered. COMPARISON:  07/14/2017 FINDINGS: Segmentation:  5 lumbar type vertebrae Alignment:  Scoliosis and L4-5 anterolisthesis that is mild. Vertebrae: T1 hypointensity and STIR hyperintensity in the left lower aspect of the L3 body, the area of interest. By CT there is a defect in the inferior endplate with contiguous lucency that is not appreciable on this scan, CT appearance favoring Schmorl's node with reactive edema. Discogenic endplate edema at  N6-2. No Conus medullaris and cauda equina: Conus extends to the L1-2 level. Conus and cauda equina appear normal. Paraspinal and other soft tissues: Negative for perispinal mass or inflammation. Retroperitoneal findings as described on prior CT. Disc levels: T12- L1: Unremarkable. L1-L2: Disc bulging with right foraminal herniation impinging on the right L1 nerve root L2-L3: Disc narrowing and bulging with bilateral inferior foraminal protrusion. Bilateral facet spurring. L3-L4: Disc narrowing and bulging eccentric to the left. Bilateral facet spurring. No neural impingement L4-L5: Degenerative  facet spurring asymmetric to the left with anterolisthesis. The disc is narrowed and bulging with left foraminal protrusion. Left foraminal impingement L5-S1:Disc narrowing and bulging with endplate and facet spurring. Moderate left foraminal narrowing. Patent spinal canal. IMPRESSION: 1. Signal abnormality in the L3 body with CT appearance favoring Schmorl's node with reactive edema. No suspected osseous malignancy in the lumbar spine. 2. L1-2 right foraminal herniation with L1 impingement. 3. L4-5 left foraminal impingement. Electronically Signed   By: Jorje Guild M.D.   On: 11/26/2021 22:41    Assessment & Plan:   Consuelo was seen today for anemia.  Diagnoses and all orders for this visit:  Symptomatic anemia- His anemia has slightly worsened and Antonio Adams is symptomatic.  I offered him the opportunity of another admission for transfusion but Antonio Adams deferred on that.  Will screen for vitamin deficiencies.  I recommended that Antonio Adams talk to his nephrologist about the possibility of getting an epogen injection. -     IBC + Ferritin; Future -     Reticulocytes; Future -     CBC with Differential/Platelet; Future -     Vitamin B1; Future -     Zinc; Future -     Folate; Future -     Vitamin B12; Future -     Vitamin B12 -     Folate -     Zinc -     Vitamin B1 -     CBC with Differential/Platelet -      Reticulocytes -     IBC + Ferritin  Spinal stenosis of lumbar region with radiculopathy -     oxyCODONE (OXY IR/ROXICODONE) 5 MG immediate release tablet; Take 1 tablet (5 mg total) by mouth every 4 (four) hours as needed for severe pain.  DDD (degenerative disc disease), cervical -     oxyCODONE (OXY IR/ROXICODONE) 5 MG immediate release tablet; Take 1 tablet (5 mg total) by mouth every 4 (four) hours as needed for severe pain.  Renal cell carcinoma of right kidney metastatic to other site (HCC) -     oxyCODONE (OXY IR/ROXICODONE) 5 MG immediate release tablet; Take 1 tablet (5 mg total) by mouth every 4 (four) hours as needed for severe pain.  Cancer related pain -     oxyCODONE (OXY IR/ROXICODONE) 5 MG immediate release tablet; Take 1 tablet (5 mg total) by mouth every 4 (four) hours as needed for severe pain.   I am having Otila Back. Johnnye Sima start on oxyCODONE. I am also having him maintain his tamsulosin, ferrous sulfate, gabapentin, gemfibrozil, polyvinyl alcohol, Omega-3 Fatty Acids (FISH OIL PO), OVER THE COUNTER MEDICATION, Lokelma, atorvastatin, Apoaequorin (PREVAGEN EXTRA STRENGTH PO), Geritol, metoprolol tartrate, furosemide, solifenacin, and megestrol.  Meds ordered this encounter  Medications   oxyCODONE (OXY IR/ROXICODONE) 5 MG immediate release tablet    Sig: Take 1 tablet (5 mg total) by mouth every 4 (four) hours as needed for severe pain.    Dispense:  90 tablet    Refill:  0     Follow-up: Return in about 3 months (around 03/17/2022).  Scarlette Calico, MD

## 2021-12-15 NOTE — Patient Instructions (Signed)

## 2021-12-16 NOTE — Telephone Encounter (Signed)
Chart Review

## 2021-12-20 ENCOUNTER — Other Ambulatory Visit: Payer: Self-pay | Admitting: Internal Medicine

## 2021-12-20 DIAGNOSIS — D538 Other specified nutritional anemias: Secondary | ICD-10-CM | POA: Insufficient documentation

## 2021-12-20 LAB — VITAMIN B1: Vitamin B1 (Thiamine): 43 nmol/L — ABNORMAL HIGH (ref 8–30)

## 2021-12-20 LAB — EXTRA SPECIMEN

## 2021-12-20 LAB — ZINC: Zinc: 54 ug/dL — ABNORMAL LOW (ref 60–130)

## 2021-12-20 LAB — RETICULOCYTES
ABS Retic: 32850 {cells}/uL (ref 25000–90000)
Retic Ct Pct: 1.5 %

## 2021-12-20 MED ORDER — ZINC GLUCONATE 50 MG PO TABS: 50.0000 mg | ORAL_TABLET | Freq: Every day | ORAL | 1 refills | Status: DC

## 2021-12-27 ENCOUNTER — Telehealth: Payer: Self-pay | Admitting: Internal Medicine

## 2021-12-27 NOTE — Telephone Encounter (Signed)
LVM for pt to rtn my call to schedule AWV with [facility] call back # [phone] 

## 2021-12-28 ENCOUNTER — Other Ambulatory Visit: Payer: Self-pay | Admitting: Oncology

## 2022-01-03 ENCOUNTER — Ambulatory Visit: Payer: Medicare Other | Admitting: *Deleted

## 2022-01-03 DIAGNOSIS — D49511 Neoplasm of unspecified behavior of right kidney: Secondary | ICD-10-CM | POA: Diagnosis not present

## 2022-01-03 DIAGNOSIS — N185 Chronic kidney disease, stage 5: Secondary | ICD-10-CM

## 2022-01-03 DIAGNOSIS — R1031 Right lower quadrant pain: Secondary | ICD-10-CM | POA: Diagnosis not present

## 2022-01-03 DIAGNOSIS — E1136 Type 2 diabetes mellitus with diabetic cataract: Secondary | ICD-10-CM

## 2022-01-03 DIAGNOSIS — D538 Other specified nutritional anemias: Secondary | ICD-10-CM

## 2022-01-03 NOTE — Chronic Care Management (AMB) (Signed)
Care Management    RN Visit Note  01/03/2022 Name: Antonio Adams MRN: [id number] DOB: 1945-08-11  Subjective: Antonio Adams is a 76 y.o. year old male who is a primary care patient of [name], MD. The care management team was consulted for assistance with disease management and care coordination needs.    Engaged with patient by telephone for follow up visit/ RN CM case closure in response to provider referral for case management and/or care coordination services.   Consent to Services:   [name] was given information about Care Management services 08/02/20 including:  Care Management services includes personalized support from designated clinical staff supervised by his physician, including individualized plan of care and coordination with other care providers 24/7 contact phone numbers for assistance for urgent and routine care needs. The patient may stop case management services at any time by phone call to the office staff.  Patient agreed to services and consent obtained.   Assessment: Review of patient past medical history, allergies, medications, health status, including review of consultants reports, laboratory and other test data, was performed as part of comprehensive evaluation and provision of chronic care management services.   SDOH (Social Determinants of Health) assessments and interventions performed:  SDOH Interventions    Flowsheet Row Most Recent Value  SDOH Interventions   Food Insecurity Interventions Intervention Not Indicated  Transportation Interventions Intervention Not Indicated  [continues to drive self]     Care Plan  Allergies  Allergen Reactions   Ace Inhibitors Swelling    lisinopril   Xyzal [Levocetirizine] Other (See Comments)    Break out   Opdivo [Nivolumab] Rash   Outpatient Encounter Medications as of 01/03/2022  Medication Sig   Apoaequorin (PREVAGEN EXTRA STRENGTH PO) Take 1 tablet by mouth daily.    atorvastatin (LIPITOR) 20 MG tablet Take 1 tablet (20 mg total) by mouth at bedtime.   ferrous sulfate 325 (65 FE) MG tablet Take 1 tablet (325 mg total) by mouth 2 (two) times daily. Patient needs office visit for further refills (Patient taking differently: Take 325 mg by mouth 2 (two) times daily.)   furosemide (LASIX) 40 MG tablet TAKE 1 TABLET BY MOUTH EVERY DAY   gabapentin (NEURONTIN) 100 MG capsule Take 100 mg by mouth at bedtime.   gemfibrozil (LOPID) 600 MG tablet Take 600 mg by mouth 2 (two) times daily before a meal.   Iron-Vitamins (GERITOL) LIQD Take 10 mLs by mouth daily.   LOKELMA 10 g PACK packet Take 10 g by mouth daily.   megestrol (MEGACE) 40 MG/ML suspension TAKE 10 MLS (400 MG TOTAL) BY MOUTH DAILY.   metoprolol tartrate (LOPRESSOR) 25 MG tablet Take 1 tablet (25 mg total) by mouth 2 (two) times daily.   Omega-3 Fatty Acids (FISH OIL PO) Take 1 tablet by mouth daily.   OVER THE COUNTER MEDICATION Take 1 tablet by mouth daily. Focus factor   oxyCODONE (OXY IR/ROXICODONE) 5 MG immediate release tablet Take 1 tablet (5 mg total) by mouth every 4 (four) hours as needed for severe pain.   polyvinyl alcohol (LIQUIFILM TEARS) 1.4 % ophthalmic solution Place 1 drop into both eyes daily as needed for dry eyes.   solifenacin (VESICARE) 5 MG tablet TAKE 1 TABLET (5 MG TOTAL) BY MOUTH DAILY.   tamsulosin (FLOMAX) 0.4 MG CAPS capsule TAKE 1 CAPSULE (0.4 MG TOTAL) BY MOUTH DAILY. (Patient taking differently: Take 0.4 mg by mouth daily.)   zinc gluconate 50 MG tablet Take  1 tablet (50 mg total) by mouth daily.   No facility-administered encounter medications on file as of 01/03/2022.   Patient Active Problem List   Diagnosis Date Noted   Anemia due to zinc deficiency 12/20/2021   Low back pain 12/15/2021   Symptomatic anemia 12/15/2021   Cancer related pain 12/15/2021   Chronic heart failure with preserved ejection fraction (HFpEF) ([place]) 08/12/2021   Tinea cruris 08/12/2021   Anemia  due to chronic kidney disease 07/20/2021   CKD (chronic kidney disease) stage 5, GFR less than 15 ml/min (HCC) 05/24/2021   BPH (benign prostatic hyperplasia) 05/24/2021   Diabetic renal disease ([place]) 05/20/2021   Dry eye syndrome of bilateral lacrimal glands 05/20/2021   Senile nuclear sclerosis 05/20/2021   Vitamin D deficiency 05/20/2021   Vitreous degeneration, unspecified eye 05/20/2021   Intrinsic eczema 04/18/2021   Atherosclerosis of aorta ([place]) 12/30/2020   Mixed conductive and sensorineural hearing loss of left ear with restricted hearing of right ear 09/01/2020   Tinnitus of both ears 05/20/2020   Metastatic renal cell carcinoma ([place]) 02/26/2020   Diabetic polyneuropathy associated with type 2 diabetes mellitus ([place]) 12/18/2019   Murmur, cardiac 10/08/2018   DDD (degenerative disc disease), cervical 10/08/2018   OAB (overactive bladder) 04/04/2018   Seasonal allergic rhinitis due to pollen 11/01/2016   Microalbuminuria due to type 2 diabetes mellitus ([place]) 07/27/2016   Varicose veins of both lower extremities 08/06/2015   Varicose veins of lower extremities with ulcer ([place]) 06/16/2015   Internal hemorrhoid, bleeding 08/03/2014   Post traumatic stress disorder (PTSD) 01/29/2014   Spinal stenosis of lumbar region with radiculopathy 10/30/2011    Class: Chronic   Obstructive sleep apnea 11/24/2010   Obesity 11/24/2010   Routine general medical examination at a health care facility 11/24/2010   ADENOCARCINOMA, PROSTATE, GLEASON GRADE 6 12/21/2009   Type 2 diabetes mellitus with diabetic cataract ([place]) 03/16/2007   Essential hypertension 03/16/2007   GERD 03/16/2007   Conditions to be addressed/monitored: HTN, DMII, and CKD Stage III- IV  Care Plan : RN Care Manager Plan of Care  Updates made by Knox Royalty, RN since 01/03/2022 12:00 AM     Problem: Chronic Disease Management Needs   Priority: Medium     Long-Range Goal: Ongoing adherence to established plan of  care for long term chronic disease management   Start Date: 02/07/2021  Expected End Date: 02/07/2022  Priority: Medium  Note:   Current Barriers:  Chronic Disease Management support and education needs related to HTN and DMII Hospitalization January 9-15, 2023 for dehydration due to N/V/D: discharged with home health services through [facility] Hospitalization March 8-10, 2023 for hyperkalemia; patient diagnosed with CHF during this visit; was discharged home to self-care  RNCM Clinical Goal(s): Care plan goals re-established and extended 02/07/21: Patient will demonstrate ongoing health management independence for HTN, DMII  through collaboration with RN Care manager, provider, and care team.   Interventions: 1:1 collaboration with primary care provider regarding development and update of comprehensive plan of care as evidenced by provider attestation and co-signature Inter-disciplinary care team collaboration (see longitudinal plan of care) Evaluation of current treatment plan related to  self management and patient's adherence to plan as established by provider Review of patient status, including review of consultants reports, relevant laboratory and other test results, and medications completed SDOH updated: no new/ unmet concerns identified Falls assessment updated: continues to deny new/ recent falls x 12 months- continues using cane as indicated/ needed;  positive reinforcement  provided with encouragement to continue efforts at fall prevention; previously provided education around fall risks/ prevention reinforced Medications discussed: reports continues to independently self-manage and denies current concerns/ issues/ questions around medications; endorses adherence to taking all medications as prescribed Confirms has started taking zinc as advised post- recent PCP office visit 12/15/21 Reviewed recent ED visit for anemia: patient was sent from renal provider office visit to ED for blood  transfusion for low Hgb/ Hct: discussed signs/ symptoms anemia/ bleeding along with corresponding action plan- patient has good understanding of same; states, "I felt fine, I felt normal" when told to go to ER for blood transfusion; encouraged ongoing regular lab work; encouraged patient to discuss possibility of epogen injections with renal provider at time of next office visit, as recommended by PCP Reviewed upcoming scheduled provider appointments: 01/03/22: urology provider; 01/06/22- AWV/ PCP office telephone visit; 04/14/22 and 04/20/22- oncology provider; patient confirms is aware of all and has plans to attend as scheduled Discussed plans with patient for ongoing care management follow up- patient denies current care coordination/ care management needs and is agreeable to RN CM case closure today; verbalizes understanding to contact PCP or other care providers for any needs that arise in the future, and confirms he has contact information for all care providers     Diabetes:  (Status: 01/03/22: Goal Met.) Long Term Goal Lab Results  Component Value Date   HGBA1C 5.8 (H) 05/23/2021  Provided education to patient about basic DM disease process; Confirmed no concerns around signs/ symptoms low blood sugars- he reports ongoing home monitoring/ recording of fasting blood sugars "several times each week;" reports general ranges betweem 90-110 with a value of "97" this morning Reinforced previously provided education around signs/ symptoms and risks of low blood sugars along with corresponding action plan for same- he verbalizes good understanding of same Confirmed patient continues trying to follow DMII/ heart healthy diet- positive reinforcement provided with encouragement to continue efforts Confirmed patient is primarily diet controlled diabetic; confirmed he is not taking medications for DMII  Heart Failure/ HTN Interventions:  (Status: 01/03/22: Goal Met.) Long Term Goal Basic overview and  discussion of pathophysiology of Heart Failure reviewed Provided education on low sodium diet Discussed importance of daily weight and advised patient to weigh and record daily Reviewed role of diuretics in prevention of fluid overload and management of heart failure; Discussed the importance of keeping all appointments with provider  Patient reports he has been monitoring/ recording daily weights at home since our last outreach: confirms no overnight weight gain > 3 lbs; confirms no signs/ symptoms yellow CHF zone; patient reports feeling occasionally short of breath with activity/ walking- states he continues to "just rests for a few minutes" which "completely" resolves this symptom       Reinforced previously provided education around general guidelines for action plan for daily and/or weekly weight gain-  Confirmed patient now taking diuretic as prescribed "twice a day" Evaluation of current treatment plan related to hypertension self management and patient's adherence to plan as established by provider  Discussed complications of poorly controlled blood pressure such as heart disease, stroke, circulatory complications, vision complications, kidney impairment, sexual dysfunction  Last practice recorded BP readings:  BP Readings from Last 3 Encounters:  12/15/21 138/76  12/13/21 (!) 167/88  11/18/21 (!) 173/92  Most recent eGFR/CrCl: No results found for: EGFR  No components found for: CRCL      Plan:  No further follow up required: patient denies current care  coordination/ care management needs and is agreeable to RN CM case closure today; RN CM case closure accordingly    Oneta Rack, RN, BSN, [place] [phone]: direct office

## 2022-01-06 ENCOUNTER — Ambulatory Visit (INDEPENDENT_AMBULATORY_CARE_PROVIDER_SITE_OTHER): Payer: Medicare Other

## 2022-01-06 VITALS — Ht 71.0 in | Wt 202.0 lb

## 2022-01-06 DIAGNOSIS — Z0000 Encounter for general adult medical examination without abnormal findings: Secondary | ICD-10-CM

## 2022-01-06 NOTE — Patient Instructions (Signed)
Antonio Adams , Thank you for taking time to come for your Medicare Wellness Visit. I appreciate your ongoing commitment to your health goals. Please review the following plan we discussed and let me know if I can assist you in the future.   Screening recommendations/referrals: Colonoscopy: no longer required  Recommended yearly ophthalmology/optometry visit for glaucoma screening and checkup Recommended yearly dental visit for hygiene and checkup  Vaccinations: Influenza vaccine: completed  Pneumococcal vaccine: completed  Tdap vaccine: due  Shingles vaccine: will consider    Covid-19: completed   Advanced directives:  Yes  Conditions/risks identified: Will obtain vaccine records from [place]  Aim for 30 minutes of exercise or brisk walking, 6-8 glasses of water, and 5 servings of fruits and vegetables each day.   Next appointment: Follow up in one year for your annual wellness visit.   Preventive Care 72 Years and Older, Male  Preventive care refers to lifestyle choices and visits with your health care provider that can promote health and wellness. What does preventive care include? A yearly physical exam. This is also called an annual well check. Dental exams once or twice a year. Routine eye exams. Ask your health care provider how often you should have your eyes checked. Personal lifestyle choices, including: Daily care of your teeth and gums. Regular physical activity. Eating a healthy diet. Avoiding tobacco and drug use. Limiting alcohol use. Practicing safe sex. Taking low doses of aspirin every day. Taking vitamin and mineral supplements as recommended by your health care provider. What happens during an annual well check? The services and screenings done by your health care provider during your annual well check will depend on your age, overall health, lifestyle risk factors, and family history of disease. Counseling  Your health care provider may ask you questions about  your: Alcohol use. Tobacco use. Drug use. Emotional well-being. Home and relationship well-being. Sexual activity. Eating habits. History of falls. Memory and ability to understand (cognition). Work and work Statistician. Screening  You may have the following tests or measurements: Height, weight, and BMI. Blood pressure. Lipid and cholesterol levels. These may be checked every 5 years, or more frequently if you are over 90 years old. Skin check. Lung cancer screening. You may have this screening every year starting at age 12 if you have a 30-pack-year history of smoking and currently smoke or have quit within the past 15 years. Fecal occult blood test (FOBT) of the stool. You may have this test every year starting at age 78. Flexible sigmoidoscopy or colonoscopy. You may have a sigmoidoscopy every 5 years or a colonoscopy every 10 years starting at age 44. Prostate cancer screening. Recommendations will vary depending on your family history and other risks. Hepatitis C blood test. Hepatitis B blood test. Sexually transmitted disease (STD) testing. Diabetes screening. This is done by checking your blood sugar (glucose) after you have not eaten for a while (fasting). You may have this done every 1-3 years. Abdominal aortic aneurysm (AAA) screening. You may need this if you are a current or former smoker. Osteoporosis. You may be screened starting at age 57 if you are at high risk. Talk with your health care provider about your test results, treatment options, and if necessary, the need for more tests. Vaccines  Your health care provider may recommend certain vaccines, such as: Influenza vaccine. This is recommended every year. Tetanus, diphtheria, and acellular pertussis (Tdap, Td) vaccine. You may need a Td booster every 10 years. Zoster vaccine. You may  need this after age 69. Pneumococcal 13-valent conjugate (PCV13) vaccine. One dose is recommended after age 42. Pneumococcal  polysaccharide (PPSV23) vaccine. One dose is recommended after age 54. Talk to your health care provider about which screenings and vaccines you need and how often you need them. This information is not intended to replace advice given to you by your health care provider. Make sure you discuss any questions you have with your health care provider. Document Released: 05/28/2015 Document Revised: 01/19/2016 Document Reviewed: 03/02/2015 Elsevier Interactive Patient Education  2017 [place] Prevention in the Home Falls can cause injuries. They can happen to people of all ages. There are many things you can do to make your home safe and to help prevent falls. What can I do on the outside of my home? Regularly fix the edges of walkways and driveways and fix any cracks. Remove anything that might make you trip as you walk through a door, such as a raised step or threshold. Trim any bushes or trees on the path to your home. Use bright outdoor lighting. Clear any walking paths of anything that might make someone trip, such as rocks or tools. Regularly check to see if handrails are loose or broken. Make sure that both sides of any steps have handrails. Any raised decks and porches should have guardrails on the edges. Have any leaves, snow, or ice cleared regularly. Use sand or salt on walking paths during winter. Clean up any spills in your garage right away. This includes oil or grease spills. What can I do in the bathroom? Use night lights. Install grab bars by the toilet and in the tub and shower. Do not use towel bars as grab bars. Use non-skid mats or decals in the tub or shower. If you need to sit down in the shower, use a plastic, non-slip stool. Keep the floor dry. Clean up any water that spills on the floor as soon as it happens. Remove soap buildup in the tub or shower regularly. Attach bath mats securely with double-sided non-slip rug tape. Do not have throw rugs and other  things on the floor that can make you trip. What can I do in the bedroom? Use night lights. Make sure that you have a light by your bed that is easy to reach. Do not use any sheets or blankets that are too big for your bed. They should not hang down onto the floor. Have a firm chair that has side arms. You can use this for support while you get dressed. Do not have throw rugs and other things on the floor that can make you trip. What can I do in the kitchen? Clean up any spills right away. Avoid walking on wet floors. Keep items that you use a lot in easy-to-reach places. If you need to reach something above you, use a strong step stool that has a grab bar. Keep electrical cords out of the way. Do not use floor polish or wax that makes floors slippery. If you must use wax, use non-skid floor wax. Do not have throw rugs and other things on the floor that can make you trip. What can I do with my stairs? Do not leave any items on the stairs. Make sure that there are handrails on both sides of the stairs and use them. Fix handrails that are broken or loose. Make sure that handrails are as long as the stairways. Check any carpeting to make sure that it is firmly attached  to the stairs. Fix any carpet that is loose or worn. Avoid having throw rugs at the top or bottom of the stairs. If you do have throw rugs, attach them to the floor with carpet tape. Make sure that you have a light switch at the top of the stairs and the bottom of the stairs. If you do not have them, ask someone to add them for you. What else can I do to help prevent falls? Wear shoes that: Do not have high heels. Have rubber bottoms. Are comfortable and fit you well. Are closed at the toe. Do not wear sandals. If you use a stepladder: Make sure that it is fully opened. Do not climb a closed stepladder. Make sure that both sides of the stepladder are locked into place. Ask someone to hold it for you, if possible. Clearly  mark and make sure that you can see: Any grab bars or handrails. First and last steps. Where the edge of each step is. Use tools that help you move around (mobility aids) if they are needed. These include: Canes. Walkers. Scooters. Crutches. Turn on the lights when you go into a dark area. Replace any light bulbs as soon as they burn out. Set up your furniture so you have a clear path. Avoid moving your furniture around. If any of your floors are uneven, fix them. If there are any pets around you, be aware of where they are. Review your medicines with your doctor. Some medicines can make you feel dizzy. This can increase your chance of falling. Ask your doctor what other things that you can do to help prevent falls. This information is not intended to replace advice given to you by your health care provider. Make sure you discuss any questions you have with your health care provider. Document Released: 02/25/2009 Document Revised: 10/07/2015 Document Reviewed: 06/05/2014 Elsevier Interactive Patient Education  2017 [employer].

## 2022-01-06 NOTE — Progress Notes (Signed)
Subjective:   SHAWNTA Adams is a 76 y.o. male who presents for Medicare Annual/Subsequent preventive examination. Virtual Visit via Telephone Note  I connected with  Antonio Adams on 01/06/22 at  1:00 PM EDT by telephone and verified that I am speaking with the correct person using two identifiers.  Location: Patient: home  Provider: [place]  Persons participating in the virtual visit: patient/Nurse Health Advisor   I discussed the limitations, risks, security and privacy concerns of performing an evaluation and management service by telephone and the availability of in person appointments. The patient expressed understanding and agreed to proceed.  Interactive audio and video telecommunications were attempted between this nurse and patient, however failed, due to patient having technical difficulties OR patient did not have access to video capability.  We continued and completed visit with audio only.  Some vital signs may be absent or patient reported.   Daphane Shepherd, LPN  Review of Systems     Cardiac Risk Factors include: advanced age (>29mn, >>71women);diabetes mellitus;dyslipidemia;hypertension     Objective:    Today's Vitals   01/06/22 1302  Weight: 202 lb (91.6 kg)  Height: '5\' 11"'$  (1.803 m)   Body mass index is 28.17 kg/m.     01/06/2022    1:10 PM 09/22/2021    7:05 PM 07/20/2021    8:21 PM 05/23/2021    3:51 AM 05/20/2021    8:41 PM 05/16/2021    5:15 PM 03/22/2021   12:57 PM  Advanced Directives  Does Patient Have a Medical Advance Directive? Yes No No No No No No  Type of [place]        Copy of [place] Chart? No - copy requested        Would patient like information on creating a medical advance directive?   No - Patient declined No - Patient declined No - Patient declined No - Patient declined No - Patient declined    Current Medications (verified) Outpatient  Encounter Medications as of 01/06/2022  Medication Sig   Apoaequorin (PREVAGEN EXTRA STRENGTH PO) Take 1 tablet by mouth daily.   ferrous sulfate 325 (65 FE) MG tablet Take 1 tablet (325 mg total) by mouth 2 (two) times daily. Patient needs office visit for further refills (Patient taking differently: Take 325 mg by mouth 2 (two) times daily.)   furosemide (LASIX) 40 MG tablet TAKE 1 TABLET BY MOUTH EVERY DAY   gabapentin (NEURONTIN) 100 MG capsule Take 100 mg by mouth at bedtime.   Iron-Vitamins (GERITOL) LIQD Take 10 mLs by mouth daily.   LOKELMA 10 g PACK packet Take 10 g by mouth daily.   megestrol (MEGACE) 40 MG/ML suspension TAKE 10 MLS (400 MG TOTAL) BY MOUTH DAILY.   Omega-3 Fatty Acids (FISH OIL PO) Take 1 tablet by mouth daily.   OVER THE COUNTER MEDICATION Take 1 tablet by mouth daily. Focus factor   polyvinyl alcohol (LIQUIFILM TEARS) 1.4 % ophthalmic solution Place 1 drop into both eyes daily as needed for dry eyes.   solifenacin (VESICARE) 5 MG tablet TAKE 1 TABLET (5 MG TOTAL) BY MOUTH DAILY.   tamsulosin (FLOMAX) 0.4 MG CAPS capsule TAKE 1 CAPSULE (0.4 MG TOTAL) BY MOUTH DAILY. (Patient taking differently: Take 0.4 mg by mouth daily.)   zinc gluconate 50 MG tablet Take 1 tablet (50 mg total) by mouth daily.   atorvastatin (LIPITOR) 20 MG tablet Take 1 tablet (  20 mg total) by mouth at bedtime.   gemfibrozil (LOPID) 600 MG tablet Take 600 mg by mouth 2 (two) times daily before a meal. (Patient not taking: Reported on 01/06/2022)   metoprolol tartrate (LOPRESSOR) 25 MG tablet Take 1 tablet (25 mg total) by mouth 2 (two) times daily.   oxyCODONE (OXY IR/ROXICODONE) 5 MG immediate release tablet Take 1 tablet (5 mg total) by mouth every 4 (four) hours as needed for severe pain. (Patient not taking: Reported on 01/06/2022)   No facility-administered encounter medications on file as of 01/06/2022.    Allergies (verified) Ace inhibitors, Xyzal [levocetirizine], and Opdivo [nivolumab]    History: Past Medical History:  Diagnosis Date   ADENOCARCINOMA, PROSTATE, GLEASON GRADE 6 12/21/2009   prostate cancer   ALLERGIC RHINITIS 03/16/2007   Allergy    Anxiety    Arthritis    back    BACK PAIN WITH RADICULOPATHY 11/23/2008   Cancer of kidney ([place])    partial right kidney removed   CARPAL TUNNEL SYNDROME, BILATERAL 03/16/2007   Chronic back pain    Constipation    takes Senokot daily   Depression    occasionally   DIABETES MELLITUS, TYPE II 03/16/2007   takes Januvia and MEtformin daily   GASTROINTESTINAL HEMORRHAGE, HX OF 03/16/2007   GERD 03/16/2007   takes Omeprazole daily   Glaucoma    mild - no eye drops   Hemorrhoids    History of colon polyps    HYPERLIPIDEMIA 03/16/2007   takes Crestor daily   HYPERTENSION 03/16/2007   takes Diltiazem and Lisinopril daily    Kidney cancer, primary, with metastasis from kidney to other site [facility])    LEG CRAMPS 05/22/2007   Nocturia    Overactive bladder    PARESTHESIA 05/21/2007   Proctitis    PTSD (post-traumatic stress disorder)    wakes up may be dreaming of fighting   Rectal bleeding    Dr.Norins has explained its from the Radiation that he has received   Rectal bleeding    Renal cell carcinoma ([place])    Sleep apnea    uses CPAP nightly   Stomach cancer ([place])    Urinary frequency    takes Toviaz daily   Past Surgical History:  Procedure Laterality Date   ADRENALECTOMY Right 02/26/2020   Procedure: OPEN RIGHT RENAL CELL METASTATECTOMY AND  ADRENALECTOMY;  Surgeon: [name], MD;  Location: [facility];  Service: Urology;  Laterality: Right;   [place], 2008   repeat surgery; ESI '08   [place]     bilateral   CERVICAL FUSION     COLONOSCOPY     colonosocpy     ESOPHAGOGASTRODUODENOSCOPY     ED   FLEXIBLE SIGMOIDOSCOPY N/A 01/08/2017   Procedure: FLEXIBLE SIGMOIDOSCOPY;  Surgeon: Irene Shipper, MD;  Location: [place];  Service: Endoscopy;  Laterality: N/A;   HOT HEMOSTASIS N/A  01/08/2017   Procedure: HOT HEMOSTASIS (ARGON PLASMA COAGULATION/BICAP);  Surgeon: Irene Shipper, MD;  [facility];  Service: Endoscopy;  Laterality: N/A;   IR RADIOLOGIST EVAL & MGMT  12/17/2019   IR RADIOLOGIST EVAL & MGMT  07/01/2020   IR RADIOLOGIST EVAL & MGMT  08/25/2020   LUMBAR LAMINECTOMY  10/30/2011   Procedure: MICRODISCECTOMY LUMBAR LAMINECTOMY;  Surgeon: Jessy Oto, MD;  Location: [place];  Service: Orthopedics;  Laterality: Right;  Right L4-5 and L5-S1 Microdiscectomy   LUMBAR LAMINECTOMY  06/17/2012   Procedure: MICRODISCECTOMY LUMBAR LAMINECTOMY;  Surgeon: Jessy Oto, MD;  Location: [place];  Service: Orthopedics;  Laterality: N/A;  Right L5-S1 microdiscectomy   OPEN PARTIAL HEPATECTOMY  N/A 02/26/2020   Procedure: OPEN PARTIAL HEPATECTOMY, CHOLECYSTECTOMY;  Surgeon: Stark Klein, MD;  [facility];  Service: General;  Laterality: N/A;   POLYPECTOMY     PROSTATE SURGERY  march 2012   seed implant   RADIOFREQUENCY ABLATION N/A 07/21/2020   Procedure: CT MICROWAVE ABLATION;  Surgeon: Criselda Peaches, MD;  [facility];  Service: Anesthesiology;  Laterality: N/A;   ROBOTIC ASSITED PARTIAL NEPHRECTOMY Right 09/08/2015   Procedure: XI ROBOTIC ASSITED RIGHT PARTIAL NEPHRECTOMY;  Surgeon: [name], MD;  [facility];  Service: Urology;  Laterality: Right;  Clamp on: 1033 Clamp off: 1103 Total Clamp Time: 30 minutes   SHOULDER ARTHROSCOPY WITH SUBACROMIAL DECOMPRESSION Left 01/20/2016   Procedure: LEFT SHOULDER ARTHROSCOPY WITH EXTENSIVE  DEBRIDEMENT, ACROMIOPLASTY;  Surgeon: Ninetta Lights, MD;  Location: [place];  Service: Orthopedics;  Laterality: Left;   SHOULDER SURGERY  LFT   Stress Cardiolite  12/24/2003   negative for ischemia   UPPER ESOPHAGEAL ENDOSCOPIC ULTRASOUND (EUS)  04/18/2016   [facility]   UPPER GASTROINTESTINAL ENDOSCOPY     WOUND EXPLORATION     management of a wound that he sustained in the [place]   Family  History  Problem Relation Age of Onset   Cancer Sister 60       Cancer, unsure type   Hypertension Other    Colon cancer Neg Hx    Esophageal cancer Neg Hx    Stomach cancer Neg Hx    Diabetes Neg Hx    Colon polyps Neg Hx    Rectal cancer Neg Hx    Social History   Socioeconomic History   Marital status: Widowed    Spouse name: Deneise Lever   Number of children: 3   Years of education: 12+   Highest education level: Not on file  Occupational History   Occupation: Event organiser; Banker: RETIRED    Comment: retired from Event organiser  Tobacco Use   Smoking status: Never   Smokeless tobacco: Never  Vaping Use   Vaping Use: Never used  Substance and Sexual Activity   Alcohol use: Not Currently    Alcohol/week: 0.0 standard drinks of alcohol   Drug use: No   Sexual activity: Not Currently    Partners: Female  Other Topics Concern   Not on file  Social History Narrative   Married-divorced '89, remarried '96. 2 sons, 1 daughter. 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 He wishes to be a full resuscitation candidate.       Social Determinants of Health   Financial Resource Strain: Low Risk  (01/06/2022)   Overall Financial Resource Strain (CARDIA)    Difficulty of Paying Living Expenses: Not hard at all  Food Insecurity: No Food Insecurity (01/06/2022)   Hunger Vital Sign    Worried About Running Out of Food in the Last Year: Never true    Ran Out of Food in the Last Year: Never true  Transportation Needs: No Transportation Needs (01/06/2022)   PRAPARE - Hydrologist (Medical): No    Lack of Transportation (Non-Medical): No  Physical Activity: Inactive (01/06/2022)   Exercise Vital Sign    Days of Exercise per Week: 0 days    Minutes of Exercise  per Session: 0 min  Stress: No Stress Concern Present (01/06/2022)   [place]    Feeling of Stress : Not at all  Social Connections: Moderately Integrated (01/06/2022)   Social Connection and Isolation Panel [NHANES]    Frequency of Communication with Friends and Family: More than three times a week    Frequency of Social Gatherings with Friends and Family: More than three times a week    Attends Religious Services: Never    Marine scientist or Organizations: Yes    Attends Music therapist: More than 4 times per year    Marital Status: Living with partner    Tobacco Counseling Counseling given: Not Answered   Clinical Intake:  Pre-visit preparation completed: Yes  Pain : No/denies pain     Diabetes: Yes CBG done?: No Did pt. bring in CBG monitor from home?: No  How often do you need to have someone help you when you read instructions, pamphlets, or other written materials from your doctor or pharmacy?: 1 - Never What is the last grade level you completed in school?: college  Diabetic?yes Nutrition Risk Assessment:  Has the patient had any N/V/D within the last 2 months?  No  Does the patient have any non-healing wounds?  No  Has the patient had any unintentional weight loss or weight gain?  No   Diabetes:  Is the patient diabetic?  Yes  If diabetic, was a CBG obtained today?  No  Did the patient bring in their glucometer from home?  No  How often do you monitor your CBG's? Never per patient .   Financial Strains and Diabetes Management:  Are you having any financial strains with the device, your supplies or your medication? No .  Does the patient want to be seen by Chronic Care Management for management of their diabetes?  No  Would the patient like to be referred to a Nutritionist or for Diabetic Management?  No   Diabetic Exams:  Diabetic Eye Exam: Completed 10/2021 Diabetic Foot Exam: Overdue, Pt has been advised about the importance in completing this exam. Pt is scheduled for diabetic foot exam on next  office visit .   Interpreter Needed?: No  Information entered by :: L.Wilson,LPN   Activities of Daily Living    01/06/2022    1:09 PM 07/21/2021    4:00 PM  In your present state of health, do you have any difficulty performing the following activities:  Hearing? 1 0  Vision? 0 0  Difficulty concentrating or making decisions? 0 0  Walking or climbing stairs? 0 0  Dressing or bathing? 0 0  Doing errands, shopping? 0 0  Preparing Food and eating ? N   Using the Toilet? N   In the past six months, have you accidently leaked urine? N   Do you have problems with loss of bowel control? N   Managing your Medications? Y   Managing your Finances? N     Patient Care Team: [name], MD as PCP - General (Internal Medicine) Jessy Oto, MD as Consulting Physician (Orthopedic Surgery) Kathie Rhodes, MD (Inactive) as Consulting Physician (Urology) [facility] [place] as Referring Physician ([place]) Irene Shipper, MD as Consulting Physician (Gastroenterology)  Indicate any recent Medical Services you may have received from other than [facility] providers in the past year (date may be approximate).     Assessment:  This is a routine wellness examination for [place].  Hearing/Vision screen Vision Screening - Comments:: Annual eye exams at the [place] wears glasses   Dietary issues and exercise activities discussed: Current Exercise Habits: The patient does not participate in regular exercise at present, Exercise limited by: orthopedic condition(s)   Goals Addressed   None    Depression Screen    01/06/2022    1:12 PM 01/06/2022    1:10 PM 09/12/2021   11:30 AM 07/20/2021   10:42 AM 07/11/2021    9:45 AM 12/16/2020   10:49 AM 11/22/2020   10:30 AM  PHQ 2/9 Scores  PHQ - 2 Score 0 0 0 0 0 0 1    Fall Risk    01/06/2022    1:05 PM 01/03/2022    9:45 AM 11/02/2021   10:45 AM 09/12/2021   11:30 AM 07/20/2021   10:42 AM  Fall Risk   Falls in the past  year? 0 0 0 0 0  Comment  denies new/ recent falls x 12 months; uses cane as indicated continues to deny falls x 12 months; uses cane as/ if indicated continues to deny falls x 12 months   Number falls in past yr: 0 0 0 0 0  Injury with Fall? 0 0 0 0 0  Comment  N/A- no falls reported N/A- no falls reported x 12 months N/A- no falls reported   Risk for fall due to : No Fall Risks Medication side effect;Impaired mobility;Orthopedic patient Medication side effect;History of fall(s);Orthopedic patient;Impaired mobility Medication side effect   Follow up Falls prevention discussed Falls prevention discussed Falls prevention discussed Falls prevention discussed     FALL RISK PREVENTION PERTAINING TO THE HOME:  Any stairs in or around the home? No  If so, are there any without handrails? No  Home free of loose throw rugs in walkways, pet beds, electrical cords, etc? Yes  Adequate lighting in your home to reduce risk of falls? Yes   ASSISTIVE DEVICES UTILIZED TO PREVENT FALLS:  Life alert? Yes  Use of a cane, walker or w/c? Yes  Grab bars in the bathroom? No  Shower chair or bench in shower? No  Elevated toilet seat or a handicapped toilet? No   Cognitive Function:    08/02/2017    3:19 PM 07/07/2015   12:08 PM  MMSE - Mini Mental State Exam  Orientation to time 5 5  Orientation to Place 5 5  Registration 3 3  Attention/ Calculation 4 1  Recall 2 2  Language- name 2 objects 2 2  Language- repeat 1 1  Language- follow 3 step command 3 3  Language- read & follow direction 1 1  Write a sentence 1 1  Copy design 1 1  Total score 28 25      12/26/2018   10:00 AM 05/29/2018   10:00 AM 10/15/2017   11:00 AM  Montreal Cognitive Assessment   Visuospatial/ Executive (0/5)  3 5  Naming (0/3)  3 3  Attention: Read list of digits (0/2) '1 2 2  '$ Attention: Read list of letters (0/1) '1 1 1  '$ Attention: Serial 7 subtraction starting at 100 (0/3) '1 2 3  '$ Language: Repeat phrase (0/2) 0 1 2   Language : Fluency (0/1) 0 0 0  Abstraction (0/2) 0 1 2  Delayed Recall (0/5) 0 0 2  Orientation (0/6) '5 6 6  '$ Total  19 26      01/06/2022    1:12 PM  6CIT Screen  What Year? 0 points  What month? 0 points  What time? 0 points  Count back from 20 0 points  Months in reverse 0 points  Repeat phrase 0 points  Total Score 0 points    Immunizations Immunization History  Administered Date(s) Administered   Fluad Quad(high Dose 65+) 04/02/2019, 05/13/2020, 06/02/2021   Influenza Split 03/04/2014   Influenza Whole 02/13/2012   Influenza, High Dose Seasonal PF 02/03/2013, 03/18/2015, 03/20/2016, 02/09/2017, 02/12/2018, 03/04/2018   Influenza-Unspecified 03/16/2007, 04/19/2011, 02/23/2012, 02/13/2015, 03/15/2016, 05/13/2020   PFIZER(Purple Top)SARS-COV-2 Vaccination 11/15/2019, 12/06/2019   Pneumococcal Conjugate-13 12/10/2013, 03/04/2014   Pneumococcal Polysaccharide-23 01/14/2010, 07/07/2015, 01/01/2018   Pneumococcal-Unspecified 01/12/2004   Rotavirus Monovalent 01/01/2018   Td 01/29/2008   Tdap 07/18/2011   Zoster, Live 01/14/2010    TDAP status: Due, Education has been provided regarding the importance of this vaccine. Advised may receive this vaccine at local pharmacy or Health Dept. Aware to provide a copy of the vaccination record if obtained from local pharmacy or Health Dept. Verbalized acceptance and understanding.  Flu Vaccine status: Up to date  Pneumococcal vaccine status: Up to date  Covid-19 vaccine status: Completed vaccines  Qualifies for Shingles Vaccine? Yes   Zostavax completed No   Shingrix Completed?: No.    Education has been provided regarding the importance of this vaccine. Patient has been advised to call insurance company to determine out of pocket expense if they have not yet received this vaccine. Advised may also receive vaccine at local pharmacy or Health Dept. Verbalized acceptance and understanding.  Screening Tests Health Maintenance   Topic Date Due   Zoster Vaccines- Shingrix (1 of 2) Never done   COVID-19 Vaccine (3 - Pfizer risk series) 01/03/2020   OPHTHALMOLOGY EXAM  11/03/2020   FOOT EXAM  12/17/2020   COLONOSCOPY (Pts 45-20yr Insurance coverage will need to be confirmed)  03/14/2021   Diabetic kidney evaluation - Urine ACR  06/02/2021   TETANUS/TDAP  07/17/2021   HEMOGLOBIN A1C  11/20/2021   INFLUENZA VACCINE  08/13/2022 (Originally 12/13/2021)   Diabetic kidney evaluation - GFR measurement  11/19/2022   Pneumonia Vaccine 76 Years old  Completed   Hepatitis C Screening  Completed   HPV VACCINES  Aged Out    Health Maintenance  Health Maintenance Due  Topic Date Due   Zoster Vaccines- Shingrix (1 of 2) Never done   COVID-19 Vaccine (3 - Pfizer risk series) 01/03/2020   OPHTHALMOLOGY EXAM  11/03/2020   FOOT EXAM  12/17/2020   COLONOSCOPY (Pts 45-431yrInsurance coverage will need to be confirmed)  03/14/2021   Diabetic kidney evaluation - Urine ACR  06/02/2021   TETANUS/TDAP  07/17/2021   HEMOGLOBIN A1C  11/20/2021    Colorectal cancer screening: No longer required.   Lung Cancer Screening: (Low Dose CT Chest recommended if Age 76-80ears, 30 pack-year currently smoking OR have quit w/in 15years.) does not qualify.   Lung Cancer Screening Referral: n/a  Additional Screening:  Hepatitis C Screening: does not qualify; Completed 07/07/2015  Vision Screening: Recommended annual ophthalmology exams for early detection of glaucoma and other disorders of the eye. Is the patient up to date with their annual eye exam?  Yes  Who is the provider or what is the name of the office in which the patient [facility]  If pt is not established with a provider, would they like to be referred to a provider to establish care? No .   Dental Screening: Recommended  annual dental exams for proper oral hygiene  Community Resource Referral / Chronic Care Management: CRR required this visit?  No    CCM required this visit?  No      Plan:     I have personally reviewed and noted the following in the patient's chart:   Medical and social history Use of alcohol, tobacco or illicit drugs  Current medications and supplements including opioid prescriptions. Patient is not currently taking opioid prescriptions. Functional ability and status Nutritional status Physical activity Advanced directives List of other physicians Hospitalizations, surgeries, and ER visits in previous 12 months Vitals Screenings to include cognitive, depression, and falls Referrals and appointments  In addition, I have reviewed and discussed with patient certain preventive protocols, quality metrics, and best practice recommendations. A written personalized care plan for preventive services as well as general preventive health recommendations were provided to patient.     Daphane Shepherd, LPN   0/13/1438   Nurse Notes: Obtain Vaccine report from [place]

## 2022-01-07 ENCOUNTER — Emergency Department (HOSPITAL_COMMUNITY): Payer: Medicare Other

## 2022-01-07 ENCOUNTER — Encounter (HOSPITAL_COMMUNITY): Payer: Self-pay

## 2022-01-07 ENCOUNTER — Other Ambulatory Visit: Payer: Self-pay

## 2022-01-07 ENCOUNTER — Observation Stay (HOSPITAL_COMMUNITY)
Admission: EM | Admit: 2022-01-07 | Discharge: 2022-01-08 | Disposition: A | Discharge status: Home / Self Care | Payer: Medicare Other | Attending: Internal Medicine | Admitting: Internal Medicine

## 2022-01-07 ENCOUNTER — Observation Stay (HOSPITAL_BASED_OUTPATIENT_CLINIC_OR_DEPARTMENT_OTHER): Payer: Medicare Other

## 2022-01-07 DIAGNOSIS — Z85028 Personal history of other malignant neoplasm of stomach: Secondary | ICD-10-CM | POA: Diagnosis not present

## 2022-01-07 DIAGNOSIS — D649 Anemia, unspecified: Secondary | ICD-10-CM

## 2022-01-07 DIAGNOSIS — I132 Hypertensive heart and chronic kidney disease with heart failure and with stage 5 chronic kidney disease, or end stage renal disease: Secondary | ICD-10-CM | POA: Insufficient documentation

## 2022-01-07 DIAGNOSIS — E1122 Type 2 diabetes mellitus with diabetic chronic kidney disease: Secondary | ICD-10-CM | POA: Insufficient documentation

## 2022-01-07 DIAGNOSIS — D631 Anemia in chronic kidney disease: Secondary | ICD-10-CM | POA: Diagnosis not present

## 2022-01-07 DIAGNOSIS — R0602 Shortness of breath: Secondary | ICD-10-CM

## 2022-01-07 DIAGNOSIS — N185 Chronic kidney disease, stage 5: Secondary | ICD-10-CM | POA: Diagnosis not present

## 2022-01-07 DIAGNOSIS — R778 Other specified abnormalities of plasma proteins: Secondary | ICD-10-CM | POA: Diagnosis not present

## 2022-01-07 DIAGNOSIS — K219 Gastro-esophageal reflux disease without esophagitis: Secondary | ICD-10-CM | POA: Diagnosis present

## 2022-01-07 DIAGNOSIS — Z8546 Personal history of malignant neoplasm of prostate: Secondary | ICD-10-CM | POA: Diagnosis not present

## 2022-01-07 DIAGNOSIS — I5032 Chronic diastolic (congestive) heart failure: Secondary | ICD-10-CM | POA: Diagnosis present

## 2022-01-07 DIAGNOSIS — N400 Enlarged prostate without lower urinary tract symptoms: Secondary | ICD-10-CM | POA: Diagnosis present

## 2022-01-07 DIAGNOSIS — J811 Chronic pulmonary edema: Secondary | ICD-10-CM | POA: Diagnosis not present

## 2022-01-07 DIAGNOSIS — C61 Malignant neoplasm of prostate: Secondary | ICD-10-CM | POA: Diagnosis present

## 2022-01-07 DIAGNOSIS — Z85528 Personal history of other malignant neoplasm of kidney: Secondary | ICD-10-CM | POA: Diagnosis not present

## 2022-01-07 DIAGNOSIS — M5030 Other cervical disc degeneration, unspecified cervical region: Secondary | ICD-10-CM | POA: Diagnosis present

## 2022-01-07 DIAGNOSIS — Z79899 Other long term (current) drug therapy: Secondary | ICD-10-CM | POA: Insufficient documentation

## 2022-01-07 LAB — CBC
HCT: 19.4 % — ABNORMAL LOW (ref 39.0–52.0)
Hemoglobin: 6.6 g/dL — CL (ref 13.0–17.0)
MCH: 31.1 pg (ref 26.0–34.0)
MCHC: 34 g/dL (ref 30.0–36.0)
MCV: 91.5 fL (ref 80.0–100.0)
Platelets: 164 10*3/uL (ref 150–400)
RBC: 2.12 MIL/uL — ABNORMAL LOW (ref 4.22–5.81)
RDW: 16.5 % — ABNORMAL HIGH (ref 11.5–15.5)
WBC: 3.9 10*3/uL — ABNORMAL LOW (ref 4.0–10.5)
nRBC: 0 % (ref 0.0–0.2)

## 2022-01-07 LAB — BASIC METABOLIC PANEL
Anion gap: 14 (ref 5–15)
BUN: 71 mg/dL — ABNORMAL HIGH (ref 8–23)
CO2: 14 mmol/L — ABNORMAL LOW (ref 22–32)
Calcium: 9.8 mg/dL (ref 8.9–10.3)
Chloride: 112 mmol/L — ABNORMAL HIGH (ref 98–111)
Creatinine, Ser: 7.14 mg/dL — ABNORMAL HIGH (ref 0.61–1.24)
GFR, Estimated: 7 mL/min — ABNORMAL LOW (ref >60)
Glucose, Bld: 142 mg/dL — ABNORMAL HIGH (ref 70–99)
Potassium: 3.9 mmol/L (ref 3.5–5.1)
Sodium: 140 mmol/L (ref 135–145)

## 2022-01-07 LAB — ECHOCARDIOGRAM COMPLETE
AR max vel: 1.79 cm2
AV Area VTI: 1.81 cm2
AV Area mean vel: 1.68 cm2
AV Mean grad: 8 mmHg
AV Peak grad: 13.8 mmHg
Ao pk vel: 1.86 m/s
Area-P 1/2: 3.65 cm2
S' Lateral: 3.2 cm

## 2022-01-07 LAB — TROPONIN I (HIGH SENSITIVITY)
Troponin I (High Sensitivity): 368 ng/L (ref <18)
Troponin I (High Sensitivity): 356 ng/L (ref <18)

## 2022-01-07 LAB — BRAIN NATRIURETIC PEPTIDE: B Natriuretic Peptide: 207.6 pg/mL — ABNORMAL HIGH (ref 0.0–100.0)

## 2022-01-07 LAB — PREPARE RBC (CROSSMATCH)

## 2022-01-07 MED ORDER — HEPARIN SODIUM (PORCINE) 5000 UNIT/ML IJ SOLN
5000.0000 [IU] | Freq: Three times a day (TID) | INTRAMUSCULAR | Status: DC
  Administered 2022-01-07 (×2): 5000 [IU] via SUBCUTANEOUS
  Filled 2022-01-07 (×2): qty 1

## 2022-01-07 MED ORDER — ATORVASTATIN CALCIUM 10 MG PO TABS
20.0000 mg | ORAL_TABLET | Freq: Every day | ORAL | Status: DC
  Administered 2022-01-07: 20 mg via ORAL
  Filled 2022-01-07: qty 2

## 2022-01-07 MED ORDER — METOPROLOL TARTRATE 25 MG PO TABS: 25.0000 mg | ORAL_TABLET | Freq: Two times a day (BID) | ORAL | Status: DC

## 2022-01-07 MED ORDER — METOPROLOL TARTRATE 25 MG PO TABS
25.0000 mg | ORAL_TABLET | Freq: Two times a day (BID) | ORAL | Status: DC
  Administered 2022-01-07 – 2022-01-08 (×2): 25 mg via ORAL
  Filled 2022-01-07 (×2): qty 1

## 2022-01-07 MED ORDER — PANTOPRAZOLE SODIUM 20 MG PO TBEC
20.0000 mg | DELAYED_RELEASE_TABLET | Freq: Every day | ORAL | Status: DC
  Administered 2022-01-07: 20 mg via ORAL
  Filled 2022-01-07 (×2): qty 1

## 2022-01-07 MED ORDER — SODIUM BICARBONATE 650 MG PO TABS
1300.0000 mg | ORAL_TABLET | Freq: Two times a day (BID) | ORAL | Status: DC
  Administered 2022-01-07 – 2022-01-08 (×3): 1300 mg via ORAL
  Filled 2022-01-07 (×4): qty 2

## 2022-01-07 MED ORDER — CALCIUM ACETATE (PHOS BINDER) 667 MG PO CAPS
667.0000 mg | ORAL_CAPSULE | Freq: Three times a day (TID) | ORAL | Status: DC
Start: 2022-01-07 — End: 2022-01-08
  Administered 2022-01-07 – 2022-01-08 (×2): 667 mg via ORAL
  Filled 2022-01-07 (×2): qty 1

## 2022-01-07 MED ORDER — CALCITRIOL 0.25 MCG PO CAPS
0.2500 ug | ORAL_CAPSULE | Freq: Every day | ORAL | Status: DC
  Administered 2022-01-08: 0.25 ug via ORAL
  Filled 2022-01-07: qty 1

## 2022-01-07 MED ORDER — SODIUM CHLORIDE 0.9 % IV SOLN: 10.0000 mL/h | Freq: Once | INTRAVENOUS | Status: DC

## 2022-01-07 MED ORDER — SODIUM ZIRCONIUM CYCLOSILICATE 10 G PO PACK
10.0000 g | PACK | Freq: Every day | ORAL | Status: DC
  Administered 2022-01-08: 10 g via ORAL
  Filled 2022-01-07: qty 1

## 2022-01-07 MED ORDER — APOAEQUORIN 20 MG PO CAPS: ORAL_CAPSULE | Freq: Every day | ORAL | Status: DC

## 2022-01-07 MED ORDER — GEMFIBROZIL 600 MG PO TABS
600.0000 mg | ORAL_TABLET | Freq: Two times a day (BID) | ORAL | Status: DC
  Administered 2022-01-08: 600 mg via ORAL
  Filled 2022-01-07 (×2): qty 1

## 2022-01-07 MED ORDER — GERITOL TONIC PO LIQD: 10.0000 mL | Freq: Every day | ORAL | Status: DC

## 2022-01-07 MED ORDER — ELDERTONIC PO LIQD
15.0000 mL | Freq: Every day | ORAL | Status: DC
  Administered 2022-01-08: 15 mL via ORAL
  Filled 2022-01-07: qty 15

## 2022-01-07 MED ORDER — FESOTERODINE FUMARATE ER 4 MG PO TB24
4.0000 mg | ORAL_TABLET | Freq: Every day | ORAL | Status: DC
  Administered 2022-01-08: 4 mg via ORAL
  Filled 2022-01-07: qty 1

## 2022-01-07 MED ORDER — FUROSEMIDE 40 MG PO TABS
40.0000 mg | ORAL_TABLET | Freq: Every day | ORAL | Status: DC
  Administered 2022-01-08: 40 mg via ORAL
  Filled 2022-01-07: qty 1

## 2022-01-07 MED ORDER — TAMSULOSIN HCL 0.4 MG PO CAPS
0.4000 mg | ORAL_CAPSULE | Freq: Every day | ORAL | Status: DC
  Administered 2022-01-08: 0.4 mg via ORAL
  Filled 2022-01-07: qty 1

## 2022-01-07 MED ORDER — DARBEPOETIN ALFA 40 MCG/0.4ML IJ SOSY
40.0000 ug | PREFILLED_SYRINGE | Freq: Once | INTRAMUSCULAR | Status: AC
  Administered 2022-01-07: 40 ug via SUBCUTANEOUS
  Filled 2022-01-07: qty 0.4

## 2022-01-07 MED ORDER — FERROUS SULFATE 325 (65 FE) MG PO TABS
325.0000 mg | ORAL_TABLET | Freq: Two times a day (BID) | ORAL | Status: DC
  Administered 2022-01-07 – 2022-01-08 (×2): 325 mg via ORAL
  Filled 2022-01-07 (×2): qty 1

## 2022-01-07 MED ORDER — POLYVINYL ALCOHOL 1.4 % OP SOLN
1.0000 [drp] | Freq: Every day | OPHTHALMIC | Status: DC | PRN
  Filled 2022-01-07: qty 15

## 2022-01-07 MED ORDER — GABAPENTIN 300 MG PO CAPS
300.0000 mg | ORAL_CAPSULE | Freq: Three times a day (TID) | ORAL | Status: DC
  Administered 2022-01-07 – 2022-01-08 (×3): 300 mg via ORAL
  Filled 2022-01-07 (×3): qty 1

## 2022-01-07 NOTE — ED Triage Notes (Signed)
Patient complains of exertional SOB x 2 weeks. Denies cough, no fever, no cp. No swelling in extremities. Alert and oriented, NAD

## 2022-01-07 NOTE — ED Notes (Signed)
Pt alert, NAD, calm, interactive, resps e/u, speaking in clear complete sentences. VSS. Pt lying flat for echo in progress. Wife at [facility].

## 2022-01-07 NOTE — Progress Notes (Signed)
Patient refused bed alarm. I informed patient he is considered risk for falls. Fall education done and he verbalizes understanding

## 2022-01-07 NOTE — H&P (Signed)
History and Physical    Antonio Adams ENI:[id number] DOB: 26-Mar-1946 DOA: 01/07/2022  PCP: [name], MD (Confirm with patient/family/NH records and if not entered, this has to be entered at [facility] point of entry) Patient coming from: Home  I have personally briefly reviewed patient's old medical records in [place]  Chief Complaint: SOB  HPI: Antonio Adams is a 76 y.o. male with medical history significant of CKD stage V, chronic iron deficiency anemia and chronic anemia secondary to CKD, IIDM with diet control, BPH, diabetic neuropathy, prostate and renal cell carcinoma status post radiation therapy, lower GI bleed secondary to radiation proctitis status post APC 2018, duodenum carcinoid, metastatic renal cancer off chemo/immunotherapy, on surveillance, presented with increasing shortness of breath.  Patient has a chronic anemia, appears to have current iron deficiency and CKD etiology, for which he has been taking iron supplement and getting PRBC at least 1 time a month since earlier this year.  Last transfusion was in July.  For the last 2+ weeks, patient developed increasing exertional dyspnea, no cough no chest pain no leg swelling.  He has a chronic right lower quadrant abdominal pain, for which he has been following with GI as outpatient and has been stable and the pain was considered related to surgical scar/superficial nerve damage.  He told me that neither the location or the nature of the pain has changed, the pain is worsening with certain body movement but not related to meals.  Denies any black tarry stool or blood clot/blood in the stool.  Has been feeling decreased appetite recently and his oncologist started him on Megace.  ED Course: No tachycardia no hypotension saturation 100% on room air.  Chest x-ray clear for infiltrates EKG is nonischemic.  Hb=6.6.  Troponin is 368> 356.  EKG LVH, multiple ST changes, T wave flattening V4-V6  Review of Systems: As per  HPI otherwise 14 point review of systems negative.    Past Medical History:  Diagnosis Date   ADENOCARCINOMA, PROSTATE, GLEASON GRADE 6 12/21/2009   prostate cancer   ALLERGIC RHINITIS 03/16/2007   Allergy    Anxiety    Arthritis    back    BACK PAIN WITH RADICULOPATHY 11/23/2008   Cancer of kidney ([place])    partial right kidney removed   CARPAL TUNNEL SYNDROME, BILATERAL 03/16/2007   Chronic back pain    Constipation    takes Senokot daily   Depression    occasionally   DIABETES MELLITUS, TYPE II 03/16/2007   takes Januvia and MEtformin daily   GASTROINTESTINAL HEMORRHAGE, HX OF 03/16/2007   GERD 03/16/2007   takes Omeprazole daily   Glaucoma    mild - no eye drops   Hemorrhoids    History of colon polyps    HYPERLIPIDEMIA 03/16/2007   takes Crestor daily   HYPERTENSION 03/16/2007   takes Diltiazem and Lisinopril daily    Kidney cancer, primary, with metastasis from kidney to other site [facility])    LEG CRAMPS 05/22/2007   Nocturia    Overactive bladder    PARESTHESIA 05/21/2007   Proctitis    PTSD (post-traumatic stress disorder)    wakes up may be dreaming of fighting   Rectal bleeding    Dr.Norins has explained its from the Radiation that he has received   Rectal bleeding    Renal cell carcinoma ([place])    Sleep apnea    uses CPAP nightly   Stomach cancer ([place])    Urinary frequency  takes Toviaz daily    Past Surgical History:  Procedure Laterality Date   ADRENALECTOMY Right 02/26/2020   Procedure: OPEN RIGHT RENAL CELL METASTATECTOMY AND  ADRENALECTOMY;  Surgeon: [name], MD;  Location: [facility];  Service: Urology;  Laterality: Right;   [place], 2008   repeat surgery; ESI '08   [place]     bilateral   CERVICAL FUSION     COLONOSCOPY     colonosocpy     ESOPHAGOGASTRODUODENOSCOPY     ED   FLEXIBLE SIGMOIDOSCOPY N/A 01/08/2017   Procedure: FLEXIBLE SIGMOIDOSCOPY;  Surgeon: Irene Shipper, MD;  Location: [place];  Service:  Endoscopy;  Laterality: N/A;   HOT HEMOSTASIS N/A 01/08/2017   Procedure: HOT HEMOSTASIS (ARGON PLASMA COAGULATION/BICAP);  Surgeon: Irene Shipper, MD;  Location: [facility];  Service: Endoscopy;  Laterality: N/A;   IR RADIOLOGIST EVAL & MGMT  12/17/2019   IR RADIOLOGIST EVAL & MGMT  07/01/2020   IR RADIOLOGIST EVAL & MGMT  08/25/2020   LUMBAR LAMINECTOMY  10/30/2011   Procedure: MICRODISCECTOMY LUMBAR LAMINECTOMY;  Surgeon: Jessy Oto, MD;  Location: [place];  Service: Orthopedics;  Laterality: Right;  Right L4-5 and L5-S1 Microdiscectomy   LUMBAR LAMINECTOMY  06/17/2012   Procedure: MICRODISCECTOMY LUMBAR LAMINECTOMY;  Surgeon: Jessy Oto, MD;  Location: [place];  Service: Orthopedics;  Laterality: N/A;  Right L5-S1 microdiscectomy   OPEN PARTIAL HEPATECTOMY  N/A 02/26/2020   Procedure: OPEN PARTIAL HEPATECTOMY, CHOLECYSTECTOMY;  Surgeon: Stark Klein, MD;  Location: [facility];  Service: General;  Laterality: N/A;   POLYPECTOMY     PROSTATE SURGERY  march 2012   seed implant   RADIOFREQUENCY ABLATION N/A 07/21/2020   Procedure: CT MICROWAVE ABLATION;  Surgeon: Criselda Peaches, MD;  Location: [facility];  Service: Anesthesiology;  Laterality: N/A;   ROBOTIC ASSITED PARTIAL NEPHRECTOMY Right 09/08/2015   Procedure: XI ROBOTIC ASSITED RIGHT PARTIAL NEPHRECTOMY;  Surgeon: [name], MD;  [facility];  Service: Urology;  Laterality: Right;  Clamp on: 1033 Clamp off: 1103 Total Clamp Time: 30 minutes   SHOULDER ARTHROSCOPY WITH SUBACROMIAL DECOMPRESSION Left 01/20/2016   Procedure: LEFT SHOULDER ARTHROSCOPY WITH EXTENSIVE  DEBRIDEMENT, ACROMIOPLASTY;  Surgeon: Ninetta Lights, MD;  Location: [place];  Service: Orthopedics;  Laterality: Left;   SHOULDER SURGERY  LFT   Stress Cardiolite  12/24/2003   negative for ischemia   UPPER ESOPHAGEAL ENDOSCOPIC ULTRASOUND (EUS)  04/18/2016   [facility]   UPPER GASTROINTESTINAL ENDOSCOPY     WOUND EXPLORATION     management of a  wound that he sustained in the [place]     reports that he has never smoked. He has never used smokeless tobacco. He reports that he does not currently use alcohol. He reports that he does not use drugs.  Allergies  Allergen Reactions   Ace Inhibitors Swelling    lisinopril   Xyzal [Levocetirizine] Other (See Comments)    Break out   Opdivo [Nivolumab] Rash    Family History  Problem Relation Age of Onset   Cancer Sister 55       Cancer, unsure type   Hypertension Other    Colon cancer Neg Hx    Esophageal cancer Neg Hx    Stomach cancer Neg Hx    Diabetes Neg Hx    Colon polyps Neg Hx    Rectal cancer Neg Hx      Prior to Admission medications  Medication Sig Start Date End Date Taking? Authorizing Provider  Apoaequorin (PREVAGEN EXTRA STRENGTH PO) Take 1 tablet by mouth daily.   Yes [provider]  atorvastatin (LIPITOR) 20 MG tablet Take 1 tablet (20 mg total) by mouth at bedtime. 05/29/21 01/07/22 Yes Kayleen Memos, DO  calcitRIOL (ROCALTROL) 0.25 MCG capsule Take 0.25 mcg by mouth daily. 01/05/22  Yes [provider]  Cyanocobalamin (B-12 PO) Take 1 tablet by mouth daily.   Yes [provider]  ferrous sulfate 325 (65 FE) MG tablet Take 1 tablet (325 mg total) by mouth 2 (two) times daily. Patient needs office visit for further refills Patient taking differently: Take 325 mg by mouth 2 (two) times daily. 02/24/19  Yes Irene Shipper, MD  fluocinonide-emollient (LIDEX-E) 0.05 % cream Apply 1 Application topically daily. 01/05/22  Yes [provider]  furosemide (LASIX) 40 MG tablet TAKE 1 TABLET BY MOUTH EVERY DAY Patient taking differently: Take 40 mg by mouth daily. 10/28/21  Yes [name], MD  gabapentin (NEURONTIN) 300 MG capsule Take 300 mg by mouth 3 (three) times daily. 01/03/22  Yes [provider]  Iron-Vitamins (GERITOL) LIQD Take 10 mLs by mouth daily.   Yes [provider]  LOKELMA 10 g PACK packet Take  10 g by mouth daily. 05/04/21  Yes [provider]  megestrol (MEGACE) 40 MG/ML suspension TAKE 10 MLS (400 MG TOTAL) BY MOUTH DAILY. Patient taking differently: Take 400 mg by mouth daily. 12/28/21  Yes Wyatt Portela, MD  metoprolol tartrate (LOPRESSOR) 25 MG tablet Take 1 tablet (25 mg total) by mouth 2 (two) times daily. 08/26/21 01/07/22 Yes Loel Dubonnet, NP  Omega-3 Fatty Acids (FISH OIL PO) Take 1 tablet by mouth daily.   Yes [provider]  OVER THE COUNTER MEDICATION Take 1 tablet by mouth daily. Focus factor   Yes [provider]  polyvinyl alcohol (LIQUIFILM TEARS) 1.4 % ophthalmic solution Place 1 drop into both eyes daily as needed for dry eyes.   Yes [provider]  solifenacin (VESICARE) 5 MG tablet TAKE 1 TABLET (5 MG TOTAL) BY MOUTH DAILY. 12/05/21  Yes [name], MD  tamsulosin (FLOMAX) 0.4 MG CAPS capsule TAKE 1 CAPSULE (0.4 MG TOTAL) BY MOUTH DAILY. Patient taking differently: Take 0.4 mg by mouth daily. 01/20/14  Yes [name], MD  zinc gluconate 50 MG tablet Take 1 tablet (50 mg total) by mouth daily. 12/20/21  Yes [name], MD    Physical Exam: Vitals:   01/07/22 1405 01/07/22 1415 01/07/22 1430 01/07/22 1445  BP:  126/66 116/82 118/75  Pulse: 64 68 68 65  Resp: 15 (!) '22 17 19  '$ Temp:      SpO2: 100% 100% 100% 100%    Constitutional: NAD, calm, comfortable Vitals:   01/07/22 1405 01/07/22 1415 01/07/22 1430 01/07/22 1445  BP:  126/66 116/82 118/75  Pulse: 64 68 68 65  Resp: 15 (!) '22 17 19  '$ Temp:      SpO2: 100% 100% 100% 100%   Eyes: PERRL, lids and conjunctivae normal ENMT: Mucous membranes are moist. Posterior pharynx clear of any exudate or lesions.Normal dentition.  Neck: normal, supple, no masses, no thyromegaly Respiratory: clear to auscultation bilaterally, no wheezing, no crackles. Normal respiratory effort. No accessory muscle use.  Cardiovascular: Regular rate and rhythm, no murmurs / rubs /  gallops. No extremity edema. 2+ pedal pulses. No carotid bruits.  Abdomen: no tenderness, no masses  palpated. No hepatosplenomegaly. Bowel sounds positive.  Musculoskeletal: no clubbing / cyanosis. No joint deformity upper and lower extremities. Good ROM, no contractures. Normal muscle tone.  Skin: no rashes, lesions, ulcers. No induration Neurologic: CN 2-12 grossly intact. Sensation intact, DTR normal. Strength 5/5 in all 4.  Psychiatric: Normal judgment and insight. Alert and oriented x 3. Normal mood.     Labs on Admission: I have personally reviewed following labs and imaging studies  CBC: Recent Labs  Lab 01/07/22 1131  WBC 3.9*  HGB 6.6*  HCT 19.4*  MCV 91.5  PLT 591   Basic Metabolic Panel: Recent Labs  Lab 01/07/22 1131  NA 140  K 3.9  CL 112*  CO2 14*  GLUCOSE 142*  BUN 71*  CREATININE 7.14*  CALCIUM 9.8   GFR: Estimated Creatinine Clearance: 10.3 mL/min (A) (by C-G formula based on SCr of 7.14 mg/dL (H)). Liver Function Tests: No results for input(s): "AST", "ALT", "ALKPHOS", "BILITOT", "PROT", "ALBUMIN" in the last 168 hours. No results for input(s): "LIPASE", "AMYLASE" in the last 168 hours. No results for input(s): "AMMONIA" in the last 168 hours. Coagulation Profile: No results for input(s): "INR", "PROTIME" in the last 168 hours. Cardiac Enzymes: No results for input(s): "CKTOTAL", "CKMB", "CKMBINDEX", "TROPONINI" in the last 168 hours. BNP (last 3 results) Recent Labs    07/20/21 1119  PROBNP 119.0*   HbA1C: No results for input(s): "HGBA1C" in the last 72 hours. CBG: No results for input(s): "GLUCAP" in the last 168 hours. Lipid Profile: No results for input(s): "CHOL", "HDL", "LDLCALC", "TRIG", "CHOLHDL", "LDLDIRECT" in the last 72 hours. Thyroid Function Tests: No results for input(s): "TSH", "T4TOTAL", "FREET4", "T3FREE", "THYROIDAB" in the last 72 hours. Anemia Panel: No results for input(s): "VITAMINB12", "FOLATE", "FERRITIN", "TIBC",  "IRON", "RETICCTPCT" in the last 72 hours. Urine analysis:    Component Value Date/Time   COLORURINE STRAW (A) 05/23/2021 0902   APPEARANCEUR CLEAR 05/23/2021 0902   LABSPEC 1.012 05/23/2021 0902   PHURINE 5.0 05/23/2021 0902   GLUCOSEU NEGATIVE 05/23/2021 0902   GLUCOSEU 250 (A) 05/20/2020 1606   HGBUR MODERATE (A) 05/23/2021 0902   BILIRUBINUR NEGATIVE 05/23/2021 0902   BILIRUBINUR neg 03/21/2012 1127   KETONESUR NEGATIVE 05/23/2021 0902   PROTEINUR 100 (A) 05/23/2021 0902   UROBILINOGEN 0.2 05/20/2020 1606   NITRITE NEGATIVE 05/23/2021 0902   LEUKOCYTESUR NEGATIVE 05/23/2021 0902    Radiological Exams on Admission: DG Chest 2 View  Result Date: 01/07/2022 CLINICAL DATA:  Shortness of breath. EXAM: CHEST - 2 VIEW COMPARISON:  Two-view chest x-ray 07/20/2021 FINDINGS: Heart size is exaggerated by low lung volumes. Mild pulmonary vascular congestion is present. Bibasilar airspace opacities likely reflect atelectasis without definite consolidation. The upper lung fields are clear. IMPRESSION: 1. Low lung volumes and mild pulmonary vascular congestion. 2. Bibasilar airspace disease likely reflects atelectasis. Electronically Signed   By: San Morelle M.D.   On: 01/07/2022 11:50    EKG: Independently reviewed.  Sinus, LVH, T wave flattening on multiple leads involving 2-3, V4-V6.  Assessment/Plan Principal Problem:   Symptomatic anemia  (please populate well all problems here in Problem List. (For example, if patient is on BP meds at home and you resume or decide to hold them, it is a problem that needs to be her. Same for CAD, COPD, HLD and so on)  Acute on chronic symptomatic anemia -History of lower GI bleed secondary to radiation proctitis however recent iron study 3 weeks ago showed normal iron study, as well as  the trending of her study remains normal.  He has been following with GI as outpatient and was considered his GI issue has been stable.  And his hemoglobin has been  stable, and according to nephrology and oncology's note, his chronic anemia more likely related to his worsening CKD.  But he never received Epogen before. -PRBC x1 -Epogen x1, and outpatient follow-up with nephrology for outpatient reporting confusion.  Elevated troponins -No chest pains, the pattern of elevation troponin is flat, arguing against ACS.  Likely demand ischemia.  Check third set of troponin tomorrow morning, echocardiogram.  Recommend outpatient follow-up with PCP to discuss about outpatient stress test.  CKD stage V -Following with [place] [facility], patient adamant he does not want HD in any case. -Euvolemic, on Lasix. -Given his recently stomach upset, I recommended PPI for GI prophylaxis for uremic gastritis/ulcer.  BPH -Stable, continue Flomax  HTN -Continue metoprolol  HLD -Continue atorvastatin and gemfibrozil  DVT prophylaxis: Heparin subcu Code Status: Full code Family Communication: Wife at bedside Disposition Plan: Expect less than 2 midnight hospital stay Consults called: None Admission status: Telemetry observation   Lequita Halt MD [facility] Pager [phone]  01/07/2022, 3:42 PM

## 2022-01-07 NOTE — ED Notes (Signed)
Admitting MD at [facility]. Wife at [facility]. Pt resting comfortably. Denies sx while at rest.

## 2022-01-07 NOTE — ED Provider Notes (Signed)
[facility]ERGENCY DEPARTMENT Provider Note   CSN: [id number] Arrival date & time: 01/07/22  1115     History  No chief complaint on file.   Antonio Adams is a 76 y.o. male.  The history is provided by the patient, the spouse and medical records. No language interpreter was used.  Shortness of Breath Severity:  Moderate Onset quality:  Gradual Duration:  1 week Timing:  Intermittent Progression:  Waxing and waning Chronicity:  Recurrent Relieved by:  Rest Worsened by:  Exertion Ineffective treatments:  None tried Associated symptoms: no abdominal pain, no chest pain, no claudication, no cough, no diaphoresis, no fever, no headaches, no neck pain, no rash, no sputum production, no vomiting and no wheezing   Risk factors: hx of cancer   Risk factors: no hx of PE/DVT        Home Medications Prior to Admission medications   Medication Sig Start Date End Date Taking? Authorizing Provider  Apoaequorin (PREVAGEN EXTRA STRENGTH PO) Take 1 tablet by mouth daily.    [provider]  atorvastatin (LIPITOR) 20 MG tablet Take 1 tablet (20 mg total) by mouth at bedtime. 05/29/21 09/23/21  Kayleen Memos, DO  ferrous sulfate 325 (65 FE) MG tablet Take 1 tablet (325 mg total) by mouth 2 (two) times daily. Patient needs office visit for further refills Patient taking differently: Take 325 mg by mouth 2 (two) times daily. 02/24/19   Irene Shipper, MD  furosemide (LASIX) 40 MG tablet TAKE 1 TABLET BY MOUTH EVERY DAY 10/28/21   [name], MD  gabapentin (NEURONTIN) 100 MG capsule Take 100 mg by mouth at bedtime.    [provider]  gemfibrozil (LOPID) 600 MG tablet Take 600 mg by mouth 2 (two) times daily before a meal. Patient not taking: Reported on 01/06/2022    [provider]  Iron-Vitamins (GERITOL) LIQD Take 10 mLs by mouth daily.    [provider]  LOKELMA 10 g PACK packet Take 10 g by mouth daily. 05/04/21   [provider]  megestrol (MEGACE) 40 MG/ML suspension TAKE 10 MLS (400 MG TOTAL) BY MOUTH DAILY. 12/28/21   Wyatt Portela, MD  metoprolol tartrate (LOPRESSOR) 25 MG tablet Take 1 tablet (25 mg total) by mouth 2 (two) times daily. 08/26/21 11/24/21  Loel Dubonnet, NP  Omega-3 Fatty Acids (FISH OIL PO) Take 1 tablet by mouth daily.    [provider]  OVER THE COUNTER MEDICATION Take 1 tablet by mouth daily. Focus factor    [provider]  oxyCODONE (OXY IR/ROXICODONE) 5 MG immediate release tablet Take 1 tablet (5 mg total) by mouth every 4 (four) hours as needed for severe pain. Patient not taking: Reported on 01/06/2022 12/15/21   [name], MD  polyvinyl alcohol (LIQUIFILM TEARS) 1.4 % ophthalmic solution Place 1 drop into both eyes daily as needed for dry eyes.    [provider]  solifenacin (VESICARE) 5 MG tablet TAKE 1 TABLET (5 MG TOTAL) BY MOUTH DAILY. 12/05/21   [name], MD  tamsulosin (FLOMAX) 0.4 MG CAPS capsule TAKE 1 CAPSULE (0.4 MG TOTAL) BY MOUTH DAILY. Patient taking differently: Take 0.4 mg by mouth daily. 01/20/14   [name], MD  zinc gluconate 50 MG tablet Take 1 tablet (50 mg total) by mouth daily. 12/20/21   [name], MD      Allergies    Ace inhibitors, Xyzal [levocetirizine], and  Opdivo [nivolumab]    Review of Systems   Review of Systems  Constitutional:  Positive for fatigue. Negative for chills, diaphoresis and fever.  HENT:  Negative for congestion.   Eyes:  Negative for visual disturbance.  Respiratory:  Positive for chest tightness and shortness of breath. Negative for cough, sputum production and wheezing.   Cardiovascular:  Negative for chest pain, palpitations, claudication and leg swelling.  Gastrointestinal:  Negative for abdominal distention, abdominal pain, anal bleeding, blood in stool, constipation, diarrhea, nausea and vomiting.  Genitourinary:  Negative for dysuria, flank pain and frequency.   Musculoskeletal:  Negative for back pain, neck pain and neck stiffness.  Skin:  Negative for rash and wound.  Neurological:  Negative for light-headedness and headaches.  Psychiatric/Behavioral:  Negative for agitation.   All other systems reviewed and are negative.   Physical Exam Updated Vital Signs BP (!) 109/59 (BP Location: Left Arm)   Pulse 90   Temp 98.6 F (37 C)   SpO2 100%  Physical Exam Vitals and nursing note reviewed.  Constitutional:      General: He is not in acute distress.    Appearance: He is well-developed.  HENT:     Head: Normocephalic and atraumatic.     Nose: No congestion or rhinorrhea.     Mouth/Throat:     Mouth: Mucous membranes are moist.     Pharynx: No oropharyngeal exudate or posterior oropharyngeal erythema.  Eyes:     Extraocular Movements: Extraocular movements intact.     Conjunctiva/sclera: Conjunctivae normal.     Pupils: Pupils are equal, round, and reactive to light.  Cardiovascular:     Rate and Rhythm: Normal rate and regular rhythm.     Heart sounds: No murmur heard. Pulmonary:     Effort: Pulmonary effort is normal. No respiratory distress.     Breath sounds: Normal breath sounds. No wheezing, rhonchi or rales.  Chest:     Chest wall: No tenderness.  Abdominal:     General: Abdomen is flat.     Palpations: Abdomen is soft.     Tenderness: There is no abdominal tenderness. There is no right CVA tenderness, left CVA tenderness, guarding or rebound.  Musculoskeletal:        General: No swelling or tenderness.     Cervical back: Neck supple. No tenderness.     Right lower leg: No edema.     Left lower leg: No edema.  Skin:    General: Skin is warm and dry.     Capillary Refill: Capillary refill takes less than 2 seconds.     Coloration: Skin is pale.     Findings: No erythema or rash.  Neurological:     General: No focal deficit present.     Mental Status: He is alert.     Sensory: No sensory deficit.     Motor: No  weakness.  Psychiatric:        Mood and Affect: Mood normal.     ED Results / Procedures / Treatments   Labs (all labs ordered are listed, but only abnormal results are displayed) Labs Reviewed  BASIC METABOLIC PANEL - Abnormal; Notable for the following components:      Result Value   Chloride 112 (*)    CO2 14 (*)    Glucose, Bld 142 (*)    BUN 71 (*)    Creatinine, Ser 7.14 (*)    GFR, Estimated 7 (*)    All other components within normal  limits  CBC - Abnormal; Notable for the following components:   WBC 3.9 (*)    RBC 2.12 (*)    Hemoglobin 6.6 (*)    HCT 19.4 (*)    RDW 16.5 (*)    All other components within normal limits  BRAIN NATRIURETIC PEPTIDE - Abnormal; Notable for the following components:   B Natriuretic Peptide 207.6 (*)    All other components within normal limits  TROPONIN I (HIGH SENSITIVITY) - Abnormal; Notable for the following components:   Troponin I (High Sensitivity) 368 (*)    All other components within normal limits  TROPONIN I (HIGH SENSITIVITY) - Abnormal; Notable for the following components:   Troponin I (High Sensitivity) 356 (*)    All other components within normal limits  OCCULT BLOOD X 1 CARD TO LAB, STOOL  CBC  BASIC METABOLIC PANEL  TYPE AND SCREEN  PREPARE RBC (CROSSMATCH)  TROPONIN I (HIGH SENSITIVITY)    EKG EKG Interpretation  Date/Time:  Saturday January 07 2022 11:25:50 EDT Ventricular Rate:  89 PR Interval:  166 QRS Duration: 76 QT Interval:  342 QTC Calculation: 416 R Axis:   1 Text Interpretation: Normal sinus rhythm Left ventricular hypertrophy with repolarization abnormality ( R in aVL , Romhilt-Estes ) Abnormal ECG When compared with ECG of 20-Jul-2021 20:53, PREVIOUS ECG IS PRESENT when compared to prior, similar appearance. No STEMI Confirmed by Antony Blackbird [phone]) on 01/07/2022 1:22:46 PM  Radiology ECHOCARDIOGRAM COMPLETE  Result Date: 01/07/2022    ECHOCARDIOGRAM REPORT   Patient Name:   WINNIE BARSKY Date of Exam: 01/07/2022 Medical Rec #:  [id number]           Height:       71.0 in Accession #:    [id number]          Weight:       202.0 lb Date of Birth:  August 08, 1945          BSA:          2.117 m Patient Age:    73 years            BP:           118/75 mmHg Patient Gender: M                   HR:           66 bpm. Exam Location:  Inpatient Procedure: 2D Echo, Cardiac Doppler and Color Doppler Indications:    Elevated troponins  History:        Patient has prior history of Echocardiogram examinations, most                 recent 07/21/2021. CHF, Signs/Symptoms:Shortness of Breath; Risk                 Factors:Sleep Apnea, Hypertension, Diabetes and Dyslipidemia.  Sonographer:    Merrie Roof RDCS Referring Phys: Lequita Halt IMPRESSIONS  1. Left ventricular ejection fraction, by estimation, is 60 to 65%. Left ventricular ejection fraction by PLAX is 65 %. The left ventricle has normal function. The left ventricle has no regional wall motion abnormalities. Left ventricular diastolic parameters are consistent with Grade I diastolic dysfunction (impaired relaxation).  2. Right ventricular systolic function is low normal. The right ventricular size is normal. Tricuspid regurgitation signal is inadequate for assessing PA pressure.  3. The mitral valve is abnormal. Trivial mitral valve regurgitation.  4. The aortic valve is tricuspid. Aortic valve  regurgitation is not visualized. Mild aortic valve stenosis. Aortic valve area, by VTI measures 1.81 cm. Aortic valve mean gradient measures 8.0 mmHg. Aortic valve Vmax measures 1.86 m/s. DI is 0.48, Peak gradient 14 mmHg, based on LVOT diameter of 2.2 cm.  5. The inferior vena cava is normal in size with greater than 50% respiratory variability, suggesting right atrial pressure of 3 mmHg. Comparison(s): Changes from prior study are noted. 07/21/2021: LVEF 48%, global hypokinesis. FINDINGS  Left Ventricle: Left ventricular ejection fraction, by estimation, is 60 to 65%.  Left ventricular ejection fraction by PLAX is 65 %. The left ventricle has normal function. The left ventricle has no regional wall motion abnormalities. The left ventricular internal cavity size was normal in size. There is no left ventricular hypertrophy. Left ventricular diastolic parameters are consistent with Grade I diastolic dysfunction (impaired relaxation). Indeterminate filling pressures. Right Ventricle: The right ventricular size is normal. No increase in right ventricular wall thickness. Right ventricular systolic function is low normal. Tricuspid regurgitation signal is inadequate for assessing PA pressure. Left Atrium: Left atrial size was normal in size. Right Atrium: Right atrial size was normal in size. Pericardium: There is no evidence of pericardial effusion. Mitral Valve: The mitral valve is abnormal. There is mild thickening of the anterior mitral valve leaflet(s). Trivial mitral valve regurgitation. Tricuspid Valve: The tricuspid valve is grossly normal. Tricuspid valve regurgitation is trivial. Aortic Valve: The aortic valve is tricuspid. Aortic valve regurgitation is not visualized. Mild aortic stenosis is present. Aortic valve mean gradient measures 8.0 mmHg. Aortic valve peak gradient measures 13.8 mmHg. Aortic valve area, by VTI measures 1.81 cm. Pulmonic Valve: The pulmonic valve was normal in structure. Pulmonic valve regurgitation is not visualized. Aorta: The aortic root and ascending aorta are structurally normal, with no evidence of dilitation. Venous: The inferior vena cava is normal in size with greater than 50% respiratory variability, suggesting right atrial pressure of 3 mmHg. IAS/Shunts: No atrial level shunt detected by color flow Doppler.  LEFT VENTRICLE PLAX 2D LV EF:         Left            Diastology                ventricular     LV e' medial:    7.29 cm/s                ejection        LV E/e' medial:  10.2                fraction by     LV e' lateral:   10.40 cm/s                 PLAX is 65      LV E/e' lateral: 7.2                %. LVIDd:         5.00 cm LVIDs:         3.20 cm LV PW:         1.00 cm LV IVS:        1.10 cm LVOT diam:     2.20 cm LV SV:         70 LV SV Index:   33 LVOT Area:     3.80 cm  RIGHT VENTRICLE RV Basal diam:  2.40 cm RV S prime:     10.90 cm/s TAPSE (M-mode): 2.7 cm LEFT ATRIUM  Index        RIGHT ATRIUM           Index LA diam:      4.10 cm 1.94 cm/m   RA Area:     17.40 cm LA Vol (A2C): 50.2 ml 23.71 ml/m  RA Volume:   45.80 ml  21.63 ml/m LA Vol (A4C): 51.8 ml 24.46 ml/m  AORTIC VALVE AV Area (Vmax):    1.79 cm AV Area (Vmean):   1.68 cm AV Area (VTI):     1.81 cm AV Vmax:           186.00 cm/s AV Vmean:          128.000 cm/s AV VTI:            0.389 m AV Peak Grad:      13.8 mmHg AV Mean Grad:      8.0 mmHg LVOT Vmax:         87.50 cm/s LVOT Vmean:        56.600 cm/s LVOT VTI:          0.185 m LVOT/AV VTI ratio: 0.48  AORTA Ao Root diam: 3.20 cm Ao Asc diam:  3.60 cm MITRAL VALVE MV Area (PHT): 3.65 cm    SHUNTS MV Decel Time: 208 msec    Systemic VTI:  0.18 m MV E velocity: 74.50 cm/s  Systemic Diam: 2.20 cm MV A velocity: 78.20 cm/s MV E/A ratio:  0.95 Lyman Bishop MD Electronically signed by Lyman Bishop MD Signature Date/Time: 01/07/2022/4:54:01 PM    Final    DG Chest 2 View  Result Date: 01/07/2022 CLINICAL DATA:  Shortness of breath. EXAM: CHEST - 2 VIEW COMPARISON:  Two-view chest x-ray 07/20/2021 FINDINGS: Heart size is exaggerated by low lung volumes. Mild pulmonary vascular congestion is present. Bibasilar airspace opacities likely reflect atelectasis without definite consolidation. The upper lung fields are clear. IMPRESSION: 1. Low lung volumes and mild pulmonary vascular congestion. 2. Bibasilar airspace disease likely reflects atelectasis. Electronically Signed   By: San Morelle M.D.   On: 01/07/2022 11:50    Procedures Procedures    CRITICAL CARE Performed by: Gwenyth Allegra Vernis Eid Total  critical care time: 35 minutes Critical care time was exclusive of separately billable procedures and treating other patients. Critical care was necessary to treat or prevent imminent or life-threatening deterioration. Critical care was time spent personally by me on the following activities: development of treatment plan with patient and/or surrogate as well as nursing, discussions with consultants, evaluation of patient's response to treatment, examination of patient, obtaining history from patient or surrogate, ordering and performing treatments and interventions, ordering and review of laboratory studies, ordering and review of radiographic studies, pulse oximetry and re-evaluation of patient's condition.   Medications Ordered in ED Medications  0.9 %  sodium chloride infusion (has no administration in time range)  sodium bicarbonate tablet 1,300 mg (1,300 mg Oral Given 01/07/22 1557)  calcium acetate (PHOSLO) capsule 667 mg (667 mg Oral Given 01/07/22 1734)  atorvastatin (LIPITOR) tablet 20 mg (has no administration in time range)  furosemide (LASIX) tablet 40 mg (has no administration in time range)  gemfibrozil (LOPID) tablet 600 mg (has no administration in time range)  calcitRIOL (ROCALTROL) capsule 0.25 mcg (has no administration in time range)  fesoterodine (TOVIAZ) tablet 4 mg (has no administration in time range)  tamsulosin (FLOMAX) capsule 0.4 mg (has no administration in time range)  ferrous sulfate tablet 325 mg (has no administration in time  range)  sodium zirconium cyclosilicate (LOKELMA) packet 10 g (has no administration in time range)  gabapentin (NEURONTIN) capsule 300 mg (300 mg Oral Given 01/07/22 1558)  polyvinyl alcohol (LIQUIFILM TEARS) 1.4 % ophthalmic solution 1 drop (has no administration in time range)  heparin injection 5,000 Units (5,000 Units Subcutaneous Given 01/07/22 1632)  pantoprazole (PROTONIX) EC tablet 20 mg (20 mg Oral Given 01/07/22 1558)  metoprolol  tartrate (LOPRESSOR) tablet 25 mg (has no administration in time range)  geriatric multivitamins-minerals (ELDERTONIC/GEVRABON) liquid 15 mL (has no administration in time range)  Darbepoetin Alfa (ARANESP) injection 40 mcg (40 mcg Subcutaneous Given 01/07/22 1735)    ED Course/ Medical Decision Making/ A&P                           Medical Decision Making Amount and/or Complexity of Data Reviewed Labs: ordered. Radiology: ordered.  Risk Prescription drug management. Decision regarding hospitalization.    JAKWON GAYTON is a 76 y.o. male with a past medical history significant for renal cell carcinoma, CHF, CKD, diabetes with diabetic polyneuropathy's, hypertension, GERD, and previous admission for symptomatic anemia who presents with exertional shortness of breath and fatigue.  According to patient, for the last week he has had more exertional shortness of breath and fatigue and cannot walk around without getting winded.  He denies any chest pain or palpitations.  Denies any congestion, cough, fevers, chills, nausea, vomiting, constipation, or diarrhea.  Denies any trauma.  Denies any rectal bleeding or stool changes.  Reports this is similar to how he is felt in the past with symptomatic anemia.  He says that last month he received transfusion to try to stay out of the hospital but is now having symptoms again.  He denies any abdominal pain chest pain or back pain.  Denies any discomfort whatsoever.  On my exam, lungs are clear and chest is nontender.  No murmur.  Abdomen nontender.  Patient moving all extremities.  Patient has no complaints while at rest aside from general fatigue.  Patient had work-up starting in triage that revealed his hemoglobin is now down to 6.6 from six-point several weeks ago.  His troponin also returned back at 368 which is much higher than previously.  His creatinine is also worsening but has normal potassium.  Clinically and concerned he is having organ  damage from his symptomatic anemia causing some strain on his heart and kidneys.  We will give blood and get a delta troponin and a BNP.  His chest x-ray shows vascular congestion and atelectasis but no evidence of acute pneumonia.  Given lack of infectious symptoms have low suspicion for new.  Due to rising creatinine, rising troponin, and persistent symptomatic anemia symptoms with exertional worst breath and fatigue, do not feel he safe her discharge home.  We will order blood and call for admission for echo and further work-up.         Final Clinical Impression(s) / ED Diagnoses Final diagnoses:  Symptomatic anemia  Elevated troponin  Exertional shortness of breath    Clinical Impression: 1. Symptomatic anemia   2. Elevated troponin   3. Exertional shortness of breath     Disposition: Admit  This note was prepared with assistance of Dragon voice recognition software. Occasional wrong-word or sound-a-like substitutions may have occurred due to the inherent limitations of voice recognition software.      Jelena Malicoat, Gwenyth Allegra, MD 01/07/22 [phone]

## 2022-01-08 DIAGNOSIS — D649 Anemia, unspecified: Secondary | ICD-10-CM | POA: Diagnosis not present

## 2022-01-08 LAB — BASIC METABOLIC PANEL
Anion gap: 12 (ref 5–15)
BUN: 70 mg/dL — ABNORMAL HIGH (ref 8–23)
CO2: 15 mmol/L — ABNORMAL LOW (ref 22–32)
Calcium: 10 mg/dL (ref 8.9–10.3)
Chloride: 113 mmol/L — ABNORMAL HIGH (ref 98–111)
Creatinine, Ser: 7.07 mg/dL — ABNORMAL HIGH (ref 0.61–1.24)
GFR, Estimated: 8 mL/min — ABNORMAL LOW (ref >60)
Glucose, Bld: 91 mg/dL (ref 70–99)
Potassium: 4.4 mmol/L (ref 3.5–5.1)
Sodium: 140 mmol/L (ref 135–145)

## 2022-01-08 LAB — CBC
HCT: 21.9 % — ABNORMAL LOW (ref 39.0–52.0)
Hemoglobin: 7.7 g/dL — ABNORMAL LOW (ref 13.0–17.0)
MCH: 31 pg (ref 26.0–34.0)
MCHC: 35.2 g/dL (ref 30.0–36.0)
MCV: 88.3 fL (ref 80.0–100.0)
Platelets: 145 10*3/uL — ABNORMAL LOW (ref 150–400)
RBC: 2.48 MIL/uL — ABNORMAL LOW (ref 4.22–5.81)
RDW: 15.8 % — ABNORMAL HIGH (ref 11.5–15.5)
WBC: 4 10*3/uL (ref 4.0–10.5)
nRBC: 0 % (ref 0.0–0.2)

## 2022-01-08 MED ORDER — PANTOPRAZOLE SODIUM 20 MG PO TBEC: 20.0000 mg | DELAYED_RELEASE_TABLET | Freq: Every day | ORAL | 0 refills | Status: DC

## 2022-01-08 NOTE — Discharge Summary (Signed)
Physician Discharge Summary  Antonio Adams OBS:[id number] DOB: [birth year] DOA: 01/07/2022  PCP: [name], MD  Admit date: 01/07/2022 Discharge date: 01/08/2022  Admitted From: Home Disposition: Home  Recommendations for Outpatient Follow-up:  Follow up with PCP in 1-2 weeks Follow-up with nephrology as discussed later this week  Home Health: None Equipment/Devices: None  Discharge Condition: Stable CODE STATUS: Full Diet recommendation: Low-salt low-fat renal diet  Brief/Interim Summary: Antonio Adams is a 76 y.o. male with medical history significant of CKD stage V, chronic iron deficiency anemia and chronic anemia secondary to CKD, IIDM with diet control, BPH, diabetic neuropathy, prostate and renal cell carcinoma status post radiation therapy, lower GI bleed secondary to radiation proctitis status post APC 2018, duodenum carcinoid, metastatic renal cancer off chemo/immunotherapy, on surveillance, presented with increasing shortness of breath -patient was found to be profoundly anemic.  Patient symptoms resolved with transfusion, his anemia appears to be acute on chronic secondary to advanced chronic kidney disease.  FOBT negative making GI bleed unlikely despite recent evidence of radiation proctitis.  At this time patient's symptoms have resolved in setting of transfusion, appears to be back to baseline per wife at bedside.  We discussed close follow-up with nephrology for further discussion about EPO, which they were interested in, given his likely bone marrow suppression in the setting of advanced renal disease.  Recent outpatient iron studies appear to be within normal limits.  Incidentally due to patient's vague symptoms at intake he was evaluated for cardiac etiology with elevated troponins again in the setting of CKD5 and stable this is likely his baseline.  Echocardiogram unremarkable for any overt wall motion abnormality with an EF of 60 to 65% and grade 1  diastolic dysfunction.  No further cardiac work-up indicated at this time, patient otherwise stable and agreeable for discharge home given his return to baseline.    Discharge Diagnoses:  Principal Problem:   Symptomatic anemia Active Problems:   CKD (chronic kidney disease) stage 5, GFR less than 15 ml/min (HCC)   Anemia due to chronic kidney disease   GERD   ADENOCARCINOMA, PROSTATE, GLEASON GRADE 6   DDD (degenerative disc disease), cervical   BPH (benign prostatic hyperplasia)   Chronic heart failure with preserved ejection fraction (HFpEF) ([place])    Discharge Instructions   Allergies as of 01/08/2022       Reactions   Ace Inhibitors Swelling   lisinopril   Xyzal [levocetirizine] Other (See Comments)   Break out   Opdivo [nivolumab] Rash        Medication List     TAKE these medications    atorvastatin 20 MG tablet Commonly known as: LIPITOR Take 1 tablet (20 mg total) by mouth at bedtime.   B-12 PO Take 1 tablet by mouth daily.   calcitRIOL 0.25 MCG capsule Commonly known as: ROCALTROL Take 0.25 mcg by mouth daily.   ferrous sulfate 325 (65 FE) MG tablet Take 1 tablet (325 mg total) by mouth 2 (two) times daily. Patient needs office visit for further refills What changed: additional instructions   FISH OIL PO Take 1 tablet by mouth daily.   fluocinonide-emollient 0.05 % cream Commonly known as: LIDEX-E Apply 1 Application topically daily.   furosemide 40 MG tablet Commonly known as: LASIX TAKE 1 TABLET BY MOUTH EVERY DAY   gabapentin 300 MG capsule Commonly known as: NEURONTIN Take 300 mg by mouth 3 (three) times daily.   Geritol Liqd Take 10 mLs by mouth daily.  Lokelma 10 g Pack packet Generic drug: sodium zirconium cyclosilicate Take 10 g by mouth daily.   megestrol 40 MG/ML suspension Commonly known as: MEGACE TAKE 10 MLS (400 MG TOTAL) BY MOUTH DAILY. What changed: See the new instructions.   metoprolol tartrate 25 MG  tablet Commonly known as: LOPRESSOR Take 1 tablet (25 mg total) by mouth 2 (two) times daily.   OVER THE COUNTER MEDICATION Take 1 tablet by mouth daily. Focus factor   pantoprazole 20 MG tablet Commonly known as: PROTONIX Take 1 tablet (20 mg total) by mouth daily.   polyvinyl alcohol 1.4 % ophthalmic solution Commonly known as: LIQUIFILM TEARS Place 1 drop into both eyes daily as needed for dry eyes.   PREVAGEN EXTRA STRENGTH PO Take 1 tablet by mouth daily.   solifenacin 5 MG tablet Commonly known as: VESICARE TAKE 1 TABLET (5 MG TOTAL) BY MOUTH DAILY.   tamsulosin 0.4 MG Caps capsule Commonly known as: FLOMAX TAKE 1 CAPSULE (0.4 MG TOTAL) BY MOUTH DAILY.   zinc gluconate 50 MG tablet Take 1 tablet (50 mg total) by mouth daily.        Allergies  Allergen Reactions   Ace Inhibitors Swelling    lisinopril   Xyzal [Levocetirizine] Other (See Comments)    Break out   Opdivo [Nivolumab] Rash    Consultations: None  Procedures/Studies: ECHOCARDIOGRAM COMPLETE  Result Date: 01/07/2022    ECHOCARDIOGRAM REPORT   Patient Name:   Antonio Adams Date of Exam: 01/07/2022 Medical Rec #:  [id number]           Height:       71.0 in Accession #:    [id number]          Weight:       202.0 lb Date of Birth:  1945/11/12          BSA:          2.117 m Patient Age:    76 years            BP:           118/75 mmHg Patient Gender: M                   HR:           66 bpm. Exam Location:  Inpatient Procedure: 2D Echo, Cardiac Doppler and Color Doppler Indications:    Elevated troponins  History:        Patient has prior history of Echocardiogram examinations, most                 recent 07/21/2021. CHF, Signs/Symptoms:Shortness of Breath; Risk                 Factors:Sleep Apnea, Hypertension, Diabetes and Dyslipidemia.  Sonographer:    Merrie Roof RDCS Referring Phys: Lequita Halt IMPRESSIONS  1. Left ventricular ejection fraction, by estimation, is 60 to 65%. Left ventricular ejection  fraction by PLAX is 65 %. The left ventricle has normal function. The left ventricle has no regional wall motion abnormalities. Left ventricular diastolic parameters are consistent with Grade I diastolic dysfunction (impaired relaxation).  2. Right ventricular systolic function is low normal. The right ventricular size is normal. Tricuspid regurgitation signal is inadequate for assessing PA pressure.  3. The mitral valve is abnormal. Trivial mitral valve regurgitation.  4. The aortic valve is tricuspid. Aortic valve regurgitation is not visualized. Mild aortic valve stenosis. Aortic valve area, by VTI measures 1.81  cm. Aortic valve mean gradient measures 8.0 mmHg. Aortic valve Vmax measures 1.86 m/s. DI is 0.48, Peak gradient 14 mmHg, based on LVOT diameter of 2.2 cm.  5. The inferior vena cava is normal in size with greater than 50% respiratory variability, suggesting right atrial pressure of 3 mmHg. Comparison(s): Changes from prior study are noted. 07/21/2021: LVEF 48%, global hypokinesis. FINDINGS  Left Ventricle: Left ventricular ejection fraction, by estimation, is 60 to 65%. Left ventricular ejection fraction by PLAX is 65 %. The left ventricle has normal function. The left ventricle has no regional wall motion abnormalities. The left ventricular internal cavity size was normal in size. There is no left ventricular hypertrophy. Left ventricular diastolic parameters are consistent with Grade I diastolic dysfunction (impaired relaxation). Indeterminate filling pressures. Right Ventricle: The right ventricular size is normal. No increase in right ventricular wall thickness. Right ventricular systolic function is low normal. Tricuspid regurgitation signal is inadequate for assessing PA pressure. Left Atrium: Left atrial size was normal in size. Right Atrium: Right atrial size was normal in size. Pericardium: There is no evidence of pericardial effusion. Mitral Valve: The mitral valve is abnormal. There is mild  thickening of the anterior mitral valve leaflet(s). Trivial mitral valve regurgitation. Tricuspid Valve: The tricuspid valve is grossly normal. Tricuspid valve regurgitation is trivial. Aortic Valve: The aortic valve is tricuspid. Aortic valve regurgitation is not visualized. Mild aortic stenosis is present. Aortic valve mean gradient measures 8.0 mmHg. Aortic valve peak gradient measures 13.8 mmHg. Aortic valve area, by VTI measures 1.81 cm. Pulmonic Valve: The pulmonic valve was normal in structure. Pulmonic valve regurgitation is not visualized. Aorta: The aortic root and ascending aorta are structurally normal, with no evidence of dilitation. Venous: The inferior vena cava is normal in size with greater than 50% respiratory variability, suggesting right atrial pressure of 3 mmHg. IAS/Shunts: No atrial level shunt detected by color flow Doppler.  LEFT VENTRICLE PLAX 2D LV EF:         Left            Diastology                ventricular     LV e' medial:    7.29 cm/s                ejection        LV E/e' medial:  10.2                fraction by     LV e' lateral:   10.40 cm/s                PLAX is 65      LV E/e' lateral: 7.2                %. LVIDd:         5.00 cm LVIDs:         3.20 cm LV PW:         1.00 cm LV IVS:        1.10 cm LVOT diam:     2.20 cm LV SV:         70 LV SV Index:   33 LVOT Area:     3.80 cm  RIGHT VENTRICLE RV Basal diam:  2.40 cm RV S prime:     10.90 cm/s TAPSE (M-mode): 2.7 cm LEFT ATRIUM           Index  RIGHT ATRIUM           Index LA diam:      4.10 cm 1.94 cm/m   RA Area:     17.40 cm LA Vol (A2C): 50.2 ml 23.71 ml/m  RA Volume:   45.80 ml  21.63 ml/m LA Vol (A4C): 51.8 ml 24.46 ml/m  AORTIC VALVE AV Area (Vmax):    1.79 cm AV Area (Vmean):   1.68 cm AV Area (VTI):     1.81 cm AV Vmax:           186.00 cm/s AV Vmean:          128.000 cm/s AV VTI:            0.389 m AV Peak Grad:      13.8 mmHg AV Mean Grad:      8.0 mmHg LVOT Vmax:         87.50 cm/s LVOT Vmean:         56.600 cm/s LVOT VTI:          0.185 m LVOT/AV VTI ratio: 0.48  AORTA Ao Root diam: 3.20 cm Ao Asc diam:  3.60 cm MITRAL VALVE MV Area (PHT): 3.65 cm    SHUNTS MV Decel Time: 208 msec    Systemic VTI:  0.18 m MV E velocity: 74.50 cm/s  Systemic Diam: 2.20 cm MV A velocity: 78.20 cm/s MV E/A ratio:  0.95 Lyman Bishop MD Electronically signed by Lyman Bishop MD Signature Date/Time: 01/07/2022/4:54:01 PM    Final    DG Chest 2 View  Result Date: 01/07/2022 CLINICAL DATA:  Shortness of breath. EXAM: CHEST - 2 VIEW COMPARISON:  Two-view chest x-ray 07/20/2021 FINDINGS: Heart size is exaggerated by low lung volumes. Mild pulmonary vascular congestion is present. Bibasilar airspace opacities likely reflect atelectasis without definite consolidation. The upper lung fields are clear. IMPRESSION: 1. Low lung volumes and mild pulmonary vascular congestion. 2. Bibasilar airspace disease likely reflects atelectasis. Electronically Signed   By: San Morelle M.D.   On: 01/07/2022 11:50     Subjective: No acute issues or events overnight   Discharge Exam: Vitals:   01/08/22 0508 01/08/22 0841  BP: (!) 157/90 (!) 158/84  Pulse: 68 67  Resp: 18 15  Temp: 97.6 F (36.4 C) 98.1 F (36.7 C)  SpO2: 100% 100%   Vitals:   01/07/22 2120 01/08/22 0015 01/08/22 0508 01/08/22 0841  BP: (!) 156/70 (!) 175/80 (!) 157/90 (!) 158/84  Pulse: 62 (!) 57 68 67  Resp: '20 18 18 15  '$ Temp: 98.5 F (36.9 C) 98.7 F (37.1 C) 97.6 F (36.4 C) 98.1 F (36.7 C)  TempSrc: Oral Oral Oral Oral  SpO2: 100% 100% 100% 100%    General: Pt is alert, awake, not in acute distress Cardiovascular: RRR, S1/S2 +, no rubs, no gallops Respiratory: CTA bilaterally, no wheezing, no rhonchi Abdominal: Soft, NT, ND, bowel sounds + Extremities: no edema, no cyanosis    The results of significant diagnostics from this hospitalization (including imaging, microbiology, ancillary and laboratory) are listed below for reference.      Microbiology: No results found for this or any previous visit (from the past 240 hour(s)).   Labs: BNP (last 3 results) Recent Labs    01/07/22 1131  BNP 409.8*   Basic Metabolic Panel: Recent Labs  Lab 01/07/22 1131 01/08/22 0032  NA 140 140  K 3.9 4.4  CL 112* 113*  CO2 14* 15*  GLUCOSE 142* 91  BUN  71* 70*  CREATININE 7.14* 7.07*  CALCIUM 9.8 10.0   Liver Function Tests: No results for input(s): "AST", "ALT", "ALKPHOS", "BILITOT", "PROT", "ALBUMIN" in the last 168 hours. No results for input(s): "LIPASE", "AMYLASE" in the last 168 hours. No results for input(s): "AMMONIA" in the last 168 hours. CBC: Recent Labs  Lab 01/07/22 1131 01/08/22 0032  WBC 3.9* 4.0  HGB 6.6* 7.7*  HCT 19.4* 21.9*  MCV 91.5 88.3  PLT 164 145*   Cardiac Enzymes: No results for input(s): "CKTOTAL", "CKMB", "CKMBINDEX", "TROPONINI" in the last 168 hours. BNP: Invalid input(s): "POCBNP" CBG: No results for input(s): "GLUCAP" in the last 168 hours. D-Dimer No results for input(s): "DDIMER" in the last 72 hours. Hgb A1c No results for input(s): "HGBA1C" in the last 72 hours. Lipid Profile No results for input(s): "CHOL", "HDL", "LDLCALC", "TRIG", "CHOLHDL", "LDLDIRECT" in the last 72 hours. Thyroid function studies No results for input(s): "TSH", "T4TOTAL", "T3FREE", "THYROIDAB" in the last 72 hours.  Invalid input(s): "FREET3" Anemia work up No results for input(s): "VITAMINB12", "FOLATE", "FERRITIN", "TIBC", "IRON", "RETICCTPCT" in the last 72 hours. Urinalysis    Component Value Date/Time   COLORURINE STRAW (A) 05/23/2021 0902   APPEARANCEUR CLEAR 05/23/2021 0902   LABSPEC 1.012 05/23/2021 0902   PHURINE 5.0 05/23/2021 0902   GLUCOSEU NEGATIVE 05/23/2021 0902   GLUCOSEU 250 (A) 05/20/2020 1606   HGBUR MODERATE (A) 05/23/2021 0902   BILIRUBINUR NEGATIVE 05/23/2021 0902   BILIRUBINUR neg 03/21/2012 1127   KETONESUR NEGATIVE 05/23/2021 0902   PROTEINUR 100 (A)  05/23/2021 0902   UROBILINOGEN 0.2 05/20/2020 1606   NITRITE NEGATIVE 05/23/2021 0902   LEUKOCYTESUR NEGATIVE 05/23/2021 0902   Sepsis Labs Recent Labs  Lab 01/07/22 1131 01/08/22 0032  WBC 3.9* 4.0   Microbiology No results found for this or any previous visit (from the past 240 hour(s)).   Time coordinating discharge: Over 30 minutes  SIGNED:   Little Ishikawa, DO [facility] 01/08/2022, 3:22 PM Pager   If 7PM-7AM, please contact night-coverage [email or web address]

## 2022-01-08 NOTE — Progress Notes (Signed)
Patient given discharge instructions and stated understanding. 

## 2022-01-08 NOTE — Plan of Care (Signed)

## 2022-01-11 ENCOUNTER — Telehealth: Payer: Self-pay | Admitting: Oncology

## 2022-01-11 LAB — TYPE AND SCREEN
ABO/RH(D): O POS
Antibody Screen: NEGATIVE
Donor AG Type: NEGATIVE
Donor AG Type: NEGATIVE
Unit division: 0
Unit division: 0

## 2022-01-11 LAB — BPAM RBC
Blood Product Expiration Date: 202309292359
Blood Product Expiration Date: 202309292359
ISSUE DATE / TIME: 202308261621
Unit Type and Rh: 5100
Unit Type and Rh: 5100

## 2022-01-11 NOTE — Telephone Encounter (Signed)
Called patient regarding upcoming December appointment, patient is notified. 

## 2022-01-18 ENCOUNTER — Other Ambulatory Visit: Payer: Self-pay | Admitting: Oncology

## 2022-01-23 ENCOUNTER — Telehealth: Payer: Self-pay

## 2022-01-23 ENCOUNTER — Encounter: Payer: Self-pay | Admitting: Internal Medicine

## 2022-01-23 ENCOUNTER — Ambulatory Visit (INDEPENDENT_AMBULATORY_CARE_PROVIDER_SITE_OTHER): Payer: Medicare Other | Admitting: Internal Medicine

## 2022-01-23 VITALS — BP 144/82 | HR 70 | Temp 97.9°F | Ht 71.0 in | Wt 209.0 lb

## 2022-01-23 DIAGNOSIS — D631 Anemia in chronic kidney disease: Secondary | ICD-10-CM | POA: Diagnosis not present

## 2022-01-23 DIAGNOSIS — G893 Neoplasm related pain (acute) (chronic): Secondary | ICD-10-CM | POA: Diagnosis not present

## 2022-01-23 DIAGNOSIS — Z0001 Encounter for general adult medical examination with abnormal findings: Secondary | ICD-10-CM

## 2022-01-23 DIAGNOSIS — N185 Chronic kidney disease, stage 5: Secondary | ICD-10-CM

## 2022-01-23 DIAGNOSIS — I10 Essential (primary) hypertension: Secondary | ICD-10-CM | POA: Diagnosis not present

## 2022-01-23 DIAGNOSIS — E1142 Type 2 diabetes mellitus with diabetic polyneuropathy: Secondary | ICD-10-CM

## 2022-01-23 DIAGNOSIS — Z0000 Encounter for general adult medical examination without abnormal findings: Secondary | ICD-10-CM | POA: Diagnosis not present

## 2022-01-23 LAB — CBC WITH DIFFERENTIAL/PLATELET
Basophils Absolute: 0 10*3/uL (ref 0.0–0.1)
Basophils Relative: 0.9 % (ref 0.0–3.0)
Eosinophils Absolute: 0.1 10*3/uL (ref 0.0–0.7)
Eosinophils Relative: 2.2 % (ref 0.0–5.0)
HCT: 21.6 % — CL (ref 39.0–52.0)
Hemoglobin: 7.6 g/dL — CL (ref 13.0–17.0)
Lymphocytes Relative: 14.8 % (ref 12.0–46.0)
Lymphs Abs: 0.6 10*3/uL — ABNORMAL LOW (ref 0.7–4.0)
MCHC: 35.2 g/dL (ref 30.0–36.0)
MCV: 89.8 fl (ref 78.0–100.0)
Monocytes Absolute: 0.6 10*3/uL (ref 0.1–1.0)
Monocytes Relative: 13.1 % — ABNORMAL HIGH (ref 3.0–12.0)
Neutro Abs: 3 10*3/uL (ref 1.4–7.7)
Neutrophils Relative %: 69 % (ref 43.0–77.0)
Platelets: 176 10*3/uL (ref 150.0–400.0)
RBC: 2.4 Mil/uL — ABNORMAL LOW (ref 4.22–5.81)
RDW: 16.3 % — ABNORMAL HIGH (ref 11.5–15.5)
WBC: 4.3 10*3/uL (ref 4.0–10.5)

## 2022-01-23 MED ORDER — XTAMPZA ER 9 MG PO C12A: 1.0000 | EXTENDED_RELEASE_CAPSULE | Freq: Two times a day (BID) | ORAL | 0 refills | Status: DC | PRN

## 2022-01-23 NOTE — Telephone Encounter (Signed)
CRITICAL VALUE STICKER  CRITICAL VALUE: Hemoglobin 7.6 and Hematocrit  21.6  RECEIVER (on-site recipient of call): Verdis Frederickson  DATE & TIME NOTIFIED: 01/23/2022  MESSENGER (representative from lab): Santiago Glad  MD NOTIFIED: YES  TIME OF NOTIFICATION: 4:08 pm  RESPONSE:  He will inform the pt

## 2022-01-23 NOTE — Progress Notes (Signed)
Subjective:  Patient ID: Antonio Adams, male    DOB: Jun 19, 1945  Age: 76 y.o. MRN: [id number]  CC: Annual Exam and Anemia   HPI Antonio Adams presents for a CPX and f/up -   He was recently admitted for symptomatic anemia.  He received an infusion and tells me that he has started Epogen.  He continues to complain of a sticking pain in his right flank that is constant.  There was some concern that it was neuropathy so he tried gabapentin but it was not successful.  The pain keeps him awake at night and interferes with his daily activities.  PCP: [name], MD   Admit date: 01/07/2022 Discharge date: 01/08/2022   Admitted From: Home Disposition: Home   Recommendations for Outpatient Follow-up:  Follow up with PCP in 1-2 weeks Follow-up with nephrology as discussed later this week   Home Health: None Equipment/Devices: None   Discharge Condition: Stable CODE STATUS: Full Diet recommendation: Low-salt low-fat renal diet   Brief/Interim Summary: Antonio Adams is a 76 y.o. male with medical history significant of CKD stage V, chronic iron deficiency anemia and chronic anemia secondary to CKD, IIDM with diet control, BPH, diabetic neuropathy, prostate and renal cell carcinoma status post radiation therapy, lower GI bleed secondary to radiation proctitis status post APC 2018, duodenum carcinoid, metastatic renal cancer off chemo/immunotherapy, on surveillance, presented with increasing shortness of breath -patient was found to be profoundly anemic.   Patient symptoms resolved with transfusion, his anemia appears to be acute on chronic secondary to advanced chronic kidney disease.  FOBT negative making GI bleed unlikely despite recent evidence of radiation proctitis.  At this time patient's symptoms have resolved in setting of transfusion, appears to be back to baseline per wife at bedside.  We discussed close follow-up with nephrology for further discussion about  EPO, which they were interested in, given his likely bone marrow suppression in the setting of advanced renal disease.  Recent outpatient iron studies appear to be within normal limits.    Outpatient Medications Prior to Visit  Medication Sig Dispense Refill   Apoaequorin (PREVAGEN EXTRA STRENGTH PO) Take 1 tablet by mouth daily.     calcitRIOL (ROCALTROL) 0.25 MCG capsule Take 0.25 mcg by mouth daily.     Cyanocobalamin (B-12 PO) Take 1 tablet by mouth daily.     ferrous sulfate 325 (65 FE) MG tablet Take 1 tablet (325 mg total) by mouth 2 (two) times daily. Patient needs office visit for further refills (Patient taking differently: Take 325 mg by mouth 2 (two) times daily.) 60 tablet 1   furosemide (LASIX) 40 MG tablet TAKE 1 TABLET BY MOUTH EVERY DAY (Patient taking differently: Take 40 mg by mouth daily.) 90 tablet 1   Iron-Vitamins (GERITOL) LIQD Take 10 mLs by mouth daily.     LOKELMA 10 g PACK packet Take 10 g by mouth daily.     megestrol (MEGACE) 40 MG/ML suspension TAKE 10 MLS (400 MG TOTAL) BY MOUTH DAILY. 240 mL 0   Omega-3 Fatty Acids (FISH OIL PO) Take 1 tablet by mouth daily.     OVER THE COUNTER MEDICATION Take 1 tablet by mouth daily. Focus factor     pantoprazole (PROTONIX) 20 MG tablet Take 1 tablet (20 mg total) by mouth daily. 30 tablet 0   polyvinyl alcohol (LIQUIFILM TEARS) 1.4 % ophthalmic solution Place 1 drop into both eyes daily as needed for dry eyes.     solifenacin (  VESICARE) 5 MG tablet TAKE 1 TABLET (5 MG TOTAL) BY MOUTH DAILY. 90 tablet 1   tamsulosin (FLOMAX) 0.4 MG CAPS capsule TAKE 1 CAPSULE (0.4 MG TOTAL) BY MOUTH DAILY. (Patient taking differently: Take 0.4 mg by mouth daily.) 30 capsule 1   zinc gluconate 50 MG tablet Take 1 tablet (50 mg total) by mouth daily. 90 tablet 1   fluocinonide-emollient (LIDEX-E) 0.05 % cream Apply 1 Application topically daily.     gabapentin (NEURONTIN) 300 MG capsule Take 300 mg by mouth 3 (three) times daily.      atorvastatin (LIPITOR) 20 MG tablet Take 1 tablet (20 mg total) by mouth at bedtime. 30 tablet 0   metoprolol tartrate (LOPRESSOR) 25 MG tablet Take 1 tablet (25 mg total) by mouth 2 (two) times daily. 180 tablet 3   No facility-administered medications prior to visit.    ROS Review of Systems  Constitutional:  Positive for fatigue. Negative for diaphoresis and unexpected weight change.  HENT: Negative.    Eyes: Negative.   Respiratory:  Negative for cough, chest tightness, shortness of breath and wheezing.   Cardiovascular:  Negative for chest pain, palpitations and leg swelling.  Gastrointestinal:  Negative for abdominal pain, constipation, diarrhea, nausea and vomiting.  Endocrine: Negative.   Genitourinary:  Positive for flank pain. Negative for difficulty urinating and dysuria.  Musculoskeletal:  Negative for myalgias.  Skin: Negative.  Negative for color change.  Neurological:  Negative for dizziness, weakness, light-headedness and headaches.  Hematological:  Negative for adenopathy. Does not bruise/bleed easily.  Psychiatric/Behavioral: Negative.      Objective:  BP (!) 144/82 (BP Location: Left Arm, Patient Position: Sitting, Cuff Size: Large)   Pulse 70   Temp 97.9 F (36.6 C) (Oral)   Ht '5\' 11"'$  (1.803 m)   Wt 209 lb (94.8 kg)   SpO2 99%   BMI 29.15 kg/m   BP Readings from Last 3 Encounters:  01/23/22 (!) 144/82  01/08/22 (!) 158/84  12/15/21 138/76    Wt Readings from Last 3 Encounters:  01/23/22 209 lb (94.8 kg)  01/06/22 202 lb (91.6 kg)  12/15/21 220 lb (99.8 kg)    Physical Exam Vitals reviewed.  Constitutional:      Appearance: He is not ill-appearing.  HENT:     Mouth/Throat:     Mouth: Mucous membranes are moist.  Eyes:     General: No scleral icterus.    Conjunctiva/sclera: Conjunctivae normal.  Cardiovascular:     Rate and Rhythm: Normal rate and regular rhythm.     Heart sounds: No murmur heard.    No gallop.  Pulmonary:     Effort:  Pulmonary effort is normal. No tachypnea or respiratory distress.     Breath sounds: Examination of the right-lower field reveals rales. Examination of the left-lower field reveals rales. Rales present. No decreased breath sounds, wheezing or rhonchi.  Abdominal:     General: Abdomen is flat.     Palpations: There is no hepatomegaly, splenomegaly or mass.     Tenderness: There is abdominal tenderness in the right upper quadrant. There is no guarding.     Hernia: No hernia is present.  Musculoskeletal:        General: Normal range of motion.     Cervical back: Neck supple.     Right lower leg: No edema.     Left lower leg: No edema.  Lymphadenopathy:     Cervical: No cervical adenopathy.  Skin:    General: Skin  is warm and dry.  Neurological:     General: No focal deficit present.     Mental Status: He is alert.  Psychiatric:        Mood and Affect: Mood normal.        Behavior: Behavior normal.     Lab Results  Component Value Date   WBC 4.3 01/23/2022   HGB 7.6 Repeated and verified X2. (LL) 01/23/2022   HCT 21.6 Repeated and verified X2. (LL) 01/23/2022   PLT 176.0 01/23/2022   GLUCOSE 91 01/08/2022   CHOL 136 07/22/2021   TRIG 46 07/22/2021   HDL 34 (L) 07/22/2021   LDLCALC 93 07/22/2021   ALT 17 11/18/2021   AST 28 11/18/2021   NA 140 01/08/2022   K 4.4 01/08/2022   CL 113 (H) 01/08/2022   CREATININE 7.07 (H) 01/08/2022   BUN 70 (H) 01/08/2022   CO2 15 (L) 01/08/2022   TSH 2.577 10/04/2021   PSA 0.01 (L) 05/20/2020   INR 1.2 05/24/2021   HGBA1C 5.8 (H) 05/23/2021   MICROALBUR 97.0 12/18/2019    ECHOCARDIOGRAM COMPLETE  Result Date: 01/07/2022    ECHOCARDIOGRAM REPORT   Patient Name:   NADER BOYS Date of Exam: 01/07/2022 Medical Rec #:  [id number]           Height:       71.0 in Accession #:    [id number]          Weight:       202.0 lb Date of Birth:  07/31/1945          BSA:          2.117 m Patient Age:    89 years            BP:           118/75  mmHg Patient Gender: M                   HR:           66 bpm. Exam Location:  Inpatient Procedure: 2D Echo, Cardiac Doppler and Color Doppler Indications:    Elevated troponins  History:        Patient has prior history of Echocardiogram examinations, most                 recent 07/21/2021. CHF, Signs/Symptoms:Shortness of Breath; Risk                 Factors:Sleep Apnea, Hypertension, Diabetes and Dyslipidemia.  Sonographer:    Merrie Roof RDCS Referring Phys: Lequita Halt IMPRESSIONS  1. Left ventricular ejection fraction, by estimation, is 60 to 65%. Left ventricular ejection fraction by PLAX is 65 %. The left ventricle has normal function. The left ventricle has no regional wall motion abnormalities. Left ventricular diastolic parameters are consistent with Grade I diastolic dysfunction (impaired relaxation).  2. Right ventricular systolic function is low normal. The right ventricular size is normal. Tricuspid regurgitation signal is inadequate for assessing PA pressure.  3. The mitral valve is abnormal. Trivial mitral valve regurgitation.  4. The aortic valve is tricuspid. Aortic valve regurgitation is not visualized. Mild aortic valve stenosis. Aortic valve area, by VTI measures 1.81 cm. Aortic valve mean gradient measures 8.0 mmHg. Aortic valve Vmax measures 1.86 m/s. DI is 0.48, Peak gradient 14 mmHg, based on LVOT diameter of 2.2 cm.  5. The inferior vena cava is normal in size with greater than 50% respiratory variability, suggesting right  atrial pressure of 3 mmHg. Comparison(s): Changes from prior study are noted. 07/21/2021: LVEF 48%, global hypokinesis. FINDINGS  Left Ventricle: Left ventricular ejection fraction, by estimation, is 60 to 65%. Left ventricular ejection fraction by PLAX is 65 %. The left ventricle has normal function. The left ventricle has no regional wall motion abnormalities. The left ventricular internal cavity size was normal in size. There is no left ventricular hypertrophy. Left  ventricular diastolic parameters are consistent with Grade I diastolic dysfunction (impaired relaxation). Indeterminate filling pressures. Right Ventricle: The right ventricular size is normal. No increase in right ventricular wall thickness. Right ventricular systolic function is low normal. Tricuspid regurgitation signal is inadequate for assessing PA pressure. Left Atrium: Left atrial size was normal in size. Right Atrium: Right atrial size was normal in size. Pericardium: There is no evidence of pericardial effusion. Mitral Valve: The mitral valve is abnormal. There is mild thickening of the anterior mitral valve leaflet(s). Trivial mitral valve regurgitation. Tricuspid Valve: The tricuspid valve is grossly normal. Tricuspid valve regurgitation is trivial. Aortic Valve: The aortic valve is tricuspid. Aortic valve regurgitation is not visualized. Mild aortic stenosis is present. Aortic valve mean gradient measures 8.0 mmHg. Aortic valve peak gradient measures 13.8 mmHg. Aortic valve area, by VTI measures 1.81 cm. Pulmonic Valve: The pulmonic valve was normal in structure. Pulmonic valve regurgitation is not visualized. Aorta: The aortic root and ascending aorta are structurally normal, with no evidence of dilitation. Venous: The inferior vena cava is normal in size with greater than 50% respiratory variability, suggesting right atrial pressure of 3 mmHg. IAS/Shunts: No atrial level shunt detected by color flow Doppler.  LEFT VENTRICLE PLAX 2D LV EF:         Left            Diastology                ventricular     LV e' medial:    7.29 cm/s                ejection        LV E/e' medial:  10.2                fraction by     LV e' lateral:   10.40 cm/s                PLAX is 65      LV E/e' lateral: 7.2                %. LVIDd:         5.00 cm LVIDs:         3.20 cm LV PW:         1.00 cm LV IVS:        1.10 cm LVOT diam:     2.20 cm LV SV:         70 LV SV Index:   33 LVOT Area:     3.80 cm  RIGHT VENTRICLE RV  Basal diam:  2.40 cm RV S prime:     10.90 cm/s TAPSE (M-mode): 2.7 cm LEFT ATRIUM           Index        RIGHT ATRIUM           Index LA diam:      4.10 cm 1.94 cm/m   RA Area:     17.40 cm LA Vol (A2C): 50.2 ml 23.71 ml/m  RA  Volume:   45.80 ml  21.63 ml/m LA Vol (A4C): 51.8 ml 24.46 ml/m  AORTIC VALVE AV Area (Vmax):    1.79 cm AV Area (Vmean):   1.68 cm AV Area (VTI):     1.81 cm AV Vmax:           186.00 cm/s AV Vmean:          128.000 cm/s AV VTI:            0.389 m AV Peak Grad:      13.8 mmHg AV Mean Grad:      8.0 mmHg LVOT Vmax:         87.50 cm/s LVOT Vmean:        56.600 cm/s LVOT VTI:          0.185 m LVOT/AV VTI ratio: 0.48  AORTA Ao Root diam: 3.20 cm Ao Asc diam:  3.60 cm MITRAL VALVE MV Area (PHT): 3.65 cm    SHUNTS MV Decel Time: 208 msec    Systemic VTI:  0.18 m MV E velocity: 74.50 cm/s  Systemic Diam: 2.20 cm MV A velocity: 78.20 cm/s MV E/A ratio:  0.95 Lyman Bishop MD Electronically signed by Lyman Bishop MD Signature Date/Time: 01/07/2022/4:54:01 PM    Final    DG Chest 2 View  Result Date: 01/07/2022 CLINICAL DATA:  Shortness of breath. EXAM: CHEST - 2 VIEW COMPARISON:  Two-view chest x-ray 07/20/2021 FINDINGS: Heart size is exaggerated by low lung volumes. Mild pulmonary vascular congestion is present. Bibasilar airspace opacities likely reflect atelectasis without definite consolidation. The upper lung fields are clear. IMPRESSION: 1. Low lung volumes and mild pulmonary vascular congestion. 2. Bibasilar airspace disease likely reflects atelectasis. Electronically Signed   By: San Morelle M.D.   On: 01/07/2022 11:50    Assessment & Plan:   Precious was seen today for annual exam and anemia.  Diagnoses and all orders for this visit:  Anemia due to stage 5 chronic kidney disease, not on chronic dialysis [facility])- His H&H are stable.  He will continue Epogen. -     CBC with Differential/Platelet; Future -     CBC with Differential/Platelet  Encounter for general  adult medical examination with abnormal findings- Exam completed, labs reviewed, vaccines reviewed and updated, no cancer screenings indicated, patient education was given.  Cancer related pain -     oxyCODONE ER (XTAMPZA ER) 9 MG C12A; Take 1 tablet by mouth 2 (two) times daily as needed.  Diabetic polyneuropathy associated with type 2 diabetes mellitus (HCC) -     oxyCODONE ER (XTAMPZA ER) 9 MG C12A; Take 1 tablet by mouth 2 (two) times daily as needed.  Essential hypertension- His blood pressure is adequately well controlled.   I have discontinued Otila Back. Haire's gabapentin and fluocinonide-emollient. I am also having him start on Xtampza ER. Additionally, I am having him maintain his tamsulosin, ferrous sulfate, polyvinyl alcohol, Omega-3 Fatty Acids (FISH OIL PO), OVER THE COUNTER MEDICATION, Lokelma, atorvastatin, Apoaequorin (PREVAGEN EXTRA STRENGTH PO), Geritol, metoprolol tartrate, furosemide, solifenacin, zinc gluconate, calcitRIOL, Cyanocobalamin (B-12 PO), pantoprazole, and megestrol.  Meds ordered this encounter  Medications   oxyCODONE ER (XTAMPZA ER) 9 MG C12A    Sig: Take 1 tablet by mouth 2 (two) times daily as needed.    Dispense:  60 capsule    Refill:  0     Follow-up: Return in about 3 months (around 04/24/2022).  Scarlette Calico, MD

## 2022-01-23 NOTE — Patient Instructions (Signed)
Health Maintenance, Male Adopting a healthy lifestyle and getting preventive care are important in promoting health and wellness. Ask your health care provider about: The right schedule for you to have regular tests and exams. Things you can do on your own to prevent diseases and keep yourself healthy. What should I know about diet, weight, and exercise? Eat a healthy diet  Eat a diet that includes plenty of vegetables, fruits, low-fat dairy products, and lean protein. Do not eat a lot of foods that are high in solid fats, added sugars, or sodium. Maintain a healthy weight Body mass index (BMI) is a measurement that can be used to identify possible weight problems. It estimates body fat based on height and weight. Your health care provider can help determine your BMI and help you achieve or maintain a healthy weight. Get regular exercise Get regular exercise. This is one of the most important things you can do for your health. Most adults should: Exercise for at least 150 minutes each week. The exercise should increase your heart rate and make you sweat (moderate-intensity exercise). Do strengthening exercises at least twice a week. This is in addition to the moderate-intensity exercise. Spend less time sitting. Even light physical activity can be beneficial. Watch cholesterol and blood lipids Have your blood tested for lipids and cholesterol at 76 years of age, then have this test every 5 years. You may need to have your cholesterol levels checked more often if: Your lipid or cholesterol levels are high. You are older than 76 years of age. You are at high risk for heart disease. What should I know about cancer screening? Many types of cancers can be detected early and may often be prevented. Depending on your health history and family history, you may need to have cancer screening at various ages. This may include screening for: Colorectal cancer. Prostate cancer. Skin cancer. Lung  cancer. What should I know about heart disease, diabetes, and high blood pressure? Blood pressure and heart disease High blood pressure causes heart disease and increases the risk of stroke. This is more likely to develop in people who have high blood pressure readings or are overweight. Talk with your health care provider about your target blood pressure readings. Have your blood pressure checked: Every 3-5 years if you are 18-39 years of age. Every year if you are 40 years old or older. If you are between the ages of 65 and 75 and are a current or former smoker, ask your health care provider if you should have a one-time screening for abdominal aortic aneurysm (AAA). Diabetes Have regular diabetes screenings. This checks your fasting blood sugar level. Have the screening done: Once every three years after age 45 if you are at a normal weight and have a low risk for diabetes. More often and at a younger age if you are overweight or have a high risk for diabetes. What should I know about preventing infection? Hepatitis B If you have a higher risk for hepatitis B, you should be screened for this virus. Talk with your health care provider to find out if you are at risk for hepatitis B infection. Hepatitis C Blood testing is recommended for: Everyone born from 1945 through 1965. Anyone with known risk factors for hepatitis C. Sexually transmitted infections (STIs) You should be screened each year for STIs, including gonorrhea and chlamydia, if: You are sexually active and are younger than 76 years of age. You are older than 76 years of age and your   health care provider tells you that you are at risk for this type of infection. Your sexual activity has changed since you were last screened, and you are at increased risk for chlamydia or gonorrhea. Ask your health care provider if you are at risk. Ask your health care provider about whether you are at high risk for HIV. Your health care provider  may recommend a prescription medicine to help prevent HIV infection. If you choose to take medicine to prevent HIV, you should first get tested for HIV. You should then be tested every 3 months for as long as you are taking the medicine. Follow these instructions at home: Alcohol use Do not drink alcohol if your health care provider tells you not to drink. If you drink alcohol: Limit how much you have to 0-2 drinks a day. Know how much alcohol is in your drink. In the [place] one drink equals one 12 oz bottle of beer (355 mL), one 5 oz glass of wine (148 mL), or one 1 oz glass of hard liquor (44 mL). Lifestyle Do not use any products that contain nicotine or tobacco. These products include cigarettes, chewing tobacco, and vaping devices, such as e-cigarettes. If you need help quitting, ask your health care provider. Do not use street drugs. Do not share needles. Ask your health care provider for help if you need support or information about quitting drugs. General instructions Schedule regular health, dental, and eye exams. Stay current with your vaccines. Tell your health care provider if: You often feel depressed. You have ever been abused or do not feel safe at home. Summary Adopting a healthy lifestyle and getting preventive care are important in promoting health and wellness. Follow your health care provider's instructions about healthy diet, exercising, and getting tested or screened for diseases. Follow your health care provider's instructions on monitoring your cholesterol and blood pressure. This information is not intended to replace advice given to you by your health care provider. Make sure you discuss any questions you have with your health care provider. Document Revised: 09/20/2020 Document Reviewed: 09/20/2020 Elsevier Patient Education  2023 [employer]  

## 2022-01-30 MED ORDER — SHINGRIX 50 MCG/0.5ML IM SUSR: 0.5000 mL | Freq: Once | INTRAMUSCULAR | 1 refills | Status: AC

## 2022-01-30 MED ORDER — BOOSTRIX 5-2.5-18.5 LF-MCG/0.5 IM SUSP: 0.5000 mL | Freq: Once | INTRAMUSCULAR | 0 refills | Status: AC

## 2022-02-02 DIAGNOSIS — N2581 Secondary hyperparathyroidism of renal origin: Secondary | ICD-10-CM | POA: Diagnosis not present

## 2022-02-02 DIAGNOSIS — E1122 Type 2 diabetes mellitus with diabetic chronic kidney disease: Secondary | ICD-10-CM | POA: Diagnosis not present

## 2022-02-02 DIAGNOSIS — I120 Hypertensive chronic kidney disease with stage 5 chronic kidney disease or end stage renal disease: Secondary | ICD-10-CM | POA: Diagnosis not present

## 2022-02-02 DIAGNOSIS — C649 Malignant neoplasm of unspecified kidney, except renal pelvis: Secondary | ICD-10-CM | POA: Diagnosis not present

## 2022-02-02 DIAGNOSIS — N184 Chronic kidney disease, stage 4 (severe): Secondary | ICD-10-CM | POA: Diagnosis not present

## 2022-02-02 DIAGNOSIS — D631 Anemia in chronic kidney disease: Secondary | ICD-10-CM | POA: Diagnosis not present

## 2022-02-02 DIAGNOSIS — N185 Chronic kidney disease, stage 5: Secondary | ICD-10-CM | POA: Diagnosis not present

## 2022-02-02 DIAGNOSIS — N189 Chronic kidney disease, unspecified: Secondary | ICD-10-CM | POA: Diagnosis not present

## 2022-02-05 NOTE — Progress Notes (Signed)
Office Visit Note   Patient: Antonio Adams           Date of Birth: 09/07/1945           MRN: [id number] Visit Date: 12/13/2018              Requested by: [name], MD [id number] [place],  [place] [address] PCP: [name], MD   Assessment & Plan: Visit Diagnoses:  1. Right low back pain, unspecified chronicity, unspecified whether sciatica present     Plan: Follow-up as needed.  Follow-Up Instructions: No follow-ups on file.   Orders:  Orders Placed This Encounter  Procedures   XR Lumbar Spine 2-3 Views   No orders of the defined types were placed in this encounter.     Procedures: No procedures performed   Clinical Data: No additional findings.   Subjective: Chief Complaint  Patient presents with   Lower Back - Pain    HPI  Review of Systems   Objective: Vital Signs: There were no vitals taken for this visit.  Physical Exam  Ortho Exam  Specialty Comments:  No specialty comments available.  Imaging: No results found.   PMFS History: Patient Active Problem List   Diagnosis Date Noted   Encounter for general adult medical examination with abnormal findings 01/23/2022   Anemia due to zinc deficiency 12/20/2021   Low back pain 12/15/2021   Symptomatic anemia 12/15/2021   Cancer related pain 12/15/2021   Chronic heart failure with preserved ejection fraction (HFpEF) ([place]) 08/12/2021   Tinea cruris 08/12/2021   Anemia due to chronic kidney disease 07/20/2021   CKD (chronic kidney disease) stage 5, GFR less than 15 ml/min (HCC) 05/24/2021   BPH (benign prostatic hyperplasia) 05/24/2021   Diabetic renal disease ([place]) 05/20/2021   Dry eye syndrome of bilateral lacrimal glands 05/20/2021   Senile nuclear sclerosis 05/20/2021   Vitamin D deficiency 05/20/2021   Vitreous degeneration, unspecified eye 05/20/2021   Intrinsic eczema 04/18/2021   Atherosclerosis of aorta ([place]) 12/30/2020   Mixed conductive and sensorineural  hearing loss of left ear with restricted hearing of right ear 09/01/2020   Tinnitus of both ears 05/20/2020   Metastatic renal cell carcinoma ([place]) 02/26/2020   Diabetic polyneuropathy associated with type 2 diabetes mellitus ([employer]) 12/18/2019   Murmur, cardiac 10/08/2018   DDD (degenerative disc disease), cervical 10/08/2018   OAB (overactive bladder) 04/04/2018   Seasonal allergic rhinitis due to pollen 11/01/2016   Microalbuminuria due to type 2 diabetes mellitus ([place]) 07/27/2016   Varicose veins of both lower extremities 08/06/2015   Varicose veins of lower extremities with ulcer ([place]) 06/16/2015   Internal hemorrhoid, bleeding 08/03/2014   Post traumatic stress disorder (PTSD) 01/29/2014   Spinal stenosis of lumbar region with radiculopathy 10/30/2011    Class: Chronic   Obstructive sleep apnea 11/24/2010   Obesity 11/24/2010   Routine general medical examination at a health care facility 11/24/2010   ADENOCARCINOMA, PROSTATE, GLEASON GRADE 6 12/21/2009   Type 2 diabetes mellitus with diabetic cataract ([place]) 03/16/2007   Essential hypertension 03/16/2007   GERD 03/16/2007   Past Medical History:  Diagnosis Date   ADENOCARCINOMA, PROSTATE, GLEASON GRADE 6 12/21/2009   prostate cancer   ALLERGIC RHINITIS 03/16/2007   Allergy    Anxiety    Arthritis    back    BACK PAIN WITH RADICULOPATHY 11/23/2008   Cancer of kidney ([place])    partial right kidney removed   CARPAL TUNNEL  SYNDROME, BILATERAL 03/16/2007   Chronic back pain    Constipation    takes Senokot daily   Depression    occasionally   DIABETES MELLITUS, TYPE II 03/16/2007   takes Januvia and MEtformin daily   GASTROINTESTINAL HEMORRHAGE, HX OF 03/16/2007   GERD 03/16/2007   takes Omeprazole daily   Glaucoma    mild - no eye drops   Hemorrhoids    History of colon polyps    HYPERLIPIDEMIA 03/16/2007   takes Crestor daily   HYPERTENSION 03/16/2007   takes Diltiazem and Lisinopril daily    Kidney cancer, primary, with  metastasis from kidney to other site [facility])    LEG CRAMPS 05/22/2007   Nocturia    Overactive bladder    PARESTHESIA 05/21/2007   Proctitis    PTSD (post-traumatic stress disorder)    wakes up may be dreaming of fighting   Rectal bleeding    Dr.Norins has explained its from the Radiation that he has received   Rectal bleeding    Renal cell carcinoma ([place])    Sleep apnea    uses CPAP nightly   Stomach cancer ([place])    Urinary frequency    takes Toviaz daily    Family History  Problem Relation Age of Onset   Cancer Sister 31       Cancer, unsure type   Hypertension Other    Colon cancer Neg Hx    Esophageal cancer Neg Hx    Stomach cancer Neg Hx    Diabetes Neg Hx    Colon polyps Neg Hx    Rectal cancer Neg Hx     Past Surgical History:  Procedure Laterality Date   ADRENALECTOMY Right 02/26/2020   Procedure: OPEN RIGHT RENAL CELL METASTATECTOMY AND  ADRENALECTOMY;  Surgeon: [name], MD;  Location: [facility];  Service: Urology;  Laterality: Right;   [address], 2008   repeat surgery; ESI '08   [place]     bilateral   CERVICAL FUSION     COLONOSCOPY     colonosocpy     ESOPHAGOGASTRODUODENOSCOPY     ED   FLEXIBLE SIGMOIDOSCOPY N/A 01/08/2017   Procedure: FLEXIBLE SIGMOIDOSCOPY;  Surgeon: Irene Shipper, MD;  Location: [facility];  Service: Endoscopy;  Laterality: N/A;   HOT HEMOSTASIS N/A 01/08/2017   Procedure: HOT HEMOSTASIS (ARGON PLASMA COAGULATION/BICAP);  Surgeon: Irene Shipper, MD;  Location: [facility];  Service: Endoscopy;  Laterality: N/A;   IR RADIOLOGIST EVAL & MGMT  12/17/2019   IR RADIOLOGIST EVAL & MGMT  07/01/2020   IR RADIOLOGIST EVAL & MGMT  08/25/2020   LUMBAR LAMINECTOMY  10/30/2011   Procedure: MICRODISCECTOMY LUMBAR LAMINECTOMY;  Surgeon: Jessy Oto, MD;  Location: [place];  Service: Orthopedics;  Laterality: Right;  Right L4-5 and L5-S1 Microdiscectomy   LUMBAR LAMINECTOMY  06/17/2012   Procedure: MICRODISCECTOMY LUMBAR  LAMINECTOMY;  Surgeon: Jessy Oto, MD;  Location: [facility];  Service: Orthopedics;  Laterality: N/A;  Right L5-S1 microdiscectomy   OPEN PARTIAL HEPATECTOMY  N/A 02/26/2020   Procedure: OPEN PARTIAL HEPATECTOMY, CHOLECYSTECTOMY;  Surgeon: Stark Klein, MD;  Location: [facility];  Service: General;  Laterality: N/A;   POLYPECTOMY     PROSTATE SURGERY  march 2012   seed implant   RADIOFREQUENCY ABLATION N/A 07/21/2020   Procedure: CT MICROWAVE ABLATION;  Surgeon: Criselda Peaches, MD;  Location: [facility];  Service: Anesthesiology;  Laterality: N/A;   ROBOTIC ASSITED PARTIAL NEPHRECTOMY Right 09/08/2015  Procedure: XI ROBOTIC ASSITED RIGHT PARTIAL NEPHRECTOMY;  Surgeon: [name], MD;  [facility];  Service: Urology;  Laterality: Right;  Clamp on: 1033 Clamp off: 1103 Total Clamp Time: 30 minutes   SHOULDER ARTHROSCOPY WITH SUBACROMIAL DECOMPRESSION Left 01/20/2016   Procedure: LEFT SHOULDER ARTHROSCOPY WITH EXTENSIVE  DEBRIDEMENT, ACROMIOPLASTY;  Surgeon: Ninetta Lights, MD;  Location: [facility];  Service: Orthopedics;  Laterality: Left;   SHOULDER SURGERY  LFT   Stress Cardiolite  12/24/2003   negative for ischemia   UPPER ESOPHAGEAL ENDOSCOPIC ULTRASOUND (EUS)  04/18/2016   [facility]   UPPER GASTROINTESTINAL ENDOSCOPY     WOUND EXPLORATION     management of a wound that he sustained in the [place] History   Occupation: Event organiser; Banker: RETIRED    Comment: retired from Event organiser  Tobacco Use   Smoking status: Never   Smokeless tobacco: Never  Vaping Use   Vaping Use: Never used  Substance and Sexual Activity   Alcohol use: Not Currently    Alcohol/week: 0.0 standard drinks of alcohol   Drug use: No   Sexual activity: Not Currently    Partners: Female

## 2022-02-13 ENCOUNTER — Encounter (HOSPITAL_COMMUNITY)
Admission: RE | Admit: 2022-02-13 | Discharge: 2022-02-13 | Disposition: A | Discharge status: Home / Self Care | Payer: Medicare Other | Source: Ambulatory Visit | Attending: Nephrology | Admitting: Nephrology

## 2022-02-13 DIAGNOSIS — N185 Chronic kidney disease, stage 5: Secondary | ICD-10-CM | POA: Insufficient documentation

## 2022-02-13 DIAGNOSIS — C787 Secondary malignant neoplasm of liver and intrahepatic bile duct: Secondary | ICD-10-CM | POA: Diagnosis not present

## 2022-02-13 DIAGNOSIS — D49511 Neoplasm of unspecified behavior of right kidney: Secondary | ICD-10-CM | POA: Diagnosis not present

## 2022-02-13 LAB — POCT HEMOGLOBIN-HEMACUE: Hemoglobin: 7 g/dL — ABNORMAL LOW (ref 13.0–17.0)

## 2022-02-13 MED ORDER — EPOETIN ALFA-EPBX 40000 UNIT/ML IJ SOLN
30000.0000 [IU] | INTRAMUSCULAR | Status: DC
  Administered 2022-02-13: 30000 [IU] via SUBCUTANEOUS

## 2022-02-13 MED ORDER — EPOETIN ALFA-EPBX 40000 UNIT/ML IJ SOLN
INTRAMUSCULAR | Status: AC
  Filled 2022-02-13: qty 1

## 2022-02-13 NOTE — Progress Notes (Signed)
Pt here for first retacrit injection.  HGB 6.9 via hemocue, repeat showed 7.0.  Pt states that he has seen no active bleeding, denies chest pain and shortness of breath, but has noticied dark stools. Dr Bishop [place] office called.  Pt had been 7.2 in office and MD aware.  Instructed to continue with plan as ordered.  Pt instructed to watch for bleeding, chest pain or increased shortness of breath and to notify the office for any concerns

## 2022-02-21 DIAGNOSIS — N185 Chronic kidney disease, stage 5: Secondary | ICD-10-CM | POA: Diagnosis not present

## 2022-03-01 ENCOUNTER — Ambulatory Visit (INDEPENDENT_AMBULATORY_CARE_PROVIDER_SITE_OTHER): Payer: Medicare Other | Admitting: Cardiology

## 2022-03-01 ENCOUNTER — Encounter (HOSPITAL_BASED_OUTPATIENT_CLINIC_OR_DEPARTMENT_OTHER): Payer: Self-pay | Admitting: Cardiology

## 2022-03-01 VITALS — BP 148/88 | HR 62 | Ht 71.0 in | Wt 212.6 lb

## 2022-03-01 DIAGNOSIS — I10 Essential (primary) hypertension: Secondary | ICD-10-CM

## 2022-03-01 DIAGNOSIS — I5032 Chronic diastolic (congestive) heart failure: Secondary | ICD-10-CM | POA: Diagnosis not present

## 2022-03-01 DIAGNOSIS — N185 Chronic kidney disease, stage 5: Secondary | ICD-10-CM | POA: Diagnosis not present

## 2022-03-01 DIAGNOSIS — I2510 Atherosclerotic heart disease of native coronary artery without angina pectoris: Secondary | ICD-10-CM

## 2022-03-01 NOTE — Patient Instructions (Signed)
Medication Instructions:  Your Physician recommend you continue on your current medication as directed.    *If you need a refill on your cardiac medications before your next appointment, please call your pharmacy*   Lab Work: None ordered today   Testing/Procedures: None ordered today   Follow-Up: At [facility], you and your health needs are our priority.  As part of our continuing mission to provide you with exceptional heart care, we have created designated Provider Care Teams.  These Care Teams include your primary Cardiologist (physician) and Advanced Practice Providers (APPs -  Physician Assistants and Nurse Practitioners) who all work together to provide you with the care you need, when you need it.  We recommend signing up for the patient portal called "MyChart".  Sign up information is provided on this After Visit Summary.  MyChart is used to connect with patients for Virtual Visits (Telemedicine).  Patients are able to view lab/test results, encounter notes, upcoming appointments, etc.  Non-urgent messages can be sent to your provider as well.   To learn more about what you can do with MyChart, go to [email or web address].    Your next appointment:   6 month(s)  The format for your next appointment:   In Person  Provider:   Buford Dresser, MD     Important Information About Sugar   how to check blood pressure:  -sit comfortably in a chair, feet uncrossed and flat on floor, for 5-10 minutes  -arm ideally should rest at the level of the heart. However, arm should be relaxed and not tense (for example, do not hold the arm up unsupported)  -avoid exercise, caffeine, and tobacco for at least 30 minutes prior to BP reading  -don't take BP cuff reading over clothes (always place on skin directly)  -I prefer to know how well the medication is working, so I would like you to take your readings 1-2 hours after taking your blood pressure medication if  possible

## 2022-03-01 NOTE — Progress Notes (Signed)
Cardiology Office Note:    Date:  03/01/2022   ID:  Antonio Adams, DOB 1945-10-21, MRN [id number]  PCP:  [name], MD  Cardiologist:  Buford Dresser, MD  Referring MD: [name], MD   Cc: follow up  History of Present Illness:    Antonio Adams is a 76 y.o. male with a hx of hypertension, and hyperlipidemia, who is seen for follow-up.  CV history: He was admitted to the hospital 01/07/2022. He reported that for the last two weeks he developed increasing exertional dyspnea. He was found to be profoundly anemic. His symptoms resolved with transfusion, his anemia appeared to be acute on chronic secondary to advanced chronic kidney disease. Troponins were elevated in the setting of CKD5 and stable. Echo performed was unremarkable with an EF of 60 to 65% and grade 1 diastolic dysfunction. He was discharged and recommended PCP and nephrology follow up.  Today:  He says he has been feeling okay. He has an appointment with his nephrologist in the coming month.   He states that his bilateral LE edema seems to have improved, but it has not completely resolved still. The swelling tends to be better in the mornings after he wakes up.  Additionally, he mentions that he has been experiencing some nausea and vomiting lately.   His at home blood pressure is usually around 812-751 systolic. In clinic today upon recheck, his blood pressure was 148/88.  He denies any palpitations, chest pain, or shortness of breath. No lightheadedness, headaches, syncope, orthopnea, or PND.  Past Medical History:  Diagnosis Date   ADENOCARCINOMA, PROSTATE, GLEASON GRADE 6 12/21/2009   prostate cancer   ALLERGIC RHINITIS 03/16/2007   Allergy    Anxiety    Arthritis    back    BACK PAIN WITH RADICULOPATHY 11/23/2008   Cancer of kidney (DISH)    partial right kidney removed   CARPAL TUNNEL SYNDROME, BILATERAL 03/16/2007   Chronic back pain    Constipation    takes Senokot daily    Depression    occasionally   DIABETES MELLITUS, TYPE II 03/16/2007   takes Januvia and MEtformin daily   GASTROINTESTINAL HEMORRHAGE, HX OF 03/16/2007   GERD 03/16/2007   takes Omeprazole daily   Glaucoma    mild - no eye drops   Hemorrhoids    History of colon polyps    HYPERLIPIDEMIA 03/16/2007   takes Crestor daily   HYPERTENSION 03/16/2007   takes Diltiazem and Lisinopril daily    Kidney cancer, primary, with metastasis from kidney to other site [facility])    LEG CRAMPS 05/22/2007   Nocturia    Overactive bladder    PARESTHESIA 05/21/2007   Proctitis    PTSD (post-traumatic stress disorder)    wakes up may be dreaming of fighting   Rectal bleeding    Dr.Norins has explained its from the Radiation that he has received   Rectal bleeding    Renal cell carcinoma ([place])    Sleep apnea    uses CPAP nightly   Stomach cancer ([place])    Urinary frequency    takes Toviaz daily    Past Surgical History:  Procedure Laterality Date   ADRENALECTOMY Right 02/26/2020   Procedure: OPEN RIGHT RENAL CELL METASTATECTOMY AND  ADRENALECTOMY;  Surgeon: [name], MD;  Location: [facility];  Service: Urology;  Laterality: Right;   [place], 2008   repeat surgery; ESI '08   [place]  bilateral   CERVICAL FUSION     COLONOSCOPY     colonosocpy     ESOPHAGOGASTRODUODENOSCOPY     ED   FLEXIBLE SIGMOIDOSCOPY N/A 01/08/2017   Procedure: FLEXIBLE SIGMOIDOSCOPY;  Surgeon: Irene Shipper, MD;  Location: [facility] ENDOSCOPY;  Service: Endoscopy;  Laterality: N/A;   HOT HEMOSTASIS N/A 01/08/2017   Procedure: HOT HEMOSTASIS (ARGON PLASMA COAGULATION/BICAP);  Surgeon: Irene Shipper, MD;  Location: [facility] ENDOSCOPY;  Service: Endoscopy;  Laterality: N/A;   IR RADIOLOGIST EVAL & MGMT  12/17/2019   IR RADIOLOGIST EVAL & MGMT  07/01/2020   IR RADIOLOGIST EVAL & MGMT  08/25/2020   LUMBAR LAMINECTOMY  10/30/2011   Procedure: MICRODISCECTOMY LUMBAR LAMINECTOMY;  Surgeon: Jessy Oto, MD;  Location: [place];  Service: Orthopedics;  Laterality: Right;  Right L4-5 and L5-S1 Microdiscectomy   LUMBAR LAMINECTOMY  06/17/2012   Procedure: MICRODISCECTOMY LUMBAR LAMINECTOMY;  Surgeon: Jessy Oto, MD;  Location: [place];  Service: Orthopedics;  Laterality: N/A;  Right L5-S1 microdiscectomy   OPEN PARTIAL HEPATECTOMY  N/A 02/26/2020   Procedure: OPEN PARTIAL HEPATECTOMY, CHOLECYSTECTOMY;  Surgeon: Stark Klein, MD;  Location: [facility];  Service: General;  Laterality: N/A;   POLYPECTOMY     PROSTATE SURGERY  march 2012   seed implant   RADIOFREQUENCY ABLATION N/A 07/21/2020   Procedure: CT MICROWAVE ABLATION;  Surgeon: Criselda Peaches, MD;  Location: [facility];  Service: Anesthesiology;  Laterality: N/A;   ROBOTIC ASSITED PARTIAL NEPHRECTOMY Right 09/08/2015   Procedure: XI ROBOTIC ASSITED RIGHT PARTIAL NEPHRECTOMY;  Surgeon: [name], MD;  Location: [facility];  Service: Urology;  Laterality: Right;  Clamp on: 1033 Clamp off: 1103 Total Clamp Time: 30 minutes   SHOULDER ARTHROSCOPY WITH SUBACROMIAL DECOMPRESSION Left 01/20/2016   Procedure: LEFT SHOULDER ARTHROSCOPY WITH EXTENSIVE  DEBRIDEMENT, ACROMIOPLASTY;  Surgeon: Ninetta Lights, MD;  Location: [facility];  Service: Orthopedics;  Laterality: Left;   SHOULDER SURGERY  LFT   Stress Cardiolite  12/24/2003   negative for ischemia   UPPER ESOPHAGEAL ENDOSCOPIC ULTRASOUND (EUS)  04/18/2016   [facility]   UPPER GASTROINTESTINAL ENDOSCOPY     WOUND EXPLORATION     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   atorvastatin (LIPITOR) 20 MG tablet Take 1 tablet (20 mg total) by mouth at bedtime.   calcitRIOL (ROCALTROL) 0.25 MCG capsule Take 0.25 mcg by mouth daily.   Cyanocobalamin (B-12 PO) Take 1 tablet by mouth daily.   ferrous sulfate 325 (65 FE) MG  tablet Take 1 tablet (325 mg total) by mouth 2 (two) times daily. Patient needs office visit for further refills (Patient taking differently: Take 325 mg by mouth 2 (two) times daily.)   furosemide (LASIX) 40 MG tablet TAKE 1 TABLET BY MOUTH EVERY DAY (Patient taking differently: Take 40 mg by mouth daily.)   Iron-Vitamins (GERITOL) LIQD Take 10 mLs by mouth daily.   LOKELMA 10 g PACK packet Take 10 g by mouth daily.   megestrol (MEGACE) 40 MG/ML suspension TAKE 10 MLS (400 MG TOTAL) BY MOUTH DAILY.   metoprolol tartrate (LOPRESSOR) 25 MG tablet Take 1 tablet (25 mg total) by mouth 2 (two) times daily.   Omega-3 Fatty Acids (FISH OIL PO) Take 1 tablet by mouth daily.   OVER THE  COUNTER MEDICATION Take 1 tablet by mouth daily. Focus factor   oxyCODONE ER (XTAMPZA ER) 9 MG C12A Take 1 tablet by mouth 2 (two) times daily as needed.   pantoprazole (PROTONIX) 20 MG tablet Take 1 tablet (20 mg total) by mouth daily.   polyvinyl alcohol (LIQUIFILM TEARS) 1.4 % ophthalmic solution Place 1 drop into both eyes daily as needed for dry eyes.   solifenacin (VESICARE) 5 MG tablet TAKE 1 TABLET (5 MG TOTAL) BY MOUTH DAILY.   tamsulosin (FLOMAX) 0.4 MG CAPS capsule TAKE 1 CAPSULE (0.4 MG TOTAL) BY MOUTH DAILY. (Patient taking differently: Take 0.4 mg by mouth daily.)   zinc gluconate 50 MG tablet Take 1 tablet (50 mg total) by mouth daily.   No current facility-administered medications on file prior to visit.     Allergies:   Ace inhibitors, Xyzal [levocetirizine], and Opdivo [nivolumab]   Social History   Tobacco Use   Smoking status: Never   Smokeless tobacco: Never  Vaping Use   Vaping Use: Never used  Substance Use Topics   Alcohol use: Not Currently    Alcohol/week: 0.0 standard drinks of alcohol   Drug use: No    Family History: family history includes Cancer (age of onset: 8) in his sister; Hypertension in an other family member. There is no history of Colon cancer, Esophageal cancer,  Stomach cancer, Diabetes, Colon polyps, or Rectal cancer.  ROS:   Please see the history of present illness.   (+) Bilateral LE edema  (+) Nausea (+) Vomiting  Additional pertinent ROS otherwise negative.   EKGs/Labs/Other Studies Reviewed:    The following studies were reviewed today:  Echo 01/07/2022: 1. Left ventricular ejection fraction, by estimation, is 60 to 65%. Left  ventricular ejection fraction by PLAX is 65 %. The left ventricle has  normal function. The left ventricle has no regional wall motion  abnormalities. Left ventricular diastolic  parameters are consistent with Grade I diastolic dysfunction (impaired  relaxation).   2. Right ventricular systolic function is low normal. The right  ventricular size is normal. Tricuspid regurgitation signal is inadequate  for assessing PA pressure.   3. The mitral valve is abnormal. Trivial mitral valve regurgitation.   4. The aortic valve is tricuspid. Aortic valve regurgitation is not  visualized. Mild aortic valve stenosis. Aortic valve area, by VTI measures  1.81 cm. Aortic valve mean gradient measures 8.0 mmHg. Aortic valve Vmax  measures 1.86 m/s. DI is 0.48, Peak  gradient 14 mmHg, based on LVOT diameter of 2.2 cm.   5. The inferior vena cava is normal in size with greater than 50%  respiratory variability, suggesting right atrial pressure of 3 mmHg.   Comparison(s): Changes from prior study are noted. 07/21/2021: LVEF 48%, global hypokinesis.    CT Chest 10/04/2021: IMPRESSION: 1. Enlarging retroperitoneal lymph node below the left renal vein, currently 2.6 cm in short axis, compatible with malignancy. 2. Stable nonspecific right adrenal mass. Stable extensive scarring and irregularity in the right kidney upper pole and adjacent portion of the right kidney, much of this is likely therapy related although residual tumor cannot be excluded at this location on today's noncontrast exam. 3. Enlarging L3 inferior endplate  lucent lesion, morphology favoring Schmorl's node, lytic metastatic lesion less likely. 4. Other imaging findings of potential clinical significance: Aortic Atherosclerosis (ICD10-I70.0). Coronary atherosclerosis. Prominent main pulmonary artery, possibly from pulmonary arterial hypertension. Lumbar spondylosis and degenerative disc disease causing left-sided impingement at L4-5 and L5-S1.   Echo  07/21/2021:  1. Left ventricular ejection fraction, by estimation, is 50%. Left  ventricular ejection fraction by 3D volume is 48 %. The left ventricle has  mildly decreased function. The left ventricle demonstrates global  hypokinesis. Left ventricular diastolic  parameters are consistent with Grade I diastolic dysfunction (impaired  relaxation).   2. Right ventricular systolic function is normal. The right ventricular  size is normal.   3. The mitral valve is normal in structure. No evidence of mitral valve  regurgitation. No evidence of mitral stenosis.   4. The aortic valve is tricuspid. Aortic valve regurgitation is not  visualized. Aortic valve sclerosis is present, with no evidence of aortic  valve stenosis.   5. The inferior vena cava is normal in size with greater than 50%  respiratory variability, suggesting right atrial pressure of 3 mmHg.   Comparison(s): No prior Echocardiogram.   CT Chest 05/06/2021 Cardiovascular: Heart is normal in size.  No pericardial effusion.   No evidence of thoracic aortic aneurysm. Mild atherosclerotic calcifications of the aortic arch.   Three vessel coronary atherosclerosis.    Atherosclerotic calcifications of the abdominal aorta and branch vessels.   IMPRESSION: Status post partial right nephrectomy, right adrenalectomy, and microwave ablation of the posterior right hepatic lobe metastasis.   2.8 cm lesion in the posterior right hepatic lobe, improved.   Nodularity in the right adrenalectomy bed and hepatorenal fossa, grossly unchanged.    Brachytherapy seeds in the prostate.   No evidence of metastatic disease in the chest.  Monitor 11/2018: Sinus rhythm Frequent episodes of daytime tachycardia are noted and likely represent sinus tachycardia No sustained arrhythmias   EKG:  EKG is personally reviewed.   03/01/22: not ordered today 08/17/2021: not ordered today  Recent Labs: 07/20/2021: Pro B Natriuretic peptide (BNP) 119.0 07/22/2021: Magnesium 1.8 10/04/2021: TSH 2.577 11/18/2021: ALT 17 01/07/2022: B Natriuretic Peptide 207.6 01/08/2022: BUN 70; Creatinine, Ser 7.07; Potassium 4.4; Sodium 140 01/23/2022: Platelets 176.0 02/13/2022: Hemoglobin 7.0   Recent Lipid Panel    Component Value Date/Time   CHOL 136 07/22/2021 0330   TRIG 46 07/22/2021 0330   HDL 34 (L) 07/22/2021 0330   CHOLHDL 4.0 07/22/2021 0330   VLDL 9 07/22/2021 0330   LDLCALC 93 07/22/2021 0330    Physical Exam:    VS:  BP (!) 148/88 (BP Location: Left Arm, Patient Position: Sitting, Cuff Size: Normal)   Pulse 62   Ht '5\' 11"'$  (1.803 m)   Wt 212 lb 9.6 oz (96.4 kg)   SpO2 100%   BMI 29.65 kg/m     Wt Readings from Last 3 Encounters:  03/01/22 212 lb 9.6 oz (96.4 kg)  01/23/22 209 lb (94.8 kg)  01/06/22 202 lb (91.6 kg)    GEN: Well nourished, well developed in no acute distress HEENT: Normal, moist mucous membranes NECK: No JVD CARDIAC: regular rhythm, normal S1 and S2, no rubs or gallops. 2/6 systolic murmur. VASCULAR: Radial and DP pulses 2+ bilaterally. No carotid bruits RESPIRATORY:  Clear bilaterally without rales, wheezing or rhonchi  ABDOMEN: Soft, non-tender, non-distended MUSCULOSKELETAL:  Ambulates independently SKIN: Warm and dry, bilateral trace LE edema NEUROLOGIC:  Alert and oriented x 3. No focal neuro deficits noted. PSYCHIATRIC:  Normal affect    ASSESSMENT:    1. Essential hypertension   2. Chronic heart failure with preserved ejection fraction (HFpEF) ([place])   3. CKD (chronic kidney disease) stage 5, GFR less than  15 ml/min (HCC)   4. Coronary artery calcification seen on  CT scan     PLAN:    Low normal LVEF LE edema Coronary calcification on CT Hypertension Chronic kidney disease, stage 5 (last GFR 14) -appears largely euvolemic except for LE edema -EF 50%, low normal. He was started on metoprolol BID. We changed to carvedilol, but he is back on metoprolol (unclear why it was changed back) -given CKD 5, no room for ACEi/ARB/ARNI/MRA. -consider initiation of SGLT2i now that GFR limits have been changed, defer to nephrology -Could consider aspirin in the future based on the trajectory of his anemia; need to be cautious given nadir of 6.8 -We again discussed his preferences, as we did in the hospital. He again confirmed that he would not want dialysis under any circumstances. Given this, we will continue to proceed with the plan for no ischemic workup. He does not have any ischemic symptoms. Even if he had an abnormal nuclear stress test, he would not want cath, therefore will not pursue -does not check BP at home. Discussed this. Given LE edema, would like to avoid vasodilators, but if BP remains elevated may not have a choice -he has follow up pending with his nephrologist -continue atorvastatin  Cardiac risk counseling and prevention recommendations: -recommend heart healthy/Mediterranean diet, with whole grains, fruits, vegetable, fish, lean meats, nuts, and olive oil. Limit salt. -recommend moderate walking, 3-5 times/week for 30-50 minutes each session. Aim for at least 150 minutes.week. Goal should be pace of 3 miles/hours, or walking 1.5 miles in 30 minutes -recommend avoidance of tobacco products. Avoid excess alcohol. -ASCVD risk score: The 10-year ASCVD risk score (Arnett DK, et al., 2019) is: 45%   Values used to calculate the score:     Age: 40 years     Sex: Male     Is Non-Hispanic African American: Yes     Diabetic: Yes     Tobacco smoker: No     Systolic Blood Pressure: 431  mmHg     Is BP treated: Yes     HDL Cholesterol: 34 mg/dL     Total Cholesterol: 136 mg/dL    Plan for follow up: 6 months.  Buford Dresser, MD, PhD, [facility]    Medication Adjustments/Labs and Tests Ordered: Current medicines are reviewed at length with the patient today.  Concerns regarding medicines are outlined above.   No orders of the defined types were placed in this encounter.  No orders of the defined types were placed in this encounter.  Patient Instructions  Medication Instructions:  Your Physician recommend you continue on your current medication as directed.    *If you need a refill on your cardiac medications before your next appointment, please call your pharmacy*   Lab Work: None ordered today   Testing/Procedures: None ordered today   Follow-Up: At [facility], you and your health needs are our priority.  As part of our continuing mission to provide you with exceptional heart care, we have created designated Provider Care Teams.  These Care Teams include your primary Cardiologist (physician) and Advanced Practice Providers (APPs -  Physician Assistants and Nurse Practitioners) who all work together to provide you with the care you need, when you need it.  We recommend signing up for the patient portal called "MyChart".  Sign up information is provided on this After Visit Summary.  MyChart is used to connect with patients for Virtual Visits (Telemedicine).  Patients are able to view lab/test results, encounter notes, upcoming appointments, etc.  Non-urgent messages  can be sent to your provider as well.   To learn more about what you can do with MyChart, go to [email or web address].    Your next appointment:   6 month(s)  The format for your next appointment:   In Person  Provider:   Buford Dresser, MD     Important Information About Sugar   how to check blood pressure:  -sit comfortably in a chair,  feet uncrossed and flat on floor, for 5-10 minutes  -arm ideally should rest at the level of the heart. However, arm should be relaxed and not tense (for example, do not hold the arm up unsupported)  -avoid exercise, caffeine, and tobacco for at least 30 minutes prior to BP reading  -don't take BP cuff reading over clothes (always place on skin directly)  -I prefer to know how well the medication is working, so I would like you to take your readings 1-2 hours after taking your blood pressure medication if possible    I,Breanna Adamick,acting as a scribe for Buford Dresser, MD.,have documented all relevant documentation on the behalf of Buford Dresser, MD,as directed by  Buford Dresser, MD while in the presence of Buford Dresser, MD.   I, Buford Dresser, MD, have reviewed all documentation for this visit. The documentation on 03/01/22 for the exam, diagnosis, procedures, and orders are all accurate and complete.   Signed, Buford Dresser, MD PhD 03/01/2022     [place]

## 2022-03-13 ENCOUNTER — Encounter (HOSPITAL_COMMUNITY)
Admission: RE | Admit: 2022-03-13 | Discharge: 2022-03-13 | Disposition: A | Discharge status: Home / Self Care | Payer: Medicare Other | Source: Ambulatory Visit | Attending: Nephrology | Admitting: Nephrology

## 2022-03-13 VITALS — BP 158/90 | HR 73 | Temp 97.4°F | Resp 18

## 2022-03-13 DIAGNOSIS — N185 Chronic kidney disease, stage 5: Secondary | ICD-10-CM | POA: Diagnosis not present

## 2022-03-13 LAB — POCT HEMOGLOBIN-HEMACUE: Hemoglobin: 7.1 g/dL — ABNORMAL LOW (ref 13.0–17.0)

## 2022-03-13 MED ORDER — EPOETIN ALFA-EPBX 40000 UNIT/ML IJ SOLN
INTRAMUSCULAR | Status: AC
  Filled 2022-03-13: qty 1

## 2022-03-13 MED ORDER — EPOETIN ALFA-EPBX 40000 UNIT/ML IJ SOLN
30000.0000 [IU] | INTRAMUSCULAR | Status: DC
  Administered 2022-03-13: 30000 [IU] via SUBCUTANEOUS

## 2022-03-13 NOTE — Progress Notes (Signed)
Patient here for second retacrit injection. Hemoglobin 7.1 today, patient denied seeing blood, but was little SOB earlier in week. Amber at [facility] has been notified and will check with Dr. Hollie Salk.  Amber stated that patient should receive same dose as ordered and Dr. Hollie Salk will discuss with patient frequency of visits.

## 2022-03-14 ENCOUNTER — Encounter: Payer: Self-pay | Admitting: Specialist

## 2022-03-14 NOTE — Progress Notes (Signed)
This appointment was cancelled and patient not seen, he left.without being seen.

## 2022-03-19 ENCOUNTER — Emergency Department (HOSPITAL_BASED_OUTPATIENT_CLINIC_OR_DEPARTMENT_OTHER)
Admission: EM | Admit: 2022-03-19 | Discharge: 2022-03-19 | Disposition: A | Discharge status: Home / Self Care | Payer: Medicare Other | Attending: Emergency Medicine | Admitting: Emergency Medicine

## 2022-03-19 ENCOUNTER — Encounter (HOSPITAL_BASED_OUTPATIENT_CLINIC_OR_DEPARTMENT_OTHER): Payer: Self-pay | Admitting: Emergency Medicine

## 2022-03-19 DIAGNOSIS — S46811A Strain of other muscles, fascia and tendons at shoulder and upper arm level, right arm, initial encounter: Secondary | ICD-10-CM | POA: Diagnosis not present

## 2022-03-19 DIAGNOSIS — X58XXXA Exposure to other specified factors, initial encounter: Secondary | ICD-10-CM | POA: Insufficient documentation

## 2022-03-19 DIAGNOSIS — M542 Cervicalgia: Secondary | ICD-10-CM | POA: Insufficient documentation

## 2022-03-19 DIAGNOSIS — R030 Elevated blood-pressure reading, without diagnosis of hypertension: Secondary | ICD-10-CM | POA: Diagnosis not present

## 2022-03-19 DIAGNOSIS — S4991XA Unspecified injury of right shoulder and upper arm, initial encounter: Secondary | ICD-10-CM | POA: Diagnosis present

## 2022-03-19 DIAGNOSIS — I10 Essential (primary) hypertension: Secondary | ICD-10-CM | POA: Diagnosis not present

## 2022-03-19 MED ORDER — CYCLOBENZAPRINE HCL 5 MG PO TABS: 5.0000 mg | ORAL_TABLET | Freq: Two times a day (BID) | ORAL | 0 refills | Status: DC | PRN

## 2022-03-19 MED ORDER — ACETAMINOPHEN 500 MG PO TABS
1000.0000 mg | ORAL_TABLET | Freq: Once | ORAL | Status: AC
  Administered 2022-03-19: 1000 mg via ORAL
  Filled 2022-03-19: qty 2

## 2022-03-19 NOTE — ED Provider Notes (Signed)
[facility] EMERGENCY DEPT Provider Note   CSN: [id number] Arrival date & time: 03/19/22  7564     History  Chief Complaint  Patient presents with   Neck Pain    Antonio Adams is a 76 y.o. male.  Patient presents with right-sided neck pain for 2 days.  Worse with movement and position.  No new injury.  Patient had neck surgery decades ago does not recall details.  Patient denies weakness or numbness in extremities, no chest pain or shortness of breath.  Spasm sensation.  No heavy lifting recently.  Blood pressure elevated this morning.       Home Medications Prior to Admission medications   Medication Sig Start Date End Date Taking? Authorizing Provider  cyclobenzaprine (FLEXERIL) 5 MG tablet Take 1 tablet (5 mg total) by mouth 2 (two) times daily as needed for muscle spasms. 03/19/22  Yes Elnora Morrison, MD  [facility]aequ[facility] EXTRA STRENGTH PO) Take 1 tablet by mouth daily.    [provider]  atorvastatin (LIPITOR) 20 MG tablet Take 1 tablet (20 mg total) by mouth at bedtime. 05/29/21 03/01/22  Kayleen Memos, DO  calcitRIOL (ROCALTROL) 0.25 MCG capsule Take 0.25 mcg by mouth daily. 01/05/22   [provider]  Cyanocobalamin (B-12 PO) Take 1 tablet by mouth daily.    [provider]  ferrous sulfate 325 (65 FE) MG tablet Take 1 tablet (325 mg total) by mouth 2 (two) times daily. Patient needs office visit for further refills Patient taking differently: Take 325 mg by mouth 2 (two) times daily. 02/24/19   Irene Shipper, MD  furosemide (LASIX) 40 MG tablet TAKE 1 TABLET BY MOUTH EVERY DAY Patient taking differently: Take 40 mg by mouth daily. 10/28/21   [name], MD  Iron-Vitamins (GERITOL) LIQD Take 10 mLs by mouth daily.    [provider]  LOKELMA 10 g PACK packet Take 10 g by mouth daily. 05/04/21   [provider]  megestrol (MEGACE) 40 MG/ML suspension TAKE 10 MLS (400 MG TOTAL) BY MOUTH DAILY. 01/18/22    Wyatt Portela, MD  metoprolol tartrate (LOPRESSOR) 25 MG tablet Take 1 tablet (25 mg total) by mouth 2 (two) times daily. 08/26/21 03/01/22  Loel Dubonnet, NP  Omega-3 Fatty Acids (FISH OIL PO) Take 1 tablet by mouth daily.    [provider]  OVER THE COUNTER MEDICATION Take 1 tablet by mouth daily. Focus factor    [provider]  oxyCODONE ER (XTAMPZA ER) 9 MG C12A Take 1 tablet by mouth 2 (two) times daily as needed. 01/23/22   [name], MD  pantoprazole (PROTONIX) 20 MG tablet Take 1 tablet (20 mg total) by mouth daily. 01/08/22   Little Ishikawa, MD  polyvinyl alcohol (LIQUIFILM TEARS) 1.4 % ophthalmic solution Place 1 drop into both eyes daily as needed for dry eyes.    [provider]  solifenacin (VESICARE) 5 MG tablet TAKE 1 TABLET (5 MG TOTAL) BY MOUTH DAILY. 12/05/21   [name], MD  tamsulosin (FLOMAX) 0.4 MG CAPS capsule TAKE 1 CAPSULE (0.4 MG TOTAL) BY MOUTH DAILY. Patient taking differently: Take 0.4 mg by mouth daily. 01/20/14   [name], MD  zinc gluconate 50 MG tablet Take 1 tablet (50 mg total) by mouth daily. 12/20/21   [name], MD      Allergies    Ace inhibitors, Xyzal [levocetirizine], and Opdivo [nivolumab]    Review of Systems  Review of Systems  Constitutional:  Negative for chills and fever.  HENT:  Negative for congestion.   Eyes:  Negative for visual disturbance.  Respiratory:  Negative for shortness of breath.   Cardiovascular:  Negative for chest pain.  Gastrointestinal:  Negative for abdominal pain and vomiting.  Genitourinary:  Negative for dysuria and flank pain.  Musculoskeletal:  Positive for neck pain. Negative for back pain and neck stiffness.  Skin:  Negative for rash.  Neurological:  Negative for light-headedness and headaches.    Physical Exam Updated Vital Signs BP (!) 186/86 (BP Location: Right Arm)   Pulse 66   Temp 98.2 F (36.8 C) (Oral)   Resp 18   Ht '5\' 11"'$  (1.803 m)   Wt  92.5 kg   SpO2 100%   BMI 28.45 kg/m  Physical Exam Vitals and nursing note reviewed.  Constitutional:      General: He is not in acute distress.    Appearance: He is well-developed.  HENT:     Head: Normocephalic and atraumatic.     Mouth/Throat:     Mouth: Mucous membranes are moist.  Eyes:     General:        Right eye: No discharge.        Left eye: No discharge.     Conjunctiva/sclera: Conjunctivae normal.  Neck:     Trachea: No tracheal deviation.  Cardiovascular:     Rate and Rhythm: Normal rate and regular rhythm.  Pulmonary:     Effort: Pulmonary effort is normal.     Breath sounds: Normal breath sounds.  Abdominal:     General: There is no distension.     Palpations: Abdomen is soft.     Tenderness: There is no abdominal tenderness. There is no guarding.  Musculoskeletal:        General: Tenderness present. No swelling. Normal range of motion.     Cervical back: Normal range of motion and neck supple. No rigidity.     Comments: Patient has tight musculature and tenderness right trapezius region no midline tenderness.  No warmth or signs of infection externally.  Skin:    General: Skin is warm.     Capillary Refill: Capillary refill takes less than 2 seconds.     Findings: No rash.  Neurological:     General: No focal deficit present.     Mental Status: He is alert.     Cranial Nerves: No cranial nerve deficit.     Sensory: No sensory deficit.     Motor: No weakness.  Psychiatric:        Mood and Affect: Mood normal.     ED Results / Procedures / Treatments   Labs (all labs ordered are listed, but only abnormal results are displayed) Labs Reviewed - No data to display  EKG EKG Interpretation  Date/Time:  Sunday March 19 2022 08:58:33 EST Ventricular Rate:  70 PR Interval:  198 QRS Duration: 82 QT Interval:  414 QTC Calculation: 447 R Axis:   7 Text Interpretation: Normal sinus rhythm Septal infarct , age undetermined Abnormal ECG When  compared with ECG of 07-Jan-2022 16:28, PREVIOUS ECG IS PRESENT Confirmed by Elnora Morrison [id number] on 03/19/2022 9:38:04 AM  Radiology No results found.  Procedures Procedures    Medications Ordered in ED Medications  acetaminophen (TYLENOL) tablet 1,000 mg (1,000 mg Oral Given 03/19/22 1055)    ED Course/ Medical Decision Making/ A&P  Medical Decision Making Risk OTC drugs. Prescription drug management.   Patient presents with right neck pain and spasm.  Primary concern for muscle spasm/muscle strain.  No clinical evidence or history consistent with dissection/cardiac/deep space infection.  At this time plan for supportive care Tylenol, will warm packs and only if needed to try muscle relaxant however discussed risks of taking this and sedating side effects.  Patient comfortable this plan.  Tylenol given in the ER.  Patient neurologically doing well no indication for emergent MRI.        Final Clinical Impression(s) / ED Diagnoses Final diagnoses:  Trapezius strain, right, initial encounter    Rx / DC Orders ED Discharge Orders          Ordered    cyclobenzaprine (FLEXERIL) 5 MG tablet  2 times daily PRN        03/19/22 1104              Elnora Morrison, MD 03/19/22 1106

## 2022-03-19 NOTE — ED Triage Notes (Addendum)
R side neck pain x 2 days. Worse with movement. No known injury. Concerned about his heart due to past hx.

## 2022-03-19 NOTE — Discharge Instructions (Signed)
Take 1000 mg of Tylenol every 4 hours in addition to heat pad for your neck.  Minimize lifting anything heavy with your arms over the next week. See your doctor or return the ER if you develop weakness or numbness in your arms and legs, chest pain, shortness of breath, fevers or new concerns. For muscle spasm that will not be relieved by heat packs you can try muscle relaxant medication however be careful as it can make you sleepy so no driving and operating machinery.

## 2022-03-31 ENCOUNTER — Telehealth: Payer: Self-pay | Admitting: *Deleted

## 2022-03-31 NOTE — Telephone Encounter (Signed)
PC to patient, no answer, left VM - informed patient his CT has been authorized by his insurance & may be scheduled at any time by call [employer], [phone].  Instructed patient to call this office with any questions/concerns, phone number given.

## 2022-04-03 DIAGNOSIS — E875 Hyperkalemia: Secondary | ICD-10-CM | POA: Diagnosis not present

## 2022-04-03 DIAGNOSIS — E1122 Type 2 diabetes mellitus with diabetic chronic kidney disease: Secondary | ICD-10-CM | POA: Diagnosis not present

## 2022-04-03 DIAGNOSIS — N185 Chronic kidney disease, stage 5: Secondary | ICD-10-CM | POA: Diagnosis not present

## 2022-04-03 DIAGNOSIS — I120 Hypertensive chronic kidney disease with stage 5 chronic kidney disease or end stage renal disease: Secondary | ICD-10-CM | POA: Diagnosis not present

## 2022-04-03 DIAGNOSIS — N2581 Secondary hyperparathyroidism of renal origin: Secondary | ICD-10-CM | POA: Diagnosis not present

## 2022-04-03 DIAGNOSIS — C649 Malignant neoplasm of unspecified kidney, except renal pelvis: Secondary | ICD-10-CM | POA: Diagnosis not present

## 2022-04-03 DIAGNOSIS — D631 Anemia in chronic kidney disease: Secondary | ICD-10-CM | POA: Diagnosis not present

## 2022-04-05 LAB — BASIC METABOLIC PANEL
BUN: 42 — AB (ref 4–21)
CO2: 17 (ref 13–22)
Chloride: 112 — AB (ref 99–108)
Creatinine: 6.7 — AB (ref 0.6–1.3)
Glucose: 97
Potassium: 3.7 mEq/L (ref 3.5–5.1)
Sodium: 140 (ref 137–147)

## 2022-04-05 LAB — COMPREHENSIVE METABOLIC PANEL
Albumin: 3.9 (ref 3.5–5.0)
Calcium: 9.7 (ref 8.7–10.7)
eGFR: 8

## 2022-04-05 LAB — IRON,TIBC AND FERRITIN PANEL
%SAT: 62
Ferritin: 1894
Iron: 152
TIBC: 247
UIBC: 95

## 2022-04-05 LAB — CBC AND DIFFERENTIAL
HCT: 21 — AB (ref 41–53)
Hemoglobin: 7.3 — AB (ref 13.5–17.5)
Neutrophils Absolute: 2.3
Platelets: 268 10*3/uL (ref 150–400)
WBC: 3.5

## 2022-04-05 LAB — CBC: RBC: 2.39 — AB (ref 3.87–5.11)

## 2022-04-05 LAB — VITAMIN D 25 HYDROXY (VIT D DEFICIENCY, FRACTURES): Vit D, 25-Hydroxy: 29

## 2022-04-07 ENCOUNTER — Emergency Department (HOSPITAL_COMMUNITY): Payer: Medicare Other

## 2022-04-07 ENCOUNTER — Observation Stay (HOSPITAL_COMMUNITY)
Admission: EM | Admit: 2022-04-07 | Discharge: 2022-04-09 | Disposition: A | Discharge status: Home / Self Care | Payer: Medicare Other | Attending: Internal Medicine | Admitting: Internal Medicine

## 2022-04-07 ENCOUNTER — Encounter (HOSPITAL_COMMUNITY): Payer: Self-pay | Admitting: Emergency Medicine

## 2022-04-07 DIAGNOSIS — C7900 Secondary malignant neoplasm of unspecified kidney and renal pelvis: Secondary | ICD-10-CM | POA: Insufficient documentation

## 2022-04-07 DIAGNOSIS — N185 Chronic kidney disease, stage 5: Secondary | ICD-10-CM | POA: Diagnosis present

## 2022-04-07 DIAGNOSIS — Z8505 Personal history of malignant neoplasm of liver: Secondary | ICD-10-CM | POA: Diagnosis not present

## 2022-04-07 DIAGNOSIS — R7989 Other specified abnormal findings of blood chemistry: Secondary | ICD-10-CM | POA: Insufficient documentation

## 2022-04-07 DIAGNOSIS — R630 Anorexia: Secondary | ICD-10-CM | POA: Insufficient documentation

## 2022-04-07 DIAGNOSIS — M542 Cervicalgia: Secondary | ICD-10-CM | POA: Diagnosis not present

## 2022-04-07 DIAGNOSIS — I517 Cardiomegaly: Secondary | ICD-10-CM | POA: Diagnosis not present

## 2022-04-07 DIAGNOSIS — E1136 Type 2 diabetes mellitus with diabetic cataract: Secondary | ICD-10-CM | POA: Diagnosis not present

## 2022-04-07 DIAGNOSIS — Z8546 Personal history of malignant neoplasm of prostate: Secondary | ICD-10-CM | POA: Diagnosis not present

## 2022-04-07 DIAGNOSIS — D631 Anemia in chronic kidney disease: Secondary | ICD-10-CM | POA: Diagnosis present

## 2022-04-07 DIAGNOSIS — I509 Heart failure, unspecified: Secondary | ICD-10-CM | POA: Diagnosis not present

## 2022-04-07 DIAGNOSIS — I10 Essential (primary) hypertension: Secondary | ICD-10-CM | POA: Diagnosis present

## 2022-04-07 DIAGNOSIS — R9089 Other abnormal findings on diagnostic imaging of central nervous system: Secondary | ICD-10-CM | POA: Diagnosis not present

## 2022-04-07 DIAGNOSIS — S2231XA Fracture of one rib, right side, initial encounter for closed fracture: Secondary | ICD-10-CM | POA: Diagnosis not present

## 2022-04-07 DIAGNOSIS — N186 End stage renal disease: Secondary | ICD-10-CM | POA: Insufficient documentation

## 2022-04-07 DIAGNOSIS — I639 Cerebral infarction, unspecified: Secondary | ICD-10-CM | POA: Diagnosis not present

## 2022-04-07 DIAGNOSIS — I63532 Cerebral infarction due to unspecified occlusion or stenosis of left posterior cerebral artery: Secondary | ICD-10-CM | POA: Insufficient documentation

## 2022-04-07 DIAGNOSIS — Z85028 Personal history of other malignant neoplasm of stomach: Secondary | ICD-10-CM | POA: Diagnosis not present

## 2022-04-07 DIAGNOSIS — W19XXXA Unspecified fall, initial encounter: Secondary | ICD-10-CM

## 2022-04-07 DIAGNOSIS — R9402 Abnormal brain scan: Principal | ICD-10-CM | POA: Insufficient documentation

## 2022-04-07 DIAGNOSIS — R519 Headache, unspecified: Secondary | ICD-10-CM | POA: Diagnosis not present

## 2022-04-07 DIAGNOSIS — C649 Malignant neoplasm of unspecified kidney, except renal pelvis: Secondary | ICD-10-CM | POA: Diagnosis present

## 2022-04-07 DIAGNOSIS — E1122 Type 2 diabetes mellitus with diabetic chronic kidney disease: Secondary | ICD-10-CM | POA: Insufficient documentation

## 2022-04-07 DIAGNOSIS — Z79899 Other long term (current) drug therapy: Secondary | ICD-10-CM | POA: Diagnosis not present

## 2022-04-07 DIAGNOSIS — I132 Hypertensive heart and chronic kidney disease with heart failure and with stage 5 chronic kidney disease, or end stage renal disease: Secondary | ICD-10-CM | POA: Diagnosis not present

## 2022-04-07 LAB — CBC
HCT: 23.4 % — ABNORMAL LOW (ref 39.0–52.0)
Hemoglobin: 7.7 g/dL — ABNORMAL LOW (ref 13.0–17.0)
MCH: 31.4 pg (ref 26.0–34.0)
MCHC: 32.9 g/dL (ref 30.0–36.0)
MCV: 95.5 fL (ref 80.0–100.0)
Platelets: 227 10*3/uL (ref 150–400)
RBC: 2.45 MIL/uL — ABNORMAL LOW (ref 4.22–5.81)
RDW: 16.1 % — ABNORMAL HIGH (ref 11.5–15.5)
WBC: 3.9 10*3/uL — ABNORMAL LOW (ref 4.0–10.5)
nRBC: 0 % (ref 0.0–0.2)

## 2022-04-07 LAB — COMPREHENSIVE METABOLIC PANEL
ALT: 17 U/L (ref 0–44)
AST: 25 U/L (ref 15–41)
Albumin: 3.3 g/dL — ABNORMAL LOW (ref 3.5–5.0)
Alkaline Phosphatase: 62 U/L (ref 38–126)
Anion gap: 10 (ref 5–15)
BUN: 41 mg/dL — ABNORMAL HIGH (ref 8–23)
CO2: 17 mmol/L — ABNORMAL LOW (ref 22–32)
Calcium: 9.2 mg/dL (ref 8.9–10.3)
Chloride: 113 mmol/L — ABNORMAL HIGH (ref 98–111)
Creatinine, Ser: 7.04 mg/dL — ABNORMAL HIGH (ref 0.61–1.24)
GFR, Estimated: 8 mL/min — ABNORMAL LOW (ref >60)
Glucose, Bld: 91 mg/dL (ref 70–99)
Potassium: 3.5 mmol/L (ref 3.5–5.1)
Sodium: 140 mmol/L (ref 135–145)
Total Bilirubin: 0.4 mg/dL (ref 0.3–1.2)
Total Protein: 6.8 g/dL (ref 6.5–8.1)

## 2022-04-07 LAB — TYPE AND SCREEN
ABO/RH(D): O POS
Antibody Screen: NEGATIVE

## 2022-04-07 LAB — TROPONIN I (HIGH SENSITIVITY)
Troponin I (High Sensitivity): 351 ng/L (ref <18)
Troponin I (High Sensitivity): 320 ng/L (ref <18)

## 2022-04-07 MED ORDER — ASPIRIN 81 MG PO TBEC
81.0000 mg | DELAYED_RELEASE_TABLET | Freq: Every day | ORAL | Status: DC
  Administered 2022-04-07 – 2022-04-09 (×3): 81 mg via ORAL
  Filled 2022-04-07 (×3): qty 1

## 2022-04-07 MED ORDER — CLOPIDOGREL BISULFATE 75 MG PO TABS
75.0000 mg | ORAL_TABLET | Freq: Every day | ORAL | Status: DC
  Administered 2022-04-07 – 2022-04-09 (×3): 75 mg via ORAL
  Filled 2022-04-07 (×3): qty 1

## 2022-04-07 MED ORDER — ROSUVASTATIN CALCIUM 20 MG PO TABS
20.0000 mg | ORAL_TABLET | Freq: Every day | ORAL | Status: DC
  Administered 2022-04-07 – 2022-04-09 (×3): 20 mg via ORAL
  Filled 2022-04-07 (×3): qty 1

## 2022-04-07 NOTE — ED Notes (Signed)
Patient is alert to person and place.  He denies any pain.  Fiance remains at the bedside.

## 2022-04-07 NOTE — ED Triage Notes (Signed)
Patient here from home reporting fall on Wednesday while in kitchen at night. Reports neck pain and right rib cage pain.

## 2022-04-07 NOTE — Assessment & Plan Note (Signed)
Last A1c <7% diet alone.  Glucose here normal - Hold SS corrections and accuchecks unless hyperglycemic

## 2022-04-07 NOTE — Assessment & Plan Note (Addendum)
Longstanding ESRD.  Has deferred HD.  Seems fairly well compensated.  Follows with Dr. Hollie Salk.  No current acidosis, hyperkalemia, fluid overload or uremia. - Continue Rocaltrol and Lokelma

## 2022-04-07 NOTE — H&P (Signed)
History and Physical    Patient: ESKER Adams YBO:[id number] DOB: 12/26/1945 DOA: 04/07/2022 DOS: the patient was seen and examined on 04/07/2022 PCP: [name], MD  Patient coming from: Home  Chief Complaint:  Chief Complaint  Patient presents with   Fall   Neck Pain   Rib Injury       HPI:  [name] is a 76 y.o. M with hx ESRD not on HD, RCC metastatic to liver, adrenals and lymph nodes no longer on chemo, DM and anemia who presented with fall 2 days ago and neck pain since.  Patient experienced a fall at home 2 days PTA.  He can't remember the event (which fiancee and he actually don't think is unusual because his short term memory has been increasingly impaired in the last 6 months).  Since fall, he has had headache and neck pain, so finally he came to the ER.  In the longer term, he has had progressive memory loss over months and fiancee has noticed new "unsteady gait" in the last few weeks.  In the ER, CT head showed a hypodensity in the left occiput.  Discussed with neurology, who were concerned about stroke.      Review of Systems  Constitutional:  Negative for chills and fever.  Eyes:  Negative for blurred vision and double vision.  Musculoskeletal:  Positive for neck pain.  Neurological:  Positive for sensory change (chronic) and headaches. Negative for speech change, focal weakness, seizures and loss of consciousness.       Ataxia, unsteady  All other systems reviewed and are negative.    Past Medical History:  Diagnosis Date   ADENOCARCINOMA, PROSTATE, GLEASON GRADE 6 12/21/2009   prostate cancer   ALLERGIC RHINITIS 03/16/2007   Allergy    Anxiety    Arthritis    back    BACK PAIN WITH RADICULOPATHY 11/23/2008   Cancer of kidney ([place])    partial right kidney removed   CARPAL TUNNEL SYNDROME, BILATERAL 03/16/2007   Chronic back pain    Constipation    takes Senokot daily   Depression    occasionally   DIABETES MELLITUS, TYPE II  03/16/2007   takes Januvia and MEtformin daily   GASTROINTESTINAL HEMORRHAGE, HX OF 03/16/2007   GERD 03/16/2007   takes Omeprazole daily   Glaucoma    mild - no eye drops   Hemorrhoids    History of colon polyps    HYPERLIPIDEMIA 03/16/2007   takes Crestor daily   HYPERTENSION 03/16/2007   takes Diltiazem and Lisinopril daily    Kidney cancer, primary, with metastasis from kidney to other site [facility])    LEG CRAMPS 05/22/2007   Nocturia    Overactive bladder    PARESTHESIA 05/21/2007   Proctitis    PTSD (post-traumatic stress disorder)    wakes up may be dreaming of fighting   Rectal bleeding    Dr.Norins has explained its from the Radiation that he has received   Rectal bleeding    Renal cell carcinoma ([place])    Sleep apnea    uses CPAP nightly   Stomach cancer ([place])    Urinary frequency    takes Toviaz daily   Past Surgical History:  Procedure Laterality Date   ADRENALECTOMY Right 02/26/2020   Procedure: OPEN RIGHT RENAL CELL METASTATECTOMY AND  ADRENALECTOMY;  Surgeon: [name], MD;  Location: [facility];  Service: Urology;  Laterality: Right;   [place], 2008   repeat surgery;  ESI '08   CARPAL TUNNEL RELEASE     bilateral   CERVICAL FUSION     COLONOSCOPY     colonosocpy     ESOPHAGOGASTRODUODENOSCOPY     ED   FLEXIBLE SIGMOIDOSCOPY N/A 01/08/2017   Procedure: FLEXIBLE SIGMOIDOSCOPY;  Surgeon: Irene Shipper, MD;  Location: [facility];  Service: Endoscopy;  Laterality: N/A;   HOT HEMOSTASIS N/A 01/08/2017   Procedure: HOT HEMOSTASIS (ARGON PLASMA COAGULATION/BICAP);  Surgeon: Irene Shipper, MD;  Location: [facility];  Service: Endoscopy;  Laterality: N/A;   IR RADIOLOGIST EVAL & MGMT  12/17/2019   IR RADIOLOGIST EVAL & MGMT  07/01/2020   IR RADIOLOGIST EVAL & MGMT  08/25/2020   LUMBAR LAMINECTOMY  10/30/2011   Procedure: MICRODISCECTOMY LUMBAR LAMINECTOMY;  Surgeon: Jessy Oto, MD;  Location: [place];  Service: Orthopedics;  Laterality: Right;  Right L4-5  and L5-S1 Microdiscectomy   LUMBAR LAMINECTOMY  06/17/2012   Procedure: MICRODISCECTOMY LUMBAR LAMINECTOMY;  Surgeon: Jessy Oto, MD;  Location: [place];  Service: Orthopedics;  Laterality: N/A;  Right L5-S1 microdiscectomy   OPEN PARTIAL HEPATECTOMY  N/A 02/26/2020   Procedure: OPEN PARTIAL HEPATECTOMY, CHOLECYSTECTOMY;  Surgeon: Stark Klein, MD;  Location: [facility];  Service: General;  Laterality: N/A;   POLYPECTOMY     PROSTATE SURGERY  march 2012   seed implant   RADIOFREQUENCY ABLATION N/A 07/21/2020   Procedure: CT MICROWAVE ABLATION;  Surgeon: Criselda Peaches, MD;  Location: [facility];  Service: Anesthesiology;  Laterality: N/A;   ROBOTIC ASSITED PARTIAL NEPHRECTOMY Right 09/08/2015   Procedure: XI ROBOTIC ASSITED RIGHT PARTIAL NEPHRECTOMY;  Surgeon: [name], MD;  Location: [facility];  Service: Urology;  Laterality: Right;  Clamp on: 1033 Clamp off: 1103 Total Clamp Time: 30 minutes   SHOULDER ARTHROSCOPY WITH SUBACROMIAL DECOMPRESSION Left 01/20/2016   Procedure: LEFT SHOULDER ARTHROSCOPY WITH EXTENSIVE  DEBRIDEMENT, ACROMIOPLASTY;  Surgeon: Ninetta Lights, MD;  Location: [place];  Service: Orthopedics;  Laterality: Left;   SHOULDER SURGERY  LFT   Stress Cardiolite  12/24/2003   negative for ischemia   UPPER ESOPHAGEAL ENDOSCOPIC ULTRASOUND (EUS)  04/18/2016   [facility]   UPPER GASTROINTESTINAL ENDOSCOPY     WOUND EXPLORATION     management of a wound that he sustained in the [place] History:  reports that he has never smoked. He has never used smokeless tobacco. He reports that he does not currently use alcohol. He reports that he does not use drugs.  Allergies  Allergen Reactions   Ace Inhibitors Swelling    lisinopril   Xyzal [Levocetirizine] Other (See Comments)    Break out   Opdivo [Nivolumab] Rash    Family History  Problem Relation Age of Onset   Cancer Sister 55       Cancer, unsure type   Hypertension Other    Colon cancer  Neg Hx    Esophageal cancer Neg Hx    Stomach cancer Neg Hx    Diabetes Neg Hx    Colon polyps Neg Hx    Rectal cancer Neg Hx     Prior to Admission medications   Medication Sig Start Date End Date Taking? Authorizing Provider  acetaminophen (TYLENOL) 500 MG tablet Take 1,000 mg by mouth every 6 (six) hours as needed for mild pain.   Yes [provider]  Apoaequorin (PREVAGEN EXTRA STRENGTH PO) Take 1 tablet by mouth daily.   Yes [provider]  atorvastatin (  LIPITOR) 20 MG tablet Take 1 tablet (20 mg total) by mouth at bedtime. 05/29/21 04/07/22 Yes Kayleen Memos, DO  calcitRIOL (ROCALTROL) 0.25 MCG capsule Take 0.25 mcg by mouth daily. 01/05/22  Yes [provider]  Cyanocobalamin (B-12 PO) Take 1,000 mcg by mouth daily.   Yes [provider]  cyclobenzaprine (FLEXERIL) 5 MG tablet Take 1 tablet (5 mg total) by mouth 2 (two) times daily as needed for muscle spasms. 03/19/22  Yes Elnora Morrison, MD  ferrous sulfate 325 (65 FE) MG tablet Take 1 tablet (325 mg total) by mouth 2 (two) times daily. Patient needs office visit for further refills Patient taking differently: Take 325 mg by mouth daily with breakfast. 02/24/19  Yes Irene Shipper, MD  fluocinonide-emollient (LIDEX-E) 0.05 % cream Apply 1 Application topically 2 (two) times daily as needed (dry skin).   Yes [provider]  furosemide (LASIX) 40 MG tablet TAKE 1 TABLET BY MOUTH EVERY DAY Patient taking differently: Take 40 mg by mouth in the morning, at noon, and at bedtime. 10/28/21  Yes [name], MD  gemfibrozil (LOPID) 600 MG tablet Take 600 mg by mouth 2 (two) times daily before a meal. 02/01/22  Yes [provider]  LOKELMA 10 g PACK packet Take 10 g by mouth daily. 05/04/21  Yes [provider]  megestrol (MEGACE) 40 MG/ML suspension TAKE 10 MLS (400 MG TOTAL) BY MOUTH DAILY. Patient taking differently: Take 400 mg by mouth daily. 01/18/22  Yes Wyatt Portela, MD   metoprolol tartrate (LOPRESSOR) 25 MG tablet Take 1 tablet (25 mg total) by mouth 2 (two) times daily. 08/26/21 04/07/22 Yes Loel Dubonnet, NP  Omega-3 Fatty Acids (FISH OIL PO) Take 1,000 mg by mouth 2 (two) times a week.   Yes [provider]  OVER THE COUNTER MEDICATION Take 1 tablet by mouth daily. Focus factor   Yes [provider]  pantoprazole (PROTONIX) 20 MG tablet Take 1 tablet (20 mg total) by mouth daily. Patient taking differently: Take 20 mg by mouth daily as needed for heartburn or indigestion. 01/08/22  Yes Little Ishikawa, MD  polyvinyl alcohol (LIQUIFILM TEARS) 1.4 % ophthalmic solution Place 1 drop into both eyes daily as needed for dry eyes.   Yes [provider]  solifenacin (VESICARE) 5 MG tablet TAKE 1 TABLET (5 MG TOTAL) BY MOUTH DAILY. 12/05/21  Yes [name], MD  tamsulosin (FLOMAX) 0.4 MG CAPS capsule TAKE 1 CAPSULE (0.4 MG TOTAL) BY MOUTH DAILY. Patient taking differently: Take 0.4 mg by mouth daily. 01/20/14  Yes [name], MD  zinc gluconate 50 MG tablet Take 1 tablet (50 mg total) by mouth daily. 12/20/21  Yes [name], MD    Physical Exam: Vitals:   04/07/22 1600 04/07/22 1630 04/07/22 1645 04/07/22 1648  BP: (!) 155/101 (!) 158/85 (!) 168/83   Pulse: 69 62 60   Resp: '20 18 18   '$ Temp:    98.7 F (37.1 C)  TempSrc:    Oral  SpO2: 100% 100% 100%    Elderly adult male, sitting in the edge of the bed, ambulating unsteadily in the room, interactive and appropriate, responds to questions, hygiene and grooming normal Mild right-sided ptosis, anicteric sclera, conjunctival lids and lashes normal.  No nasal deformity, discharge, or epistaxis, oropharynx moist without oral lesions, dentition in good repair, lips normal No cervical or axillary lymphadenopathy, no supraclavicular adenopathy RRR, no murmurs, no peripheral edema, no JVD  Respiratory rate normal, lungs clear without rales or wheezes Abdomen soft no  tenderness palpation or guarding, no ascites or distention Pupils are 2 mm and equal.  There is a right homonymous hemianopsia.  Mild right ptosis, chronic. Extraocular movements are intact, without nystagmus.  Cranial nerve 5 is within normal limits.  Cranial nerve 7 is symmetrical.  Cranial nerve 8 is within normal limits.  Cranial nerves 9 and 10 reveal equal palate elevation.  Cranial nerve 11 reveals sternocleidomastoid strong.  Cranial nerve 12 is midline.  Motor strength testing is 5/5 in the upper and lower extremities bilaterally with normal motor, tone and bulk.  Finger-to-nose testing is within normal limits. The patient is oriented to time, place and person.  Speech is fluent.  Naming is grossly intact.  Short-term recall appears impaired.  More remote memory and general fund of knowledge seem within normal limits.  Attention span and concentration seem within normal limits.       Data Reviewed: Discussed with neurology CT head independently reviewed, fairly large hypodensity in the left occipital lobe Chest x-ray report reviewed shows no rib fracture, no airspace disease No edema Electrolytes unremarkable, creatinine 7.04 BUN 41, stable relative to previous LFTs normal Hemogram shows hemoglobin 7.7, otherwise unremarkable      Assessment and Plan: * Abnormal brain CT Radiology feel this could be infarct vs edema.  Neurology reviewed, suspect infarct.  Given metastatic RCC not on therapy, and RCC affinity for brain, have to rule this out.  MRI unavailable at [facility] and Neurology favor stroke, so will transfer to [facility] for expedited MRI and also Neurology evaluation.  Had bilateral homonymous hemianopsia on the right. -Obtain MRI brain first - Avoid gadolinium -IF MRI consistnet with stroke, will need echo, CT angiography, carotids  - In the meantime will order: lipids, neuro checks,  - Given the high chance of brain mass, and 2 days elapsed since fall, I feel aspirin is  contraindicated (for brain bleed risk) until MRI obtained -Dysphagia screen ordered in ER -PT eval ordered      Metastatic renal cell carcinoma (HCC) Metastatic to adrenals and liver and lymph nodes.  S/p microwave ablation of liver and adrenal mets.    Was on immunotherapy until this last June when it was discontinued due to side effects per patient preference.  Follows with Dr. Alen Blew.  Anemia due to chronic kidney disease Hgb in the 7s at baseline, currently stable relative to baseline.  Anemia of CKD.  CKD (chronic kidney disease) stage 5, GFR less than 15 ml/min (HCC) Longstanding ESRD.  Has deferred HD.  Seems fairly well compensated.  Follows with Dr. Hollie Salk.  No current acidosis, hyperkalemia, fluid overload or uremia. - Continue Rocaltrol and Lokelma  Essential hypertension BP elevated - Continue metoprolol and lasix  Type 2 diabetes mellitus with diabetic cataract (HCC) Last A1c <7% diet alone.  Glucose here normal - Hold SS corrections and accuchecks unless hyperglycemic         Advance Care Planning: DNR, confirmed with patient and fianc  Consults: Neurology, Dr. Yevonne Pax  Family Communication: Fianc at the bedside  Severity of Illness: The appropriate patient status for this patient is OBSERVATION. Observation status is judged to be reasonable and necessary in order to provide the required intensity of service to ensure the patient's safety. The patient's presenting symptoms, physical exam findings, and initial radiographic and laboratory data in the context of their medical condition is felt to place them at decreased risk for further  clinical deterioration. Furthermore, it is anticipated that the patient will be medically stable for discharge from the hospital within 2 midnights of admission.   Author: Edwin Dada, MD 04/07/2022 5:59 PM  For on call review [email or web address].

## 2022-04-07 NOTE — Assessment & Plan Note (Addendum)
Radiology feel this could be infarct vs edema.  Neurology reviewed, suspect infarct.  Given metastatic RCC not on therapy, and RCC affinity for brain, have to rule this out.  MRI unavailable at [facility] and Neurology favor stroke, so will transfer to [facility] for expedited MRI and also Neurology evaluation.  Had bilateral homonymous hemianopsia on the right. -Obtain MRI brain first - Avoid gadolinium -IF MRI consistnet with stroke, will need echo, CT angiography, carotids  - In the meantime will order: lipids, neuro checks,  - Given the high chance of brain mass, and 2 days elapsed since fall, I feel aspirin is contraindicated (for brain bleed risk) until MRI obtained -Dysphagia screen ordered in ER -PT eval ordered

## 2022-04-07 NOTE — Assessment & Plan Note (Addendum)
Metastatic to adrenals and liver and lymph nodes.  S/p microwave ablation of liver and adrenal mets.    Was on immunotherapy until this last June when it was discontinued due to side effects per patient preference.  Follows with Dr. Alen Blew.

## 2022-04-07 NOTE — ED Notes (Signed)
Call placed to [facility] to request transport of this patient.

## 2022-04-07 NOTE — Assessment & Plan Note (Signed)
Hgb in the 7s at baseline, currently stable relative to baseline.  Anemia of CKD.

## 2022-04-07 NOTE — Hospital Course (Signed)
[name] is a 76 y.o. M with hx ESRD not on HD, RCC metastatic to liver, adrenals and lymph nodes no longer on chemo, DM and anemia who presented with fall 2 days ago and neck pain since.  Patient experienced a fall at home 2 days PTA.  He can't remember the event (which fiancee and he actually don't think is unusual because his short term memory has been increasingly impaired in the last 6 months).  Since fall, he has had headache and neck pain, so finally he came to the ER.  In the longer term, he has had progressive memory loss over months and fiancee has noticed new "unsteady gait" in the last few weeks.  In the ER, CT head showed a hypodensity in the left occiput.  Discussed with neurology, who were concerned about stroke.

## 2022-04-07 NOTE — Consult Note (Signed)
Stroke Neurology Consultation Note  Consult Requested by: Dr. Loleta Books  Reason for Consult: stroke  Consult Date: 04/07/22   The history was obtained from the pt and his fiance.  During history and examination, all items were able to obtain unless otherwise noted.  History of Present Illness:  Antonio Adams is a 76 y.o. African American male with PMH of HTN, DM, CKD4, chronic anemia, LBP, renal cell carcinoma with mets but stable presented to ED for lethargy, fell 2 days ago and HA for one day.   Per pt and fiance, he has been lethargic and "sluggish" since last weekend. Pt usually would have "sluggish" with anemia. He went to see his kidney doctor on Tuesday and he did have anemia and was treated with iron. Wednesday night, he called his fiance that he fell at home but not sure why he fell. Finance asked him to go to hospital but he refused. Thursday pt stayed at home lying in bed. Friday, fiance went to see him and he complained of mild left frontal temporal dull HA, and finance sent him to ER for evaluation. In ER, he was found to have right hemianopia. CT showed subacute left PCA infarct. Neurology consulted. Pt denies any weakness, numbness, speech difficulty.   Pt stated that  his renal cell carcinoma is stable. 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 Not on chemo now due to poor intolerance to the meds. No significant progression on 09/2021 surveillance imaging.   LSN: not sure, but at least 3-5 days ago tPA Given: No: outside window  Past Medical History:  Diagnosis Date   ADENOCARCINOMA, PROSTATE, GLEASON GRADE 6 12/21/2009   prostate cancer   ALLERGIC RHINITIS 03/16/2007   Allergy    Anxiety    Arthritis    back    BACK PAIN WITH RADICULOPATHY 11/23/2008   Cancer of kidney ([place])    partial right kidney removed   CARPAL TUNNEL SYNDROME, BILATERAL 03/16/2007   Chronic back pain     Constipation    takes Senokot daily   Depression    occasionally   DIABETES MELLITUS, TYPE II 03/16/2007   takes Januvia and MEtformin daily   GASTROINTESTINAL HEMORRHAGE, HX OF 03/16/2007   GERD 03/16/2007   takes Omeprazole daily   Glaucoma    mild - no eye drops   Hemorrhoids    History of colon polyps    HYPERLIPIDEMIA 03/16/2007   takes Crestor daily   HYPERTENSION 03/16/2007   takes Diltiazem and Lisinopril daily    Kidney cancer, primary, with metastasis from kidney to other site [facility])    LEG CRAMPS 05/22/2007   Nocturia    Overactive bladder    PARESTHESIA 05/21/2007   Proctitis    PTSD (post-traumatic stress disorder)    wakes up may be dreaming of fighting   Rectal bleeding    Dr.Norins has explained its from the Radiation that he has received   Rectal bleeding    Renal cell carcinoma ([place])    Sleep apnea    uses CPAP nightly   Stomach cancer ([place])    Urinary frequency    takes Toviaz daily    Past Surgical History:  Procedure Laterality Date   ADRENALECTOMY Right 02/26/2020   Procedure: OPEN RIGHT RENAL CELL METASTATECTOMY AND  ADRENALECTOMY;  Surgeon: [name], MD;  Location: [facility];  Service: Urology;  Laterality: Right;   [place],  2008   repeat surgery; ESI '08   CARPAL TUNNEL RELEASE     bilateral   CERVICAL FUSION     COLONOSCOPY     colonosocpy     ESOPHAGOGASTRODUODENOSCOPY     ED   FLEXIBLE SIGMOIDOSCOPY N/A 01/08/2017   Procedure: FLEXIBLE SIGMOIDOSCOPY;  Surgeon: Irene Shipper, MD;  Location: [place];  Service: Endoscopy;  Laterality: N/A;   HOT HEMOSTASIS N/A 01/08/2017   Procedure: HOT HEMOSTASIS (ARGON PLASMA COAGULATION/BICAP);  Surgeon: Irene Shipper, MD;  Location: [facility] ENDOSCOPY;  Service: Endoscopy;  Laterality: N/A;   IR RADIOLOGIST EVAL & MGMT  12/17/2019   IR RADIOLOGIST EVAL & MGMT  07/01/2020   IR RADIOLOGIST EVAL & MGMT  08/25/2020   LUMBAR LAMINECTOMY  10/30/2011   Procedure: MICRODISCECTOMY LUMBAR LAMINECTOMY;   Surgeon: Jessy Oto, MD;  Location: [name];  Service: Orthopedics;  Laterality: Right;  Right L4-5 and L5-S1 Microdiscectomy   LUMBAR LAMINECTOMY  06/17/2012   Procedure: MICRODISCECTOMY LUMBAR LAMINECTOMY;  Surgeon: Jessy Oto, MD;  Location: [place];  Service: Orthopedics;  Laterality: N/A;  Right L5-S1 microdiscectomy   OPEN PARTIAL HEPATECTOMY  N/A 02/26/2020   Procedure: OPEN PARTIAL HEPATECTOMY, CHOLECYSTECTOMY;  Surgeon: Stark Klein, MD;  Location: [facility];  Service: General;  Laterality: N/A;   POLYPECTOMY     PROSTATE SURGERY  march 2012   seed implant   RADIOFREQUENCY ABLATION N/A 07/21/2020   Procedure: CT MICROWAVE ABLATION;  Surgeon: Criselda Peaches, MD;  Location: [facility];  Service: Anesthesiology;  Laterality: N/A;   ROBOTIC ASSITED PARTIAL NEPHRECTOMY Right 09/08/2015   Procedure: XI ROBOTIC ASSITED RIGHT PARTIAL NEPHRECTOMY;  Surgeon: [name], MD;  Location: [facility];  Service: Urology;  Laterality: Right;  Clamp on: 1033 Clamp off: 1103 Total Clamp Time: 30 minutes   SHOULDER ARTHROSCOPY WITH SUBACROMIAL DECOMPRESSION Left 01/20/2016   Procedure: LEFT SHOULDER ARTHROSCOPY WITH EXTENSIVE  DEBRIDEMENT, ACROMIOPLASTY;  Surgeon: Ninetta Lights, MD;  Location: [place];  Service: Orthopedics;  Laterality: Left;   SHOULDER SURGERY  LFT   Stress Cardiolite  12/24/2003   negative for ischemia   UPPER ESOPHAGEAL ENDOSCOPIC ULTRASOUND (EUS)  04/18/2016   [facility]   UPPER GASTROINTESTINAL ENDOSCOPY     WOUND EXPLORATION     management of a wound that he sustained in the [place]    Family History  Problem Relation Age of Onset   Cancer Sister 24       Cancer, unsure type   Hypertension Other    Colon cancer Neg Hx    Esophageal cancer Neg Hx    Stomach cancer Neg Hx    Diabetes Neg Hx    Colon polyps Neg Hx    Rectal cancer Neg Hx     Social History:  reports that he has never smoked. He has never used smokeless tobacco. He reports that he  does not currently use alcohol. He reports that he does not use drugs.  Allergies:  Allergies  Allergen Reactions   Ace Inhibitors Swelling    lisinopril   Xyzal [Levocetirizine] Other (See Comments)    Break out   Opdivo [Nivolumab] Rash    No current facility-administered medications on file prior to encounter.   Current Outpatient Medications on File Prior to Encounter  Medication Sig Dispense Refill   acetaminophen (TYLENOL) 500 MG tablet Take 1,000 mg by mouth every 6 (six) hours as needed for mild pain.     Apoaequorin (PREVAGEN EXTRA STRENGTH  PO) Take 1 tablet by mouth daily.     atorvastatin (LIPITOR) 20 MG tablet Take 1 tablet (20 mg total) by mouth at bedtime. 30 tablet 0   calcitRIOL (ROCALTROL) 0.25 MCG capsule Take 0.25 mcg by mouth daily.     Cyanocobalamin (B-12 PO) Take 1,000 mcg by mouth daily.     cyclobenzaprine (FLEXERIL) 5 MG tablet Take 1 tablet (5 mg total) by mouth 2 (two) times daily as needed for muscle spasms. 6 tablet 0   ferrous sulfate 325 (65 FE) MG tablet Take 1 tablet (325 mg total) by mouth 2 (two) times daily. Patient needs office visit for further refills (Patient taking differently: Take 325 mg by mouth daily with breakfast.) 60 tablet 1   fluocinonide-emollient (LIDEX-E) 0.05 % cream Apply 1 Application topically 2 (two) times daily as needed (dry skin).     furosemide (LASIX) 40 MG tablet TAKE 1 TABLET BY MOUTH EVERY DAY (Patient taking differently: Take 40 mg by mouth in the morning, at noon, and at bedtime.) 90 tablet 1   gemfibrozil (LOPID) 600 MG tablet Take 600 mg by mouth 2 (two) times daily before a meal.     LOKELMA 10 g PACK packet Take 10 g by mouth daily.     megestrol (MEGACE) 40 MG/ML suspension TAKE 10 MLS (400 MG TOTAL) BY MOUTH DAILY. (Patient taking differently: Take 400 mg by mouth daily.) 240 mL 0   metoprolol tartrate (LOPRESSOR) 25 MG tablet Take 1 tablet (25 mg total) by mouth 2 (two) times daily. 180 tablet 3   Omega-3 Fatty  Acids (FISH OIL PO) Take 1,000 mg by mouth 2 (two) times a week.     OVER THE COUNTER MEDICATION Take 1 tablet by mouth daily. Focus factor     pantoprazole (PROTONIX) 20 MG tablet Take 1 tablet (20 mg total) by mouth daily. (Patient taking differently: Take 20 mg by mouth daily as needed for heartburn or indigestion.) 30 tablet 0   polyvinyl alcohol (LIQUIFILM TEARS) 1.4 % ophthalmic solution Place 1 drop into both eyes daily as needed for dry eyes.     solifenacin (VESICARE) 5 MG tablet TAKE 1 TABLET (5 MG TOTAL) BY MOUTH DAILY. 90 tablet 1   tamsulosin (FLOMAX) 0.4 MG CAPS capsule TAKE 1 CAPSULE (0.4 MG TOTAL) BY MOUTH DAILY. (Patient taking differently: Take 0.4 mg by mouth daily.) 30 capsule 1   zinc gluconate 50 MG tablet Take 1 tablet (50 mg total) by mouth daily. 90 tablet 1    Review of Systems: A full ROS was attempted today and was able to be performed.  Systems assessed include - Constitutional, Eyes, HENT, Respiratory, Cardiovascular, Gastrointestinal, Genitourinary, Integument/breast, Hematologic/lymphatic, Musculoskeletal, Neurological, Behavioral/Psych, Endocrine, Allergic/Immunologic - with pertinent responses as per HPI.  Physical Examination: Temp:  [98.7 F (37.1 C)] 98.7 F (37.1 C) (11/24 1648) Pulse Rate:  [60-78] 60 (11/24 1645) Resp:  [15-25] 18 (11/24 1645) BP: (155-173)/(83-101) 168/83 (11/24 1645) SpO2:  [99 %-100 %] 100 % (11/24 1645)  General - well nourished, well developed, in no apparent distress.    Ophthalmologic - fundi not visualized due to noncooperation.    Cardiovascular - regular rhythm and rate  Mental Status -  Level of arousal and orientation to month, place, and person were intact. But not orientated to year.  Language including expression, naming, repetition, comprehension was assessed and found intact. Fund of Knowledge was assessed and was intact.  Cranial Nerves II - XII - II - Vision field test  showed right HH III, IV, VI -  Extraocular movements intact. V - Facial sensation intact bilaterally. VII - Facial movement intact bilaterally. VIII - Hearing & vestibular intact bilaterally. X - Palate elevates symmetrically. XI - Chin turning & shoulder shrug intact bilaterally. XII - Tongue protrusion intact.  Motor Strength - The patient's strength was normal in all extremities and pronator drift was absent.   Motor Tone & Bulk - Muscle tone was assessed at the neck and appendages and was normal.  Bulk was normal and fasciculations were absent.   Reflexes - The patient's reflexes were normal in all extremities and he had no pathological reflexes.  Sensory - Light touch, temperature/pinprick were assessed and were normal.    Coordination - The patient had normal movements in the hands and feet with no ataxia or dysmetria.  Tremor was absent.  Gait and Station - deferred  Data Reviewed: CT Head Wo Contrast  Result Date: 04/07/2022 CLINICAL DATA:  Fall Wednesday, neck pain EXAM: CT HEAD WITHOUT CONTRAST CT CERVICAL SPINE WITHOUT CONTRAST TECHNIQUE: Multidetector CT imaging of the head and cervical spine was performed following the standard protocol without intravenous contrast. Multiplanar CT image reconstructions of the cervical spine were also generated. RADIATION DOSE REDUCTION: This exam was performed according to the departmental dose-optimization program which includes automated exposure control, adjustment of the mA and/or kV according to patient size and/or use of iterative reconstruction technique. COMPARISON:  09/13/2020 FINDINGS: CT HEAD FINDINGS Brain: No evidence of hemorrhage, hydrocephalus, extra-axial collection or mass lesion/mass effect. New cortical and subcortical hypodensity of the medial left occipital lobe (series 3, image 16). Mild periventricular and deep white matter hypodensity. Vascular: No hyperdense vessel or unexpected calcification. Skull: Normal. Negative for fracture or focal lesion.  Sinuses/Orbits: No acute finding. Other: None. CT CERVICAL SPINE FINDINGS Alignment: Normal. Skull base and vertebrae: No acute fracture. No primary bone lesion or focal pathologic process. Soft tissues and spinal canal: No prevertebral fluid or swelling. No visible canal hematoma. Disc levels: Status post anterior cervical discectomy and fusion of C5-C6 with focally severe disc degenerative disease of C6-C7. Otherwise mild multilevel disc space height loss and osteophytosis. Upper chest: Negative. Other: None. IMPRESSION: 1. New cortical and subcortical hypodensity of the medial left occipital lobe, age indeterminate, suggestive of acute to subacute infarction or alternately edema. Consider MRI to further evaluate. 2. No fracture or static subluxation of the cervical spine. 3. Status post anterior cervical discectomy and fusion of C5-C6 with focally severe disc degenerative disease of C6-C7. Electronically Signed   By: Delanna Ahmadi M.D.   On: 04/07/2022 14:54   CT Cervical Spine Wo Contrast  Result Date: 04/07/2022 CLINICAL DATA:  Fall Wednesday, neck pain EXAM: CT HEAD WITHOUT CONTRAST CT CERVICAL SPINE WITHOUT CONTRAST TECHNIQUE: Multidetector CT imaging of the head and cervical spine was performed following the standard protocol without intravenous contrast. Multiplanar CT image reconstructions of the cervical spine were also generated. RADIATION DOSE REDUCTION: This exam was performed according to the departmental dose-optimization program which includes automated exposure control, adjustment of the mA and/or kV according to patient size and/or use of iterative reconstruction technique. COMPARISON:  09/13/2020 FINDINGS: CT HEAD FINDINGS Brain: No evidence of hemorrhage, hydrocephalus, extra-axial collection or mass lesion/mass effect. New cortical and subcortical hypodensity of the medial left occipital lobe (series 3, image 16). Mild periventricular and deep white matter hypodensity. Vascular: No  hyperdense vessel or unexpected calcification. Skull: Normal. Negative for fracture or focal lesion. Sinuses/Orbits: No acute finding. Other:  None. CT CERVICAL SPINE FINDINGS Alignment: Normal. Skull base and vertebrae: No acute fracture. No primary bone lesion or focal pathologic process. Soft tissues and spinal canal: No prevertebral fluid or swelling. No visible canal hematoma. Disc levels: Status post anterior cervical discectomy and fusion of C5-C6 with focally severe disc degenerative disease of C6-C7. Otherwise mild multilevel disc space height loss and osteophytosis. Upper chest: Negative. Other: None. IMPRESSION: 1. New cortical and subcortical hypodensity of the medial left occipital lobe, age indeterminate, suggestive of acute to subacute infarction or alternately edema. Consider MRI to further evaluate. 2. No fracture or static subluxation of the cervical spine. 3. Status post anterior cervical discectomy and fusion of C5-C6 with focally severe disc degenerative disease of C6-C7. Electronically Signed   By: Delanna Ahmadi M.D.   On: 04/07/2022 14:54   DG Ribs Unilateral W/Chest Right  Result Date: 04/07/2022 CLINICAL DATA:  RIGHT chest and rib pain following fall. Initial encounter. EXAM: RIGHT RIBS AND CHEST - 3+ VIEW COMPARISON:  01/07/2022 chest radiograph and prior studies FINDINGS: Cardiomegaly again noted. There is no evidence of focal airspace disease, pulmonary edema, suspicious pulmonary nodule/mass, pleural effusion, or pneumothorax. No acute bony abnormalities are identified. A remote RIGHT 2nd rib fracture is again noted. IMPRESSION: 1. No evidence of acute abnormality. No evidence of acute rib fracture. 2. Cardiomegaly. Electronically Signed   By: Margarette Canada M.D.   On: 04/07/2022 14:35    Assessment: 76 y.o. male with PMH of HTN, DM, CKD4, chronic anemia, LBP, renal cell carcinoma with mets but stable presented to ED for lethargy since last weekend, fell 2 days ago and HA for one day.  In ER, he was found to have right hemianopia. CT showed subacute left PCA infarct. Pt not TNK candidate due to outside window. Not IR candidate due to outside window and complete stroke and low NIHSS. Pt will need further stroke work up including MRI, MRA, echo, CUS, and LE venous doppler plus stroke labs. PT/OT pending. Will start antithrombotics and statin.   Stroke Risk Factors - diabetes mellitus, hypertension, and malignancy  Plan: Continue further stroke work up and transfer to [facility] Frequent neuro checks Telemetry monitoring MRI brain and MRA head Carotid doppler Echocardiogram  LE venous doppler to rule out PFO fasting lipid panel and HgbA1C PT/OT/speech consult GI and DVT prophylaxis  Will start ASA and plavix DAPT Will start crestor for stroke prevention Stroke risk factor modification Discussed with Dr. Loleta Books hospitalist We will follow  Thank you for this consultation and allowing [place] to participate in the care of this patient.  Rosalin Hawking, MD PhD Stroke Neurology 04/07/2022 6:26 PM

## 2022-04-07 NOTE — ED Provider Notes (Signed)
[facility] DEPT Provider Note   CSN: [id number] Arrival date & time: 04/07/22  1301     History  Chief Complaint  Patient presents with   Fall   Neck Pain   Rib Injury    Antonio Adams is a 76 y.o. male.  Patient with history of renal cell carcinoma, CHF, T2DM, hypertension, CKD stage 5 refusing dialysis, GERD, and symptomatic anemia presents today with complaints of fall. He states that same occurred on Wednesday. He is unable to elaborate on details of the fall, is unsure if it was mechanical or not. Is also unsure what he fell on or if he hit his head. States that he was able to get up off the ground and ambulate since without difficulty. Patient endorses neck pain that has been persistent since. Denies any sharp shooting pain down his extremities or numbness/tingling. Also denies any weakness. He also states that he did have some mild right sided ribcage pain but that has since resolved. He is not anticoagulated. Denies fevers, chills, headache, blurred vision, shortness of breath, nausea, vomiting, diarrhea, abdominal pain.  The history is provided by the patient. No language interpreter was used.  Fall  Neck Pain      Home Medications Prior to Admission medications   Medication Sig Start Date End Date Taking? Authorizing Provider  acetaminophen (TYLENOL) 500 MG tablet Take 1,000 mg by mouth every 6 (six) hours as needed for mild pain.   Yes [provider]  Apoaequorin (PREVAGEN EXTRA STRENGTH PO) Take 1 tablet by mouth daily.   Yes [provider]  atorvastatin (LIPITOR) 20 MG tablet Take 1 tablet (20 mg total) by mouth at bedtime. 05/29/21 04/07/22 Yes Kayleen Memos, DO  calcitRIOL (ROCALTROL) 0.25 MCG capsule Take 0.25 mcg by mouth daily. 01/05/22  Yes [provider]  Cyanocobalamin (B-12 PO) Take 1,000 mcg by mouth daily.   Yes [provider]  cyclobenzaprine (FLEXERIL) 5 MG tablet Take 1 tablet  (5 mg total) by mouth 2 (two) times daily as needed for muscle spasms. 03/19/22  Yes Elnora Morrison, MD  ferrous sulfate 325 (65 FE) MG tablet Take 1 tablet (325 mg total) by mouth 2 (two) times daily. Patient needs office visit for further refills Patient taking differently: Take 325 mg by mouth daily with breakfast. 02/24/19  Yes Irene Shipper, MD  fluocinonide-emollient (LIDEX-E) 0.05 % cream Apply 1 Application topically 2 (two) times daily as needed (dry skin).   Yes [provider]  furosemide (LASIX) 40 MG tablet TAKE 1 TABLET BY MOUTH EVERY DAY Patient taking differently: Take 40 mg by mouth in the morning, at noon, and at bedtime. 10/28/21  Yes [name], MD  gemfibrozil (LOPID) 600 MG tablet Take 600 mg by mouth 2 (two) times daily before a meal. 02/01/22  Yes [provider]  LOKELMA 10 g PACK packet Take 10 g by mouth daily. 05/04/21  Yes [provider]  megestrol (MEGACE) 40 MG/ML suspension TAKE 10 MLS (400 MG TOTAL) BY MOUTH DAILY. Patient taking differently: Take 400 mg by mouth daily. 01/18/22  Yes Wyatt Portela, MD  metoprolol tartrate (LOPRESSOR) 25 MG tablet Take 1 tablet (25 mg total) by mouth 2 (two) times daily. 08/26/21 04/07/22 Yes Loel Dubonnet, NP  Omega-3 Fatty Acids (FISH OIL PO) Take 1,000 mg by mouth 2 (two) times a week.   Yes [provider]  OVER THE COUNTER MEDICATION Take 1 tablet by mouth daily.  Focus factor   Yes [provider]  pantoprazole (PROTONIX) 20 MG tablet Take 1 tablet (20 mg total) by mouth daily. Patient taking differently: Take 20 mg by mouth daily as needed for heartburn or indigestion. 01/08/22  Yes Little Ishikawa, MD  polyvinyl alcohol (LIQUIFILM TEARS) 1.4 % ophthalmic solution Place 1 drop into both eyes daily as needed for dry eyes.   Yes [provider]  solifenacin (VESICARE) 5 MG tablet TAKE 1 TABLET (5 MG TOTAL) BY MOUTH DAILY. 12/05/21  Yes [name], MD  tamsulosin  (FLOMAX) 0.4 MG CAPS capsule TAKE 1 CAPSULE (0.4 MG TOTAL) BY MOUTH DAILY. Patient taking differently: Take 0.4 mg by mouth daily. 01/20/14  Yes [name], MD  zinc gluconate 50 MG tablet Take 1 tablet (50 mg total) by mouth daily. 12/20/21  Yes [name], MD  oxyCODONE ER [facility] ER) 9 MG C12A Take 1 tablet by mouth 2 (two) times daily as needed. Patient not taking: Reported on 04/07/2022 01/23/22   [name], MD      Allergies    Ace inhibitors, Xyzal [levocetirizine], and Opdivo [nivolumab]    Review of Systems   Review of Systems  Musculoskeletal:  Positive for neck pain.  All other systems reviewed and are negative.   Physical Exam Updated Vital Signs BP (!) 165/89   Pulse 65   Temp 98.7 F (37.1 C) (Oral)   Resp 15   SpO2 100%  Physical Exam Vitals and nursing note reviewed.  Constitutional:      General: He is not in acute distress.    Appearance: Normal appearance. He is normal weight. He is not ill-appearing, toxic-appearing or diaphoretic.  HENT:     Head: Normocephalic and atraumatic.  Eyes:     Pupils: Pupils are equal, round, and reactive to light.  Cardiovascular:     Rate and Rhythm: Normal rate and regular rhythm.     Heart sounds: Normal heart sounds.     Comments: No tenderness to palpation of the chest. Pulmonary:     Effort: Pulmonary effort is normal. No respiratory distress.     Breath sounds: Normal breath sounds.  Abdominal:     General: Abdomen is flat.     Palpations: Abdomen is soft.  Musculoskeletal:        General: Normal range of motion.     Cervical back: Normal range of motion and neck supple.     Comments: C collar in place over the cervical spine. No tenderness to palpation of palpable areas around the collar of the cervical, thoracic, or lumbar spine. After c collar removed after imaging, mild tenderness noted to cervical spine. No bony tenderness  Skin:    General: Skin is warm and dry.  Neurological:     General: No  focal deficit present.     Mental Status: He is alert.     Comments: Bilateral visual field deficits noted on exam. PERRLA.  5/5 strength and sensation intact in bilateral upper and lower extremities.   Alert and oriented x 3     Psychiatric:        Mood and Affect: Mood normal.        Behavior: Behavior normal.     ED Results / Procedures / Treatments   Labs (all labs ordered are listed, but only abnormal results are displayed) Labs Reviewed  CBC - Abnormal; Notable for the following components:      Result Value   WBC  3.9 (*)    RBC 2.45 (*)    Hemoglobin 7.7 (*)    HCT 23.4 (*)    RDW 16.1 (*)    All other components within normal limits  COMPREHENSIVE METABOLIC PANEL - Abnormal; Notable for the following components:   Chloride 113 (*)    CO2 17 (*)    BUN 41 (*)    Creatinine, Ser 7.04 (*)    Albumin 3.3 (*)    GFR, Estimated 8 (*)    All other components within normal limits  TYPE AND SCREEN    EKG EKG Interpretation  Date/Time:  Friday April 07 2022 13:15:58 EST Ventricular Rate:  72 PR Interval:  202 QRS Duration: 85 QT Interval:  395 QTC Calculation: 433 R Axis:   17 Text Interpretation: Sinus rhythm Abnormal R-wave progression, early transition Nonspecific T abnormalities, lateral leads Baseline wander in lead(s) V3 Confirmed by Margaretmary Eddy [phone]) on 04/07/2022 4:13:04 PM  Radiology CT Head Wo Contrast  Result Date: 04/07/2022 CLINICAL DATA:  Fall Wednesday, neck pain EXAM: CT HEAD WITHOUT CONTRAST CT CERVICAL SPINE WITHOUT CONTRAST TECHNIQUE: Multidetector CT imaging of the head and cervical spine was performed following the standard protocol without intravenous contrast. Multiplanar CT image reconstructions of the cervical spine were also generated. RADIATION DOSE REDUCTION: This exam was performed according to the departmental dose-optimization program which includes automated exposure control, adjustment of the mA and/or kV according to  patient size and/or use of iterative reconstruction technique. COMPARISON:  09/13/2020 FINDINGS: CT HEAD FINDINGS Brain: No evidence of hemorrhage, hydrocephalus, extra-axial collection or mass lesion/mass effect. New cortical and subcortical hypodensity of the medial left occipital lobe (series 3, image 16). Mild periventricular and deep white matter hypodensity. Vascular: No hyperdense vessel or unexpected calcification. Skull: Normal. Negative for fracture or focal lesion. Sinuses/Orbits: No acute finding. Other: None. CT CERVICAL SPINE FINDINGS Alignment: Normal. Skull base and vertebrae: No acute fracture. No primary bone lesion or focal pathologic process. Soft tissues and spinal canal: No prevertebral fluid or swelling. No visible canal hematoma. Disc levels: Status post anterior cervical discectomy and fusion of C5-C6 with focally severe disc degenerative disease of C6-C7. Otherwise mild multilevel disc space height loss and osteophytosis. Upper chest: Negative. Other: None. IMPRESSION: 1. New cortical and subcortical hypodensity of the medial left occipital lobe, age indeterminate, suggestive of acute to subacute infarction or alternately edema. Consider MRI to further evaluate. 2. No fracture or static subluxation of the cervical spine. 3. Status post anterior cervical discectomy and fusion of C5-C6 with focally severe disc degenerative disease of C6-C7. Electronically Signed   By: Delanna Ahmadi M.D.   On: 04/07/2022 14:54   CT Cervical Spine Wo Contrast  Result Date: 04/07/2022 CLINICAL DATA:  Fall Wednesday, neck pain EXAM: CT HEAD WITHOUT CONTRAST CT CERVICAL SPINE WITHOUT CONTRAST TECHNIQUE: Multidetector CT imaging of the head and cervical spine was performed following the standard protocol without intravenous contrast. Multiplanar CT image reconstructions of the cervical spine were also generated. RADIATION DOSE REDUCTION: This exam was performed according to the departmental dose-optimization  program which includes automated exposure control, adjustment of the mA and/or kV according to patient size and/or use of iterative reconstruction technique. COMPARISON:  09/13/2020 FINDINGS: CT HEAD FINDINGS Brain: No evidence of hemorrhage, hydrocephalus, extra-axial collection or mass lesion/mass effect. New cortical and subcortical hypodensity of the medial left occipital lobe (series 3, image 16). Mild periventricular and deep white matter hypodensity. Vascular: No hyperdense vessel or unexpected calcification. Skull: Normal. Negative for  fracture or focal lesion. Sinuses/Orbits: No acute finding. Other: None. CT CERVICAL SPINE FINDINGS Alignment: Normal. Skull base and vertebrae: No acute fracture. No primary bone lesion or focal pathologic process. Soft tissues and spinal canal: No prevertebral fluid or swelling. No visible canal hematoma. Disc levels: Status post anterior cervical discectomy and fusion of C5-C6 with focally severe disc degenerative disease of C6-C7. Otherwise mild multilevel disc space height loss and osteophytosis. Upper chest: Negative. Other: None. IMPRESSION: 1. New cortical and subcortical hypodensity of the medial left occipital lobe, age indeterminate, suggestive of acute to subacute infarction or alternately edema. Consider MRI to further evaluate. 2. No fracture or static subluxation of the cervical spine. 3. Status post anterior cervical discectomy and fusion of C5-C6 with focally severe disc degenerative disease of C6-C7. Electronically Signed   By: Delanna Ahmadi M.D.   On: 04/07/2022 14:54   DG Ribs Unilateral W/Chest Right  Result Date: 04/07/2022 CLINICAL DATA:  RIGHT chest and rib pain following fall. Initial encounter. EXAM: RIGHT RIBS AND CHEST - 3+ VIEW COMPARISON:  01/07/2022 chest radiograph and prior studies FINDINGS: Cardiomegaly again noted. There is no evidence of focal airspace disease, pulmonary edema, suspicious pulmonary nodule/mass, pleural effusion, or  pneumothorax. No acute bony abnormalities are identified. A remote RIGHT 2nd rib fracture is again noted. IMPRESSION: 1. No evidence of acute abnormality. No evidence of acute rib fracture. 2. Cardiomegaly. Electronically Signed   By: Margarette Canada M.D.   On: 04/07/2022 14:35    Procedures Procedures    Medications Ordered in ED Medications - No data to display  ED Course/ Medical Decision Making/ A&P                           Medical Decision Making Amount and/or Complexity of Data Reviewed Labs: ordered. Radiology: ordered.  Risk Decision regarding hospitalization.   This patient is a 76 y.o. male who presents to the ED for concern of fall, this involves an extensive number of treatment options, and is a complaint that carries with it a high risk of complications and morbidity. The emergent differential diagnosis prior to evaluation includes, but is not limited to,  trauma, symptomatic anemia, ACS, CVA .   This is not an exhaustive differential.   Past Medical History / Co-morbidities / Social History: Hx renal cell carcinoma, CHF, T2DM, CKD, symptomatic anemia  Additional history: Chart reviewed. Pertinent results include: patient with CKD stage 5 refusing dialysis  Physical Exam: Physical exam performed. The pertinent findings include: bilateral visual field deficits, no other focal deficits.  Lab Tests: I ordered, and personally interpreted labs.  The pertinent results include:  hgb 7.7 consistent with previous, Creatinine 7.07 consistent with previous. No other acute laboratory findings   Imaging Studies: I ordered imaging studies including DG ribs unilateral with chest right, CT head, CT cervical spine. I independently visualized and interpreted imaging which showed   DG Ribs: no acute findings  CT head, CT cervical spine:   1. New cortical and subcortical hypodensity of the medial left occipital lobe, age indeterminate, suggestive of acute to subacute infarction or  alternately edema. Consider MRI to further evaluate. 2. No fracture or static subluxation of the cervical spine. 3. Status post anterior cervical discectomy and fusion of C5-C6 with focally severe disc degenerative disease of C6-C7.  I agree with the radiologist interpretation.   Cardiac Monitoring:  The patient was maintained on a cardiac monitor.  My attending physician Dr. Philip Aspen  viewed and interpreted the cardiac monitored which showed an underlying rhythm of: no STEMI. I agree with this interpretation.   Consultations Obtained: I requested consultation with the neurology on call Dr. Leonel Ramsay,  and discussed lab and imaging findings as well as pertinent plan - they recommend: admit to medicine at [facility] for further work-up with concerns for a new acute infarct   Disposition:  Patient presents today with complaints of fall 2 days ago. He is afebrile, non-toxic appearing, and in no acute distress with reassuring vital signs. He does have bilateral visual field deficits and a new infarct on CT imaging. Unfortunately, we do not have MRI at [facility] at this time. Patient will require admission for further evaluation and MRI imaging at [facility]. Patient is understanding and amenable with plan.  Discussed patient with hospitalist who agrees to admit.   This is a shared visit with supervising physician Dr. Philip Aspen who has independently evaluated patient & provided guidance in evaluation/management/disposition, in agreement with care     Final Clinical Impression(s) / ED Diagnoses Final diagnoses:  Fall, initial encounter  Cerebrovascular accident (CVA), unspecified mechanism [facility])    Rx / DC Orders ED Discharge Orders     None         Nestor Lewandowsky 04/07/22 1754    Fransico Meadow, MD 04/15/22 [id number]

## 2022-04-07 NOTE — Assessment & Plan Note (Signed)
BP elevated - Continue metoprolol and lasix

## 2022-04-08 ENCOUNTER — Observation Stay (HOSPITAL_COMMUNITY): Payer: Medicare Other

## 2022-04-08 ENCOUNTER — Observation Stay (HOSPITAL_BASED_OUTPATIENT_CLINIC_OR_DEPARTMENT_OTHER): Payer: Medicare Other

## 2022-04-08 ENCOUNTER — Other Ambulatory Visit: Payer: Self-pay

## 2022-04-08 DIAGNOSIS — I6389 Other cerebral infarction: Secondary | ICD-10-CM | POA: Diagnosis not present

## 2022-04-08 DIAGNOSIS — N185 Chronic kidney disease, stage 5: Secondary | ICD-10-CM

## 2022-04-08 DIAGNOSIS — R9402 Abnormal brain scan: Secondary | ICD-10-CM | POA: Diagnosis not present

## 2022-04-08 DIAGNOSIS — Z85028 Personal history of other malignant neoplasm of stomach: Secondary | ICD-10-CM | POA: Diagnosis not present

## 2022-04-08 DIAGNOSIS — I639 Cerebral infarction, unspecified: Secondary | ICD-10-CM | POA: Diagnosis not present

## 2022-04-08 DIAGNOSIS — R7989 Other specified abnormal findings of blood chemistry: Secondary | ICD-10-CM | POA: Diagnosis not present

## 2022-04-08 DIAGNOSIS — R413 Other amnesia: Secondary | ICD-10-CM | POA: Diagnosis not present

## 2022-04-08 DIAGNOSIS — Z8505 Personal history of malignant neoplasm of liver: Secondary | ICD-10-CM | POA: Diagnosis not present

## 2022-04-08 DIAGNOSIS — E1136 Type 2 diabetes mellitus with diabetic cataract: Secondary | ICD-10-CM | POA: Diagnosis not present

## 2022-04-08 DIAGNOSIS — R109 Unspecified abdominal pain: Secondary | ICD-10-CM | POA: Diagnosis not present

## 2022-04-08 DIAGNOSIS — D631 Anemia in chronic kidney disease: Secondary | ICD-10-CM

## 2022-04-08 DIAGNOSIS — Z79899 Other long term (current) drug therapy: Secondary | ICD-10-CM | POA: Diagnosis not present

## 2022-04-08 DIAGNOSIS — R55 Syncope and collapse: Secondary | ICD-10-CM

## 2022-04-08 DIAGNOSIS — M542 Cervicalgia: Secondary | ICD-10-CM | POA: Diagnosis present

## 2022-04-08 DIAGNOSIS — R269 Unspecified abnormalities of gait and mobility: Secondary | ICD-10-CM

## 2022-04-08 DIAGNOSIS — R630 Anorexia: Secondary | ICD-10-CM | POA: Diagnosis not present

## 2022-04-08 DIAGNOSIS — E1122 Type 2 diabetes mellitus with diabetic chronic kidney disease: Secondary | ICD-10-CM | POA: Diagnosis not present

## 2022-04-08 DIAGNOSIS — Z8546 Personal history of malignant neoplasm of prostate: Secondary | ICD-10-CM | POA: Diagnosis not present

## 2022-04-08 DIAGNOSIS — I509 Heart failure, unspecified: Secondary | ICD-10-CM | POA: Diagnosis not present

## 2022-04-08 DIAGNOSIS — C7900 Secondary malignant neoplasm of unspecified kidney and renal pelvis: Secondary | ICD-10-CM | POA: Diagnosis not present

## 2022-04-08 DIAGNOSIS — I132 Hypertensive heart and chronic kidney disease with heart failure and with stage 5 chronic kidney disease, or end stage renal disease: Secondary | ICD-10-CM | POA: Diagnosis not present

## 2022-04-08 DIAGNOSIS — N186 End stage renal disease: Secondary | ICD-10-CM | POA: Diagnosis not present

## 2022-04-08 DIAGNOSIS — I63532 Cerebral infarction due to unspecified occlusion or stenosis of left posterior cerebral artery: Secondary | ICD-10-CM | POA: Diagnosis not present

## 2022-04-08 DIAGNOSIS — I6622 Occlusion and stenosis of left posterior cerebral artery: Secondary | ICD-10-CM | POA: Diagnosis not present

## 2022-04-08 LAB — BASIC METABOLIC PANEL
Anion gap: 14 (ref 5–15)
BUN: 39 mg/dL — ABNORMAL HIGH (ref 8–23)
CO2: 17 mmol/L — ABNORMAL LOW (ref 22–32)
Calcium: 9.7 mg/dL (ref 8.9–10.3)
Chloride: 111 mmol/L (ref 98–111)
Creatinine, Ser: 6.95 mg/dL — ABNORMAL HIGH (ref 0.61–1.24)
GFR, Estimated: 8 mL/min — ABNORMAL LOW (ref >60)
Glucose, Bld: 108 mg/dL — ABNORMAL HIGH (ref 70–99)
Potassium: 3.5 mmol/L (ref 3.5–5.1)
Sodium: 142 mmol/L (ref 135–145)

## 2022-04-08 LAB — ECHOCARDIOGRAM COMPLETE
AR max vel: 2.42 cm2
AV Area VTI: 2.38 cm2
AV Area mean vel: 2.38 cm2
AV Mean grad: 5 mmHg
AV Peak grad: 9.4 mmHg
Ao pk vel: 1.53 m/s
Area-P 1/2: 3.37 cm2
Calc EF: 53.6 %
Height: 71 in
S' Lateral: 3.8 cm
Single Plane A2C EF: 42.9 %
Single Plane A4C EF: 61.8 %
Weight: 3072.33 oz

## 2022-04-08 LAB — CBC
HCT: 21.9 % — ABNORMAL LOW (ref 39.0–52.0)
Hemoglobin: 7.3 g/dL — ABNORMAL LOW (ref 13.0–17.0)
MCH: 31.5 pg (ref 26.0–34.0)
MCHC: 33.3 g/dL (ref 30.0–36.0)
MCV: 94.4 fL (ref 80.0–100.0)
Platelets: 217 10*3/uL (ref 150–400)
RBC: 2.32 MIL/uL — ABNORMAL LOW (ref 4.22–5.81)
RDW: 16 % — ABNORMAL HIGH (ref 11.5–15.5)
WBC: 3.7 10*3/uL — ABNORMAL LOW (ref 4.0–10.5)
nRBC: 0 % (ref 0.0–0.2)

## 2022-04-08 LAB — LIPID PANEL
Cholesterol: 219 mg/dL — ABNORMAL HIGH (ref 0–200)
HDL: 37 mg/dL — ABNORMAL LOW (ref >40)
LDL Cholesterol: 159 mg/dL — ABNORMAL HIGH (ref 0–99)
Total CHOL/HDL Ratio: 5.9 RATIO
Triglycerides: 113 mg/dL (ref <150)
VLDL: 23 mg/dL (ref 0–40)

## 2022-04-08 MED ORDER — FESOTERODINE FUMARATE ER 4 MG PO TB24
4.0000 mg | ORAL_TABLET | Freq: Every day | ORAL | Status: DC
  Administered 2022-04-08 – 2022-04-09 (×2): 4 mg via ORAL
  Filled 2022-04-08 (×2): qty 1

## 2022-04-08 MED ORDER — TAMSULOSIN HCL 0.4 MG PO CAPS
0.4000 mg | ORAL_CAPSULE | Freq: Every day | ORAL | Status: DC
  Administered 2022-04-08 – 2022-04-09 (×2): 0.4 mg via ORAL
  Filled 2022-04-08 (×2): qty 1

## 2022-04-08 MED ORDER — ACETAMINOPHEN 500 MG PO TABS: 1000.0000 mg | ORAL_TABLET | Freq: Four times a day (QID) | ORAL | Status: DC | PRN

## 2022-04-08 MED ORDER — SODIUM ZIRCONIUM CYCLOSILICATE 10 G PO PACK: 10.0000 g | PACK | Freq: Every day | ORAL | Status: DC

## 2022-04-08 MED ORDER — ONDANSETRON HCL 4 MG/2ML IJ SOLN: 4.0000 mg | Freq: Four times a day (QID) | INTRAMUSCULAR | Status: DC | PRN

## 2022-04-08 MED ORDER — ATORVASTATIN CALCIUM 10 MG PO TABS: 20.0000 mg | ORAL_TABLET | Freq: Every day | ORAL | Status: DC

## 2022-04-08 MED ORDER — STROKE: EARLY STAGES OF RECOVERY BOOK
Freq: Once | Status: AC
  Administered 2022-04-08: 1
  Filled 2022-04-08: qty 1

## 2022-04-08 MED ORDER — MEGESTROL ACETATE 400 MG/10ML PO SUSP
400.0000 mg | Freq: Every day | ORAL | Status: DC
  Administered 2022-04-08 – 2022-04-09 (×2): 400 mg via ORAL
  Filled 2022-04-08 (×2): qty 10

## 2022-04-08 MED ORDER — GEMFIBROZIL 600 MG PO TABS
600.0000 mg | ORAL_TABLET | Freq: Two times a day (BID) | ORAL | Status: DC
  Administered 2022-04-08 – 2022-04-09 (×3): 600 mg via ORAL
  Filled 2022-04-08 (×4): qty 1

## 2022-04-08 MED ORDER — ONDANSETRON HCL 4 MG PO TABS: 4.0000 mg | ORAL_TABLET | Freq: Four times a day (QID) | ORAL | Status: DC | PRN

## 2022-04-08 MED ORDER — SODIUM BICARBONATE 650 MG PO TABS
650.0000 mg | ORAL_TABLET | Freq: Two times a day (BID) | ORAL | Status: DC
  Administered 2022-04-08 (×2): 650 mg via ORAL
  Filled 2022-04-08 (×2): qty 1

## 2022-04-08 MED ORDER — FUROSEMIDE 40 MG PO TABS
40.0000 mg | ORAL_TABLET | Freq: Three times a day (TID) | ORAL | Status: DC
  Administered 2022-04-08 – 2022-04-09 (×4): 40 mg via ORAL
  Filled 2022-04-08 (×4): qty 1

## 2022-04-08 MED ORDER — METOPROLOL TARTRATE 25 MG PO TABS
25.0000 mg | ORAL_TABLET | Freq: Two times a day (BID) | ORAL | Status: DC
  Administered 2022-04-08 – 2022-04-09 (×4): 25 mg via ORAL
  Filled 2022-04-08 (×4): qty 1

## 2022-04-08 MED ORDER — OXYCODONE HCL 5 MG PO TABS: 5.0000 mg | ORAL_TABLET | ORAL | Status: DC | PRN

## 2022-04-08 MED ORDER — SODIUM ZIRCONIUM CYCLOSILICATE 10 G PO PACK
10.0000 g | PACK | Freq: Every day | ORAL | Status: DC
  Administered 2022-04-08: 10 g via ORAL
  Filled 2022-04-08: qty 1

## 2022-04-08 MED ORDER — PANTOPRAZOLE SODIUM 20 MG PO TBEC: 20.0000 mg | DELAYED_RELEASE_TABLET | Freq: Every day | ORAL | Status: DC | PRN

## 2022-04-08 MED ORDER — CALCITRIOL 0.25 MCG PO CAPS
0.2500 ug | ORAL_CAPSULE | Freq: Every day | ORAL | Status: DC
  Administered 2022-04-08 – 2022-04-09 (×2): 0.25 ug via ORAL
  Filled 2022-04-08 (×2): qty 1

## 2022-04-08 NOTE — Progress Notes (Signed)
Pt ambulated to bathroom with 1x assist. Pt declined walker stating greater ease of use with cane. Unsteady gait.

## 2022-04-08 NOTE — Progress Notes (Addendum)
EEG complete - results pending 

## 2022-04-08 NOTE — Care Management Obs Status (Signed)
[place] NOTIFICATION   Patient Details  Name: Antonio Adams MRN: [id number] Date of Birth: Feb 19, 1946   Medicare Observation Status Notification Given:  Yes    Garrett Mitchum G., RN 04/08/2022, 10:43 AM

## 2022-04-08 NOTE — Progress Notes (Signed)
STROKE TEAM PROGRESS NOTE   SUBJECTIVE (INTERVAL HISTORY) His fiance is at the bedside.  Overall his condition is unchanged.  Still has right dense hemianopia.  Educated on no driving due to hemianopia.   OBJECTIVE Temp:  [98 F (36.7 C)-98.8 F (37.1 C)] 98.2 F (36.8 C) (11/25 0741) Pulse Rate:  [60-76] 65 (11/25 0741) Cardiac Rhythm: Normal sinus rhythm (11/25 0700) Resp:  [15-25] 16 (11/25 0741) BP: (155-184)/(79-103) 167/87 (11/25 0741) SpO2:  [98 %-100 %] 100 % (11/25 0741) Weight:  [87.1 kg] 87.1 kg (11/25 0110)  No results for input(s): "GLUCAP" in the last 168 hours. Recent Labs  Lab 04/07/22 1325 04/08/22 0321  NA 140 142  K 3.5 3.5  CL 113* 111  CO2 17* 17*  GLUCOSE 91 108*  BUN 41* 39*  CREATININE 7.04* 6.95*  CALCIUM 9.2 9.7   Recent Labs  Lab 04/07/22 1325  AST 25  ALT 17  ALKPHOS 62  BILITOT 0.4  PROT 6.8  ALBUMIN 3.3*   Recent Labs  Lab 04/07/22 1325 04/08/22 0321  WBC 3.9* 3.7*  HGB 7.7* 7.3*  HCT 23.4* 21.9*  MCV 95.5 94.4  PLT 227 217   No results for input(s): "CKTOTAL", "CKMB", "CKMBINDEX", "TROPONINI" in the last 168 hours. No results for input(s): "LABPROT", "INR" in the last 72 hours. No results for input(s): "COLORURINE", "LABSPEC", "PHURINE", "GLUCOSEU", "HGBUR", "BILIRUBINUR", "KETONESUR", "PROTEINUR", "UROBILINOGEN", "NITRITE", "LEUKOCYTESUR" in the last 72 hours.  Invalid input(s): "APPERANCEUR"     Component Value Date/Time   CHOL 219 (H) 04/08/2022 0321   TRIG 113 04/08/2022 0321   HDL 37 (L) 04/08/2022 0321   CHOLHDL 5.9 04/08/2022 0321   VLDL 23 04/08/2022 0321   LDLCALC 159 (H) 04/08/2022 0321   Lab Results  Component Value Date   HGBA1C 5.8 (H) 05/23/2021   No results found for: "LABOPIA", "COCAINSCRNUR", "LABBENZ", "AMPHETMU", "THCU", "LABBARB"  No results for input(s): "ETH" in the last 168 hours.  I have personally reviewed the radiological images below and agree with the radiology interpretations.  MR  BRAIN WO CONTRAST  Result Date: 04/08/2022 CLINICAL DATA:  76 year old male with neurologic deficit. Age indeterminate left PCA territory infarct on head CT yesterday. EXAM: MRI HEAD WITHOUT CONTRAST MRA HEAD WITHOUT CONTRAST TECHNIQUE: Multiplanar, multi-echo pulse sequences of the brain and surrounding structures were acquired without intravenous contrast. Angiographic images of the intracranial vasculature using MRA technique without intravenous contrast. COMPARISON:  Head CT yesterday.  Brain MRI 09/13/2020. FINDINGS: MRI HEAD WITHOUT CONTRAST Brain: Restricted diffusion in the left PCA territory including the left thalamus, medial left occipital lobe, along the calcarine sulcus, left parietooccipital sulcus. Associated T2 and FLAIR hyperintense cytotoxic edema, T1 hypointensity, with questionable mild petechial hemorrhage. Mild mass effect on the left occipital horn. Superimposed trace restricted diffusion also in the right occipital lobe on series 5, image 70. No posterior fossa or anterior circulation diffusion restriction identified. No midline shift, mass effect, evidence of mass lesion, ventriculomegaly, extra-axial collection. Cervicomedullary junction and pituitary are within normal limits. Mild chronic microhemorrhage along the right parieto-occipital sulcus and in the right occipital pole seems new since 2022. No cortical encephalomalacia identified. Vascular: Major intracranial vascular flow voids are stable since last year, see MRA findings below. Skull and upper cervical spine: Negative for age visible cervical spine. Visualized bone marrow signal is within normal limits. Sinuses/Orbits: Negative. Other: Mastoids remain clear. Grossly normal visible internal auditory structures. MRA Antegrade flow in the posterior circulation with dominant distal vertebral artery. Both PICA  origins and distal vertebral arteries are patent. Patent basilar artery. No distal vertebral or basilar stenosis. Patent SCA  and PCA origins. Posterior communicating arteries are diminutive or absent. Left PCA P1 and P2 segments are patent. P3 bifurcation is patent. Probable occlusion of the superior left P3 division. Contralateral right PCA branches appear patent and within normal limits. Antegrade flow in both ICA siphons with tortuous ICAs just below the skull base. Right greater than left siphon irregularity, cannot exclude hemodynamically significant stenosis in the right petrous and cavernous segments. Left ICA siphon irregularity without significant stenosis. Patent carotid termini. Left ACA A1 is dominant, right is diminutive or absent. Anterior communicating artery and visible ACA branches are within normal limits. MCA M1 segments and bi/trifurcations are patent without stenosis. Visible MCA branches are within normal limits. IMPRESSION: 1. 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 Also punctate contralateral right PCA infarct. Possible mild petechial hemorrhage. But no malignant hemorrhagic transformation or significant intracranial mass effect. 2. Intracranial MRA is negative for large vessel occlusion. But positive for evidence of Left PCA P3 branch occlusion, and ICA siphon atherosclerosis with possible tandem significant stenoses of the Right siphon. Electronically Signed   By: [name] M.D.   On: 04/08/2022 12:29   MR ANGIO HEAD WO CONTRAST  Result Date: 04/08/2022 CLINICAL DATA:  76 year old male with neurologic deficit. Age indeterminate left PCA territory infarct on head CT yesterday. EXAM: MRI HEAD WITHOUT CONTRAST MRA HEAD WITHOUT CONTRAST TECHNIQUE: Multiplanar, multi-echo pulse sequences of the brain and surrounding structures were acquired without intravenous contrast. Angiographic images of the intracranial vasculature using MRA technique without intravenous contrast. COMPARISON:  Head CT yesterday.  Brain MRI 09/13/2020. FINDINGS: MRI HEAD WITHOUT CONTRAST Brain:  Restricted diffusion in the left PCA territory including the left thalamus, medial left occipital lobe, along the calcarine sulcus, left parietooccipital sulcus. Associated T2 and FLAIR hyperintense cytotoxic edema, T1 hypointensity, with questionable mild petechial hemorrhage. Mild mass effect on the left occipital horn. Superimposed trace restricted diffusion also in the right occipital lobe on series 5, image 70. No posterior fossa or anterior circulation diffusion restriction identified. No midline shift, mass effect, evidence of mass lesion, ventriculomegaly, extra-axial collection. Cervicomedullary junction and pituitary are within normal limits. Mild chronic microhemorrhage along the right parieto-occipital sulcus and in the right occipital pole seems new since 2022. No cortical encephalomalacia identified. Vascular: Major intracranial vascular flow voids are stable since last year, see MRA findings below. Skull and upper cervical spine: Negative for age visible cervical spine. Visualized bone marrow signal is within normal limits. Sinuses/Orbits: Negative. Other: Mastoids remain clear. Grossly normal visible internal auditory structures. MRA Antegrade flow in the posterior circulation with dominant distal vertebral artery. Both PICA origins and distal vertebral arteries are patent. Patent basilar artery. No distal vertebral or basilar stenosis. Patent SCA and PCA origins. Posterior communicating arteries are diminutive or absent. Left PCA P1 and P2 segments are patent. P3 bifurcation is patent. Probable occlusion of the superior left P3 division. Contralateral right PCA branches appear patent and within normal limits. Antegrade flow in both ICA siphons with tortuous ICAs just below the skull base. Right greater than left siphon irregularity, cannot exclude hemodynamically significant stenosis in the right petrous and cavernous segments. Left ICA siphon irregularity without significant stenosis. Patent  carotid termini. Left ACA A1 is dominant, right is diminutive or absent. Anterior communicating artery and visible ACA branches are within normal limits. MCA M1 segments and bi/trifurcations are patent without stenosis.  Visible MCA branches are within normal limits. IMPRESSION: 1. 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 Also punctate contralateral right PCA infarct. Possible mild petechial hemorrhage. But no malignant hemorrhagic transformation or significant intracranial mass effect. 2. Intracranial MRA is negative for large vessel occlusion. But positive for evidence of Left PCA P3 branch occlusion, and ICA siphon atherosclerosis with possible tandem significant stenoses of the Right siphon. Electronically Signed   By: [name] M.D.   On: 04/08/2022 12:29   DG Abd 2 Views  Result Date: 04/08/2022 CLINICAL DATA:  Abdominal pain. EXAM: ABDOMEN - 2 VIEW COMPARISON:  Abdomen pelvis CT 10/04/2021 FINDINGS: Mild gaseous distention of small bowel and colon is seen in the abdomen and pelvis without overtly obstructive pattern. Surgical clips in the right upper quadrant suggest prior cholecystectomy. Brachytherapy seeds overlie the expected location of the prostate gland. Degenerative changes noted lumbar spine. IMPRESSION: Mild gaseous distention of small bowel and colon without overtly obstructive pattern. Enteritis or ileus would be a consideration. Electronically Signed   By: Misty Stanley M.D.   On: 04/08/2022 11:52   VAS US CAROTID  Result Date: 04/08/2022 Carotid Arterial Duplex Study Patient Name:  REBECCA MOTTA  Date of Exam:   04/08/2022 Medical Rec #: [id number]            Accession #:    [id number] Date of Birth: 16-Mar-1946           Patient Gender: M Patient Age:   54 years Exam Location:  [facility] Procedure:      VAS US CAROTID Referring Phys: Cornelius Moras Shaquira Moroz --------------------------------------------------------------------------------   Indications:       Progressive memory loss, gait disturbance, question CVA. Risk Factors:      Diabetes. Limitations        Today's exam was limited due to the high bifurcation of the                    carotid. Comparison Study:  Prior normal Carotid duplex done 10/14/14 Performing Technologist: Sharion Dove RVS  Examination Guidelines: A complete evaluation includes B-mode imaging, spectral Doppler, color Doppler, and power Doppler as needed of all accessible portions of each vessel. Bilateral testing is considered an integral part of a complete examination. Limited examinations for reoccurring indications may be performed as noted.  Right Carotid Findings: +----------+--------+--------+--------+------------------+--------+           PSV cm/sEDV cm/sStenosisPlaque DescriptionComments +----------+--------+--------+--------+------------------+--------+ CCA Prox  94      18              heterogenous               +----------+--------+--------+--------+------------------+--------+ CCA Distal65      19              homogeneous                +----------+--------+--------+--------+------------------+--------+ ICA Prox  43      14              heterogenous               +----------+--------+--------+--------+------------------+--------+ ICA Mid   49      14                                         +----------+--------+--------+--------+------------------+--------+ ICA Distal44      12                                         +----------+--------+--------+--------+------------------+--------+  ECA       54      9                                          +----------+--------+--------+--------+------------------+--------+ +----------+--------+-------+--------+-------------------+           PSV cm/sEDV cmsDescribeArm Pressure (mmHG) +----------+--------+-------+--------+-------------------+ Subclavian116                                         +----------+--------+-------+--------+-------------------+ +---------+--------+--+--------+-+ VertebralPSV cm/s31EDV cm/s9 +---------+--------+--+--------+-+  Left Carotid Findings: +----------+--------+--------+--------+------------------+------------------+           PSV cm/sEDV cm/sStenosisPlaque DescriptionComments           +----------+--------+--------+--------+------------------+------------------+ CCA Prox  109     19                                intimal thickening +----------+--------+--------+--------+------------------+------------------+ CCA Distal86      18                                intimal thickening +----------+--------+--------+--------+------------------+------------------+ ICA Prox  46      12              heterogenous                         +----------+--------+--------+--------+------------------+------------------+ ICA Mid   48      14                                                   +----------+--------+--------+--------+------------------+------------------+ ICA Distal51      15                                                   +----------+--------+--------+--------+------------------+------------------+ ECA       73      18                                                   +----------+--------+--------+--------+------------------+------------------+ +----------+--------+--------+--------+-------------------+           PSV cm/sEDV cm/sDescribeArm Pressure (mmHG) +----------+--------+--------+--------+-------------------+ [id number]                                          +----------+--------+--------+--------+-------------------+ +---------+--------+--+--------+--+ VertebralPSV cm/s71EDV cm/s20 +---------+--------+--+--------+--+   Summary: Right Carotid: The extracranial vessels were near-normal with only minimal wall                thickening or plaque. Left Carotid: The extracranial vessels were near-normal  with only minimal wall               thickening or plaque. Vertebrals:  Bilateral vertebral  arteries demonstrate antegrade flow. Subclavians: Normal flow hemodynamics were seen in bilateral subclavian              arteries. *See table(s) above for measurements and observations.     Preliminary    VAS [place] LOWER EXTREMITY VENOUS (DVT)  Result Date: 04/08/2022  Lower Venous DVT Study Patient Name:  ORLEY LAWRY  Date of Exam:   04/08/2022 Medical Rec #: [id number]            Accession #:    [id number] Date of Birth: May 19, 1945           Patient Gender: M Patient Age:   57 years Exam Location:  [facility] Procedure:      VAS [place] LOWER EXTREMITY VENOUS (DVT) Referring Phys: Cornelius Moras Connery Shiffler --------------------------------------------------------------------------------  Indications: Stroke.  Comparison Study: No prior study Performing Technologist: Sharion Dove RVS  Examination Guidelines: A complete evaluation includes B-mode imaging, spectral Doppler, color Doppler, and power Doppler as needed of all accessible portions of each vessel. Bilateral testing is considered an integral part of a complete examination. Limited examinations for reoccurring indications may be performed as noted. The reflux portion of the exam is performed with the patient in reverse Trendelenburg.  +---------+---------------+---------+-----------+----------+--------------+ RIGHT    CompressibilityPhasicitySpontaneityPropertiesThrombus Aging +---------+---------------+---------+-----------+----------+--------------+ CFV      Full           Yes      Yes                                 +---------+---------------+---------+-----------+----------+--------------+ SFJ      Full                                                        +---------+---------------+---------+-----------+----------+--------------+ FV Prox  Full                                                         +---------+---------------+---------+-----------+----------+--------------+ FV Mid   Full                                                        +---------+---------------+---------+-----------+----------+--------------+ FV DistalFull                                                        +---------+---------------+---------+-----------+----------+--------------+ PFV      Full                                                        +---------+---------------+---------+-----------+----------+--------------+ POP      Full  Yes      Yes                                 +---------+---------------+---------+-----------+----------+--------------+ PTV      Full                                                        +---------+---------------+---------+-----------+----------+--------------+ PERO     Full                                                        +---------+---------------+---------+-----------+----------+--------------+   +---------+---------------+---------+-----------+----------+--------------+ LEFT     CompressibilityPhasicitySpontaneityPropertiesThrombus Aging +---------+---------------+---------+-----------+----------+--------------+ CFV      Full           Yes      Yes                                 +---------+---------------+---------+-----------+----------+--------------+ SFJ      Full                                                        +---------+---------------+---------+-----------+----------+--------------+ FV Prox  Full                                                        +---------+---------------+---------+-----------+----------+--------------+ FV Mid   Full                                                        +---------+---------------+---------+-----------+----------+--------------+ FV DistalFull                                                         +---------+---------------+---------+-----------+----------+--------------+ PFV      Full                                                        +---------+---------------+---------+-----------+----------+--------------+ POP      Full           Yes      Yes                                 +---------+---------------+---------+-----------+----------+--------------+ PTV  Full                                                        +---------+---------------+---------+-----------+----------+--------------+ PERO     Full                                                        +---------+---------------+---------+-----------+----------+--------------+     Summary: BILATERAL: - No evidence of deep vein thrombosis seen in the lower extremities, bilaterally. -No evidence of popliteal cyst, bilaterally.   *See table(s) above for measurements and observations.    Preliminary    CT Head Wo Contrast  Result Date: 04/07/2022 CLINICAL DATA:  Fall Wednesday, neck pain EXAM: CT HEAD WITHOUT CONTRAST CT CERVICAL SPINE WITHOUT CONTRAST TECHNIQUE: Multidetector CT imaging of the head and cervical spine was performed following the standard protocol without intravenous contrast. Multiplanar CT image reconstructions of the cervical spine were also generated. RADIATION DOSE REDUCTION: This exam was performed according to the departmental dose-optimization program which includes automated exposure control, adjustment of the mA and/or kV according to patient size and/or use of iterative reconstruction technique. COMPARISON:  09/13/2020 FINDINGS: CT HEAD FINDINGS Brain: No evidence of hemorrhage, hydrocephalus, extra-axial collection or mass lesion/mass effect. New cortical and subcortical hypodensity of the medial left occipital lobe (series 3, image 16). Mild periventricular and deep white matter hypodensity. Vascular: No hyperdense vessel or unexpected calcification. Skull: Normal. Negative for fracture  or focal lesion. Sinuses/Orbits: No acute finding. Other: None. CT CERVICAL SPINE FINDINGS Alignment: Normal. Skull base and vertebrae: No acute fracture. No primary bone lesion or focal pathologic process. Soft tissues and spinal canal: No prevertebral fluid or swelling. No visible canal hematoma. Disc levels: Status post anterior cervical discectomy and fusion of C5-C6 with focally severe disc degenerative disease of C6-C7. Otherwise mild multilevel disc space height loss and osteophytosis. Upper chest: Negative. Other: None. IMPRESSION: 1. New cortical and subcortical hypodensity of the medial left occipital lobe, age indeterminate, suggestive of acute to subacute infarction or alternately edema. Consider MRI to further evaluate. 2. No fracture or static subluxation of the cervical spine. 3. Status post anterior cervical discectomy and fusion of C5-C6 with focally severe disc degenerative disease of C6-C7. Electronically Signed   By: Delanna Ahmadi M.D.   On: 04/07/2022 14:54   CT Cervical Spine Wo Contrast  Result Date: 04/07/2022 CLINICAL DATA:  Fall Wednesday, neck pain EXAM: CT HEAD WITHOUT CONTRAST CT CERVICAL SPINE WITHOUT CONTRAST TECHNIQUE: Multidetector CT imaging of the head and cervical spine was performed following the standard protocol without intravenous contrast. Multiplanar CT image reconstructions of the cervical spine were also generated. RADIATION DOSE REDUCTION: This exam was performed according to the departmental dose-optimization program which includes automated exposure control, adjustment of the mA and/or kV according to patient size and/or use of iterative reconstruction technique. COMPARISON:  09/13/2020 FINDINGS: CT HEAD FINDINGS Brain: No evidence of hemorrhage, hydrocephalus, extra-axial collection or mass lesion/mass effect. New cortical and subcortical hypodensity of the medial left occipital lobe (series 3, image 16). Mild periventricular and deep white matter hypodensity.  Vascular: No hyperdense vessel or unexpected calcification. Skull: Normal. Negative for fracture or focal lesion.  Sinuses/Orbits: No acute finding. Other: None. CT CERVICAL SPINE FINDINGS Alignment: Normal. Skull base and vertebrae: No acute fracture. No primary bone lesion or focal pathologic process. Soft tissues and spinal canal: No prevertebral fluid or swelling. No visible canal hematoma. Disc levels: Status post anterior cervical discectomy and fusion of C5-C6 with focally severe disc degenerative disease of C6-C7. Otherwise mild multilevel disc space height loss and osteophytosis. Upper chest: Negative. Other: None. IMPRESSION: 1. New cortical and subcortical hypodensity of the medial left occipital lobe, age indeterminate, suggestive of acute to subacute infarction or alternately edema. Consider MRI to further evaluate. 2. No fracture or static subluxation of the cervical spine. 3. Status post anterior cervical discectomy and fusion of C5-C6 with focally severe disc degenerative disease of C6-C7. Electronically Signed   By: Delanna Ahmadi M.D.   On: 04/07/2022 14:54   DG Ribs Unilateral W/Chest Right  Result Date: 04/07/2022 CLINICAL DATA:  RIGHT chest and rib pain following fall. Initial encounter. EXAM: RIGHT RIBS AND CHEST - 3+ VIEW COMPARISON:  01/07/2022 chest radiograph and prior studies FINDINGS: Cardiomegaly again noted. There is no evidence of focal airspace disease, pulmonary edema, suspicious pulmonary nodule/mass, pleural effusion, or pneumothorax. No acute bony abnormalities are identified. A remote RIGHT 2nd rib fracture is again noted. IMPRESSION: 1. No evidence of acute abnormality. No evidence of acute rib fracture. 2. Cardiomegaly. Electronically Signed   By: Margarette Canada M.D.   On: 04/07/2022 14:35     PHYSICAL EXAM  Temp:  [98 F (36.7 C)-98.8 F (37.1 C)] 98.2 F (36.8 C) (11/25 0741) Pulse Rate:  [60-76] 65 (11/25 0741) Resp:  [15-25] 16 (11/25 0741) BP: (155-184)/(79-103)  167/87 (11/25 0741) SpO2:  [98 %-100 %] 100 % (11/25 0741) Weight:  [87.1 kg] 87.1 kg (11/25 0110)  General - Well nourished, well developed, in no apparent distress.  Ophthalmologic - fundi not visualized due to noncooperation.  Cardiovascular - Regular rhythm and rate.  Mental Status -  Level of arousal and orientation to month, place, and person were intact. But not orientated to year.  Language including expression, naming, repetition, comprehension was assessed and found intact. Fund of Knowledge was assessed and was intact.   Cranial Nerves II - XII - II - Vision field test showed right HH III, IV, VI - Extraocular movements intact. V - Facial sensation intact bilaterally. VII - Facial movement intact bilaterally. VIII - Hearing & vestibular intact bilaterally. X - Palate elevates symmetrically. XI - Chin turning & shoulder shrug intact bilaterally. XII - Tongue protrusion intact.   Motor Strength - The patient's strength was normal in all extremities and pronator drift was absent.    Motor Tone & Bulk - Muscle tone was assessed at the neck and appendages and was normal.  Bulk was normal and fasciculations were absent.    Reflexes - The patient's reflexes were normal in all extremities and he had no pathological reflexes.   Sensory - Light touch, temperature/pinprick were assessed and were normal.     Coordination - The patient had normal movements in the hands and feet with no ataxia or dysmetria.  Tremor was absent.   Gait and Station - deferred   ASSESSMENT/PLAN [name] is a 76 y.o. male with history of HTN, DM, CKD4, chronic anemia, LBP, renal cell carcinoma with mets but stable presented to ED for lethargy since last weekend, fell 2 days ago and HA for one day. CT showed subacute left PCA infarct. Pt not TNK  candidate due to outside window.   Stroke:  left PCA large infarct and punctate right PCA infarct, embolic pattern, concerning for  cardioembolic source CT left PCA infarct MRI  left PCA large infarct and punctate right PCA infarct, MRA  Left PCA P3 branch occlusion, and ICA siphon atherosclerosis with possible tandem significant stenoses of the Right siphon. Carotid Doppler  unremarkable 2D Echo  pending LE venous doppler no DVT Recommend loop recorder if work up unrevealing LDL 159 HgbA1c pending SCDs for VTE prophylaxis No antithrombotic prior to admission, now on aspirin 81 mg daily and clopidogrel 75 mg daily DAPT for 3 months and then ASA alone given left P3 occlusion.  Patient counseled to be compliant with his antithrombotic medications Ongoing aggressive stroke risk factor management Therapy recommendations:  outpt PT Disposition:  penidng  Diabetes HgbA1c pending goal < 7.0 Controlled CBG monitoring SSI DM education and close PCP follow up  Hypertension Stable Long term BP goal normotensive  Hyperlipidemia Home meds:  lipitor 20 and lopid  LDL 159, goal < 70 Now on crestor 20 and lopid Continue statin at discharge  Other Stroke Risk Factors Advanced age renal cell carcinoma with mets but stable, follows with oncology  Other Active Problems CKD 4 Chronic anemia LBP  Hospital day # 0    Rosalin Hawking, MD PhD Stroke Neurology 04/08/2022 1:08 PM    To contact Stroke Continuity provider, please refer to [email or web address] After hours, contact General Neurology

## 2022-04-08 NOTE — Procedures (Signed)
Routine EEG Report  Antonio Adams is a 76 y.o. male with a history of syncope who is undergoing an EEG to evaluate for seizures.  Report: This EEG was acquired with electrodes placed according to the International 10-20 electrode system (including Fp1, Fp2, F3, F4, C3, C4, P3, P4, O1, O2, T3, T4, T5, T6, A1, A2, Fz, Cz, Pz). The following electrodes were missing or displaced: none.  The occipital dominant rhythm was 8.5 Hz. This activity is reactive to stimulation. Drowsiness was manifested by background fragmentation; deeper stages of sleep were identified by K complexes and sleep spindles. There was no focal slowing. There were no interictal epileptiform discharges. There were no electrographic seizures identified. Photic stimulation and hyperventilation were not performed.  Impression: This EEG was obtained while awake and asleep and is normal.    Clinical Correlation: Normal EEGs, however, do not rule out epilepsy.  Su Monks, MD [facility] [phone]  If 7pm- 7am, please page neurology on call as listed in [place].

## 2022-04-08 NOTE — Progress Notes (Signed)
  Echocardiogram 2D Echocardiogram has been performed.  Lana Fish 04/08/2022, 4:01 PM

## 2022-04-08 NOTE — ED Notes (Signed)
[facility] has arrived and patient is being transported to [facility].  Family is aware of same.  Receiving RN is aware of same

## 2022-04-08 NOTE — Plan of Care (Signed)

## 2022-04-08 NOTE — Progress Notes (Signed)
VASCULAR LAB    Carotid duplex has been performed.  See CV proc for preliminary results.   Gerald Kuehl, RVT 04/08/2022, 10:32 AM

## 2022-04-08 NOTE — Progress Notes (Signed)
VASCULAR LAB    Bilateral lower extremity venous duplex has been performed.  See CV proc for preliminary results.   Vannia Pola, RVT 04/08/2022, 10:32 AM

## 2022-04-08 NOTE — Progress Notes (Signed)
OT Cancellation Note  Patient Details Name: MIHAIL PRETTYMAN MRN: [id number] DOB: November 12, 1945   Cancelled Treatment:    Reason Eval/Treat Not Completed: Patient at procedure or test/ unavailable. Off floor for imaging. OT to check back as time allows.   Gloris Manchester OTR/L Supplemental OT, Department of rehab services [phone]  Devanie Galanti R H. 04/08/2022, 10:37 AM

## 2022-04-08 NOTE — ED Notes (Signed)
Patient is agreeable to stay.  Crackers and cheese given to patient and his fiance

## 2022-04-08 NOTE — Progress Notes (Addendum)
PROGRESS NOTE        PATIENT DETAILS Name: Antonio Adams Age: 76 y.o. Sex: male Date of Birth: 19-Aug-1945 Admit Date: 04/07/2022 Admitting Physician Edwin Dada, MD PJK:DTOIZ, [name], MD  Brief Summary: Patient is a 76 y.o.  male with history of metastatic renal cell carcinoma, CKD 5, chronic normocytic anemia who presented to the ED after sustaining a fall vs syncope-CT head showed left occipital lobe hypodensity-with concern for left PCA infarct-although mass/edema from underlying malignancy was not definitely ruled out.  Patient was subsequently transferred to [facility] for further evaluation.  Significant events: 11/24>> admit to [facility] for fall/syncope-possible acute CVA on CT head.  Transferred to [facility] for neuro/further imaging.  Significant studies: 11/24>> x-ray ribs right/chest: No fracture/no PNA. 11/24>> CT head: Cortical/subcortical density left occipital lobe. 11/24>> CT C-spine: S/p ACDF C5-C6 with disc degenerative disease at C6-C7 11/25>> LDL: 159 11/25>> A1c: Pending  Significant microbiology data: None  Procedures: None  Consults: Neurology  Subjective: Lying comfortably in bed-denies any chest pain or shortness of breath.  He does not know whether he passed out or fell when he first presented to the ED.  Claims he found himself on the floor when he woke up.  Objective: Vitals: Blood pressure (!) 167/87, pulse 65, temperature 98.2 F (36.8 C), temperature source Oral, resp. rate 16, height '5\' 11"'$  (1.803 m), weight 87.1 kg, SpO2 100 %.   Exam: Gen Exam:Alert awake-not in any distress HEENT:atraumatic, normocephalic Chest: B/L clear to auscultation anteriorly CVS:S1S2 regular Abdomen:soft non tender, non distended Extremities:no edema Neurology: Non focal Skin: no rash  Pertinent Labs/Radiology:    Latest Ref Rng & Units 04/08/2022    3:21 AM 04/07/2022    1:25 PM 03/13/2022   10:38 AM  CBC  WBC 4.0 -  10.5 K/uL 3.7  3.9    Hemoglobin 13.0 - 17.0 g/dL 7.3  7.7  7.1   Hematocrit 39.0 - 52.0 % 21.9  23.4    Platelets 150 - 400 K/uL 217  227      Lab Results  Component Value Date   NA 142 04/08/2022   K 3.5 04/08/2022   CL 111 04/08/2022   CO2 17 (L) 04/08/2022      Assessment/Plan: Fall versus syncope Poor historian-does not exactly remember how he found himself on the floor Telemetry negative Await echo Check spot EEG Await MRI brain Check orthostatics Mobilize with PT/OT  Left PCA territory CVA versus edema/mass Reviewed neuro note-suspected to have left PCA infarct,but given history of malignancy-need MRI to rule out other causes including edema/mass Continues to have right homonymous hemianopsia this morning Await MRI brain and further stroke workup Remains on dual antiplatelets per stroke MD recommendation  Minimally elevated troponin No anginal symptoms Suspect false positive elevation in setting of CKD Await echo-assess EF/wall motion  CKD stage V Follows with Dr. 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   Weight as of this encounter: 87.1 kg.   Code status:   Code Status: DNR   DVT Prophylaxis: SCDs Start: 04/08/22 0140   Family Communication: Fianc at bedside   Disposition Plan: Status is: Observation The patient will require care spanning > 2 midnights and should be moved to inpatient because: Syncope/possible CVA/brain mass-workup in progress-not yet stable for discharge.   Planned Discharge Destination:Home health   Diet: Diet Order             Diet renal with fluid restriction Fluid restriction: 1200 mL Fluid; Room service appropriate? Yes; Fluid consistency: Thin  Diet effective now                     Antimicrobial agents: Anti-infectives (From admission, onward)    None        MEDICATIONS: Scheduled Meds:  aspirin EC  81 mg Oral Daily   calcitRIOL  0.25 mcg Oral Daily   clopidogrel  75 mg Oral Daily   fesoterodine  4 mg Oral Daily   furosemide  40 mg Oral TID AC   gemfibrozil  600 mg Oral BID AC   megestrol  400 mg Oral Daily   metoprolol tartrate  25 mg Oral BID   rosuvastatin  20 mg Oral Daily   sodium zirconium cyclosilicate  10 g Oral Daily   tamsulosin  0.4 mg Oral Daily   Continuous Infusions: PRN Meds:.acetaminophen, ondansetron **OR** ondansetron (ZOFRAN) IV, oxyCODONE, pantoprazole   I have personally reviewed following labs and imaging studies  LABORATORY DATA: CBC: Recent Labs  Lab 04/07/22 1325 04/08/22 0321  WBC 3.9* 3.7*  HGB 7.7* 7.3*  HCT 23.4* 21.9*  MCV 95.5 94.4  PLT 227 539    Basic Metabolic Panel: Recent Labs  Lab 04/07/22 1325 04/08/22 0321  NA 140 142  K 3.5 3.5  CL 113* 111  CO2 17* 17*  GLUCOSE 91 108*  BUN 41* 39*  CREATININE 7.04* 6.95*  CALCIUM 9.2 9.7    GFR: Estimated Creatinine Clearance: 9.8 mL/min (A) (by C-G formula based on SCr of 6.95 mg/dL (H)).  Liver Function Tests: Recent Labs  Lab 04/07/22 1325  AST 25  ALT 17  ALKPHOS 62   BILITOT 0.4  PROT 6.8  ALBUMIN 3.3*   No results for input(s): "LIPASE", "AMYLASE" in the last 168 hours. No results for input(s): "AMMONIA" in the last 168 hours.  Coagulation Profile: No results for input(s): "INR", "PROTIME" in the last 168 hours.  Cardiac Enzymes: No results for input(s): "CKTOTAL", "CKMB", "CKMBINDEX", "TROPONINI" in the last 168 hours.  BNP (last 3 results) Recent Labs    07/20/21 1119  PROBNP 119.0*    Lipid Profile: Recent Labs    04/08/22 0321  CHOL 219*  HDL 37*  LDLCALC 159*  TRIG 113  CHOLHDL 5.9    Thyroid Function Tests: No results for input(s): "TSH", "T4TOTAL", "FREET4", "T3FREE", "THYROIDAB" in the last 72 hours.  Anemia Panel: No results for input(s): "VITAMINB12", "FOLATE", "FERRITIN", "TIBC", "IRON", "RETICCTPCT" in the last 72 hours.  Urine analysis:    Component Value Date/Time   COLORURINE STRAW (A) 05/23/2021 7673  APPEARANCEUR CLEAR 05/23/2021 0902   LABSPEC 1.012 05/23/2021 0902   PHURINE 5.0 05/23/2021 0902   GLUCOSEU NEGATIVE 05/23/2021 0902   GLUCOSEU 250 (A) 05/20/2020 1606   HGBUR MODERATE (A) 05/23/2021 0902   BILIRUBINUR NEGATIVE 05/23/2021 0902   BILIRUBINUR neg 03/21/2012 1127   KETONESUR NEGATIVE 05/23/2021 0902   PROTEINUR 100 (A) 05/23/2021 0902   UROBILINOGEN 0.2 05/20/2020 1606   NITRITE NEGATIVE 05/23/2021 0902   LEUKOCYTESUR NEGATIVE 05/23/2021 0902    Sepsis Labs: Lactic Acid, Venous    Component Value Date/Time   LATICACIDVEN 1.0 05/23/2021 0422    MICROBIOLOGY: No results found for this or any previous visit (from the past 240 hour(s)).  RADIOLOGY STUDIES/RESULTS: CT Head Wo Contrast  Result Date: 04/07/2022 CLINICAL DATA:  Fall Wednesday, neck pain EXAM: CT HEAD WITHOUT CONTRAST CT CERVICAL SPINE WITHOUT CONTRAST TECHNIQUE: Multidetector CT imaging of the head and cervical spine was performed following the standard protocol without intravenous contrast. Multiplanar CT image  reconstructions of the cervical spine were also generated. RADIATION DOSE REDUCTION: This exam was performed according to the departmental dose-optimization program which includes automated exposure control, adjustment of the mA and/or kV according to patient size and/or use of iterative reconstruction technique. COMPARISON:  09/13/2020 FINDINGS: CT HEAD FINDINGS Brain: No evidence of hemorrhage, hydrocephalus, extra-axial collection or mass lesion/mass effect. New cortical and subcortical hypodensity of the medial left occipital lobe (series 3, image 16). Mild periventricular and deep white matter hypodensity. Vascular: No hyperdense vessel or unexpected calcification. Skull: Normal. Negative for fracture or focal lesion. Sinuses/Orbits: No acute finding. Other: None. CT CERVICAL SPINE FINDINGS Alignment: Normal. Skull base and vertebrae: No acute fracture. No primary bone lesion or focal pathologic process. Soft tissues and spinal canal: No prevertebral fluid or swelling. No visible canal hematoma. Disc levels: Status post anterior cervical discectomy and fusion of C5-C6 with focally severe disc degenerative disease of C6-C7. Otherwise mild multilevel disc space height loss and osteophytosis. Upper chest: Negative. Other: None. IMPRESSION: 1. New cortical and subcortical hypodensity of the medial left occipital lobe, age indeterminate, suggestive of acute to subacute infarction or alternately edema. Consider MRI to further evaluate. 2. No fracture or static subluxation of the cervical spine. 3. Status post anterior cervical discectomy and fusion of C5-C6 with focally severe disc degenerative disease of C6-C7. Electronically Signed   By: Delanna Ahmadi M.D.   On: 04/07/2022 14:54   CT Cervical Spine Wo Contrast  Result Date: 04/07/2022 CLINICAL DATA:  Fall Wednesday, neck pain EXAM: CT HEAD WITHOUT CONTRAST CT CERVICAL SPINE WITHOUT CONTRAST TECHNIQUE: Multidetector CT imaging of the head and cervical spine was  performed following the standard protocol without intravenous contrast. Multiplanar CT image reconstructions of the cervical spine were also generated. RADIATION DOSE REDUCTION: This exam was performed according to the departmental dose-optimization program which includes automated exposure control, adjustment of the mA and/or kV according to patient size and/or use of iterative reconstruction technique. COMPARISON:  09/13/2020 FINDINGS: CT HEAD FINDINGS Brain: No evidence of hemorrhage, hydrocephalus, extra-axial collection or mass lesion/mass effect. New cortical and subcortical hypodensity of the medial left occipital lobe (series 3, image 16). Mild periventricular and deep white matter hypodensity. Vascular: No hyperdense vessel or unexpected calcification. Skull: Normal. Negative for fracture or focal lesion. Sinuses/Orbits: No acute finding. Other: None. CT CERVICAL SPINE FINDINGS Alignment: Normal. Skull base and vertebrae: No acute fracture. No primary bone lesion or focal pathologic process. Soft tissues and spinal canal: No prevertebral fluid or swelling. No visible canal  hematoma. Disc levels: Status post anterior cervical discectomy and fusion of C5-C6 with focally severe disc degenerative disease of C6-C7. Otherwise mild multilevel disc space height loss and osteophytosis. Upper chest: Negative. Other: None. IMPRESSION: 1. New cortical and subcortical hypodensity of the medial left occipital lobe, age indeterminate, suggestive of acute to subacute infarction or alternately edema. Consider MRI to further evaluate. 2. No fracture or static subluxation of the cervical spine. 3. Status post anterior cervical discectomy and fusion of C5-C6 with focally severe disc degenerative disease of C6-C7. Electronically Signed   By: Delanna Ahmadi M.D.   On: 04/07/2022 14:54   DG Ribs Unilateral W/Chest Right  Result Date: 04/07/2022 CLINICAL DATA:  RIGHT chest and rib pain following fall. Initial encounter. EXAM:  RIGHT RIBS AND CHEST - 3+ VIEW COMPARISON:  01/07/2022 chest radiograph and prior studies FINDINGS: Cardiomegaly again noted. There is no evidence of focal airspace disease, pulmonary edema, suspicious pulmonary nodule/mass, pleural effusion, or pneumothorax. No acute bony abnormalities are identified. A remote RIGHT 2nd rib fracture is again noted. IMPRESSION: 1. No evidence of acute abnormality. No evidence of acute rib fracture. 2. Cardiomegaly. Electronically Signed   By: Margarette Canada M.D.   On: 04/07/2022 14:35     LOS: 0 days   Oren Binet, MD  [facility]    To contact the attending provider between 7A-7P or the covering provider during after hours 7P-7A, please log into the web site [email or web address] and access using universal [place] password for that web site. If you do not have the password, please call the hospital operator.  04/08/2022, 9:11 AM

## 2022-04-08 NOTE — Evaluation (Signed)
Physical Therapy Evaluation Patient Details Name: Antonio Adams MRN: [id number] DOB: [birth year] Today's Date: 04/08/2022  History of Present Illness  76 y.o. M who presented with fall 2 days ago and neck pain since. CT head showed a hypodensity in the left occiput, neurology concerned for CVA. +right homonymous hemianopsia; MRI pending; PMH ESRD not on HD, renal cancer metastatic to liver, adrenals and lymph nodes no longer on chemo, DM and anemia  Clinical Impression   Pt admitted secondary to problem above with deficits below. PTA patient was living alone and using a cane due to decr balance. Reports this was his only fall at home and cannot describe circumstances.  Pt currently requires minguard assist to walk 180 ft with cane without loss of balance requiring outside assistance. Aspects of his ambulation are at baseline per fiance (incr lateral shift to left in left stance) but he is not walking as quickly as normal and pt reports his gait is not back to baseline. He had no problem avoiding obstacles on his left or his right. Feel he can return home with prn supervision/assist by fiance and OPPT. Will also defer to OT once they have completed an ADL assessment as this is often more difficult for patients.  Anticipate patient will benefit from PT to address problems listed below.Will continue to follow acutely to maximize functional mobility independence and safety.          Recommendations for follow up therapy are one component of a multi-disciplinary discharge planning process, led by the attending physician.  Recommendations may be updated based on patient status, additional functional criteria and insurance authorization.  Follow Up Recommendations Outpatient PT       Assistance Recommended at Discharge Intermittent Supervision/Assistance  Patient can return home with the following  A little help with walking and/or transfers;Assist for transportation    Equipment  Recommendations None recommended by PT  Recommendations for Other Services  OT consult    Functional Status Assessment Patient has had a recent decline in their functional status and demonstrates the ability to make significant improvements in function in a reasonable and predictable amount of time.     Precautions / Restrictions Precautions Precautions: Fall Precaution Comments: pt with recent fall in kitchen and not sure what happened (unwitnessed)      Mobility  Bed Mobility Overal bed mobility: Modified Independent             General bed mobility comments: incr time and effort to come to sit with feet on the floor    Transfers Overall transfer level: Needs assistance Equipment used: Straight cane Transfers: Sit to/from Stand Sit to Stand: Supervision           General transfer comment: for safety due to recent fall    Ambulation/Gait Ambulation/Gait assistance: Min guard Gait Distance (Feet): 180 Feet Assistive device: Straight cane Gait Pattern/deviations: Step-through pattern, Decreased step length - left, Decreased weight shift to right, Knee flexed in stance - left   Gait velocity interpretation: 1.31 - 2.62 ft/sec, indicative of limited community ambulator   General Gait Details: Fiance reports he is walking more slowly and has been less steady on his feet recently; reports his incr left lateral shift with his shoulders is normal for his baseline  Stairs Stairs: Yes Stairs assistance: Supervision Stair Management: One rail Right, Step to pattern, With cane Number of Stairs: 2 General stair comments: no imbalance noted  Wheelchair Mobility    Modified Rankin (Stroke Patients Only)  Balance Overall balance assessment: Needs assistance Sitting-balance support: No upper extremity supported, Feet supported Sitting balance-Leahy Scale: Good     Standing balance support: No upper extremity supported Standing balance-Leahy Scale: Fair                    Standardized Balance Assessment Standardized Balance Assessment : Berg Balance Test Berg Balance Test Sit to Stand: Able to stand  independently using hands Standing Unsupported: Able to stand safely 2 minutes Sitting with Back Unsupported but Feet Supported on Floor or Stool: Able to sit safely and securely 2 minutes Stand to Sit: Sits safely with minimal use of hands Transfers: Able to transfer safely, definite need of hands Standing Unsupported with Eyes Closed: Able to stand 10 seconds with supervision Standing Ubsupported with Feet Together: Able to place feet together independently and stand for 1 minute with supervision From Standing, Reach Forward with Outstretched Arm: Can reach confidently >25 cm (10") From Standing Position, Pick up Object from Floor: Able to pick up shoe safely and easily From Standing Position, Turn to Look Behind Over each Shoulder: Turn sideways only but maintains balance Turn 360 Degrees: Able to turn 360 degrees safely but slowly Standing Unsupported, Alternately Place Feet on Step/Stool: Needs assistance to keep from falling or unable to try Standing Unsupported, One Foot in Front: Able to take small step independently and hold 30 seconds Standing on One Leg: Tries to lift leg/unable to hold 3 seconds but remains standing independently Total Score: 39         Pertinent Vitals/Pain Pain Assessment Pain Assessment: No/denies pain    Home Living Family/patient expects to be discharged to:: Private residence Living Arrangements: Alone Available Help at Discharge: Family;Available PRN/intermittently (fiance) Type of Home: House Home Access: Ramped entrance       Home Layout: One level Home Equipment: Cane - single point (denies any additional equipment)      Prior Function Prior Level of Function : Independent/Modified Independent             Mobility Comments: cane at all times recently       Hand Dominance    Dominant Hand: Right    Extremity/Trunk Assessment   Upper Extremity Assessment Upper Extremity Assessment: Defer to OT evaluation    Lower Extremity Assessment Lower Extremity Assessment: RLE deficits/detail;LLE deficits/detail RLE Deficits / Details: knee extension 4/5, ankle DF 5/5 RLE Sensation: WNL RLE Coordination: WNL LLE Deficits / Details: knee extension 3+, ankle DF 4/5 LLE Sensation: WNL LLE Coordination: decreased fine motor    Cervical / Trunk Assessment Cervical / Trunk Assessment: Kyphotic  Communication   Communication: No difficulties  Cognition Arousal/Alertness: Awake/alert Behavior During Therapy: WFL for tasks assessed/performed Overall Cognitive Status: History of cognitive impairments - at baseline (not specifically tested; reports short-term memory deficits)                                          General Comments General comments (skin integrity, edema, etc.): Fiance present. Agrees with need for follow-up therapy.    Exercises     Assessment/Plan    PT Assessment Patient needs continued PT services  PT Problem List Decreased strength;Decreased balance;Decreased mobility;Decreased coordination;Decreased safety awareness       PT Treatment Interventions DME instruction;Gait training;Functional mobility training;Therapeutic activities;Therapeutic exercise;Balance training;Neuromuscular re-education;Patient/family education    PT Goals (Current goals can be found in  the Care Plan section)  Acute Rehab PT Goals Patient Stated Goal: return home PT Goal Formulation: With patient Time For Goal Achievement: 04/22/22 Potential to Achieve Goals: Good    Frequency Min 3X/week     Co-evaluation               AM-PAC PT "6 Clicks" Mobility  Outcome Measure Help needed turning from your back to your side while in a flat bed without using bedrails?: None Help needed moving from lying on your back to sitting on the side of a  flat bed without using bedrails?: None Help needed moving to and from a bed to a chair (including a wheelchair)?: A Little Help needed standing up from a chair using your arms (e.g., wheelchair or bedside chair)?: A Little Help needed to walk in hospital room?: A Little Help needed climbing 3-5 steps with a railing? : A Little 6 Click Score: 20    End of Session Equipment Utilized During Treatment: Gait belt Activity Tolerance: Patient tolerated treatment well Patient left: in bed;with call bell/phone within reach;with bed alarm set (Echo coming to do test and returned to bed) Nurse Communication: Mobility status PT Visit Diagnosis: Unsteadiness on feet (R26.81);History of falling (Z91.81)    Time: 0352-4818 PT Time Calculation (min) (ACUTE ONLY): 24 min   Charges:   PT Evaluation $PT Eval Low Complexity: [place], PT Acute Rehabilitation Services  Office [phone]   Rexanne Mano 04/08/2022, 8:53 AM

## 2022-04-08 NOTE — Progress Notes (Incomplete)
Echocardiogram attempted twice, Patient is currently at MRI

## 2022-04-09 ENCOUNTER — Other Ambulatory Visit: Payer: Self-pay | Admitting: Neurology

## 2022-04-09 DIAGNOSIS — N185 Chronic kidney disease, stage 5: Secondary | ICD-10-CM | POA: Diagnosis not present

## 2022-04-09 DIAGNOSIS — I639 Cerebral infarction, unspecified: Secondary | ICD-10-CM

## 2022-04-09 DIAGNOSIS — D631 Anemia in chronic kidney disease: Secondary | ICD-10-CM | POA: Diagnosis not present

## 2022-04-09 DIAGNOSIS — E1136 Type 2 diabetes mellitus with diabetic cataract: Secondary | ICD-10-CM | POA: Diagnosis not present

## 2022-04-09 DIAGNOSIS — I63532 Cerebral infarction due to unspecified occlusion or stenosis of left posterior cerebral artery: Secondary | ICD-10-CM

## 2022-04-09 DIAGNOSIS — W19XXXA Unspecified fall, initial encounter: Secondary | ICD-10-CM | POA: Diagnosis not present

## 2022-04-09 DIAGNOSIS — R9089 Other abnormal findings on diagnostic imaging of central nervous system: Secondary | ICD-10-CM | POA: Diagnosis not present

## 2022-04-09 LAB — BASIC METABOLIC PANEL
Anion gap: 14 (ref 5–15)
BUN: 39 mg/dL — ABNORMAL HIGH (ref 8–23)
CO2: 16 mmol/L — ABNORMAL LOW (ref 22–32)
Calcium: 9.6 mg/dL (ref 8.9–10.3)
Chloride: 109 mmol/L (ref 98–111)
Creatinine, Ser: 7.24 mg/dL — ABNORMAL HIGH (ref 0.61–1.24)
GFR, Estimated: 7 mL/min — ABNORMAL LOW (ref >60)
Glucose, Bld: 85 mg/dL (ref 70–99)
Potassium: 3.4 mmol/L — ABNORMAL LOW (ref 3.5–5.1)
Sodium: 139 mmol/L (ref 135–145)

## 2022-04-09 MED ORDER — CLOPIDOGREL BISULFATE 75 MG PO TABS: 75.0000 mg | ORAL_TABLET | Freq: Every day | ORAL | 2 refills | Status: DC

## 2022-04-09 MED ORDER — ASPIRIN 81 MG PO TBEC: 81.0000 mg | DELAYED_RELEASE_TABLET | Freq: Every day | ORAL | 12 refills | Status: DC

## 2022-04-09 MED ORDER — POTASSIUM CHLORIDE CRYS ER 20 MEQ PO TBCR
40.0000 meq | EXTENDED_RELEASE_TABLET | Freq: Once | ORAL | Status: AC
  Administered 2022-04-09: 40 meq via ORAL
  Filled 2022-04-09: qty 2

## 2022-04-09 MED ORDER — ROSUVASTATIN CALCIUM 20 MG PO TABS: 20.0000 mg | ORAL_TABLET | Freq: Every day | ORAL | 1 refills | Status: DC

## 2022-04-09 MED ORDER — SODIUM BICARBONATE 650 MG PO TABS
1300.0000 mg | ORAL_TABLET | Freq: Two times a day (BID) | ORAL | Status: DC
  Administered 2022-04-09: 1300 mg via ORAL
  Filled 2022-04-09: qty 2

## 2022-04-09 MED ORDER — SODIUM BICARBONATE 650 MG PO TABS: 1300.0000 mg | ORAL_TABLET | Freq: Two times a day (BID) | ORAL | 1 refills | Status: DC

## 2022-04-09 NOTE — Evaluation (Signed)
Occupational Therapy Evaluation Patient Details Name: Antonio Adams MRN: [id number] DOB: 22-Feb-1946 Today's Date: 04/09/2022   History of Present Illness 76 y.o. M who presented with fall 2 days ago and neck pain since. CT head showed a hypodensity in the left occiput, neurology concerned for CVA. +right homonymous hemianopsia; MRI pending; PMH ESRD not on HD, renal cancer metastatic to liver, adrenals and lymph nodes no longer on chemo, DM and anemia   Clinical Impression   PTA, pt reports he was independent and lived alone. Upon eval, pt performing functional mobility and LB ADL at supervision level. Pt with decreased peripheral vision on the R. Pt with poor ability to follow multistep commands and requiring increased time for processing throughout eval. Pt with decreased safety during functional mobility and with poor awareness of visual changes. Recommending OP OT to optimize safety and independence in ADL and IADL.      Recommendations for follow up therapy are one component of a multi-disciplinary discharge planning process, led by the attending physician.  Recommendations may be updated based on patient status, additional functional criteria and insurance authorization.   Follow Up Recommendations  Outpatient OT     Assistance Recommended at Discharge Intermittent Supervision/Assistance  Patient can return home with the following A little help with walking and/or transfers;A little help with bathing/dressing/bathroom;Assistance with cooking/housework;Direct supervision/assist for medications management;Assist for transportation;Direct supervision/assist for financial management;Help with stairs or ramp for entrance    Functional Status Assessment  Patient has had a recent decline in their functional status and demonstrates the ability to make significant improvements in function in a reasonable and predictable amount of time.  Equipment Recommendations  BSC/3in1     Recommendations for Other Services       Precautions / Restrictions Precautions Precautions: Fall Precaution Comments: pt with recent fall in kitchen and not sure what happened (unwitnessed) Restrictions Weight Bearing Restrictions: No      Mobility Bed Mobility Overal bed mobility: Modified Independent                  Transfers Overall transfer level: Needs assistance Equipment used: Straight cane Transfers: Sit to/from Stand Sit to Stand: Min guard           General transfer comment: for safety      Balance Overall balance assessment: Needs assistance Sitting-balance support: No upper extremity supported, Feet supported Sitting balance-Leahy Scale: Good     Standing balance support: No upper extremity supported Standing balance-Leahy Scale: Fair                             ADL either performed or assessed with clinical judgement   ADL Overall ADL's : Needs assistance/impaired Eating/Feeding: Modified independent;Sitting   Grooming: Min guard;Standing   Upper Body Bathing: Set up;Sitting   Lower Body Bathing: Min guard;Sit to/from stand   Upper Body Dressing : Set up;Sitting   Lower Body Dressing: Min guard;Sit to/from stand   Toilet Transfer: Min guard (cane)   Toileting- Water quality scientist and Hygiene: Set up;Sitting/lateral lean   Tub/ Shower Transfer: Min guard;Ambulation;Tub transfer   Functional mobility during ADLs: Min guard;Supervision/safety General ADL Comments: Limited by cognition, weakness, vision, and balance.     Vision Baseline Vision/History: 1 Wears glasses Ability to See in Adequate Light: 0 Adequate Patient Visual Report: No change from baseline Vision Assessment?: Yes;Vision impaired- to be further tested in functional context Eye Alignment: Impaired (comment) (R eye with decreased alignment  to L) Ocular Range of Motion: Within Functional Limits Tracking/Visual Pursuits: Decreased smoothness of  horizontal tracking;Decreased smoothness of eye movement to RIGHT inferior field Saccades:  (Poor command following to participate) Convergence: Impaired - to be further tested in functional context Visual Fields: Right homonymous hemianopsia     Perception     Praxis      Pertinent Vitals/Pain Pain Assessment Pain Assessment: No/denies pain     Hand Dominance Right   Extremity/Trunk Assessment         Cervical / Trunk Assessment Cervical / Trunk Assessment: Kyphotic   Communication Communication Communication: No difficulties   Cognition Arousal/Alertness: Awake/alert Behavior During Therapy: WFL for tasks assessed/performed Overall Cognitive Status: Impaired/Different from baseline Area of Impairment: Orientation, Attention, Memory, Following commands, Safety/judgement, Awareness, Problem solving                 Orientation Level: Disoriented to, Time (day) Current Attention Level: Sustained Memory: Decreased short-term memory Following Commands: Follows one step commands consistently, Follows one step commands with increased time, Follows multi-step commands inconsistently Safety/Judgement: Decreased awareness of safety, Decreased awareness of deficits Awareness: Intellectual Problem Solving: Slow processing, Difficulty sequencing, Requires verbal cues General Comments: Difficulty following multistep commands. requires verbal cues and hints for orientation. Slow to process and follow commands throughout.     General Comments       Exercises     Shoulder Instructions      Home Living Family/patient expects to be discharged to:: Private residence Living Arrangements: Alone Available Help at Discharge: Other (Comment) (fance) Type of Home: House Home Access: Ramped entrance     Home Layout: One level     Bathroom Shower/Tub: Tub/shower unit         Home Equipment: Kasandra Knudsen - single point      Lives With: Significant other    Prior  Functioning/Environment Prior Level of Function : Independent/Modified Independent             Mobility Comments: cane at all times recently ADLs Comments: ind in ADL, IADL, and driving.        OT Problem List: Decreased strength;Decreased activity tolerance;Impaired balance (sitting and/or standing);Decreased cognition;Decreased safety awareness;Decreased knowledge of use of DME or AE;Pain      OT Treatment/Interventions: Self-care/ADL training;Therapeutic exercise;DME and/or AE instruction;Cognitive remediation/compensation;Patient/family education;Visual/perceptual remediation/compensation;Balance training;Therapeutic activities    OT Goals(Current goals can be found in the care plan section) Acute Rehab OT Goals OT Goal Formulation: With patient Time For Goal Achievement: 04/23/22 Potential to Achieve Goals: Good  OT Frequency: Min 2X/week    Co-evaluation              AM-PAC OT "6 Clicks" Daily Activity     Outcome Measure Help from another person eating meals?: None Help from another person taking care of personal grooming?: A Little Help from another person toileting, which includes using toliet, bedpan, or urinal?: A Little Help from another person bathing (including washing, rinsing, drying)?: A Little Help from another person to put on and taking off regular upper body clothing?: A Little Help from another person to put on and taking off regular lower body clothing?: A Little 6 Click Score: 19   End of Session Equipment Utilized During Treatment: Gait belt (Pt's personal cane) Nurse Communication: Mobility status  Activity Tolerance: Patient tolerated treatment well Patient left: in bed;with call bell/phone within reach;with bed alarm set;with family/visitor present  OT Visit Diagnosis: Unsteadiness on feet (R26.81);Muscle weakness (generalized) (M62.81);Other abnormalities of gait and mobility (  R26.89);Other symptoms and signs involving cognitive function;Low  vision, both eyes (H54.2);Pain Pain - part of body:  (neck)                Time: 5339-1792 OT Time Calculation (min): 22 min Charges:  OT General Charges $OT Visit: 1 Visit OT Evaluation $OT Eval Moderate Complexity: 1 Mod  Elder Cyphers, OTR/L [facility] Acute Rehabilitation Office: [phone]   Magnus Ivan 04/09/2022, 3:21 PM

## 2022-04-09 NOTE — Outcomes Assessment (Signed)
Patient and fiance education on importance of patient NOT driving until cleared by a physician. Both verbalized their understanding.

## 2022-04-09 NOTE — Discharge Summary (Signed)
PATIENT DETAILS Name: Antonio Adams Age: 76 y.o. Sex: male Date of Birth: 1945/10/29 MRN: [id number]. Admitting Physician: Edwin Dada, MD QMG:[id number], [name], MD  Admit Date: 04/07/2022 Discharge date: 04/09/2022  Recommendations for Outpatient Follow-up:  Follow up with PCP in 1-2 weeks Please obtain CMP/CBC in one week Ensure follow-up with stroke clinic Aspirin/Plavix x 3 months followed by aspirin alone Reassess need for loop recorder at next follow-up-patient not keen on loop recorder given underlying malignancy/severity of anemia/CKD 5  Admitted From:  Home  Disposition: Home -outpatient physical therapy.   Discharge Condition: fair  CODE STATUS:   Code Status: DNR   Diet recommendation:  Diet Order             Diet - low sodium heart healthy           Diet Carb Modified           Diet renal with fluid restriction Fluid restriction: 1200 mL Fluid; Room service appropriate? Yes; Fluid consistency: Thin  Diet effective now                    Brief Summary: Patient is a 76 y.o.  male with history of metastatic renal cell carcinoma, CKD 5, chronic normocytic anemia who presented to the ED after sustaining a fall vs syncope-CT head showed left occipital lobe hypodensity-with concern for left PCA infarct-although mass/edema from underlying malignancy was not definitely ruled out.  Patient was subsequently transferred to [facility] for further evaluation.   Significant events: 11/24>> admit to [facility] at [facility] for fall/syncope-possible acute CVA on CT head.  Transferred to [facility] for neuro/further imaging.   Significant studies: 11/24>> x-ray ribs right/chest: No fracture/no PNA. 11/24>> CT head: Cortical/subcortical density left occipital lobe. 11/24>> CT C-spine: S/p ACDF C5-C6 with disc degenerative disease at C6-C7 11/25>> LDL: 159 11/25>> A1c: Pending 11/25>> lower extremity Doppler: No DVT 11/25>> echo: EF 50-55%, no wall motion  abnormalities. 11/25>> MRI brain: Acute/subacute left PCA territory infarct 11/25>> MRA brain: Negative for LVO, positive for left PCA P3 branch block occlusion 11/25>>   Significant microbiology data: None   Procedures: None   Consults: Neurology  Brief Hospital Course: Fall versus syncope Poor historian-does not exactly remember how he found himself on the floor Telemetry negative Echo with stable EF EEG no seizures-MRI with acute CVA Orthostatic vital signs were positive on 11/25-but per patient-he was asymptomatic without any dizziness. Place TED hose Given unexplained-possible syncope-right homonymous hemianopsia-patient was advised not to drive.  He is agreeable.   Left PCA territory acute CVA Thought to be embolic Symmetrical strength in all 4 extremities but continues to have right homonymous hemianopsia Echo stable Telemetry negative for A-fib Lower extremity Dopplers negative Loop recorder was contemplated-however upon my discussion with patient this morning-he is not keen on loop recorder-as given severity of anemia/underlying malignancy/stage V CKD-he may not tolerate anticoagulation. Plan is to discharge on aspirin/Plavix x 3 months followed by aspirin alone. Issue of loop recorder can be reassessed at next follow-up visit with PCP or neurology.  Minimally elevated troponin No anginal symptoms Suspect false positive elevation in setting of CKD Echo stable with preserved EF-no wall motion abnormalities   CKD stage V Follows with Dr. Hollie Salk No volume overload-electrolytes fairly stable Apparently has deferred HD No current indications for initiation of HD or renal consultation Mild metabolic acidosis-placed on sodium bicarb tablets.   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   Weight as of this encounter: 87.1 kg.    Discharge Diagnoses:  Principal Problem:   Abnormal brain CT Active Problems:   Metastatic renal cell carcinoma (HCC)   Type 2 diabetes mellitus with diabetic cataract (HCC)   Essential hypertension   CKD (chronic kidney disease) stage 5, GFR less than 15 ml/min (HCC)   Anemia due to chronic kidney disease   Discharge Instructions:  Activity:  As tolerated with Full fall precautions use walker/cane & assistance as needed  Discharge Instructions     Ambulatory referral to Neurology   Complete by: As directed    An appointment is requested in approximately: 8 weeks   Diet - low sodium heart healthy   Complete by: As directed    Diet Carb Modified   Complete by: As directed    Discharge instructions   Complete by: As directed    Follow with Primary MD  [name], MD in 1-2 weeks  Follow-up with your oncologist and your nephrologist as previously scheduled  Electronic referral has been sent to stroke clinic/neurology office, they will call you with a follow-up appointment.  Take aspirin/Plavix for 3 months, after 3 months stop Plavix and continue on aspirin.  Please get a complete blood count and chemistry panel checked by your Primary MD at your next visit, and again as instructed by your Primary MD.  Get Medicines reviewed and adjusted: Please  take all your medications with you for your next visit with your Primary MD  Laboratory/radiological data: Please request your Primary MD to go over all hospital tests and procedure/radiological results at the follow up, please ask your Primary MD to get all Hospital records sent to his/her office.  In some cases, they will be blood work, cultures and biopsy results pending at the time of your discharge. Please request that your primary care M.D. follows up on these results.  Also Note the following: If you experience worsening of your admission symptoms, develop shortness of breath, life threatening emergency, suicidal or homicidal thoughts you must seek medical attention immediately by calling 911 or calling your MD immediately  if symptoms less severe.  You must read complete instructions/literature along with all the possible adverse reactions/side effects for all the Medicines you take and that have been prescribed to you. Take any new Medicines after you have completely understood and accpet all the possible adverse reactions/side effects.   Do not drive when taking Pain medications or sleeping medications (Benzodaizepines)  Do not take more than prescribed Pain, Sleep and Anxiety Medications. It is not advisable to combine anxiety,sleep and pain medications without talking with your primary care practitioner  Special Instructions: If you have smoked or chewed Tobacco  in the last 2 yrs please stop smoking, stop any regular Alcohol  and or any Recreational drug use.  Wear Seat  belts while driving.  Please note: You were cared for by a hospitalist during your hospital stay. Once you are discharged, your primary care physician will handle any further medical issues. Please note that NO REFILLS for any discharge medications will be authorized once you are discharged, as it is imperative that you return to your primary care physician (or establish a relationship with a primary care physician  if you do not have one) for your post hospital discharge needs so that they can reassess your need for medications and monitor your lab values.   No driving given visual changes (right homonymous hemianopsia) and unexplained/possible syncope.   Increase activity slowly   Complete by: As directed       Allergies as of 04/09/2022       Reactions   Ace Inhibitors Swelling   lisinopril   Xyzal [levocetirizine] Other (See Comments)   Break out   Opdivo [nivolumab] Rash        Medication List     STOP taking these medications    atorvastatin 20 MG tablet Commonly known as: LIPITOR       TAKE these medications    acetaminophen 500 MG tablet Commonly known as: TYLENOL Take 1,000 mg by mouth every 6 (six) hours as needed for mild pain.   aspirin EC 81 MG tablet Take 1 tablet (81 mg total) by mouth daily. Swallow whole.   B-12 PO Take 1,000 mcg by mouth daily.   calcitRIOL 0.25 MCG capsule Commonly known as: ROCALTROL Take 0.25 mcg by mouth daily.   clopidogrel 75 MG tablet Commonly known as: PLAVIX Take 1 tablet (75 mg total) by mouth daily. Take Plavix along with aspirin for 3 months, after 3 months stop Plavix and just continue on aspirin.   cyclobenzaprine 5 MG tablet Commonly known as: FLEXERIL Take 1 tablet (5 mg total) by mouth 2 (two) times daily as needed for muscle spasms.   ferrous sulfate 325 (65 FE) MG tablet Take 1 tablet (325 mg total) by mouth 2 (two) times daily. Patient needs office visit for further refills What changed:  when to take this additional instructions   FISH OIL PO Take 1,000 mg by mouth 2 (two) times a week.   fluocinonide-emollient 0.05 % cream Commonly known as: LIDEX-E Apply 1 Application topically 2 (two) times daily as needed (dry skin).   furosemide 40 MG tablet Commonly known as: LASIX TAKE 1 TABLET BY MOUTH EVERY DAY What changed: when to take this   gemfibrozil 600 MG tablet Commonly known as: LOPID Take 600 mg  by mouth 2 (two) times daily before a meal.   Lokelma 10 g Pack packet Generic drug: sodium zirconium cyclosilicate Take 10 g by mouth daily.   megestrol 40 MG/ML suspension Commonly known as: MEGACE TAKE 10 MLS (400 MG TOTAL) BY MOUTH DAILY. What changed: See the new instructions.   metoprolol tartrate 25 MG tablet Commonly known as: LOPRESSOR Take 1 tablet (25 mg total) by mouth 2 (two) times daily.   OVER THE COUNTER MEDICATION Take 1 tablet by mouth daily. Focus factor   pantoprazole 20 MG tablet Commonly known as: PROTONIX Take 1 tablet (20 mg total) by mouth daily. What changed:  when to take this reasons to take this   polyvinyl alcohol 1.4 % ophthalmic solution Commonly known as: LIQUIFILM TEARS Place 1 drop into both eyes daily as needed for dry eyes.   PREVAGEN EXTRA STRENGTH PO Take 1 tablet by mouth daily.   rosuvastatin  20 MG tablet Commonly known as: CRESTOR Take 1 tablet (20 mg total) by mouth daily.   sodium bicarbonate 650 MG tablet Take 2 tablets (1,300 mg total) by mouth 2 (two) times daily.   solifenacin 5 MG tablet Commonly known as: VESICARE TAKE 1 TABLET (5 MG TOTAL) BY MOUTH DAILY.   tamsulosin 0.4 MG Caps capsule Commonly known as: FLOMAX TAKE 1 CAPSULE (0.4 MG TOTAL) BY MOUTH DAILY.   zinc gluconate 50 MG tablet Take 1 tablet (50 mg total) by mouth daily.        Allergies  Allergen Reactions   Ace Inhibitors Swelling    lisinopril   Xyzal [Levocetirizine] Other (See Comments)    Break out   Opdivo [Nivolumab] Rash     Other Procedures/Studies: VAS [place] LOWER EXTREMITY VENOUS (DVT)  Result Date: 04/08/2022  Lower Venous DVT Study Patient Name:  Antonio Adams  Date of Exam:   04/08/2022 Medical Rec #: [id number]            Accession #:    [id number] Date of Birth: 02/07/1946           Patient Gender: M Patient Age:   76 years Exam Location:  [facility] Procedure:      VAS [place] LOWER EXTREMITY VENOUS (DVT) Referring  Phys: Cornelius Moras XU --------------------------------------------------------------------------------  Indications: Stroke.  Comparison Study: No prior study Performing Technologist: Sharion Dove RVS  Examination Guidelines: A complete evaluation includes B-mode imaging, spectral Doppler, color Doppler, and power Doppler as needed of all accessible portions of each vessel. Bilateral testing is considered an integral part of a complete examination. Limited examinations for reoccurring indications may be performed as noted. The reflux portion of the exam is performed with the patient in reverse Trendelenburg.  +---------+---------------+---------+-----------+----------+--------------+ RIGHT    CompressibilityPhasicitySpontaneityPropertiesThrombus Aging +---------+---------------+---------+-----------+----------+--------------+ CFV      Full           Yes      Yes                                 +---------+---------------+---------+-----------+----------+--------------+ SFJ      Full                                                        +---------+---------------+---------+-----------+----------+--------------+ FV Prox  Full                                                        +---------+---------------+---------+-----------+----------+--------------+ FV Mid   Full                                                        +---------+---------------+---------+-----------+----------+--------------+ FV DistalFull                                                        +---------+---------------+---------+-----------+----------+--------------+  PFV      Full                                                        +---------+---------------+---------+-----------+----------+--------------+ POP      Full           Yes      Yes                                 +---------+---------------+---------+-----------+----------+--------------+ PTV      Full                                                         +---------+---------------+---------+-----------+----------+--------------+ PERO     Full                                                        +---------+---------------+---------+-----------+----------+--------------+   +---------+---------------+---------+-----------+----------+--------------+ LEFT     CompressibilityPhasicitySpontaneityPropertiesThrombus Aging +---------+---------------+---------+-----------+----------+--------------+ CFV      Full           Yes      Yes                                 +---------+---------------+---------+-----------+----------+--------------+ SFJ      Full                                                        +---------+---------------+---------+-----------+----------+--------------+ FV Prox  Full                                                        +---------+---------------+---------+-----------+----------+--------------+ FV Mid   Full                                                        +---------+---------------+---------+-----------+----------+--------------+ FV DistalFull                                                        +---------+---------------+---------+-----------+----------+--------------+ PFV      Full                                                        +---------+---------------+---------+-----------+----------+--------------+  POP      Full           Yes      Yes                                 +---------+---------------+---------+-----------+----------+--------------+ PTV      Full                                                        +---------+---------------+---------+-----------+----------+--------------+ PERO     Full                                                        +---------+---------------+---------+-----------+----------+--------------+     Summary: BILATERAL: - No evidence of deep vein thrombosis seen in the lower  extremities, bilaterally. -No evidence of popliteal cyst, bilaterally.   *See table(s) above for measurements and observations. Electronically signed by Harold Barban MD on 04/08/2022 at 8:49:42 PM.    Final    VAS US CAROTID  Result Date: 04/08/2022 Carotid Arterial Duplex Study Patient Name:  Antonio Adams  Date of Exam:   04/08/2022 Medical Rec #: [id number]            Accession #:    [id number] Date of Birth: 09/15/1945           Patient Gender: M Patient Age:   71 years Exam Location:  [facility] Procedure:      VAS US CAROTID Referring Phys: Cornelius Moras XU --------------------------------------------------------------------------------  Indications:       Progressive memory loss, gait disturbance, question CVA. Risk Factors:      Diabetes. Limitations        Today's exam was limited due to the high bifurcation of the                    carotid. Comparison Study:  Prior normal Carotid duplex done 10/14/14 Performing Technologist: Sharion Dove RVS  Examination Guidelines: A complete evaluation includes B-mode imaging, spectral Doppler, color Doppler, and power Doppler as needed of all accessible portions of each vessel. Bilateral testing is considered an integral part of a complete examination. Limited examinations for reoccurring indications may be performed as noted.  Right Carotid Findings: +----------+--------+--------+--------+------------------+--------+           PSV cm/sEDV cm/sStenosisPlaque DescriptionComments +----------+--------+--------+--------+------------------+--------+ CCA Prox  94      18              heterogenous               +----------+--------+--------+--------+------------------+--------+ CCA Distal65      19              homogeneous                +----------+--------+--------+--------+------------------+--------+ ICA Prox  43      14              heterogenous               +----------+--------+--------+--------+------------------+--------+  ICA Mid   49      14                                         +----------+--------+--------+--------+------------------+--------+  ICA Distal44      12                                         +----------+--------+--------+--------+------------------+--------+ ECA       54      9                                          +----------+--------+--------+--------+------------------+--------+ +----------+--------+-------+--------+-------------------+           PSV cm/sEDV cmsDescribeArm Pressure (mmHG) +----------+--------+-------+--------+-------------------+ Subclavian116                                        +----------+--------+-------+--------+-------------------+ +---------+--------+--+--------+-+ VertebralPSV cm/s31EDV cm/s9 +---------+--------+--+--------+-+  Left Carotid Findings: +----------+--------+--------+--------+------------------+------------------+           PSV cm/sEDV cm/sStenosisPlaque DescriptionComments           +----------+--------+--------+--------+------------------+------------------+ CCA Prox  109     19                                intimal thickening +----------+--------+--------+--------+------------------+------------------+ CCA Distal86      18                                intimal thickening +----------+--------+--------+--------+------------------+------------------+ ICA Prox  46      12              heterogenous                         +----------+--------+--------+--------+------------------+------------------+ ICA Mid   48      14                                                   +----------+--------+--------+--------+------------------+------------------+ ICA Distal51      15                                                   +----------+--------+--------+--------+------------------+------------------+ ECA       73      18                                                    +----------+--------+--------+--------+------------------+------------------+ +----------+--------+--------+--------+-------------------+           PSV cm/sEDV cm/sDescribeArm Pressure (mmHG) +----------+--------+--------+--------+-------------------+ [id number]                                          +----------+--------+--------+--------+-------------------+ +---------+--------+--+--------+--+ VertebralPSV cm/s71EDV cm/s20 +---------+--------+--+--------+--+   Summary: Right Carotid: The extracranial vessels were near-normal with only minimal wall  thickening or plaque. Left Carotid: The extracranial vessels were near-normal with only minimal wall               thickening or plaque. Vertebrals:  Bilateral vertebral arteries demonstrate antegrade flow. Subclavians: Normal flow hemodynamics were seen in bilateral subclavian              arteries. *See table(s) above for measurements and observations.  Electronically signed by Harold Barban MD on 04/08/2022 at 8:49:22 PM.    Final    EEG adult  Result Date: 04/08/2022 Derek Jack, MD     04/08/2022  6:46 PM Routine EEG Report NAFTULA DONAHUE is a 76 y.o. male with a history of syncope who is undergoing an EEG to evaluate for seizures. Report: This EEG was acquired with electrodes placed according to the International 10-20 electrode system (including Fp1, Fp2, F3, F4, C3, C4, P3, P4, O1, O2, T3, T4, T5, T6, A1, A2, Fz, Cz, Pz). The following electrodes were missing or displaced: none. The occipital dominant rhythm was 8.5 Hz. This activity is reactive to stimulation. Drowsiness was manifested by background fragmentation; deeper stages of sleep were identified by K complexes and sleep spindles. There was no focal slowing. There were no interictal epileptiform discharges. There were no electrographic seizures identified. Photic stimulation and hyperventilation were not performed. Impression: This EEG was obtained while  awake and asleep and is normal.   Clinical Correlation: Normal EEGs, however, do not rule out epilepsy. Su Monks, MD [facility] [phone] If 7pm- 7am, please page neurology on call as listed in [place].   ECHOCARDIOGRAM COMPLETE  Result Date: 04/08/2022    ECHOCARDIOGRAM REPORT   Patient Name:   Antonio Adams Date of Exam: 04/08/2022 Medical Rec #:  [id number]           Height:       71.0 in Accession #:    [id number]          Weight:       192.0 lb Date of Birth:  [birth year]          BSA:          2.072 m Patient Age:    18 years            BP:           167/87 mmHg Patient Gender: M                   HR:           69 bpm. Exam Location:  Inpatient Procedure: 2D Echo Indications:    stroke  History:        Patient has prior history of Echocardiogram examinations, most                 recent 01/07/2022. Signs/Symptoms:Murmur; Risk Factors:Diabetes                 and Hypertension.  Sonographer:    Harvie Junior Referring Phys: [id number] [facility] XU  Sonographer Comments: Technically difficult study due to poor echo windows and no subcostal window. IMPRESSIONS  1. Left ventricular ejection fraction, by estimation, is 50 to 55%. The left ventricle has normal function. The left ventricle has no regional wall motion abnormalities. There is mild concentric left ventricular hypertrophy. Left ventricular diastolic parameters are consistent with Grade I diastolic dysfunction (impaired relaxation).  2. Right ventricular systolic function is normal. The right ventricular size is normal. Tricuspid regurgitation signal is inadequate for  assessing PA pressure.  3. The mitral valve is normal in structure. No evidence of mitral valve regurgitation.  4. Aortic valve gradients and the degree of aortic stenosis may be underestimated due to poor Doppler jet alignment. Morphologically, expect there is mild-moderate aortic stenosis. The aortic valve is tricuspid. There is mild calcification of the aortic  valve.  There is moderate thickening of the aortic valve. Aortic valve regurgitation is not visualized. Aortic valve sclerosis/calcification is present, without any evidence of aortic stenosis. FINDINGS  Left Ventricle: Left ventricular ejection fraction, by estimation, is 50 to 55%. The left ventricle has normal function. The left ventricle has no regional wall motion abnormalities. The left ventricular internal cavity size was normal in size. There is  mild concentric left ventricular hypertrophy. Left ventricular diastolic parameters are consistent with Grade I diastolic dysfunction (impaired relaxation). Normal left ventricular filling pressure. Right Ventricle: The right ventricular size is normal. No increase in right ventricular wall thickness. Right ventricular systolic function is normal. Tricuspid regurgitation signal is inadequate for assessing PA pressure. Left Atrium: Left atrial size was normal in size. Right Atrium: Right atrial size was normal in size. Pericardium: There is no evidence of pericardial effusion. Mitral Valve: The mitral valve is normal in structure. No evidence of mitral valve regurgitation. Tricuspid Valve: The tricuspid valve is normal in structure. Tricuspid valve regurgitation is not demonstrated. Aortic Valve: Aortic valve gradients and the degree of aortic stenosis may be underestimated due to poor Doppler jet alignment. Morphologically, expect there is mild-moderate aortic stenosis. The aortic valve is tricuspid. There is mild calcification of the aortic valve. There is moderate thickening of the aortic valve. Aortic valve regurgitation is not visualized. Aortic valve sclerosis/calcification is present, without any evidence of aortic stenosis. Aortic valve mean gradient measures 5.0 mmHg. Aortic valve peak gradient measures 9.4 mmHg. Aortic valve area, by VTI measures 2.38 cm. Pulmonic Valve: The pulmonic valve was not well visualized. Aorta: The aortic root is normal in size and  structure. IAS/Shunts: The interatrial septum was not well visualized.  LEFT VENTRICLE PLAX 2D LVIDd:         5.10 cm      Diastology LVIDs:         3.80 cm      LV e' medial:    4.46 cm/s LV PW:         1.20 cm      LV E/e' medial:  7.8 LV IVS:        1.20 cm      LV e' lateral:   4.35 cm/s LVOT diam:     2.20 cm      LV E/e' lateral: 8.0 LV SV:         72 LV SV Index:   35 LVOT Area:     3.80 cm  LV Volumes (MOD) LV vol d, MOD A2C: 96.3 ml LV vol d, MOD A4C: 119.0 ml LV vol s, MOD A2C: 55.0 ml LV vol s, MOD A4C: 45.5 ml LV SV MOD A2C:     41.3 ml LV SV MOD A4C:     119.0 ml LV SV MOD BP:      58.4 ml RIGHT VENTRICLE RV Basal diam:  3.30 cm RV Mid diam:    3.00 cm RV S prime:     12.60 cm/s TAPSE (M-mode): 1.8 cm LEFT ATRIUM           Index        RIGHT ATRIUM  Index LA diam:      3.20 cm 1.54 cm/m   RA Area:     11.20 cm LA Vol (A4C): 42.1 ml 20.31 ml/m  RA Volume:   20.30 ml  9.80 ml/m  AORTIC VALVE                     PULMONIC VALVE AV Area (Vmax):    2.42 cm      PV Vmax:       0.46 m/s AV Area (Vmean):   2.38 cm      PV Peak grad:  0.9 mmHg AV Area (VTI):     2.38 cm AV Vmax:           153.00 cm/s AV Vmean:          103.000 cm/s AV VTI:            0.303 m AV Peak Grad:      9.4 mmHg AV Mean Grad:      5.0 mmHg LVOT Vmax:         97.50 cm/s LVOT Vmean:        64.500 cm/s LVOT VTI:          0.190 m LVOT/AV VTI ratio: 0.63  AORTA Ao Root diam: 3.60 cm MITRAL VALVE MV Area (PHT): 3.37 cm    SHUNTS MV Decel Time: 225 msec    Systemic VTI:  0.19 m MV E velocity: 34.60 cm/s  Systemic Diam: 2.20 cm MV A velocity: 90.10 cm/s MV E/A ratio:  0.38 Mihai Croitoru MD Electronically signed by Sanda Klein MD Signature Date/Time: 04/08/2022/4:40:27 PM    Final    MR BRAIN WO CONTRAST  Result Date: 04/08/2022 CLINICAL DATA:  76 year old male with neurologic deficit. Age indeterminate left PCA territory infarct on head CT yesterday. EXAM: MRI HEAD WITHOUT CONTRAST MRA HEAD WITHOUT CONTRAST TECHNIQUE:  Multiplanar, multi-echo pulse sequences of the brain and surrounding structures were acquired without intravenous contrast. Angiographic images of the intracranial vasculature using MRA technique without intravenous contrast. COMPARISON:  Head CT yesterday.  Brain MRI 09/13/2020. FINDINGS: MRI HEAD WITHOUT CONTRAST Brain: Restricted diffusion in the left PCA territory including the left thalamus, medial left occipital lobe, along the calcarine sulcus, left parietooccipital sulcus. Associated T2 and FLAIR hyperintense cytotoxic edema, T1 hypointensity, with questionable mild petechial hemorrhage. Mild mass effect on the left occipital horn. Superimposed trace restricted diffusion also in the right occipital lobe on series 5, image 70. No posterior fossa or anterior circulation diffusion restriction identified. No midline shift, mass effect, evidence of mass lesion, ventriculomegaly, extra-axial collection. Cervicomedullary junction and pituitary are within normal limits. Mild chronic microhemorrhage along the right parieto-occipital sulcus and in the right occipital pole seems new since 2022. No cortical encephalomalacia identified. Vascular: Major intracranial vascular flow voids are stable since last year, see MRA findings below. Skull and upper cervical spine: Negative for age visible cervical spine. Visualized bone marrow signal is within normal limits. Sinuses/Orbits: Negative. Other: Mastoids remain clear. Grossly normal visible internal auditory structures. MRA Antegrade flow in the posterior circulation with dominant distal vertebral artery. Both PICA origins and distal vertebral arteries are patent. Patent basilar artery. No distal vertebral or basilar stenosis. Patent SCA and PCA origins. Posterior communicating arteries are diminutive or absent. Left PCA P1 and P2 segments are patent. P3 bifurcation is patent. Probable occlusion of the superior left P3 division. Contralateral right PCA branches appear  patent and within normal limits. Antegrade flow in both ICA  siphons with tortuous ICAs just below the skull base. Right greater than left siphon irregularity, cannot exclude hemodynamically significant stenosis in the right petrous and cavernous segments. Left ICA siphon irregularity without significant stenosis. Patent carotid termini. Left ACA A1 is dominant, right is diminutive or absent. Anterior communicating artery and visible ACA branches are within normal limits. MCA M1 segments and bi/trifurcations are patent without stenosis. Visible MCA branches are within normal limits. IMPRESSION: 1. 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 Also punctate contralateral right PCA infarct. Possible mild petechial hemorrhage. But no malignant hemorrhagic transformation or significant intracranial mass effect. 2. Intracranial MRA is negative for large vessel occlusion. But positive for evidence of Left PCA P3 branch occlusion, and ICA siphon atherosclerosis with possible tandem significant stenoses of the Right siphon. Electronically Signed   By: [name] M.D.   On: 04/08/2022 12:29   MR ANGIO HEAD WO CONTRAST  Result Date: 04/08/2022 CLINICAL DATA:  76 year old male with neurologic deficit. Age indeterminate left PCA territory infarct on head CT yesterday. EXAM: MRI HEAD WITHOUT CONTRAST MRA HEAD WITHOUT CONTRAST TECHNIQUE: Multiplanar, multi-echo pulse sequences of the brain and surrounding structures were acquired without intravenous contrast. Angiographic images of the intracranial vasculature using MRA technique without intravenous contrast. COMPARISON:  Head CT yesterday.  Brain MRI 09/13/2020. FINDINGS: MRI HEAD WITHOUT CONTRAST Brain: Restricted diffusion in the left PCA territory including the left thalamus, medial left occipital lobe, along the calcarine sulcus, left parietooccipital sulcus. Associated T2 and FLAIR hyperintense cytotoxic edema, T1 hypointensity, with  questionable mild petechial hemorrhage. Mild mass effect on the left occipital horn. Superimposed trace restricted diffusion also in the right occipital lobe on series 5, image 70. No posterior fossa or anterior circulation diffusion restriction identified. No midline shift, mass effect, evidence of mass lesion, ventriculomegaly, extra-axial collection. Cervicomedullary junction and pituitary are within normal limits. Mild chronic microhemorrhage along the right parieto-occipital sulcus and in the right occipital pole seems new since 2022. No cortical encephalomalacia identified. Vascular: Major intracranial vascular flow voids are stable since last year, see MRA findings below. Skull and upper cervical spine: Negative for age visible cervical spine. Visualized bone marrow signal is within normal limits. Sinuses/Orbits: Negative. Other: Mastoids remain clear. Grossly normal visible internal auditory structures. MRA Antegrade flow in the posterior circulation with dominant distal vertebral artery. Both PICA origins and distal vertebral arteries are patent. Patent basilar artery. No distal vertebral or basilar stenosis. Patent SCA and PCA origins. Posterior communicating arteries are diminutive or absent. Left PCA P1 and P2 segments are patent. P3 bifurcation is patent. Probable occlusion of the superior left P3 division. Contralateral right PCA branches appear patent and within normal limits. Antegrade flow in both ICA siphons with tortuous ICAs just below the skull base. Right greater than left siphon irregularity, cannot exclude hemodynamically significant stenosis in the right petrous and cavernous segments. Left ICA siphon irregularity without significant stenosis. Patent carotid termini. Left ACA A1 is dominant, right is diminutive or absent. Anterior communicating artery and visible ACA branches are within normal limits. MCA M1 segments and bi/trifurcations are patent without stenosis. Visible MCA branches are  within normal limits. IMPRESSION: 1. 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 Also punctate contralateral right PCA infarct. Possible mild petechial hemorrhage. But no malignant hemorrhagic transformation or significant intracranial mass effect. 2. Intracranial MRA is negative for large vessel occlusion. But positive for evidence of Left PCA P3 branch occlusion, and ICA siphon atherosclerosis with  possible tandem significant stenoses of the Right siphon. Electronically Signed   By: [name] M.D.   On: 04/08/2022 12:29   DG Abd 2 Views  Result Date: 04/08/2022 CLINICAL DATA:  Abdominal pain. EXAM: ABDOMEN - 2 VIEW COMPARISON:  Abdomen pelvis CT 10/04/2021 FINDINGS: Mild gaseous distention of small bowel and colon is seen in the abdomen and pelvis without overtly obstructive pattern. Surgical clips in the right upper quadrant suggest prior cholecystectomy. Brachytherapy seeds overlie the expected location of the prostate gland. Degenerative changes noted lumbar spine. IMPRESSION: Mild gaseous distention of small bowel and colon without overtly obstructive pattern. Enteritis or ileus would be a consideration. Electronically Signed   By: Misty Stanley M.D.   On: 04/08/2022 11:52   CT Head Wo Contrast  Result Date: 04/07/2022 CLINICAL DATA:  Fall Wednesday, neck pain EXAM: CT HEAD WITHOUT CONTRAST CT CERVICAL SPINE WITHOUT CONTRAST TECHNIQUE: Multidetector CT imaging of the head and cervical spine was performed following the standard protocol without intravenous contrast. Multiplanar CT image reconstructions of the cervical spine were also generated. RADIATION DOSE REDUCTION: This exam was performed according to the departmental dose-optimization program which includes automated exposure control, adjustment of the mA and/or kV according to patient size and/or use of iterative reconstruction technique. COMPARISON:  09/13/2020 FINDINGS: CT HEAD FINDINGS Brain: No evidence  of hemorrhage, hydrocephalus, extra-axial collection or mass lesion/mass effect. New cortical and subcortical hypodensity of the medial left occipital lobe (series 3, image 16). Mild periventricular and deep white matter hypodensity. Vascular: No hyperdense vessel or unexpected calcification. Skull: Normal. Negative for fracture or focal lesion. Sinuses/Orbits: No acute finding. Other: None. CT CERVICAL SPINE FINDINGS Alignment: Normal. Skull base and vertebrae: No acute fracture. No primary bone lesion or focal pathologic process. Soft tissues and spinal canal: No prevertebral fluid or swelling. No visible canal hematoma. Disc levels: Status post anterior cervical discectomy and fusion of C5-C6 with focally severe disc degenerative disease of C6-C7. Otherwise mild multilevel disc space height loss and osteophytosis. Upper chest: Negative. Other: None. IMPRESSION: 1. New cortical and subcortical hypodensity of the medial left occipital lobe, age indeterminate, suggestive of acute to subacute infarction or alternately edema. Consider MRI to further evaluate. 2. No fracture or static subluxation of the cervical spine. 3. Status post anterior cervical discectomy and fusion of C5-C6 with focally severe disc degenerative disease of C6-C7. Electronically Signed   By: Delanna Ahmadi M.D.   On: 04/07/2022 14:54   CT Cervical Spine Wo Contrast  Result Date: 04/07/2022 CLINICAL DATA:  Fall Wednesday, neck pain EXAM: CT HEAD WITHOUT CONTRAST CT CERVICAL SPINE WITHOUT CONTRAST TECHNIQUE: Multidetector CT imaging of the head and cervical spine was performed following the standard protocol without intravenous contrast. Multiplanar CT image reconstructions of the cervical spine were also generated. RADIATION DOSE REDUCTION: This exam was performed according to the departmental dose-optimization program which includes automated exposure control, adjustment of the mA and/or kV according to patient size and/or use of iterative  reconstruction technique. COMPARISON:  09/13/2020 FINDINGS: CT HEAD FINDINGS Brain: No evidence of hemorrhage, hydrocephalus, extra-axial collection or mass lesion/mass effect. New cortical and subcortical hypodensity of the medial left occipital lobe (series 3, image 16). Mild periventricular and deep white matter hypodensity. Vascular: No hyperdense vessel or unexpected calcification. Skull: Normal. Negative for fracture or focal lesion. Sinuses/Orbits: No acute finding. Other: None. CT CERVICAL SPINE FINDINGS Alignment: Normal. Skull base and vertebrae: No acute fracture. No primary bone lesion or focal pathologic process. Soft tissues and spinal  canal: No prevertebral fluid or swelling. No visible canal hematoma. Disc levels: Status post anterior cervical discectomy and fusion of C5-C6 with focally severe disc degenerative disease of C6-C7. Otherwise mild multilevel disc space height loss and osteophytosis. Upper chest: Negative. Other: None. IMPRESSION: 1. New cortical and subcortical hypodensity of the medial left occipital lobe, age indeterminate, suggestive of acute to subacute infarction or alternately edema. Consider MRI to further evaluate. 2. No fracture or static subluxation of the cervical spine. 3. Status post anterior cervical discectomy and fusion of C5-C6 with focally severe disc degenerative disease of C6-C7. Electronically Signed   By: Delanna Ahmadi M.D.   On: 04/07/2022 14:54   DG Ribs Unilateral W/Chest Right  Result Date: 04/07/2022 CLINICAL DATA:  RIGHT chest and rib pain following fall. Initial encounter. EXAM: RIGHT RIBS AND CHEST - 3+ VIEW COMPARISON:  01/07/2022 chest radiograph and prior studies FINDINGS: Cardiomegaly again noted. There is no evidence of focal airspace disease, pulmonary edema, suspicious pulmonary nodule/mass, pleural effusion, or pneumothorax. No acute bony abnormalities are identified. A remote RIGHT 2nd rib fracture is again noted. IMPRESSION: 1. No evidence of  acute abnormality. No evidence of acute rib fracture. 2. Cardiomegaly. Electronically Signed   By: Margarette Canada M.D.   On: 04/07/2022 14:35     TODAY-DAY OF DISCHARGE:  Subjective:   Antonio Adams today has no headache,no chest abdominal pain,no new weakness tingling or numbness, feels much better wants to go home today.   Objective:   Blood pressure (!) 153/87, pulse 75, temperature 98.4 F (36.9 C), temperature source Oral, resp. rate 16, height '5\' 11"'$  (1.803 m), weight 87.1 kg, SpO2 100 %.  Intake/Output Summary (Last 24 hours) at 04/09/2022 0859 Last data filed at 04/08/2022 1805 Gross per 24 hour  Intake 880 ml  Output --  Net 880 ml   Filed Weights   04/08/22 0110  Weight: 87.1 kg    Exam: Awake Alert, Oriented *3, No new F.N deficits, Normal affect [place].AT,PERRAL Supple Neck,No JVD, No cervical lymphadenopathy appriciated.  Symmetrical Chest wall movement, Good air movement bilaterally, CTAB RRR,No Gallops,Rubs or new Murmurs, No Parasternal Heave +ve B.Sounds, Abd Soft, Non tender, No organomegaly appriciated, No rebound -guarding or rigidity. No Cyanosis, Clubbing or edema, No new Rash or bruise   PERTINENT RADIOLOGIC STUDIES: VAS [place] LOWER EXTREMITY VENOUS (DVT)  Result Date: 04/08/2022  Lower Venous DVT Study Patient Name:  Antonio Adams  Date of Exam:   04/08/2022 Medical Rec #: [id number]            Accession #:    [id number] Date of Birth: 02-13-1946           Patient Gender: M Patient Age:   58 years Exam Location:  [facility] Procedure:      VAS [place] LOWER EXTREMITY VENOUS (DVT) Referring Phys: Cornelius Moras XU --------------------------------------------------------------------------------  Indications: Stroke.  Comparison Study: No prior study Performing Technologist: Sharion Dove RVS  Examination Guidelines: A complete evaluation includes B-mode imaging, spectral Doppler, color Doppler, and power Doppler as needed of all accessible portions of each  vessel. Bilateral testing is considered an integral part of a complete examination. Limited examinations for reoccurring indications may be performed as noted. The reflux portion of the exam is performed with the patient in reverse Trendelenburg.  +---------+---------------+---------+-----------+----------+--------------+ RIGHT    CompressibilityPhasicitySpontaneityPropertiesThrombus Aging +---------+---------------+---------+-----------+----------+--------------+ CFV      Full           Yes      Yes                                 +---------+---------------+---------+-----------+----------+--------------+  SFJ      Full                                                        +---------+---------------+---------+-----------+----------+--------------+ FV Prox  Full                                                        +---------+---------------+---------+-----------+----------+--------------+ FV Mid   Full                                                        +---------+---------------+---------+-----------+----------+--------------+ FV DistalFull                                                        +---------+---------------+---------+-----------+----------+--------------+ PFV      Full                                                        +---------+---------------+---------+-----------+----------+--------------+ POP      Full           Yes      Yes                                 +---------+---------------+---------+-----------+----------+--------------+ PTV      Full                                                        +---------+---------------+---------+-----------+----------+--------------+ PERO     Full                                                        +---------+---------------+---------+-----------+----------+--------------+   +---------+---------------+---------+-----------+----------+--------------+ LEFT      CompressibilityPhasicitySpontaneityPropertiesThrombus Aging +---------+---------------+---------+-----------+----------+--------------+ CFV      Full           Yes      Yes                                 +---------+---------------+---------+-----------+----------+--------------+ SFJ      Full                                                        +---------+---------------+---------+-----------+----------+--------------+  FV Prox  Full                                                        +---------+---------------+---------+-----------+----------+--------------+ FV Mid   Full                                                        +---------+---------------+---------+-----------+----------+--------------+ FV DistalFull                                                        +---------+---------------+---------+-----------+----------+--------------+ PFV      Full                                                        +---------+---------------+---------+-----------+----------+--------------+ POP      Full           Yes      Yes                                 +---------+---------------+---------+-----------+----------+--------------+ PTV      Full                                                        +---------+---------------+---------+-----------+----------+--------------+ PERO     Full                                                        +---------+---------------+---------+-----------+----------+--------------+     Summary: BILATERAL: - No evidence of deep vein thrombosis seen in the lower extremities, bilaterally. -No evidence of popliteal cyst, bilaterally.   *See table(s) above for measurements and observations. Electronically signed by Harold Barban MD on 04/08/2022 at 8:49:42 PM.    Final    VAS US CAROTID  Result Date: 04/08/2022 Carotid Arterial Duplex Study Patient Name:  Antonio Adams  Date of Exam:   04/08/2022 Medical  Rec #: [id number]            Accession #:    [id number] Date of Birth: 21-Feb-1946           Patient Gender: M Patient Age:   6 years Exam Location:  [facility] Procedure:      VAS US CAROTID Referring Phys: Cornelius Moras XU --------------------------------------------------------------------------------  Indications:       Progressive memory loss, gait disturbance, question CVA. Risk Factors:      Diabetes. Limitations        Today's exam was limited  due to the high bifurcation of the                    carotid. Comparison Study:  Prior normal Carotid duplex done 10/14/14 Performing Technologist: Sharion Dove RVS  Examination Guidelines: A complete evaluation includes B-mode imaging, spectral Doppler, color Doppler, and power Doppler as needed of all accessible portions of each vessel. Bilateral testing is considered an integral part of a complete examination. Limited examinations for reoccurring indications may be performed as noted.  Right Carotid Findings: +----------+--------+--------+--------+------------------+--------+           PSV cm/sEDV cm/sStenosisPlaque DescriptionComments +----------+--------+--------+--------+------------------+--------+ CCA Prox  94      18              heterogenous               +----------+--------+--------+--------+------------------+--------+ CCA Distal65      19              homogeneous                +----------+--------+--------+--------+------------------+--------+ ICA Prox  43      14              heterogenous               +----------+--------+--------+--------+------------------+--------+ ICA Mid   49      14                                         +----------+--------+--------+--------+------------------+--------+ ICA Distal44      12                                         +----------+--------+--------+--------+------------------+--------+ ECA       54      9                                           +----------+--------+--------+--------+------------------+--------+ +----------+--------+-------+--------+-------------------+           PSV cm/sEDV cmsDescribeArm Pressure (mmHG) +----------+--------+-------+--------+-------------------+ Subclavian116                                        +----------+--------+-------+--------+-------------------+ +---------+--------+--+--------+-+ VertebralPSV cm/s31EDV cm/s9 +---------+--------+--+--------+-+  Left Carotid Findings: +----------+--------+--------+--------+------------------+------------------+           PSV cm/sEDV cm/sStenosisPlaque DescriptionComments           +----------+--------+--------+--------+------------------+------------------+ CCA Prox  109     19                                intimal thickening +----------+--------+--------+--------+------------------+------------------+ CCA Distal86      18                                intimal thickening +----------+--------+--------+--------+------------------+------------------+ ICA Prox  46      12              heterogenous                         +----------+--------+--------+--------+------------------+------------------+  ICA Mid   48      14                                                   +----------+--------+--------+--------+------------------+------------------+ ICA Distal51      15                                                   +----------+--------+--------+--------+------------------+------------------+ ECA       73      18                                                   +----------+--------+--------+--------+------------------+------------------+ +----------+--------+--------+--------+-------------------+           PSV cm/sEDV cm/sDescribeArm Pressure (mmHG) +----------+--------+--------+--------+-------------------+ [id number]                                           +----------+--------+--------+--------+-------------------+ +---------+--------+--+--------+--+ VertebralPSV cm/s71EDV cm/s20 +---------+--------+--+--------+--+   Summary: Right Carotid: The extracranial vessels were near-normal with only minimal wall                thickening or plaque. Left Carotid: The extracranial vessels were near-normal with only minimal wall               thickening or plaque. Vertebrals:  Bilateral vertebral arteries demonstrate antegrade flow. Subclavians: Normal flow hemodynamics were seen in bilateral subclavian              arteries. *See table(s) above for measurements and observations.  Electronically signed by Harold Barban MD on 04/08/2022 at 8:49:22 PM.    Final    EEG adult  Result Date: 04/08/2022 Derek Jack, MD     04/08/2022  6:46 PM Routine EEG Report Antonio Adams is a 76 y.o. male with a history of syncope who is undergoing an EEG to evaluate for seizures. Report: This EEG was acquired with electrodes placed according to the International 10-20 electrode system (including Fp1, Fp2, F3, F4, C3, C4, P3, P4, O1, O2, T3, T4, T5, T6, A1, A2, Fz, Cz, Pz). The following electrodes were missing or displaced: none. The occipital dominant rhythm was 8.5 Hz. This activity is reactive to stimulation. Drowsiness was manifested by background fragmentation; deeper stages of sleep were identified by K complexes and sleep spindles. There was no focal slowing. There were no interictal epileptiform discharges. There were no electrographic seizures identified. Photic stimulation and hyperventilation were not performed. Impression: This EEG was obtained while awake and asleep and is normal.   Clinical Correlation: Normal EEGs, however, do not rule out epilepsy. Su Monks, MD [facility] [phone] If 7pm- 7am, please page neurology on call as listed in [place].   ECHOCARDIOGRAM COMPLETE  Result Date: 04/08/2022    ECHOCARDIOGRAM REPORT   Patient Name:    Antonio Adams Date of Exam: 04/08/2022 Medical Rec #:  [id number]           Height:  71.0 in Accession #:    [id number]          Weight:       192.0 lb Date of Birth:  1945-08-28          BSA:          2.072 m Patient Age:    37 years            BP:           167/87 mmHg Patient Gender: M                   HR:           69 bpm. Exam Location:  Inpatient Procedure: 2D Echo Indications:    stroke  History:        Patient has prior history of Echocardiogram examinations, most                 recent 01/07/2022. Signs/Symptoms:Murmur; Risk Factors:Diabetes                 and Hypertension.  Sonographer:    Harvie Junior Referring Phys: [id number] [facility]  Sonographer Comments: Technically difficult study due to poor echo windows and no subcostal window. IMPRESSIONS  1. Left ventricular ejection fraction, by estimation, is 50 to 55%. The left ventricle has normal function. The left ventricle has no regional wall motion abnormalities. There is mild concentric left ventricular hypertrophy. Left ventricular diastolic parameters are consistent with Grade I diastolic dysfunction (impaired relaxation).  2. Right ventricular systolic function is normal. The right ventricular size is normal. Tricuspid regurgitation signal is inadequate for assessing PA pressure.  3. The mitral valve is normal in structure. No evidence of mitral valve regurgitation.  4. Aortic valve gradients and the degree of aortic stenosis may be underestimated due to poor Doppler jet alignment. Morphologically, expect there is mild-moderate aortic stenosis. The aortic valve is tricuspid. There is mild calcification of the aortic  valve. There is moderate thickening of the aortic valve. Aortic valve regurgitation is not visualized. Aortic valve sclerosis/calcification is present, without any evidence of aortic stenosis. FINDINGS  Left Ventricle: Left ventricular ejection fraction, by estimation, is 50 to 55%. The left ventricle has normal function.  The left ventricle has no regional wall motion abnormalities. The left ventricular internal cavity size was normal in size. There is  mild concentric left ventricular hypertrophy. Left ventricular diastolic parameters are consistent with Grade I diastolic dysfunction (impaired relaxation). Normal left ventricular filling pressure. Right Ventricle: The right ventricular size is normal. No increase in right ventricular wall thickness. Right ventricular systolic function is normal. Tricuspid regurgitation signal is inadequate for assessing PA pressure. Left Atrium: Left atrial size was normal in size. Right Atrium: Right atrial size was normal in size. Pericardium: There is no evidence of pericardial effusion. Mitral Valve: The mitral valve is normal in structure. No evidence of mitral valve regurgitation. Tricuspid Valve: The tricuspid valve is normal in structure. Tricuspid valve regurgitation is not demonstrated. Aortic Valve: Aortic valve gradients and the degree of aortic stenosis may be underestimated due to poor Doppler jet alignment. Morphologically, expect there is mild-moderate aortic stenosis. The aortic valve is tricuspid. There is mild calcification of the aortic valve. There is moderate thickening of the aortic valve. Aortic valve regurgitation is not visualized. Aortic valve sclerosis/calcification is present, without any evidence of aortic stenosis. Aortic valve mean gradient measures 5.0 mmHg. Aortic valve peak gradient measures 9.4 mmHg. Aortic valve area, by VTI  measures 2.38 cm. Pulmonic Valve: The pulmonic valve was not well visualized. Aorta: The aortic root is normal in size and structure. IAS/Shunts: The interatrial septum was not well visualized.  LEFT VENTRICLE PLAX 2D LVIDd:         5.10 cm      Diastology LVIDs:         3.80 cm      LV e' medial:    4.46 cm/s LV PW:         1.20 cm      LV E/e' medial:  7.8 LV IVS:        1.20 cm      LV e' lateral:   4.35 cm/s LVOT diam:     2.20 cm       LV E/e' lateral: 8.0 LV SV:         72 LV SV Index:   35 LVOT Area:     3.80 cm  LV Volumes (MOD) LV vol d, MOD A2C: 96.3 ml LV vol d, MOD A4C: 119.0 ml LV vol s, MOD A2C: 55.0 ml LV vol s, MOD A4C: 45.5 ml LV SV MOD A2C:     41.3 ml LV SV MOD A4C:     119.0 ml LV SV MOD BP:      58.4 ml RIGHT VENTRICLE RV Basal diam:  3.30 cm RV Mid diam:    3.00 cm RV S prime:     12.60 cm/s TAPSE (M-mode): 1.8 cm LEFT ATRIUM           Index        RIGHT ATRIUM           Index LA diam:      3.20 cm 1.54 cm/m   RA Area:     11.20 cm LA Vol (A4C): 42.1 ml 20.31 ml/m  RA Volume:   20.30 ml  9.80 ml/m  AORTIC VALVE                     PULMONIC VALVE AV Area (Vmax):    2.42 cm      PV Vmax:       0.46 m/s AV Area (Vmean):   2.38 cm      PV Peak grad:  0.9 mmHg AV Area (VTI):     2.38 cm AV Vmax:           153.00 cm/s AV Vmean:          103.000 cm/s AV VTI:            0.303 m AV Peak Grad:      9.4 mmHg AV Mean Grad:      5.0 mmHg LVOT Vmax:         97.50 cm/s LVOT Vmean:        64.500 cm/s LVOT VTI:          0.190 m LVOT/AV VTI ratio: 0.63  AORTA Ao Root diam: 3.60 cm MITRAL VALVE MV Area (PHT): 3.37 cm    SHUNTS MV Decel Time: 225 msec    Systemic VTI:  0.19 m MV E velocity: 34.60 cm/s  Systemic Diam: 2.20 cm MV A velocity: 90.10 cm/s MV E/A ratio:  0.38 Mihai Croitoru MD Electronically signed by Sanda Klein MD Signature Date/Time: 04/08/2022/4:40:27 PM    Final    MR BRAIN WO CONTRAST  Result Date: 04/08/2022 CLINICAL DATA:  76 year old male with neurologic deficit. Age indeterminate left PCA territory infarct on head CT  yesterday. EXAM: MRI HEAD WITHOUT CONTRAST MRA HEAD WITHOUT CONTRAST TECHNIQUE: Multiplanar, multi-echo pulse sequences of the brain and surrounding structures were acquired without intravenous contrast. Angiographic images of the intracranial vasculature using MRA technique without intravenous contrast. COMPARISON:  Head CT yesterday.  Brain MRI 09/13/2020. FINDINGS: MRI HEAD WITHOUT CONTRAST Brain:  Restricted diffusion in the left PCA territory including the left thalamus, medial left occipital lobe, along the calcarine sulcus, left parietooccipital sulcus. Associated T2 and FLAIR hyperintense cytotoxic edema, T1 hypointensity, with questionable mild petechial hemorrhage. Mild mass effect on the left occipital horn. Superimposed trace restricted diffusion also in the right occipital lobe on series 5, image 70. No posterior fossa or anterior circulation diffusion restriction identified. No midline shift, mass effect, evidence of mass lesion, ventriculomegaly, extra-axial collection. Cervicomedullary junction and pituitary are within normal limits. Mild chronic microhemorrhage along the right parieto-occipital sulcus and in the right occipital pole seems new since 2022. No cortical encephalomalacia identified. Vascular: Major intracranial vascular flow voids are stable since last year, see MRA findings below. Skull and upper cervical spine: Negative for age visible cervical spine. Visualized bone marrow signal is within normal limits. Sinuses/Orbits: Negative. Other: Mastoids remain clear. Grossly normal visible internal auditory structures. MRA Antegrade flow in the posterior circulation with dominant distal vertebral artery. Both PICA origins and distal vertebral arteries are patent. Patent basilar artery. No distal vertebral or basilar stenosis. Patent SCA and PCA origins. Posterior communicating arteries are diminutive or absent. Left PCA P1 and P2 segments are patent. P3 bifurcation is patent. Probable occlusion of the superior left P3 division. Contralateral right PCA branches appear patent and within normal limits. Antegrade flow in both ICA siphons with tortuous ICAs just below the skull base. Right greater than left siphon irregularity, cannot exclude hemodynamically significant stenosis in the right petrous and cavernous segments. Left ICA siphon irregularity without significant stenosis. Patent  carotid termini. Left ACA A1 is dominant, right is diminutive or absent. Anterior communicating artery and visible ACA branches are within normal limits. MCA M1 segments and bi/trifurcations are patent without stenosis. Visible MCA branches are within normal limits. IMPRESSION: 1. 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 Also punctate contralateral right PCA infarct. Possible mild petechial hemorrhage. But no malignant hemorrhagic transformation or significant intracranial mass effect. 2. Intracranial MRA is negative for large vessel occlusion. But positive for evidence of Left PCA P3 branch occlusion, and ICA siphon atherosclerosis with possible tandem significant stenoses of the Right siphon. Electronically Signed   By: [name] M.D.   On: 04/08/2022 12:29   MR ANGIO HEAD WO CONTRAST  Result Date: 04/08/2022 CLINICAL DATA:  76 year old male with neurologic deficit. Age indeterminate left PCA territory infarct on head CT yesterday. EXAM: MRI HEAD WITHOUT CONTRAST MRA HEAD WITHOUT CONTRAST TECHNIQUE: Multiplanar, multi-echo pulse sequences of the brain and surrounding structures were acquired without intravenous contrast. Angiographic images of the intracranial vasculature using MRA technique without intravenous contrast. COMPARISON:  Head CT yesterday.  Brain MRI 09/13/2020. FINDINGS: MRI HEAD WITHOUT CONTRAST Brain: Restricted diffusion in the left PCA territory including the left thalamus, medial left occipital lobe, along the calcarine sulcus, left parietooccipital sulcus. Associated T2 and FLAIR hyperintense cytotoxic edema, T1 hypointensity, with questionable mild petechial hemorrhage. Mild mass effect on the left occipital horn. Superimposed trace restricted diffusion also in the right occipital lobe on series 5, image 70. No posterior fossa or anterior circulation diffusion restriction identified. No midline shift, mass effect, evidence of mass lesion,  ventriculomegaly, extra-axial collection. Cervicomedullary junction and pituitary are within normal limits. Mild chronic microhemorrhage along the right parieto-occipital sulcus and in the right occipital pole seems new since 2022. No cortical encephalomalacia identified. Vascular: Major intracranial vascular flow voids are stable since last year, see MRA findings below. Skull and upper cervical spine: Negative for age visible cervical spine. Visualized bone marrow signal is within normal limits. Sinuses/Orbits: Negative. Other: Mastoids remain clear. Grossly normal visible internal auditory structures. MRA Antegrade flow in the posterior circulation with dominant distal vertebral artery. Both PICA origins and distal vertebral arteries are patent. Patent basilar artery. No distal vertebral or basilar stenosis. Patent SCA and PCA origins. Posterior communicating arteries are diminutive or absent. Left PCA P1 and P2 segments are patent. P3 bifurcation is patent. Probable occlusion of the superior left P3 division. Contralateral right PCA branches appear patent and within normal limits. Antegrade flow in both ICA siphons with tortuous ICAs just below the skull base. Right greater than left siphon irregularity, cannot exclude hemodynamically significant stenosis in the right petrous and cavernous segments. Left ICA siphon irregularity without significant stenosis. Patent carotid termini. Left ACA A1 is dominant, right is diminutive or absent. Anterior communicating artery and visible ACA branches are within normal limits. MCA M1 segments and bi/trifurcations are patent without stenosis. Visible MCA branches are within normal limits. IMPRESSION: 1. 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 Also punctate contralateral right PCA infarct. Possible mild petechial hemorrhage. But no malignant hemorrhagic transformation or significant intracranial mass effect. 2. Intracranial MRA is  negative for large vessel occlusion. But positive for evidence of Left PCA P3 branch occlusion, and ICA siphon atherosclerosis with possible tandem significant stenoses of the Right siphon. Electronically Signed   By: [name] M.D.   On: 04/08/2022 12:29   DG Abd 2 Views  Result Date: 04/08/2022 CLINICAL DATA:  Abdominal pain. EXAM: ABDOMEN - 2 VIEW COMPARISON:  Abdomen pelvis CT 10/04/2021 FINDINGS: Mild gaseous distention of small bowel and colon is seen in the abdomen and pelvis without overtly obstructive pattern. Surgical clips in the right upper quadrant suggest prior cholecystectomy. Brachytherapy seeds overlie the expected location of the prostate gland. Degenerative changes noted lumbar spine. IMPRESSION: Mild gaseous distention of small bowel and colon without overtly obstructive pattern. Enteritis or ileus would be a consideration. Electronically Signed   By: Misty Stanley M.D.   On: 04/08/2022 11:52   CT Head Wo Contrast  Result Date: 04/07/2022 CLINICAL DATA:  Fall Wednesday, neck pain EXAM: CT HEAD WITHOUT CONTRAST CT CERVICAL SPINE WITHOUT CONTRAST TECHNIQUE: Multidetector CT imaging of the head and cervical spine was performed following the standard protocol without intravenous contrast. Multiplanar CT image reconstructions of the cervical spine were also generated. RADIATION DOSE REDUCTION: This exam was performed according to the departmental dose-optimization program which includes automated exposure control, adjustment of the mA and/or kV according to patient size and/or use of iterative reconstruction technique. COMPARISON:  09/13/2020 FINDINGS: CT HEAD FINDINGS Brain: No evidence of hemorrhage, hydrocephalus, extra-axial collection or mass lesion/mass effect. New cortical and subcortical hypodensity of the medial left occipital lobe (series 3, image 16). Mild periventricular and deep white matter hypodensity. Vascular: No hyperdense vessel or unexpected calcification. Skull: Normal.  Negative for fracture or focal lesion. Sinuses/Orbits: No acute finding. Other: None. CT CERVICAL SPINE FINDINGS Alignment: Normal. Skull base and vertebrae: No acute fracture. No primary bone lesion or focal pathologic process. Soft tissues and spinal canal: No prevertebral fluid or swelling.  No visible canal hematoma. Disc levels: Status post anterior cervical discectomy and fusion of C5-C6 with focally severe disc degenerative disease of C6-C7. Otherwise mild multilevel disc space height loss and osteophytosis. Upper chest: Negative. Other: None. IMPRESSION: 1. New cortical and subcortical hypodensity of the medial left occipital lobe, age indeterminate, suggestive of acute to subacute infarction or alternately edema. Consider MRI to further evaluate. 2. No fracture or static subluxation of the cervical spine. 3. Status post anterior cervical discectomy and fusion of C5-C6 with focally severe disc degenerative disease of C6-C7. Electronically Signed   By: Delanna Ahmadi M.D.   On: 04/07/2022 14:54   CT Cervical Spine Wo Contrast  Result Date: 04/07/2022 CLINICAL DATA:  Fall Wednesday, neck pain EXAM: CT HEAD WITHOUT CONTRAST CT CERVICAL SPINE WITHOUT CONTRAST TECHNIQUE: Multidetector CT imaging of the head and cervical spine was performed following the standard protocol without intravenous contrast. Multiplanar CT image reconstructions of the cervical spine were also generated. RADIATION DOSE REDUCTION: This exam was performed according to the departmental dose-optimization program which includes automated exposure control, adjustment of the mA and/or kV according to patient size and/or use of iterative reconstruction technique. COMPARISON:  09/13/2020 FINDINGS: CT HEAD FINDINGS Brain: No evidence of hemorrhage, hydrocephalus, extra-axial collection or mass lesion/mass effect. New cortical and subcortical hypodensity of the medial left occipital lobe (series 3, image 16). Mild periventricular and deep white  matter hypodensity. Vascular: No hyperdense vessel or unexpected calcification. Skull: Normal. Negative for fracture or focal lesion. Sinuses/Orbits: No acute finding. Other: None. CT CERVICAL SPINE FINDINGS Alignment: Normal. Skull base and vertebrae: No acute fracture. No primary bone lesion or focal pathologic process. Soft tissues and spinal canal: No prevertebral fluid or swelling. No visible canal hematoma. Disc levels: Status post anterior cervical discectomy and fusion of C5-C6 with focally severe disc degenerative disease of C6-C7. Otherwise mild multilevel disc space height loss and osteophytosis. Upper chest: Negative. Other: None. IMPRESSION: 1. New cortical and subcortical hypodensity of the medial left occipital lobe, age indeterminate, suggestive of acute to subacute infarction or alternately edema. Consider MRI to further evaluate. 2. No fracture or static subluxation of the cervical spine. 3. Status post anterior cervical discectomy and fusion of C5-C6 with focally severe disc degenerative disease of C6-C7. Electronically Signed   By: Delanna Ahmadi M.D.   On: 04/07/2022 14:54   DG Ribs Unilateral W/Chest Right  Result Date: 04/07/2022 CLINICAL DATA:  RIGHT chest and rib pain following fall. Initial encounter. EXAM: RIGHT RIBS AND CHEST - 3+ VIEW COMPARISON:  01/07/2022 chest radiograph and prior studies FINDINGS: Cardiomegaly again noted. There is no evidence of focal airspace disease, pulmonary edema, suspicious pulmonary nodule/mass, pleural effusion, or pneumothorax. No acute bony abnormalities are identified. A remote RIGHT 2nd rib fracture is again noted. IMPRESSION: 1. No evidence of acute abnormality. No evidence of acute rib fracture. 2. Cardiomegaly. Electronically Signed   By: Margarette Canada M.D.   On: 04/07/2022 14:35     PERTINENT LAB RESULTS: CBC: Recent Labs    04/07/22 1325 04/08/22 0321  WBC 3.9* 3.7*  HGB 7.7* 7.3*  HCT 23.4* 21.9*  PLT 227 217   CMET CMP      Component Value Date/Time   NA 139 04/09/2022 0408   NA 138 12/02/2020 0000   K 3.4 (L) 04/09/2022 0408   CL 109 04/09/2022 0408   CO2 16 (L) 04/09/2022 0408   GLUCOSE 85 04/09/2022 0408   BUN 39 (H) 04/09/2022 0408   BUN 37 (  A) 12/02/2020 0000   CREATININE 7.24 (H) 04/09/2022 0408   CREATININE 5.44 (HH) 10/04/2021 0951   CREATININE 2.11 (H) 12/18/2019 1425   CALCIUM 9.6 04/09/2022 0408   PROT 6.8 04/07/2022 1325   ALBUMIN 3.3 (L) 04/07/2022 1325   AST 25 04/07/2022 1325   AST 28 10/04/2021 0951   ALT 17 04/07/2022 1325   ALT 19 10/04/2021 0951   ALKPHOS 62 04/07/2022 1325   BILITOT 0.4 04/07/2022 1325   BILITOT 0.3 10/04/2021 0951   GFRNONAA 7 (L) 04/09/2022 0408   GFRNONAA 10 (L) 10/04/2021 0951   GFRNONAA 30 (L) 12/18/2019 1425   GFRAA 33 (L) 02/16/2020 1257   GFRAA 35 (L) 12/18/2019 1425    GFR Estimated Creatinine Clearance: 9.4 mL/min (A) (by C-G formula based on SCr of 7.24 mg/dL (H)). No results for input(s): "LIPASE", "AMYLASE" in the last 72 hours. No results for input(s): "CKTOTAL", "CKMB", "CKMBINDEX", "TROPONINI" in the last 72 hours. Invalid input(s): "POCBNP" No results for input(s): "DDIMER" in the last 72 hours. No results for input(s): "HGBA1C" in the last 72 hours. Recent Labs    04/08/22 0321  CHOL 219*  HDL 37*  LDLCALC 159*  TRIG 113  CHOLHDL 5.9   No results for input(s): "TSH", "T4TOTAL", "T3FREE", "THYROIDAB" in the last 72 hours.  Invalid input(s): "FREET3" No results for input(s): "VITAMINB12", "FOLATE", "FERRITIN", "TIBC", "IRON", "RETICCTPCT" in the last 72 hours. Coags: No results for input(s): "INR" in the last 72 hours.  Invalid input(s): "PT" Microbiology: No results found for this or any previous visit (from the past 240 hour(s)).  FURTHER DISCHARGE INSTRUCTIONS:  Get Medicines reviewed and adjusted: Please take all your medications with you for your next visit with your Primary MD  Laboratory/radiological data: Please  request your Primary MD to go over all hospital tests and procedure/radiological results at the follow up, please ask your Primary MD to get all Hospital records sent to his/her office.  In some cases, they will be blood work, cultures and biopsy results pending at the time of your discharge. Please request that your primary care M.D. goes through all the records of your hospital data and follows up on these results.  Also Note the following: If you experience worsening of your admission symptoms, develop shortness of breath, life threatening emergency, suicidal or homicidal thoughts you must seek medical attention immediately by calling 911 or calling your MD immediately  if symptoms less severe.  You must read complete instructions/literature along with all the possible adverse reactions/side effects for all the Medicines you take and that have been prescribed to you. Take any new Medicines after you have completely understood and accpet all the possible adverse reactions/side effects.   Do not drive when taking Pain medications or sleeping medications (Benzodaizepines)  Do not take more than prescribed Pain, Sleep and Anxiety Medications. It is not advisable to combine anxiety,sleep and pain medications without talking with your primary care practitioner  Special Instructions: If you have smoked or chewed Tobacco  in the last 2 yrs please stop smoking, stop any regular Alcohol  and or any Recreational drug use.  Wear Seat belts while driving.  Please note: You were cared for by a hospitalist during your hospital stay. Once you are discharged, your primary care physician will handle any further medical issues. Please note that NO REFILLS for any discharge medications will be authorized once you are discharged, as it is imperative that you return to your primary care physician (or establish a  relationship with a primary care physician if you do not have one) for your post hospital discharge needs  so that they can reassess your need for medications and monitor your lab values.  Total Time spent coordinating discharge including counseling, education and face to face time equals greater than 30 minutes.  SignedOren Binet 04/09/2022 8:59 AM

## 2022-04-09 NOTE — Progress Notes (Signed)
STROKE TEAM PROGRESS NOTE   SUBJECTIVE (INTERVAL HISTORY) Pt neuro stable, no acute event overnight. Pt still has anemia and Hb 7.3. he has refused dialysis in the past and this time, making his anemia difficult to improve. He is not good candidate for anticoagulation. Will not pursue loop this time. Educated on no driving with hemianopia.    OBJECTIVE Temp:  [98 F (36.7 C)-98.9 F (37.2 C)] 98 F (36.7 C) (11/26 1213) Pulse Rate:  [75-79] 79 (11/26 1213) Cardiac Rhythm: Normal sinus rhythm (11/26 0700) Resp:  [14-16] 14 (11/26 0809) BP: (129-153)/(80-89) 153/89 (11/26 1213) SpO2:  [99 %-100 %] 100 % (11/26 1213)  No results for input(s): "GLUCAP" in the last 168 hours. Recent Labs  Lab 04/07/22 1325 04/08/22 0321 04/09/22 0408  NA 140 142 139  K 3.5 3.5 3.4*  CL 113* 111 109  CO2 17* 17* 16*  GLUCOSE 91 108* 85  BUN 41* 39* 39*  CREATININE 7.04* 6.95* 7.24*  CALCIUM 9.2 9.7 9.6   Recent Labs  Lab 04/07/22 1325  AST 25  ALT 17  ALKPHOS 62  BILITOT 0.4  PROT 6.8  ALBUMIN 3.3*   Recent Labs  Lab 04/07/22 1325 04/08/22 0321  WBC 3.9* 3.7*  HGB 7.7* 7.3*  HCT 23.4* 21.9*  MCV 95.5 94.4  PLT 227 217   No results for input(s): "CKTOTAL", "CKMB", "CKMBINDEX", "TROPONINI" in the last 168 hours. No results for input(s): "LABPROT", "INR" in the last 72 hours. No results for input(s): "COLORURINE", "LABSPEC", "PHURINE", "GLUCOSEU", "HGBUR", "BILIRUBINUR", "KETONESUR", "PROTEINUR", "UROBILINOGEN", "NITRITE", "LEUKOCYTESUR" in the last 72 hours.  Invalid input(s): "APPERANCEUR"     Component Value Date/Time   CHOL 219 (H) 04/08/2022 0321   TRIG 113 04/08/2022 0321   HDL 37 (L) 04/08/2022 0321   CHOLHDL 5.9 04/08/2022 0321   VLDL 23 04/08/2022 0321   LDLCALC 159 (H) 04/08/2022 0321   Lab Results  Component Value Date   HGBA1C 5.8 (H) 05/23/2021   No results found for: "LABOPIA", "COCAINSCRNUR", "LABBENZ", "AMPHETMU", "THCU", "LABBARB"  No results for  input(s): "ETH" in the last 168 hours.  I have personally reviewed the radiological images below and agree with the radiology interpretations.  VAS [place] LOWER EXTREMITY VENOUS (DVT)  Result Date: 04/08/2022  Lower Venous DVT Study Patient Name:  CHORD TAKAHASHI  Date of Exam:   04/08/2022 Medical Rec #: [id number]            Accession #:    [id number] Date of Birth: May 01, 1946           Patient Gender: M Patient Age:   76 years Exam Location:  [facility] Procedure:      VAS [place] LOWER EXTREMITY VENOUS (DVT) Referring Phys: Cornelius Moras Antoni Stefan --------------------------------------------------------------------------------  Indications: Stroke.  Comparison Study: No prior study Performing Technologist: Sharion Dove RVS  Examination Guidelines: A complete evaluation includes B-mode imaging, spectral Doppler, color Doppler, and power Doppler as needed of all accessible portions of each vessel. Bilateral testing is considered an integral part of a complete examination. Limited examinations for reoccurring indications may be performed as noted. The reflux portion of the exam is performed with the patient in reverse Trendelenburg.  +---------+---------------+---------+-----------+----------+--------------+ RIGHT    CompressibilityPhasicitySpontaneityPropertiesThrombus Aging +---------+---------------+---------+-----------+----------+--------------+ CFV      Full           Yes      Yes                                 +---------+---------------+---------+-----------+----------+--------------+  SFJ      Full                                                        +---------+---------------+---------+-----------+----------+--------------+ FV Prox  Full                                                        +---------+---------------+---------+-----------+----------+--------------+ FV Mid   Full                                                         +---------+---------------+---------+-----------+----------+--------------+ FV DistalFull                                                        +---------+---------------+---------+-----------+----------+--------------+ PFV      Full                                                        +---------+---------------+---------+-----------+----------+--------------+ POP      Full           Yes      Yes                                 +---------+---------------+---------+-----------+----------+--------------+ PTV      Full                                                        +---------+---------------+---------+-----------+----------+--------------+ PERO     Full                                                        +---------+---------------+---------+-----------+----------+--------------+   +---------+---------------+---------+-----------+----------+--------------+ LEFT     CompressibilityPhasicitySpontaneityPropertiesThrombus Aging +---------+---------------+---------+-----------+----------+--------------+ CFV      Full           Yes      Yes                                 +---------+---------------+---------+-----------+----------+--------------+ SFJ      Full                                                        +---------+---------------+---------+-----------+----------+--------------+  FV Prox  Full                                                        +---------+---------------+---------+-----------+----------+--------------+ FV Mid   Full                                                        +---------+---------------+---------+-----------+----------+--------------+ FV DistalFull                                                        +---------+---------------+---------+-----------+----------+--------------+ PFV      Full                                                         +---------+---------------+---------+-----------+----------+--------------+ POP      Full           Yes      Yes                                 +---------+---------------+---------+-----------+----------+--------------+ PTV      Full                                                        +---------+---------------+---------+-----------+----------+--------------+ PERO     Full                                                        +---------+---------------+---------+-----------+----------+--------------+     Summary: BILATERAL: - No evidence of deep vein thrombosis seen in the lower extremities, bilaterally. -No evidence of popliteal cyst, bilaterally.   *See table(s) above for measurements and observations. Electronically signed by Harold Barban MD on 04/08/2022 at 8:49:42 PM.    Final    VAS US CAROTID  Result Date: 04/08/2022 Carotid Arterial Duplex Study Patient Name:  REACE BRESHEARS  Date of Exam:   04/08/2022 Medical Rec #: [id number]            Accession #:    [id number] Date of Birth: 1946/03/31           Patient Gender: M Patient Age:   18 years Exam Location:  [facility] Procedure:      VAS US CAROTID Referring Phys: Cornelius Moras Izzie Geers --------------------------------------------------------------------------------  Indications:       Progressive memory loss, gait disturbance, question CVA. Risk Factors:      Diabetes. Limitations        Today's exam was limited  due to the high bifurcation of the                    carotid. Comparison Study:  Prior normal Carotid duplex done 10/14/14 Performing Technologist: Sharion Dove RVS  Examination Guidelines: A complete evaluation includes B-mode imaging, spectral Doppler, color Doppler, and power Doppler as needed of all accessible portions of each vessel. Bilateral testing is considered an integral part of a complete examination. Limited examinations for reoccurring indications may be performed as noted.  Right Carotid Findings:  +----------+--------+--------+--------+------------------+--------+           PSV cm/sEDV cm/sStenosisPlaque DescriptionComments +----------+--------+--------+--------+------------------+--------+ CCA Prox  94      18              heterogenous               +----------+--------+--------+--------+------------------+--------+ CCA Distal65      19              homogeneous                +----------+--------+--------+--------+------------------+--------+ ICA Prox  43      14              heterogenous               +----------+--------+--------+--------+------------------+--------+ ICA Mid   49      14                                         +----------+--------+--------+--------+------------------+--------+ ICA Distal44      12                                         +----------+--------+--------+--------+------------------+--------+ ECA       54      9                                          +----------+--------+--------+--------+------------------+--------+ +----------+--------+-------+--------+-------------------+           PSV cm/sEDV cmsDescribeArm Pressure (mmHG) +----------+--------+-------+--------+-------------------+ Subclavian116                                        +----------+--------+-------+--------+-------------------+ +---------+--------+--+--------+-+ VertebralPSV cm/s31EDV cm/s9 +---------+--------+--+--------+-+  Left Carotid Findings: +----------+--------+--------+--------+------------------+------------------+           PSV cm/sEDV cm/sStenosisPlaque DescriptionComments           +----------+--------+--------+--------+------------------+------------------+ CCA Prox  109     19                                intimal thickening +----------+--------+--------+--------+------------------+------------------+ CCA Distal86      18                                intimal thickening  +----------+--------+--------+--------+------------------+------------------+ ICA Prox  46      12              heterogenous                         +----------+--------+--------+--------+------------------+------------------+  ICA Mid   48      14                                                   +----------+--------+--------+--------+------------------+------------------+ ICA Distal51      15                                                   +----------+--------+--------+--------+------------------+------------------+ ECA       73      18                                                   +----------+--------+--------+--------+------------------+------------------+ +----------+--------+--------+--------+-------------------+           PSV cm/sEDV cm/sDescribeArm Pressure (mmHG) +----------+--------+--------+--------+-------------------+ [id number]                                          +----------+--------+--------+--------+-------------------+ +---------+--------+--+--------+--+ VertebralPSV cm/s71EDV cm/s20 +---------+--------+--+--------+--+   Summary: Right Carotid: The extracranial vessels were near-normal with only minimal wall                thickening or plaque. Left Carotid: The extracranial vessels were near-normal with only minimal wall               thickening or plaque. Vertebrals:  Bilateral vertebral arteries demonstrate antegrade flow. Subclavians: Normal flow hemodynamics were seen in bilateral subclavian              arteries. *See table(s) above for measurements and observations.  Electronically signed by Harold Barban MD on 04/08/2022 at 8:49:22 PM.    Final    EEG adult  Result Date: 04/08/2022 Derek Jack, MD     04/08/2022  6:46 PM Routine EEG Report SAMNANG SHUGARS is a 76 y.o. male with a history of syncope who is undergoing an EEG to evaluate for seizures. Report: This EEG was acquired with electrodes placed according to the  International 10-20 electrode system (including Fp1, Fp2, F3, F4, C3, C4, P3, P4, O1, O2, T3, T4, T5, T6, A1, A2, Fz, Cz, Pz). The following electrodes were missing or displaced: none. The occipital dominant rhythm was 8.5 Hz. This activity is reactive to stimulation. Drowsiness was manifested by background fragmentation; deeper stages of sleep were identified by K complexes and sleep spindles. There was no focal slowing. There were no interictal epileptiform discharges. There were no electrographic seizures identified. Photic stimulation and hyperventilation were not performed. Impression: This EEG was obtained while awake and asleep and is normal.   Clinical Correlation: Normal EEGs, however, do not rule out epilepsy. Su Monks, MD [facility] [phone] If 7pm- 7am, please page neurology on call as listed in [place].   ECHOCARDIOGRAM COMPLETE  Result Date: 04/08/2022    ECHOCARDIOGRAM REPORT   Patient Name:   DARIUSH MCNELLIS Date of Exam: 04/08/2022 Medical Rec #:  [id number]           Height:  71.0 in Accession #:    [id number]          Weight:       192.0 lb Date of Birth:  Oct 09, 1945          BSA:          2.072 m Patient Age:    17 years            BP:           167/87 mmHg Patient Gender: M                   HR:           69 bpm. Exam Location:  Inpatient Procedure: 2D Echo Indications:    stroke  History:        Patient has prior history of Echocardiogram examinations, most                 recent 01/07/2022. Signs/Symptoms:Murmur; Risk Factors:Diabetes                 and Hypertension.  Sonographer:    Harvie Junior Referring Phys: [id number] [facility] Jlynn Ly  Sonographer Comments: Technically difficult study due to poor echo windows and no subcostal window. IMPRESSIONS  1. Left ventricular ejection fraction, by estimation, is 50 to 55%. The left ventricle has normal function. The left ventricle has no regional wall motion abnormalities. There is mild concentric left ventricular  hypertrophy. Left ventricular diastolic parameters are consistent with Grade I diastolic dysfunction (impaired relaxation).  2. Right ventricular systolic function is normal. The right ventricular size is normal. Tricuspid regurgitation signal is inadequate for assessing PA pressure.  3. The mitral valve is normal in structure. No evidence of mitral valve regurgitation.  4. Aortic valve gradients and the degree of aortic stenosis may be underestimated due to poor Doppler jet alignment. Morphologically, expect there is mild-moderate aortic stenosis. The aortic valve is tricuspid. There is mild calcification of the aortic  valve. There is moderate thickening of the aortic valve. Aortic valve regurgitation is not visualized. Aortic valve sclerosis/calcification is present, without any evidence of aortic stenosis. FINDINGS  Left Ventricle: Left ventricular ejection fraction, by estimation, is 50 to 55%. The left ventricle has normal function. The left ventricle has no regional wall motion abnormalities. The left ventricular internal cavity size was normal in size. There is  mild concentric left ventricular hypertrophy. Left ventricular diastolic parameters are consistent with Grade I diastolic dysfunction (impaired relaxation). Normal left ventricular filling pressure. Right Ventricle: The right ventricular size is normal. No increase in right ventricular wall thickness. Right ventricular systolic function is normal. Tricuspid regurgitation signal is inadequate for assessing PA pressure. Left Atrium: Left atrial size was normal in size. Right Atrium: Right atrial size was normal in size. Pericardium: There is no evidence of pericardial effusion. Mitral Valve: The mitral valve is normal in structure. No evidence of mitral valve regurgitation. Tricuspid Valve: The tricuspid valve is normal in structure. Tricuspid valve regurgitation is not demonstrated. Aortic Valve: Aortic valve gradients and the degree of aortic  stenosis may be underestimated due to poor Doppler jet alignment. Morphologically, expect there is mild-moderate aortic stenosis. The aortic valve is tricuspid. There is mild calcification of the aortic valve. There is moderate thickening of the aortic valve. Aortic valve regurgitation is not visualized. Aortic valve sclerosis/calcification is present, without any evidence of aortic stenosis. Aortic valve mean gradient measures 5.0 mmHg. Aortic valve peak gradient measures 9.4 mmHg. Aortic valve area, by  VTI measures 2.38 cm. Pulmonic Valve: The pulmonic valve was not well visualized. Aorta: The aortic root is normal in size and structure. IAS/Shunts: The interatrial septum was not well visualized.  LEFT VENTRICLE PLAX 2D LVIDd:         5.10 cm      Diastology LVIDs:         3.80 cm      LV e' medial:    4.46 cm/s LV PW:         1.20 cm      LV E/e' medial:  7.8 LV IVS:        1.20 cm      LV e' lateral:   4.35 cm/s LVOT diam:     2.20 cm      LV E/e' lateral: 8.0 LV SV:         72 LV SV Index:   35 LVOT Area:     3.80 cm  LV Volumes (MOD) LV vol d, MOD A2C: 96.3 ml LV vol d, MOD A4C: 119.0 ml LV vol s, MOD A2C: 55.0 ml LV vol s, MOD A4C: 45.5 ml LV SV MOD A2C:     41.3 ml LV SV MOD A4C:     119.0 ml LV SV MOD BP:      58.4 ml RIGHT VENTRICLE RV Basal diam:  3.30 cm RV Mid diam:    3.00 cm RV S prime:     12.60 cm/s TAPSE (M-mode): 1.8 cm LEFT ATRIUM           Index        RIGHT ATRIUM           Index LA diam:      3.20 cm 1.54 cm/m   RA Area:     11.20 cm LA Vol (A4C): 42.1 ml 20.31 ml/m  RA Volume:   20.30 ml  9.80 ml/m  AORTIC VALVE                     PULMONIC VALVE AV Area (Vmax):    2.42 cm      PV Vmax:       0.46 m/s AV Area (Vmean):   2.38 cm      PV Peak grad:  0.9 mmHg AV Area (VTI):     2.38 cm AV Vmax:           153.00 cm/s AV Vmean:          103.000 cm/s AV VTI:            0.303 m AV Peak Grad:      9.4 mmHg AV Mean Grad:      5.0 mmHg LVOT Vmax:         97.50 cm/s LVOT Vmean:        64.500  cm/s LVOT VTI:          0.190 m LVOT/AV VTI ratio: 0.63  AORTA Ao Root diam: 3.60 cm MITRAL VALVE MV Area (PHT): 3.37 cm    SHUNTS MV Decel Time: 225 msec    Systemic VTI:  0.19 m MV E velocity: 34.60 cm/s  Systemic Diam: 2.20 cm MV A velocity: 90.10 cm/s MV E/A ratio:  0.38 Mihai Croitoru MD Electronically signed by Sanda Klein MD Signature Date/Time: 04/08/2022/4:40:27 PM    Final    MR BRAIN WO CONTRAST  Result Date: 04/08/2022 CLINICAL DATA:  76 year old male with neurologic deficit. Age indeterminate left PCA territory infarct on head  CT yesterday. EXAM: MRI HEAD WITHOUT CONTRAST MRA HEAD WITHOUT CONTRAST TECHNIQUE: Multiplanar, multi-echo pulse sequences of the brain and surrounding structures were acquired without intravenous contrast. Angiographic images of the intracranial vasculature using MRA technique without intravenous contrast. COMPARISON:  Head CT yesterday.  Brain MRI 09/13/2020. FINDINGS: MRI HEAD WITHOUT CONTRAST Brain: Restricted diffusion in the left PCA territory including the left thalamus, medial left occipital lobe, along the calcarine sulcus, left parietooccipital sulcus. Associated T2 and FLAIR hyperintense cytotoxic edema, T1 hypointensity, with questionable mild petechial hemorrhage. Mild mass effect on the left occipital horn. Superimposed trace restricted diffusion also in the right occipital lobe on series 5, image 70. No posterior fossa or anterior circulation diffusion restriction identified. No midline shift, mass effect, evidence of mass lesion, ventriculomegaly, extra-axial collection. Cervicomedullary junction and pituitary are within normal limits. Mild chronic microhemorrhage along the right parieto-occipital sulcus and in the right occipital pole seems new since 2022. No cortical encephalomalacia identified. Vascular: Major intracranial vascular flow voids are stable since last year, see MRA findings below. Skull and upper cervical spine: Negative for age visible  cervical spine. Visualized bone marrow signal is within normal limits. Sinuses/Orbits: Negative. Other: Mastoids remain clear. Grossly normal visible internal auditory structures. MRA Antegrade flow in the posterior circulation with dominant distal vertebral artery. Both PICA origins and distal vertebral arteries are patent. Patent basilar artery. No distal vertebral or basilar stenosis. Patent SCA and PCA origins. Posterior communicating arteries are diminutive or absent. Left PCA P1 and P2 segments are patent. P3 bifurcation is patent. Probable occlusion of the superior left P3 division. Contralateral right PCA branches appear patent and within normal limits. Antegrade flow in both ICA siphons with tortuous ICAs just below the skull base. Right greater than left siphon irregularity, cannot exclude hemodynamically significant stenosis in the right petrous and cavernous segments. Left ICA siphon irregularity without significant stenosis. Patent carotid termini. Left ACA A1 is dominant, right is diminutive or absent. Anterior communicating artery and visible ACA branches are within normal limits. MCA M1 segments and bi/trifurcations are patent without stenosis. Visible MCA branches are within normal limits. IMPRESSION: 1. 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 Also punctate contralateral right PCA infarct. Possible mild petechial hemorrhage. But no malignant hemorrhagic transformation or significant intracranial mass effect. 2. Intracranial MRA is negative for large vessel occlusion. But positive for evidence of Left PCA P3 branch occlusion, and ICA siphon atherosclerosis with possible tandem significant stenoses of the Right siphon. Electronically Signed   By: [name] M.D.   On: 04/08/2022 12:29   MR ANGIO HEAD WO CONTRAST  Result Date: 04/08/2022 CLINICAL DATA:  76 year old male with neurologic deficit. Age indeterminate left PCA territory infarct on head CT  yesterday. EXAM: MRI HEAD WITHOUT CONTRAST MRA HEAD WITHOUT CONTRAST TECHNIQUE: Multiplanar, multi-echo pulse sequences of the brain and surrounding structures were acquired without intravenous contrast. Angiographic images of the intracranial vasculature using MRA technique without intravenous contrast. COMPARISON:  Head CT yesterday.  Brain MRI 09/13/2020. FINDINGS: MRI HEAD WITHOUT CONTRAST Brain: Restricted diffusion in the left PCA territory including the left thalamus, medial left occipital lobe, along the calcarine sulcus, left parietooccipital sulcus. Associated T2 and FLAIR hyperintense cytotoxic edema, T1 hypointensity, with questionable mild petechial hemorrhage. Mild mass effect on the left occipital horn. Superimposed trace restricted diffusion also in the right occipital lobe on series 5, image 70. No posterior fossa or anterior circulation diffusion restriction identified. No midline shift, mass effect, evidence of mass lesion,  ventriculomegaly, extra-axial collection. Cervicomedullary junction and pituitary are within normal limits. Mild chronic microhemorrhage along the right parieto-occipital sulcus and in the right occipital pole seems new since 2022. No cortical encephalomalacia identified. Vascular: Major intracranial vascular flow voids are stable since last year, see MRA findings below. Skull and upper cervical spine: Negative for age visible cervical spine. Visualized bone marrow signal is within normal limits. Sinuses/Orbits: Negative. Other: Mastoids remain clear. Grossly normal visible internal auditory structures. MRA Antegrade flow in the posterior circulation with dominant distal vertebral artery. Both PICA origins and distal vertebral arteries are patent. Patent basilar artery. No distal vertebral or basilar stenosis. Patent SCA and PCA origins. Posterior communicating arteries are diminutive or absent. Left PCA P1 and P2 segments are patent. P3 bifurcation is patent. Probable  occlusion of the superior left P3 division. Contralateral right PCA branches appear patent and within normal limits. Antegrade flow in both ICA siphons with tortuous ICAs just below the skull base. Right greater than left siphon irregularity, cannot exclude hemodynamically significant stenosis in the right petrous and cavernous segments. Left ICA siphon irregularity without significant stenosis. Patent carotid termini. Left ACA A1 is dominant, right is diminutive or absent. Anterior communicating artery and visible ACA branches are within normal limits. MCA M1 segments and bi/trifurcations are patent without stenosis. Visible MCA branches are within normal limits. IMPRESSION: 1. 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 Also punctate contralateral right PCA infarct. Possible mild petechial hemorrhage. But no malignant hemorrhagic transformation or significant intracranial mass effect. 2. Intracranial MRA is negative for large vessel occlusion. But positive for evidence of Left PCA P3 branch occlusion, and ICA siphon atherosclerosis with possible tandem significant stenoses of the Right siphon. Electronically Signed   By: [name] M.D.   On: 04/08/2022 12:29   DG Abd 2 Views  Result Date: 04/08/2022 CLINICAL DATA:  Abdominal pain. EXAM: ABDOMEN - 2 VIEW COMPARISON:  Abdomen pelvis CT 10/04/2021 FINDINGS: Mild gaseous distention of small bowel and colon is seen in the abdomen and pelvis without overtly obstructive pattern. Surgical clips in the right upper quadrant suggest prior cholecystectomy. Brachytherapy seeds overlie the expected location of the prostate gland. Degenerative changes noted lumbar spine. IMPRESSION: Mild gaseous distention of small bowel and colon without overtly obstructive pattern. Enteritis or ileus would be a consideration. Electronically Signed   By: Misty Stanley M.D.   On: 04/08/2022 11:52   CT Head Wo Contrast  Result Date:  04/07/2022 CLINICAL DATA:  Fall Wednesday, neck pain EXAM: CT HEAD WITHOUT CONTRAST CT CERVICAL SPINE WITHOUT CONTRAST TECHNIQUE: Multidetector CT imaging of the head and cervical spine was performed following the standard protocol without intravenous contrast. Multiplanar CT image reconstructions of the cervical spine were also generated. RADIATION DOSE REDUCTION: This exam was performed according to the departmental dose-optimization program which includes automated exposure control, adjustment of the mA and/or kV according to patient size and/or use of iterative reconstruction technique. COMPARISON:  09/13/2020 FINDINGS: CT HEAD FINDINGS Brain: No evidence of hemorrhage, hydrocephalus, extra-axial collection or mass lesion/mass effect. New cortical and subcortical hypodensity of the medial left occipital lobe (series 3, image 16). Mild periventricular and deep white matter hypodensity. Vascular: No hyperdense vessel or unexpected calcification. Skull: Normal. Negative for fracture or focal lesion. Sinuses/Orbits: No acute finding. Other: None. CT CERVICAL SPINE FINDINGS Alignment: Normal. Skull base and vertebrae: No acute fracture. No primary bone lesion or focal pathologic process. Soft tissues and spinal canal: No prevertebral fluid or swelling.  No visible canal hematoma. Disc levels: Status post anterior cervical discectomy and fusion of C5-C6 with focally severe disc degenerative disease of C6-C7. Otherwise mild multilevel disc space height loss and osteophytosis. Upper chest: Negative. Other: None. IMPRESSION: 1. New cortical and subcortical hypodensity of the medial left occipital lobe, age indeterminate, suggestive of acute to subacute infarction or alternately edema. Consider MRI to further evaluate. 2. No fracture or static subluxation of the cervical spine. 3. Status post anterior cervical discectomy and fusion of C5-C6 with focally severe disc degenerative disease of C6-C7. Electronically Signed    By: Delanna Ahmadi M.D.   On: 04/07/2022 14:54   CT Cervical Spine Wo Contrast  Result Date: 04/07/2022 CLINICAL DATA:  Fall Wednesday, neck pain EXAM: CT HEAD WITHOUT CONTRAST CT CERVICAL SPINE WITHOUT CONTRAST TECHNIQUE: Multidetector CT imaging of the head and cervical spine was performed following the standard protocol without intravenous contrast. Multiplanar CT image reconstructions of the cervical spine were also generated. RADIATION DOSE REDUCTION: This exam was performed according to the departmental dose-optimization program which includes automated exposure control, adjustment of the mA and/or kV according to patient size and/or use of iterative reconstruction technique. COMPARISON:  09/13/2020 FINDINGS: CT HEAD FINDINGS Brain: No evidence of hemorrhage, hydrocephalus, extra-axial collection or mass lesion/mass effect. New cortical and subcortical hypodensity of the medial left occipital lobe (series 3, image 16). Mild periventricular and deep white matter hypodensity. Vascular: No hyperdense vessel or unexpected calcification. Skull: Normal. Negative for fracture or focal lesion. Sinuses/Orbits: No acute finding. Other: None. CT CERVICAL SPINE FINDINGS Alignment: Normal. Skull base and vertebrae: No acute fracture. No primary bone lesion or focal pathologic process. Soft tissues and spinal canal: No prevertebral fluid or swelling. No visible canal hematoma. Disc levels: Status post anterior cervical discectomy and fusion of C5-C6 with focally severe disc degenerative disease of C6-C7. Otherwise mild multilevel disc space height loss and osteophytosis. Upper chest: Negative. Other: None. IMPRESSION: 1. New cortical and subcortical hypodensity of the medial left occipital lobe, age indeterminate, suggestive of acute to subacute infarction or alternately edema. Consider MRI to further evaluate. 2. No fracture or static subluxation of the cervical spine. 3. Status post anterior cervical discectomy and  fusion of C5-C6 with focally severe disc degenerative disease of C6-C7. Electronically Signed   By: Delanna Ahmadi M.D.   On: 04/07/2022 14:54   DG Ribs Unilateral W/Chest Right  Result Date: 04/07/2022 CLINICAL DATA:  RIGHT chest and rib pain following fall. Initial encounter. EXAM: RIGHT RIBS AND CHEST - 3+ VIEW COMPARISON:  01/07/2022 chest radiograph and prior studies FINDINGS: Cardiomegaly again noted. There is no evidence of focal airspace disease, pulmonary edema, suspicious pulmonary nodule/mass, pleural effusion, or pneumothorax. No acute bony abnormalities are identified. A remote RIGHT 2nd rib fracture is again noted. IMPRESSION: 1. No evidence of acute abnormality. No evidence of acute rib fracture. 2. Cardiomegaly. Electronically Signed   By: Margarette Canada M.D.   On: 04/07/2022 14:35     PHYSICAL EXAM  Temp:  [98 F (36.7 C)-98.9 F (37.2 C)] 98 F (36.7 C) (11/26 1213) Pulse Rate:  [75-79] 79 (11/26 1213) Resp:  [14-16] 14 (11/26 0809) BP: (129-153)/(80-89) 153/89 (11/26 1213) SpO2:  [99 %-100 %] 100 % (11/26 1213)  General - Well nourished, well developed, in no apparent distress.  Ophthalmologic - fundi not visualized due to noncooperation.  Cardiovascular - Regular rhythm and rate.  Mental Status -  Level of arousal and orientation to month, place, and person were intact.  But not orientated to year.  Language including expression, naming, repetition, comprehension was assessed and found intact. Fund of Knowledge was assessed and was intact.   Cranial Nerves II - XII - II - Vision field test showed right HH III, IV, VI - Extraocular movements intact. V - Facial sensation intact bilaterally. VII - Facial movement intact bilaterally. VIII - Hearing & vestibular intact bilaterally. X - Palate elevates symmetrically. XI - Chin turning & shoulder shrug intact bilaterally. XII - Tongue protrusion intact.   Motor Strength - The patient's strength was normal in all  extremities and pronator drift was absent.    Motor Tone & Bulk - Muscle tone was assessed at the neck and appendages and was normal.  Bulk was normal and fasciculations were absent.    Reflexes - The patient's reflexes were normal in all extremities and he had no pathological reflexes.   Sensory - Light touch, temperature/pinprick were assessed and were normal.     Coordination - The patient had normal movements in the hands and feet with no ataxia or dysmetria.  Tremor was absent.   Gait and Station - deferred   ASSESSMENT/PLAN Mr. KIONDRE GRENZ is a 76 y.o. male with history of HTN, DM, CKD4, chronic anemia, LBP, renal cell carcinoma with mets but stable presented to ED for lethargy since last weekend, fell 2 days ago and HA for one day. CT showed subacute left PCA infarct. Pt not TNK candidate due to outside window.   Stroke:  left PCA large infarct and punctate right PCA infarct, embolic pattern, concerning for cardioembolic source CT left PCA infarct MRI  left PCA large infarct and punctate right PCA infarct, MRA  Left PCA P3 branch occlusion, and ICA siphon atherosclerosis with possible tandem significant stenoses of the Right siphon. Carotid Doppler  unremarkable 2D Echo EF 50 to 55% LE venous Doppler no DVT No long-term cardiac monitoring this time as he is not AC candidate given chronic anemia and patient refused dialysis for ESRD LDL 159 HgbA1c pending SCDs for VTE prophylaxis No antithrombotic prior to admission, now on aspirin 81 mg daily and clopidogrel 75 mg daily DAPT for 3 months and then ASA alone given left P3 occlusion.  Patient counseled to be compliant with his antithrombotic medications Ongoing aggressive stroke risk factor management Therapy recommendations:  outpt PT Disposition:  pending.  Educated on no driving for now given hemianopia.  Consider to follow-up with ophthalmology for driving clearance in the future if vision improves  Diabetes HgbA1c  pending goal < 7.0 Controlled CBG monitoring SSI DM education and close PCP follow up  Hypertension Stable Long term BP goal normotensive  Hyperlipidemia Home meds:  lipitor 20 and lopid  LDL 159, goal < 70 Now on crestor 20 and lopid Continue statin at discharge  Other Stroke Risk Factors Advanced age renal cell carcinoma with mets but stable, follows with oncology  Other Active Problems CKD 4 Chronic anemia LBP  Hospital day # 0  Neurology will sign off. Please call with questions. Pt will follow up with Dr. Delice Lesch at [facility] in about 4 weeks. Thanks for the consult.   Rosalin Hawking, MD PhD Stroke Neurology 04/09/2022 11:37 PM    To contact Stroke Continuity provider, please refer to [email or web address] After hours, contact General Neurology

## 2022-04-09 NOTE — Discharge Instructions (Signed)
Please DO NOT drive until you have been cleared by a physician

## 2022-04-09 NOTE — Evaluation (Signed)
Speech Language Pathology Evaluation Patient Details Name: Antonio Adams MRN: [id number] DOB: [birth year] Today's Date: 04/09/2022 Time: 0730-0800 SLP Time Calculation (min) (ACUTE ONLY): 30 min  Problem List:  Patient Active Problem List   Diagnosis Date Noted   Abnormal brain CT 04/07/2022   Encounter for general adult medical examination with abnormal findings 01/23/2022   Anemia due to zinc deficiency 12/20/2021   Low back pain 12/15/2021   Symptomatic anemia 12/15/2021   Cancer related pain 12/15/2021   Chronic heart failure with preserved ejection fraction (HFpEF) ([place]) 08/12/2021   Tinea cruris 08/12/2021   Anemia due to chronic kidney disease 07/20/2021   CKD (chronic kidney disease) stage 5, GFR less than 15 ml/min (HCC) 05/24/2021   BPH (benign prostatic hyperplasia) 05/24/2021   Diabetic renal disease ([place]) 05/20/2021   Dry eye syndrome of bilateral lacrimal glands 05/20/2021   Senile nuclear sclerosis 05/20/2021   Vitamin D deficiency 05/20/2021   Vitreous degeneration, unspecified eye 05/20/2021   Intrinsic eczema 04/18/2021   Atherosclerosis of aorta ([place]) 12/30/2020   Mixed conductive and sensorineural hearing loss of left ear with restricted hearing of right ear 09/01/2020   Tinnitus of both ears 05/20/2020   Metastatic renal cell carcinoma ([place]) 02/26/2020   Diabetic polyneuropathy associated with type 2 diabetes mellitus ([place]) 12/18/2019   Murmur, cardiac 10/08/2018   DDD (degenerative disc disease), cervical 10/08/2018   OAB (overactive bladder) 04/04/2018   Seasonal allergic rhinitis due to pollen 11/01/2016   Microalbuminuria due to type 2 diabetes mellitus ([place]) 07/27/2016   Varicose veins of both lower extremities 08/06/2015   Varicose veins of lower extremities with ulcer ([place]) 06/16/2015   Internal hemorrhoid, bleeding 08/03/2014   Post traumatic stress disorder (PTSD) 01/29/2014   Spinal stenosis of lumbar region with radiculopathy 10/30/2011     Class: Chronic   Obstructive sleep apnea 11/24/2010   Obesity 11/24/2010   Routine general medical examination at a health care facility 11/24/2010   ADENOCARCINOMA, PROSTATE, GLEASON GRADE 6 12/21/2009   Type 2 diabetes mellitus with diabetic cataract ([place]) 03/16/2007   Essential hypertension 03/16/2007   GERD 03/16/2007   Past Medical History:  Past Medical History:  Diagnosis Date   ADENOCARCINOMA, PROSTATE, GLEASON GRADE 6 12/21/2009   prostate cancer   ALLERGIC RHINITIS 03/16/2007   Allergy    Anxiety    Arthritis    back    BACK PAIN WITH RADICULOPATHY 11/23/2008   Cancer of kidney ([place])    partial right kidney removed   CARPAL TUNNEL SYNDROME, BILATERAL 03/16/2007   Chronic back pain    Constipation    takes Senokot daily   Depression    occasionally   DIABETES MELLITUS, TYPE II 03/16/2007   takes Januvia and MEtformin daily   GASTROINTESTINAL HEMORRHAGE, HX OF 03/16/2007   GERD 03/16/2007   takes Omeprazole daily   Glaucoma    mild - no eye drops   Hemorrhoids    History of colon polyps    HYPERLIPIDEMIA 03/16/2007   takes Crestor daily   HYPERTENSION 03/16/2007   takes Diltiazem and Lisinopril daily    Kidney cancer, primary, with metastasis from kidney to other site [facility])    LEG CRAMPS 05/22/2007   Nocturia    Overactive bladder    PARESTHESIA 05/21/2007   Proctitis    PTSD (post-traumatic stress disorder)    wakes up may be dreaming of fighting   Rectal bleeding    Dr.Norins has explained its from the Radiation that he has received  Rectal bleeding    Renal cell carcinoma (HCC)    Sleep apnea    uses CPAP nightly   Stomach cancer ([place])    Urinary frequency    takes Toviaz daily   Past Surgical History:  Past Surgical History:  Procedure Laterality Date   ADRENALECTOMY Right 02/26/2020   Procedure: OPEN RIGHT RENAL CELL METASTATECTOMY AND  ADRENALECTOMY;  Surgeon: [name], MD;  Location: [facility];  Service: Urology;  Laterality: Right;   [place], 2008   repeat surgery; ESI '08   [place]     bilateral   CERVICAL FUSION     COLONOSCOPY     colonosocpy     ESOPHAGOGASTRODUODENOSCOPY     ED   FLEXIBLE SIGMOIDOSCOPY N/A 01/08/2017   Procedure: FLEXIBLE SIGMOIDOSCOPY;  Surgeon: Irene Shipper, MD;  Location: [place];  Service: Endoscopy;  Laterality: N/A;   HOT HEMOSTASIS N/A 01/08/2017   Procedure: HOT HEMOSTASIS (ARGON PLASMA COAGULATION/BICAP);  Surgeon: Irene Shipper, MD;  Location: [facility];  Service: Endoscopy;  Laterality: N/A;   IR RADIOLOGIST EVAL & MGMT  12/17/2019   IR RADIOLOGIST EVAL & MGMT  07/01/2020   IR RADIOLOGIST EVAL & MGMT  08/25/2020   LUMBAR LAMINECTOMY  10/30/2011   Procedure: MICRODISCECTOMY LUMBAR LAMINECTOMY;  Surgeon: Jessy Oto, MD;  Location: [place];  Service: Orthopedics;  Laterality: Right;  Right L4-5 and L5-S1 Microdiscectomy   LUMBAR LAMINECTOMY  06/17/2012   Procedure: MICRODISCECTOMY LUMBAR LAMINECTOMY;  Surgeon: Jessy Oto, MD;  Location: [place];  Service: Orthopedics;  Laterality: N/A;  Right L5-S1 microdiscectomy   OPEN PARTIAL HEPATECTOMY  N/A 02/26/2020   Procedure: OPEN PARTIAL HEPATECTOMY, CHOLECYSTECTOMY;  Surgeon: Stark Klein, MD;  Location: [facility];  Service: General;  Laterality: N/A;   POLYPECTOMY     PROSTATE SURGERY  march 2012   seed implant   RADIOFREQUENCY ABLATION N/A 07/21/2020   Procedure: CT MICROWAVE ABLATION;  Surgeon: Criselda Peaches, MD;  Location: [facility];  Service: Anesthesiology;  Laterality: N/A;   ROBOTIC ASSITED PARTIAL NEPHRECTOMY Right 09/08/2015   Procedure: XI ROBOTIC ASSITED RIGHT PARTIAL NEPHRECTOMY;  Surgeon: [name], MD;  [facility];  Service: Urology;  Laterality: Right;  Clamp on: 1033 Clamp off: 1103 Total Clamp Time: 30 minutes   SHOULDER ARTHROSCOPY WITH SUBACROMIAL DECOMPRESSION Left 01/20/2016   Procedure: LEFT SHOULDER ARTHROSCOPY WITH EXTENSIVE  DEBRIDEMENT, ACROMIOPLASTY;  Surgeon: Ninetta Lights,  MD;  Location: [place];  Service: Orthopedics;  Laterality: Left;   SHOULDER SURGERY  LFT   Stress Cardiolite  12/24/2003   negative for ischemia   UPPER ESOPHAGEAL ENDOSCOPIC ULTRASOUND (EUS)  04/18/2016   [facility]   UPPER GASTROINTESTINAL ENDOSCOPY     WOUND EXPLORATION     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 male with history of HTN, DM, CKD4, chronic anemia, LBP, renal cell carcinoma with mets but stable presented to ED for lethargy since last weekend, fell 2 days ago and HA for one day. CT showed subacute left PCA infarct.   Assessment / Plan / Recommendation Clinical Impression  Pt demonstrates cognitive impiarment in areas of memory and problem solving. Processing slow with pt needing multiple repetitions and often giving up on effortful tasks. Pt is consistently disoriented to place and time. Cannot complete simple math. There is a report of mild memory impairment at  baseline, wife conifrms that current deficits are significant and notable. Pt will need f/u with OP SLP. Provided simple counseling for d/c home with supervision with basic tasks and wife becoming responsible for higher level tasks such as finances. Will sign off in acute level of care    SLP Assessment  SLP Recommendation/Assessment: Patient needs continued Speech Lanaguage Pathology Services SLP Visit Diagnosis: Cognitive communication deficit (R41.841)    Recommendations for follow up therapy are one component of a multi-disciplinary discharge planning process, led by the attending physician.  Recommendations may be updated based on patient status, additional functional criteria and insurance authorization.    Follow Up Recommendations  Outpatient SLP    Assistance Recommended at Discharge  Intermittent Supervision/Assistance  Functional Status Assessment Patient has had a recent decline in their functional status and  demonstrates the ability to make significant improvements in function in a reasonable and predictable amount of time.  Frequency and Duration           SLP Evaluation Cognition  Overall Cognitive Status: Impaired/Different from baseline Arousal/Alertness: Awake/alert Orientation Level: Oriented to person;Disoriented to place;Disoriented to time;Disoriented to situation Attention: Focused;Sustained Focused Attention: Appears intact Sustained Attention: Appears intact Memory: Impaired Memory Impairment: Storage deficit;Retrieval deficit Awareness: Impaired Awareness Impairment: Intellectual impairment;Emergent impairment Problem Solving: Impaired Problem Solving Impairment: Verbal basic;Functional basic Executive Function: Reasoning;Self Monitoring;Self Correcting Reasoning: Impaired Reasoning Impairment: Verbal basic;Functional basic Self Monitoring: Impaired Self Monitoring Impairment: Verbal basic;Functional basic Safety/Judgment: Impaired       Comprehension  Auditory Comprehension Overall Auditory Comprehension: Impaired Yes/No Questions: Within Functional Limits Commands: Impaired Multistep Basic Commands: 50-74% accurate Conversation: Simple    Expression Verbal Expression Overall Verbal Expression: Appears within functional limits for tasks assessed   Oral / Motor  Oral Motor/Sensory Function Overall Oral Motor/Sensory Function: Within functional limits Motor Speech Overall Motor Speech: Appears within functional limits for tasks assessed            Jamaurie Bernier, Katherene Ponto 04/09/2022, 8:52 AM

## 2022-04-10 ENCOUNTER — Ambulatory Visit (HOSPITAL_COMMUNITY)
Admission: RE | Admit: 2022-04-10 | Discharge: 2022-04-10 | Disposition: A | Discharge status: Home / Self Care | Payer: Medicare Other | Source: Ambulatory Visit | Attending: Nephrology | Admitting: Nephrology

## 2022-04-10 VITALS — BP 141/86 | HR 88 | Temp 98.2°F

## 2022-04-10 DIAGNOSIS — N185 Chronic kidney disease, stage 5: Secondary | ICD-10-CM | POA: Insufficient documentation

## 2022-04-10 LAB — HEMOGLOBIN A1C
Hgb A1c MFr Bld: 5.4 % (ref 4.8–5.6)
Mean Plasma Glucose: 108 mg/dL

## 2022-04-10 LAB — IRON AND TIBC
Iron: 94 ug/dL (ref 45–182)
Saturation Ratios: 36 % (ref 17.9–39.5)
TIBC: 265 ug/dL (ref 250–450)
UIBC: 171 ug/dL

## 2022-04-10 LAB — FERRITIN: Ferritin: 1376 ng/mL — ABNORMAL HIGH (ref 24–336)

## 2022-04-10 LAB — POCT HEMOGLOBIN-HEMACUE: Hemoglobin: 8.1 g/dL — ABNORMAL LOW (ref 13.0–17.0)

## 2022-04-10 MED ORDER — EPOETIN ALFA-EPBX 40000 UNIT/ML IJ SOLN
30000.0000 [IU] | INTRAMUSCULAR | Status: DC
  Administered 2022-04-10: 30000 [IU] via SUBCUTANEOUS

## 2022-04-10 MED ORDER — EPOETIN ALFA-EPBX 40000 UNIT/ML IJ SOLN
INTRAMUSCULAR | Status: AC
  Filled 2022-04-10: qty 1

## 2022-04-11 NOTE — Progress Notes (Addendum)
[place] [facility] Clinic Note - Initial Visit   Date: 04/12/22  Antonio Adams MRN: [id number] DOB: December 20, 1945   Dear Dr Yetta Barre, Bernadene Bell, MD:  Thank you for your kind referral of Antonio Adams for consultation of recent L PCA stroke. Although his Antonio is well known to you, please allow [place] to reiterate it for the purpose of our medical record. The patient was accompanied to the clinic by his fiancee who also provides collateral information.     Antonio of Present Illness: Antonio Adams is a 76 y.o. R-handed male with Antonio of DM2 with diabetic Adams, Antonio Adams, Antonio Adams, Antonio Adams, Antonio Adams, Antonio Adams, Antonio Adams May 2023, only on surveillance imaging, stage IV, eczema, vitamin D deficiency, CKD stage V refusing HD, BPH, anemia of CKD  Zinc deficiency, chronic heart failure, mild cognitive impairment, presenting for evaluation of left PCA territory acute CVA.  In review, the patient presented to the ED on 04/07/22 with several day Antonio of lethargy with fall 2 days prior to presentation, as well as 1 day Antonio of mild left frontal temporal dull headache initially felt to be due to anemia.  He denied any  unilateral weakness, numbness, or speech difficulty. Although he denied any vision changes, it was documented that the patient had hemianopia (he denies during this visit) .Patient  never had a similar episode. Denies any Antonio of TIA. Denies vertigo or dizziness. Denies headaches during this visit, dysarthria or dysphagia.  He has a Antonio of MCI, Antonio patient denies any significant memory issues.  Denies any chest Adams, or shortness of breath. Denies any fever or chills, or night sweats. No tobacco. No new meds or hormonal  supplements. He is on Plavix for 3 months Antonio ASA, then ASA alone. Denies any recent long distance trips or recent surgeries. No sick contacts. No new stressors present in personal life. Patient is compliant with his Adams. Patient is Antonio very active. No family Antonio of stroke. Patient was Antonio administered TPA  as is beyond time window for treatment consideration.     Stroke workup at the hospital on 04/03/2022, personally reviewed is remarkable for: MRI of the brain showing a left PCA large infarct Antonio punctate right PCA infarct MRA remarkable for left PCA P3 branch occlusion, ICA siphon atherosclerosis with possible tandem significant stenosis of the right siphon Carotid Dopplers unremarkable 2D echo EF 50 to 55% Lower extremity venous Doppler without DVT LDL 159     04/12/2022    0:86 AM 08/02/2017    3:19 PM 07/07/2015   12:08 PM  MMSE - Mini Mental State Exam  Orientation to time 3 5 5   Orientation to Place 5 5 5   Registration 3 3 3   Attention/ Calculation 3 4 1   Recall 1 2 2   Language- name 2 objects 2 2 2   Language- repeat 1 1 1   Language- follow 3 step command 3 3 3   Language- read & follow direction 1 1 1   Write a sentence 1 1 1   Copy design 1 1 1   Total score 24 28 25      Out-side paper records, electronic medical record, Antonio images have been reviewed where available Antonio summarized as:  Lab Results  Component Value Date   HGBA1C 5.4 04/08/2022   Lab Results  Component  Value Date   VITAMINB12 789 12/15/2021   Lab Results  Component Value Date   TSH 2.577 10/04/2021   No results found for: "ESRSEDRATE", "POCTSEDRATE"  Past Medical Antonio:  Diagnosis Date   ADENOCARCINOMA, PROSTATE, GLEASON GRADE 6 12/21/2009   prostate cancer   ALLERGIC RHINITIS 03/16/2007   Allergy    Anxiety    Arthritis    back    BACK Adams WITH RADICULOPATHY 11/23/2008   Cancer of kidney (HCC)    partial right kidney removed   CARPAL TUNNEL SYNDROME, BILATERAL 03/16/2007    Chronic back Adams    Constipation    takes Senokot daily   Depression    occasionally   DIABETES MELLITUS, TYPE II 03/16/2007   takes Januvia Antonio MEtformin daily   GASTROINTESTINAL HEMORRHAGE, HX OF 03/16/2007   GERD 03/16/2007   takes Omeprazole daily   Glaucoma    mild - no eye drops   Hemorrhoids    Antonio of colon polyps    HYPERLIPIDEMIA 03/16/2007   takes Crestor daily   HYPERTENSION 03/16/2007   takes Diltiazem Antonio Lisinopril daily    Kidney cancer, primary, with metastasis from kidney to other site [facility])    LEG CRAMPS 05/22/2007   Nocturia    Overactive bladder    PARESTHESIA 05/21/2007   Proctitis    Antonio Adams (post-traumatic stress disorder)    wakes up may be dreaming of fighting   Rectal bleeding    Dr.Norins has explained its from the Radiation that he has received   Rectal bleeding    Renal cell carcinoma (HCC)    Sleep apnea    uses Adams nightly   Stomach cancer (HCC)    Urinary frequency    takes Toviaz daily    Past Surgical Antonio:  Procedure Laterality Date   ADRENALECTOMY Right 02/26/2020   Procedure: OPEN RIGHT RENAL CELL METASTATECTOMY Antonio  ADRENALECTOMY;  Surgeon: Crist Fat, MD;  Location: [facility];  Service: Urology;  Laterality: Right;   BACK SURGERY  1983, 2008   repeat surgery; ESI '08   CARPAL TUNNEL RELEASE     bilateral   CERVICAL FUSION     COLONOSCOPY     colonosocpy     ESOPHAGOGASTRODUODENOSCOPY     ED   FLEXIBLE SIGMOIDOSCOPY N/A 01/08/2017   Procedure: FLEXIBLE SIGMOIDOSCOPY;  Surgeon: Hilarie Fredrickson, MD;  Location: [facility];  Service: Endoscopy;  Laterality: N/A;   HOT HEMOSTASIS N/A 01/08/2017   Procedure: HOT HEMOSTASIS (ARGON PLASMA COAGULATION/BICAP);  Surgeon: Hilarie Fredrickson, MD;  Location: [facility]COPY;  Service: Endoscopy;  Laterality: N/A;   IR RADIOLOGIST EVAL & MGMT  12/17/2019   IR RADIOLOGIST EVAL & MGMT  2/17/Adams   IR RADIOLOGIST EVAL & MGMT  4/13/Adams   LUMBAR LAMINECTOMY  10/30/2011   Procedure: MICRODISCECTOMY  LUMBAR LAMINECTOMY;  Surgeon: Kerrin Champagne, MD;  Location: [facility] OR;  Service: Orthopedics;  Laterality: Right;  Right L4-5 Antonio L5-S1 Microdiscectomy   LUMBAR LAMINECTOMY  06/17/2012   Procedure: MICRODISCECTOMY LUMBAR LAMINECTOMY;  Surgeon: Kerrin Champagne, MD;  Location: [facility] OR;  Service: Orthopedics;  Laterality: N/A;  Right L5-S1 microdiscectomy   OPEN PARTIAL HEPATECTOMY  N/A 02/26/2020   Procedure: OPEN PARTIAL HEPATECTOMY, CHOLECYSTECTOMY;  Surgeon: Almond Lint, MD;  Location: [facility];  Service: General;  Laterality: N/A;   POLYPECTOMY     PROSTATE SURGERY  march 2012   seed implant   RADIOFREQUENCY ABLATION N/A 3/9/Adams   Procedure: CT MICROWAVE ABLATION;  Surgeon: Archer Asa,  Kandis Cocking, MD;  Location: [facility];  Service: Anesthesiology;  Laterality: N/A;   ROBOTIC ASSITED PARTIAL NEPHRECTOMY Right 09/08/2015   Procedure: XI ROBOTIC ASSITED RIGHT PARTIAL NEPHRECTOMY;  Surgeon: Crist Fat, MD;  Location: [facility];  Service: Urology;  Laterality: Right;  Clamp on: 1033 Clamp off: 1103 Total Clamp Time: 30 minutes   SHOULDER ARTHROSCOPY WITH SUBACROMIAL DECOMPRESSION Left 01/20/2016   Procedure: LEFT SHOULDER ARTHROSCOPY WITH EXTENSIVE  DEBRIDEMENT, ACROMIOPLASTY;  Surgeon: Loreta Ave, MD;  Location: [facility];  Service: Orthopedics;  Laterality: Left;   SHOULDER SURGERY  LFT   Stress Cardiolite  12/24/2003   negative for ischemia   UPPER ESOPHAGEAL ENDOSCOPIC ULTRASOUND (EUS)  04/18/2016   [facility]   UPPER GASTROINTESTINAL ENDOSCOPY     WOUND EXPLORATION     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    Apoaequorin (PREVAGEN EXTRA STRENGTH PO) Take 1 tablet by mouth daily.    aspirin EC 81 MG tablet Take 1 tablet (81 mg total) by mouth daily. Swallow  whole.    calcitRIOL (ROCALTROL) 0.25 MCG capsule Take 0.25 mcg by mouth daily.    clopidogrel (PLAVIX) 75 MG tablet Take 1 tablet (75 mg total) by mouth daily. Take Plavix along with aspirin for 3 months, after 3 months stop Plavix Antonio just continue on aspirin.    Cyanocobalamin (B-12 PO) Take 1,000 mcg by mouth daily.    cyclobenzaprine (FLEXERIL) 5 MG tablet Take 1 tablet (5 mg total) by mouth 2 (two) times daily as needed for muscle spasms.    ferrous sulfate 325 (65 FE) MG tablet Take 1 tablet (325 mg total) by mouth 2 (two) times daily. Patient needs office visit for further refills (Patient taking differently: Take 325 mg by mouth daily with breakfast.)    fluocinonide-emollient (LIDEX-E) 0.05 % cream Apply 1 Application topically 2 (two) times daily as needed (dry skin).    furosemide (LASIX) 40 MG tablet TAKE 1 TABLET BY MOUTH EVERY DAY (Patient taking differently: Take 40 mg by mouth in the morning, at noon, Antonio at bedtime.)    gemfibrozil (LOPID) 600 MG tablet Take 600 mg by mouth 2 (two) times daily before a meal.    LOKELMA 10 g PACK packet Take 10 g by mouth daily.    megestrol (MEGACE) 40 MG/ML suspension TAKE 10 MLS (400 MG TOTAL) BY MOUTH DAILY. (Patient taking differently: Take 400 mg by mouth daily.) 04/07/2022: Needs RF   metoprolol tartrate (LOPRESSOR) 25 MG tablet Take 1 tablet (25 mg total) by mouth 2 (two) times daily.    Omega-3 Fatty Acids (FISH OIL PO) Take 1,000 mg by mouth 2 (two) times a week.    OVER THE COUNTER MEDICATION Take 1 tablet by mouth daily. Focus factor    pantoprazole (PROTONIX) 20 MG tablet Take 1 tablet (20 mg total) by mouth daily. (Patient taking differently: Take 20 mg by mouth daily as needed for heartburn or indigestion.)    polyvinyl alcohol (LIQUIFILM TEARS) 1.4 % ophthalmic solution Place 1 drop into both eyes daily as needed for dry eyes.    rosuvastatin (CRESTOR) 20 MG tablet Take 1 tablet (20 mg total) by mouth daily.    sodium bicarbonate 650  MG tablet Take 2 tablets (  1,300 mg total) by mouth 2 (two) times daily.    solifenacin (VESICARE) 5 MG tablet TAKE 1 TABLET (5 MG TOTAL) BY MOUTH DAILY.    tamsulosin (FLOMAX) 0.4 MG CAPS capsule TAKE 1 CAPSULE (0.4 MG TOTAL) BY MOUTH DAILY. (Patient taking differently: Take 0.4 mg by mouth daily.)    zinc gluconate 50 MG tablet Take 1 tablet (50 mg total) by mouth daily.    No facility-administered encounter Adams on file as of 04/12/2022.    Allergies:  Allergies  Allergen Reactions   Ace Inhibitors Swelling    lisinopril   Xyzal [Levocetirizine] Other (See Comments)    Break out   Opdivo [Nivolumab] Rash    Family Antonio: Family Antonio  Problem Relation Age of Onset   Cancer Sister 30       Cancer, unsure type   Hypertension Other    Colon cancer Neg Hx    Esophageal cancer Neg Hx    Stomach cancer Neg Hx    Diabetes Neg Hx    Colon polyps Neg Hx    Rectal cancer Neg Hx     Social Antonio: Social Antonio   Tobacco Use   Smoking status: Never   Smokeless tobacco: Never  Vaping Use   Vaping Use: Never used  Substance Use Topics   Alcohol use: Antonio Currently    Alcohol/week: 0.0 standard drinks of alcohol   Drug use: No   Social Antonio   Social Antonio Narrative   Married-divorced '89, remarried '96. 2 sons, 1 daughter. 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 He wishes to be a full resuscitation candidate.        Vital Signs:  BP 139/79   Pulse 83   Resp 20   Ht 6\' 3"  (1.905 m)   Wt 191 lb (86.6 kg)   SpO2 95%   BMI 23.87 kg/m    General Medical Exam:   General:  Well appearing, comfortable.   Eyes/ENT: see cranial nerve examination.   Neck:   No carotid bruits. Respiratory:  Clear to auscultation, good air entry bilaterally.   Cardiac:  Regular rate Antonio rhythm, no murmur.   Extremities:  No deformities, edema, or skin discoloration.  Skin:  No rashes or lesions.  Neurological  Exam: MENTAL STATUS including orientation to  place, person, remote memory, attention span Antonio concentration, language, Antonio fund of knowledge is normal. Orientation to  time, recent memory is impaired  Speech is Antonio dysarthric. MMSE today is 24/30  CRANIAL NERVES: II:  No visual field defects. Fundi Antonio visualized III-IV-VI: No hemianopsia noted today, mild right ptosis, chronic. No nystagmus V:  Normal facial sensation.    VII:  Normal facial symmetry Antonio movements.   VIII:  Normal hearing Antonio vestibular function.   IX-X:  Normal palatal movement.   XI:  Normal shoulder shrug Antonio head rotation.   XII:  Normal tongue strength Antonio range of motion, no deviation or fasciculation.  MOTOR:  No atrophy, fasciculations or abnormal movements.  No pronator drift.    SENSORY:  Normal Antonio symmetric perception of light touch, pinprick, vibration, Antonio proprioception.  No Romberg sign  COORDINATION/GAIT: Normal finger-to- nose-finger Antonio heel-to-shin.  Intact rapid alternating movements bilaterally.  Able to rise from a chair without using arms.  Gait narrow based Antonio stable leans to the L due to Knee arthritis. Uses a cane for stability . Tandem Antonio stressed gait intact.    PLAN:  LPCA CVA  Continue ASA Antonio Plavix for 3 months, then ASA alone Antonio follow closely with nephrology Recommend good control of cardiovascular risk factors.  Follow with Cards Continue PT OT as outpatient Return to clinic in 6 months.   Memory Impairment Stable, MMSE today 25/30 post hospitalization  Patient Antonio interested in pursuing memory issues at this time.   Total time spent:  45 min   Thank you for allowing me to participate in patient's care.  If I can answer any additional questions, I would be pleased to do so.    Sincerely,   Marlowe Kays, PA-C

## 2022-04-12 ENCOUNTER — Ambulatory Visit: Payer: Medicare Other | Admitting: Physician Assistant

## 2022-04-12 ENCOUNTER — Encounter: Payer: Self-pay | Admitting: Physician Assistant

## 2022-04-12 VITALS — BP 139/79 | HR 83 | Resp 20 | Ht 75.0 in | Wt 191.0 lb

## 2022-04-12 DIAGNOSIS — I63532 Cerebral infarction due to unspecified occlusion or stenosis of left posterior cerebral artery: Secondary | ICD-10-CM

## 2022-04-12 DIAGNOSIS — R413 Other amnesia: Secondary | ICD-10-CM | POA: Diagnosis not present

## 2022-04-12 NOTE — Patient Instructions (Addendum)
It was a pleasure to see you today at our office.   Recommendations:  Follow up in  6 months Continue the medicines Plavix and Aspiring as directed  Recommend PT  Whom to call:  If you have any severe symptoms of a stroke, or other severe issues such as confusion,severe chills or fever, etc call 911 or go to the ER as you may need to be evaluated further          RECOMMENDATIONS FOR ALL PATIENTS WITH MEMORY PROBLEMS: 1. Continue to exercise (Recommend 30 minutes of walking everyday, or 3 hours every week) 2. Increase social interactions - continue going to [place] and enjoy social gatherings with friends and family 3. Eat healthy, avoid fried foods and eat more fruits and vegetables 4. Maintain adequate blood pressure, blood sugar, and blood cholesterol level. Reducing the risk of stroke and cardiovascular disease also helps promoting better memory. 5. Avoid stressful situations. Live a simple life and avoid aggravations. Organize your time and prepare for the next day in anticipation. 6. Sleep well, avoid any interruptions of sleep and avoid any distractions in the bedroom that may interfere with adequate sleep quality 7. Avoid sugar, avoid sweets as there is a strong link between excessive sugar intake, diabetes, and cognitive impairment We discussed the Mediterranean diet, which has been shown to help patients reduce the risk of progressive memory disorders and reduces cardiovascular risk. This includes eating fish, eat fruits and green leafy vegetables, nuts like almonds and hazelnuts, walnuts, and also use olive oil. Avoid fast foods and fried foods as much as possible. Avoid sweets and sugar as sugar use has been linked to worsening of memory function.    DRIVING: Regarding driving, in patients with progressive memory problems, driving will be impaired. We advise to have someone else do the driving if trouble finding directions or if minor accidents are reported. Independent driving  assessment is available to determine safety of driving.   If you are interested in the driving assessment, you can contact the following:  [employer] in [place]  [place] [phone]  [place]  [facility] [phone] or [phone]

## 2022-04-14 ENCOUNTER — Inpatient Hospital Stay: Payer: Medicare Other | Attending: Oncology

## 2022-04-14 ENCOUNTER — Other Ambulatory Visit: Payer: Self-pay

## 2022-04-14 ENCOUNTER — Telehealth: Payer: Self-pay

## 2022-04-14 ENCOUNTER — Ambulatory Visit (HOSPITAL_COMMUNITY)
Admission: RE | Admit: 2022-04-14 | Discharge: 2022-04-14 | Disposition: A | Discharge status: Home / Self Care | Payer: Medicare Other | Source: Ambulatory Visit | Attending: Oncology | Admitting: Oncology

## 2022-04-14 DIAGNOSIS — D630 Anemia in neoplastic disease: Secondary | ICD-10-CM | POA: Insufficient documentation

## 2022-04-14 DIAGNOSIS — C641 Malignant neoplasm of right kidney, except renal pelvis: Secondary | ICD-10-CM | POA: Insufficient documentation

## 2022-04-14 DIAGNOSIS — C787 Secondary malignant neoplasm of liver and intrahepatic bile duct: Secondary | ICD-10-CM | POA: Diagnosis not present

## 2022-04-14 DIAGNOSIS — Z8673 Personal history of transient ischemic attack (TIA), and cerebral infarction without residual deficits: Secondary | ICD-10-CM | POA: Diagnosis not present

## 2022-04-14 DIAGNOSIS — K7689 Other specified diseases of liver: Secondary | ICD-10-CM | POA: Diagnosis not present

## 2022-04-14 DIAGNOSIS — N19 Unspecified kidney failure: Secondary | ICD-10-CM | POA: Diagnosis not present

## 2022-04-14 DIAGNOSIS — N2889 Other specified disorders of kidney and ureter: Secondary | ICD-10-CM | POA: Diagnosis not present

## 2022-04-14 DIAGNOSIS — Z905 Acquired absence of kidney: Secondary | ICD-10-CM | POA: Diagnosis not present

## 2022-04-14 DIAGNOSIS — Z7982 Long term (current) use of aspirin: Secondary | ICD-10-CM | POA: Insufficient documentation

## 2022-04-14 DIAGNOSIS — Z79899 Other long term (current) drug therapy: Secondary | ICD-10-CM | POA: Diagnosis not present

## 2022-04-14 DIAGNOSIS — R911 Solitary pulmonary nodule: Secondary | ICD-10-CM | POA: Diagnosis not present

## 2022-04-14 LAB — CBC WITH DIFFERENTIAL (CANCER CENTER ONLY)
Abs Immature Granulocytes: 0 10*3/uL (ref 0.00–0.07)
Basophils Absolute: 0 10*3/uL (ref 0.0–0.1)
Basophils Relative: 1 %
Eosinophils Absolute: 0.1 10*3/uL (ref 0.0–0.5)
Eosinophils Relative: 2 %
HCT: 21.9 % — ABNORMAL LOW (ref 39.0–52.0)
Hemoglobin: 7.7 g/dL — ABNORMAL LOW (ref 13.0–17.0)
Immature Granulocytes: 0 %
Lymphocytes Relative: 20 %
Lymphs Abs: 0.8 10*3/uL (ref 0.7–4.0)
MCH: 32.6 pg (ref 26.0–34.0)
MCHC: 35.2 g/dL (ref 30.0–36.0)
MCV: 92.8 fL (ref 80.0–100.0)
Monocytes Absolute: 0.6 10*3/uL (ref 0.1–1.0)
Monocytes Relative: 16 %
Neutro Abs: 2.4 10*3/uL (ref 1.7–7.7)
Neutrophils Relative %: 61 %
Platelet Count: 196 10*3/uL (ref 150–400)
RBC: 2.36 MIL/uL — ABNORMAL LOW (ref 4.22–5.81)
RDW: 15.9 % — ABNORMAL HIGH (ref 11.5–15.5)
WBC Count: 3.9 10*3/uL — ABNORMAL LOW (ref 4.0–10.5)
nRBC: 1 % — ABNORMAL HIGH (ref 0.0–0.2)

## 2022-04-14 LAB — CMP (CANCER CENTER ONLY)
ALT: 13 U/L (ref 0–44)
AST: 19 U/L (ref 15–41)
Albumin: 3.9 g/dL (ref 3.5–5.0)
Alkaline Phosphatase: 68 U/L (ref 38–126)
Anion gap: 12 (ref 5–15)
BUN: 45 mg/dL — ABNORMAL HIGH (ref 8–23)
CO2: 19 mmol/L — ABNORMAL LOW (ref 22–32)
Calcium: 10 mg/dL (ref 8.9–10.3)
Chloride: 108 mmol/L (ref 98–111)
Creatinine: 7.28 mg/dL (ref 0.61–1.24)
GFR, Estimated: 7 mL/min — ABNORMAL LOW (ref >60)
Glucose, Bld: 110 mg/dL — ABNORMAL HIGH (ref 70–99)
Potassium: 3.5 mmol/L (ref 3.5–5.1)
Sodium: 139 mmol/L (ref 135–145)
Total Bilirubin: 0.3 mg/dL (ref 0.3–1.2)
Total Protein: 6.9 g/dL (ref 6.5–8.1)

## 2022-04-14 NOTE — Telephone Encounter (Signed)
CRITICAL VALUE STICKER  CRITICAL VALUE: CREATININE 7.28  RECEIVER (on-site recipient of call): Kiesha Ensey P. LPN  DATE & TIME NOTIFIED: 04/14/22 3:36 pm  MESSENGER (representative from lab): Rosann Auerbach   MD NOTIFIED: Dr. Alen Blew

## 2022-04-20 ENCOUNTER — Other Ambulatory Visit: Payer: Self-pay

## 2022-04-20 ENCOUNTER — Inpatient Hospital Stay (HOSPITAL_BASED_OUTPATIENT_CLINIC_OR_DEPARTMENT_OTHER): Payer: Medicare Other | Admitting: Oncology

## 2022-04-20 VITALS — BP 174/86 | HR 71 | Temp 97.3°F | Resp 18 | Wt 206.5 lb

## 2022-04-20 DIAGNOSIS — Z79899 Other long term (current) drug therapy: Secondary | ICD-10-CM | POA: Diagnosis not present

## 2022-04-20 DIAGNOSIS — Z905 Acquired absence of kidney: Secondary | ICD-10-CM | POA: Diagnosis not present

## 2022-04-20 DIAGNOSIS — N19 Unspecified kidney failure: Secondary | ICD-10-CM | POA: Diagnosis not present

## 2022-04-20 DIAGNOSIS — D630 Anemia in neoplastic disease: Secondary | ICD-10-CM | POA: Diagnosis not present

## 2022-04-20 DIAGNOSIS — C641 Malignant neoplasm of right kidney, except renal pelvis: Secondary | ICD-10-CM | POA: Diagnosis not present

## 2022-04-20 DIAGNOSIS — Z7982 Long term (current) use of aspirin: Secondary | ICD-10-CM | POA: Diagnosis not present

## 2022-04-20 DIAGNOSIS — C787 Secondary malignant neoplasm of liver and intrahepatic bile duct: Secondary | ICD-10-CM | POA: Diagnosis not present

## 2022-04-20 DIAGNOSIS — Z8673 Personal history of transient ischemic attack (TIA), and cerebral infarction without residual deficits: Secondary | ICD-10-CM | POA: Diagnosis not present

## 2022-04-20 NOTE — Progress Notes (Signed)
Hematology and Oncology Follow Up Visit  Antonio Adams [id number] 1945/06/16 76 y.o. 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  He initially presented in 2017 with localized disease.  Prior Therapy:  He underwent right partial nephrectomy and found to have stage T1b 6 cm papillary tumor in 2017.  He completed right adrenalectomy and metastatic resection of a recurrent kidney tumor right retroperitoneum completed in October 2021 and care of Dr. Louis Meckel.  The final pathology at that time confirmed the presence of papillary renal cell carcinoma.   He developed recurrent disease in March 2022 after recurrence including the adrenal metastasis as well as hepatic involvement.  He underwent percutaneous microwave ablation of the adrenal gland as well as hepatic metastasis completed in March 2022 under the care of Dr. Laurence Ferrari.   Nivolumab 480 mg every 4 weeks started on February 22, 2021.  He completed 2 cycles of therapy in November 2022.  Therapy interrupted due to patient's preference.  Current therapy: Active surveillance   Interim History: Antonio Adams returns today for a follow-up visit.  Since the last visit, he had recurrent hospitalizations with the most recent one was on November 26 due to CVA after presenting with syncope and fall.  Since his discharge, he had reports of feeling reasonably well without any major complaints.  He denies any nausea, vomiting or abdominal pain.  He denies any dizziness or lightheadedness.  Denies any falls or syncope.  His weight and appetite remain stable.    Medications: Updated on review. Current Outpatient Medications  Medication Sig Dispense Refill   acetaminophen (TYLENOL) 500 MG tablet Take 1,000 mg by mouth every 6 (six) hours as needed for mild pain.     Apoaequorin (PREVAGEN EXTRA STRENGTH PO) Take 1  tablet by mouth daily.     aspirin EC 81 MG tablet Take 1 tablet (81 mg total) by mouth daily. Swallow whole. 30 tablet 12   calcitRIOL (ROCALTROL) 0.25 MCG capsule Take 0.25 mcg by mouth daily.     clopidogrel (PLAVIX) 75 MG tablet Take 1 tablet (75 mg total) by mouth daily. Take Plavix along with aspirin for 3 months, after 3 months stop Plavix and just continue on aspirin. 30 tablet 2   Cyanocobalamin (B-12 PO) Take 1,000 mcg by mouth daily.     cyclobenzaprine (FLEXERIL) 5 MG tablet Take 1 tablet (5 mg total) by mouth 2 (two) times daily as needed for muscle spasms. 6 tablet 0   ferrous sulfate 325 (65 FE) MG tablet Take 1 tablet (325 mg total) by mouth 2 (two) times daily. Patient needs office visit for further refills (Patient taking differently: Take 325 mg by mouth daily with breakfast.) 60 tablet 1   fluocinonide-emollient (LIDEX-E) 0.05 % cream Apply 1 Application topically 2 (two) times daily as needed (dry skin).     furosemide (LASIX) 40 MG tablet TAKE 1 TABLET BY MOUTH EVERY DAY (Patient taking differently: Take 40 mg by mouth in the morning, at noon, and at bedtime.) 90 tablet 1   gemfibrozil (LOPID) 600 MG tablet Take 600 mg by mouth 2 (two) times daily before a meal.     LOKELMA 10 g PACK packet Take 10 g by mouth daily.     megestrol (MEGACE) 40 MG/ML suspension TAKE 10 MLS (400 MG TOTAL) BY MOUTH DAILY. (Patient taking differently: Take 400 mg by mouth daily.) 240 mL  0   metoprolol tartrate (LOPRESSOR) 25 MG tablet Take 1 tablet (25 mg total) by mouth 2 (two) times daily. 180 tablet 3   Omega-3 Fatty Acids (FISH OIL PO) Take 1,000 mg by mouth 2 (two) times a week.     OVER THE COUNTER MEDICATION Take 1 tablet by mouth daily. Focus factor     pantoprazole (PROTONIX) 20 MG tablet Take 1 tablet (20 mg total) by mouth daily. (Patient taking differently: Take 20 mg by mouth daily as needed for heartburn or indigestion.) 30 tablet 0   polyvinyl alcohol (LIQUIFILM TEARS) 1.4 % ophthalmic  solution Place 1 drop into both eyes daily as needed for dry eyes.     rosuvastatin (CRESTOR) 20 MG tablet Take 1 tablet (20 mg total) by mouth daily. 30 tablet 1   sodium bicarbonate 650 MG tablet Take 2 tablets (1,300 mg total) by mouth 2 (two) times daily. 60 tablet 1   solifenacin (VESICARE) 5 MG tablet TAKE 1 TABLET (5 MG TOTAL) BY MOUTH DAILY. 90 tablet 1   tamsulosin (FLOMAX) 0.4 MG CAPS capsule TAKE 1 CAPSULE (0.4 MG TOTAL) BY MOUTH DAILY. (Patient taking differently: Take 0.4 mg by mouth daily.) 30 capsule 1   zinc gluconate 50 MG tablet Take 1 tablet (50 mg total) by mouth daily. 90 tablet 1   No current facility-administered medications for this visit.     Allergies:  Allergies  Allergen Reactions   Ace Inhibitors Swelling    lisinopril   Xyzal [Levocetirizine] Other (See Comments)    Break out   Opdivo [Nivolumab] Rash     Physical Exam:  Blood pressure (!) 174/86, pulse 71, temperature (!) 97.3 F (36.3 C), temperature source Temporal, resp. rate 18, weight 206 lb 8 oz (93.7 kg), SpO2 100 %.    ECOG: 1   General appearance: Alert, awake without any distress. Head: Atraumatic without abnormalities Oropharynx: Without any thrush or ulcers. Eyes: No scleral icterus. Lymph nodes: No lymphadenopathy noted in the cervical, supraclavicular, or axillary nodes Heart:regular rate and rhythm, without any murmurs or gallops.   Lung: Clear to auscultation without any rhonchi, wheezes or dullness to percussion. Abdomin: Soft, nontender without any shifting dullness or ascites. Musculoskeletal: No clubbing or cyanosis. Neurological: No motor or sensory deficits. Skin: No rashes or lesions.       Lab Results: Lab Results  Component Value Date   WBC 3.9 (L) 04/14/2022   HGB 7.7 (L) 04/14/2022   HCT 21.9 (L) 04/14/2022   MCV 92.8 04/14/2022   PLT 196 04/14/2022   PSA 0.01 (L) 05/20/2020     Chemistry      Component Value Date/Time   NA 139 04/14/2022 1501    NA 140 04/05/2022 0000   K 3.5 04/14/2022 1501   CL 108 04/14/2022 1501   CO2 19 (L) 04/14/2022 1501   BUN 45 (H) 04/14/2022 1501   BUN 42 (A) 04/05/2022 0000   CREATININE 7.28 (HH) 04/14/2022 1501   CREATININE 2.11 (H) 12/18/2019 1425   GLU 97 04/05/2022 0000      Component Value Date/Time   CALCIUM 10.0 04/14/2022 1501   ALKPHOS 68 04/14/2022 1501   AST 19 04/14/2022 1501   ALT 13 04/14/2022 1501   BILITOT 0.3 04/14/2022 1501     IMPRESSION: 1. Interval enlargement of bulky left periaortic retroperitoneal lymph node, consistent with worsened nodal metastatic disease. 2. New, adjacent hypodense lesions of the posterior right lobe of the liver, consistent with hepatic metastatic disease. 3.  Continued enlargement of a lytic inferior endplate lesion of the L3 vertebral body. Although location is generally suggestive of a bland Schmorl endplate deformity, continued growth and lack of adjacent sclerosis is highly suspicious for an osseous metastasis. 4. Unchanged noncontrast postoperative/post ablation appearance of the superior pole and midportion of the right kidney. 5. There is a 0.4 cm nodule of the lateral segment right middle lobe; this vicinity was obscured by breath motion artifact on most recent examination dated 10/04/2021 but is not significantly changed in comparison to more remote examination dated 10/12/2020; this nodule is presumed benign. Attention on follow-up. 6. Gross enlargement of the main pulmonary artery, as can be seen in pulmonary hypertension. 7. Coronary artery disease. 8. Aortic valve calcifications. Correlate for echocardiographic evidence of aortic valve dysfunction.   Aortic Atherosclerosis (ICD10-I70.0).     Impression and Plan:   35 year old with:  1.  Kidney cancer diagnosed in 2017.  He is subsequently found to have stage IV papillary renal cell carcinoma with with lymphadenopathy and hepatic involvement.  The natural of his disease  and treatment choices were reviewed at this time.  CT scan obtained on April 14, 2022 showed progression of disease with lymphadenopathy and hepatic involvement.  He is not a good candidate for any aggressive anticancer treatment at this time.  He has been on immunotherapy with poor tolerance and very little response.  Given the recent progression of disease and his poor health I recommended supportive management only and potentially transitioning to hospice if further decline is detected.    2.  Renal failure: He continues to follow with nephrology and has not had any recent dialysis.  Kidney function and electrolytes appear stable.  3.  Anemia: Due to renal failure and malignancy.  No need for transfusion at this time.   4.  CVA: He has follow-up with neurology and currently on aspirin and Plavix.   5.  Follow-up: In 6 months for repeat follow-up.   30  minutes were spent on this visit.  The time was dedicated to updating his disease status, reviewing imaging studies and outlining future plan of care review.     Zola Button, MD 12/7/20232:49 PM

## 2022-04-23 IMAGING — CR DG CHEST 2V
2 series · 2 of 2 positions shown · non-contrast
Comparison: Chest x-ray 07/22/2020, CT chest 10/12/2020

CLINICAL DATA: sob, prostate cancer, renal cancer, gastric cancer

EXAM:
CHEST - 2 VIEW

[w chest pa]
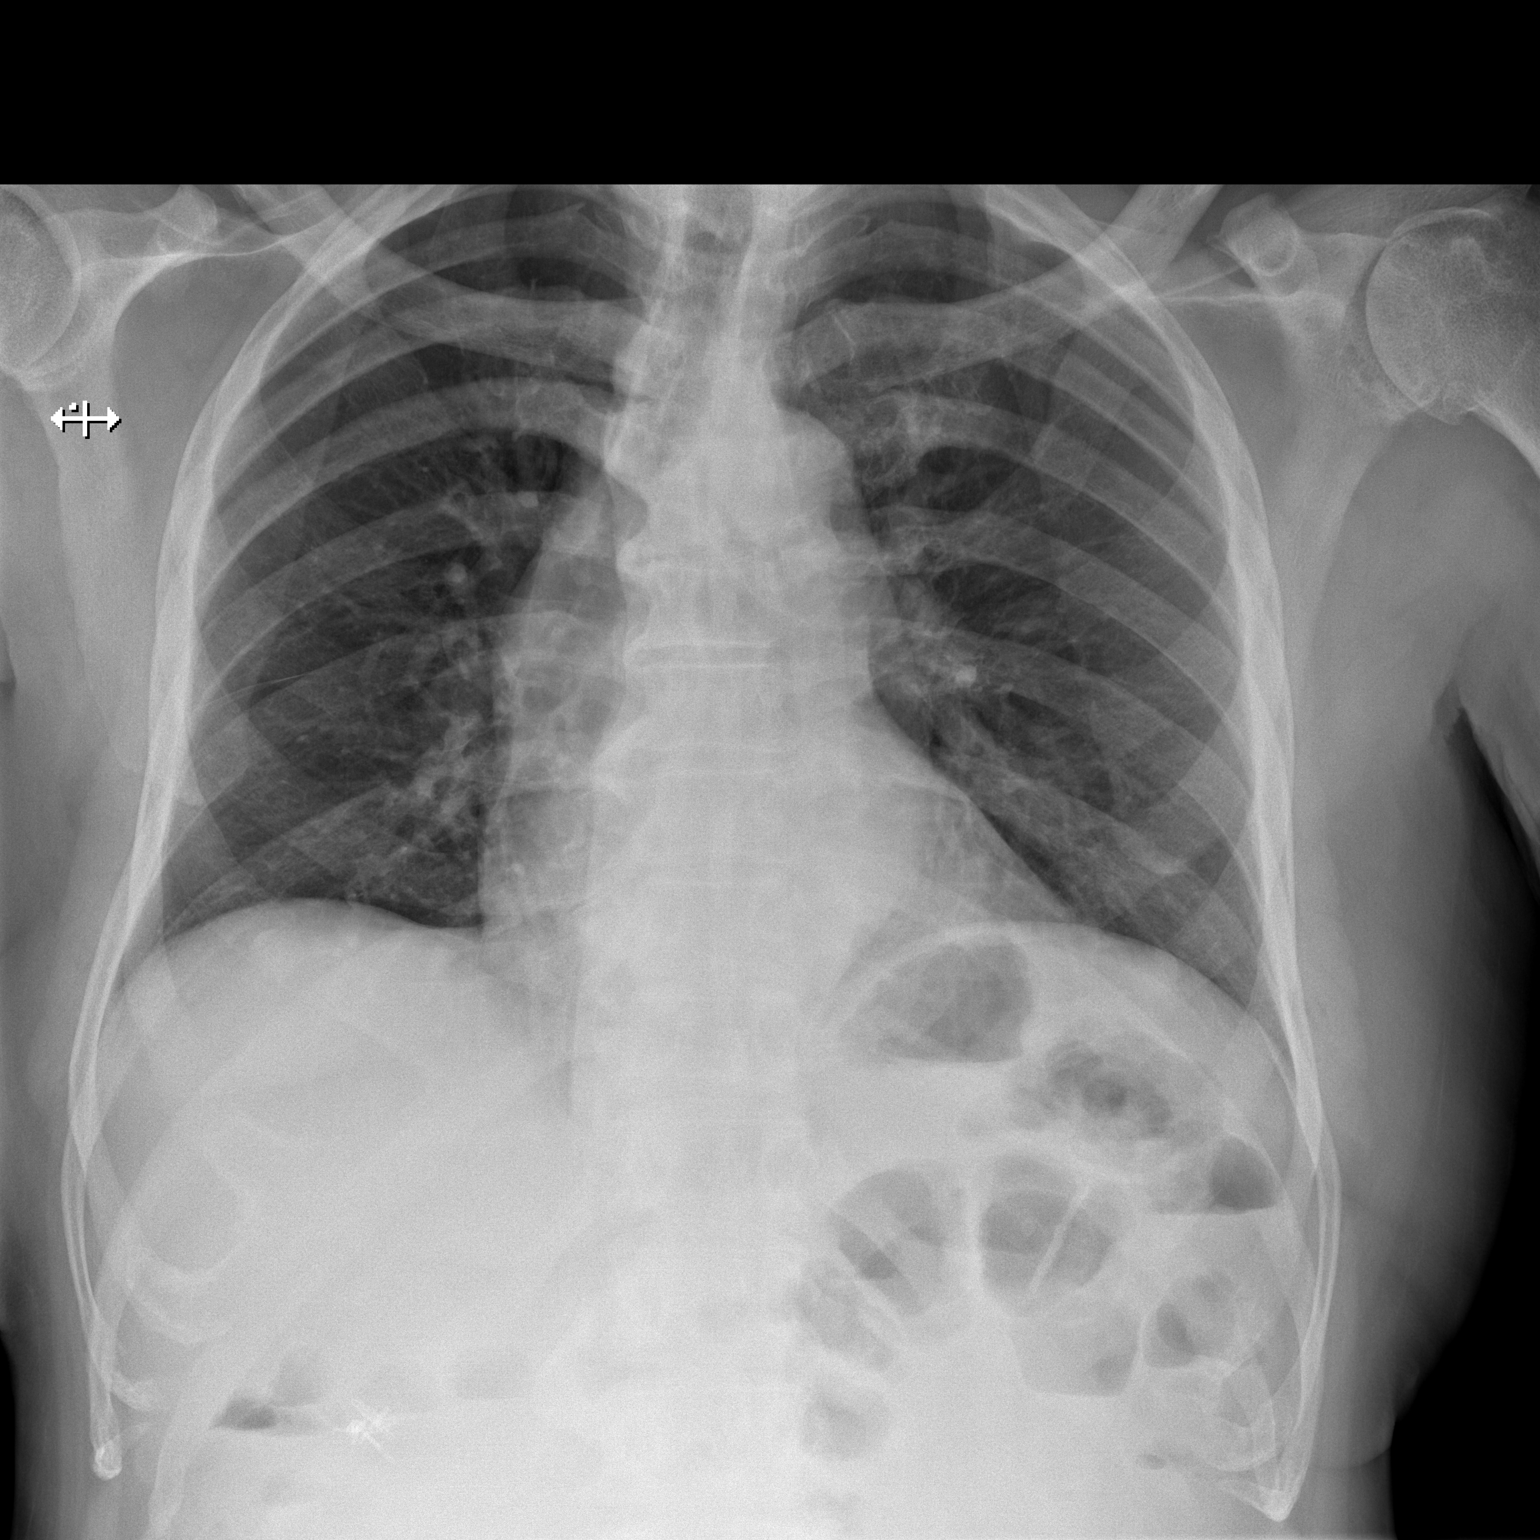

[w chest lat]
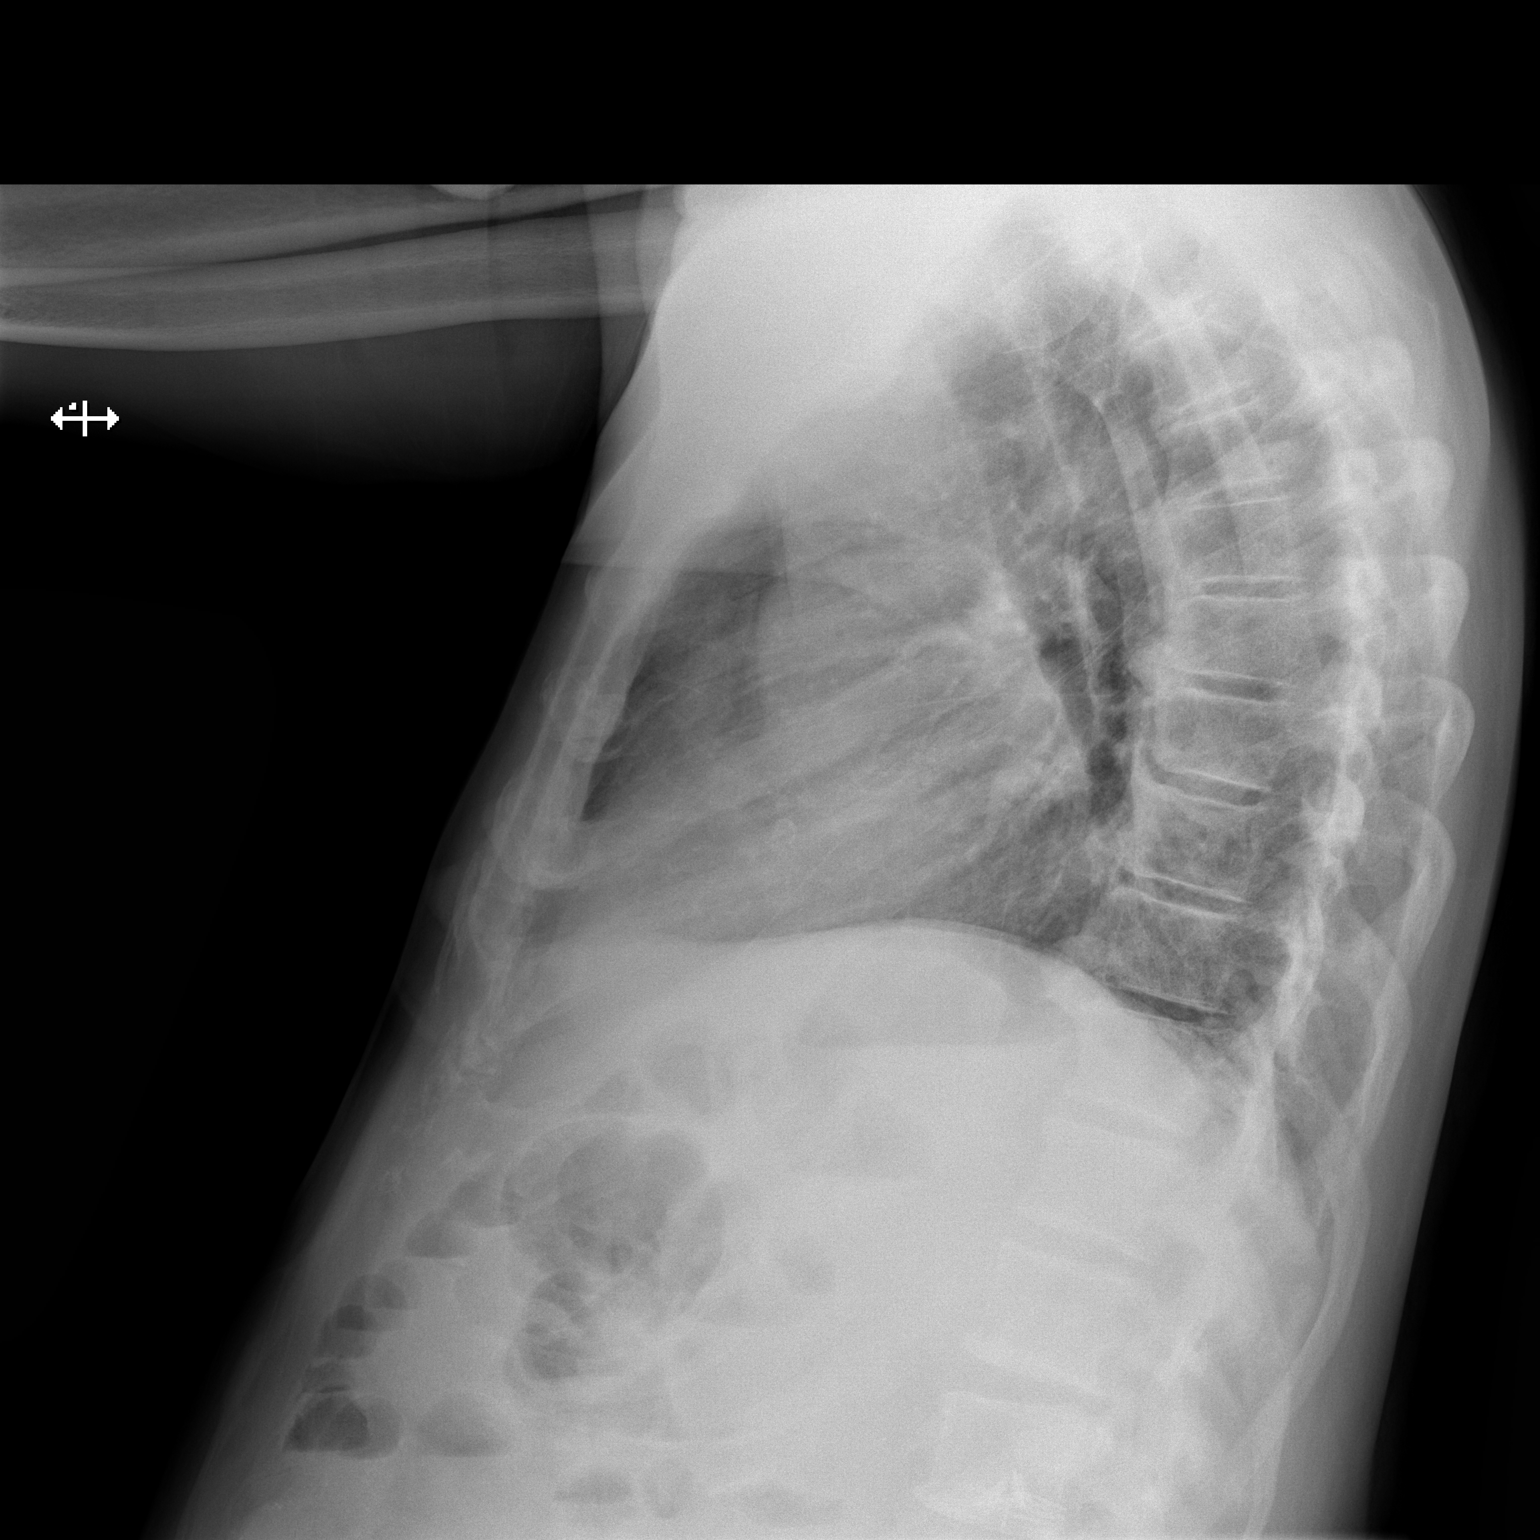

[2 of 2 positions shown; findings below may reference images not displayed]

FINDINGS: The heart and mediastinal contours are within normal limits.

No focal consolidation. No pulmonary edema. No pleural effusion. No
pneumothorax.

No acute osseous abnormality.

Right upper quadrant surgical clips.
IMPRESSION: No active cardiopulmonary disease.

## 2022-04-24 ENCOUNTER — Encounter (HOSPITAL_COMMUNITY)
Admission: RE | Admit: 2022-04-24 | Discharge: 2022-04-24 | Disposition: A | Discharge status: Home / Self Care | Payer: Medicare Other | Source: Ambulatory Visit | Attending: Nephrology | Admitting: Nephrology

## 2022-04-24 VITALS — BP 154/87 | HR 71 | Temp 97.5°F | Resp 18

## 2022-04-24 DIAGNOSIS — N185 Chronic kidney disease, stage 5: Secondary | ICD-10-CM | POA: Insufficient documentation

## 2022-04-24 LAB — POCT HEMOGLOBIN-HEMACUE: Hemoglobin: 7.8 g/dL — ABNORMAL LOW (ref 13.0–17.0)

## 2022-04-24 MED ORDER — EPOETIN ALFA-EPBX 40000 UNIT/ML IJ SOLN
INTRAMUSCULAR | Status: AC
  Filled 2022-04-24: qty 1

## 2022-04-24 MED ORDER — EPOETIN ALFA-EPBX 40000 UNIT/ML IJ SOLN
30000.0000 [IU] | INTRAMUSCULAR | Status: DC
  Administered 2022-04-24: 30000 [IU] via SUBCUTANEOUS

## 2022-04-24 NOTE — Progress Notes (Signed)
Hemocue today 7.8, recent hospitalization for CVA, called and reported result to [facility], awaiting return call

## 2022-04-24 NOTE — Progress Notes (Signed)
[facility] called again re:  HGB 25f7.8.  Per Dr PPosey Pronto no changes to plan and proceed with Retacrit 30000 units as ordered.

## 2022-04-25 ENCOUNTER — Ambulatory Visit (INDEPENDENT_AMBULATORY_CARE_PROVIDER_SITE_OTHER): Payer: Medicare Other | Admitting: Internal Medicine

## 2022-04-25 ENCOUNTER — Encounter: Payer: Self-pay | Admitting: Internal Medicine

## 2022-04-25 VITALS — BP 148/80 | HR 74 | Temp 98.4°F | Ht 75.0 in | Wt 206.0 lb

## 2022-04-25 DIAGNOSIS — N185 Chronic kidney disease, stage 5: Secondary | ICD-10-CM | POA: Diagnosis not present

## 2022-04-25 DIAGNOSIS — Z8673 Personal history of transient ischemic attack (TIA), and cerebral infarction without residual deficits: Secondary | ICD-10-CM | POA: Diagnosis not present

## 2022-04-25 DIAGNOSIS — D631 Anemia in chronic kidney disease: Secondary | ICD-10-CM | POA: Diagnosis not present

## 2022-04-25 DIAGNOSIS — Z823 Family history of stroke: Secondary | ICD-10-CM

## 2022-04-25 NOTE — Patient Instructions (Signed)
We will get you in with therapy and speech

## 2022-04-25 NOTE — Progress Notes (Signed)
   Subjective:   Patient ID: Antonio Adams, male    DOB: [birth year], 76 y.o.   MRN: [id number]  HPI The patient is a 76 YO man coming in for hospital follow up.   Review of Systems  Constitutional:  Positive for appetite change and fatigue.  HENT: Negative.    Eyes: Negative.   Respiratory:  Negative for cough, chest tightness and shortness of breath.   Cardiovascular:  Negative for chest pain, palpitations and leg swelling.  Gastrointestinal:  Negative for abdominal distention, abdominal pain, constipation, diarrhea, nausea and vomiting.  Musculoskeletal:  Positive for gait problem.  Skin: Negative.   Neurological:  Positive for weakness.  Psychiatric/Behavioral: Negative.      Objective:  Physical Exam Constitutional:      Appearance: He is well-developed. He is ill-appearing.  HENT:     Head: Normocephalic and atraumatic.  Cardiovascular:     Rate and Rhythm: Normal rate and regular rhythm.  Pulmonary:     Effort: Pulmonary effort is normal. No respiratory distress.     Breath sounds: Normal breath sounds. No wheezing or rales.  Abdominal:     General: Bowel sounds are normal. There is no distension.     Palpations: Abdomen is soft.     Tenderness: There is no abdominal tenderness. There is no rebound.  Musculoskeletal:     Cervical back: Normal range of motion.  Skin:    General: Skin is warm and dry.  Neurological:     Mental Status: He is alert and oriented to person, place, and time.     Coordination: Coordination abnormal.     Vitals:   04/25/22 1012 04/25/22 1019 04/25/22 1039  BP: (!) 160/80 (!) 160/80 (!) 148/80  Pulse: 74    Temp: 98.4 F (36.9 C)    TempSrc: Oral    SpO2: 99%    Weight: 206 lb (93.4 kg)    Height: '6\' 3"'$  (1.905 m)      Assessment & Plan:  Visit time 25 minutes in face to face communication with patient and coordination of care, additional 7 minutes spent in record review, coordination or care, ordering tests,  communicating/referring to other healthcare professionals, documenting in medical records all on the same day of the visit for total time 32 minutes spent on the visit.

## 2022-04-27 ENCOUNTER — Other Ambulatory Visit: Payer: Self-pay

## 2022-04-27 ENCOUNTER — Encounter: Payer: Self-pay | Admitting: Rehabilitation

## 2022-04-27 ENCOUNTER — Ambulatory Visit: Payer: Medicare Other | Attending: Internal Medicine | Admitting: Rehabilitation

## 2022-04-27 DIAGNOSIS — R278 Other lack of coordination: Secondary | ICD-10-CM | POA: Diagnosis not present

## 2022-04-27 DIAGNOSIS — R41842 Visuospatial deficit: Secondary | ICD-10-CM | POA: Diagnosis present

## 2022-04-27 DIAGNOSIS — R41841 Cognitive communication deficit: Secondary | ICD-10-CM | POA: Diagnosis present

## 2022-04-27 DIAGNOSIS — M6281 Muscle weakness (generalized): Secondary | ICD-10-CM | POA: Insufficient documentation

## 2022-04-27 DIAGNOSIS — R41840 Attention and concentration deficit: Secondary | ICD-10-CM | POA: Diagnosis present

## 2022-04-27 DIAGNOSIS — R2681 Unsteadiness on feet: Secondary | ICD-10-CM | POA: Insufficient documentation

## 2022-04-27 DIAGNOSIS — R41844 Frontal lobe and executive function deficit: Secondary | ICD-10-CM | POA: Diagnosis present

## 2022-04-27 DIAGNOSIS — R2689 Other abnormalities of gait and mobility: Secondary | ICD-10-CM | POA: Insufficient documentation

## 2022-04-27 NOTE — Therapy (Signed)
OUTPATIENT PHYSICAL THERAPY NEURO EVALUATION   Patient Name: Antonio Adams MRN: [id number] DOB:1946/04/07, 76 y.o., male Today's Date: 04/27/2022   PCP: Scarlette Calico, MD REFERRING PROVIDER: Pricilla Holm, MD  END OF SESSION:  PT End of Session - 04/27/22 0850     Visit Number 1    Number of Visits 17    Date for PT Re-Evaluation 06/26/22    Authorization Type [employer] Medicare    PT Start Time [phone]    PT Stop Time 0930    PT Time Calculation (min) 44 min    Activity Tolerance Patient tolerated treatment well    Behavior During Therapy WFL for tasks assessed/performed             Past Medical History:  Diagnosis Date   ADENOCARCINOMA, PROSTATE, GLEASON GRADE 6 12/21/2009   prostate cancer   ALLERGIC RHINITIS 03/16/2007   Allergy    Anxiety    Arthritis    back    BACK PAIN WITH RADICULOPATHY 11/23/2008   Cancer of kidney ([place])    partial right kidney removed   CARPAL TUNNEL SYNDROME, BILATERAL 03/16/2007   Chronic back pain    Constipation    takes Senokot daily   Depression    occasionally   DIABETES MELLITUS, TYPE II 03/16/2007   takes Januvia and MEtformin daily   GASTROINTESTINAL HEMORRHAGE, HX OF 03/16/2007   GERD 03/16/2007   takes Omeprazole daily   Glaucoma    mild - no eye drops   Hemorrhoids    History of colon polyps    HYPERLIPIDEMIA 03/16/2007   takes Crestor daily   HYPERTENSION 03/16/2007   takes Diltiazem and Lisinopril daily    Kidney cancer, primary, with metastasis from kidney to other site [facility])    LEG CRAMPS 05/22/2007   Nocturia    Overactive bladder    PARESTHESIA 05/21/2007   Proctitis    PTSD (post-traumatic stress disorder)    wakes up may be dreaming of fighting   Rectal bleeding    Dr.Norins has explained its from the Radiation that he has received   Rectal bleeding    Renal cell carcinoma ([place])    Sleep apnea    uses CPAP nightly   Stomach cancer ([place])    Urinary frequency    takes Toviaz daily   Past Surgical  History:  Procedure Laterality Date   ADRENALECTOMY Right 02/26/2020   Procedure: OPEN RIGHT RENAL CELL METASTATECTOMY AND  ADRENALECTOMY;  Surgeon: [name], MD;  Location: [facility];  Service: Urology;  Laterality: Right;   [place], 2008   repeat surgery; ESI '08   [place]     bilateral   CERVICAL FUSION     COLONOSCOPY     colonosocpy     ESOPHAGOGASTRODUODENOSCOPY     ED   FLEXIBLE SIGMOIDOSCOPY N/A 01/08/2017   Procedure: FLEXIBLE SIGMOIDOSCOPY;  Surgeon: Irene Shipper, MD;  Location: [place];  Service: Endoscopy;  Laterality: N/A;   HOT HEMOSTASIS N/A 01/08/2017   Procedure: HOT HEMOSTASIS (ARGON PLASMA COAGULATION/BICAP);  Surgeon: Irene Shipper, MD;  Location: [facility];  Service: Endoscopy;  Laterality: N/A;   IR RADIOLOGIST EVAL & MGMT  12/17/2019   IR RADIOLOGIST EVAL & MGMT  07/01/2020   IR RADIOLOGIST EVAL & MGMT  08/25/2020   LUMBAR LAMINECTOMY  10/30/2011   Procedure: MICRODISCECTOMY LUMBAR LAMINECTOMY;  Surgeon: Jessy Oto, MD;  Location: [place];  Service: Orthopedics;  Laterality: Right;  Right L4-5  and L5-S1 Microdiscectomy   LUMBAR LAMINECTOMY  06/17/2012   Procedure: MICRODISCECTOMY LUMBAR LAMINECTOMY;  Surgeon: Jessy Oto, MD;  Location: [place];  Service: Orthopedics;  Laterality: N/A;  Right L5-S1 microdiscectomy   OPEN PARTIAL HEPATECTOMY  N/A 02/26/2020   Procedure: OPEN PARTIAL HEPATECTOMY, CHOLECYSTECTOMY;  Surgeon: Stark Klein, MD;  Location[facility];  Service: General;  Laterality: N/A;   POLYPECTOMY     PROSTATE SURGERY  march 2012   seed implant   RADIOFREQUENCY ABLATION N/A 07/21/2020   Procedure: CT MICROWAVE ABLATION;  Surgeon: Criselda Peaches, MD;  Location[facility];  Service: Anesthesiology;  Laterality: N/A;   ROBOTIC ASSITED PARTIAL NEPHRECTOMY Right 09/08/2015   Procedure: XI ROBOTIC ASSITED RIGHT PARTIAL NEPHRECTOMY;  Surgeon: [name], MD;  [facility];  Service: Urology;  Laterality: Right;   Clamp on: 1033 Clamp off: 1103 Total Clamp Time: 30 minutes   SHOULDER ARTHROSCOPY WITH SUBACROMIAL DECOMPRESSION Left 01/20/2016   Procedure: LEFT SHOULDER ARTHROSCOPY WITH EXTENSIVE  DEBRIDEMENT, ACROMIOPLASTY;  Surgeon: Ninetta Lights, MD;  Location: [place];  Service: Orthopedics;  Laterality: Left;   SHOULDER SURGERY  LFT   Stress Cardiolite  12/24/2003   negative for ischemia   UPPER ESOPHAGEAL ENDOSCOPIC ULTRASOUND (EUS)  04/18/2016   [facility]   UPPER GASTROINTESTINAL ENDOSCOPY     WOUND EXPLORATION     management of a wound that he sustained in the [place]   Patient Active Problem List   Diagnosis Date Noted   Abnormal brain CT 04/07/2022   Encounter for general adult medical examination with abnormal findings 01/23/2022   Anemia due to zinc deficiency 12/20/2021   Low back pain 12/15/2021   Symptomatic anemia 12/15/2021   Cancer related pain 12/15/2021   Chronic heart failure with preserved ejection fraction (HFpEF) ([place]) 08/12/2021   Tinea cruris 08/12/2021   Anemia due to chronic kidney disease 07/20/2021   CKD (chronic kidney disease) stage 5, GFR less than 15 ml/min (HCC) 05/24/2021   BPH (benign prostatic hyperplasia) 05/24/2021   Diabetic renal disease ([place]) 05/20/2021   Dry eye syndrome of bilateral lacrimal glands 05/20/2021   Senile nuclear sclerosis 05/20/2021   Vitamin D deficiency 05/20/2021   Vitreous degeneration, unspecified eye 05/20/2021   Intrinsic eczema 04/18/2021   Atherosclerosis of aorta ([place]) 12/30/2020   Mixed conductive and sensorineural hearing loss of left ear with restricted hearing of right ear 09/01/2020   Tinnitus of both ears 05/20/2020   Metastatic renal cell carcinoma ([place]) 02/26/2020   Diabetic polyneuropathy associated with type 2 diabetes mellitus ([place]) 12/18/2019   Murmur, cardiac 10/08/2018   DDD (degenerative disc disease), cervical 10/08/2018   OAB (overactive bladder) 04/04/2018   Seasonal allergic  rhinitis due to pollen 11/01/2016   Microalbuminuria due to type 2 diabetes mellitus ([place]) 07/27/2016   Varicose veins of both lower extremities 08/06/2015   Varicose veins of lower extremities with ulcer ([place]) 06/16/2015   Internal hemorrhoid, bleeding 08/03/2014   Post traumatic stress disorder (PTSD) 01/29/2014   Spinal stenosis of lumbar region with radiculopathy 10/30/2011    Class: Chronic   Obstructive sleep apnea 11/24/2010   Obesity 11/24/2010   Routine general medical examination at a health care facility 11/24/2010   ADENOCARCINOMA, PROSTATE, GLEASON GRADE 6 12/21/2009   Type 2 diabetes mellitus with diabetic cataract ([place]) 03/16/2007   Essential hypertension 03/16/2007   GERD 03/16/2007    ONSET DATE: 04/07/22  REFERRING DIAG: C58.52 (ICD-10-CM) - Hx of completed stroke  THERAPY  DIAG:  Unsteadiness on feet  Other abnormalities of gait and mobility  Muscle weakness (generalized)  Rationale for Evaluation and Treatment: Rehabilitation  SUBJECTIVE:                                                                                                                                                                                             SUBJECTIVE STATEMENT: Pt reports "I'm doing fine."  "Im the type of person that doesn't stay sick long. " Wife reports some memory issues.  Pt accompanied by: significant other [place])  PERTINENT HISTORY: STANCIL DEISHER is a 76 y.o. R-handed male with history of DM2 with diabetic polyneuropathy, OSA on nightly CPAP, PTSD, history of lumbar stenosis with chronic back pain, metastatic renal carcinoma with lymphadenopathy and questionable hepatic involvement noted in 2022, not on chemo due to poor tolerance to the medications, and without significant progression since May 2023, only on surveillance imaging, stage IV, eczema, vitamin D deficiency, CKD stage V refusing HD, BPH, anemia of CKD  Zinc deficiency, chronic heart failure, mild  cognitive impairment, presenting for evaluation of left PCA territory acute CVA.  PAIN:  Are you having pain? No  PRECAUTIONS: Fall  WEIGHT BEARING RESTRICTIONS: No  FALLS: Has patient fallen in last 6 months? Yes. Number of falls 1  LIVING ENVIRONMENT: Lives with: lives with their spouse Lives in: House/apartment Stairs: Yes: External: 10 steps; can reach both Has following equipment at home: Single point cane  PLOF: Independent with household mobility with device, Independent with community mobility with device, and Needs assistance with ADLs  PATIENT GOALS: "To be normal again"   OBJECTIVE:   DIAGNOSTIC FINDINGS: MRI of the brain showing a left PCA large infarct and punctate right PCA infarct MRA remarkable for left PCA P3 branch occlusion, ICA siphon atherosclerosis with possible tandem significant stenosis of the right siphon  COGNITION: Overall cognitive status: Impaired Fiance reports short term memory deficits.  Fiance reports that he sometimes forgets to take his medication.    SENSATION: WFL  COORDINATION: Heel/Shin grossly WFL    MUSCLE LENGTH: Good hamstring length.   POSTURE: rounded shoulders, forward head, and posterior pelvic tilt  LOWER EXTREMITY ROM:      Right Eval Left Eval  Hip flexion    Hip extension    Hip abduction    Hip adduction    Hip internal rotation    Hip external rotation    Knee flexion    Knee extension    Ankle dorsiflexion    Ankle plantarflexion    Ankle inversion    Ankle eversion     (Blank rows =  not tested) Grossly WFL   LOWER EXTREMITY MMT:    MMT Right Eval Left Eval  Hip flexion 3+/5 4/5  Hip extension    Hip abduction 5/5 5/5  Hip adduction 5/5 5/5  Hip internal rotation    Hip external rotation    Knee flexion 5/5 5/5  Knee extension 3+/5 4/5  Ankle dorsiflexion 3+/5 4/5  Ankle plantarflexion 4/5 4/5  Ankle inversion    Ankle eversion    (Blank rows = not tested) All tested in seated position    BED MOBILITY:  Reports no difficulty   TRANSFERS: Assistive device utilized: Single point cane  Sit to stand: SBA, CGA, and Min A Stand to sit: SBA Chair to chair: SBA Floor:  n/a    STAIRS: Level of Assistance: SBA Stair Negotiation Technique: Alternating Pattern  Forwards With use of AD: cane  with Single Rail on Left Number of Stairs: 4  Height of Stairs: 6  Comments:   GAIT: Gait pattern: step through pattern, decreased stride length, decreased ankle dorsiflexion- Right, decreased ankle dorsiflexion- Left, trendelenburg, and trunk flexed Distance walked: 200' in gym and around track during FGA Assistive device utilized: Single point cane and None Level of assistance: SBA and CGA Comments: Had him ambulate with and without cane during session.  He is able to ambulate at S level with cane and note less hip weakness however when he ambulates without cane, he has a pretty significant Trendelenburg gait more pronounced on R side.    FUNCTIONAL TESTS:  5 times sit to stand: 36.94 secs without UE support iniitally, but then needing some assist pushing on legs mild LOB forward x 2 reps 10 meter walk test: 2.00 ft/sec with cane  Functional gait assessment: 12/30   [facility] PT Assessment - 04/27/22 0921       Functional Gait  Assessment   Gait assessed  Yes    Gait Level Surface Walks 20 ft, slow speed, abnormal gait pattern, evidence for imbalance or deviates 10-15 in outside of the 12 in walkway width. Requires more than 7 sec to ambulate 20 ft.    Change in Gait Speed Able to change speed, demonstrates mild gait deviations, deviates 6-10 in outside of the 12 in walkway width, or no gait deviations, unable to achieve a major change in velocity, or uses a change in velocity, or uses an assistive device.    Gait with Horizontal Head Turns Performs head turns with moderate changes in gait velocity, slows down, deviates 10-15 in outside 12 in walkway width but recovers, can continue  to walk.    Gait with Vertical Head Turns Performs task with moderate change in gait velocity, slows down, deviates 10-15 in outside 12 in walkway width but recovers, can continue to walk.    Gait and Pivot Turn Turns slowly, requires verbal cueing, or requires several small steps to catch balance following turn and stop    Step Over Obstacle Is able to step over one shoe box (4.5 in total height) but must slow down and adjust steps to clear box safely. May require verbal cueing.    Gait with Narrow Base of Support Ambulates less than 4 steps heel to toe or cannot perform without assistance.    Gait with Eyes Closed Walks 20 ft, slow speed, abnormal gait pattern, evidence for imbalance, deviates 10-15 in outside 12 in walkway width. Requires more than 9 sec to ambulate 20 ft.    Ambulating Backwards Walks 20 ft, uses assistive device,  slower speed, mild gait deviations, deviates 6-10 in outside 12 in walkway width.    Steps Alternating feet, must use rail.    Total Score 12    FGA comment: < 19 = high risk fall                TODAY'S TREATMENT:                                                                                                                              DATE: none initiated today     PATIENT EDUCATION: Education details: Educated on evaluation results, POC, goals Person educated: Patient and Spouse Education method: Explanation, Demonstration, and Verbal cues Education comprehension: verbalized understanding and needs further education  HOME EXERCISE PROGRAM: None initiated today   GOALS: Goals reviewed with patient? Yes  SHORT TERM GOALS: Target date: 05/26/22  Pt will be IND with initial HEP in order to indicate improved functional mobility and dec fall risk. Baseline: Goal status: INITIAL  2.  Pt will improve gait speed to >/=2.62 ft/sec w/ SPC in order to indicate dec fall risk.  Baseline: 2.00 ft/sec  Goal status: INITIAL  3.  Pt will ambulate 17'  without at mod I level with only minimal Trendelenburg in order to indicate more independent household ambulation.   Baseline:  Goal status: INITIAL  4.  Pt will improve FGA to >/=18/30 in order to indicate dec fall risk.   Baseline: 12/30 Goal status: INITIAL  5.  Pt will improve 5TSS to </=30 secs without UE support only in order to indicate dec fall risk and improved functional strength.   Baseline: approx 37 secs with hands on lap and mild anterior LOB Goal status: INITIAL  6.  Pt will negotiate up/down 4 steps with single rail/cane, up/down ramp and curb and ambulate x 500' outdoors over unlevel paved surfaces at S level in order to indicate improved community mobility.   Baseline:  Goal status: INITIAL  LONG TERM GOALS: Target date: 06/26/22  Pt will be IND with final HEP in order to indicate improved functional mobility and dec fall risk. Baseline:  Goal status: INITIAL  2.  Pt will improve FGA to >/=23/30 in order to indicate dec fall risk.   Baseline:  Goal status: INITIAL  3.  Pt will improve 5TSS to </=20 secs without UE support only in order to indicate dec fall risk and improved functional strength.   Baseline:  Goal status: INITIAL  4.  Pt will improve gait speed to >/=2.62 ft/sec w/o AD in order to indicate dec fall risk.  Baseline:  Goal status: INITIAL  5.  Pt will ambulate 230' without AD at mod I level in order to indicate more independent household ambulation.   Baseline:  Goal status: INITIAL  6.  Pt will negotiate up/down 4 steps with single rail, up/down ramp and curb and ambulate x 500' outdoors over unlevel paved surfaces without device at [employer]  level in order to indicate improved community mobility.   Baseline:  Goal status: INITIAL  ASSESSMENT:  CLINICAL IMPRESSION: Patient is a 76 y.o. male who was seen today for physical therapy evaluation and treatment for deficits following L PCA CVA on 04/07/22.   Note history of DM2 with diabetic  polyneuropathy, OSA on nightly CPAP, PTSD, history of lumbar stenosis with chronic back pain, metastatic renal carcinoma with lymphadenopathy and questionable hepatic involvement noted in 2022, not on chemo due to poor tolerance to the medications, and without significant progression since May 2023, only on surveillance imaging, stage IV, eczema, vitamin D deficiency, CKD stage V refusing HD, BPH, anemia of CKD  Zinc deficiency, chronic heart failure, mild cognitive impairment, presenting for evaluation of left PCA territory acute CVA.  Upon PT evaluation, note decreased gait speed at 2.00 ft/sec indicative of limited community ambulation, FGA score of 12/30 indicative of high fall risk and 5TSS time of 37 secs, indicative of decreased functional strength and high fall risk.    OBJECTIVE IMPAIRMENTS: Abnormal gait, decreased activity tolerance, decreased balance, decreased endurance, decreased mobility, decreased strength, impaired perceived functional ability, and postural dysfunction.   ACTIVITY LIMITATIONS: carrying, lifting, bending, standing, squatting, stairs, transfers, and locomotion level  PARTICIPATION LIMITATIONS: meal prep, cleaning, medication management, personal finances, driving, and community activity  PERSONAL FACTORS: 3+ comorbidities: see above  are also affecting patient's functional outcome.   REHAB POTENTIAL: Good  CLINICAL DECISION MAKING: Evolving/moderate complexity  EVALUATION COMPLEXITY: Moderate  PLAN:  PT FREQUENCY: 2x/week  PT DURATION: 8 weeks  PLANNED INTERVENTIONS: Therapeutic exercises, Therapeutic activity, Neuromuscular re-education, Balance training, Gait training, Patient/Family education, Self Care, Stair training, Vestibular training, DME instructions, and Aquatic Therapy  PLAN FOR NEXT SESSION: Est HEP for LE strengthening (hip flexors, hip abd, glutes, sit<>stand) and also balance (counter top/corner), gait with and without cane, stair  negotiation.   Cameron Sprang, PT, MPT [facility] [address] [place], [place], [address] Phone: [phone]   Fax:  [phone] 04/27/22, 10:44 AM

## 2022-04-28 ENCOUNTER — Encounter: Payer: Self-pay | Admitting: Internal Medicine

## 2022-04-28 DIAGNOSIS — Z8673 Personal history of transient ischemic attack (TIA), and cerebral infarction without residual deficits: Secondary | ICD-10-CM | POA: Insufficient documentation

## 2022-04-28 NOTE — Assessment & Plan Note (Signed)
Stroke in hospital recently and he did not need SNF but does need referral for SLP and PT and OT which is done today. He is able to walk but not steady.

## 2022-04-28 NOTE — Assessment & Plan Note (Signed)
Recent labs stable. No clinical signs of bleeding.

## 2022-04-28 NOTE — Assessment & Plan Note (Signed)
Overall stable since leaving hospital and no signs of progression so labs not indicated today. Seeing nephrology monthly for labs currently.

## 2022-05-03 ENCOUNTER — Encounter: Payer: Self-pay | Admitting: Speech Pathology

## 2022-05-03 ENCOUNTER — Encounter: Payer: Self-pay | Admitting: Occupational Therapy

## 2022-05-03 ENCOUNTER — Ambulatory Visit: Payer: Medicare Other | Admitting: Occupational Therapy

## 2022-05-03 ENCOUNTER — Ambulatory Visit: Payer: Medicare Other | Admitting: Speech Pathology

## 2022-05-03 DIAGNOSIS — M6281 Muscle weakness (generalized): Secondary | ICD-10-CM | POA: Diagnosis not present

## 2022-05-03 DIAGNOSIS — R278 Other lack of coordination: Secondary | ICD-10-CM

## 2022-05-03 DIAGNOSIS — R2681 Unsteadiness on feet: Secondary | ICD-10-CM

## 2022-05-03 DIAGNOSIS — R41840 Attention and concentration deficit: Secondary | ICD-10-CM

## 2022-05-03 DIAGNOSIS — R2689 Other abnormalities of gait and mobility: Secondary | ICD-10-CM | POA: Diagnosis not present

## 2022-05-03 DIAGNOSIS — R41842 Visuospatial deficit: Secondary | ICD-10-CM

## 2022-05-03 DIAGNOSIS — R41844 Frontal lobe and executive function deficit: Secondary | ICD-10-CM

## 2022-05-03 DIAGNOSIS — R41841 Cognitive communication deficit: Secondary | ICD-10-CM

## 2022-05-03 NOTE — Therapy (Addendum)
OUTPATIENT SPEECH LANGUAGE PATHOLOGY EVALUATION   Patient Name: Antonio Adams MRN: [id number] DOB:05-11-1946, 76 y.o., male Today's Date: 05/03/2022  PCP: [name]., MD REFERRING PROVIDER: Hoyt Koch., MD  END OF SESSION:  End of Session - 05/03/22 1129     Visit Number 1    Number of Visits 25    Date for SLP Re-Evaluation 07/26/22    Authorization Type [employer] Medicare    SLP Start Time 7741    SLP Stop Time  1015    SLP Time Calculation (min) 44 min    Activity Tolerance Patient tolerated treatment well             Past Medical History:  Diagnosis Date   ADENOCARCINOMA, PROSTATE, GLEASON GRADE 6 12/21/2009   prostate cancer   ALLERGIC RHINITIS 03/16/2007   Allergy    Anxiety    Arthritis    back    BACK PAIN WITH RADICULOPATHY 11/23/2008   Cancer of kidney ([place])    partial right kidney removed   CARPAL TUNNEL SYNDROME, BILATERAL 03/16/2007   Chronic back pain    Constipation    takes Senokot daily   Depression    occasionally   DIABETES MELLITUS, TYPE II 03/16/2007   takes Januvia and MEtformin daily   GASTROINTESTINAL HEMORRHAGE, HX OF 03/16/2007   GERD 03/16/2007   takes Omeprazole daily   Glaucoma    mild - no eye drops   Hemorrhoids    History of colon polyps    HYPERLIPIDEMIA 03/16/2007   takes Crestor daily   HYPERTENSION 03/16/2007   takes Diltiazem and Lisinopril daily    Kidney cancer, primary, with metastasis from kidney to other site [facility])    LEG CRAMPS 05/22/2007   Nocturia    Overactive bladder    PARESTHESIA 05/21/2007   Proctitis    PTSD (post-traumatic stress disorder)    wakes up may be dreaming of fighting   Rectal bleeding    Dr.Norins has explained its from the Radiation that he has received   Rectal bleeding    Renal cell carcinoma ([place])    Sleep apnea    uses CPAP nightly   Stomach cancer ([place])    Urinary frequency    takes Toviaz daily   Past Surgical History:  Procedure Laterality Date   ADRENALECTOMY  Right 02/26/2020   Procedure: OPEN RIGHT RENAL CELL METASTATECTOMY AND  ADRENALECTOMY;  Surgeon: [name], MD;  Location: [facility];  Service: Urology;  Laterality: Right;   [place], 2008   repeat surgery; ESI '08   [place]     bilateral   CERVICAL FUSION     COLONOSCOPY     colonosocpy     ESOPHAGOGASTRODUODENOSCOPY     ED   FLEXIBLE SIGMOIDOSCOPY N/A 01/08/2017   Procedure: FLEXIBLE SIGMOIDOSCOPY;  Surgeon: Irene Shipper, MD;  Location: [place];  Service: Endoscopy;  Laterality: N/A;   HOT HEMOSTASIS N/A 01/08/2017   Procedure: HOT HEMOSTASIS (ARGON PLASMA COAGULATION/BICAP);  Surgeon: Irene Shipper, MD;  Location: [facility] ENDOSCOPY;  Service: Endoscopy;  Laterality: N/A;   IR RADIOLOGIST EVAL & MGMT  12/17/2019   IR RADIOLOGIST EVAL & MGMT  07/01/2020   IR RADIOLOGIST EVAL & MGMT  08/25/2020   LUMBAR LAMINECTOMY  10/30/2011   Procedure: MICRODISCECTOMY LUMBAR LAMINECTOMY;  Surgeon: Jessy Oto, MD;  Location: [place];  Service: Orthopedics;  Laterality: Right;  Right L4-5 and L5-S1 Microdiscectomy   LUMBAR LAMINECTOMY  06/17/2012  Procedure: MICRODISCECTOMY LUMBAR LAMINECTOMY;  Surgeon: Jessy Oto, MD;  Location: [place];  Service: Orthopedics;  Laterality: N/A;  Right L5-S1 microdiscectomy   OPEN PARTIAL HEPATECTOMY  N/A 02/26/2020   Procedure: OPEN PARTIAL HEPATECTOMY, CHOLECYSTECTOMY;  Surgeon: Stark Klein, MD;  Location: [facility];  Service: General;  Laterality: N/A;   POLYPECTOMY     PROSTATE SURGERY  march 2012   seed implant   RADIOFREQUENCY ABLATION N/A 07/21/2020   Procedure: CT MICROWAVE ABLATION;  Surgeon: Criselda Peaches, MD;  Location: [facility];  Service: Anesthesiology;  Laterality: N/A;   ROBOTIC ASSITED PARTIAL NEPHRECTOMY Right 09/08/2015   Procedure: XI ROBOTIC ASSITED RIGHT PARTIAL NEPHRECTOMY;  Surgeon: [name], MD;  Location: [facility];  Service: Urology;  Laterality: Right;  Clamp on: 1033 Clamp off: 1103 Total Clamp Time: 30  minutes   SHOULDER ARTHROSCOPY WITH SUBACROMIAL DECOMPRESSION Left 01/20/2016   Procedure: LEFT SHOULDER ARTHROSCOPY WITH EXTENSIVE  DEBRIDEMENT, ACROMIOPLASTY;  Surgeon: Ninetta Lights, MD;  Location: [place];  Service: Orthopedics;  Laterality: Left;   SHOULDER SURGERY  LFT   Stress Cardiolite  12/24/2003   negative for ischemia   UPPER ESOPHAGEAL ENDOSCOPIC ULTRASOUND (EUS)  04/18/2016   [facility]   UPPER GASTROINTESTINAL ENDOSCOPY     WOUND EXPLORATION     management of a wound that he sustained in the [place]   Patient Active Problem List   Diagnosis Date Noted   Hx of completed stroke 04/28/2022   Abnormal brain CT 04/07/2022   Encounter for general adult medical examination with abnormal findings 01/23/2022   Anemia due to zinc deficiency 12/20/2021   Low back pain 12/15/2021   Symptomatic anemia 12/15/2021   Cancer related pain 12/15/2021   Chronic heart failure with preserved ejection fraction (HFpEF) ([place]) 08/12/2021   Tinea cruris 08/12/2021   Anemia due to chronic kidney disease 07/20/2021   CKD (chronic kidney disease) stage 5, GFR less than 15 ml/min (HCC) 05/24/2021   BPH (benign prostatic hyperplasia) 05/24/2021   Diabetic renal disease ([place]) 05/20/2021   Dry eye syndrome of bilateral lacrimal glands 05/20/2021   Senile nuclear sclerosis 05/20/2021   Vitamin D deficiency 05/20/2021   Vitreous degeneration, unspecified eye 05/20/2021   Intrinsic eczema 04/18/2021   Atherosclerosis of aorta ([place]) 12/30/2020   Mixed conductive and sensorineural hearing loss of left ear with restricted hearing of right ear 09/01/2020   Tinnitus of both ears 05/20/2020   Metastatic renal cell carcinoma ([place]) 02/26/2020   Diabetic polyneuropathy associated with type 2 diabetes mellitus ([place]) 12/18/2019   Murmur, cardiac 10/08/2018   DDD (degenerative disc disease), cervical 10/08/2018   OAB (overactive bladder) 04/04/2018   Seasonal allergic rhinitis due to  pollen 11/01/2016   Microalbuminuria due to type 2 diabetes mellitus ([place]) 07/27/2016   Varicose veins of both lower extremities 08/06/2015   Varicose veins of lower extremities with ulcer ([place]) 06/16/2015   Internal hemorrhoid, bleeding 08/03/2014   Post traumatic stress disorder (PTSD) 01/29/2014   Spinal stenosis of lumbar region with radiculopathy 10/30/2011    Class: Chronic   Obstructive sleep apnea 11/24/2010   Obesity 11/24/2010   Routine general medical examination at a health care facility 11/24/2010   ADENOCARCINOMA, PROSTATE, GLEASON GRADE 6 12/21/2009   Type 2 diabetes mellitus with diabetic cataract ([place]) 03/16/2007   Essential hypertension 03/16/2007   GERD 03/16/2007    ONSET DATE: 04-07-22   REFERRING DIAG: I94.85 (ICD-10-CM) - Personal history of transient ischemic attack (TIA), and cerebral  infarction without residual deficits   THERAPY DIAG:  No diagnosis found.  Rationale for Evaluation and Treatment: Rehabilitation  SUBJECTIVE:   SUBJECTIVE STATEMENT: "I'm fine I think"  Pt accompanied by:  significant other, [name]   PERTINENT HISTORY:  history of HTN, DM, CKD4, chronic anemia, LBP, renal cell carcinoma with mets but stable presented to ED for lethargy, fell 2 days ago and HA for one day. CT showed subacute left PCA infarct. Admitted 04-07-22, d/c 04-09-22.   PAIN:  Are you having pain? No  FALLS: Has patient fallen in last 6 months?  Yes, See PT evaluation for details  LIVING ENVIRONMENT: Lives with: lives alone Lives in: House/apartment  PLOF:  Level of assistance: Independent with IADLs Employment: Retired  PATIENT GOALS: increased independence   OBJECTIVE:   DIAGNOSTIC FINDINGS: 04-08-22 MRI IMPRESSION: 1. 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 Also punctate contralateral right PCA infarct. Possible mild petechial hemorrhage. But no malignant hemorrhagic transformation or significant  intracranial mass effect.   2. Intracranial MRA is negative for large vessel occlusion. But positive for evidence of Left PCA P3 branch occlusion, and ICA siphon atherosclerosis with possible tandem significant stenoses of the Right siphon.  COGNITION: Overall cognitive status: Impaired Areas of impairment:  Orientation: Impaired: unable to relay address, day/year, location. Is oriented to self, year, situation.  Attention: Impaired: Sustained, Selective, Alternating, Divided Memory: Impaired: Working Industrial/product designer term Prospective Auditory Awareness: Impaired: Intellectual, Emergent, Anticipatory, and Neglect Executive function: Impaired: Problem solving, Organization, Planning, Error awareness, Self-correction, and Slow processing Functional deficits: requires A from s/o for medication, schedule, and financial management. Decreased memory impacting ability to participate in desired tasks. Reduced awareness with potential safety impact.   COGNITIVE COMMUNICATION: Following directions: Follows multi-step commands inconsistently  Auditory comprehension: Impaired: appears 2/2 working memory deficits Verbal expression: WFL, however, notable for reduced utterance length and apparent reticence to answer SLP questions.  Functional communication: WFL  ORAL MOTOR EXAMINATION: Overall status: WFL  STANDARDIZED ASSESSMENTS: SLP initiated CLQT: Memory: Severe and Language: Moderate  PATIENT REPORTED OUTCOME MEASURES (PROM): Deferred d/t time constraints, to be completed subsequent ST session   TODAY'S TREATMENT:  05-03-22: Education provided on evaluation results and SLP's recommendations. Initiated training re: orientation aids. Pt verbalizes agreement with POC, all questions answered to satisfaction.     PATIENT EDUCATION: Education details: see above Person educated: Patient and Spouse Education method: Explanation, Demonstration, and Verbal cues Education comprehension: verbalized  understanding, returned demonstration, and needs further education   GOALS: Goals reviewed with patient? Yes  SHORT TERM GOALS: Target date: 05-31-22  Pt will complete CLQT and PROM initial therapy session Baseline: Goal status: INITIAL  2.  Pt will accurately sort medications with use of compensations PRN into pill box with occasional mod-A Baseline:  Goal status: INITIAL  3.  Pt will generate visual aid to assist with increased independence in paying bills with A from SLP and s/o Baseline:  Goal status: INITIAL  4.  Pt will verbalize appropriate memory strategy given functional scenario with 80% accuracy   Baseline:  Goal status: INITIAL  5.  Pt and s/o will implement external A for orientation (e.g. calendar) with usual cues from care partner to initiate use  Baseline:  Goal status: INITIAL   LONG TERM GOALS: Target date: 07-26-22  With use of external aid, pt will answer orientation questions with 100% accuracy over 2 sessions Baseline:  Goal status: INITIAL  2.  Pt will verbalize x3 situations in which  he successfully employed a memory strategy resulting in subjective satisfaction with ability to recall desired information Baseline:  Goal status: INITIAL  3.  Pt will pay x3 of own bills with supervision from s/o, with use of compensations PRN Baseline:  Goal status: INITIAL  4.  Pt will report improvement via PROM by dc Baseline:  Goal status: INITIAL  ASSESSMENT:  CLINICAL IMPRESSION: Patient is a 76 y.o. M who was seen today for cognitive linguistic evaluation s/p stroke. Mr. Canoy presents with moderate cognitive impairment in areas of orientation, awareness, memory, executive functioning. Language marked as severe per CLQT, however pt and caregiver insistent that communication not impacted by stroke. Overall reduced verbal output and resistant to speak during evaluation in response to SLP questions/comments. Pt requiring assistance from significant other  to manage medication, schedule, and finances. Prior to stroke pt was fully independent. Pt would benefit from skilled ST to facilitate increased independence, enhance QoL, and reduced caregiver burden.    OBJECTIVE IMPAIRMENTS: include attention, memory, awareness, executive functioning, and expressive language. These impairments are limiting patient from managing medications, managing appointments, managing finances, household responsibilities, and ADLs/IADLs. Factors affecting potential to achieve goals and functional outcome are ability to learn/carryover information and cooperation/participation level.. Patient will benefit from skilled SLP services to address above impairments and improve overall function.  REHAB POTENTIAL: Fair (carryover and participation)  PLAN:  SLP FREQUENCY: 2x/week  SLP DURATION: 12 weeks  PLANNED INTERVENTIONS: Cueing hierachy, Cognitive reorganization, Internal/external aids, Functional tasks, SLP instruction and feedback, Compensatory strategies, and Patient/family education    Su Monks, CCC-SLP 05/03/2022, 11:30 AM

## 2022-05-03 NOTE — Therapy (Signed)
OUTPATIENT OCCUPATIONAL THERAPY NEURO EVALUATION  Patient Name: Antonio Adams MRN: [id number] DOB:October 31, 1945, 76 y.o., male Today's Date: 05/03/2022  PCP: Dr. Sharlet Salina REFERRING PROVIDER: Dr. Sharlet Salina  END OF SESSION:  OT End of Session - 05/03/22 0854     Visit Number 1    Number of Visits 25    Date for OT Re-Evaluation 07/26/22    Authorization Type [employer] Medicare    OT Start Time 0850    OT Stop Time 0930    OT Time Calculation (min) 40 min    Behavior During Therapy WFL for tasks assessed/performed             Past Medical History:  Diagnosis Date   ADENOCARCINOMA, PROSTATE, GLEASON GRADE 6 12/21/2009   prostate cancer   ALLERGIC RHINITIS 03/16/2007   Allergy    Anxiety    Arthritis    back    BACK PAIN WITH RADICULOPATHY 11/23/2008   Cancer of kidney ([place])    partial right kidney removed   CARPAL TUNNEL SYNDROME, BILATERAL 03/16/2007   Chronic back pain    Constipation    takes Senokot daily   Depression    occasionally   DIABETES MELLITUS, TYPE II 03/16/2007   takes Januvia and MEtformin daily   GASTROINTESTINAL HEMORRHAGE, HX OF 03/16/2007   GERD 03/16/2007   takes Omeprazole daily   Glaucoma    mild - no eye drops   Hemorrhoids    History of colon polyps    HYPERLIPIDEMIA 03/16/2007   takes Crestor daily   HYPERTENSION 03/16/2007   takes Diltiazem and Lisinopril daily    Kidney cancer, primary, with metastasis from kidney to other site [facility])    LEG CRAMPS 05/22/2007   Nocturia    Overactive bladder    PARESTHESIA 05/21/2007   Proctitis    PTSD (post-traumatic stress disorder)    wakes up may be dreaming of fighting   Rectal bleeding    Dr.Norins has explained its from the Radiation that he has received   Rectal bleeding    Renal cell carcinoma ([place])    Sleep apnea    uses CPAP nightly   Stomach cancer ([place])    Urinary frequency    takes Toviaz daily   Past Surgical History:  Procedure Laterality Date   ADRENALECTOMY Right 02/26/2020    Procedure: OPEN RIGHT RENAL CELL METASTATECTOMY AND  ADRENALECTOMY;  Surgeon: [name], MD;  Location: [facility];  Service: Urology;  Laterality: Right;   [place], 2008   repeat surgery; ESI '08   Encinal     bilateral   CERVICAL FUSION     COLONOSCOPY     colonosocpy     ESOPHAGOGASTRODUODENOSCOPY     ED   FLEXIBLE SIGMOIDOSCOPY N/A 01/08/2017   Procedure: FLEXIBLE SIGMOIDOSCOPY;  Surgeon: Irene Shipper, MD;  Location: [place];  Service: Endoscopy;  Laterality: N/A;   HOT HEMOSTASIS N/A 01/08/2017   Procedure: HOT HEMOSTASIS (ARGON PLASMA COAGULATION/BICAP);  Surgeon: Irene Shipper, MD;  Location: [facility];  Service: Endoscopy;  Laterality: N/A;   IR RADIOLOGIST EVAL & MGMT  12/17/2019   IR RADIOLOGIST EVAL & MGMT  07/01/2020   IR RADIOLOGIST EVAL & MGMT  08/25/2020   LUMBAR LAMINECTOMY  10/30/2011   Procedure: MICRODISCECTOMY LUMBAR LAMINECTOMY;  Surgeon: Jessy Oto, MD;  Location: [place];  Service: Orthopedics;  Laterality: Right;  Right L4-5 and L5-S1 Microdiscectomy   LUMBAR LAMINECTOMY  06/17/2012   Procedure: MICRODISCECTOMY  LUMBAR LAMINECTOMY;  Surgeon: Jessy Oto, MD;  Location: [place];  Service: Orthopedics;  Laterality: N/A;  Right L5-S1 microdiscectomy   OPEN PARTIAL HEPATECTOMY  N/A 02/26/2020   Procedure: OPEN PARTIAL HEPATECTOMY, CHOLECYSTECTOMY;  Surgeon: Stark Klein, MD;  Location[facility];  Service: General;  Laterality: N/A;   POLYPECTOMY     PROSTATE SURGERY  march 2012   seed implant   RADIOFREQUENCY ABLATION N/A 07/21/2020   Procedure: CT MICROWAVE ABLATION;  Surgeon: Criselda Peaches, MD;  Location: [facility];  Service: Anesthesiology;  Laterality: N/A;   ROBOTIC ASSITED PARTIAL NEPHRECTOMY Right 09/08/2015   Procedure: XI ROBOTIC ASSITED RIGHT PARTIAL NEPHRECTOMY;  Surgeon: [name], MD;  Location[facility];  Service: Urology;  Laterality: Right;  Clamp on: 1033 Clamp off: 1103 Total Clamp Time: 30 minutes   SHOULDER  ARTHROSCOPY WITH SUBACROMIAL DECOMPRESSION Left 01/20/2016   Procedure: LEFT SHOULDER ARTHROSCOPY WITH EXTENSIVE  DEBRIDEMENT, ACROMIOPLASTY;  Surgeon: Ninetta Lights, MD;  Location: [place];  Service: Orthopedics;  Laterality: Left;   SHOULDER SURGERY  LFT   Stress Cardiolite  12/24/2003   negative for ischemia   UPPER ESOPHAGEAL ENDOSCOPIC ULTRASOUND (EUS)  04/18/2016   [facility]   UPPER GASTROINTESTINAL ENDOSCOPY     WOUND EXPLORATION     management of a wound that he sustained in the [place]   Patient Active Problem List   Diagnosis Date Noted   Hx of completed stroke 04/28/2022   Abnormal brain CT 04/07/2022   Encounter for general adult medical examination with abnormal findings 01/23/2022   Anemia due to zinc deficiency 12/20/2021   Low back pain 12/15/2021   Symptomatic anemia 12/15/2021   Cancer related pain 12/15/2021   Chronic heart failure with preserved ejection fraction (HFpEF) ([place]) 08/12/2021   Tinea cruris 08/12/2021   Anemia due to chronic kidney disease 07/20/2021   CKD (chronic kidney disease) stage 5, GFR less than 15 ml/min (HCC) 05/24/2021   BPH (benign prostatic hyperplasia) 05/24/2021   Diabetic renal disease ([place]) 05/20/2021   Dry eye syndrome of bilateral lacrimal glands 05/20/2021   Senile nuclear sclerosis 05/20/2021   Vitamin D deficiency 05/20/2021   Vitreous degeneration, unspecified eye 05/20/2021   Intrinsic eczema 04/18/2021   Atherosclerosis of aorta ([place]) 12/30/2020   Mixed conductive and sensorineural hearing loss of left ear with restricted hearing of right ear 09/01/2020   Tinnitus of both ears 05/20/2020   Metastatic renal cell carcinoma ([place]) 02/26/2020   Diabetic polyneuropathy associated with type 2 diabetes mellitus ([place]) 12/18/2019   Murmur, cardiac 10/08/2018   DDD (degenerative disc disease), cervical 10/08/2018   OAB (overactive bladder) 04/04/2018   Seasonal allergic rhinitis due to pollen 11/01/2016    Microalbuminuria due to type 2 diabetes mellitus ([place]) 07/27/2016   Varicose veins of both lower extremities 08/06/2015   Varicose veins of lower extremities with ulcer ([place]) 06/16/2015   Internal hemorrhoid, bleeding 08/03/2014   Post traumatic stress disorder (PTSD) 01/29/2014   Spinal stenosis of lumbar region with radiculopathy 10/30/2011    Class: Chronic   Obstructive sleep apnea 11/24/2010   Obesity 11/24/2010   Routine general medical examination at a health care facility 11/24/2010   ADENOCARCINOMA, PROSTATE, GLEASON GRADE 6 12/21/2009   Type 2 diabetes mellitus with diabetic cataract ([place]) 03/16/2007   Essential hypertension 03/16/2007   GERD 03/16/2007    ONSET DATE: 04/07/22  REFERRING DIAG: 04/26/22  THERAPY DIAG:  Other lack of coordination - Plan: Ot plan of care  cert/re-cert  Other abnormalities of gait and mobility - Plan: Ot plan of care cert/re-cert  Muscle weakness (generalized) - Plan: Ot plan of care cert/re-cert  Unsteadiness on feet - Plan: Ot plan of care cert/re-cert  Visuospatial deficit - Plan: Ot plan of care cert/re-cert  Attention and concentration deficit - Plan: Ot plan of care cert/re-cert  Frontal lobe and executive function deficit - Plan: Ot plan of care cert/re-cert  Rationale for Evaluation and Treatment: Rehabilitation  SUBJECTIVE:   SUBJECTIVE STATEMENT: Pt wants to get better Pt accompanied by: significant other fiancee [name]  PERTINENT HISTORY: 76 y.o. R-handed male with history of DM2 with diabetic polyneuropathy, OSA on nightly CPAP, PTSD, history of lumbar stenosis with chronic back pain, metastatic renal carcinoma with lymphadenopathy and questionable hepatic involvement noted in 2022, not on chemo due to poor tolerance to the medications, and without significant progression since May 2023, only on surveillance imaging, stage IV, eczema, vitamin D deficiency, CKD stage V refusing HD, BPH, anemia of CKD  Zinc deficiency, chronic  heart failure, mild cognitive impairment, s/p left PCA territory acute CVA on 04/07/22  DIAGNOSTIC FINDINGS:  MRI of the brain showing a left PCA large infarct and punctate right PCA infarct MRA remarkable for left PCA P3 branch occlusion, ICA siphon atherosclerosis with possible tandem significant stenosis of the right siphon  PRECAUTIONS: Fall, hx of CA  WEIGHT BEARING RESTRICTIONS: No  PAIN:  Are you having pain? No  FALLS: Has patient fallen in last 6 months? Yes. Number of falls 1  LIVING ENVIRONMENT: Lives with: lives alone, fiancee is there most of the time Lives in: House/apartment Stairs: 1 level stairs to enter Has following equipment at home: Single point cane  PLOF: Independent  PATIENT GOALS: to get right  OBJECTIVE:   HAND DOMINANCE: Right  ADLs: Overall ADLs: fall risk in standing Transfers/ambulation related to [place] Eating: mod I Grooming: mod I UB Dressing: mod I  LB Dressing: supervision, increased time required, therapist recommends pt sits for LB dressing due to fall risk Toileting: mod I using cane Bathing: supervision Tub Shower transfers: min A currently standing in shower, no grab bars, using tub shower does not have seat Equipment: none  IADLs: Shopping: has assistance Light housekeeping: fiancee handles Meal Prep: does not cook Community mobility: ambulates with cane, supervision-minguard, unsteady and walks close to items Medication management: fiancee is helping with meds Financial management: fiancee is assisting  Handwriting: 100% legible  MOBILITY STATUS:  ambulates with minguard with cane , some unsteadiness and walks close to walls and doorways     FUNCTIONAL OUTCOME MEASURES: FOTO: TBA  UPPER EXTREMITY ROM:  remaining AROM is WFLS   Active ROM Right eval Left eval  Shoulder flexion 105 105  Shoulder abduction    Shoulder adduction    Shoulder extension    Shoulder internal rotation    Shoulder external  rotation    Elbow flexion    Elbow extension -15 -20  Wrist flexion    Wrist extension    Wrist ulnar deviation    Wrist radial deviation    Wrist pronation    Wrist supination    (Blank rows = not tested)  UPPER EXTREMITY MMT:     MMT Right eval Left eval  Shoulder flexion 4-/5 3+/5  Shoulder abduction    Shoulder adduction    Shoulder extension    Shoulder internal rotation    Shoulder external rotation    Middle trapezius    Lower trapezius  Elbow flexion 4+/5 4+/5  Elbow extension 4/5 4/5  Wrist flexion    Wrist extension    Wrist ulnar deviation    Wrist radial deviation    Wrist pronation    Wrist supination    (Blank rows = not tested)  HAND FUNCTION: Grip strength: Right: 46.9 lbs; Left: 40.3 lbs  COORDINATION: 9 Hole Peg test: Right: 44 sec; Left: 40 sec   SENSATION: Light touch: Impaired  for ring and small finger R hand ulnar distribution, reports this was since CA treatment  EDEMA: n/a    COGNITION: Overall cognitive status: Impaired Decreased short term memory, delayed processing and decreased awareness  VISION:  Baseline vision: Wears glasses all the time   VISION ASSESSMENT: Number cancellation task 1.5 M : 7/80 missed, 91% Visual Fields: per hospital records R homonymous hemianopsia  Vision to be further assessed in a functional context  Patient has difficulty with following activities due to following visual impairments: ambulates close to walls and doorways on R side    OBSERVATIONS: Pt with minimal verbalizations and poor awareness of deficits   TODAY'S TREATMENT:                                                                                                                              DATE: n/a   PATIENT EDUCATION: Education details: role of OT and potential OT goals, recommendation that pt's fiancee walks on pt's R side when in the community so pt does not bump or trip on items Person educated: Patient and  fiancee Education method: Explanation Education comprehension: verbalized understanding  HOME EXERCISE PROGRAM: N/a   GOALS: Goals reviewed with patient? Yes  SHORT TERM GOALS: Target date: 06/02/22  I with initial HEP : Goal status: INITIAL  2.  Pt will perform tub transfers safely mod I using tub bench prn. Baseline: doe not have tub bench yet, currently supervision, min A Goal status: INITIAL  3.  Pt/caregiver will verbalize understanding of compensations for visual deficits  Goal status: INITIAL  4.  Pt will demonstrate improved fine motor coordination  for ADLs with RUE as evidenced by decreasing 9 hole peg test score by 3 secs Baseline: 9 Hole Peg test: Right: 44 sec; Left: 40 sec Goal status: INITIAL  5.   Pt will navigate a busy environment and locate items with 75% accuracy without bumping into items. Baseline: nearly bumps into wall and doorway on right side Goal status: INITIAL  6.  Pt will verbalize understanding of compensations for short term memory deficits. Baseline:  Goal status: INITIAL  LONG TERM GOALS: Target date: 07/25/21  I with updated HEP Baseline:  Goal status: INITIAL  2.  Pt will navigate a busy environment and locate items with 90% accuracy. Baseline: nearly bumps into wall and doorway on right side Goal status: INITIAL  3.  Pt will perform all basic ADLs mod I with good safety awareness. Baseline: supervision, min A Goal status: INITIAL  4.  Pt will demonstrate improved fine motor coordination with RUE  for ADLs as evidenced by decreasing 9 hole peg test score by 6 secs form initial eval. Baseline: 9 Hole Peg test: Right: 44 sec; Left: 40 sec Goal status: INITIAL  5.  Pt will demonstrate ability to retrieve lightweight object at 110* shoulder flexion and -10 elbow extension with RUE Baseline: shoulder flexion 105, elbow extension -20 Goal status: INITIAL  6.  Pt will demonstrate ability to retrieve lightweight object at 110*  shoulder flexion and -15 elbow extension with LUE Baseline: shoulder flexion 105, elbow extension -20 Goal status: inital  ASSESSMENT:  CLINICAL IMPRESSION:  76 y.o. R-handed male s/p left PCA territory acute CVA on 04/07/22 with history of DM2 with diabetic polyneuropathy, OSA on nightly CPAP, PTSD, history of lumbar stenosis with chronic back pain, metastatic renal carcinoma with lymphadenopathy and questionable hepatic involvement noted in 2022, not on chemo due to poor tolerance to the medications, and without significant progression since May 2023, only on surveillance imaging, stage IV, eczema, vitamin D deficiency, CKD stage V refusing HD, BPH, anemia of CKD  Zinc deficiency, chronic heart failure, mild cognitive impairment,  PERFORMANCE DEFICITS: in functional skills including ADLs, IADLs, coordination, dexterity, proprioception, sensation, tone, ROM, strength, flexibility, Fine motor control, Gross motor control, mobility, balance, endurance, decreased knowledge of precautions, decreased knowledge of use of DME, vision, and UE functional use, cognitive skills including energy/drive, learn, memory, problem solving, safety awareness, thought, and understand, and psychosocial skills including coping strategies, environmental adaptation, habits, interpersonal interactions, and routines and behaviors.   IMPAIRMENTS: are limiting patient from ADLs, IADLs, rest and sleep, play, leisure, and social participation.   CO-MORBIDITIES: may have co-morbidities  that affects occupational performance. Patient will benefit from skilled OT to address above impairments and improve overall function.  MODIFICATION OR ASSISTANCE TO COMPLETE EVALUATION: No modification of tasks or assist necessary to complete an evaluation.  OT OCCUPATIONAL PROFILE AND HISTORY: Detailed assessment: Review of records and additional review of physical, cognitive, psychosocial history related to current functional  performance.  CLINICAL DECISION MAKING: LOW - limited treatment options, no task modification necessary  REHAB POTENTIAL: Good  EVALUATION COMPLEXITY: Low    PLAN:  OT FREQUENCY: 2x/week  OT DURATION: 12 weeks  PLANNED INTERVENTIONS: self care/ADL training, therapeutic exercise, therapeutic activity, neuromuscular re-education, manual therapy, passive range of motion, gait training, balance training, stair training, functional mobility training, aquatic therapy, splinting, paraffin, fluidotherapy, moist heat, cryotherapy, contrast bath, patient/family education, cognitive remediation/compensation, visual/perceptual remediation/compensation, energy conservation, coping strategies training, DME and/or AE instructions, and Re-evaluation  RECOMMENDED OTHER SERVICES: PT, ST  CONSULTED AND AGREED WITH PLAN OF CARE: Patient and family member/caregiver  PLAN FOR NEXT SESSION: further assess vision in functional context, issue visual compensations in the future-coordination HEP for RUE, cane exercises, supine   Nigel Ericsson, OT 05/03/2022, 11:46 AM

## 2022-05-04 ENCOUNTER — Other Ambulatory Visit: Payer: Self-pay | Admitting: Oncology

## 2022-05-09 ENCOUNTER — Ambulatory Visit (HOSPITAL_COMMUNITY)
Admission: RE | Admit: 2022-05-09 | Discharge: 2022-05-09 | Disposition: A | Discharge status: Home / Self Care | Payer: Medicare Other | Source: Ambulatory Visit | Attending: Nephrology | Admitting: Nephrology

## 2022-05-09 VITALS — BP 165/96 | HR 85 | Temp 97.1°F | Resp 18

## 2022-05-09 DIAGNOSIS — N185 Chronic kidney disease, stage 5: Secondary | ICD-10-CM | POA: Insufficient documentation

## 2022-05-09 LAB — IRON AND TIBC
Iron: 89 ug/dL (ref 45–182)
Saturation Ratios: 33 % (ref 17.9–39.5)
TIBC: 272 ug/dL (ref 250–450)
UIBC: 183 ug/dL

## 2022-05-09 LAB — FERRITIN: Ferritin: 695 ng/mL — ABNORMAL HIGH (ref 24–336)

## 2022-05-09 MED ORDER — EPOETIN ALFA-EPBX 40000 UNIT/ML IJ SOLN
30000.0000 [IU] | INTRAMUSCULAR | Status: DC
  Administered 2022-05-09: 30000 [IU] via SUBCUTANEOUS

## 2022-05-09 MED ORDER — EPOETIN ALFA-EPBX 40000 UNIT/ML IJ SOLN
INTRAMUSCULAR | Status: AC
  Filled 2022-05-09: qty 1

## 2022-05-10 LAB — POCT HEMOGLOBIN-HEMACUE: Hemoglobin: 8.2 g/dL — ABNORMAL LOW (ref 13.0–17.0)

## 2022-05-23 ENCOUNTER — Ambulatory Visit (HOSPITAL_COMMUNITY)
Admission: RE | Admit: 2022-05-23 | Discharge: 2022-05-23 | Disposition: A | Discharge status: Home / Self Care | Payer: Medicare Other | Source: Ambulatory Visit | Attending: Nephrology | Admitting: Nephrology

## 2022-05-23 ENCOUNTER — Ambulatory Visit: Payer: Medicare Other | Admitting: Physical Therapy

## 2022-05-23 ENCOUNTER — Ambulatory Visit: Payer: Medicare Other | Admitting: Speech Pathology

## 2022-05-23 VITALS — BP 161/96 | HR 76 | Temp 99.1°F | Resp 17

## 2022-05-23 DIAGNOSIS — N185 Chronic kidney disease, stage 5: Secondary | ICD-10-CM | POA: Diagnosis not present

## 2022-05-23 LAB — POCT HEMOGLOBIN-HEMACUE: Hemoglobin: 9.1 g/dL — ABNORMAL LOW (ref 13.0–17.0)

## 2022-05-23 MED ORDER — EPOETIN ALFA-EPBX 40000 UNIT/ML IJ SOLN
INTRAMUSCULAR | Status: AC
  Filled 2022-05-23: qty 1

## 2022-05-23 MED ORDER — EPOETIN ALFA-EPBX 40000 UNIT/ML IJ SOLN
30000.0000 [IU] | INTRAMUSCULAR | Status: DC
  Administered 2022-05-23: 30000 [IU] via SUBCUTANEOUS

## 2022-05-25 ENCOUNTER — Ambulatory Visit: Payer: Medicare Other | Attending: Internal Medicine | Admitting: Physical Therapy

## 2022-05-25 ENCOUNTER — Ambulatory Visit: Payer: Medicare Other | Admitting: Speech Pathology

## 2022-05-25 ENCOUNTER — Other Ambulatory Visit: Payer: Self-pay | Admitting: Oncology

## 2022-05-25 DIAGNOSIS — R41844 Frontal lobe and executive function deficit: Secondary | ICD-10-CM | POA: Insufficient documentation

## 2022-05-25 DIAGNOSIS — R41841 Cognitive communication deficit: Secondary | ICD-10-CM | POA: Diagnosis present

## 2022-05-25 DIAGNOSIS — R278 Other lack of coordination: Secondary | ICD-10-CM

## 2022-05-25 DIAGNOSIS — R41842 Visuospatial deficit: Secondary | ICD-10-CM | POA: Insufficient documentation

## 2022-05-25 DIAGNOSIS — R2681 Unsteadiness on feet: Secondary | ICD-10-CM | POA: Diagnosis not present

## 2022-05-25 DIAGNOSIS — N185 Chronic kidney disease, stage 5: Secondary | ICD-10-CM | POA: Diagnosis not present

## 2022-05-25 DIAGNOSIS — M6281 Muscle weakness (generalized): Secondary | ICD-10-CM

## 2022-05-25 DIAGNOSIS — R41840 Attention and concentration deficit: Secondary | ICD-10-CM | POA: Insufficient documentation

## 2022-05-25 DIAGNOSIS — R2689 Other abnormalities of gait and mobility: Secondary | ICD-10-CM

## 2022-05-25 NOTE — Therapy (Addendum)
OUTPATIENT SPEECH LANGUAGE PATHOLOGY TREATMENT   Patient Name: Antonio Adams MRN: [id number] DOB:1945/10/03, 77 y.o., male Today's Date: 05/25/2022  PCP: [name]., MD REFERRING PROVIDER: Hoyt Koch., MD  END OF SESSION:  End of Session - 05/25/22 0932     Visit Number 2    Number of Visits 25    Date for SLP Re-Evaluation 07/26/22    Authorization Type [employer] Medicare    SLP Start Time 0932    SLP Stop Time  9470    SLP Time Calculation (min) 43 min    Activity Tolerance Patient tolerated treatment well             Past Medical History:  Diagnosis Date   ADENOCARCINOMA, PROSTATE, GLEASON GRADE 6 12/21/2009   prostate cancer   ALLERGIC RHINITIS 03/16/2007   Allergy    Anxiety    Arthritis    back    BACK PAIN WITH RADICULOPATHY 11/23/2008   Cancer of kidney ([place])    partial right kidney removed   CARPAL TUNNEL SYNDROME, BILATERAL 03/16/2007   Chronic back pain    Constipation    takes Senokot daily   Depression    occasionally   DIABETES MELLITUS, TYPE II 03/16/2007   takes Januvia and MEtformin daily   GASTROINTESTINAL HEMORRHAGE, HX OF 03/16/2007   GERD 03/16/2007   takes Omeprazole daily   Glaucoma    mild - no eye drops   Hemorrhoids    History of colon polyps    HYPERLIPIDEMIA 03/16/2007   takes Crestor daily   HYPERTENSION 03/16/2007   takes Diltiazem and Lisinopril daily    Kidney cancer, primary, with metastasis from kidney to other site [facility])    LEG CRAMPS 05/22/2007   Nocturia    Overactive bladder    PARESTHESIA 05/21/2007   Proctitis    PTSD (post-traumatic stress disorder)    wakes up may be dreaming of fighting   Rectal bleeding    Dr.Norins has explained its from the Radiation that he has received   Rectal bleeding    Renal cell carcinoma ([place])    Sleep apnea    uses CPAP nightly   Stomach cancer ([place])    Urinary frequency    takes Toviaz daily   Past Surgical History:  Procedure Laterality Date   ADRENALECTOMY  Right 02/26/2020   Procedure: OPEN RIGHT RENAL CELL METASTATECTOMY AND  ADRENALECTOMY;  Surgeon: [name], MD;  Location: [facility];  Service: Urology;  Laterality: Right;   [place], 2008   repeat surgery; ESI '08   [place]     bilateral   CERVICAL FUSION     COLONOSCOPY     colonosocpy     ESOPHAGOGASTRODUODENOSCOPY     ED   FLEXIBLE SIGMOIDOSCOPY N/A 01/08/2017   Procedure: FLEXIBLE SIGMOIDOSCOPY;  Surgeon: Irene Shipper, MD;  Location: [place];  Service: Endoscopy;  Laterality: N/A;   HOT HEMOSTASIS N/A 01/08/2017   Procedure: HOT HEMOSTASIS (ARGON PLASMA COAGULATION/BICAP);  Surgeon: Irene Shipper, MD;  Location: [facility];  Service: Endoscopy;  Laterality: N/A;   IR RADIOLOGIST EVAL & MGMT  12/17/2019   IR RADIOLOGIST EVAL & MGMT  07/01/2020   IR RADIOLOGIST EVAL & MGMT  08/25/2020   LUMBAR LAMINECTOMY  10/30/2011   Procedure: MICRODISCECTOMY LUMBAR LAMINECTOMY;  Surgeon: Jessy Oto, MD;  Location: [place];  Service: Orthopedics;  Laterality: Right;  Right L4-5 and L5-S1 Microdiscectomy   LUMBAR LAMINECTOMY  06/17/2012  Procedure: MICRODISCECTOMY LUMBAR LAMINECTOMY;  Surgeon: Jessy Oto, MD;  Location: [place];  Service: Orthopedics;  Laterality: N/A;  Right L5-S1 microdiscectomy   OPEN PARTIAL HEPATECTOMY  N/A 02/26/2020   Procedure: OPEN PARTIAL HEPATECTOMY, CHOLECYSTECTOMY;  Surgeon: Stark Klein, MD;  Location: [facility];  Service: General;  Laterality: N/A;   POLYPECTOMY     PROSTATE SURGERY  march 2012   seed implant   RADIOFREQUENCY ABLATION N/A 07/21/2020   Procedure: CT MICROWAVE ABLATION;  Surgeon: Criselda Peaches, MD;  Location: [facility];  Service: Anesthesiology;  Laterality: N/A;   ROBOTIC ASSITED PARTIAL NEPHRECTOMY Right 09/08/2015   Procedure: XI ROBOTIC ASSITED RIGHT PARTIAL NEPHRECTOMY;  Surgeon: [name], MD;  Location: [facility];  Service: Urology;  Laterality: Right;  Clamp on: 1033 Clamp off: 1103 Total Clamp Time: 30  minutes   SHOULDER ARTHROSCOPY WITH SUBACROMIAL DECOMPRESSION Left 01/20/2016   Procedure: LEFT SHOULDER ARTHROSCOPY WITH EXTENSIVE  DEBRIDEMENT, ACROMIOPLASTY;  Surgeon: Ninetta Lights, MD;  Location: [place];  Service: Orthopedics;  Laterality: Left;   SHOULDER SURGERY  LFT   Stress Cardiolite  12/24/2003   negative for ischemia   UPPER ESOPHAGEAL ENDOSCOPIC ULTRASOUND (EUS)  04/18/2016   [facility]   UPPER GASTROINTESTINAL ENDOSCOPY     WOUND EXPLORATION     management of a wound that he sustained in the [place]   Patient Active Problem List   Diagnosis Date Noted   Hx of completed stroke 04/28/2022   Abnormal brain CT 04/07/2022   Encounter for general adult medical examination with abnormal findings 01/23/2022   Anemia due to zinc deficiency 12/20/2021   Low back pain 12/15/2021   Symptomatic anemia 12/15/2021   Cancer related pain 12/15/2021   Chronic heart failure with preserved ejection fraction (HFpEF) ([place]) 08/12/2021   Tinea cruris 08/12/2021   Anemia due to chronic kidney disease 07/20/2021   CKD (chronic kidney disease) stage 5, GFR less than 15 ml/min (HCC) 05/24/2021   BPH (benign prostatic hyperplasia) 05/24/2021   Diabetic renal disease ([place]) 05/20/2021   Dry eye syndrome of bilateral lacrimal glands 05/20/2021   Senile nuclear sclerosis 05/20/2021   Vitamin D deficiency 05/20/2021   Vitreous degeneration, unspecified eye 05/20/2021   Intrinsic eczema 04/18/2021   Atherosclerosis of aorta ([place]) 12/30/2020   Mixed conductive and sensorineural hearing loss of left ear with restricted hearing of right ear 09/01/2020   Tinnitus of both ears 05/20/2020   Metastatic renal cell carcinoma ([place]) 02/26/2020   Diabetic polyneuropathy associated with type 2 diabetes mellitus ([place]) 12/18/2019   Murmur, cardiac 10/08/2018   DDD (degenerative disc disease), cervical 10/08/2018   OAB (overactive bladder) 04/04/2018   Seasonal allergic rhinitis due to  pollen 11/01/2016   Microalbuminuria due to type 2 diabetes mellitus ([place]) 07/27/2016   Varicose veins of both lower extremities 08/06/2015   Varicose veins of lower extremities with ulcer ([place]) 06/16/2015   Internal hemorrhoid, bleeding 08/03/2014   Post traumatic stress disorder (PTSD) 01/29/2014   Spinal stenosis of lumbar region with radiculopathy 10/30/2011    Class: Chronic   Obstructive sleep apnea 11/24/2010   Obesity 11/24/2010   Routine general medical examination at a health care facility 11/24/2010   ADENOCARCINOMA, PROSTATE, GLEASON GRADE 6 12/21/2009   Type 2 diabetes mellitus with diabetic cataract ([place]) 03/16/2007   Essential hypertension 03/16/2007   GERD 03/16/2007    ONSET DATE: 04-07-22   REFERRING DIAG: Z61.09 (ICD-10-CM) - Personal history of transient ischemic attack (TIA), and cerebral  infarction without residual deficits   THERAPY DIAG:  Cognitive communication deficit  Rationale for Evaluation and Treatment: Rehabilitation  SUBJECTIVE:   SUBJECTIVE STATEMENT: "Trying to do ok"   PAIN:  Are you having pain? No  OBJECTIVE:   STANDARDIZED ASSESSMENTS: SLP initiated CLQT: Memory: Severe and Language: Moderate  PATIENT REPORTED OUTCOME MEASURES (PROM): Deferred d/t time constraints, to be completed subsequent ST session   TODAY'S TREATMENT:  05-25-22: PT ambulates into ST office, with occasional demonstration of impaired balance. Pt denies headache, speech presents as WNL. SLP takes pt's BP, with initial reading of 161/91, subsequent reading after 10 minute latency period of 162/94. Pt reports did not take morning medications d/t not having time for breakfast. SLP advised pt will not be able to be seen for PT d/t raised BP and pt should routinely take medications at least 1 hr prior to therapy appointments. Pt and s/o verbalize agreement and undertanding. Pt agreeable to completion of PROM. SLP A in completion of Cognitive Function PROM with approx half  completed. SLP elected to cease administration d/t pt selecting "0" or "none" for all items despite s/o providing examples of impairments evidenced. Instead asked for input from s/o of functional goals with the following ID: Med administration adherence Med sorting Orientation to daily tasks and schedule  Memory for intended actions or verbal expression Ability to navigate to and from places in community  Will plan to complete standardized cognitive assessment at next session when pt's BP is controled.   05-03-22: Education provided on evaluation results and SLP's recommendations. Initiated training re: orientation aids. Pt verbalizes agreement with POC, all questions answered to satisfaction.     PATIENT EDUCATION: Education details: see above Person educated: Patient and Spouse Education method: Explanation, Demonstration, and Verbal cues Education comprehension: verbalized understanding, returned demonstration, and needs further education   GOALS: Goals reviewed with patient? Yes  SHORT TERM GOALS: Target date: 05-31-21  Pt will complete CLQT and PROM initial therapy session Baseline: Goal status: INITIAL  2.  Pt will accurately sort medications with use of compensations PRN into pill box with occasional mod-A Baseline:  Goal status: INITIAL  3.  Pt will generate visual aid to assist with increased independence in paying bills with A from SLP and s/o Baseline:  Goal status: INITIAL  4.  Pt will verbalize appropriate memory strategy given functional scenario with 80% accuracy   Baseline:  Goal status: INITIAL  5.  Pt and s/o will implement external A for orientation (e.g. calendar) with usual cues from care partner to initiate use  Baseline:  Goal status: INITIAL   LONG TERM GOALS: Target date: 07-26-22  With use of external aid, pt will answer orientation questions with 100% accuracy over 2 sessions Baseline:  Goal status: INITIAL  2.  Pt will verbalize x3  situations in which he successfully employed a memory strategy resulting in subjective satisfaction with ability to recall desired information Baseline:  Goal status: INITIAL  3.  Pt will pay x3 of own bills with supervision from s/o, with use of compensations PRN Baseline:  Goal status: INITIAL  4.  Pt will report improvement via PROM by dc Baseline:  Goal status: INITIAL  ASSESSMENT:  CLINICAL IMPRESSION: Patient is a 77 y.o. M who was seen today for cognitive linguistic evaluation s/p stroke. Mr. Petrovich presents with moderate cognitive impairment in areas of orientation, awareness, memory, executive functioning. Language marked as severe per CLQT, however pt and caregiver insistent that communication not impacted by stroke.  Overall reduced verbal output and resistant to speak during evaluation in response to SLP questions/comments. Pt requiring assistance from significant other to manage medication, schedule, and finances. Prior to stroke pt was fully independent. Pt would benefit from skilled ST to facilitate increased independence, enhance QoL, and reduced caregiver burden.    OBJECTIVE IMPAIRMENTS: include attention, memory, awareness, executive functioning, and expressive language. These impairments are limiting patient from managing medications, managing appointments, managing finances, household responsibilities, and ADLs/IADLs. Factors affecting potential to achieve goals and functional outcome are ability to learn/carryover information and cooperation/participation level.. Patient will benefit from skilled SLP services to address above impairments and improve overall function.  REHAB POTENTIAL: Fair (carryover and participation)  PLAN:  SLP FREQUENCY: 2x/week  SLP DURATION: 12 weeks  PLANNED INTERVENTIONS: Cueing hierachy, Cognitive reorganization, Internal/external aids, Functional tasks, SLP instruction and feedback, Compensatory strategies, and Patient/family  education    Su Monks, CCC-SLP 05/25/2022, 9:33 AM

## 2022-05-25 NOTE — Therapy (Signed)
[facility] [address] [place], [place], [address] Phone: [phone]   Fax:  [phone]  Patient Details -ARRIVED NO CHARGE. Name: Antonio Adams MRN: [id number] Date of Birth: 1946/04/26 Referring Provider:  [name], MD  Encounter Date: 05/25/2022  BP significantly elevated w/ ST prior to PT, not safe for PT as pt has not taken BP meds today.  See ST note for further details.  Bary Richard, PT, DPT 05/25/2022, 10:45 AM  [facility] [address] [place], [place], [address] Phone: [phone]   Fax:  [phone]

## 2022-05-28 ENCOUNTER — Encounter: Payer: Self-pay | Admitting: Internal Medicine

## 2022-05-28 NOTE — Progress Notes (Unsigned)
Subjective:    Patient ID: Antonio Adams, male    DOB: 02-May-1946, 77 y.o.   MRN: [id number]      HPI Dontrez is here for No chief complaint on file.   Stomach issues -      Medications and allergies reviewed with patient and updated if appropriate.  Current Outpatient Medications on File Prior to Visit  Medication Sig Dispense Refill   acetaminophen (TYLENOL) 500 MG tablet Take 1,000 mg by mouth every 6 (six) hours as needed for mild pain.     Apoaequorin (PREVAGEN EXTRA STRENGTH PO) Take 1 tablet by mouth daily.     aspirin EC 81 MG tablet Take 1 tablet (81 mg total) by mouth daily. Swallow whole. 30 tablet 12   calcitRIOL (ROCALTROL) 0.25 MCG capsule Take 0.25 mcg by mouth daily.     clopidogrel (PLAVIX) 75 MG tablet Take 1 tablet (75 mg total) by mouth daily. Take Plavix along with aspirin for 3 months, after 3 months stop Plavix and just continue on aspirin. 30 tablet 2   Cyanocobalamin (B-12 PO) Take 1,000 mcg by mouth daily.     cyclobenzaprine (FLEXERIL) 5 MG tablet Take 1 tablet (5 mg total) by mouth 2 (two) times daily as needed for muscle spasms. 6 tablet 0   ferrous sulfate 325 (65 FE) MG tablet Take 1 tablet (325 mg total) by mouth 2 (two) times daily. Patient needs office visit for further refills (Patient taking differently: Take 325 mg by mouth daily with breakfast.) 60 tablet 1   fluocinonide-emollient (LIDEX-E) 0.05 % cream Apply 1 Application topically 2 (two) times daily as needed (dry skin).     furosemide (LASIX) 40 MG tablet TAKE 1 TABLET BY MOUTH EVERY DAY (Patient taking differently: Take 40 mg by mouth in the morning, at noon, and at bedtime.) 90 tablet 1   gemfibrozil (LOPID) 600 MG tablet Take 600 mg by mouth 2 (two) times daily before a meal.     LOKELMA 10 g PACK packet Take 10 g by mouth daily.     megestrol (MEGACE) 40 MG/ML suspension TAKE 10 MLS (400 MG TOTAL) BY MOUTH DAILY. 240 mL 0   metoprolol tartrate (LOPRESSOR) 25 MG tablet Take 1  tablet (25 mg total) by mouth 2 (two) times daily. 180 tablet 3   Omega-3 Fatty Acids (FISH OIL PO) Take 1,000 mg by mouth 2 (two) times a week.     OVER THE COUNTER MEDICATION Take 1 tablet by mouth daily. Focus factor     pantoprazole (PROTONIX) 20 MG tablet Take 1 tablet (20 mg total) by mouth daily. (Patient taking differently: Take 20 mg by mouth daily as needed for heartburn or indigestion.) 30 tablet 0   polyvinyl alcohol (LIQUIFILM TEARS) 1.4 % ophthalmic solution Place 1 drop into both eyes daily as needed for dry eyes.     rosuvastatin (CRESTOR) 20 MG tablet Take 1 tablet (20 mg total) by mouth daily. 30 tablet 1   sodium bicarbonate 650 MG tablet Take 2 tablets (1,300 mg total) by mouth 2 (two) times daily. 60 tablet 1   solifenacin (VESICARE) 5 MG tablet TAKE 1 TABLET (5 MG TOTAL) BY MOUTH DAILY. 90 tablet 1   tamsulosin (FLOMAX) 0.4 MG CAPS capsule TAKE 1 CAPSULE (0.4 MG TOTAL) BY MOUTH DAILY. (Patient taking differently: Take 0.4 mg by mouth daily.) 30 capsule 1   zinc gluconate 50 MG tablet Take 1 tablet (50 mg total) by mouth daily. 90 tablet 1  No current facility-administered medications on file prior to visit.    Review of Systems     Objective:  There were no vitals filed for this visit. BP Readings from Last 3 Encounters:  05/23/22 (!) 161/96  05/09/22 (!) 165/96  04/25/22 (!) 148/80   Wt Readings from Last 3 Encounters:  04/25/22 206 lb (93.4 kg)  04/20/22 206 lb 8 oz (93.7 kg)  04/12/22 191 lb (86.6 kg)   There is no height or weight on file to calculate BMI.    Physical Exam         Assessment & Plan:    See Problem List for Assessment and Plan of chronic medical problems.

## 2022-05-29 ENCOUNTER — Ambulatory Visit (INDEPENDENT_AMBULATORY_CARE_PROVIDER_SITE_OTHER): Payer: Medicare Other | Admitting: Internal Medicine

## 2022-05-29 VITALS — BP 142/80 | HR 68 | Temp 98.6°F | Ht 75.0 in | Wt 194.4 lb

## 2022-05-29 DIAGNOSIS — R1031 Right lower quadrant pain: Secondary | ICD-10-CM

## 2022-05-29 DIAGNOSIS — G8929 Other chronic pain: Secondary | ICD-10-CM | POA: Diagnosis not present

## 2022-05-29 MED ORDER — DULOXETINE HCL 20 MG PO CPEP: 20.0000 mg | ORAL_CAPSULE | Freq: Every day | ORAL | 5 refills | Status: DC

## 2022-05-29 NOTE — Patient Instructions (Addendum)
        Medications changes include :   cymbalta 20 mg daily     Return for 2-3 follow up nerve pain with Dr Ronnald Ramp.

## 2022-05-30 ENCOUNTER — Ambulatory Visit: Payer: Medicare Other | Admitting: Occupational Therapy

## 2022-05-30 ENCOUNTER — Ambulatory Visit: Payer: Medicare Other | Admitting: Physical Therapy

## 2022-05-30 ENCOUNTER — Encounter: Payer: Self-pay | Admitting: Occupational Therapy

## 2022-05-30 ENCOUNTER — Ambulatory Visit: Payer: Medicare Other | Admitting: Speech Pathology

## 2022-05-30 ENCOUNTER — Encounter: Payer: Self-pay | Admitting: Physical Therapy

## 2022-05-30 VITALS — BP 156/89 | HR 75

## 2022-05-30 DIAGNOSIS — R2681 Unsteadiness on feet: Secondary | ICD-10-CM

## 2022-05-30 DIAGNOSIS — R278 Other lack of coordination: Secondary | ICD-10-CM | POA: Diagnosis not present

## 2022-05-30 DIAGNOSIS — M6281 Muscle weakness (generalized): Secondary | ICD-10-CM

## 2022-05-30 DIAGNOSIS — R2689 Other abnormalities of gait and mobility: Secondary | ICD-10-CM

## 2022-05-30 DIAGNOSIS — R41841 Cognitive communication deficit: Secondary | ICD-10-CM

## 2022-05-30 DIAGNOSIS — R41842 Visuospatial deficit: Secondary | ICD-10-CM

## 2022-05-30 NOTE — Therapy (Signed)
OUTPATIENT SPEECH LANGUAGE PATHOLOGY TREATMENT   Patient Name: Antonio Adams MRN: [id number] DOB:[birth year], 77 y.o., male Today's Date: 05/30/2022  PCP: [name]., MD REFERRING PROVIDER: Hoyt Koch., MD  END OF SESSION:  End of Session - 05/30/22 1147     Visit Number 3    Number of Visits 25    Date for SLP Re-Evaluation 07/26/22    Authorization Type [employer] Medicare    SLP Start Time 1147    SLP Stop Time  1230    SLP Time Calculation (min) 43 min    Activity Tolerance Patient tolerated treatment well              Past Medical History:  Diagnosis Date   ADENOCARCINOMA, PROSTATE, GLEASON GRADE 6 12/21/2009   prostate cancer   ALLERGIC RHINITIS 03/16/2007   Allergy    Anxiety    Arthritis    back    BACK PAIN WITH RADICULOPATHY 11/23/2008   Cancer of kidney ([place])    partial right kidney removed   CARPAL TUNNEL SYNDROME, BILATERAL 03/16/2007   Chronic back pain    Constipation    takes Senokot daily   Depression    occasionally   DIABETES MELLITUS, TYPE II 03/16/2007   takes Januvia and MEtformin daily   GASTROINTESTINAL HEMORRHAGE, HX OF 03/16/2007   GERD 03/16/2007   takes Omeprazole daily   Glaucoma    mild - no eye drops   Hemorrhoids    History of colon polyps    HYPERLIPIDEMIA 03/16/2007   takes Crestor daily   HYPERTENSION 03/16/2007   takes Diltiazem and Lisinopril daily    Kidney cancer, primary, with metastasis from kidney to other site [facility])    LEG CRAMPS 05/22/2007   Nocturia    Overactive bladder    PARESTHESIA 05/21/2007   Proctitis    PTSD (post-traumatic stress disorder)    wakes up may be dreaming of fighting   Rectal bleeding    Dr.Norins has explained its from the Radiation that he has received   Rectal bleeding    Renal cell carcinoma ([place])    Sleep apnea    uses CPAP nightly   Stomach cancer ([place])    Urinary frequency    takes Toviaz daily   Past Surgical History:  Procedure Laterality Date   ADRENALECTOMY  Right 02/26/2020   Procedure: OPEN RIGHT RENAL CELL METASTATECTOMY AND  ADRENALECTOMY;  Surgeon: [name], MD;  Location: [facility];  Service: Urology;  Laterality: Right;   [place], 2008   repeat surgery; ESI '08   [place]     bilateral   CERVICAL FUSION     COLONOSCOPY     colonosocpy     ESOPHAGOGASTRODUODENOSCOPY     ED   FLEXIBLE SIGMOIDOSCOPY N/A 01/08/2017   Procedure: FLEXIBLE SIGMOIDOSCOPY;  Surgeon: Irene Shipper, MD;  Location: [place];  Service: Endoscopy;  Laterality: N/A;   HOT HEMOSTASIS N/A 01/08/2017   Procedure: HOT HEMOSTASIS (ARGON PLASMA COAGULATION/BICAP);  Surgeon: Irene Shipper, MD;  Location: [facility] END[facility]COPY;  Service: Endoscopy;  Laterality: N/A;   IR RADIOLOGIST EVAL & MGMT  12/17/2019   IR RADIOLOGIST EVAL & MGMT  07/01/2020   IR RADIOLOGIST EVAL & MGMT  08/25/2020   LUMBAR LAMINECTOMY  10/30/2011   Procedure: MICRODISCECTOMY LUMBAR LAMINECTOMY;  Surgeon: Jessy Oto, MD;  Location: [place];  Service: Orthopedics;  Laterality: Right;  Right L4-5 and L5-S1 Microdiscectomy   LUMBAR LAMINECTOMY  06/17/2012   Procedure: MICRODISCECTOMY LUMBAR LAMINECTOMY;  Surgeon: Jessy Oto, MD;  Location: [place];  Service: Orthopedics;  Laterality: N/A;  Right L5-S1 microdiscectomy   OPEN PARTIAL HEPATECTOMY  N/A 02/26/2020   Procedure: OPEN PARTIAL HEPATECTOMY, CHOLECYSTECTOMY;  Surgeon: Stark Klein, MD;  Location: [facility];  Service: General;  Laterality: N/A;   POLYPECTOMY     PROSTATE SURGERY  march 2012   seed implant   RADIOFREQUENCY ABLATION N/A 07/21/2020   Procedure: CT MICROWAVE ABLATION;  Surgeon: Criselda Peaches, MD;  Location: [facility];  Service: Anesthesiology;  Laterality: N/A;   ROBOTIC ASSITED PARTIAL NEPHRECTOMY Right 09/08/2015   Procedure: XI ROBOTIC ASSITED RIGHT PARTIAL NEPHRECTOMY;  Surgeon: [name], MD;  Location: [facility];  Service: Urology;  Laterality: Right;  Clamp on: 1033 Clamp off: 1103 Total Clamp Time: 30  minutes   SHOULDER ARTHROSCOPY WITH SUBACROMIAL DECOMPRESSION Left 01/20/2016   Procedure: LEFT SHOULDER ARTHROSCOPY WITH EXTENSIVE  DEBRIDEMENT, ACROMIOPLASTY;  Surgeon: Ninetta Lights, MD;  Location: [place];  Service: Orthopedics;  Laterality: Left;   SHOULDER SURGERY  LFT   Stress Cardiolite  12/24/2003   negative for ischemia   UPPER ESOPHAGEAL ENDOSCOPIC ULTRASOUND (EUS)  04/18/2016   [facility]   UPPER GASTROINTESTINAL ENDOSCOPY     WOUND EXPLORATION     management of a wound that he sustained in the [place]   Patient Active Problem List   Diagnosis Date Noted   Hx of completed stroke 04/28/2022   Abnormal brain CT 04/07/2022   Encounter for general adult medical examination with abnormal findings 01/23/2022   Anemia due to zinc deficiency 12/20/2021   Low back pain 12/15/2021   Symptomatic anemia 12/15/2021   Cancer related pain 12/15/2021   Chronic heart failure with preserved ejection fraction (HFpEF) ([place]) 08/12/2021   Tinea cruris 08/12/2021   Anemia due to chronic kidney disease 07/20/2021   CKD (chronic kidney disease) stage 5, GFR less than 15 ml/min (HCC) 05/24/2021   BPH (benign prostatic hyperplasia) 05/24/2021   Diabetic renal disease ([place]) 05/20/2021   Dry eye syndrome of bilateral lacrimal glands 05/20/2021   Senile nuclear sclerosis 05/20/2021   Vitamin D deficiency 05/20/2021   Vitreous degeneration, unspecified eye 05/20/2021   Intrinsic eczema 04/18/2021   Atherosclerosis of aorta ([place]) 12/30/2020   Mixed conductive and sensorineural hearing loss of left ear with restricted hearing of right ear 09/01/2020   Tinnitus of both ears 05/20/2020   Metastatic renal cell carcinoma ([place]) 02/26/2020   Diabetic polyneuropathy associated with type 2 diabetes mellitus ([place]) 12/18/2019   Murmur, cardiac 10/08/2018   DDD (degenerative disc disease), cervical 10/08/2018   OAB (overactive bladder) 04/04/2018   Seasonal allergic rhinitis due to  pollen 11/01/2016   Microalbuminuria due to type 2 diabetes mellitus ([place]) 07/27/2016   Varicose veins of both lower extremities 08/06/2015   Varicose veins of lower extremities with ulcer ([place]) 06/16/2015   Internal hemorrhoid, bleeding 08/03/2014   Post traumatic stress disorder (PTSD) 01/29/2014   Spinal stenosis of lumbar region with radiculopathy 10/30/2011    Class: Chronic   Obstructive sleep apnea 11/24/2010   Obesity 11/24/2010   Routine general medical examination at a health care facility 11/24/2010   ADENOCARCINOMA, PROSTATE, GLEASON GRADE 6 12/21/2009   Type 2 diabetes mellitus with diabetic cataract ([place]) 03/16/2007   Essential hypertension 03/16/2007   GERD 03/16/2007    ONSET DATE: 04-07-22   REFERRING DIAG: J00.93 (ICD-10-CM) - Personal history of transient ischemic attack (  TIA), and cerebral infarction without residual deficits   THERAPY DIAG:  Cognitive communication deficit  Rationale for Evaluation and Treatment: Rehabilitation  SUBJECTIVE:   SUBJECTIVE STATEMENT: "Good. Went to eat at [employer] with a friend of mine."  PAIN:  Are you having pain? No  OBJECTIVE:   STANDARDIZED ASSESSMENTS: SLP initiated CLQT: Attention: Mild, Memory: Severe, Executive Function: Severe, Language: Moderate, Visuospatial Skills: Mild, and Clock Drawing: WNL Overall Severity: Moderate   TODAY'S TREATMENT:   05-30-22: Pt accompanied by spouse this session. Further CLQT eval completed this date. SLP observed pt difficulty with problem solving, organization, and planning during maze portion. Difficulty following directions during pattern creation task.   SLP introduced utilization of external aids/strategies (pill box, visual schedule) for medication management. Pt unable to recall any of his medications and required max A to add 8 medications to chart. Given name of medication, pt provided purpose of med in 1/6 opportunities. Demonstrates deviation in attention and working  memory during completion of task.   05-25-22: PT ambulates into ST office, with occasional demonstration of impaired balance. Pt denies headache, speech presents as WNL. SLP takes pt's BP, with initial reading of 161/91, subsequent reading after 10 minute latency period of 162/94. Pt reports did not take morning medications d/t not having time for breakfast. SLP advised pt will not be able to be seen for PT d/t raised BP and pt should routinely take medications at least 1 hr prior to therapy appointments. Pt and s/o verbalize agreement and undertanding. Pt agreeable to completion of PROM. SLP A in completion of Cognitive Function PROM with approx half completed. SLP elected to cease administration d/t pt selecting "0" or "none" for all items despite s/o providing examples of impairments evidenced. Instead asked for input from s/o of functional goals with the following ID: Med administration adherence Med sorting Orientation to daily tasks and schedule  Memory for intended actions or verbal expression Ability to navigate to and from places in community  Will plan to complete standardized cognitive assessment at next session when pt's BP is controled.   05-03-22: Education provided on evaluation results and SLP's recommendations. Initiated training re: orientation aids. Pt verbalizes agreement with POC, all questions answered to satisfaction.    PATIENT EDUCATION: Education details: see above Person educated: Patient and Spouse Education method: Explanation, Demonstration, and Verbal cues Education comprehension: verbalized understanding, returned demonstration, and needs further education   GOALS: Goals reviewed with patient? Yes  SHORT TERM GOALS: Target date: 05-31-22  Pt will complete CLQT and PROM initial therapy session Baseline: Goal status: INITIAL  2.  Pt will accurately sort medications with use of compensations PRN into pill box with occasional mod-A Baseline:  Goal status:  INITIAL  3.  Pt will generate visual aid to assist with increased independence in paying bills with A from SLP and s/o Baseline:  Goal status: INITIAL  4.  Pt will verbalize appropriate memory strategy given functional scenario with 80% accuracy   Baseline:  Goal status: INITIAL  5.  Pt and s/o will implement external A for orientation (e.g. calendar) with usual cues from care partner to initiate use  Baseline:  Goal status: INITIAL   LONG TERM GOALS: Target date: 07-26-22  With use of external aid, pt will answer orientation questions with 100% accuracy over 2 sessions Baseline:  Goal status: INITIAL  2.  Pt will verbalize x3 situations in which he successfully employed a memory strategy resulting in subjective satisfaction with ability to recall desired  information Baseline:  Goal status: INITIAL  3.  Pt will pay x3 of own bills with supervision from s/o, with use of compensations PRN Baseline:  Goal status: INITIAL  4.  Pt will report improvement via PROM by dc Baseline:  Goal status: INITIAL  ASSESSMENT:  CLINICAL IMPRESSION: Patient is a 77 y.o. M who was seen today for cognitive linguistic evaluation s/p stroke. Mr. Schellenberg presents with moderate cognitive impairment in areas of orientation, awareness, memory, executive functioning. Language marked as severe per CLQT, however pt and caregiver insistent that communication not impacted by stroke. Overall reduced verbal output and resistant to speak during evaluation in response to SLP questions/comments. Pt requiring assistance from significant other to manage medication, schedule, and finances. Prior to stroke pt was fully independent. Pt would benefit from skilled ST to facilitate increased independence, enhance QoL, and reduced caregiver burden.    OBJECTIVE IMPAIRMENTS: include attention, memory, awareness, executive functioning, and expressive language. These impairments are limiting patient from managing medications,  managing appointments, managing finances, household responsibilities, and ADLs/IADLs. Factors affecting potential to achieve goals and functional outcome are ability to learn/carryover information and cooperation/participation level.. Patient will benefit from skilled SLP services to address above impairments and improve overall function.  REHAB POTENTIAL: Fair (carryover and participation)  PLAN:  SLP FREQUENCY: 2x/week  SLP DURATION: 12 weeks  PLANNED INTERVENTIONS: Cueing hierachy, Cognitive reorganization, Internal/external aids, Functional tasks, SLP instruction and feedback, Compensatory strategies, and Patient/family education    Leroy Libman, [place] 05/30/2022, 12:40 PM

## 2022-05-30 NOTE — Therapy (Signed)
OUTPATIENT PHYSICAL THERAPY NEURO TREATMENT   Patient Name: Antonio Adams MRN: [id number] DOB:May 08, 1946, 77 y.o., male Today's Date: 05/30/2022   PCP: Scarlette Calico, MD REFERRING PROVIDER: Pricilla Holm, MD  END OF SESSION:  PT End of Session - 05/30/22 1112     Visit Number 2    Number of Visits 17    Date for PT Re-Evaluation 06/26/22    Authorization Type [employer] Medicare    PT Start Time 1107   pt late   PT Stop Time 1147   10 mins not billed for as time taken to sort HEP program issues and alternatives.   PT Time Calculation (min) 40 min    Activity Tolerance Patient tolerated treatment well    Behavior During Therapy WFL for tasks assessed/performed             Past Medical History:  Diagnosis Date   ADENOCARCINOMA, PROSTATE, GLEASON GRADE 6 12/21/2009   prostate cancer   ALLERGIC RHINITIS 03/16/2007   Allergy    Anxiety    Arthritis    back    BACK PAIN WITH RADICULOPATHY 11/23/2008   Cancer of kidney ([place])    partial right kidney removed   CARPAL TUNNEL SYNDROME, BILATERAL 03/16/2007   Chronic back pain    Constipation    takes Senokot daily   Depression    occasionally   DIABETES MELLITUS, TYPE II 03/16/2007   takes Januvia and MEtformin daily   GASTROINTESTINAL HEMORRHAGE, HX OF 03/16/2007   GERD 03/16/2007   takes Omeprazole daily   Glaucoma    mild - no eye drops   Hemorrhoids    History of colon polyps    HYPERLIPIDEMIA 03/16/2007   takes Crestor daily   HYPERTENSION 03/16/2007   takes Diltiazem and Lisinopril daily    Kidney cancer, primary, with metastasis from kidney to other site [facility])    LEG CRAMPS 05/22/2007   Nocturia    Overactive bladder    PARESTHESIA 05/21/2007   Proctitis    PTSD (post-traumatic stress disorder)    wakes up may be dreaming of fighting   Rectal bleeding    Dr.Norins has explained its from the Radiation that he has received   Rectal bleeding    Renal cell carcinoma ([place])    Sleep apnea    uses CPAP nightly    Stomach cancer ([place])    Urinary frequency    takes Toviaz daily   Past Surgical History:  Procedure Laterality Date   ADRENALECTOMY Right 02/26/2020   Procedure: OPEN RIGHT RENAL CELL METASTATECTOMY AND  ADRENALECTOMY;  Surgeon: [name], MD;  Location: [facility];  Service: Urology;  Laterality: Right;   [place], 2008   repeat surgery; ESI '08   [place]     bilateral   CERVICAL FUSION     COLONOSCOPY     colonosocpy     ESOPHAGOGASTRODUODENOSCOPY     ED   FLEXIBLE SIGMOIDOSCOPY N/A 01/08/2017   Procedure: FLEXIBLE SIGMOIDOSCOPY;  Surgeon: Irene Shipper, MD;  Location: [place];  Service: Endoscopy;  Laterality: N/A;   HOT HEMOSTASIS N/A 01/08/2017   Procedure: HOT HEMOSTASIS (ARGON PLASMA COAGULATION/BICAP);  Surgeon: Irene Shipper, MD;  [facility];  Service: Endoscopy;  Laterality: N/A;   IR RADIOLOGIST EVAL & MGMT  12/17/2019   IR RADIOLOGIST EVAL & MGMT  07/01/2020   IR RADIOLOGIST EVAL & MGMT  08/25/2020   LUMBAR LAMINECTOMY  10/30/2011   Procedure: MICRODISCECTOMY LUMBAR LAMINECTOMY;  Surgeon: Jessy Oto, MD;  Location: [place];  Service: Orthopedics;  Laterality: Right;  Right L4-5 and L5-S1 Microdiscectomy   LUMBAR LAMINECTOMY  06/17/2012   Procedure: MICRODISCECTOMY LUMBAR LAMINECTOMY;  Surgeon: Jessy Oto, MD;  Location: [place];  Service: Orthopedics;  Laterality: N/A;  Right L5-S1 microdiscectomy   OPEN PARTIAL HEPATECTOMY  N/A 02/26/2020   Procedure: OPEN PARTIAL HEPATECTOMY, CHOLECYSTECTOMY;  Surgeon: Stark Klein, MD;  Location: [facility];  Service: General;  Laterality: N/A;   POLYPECTOMY     PROSTATE SURGERY  march 2012   seed implant   RADIOFREQUENCY ABLATION N/A 07/21/2020   Procedure: CT MICROWAVE ABLATION;  Surgeon: Criselda Peaches, MD;  Location[facility];  Service: Anesthesiology;  Laterality: N/A;   ROBOTIC ASSITED PARTIAL NEPHRECTOMY Right 09/08/2015   Procedure: XI ROBOTIC ASSITED RIGHT PARTIAL NEPHRECTOMY;  Surgeon:  [name], MD;  [facility];  Service: Urology;  Laterality: Right;  Clamp on: 1033 Clamp off: 1103 Total Clamp Time: 30 minutes   SHOULDER ARTHROSCOPY WITH SUBACROMIAL DECOMPRESSION Left 01/20/2016   Procedure: LEFT SHOULDER ARTHROSCOPY WITH EXTENSIVE  DEBRIDEMENT, ACROMIOPLASTY;  Surgeon: Ninetta Lights, MD;  Location: [place];  Service: Orthopedics;  Laterality: Left;   SHOULDER SURGERY  LFT   Stress Cardiolite  12/24/2003   negative for ischemia   UPPER ESOPHAGEAL ENDOSCOPIC ULTRASOUND (EUS)  04/18/2016   [facility]   UPPER GASTROINTESTINAL ENDOSCOPY     WOUND EXPLORATION     management of a wound that he sustained in the [place]   Patient Active Problem List   Diagnosis Date Noted   Hx of completed stroke 04/28/2022   Abnormal brain CT 04/07/2022   Encounter for general adult medical examination with abnormal findings 01/23/2022   Anemia due to zinc deficiency 12/20/2021   Low back pain 12/15/2021   Symptomatic anemia 12/15/2021   Cancer related pain 12/15/2021   Chronic heart failure with preserved ejection fraction (HFpEF) ([place]) 08/12/2021   Tinea cruris 08/12/2021   Anemia due to chronic kidney disease 07/20/2021   CKD (chronic kidney disease) stage 5, GFR less than 15 ml/min (HCC) 05/24/2021   BPH (benign prostatic hyperplasia) 05/24/2021   Diabetic renal disease ([place]) 05/20/2021   Dry eye syndrome of bilateral lacrimal glands 05/20/2021   Senile nuclear sclerosis 05/20/2021   Vitamin D deficiency 05/20/2021   Vitreous degeneration, unspecified eye 05/20/2021   Intrinsic eczema 04/18/2021   Atherosclerosis of aorta ([place]) 12/30/2020   Mixed conductive and sensorineural hearing loss of left ear with restricted hearing of right ear 09/01/2020   Tinnitus of both ears 05/20/2020   Metastatic renal cell carcinoma ([place]) 02/26/2020   Diabetic polyneuropathy associated with type 2 diabetes mellitus ([place]) 12/18/2019   Murmur, cardiac 10/08/2018    DDD (degenerative disc disease), cervical 10/08/2018   OAB (overactive bladder) 04/04/2018   Seasonal allergic rhinitis due to pollen 11/01/2016   Microalbuminuria due to type 2 diabetes mellitus ([place]) 07/27/2016   Varicose veins of both lower extremities 08/06/2015   Varicose veins of lower extremities with ulcer ([place]) 06/16/2015   Internal hemorrhoid, bleeding 08/03/2014   Post traumatic stress disorder (PTSD) 01/29/2014   Spinal stenosis of lumbar region with radiculopathy 10/30/2011    Class: Chronic   Obstructive sleep apnea 11/24/2010   Obesity 11/24/2010   Routine general medical examination at a health care facility 11/24/2010   ADENOCARCINOMA, PROSTATE, GLEASON GRADE 6 12/21/2009   Type 2 diabetes mellitus with diabetic cataract ([place]) 03/16/2007  Essential hypertension 03/16/2007   GERD 03/16/2007    ONSET DATE: 04/07/22  REFERRING DIAG: Z86.73 (ICD-10-CM) - Hx of completed stroke  THERAPY DIAG:  Other abnormalities of gait and mobility  Muscle weakness (generalized)  Unsteadiness on feet  Other lack of coordination  Rationale for Evaluation and Treatment: Rehabilitation  SUBJECTIVE:                                                                                                                                                                                             SUBJECTIVE STATEMENT: Pt states he feels a little nauseous today, but has taken some medicine for this.  He took his BP meds within the hour and has also started Cymbalta this morning.  Pt states he is tired this morning.  Fiance and pt endorse he is physically the same as eval and both deny pt has had any falls since that time. Pt accompanied by: significant other [place])  PERTINENT HISTORY: COHAN STIPES is a 77 y.o. R-handed male with history of DM2 with diabetic polyneuropathy, OSA on nightly CPAP, PTSD, history of lumbar stenosis with chronic back pain, metastatic renal carcinoma with  lymphadenopathy and questionable hepatic involvement noted in 2022, not on chemo due to poor tolerance to the medications, and without significant progression since May 2023, only on surveillance imaging, stage IV, eczema, vitamin D deficiency, CKD stage V refusing HD, BPH, anemia of CKD  Zinc deficiency, chronic heart failure, mild cognitive impairment, presenting for evaluation of left PCA territory acute CVA.  PAIN:  Are you having pain? No  PRECAUTIONS: Fall  WEIGHT BEARING RESTRICTIONS: No  OBJECTIVE:  TODAY'S TREATMENT:                                                                                                                              Today's Vitals   05/30/22 1110 05/30/22 1117  BP: (!) 169/98 (!) 156/89  Pulse: 75    -Supine bridges x12 -STS w/ BUE support and green theraband at thighs x10 -Standing march at counter x20 -Lateral stepping w/ green  band at thighs 4x10' -Standing glute kickbacks x10 each LE, inc cuing and demo for correct form  PATIENT EDUCATION: Education details: Monitoring BP at home and s/s requiring emergent attention.  Initial HEP. Person educated: Patient and Spouse Education method: Explanation, Demonstration, and Verbal cues Education comprehension: verbalized understanding and needs further education  HOME EXERCISE PROGRAM:   Clinical References  HIP: Extension, Bridging Bilateral    Place feet on surface. Tighten glutes, raise hips up. 12 reps per set, 1 sets per day, 4-5 days per week   Copyright  VHI. All rights reserved.    HIP: Extension / KNEE: Flexion, Standing    Bend knee, squeeze glutes. Move leg backward. 10 reps per set, 1 sets per day, 4-5 days per week Hold onto a support.  Copyright  VHI. All rights reserved.    Band Walk: Side Stepping    Tie band around legs, just above knees. Step 10 feet to one side, then step back to start. Repeat 40 feet per session. Note: Small towel between band and skin eases  rubbing.   http://plyo.exer.us/76   Copyright  VHI. All rights reserved.    Marching In-Place    Standing straight, alternate bringing knees toward trunk. Arms swing alternately. Do 2 sets. Do 20 times per day.  Copyright  VHI. All rights reserved.    Sit to Stand / Stand to Sit / Transfers    Sit on edge of a solid chair with arms, feet flat on floor. Lean forward over feet and stand up with hands on chair arms. Sit down slowly with hands on chair arms. Repeat 10 times per session. Do 1-2 sessions per day.  Copyright  VHI. All rights reserved.     GOALS: Goals reviewed with patient? Yes  SHORT TERM GOALS: Target date: 05/26/22  Pt will be IND with initial HEP in order to indicate improved functional mobility and dec fall risk. Baseline:   Goal status: INITIAL  2.  Pt will improve gait speed to >/=2.62 ft/sec w/ SPC in order to indicate dec fall risk.  Baseline: 2.00 ft/sec  Goal status: INITIAL  3.  Pt will ambulate 59' without at mod I level with only minimal Trendelenburg in order to indicate more independent household ambulation.   Baseline:  Goal status: INITIAL  4.  Pt will improve FGA to >/=18/30 in order to indicate dec fall risk.   Baseline: 12/30 Goal status: INITIAL  5.  Pt will improve 5TSS to </=30 secs without UE support only in order to indicate dec fall risk and improved functional strength.   Baseline: approx 37 secs with hands on lap and mild anterior LOB Goal status: INITIAL  6.  Pt will negotiate up/down 4 steps with single rail/cane, up/down ramp and curb and ambulate x 500' outdoors over unlevel paved surfaces at S level in order to indicate improved community mobility.   Baseline:  Goal status: INITIAL  LONG TERM GOALS: Target date: 06/26/22  Pt will be IND with final HEP in order to indicate improved functional mobility and dec fall risk. Baseline:  Goal status: INITIAL  2.  Pt will improve FGA to >/=23/30 in order to indicate  dec fall risk.   Baseline:  Goal status: INITIAL  3.  Pt will improve 5TSS to </=20 secs without UE support only in order to indicate dec fall risk and improved functional strength.   Baseline:  Goal status: INITIAL  4.  Pt will improve gait speed to >/=2.62 ft/sec w/o  AD in order to indicate dec fall risk.  Baseline:  Goal status: INITIAL  5.  Pt will ambulate 230' without AD at mod I level in order to indicate more independent household ambulation.   Baseline:  Goal status: INITIAL  6.  Pt will negotiate up/down 4 steps with single rail, up/down ramp and curb and ambulate x 500' outdoors over unlevel paved surfaces without device at S level in order to indicate improved community mobility.   Baseline:  Goal status: INITIAL  ASSESSMENT:  CLINICAL IMPRESSION: Entirety of session focused on established hip directed strengthening program as pt has not been in clinic since evaluation.  He presents with no new apparent deficits.  He is due for STG assessment which will be done at next visit to further assess any functional change that has occurred in the absence of skilled therapy.   OBJECTIVE IMPAIRMENTS: Abnormal gait, decreased activity tolerance, decreased balance, decreased endurance, decreased mobility, decreased strength, impaired perceived functional ability, and postural dysfunction.   ACTIVITY LIMITATIONS: carrying, lifting, bending, standing, squatting, stairs, transfers, and locomotion level  PARTICIPATION LIMITATIONS: meal prep, cleaning, medication management, personal finances, driving, and community activity  PERSONAL FACTORS: 3+ comorbidities: see above  are also affecting patient's functional outcome.   REHAB POTENTIAL: Good  CLINICAL DECISION MAKING: Evolving/moderate complexity  EVALUATION COMPLEXITY: Moderate  PLAN:  PT FREQUENCY: 2x/week  PT DURATION: 8 weeks  PLANNED INTERVENTIONS: Therapeutic exercises, Therapeutic activity, Neuromuscular  re-education, Balance training, Gait training, Patient/Family education, Self Care, Stair training, Vestibular training, DME instructions, and Aquatic Therapy  PLAN FOR NEXT SESSION: ASSESS STGs (unable to last session due to establishing HEP/[employer] issues)!  Add to HEP for LE strengthening (hips) and also balance (counter top/corner), gait with and without cane, stair negotiation.   Elease Etienne, PT, [place] [address] [place], [place], [address] Phone: [phone]   Fax:  [phone] 05/30/22, 11:57 AM

## 2022-05-30 NOTE — Patient Instructions (Signed)
VISUAL SCANNING STRATEGIES  1. Look for the edge of objects (to the left and/or right) so that you make sure you are seeing all of an object  2. Turn your head when walking, scan from side to side, particularly in busy environments  3. Use an organized scanning pattern. It's usually easier to scan from top to bottom, and left to right (like you are reading)  4. Double check yourself  5. Use a line guide (like a blank piece of paper) or your finger when reading  6. If necessary, place brightly colored tape at end of table or work area as a reminder to always look until you see the tape.   Activities to try at home to encourage visual scanning:   1. Word searches 2. Mazes 3. Puzzles 4. Card games 5. Computer games and/or searches 6. Connect-the-dots  Activities for environmental (larger) scanning:  1. With supervision, scan for items in grocery store or drugstore.  Begin with a familiar store, then progress to a new store you've never been in before. Make sure you have supervision with this.   2. With supervision, tell a family member or caregiver when it is safe to cross a street after looking all directions and any side streets. However, do NOT cross street unless family member or caregiver is with you and says it is OK   Coordination Activities  Perform the following activities for 10-15 minutes 1 times per day with right hand(s).  Rotate ball in fingertips (clockwise and counter-clockwise). Toss ball in air and catch with the same hand. Flip cards 1 at a time as fast as you can. Deal cards with your thumb (Hold deck in hand and push card off top with thumb). Rotate ONE card in hand (clockwise and counter-clockwise). Pick up coins and place in container or coin bank. Pick up coins one at a time until you get 5 in your hand, then move coins from palm to fingertips to stack one at a time. Practice writing

## 2022-05-30 NOTE — Therapy (Signed)
OUTPATIENT OCCUPATIONAL THERAPY NEURO TREATMENT  Patient Name: Antonio Adams MRN: [id number] DOB:Jul 17, 1945, 77 y.o., male Today's Date: 05/30/2022  PCP: Dr. Sharlet Salina REFERRING PROVIDER: Dr. Sharlet Salina  END OF SESSION:  OT End of Session - 05/30/22 1233     Visit Number 2    Number of Visits 25    Date for OT Re-Evaluation 07/26/22    Authorization Type [employer] Medicare    Progress Note Due on Visit 10    OT Start Time 1230    OT Stop Time 1315    OT Time Calculation (min) 45 min    Activity Tolerance Patient tolerated treatment well    Behavior During Therapy WFL for tasks assessed/performed             Past Medical History:  Diagnosis Date   ADENOCARCINOMA, PROSTATE, GLEASON GRADE 6 12/21/2009   prostate cancer   ALLERGIC RHINITIS 03/16/2007   Allergy    Anxiety    Arthritis    back    BACK PAIN WITH RADICULOPATHY 11/23/2008   Cancer of kidney ([place])    partial right kidney removed   CARPAL TUNNEL SYNDROME, BILATERAL 03/16/2007   Chronic back pain    Constipation    takes Senokot daily   Depression    occasionally   DIABETES MELLITUS, TYPE II 03/16/2007   takes Januvia and MEtformin daily   GASTROINTESTINAL HEMORRHAGE, HX OF 03/16/2007   GERD 03/16/2007   takes Omeprazole daily   Glaucoma    mild - no eye drops   Hemorrhoids    History of colon polyps    HYPERLIPIDEMIA 03/16/2007   takes Crestor daily   HYPERTENSION 03/16/2007   takes Diltiazem and Lisinopril daily    Kidney cancer, primary, with metastasis from kidney to other site [facility])    LEG CRAMPS 05/22/2007   Nocturia    Overactive bladder    PARESTHESIA 05/21/2007   Proctitis    PTSD (post-traumatic stress disorder)    wakes up may be dreaming of fighting   Rectal bleeding    Dr.Norins has explained its from the Radiation that he has received   Rectal bleeding    Renal cell carcinoma ([place])    Sleep apnea    uses CPAP nightly   Stomach cancer ([place])    Urinary frequency    takes Toviaz daily    Past Surgical History:  Procedure Laterality Date   ADRENALECTOMY Right 02/26/2020   Procedure: OPEN RIGHT RENAL CELL METASTATECTOMY AND  ADRENALECTOMY;  Surgeon: [name], MD;  Location: [facility];  Service: Urology;  Laterality: Right;   [place], 2008   repeat surgery; ESI '08   [place]     bilateral   CERVICAL FUSION     COLONOSCOPY     colonosocpy     ESOPHAGOGASTRODUODENOSCOPY     ED   FLEXIBLE SIGMOIDOSCOPY N/A 01/08/2017   Procedure: FLEXIBLE SIGMOIDOSCOPY;  Surgeon: Irene Shipper, MD;  Location: [place];  Service: Endoscopy;  Laterality: N/A;   HOT HEMOSTASIS N/A 01/08/2017   Procedure: HOT HEMOSTASIS (ARGON PLASMA COAGULATION/BICAP);  Surgeon: Irene Shipper, MD;  Location: [facility]COPY;  Service: Endoscopy;  Laterality: N/A;   IR RADIOLOGIST EVAL & MGMT  12/17/2019   IR RADIOLOGIST EVAL & MGMT  07/01/2020   IR RADIOLOGIST EVAL & MGMT  08/25/2020   LUMBAR LAMINECTOMY  10/30/2011   Procedure: MICRODISCECTOMY LUMBAR LAMINECTOMY;  Surgeon: Jessy Oto, MD;  Location: [place];  Service: Orthopedics;  Laterality: Right;  Right L4-5 and L5-S1 Microdiscectomy   LUMBAR LAMINECTOMY  06/17/2012   Procedure: MICRODISCECTOMY LUMBAR LAMINECTOMY;  Surgeon: Jessy Oto, MD;  Location: [place];  Service: Orthopedics;  Laterality: N/A;  Right L5-S1 microdiscectomy   OPEN PARTIAL HEPATECTOMY  N/A 02/26/2020   Procedure: OPEN PARTIAL HEPATECTOMY, CHOLECYSTECTOMY;  Surgeon: Stark Klein, MD;  Location: [facility];  Service: General;  Laterality: N/A;   POLYPECTOMY     PROSTATE SURGERY  march 2012   seed implant   RADIOFREQUENCY ABLATION N/A 07/21/2020   Procedure: CT MICROWAVE ABLATION;  Surgeon: Criselda Peaches, MD;  Location: [facility];  Service: Anesthesiology;  Laterality: N/A;   ROBOTIC ASSITED PARTIAL NEPHRECTOMY Right 09/08/2015   Procedure: XI ROBOTIC ASSITED RIGHT PARTIAL NEPHRECTOMY;  Surgeon: [name], MD;  Location: [facility];  Service: Urology;   Laterality: Right;  Clamp on: 1033 Clamp off: 1103 Total Clamp Time: 30 minutes   SHOULDER ARTHROSCOPY WITH SUBACROMIAL DECOMPRESSION Left 01/20/2016   Procedure: LEFT SHOULDER ARTHROSCOPY WITH EXTENSIVE  DEBRIDEMENT, ACROMIOPLASTY;  Surgeon: Ninetta Lights, MD;  Location: [place];  Service: Orthopedics;  Laterality: Left;   SHOULDER SURGERY  LFT   Stress Cardiolite  12/24/2003   negative for ischemia   UPPER ESOPHAGEAL ENDOSCOPIC ULTRASOUND (EUS)  04/18/2016   [facility]   UPPER GASTROINTESTINAL ENDOSCOPY     WOUND EXPLORATION     management of a wound that he sustained in the [place]   Patient Active Problem List   Diagnosis Date Noted   Hx of completed stroke 04/28/2022   Abnormal brain CT 04/07/2022   Encounter for general adult medical examination with abnormal findings 01/23/2022   Anemia due to zinc deficiency 12/20/2021   Low back pain 12/15/2021   Symptomatic anemia 12/15/2021   Cancer related pain 12/15/2021   Chronic heart failure with preserved ejection fraction (HFpEF) ([place]) 08/12/2021   Tinea cruris 08/12/2021   Anemia due to chronic kidney disease 07/20/2021   CKD (chronic kidney disease) stage 5, GFR less than 15 ml/min (HCC) 05/24/2021   BPH (benign prostatic hyperplasia) 05/24/2021   Diabetic renal disease ([place]) 05/20/2021   Dry eye syndrome of bilateral lacrimal glands 05/20/2021   Senile nuclear sclerosis 05/20/2021   Vitamin D deficiency 05/20/2021   Vitreous degeneration, unspecified eye 05/20/2021   Intrinsic eczema 04/18/2021   Atherosclerosis of aorta ([place]) 12/30/2020   Mixed conductive and sensorineural hearing loss of left ear with restricted hearing of right ear 09/01/2020   Tinnitus of both ears 05/20/2020   Metastatic renal cell carcinoma ([place]) 02/26/2020   Diabetic polyneuropathy associated with type 2 diabetes mellitus ([place]) 12/18/2019   Murmur, cardiac 10/08/2018   DDD (degenerative disc disease), cervical 10/08/2018   OAB  (overactive bladder) 04/04/2018   Seasonal allergic rhinitis due to pollen 11/01/2016   Microalbuminuria due to type 2 diabetes mellitus ([place]) 07/27/2016   Varicose veins of both lower extremities 08/06/2015   Varicose veins of lower extremities with ulcer ([place]) 06/16/2015   Internal hemorrhoid, bleeding 08/03/2014   Post traumatic stress disorder (PTSD) 01/29/2014   Spinal stenosis of lumbar region with radiculopathy 10/30/2011    Class: Chronic   Obstructive sleep apnea 11/24/2010   Obesity 11/24/2010   Routine general medical examination at a health care facility 11/24/2010   ADENOCARCINOMA, PROSTATE, GLEASON GRADE 6 12/21/2009   Type 2 diabetes mellitus with diabetic cataract ([place]) 03/16/2007   Essential hypertension 03/16/2007   GERD 03/16/2007    ONSET DATE: 04/07/22  REFERRING DIAG: Diagnosis Z86.73 (ICD-10-CM) - Personal history of transient ischemic attack (TIA), and cerebral infarction without residual deficits  THERAPY DIAG:  Visuospatial deficit  Muscle weakness (generalized)  Other lack of coordination  Unsteadiness on feet  Rationale for Evaluation and Treatment: Rehabilitation  SUBJECTIVE:   SUBJECTIVE STATEMENT: No recent falls Pt accompanied by: significant other fiancee [name]  PERTINENT HISTORY: 77 y.o. R-handed male with history of DM2 with diabetic polyneuropathy, OSA on nightly CPAP, PTSD, history of lumbar stenosis with chronic back pain, metastatic renal carcinoma with lymphadenopathy and questionable hepatic involvement noted in 2022, not on chemo due to poor tolerance to the medications, and without significant progression since May 2023, only on surveillance imaging, stage IV, eczema, vitamin D deficiency, CKD stage V refusing HD, BPH, anemia of CKD  Zinc deficiency, chronic heart failure, mild cognitive impairment, s/p left PCA territory acute CVA on 04/07/22   DIAGNOSTIC FINDINGS:  MRI of the brain showing a left PCA large infarct and punctate  right PCA infarct MRA remarkable for left PCA P3 branch occlusion, ICA siphon atherosclerosis with possible tandem significant stenosis of the right siphon  PRECAUTIONS: Fall, hx of CA  WEIGHT BEARING RESTRICTIONS: No  PAIN:  Are you having pain? Yes 6/10 lumbar spine which is chronic - O.T. not addressing d/t chronic in nature  FALLS: Has patient fallen in last 6 months? Yes. Number of falls 1  LIVING ENVIRONMENT: Lives with: lives alone, fiancee is there most of the time Lives in: House/apartment Stairs: 1 level stairs to enter Has following equipment at home: Single point cane  PLOF: Independent  PATIENT GOALS: to get right  OBJECTIVE:   HAND DOMINANCE: Right  ADLs: Overall ADLs: fall risk in standing Transfers/ambulation related to [place] Eating: mod I Grooming: mod I UB Dressing: mod I  LB Dressing: supervision, increased time required, therapist recommends pt sits for LB dressing due to fall risk Toileting: mod I using cane Bathing: supervision Tub Shower transfers: min A currently standing in shower, no grab bars, using tub shower does not have seat Equipment: none  IADLs: Shopping: has assistance Light housekeeping: fiancee handles Meal Prep: does not cook Community mobility: ambulates with cane, supervision-minguard, unsteady and walks close to items Medication management: fiancee is helping with meds Financial management: fiancee is assisting  Handwriting: 100% legible  MOBILITY STATUS:  ambulates with minguard with cane , some unsteadiness and walks close to walls and doorways     FUNCTIONAL OUTCOME MEASURES: FOTO: TBA  UPPER EXTREMITY ROM:  remaining AROM is WFLS   Active ROM Right eval Left eval  Shoulder flexion 105 105  Shoulder abduction    Shoulder adduction    Shoulder extension    Shoulder internal rotation    Shoulder external rotation    Elbow flexion    Elbow extension -15 -20  Wrist flexion    Wrist extension     Wrist ulnar deviation    Wrist radial deviation    Wrist pronation    Wrist supination    (Blank rows = not tested)  UPPER EXTREMITY MMT:     MMT Right eval Left eval  Shoulder flexion 4-/5 3+/5  Shoulder abduction    Shoulder adduction    Shoulder extension    Shoulder internal rotation    Shoulder external rotation    Middle trapezius    Lower trapezius    Elbow flexion 4+/5 4+/5  Elbow extension 4/5 4/5  Wrist flexion    Wrist extension  Wrist ulnar deviation    Wrist radial deviation    Wrist pronation    Wrist supination    (Blank rows = not tested)  HAND FUNCTION: Grip strength: Right: 46.9 lbs; Left: 40.3 lbs  COORDINATION: 9 Hole Peg test: Right: 44 sec; Left: 40 sec   SENSATION: Light touch: Impaired  for ring and small finger R hand ulnar distribution, reports this was since CA treatment  EDEMA: n/a    COGNITION: Overall cognitive status: Impaired Decreased short term memory, delayed processing and decreased awareness  VISION:  Baseline vision: Wears glasses all the time   VISION ASSESSMENT: Number cancellation task 1.5 M : 7/80 missed, 91% Visual Fields: per hospital records R homonymous hemianopsia  Vision to be further assessed in a functional context  Patient has difficulty with following activities due to following visual impairments: ambulates close to walls and doorways on R side    OBSERVATIONS: Pt with minimal verbalizations and poor awareness of deficits   TODAY'S TREATMENT:                                                                                                                               Further assessed visual fields - pt appears to have significant Rt visual field cut. Pt issued visual scanning strategies and discussed w/ pt/wife.   Pt issued coordination HEP for Rt hand - see pt instructions for details. Pt demo each as indicated   PATIENT EDUCATION: Education details: visual scanning strategies,  coordination HEP  Person educated: Patient and Spouse Education method: Explanation, Demonstration, Verbal cues, and Handouts Education comprehension: verbalized understanding, returned demonstration, verbal cues required, and needs further education   HOME EXERCISE PROGRAM: 05/30/22: Visual scanning strategies, coordination HEP    GOALS: Goals reviewed with patient? Yes  SHORT TERM GOALS: Target date: 06/02/22  I with initial HEP : Goal status: IN PROGRESS  2.  Pt will perform tub transfers safely mod I using tub bench prn. Baseline: doe not have tub bench yet, currently supervision, min A Goal status: INITIAL  3.  Pt/caregiver will verbalize understanding of compensations for visual deficits  Goal status: INITIAL  4.  Pt will demonstrate improved fine motor coordination  for ADLs with RUE as evidenced by decreasing 9 hole peg test score by 3 secs Baseline: 9 Hole Peg test: Right: 44 sec; Left: 40 sec Goal status: INITIAL  5.   Pt will navigate a busy environment and locate items with 75% accuracy without bumping into items. Baseline: nearly bumps into wall and doorway on right side Goal status: INITIAL  6.  Pt will verbalize understanding of compensations for short term memory deficits. Baseline:  Goal status: INITIAL  LONG TERM GOALS: Target date: 07/25/21  I with updated HEP Baseline:  Goal status: INITIAL  2.  Pt will navigate a busy environment and locate items with 90% accuracy. Baseline: nearly bumps into wall and doorway on right side Goal status: INITIAL  3.  Pt will perform all basic ADLs mod I with good safety awareness. Baseline: supervision, min A Goal status: INITIAL  4.  Pt will demonstrate improved fine motor coordination with RUE  for ADLs as evidenced by decreasing 9 hole peg test score by 6 secs form initial eval. Baseline: 9 Hole Peg test: Right: 44 sec; Left: 40 sec Goal status: INITIAL  5.  Pt will demonstrate ability to retrieve  lightweight object at 110* shoulder flexion and -10 elbow extension with RUE Baseline: shoulder flexion 105, elbow extension -20 Goal status: INITIAL  6.  Pt will demonstrate ability to retrieve lightweight object at 110* shoulder flexion and -15 elbow extension with LUE Baseline: shoulder flexion 105, elbow extension -20 Goal status: inital  ASSESSMENT:  CLINICAL IMPRESSION: Pt w/ significant visual field loss to Rt side. Pt progressing with HEP for coordination  PERFORMANCE DEFICITS: in functional skills including ADLs, IADLs, coordination, dexterity, proprioception, sensation, tone, ROM, strength, flexibility, Fine motor control, Gross motor control, mobility, balance, endurance, decreased knowledge of precautions, decreased knowledge of use of DME, vision, and UE functional use, cognitive skills including energy/drive, learn, memory, problem solving, safety awareness, thought, and understand, and psychosocial skills including coping strategies, environmental adaptation, habits, interpersonal interactions, and routines and behaviors.   IMPAIRMENTS: are limiting patient from ADLs, IADLs, rest and sleep, play, leisure, and social participation.   CO-MORBIDITIES: may have co-morbidities  that affects occupational performance. Patient will benefit from skilled OT to address above impairments and improve overall function.  MODIFICATION OR ASSISTANCE TO COMPLETE EVALUATION: No modification of tasks or assist necessary to complete an evaluation.  OT OCCUPATIONAL PROFILE AND HISTORY: Detailed assessment: Review of records and additional review of physical, cognitive, psychosocial history related to current functional performance.  CLINICAL DECISION MAKING: LOW - limited treatment options, no task modification necessary  REHAB POTENTIAL: Good  EVALUATION COMPLEXITY: Low    PLAN:  OT FREQUENCY: 2x/week  OT DURATION: 12 weeks  PLANNED INTERVENTIONS: self care/ADL training, therapeutic  exercise, therapeutic activity, neuromuscular re-education, manual therapy, passive range of motion, gait training, balance training, stair training, functional mobility training, aquatic therapy, splinting, paraffin, fluidotherapy, moist heat, cryotherapy, contrast bath, patient/family education, cognitive remediation/compensation, visual/perceptual remediation/compensation, energy conservation, coping strategies training, DME and/or AE instructions, and Re-evaluation  RECOMMENDED OTHER SERVICES: PT, ST  CONSULTED AND AGREED WITH PLAN OF CARE: Patient and family member/caregiver  PLAN FOR NEXT SESSION: cane exercises supine, review coordination HEP    Hans Eden, OT 05/30/2022, 12:34 PM

## 2022-06-01 ENCOUNTER — Ambulatory Visit: Payer: Medicare Other | Admitting: Physical Therapy

## 2022-06-01 ENCOUNTER — Ambulatory Visit: Payer: Medicare Other | Admitting: Occupational Therapy

## 2022-06-01 ENCOUNTER — Encounter: Payer: Self-pay | Admitting: Physical Therapy

## 2022-06-01 VITALS — BP 133/81 | HR 69

## 2022-06-01 DIAGNOSIS — R2681 Unsteadiness on feet: Secondary | ICD-10-CM

## 2022-06-01 DIAGNOSIS — M6281 Muscle weakness (generalized): Secondary | ICD-10-CM | POA: Diagnosis not present

## 2022-06-01 DIAGNOSIS — R41842 Visuospatial deficit: Secondary | ICD-10-CM

## 2022-06-01 DIAGNOSIS — R278 Other lack of coordination: Secondary | ICD-10-CM

## 2022-06-01 DIAGNOSIS — R2689 Other abnormalities of gait and mobility: Secondary | ICD-10-CM

## 2022-06-01 NOTE — Therapy (Signed)
OUTPATIENT PHYSICAL THERAPY NEURO TREATMENT   Patient Name: Antonio Adams MRN: [id number] DOB:Jan 25, 1946, 77 y.o., male Today's Date: 06/01/2022   PCP: Scarlette Calico, MD REFERRING PROVIDER: Pricilla Holm, MD  END OF SESSION:  PT End of Session - 06/01/22 1019     Visit Number 3    Number of Visits 17    Date for PT Re-Evaluation 06/26/22    Authorization Type [employer] Medicare    PT Start Time 6269   Handoff from OT   PT Stop Time 1102    PT Time Calculation (min) 44 min    Equipment Utilized During Treatment Gait belt    Activity Tolerance Patient tolerated treatment well    Behavior During Therapy WFL for tasks assessed/performed             Past Medical History:  Diagnosis Date   ADENOCARCINOMA, PROSTATE, GLEASON GRADE 6 12/21/2009   prostate cancer   ALLERGIC RHINITIS 03/16/2007   Allergy    Anxiety    Arthritis    back    BACK PAIN WITH RADICULOPATHY 11/23/2008   Cancer of kidney ([place])    partial right kidney removed   CARPAL TUNNEL SYNDROME, BILATERAL 03/16/2007   Chronic back pain    Constipation    takes Senokot daily   Depression    occasionally   DIABETES MELLITUS, TYPE II 03/16/2007   takes Januvia and MEtformin daily   GASTROINTESTINAL HEMORRHAGE, HX OF 03/16/2007   GERD 03/16/2007   takes Omeprazole daily   Glaucoma    mild - no eye drops   Hemorrhoids    History of colon polyps    HYPERLIPIDEMIA 03/16/2007   takes Crestor daily   HYPERTENSION 03/16/2007   takes Diltiazem and Lisinopril daily    Kidney cancer, primary, with metastasis from kidney to other site [facility])    LEG CRAMPS 05/22/2007   Nocturia    Overactive bladder    PARESTHESIA 05/21/2007   Proctitis    PTSD (post-traumatic stress disorder)    wakes up may be dreaming of fighting   Rectal bleeding    Dr.Norins has explained its from the Radiation that he has received   Rectal bleeding    Renal cell carcinoma ([place])    Sleep apnea    uses CPAP nightly   Stomach cancer ([place])     Urinary frequency    takes Toviaz daily   Past Surgical History:  Procedure Laterality Date   ADRENALECTOMY Right 02/26/2020   Procedure: OPEN RIGHT RENAL CELL METASTATECTOMY AND  ADRENALECTOMY;  Surgeon: [name], MD;  Location: [facility];  Service: Urology;  Laterality: Right;   [place], 2008   repeat surgery; ESI '08   [place]     bilateral   CERVICAL FUSION     COLONOSCOPY     colonosocpy     ESOPHAGOGASTRODUODENOSCOPY     ED   FLEXIBLE SIGMOIDOSCOPY N/A 01/08/2017   Procedure: FLEXIBLE SIGMOIDOSCOPY;  Surgeon: Irene Shipper, MD;  Location: [place];  Service: Endoscopy;  Laterality: N/A;   HOT HEMOSTASIS N/A 01/08/2017   Procedure: HOT HEMOSTASIS (ARGON PLASMA COAGULATION/BICAP);  Surgeon: Irene Shipper, MD;  Location: [facility];  Service: Endoscopy;  Laterality: N/A;   IR RADIOLOGIST EVAL & MGMT  12/17/2019   IR RADIOLOGIST EVAL & MGMT  07/01/2020   IR RADIOLOGIST EVAL & MGMT  08/25/2020   LUMBAR LAMINECTOMY  10/30/2011   Procedure: MICRODISCECTOMY LUMBAR LAMINECTOMY;  Surgeon: Jessy Oto, MD;  Location: [place];  Service: Orthopedics;  Laterality: Right;  Right L4-5 and L5-S1 Microdiscectomy   LUMBAR LAMINECTOMY  06/17/2012   Procedure: MICRODISCECTOMY LUMBAR LAMINECTOMY;  Surgeon: Jessy Oto, MD;  Location: [place];  Service: Orthopedics;  Laterality: N/A;  Right L5-S1 microdiscectomy   OPEN PARTIAL HEPATECTOMY  N/A 02/26/2020   Procedure: OPEN PARTIAL HEPATECTOMY, CHOLECYSTECTOMY;  Surgeon: Stark Klein, MD;  Location[facility];  Service: General;  Laterality: N/A;   POLYPECTOMY     PROSTATE SURGERY  march 2012   seed implant   RADIOFREQUENCY ABLATION N/A 07/21/2020   Procedure: CT MICROWAVE ABLATION;  Surgeon: Criselda Peaches, MD;  Location[facility];  Service: Anesthesiology;  Laterality: N/A;   ROBOTIC ASSITED PARTIAL NEPHRECTOMY Right 09/08/2015   Procedure: XI ROBOTIC ASSITED RIGHT PARTIAL NEPHRECTOMY;  Surgeon: [name], MD;   Location[facility];  Service: Urology;  Laterality: Right;  Clamp on: 1033 Clamp off: 1103 Total Clamp Time: 30 minutes   SHOULDER ARTHROSCOPY WITH SUBACROMIAL DECOMPRESSION Left 01/20/2016   Procedure: LEFT SHOULDER ARTHROSCOPY WITH EXTENSIVE  DEBRIDEMENT, ACROMIOPLASTY;  Surgeon: Ninetta Lights, MD;  Location: [place];  Service: Orthopedics;  Laterality: Left;   SHOULDER SURGERY  LFT   Stress Cardiolite  12/24/2003   negative for ischemia   UPPER ESOPHAGEAL ENDOSCOPIC ULTRASOUND (EUS)  04/18/2016   [facility]   UPPER GASTROINTESTINAL ENDOSCOPY     WOUND EXPLORATION     management of a wound that he sustained in the [place]   Patient Active Problem List   Diagnosis Date Noted   Hx of completed stroke 04/28/2022   Abnormal brain CT 04/07/2022   Encounter for general adult medical examination with abnormal findings 01/23/2022   Anemia due to zinc deficiency 12/20/2021   Low back pain 12/15/2021   Symptomatic anemia 12/15/2021   Cancer related pain 12/15/2021   Chronic heart failure with preserved ejection fraction (HFpEF) ([place]) 08/12/2021   Tinea cruris 08/12/2021   Anemia due to chronic kidney disease 07/20/2021   CKD (chronic kidney disease) stage 5, GFR less than 15 ml/min (HCC) 05/24/2021   BPH (benign prostatic hyperplasia) 05/24/2021   Diabetic renal disease ([place]) 05/20/2021   Dry eye syndrome of bilateral lacrimal glands 05/20/2021   Senile nuclear sclerosis 05/20/2021   Vitamin D deficiency 05/20/2021   Vitreous degeneration, unspecified eye 05/20/2021   Intrinsic eczema 04/18/2021   Atherosclerosis of aorta ([place]) 12/30/2020   Mixed conductive and sensorineural hearing loss of left ear with restricted hearing of right ear 09/01/2020   Tinnitus of both ears 05/20/2020   Metastatic renal cell carcinoma ([place]) 02/26/2020   Diabetic polyneuropathy associated with type 2 diabetes mellitus ([place]) 12/18/2019   Murmur, cardiac 10/08/2018   DDD (degenerative disc  disease), cervical 10/08/2018   OAB (overactive bladder) 04/04/2018   Seasonal allergic rhinitis due to pollen 11/01/2016   Microalbuminuria due to type 2 diabetes mellitus ([place]) 07/27/2016   Varicose veins of both lower extremities 08/06/2015   Varicose veins of lower extremities with ulcer ([place]) 06/16/2015   Internal hemorrhoid, bleeding 08/03/2014   Post traumatic stress disorder (PTSD) 01/29/2014   Spinal stenosis of lumbar region with radiculopathy 10/30/2011    Class: Chronic   Obstructive sleep apnea 11/24/2010   Obesity 11/24/2010   Routine general medical examination at a health care facility 11/24/2010   ADENOCARCINOMA, PROSTATE, GLEASON GRADE 6 12/21/2009   Type 2 diabetes mellitus with diabetic cataract ([place]) 03/16/2007   Essential hypertension 03/16/2007   GERD  03/16/2007    ONSET DATE: 04/07/22  REFERRING DIAG: Z66.06 (ICD-10-CM) - Hx of completed stroke  THERAPY DIAG:  Muscle weakness (generalized)  Other lack of coordination  Unsteadiness on feet  Other abnormalities of gait and mobility  Rationale for Evaluation and Treatment: Rehabilitation  SUBJECTIVE:                                                                                                                                                                                             SUBJECTIVE STATEMENT: Pt states his nausea resolved quickly after last appt and has not returned.  He has taken all of his meds this morning.  He denies falls/near falls or pain. Pt accompanied by: significant other [place])  PERTINENT HISTORY: MCCOY TESTA is a 77 y.o. R-handed male with history of DM2 with diabetic polyneuropathy, OSA on nightly CPAP, PTSD, history of lumbar stenosis with chronic back pain, metastatic renal carcinoma with lymphadenopathy and questionable hepatic involvement noted in 2022, not on chemo due to poor tolerance to the medications, and without significant progression since May 2023, only on  surveillance imaging, stage IV, eczema, vitamin D deficiency, CKD stage V refusing HD, BPH, anemia of CKD  Zinc deficiency, chronic heart failure, mild cognitive impairment, presenting for evaluation of left PCA territory acute CVA.  PAIN:  Are you having pain? No  PRECAUTIONS: Fall  WEIGHT BEARING RESTRICTIONS: No  OBJECTIVE:  TODAY'S TREATMENT:    LUE in sitting prior to session:                                                                                                                            Today's Vitals   06/01/22 1022  BP: 133/81  Pulse: 69   -SLS at counter progressed to 3 fingertip support over 2 minutes each LE -Alt tandem w/ countertop support progressed to single UE all fingertip support 2x52mn each LE -Standing feet together eyes open x30 seconds, no notable sway > feet together eyes closed 2x30 sec, moderate sway noted in last 15 seconds Variable amount of rest provided between reps  of balance exercises. -10MWT w/ SPC:  17.38 seconds = 0.58 m/sec OR 1.90 ft/sec  GAIT: Gait pattern: step through pattern, decreased arm swing- Left, decreased stance time- Right, decreased stride length, knee flexed in stance- Left, shuffling, trendelenburg, lateral lean- Right, poor foot clearance- Right, and poor foot clearance- Left Distance walked: 200' Assistive device utilized: Single point cane Level of assistance: SBA Comments: Notable right compensated trendelenburg.  Pt drifts to right during prolonged ambulatory task.  Cued for obstacle navigation for safety. -5xSTS no UE support:  26.59 sec, no LOB, mildly increased use of momentum on last rep  RAMP:  Level of Assistance: SBA Assistive device utilized: Single point cane Ramp Comments: Pt does not use cane on descent, edu on safety preventing excessive momentum or anterior LOB as he has increased crouched gait on descent.  CURB:  Level of Assistance: Modified independence Assistive device utilized: Single point  cane Curb Comments: Pt demonstrates adequate sequencing and safety with curb step.  STAIRS:  Level of Assistance: SBA  Stair Negotiation Technique: Alternating Pattern  Forwards with Single Rail on Right  Number of Stairs: 4   Height of Stairs: 6"  Comments: Pt has increased speed likely due to weakness and momentum descending stairs, discussed continuing to address this for safety.  Also edu to patient on ensuring both feet entirely on platform at top of stairs before turning to descend as he naturally does.   PATIENT EDUCATION: Education details: Continue HEP-additions and doing 2-3 exercises each day vs entire list to promote ease of compliance.  As STS exercise w/ hand support gets easier, take hands away. Person educated: Patient and Spouse Education method: Explanation, Demonstration, and Verbal cues Education comprehension: verbalized understanding and needs further education  HOME EXERCISE PROGRAM:   Clinical References  HIP: Extension, Bridging Bilateral    Place feet on surface. Tighten glutes, raise hips up. 12 reps per set, 1 sets per day, 4-5 days per week   Copyright  VHI. All rights reserved.    HIP: Extension / KNEE: Flexion, Standing    Bend knee, squeeze glutes. Move leg backward. 10 reps per set, 1 sets per day, 4-5 days per week Hold onto a support.  Copyright  VHI. All rights reserved.    Band Walk: Side Stepping    Tie band around legs, just above knees. Step 10 feet to one side, then step back to start. Repeat 40 feet per session. Note: Small towel between band and skin eases rubbing.   http://plyo.exer.us/76   Copyright  VHI. All rights reserved.    Marching In-Place    Standing straight, alternate bringing knees toward trunk. Arms swing alternately. Do 2 sets. Do 20 times per day.  Copyright  VHI. All rights reserved.    Sit to Stand / Stand to Sit / Transfers    Sit on edge of a solid chair with arms, feet flat on floor.  Lean forward over feet and stand up with hands on chair arms. Sit down slowly with hands on chair arms. Repeat 10 times per session. Do 1-2 sessions per day.  Copyright  VHI. All rights reserved.    Access Code: [email or web address] URL: [email or web address] Date: 06/01/2022 Prepared by: Elease Etienne  Exercises - Standing Single Leg Stance with Counter Support  - 1 x daily - 4 x weekly - 1 sets - 2 reps - 30 seconds hold - Standing Tandem Balance with Counter Support  - 1 x daily - 4 x weekly - 1 sets -  2 reps - 30 seconds hold - Corner Balance Feet Together With Eyes Closed  - 1 x daily - 5 x weekly - 1 sets - 2-3 reps - 30 seconds hold  GOALS: Goals reviewed with patient? Yes  SHORT TERM GOALS: Target date: 05/26/22  Pt will be IND with initial HEP in order to indicate improved functional mobility and dec fall risk. Baseline:  Pt states compliance and no issues (1/18) Goal status: MET  2.  Pt will improve gait speed to >/=2.62 ft/sec w/ SPC in order to indicate dec fall risk.  Baseline: 2.00 ft/sec; 1.90 ft/sec (1/18) Goal status: NOT MET  3.  Pt will ambulate 55' without at mod I level with only minimal Trendelenburg in order to indicate more independent household ambulation.   Baseline: ongoing compensated right trendelenburg x200' SBA (1/18) Goal status: IN PROGRESS  4.  Pt will improve FGA to >/=18/30 in order to indicate dec fall risk.   Baseline: 12/30 Goal status: INITIAL  5.  Pt will improve 5TSS to </=30 secs without UE support only in order to indicate dec fall risk and improved functional strength.   Baseline: approx 37 secs with hands on lap and mild anterior LOB; 26.59 sec no UE support or LOB (1/18) Goal status: MET  6.  Pt will negotiate up/down 4 steps with single rail/cane, up/down ramp and curb and ambulate x 500' outdoors over unlevel paved surfaces at S level in order to indicate improved community mobility.   Baseline: 4 stairs right rail,  curb and ramp SBA (1/18) Goal status: INITIAL  LONG TERM GOALS: Target date: 06/26/22  Pt will be IND with final HEP in order to indicate improved functional mobility and dec fall risk. Baseline:  Goal status: INITIAL  2.  Pt will improve FGA to >/=23/30 in order to indicate dec fall risk.   Baseline:  Goal status: INITIAL  3.  Pt will improve 5TSS to </=20 secs without UE support only in order to indicate dec fall risk and improved functional strength.   Baseline:  Goal status: INITIAL  4.  Pt will improve gait speed to >/=2.62 ft/sec w/o AD in order to indicate dec fall risk.  Baseline:  Goal status: INITIAL  5.  Pt will ambulate 230' without AD at mod I level in order to indicate more independent household ambulation.   Baseline:  Goal status: INITIAL  6.  Pt will negotiate up/down 4 steps with single rail, up/down ramp and curb and ambulate x 500' outdoors over unlevel paved surfaces without device at S level in order to indicate improved community mobility.   Baseline:  Goal status: INITIAL  ASSESSMENT:  CLINICAL IMPRESSION: Assessed STGs this visit with patient compliant to established HEP and additional static balance focused component of HEP established this visit.  His gait speed remains essentially unchanged from prior 2.0 ft/sec to current 1.90 ft/sec.  He continues to have notable trendelenburg during gait at SBA level tolerating 200' this session.  His 5xSTS improved to 26.59 seconds without UE support this visit indicating improving LE strength and power.  He is SBA for all stair, ramp and curb activity.  FGA and unlevel surface ambulation to be assessed at next visit.  OBJECTIVE IMPAIRMENTS: Abnormal gait, decreased activity tolerance, decreased balance, decreased endurance, decreased mobility, decreased strength, impaired perceived functional ability, and postural dysfunction.   ACTIVITY LIMITATIONS: carrying, lifting, bending, standing, squatting, stairs,  transfers, and locomotion level  PARTICIPATION LIMITATIONS: meal prep, cleaning, medication management, personal  finances, driving, and community activity  PERSONAL FACTORS: 3+ comorbidities: see above  are also affecting patient's functional outcome.   REHAB POTENTIAL: Good  CLINICAL DECISION MAKING: Evolving/moderate complexity  EVALUATION COMPLEXITY: Moderate  PLAN:  PT FREQUENCY: 2x/week  PT DURATION: 8 weeks  PLANNED INTERVENTIONS: Therapeutic exercises, Therapeutic activity, Neuromuscular re-education, Balance training, Gait training, Patient/Family education, Self Care, Stair training, Vestibular training, DME instructions, and Aquatic Therapy  PLAN FOR NEXT SESSION: ASSESS STGs 4 and last amb part of 6! Add to HEP prn for LE strengthening (hips) and also balance, gait with and without cane, stair negotiation.   Elease Etienne, PT, [facility] [address] [place], [place], [address] Phone: [phone]   Fax:  [phone] 06/01/22, 11:02 AM

## 2022-06-01 NOTE — Patient Instructions (Signed)
    Lie on back holding wand. Raise arms over head. Hold 5sec. Repeat 10 times per set.  Do 2 sessions per day.   ROM: Abduction - Wand   Holding wand with right hand palm up, push wand directly out to side, leading with other hand palm down, until stretch is felt. Hold 5 seconds. Repeat 10 times per set. Do 2 sessions per day.  SHOULDER: Flexion - Sitting    Hold cane with both hands. Raise arms up. Keep elbows straight. Repeat _10__ reps per set, _2__ sets per day,   ROM: Extension - Wand (Standing)   Stand holding wand behind back, palms facing forwards (not like picture). Raise arms back 3-6" off bottom, keeping posture up, standing up tall. Repeat 10 times per set.  Do 2 sessions per day.

## 2022-06-01 NOTE — Therapy (Signed)
OUTPATIENT OCCUPATIONAL THERAPY NEURO TREATMENT  Patient Name: Antonio Adams MRN: [id number] DOB:[birth year], 77 y.o., male Today's Date: 06/01/2022  PCP: Dr. Sharlet Salina REFERRING PROVIDER: Dr. Sharlet Salina  END OF SESSION:  OT End of Session - 06/01/22 0936     Visit Number 3    Number of Visits 25    Date for OT Re-Evaluation 07/26/22    Authorization Type [employer] Medicare    Progress Note Due on Visit 10    OT Start Time 0935    OT Stop Time 1015    OT Time Calculation (min) 40 min    Activity Tolerance Patient tolerated treatment well    Behavior During Therapy WFL for tasks assessed/performed             Past Medical History:  Diagnosis Date   ADENOCARCINOMA, PROSTATE, GLEASON GRADE 6 12/21/2009   prostate cancer   ALLERGIC RHINITIS 03/16/2007   Allergy    Anxiety    Arthritis    back    BACK PAIN WITH RADICULOPATHY 11/23/2008   Cancer of kidney ([place])    partial right kidney removed   CARPAL TUNNEL SYNDROME, BILATERAL 03/16/2007   Chronic back pain    Constipation    takes Senokot daily   Depression    occasionally   DIABETES MELLITUS, TYPE II 03/16/2007   takes Januvia and MEtformin daily   GASTROINTESTINAL HEMORRHAGE, HX OF 03/16/2007   GERD 03/16/2007   takes Omeprazole daily   Glaucoma    mild - no eye drops   Hemorrhoids    History of colon polyps    HYPERLIPIDEMIA 03/16/2007   takes Crestor daily   HYPERTENSION 03/16/2007   takes Diltiazem and Lisinopril daily    Kidney cancer, primary, with metastasis from kidney to other site [facility])    LEG CRAMPS 05/22/2007   Nocturia    Overactive bladder    PARESTHESIA 05/21/2007   Proctitis    PTSD (post-traumatic stress disorder)    wakes up may be dreaming of fighting   Rectal bleeding    Dr.Norins has explained its from the Radiation that he has received   Rectal bleeding    Renal cell carcinoma ([place])    Sleep apnea    uses CPAP nightly   Stomach cancer ([place])    Urinary frequency    takes Toviaz daily    Past Surgical History:  Procedure Laterality Date   ADRENALECTOMY Right 02/26/2020   Procedure: OPEN RIGHT RENAL CELL METASTATECTOMY AND  ADRENALECTOMY;  Surgeon: [name], MD;  Location: [facility];  Service: Urology;  Laterality: Right;   [place], 2008   repeat surgery; ESI '08   [place]     bilateral   CERVICAL FUSION     COLONOSCOPY     colonosocpy     ESOPHAGOGASTRODUODENOSCOPY     ED   FLEXIBLE SIGMOIDOSCOPY N/A 01/08/2017   Procedure: FLEXIBLE SIGMOIDOSCOPY;  Surgeon: Irene Shipper, MD;  Location: [place];  Service: Endoscopy;  Laterality: N/A;   HOT HEMOSTASIS N/A 01/08/2017   Procedure: HOT HEMOSTASIS (ARGON PLASMA COAGULATION/BICAP);  Surgeon: Irene Shipper, MD;  Location: [facility] ENDOSCOPY;  Service: Endoscopy;  Laterality: N/A;   IR RADIOLOGIST EVAL & MGMT  12/17/2019   IR RADIOLOGIST EVAL & MGMT  07/01/2020   IR RADIOLOGIST EVAL & MGMT  08/25/2020   LUMBAR LAMINECTOMY  10/30/2011   Procedure: MICRODISCECTOMY LUMBAR LAMINECTOMY;  Surgeon: Jessy Oto, MD;  Location: [place];  Service: Orthopedics;  Laterality: Right;  Right L4-5 and L5-S1 Microdiscectomy   LUMBAR LAMINECTOMY  06/17/2012   Procedure: MICRODISCECTOMY LUMBAR LAMINECTOMY;  Surgeon: Jessy Oto, MD;  Location: [place];  Service: Orthopedics;  Laterality: N/A;  Right L5-S1 microdiscectomy   OPEN PARTIAL HEPATECTOMY  N/A 02/26/2020   Procedure: OPEN PARTIAL HEPATECTOMY, CHOLECYSTECTOMY;  Surgeon: Stark Klein, MD;  Location: [facility];  Service: General;  Laterality: N/A;   POLYPECTOMY     PROSTATE SURGERY  march 2012   seed implant   RADIOFREQUENCY ABLATION N/A 07/21/2020   Procedure: CT MICROWAVE ABLATION;  Surgeon: Criselda Peaches, MD;  Location: [facility];  Service: Anesthesiology;  Laterality: N/A;   ROBOTIC ASSITED PARTIAL NEPHRECTOMY Right 09/08/2015   Procedure: XI ROBOTIC ASSITED RIGHT PARTIAL NEPHRECTOMY;  Surgeon: [name], MD;  Location: [facility];  Service: Urology;   Laterality: Right;  Clamp on: 1033 Clamp off: 1103 Total Clamp Time: 30 minutes   SHOULDER ARTHROSCOPY WITH SUBACROMIAL DECOMPRESSION Left 01/20/2016   Procedure: LEFT SHOULDER ARTHROSCOPY WITH EXTENSIVE  DEBRIDEMENT, ACROMIOPLASTY;  Surgeon: Ninetta Lights, MD;  Location: [place];  Service: Orthopedics;  Laterality: Left;   SHOULDER SURGERY  LFT   Stress Cardiolite  12/24/2003   negative for ischemia   UPPER ESOPHAGEAL ENDOSCOPIC ULTRASOUND (EUS)  04/18/2016   [facility]   UPPER GASTROINTESTINAL ENDOSCOPY     WOUND EXPLORATION     management of a wound that he sustained in the [place]   Patient Active Problem List   Diagnosis Date Noted   Hx of completed stroke 04/28/2022   Abnormal brain CT 04/07/2022   Encounter for general adult medical examination with abnormal findings 01/23/2022   Anemia due to zinc deficiency 12/20/2021   Low back pain 12/15/2021   Symptomatic anemia 12/15/2021   Cancer related pain 12/15/2021   Chronic heart failure with preserved ejection fraction (HFpEF) ([place]) 08/12/2021   Tinea cruris 08/12/2021   Anemia due to chronic kidney disease 07/20/2021   CKD (chronic kidney disease) stage 5, GFR less than 15 ml/min (HCC) 05/24/2021   BPH (benign prostatic hyperplasia) 05/24/2021   Diabetic renal disease ([place]) 05/20/2021   Dry eye syndrome of bilateral lacrimal glands 05/20/2021   Senile nuclear sclerosis 05/20/2021   Vitamin D deficiency 05/20/2021   Vitreous degeneration, unspecified eye 05/20/2021   Intrinsic eczema 04/18/2021   Atherosclerosis of aorta ([place]) 12/30/2020   Mixed conductive and sensorineural hearing loss of left ear with restricted hearing of right ear 09/01/2020   Tinnitus of both ears 05/20/2020   Metastatic renal cell carcinoma ([place]) 02/26/2020   Diabetic polyneuropathy associated with type 2 diabetes mellitus ([place]) 12/18/2019   Murmur, cardiac 10/08/2018   DDD (degenerative disc disease), cervical 10/08/2018   OAB  (overactive bladder) 04/04/2018   Seasonal allergic rhinitis due to pollen 11/01/2016   Microalbuminuria due to type 2 diabetes mellitus ([place]) 07/27/2016   Varicose veins of both lower extremities 08/06/2015   Varicose veins of lower extremities with ulcer ([employer] [employer] [place]) 06/16/2015   Internal hemorrhoid, bleeding 08/03/2014   Post traumatic stress disorder (PTSD) 01/29/2014   Spinal stenosis of lumbar region with radiculopathy 10/30/2011    Class: Chronic   Obstructive sleep apnea 11/24/2010   Obesity 11/24/2010   Routine general medical examination at a health care facility 11/24/2010   ADENOCARCINOMA, PROSTATE, GLEASON GRADE 6 12/21/2009   Type 2 diabetes mellitus with diabetic cataract ([place]) 03/16/2007   Essential hypertension 03/16/2007   GERD 03/16/2007    ONSET DATE: 04/07/22  REFERRING DIAG: Diagnosis Z86.73 (ICD-10-CM) - Personal history of transient ischemic attack (TIA), and cerebral infarction without residual deficits  THERAPY DIAG:  Muscle weakness (generalized)  Other lack of coordination  Visuospatial deficit  Rationale for Evaluation and Treatment: Rehabilitation  SUBJECTIVE:   SUBJECTIVE STATEMENT: No recent falls Pt accompanied by: significant other fiancee [name]  PERTINENT HISTORY: 77 y.o. R-handed male with history of DM2 with diabetic polyneuropathy, OSA on nightly CPAP, PTSD, history of lumbar stenosis with chronic back pain, metastatic renal carcinoma with lymphadenopathy and questionable hepatic involvement noted in 2022, not on chemo due to poor tolerance to the medications, and without significant progression since May 2023, only on surveillance imaging, stage IV, eczema, vitamin D deficiency, CKD stage V refusing HD, BPH, anemia of CKD  Zinc deficiency, chronic heart failure, mild cognitive impairment, s/p left PCA territory acute CVA on 04/07/22   DIAGNOSTIC FINDINGS:  MRI of the brain showing a left PCA large infarct and punctate right PCA infarct MRA  remarkable for left PCA P3 branch occlusion, ICA siphon atherosclerosis with possible tandem significant stenosis of the right siphon  PRECAUTIONS: Fall, hx of CA  WEIGHT BEARING RESTRICTIONS: No  PAIN:  Are you having pain? Yes 0/10 lumbar spine today (which is chronic - O.T. not addressing d/t chronic in nature)  FALLS: Has patient fallen in last 6 months? Yes. Number of falls 1  LIVING ENVIRONMENT: Lives with: lives alone, fiancee is there most of the time Lives in: House/apartment Stairs: 1 level stairs to enter Has following equipment at home: Single point cane  PLOF: Independent  PATIENT GOALS: to get right  OBJECTIVE:   HAND DOMINANCE: Right  ADLs: Overall ADLs: fall risk in standing Transfers/ambulation related to [place] Eating: mod I Grooming: mod I UB Dressing: mod I  LB Dressing: supervision, increased time required, therapist recommends pt sits for LB dressing due to fall risk Toileting: mod I using cane Bathing: supervision Tub Shower transfers: min A currently standing in shower, no grab bars, using tub shower does not have seat Equipment: none  IADLs: Shopping: has assistance Light housekeeping: fiancee handles Meal Prep: does not cook Community mobility: ambulates with cane, supervision-minguard, unsteady and walks close to items Medication management: fiancee is helping with meds Financial management: fiancee is assisting  Handwriting: 100% legible  MOBILITY STATUS:  ambulates with minguard with cane , some unsteadiness and walks close to walls and doorways     FUNCTIONAL OUTCOME MEASURES: FOTO: TBA  UPPER EXTREMITY ROM:  remaining AROM is WFLS   Active ROM Right eval Left eval  Shoulder flexion 105 105  Shoulder abduction    Shoulder adduction    Shoulder extension    Shoulder internal rotation    Shoulder external rotation    Elbow flexion    Elbow extension -15 -20  Wrist flexion    Wrist extension    Wrist ulnar  deviation    Wrist radial deviation    Wrist pronation    Wrist supination    (Blank rows = not tested)  UPPER EXTREMITY MMT:     MMT Right eval Left eval  Shoulder flexion 4-/5 3+/5  Shoulder abduction    Shoulder adduction    Shoulder extension    Shoulder internal rotation    Shoulder external rotation    Middle trapezius    Lower trapezius    Elbow flexion 4+/5 4+/5  Elbow extension 4/5 4/5  Wrist flexion    Wrist extension    Wrist ulnar deviation  Wrist radial deviation    Wrist pronation    Wrist supination    (Blank rows = not tested)  HAND FUNCTION: Grip strength: Right: 46.9 lbs; Left: 40.3 lbs  COORDINATION: 9 Hole Peg test: Right: 44 sec; Left: 40 sec   SENSATION: Light touch: Impaired  for ring and small finger R hand ulnar distribution, reports this was since CA treatment  EDEMA: n/a    COGNITION: Overall cognitive status: Impaired Decreased short term memory, delayed processing and decreased awareness  VISION:  Baseline vision: Wears glasses all the time   VISION ASSESSMENT: Number cancellation task 1.5 M : 7/80 missed, 91% Visual Fields: per hospital records R homonymous hemianopsia  Vision to be further assessed in a functional context  Patient has difficulty with following activities due to following visual impairments: ambulates close to walls and doorways on R side    OBSERVATIONS: Pt with minimal verbalizations and poor awareness of deficits   TODAY'S TREATMENT:                                                                                                                               Pt issued cane HEP - see pt instructions for details. Pt performed each x 10 reps  Reviewed coordination HEP w/ min cues. Pt return demo of each  Visual scanning - crossing out double #'s (11M print size) at approx 98% accuracy (only missed 1 entire page)    PATIENT EDUCATION: Education details: cane HEP,  review of coordination HEP   Person educated: Patient and Spouse Education method: Explanation, Demonstration, Verbal cues, and Handouts Education comprehension: verbalized understanding, returned demonstration, verbal cues required, and needs further education   HOME EXERCISE PROGRAM: 05/30/22: Visual scanning strategies, coordination HEP 06/01/22: cane HEP     GOALS: Goals reviewed with patient? Yes  SHORT TERM GOALS: Target date: 06/02/22  I with initial HEP : Goal status: MET w/ cues  2.  Pt will perform tub transfers safely mod I using tub bench prn. Baseline: doe not have tub bench yet, currently supervision, min A Goal status: INITIAL  3.  Pt/caregiver will verbalize understanding of compensations for visual deficits  Goal status: IN PROGRESS  4.  Pt will demonstrate improved fine motor coordination  for ADLs with RUE as evidenced by decreasing 9 hole peg test score by 3 secs Baseline: 9 Hole Peg test: Right: 44 sec; Left: 40 sec Goal status: IN PROGRESS  5.   Pt will navigate a busy environment and locate items with 75% accuracy without bumping into items. Baseline: nearly bumps into wall and doorway on right side Goal status: INITIAL  6.  Pt will verbalize understanding of compensations for short term memory deficits. Baseline:  Goal status: INITIAL   LONG TERM GOALS: Target date: 07/25/21  I with updated HEP Baseline:  Goal status: INITIAL  2.  Pt will navigate a busy environment and locate items with 90% accuracy. Baseline: nearly bumps into  wall and doorway on right side Goal status: INITIAL  3.  Pt will perform all basic ADLs mod I with good safety awareness. Baseline: supervision, min A Goal status: INITIAL  4.  Pt will demonstrate improved fine motor coordination with RUE  for ADLs as evidenced by decreasing 9 hole peg test score by 6 secs form initial eval. Baseline: 9 Hole Peg test: Right: 44 sec; Left: 40 sec Goal status: INITIAL  5.  Pt will demonstrate ability to  retrieve lightweight object at 110* shoulder flexion and -10 elbow extension with RUE Baseline: shoulder flexion 105, elbow extension -20 Goal status: INITIAL  6.  Pt will demonstrate ability to retrieve lightweight object at 110* shoulder flexion and -15 elbow extension with LUE Baseline: shoulder flexion 105, elbow extension -20 Goal status: inital  ASSESSMENT:  CLINICAL IMPRESSION: Pt requires reviews and cueing for reinforcement of HEP.  Pt met STG #1 and progressing towards remaining STG's  PERFORMANCE DEFICITS: in functional skills including ADLs, IADLs, coordination, dexterity, proprioception, sensation, tone, ROM, strength, flexibility, Fine motor control, Gross motor control, mobility, balance, endurance, decreased knowledge of precautions, decreased knowledge of use of DME, vision, and UE functional use, cognitive skills including energy/drive, learn, memory, problem solving, safety awareness, thought, and understand, and psychosocial skills including coping strategies, environmental adaptation, habits, interpersonal interactions, and routines and behaviors.   IMPAIRMENTS: are limiting patient from ADLs, IADLs, rest and sleep, play, leisure, and social participation.   CO-MORBIDITIES: may have co-morbidities  that affects occupational performance. Patient will benefit from skilled OT to address above impairments and improve overall function.  MODIFICATION OR ASSISTANCE TO COMPLETE EVALUATION: No modification of tasks or assist necessary to complete an evaluation.  OT OCCUPATIONAL PROFILE AND HISTORY: Detailed assessment: Review of records and additional review of physical, cognitive, psychosocial history related to current functional performance.  CLINICAL DECISION MAKING: LOW - limited treatment options, no task modification necessary  REHAB POTENTIAL: Good  EVALUATION COMPLEXITY: Low    PLAN:  OT FREQUENCY: 2x/week  OT DURATION: 12 weeks  PLANNED INTERVENTIONS: self  care/ADL training, therapeutic exercise, therapeutic activity, neuromuscular re-education, manual therapy, passive range of motion, gait training, balance training, stair training, functional mobility training, aquatic therapy, splinting, paraffin, fluidotherapy, moist heat, cryotherapy, contrast bath, patient/family education, cognitive remediation/compensation, visual/perceptual remediation/compensation, energy conservation, coping strategies training, DME and/or AE instructions, and Re-evaluation  RECOMMENDED OTHER SERVICES: PT, ST  CONSULTED AND AGREED WITH PLAN OF CARE: Patient and family member/caregiver  PLAN FOR NEXT SESSION: simulate tub transfer w/ bench, memory strategies, PVC pipe design and/or puzzle    Hans Eden, OT 06/01/2022, 9:36 AM

## 2022-06-01 NOTE — Patient Instructions (Signed)
Access Code: [email or web address] URL: [email or web address] Date: 06/01/2022 Prepared by: Elease Etienne  Exercises - Standing Single Leg Stance with Counter Support  - 1 x daily - 4 x weekly - 1 sets - 2 reps - 30 seconds hold - Standing Tandem Balance with Counter Support  - 1 x daily - 4 x weekly - 1 sets - 2 reps - 30 seconds hold - Corner Balance Feet Together With Eyes Closed  - 1 x daily - 5 x weekly - 1 sets - 2-3 reps - 30 seconds hold

## 2022-06-04 ENCOUNTER — Encounter (HOSPITAL_BASED_OUTPATIENT_CLINIC_OR_DEPARTMENT_OTHER): Payer: Self-pay | Admitting: Cardiology

## 2022-06-06 ENCOUNTER — Ambulatory Visit: Payer: Medicare Other | Admitting: Speech Pathology

## 2022-06-06 ENCOUNTER — Ambulatory Visit: Payer: Medicare Other | Admitting: Physical Therapy

## 2022-06-06 ENCOUNTER — Encounter: Payer: Self-pay | Admitting: Physical Therapy

## 2022-06-06 ENCOUNTER — Ambulatory Visit: Payer: Medicare Other | Admitting: Occupational Therapy

## 2022-06-06 VITALS — BP 165/86 | HR 72

## 2022-06-06 DIAGNOSIS — R41842 Visuospatial deficit: Secondary | ICD-10-CM

## 2022-06-06 DIAGNOSIS — R2681 Unsteadiness on feet: Secondary | ICD-10-CM

## 2022-06-06 DIAGNOSIS — R2689 Other abnormalities of gait and mobility: Secondary | ICD-10-CM

## 2022-06-06 DIAGNOSIS — R278 Other lack of coordination: Secondary | ICD-10-CM | POA: Diagnosis not present

## 2022-06-06 DIAGNOSIS — R41841 Cognitive communication deficit: Secondary | ICD-10-CM

## 2022-06-06 DIAGNOSIS — M6281 Muscle weakness (generalized): Secondary | ICD-10-CM

## 2022-06-06 DIAGNOSIS — R41840 Attention and concentration deficit: Secondary | ICD-10-CM

## 2022-06-06 NOTE — Patient Instructions (Signed)
Memory Compensation Strategies  Use "WARM" strategy.  W= write it down  A= associate it  R= repeat it  M= make a mental note  2.   You can keep a Memory Notebook.  Use a 3-ring notebook with sections for the following: calendar, important names and phone numbers, medications, doctors' names/phone numbers, lists/reminders, and a section to journal what you did each day.   3.    Use a calendar to write appointments down.  4.    Write yourself a schedule for the day.  This can be placed on the calendar or in a separate section of the Memory Notebook.  Keeping a  regular schedule can help memory.  5.    Use medication organizer with sections for each day or morning/evening pills.  You may need help loading it  6.    Keep a basket, or pegboard by the door.  Place items that you need to take out with you in the basket or on the pegboard.  You may also want to include a message board for reminders.  7.    Use sticky notes.  Place sticky notes with reminders in a place where the task is performed.  For example: " turn off the  stove" placed by the stove, "lock the door" placed on the door at eye level, " take your medications" on the bathroom mirror or by the place where you normally take your medications.  8.    Use alarms/timers.  Use while cooking to remind yourself to check on food or as a reminder to take your medicine, or as a  reminder to make a call, or as a reminder to perform another task, etc.  

## 2022-06-06 NOTE — Therapy (Signed)
OUTPATIENT PHYSICAL THERAPY NEURO TREATMENT   Patient Name: Antonio Adams MRN: [id number] DOB:1945-10-16, 77 y.o., male Today's Date: 06/06/2022   PCP: Scarlette Calico, MD REFERRING PROVIDER: Pricilla Holm, MD  END OF SESSION:  PT End of Session - 06/06/22 0856     Visit Number 4    Number of Visits 17    Date for PT Re-Evaluation 06/26/22    Authorization Type [employer] Medicare    PT Start Time [phone]    PT Stop Time 0930    PT Time Calculation (min) 44 min    Equipment Utilized During Treatment Gait belt    Activity Tolerance Patient tolerated treatment well    Behavior During Therapy WFL for tasks assessed/performed             Past Medical History:  Diagnosis Date   ADENOCARCINOMA, PROSTATE, GLEASON GRADE 6 12/21/2009   prostate cancer   ALLERGIC RHINITIS 03/16/2007   Allergy    Anxiety    Arthritis    back    BACK PAIN WITH RADICULOPATHY 11/23/2008   Cancer of kidney ([place])    partial right kidney removed   CARPAL TUNNEL SYNDROME, BILATERAL 03/16/2007   Chronic back pain    Constipation    takes Senokot daily   Depression    occasionally   DIABETES MELLITUS, TYPE II 03/16/2007   takes Januvia and MEtformin daily   GASTROINTESTINAL HEMORRHAGE, HX OF 03/16/2007   GERD 03/16/2007   takes Omeprazole daily   Glaucoma    mild - no eye drops   Hemorrhoids    History of colon polyps    HYPERLIPIDEMIA 03/16/2007   takes Crestor daily   HYPERTENSION 03/16/2007   takes Diltiazem and Lisinopril daily    Kidney cancer, primary, with metastasis from kidney to other site [facility]    LEG CRAMPS 05/22/2007   Nocturia    Overactive bladder    PARESTHESIA 05/21/2007   Proctitis    PTSD (post-traumatic stress disorder)    wakes up may be dreaming of fighting   Rectal bleeding    Dr.Norins has explained its from the Radiation that he has received   Rectal bleeding    Renal cell carcinoma ([place])    Sleep apnea    uses CPAP nightly   Stomach cancer ([place])    Urinary frequency     takes Toviaz daily   Past Surgical History:  Procedure Laterality Date   ADRENALECTOMY Right 02/26/2020   Procedure: OPEN RIGHT RENAL CELL METASTATECTOMY AND  ADRENALECTOMY;  Surgeon: [name], MD;  Location: [facility];  Service: Urology;  Laterality: Right;   [place], 2008   repeat surgery; ESI '08   [place]     bilateral   CERVICAL FUSION     COLONOSCOPY     colonosocpy     ESOPHAGOGASTRODUODENOSCOPY     ED   FLEXIBLE SIGMOIDOSCOPY N/A 01/08/2017   Procedure: FLEXIBLE SIGMOIDOSCOPY;  Surgeon: Irene Shipper, MD;  Location: [place];  Service: Endoscopy;  Laterality: N/A;   HOT HEMOSTASIS N/A 01/08/2017   Procedure: HOT HEMOSTASIS (ARGON PLASMA COAGULATION/BICAP);  Surgeon: Irene Shipper, MD;  Location: [facility];  Service: Endoscopy;  Laterality: N/A;   IR RADIOLOGIST EVAL & MGMT  12/17/2019   IR RADIOLOGIST EVAL & MGMT  07/01/2020   IR RADIOLOGIST EVAL & MGMT  08/25/2020   LUMBAR LAMINECTOMY  10/30/2011   Procedure: MICRODISCECTOMY LUMBAR LAMINECTOMY;  Surgeon: Jessy Oto, MD;  Location: [place];  Service: Orthopedics;  Laterality: Right;  Right L4-5 and L5-S1 Microdiscectomy   LUMBAR LAMINECTOMY  06/17/2012   Procedure: MICRODISCECTOMY LUMBAR LAMINECTOMY;  Surgeon: Jessy Oto, MD;  Location: [place];  Service: Orthopedics;  Laterality: N/A;  Right L5-S1 microdiscectomy   OPEN PARTIAL HEPATECTOMY  N/A 02/26/2020   Procedure: OPEN PARTIAL HEPATECTOMY, CHOLECYSTECTOMY;  Surgeon: Stark Klein, MD;  Location[facility];  Service: General;  Laterality: N/A;   POLYPECTOMY     PROSTATE SURGERY  march 2012   seed implant   RADIOFREQUENCY ABLATION N/A 07/21/2020   Procedure: CT MICROWAVE ABLATION;  Surgeon: Criselda Peaches, MD;  Location: [facility];  Service: Anesthesiology;  Laterality: N/A;   ROBOTIC ASSITED PARTIAL NEPHRECTOMY Right 09/08/2015   Procedure: XI ROBOTIC ASSITED RIGHT PARTIAL NEPHRECTOMY;  Surgeon: [name], MD;  [facility];   Service: Urology;  Laterality: Right;  Clamp on: 1033 Clamp off: 1103 Total Clamp Time: 30 minutes   SHOULDER ARTHROSCOPY WITH SUBACROMIAL DECOMPRESSION Left 01/20/2016   Procedure: LEFT SHOULDER ARTHROSCOPY WITH EXTENSIVE  DEBRIDEMENT, ACROMIOPLASTY;  Surgeon: Ninetta Lights, MD;  Location: [place];  Service: Orthopedics;  Laterality: Left;   SHOULDER SURGERY  LFT   Stress Cardiolite  12/24/2003   negative for ischemia   UPPER ESOPHAGEAL ENDOSCOPIC ULTRASOUND (EUS)  04/18/2016   [facility]   UPPER GASTROINTESTINAL ENDOSCOPY     WOUND EXPLORATION     management of a wound that he sustained in the [place]   Patient Active Problem List   Diagnosis Date Noted   Hx of completed stroke 04/28/2022   Abnormal brain CT 04/07/2022   Encounter for general adult medical examination with abnormal findings 01/23/2022   Anemia due to zinc deficiency 12/20/2021   Low back pain 12/15/2021   Symptomatic anemia 12/15/2021   Cancer related pain 12/15/2021   Chronic heart failure with preserved ejection fraction (HFpEF) ([place]) 08/12/2021   Tinea cruris 08/12/2021   Anemia due to chronic kidney disease 07/20/2021   CKD (chronic kidney disease) stage 5, GFR less than 15 ml/min (HCC) 05/24/2021   BPH (benign prostatic hyperplasia) 05/24/2021   Diabetic renal disease ([place]) 05/20/2021   Dry eye syndrome of bilateral lacrimal glands 05/20/2021   Senile nuclear sclerosis 05/20/2021   Vitamin D deficiency 05/20/2021   Vitreous degeneration, unspecified eye 05/20/2021   Intrinsic eczema 04/18/2021   Atherosclerosis of aorta ([place]) 12/30/2020   Mixed conductive and sensorineural hearing loss of left ear with restricted hearing of right ear 09/01/2020   Tinnitus of both ears 05/20/2020   Metastatic renal cell carcinoma ([place]) 02/26/2020   Diabetic polyneuropathy associated with type 2 diabetes mellitus ([place]) 12/18/2019   Murmur, cardiac 10/08/2018   DDD (degenerative disc disease), cervical  10/08/2018   OAB (overactive bladder) 04/04/2018   Seasonal allergic rhinitis due to pollen 11/01/2016   Microalbuminuria due to type 2 diabetes mellitus ([place]) 07/27/2016   Varicose veins of both lower extremities 08/06/2015   Varicose veins of lower extremities with ulcer ([place]) 06/16/2015   Internal hemorrhoid, bleeding 08/03/2014   Post traumatic stress disorder (PTSD) 01/29/2014   Spinal stenosis of lumbar region with radiculopathy 10/30/2011    Class: Chronic   Obstructive sleep apnea 11/24/2010   Obesity 11/24/2010   Routine general medical examination at a health care facility 11/24/2010   ADENOCARCINOMA, PROSTATE, GLEASON GRADE 6 12/21/2009   Type 2 diabetes mellitus with diabetic cataract ([place]) 03/16/2007   Essential hypertension 03/16/2007   GERD 03/16/2007  ONSET DATE: 04/07/22  REFERRING DIAG: L93.79 (ICD-10-CM) - Hx of completed stroke  THERAPY DIAG:  Muscle weakness (generalized)  Other lack of coordination  Unsteadiness on feet  Other abnormalities of gait and mobility  Rationale for Evaluation and Treatment: Rehabilitation  SUBJECTIVE:                                                                                                                                                                                             SUBJECTIVE STATEMENT: Pt has not been feeling well the past couple of days.  He was unable to take any meds but his kidney medicine this morning due to early appt, but feels this won't be an issue going forward as appts are later in the morning. He denies falls or near falls.  He inquires about shoes that will make him more balanced and states he feels more off balance this morning.  He has not had much chance to do HEP the past few days. Pt accompanied by: significant other [place])  PERTINENT HISTORY: SALIF TAY is a 77 y.o. R-handed male with history of DM2 with diabetic polyneuropathy, OSA on nightly CPAP, PTSD, history of lumbar  stenosis with chronic back pain, metastatic renal carcinoma with lymphadenopathy and questionable hepatic involvement noted in 2022, not on chemo due to poor tolerance to the medications, and without significant progression since May 2023, only on surveillance imaging, stage IV, eczema, vitamin D deficiency, CKD stage V refusing HD, BPH, anemia of CKD  Zinc deficiency, chronic heart failure, mild cognitive impairment, presenting for evaluation of left PCA territory acute CVA.  PAIN:  Are you having pain? No  PRECAUTIONS: Fall  WEIGHT BEARING RESTRICTIONS: No  OBJECTIVE:  TODAY'S TREATMENT:    LUE in sitting prior to session:                                                                                                                            Today's Vitals   06/06/22 0852 06/06/22 0930  BP: (!) 155/94 (!) 165/86  Pulse: 72 72  -Discussed always trying  shoes on for both toe box fit and change in height of sole as well as weight of shoe and how this might affect balance due to functional weakness.  Pt and fiance verbalize understanding and concern over a pair of heavy shoes that made patient fall previously.  -FGA:  [facility] PT Assessment - 06/06/22 0900       Functional Gait  Assessment   Gait assessed  Yes    Gait Level Surface Walks 20 ft, slow speed, abnormal gait pattern, evidence for imbalance or deviates 10-15 in outside of the 12 in walkway width. Requires more than 7 sec to ambulate 20 ft.    Change in Gait Speed Makes only minor adjustments to walking speed, or accomplishes a change in speed with significant gait deviations, deviates 10-15 in outside the 12 in walkway width, or changes speed but loses balance but is able to recover and continue walking.    Gait with Horizontal Head Turns Performs head turns with moderate changes in gait velocity, slows down, deviates 10-15 in outside 12 in walkway width but recovers, can continue to walk.    Gait with Vertical Head Turns Performs  task with moderate change in gait velocity, slows down, deviates 10-15 in outside 12 in walkway width but recovers, can continue to walk.    Gait and Pivot Turn Turns slowly, requires verbal cueing, or requires several small steps to catch balance following turn and stop    Step Over Obstacle Is able to step over one shoe box (4.5 in total height) but must slow down and adjust steps to clear box safely. May require verbal cueing.    Gait with Narrow Base of Support Ambulates less than 4 steps heel to toe or cannot perform without assistance.    Gait with Eyes Closed Walks 20 ft, slow speed, abnormal gait pattern, evidence for imbalance, deviates 10-15 in outside 12 in walkway width. Requires more than 9 sec to ambulate 20 ft.    Ambulating Backwards Walks 20 ft, uses assistive device, slower speed, mild gait deviations, deviates 6-10 in outside 12 in walkway width.    Steps Alternating feet, must use rail.    Total Score 11    FGA comment: 11/30 = high fall risk            -30lb bilateral leg press w/ therapist facilitating form and assisting pt in maintaining feet on platform x10 reps -Forward step ups alt LE x15 each LE > x10 (left and right) lateral step ups -STS from blue dynadisc x5 > STS from blue dynadisc w/ airex under feet no UE support minA x5, pt has several failed reps of this w/o physical assistance -Reassessed BP (see second reading above).  PATIENT EDUCATION: Education details: Continue HEP-additions and doing 2-3 exercises each day vs entire list to promote ease of compliance.  As STS exercise w/ hand support gets easier, take hands away. Person educated: Patient and Spouse Education method: Explanation, Demonstration, and Verbal cues Education comprehension: verbalized understanding and needs further education  HOME EXERCISE PROGRAM:   Clinical References  HIP: Extension, Bridging Bilateral    Place feet on surface. Tighten glutes, raise hips up. 12 reps per set, 1  sets per day, 4-5 days per week   Copyright  VHI. All rights reserved.    HIP: Extension / KNEE: Flexion, Standing    Bend knee, squeeze glutes. Move leg backward. 10 reps per set, 1 sets per day, 4-5 days per week Hold onto a support.  Copyright  VHI. All rights reserved.    Band Walk: Side Stepping    Tie band around legs, just above knees. Step 10 feet to one side, then step back to start. Repeat 40 feet per session. Note: Small towel between band and skin eases rubbing.   http://plyo.exer.us/76   Copyright  VHI. All rights reserved.    Marching In-Place    Standing straight, alternate bringing knees toward trunk. Arms swing alternately. Do 2 sets. Do 20 times per day.  Copyright  VHI. All rights reserved.    Sit to Stand / Stand to Sit / Transfers    Sit on edge of a solid chair with arms, feet flat on floor. Lean forward over feet and stand up with hands on chair arms. Sit down slowly with hands on chair arms. Repeat 10 times per session. Do 1-2 sessions per day.  Copyright  VHI. All rights reserved.    Access Code: [email or web address] URL: [email or web address] Date: 06/01/2022 Prepared by: Elease Etienne  Exercises - Standing Single Leg Stance with Counter Support  - 1 x daily - 4 x weekly - 1 sets - 2 reps - 30 seconds hold - Standing Tandem Balance with Counter Support  - 1 x daily - 4 x weekly - 1 sets - 2 reps - 30 seconds hold - Corner Balance Feet Together With Eyes Closed  - 1 x daily - 5 x weekly - 1 sets - 2-3 reps - 30 seconds hold  GOALS: Goals reviewed with patient? Yes  SHORT TERM GOALS: Target date: 05/26/22  Pt will be IND with initial HEP in order to indicate improved functional mobility and dec fall risk. Baseline:  Pt states compliance and no issues (1/18) Goal status: MET  2.  Pt will improve gait speed to >/=2.62 ft/sec w/ SPC in order to indicate dec fall risk.  Baseline: 2.00 ft/sec; 1.90 ft/sec (1/18) Goal  status: NOT MET  3.  Pt will ambulate 34' without at mod I level with only minimal Trendelenburg in order to indicate more independent household ambulation.   Baseline: ongoing compensated right trendelenburg x200' SBA (1/18) Goal status: IN PROGRESS  4.  Pt will improve FGA to >/=18/30 in order to indicate dec fall risk.   Baseline: 12/30; 11/30 (1/23) Goal status: NOT MET  5.  Pt will improve 5TSS to </=30 secs without UE support only in order to indicate dec fall risk and improved functional strength.   Baseline: approx 37 secs with hands on lap and mild anterior LOB; 26.59 sec no UE support or LOB (1/18) Goal status: MET  6.  Pt will negotiate up/down 4 steps with single rail/cane, up/down ramp and curb and ambulate x 500' outdoors over unlevel paved surfaces at S level in order to indicate improved community mobility.   Baseline: 4 stairs right rail, curb and ramp SBA (1/18); pt declines to ambulate outside due to cold weather (1/23) Goal status: IN PROGRESS  LONG TERM GOALS: Target date: 06/26/22  Pt will be IND with final HEP in order to indicate improved functional mobility and dec fall risk. Baseline:  Goal status: INITIAL  2.  Pt will improve FGA to >/=23/30 in order to indicate dec fall risk.   Baseline:  Goal status: INITIAL  3.  Pt will improve 5TSS to </=20 secs without UE support only in order to indicate dec fall risk and improved functional strength.   Baseline:  Goal status: INITIAL  4.  Pt will improve gait speed to >/=  2.62 ft/sec w/o AD in order to indicate dec fall risk.  Baseline:  Goal status: INITIAL  5.  Pt will ambulate 230' without AD at mod I level in order to indicate more independent household ambulation.   Baseline:  Goal status: INITIAL  6.  Pt will negotiate up/down 4 steps with single rail, up/down ramp and curb and ambulate x 500' outdoors over unlevel paved surfaces without device at S level in order to indicate improved community  mobility.   Baseline:  Goal status: INITIAL  ASSESSMENT:  CLINICAL IMPRESSION: Patient's diastolic BP mildly elevated at onset of session today as he did not take medicines this morning due to time of appt.  He has made modest progress towards goals with FGA remaining essentially unchanged from prior assessment at 11/30, but he has made progress towards his remaining goals.  Will continue to address remaining deficits especially hip strength and safety w/ ambulatory tasks in order to improve functional mobility.  OBJECTIVE IMPAIRMENTS: Abnormal gait, decreased activity tolerance, decreased balance, decreased endurance, decreased mobility, decreased strength, impaired perceived functional ability, and postural dysfunction.   ACTIVITY LIMITATIONS: carrying, lifting, bending, standing, squatting, stairs, transfers, and locomotion level  PARTICIPATION LIMITATIONS: meal prep, cleaning, medication management, personal finances, driving, and community activity  PERSONAL FACTORS: 3+ comorbidities: see above  are also affecting patient's functional outcome.   REHAB POTENTIAL: Good  CLINICAL DECISION MAKING: Evolving/moderate complexity  EVALUATION COMPLEXITY: Moderate  PLAN:  PT FREQUENCY: 2x/week  PT DURATION: 8 weeks  PLANNED INTERVENTIONS: Therapeutic exercises, Therapeutic activity, Neuromuscular re-education, Balance training, Gait training, Patient/Family education, Self Care, Stair training, Vestibular training, DME instructions, and Aquatic Therapy  PLAN FOR NEXT SESSION: Add to HEP prn for LE strengthening (hips) and also balance, gait with and without cane, dynamic stability, gait speed, backwards ambulation, narrowed BOS, compliant surfaces   Elease Etienne, PT, DPT [facility] [address] [place], [place], [address] Phone: [phone]   Fax:  [phone] 06/06/22, 9:38 AM

## 2022-06-06 NOTE — Therapy (Signed)
OUTPATIENT SPEECH LANGUAGE PATHOLOGY TREATMENT   Patient Name: Antonio Adams MRN: [id number] DOB:22-Jun-1945, 77 y.o., male Today's Date: 06/06/2022  PCP: [name]., MD REFERRING PROVIDER: Hoyt Koch., MD  END OF SESSION:  End of Session - 06/06/22 1019     Visit Number 4    Number of Visits 25    Date for SLP Re-Evaluation 07/26/22    Authorization Type [employer] Medicare    SLP Start Time 1019    SLP Stop Time  1100    SLP Time Calculation (min) 41 min    Activity Tolerance Patient tolerated treatment well               Past Medical History:  Diagnosis Date   ADENOCARCINOMA, PROSTATE, GLEASON GRADE 6 12/21/2009   prostate cancer   ALLERGIC RHINITIS 03/16/2007   Allergy    Anxiety    Arthritis    back    BACK PAIN WITH RADICULOPATHY 11/23/2008   Cancer of kidney ([place])    partial right kidney removed   CARPAL TUNNEL SYNDROME, BILATERAL 03/16/2007   Chronic back pain    Constipation    takes Senokot daily   Depression    occasionally   DIABETES MELLITUS, TYPE II 03/16/2007   takes Januvia and MEtformin daily   GASTROINTESTINAL HEMORRHAGE, HX OF 03/16/2007   GERD 03/16/2007   takes Omeprazole daily   Glaucoma    mild - no eye drops   Hemorrhoids    History of colon polyps    HYPERLIPIDEMIA 03/16/2007   takes Crestor daily   HYPERTENSION 03/16/2007   takes Diltiazem and Lisinopril daily    Kidney cancer, primary, with metastasis from kidney to other site [facility])    LEG CRAMPS 05/22/2007   Nocturia    Overactive bladder    PARESTHESIA 05/21/2007   Proctitis    PTSD (post-traumatic stress disorder)    wakes up may be dreaming of fighting   Rectal bleeding    Dr.Norins has explained its from the Radiation that he has received   Rectal bleeding    Renal cell carcinoma ([place])    Sleep apnea    uses CPAP nightly   Stomach cancer ([place])    Urinary frequency    takes Toviaz daily   Past Surgical History:  Procedure Laterality Date   ADRENALECTOMY  Right 02/26/2020   Procedure: OPEN RIGHT RENAL CELL METASTATECTOMY AND  ADRENALECTOMY;  Surgeon: [name], MD;  Location: [facility];  Service: Urology;  Laterality: Right;   [place], 2008   repeat surgery; ESI '08   [place]     bilateral   CERVICAL FUSION     COLONOSCOPY     colonosocpy     ESOPHAGOGASTRODUODENOSCOPY     ED   FLEXIBLE SIGMOIDOSCOPY N/A 01/08/2017   Procedure: FLEXIBLE SIGMOIDOSCOPY;  Surgeon: Irene Shipper, MD;  Location: [place];  Service: Endoscopy;  Laterality: N/A;   HOT HEMOSTASIS N/A 01/08/2017   Procedure: HOT HEMOSTASIS (ARGON PLASMA COAGULATION/BICAP);  Surgeon: Irene Shipper, MD;  Location: [facility] ENDOSCOPY;  Service: Endoscopy;  Laterality: N/A;   IR RADIOLOGIST EVAL & MGMT  12/17/2019   IR RADIOLOGIST EVAL & MGMT  07/01/2020   IR RADIOLOGIST EVAL & MGMT  08/25/2020   LUMBAR LAMINECTOMY  10/30/2011   Procedure: MICRODISCECTOMY LUMBAR LAMINECTOMY;  Surgeon: Jessy Oto, MD;  Location: [place];  Service: Orthopedics;  Laterality: Right;  Right L4-5 and L5-S1 Microdiscectomy   LUMBAR LAMINECTOMY  06/17/2012   Procedure: MICRODISCECTOMY LUMBAR LAMINECTOMY;  Surgeon: Jessy Oto, MD;  Location: [place];  Service: Orthopedics;  Laterality: N/A;  Right L5-S1 microdiscectomy   OPEN PARTIAL HEPATECTOMY  N/A 02/26/2020   Procedure: OPEN PARTIAL HEPATECTOMY, CHOLECYSTECTOMY;  Surgeon: Stark Klein, MD;  Location: [facility];  Service: General;  Laterality: N/A;   POLYPECTOMY     PROSTATE SURGERY  march 2012   seed implant   RADIOFREQUENCY ABLATION N/A 07/21/2020   Procedure: CT MICROWAVE ABLATION;  Surgeon: Criselda Peaches, MD;  Location: [facility];  Service: Anesthesiology;  Laterality: N/A;   ROBOTIC ASSITED PARTIAL NEPHRECTOMY Right 09/08/2015   Procedure: XI ROBOTIC ASSITED RIGHT PARTIAL NEPHRECTOMY;  Surgeon: [name], MD;  Location: [facility];  Service: Urology;  Laterality: Right;  Clamp on: 1033 Clamp off: 1103 Total Clamp Time: 30  minutes   SHOULDER ARTHROSCOPY WITH SUBACROMIAL DECOMPRESSION Left 01/20/2016   Procedure: LEFT SHOULDER ARTHROSCOPY WITH EXTENSIVE  DEBRIDEMENT, ACROMIOPLASTY;  Surgeon: Ninetta Lights, MD;  Location: [place];  Service: Orthopedics;  Laterality: Left;   SHOULDER SURGERY  LFT   Stress Cardiolite  12/24/2003   negative for ischemia   UPPER ESOPHAGEAL ENDOSCOPIC ULTRASOUND (EUS)  04/18/2016   [facility]   UPPER GASTROINTESTINAL ENDOSCOPY     WOUND EXPLORATION     management of a wound that he sustained in the [facility]   Patient Active Problem List   Diagnosis Date Noted   Hx of completed stroke 04/28/2022   Abnormal brain CT 04/07/2022   Encounter for general adult medical examination with abnormal findings 01/23/2022   Anemia due to zinc deficiency 12/20/2021   Low back pain 12/15/2021   Symptomatic anemia 12/15/2021   Cancer related pain 12/15/2021   Chronic heart failure with preserved ejection fraction (HFpEF) ([place]) 08/12/2021   Tinea cruris 08/12/2021   Anemia due to chronic kidney disease 07/20/2021   CKD (chronic kidney disease) stage 5, GFR less than 15 ml/min (HCC) 05/24/2021   BPH (benign prostatic hyperplasia) 05/24/2021   Diabetic renal disease ([place]) 05/20/2021   Dry eye syndrome of bilateral lacrimal glands 05/20/2021   Senile nuclear sclerosis 05/20/2021   Vitamin D deficiency 05/20/2021   Vitreous degeneration, unspecified eye 05/20/2021   Intrinsic eczema 04/18/2021   Atherosclerosis of aorta ([place]) 12/30/2020   Mixed conductive and sensorineural hearing loss of left ear with restricted hearing of right ear 09/01/2020   Tinnitus of both ears 05/20/2020   Metastatic renal cell carcinoma ([place]) 02/26/2020   Diabetic polyneuropathy associated with type 2 diabetes mellitus ([place]) 12/18/2019   Murmur, cardiac 10/08/2018   DDD (degenerative disc disease), cervical 10/08/2018   OAB (overactive bladder) 04/04/2018   Seasonal allergic rhinitis due to  pollen 11/01/2016   Microalbuminuria due to type 2 diabetes mellitus ([place]) 07/27/2016   Varicose veins of both lower extremities 08/06/2015   Varicose veins of lower extremities with ulcer ([place]) 06/16/2015   Internal hemorrhoid, bleeding 08/03/2014   Post traumatic stress disorder (PTSD) 01/29/2014   Spinal stenosis of lumbar region with radiculopathy 10/30/2011    Class: Chronic   Obstructive sleep apnea 11/24/2010   Obesity 11/24/2010   Routine general medical examination at a health care facility 11/24/2010   ADENOCARCINOMA, PROSTATE, GLEASON GRADE 6 12/21/2009   Type 2 diabetes mellitus with diabetic cataract ([place]) 03/16/2007   Essential hypertension 03/16/2007   GERD 03/16/2007    ONSET DATE: 04-07-22   REFERRING DIAG: I50.27 (ICD-10-CM) - Personal history of transient ischemic attack (  TIA), and cerebral infarction without residual deficits   THERAPY DIAG:  Cognitive communication deficit  Rationale for Evaluation and Treatment: Rehabilitation  SUBJECTIVE:   SUBJECTIVE STATEMENT: "I'm making it"   PAIN:  Are you having pain? No  OBJECTIVE:   STANDARDIZED ASSESSMENTS: SLP initiated CLQT: Attention: Mild, Memory: Severe, Executive Function: Severe, Language: Moderate, Visuospatial Skills: Mild, and Clock Drawing: WNL Overall Severity: Moderate   TODAY'S TREATMENT:   06-06-22: Pt completed medication chart, with verification of accuracy by s/o, [name]. ID challenge of taking morning medication per advised time d/t difficulty getting sleep. Pt reports is waking around 3 AM and is unable to fall asleep until early morning which then results in sleeping until noon. Despite being awake early this morning for therapy, did not take meds d/t decreased appetite and time for breakfast. SLP A pt in generating list of quick and simple morning meals pt could try to facilitate medication administration on days that he must be up and out of door early. Pt generates x3 items with mod-I. SLP  advises on sleep hygiene habits which may aid in improved sleep duration and quality-- create sleep pressure by sticking to routine, turn off tv in bedroom, creating nighttime routine, viewing morning sunlight for 5-10 minutes on sunny days, longer on cloudy days. Education on role of sleep in clarity of thought, encoding memories, and decision making.   05-30-22: Pt accompanied by spouse this session. Further CLQT eval completed this date. SLP observed pt difficulty with problem solving, organization, and planning during maze portion. Difficulty following directions during pattern creation task.   SLP introduced utilization of external aids/strategies (pill box, visual schedule) for medication management. Pt unable to recall any of his medications and required max A to add 8 medications to chart. Given name of medication, pt provided purpose of med in 1/6 opportunities. Demonstrates deviation in attention and working memory during completion of task.   05-25-22: PT ambulates into ST office, with occasional demonstration of impaired balance. Pt denies headache, speech presents as WNL. SLP takes pt's BP, with initial reading of 161/91, subsequent reading after 10 minute latency period of 162/94. Pt reports did not take morning medications d/t not having time for breakfast. SLP advised pt will not be able to be seen for PT d/t raised BP and pt should routinely take medications at least 1 hr prior to therapy appointments. Pt and s/o verbalize agreement and undertanding. Pt agreeable to completion of PROM. SLP A in completion of Cognitive Function PROM with approx half completed. SLP elected to cease administration d/t pt selecting "0" or "none" for all items despite s/o providing examples of impairments evidenced. Instead asked for input from s/o of functional goals with the following ID: Med administration adherence Med sorting Orientation to daily tasks and schedule  Memory for intended actions or verbal  expression Ability to navigate to and from places in community  Will plan to complete standardized cognitive assessment at next session when pt's BP is controled.   05-03-22: Education provided on evaluation results and SLP's recommendations. Initiated training re: orientation aids. Pt verbalizes agreement with POC, all questions answered to satisfaction.    PATIENT EDUCATION: Education details: see above Person educated: Patient and Spouse Education method: Explanation, Demonstration, and Verbal cues Education comprehension: verbalized understanding, returned demonstration, and needs further education   GOALS: Goals reviewed with patient? Yes  SHORT TERM GOALS: Target date: 05-31-22  Pt will complete CLQT and PROM initial therapy session Baseline: Goal status: INITIAL  2.  Pt will accurately sort medications with use of compensations PRN into pill box with occasional mod-A Baseline:  Goal status: INITIAL  3.  Pt will generate visual aid to assist with increased independence in paying bills with A from SLP and s/o Baseline:  Goal status: INITIAL  4.  Pt will verbalize appropriate memory strategy given functional scenario with 80% accuracy   Baseline:  Goal status: INITIAL  5.  Pt and s/o will implement external A for orientation (e.g. calendar) with usual cues from care partner to initiate use  Baseline:  Goal status: INITIAL   LONG TERM GOALS: Target date: 07-26-22  With use of external aid, pt will answer orientation questions with 100% accuracy over 2 sessions Baseline:  Goal status: INITIAL  2.  Pt will verbalize x3 situations in which he successfully employed a memory strategy resulting in subjective satisfaction with ability to recall desired information Baseline:  Goal status: INITIAL  3.  Pt will pay x3 of own bills with supervision from s/o, with use of compensations PRN Baseline:  Goal status: INITIAL  4.  Pt will report improvement via PROM by  dc Baseline:  Goal status: INITIAL  ASSESSMENT:  CLINICAL IMPRESSION: Patient is a 77 y.o. M who was seen today for cognitive linguistic evaluation s/p stroke. Mr. Ganus presents with moderate cognitive impairment in areas of orientation, awareness, memory, executive functioning. Language marked as severe per CLQT, however pt and caregiver insistent that communication not impacted by stroke. Overall reduced verbal output and resistant to speak during evaluation in response to SLP questions/comments. Pt requiring assistance from significant other to manage medication, schedule, and finances. Prior to stroke pt was fully independent. Pt would benefit from skilled ST to facilitate increased independence, enhance QoL, and reduced caregiver burden.    OBJECTIVE IMPAIRMENTS: include attention, memory, awareness, executive functioning, and expressive language. These impairments are limiting patient from managing medications, managing appointments, managing finances, household responsibilities, and ADLs/IADLs. Factors affecting potential to achieve goals and functional outcome are ability to learn/carryover information and cooperation/participation level.. Patient will benefit from skilled SLP services to address above impairments and improve overall function.  REHAB POTENTIAL: Fair (carryover and participation)  PLAN:  SLP FREQUENCY: 2x/week  SLP DURATION: 12 weeks  PLANNED INTERVENTIONS: Cueing hierachy, Cognitive reorganization, Internal/external aids, Functional tasks, SLP instruction and feedback, Compensatory strategies, and Patient/family education    Su Monks, CCC-SLP 06/06/2022, 10:23 AM

## 2022-06-06 NOTE — Therapy (Signed)
OUTPATIENT OCCUPATIONAL THERAPY NEURO TREATMENT  Patient Name: Antonio Adams MRN: [id number] DOB:Oct 05, 1945, 77 y.o., male Today's Date: 06/06/2022  PCP: Dr. Sharlet Salina REFERRING PROVIDER: Dr. Sharlet Salina  END OF SESSION:  OT End of Session - 06/06/22 0933     Visit Number 4    Number of Visits 25    Date for OT Re-Evaluation 07/26/22    Authorization Type [employer] Medicare    Progress Note Due on Visit 10    OT Start Time 0930    OT Stop Time 1015    OT Time Calculation (min) 45 min    Activity Tolerance Patient tolerated treatment well    Behavior During Therapy WFL for tasks assessed/performed             Past Medical History:  Diagnosis Date   ADENOCARCINOMA, PROSTATE, GLEASON GRADE 6 12/21/2009   prostate cancer   ALLERGIC RHINITIS 03/16/2007   Allergy    Anxiety    Arthritis    back    BACK PAIN WITH RADICULOPATHY 11/23/2008   Cancer of kidney ([place])    partial right kidney removed   CARPAL TUNNEL SYNDROME, BILATERAL 03/16/2007   Chronic back pain    Constipation    takes Senokot daily   Depression    occasionally   DIABETES MELLITUS, TYPE II 03/16/2007   takes Januvia and MEtformin daily   GASTROINTESTINAL HEMORRHAGE, HX OF 03/16/2007   GERD 03/16/2007   takes Omeprazole daily   Glaucoma    mild - no eye drops   Hemorrhoids    History of colon polyps    HYPERLIPIDEMIA 03/16/2007   takes Crestor daily   HYPERTENSION 03/16/2007   takes Diltiazem and Lisinopril daily    Kidney cancer, primary, with metastasis from kidney to other site [facility])    LEG CRAMPS 05/22/2007   Nocturia    Overactive bladder    PARESTHESIA 05/21/2007   Proctitis    PTSD (post-traumatic stress disorder)    wakes up may be dreaming of fighting   Rectal bleeding    Dr.Norins has explained its from the Radiation that he has received   Rectal bleeding    Renal cell carcinoma ([place])    Sleep apnea    uses CPAP nightly   Stomach cancer ([place])    Urinary frequency    takes Toviaz daily    Past Surgical History:  Procedure Laterality Date   ADRENALECTOMY Right 02/26/2020   Procedure: OPEN RIGHT RENAL CELL METASTATECTOMY AND  ADRENALECTOMY;  Surgeon: [name], MD;  Location: [facility];  Service: Urology;  Laterality: Right;   [place], 2008   repeat surgery; ESI '08   [place]     bilateral   CERVICAL FUSION     COLONOSCOPY     colonosocpy     ESOPHAGOGASTRODUODENOSCOPY     ED   FLEXIBLE SIGMOIDOSCOPY N/A 01/08/2017   Procedure: FLEXIBLE SIGMOIDOSCOPY;  Surgeon: Irene Shipper, MD;  Location: [place];  Service: Endoscopy;  Laterality: N/A;   HOT HEMOSTASIS N/A 01/08/2017   Procedure: HOT HEMOSTASIS (ARGON PLASMA COAGULATION/BICAP);  Surgeon: Irene Shipper, MD;  Location: [facility];  Service: Endoscopy;  Laterality: N/A;   IR RADIOLOGIST EVAL & MGMT  12/17/2019   IR RADIOLOGIST EVAL & MGMT  07/01/2020   IR RADIOLOGIST EVAL & MGMT  08/25/2020   LUMBAR LAMINECTOMY  10/30/2011   Procedure: MICRODISCECTOMY LUMBAR LAMINECTOMY;  Surgeon: Jessy Oto, MD;  Location: [place];  Service: Orthopedics;  Laterality: Right;  Right L4-5 and L5-S1 Microdiscectomy   LUMBAR LAMINECTOMY  06/17/2012   Procedure: MICRODISCECTOMY LUMBAR LAMINECTOMY;  Surgeon: Jessy Oto, MD;  Location: [place];  Service: Orthopedics;  Laterality: N/A;  Right L5-S1 microdiscectomy   OPEN PARTIAL HEPATECTOMY  N/A 02/26/2020   Procedure: OPEN PARTIAL HEPATECTOMY, CHOLECYSTECTOMY;  Surgeon: Stark Klein, MD;  Location: [facility];  Service: General;  Laterality: N/A;   POLYPECTOMY     PROSTATE SURGERY  march 2012   seed implant   RADIOFREQUENCY ABLATION N/A 07/21/2020   Procedure: CT MICROWAVE ABLATION;  Surgeon: Criselda Peaches, MD;  Location: [facility];  Service: Anesthesiology;  Laterality: N/A;   ROBOTIC ASSITED PARTIAL NEPHRECTOMY Right 09/08/2015   Procedure: XI ROBOTIC ASSITED RIGHT PARTIAL NEPHRECTOMY;  Surgeon: [name], MD;  Location[facility];  Service: Urology;   Laterality: Right;  Clamp on: 1033 Clamp off: 1103 Total Clamp Time: 30 minutes   SHOULDER ARTHROSCOPY WITH SUBACROMIAL DECOMPRESSION Left 01/20/2016   Procedure: LEFT SHOULDER ARTHROSCOPY WITH EXTENSIVE  DEBRIDEMENT, ACROMIOPLASTY;  Surgeon: Ninetta Lights, MD;  Location: [place];  Service: Orthopedics;  Laterality: Left;   SHOULDER SURGERY  LFT   Stress Cardiolite  12/24/2003   negative for ischemia   UPPER ESOPHAGEAL ENDOSCOPIC ULTRASOUND (EUS)  04/18/2016   [facility]   UPPER GASTROINTESTINAL ENDOSCOPY     WOUND EXPLORATION     management of a wound that he sustained in the [place]   Patient Active Problem List   Diagnosis Date Noted   Hx of completed stroke 04/28/2022   Abnormal brain CT 04/07/2022   Encounter for general adult medical examination with abnormal findings 01/23/2022   Anemia due to zinc deficiency 12/20/2021   Low back pain 12/15/2021   Symptomatic anemia 12/15/2021   Cancer related pain 12/15/2021   Chronic heart failure with preserved ejection fraction (HFpEF) ([place]) 08/12/2021   Tinea cruris 08/12/2021   Anemia due to chronic kidney disease 07/20/2021   CKD (chronic kidney disease) stage 5, GFR less than 15 ml/min (HCC) 05/24/2021   BPH (benign prostatic hyperplasia) 05/24/2021   Diabetic renal disease ([place]) 05/20/2021   Dry eye syndrome of bilateral lacrimal glands 05/20/2021   Senile nuclear sclerosis 05/20/2021   Vitamin D deficiency 05/20/2021   Vitreous degeneration, unspecified eye 05/20/2021   Intrinsic eczema 04/18/2021   Atherosclerosis of aorta ([place]) 12/30/2020   Mixed conductive and sensorineural hearing loss of left ear with restricted hearing of right ear 09/01/2020   Tinnitus of both ears 05/20/2020   Metastatic renal cell carcinoma ([place]) 02/26/2020   Diabetic polyneuropathy associated with type 2 diabetes mellitus ([place]) 12/18/2019   Murmur, cardiac 10/08/2018   DDD (degenerative disc disease), cervical 10/08/2018   OAB  (overactive bladder) 04/04/2018   Seasonal allergic rhinitis due to pollen 11/01/2016   Microalbuminuria due to type 2 diabetes mellitus ([place]) 07/27/2016   Varicose veins of both lower extremities 08/06/2015   Varicose veins of lower extremities with ulcer ([place]) 06/16/2015   Internal hemorrhoid, bleeding 08/03/2014   Post traumatic stress disorder (PTSD) 01/29/2014   Spinal stenosis of lumbar region with radiculopathy 10/30/2011    Class: Chronic   Obstructive sleep apnea 11/24/2010   Obesity 11/24/2010   Routine general medical examination at a health care facility 11/24/2010   ADENOCARCINOMA, PROSTATE, GLEASON GRADE 6 12/21/2009   Type 2 diabetes mellitus with diabetic cataract ([place]) 03/16/2007   Essential hypertension 03/16/2007   GERD 03/16/2007    ONSET DATE: 04/07/22  REFERRING DIAG: Diagnosis Z86.73 (ICD-10-CM) - Personal history of transient ischemic attack (TIA), and cerebral infarction without residual deficits  THERAPY DIAG:  Muscle weakness (generalized)  Other lack of coordination  Unsteadiness on feet  Visuospatial deficit  Attention and concentration deficit  Rationale for Evaluation and Treatment: Rehabilitation  SUBJECTIVE:   SUBJECTIVE STATEMENT: No recent falls Pt accompanied by: significant other fiancee [name]  PERTINENT HISTORY: 77 y.o. R-handed male with history of DM2 with diabetic polyneuropathy, OSA on nightly CPAP, PTSD, history of lumbar stenosis with chronic back pain, metastatic renal carcinoma with lymphadenopathy and questionable hepatic involvement noted in 2022, not on chemo due to poor tolerance to the medications, and without significant progression since May 2023, only on surveillance imaging, stage IV, eczema, vitamin D deficiency, CKD stage V refusing HD, BPH, anemia of CKD  Zinc deficiency, chronic heart failure, mild cognitive impairment, s/p left PCA territory acute CVA on 04/07/22   DIAGNOSTIC FINDINGS:  MRI of the brain showing a  left PCA large infarct and punctate right PCA infarct MRA remarkable for left PCA P3 branch occlusion, ICA siphon atherosclerosis with possible tandem significant stenosis of the right siphon  PRECAUTIONS: Fall, hx of CA  WEIGHT BEARING RESTRICTIONS: No  PAIN:  Are you having pain? Yes 1-2/10 lumbar spine today (which is chronic - O.T. not addressing d/t chronic in nature)  FALLS: Has patient fallen in last 6 months? Yes. Number of falls 1  LIVING ENVIRONMENT: Lives with: lives alone, fiancee is there most of the time Lives in: House/apartment Stairs: 1 level stairs to enter Has following equipment at home: Single point cane  PLOF: Independent  PATIENT GOALS: to get right  OBJECTIVE:   HAND DOMINANCE: Right  ADLs: Overall ADLs: fall risk in standing Transfers/ambulation related to [place] Eating: mod I Grooming: mod I UB Dressing: mod I  LB Dressing: supervision, increased time required, therapist recommends pt sits for LB dressing due to fall risk Toileting: mod I using cane Bathing: supervision Tub Shower transfers: min A currently standing in shower, no grab bars, using tub shower does not have seat Equipment: none  IADLs: Shopping: has assistance Light housekeeping: fiancee handles Meal Prep: does not cook Community mobility: ambulates with cane, supervision-minguard, unsteady and walks close to items Medication management: fiancee is helping with meds Financial management: fiancee is assisting  Handwriting: 100% legible  MOBILITY STATUS:  ambulates with minguard with cane , some unsteadiness and walks close to walls and doorways     FUNCTIONAL OUTCOME MEASURES: FOTO: TBA  UPPER EXTREMITY ROM:  remaining AROM is WFLS   Active ROM Right eval Left eval  Shoulder flexion 105 105  Shoulder abduction    Shoulder adduction    Shoulder extension    Shoulder internal rotation    Shoulder external rotation    Elbow flexion    Elbow extension -15  -20  Wrist flexion    Wrist extension    Wrist ulnar deviation    Wrist radial deviation    Wrist pronation    Wrist supination    (Blank rows = not tested)  UPPER EXTREMITY MMT:     MMT Right eval Left eval  Shoulder flexion 4-/5 3+/5  Shoulder abduction    Shoulder adduction    Shoulder extension    Shoulder internal rotation    Shoulder external rotation    Middle trapezius    Lower trapezius    Elbow flexion 4+/5 4+/5  Elbow extension 4/5 4/5  Wrist flexion  Wrist extension    Wrist ulnar deviation    Wrist radial deviation    Wrist pronation    Wrist supination    (Blank rows = not tested)  HAND FUNCTION: Grip strength: Right: 46.9 lbs; Left: 40.3 lbs  COORDINATION: 9 Hole Peg test: Right: 44 sec; Left: 40 sec   SENSATION: Light touch: Impaired  for ring and small finger R hand ulnar distribution, reports this was since CA treatment  EDEMA: n/a    COGNITION: Overall cognitive status: Impaired Decreased short term memory, delayed processing and decreased awareness  VISION:  Baseline vision: Wears glasses all the time   VISION ASSESSMENT: Number cancellation task 1.5 M : 7/80 missed, 91% Visual Fields: per hospital records R homonymous hemianopsia  Vision to be further assessed in a functional context  Patient has difficulty with following activities due to following visual impairments: ambulates close to walls and doorways on R side    OBSERVATIONS: Pt with minimal verbalizations and poor awareness of deficits   TODAY'S TREATMENT:                                                                                                                               Discussed safety w/ shower/tub transfers. Pt shown tub bench and had pt simulate tub transfer w/ bench. Pt/fiancee provided handout and told how/where to purchase or how to go through insurance. Discussed other option of shower chair and strategically placed grab bars as alternative.    Issued memory strategies and reviewed how to implement, especially with medication management.   Attempted simple PVC pipe design - pt required set up, max cueing t/o task. Task ended early d/t time constraints  PATIENT EDUCATION: Education details: use of tub bench, memory strategies Person educated: Patient and Spouse Education method: Explanation, Demonstration, Verbal cues, and Handouts Education comprehension: verbalized understanding, returned demonstration, verbal cues required, and needs further education   HOME EXERCISE PROGRAM: 05/30/22: Visual scanning strategies, coordination HEP 06/01/22: cane HEP   06/06/22: memory strategies   GOALS: Goals reviewed with patient? Yes  SHORT TERM GOALS: Target date: 06/02/22  I with initial HEP : Goal status: MET w/ cues  2.  Pt will perform tub transfers safely mod I using tub bench prn. Baseline: doe not have tub bench yet, currently supervision, min A Goal status: MET  3.  Pt/caregiver will verbalize understanding of compensations for visual deficits  Goal status: IN PROGRESS  4.  Pt will demonstrate improved fine motor coordination  for ADLs with RUE as evidenced by decreasing 9 hole peg test score by 3 secs Baseline: 9 Hole Peg test: Right: 44 sec; Left: 40 sec Goal status: IN PROGRESS  5.   Pt will navigate a busy environment and locate items with 75% accuracy without bumping into items. Baseline: nearly bumps into wall and doorway on right side Goal status: INITIAL  6.  Pt will verbalize understanding of compensations for short term memory deficits.  Baseline:  Goal status: IN PROGRESS   LONG TERM GOALS: Target date: 07/25/21  I with updated HEP Baseline:  Goal status: INITIAL  2.  Pt will navigate a busy environment and locate items with 90% accuracy. Baseline: nearly bumps into wall and doorway on right side Goal status: INITIAL  3.  Pt will perform all basic ADLs mod I with good safety  awareness. Baseline: supervision, min A Goal status: INITIAL  4.  Pt will demonstrate improved fine motor coordination with RUE  for ADLs as evidenced by decreasing 9 hole peg test score by 6 secs form initial eval. Baseline: 9 Hole Peg test: Right: 44 sec; Left: 40 sec Goal status: INITIAL  5.  Pt will demonstrate ability to retrieve lightweight object at 110* shoulder flexion and -10 elbow extension with RUE Baseline: shoulder flexion 105, elbow extension -20 Goal status: INITIAL  6.  Pt will demonstrate ability to retrieve lightweight object at 110* shoulder flexion and -15 elbow extension with LUE Baseline: shoulder flexion 105, elbow extension -20 Goal status: inital  ASSESSMENT:  CLINICAL IMPRESSION: Pt progressing towards goals. Pt limited by cognitive and visual/perceptual deficits.   PERFORMANCE DEFICITS: in functional skills including ADLs, IADLs, coordination, dexterity, proprioception, sensation, tone, ROM, strength, flexibility, Fine motor control, Gross motor control, mobility, balance, endurance, decreased knowledge of precautions, decreased knowledge of use of DME, vision, and UE functional use, cognitive skills including energy/drive, learn, memory, problem solving, safety awareness, thought, and understand, and psychosocial skills including coping strategies, environmental adaptation, habits, interpersonal interactions, and routines and behaviors.   IMPAIRMENTS: are limiting patient from ADLs, IADLs, rest and sleep, play, leisure, and social participation.   CO-MORBIDITIES: may have co-morbidities  that affects occupational performance. Patient will benefit from skilled OT to address above impairments and improve overall function.  MODIFICATION OR ASSISTANCE TO COMPLETE EVALUATION: No modification of tasks or assist necessary to complete an evaluation.  OT OCCUPATIONAL PROFILE AND HISTORY: Detailed assessment: Review of records and additional review of physical,  cognitive, psychosocial history related to current functional performance.  CLINICAL DECISION MAKING: LOW - limited treatment options, no task modification necessary  REHAB POTENTIAL: Good  EVALUATION COMPLEXITY: Low    PLAN:  OT FREQUENCY: 2x/week  OT DURATION: 12 weeks  PLANNED INTERVENTIONS: self care/ADL training, therapeutic exercise, therapeutic activity, neuromuscular re-education, manual therapy, passive range of motion, gait training, balance training, stair training, functional mobility training, aquatic therapy, splinting, paraffin, fluidotherapy, moist heat, cryotherapy, contrast bath, patient/family education, cognitive remediation/compensation, visual/perceptual remediation/compensation, energy conservation, coping strategies training, DME and/or AE instructions, and Re-evaluation  RECOMMENDED OTHER SERVICES: PT, ST  CONSULTED AND AGREED WITH PLAN OF CARE: Patient and family member/caregiver  PLAN FOR NEXT SESSION:  PVC pipe design, 12 pc puzzle    Hans Eden, OT 06/06/2022, 9:34 AM

## 2022-06-07 ENCOUNTER — Ambulatory Visit (HOSPITAL_COMMUNITY)
Admission: RE | Admit: 2022-06-07 | Discharge: 2022-06-07 | Disposition: A | Discharge status: Home / Self Care | Payer: Medicare Other | Source: Ambulatory Visit | Attending: Nephrology | Admitting: Nephrology

## 2022-06-07 VITALS — BP 124/77 | HR 64 | Temp 97.1°F | Resp 17

## 2022-06-07 DIAGNOSIS — C649 Malignant neoplasm of unspecified kidney, except renal pelvis: Secondary | ICD-10-CM | POA: Diagnosis not present

## 2022-06-07 DIAGNOSIS — E875 Hyperkalemia: Secondary | ICD-10-CM | POA: Diagnosis not present

## 2022-06-07 DIAGNOSIS — N185 Chronic kidney disease, stage 5: Secondary | ICD-10-CM | POA: Insufficient documentation

## 2022-06-07 DIAGNOSIS — E785 Hyperlipidemia, unspecified: Secondary | ICD-10-CM | POA: Diagnosis not present

## 2022-06-07 DIAGNOSIS — N2581 Secondary hyperparathyroidism of renal origin: Secondary | ICD-10-CM | POA: Diagnosis not present

## 2022-06-07 DIAGNOSIS — I120 Hypertensive chronic kidney disease with stage 5 chronic kidney disease or end stage renal disease: Secondary | ICD-10-CM | POA: Diagnosis not present

## 2022-06-07 DIAGNOSIS — E1122 Type 2 diabetes mellitus with diabetic chronic kidney disease: Secondary | ICD-10-CM | POA: Diagnosis not present

## 2022-06-07 DIAGNOSIS — D631 Anemia in chronic kidney disease: Secondary | ICD-10-CM | POA: Diagnosis not present

## 2022-06-07 LAB — IRON AND TIBC
Iron: 75 ug/dL (ref 45–182)
Saturation Ratios: 26 % (ref 17.9–39.5)
TIBC: 294 ug/dL (ref 250–450)
UIBC: 219 ug/dL

## 2022-06-07 LAB — FERRITIN: Ferritin: 706 ng/mL — ABNORMAL HIGH (ref 24–336)

## 2022-06-07 LAB — POCT HEMOGLOBIN-HEMACUE: Hemoglobin: 10 g/dL — ABNORMAL LOW (ref 13.0–17.0)

## 2022-06-07 MED ORDER — EPOETIN ALFA-EPBX 40000 UNIT/ML IJ SOLN: 30000.0000 [IU] | INTRAMUSCULAR | Status: DC

## 2022-06-07 MED ORDER — EPOETIN ALFA-EPBX 40000 UNIT/ML IJ SOLN
INTRAMUSCULAR | Status: AC
  Administered 2022-06-07: 30000 [IU] via SUBCUTANEOUS
  Filled 2022-06-07: qty 1

## 2022-06-07 NOTE — Therapy (Signed)
OUTPATIENT OCCUPATIONAL THERAPY NEURO TREATMENT  Patient Name: Antonio Adams MRN: [id number] DOB:[birth year], 77 y.o., male Today's Date: 06/08/2022  PCP: Dr. Sharlet Salina REFERRING PROVIDER: Dr. Sharlet Salina  END OF SESSION:  OT End of Session - 06/08/22 1109     Visit Number 5    Number of Visits 25    Date for OT Re-Evaluation 07/26/22    Authorization Type [employer]    Authorization - Visit Number 5    Authorization - Number of Visits 10    Progress Note Due on Visit 10    OT Start Time 1104    OT Stop Time 1145    OT Time Calculation (min) 41 min    Activity Tolerance Patient tolerated treatment well    Behavior During Therapy WFL for tasks assessed/performed              Past Medical History:  Diagnosis Date   ADENOCARCINOMA, PROSTATE, GLEASON GRADE 6 12/21/2009   prostate cancer   ALLERGIC RHINITIS 03/16/2007   Allergy    Anxiety    Arthritis    back    BACK PAIN WITH RADICULOPATHY 11/23/2008   Cancer of kidney ([place])    partial right kidney removed   CARPAL TUNNEL SYNDROME, BILATERAL 03/16/2007   Chronic back pain    Constipation    takes Senokot daily   Depression    occasionally   DIABETES MELLITUS, TYPE II 03/16/2007   takes Januvia and MEtformin daily   GASTROINTESTINAL HEMORRHAGE, HX OF 03/16/2007   GERD 03/16/2007   takes Omeprazole daily   Glaucoma    mild - no eye drops   Hemorrhoids    History of colon polyps    HYPERLIPIDEMIA 03/16/2007   takes Crestor daily   HYPERTENSION 03/16/2007   takes Diltiazem and Lisinopril daily    Kidney cancer, primary, with metastasis from kidney to other site [facility])    LEG CRAMPS 05/22/2007   Nocturia    Overactive bladder    PARESTHESIA 05/21/2007   Proctitis    PTSD (post-traumatic stress disorder)    wakes up may be dreaming of fighting   Rectal bleeding    Dr.Norins has explained its from the Radiation that he has received   Rectal bleeding    Renal cell carcinoma ([place])    Sleep apnea    uses CPAP  nightly   Stomach cancer ([place])    Urinary frequency    takes Toviaz daily   Past Surgical History:  Procedure Laterality Date   ADRENALECTOMY Right 02/26/2020   Procedure: OPEN RIGHT RENAL CELL METASTATECTOMY AND  ADRENALECTOMY;  Surgeon: [name], MD;  Location: [facility];  Service: Urology;  Laterality: Right;   [place], 2008   repeat surgery; ESI '08   [place]     bilateral   CERVICAL FUSION     COLONOSCOPY     colonosocpy     ESOPHAGOGASTRODUODENOSCOPY     ED   FLEXIBLE SIGMOIDOSCOPY N/A 01/08/2017   Procedure: FLEXIBLE SIGMOIDOSCOPY;  Surgeon: Irene Shipper, MD;  Location: [place];  Service: Endoscopy;  Laterality: N/A;   HOT HEMOSTASIS N/A 01/08/2017   Procedure: HOT HEMOSTASIS (ARGON PLASMA COAGULATION/BICAP);  Surgeon: Irene Shipper, MD;  [facility] ENDOSCOPY;  Service: Endoscopy;  Laterality: N/A;   IR RADIOLOGIST EVAL & MGMT  12/17/2019   IR RADIOLOGIST EVAL & MGMT  07/01/2020   IR RADIOLOGIST EVAL & MGMT  08/25/2020   LUMBAR LAMINECTOMY  10/30/2011  Procedure: MICRODISCECTOMY LUMBAR LAMINECTOMY;  Surgeon: Jessy Oto, MD;  Location: [place];  Service: Orthopedics;  Laterality: Right;  Right L4-5 and L5-S1 Microdiscectomy   LUMBAR LAMINECTOMY  06/17/2012   Procedure: MICRODISCECTOMY LUMBAR LAMINECTOMY;  Surgeon: Jessy Oto, MD;  Location: [place];  Service: Orthopedics;  Laterality: N/A;  Right L5-S1 microdiscectomy   OPEN PARTIAL HEPATECTOMY  N/A 02/26/2020   Procedure: OPEN PARTIAL HEPATECTOMY, CHOLECYSTECTOMY;  Surgeon: Stark Klein, MD;  Location: [facility];  Service: General;  Laterality: N/A;   POLYPECTOMY     PROSTATE SURGERY  march 2012   seed implant   RADIOFREQUENCY ABLATION N/A 07/21/2020   Procedure: CT MICROWAVE ABLATION;  Surgeon: Criselda Peaches, MD;  Location: [facility];  Service: Anesthesiology;  Laterality: N/A;   ROBOTIC ASSITED PARTIAL NEPHRECTOMY Right 09/08/2015   Procedure: XI ROBOTIC ASSITED RIGHT PARTIAL NEPHRECTOMY;   Surgeon: [name], MD;  Location: [facility];  Service: Urology;  Laterality: Right;  Clamp on: 1033 Clamp off: 1103 Total Clamp Time: 30 minutes   SHOULDER ARTHROSCOPY WITH SUBACROMIAL DECOMPRESSION Left 01/20/2016   Procedure: LEFT SHOULDER ARTHROSCOPY WITH EXTENSIVE  DEBRIDEMENT, ACROMIOPLASTY;  Surgeon: Ninetta Lights, MD;  Location: [place];  Service: Orthopedics;  Laterality: Left;   SHOULDER SURGERY  LFT   Stress Cardiolite  12/24/2003   negative for ischemia   UPPER ESOPHAGEAL ENDOSCOPIC ULTRASOUND (EUS)  04/18/2016   [facility]   UPPER GASTROINTESTINAL ENDOSCOPY     WOUND EXPLORATION     management of a wound that he sustained in the [place]   Patient Active Problem List   Diagnosis Date Noted   Hx of completed stroke 04/28/2022   Abnormal brain CT 04/07/2022   Encounter for general adult medical examination with abnormal findings 01/23/2022   Anemia due to zinc deficiency 12/20/2021   Low back pain 12/15/2021   Symptomatic anemia 12/15/2021   Cancer related pain 12/15/2021   Chronic heart failure with preserved ejection fraction (HFpEF) ([place]) 08/12/2021   Tinea cruris 08/12/2021   Anemia due to chronic kidney disease 07/20/2021   CKD (chronic kidney disease) stage 5, GFR less than 15 ml/min (HCC) 05/24/2021   BPH (benign prostatic hyperplasia) 05/24/2021   Diabetic renal disease ([place]) 05/20/2021   Dry eye syndrome of bilateral lacrimal glands 05/20/2021   Senile nuclear sclerosis 05/20/2021   Vitamin D deficiency 05/20/2021   Vitreous degeneration, unspecified eye 05/20/2021   Intrinsic eczema 04/18/2021   Atherosclerosis of aorta ([place]) 12/30/2020   Mixed conductive and sensorineural hearing loss of left ear with restricted hearing of right ear 09/01/2020   Tinnitus of both ears 05/20/2020   Metastatic renal cell carcinoma ([place]) 02/26/2020   Diabetic polyneuropathy associated with type 2 diabetes mellitus ([place]) 12/18/2019   Murmur, cardiac  10/08/2018   DDD (degenerative disc disease), cervical 10/08/2018   OAB (overactive bladder) 04/04/2018   Seasonal allergic rhinitis due to pollen 11/01/2016   Microalbuminuria due to type 2 diabetes mellitus ([place]) 07/27/2016   Varicose veins of both lower extremities 08/06/2015   Varicose veins of lower extremities with ulcer ([place]) 06/16/2015   Internal hemorrhoid, bleeding 08/03/2014   Post traumatic stress disorder (PTSD) 01/29/2014   Spinal stenosis of lumbar region with radiculopathy 10/30/2011    Class: Chronic   Obstructive sleep apnea 11/24/2010   Obesity 11/24/2010   Routine general medical examination at a health care facility 11/24/2010   ADENOCARCINOMA, PROSTATE, GLEASON GRADE 6 12/21/2009   Type 2 diabetes mellitus with diabetic  cataract ([place]) 03/16/2007   Essential hypertension 03/16/2007   GERD 03/16/2007    ONSET DATE: 04/07/22  REFERRING DIAG: Diagnosis Z86.73 (ICD-10-CM) - Personal history of transient ischemic attack (TIA), and cerebral infarction without residual deficits  THERAPY DIAG:  Visuospatial deficit  Attention and concentration deficit  Other lack of coordination  Muscle weakness (generalized)  Frontal lobe and executive function deficit  Unsteadiness on feet  Rationale for Evaluation and Treatment: Rehabilitation  SUBJECTIVE:   SUBJECTIVE STATEMENT: It just took me forever (re:  PVC design)   Pt accompanied by: significant other fiancee [name]  PERTINENT HISTORY: 78 y.o. R-handed male with history of DM2 with diabetic polyneuropathy, OSA on nightly CPAP, PTSD, history of lumbar stenosis with chronic back pain, metastatic renal carcinoma with lymphadenopathy and questionable hepatic involvement noted in 2022, not on chemo due to poor tolerance to the medications, and without significant progression since May 2023, only on surveillance imaging, stage IV, eczema, vitamin D deficiency, CKD stage V refusing HD, BPH, anemia of CKD  Zinc deficiency,  chronic heart failure, mild cognitive impairment, s/p left PCA territory acute CVA on 04/07/22   DIAGNOSTIC FINDINGS:  MRI of the brain showing a left PCA large infarct and punctate right PCA infarct MRA remarkable for left PCA P3 branch occlusion, ICA siphon atherosclerosis with possible tandem significant stenosis of the right siphon  PRECAUTIONS: Fall, hx of CA  WEIGHT BEARING RESTRICTIONS: No  PAIN:  Are you having pain? No  FALLS: Has patient fallen in last 6 months? Yes. Number of falls 1  LIVING ENVIRONMENT: Lives with: lives alone, fiancee is there most of the time Lives in: House/apartment Stairs: 1 level stairs to enter Has following equipment at home: Single point cane  PLOF: Independent  PATIENT GOALS: to get right  OBJECTIVE:   HAND DOMINANCE: Right  ADLs: Overall ADLs: fall risk in standing Transfers/ambulation related to [place] Eating: mod I Grooming: mod I UB Dressing: mod I  LB Dressing: supervision, increased time required, therapist recommends pt sits for LB dressing due to fall risk Toileting: mod I using cane Bathing: supervision Tub Shower transfers: min A currently standing in shower, no grab bars, using tub shower does not have seat Equipment: none  IADLs: Shopping: has assistance Light housekeeping: fiancee handles Meal Prep: does not cook Community mobility: ambulates with cane, supervision-minguard, unsteady and walks close to items Medication management: fiancee is helping with meds Financial management: fiancee is assisting  Handwriting: 100% legible  MOBILITY STATUS:  ambulates with minguard with cane , some unsteadiness and walks close to walls and doorways     FUNCTIONAL OUTCOME MEASURES: FOTO: TBA  UPPER EXTREMITY ROM:  remaining AROM is WFLS   Active ROM Right eval Left eval  Shoulder flexion 105 105  Shoulder abduction    Shoulder adduction    Shoulder extension    Shoulder internal rotation    Shoulder  external rotation    Elbow flexion    Elbow extension -15 -20  Wrist flexion    Wrist extension    Wrist ulnar deviation    Wrist radial deviation    Wrist pronation    Wrist supination    (Blank rows = not tested)  UPPER EXTREMITY MMT:     MMT Right eval Left eval  Shoulder flexion 4-/5 3+/5  Shoulder abduction    Shoulder adduction    Shoulder extension    Shoulder internal rotation    Shoulder external rotation    Middle trapezius  Lower trapezius    Elbow flexion 4+/5 4+/5  Elbow extension 4/5 4/5  Wrist flexion    Wrist extension    Wrist ulnar deviation    Wrist radial deviation    Wrist pronation    Wrist supination    (Blank rows = not tested)  HAND FUNCTION: Grip strength: Right: 46.9 lbs; Left: 40.3 lbs  COORDINATION: 9 Hole Peg test: Right: 44 sec; Left: 40 sec   SENSATION: Light touch: Impaired  for ring and small finger R hand ulnar distribution, reports this was since CA treatment  EDEMA: n/a    COGNITION: Overall cognitive status: Impaired Decreased short term memory, delayed processing and decreased awareness  VISION:  Baseline vision: Wears glasses all the time   VISION ASSESSMENT: Number cancellation task 1.5 M : 7/80 missed, 91% Visual Fields: per hospital records R homonymous hemianopsia  Vision to be further assessed in a functional context  Patient has difficulty with following activities due to following visual impairments: ambulates close to walls and doorways on R side    OBSERVATIONS: Pt with minimal verbalizations and poor awareness of deficits   TODAY'S TREATMENT:                                                                                                                               Copying simple PVC design x2 with mod cueing for set-up and mod cueing for problem solving/visual scanning.  Completing 12-piece puzzle with min cueing for problem-solving.   PATIENT EDUCATION: Education details: use of simple  puzzles and word searches at home Person educated: Patient and Spouse Education method: Explanation Education comprehension: verbalized understanding    HOME EXERCISE PROGRAM: 05/30/22: Visual scanning strategies, coordination HEP 06/01/22: cane HEP   06/06/22: memory strategies   GOALS: Goals reviewed with patient? Yes  SHORT TERM GOALS: Target date: 06/02/22  I with initial HEP : Goal status: MET w/ cues  2.  Pt will perform tub transfers safely mod I using tub bench prn. Baseline: doe not have tub bench yet, currently supervision, min A Goal status: MET  3.  Pt/caregiver will verbalize understanding of compensations for visual deficits  Goal status: IN PROGRESS  4.  Pt will demonstrate improved fine motor coordination  for ADLs with RUE as evidenced by decreasing 9 hole peg test score by 3 secs Baseline: 9 Hole Peg test: Right: 44 sec; Left: 40 sec Goal status: IN PROGRESS  5.   Pt will navigate a busy environment and locate items with 75% accuracy without bumping into items. Baseline: nearly bumps into wall and doorway on right side Goal status: INITIAL  6.  Pt will verbalize understanding of compensations for short term memory deficits. Baseline:  Goal status: IN PROGRESS   LONG TERM GOALS: Target date: 07/25/21  I with updated HEP Baseline:  Goal status: INITIAL  2.  Pt will navigate a busy environment and locate items with 90% accuracy. Baseline: nearly bumps into  wall and doorway on right side Goal status: INITIAL  3.  Pt will perform all basic ADLs mod I with good safety awareness. Baseline: supervision, min A Goal status: INITIAL  4.  Pt will demonstrate improved fine motor coordination with RUE  for ADLs as evidenced by decreasing 9 hole peg test score by 6 secs form initial eval. Baseline: 9 Hole Peg test: Right: 44 sec; Left: 40 sec Goal status: INITIAL  5.  Pt will demonstrate ability to retrieve lightweight object at 110* shoulder flexion and -10  elbow extension with RUE Baseline: shoulder flexion 105, elbow extension -20 Goal status: INITIAL  6.  Pt will demonstrate ability to retrieve lightweight object at 110* shoulder flexion and -15 elbow extension with LUE Baseline: shoulder flexion 105, elbow extension -20 Goal status: inital  ASSESSMENT:  CLINICAL IMPRESSION: Pt progressing towards goals. Pt limited by cognitive and visual/perceptual deficits.  Pt benefits from questioning cueing.    PERFORMANCE DEFICITS: in functional skills including ADLs, IADLs, coordination, dexterity, proprioception, sensation, tone, ROM, strength, flexibility, Fine motor control, Gross motor control, mobility, balance, endurance, decreased knowledge of precautions, decreased knowledge of use of DME, vision, and UE functional use, cognitive skills including energy/drive, learn, memory, problem solving, safety awareness, thought, and understand, and psychosocial skills including coping strategies, environmental adaptation, habits, interpersonal interactions, and routines and behaviors.   IMPAIRMENTS: are limiting patient from ADLs, IADLs, rest and sleep, play, leisure, and social participation.   CO-MORBIDITIES: may have co-morbidities  that affects occupational performance. Patient will benefit from skilled OT to address above impairments and improve overall function.  MODIFICATION OR ASSISTANCE TO COMPLETE EVALUATION: No modification of tasks or assist necessary to complete an evaluation.  OT OCCUPATIONAL PROFILE AND HISTORY: Detailed assessment: Review of records and additional review of physical, cognitive, psychosocial history related to current functional performance.  CLINICAL DECISION MAKING: LOW - limited treatment options, no task modification necessary  REHAB POTENTIAL: Good  EVALUATION COMPLEXITY: Low   PLAN:  OT FREQUENCY: 2x/week  OT DURATION: 12 weeks  PLANNED INTERVENTIONS: self care/ADL training, therapeutic exercise,  therapeutic activity, neuromuscular re-education, manual therapy, passive range of motion, gait training, balance training, stair training, functional mobility training, aquatic therapy, splinting, paraffin, fluidotherapy, moist heat, cryotherapy, contrast bath, patient/family education, cognitive remediation/compensation, visual/perceptual remediation/compensation, energy conservation, coping strategies training, DME and/or AE instructions, and Re-evaluation  RECOMMENDED OTHER SERVICES: PT, ST  CONSULTED AND AGREED WITH PLAN OF CARE: Patient and family member/caregiver  PLAN FOR NEXT SESSION:  check STGs, ?simple environmental scanning    Elise Gladden, OTR/L 06/08/2022, 11:10 AM

## 2022-06-08 ENCOUNTER — Encounter: Payer: Self-pay | Admitting: Occupational Therapy

## 2022-06-08 ENCOUNTER — Encounter: Payer: Self-pay | Admitting: Physical Therapy

## 2022-06-08 ENCOUNTER — Ambulatory Visit: Payer: Medicare Other | Admitting: Physical Therapy

## 2022-06-08 ENCOUNTER — Ambulatory Visit: Payer: Medicare Other | Admitting: Speech Pathology

## 2022-06-08 ENCOUNTER — Ambulatory Visit: Payer: Medicare Other | Admitting: Occupational Therapy

## 2022-06-08 VITALS — BP 151/77 | HR 64

## 2022-06-08 DIAGNOSIS — R2681 Unsteadiness on feet: Secondary | ICD-10-CM

## 2022-06-08 DIAGNOSIS — R2689 Other abnormalities of gait and mobility: Secondary | ICD-10-CM

## 2022-06-08 DIAGNOSIS — R278 Other lack of coordination: Secondary | ICD-10-CM | POA: Diagnosis not present

## 2022-06-08 DIAGNOSIS — R41840 Attention and concentration deficit: Secondary | ICD-10-CM

## 2022-06-08 DIAGNOSIS — M6281 Muscle weakness (generalized): Secondary | ICD-10-CM

## 2022-06-08 DIAGNOSIS — R41842 Visuospatial deficit: Secondary | ICD-10-CM

## 2022-06-08 DIAGNOSIS — R41841 Cognitive communication deficit: Secondary | ICD-10-CM

## 2022-06-08 DIAGNOSIS — R41844 Frontal lobe and executive function deficit: Secondary | ICD-10-CM

## 2022-06-08 NOTE — Therapy (Signed)
OUTPATIENT PHYSICAL THERAPY NEURO TREATMENT   Patient Name: Antonio Adams MRN: [id number] DOB:05-Nov-1945, 77 y.o., male Today's Date: 06/08/2022   PCP: Scarlette Calico, MD REFERRING PROVIDER: Pricilla Holm, MD  END OF SESSION:  PT End of Session - 06/08/22 1022     Visit Number 5    Number of Visits 17    Date for PT Re-Evaluation 06/26/22    Authorization Type [employer] Medicare    PT Start Time 1019   handoff from [place]   PT Stop Time 1100    PT Time Calculation (min) 41 min    Equipment Utilized During Treatment Gait belt    Activity Tolerance Patient tolerated treatment well    Behavior During Therapy WFL for tasks assessed/performed             Past Medical History:  Diagnosis Date   ADENOCARCINOMA, PROSTATE, GLEASON GRADE 6 12/21/2009   prostate cancer   ALLERGIC RHINITIS 03/16/2007   Allergy    Anxiety    Arthritis    back    BACK PAIN WITH RADICULOPATHY 11/23/2008   Cancer of kidney ([place])    partial right kidney removed   CARPAL TUNNEL SYNDROME, BILATERAL 03/16/2007   Chronic back pain    Constipation    takes Senokot daily   Depression    occasionally   DIABETES MELLITUS, TYPE II 03/16/2007   takes Januvia and MEtformin daily   GASTROINTESTINAL HEMORRHAGE, HX OF 03/16/2007   GERD 03/16/2007   takes Omeprazole daily   Glaucoma    mild - no eye drops   Hemorrhoids    History of colon polyps    HYPERLIPIDEMIA 03/16/2007   takes Crestor daily   HYPERTENSION 03/16/2007   takes Diltiazem and Lisinopril daily    Kidney cancer, primary, with metastasis from kidney to other site [facility])    LEG CRAMPS 05/22/2007   Nocturia    Overactive bladder    PARESTHESIA 05/21/2007   Proctitis    PTSD (post-traumatic stress disorder)    wakes up may be dreaming of fighting   Rectal bleeding    Dr.Norins has explained its from the Radiation that he has received   Rectal bleeding    Renal cell carcinoma ([place])    Sleep apnea    uses CPAP nightly   Stomach cancer ([place])     Urinary frequency    takes Toviaz daily   Past Surgical History:  Procedure Laterality Date   ADRENALECTOMY Right 02/26/2020   Procedure: OPEN RIGHT RENAL CELL METASTATECTOMY AND  ADRENALECTOMY;  Surgeon: [name], MD;  Location: [facility];  Service: Urology;  Laterality: Right;   [place], 2008   repeat surgery; ESI '08   [place]     bilateral   CERVICAL FUSION     COLONOSCOPY     colonosocpy     ESOPHAGOGASTRODUODENOSCOPY     ED   FLEXIBLE SIGMOIDOSCOPY N/A 01/08/2017   Procedure: FLEXIBLE SIGMOIDOSCOPY;  Surgeon: Irene Shipper, MD;  Location: [place];  Service: Endoscopy;  Laterality: N/A;   HOT HEMOSTASIS N/A 01/08/2017   Procedure: HOT HEMOSTASIS (ARGON PLASMA COAGULATION/BICAP);  Surgeon: Irene Shipper, MD;  [facility];  Service: Endoscopy;  Laterality: N/A;   IR RADIOLOGIST EVAL & MGMT  12/17/2019   IR RADIOLOGIST EVAL & MGMT  07/01/2020   IR RADIOLOGIST EVAL & MGMT  08/25/2020   LUMBAR LAMINECTOMY  10/30/2011   Procedure: MICRODISCECTOMY LUMBAR LAMINECTOMY;  Surgeon: Jessy Oto, MD;  Location: [place];  Service: Orthopedics;  Laterality: Right;  Right L4-5 and L5-S1 Microdiscectomy   LUMBAR LAMINECTOMY  06/17/2012   Procedure: MICRODISCECTOMY LUMBAR LAMINECTOMY;  Surgeon: Jessy Oto, MD;  Location: [place];  Service: Orthopedics;  Laterality: N/A;  Right L5-S1 microdiscectomy   OPEN PARTIAL HEPATECTOMY  N/A 02/26/2020   Procedure: OPEN PARTIAL HEPATECTOMY, CHOLECYSTECTOMY;  Surgeon: Stark Klein, MD;  Location: [facility];  Service: General;  Laterality: N/A;   POLYPECTOMY     PROSTATE SURGERY  march 2012   seed implant   RADIOFREQUENCY ABLATION N/A 07/21/2020   Procedure: CT MICROWAVE ABLATION;  Surgeon: Criselda Peaches, MD;  Location: [facility];  Service: Anesthesiology;  Laterality: N/A;   ROBOTIC ASSITED PARTIAL NEPHRECTOMY Right 09/08/2015   Procedure: XI ROBOTIC ASSITED RIGHT PARTIAL NEPHRECTOMY;  Surgeon: [name], MD;   Location: [facility];  Service: Urology;  Laterality: Right;  Clamp on: 1033 Clamp off: 1103 Total Clamp Time: 30 minutes   SHOULDER ARTHROSCOPY WITH SUBACROMIAL DECOMPRESSION Left 01/20/2016   Procedure: LEFT SHOULDER ARTHROSCOPY WITH EXTENSIVE  DEBRIDEMENT, ACROMIOPLASTY;  Surgeon: Ninetta Lights, MD;  Location: [place];  Service: Orthopedics;  Laterality: Left;   SHOULDER SURGERY  LFT   Stress Cardiolite  12/24/2003   negative for ischemia   UPPER ESOPHAGEAL ENDOSCOPIC ULTRASOUND (EUS)  04/18/2016   [facility]   UPPER GASTROINTESTINAL ENDOSCOPY     WOUND EXPLORATION     management of a wound that he sustained in the [place]   Patient Active Problem List   Diagnosis Date Noted   Hx of completed stroke 04/28/2022   Abnormal brain CT 04/07/2022   Encounter for general adult medical examination with abnormal findings 01/23/2022   Anemia due to zinc deficiency 12/20/2021   Low back pain 12/15/2021   Symptomatic anemia 12/15/2021   Cancer related pain 12/15/2021   Chronic heart failure with preserved ejection fraction (HFpEF) ([place]) 08/12/2021   Tinea cruris 08/12/2021   Anemia due to chronic kidney disease 07/20/2021   CKD (chronic kidney disease) stage 5, GFR less than 15 ml/min (HCC) 05/24/2021   BPH (benign prostatic hyperplasia) 05/24/2021   Diabetic renal disease ([place]) 05/20/2021   Dry eye syndrome of bilateral lacrimal glands 05/20/2021   Senile nuclear sclerosis 05/20/2021   Vitamin D deficiency 05/20/2021   Vitreous degeneration, unspecified eye 05/20/2021   Intrinsic eczema 04/18/2021   Atherosclerosis of aorta ([place]) 12/30/2020   Mixed conductive and sensorineural hearing loss of left ear with restricted hearing of right ear 09/01/2020   Tinnitus of both ears 05/20/2020   Metastatic renal cell carcinoma ([place]) 02/26/2020   Diabetic polyneuropathy associated with type 2 diabetes mellitus ([place]) 12/18/2019   Murmur, cardiac 10/08/2018   DDD (degenerative disc  disease), cervical 10/08/2018   OAB (overactive bladder) 04/04/2018   Seasonal allergic rhinitis due to pollen 11/01/2016   Microalbuminuria due to type 2 diabetes mellitus ([place]) 07/27/2016   Varicose veins of both lower extremities 08/06/2015   Varicose veins of lower extremities with ulcer ([place]) 06/16/2015   Internal hemorrhoid, bleeding 08/03/2014   Post traumatic stress disorder (PTSD) 01/29/2014   Spinal stenosis of lumbar region with radiculopathy 10/30/2011    Class: Chronic   Obstructive sleep apnea 11/24/2010   Obesity 11/24/2010   Routine general medical examination at a health care facility 11/24/2010   ADENOCARCINOMA, PROSTATE, GLEASON GRADE 6 12/21/2009   Type 2 diabetes mellitus with diabetic cataract ([place]) 03/16/2007   Essential hypertension 03/16/2007   GERD  03/16/2007    ONSET DATE: 04/07/22  REFERRING DIAG: N17.00 (ICD-10-CM) - Hx of completed stroke  THERAPY DIAG:  Muscle weakness (generalized)  Other lack of coordination  Unsteadiness on feet  Other abnormalities of gait and mobility  Rationale for Evaluation and Treatment: Rehabilitation  SUBJECTIVE:                                                                                                                                                                                             SUBJECTIVE STATEMENT: Pt has been doing some HEP.  He denies falls or pain today.  Starts a new medicine tonight, Mirtazapine, for his appetite. Pt accompanied by: significant other [place])  PERTINENT HISTORY: JEYDEN COFFELT is a 77 y.o. R-handed male with history of DM2 with diabetic polyneuropathy, OSA on nightly CPAP, PTSD, history of lumbar stenosis with chronic back pain, metastatic renal carcinoma with lymphadenopathy and questionable hepatic involvement noted in 2022, not on chemo due to poor tolerance to the medications, and without significant progression since May 2023, only on surveillance imaging, stage IV,  eczema, vitamin D deficiency, CKD stage V refusing HD, BPH, anemia of CKD  Zinc deficiency, chronic heart failure, mild cognitive impairment, presenting for evaluation of left PCA territory acute CVA.  PAIN:  Are you having pain? No  PRECAUTIONS: Fall  WEIGHT BEARING RESTRICTIONS: No  OBJECTIVE:  TODAY'S TREATMENT:    LUE in sitting prior to session:                                                                                                                            Today's Vitals   06/08/22 1019  BP: (!) 151/77  Pulse: 64  -Standing on incline eyes open 2x30 sec > eyes closed 2x30 sec > alt semi tandem eyes open 2x30 sec > semi-tandem eyes closed x30 sec -Up and down ramp using SPC x1 at SBA level cued to appropriately sequence cane for safety and to prevent crouched gait -LUE support on counter w/ progressed depth of alt lunge to blue dynadisc x20 -Tilt board attempted w/  unilateral UE support regressed to BUE for anterior to posterior tilting, max cues and facilitation for ankle strategy vs only hip strategy > no UE support level holds x77mnutes > Bil fingertip support mini squats on tilt board x10 > standing level no UE support perturbations, pt has severe anterior LOB when perturbations to posterior hips x3 -30lb bilateral leg press w/ therapist facilitating form and assisting pt in maintaining feet on platform x15 reps, inc cuing to prevent hyperextension w/ notable difficulty initiating knee flexion this visit compared to last; attempted toe presses at end range w/ pt unable to prevent feet sliding off platform during motion so activity d/c'd -Hamstring curls in sitting w/ red theraband x10 each LE  PATIENT EDUCATION: Education details: Continue HEP and focus on LLE clearance during walking with cane at home for improved mechanics. Person educated: Patient and Spouse Education method: Explanation, Demonstration, and Verbal cues Education comprehension: verbalized understanding  and needs further education  HOME EXERCISE PROGRAM:   Clinical References  HIP: Extension, Bridging Bilateral    Place feet on surface. Tighten glutes, raise hips up. 12 reps per set, 1 sets per day, 4-5 days per week   Copyright  VHI. All rights reserved.    HIP: Extension / KNEE: Flexion, Standing    Bend knee, squeeze glutes. Move leg backward. 10 reps per set, 1 sets per day, 4-5 days per week Hold onto a support.  Copyright  VHI. All rights reserved.    Band Walk: Side Stepping    Tie band around legs, just above knees. Step 10 feet to one side, then step back to start. Repeat 40 feet per session. Note: Small towel between band and skin eases rubbing.   http://plyo.exer.us/76   Copyright  VHI. All rights reserved.    Marching In-Place    Standing straight, alternate bringing knees toward trunk. Arms swing alternately. Do 2 sets. Do 20 times per day.  Copyright  VHI. All rights reserved.    Sit to Stand / Stand to Sit / Transfers    Sit on edge of a solid chair with arms, feet flat on floor. Lean forward over feet and stand up with hands on chair arms. Sit down slowly with hands on chair arms. Repeat 10 times per session. Do 1-2 sessions per day.  Copyright  VHI. All rights reserved.    Access Code: [email or web address]: [email or web address] Date: 06/01/2022 Prepared by: MElease Etienne Exercises - Standing Single Leg Stance with Counter Support  - 1 x daily - 4 x weekly - 1 sets - 2 reps - 30 seconds hold - Standing Tandem Balance with Counter Support  - 1 x daily - 4 x weekly - 1 sets - 2 reps - 30 seconds hold - Corner Balance Feet Together With Eyes Closed  - 1 x daily - 5 x weekly - 1 sets - 2-3 reps - 30 seconds hold  GOALS: Goals reviewed with patient? Yes  SHORT TERM GOALS: Target date: 05/26/22  Pt will be IND with initial HEP in order to indicate improved functional mobility and dec fall risk. Baseline:  Pt states  compliance and no issues (1/18) Goal status: MET  2.  Pt will improve gait speed to >/=2.62 ft/sec w/ SPC in order to indicate dec fall risk.  Baseline: 2.00 ft/sec; 1.90 ft/sec (1/18) Goal status: NOT MET  3.  Pt will ambulate 574 without at mod I level with only minimal Trendelenburg in order to indicate more independent household ambulation.  Baseline: ongoing compensated right trendelenburg x200' SBA (1/18) Goal status: IN PROGRESS  4.  Pt will improve FGA to >/=18/30 in order to indicate dec fall risk.   Baseline: 12/30; 11/30 (1/23) Goal status: NOT MET  5.  Pt will improve 5TSS to </=30 secs without UE support only in order to indicate dec fall risk and improved functional strength.   Baseline: approx 37 secs with hands on lap and mild anterior LOB; 26.59 sec no UE support or LOB (1/18) Goal status: MET  6.  Pt will negotiate up/down 4 steps with single rail/cane, up/down ramp and curb and ambulate x 500' outdoors over unlevel paved surfaces at S level in order to indicate improved community mobility.   Baseline: 4 stairs right rail, curb and ramp SBA (1/18); pt declines to ambulate outside due to cold weather (1/23) Goal status: IN PROGRESS  LONG TERM GOALS: Target date: 06/26/22  Pt will be IND with final HEP in order to indicate improved functional mobility and dec fall risk. Baseline:  Goal status: INITIAL  2.  Pt will improve FGA to >/=23/30 in order to indicate dec fall risk.   Baseline:  Goal status: INITIAL  3.  Pt will improve 5TSS to </=20 secs without UE support only in order to indicate dec fall risk and improved functional strength.   Baseline:  Goal status: INITIAL  4.  Pt will improve gait speed to >/=2.62 ft/sec w/o AD in order to indicate dec fall risk.  Baseline:  Goal status: INITIAL  5.  Pt will ambulate 230' without AD at mod I level in order to indicate more independent household ambulation.   Baseline:  Goal status: INITIAL  6.  Pt  will negotiate up/down 4 steps with single rail, up/down ramp and curb and ambulate x 500' outdoors over unlevel paved surfaces without device at S level in order to indicate improved community mobility.   Baseline:  Goal status: INITIAL  ASSESSMENT:  CLINICAL IMPRESSION: Patient continues to have trouble with ankle strategy this session noted during all dynamic tasks.  Some notable hamstring weakness during leg press activity today.  Will continue to address this in addition to global weakness particularly of the LE in order to progress dynamic stability and safety.  OBJECTIVE IMPAIRMENTS: Abnormal gait, decreased activity tolerance, decreased balance, decreased endurance, decreased mobility, decreased strength, impaired perceived functional ability, and postural dysfunction.   ACTIVITY LIMITATIONS: carrying, lifting, bending, standing, squatting, stairs, transfers, and locomotion level  PARTICIPATION LIMITATIONS: meal prep, cleaning, medication management, personal finances, driving, and community activity  PERSONAL FACTORS: 3+ comorbidities: see above  are also affecting patient's functional outcome.   REHAB POTENTIAL: Good  CLINICAL DECISION MAKING: Evolving/moderate complexity  EVALUATION COMPLEXITY: Moderate  PLAN:  PT FREQUENCY: 2x/week  PT DURATION: 8 weeks  PLANNED INTERVENTIONS: Therapeutic exercises, Therapeutic activity, Neuromuscular re-education, Balance training, Gait training, Patient/Family education, Self Care, Stair training, Vestibular training, DME instructions, and Aquatic Therapy  PLAN FOR NEXT SESSION: Add to HEP prn for LE strengthening (hips) and also balance, gait with and without cane, dynamic stability, gait speed, backwards ambulation, narrowed BOS, compliant surfaces, hamstring strength, could try blaze pods   Elease Etienne, PT, DPT [facility] [address] [place], [place], [address] Phone:  [phone]   Fax:  [phone] 06/08/22, 11:00 AM

## 2022-06-08 NOTE — Therapy (Signed)
OUTPATIENT SPEECH LANGUAGE PATHOLOGY TREATMENT   Patient Name: Antonio Adams MRN: [id number] DOB:[birth year], 77 y.o., male Today's Date: 06/08/2022  PCP: [name]., MD REFERRING PROVIDER: Hoyt Koch., MD  END OF SESSION:  End of Session - 06/08/22 0934     Visit Number 5    Number of Visits 25    Date for SLP Re-Evaluation 07/26/22    Authorization Type [employer] Medicare    SLP Start Time 0934    SLP Stop Time  1015    SLP Time Calculation (min) 41 min    Activity Tolerance Patient tolerated treatment well               Past Medical History:  Diagnosis Date   ADENOCARCINOMA, PROSTATE, GLEASON GRADE 6 12/21/2009   prostate cancer   ALLERGIC RHINITIS 03/16/2007   Allergy    Anxiety    Arthritis    back    BACK PAIN WITH RADICULOPATHY 11/23/2008   Cancer of kidney ([place])    partial right kidney removed   CARPAL TUNNEL SYNDROME, BILATERAL 03/16/2007   Chronic back pain    Constipation    takes Senokot daily   Depression    occasionally   DIABETES MELLITUS, TYPE II 03/16/2007   takes Januvia and MEtformin daily   GASTROINTESTINAL HEMORRHAGE, HX OF 03/16/2007   GERD 03/16/2007   takes Omeprazole daily   Glaucoma    mild - no eye drops   Hemorrhoids    History of colon polyps    HYPERLIPIDEMIA 03/16/2007   takes Crestor daily   HYPERTENSION 03/16/2007   takes Diltiazem and Lisinopril daily    Kidney cancer, primary, with metastasis from kidney to other site [facility])    LEG CRAMPS 05/22/2007   Nocturia    Overactive bladder    PARESTHESIA 05/21/2007   Proctitis    PTSD (post-traumatic stress disorder)    wakes up may be dreaming of fighting   Rectal bleeding    Dr.Norins has explained its from the Radiation that he has received   Rectal bleeding    Renal cell carcinoma ([place])    Sleep apnea    uses CPAP nightly   Stomach cancer ([place])    Urinary frequency    takes Toviaz daily   Past Surgical History:  Procedure Laterality Date   ADRENALECTOMY  Right 02/26/2020   Procedure: OPEN RIGHT RENAL CELL METASTATECTOMY AND  ADRENALECTOMY;  Surgeon: [name], MD;  Location: [facility];  Service: Urology;  Laterality: Right;   [place], 2008   repeat surgery; ESI '08   [place]     bilateral   CERVICAL FUSION     COLONOSCOPY     colonosocpy     ESOPHAGOGASTRODUODENOSCOPY     ED   FLEXIBLE SIGMOIDOSCOPY N/A 01/08/2017   Procedure: FLEXIBLE SIGMOIDOSCOPY;  Surgeon: Irene Shipper, MD;  Location: [place];  Service: Endoscopy;  Laterality: N/A;   HOT HEMOSTASIS N/A 01/08/2017   Procedure: HOT HEMOSTASIS (ARGON PLASMA COAGULATION/BICAP);  Surgeon: Irene Shipper, MD;  Location: [facility] END[facility];  Service: Endoscopy;  Laterality: N/A;   IR RADIOLOGIST EVAL & MGMT  12/17/2019   IR RADIOLOGIST EVAL & MGMT  07/01/2020   IR RADIOLOGIST EVAL & MGMT  08/25/2020   LUMBAR LAMINECTOMY  10/30/2011   Procedure: MICRODISCECTOMY LUMBAR LAMINECTOMY;  Surgeon: Jessy Oto, MD;  Location: [place];  Service: Orthopedics;  Laterality: Right;  Right L4-5 and L5-S1 Microdiscectomy   LUMBAR LAMINECTOMY  06/17/2012   Procedure: MICRODISCECTOMY LUMBAR LAMINECTOMY;  Surgeon: Jessy Oto, MD;  Location: [place];  Service: Orthopedics;  Laterality: N/A;  Right L5-S1 microdiscectomy   OPEN PARTIAL HEPATECTOMY  N/A 02/26/2020   Procedure: OPEN PARTIAL HEPATECTOMY, CHOLECYSTECTOMY;  Surgeon: Stark Klein, MD;  Location: [facility];  Service: General;  Laterality: N/A;   POLYPECTOMY     PROSTATE SURGERY  march 2012   seed implant   RADIOFREQUENCY ABLATION N/A 07/21/2020   Procedure: CT MICROWAVE ABLATION;  Surgeon: Criselda Peaches, MD;  Location: [facility];  Service: Anesthesiology;  Laterality: N/A;   ROBOTIC ASSITED PARTIAL NEPHRECTOMY Right 09/08/2015   Procedure: XI ROBOTIC ASSITED RIGHT PARTIAL NEPHRECTOMY;  Surgeon: [name], MD;  Location: [facility];  Service: Urology;  Laterality: Right;  Clamp on: 1033 Clamp off: 1103 Total Clamp Time: 30  minutes   SHOULDER ARTHROSCOPY WITH SUBACROMIAL DECOMPRESSION Left 01/20/2016   Procedure: LEFT SHOULDER ARTHROSCOPY WITH EXTENSIVE  DEBRIDEMENT, ACROMIOPLASTY;  Surgeon: Ninetta Lights, MD;  Location: [place];  Service: Orthopedics;  Laterality: Left;   SHOULDER SURGERY  LFT   Stress Cardiolite  12/24/2003   negative for ischemia   UPPER ESOPHAGEAL ENDOSCOPIC ULTRASOUND (EUS)  04/18/2016   [facility]   UPPER GASTROINTESTINAL ENDOSCOPY     WOUND EXPLORATION     management of a wound that he sustained in the [facility]   Patient Active Problem List   Diagnosis Date Noted   Hx of completed stroke 04/28/2022   Abnormal brain CT 04/07/2022   Encounter for general adult medical examination with abnormal findings 01/23/2022   Anemia due to zinc deficiency 12/20/2021   Low back pain 12/15/2021   Symptomatic anemia 12/15/2021   Cancer related pain 12/15/2021   Chronic heart failure with preserved ejection fraction (HFpEF) ([place]) 08/12/2021   Tinea cruris 08/12/2021   Anemia due to chronic kidney disease 07/20/2021   CKD (chronic kidney disease) stage 5, GFR less than 15 ml/min (HCC) 05/24/2021   BPH (benign prostatic hyperplasia) 05/24/2021   Diabetic renal disease ([place]) 05/20/2021   Dry eye syndrome of bilateral lacrimal glands 05/20/2021   Senile nuclear sclerosis 05/20/2021   Vitamin D deficiency 05/20/2021   Vitreous degeneration, unspecified eye 05/20/2021   Intrinsic eczema 04/18/2021   Atherosclerosis of aorta ([place]) 12/30/2020   Mixed conductive and sensorineural hearing loss of left ear with restricted hearing of right ear 09/01/2020   Tinnitus of both ears 05/20/2020   Metastatic renal cell carcinoma ([place]) 02/26/2020   Diabetic polyneuropathy associated with type 2 diabetes mellitus ([place]) 12/18/2019   Murmur, cardiac 10/08/2018   DDD (degenerative disc disease), cervical 10/08/2018   OAB (overactive bladder) 04/04/2018   Seasonal allergic rhinitis due to  pollen 11/01/2016   Microalbuminuria due to type 2 diabetes mellitus ([place]) 07/27/2016   Varicose veins of both lower extremities 08/06/2015   Varicose veins of lower extremities with ulcer ([place]) 06/16/2015   Internal hemorrhoid, bleeding 08/03/2014   Post traumatic stress disorder (PTSD) 01/29/2014   Spinal stenosis of lumbar region with radiculopathy 10/30/2011    Class: Chronic   Obstructive sleep apnea 11/24/2010   Obesity 11/24/2010   Routine general medical examination at a health care facility 11/24/2010   ADENOCARCINOMA, PROSTATE, GLEASON GRADE 6 12/21/2009   Type 2 diabetes mellitus with diabetic cataract ([place]) 03/16/2007   Essential hypertension 03/16/2007   GERD 03/16/2007    ONSET DATE: 04-07-22   REFERRING DIAG: F54.36 (ICD-10-CM) - Personal history of transient ischemic attack (  TIA), and cerebral infarction without residual deficits   THERAPY DIAG:  Cognitive communication deficit  Rationale for Evaluation and Treatment: Rehabilitation  SUBJECTIVE:   SUBJECTIVE STATEMENT: "He did great today" re: taking morning medications   PAIN:  Are you having pain? No  OBJECTIVE:    TODAY'S TREATMENT:   06-08-22: Pt has implemented some sleep hygiene strategies discussed prior session and reports to having 2 night of improved rest. Reports to feeling "much better. It makes a big difference." SLP brings awareness to improved sleep leading to pt being able to prepare self for the day and take medication yesterday and this morning. SLP provided direct instruction on benefit of ID challenges and then determining the HOW for improved completion/participation (meta-cognitive strategy instruction). Example given of how pt was able to have light breakfast this AM since he had previously made a plan for easy meals on therapy mornings. SLP probes how else meta-cognitive strategy can be employed with pt ID orientation to time of day as current challenge (e.g. when waking from nap will  think it's next day). SLP provided suggestion for large print digital clock with time, day, time of day and showed were can be purchased. Pt ID living room and bedroom would be good places to keep clocks to A in orientation.   06-06-22: Pt completed medication chart, with verification of accuracy by s/o, [name]. ID challenge of taking morning medication per advised time d/t difficulty getting sleep. Pt reports is waking around 3 AM and is unable to fall asleep until early morning which then results in sleeping until noon. Despite being awake early this morning for therapy, did not take meds d/t decreased appetite and time for breakfast. SLP A pt in generating list of quick and simple morning meals pt could try to facilitate medication administration on days that he must be up and out of door early. Pt generates x3 items with mod-I. SLP advises on sleep hygiene habits which may aid in improved sleep duration and quality-- create sleep pressure by sticking to routine, turn off tv in bedroom, creating nighttime routine, viewing morning sunlight for 5-10 minutes on sunny days, longer on cloudy days. Education on role of sleep in clarity of thought, encoding memories, and decision making.   05-30-22: Pt accompanied by spouse this session. Further CLQT eval completed this date. SLP observed pt difficulty with problem solving, organization, and planning during maze portion. Difficulty following directions during pattern creation task.   SLP introduced utilization of external aids/strategies (pill box, visual schedule) for medication management. Pt unable to recall any of his medications and required max A to add 8 medications to chart. Given name of medication, pt provided purpose of med in 1/6 opportunities. Demonstrates deviation in attention and working memory during completion of task.   05-25-22: PT ambulates into ST office, with occasional demonstration of impaired balance. Pt denies headache, speech presents as  WNL. SLP takes pt's BP, with initial reading of 161/91, subsequent reading after 10 minute latency period of 162/94. Pt reports did not take morning medications d/t not having time for breakfast. SLP advised pt will not be able to be seen for PT d/t raised BP and pt should routinely take medications at least 1 hr prior to therapy appointments. Pt and s/o verbalize agreement and undertanding. Pt agreeable to completion of PROM. SLP A in completion of Cognitive Function PROM with approx half completed. SLP elected to cease administration d/t pt selecting "0" or "none" for all items despite s/o providing  examples of impairments evidenced. Instead asked for input from s/o of functional goals with the following ID: Med administration adherence Med sorting Orientation to daily tasks and schedule  Memory for intended actions or verbal expression Ability to navigate to and from places in community  Will plan to complete standardized cognitive assessment at next session when pt's BP is controled.   05-03-22: Education provided on evaluation results and SLP's recommendations. Initiated training re: orientation aids. Pt verbalizes agreement with POC, all questions answered to satisfaction.    PATIENT EDUCATION: Education details: see above Person educated: Patient and Spouse Education method: Explanation, Demonstration, and Verbal cues Education comprehension: verbalized understanding, returned demonstration, and needs further education   GOALS: Goals reviewed with patient? Yes  SHORT TERM GOALS: Target date: 05-31-22  Pt will complete CLQT and PROM initial therapy session Baseline: Goal status: MET  2.  Pt will accurately sort medications with use of compensations PRN into pill box with occasional mod-A Baseline:  Goal status: INITIAL  3.  Pt will generate visual aid to assist with increased independence in paying bills with A from SLP and s/o Baseline:  Goal status: INITIAL  4.  Pt will  verbalize appropriate memory strategy given functional scenario with 80% accuracy   Baseline:  Goal status: INITIAL  5.  Pt and s/o will implement external A for orientation (e.g. calendar) with usual cues from care partner to initiate use  Baseline:  Goal status: INITIAL   LONG TERM GOALS: Target date: 07-26-22  With use of external aid, pt will answer orientation questions with 100% accuracy over 2 sessions Baseline:  Goal status: INITIAL  2.  Pt will verbalize x3 situations in which he successfully employed a memory strategy resulting in subjective satisfaction with ability to recall desired information Baseline:  Goal status: INITIAL  3.  Pt will pay x3 of own bills with supervision from s/o, with use of compensations PRN Baseline:  Goal status: INITIAL  4.  Pt will report improvement via PROM by dc Baseline:  Goal status: INITIAL  ASSESSMENT:  CLINICAL IMPRESSION: Patient is a 77 y.o. M who was seen today for cognitive linguistic evaluation s/p stroke. Mr. Goettel presents with moderate cognitive impairment in areas of orientation, awareness, memory, executive functioning. Language marked as severe per CLQT, however pt and caregiver insistent that communication not impacted by stroke. Overall reduced verbal output and resistant to speak during evaluation in response to SLP questions/comments. Pt requiring assistance from significant other to manage medication, schedule, and finances. Prior to stroke pt was fully independent. Pt would benefit from skilled ST to facilitate increased independence, enhance QoL, and reduced caregiver burden.    OBJECTIVE IMPAIRMENTS: include attention, memory, awareness, executive functioning, and expressive language. These impairments are limiting patient from managing medications, managing appointments, managing finances, household responsibilities, and ADLs/IADLs. Factors affecting potential to achieve goals and functional outcome are ability to  learn/carryover information and cooperation/participation level.. Patient will benefit from skilled SLP services to address above impairments and improve overall function.  REHAB POTENTIAL: Fair (carryover and participation)  PLAN:  SLP FREQUENCY: 2x/week  SLP DURATION: 12 weeks  PLANNED INTERVENTIONS: Cueing hierachy, Cognitive reorganization, Internal/external aids, Functional tasks, SLP instruction and feedback, Compensatory strategies, and Patient/family education    Su Monks, CCC-SLP 06/08/2022, 10:03 AM

## 2022-06-08 NOTE — Patient Instructions (Signed)
[employer]: digital day clock calendar large-time day and date display  Evening medications-- monitor how often you're forgetting. If needed, we may want to change where you keep it (e.g. moving it to sink where you brush your teeth)

## 2022-06-09 NOTE — Therapy (Deleted)
OUTPATIENT SPEECH LANGUAGE PATHOLOGY TREATMENT   Patient Name: Antonio Adams MRN: [id number] DOB:03/11/1946, 77 y.o., male Today's Date: 06/09/2022  PCP: [name]., MD REFERRING PROVIDER: Hoyt Koch., MD  END OF SESSION:      Past Medical History:  Diagnosis Date   ADENOCARCINOMA, PROSTATE, GLEASON GRADE 6 12/21/2009   prostate cancer   ALLERGIC RHINITIS 03/16/2007   Allergy    Anxiety    Arthritis    back    BACK PAIN WITH RADICULOPATHY 11/23/2008   Cancer of kidney ([place])    partial right kidney removed   CARPAL TUNNEL SYNDROME, BILATERAL 03/16/2007   Chronic back pain    Constipation    takes Senokot daily   Depression    occasionally   DIABETES MELLITUS, TYPE II 03/16/2007   takes Januvia and MEtformin daily   GASTROINTESTINAL HEMORRHAGE, HX OF 03/16/2007   GERD 03/16/2007   takes Omeprazole daily   Glaucoma    mild - no eye drops   Hemorrhoids    History of colon polyps    HYPERLIPIDEMIA 03/16/2007   takes Crestor daily   HYPERTENSION 03/16/2007   takes Diltiazem and Lisinopril daily    Kidney cancer, primary, with metastasis from kidney to other site [facility])    LEG CRAMPS 05/22/2007   Nocturia    Overactive bladder    PARESTHESIA 05/21/2007   Proctitis    PTSD (post-traumatic stress disorder)    wakes up may be dreaming of fighting   Rectal bleeding    Dr.Norins has explained its from the Radiation that he has received   Rectal bleeding    Renal cell carcinoma ([place])    Sleep apnea    uses CPAP nightly   Stomach cancer ([place])    Urinary frequency    takes Toviaz daily   Past Surgical History:  Procedure Laterality Date   ADRENALECTOMY Right 02/26/2020   Procedure: OPEN RIGHT RENAL CELL METASTATECTOMY AND  ADRENALECTOMY;  Surgeon: [name], MD;  Location: [facility];  Service: Urology;  Laterality: Right;   [place], 2008   repeat surgery; ESI '08   [place]     bilateral   CERVICAL FUSION      COLONOSCOPY     colonosocpy     ESOPHAGOGASTRODUODENOSCOPY     ED   FLEXIBLE SIGMOIDOSCOPY N/A 01/08/2017   Procedure: FLEXIBLE SIGMOIDOSCOPY;  Surgeon: Irene Shipper, MD;  Location: [place];  Service: Endoscopy;  Laterality: N/A;   HOT HEMOSTASIS N/A 01/08/2017   Procedure: HOT HEMOSTASIS (ARGON PLASMA COAGULATION/BICAP);  Surgeon: Irene Shipper, MD;  Location[facility];  Service: Endoscopy;  Laterality: N/A;   IR RADIOLOGIST EVAL & MGMT  12/17/2019   IR RADIOLOGIST EVAL & MGMT  07/01/2020   IR RADIOLOGIST EVAL & MGMT  08/25/2020   LUMBAR LAMINECTOMY  10/30/2011   Procedure: MICRODISCECTOMY LUMBAR LAMINECTOMY;  Surgeon: Jessy Oto, MD;  Location: [place];  Service: Orthopedics;  Laterality: Right;  Right L4-5 and L5-S1 Microdiscectomy   LUMBAR LAMINECTOMY  06/17/2012   Procedure: MICRODISCECTOMY LUMBAR LAMINECTOMY;  Surgeon: Jessy Oto, MD;  Location: [place];  Service: Orthopedics;  Laterality: N/A;  Right L5-S1 microdiscectomy   OPEN PARTIAL HEPATECTOMY  N/A 02/26/2020   Procedure: OPEN PARTIAL HEPATECTOMY, CHOLECYSTECTOMY;  Surgeon: Stark Klein, MD;  Location: [facility];  Service: General;  Laterality: N/A;   POLYPECTOMY     PROSTATE SURGERY  march 2012   seed implant   RADIOFREQUENCY ABLATION  N/A 07/21/2020   Procedure: CT MICROWAVE ABLATION;  Surgeon: Criselda Peaches, MD;  Location: [facility];  Service: Anesthesiology;  Laterality: N/A;   ROBOTIC ASSITED PARTIAL NEPHRECTOMY Right 09/08/2015   Procedure: XI ROBOTIC ASSITED RIGHT PARTIAL NEPHRECTOMY;  Surgeon: [name], MD;  [facility];  Service: Urology;  Laterality: Right;  Clamp on: 1033 Clamp off: 1103 Total Clamp Time: 30 minutes   SHOULDER ARTHROSCOPY WITH SUBACROMIAL DECOMPRESSION Left 01/20/2016   Procedure: LEFT SHOULDER ARTHROSCOPY WITH EXTENSIVE  DEBRIDEMENT, ACROMIOPLASTY;  Surgeon: Ninetta Lights, MD;  Location: [place];  Service: Orthopedics;  Laterality: Left;   SHOULDER SURGERY  LFT    Stress Cardiolite  12/24/2003   negative for ischemia   UPPER ESOPHAGEAL ENDOSCOPIC ULTRASOUND (EUS)  04/18/2016   [facility]   UPPER GASTROINTESTINAL ENDOSCOPY     WOUND EXPLORATION     management of a wound that he sustained in the [place]   Patient Active Problem List   Diagnosis Date Noted   Hx of completed stroke 04/28/2022   Abnormal brain CT 04/07/2022   Encounter for general adult medical examination with abnormal findings 01/23/2022   Anemia due to zinc deficiency 12/20/2021   Low back pain 12/15/2021   Symptomatic anemia 12/15/2021   Cancer related pain 12/15/2021   Chronic heart failure with preserved ejection fraction (HFpEF) ([place]) 08/12/2021   Tinea cruris 08/12/2021   Anemia due to chronic kidney disease 07/20/2021   CKD (chronic kidney disease) stage 5, GFR less than 15 ml/min (HCC) 05/24/2021   BPH (benign prostatic hyperplasia) 05/24/2021   Diabetic renal disease ([place]) 05/20/2021   Dry eye syndrome of bilateral lacrimal glands 05/20/2021   Senile nuclear sclerosis 05/20/2021   Vitamin D deficiency 05/20/2021   Vitreous degeneration, unspecified eye 05/20/2021   Intrinsic eczema 04/18/2021   Atherosclerosis of aorta ([place]) 12/30/2020   Mixed conductive and sensorineural hearing loss of left ear with restricted hearing of right ear 09/01/2020   Tinnitus of both ears 05/20/2020   Metastatic renal cell carcinoma ([place]) 02/26/2020   Diabetic polyneuropathy associated with type 2 diabetes mellitus ([place]) 12/18/2019   Murmur, cardiac 10/08/2018   DDD (degenerative disc disease), cervical 10/08/2018   OAB (overactive bladder) 04/04/2018   Seasonal allergic rhinitis due to pollen 11/01/2016   Microalbuminuria due to type 2 diabetes mellitus ([place]) 07/27/2016   Varicose veins of both lower extremities 08/06/2015   Varicose veins of lower extremities with ulcer ([place]) 06/16/2015   Internal hemorrhoid, bleeding 08/03/2014   Post traumatic stress disorder (PTSD) 01/29/2014    Spinal stenosis of lumbar region with radiculopathy 10/30/2011    Class: Chronic   Obstructive sleep apnea 11/24/2010   Obesity 11/24/2010   Routine general medical examination at a health care facility 11/24/2010   ADENOCARCINOMA, PROSTATE, GLEASON GRADE 6 12/21/2009   Type 2 diabetes mellitus with diabetic cataract ([place]) 03/16/2007   Essential hypertension 03/16/2007   GERD 03/16/2007    ONSET DATE: 04-07-22   REFERRING DIAG: [id number] (ICD-10-CM) - Personal history of transient ischemic attack (TIA), and cerebral infarction without residual deficits   THERAPY DIAG:  No diagnosis found.  Rationale for Evaluation and Treatment: Rehabilitation  SUBJECTIVE:   SUBJECTIVE STATEMENT: ***  PAIN:  Are you having pain? No  OBJECTIVE:    TODAY'S TREATMENT:   06-09-21: ***  06-08-22: Pt has implemented some sleep hygiene strategies discussed prior session and reports to having 2 night of improved rest. Reports to feeling "much better. It makes a  big difference." SLP brings awareness to improved sleep leading to pt being able to prepare self for the day and take medication yesterday and this morning. SLP provided direct instruction on benefit of ID challenges and then determining the HOW for improved completion/participation (meta-cognitive strategy instruction). Example given of how pt was able to have light breakfast this AM since he had previously made a plan for easy meals on therapy mornings. SLP probes how else meta-cognitive strategy can be employed with pt ID orientation to time of day as current challenge (e.g. when waking from nap will think it's next day). SLP provided suggestion for large print digital clock with time, day, time of day and showed were can be purchased. Pt ID living room and bedroom would be good places to keep clocks to A in orientation.   PATIENT EDUCATION: Education details: see above Person educated: Patient and Spouse Education method: Explanation,  Demonstration, and Verbal cues Education comprehension: verbalized understanding, returned demonstration, and needs further education   GOALS: Goals reviewed with patient? Yes  SHORT TERM GOALS: Target date: 05-31-22  Pt will complete CLQT and PROM initial therapy session Baseline: Goal status: MET  2.  Pt will accurately sort medications with use of compensations PRN into pill box with occasional mod-A Baseline:  Goal status: INITIAL  3.  Pt will generate visual aid to assist with increased independence in paying bills with A from SLP and s/o Baseline:  Goal status: INITIAL  4.  Pt will verbalize appropriate memory strategy given functional scenario with 80% accuracy   Baseline:  Goal status: INITIAL  5.  Pt and s/o will implement external A for orientation (e.g. calendar) with usual cues from care partner to initiate use  Baseline:  Goal status: INITIAL   LONG TERM GOALS: Target date: 07-26-22  With use of external aid, pt will answer orientation questions with 100% accuracy over 2 sessions Baseline:  Goal status: INITIAL  2.  Pt will verbalize x3 situations in which he successfully employed a memory strategy resulting in subjective satisfaction with ability to recall desired information Baseline:  Goal status: INITIAL  3.  Pt will pay x3 of own bills with supervision from s/o, with use of compensations PRN Baseline:  Goal status: INITIAL  4.  Pt will report improvement via PROM by dc Baseline:  Goal status: INITIAL  ASSESSMENT:  CLINICAL IMPRESSION: Patient is a 77 y.o. M who was seen today for cognitive linguistic evaluation s/p stroke. Mr. Bechen presents with moderate cognitive impairment in areas of orientation, awareness, memory, executive functioning. Language marked as severe per CLQT, however pt and caregiver insistent that communication not impacted by stroke. Overall reduced verbal output and resistant to speak during evaluation in response to SLP  questions/comments. Pt requiring assistance from significant other to manage medication, schedule, and finances. Prior to stroke pt was fully independent. Pt would benefit from skilled ST to facilitate increased independence, enhance QoL, and reduced caregiver burden.    OBJECTIVE IMPAIRMENTS: include attention, memory, awareness, executive functioning, and expressive language. These impairments are limiting patient from managing medications, managing appointments, managing finances, household responsibilities, and ADLs/IADLs. Factors affecting potential to achieve goals and functional outcome are ability to learn/carryover information and cooperation/participation level.. Patient will benefit from skilled SLP services to address above impairments and improve overall function.  REHAB POTENTIAL: Fair (carryover and participation)  PLAN:  SLP FREQUENCY: 2x/week  SLP DURATION: 12 weeks  PLANNED INTERVENTIONS: Cueing hierachy, Cognitive reorganization, Internal/external aids, Functional tasks, SLP instruction and  feedback, Compensatory strategies, and Patient/family education    Su Monks, [place] 06/09/2022, 3:25 PM

## 2022-06-12 ENCOUNTER — Encounter: Payer: Self-pay | Admitting: Internal Medicine

## 2022-06-12 ENCOUNTER — Ambulatory Visit (INDEPENDENT_AMBULATORY_CARE_PROVIDER_SITE_OTHER): Payer: Medicare Other | Admitting: Internal Medicine

## 2022-06-12 VITALS — BP 138/86 | HR 73 | Temp 98.2°F | Resp 16 | Ht 75.0 in | Wt 193.0 lb

## 2022-06-12 DIAGNOSIS — I10 Essential (primary) hypertension: Secondary | ICD-10-CM

## 2022-06-12 DIAGNOSIS — N185 Chronic kidney disease, stage 5: Secondary | ICD-10-CM | POA: Diagnosis not present

## 2022-06-12 DIAGNOSIS — N3281 Overactive bladder: Secondary | ICD-10-CM

## 2022-06-12 DIAGNOSIS — Z23 Encounter for immunization: Secondary | ICD-10-CM

## 2022-06-12 MED ORDER — BOOSTRIX 5-2.5-18.5 LF-MCG/0.5 IM SUSP: 0.5000 mL | Freq: Once | INTRAMUSCULAR | 0 refills | Status: AC

## 2022-06-12 MED ORDER — SHINGRIX 50 MCG/0.5ML IM SUSR: 0.5000 mL | Freq: Once | INTRAMUSCULAR | 1 refills | Status: AC

## 2022-06-12 NOTE — Progress Notes (Signed)
Subjective:  Patient ID: Antonio Adams, male    DOB: [birth year]  Age: 77 y.o. MRN: [id number]  CC: Anemia   HPI [name] presents for f/up ----  He complains of weight loss, poor appetite, and no taste for food.  Outpatient Medications Prior to Visit  Medication Sig Dispense Refill   acetaminophen (TYLENOL) 500 MG tablet Take 1,000 mg by mouth every 6 (six) hours as needed for mild pain.     Apoaequorin (PREVAGEN EXTRA STRENGTH PO) Take 1 tablet by mouth daily.     aspirin EC 81 MG tablet Take 1 tablet (81 mg total) by mouth daily. Swallow whole. 30 tablet 12   calcitRIOL (ROCALTROL) 0.25 MCG capsule Take 0.25 mcg by mouth daily.     clopidogrel (PLAVIX) 75 MG tablet Take 1 tablet (75 mg total) by mouth daily. Take Plavix along with aspirin for 3 months, after 3 months stop Plavix and just continue on aspirin. 30 tablet 2   Cyanocobalamin (B-12 PO) Take 1,000 mcg by mouth daily.     cyclobenzaprine (FLEXERIL) 5 MG tablet Take 1 tablet (5 mg total) by mouth 2 (two) times daily as needed for muscle spasms. 6 tablet 0   DULoxetine (CYMBALTA) 20 MG capsule Take 1 capsule (20 mg total) by mouth daily. 30 capsule 5   ferrous sulfate 325 (65 FE) MG tablet Take 1 tablet (325 mg total) by mouth 2 (two) times daily. Patient needs office visit for further refills (Patient taking differently: Take 325 mg by mouth daily with breakfast.) 60 tablet 1   fluocinonide-emollient (LIDEX-E) 0.05 % cream Apply 1 Application topically 2 (two) times daily as needed (dry skin).     furosemide (LASIX) 40 MG tablet TAKE 1 TABLET BY MOUTH EVERY DAY (Patient taking differently: Take 40 mg by mouth in the morning, at noon, and at bedtime. Patient taking 4 times daily for swelling; 2 in the morning and 2 in the evening.) 90 tablet 1   gemfibrozil (LOPID) 600 MG tablet Take 600 mg by mouth 2 (two) times daily before a meal.     LOKELMA 10 g PACK packet Take 10 g by mouth daily.     megestrol (MEGACE)  40 MG/ML suspension TAKE 10 MLS (400 MG TOTAL) BY MOUTH DAILY. 240 mL 0   mirtazapine (REMERON SOL-TAB) 15 MG disintegrating tablet Take 15 mg by mouth at bedtime.     Omega-3 Fatty Acids (FISH OIL PO) Take 1,000 mg by mouth 2 (two) times a week.     OVER THE COUNTER MEDICATION Take 1 tablet by mouth daily. Focus factor     pantoprazole (PROTONIX) 20 MG tablet Take 1 tablet (20 mg total) by mouth daily. (Patient taking differently: Take 20 mg by mouth daily as needed for heartburn or indigestion.) 30 tablet 0   polyvinyl alcohol (LIQUIFILM TEARS) 1.4 % ophthalmic solution Place 1 drop into both eyes daily as needed for dry eyes.     rosuvastatin (CRESTOR) 20 MG tablet Take 1 tablet (20 mg total) by mouth daily. 30 tablet 1   sildenafil (VIAGRA) 100 MG tablet Take 100 mg by mouth daily as needed.     sodium bicarbonate 650 MG tablet Take 2 tablets (1,300 mg total) by mouth 2 (two) times daily. 60 tablet 1   tamsulosin (FLOMAX) 0.4 MG CAPS capsule TAKE 1 CAPSULE (0.4 MG TOTAL) BY MOUTH DAILY. (Patient taking differently: Take 0.4 mg by mouth daily.) 30 capsule 1   zinc gluconate 50 MG  tablet Take 1 tablet (50 mg total) by mouth daily. 90 tablet 1   solifenacin (VESICARE) 5 MG tablet TAKE 1 TABLET (5 MG TOTAL) BY MOUTH DAILY. 90 tablet 1   metoprolol tartrate (LOPRESSOR) 25 MG tablet Take 1 tablet (25 mg total) by mouth 2 (two) times daily. 180 tablet 3   No facility-administered medications prior to visit.    ROS Review of Systems  Constitutional:  Positive for appetite change, fatigue and unexpected weight change. Negative for diaphoresis and fever.  HENT: Negative.    Eyes: Negative.   Respiratory:  Positive for shortness of breath. Negative for cough, chest tightness and wheezing.   Cardiovascular:  Negative for chest pain, palpitations and leg swelling.  Gastrointestinal:  Negative for abdominal pain, diarrhea, nausea and vomiting.  Endocrine: Positive for polyuria.  Genitourinary:   Positive for frequency and urgency. Negative for difficulty urinating and dysuria.  Musculoskeletal:  Positive for arthralgias and back pain. Negative for joint swelling and myalgias.  Skin: Negative.   Neurological: Negative.  Negative for dizziness and weakness.  Hematological:  Negative for adenopathy. Does not bruise/bleed easily.  Psychiatric/Behavioral: Negative.      Objective:  BP 138/86 (BP Location: Left Arm, Patient Position: Sitting, Cuff Size: Large)   Pulse 73   Temp 98.2 F (36.8 C) (Oral)   Resp 16   Ht '6\' 3"'$  (1.905 m)   Wt 193 lb (87.5 kg)   SpO2 98%   BMI 24.12 kg/m   BP Readings from Last 3 Encounters:  06/15/22 (!) 163/96  06/12/22 138/86  06/08/22 (!) 151/77    Wt Readings from Last 3 Encounters:  06/12/22 193 lb (87.5 kg)  05/29/22 194 lb 6.4 oz (88.2 kg)  04/25/22 206 lb (93.4 kg)    Physical Exam Constitutional:      Appearance: He is ill-appearing.  Eyes:     General: No scleral icterus.    Conjunctiva/sclera: Conjunctivae normal.  Cardiovascular:     Rate and Rhythm: Normal rate and regular rhythm.     Heart sounds: Murmur heard.     Systolic murmur is present with a grade of 2/6.     No diastolic murmur is present.     No friction rub. No gallop.  Pulmonary:     Effort: Pulmonary effort is normal.     Breath sounds: No stridor. Examination of the right-lower field reveals rales. Examination of the left-lower field reveals rales. Rales present. No wheezing or rhonchi.  Abdominal:     General: Abdomen is flat.     Palpations: There is no mass.     Tenderness: There is no abdominal tenderness. There is no guarding.     Hernia: No hernia is present.  Musculoskeletal:        General: Normal range of motion.     Cervical back: Neck supple.     Right lower leg: No edema.     Left lower leg: No edema.  Lymphadenopathy:     Cervical: No cervical adenopathy.  Skin:    General: Skin is warm and dry.  Neurological:     General: No focal  deficit present.     Mental Status: He is alert.  Psychiatric:        Mood and Affect: Mood normal.        Behavior: Behavior normal.     Lab Results  Component Value Date   WBC 3.9 (L) 04/14/2022   HGB 10.0 (L) 06/07/2022   HCT 21.9 (L) 04/14/2022  PLT 196 04/14/2022   GLUCOSE 110 (H) 04/14/2022   CHOL 219 (H) 04/08/2022   TRIG 113 04/08/2022   HDL 37 (L) 04/08/2022   LDLCALC 159 (H) 04/08/2022   ALT 13 04/14/2022   AST 19 04/14/2022   NA 139 04/14/2022   K 3.5 04/14/2022   CL 108 04/14/2022   CREATININE 7.28 (HH) 04/14/2022   BUN 45 (H) 04/14/2022   CO2 19 (L) 04/14/2022   TSH 2.577 10/04/2021   PSA 0.01 (L) 05/20/2020   INR 1.2 05/24/2021   HGBA1C 5.4 04/08/2022   MICROALBUR 97.0 12/18/2019    No results found.  Assessment & Plan:   Tyreon was seen today for anemia.  Diagnoses and all orders for this visit:  Need for prophylactic vaccination with combined diphtheria-tetanus-pertussis (DTP) vaccine -     Tdap (BOOSTRIX) 5-2.5-18.5 LF-MCG/0.5 injection; Inject 0.5 mLs into the muscle once for 1 dose.  Vaccine for VZV (varicella-zoster virus) -     Zoster Vaccine Adjuvanted [facility]) injection; Inject 0.5 mLs into the muscle once for 1 dose.  OAB (overactive bladder) -     solifenacin (VESICARE) 5 MG tablet; Take 1 tablet (5 mg total) by mouth daily.  Essential hypertension- His blood pressure is adequately well-controlled.  CKD (chronic kidney disease) stage 5, GFR less than 15 ml/min (HCC)- He is followed by nephrology and is not willing to undergo dialysis.   I am having Otila Back. Johnnye Sima start on Shingrix and Boostrix. I am also having him maintain his tamsulosin, ferrous sulfate, polyvinyl alcohol, Omega-3 Fatty Acids (FISH OIL PO), OVER THE COUNTER MEDICATION, Lokelma, Apoaequorin (PREVAGEN EXTRA STRENGTH PO), metoprolol tartrate, furosemide, zinc gluconate, calcitRIOL, Cyanocobalamin (B-12 PO), pantoprazole, cyclobenzaprine, gemfibrozil,  acetaminophen, fluocinonide-emollient, sodium bicarbonate, rosuvastatin, aspirin EC, clopidogrel, megestrol, sildenafil, DULoxetine, mirtazapine, and solifenacin.  Meds ordered this encounter  Medications   Zoster Vaccine Adjuvanted [facility]) injection    Sig: Inject 0.5 mLs into the muscle once for 1 dose.    Dispense:  0.5 mL    Refill:  1   Tdap (BOOSTRIX) 5-2.5-18.5 LF-MCG/0.5 injection    Sig: Inject 0.5 mLs into the muscle once for 1 dose.    Dispense:  0.5 mL    Refill:  0   solifenacin (VESICARE) 5 MG tablet    Sig: Take 1 tablet (5 mg total) by mouth daily.    Dispense:  90 tablet    Refill:  1     Follow-up: Return in about 6 months (around 12/11/2022).  Scarlette Calico, MD

## 2022-06-12 NOTE — Therapy (Incomplete)
OUTPATIENT OCCUPATIONAL THERAPY NEURO TREATMENT  Patient Name: Antonio Adams MRN: [id number] DOB:06/06/1945, 77 y.o., male Today's Date: 06/12/2022  PCP: Dr. Sharlet Salina REFERRING PROVIDER: Dr. Sharlet Salina  END OF SESSION:     Past Medical History:  Diagnosis Date   ADENOCARCINOMA, PROSTATE, GLEASON GRADE 6 12/21/2009   prostate cancer   ALLERGIC RHINITIS 03/16/2007   Allergy    Anxiety    Arthritis    back    BACK PAIN WITH RADICULOPATHY 11/23/2008   Cancer of kidney ([place])    partial right kidney removed   CARPAL TUNNEL SYNDROME, BILATERAL 03/16/2007   Chronic back pain    Constipation    takes Senokot daily   Depression    occasionally   DIABETES MELLITUS, TYPE II 03/16/2007   takes Januvia and MEtformin daily   GASTROINTESTINAL HEMORRHAGE, HX OF 03/16/2007   GERD 03/16/2007   takes Omeprazole daily   Glaucoma    mild - no eye drops   Hemorrhoids    History of colon polyps    HYPERLIPIDEMIA 03/16/2007   takes Crestor daily   HYPERTENSION 03/16/2007   takes Diltiazem and Lisinopril daily    Kidney cancer, primary, with metastasis from kidney to other site [facility])    LEG CRAMPS 05/22/2007   Nocturia    Overactive bladder    PARESTHESIA 05/21/2007   Proctitis    PTSD (post-traumatic stress disorder)    wakes up may be dreaming of fighting   Rectal bleeding    Dr.Norins has explained its from the Radiation that he has received   Rectal bleeding    Renal cell carcinoma ([place])    Sleep apnea    uses CPAP nightly   Stomach cancer ([place])    Urinary frequency    takes Toviaz daily   Past Surgical History:  Procedure Laterality Date   ADRENALECTOMY Right 02/26/2020   Procedure: OPEN RIGHT RENAL CELL METASTATECTOMY AND  ADRENALECTOMY;  Surgeon: [name], MD;  Location: [facility];  Service: Urology;  Laterality: Right;   [place], 2008   repeat surgery; ESI '08   [place]     bilateral   CERVICAL FUSION     COLONOSCOPY     colonosocpy      ESOPHAGOGASTRODUODENOSCOPY     ED   FLEXIBLE SIGMOIDOSCOPY N/A 01/08/2017   Procedure: FLEXIBLE SIGMOIDOSCOPY;  Surgeon: Irene Shipper, MD;  Location: [place];  Service: Endoscopy;  Laterality: N/A;   HOT HEMOSTASIS N/A 01/08/2017   Procedure: HOT HEMOSTASIS (ARGON PLASMA COAGULATION/BICAP);  Surgeon: Irene Shipper, MD;  Location: [facility]COPY;  Service: Endoscopy;  Laterality: N/A;   IR RADIOLOGIST EVAL & MGMT  12/17/2019   IR RADIOLOGIST EVAL & MGMT  07/01/2020   IR RADIOLOGIST EVAL & MGMT  08/25/2020   LUMBAR LAMINECTOMY  10/30/2011   Procedure: MICRODISCECTOMY LUMBAR LAMINECTOMY;  Surgeon: Jessy Oto, MD;  Location: [place];  Service: Orthopedics;  Laterality: Right;  Right L4-5 and L5-S1 Microdiscectomy   LUMBAR LAMINECTOMY  06/17/2012   Procedure: MICRODISCECTOMY LUMBAR LAMINECTOMY;  Surgeon: Jessy Oto, MD;  Location: [place];  Service: Orthopedics;  Laterality: N/A;  Right L5-S1 microdiscectomy   OPEN PARTIAL HEPATECTOMY  N/A 02/26/2020   Procedure: OPEN PARTIAL HEPATECTOMY, CHOLECYSTECTOMY;  Surgeon: Stark Klein, MD;  [facility];  Service: General;  Laterality: N/A;   POLYPECTOMY     PROSTATE SURGERY  march 2012   seed implant   RADIOFREQUENCY ABLATION N/A 07/21/2020   Procedure: CT  MICROWAVE ABLATION;  Surgeon: Criselda Peaches, MD;  Location[facility];  Service: Anesthesiology;  Laterality: N/A;   ROBOTIC ASSITED PARTIAL NEPHRECTOMY Right 09/08/2015   Procedure: XI ROBOTIC ASSITED RIGHT PARTIAL NEPHRECTOMY;  Surgeon: [name], MD;  [facility];  Service: Urology;  Laterality: Right;  Clamp on: 1033 Clamp off: 1103 Total Clamp Time: 30 minutes   SHOULDER ARTHROSCOPY WITH SUBACROMIAL DECOMPRESSION Left 01/20/2016   Procedure: LEFT SHOULDER ARTHROSCOPY WITH EXTENSIVE  DEBRIDEMENT, ACROMIOPLASTY;  Surgeon: Ninetta Lights, MD;  Location: [place];  Service: Orthopedics;  Laterality: Left;   SHOULDER SURGERY  LFT   Stress Cardiolite  12/24/2003    negative for ischemia   UPPER ESOPHAGEAL ENDOSCOPIC ULTRASOUND (EUS)  04/18/2016   [facility]   UPPER GASTROINTESTINAL ENDOSCOPY     WOUND EXPLORATION     management of a wound that he sustained in the [place]   Patient Active Problem List   Diagnosis Date Noted   Hx of completed stroke 04/28/2022   Abnormal brain CT 04/07/2022   Encounter for general adult medical examination with abnormal findings 01/23/2022   Anemia due to zinc deficiency 12/20/2021   Low back pain 12/15/2021   Symptomatic anemia 12/15/2021   Cancer related pain 12/15/2021   Chronic heart failure with preserved ejection fraction (HFpEF) ([place]) 08/12/2021   Tinea cruris 08/12/2021   Anemia due to chronic kidney disease 07/20/2021   CKD (chronic kidney disease) stage 5, GFR less than 15 ml/min (HCC) 05/24/2021   BPH (benign prostatic hyperplasia) 05/24/2021   Diabetic renal disease ([place]) 05/20/2021   Dry eye syndrome of bilateral lacrimal glands 05/20/2021   Senile nuclear sclerosis 05/20/2021   Vitamin D deficiency 05/20/2021   Vitreous degeneration, unspecified eye 05/20/2021   Intrinsic eczema 04/18/2021   Atherosclerosis of aorta ([place]) 12/30/2020   Mixed conductive and sensorineural hearing loss of left ear with restricted hearing of right ear 09/01/2020   Tinnitus of both ears 05/20/2020   Metastatic renal cell carcinoma ([place]) 02/26/2020   Diabetic polyneuropathy associated with type 2 diabetes mellitus ([place]) 12/18/2019   Murmur, cardiac 10/08/2018   DDD (degenerative disc disease), cervical 10/08/2018   OAB (overactive bladder) 04/04/2018   Seasonal allergic rhinitis due to pollen 11/01/2016   Microalbuminuria due to type 2 diabetes mellitus ([place]) 07/27/2016   Varicose veins of both lower extremities 08/06/2015   Varicose veins of lower extremities with ulcer ([place]) 06/16/2015   Internal hemorrhoid, bleeding 08/03/2014   Post traumatic stress disorder (PTSD) 01/29/2014   Spinal stenosis of lumbar  region with radiculopathy 10/30/2011    Class: Chronic   Obstructive sleep apnea 11/24/2010   Obesity 11/24/2010   Routine general medical examination at a health care facility 11/24/2010   ADENOCARCINOMA, PROSTATE, GLEASON GRADE 6 12/21/2009   Type 2 diabetes mellitus with diabetic cataract ([place]) 03/16/2007   Essential hypertension 03/16/2007   GERD 03/16/2007    ONSET DATE: 04/07/22  REFERRING DIAG: Diagnosis Z86.73 (ICD-10-CM) - Personal history of transient ischemic attack (TIA), and cerebral infarction without residual deficits  THERAPY DIAG:  No diagnosis found.  Rationale for Evaluation and Treatment: Rehabilitation  SUBJECTIVE:   SUBJECTIVE STATEMENT: *** It just took me forever (re:  PVC design)   Pt accompanied by: significant other fiancee [name]  PERTINENT HISTORY: 77 y.o. R-handed male with history of DM2 with diabetic polyneuropathy, OSA on nightly CPAP, PTSD, history of lumbar stenosis with chronic back pain, metastatic renal carcinoma with lymphadenopathy and questionable hepatic involvement noted  in 2022, not on chemo due to poor tolerance to the medications, and without significant progression since May 2023, only on surveillance imaging, stage IV, eczema, vitamin D deficiency, CKD stage V refusing HD, BPH, anemia of CKD  Zinc deficiency, chronic heart failure, mild cognitive impairment, s/p left PCA territory acute CVA on 04/07/22   DIAGNOSTIC FINDINGS:  MRI of the brain showing a left PCA large infarct and punctate right PCA infarct MRA remarkable for left PCA P3 branch occlusion, ICA siphon atherosclerosis with possible tandem significant stenosis of the right siphon  PRECAUTIONS: Fall, hx of CA  WEIGHT BEARING RESTRICTIONS: No  PAIN:  Are you having pain? No  FALLS: Has patient fallen in last 6 months? Yes. Number of falls 1  LIVING ENVIRONMENT: Lives with: lives alone, fiancee is there most of the time Lives in: House/apartment Stairs: 1 level stairs  to enter Has following equipment at home: Single point cane  PLOF: Independent  PATIENT GOALS: to get right  OBJECTIVE:   HAND DOMINANCE: Right  ADLs: Overall ADLs: fall risk in standing Transfers/ambulation related to [place] Eating: mod I Grooming: mod I UB Dressing: mod I  LB Dressing: supervision, increased time required, therapist recommends pt sits for LB dressing due to fall risk Toileting: mod I using cane Bathing: supervision Tub Shower transfers: min A currently standing in shower, no grab bars, using tub shower does not have seat Equipment: none  IADLs: Shopping: has assistance Light housekeeping: fiancee handles Meal Prep: does not cook Community mobility: ambulates with cane, supervision-minguard, unsteady and walks close to items Medication management: fiancee is helping with meds Financial management: fiancee is assisting  Handwriting: 100% legible  MOBILITY STATUS:  ambulates with minguard with cane , some unsteadiness and walks close to walls and doorways     FUNCTIONAL OUTCOME MEASURES: FOTO: TBA  UPPER EXTREMITY ROM:  remaining AROM is WFLS   Active ROM Right eval Left eval  Shoulder flexion 105 105  Shoulder abduction    Shoulder adduction    Shoulder extension    Shoulder internal rotation    Shoulder external rotation    Elbow flexion    Elbow extension -15 -20  Wrist flexion    Wrist extension    Wrist ulnar deviation    Wrist radial deviation    Wrist pronation    Wrist supination    (Blank rows = not tested)  UPPER EXTREMITY MMT:     MMT Right eval Left eval  Shoulder flexion 4-/5 3+/5  Shoulder abduction    Shoulder adduction    Shoulder extension    Shoulder internal rotation    Shoulder external rotation    Middle trapezius    Lower trapezius    Elbow flexion 4+/5 4+/5  Elbow extension 4/5 4/5  Wrist flexion    Wrist extension    Wrist ulnar deviation    Wrist radial deviation    Wrist pronation     Wrist supination    (Blank rows = not tested)  HAND FUNCTION: Grip strength: Right: 46.9 lbs; Left: 40.3 lbs  COORDINATION: 9 Hole Peg test: Right: 44 sec; Left: 40 sec   SENSATION: Light touch: Impaired  for ring and small finger R hand ulnar distribution, reports this was since CA treatment  EDEMA: n/a    COGNITION: Overall cognitive status: Impaired Decreased short term memory, delayed processing and decreased awareness  VISION:  Baseline vision: Wears glasses all the time   VISION ASSESSMENT: Number cancellation task 1.5 M :  7/80 missed, 91% Visual Fields: per hospital records R homonymous hemianopsia  Vision to be further assessed in a functional context  Patient has difficulty with following activities due to following visual impairments: ambulates close to walls and doorways on R side    OBSERVATIONS: Pt with minimal verbalizations and poor awareness of deficits   TODAY'S TREATMENT:                                                                                                                               ***  Copying simple PVC design x2 with mod cueing for set-up and mod cueing for problem solving/visual scanning.  Completing 12-piece puzzle with min cueing for problem-solving.   PATIENT EDUCATION: Education details: *** use of simple puzzles and word searches at home Person educated: Patient and Spouse Education method: Explanation Education comprehension: verbalized understanding    HOME EXERCISE PROGRAM: 05/30/22: Visual scanning strategies, coordination HEP 06/01/22: cane HEP   06/06/22: memory strategies   GOALS: Goals reviewed with patient? Yes  SHORT TERM GOALS: Target date: 06/02/22  I with initial HEP : Goal status: MET w/ cues  2.  Pt will perform tub transfers safely mod I using tub bench prn. Baseline: doe not have tub bench yet, currently supervision, min A Goal status: MET  3.  Pt/caregiver will verbalize understanding of  compensations for visual deficits  Goal status: IN PROGRESS  4.  Pt will demonstrate improved fine motor coordination  for ADLs with RUE as evidenced by decreasing 9 hole peg test score by 3 secs Baseline: 9 Hole Peg test: Right: 44 sec; Left: 40 sec Goal status: IN PROGRESS  5.   Pt will navigate a busy environment and locate items with 75% accuracy without bumping into items. Baseline: nearly bumps into wall and doorway on right side Goal status: INITIAL  6.  Pt will verbalize understanding of compensations for short term memory deficits. Baseline:  Goal status: IN PROGRESS   LONG TERM GOALS: Target date: 07/25/21  I with updated HEP Baseline:  Goal status: INITIAL  2.  Pt will navigate a busy environment and locate items with 90% accuracy. Baseline: nearly bumps into wall and doorway on right side Goal status: INITIAL  3.  Pt will perform all basic ADLs mod I with good safety awareness. Baseline: supervision, min A Goal status: INITIAL  4.  Pt will demonstrate improved fine motor coordination with RUE  for ADLs as evidenced by decreasing 9 hole peg test score by 6 secs form initial eval. Baseline: 9 Hole Peg test: Right: 44 sec; Left: 40 sec Goal status: INITIAL  5.  Pt will demonstrate ability to retrieve lightweight object at 110* shoulder flexion and -10 elbow extension with RUE Baseline: shoulder flexion 105, elbow extension -20 Goal status: INITIAL  6.  Pt will demonstrate ability to retrieve lightweight object at 110* shoulder flexion and -15 elbow extension with LUE Baseline: shoulder flexion 105, elbow extension -20  Goal status: inital  ASSESSMENT:  CLINICAL IMPRESSION: *** Pt progressing towards goals. Pt limited by cognitive and visual/perceptual deficits.  Pt benefits from questioning cueing.    PERFORMANCE DEFICITS: in functional skills including ADLs, IADLs, coordination, dexterity, proprioception, sensation, tone, ROM, strength, flexibility, Fine motor  control, Gross motor control, mobility, balance, endurance, decreased knowledge of precautions, decreased knowledge of use of DME, vision, and UE functional use, cognitive skills including energy/drive, learn, memory, problem solving, safety awareness, thought, and understand, and psychosocial skills including coping strategies, environmental adaptation, habits, interpersonal interactions, and routines and behaviors.   IMPAIRMENTS: are limiting patient from ADLs, IADLs, rest and sleep, play, leisure, and social participation.   CO-MORBIDITIES: may have co-morbidities  that affects occupational performance. Patient will benefit from skilled OT to address above impairments and improve overall function.  MODIFICATION OR ASSISTANCE TO COMPLETE EVALUATION: No modification of tasks or assist necessary to complete an evaluation.  OT OCCUPATIONAL PROFILE AND HISTORY: Detailed assessment: Review of records and additional review of physical, cognitive, psychosocial history related to current functional performance.  CLINICAL DECISION MAKING: LOW - limited treatment options, no task modification necessary  REHAB POTENTIAL: Good  EVALUATION COMPLEXITY: Low   PLAN:  OT FREQUENCY: 2x/week  OT DURATION: 12 weeks  PLANNED INTERVENTIONS: self care/ADL training, therapeutic exercise, therapeutic activity, neuromuscular re-education, manual therapy, passive range of motion, gait training, balance training, stair training, functional mobility training, aquatic therapy, splinting, paraffin, fluidotherapy, moist heat, cryotherapy, contrast bath, patient/family education, cognitive remediation/compensation, visual/perceptual remediation/compensation, energy conservation, coping strategies training, DME and/or AE instructions, and Re-evaluation  RECOMMENDED OTHER SERVICES: PT, ST  CONSULTED AND AGREED WITH PLAN OF CARE: Patient and family member/caregiver  PLAN FOR NEXT SESSION:  *** check STGs, ?simple  environmental scanning    Triva Hueber, OTR/L 06/12/2022, 2:19 PM

## 2022-06-12 NOTE — Patient Instructions (Signed)
Hypertension, Adult High blood pressure (hypertension) is when the force of blood pumping through the arteries is too strong. The arteries are the blood vessels that carry blood from the heart throughout the body. Hypertension forces the heart to work harder to pump blood and may cause arteries to become narrow or stiff. Untreated or uncontrolled hypertension can lead to a heart attack, heart failure, a stroke, kidney disease, and other problems. A blood pressure reading consists of a higher number over a lower number. Ideally, your blood pressure should be below 120/80. The first ("top") number is called the systolic pressure. It is a measure of the pressure in your arteries as your heart beats. The second ("bottom") number is called the diastolic pressure. It is a measure of the pressure in your arteries as the heart relaxes. What are the causes? The exact cause of this condition is not known. There are some conditions that result in high blood pressure. What increases the risk? Certain factors may make you more likely to develop high blood pressure. Some of these risk factors are under your control, including: Smoking. Not getting enough exercise or physical activity. Being overweight. Having too much fat, sugar, calories, or salt (sodium) in your diet. Drinking too much alcohol. Other risk factors include: Having a personal history of heart disease, diabetes, high cholesterol, or kidney disease. Stress. Having a family history of high blood pressure and high cholesterol. Having obstructive sleep apnea. Age. The risk increases with age. What are the signs or symptoms? High blood pressure may not cause symptoms. Very high blood pressure (hypertensive crisis) may cause: Headache. Fast or irregular heartbeats (palpitations). Shortness of breath. Nosebleed. Nausea and vomiting. Vision changes. Severe chest pain, dizziness, and seizures. How is this diagnosed? This condition is diagnosed by  measuring your blood pressure while you are seated, with your arm resting on a flat surface, your legs uncrossed, and your feet flat on the floor. The cuff of the blood pressure monitor will be placed directly against the skin of your upper arm at the level of your heart. Blood pressure should be measured at least twice using the same arm. Certain conditions can cause a difference in blood pressure between your right and left arms. If you have a high blood pressure reading during one visit or you have normal blood pressure with other risk factors, you may be asked to: Return on a different day to have your blood pressure checked again. Monitor your blood pressure at home for 1 week or longer. If you are diagnosed with hypertension, you may have other blood or imaging tests to help your health care provider understand your overall risk for other conditions. How is this treated? This condition is treated by making healthy lifestyle changes, such as eating healthy foods, exercising more, and reducing your alcohol intake. You may be referred for counseling on a healthy diet and physical activity. Your health care provider may prescribe medicine if lifestyle changes are not enough to get your blood pressure under control and if: Your systolic blood pressure is above 130. Your diastolic blood pressure is above 80. Your personal target blood pressure may vary depending on your medical conditions, your age, and other factors. Follow these instructions at home: Eating and drinking  Eat a diet that is high in fiber and potassium, and low in sodium, added sugar, and fat. An example of this eating plan is called the DASH diet. DASH stands for Dietary Approaches to Stop Hypertension. To eat this way: Eat   plenty of fresh fruits and vegetables. Try to fill one half of your plate at each meal with fruits and vegetables. Eat whole grains, such as whole-wheat pasta, brown rice, or whole-grain bread. Fill about one  fourth of your plate with whole grains. Eat or drink low-fat dairy products, such as skim milk or low-fat yogurt. Avoid fatty cuts of meat, processed or cured meats, and poultry with skin. Fill about one fourth of your plate with lean proteins, such as fish, chicken without skin, beans, eggs, or tofu. Avoid pre-made and processed foods. These tend to be higher in sodium, added sugar, and fat. Reduce your daily sodium intake. Many people with hypertension should eat less than 1,500 mg of sodium a day. Do not drink alcohol if: Your health care provider tells you not to drink. You are pregnant, may be pregnant, or are planning to become pregnant. If you drink alcohol: Limit how much you have to: 0-1 drink a day for women. 0-2 drinks a day for men. Know how much alcohol is in your drink. In the [place] one drink equals one 12 oz bottle of beer (355 mL), one 5 oz glass of wine (148 mL), or one 1 oz glass of hard liquor (44 mL). Lifestyle  Work with your health care provider to maintain a healthy body weight or to lose weight. Ask what an ideal weight is for you. Get at least 30 minutes of exercise that causes your heart to beat faster (aerobic exercise) most days of the week. Activities may include walking, swimming, or biking. Include exercise to strengthen your muscles (resistance exercise), such as Pilates or lifting weights, as part of your weekly exercise routine. Try to do these types of exercises for 30 minutes at least 3 days a week. Do not use any products that contain nicotine or tobacco. These products include cigarettes, chewing tobacco, and vaping devices, such as e-cigarettes. If you need help quitting, ask your health care provider. Monitor your blood pressure at home as told by your health care provider. Keep all follow-up visits. This is important. Medicines Take over-the-counter and prescription medicines only as told by your health care provider. Follow directions carefully. Blood  pressure medicines must be taken as prescribed. Do not skip doses of blood pressure medicine. Doing this puts you at risk for problems and can make the medicine less effective. Ask your health care provider about side effects or reactions to medicines that you should watch for. Contact a health care provider if you: Think you are having a reaction to a medicine you are taking. Have headaches that keep coming back (recurring). Feel dizzy. Have swelling in your ankles. Have trouble with your vision. Get help right away if you: Develop a severe headache or confusion. Have unusual weakness or numbness. Feel faint. Have severe pain in your chest or abdomen. Vomit repeatedly. Have trouble breathing. These symptoms may be an emergency. Get help right away. Call 911. Do not wait to see if the symptoms will go away. Do not drive yourself to the hospital. Summary Hypertension is when the force of blood pumping through your arteries is too strong. If this condition is not controlled, it may put you at risk for serious complications. Your personal target blood pressure may vary depending on your medical conditions, your age, and other factors. For most people, a normal blood pressure is less than 120/80. Hypertension is treated with lifestyle changes, medicines, or a combination of both. Lifestyle changes include losing weight, eating a healthy,   low-sodium diet, exercising more, and limiting alcohol. This information is not intended to replace advice given to you by your health care provider. Make sure you discuss any questions you have with your health care provider. Document Revised: 03/08/2021 Document Reviewed: 03/08/2021 Elsevier Patient Education  2023 [employer]  

## 2022-06-13 ENCOUNTER — Ambulatory Visit: Payer: Medicare Other | Admitting: Speech Pathology

## 2022-06-13 ENCOUNTER — Ambulatory Visit: Payer: Medicare Other | Admitting: Physical Therapy

## 2022-06-13 ENCOUNTER — Other Ambulatory Visit: Payer: Self-pay | Admitting: Internal Medicine

## 2022-06-13 ENCOUNTER — Ambulatory Visit: Payer: Medicare Other | Admitting: Occupational Therapy

## 2022-06-13 DIAGNOSIS — N3281 Overactive bladder: Secondary | ICD-10-CM

## 2022-06-13 MED ORDER — SOLIFENACIN SUCCINATE 5 MG PO TABS: 5.0000 mg | ORAL_TABLET | Freq: Every day | ORAL | 1 refills | Status: DC

## 2022-06-14 NOTE — Therapy (Unsigned)
OUTPATIENT SPEECH LANGUAGE PATHOLOGY TREATMENT   Patient Name: Antonio Adams MRN: [id number] DOB:November 10, 1945, 77 y.o., male Today's Date: 06/15/2022  PCP: [name]., MD REFERRING PROVIDER: Hoyt Koch., MD  END OF SESSION:  End of Session - 06/15/22 0930     Visit Number 6    Number of Visits 25    Date for SLP Re-Evaluation 07/26/22    Authorization Type [employer] Medicare    SLP Start Time 0930    SLP Stop Time  2426    SLP Time Calculation (min) 45 min    Activity Tolerance Patient tolerated treatment well                Past Medical History:  Diagnosis Date   ADENOCARCINOMA, PROSTATE, GLEASON GRADE 6 12/21/2009   prostate cancer   ALLERGIC RHINITIS 03/16/2007   Allergy    Anxiety    Arthritis    back    BACK PAIN WITH RADICULOPATHY 11/23/2008   Cancer of kidney ([place])    partial right kidney removed   CARPAL TUNNEL SYNDROME, BILATERAL 03/16/2007   Chronic back pain    Constipation    takes Senokot daily   Depression    occasionally   DIABETES MELLITUS, TYPE II 03/16/2007   takes Januvia and MEtformin daily   GASTROINTESTINAL HEMORRHAGE, HX OF 03/16/2007   GERD 03/16/2007   takes Omeprazole daily   Glaucoma    mild - no eye drops   Hemorrhoids    History of colon polyps    HYPERLIPIDEMIA 03/16/2007   takes Crestor daily   HYPERTENSION 03/16/2007   takes Diltiazem and Lisinopril daily    Kidney cancer, primary, with metastasis from kidney to other site [facility])    LEG CRAMPS 05/22/2007   Nocturia    Overactive bladder    PARESTHESIA 05/21/2007   Proctitis    PTSD (post-traumatic stress disorder)    wakes up may be dreaming of fighting   Rectal bleeding    Dr.Norins has explained its from the Radiation that he has received   Rectal bleeding    Renal cell carcinoma ([place])    Sleep apnea    uses CPAP nightly   Stomach cancer ([place])    Urinary frequency    takes Toviaz daily   Past Surgical History:  Procedure Laterality Date   ADRENALECTOMY  Right 02/26/2020   Procedure: OPEN RIGHT RENAL CELL METASTATECTOMY AND  ADRENALECTOMY;  Surgeon: [name], MD;  Location: [facility];  Service: Urology;  Laterality: Right;   [place], 2008   repeat surgery; ESI '08   [place]     bilateral   CERVICAL FUSION     COLONOSCOPY     colonosocpy     ESOPHAGOGASTRODUODENOSCOPY     ED   FLEXIBLE SIGMOIDOSCOPY N/A 01/08/2017   Procedure: FLEXIBLE SIGMOIDOSCOPY;  Surgeon: Irene Shipper, MD;  Location: [place];  Service: Endoscopy;  Laterality: N/A;   HOT HEMOSTASIS N/A 01/08/2017   Procedure: HOT HEMOSTASIS (ARGON PLASMA COAGULATION/BICAP);  Surgeon: Irene Shipper, MD;  Location: [facility] ENDOSCOPY;  Service: Endoscopy;  Laterality: N/A;   IR RADIOLOGIST EVAL & MGMT  12/17/2019   IR RADIOLOGIST EVAL & MGMT  07/01/2020   IR RADIOLOGIST EVAL & MGMT  08/25/2020   LUMBAR LAMINECTOMY  10/30/2011   Procedure: MICRODISCECTOMY LUMBAR LAMINECTOMY;  Surgeon: Jessy Oto, MD;  Location: [place];  Service: Orthopedics;  Laterality: Right;  Right L4-5 and L5-S1 Microdiscectomy   LUMBAR  LAMINECTOMY  06/17/2012   Procedure: MICRODISCECTOMY LUMBAR LAMINECTOMY;  Surgeon: Jessy Oto, MD;  Location: [place];  Service: Orthopedics;  Laterality: N/A;  Right L5-S1 microdiscectomy   OPEN PARTIAL HEPATECTOMY  N/A 02/26/2020   Procedure: OPEN PARTIAL HEPATECTOMY, CHOLECYSTECTOMY;  Surgeon: Stark Klein, MD;  Location: [facility];  Service: General;  Laterality: N/A;   POLYPECTOMY     PROSTATE SURGERY  march 2012   seed implant   RADIOFREQUENCY ABLATION N/A 07/21/2020   Procedure: CT MICROWAVE ABLATION;  Surgeon: Criselda Peaches, MD;  Location: [facility];  Service: Anesthesiology;  Laterality: N/A;   ROBOTIC ASSITED PARTIAL NEPHRECTOMY Right 09/08/2015   Procedure: XI ROBOTIC ASSITED RIGHT PARTIAL NEPHRECTOMY;  Surgeon: [name], MD;  Location: [facility];  Service: Urology;  Laterality: Right;  Clamp on: 1033 Clamp off: 1103 Total Clamp Time: 30  minutes   SHOULDER ARTHROSCOPY WITH SUBACROMIAL DECOMPRESSION Left 01/20/2016   Procedure: LEFT SHOULDER ARTHROSCOPY WITH EXTENSIVE  DEBRIDEMENT, ACROMIOPLASTY;  Surgeon: Ninetta Lights, MD;  Location: [place];  Service: Orthopedics;  Laterality: Left;   SHOULDER SURGERY  LFT   Stress Cardiolite  12/24/2003   negative for ischemia   UPPER ESOPHAGEAL ENDOSCOPIC ULTRASOUND (EUS)  04/18/2016   [facility]   UPPER GASTROINTESTINAL ENDOSCOPY     WOUND EXPLORATION     management of a wound that he sustained in the [place]   Patient Active Problem List   Diagnosis Date Noted   Vaccine for VZV (varicella-zoster virus) 06/12/2022   Hx of completed stroke 04/28/2022   Abnormal brain CT 04/07/2022   Encounter for general adult medical examination with abnormal findings 01/23/2022   Anemia due to zinc deficiency 12/20/2021   Low back pain 12/15/2021   Symptomatic anemia 12/15/2021   Cancer related pain 12/15/2021   Chronic heart failure with preserved ejection fraction (HFpEF) ([place]) 08/12/2021   Tinea cruris 08/12/2021   Anemia due to chronic kidney disease 07/20/2021   CKD (chronic kidney disease) stage 5, GFR less than 15 ml/min (HCC) 05/24/2021   BPH (benign prostatic hyperplasia) 05/24/2021   Diabetic renal disease ([place]) 05/20/2021   Dry eye syndrome of bilateral lacrimal glands 05/20/2021   Senile nuclear sclerosis 05/20/2021   Vitamin D deficiency 05/20/2021   Vitreous degeneration, unspecified eye 05/20/2021   Intrinsic eczema 04/18/2021   Atherosclerosis of aorta ([place]) 12/30/2020   Mixed conductive and sensorineural hearing loss of left ear with restricted hearing of right ear 09/01/2020   Tinnitus of both ears 05/20/2020   Metastatic renal cell carcinoma ([place]) 02/26/2020   Diabetic polyneuropathy associated with type 2 diabetes mellitus ([place]) 12/18/2019   Murmur, cardiac 10/08/2018   DDD (degenerative disc disease), cervical 10/08/2018   OAB (overactive  bladder) 04/04/2018   Seasonal allergic rhinitis due to pollen 11/01/2016   Microalbuminuria due to type 2 diabetes mellitus ([place]) 07/27/2016   Varicose veins of both lower extremities 08/06/2015   Varicose veins of lower extremities with ulcer ([place]) 06/16/2015   Internal hemorrhoid, bleeding 08/03/2014   Post traumatic stress disorder (PTSD) 01/29/2014   Spinal stenosis of lumbar region with radiculopathy 10/30/2011    Class: Chronic   Obstructive sleep apnea 11/24/2010   Obesity 11/24/2010   Routine general medical examination at a health care facility 11/24/2010   ADENOCARCINOMA, PROSTATE, GLEASON GRADE 6 12/21/2009   Type 2 diabetes mellitus with diabetic cataract ([place]) 03/16/2007   Essential hypertension 03/16/2007   GERD 03/16/2007    ONSET DATE: 04-07-22   REFERRING  DIAG: Z21.73 (ICD-10-CM) - Personal history of transient ischemic attack (TIA), and cerebral infarction without residual deficits   THERAPY DIAG:  Cognitive communication deficit  Rationale for Evaluation and Treatment: Rehabilitation  SUBJECTIVE:   SUBJECTIVE STATEMENT: "I'm learning"  PAIN: Are you having pain? No  OBJECTIVE:    TODAY'S TREATMENT:   06-15-22: Reports improved sleeping habits secondary to recommendations from primary SLP, which has improved cognitive functioning during the day. Good carryover of medication recommendations reported. Pt provided pill box as example with AM medication taken. Discussed cognitive strategies for bill management, including writing date of calendar when bill arrives. Pt and fiance verbalized agreement. Suspects reduced intellectual and emergent awareness, as pt reported some simple mistakes during usual routine. Pt often attempted to explain away deficits when possible deficit identified. Recommended extra processing time prior to initiating task ("stop and think") and double checking during/after task to ensure accurate completion. See patient instructions with recs.    06-08-22: Pt has implemented some sleep hygiene strategies discussed prior session and reports to having 2 night of improved rest. Reports to feeling "much better. It makes a big difference." SLP brings awareness to improved sleep leading to pt being able to prepare self for the day and take medication yesterday and this morning. SLP provided direct instruction on benefit of ID challenges and then determining the HOW for improved completion/participation (meta-cognitive strategy instruction). Example given of how pt was able to have light breakfast this AM since he had previously made a plan for easy meals on therapy mornings. SLP probes how else meta-cognitive strategy can be employed with pt ID orientation to time of day as current challenge (e.g. when waking from nap will think it's next day). SLP provided suggestion for large print digital clock with time, day, time of day and showed were can be purchased. Pt ID living room and bedroom would be good places to keep clocks to A in orientation.   06-06-22: Pt completed medication chart, with verification of accuracy by s/o, Antonio Adams. ID challenge of taking morning medication per advised time d/t difficulty getting sleep. Pt reports is waking around 3 AM and is unable to fall asleep until early morning which then results in sleeping until noon. Despite being awake early this morning for therapy, did not take meds d/t decreased appetite and time for breakfast. SLP A pt in generating list of quick and simple morning meals pt could try to facilitate medication administration on days that he must be up and out of door early. Pt generates x3 items with mod-I. SLP advises on sleep hygiene habits which may aid in improved sleep duration and quality-- create sleep pressure by sticking to routine, turn off tv in bedroom, creating nighttime routine, viewing morning sunlight for 5-10 minutes on sunny days, longer on cloudy days. Education on role of sleep in clarity of  thought, encoding memories, and decision making.   05-30-22: Pt accompanied by spouse this session. Further CLQT eval completed this date. SLP observed pt difficulty with problem solving, organization, and planning during maze portion. Difficulty following directions during pattern creation task.   SLP introduced utilization of external aids/strategies (pill box, visual schedule) for medication management. Pt unable to recall any of his medications and required max A to add 8 medications to chart. Given name of medication, pt provided purpose of med in 1/6 opportunities. Demonstrates deviation in attention and working memory during completion of task.   05-25-22: PT ambulates into ST office, with occasional demonstration of impaired balance.  Pt denies headache, speech presents as WNL. SLP takes pt's BP, with initial reading of 161/91, subsequent reading after 10 minute latency period of 162/94. Pt reports did not take morning medications d/t not having time for breakfast. SLP advised pt will not be able to be seen for PT d/t raised BP and pt should routinely take medications at least 1 hr prior to therapy appointments. Pt and s/o verbalize agreement and undertanding. Pt agreeable to completion of PROM. SLP A in completion of Cognitive Function PROM with approx half completed. SLP elected to cease administration d/t pt selecting "0" or "none" for all items despite s/o providing examples of impairments evidenced. Instead asked for input from s/o of functional goals with the following ID: Med administration adherence Med sorting Orientation to daily tasks and schedule  Memory for intended actions or verbal expression Ability to navigate to and from places in community  Will plan to complete standardized cognitive assessment at next session when pt's BP is controled.   05-03-22: Education provided on evaluation results and SLP's recommendations. Initiated training re: orientation aids. Pt verbalizes  agreement with POC, all questions answered to satisfaction.    PATIENT EDUCATION: Education details: see above Person educated: Patient and Spouse Education method: Explanation, Demonstration, and Verbal cues Education comprehension: verbalized understanding, returned demonstration, and needs further education   GOALS: Goals reviewed with patient? Yes  SHORT TERM GOALS: Target date: 05-31-22 (addressed today)  Pt will complete CLQT and PROM initial therapy session Baseline: Goal status: MET  2.  Pt will accurately sort medications with use of compensations PRN into pill box with occasional mod-A Baseline:  Goal status: MET  3.  Pt will generate visual aid to assist with increased independence in paying bills with A from SLP and s/o Baseline:  Goal status: PARTIALLY MET  4.  Pt will verbalize appropriate memory strategy given functional scenario with 80% accuracy   Baseline:  Goal status: NOT MET  5.  Pt and s/o will implement external A for orientation (e.g. calendar) with usual cues from care partner to initiate use  Baseline:  Goal status: PARTIALLY MET   LONG TERM GOALS: Target date: 07-26-22  With use of external aid, pt will answer orientation questions with 100% accuracy over 2 sessions Baseline:  Goal status: INITIAL  2.  Pt will verbalize x3 situations in which he successfully employed a memory strategy resulting in subjective satisfaction with ability to recall desired information Baseline:  Goal status: INITIAL  3.  Pt will pay x3 of own bills with supervision from s/o, with use of compensations PRN Baseline:  Goal status: INITIAL  4.  Pt will report improvement via PROM by dc Baseline:  Goal status: INITIAL  ASSESSMENT:  CLINICAL IMPRESSION: Patient is a 77 y.o. M who was seen today for cognitive linguistic evaluation s/p stroke. Mr. Dorko presents with moderate cognitive impairment in areas of orientation, awareness, memory, executive  functioning. Language marked as severe per CLQT, however pt and caregiver insistent that communication not impacted by stroke. Overall reduced verbal output and resistant to speak during tx in response to SLP questions/comments. Pt now requiring less assistance from significant other to manage medication, schedule, and finances. Prior to stroke pt was fully independent. Pt would benefit from skilled ST to facilitate increased independence, enhance QoL, and reduced caregiver burden.    OBJECTIVE IMPAIRMENTS: include attention, memory, awareness, executive functioning, and expressive language. These impairments are limiting patient from managing medications, managing appointments, managing finances, household responsibilities, and  ADLs/IADLs. Factors affecting potential to achieve goals and functional outcome are ability to learn/carryover information and cooperation/participation level.. Patient will benefit from skilled SLP services to address above impairments and improve overall function.  REHAB POTENTIAL: Fair (carryover and participation)  PLAN:  SLP FREQUENCY: 2x/week  SLP DURATION: 12 weeks  PLANNED INTERVENTIONS: Cueing hierachy, Cognitive reorganization, Internal/external aids, Functional tasks, SLP instruction and feedback, Compensatory strategies, and Patient/family education    Marzetta Board, CCC-SLP 06/15/2022, 9:30 AM

## 2022-06-15 ENCOUNTER — Ambulatory Visit: Payer: Medicare Other | Admitting: Occupational Therapy

## 2022-06-15 ENCOUNTER — Ambulatory Visit: Payer: Medicare Other

## 2022-06-15 ENCOUNTER — Encounter: Payer: Self-pay | Admitting: Physical Therapy

## 2022-06-15 ENCOUNTER — Ambulatory Visit: Payer: Medicare Other | Attending: Internal Medicine | Admitting: Physical Therapy

## 2022-06-15 VITALS — BP 163/96 | HR 59

## 2022-06-15 DIAGNOSIS — R2689 Other abnormalities of gait and mobility: Secondary | ICD-10-CM

## 2022-06-15 DIAGNOSIS — R41844 Frontal lobe and executive function deficit: Secondary | ICD-10-CM | POA: Diagnosis present

## 2022-06-15 DIAGNOSIS — R41842 Visuospatial deficit: Secondary | ICD-10-CM | POA: Diagnosis present

## 2022-06-15 DIAGNOSIS — R278 Other lack of coordination: Secondary | ICD-10-CM

## 2022-06-15 DIAGNOSIS — M6281 Muscle weakness (generalized): Secondary | ICD-10-CM

## 2022-06-15 DIAGNOSIS — R41841 Cognitive communication deficit: Secondary | ICD-10-CM

## 2022-06-15 DIAGNOSIS — R2681 Unsteadiness on feet: Secondary | ICD-10-CM | POA: Diagnosis not present

## 2022-06-15 DIAGNOSIS — R41840 Attention and concentration deficit: Secondary | ICD-10-CM | POA: Insufficient documentation

## 2022-06-15 NOTE — Therapy (Signed)
Pt was not seen due to elevated BP while in PT. See PT note for details.  Theone Murdoch, OTR/L [phone] Phone: [phone] 11:54 AM 06/15/22

## 2022-06-15 NOTE — Patient Instructions (Addendum)
Recommendations:  Write bill dates on calendar so you didn't miss them  Stop and think before you act  Ex: look at the microwave timer before you press start  Double checking is important!  If you notice a change in your thinking (even if it's minor), figure out a solution

## 2022-06-15 NOTE — Therapy (Signed)
OUTPATIENT PHYSICAL THERAPY NEURO TREATMENT   Patient Name: Antonio Adams MRN: [id number] DOB:1945-08-21, 77 y.o., male Today's Date: 06/15/2022   PCP: Scarlette Calico, MD REFERRING PROVIDER: Pricilla Holm, MD  END OF SESSION:  PT End of Session - 06/15/22 1017     Visit Number 6    Number of Visits 17    Date for PT Re-Evaluation 06/26/22    Authorization Type [employer] Medicare    PT Start Time 1018   pt in restroom following handoff from [place]   PT Stop Time 1040    PT Time Calculation (min) 22 min    Equipment Utilized During Treatment Gait belt    Activity Tolerance Patient tolerated treatment well;Other (comment)   Elevated diastolic   Behavior During Therapy WFL for tasks assessed/performed             Past Medical History:  Diagnosis Date   ADENOCARCINOMA, PROSTATE, GLEASON GRADE 6 12/21/2009   prostate cancer   ALLERGIC RHINITIS 03/16/2007   Allergy    Anxiety    Arthritis    back    BACK PAIN WITH RADICULOPATHY 11/23/2008   Cancer of kidney ([place])    partial right kidney removed   CARPAL TUNNEL SYNDROME, BILATERAL 03/16/2007   Chronic back pain    Constipation    takes Senokot daily   Depression    occasionally   DIABETES MELLITUS, TYPE II 03/16/2007   takes Januvia and MEtformin daily   GASTROINTESTINAL HEMORRHAGE, HX OF 03/16/2007   GERD 03/16/2007   takes Omeprazole daily   Glaucoma    mild - no eye drops   Hemorrhoids    History of colon polyps    HYPERLIPIDEMIA 03/16/2007   takes Crestor daily   HYPERTENSION 03/16/2007   takes Diltiazem and Lisinopril daily    Kidney cancer, primary, with metastasis from kidney to other site [facility])    LEG CRAMPS 05/22/2007   Nocturia    Overactive bladder    PARESTHESIA 05/21/2007   Proctitis    PTSD (post-traumatic stress disorder)    wakes up may be dreaming of fighting   Rectal bleeding    Dr.Norins has explained its from the Radiation that he has received   Rectal bleeding    Renal cell carcinoma ([place])     Sleep apnea    uses CPAP nightly   Stomach cancer ([place])    Urinary frequency    takes Toviaz daily   Past Surgical History:  Procedure Laterality Date   ADRENALECTOMY Right 02/26/2020   Procedure: OPEN RIGHT RENAL CELL METASTATECTOMY AND  ADRENALECTOMY;  Surgeon: [name], MD;  Location: [facility];  Service: Urology;  Laterality: Right;   [place], 2008   repeat surgery; ESI '08   [place]     bilateral   CERVICAL FUSION     COLONOSCOPY     colonosocpy     ESOPHAGOGASTRODUODENOSCOPY     ED   FLEXIBLE SIGMOIDOSCOPY N/A 01/08/2017   Procedure: FLEXIBLE SIGMOIDOSCOPY;  Surgeon: Irene Shipper, MD;  Location: [place];  Service: Endoscopy;  Laterality: N/A;   HOT HEMOSTASIS N/A 01/08/2017   Procedure: HOT HEMOSTASIS (ARGON PLASMA COAGULATION/BICAP);  Surgeon: Irene Shipper, MD;  Location: [facility] ENDOSCOPY;  Service: Endoscopy;  Laterality: N/A;   IR RADIOLOGIST EVAL & MGMT  12/17/2019   IR RADIOLOGIST EVAL & MGMT  07/01/2020   IR RADIOLOGIST EVAL & MGMT  08/25/2020   LUMBAR LAMINECTOMY  10/30/2011   Procedure: MICRODISCECTOMY  LUMBAR LAMINECTOMY;  Surgeon: Jessy Oto, MD;  Location: [place];  Service: Orthopedics;  Laterality: Right;  Right L4-5 and L5-S1 Microdiscectomy   LUMBAR LAMINECTOMY  06/17/2012   Procedure: MICRODISCECTOMY LUMBAR LAMINECTOMY;  Surgeon: Jessy Oto, MD;  Location: [place];  Service: Orthopedics;  Laterality: N/A;  Right L5-S1 microdiscectomy   OPEN PARTIAL HEPATECTOMY  N/A 02/26/2020   Procedure: OPEN PARTIAL HEPATECTOMY, CHOLECYSTECTOMY;  Surgeon: Stark Klein, MD;  Location: [facility];  Service: General;  Laterality: N/A;   POLYPECTOMY     PROSTATE SURGERY  march 2012   seed implant   RADIOFREQUENCY ABLATION N/A 07/21/2020   Procedure: CT MICROWAVE ABLATION;  Surgeon: Criselda Peaches, MD;  Location: [facility];  Service: Anesthesiology;  Laterality: N/A;   ROBOTIC ASSITED PARTIAL NEPHRECTOMY Right 09/08/2015   Procedure: XI ROBOTIC ASSITED  RIGHT PARTIAL NEPHRECTOMY;  Surgeon: [name], MD;  [facility];  Service: Urology;  Laterality: Right;  Clamp on: 1033 Clamp off: 1103 Total Clamp Time: 30 minutes   SHOULDER ARTHROSCOPY WITH SUBACROMIAL DECOMPRESSION Left 01/20/2016   Procedure: LEFT SHOULDER ARTHROSCOPY WITH EXTENSIVE  DEBRIDEMENT, ACROMIOPLASTY;  Surgeon: Ninetta Lights, MD;  Location: [place];  Service: Orthopedics;  Laterality: Left;   SHOULDER SURGERY  LFT   Stress Cardiolite  12/24/2003   negative for ischemia   UPPER ESOPHAGEAL ENDOSCOPIC ULTRASOUND (EUS)  04/18/2016   [facility]   UPPER GASTROINTESTINAL ENDOSCOPY     WOUND EXPLORATION     management of a wound that he sustained in the [place]   Patient Active Problem List   Diagnosis Date Noted   Vaccine for VZV (varicella-zoster virus) 06/12/2022   Hx of completed stroke 04/28/2022   Abnormal brain CT 04/07/2022   Encounter for general adult medical examination with abnormal findings 01/23/2022   Anemia due to zinc deficiency 12/20/2021   Low back pain 12/15/2021   Symptomatic anemia 12/15/2021   Cancer related pain 12/15/2021   Chronic heart failure with preserved ejection fraction (HFpEF) ([place]) 08/12/2021   Tinea cruris 08/12/2021   Anemia due to chronic kidney disease 07/20/2021   CKD (chronic kidney disease) stage 5, GFR less than 15 ml/min (HCC) 05/24/2021   BPH (benign prostatic hyperplasia) 05/24/2021   Diabetic renal disease ([place]) 05/20/2021   Dry eye syndrome of bilateral lacrimal glands 05/20/2021   Senile nuclear sclerosis 05/20/2021   Vitamin D deficiency 05/20/2021   Vitreous degeneration, unspecified eye 05/20/2021   Intrinsic eczema 04/18/2021   Atherosclerosis of aorta ([place]) 12/30/2020   Mixed conductive and sensorineural hearing loss of left ear with restricted hearing of right ear 09/01/2020   Tinnitus of both ears 05/20/2020   Metastatic renal cell carcinoma ([place]) 02/26/2020   Diabetic  polyneuropathy associated with type 2 diabetes mellitus ([place]) 12/18/2019   Murmur, cardiac 10/08/2018   DDD (degenerative disc disease), cervical 10/08/2018   OAB (overactive bladder) 04/04/2018   Seasonal allergic rhinitis due to pollen 11/01/2016   Microalbuminuria due to type 2 diabetes mellitus ([place]) 07/27/2016   Varicose veins of both lower extremities 08/06/2015   Varicose veins of lower extremities with ulcer ([place]) 06/16/2015   Internal hemorrhoid, bleeding 08/03/2014   Post traumatic stress disorder (PTSD) 01/29/2014   Spinal stenosis of lumbar region with radiculopathy 10/30/2011    Class: Chronic   Obstructive sleep apnea 11/24/2010   Obesity 11/24/2010   Routine general medical examination at a health care facility 11/24/2010   ADENOCARCINOMA, PROSTATE, GLEASON GRADE 6 12/21/2009  Type 2 diabetes mellitus with diabetic cataract ([place]) 03/16/2007   Essential hypertension 03/16/2007   GERD 03/16/2007    ONSET DATE: 04/07/22  REFERRING DIAG: H29.92 (ICD-10-CM) - Hx of completed stroke  THERAPY DIAG:  Other lack of coordination  Muscle weakness (generalized)  Unsteadiness on feet  Other abnormalities of gait and mobility  Rationale for Evaluation and Treatment: Rehabilitation  SUBJECTIVE:                                                                                                                                                                                             SUBJECTIVE STATEMENT: Wife reports he has had his medicine around 9am this morning.  He denies falls or pain today. Pt accompanied by: significant other [place])  PERTINENT HISTORY: LAVON BOTHWELL is a 77 y.o. R-handed male with history of DM2 with diabetic polyneuropathy, OSA on nightly CPAP, PTSD, history of lumbar stenosis with chronic back pain, metastatic renal carcinoma with lymphadenopathy and questionable hepatic involvement noted in 2022, not on chemo due to poor tolerance to the  medications, and without significant progression since May 2023, only on surveillance imaging, stage IV, eczema, vitamin D deficiency, CKD stage V refusing HD, BPH, anemia of CKD  Zinc deficiency, chronic heart failure, mild cognitive impairment, presenting for evaluation of left PCA territory acute CVA.  PAIN:  Are you having pain? No  PRECAUTIONS: Fall  WEIGHT BEARING RESTRICTIONS: No  OBJECTIVE:  TODAY'S TREATMENT:    LUE in sitting prior to session:                                                                                                                            Today's Vitals   06/15/22 1020 06/15/22 1022  BP: (!) 172/94 (!) 163/96  Pulse: 63 (!) 59   10MWT trials for gait speed using SPC: -16.97 seconds -17.69 seconds -15.46 seconds -14.43 seconds -13.84 seconds  -Retro-stepping 2x20' w/ SBA and cuing for increased step size and width of BOS -Rest provided following 2 tasks above w/ BP reassessed:  189/102, HR 59 bpm, pt  remains asymptomatic.  Waited 2 minutes then reassessed L BP:  192/103, HR 59 bpm, pt still asymptomatic, session concluded and OT consulted about sending pt home vs being seen for last session.  Pt sent home to monitor BP and eat as he did not eat with meds this morning.  Instructed to contact MD if BP remains elevated for more than a day or so and to seek immediate medical attention if BP/CVA related symptoms arise.  PATIENT EDUCATION: Education details: Continue HEP and focus on LLE clearance during walking with cane at home for improved mechanics. Person educated: Patient and Spouse Education method: Explanation, Demonstration, and Verbal cues Education comprehension: verbalized understanding and needs further education  HOME EXERCISE PROGRAM:   Clinical References  HIP: Extension, Bridging Bilateral    Place feet on surface. Tighten glutes, raise hips up. 12 reps per set, 1 sets per day, 4-5 days per week   Copyright  VHI. All rights  reserved.    HIP: Extension / KNEE: Flexion, Standing    Bend knee, squeeze glutes. Move leg backward. 10 reps per set, 1 sets per day, 4-5 days per week Hold onto a support.  Copyright  VHI. All rights reserved.    Band Walk: Side Stepping    Tie band around legs, just above knees. Step 10 feet to one side, then step back to start. Repeat 40 feet per session. Note: Small towel between band and skin eases rubbing.   http://plyo.exer.us/76   Copyright  VHI. All rights reserved.    Marching In-Place    Standing straight, alternate bringing knees toward trunk. Arms swing alternately. Do 2 sets. Do 20 times per day.  Copyright  VHI. All rights reserved.    Sit to Stand / Stand to Sit / Transfers    Sit on edge of a solid chair with arms, feet flat on floor. Lean forward over feet and stand up with hands on chair arms. Sit down slowly with hands on chair arms. Repeat 10 times per session. Do 1-2 sessions per day.  Copyright  VHI. All rights reserved.    Access Code: [email or web address] URL: [email or web address] Date: 06/01/2022 Prepared by: Elease Etienne  Exercises - Standing Single Leg Stance with Counter Support  - 1 x daily - 4 x weekly - 1 sets - 2 reps - 30 seconds hold - Standing Tandem Balance with Counter Support  - 1 x daily - 4 x weekly - 1 sets - 2 reps - 30 seconds hold - Corner Balance Feet Together With Eyes Closed  - 1 x daily - 5 x weekly - 1 sets - 2-3 reps - 30 seconds hold  GOALS: Goals reviewed with patient? Yes  SHORT TERM GOALS: Target date: 05/26/22  Pt will be IND with initial HEP in order to indicate improved functional mobility and dec fall risk. Baseline:  Pt states compliance and no issues (1/18) Goal status: MET  2.  Pt will improve gait speed to >/=2.62 ft/sec w/ SPC in order to indicate dec fall risk.  Baseline: 2.00 ft/sec; 1.90 ft/sec (1/18) Goal status: NOT MET  3.  Pt will ambulate 28' without at mod I level with  only minimal Trendelenburg in order to indicate more independent household ambulation.   Baseline: ongoing compensated right trendelenburg x200' SBA (1/18) Goal status: IN PROGRESS  4.  Pt will improve FGA to >/=18/30 in order to indicate dec fall risk.   Baseline: 12/30; 11/30 (1/23) Goal status: NOT MET  5.  Pt will  improve 5TSS to </=30 secs without UE support only in order to indicate dec fall risk and improved functional strength.   Baseline: approx 37 secs with hands on lap and mild anterior LOB; 26.59 sec no UE support or LOB (1/18) Goal status: MET  6.  Pt will negotiate up/down 4 steps with single rail/cane, up/down ramp and curb and ambulate x 500' outdoors over unlevel paved surfaces at S level in order to indicate improved community mobility.   Baseline: 4 stairs right rail, curb and ramp SBA (1/18); pt declines to ambulate outside due to cold weather (1/23) Goal status: IN PROGRESS  LONG TERM GOALS: Target date: 06/26/22  Pt will be IND with final HEP in order to indicate improved functional mobility and dec fall risk. Baseline:  Goal status: INITIAL  2.  Pt will improve FGA to >/=23/30 in order to indicate dec fall risk.   Baseline:  Goal status: INITIAL  3.  Pt will improve 5TSS to </=20 secs without UE support only in order to indicate dec fall risk and improved functional strength.   Baseline:  Goal status: INITIAL  4.  Pt will improve gait speed to >/=2.62 ft/sec w/o AD in order to indicate dec fall risk.  Baseline:  Goal status: INITIAL  5.  Pt will ambulate 230' without AD at mod I level in order to indicate more independent household ambulation.   Baseline:  Goal status: INITIAL  6.  Pt will negotiate up/down 4 steps with single rail, up/down ramp and curb and ambulate x 500' outdoors over unlevel paved surfaces without device at S level in order to indicate improved community mobility.   Baseline:  Goal status: INITIAL  ASSESSMENT:  CLINICAL  IMPRESSION: Pt presents to session with elevated diastolic BP which is unusual compared to most recent visits and compliance to medicines regimen.  Light standing activity performed to assess stability of BP with BP rising as expected, but outside safe limits.  Session concluded and pt sent home with instructions to monitor and seek medical attention as appropriate.  OBJECTIVE IMPAIRMENTS: Abnormal gait, decreased activity tolerance, decreased balance, decreased endurance, decreased mobility, decreased strength, impaired perceived functional ability, and postural dysfunction.   ACTIVITY LIMITATIONS: carrying, lifting, bending, standing, squatting, stairs, transfers, and locomotion level  PARTICIPATION LIMITATIONS: meal prep, cleaning, medication management, personal finances, driving, and community activity  PERSONAL FACTORS: 3+ comorbidities: see above  are also affecting patient's functional outcome.   REHAB POTENTIAL: Good  CLINICAL DECISION MAKING: Evolving/moderate complexity  EVALUATION COMPLEXITY: Moderate  PLAN:  PT FREQUENCY: 2x/week  PT DURATION: 8 weeks  PLANNED INTERVENTIONS: Therapeutic exercises, Therapeutic activity, Neuromuscular re-education, Balance training, Gait training, Patient/Family education, Self Care, Stair training, Vestibular training, DME instructions, and Aquatic Therapy  PLAN FOR NEXT SESSION: Add to HEP prn for LE strengthening (hips) and also balance, gait with and without cane, dynamic stability, hip extension, narrowed BOS, compliant surfaces, hamstring strength, could try blaze pods   Elease Etienne, PT, DPT [facility] [address] [place], [place], [address] Phone: [phone]   Fax:  [phone] 06/15/22, 10:53 AM

## 2022-06-17 ENCOUNTER — Other Ambulatory Visit: Payer: Self-pay | Admitting: Oncology

## 2022-06-20 ENCOUNTER — Ambulatory Visit: Payer: Medicare Other | Admitting: Occupational Therapy

## 2022-06-20 ENCOUNTER — Ambulatory Visit: Payer: Medicare Other | Admitting: Physical Therapy

## 2022-06-20 ENCOUNTER — Other Ambulatory Visit: Payer: Self-pay | Admitting: Internal Medicine

## 2022-06-20 ENCOUNTER — Encounter: Payer: Self-pay | Admitting: Occupational Therapy

## 2022-06-20 ENCOUNTER — Ambulatory Visit: Payer: Medicare Other | Admitting: Speech Pathology

## 2022-06-20 DIAGNOSIS — R41841 Cognitive communication deficit: Secondary | ICD-10-CM

## 2022-06-20 DIAGNOSIS — M6281 Muscle weakness (generalized): Secondary | ICD-10-CM

## 2022-06-20 DIAGNOSIS — R41842 Visuospatial deficit: Secondary | ICD-10-CM

## 2022-06-20 DIAGNOSIS — R278 Other lack of coordination: Secondary | ICD-10-CM

## 2022-06-20 DIAGNOSIS — R2689 Other abnormalities of gait and mobility: Secondary | ICD-10-CM | POA: Diagnosis not present

## 2022-06-20 DIAGNOSIS — R2681 Unsteadiness on feet: Secondary | ICD-10-CM | POA: Diagnosis not present

## 2022-06-20 DIAGNOSIS — R41840 Attention and concentration deficit: Secondary | ICD-10-CM

## 2022-06-20 NOTE — Therapy (Signed)
OUTPATIENT OCCUPATIONAL THERAPY NEURO TREATMENT  Patient Name: Antonio Adams MRN: [id number] DOB:March 25, 1946, 77 y.o., male Today's Date: 06/20/2022  PCP: Dr. Sharlet Salina REFERRING PROVIDER: Dr. Sharlet Salina  END OF SESSION:  OT End of Session - 06/20/22 1018     Visit Number 6    Number of Visits 25    Date for OT Re-Evaluation 07/26/22    Authorization Type [employer]    Authorization - Visit Number 6    Authorization - Number of Visits 10    Progress Note Due on Visit 10    OT Start Time 1017    OT Stop Time 1100    OT Time Calculation (min) 43 min    Activity Tolerance Patient tolerated treatment well    Behavior During Therapy WFL for tasks assessed/performed             Past Medical History:  Diagnosis Date   ADENOCARCINOMA, PROSTATE, GLEASON GRADE 6 12/21/2009   prostate cancer   ALLERGIC RHINITIS 03/16/2007   Allergy    Anxiety    Arthritis    back    BACK PAIN WITH RADICULOPATHY 11/23/2008   Cancer of kidney ([place])    partial right kidney removed   CARPAL TUNNEL SYNDROME, BILATERAL 03/16/2007   Chronic back pain    Constipation    takes Senokot daily   Depression    occasionally   DIABETES MELLITUS, TYPE II 03/16/2007   takes Januvia and MEtformin daily   GASTROINTESTINAL HEMORRHAGE, HX OF 03/16/2007   GERD 03/16/2007   takes Omeprazole daily   Glaucoma    mild - no eye drops   Hemorrhoids    History of colon polyps    HYPERLIPIDEMIA 03/16/2007   takes Crestor daily   HYPERTENSION 03/16/2007   takes Diltiazem and Lisinopril daily    Kidney cancer, primary, with metastasis from kidney to other site [facility])    LEG CRAMPS 05/22/2007   Nocturia    Overactive bladder    PARESTHESIA 05/21/2007   Proctitis    PTSD (post-traumatic stress disorder)    wakes up may be dreaming of fighting   Rectal bleeding    Dr.Norins has explained its from the Radiation that he has received   Rectal bleeding    Renal cell carcinoma ([place])    Sleep apnea    uses CPAP nightly    Stomach cancer ([place])    Urinary frequency    takes Toviaz daily   Past Surgical History:  Procedure Laterality Date   ADRENALECTOMY Right 02/26/2020   Procedure: OPEN RIGHT RENAL CELL METASTATECTOMY AND  ADRENALECTOMY;  Surgeon: [name], MD;  Location: [facility];  Service: Urology;  Laterality: Right;   [place], 2008   repeat surgery; ESI '08   [place]     bilateral   CERVICAL FUSION     COLONOSCOPY     colonosocpy     ESOPHAGOGASTRODUODENOSCOPY     ED   FLEXIBLE SIGMOIDOSCOPY N/A 01/08/2017   Procedure: FLEXIBLE SIGMOIDOSCOPY;  Surgeon: Irene Shipper, MD;  Location: [place];  Service: Endoscopy;  Laterality: N/A;   HOT HEMOSTASIS N/A 01/08/2017   Procedure: HOT HEMOSTASIS (ARGON PLASMA COAGULATION/BICAP);  Surgeon: Irene Shipper, MD;  Location: [facility] ENDOSCOPY;  Service: Endoscopy;  Laterality: N/A;   IR RADIOLOGIST EVAL & MGMT  12/17/2019   IR RADIOLOGIST EVAL & MGMT  07/01/2020   IR RADIOLOGIST EVAL & MGMT  08/25/2020   LUMBAR LAMINECTOMY  10/30/2011   Procedure:  MICRODISCECTOMY LUMBAR LAMINECTOMY;  Surgeon: Jessy Oto, MD;  Location: [place];  Service: Orthopedics;  Laterality: Right;  Right L4-5 and L5-S1 Microdiscectomy   LUMBAR LAMINECTOMY  06/17/2012   Procedure: MICRODISCECTOMY LUMBAR LAMINECTOMY;  Surgeon: Jessy Oto, MD;  Location: [place];  Service: Orthopedics;  Laterality: N/A;  Right L5-S1 microdiscectomy   OPEN PARTIAL HEPATECTOMY  N/A 02/26/2020   Procedure: OPEN PARTIAL HEPATECTOMY, CHOLECYSTECTOMY;  Surgeon: Stark Klein, MD;  Location: [facility];  Service: General;  Laterality: N/A;   POLYPECTOMY     PROSTATE SURGERY  march 2012   seed implant   RADIOFREQUENCY ABLATION N/A 07/21/2020   Procedure: CT MICROWAVE ABLATION;  Surgeon: Criselda Peaches, MD;  Location: [facility];  Service: Anesthesiology;  Laterality: N/A;   ROBOTIC ASSITED PARTIAL NEPHRECTOMY Right 09/08/2015   Procedure: XI ROBOTIC ASSITED RIGHT PARTIAL NEPHRECTOMY;   Surgeon: [name], MD;  Location: [facility];  Service: Urology;  Laterality: Right;  Clamp on: 1033 Clamp off: 1103 Total Clamp Time: 30 minutes   SHOULDER ARTHROSCOPY WITH SUBACROMIAL DECOMPRESSION Left 01/20/2016   Procedure: LEFT SHOULDER ARTHROSCOPY WITH EXTENSIVE  DEBRIDEMENT, ACROMIOPLASTY;  Surgeon: Ninetta Lights, MD;  Location: [place];  Service: Orthopedics;  Laterality: Left;   SHOULDER SURGERY  LFT   Stress Cardiolite  12/24/2003   negative for ischemia   UPPER ESOPHAGEAL ENDOSCOPIC ULTRASOUND (EUS)  04/18/2016   [facility]   UPPER GASTROINTESTINAL ENDOSCOPY     WOUND EXPLORATION     management of a wound that he sustained in the [place]   Patient Active Problem List   Diagnosis Date Noted   Vaccine for VZV (varicella-zoster virus) 06/12/2022   Hx of completed stroke 04/28/2022   Abnormal brain CT 04/07/2022   Encounter for general adult medical examination with abnormal findings 01/23/2022   Anemia due to zinc deficiency 12/20/2021   Low back pain 12/15/2021   Symptomatic anemia 12/15/2021   Cancer related pain 12/15/2021   Chronic heart failure with preserved ejection fraction (HFpEF) ([place]) 08/12/2021   Tinea cruris 08/12/2021   Anemia due to chronic kidney disease 07/20/2021   CKD (chronic kidney disease) stage 5, GFR less than 15 ml/min (HCC) 05/24/2021   BPH (benign prostatic hyperplasia) 05/24/2021   Diabetic renal disease ([place]) 05/20/2021   Dry eye syndrome of bilateral lacrimal glands 05/20/2021   Senile nuclear sclerosis 05/20/2021   Vitamin D deficiency 05/20/2021   Vitreous degeneration, unspecified eye 05/20/2021   Intrinsic eczema 04/18/2021   Atherosclerosis of aorta ([place]) 12/30/2020   Mixed conductive and sensorineural hearing loss of left ear with restricted hearing of right ear 09/01/2020   Tinnitus of both ears 05/20/2020   Metastatic renal cell carcinoma ([place]) 02/26/2020   Diabetic polyneuropathy associated with type 2  diabetes mellitus ([place]) 12/18/2019   Murmur, cardiac 10/08/2018   DDD (degenerative disc disease), cervical 10/08/2018   OAB (overactive bladder) 04/04/2018   Seasonal allergic rhinitis due to pollen 11/01/2016   Microalbuminuria due to type 2 diabetes mellitus ([place]) 07/27/2016   Varicose veins of both lower extremities 08/06/2015   Varicose veins of lower extremities with ulcer ([place]) 06/16/2015   Internal hemorrhoid, bleeding 08/03/2014   Post traumatic stress disorder (PTSD) 01/29/2014   Spinal stenosis of lumbar region with radiculopathy 10/30/2011    Class: Chronic   Obstructive sleep apnea 11/24/2010   Obesity 11/24/2010   Routine general medical examination at a health care facility 11/24/2010   ADENOCARCINOMA, PROSTATE, GLEASON GRADE 6 12/21/2009  Essential hypertension 03/16/2007   GERD 03/16/2007    ONSET DATE: 04/07/22  REFERRING DIAG: Diagnosis Z86.73 (ICD-10-CM) - Personal history of transient ischemic attack (TIA), and cerebral infarction without residual deficits  THERAPY DIAG:  Other lack of coordination  Muscle weakness (generalized)  Cognitive communication deficit  Visuospatial deficit  Attention and concentration deficit  Rationale for Evaluation and Treatment: Rehabilitation  SUBJECTIVE:   SUBJECTIVE STATEMENT: Pt's fiancee reports the pt typically stays up at night watching tv and looking at his phone. She also reports he used to open a window and sit in the sunlight reading his Bible.   Pt accompanied by: significant other fiancee [name]  PERTINENT HISTORY: 77 y.o. R-handed male with history of DM2 with diabetic polyneuropathy, OSA on nightly CPAP, PTSD, history of lumbar stenosis with chronic back pain, metastatic renal carcinoma with lymphadenopathy and questionable hepatic involvement noted in 2022, not on chemo due to poor tolerance to the medications, and without significant progression since May 2023, only on surveillance imaging, stage IV,  eczema, vitamin D deficiency, CKD stage V refusing HD, BPH, anemia of CKD  Zinc deficiency, chronic heart failure, mild cognitive impairment, s/p left PCA territory acute CVA on 04/07/22   DIAGNOSTIC FINDINGS:  MRI of the brain showing a left PCA large infarct and punctate right PCA infarct MRA remarkable for left PCA P3 branch occlusion, ICA siphon atherosclerosis with possible tandem significant stenosis of the right siphon  PRECAUTIONS: Fall, hx of CA  WEIGHT BEARING RESTRICTIONS: No  PAIN:  Are you having pain? No  FALLS: Has patient fallen in last 6 months? Yes. Number of falls 1  LIVING ENVIRONMENT: Lives with: lives alone, fiancee is there most of the time Lives in: House/apartment Stairs: 1 level stairs to enter Has following equipment at home: Single point cane  PLOF: Independent  PATIENT GOALS: to get right  OBJECTIVE:   HAND DOMINANCE: Right  ADLs: Overall ADLs: fall risk in standing Transfers/ambulation related to [place] Eating: mod I Grooming: mod I UB Dressing: mod I  LB Dressing: supervision, increased time required, therapist recommends pt sits for LB dressing due to fall risk Toileting: mod I using cane Bathing: supervision Tub Shower transfers: min A currently standing in shower, no grab bars, using tub shower does not have seat Equipment: none  IADLs: Shopping: has assistance Light housekeeping: fiancee handles Meal Prep: does not cook Community mobility: ambulates with cane, supervision-minguard, unsteady and walks close to items Medication management: fiancee is helping with meds Financial management: fiancee is assisting  Handwriting: 100% legible  MOBILITY STATUS:  ambulates with minguard with cane , some unsteadiness and walks close to walls and doorways   FUNCTIONAL OUTCOME MEASURES: FOTO: TBA  UPPER EXTREMITY ROM:  remaining AROM is WFLS   Active ROM Right eval Left eval  Shoulder flexion 105 105  Shoulder abduction     Shoulder adduction    Shoulder extension    Shoulder internal rotation    Shoulder external rotation    Elbow flexion    Elbow extension -15 -20  Wrist flexion    Wrist extension    Wrist ulnar deviation    Wrist radial deviation    Wrist pronation    Wrist supination    (Blank rows = not tested)  UPPER EXTREMITY MMT:     MMT Right eval Left eval  Shoulder flexion 4-/5 3+/5  Shoulder abduction    Shoulder adduction    Shoulder extension    Shoulder internal rotation  Shoulder external rotation    Middle trapezius    Lower trapezius    Elbow flexion 4+/5 4+/5  Elbow extension 4/5 4/5  Wrist flexion    Wrist extension    Wrist ulnar deviation    Wrist radial deviation    Wrist pronation    Wrist supination    (Blank rows = not tested)  HAND FUNCTION: Grip strength: Right: 46.9 lbs; Left: 40.3 lbs  COORDINATION: 9 Hole Peg test: Right: 44 sec; Left: 40 sec   SENSATION: Light touch: Impaired  for ring and small finger R hand ulnar distribution, reports this was since CA treatment  EDEMA: n/a   COGNITION: Overall cognitive status: Impaired Decreased short term memory, delayed processing and decreased awareness  VISION:  Baseline vision: Wears glasses all the time   VISION ASSESSMENT: Number cancellation task 1.5 M : 7/80 missed, 91% Visual Fields: per hospital records R homonymous hemianopsia  Vision to be further assessed in a functional context  Patient has difficulty with following activities due to following visual impairments: ambulates close to walls and doorways on R side   OBSERVATIONS: Pt with minimal verbalizations and poor awareness of deficits   TODAY'S TREATMENT:                                                                                                                               - Self-care/home management completed for duration as noted below including: Reviewed pt's HEP and goals as noted in pt instructions. Discussed  establishing a daily schedule to help with reported sleep difficulties and to return to old routines (I.e. reading the Bible in the morning). Used WARM strategy to help guide schedule suggestions.  - Therapeutic exercises completed for duration as noted below including:  OT initiated red Thera-Band HEP including shoulder flexion, horizontal abd/add, bicep curl, and tricep extension to promote strengthening of affected extremity and overall endurance as noted in pt instructions.    PATIENT EDUCATION: Education details: HEP review; Red theraband HEP Person educated: Patient and Spouse Education method: Explanation, Demonstration, and Handouts Education comprehension: verbalized understanding, returned demonstration, and needs further education   HOME EXERCISE PROGRAM: 05/30/22: Visual scanning strategies, coordination HEP 06/01/22: cane HEP   06/06/22: memory strategies 06/20/22: Red theraband HEP   GOALS: Goals reviewed with patient? Yes  SHORT TERM GOALS: Target date: 06/02/22  I with initial HEP  Goal status: MET w/ cues  2.  Pt will perform tub transfers safely mod I using tub bench prn. Baseline: doe not have tub bench yet, currently supervision, min A Goal status: MET  3.  Pt/caregiver will verbalize understanding of compensations for visual deficits  Goal status: MET  4.  Pt will demonstrate improved fine motor coordination  for ADLs with RUE as evidenced by decreasing 9 hole peg test score by 3 secs Baseline: 9 Hole Peg test: Right: 44 sec; Left: 40 sec Goal status: IN PROGRESS  5.   Pt will  navigate a busy environment and locate items with 75% accuracy without bumping into items. Baseline: nearly bumps into wall and doorway on right side Goal status: INITIAL  6.  Pt will verbalize understanding of compensations for short term memory deficits. Baseline:  Goal status: IN PROGRESS   LONG TERM GOALS: Target date: 07/25/21  I with updated HEP Baseline:  Goal status:  INITIAL  2.  Pt will navigate a busy environment and locate items with 90% accuracy. Baseline: nearly bumps into wall and doorway on right side Goal status: INITIAL  3.  Pt will perform all basic ADLs mod I with good safety awareness. Baseline: supervision, min A Goal status: INITIAL  4.  Pt will demonstrate improved fine motor coordination with RUE  for ADLs as evidenced by decreasing 9 hole peg test score by 6 secs form initial eval. Baseline: 9 Hole Peg test: Right: 44 sec; Left: 40 sec Goal status: INITIAL  5.  Pt will demonstrate ability to retrieve lightweight object at 110* shoulder flexion and -10 elbow extension with RUE Baseline: shoulder flexion 105, elbow extension -20 Goal status: INITIAL  6.  Pt will demonstrate ability to retrieve lightweight object at 110* shoulder flexion and -15 elbow extension with LUE Baseline: shoulder flexion 105, elbow extension -20 Goal status: inital  ASSESSMENT:  CLINICAL IMPRESSION: Pt progressing towards goals. Pt limited by cognitive and visual/perceptual deficits.  Pt benefits from questioning cueing.    PERFORMANCE DEFICITS: in functional skills including ADLs, IADLs, coordination, dexterity, proprioception, sensation, tone, ROM, strength, flexibility, Fine motor control, Gross motor control, mobility, balance, endurance, decreased knowledge of precautions, decreased knowledge of use of DME, vision, and UE functional use, cognitive skills including energy/drive, learn, memory, problem solving, safety awareness, thought, and understand, and psychosocial skills including coping strategies, environmental adaptation, habits, interpersonal interactions, and routines and behaviors.   IMPAIRMENTS: are limiting patient from ADLs, IADLs, rest and sleep, play, leisure, and social participation.   CO-MORBIDITIES: may have co-morbidities  that affects occupational performance. Patient will benefit from skilled OT to address above impairments and  improve overall function.  MODIFICATION OR ASSISTANCE TO COMPLETE EVALUATION: No modification of tasks or assist necessary to complete an evaluation.  OT OCCUPATIONAL PROFILE AND HISTORY: Detailed assessment: Review of records and additional review of physical, cognitive, psychosocial history related to current functional performance.  CLINICAL DECISION MAKING: LOW - limited treatment options, no task modification necessary  REHAB POTENTIAL: Good  EVALUATION COMPLEXITY: Low   PLAN:  OT FREQUENCY: 2x/week  OT DURATION: 12 weeks  PLANNED INTERVENTIONS: self care/ADL training, therapeutic exercise, therapeutic activity, neuromuscular re-education, manual therapy, passive range of motion, gait training, balance training, stair training, functional mobility training, aquatic therapy, splinting, paraffin, fluidotherapy, moist heat, cryotherapy, contrast bath, patient/family education, cognitive remediation/compensation, visual/perceptual remediation/compensation, energy conservation, coping strategies training, DME and/or AE instructions, and Re-evaluation  RECOMMENDED OTHER SERVICES: PT, ST  CONSULTED AND AGREED WITH PLAN OF CARE: Patient and family member/caregiver  PLAN FOR NEXT SESSION:  check STGs 4-6, review red theraband HEP, simple environmental scanning    Dennis Bast, OTR/L 06/20/2022, 12:23 PM

## 2022-06-20 NOTE — Patient Instructions (Addendum)
Purchase an outlet timer for your lamp. You can set a timer to turn the light off at a certain time.   Outlet timer at [employer]: [address]  Practice the steps of using an ATM

## 2022-06-20 NOTE — Therapy (Signed)
OUTPATIENT SPEECH LANGUAGE PATHOLOGY TREATMENT   Patient Name: Antonio Adams MRN: [id number] DOB:03/27/1946, 77 y.o., male Today's Date: 06/20/2022  PCP: [name]., MD REFERRING PROVIDER: Hoyt Koch., MD  END OF SESSION:  End of Session - 06/20/22 1055     Visit Number 7    Number of Visits 25    Date for SLP Re-Evaluation 07/26/22    Authorization Type [employer] Medicare    SLP Start Time 1100    SLP Stop Time  1145    SLP Time Calculation (min) 45 min    Activity Tolerance Patient tolerated treatment well              Past Medical History:  Diagnosis Date   ADENOCARCINOMA, PROSTATE, GLEASON GRADE 6 12/21/2009   prostate cancer   ALLERGIC RHINITIS 03/16/2007   Allergy    Anxiety    Arthritis    back    BACK PAIN WITH RADICULOPATHY 11/23/2008   Cancer of kidney ([place])    partial right kidney removed   CARPAL TUNNEL SYNDROME, BILATERAL 03/16/2007   Chronic back pain    Constipation    takes Senokot daily   Depression    occasionally   DIABETES MELLITUS, TYPE II 03/16/2007   takes Januvia and MEtformin daily   GASTROINTESTINAL HEMORRHAGE, HX OF 03/16/2007   GERD 03/16/2007   takes Omeprazole daily   Glaucoma    mild - no eye drops   Hemorrhoids    History of colon polyps    HYPERLIPIDEMIA 03/16/2007   takes Crestor daily   HYPERTENSION 03/16/2007   takes Diltiazem and Lisinopril daily    Kidney cancer, primary, with metastasis from kidney to other site [facility])    LEG CRAMPS 05/22/2007   Nocturia    Overactive bladder    PARESTHESIA 05/21/2007   Proctitis    PTSD (post-traumatic stress disorder)    wakes up may be dreaming of fighting   Rectal bleeding    Dr.Norins has explained its from the Radiation that he has received   Rectal bleeding    Renal cell carcinoma ([place])    Sleep apnea    uses CPAP nightly   Stomach cancer ([place])    Urinary frequency    takes Toviaz daily   Past Surgical History:  Procedure Laterality Date   ADRENALECTOMY  Right 02/26/2020   Procedure: OPEN RIGHT RENAL CELL METASTATECTOMY AND  ADRENALECTOMY;  Surgeon: [name], MD;  Location: [facility];  Service: Urology;  Laterality: Right;   [place], 2008   repeat surgery; ESI '08   [place]     bilateral   CERVICAL FUSION     COLONOSCOPY     colonosocpy     ESOPHAGOGASTRODUODENOSCOPY     ED   FLEXIBLE SIGMOIDOSCOPY N/A 01/08/2017   Procedure: FLEXIBLE SIGMOIDOSCOPY;  Surgeon: Irene Shipper, MD;  Location: [facility];  Service: Endoscopy;  Laterality: N/A;   HOT HEMOSTASIS N/A 01/08/2017   Procedure: HOT HEMOSTASIS (ARGON PLASMA COAGULATION/BICAP);  Surgeon: Irene Shipper, MD;  Location: [facility] ENDOSCOPY;  Service: Endoscopy;  Laterality: N/A;   IR RADIOLOGIST EVAL & MGMT  12/17/2019   IR RADIOLOGIST EVAL & MGMT  07/01/2020   IR RADIOLOGIST EVAL & MGMT  08/25/2020   LUMBAR LAMINECTOMY  10/30/2011   Procedure: MICRODISCECTOMY LUMBAR LAMINECTOMY;  Surgeon: Jessy Oto, MD;  Location: [facility];  Service: Orthopedics;  Laterality: Right;  Right L4-5 and L5-S1 Microdiscectomy   LUMBAR LAMINECTOMY  06/17/2012   Procedure: MICRODISCECTOMY LUMBAR LAMINECTOMY;  Surgeon: Jessy Oto, MD;  Location: [facility];  Service: Orthopedics;  Laterality: N/A;  Right L5-S1 microdiscectomy   OPEN PARTIAL HEPATECTOMY  N/A 02/26/2020   Procedure: OPEN PARTIAL HEPATECTOMY, CHOLECYSTECTOMY;  Surgeon: Stark Klein, MD;  Location: [facility];  Service: General;  Laterality: N/A;   POLYPECTOMY     PROSTATE SURGERY  march 2012   seed implant   RADIOFREQUENCY ABLATION N/A 07/21/2020   Procedure: CT MICROWAVE ABLATION;  Surgeon: Criselda Peaches, MD;  Location: [facility];  Service: Anesthesiology;  Laterality: N/A;   ROBOTIC ASSITED PARTIAL NEPHRECTOMY Right 09/08/2015   Procedure: XI ROBOTIC ASSITED RIGHT PARTIAL NEPHRECTOMY;  Surgeon: [name], MD;  Location: [facility];  Service: Urology;  Laterality: Right;  Clamp on: 1033 Clamp off: 1103 Total Clamp Time: 30  minutes   SHOULDER ARTHROSCOPY WITH SUBACROMIAL DECOMPRESSION Left 01/20/2016   Procedure: LEFT SHOULDER ARTHROSCOPY WITH EXTENSIVE  DEBRIDEMENT, ACROMIOPLASTY;  Surgeon: Ninetta Lights, MD;  Location: [facility];  Service: Orthopedics;  Laterality: Left;   SHOULDER SURGERY  LFT   Stress Cardiolite  12/24/2003   negative for ischemia   UPPER ESOPHAGEAL ENDOSCOPIC ULTRASOUND (EUS)  04/18/2016   [facility]   UPPER GASTROINTESTINAL ENDOSCOPY     WOUND EXPLORATION     management of a wound that he sustained in the [place]   Patient Active Problem List   Diagnosis Date Noted   Vaccine for VZV (varicella-zoster virus) 06/12/2022   Hx of completed stroke 04/28/2022   Abnormal brain CT 04/07/2022   Encounter for general adult medical examination with abnormal findings 01/23/2022   Anemia due to zinc deficiency 12/20/2021   Low back pain 12/15/2021   Symptomatic anemia 12/15/2021   Cancer related pain 12/15/2021   Chronic heart failure with preserved ejection fraction (HFpEF) ([place]) 08/12/2021   Tinea cruris 08/12/2021   Anemia due to chronic kidney disease 07/20/2021   CKD (chronic kidney disease) stage 5, GFR less than 15 ml/min (HCC) 05/24/2021   BPH (benign prostatic hyperplasia) 05/24/2021   Diabetic renal disease ([place]) 05/20/2021   Dry eye syndrome of bilateral lacrimal glands 05/20/2021   Senile nuclear sclerosis 05/20/2021   Vitamin D deficiency 05/20/2021   Vitreous degeneration, unspecified eye 05/20/2021   Intrinsic eczema 04/18/2021   Atherosclerosis of aorta ([place]) 12/30/2020   Mixed conductive and sensorineural hearing loss of left ear with restricted hearing of right ear 09/01/2020   Tinnitus of both ears 05/20/2020   Metastatic renal cell carcinoma ([place]) 02/26/2020   Diabetic polyneuropathy associated with type 2 diabetes mellitus ([place]) 12/18/2019   Murmur, cardiac 10/08/2018   DDD (degenerative disc disease), cervical 10/08/2018   OAB (overactive  bladder) 04/04/2018   Seasonal allergic rhinitis due to pollen 11/01/2016   Microalbuminuria due to type 2 diabetes mellitus ([place]) 07/27/2016   Varicose veins of both lower extremities 08/06/2015   Varicose veins of lower extremities with ulcer ([place]) 06/16/2015   Internal hemorrhoid, bleeding 08/03/2014   Post traumatic stress disorder (PTSD) 01/29/2014   Spinal stenosis of lumbar region with radiculopathy 10/30/2011    Class: Chronic   Obstructive sleep apnea 11/24/2010   Obesity 11/24/2010   Routine general medical examination at a health care facility 11/24/2010   ADENOCARCINOMA, PROSTATE, GLEASON GRADE 6 12/21/2009   Essential hypertension 03/16/2007   GERD 03/16/2007    ONSET DATE: 04-07-22   REFERRING DIAG: [id number] (ICD-10-CM) - Personal history of transient ischemic attack (TIA), and cerebral  infarction without residual deficits   THERAPY DIAG:  Cognitive communication deficit  Rationale for Evaluation and Treatment: Rehabilitation  SUBJECTIVE:   SUBJECTIVE STATEMENT: "Haven't been sleeping well" Pt reported that he has fallen asleep with the light on recently.   PAIN: Are you having pain? No  OBJECTIVE:    TODAY'S TREATMENT:   06-20-22: Pt accompanied by wife. Pt reported difficulty sleeping over the past few nights d/t forgetting to turn light off. SLP suggested purchasing a timer to turn light off at a certain time, to which pt was receptive. Gathered resources on where to find item and also recommended schedule management strategies, such as purchasing a digital clock that shows the date. SLP provided re-education concerning importance of maintaining a consistent sleep schedule for overall health and cognition.  Pt stated "I think everything is going pretty well." Pt reported bill paying is going well. Pt continues to demonstrate decreased motivation and awareness of deficits. Requires encouragement (frequent verbal cues) to participate in therapeutic tasks and provide  sufficient detail in responses.Pt reported that grocery shopping is going well; he has been remembering what he needs to buy.  Wife endorsed that she has "seen improvement". Clinician facilitated checkbook balancing activity, providing consistent verbal and visual cues to guide pt through process. Pt with breakdowns in knowing where to place values and complete computations to balance. Verbal and tactile cues required for using calculator to aid in calculations.  HEP: Use the ATM and go through the process of taking out money to practice the steps/sequence.   06-15-22: Reports improved sleeping habits secondary to recommendations from primary SLP, which has improved cognitive functioning during the day. Good carryover of medication recommendations reported. Pt provided pill box as example with AM medication taken. Discussed cognitive strategies for bill management, including writing date of calendar when bill arrives. Pt and fiance verbalized agreement. Suspects reduced intellectual and emergent awareness, as pt reported some simple mistakes during usual routine. Pt often attempted to explain away deficits when possible deficit identified. Recommended extra processing time prior to initiating task ("stop and think") and double checking during/after task to ensure accurate completion. See patient instructions with recs.   06-08-22: Pt has implemented some sleep hygiene strategies discussed prior session and reports to having 2 night of improved rest. Reports to feeling "much better. It makes a big difference." SLP brings awareness to improved sleep leading to pt being able to prepare self for the day and take medication yesterday and this morning. SLP provided direct instruction on benefit of ID challenges and then determining the HOW for improved completion/participation (meta-cognitive strategy instruction). Example given of how pt was able to have light breakfast this AM since he had previously made a plan for  easy meals on therapy mornings. SLP probes how else meta-cognitive strategy can be employed with pt ID orientation to time of day as current challenge (e.g. when waking from nap will think it's next day). SLP provided suggestion for large print digital clock with time, day, time of day and showed were can be purchased. Pt ID living room and bedroom would be good places to keep clocks to A in orientation.   06-06-22: Pt completed medication chart, with verification of accuracy by s/o, [name]. ID challenge of taking morning medication per advised time d/t difficulty getting sleep. Pt reports is waking around 3 AM and is unable to fall asleep until early morning which then results in sleeping until noon. Despite being awake early this morning for therapy, did not  take meds d/t decreased appetite and time for breakfast. SLP A pt in generating list of quick and simple morning meals pt could try to facilitate medication administration on days that he must be up and out of door early. Pt generates x3 items with mod-I. SLP advises on sleep hygiene habits which may aid in improved sleep duration and quality-- create sleep pressure by sticking to routine, turn off tv in bedroom, creating nighttime routine, viewing morning sunlight for 5-10 minutes on sunny days, longer on cloudy days. Education on role of sleep in clarity of thought, encoding memories, and decision making.   PATIENT EDUCATION: Education details: see above Person educated: Patient and Spouse Education method: Explanation, Demonstration, and Verbal cues Education comprehension: verbalized understanding, returned demonstration, and needs further education   GOALS: Goals reviewed with patient? Yes  SHORT TERM GOALS: Target date: 05-31-22 (addressed today)  Pt will complete CLQT and PROM initial therapy session Baseline: Goal status: MET  2.  Pt will accurately sort medications with use of compensations PRN into pill box with occasional  mod-A Baseline:  Goal status: MET  3.  Pt will generate visual aid to assist with increased independence in paying bills with A from SLP and s/o Baseline:  Goal status: PARTIALLY MET  4.  Pt will verbalize appropriate memory strategy given functional scenario with 80% accuracy   Baseline:  Goal status: NOT MET  5.  Pt and s/o will implement external A for orientation (e.g. calendar) with usual cues from care partner to initiate use  Baseline:  Goal status: PARTIALLY MET   LONG TERM GOALS: Target date: 07-26-22  With use of external aid, pt will answer orientation questions with 100% accuracy over 2 sessions Baseline:  Goal status: IN PROGRESS  2.  Pt will verbalize x3 situations in which he successfully employed a memory strategy resulting in subjective satisfaction with ability to recall desired information Baseline:  Goal status: IN PROGRESS  3.  Pt will pay x3 of own bills with supervision from s/o, with use of compensations PRN Baseline:  Goal status: IN PROGRESS  4.  Pt will report improvement via PROM by dc Baseline:  Goal status: IN PROGRESS  ASSESSMENT:  CLINICAL IMPRESSION: Patient is a 77 y.o. M who was seen today for cognitive linguistic evaluation s/p stroke. Mr. Beazer presents with moderate cognitive impairment in areas of orientation, awareness, memory, executive functioning. Language marked as severe per CLQT, however pt and caregiver insistent that communication not impacted by stroke. Overall reduced verbal output and resistant to speak during tx in response to SLP questions/comments. Pt now requiring less assistance from significant other to manage medication, schedule, and finances. Prior to stroke pt was fully independent. Pt would benefit from skilled ST to facilitate increased independence, enhance QoL, and reduced caregiver burden.    OBJECTIVE IMPAIRMENTS: include attention, memory, awareness, executive functioning, and expressive language. These  impairments are limiting patient from managing medications, managing appointments, managing finances, household responsibilities, and ADLs/IADLs. Factors affecting potential to achieve goals and functional outcome are ability to learn/carryover information and cooperation/participation level.. Patient will benefit from skilled SLP services to address above impairments and improve overall function.  REHAB POTENTIAL: Fair (carryover and participation)  PLAN:  SLP FREQUENCY: 2x/week  SLP DURATION: 12 weeks  PLANNED INTERVENTIONS: Cueing hierachy, Cognitive reorganization, Internal/external aids, Functional tasks, SLP instruction and feedback, Compensatory strategies, and Patient/family education    Su Monks, CCC-SLP 06/20/2022, 10:55 AM

## 2022-06-20 NOTE — Patient Instructions (Signed)
Upper Body Strengthening Exercises  Comments  Sit upright, away from the back of your chair. Keep abdominal muscles engaged i.e. pull belly button to spine.  Repeat 10 Times  Hold 3 Seconds  Complete 1 Set  Perform 2 Times a Day   FOR ALL EXERCISES:   ELASTIC BAND FLEXION   Place unaffected arm on your leg or hip. With your affected arm, hold elastic band in front of you and pull the band upward towards the ceiling as shown.       ELASTIC BAND HORIZONTAL ABDUCTION - SCAPULAR RETRACTION  Start by holding elastic band in front of your chest with your elbows straight. Then, pull your arms apart and towards the side while squeezing your shoulder blades together. Return to starting position and repeat.            ELASTIC BAND TRICEPS EXTENSION  While seated, hold and fixate one end of an elastic band against your chest. Hold the other end with your opposite hand with your elbow bent and arm by your side.   Start by pulling the band downward so that the elbow goes from a bent position to a straightened position as shown. Return to starting position and repeat.                    BICEPS CURL WITH BAND  While sitting in an upright position, put an elastic band underneath your feet (as pictured)  OR underneath your thighs. Grip each end of the band, keep the elbows tucked to the body, and bring hands to shoulders by bending at the elbows.

## 2022-06-21 ENCOUNTER — Other Ambulatory Visit: Payer: Self-pay | Admitting: Family

## 2022-06-21 ENCOUNTER — Other Ambulatory Visit: Payer: Self-pay | Admitting: Internal Medicine

## 2022-06-21 ENCOUNTER — Encounter (HOSPITAL_COMMUNITY)
Admission: RE | Admit: 2022-06-21 | Discharge: 2022-06-21 | Disposition: A | Discharge status: Home / Self Care | Payer: Medicare Other | Source: Ambulatory Visit | Attending: Nephrology | Admitting: Nephrology

## 2022-06-21 VITALS — BP 143/97 | HR 74 | Temp 97.8°F | Resp 18

## 2022-06-21 DIAGNOSIS — N185 Chronic kidney disease, stage 5: Secondary | ICD-10-CM | POA: Insufficient documentation

## 2022-06-21 DIAGNOSIS — I5032 Chronic diastolic (congestive) heart failure: Secondary | ICD-10-CM

## 2022-06-21 LAB — POCT HEMOGLOBIN-HEMACUE: Hemoglobin: 10.6 g/dL — ABNORMAL LOW (ref 13.0–17.0)

## 2022-06-21 MED ORDER — EPOETIN ALFA-EPBX 40000 UNIT/ML IJ SOLN
30000.0000 [IU] | INTRAMUSCULAR | Status: DC
  Administered 2022-06-21: 30000 [IU] via SUBCUTANEOUS

## 2022-06-21 MED ORDER — EPOETIN ALFA-EPBX 40000 UNIT/ML IJ SOLN
INTRAMUSCULAR | Status: AC
  Filled 2022-06-21: qty 1

## 2022-06-22 ENCOUNTER — Encounter: Payer: Self-pay | Admitting: Rehabilitation

## 2022-06-22 ENCOUNTER — Ambulatory Visit: Payer: Medicare Other | Admitting: Occupational Therapy

## 2022-06-22 ENCOUNTER — Ambulatory Visit: Payer: Medicare Other | Admitting: Speech Pathology

## 2022-06-22 ENCOUNTER — Encounter: Payer: Self-pay | Admitting: Occupational Therapy

## 2022-06-22 ENCOUNTER — Ambulatory Visit: Payer: Medicare Other | Admitting: Rehabilitation

## 2022-06-22 DIAGNOSIS — M6281 Muscle weakness (generalized): Secondary | ICD-10-CM

## 2022-06-22 DIAGNOSIS — R278 Other lack of coordination: Secondary | ICD-10-CM

## 2022-06-22 DIAGNOSIS — R41842 Visuospatial deficit: Secondary | ICD-10-CM

## 2022-06-22 DIAGNOSIS — R41841 Cognitive communication deficit: Secondary | ICD-10-CM

## 2022-06-22 DIAGNOSIS — R2689 Other abnormalities of gait and mobility: Secondary | ICD-10-CM

## 2022-06-22 DIAGNOSIS — R41840 Attention and concentration deficit: Secondary | ICD-10-CM

## 2022-06-22 DIAGNOSIS — R2681 Unsteadiness on feet: Secondary | ICD-10-CM

## 2022-06-22 NOTE — Therapy (Signed)
OUTPATIENT OCCUPATIONAL THERAPY NEURO TREATMENT  Patient Name: KASTON FAUGHN MRN: [id number] DOB:02/26/1946, 77 y.o., male Today's Date: 06/22/2022  PCP: Dr. Sharlet Salina REFERRING PROVIDER: Dr. Sharlet Salina  END OF SESSION:  OT End of Session - 06/22/22 1058     Visit Number 7    Number of Visits 25    Date for OT Re-Evaluation 07/26/22    Authorization Type [employer]    Authorization - Visit Number 7    Authorization - Number of Visits 10    Progress Note Due on Visit 10    OT Start Time 1103    OT Stop Time 1145    OT Time Calculation (min) 42 min    Activity Tolerance Patient tolerated treatment well    Behavior During Therapy WFL for tasks assessed/performed             Past Medical History:  Diagnosis Date   ADENOCARCINOMA, PROSTATE, GLEASON GRADE 6 12/21/2009   prostate cancer   ALLERGIC RHINITIS 03/16/2007   Allergy    Anxiety    Arthritis    back    BACK PAIN WITH RADICULOPATHY 11/23/2008   Cancer of kidney ([place])    partial right kidney removed   CARPAL TUNNEL SYNDROME, BILATERAL 03/16/2007   Chronic back pain    Constipation    takes Senokot daily   Depression    occasionally   DIABETES MELLITUS, TYPE II 03/16/2007   takes Januvia and MEtformin daily   GASTROINTESTINAL HEMORRHAGE, HX OF 03/16/2007   GERD 03/16/2007   takes Omeprazole daily   Glaucoma    mild - no eye drops   Hemorrhoids    History of colon polyps    HYPERLIPIDEMIA 03/16/2007   takes Crestor daily   HYPERTENSION 03/16/2007   takes Diltiazem and Lisinopril daily    Kidney cancer, primary, with metastasis from kidney to other site [facility])    LEG CRAMPS 05/22/2007   Nocturia    Overactive bladder    PARESTHESIA 05/21/2007   Proctitis    PTSD (post-traumatic stress disorder)    wakes up may be dreaming of fighting   Rectal bleeding    Dr.Norins has explained its from the Radiation that he has received   Rectal bleeding    Renal cell carcinoma ([place])    Sleep apnea    uses CPAP nightly    Stomach cancer ([place])    Urinary frequency    takes Toviaz daily   Past Surgical History:  Procedure Laterality Date   ADRENALECTOMY Right 02/26/2020   Procedure: OPEN RIGHT RENAL CELL METASTATECTOMY AND  ADRENALECTOMY;  Surgeon: [name], MD;  Location: [facility];  Service: Urology;  Laterality: Right;   [place], 2008   repeat surgery; ESI '08   [place]     bilateral   CERVICAL FUSION     COLONOSCOPY     colonosocpy     ESOPHAGOGASTRODUODENOSCOPY     ED   FLEXIBLE SIGMOIDOSCOPY N/A 01/08/2017   Procedure: FLEXIBLE SIGMOIDOSCOPY;  Surgeon: Irene Shipper, MD;  Location: [facility];  Service: Endoscopy;  Laterality: N/A;   HOT HEMOSTASIS N/A 01/08/2017   Procedure: HOT HEMOSTASIS (ARGON PLASMA COAGULATION/BICAP);  Surgeon: Irene Shipper, MD;  Location: [facility];  Service: Endoscopy;  Laterality: N/A;   IR RADIOLOGIST EVAL & MGMT  12/17/2019   IR RADIOLOGIST EVAL & MGMT  07/01/2020   IR RADIOLOGIST EVAL & MGMT  08/25/2020   LUMBAR LAMINECTOMY  10/30/2011   Procedure:  MICRODISCECTOMY LUMBAR LAMINECTOMY;  Surgeon: Jessy Oto, MD;  Location: [place];  Service: Orthopedics;  Laterality: Right;  Right L4-5 and L5-S1 Microdiscectomy   LUMBAR LAMINECTOMY  06/17/2012   Procedure: MICRODISCECTOMY LUMBAR LAMINECTOMY;  Surgeon: Jessy Oto, MD;  Location: [place];  Service: Orthopedics;  Laterality: N/A;  Right L5-S1 microdiscectomy   OPEN PARTIAL HEPATECTOMY  N/A 02/26/2020   Procedure: OPEN PARTIAL HEPATECTOMY, CHOLECYSTECTOMY;  Surgeon: Stark Klein, MD;  Location: [facility];  Service: General;  Laterality: N/A;   POLYPECTOMY     PROSTATE SURGERY  march 2012   seed implant   RADIOFREQUENCY ABLATION N/A 07/21/2020   Procedure: CT MICROWAVE ABLATION;  Surgeon: Criselda Peaches, MD;  Location: [facility];  Service: Anesthesiology;  Laterality: N/A;   ROBOTIC ASSITED PARTIAL NEPHRECTOMY Right 09/08/2015   Procedure: XI ROBOTIC ASSITED RIGHT PARTIAL NEPHRECTOMY;   Surgeon: [name], MD;  Location: [facility];  Service: Urology;  Laterality: Right;  Clamp on: 1033 Clamp off: 1103 Total Clamp Time: 30 minutes   SHOULDER ARTHROSCOPY WITH SUBACROMIAL DECOMPRESSION Left 01/20/2016   Procedure: LEFT SHOULDER ARTHROSCOPY WITH EXTENSIVE  DEBRIDEMENT, ACROMIOPLASTY;  Surgeon: Ninetta Lights, MD;  Location: [place];  Service: Orthopedics;  Laterality: Left;   SHOULDER SURGERY  LFT   Stress Cardiolite  12/24/2003   negative for ischemia   UPPER ESOPHAGEAL ENDOSCOPIC ULTRASOUND (EUS)  04/18/2016   [facility]   UPPER GASTROINTESTINAL ENDOSCOPY     WOUND EXPLORATION     management of a wound that he sustained in the [place]   Patient Active Problem List   Diagnosis Date Noted   Vaccine for VZV (varicella-zoster virus) 06/12/2022   Hx of completed stroke 04/28/2022   Abnormal brain CT 04/07/2022   Encounter for general adult medical examination with abnormal findings 01/23/2022   Anemia due to zinc deficiency 12/20/2021   Low back pain 12/15/2021   Symptomatic anemia 12/15/2021   Cancer related pain 12/15/2021   Chronic heart failure with preserved ejection fraction (HFpEF) ([place]) 08/12/2021   Tinea cruris 08/12/2021   Anemia due to chronic kidney disease 07/20/2021   CKD (chronic kidney disease) stage 5, GFR less than 15 ml/min (HCC) 05/24/2021   BPH (benign prostatic hyperplasia) 05/24/2021   Diabetic renal disease ([place]) 05/20/2021   Dry eye syndrome of bilateral lacrimal glands 05/20/2021   Senile nuclear sclerosis 05/20/2021   Vitamin D deficiency 05/20/2021   Vitreous degeneration, unspecified eye 05/20/2021   Intrinsic eczema 04/18/2021   Atherosclerosis of aorta ([place]) 12/30/2020   Mixed conductive and sensorineural hearing loss of left ear with restricted hearing of right ear 09/01/2020   Tinnitus of both ears 05/20/2020   Metastatic renal cell carcinoma ([place]) 02/26/2020   Diabetic polyneuropathy associated with type 2  diabetes mellitus ([place]) 12/18/2019   Murmur, cardiac 10/08/2018   DDD (degenerative disc disease), cervical 10/08/2018   OAB (overactive bladder) 04/04/2018   Seasonal allergic rhinitis due to pollen 11/01/2016   Microalbuminuria due to type 2 diabetes mellitus ([place]) 07/27/2016   Varicose veins of both lower extremities 08/06/2015   Varicose veins of lower extremities with ulcer ([place]) 06/16/2015   Internal hemorrhoid, bleeding 08/03/2014   Post traumatic stress disorder (PTSD) 01/29/2014   Spinal stenosis of lumbar region with radiculopathy 10/30/2011    Class: Chronic   Obstructive sleep apnea 11/24/2010   Obesity 11/24/2010   Routine general medical examination at a health care facility 11/24/2010   ADENOCARCINOMA, PROSTATE, GLEASON GRADE 6 12/21/2009  Essential hypertension 03/16/2007   GERD 03/16/2007    ONSET DATE: 04/07/22  REFERRING DIAG: Diagnosis Z86.73 (ICD-10-CM) - Personal history of transient ischemic attack (TIA), and cerebral infarction without residual deficits  THERAPY DIAG:  Muscle weakness (generalized)  Other lack of coordination  Visuospatial deficit  Attention and concentration deficit  Rationale for Evaluation and Treatment: Rehabilitation  SUBJECTIVE:   SUBJECTIVE STATEMENT: Pt reports he's tired today (after PT)   Pt accompanied by: significant other fiancee [name]  PERTINENT HISTORY: 77 y.o. R-handed male with history of DM2 with diabetic polyneuropathy, OSA on nightly CPAP, PTSD, history of lumbar stenosis with chronic back pain, metastatic renal carcinoma with lymphadenopathy and questionable hepatic involvement noted in 2022, not on chemo due to poor tolerance to the medications, and without significant progression since May 2023, only on surveillance imaging, stage IV, eczema, vitamin D deficiency, CKD stage V refusing HD, BPH, anemia of CKD  Zinc deficiency, chronic heart failure, mild cognitive impairment, s/p left PCA territory acute CVA on  04/07/22   DIAGNOSTIC FINDINGS:  MRI of the brain showing a left PCA large infarct and punctate right PCA infarct MRA remarkable for left PCA P3 branch occlusion, ICA siphon atherosclerosis with possible tandem significant stenosis of the right siphon  PRECAUTIONS: Fall, hx of CA  WEIGHT BEARING RESTRICTIONS: No  PAIN:  Are you having pain? No  FALLS: Has patient fallen in last 6 months? Yes. Number of falls 1  LIVING ENVIRONMENT: Lives with: lives alone, fiancee is there most of the time Lives in: House/apartment Stairs: 1 level stairs to enter Has following equipment at home: Single point cane  PLOF: Independent  PATIENT GOALS: to get right  OBJECTIVE:   HAND DOMINANCE: Right  ADLs: Overall ADLs: fall risk in standing Transfers/ambulation related to [place] Eating: mod I Grooming: mod I UB Dressing: mod I  LB Dressing: supervision, increased time required, therapist recommends pt sits for LB dressing due to fall risk Toileting: mod I using cane Bathing: supervision Tub Shower transfers: min A currently standing in shower, no grab bars, using tub shower does not have seat Equipment: none  IADLs: Shopping: has assistance Light housekeeping: fiancee handles Meal Prep: does not cook Community mobility: ambulates with cane, supervision-minguard, unsteady and walks close to items Medication management: fiancee is helping with meds Financial management: fiancee is assisting  Handwriting: 100% legible  MOBILITY STATUS:  ambulates with minguard with cane , some unsteadiness and walks close to walls and doorways   FUNCTIONAL OUTCOME MEASURES: FOTO: TBA  UPPER EXTREMITY ROM:  remaining AROM is WFLS   Active ROM Right eval Left eval  Shoulder flexion 105 105  Shoulder abduction    Shoulder adduction    Shoulder extension    Shoulder internal rotation    Shoulder external rotation    Elbow flexion    Elbow extension -15 -20  Wrist flexion    Wrist  extension    Wrist ulnar deviation    Wrist radial deviation    Wrist pronation    Wrist supination    (Blank rows = not tested)  UPPER EXTREMITY MMT:     MMT Right eval Left eval  Shoulder flexion 4-/5 3+/5  Shoulder abduction    Shoulder adduction    Shoulder extension    Shoulder internal rotation    Shoulder external rotation    Middle trapezius    Lower trapezius    Elbow flexion 4+/5 4+/5  Elbow extension 4/5 4/5  Wrist flexion  Wrist extension    Wrist ulnar deviation    Wrist radial deviation    Wrist pronation    Wrist supination    (Blank rows = not tested)  HAND FUNCTION: Grip strength: Right: 46.9 lbs; Left: 40.3 lbs  COORDINATION: 9 Hole Peg test: Right: 44 sec; Left: 40 sec   SENSATION: Light touch: Impaired  for ring and small finger R hand ulnar distribution, reports this was since CA treatment  EDEMA: n/a   COGNITION: Overall cognitive status: Impaired Decreased short term memory, delayed processing and decreased awareness  VISION:  Baseline vision: Wears glasses all the time   VISION ASSESSMENT: Number cancellation task 1.5 M : 7/80 missed, 91% Visual Fields: per hospital records R homonymous hemianopsia  Vision to be further assessed in a functional context  Patient has difficulty with following activities due to following visual impairments: ambulates close to walls and doorways on R side   OBSERVATIONS: Pt with minimal verbalizations and poor awareness of deficits   TODAY'S TREATMENT:                                                                                                                               - Self-care/home management completed for duration as noted below including: BP at beginning of O.T. session (immediately following P.T. session) = 179/85, HR = 59.  Reviewed strategies to help pt with better sleep pattern. Assessed remaining STG 's - see below - Therapeutic exercises completed for duration as noted below  including:  OT reviewed red Thera-Band HEP including shoulder flexion, horizontal abd/add, bicep curl, and tricep extension to promote strengthening of affected extremity and overall endurance. Pt still required demo and verbal cueing to perform correctly and recommended fiancee assisting patient to ensure correct positioning 2nd BP = 146/78, HR = 64 - Therapeutic activity: simple word search to work on prior to environmental scanning act.  Environmental scanning: pt found 9/15 on first pass (missing 6) = 56% accuracy, found 4/6 remaining items on 2nd pass. Pt did demo decreased overall attention and cued to slow down  PATIENT EDUCATION: Education details: review of theraband HEP Person educated: Patient and Spouse Education method: Explanation, Demonstration, and Handouts Education comprehension: verbalized understanding, returned demonstration, and needs further education   HOME EXERCISE PROGRAM: 05/30/22: Visual scanning strategies, coordination HEP 06/01/22: cane HEP   06/06/22: memory strategies 06/20/22: Red theraband HEP   GOALS: Goals reviewed with patient? Yes  SHORT TERM GOALS: Target date: 06/02/22  I with initial HEP  Goal status: MET w/ cues  2.  Pt will perform tub transfers safely mod I using tub bench prn. Baseline: doe not have tub bench yet, currently supervision, min A Goal status: MET  3.  Pt/caregiver will verbalize understanding of compensations for visual deficits  Goal status: MET  4.  Pt will demonstrate improved fine motor coordination  for ADLs with RUE as evidenced by decreasing 9 hole peg test score by 3  secs Baseline: 9 Hole Peg test: Right: 44 sec; Left: 40 sec Goal status: IN PROGRESS (06/22/22 = 42 sec)   5.   Pt will navigate a busy environment and locate items with 75% accuracy without bumping into items. Baseline: nearly bumps into wall and doorway on right side Goal status: IN PROGRESS (56% accuracy)   6.  Pt will verbalize understanding of  compensations for short term memory deficits. Baseline:  Goal status: PARTIALLY MET - issued and reviewed x2, however pt still has a hard time recalling strategies   LONG TERM GOALS: Target date: 07/25/21  I with updated HEP Baseline:  Goal status: INITIAL  2.  Pt will navigate a busy environment and locate items with 90% accuracy. Baseline: nearly bumps into wall and doorway on right side Goal status: INITIAL  3.  Pt will perform all basic ADLs mod I with good safety awareness. Baseline: supervision, min A Goal status: INITIAL  4.  Pt will demonstrate improved fine motor coordination with RUE  for ADLs as evidenced by decreasing 9 hole peg test score by 6 secs form initial eval. Baseline: 9 Hole Peg test: Right: 44 sec; Left: 40 sec Goal status: INITIAL  5.  Pt will demonstrate ability to retrieve lightweight object at 110* shoulder flexion and -10 elbow extension with RUE Baseline: shoulder flexion 105, elbow extension -20 Goal status: INITIAL  6.  Pt will demonstrate ability to retrieve lightweight object at 110* shoulder flexion and -15 elbow extension with LUE Baseline: shoulder flexion 105, elbow extension -20 Goal status: inital  ASSESSMENT:  CLINICAL IMPRESSION: Pt progressing towards goals. Pt limited by cognitive and visual/perceptual deficits.  Pt benefits from questioning cueing.    PERFORMANCE DEFICITS: in functional skills including ADLs, IADLs, coordination, dexterity, proprioception, sensation, tone, ROM, strength, flexibility, Fine motor control, Gross motor control, mobility, balance, endurance, decreased knowledge of precautions, decreased knowledge of use of DME, vision, and UE functional use, cognitive skills including energy/drive, learn, memory, problem solving, safety awareness, thought, and understand, and psychosocial skills including coping strategies, environmental adaptation, habits, interpersonal interactions, and routines and behaviors.    IMPAIRMENTS: are limiting patient from ADLs, IADLs, rest and sleep, play, leisure, and social participation.   CO-MORBIDITIES: may have co-morbidities  that affects occupational performance. Patient will benefit from skilled OT to address above impairments and improve overall function.  MODIFICATION OR ASSISTANCE TO COMPLETE EVALUATION: No modification of tasks or assist necessary to complete an evaluation.  OT OCCUPATIONAL PROFILE AND HISTORY: Detailed assessment: Review of records and additional review of physical, cognitive, psychosocial history related to current functional performance.  CLINICAL DECISION MAKING: LOW - limited treatment options, no task modification necessary  REHAB POTENTIAL: Good  EVALUATION COMPLEXITY: Low   PLAN:  OT FREQUENCY: 2x/week  OT DURATION: 12 weeks  PLANNED INTERVENTIONS: self care/ADL training, therapeutic exercise, therapeutic activity, neuromuscular re-education, manual therapy, passive range of motion, gait training, balance training, stair training, functional mobility training, aquatic therapy, splinting, paraffin, fluidotherapy, moist heat, cryotherapy, contrast bath, patient/family education, cognitive remediation/compensation, visual/perceptual remediation/compensation, energy conservation, coping strategies training, DME and/or AE instructions, and Re-evaluation  RECOMMENDED OTHER SERVICES: PT, ST  CONSULTED AND AGREED WITH PLAN OF CARE: Patient and family member/caregiver  PLAN FOR NEXT SESSION:  continue to work on attention, visual scanning, coordination   Hans Eden, OTR/L 06/22/2022, 10:59 AM

## 2022-06-22 NOTE — Therapy (Signed)
OUTPATIENT PHYSICAL THERAPY NEURO TREATMENT and RECERT   Patient Name: Antonio Adams MRN: [id number] DOB:11-30-1945, 77 y.o., male Today's Date: 06/22/2022   PCP: Scarlette Calico, MD REFERRING PROVIDER: Pricilla Holm, MD  END OF SESSION:  PT End of Session - 06/22/22 1021     Visit Number 7    Number of Visits 17    Date for PT Re-Evaluation 07/22/22   updated POC   Authorization Type [employer] Medicare    Progress Note Due on Visit 10    PT Start Time 1017    PT Stop Time 1100    PT Time Calculation (min) 43 min    Equipment Utilized During Treatment Gait belt    Activity Tolerance Patient tolerated treatment well;Other (comment)   Elevated diastolic   Behavior During Therapy WFL for tasks assessed/performed             Past Medical History:  Diagnosis Date   ADENOCARCINOMA, PROSTATE, GLEASON GRADE 6 12/21/2009   prostate cancer   ALLERGIC RHINITIS 03/16/2007   Allergy    Anxiety    Arthritis    back    BACK PAIN WITH RADICULOPATHY 11/23/2008   Cancer of kidney ([place])    partial right kidney removed   CARPAL TUNNEL SYNDROME, BILATERAL 03/16/2007   Chronic back pain    Constipation    takes Senokot daily   Depression    occasionally   DIABETES MELLITUS, TYPE II 03/16/2007   takes Januvia and MEtformin daily   GASTROINTESTINAL HEMORRHAGE, HX OF 03/16/2007   GERD 03/16/2007   takes Omeprazole daily   Glaucoma    mild - no eye drops   Hemorrhoids    History of colon polyps    HYPERLIPIDEMIA 03/16/2007   takes Crestor daily   HYPERTENSION 03/16/2007   takes Diltiazem and Lisinopril daily    Kidney cancer, primary, with metastasis from kidney to other site [facility])    LEG CRAMPS 05/22/2007   Nocturia    Overactive bladder    PARESTHESIA 05/21/2007   Proctitis    PTSD (post-traumatic stress disorder)    wakes up may be dreaming of fighting   Rectal bleeding    Dr.Norins has explained its from the Radiation that he has received   Rectal bleeding    Renal cell  carcinoma ([place])    Sleep apnea    uses CPAP nightly   Stomach cancer ([place])    Urinary frequency    takes Toviaz daily   Past Surgical History:  Procedure Laterality Date   ADRENALECTOMY Right 02/26/2020   Procedure: OPEN RIGHT RENAL CELL METASTATECTOMY AND  ADRENALECTOMY;  Surgeon: [name], MD;  Location: [facility];  Service: Urology;  Laterality: Right;   [place], 2008   repeat surgery; ESI '08   [place]     bilateral   CERVICAL FUSION     COLONOSCOPY     colonosocpy     ESOPHAGOGASTRODUODENOSCOPY     ED   FLEXIBLE SIGMOIDOSCOPY N/A 01/08/2017   Procedure: FLEXIBLE SIGMOIDOSCOPY;  Surgeon: Irene Shipper, MD;  Location: [place];  Service: Endoscopy;  Laterality: N/A;   HOT HEMOSTASIS N/A 01/08/2017   Procedure: HOT HEMOSTASIS (ARGON PLASMA COAGULATION/BICAP);  Surgeon: Irene Shipper, MD;  Location: [facility]COPY;  Service: Endoscopy;  Laterality: N/A;   IR RADIOLOGIST EVAL & MGMT  12/17/2019   IR RADIOLOGIST EVAL & MGMT  07/01/2020   IR RADIOLOGIST EVAL & MGMT  08/25/2020   LUMBAR LAMINECTOMY  10/30/2011   Procedure: MICRODISCECTOMY LUMBAR LAMINECTOMY;  Surgeon: Jessy Oto, MD;  Location: [place];  Service: Orthopedics;  Laterality: Right;  Right L4-5 and L5-S1 Microdiscectomy   LUMBAR LAMINECTOMY  06/17/2012   Procedure: MICRODISCECTOMY LUMBAR LAMINECTOMY;  Surgeon: Jessy Oto, MD;  Location: [place];  Service: Orthopedics;  Laterality: N/A;  Right L5-S1 microdiscectomy   OPEN PARTIAL HEPATECTOMY  N/A 02/26/2020   Procedure: OPEN PARTIAL HEPATECTOMY, CHOLECYSTECTOMY;  Surgeon: Stark Klein, MD;  Location: [facility];  Service: General;  Laterality: N/A;   POLYPECTOMY     PROSTATE SURGERY  march 2012   seed implant   RADIOFREQUENCY ABLATION N/A 07/21/2020   Procedure: CT MICROWAVE ABLATION;  Surgeon: Criselda Peaches, MD;  Location: [facility];  Service: Anesthesiology;  Laterality: N/A;   ROBOTIC ASSITED PARTIAL NEPHRECTOMY Right 09/08/2015   Procedure:  XI ROBOTIC ASSITED RIGHT PARTIAL NEPHRECTOMY;  Surgeon: [name], MD;  Location: [facility];  Service: Urology;  Laterality: Right;  Clamp on: 1033 Clamp off: 1103 Total Clamp Time: 30 minutes   SHOULDER ARTHROSCOPY WITH SUBACROMIAL DECOMPRESSION Left 01/20/2016   Procedure: LEFT SHOULDER ARTHROSCOPY WITH EXTENSIVE  DEBRIDEMENT, ACROMIOPLASTY;  Surgeon: Ninetta Lights, MD;  Location: [place];  Service: Orthopedics;  Laterality: Left;   SHOULDER SURGERY  LFT   Stress Cardiolite  12/24/2003   negative for ischemia   UPPER ESOPHAGEAL ENDOSCOPIC ULTRASOUND (EUS)  04/18/2016   [facility]   UPPER GASTROINTESTINAL ENDOSCOPY     WOUND EXPLORATION     management of a wound that he sustained in the [place]   Patient Active Problem List   Diagnosis Date Noted   Vaccine for VZV (varicella-zoster virus) 06/12/2022   Hx of completed stroke 04/28/2022   Abnormal brain CT 04/07/2022   Encounter for general adult medical examination with abnormal findings 01/23/2022   Anemia due to zinc deficiency 12/20/2021   Low back pain 12/15/2021   Symptomatic anemia 12/15/2021   Cancer related pain 12/15/2021   Chronic heart failure with preserved ejection fraction (HFpEF) ([place]) 08/12/2021   Tinea cruris 08/12/2021   Anemia due to chronic kidney disease 07/20/2021   CKD (chronic kidney disease) stage 5, GFR less than 15 ml/min (HCC) 05/24/2021   BPH (benign prostatic hyperplasia) 05/24/2021   Diabetic renal disease ([place]) 05/20/2021   Dry eye syndrome of bilateral lacrimal glands 05/20/2021   Senile nuclear sclerosis 05/20/2021   Vitamin D deficiency 05/20/2021   Vitreous degeneration, unspecified eye 05/20/2021   Intrinsic eczema 04/18/2021   Atherosclerosis of aorta ([place]) 12/30/2020   Mixed conductive and sensorineural hearing loss of left ear with restricted hearing of right ear 09/01/2020   Tinnitus of both ears 05/20/2020   Metastatic renal cell carcinoma ([place]) 02/26/2020    Diabetic polyneuropathy associated with type 2 diabetes mellitus ([place]) 12/18/2019   Murmur, cardiac 10/08/2018   DDD (degenerative disc disease), cervical 10/08/2018   OAB (overactive bladder) 04/04/2018   Seasonal allergic rhinitis due to pollen 11/01/2016   Microalbuminuria due to type 2 diabetes mellitus ([place]) 07/27/2016   Varicose veins of both lower extremities 08/06/2015   Varicose veins of lower extremities with ulcer ([place]) 06/16/2015   Internal hemorrhoid, bleeding 08/03/2014   Post traumatic stress disorder (PTSD) 01/29/2014   Spinal stenosis of lumbar region with radiculopathy 10/30/2011    Class: Chronic   Obstructive sleep apnea 11/24/2010   Obesity 11/24/2010   Routine general medical examination at a health care facility 11/24/2010   ADENOCARCINOMA, PROSTATE, GLEASON  GRADE 6 12/21/2009   Essential hypertension 03/16/2007   GERD 03/16/2007    ONSET DATE: 04/07/22  REFERRING DIAG: Z86.73 (ICD-10-CM) - Hx of completed stroke  THERAPY DIAG:  Muscle weakness (generalized)  Unsteadiness on feet  Other abnormalities of gait and mobility  Rationale for Evaluation and Treatment: Rehabilitation  SUBJECTIVE:                                                                                                                                                                                             SUBJECTIVE STATEMENT: Wife reports he has had his medicine around 9am this morning.  He denies falls or pain today. Pt accompanied by: significant other [place])  PERTINENT HISTORY: GEAROLD WAINER is a 77 y.o. R-handed male with history of DM2 with diabetic polyneuropathy, OSA on nightly CPAP, PTSD, history of lumbar stenosis with chronic back pain, metastatic renal carcinoma with lymphadenopathy and questionable hepatic involvement noted in 2022, not on chemo due to poor tolerance to the medications, and without significant progression since May 2023, only on surveillance imaging,  stage IV, eczema, vitamin D deficiency, CKD stage V refusing HD, BPH, anemia of CKD  Zinc deficiency, chronic heart failure, mild cognitive impairment, presenting for evaluation of left PCA territory acute CVA.  PAIN:  Are you having pain? No  PRECAUTIONS: Fall  WEIGHT BEARING RESTRICTIONS: No  OBJECTIVE:  TODAY'S TREATMENT:                                                                                                                            Vitals this session:   151/91 Following walking from SLP office to gym in LUE 169/93 following gait speed and stair assessment 161/90 following 3 mins seated rest break 164/89 following FGA testing    [facility] PT Assessment - 06/22/22 1036       Functional Gait  Assessment   Gait assessed  Yes    Gait Level Surface Walks 20 ft, slow speed, abnormal gait pattern, evidence for imbalance or deviates 10-15 in outside of the 12 in walkway width. Requires more  than 7 sec to ambulate 20 ft.    Change in Gait Speed Able to change speed, demonstrates mild gait deviations, deviates 6-10 in outside of the 12 in walkway width, or no gait deviations, unable to achieve a major change in velocity, or uses a change in velocity, or uses an assistive device.    Gait with Horizontal Head Turns Performs head turns with moderate changes in gait velocity, slows down, deviates 10-15 in outside 12 in walkway width but recovers, can continue to walk.    Gait with Vertical Head Turns Performs task with moderate change in gait velocity, slows down, deviates 10-15 in outside 12 in walkway width but recovers, can continue to walk.    Gait and Pivot Turn Pivot turns safely in greater than 3 sec and stops with no loss of balance, or pivot turns safely within 3 sec and stops with mild imbalance, requires small steps to catch balance.    Step Over Obstacle Is able to step over one shoe box (4.5 in total height) but must slow down and adjust steps to clear box safely. May require  verbal cueing.    Gait with Narrow Base of Support Ambulates less than 4 steps heel to toe or cannot perform without assistance.    Gait with Eyes Closed Walks 20 ft, slow speed, abnormal gait pattern, evidence for imbalance, deviates 10-15 in outside 12 in walkway width. Requires more than 9 sec to ambulate 20 ft.    Ambulating Backwards Walks 20 ft, uses assistive device, slower speed, mild gait deviations, deviates 6-10 in outside 12 in walkway width.    Steps Alternating feet, must use rail.    Total Score 13    FGA comment: < 19 = high risk fall            RAMP:  Level of Assistance: SBA and CGA Assistive device utilized: Single point cane Ramp Comments: He has a tendency to veer to the R on uneven surfaces, needing cuing and CGA to correct.     STAIRS:  Level of Assistance: SBA  Stair Negotiation Technique: Alternating Pattern  with Single Rail on Right  Number of Stairs: 4   Height of Stairs: 6  Comments: n/a  GAIT: Gait pattern: step through pattern, decreased ankle dorsiflexion- Left, Left foot flat, trendelenburg, lateral hip instability, and trunk flexed Distance walked: 500' outdoors and 200' indoors  Assistive device utilized: Single point cane Level of assistance: SBA, CGA, and Min A Comments: Pt does well indoors on solid ground without cane during FGA testing and walking to/from task however when walking outdoors, he tries to not utilize cane and veers to the R quite often and even bumped into vehicles at times despite cuing from PT.  He needs up to min A to correct at times.    PATIENT EDUCATION: Education details: Importance of compliance of HEP Person educated: Patient and Spouse Education method: Explanation, Demonstration, and Verbal cues Education comprehension: verbalized understanding and needs further education  HOME EXERCISE PROGRAM:   Clinical References  HIP: Extension, Bridging Bilateral    Place feet on surface. Tighten glutes, raise hips  up. 12 reps per set, 1 sets per day, 4-5 days per week   Copyright  VHI. All rights reserved.    HIP: Extension / KNEE: Flexion, Standing    Bend knee, squeeze glutes. Move leg backward. 10 reps per set, 1 sets per day, 4-5 days per week Hold onto a support.  Copyright  VHI. All rights  reserved.    Band Walk: Side Stepping    Tie band around legs, just above knees. Step 10 feet to one side, then step back to start. Repeat 40 feet per session. Note: Small towel between band and skin eases rubbing.   http://plyo.exer.us/76   Copyright  VHI. All rights reserved.    Marching In-Place    Standing straight, alternate bringing knees toward trunk. Arms swing alternately. Do 2 sets. Do 20 times per day.  Copyright  VHI. All rights reserved.    Sit to Stand / Stand to Sit / Transfers    Sit on edge of a solid chair with arms, feet flat on floor. Lean forward over feet and stand up with hands on chair arms. Sit down slowly with hands on chair arms. Repeat 10 times per session. Do 1-2 sessions per day.  Copyright  VHI. All rights reserved.    Access Code: [email or web address] URL: [email or web address] Date: 06/01/2022 Prepared by: Elease Etienne  Exercises - Standing Single Leg Stance with Counter Support  - 1 x daily - 4 x weekly - 1 sets - 2 reps - 30 seconds hold - Standing Tandem Balance with Counter Support  - 1 x daily - 4 x weekly - 1 sets - 2 reps - 30 seconds hold - Corner Balance Feet Together With Eyes Closed  - 1 x daily - 5 x weekly - 1 sets - 2-3 reps - 30 seconds hold  GOALS: Goals reviewed with patient? Yes  SHORT TERM GOALS: Target date: 05/26/22  Pt will be IND with initial HEP in order to indicate improved functional mobility and dec fall risk. Baseline:  Pt states compliance and no issues (1/18) Goal status: MET  2.  Pt will improve gait speed to >/=2.62 ft/sec w/ SPC in order to indicate dec fall risk.  Baseline: 2.00 ft/sec; 1.90  ft/sec (1/18) Goal status: NOT MET  3.  Pt will ambulate 90' without at mod I level with only minimal Trendelenburg in order to indicate more independent household ambulation.   Baseline: ongoing compensated right trendelenburg x200' SBA (1/18) Goal status: IN PROGRESS  4.  Pt will improve FGA to >/=18/30 in order to indicate dec fall risk.   Baseline: 12/30; 11/30 (1/23) Goal status: NOT MET  5.  Pt will improve 5TSS to </=30 secs without UE support only in order to indicate dec fall risk and improved functional strength.   Baseline: approx 37 secs with hands on lap and mild anterior LOB; 26.59 sec no UE support or LOB (1/18) Goal status: MET  6.  Pt will negotiate up/down 4 steps with single rail/cane, up/down ramp and curb and ambulate x 500' outdoors over unlevel paved surfaces at S level in order to indicate improved community mobility.   Baseline: 4 stairs right rail, curb and ramp SBA (1/18); pt declines to ambulate outside due to cold weather (1/23) Goal status: IN PROGRESS  UPDATED LONG TERM GOALS: Target date: 07/22/22  Pt will be IND with final HEP in order to indicate improved functional mobility and dec fall risk. Baseline:  Goal status: ONGOING  2.  Pt will improve FGA to >/=23/30 in order to indicate dec fall risk.   Baseline: 13/30 06/22/22 Goal status: ONGOING  3.  Pt will improve 5TSS to </=20 secs without UE support only in order to indicate dec fall risk and improved functional strength.   Baseline: 27.22 with hands on lap then use of arm rests Goal status: ONGOING  4.  Pt will improve gait speed to >/=2.62 ft/sec w/o AD in order to indicate dec fall risk.  Baseline: 2.38 ft/sec 06/22/22 Goal status: ONGOING   5.  Pt will negotiate up/down 4 steps with single rail, up/down ramp and curb and ambulate x 500' outdoors over unlevel paved surfaces without device at S level in order to indicate improved community mobility.   Baseline:  Goal status:  ONGOING      ASSESSMENT:  CLINICAL IMPRESSION: Skilled session focused on assessment of LTGs.  Pt has met 1/6, partially meeting 2/6 goals.  He has made slight progress with FGA and gait speed but not to goal levels.  He also seems to be very inconsistent with doing HEP.  He has only been seen for 7 visits, so will recert for 4 more weeks to see if pt can make any more progress.   OBJECTIVE IMPAIRMENTS: Abnormal gait, decreased activity tolerance, decreased balance, decreased endurance, decreased mobility, decreased strength, impaired perceived functional ability, and postural dysfunction.   ACTIVITY LIMITATIONS: carrying, lifting, bending, standing, squatting, stairs, transfers, and locomotion level  PARTICIPATION LIMITATIONS: meal prep, cleaning, medication management, personal finances, driving, and community activity  PERSONAL FACTORS: 3+ comorbidities: see above  are also affecting patient's functional outcome.   REHAB POTENTIAL: Good  CLINICAL DECISION MAKING: Evolving/moderate complexity  EVALUATION COMPLEXITY: Moderate  PLAN:  PT FREQUENCY: 2x/week  PT DURATION: 4 weeks  PLANNED INTERVENTIONS: Therapeutic exercises, Therapeutic activity, Neuromuscular re-education, Balance training, Gait training, Patient/Family education, Self Care, Stair training, Vestibular training, DME instructions, and Aquatic Therapy  PLAN FOR NEXT SESSION: Go through HEP and condense to 3-4 core exercises because he is not consistent at home, gait with and without cane, dynamic stability, hip extension, narrowed BOS, compliant surfaces, hamstring strength, could try blaze pods   Cameron Sprang, PT, MPT [facility] [address] [place], [place], [address] Phone: [phone]   Fax:  [phone] 06/22/22, 1:14 PM

## 2022-06-22 NOTE — Therapy (Signed)
OUTPATIENT SPEECH LANGUAGE PATHOLOGY TREATMENT   Patient Name: Antonio Adams MRN: [id number] DOB:1946/03/19, 77 y.o., male Today's Date: 06/22/2022  PCP: [name]., MD REFERRING PROVIDER: Hoyt Koch., MD  END OF SESSION:  End of Session - 06/22/22 0947     Visit Number 8    Number of Visits 25    Date for SLP Re-Evaluation 07/26/22    Authorization Type [employer] Medicare    SLP Start Time 0945    SLP Stop Time  1015    SLP Time Calculation (min) 30 min    Activity Tolerance Patient tolerated treatment well              Past Medical History:  Diagnosis Date   ADENOCARCINOMA, PROSTATE, GLEASON GRADE 6 12/21/2009   prostate cancer   ALLERGIC RHINITIS 03/16/2007   Allergy    Anxiety    Arthritis    back    BACK PAIN WITH RADICULOPATHY 11/23/2008   Cancer of kidney ([place])    partial right kidney removed   CARPAL TUNNEL SYNDROME, BILATERAL 03/16/2007   Chronic back pain    Constipation    takes Senokot daily   Depression    occasionally   DIABETES MELLITUS, TYPE II 03/16/2007   takes Januvia and MEtformin daily   GASTROINTESTINAL HEMORRHAGE, HX OF 03/16/2007   GERD 03/16/2007   takes Omeprazole daily   Glaucoma    mild - no eye drops   Hemorrhoids    History of colon polyps    HYPERLIPIDEMIA 03/16/2007   takes Crestor daily   HYPERTENSION 03/16/2007   takes Diltiazem and Lisinopril daily    Kidney cancer, primary, with metastasis from kidney to other site [facility])    LEG CRAMPS 05/22/2007   Nocturia    Overactive bladder    PARESTHESIA 05/21/2007   Proctitis    PTSD (post-traumatic stress disorder)    wakes up may be dreaming of fighting   Rectal bleeding    Dr.Norins has explained its from the Radiation that he has received   Rectal bleeding    Renal cell carcinoma ([place])    Sleep apnea    uses CPAP nightly   Stomach cancer ([employer])    Urinary frequency    takes Toviaz daily   Past Surgical History:  Procedure Laterality Date   ADRENALECTOMY  Right 02/26/2020   Procedure: OPEN RIGHT RENAL CELL METASTATECTOMY AND  ADRENALECTOMY;  Surgeon: [name], MD;  Location: [facility];  Service: Urology;  Laterality: Right;   [place], 2008   repeat surgery; ESI '08   [place]     bilateral   CERVICAL FUSION     COLONOSCOPY     colonosocpy     ESOPHAGOGASTRODUODENOSCOPY     ED   FLEXIBLE SIGMOIDOSCOPY N/A 01/08/2017   Procedure: FLEXIBLE SIGMOIDOSCOPY;  Surgeon: Irene Shipper, MD;  Location: [place];  Service: Endoscopy;  Laterality: N/A;   HOT HEMOSTASIS N/A 01/08/2017   Procedure: HOT HEMOSTASIS (ARGON PLASMA COAGULATION/BICAP);  Surgeon: Irene Shipper, MD;  Location: [facility];  Service: Endoscopy;  Laterality: N/A;   IR RADIOLOGIST EVAL & MGMT  12/17/2019   IR RADIOLOGIST EVAL & MGMT  07/01/2020   IR RADIOLOGIST EVAL & MGMT  08/25/2020   LUMBAR LAMINECTOMY  10/30/2011   Procedure: MICRODISCECTOMY LUMBAR LAMINECTOMY;  Surgeon: Jessy Oto, MD;  Location: [place];  Service: Orthopedics;  Laterality: Right;  Right L4-5 and L5-S1 Microdiscectomy   LUMBAR LAMINECTOMY  06/17/2012   Procedure: MICRODISCECTOMY LUMBAR LAMINECTOMY;  Surgeon: Jessy Oto, MD;  Location: [place];  Service: Orthopedics;  Laterality: N/A;  Right L5-S1 microdiscectomy   OPEN PARTIAL HEPATECTOMY  N/A 02/26/2020   Procedure: OPEN PARTIAL HEPATECTOMY, CHOLECYSTECTOMY;  Surgeon: Stark Klein, MD;  Location: [facility];  Service: General;  Laterality: N/A;   POLYPECTOMY     PROSTATE SURGERY  march 2012   seed implant   RADIOFREQUENCY ABLATION N/A 07/21/2020   Procedure: CT MICROWAVE ABLATION;  Surgeon: Criselda Peaches, MD;  Location: [facility];  Service: Anesthesiology;  Laterality: N/A;   ROBOTIC ASSITED PARTIAL NEPHRECTOMY Right 09/08/2015   Procedure: XI ROBOTIC ASSITED RIGHT PARTIAL NEPHRECTOMY;  Surgeon: [name], MD;  Location: [facility];  Service: Urology;  Laterality: Right;  Clamp on: 1033 Clamp off: 1103 Total Clamp Time: 30  minutes   SHOULDER ARTHROSCOPY WITH SUBACROMIAL DECOMPRESSION Left 01/20/2016   Procedure: LEFT SHOULDER ARTHROSCOPY WITH EXTENSIVE  DEBRIDEMENT, ACROMIOPLASTY;  Surgeon: Ninetta Lights, MD;  Location: [place];  Service: Orthopedics;  Laterality: Left;   SHOULDER SURGERY  LFT   Stress Cardiolite  12/24/2003   negative for ischemia   UPPER ESOPHAGEAL ENDOSCOPIC ULTRASOUND (EUS)  04/18/2016   [facility]   UPPER GASTROINTESTINAL ENDOSCOPY     WOUND EXPLORATION     management of a wound that he sustained in the [place]   Patient Active Problem List   Diagnosis Date Noted   Vaccine for VZV (varicella-zoster virus) 06/12/2022   Hx of completed stroke 04/28/2022   Abnormal brain CT 04/07/2022   Encounter for general adult medical examination with abnormal findings 01/23/2022   Anemia due to zinc deficiency 12/20/2021   Low back pain 12/15/2021   Symptomatic anemia 12/15/2021   Cancer related pain 12/15/2021   Chronic heart failure with preserved ejection fraction (HFpEF) ([place]) 08/12/2021   Tinea cruris 08/12/2021   Anemia due to chronic kidney disease 07/20/2021   CKD (chronic kidney disease) stage 5, GFR less than 15 ml/min (HCC) 05/24/2021   BPH (benign prostatic hyperplasia) 05/24/2021   Diabetic renal disease ([place]) 05/20/2021   Dry eye syndrome of bilateral lacrimal glands 05/20/2021   Senile nuclear sclerosis 05/20/2021   Vitamin D deficiency 05/20/2021   Vitreous degeneration, unspecified eye 05/20/2021   Intrinsic eczema 04/18/2021   Atherosclerosis of aorta ([place]) 12/30/2020   Mixed conductive and sensorineural hearing loss of left ear with restricted hearing of right ear 09/01/2020   Tinnitus of both ears 05/20/2020   Metastatic renal cell carcinoma ([place]) 02/26/2020   Diabetic polyneuropathy associated with type 2 diabetes mellitus ([place]) 12/18/2019   Murmur, cardiac 10/08/2018   DDD (degenerative disc disease), cervical 10/08/2018   OAB (overactive  bladder) 04/04/2018   Seasonal allergic rhinitis due to pollen 11/01/2016   Microalbuminuria due to type 2 diabetes mellitus ([place]) 07/27/2016   Varicose veins of both lower extremities 08/06/2015   Varicose veins of lower extremities with ulcer ([place]) 06/16/2015   Internal hemorrhoid, bleeding 08/03/2014   Post traumatic stress disorder (PTSD) 01/29/2014   Spinal stenosis of lumbar region with radiculopathy 10/30/2011    Class: Chronic   Obstructive sleep apnea 11/24/2010   Obesity 11/24/2010   Routine general medical examination at a health care facility 11/24/2010   ADENOCARCINOMA, PROSTATE, GLEASON GRADE 6 12/21/2009   Essential hypertension 03/16/2007   GERD 03/16/2007    ONSET DATE: 04-07-22   REFERRING DIAG: [id number] (ICD-10-CM) - Personal history of transient ischemic attack (TIA), and cerebral  infarction without residual deficits   THERAPY DIAG:  Cognitive communication deficit  Rationale for Evaluation and Treatment: Rehabilitation  SUBJECTIVE:   SUBJECTIVE STATEMENT: Pt and wife reported that they purchase a timer for their lamp and he navigated the ATM well.    PAIN: Are you having pain? No  OBJECTIVE:    TODAY'S TREATMENT:   06-22-22: Pt running 15 minutes late, accompanied by spouse. Wife endorsed that pt still has difficulty remembering to take meds sometimes. SLP re-educated pt and spouse on importance of routine to aid memory. Suggested keeping medication in the same place instead of moving them between two different rooms. Plan is to keep medications in kitchen, but place the meds he takes at night in a separate container in the bedroom. SLP provided signage as external memory aid for pt to hang in kitchen as reminder to take medications in morning. Reminded pt to keep sign in place where it will catch his attention and to move it where it will be most helpful.  Pt reported that he is good with paying bills, calculating tips at restaurants, and managing money  overall. Spouse expressed concern for slow processing with math calculations. SLP facilitated discussion on benefits of taking time and checking calculations to ensure accuracy.   06-20-22: Pt accompanied by wife. Pt reported difficulty sleeping over the past few nights d/t forgetting to turn light off. SLP suggested purchasing a timer to turn light off at a certain time, to which pt was receptive. Gathered resources on where to find item and also recommended schedule management strategies, such as purchasing a digital clock that shows the date. SLP provided re-education concerning importance of maintaining a consistent sleep schedule for overall health and cognition.  Pt stated "I think everything is going pretty well." Pt reported bill paying is going well. Pt continues to demonstrate decreased motivation and awareness of deficits. Requires encouragement (frequent verbal cues) to participate in therapeutic tasks and provide sufficient detail in responses.Pt reported that grocery shopping is going well; he has been remembering what he needs to buy.  Wife endorsed that she has "seen improvement". Clinician facilitated checkbook balancing activity, providing consistent verbal and visual cues to guide pt through process. Pt with breakdowns in knowing where to place values and complete computations to balance. Verbal and tactile cues required for using calculator to aid in calculations.  HEP: Use the ATM and go through the process of taking out money to practice the steps/sequence.   06-15-22: Reports improved sleeping habits secondary to recommendations from primary SLP, which has improved cognitive functioning during the day. Good carryover of medication recommendations reported. Pt provided pill box as example with AM medication taken. Discussed cognitive strategies for bill management, including writing date of calendar when bill arrives. Pt and fiance verbalized agreement. Suspects reduced intellectual and  emergent awareness, as pt reported some simple mistakes during usual routine. Pt often attempted to explain away deficits when possible deficit identified. Recommended extra processing time prior to initiating task ("stop and think") and double checking during/after task to ensure accurate completion. See patient instructions with recs.   PATIENT EDUCATION: Education details: see above Person educated: Patient and Spouse Education method: Explanation, Demonstration, and Verbal cues Education comprehension: verbalized understanding, returned demonstration, and needs further education   GOALS: Goals reviewed with patient? Yes  SHORT TERM GOALS: Target date: 05-31-22 (addressed today)  Pt will complete CLQT and PROM initial therapy session Baseline: Goal status: MET  2.  Pt will accurately sort medications with  use of compensations PRN into pill box with occasional mod-A Baseline:  Goal status: MET  3.  Pt will generate visual aid to assist with increased independence in paying bills with A from SLP and s/o Baseline:  Goal status: PARTIALLY MET  4.  Pt will verbalize appropriate memory strategy given functional scenario with 80% accuracy   Baseline:  Goal status: NOT MET  5.  Pt and s/o will implement external A for orientation (e.g. calendar) with usual cues from care partner to initiate use  Baseline:  Goal status: PARTIALLY MET   LONG TERM GOALS: Target date: 07-26-22  With use of external aid, pt will answer orientation questions with 100% accuracy over 2 sessions Baseline:  Goal status: IN PROGRESS  2.  Pt will verbalize x3 situations in which he successfully employed a memory strategy resulting in subjective satisfaction with ability to recall desired information Baseline:  Goal status: IN PROGRESS  3.  Pt will pay x3 of own bills with supervision from s/o, with use of compensations PRN Baseline:  Goal status: IN PROGRESS  4.  Pt will report improvement via PROM by  dc Baseline:  Goal status: IN PROGRESS  ASSESSMENT:  CLINICAL IMPRESSION: Pt reports that he was successful navigating ATM to take out money and has purchased a timer for his light, which will help his sleep routine. Spouse expresses concern for pt taking medication consistently, particularly in morning. SLP provided external visual aids for pt and re-educated pt and wife concerning importance of establishing routine when possible.Pt continues to benefit from skilled ST services for cognition.   OBJECTIVE IMPAIRMENTS: include attention, memory, awareness, executive functioning, and expressive language. These impairments are limiting patient from managing medications, managing appointments, managing finances, household responsibilities, and ADLs/IADLs. Factors affecting potential to achieve goals and functional outcome are ability to learn/carryover information and cooperation/participation level.. Patient will benefit from skilled SLP services to address above impairments and improve overall function.  REHAB POTENTIAL: Fair (carryover and participation)  PLAN:  SLP FREQUENCY: 2x/week  SLP DURATION: 12 weeks  PLANNED INTERVENTIONS: Cueing hierachy, Cognitive reorganization, Internal/external aids, Functional tasks, SLP instruction and feedback, Compensatory strategies, and Patient/family education    Leroy Libman, [place] 06/22/2022, 9:49 AM

## 2022-06-22 NOTE — Telephone Encounter (Signed)
Rx request sent to pharmacy.  

## 2022-06-27 ENCOUNTER — Ambulatory Visit: Payer: Medicare Other | Admitting: Occupational Therapy

## 2022-06-27 ENCOUNTER — Ambulatory Visit: Payer: Medicare Other | Admitting: Physical Therapy

## 2022-06-27 ENCOUNTER — Ambulatory Visit: Payer: Medicare Other | Admitting: Speech Pathology

## 2022-06-28 NOTE — Therapy (Addendum)
OUTPATIENT OCCUPATIONAL THERAPY NEURO TREATMENT  Patient Name: Antonio Adams MRN: [id number] DOB:09-Dec-1945, 77 y.o., male Today's Date: 06/29/2022  PCP: Dr. Sharlet Salina REFERRING PROVIDER: Dr. Sharlet Salina  END OF SESSION:  OT End of Session - 06/29/22 0951     Visit Number 8    Number of Visits 25    Date for OT Re-Evaluation 07/26/22    Authorization Type [employer]    Authorization - Visit Number 8    Authorization - Number of Visits 10    Progress Note Due on Visit 10    OT Start Time 1105    OT Stop Time 1143    OT Time Calculation (min) 38 min              Past Medical History:  Diagnosis Date   ADENOCARCINOMA, PROSTATE, GLEASON GRADE 6 12/21/2009   prostate cancer   ALLERGIC RHINITIS 03/16/2007   Allergy    Anxiety    Arthritis    back    BACK PAIN WITH RADICULOPATHY 11/23/2008   Cancer of kidney ([place])    partial right kidney removed   CARPAL TUNNEL SYNDROME, BILATERAL 03/16/2007   Chronic back pain    Constipation    takes Senokot daily   Depression    occasionally   DIABETES MELLITUS, TYPE II 03/16/2007   takes Januvia and MEtformin daily   GASTROINTESTINAL HEMORRHAGE, HX OF 03/16/2007   GERD 03/16/2007   takes Omeprazole daily   Glaucoma    mild - no eye drops   Hemorrhoids    History of colon polyps    HYPERLIPIDEMIA 03/16/2007   takes Crestor daily   HYPERTENSION 03/16/2007   takes Diltiazem and Lisinopril daily    Kidney cancer, primary, with metastasis from kidney to other site [facility])    LEG CRAMPS 05/22/2007   Nocturia    Overactive bladder    PARESTHESIA 05/21/2007   Proctitis    PTSD (post-traumatic stress disorder)    wakes up may be dreaming of fighting   Rectal bleeding    Dr.Norins has explained its from the Radiation that he has received   Rectal bleeding    Renal cell carcinoma ([place])    Sleep apnea    uses CPAP nightly   Stomach cancer ([place])    Urinary frequency    takes Toviaz daily   Past Surgical History:  Procedure  Laterality Date   ADRENALECTOMY Right 02/26/2020   Procedure: OPEN RIGHT RENAL CELL METASTATECTOMY AND  ADRENALECTOMY;  Surgeon: [name], MD;  Location: [facility];  Service: Urology;  Laterality: Right;   [place], 2008   repeat surgery; ESI '08   [place]     bilateral   CERVICAL FUSION     COLONOSCOPY     colonosocpy     ESOPHAGOGASTRODUODENOSCOPY     ED   FLEXIBLE SIGMOIDOSCOPY N/A 01/08/2017   Procedure: FLEXIBLE SIGMOIDOSCOPY;  Surgeon: Irene Shipper, MD;  Location: [place];  Service: Endoscopy;  Laterality: N/A;   HOT HEMOSTASIS N/A 01/08/2017   Procedure: HOT HEMOSTASIS (ARGON PLASMA COAGULATION/BICAP);  Surgeon: Irene Shipper, MD;  Location: [facility] ENDOSCOPY;  Service: Endoscopy;  Laterality: N/A;   IR RADIOLOGIST EVAL & MGMT  12/17/2019   IR RADIOLOGIST EVAL & MGMT  07/01/2020   IR RADIOLOGIST EVAL & MGMT  08/25/2020   LUMBAR LAMINECTOMY  10/30/2011   Procedure: MICRODISCECTOMY LUMBAR LAMINECTOMY;  Surgeon: Jessy Oto, MD;  Location: [place];  Service: Orthopedics;  Laterality:  Right;  Right L4-5 and L5-S1 Microdiscectomy   LUMBAR LAMINECTOMY  06/17/2012   Procedure: MICRODISCECTOMY LUMBAR LAMINECTOMY;  Surgeon: Jessy Oto, MD;  Location: [place];  Service: Orthopedics;  Laterality: N/A;  Right L5-S1 microdiscectomy   OPEN PARTIAL HEPATECTOMY  N/A 02/26/2020   Procedure: OPEN PARTIAL HEPATECTOMY, CHOLECYSTECTOMY;  Surgeon: Stark Klein, MD;  Location: [facility];  Service: General;  Laterality: N/A;   POLYPECTOMY     PROSTATE SURGERY  march 2012   seed implant   RADIOFREQUENCY ABLATION N/A 07/21/2020   Procedure: CT MICROWAVE ABLATION;  Surgeon: Criselda Peaches, MD;  Location: [facility];  Service: Anesthesiology;  Laterality: N/A;   ROBOTIC ASSITED PARTIAL NEPHRECTOMY Right 09/08/2015   Procedure: XI ROBOTIC ASSITED RIGHT PARTIAL NEPHRECTOMY;  Surgeon: [name], MD;  Location: [facility];  Service: Urology;  Laterality: Right;  Clamp on: 1033 Clamp  off: 1103 Total Clamp Time: 30 minutes   SHOULDER ARTHROSCOPY WITH SUBACROMIAL DECOMPRESSION Left 01/20/2016   Procedure: LEFT SHOULDER ARTHROSCOPY WITH EXTENSIVE  DEBRIDEMENT, ACROMIOPLASTY;  Surgeon: Ninetta Lights, MD;  Location: [place];  Service: Orthopedics;  Laterality: Left;   SHOULDER SURGERY  LFT   Stress Cardiolite  12/24/2003   negative for ischemia   UPPER ESOPHAGEAL ENDOSCOPIC ULTRASOUND (EUS)  04/18/2016   [facility]   UPPER GASTROINTESTINAL ENDOSCOPY     WOUND EXPLORATION     management of a wound that he sustained in the [place]   Patient Active Problem List   Diagnosis Date Noted   Vaccine for VZV (varicella-zoster virus) 06/12/2022   Hx of completed stroke 04/28/2022   Abnormal brain CT 04/07/2022   Encounter for general adult medical examination with abnormal findings 01/23/2022   Anemia due to zinc deficiency 12/20/2021   Low back pain 12/15/2021   Symptomatic anemia 12/15/2021   Cancer related pain 12/15/2021   Chronic heart failure with preserved ejection fraction (HFpEF) ([place]) 08/12/2021   Tinea cruris 08/12/2021   Anemia due to chronic kidney disease 07/20/2021   CKD (chronic kidney disease) stage 5, GFR less than 15 ml/min (HCC) 05/24/2021   BPH (benign prostatic hyperplasia) 05/24/2021   Diabetic renal disease ([place]) 05/20/2021   Dry eye syndrome of bilateral lacrimal glands 05/20/2021   Senile nuclear sclerosis 05/20/2021   Vitamin D deficiency 05/20/2021   Vitreous degeneration, unspecified eye 05/20/2021   Intrinsic eczema 04/18/2021   Atherosclerosis of aorta ([place]) 12/30/2020   Mixed conductive and sensorineural hearing loss of left ear with restricted hearing of right ear 09/01/2020   Tinnitus of both ears 05/20/2020   Metastatic renal cell carcinoma ([place]) 02/26/2020   Diabetic polyneuropathy associated with type 2 diabetes mellitus ([place]) 12/18/2019   Murmur, cardiac 10/08/2018   DDD (degenerative disc disease), cervical  10/08/2018   OAB (overactive bladder) 04/04/2018   Seasonal allergic rhinitis due to pollen 11/01/2016   Microalbuminuria due to type 2 diabetes mellitus ([place]) 07/27/2016   Varicose veins of both lower extremities 08/06/2015   Varicose veins of lower extremities with ulcer ([place]) 06/16/2015   Internal hemorrhoid, bleeding 08/03/2014   Post traumatic stress disorder (PTSD) 01/29/2014   Spinal stenosis of lumbar region with radiculopathy 10/30/2011    Class: Chronic   Obstructive sleep apnea 11/24/2010   Obesity 11/24/2010   Routine general medical examination at a health care facility 11/24/2010   ADENOCARCINOMA, PROSTATE, GLEASON GRADE 6 12/21/2009   Essential hypertension 03/16/2007   GERD 03/16/2007    ONSET DATE: 04/07/22  REFERRING DIAG: Diagnosis  Z32.73 (ICD-10-CM) - Personal history of transient ischemic attack (TIA), and cerebral infarction without residual deficits  THERAPY DIAG:  Muscle weakness (generalized)  Other lack of coordination  Visuospatial deficit  Attention and concentration deficit  Unsteadiness on feet  Rationale for Evaluation and Treatment: Rehabilitation  SUBJECTIVE:   SUBJECTIVE STATEMENT: No pain  Pt accompanied by: significant other fiancee [name]  PERTINENT HISTORY: 77 y.o. R-handed male with history of DM2 with diabetic polyneuropathy, OSA on nightly CPAP, PTSD, history of lumbar stenosis with chronic back pain, metastatic renal carcinoma with lymphadenopathy and questionable hepatic involvement noted in 2022, not on chemo due to poor tolerance to the medications, and without significant progression since May 2023, only on surveillance imaging, stage IV, eczema, vitamin D deficiency, CKD stage V refusing HD, BPH, anemia of CKD  Zinc deficiency, chronic heart failure, mild cognitive impairment, s/p left PCA territory acute CVA on 04/07/22   DIAGNOSTIC FINDINGS:  MRI of the brain showing a left PCA large infarct and punctate right PCA  infarct MRA remarkable for left PCA P3 branch occlusion, ICA siphon atherosclerosis with possible tandem significant stenosis of the right siphon  PRECAUTIONS: Fall, hx of CA  WEIGHT BEARING RESTRICTIONS: No  PAIN:  Are you having pain? No  FALLS: Has patient fallen in last 6 months? Yes. Number of falls 1  LIVING ENVIRONMENT: Lives with: lives alone, fiancee is there most of the time Lives in: House/apartment Stairs: 1 level stairs to enter Has following equipment at home: Single point cane  PLOF: Independent  PATIENT GOALS: to get right  OBJECTIVE:   HAND DOMINANCE: Right  ADLs: Overall ADLs: fall risk in standing Transfers/ambulation related to [place] Eating: mod I Grooming: mod I UB Dressing: mod I  LB Dressing: supervision, increased time required, therapist recommends pt sits for LB dressing due to fall risk Toileting: mod I using cane Bathing: supervision Tub Shower transfers: min A currently standing in shower, no grab bars, using tub shower does not have seat Equipment: none  IADLs: Shopping: has assistance Light housekeeping: fiancee handles Meal Prep: does not cook Community mobility: ambulates with cane, supervision-minguard, unsteady and walks close to items Medication management: fiancee is helping with meds Financial management: fiancee is assisting  Handwriting: 100% legible  MOBILITY STATUS:  ambulates with minguard with cane , some unsteadiness and walks close to walls and doorways   FUNCTIONAL OUTCOME MEASURES: FOTO: TBA  UPPER EXTREMITY ROM:  remaining AROM is WFLS   Active ROM Right eval Left eval  Shoulder flexion 105 105  Shoulder abduction    Shoulder adduction    Shoulder extension    Shoulder internal rotation    Shoulder external rotation    Elbow flexion    Elbow extension -15 -20  Wrist flexion    Wrist extension    Wrist ulnar deviation    Wrist radial deviation    Wrist pronation    Wrist supination     (Blank rows = not tested)  UPPER EXTREMITY MMT:     MMT Right eval Left eval  Shoulder flexion 4-/5 3+/5  Shoulder abduction    Shoulder adduction    Shoulder extension    Shoulder internal rotation    Shoulder external rotation    Middle trapezius    Lower trapezius    Elbow flexion 4+/5 4+/5  Elbow extension 4/5 4/5  Wrist flexion    Wrist extension    Wrist ulnar deviation    Wrist radial deviation    Wrist pronation  Wrist supination    (Blank rows = not tested)  HAND FUNCTION: Grip strength: Right: 46.9 lbs; Left: 40.3 lbs  COORDINATION: 9 Hole Peg test: Right: 44 sec; Left: 40 sec   SENSATION: Light touch: Impaired  for ring and small finger R hand ulnar distribution, reports this was since CA treatment  EDEMA: n/a   COGNITION: Overall cognitive status: Impaired Decreased short term memory, delayed processing and decreased awareness  VISION:  Baseline vision: Wears glasses all the time   VISION ASSESSMENT: Number cancellation task 1.5 M : 7/80 missed, 91% Visual Fields: per hospital records R homonymous hemianopsia  Vision to be further assessed in a functional context  Patient has difficulty with following activities due to following visual impairments: ambulates close to walls and doorways on R side   OBSERVATIONS: Pt with minimal verbalizations and poor awareness of deficits   TODAY'S TREATMENT:      06/29/22 -  BP at beginning of O.T. session (immediately following P.T. session) = 157/92  Pt copied small peg design with RUE for increased fine motor coordination and visual perceptual skills, increased time and mod difficulty and verbal cues for design  Standing to place/ remove graded clothespins with RUE for sustained pinch and functional reach, yellow- blue, pt attempted Black clothespins however discontinued due to max difficulty.   PATIENT EDUCATION:see above   HOME EXERCISE PROGRAM: 05/30/22: Visual scanning strategies,  coordination HEP 06/01/22: cane HEP   06/06/22: memory strategies 06/20/22: Red theraband HEP   GOALS: Goals reviewed with patient? Yes  SHORT TERM GOALS: Target date: 06/02/22  I with initial HEP  Goal status: MET w/ cues  2.  Pt will perform tub transfers safely mod I using tub bench prn. Baseline: doe not have tub bench yet, currently supervision, min A Goal status: MET  3.  Pt/caregiver will verbalize understanding of compensations for visual deficits  Goal status: MET  4.  Pt will demonstrate improved fine motor coordination  for ADLs with RUE as evidenced by decreasing 9 hole peg test score by 3 secs Baseline: 9 Hole Peg test: Right: 44 sec; Left: 40 sec Goal status: IN PROGRESS (06/22/22 = 42 sec)   5.   Pt will navigate a busy environment and locate items with 75% accuracy without bumping into items. Baseline: nearly bumps into wall and doorway on right side Goal status: IN PROGRESS (56% accuracy)   6.  Pt will verbalize understanding of compensations for short term memory deficits. Baseline:  Goal status: PARTIALLY MET - issued and reviewed x2, however pt still has a hard time recalling strategies   LONG TERM GOALS: Target date: 07/25/21  I with updated HEP Baseline:  Goal status: INITIAL  2.  Pt will navigate a busy environment and locate items with 90% accuracy. Baseline: nearly bumps into wall and doorway on right side Goal status: INITIAL  3.  Pt will perform all basic ADLs mod I with good safety awareness. Baseline: supervision, min A Goal status: met mod I  4.  Pt will demonstrate improved fine motor coordination with RUE  for ADLs as evidenced by decreasing 9 hole peg test score by 6 secs form initial eval. Baseline: 9 Hole Peg test: Right: 44 sec; Left: 40 sec Goal status: INITIAL  5.  Pt will demonstrate ability to retrieve lightweight object at 110* shoulder flexion and -10 elbow extension with RUE Baseline: shoulder flexion 105, elbow extension  -20 Goal status: INITIAL  6.  Pt will demonstrate ability to retrieve lightweight  object at 110* shoulder flexion and -15 elbow extension with LUE Baseline: shoulder flexion 105, elbow extension -20 Goal status: inital  ASSESSMENT:  CLINICAL IMPRESSION: Pt progressing towards goals. Pt  continues to limited by cognitive and visual/perceptual deficits.  Pt required significantly increased time to complete a small peg design today.  PERFORMANCE DEFICITS: in functional skills including ADLs, IADLs, coordination, dexterity, proprioception, sensation, tone, ROM, strength, flexibility, Fine motor control, Gross motor control, mobility, balance, endurance, decreased knowledge of precautions, decreased knowledge of use of DME, vision, and UE functional use, cognitive skills including energy/drive, learn, memory, problem solving, safety awareness, thought, and understand, and psychosocial skills including coping strategies, environmental adaptation, habits, interpersonal interactions, and routines and behaviors.   IMPAIRMENTS: are limiting patient from ADLs, IADLs, rest and sleep, play, leisure, and social participation.   CO-MORBIDITIES: may have co-morbidities  that affects occupational performance. Patient will benefit from skilled OT to address above impairments and improve overall function.  MODIFICATION OR ASSISTANCE TO COMPLETE EVALUATION: No modification of tasks or assist necessary to complete an evaluation.  OT OCCUPATIONAL PROFILE AND HISTORY: Detailed assessment: Review of records and additional review of physical, cognitive, psychosocial history related to current functional performance.  CLINICAL DECISION MAKING: LOW - limited treatment options, no task modification necessary  REHAB POTENTIAL: Good  EVALUATION COMPLEXITY: Low   PLAN:  OT FREQUENCY: 2x/week  OT DURATION: 12 weeks  PLANNED INTERVENTIONS: self care/ADL training, therapeutic exercise, therapeutic activity,  neuromuscular re-education, manual therapy, passive range of motion, gait training, balance training, stair training, functional mobility training, aquatic therapy, splinting, paraffin, fluidotherapy, moist heat, cryotherapy, contrast bath, patient/family education, cognitive remediation/compensation, visual/perceptual remediation/compensation, energy conservation, coping strategies training, DME and/or AE instructions, and Re-evaluation  RECOMMENDED OTHER SERVICES: PT, ST  CONSULTED AND AGREED WITH PLAN OF CARE: Patient and family member/caregiver  PLAN FOR NEXT SESSION:  check to see if pt. added more visits,  continue to work on attention, visual scanning, coordination   Jerzie Bieri, OTR/L 06/29/2022, 11:07 AM

## 2022-06-29 ENCOUNTER — Encounter: Payer: Self-pay | Admitting: Physical Therapy

## 2022-06-29 ENCOUNTER — Encounter: Payer: Self-pay | Admitting: Occupational Therapy

## 2022-06-29 ENCOUNTER — Ambulatory Visit: Payer: Medicare Other | Admitting: Physical Therapy

## 2022-06-29 ENCOUNTER — Ambulatory Visit: Payer: Medicare Other | Admitting: Speech Pathology

## 2022-06-29 ENCOUNTER — Ambulatory Visit: Payer: Medicare Other | Admitting: Occupational Therapy

## 2022-06-29 VITALS — BP 153/88 | HR 69

## 2022-06-29 VITALS — BP 157/92

## 2022-06-29 DIAGNOSIS — R278 Other lack of coordination: Secondary | ICD-10-CM

## 2022-06-29 DIAGNOSIS — R41842 Visuospatial deficit: Secondary | ICD-10-CM

## 2022-06-29 DIAGNOSIS — M6281 Muscle weakness (generalized): Secondary | ICD-10-CM

## 2022-06-29 DIAGNOSIS — R2689 Other abnormalities of gait and mobility: Secondary | ICD-10-CM | POA: Diagnosis not present

## 2022-06-29 DIAGNOSIS — R41841 Cognitive communication deficit: Secondary | ICD-10-CM

## 2022-06-29 DIAGNOSIS — R2681 Unsteadiness on feet: Secondary | ICD-10-CM | POA: Diagnosis not present

## 2022-06-29 DIAGNOSIS — R41840 Attention and concentration deficit: Secondary | ICD-10-CM

## 2022-06-29 NOTE — Patient Instructions (Signed)
Access Code: [email or web address] URL: [email or web address] Date: 06/29/2022 Prepared by: Elease Etienne  Exercises - Corner Balance Feet Together With Eyes Closed  - 1 x daily - 5 x weekly - 1 sets - 2-3 reps - 30-45 seconds hold - Supine Bridge  - 1 x daily - 4-5 x weekly - 2 sets - 10 reps - Sit to Stand with Arms Crossed  - 1 x daily - 5 x weekly - 1-2 sets - 8 reps - Standing Hip Extension with Leg Bent and Support  - 1 x daily - 4-5 x weekly - 1 sets - 10 reps

## 2022-06-29 NOTE — Therapy (Signed)
OUTPATIENT SPEECH LANGUAGE PATHOLOGY TREATMENT   Patient Name: Antonio Adams MRN: [id number] DOB:08-03-1945, 77 y.o., male Today's Date: 06/29/2022  PCP: [name]., MD REFERRING PROVIDER: Hoyt Koch., MD  END OF SESSION:  End of Session - 06/29/22 0942     Visit Number 9    Number of Visits 25    Date for SLP Re-Evaluation 07/26/22    Authorization Type [employer] Medicare    SLP Start Time 0940   Pt running late   SLP Stop Time  1015    SLP Time Calculation (min) 35 min    Activity Tolerance Patient tolerated treatment well               Past Medical History:  Diagnosis Date   ADENOCARCINOMA, PROSTATE, GLEASON GRADE 6 12/21/2009   prostate cancer   ALLERGIC RHINITIS 03/16/2007   Allergy    Anxiety    Arthritis    back    BACK PAIN WITH RADICULOPATHY 11/23/2008   Cancer of kidney ([place])    partial right kidney removed   CARPAL TUNNEL SYNDROME, BILATERAL 03/16/2007   Chronic back pain    Constipation    takes Senokot daily   Depression    occasionally   DIABETES MELLITUS, TYPE II 03/16/2007   takes Januvia and MEtformin daily   GASTROINTESTINAL HEMORRHAGE, HX OF 03/16/2007   GERD 03/16/2007   takes Omeprazole daily   Glaucoma    mild - no eye drops   Hemorrhoids    History of colon polyps    HYPERLIPIDEMIA 03/16/2007   takes Crestor daily   HYPERTENSION 03/16/2007   takes Diltiazem and Lisinopril daily    Kidney cancer, primary, with metastasis from kidney to other site [facility])    LEG CRAMPS 05/22/2007   Nocturia    Overactive bladder    PARESTHESIA 05/21/2007   Proctitis    PTSD (post-traumatic stress disorder)    wakes up may be dreaming of fighting   Rectal bleeding    Dr.Norins has explained its from the Radiation that he has received   Rectal bleeding    Renal cell carcinoma ([place])    Sleep apnea    uses CPAP nightly   Stomach cancer ([place])    Urinary frequency    takes Toviaz daily   Past Surgical History:  Procedure Laterality Date    ADRENALECTOMY Right 02/26/2020   Procedure: OPEN RIGHT RENAL CELL METASTATECTOMY AND  ADRENALECTOMY;  Surgeon: [name], MD;  Location: [facility];  Service: Urology;  Laterality: Right;   [place], 2008   repeat surgery; ESI '08   [place]     bilateral   CERVICAL FUSION     COLONOSCOPY     colonosocpy     ESOPHAGOGASTRODUODENOSCOPY     ED   FLEXIBLE SIGMOIDOSCOPY N/A 01/08/2017   Procedure: FLEXIBLE SIGMOIDOSCOPY;  Surgeon: Irene Shipper, MD;  Location: [place];  Service: Endoscopy;  Laterality: N/A;   HOT HEMOSTASIS N/A 01/08/2017   Procedure: HOT HEMOSTASIS (ARGON PLASMA COAGULATION/BICAP);  Surgeon: Irene Shipper, MD;  [facility];  Service: Endoscopy;  Laterality: N/A;   IR RADIOLOGIST EVAL & MGMT  12/17/2019   IR RADIOLOGIST EVAL & MGMT  07/01/2020   IR RADIOLOGIST EVAL & MGMT  08/25/2020   LUMBAR LAMINECTOMY  10/30/2011   Procedure: MICRODISCECTOMY LUMBAR LAMINECTOMY;  Surgeon: Jessy Oto, MD;  Location: [place];  Service: Orthopedics;  Laterality: Right;  Right L4-5 and L5-S1 Microdiscectomy  LUMBAR LAMINECTOMY  06/17/2012   Procedure: MICRODISCECTOMY LUMBAR LAMINECTOMY;  Surgeon: Jessy Oto, MD;  Location: [place];  Service: Orthopedics;  Laterality: N/A;  Right L5-S1 microdiscectomy   OPEN PARTIAL HEPATECTOMY  N/A 02/26/2020   Procedure: OPEN PARTIAL HEPATECTOMY, CHOLECYSTECTOMY;  Surgeon: Stark Klein, MD;  Location: [facility];  Service: General;  Laterality: N/A;   POLYPECTOMY     PROSTATE SURGERY  march 2012   seed implant   RADIOFREQUENCY ABLATION N/A 07/21/2020   Procedure: CT MICROWAVE ABLATION;  Surgeon: Criselda Peaches, MD;  Location: [facility];  Service: Anesthesiology;  Laterality: N/A;   ROBOTIC ASSITED PARTIAL NEPHRECTOMY Right 09/08/2015   Procedure: XI ROBOTIC ASSITED RIGHT PARTIAL NEPHRECTOMY;  Surgeon: [name], MD;  Location: [facility];  Service: Urology;  Laterality: Right;  Clamp on: 1033 Clamp off: 1103 Total  Clamp Time: 30 minutes   SHOULDER ARTHROSCOPY WITH SUBACROMIAL DECOMPRESSION Left 01/20/2016   Procedure: LEFT SHOULDER ARTHROSCOPY WITH EXTENSIVE  DEBRIDEMENT, ACROMIOPLASTY;  Surgeon: Ninetta Lights, MD;  Location: [place];  Service: Orthopedics;  Laterality: Left;   SHOULDER SURGERY  LFT   Stress Cardiolite  12/24/2003   negative for ischemia   UPPER ESOPHAGEAL ENDOSCOPIC ULTRASOUND (EUS)  04/18/2016   [facility]   UPPER GASTROINTESTINAL ENDOSCOPY     WOUND EXPLORATION     management of a wound that he sustained in the [place]   Patient Active Problem List   Diagnosis Date Noted   Vaccine for VZV (varicella-zoster virus) 06/12/2022   Hx of completed stroke 04/28/2022   Abnormal brain CT 04/07/2022   Encounter for general adult medical examination with abnormal findings 01/23/2022   Anemia due to zinc deficiency 12/20/2021   Low back pain 12/15/2021   Symptomatic anemia 12/15/2021   Cancer related pain 12/15/2021   Chronic heart failure with preserved ejection fraction (HFpEF) ([place]) 08/12/2021   Tinea cruris 08/12/2021   Anemia due to chronic kidney disease 07/20/2021   CKD (chronic kidney disease) stage 5, GFR less than 15 ml/min (HCC) 05/24/2021   BPH (benign prostatic hyperplasia) 05/24/2021   Diabetic renal disease ([place]) 05/20/2021   Dry eye syndrome of bilateral lacrimal glands 05/20/2021   Senile nuclear sclerosis 05/20/2021   Vitamin D deficiency 05/20/2021   Vitreous degeneration, unspecified eye 05/20/2021   Intrinsic eczema 04/18/2021   Atherosclerosis of aorta ([place]) 12/30/2020   Mixed conductive and sensorineural hearing loss of left ear with restricted hearing of right ear 09/01/2020   Tinnitus of both ears 05/20/2020   Metastatic renal cell carcinoma ([place]) 02/26/2020   Diabetic polyneuropathy associated with type 2 diabetes mellitus ([place]) 12/18/2019   Murmur, cardiac 10/08/2018   DDD (degenerative disc disease), cervical 10/08/2018   OAB  (overactive bladder) 04/04/2018   Seasonal allergic rhinitis due to pollen 11/01/2016   Microalbuminuria due to type 2 diabetes mellitus ([place]) 07/27/2016   Varicose veins of both lower extremities 08/06/2015   Varicose veins of lower extremities with ulcer ([place]) 06/16/2015   Internal hemorrhoid, bleeding 08/03/2014   Post traumatic stress disorder (PTSD) 01/29/2014   Spinal stenosis of lumbar region with radiculopathy 10/30/2011    Class: Chronic   Obstructive sleep apnea 11/24/2010   Obesity 11/24/2010   Routine general medical examination at a health care facility 11/24/2010   ADENOCARCINOMA, PROSTATE, GLEASON GRADE 6 12/21/2009   Essential hypertension 03/16/2007   GERD 03/16/2007    ONSET DATE: 04-07-22   REFERRING DIAG: [id number] (ICD-10-CM) - Personal history of transient ischemic attack (  TIA), and cerebral infarction without residual deficits   THERAPY DIAG:  Cognitive communication deficit  Rationale for Evaluation and Treatment: Rehabilitation  SUBJECTIVE:   SUBJECTIVE STATEMENT: "We're good"  PAIN: Are you having pain? No  OBJECTIVE:    TODAY'S TREATMENT:   06-29-22: Pt accompanied by wife. Running 10 minutes late. Wife endorsed that they have purchased a digital clock that provides both date and time. Pt has had difficulty remembering to read the clock (scanning), which he is working on in [place]. SLP provided education concerning attention difficulties and how wife can support pt by prompting him to scan. SLP led pt in exercise scanning to the right (weakest area) provided faded verbal prompts to mod I. Instructed pt and wife to practice scanning at clock to read the clock with faded support to increase independence. Pt able to use attention strategies in 3/3 trials resulting in 100% accuracy in ID objects in R visual field.  Pt's sleep has improved since purchasing a timer for his lamp. He is no longer sleeping with the lamp on. Wife endorsed that the external memory aid  the clinician provided for medication has been effective.    06-22-22: Pt running 15 minutes late, accompanied by spouse. Wife endorsed that pt still has difficulty remembering to take meds sometimes. SLP re-educated pt and spouse on importance of routine to aid memory. Suggested keeping medication in the same place instead of moving them between two different rooms. Plan is to keep medications in kitchen, but place the meds he takes at night in a separate container in the bedroom. SLP provided signage as external memory aid for pt to hang in kitchen as reminder to take medications in morning. Reminded pt to keep sign in place where it will catch his attention and to move it where it will be most helpful.  Pt reported that he is good with paying bills, calculating tips at restaurants, and managing money overall. Spouse expressed concern for slow processing with math calculations. SLP facilitated discussion on benefits of taking time and checking calculations to ensure accuracy.   06-20-22: Pt accompanied by wife. Pt reported difficulty sleeping over the past few nights d/t forgetting to turn light off. SLP suggested purchasing a timer to turn light off at a certain time, to which pt was receptive. Gathered resources on where to find item and also recommended schedule management strategies, such as purchasing a digital clock that shows the date. SLP provided re-education concerning importance of maintaining a consistent sleep schedule for overall health and cognition.  Pt stated "I think everything is going pretty well." Pt reported bill paying is going well. Pt continues to demonstrate decreased motivation and awareness of deficits. Requires encouragement (frequent verbal cues) to participate in therapeutic tasks and provide sufficient detail in responses.Pt reported that grocery shopping is going well; he has been remembering what he needs to buy.  Wife endorsed that she has "seen improvement". Clinician  facilitated checkbook balancing activity, providing consistent verbal and visual cues to guide pt through process. Pt with breakdowns in knowing where to place values and complete computations to balance. Verbal and tactile cues required for using calculator to aid in calculations.  HEP: Use the ATM and go through the process of taking out money to practice the steps/sequence.   06-15-22: Reports improved sleeping habits secondary to recommendations from primary SLP, which has improved cognitive functioning during the day. Good carryover of medication recommendations reported. Pt provided pill box as example with AM medication taken. Discussed  cognitive strategies for bill management, including writing date of calendar when bill arrives. Pt and fiance verbalized agreement. Suspects reduced intellectual and emergent awareness, as pt reported some simple mistakes during usual routine. Pt often attempted to explain away deficits when possible deficit identified. Recommended extra processing time prior to initiating task ("stop and think") and double checking during/after task to ensure accurate completion. See patient instructions with recs.   PATIENT EDUCATION: Education details: see above Person educated: Patient and Spouse Education method: Explanation, Demonstration, and Verbal cues Education comprehension: verbalized understanding, returned demonstration, and needs further education   GOALS: Goals reviewed with patient? Yes  SHORT TERM GOALS: Target date: 05-31-22 (addressed today)  Pt will complete CLQT and PROM initial therapy session Baseline: Goal status: MET  2.  Pt will accurately sort medications with use of compensations PRN into pill box with occasional mod-A Baseline:  Goal status: MET  3.  Pt will generate visual aid to assist with increased independence in paying bills with A from SLP and s/o Baseline:  Goal status: PARTIALLY MET  4.  Pt will verbalize appropriate memory  strategy given functional scenario with 80% accuracy   Baseline:  Goal status: NOT MET  5.  Pt and s/o will implement external A for orientation (e.g. calendar) with usual cues from care partner to initiate use  Baseline:  Goal status: PARTIALLY MET   LONG TERM GOALS: Target date: 07-26-22  With use of external aid, pt will answer orientation questions with 100% accuracy over 2 sessions Baseline:  Goal status: IN PROGRESS  2.  Pt will verbalize x3 situations in which he successfully employed a memory strategy resulting in subjective satisfaction with ability to recall desired information Baseline:  Goal status: IN PROGRESS  3.  Pt will pay x3 of own bills with supervision from s/o, with use of compensations PRN Baseline:  Goal status: IN PROGRESS  4.  Pt will report improvement via PROM by dc Baseline:  Goal status: IN PROGRESS  ASSESSMENT:  CLINICAL IMPRESSION: Pt reports that he was successful navigating ATM to take out money and has purchased a timer for his light, which will help his sleep routine. Spouse expresses concern for pt taking medication consistently, particularly in morning. SLP provided external visual aids for pt and re-educated pt and wife concerning importance of establishing routine when possible.Pt continues to benefit from skilled ST services for cognition.   OBJECTIVE IMPAIRMENTS: include attention, memory, awareness, executive functioning, and expressive language. These impairments are limiting patient from managing medications, managing appointments, managing finances, household responsibilities, and ADLs/IADLs. Factors affecting potential to achieve goals and functional outcome are ability to learn/carryover information and cooperation/participation level.. Patient will benefit from skilled SLP services to address above impairments and improve overall function.  REHAB POTENTIAL: Fair (carryover and participation)  PLAN:  SLP FREQUENCY: 2x/week  SLP  DURATION: 12 weeks  PLANNED INTERVENTIONS: Cueing hierachy, Cognitive reorganization, Internal/external aids, Functional tasks, SLP instruction and feedback, Compensatory strategies, and Patient/family education    Leroy Libman, [place] 06/29/2022, 9:43 AM

## 2022-06-29 NOTE — Therapy (Signed)
OUTPATIENT PHYSICAL THERAPY NEURO TREATMENT   Patient Name: Antonio Adams MRN: [id number] DOB:[birth year], 77 y.o., male Today's Date: 06/29/2022   PCP: Scarlette Calico, MD REFERRING PROVIDER: Pricilla Holm, MD  END OF SESSION:  PT End of Session - 06/29/22 1023     Visit Number 8    Number of Visits 17    Date for PT Re-Evaluation 07/22/22   updated POC   Authorization Type [employer]    Progress Note Due on Visit 10    PT Start Time 1020   PT ran over w/ pt prior.   PT Stop Time 1059    PT Time Calculation (min) 39 min    Equipment Utilized During Treatment Gait belt    Activity Tolerance Patient tolerated treatment well    Behavior During Therapy WFL for tasks assessed/performed             Past Medical History:  Diagnosis Date   ADENOCARCINOMA, PROSTATE, GLEASON GRADE 6 12/21/2009   prostate cancer   ALLERGIC RHINITIS 03/16/2007   Allergy    Anxiety    Arthritis    back    BACK PAIN WITH RADICULOPATHY 11/23/2008   Cancer of kidney ([place])    partial right kidney removed   CARPAL TUNNEL SYNDROME, BILATERAL 03/16/2007   Chronic back pain    Constipation    takes Senokot daily   Depression    occasionally   DIABETES MELLITUS, TYPE II 03/16/2007   takes Januvia and MEtformin daily   GASTROINTESTINAL HEMORRHAGE, HX OF 03/16/2007   GERD 03/16/2007   takes Omeprazole daily   Glaucoma    mild - no eye drops   Hemorrhoids    History of colon polyps    HYPERLIPIDEMIA 03/16/2007   takes Crestor daily   HYPERTENSION 03/16/2007   takes Diltiazem and Lisinopril daily    Kidney cancer, primary, with metastasis from kidney to other site [facility])    LEG CRAMPS 05/22/2007   Nocturia    Overactive bladder    PARESTHESIA 05/21/2007   Proctitis    PTSD (post-traumatic stress disorder)    wakes up may be dreaming of fighting   Rectal bleeding    Dr.Norins has explained its from the Radiation that he has received   Rectal bleeding    Renal cell carcinoma ([place])    Sleep  apnea    uses CPAP nightly   Stomach cancer ([place])    Urinary frequency    takes Toviaz daily   Past Surgical History:  Procedure Laterality Date   ADRENALECTOMY Right 02/26/2020   Procedure: OPEN RIGHT RENAL CELL METASTATECTOMY AND  ADRENALECTOMY;  Surgeon: [name], MD;  Location: [facility];  Service: Urology;  Laterality: Right;   [place], 2008   repeat surgery; ESI '08   [place]     bilateral   CERVICAL FUSION     COLONOSCOPY     colonosocpy     ESOPHAGOGASTRODUODENOSCOPY     ED   FLEXIBLE SIGMOIDOSCOPY N/A 01/08/2017   Procedure: FLEXIBLE SIGMOIDOSCOPY;  Surgeon: Irene Shipper, MD;  Location: [place];  Service: Endoscopy;  Laterality: N/A;   HOT HEMOSTASIS N/A 01/08/2017   Procedure: HOT HEMOSTASIS (ARGON PLASMA COAGULATION/BICAP);  Surgeon: Irene Shipper, MD;  Location: [facility] ENDOSCOPY;  Service: Endoscopy;  Laterality: N/A;   IR RADIOLOGIST EVAL & MGMT  12/17/2019   IR RADIOLOGIST EVAL & MGMT  07/01/2020   IR RADIOLOGIST EVAL & MGMT  08/25/2020   LUMBAR  LAMINECTOMY  10/30/2011   Procedure: MICRODISCECTOMY LUMBAR LAMINECTOMY;  Surgeon: Jessy Oto, MD;  Location: [place];  Service: Orthopedics;  Laterality: Right;  Right L4-5 and L5-S1 Microdiscectomy   LUMBAR LAMINECTOMY  06/17/2012   Procedure: MICRODISCECTOMY LUMBAR LAMINECTOMY;  Surgeon: Jessy Oto, MD;  Location: [place];  Service: Orthopedics;  Laterality: N/A;  Right L5-S1 microdiscectomy   OPEN PARTIAL HEPATECTOMY  N/A 02/26/2020   Procedure: OPEN PARTIAL HEPATECTOMY, CHOLECYSTECTOMY;  Surgeon: Stark Klein, MD;  Location: [facility];  Service: General;  Laterality: N/A;   POLYPECTOMY     PROSTATE SURGERY  march 2012   seed implant   RADIOFREQUENCY ABLATION N/A 07/21/2020   Procedure: CT MICROWAVE ABLATION;  Surgeon: Criselda Peaches, MD;  Location: [facility];  Service: Anesthesiology;  Laterality: N/A;   ROBOTIC ASSITED PARTIAL NEPHRECTOMY Right 09/08/2015   Procedure: XI ROBOTIC ASSITED RIGHT  PARTIAL NEPHRECTOMY;  Surgeon: [name], MD;  [facility];  Service: Urology;  Laterality: Right;  Clamp on: 1033 Clamp off: 1103 Total Clamp Time: 30 minutes   SHOULDER ARTHROSCOPY WITH SUBACROMIAL DECOMPRESSION Left 01/20/2016   Procedure: LEFT SHOULDER ARTHROSCOPY WITH EXTENSIVE  DEBRIDEMENT, ACROMIOPLASTY;  Surgeon: Ninetta Lights, MD;  Location: [place];  Service: Orthopedics;  Laterality: Left;   SHOULDER SURGERY  LFT   Stress Cardiolite  12/24/2003   negative for ischemia   UPPER ESOPHAGEAL ENDOSCOPIC ULTRASOUND (EUS)  04/18/2016   [facility]   UPPER GASTROINTESTINAL ENDOSCOPY     WOUND EXPLORATION     management of a wound that he sustained in the [place]   Patient Active Problem List   Diagnosis Date Noted   Vaccine for VZV (varicella-zoster virus) 06/12/2022   Hx of completed stroke 04/28/2022   Abnormal brain CT 04/07/2022   Encounter for general adult medical examination with abnormal findings 01/23/2022   Anemia due to zinc deficiency 12/20/2021   Low back pain 12/15/2021   Symptomatic anemia 12/15/2021   Cancer related pain 12/15/2021   Chronic heart failure with preserved ejection fraction (HFpEF) ([place]) 08/12/2021   Tinea cruris 08/12/2021   Anemia due to chronic kidney disease 07/20/2021   CKD (chronic kidney disease) stage 5, GFR less than 15 ml/min (HCC) 05/24/2021   BPH (benign prostatic hyperplasia) 05/24/2021   Diabetic renal disease ([place]) 05/20/2021   Dry eye syndrome of bilateral lacrimal glands 05/20/2021   Senile nuclear sclerosis 05/20/2021   Vitamin D deficiency 05/20/2021   Vitreous degeneration, unspecified eye 05/20/2021   Intrinsic eczema 04/18/2021   Atherosclerosis of aorta ([place]) 12/30/2020   Mixed conductive and sensorineural hearing loss of left ear with restricted hearing of right ear 09/01/2020   Tinnitus of both ears 05/20/2020   Metastatic renal cell carcinoma ([place]) 02/26/2020   Diabetic polyneuropathy  associated with type 2 diabetes mellitus ([place]) 12/18/2019   Murmur, cardiac 10/08/2018   DDD (degenerative disc disease), cervical 10/08/2018   OAB (overactive bladder) 04/04/2018   Seasonal allergic rhinitis due to pollen 11/01/2016   Microalbuminuria due to type 2 diabetes mellitus ([place]) 07/27/2016   Varicose veins of both lower extremities 08/06/2015   Varicose veins of lower extremities with ulcer ([place]) 06/16/2015   Internal hemorrhoid, bleeding 08/03/2014   Post traumatic stress disorder (PTSD) 01/29/2014   Spinal stenosis of lumbar region with radiculopathy 10/30/2011    Class: Chronic   Obstructive sleep apnea 11/24/2010   Obesity 11/24/2010   Routine general medical examination at a health care facility 11/24/2010   ADENOCARCINOMA,  PROSTATE, GLEASON GRADE 6 12/21/2009   Essential hypertension 03/16/2007   GERD 03/16/2007    ONSET DATE: 04/07/22  REFERRING DIAG: Z86.73 (ICD-10-CM) - Hx of completed stroke  THERAPY DIAG:  Muscle weakness (generalized)  Other lack of coordination  Unsteadiness on feet  Other abnormalities of gait and mobility  Rationale for Evaluation and Treatment: Rehabilitation  SUBJECTIVE:                                                                                                                                                                                             SUBJECTIVE STATEMENT: He states he is well today.  He denies falls or pain. Pt accompanied by: significant other [place])  PERTINENT HISTORY: MAKARIOS VLIET is a 77 y.o. R-handed male with history of DM2 with diabetic polyneuropathy, OSA on nightly CPAP, PTSD, history of lumbar stenosis with chronic back pain, metastatic renal carcinoma with lymphadenopathy and questionable hepatic involvement noted in 2022, not on chemo due to poor tolerance to the medications, and without significant progression since May 2023, only on surveillance imaging, stage IV, eczema, vitamin D  deficiency, CKD stage V refusing HD, BPH, anemia of CKD  Zinc deficiency, chronic heart failure, mild cognitive impairment, presenting for evaluation of left PCA territory acute CVA.  PAIN:  Are you having pain? No  PRECAUTIONS: Fall  WEIGHT BEARING RESTRICTIONS: No  OBJECTIVE:  TODAY'S TREATMENT:     Vitals (LUE in sitting prior to activity):   Today's Vitals   06/29/22 1025  BP: (!) 153/88  Pulse: 69   Reviewed and tapered HEP for compliance: -Bridging x10, pt would like to keep these as they feel challenging and he has been doing these some -STS 2x8, first set w/ hands-on-knees, second set w/ arms across chest (most challenging), discussed increasing forward scoot -Standing unsupported march, decreased ROM on RLE, discussed higher march and holds for added challenge -SLS w/ BUE support x30 sec each LE, this is easy for patient, removed from HEP, cued to maintain space between LE to engage glutes -Bent knee kickbacks x10 each LE, cued to maintain hamstring activation w/o forward trunk lean (able to correct w/ cues), remains challenging for patient -Standing tandem w/ BUE support x30 sec each LE in rear, easy for patient, no sway noted, removed from HEP -Side-stepping w/ green theraband at thighs 2x10' each direction, moderate cues to maintain corrected foot posture, step size and eccentric step control, removed from HEP -Feet together eyes closed in corner w/ fingertip support progressed to none w/ significant sway noted 2x30 seconds -SciFit x77mnutes tapering down from level 3.0 to level 1.0 over 6  minutes using BUE/BLE for dynamic cooldown and cardiovascular endurance.  Cued for increased amplitude of movement and decreased pace to further meet endurance goals.  RPE of 5/10 following task.  PATIENT EDUCATION: Education details: Importance of compliance of HEP-modifications made today. Person educated: Patient and Spouse Education method: Explanation, Demonstration, and Verbal  cues Education comprehension: verbalized understanding and needs further education  HOME EXERCISE PROGRAM:  Access Code: [email or web address] URL: [email or web address] Date: 06/29/2022 Prepared by: Elease Etienne  Exercises - Corner Balance Feet Together With Eyes Closed  - 1 x daily - 5 x weekly - 1 sets - 2-3 reps - 30-45 seconds hold - Supine Bridge  - 1 x daily - 4-5 x weekly - 2 sets - 10 reps - Sit to Stand with Arms Crossed  - 1 x daily - 5 x weekly - 1-2 sets - 8 reps - Standing Hip Extension with Leg Bent and Support  - 1 x daily - 4-5 x weekly - 1 sets - 10 reps  GOALS: Goals reviewed with patient? Yes  SHORT TERM GOALS: Target date: 05/26/22  Pt will be IND with initial HEP in order to indicate improved functional mobility and dec fall risk. Baseline:  Pt states compliance and no issues (1/18) Goal status: MET  2.  Pt will improve gait speed to >/=2.62 ft/sec w/ SPC in order to indicate dec fall risk.  Baseline: 2.00 ft/sec; 1.90 ft/sec (1/18) Goal status: NOT MET  3.  Pt will ambulate 56' without at mod I level with only minimal Trendelenburg in order to indicate more independent household ambulation.   Baseline: ongoing compensated right trendelenburg x200' SBA (1/18) Goal status: IN PROGRESS  4.  Pt will improve FGA to >/=18/30 in order to indicate dec fall risk.   Baseline: 12/30; 11/30 (1/23) Goal status: NOT MET  5.  Pt will improve 5TSS to </=30 secs without UE support only in order to indicate dec fall risk and improved functional strength.   Baseline: approx 37 secs with hands on lap and mild anterior LOB; 26.59 sec no UE support or LOB (1/18) Goal status: MET  6.  Pt will negotiate up/down 4 steps with single rail/cane, up/down ramp and curb and ambulate x 500' outdoors over unlevel paved surfaces at S level in order to indicate improved community mobility.   Baseline: 4 stairs right rail, curb and ramp SBA (1/18); pt declines to ambulate  outside due to cold weather (1/23) Goal status: IN PROGRESS  UPDATED LONG TERM GOALS: Target date: 07/22/22  Pt will be IND with final HEP in order to indicate improved functional mobility and dec fall risk. Baseline:  Goal status: ONGOING  2.  Pt will improve FGA to >/=23/30 in order to indicate dec fall risk.   Baseline: 13/30 06/22/22 Goal status: ONGOING  3.  Pt will improve 5TSS to </=20 secs without UE support only in order to indicate dec fall risk and improved functional strength.   Baseline: 27.22 with hands on lap then use of arm rests Goal status: ONGOING  4.  Pt will improve gait speed to >/=2.62 ft/sec w/o AD in order to indicate dec fall risk.  Baseline: 2.38 ft/sec 06/22/22 Goal status: ONGOING   5.  Pt will negotiate up/down 4 steps with single rail, up/down ramp and curb and ambulate x 500' outdoors over unlevel paved surfaces without device at S level in order to indicate improved community mobility.   Baseline:  Goal status: ONGOING      ASSESSMENT:  CLINICAL IMPRESSION: Emphasis of skilled PT session to day on addressing compliance to HEP via tailored exercise review.  Pt responds well to all interventions and remains most challenged by vestibular-related balance task w/o visual reliance.  He would benefit from further balance work related to this.  Will plan to continue addressing deficits as outlined in ongoing skilled PT POC.  OBJECTIVE IMPAIRMENTS: Abnormal gait, decreased activity tolerance, decreased balance, decreased endurance, decreased mobility, decreased strength, impaired perceived functional ability, and postural dysfunction.   ACTIVITY LIMITATIONS: carrying, lifting, bending, standing, squatting, stairs, transfers, and locomotion level  PARTICIPATION LIMITATIONS: meal prep, cleaning, medication management, personal finances, driving, and community activity  PERSONAL FACTORS: 3+ comorbidities: see above  are also affecting patient's functional  outcome.   REHAB POTENTIAL: Good  CLINICAL DECISION MAKING: Evolving/moderate complexity  EVALUATION COMPLEXITY: Moderate  PLAN:  PT FREQUENCY: 2x/week  PT DURATION: 4 weeks  PLANNED INTERVENTIONS: Therapeutic exercises, Therapeutic activity, Neuromuscular re-education, Balance training, Gait training, Patient/Family education, Self Care, Stair training, Vestibular training, DME instructions, and Aquatic Therapy  PLAN FOR NEXT SESSION: gait with and without cane, dynamic stability, hip extension, narrowed BOS, compliant surfaces, hamstring strength, could try blaze pods   Elease Etienne, PT, DPT [facility] [address] [place], [place], [address] Phone: [phone]   Fax:  [phone] 06/29/22, 11:33 AM

## 2022-06-29 NOTE — Patient Instructions (Signed)
Practice scanning to read clock (building muscle memory):  Ask Oberyn to tell you the time Keep doubling length of time between asking Zadin to read the clock (1 min, 2 min, 4 min, 8 min) If he requires direction, reduce time back to the length at which he was able to read the clock independently The more you practice, the easier it becomes  Keep a log of "hiccups" over the next week

## 2022-07-04 ENCOUNTER — Ambulatory Visit: Payer: Medicare Other | Admitting: Speech Pathology

## 2022-07-04 ENCOUNTER — Encounter: Payer: Self-pay | Admitting: Physical Therapy

## 2022-07-04 ENCOUNTER — Ambulatory Visit: Payer: Medicare Other | Admitting: Occupational Therapy

## 2022-07-04 ENCOUNTER — Ambulatory Visit: Payer: Medicare Other | Admitting: Physical Therapy

## 2022-07-04 VITALS — BP 148/91 | HR 68

## 2022-07-04 DIAGNOSIS — R2689 Other abnormalities of gait and mobility: Secondary | ICD-10-CM

## 2022-07-04 DIAGNOSIS — R2681 Unsteadiness on feet: Secondary | ICD-10-CM

## 2022-07-04 DIAGNOSIS — M6281 Muscle weakness (generalized): Secondary | ICD-10-CM

## 2022-07-04 DIAGNOSIS — R278 Other lack of coordination: Secondary | ICD-10-CM | POA: Diagnosis not present

## 2022-07-04 DIAGNOSIS — R41840 Attention and concentration deficit: Secondary | ICD-10-CM

## 2022-07-04 DIAGNOSIS — R41841 Cognitive communication deficit: Secondary | ICD-10-CM

## 2022-07-04 DIAGNOSIS — R41842 Visuospatial deficit: Secondary | ICD-10-CM

## 2022-07-04 NOTE — Therapy (Signed)
OUTPATIENT SPEECH LANGUAGE PATHOLOGY TREATMENT   Patient Name: Antonio Adams MRN: [id number] DOB:1945/08/15, 77 y.o., male Today's Date: 07/04/2022  PCP: [name]., MD REFERRING PROVIDER: Hoyt Koch., MD  END OF SESSION:  End of Session - 07/04/22 1105     Visit Number 10    Number of Visits 25    Date for SLP Re-Evaluation 07/26/22    Authorization Type [employer] Medicare    SLP Start Time 1104    SLP Stop Time  1131    SLP Time Calculation (min) 27 min    Activity Tolerance Patient tolerated treatment well               Past Medical History:  Diagnosis Date   ADENOCARCINOMA, PROSTATE, GLEASON GRADE 6 12/21/2009   prostate cancer   ALLERGIC RHINITIS 03/16/2007   Allergy    Anxiety    Arthritis    back    BACK PAIN WITH RADICULOPATHY 11/23/2008   Cancer of kidney ([place])    partial right kidney removed   CARPAL TUNNEL SYNDROME, BILATERAL 03/16/2007   Chronic back pain    Constipation    takes Senokot daily   Depression    occasionally   DIABETES MELLITUS, TYPE II 03/16/2007   takes Januvia and MEtformin daily   GASTROINTESTINAL HEMORRHAGE, HX OF 03/16/2007   GERD 03/16/2007   takes Omeprazole daily   Glaucoma    mild - no eye drops   Hemorrhoids    History of colon polyps    HYPERLIPIDEMIA 03/16/2007   takes Crestor daily   HYPERTENSION 03/16/2007   takes Diltiazem and Lisinopril daily    Kidney cancer, primary, with metastasis from kidney to other site [facility])    LEG CRAMPS 05/22/2007   Nocturia    Overactive bladder    PARESTHESIA 05/21/2007   Proctitis    PTSD (post-traumatic stress disorder)    wakes up may be dreaming of fighting   Rectal bleeding    Dr.Norins has explained its from the Radiation that he has received   Rectal bleeding    Renal cell carcinoma ([place])    Sleep apnea    uses CPAP nightly   Stomach cancer ([place])    Urinary frequency    takes Toviaz daily   Past Surgical History:  Procedure Laterality Date   ADRENALECTOMY  Right 02/26/2020   Procedure: OPEN RIGHT RENAL CELL METASTATECTOMY AND  ADRENALECTOMY;  Surgeon: [name], MD;  Location: [facility];  Service: Urology;  Laterality: Right;   [place], 2008   repeat surgery; ESI '08   [place]     bilateral   CERVICAL FUSION     COLONOSCOPY     colonosocpy     ESOPHAGOGASTRODUODENOSCOPY     ED   FLEXIBLE SIGMOIDOSCOPY N/A 01/08/2017   Procedure: FLEXIBLE SIGMOIDOSCOPY;  Surgeon: Irene Shipper, MD;  Location: [place];  Service: Endoscopy;  Laterality: N/A;   HOT HEMOSTASIS N/A 01/08/2017   Procedure: HOT HEMOSTASIS (ARGON PLASMA COAGULATION/BICAP);  Surgeon: Irene Shipper, MD;  Location[facility] ENDOSCOPY;  Service: Endoscopy;  Laterality: N/A;   IR RADIOLOGIST EVAL & MGMT  12/17/2019   IR RADIOLOGIST EVAL & MGMT  07/01/2020   IR RADIOLOGIST EVAL & MGMT  08/25/2020   LUMBAR LAMINECTOMY  10/30/2011   Procedure: MICRODISCECTOMY LUMBAR LAMINECTOMY;  Surgeon: Jessy Oto, MD;  Location: [place];  Service: Orthopedics;  Laterality: Right;  Right L4-5 and L5-S1 Microdiscectomy   LUMBAR LAMINECTOMY  06/17/2012   Procedure: MICRODISCECTOMY LUMBAR LAMINECTOMY;  Surgeon: Jessy Oto, MD;  Location: [place];  Service: Orthopedics;  Laterality: N/A;  Right L5-S1 microdiscectomy   OPEN PARTIAL HEPATECTOMY  N/A 02/26/2020   Procedure: OPEN PARTIAL HEPATECTOMY, CHOLECYSTECTOMY;  Surgeon: Stark Klein, MD;  Location: [facility];  Service: General;  Laterality: N/A;   POLYPECTOMY     PROSTATE SURGERY  march 2012   seed implant   RADIOFREQUENCY ABLATION N/A 07/21/2020   Procedure: CT MICROWAVE ABLATION;  Surgeon: Criselda Peaches, MD;  Location: [facility];  Service: Anesthesiology;  Laterality: N/A;   ROBOTIC ASSITED PARTIAL NEPHRECTOMY Right 09/08/2015   Procedure: XI ROBOTIC ASSITED RIGHT PARTIAL NEPHRECTOMY;  Surgeon: [name], MD;  Location: [facility];  Service: Urology;  Laterality: Right;  Clamp on: 1033 Clamp off: 1103 Total Clamp Time: 30  minutes   SHOULDER ARTHROSCOPY WITH SUBACROMIAL DECOMPRESSION Left 01/20/2016   Procedure: LEFT SHOULDER ARTHROSCOPY WITH EXTENSIVE  DEBRIDEMENT, ACROMIOPLASTY;  Surgeon: Ninetta Lights, MD;  Location: [place];  Service: Orthopedics;  Laterality: Left;   SHOULDER SURGERY  LFT   Stress Cardiolite  12/24/2003   negative for ischemia   UPPER ESOPHAGEAL ENDOSCOPIC ULTRASOUND (EUS)  04/18/2016   [facility]   UPPER GASTROINTESTINAL ENDOSCOPY     WOUND EXPLORATION     management of a wound that he sustained in the [place]   Patient Active Problem List   Diagnosis Date Noted   Vaccine for VZV (varicella-zoster virus) 06/12/2022   Hx of completed stroke 04/28/2022   Abnormal brain CT 04/07/2022   Encounter for general adult medical examination with abnormal findings 01/23/2022   Anemia due to zinc deficiency 12/20/2021   Low back pain 12/15/2021   Symptomatic anemia 12/15/2021   Cancer related pain 12/15/2021   Chronic heart failure with preserved ejection fraction (HFpEF) ([place]) 08/12/2021   Tinea cruris 08/12/2021   Anemia due to chronic kidney disease 07/20/2021   CKD (chronic kidney disease) stage 5, GFR less than 15 ml/min (HCC) 05/24/2021   BPH (benign prostatic hyperplasia) 05/24/2021   Diabetic renal disease ([place]) 05/20/2021   Dry eye syndrome of bilateral lacrimal glands 05/20/2021   Senile nuclear sclerosis 05/20/2021   Vitamin D deficiency 05/20/2021   Vitreous degeneration, unspecified eye 05/20/2021   Intrinsic eczema 04/18/2021   Atherosclerosis of aorta ([place]) 12/30/2020   Mixed conductive and sensorineural hearing loss of left ear with restricted hearing of right ear 09/01/2020   Tinnitus of both ears 05/20/2020   Metastatic renal cell carcinoma ([place]) 02/26/2020   Diabetic polyneuropathy associated with type 2 diabetes mellitus ([place]) 12/18/2019   Murmur, cardiac 10/08/2018   DDD (degenerative disc disease), cervical 10/08/2018   OAB (overactive  bladder) 04/04/2018   Seasonal allergic rhinitis due to pollen 11/01/2016   Microalbuminuria due to type 2 diabetes mellitus ([place]) 07/27/2016   Varicose veins of both lower extremities 08/06/2015   Varicose veins of lower extremities with ulcer ([place]) 06/16/2015   Internal hemorrhoid, bleeding 08/03/2014   Post traumatic stress disorder (PTSD) 01/29/2014   Spinal stenosis of lumbar region with radiculopathy 10/30/2011    Class: Chronic   Obstructive sleep apnea 11/24/2010   Obesity 11/24/2010   Routine general medical examination at a health care facility 11/24/2010   ADENOCARCINOMA, PROSTATE, GLEASON GRADE 6 12/21/2009   Essential hypertension 03/16/2007   GERD 03/16/2007    ONSET DATE: 04-07-22   REFERRING DIAG: [id number] (ICD-10-CM) - Personal history of transient ischemic attack (TIA), and cerebral  infarction without residual deficits   THERAPY DIAG:  Cognitive communication deficit  Rationale for Evaluation and Treatment: Rehabilitation  SUBJECTIVE:   SUBJECTIVE STATEMENT: "I'm getting through the day"  PAIN: Are you having pain? No  OBJECTIVE:    TODAY'S TREATMENT:   07-04-22: Pt accompanied by wife. Wife endorsed that she did not have to write down any of the pt's difficulties over the past week, as he has been doing well and she did not notice any significant cognitive challenges in daily life. SLP facilitated discussion on pt's goals and progress since initial evaluation. Pt reported that he believes his current cognitive status approximates baseline. PT has implemented strategies and compensations to optimize functioning at home: visual reminder for medications, routine to ensure breakfast, large digital clock for orientation, using aid for paying restaurant bills. He has been completing crossword puzzles and been reading, although it is more challenging than before, he is still able to complete these activities that he enjoys; he takes breaks when needed. SLP continued  education on supporting cognition through lifestyle (ensuring sufficient sleep, eating a diet that incorporates items good for brain health, cardiovascular exercise, socialization, engaging his brain in cognitively challenging activities). Handout provided.  06-29-22: Pt accompanied by wife. Running 10 minutes late. Wife endorsed that they have purchased a digital clock that provides both date and time. Pt has had difficulty remembering to read the clock (scanning), which he is working on in [place]. SLP provided education concerning attention difficulties and how wife can support pt by prompting him to scan. SLP led pt in exercise scanning to the right (weakest area) provided faded verbal prompts to mod I. Instructed pt and wife to practice scanning at clock to read the clock with faded support to increase independence. Pt able to use attention strategies in 3/3 trials resulting in 100% accuracy in ID objects in R visual field.  Pt's sleep has improved since purchasing a timer for his lamp. He is no longer sleeping with the lamp on. Wife endorsed that the external memory aid the clinician provided for medication has been effective.    06-22-22: Pt running 15 minutes late, accompanied by spouse. Wife endorsed that pt still has difficulty remembering to take meds sometimes. SLP re-educated pt and spouse on importance of routine to aid memory. Suggested keeping medication in the same place instead of moving them between two different rooms. Plan is to keep medications in kitchen, but place the meds he takes at night in a separate container in the bedroom. SLP provided signage as external memory aid for pt to hang in kitchen as reminder to take medications in morning. Reminded pt to keep sign in place where it will catch his attention and to move it where it will be most helpful.  Pt reported that he is good with paying bills, calculating tips at restaurants, and managing money overall. Spouse expressed concern for slow  processing with math calculations. SLP facilitated discussion on benefits of taking time and checking calculations to ensure accuracy.   PATIENT EDUCATION: Education details: see above Person educated: Patient and Spouse Education method: Explanation, Demonstration, and Verbal cues Education comprehension: verbalized understanding, returned demonstration, and needs further education  GOALS: Goals reviewed with patient? Yes  SHORT TERM GOALS: Target date: 05-31-22 (addressed today)  Pt will complete CLQT and PROM initial therapy session Baseline: Goal status: MET  2.  Pt will accurately sort medications with use of compensations PRN into pill box with occasional mod-A Baseline:  Goal status: MET  3.  Pt will generate visual aid to assist with increased independence in paying bills with A from SLP and s/o Baseline:  Goal status: PARTIALLY MET  4.  Pt will verbalize appropriate memory strategy given functional scenario with 80% accuracy   Baseline:  Goal status: NOT MET  5.  Pt and s/o will implement external A for orientation (e.g. calendar) with usual cues from care partner to initiate use  Baseline:  Goal status: PARTIALLY MET   LONG TERM GOALS: Target date: 07-26-22  With use of external aid, pt will answer orientation questions with 100% accuracy over 2 sessions Baseline:  Goal status: MET  2.  Pt will verbalize x3 situations in which he successfully employed a memory strategy resulting in subjective satisfaction with ability to recall desired information Baseline:  Goal status: MET  3.  Pt will pay x3 of own bills with supervision from s/o, with use of compensations PRN Baseline:  Goal status: MET  4.  Pt will report improvement via PROM by dc Baseline:  Goal status: deferred, though pt reporting subjective improvement in cognitive function   ASSESSMENT:  CLINICAL IMPRESSION: Pt and spouse report no concerns for cognitive difficulties since previous session. As  pt satisfied with current cognitive status and endorses proximity to baseline, will d/c from [facility] services. Education provided for ongoing HEP and lifestyle habits to support brain function.   OBJECTIVE IMPAIRMENTS: include attention, memory, awareness, executive functioning, and expressive language. These impairments are limiting patient from managing medications, managing appointments, managing finances, household responsibilities, and ADLs/IADLs. Factors affecting potential to achieve goals and functional outcome are ability to learn/carryover information and cooperation/participation level.. Patient will benefit from skilled SLP services to address above impairments and improve overall function.  REHAB POTENTIAL: Fair (carryover and participation)  PLAN:  SLP FREQUENCY: 2x/week  SLP DURATION: 12 weeks  PLANNED INTERVENTIONS: Cueing hierachy, Cognitive reorganization, Internal/external aids, Functional tasks, SLP instruction and feedback, Compensatory strategies, and Patient/family education  SPEECH THERAPY DISCHARGE SUMMARY  Visits from Start of Care: 10  Current functional level related to goals / functional outcomes: Return to engagement in prior activities which exception of driving. Reporting subjective improvement in cognitive function through implementation of strategies, compensations, and occasional cues from care partner.    Remaining deficits: Mild, high level cognitive impairment   Education / Equipment: Internal and external memory strategies, compensations, visual aids, caregiver cueing, HEP   Patient agrees to discharge. Patient goals were met. Patient is being discharged due to being pleased with the current functional level.   Leroy Libman, [place] 07/04/2022, 11:30 AM

## 2022-07-04 NOTE — Therapy (Unsigned)
OUTPATIENT PHYSICAL THERAPY NEURO TREATMENT   Patient Name: Antonio Adams MRN: [id number] DOB:[birth year], 77 y.o., male Today's Date: 07/05/2022   PCP: Scarlette Calico, MD REFERRING PROVIDER: Pricilla Holm, MD  END OF SESSION:  PT End of Session - 07/04/22 1021     Visit Number 9    Number of Visits 17    Date for PT Re-Evaluation 07/22/22   updated POC   Authorization Type [employer] Medicare    Progress Note Due on Visit 10    PT Start Time 1016    PT Stop Time 1100    PT Time Calculation (min) 44 min    Equipment Utilized During Treatment Gait belt    Activity Tolerance Patient tolerated treatment well    Behavior During Therapy WFL for tasks assessed/performed             Past Medical History:  Diagnosis Date   ADENOCARCINOMA, PROSTATE, GLEASON GRADE 6 12/21/2009   prostate cancer   ALLERGIC RHINITIS 03/16/2007   Allergy    Anxiety    Arthritis    back    BACK PAIN WITH RADICULOPATHY 11/23/2008   Cancer of kidney ([place])    partial right kidney removed   CARPAL TUNNEL SYNDROME, BILATERAL 03/16/2007   Chronic back pain    Constipation    takes Senokot daily   Depression    occasionally   DIABETES MELLITUS, TYPE II 03/16/2007   takes Januvia and MEtformin daily   GASTROINTESTINAL HEMORRHAGE, HX OF 03/16/2007   GERD 03/16/2007   takes Omeprazole daily   Glaucoma    mild - no eye drops   Hemorrhoids    History of colon polyps    HYPERLIPIDEMIA 03/16/2007   takes Crestor daily   HYPERTENSION 03/16/2007   takes Diltiazem and Lisinopril daily    Kidney cancer, primary, with metastasis from kidney to other site [facility])    LEG CRAMPS 05/22/2007   Nocturia    Overactive bladder    PARESTHESIA 05/21/2007   Proctitis    PTSD (post-traumatic stress disorder)    wakes up may be dreaming of fighting   Rectal bleeding    Dr.Norins has explained its from the Radiation that he has received   Rectal bleeding    Renal cell carcinoma ([place])    Sleep apnea    uses CPAP  nightly   Stomach cancer ([place])    Urinary frequency    takes Toviaz daily   Past Surgical History:  Procedure Laterality Date   ADRENALECTOMY Right 02/26/2020   Procedure: OPEN RIGHT RENAL CELL METASTATECTOMY AND  ADRENALECTOMY;  Surgeon: [name], MD;  Location: [facility];  Service: Urology;  Laterality: Right;   [place], 2008   repeat surgery; ESI '08   [place]     bilateral   CERVICAL FUSION     COLONOSCOPY     colonosocpy     ESOPHAGOGASTRODUODENOSCOPY     ED   FLEXIBLE SIGMOIDOSCOPY N/A 01/08/2017   Procedure: FLEXIBLE SIGMOIDOSCOPY;  Surgeon: Irene Shipper, MD;  Location: [place];  Service: Endoscopy;  Laterality: N/A;   HOT HEMOSTASIS N/A 01/08/2017   Procedure: HOT HEMOSTASIS (ARGON PLASMA COAGULATION/BICAP);  Surgeon: Irene Shipper, MD;  Location: [facility];  Service: Endoscopy;  Laterality: N/A;   IR RADIOLOGIST EVAL & MGMT  12/17/2019   IR RADIOLOGIST EVAL & MGMT  07/01/2020   IR RADIOLOGIST EVAL & MGMT  08/25/2020   LUMBAR LAMINECTOMY  10/30/2011   Procedure: MICRODISCECTOMY  LUMBAR LAMINECTOMY;  Surgeon: Jessy Oto, MD;  Location: [place];  Service: Orthopedics;  Laterality: Right;  Right L4-5 and L5-S1 Microdiscectomy   LUMBAR LAMINECTOMY  06/17/2012   Procedure: MICRODISCECTOMY LUMBAR LAMINECTOMY;  Surgeon: Jessy Oto, MD;  Location: [place];  Service: Orthopedics;  Laterality: N/A;  Right L5-S1 microdiscectomy   OPEN PARTIAL HEPATECTOMY  N/A 02/26/2020   Procedure: OPEN PARTIAL HEPATECTOMY, CHOLECYSTECTOMY;  Surgeon: Stark Klein, MD;  Location: [facility];  Service: General;  Laterality: N/A;   POLYPECTOMY     PROSTATE SURGERY  march 2012   seed implant   RADIOFREQUENCY ABLATION N/A 07/21/2020   Procedure: CT MICROWAVE ABLATION;  Surgeon: Criselda Peaches, MD;  Location[facility];  Service: Anesthesiology;  Laterality: N/A;   ROBOTIC ASSITED PARTIAL NEPHRECTOMY Right 09/08/2015   Procedure: XI ROBOTIC ASSITED RIGHT PARTIAL NEPHRECTOMY;   Surgeon: [name], MD;  Location[facility];  Service: Urology;  Laterality: Right;  Clamp on: 1033 Clamp off: 1103 Total Clamp Time: 30 minutes   SHOULDER ARTHROSCOPY WITH SUBACROMIAL DECOMPRESSION Left 01/20/2016   Procedure: LEFT SHOULDER ARTHROSCOPY WITH EXTENSIVE  DEBRIDEMENT, ACROMIOPLASTY;  Surgeon: Ninetta Lights, MD;  Location: [place];  Service: Orthopedics;  Laterality: Left;   SHOULDER SURGERY  LFT   Stress Cardiolite  12/24/2003   negative for ischemia   UPPER ESOPHAGEAL ENDOSCOPIC ULTRASOUND (EUS)  04/18/2016   [facility]   UPPER GASTROINTESTINAL ENDOSCOPY     WOUND EXPLORATION     management of a wound that he sustained in the [place]   Patient Active Problem List   Diagnosis Date Noted   Vaccine for VZV (varicella-zoster virus) 06/12/2022   Hx of completed stroke 04/28/2022   Abnormal brain CT 04/07/2022   Encounter for general adult medical examination with abnormal findings 01/23/2022   Anemia due to zinc deficiency 12/20/2021   Low back pain 12/15/2021   Symptomatic anemia 12/15/2021   Cancer related pain 12/15/2021   Chronic heart failure with preserved ejection fraction (HFpEF) ([place]) 08/12/2021   Tinea cruris 08/12/2021   Anemia due to chronic kidney disease 07/20/2021   CKD (chronic kidney disease) stage 5, GFR less than 15 ml/min (HCC) 05/24/2021   BPH (benign prostatic hyperplasia) 05/24/2021   Diabetic renal disease ([place]) 05/20/2021   Dry eye syndrome of bilateral lacrimal glands 05/20/2021   Senile nuclear sclerosis 05/20/2021   Vitamin D deficiency 05/20/2021   Vitreous degeneration, unspecified eye 05/20/2021   Intrinsic eczema 04/18/2021   Atherosclerosis of aorta ([place]) 12/30/2020   Mixed conductive and sensorineural hearing loss of left ear with restricted hearing of right ear 09/01/2020   Tinnitus of both ears 05/20/2020   Metastatic renal cell carcinoma ([place]) 02/26/2020   Diabetic polyneuropathy associated with type 2  diabetes mellitus ([place]) 12/18/2019   Murmur, cardiac 10/08/2018   DDD (degenerative disc disease), cervical 10/08/2018   OAB (overactive bladder) 04/04/2018   Seasonal allergic rhinitis due to pollen 11/01/2016   Microalbuminuria due to type 2 diabetes mellitus ([place]) 07/27/2016   Varicose veins of both lower extremities 08/06/2015   Varicose veins of lower extremities with ulcer ([place]) 06/16/2015   Internal hemorrhoid, bleeding 08/03/2014   Post traumatic stress disorder (PTSD) 01/29/2014   Spinal stenosis of lumbar region with radiculopathy 10/30/2011    Class: Chronic   Obstructive sleep apnea 11/24/2010   Obesity 11/24/2010   Routine general medical examination at a health care facility 11/24/2010   ADENOCARCINOMA, PROSTATE, GLEASON GRADE 6 12/21/2009  Essential hypertension 03/16/2007   GERD 03/16/2007    ONSET DATE: 04/07/22  REFERRING DIAG: Z86.73 (ICD-10-CM) - Hx of completed stroke  THERAPY DIAG:  Muscle weakness (generalized)  Other lack of coordination  Unsteadiness on feet  Other abnormalities of gait and mobility  Rationale for Evaluation and Treatment: Rehabilitation  SUBJECTIVE:                                                                                                                                                                                             SUBJECTIVE STATEMENT: He took all medicines this morning and denies falls or pain. Pt accompanied by: significant other [place])  PERTINENT HISTORY: JLYNN ENBERG is a 77 y.o. R-handed male with history of DM2 with diabetic polyneuropathy, OSA on nightly CPAP, PTSD, history of lumbar stenosis with chronic back pain, metastatic renal carcinoma with lymphadenopathy and questionable hepatic involvement noted in 2022, not on chemo due to poor tolerance to the medications, and without significant progression since May 2023, only on surveillance imaging, stage IV, eczema, vitamin D deficiency, CKD stage  V refusing HD, BPH, anemia of CKD  Zinc deficiency, chronic heart failure, mild cognitive impairment, presenting for evaluation of left PCA territory acute CVA.  PAIN:  Are you having pain? No  PRECAUTIONS: Fall  WEIGHT BEARING RESTRICTIONS: No  OBJECTIVE:  TODAY'S TREATMENT:     Vitals (LUE in sitting prior to activity):   Today's Vitals   07/04/22 1018  BP: (!) 148/91  Pulse: 68   -Foam beam forward and backward tandem walking x3 each direction w/ BUE support -Forward tandem on beam alternating LE lateral cone taps 3x10' -Standing on medium tilt board 3x44mn hold progressing from BUE support to none w/ intermittent anterior/posterior LOB due to overcorrection, edu on improper hip strategy vs ankle strategy and facilitated COG in upright posture for pt -mini squats x16 in small sets of varying number progressing to intermittent no UE support w/ LOB x5 in posterior direction due to anterior hip overcorrection on return to upright -Lateral stepping blaze pod taps 2x1 minute rounds w/ 20 second rest between sets, 9 and 14 taps -Forward and backward ambulation w/ lateral blaze pod taps 2x156mute rounds w/ 20 second rest between sets, 12 taps both rounds.  Heavy cues for scanning and retro-stepping to prevent compensation. -Blaze pods in semi-circle on varied surface heights to 4", distracting color x1, 2x1m69mte rounds w/ 30 second rest between sets, 14 and 20 taps, pt reports this is much harder due to both scanning demand and height of tap surfaces; heavy cuing to safely approximate to target during SLS as pt  tends to reach LE far outside BOS resulting in lateral LOB  PATIENT EDUCATION: Education details: Continue HEP. Person educated: Patient and Spouse Education method: Explanation, Demonstration, and Verbal cues Education comprehension: verbalized understanding and needs further education  HOME EXERCISE PROGRAM:  Access Code: [email or web address] URL:  [email or web address] Date: 06/29/2022 Prepared by: Elease Etienne  Exercises - Corner Balance Feet Together With Eyes Closed  - 1 x daily - 5 x weekly - 1 sets - 2-3 reps - 30-45 seconds hold - Supine Bridge  - 1 x daily - 4-5 x weekly - 2 sets - 10 reps - Sit to Stand with Arms Crossed  - 1 x daily - 5 x weekly - 1-2 sets - 8 reps - Standing Hip Extension with Leg Bent and Support  - 1 x daily - 4-5 x weekly - 1 sets - 10 reps  GOALS: Goals reviewed with patient? Yes  SHORT TERM GOALS: Target date: 05/26/22  Pt will be IND with initial HEP in order to indicate improved functional mobility and dec fall risk. Baseline:  Pt states compliance and no issues (1/18) Goal status: MET  2.  Pt will improve gait speed to >/=2.62 ft/sec w/ SPC in order to indicate dec fall risk.  Baseline: 2.00 ft/sec; 1.90 ft/sec (1/18) Goal status: NOT MET  3.  Pt will ambulate 45' without at mod I level with only minimal Trendelenburg in order to indicate more independent household ambulation.   Baseline: ongoing compensated right trendelenburg x200' SBA (1/18) Goal status: IN PROGRESS  4.  Pt will improve FGA to >/=18/30 in order to indicate dec fall risk.   Baseline: 12/30; 11/30 (1/23) Goal status: NOT MET  5.  Pt will improve 5TSS to </=30 secs without UE support only in order to indicate dec fall risk and improved functional strength.   Baseline: approx 37 secs with hands on lap and mild anterior LOB; 26.59 sec no UE support or LOB (1/18) Goal status: MET  6.  Pt will negotiate up/down 4 steps with single rail/cane, up/down ramp and curb and ambulate x 500' outdoors over unlevel paved surfaces at S level in order to indicate improved community mobility.   Baseline: 4 stairs right rail, curb and ramp SBA (1/18); pt declines to ambulate outside due to cold weather (1/23) Goal status: IN PROGRESS  UPDATED LONG TERM GOALS: Target date: 07/22/22  Pt will be IND with final HEP in  order to indicate improved functional mobility and dec fall risk. Baseline:  Goal status: ONGOING  2.  Pt will improve FGA to >/=23/30 in order to indicate dec fall risk.   Baseline: 13/30 06/22/22 Goal status: ONGOING  3.  Pt will improve 5TSS to </=20 secs without UE support only in order to indicate dec fall risk and improved functional strength.   Baseline: 27.22 with hands on lap then use of arm rests Goal status: ONGOING  4.  Pt will improve gait speed to >/=2.62 ft/sec w/o AD in order to indicate dec fall risk.  Baseline: 2.38 ft/sec 06/22/22 Goal status: ONGOING   5.  Pt will negotiate up/down 4 steps with single rail, up/down ramp and curb and ambulate x 500' outdoors over unlevel paved surfaces without device at S level in order to indicate improved community mobility.   Baseline:  Goal status: ONGOING      ASSESSMENT:  CLINICAL IMPRESSION: Emphasis of skilled PT session on dynamic balance w/ increased glute engagement for stability during stance.  Pt demonstrates improved step  size during blaze pod tasks with external cuing, but struggled with safe approximation to pods to prevent LOB in SLS.  He continues to have some hip and quad weakness noted during ambulation w/ and w/o cane.  Will continue per POC.   OBJECTIVE IMPAIRMENTS: Abnormal gait, decreased activity tolerance, decreased balance, decreased endurance, decreased mobility, decreased strength, impaired perceived functional ability, and postural dysfunction.   ACTIVITY LIMITATIONS: carrying, lifting, bending, standing, squatting, stairs, transfers, and locomotion level  PARTICIPATION LIMITATIONS: meal prep, cleaning, medication management, personal finances, driving, and community activity  PERSONAL FACTORS: 3+ comorbidities: see above  are also affecting patient's functional outcome.   REHAB POTENTIAL: Good  CLINICAL DECISION MAKING: Evolving/moderate complexity  EVALUATION COMPLEXITY:  Moderate  PLAN:  PT FREQUENCY: 2x/week  PT DURATION: 4 weeks  PLANNED INTERVENTIONS: Therapeutic exercises, Therapeutic activity, Neuromuscular re-education, Balance training, Gait training, Patient/Family education, Self Care, Stair training, Vestibular training, DME instructions, and Aquatic Therapy  PLAN FOR NEXT SESSION: gait with and without cane, dynamic stability, hip extension, compliant surfaces, hamstring strength, blaze pods-varied surfaces like compliant or obstacle course, hurdles   Elease Etienne, PT, DPT [facility] [address] [place], [place], [address] Phone: [phone]   Fax:  [phone] 07/05/22, 7:12 AM

## 2022-07-04 NOTE — Therapy (Signed)
OUTPATIENT OCCUPATIONAL THERAPY NEURO TREATMENT  Patient Name: Antonio Adams MRN: [id number] DOB:07/13/1945, 77 y.o., male Today's Date: 07/04/2022  PCP: Dr. Sharlet Salina REFERRING PROVIDER: Dr. Sharlet Salina  END OF SESSION:  OT End of Session - 07/04/22 0939     Visit Number 9    Number of Visits 25    Date for OT Re-Evaluation 07/26/22    Authorization Type [employer] Medicare    Authorization - Visit Number 9    Authorization - Number of Visits 10    Progress Note Due on Visit 10    OT Start Time [phone]    OT Stop Time 1015    OT Time Calculation (min) 38 min    Activity Tolerance Patient tolerated treatment well    Behavior During Therapy WFL for tasks assessed/performed               Past Medical History:  Diagnosis Date   ADENOCARCINOMA, PROSTATE, GLEASON GRADE 6 12/21/2009   prostate cancer   ALLERGIC RHINITIS 03/16/2007   Allergy    Anxiety    Arthritis    back    BACK PAIN WITH RADICULOPATHY 11/23/2008   Cancer of kidney ([place])    partial right kidney removed   CARPAL TUNNEL SYNDROME, BILATERAL 03/16/2007   Chronic back pain    Constipation    takes Senokot daily   Depression    occasionally   DIABETES MELLITUS, TYPE II 03/16/2007   takes Januvia and MEtformin daily   GASTROINTESTINAL HEMORRHAGE, HX OF 03/16/2007   GERD 03/16/2007   takes Omeprazole daily   Glaucoma    mild - no eye drops   Hemorrhoids    History of colon polyps    HYPERLIPIDEMIA 03/16/2007   takes Crestor daily   HYPERTENSION 03/16/2007   takes Diltiazem and Lisinopril daily    Kidney cancer, primary, with metastasis from kidney to other site [facility])    LEG CRAMPS 05/22/2007   Nocturia    Overactive bladder    PARESTHESIA 05/21/2007   Proctitis    PTSD (post-traumatic stress disorder)    wakes up may be dreaming of fighting   Rectal bleeding    Dr.Norins has explained its from the Radiation that he has received   Rectal bleeding    Renal cell carcinoma ([place])    Sleep apnea    uses CPAP  nightly   Stomach cancer ([place])    Urinary frequency    takes Toviaz daily   Past Surgical History:  Procedure Laterality Date   ADRENALECTOMY Right 02/26/2020   Procedure: OPEN RIGHT RENAL CELL METASTATECTOMY AND  ADRENALECTOMY;  Surgeon: [name], MD;  Location: [facility];  Service: Urology;  Laterality: Right;   [place], 2008   repeat surgery; ESI '08   [place]     bilateral   CERVICAL FUSION     COLONOSCOPY     colonosocpy     ESOPHAGOGASTRODUODENOSCOPY     ED   FLEXIBLE SIGMOIDOSCOPY N/A 01/08/2017   Procedure: FLEXIBLE SIGMOIDOSCOPY;  Surgeon: Irene Shipper, MD;  Location: [place];  Service: Endoscopy;  Laterality: N/A;   HOT HEMOSTASIS N/A 01/08/2017   Procedure: HOT HEMOSTASIS (ARGON PLASMA COAGULATION/BICAP);  Surgeon: Irene Shipper, MD;  Location[facility] ENDOSCOPY;  Service: Endoscopy;  Laterality: N/A;   IR RADIOLOGIST EVAL & MGMT  12/17/2019   IR RADIOLOGIST EVAL & MGMT  07/01/2020   IR RADIOLOGIST EVAL & MGMT  08/25/2020   LUMBAR LAMINECTOMY  10/30/2011  Procedure: MICRODISCECTOMY LUMBAR LAMINECTOMY;  Surgeon: Jessy Oto, MD;  Location: [place];  Service: Orthopedics;  Laterality: Right;  Right L4-5 and L5-S1 Microdiscectomy   LUMBAR LAMINECTOMY  06/17/2012   Procedure: MICRODISCECTOMY LUMBAR LAMINECTOMY;  Surgeon: Jessy Oto, MD;  Location: [place];  Service: Orthopedics;  Laterality: N/A;  Right L5-S1 microdiscectomy   OPEN PARTIAL HEPATECTOMY  N/A 02/26/2020   Procedure: OPEN PARTIAL HEPATECTOMY, CHOLECYSTECTOMY;  Surgeon: Stark Klein, MD;  Location: [facility];  Service: General;  Laterality: N/A;   POLYPECTOMY     PROSTATE SURGERY  march 2012   seed implant   RADIOFREQUENCY ABLATION N/A 07/21/2020   Procedure: CT MICROWAVE ABLATION;  Surgeon: Criselda Peaches, MD;  Location: [facility];  Service: Anesthesiology;  Laterality: N/A;   ROBOTIC ASSITED PARTIAL NEPHRECTOMY Right 09/08/2015   Procedure: XI ROBOTIC ASSITED RIGHT PARTIAL NEPHRECTOMY;   Surgeon: [name], MD;  Location: [facility];  Service: Urology;  Laterality: Right;  Clamp on: 1033 Clamp off: 1103 Total Clamp Time: 30 minutes   SHOULDER ARTHROSCOPY WITH SUBACROMIAL DECOMPRESSION Left 01/20/2016   Procedure: LEFT SHOULDER ARTHROSCOPY WITH EXTENSIVE  DEBRIDEMENT, ACROMIOPLASTY;  Surgeon: Ninetta Lights, MD;  Location: [place];  Service: Orthopedics;  Laterality: Left;   SHOULDER SURGERY  LFT   Stress Cardiolite  12/24/2003   negative for ischemia   UPPER ESOPHAGEAL ENDOSCOPIC ULTRASOUND (EUS)  04/18/2016   [facility]   UPPER GASTROINTESTINAL ENDOSCOPY     WOUND EXPLORATION     management of a wound that he sustained in the [place]   Patient Active Problem List   Diagnosis Date Noted   Vaccine for VZV (varicella-zoster virus) 06/12/2022   Hx of completed stroke 04/28/2022   Abnormal brain CT 04/07/2022   Encounter for general adult medical examination with abnormal findings 01/23/2022   Anemia due to zinc deficiency 12/20/2021   Low back pain 12/15/2021   Symptomatic anemia 12/15/2021   Cancer related pain 12/15/2021   Chronic heart failure with preserved ejection fraction (HFpEF) ([place]) 08/12/2021   Tinea cruris 08/12/2021   Anemia due to chronic kidney disease 07/20/2021   CKD (chronic kidney disease) stage 5, GFR less than 15 ml/min (HCC) 05/24/2021   BPH (benign prostatic hyperplasia) 05/24/2021   Diabetic renal disease ([place]) 05/20/2021   Dry eye syndrome of bilateral lacrimal glands 05/20/2021   Senile nuclear sclerosis 05/20/2021   Vitamin D deficiency 05/20/2021   Vitreous degeneration, unspecified eye 05/20/2021   Intrinsic eczema 04/18/2021   Atherosclerosis of aorta ([place]) 12/30/2020   Mixed conductive and sensorineural hearing loss of left ear with restricted hearing of right ear 09/01/2020   Tinnitus of both ears 05/20/2020   Metastatic renal cell carcinoma ([place]) 02/26/2020   Diabetic polyneuropathy associated with type 2  diabetes mellitus ([place]) 12/18/2019   Murmur, cardiac 10/08/2018   DDD (degenerative disc disease), cervical 10/08/2018   OAB (overactive bladder) 04/04/2018   Seasonal allergic rhinitis due to pollen 11/01/2016   Microalbuminuria due to type 2 diabetes mellitus ([place]) 07/27/2016   Varicose veins of both lower extremities 08/06/2015   Varicose veins of lower extremities with ulcer ([place]) 06/16/2015   Internal hemorrhoid, bleeding 08/03/2014   Post traumatic stress disorder (PTSD) 01/29/2014   Spinal stenosis of lumbar region with radiculopathy 10/30/2011    Class: Chronic   Obstructive sleep apnea 11/24/2010   Obesity 11/24/2010   Routine general medical examination at a health care facility 11/24/2010   ADENOCARCINOMA, PROSTATE, GLEASON GRADE 6 12/21/2009  Essential hypertension 03/16/2007   GERD 03/16/2007    ONSET DATE: 04/07/22  REFERRING DIAG: Diagnosis Z86.73 (ICD-10-CM) - Personal history of transient ischemic attack (TIA), and cerebral infarction without residual deficits  THERAPY DIAG:  Muscle weakness (generalized)  Other lack of coordination  Visuospatial deficit  Attention and concentration deficit  Unsteadiness on feet  Rationale for Evaluation and Treatment: Rehabilitation  SUBJECTIVE:   SUBJECTIVE STATEMENT: No pain  Pt accompanied by: significant other fiancee [name]  PERTINENT HISTORY: 77 y.o. R-handed male with history of DM2 with diabetic polyneuropathy, OSA on nightly CPAP, PTSD, history of lumbar stenosis with chronic back pain, metastatic renal carcinoma with lymphadenopathy and questionable hepatic involvement noted in 2022, not on chemo due to poor tolerance to the medications, and without significant progression since May 2023, only on surveillance imaging, stage IV, eczema, vitamin D deficiency, CKD stage V refusing HD, BPH, anemia of CKD  Zinc deficiency, chronic heart failure, mild cognitive impairment, s/p left PCA territory acute CVA on 04/07/22    DIAGNOSTIC FINDINGS:  MRI of the brain showing a left PCA large infarct and punctate right PCA infarct MRA remarkable for left PCA P3 branch occlusion, ICA siphon atherosclerosis with possible tandem significant stenosis of the right siphon  PRECAUTIONS: Fall, hx of CA  WEIGHT BEARING RESTRICTIONS: No  PAIN:  Are you having pain? No  FALLS: Has patient fallen in last 6 months? Yes. Number of falls 1  LIVING ENVIRONMENT: Lives with: lives alone, fiancee is there most of the time Lives in: House/apartment Stairs: 1 level stairs to enter Has following equipment at home: Single point cane  PLOF: Independent  PATIENT GOALS: to get right  OBJECTIVE:   HAND DOMINANCE: Right  ADLs: Overall ADLs: fall risk in standing Transfers/ambulation related to [place] Eating: mod I Grooming: mod I UB Dressing: mod I  LB Dressing: supervision, increased time required, therapist recommends pt sits for LB dressing due to fall risk Toileting: mod I using cane Bathing: supervision Tub Shower transfers: min A currently standing in shower, no grab bars, using tub shower does not have seat Equipment: none  IADLs: Shopping: has assistance Light housekeeping: fiancee handles Meal Prep: does not cook Community mobility: ambulates with cane, supervision-minguard, unsteady and walks close to items Medication management: fiancee is helping with meds Financial management: fiancee is assisting  Handwriting: 100% legible  MOBILITY STATUS:  ambulates with minguard with cane , some unsteadiness and walks close to walls and doorways   FUNCTIONAL OUTCOME MEASURES: FOTO: TBA  UPPER EXTREMITY ROM:  remaining AROM is WFLS   Active ROM Right eval Left eval  Shoulder flexion 105 105  Shoulder abduction    Shoulder adduction    Shoulder extension    Shoulder internal rotation    Shoulder external rotation    Elbow flexion    Elbow extension -15 -20  Wrist flexion    Wrist extension     Wrist ulnar deviation    Wrist radial deviation    Wrist pronation    Wrist supination    (Blank rows = not tested)  UPPER EXTREMITY MMT:     MMT Right eval Left eval  Shoulder flexion 4-/5 3+/5  Shoulder abduction    Shoulder adduction    Shoulder extension    Shoulder internal rotation    Shoulder external rotation    Middle trapezius    Lower trapezius    Elbow flexion 4+/5 4+/5  Elbow extension 4/5 4/5  Wrist flexion    Wrist extension  Wrist ulnar deviation    Wrist radial deviation    Wrist pronation    Wrist supination    (Blank rows = not tested)  HAND FUNCTION: Grip strength: Right: 46.9 lbs; Left: 40.3 lbs  COORDINATION: 9 Hole Peg test: Right: 44 sec; Left: 40 sec   SENSATION: Light touch: Impaired  for ring and small finger R hand ulnar distribution, reports this was since CA treatment  EDEMA: n/a   COGNITION: Overall cognitive status: Impaired Decreased short term memory, delayed processing and decreased awareness  VISION:  Baseline vision: Wears glasses all the time   VISION ASSESSMENT: Number cancellation task 1.5 M : 7/80 missed, 91% Visual Fields: per hospital records R homonymous hemianopsia  Vision to be further assessed in a functional context  Patient has difficulty with following activities due to following visual impairments: ambulates close to walls and doorways on R side   OBSERVATIONS: Pt with minimal verbalizations and poor awareness of deficits   TODAY'S TREATMENT:      07/04/22 -  BP at beginning of O.T. session BP 176/93 Reviewed red theraband HEP 10 reps each, min-mod v.c for positioning Pipe tree design x 2 , mod v.c for design and organization of pieces, by size increased time required   PATIENT EDUCATION: Education details: review of red theraband HEP, 10 reps each Person educated: Patient and Spouse Education method: Explanation, Demonstration, min-mod v.c Education comprehension: verbalized understanding,  returned demonstration,needs v.c, pt's spouse can assist    HOME EXERCISE PROGRAM: 05/30/22: Visual scanning strategies, coordination HEP 06/01/22: cane HEP   06/06/22: memory strategies 06/20/22: Red theraband HEP   GOALS: Goals reviewed with patient? Yes  SHORT TERM GOALS: Target date: 06/02/22  I with initial HEP  Goal status: MET w/ cues  2.  Pt will perform tub transfers safely mod I using tub bench prn. Baseline: doe not have tub bench yet, currently supervision, min A Goal status: MET  3.  Pt/caregiver will verbalize understanding of compensations for visual deficits  Goal status: MET  4.  Pt will demonstrate improved fine motor coordination  for ADLs with RUE as evidenced by decreasing 9 hole peg test score by 3 secs Baseline: 9 Hole Peg test: Right: 44 sec; Left: 40 sec Goal status:  met 39.09 secs  07/04/22  5.   Pt will navigate a busy environment and locate items with 75% accuracy without bumping into items. Baseline: nearly bumps into wall and doorway on right side Goal status: IN PROGRESS (56% accuracy)   6.  Pt will verbalize understanding of compensations for short term memory deficits. Baseline:  Goal status: PARTIALLY MET - issued and reviewed x2, however pt still has a hard time recalling strategies   LONG TERM GOALS: Target date: 07/25/21  I with updated HEP Baseline:  Goal status: INITIAL  2.  Pt will navigate a busy environment and locate items with 90% accuracy. Baseline: nearly bumps into wall and doorway on right side Goal status:  ongoing   3.  Pt will perform all basic ADLs mod I with good safety awareness. Baseline: supervision, min A Goal status: met mod I  4.  Pt will demonstrate improved fine motor coordination with RUE  for ADLs as evidenced by decreasing 9 hole peg test score by 6 secs form initial eval. Baseline: 9 Hole Peg test: Right: 44 sec; Left: 40 sec Goal status: in progress, 39.09 secs  07/04/22  5.  Pt will demonstrate  ability to retrieve lightweight object at 110* shoulder  flexion and -10 elbow extension with RUE Baseline: shoulder flexion 105, elbow extension -20 Goal status: INITIAL  6.  Pt will demonstrate ability to retrieve lightweight object at 110* shoulder flexion and -15 elbow extension with LUE Baseline: shoulder flexion 105, elbow extension -20 Goal status: inital  ASSESSMENT:  CLINICAL IMPRESSION: Pt progressing towards goals. Pt continues to require v.c due to cognitive deficits. PERFORMANCE DEFICITS: in functional skills including ADLs, IADLs, coordination, dexterity, proprioception, sensation, tone, ROM, strength, flexibility, Fine motor control, Gross motor control, mobility, balance, endurance, decreased knowledge of precautions, decreased knowledge of use of DME, vision, and UE functional use, cognitive skills including energy/drive, learn, memory, problem solving, safety awareness, thought, and understand, and psychosocial skills including coping strategies, environmental adaptation, habits, interpersonal interactions, and routines and behaviors.   IMPAIRMENTS: are limiting patient from ADLs, IADLs, rest and sleep, play, leisure, and social participation.   CO-MORBIDITIES: may have co-morbidities  that affects occupational performance. Patient will benefit from skilled OT to address above impairments and improve overall function.  MODIFICATION OR ASSISTANCE TO COMPLETE EVALUATION: No modification of tasks or assist necessary to complete an evaluation.  OT OCCUPATIONAL PROFILE AND HISTORY: Detailed assessment: Review of records and additional review of physical, cognitive, psychosocial history related to current functional performance.  CLINICAL DECISION MAKING: LOW - limited treatment options, no task modification necessary  REHAB POTENTIAL: Good  EVALUATION COMPLEXITY: Low   PLAN:  OT FREQUENCY: 2x/week  OT DURATION: 12 weeks  PLANNED INTERVENTIONS: self care/ADL training,  therapeutic exercise, therapeutic activity, neuromuscular re-education, manual therapy, passive range of motion, gait training, balance training, stair training, functional mobility training, aquatic therapy, splinting, paraffin, fluidotherapy, moist heat, cryotherapy, contrast bath, patient/family education, cognitive remediation/compensation, visual/perceptual remediation/compensation, energy conservation, coping strategies training, DME and/or AE instructions, and Re-evaluation  RECOMMENDED OTHER SERVICES: PT, ST  CONSULTED AND AGREED WITH PLAN OF CARE: Patient and family member/caregiver  PLAN FOR NEXT SESSION:     10th visit pn, continue to work on attention, visual scanning, coordination   Verdella Laidlaw, OTR/L 07/04/2022, 12:13 PM

## 2022-07-04 NOTE — Patient Instructions (Signed)
Keeping your Brain Healthy MIND diet healthy items the MIND diet guidelines* suggest include: 3+ servings a day of whole grains 1+ servings a day of vegetables (other than green leafy) 6+ servings a week of green leafy vegetables Consider dosing if you won't be having a salad: eat a daily handful of spinach, they make greens supplements  5+ servings a week of nuts 4+ meals a week of beans 2+ servings a week of berries 2+ meals a week of poultry 1+ meals a week of fish Mainly olive oil if added fat is used The unhealthy items, which are higher in saturated and trans fat, include: Less than 5 servings a week of pastries and sweets Less than 4 servings a week of red meat (including beef, pork, lamb, and products made from these meats) Less than one serving a week of cheese and fried foods Less than 1 tablespoon a day of butter   Exercise Consider what can I do safely?  Ask you PT, what can I do? Optimal exercise dosing in 150 minutes a week-- you can see benefits just increasing a little  Choreographed dancing is a wonderful physical and mental exercise!!   Cognitive work Horticulturist, commercial something new about a topic that is interesting to you Write down 3-5 things you learned After 10-15 minutes, see what you remember May be helpful to read again Share what you learned with a communication partner Partake in and learn a new hobby Plan a menu Participate in household chores and decisions  Participate in [place] a party, trip or tailgate with all of the details  Participate in your current hobbies Google search for items (even if you're not really going to buy anything) and compare prices and features   Technical sales engineer Engaging with others is good for your language, your mental health, and your brain function

## 2022-07-05 ENCOUNTER — Other Ambulatory Visit: Payer: Self-pay

## 2022-07-05 ENCOUNTER — Inpatient Hospital Stay (HOSPITAL_COMMUNITY): Admission: RE | Admit: 2022-07-05 | Payer: Medicare Other | Source: Ambulatory Visit

## 2022-07-05 DIAGNOSIS — C641 Malignant neoplasm of right kidney, except renal pelvis: Secondary | ICD-10-CM

## 2022-07-06 ENCOUNTER — Ambulatory Visit: Payer: Medicare Other | Admitting: Rehabilitation

## 2022-07-06 ENCOUNTER — Encounter: Payer: Medicare Other | Admitting: Speech Pathology

## 2022-07-06 ENCOUNTER — Encounter: Payer: Self-pay | Admitting: Rehabilitation

## 2022-07-06 ENCOUNTER — Ambulatory Visit: Payer: Medicare Other | Admitting: Occupational Therapy

## 2022-07-06 ENCOUNTER — Encounter: Payer: Self-pay | Admitting: Occupational Therapy

## 2022-07-06 DIAGNOSIS — R41842 Visuospatial deficit: Secondary | ICD-10-CM

## 2022-07-06 DIAGNOSIS — R2689 Other abnormalities of gait and mobility: Secondary | ICD-10-CM

## 2022-07-06 DIAGNOSIS — R278 Other lack of coordination: Secondary | ICD-10-CM | POA: Diagnosis not present

## 2022-07-06 DIAGNOSIS — M6281 Muscle weakness (generalized): Secondary | ICD-10-CM | POA: Diagnosis not present

## 2022-07-06 DIAGNOSIS — R2681 Unsteadiness on feet: Secondary | ICD-10-CM | POA: Diagnosis not present

## 2022-07-06 NOTE — Therapy (Signed)
OUTPATIENT PHYSICAL THERAPY NEURO TREATMENT and PROGRESS NOTE   Patient Name: Antonio Adams MRN: [id number] DOB:05/23/1945, 77 y.o., male Today's Date: 07/06/2022   PCP: Scarlette Calico, MD REFERRING PROVIDER: Pricilla Holm, MD  END OF SESSION:  PT End of Session - 07/06/22 1029     Visit Number 10    Number of Visits 17    Date for PT Re-Evaluation 07/22/22   updated POC   Authorization Type [employer] Medicare    Progress Note Due on Visit 20    PT Start Time 1021    PT Stop Time 1104    PT Time Calculation (min) 43 min    Equipment Utilized During Treatment Gait belt    Activity Tolerance Patient tolerated treatment well    Behavior During Therapy WFL for tasks assessed/performed             Past Medical History:  Diagnosis Date   ADENOCARCINOMA, PROSTATE, GLEASON GRADE 6 12/21/2009   prostate cancer   ALLERGIC RHINITIS 03/16/2007   Allergy    Anxiety    Arthritis    back    BACK PAIN WITH RADICULOPATHY 11/23/2008   Cancer of kidney ([place])    partial right kidney removed   CARPAL TUNNEL SYNDROME, BILATERAL 03/16/2007   Chronic back pain    Constipation    takes Senokot daily   Depression    occasionally   DIABETES MELLITUS, TYPE II 03/16/2007   takes Januvia and MEtformin daily   GASTROINTESTINAL HEMORRHAGE, HX OF 03/16/2007   GERD 03/16/2007   takes Omeprazole daily   Glaucoma    mild - no eye drops   Hemorrhoids    History of colon polyps    HYPERLIPIDEMIA 03/16/2007   takes Crestor daily   HYPERTENSION 03/16/2007   takes Diltiazem and Lisinopril daily    Kidney cancer, primary, with metastasis from kidney to other site [facility])    LEG CRAMPS 05/22/2007   Nocturia    Overactive bladder    PARESTHESIA 05/21/2007   Proctitis    PTSD (post-traumatic stress disorder)    wakes up may be dreaming of fighting   Rectal bleeding    Dr.Norins has explained its from the Radiation that he has received   Rectal bleeding    Renal cell carcinoma ([place])    Sleep apnea     uses CPAP nightly   Stomach cancer ([place])    Urinary frequency    takes Toviaz daily   Past Surgical History:  Procedure Laterality Date   ADRENALECTOMY Right 02/26/2020   Procedure: OPEN RIGHT RENAL CELL METASTATECTOMY AND  ADRENALECTOMY;  Surgeon: [name], MD;  Location: [facility];  Service: Urology;  Laterality: Right;   [place], 2008   repeat surgery; ESI '08   [place]     bilateral   CERVICAL FUSION     COLONOSCOPY     colonosocpy     ESOPHAGOGASTRODUODENOSCOPY     ED   FLEXIBLE SIGMOIDOSCOPY N/A 01/08/2017   Procedure: FLEXIBLE SIGMOIDOSCOPY;  Surgeon: Irene Shipper, MD;  Location: [place];  Service: Endoscopy;  Laterality: N/A;   HOT HEMOSTASIS N/A 01/08/2017   Procedure: HOT HEMOSTASIS (ARGON PLASMA COAGULATION/BICAP);  Surgeon: Irene Shipper, MD;  [facility];  Service: Endoscopy;  Laterality: N/A;   IR RADIOLOGIST EVAL & MGMT  12/17/2019   IR RADIOLOGIST EVAL & MGMT  07/01/2020   IR RADIOLOGIST EVAL & MGMT  08/25/2020   LUMBAR LAMINECTOMY  10/30/2011  Procedure: MICRODISCECTOMY LUMBAR LAMINECTOMY;  Surgeon: Jessy Oto, MD;  Location: [place];  Service: Orthopedics;  Laterality: Right;  Right L4-5 and L5-S1 Microdiscectomy   LUMBAR LAMINECTOMY  06/17/2012   Procedure: MICRODISCECTOMY LUMBAR LAMINECTOMY;  Surgeon: Jessy Oto, MD;  Location: [place];  Service: Orthopedics;  Laterality: N/A;  Right L5-S1 microdiscectomy   OPEN PARTIAL HEPATECTOMY  N/A 02/26/2020   Procedure: OPEN PARTIAL HEPATECTOMY, CHOLECYSTECTOMY;  Surgeon: Stark Klein, MD;  Location: [facility];  Service: General;  Laterality: N/A;   POLYPECTOMY     PROSTATE SURGERY  march 2012   seed implant   RADIOFREQUENCY ABLATION N/A 07/21/2020   Procedure: CT MICROWAVE ABLATION;  Surgeon: Criselda Peaches, MD;  Location: [facility];  Service: Anesthesiology;  Laterality: N/A;   ROBOTIC ASSITED PARTIAL NEPHRECTOMY Right 09/08/2015   Procedure: XI ROBOTIC ASSITED RIGHT PARTIAL  NEPHRECTOMY;  Surgeon: [name], MD;  Location: [facility];  Service: Urology;  Laterality: Right;  Clamp on: 1033 Clamp off: 1103 Total Clamp Time: 30 minutes   SHOULDER ARTHROSCOPY WITH SUBACROMIAL DECOMPRESSION Left 01/20/2016   Procedure: LEFT SHOULDER ARTHROSCOPY WITH EXTENSIVE  DEBRIDEMENT, ACROMIOPLASTY;  Surgeon: Ninetta Lights, MD;  Location: [place];  Service: Orthopedics;  Laterality: Left;   SHOULDER SURGERY  LFT   Stress Cardiolite  12/24/2003   negative for ischemia   UPPER ESOPHAGEAL ENDOSCOPIC ULTRASOUND (EUS)  04/18/2016   [facility]   UPPER GASTROINTESTINAL ENDOSCOPY     WOUND EXPLORATION     management of a wound that he sustained in the [place]   Patient Active Problem List   Diagnosis Date Noted   Vaccine for VZV (varicella-zoster virus) 06/12/2022   Hx of completed stroke 04/28/2022   Abnormal brain CT 04/07/2022   Encounter for general adult medical examination with abnormal findings 01/23/2022   Anemia due to zinc deficiency 12/20/2021   Low back pain 12/15/2021   Symptomatic anemia 12/15/2021   Cancer related pain 12/15/2021   Chronic heart failure with preserved ejection fraction (HFpEF) ([place]) 08/12/2021   Tinea cruris 08/12/2021   Anemia due to chronic kidney disease 07/20/2021   CKD (chronic kidney disease) stage 5, GFR less than 15 ml/min (HCC) 05/24/2021   BPH (benign prostatic hyperplasia) 05/24/2021   Diabetic renal disease ([place]) 05/20/2021   Dry eye syndrome of bilateral lacrimal glands 05/20/2021   Senile nuclear sclerosis 05/20/2021   Vitamin D deficiency 05/20/2021   Vitreous degeneration, unspecified eye 05/20/2021   Intrinsic eczema 04/18/2021   Atherosclerosis of aorta ([place]) 12/30/2020   Mixed conductive and sensorineural hearing loss of left ear with restricted hearing of right ear 09/01/2020   Tinnitus of both ears 05/20/2020   Metastatic renal cell carcinoma ([place]) 02/26/2020   Diabetic polyneuropathy associated  with type 2 diabetes mellitus ([place]) 12/18/2019   Murmur, cardiac 10/08/2018   DDD (degenerative disc disease), cervical 10/08/2018   OAB (overactive bladder) 04/04/2018   Seasonal allergic rhinitis due to pollen 11/01/2016   Microalbuminuria due to type 2 diabetes mellitus ([place]) 07/27/2016   Varicose veins of both lower extremities 08/06/2015   Varicose veins of lower extremities with ulcer ([place]) 06/16/2015   Internal hemorrhoid, bleeding 08/03/2014   Post traumatic stress disorder (PTSD) 01/29/2014   Spinal stenosis of lumbar region with radiculopathy 10/30/2011    Class: Chronic   Obstructive sleep apnea 11/24/2010   Obesity 11/24/2010   Routine general medical examination at a health care facility 11/24/2010   ADENOCARCINOMA, PROSTATE, GLEASON GRADE 6 12/21/2009  Essential hypertension 03/16/2007   GERD 03/16/2007    ONSET DATE: 04/07/22  REFERRING DIAG: Z86.73 (ICD-10-CM) - Hx of completed stroke  THERAPY DIAG:  Muscle weakness (generalized)  Unsteadiness on feet  Other abnormalities of gait and mobility  Rationale for Evaluation and Treatment: Rehabilitation  SUBJECTIVE:                                                                                                                                                                                             SUBJECTIVE STATEMENT: He took all medicines this morning and denies falls or pain. Pt accompanied by: significant other [place])  PERTINENT HISTORY: EMRON RECKNER is a 77 y.o. R-handed male with history of DM2 with diabetic polyneuropathy, OSA on nightly CPAP, PTSD, history of lumbar stenosis with chronic back pain, metastatic renal carcinoma with lymphadenopathy and questionable hepatic involvement noted in 2022, not on chemo due to poor tolerance to the medications, and without significant progression since May 2023, only on surveillance imaging, stage IV, eczema, vitamin D deficiency, CKD stage V refusing HD,  BPH, anemia of CKD  Zinc deficiency, chronic heart failure, mild cognitive impairment, presenting for evaluation of left PCA territory acute CVA.  PAIN:  Are you having pain? No  PRECAUTIONS: Fall  WEIGHT BEARING RESTRICTIONS: No  OBJECTIVE:  TODAY'S TREATMENT:     Vitals (LUE in sitting prior to activity):   163/92 Following gait outdoors:  184/93, following 3 mins rest was 144/85 and at end of session was 147/86  Gait speed with cane: 1.98 ft/sec  Gait speed without cane: 2.67 ft/sec   Gait outdoors without cane:   Pt ambulated x 350' over unlevel paved surfaces at [facility] to S level.  No overt LOB noted but does need cuing to maintain straight path as he tends to veer R.  Then had him ambulate in grass x 200' again without cane and he actually demonstrates improved gait speed and stride length in grass vs on paved surface.  When we returned to paved surface to return to clinic.  Note marked fatigue and some SOB.   Reports fatigue is 7/10.  Marked increase in Trendelenburg gait.  Continue to recommend use of cane outdoors but educated to continue to focus on larger stride length at all times for improved safety and efficiency.   NMR:  In // bars standing on small rocker board biased vertically with feet apart EO maintaining balance x 2 reps of 20 secs>rocking forward/backwards within limits of stability without UEs as much as possible.  Demonstrates very little range before needing to recover with use of UEs initially but does  improve slightly within session. Progressed to maintaining balance with alt UE flexion x 10 reps, adding in opposite head motions x 10 reps.  Single LE in stance on rocker board and moving opposite LE from floor to tapping chair seat in front of him x 10 reps on each side.  Needed single UE support for most of the task.  Ended with standing on foam airex with feet together holding balance x 2 sets of 20 secs, then alt forward step for more stepping strategy x 20 reps.   Intermittent UE support as needed.     PATIENT EDUCATION: Education details: Continue HEP. Person educated: Patient and Spouse Education method: Explanation, Demonstration, and Verbal cues Education comprehension: verbalized understanding and needs further education  HOME EXERCISE PROGRAM:  Access Code: [email or web address] URL: [email or web address] Date: 06/29/2022 Prepared by: Elease Etienne  Exercises - Corner Balance Feet Together With Eyes Closed  - 1 x daily - 5 x weekly - 1 sets - 2-3 reps - 30-45 seconds hold - Supine Bridge  - 1 x daily - 4-5 x weekly - 2 sets - 10 reps - Sit to Stand with Arms Crossed  - 1 x daily - 5 x weekly - 1-2 sets - 8 reps - Standing Hip Extension with Leg Bent and Support  - 1 x daily - 4-5 x weekly - 1 sets - 10 reps  GOALS: Goals reviewed with patient? Yes  SHORT TERM GOALS: Target date: 05/26/22  Pt will be IND with initial HEP in order to indicate improved functional mobility and dec fall risk. Baseline:  Pt states compliance and no issues (1/18) Goal status: MET  2.  Pt will improve gait speed to >/=2.62 ft/sec w/ SPC in order to indicate dec fall risk.  Baseline: 2.00 ft/sec; 1.90 ft/sec (1/18) Goal status: NOT MET  3.  Pt will ambulate 66' without at mod I level with only minimal Trendelenburg in order to indicate more independent household ambulation.   Baseline: ongoing compensated right trendelenburg x200' SBA (1/18) Goal status: IN PROGRESS  4.  Pt will improve FGA to >/=18/30 in order to indicate dec fall risk.   Baseline: 12/30; 11/30 (1/23) Goal status: NOT MET  5.  Pt will improve 5TSS to </=30 secs without UE support only in order to indicate dec fall risk and improved functional strength.   Baseline: approx 37 secs with hands on lap and mild anterior LOB; 26.59 sec no UE support or LOB (1/18) Goal status: MET  6.  Pt will negotiate up/down 4 steps with single rail/cane, up/down ramp and curb and ambulate x  500' outdoors over unlevel paved surfaces at S level in order to indicate improved community mobility.   Baseline: 4 stairs right rail, curb and ramp SBA (1/18); pt declines to ambulate outside due to cold weather (1/23) Goal status: IN PROGRESS  UPDATED LONG TERM GOALS: Target date: 07/22/22  Pt will be IND with final HEP in order to indicate improved functional mobility and dec fall risk. Baseline:  Goal status: ONGOING  2.  Pt will improve FGA to >/=23/30 in order to indicate dec fall risk.   Baseline: 13/30 06/22/22 Goal status: ONGOING  3.  Pt will improve 5TSS to </=20 secs without UE support only in order to indicate dec fall risk and improved functional strength.   Baseline: 27.22 with hands on lap then use of arm rests Goal status: ONGOING  4.  Pt will improve gait speed to >/=2.62 ft/sec w/o AD in order  to indicate dec fall risk.  Baseline: 2.38 ft/sec 06/22/22 Goal status: ONGOING   5.  Pt will negotiate up/down 4 steps with single rail, up/down ramp and curb and ambulate x 500' outdoors over unlevel paved surfaces without device at S level in order to indicate improved community mobility.   Baseline:  Goal status: ONGOING   Progress Note Reporting Period 04/27/22 to 07/06/22  See note below for Objective Data and Assessment of Progress/Goals.       ASSESSMENT:  CLINICAL IMPRESSION: Skilled session focused on gait speed assessment for progress note.  He is actually faster without cane in clinic but the issue is endurance as he fatigues quickly and gait deviations become more pronounced.  Would recommend to continue to use cane in community.  Ended session with high level balance to address ankle, hip and stepping strategy.  Pt tolerated well.  BP remained stable throughout.    OBJECTIVE IMPAIRMENTS: Abnormal gait, decreased activity tolerance, decreased balance, decreased endurance, decreased mobility, decreased strength, impaired perceived functional ability, and  postural dysfunction.   ACTIVITY LIMITATIONS: carrying, lifting, bending, standing, squatting, stairs, transfers, and locomotion level  PARTICIPATION LIMITATIONS: meal prep, cleaning, medication management, personal finances, driving, and community activity  PERSONAL FACTORS: 3+ comorbidities: see above  are also affecting patient's functional outcome.   REHAB POTENTIAL: Good  CLINICAL DECISION MAKING: Evolving/moderate complexity  EVALUATION COMPLEXITY: Moderate  PLAN:  PT FREQUENCY: 2x/week  PT DURATION: 4 weeks  PLANNED INTERVENTIONS: Therapeutic exercises, Therapeutic activity, Neuromuscular re-education, Balance training, Gait training, Patient/Family education, Self Care, Stair training, Vestibular training, DME instructions, and Aquatic Therapy  PLAN FOR NEXT SESSION: gait with and without cane, dynamic stability, hip extension, compliant surfaces, hamstring strength, blaze pods-varied surfaces like compliant or obstacle course, hurdles   Cameron Sprang, PT, MPT [facility] [address] [place], [place], [address] Phone: [phone]   Fax:  [phone] 07/06/22, 11:54 AM

## 2022-07-06 NOTE — Therapy (Signed)
OUTPATIENT OCCUPATIONAL THERAPY NEURO TREATMENT  Patient Name: Antonio Adams MRN: [id number] DOB:30-Apr-1946, 77 y.o., male Today's Date: 07/06/2022  PCP: Dr. Sharlet Salina REFERRING PROVIDER: Dr. Sharlet Salina  END OF SESSION:  OT End of Session - 07/06/22 1107     Visit Number 10    Number of Visits 25    Date for OT Re-Evaluation 07/26/22    Authorization Type [employer]    Authorization - Visit Number 10    Authorization - Number of Visits 20    Progress Note Due on Visit 20    OT Start Time 1105    OT Stop Time 1145    OT Time Calculation (min) 40 min    Activity Tolerance Patient tolerated treatment well    Behavior During Therapy WFL for tasks assessed/performed               Past Medical History:  Diagnosis Date   ADENOCARCINOMA, PROSTATE, GLEASON GRADE 6 12/21/2009   prostate cancer   ALLERGIC RHINITIS 03/16/2007   Allergy    Anxiety    Arthritis    back    BACK PAIN WITH RADICULOPATHY 11/23/2008   Cancer of kidney ([place])    partial right kidney removed   CARPAL TUNNEL SYNDROME, BILATERAL 03/16/2007   Chronic back pain    Constipation    takes Senokot daily   Depression    occasionally   DIABETES MELLITUS, TYPE II 03/16/2007   takes Januvia and MEtformin daily   GASTROINTESTINAL HEMORRHAGE, HX OF 03/16/2007   GERD 03/16/2007   takes Omeprazole daily   Glaucoma    mild - no eye drops   Hemorrhoids    History of colon polyps    HYPERLIPIDEMIA 03/16/2007   takes Crestor daily   HYPERTENSION 03/16/2007   takes Diltiazem and Lisinopril daily    Kidney cancer, primary, with metastasis from kidney to other site [facility])    LEG CRAMPS 05/22/2007   Nocturia    Overactive bladder    PARESTHESIA 05/21/2007   Proctitis    PTSD (post-traumatic stress disorder)    wakes up may be dreaming of fighting   Rectal bleeding    Dr.Norins has explained its from the Radiation that he has received   Rectal bleeding    Renal cell carcinoma ([place])    Sleep apnea    uses CPAP  nightly   Stomach cancer ([place])    Urinary frequency    takes Toviaz daily   Past Surgical History:  Procedure Laterality Date   ADRENALECTOMY Right 02/26/2020   Procedure: OPEN RIGHT RENAL CELL METASTATECTOMY AND  ADRENALECTOMY;  Surgeon: [name], MD;  Location: [facility];  Service: Urology;  Laterality: Right;   [place], 2008   repeat surgery; ESI '08   [place]     bilateral   CERVICAL FUSION     COLONOSCOPY     colonosocpy     ESOPHAGOGASTRODUODENOSCOPY     ED   FLEXIBLE SIGMOIDOSCOPY N/A 01/08/2017   Procedure: FLEXIBLE SIGMOIDOSCOPY;  Surgeon: Irene Shipper, MD;  Location: [place];  Service: Endoscopy;  Laterality: N/A;   HOT HEMOSTASIS N/A 01/08/2017   Procedure: HOT HEMOSTASIS (ARGON PLASMA COAGULATION/BICAP);  Surgeon: Irene Shipper, MD;  Location: [facility];  Service: Endoscopy;  Laterality: N/A;   IR RADIOLOGIST EVAL & MGMT  12/17/2019   IR RADIOLOGIST EVAL & MGMT  07/01/2020   IR RADIOLOGIST EVAL & MGMT  08/25/2020   LUMBAR LAMINECTOMY  10/30/2011  Procedure: MICRODISCECTOMY LUMBAR LAMINECTOMY;  Surgeon: Jessy Oto, MD;  Location: [place];  Service: Orthopedics;  Laterality: Right;  Right L4-5 and L5-S1 Microdiscectomy   LUMBAR LAMINECTOMY  06/17/2012   Procedure: MICRODISCECTOMY LUMBAR LAMINECTOMY;  Surgeon: Jessy Oto, MD;  Location: [place];  Service: Orthopedics;  Laterality: N/A;  Right L5-S1 microdiscectomy   OPEN PARTIAL HEPATECTOMY  N/A 02/26/2020   Procedure: OPEN PARTIAL HEPATECTOMY, CHOLECYSTECTOMY;  Surgeon: Stark Klein, MD;  Location: [facility];  Service: General;  Laterality: N/A;   POLYPECTOMY     PROSTATE SURGERY  march 2012   seed implant   RADIOFREQUENCY ABLATION N/A 07/21/2020   Procedure: CT MICROWAVE ABLATION;  Surgeon: Criselda Peaches, MD;  Location: [facility];  Service: Anesthesiology;  Laterality: N/A;   ROBOTIC ASSITED PARTIAL NEPHRECTOMY Right 09/08/2015   Procedure: XI ROBOTIC ASSITED RIGHT PARTIAL NEPHRECTOMY;   Surgeon: [name], MD;  Location: [facility];  Service: Urology;  Laterality: Right;  Clamp on: 1033 Clamp off: 1103 Total Clamp Time: 30 minutes   SHOULDER ARTHROSCOPY WITH SUBACROMIAL DECOMPRESSION Left 01/20/2016   Procedure: LEFT SHOULDER ARTHROSCOPY WITH EXTENSIVE  DEBRIDEMENT, ACROMIOPLASTY;  Surgeon: Ninetta Lights, MD;  Location: [place];  Service: Orthopedics;  Laterality: Left;   SHOULDER SURGERY  LFT   Stress Cardiolite  12/24/2003   negative for ischemia   UPPER ESOPHAGEAL ENDOSCOPIC ULTRASOUND (EUS)  04/18/2016   [facility]   UPPER GASTROINTESTINAL ENDOSCOPY     WOUND EXPLORATION     management of a wound that he sustained in the [place]   Patient Active Problem List   Diagnosis Date Noted   Vaccine for VZV (varicella-zoster virus) 06/12/2022   Hx of completed stroke 04/28/2022   Abnormal brain CT 04/07/2022   Encounter for general adult medical examination with abnormal findings 01/23/2022   Anemia due to zinc deficiency 12/20/2021   Low back pain 12/15/2021   Symptomatic anemia 12/15/2021   Cancer related pain 12/15/2021   Chronic heart failure with preserved ejection fraction (HFpEF) ([place]) 08/12/2021   Tinea cruris 08/12/2021   Anemia due to chronic kidney disease 07/20/2021   CKD (chronic kidney disease) stage 5, GFR less than 15 ml/min (HCC) 05/24/2021   BPH (benign prostatic hyperplasia) 05/24/2021   Diabetic renal disease ([place]) 05/20/2021   Dry eye syndrome of bilateral lacrimal glands 05/20/2021   Senile nuclear sclerosis 05/20/2021   Vitamin D deficiency 05/20/2021   Vitreous degeneration, unspecified eye 05/20/2021   Intrinsic eczema 04/18/2021   Atherosclerosis of aorta ([place]) 12/30/2020   Mixed conductive and sensorineural hearing loss of left ear with restricted hearing of right ear 09/01/2020   Tinnitus of both ears 05/20/2020   Metastatic renal cell carcinoma ([place]) 02/26/2020   Diabetic polyneuropathy associated with type 2  diabetes mellitus ([place]) 12/18/2019   Murmur, cardiac 10/08/2018   DDD (degenerative disc disease), cervical 10/08/2018   OAB (overactive bladder) 04/04/2018   Seasonal allergic rhinitis due to pollen 11/01/2016   Microalbuminuria due to type 2 diabetes mellitus ([place]) 07/27/2016   Varicose veins of both lower extremities 08/06/2015   Varicose veins of lower extremities with ulcer ([place]) 06/16/2015   Internal hemorrhoid, bleeding 08/03/2014   Post traumatic stress disorder (PTSD) 01/29/2014   Spinal stenosis of lumbar region with radiculopathy 10/30/2011    Class: Chronic   Obstructive sleep apnea 11/24/2010   Obesity 11/24/2010   Routine general medical examination at a health care facility 11/24/2010   ADENOCARCINOMA, PROSTATE, GLEASON GRADE 6 12/21/2009  Essential hypertension 03/16/2007   GERD 03/16/2007    ONSET DATE: 04/07/22  REFERRING DIAG: Diagnosis Z86.73 (ICD-10-CM) - Personal history of transient ischemic attack (TIA), and cerebral infarction without residual deficits  THERAPY DIAG:  Visuospatial deficit  Other lack of coordination  Muscle weakness (generalized)  Rationale for Evaluation and Treatment: Rehabilitation  SUBJECTIVE:   SUBJECTIVE STATEMENT: No pain, no falls  Pt accompanied by: significant other fiancee [name]  PERTINENT HISTORY: 77 y.o. R-handed male with history of DM2 with diabetic polyneuropathy, OSA on nightly CPAP, PTSD, history of lumbar stenosis with chronic back pain, metastatic renal carcinoma with lymphadenopathy and questionable hepatic involvement noted in 2022, not on chemo due to poor tolerance to the medications, and without significant progression since May 2023, only on surveillance imaging, stage IV, eczema, vitamin D deficiency, CKD stage V refusing HD, BPH, anemia of CKD  Zinc deficiency, chronic heart failure, mild cognitive impairment, s/p left PCA territory acute CVA on 04/07/22   DIAGNOSTIC FINDINGS:  MRI of the brain showing a  left PCA large infarct and punctate right PCA infarct MRA remarkable for left PCA P3 branch occlusion, ICA siphon atherosclerosis with possible tandem significant stenosis of the right siphon  PRECAUTIONS: Fall, hx of CA  WEIGHT BEARING RESTRICTIONS: No  PAIN:  Are you having pain? No  FALLS: Has patient fallen in last 6 months? Yes. Number of falls 1  LIVING ENVIRONMENT: Lives with: lives alone, fiancee is there most of the time Lives in: House/apartment Stairs: 1 level stairs to enter Has following equipment at home: Single point cane  PLOF: Independent  PATIENT GOALS: to get right  OBJECTIVE:   HAND DOMINANCE: Right  ADLs: Overall ADLs: fall risk in standing Transfers/ambulation related to [place] Eating: mod I Grooming: mod I UB Dressing: mod I  LB Dressing: supervision, increased time required, therapist recommends pt sits for LB dressing due to fall risk Toileting: mod I using cane Bathing: supervision Tub Shower transfers: min A currently standing in shower, no grab bars, using tub shower does not have seat Equipment: none  IADLs: Shopping: has assistance Light housekeeping: fiancee handles Meal Prep: does not cook Community mobility: ambulates with cane, supervision-minguard, unsteady and walks close to items Medication management: fiancee is helping with meds Financial management: fiancee is assisting  Handwriting: 100% legible  MOBILITY STATUS:  ambulates with minguard with cane , some unsteadiness and walks close to walls and doorways   FUNCTIONAL OUTCOME MEASURES: FOTO: TBA  UPPER EXTREMITY ROM:  remaining AROM is WFLS   Active ROM Right eval Left eval  Shoulder flexion 105 105  Shoulder abduction    Shoulder adduction    Shoulder extension    Shoulder internal rotation    Shoulder external rotation    Elbow flexion    Elbow extension -15 -20  Wrist flexion    Wrist extension    Wrist ulnar deviation    Wrist radial deviation     Wrist pronation    Wrist supination    (Blank rows = not tested)  UPPER EXTREMITY MMT:     MMT Right eval Left eval  Shoulder flexion 4-/5 3+/5  Shoulder abduction    Shoulder adduction    Shoulder extension    Shoulder internal rotation    Shoulder external rotation    Middle trapezius    Lower trapezius    Elbow flexion 4+/5 4+/5  Elbow extension 4/5 4/5  Wrist flexion    Wrist extension    Wrist ulnar deviation  Wrist radial deviation    Wrist pronation    Wrist supination    (Blank rows = not tested)  HAND FUNCTION: Grip strength: Right: 46.9 lbs; Left: 40.3 lbs  COORDINATION: 9 Hole Peg test: Right: 44 sec; Left: 40 sec   SENSATION: Light touch: Impaired  for ring and small finger R hand ulnar distribution, reports this was since CA treatment  EDEMA: n/a   COGNITION: Overall cognitive status: Impaired Decreased short term memory, delayed processing and decreased awareness  VISION:  Baseline vision: Wears glasses all the time   VISION ASSESSMENT: Number cancellation task 1.5 M : 7/80 missed, 91% Visual Fields: per hospital records R homonymous hemianopsia  Vision to be further assessed in a functional context  Patient has difficulty with following activities due to following visual impairments: ambulates close to walls and doorways on R side   OBSERVATIONS: Pt with minimal verbalizations and poor awareness of deficits   TODAY'S TREATMENT:      07/04/22 -  BP at beginning of O.T. session BP 147/86  Pt played operation perfection (easy level) for visual scanning, attention, visual discrimination w/ mod initial verbal cueing, and mod difficulty and extra time for processing.  Pt still had 2 errors and required assist and cues to correct.   2nd design (easy level) - pt did overall w/ less cueing, but extra time required and 2 omissions  PATIENT EDUCATION: Education details: review of red theraband HEP, 10 reps each Person educated: Patient  and Spouse Education method: Explanation, Demonstration, min-mod v.c Education comprehension: verbalized understanding, returned demonstration,needs v.c, pt's spouse can assist    HOME EXERCISE PROGRAM: 05/30/22: Visual scanning strategies, coordination HEP 06/01/22: cane HEP   06/06/22: memory strategies 06/20/22: Red theraband HEP   GOALS: Goals reviewed with patient? Yes  SHORT TERM GOALS: Target date: 06/02/22  I with initial HEP  Goal status: MET w/ cues  2.  Pt will perform tub transfers safely mod I using tub bench prn. Baseline: doe not have tub bench yet, currently supervision, min A Goal status: MET  3.  Pt/caregiver will verbalize understanding of compensations for visual deficits  Goal status: MET  4.  Pt will demonstrate improved fine motor coordination  for ADLs with RUE as evidenced by decreasing 9 hole peg test score by 3 secs Baseline: 9 Hole Peg test: Right: 44 sec; Left: 40 sec Goal status:  met 39.09 secs  07/04/22  5.   Pt will navigate a busy environment and locate items with 75% accuracy without bumping into items. Baseline: nearly bumps into wall and doorway on right side Goal status: IN PROGRESS (56% accuracy)   6.  Pt will verbalize understanding of compensations for short term memory deficits. Baseline:  Goal status: PARTIALLY MET - issued and reviewed x2, however pt still has a hard time recalling strategies   LONG TERM GOALS: Target date: 07/25/21  I with updated HEP Baseline:  Goal status: INITIAL  2.  Pt will navigate a busy environment and locate items with 90% accuracy. Baseline: nearly bumps into wall and doorway on right side Goal status:  ongoing   3.  Pt will perform all basic ADLs mod I with good safety awareness. Baseline: supervision, min A Goal status: MET mod I  4.  Pt will demonstrate improved fine motor coordination with RUE  for ADLs as evidenced by decreasing 9 hole peg test score by 6 secs form initial eval. Baseline: 9  Hole Peg test: Right: 44 sec; Left: 40 sec  Goal status: in progress, 39.09 secs  07/04/22  5.  Pt will demonstrate ability to retrieve lightweight object at 110* shoulder flexion and -10 elbow extension with RUE Baseline: shoulder flexion 105, elbow extension -20 Goal status: MET  6.  Pt will demonstrate ability to retrieve lightweight object at 110* shoulder flexion and -15 elbow extension with LUE Baseline: shoulder flexion 105, elbow extension -20 Goal status: MET  ASSESSMENT:  CLINICAL IMPRESSION: This 10th progress note is for dates: 05/03/22 - 07/06/22 - pt has met most STG's and some LTG's. Pt has improved in ADL status, BUE shoulder ROM and coordination, however remains limited in visual/perceptual and cognitive skills. Pt benefits from continued O.T. to address these deficits, but anticipate d/c by end of current POC  PERFORMANCE DEFICITS: in functional skills including ADLs, IADLs, coordination, dexterity, proprioception, sensation, tone, ROM, strength, flexibility, Fine motor control, Gross motor control, mobility, balance, endurance, decreased knowledge of precautions, decreased knowledge of use of DME, vision, and UE functional use, cognitive skills including energy/drive, learn, memory, problem solving, safety awareness, thought, and understand, and psychosocial skills including coping strategies, environmental adaptation, habits, interpersonal interactions, and routines and behaviors.   IMPAIRMENTS: are limiting patient from ADLs, IADLs, rest and sleep, play, leisure, and social participation.   CO-MORBIDITIES: may have co-morbidities  that affects occupational performance. Patient will benefit from skilled OT to address above impairments and improve overall function.  MODIFICATION OR ASSISTANCE TO COMPLETE EVALUATION: No modification of tasks or assist necessary to complete an evaluation.  OT OCCUPATIONAL PROFILE AND HISTORY: Detailed assessment: Review of records and additional  review of physical, cognitive, psychosocial history related to current functional performance.  CLINICAL DECISION MAKING: LOW - limited treatment options, no task modification necessary  REHAB POTENTIAL: Good  EVALUATION COMPLEXITY: Low   PLAN:  OT FREQUENCY: 2x/week  OT DURATION: 12 weeks  PLANNED INTERVENTIONS: self care/ADL training, therapeutic exercise, therapeutic activity, neuromuscular re-education, manual therapy, passive range of motion, gait training, balance training, stair training, functional mobility training, aquatic therapy, splinting, paraffin, fluidotherapy, moist heat, cryotherapy, contrast bath, patient/family education, cognitive remediation/compensation, visual/perceptual remediation/compensation, energy conservation, coping strategies training, DME and/or AE instructions, and Re-evaluation  RECOMMENDED OTHER SERVICES: PT, ST  CONSULTED AND AGREED WITH PLAN OF CARE: Patient and family member/caregiver  PLAN FOR NEXT SESSION:  continue to work on attention, visual scanning, coordination, environmental scanning (anticipate d/c by 07/25/22)    Hans Eden, OTR/L 07/06/2022, 11:08 AM

## 2022-07-10 ENCOUNTER — Ambulatory Visit (HOSPITAL_COMMUNITY)
Admission: RE | Admit: 2022-07-10 | Discharge: 2022-07-10 | Disposition: A | Discharge status: Home / Self Care | Payer: Medicare Other | Source: Ambulatory Visit | Attending: Nephrology | Admitting: Nephrology

## 2022-07-10 VITALS — BP 169/101 | HR 81 | Temp 97.3°F | Resp 18

## 2022-07-10 DIAGNOSIS — N185 Chronic kidney disease, stage 5: Secondary | ICD-10-CM | POA: Insufficient documentation

## 2022-07-10 LAB — POCT HEMOGLOBIN-HEMACUE: Hemoglobin: 10 g/dL — ABNORMAL LOW (ref 13.0–17.0)

## 2022-07-10 LAB — IRON AND TIBC
Iron: 81 ug/dL (ref 45–182)
Saturation Ratios: 32 % (ref 17.9–39.5)
TIBC: 252 ug/dL (ref 250–450)
UIBC: 171 ug/dL

## 2022-07-10 LAB — FERRITIN: Ferritin: 656 ng/mL — ABNORMAL HIGH (ref 24–336)

## 2022-07-10 MED ORDER — EPOETIN ALFA-EPBX 40000 UNIT/ML IJ SOLN
INTRAMUSCULAR | Status: AC
  Filled 2022-07-10: qty 1

## 2022-07-10 MED ORDER — EPOETIN ALFA-EPBX 40000 UNIT/ML IJ SOLN
30000.0000 [IU] | INTRAMUSCULAR | Status: DC
  Administered 2022-07-10: 30000 [IU] via SUBCUTANEOUS

## 2022-07-11 ENCOUNTER — Ambulatory Visit: Payer: Medicare Other | Admitting: Physical Therapy

## 2022-07-11 ENCOUNTER — Encounter: Payer: Self-pay | Admitting: Occupational Therapy

## 2022-07-11 ENCOUNTER — Ambulatory Visit: Payer: Medicare Other | Admitting: Occupational Therapy

## 2022-07-11 ENCOUNTER — Encounter: Payer: Self-pay | Admitting: Physical Therapy

## 2022-07-11 ENCOUNTER — Encounter: Payer: Medicare Other | Admitting: Speech Pathology

## 2022-07-11 VITALS — BP 158/90 | HR 69

## 2022-07-11 DIAGNOSIS — R2681 Unsteadiness on feet: Secondary | ICD-10-CM

## 2022-07-11 DIAGNOSIS — R278 Other lack of coordination: Secondary | ICD-10-CM

## 2022-07-11 DIAGNOSIS — R41840 Attention and concentration deficit: Secondary | ICD-10-CM

## 2022-07-11 DIAGNOSIS — M6281 Muscle weakness (generalized): Secondary | ICD-10-CM

## 2022-07-11 DIAGNOSIS — R41844 Frontal lobe and executive function deficit: Secondary | ICD-10-CM

## 2022-07-11 DIAGNOSIS — R41842 Visuospatial deficit: Secondary | ICD-10-CM

## 2022-07-11 DIAGNOSIS — R2689 Other abnormalities of gait and mobility: Secondary | ICD-10-CM

## 2022-07-11 NOTE — Therapy (Signed)
OUTPATIENT OCCUPATIONAL THERAPY NEURO TREATMENT  Patient Name: Antonio Adams MRN: [id number] DOB:Apr 18, 1946, 77 y.o., male Today's Date: 07/11/2022  PCP: Dr. Sharlet Salina REFERRING PROVIDER: Dr. Sharlet Salina  END OF SESSION:  OT End of Session - 07/11/22 0946     Visit Number 11    Number of Visits 25    Date for OT Re-Evaluation 07/26/22    Authorization Type [employer] Medicare    Authorization - Visit Number 11    Authorization - Number of Visits 20    Progress Note Due on Visit 20    OT Start Time 0943   Pt arrived 10 min late   OT Stop Time 1015    OT Time Calculation (min) 32 min    Activity Tolerance Patient tolerated treatment well    Behavior During Therapy WFL for tasks assessed/performed               Past Medical History:  Diagnosis Date   ADENOCARCINOMA, PROSTATE, GLEASON GRADE 6 12/21/2009   prostate cancer   ALLERGIC RHINITIS 03/16/2007   Allergy    Anxiety    Arthritis    back    BACK PAIN WITH RADICULOPATHY 11/23/2008   Cancer of kidney ([place])    partial right kidney removed   CARPAL TUNNEL SYNDROME, BILATERAL 03/16/2007   Chronic back pain    Constipation    takes Senokot daily   Depression    occasionally   DIABETES MELLITUS, TYPE II 03/16/2007   takes Januvia and MEtformin daily   GASTROINTESTINAL HEMORRHAGE, HX OF 03/16/2007   GERD 03/16/2007   takes Omeprazole daily   Glaucoma    mild - no eye drops   Hemorrhoids    History of colon polyps    HYPERLIPIDEMIA 03/16/2007   takes Crestor daily   HYPERTENSION 03/16/2007   takes Diltiazem and Lisinopril daily    Kidney cancer, primary, with metastasis from kidney to other site [facility])    LEG CRAMPS 05/22/2007   Nocturia    Overactive bladder    PARESTHESIA 05/21/2007   Proctitis    PTSD (post-traumatic stress disorder)    wakes up may be dreaming of fighting   Rectal bleeding    Dr.Norins has explained its from the Radiation that he has received   Rectal bleeding    Renal cell carcinoma ([place])     Sleep apnea    uses CPAP nightly   Stomach cancer ([place])    Urinary frequency    takes Toviaz daily   Past Surgical History:  Procedure Laterality Date   ADRENALECTOMY Right 02/26/2020   Procedure: OPEN RIGHT RENAL CELL METASTATECTOMY AND  ADRENALECTOMY;  Surgeon: [name], MD;  Location: [facility];  Service: Urology;  Laterality: Right;   [place], 2008   repeat surgery; ESI '08   [place]     bilateral   CERVICAL FUSION     COLONOSCOPY     colonosocpy     ESOPHAGOGASTRODUODENOSCOPY     ED   FLEXIBLE SIGMOIDOSCOPY N/A 01/08/2017   Procedure: FLEXIBLE SIGMOIDOSCOPY;  Surgeon: Irene Shipper, MD;  Location: [place];  Service: Endoscopy;  Laterality: N/A;   HOT HEMOSTASIS N/A 01/08/2017   Procedure: HOT HEMOSTASIS (ARGON PLASMA COAGULATION/BICAP);  Surgeon: Irene Shipper, MD;  Location: [facility]COPY;  Service: Endoscopy;  Laterality: N/A;   IR RADIOLOGIST EVAL & MGMT  12/17/2019   IR RADIOLOGIST EVAL & MGMT  07/01/2020   IR RADIOLOGIST EVAL & MGMT  08/25/2020  LUMBAR LAMINECTOMY  10/30/2011   Procedure: MICRODISCECTOMY LUMBAR LAMINECTOMY;  Surgeon: Jessy Oto, MD;  Location: [place];  Service: Orthopedics;  Laterality: Right;  Right L4-5 and L5-S1 Microdiscectomy   LUMBAR LAMINECTOMY  06/17/2012   Procedure: MICRODISCECTOMY LUMBAR LAMINECTOMY;  Surgeon: Jessy Oto, MD;  Location: [place];  Service: Orthopedics;  Laterality: N/A;  Right L5-S1 microdiscectomy   OPEN PARTIAL HEPATECTOMY  N/A 02/26/2020   Procedure: OPEN PARTIAL HEPATECTOMY, CHOLECYSTECTOMY;  Surgeon: Stark Klein, MD;  Location: [facility];  Service: General;  Laterality: N/A;   POLYPECTOMY     PROSTATE SURGERY  march 2012   seed implant   RADIOFREQUENCY ABLATION N/A 07/21/2020   Procedure: CT MICROWAVE ABLATION;  Surgeon: Criselda Peaches, MD;  Location: [facility];  Service: Anesthesiology;  Laterality: N/A;   ROBOTIC ASSITED PARTIAL NEPHRECTOMY Right 09/08/2015   Procedure: XI ROBOTIC ASSITED  RIGHT PARTIAL NEPHRECTOMY;  Surgeon: [name], MD;  Location: [facility];  Service: Urology;  Laterality: Right;  Clamp on: 1033 Clamp off: 1103 Total Clamp Time: 30 minutes   SHOULDER ARTHROSCOPY WITH SUBACROMIAL DECOMPRESSION Left 01/20/2016   Procedure: LEFT SHOULDER ARTHROSCOPY WITH EXTENSIVE  DEBRIDEMENT, ACROMIOPLASTY;  Surgeon: Ninetta Lights, MD;  Location: [place];  Service: Orthopedics;  Laterality: Left;   SHOULDER SURGERY  LFT   Stress Cardiolite  12/24/2003   negative for ischemia   UPPER ESOPHAGEAL ENDOSCOPIC ULTRASOUND (EUS)  04/18/2016   [facility]   UPPER GASTROINTESTINAL ENDOSCOPY     WOUND EXPLORATION     management of a wound that he sustained in the [place]   Patient Active Problem List   Diagnosis Date Noted   Vaccine for VZV (varicella-zoster virus) 06/12/2022   Hx of completed stroke 04/28/2022   Abnormal brain CT 04/07/2022   Encounter for general adult medical examination with abnormal findings 01/23/2022   Anemia due to zinc deficiency 12/20/2021   Low back pain 12/15/2021   Symptomatic anemia 12/15/2021   Cancer related pain 12/15/2021   Chronic heart failure with preserved ejection fraction (HFpEF) ([place]) 08/12/2021   Tinea cruris 08/12/2021   Anemia due to chronic kidney disease 07/20/2021   CKD (chronic kidney disease) stage 5, GFR less than 15 ml/min (HCC) 05/24/2021   BPH (benign prostatic hyperplasia) 05/24/2021   Diabetic renal disease ([place]) 05/20/2021   Dry eye syndrome of bilateral lacrimal glands 05/20/2021   Senile nuclear sclerosis 05/20/2021   Vitamin D deficiency 05/20/2021   Vitreous degeneration, unspecified eye 05/20/2021   Intrinsic eczema 04/18/2021   Atherosclerosis of aorta ([place]) 12/30/2020   Mixed conductive and sensorineural hearing loss of left ear with restricted hearing of right ear 09/01/2020   Tinnitus of both ears 05/20/2020   Metastatic renal cell carcinoma ([place]) 02/26/2020   Diabetic  polyneuropathy associated with type 2 diabetes mellitus ([place]) 12/18/2019   Murmur, cardiac 10/08/2018   DDD (degenerative disc disease), cervical 10/08/2018   OAB (overactive bladder) 04/04/2018   Seasonal allergic rhinitis due to pollen 11/01/2016   Microalbuminuria due to type 2 diabetes mellitus ([place]) 07/27/2016   Varicose veins of both lower extremities 08/06/2015   Varicose veins of lower extremities with ulcer ([place]) 06/16/2015   Internal hemorrhoid, bleeding 08/03/2014   Post traumatic stress disorder (PTSD) 01/29/2014   Spinal stenosis of lumbar region with radiculopathy 10/30/2011    Class: Chronic   Obstructive sleep apnea 11/24/2010   Obesity 11/24/2010   Routine general medical examination at a health care facility 11/24/2010  ADENOCARCINOMA, PROSTATE, GLEASON GRADE 6 12/21/2009   Essential hypertension 03/16/2007   GERD 03/16/2007    ONSET DATE: 04/07/22  REFERRING DIAG: Diagnosis Z86.73 (ICD-10-CM) - Personal history of transient ischemic attack (TIA), and cerebral infarction without residual deficits  THERAPY DIAG:  Visuospatial deficit  Other lack of coordination  Attention and concentration deficit  Frontal lobe and executive function deficit  Unsteadiness on feet  Rationale for Evaluation and Treatment: Rehabilitation  SUBJECTIVE:   SUBJECTIVE STATEMENT: No pain, no falls. No changes since last week  Pt accompanied by: significant other fiancee [name]  PERTINENT HISTORY: 77 y.o. R-handed male with history of DM2 with diabetic polyneuropathy, OSA on nightly CPAP, PTSD, history of lumbar stenosis with chronic back pain, metastatic renal carcinoma with lymphadenopathy and questionable hepatic involvement noted in 2022, not on chemo due to poor tolerance to the medications, and without significant progression since May 2023, only on surveillance imaging, stage IV, eczema, vitamin D deficiency, CKD stage V refusing HD, BPH, anemia of CKD  Zinc deficiency,  chronic heart failure, mild cognitive impairment, s/p left PCA territory acute CVA on 04/07/22   DIAGNOSTIC FINDINGS:  MRI of the brain showing a left PCA large infarct and punctate right PCA infarct MRA remarkable for left PCA P3 branch occlusion, ICA siphon atherosclerosis with possible tandem significant stenosis of the right siphon  PRECAUTIONS: Fall, hx of CA  WEIGHT BEARING RESTRICTIONS: No  PAIN:  Are you having pain? No  FALLS: Has patient fallen in last 6 months? Yes. Number of falls 1  LIVING ENVIRONMENT: Lives with: lives alone, fiancee is there most of the time Lives in: House/apartment Stairs: 1 level stairs to enter Has following equipment at home: Single point cane  PLOF: Independent  PATIENT GOALS: to get right  OBJECTIVE:   HAND DOMINANCE: Right  ADLs: Overall ADLs: fall risk in standing Transfers/ambulation related to [place] Eating: mod I Grooming: mod I UB Dressing: mod I  LB Dressing: supervision, increased time required, therapist recommends pt sits for LB dressing due to fall risk Toileting: mod I using cane Bathing: supervision Tub Shower transfers: min A currently standing in shower, no grab bars, using tub shower does not have seat Equipment: none  IADLs: Shopping: has assistance Light housekeeping: fiancee handles Meal Prep: does not cook Community mobility: ambulates with cane, supervision-minguard, unsteady and walks close to items Medication management: fiancee is helping with meds Financial management: fiancee is assisting  Handwriting: 100% legible  MOBILITY STATUS:  ambulates with minguard with cane , some unsteadiness and walks close to walls and doorways   FUNCTIONAL OUTCOME MEASURES: FOTO: TBA  UPPER EXTREMITY ROM:  remaining AROM is WFLS   Active ROM Right eval Left eval  Shoulder flexion 105 105  Shoulder abduction    Shoulder adduction    Shoulder extension    Shoulder internal rotation    Shoulder  external rotation    Elbow flexion    Elbow extension -15 -20  Wrist flexion    Wrist extension    Wrist ulnar deviation    Wrist radial deviation    Wrist pronation    Wrist supination    (Blank rows = not tested)  UPPER EXTREMITY MMT:     MMT Right eval Left eval  Shoulder flexion 4-/5 3+/5  Shoulder abduction    Shoulder adduction    Shoulder extension    Shoulder internal rotation    Shoulder external rotation    Middle trapezius    Lower trapezius  Elbow flexion 4+/5 4+/5  Elbow extension 4/5 4/5  Wrist flexion    Wrist extension    Wrist ulnar deviation    Wrist radial deviation    Wrist pronation    Wrist supination    (Blank rows = not tested)  HAND FUNCTION: Grip strength: Right: 46.9 lbs; Left: 40.3 lbs  COORDINATION: 9 Hole Peg test: Right: 44 sec; Left: 40 sec   SENSATION: Light touch: Impaired  for ring and small finger R hand ulnar distribution, reports this was since CA treatment  EDEMA: n/a   COGNITION: Overall cognitive status: Impaired Decreased short term memory, delayed processing and decreased awareness  VISION:  Baseline vision: Wears glasses all the time   VISION ASSESSMENT: Number cancellation task 1.5 M : 7/80 missed, 91% Visual Fields: per hospital records R homonymous hemianopsia  Vision to be further assessed in a functional context  Patient has difficulty with following activities due to following visual impairments: ambulates close to walls and doorways on R side   OBSERVATIONS: Pt with minimal verbalizations and poor awareness of deficits   TODAY'S TREATMENT:      Pt copying peg design for attention, visual scanning, and coordination Rt hand with extra time, mod cues on correct place to start, and min errors. Questioning cues to correct errors Environmental scanning finding 16/16 on first pass (initially missed 1 on lower left but turned around and then found it)     PATIENT EDUCATION: Education details: review  of red theraband HEP, 10 reps each Person educated: Patient and Spouse Education method: Explanation, Demonstration, min-mod v.c Education comprehension: verbalized understanding, returned demonstration,needs v.c, pt's spouse can assist    HOME EXERCISE PROGRAM: 05/30/22: Visual scanning strategies, coordination HEP 06/01/22: cane HEP   06/06/22: memory strategies 06/20/22: Red theraband HEP   GOALS: Goals reviewed with patient? Yes  SHORT TERM GOALS: Target date: 06/02/22  I with initial HEP  Goal status: MET w/ cues  2.  Pt will perform tub transfers safely mod I using tub bench prn. Baseline: doe not have tub bench yet, currently supervision, min A Goal status: MET  3.  Pt/caregiver will verbalize understanding of compensations for visual deficits  Goal status: MET  4.  Pt will demonstrate improved fine motor coordination  for ADLs with RUE as evidenced by decreasing 9 hole peg test score by 3 secs Baseline: 9 Hole Peg test: Right: 44 sec; Left: 40 sec Goal status:  met 39.09 secs  07/04/22  5.   Pt will navigate a busy environment and locate items with 75% accuracy without bumping into items. Baseline: nearly bumps into wall and doorway on right side Goal status: IN PROGRESS (56% accuracy)   6.  Pt will verbalize understanding of compensations for short term memory deficits. Baseline:  Goal status: PARTIALLY MET - issued and reviewed x2, however pt still has a hard time recalling strategies   LONG TERM GOALS: Target date: 07/25/21  I with updated HEP Baseline:  Goal status: INITIAL  2.  Pt will navigate a busy environment and locate items with 90% accuracy. Baseline: nearly bumps into wall and doorway on right side Goal status:  ongoing   3.  Pt will perform all basic ADLs mod I with good safety awareness. Baseline: supervision, min A Goal status: MET mod I  4.  Pt will demonstrate improved fine motor coordination with RUE  for ADLs as evidenced by decreasing 9  hole peg test score by 6 secs form initial eval. Baseline: 9  Hole Peg test: Right: 44 sec; Left: 40 sec Goal status: in progress, 39.09 secs  07/04/22  5.  Pt will demonstrate ability to retrieve lightweight object at 110* shoulder flexion and -10 elbow extension with RUE Baseline: shoulder flexion 105, elbow extension -20 Goal status: MET  6.  Pt will demonstrate ability to retrieve lightweight object at 110* shoulder flexion and -15 elbow extension with LUE Baseline: shoulder flexion 105, elbow extension -20 Goal status: MET  ASSESSMENT:  CLINICAL IMPRESSION: Pt has improved in ADL status, BUE shoulder ROM and coordination, however remains limited in visual/perceptual and cognitive skills. Pt benefits from continued O.T. to address these deficits, but anticipate d/c by end of current POC  PERFORMANCE DEFICITS: in functional skills including ADLs, IADLs, coordination, dexterity, proprioception, sensation, tone, ROM, strength, flexibility, Fine motor control, Gross motor control, mobility, balance, endurance, decreased knowledge of precautions, decreased knowledge of use of DME, vision, and UE functional use, cognitive skills including energy/drive, learn, memory, problem solving, safety awareness, thought, and understand, and psychosocial skills including coping strategies, environmental adaptation, habits, interpersonal interactions, and routines and behaviors.   IMPAIRMENTS: are limiting patient from ADLs, IADLs, rest and sleep, play, leisure, and social participation.   CO-MORBIDITIES: may have co-morbidities  that affects occupational performance. Patient will benefit from skilled OT to address above impairments and improve overall function.  MODIFICATION OR ASSISTANCE TO COMPLETE EVALUATION: No modification of tasks or assist necessary to complete an evaluation.  OT OCCUPATIONAL PROFILE AND HISTORY: Detailed assessment: Review of records and additional review of physical, cognitive,  psychosocial history related to current functional performance.  CLINICAL DECISION MAKING: LOW - limited treatment options, no task modification necessary  REHAB POTENTIAL: Good  EVALUATION COMPLEXITY: Low   PLAN:  OT FREQUENCY: 2x/week  OT DURATION: 12 weeks  PLANNED INTERVENTIONS: self care/ADL training, therapeutic exercise, therapeutic activity, neuromuscular re-education, manual therapy, passive range of motion, gait training, balance training, stair training, functional mobility training, aquatic therapy, splinting, paraffin, fluidotherapy, moist heat, cryotherapy, contrast bath, patient/family education, cognitive remediation/compensation, visual/perceptual remediation/compensation, energy conservation, coping strategies training, DME and/or AE instructions, and Re-evaluation  RECOMMENDED OTHER SERVICES: PT, ST  CONSULTED AND AGREED WITH PLAN OF CARE: Patient and family member/caregiver  PLAN FOR NEXT SESSION: work on picking up things off floor with countertop support, or w/ reacher in middle of floor, simple word search, environmental scanning in gym    [employer], OTR/L 07/11/2022, 9:47 AM

## 2022-07-11 NOTE — Therapy (Signed)
OUTPATIENT PHYSICAL THERAPY NEURO TREATMENT   Patient Name: Antonio Adams MRN: [id number] DOB:05-27-1945, 77 y.o., male Today's Date: 07/11/2022   PCP: Scarlette Calico, MD REFERRING PROVIDER: Pricilla Holm, MD  END OF SESSION:  PT End of Session - 07/11/22 1013     Visit Number 11    Number of Visits 17    Date for PT Re-Evaluation 07/22/22   updated POC   Authorization Type [employer] Medicare    Progress Note Due on Visit 20    PT Start Time 1015    PT Stop Time 1057    PT Time Calculation (min) 42 min    Equipment Utilized During Treatment Gait belt    Activity Tolerance Patient tolerated treatment well    Behavior During Therapy WFL for tasks assessed/performed             Past Medical History:  Diagnosis Date   ADENOCARCINOMA, PROSTATE, GLEASON GRADE 6 12/21/2009   prostate cancer   ALLERGIC RHINITIS 03/16/2007   Allergy    Anxiety    Arthritis    back    BACK PAIN WITH RADICULOPATHY 11/23/2008   Cancer of kidney ([place])    partial right kidney removed   CARPAL TUNNEL SYNDROME, BILATERAL 03/16/2007   Chronic back pain    Constipation    takes Senokot daily   Depression    occasionally   DIABETES MELLITUS, TYPE II 03/16/2007   takes Januvia and MEtformin daily   GASTROINTESTINAL HEMORRHAGE, HX OF 03/16/2007   GERD 03/16/2007   takes Omeprazole daily   Glaucoma    mild - no eye drops   Hemorrhoids    History of colon polyps    HYPERLIPIDEMIA 03/16/2007   takes Crestor daily   HYPERTENSION 03/16/2007   takes Diltiazem and Lisinopril daily    Kidney cancer, primary, with metastasis from kidney to other site [facility])    LEG CRAMPS 05/22/2007   Nocturia    Overactive bladder    PARESTHESIA 05/21/2007   Proctitis    PTSD (post-traumatic stress disorder)    wakes up may be dreaming of fighting   Rectal bleeding    Dr.Norins has explained its from the Radiation that he has received   Rectal bleeding    Renal cell carcinoma ([place])    Sleep apnea    uses CPAP  nightly   Stomach cancer ([place])    Urinary frequency    takes Toviaz daily   Past Surgical History:  Procedure Laterality Date   ADRENALECTOMY Right 02/26/2020   Procedure: OPEN RIGHT RENAL CELL METASTATECTOMY AND  ADRENALECTOMY;  Surgeon: [name], MD;  Location: [facility];  Service: Urology;  Laterality: Right;   [place], 2008   repeat surgery; ESI '08   [place]     bilateral   CERVICAL FUSION     COLONOSCOPY     colonosocpy     ESOPHAGOGASTRODUODENOSCOPY     ED   FLEXIBLE SIGMOIDOSCOPY N/A 01/08/2017   Procedure: FLEXIBLE SIGMOIDOSCOPY;  Surgeon: Irene Shipper, MD;  Location: [place];  Service: Endoscopy;  Laterality: N/A;   HOT HEMOSTASIS N/A 01/08/2017   Procedure: HOT HEMOSTASIS (ARGON PLASMA COAGULATION/BICAP);  Surgeon: Irene Shipper, MD;  [facility];  Service: Endoscopy;  Laterality: N/A;   IR RADIOLOGIST EVAL & MGMT  12/17/2019   IR RADIOLOGIST EVAL & MGMT  07/01/2020   IR RADIOLOGIST EVAL & MGMT  08/25/2020   LUMBAR LAMINECTOMY  10/30/2011   Procedure: MICRODISCECTOMY  LUMBAR LAMINECTOMY;  Surgeon: Jessy Oto, MD;  Location: [place];  Service: Orthopedics;  Laterality: Right;  Right L4-5 and L5-S1 Microdiscectomy   LUMBAR LAMINECTOMY  06/17/2012   Procedure: MICRODISCECTOMY LUMBAR LAMINECTOMY;  Surgeon: Jessy Oto, MD;  Location: [place];  Service: Orthopedics;  Laterality: N/A;  Right L5-S1 microdiscectomy   OPEN PARTIAL HEPATECTOMY  N/A 02/26/2020   Procedure: OPEN PARTIAL HEPATECTOMY, CHOLECYSTECTOMY;  Surgeon: Stark Klein, MD;  Location: [facility];  Service: General;  Laterality: N/A;   POLYPECTOMY     PROSTATE SURGERY  march 2012   seed implant   RADIOFREQUENCY ABLATION N/A 07/21/2020   Procedure: CT MICROWAVE ABLATION;  Surgeon: Criselda Peaches, MD;  Location: [facility];  Service: Anesthesiology;  Laterality: N/A;   ROBOTIC ASSITED PARTIAL NEPHRECTOMY Right 09/08/2015   Procedure: XI ROBOTIC ASSITED RIGHT PARTIAL NEPHRECTOMY;   Surgeon: [name], MD;  Location[facility];  Service: Urology;  Laterality: Right;  Clamp on: 1033 Clamp off: 1103 Total Clamp Time: 30 minutes   SHOULDER ARTHROSCOPY WITH SUBACROMIAL DECOMPRESSION Left 01/20/2016   Procedure: LEFT SHOULDER ARTHROSCOPY WITH EXTENSIVE  DEBRIDEMENT, ACROMIOPLASTY;  Surgeon: Ninetta Lights, MD;  Location: [place];  Service: Orthopedics;  Laterality: Left;   SHOULDER SURGERY  LFT   Stress Cardiolite  12/24/2003   negative for ischemia   UPPER ESOPHAGEAL ENDOSCOPIC ULTRASOUND (EUS)  04/18/2016   [facility]   UPPER GASTROINTESTINAL ENDOSCOPY     WOUND EXPLORATION     management of a wound that he sustained in the [place]   Patient Active Problem List   Diagnosis Date Noted   Vaccine for VZV (varicella-zoster virus) 06/12/2022   Hx of completed stroke 04/28/2022   Abnormal brain CT 04/07/2022   Encounter for general adult medical examination with abnormal findings 01/23/2022   Anemia due to zinc deficiency 12/20/2021   Low back pain 12/15/2021   Symptomatic anemia 12/15/2021   Cancer related pain 12/15/2021   Chronic heart failure with preserved ejection fraction (HFpEF) ([place]) 08/12/2021   Tinea cruris 08/12/2021   Anemia due to chronic kidney disease 07/20/2021   CKD (chronic kidney disease) stage 5, GFR less than 15 ml/min (HCC) 05/24/2021   BPH (benign prostatic hyperplasia) 05/24/2021   Diabetic renal disease ([place]) 05/20/2021   Dry eye syndrome of bilateral lacrimal glands 05/20/2021   Senile nuclear sclerosis 05/20/2021   Vitamin D deficiency 05/20/2021   Vitreous degeneration, unspecified eye 05/20/2021   Intrinsic eczema 04/18/2021   Atherosclerosis of aorta ([place]) 12/30/2020   Mixed conductive and sensorineural hearing loss of left ear with restricted hearing of right ear 09/01/2020   Tinnitus of both ears 05/20/2020   Metastatic renal cell carcinoma ([place]) 02/26/2020   Diabetic polyneuropathy associated with type 2  diabetes mellitus ([place]) 12/18/2019   Murmur, cardiac 10/08/2018   DDD (degenerative disc disease), cervical 10/08/2018   OAB (overactive bladder) 04/04/2018   Seasonal allergic rhinitis due to pollen 11/01/2016   Microalbuminuria due to type 2 diabetes mellitus ([place]) 07/27/2016   Varicose veins of both lower extremities 08/06/2015   Varicose veins of lower extremities with ulcer ([place]) 06/16/2015   Internal hemorrhoid, bleeding 08/03/2014   Post traumatic stress disorder (PTSD) 01/29/2014   Spinal stenosis of lumbar region with radiculopathy 10/30/2011    Class: Chronic   Obstructive sleep apnea 11/24/2010   Obesity 11/24/2010   Routine general medical examination at a health care facility 11/24/2010   ADENOCARCINOMA, PROSTATE, GLEASON GRADE 6 12/21/2009  Essential hypertension 03/16/2007   GERD 03/16/2007    ONSET DATE: 04/07/22  REFERRING DIAG: Z86.73 (ICD-10-CM) - Hx of completed stroke  THERAPY DIAG:  Other lack of coordination  Muscle weakness (generalized)  Unsteadiness on feet  Other abnormalities of gait and mobility  Rationale for Evaluation and Treatment: Rehabilitation  SUBJECTIVE:                                                                                                                                                                                             SUBJECTIVE STATEMENT: He took all of his medicines this morning and states he feels fine.  He denies falls or acute changes and further denies pain at this time. Pt accompanied by: significant other [place])  PERTINENT HISTORY: Antonio Adams is a 77 y.o. R-handed male with history of DM2 with diabetic polyneuropathy, OSA on nightly CPAP, PTSD, history of lumbar stenosis with chronic back pain, metastatic renal carcinoma with lymphadenopathy and questionable hepatic involvement noted in 2022, not on chemo due to poor tolerance to the medications, and without significant progression since May 2023,  only on surveillance imaging, stage IV, eczema, vitamin D deficiency, CKD stage V refusing HD, BPH, anemia of CKD  Zinc deficiency, chronic heart failure, mild cognitive impairment, presenting for evaluation of left PCA territory acute CVA.  PAIN:  Are you having pain? No  PRECAUTIONS: Fall  WEIGHT BEARING RESTRICTIONS: No  OBJECTIVE:  TODAY'S TREATMENT:     Vitals (LUE in sitting prior to activity):   Today's Vitals   07/11/22 1022  BP: (!) 158/90  Pulse: 69   -8" hurdles performed w/ BUE support w/ regression from reciprocal step over pattern to step-to pattern due to increased circumduction and bumping of hurdles w/ reciprocal pattern, several rounds performed -Standing 3-way hip 2x6 each side at counter -Standing resisted marching x20 w/ green theraband at ankles -Standing resisted hamstring curls w/ green theraband at ankles x16 each LE -SLS w/ BUE support progressed to 2 fingertip support 2x20 sec each LE -Toe taps up and down stairs to 3rd step w/ BUE support for dynamic SLS and cues for improved foot clearance on return taps -Blaze pods on blue mat w/ pt scanning left and right at floor level to perform toe taps while ambulating forward and backwards, 3 rounds x1 minute each w/ 20 seconds of rest between, pt requires significant cuing during initial 2 rounds for direction of stepping, safety w/ SLS, and scanning; 4, 8, 14 taps achieved with each round.  PATIENT EDUCATION: Education details: Continue HEP.  Canceling visit outside current cert period and plan for upcoming discharge.  Person educated: Patient and Spouse Education method: Explanation, Demonstration, and Verbal cues Education comprehension: verbalized understanding and needs further education  HOME EXERCISE PROGRAM:  Access Code: [email or web address] URL: [email or web address] Date: 06/29/2022 Prepared by: Elease Etienne  Exercises - Corner Balance Feet Together With Eyes Closed  - 1 x daily - 5 x weekly  - 1 sets - 2-3 reps - 30-45 seconds hold - Supine Bridge  - 1 x daily - 4-5 x weekly - 2 sets - 10 reps - Sit to Stand with Arms Crossed  - 1 x daily - 5 x weekly - 1-2 sets - 8 reps - Standing Hip Extension with Leg Bent and Support  - 1 x daily - 4-5 x weekly - 1 sets - 10 reps  GOALS: Goals reviewed with patient? Yes  SHORT TERM GOALS: Target date: 05/26/22  Pt will be IND with initial HEP in order to indicate improved functional mobility and dec fall risk. Baseline:  Pt states compliance and no issues (1/18) Goal status: MET  2.  Pt will improve gait speed to >/=2.62 ft/sec w/ SPC in order to indicate dec fall risk.  Baseline: 2.00 ft/sec; 1.90 ft/sec (1/18) Goal status: NOT MET  3.  Pt will ambulate 44' without at mod I level with only minimal Trendelenburg in order to indicate more independent household ambulation.   Baseline: ongoing compensated right trendelenburg x200' SBA (1/18) Goal status: IN PROGRESS  4.  Pt will improve FGA to >/=18/30 in order to indicate dec fall risk.   Baseline: 12/30; 11/30 (1/23) Goal status: NOT MET  5.  Pt will improve 5TSS to </=30 secs without UE support only in order to indicate dec fall risk and improved functional strength.   Baseline: approx 37 secs with hands on lap and mild anterior LOB; 26.59 sec no UE support or LOB (1/18) Goal status: MET  6.  Pt will negotiate up/down 4 steps with single rail/cane, up/down ramp and curb and ambulate x 500' outdoors over unlevel paved surfaces at S level in order to indicate improved community mobility.   Baseline: 4 stairs right rail, curb and ramp SBA (1/18); pt declines to ambulate outside due to cold weather (1/23) Goal status: IN PROGRESS  UPDATED LONG TERM GOALS: Target date: 07/22/22  Pt will be IND with final HEP in order to indicate improved functional mobility and dec fall risk. Baseline:  Goal status: ONGOING  2.  Pt will improve FGA to >/=23/30 in order to indicate dec fall risk.    Baseline: 13/30 06/22/22 Goal status: ONGOING  3.  Pt will improve 5TSS to </=20 secs without UE support only in order to indicate dec fall risk and improved functional strength.   Baseline: 27.22 with hands on lap then use of arm rests Goal status: ONGOING  4.  Pt will improve gait speed to >/=2.62 ft/sec w/o AD in order to indicate dec fall risk.  Baseline: 2.38 ft/sec 06/22/22 Goal status: ONGOING   5.  Pt will negotiate up/down 4 steps with single rail, up/down ramp and curb and ambulate x 500' outdoors over unlevel paved surfaces without device at S level in order to indicate improved community mobility.   Baseline:  Goal status: ONGOING   ASSESSMENT:  CLINICAL IMPRESSION: Patient more distractible with increased difficulty scanning environment this session resulting in bumping into obstacles during ambulatory tasks despite prompting.  Continued focus on hip strengthening, dynamic stability, and SLS tasks to target prominent trendelenburg.  Pt is appropriate for and in  agreement to plan to discharge in coming visits with continuation of skilled PT POC to address ongoing deficits prior to this.  OBJECTIVE IMPAIRMENTS: Abnormal gait, decreased activity tolerance, decreased balance, decreased endurance, decreased mobility, decreased strength, impaired perceived functional ability, and postural dysfunction.   ACTIVITY LIMITATIONS: carrying, lifting, bending, standing, squatting, stairs, transfers, and locomotion level  PARTICIPATION LIMITATIONS: meal prep, cleaning, medication management, personal finances, driving, and community activity  PERSONAL FACTORS: 3+ comorbidities: see above  are also affecting patient's functional outcome.   REHAB POTENTIAL: Good  CLINICAL DECISION MAKING: Evolving/moderate complexity  EVALUATION COMPLEXITY: Moderate  PLAN:  PT FREQUENCY: 2x/week  PT DURATION: 4 weeks  PLANNED INTERVENTIONS: Therapeutic exercises, Therapeutic activity,  Neuromuscular re-education, Balance training, Gait training, Patient/Family education, Self Care, Stair training, Vestibular training, DME instructions, and Aquatic Therapy  PLAN FOR NEXT SESSION: gait with and without cane, dynamic stability, hip extension, compliant surfaces, hamstring strength, leg press vs squats w/ UE support, STS w/ ball push, additions to HEP?   Elease Etienne, PT, [place] [address] [place], [place], [address] Phone: [phone]   Fax:  [phone] 07/11/22, 11:27 AM

## 2022-07-13 ENCOUNTER — Ambulatory Visit: Payer: Medicare Other | Admitting: Rehabilitation

## 2022-07-13 ENCOUNTER — Encounter: Payer: Self-pay | Admitting: Rehabilitation

## 2022-07-13 DIAGNOSIS — R2689 Other abnormalities of gait and mobility: Secondary | ICD-10-CM | POA: Diagnosis not present

## 2022-07-13 DIAGNOSIS — R278 Other lack of coordination: Secondary | ICD-10-CM | POA: Diagnosis not present

## 2022-07-13 DIAGNOSIS — R2681 Unsteadiness on feet: Secondary | ICD-10-CM | POA: Diagnosis not present

## 2022-07-13 DIAGNOSIS — M6281 Muscle weakness (generalized): Secondary | ICD-10-CM | POA: Diagnosis not present

## 2022-07-13 NOTE — Therapy (Signed)
OUTPATIENT PHYSICAL THERAPY NEURO TREATMENT   Patient Name: Antonio Adams MRN: [id number] DOB:09-23-1945, 77 y.o., male Today's Date: 07/13/2022   PCP: Scarlette Calico, MD REFERRING PROVIDER: Pricilla Holm, MD  END OF SESSION:  PT End of Session - 07/13/22 1020     Visit Number 12    Number of Visits 17    Date for PT Re-Evaluation 07/22/22   updated POC   Authorization Type [employer] Medicare    Progress Note Due on Visit 20    PT Start Time 1015    PT Stop Time 1059    PT Time Calculation (min) 44 min    Equipment Utilized During Treatment Gait belt    Activity Tolerance Patient tolerated treatment well    Behavior During Therapy WFL for tasks assessed/performed             Past Medical History:  Diagnosis Date   ADENOCARCINOMA, PROSTATE, GLEASON GRADE 6 12/21/2009   prostate cancer   ALLERGIC RHINITIS 03/16/2007   Allergy    Anxiety    Arthritis    back    BACK PAIN WITH RADICULOPATHY 11/23/2008   Cancer of kidney ([place])    partial right kidney removed   CARPAL TUNNEL SYNDROME, BILATERAL 03/16/2007   Chronic back pain    Constipation    takes Senokot daily   Depression    occasionally   DIABETES MELLITUS, TYPE II 03/16/2007   takes Januvia and MEtformin daily   GASTROINTESTINAL HEMORRHAGE, HX OF 03/16/2007   GERD 03/16/2007   takes Omeprazole daily   Glaucoma    mild - no eye drops   Hemorrhoids    History of colon polyps    HYPERLIPIDEMIA 03/16/2007   takes Crestor daily   HYPERTENSION 03/16/2007   takes Diltiazem and Lisinopril daily    Kidney cancer, primary, with metastasis from kidney to other site [facility])    LEG CRAMPS 05/22/2007   Nocturia    Overactive bladder    PARESTHESIA 05/21/2007   Proctitis    PTSD (post-traumatic stress disorder)    wakes up may be dreaming of fighting   Rectal bleeding    Dr.Norins has explained its from the Radiation that he has received   Rectal bleeding    Renal cell carcinoma ([place])    Sleep apnea    uses CPAP  nightly   Stomach cancer ([place])    Urinary frequency    takes Toviaz daily   Past Surgical History:  Procedure Laterality Date   ADRENALECTOMY Right 02/26/2020   Procedure: OPEN RIGHT RENAL CELL METASTATECTOMY AND  ADRENALECTOMY;  Surgeon: [name], MD;  Location: [facility];  Service: Urology;  Laterality: Right;   [place], 2008   repeat surgery; ESI '08   [place]     bilateral   CERVICAL FUSION     COLONOSCOPY     colonosocpy     ESOPHAGOGASTRODUODENOSCOPY     ED   FLEXIBLE SIGMOIDOSCOPY N/A 01/08/2017   Procedure: FLEXIBLE SIGMOIDOSCOPY;  Surgeon: Irene Shipper, MD;  Location: [place];  Service: Endoscopy;  Laterality: N/A;   HOT HEMOSTASIS N/A 01/08/2017   Procedure: HOT HEMOSTASIS (ARGON PLASMA COAGULATION/BICAP);  Surgeon: Irene Shipper, MD;  Location: [facility];  Service: Endoscopy;  Laterality: N/A;   IR RADIOLOGIST EVAL & MGMT  12/17/2019   IR RADIOLOGIST EVAL & MGMT  07/01/2020   IR RADIOLOGIST EVAL & MGMT  08/25/2020   LUMBAR LAMINECTOMY  10/30/2011   Procedure: MICRODISCECTOMY  LUMBAR LAMINECTOMY;  Surgeon: Jessy Oto, MD;  Location: [place];  Service: Orthopedics;  Laterality: Right;  Right L4-5 and L5-S1 Microdiscectomy   LUMBAR LAMINECTOMY  06/17/2012   Procedure: MICRODISCECTOMY LUMBAR LAMINECTOMY;  Surgeon: Jessy Oto, MD;  Location: [place];  Service: Orthopedics;  Laterality: N/A;  Right L5-S1 microdiscectomy   OPEN PARTIAL HEPATECTOMY  N/A 02/26/2020   Procedure: OPEN PARTIAL HEPATECTOMY, CHOLECYSTECTOMY;  Surgeon: Stark Klein, MD;  Location: [facility];  Service: General;  Laterality: N/A;   POLYPECTOMY     PROSTATE SURGERY  march 2012   seed implant   RADIOFREQUENCY ABLATION N/A 07/21/2020   Procedure: CT MICROWAVE ABLATION;  Surgeon: Criselda Peaches, MD;  Location: [facility];  Service: Anesthesiology;  Laterality: N/A;   ROBOTIC ASSITED PARTIAL NEPHRECTOMY Right 09/08/2015   Procedure: XI ROBOTIC ASSITED RIGHT PARTIAL NEPHRECTOMY;   Surgeon: [name], MD;  Location: [facility];  Service: Urology;  Laterality: Right;  Clamp on: 1033 Clamp off: 1103 Total Clamp Time: 30 minutes   SHOULDER ARTHROSCOPY WITH SUBACROMIAL DECOMPRESSION Left 01/20/2016   Procedure: LEFT SHOULDER ARTHROSCOPY WITH EXTENSIVE  DEBRIDEMENT, ACROMIOPLASTY;  Surgeon: Ninetta Lights, MD;  Location: [place];  Service: Orthopedics;  Laterality: Left;   SHOULDER SURGERY  LFT   Stress Cardiolite  12/24/2003   negative for ischemia   UPPER ESOPHAGEAL ENDOSCOPIC ULTRASOUND (EUS)  04/18/2016   [facility]   UPPER GASTROINTESTINAL ENDOSCOPY     WOUND EXPLORATION     management of a wound that he sustained in the [place]   Patient Active Problem List   Diagnosis Date Noted   Vaccine for VZV (varicella-zoster virus) 06/12/2022   Hx of completed stroke 04/28/2022   Abnormal brain CT 04/07/2022   Encounter for general adult medical examination with abnormal findings 01/23/2022   Anemia due to zinc deficiency 12/20/2021   Low back pain 12/15/2021   Symptomatic anemia 12/15/2021   Cancer related pain 12/15/2021   Chronic heart failure with preserved ejection fraction (HFpEF) ([place]) 08/12/2021   Tinea cruris 08/12/2021   Anemia due to chronic kidney disease 07/20/2021   CKD (chronic kidney disease) stage 5, GFR less than 15 ml/min (HCC) 05/24/2021   BPH (benign prostatic hyperplasia) 05/24/2021   Diabetic renal disease ([place]) 05/20/2021   Dry eye syndrome of bilateral lacrimal glands 05/20/2021   Senile nuclear sclerosis 05/20/2021   Vitamin D deficiency 05/20/2021   Vitreous degeneration, unspecified eye 05/20/2021   Intrinsic eczema 04/18/2021   Atherosclerosis of aorta ([place]) 12/30/2020   Mixed conductive and sensorineural hearing loss of left ear with restricted hearing of right ear 09/01/2020   Tinnitus of both ears 05/20/2020   Metastatic renal cell carcinoma ([place]) 02/26/2020   Diabetic polyneuropathy associated with type 2  diabetes mellitus ([place]) 12/18/2019   Murmur, cardiac 10/08/2018   DDD (degenerative disc disease), cervical 10/08/2018   OAB (overactive bladder) 04/04/2018   Seasonal allergic rhinitis due to pollen 11/01/2016   Microalbuminuria due to type 2 diabetes mellitus ([place]) 07/27/2016   Varicose veins of both lower extremities 08/06/2015   Varicose veins of lower extremities with ulcer ([place]) 06/16/2015   Internal hemorrhoid, bleeding 08/03/2014   Post traumatic stress disorder (PTSD) 01/29/2014   Spinal stenosis of lumbar region with radiculopathy 10/30/2011    Class: Chronic   Obstructive sleep apnea 11/24/2010   Obesity 11/24/2010   Routine general medical examination at a health care facility 11/24/2010   ADENOCARCINOMA, PROSTATE, GLEASON GRADE 6 12/21/2009  Essential hypertension 03/16/2007   GERD 03/16/2007    ONSET DATE: 04/07/22  REFERRING DIAG: Z86.73 (ICD-10-CM) - Hx of completed stroke  THERAPY DIAG:  Unsteadiness on feet  Muscle weakness (generalized)  Other abnormalities of gait and mobility  Rationale for Evaluation and Treatment: Rehabilitation  SUBJECTIVE:                                                                                                                                                                                             SUBJECTIVE STATEMENT: He took all of his medicines this morning and states he feels fine.  He denies falls or acute changes and further denies pain at this time. Pt accompanied by: significant other [place])  PERTINENT HISTORY: DONLEY BIERMANN is a 77 y.o. R-handed male with history of DM2 with diabetic polyneuropathy, OSA on nightly CPAP, PTSD, history of lumbar stenosis with chronic back pain, metastatic renal carcinoma with lymphadenopathy and questionable hepatic involvement noted in 2022, not on chemo due to poor tolerance to the medications, and without significant progression since May 2023, only on surveillance imaging,  stage IV, eczema, vitamin D deficiency, CKD stage V refusing HD, BPH, anemia of CKD  Zinc deficiency, chronic heart failure, mild cognitive impairment, presenting for evaluation of left PCA territory acute CVA.  PAIN:  Are you having pain? No  PRECAUTIONS: Fall  WEIGHT BEARING RESTRICTIONS: No  OBJECTIVE:  TODAY'S TREATMENT:     Vitals (LUE in sitting prior to activity):   Following scifit: 178/96, sitting rest break for 3 mins: 169/96, following 2-3 more minutes of rest: 130/87   Seated scifit for generalized strengthening and endurance: 6 mins at level 2 with  BUEs/LEs maintaining rpms in 60-70's per PT cuing.  Pt with marked difficulty initially with resistance at 4, then 3 so moved down to 2 to allow more cardiovascular endurance training.  Pt tolerated well.    NMR:  Standing in // bars on small rocker board biased both vertically maintaining balance x 2 sets of 20 secs>rocking board forwards/backwards within limits of stability encouraging ankle and hip strategy rather than UE support on bars.  Biased horizontally maintaining balance x 2 sets of 20 secs, mini squats on board x 10 reps without support as able. Note that he tends to lose balance posteriorly so had him stand on mat on ramp (facing incline) with feet apart holding balance x 20 secs>marching in place x 20 reps with intermittent min A for LOB posteriorly.   BP following balance tasks: 169/93, rested x 3 mins:  165/93  Continued on ramp in staggered stance holding balance x 20 secs,  progressing to EC x 20 secs with intermittent min/guard to min A with cues for improved proximal hip activation on stance leg.  Ended on ramp facing sideways  BP following this set of exercises 159/106, after 3-4 mins rest: 157/86  Continue to educate on letting MD know about BP issues and giving examples in therapy as he tends to have big BP changes in session.  PATIENT EDUCATION: Education details: Continue HEP.  Canceling visit outside current  cert period and plan for upcoming discharge. Person educated: Patient and Spouse Education method: Explanation, Demonstration, and Verbal cues Education comprehension: verbalized understanding and needs further education  HOME EXERCISE PROGRAM:  Access Code: [email or web address] URL: [email or web address] Date: 06/29/2022 Prepared by: Elease Etienne  Exercises - Corner Balance Feet Together With Eyes Closed  - 1 x daily - 5 x weekly - 1 sets - 2-3 reps - 30-45 seconds hold - Supine Bridge  - 1 x daily - 4-5 x weekly - 2 sets - 10 reps - Sit to Stand with Arms Crossed  - 1 x daily - 5 x weekly - 1-2 sets - 8 reps - Standing Hip Extension with Leg Bent and Support  - 1 x daily - 4-5 x weekly - 1 sets - 10 reps  GOALS: Goals reviewed with patient? Yes  SHORT TERM GOALS: Target date: 05/26/22  Pt will be IND with initial HEP in order to indicate improved functional mobility and dec fall risk. Baseline:  Pt states compliance and no issues (1/18) Goal status: MET  2.  Pt will improve gait speed to >/=2.62 ft/sec w/ SPC in order to indicate dec fall risk.  Baseline: 2.00 ft/sec; 1.90 ft/sec (1/18) Goal status: NOT MET  3.  Pt will ambulate 49' without at mod I level with only minimal Trendelenburg in order to indicate more independent household ambulation.   Baseline: ongoing compensated right trendelenburg x200' SBA (1/18) Goal status: IN PROGRESS  4.  Pt will improve FGA to >/=18/30 in order to indicate dec fall risk.   Baseline: 12/30; 11/30 (1/23) Goal status: NOT MET  5.  Pt will improve 5TSS to </=30 secs without UE support only in order to indicate dec fall risk and improved functional strength.   Baseline: approx 37 secs with hands on lap and mild anterior LOB; 26.59 sec no UE support or LOB (1/18) Goal status: MET  6.  Pt will negotiate up/down 4 steps with single rail/cane, up/down ramp and curb and ambulate x 500' outdoors over unlevel paved surfaces at S level  in order to indicate improved community mobility.   Baseline: 4 stairs right rail, curb and ramp SBA (1/18); pt declines to ambulate outside due to cold weather (1/23) Goal status: IN PROGRESS  UPDATED LONG TERM GOALS: Target date: 07/22/22  Pt will be IND with final HEP in order to indicate improved functional mobility and dec fall risk. Baseline:  Goal status: ONGOING  2.  Pt will improve FGA to >/=23/30 in order to indicate dec fall risk.   Baseline: 13/30 06/22/22 Goal status: ONGOING  3.  Pt will improve 5TSS to </=20 secs without UE support only in order to indicate dec fall risk and improved functional strength.   Baseline: 27.22 with hands on lap then use of arm rests Goal status: ONGOING  4.  Pt will improve gait speed to >/=2.62 ft/sec w/o AD in order to indicate dec fall risk.  Baseline: 2.38 ft/sec 06/22/22 Goal status: ONGOING   5.  Pt will negotiate  up/down 4 steps with single rail, up/down ramp and curb and ambulate x 500' outdoors over unlevel paved surfaces without device at S level in order to indicate improved community mobility.   Baseline:  Goal status: ONGOING   ASSESSMENT:  CLINICAL IMPRESSION: Session somewhat limited by ongoing blood pressure issues.  It remained elevated through most of session but did have a drop of about 40 systolically so educated them to let MD know so that any medication adjustments can be made.  Session continues to focus on balance and LLE strengthening.    OBJECTIVE IMPAIRMENTS: Abnormal gait, decreased activity tolerance, decreased balance, decreased endurance, decreased mobility, decreased strength, impaired perceived functional ability, and postural dysfunction.   ACTIVITY LIMITATIONS: carrying, lifting, bending, standing, squatting, stairs, transfers, and locomotion level  PARTICIPATION LIMITATIONS: meal prep, cleaning, medication management, personal finances, driving, and community activity  PERSONAL FACTORS: 3+  comorbidities: see above  are also affecting patient's functional outcome.   REHAB POTENTIAL: Good  CLINICAL DECISION MAKING: Evolving/moderate complexity  EVALUATION COMPLEXITY: Moderate  PLAN:  PT FREQUENCY: 2x/week  PT DURATION: 4 weeks  PLANNED INTERVENTIONS: Therapeutic exercises, Therapeutic activity, Neuromuscular re-education, Balance training, Gait training, Patient/Family education, Self Care, Stair training, Vestibular training, DME instructions, and Aquatic Therapy  PLAN FOR NEXT SESSION: gait with and without cane, dynamic stability, hip extension, compliant surfaces, hamstring strength, leg press vs squats w/ UE support, STS w/ ball push, additions to HEP?   Cameron Sprang, PT, MPT [facility] [address] [place], [place], [address] Phone: [phone]   Fax:  [phone] 07/13/22, 11:53 AM

## 2022-07-18 ENCOUNTER — Encounter: Payer: Self-pay | Admitting: Physical Therapy

## 2022-07-18 ENCOUNTER — Ambulatory Visit: Payer: Medicare Other | Attending: Internal Medicine | Admitting: Physical Therapy

## 2022-07-18 VITALS — BP 156/86 | HR 69

## 2022-07-18 DIAGNOSIS — R2681 Unsteadiness on feet: Secondary | ICD-10-CM | POA: Diagnosis not present

## 2022-07-18 DIAGNOSIS — R41840 Attention and concentration deficit: Secondary | ICD-10-CM | POA: Insufficient documentation

## 2022-07-18 DIAGNOSIS — R41844 Frontal lobe and executive function deficit: Secondary | ICD-10-CM | POA: Diagnosis present

## 2022-07-18 DIAGNOSIS — R41842 Visuospatial deficit: Secondary | ICD-10-CM | POA: Insufficient documentation

## 2022-07-18 DIAGNOSIS — R278 Other lack of coordination: Secondary | ICD-10-CM | POA: Insufficient documentation

## 2022-07-18 DIAGNOSIS — R2689 Other abnormalities of gait and mobility: Secondary | ICD-10-CM

## 2022-07-18 DIAGNOSIS — M6281 Muscle weakness (generalized): Secondary | ICD-10-CM | POA: Diagnosis not present

## 2022-07-18 NOTE — Therapy (Signed)
OUTPATIENT PHYSICAL THERAPY NEURO TREATMENT   Patient Name: Antonio Adams MRN: [id number] DOB:01-22-1946, 77 y.o., male Today's Date: 07/18/2022   PCP: Scarlette Calico, MD REFERRING PROVIDER: Pricilla Holm, MD  END OF SESSION:  PT End of Session - 07/18/22 1155     Visit Number 13    Number of Visits 17    Date for PT Re-Evaluation 07/22/22   updated POC   Authorization Type [employer] Medicare    Progress Note Due on Visit 20    PT Start Time 1150    PT Stop Time 1228    PT Time Calculation (min) 38 min    Equipment Utilized During Treatment Gait belt    Activity Tolerance Patient tolerated treatment well;Patient limited by pain    Behavior During Therapy WFL for tasks assessed/performed             Past Medical History:  Diagnosis Date   ADENOCARCINOMA, PROSTATE, GLEASON GRADE 6 12/21/2009   prostate cancer   ALLERGIC RHINITIS 03/16/2007   Allergy    Anxiety    Arthritis    back    BACK PAIN WITH RADICULOPATHY 11/23/2008   Cancer of kidney ([place])    partial right kidney removed   CARPAL TUNNEL SYNDROME, BILATERAL 03/16/2007   Chronic back pain    Constipation    takes Senokot daily   Depression    occasionally   DIABETES MELLITUS, TYPE II 03/16/2007   takes Januvia and MEtformin daily   GASTROINTESTINAL HEMORRHAGE, HX OF 03/16/2007   GERD 03/16/2007   takes Omeprazole daily   Glaucoma    mild - no eye drops   Hemorrhoids    History of colon polyps    HYPERLIPIDEMIA 03/16/2007   takes Crestor daily   HYPERTENSION 03/16/2007   takes Diltiazem and Lisinopril daily    Kidney cancer, primary, with metastasis from kidney to other site [facility])    LEG CRAMPS 05/22/2007   Nocturia    Overactive bladder    PARESTHESIA 05/21/2007   Proctitis    PTSD (post-traumatic stress disorder)    wakes up may be dreaming of fighting   Rectal bleeding    Dr.Norins has explained its from the Radiation that he has received   Rectal bleeding    Renal cell carcinoma ([place])    Sleep  apnea    uses CPAP nightly   Stomach cancer ([place])    Urinary frequency    takes Toviaz daily   Past Surgical History:  Procedure Laterality Date   ADRENALECTOMY Right 02/26/2020   Procedure: OPEN RIGHT RENAL CELL METASTATECTOMY AND  ADRENALECTOMY;  Surgeon: [name], MD;  Location: [facility];  Service: Urology;  Laterality: Right;   [place], 2008   repeat surgery; ESI '08   [place]     bilateral   CERVICAL FUSION     COLONOSCOPY     colonosocpy     ESOPHAGOGASTRODUODENOSCOPY     ED   FLEXIBLE SIGMOIDOSCOPY N/A 01/08/2017   Procedure: FLEXIBLE SIGMOIDOSCOPY;  Surgeon: Irene Shipper, MD;  Location: [place];  Service: Endoscopy;  Laterality: N/A;   HOT HEMOSTASIS N/A 01/08/2017   Procedure: HOT HEMOSTASIS (ARGON PLASMA COAGULATION/BICAP);  Surgeon: Irene Shipper, MD;  Location: [facility] END[facility]COPY;  Service: Endoscopy;  Laterality: N/A;   IR RADIOLOGIST EVAL & MGMT  12/17/2019   IR RADIOLOGIST EVAL & MGMT  07/01/2020   IR RADIOLOGIST EVAL & MGMT  08/25/2020   LUMBAR LAMINECTOMY  10/30/2011  Procedure: MICRODISCECTOMY LUMBAR LAMINECTOMY;  Surgeon: Jessy Oto, MD;  Location: [place];  Service: Orthopedics;  Laterality: Right;  Right L4-5 and L5-S1 Microdiscectomy   LUMBAR LAMINECTOMY  06/17/2012   Procedure: MICRODISCECTOMY LUMBAR LAMINECTOMY;  Surgeon: Jessy Oto, MD;  Location: [place];  Service: Orthopedics;  Laterality: N/A;  Right L5-S1 microdiscectomy   OPEN PARTIAL HEPATECTOMY  N/A 02/26/2020   Procedure: OPEN PARTIAL HEPATECTOMY, CHOLECYSTECTOMY;  Surgeon: Stark Klein, MD;  Location: [facility];  Service: General;  Laterality: N/A;   POLYPECTOMY     PROSTATE SURGERY  march 2012   seed implant   RADIOFREQUENCY ABLATION N/A 07/21/2020   Procedure: CT MICROWAVE ABLATION;  Surgeon: Criselda Peaches, MD;  Location: [facility];  Service: Anesthesiology;  Laterality: N/A;   ROBOTIC ASSITED PARTIAL NEPHRECTOMY Right 09/08/2015   Procedure: XI ROBOTIC ASSITED RIGHT  PARTIAL NEPHRECTOMY;  Surgeon: [name], MD;  Location: [facility];  Service: Urology;  Laterality: Right;  Clamp on: 1033 Clamp off: 1103 Total Clamp Time: 30 minutes   SHOULDER ARTHROSCOPY WITH SUBACROMIAL DECOMPRESSION Left 01/20/2016   Procedure: LEFT SHOULDER ARTHROSCOPY WITH EXTENSIVE  DEBRIDEMENT, ACROMIOPLASTY;  Surgeon: Ninetta Lights, MD;  Location: [place];  Service: Orthopedics;  Laterality: Left;   SHOULDER SURGERY  LFT   Stress Cardiolite  12/24/2003   negative for ischemia   UPPER ESOPHAGEAL ENDOSCOPIC ULTRASOUND (EUS)  04/18/2016   [facility]   UPPER GASTROINTESTINAL ENDOSCOPY     WOUND EXPLORATION     management of a wound that he sustained in the [place]   Patient Active Problem List   Diagnosis Date Noted   Vaccine for VZV (varicella-zoster virus) 06/12/2022   Hx of completed stroke 04/28/2022   Abnormal brain CT 04/07/2022   Encounter for general adult medical examination with abnormal findings 01/23/2022   Anemia due to zinc deficiency 12/20/2021   Low back pain 12/15/2021   Symptomatic anemia 12/15/2021   Cancer related pain 12/15/2021   Chronic heart failure with preserved ejection fraction (HFpEF) ([place]) 08/12/2021   Tinea cruris 08/12/2021   Anemia due to chronic kidney disease 07/20/2021   CKD (chronic kidney disease) stage 5, GFR less than 15 ml/min (HCC) 05/24/2021   BPH (benign prostatic hyperplasia) 05/24/2021   Diabetic renal disease ([place]) 05/20/2021   Dry eye syndrome of bilateral lacrimal glands 05/20/2021   Senile nuclear sclerosis 05/20/2021   Vitamin D deficiency 05/20/2021   Vitreous degeneration, unspecified eye 05/20/2021   Intrinsic eczema 04/18/2021   Atherosclerosis of aorta ([place]) 12/30/2020   Mixed conductive and sensorineural hearing loss of left ear with restricted hearing of right ear 09/01/2020   Tinnitus of both ears 05/20/2020   Metastatic renal cell carcinoma ([place]) 02/26/2020   Diabetic polyneuropathy  associated with type 2 diabetes mellitus ([place]) 12/18/2019   Murmur, cardiac 10/08/2018   DDD (degenerative disc disease), cervical 10/08/2018   OAB (overactive bladder) 04/04/2018   Seasonal allergic rhinitis due to pollen 11/01/2016   Microalbuminuria due to type 2 diabetes mellitus ([place]) 07/27/2016   Varicose veins of both lower extremities 08/06/2015   Varicose veins of lower extremities with ulcer ([place]) 06/16/2015   Internal hemorrhoid, bleeding 08/03/2014   Post traumatic stress disorder (PTSD) 01/29/2014   Spinal stenosis of lumbar region with radiculopathy 10/30/2011    Class: Chronic   Obstructive sleep apnea 11/24/2010   Obesity 11/24/2010   Routine general medical examination at a health care facility 11/24/2010   ADENOCARCINOMA, PROSTATE, GLEASON GRADE 6 12/21/2009  Essential hypertension 03/16/2007   GERD 03/16/2007    ONSET DATE: 04/07/22  REFERRING DIAG: Z86.73 (ICD-10-CM) - Hx of completed stroke  THERAPY DIAG:  Unsteadiness on feet  Muscle weakness (generalized)  Other abnormalities of gait and mobility  Other lack of coordination  Rationale for Evaluation and Treatment: Rehabilitation  SUBJECTIVE:                                                                                                                                                                                             SUBJECTIVE STATEMENT: Pt states he is not doing well today due to low back pain.  He did take his morning medicines. Pt accompanied by: significant other [place])  PERTINENT HISTORY: DEJA ITURRALDE is a 77 y.o. R-handed male with history of DM2 with diabetic polyneuropathy, OSA on nightly CPAP, PTSD, history of lumbar stenosis with chronic back pain, metastatic renal carcinoma with lymphadenopathy and questionable hepatic involvement noted in 2022, not on chemo due to poor tolerance to the medications, and without significant progression since May 2023, only on surveillance  imaging, stage IV, eczema, vitamin D deficiency, CKD stage V refusing HD, BPH, anemia of CKD  Zinc deficiency, chronic heart failure, mild cognitive impairment, presenting for evaluation of left PCA territory acute CVA.  PAIN:  Are you having pain? Yes: NPRS scale: 6/10 Pain location: low back pain Pain description: achy Aggravating factors: standing, walking Relieving factors: resting  PRECAUTIONS: Fall  WEIGHT BEARING RESTRICTIONS: No  OBJECTIVE:  TODAY'S TREATMENT:     Vitals (LUE in sitting prior to activity):   Today's Vitals   07/18/22 1156  BP: (!) 156/86  Pulse: 69   Reviewed and modified HEP: -Corner balance feet together eyes closed 2x40 seconds w/ moderate sway, no LOB -Standing kickbacks w/ bent knee, pt performs more knee extension even w/ facilitation and cuing, removed from HEP -STS w/ arms across chest 2x5, edu on forward scoot to improve leverage of stand, attempted teachback w/ patient unsure of concept. -Supine bridge, focus on eccentric control and pelvic stability during lift x8 -Supine LTRs x20 w/ cues to increase ROM w/ each sweep -Supine bent knee fallouts  x10 each LE, cues for TrA engagement -Side-lying hip extension w/ bent knee, pt has better engagement w/ RLE x9, LLE (to neutral) x6 before fatigue -Supine single knee to chest stretch x45 seconds each LE -Seated hamstring stretch 2x45 seconds each side, cued for breathing to relax tissue  BP following this set of exercises 148/89, HR 68 bpm   PATIENT EDUCATION: Education details: Continue HEP.  Discharge plan for next session.  Discuss BP fluctuation  with MD and monitor at home (wife states it dropped from at-home reading at onset of session).  Recommended continued use of the cane for prolonged distances, unlevel surfaces, and days of general discomfort for increased stability. Person educated: Patient and Spouse Education method: Explanation, Demonstration, and Verbal cues Education comprehension:  verbalized understanding and needs further education  HOME EXERCISE PROGRAM: Access Code: [email or web address] URL: [email or web address] Date: 07/18/2022 Prepared by: Elease Etienne  Exercises - Corner Balance Feet Together With Eyes Closed  - 1 x daily - 5 x weekly - 1 sets - 2-3 reps - 30-45 seconds hold - Supine Bridge  - 1 x daily - 4-5 x weekly - 2 sets - 10 reps - Sit to Stand with Arms Crossed  - 1 x daily - 5 x weekly - 1-2 sets - 8 reps - Lower Trunk Rotations  - 1 x daily - 4 x weekly - 2 sets - 15-20 reps - Seated Hamstring Stretch  - 1 x daily - 5 x weekly - 1 sets - 2-3 reps - 45 seconds hold  GOALS: Goals reviewed with patient? Yes  SHORT TERM GOALS: Target date: 05/26/22  Pt will be IND with initial HEP in order to indicate improved functional mobility and dec fall risk. Baseline:  Pt states compliance and no issues (1/18) Goal status: MET  2.  Pt will improve gait speed to >/=2.62 ft/sec w/ SPC in order to indicate dec fall risk.  Baseline: 2.00 ft/sec; 1.90 ft/sec (1/18) Goal status: NOT MET  3.  Pt will ambulate 3' without at mod I level with only minimal Trendelenburg in order to indicate more independent household ambulation.   Baseline: ongoing compensated right trendelenburg x200' SBA (1/18) Goal status: IN PROGRESS  4.  Pt will improve FGA to >/=18/30 in order to indicate dec fall risk.   Baseline: 12/30; 11/30 (1/23) Goal status: NOT MET  5.  Pt will improve 5TSS to </=30 secs without UE support only in order to indicate dec fall risk and improved functional strength.   Baseline: approx 37 secs with hands on lap and mild anterior LOB; 26.59 sec no UE support or LOB (1/18) Goal status: MET  6.  Pt will negotiate up/down 4 steps with single rail/cane, up/down ramp and curb and ambulate x 500' outdoors over unlevel paved surfaces at S level in order to indicate improved community mobility.   Baseline: 4 stairs right rail, curb and ramp SBA  (1/18); pt declines to ambulate outside due to cold weather (1/23) Goal status: IN PROGRESS  UPDATED LONG TERM GOALS: Target date: 07/22/22  Pt will be IND with final HEP in order to indicate improved functional mobility and dec fall risk. Baseline:  Goal status: ONGOING  2.  Pt will improve FGA to >/=23/30 in order to indicate dec fall risk.   Baseline: 13/30 06/22/22 Goal status: ONGOING  3.  Pt will improve 5TSS to </=20 secs without UE support only in order to indicate dec fall risk and improved functional strength.   Baseline: 27.22 with hands on lap then use of arm rests Goal status: ONGOING  4.  Pt will improve gait speed to >/=2.62 ft/sec w/o AD in order to indicate dec fall risk.  Baseline: 2.38 ft/sec 06/22/22 Goal status: ONGOING   5.  Pt will negotiate up/down 4 steps with single rail, up/down ramp and curb and ambulate x 500' outdoors over unlevel paved surfaces without device at S level in order to indicate improved community mobility.  Baseline:  Goal status: ONGOING   ASSESSMENT:  CLINICAL IMPRESSION: Session limited by pain with patient having more low back pain contributing to noted standing instability in session today.  Reviewed and made additions to HEP to promote pain management for the benefit of standing balance and pelvic stability in stance.  Discussion of ongoing limitations and plan for discharge next session with encouragement of patient to continue activity in various forms using HEP and walking regularly to supplement preferred active activities.  Will assess LTGs and discharge patient at next session.  OBJECTIVE IMPAIRMENTS: Abnormal gait, decreased activity tolerance, decreased balance, decreased endurance, decreased mobility, decreased strength, impaired perceived functional ability, and postural dysfunction.   ACTIVITY LIMITATIONS: carrying, lifting, bending, standing, squatting, stairs, transfers, and locomotion level  PARTICIPATION LIMITATIONS:  meal prep, cleaning, medication management, personal finances, driving, and community activity  PERSONAL FACTORS: 3+ comorbidities: see above  are also affecting patient's functional outcome.   REHAB POTENTIAL: Good  CLINICAL DECISION MAKING: Evolving/moderate complexity  EVALUATION COMPLEXITY: Moderate  PLAN:  PT FREQUENCY: 2x/week  PT DURATION: 4 weeks  PLANNED INTERVENTIONS: Therapeutic exercises, Therapeutic activity, Neuromuscular re-education, Balance training, Gait training, Patient/Family education, Self Care, Stair training, Vestibular training, DME instructions, and Aquatic Therapy  PLAN FOR NEXT SESSION: ASSESS LTGS-D/C!   Elease Etienne, PT, [place] [address] [place], [place], [address] Phone: [phone]   Fax:  [phone] 07/18/22, 12:43 PM

## 2022-07-18 NOTE — Patient Instructions (Signed)
Access Code: [email or web address] URL: [email or web address] Date: 07/18/2022 Prepared by: Elease Etienne  Exercises - Corner Balance Feet Together With Eyes Closed  - 1 x daily - 5 x weekly - 1 sets - 2-3 reps - 30-45 seconds hold - Supine Bridge  - 1 x daily - 4-5 x weekly - 2 sets - 10 reps - Sit to Stand with Arms Crossed  - 1 x daily - 5 x weekly - 1-2 sets - 8 reps - Lower Trunk Rotations  - 1 x daily - 4 x weekly - 2 sets - 15-20 reps - Seated Hamstring Stretch  - 1 x daily - 5 x weekly - 1 sets - 2-3 reps - 45 seconds hold

## 2022-07-19 ENCOUNTER — Encounter (HOSPITAL_COMMUNITY): Payer: Medicare Other

## 2022-07-20 ENCOUNTER — Encounter: Payer: Self-pay | Admitting: Physical Therapy

## 2022-07-20 ENCOUNTER — Encounter: Payer: Self-pay | Admitting: Occupational Therapy

## 2022-07-20 ENCOUNTER — Ambulatory Visit: Payer: Medicare Other | Admitting: Physical Therapy

## 2022-07-20 ENCOUNTER — Ambulatory Visit: Payer: Medicare Other | Admitting: Occupational Therapy

## 2022-07-20 VITALS — BP 113/87

## 2022-07-20 DIAGNOSIS — R2681 Unsteadiness on feet: Secondary | ICD-10-CM | POA: Diagnosis not present

## 2022-07-20 DIAGNOSIS — M6281 Muscle weakness (generalized): Secondary | ICD-10-CM | POA: Diagnosis not present

## 2022-07-20 DIAGNOSIS — R41844 Frontal lobe and executive function deficit: Secondary | ICD-10-CM

## 2022-07-20 DIAGNOSIS — R278 Other lack of coordination: Secondary | ICD-10-CM

## 2022-07-20 DIAGNOSIS — R2689 Other abnormalities of gait and mobility: Secondary | ICD-10-CM

## 2022-07-20 DIAGNOSIS — R41840 Attention and concentration deficit: Secondary | ICD-10-CM

## 2022-07-20 DIAGNOSIS — R41842 Visuospatial deficit: Secondary | ICD-10-CM

## 2022-07-20 NOTE — Therapy (Signed)
OUTPATIENT PHYSICAL THERAPY NEURO TREATMENT/DISCHARGE SUMMARY   Patient Name: Antonio Adams MRN: [id number] DOB:1946/03/23, 77 y.o., male Today's Date: 07/20/2022   PCP: Scarlette Calico, MD REFERRING PROVIDER: Pricilla Holm, MD  PHYSICAL THERAPY DISCHARGE SUMMARY  Visits from Start of Care: 14  Current functional level related to goals / functional outcomes: See clinical impression statement.   Remaining deficits: Chronic gait deficits linked to low back discomfort, ongoing    Education / Equipment: Continue HEP, monitoring BP, progress towards goals, and discharge plan.  Discussed safety w/ cane for increased distances and unlevel surfaces.   Patient agrees to discharge. Patient goals were partially met. Patient is being discharged due to maximized rehab potential.    END OF SESSION:  PT End of Session - 07/20/22 1321     Visit Number 14    Number of Visits 17    Date for PT Re-Evaluation 07/22/22   updated POC   Authorization Type [employer]    Progress Note Due on Visit 20    PT Start Time 1315    PT Stop Time 1358    PT Time Calculation (min) 43 min    Equipment Utilized During Treatment Gait belt    Activity Tolerance Patient tolerated treatment well    Behavior During Therapy WFL for tasks assessed/performed             Past Medical History:  Diagnosis Date   ADENOCARCINOMA, PROSTATE, GLEASON GRADE 6 12/21/2009   prostate cancer   ALLERGIC RHINITIS 03/16/2007   Allergy    Anxiety    Arthritis    back    BACK PAIN WITH RADICULOPATHY 11/23/2008   Cancer of kidney ([place])    partial right kidney removed   CARPAL TUNNEL SYNDROME, BILATERAL 03/16/2007   Chronic back pain    Constipation    takes Senokot daily   Depression    occasionally   DIABETES MELLITUS, TYPE II 03/16/2007   takes Januvia and MEtformin daily   GASTROINTESTINAL HEMORRHAGE, HX OF 03/16/2007   GERD 03/16/2007   takes Omeprazole daily   Glaucoma    mild - no eye drops    Hemorrhoids    History of colon polyps    HYPERLIPIDEMIA 03/16/2007   takes Crestor daily   HYPERTENSION 03/16/2007   takes Diltiazem and Lisinopril daily    Kidney cancer, primary, with metastasis from kidney to other site [facility])    LEG CRAMPS 05/22/2007   Nocturia    Overactive bladder    PARESTHESIA 05/21/2007   Proctitis    PTSD (post-traumatic stress disorder)    wakes up may be dreaming of fighting   Rectal bleeding    Dr.Norins has explained its from the Radiation that he has received   Rectal bleeding    Renal cell carcinoma ([place])    Sleep apnea    uses CPAP nightly   Stomach cancer ([place])    Urinary frequency    takes Toviaz daily   Past Surgical History:  Procedure Laterality Date   ADRENALECTOMY Right 02/26/2020   Procedure: OPEN RIGHT RENAL CELL METASTATECTOMY AND  ADRENALECTOMY;  Surgeon: [name], MD;  Location: [facility];  Service: Urology;  Laterality: Right;   [place], 2008   repeat surgery; ESI '08   CARPAL TUNNEL RELEASE     bilateral   CERVICAL FUSION     COLONOSCOPY     colonosocpy     ESOPHAGOGASTRODUODENOSCOPY     ED   FLEXIBLE SIGMOIDOSCOPY N/A  01/08/2017   Procedure: FLEXIBLE SIGMOIDOSCOPY;  Surgeon: Irene Shipper, MD;  Location: [facility] ENDOSCOPY;  Service: Endoscopy;  Laterality: N/A;   HOT HEMOSTASIS N/A 01/08/2017   Procedure: HOT HEMOSTASIS (ARGON PLASMA COAGULATION/BICAP);  Surgeon: Irene Shipper, MD;  Location: [facility] ENDOSCOPY;  Service: Endoscopy;  Laterality: N/A;   IR RADIOLOGIST EVAL & MGMT  12/17/2019   IR RADIOLOGIST EVAL & MGMT  07/01/2020   IR RADIOLOGIST EVAL & MGMT  08/25/2020   LUMBAR LAMINECTOMY  10/30/2011   Procedure: MICRODISCECTOMY LUMBAR LAMINECTOMY;  Surgeon: Jessy Oto, MD;  Location: [place];  Service: Orthopedics;  Laterality: Right;  Right L4-5 and L5-S1 Microdiscectomy   LUMBAR LAMINECTOMY  06/17/2012   Procedure: MICRODISCECTOMY LUMBAR LAMINECTOMY;  Surgeon: Jessy Oto, MD;  Location: [place];  Service: Orthopedics;   Laterality: N/A;  Right L5-S1 microdiscectomy   OPEN PARTIAL HEPATECTOMY  N/A 02/26/2020   Procedure: OPEN PARTIAL HEPATECTOMY, CHOLECYSTECTOMY;  Surgeon: Stark Klein, MD;  Location: [facility];  Service: General;  Laterality: N/A;   POLYPECTOMY     PROSTATE SURGERY  march 2012   seed implant   RADIOFREQUENCY ABLATION N/A 07/21/2020   Procedure: CT MICROWAVE ABLATION;  Surgeon: Criselda Peaches, MD;  Location: [facility];  Service: Anesthesiology;  Laterality: N/A;   ROBOTIC ASSITED PARTIAL NEPHRECTOMY Right 09/08/2015   Procedure: XI ROBOTIC ASSITED RIGHT PARTIAL NEPHRECTOMY;  Surgeon: [name], MD;  Location: [facility];  Service: Urology;  Laterality: Right;  Clamp on: 1033 Clamp off: 1103 Total Clamp Time: 30 minutes   SHOULDER ARTHROSCOPY WITH SUBACROMIAL DECOMPRESSION Left 01/20/2016   Procedure: LEFT SHOULDER ARTHROSCOPY WITH EXTENSIVE  DEBRIDEMENT, ACROMIOPLASTY;  Surgeon: Ninetta Lights, MD;  Location: [place];  Service: Orthopedics;  Laterality: Left;   SHOULDER SURGERY  LFT   Stress Cardiolite  12/24/2003   negative for ischemia   UPPER ESOPHAGEAL ENDOSCOPIC ULTRASOUND (EUS)  04/18/2016   [facility]   UPPER GASTROINTESTINAL ENDOSCOPY     WOUND EXPLORATION     management of a wound that he sustained in the [place]   Patient Active Problem List   Diagnosis Date Noted   Vaccine for VZV (varicella-zoster virus) 06/12/2022   Hx of completed stroke 04/28/2022   Abnormal brain CT 04/07/2022   Encounter for general adult medical examination with abnormal findings 01/23/2022   Anemia due to zinc deficiency 12/20/2021   Low back pain 12/15/2021   Symptomatic anemia 12/15/2021   Cancer related pain 12/15/2021   Chronic heart failure with preserved ejection fraction (HFpEF) ([place]) 08/12/2021   Tinea cruris 08/12/2021   Anemia due to chronic kidney disease 07/20/2021   CKD (chronic kidney disease) stage 5, GFR less than 15 ml/min (HCC) 05/24/2021   BPH (benign  prostatic hyperplasia) 05/24/2021   Diabetic renal disease ([place]) 05/20/2021   Dry eye syndrome of bilateral lacrimal glands 05/20/2021   Senile nuclear sclerosis 05/20/2021   Vitamin D deficiency 05/20/2021   Vitreous degeneration, unspecified eye 05/20/2021   Intrinsic eczema 04/18/2021   Atherosclerosis of aorta ([place]) 12/30/2020   Mixed conductive and sensorineural hearing loss of left ear with restricted hearing of right ear 09/01/2020   Tinnitus of both ears 05/20/2020   Metastatic renal cell carcinoma ([place]) 02/26/2020   Diabetic polyneuropathy associated with type 2 diabetes mellitus ([place]) 12/18/2019   Murmur, cardiac 10/08/2018   DDD (degenerative disc disease), cervical 10/08/2018   OAB (overactive bladder) 04/04/2018   Seasonal allergic rhinitis due to pollen 11/01/2016   Microalbuminuria  due to type 2 diabetes mellitus ([place]) 07/27/2016   Varicose veins of both lower extremities 08/06/2015   Varicose veins of lower extremities with ulcer ([place]) 06/16/2015   Internal hemorrhoid, bleeding 08/03/2014   Post traumatic stress disorder (PTSD) 01/29/2014   Spinal stenosis of lumbar region with radiculopathy 10/30/2011    Class: Chronic   Obstructive sleep apnea 11/24/2010   Obesity 11/24/2010   Routine general medical examination at a health care facility 11/24/2010   ADENOCARCINOMA, PROSTATE, GLEASON GRADE 6 12/21/2009   Essential hypertension 03/16/2007   GERD 03/16/2007    ONSET DATE: 04/07/22  REFERRING DIAG: Z86.73 (ICD-10-CM) - Hx of completed stroke  THERAPY DIAG:  Unsteadiness on feet  Muscle weakness (generalized)  Other abnormalities of gait and mobility  Other lack of coordination  Rationale for Evaluation and Treatment: Rehabilitation  SUBJECTIVE:                                                                                                                                                                                             SUBJECTIVE STATEMENT: Pt  feels better today and denies pain.  He denies falls or near falls. Pt accompanied by: significant other [place])  PERTINENT HISTORY: IVIS BERENDS is a 77 y.o. R-handed male with history of DM2 with diabetic polyneuropathy, OSA on nightly CPAP, PTSD, history of lumbar stenosis with chronic back pain, metastatic renal carcinoma with lymphadenopathy and questionable hepatic involvement noted in 2022, not on chemo due to poor tolerance to the medications, and without significant progression since May 2023, only on surveillance imaging, stage IV, eczema, vitamin D deficiency, CKD stage V refusing HD, BPH, anemia of CKD  Zinc deficiency, chronic heart failure, mild cognitive impairment, presenting for evaluation of left PCA territory acute CVA.  PAIN:  Are you having pain? No  PRECAUTIONS: Fall  WEIGHT BEARING RESTRICTIONS: No  OBJECTIVE:  TODAY'S TREATMENT:     Vitals (LUE in sitting prior to activity):   Today's Vitals   07/20/22 1322  BP: 113/87  -Verbally reviewed HEP and confirmed pt has printout from session prior. -FGA:  [facility] PT Assessment - 07/20/22 1329       Functional Gait  Assessment   Gait assessed  Yes    Gait Level Surface Walks 20 ft in less than 7 sec but greater than 5.5 sec, uses assistive device, slower speed, mild gait deviations, or deviates 6-10 in outside of the 12 in walkway width.    Change in Gait Speed Able to change speed, demonstrates mild gait deviations, deviates 6-10 in outside of the 12 in walkway width, or no gait deviations, unable  to achieve a major change in velocity, or uses a change in velocity, or uses an assistive device.    Gait with Horizontal Head Turns Performs head turns smoothly with slight change in gait velocity (eg, minor disruption to smooth gait path), deviates 6-10 in outside 12 in walkway width, or uses an assistive device.    Gait with Vertical Head Turns Performs task with slight change in gait velocity (eg, minor disruption to  smooth gait path), deviates 6 - 10 in outside 12 in walkway width or uses assistive device    Gait and Pivot Turn Pivot turns safely in greater than 3 sec and stops with no loss of balance, or pivot turns safely within 3 sec and stops with mild imbalance, requires small steps to catch balance.    Step Over Obstacle Is able to step over one shoe box (4.5 in total height) but must slow down and adjust steps to clear box safely. May require verbal cueing.    Gait with Narrow Base of Support Ambulates less than 4 steps heel to toe or cannot perform without assistance.    Gait with Eyes Closed Walks 20 ft, slow speed, abnormal gait pattern, evidence for imbalance, deviates 10-15 in outside 12 in walkway width. Requires more than 9 sec to ambulate 20 ft.    Ambulating Backwards Walks 20 ft, uses assistive device, slower speed, mild gait deviations, deviates 6-10 in outside 12 in walkway width.    Steps Alternating feet, must use rail.    Total Score 16    FGA comment: 16/30 = high fall risk            -5xSTS no UE support:  21.19 sec -10MWT w/ SPC:  13.91 sec = 0.72 m/sec OR 2.37 ft/sec -10MWT w/o AD:  11.75 sec = 0.85 m/sec OR 2.81 ft/sec RAMP:  Level of Assistance: Modified independence and SBA Assistive device utilized: Single point cane and None Ramp Comments: Pt is mod I w/ SPC and SBA w/o due to imbalance during turn w/o LOB as pt tends to initiate turning w/o being completely on the platform.  Edu on safety.  CURB:  Level of Assistance: SBA and CGA Assistive device utilized: Single point cane and None Curb Comments: Performed x2, once w/ AD, once w/o w/ pt continuing to initiate turn and descent w/o having stance leg fully on platform, communicated concern to both patient and wife and they verbalized understanding.  Instability on descent w/o AD requiring CGA.  STAIRS:  Level of Assistance: Modified independence  Stair Negotiation Technique: Alternating Pattern  Forwards with Single  Rail on Right  Number of Stairs: 4   Height of Stairs: 6"  Comments: Pt mod I w/ use of single rail on right, mild lateral sway bilateral during stance, no overt LOB.  GAIT: Gait pattern: step through pattern, decreased arm swing- Left, decreased stride length, knee flexed in stance- Right, knee flexed in stance- Left, shuffling, trendelenburg, lateral lean- Left, narrow BOS, poor foot clearance- Right, and poor foot clearance- Left Distance walked: 200' Assistive device utilized: Single point cane Level of assistance: SBA and CGA Comments: Pt reports low back feeling weak indicating he needs to return indoors during sidewalk ambulation.  He continues to be limited by low back pain and fatigue w/ ambulatory distance remaining safest w/ SPC.  When prompted to ambulate w/o AD bilateral trunk lean noted, when amulating w/ SPC he leans to left only.     PATIENT EDUCATION: Education details: Continue HEP,  monitoring BP, progress towards goals, and discharge plan.  Discussed safety w/ cane for increased distances and unlevel surfaces. Person educated: Patient and Spouse Education method: Explanation, Demonstration, and Verbal cues Education comprehension: verbalized understanding and needs further education  HOME EXERCISE PROGRAM: Access Code: [email or web address] URL: [email or web address] Date: 07/18/2022 Prepared by: Elease Etienne  Exercises - Corner Balance Feet Together With Eyes Closed  - 1 x daily - 5 x weekly - 1 sets - 2-3 reps - 30-45 seconds hold - Supine Bridge  - 1 x daily - 4-5 x weekly - 2 sets - 10 reps - Sit to Stand with Arms Crossed  - 1 x daily - 5 x weekly - 1-2 sets - 8 reps - Lower Trunk Rotations  - 1 x daily - 4 x weekly - 2 sets - 15-20 reps - Seated Hamstring Stretch  - 1 x daily - 5 x weekly - 1 sets - 2-3 reps - 45 seconds hold  GOALS: Goals reviewed with patient? Yes  SHORT TERM GOALS: Target date: 05/26/22  Pt will be IND with initial HEP in order to  indicate improved functional mobility and dec fall risk. Baseline:  Pt states compliance and no issues (1/18) Goal status: MET  2.  Pt will improve gait speed to >/=2.62 ft/sec w/ SPC in order to indicate dec fall risk.  Baseline: 2.00 ft/sec; 1.90 ft/sec (1/18) Goal status: NOT MET  3.  Pt will ambulate 79' without at mod I level with only minimal Trendelenburg in order to indicate more independent household ambulation.   Baseline: ongoing compensated right trendelenburg x200' SBA (1/18) Goal status: IN PROGRESS  4.  Pt will improve FGA to >/=18/30 in order to indicate dec fall risk.   Baseline: 12/30; 11/30 (1/23) Goal status: NOT MET  5.  Pt will improve 5TSS to </=30 secs without UE support only in order to indicate dec fall risk and improved functional strength.   Baseline: approx 37 secs with hands on lap and mild anterior LOB; 26.59 sec no UE support or LOB (1/18) Goal status: MET  6.  Pt will negotiate up/down 4 steps with single rail/cane, up/down ramp and curb and ambulate x 500' outdoors over unlevel paved surfaces at S level in order to indicate improved community mobility.   Baseline: 4 stairs right rail, curb and ramp SBA (1/18); pt declines to ambulate outside due to cold weather (1/23) Goal status: IN PROGRESS  UPDATED LONG TERM GOALS: Target date: 07/22/22  Pt will be IND with final HEP in order to indicate improved functional mobility and dec fall risk. Baseline: Pt independent w/ finalized HE (3/7). Goal status: MET  2.  Pt will improve FGA to >/=23/30 in order to indicate dec fall risk.   Baseline: 13/30 06/22/22; 16/30 (3/7) Goal status: NOT MET  3.  Pt will improve 5TSS to </=20 secs without UE support only in order to indicate dec fall risk and improved functional strength.   Baseline: 27.22 with hands on lap then use of arm rests; 21.19 sec w/o UE support (3/7) Goal status: PARTIALLY MET  4.  Pt will improve gait speed to >/=2.62 ft/sec w/o AD in order  to indicate dec fall risk.  Baseline: 2.38 ft/sec 06/22/22; 2.81 ft/sec no AD (3/7) Goal status: MET   5.  Pt will negotiate up/down 4 steps with single rail, up/down ramp and curb and ambulate x 500' outdoors over unlevel paved surfaces without device at S level in order to indicate improved community  mobility.   Baseline: 4 stairs R rail no AD mod I, up/down ramp SBA no AD, curb step SBA-CGA no AD, 200' on sidewalk SBA-CGA w/ SPC Goal status: PARTIALLY MET   ASSESSMENT:  CLINICAL IMPRESSION: Assessed LTGs this visit in preparation for discharge today as patient has leveled off in progress and is appropriate for self-management at home with partner.  He partially or fully met 4 of 5 goals and made modest progress towards goal #2.  He has a well established HEP which he is intermittently compliant to.  His dynamic balance remains impaired related to low back pain and LE weakness indicated by score of 16/30 on FGA.  He completes his 5xSTS in 21.19 seconds w/o UE support which is significantly improved from assessment prior.  He ambulates quicker today without the cane than with at a speed of 2.81 ft/sec (0.85 m/sec) indicating he is at a community ambulator level.  He can navigate stairs w/ a rail at SBA level and no AD, but is safest for increased outdoor distances, curb and ramp w/ an AD at SBA level.  Overall, he has made good progress towards goals and will be discharged from outpatient PT services at this time.  OBJECTIVE IMPAIRMENTS: Abnormal gait, decreased activity tolerance, decreased balance, decreased endurance, decreased mobility, decreased strength, impaired perceived functional ability, and postural dysfunction.   ACTIVITY LIMITATIONS: carrying, lifting, bending, standing, squatting, stairs, transfers, and locomotion level  PARTICIPATION LIMITATIONS: meal prep, cleaning, medication management, personal finances, driving, and community activity  PERSONAL FACTORS: 3+ comorbidities:  see above  are also affecting patient's functional outcome.   REHAB POTENTIAL: Good  CLINICAL DECISION MAKING: Evolving/moderate complexity  EVALUATION COMPLEXITY: Moderate  PLAN:  PT FREQUENCY: 2x/week  PT DURATION: 4 weeks  PLANNED INTERVENTIONS: Therapeutic exercises, Therapeutic activity, Neuromuscular re-education, Balance training, Gait training, Patient/Family education, Self Care, Stair training, Vestibular training, DME instructions, and Aquatic Therapy  PLAN FOR NEXT SESSION: N/A  Elease Etienne, PT, [place] [address] [place], [place], [address] Phone: [phone]   Fax:  [phone] 07/20/22, 3:22 PM

## 2022-07-20 NOTE — Therapy (Signed)
OUTPATIENT OCCUPATIONAL THERAPY NEURO TREATMENT AND DISCHARGE SUMMARY  Patient Name: Antonio Adams MRN: [id number] DOB:1946-02-08, 77 y.o., male Today's Date: 07/11/2022  PCP: Dr. Sharlet Salina REFERRING PROVIDER: Dr. Sharlet Salina  END OF SESSION:  OT End of Session - 07/11/22 0946     Visit Number 12   Number of Visits 25    Date for OT Re-Evaluation 07/26/22    Authorization Type [employer]    Authorization - Visit Number 11    Authorization - Number of Visits 20    Progress Note Due on Visit 20    OT Start Time 0943   Pt arrived 10 min late   OT Stop Time 1015    OT Time Calculation (min) 32 min    Activity Tolerance Patient tolerated treatment well    Behavior During Therapy WFL for tasks assessed/performed               Past Medical History:  Diagnosis Date   ADENOCARCINOMA, PROSTATE, GLEASON GRADE 6 12/21/2009   prostate cancer   ALLERGIC RHINITIS 03/16/2007   Allergy    Anxiety    Arthritis    back    BACK PAIN WITH RADICULOPATHY 11/23/2008   Cancer of kidney ([place])    partial right kidney removed   CARPAL TUNNEL SYNDROME, BILATERAL 03/16/2007   Chronic back pain    Constipation    takes Senokot daily   Depression    occasionally   DIABETES MELLITUS, TYPE II 03/16/2007   takes Januvia and MEtformin daily   GASTROINTESTINAL HEMORRHAGE, HX OF 03/16/2007   GERD 03/16/2007   takes Omeprazole daily   Glaucoma    mild - no eye drops   Hemorrhoids    History of colon polyps    HYPERLIPIDEMIA 03/16/2007   takes Crestor daily   HYPERTENSION 03/16/2007   takes Diltiazem and Lisinopril daily    Kidney cancer, primary, with metastasis from kidney to other site [facility])    LEG CRAMPS 05/22/2007   Nocturia    Overactive bladder    PARESTHESIA 05/21/2007   Proctitis    PTSD (post-traumatic stress disorder)    wakes up may be dreaming of fighting   Rectal bleeding    Dr.Norins has explained its from the Radiation that he has received   Rectal bleeding    Renal cell  carcinoma ([place])    Sleep apnea    uses CPAP nightly   Stomach cancer ([place])    Urinary frequency    takes Toviaz daily   Past Surgical History:  Procedure Laterality Date   ADRENALECTOMY Right 02/26/2020   Procedure: OPEN RIGHT RENAL CELL METASTATECTOMY AND  ADRENALECTOMY;  Surgeon: [name], MD;  Location: [facility];  Service: Urology;  Laterality: Right;   [place], 2008   repeat surgery; ESI '08   Secor     bilateral   CERVICAL FUSION     COLONOSCOPY     colonosocpy     ESOPHAGOGASTRODUODENOSCOPY     ED   FLEXIBLE SIGMOIDOSCOPY N/A 01/08/2017   Procedure: FLEXIBLE SIGMOIDOSCOPY;  Surgeon: Irene Shipper, MD;  Location: [place];  Service: Endoscopy;  Laterality: N/A;   HOT HEMOSTASIS N/A 01/08/2017   Procedure: HOT HEMOSTASIS (ARGON PLASMA COAGULATION/BICAP);  Surgeon: Irene Shipper, MD;  Location: [facility];  Service: Endoscopy;  Laterality: N/A;   IR RADIOLOGIST EVAL & MGMT  12/17/2019   IR RADIOLOGIST EVAL & MGMT  07/01/2020   IR RADIOLOGIST EVAL & MGMT  08/25/2020   LUMBAR LAMINECTOMY  10/30/2011   Procedure: MICRODISCECTOMY LUMBAR LAMINECTOMY;  Surgeon: Jessy Oto, MD;  Location: [place];  Service: Orthopedics;  Laterality: Right;  Right L4-5 and L5-S1 Microdiscectomy   LUMBAR LAMINECTOMY  06/17/2012   Procedure: MICRODISCECTOMY LUMBAR LAMINECTOMY;  Surgeon: Jessy Oto, MD;  Location: [place];  Service: Orthopedics;  Laterality: N/A;  Right L5-S1 microdiscectomy   OPEN PARTIAL HEPATECTOMY  N/A 02/26/2020   Procedure: OPEN PARTIAL HEPATECTOMY, CHOLECYSTECTOMY;  Surgeon: Stark Klein, MD;  Location: [facility];  Service: General;  Laterality: N/A;   POLYPECTOMY     PROSTATE SURGERY  march 2012   seed implant   RADIOFREQUENCY ABLATION N/A 07/21/2020   Procedure: CT MICROWAVE ABLATION;  Surgeon: Criselda Peaches, MD;  Location: [facility];  Service: Anesthesiology;  Laterality: N/A;   ROBOTIC ASSITED PARTIAL NEPHRECTOMY Right 09/08/2015   Procedure:  XI ROBOTIC ASSITED RIGHT PARTIAL NEPHRECTOMY;  Surgeon: [name], MD;  Location: [facility];  Service: Urology;  Laterality: Right;  Clamp on: 1033 Clamp off: 1103 Total Clamp Time: 30 minutes   SHOULDER ARTHROSCOPY WITH SUBACROMIAL DECOMPRESSION Left 01/20/2016   Procedure: LEFT SHOULDER ARTHROSCOPY WITH EXTENSIVE  DEBRIDEMENT, ACROMIOPLASTY;  Surgeon: Ninetta Lights, MD;  Location: [place];  Service: Orthopedics;  Laterality: Left;   SHOULDER SURGERY  LFT   Stress Cardiolite  12/24/2003   negative for ischemia   UPPER ESOPHAGEAL ENDOSCOPIC ULTRASOUND (EUS)  04/18/2016   [facility]   UPPER GASTROINTESTINAL ENDOSCOPY     WOUND EXPLORATION     management of a wound that he sustained in the [place]   Patient Active Problem List   Diagnosis Date Noted   Vaccine for VZV (varicella-zoster virus) 06/12/2022   Hx of completed stroke 04/28/2022   Abnormal brain CT 04/07/2022   Encounter for general adult medical examination with abnormal findings 01/23/2022   Anemia due to zinc deficiency 12/20/2021   Low back pain 12/15/2021   Symptomatic anemia 12/15/2021   Cancer related pain 12/15/2021   Chronic heart failure with preserved ejection fraction (HFpEF) ([place]) 08/12/2021   Tinea cruris 08/12/2021   Anemia due to chronic kidney disease 07/20/2021   CKD (chronic kidney disease) stage 5, GFR less than 15 ml/min (HCC) 05/24/2021   BPH (benign prostatic hyperplasia) 05/24/2021   Diabetic renal disease ([place]) 05/20/2021   Dry eye syndrome of bilateral lacrimal glands 05/20/2021   Senile nuclear sclerosis 05/20/2021   Vitamin D deficiency 05/20/2021   Vitreous degeneration, unspecified eye 05/20/2021   Intrinsic eczema 04/18/2021   Atherosclerosis of aorta ([place]) 12/30/2020   Mixed conductive and sensorineural hearing loss of left ear with restricted hearing of right ear 09/01/2020   Tinnitus of both ears 05/20/2020   Metastatic renal cell carcinoma ([place]) 02/26/2020    Diabetic polyneuropathy associated with type 2 diabetes mellitus ([place]) 12/18/2019   Murmur, cardiac 10/08/2018   DDD (degenerative disc disease), cervical 10/08/2018   OAB (overactive bladder) 04/04/2018   Seasonal allergic rhinitis due to pollen 11/01/2016   Microalbuminuria due to type 2 diabetes mellitus ([place]) 07/27/2016   Varicose veins of both lower extremities 08/06/2015   Varicose veins of lower extremities with ulcer ([place]) 06/16/2015   Internal hemorrhoid, bleeding 08/03/2014   Post traumatic stress disorder (PTSD) 01/29/2014   Spinal stenosis of lumbar region with radiculopathy 10/30/2011    Class: Chronic   Obstructive sleep apnea 11/24/2010   Obesity 11/24/2010   Routine general medical examination at a health care facility  11/24/2010   ADENOCARCINOMA, PROSTATE, GLEASON GRADE 6 12/21/2009   Essential hypertension 03/16/2007   GERD 03/16/2007    ONSET DATE: 04/07/22  REFERRING DIAG: Diagnosis Z86.73 (ICD-10-CM) - Personal history of transient ischemic attack (TIA), and cerebral infarction without residual deficits  THERAPY DIAG:  Visuospatial deficit  Other lack of coordination  Attention and concentration deficit  Frontal lobe and executive function deficit  Unsteadiness on feet  Rationale for Evaluation and Treatment: Rehabilitation  SUBJECTIVE:   SUBJECTIVE STATEMENT: No pain, no falls. No changes since last week  Pt accompanied by: significant other fiancee [name]  PERTINENT HISTORY: 77 y.o. R-handed male with history of DM2 with diabetic polyneuropathy, OSA on nightly CPAP, PTSD, history of lumbar stenosis with chronic back pain, metastatic renal carcinoma with lymphadenopathy and questionable hepatic involvement noted in 2022, not on chemo due to poor tolerance to the medications, and without significant progression since May 2023, only on surveillance imaging, stage IV, eczema, vitamin D deficiency, CKD stage V refusing HD, BPH, anemia of CKD  Zinc  deficiency, chronic heart failure, mild cognitive impairment, s/p left PCA territory acute CVA on 04/07/22   DIAGNOSTIC FINDINGS:  MRI of the brain showing a left PCA large infarct and punctate right PCA infarct MRA remarkable for left PCA P3 branch occlusion, ICA siphon atherosclerosis with possible tandem significant stenosis of the right siphon  PRECAUTIONS: Fall, hx of CA  WEIGHT BEARING RESTRICTIONS: No  PAIN:  Are you having pain? No  FALLS: Has patient fallen in last 6 months? Yes. Number of falls 1  LIVING ENVIRONMENT: Lives with: lives alone, fiancee is there most of the time Lives in: House/apartment Stairs: 1 level stairs to enter Has following equipment at home: Single point cane  PLOF: Independent  PATIENT GOALS: to get right  OBJECTIVE:   HAND DOMINANCE: Right  ADLs: Overall ADLs: fall risk in standing Transfers/ambulation related to [place] Eating: mod I Grooming: mod I UB Dressing: mod I  LB Dressing: supervision, increased time required, therapist recommends pt sits for LB dressing due to fall risk Toileting: mod I using cane Bathing: supervision Tub Shower transfers: min A currently standing in shower, no grab bars, using tub shower does not have seat Equipment: none  IADLs: Shopping: has assistance Light housekeeping: fiancee handles Meal Prep: does not cook Community mobility: ambulates with cane, supervision-minguard, unsteady and walks close to items Medication management: fiancee is helping with meds Financial management: fiancee is assisting  Handwriting: 100% legible  MOBILITY STATUS:  ambulates with minguard with cane , some unsteadiness and walks close to walls and doorways   FUNCTIONAL OUTCOME MEASURES: FOTO: TBA  UPPER EXTREMITY ROM:  remaining AROM is WFLS   Active ROM Right eval Left eval  Shoulder flexion 105 105  Shoulder abduction    Shoulder adduction    Shoulder extension    Shoulder internal rotation     Shoulder external rotation    Elbow flexion    Elbow extension -15 -20  Wrist flexion    Wrist extension    Wrist ulnar deviation    Wrist radial deviation    Wrist pronation    Wrist supination    (Blank rows = not tested)  UPPER EXTREMITY MMT:     MMT Right eval Left eval  Shoulder flexion 4-/5 3+/5  Shoulder abduction    Shoulder adduction    Shoulder extension    Shoulder internal rotation    Shoulder external rotation    Middle trapezius    Lower  trapezius    Elbow flexion 4+/5 4+/5  Elbow extension 4/5 4/5  Wrist flexion    Wrist extension    Wrist ulnar deviation    Wrist radial deviation    Wrist pronation    Wrist supination    (Blank rows = not tested)  HAND FUNCTION: Grip strength: Right: 46.9 lbs; Left: 40.3 lbs  COORDINATION: 9 Hole Peg test: Right: 44 sec; Left: 40 sec   SENSATION: Light touch: Impaired  for ring and small finger R hand ulnar distribution, reports this was since CA treatment  EDEMA: n/a   COGNITION: Overall cognitive status: Impaired Decreased short term memory, delayed processing and decreased awareness  VISION:  Baseline vision: Wears glasses all the time   VISION ASSESSMENT: Number cancellation task 1.5 M : 7/80 missed, 91% Visual Fields: per hospital records R homonymous hemianopsia  Vision to be further assessed in a functional context  Patient has difficulty with following activities due to following visual impairments: ambulates close to walls and doorways on R side   OBSERVATIONS: Pt with minimal verbalizations and poor awareness of deficits   TODAY'S TREATMENT:      07/20/22:  Completed card sorting task with min cueing initially and increased time.  Switched task to sorting via suits, and patient needing mod cueing.  Patient had difficulty discerning discreet information from cards - which card is missing based on suit (field of 4)  Patient using compensatory strategies to scan left/right in both tabletop  and environmental conditions.  Reviewed remaining OT goals and patient has met and even exceeded his long term goals.  Patient and fiancee agreeable to early discharge.       PATIENT EDUCATION: Education details: review of red theraband HEP, 10 reps each Person educated: Patient and Spouse Education method: Explanation, Demonstration, min-mod v.c Education comprehension: verbalized understanding, returned demonstration,needs v.c, pt's spouse can assist    HOME EXERCISE PROGRAM: 05/30/22: Visual scanning strategies, coordination HEP 06/01/22: cane HEP   06/06/22: memory strategies 06/20/22: Red theraband HEP   GOALS: Goals reviewed with patient? Yes  SHORT TERM GOALS: Target date: 06/02/22  I with initial HEP  Goal status: MET w/ cues  2.  Pt will perform tub transfers safely mod I using tub bench prn. Baseline: doe not have tub bench yet, currently supervision, min A Goal status: MET  3.  Pt/caregiver will verbalize understanding of compensations for visual deficits  Goal status: MET  4.  Pt will demonstrate improved fine motor coordination  for ADLs with RUE as evidenced by decreasing 9 hole peg test score by 3 secs Baseline: 9 Hole Peg test: Right: 44 sec; Left: 40 sec Goal status:  met 39.09 secs  07/04/22  5.   Pt will navigate a busy environment and locate items with 75% accuracy without bumping into items. Baseline: nearly bumps into wall and doorway on right side Goal status: IN PROGRESS (56% accuracy)   6.  Pt will verbalize understanding of compensations for short term memory deficits. Baseline:  Goal status: PARTIALLY MET - issued and reviewed x2, however pt still has a hard time recalling strategies   LONG TERM GOALS: Target date: 07/25/21  I with updated HEP Baseline:  Goal status: MET  2.  Pt will navigate a busy environment and locate items with 90% accuracy. Baseline: nearly bumps into wall and doorway on right side Goal status:  ongoing   3.  Pt will  perform all basic ADLs mod I with good safety awareness. Baseline: supervision,  min A Goal status: MET mod I  4.  Pt will demonstrate improved fine motor coordination with RUE  for ADLs as evidenced by decreasing 9 hole peg test score by 6 secs form initial eval. Baseline: 9 Hole Peg test: Right: 44 sec; Left: 40 sec Goal status: in progress, 39.09 secs  07/04/22  37.66 seconds 07/20/22 - MET!  5.  Pt will demonstrate ability to retrieve lightweight object at 110* shoulder flexion and -10 elbow extension with RUE Baseline: shoulder flexion 105, elbow extension -20 Goal status: MET  6.  Pt will demonstrate ability to retrieve lightweight object at 110* shoulder flexion and -15 elbow extension with LUE Baseline: shoulder flexion 105, elbow extension -20 Goal status: MET  ASSESSMENT:  CLINICAL IMPRESSION: Patient has shown improved functional performance with ADL'S.  Patient has met all long term goals and is agreeable to discharge.  PERFORMANCE DEFICITS: in functional skills including ADLs, IADLs, coordination, dexterity, proprioception, sensation, tone, ROM, strength, flexibility, Fine motor control, Gross motor control, mobility, balance, endurance, decreased knowledge of precautions, decreased knowledge of use of DME, vision, and UE functional use, cognitive skills including energy/drive, learn, memory, problem solving, safety awareness, thought, and understand, and psychosocial skills including coping strategies, environmental adaptation, habits, interpersonal interactions, and routines and behaviors.   IMPAIRMENTS: are limiting patient from ADLs, IADLs, rest and sleep, play, leisure, and social participation.   CO-MORBIDITIES: may have co-morbidities  that affects occupational performance. Patient will benefit from skilled OT to address above impairments and improve overall function.  MODIFICATION OR ASSISTANCE TO COMPLETE EVALUATION: No modification of tasks or assist necessary to  complete an evaluation.  OT OCCUPATIONAL PROFILE AND HISTORY: Detailed assessment: Review of records and additional review of physical, cognitive, psychosocial history related to current functional performance.  CLINICAL DECISION MAKING: LOW - limited treatment options, no task modification necessary  REHAB POTENTIAL: Good  EVALUATION COMPLEXITY: Low   PLAN:  OT FREQUENCY: 2x/week  OT DURATION: 12 weeks  PLANNED INTERVENTIONS: self care/ADL training, therapeutic exercise, therapeutic activity, neuromuscular re-education, manual therapy, passive range of motion, gait training, balance training, stair training, functional mobility training, aquatic therapy, splinting, paraffin, fluidotherapy, moist heat, cryotherapy, contrast bath, patient/family education, cognitive remediation/compensation, visual/perceptual remediation/compensation, energy conservation, coping strategies training, DME and/or AE instructions, and Re-evaluation  RECOMMENDED OTHER SERVICES: PT, ST  CONSULTED AND AGREED WITH PLAN OF CARE: Patient and family member/caregiver  PLAN FOR NEXT SESSION: discharge  Antony Salmon, OTR/L 07/20/22 3:02 PM Phone: [phone] Fax: [phone]

## 2022-07-24 ENCOUNTER — Encounter (HOSPITAL_COMMUNITY)
Admission: RE | Admit: 2022-07-24 | Discharge: 2022-07-24 | Disposition: A | Discharge status: Home / Self Care | Payer: Medicare Other | Source: Ambulatory Visit | Attending: Nephrology | Admitting: Nephrology

## 2022-07-24 VITALS — BP 175/91 | HR 63 | Temp 97.8°F | Resp 17

## 2022-07-24 DIAGNOSIS — N185 Chronic kidney disease, stage 5: Secondary | ICD-10-CM | POA: Diagnosis not present

## 2022-07-24 LAB — POCT HEMOGLOBIN-HEMACUE: Hemoglobin: 8.8 g/dL — ABNORMAL LOW (ref 13.0–17.0)

## 2022-07-24 MED ORDER — EPOETIN ALFA-EPBX 40000 UNIT/ML IJ SOLN
INTRAMUSCULAR | Status: AC
  Filled 2022-07-24: qty 1

## 2022-07-24 MED ORDER — EPOETIN ALFA-EPBX 40000 UNIT/ML IJ SOLN
30000.0000 [IU] | INTRAMUSCULAR | Status: DC
  Administered 2022-07-24: 30000 [IU] via SUBCUTANEOUS

## 2022-07-25 ENCOUNTER — Ambulatory Visit: Payer: Medicare Other | Admitting: Occupational Therapy

## 2022-07-25 ENCOUNTER — Ambulatory Visit: Payer: Medicare Other | Admitting: Physical Therapy

## 2022-07-26 DIAGNOSIS — N189 Chronic kidney disease, unspecified: Secondary | ICD-10-CM | POA: Diagnosis not present

## 2022-07-26 DIAGNOSIS — N185 Chronic kidney disease, stage 5: Secondary | ICD-10-CM | POA: Diagnosis not present

## 2022-07-26 DIAGNOSIS — E875 Hyperkalemia: Secondary | ICD-10-CM | POA: Diagnosis not present

## 2022-07-26 DIAGNOSIS — N2581 Secondary hyperparathyroidism of renal origin: Secondary | ICD-10-CM | POA: Diagnosis not present

## 2022-07-26 DIAGNOSIS — C649 Malignant neoplasm of unspecified kidney, except renal pelvis: Secondary | ICD-10-CM | POA: Diagnosis not present

## 2022-07-26 DIAGNOSIS — D631 Anemia in chronic kidney disease: Secondary | ICD-10-CM | POA: Diagnosis not present

## 2022-07-26 DIAGNOSIS — E785 Hyperlipidemia, unspecified: Secondary | ICD-10-CM | POA: Diagnosis not present

## 2022-07-26 DIAGNOSIS — E1122 Type 2 diabetes mellitus with diabetic chronic kidney disease: Secondary | ICD-10-CM | POA: Diagnosis not present

## 2022-07-26 DIAGNOSIS — I120 Hypertensive chronic kidney disease with stage 5 chronic kidney disease or end stage renal disease: Secondary | ICD-10-CM | POA: Diagnosis not present

## 2022-07-27 LAB — LAB REPORT - SCANNED: EGFR: 6

## 2022-08-07 ENCOUNTER — Encounter (HOSPITAL_COMMUNITY)
Admission: RE | Admit: 2022-08-07 | Discharge: 2022-08-07 | Disposition: A | Discharge status: Home / Self Care | Payer: Medicare Other | Source: Ambulatory Visit | Attending: Nephrology | Admitting: Nephrology

## 2022-08-07 ENCOUNTER — Ambulatory Visit (HOSPITAL_BASED_OUTPATIENT_CLINIC_OR_DEPARTMENT_OTHER): Payer: Medicare Other | Admitting: Cardiology

## 2022-08-07 ENCOUNTER — Encounter (HOSPITAL_BASED_OUTPATIENT_CLINIC_OR_DEPARTMENT_OTHER): Payer: Self-pay | Admitting: Cardiology

## 2022-08-07 VITALS — BP 168/88 | HR 73 | Ht 75.0 in | Wt 204.5 lb

## 2022-08-07 VITALS — BP 167/91 | HR 75 | Temp 97.7°F | Resp 17

## 2022-08-07 DIAGNOSIS — I5032 Chronic diastolic (congestive) heart failure: Secondary | ICD-10-CM | POA: Diagnosis not present

## 2022-08-07 DIAGNOSIS — I2510 Atherosclerotic heart disease of native coronary artery without angina pectoris: Secondary | ICD-10-CM | POA: Diagnosis not present

## 2022-08-07 DIAGNOSIS — N185 Chronic kidney disease, stage 5: Secondary | ICD-10-CM

## 2022-08-07 DIAGNOSIS — I10 Essential (primary) hypertension: Secondary | ICD-10-CM

## 2022-08-07 DIAGNOSIS — R600 Localized edema: Secondary | ICD-10-CM

## 2022-08-07 LAB — POCT HEMOGLOBIN-HEMACUE: Hemoglobin: 9.9 g/dL — ABNORMAL LOW (ref 13.0–17.0)

## 2022-08-07 LAB — IRON AND TIBC
Iron: 95 ug/dL (ref 45–182)
Saturation Ratios: 34 % (ref 17.9–39.5)
TIBC: 281 ug/dL (ref 250–450)
UIBC: 186 ug/dL

## 2022-08-07 LAB — FERRITIN: Ferritin: 492 ng/mL — ABNORMAL HIGH (ref 24–336)

## 2022-08-07 MED ORDER — EPOETIN ALFA-EPBX 40000 UNIT/ML IJ SOLN
30000.0000 [IU] | INTRAMUSCULAR | Status: DC
  Administered 2022-08-07: 30000 [IU] via SUBCUTANEOUS

## 2022-08-07 MED ORDER — EPOETIN ALFA-EPBX 40000 UNIT/ML IJ SOLN
INTRAMUSCULAR | Status: AC
  Filled 2022-08-07: qty 1

## 2022-08-07 NOTE — Progress Notes (Signed)
Cardiology Office Note:    Date:  08/07/2022   ID:  Antonio Adams, DOB [birth year], MRN [id number]  PCP:  [name], MD  Cardiologist:  Buford Dresser, MD  Referring MD: [name], MD   Cc: follow up  History of Present Illness:    Antonio Adams is a 77 y.o. male with a hx of CKD stage 5, hypertension, and hyperlipidemia, who is seen for follow-up.  CV history: He was admitted to the hospital 01/07/2022. He reported that for the last two weeks he developed increasing exertional dyspnea. He was found to be profoundly anemic. His symptoms resolved with transfusion, his anemia appeared to be acute on chronic secondary to advanced chronic kidney disease. Troponins were elevated in the setting of CKD5 and stable. Echo performed was unremarkable with an EF of 60 to 65% and grade 1 diastolic dysfunction. He was discharged and recommended PCP and nephrology follow up.  Today: No specific concerns today. Blood pressure very elevated initially, doesn't check at home. Not sleeping well at night due to general worry. Takes naps during the day.   Swelling in legs is stable. Rare headaches, no blurry vision. No syncope. No palpitations. No chest pain. Breathing stable.   Has seen nephrology recently. He does not want dialysis. Told to stop taking Megace give risk of blood clots. Completed course of clopidogrel in February, now on aspirin alone.  Notes lability in blood pressure, has had low readings at physical therapy previously.  Past Medical History:  Diagnosis Date   ADENOCARCINOMA, PROSTATE, GLEASON GRADE 6 12/21/2009   prostate cancer   ALLERGIC RHINITIS 03/16/2007   Allergy    Anxiety    Arthritis    back    BACK PAIN WITH RADICULOPATHY 11/23/2008   Cancer of kidney ([place])    partial right kidney removed   CARPAL TUNNEL SYNDROME, BILATERAL 03/16/2007   Chronic back pain    Constipation    takes Senokot daily   Depression    occasionally   DIABETES  MELLITUS, TYPE II 03/16/2007   takes Januvia and MEtformin daily   GASTROINTESTINAL HEMORRHAGE, HX OF 03/16/2007   GERD 03/16/2007   takes Omeprazole daily   Glaucoma    mild - no eye drops   Hemorrhoids    History of colon polyps    HYPERLIPIDEMIA 03/16/2007   takes Crestor daily   HYPERTENSION 03/16/2007   takes Diltiazem and Lisinopril daily    Kidney cancer, primary, with metastasis from kidney to other site [facility])    LEG CRAMPS 05/22/2007   Nocturia    Overactive bladder    PARESTHESIA 05/21/2007   Proctitis    PTSD (post-traumatic stress disorder)    wakes up may be dreaming of fighting   Rectal bleeding    Dr.Norins has explained its from the Radiation that he has received   Rectal bleeding    Renal cell carcinoma ([place])    Sleep apnea    uses CPAP nightly   Stomach cancer ([place])    Urinary frequency    takes Toviaz daily    Past Surgical History:  Procedure Laterality Date   ADRENALECTOMY Right 02/26/2020   Procedure: OPEN RIGHT RENAL CELL METASTATECTOMY AND  ADRENALECTOMY;  Surgeon: [name], MD;  Location: [facility];  Service: Urology;  Laterality: Right;   [place], 2008   repeat surgery; ESI '08   CARPAL TUNNEL RELEASE     bilateral   CERVICAL FUSION  COLONOSCOPY     colonosocpy     ESOPHAGOGASTRODUODENOSCOPY     ED   FLEXIBLE SIGMOIDOSCOPY N/A 01/08/2017   Procedure: FLEXIBLE SIGMOIDOSCOPY;  Surgeon: Irene Shipper, MD;  Location: [facility] ENDOSCOPY;  Service: Endoscopy;  Laterality: N/A;   HOT HEMOSTASIS N/A 01/08/2017   Procedure: HOT HEMOSTASIS (ARGON PLASMA COAGULATION/BICAP);  Surgeon: Irene Shipper, MD;  Location: [facility] ENDOSCOPY;  Service: Endoscopy;  Laterality: N/A;   IR RADIOLOGIST EVAL & MGMT  12/17/2019   IR RADIOLOGIST EVAL & MGMT  07/01/2020   IR RADIOLOGIST EVAL & MGMT  08/25/2020   LUMBAR LAMINECTOMY  10/30/2011   Procedure: MICRODISCECTOMY LUMBAR LAMINECTOMY;  Surgeon: Jessy Oto, MD;  Location: [place];  Service: Orthopedics;  Laterality:  Right;  Right L4-5 and L5-S1 Microdiscectomy   LUMBAR LAMINECTOMY  06/17/2012   Procedure: MICRODISCECTOMY LUMBAR LAMINECTOMY;  Surgeon: Jessy Oto, MD;  Location: [place];  Service: Orthopedics;  Laterality: N/A;  Right L5-S1 microdiscectomy   OPEN PARTIAL HEPATECTOMY  N/A 02/26/2020   Procedure: OPEN PARTIAL HEPATECTOMY, CHOLECYSTECTOMY;  Surgeon: Stark Klein, MD;  Location: [facility];  Service: General;  Laterality: N/A;   POLYPECTOMY     PROSTATE SURGERY  march 2012   seed implant   RADIOFREQUENCY ABLATION N/A 07/21/2020   Procedure: CT MICROWAVE ABLATION;  Surgeon: Criselda Peaches, MD;  Location: [facility];  Service: Anesthesiology;  Laterality: N/A;   ROBOTIC ASSITED PARTIAL NEPHRECTOMY Right 09/08/2015   Procedure: XI ROBOTIC ASSITED RIGHT PARTIAL NEPHRECTOMY;  Surgeon: [name], MD;  Location: [facility];  Service: Urology;  Laterality: Right;  Clamp on: 1033 Clamp off: 1103 Total Clamp Time: 30 minutes   SHOULDER ARTHROSCOPY WITH SUBACROMIAL DECOMPRESSION Left 01/20/2016   Procedure: LEFT SHOULDER ARTHROSCOPY WITH EXTENSIVE  DEBRIDEMENT, ACROMIOPLASTY;  Surgeon: Ninetta Lights, MD;  Location: [place];  Service: Orthopedics;  Laterality: Left;   SHOULDER SURGERY  LFT   Stress Cardiolite  12/24/2003   negative for ischemia   UPPER ESOPHAGEAL ENDOSCOPIC ULTRASOUND (EUS)  04/18/2016   [facility]   UPPER GASTROINTESTINAL ENDOSCOPY     WOUND EXPLORATION     management of a wound that he sustained in the [employer]    Current Medications: Current Outpatient Medications on File Prior to Visit  Medication Sig   acetaminophen (TYLENOL) 500 MG tablet Take 1,000 mg by mouth every 6 (six) hours as needed for mild pain.   Apoaequorin (PREVAGEN EXTRA STRENGTH PO) Take 1 tablet by mouth daily.   aspirin EC 81 MG tablet Take 1 tablet (81 mg total) by mouth daily. Swallow whole.   calcitRIOL (ROCALTROL) 0.25 MCG capsule Take 0.25 mcg by mouth daily.   Cyanocobalamin (B-12  PO) Take 1,000 mcg by mouth daily.   cyclobenzaprine (FLEXERIL) 5 MG tablet Take 1 tablet (5 mg total) by mouth 2 (two) times daily as needed for muscle spasms.   DULoxetine (CYMBALTA) 20 MG capsule Take 1 capsule (20 mg total) by mouth daily.   ferrous sulfate 325 (65 FE) MG tablet Take 1 tablet (325 mg total) by mouth 2 (two) times daily. Patient needs office visit for further refills (Patient taking differently: Take 325 mg by mouth daily with breakfast.)   fluocinonide-emollient (LIDEX-E) 0.05 % cream Apply 1 Application topically 2 (two) times daily as needed (dry skin).   furosemide (LASIX) 40 MG tablet TAKE 1 TABLET BY MOUTH EVERY DAY   gemfibrozil (LOPID) 600 MG tablet Take 600 mg by mouth 2 (two) times daily  before a meal.   LOKELMA 10 g PACK packet Take 10 g by mouth daily.   metoprolol tartrate (LOPRESSOR) 25 MG tablet TAKE 1 TABLET BY MOUTH TWICE A DAY   mirtazapine (REMERON SOL-TAB) 15 MG disintegrating tablet Take 15 mg by mouth at bedtime.   Omega-3 Fatty Acids (FISH OIL PO) Take 1,000 mg by mouth 2 (two) times a week.   OVER THE COUNTER MEDICATION Take 1 tablet by mouth daily. Focus factor   pantoprazole (PROTONIX) 20 MG tablet Take 1 tablet (20 mg total) by mouth daily. (Patient taking differently: Take 20 mg by mouth daily as needed for heartburn or indigestion.)   polyvinyl alcohol (LIQUIFILM TEARS) 1.4 % ophthalmic solution Place 1 drop into both eyes daily as needed for dry eyes.   rosuvastatin (CRESTOR) 20 MG tablet Take 1 tablet (20 mg total) by mouth daily.   sildenafil (VIAGRA) 100 MG tablet Take 100 mg by mouth daily as needed.   sodium bicarbonate 650 MG tablet Take 2 tablets (1,300 mg total) by mouth 2 (two) times daily.   solifenacin (VESICARE) 5 MG tablet Take 1 tablet (5 mg total) by mouth daily.   tamsulosin (FLOMAX) 0.4 MG CAPS capsule TAKE 1 CAPSULE (0.4 MG TOTAL) BY MOUTH DAILY. (Patient taking differently: Take 0.4 mg by mouth daily.)   zinc gluconate 50 MG  tablet Take 1 tablet (50 mg total) by mouth daily.   No current facility-administered medications on file prior to visit.     Allergies:   Ace inhibitors, Gabapentin, Xyzal [levocetirizine], and Opdivo [nivolumab]   Social History   Tobacco Use   Smoking status: Never   Smokeless tobacco: Never  Vaping Use   Vaping Use: Never used  Substance Use Topics   Alcohol use: Not Currently    Alcohol/week: 0.0 standard drinks of alcohol   Drug use: No    Family History: family history includes Cancer (age of onset: 49) in his sister; Hypertension in an other family member. There is no history of Colon cancer, Esophageal cancer, Stomach cancer, Diabetes, Colon polyps, or Rectal cancer.  ROS:   Please see the history of present illness.  Additional pertinent ROS otherwise negative.  EKGs/Labs/Other Studies Reviewed:    The following studies were reviewed today:  Echo 01/07/2022: 1. Left ventricular ejection fraction, by estimation, is 60 to 65%. Left  ventricular ejection fraction by PLAX is 65 %. The left ventricle has  normal function. The left ventricle has no regional wall motion  abnormalities. Left ventricular diastolic  parameters are consistent with Grade I diastolic dysfunction (impaired  relaxation).   2. Right ventricular systolic function is low normal. The right  ventricular size is normal. Tricuspid regurgitation signal is inadequate  for assessing PA pressure.   3. The mitral valve is abnormal. Trivial mitral valve regurgitation.   4. The aortic valve is tricuspid. Aortic valve regurgitation is not  visualized. Mild aortic valve stenosis. Aortic valve area, by VTI measures  1.81 cm. Aortic valve mean gradient measures 8.0 mmHg. Aortic valve Vmax  measures 1.86 m/s. DI is 0.48, Peak  gradient 14 mmHg, based on LVOT diameter of 2.2 cm.   5. The inferior vena cava is normal in size with greater than 50%  respiratory variability, suggesting right atrial pressure of 3  mmHg.   Comparison(s): Changes from prior study are noted. 07/21/2021: LVEF 48%, global hypokinesis.    CT Chest 10/04/2021: IMPRESSION: 1. Enlarging retroperitoneal lymph node below the left renal vein, currently 2.6 cm  in short axis, compatible with malignancy. 2. Stable nonspecific right adrenal mass. Stable extensive scarring and irregularity in the right kidney upper pole and adjacent portion of the right kidney, much of this is likely therapy related although residual tumor cannot be excluded at this location on today's noncontrast exam. 3. Enlarging L3 inferior endplate lucent lesion, morphology favoring Schmorl's node, lytic metastatic lesion less likely. 4. Other imaging findings of potential clinical significance: Aortic Atherosclerosis (ICD10-I70.0). Coronary atherosclerosis. Prominent main pulmonary artery, possibly from pulmonary arterial hypertension. Lumbar spondylosis and degenerative disc disease causing left-sided impingement at L4-5 and L5-S1.   Echo 07/21/2021:  1. Left ventricular ejection fraction, by estimation, is 50%. Left  ventricular ejection fraction by 3D volume is 48 %. The left ventricle has  mildly decreased function. The left ventricle demonstrates global  hypokinesis. Left ventricular diastolic  parameters are consistent with Grade I diastolic dysfunction (impaired  relaxation).   2. Right ventricular systolic function is normal. The right ventricular  size is normal.   3. The mitral valve is normal in structure. No evidence of mitral valve  regurgitation. No evidence of mitral stenosis.   4. The aortic valve is tricuspid. Aortic valve regurgitation is not  visualized. Aortic valve sclerosis is present, with no evidence of aortic  valve stenosis.   5. The inferior vena cava is normal in size with greater than 50%  respiratory variability, suggesting right atrial pressure of 3 mmHg.   Comparison(s): No prior Echocardiogram.   CT Chest  05/06/2021 Cardiovascular: Heart is normal in size.  No pericardial effusion.   No evidence of thoracic aortic aneurysm. Mild atherosclerotic calcifications of the aortic arch.   Three vessel coronary atherosclerosis.    Atherosclerotic calcifications of the abdominal aorta and branch vessels.   IMPRESSION: Status post partial right nephrectomy, right adrenalectomy, and microwave ablation of the posterior right hepatic lobe metastasis.   2.8 cm lesion in the posterior right hepatic lobe, improved.   Nodularity in the right adrenalectomy bed and hepatorenal fossa, grossly unchanged.   Brachytherapy seeds in the prostate.   No evidence of metastatic disease in the chest.  Monitor 11/2018: Sinus rhythm Frequent episodes of daytime tachycardia are noted and likely represent sinus tachycardia No sustained arrhythmias   EKG:  EKG is personally reviewed.   04/07/22 (ER): SR at 72 bpm, early r wave progression  Recent Labs: 10/04/2021: TSH 2.577 01/07/2022: B Natriuretic Peptide 207.6 04/14/2022: ALT 13; BUN 45; Creatinine 7.28; Platelet Count 196; Potassium 3.5; Sodium 139 08/07/2022: Hemoglobin 9.9   Recent Lipid Panel    Component Value Date/Time   CHOL 219 (H) 04/08/2022 0321   TRIG 113 04/08/2022 0321   HDL 37 (L) 04/08/2022 0321   CHOLHDL 5.9 04/08/2022 0321   VLDL 23 04/08/2022 0321   LDLCALC 159 (H) 04/08/2022 0321    Physical Exam:    VS:  BP (!) 168/88   Pulse 73   Ht 6\' 3"  (1.905 m)   Wt 204 lb 8 oz (92.8 kg)   SpO2 99%   BMI 25.56 kg/m     Wt Readings from Last 3 Encounters:  08/07/22 204 lb 8 oz (92.8 kg)  06/12/22 193 lb (87.5 kg)  05/29/22 194 lb 6.4 oz (88.2 kg)    GEN: Well nourished, well developed in no acute distress HEENT: Normal, moist mucous membranes NECK: No JVD CARDIAC: regular rhythm, normal S1 and S2, no rubs or gallops. 2/6 systolic murmur. VASCULAR: Radial and DP pulses 2+ bilaterally. No carotid bruits  RESPIRATORY:  Clear  bilaterally without rales, wheezing or rhonchi  ABDOMEN: Soft, non-tender, non-distended MUSCULOSKELETAL:  Ambulates independently SKIN: Warm and dry, bilateral 1+ LE edema with compression stockings in place NEUROLOGIC:  Alert and oriented x 3. No focal neuro deficits noted. PSYCHIATRIC:  Normal affect    ASSESSMENT:    1. Chronic diastolic heart failure ([place])   2. CKD (chronic kidney disease) stage 5, GFR less than 15 ml/min (HCC)   3. Essential hypertension   4. Coronary artery calcification seen on CT scan   5. Bilateral leg edema      PLAN:    Low normal LVEF LE edema Coronary calcification on CT Hypertension Chronic kidney disease, stage 5 (last GFR 14) -appears largely euvolemic except for LE edema -EF 50%, low normal. He was started on metoprolol BID. We changed to carvedilol, but he is back on metoprolol (unclear why it was changed back) -given CKD 5, no room for ACEi/ARB/ARNI/MRA. -consider initiation of SGLT2i now that GFR limits have been changed, defer to nephrology -tolerating aspirin -We again discussed his preferences, as we did in the hospital. He again confirmed that he would not want dialysis under any circumstances. Given this, we will continue to proceed with the plan for no ischemic workup. He does not have any ischemic symptoms. Even if he had an abnormal nuclear stress test, he would not want cath, therefore will not pursue -continue atorvastatin  Will have him check home BP, bring log to follow up in several weeks. Given LE edema, would like to avoid vasodilators, but if BP remains elevated may not have a choice  Cardiac risk counseling and prevention recommendations: -recommend heart healthy/Mediterranean diet, with whole grains, fruits, vegetable, fish, lean meats, nuts, and olive oil. Limit salt. -recommend moderate walking, 3-5 times/week for 30-50 minutes each session. Aim for at least 150 minutes.week. Goal should be pace of 3 miles/hours, or walking  1.5 miles in 30 minutes -recommend avoidance of tobacco products. Avoid excess alcohol. -ASCVD risk score: The ASCVD Risk score (Arnett DK, et al., 2019) failed to calculate for the following reasons:   The patient has a prior MI or stroke diagnosis    Plan for follow up: 3-4 weeks with Laurann Montana, NP  Buford Dresser, MD, PhD, [facility]    Medication Adjustments/Labs and Tests Ordered: Current medicines are reviewed at length with the patient today.  Concerns regarding medicines are outlined above.   No orders of the defined types were placed in this encounter.  No orders of the defined types were placed in this encounter.  Patient Instructions  Medication Instructions:  The current medical regimen is effective;  continue present plan and medications.   *If you need a refill on your cardiac medications before your next appointment, please call your pharmacy*   Lab Work: None   Testing/Procedures: N/A   Follow-Up: At [facility], you and your health needs are our priority.  As part of our continuing mission to provide you with exceptional heart care, we have created designated Provider Care Teams.  These Care Teams include your primary Cardiologist (physician) and Advanced Practice Providers (APPs -  Physician Assistants and Nurse Practitioners) who all work together to provide you with the care you need, when you need it.  We recommend signing up for the patient portal called "MyChart".  Sign up information is provided on this After Visit Summary.  MyChart is used to connect with patients for Virtual Visits (Telemedicine).  Patients are able to view lab/test results, encounter notes, upcoming appointments, etc.  Non-urgent messages can be sent to your provider as well.   To learn more about what you can do with MyChart, go to [email or web address].    Your next appointment:   3 week(s)  Provider:   Laurann Montana, NP  Other  Instructions How to check blood pressure:  -sit comfortably in a chair, feet uncrossed and flat on floor, for 5-10 minutes  -arm ideally should rest at the level of the heart. However, arm should be relaxed and not tense (for example, do not hold the arm up unsupported)  -avoid exercise, caffeine, and tobacco for at least 30 minutes prior to BP reading  -don't take BP cuff reading over clothes (always place on skin directly)  -I prefer to know how well the medication is working, so I would like you to take your readings 1-2 hours after taking your blood pressure medication if possible   Check 1-2 times/day for the next few weeks and bring your log to the follow up visit.    Signed, Buford Dresser, MD PhD 08/07/2022     [place]

## 2022-08-07 NOTE — Patient Instructions (Addendum)
Medication Instructions:  The current medical regimen is effective;  continue present plan and medications.   *If you need a refill on your cardiac medications before your next appointment, please call your pharmacy*   Lab Work: None   Testing/Procedures: N/A   Follow-Up: At [facility], you and your health needs are our priority.  As part of our continuing mission to provide you with exceptional heart care, we have created designated Provider Care Teams.  These Care Teams include your primary Cardiologist (physician) and Advanced Practice Providers (APPs -  Physician Assistants and Nurse Practitioners) who all work together to provide you with the care you need, when you need it.  We recommend signing up for the patient portal called "MyChart".  Sign up information is provided on this After Visit Summary.  MyChart is used to connect with patients for Virtual Visits (Telemedicine).  Patients are able to view lab/test results, encounter notes, upcoming appointments, etc.  Non-urgent messages can be sent to your provider as well.   To learn more about what you can do with MyChart, go to [email or web address].    Your next appointment:   3 week(s)  Provider:   Laurann Montana, NP  Other Instructions How to check blood pressure:  -sit comfortably in a chair, feet uncrossed and flat on floor, for 5-10 minutes  -arm ideally should rest at the level of the heart. However, arm should be relaxed and not tense (for example, do not hold the arm up unsupported)  -avoid exercise, caffeine, and tobacco for at least 30 minutes prior to BP reading  -don't take BP cuff reading over clothes (always place on skin directly)  -I prefer to know how well the medication is working, so I would like you to take your readings 1-2 hours after taking your blood pressure medication if possible   Check 1-2 times/day for the next few weeks and bring your log to the follow up visit.

## 2022-08-21 ENCOUNTER — Encounter (HOSPITAL_COMMUNITY)
Admission: RE | Admit: 2022-08-21 | Discharge: 2022-08-21 | Disposition: A | Discharge status: Home / Self Care | Payer: Medicare Other | Source: Ambulatory Visit | Attending: Nephrology | Admitting: Nephrology

## 2022-08-21 VITALS — BP 166/97 | HR 76 | Temp 97.4°F | Resp 16

## 2022-08-21 DIAGNOSIS — N185 Chronic kidney disease, stage 5: Secondary | ICD-10-CM | POA: Diagnosis not present

## 2022-08-21 MED ORDER — EPOETIN ALFA-EPBX 40000 UNIT/ML IJ SOLN
INTRAMUSCULAR | Status: AC
  Filled 2022-08-21: qty 1

## 2022-08-21 MED ORDER — EPOETIN ALFA-EPBX 40000 UNIT/ML IJ SOLN
30000.0000 [IU] | INTRAMUSCULAR | Status: DC
  Administered 2022-08-21: 30000 [IU] via SUBCUTANEOUS

## 2022-08-22 LAB — POCT HEMOGLOBIN-HEMACUE: Hemoglobin: 9.7 g/dL — ABNORMAL LOW (ref 13.0–17.0)

## 2022-08-24 ENCOUNTER — Observation Stay (HOSPITAL_COMMUNITY)
Admission: EM | Admit: 2022-08-24 | Discharge: 2022-08-25 | Disposition: A | Discharge status: Home / Self Care | Payer: Medicare Other | Attending: Student | Admitting: Student

## 2022-08-24 ENCOUNTER — Other Ambulatory Visit: Payer: Self-pay

## 2022-08-24 ENCOUNTER — Emergency Department (HOSPITAL_COMMUNITY): Payer: Medicare Other

## 2022-08-24 ENCOUNTER — Inpatient Hospital Stay (HOSPITAL_COMMUNITY): Payer: Medicare Other

## 2022-08-24 ENCOUNTER — Encounter (HOSPITAL_COMMUNITY): Payer: Self-pay

## 2022-08-24 DIAGNOSIS — D631 Anemia in chronic kidney disease: Secondary | ICD-10-CM | POA: Diagnosis present

## 2022-08-24 DIAGNOSIS — I120 Hypertensive chronic kidney disease with stage 5 chronic kidney disease or end stage renal disease: Secondary | ICD-10-CM | POA: Insufficient documentation

## 2022-08-24 DIAGNOSIS — G319 Degenerative disease of nervous system, unspecified: Secondary | ICD-10-CM | POA: Diagnosis not present

## 2022-08-24 DIAGNOSIS — R29703 NIHSS score 3: Secondary | ICD-10-CM | POA: Insufficient documentation

## 2022-08-24 DIAGNOSIS — Z8673 Personal history of transient ischemic attack (TIA), and cerebral infarction without residual deficits: Secondary | ICD-10-CM | POA: Diagnosis not present

## 2022-08-24 DIAGNOSIS — I639 Cerebral infarction, unspecified: Secondary | ICD-10-CM | POA: Diagnosis not present

## 2022-08-24 DIAGNOSIS — E1142 Type 2 diabetes mellitus with diabetic polyneuropathy: Secondary | ICD-10-CM | POA: Diagnosis not present

## 2022-08-24 DIAGNOSIS — Z7982 Long term (current) use of aspirin: Secondary | ICD-10-CM | POA: Insufficient documentation

## 2022-08-24 DIAGNOSIS — J811 Chronic pulmonary edema: Secondary | ICD-10-CM | POA: Diagnosis not present

## 2022-08-24 DIAGNOSIS — R41841 Cognitive communication deficit: Secondary | ICD-10-CM | POA: Diagnosis not present

## 2022-08-24 DIAGNOSIS — E1122 Type 2 diabetes mellitus with diabetic chronic kidney disease: Secondary | ICD-10-CM | POA: Insufficient documentation

## 2022-08-24 DIAGNOSIS — Z79899 Other long term (current) drug therapy: Secondary | ICD-10-CM | POA: Insufficient documentation

## 2022-08-24 DIAGNOSIS — R4189 Other symptoms and signs involving cognitive functions and awareness: Secondary | ICD-10-CM | POA: Diagnosis present

## 2022-08-24 DIAGNOSIS — C649 Malignant neoplasm of unspecified kidney, except renal pelvis: Secondary | ICD-10-CM | POA: Diagnosis present

## 2022-08-24 DIAGNOSIS — I6381 Other cerebral infarction due to occlusion or stenosis of small artery: Secondary | ICD-10-CM | POA: Diagnosis not present

## 2022-08-24 DIAGNOSIS — Z85028 Personal history of other malignant neoplasm of stomach: Secondary | ICD-10-CM | POA: Insufficient documentation

## 2022-08-24 DIAGNOSIS — N400 Enlarged prostate without lower urinary tract symptoms: Secondary | ICD-10-CM | POA: Diagnosis present

## 2022-08-24 DIAGNOSIS — Z8546 Personal history of malignant neoplasm of prostate: Secondary | ICD-10-CM | POA: Insufficient documentation

## 2022-08-24 DIAGNOSIS — I129 Hypertensive chronic kidney disease with stage 1 through stage 4 chronic kidney disease, or unspecified chronic kidney disease: Secondary | ICD-10-CM | POA: Diagnosis not present

## 2022-08-24 DIAGNOSIS — Z85528 Personal history of other malignant neoplasm of kidney: Secondary | ICD-10-CM | POA: Insufficient documentation

## 2022-08-24 DIAGNOSIS — R4182 Altered mental status, unspecified: Secondary | ICD-10-CM | POA: Diagnosis present

## 2022-08-24 DIAGNOSIS — N185 Chronic kidney disease, stage 5: Secondary | ICD-10-CM | POA: Diagnosis not present

## 2022-08-24 DIAGNOSIS — K219 Gastro-esophageal reflux disease without esophagitis: Secondary | ICD-10-CM | POA: Diagnosis present

## 2022-08-24 LAB — URINALYSIS, ROUTINE W REFLEX MICROSCOPIC
Bacteria, UA: NONE SEEN
Bilirubin Urine: NEGATIVE
Glucose, UA: 150 mg/dL — AB
Hgb urine dipstick: NEGATIVE
Ketones, ur: NEGATIVE mg/dL
Leukocytes,Ua: NEGATIVE
Nitrite: NEGATIVE
Protein, ur: 100 mg/dL — AB
Specific Gravity, Urine: 1.01 (ref 1.005–1.030)
pH: 7 (ref 5.0–8.0)

## 2022-08-24 LAB — COMPREHENSIVE METABOLIC PANEL
ALT: 19 U/L (ref 0–44)
AST: 19 U/L (ref 15–41)
Albumin: 3.4 g/dL — ABNORMAL LOW (ref 3.5–5.0)
Alkaline Phosphatase: 94 U/L (ref 38–126)
Anion gap: 9 (ref 5–15)
BUN: 74 mg/dL — ABNORMAL HIGH (ref 8–23)
CO2: 19 mmol/L — ABNORMAL LOW (ref 22–32)
Calcium: 9 mg/dL (ref 8.9–10.3)
Chloride: 107 mmol/L (ref 98–111)
Creatinine, Ser: 9.48 mg/dL — ABNORMAL HIGH (ref 0.61–1.24)
GFR, Estimated: 5 mL/min — ABNORMAL LOW (ref >60)
Glucose, Bld: 99 mg/dL (ref 70–99)
Potassium: 4.9 mmol/L (ref 3.5–5.1)
Sodium: 135 mmol/L (ref 135–145)
Total Bilirubin: 0.4 mg/dL (ref 0.3–1.2)
Total Protein: 6.7 g/dL (ref 6.5–8.1)

## 2022-08-24 LAB — CBC WITH DIFFERENTIAL/PLATELET
Abs Immature Granulocytes: 0.01 10*3/uL (ref 0.00–0.07)
Basophils Absolute: 0 10*3/uL (ref 0.0–0.1)
Basophils Relative: 1 %
Eosinophils Absolute: 0.1 10*3/uL (ref 0.0–0.5)
Eosinophils Relative: 3 %
HCT: 32.2 % — ABNORMAL LOW (ref 39.0–52.0)
Hemoglobin: 10.4 g/dL — ABNORMAL LOW (ref 13.0–17.0)
Immature Granulocytes: 0 %
Lymphocytes Relative: 15 %
Lymphs Abs: 0.6 10*3/uL — ABNORMAL LOW (ref 0.7–4.0)
MCH: 30.8 pg (ref 26.0–34.0)
MCHC: 32.3 g/dL (ref 30.0–36.0)
MCV: 95.3 fL (ref 80.0–100.0)
Monocytes Absolute: 0.6 10*3/uL (ref 0.1–1.0)
Monocytes Relative: 17 %
Neutro Abs: 2.4 10*3/uL (ref 1.7–7.7)
Neutrophils Relative %: 64 %
Platelets: 225 10*3/uL (ref 150–400)
RBC: 3.38 MIL/uL — ABNORMAL LOW (ref 4.22–5.81)
RDW: 15.7 % — ABNORMAL HIGH (ref 11.5–15.5)
WBC: 3.7 10*3/uL — ABNORMAL LOW (ref 4.0–10.5)
nRBC: 0.5 % — ABNORMAL HIGH (ref 0.0–0.2)

## 2022-08-24 LAB — GLUCOSE, CAPILLARY: Glucose-Capillary: 85 mg/dL (ref 70–99)

## 2022-08-24 LAB — CBG MONITORING, ED: Glucose-Capillary: 84 mg/dL (ref 70–99)

## 2022-08-24 LAB — AMMONIA: Ammonia: 18 umol/L (ref 9–35)

## 2022-08-24 MED ORDER — TAMSULOSIN HCL 0.4 MG PO CAPS
0.4000 mg | ORAL_CAPSULE | Freq: Every day | ORAL | Status: DC
  Administered 2022-08-24 – 2022-08-25 (×2): 0.4 mg via ORAL
  Filled 2022-08-24 (×2): qty 1

## 2022-08-24 MED ORDER — FERROUS SULFATE 325 (65 FE) MG PO TABS
325.0000 mg | ORAL_TABLET | Freq: Every day | ORAL | Status: DC
  Administered 2022-08-25: 325 mg via ORAL
  Filled 2022-08-24: qty 1

## 2022-08-24 MED ORDER — STROKE: EARLY STAGES OF RECOVERY BOOK
Freq: Once | Status: AC
  Filled 2022-08-24: qty 1

## 2022-08-24 MED ORDER — CYCLOBENZAPRINE HCL 10 MG PO TABS: 5.0000 mg | ORAL_TABLET | Freq: Two times a day (BID) | ORAL | Status: DC | PRN

## 2022-08-24 MED ORDER — ASPIRIN 325 MG PO TABS: 325.0000 mg | ORAL_TABLET | Freq: Every day | ORAL | Status: DC

## 2022-08-24 MED ORDER — ACETAMINOPHEN 325 MG PO TABS: 650.0000 mg | ORAL_TABLET | Freq: Four times a day (QID) | ORAL | Status: DC | PRN

## 2022-08-24 MED ORDER — ACETAMINOPHEN 325 MG PO TABS: 650.0000 mg | ORAL_TABLET | ORAL | Status: DC | PRN

## 2022-08-24 MED ORDER — MIRTAZAPINE 15 MG PO TBDP
15.0000 mg | ORAL_TABLET | Freq: Every day | ORAL | Status: DC
  Administered 2022-08-24: 15 mg via ORAL
  Filled 2022-08-24 (×2): qty 1

## 2022-08-24 MED ORDER — HEPARIN SODIUM (PORCINE) 5000 UNIT/ML IJ SOLN
5000.0000 [IU] | Freq: Three times a day (TID) | INTRAMUSCULAR | Status: DC
  Administered 2022-08-24 – 2022-08-25 (×2): 5000 [IU] via SUBCUTANEOUS
  Filled 2022-08-24 (×2): qty 1

## 2022-08-24 MED ORDER — SODIUM CHLORIDE 0.9 % IV SOLN: Freq: Once | INTRAVENOUS | Status: AC

## 2022-08-24 MED ORDER — CLOPIDOGREL BISULFATE 75 MG PO TABS
75.0000 mg | ORAL_TABLET | Freq: Every day | ORAL | Status: DC
  Administered 2022-08-24 – 2022-08-25 (×2): 75 mg via ORAL
  Filled 2022-08-24 (×3): qty 1

## 2022-08-24 MED ORDER — SODIUM BICARBONATE 650 MG PO TABS
1300.0000 mg | ORAL_TABLET | Freq: Three times a day (TID) | ORAL | Status: DC
  Administered 2022-08-24 – 2022-08-25 (×4): 1300 mg via ORAL
  Filled 2022-08-24 (×4): qty 2

## 2022-08-24 MED ORDER — ONDANSETRON HCL 4 MG/2ML IJ SOLN: 4.0000 mg | Freq: Four times a day (QID) | INTRAMUSCULAR | Status: DC | PRN

## 2022-08-24 MED ORDER — ROSUVASTATIN CALCIUM 20 MG PO TABS
20.0000 mg | ORAL_TABLET | Freq: Every day | ORAL | Status: DC
  Administered 2022-08-24 – 2022-08-25 (×2): 20 mg via ORAL
  Filled 2022-08-24 (×2): qty 1

## 2022-08-24 MED ORDER — METOPROLOL TARTRATE 25 MG PO TABS
25.0000 mg | ORAL_TABLET | Freq: Two times a day (BID) | ORAL | Status: DC
  Administered 2022-08-24 – 2022-08-25 (×2): 25 mg via ORAL
  Filled 2022-08-24 (×2): qty 1

## 2022-08-24 MED ORDER — ACETAMINOPHEN 650 MG RE SUPP: 650.0000 mg | RECTAL | Status: DC | PRN

## 2022-08-24 MED ORDER — SENNOSIDES-DOCUSATE SODIUM 8.6-50 MG PO TABS: 1.0000 | ORAL_TABLET | Freq: Every evening | ORAL | Status: DC | PRN

## 2022-08-24 MED ORDER — DULOXETINE HCL 20 MG PO CPEP
20.0000 mg | ORAL_CAPSULE | Freq: Every day | ORAL | Status: DC
  Administered 2022-08-24 – 2022-08-25 (×2): 20 mg via ORAL
  Filled 2022-08-24 (×2): qty 1

## 2022-08-24 MED ORDER — ACETAMINOPHEN 160 MG/5ML PO SOLN: 650.0000 mg | ORAL | Status: DC | PRN

## 2022-08-24 MED ORDER — ASPIRIN 81 MG PO TBEC
81.0000 mg | DELAYED_RELEASE_TABLET | Freq: Every day | ORAL | Status: DC
  Administered 2022-08-24 – 2022-08-25 (×2): 81 mg via ORAL
  Filled 2022-08-24 (×2): qty 1

## 2022-08-24 MED ORDER — GEMFIBROZIL 600 MG PO TABS
600.0000 mg | ORAL_TABLET | Freq: Two times a day (BID) | ORAL | Status: DC
  Administered 2022-08-24 – 2022-08-25 (×2): 600 mg via ORAL
  Filled 2022-08-24 (×4): qty 1

## 2022-08-24 NOTE — ED Triage Notes (Signed)
Pt arrives with fiance. After she left last night at 2200, he called her saying things are different. This morning, he said the house was different- "the bedroom had changed"- things did not seem to be laid out the same way as before.  Fiance states that this has happened at a similar time after he got an injection- which he had on Monday.  Denies headache, urinary issues.

## 2022-08-24 NOTE — Progress Notes (Signed)
Carotid duplex bilateral attempted. Patient transfer in progress.   Jean Rosenthal, RDMS, RVT

## 2022-08-24 NOTE — H&P (Signed)
History and Physical  Antonio Adams:[id number] DOB: 1945/11/19 DOA: 08/24/2022  PCP: Etta Grandchild, Adams   Chief Complaint: "feeling off"   HPI: Antonio Adams is a 77 y.o. male with medical history significant for metastatic renal cell carcinoma, stage V CKD declining dialysis, GERD, left PCA infarction November 2023 being admitted to the hospital with acute left parietal occipital infarcts and area of prior stroke.  History is provided mainly by the patient's fianc, as the patient is somewhat sleepy.  She states that he was in his usual state of health until yesterday evening, when she spoke to him on the phone and he was saying that objects in his room seem to be moving around when he did not move them.  He did not otherwise sound disoriented or confused.  She said that he actually acted the same way late last year when he had his prior CVA.  Otherwise, he has been stable and in his usual state of health, with no medication changes, etc.  She did note that starting about a week ago, his blood pressure was a little bit more elevated than usual despite continued to take his same medications.  She says at baseline, his systolic blood pressure is usually in the 130s, but she noticed for the last week or so that his blood pressure was a little higher, in the 160s.  She specifically denies any complaints of nausea, vomiting, fevers, chills, confusion.  Note he was previously admitted to [facility] for left PCA territory acute stroke in November 2023 after syncope/fall.  After that admission, he was on dual antiplatelet therapy with aspirin and Plavix for 3 months, he is currently on aspirin alone.  ED Course: Patient was evaluated upon arrival in the emergency department, he was already outside the window for any intervention, and did not have any evidence of large vessel occlusion.  As noted below, MRI shows evidence of acute stroke.  ER provider discussed with neurology who recommends  admission to [facility] for formal evaluation, and hospitalist was requested to admit.  Currently patient is resting comfortably with his fiance at the bedside.  He has no complaints.  Review of Systems: Please see HPI for pertinent positives and negatives. A complete 10 system review of systems are otherwise negative.  Past Medical History:  Diagnosis Date   ADENOCARCINOMA, PROSTATE, GLEASON GRADE 6 12/21/2009   prostate cancer   ALLERGIC RHINITIS 03/16/2007   Allergy    Anxiety    Arthritis    back    BACK PAIN WITH RADICULOPATHY 11/23/2008   Cancer of kidney    partial right kidney removed   CARPAL TUNNEL SYNDROME, BILATERAL 03/16/2007   Chronic back pain    Constipation    takes Senokot daily   Depression    occasionally   DIABETES MELLITUS, TYPE II 03/16/2007   takes Januvia and MEtformin daily   GASTROINTESTINAL HEMORRHAGE, HX OF 03/16/2007   GERD 03/16/2007   takes Omeprazole daily   Glaucoma    mild - no eye drops   Hemorrhoids    History of colon polyps    HYPERLIPIDEMIA 03/16/2007   takes Crestor daily   HYPERTENSION 03/16/2007   takes Diltiazem and Lisinopril daily    Kidney cancer, primary, with metastasis from kidney to other site    LEG CRAMPS 05/22/2007   Nocturia    Overactive bladder    PARESTHESIA 05/21/2007   Proctitis    PTSD (post-traumatic stress disorder)    wakes  up may be dreaming of fighting   Rectal bleeding    Dr.Norins has explained its from the Radiation that he has received   Rectal bleeding    Renal cell carcinoma    Sleep apnea    uses CPAP nightly   Stomach cancer    Urinary frequency    takes Toviaz daily   Past Surgical History:  Procedure Laterality Date   ADRENALECTOMY Right 02/26/2020   Procedure: OPEN RIGHT RENAL CELL METASTATECTOMY AND  ADRENALECTOMY;  Surgeon: Antonio Adams;  Location: [facility];  Service: Urology;  Laterality: Right;   BACK SURGERY  1983, 2008   repeat surgery; ESI '08   CARPAL TUNNEL RELEASE      bilateral   CERVICAL FUSION     COLONOSCOPY     colonosocpy     ESOPHAGOGASTRODUODENOSCOPY     ED   FLEXIBLE SIGMOIDOSCOPY N/A 01/08/2017   Procedure: FLEXIBLE SIGMOIDOSCOPY;  Surgeon: Hilarie Fredrickson, Adams;  Location[facility];  Service: Endoscopy;  Laterality: N/A;   HOT HEMOSTASIS N/A 01/08/2017   Procedure: HOT HEMOSTASIS (ARGON PLASMA COAGULATION/BICAP);  Surgeon: Hilarie Fredrickson, Adams;  Location: [facility] ENDOSCOPY;  Service: Endoscopy;  Laterality: N/A;   IR RADIOLOGIST EVAL & MGMT  12/17/2019   IR RADIOLOGIST EVAL & MGMT  07/01/2020   IR RADIOLOGIST EVAL & MGMT  08/25/2020   LUMBAR LAMINECTOMY  10/30/2011   Procedure: MICRODISCECTOMY LUMBAR LAMINECTOMY;  Surgeon: Kerrin Champagne, Adams;  Location: [facility] OR;  Service: Orthopedics;  Laterality: Right;  Right L4-5 and L5-S1 Microdiscectomy   LUMBAR LAMINECTOMY  06/17/2012   Procedure: MICRODISCECTOMY LUMBAR LAMINECTOMY;  Surgeon: Kerrin Champagne, Adams;  Location: [facility] OR;  Service: Orthopedics;  Laterality: N/A;  Right L5-S1 microdiscectomy   OPEN PARTIAL HEPATECTOMY  N/A 02/26/2020   Procedure: OPEN PARTIAL HEPATECTOMY, CHOLECYSTECTOMY;  Surgeon: Almond Lint, Adams;  Location: [facility];  Service: General;  Laterality: N/A;   POLYPECTOMY     PROSTATE SURGERY  march 2012   seed implant   RADIOFREQUENCY ABLATION N/A 07/21/2020   Procedure: CT MICROWAVE ABLATION;  Surgeon: Sterling Big, Adams;  Location: [facility];  Service: Anesthesiology;  Laterality: N/A;   ROBOTIC ASSITED PARTIAL NEPHRECTOMY Right 09/08/2015   Procedure: XI ROBOTIC ASSITED RIGHT PARTIAL NEPHRECTOMY;  Surgeon: Antonio Adams;  Location: [facility];  Service: Urology;  Laterality: Right;  Clamp on: 1033 Clamp off: 1103 Total Clamp Time: 30 minutes   SHOULDER ARTHROSCOPY WITH SUBACROMIAL DECOMPRESSION Left 01/20/2016   Procedure: LEFT SHOULDER ARTHROSCOPY WITH EXTENSIVE  DEBRIDEMENT, ACROMIOPLASTY;  Surgeon: Loreta Ave, Adams;  Location: [facility];  Service: Orthopedics;  Laterality:  Left;   SHOULDER SURGERY  LFT   Stress Cardiolite  12/24/2003   negative for ischemia   UPPER ESOPHAGEAL ENDOSCOPIC ULTRASOUND (EUS)  04/18/2016   [facility]   UPPER GASTROINTESTINAL ENDOSCOPY     WOUND EXPLORATION     management of a wound that he sustained in the [employer]    Social History:  reports that he has never smoked. He has never used smokeless tobacco. He reports that he does not currently use alcohol. He reports that he does not use drugs.   Allergies  Allergen Reactions   Ace Inhibitors Swelling    lisinopril   Gabapentin Other (See Comments)    hallucinations   Xyzal [Levocetirizine] Other (See Comments)    Break out   Opdivo [Nivolumab] Rash    Family History  Problem Relation Age of Onset  Cancer Sister 30       Cancer, unsure type   Hypertension Other    Colon cancer Neg Hx    Esophageal cancer Neg Hx    Stomach cancer Neg Hx    Diabetes Neg Hx    Colon polyps Neg Hx    Rectal cancer Neg Hx      Prior to Admission medications   Medication Sig Start Date End Date Taking? Authorizing Provider  acetaminophen (TYLENOL) 500 MG tablet Take 1,000 mg by mouth every 6 (six) hours as needed for mild pain.    Provider, Historical, Adams  Apoaequorin (PREVAGEN EXTRA STRENGTH PO) Take 1 tablet by mouth daily.    Provider, Historical, Adams  aspirin EC 81 MG tablet Take 1 tablet (81 mg total) by mouth daily. Swallow whole. 04/09/22   Ghimire, Werner LeanShanker M, Adams  calcitRIOL (ROCALTROL) 0.25 MCG capsule Take 0.25 mcg by mouth daily. 01/05/22   Provider, Historical, Adams  Cyanocobalamin (B-12 PO) Take 1,000 mcg by mouth daily.    Provider, Historical, Adams  cyclobenzaprine (FLEXERIL) 5 MG tablet Take 1 tablet (5 mg total) by mouth 2 (two) times daily as needed for muscle spasms. 03/19/22   Blane OharaZavitz, Joshua, Adams  DULoxetine (CYMBALTA) 20 MG capsule Take 1 capsule (20 mg total) by mouth daily. 05/29/22   Pincus SanesBurns, Stacy J, Adams  ferrous sulfate 325 (65 FE) MG tablet Take 1 tablet (325 mg total)  by mouth 2 (two) times daily. Patient needs office visit for further refills Patient taking differently: Take 325 mg by mouth daily with breakfast. 02/24/19   Hilarie FredricksonPerry, John N, Adams  fluocinonide-emollient (LIDEX-E) 0.05 % cream Apply 1 Application topically 2 (two) times daily as needed (dry skin).    Provider, Historical, Adams  furosemide (LASIX) 40 MG tablet TAKE 1 TABLET BY MOUTH EVERY DAY 06/22/22   Etta GrandchildJones, Thomas L, Adams  gemfibrozil (LOPID) 600 MG tablet Take 600 mg by mouth 2 (two) times daily before a meal. 02/01/22   Provider, Historical, Adams  LOKELMA 10 g PACK packet Take 10 g by mouth daily. 05/04/21   Provider, Historical, Adams  metoprolol tartrate (LOPRESSOR) 25 MG tablet TAKE 1 TABLET BY MOUTH TWICE A DAY 06/22/22   Jodelle Redhristopher, Bridgette, Adams  mirtazapine (REMERON SOL-TAB) 15 MG disintegrating tablet Take 15 mg by mouth at bedtime.    Provider, Historical, Adams  Omega-3 Fatty Acids (FISH OIL PO) Take 1,000 mg by mouth 2 (two) times a week.    Provider, Historical, Adams  OVER THE COUNTER MEDICATION Take 1 tablet by mouth daily. Focus factor    Provider, Historical, Adams  pantoprazole (PROTONIX) 20 MG tablet Take 1 tablet (20 mg total) by mouth daily. Patient taking differently: Take 20 mg by mouth daily as needed for heartburn or indigestion. 01/08/22   Azucena FallenLancaster, William C, Adams  polyvinyl alcohol (LIQUIFILM TEARS) 1.4 % ophthalmic solution Place 1 drop into both eyes daily as needed for dry eyes.    Provider, Historical, Adams  rosuvastatin (CRESTOR) 20 MG tablet Take 1 tablet (20 mg total) by mouth daily. 04/09/22   Ghimire, Werner LeanShanker M, Adams  sildenafil (VIAGRA) 100 MG tablet Take 100 mg by mouth daily as needed. 05/02/22   Provider, Historical, Adams  sodium bicarbonate 650 MG tablet Take 2 tablets (1,300 mg total) by mouth 2 (two) times daily. 04/09/22   Ghimire, Werner LeanShanker M, Adams  solifenacin (VESICARE) 5 MG tablet Take 1 tablet (5 mg total) by mouth daily. 06/13/22   Etta GrandchildJones, Thomas L, Adams  tamsulosin (FLOMAX) 0.4 MG CAPS  capsule TAKE 1 CAPSULE (0.4 MG TOTAL) BY MOUTH DAILY. Patient taking differently: Take 0.4 mg by mouth daily. 01/20/14   Etta Grandchild, Adams  zinc gluconate 50 MG tablet Take 1 tablet (50 mg total) by mouth daily. 12/20/21   Etta Grandchild, Adams    Physical Exam: BP (!) 178/99   Pulse 73   Temp 97.9 F (36.6 C) (Oral)   Resp 16   Ht 6\' 3"  (1.905 m)   Wt 93 kg   SpO2 98%   BMI 25.62 kg/m   General:  Alert, comfortable, somewhat sleepy but arousable and conversant. Eyes: EOMI, clear conjuctivae, white sclerea Neck: supple, no masses, trachea mildline  Cardiovascular: RRR, no murmurs or rubs, no peripheral edema  Respiratory: clear to auscultation bilaterally, no wheezes, no crackles  Abdomen: soft, nontender, nondistended, normal bowel tones heard  Skin: dry, no rashes  Musculoskeletal: no joint effusions, normal range of motion  Psychiatric: appropriate affect, normal speech  Neurologic: extraocular muscles intact, clear speech a little slow, moving all extremities with intact sensorium and normal strength. CN II-XII intact. Oriented to self and place only.          Labs on Admission:  Basic Metabolic Panel: Recent Labs  Lab 08/24/22 0852  NA 135  K 4.9  CL 107  CO2 19*  GLUCOSE 99  BUN 74*  CREATININE 9.48*  CALCIUM 9.0   Liver Function Tests: Recent Labs  Lab 08/24/22 0852  AST 19  ALT 19  ALKPHOS 94  BILITOT 0.4  PROT 6.7  ALBUMIN 3.4*   No results for input(s): "LIPASE", "AMYLASE" in the last 168 hours. Recent Labs  Lab 08/24/22 0852  AMMONIA 18   CBC: Recent Labs  Lab 08/21/22 0915 08/24/22 0852  WBC  --  3.7*  NEUTROABS  --  2.4  HGB 9.7* 10.4*  HCT  --  32.2*  MCV  --  95.3  PLT  --  225   Cardiac Enzymes: No results for input(s): "CKTOTAL", "CKMB", "CKMBINDEX", "TROPONINI" in the last 168 hours.  BNP (last 3 results) Recent Labs    01/07/22 1131  BNP 207.6*    ProBNP (last 3 results) No results for input(s): "PROBNP" in the last  8760 hours.  CBG: Recent Labs  Lab 08/24/22 0858  GLUCAP 84    Radiological Exams on Admission: MR BRAIN WO CONTRAST  Result Date: 08/24/2022 CLINICAL DATA:  Neuro deficit, acute, stroke suspected EXAM: MRI HEAD WITHOUT CONTRAST TECHNIQUE: Multiplanar, multiecho pulse sequences of the brain and surrounding structures were obtained without intravenous contrast. COMPARISON:  Same day CT head. FINDINGS: Brain: Acute left parieto-occipital periventricular infarcts in the region of prior infarct. No evidence of acute hemorrhage, mass lesion, midline shift or hydrocephalus. Cerebral atrophy. Scattered T2/FLAIR hyperintensities the white matter, compatible with chronic microvascular ischemic change. Vascular: Major arterial flow voids are maintained at the skull base. Skull and upper cervical spine: Normal marrow signal. Sinuses/Orbits: Clear sinuses.  No acute findings. Other: No mastoid effusions. IMPRESSION: Acute left parieto-occipital periventricular infarcts in the region of prior infarct. Electronically Signed   By: Feliberto Harts M.D.   On: 08/24/2022 11:35   CT Head Wo Contrast  Result Date: 08/24/2022 CLINICAL DATA:  Mental status change, unknown cause. EXAM: CT HEAD WITHOUT CONTRAST TECHNIQUE: Contiguous axial images were obtained from the base of the skull through the vertex without intravenous contrast. RADIATION DOSE REDUCTION: This exam was performed according to the departmental dose-optimization  program which includes automated exposure control, adjustment of the mA and/or kV according to patient size and/or use of iterative reconstruction technique. COMPARISON:  Head CT 04/07/2022 and MRI 04/08/2022 FINDINGS: Brain: A moderate-sized chronic left PCA infarct is present involving the left occipital lobe and thalamus (acute to subacute on the prior studies). A lacunar infarct in the right thalamus is new and of indeterminate acuity. No acute cortically based infarct intracranial  hemorrhage, mass, midline shift, or extra-axial fluid collection is identified. There is a background of mild chronic small vessel ischemia in the cerebral white matter. There is mild-to-moderate cerebral atrophy. Vascular: Calcified atherosclerosis at the skull base. No hyperdense vessel. Skull: No fracture or suspicious osseous lesion. Sinuses/Orbits: Visualized paranasal sinuses and mastoid air cells are clear. Unremarkable orbits. Other: None. IMPRESSION: 1. Age-indeterminate right thalamic lacunar infarct. 2. Chronic left PCA infarct. Electronically Signed   By: Sebastian Ache M.D.   On: 08/24/2022 09:07   DG Chest 2 View  Result Date: 08/24/2022 CLINICAL DATA:  Altered level of consciousness. EXAM: CHEST - 2 VIEW COMPARISON:  One-view chest x-ray 04/07/2022 FINDINGS: The heart is mildly enlarged. Mild pulmonary vascular congestion is present. No frank edema or focal airspace disease is present. The visualized soft tissues and bony thorax are unremarkable. IMPRESSION: Mild cardiomegaly and pulmonary vascular congestion without frank edema. Electronically Signed   By: Marin Roberts M.D.   On: 08/24/2022 08:33    Assessment/Plan Principal Problem:   Acute ischemic stroke -MRI shows evidence of acute left parietal occipital infarction -Inpatient admission to telemetry at [facility] -Keep n.p.o. until nurse bedside swallow screen -Resume home aspirin 81 mg, add Plavix 75 mg p.o. first dose now -Anticipate formal inpatient neurology consultation -Check 2D echo, carotid Dopplers -Check fasting lipids and hemoglobin A1c, resume his Crestor and Lopid in the meantime -PT/OT evaluation  Active Problems:   Metastatic renal cell carcinoma   GERD   Diabetic polyneuropathy associated with type 2 diabetes mellitus   CKD (chronic kidney disease) stage 5, GFR less than 15 ml/min -Patient has declined dialysis, followed by nephrology as an outpatient -Currently no significant electrolyte derangements,  will continue sodium bicarb and Lokelma -Currently no indication for nephrology consultation inpatient   BPH (benign prostatic hyperplasia)   Anemia due to chronic kidney disease-continue iron supplementation   Hx of completed stroke  DVT prophylaxis: Heparin subcu due to CKD    Code Status: DNR, discussed and confirmed with patient at the time of admission.  In the presence of his fiance.  Consults called: None  Admission status: The appropriate patient status for this patient is INPATIENT. Inpatient status is judged to be reasonable and necessary in order to provide the required intensity of service to ensure the patient's safety. The patient's presenting symptoms, physical exam findings, and initial radiographic and laboratory data in the context of their chronic comorbidities is felt to place them at high risk for further clinical deterioration. Furthermore, it is not anticipated that the patient will be medically stable for discharge from the hospital within 2 midnights of admission.    I certify that at the point of admission it is my clinical judgment that the patient will require inpatient hospital care spanning beyond 2 midnights from the point of admission due to high intensity of service, high risk for further deterioration and high frequency of surveillance required   Time spent: 48 minutes  Oluwatimilehin Balfour Sharlette Dense Adams [facility] Pager [phone]  If 7PM-7AM, please contact night-coverage [email or web address] Password [email or web address]  08/24/2022, 12:43 PM

## 2022-08-24 NOTE — ED Notes (Signed)
ED TO INPATIENT HANDOFF REPORT  Name/Age/Gender Antonio Adams 77 y.o. male  Code Status    Code Status Orders  (From admission, onward)           Start     Ordered   08/24/22 1223  Do not attempt resuscitation (DNR)  Continuous       Question Answer Comment  If patient has no pulse and is not breathing Do Not Attempt Resuscitation   If patient has a pulse and/or is breathing: Medical Treatment Goals MEDICAL INTERVENTIONS DESIRED: Use advanced airway interventions, mechanical ventilation or cardioversion in appropriate circumstances; Use medication/IV fluids as indicated; Provide comfort medications; Transfer to Progressive/Stepdown/ICU as indicated.   Consent: Discussion documented in EHR or advanced directives reviewed      08/24/22 1225           Code Status History     Date Active Date Inactive Code Status Order ID Comments User Context   04/08/2022 0140 04/09/2022 1806 DNR [id number]  Alberteen Sam, MD Inpatient   01/07/2022 1448 01/08/2022 1741 Full Code [id number]  Emeline General, MD ED   07/20/2021 2250 07/22/2021 1745 Full Code [id number]  Chotiner, Claudean Severance, MD ED   05/23/2021 1801 05/29/2021 1841 Full Code [id number]  Teddy Spike, DO Inpatient   07/21/2020 1907 07/22/2020 1955 Full Code [id number]  Sterling Big, MD Inpatient   06/17/2012 1305 06/18/2012 1530 Full Code [id number]  Lavina Hamman., RN Inpatient   10/30/2011 1306 10/31/2011 1604 Full Code [id number]  Briant Sites, RN Inpatient       Home/SNF/Other Home  Chief Complaint Acute ischemic stroke [I63.9]  Level of Care/Admitting Diagnosis ED Disposition     ED Disposition  Admit   Condition  --   Comment  Hospital Area: [facility] [[id number]]  Level of Care: Telemetry Medical [104]  May admit patient to [facility] or [facility] if equivalent level of care is available:: No  Covid Evaluation: Asymptomatic - no recent exposure (last 10 days) testing not  required  Diagnosis: Acute ischemic stroke [360503]  Admitting Physician: Maryln Gottron [[id number]]  Attending Physician: Kirby Crigler, MIR [place] [[id number]]  Certification:: I certify this patient will need inpatient services for at least 2 midnights  Estimated Length of Stay: 3          Medical History Past Medical History:  Diagnosis Date   ADENOCARCINOMA, PROSTATE, GLEASON GRADE 6 12/21/2009   prostate cancer   ALLERGIC RHINITIS 03/16/2007   Allergy    Anxiety    Arthritis    back    BACK PAIN WITH RADICULOPATHY 11/23/2008   Cancer of kidney    partial right kidney removed   CARPAL TUNNEL SYNDROME, BILATERAL 03/16/2007   Chronic back pain    Constipation    takes Senokot daily   Depression    occasionally   DIABETES MELLITUS, TYPE II 03/16/2007   takes Januvia and MEtformin daily   GASTROINTESTINAL HEMORRHAGE, HX OF 03/16/2007   GERD 03/16/2007   takes Omeprazole daily   Glaucoma    mild - no eye drops   Hemorrhoids    History of colon polyps    HYPERLIPIDEMIA 03/16/2007   takes Crestor daily   HYPERTENSION 03/16/2007   takes Diltiazem and Lisinopril daily    Kidney cancer, primary, with metastasis from kidney to other site    LEG CRAMPS 05/22/2007   Nocturia    Overactive bladder    PARESTHESIA 05/21/2007   Proctitis  PTSD (post-traumatic stress disorder)    wakes up may be dreaming of fighting   Rectal bleeding    Dr.Norins has explained its from the Radiation that he has received   Rectal bleeding    Renal cell carcinoma    Sleep apnea    uses CPAP nightly   Stomach cancer    Urinary frequency    takes Toviaz daily    Allergies Allergies  Allergen Reactions   Ace Inhibitors Swelling    lisinopril   Gabapentin Other (See Comments)    hallucinations   Xyzal [Levocetirizine] Other (See Comments)    Break out   Opdivo [Nivolumab] Rash    IV Location/Drains/Wounds Patient Lines/Drains/Airways Status     Active Line/Drains/Airways     Name Placement date  Placement time Site Days   Peripheral IV 08/24/22 20 G Anterior;Left Forearm 08/24/22  0851  Forearm  less than 1            Labs/Imaging Results for orders placed or performed during the hospital encounter of 08/24/22 (from the past 48 hour(s))  CBC with Differential     Status: Abnormal   Collection Time: 08/24/22  8:52 AM  Result Value Ref Range   WBC 3.7 (L) 4.0 - 10.5 K/uL   RBC 3.38 (L) 4.22 - 5.81 MIL/uL   Hemoglobin 10.4 (L) 13.0 - 17.0 g/dL   HCT 45.432.2 (L) 09.839.0 - 11.952.0 %   MCV 95.3 80.0 - 100.0 fL   MCH 30.8 26.0 - 34.0 pg   MCHC 32.3 30.0 - 36.0 g/dL   RDW 14.715.7 (H) 82.911.5 - 56.215.5 %   Platelets 225 150 - 400 K/uL   nRBC 0.5 (H) 0.0 - 0.2 %   Neutrophils Relative % 64 %   Neutro Abs 2.4 1.7 - 7.7 K/uL   Lymphocytes Relative 15 %   Lymphs Abs 0.6 (L) 0.7 - 4.0 K/uL   Monocytes Relative 17 %   Monocytes Absolute 0.6 0.1 - 1.0 K/uL   Eosinophils Relative 3 %   Eosinophils Absolute 0.1 0.0 - 0.5 K/uL   Basophils Relative 1 %   Basophils Absolute 0.0 0.0 - 0.1 K/uL   Immature Granulocytes 0 %   Abs Immature Granulocytes 0.01 0.00 - 0.07 K/uL    Comment: Performed at [facility], [address] [place], [place] [address]  Comprehensive metabolic panel     Status: Abnormal   Collection Time: 08/24/22  8:52 AM  Result Value Ref Range   Sodium 135 135 - 145 mmol/L   Potassium 4.9 3.5 - 5.1 mmol/L   Chloride 107 98 - 111 mmol/L   CO2 19 (L) 22 - 32 mmol/L   Glucose, Bld 99 70 - 99 mg/dL    Comment: Glucose reference range applies only to samples taken after fasting for at least 8 hours.   BUN 74 (H) 8 - 23 mg/dL   Creatinine, Ser 5.789.48 (H) 0.61 - 1.24 mg/dL   Calcium 9.0 8.9 - 46.910.3 mg/dL   Total Protein 6.7 6.5 - 8.1 g/dL   Albumin 3.4 (L) 3.5 - 5.0 g/dL   AST 19 15 - 41 U/L   ALT 19 0 - 44 U/L   Alkaline Phosphatase 94 38 - 126 U/L   Total Bilirubin 0.4 0.3 - 1.2 mg/dL   GFR, Estimated 5 (L) >60 mL/min    Comment: (NOTE) Calculated using the  CKD-EPI Creatinine Equation (2021)    Anion gap 9 5 - 15  Comment: Performed at [facility], [address] [place], [place] [address]  Ammonia     Status: None   Collection Time: 08/24/22  8:52 AM  Result Value Ref Range   Ammonia 18 9 - 35 umol/L    Comment: Performed at [facility], [address], [place], [place] [address]  CBG monitoring, ED     Status: None   Collection Time: 08/24/22  8:58 AM  Result Value Ref Range   Glucose-Capillary 84 70 - 99 mg/dL    Comment: Glucose reference range applies only to samples taken after fasting for at least 8 hours.  Urinalysis, Routine w reflex microscopic -Urine, Clean Catch     Status: Abnormal   Collection Time: 08/24/22  9:22 AM  Result Value Ref Range   Color, Urine STRAW (A) YELLOW   APPearance CLEAR CLEAR   Specific Gravity, Urine 1.010 1.005 - 1.030   pH 7.0 5.0 - 8.0   Glucose, UA 150 (A) NEGATIVE mg/dL   Hgb urine dipstick NEGATIVE NEGATIVE   Bilirubin Urine NEGATIVE NEGATIVE   Ketones, ur NEGATIVE NEGATIVE mg/dL   Protein, ur 540 (A) NEGATIVE mg/dL   Nitrite NEGATIVE NEGATIVE   Leukocytes,Ua NEGATIVE NEGATIVE   RBC / HPF 0-5 0 - 5 RBC/hpf   WBC, UA 0-5 0 - 5 WBC/hpf   Bacteria, UA NONE SEEN NONE SEEN   Squamous Epithelial / HPF 0-5 0 - 5 /HPF   Mucus PRESENT     Comment: Performed at [facility], [address] [place], [place] [address]   MR BRAIN WO CONTRAST  Result Date: 08/24/2022 CLINICAL DATA:  Neuro deficit, acute, stroke suspected EXAM: MRI HEAD WITHOUT CONTRAST TECHNIQUE: Multiplanar, multiecho pulse sequences of the brain and surrounding structures were obtained without intravenous contrast. COMPARISON:  Same day CT head. FINDINGS: Brain: Acute left parieto-occipital periventricular infarcts in the region of prior infarct. No evidence of acute hemorrhage, mass lesion, midline shift or hydrocephalus. Cerebral atrophy. Scattered T2/FLAIR  hyperintensities the white matter, compatible with chronic microvascular ischemic change. Vascular: Major arterial flow voids are maintained at the skull base. Skull and upper cervical spine: Normal marrow signal. Sinuses/Orbits: Clear sinuses.  No acute findings. Other: No mastoid effusions. IMPRESSION: Acute left parieto-occipital periventricular infarcts in the region of prior infarct. Electronically Signed   By: Feliberto Harts M.D.   On: 08/24/2022 11:35   CT Head Wo Contrast  Result Date: 08/24/2022 CLINICAL DATA:  Mental status change, unknown cause. EXAM: CT HEAD WITHOUT CONTRAST TECHNIQUE: Contiguous axial images were obtained from the base of the skull through the vertex without intravenous contrast. RADIATION DOSE REDUCTION: This exam was performed according to the departmental dose-optimization program which includes automated exposure control, adjustment of the mA and/or kV according to patient size and/or use of iterative reconstruction technique. COMPARISON:  Head CT 04/07/2022 and MRI 04/08/2022 FINDINGS: Brain: A moderate-sized chronic left PCA infarct is present involving the left occipital lobe and thalamus (acute to subacute on the prior studies). A lacunar infarct in the right thalamus is new and of indeterminate acuity. No acute cortically based infarct intracranial hemorrhage, mass, midline shift, or extra-axial fluid collection is identified. There is a background of mild chronic small vessel ischemia in the cerebral white matter. There is mild-to-moderate cerebral atrophy. Vascular: Calcified atherosclerosis at the skull base. No hyperdense vessel. Skull: No fracture or suspicious osseous lesion. Sinuses/Orbits: Visualized paranasal sinuses and mastoid air cells are clear. Unremarkable orbits. Other: None. IMPRESSION:  1. Age-indeterminate right thalamic lacunar infarct. 2. Chronic left PCA infarct. Electronically Signed   By: Sebastian Ache M.D.   On: 08/24/2022 09:07   DG Chest 2  View  Result Date: 08/24/2022 CLINICAL DATA:  Altered level of consciousness. EXAM: CHEST - 2 VIEW COMPARISON:  One-view chest x-ray 04/07/2022 FINDINGS: The heart is mildly enlarged. Mild pulmonary vascular congestion is present. No frank edema or focal airspace disease is present. The visualized soft tissues and bony thorax are unremarkable. IMPRESSION: Mild cardiomegaly and pulmonary vascular congestion without frank edema. Electronically Signed   By: Marin [place] M.D.   On: 08/24/2022 08:33    Pending Labs Unresulted Labs (From admission, onward)     Start     Ordered   08/25/22 0500  Lipid panel  (Labs)  Tomorrow morning,   R       Comments: Fasting    08/24/22 1225   08/25/22 0500  Hemoglobin A1c  (Labs)  Tomorrow morning,   R       Comments: To assess prior glycemic control    08/24/22 1225            Vitals/Pain Today's Vitals   08/24/22 1200 08/24/22 1230 08/24/22 1300 08/24/22 1323  BP: (!) 178/99 (!) 175/103 (!) 183/100 (!) 175/99  Pulse: 73 73 75 73  Resp:    16  Temp:    97.7 F (36.5 C)  TempSrc:    Oral  SpO2: 98% 98% 97% 98%  Weight:      Height:      PainSc:        Isolation Precautions No active isolations  Medications Medications  clopidogrel (PLAVIX) tablet 75 mg (has no administration in time range)  aspirin EC tablet 81 mg (has no administration in time range)  gemfibrozil (LOPID) tablet 600 mg (has no administration in time range)  metoprolol tartrate (LOPRESSOR) tablet 25 mg (has no administration in time range)  rosuvastatin (CRESTOR) tablet 20 mg (has no administration in time range)  mirtazapine (REMERON SOL-TAB) disintegrating tablet 15 mg (has no administration in time range)  DULoxetine (CYMBALTA) DR capsule 20 mg (has no administration in time range)  sodium bicarbonate tablet 1,300 mg (has no administration in time range)  tamsulosin (FLOMAX) capsule 0.4 mg (has no administration in time range)  ferrous sulfate tablet 325 mg  (has no administration in time range)  cyclobenzaprine (FLEXERIL) tablet 5 mg (has no administration in time range)   stroke: early stages of recovery book (has no administration in time range)  acetaminophen (TYLENOL) tablet 650 mg (has no administration in time range)    Or  acetaminophen (TYLENOL) 160 MG/5ML solution 650 mg (has no administration in time range)    Or  acetaminophen (TYLENOL) suppository 650 mg (has no administration in time range)  heparin injection 5,000 Units (has no administration in time range)  ondansetron (ZOFRAN) injection 4 mg (has no administration in time range)  senna-docusate (Senokot-S) tablet 1 tablet (has no administration in time range)  0.9 %  sodium chloride infusion ( Intravenous New Bag/Given 08/24/22 0851)    Mobility walks with device

## 2022-08-24 NOTE — ED Notes (Signed)
Patient transported to MRI 

## 2022-08-24 NOTE — Progress Notes (Signed)
SLP Cancellation Note  Patient Details Name: Antonio Adams MRN: [id number] DOB: 22-Jan-1946   Cancelled treatment:       Reason Eval/Treat Not Completed: Other (comment) (Order for speech evaluation received, patient to transfer to [facility] will continue efforts to see him for evaluation.  Thank you)  Rolena Infante, MS [facility] SLP Acute Rehab Services Office [phone]  Chales Abrahams 08/24/2022, 1:48 PM

## 2022-08-24 NOTE — ED Notes (Signed)
Pt back in room, placed on cm sp02 and nibp, call bell in reach. Family member at bedside.

## 2022-08-24 NOTE — ED Notes (Signed)
CARELINK  CALLED  

## 2022-08-24 NOTE — ED Notes (Signed)
REPORT GIVEN TO Melburn Hake RN

## 2022-08-24 NOTE — ED Provider Notes (Signed)
[place] EMERGENCY DEPARTMENT AT [facility] Provider Note   CSN: [id number] Arrival date & time: 08/24/22  2585     History  Chief Complaint  Patient presents with   Altered Mental Status    Antonio Adams is a 77 y.o. male.  Pt is a 77 yo male with pmhx significant for hld, ckd, htn, chronic anemia, arthritis, gerd, dm2, glaucoma, depression, sleep apnea, CVA, prostate cancer, stomach cancer, kidney cancer and PTSD.  Pt said he called his fiancee last night and told her that things were "not right in his room."  She told him to go to bed and things should get better.  She said he called him again around 0600 and was still saying things were not right in the room.  He felt like things were moved around and he did not move it.  She came over to his house and thought he needed to be checked out.  Pt denies any other neurologic sx.  Pt's fiancee said he did just recently get an injection for his anemia in CKD.  That is the only thing that is new.       Home Medications Prior to Admission medications   Medication Sig Start Date End Date Taking? Authorizing Provider  acetaminophen (TYLENOL) 500 MG tablet Take 1,000 mg by mouth every 6 (six) hours as needed for mild pain.    [provider]  Apoaequorin (PREVAGEN EXTRA STRENGTH PO) Take 1 tablet by mouth daily.    [provider]  aspirin EC 81 MG tablet Take 1 tablet (81 mg total) by mouth daily. Swallow whole. 04/09/22   Ghimire, Werner Lean, MD  calcitRIOL (ROCALTROL) 0.25 MCG capsule Take 0.25 mcg by mouth daily. 01/05/22   [provider]  Cyanocobalamin (B-12 PO) Take 1,000 mcg by mouth daily.    [provider]  cyclobenzaprine (FLEXERIL) 5 MG tablet Take 1 tablet (5 mg total) by mouth 2 (two) times daily as needed for muscle spasms. 03/19/22   Blane Ohara, MD  DULoxetine (CYMBALTA) 20 MG capsule Take 1 capsule (20 mg total) by mouth daily. 05/29/22   Pincus Sanes, MD  ferrous  sulfate 325 (65 FE) MG tablet Take 1 tablet (325 mg total) by mouth 2 (two) times daily. Patient needs office visit for further refills Patient taking differently: Take 325 mg by mouth daily with breakfast. 02/24/19   Hilarie Fredrickson, MD  fluocinonide-emollient (LIDEX-E) 0.05 % cream Apply 1 Application topically 2 (two) times daily as needed (dry skin).    [provider]  furosemide (LASIX) 40 MG tablet TAKE 1 TABLET BY MOUTH EVERY DAY 06/22/22   Etta Grandchild, MD  gemfibrozil (LOPID) 600 MG tablet Take 600 mg by mouth 2 (two) times daily before a meal. 02/01/22   [provider]  LOKELMA 10 g PACK packet Take 10 g by mouth daily. 05/04/21   [provider]  metoprolol tartrate (LOPRESSOR) 25 MG tablet TAKE 1 TABLET BY MOUTH TWICE A DAY 06/22/22   Jodelle Red, MD  mirtazapine (REMERON SOL-TAB) 15 MG disintegrating tablet Take 15 mg by mouth at bedtime.    [provider]  Omega-3 Fatty Acids (FISH OIL PO) Take 1,000 mg by mouth 2 (two) times a week.    [provider]  OVER THE COUNTER MEDICATION Take 1 tablet by mouth daily. Focus factor    [provider]  pantoprazole (PROTONIX) 20 MG tablet Take 1 tablet (20 mg total) by  mouth daily. Patient taking differently: Take 20 mg by mouth daily as needed for heartburn or indigestion. 01/08/22   Azucena Fallen, MD  polyvinyl alcohol (LIQUIFILM TEARS) 1.4 % ophthalmic solution Place 1 drop into both eyes daily as needed for dry eyes.    [provider]  rosuvastatin (CRESTOR) 20 MG tablet Take 1 tablet (20 mg total) by mouth daily. 04/09/22   Ghimire, Werner Lean, MD  sildenafil (VIAGRA) 100 MG tablet Take 100 mg by mouth daily as needed. 05/02/22   [provider]  sodium bicarbonate 650 MG tablet Take 2 tablets (1,300 mg total) by mouth 2 (two) times daily. 04/09/22   Ghimire, Werner Lean, MD  solifenacin (VESICARE) 5 MG tablet Take 1 tablet (5 mg total) by mouth daily.  06/13/22   Etta Grandchild, MD  tamsulosin (FLOMAX) 0.4 MG CAPS capsule TAKE 1 CAPSULE (0.4 MG TOTAL) BY MOUTH DAILY. Patient taking differently: Take 0.4 mg by mouth daily. 01/20/14   Etta Grandchild, MD  zinc gluconate 50 MG tablet Take 1 tablet (50 mg total) by mouth daily. 12/20/21   Etta Grandchild, MD      Allergies    Ace inhibitors, Gabapentin, Xyzal [levocetirizine], and Opdivo [nivolumab]    Review of Systems   Review of Systems  All other systems reviewed and are negative.   Physical Exam Updated Vital Signs BP (!) 178/99   Pulse 73   Temp 97.9 F (36.6 C) (Oral)   Resp 16   Ht 6\' 3"  (1.905 m)   Wt 93 kg   SpO2 98%   BMI 25.62 kg/m  Physical Exam Vitals and nursing note reviewed.  Constitutional:      Appearance: Normal appearance.  HENT:     Head: Normocephalic and atraumatic.     Right Ear: External ear normal.     Left Ear: External ear normal.     Nose: Nose normal.     Mouth/Throat:     Mouth: Mucous membranes are moist.     Pharynx: Oropharynx is clear.  Eyes:     Extraocular Movements: Extraocular movements intact.     Conjunctiva/sclera: Conjunctivae normal.     Pupils: Pupils are equal, round, and reactive to light.  Cardiovascular:     Rate and Rhythm: Normal rate and regular rhythm.     Pulses: Normal pulses.     Heart sounds: Normal heart sounds.  Pulmonary:     Effort: Pulmonary effort is normal.     Breath sounds: Normal breath sounds.  Abdominal:     General: Abdomen is flat. Bowel sounds are normal.     Palpations: Abdomen is soft.  Musculoskeletal:        General: Normal range of motion.     Cervical back: Normal range of motion and neck supple.  Skin:    General: Skin is warm.     Capillary Refill: Capillary refill takes less than 2 seconds.  Neurological:     General: No focal deficit present.     Mental Status: He is alert and oriented to person, place, and time.  Psychiatric:        Mood and Affect: Mood normal.         Behavior: Behavior normal.     ED Results / Procedures / Treatments   Labs (all labs ordered are listed, but only abnormal results are displayed) Labs Reviewed  CBC WITH DIFFERENTIAL/PLATELET - Abnormal; Notable for the following components:      Result  Value   WBC 3.7 (*)    RBC 3.38 (*)    Hemoglobin 10.4 (*)    HCT 32.2 (*)    RDW 15.7 (*)    nRBC 0.5 (*)    Lymphs Abs 0.6 (*)    All other components within normal limits  COMPREHENSIVE METABOLIC PANEL - Abnormal; Notable for the following components:   CO2 19 (*)    BUN 74 (*)    Creatinine, Ser 9.48 (*)    Albumin 3.4 (*)    GFR, Estimated 5 (*)    All other components within normal limits  URINALYSIS, ROUTINE W REFLEX MICROSCOPIC - Abnormal; Notable for the following components:   Color, Urine STRAW (*)    Glucose, UA 150 (*)    Protein, ur 100 (*)    All other components within normal limits  AMMONIA  CBG MONITORING, ED    EKG EKG Interpretation  Date/Time:  Thursday August 24 2022 08:53:36 EDT Ventricular Rate:  69 PR Interval:  207 QRS Duration: 84 QT Interval:  406 QTC Calculation: 435 R Axis:   19 Text Interpretation: Sinus rhythm Nonspecific T abnormalities, lateral leads Minimal ST elevation, anterior leads No significant change since last tracing Confirmed by Jacalyn Lefevre [phone]) on 08/24/2022 9:30:31 AM  Radiology MR BRAIN WO CONTRAST  Result Date: 08/24/2022 CLINICAL DATA:  Neuro deficit, acute, stroke suspected EXAM: MRI HEAD WITHOUT CONTRAST TECHNIQUE: Multiplanar, multiecho pulse sequences of the brain and surrounding structures were obtained without intravenous contrast. COMPARISON:  Same day CT head. FINDINGS: Brain: Acute left parieto-occipital periventricular infarcts in the region of prior infarct. No evidence of acute hemorrhage, mass lesion, midline shift or hydrocephalus. Cerebral atrophy. Scattered T2/FLAIR hyperintensities the white matter, compatible with chronic microvascular ischemic  change. Vascular: Major arterial flow voids are maintained at the skull base. Skull and upper cervical spine: Normal marrow signal. Sinuses/Orbits: Clear sinuses.  No acute findings. Other: No mastoid effusions. IMPRESSION: Acute left parieto-occipital periventricular infarcts in the region of prior infarct. Electronically Signed   By: Feliberto Harts M.D.   On: 08/24/2022 11:35   CT Head Wo Contrast  Result Date: 08/24/2022 CLINICAL DATA:  Mental status change, unknown cause. EXAM: CT HEAD WITHOUT CONTRAST TECHNIQUE: Contiguous axial images were obtained from the base of the skull through the vertex without intravenous contrast. RADIATION DOSE REDUCTION: This exam was performed according to the departmental dose-optimization program which includes automated exposure control, adjustment of the mA and/or kV according to patient size and/or use of iterative reconstruction technique. COMPARISON:  Head CT 04/07/2022 and MRI 04/08/2022 FINDINGS: Brain: A moderate-sized chronic left PCA infarct is present involving the left occipital lobe and thalamus (acute to subacute on the prior studies). A lacunar infarct in the right thalamus is new and of indeterminate acuity. No acute cortically based infarct intracranial hemorrhage, mass, midline shift, or extra-axial fluid collection is identified. There is a background of mild chronic small vessel ischemia in the cerebral white matter. There is mild-to-moderate cerebral atrophy. Vascular: Calcified atherosclerosis at the skull base. No hyperdense vessel. Skull: No fracture or suspicious osseous lesion. Sinuses/Orbits: Visualized paranasal sinuses and mastoid air cells are clear. Unremarkable orbits. Other: None. IMPRESSION: 1. Age-indeterminate right thalamic lacunar infarct. 2. Chronic left PCA infarct. Electronically Signed   By: Sebastian Ache M.D.   On: 08/24/2022 09:07   DG Chest 2 View  Result Date: 08/24/2022 CLINICAL DATA:  Altered level of consciousness. EXAM:  CHEST - 2 VIEW COMPARISON:  One-view chest x-ray 04/07/2022  FINDINGS: The heart is mildly enlarged. Mild pulmonary vascular congestion is present. No frank edema or focal airspace disease is present. The visualized soft tissues and bony thorax are unremarkable. IMPRESSION: Mild cardiomegaly and pulmonary vascular congestion without frank edema. Electronically Signed   By: Marin Roberts M.D.   On: 08/24/2022 08:33    Procedures Procedures    Medications Ordered in ED Medications  aspirin tablet 325 mg (has no administration in time range)  clopidogrel (PLAVIX) tablet 75 mg (has no administration in time range)  0.9 %  sodium chloride infusion ( Intravenous New Bag/Given 08/24/22 0851)    ED Course/ Medical Decision Making/ A&P                             Medical Decision Making Amount and/or Complexity of Data Reviewed Labs: ordered. Radiology: ordered.  Risk Prescription drug management. Decision regarding hospitalization.   This patient presents to the ED for concern of ams, this involves an extensive number of treatment options, and is a complaint that carries with it a high risk of complications and morbidity.  The differential diagnosis includes infection, uremia, cva   Co morbidities that complicate the patient evaluation   hld, ckd, htn, chronic anemia, arthritis, gerd, dm2, glaucoma, depression, sleep apnea, prostate cancer, CVA, stomach cancer, kidney cancer and PTSD   Additional history obtained:  Additional history obtained from epic chart review External records from outside source obtained and reviewed including fiancee   Lab Tests:  I Ordered, and personally interpreted labs.  The pertinent results include:  cbc with hgb 10.4 (hgb 9.7 on 4/8), cmp with bun 74 and cr 9.48 (bun 45 and cr 7.28 on 04/14/22); ua nl   Imaging Studies ordered:  I ordered imaging studies including cxr, ct head, mri brain  I independently visualized and interpreted imaging  which showed  CXR: Mild cardiomegaly and pulmonary vascular congestion without frank  edema.  CT head: Age-indeterminate right thalamic lacunar infarct.  2. Chronic left PCA infarct.  MRI: Acute left parieto-occipital periventricular infarcts in the region  of prior infarct.   I agree with the radiologist interpretation   Cardiac Monitoring:  The patient was maintained on a cardiac monitor.  I personally viewed and interpreted the cardiac monitored which showed an underlying rhythm of: nsr   Medicines ordered and prescription drug management:  I ordered medication including asa  for cva  Reevaluation of the patient after these medicines showed that the patient improved I have reviewed the patients home medicines and have made adjustments as needed   Test Considered:  MRI   Critical Interventions:  mri   Consultations Obtained:  I requested consultation with the neurologist (Dr. Selina Cooley),  and discussed lab and imaging findings as well as pertinent plan - she recommends admission to [facility] Pt d/w Dr. Erenest Blank ([facility]) who will admit.   Problem List / ED Course:  CKD:  worsening.  Pt is not interested in dialysis. Acute CVA:  pt is out of the window for tnk and has no lvo for IR   Reevaluation:  After the interventions noted above, I reevaluated the patient and found that they have :improved   Social Determinants of Health:  Lives at home   Dispostion:  After consideration of the diagnostic results and the patients response to treatment, I feel that the patent would benefit from admission.          Final Clinical Impression(s) / ED  Diagnoses Final diagnoses:  CKD (chronic kidney disease) stage 5, GFR less than 15 ml/min  Cerebrovascular accident (CVA), unspecified mechanism    Rx / DC Orders ED Discharge Orders     None         Jacalyn LefevreHaviland, [place], MD 08/24/22 1222

## 2022-08-25 ENCOUNTER — Inpatient Hospital Stay (HOSPITAL_COMMUNITY): Payer: Medicare Other

## 2022-08-25 ENCOUNTER — Inpatient Hospital Stay (HOSPITAL_BASED_OUTPATIENT_CLINIC_OR_DEPARTMENT_OTHER): Payer: Medicare Other

## 2022-08-25 DIAGNOSIS — I6523 Occlusion and stenosis of bilateral carotid arteries: Secondary | ICD-10-CM | POA: Diagnosis not present

## 2022-08-25 DIAGNOSIS — N400 Enlarged prostate without lower urinary tract symptoms: Secondary | ICD-10-CM

## 2022-08-25 DIAGNOSIS — I6389 Other cerebral infarction: Secondary | ICD-10-CM

## 2022-08-25 DIAGNOSIS — I639 Cerebral infarction, unspecified: Secondary | ICD-10-CM | POA: Diagnosis present

## 2022-08-25 DIAGNOSIS — R4189 Other symptoms and signs involving cognitive functions and awareness: Secondary | ICD-10-CM

## 2022-08-25 DIAGNOSIS — D631 Anemia in chronic kidney disease: Secondary | ICD-10-CM | POA: Diagnosis not present

## 2022-08-25 DIAGNOSIS — R410 Disorientation, unspecified: Secondary | ICD-10-CM | POA: Diagnosis not present

## 2022-08-25 DIAGNOSIS — N185 Chronic kidney disease, stage 5: Secondary | ICD-10-CM | POA: Diagnosis not present

## 2022-08-25 LAB — ECHOCARDIOGRAM COMPLETE
AR max vel: 1.49 cm2
AV Area VTI: 1.53 cm2
AV Area mean vel: 1.41 cm2
AV Mean grad: 11 mmHg
AV Peak grad: 18.9 mmHg
Ao pk vel: 2.18 m/s
Area-P 1/2: 3.31 cm2
Height: 75 in
S' Lateral: 4 cm
Weight: 3068.8 oz

## 2022-08-25 LAB — CBC
HCT: 32.3 % — ABNORMAL LOW (ref 39.0–52.0)
Hemoglobin: 10.6 g/dL — ABNORMAL LOW (ref 13.0–17.0)
MCH: 31 pg (ref 26.0–34.0)
MCHC: 32.8 g/dL (ref 30.0–36.0)
MCV: 94.4 fL (ref 80.0–100.0)
Platelets: 216 10*3/uL (ref 150–400)
RBC: 3.42 MIL/uL — ABNORMAL LOW (ref 4.22–5.81)
RDW: 15.9 % — ABNORMAL HIGH (ref 11.5–15.5)
WBC: 3.4 10*3/uL — ABNORMAL LOW (ref 4.0–10.5)
nRBC: 0 % (ref 0.0–0.2)

## 2022-08-25 LAB — BASIC METABOLIC PANEL
Anion gap: 11 (ref 5–15)
BUN: 67 mg/dL — ABNORMAL HIGH (ref 8–23)
CO2: 18 mmol/L — ABNORMAL LOW (ref 22–32)
Calcium: 9 mg/dL (ref 8.9–10.3)
Chloride: 110 mmol/L (ref 98–111)
Creatinine, Ser: 9.18 mg/dL — ABNORMAL HIGH (ref 0.61–1.24)
GFR, Estimated: 5 mL/min — ABNORMAL LOW (ref >60)
Glucose, Bld: 76 mg/dL (ref 70–99)
Potassium: 5.1 mmol/L (ref 3.5–5.1)
Sodium: 139 mmol/L (ref 135–145)

## 2022-08-25 LAB — LIPID PANEL
Cholesterol: 227 mg/dL — ABNORMAL HIGH (ref 0–200)
HDL: 47 mg/dL (ref >40)
LDL Cholesterol: 164 mg/dL — ABNORMAL HIGH (ref 0–99)
Total CHOL/HDL Ratio: 4.8 RATIO
Triglycerides: 79 mg/dL (ref <150)
VLDL: 16 mg/dL (ref 0–40)

## 2022-08-25 LAB — HEMOGLOBIN A1C
Hgb A1c MFr Bld: 5.4 % (ref 4.8–5.6)
Mean Plasma Glucose: 108.28 mg/dL

## 2022-08-25 MED ORDER — ATORVASTATIN CALCIUM 80 MG PO TABS: 80.0000 mg | ORAL_TABLET | Freq: Every day | ORAL | 1 refills | Status: DC

## 2022-08-25 MED ORDER — ASPIRIN 81 MG PO TBEC: 81.0000 mg | DELAYED_RELEASE_TABLET | Freq: Every day | ORAL | 0 refills | Status: AC

## 2022-08-25 MED ORDER — ROSUVASTATIN CALCIUM 20 MG PO TABS: 40.0000 mg | ORAL_TABLET | Freq: Every day | ORAL | Status: DC

## 2022-08-25 MED ORDER — CLOPIDOGREL BISULFATE 75 MG PO TABS: 75.0000 mg | ORAL_TABLET | Freq: Every day | ORAL | 1 refills | Status: DC

## 2022-08-25 MED ORDER — ATORVASTATIN CALCIUM 80 MG PO TABS: 80.0000 mg | ORAL_TABLET | Freq: Every day | ORAL | Status: DC

## 2022-08-25 NOTE — Evaluation (Signed)
Physical Therapy Evaluation Patient Details Name: Antonio Adams MRN: [id number] DOB: Dec 14, 1945 Today's Date: 08/25/2022  History of Present Illness  Pt is a 77 y.o. male who presented 08/24/22 with confusion/hallucinations. MRI showed acute left parieto-occipital periventricular infarcts in the region of prior infarct. Suspect hallucinations are related to elevated Bun in the setting of worsening kidney function. PMH: ESRD not on HD, renal cancer metastatic to liver, adrenals and lymph nodes no longer on chemo, GERD, L PCA infarct November 2023, DM2, and anemia.   Clinical Impression  Pt presents with condition above and deficits mentioned below, see PT Problem List. PTA, he was mod I using his SPC vs no AD, living alone in a 1-level house with a ramped entrance. His fiance comes daily from around noon to night and assists with cooking, cleaning, and medication and financial management. Per fiance, pt is disoriented to date at baseline and has memory deficits. Pt does demonstrate deficits in memory, orientation to date, safety awareness, processing speed, R leg strength (chronic per fiance, pt reports it is acute), balance, power, and activity tolerance. Pt is requiring min guard-supervision for all functional mobility at this time for pt safety. He is at risk for falls and tends to carry his [facility] rather than use it. Fiance reports she is able to stay with pt 24/7 upon d/c. Pt would benefit from further acute PT services and follow-up PT upon d/c.       Recommendations for follow up therapy are one component of a multi-disciplinary discharge planning process, led by the attending physician.  Recommendations may be updated based on patient status, additional functional criteria and insurance authorization.  Follow Up Recommendations       Assistance Recommended at Discharge Frequent or constant Supervision/Assistance  Patient can return home with the following  A little help with  bathing/dressing/bathroom;Assistance with cooking/housework;Help with stairs or ramp for entrance;Assist for transportation;Direct supervision/assist for medications management;Direct supervision/assist for financial management;A little help with walking and/or transfers    Equipment Recommendations Other (comment) (tub bench)  Recommendations for Other Services       Functional Status Assessment Patient has had a recent decline in their functional status and demonstrates the ability to make significant improvements in function in a reasonable and predictable amount of time.     Precautions / Restrictions Precautions Precautions: Fall Restrictions Weight Bearing Restrictions: No      Mobility  Bed Mobility Overal bed mobility: Needs Assistance Bed Mobility: Sit to Supine       Sit to supine: Supervision, HOB elevated   General bed mobility comments: Pt standing in room upon arrival, no bed alarm on. Supervision for safety with return to supine for transport to take pt to MRI.    Transfers Overall transfer level: Needs assistance Equipment used: None Transfers: Sit to/from Stand Sit to Stand: Supervision           General transfer comment: Pt standing in room upon arrival, no bed alarm on. Cued pt to sit and then stand again, supervision for safety, no LOB but slow to rise    Ambulation/Gait Ambulation/Gait assistance: Min guard Gait Distance (Feet): 20 Feet Assistive device: None, Straight cane Gait Pattern/deviations: Step-through pattern, Decreased stride length, Shuffle Gait velocity: reduced Gait velocity interpretation: <1.31 ft/sec, indicative of household ambulator   General Gait Details: Pt taking slow, small shuffling steps around bed, limited in distance by transport arriving to take to MRI. Pt often holding SPC off floor instead of using it. Mild trunk  sway, but no LOB, min guard for safety  Stairs            Wheelchair Mobility    Modified  Rankin (Stroke Patients Only) Modified Rankin (Stroke Patients Only) Pre-Morbid Rankin Score: Moderate disability Modified Rankin: Moderate disability     Balance Overall balance assessment: Mild deficits observed, not formally tested                                           Pertinent Vitals/Pain Pain Assessment Pain Assessment: No/denies pain    Home Living Family/patient expects to be discharged to:: Private residence Living Arrangements: Alone Available Help at Discharge: Other (Comment);Available 24 hours/day (fiance) Type of Home: House Home Access: Ramped entrance       Home Layout: One level Home Equipment: Cane - single point Additional Comments: Fiance able to stay with pt 24/7 if needed, typically comes daily around noon to night    Prior Function Prior Level of Function : Independent/Modified Independent             Mobility Comments: SPC vs no AD, no falls in past 6 months ADLs Comments: Does not drive; does not cook; can straighten room up but does not clean; memory deficits and disoriented to date at baseline; fiance manages pill box but pt would take all AM, noon, and PM pills together so now fiance only fills the AM pill box then administers the noon and PM ones when she is there     Hand Dominance   Dominant Hand: Right    Extremity/Trunk Assessment   Upper Extremity Assessment Upper Extremity Assessment: Defer to OT evaluation    Lower Extremity Assessment Lower Extremity Assessment: RLE deficits/detail RLE Deficits / Details: MMT scores of 4 hip flexion (4+ L), 4+ (5 L) knee extension with fiance reporting a hx of R leg weakness but pt denying this; denied numbness/tingling; coordination grossly intact       Communication   Communication: No difficulties  Cognition Arousal/Alertness: Awake/alert Behavior During Therapy: WFL for tasks assessed/performed Overall Cognitive Status: History of cognitive impairments - at  baseline                                 General Comments: "May" for month then when asked what year 2x he stated "April" each time. Otherwise oriented; slow processing; fiance not present until pt left so unclear if this is baseline or still not there yet        General Comments General comments (skin integrity, edema, etc.): educated fiance on recs for 24/7 supervision initially upon d/c, she is in agreement    Exercises     Assessment/Plan    PT Assessment Patient needs continued PT services  PT Problem List Decreased strength;Decreased activity tolerance;Decreased balance;Decreased mobility;Decreased cognition       PT Treatment Interventions DME instruction;Gait training;Functional mobility training;Therapeutic activities;Therapeutic exercise;Balance training;Neuromuscular re-education;Cognitive remediation;Patient/family education    PT Goals (Current goals can be found in the Care Plan section)  Acute Rehab PT Goals Patient Stated Goal: did not state PT Goal Formulation: With patient/family Time For Goal Achievement: 09/08/22 Potential to Achieve Goals: Good    Frequency Min 4X/week     Co-evaluation               AM-PAC PT "6 Clicks" Mobility  Outcome Measure Help needed turning from your back to your side while in a flat bed without using bedrails?: None Help needed moving from lying on your back to sitting on the side of a flat bed without using bedrails?: A Little Help needed moving to and from a bed to a chair (including a wheelchair)?: A Little Help needed standing up from a chair using your arms (e.g., wheelchair or bedside chair)?: A Little Help needed to walk in hospital room?: A Little Help needed climbing 3-5 steps with a railing? : A Little 6 Click Score: 19    End of Session   Activity Tolerance: Patient tolerated treatment well Patient left: in bed;Other (comment);with family/visitor present (with transport taking pt to MRI)    PT Visit Diagnosis: Unsteadiness on feet (R26.81);Other abnormalities of gait and mobility (R26.89);Muscle weakness (generalized) (M62.81)    Time: 1610-9604 PT Time Calculation (min) (ACUTE ONLY): 15 min   Charges:   PT Evaluation $PT Eval Moderate Complexity: 1 Mod          Raymond Gurney, PT, DPT Acute Rehabilitation Services  Office: [phone]   Jewel Baize 08/25/2022, 12:09 PM

## 2022-08-25 NOTE — Progress Notes (Addendum)
STROKE TEAM PROGRESS NOTE   INTERVAL HISTORY Patient is seen in his room with no family at the bedside.  On 4/10, he took a nap and then woke up with confusion and hallucinations.  He then presented to the emergency department.  Confusion and hallucinations have improved, but MRI brain shows small acute left parietal occipital infarct.  Patient does not currently remember the circumstances of his presentation. MR angiogram of the brain is pending.  LDL cholesterol is elevated at 164 and hemoglobin A1c of 5.4. Vitals:   08/24/22 2100 08/24/22 2312 08/25/22 0307 08/25/22 0720  BP: (!) 159/93 (!) 166/94 (!) 177/100 (!) 180/101  Pulse: 66 67 77 68  Resp:    18  Temp: 98.4 F (36.9 C) (!) 97.5 F (36.4 C) 98.8 F (37.1 C) 98.4 F (36.9 C)  TempSrc: Oral Oral Oral Oral  SpO2: 98% 100% 99% 100%  Weight:      Height:       CBC:  Recent Labs  Lab 08/24/22 0852 08/25/22 0928  WBC 3.7* 3.4*  NEUTROABS 2.4  --   HGB 10.4* 10.6*  HCT 32.2* 32.3*  MCV 95.3 94.4  PLT 225 216   Basic Metabolic Panel:  Recent Labs  Lab 08/24/22 0852 08/25/22 0928  NA 135 139  K 4.9 5.1  CL 107 110  CO2 19* 18*  GLUCOSE 99 76  BUN 74* 67*  CREATININE 9.48* 9.18*  CALCIUM 9.0 9.0   Lipid Panel:  Recent Labs  Lab 08/25/22 0647  CHOL 227*  TRIG 79  HDL 47  CHOLHDL 4.8  VLDL 16  LDLCALC 175*   HgbA1c:  Recent Labs  Lab 08/25/22 0647  HGBA1C 5.4   Urine Drug Screen: No results for input(s): "LABOPIA", "COCAINSCRNUR", "LABBENZ", "AMPHETMU", "THCU", "LABBARB" in the last 168 hours.  Alcohol Level No results for input(s): "ETH" in the last 168 hours.  IMAGING past 24 hours MR ANGIO HEAD WO CONTRAST  Result Date: 08/25/2022 CLINICAL DATA:  Confusion and hallucinations. EXAM: MRA HEAD WITHOUT CONTRAST TECHNIQUE: Angiographic images of the Circle of Willis were acquired using MRA technique without intravenous contrast. COMPARISON:  Brain MRA 04/08/2022 FINDINGS: Image quality at the skull base  is degraded by motion artifact. Anterior circulation: Irregularity of the high cervical internal carotid arteries is favored artifactual. The intracranial ICAs appear patent, without hemodynamically significant stenosis or occlusion. The bilateral MCAs are patent, without proximal high-grade stenosis or occlusion. The bilateral ACAs are patent, without proximal high-grade stenosis or occlusion. The right A1 segment is diminutive, unchanged. There is no definite aneurysm or AVM. Posterior circulation: The bilateral V4 segments are patent. The basilar artery is patent. The bilateral PCAs are patent, without proximal high-grade stenosis or occlusion. The major cerebellar arteries appear patent. Posterior communicating arteries are not definitely seen. There is no aneurysm or AVM. Anatomic variants: As above. Other: None. IMPRESSION: Patent intracranial vasculature without evidence of hemodynamically significant stenosis or occlusion. Electronically Signed   By: Lesia Hausen M.D.   On: 08/25/2022 13:35   VAS US CAROTID  Result Date: 08/25/2022 Carotid Arterial Duplex Study Patient Name:  NAWEED DOLLARHIDE  Date of Exam:   08/25/2022 Medical Rec #: [id number]            Accession #:    [id number] Date of Birth: 1945/07/26           Patient Gender: M Patient Age:   7 years Exam Location:  [facility] Procedure:  VAS US CAROTID Referring Phys: [facility] --------------------------------------------------------------------------------  Indications:       CVA. Risk Factors:      Hypertension, hyperlipidemia, Diabetes, prior CVA. Comparison Study:  Previous 04/08/22. Performing Technologist: McKayla Maag RVT, [place]  Examination Guidelines: A complete evaluation includes B-mode imaging, spectral Doppler, color Doppler, and power Doppler as needed of all accessible portions of each vessel. Bilateral testing is considered an integral part of a complete examination. Limited examinations for reoccurring  indications may be performed as noted.  Right Carotid Findings: +----------+--------+--------+--------+------------------+------------------+           PSV cm/sEDV cm/sStenosisPlaque DescriptionComments           +----------+--------+--------+--------+------------------+------------------+ CCA Prox  52      17                                intimal thickening +----------+--------+--------+--------+------------------+------------------+ CCA Distal51      11                                intimal thickening +----------+--------+--------+--------+------------------+------------------+ ICA Prox  29      11      1-39%   heterogenous                         +----------+--------+--------+--------+------------------+------------------+ ICA Mid   42      13                                                   +----------+--------+--------+--------+------------------+------------------+ ICA Distal40      14                                                   +----------+--------+--------+--------+------------------+------------------+ ECA       52      9                                                    +----------+--------+--------+--------+------------------+------------------+ +----------+--------+-------+----------------+-------------------+           PSV cm/sEDV cmsDescribe        Arm Pressure (mmHG) +----------+--------+-------+----------------+-------------------+ [id number]             Multiphasic, WNL                    +----------+--------+-------+----------------+-------------------+ +---------+--------+--+--------+--+---------+ VertebralPSV cm/s25EDV cm/s10Antegrade +---------+--------+--+--------+--+---------+  Left Carotid Findings: +---------+--------+-------+--------+------------------------+-----------------+          PSV cm/sEDV    StenosisPlaque Description      Comments                           cm/s                                                      +---------+--------+-------+--------+------------------------+-----------------+  CCA Prox 88      18                                     intimal                                                                   thickening        +---------+--------+-------+--------+------------------------+-----------------+ CCA      45      12                                     intimal           Distal                                                  thickening        +---------+--------+-------+--------+------------------------+-----------------+ ICA Prox 44      9      1-39%   heterogenous and                                                          irregular                                 +---------+--------+-------+--------+------------------------+-----------------+ ICA Mid  41      19                                                       +---------+--------+-------+--------+------------------------+-----------------+ ICA      58      22                                                       Distal                                                                    +---------+--------+-------+--------+------------------------+-----------------+ ECA      30      6                                                        +---------+--------+-------+--------+------------------------+-----------------+ +----------+--------+--------+----------------+-------------------+  PSV cm/sEDV cm/sDescribe        Arm Pressure (mmHG) +----------+--------+--------+----------------+-------------------+ [id number]              Multiphasic, WNL                    +----------+--------+--------+----------------+-------------------+ +---------+--------+--+--------+--+---------+ VertebralPSV cm/s38EDV cm/s10Antegrade +---------+--------+--+--------+--+---------+   Summary: Right Carotid: Velocities in the right ICA are consistent  with a 1-39% stenosis. Left Carotid: Velocities in the left ICA are consistent with a 1-39% stenosis. Vertebrals:  Bilateral vertebral arteries demonstrate antegrade flow. Subclavians: Normal flow hemodynamics were seen in bilateral subclavian              arteries. *See table(s) above for measurements and observations.  Electronically signed by Delia Heady MD on 08/25/2022 at 1:17:16 PM.    Final    ECHOCARDIOGRAM COMPLETE  Result Date: 08/25/2022    ECHOCARDIOGRAM REPORT   Patient Name:   MIKA ANASTASI Date of Exam: 08/25/2022 Medical Rec #:  [id number]           Height:       75.0 in Accession #:    [id number]          Weight:       191.8 lb Date of Birth:  [birth year]          BSA:          2.156 m Patient Age:    76 years            BP:           180/101 mmHg Patient Gender: M                   HR:           62 bpm. Exam Location:  Inpatient Procedure: 2D Echo, 3D Echo, Color Doppler and Cardiac Doppler Indications:    Stroke I63.9  History:        Patient has prior history of Echocardiogram examinations, most                 recent 04/08/2022. Stroke; Risk Factors:Diabetes, Dyslipidemia,                 Non-Smoker and Hypertension.  Sonographer:    Aron Baba Referring Phys: [id number] MIR [facility]  Sonographer Comments: Suboptimal subcostal window. Image acquisition challenging due to respiratory motion. IMPRESSIONS  1. Left ventricular ejection fraction, by estimation, is 50 to 55%. The left ventricle has low normal function. The left ventricle has no regional wall motion abnormalities. Left ventricular diastolic parameters are consistent with Grade I diastolic dysfunction (impaired relaxation).  2. Right ventricular systolic function is normal. The right ventricular size is normal. Tricuspid regurgitation signal is inadequate for assessing PA pressure.  3. Left atrial size was mild to moderately dilated.  4. No evidence of mitral valve regurgitation.  5. The aortic valve was not well visualized.  Aortic valve regurgitation is not visualized.  6. There is mild dilatation of the aortic root, measuring 40 mm.  7. The inferior vena cava is dilated in size with >50% respiratory variability, suggesting right atrial pressure of 8 mmHg. FINDINGS  Left Ventricle: Left ventricular ejection fraction, by estimation, is 50 to 55%. The left ventricle has low normal function. The left ventricle has no regional wall motion abnormalities. The left ventricular internal cavity size was normal in size. There is no left ventricular hypertrophy. Left ventricular diastolic parameters are consistent with Grade I diastolic dysfunction (impaired  relaxation). Right Ventricle: The right ventricular size is normal. Right ventricular systolic function is normal. Tricuspid regurgitation signal is inadequate for assessing PA pressure. Left Atrium: Left atrial size was mild to moderately dilated. Right Atrium: Right atrial size was normal in size. Pericardium: There is no evidence of pericardial effusion. Mitral Valve: No evidence of mitral valve regurgitation. Tricuspid Valve: Tricuspid valve regurgitation is not demonstrated. Aortic Valve: The aortic valve was not well visualized. Aortic valve regurgitation is not visualized. Aortic valve mean gradient measures 11.0 mmHg. Aortic valve peak gradient measures 18.9 mmHg. Aortic valve area, by VTI measures 1.53 cm. Pulmonic Valve: Pulmonic valve regurgitation is not visualized. Aorta: There is mild dilatation of the aortic root, measuring 40 mm. Venous: The inferior vena cava is dilated in size with greater than 50% respiratory variability, suggesting right atrial pressure of 8 mmHg.  LEFT VENTRICLE PLAX 2D LVIDd:         5.60 cm   Diastology LVIDs:         4.00 cm   LV e' medial:    4.82 cm/s LV PW:         1.20 cm   LV E/e' medial:  15.6 LV IVS:        0.70 cm   LV e' lateral:   5.70 cm/s LVOT diam:     2.40 cm   LV E/e' lateral: 13.2 LV SV:         76 LV SV Index:   35 LVOT Area:     4.52  cm                           3D Volume EF:                          3D EF:        46 %                          LV EDV:       198 ml                          LV ESV:       107 ml                          LV SV:        91 ml RIGHT VENTRICLE RV S prime:     6.96 cm/s TAPSE (M-mode): 1.8 cm LEFT ATRIUM             Index        RIGHT ATRIUM           Index LA diam:        4.00 cm 1.86 cm/m   RA Area:     24.70 cm LA Vol (A2C):   67.8 ml 31.45 ml/m  RA Volume:   85.60 ml  39.71 ml/m LA Vol (A4C):   90.2 ml 41.84 ml/m LA Biplane Vol: 78.0 ml 36.18 ml/m  AORTIC VALVE AV Area (Vmax):    1.49 cm AV Area (Vmean):   1.41 cm AV Area (VTI):     1.53 cm AV Vmax:           217.50 cm/s AV Vmean:          155.000 cm/s AV VTI:  0.496 m AV Peak Grad:      18.9 mmHg AV Mean Grad:      11.0 mmHg LVOT Vmax:         71.80 cm/s LVOT Vmean:        48.300 cm/s LVOT VTI:          0.168 m LVOT/AV VTI ratio: 0.34  AORTA Ao Root diam: 4.00 cm Ao Asc diam:  3.80 cm MITRAL VALVE MV Area (PHT): 3.31 cm    SHUNTS MV Decel Time: 229 msec    Systemic VTI:  0.17 m MV E velocity: 75.20 cm/s  Systemic Diam: 2.40 cm MV A velocity: 77.10 cm/s MV E/A ratio:  0.98 Photographer signed by Carolan Clines Signature Date/Time: 08/25/2022/11:23:51 AM    Final     PHYSICAL EXAM General: Alert, well-nourished, well-developed pleasant elderly African-American male in no acute distress Respiratory: Regular, unlabored respirations on room air  NEURO:  Mental Status: AA&Ox2, does not remember circumstances of his presentation to the hospital Speech/Language: speech is without dysarthria or aphasia.    Cranial Nerves:  II: PERRL.  Right partial visual field cut, does not blink to threat on the right III, IV, VI: EOMI. Eyelids elevate symmetrically.  VII: Smile is symmetrical.  VIII: hearing intact to voice. IX, X: Phonation is normal.  XII: tongue is midline without fasciculations. Motor: 5/5 strength to all muscle groups  tested.  Tone: is normal and bulk is normal Sensation- Intact to light touch bilaterally.  Coordination: FTN intact bilaterally.No drift.  Gait- deferred     ASSESSMENT/PLAN [name] is a 77 y.o. male with history of left PCA stroke, metastatic renal cell carcinoma and GERD presenting with  confusion and hallucinations.  Confusion and hallucinations have improved, but MRI brain shows small acute left parietal occipital infarct.  Patient does not currently remember the circumstances of his presentation.  Stroke:  left parieto-occipital periventricular infarct.  Old left PCA infarct. Etiology: Likely small vessel disease CT head age-indeterminate right thalamic lacunar infarct and chronic left PCA infarct, no acute abnormality MRI acute left parieto-occipital periventricular infarct in the region of prior infarct MRA no LVO or hemodynamically significant stenosis Carotid Doppler 1 to 39% stenosis in bilateral ICAs 2D Echo EF 50 to 55%, grade 1 diastolic dysfunction, mild to moderate dilation of left atrium LDL 164 HgbA1c 5.4 VTE prophylaxis - SQH    Diet   Diet renal with fluid restriction Room service appropriate? Yes; Fluid consistency: Thin   aspirin 81 mg daily prior to admission, now on aspirin 81 mg daily and clopidogrel 75 mg daily for 3 weeks then Plavix alone.  Uncertain compliance with this regimen at home. Therapy recommendations: Outpatient PT Disposition: Pending  Hypertension Home meds: Metoprolol 25 mg twice daily Stable Permissive hypertension (OK if < 220/120) but gradually normalize in 5-7 days Long-term BP goal normotensive  Hyperlipidemia Home meds: Rosuvastatin 20 mg daily, uncertain compliance, increase dose to 40 mg daily LDL 164, goal < 70 Continue statin at discharge   Other Stroke Risk Factors Advanced Age >/= 48   Other Active Problems Metastatic renal cell carcinoma-follow-up with outpatient oncologist  Hospital day # 1  [place]  E Ernestina Columbia , MSN, AGACNP-BC [facility] See Amion for schedule and pager information 08/25/2022 1:51 PM  STROKE MD NOTE :  I have personally obtained history,examined this patient, reviewed notes, independently viewed imaging studies, participated in medical decision making and plan of care.ROS completed by me personally  and pertinent positives fully documented  I have made any additions or clarifications directly to the above note. Agree with note above.  Patient presented with transient confusion and hallucinations upon awakening from sleep without focal weakness.  This appears to have improved and neurological exam significant only for possible right peripheral field cut which was possibly old from his previous left PCA infarct.  MRI does show a small left periventricular parieto-occipital lacunar stroke.  Aspirin and Plavix with a week for Plavix alone and aggressive risk factor modification.  Check MRA brain results.  Long discussion patient about his presentation and discussed results of imaging findings and stroke evaluation and prevention and answering questions.  Discussed with Dr. Alanda Slim.  Greater than 50% time during this 50-minute visit with spent in counseling and coordination of care about his stroke discussion about evaluation and prevention and treatment and answering questions.  Stroke team will sign off.  Kindly call for questions.  Delia Heady, MD Medical Director [facility] Pager: [phone] 08/25/2022 2:02 PM   To contact Stroke Continuity provider, please refer to [email or web address]. After hours, contact General Neurology

## 2022-08-25 NOTE — Evaluation (Signed)
Occupational Therapy Evaluation Patient Details Name: Antonio Adams MRN: [id number] DOB: Dec 06, 1945 Today's Date: 08/25/2022   History of Present Illness Pt is a 77 y.o. male who presented 08/24/22 with confusion/hallucinations. MRI showed acute left parieto-occipital periventricular infarcts in the region of prior infarct. Suspect hallucinations are related to elevated Bun in the setting of worsening kidney function. PMH: ESRD not on HD, renal cancer metastatic to liver, adrenals and lymph nodes no longer on chemo, GERD, L PCA infarct November 2023, DM2, and anemia.   Clinical Impression   PTA, pt lived alone and fiance assisted with IADL. Upon eval, pt presents with decreased balance, RUE strength, safety, and R upper field vision. Pt performing ADL with supervision during session. Pt requiring one verbal cue to locate silverware on his tray. Pt with poor delayed memory recall and problem solving. Recommending OP OT to optimize safety and independence in ADL and IADL.      Recommendations for follow up therapy are one component of a multi-disciplinary discharge planning process, led by the attending physician.  Recommendations may be updated based on patient status, additional functional criteria and insurance authorization.   Assistance Recommended at Discharge Frequent or constant Supervision/Assistance  Patient can return home with the following A little help with walking and/or transfers;A little help with bathing/dressing/bathroom;Assistance with cooking/housework;Direct supervision/assist for medications management;Direct supervision/assist for financial management;Assist for transportation;Help with stairs or ramp for entrance    Functional Status Assessment  Patient has had a recent decline in their functional status and demonstrates the ability to make significant improvements in function in a reasonable and predictable amount of time.  Equipment Recommendations  BSC/3in1     Recommendations for Other Services       Precautions / Restrictions Precautions Precautions: Fall Restrictions Weight Bearing Restrictions: No      Mobility Bed Mobility               General bed mobility comments: up in room on arrival with fiance in room and no bed alarm on. EOB eating on departure    Transfers Overall transfer level: Needs assistance Equipment used: None Transfers: Sit to/from Stand Sit to Stand: Supervision           General transfer comment: Slow to rise      Balance Overall balance assessment: Mild deficits observed, not formally tested                                         ADL either performed or assessed with clinical judgement   ADL Overall ADL's : Needs assistance/impaired Eating/Feeding: Sitting;Modified independent Eating/Feeding Details (indicate cue type and reason): one cue to locate utensil pouch lying on white napkin Grooming: Supervision/safety;Wash/dry face;Wash/dry hands;Standing   Upper Body Bathing: Set up;Sitting   Lower Body Bathing: Supervison/ safety;Sit to/from stand   Upper Body Dressing : Set up;Sitting   Lower Body Dressing: Supervision/safety;Sit to/from stand Lower Body Dressing Details (indicate cue type and reason): Able to change socks sitting EOB Toilet Transfer: Supervision/safety;Ambulation Toilet Transfer Details (indicate cue type and reason): In restrioom on arrival Toileting- Architect and Hygiene: Set up;Sitting/lateral lean   Tub/ Shower Transfer: Minimal assistance;Ambulation Tub/Shower Transfer Details (indicate cue type and reason): Min A to bring second LE up and over edge of tub Functional mobility during ADLs: Supervision/safety       Vision Baseline Vision/History: 1 Wears glasses Ability to See in  Adequate Light: 1 Impaired Patient Visual Report: No change from baseline Vision Assessment?: Vision impaired- to be further tested in functional  context;Yes Eye Alignment: Within Functional Limits Ocular Range of Motion: Within Functional Limits Tracking/Visual Pursuits: Able to track stimulus in all quads without difficulty Convergence: Impaired (comment) Visual Fields: Other (comment) (R superior visual field deficit; pt fiance reports this has been going on since last CVA.)     Perception     Praxis      Pertinent Vitals/Pain Pain Assessment Pain Assessment: No/denies pain     Hand Dominance Right   Extremity/Trunk Assessment Upper Extremity Assessment Upper Extremity Assessment: Generalized weakness;RUE deficits/detail RUE Deficits / Details: RUE 4/5grip, 4/5 push/ 4+/5 pull   Lower Extremity Assessment Lower Extremity Assessment: Defer to PT evaluation   Cervical / Trunk Assessment Cervical / Trunk Assessment: Normal   Communication Communication Communication: No difficulties   Cognition Arousal/Alertness: Awake/alert Behavior During Therapy: WFL for tasks assessed/performed Overall Cognitive Status: History of cognitive impairments - at baseline                                 General Comments: Pt's fiance reports pt near baseline. Poor delayed memory recall; able to recall 3/3 with indirect cues/hints (the first one was a color), able to count bakward from 10. Able to report on general home set-up     General Comments  fiance in agreeance to 24/7 supervision when first returning home    Exercises     Shoulder Instructions      Home Living Family/patient expects to be discharged to:: Private residence Living Arrangements: Alone Available Help at Discharge: Other (Comment);Available 24 hours/day (fiance, Antonio Adams) Type of Home: House Home Access: Ramped entrance     Home Layout: One level     Bathroom Shower/Tub: Chief Strategy Officer: Standard     Home Equipment: Cane - single point   Additional Comments: Fiance able to stay with pt 24/7 if needed, typically comes  daily around noon to night  Lives With: Significant other    Prior Functioning/Environment Prior Level of Function : Independent/Modified Independent             Mobility Comments: SPC vs no AD, no falls in past 6 months ADLs Comments: Does not drive; does not cook; can straighten room up but does not clean; memory deficits and disoriented to date at baseline; fiance manages pill box but pt would take all AM, noon, and PM pills together so now fiance only fills the AM pill box then administers the noon and PM ones when she is there        OT Problem List: Decreased strength;Decreased activity tolerance;Impaired balance (sitting and/or standing);Decreased cognition;Decreased coordination;Decreased safety awareness      OT Treatment/Interventions: Self-care/ADL training;DME and/or AE instruction;Therapeutic exercise;Therapeutic activities;Patient/family education;Balance training;Cognitive remediation/compensation;Visual/perceptual remediation/compensation    OT Goals(Current goals can be found in the care plan section) Acute Rehab OT Goals Patient Stated Goal: go home OT Goal Formulation: With patient Time For Goal Achievement: 09/08/22 Potential to Achieve Goals: Fair  OT Frequency: Min 2X/week    Co-evaluation              AM-PAC OT "6 Clicks" Daily Activity     Outcome Measure Help from another person eating meals?: None Help from another person taking care of personal grooming?: A Little Help from another person toileting, which includes using toliet, bedpan, or  urinal?: A Little Help from another person bathing (including washing, rinsing, drying)?: A Little Help from another person to put on and taking off regular upper body clothing?: A Little Help from another person to put on and taking off regular lower body clothing?: A Little 6 Click Score: 19   End of Session Nurse Communication: Mobility status  Activity Tolerance: Patient tolerated treatment well Patient  left: in bed;with call bell/phone within reach;with family/visitor present  OT Visit Diagnosis: Unsteadiness on feet (R26.81);Muscle weakness (generalized) (M62.81);Low vision, both eyes (H54.2)                Time: 9021-1155 OT Time Calculation (min): 22 min Charges:  OT General Charges $OT Visit: 1 Visit OT Evaluation $OT Eval Moderate Complexity: 1 Mod  Antonio Adams, OTR/L [facility] Acute Rehabilitation Office: [phone]   Antonio Adams 08/25/2022, 4:55 PM

## 2022-08-25 NOTE — Care Management CC44 (Signed)
Condition Code 44 Documentation Completed  Patient Details  Name: Antonio Adams MRN: [id number] Date of Birth: [birth year]   Condition Code 44 given:  Yes Patient signature on Condition Code 44 notice:  Yes Documentation of 2 MD's agreement:  Yes Code 44 added to claim:  Yes    Kermit Balo, RN 08/25/2022, 4:26 PM

## 2022-08-25 NOTE — Progress Notes (Signed)
Mobility Specialist: Progress Note   08/25/22 1432  Mobility  Activity Ambulated with assistance in hallway  Level of Assistance Contact guard assist, steadying assist  Assistive Device Cane  Distance Ambulated (ft) 500 ft  Activity Response Tolerated well  Mobility Referral Yes  $Mobility charge 1 Mobility   Pt received sitting EOB and agreeable to mobility. Mod I to stand and contact guard during ambulation. Difficulty avoiding objects on his Rt x3 but able to self recover. No c/o throughout. Pt sitting EOB after session with call bell and phone in reach.   Antonio Adams Mobility Specialist Please contact via SecureChat or Rehab office at [phone]

## 2022-08-25 NOTE — Plan of Care (Signed)
  Problem: Education: Goal: Knowledge of disease or condition will improve 08/25/2022 1621 by [name], RN Outcome: Adequate for Discharge 08/25/2022 1247 by [name], RN Outcome: Progressing Goal: Knowledge of secondary prevention will improve (MUST DOCUMENT ALL) Outcome: Adequate for Discharge Goal: Knowledge of patient specific risk factors will improve Antonio Adams N/A or DELETE if not current risk factor) Outcome: Adequate for Discharge   Problem: Ischemic Stroke/TIA Tissue Perfusion: Goal: Complications of ischemic stroke/TIA will be minimized Outcome: Adequate for Discharge   Problem: Coping: Goal: Will verbalize positive feelings about self Outcome: Adequate for Discharge Goal: Will identify appropriate support needs Outcome: Adequate for Discharge   Problem: Health Behavior/Discharge Planning: Goal: Ability to manage health-related needs will improve Outcome: Adequate for Discharge Goal: Goals will be collaboratively established with patient/family Outcome: Adequate for Discharge   Problem: Self-Care: Goal: Ability to participate in self-care as condition permits will improve Outcome: Adequate for Discharge Goal: Verbalization of feelings and concerns over difficulty with self-care will improve Outcome: Adequate for Discharge Goal: Ability to communicate needs accurately will improve Outcome: Adequate for Discharge   Problem: Nutrition: Goal: Risk of aspiration will decrease Outcome: Adequate for Discharge Goal: Dietary intake will improve Outcome: Adequate for Discharge   Problem: Education: Goal: Knowledge of General Education information will improve Description: Including pain rating scale, medication(s)/side effects and non-pharmacologic comfort measures Outcome: Adequate for Discharge   Problem: Health Behavior/Discharge Planning: Goal: Ability to manage health-related needs will improve Outcome: Adequate for Discharge   Problem: Clinical  Measurements: Goal: Ability to maintain clinical measurements within normal limits will improve Outcome: Adequate for Discharge Goal: Will remain free from infection Outcome: Adequate for Discharge Goal: Diagnostic test results will improve Outcome: Adequate for Discharge Goal: Respiratory complications will improve Outcome: Adequate for Discharge Goal: Cardiovascular complication will be avoided Outcome: Adequate for Discharge   Problem: Activity: Goal: Risk for activity intolerance will decrease Outcome: Adequate for Discharge   Problem: Nutrition: Goal: Adequate nutrition will be maintained Outcome: Adequate for Discharge   Problem: Coping: Goal: Level of anxiety will decrease Outcome: Adequate for Discharge   Problem: Elimination: Goal: Will not experience complications related to bowel motility Outcome: Adequate for Discharge Goal: Will not experience complications related to urinary retention Outcome: Adequate for Discharge   Problem: Pain Managment: Goal: General experience of comfort will improve Outcome: Adequate for Discharge   Problem: Safety: Goal: Ability to remain free from injury will improve Outcome: Adequate for Discharge   Problem: Skin Integrity: Goal: Risk for impaired skin integrity will decrease Outcome: Adequate for Discharge   Problem: Acute Rehab PT Goals(only PT should resolve) Goal: Patient Will Transfer Sit To/From Stand Outcome: Adequate for Discharge Goal: Pt Will Ambulate Outcome: Adequate for Discharge Goal: Pt/caregiver will Perform Home Exercise Program Outcome: Adequate for Discharge Goal: PT Additional Goal #1 Outcome: Adequate for Discharge

## 2022-08-25 NOTE — Progress Notes (Signed)
OT Cancellation Note  Patient Details Name: Antonio Adams MRN: [id number] DOB: 12-19-1945   Cancelled Treatment:    Reason Eval/Treat Not Completed: Patient at procedure or test/ unavailable Pt at MRI. Will follow up for OT eval. Lorre Munroe 08/25/2022, 12:19 PM

## 2022-08-25 NOTE — Consult Note (Signed)
NEUROLOGY CONSULTATION NOTE   Date of service: August 25, 2022 Patient Name: Antonio Adams MRN:  [id number] DOB:  11/01/1945 Reason for consult: "left parieto-occipital white matter infarcts" Requesting Provider: Maryln Gottron, MD _ _ _   _ __   _ __ _ _  __ __   _ __   __ _  History of Present Illness  Antonio Adams is a 77 y.o. male with PMH significant for GERD, prior L PCA stroke, metastatic RCC, who presents with episode of confusion/hallucinations.  Per fiance at the bedside, he took a nap in the evening on 08/23/22. When he woke up, he was confused, seeing things. Much better now. He had MRI Brain w/o C at [facility] hospital ED where he initially presented and demonstrated a small acute left parieto-occipital periventricular infarcts in the region of prior infarct.  Fiance, reports that he has not been as sharp as he used to be and has certainly declined over the last few months. Does not eat much vegetables but does take a multivitamin at home everyday.  Neurology consulted for further evaluation and workup.  LKW: 1700 on 08/23/22. mRS: 2 tNKASE: not offered, symptoms not explained by the noted stroke. Thrombectomy: not offered, symptoms not explained by the noted stroke. NIHSS components Score: Comment  1a Level of Conscious 0[x]  1[]  2[]  3[]      1b LOC Questions 0[]  1[x]  2[]       1c LOC Commands 0[x]  1[]  2[]       2 Best Gaze 0[x]  1[]  2[]       3 Visual 0[]  1[]  2[x]  3[]    Tunneled vision  4 Facial Palsy 0[x]  1[]  2[]  3[]      5a Motor Arm - left 0[x]  1[]  2[]  3[]  4[]  UN[]    5b Motor Arm - Right 0[x]  1[]  2[]  3[]  4[]  UN[]    6a Motor Leg - Left 0[x]  1[]  2[]  3[]  4[]  UN[]    6b Motor Leg - Right 0[x]  1[]  2[]  3[]  4[]  UN[]    7 Limb Ataxia 0[x]  1[]  2[]  3[]  UN[]     8 Sensory 0[x]  1[]  2[]  UN[]      9 Best Language 0[x]  1[]  2[]  3[]      10 Dysarthria 0[x]  1[]  2[]  UN[]      11 Extinct. and Inattention 0[x]  1[]  2[]       TOTAL: 3      ROS   Constitutional Denies weight loss, fever  and chills.   HEENT + changes in vision, intact hearing.   Respiratory Denies SOB and cough.   CV Denies palpitations and CP   GI Denies abdominal pain, nausea, vomiting and diarrhea.   GU Denies dysuria and urinary frequency.   MSK Denies myalgia and joint pain.   Skin Denies rash and pruritus.   Neurological Denies headache and syncope.   Psychiatric Denies recent changes in mood. Denies anxiety and depression.    Past History   Past Medical History:  Diagnosis Date   ADENOCARCINOMA, PROSTATE, GLEASON GRADE 6 12/21/2009   prostate cancer   ALLERGIC RHINITIS 03/16/2007   Allergy    Anxiety    Arthritis    back    BACK PAIN WITH RADICULOPATHY 11/23/2008   Cancer of kidney    partial right kidney removed   CARPAL TUNNEL SYNDROME, BILATERAL 03/16/2007   Chronic back pain    Constipation    takes Senokot daily   Depression    occasionally   DIABETES MELLITUS, TYPE II 03/16/2007   takes Januvia and MEtformin daily  GASTROINTESTINAL HEMORRHAGE, HX OF 03/16/2007   GERD 03/16/2007   takes Omeprazole daily   Glaucoma    mild - no eye drops   Hemorrhoids    History of colon polyps    HYPERLIPIDEMIA 03/16/2007   takes Crestor daily   HYPERTENSION 03/16/2007   takes Diltiazem and Lisinopril daily    Kidney cancer, primary, with metastasis from kidney to other site    LEG CRAMPS 05/22/2007   Nocturia    Overactive bladder    PARESTHESIA 05/21/2007   Proctitis    PTSD (post-traumatic stress disorder)    wakes up may be dreaming of fighting   Rectal bleeding    Dr.Norins has explained its from the Radiation that he has received   Rectal bleeding    Renal cell carcinoma    Sleep apnea    uses CPAP nightly   Stomach cancer    Urinary frequency    takes Toviaz daily   Past Surgical History:  Procedure Laterality Date   ADRENALECTOMY Right 02/26/2020   Procedure: OPEN RIGHT RENAL CELL METASTATECTOMY AND  ADRENALECTOMY;  Surgeon: Crist Fat, MD;  Location: [facility];  Service:  Urology;  Laterality: Right;   BACK SURGERY  1983, 2008   repeat surgery; ESI '08   CARPAL TUNNEL RELEASE     bilateral   CERVICAL FUSION     COLONOSCOPY     colonosocpy     ESOPHAGOGASTRODUODENOSCOPY     ED   FLEXIBLE SIGMOIDOSCOPY N/A 01/08/2017   Procedure: FLEXIBLE SIGMOIDOSCOPY;  Surgeon: Hilarie Fredrickson, MD;  Location: [facility] ENDOSCOPY;  Service: Endoscopy;  Laterality: N/A;   HOT HEMOSTASIS N/A 01/08/2017   Procedure: HOT HEMOSTASIS (ARGON PLASMA COAGULATION/BICAP);  Surgeon: Hilarie Fredrickson, MD;  Location: [facility] ENDOSCOPY;  Service: Endoscopy;  Laterality: N/A;   IR RADIOLOGIST EVAL & MGMT  12/17/2019   IR RADIOLOGIST EVAL & MGMT  07/01/2020   IR RADIOLOGIST EVAL & MGMT  08/25/2020   LUMBAR LAMINECTOMY  10/30/2011   Procedure: MICRODISCECTOMY LUMBAR LAMINECTOMY;  Surgeon: Kerrin Champagne, MD;  Location: [facility] OR;  Service: Orthopedics;  Laterality: Right;  Right L4-5 and L5-S1 Microdiscectomy   LUMBAR LAMINECTOMY  06/17/2012   Procedure: MICRODISCECTOMY LUMBAR LAMINECTOMY;  Surgeon: Kerrin Champagne, MD;  Location: [facility] OR;  Service: Orthopedics;  Laterality: N/A;  Right L5-S1 microdiscectomy   OPEN PARTIAL HEPATECTOMY  N/A 02/26/2020   Procedure: OPEN PARTIAL HEPATECTOMY, CHOLECYSTECTOMY;  Surgeon: Almond Lint, MD;  Location: [facility];  Service: General;  Laterality: N/A;   POLYPECTOMY     PROSTATE SURGERY  march 2012   seed implant   RADIOFREQUENCY ABLATION N/A 07/21/2020   Procedure: CT MICROWAVE ABLATION;  Surgeon: Sterling Big, MD;  Location: [facility];  Service: Anesthesiology;  Laterality: N/A;   ROBOTIC ASSITED PARTIAL NEPHRECTOMY Right 09/08/2015   Procedure: XI ROBOTIC ASSITED RIGHT PARTIAL NEPHRECTOMY;  Surgeon: Crist Fat, MD;  Location: [facility];  Service: Urology;  Laterality: Right;  Clamp on: 1033 Clamp off: 1103 Total Clamp Time: 30 minutes   SHOULDER ARTHROSCOPY WITH SUBACROMIAL DECOMPRESSION Left 01/20/2016   Procedure: LEFT SHOULDER ARTHROSCOPY WITH EXTENSIVE  DEBRIDEMENT,  ACROMIOPLASTY;  Surgeon: Loreta Ave, MD;  Location: [facility];  Service: Orthopedics;  Laterality: Left;   SHOULDER SURGERY  LFT   Stress Cardiolite  12/24/2003   negative for ischemia   UPPER ESOPHAGEAL ENDOSCOPIC ULTRASOUND (EUS)  04/18/2016   [facility]   UPPER GASTROINTESTINAL ENDOSCOPY     WOUND EXPLORATION  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   Family History  Problem Relation Age of Onset   Cancer Sister 30       Cancer, unsure type   Hypertension Other    Colon cancer Neg Hx    Esophageal cancer Neg Hx    Stomach cancer Neg Hx    Diabetes Neg Hx    Colon polyps Neg Hx    Rectal cancer Neg Hx    Social History   Socioeconomic History   Marital status: Widowed    Spouse name: Pattricia Boss   Number of children: 3   Years of education: 12+   Highest education level: Not on file  Occupational History   Occupation: Patent examiner; Chief Strategy Officer: RETIRED    Comment: retired from Patent examiner  Tobacco Use   Smoking status: Never   Smokeless tobacco: Never  Vaping Use   Vaping Use: Never used  Substance and Sexual Activity   Alcohol use: Not Currently    Alcohol/week: 0.0 standard drinks of alcohol   Drug use: No   Sexual activity: Not Currently    Partners: Female  Other Topics Concern   Not on file  Social History Narrative   Married-divorced '89, remarried '96. 2 sons, 1 daughter. 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 He wishes to be a full resuscitation candidate.       Social Determinants of Health   Financial Resource Strain: Low Risk  (01/06/2022)   Overall Financial Resource Strain (CARDIA)    Difficulty of Paying Living Expenses: Not hard at all  Food Insecurity: No Food Insecurity (04/08/2022)   Hunger Vital Sign    Worried About Running Out of Food in the Last Year: Never true    Ran Out of Food in the Last Year: Never true  Transportation  Needs: No Transportation Needs (04/08/2022)   PRAPARE - Administrator, Civil Service (Medical): No    Lack of Transportation (Non-Medical): No  Physical Activity: Inactive (01/06/2022)   Exercise Vital Sign    Days of Exercise per Week: 0 days    Minutes of Exercise per Session: 0 min  Stress: No Stress Concern Present (01/06/2022)   [employer] of Occupational Health - Occupational Stress Questionnaire    Feeling of Stress : Not at all  Social Connections: Moderately Integrated (01/06/2022)   Social Connection and Isolation Panel [NHANES]    Frequency of Communication with Friends and Family: More than three times a week    Frequency of Social Gatherings with Friends and Family: More than three times a week    Attends Religious Services: Never    Database administrator or Organizations: Yes    Attends Engineer, structural: More than 4 times per year    Marital Status: Living with partner   Allergies  Allergen Reactions   Ace Inhibitors Swelling    lisinopril   Gabapentin Other (See Comments)    hallucinations   Xyzal [Levocetirizine] Other (See Comments)    Break out   Opdivo [Nivolumab] Rash    Medications   Medications Prior to Admission  Medication Sig Dispense Refill Last Dose   acetaminophen (TYLENOL) 500 MG tablet Take 1,000 mg by mouth every 6 (six) hours as needed for mild pain.      Apoaequorin (PREVAGEN EXTRA STRENGTH PO) Take 1 tablet by mouth daily.      aspirin EC  81 MG tablet Take 1 tablet (81 mg total) by mouth daily. Swallow whole. 30 tablet 12    calcitRIOL (ROCALTROL) 0.25 MCG capsule Take 0.25 mcg by mouth daily.      Cyanocobalamin (B-12 PO) Take 1,000 mcg by mouth daily.      cyclobenzaprine (FLEXERIL) 5 MG tablet Take 1 tablet (5 mg total) by mouth 2 (two) times daily as needed for muscle spasms. 6 tablet 0    DULoxetine (CYMBALTA) 20 MG capsule Take 1 capsule (20 mg total) by mouth daily. 30 capsule 5    ferrous sulfate 325 (65  FE) MG tablet Take 1 tablet (325 mg total) by mouth 2 (two) times daily. Patient needs office visit for further refills (Patient taking differently: Take 325 mg by mouth daily with breakfast.) 60 tablet 1    fluocinonide-emollient (LIDEX-E) 0.05 % cream Apply 1 Application topically 2 (two) times daily as needed (dry skin).      furosemide (LASIX) 40 MG tablet TAKE 1 TABLET BY MOUTH EVERY DAY 90 tablet 1    gemfibrozil (LOPID) 600 MG tablet Take 600 mg by mouth 2 (two) times daily before a meal.      LOKELMA 10 g PACK packet Take 10 g by mouth daily.      metoprolol tartrate (LOPRESSOR) 25 MG tablet TAKE 1 TABLET BY MOUTH TWICE A DAY 180 tablet 3    mirtazapine (REMERON SOL-TAB) 15 MG disintegrating tablet Take 15 mg by mouth at bedtime.      Omega-3 Fatty Acids (FISH OIL PO) Take 1,000 mg by mouth 2 (two) times a week.      OVER THE COUNTER MEDICATION Take 1 tablet by mouth daily. Focus factor      pantoprazole (PROTONIX) 20 MG tablet Take 1 tablet (20 mg total) by mouth daily. (Patient taking differently: Take 20 mg by mouth daily as needed for heartburn or indigestion.) 30 tablet 0    polyvinyl alcohol (LIQUIFILM TEARS) 1.4 % ophthalmic solution Place 1 drop into both eyes daily as needed for dry eyes.      rosuvastatin (CRESTOR) 20 MG tablet Take 1 tablet (20 mg total) by mouth daily. 30 tablet 1    sildenafil (VIAGRA) 100 MG tablet Take 100 mg by mouth daily as needed.      sodium bicarbonate 650 MG tablet Take 2 tablets (1,300 mg total) by mouth 2 (two) times daily. 60 tablet 1    solifenacin (VESICARE) 5 MG tablet Take 1 tablet (5 mg total) by mouth daily. 90 tablet 1    tamsulosin (FLOMAX) 0.4 MG CAPS capsule TAKE 1 CAPSULE (0.4 MG TOTAL) BY MOUTH DAILY. (Patient taking differently: Take 0.4 mg by mouth daily.) 30 capsule 1    zinc gluconate 50 MG tablet Take 1 tablet (50 mg total) by mouth daily. 90 tablet 1      Vitals   Vitals:   08/24/22 1644 08/24/22 2100 08/24/22 2312 08/25/22  0307  BP: (!) 189/116 (!) 159/93 (!) 166/94 (!) 177/100  Pulse: 87 66 67 77  Resp: 18     Temp: 98.2 F (36.8 C) 98.4 F (36.9 C) (!) 97.5 F (36.4 C) 98.8 F (37.1 C)  TempSrc: Oral Oral Oral Oral  SpO2: 100% 98% 100% 99%  Weight:      Height:         Body mass index is 23.97 kg/m.  Physical Exam   General: Laying comfortably in bed; in no acute distress.  HENT: Normal oropharynx and mucosa.  Normal external appearance of ears and nose.  Neck: Supple, no pain or tenderness  CV: No JVD. No peripheral edema.  Pulmonary: Symmetric Chest rise. Normal respiratory effort.  Abdomen: Soft to touch, non-tender.  Ext: No cyanosis, edema, or deformity  Skin: No rash. Normal palpation of skin.   Musculoskeletal: Normal digits and nails by inspection. No clubbing.   Neurologic Examination  Mental status/Cognition: Alert, oriented to self, place, but not to month and year, poor attention. Unable to do simple calculations. Speech/language: Fluent, comprehension intact, object naming intact, repetition intact. Cranial nerves:   CN II Pupils equal and reactive to light, tunneled vision with inconsistent responses to peripheral visual field testing BL.   CN III,IV,VI EOM intact, no gaze preference or deviation, no nystagmus    CN V normal sensation in V1, V2, and V3 segments bilaterally    CN VII no asymmetry, no nasolabial fold flattening    CN VIII normal hearing to speech    CN IX & X normal palatal elevation, no uvular deviation    CN XI 5/5 head turn and 5/5 shoulder shrug bilaterally    CN XII midline tongue protrusion    Motor:  Muscle bulk: normal, tone none, pronator drift none tremor none Mvmt Root Nerve  Muscle Right Left Comments  SA C5/6 Ax Deltoid 5 5   EF C5/6 Mc Biceps 5 5   EE C6/7/8 Rad Triceps 5 5   WF C6/7 Med FCR     WE C7/8 PIN ECU     F Ab C8/T1 U ADM/FDI 5 5   HF L1/2/3 Fem Illopsoas 5 5   KE L2/3/4 Fem Quad 5 5   DF L4/5 D Peron Tib Ant 5 5   PF S1/2  Tibial Grc/Sol 5 5    Sensation:  Light touch Intact throughout   Pin prick    Temperature    Vibration   Proprioception    Coordination/Complex Motor:  - Finger to Nose intact BL - Heel to shin intact BL - Rapid alternating movement are normal - Gait: deferred  Labs   CBC:  Recent Labs  Lab 08/21/22 0915 08/24/22 0852  WBC  --  3.7*  NEUTROABS  --  2.4  HGB 9.7* 10.4*  HCT  --  32.2*  MCV  --  95.3  PLT  --  225    Basic Metabolic Panel:  Lab Results  Component Value Date   NA 135 08/24/2022   K 4.9 08/24/2022   CO2 19 (L) 08/24/2022   GLUCOSE 99 08/24/2022   BUN 74 (H) 08/24/2022   CREATININE 9.48 (H) 08/24/2022   CALCIUM 9.0 08/24/2022   GFRNONAA 5 (L) 08/24/2022   GFRAA 33 (L) 02/16/2020   Lipid Panel:  Lab Results  Component Value Date   LDLCALC 159 (H) 04/08/2022   HgbA1c:  Lab Results  Component Value Date   HGBA1C 5.4 04/08/2022   Urine Drug Screen: No results found for: "LABOPIA", "COCAINSCRNUR", "LABBENZ", "AMPHETMU", "THCU", "LABBARB"  Alcohol Level No results found for: "ETH"  CT Head without contrast(Personally reviewed): CTH was negative for a large hypodensity concerning for a large territory infarct or hyperdensity concerning for an ICH. Chronic L PCA stroke.  MR Angio head without contrast and Carotid Duplex BL: pending  MRI Brain(Personally reviewed): Acute left parieto-occipital periventricular infarcts in the region of prior infarct.  Impression   Antonio Adams is a 77 y.o. male with PMH significant for GERD, prior L PCA stroke, metastatic RCC,  who presents with episode of confusion/hallucinations.  Suspect hallucinations are related to elevated Bun in the setting of worsening kidney function. The noted L PCA stroke is incidental and does not explain his symptoms.  Will need stroke workup. Defer management of worsening kidney function to primary team.  Recommendations  - Frequent Neuro checks per stroke unit  protocol - Recommend Vascular imaging with MRA Angio Head without contrast and US Carotid doppler - Recommend obtaining TTE - Recommend obtaining Lipid panel with LDL - Please start statin if LDL > 70 - Recommend HbA1c to evaluate for diabetes and how well it is controlled. - Antithrombotic - aspirin 81mg  daily along with plavix 75mg  daily x 21 days, followed by Aspirin 81mg  daily alone. - Recommend DVT ppx - SBP goal - permissive hypertension first 24 h < 220/110. Held home meds.  - Recommend Telemetry monitoring for arrythmia - Recommend bedside swallow screen prior to PO intake. - Stroke education booklet - Recommend PT/OT/SLP consult  Defer management of worsening kidney function to primary team. I suspect this is the cause of hallucinations. ______________________________________________________________________   Thank you for the opportunity to take part in the care of this patient. If you have any further questions, please contact the neurology consultation attending.  Signed,  Erick Blinks [facility] Pager Number [phone] _ _ _   _ __   _ __ _ _  __ __   _ __   __ _

## 2022-08-25 NOTE — Care Management Obs Status (Signed)
MEDICARE OBSERVATION STATUS NOTIFICATION   Patient Details  Name: Antonio Adams MRN: [id number] Date of Birth: 08-24-1945   Medicare Observation Status Notification Given:  Yes    Kermit Balo, RN 08/25/2022, 4:26 PM

## 2022-08-25 NOTE — Progress Notes (Signed)
Bilateral carotid ultrasound study completed.   Please see CV Procedures for preliminary results.  Oswell Say, RVT  1:10 PM 08/25/22

## 2022-08-25 NOTE — Plan of Care (Signed)
  Problem: Education: Goal: Knowledge of disease or condition will improve Outcome: Progressing   

## 2022-08-25 NOTE — Discharge Summary (Signed)
Physician Discharge Summary  Antonio Adams AOZ:[id number] DOB: 1945/09/21 DOA: 08/24/2022  PCP: Etta Grandchild, MD  Admit date: 08/24/2022 Discharge date: 08/25/2022 Admitted From: Home Disposition: Home Recommendations for Outpatient Follow-up:  Follow up with PCP in 1 to 2 weeks Outpatient follow-up with neurology Check CMP and CBC at follow-up Outpatient follow-up with nephrology and urology as previously planned Please follow up on the following pending results: None  Home Health: Ambulatory referral for PT/OT/SLP ordered Equipment/Devices: Shower seat  Discharge Condition: Stable CODE STATUS: DNR/DNI  Follow-up Information     Etta Grandchild, MD. Schedule an appointment as soon as possible for a visit in 1 week(s).   Specialty: Internal Medicine Contact information: [address] [place] [address] [phone]         [facility]. Schedule an appointment as soon as possible for a visit in 4 week(s).   Specialty: Radiology Contact information: [address] [place] [address] [phone]        [facility]. Schedule an appointment as soon as possible for a visit in 1 week(s).   Specialty: Rehabilitation Contact information: [address] [id number] mc [place] [address] [phone]                Hospital course 77 year old M with PMH of renal cell cancer with metastasis, CKD-5 not on HD, left PCA CVA, GERD, BPH and cognitive impairment presenting with "spinning".  Patient has no recollection into what brought him to the hospital.  History provided by patient's fianc who stated that patient complained about spinning when she talked to him over the phone the day prior to presentation to ED.  In ED, stable vitals.  CT head without acute finding.  MRI brain showed acute left PCA CVA.  Neurology consulted and recommended transfer  to [facility] for further evaluation.  Patient underwent MRA, carotid ultrasound and TTE which did not show significant finding.  LDL 164.  A1c 5.4.   On the day of discharge, patient felt well.  Denied dizziness.  Exam with some right hemianopsia although difficult due to patient's difficulty understanding and performing instructions.  Evaluated by neurology who recommended aspirin and Plavix for 3 weeks followed by Plavix alone and aggressive risk reduction.  His Crestor was changed to Lipitor as well.  Counseled on the importance of taking his medications.  Patient has CKD-5 and has no intent or wish to pursue dialysis.  Also doubt candidacy due to metastatic renal carcinoma.  Recommend outpatient follow-up with nephrology.   Finding and plan discussed with patient's fianc at bedside prior to discharge.  Ambulatory referral to neurorehabilitation ordered for PT/OT/SLP.  See individual problem list below for more.   Problems addressed during this hospitalization Principal Problem:   Acute ischemic stroke Active Problems:   Metastatic renal cell carcinoma   GERD   Cognitive impairment   Diabetic polyneuropathy associated with type 2 diabetes mellitus   CKD (chronic kidney disease) stage 5, GFR less than 15 ml/min   BPH (benign prostatic hyperplasia)   Anemia due to chronic kidney disease   Hx of completed stroke              Time spent 35 minutes  Vital signs Vitals:   08/24/22 2312 08/25/22 0307 08/25/22 0720 08/25/22 1300  BP: (!) 166/94 (!) 177/100 (!) 180/101 (!) (P) 161/99  Pulse: 67 77 68 (P) 72  Temp: (!) 97.5 F (36.4 C) 98.8  F (37.1 C) 98.4 F (36.9 C) (P) 97.7 F (36.5 C)  Resp:   18 (P) 18  Height:      Weight:      SpO2: 100% 99% 100%   TempSrc: Oral Oral Oral (P) Oral  BMI (Calculated):         Discharge exam  GENERAL: No apparent distress.  Nontoxic. HEENT: MMM.  Vision and hearing grossly intact.  NECK: Supple.  No apparent JVD.  RESP:  No IWOB.   Fair aeration bilaterally. CVS: 2/6 SEM over RUSB.  Heart sounds normal.  ABD/GI/GU: BS+. Abd soft, NTND.  MSK/EXT:  Moves extremities. No apparent deformity. No edema.  SKIN: no apparent skin lesion or wound NEURO: Awake and alert. Oriented x 4 except a month and year.  Seems to have right hemianopsia but difficult exam due to patient's struggle to understand and follow instructions.  Remainder of neuroexam unrevealing. PSYCH: Calm. Normal affect.   Discharge Instructions Discharge Instructions     Ambulatory referral to Physical Therapy   Complete by: As directed    Diet - low sodium heart healthy   Complete by: As directed    Discharge instructions   Complete by: As directed    It has been a pleasure taking care of you!  You were hospitalized due to acute stroke for which you have been started on medications (Plavix and increased dose of Crestor in addition to home aspirin) to prevent further stroke recurrence.  It is very important that you take your medications as prescribed.  Follow-up with your primary care doctor in 1 to 2 weeks or sooner if needed.  Follow-up with neurology in 4 to 6 weeks.  In regards to kidney failure and kidney cancer, follow-up with nephrologist, urologist and oncologist as previously planned. Avoid any over-the-counter medication unless you talk to the doctors.   Take care,   Increase activity slowly   Complete by: As directed       Allergies as of 08/25/2022       Reactions   Ace Inhibitors Swelling   lisinopril   Gabapentin Other (See Comments)   hallucinations   Xyzal [levocetirizine] Other (See Comments)   Break out   Opdivo [nivolumab] Rash        Medication List     STOP taking these medications    gemfibrozil 600 MG tablet Commonly known as: LOPID   rosuvastatin 20 MG tablet Commonly known as: CRESTOR       TAKE these medications    acetaminophen 500 MG tablet Commonly known as: TYLENOL Take 1,000 mg by mouth every 6  (six) hours as needed for mild pain.   aspirin EC 81 MG tablet Take 1 tablet (81 mg total) by mouth daily for 21 days. Swallow whole.   atorvastatin 80 MG tablet Commonly known as: LIPITOR Take 1 tablet (80 mg total) by mouth daily. Start taking on: August 26, 2022   B-12 PO Take 1,000 mcg by mouth daily.   calcitRIOL 0.25 MCG capsule Commonly known as: ROCALTROL Take 0.25 mcg by mouth daily.   clopidogrel 75 MG tablet Commonly known as: PLAVIX Take 1 tablet (75 mg total) by mouth daily. Start taking on: August 26, 2022   cyclobenzaprine 5 MG tablet Commonly known as: FLEXERIL Take 1 tablet (5 mg total) by mouth 2 (two) times daily as needed for muscle spasms.   DULoxetine 20 MG capsule Commonly known as: Cymbalta Take 1 capsule (20 mg total) by mouth daily.  ferrous sulfate 325 (65 FE) MG tablet Take 1 tablet (325 mg total) by mouth 2 (two) times daily. Patient needs office visit for further refills What changed:  when to take this additional instructions   FISH OIL PO Take 1,000 mg by mouth 2 (two) times a week.   fluocinonide-emollient 0.05 % cream Commonly known as: LIDEX-E Apply 1 Application topically 2 (two) times daily as needed (dry skin).   furosemide 40 MG tablet Commonly known as: LASIX TAKE 1 TABLET BY MOUTH EVERY DAY   Lokelma 10 g Pack packet Generic drug: sodium zirconium cyclosilicate Take 10 g by mouth daily.   metoprolol tartrate 25 MG tablet Commonly known as: LOPRESSOR TAKE 1 TABLET BY MOUTH TWICE A DAY   mirtazapine 15 MG disintegrating tablet Commonly known as: REMERON SOL-TAB Take 15 mg by mouth at bedtime.   OVER THE COUNTER MEDICATION Take 1 tablet by mouth daily. Focus factor   pantoprazole 20 MG tablet Commonly known as: PROTONIX Take 1 tablet (20 mg total) by mouth daily. What changed:  when to take this reasons to take this   polyvinyl alcohol 1.4 % ophthalmic solution Commonly known as: LIQUIFILM TEARS Place 1 drop  into both eyes daily as needed for dry eyes.   PREVAGEN EXTRA STRENGTH PO Take 1 tablet by mouth daily.   sildenafil 100 MG tablet Commonly known as: VIAGRA Take 100 mg by mouth daily as needed.   sodium bicarbonate 650 MG tablet Take 2 tablets (1,300 mg total) by mouth 2 (two) times daily.   solifenacin 5 MG tablet Commonly known as: VESICARE Take 1 tablet (5 mg total) by mouth daily.   tamsulosin 0.4 MG Caps capsule Commonly known as: FLOMAX TAKE 1 CAPSULE (0.4 MG TOTAL) BY MOUTH DAILY.   zinc gluconate 50 MG tablet Take 1 tablet (50 mg total) by mouth daily.               Durable Medical Equipment  (From admission, onward)           Start     Ordered   08/25/22 1404  For home use only DME Shower stool  Once        08/25/22 1403            Consultations: Neurology  Procedures/Studies:   MR ANGIO HEAD WO CONTRAST  Result Date: 08/25/2022 CLINICAL DATA:  Confusion and hallucinations. EXAM: MRA HEAD WITHOUT CONTRAST TECHNIQUE: Angiographic images of the Circle of Willis were acquired using MRA technique without intravenous contrast. COMPARISON:  Brain MRA 04/08/2022 FINDINGS: Image quality at the skull base is degraded by motion artifact. Anterior circulation: Irregularity of the high cervical internal carotid arteries is favored artifactual. The intracranial ICAs appear patent, without hemodynamically significant stenosis or occlusion. The bilateral MCAs are patent, without proximal high-grade stenosis or occlusion. The bilateral ACAs are patent, without proximal high-grade stenosis or occlusion. The right A1 segment is diminutive, unchanged. There is no definite aneurysm or AVM. Posterior circulation: The bilateral V4 segments are patent. The basilar artery is patent. The bilateral PCAs are patent, without proximal high-grade stenosis or occlusion. The major cerebellar arteries appear patent. Posterior communicating arteries are not definitely seen. There is  no aneurysm or AVM. Anatomic variants: As above. Other: None. IMPRESSION: Patent intracranial vasculature without evidence of hemodynamically significant stenosis or occlusion. Electronically Signed   By: Lesia Hausen M.D.   On: 08/25/2022 13:35   VAS US CAROTID  Result Date: 08/25/2022 Carotid Arterial Duplex Study Patient Name:  Antonio Adams  Date of Exam:   08/25/2022 Medical Rec #: [id number]            Accession #:    [id number] Date of Birth: 04-02-1946           Patient Gender: M Patient Age:   54 years Exam Location:  [facility] Procedure:      VAS US CAROTID Referring Phys: Terrilee Files [facility] --------------------------------------------------------------------------------  Indications:       CVA. Risk Factors:      Hypertension, hyperlipidemia, Diabetes, prior CVA. Comparison Study:  Previous 04/08/22. Performing Technologist: McKayla Maag RVT, [place]  Examination Guidelines: A complete evaluation includes B-mode imaging, spectral Doppler, color Doppler, and power Doppler as needed of all accessible portions of each vessel. Bilateral testing is considered an integral part of a complete examination. Limited examinations for reoccurring indications may be performed as noted.  Right Carotid Findings: +----------+--------+--------+--------+------------------+------------------+           PSV cm/sEDV cm/sStenosisPlaque DescriptionComments           +----------+--------+--------+--------+------------------+------------------+ CCA Prox  52      17                                intimal thickening +----------+--------+--------+--------+------------------+------------------+ CCA Distal51      11                                intimal thickening +----------+--------+--------+--------+------------------+------------------+ ICA Prox  29      11      1-39%   heterogenous                         +----------+--------+--------+--------+------------------+------------------+ ICA Mid    42      13                                                   +----------+--------+--------+--------+------------------+------------------+ ICA Distal40      14                                                   +----------+--------+--------+--------+------------------+------------------+ ECA       52      9                                                    +----------+--------+--------+--------+------------------+------------------+ +----------+--------+-------+----------------+-------------------+           PSV cm/sEDV cmsDescribe        Arm Pressure (mmHG) +----------+--------+-------+----------------+-------------------+ [id number]             Multiphasic, WNL                    +----------+--------+-------+----------------+-------------------+ +---------+--------+--+--------+--+---------+ VertebralPSV cm/s25EDV cm/s10Antegrade +---------+--------+--+--------+--+---------+  Left Carotid Findings: +---------+--------+-------+--------+------------------------+-----------------+          PSV cm/sEDV    StenosisPlaque Description      Comments  cm/s                                                     +---------+--------+-------+--------+------------------------+-----------------+ CCA Prox 88      18                                     intimal                                                                   thickening        +---------+--------+-------+--------+------------------------+-----------------+ CCA      45      12                                     intimal           Distal                                                  thickening        +---------+--------+-------+--------+------------------------+-----------------+ ICA Prox 44      9      1-39%   heterogenous and                                                          irregular                                  +---------+--------+-------+--------+------------------------+-----------------+ ICA Mid  41      19                                                       +---------+--------+-------+--------+------------------------+-----------------+ ICA      58      22                                                       Distal                                                                    +---------+--------+-------+--------+------------------------+-----------------+  ECA      30      6                                                        +---------+--------+-------+--------+------------------------+-----------------+ +----------+--------+--------+----------------+-------------------+           PSV cm/sEDV cm/sDescribe        Arm Pressure (mmHG) +----------+--------+--------+----------------+-------------------+ [id number]              Multiphasic, WNL                    +----------+--------+--------+----------------+-------------------+ +---------+--------+--+--------+--+---------+ VertebralPSV cm/s38EDV cm/s10Antegrade +---------+--------+--+--------+--+---------+   Summary: Right Carotid: Velocities in the right ICA are consistent with a 1-39% stenosis. Left Carotid: Velocities in the left ICA are consistent with a 1-39% stenosis. Vertebrals:  Bilateral vertebral arteries demonstrate antegrade flow. Subclavians: Normal flow hemodynamics were seen in bilateral subclavian              arteries. *See table(s) above for measurements and observations.  Electronically signed by Delia Heady MD on 08/25/2022 at 1:17:16 PM.    Final    ECHOCARDIOGRAM COMPLETE  Result Date: 08/25/2022    ECHOCARDIOGRAM REPORT   Patient Name:   Antonio Adams Date of Exam: 08/25/2022 Medical Rec #:  [id number]           Height:       75.0 in Accession #:    [id number]          Weight:       191.8 lb Date of Birth:  1946-04-04          BSA:          2.156 m Patient Age:    76 years             BP:           180/101 mmHg Patient Gender: M                   HR:           62 bpm. Exam Location:  Inpatient Procedure: 2D Echo, 3D Echo, Color Doppler and Cardiac Doppler Indications:    Stroke I63.9  History:        Patient has prior history of Echocardiogram examinations, most                 recent 04/08/2022. Stroke; Risk Factors:Diabetes, Dyslipidemia,                 Non-Smoker and Hypertension.  Sonographer:    Aron Baba Referring Phys: [id number] MIR M [facility]  Sonographer Comments: Suboptimal subcostal window. Image acquisition challenging due to respiratory motion. IMPRESSIONS  1. Left ventricular ejection fraction, by estimation, is 50 to 55%. The left ventricle has low normal function. The left ventricle has no regional wall motion abnormalities. Left ventricular diastolic parameters are consistent with Grade I diastolic dysfunction (impaired relaxation).  2. Right ventricular systolic function is normal. The right ventricular size is normal. Tricuspid regurgitation signal is inadequate for assessing PA pressure.  3. Left atrial size was mild to moderately dilated.  4. No evidence of mitral valve regurgitation.  5. The aortic valve was not well visualized. Aortic valve regurgitation is not visualized.  6. There is mild dilatation of the aortic root, measuring 40 mm.  7. The inferior vena cava is dilated in size with >50% respiratory variability, suggesting right atrial pressure of 8 mmHg. FINDINGS  Left Ventricle: Left ventricular ejection fraction, by estimation, is 50 to 55%. The left ventricle has low normal function. The left ventricle has no regional wall motion abnormalities. The left ventricular internal cavity size was normal in size. There is no left ventricular hypertrophy. Left ventricular diastolic parameters are consistent with Grade I diastolic dysfunction (impaired relaxation). Right Ventricle: The right ventricular size is normal. Right ventricular systolic function is normal.  Tricuspid regurgitation signal is inadequate for assessing PA pressure. Left Atrium: Left atrial size was mild to moderately dilated. Right Atrium: Right atrial size was normal in size. Pericardium: There is no evidence of pericardial effusion. Mitral Valve: No evidence of mitral valve regurgitation. Tricuspid Valve: Tricuspid valve regurgitation is not demonstrated. Aortic Valve: The aortic valve was not well visualized. Aortic valve regurgitation is not visualized. Aortic valve mean gradient measures 11.0 mmHg. Aortic valve peak gradient measures 18.9 mmHg. Aortic valve area, by VTI measures 1.53 cm. Pulmonic Valve: Pulmonic valve regurgitation is not visualized. Aorta: There is mild dilatation of the aortic root, measuring 40 mm. Venous: The inferior vena cava is dilated in size with greater than 50% respiratory variability, suggesting right atrial pressure of 8 mmHg.  LEFT VENTRICLE PLAX 2D LVIDd:         5.60 cm   Diastology LVIDs:         4.00 cm   LV e' medial:    4.82 cm/s LV PW:         1.20 cm   LV E/e' medial:  15.6 LV IVS:        0.70 cm   LV e' lateral:   5.70 cm/s LVOT diam:     2.40 cm   LV E/e' lateral: 13.2 LV SV:         76 LV SV Index:   35 LVOT Area:     4.52 cm                           3D Volume EF:                          3D EF:        46 %                          LV EDV:       198 ml                          LV ESV:       107 ml                          LV SV:        91 ml RIGHT VENTRICLE RV S prime:     6.96 cm/s TAPSE (M-mode): 1.8 cm LEFT ATRIUM             Index        RIGHT ATRIUM           Index LA diam:        4.00 cm 1.86 cm/m   RA Area:     24.70 cm LA Vol (A2C):   67.8 ml 31.45 ml/m  RA Volume:  85.60 ml  39.71 ml/m LA Vol (A4C):   90.2 ml 41.84 ml/m LA Biplane Vol: 78.0 ml 36.18 ml/m  AORTIC VALVE AV Area (Vmax):    1.49 cm AV Area (Vmean):   1.41 cm AV Area (VTI):     1.53 cm AV Vmax:           217.50 cm/s AV Vmean:          155.000 cm/s AV VTI:            0.496 m AV  Peak Grad:      18.9 mmHg AV Mean Grad:      11.0 mmHg LVOT Vmax:         71.80 cm/s LVOT Vmean:        48.300 cm/s LVOT VTI:          0.168 m LVOT/AV VTI ratio: 0.34  AORTA Ao Root diam: 4.00 cm Ao Asc diam:  3.80 cm MITRAL VALVE MV Area (PHT): 3.31 cm    SHUNTS MV Decel Time: 229 msec    Systemic VTI:  0.17 m MV E velocity: 75.20 cm/s  Systemic Diam: 2.40 cm MV A velocity: 77.10 cm/s MV E/A ratio:  0.98 [place] signed by Carolan Clines Signature Date/Time: 08/25/2022/11:23:51 AM    Final    MR BRAIN WO CONTRAST  Result Date: 08/24/2022 CLINICAL DATA:  Neuro deficit, acute, stroke suspected EXAM: MRI HEAD WITHOUT CONTRAST TECHNIQUE: Multiplanar, multiecho pulse sequences of the brain and surrounding structures were obtained without intravenous contrast. COMPARISON:  Same day CT head. FINDINGS: Brain: Acute left parieto-occipital periventricular infarcts in the region of prior infarct. No evidence of acute hemorrhage, mass lesion, midline shift or hydrocephalus. Cerebral atrophy. Scattered T2/FLAIR hyperintensities the white matter, compatible with chronic microvascular ischemic change. Vascular: Major arterial flow voids are maintained at the skull base. Skull and upper cervical spine: Normal marrow signal. Sinuses/Orbits: Clear sinuses.  No acute findings. Other: No mastoid effusions. IMPRESSION: Acute left parieto-occipital periventricular infarcts in the region of prior infarct. Electronically Signed   By: Feliberto Harts M.D.   On: 08/24/2022 11:35   CT Head Wo Contrast  Result Date: 08/24/2022 CLINICAL DATA:  Mental status change, unknown cause. EXAM: CT HEAD WITHOUT CONTRAST TECHNIQUE: Contiguous axial images were obtained from the base of the skull through the vertex without intravenous contrast. RADIATION DOSE REDUCTION: This exam was performed according to the departmental dose-optimization program which includes automated exposure control, adjustment of the mA and/or kV according  to patient size and/or use of iterative reconstruction technique. COMPARISON:  Head CT 04/07/2022 and MRI 04/08/2022 FINDINGS: Brain: A moderate-sized chronic left PCA infarct is present involving the left occipital lobe and thalamus (acute to subacute on the prior studies). A lacunar infarct in the right thalamus is new and of indeterminate acuity. No acute cortically based infarct intracranial hemorrhage, mass, midline shift, or extra-axial fluid collection is identified. There is a background of mild chronic small vessel ischemia in the cerebral white matter. There is mild-to-moderate cerebral atrophy. Vascular: Calcified atherosclerosis at the skull base. No hyperdense vessel. Skull: No fracture or suspicious osseous lesion. Sinuses/Orbits: Visualized paranasal sinuses and mastoid air cells are clear. Unremarkable orbits. Other: None. IMPRESSION: 1. Age-indeterminate right thalamic lacunar infarct. 2. Chronic left PCA infarct. Electronically Signed   By: Sebastian Ache M.D.   On: 08/24/2022 09:07   DG Chest 2 View  Result Date: 08/24/2022 CLINICAL DATA:  Altered level of consciousness. EXAM: CHEST - 2 VIEW COMPARISON:  One-view chest x-ray 04/07/2022 FINDINGS: The heart is mildly enlarged. Mild pulmonary vascular congestion is present. No frank edema or focal airspace disease is present. The visualized soft tissues and bony thorax are unremarkable. IMPRESSION: Mild cardiomegaly and pulmonary vascular congestion without frank edema. Electronically Signed   By: Marin Roberts M.D.   On: 08/24/2022 08:33       The results of significant diagnostics from this hospitalization (including imaging, microbiology, ancillary and laboratory) are listed below for reference.     Microbiology: No results found for this or any previous visit (from the past 240 hour(s)).   Labs:  CBC: Recent Labs  Lab 08/21/22 0915 08/24/22 0852 08/25/22 0928  WBC  --  3.7* 3.4*  NEUTROABS  --  2.4  --   HGB 9.7*  10.4* 10.6*  HCT  --  32.2* 32.3*  MCV  --  95.3 94.4  PLT  --  225 216   BMP &GFR Recent Labs  Lab 08/24/22 0852 08/25/22 0928  NA 135 139  K 4.9 5.1  CL 107 110  CO2 19* 18*  GLUCOSE 99 76  BUN 74* 67*  CREATININE 9.48* 9.18*  CALCIUM 9.0 9.0   Estimated Creatinine Clearance: 8.2 mL/min (A) (by C-G formula based on SCr of 9.18 mg/dL (H)). Liver & Pancreas: Recent Labs  Lab 08/24/22 0852  AST 19  ALT 19  ALKPHOS 94  BILITOT 0.4  PROT 6.7  ALBUMIN 3.4*   No results for input(s): "LIPASE", "AMYLASE" in the last 168 hours. Recent Labs  Lab 08/24/22 0852  AMMONIA 18   Diabetic: Recent Labs    08/25/22 0647  HGBA1C 5.4   Recent Labs  Lab 08/24/22 0858 08/24/22 1644  GLUCAP 84 85   Cardiac Enzymes: No results for input(s): "CKTOTAL", "CKMB", "CKMBINDEX", "TROPONINI" in the last 168 hours. No results for input(s): "PROBNP" in the last 8760 hours. Coagulation Profile: No results for input(s): "INR", "PROTIME" in the last 168 hours. Thyroid Function Tests: No results for input(s): "TSH", "T4TOTAL", "FREET4", "T3FREE", "THYROIDAB" in the last 72 hours. Lipid Profile: Recent Labs    08/25/22 0647  CHOL 227*  HDL 47  LDLCALC 164*  TRIG 79  CHOLHDL 4.8   Anemia Panel: No results for input(s): "VITAMINB12", "FOLATE", "FERRITIN", "TIBC", "IRON", "RETICCTPCT" in the last 72 hours. Urine analysis:    Component Value Date/Time   COLORURINE STRAW (A) 08/24/2022 0922   APPEARANCEUR CLEAR 08/24/2022 0922   LABSPEC 1.010 08/24/2022 0922   PHURINE 7.0 08/24/2022 0922   GLUCOSEU 150 (A) 08/24/2022 0922   GLUCOSEU 250 (A) 05/20/2020 1606   HGBUR NEGATIVE 08/24/2022 0922   BILIRUBINUR NEGATIVE 08/24/2022 0922   BILIRUBINUR neg 03/21/2012 1127   KETONESUR NEGATIVE 08/24/2022 0922   PROTEINUR 100 (A) 08/24/2022 0922   UROBILINOGEN 0.2 05/20/2020 1606   NITRITE NEGATIVE 08/24/2022 0922   LEUKOCYTESUR NEGATIVE 08/24/2022 0922   Sepsis Labs: Invalid input(s):  "PROCALCITONIN", "LACTICIDVEN"   SIGNED:  Almon Hercules, MD  [facility] 08/25/2022, 4:05 PM

## 2022-08-25 NOTE — Plan of Care (Signed)
  Problem: Ischemic Stroke/TIA Tissue Perfusion: Goal: Complications of ischemic stroke/TIA will be minimized Outcome: Progressing   Problem: Nutrition: Goal: Risk of aspiration will decrease Outcome: Progressing   Problem: Nutrition: Goal: Dietary intake will improve Outcome: Progressing

## 2022-08-25 NOTE — TOC Initial Note (Signed)
Transition of Care [facility]) - Initial/Assessment Note    Patient Details  Name: Antonio Adams MRN: [id number] Date of Birth: [birth year]  [facility] CM/SW Contact:    Kermit Balo, RN Phone Number: 08/25/2022, 2:36 PM  Clinical Narrative:                 Pt is from home alone but his significant other can be there to provide supervision. SO drives him to appts, etc.  Pt manages his own medications and denies any issues. [facility] [address] Pt prefers to attend outpt therapy [address]. Information on the AVS.  Pt recommended a shower seat. He prefers to pick one up outside the hospital as his insurance doesn't cover the cost.  SO will transport home when medically ready.  Expected Discharge Plan: OP Rehab Barriers to Discharge: Continued Medical Work up   Patient Goals and CMS Choice     Choice offered to / list presented to : Patient      Expected Discharge Plan and Services   Discharge Planning Services: CM Consult   Living arrangements for the past 2 months: Single Family Home                                      Prior Living Arrangements/Services Living arrangements for the past 2 months: Single Family Home Lives with:: Self Patient language and need for interpreter reviewed:: Yes Do you feel safe going back to the place where you live?: Yes        Care giver support system in place?: Yes (comment) Current home services: DME (cane) Criminal Activity/Legal Involvement Pertinent to Current Situation/Hospitalization: No - Comment as needed  Activities of Daily Living      Permission Sought/Granted                  Emotional Assessment Appearance:: Appears stated age Attitude/Demeanor/Rapport: Engaged Affect (typically observed): Accepting Orientation: : Oriented to Self, Oriented to Place, Oriented to  Time, Oriented to Situation   Psych Involvement: No (comment)  Admission diagnosis:  CKD (chronic  kidney disease) stage 5, GFR less than 15 ml/min [N18.5] Acute ischemic stroke [I63.9] Cerebrovascular accident (CVA), unspecified mechanism [I63.9] Patient Active Problem List   Diagnosis Date Noted   Acute ischemic stroke 08/24/2022   Vaccine for VZV (varicella-zoster virus) 06/12/2022   Hx of completed stroke 04/28/2022   Abnormal brain CT 04/07/2022   Encounter for general adult medical examination with abnormal findings 01/23/2022   Anemia due to zinc deficiency 12/20/2021   Low back pain 12/15/2021   Symptomatic anemia 12/15/2021   Cancer related pain 12/15/2021   Chronic heart failure with preserved ejection fraction (HFpEF) 08/12/2021   Tinea cruris 08/12/2021   Anemia due to chronic kidney disease 07/20/2021   CKD (chronic kidney disease) stage 5, GFR less than 15 ml/min 05/24/2021   BPH (benign prostatic hyperplasia) 05/24/2021   Diabetic renal disease 05/20/2021   Dry eye syndrome of bilateral lacrimal glands 05/20/2021   Senile nuclear sclerosis 05/20/2021   Vitamin D deficiency 05/20/2021   Vitreous degeneration, unspecified eye 05/20/2021   Intrinsic eczema 04/18/2021   Atherosclerosis of aorta 12/30/2020   Mixed conductive and sensorineural hearing loss of left ear with restricted hearing of right ear 09/01/2020   Tinnitus of both ears 05/20/2020   Metastatic renal cell carcinoma 02/26/2020   Diabetic polyneuropathy associated with  type 2 diabetes mellitus 12/18/2019   Murmur, cardiac 10/08/2018   DDD (degenerative disc disease), cervical 10/08/2018   OAB (overactive bladder) 04/04/2018   Seasonal allergic rhinitis due to pollen 11/01/2016   Microalbuminuria due to type 2 diabetes mellitus 07/27/2016   Varicose veins of both lower extremities 08/06/2015   Varicose veins of lower extremities with ulcer 06/16/2015   Internal hemorrhoid, bleeding 08/03/2014   Post traumatic stress disorder (PTSD) 01/29/2014   Spinal stenosis of lumbar region with radiculopathy  10/30/2011    Class: Chronic   Obstructive sleep apnea 11/24/2010   Obesity 11/24/2010   Routine general medical examination at a health care facility 11/24/2010   ADENOCARCINOMA, PROSTATE, GLEASON GRADE 6 12/21/2009   Essential hypertension 03/16/2007   GERD 03/16/2007   PCP:  Etta Grandchild, MD [phone] [address], [place] - [address] [place] [address] Phone: [phone] Fax: [phone]     Social Determinants of Health (SDOH) Social History: SDOH Screenings   Food Insecurity: No Food Insecurity (04/08/2022)  Housing: Low Risk  (04/08/2022)  Transportation Needs: No Transportation Needs (04/08/2022)  Utilities: Not At Risk (04/08/2022)  Alcohol Screen: Low Risk  (01/06/2022)  Depression (PHQ2-9): Medium Risk (05/29/2022)  Financial Resource Strain: Low Risk  (01/06/2022)  Physical Activity: Inactive (01/06/2022)  Social Connections: Moderately Integrated (01/06/2022)  Stress: No Stress Concern Present (01/06/2022)  Tobacco Use: Low Risk  (08/24/2022)   SDOH Interventions:     Readmission Risk Interventions     No data to display

## 2022-08-25 NOTE — Evaluation (Signed)
Speech Language Pathology Evaluation Patient Details Name: Antonio Adams MRN: [id number] DOB: Jul 26, 1945 Today's Date: 08/25/2022 Time: 4481-8563 SLP Time Calculation (min) (ACUTE ONLY): 27 min  Problem List:  Patient Active Problem List   Diagnosis Date Noted   Acute ischemic stroke 08/24/2022   Vaccine for VZV (varicella-zoster virus) 06/12/2022   Hx of completed stroke 04/28/2022   Abnormal brain CT 04/07/2022   Encounter for general adult medical examination with abnormal findings 01/23/2022   Anemia due to zinc deficiency 12/20/2021   Low back pain 12/15/2021   Symptomatic anemia 12/15/2021   Cancer related pain 12/15/2021   Chronic heart failure with preserved ejection fraction (HFpEF) 08/12/2021   Tinea cruris 08/12/2021   Anemia due to chronic kidney disease 07/20/2021   CKD (chronic kidney disease) stage 5, GFR less than 15 ml/min 05/24/2021   BPH (benign prostatic hyperplasia) 05/24/2021   Diabetic renal disease 05/20/2021   Dry eye syndrome of bilateral lacrimal glands 05/20/2021   Senile nuclear sclerosis 05/20/2021   Vitamin D deficiency 05/20/2021   Vitreous degeneration, unspecified eye 05/20/2021   Intrinsic eczema 04/18/2021   Atherosclerosis of aorta 12/30/2020   Mixed conductive and sensorineural hearing loss of left ear with restricted hearing of right ear 09/01/2020   Tinnitus of both ears 05/20/2020   Metastatic renal cell carcinoma 02/26/2020   Diabetic polyneuropathy associated with type 2 diabetes mellitus 12/18/2019   Murmur, cardiac 10/08/2018   DDD (degenerative disc disease), cervical 10/08/2018   OAB (overactive bladder) 04/04/2018   Seasonal allergic rhinitis due to pollen 11/01/2016   Microalbuminuria due to type 2 diabetes mellitus 07/27/2016   Varicose veins of both lower extremities 08/06/2015   Varicose veins of lower extremities with ulcer 06/16/2015   Internal hemorrhoid, bleeding 08/03/2014   Post traumatic stress disorder (PTSD)  01/29/2014   Spinal stenosis of lumbar region with radiculopathy 10/30/2011    Class: Chronic   Obstructive sleep apnea 11/24/2010   Obesity 11/24/2010   Routine general medical examination at a health care facility 11/24/2010   ADENOCARCINOMA, PROSTATE, GLEASON GRADE 6 12/21/2009   Essential hypertension 03/16/2007   GERD 03/16/2007   Past Medical History:  Past Medical History:  Diagnosis Date   ADENOCARCINOMA, PROSTATE, GLEASON GRADE 6 12/21/2009   prostate cancer   ALLERGIC RHINITIS 03/16/2007   Allergy    Anxiety    Arthritis    back    BACK PAIN WITH RADICULOPATHY 11/23/2008   Cancer of kidney    partial right kidney removed   CARPAL TUNNEL SYNDROME, BILATERAL 03/16/2007   Chronic back pain    Constipation    takes Senokot daily   Depression    occasionally   DIABETES MELLITUS, TYPE II 03/16/2007   takes Januvia and MEtformin daily   GASTROINTESTINAL HEMORRHAGE, HX OF 03/16/2007   GERD 03/16/2007   takes Omeprazole daily   Glaucoma    mild - no eye drops   Hemorrhoids    History of colon polyps    HYPERLIPIDEMIA 03/16/2007   takes Crestor daily   HYPERTENSION 03/16/2007   takes Diltiazem and Lisinopril daily    Kidney cancer, primary, with metastasis from kidney to other site    LEG CRAMPS 05/22/2007   Nocturia    Overactive bladder    PARESTHESIA 05/21/2007   Proctitis    PTSD (post-traumatic stress disorder)    wakes up may be dreaming of fighting   Rectal bleeding    Dr.Norins has explained its from the Radiation that he has received  Rectal bleeding    Renal cell carcinoma    Sleep apnea    uses CPAP nightly   Stomach cancer    Urinary frequency    takes Toviaz daily   Past Surgical History:  Past Surgical History:  Procedure Laterality Date   ADRENALECTOMY Right 02/26/2020   Procedure: OPEN RIGHT RENAL CELL METASTATECTOMY AND  ADRENALECTOMY;  Surgeon: Crist Fat, MD;  Location: [facility];  Service: Urology;  Laterality: Right;   BACK SURGERY  1983,  2008   repeat surgery; ESI '08   CARPAL TUNNEL RELEASE     bilateral   CERVICAL FUSION     COLONOSCOPY     colonosocpy     ESOPHAGOGASTRODUODENOSCOPY     ED   FLEXIBLE SIGMOIDOSCOPY N/A 01/08/2017   Procedure: FLEXIBLE SIGMOIDOSCOPY;  Surgeon: Hilarie Fredrickson, MD;  Location: [facility]COPY;  Service: Endoscopy;  Laterality: N/A;   HOT HEMOSTASIS N/A 01/08/2017   Procedure: HOT HEMOSTASIS (ARGON PLASMA COAGULATION/BICAP);  Surgeon: Hilarie Fredrickson, MD;  Location: [facility] ENDOSCOPY;  Service: Endoscopy;  Laterality: N/A;   IR RADIOLOGIST EVAL & MGMT  12/17/2019   IR RADIOLOGIST EVAL & MGMT  07/01/2020   IR RADIOLOGIST EVAL & MGMT  08/25/2020   LUMBAR LAMINECTOMY  10/30/2011   Procedure: MICRODISCECTOMY LUMBAR LAMINECTOMY;  Surgeon: Kerrin Champagne, MD;  Location: [facility] OR;  Service: Orthopedics;  Laterality: Right;  Right L4-5 and L5-S1 Microdiscectomy   LUMBAR LAMINECTOMY  06/17/2012   Procedure: MICRODISCECTOMY LUMBAR LAMINECTOMY;  Surgeon: Kerrin Champagne, MD;  Location: [facility] OR;  Service: Orthopedics;  Laterality: N/A;  Right L5-S1 microdiscectomy   OPEN PARTIAL HEPATECTOMY  N/A 02/26/2020   Procedure: OPEN PARTIAL HEPATECTOMY, CHOLECYSTECTOMY;  Surgeon: Almond Lint, MD;  Location: [facility];  Service: General;  Laterality: N/A;   POLYPECTOMY     PROSTATE SURGERY  march 2012   seed implant   RADIOFREQUENCY ABLATION N/A 07/21/2020   Procedure: CT MICROWAVE ABLATION;  Surgeon: Sterling Big, MD;  Location: [facility];  Service: Anesthesiology;  Laterality: N/A;   ROBOTIC ASSITED PARTIAL NEPHRECTOMY Right 09/08/2015   Procedure: XI ROBOTIC ASSITED RIGHT PARTIAL NEPHRECTOMY;  Surgeon: Crist Fat, MD;  Location: [facility];  Service: Urology;  Laterality: Right;  Clamp on: 1033 Clamp off: 1103 Total Clamp Time: 30 minutes   SHOULDER ARTHROSCOPY WITH SUBACROMIAL DECOMPRESSION Left 01/20/2016   Procedure: LEFT SHOULDER ARTHROSCOPY WITH EXTENSIVE  DEBRIDEMENT, ACROMIOPLASTY;  Surgeon: Loreta Ave, MD;  Location:  [facility];  Service: Orthopedics;  Laterality: Left;   SHOULDER SURGERY  LFT   Stress Cardiolite  12/24/2003   negative for ischemia   UPPER ESOPHAGEAL ENDOSCOPIC ULTRASOUND (EUS)  04/18/2016   [facility]   UPPER GASTROINTESTINAL ENDOSCOPY     WOUND EXPLORATION     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 male who presents with episode of confusion/hallucinations. MRI (08/24/22) revealed "Acute left parieto-occipital periventricular infarcts in the region of prior infarct". SLE after previous CVA (04/09/22) noted cognitive impairment (memory/problem solving) with pt receiving OP SLP services post discharge. PMH: GERD, prior L PCA stroke, metastatic RCC, prostate cancer, stage V CKD declining dialysis.   Assessment / Plan / Recommendation Clinical Impression  Pt presents with cognitive communication deficits, although unsure if there is an exacerbation from suspected baseline deficits from prior CVA. Last OP SLP note (06/15/22) reported deficits in STM/problem solving/orientation. No family present to provide information regarding  baseline.This am, he exhibited difficulty with orientation to time (month, year) and reason for hospitalization. He reports that this is different from normal and that he is typically able to recall this information. Immediate recall (4/5) and delayed recall (0/5) of listed items was also poor, but he did benefit from repetition during immediate recall task and semantic cues for delayed recall of items. Simple problem solving for every day tasks appeared [facility], with poor judgement/reasoning noted when given complex problem solving scenarios involving completion of IADLs or management of health conditions at home. Complex 2-step commands followed with 50% accuracy and suspect this is more related to working memory deficits vs auditory comprehension impairment. Pt would benefit from SLP services acutely and post discharge in  order to treat for cognitive functions and promote maximal independence with daily tasks.    SLP Assessment  SLP Recommendation/Assessment: Patient needs continued Speech Lanaguage Pathology Services SLP Visit Diagnosis: Cognitive communication deficit (R41.841)    Recommendations for follow up therapy are one component of a multi-disciplinary discharge planning process, led by the attending physician.  Recommendations may be updated based on patient status, additional functional criteria and insurance authorization.    Follow Up Recommendations  Follow physician's recommendations for discharge plan and follow up therapies    Assistance Recommended at Discharge  Frequent or constant Supervision/Assistance  Functional Status Assessment Patient has had a recent decline in their functional status and demonstrates the ability to make significant improvements in function in a reasonable and predictable amount of time.  Frequency and Duration min 2x/week  2 weeks      SLP Evaluation Cognition  Overall Cognitive Status: History of cognitive impairments - at baseline (no family present to determine any changes from baseline, however pt reports change in [facility]) Arousal/Alertness: Awake/alert Orientation Level: Oriented to person;Oriented to place;Disoriented to time;Disoriented to situation Year:  (unable to recall) Month:  (unable to recall) Day of Week: Correct Attention: Sustained Sustained Attention: Appears intact Memory: Impaired Memory Impairment: Storage deficit;Retrieval deficit;Decreased recall of new information;Decreased short term memory Decreased Short Term Memory: Verbal basic Awareness: Impaired Awareness Impairment: Intellectual impairment Problem Solving: Impaired Problem Solving Impairment: Verbal complex Executive Function: Reasoning;Decision Making Reasoning: Impaired Reasoning Impairment: Verbal complex Decision Making: Impaired Decision Making Impairment: Verbal  complex Safety/Judgment: Impaired       Comprehension  Auditory Comprehension Overall Auditory Comprehension: Appears within functional limits for tasks assessed Visual Recognition/Discrimination Discrimination: Not tested Reading Comprehension Reading Status: Not tested    Expression Expression Primary Mode of Expression: Verbal Verbal Expression Overall Verbal Expression: Appears within functional limits for tasks assessed Initiation: No impairment Automatic Speech: Name;Social Response Level of Generative/Spontaneous Verbalization: Conversation Naming: No impairment Written Expression Dominant Hand: Right Written Expression: Not tested   Oral / Motor  Oral Motor/Sensory Function Overall Oral Motor/Sensory Function: Within functional limits Motor Speech Overall Motor Speech: Appears within functional limits for tasks assessed              Avie Echevaria, MA, CCC-SLP Acute Rehabilitation Services Office Number: [phone]  Paulette Blanch 08/25/2022, 10:26 AM

## 2022-08-25 NOTE — Progress Notes (Signed)
  Echocardiogram 2D Echocardiogram has been performed.  Antonio Adams 08/25/2022, 11:07 AM

## 2022-08-27 ENCOUNTER — Other Ambulatory Visit: Payer: Self-pay | Admitting: Internal Medicine

## 2022-08-27 DIAGNOSIS — L2084 Intrinsic (allergic) eczema: Secondary | ICD-10-CM

## 2022-08-27 DIAGNOSIS — T7840XA Allergy, unspecified, initial encounter: Secondary | ICD-10-CM

## 2022-08-28 ENCOUNTER — Telehealth: Payer: Self-pay

## 2022-08-28 NOTE — Patient Outreach (Signed)
  Emmi Stroke Care Coordination Follow Up  08/28/2022 Name:  Antonio Adams MRN:  [id number] DOB:  [birth year]  Subjective: Antonio Adams is a 77 y.o. year old male who is a primary care patient of Etta Grandchild, MD   An Emmi alert was received indicating patient responded to questions: Scheduled a follow-up appointment?. I reached out by phone to follow up on the alert and spoke to  significant other-Antonio Adams . She voices patient is doing fairly well since returning home. He is still asleep at present. Reviewed and addressed red alert. She voices that she will call PCP and neuro office today to make follow up appts. She will be able to take patient to appts. She states that rehab called this morning and patient will go for first session on tomorrow. She confirms pt has all his meds and no issues or concerns regarding them.  Care Coordination Interventions:  Yes, provided   TOC Interventions Today    Flowsheet Row Most Recent Value  TOC Interventions   TOC Interventions Discussed/Reviewed TOC Interventions Discussed      Interventions Today    Flowsheet Row Most Recent Value  Chronic Disease   Chronic disease during today's visit Other  [stroke]  General Interventions   General Interventions Discussed/Reviewed Doctor Visits  Doctor Visits Discussed/Reviewed Doctor Visits Discussed, PCP, Specialist  PCP/Specialist Visits Compliance with follow-up visit  Education Interventions   Education Provided Provided Education  Provided Verbal Education On Nutrition, When to see the doctor, Medication, Other  Nutrition Interventions   Nutrition Discussed/Reviewed Nutrition Discussed, Decreasing salt  Pharmacy Interventions   Pharmacy Dicussed/Reviewed Pharmacy Topics Discussed, Medications and their functions  Safety Interventions   Safety Discussed/Reviewed Safety Discussed        Follow up plan: Advised caregiver that they would continue to get automated EMMI-Stroke post  discharge calls to assess how they are doing following recent hospitalization and will receive a call from a nurse if any of their responses were abnormal. Caregiver voiced understanding and was appreciative of f/u call.   Encounter Outcome:  Pt. Visit Completed   Alessandra Grout [facility] Care Management Care Management Community Coordinator Direct Phone: [phone] Toll Free: [phone] Fax: [phone]

## 2022-08-28 NOTE — Patient Outreach (Signed)
Received a red flag Emmi stroke notification for [name]. I have assigned Antionette Fairy, RN to call for follow up and determine if there are any Case Management needs.    Iverson Alamin, Donivan Scull [facility] Care Management Assistant [facility] Care Management [phone]

## 2022-08-29 ENCOUNTER — Ambulatory Visit: Payer: Medicare Other | Attending: Internal Medicine | Admitting: Physical Therapy

## 2022-08-29 ENCOUNTER — Encounter: Payer: Self-pay | Admitting: Physical Therapy

## 2022-08-29 VITALS — BP 146/77 | HR 67

## 2022-08-29 DIAGNOSIS — M6281 Muscle weakness (generalized): Secondary | ICD-10-CM | POA: Diagnosis not present

## 2022-08-29 DIAGNOSIS — R2689 Other abnormalities of gait and mobility: Secondary | ICD-10-CM | POA: Insufficient documentation

## 2022-08-29 DIAGNOSIS — R2681 Unsteadiness on feet: Secondary | ICD-10-CM | POA: Diagnosis not present

## 2022-08-29 DIAGNOSIS — I639 Cerebral infarction, unspecified: Secondary | ICD-10-CM | POA: Diagnosis not present

## 2022-08-29 NOTE — Therapy (Signed)
OUTPATIENT PHYSICAL THERAPY NEURO EVALUATION   Patient Name: Antonio Adams MRN: [id number] DOB:1946/01/30, 77 y.o., male Today's Date: 08/29/2022   PCP: Sanda Linger, MD REFERRING PROVIDER: Almon Hercules, MD  END OF SESSION:  PT End of Session - 08/29/22 1105     Visit Number 1    Number of Visits 17   including eval   Date for PT Re-Evaluation 11/07/22    Authorization Type [employer]    Progress Note Due on Visit 10    PT Start Time 1102    PT Stop Time 1145    PT Time Calculation (min) 43 min    Equipment Utilized During Treatment Gait belt    Activity Tolerance Patient tolerated treatment well    Behavior During Therapy WFL for tasks assessed/performed             Past Medical History:  Diagnosis Date   ADENOCARCINOMA, PROSTATE, GLEASON GRADE 6 12/21/2009   prostate cancer   ALLERGIC RHINITIS 03/16/2007   Allergy    Anxiety    Arthritis    back    BACK PAIN WITH RADICULOPATHY 11/23/2008   Cancer of kidney    partial right kidney removed   CARPAL TUNNEL SYNDROME, BILATERAL 03/16/2007   Chronic back pain    Constipation    takes Senokot daily   Depression    occasionally   DIABETES MELLITUS, TYPE II 03/16/2007   takes Januvia and MEtformin daily   GASTROINTESTINAL HEMORRHAGE, HX OF 03/16/2007   GERD 03/16/2007   takes Omeprazole daily   Glaucoma    mild - no eye drops   Hemorrhoids    History of colon polyps    HYPERLIPIDEMIA 03/16/2007   takes Crestor daily   HYPERTENSION 03/16/2007   takes Diltiazem and Lisinopril daily    Kidney cancer, primary, with metastasis from kidney to other site    LEG CRAMPS 05/22/2007   Nocturia    Overactive bladder    PARESTHESIA 05/21/2007   Proctitis    PTSD (post-traumatic stress disorder)    wakes up may be dreaming of fighting   Rectal bleeding    Dr.Norins has explained its from the Radiation that he has received   Rectal bleeding    Renal cell carcinoma    Sleep apnea    uses CPAP nightly   Stomach cancer     Urinary frequency    takes Toviaz daily   Past Surgical History:  Procedure Laterality Date   ADRENALECTOMY Right 02/26/2020   Procedure: OPEN RIGHT RENAL CELL METASTATECTOMY AND  ADRENALECTOMY;  Surgeon: Crist Fat, MD;  Location: [facility];  Service: Urology;  Laterality: Right;   BACK SURGERY  1983, 2008   repeat surgery; ESI '08   CARPAL TUNNEL RELEASE     bilateral   CERVICAL FUSION     COLONOSCOPY     colonosocpy     ESOPHAGOGASTRODUODENOSCOPY     ED   FLEXIBLE SIGMOIDOSCOPY N/A 01/08/2017   Procedure: FLEXIBLE SIGMOIDOSCOPY;  Surgeon: Hilarie Fredrickson, MD;  Location: [facility];  Service: Endoscopy;  Laterality: N/A;   HOT HEMOSTASIS N/A 01/08/2017   Procedure: HOT HEMOSTASIS (ARGON PLASMA COAGULATION/BICAP);  Surgeon: Hilarie Fredrickson, MD;  Location: [facility] END[facility]COPY;  Service: Endoscopy;  Laterality: N/A;   IR RADIOLOGIST EVAL & MGMT  12/17/2019   IR RADIOLOGIST EVAL & MGMT  07/01/2020   IR RADIOLOGIST EVAL & MGMT  08/25/2020   LUMBAR LAMINECTOMY  10/30/2011   Procedure: MICRODISCECTOMY LUMBAR LAMINECTOMY;  Surgeon: Kerrin Champagne, MD;  Location: [facility] OR;  Service: Orthopedics;  Laterality: Right;  Right L4-5 and L5-S1 Microdiscectomy   LUMBAR LAMINECTOMY  06/17/2012   Procedure: MICRODISCECTOMY LUMBAR LAMINECTOMY;  Surgeon: Kerrin Champagne, MD;  Location: [facility] OR;  Service: Orthopedics;  Laterality: N/A;  Right L5-S1 microdiscectomy   OPEN PARTIAL HEPATECTOMY  N/A 02/26/2020   Procedure: OPEN PARTIAL HEPATECTOMY, CHOLECYSTECTOMY;  Surgeon: Almond Lint, MD;  Location: [facility];  Service: General;  Laterality: N/A;   POLYPECTOMY     PROSTATE SURGERY  march 2012   seed implant   RADIOFREQUENCY ABLATION N/A 07/21/2020   Procedure: CT MICROWAVE ABLATION;  Surgeon: Sterling Big, MD;  Location: [facility];  Service: Anesthesiology;  Laterality: N/A;   ROBOTIC ASSITED PARTIAL NEPHRECTOMY Right 09/08/2015   Procedure: XI ROBOTIC ASSITED RIGHT PARTIAL NEPHRECTOMY;  Surgeon: Crist Fat,  MD;  Location: [facility];  Service: Urology;  Laterality: Right;  Clamp on: 1033 Clamp off: 1103 Total Clamp Time: 30 minutes   SHOULDER ARTHROSCOPY WITH SUBACROMIAL DECOMPRESSION Left 01/20/2016   Procedure: LEFT SHOULDER ARTHROSCOPY WITH EXTENSIVE  DEBRIDEMENT, ACROMIOPLASTY;  Surgeon: Loreta Ave, MD;  Location: [facility];  Service: Orthopedics;  Laterality: Left;   SHOULDER SURGERY  LFT   Stress Cardiolite  12/24/2003   negative for ischemia   UPPER ESOPHAGEAL ENDOSCOPIC ULTRASOUND (EUS)  04/18/2016   [facility]   UPPER GASTROINTESTINAL ENDOSCOPY     WOUND EXPLORATION     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   Patient Active Problem List   Diagnosis Date Noted   Acute CVA (cerebrovascular accident) 08/25/2022   Acute ischemic stroke 08/24/2022   Vaccine for VZV (varicella-zoster virus) 06/12/2022   Hx of completed stroke 04/28/2022   Abnormal brain CT 04/07/2022   Encounter for general adult medical examination with abnormal findings 01/23/2022   Anemia due to zinc deficiency 12/20/2021   Low back pain 12/15/2021   Symptomatic anemia 12/15/2021   Cancer related pain 12/15/2021   Chronic heart failure with preserved ejection fraction (HFpEF) 08/12/2021   Tinea cruris 08/12/2021   Anemia due to chronic kidney disease 07/20/2021   CKD (chronic kidney disease) stage 5, GFR less than 15 ml/min 05/24/2021   BPH (benign prostatic hyperplasia) 05/24/2021   Diabetic renal disease 05/20/2021   Dry eye syndrome of bilateral lacrimal glands 05/20/2021   Senile nuclear sclerosis 05/20/2021   Vitamin D deficiency 05/20/2021   Vitreous degeneration, unspecified eye 05/20/2021   Intrinsic eczema 04/18/2021   Atherosclerosis of aorta 12/30/2020   Mixed conductive and sensorineural hearing loss of left ear with restricted hearing of right ear 09/01/2020   Tinnitus of both ears 05/20/2020   Metastatic renal cell carcinoma 02/26/2020   Diabetic  polyneuropathy associated with type 2 diabetes mellitus 12/18/2019   Murmur, cardiac 10/08/2018   DDD (degenerative disc disease), cervical 10/08/2018   OAB (overactive bladder) 04/04/2018   Seasonal allergic rhinitis due to pollen 11/01/2016   Microalbuminuria due to type 2 diabetes mellitus 07/27/2016   Varicose veins of both lower extremities 08/06/2015   Varicose veins of lower extremities with ulcer 06/16/2015   Internal hemorrhoid, bleeding 08/03/2014   Post traumatic stress disorder (PTSD) 01/29/2014   Spinal stenosis of lumbar region with radiculopathy 10/30/2011    Class: Chronic   Obstructive sleep apnea 11/24/2010   Cognitive impairment 11/24/2010   Obesity 11/24/2010   Routine general medical examination at a health care facility 11/24/2010   ADENOCARCINOMA,  PROSTATE, GLEASON GRADE 6 12/21/2009   Essential hypertension 03/16/2007   GERD 03/16/2007    ONSET DATE: 08/25/2022 (referral)   REFERRING DIAG:  I63.9 (ICD-10-CM) - Cerebrovascular accident (CVA), unspecified mechanism    THERAPY DIAG:  Muscle weakness (generalized) - Plan: PT plan of care cert/re-cert  Unsteadiness on feet - Plan: PT plan of care cert/re-cert  Other abnormalities of gait and mobility - Plan: PT plan of care cert/re-cert  Rationale for Evaluation and Treatment: Rehabilitation  SUBJECTIVE:                                                                                                                                                                                             SUBJECTIVE STATEMENT:  Patient reports that he feels functionally similar between the first and second stroke. Patient is frustrating by not being able to drive. Patient arrives to clinic with cane but states he is not using his cane in the house. Patient has not started back on HEP given at last PT discharge since returning from hospital. Patient reports greatest difficulty when trying to fall asleep at night. Fiance  reports ongoing bal  Pt accompanied by: self and significant other - fiance [name]  PERTINENT HISTORY:  Patient was discharged from this setting on 3/7 but returned to the ED on 4/11 due to acute onset of dizziness. Patient found to have an acute left parietal-occipital perventricular infarcts in region of prior infarct.   PMH: ESRD not on HD, renal cancer metastatic to liver, adrenals and lymph nodes no longer on chemo, GERD, L PCA infarct November 2023, DM2, and anemia.  PAIN:  Are you having pain? No  PRECAUTIONS: Fall - R hemibody  WEIGHT BEARING RESTRICTIONS: No  FALLS: Has patient fallen in last 6 months? Yes. Number of falls 1 at time of initial stroke  LIVING ENVIRONMENT: Lives with: lives alone Lives in: House/apartment Stairs: No Has following equipment at home: Single point cane and None  PLOF: Independent - required modified assistance following initial stroke  PATIENT GOALS: "To step over curb without losing my balance."   OBJECTIVE:   DIAGNOSTIC FINDINGS:   MR BRAIN WO IMPRESSION on 08/24/2022: Acute left parieto-occipital periventricular infarcts in the region of prior infarct.  COGNITION: Overall cognitive status: Impaired   SENSATION: WFL  COORDINATION: WFL  EDEMA:  1+ edema in bilateral ankels  MUSCLE TONE: RLE: Mild and Moderate increased tone on RLE  POSTURE: rounded shoulders and forward head  LOWER EXTREMITY ROM:    Increased extensor tone on RLE (mild-moderate), decreased ankle dorsiflexion bilaterally  LOWER EXTREMITY MMT:    Mild decreased strength on  RLE   TRANSFERS: Assistive device utilized: Single point cane  Sit to stand: SBA Stand to sit: SBA Chair to chair: SBA Fwd LOB on one occasion, WBOS, strong reliance on upper extremities to come to stand with poor anterior weight shift  GAIT: Gait pattern: decreased step length- Right, decreased step length- Left, decreased hip/knee flexion- Right, decreased hip/knee flexion- Left,  decreased ankle dorsiflexion- Right, decreased ankle dorsiflexion- Left, trendelenburg, and wide BOS Distance walked: 2 x 80 feet Assistive device utilized: Single point cane - intermixed use of of carrying versus placing on ground Level of assistance: SBA Comments: Poor awareness of visual surroundings, poor navigation around obstacles in gym, slow, unsteady with transitional movements and turns  FUNCTIONAL TESTS:  5 times sit to stand: 37.34" with mix of thigh assist and UE support, one forward LOB Timed up and go (TUG): 15.23" without SPC with increased signs of instability, 19.58" with SPC (SBA)   10 meter walk test: 16.54" or 0.60 m/s or 1.97 ft/sec with SPC (SBA)   PATIENT SURVEYS:  FOTO 56  TODAY'S TREATMENT:                                                                                                                                Initial Eval only  PATIENT EDUCATION: Education details: POC, examination findings compared to last PT D/C, collaboration on goals, continue HEP last provided Person educated: Patient and Caregiver fiance Education method: Explanation Education comprehension: verbalized understanding and needs further education  HOME EXERCISE PROGRAM:  HEP provided at D/C at last PT visit; recommend to continue at this time:   Access Code: [email or web address] URL: [email or web address] Date: 07/18/2022 Prepared by: Camille Bal   Exercises - Corner Balance Feet Together With Eyes Closed  - 1 x daily - 5 x weekly - 1 sets - 2-3 reps - 30-45 seconds hold - Supine Bridge  - 1 x daily - 4-5 x weekly - 2 sets - 10 reps - Sit to Stand with Arms Crossed  - 1 x daily - 5 x weekly - 1-2 sets - 8 reps - Lower Trunk Rotations  - 1 x daily - 4 x weekly - 2 sets - 15-20 reps - Seated Hamstring Stretch  - 1 x daily - 5 x weekly - 1 sets - 2-3 reps - 45 seconds hold     GOALS: Goals reviewed with patient? Yes  SHORT TERM GOALS: Target date:  09/26/2022  Patient will demonstrate 100% compliance with initial HEP to continue to progress between physical therapy sessions.   Baseline: Continue with HEP provided at last PT D/C (still appropriate) Goal status: INITIAL  2.  Patient will improve 5x Sit to Stand score to 32" or less with minimal amount of UE use necessary to demonstrate a minimal clinically important difference towards a decreased  risk for falls and improved lower extremity strength.   Baseline: 37.34" with mix of thigh assist and UE support,  one forward LOB  Goal status: INITIAL  3.  Patient will improve TUG score to 15" or less with LRAD and no signs of imbalance to indicate a decreased risk of falls and demonstrate improved overall mobility.   Baseline: 15.23" without SPC with increased signs of instability, 19.58" with SPC (SBA)   Goal status: INITIAL  4.  Patient will improve gait speed to 0.7 m/s or greater with LRAD to indicate progress towards a decreased risk for falls.   Baseline: 0.60 m/s or 1.97 ft/sec with SPC (SBA) Goal status: INITIAL  5.  Berg balance test to be assessed/goal written as indicated Baseline: To be assessed Goal status: INITIAL  6.  Curb to be assessed/goal written as indicated Baseline: To be assessed Goal status: INITIAL  LONG TERM GOALS: Target date: 10/24/2022  Patient will report demonstrate independence with final HEP in order to maintain current gains and continue to progress after physical therapy discharge.   Baseline: To be provided Goal status: INITIAL  2.   Patient will improve 5x Sit to Stand score to 27" or less with minimal amount of UE use necessary to demonstrate progress towards a decreased  risk for falls and improved lower extremity strength.   Baseline: 37.34" with mix of thigh assist and UE support, one forward LOB  Goal status: INITIAL  3.  Patient will improve TUG score to 13.5" or less with LRAD and no signs of imbalance to indicate a decreased risk of falls  and demonstrate improved overall mobility.   Baseline: 15.23" without SPC with increased signs of instability, 19.58 Goal status: INITIAL  4.  Patient will improve gait speed to 0.8 m/s to indicate improvement to the level of community ambulator in order to participate more easily in activities outside of the home.   Baseline: 0.60 m/s or 1.97 ft/sec with SPC (SBA) Goal status: INITIAL  5.  Berg balance test to be assessed/goal written as indicated Baseline: To be assessed Goal status: INITIAL  6.  Curb to be assessed/goal written as indicated Baseline: To be assessed Goal status: INITIAL  7.  Patient will improve FOTO score to 69 to achieve predicted improvements in functional mobility due to skilled physical therapy interventions to increase safety with and participation in daily activities. Baseline: 56 Goal status: INITIAL  ASSESSMENT:  CLINICAL IMPRESSION: Patient is a 77 y.o. male who was seen today for physical therapy evaluation and treatment for impairments of an acute on sub-acute left parietal-occipital perventricular infarcts with PMH of ESRD not on HD, renal cancer metastatic to liver, adrenals and lymph nodes no longer on chemo, GERD, L PCA infarct November 2023, DM2, and anemia. Patient presents with impairments in cognition, gait/mobility, tone, balance, strength, ROM, and activity tolerance. Patient demonstrates changes from when last seen at this clinic including > 10" difference in 5xSTS and nearly 1.0 ft/sec difference in gait speed. Patient is at an increased risk for falls as indicated by TUG, gait speed, and 5xSTS. Patient also demonstrates decreased awareness of how to use AD. Patient will benefit from skilled PT services to improve deficits noted above in order to more safely navigate curbs.   OBJECTIVE IMPAIRMENTS: Abnormal gait, decreased activity tolerance, decreased balance, decreased cognition, decreased endurance, decreased knowledge of condition, decreased  knowledge of use of DME, decreased mobility, difficulty walking, decreased ROM, decreased strength, decreased safety awareness, impaired tone, and impaired vision/preception.   ACTIVITY LIMITATIONS: carrying, bending, standing, squatting, transfers, and locomotion level  PARTICIPATION LIMITATIONS: laundry, driving, community activity, and  yard work  PERSONAL FACTORS: Age, Past/current experiences, and 3+ comorbidities: see above  are also affecting patient's functional outcome.   REHAB POTENTIAL: Good  CLINICAL DECISION MAKING: Evolving/moderate complexity  EVALUATION COMPLEXITY: Moderate  PLAN:  PT FREQUENCY: 2x/week  PT DURATION: 8 weeks  PLANNED INTERVENTIONS: Therapeutic exercises, Therapeutic activity, Neuromuscular re-education, Balance training, Gait training, Patient/Family education, Self Care, Joint mobilization, Manual therapy, and Re-evaluation  PLAN FOR NEXT SESSION: assess curb and berg/write goals, update HEP, balance and high intensity gait training   Carmelia Bake, PT, DPT 08/29/2022, 12:31 PM

## 2022-08-30 ENCOUNTER — Ambulatory Visit (INDEPENDENT_AMBULATORY_CARE_PROVIDER_SITE_OTHER): Payer: Medicare Other

## 2022-08-30 ENCOUNTER — Ambulatory Visit (INDEPENDENT_AMBULATORY_CARE_PROVIDER_SITE_OTHER): Payer: Medicare Other | Admitting: Internal Medicine

## 2022-08-30 ENCOUNTER — Encounter: Payer: Self-pay | Admitting: Internal Medicine

## 2022-08-30 ENCOUNTER — Encounter: Payer: Self-pay | Admitting: Physician Assistant

## 2022-08-30 ENCOUNTER — Ambulatory Visit: Payer: Medicare Other | Admitting: Physician Assistant

## 2022-08-30 VITALS — BP 154/83 | HR 74 | Resp 18 | Ht 75.0 in | Wt 196.0 lb

## 2022-08-30 VITALS — BP 148/86 | HR 79 | Temp 98.5°F | Ht 75.0 in | Wt 195.0 lb

## 2022-08-30 DIAGNOSIS — M533 Sacrococcygeal disorders, not elsewhere classified: Secondary | ICD-10-CM | POA: Diagnosis not present

## 2022-08-30 DIAGNOSIS — I5032 Chronic diastolic (congestive) heart failure: Secondary | ICD-10-CM

## 2022-08-30 DIAGNOSIS — L2084 Intrinsic (allergic) eczema: Secondary | ICD-10-CM

## 2022-08-30 DIAGNOSIS — D538 Other specified nutritional anemias: Secondary | ICD-10-CM | POA: Diagnosis not present

## 2022-08-30 DIAGNOSIS — I639 Cerebral infarction, unspecified: Secondary | ICD-10-CM | POA: Diagnosis not present

## 2022-08-30 DIAGNOSIS — I63532 Cerebral infarction due to unspecified occlusion or stenosis of left posterior cerebral artery: Secondary | ICD-10-CM | POA: Insufficient documentation

## 2022-08-30 DIAGNOSIS — I69318 Other symptoms and signs involving cognitive functions following cerebral infarction: Secondary | ICD-10-CM

## 2022-08-30 DIAGNOSIS — N185 Chronic kidney disease, stage 5: Secondary | ICD-10-CM

## 2022-08-30 MED ORDER — ZINC GLUCONATE 50 MG PO TABS: 50.0000 mg | ORAL_TABLET | Freq: Every day | ORAL | 1 refills | Status: DC

## 2022-08-30 MED ORDER — FLUOCINONIDE EMULSIFIED BASE 0.05 % EX CREA: 1.0000 | TOPICAL_CREAM | Freq: Two times a day (BID) | CUTANEOUS | 2 refills | Status: DC

## 2022-08-30 MED ORDER — CLOPIDOGREL BISULFATE 75 MG PO TABS: 75.0000 mg | ORAL_TABLET | Freq: Every day | ORAL | 3 refills | Status: DC

## 2022-08-30 NOTE — Progress Notes (Signed)
Subjective:  Patient ID: Antonio Adams, male    DOB: 02-11-1946  Age: 77 y.o. MRN: [id number]  CC: Annual Exam, Congestive Heart Failure, Hypertension, and Back Pain   HPI [name] presents for f/up ----  He was recently admitted for acute ischemic stroke.  His only residual symptoms are memory impairment and decreased vision.  He has a stable degree of ataxia.  He tells me he fell 3 weeks ago and injured his tailbone and has pain in the area.  He is taking Tylenol to control the pain.  PCP: Antonio Grandchild, MD   Admit date: 08/24/2022 Discharge date: 08/25/2022 Admitted From: Home Disposition: Home Recommendations for Outpatient Follow-up:  Follow up with PCP in 1 to 2 weeks Outpatient follow-up with neurology Check CMP and CBC at follow-up Outpatient follow-up with nephrology and urology as previously planned Please follow up on the following pending results: None   Home Health: Ambulatory referral for PT/OT/SLP ordered Equipment/Devices: Shower seat  Outpatient Medications Prior to Visit  Medication Sig Dispense Refill   acetaminophen (TYLENOL) 500 MG tablet Take 1,000 mg by mouth every 6 (six) hours as needed for mild pain.     aspirin EC 81 MG tablet Take 1 tablet (81 mg total) by mouth daily for 21 days. Swallow whole. 21 tablet 0   atorvastatin (LIPITOR) 80 MG tablet Take 1 tablet (80 mg total) by mouth daily. 90 tablet 1   calcitRIOL (ROCALTROL) 0.25 MCG capsule Take 0.25 mcg by mouth daily.     Cyanocobalamin (B-12 PO) Take 1,000 mcg by mouth daily.     cyclobenzaprine (FLEXERIL) 5 MG tablet Take 1 tablet (5 mg total) by mouth 2 (two) times daily as needed for muscle spasms. 6 tablet 0   DULoxetine (CYMBALTA) 20 MG capsule Take 1 capsule (20 mg total) by mouth daily. 30 capsule 5   ferrous sulfate 325 (65 FE) MG tablet Take 1 tablet (325 mg total) by mouth 2 (two) times daily. Patient needs office visit for further refills (Patient taking differently:  Take 325 mg by mouth daily with breakfast.) 60 tablet 1   furosemide (LASIX) 40 MG tablet TAKE 1 TABLET BY MOUTH EVERY DAY 90 tablet 1   metoprolol tartrate (LOPRESSOR) 25 MG tablet TAKE 1 TABLET BY MOUTH TWICE A DAY 180 tablet 3   mirtazapine (REMERON SOL-TAB) 15 MG disintegrating tablet Take 15 mg by mouth at bedtime.     pantoprazole (PROTONIX) 20 MG tablet Take 1 tablet (20 mg total) by mouth daily. (Patient taking differently: Take 20 mg by mouth daily as needed for heartburn or indigestion.) 30 tablet 0   polyvinyl alcohol (LIQUIFILM TEARS) 1.4 % ophthalmic solution Place 1 drop into both eyes daily as needed for dry eyes.     sodium bicarbonate 650 MG tablet Take 2 tablets (1,300 mg total) by mouth 2 (two) times daily. 60 tablet 1   solifenacin (VESICARE) 5 MG tablet Take 1 tablet (5 mg total) by mouth daily. 90 tablet 1   tamsulosin (FLOMAX) 0.4 MG CAPS capsule TAKE 1 CAPSULE (0.4 MG TOTAL) BY MOUTH DAILY. (Patient taking differently: Take 0.4 mg by mouth daily.) 30 capsule 1   Apoaequorin (PREVAGEN EXTRA STRENGTH PO) Take 1 tablet by mouth daily.     clopidogrel (PLAVIX) 75 MG tablet Take 1 tablet (75 mg total) by mouth daily. 90 tablet 1   fluocinonide-emollient (LIDEX-E) 0.05 % cream Apply 1 Application topically 2 (two) times daily as needed (dry skin).  LOKELMA 10 g PACK packet Take 10 g by mouth daily.     Omega-3 Fatty Acids (FISH OIL PO) Take 1,000 mg by mouth 2 (two) times a week.     OVER THE COUNTER MEDICATION Take 1 tablet by mouth daily. Focus factor     sildenafil (VIAGRA) 100 MG tablet Take 100 mg by mouth daily as needed.     zinc gluconate 50 MG tablet Take 1 tablet (50 mg total) by mouth daily. 90 tablet 1   No facility-administered medications prior to visit.    ROS Review of Systems  Constitutional: Negative.  Negative for chills, diaphoresis, fatigue and fever.  HENT: Negative.    Eyes:  Positive for visual disturbance.  Respiratory:  Negative for chest  tightness, shortness of breath and wheezing.   Cardiovascular:  Negative for chest pain, palpitations and leg swelling.  Gastrointestinal:  Negative for abdominal pain, constipation, diarrhea and vomiting.  Endocrine: Negative.   Genitourinary: Negative.  Negative for difficulty urinating.  Musculoskeletal:  Positive for arthralgias, back pain and gait problem. Negative for joint swelling, myalgias and neck pain.       +++ tailbone pain 3 weeks after a fall  Skin:  Positive for rash. Negative for color change.  Neurological:  Negative for dizziness and weakness.  Hematological:  Negative for adenopathy. Does not bruise/bleed easily.  Psychiatric/Behavioral:  Positive for confusion and decreased concentration. Negative for behavioral problems and self-injury. The patient is not nervous/anxious.     Objective:  BP (!) 148/86 (BP Location: Right Arm, Patient Position: Sitting)   Pulse 79   Temp 98.5 F (36.9 C) (Oral)   Ht 6\' 3"  (1.905 m)   Wt 195 lb (88.5 kg)   SpO2 95%   BMI 24.37 kg/m   BP Readings from Last 3 Encounters:  09/01/22 (!) 170/90  08/30/22 (!) 154/83  08/30/22 (!) 148/86    Wt Readings from Last 3 Encounters:  09/01/22 194 lb (88 kg)  08/30/22 196 lb (88.9 kg)  08/30/22 195 lb (88.5 kg)    Physical Exam Vitals reviewed.  Constitutional:      General: He is not in acute distress.    Appearance: He is not toxic-appearing or diaphoretic.  HENT:     Mouth/Throat:     Mouth: Mucous membranes are moist.  Eyes:     General: No scleral icterus.    Conjunctiva/sclera: Conjunctivae normal.  Cardiovascular:     Rate and Rhythm: Normal rate and regular rhythm.     Heart sounds: Murmur heard.     Systolic murmur is present with a grade of 2/6.     No diastolic murmur is present.     No gallop.  Pulmonary:     Effort: Pulmonary effort is normal.     Breath sounds: No stridor. No wheezing, rhonchi or rales.  Abdominal:     General: Abdomen is flat.      Palpations: There is no mass.     Tenderness: There is no abdominal tenderness. There is no guarding.     Hernia: No hernia is present.  Musculoskeletal:     Cervical back: Neck supple.     Thoracic back: Normal.     Lumbar back: Normal. Normal range of motion. Negative right straight leg raise test and negative left straight leg raise test.     Right lower leg: No edema.     Left lower leg: No edema.  Lymphadenopathy:     Cervical: No cervical adenopathy.  Skin:    General: Skin is warm and dry.  Neurological:     Mental Status: He is alert. Mental status is at baseline.     Lab Results  Component Value Date   WBC 3.4 (L) 08/25/2022   HGB 10.6 (L) 08/25/2022   HCT 32.3 (L) 08/25/2022   PLT 216 08/25/2022   GLUCOSE 76 08/25/2022   CHOL 227 (H) 08/25/2022   TRIG 79 08/25/2022   HDL 47 08/25/2022   LDLCALC 164 (H) 08/25/2022   ALT 19 08/24/2022   AST 19 08/24/2022   NA 139 08/25/2022   K 5.1 08/25/2022   CL 110 08/25/2022   CREATININE 9.18 (H) 08/25/2022   BUN 67 (H) 08/25/2022   CO2 18 (L) 08/25/2022   TSH 2.577 10/04/2021   PSA 0.01 (L) 05/20/2020   INR 1.2 05/24/2021   HGBA1C 5.4 08/25/2022   MICROALBUR 97.0 12/18/2019    MR ANGIO HEAD WO CONTRAST  Result Date: 08/25/2022 CLINICAL DATA:  Confusion and hallucinations. EXAM: MRA HEAD WITHOUT CONTRAST TECHNIQUE: Angiographic images of the Circle of Willis were acquired using MRA technique without intravenous contrast. COMPARISON:  Brain MRA 04/08/2022 FINDINGS: Image quality at the skull base is degraded by motion artifact. Anterior circulation: Irregularity of the high cervical internal carotid arteries is favored artifactual. The intracranial ICAs appear patent, without hemodynamically significant stenosis or occlusion. The bilateral MCAs are patent, without proximal high-grade stenosis or occlusion. The bilateral ACAs are patent, without proximal high-grade stenosis or occlusion. The right A1 segment is diminutive,  unchanged. There is no definite aneurysm or AVM. Posterior circulation: The bilateral V4 segments are patent. The basilar artery is patent. The bilateral PCAs are patent, without proximal high-grade stenosis or occlusion. The major cerebellar arteries appear patent. Posterior communicating arteries are not definitely seen. There is no aneurysm or AVM. Anatomic variants: As above. Other: None. IMPRESSION: Patent intracranial vasculature without evidence of hemodynamically significant stenosis or occlusion. Electronically Signed   By: Lesia Hausen M.D.   On: 08/25/2022 13:35   VAS US CAROTID  Result Date: 08/25/2022 Carotid Arterial Duplex Study Patient Name:  BRAXEN DOBEK  Date of Exam:   08/25/2022 Medical Rec #: [id number]            Accession #:    [id number] Date of Birth: Jul 23, 1945           Patient Gender: M Patient Age:   61 years Exam Location:  [facility] Procedure:      VAS US CAROTID Referring Phys: Terrilee Files [facility] --------------------------------------------------------------------------------  Indications:       CVA. Risk Factors:      Hypertension, hyperlipidemia, Diabetes, prior CVA. Comparison Study:  Previous 04/08/22. Performing Technologist: McKayla Maag RVT, [place]  Examination Guidelines: A complete evaluation includes B-mode imaging, spectral Doppler, color Doppler, and power Doppler as needed of all accessible portions of each vessel. Bilateral testing is considered an integral part of a complete examination. Limited examinations for reoccurring indications may be performed as noted.  Right Carotid Findings: +----------+--------+--------+--------+------------------+------------------+           PSV cm/sEDV cm/sStenosisPlaque DescriptionComments           +----------+--------+--------+--------+------------------+------------------+ CCA Prox  52      17                                intimal thickening  +----------+--------+--------+--------+------------------+------------------+ CCA Distal51      11  intimal thickening +----------+--------+--------+--------+------------------+------------------+ ICA Prox  29      11      1-39%   heterogenous                         +----------+--------+--------+--------+------------------+------------------+ ICA Mid   42      13                                                   +----------+--------+--------+--------+------------------+------------------+ ICA Distal40      14                                                   +----------+--------+--------+--------+------------------+------------------+ ECA       52      9                                                    +----------+--------+--------+--------+------------------+------------------+ +----------+--------+-------+----------------+-------------------+           PSV cm/sEDV cmsDescribe        Arm Pressure (mmHG) +----------+--------+-------+----------------+-------------------+ [id number]             Multiphasic, WNL                    +----------+--------+-------+----------------+-------------------+ +---------+--------+--+--------+--+---------+ VertebralPSV cm/s25EDV cm/s10Antegrade +---------+--------+--+--------+--+---------+  Left Carotid Findings: +---------+--------+-------+--------+------------------------+-----------------+          PSV cm/sEDV    StenosisPlaque Description      Comments                           cm/s                                                     +---------+--------+-------+--------+------------------------+-----------------+ CCA Prox 88      18                                     intimal                                                                   thickening        +---------+--------+-------+--------+------------------------+-----------------+ CCA      45      12                                      intimal           Distal  thickening        +---------+--------+-------+--------+------------------------+-----------------+ ICA Prox 44      9      1-39%   heterogenous and                                                          irregular                                 +---------+--------+-------+--------+------------------------+-----------------+ ICA Mid  41      19                                                       +---------+--------+-------+--------+------------------------+-----------------+ ICA      58      22                                                       Distal                                                                    +---------+--------+-------+--------+------------------------+-----------------+ ECA      30      6                                                        +---------+--------+-------+--------+------------------------+-----------------+ +----------+--------+--------+----------------+-------------------+           PSV cm/sEDV cm/sDescribe        Arm Pressure (mmHG) +----------+--------+--------+----------------+-------------------+ [id number]              Multiphasic, WNL                    +----------+--------+--------+----------------+-------------------+ +---------+--------+--+--------+--+---------+ VertebralPSV cm/s38EDV cm/s10Antegrade +---------+--------+--+--------+--+---------+   Summary: Right Carotid: Velocities in the right ICA are consistent with a 1-39% stenosis. Left Carotid: Velocities in the left ICA are consistent with a 1-39% stenosis. Vertebrals:  Bilateral vertebral arteries demonstrate antegrade flow. Subclavians: Normal flow hemodynamics were seen in bilateral subclavian              arteries. *See table(s) above for measurements and observations.  Electronically signed by Delia Heady MD on 08/25/2022  at 1:17:16 PM.    Final    ECHOCARDIOGRAM COMPLETE  Result Date: 08/25/2022    ECHOCARDIOGRAM REPORT   Patient Name:   ATREYU MAK Date of Exam: 08/25/2022 Medical Rec #:  [id number]           Height:       75.0 in Accession #:    [id number]  Weight:       191.8 lb Date of Birth:  October 24, 1945          BSA:          2.156 m Patient Age:    76 years            BP:           180/101 mmHg Patient Gender: M                   HR:           62 bpm. Exam Location:  Inpatient Procedure: 2D Echo, 3D Echo, Color Doppler and Cardiac Doppler Indications:    Stroke I63.9  History:        Patient has prior history of Echocardiogram examinations, most                 recent 04/08/2022. Stroke; Risk Factors:Diabetes, Dyslipidemia,                 Non-Smoker and Hypertension.  Sonographer:    Aron Baba Referring Phys: [id number] MIR [facility]  Sonographer Comments: Suboptimal subcostal window. Image acquisition challenging due to respiratory motion. IMPRESSIONS  1. Left ventricular ejection fraction, by estimation, is 50 to 55%. The left ventricle has low normal function. The left ventricle has no regional wall motion abnormalities. Left ventricular diastolic parameters are consistent with Grade I diastolic dysfunction (impaired relaxation).  2. Right ventricular systolic function is normal. The right ventricular size is normal. Tricuspid regurgitation signal is inadequate for assessing PA pressure.  3. Left atrial size was mild to moderately dilated.  4. No evidence of mitral valve regurgitation.  5. The aortic valve was not well visualized. Aortic valve regurgitation is not visualized.  6. There is mild dilatation of the aortic root, measuring 40 mm.  7. The inferior vena cava is dilated in size with >50% respiratory variability, suggesting right atrial pressure of 8 mmHg. FINDINGS  Left Ventricle: Left ventricular ejection fraction, by estimation, is 50 to 55%. The left ventricle has low normal function. The  left ventricle has no regional wall motion abnormalities. The left ventricular internal cavity size was normal in size. There is no left ventricular hypertrophy. Left ventricular diastolic parameters are consistent with Grade I diastolic dysfunction (impaired relaxation). Right Ventricle: The right ventricular size is normal. Right ventricular systolic function is normal. Tricuspid regurgitation signal is inadequate for assessing PA pressure. Left Atrium: Left atrial size was mild to moderately dilated. Right Atrium: Right atrial size was normal in size. Pericardium: There is no evidence of pericardial effusion. Mitral Valve: No evidence of mitral valve regurgitation. Tricuspid Valve: Tricuspid valve regurgitation is not demonstrated. Aortic Valve: The aortic valve was not well visualized. Aortic valve regurgitation is not visualized. Aortic valve mean gradient measures 11.0 mmHg. Aortic valve peak gradient measures 18.9 mmHg. Aortic valve area, by VTI measures 1.53 cm. Pulmonic Valve: Pulmonic valve regurgitation is not visualized. Aorta: There is mild dilatation of the aortic root, measuring 40 mm. Venous: The inferior vena cava is dilated in size with greater than 50% respiratory variability, suggesting right atrial pressure of 8 mmHg.  LEFT VENTRICLE PLAX 2D LVIDd:         5.60 cm   Diastology LVIDs:         4.00 cm   LV e' medial:    4.82 cm/s LV PW:         1.20 cm   LV E/e' medial:  15.6  LV IVS:        0.70 cm   LV e' lateral:   5.70 cm/s LVOT diam:     2.40 cm   LV E/e' lateral: 13.2 LV SV:         76 LV SV Index:   35 LVOT Area:     4.52 cm                           3D Volume EF:                          3D EF:        46 %                          LV EDV:       198 ml                          LV ESV:       107 ml                          LV SV:        91 ml RIGHT VENTRICLE RV S prime:     6.96 cm/s TAPSE (M-mode): 1.8 cm LEFT ATRIUM             Index        RIGHT ATRIUM           Index LA diam:         4.00 cm 1.86 cm/m   RA Area:     24.70 cm LA Vol (A2C):   67.8 ml 31.45 ml/m  RA Volume:   85.60 ml  39.71 ml/m LA Vol (A4C):   90.2 ml 41.84 ml/m LA Biplane Vol: 78.0 ml 36.18 ml/m  AORTIC VALVE AV Area (Vmax):    1.49 cm AV Area (Vmean):   1.41 cm AV Area (VTI):     1.53 cm AV Vmax:           217.50 cm/s AV Vmean:          155.000 cm/s AV VTI:            0.496 m AV Peak Grad:      18.9 mmHg AV Mean Grad:      11.0 mmHg LVOT Vmax:         71.80 cm/s LVOT Vmean:        48.300 cm/s LVOT VTI:          0.168 m LVOT/AV VTI ratio: 0.34  AORTA Ao Root diam: 4.00 cm Ao Asc diam:  3.80 cm MITRAL VALVE MV Area (PHT): 3.31 cm    SHUNTS MV Decel Time: 229 msec    Systemic VTI:  0.17 m MV E velocity: 75.20 cm/s  Systemic Diam: 2.40 cm MV A velocity: 77.10 cm/s MV E/A ratio:  0.98 Photographer signed by Carolan Clines Signature Date/Time: 08/25/2022/11:23:51 AM    Final    MR BRAIN WO CONTRAST  Result Date: 08/24/2022 CLINICAL DATA:  Neuro deficit, acute, stroke suspected EXAM: MRI HEAD WITHOUT CONTRAST TECHNIQUE: Multiplanar, multiecho pulse sequences of the brain and surrounding structures were obtained without intravenous contrast. COMPARISON:  Same day CT head. FINDINGS: Brain: Acute left parieto-occipital periventricular infarcts in the region of prior infarct. No  evidence of acute hemorrhage, mass lesion, midline shift or hydrocephalus. Cerebral atrophy. Scattered T2/FLAIR hyperintensities the white matter, compatible with chronic microvascular ischemic change. Vascular: Major arterial flow voids are maintained at the skull base. Skull and upper cervical spine: Normal marrow signal. Sinuses/Orbits: Clear sinuses.  No acute findings. Other: No mastoid effusions. IMPRESSION: Acute left parieto-occipital periventricular infarcts in the region of prior infarct. Electronically Signed   By: Feliberto Harts M.D.   On: 08/24/2022 11:35   CT Head Wo Contrast  Result Date: 08/24/2022 CLINICAL DATA:   Mental status change, unknown cause. EXAM: CT HEAD WITHOUT CONTRAST TECHNIQUE: Contiguous axial images were obtained from the base of the skull through the vertex without intravenous contrast. RADIATION DOSE REDUCTION: This exam was performed according to the departmental dose-optimization program which includes automated exposure control, adjustment of the mA and/or kV according to patient size and/or use of iterative reconstruction technique. COMPARISON:  Head CT 04/07/2022 and MRI 04/08/2022 FINDINGS: Brain: A moderate-sized chronic left PCA infarct is present involving the left occipital lobe and thalamus (acute to subacute on the prior studies). A lacunar infarct in the right thalamus is new and of indeterminate acuity. No acute cortically based infarct intracranial hemorrhage, mass, midline shift, or extra-axial fluid collection is identified. There is a background of mild chronic small vessel ischemia in the cerebral white matter. There is mild-to-moderate cerebral atrophy. Vascular: Calcified atherosclerosis at the skull base. No hyperdense vessel. Skull: No fracture or suspicious osseous lesion. Sinuses/Orbits: Visualized paranasal sinuses and mastoid air cells are clear. Unremarkable orbits. Other: None. IMPRESSION: 1. Age-indeterminate right thalamic lacunar infarct. 2. Chronic left PCA infarct. Electronically Signed   By: Sebastian Ache M.D.   On: 08/24/2022 09:07   DG Chest 2 View  Result Date: 08/24/2022 CLINICAL DATA:  Altered level of consciousness. EXAM: CHEST - 2 VIEW COMPARISON:  One-view chest x-ray 04/07/2022 FINDINGS: The heart is mildly enlarged. Mild pulmonary vascular congestion is present. No frank edema or focal airspace disease is present. The visualized soft tissues and bony thorax are unremarkable. IMPRESSION: Mild cardiomegaly and pulmonary vascular congestion without frank edema. Electronically Signed   By: Marin Roberts M.D.   On: 08/24/2022 08:33    DG  Sacrum/Coccyx  Result Date: 08/30/2022 CLINICAL DATA:  Pain. EXAM: SACRUM AND COCCYX - 3 VIEW COMPARISON:  None Available. FINDINGS: There is no evidence of fracture or other focal bone lesions. Lumbosacral degenerative changes are noted. There are prostate brachytherapy seeds. IMPRESSION: No acute osseous abnormalities. Lumbosacral degenerative changes are noted. Electronically Signed   By: Layla Maw M.D.   On: 08/30/2022 09:23     Assessment & Plan:   Traumatic coccydynia - Plain films are negative for fracture. -     DG Sacrum/Coccyx; Future  Intrinsic eczema -     Fluocinonide Emulsified Base; Apply 1 Application topically 2 (two) times daily.  Dispense: 60 g; Refill: 2  Anemia due to zinc deficiency -     Zinc Gluconate; Take 1 tablet (50 mg total) by mouth daily.  Dispense: 90 tablet; Refill: 1  Acute ischemic stroke- Risk factor modifications addressed.  No further complications noted.     Follow-up: Return if symptoms worsen or fail to improve.  Sanda Linger, MD

## 2022-08-30 NOTE — Patient Instructions (Signed)
Tailbone Injury  A tailbone injury is an injury on the small bone at the lower end of the backbone (spine). The injury can happen if the tailbone is bruised, the ligaments are stretched, or the bone is broken (fracture). What are the causes? A falling and landing on the tailbone. Strain or friction on the tailbone. This comes from sitting for long periods of time, or rowing or biking. Giving birth. What are the signs or symptoms? Pain in the tailbone area or lower back. Pain or difficulty when standing up from a sitting position. Bruising or swelling in the tailbone area. Pain when pooping. In females, pain during sex. How is this treated? Most tailbone injuries get better on their own in 4-6 weeks. If you need treatment, it may include: Taking medicines for pain. Using a rubber or inflated ring or cushion when sitting. Doing physical therapy. Getting shots (injections) of local anesthesia and steroid medicine. Follow these instructions at home: Activity Rest as told by your doctor. Do not sit for a long time without moving. Get up to take short walks every 1 to 2 hours. Ask for help if you feel weak or unsteady. Wear proper pads and gear when riding a bike or rowing. Do exercises as told by your doctor or physical therapist. Increase your activity as the pain allows. Return to your normal activities when your doctor says that it is safe. Managing pain, stiffness, and swelling To lessen pain: Sit on a large rubber or inflated ring or cushion. Lean forward when you sit. If told, put ice on the injured area. To do this: Put ice in a plastic bag. Place a towel between your skin and the bag. Leave the ice on for 20 minutes, 2-3 times per day. Do this for the first 1-2 days. If your skin turns bright red, take off the ice right away to prevent skin damage. The risk of skin damage is higher if you cannot feel pain, heat, or cold. If told, put heat on the injured area. Do this as often  as told by your doctor. Use the heat source that your doctor recommends, such as a moist heat pack or a heating pad. Place a towel between your skin and the heat source. Leave the heat on for 20-30 minutes. If your skin turns bright red, take off the heat right away to prevent burns. The risk of burns is higher if you cannot feel pain, heat, or cold. General instructions Take over-the-counter and prescription medicines only as told by your doctor. You may need to take these actions to prevent or treat trouble pooping (constipation) or pain when pooping: Drink enough fluid to keep your pee (urine) pale yellow. Take over-the-counter or prescription medicines. Eat foods that are high in fiber. These include beans, whole grains, and fresh fruits and vegetables. Limit foods that are high in fat and sugar. These include fried or sweet foods. Contact a doctor if: Your pain gets worse. Your pain is not controlled with medicines. Pooping causes you pain. You cannot poop after 4 days. You have pain during sex. Summary A tailbone injury is an injury on the small bone at the lower end of the backbone (spine). These injuries can be painful. Most tailbone injuries get better on their own in 4-6 weeks. Sit on a large rubber or inflated ring or cushion to lessen pain. Do not sit for a long time without moving. Follow your doctor's suggestions to prevent or treat trouble pooping. This information is not   intended to replace advice given to you by your health care provider. Make sure you discuss any questions you have with your health care provider. Document Revised: 08/09/2021 Document Reviewed: 08/09/2021 Elsevier Patient Education  2023 [employer] Inc.  

## 2022-08-30 NOTE — Patient Instructions (Signed)
It was a pleasure to see you today at our office.   Recommendations:  Follow up in  6 months Continue Plavix indefinitely, will give you 1 tab daily, 1 year supply , refill after that with either PCP or Heart doctor Continue baby ASA  as directed until completion  Recommend PT, and speech therapy   Whom to call:  If you have any severe symptoms of a stroke, or other severe issues such as confusion,severe chills or fever, etc call 911 or go to the ER as you may need to be evaluated further          RECOMMENDATIONS FOR ALL PATIENTS WITH MEMORY PROBLEMS: 1. Continue to exercise (Recommend 30 minutes of walking everyday, or 3 hours every week) 2. Increase social interactions - continue going to [place] and enjoy social gatherings with friends and family 3. Eat healthy, avoid fried foods and eat more fruits and vegetables 4. Maintain adequate blood pressure, blood sugar, and blood cholesterol level. Reducing the risk of stroke and cardiovascular disease also helps promoting better memory. 5. Avoid stressful situations. Live a simple life and avoid aggravations. Organize your time and prepare for the next day in anticipation. 6. Sleep well, avoid any interruptions of sleep and avoid any distractions in the bedroom that may interfere with adequate sleep quality 7. Avoid sugar, avoid sweets as there is a strong link between excessive sugar intake, diabetes, and cognitive impairment We discussed the Mediterranean diet, which has been shown to help patients reduce the risk of progressive memory disorders and reduces cardiovascular risk. This includes eating fish, eat fruits and green leafy vegetables, nuts like almonds and hazelnuts, walnuts, and also use olive oil. Avoid fast foods and fried foods as much as possible. Avoid sweets and sugar as sugar use has been linked to worsening of memory function.    DRIVING: Regarding driving, in patients with progressive memory problems, driving will be  impaired. We advise to have someone else do the driving if trouble finding directions or if minor accidents are reported. Independent driving assessment is available to determine safety of driving.   If you are interested in the driving assessment, you can contact the following:  [facility] in [place] [phone]  [facility] [phone]  [facility] [phone]  [facility] [phone] or [phone]

## 2022-08-30 NOTE — Progress Notes (Signed)
NEUROLOGY FOLLOW UP OFFICE NOTE  Antonio Adams [id number]  Assessment/Plan:   Antonio Adams  is a 77 y.o. RH male with history of DM2 with diabetic polyneuropathy, OSA on nightly CPAP, PTSD, history of lumbar stenosis with chronic back pain, metastatic renal carcinoma with lymphadenopathy and questionable hepatic involvement noted in 2022, not on chemo due to poor tolerance to the medications, and without significant progression since May 2023, only on surveillance imaging, stage IV, eczema, vitamin D deficiency, CKD stage V refusing HD, BPH, anemia of CKD  Zinc deficiency, chronic heart failure, mild cognitive impairment, presenting for in follow up for evaluation of  recent left parieto-occipital periventricular CVA. Since his last visit, the patient unfortunately had a new presentation to the hospital after experiencing vertigo, difficulty understanding and following instructions, and unresolved R hemianopsia (08/28/2022).  MRI of the brain showed  acute left parieto-occipital periventricular infarct.  MRA of the head and neck, carotid ultrasound, TTE did not show any significant findings.  Hospital neurology placed the patient on aspirin and Plavix for 3 months, then followed by Plavix alone and aggressive risk reduction.  He is now on Lipitor (priorly on Crestor).    left parieto-occipital periventricular infarct, likely due to SVD Old left parieto-occipital periventricular infarct (while on ASA)  Continue ASA And Plavix for 3 months, then Plavix alone given Stage V CKD not on HD ( patient's choice) Recommend good control of cardiovascular risk factors.  Follow with Cards Continue PT OT as outpatient Continue ST  Return to clinic in 6 months.     Memory Impairment    Patient not interested in pursuing memory issues at this time  Subjective:     Initial Visit 04/12/22 : Antonio Adams is a 77 y.o. R-handed male with history of DM2 with diabetic polyneuropathy, OSA on  nightly CPAP, PTSD, history of lumbar stenosis with chronic back pain, metastatic renal carcinoma with lymphadenopathy and questionable hepatic involvement noted in 2022, not on chemo due to poor tolerance to the medications, and without significant progression since May 2023, only on surveillance imaging, stage IV, eczema, vitamin D deficiency, CKD stage V refusing HD, BPH, anemia of CKD  Zinc deficiency, chronic heart failure, mild cognitive impairment, presenting for evaluation of left PCA territory acute CVA.  In review, the patient presented to the ED on 04/07/22 with several day history of lethargy with fall 2 days prior to presentation, as well as 1 day history of mild left frontal temporal dull headache initially felt to be due to anemia.  He denied any  unilateral weakness, numbness, or speech difficulty. Although he denied any vision changes, it was documented that the patient had hemianopia (he denies during this visit) .Patient  never had a similar episode. Denies any history of TIA. Denies vertigo or dizziness. Denies headaches during this visit, dysarthria or dysphagia.  He has a history of MCI, and patient denies any significant memory issues.  Denies any chest pain, or shortness of breath. Denies any fever or chills, or night sweats. No tobacco. No new meds or hormonal supplements. He is on Plavix for 3 months and ASA, then ASA alone. Denies any recent long distance trips or recent surgeries. No sick contacts. No new stressors present in personal life. Patient is compliant with his medications. Patient is not very active. No family history of stroke. Patient was not administered TPA  as is beyond time window for treatment consideration.        Stroke workup at  the hospital on 04/03/2022, personally reviewed is remarkable for: MRI of the brain showing a left PCA large infarct and punctate right PCA infarct MRA remarkable for left PCA P3 branch occlusion, ICA siphon atherosclerosis with possible  tandem significant stenosis of the right siphon Carotid Dopplers unremarkable 2D echo EF 50 to 55% Lower extremity venous Doppler without DVT LDL 159   Stroke workup 08/2022  Stroke:  left parieto-occipital periventricular infarct.  Old left parieto-occipital periventricular infarct Etiology: Likely small vessel disease CT head age-indeterminate right thalamic lacunar infarct and chronic left PCA infarct, no acute abnormality MRI acute left parieto-occipital periventricular infarct in the region of prior infarct MRA no LVO or hemodynamically significant stenosis Carotid Doppler 1 to 39% stenosis in bilateral ICAs 2D Echo EF 50 to 55%, grade 1 diastolic dysfunction, mild to moderate dilation of left atrium LDL 164 HgbA1c 5.4 VTE prophylaxis - SQH  Past Medical History:  Diagnosis Date   ADENOCARCINOMA, PROSTATE, GLEASON GRADE 6 12/21/2009   prostate cancer   ALLERGIC RHINITIS 03/16/2007   Allergy    Anxiety    Arthritis    back    BACK PAIN WITH RADICULOPATHY 11/23/2008   Cancer of kidney    partial right kidney removed   CARPAL TUNNEL SYNDROME, BILATERAL 03/16/2007   Chronic back pain    Constipation    takes Senokot daily   Depression    occasionally   DIABETES MELLITUS, TYPE II 03/16/2007   takes Januvia and MEtformin daily   GASTROINTESTINAL HEMORRHAGE, HX OF 03/16/2007   GERD 03/16/2007   takes Omeprazole daily   Glaucoma    mild - no eye drops   Hemorrhoids    History of colon polyps    HYPERLIPIDEMIA 03/16/2007   takes Crestor daily   HYPERTENSION 03/16/2007   takes Diltiazem and Lisinopril daily    Kidney cancer, primary, with metastasis from kidney to other site    LEG CRAMPS 05/22/2007   Nocturia    Overactive bladder    PARESTHESIA 05/21/2007   Proctitis    PTSD (post-traumatic stress disorder)    wakes up may be dreaming of fighting   Rectal bleeding    Dr.Norins has explained its from the Radiation that he has received   Rectal bleeding    Renal cell carcinoma     Sleep apnea    uses CPAP nightly   Stomach cancer    Urinary frequency    takes Toviaz daily    MEDICATIONS: Current Outpatient Medications on File Prior to Visit  Medication Sig Dispense Refill   acetaminophen (TYLENOL) 500 MG tablet Take 1,000 mg by mouth every 6 (six) hours as needed for mild pain.     aspirin EC 81 MG tablet Take 1 tablet (81 mg total) by mouth daily for 21 days. Swallow whole. 21 tablet 0   atorvastatin (LIPITOR) 80 MG tablet Take 1 tablet (80 mg total) by mouth daily. 90 tablet 1   calcitRIOL (ROCALTROL) 0.25 MCG capsule Take 0.25 mcg by mouth daily.     clopidogrel (PLAVIX) 75 MG tablet Take 1 tablet (75 mg total) by mouth daily. 90 tablet 1   Cyanocobalamin (B-12 PO) Take 1,000 mcg by mouth daily.     cyclobenzaprine (FLEXERIL) 5 MG tablet Take 1 tablet (5 mg total) by mouth 2 (two) times daily as needed for muscle spasms. 6 tablet 0   DULoxetine (CYMBALTA) 20 MG capsule Take 1 capsule (20 mg total) by mouth daily. 30 capsule 5   ferrous sulfate  325 (65 FE) MG tablet Take 1 tablet (325 mg total) by mouth 2 (two) times daily. Patient needs office visit for further refills (Patient taking differently: Take 325 mg by mouth daily with breakfast.) 60 tablet 1   fluocinonide-emollient (LIDEX-E) 0.05 % cream Apply 1 Application topically 2 (two) times daily. 60 g 2   furosemide (LASIX) 40 MG tablet TAKE 1 TABLET BY MOUTH EVERY DAY 90 tablet 1   metoprolol tartrate (LOPRESSOR) 25 MG tablet TAKE 1 TABLET BY MOUTH TWICE A DAY 180 tablet 3   mirtazapine (REMERON SOL-TAB) 15 MG disintegrating tablet Take 15 mg by mouth at bedtime.     pantoprazole (PROTONIX) 20 MG tablet Take 1 tablet (20 mg total) by mouth daily. (Patient taking differently: Take 20 mg by mouth daily as needed for heartburn or indigestion.) 30 tablet 0   polyvinyl alcohol (LIQUIFILM TEARS) 1.4 % ophthalmic solution Place 1 drop into both eyes daily as needed for dry eyes.     sildenafil (VIAGRA) 100 MG  tablet Take 100 mg by mouth daily as needed.     sodium bicarbonate 650 MG tablet Take 2 tablets (1,300 mg total) by mouth 2 (two) times daily. 60 tablet 1   solifenacin (VESICARE) 5 MG tablet Take 1 tablet (5 mg total) by mouth daily. 90 tablet 1   tamsulosin (FLOMAX) 0.4 MG CAPS capsule TAKE 1 CAPSULE (0.4 MG TOTAL) BY MOUTH DAILY. (Patient taking differently: Take 0.4 mg by mouth daily.) 30 capsule 1   zinc gluconate 50 MG tablet Take 1 tablet (50 mg total) by mouth daily. 90 tablet 1   No current facility-administered medications on file prior to visit.    ALLERGIES: Allergies  Allergen Reactions   Ace Inhibitors Swelling    lisinopril   Gabapentin Other (See Comments)    hallucinations   Xyzal [Levocetirizine] Other (See Comments)    Break out   Opdivo [Nivolumab] Rash    FAMILY HISTORY: Family History  Problem Relation Age of Onset   Cancer Sister 30       Cancer, unsure type   Hypertension Other    Colon cancer Neg Hx    Esophageal cancer Neg Hx    Stomach cancer Neg Hx    Diabetes Neg Hx    Colon polyps Neg Hx    Rectal cancer Neg Hx     .   Objective:    General: No acute distress.  Patient appears well groomed.   Head:  Normocephalic/atraumatic Eyes:  Fundi examined but not visualized. R hemianopsia Neck: supple, no paraspinal tenderness, full range of motion Heart:  Regular rate and rhythm Lungs:  Clear to auscultation bilaterally Back: No paraspinal tenderness Neurological Exam: alert and oriented to person, place, and not to date. Attention span and concentration slightly impaired, recent memory impaired, and remote memory intact, fund of knowledge appropriate.  Speech fluent and not dysarthric, language intact.  CN II-XII intact. Bulk and tone normal, muscle strength 5/5 throughout.  Sensation to light touch, temperature and vibration intact.  Deep tendon reflexes 2+ throughout, toes downgoing.  Finger to nose and heel to shin testing intact.  Gait normal,  Romberg negative.    Marlowe Kays, PA-C

## 2022-09-01 ENCOUNTER — Encounter (HOSPITAL_BASED_OUTPATIENT_CLINIC_OR_DEPARTMENT_OTHER): Payer: Self-pay | Admitting: Family

## 2022-09-01 ENCOUNTER — Ambulatory Visit (HOSPITAL_BASED_OUTPATIENT_CLINIC_OR_DEPARTMENT_OTHER): Payer: Medicare Other | Admitting: Family

## 2022-09-01 VITALS — BP 170/80 | HR 68 | Ht 75.0 in | Wt 194.0 lb

## 2022-09-01 DIAGNOSIS — I5032 Chronic diastolic (congestive) heart failure: Secondary | ICD-10-CM | POA: Diagnosis not present

## 2022-09-01 DIAGNOSIS — I2510 Atherosclerotic heart disease of native coronary artery without angina pectoris: Secondary | ICD-10-CM | POA: Diagnosis not present

## 2022-09-01 DIAGNOSIS — E785 Hyperlipidemia, unspecified: Secondary | ICD-10-CM

## 2022-09-01 DIAGNOSIS — Z8673 Personal history of transient ischemic attack (TIA), and cerebral infarction without residual deficits: Secondary | ICD-10-CM

## 2022-09-01 DIAGNOSIS — I10 Essential (primary) hypertension: Secondary | ICD-10-CM

## 2022-09-01 MED ORDER — ISOSORBIDE MONONITRATE ER 60 MG PO TB24
60.0000 mg | ORAL_TABLET | Freq: Every day | ORAL | 2 refills | Status: DC
Start: 2022-09-01 — End: 2022-11-27

## 2022-09-01 NOTE — Progress Notes (Unsigned)
   Poor appetite  BP at home on home arm cuff 160s-170s/70s-80s.

## 2022-09-01 NOTE — Patient Instructions (Signed)
Medication Instructions:  Your physician has recommended you make the following change in your medication:   START Isosorbide mononitrate  every evening   Can try Melatonin  to  30 minutes prior to bedtime. If you continue to have difficulty sleeping recommend discussing with primary care provider.   *If you need a refill on your cardiac medications before your next appointment, please call your pharmacy*   Follow-Up: At [facility], you and your health needs are our priority.  As part of our continuing mission to provide you with exceptional heart care, we have created designated Provider Care Teams.  These Care Teams include your primary Cardiologist (physician) and Advanced Practice Providers (APPs -  Physician Assistants and Nurse Practitioners) who all work together to provide you with the care you need, when you need it.  We recommend signing up for the patient portal called "MyChart".  Sign up information is provided on this After Visit Summary.  MyChart is used to connect with patients for Virtual Visits (Telemedicine).  Patients are able to view lab/test results, encounter notes, upcoming appointments, etc.  Non-urgent messages can be sent to your provider as well.   To learn more about what you can do with MyChart, go to [email or web address].    Your next appointment:   3 month(s)  Provider:   Jodelle Red, MD or Gillian Shields, NP    Other Instructions  Tips to Measure your Blood Pressure Correctly   Here's what you can do to ensure a correct reading:  Don't drink a caffeinated beverage or smoke during the 30 minutes before the test.  Sit quietly for five minutes before the test begins.  During the measurement, sit in a chair with your feet on the floor and your arm supported so your elbow is at about heart level.  The inflatable part of the cuff should completely cover at least 80% of your upper arm, and the cuff should be placed on bare  skin, not over a shirt.  Don't talk during the measurement.   Blood pressure categories  Blood pressure category SYSTOLIC (upper number)  DIASTOLIC (lower number)  Normal Less than 120 mm Hg and Less than 80 mm Hg  Elevated 120-129 mm Hg and Less than 80 mm Hg  High blood pressure: Stage 1 hypertension 130-139 mm Hg or 80-89 mm Hg  High blood pressure: Stage 2 hypertension 140 mm Hg or higher or 90 mm Hg or higher  Hypertensive crisis (consult your doctor immediately) Higher than 180 mm Hg and/or Higher than 120 mm Hg  Source: American Heart Association and American Stroke Association. For more on getting your blood pressure under control, buy Controlling Your Blood Pressure, a Special Health Report from [facility].   Blood Pressure Log   Date   Time  Blood Pressure  Example: Nov 1 9 AM 124/78

## 2022-09-04 ENCOUNTER — Ambulatory Visit (HOSPITAL_COMMUNITY)
Admission: RE | Admit: 2022-09-04 | Discharge: 2022-09-04 | Disposition: A | Discharge status: Home / Self Care | Payer: Medicare Other | Source: Ambulatory Visit | Attending: Nephrology | Admitting: Nephrology

## 2022-09-04 VITALS — BP 138/80 | HR 75 | Temp 97.3°F | Resp 17

## 2022-09-04 DIAGNOSIS — N185 Chronic kidney disease, stage 5: Secondary | ICD-10-CM | POA: Diagnosis not present

## 2022-09-04 LAB — IRON AND TIBC
Iron: 37 ug/dL — ABNORMAL LOW (ref 45–182)
Saturation Ratios: 14 % — ABNORMAL LOW (ref 17.9–39.5)
TIBC: 266 ug/dL (ref 250–450)
UIBC: 229 ug/dL

## 2022-09-04 LAB — FERRITIN: Ferritin: 503 ng/mL — ABNORMAL HIGH (ref 24–336)

## 2022-09-04 LAB — POCT HEMOGLOBIN-HEMACUE: Hemoglobin: 10 g/dL — ABNORMAL LOW (ref 13.0–17.0)

## 2022-09-04 MED ORDER — EPOETIN ALFA-EPBX 40000 UNIT/ML IJ SOLN: 30000.0000 [IU] | INTRAMUSCULAR | Status: DC

## 2022-09-04 MED ORDER — EPOETIN ALFA-EPBX 40000 UNIT/ML IJ SOLN
INTRAMUSCULAR | Status: AC
  Administered 2022-09-04: 30000 [IU] via SUBCUTANEOUS
  Filled 2022-09-04: qty 1

## 2022-09-05 ENCOUNTER — Ambulatory Visit: Payer: Medicare Other | Admitting: Physical Therapy

## 2022-09-05 ENCOUNTER — Encounter: Payer: Self-pay | Admitting: Physical Therapy

## 2022-09-05 VITALS — BP 137/76 | HR 78

## 2022-09-05 DIAGNOSIS — R2681 Unsteadiness on feet: Secondary | ICD-10-CM | POA: Diagnosis not present

## 2022-09-05 DIAGNOSIS — I639 Cerebral infarction, unspecified: Secondary | ICD-10-CM | POA: Diagnosis not present

## 2022-09-05 DIAGNOSIS — R2689 Other abnormalities of gait and mobility: Secondary | ICD-10-CM | POA: Diagnosis not present

## 2022-09-05 DIAGNOSIS — M6281 Muscle weakness (generalized): Secondary | ICD-10-CM | POA: Diagnosis not present

## 2022-09-05 NOTE — Therapy (Signed)
OUTPATIENT PHYSICAL THERAPY NEURO TREATMENT   Patient Name: Antonio Adams MRN: [id number] DOB:08-20-1945, 77 y.o., male Today's Date: 09/05/2022   PCP: Sanda Linger, MD REFERRING PROVIDER: Almon Hercules, MD  END OF SESSION:  PT End of Session - 09/05/22 0938     Visit Number 2    Number of Visits 17    Date for PT Re-Evaluation 11/07/22    Authorization Type [employer]    Progress Note Due on Visit 10    PT Start Time 0935    PT Stop Time 1015    PT Time Calculation (min) 40 min    Equipment Utilized During Treatment Gait belt    Activity Tolerance Patient tolerated treatment well    Behavior During Therapy WFL for tasks assessed/performed             Past Medical History:  Diagnosis Date   ADENOCARCINOMA, PROSTATE, GLEASON GRADE 6 12/21/2009   prostate cancer   ALLERGIC RHINITIS 03/16/2007   Allergy    Anxiety    Arthritis    back    BACK PAIN WITH RADICULOPATHY 11/23/2008   Cancer of kidney    partial right kidney removed   CARPAL TUNNEL SYNDROME, BILATERAL 03/16/2007   Chronic back pain    Constipation    takes Senokot daily   Depression    occasionally   DIABETES MELLITUS, TYPE II 03/16/2007   takes Januvia and MEtformin daily   GASTROINTESTINAL HEMORRHAGE, HX OF 03/16/2007   GERD 03/16/2007   takes Omeprazole daily   Glaucoma    mild - no eye drops   Hemorrhoids    History of colon polyps    HYPERLIPIDEMIA 03/16/2007   takes Crestor daily   HYPERTENSION 03/16/2007   takes Diltiazem and Lisinopril daily    Kidney cancer, primary, with metastasis from kidney to other site    LEG CRAMPS 05/22/2007   Nocturia    Overactive bladder    PARESTHESIA 05/21/2007   Proctitis    PTSD (post-traumatic stress disorder)    wakes up may be dreaming of fighting   Rectal bleeding    Dr.Norins has explained its from the Radiation that he has received   Rectal bleeding    Renal cell carcinoma    Sleep apnea    uses CPAP nightly   Stomach cancer    Urinary  frequency    takes Toviaz daily   Past Surgical History:  Procedure Laterality Date   ADRENALECTOMY Right 02/26/2020   Procedure: OPEN RIGHT RENAL CELL METASTATECTOMY AND  ADRENALECTOMY;  Surgeon: Crist Fat, MD;  Location: [facility];  Service: Urology;  Laterality: Right;   BACK SURGERY  1983, 2008   repeat surgery; ESI '08   CARPAL TUNNEL RELEASE     bilateral   CERVICAL FUSION     COLONOSCOPY     colonosocpy     ESOPHAGOGASTRODUODENOSCOPY     ED   FLEXIBLE SIGMOIDOSCOPY N/A 01/08/2017   Procedure: FLEXIBLE SIGMOIDOSCOPY;  Surgeon: Hilarie Fredrickson, MD;  Location: [facility] END[facility]COPY;  Service: Endoscopy;  Laterality: N/A;   HOT HEMOSTASIS N/A 01/08/2017   Procedure: HOT HEMOSTASIS (ARGON PLASMA COAGULATION/BICAP);  Surgeon: Hilarie Fredrickson, MD;  Location: [facility] ENDOSCOPY;  Service: Endoscopy;  Laterality: N/A;   IR RADIOLOGIST EVAL & MGMT  12/17/2019   IR RADIOLOGIST EVAL & MGMT  07/01/2020   IR RADIOLOGIST EVAL & MGMT  08/25/2020   LUMBAR LAMINECTOMY  10/30/2011   Procedure: MICRODISCECTOMY LUMBAR LAMINECTOMY;  Surgeon: Guy Sandifer  Otelia Sergeant, MD;  Location: [facility];  Service: Orthopedics;  Laterality: Right;  Right L4-5 and L5-S1 Microdiscectomy   LUMBAR LAMINECTOMY  06/17/2012   Procedure: MICRODISCECTOMY LUMBAR LAMINECTOMY;  Surgeon: Kerrin Champagne, MD;  Location: [facility];  Service: Orthopedics;  Laterality: N/A;  Right L5-S1 microdiscectomy   OPEN PARTIAL HEPATECTOMY  N/A 02/26/2020   Procedure: OPEN PARTIAL HEPATECTOMY, CHOLECYSTECTOMY;  Surgeon: Almond Lint, MD;  Location: [facility];  Service: General;  Laterality: N/A;   POLYPECTOMY     PROSTATE SURGERY  march 2012   seed implant   RADIOFREQUENCY ABLATION N/A 07/21/2020   Procedure: CT MICROWAVE ABLATION;  Surgeon: Sterling Big, MD;  Location: [facility];  Service: Anesthesiology;  Laterality: N/A;   ROBOTIC ASSITED PARTIAL NEPHRECTOMY Right 09/08/2015   Procedure: XI ROBOTIC ASSITED RIGHT PARTIAL NEPHRECTOMY;  Surgeon: Crist Fat, MD;   Location: [facility];  Service: Urology;  Laterality: Right;  Clamp on: 1033 Clamp off: 1103 Total Clamp Time: 30 minutes   SHOULDER ARTHROSCOPY WITH SUBACROMIAL DECOMPRESSION Left 01/20/2016   Procedure: LEFT SHOULDER ARTHROSCOPY WITH EXTENSIVE  DEBRIDEMENT, ACROMIOPLASTY;  Surgeon: Loreta Ave, MD;  Location: [facility];  Service: Orthopedics;  Laterality: Left;   SHOULDER SURGERY  LFT   Stress Cardiolite  12/24/2003   negative for ischemia   UPPER ESOPHAGEAL ENDOSCOPIC ULTRASOUND (EUS)  04/18/2016   [facility]   UPPER GASTROINTESTINAL ENDOSCOPY     WOUND EXPLORATION     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   Patient Active Problem List   Diagnosis Date Noted   Traumatic coccydynia 08/30/2022   Acute ischemic stroke 08/24/2022   Encounter for general adult medical examination with abnormal findings 01/23/2022   Anemia due to zinc deficiency 12/20/2021   Symptomatic anemia 12/15/2021   Cancer related pain 12/15/2021   Chronic heart failure with preserved ejection fraction (HFpEF) 08/12/2021   Tinea cruris 08/12/2021   Anemia due to chronic kidney disease 07/20/2021   CKD (chronic kidney disease) stage 5, GFR less than 15 ml/min 05/24/2021   BPH (benign prostatic hyperplasia) 05/24/2021   Senile nuclear sclerosis 05/20/2021   Vitamin D deficiency 05/20/2021   Vitreous degeneration, unspecified eye 05/20/2021   Intrinsic eczema 04/18/2021   Atherosclerosis of aorta 12/30/2020   Mixed conductive and sensorineural hearing loss of left ear with restricted hearing of right ear 09/01/2020   Metastatic renal cell carcinoma 02/26/2020   Diabetic polyneuropathy associated with type 2 diabetes mellitus 12/18/2019   Murmur, cardiac 10/08/2018   DDD (degenerative disc disease), cervical 10/08/2018   OAB (overactive bladder) 04/04/2018   Seasonal allergic rhinitis due to pollen 11/01/2016   Post traumatic stress disorder (PTSD) 01/29/2014   Spinal stenosis  of lumbar region with radiculopathy 10/30/2011    Class: Chronic   Obstructive sleep apnea 11/24/2010   Cognitive impairment 11/24/2010   ADENOCARCINOMA, PROSTATE, GLEASON GRADE 6 12/21/2009   Essential hypertension 03/16/2007   GERD 03/16/2007    ONSET DATE: 08/25/2022 (referral)   REFERRING DIAG:  I63.9 (ICD-10-CM) - Cerebrovascular accident (CVA), unspecified mechanism    THERAPY DIAG:  Muscle weakness (generalized)  Unsteadiness on feet  Other abnormalities of gait and mobility  Rationale for Evaluation and Treatment: Rehabilitation  SUBJECTIVE:  SUBJECTIVE STATEMENT:  Patient reports he is doing well; reports that he had PCP visit and tracking elevated BP. Reports improvement since medication modification. Denies falls/near falls.   Pt accompanied by: self and significant other - fiance [name]  PERTINENT HISTORY:  Patient was discharged from this setting on 3/7 but returned to the ED on 4/11 due to acute onset of dizziness. Patient found to have an acute left parietal-occipital perventricular infarcts in region of prior infarct.   PMH: ESRD not on HD, renal cancer metastatic to liver, adrenals and lymph nodes no longer on chemo, GERD, L PCA infarct November 2023, DM2, and anemia.  PAIN:  Are you having pain? Yes: NPRS scale: 4/10 Pain location: low back Pain description: achy Aggravating factors: standing Relieving factors: sitting  PRECAUTIONS: Fall - R hemibody  WEIGHT BEARING RESTRICTIONS: No  FALLS: Has patient fallen in last 6 months? Yes. Number of falls 1 at time of initial stroke  PATIENT GOALS: "To step over curb without losing my balance."   OBJECTIVE:   DIAGNOSTIC FINDINGS:   MR BRAIN WO IMPRESSION on 08/24/2022: Acute left parieto-occipital periventricular  infarcts in the region of prior infarct.  COGNITION: Overall cognitive status: Impaired   TODAY'S TREATMENT:                                                                                                                               Vitals:   09/05/22 0943  BP: 137/76  Pulse: 78   TherAct:  [facility] PT Assessment - 09/05/22 0001       Standardized Balance Assessment   Standardized Balance Assessment Berg Balance Test      Berg Balance Test   Sit to Stand Able to stand without using hands and stabilize independently    Standing Unsupported Able to stand safely 2 minutes    Sitting with Back Unsupported but Feet Supported on Floor or Stool Able to sit safely and securely 2 minutes    Stand to Sit Sits safely with minimal use of hands    Transfers Able to transfer safely, definite need of hands    Standing Unsupported with Eyes Closed Able to stand 10 seconds safely    Standing Unsupported with Feet Together Able to place feet together independently and stand 1 minute safely    From Standing, Reach Forward with Outstretched Arm Can reach forward >5 cm safely (2")    From Standing Position, Pick up Object from Floor Able to pick up shoe, needs supervision    From Standing Position, Turn to Look Behind Over each Shoulder Turn sideways only but maintains balance    Turn 360 Degrees Able to turn 360 degrees safely but slowly    Standing Unsupported, Alternately Place Feet on Step/Stool Able to complete 4 steps without aid or supervision    Standing Unsupported, One Foot in Front Able to take small step independently and hold 30 seconds    Standing on One Leg  Tries to lift leg/unable to hold 3 seconds but remains standing independently    Total Score 41            RAMP:  Level of Assistance: CGA Assistive device utilized: Single point cane Ramp Comments: Appropriate pacing, no major signs of imbalance though slightly increased pace on ascent  CURB:  Level of Assistance:  CGA Assistive device utilized: Single point cane Curb Comments: Pauses before ascent, no signs of imbalance in today's session but reports challenges with backwards LOB in community  Self Care:  Discussed use of Android app or Fit bit to track steps in order to set step goal. Patient and caregiver were interested so provided name of possible options and they are planning on following up with granddaughter to explore setting up. Also discussed option of doing time goal but seemed more interested in doing step goal at this time. Will plan to follow up next visit.   PATIENT EDUCATION: Education details: Sharlene Motts results, self care as noted above, continue tracking BP at home Person educated: Patient and Caregiver fiance Education method: Explanation Education comprehension: verbalized understanding and needs further education  HOME EXERCISE PROGRAM:  HEP provided at D/C at last PT visit; recommend to continue at this time:   Access Code: [email or web address] URL: [email or web address] Date: 07/18/2022 Prepared by: Camille Bal   Exercises - Corner Balance Feet Together With Eyes Closed  - 1 x daily - 5 x weekly - 1 sets - 2-3 reps - 30-45 seconds hold - Supine Bridge  - 1 x daily - 4-5 x weekly - 2 sets - 10 reps - Sit to Stand with Arms Crossed  - 1 x daily - 5 x weekly - 1-2 sets - 8 reps - Lower Trunk Rotations  - 1 x daily - 4 x weekly - 2 sets - 15-20 reps - Seated Hamstring Stretch  - 1 x daily - 5 x weekly - 1 sets - 2-3 reps - 45 seconds hold     GOALS: Goals reviewed with patient? Yes  SHORT TERM GOALS: Target date: 09/26/2022  Patient will demonstrate 100% compliance with initial HEP to continue to progress between physical therapy sessions.   Baseline: Continue with HEP provided at last PT D/C (still appropriate) Goal status: INITIAL  2.  Patient will improve 5x Sit to Stand score to 32" or less with minimal amount of UE use necessary to demonstrate a minimal  clinically important difference towards a decreased  risk for falls and improved lower extremity strength.   Baseline: 37.34" with mix of thigh assist and UE support, one forward LOB  Goal status: INITIAL  3.  Patient will improve TUG score to 15" or less with LRAD and no signs of imbalance to indicate a decreased risk of falls and demonstrate improved overall mobility.   Baseline: 15.23" without SPC with increased signs of instability, 19.58" with SPC (SBA)   Goal status: INITIAL  4.  Patient will improve gait speed to 0.7 m/s or greater with LRAD to indicate progress towards a decreased risk for falls.   Baseline: 0.60 m/s or 1.97 ft/sec with SPC (SBA) Goal status: INITIAL  5.  Berg balance test to be assessed/goal written as indicated Baseline: Assessed Goal status: Discontinued STG due to level of baseline score  6.  Patient will ascend/descend curb with SBA and LRAD in order to more easily access community while shopping with fiance.  Baseline: CGA with SPC Goal status: INITIAL  LONG TERM GOALS: Target date: 10/24/2022  Patient will report demonstrate independence with final HEP in order to maintain current gains and continue to progress after physical therapy discharge.   Baseline: To be provided Goal status: INITIAL  2.   Patient will improve 5x Sit to Stand score to 27" or less with minimal amount of UE use necessary to demonstrate progress towards a decreased  risk for falls and improved lower extremity strength.   Baseline: 37.34" with mix of thigh assist and UE support, one forward LOB  Goal status: INITIAL  3.  Patient will improve TUG score to 13.5" or less with LRAD and no signs of imbalance to indicate a decreased risk of falls and demonstrate improved overall mobility.   Baseline: 15.23" without SPC with increased signs of instability, 19.58 Goal status: INITIAL  4.  Patient will improve gait speed to 0.8 m/s to indicate improvement to the level of community ambulator  in order to participate more easily in activities outside of the home.   Baseline: 0.60 m/s or 1.97 ft/sec with SPC (SBA) Goal status: INITIAL  5.  Patient will improve Berg Balance score by 5 points to indicate clinically significant progress towards a decreased risk of falls and improved static stability.   Baseline: 41/56 Goal status: INITIAL  6.  Patient will ascend/descend curb with modI and LRAD in order to more easily access community while shopping with fiance.  Baseline: CGA with SPC Baseline: To be assessed Goal status: INITIAL  7.  Patient will improve FOTO score to 69 to achieve predicted improvements in functional mobility due to skilled physical therapy interventions to increase safety with and participation in daily activities. Baseline: 56 Goal status: INITIAL  ASSESSMENT:  CLINICAL IMPRESSION: Session emphasized assessment of Berg balance, curb navigation, and discussion of tracking either time or step count for walking. Patient Sharlene Motts score indicated increased risk for falls. Patient will benefit from establishing more formalized walking goal next session as well. Patient will benefit from skilled PT services to improve deficits noted above in order to more safely navigate curbs.   OBJECTIVE IMPAIRMENTS: Abnormal gait, decreased activity tolerance, decreased balance, decreased cognition, decreased endurance, decreased knowledge of condition, decreased knowledge of use of DME, decreased mobility, difficulty walking, decreased ROM, decreased strength, decreased safety awareness, impaired tone, and impaired vision/preception.   ACTIVITY LIMITATIONS: carrying, bending, standing, squatting, transfers, and locomotion level  PARTICIPATION LIMITATIONS: laundry, driving, community activity, and yard work  PERSONAL FACTORS: Age, Past/current experiences, and 3+ comorbidities: see above  are also affecting patient's functional outcome.   REHAB POTENTIAL: Good  CLINICAL DECISION  MAKING: Evolving/moderate complexity  EVALUATION COMPLEXITY: Moderate  PLAN:  PT FREQUENCY: 2x/week  PT DURATION: 8 weeks  PLANNED INTERVENTIONS: Therapeutic exercises, Therapeutic activity, Neuromuscular re-education, Balance training, Gait training, Patient/Family education, Self Care, Joint mobilization, Manual therapy, and Re-evaluation  PLAN FOR NEXT SESSION: update HEP, balance and high intensity gait training, establish walking program (either for time or with step goal)  Carmelia Bake, PT, DPT 09/05/2022, 11:37 AM

## 2022-09-06 ENCOUNTER — Encounter (HOSPITAL_BASED_OUTPATIENT_CLINIC_OR_DEPARTMENT_OTHER): Payer: Self-pay | Admitting: Family

## 2022-09-12 ENCOUNTER — Encounter: Payer: Self-pay | Admitting: Physical Therapy

## 2022-09-12 ENCOUNTER — Ambulatory Visit: Payer: Medicare Other | Admitting: Physical Therapy

## 2022-09-12 VITALS — BP 143/82 | HR 63

## 2022-09-12 DIAGNOSIS — R2681 Unsteadiness on feet: Secondary | ICD-10-CM | POA: Diagnosis not present

## 2022-09-12 DIAGNOSIS — M6281 Muscle weakness (generalized): Secondary | ICD-10-CM

## 2022-09-12 DIAGNOSIS — R2689 Other abnormalities of gait and mobility: Secondary | ICD-10-CM | POA: Diagnosis not present

## 2022-09-12 DIAGNOSIS — I639 Cerebral infarction, unspecified: Secondary | ICD-10-CM | POA: Diagnosis not present

## 2022-09-12 NOTE — Patient Instructions (Addendum)
You Can Walk For A Certain Length Of Time Each Day                          Walk 2-3 minutes or 250 steps 4-5 times per day.             Increase 1-2 minutes or 50 steps  every 7 days              Work up to 15 minutes or 700 steps (1-2 times per day).               Example:                         Day 1-2           4-5 minutes     3 times per day                         Day 7-8           10-12 minutes 2-3 times per day                         Day 13-14       20-22 minutes 1-2 times per day  Access Code: [email or web address] URL: [email or web address] Date: 09/12/2022 Prepared by: Camille Bal  Exercises - Corner Balance Feet Together With Eyes Closed  - 1 x daily - 5 x weekly - 1 sets - 2-3 reps - 30-45 seconds hold - Sit to Stand with Arms Crossed  - 1 x daily - 5 x weekly - 1-2 sets - 8 reps - Seated Hamstring Stretch  - 1 x daily - 5 x weekly - 1 sets - 2-3 reps - 45 seconds hold - Standing Tandem Balance with Counter Support  - 1 x daily - 5 x weekly - 1 sets - 2 reps - 45 seconds hold - Standing Single Leg Stance with Counter Support  - 1 x daily - 5 x weekly - 1 sets - 2 reps - 45 seconds hold

## 2022-09-12 NOTE — Therapy (Signed)
OUTPATIENT PHYSICAL THERAPY NEURO TREATMENT   Patient Name: Antonio Adams MRN: [id number] DOB:[birth year], 77 y.o., male Today's Date: 09/12/2022   PCP: Sanda Linger, MD REFERRING PROVIDER: Almon Hercules, MD  END OF SESSION:  PT End of Session - 09/12/22 1155     Visit Number 3    Number of Visits 17    Date for PT Re-Evaluation 11/07/22    Authorization Type [employer]    Progress Note Due on Visit 10    PT Start Time 1148    PT Stop Time 1228    PT Time Calculation (min) 40 min    Equipment Utilized During Treatment Gait belt    Activity Tolerance Patient tolerated treatment well    Behavior During Therapy WFL for tasks assessed/performed             Past Medical History:  Diagnosis Date   ADENOCARCINOMA, PROSTATE, GLEASON GRADE 6 12/21/2009   prostate cancer   ALLERGIC RHINITIS 03/16/2007   Allergy    Anxiety    Arthritis    back    BACK PAIN WITH RADICULOPATHY 11/23/2008   Cancer of kidney (HCC)    partial right kidney removed   CARPAL TUNNEL SYNDROME, BILATERAL 03/16/2007   Chronic back pain    Constipation    takes Senokot daily   Depression    occasionally   DIABETES MELLITUS, TYPE II 03/16/2007   takes Januvia and MEtformin daily   GASTROINTESTINAL HEMORRHAGE, HX OF 03/16/2007   GERD 03/16/2007   takes Omeprazole daily   Glaucoma    mild - no eye drops   Hemorrhoids    History of colon polyps    HYPERLIPIDEMIA 03/16/2007   takes Crestor daily   HYPERTENSION 03/16/2007   takes Diltiazem and Lisinopril daily    Kidney cancer, primary, with metastasis from kidney to other site [facility])    LEG CRAMPS 05/22/2007   Nocturia    Overactive bladder    PARESTHESIA 05/21/2007   Proctitis    PTSD (post-traumatic stress disorder)    wakes up may be dreaming of fighting   Rectal bleeding    Dr.Norins has explained its from the Radiation that he has received   Rectal bleeding    Renal cell carcinoma (HCC)    Sleep apnea    uses CPAP nightly   Stomach  cancer (HCC)    Urinary frequency    takes Toviaz daily   Past Surgical History:  Procedure Laterality Date   ADRENALECTOMY Right 02/26/2020   Procedure: OPEN RIGHT RENAL CELL METASTATECTOMY AND  ADRENALECTOMY;  Surgeon: Crist Fat, MD;  Location: [facility];  Service: Urology;  Laterality: Right;   BACK SURGERY  1983, 2008   repeat surgery; ESI '08   CARPAL TUNNEL RELEASE     bilateral   CERVICAL FUSION     COLONOSCOPY     colonosocpy     ESOPHAGOGASTRODUODENOSCOPY     ED   FLEXIBLE SIGMOIDOSCOPY N/A 01/08/2017   Procedure: FLEXIBLE SIGMOIDOSCOPY;  Surgeon: Hilarie Fredrickson, MD;  Location: [facility] ENDOSCOPY;  Service: Endoscopy;  Laterality: N/A;   HOT HEMOSTASIS N/A 01/08/2017   Procedure: HOT HEMOSTASIS (ARGON PLASMA COAGULATION/BICAP);  Surgeon: Hilarie Fredrickson, MD;  Location: [facility] ENDOSCOPY;  Service: Endoscopy;  Laterality: N/A;   IR RADIOLOGIST EVAL & MGMT  12/17/2019   IR RADIOLOGIST EVAL & MGMT  07/01/2020   IR RADIOLOGIST EVAL & MGMT  08/25/2020   LUMBAR LAMINECTOMY  10/30/2011   Procedure: MICRODISCECTOMY LUMBAR LAMINECTOMY;  Surgeon: Kerrin Champagne, MD;  Location: [facility] OR;  Service: Orthopedics;  Laterality: Right;  Right L4-5 and L5-S1 Microdiscectomy   LUMBAR LAMINECTOMY  06/17/2012   Procedure: MICRODISCECTOMY LUMBAR LAMINECTOMY;  Surgeon: Kerrin Champagne, MD;  Location: [facility] OR;  Service: Orthopedics;  Laterality: N/A;  Right L5-S1 microdiscectomy   OPEN PARTIAL HEPATECTOMY  N/A 02/26/2020   Procedure: OPEN PARTIAL HEPATECTOMY, CHOLECYSTECTOMY;  Surgeon: Almond Lint, MD;  Location: [facility];  Service: General;  Laterality: N/A;   POLYPECTOMY     PROSTATE SURGERY  march 2012   seed implant   RADIOFREQUENCY ABLATION N/A 07/21/2020   Procedure: CT MICROWAVE ABLATION;  Surgeon: Sterling Big, MD;  Location: [facility];  Service: Anesthesiology;  Laterality: N/A;   ROBOTIC ASSITED PARTIAL NEPHRECTOMY Right 09/08/2015   Procedure: XI ROBOTIC ASSITED RIGHT PARTIAL NEPHRECTOMY;  Surgeon: Crist Fat, MD;  Location: [facility];  Service: Urology;  Laterality: Right;  Clamp on: 1033 Clamp off: 1103 Total Clamp Time: 30 minutes   SHOULDER ARTHROSCOPY WITH SUBACROMIAL DECOMPRESSION Left 01/20/2016   Procedure: LEFT SHOULDER ARTHROSCOPY WITH EXTENSIVE  DEBRIDEMENT, ACROMIOPLASTY;  Surgeon: Loreta Ave, MD;  Location: [facility];  Service: Orthopedics;  Laterality: Left;   SHOULDER SURGERY  LFT   Stress Cardiolite  12/24/2003   negative for ischemia   UPPER ESOPHAGEAL ENDOSCOPIC ULTRASOUND (EUS)  04/18/2016   [facility]   UPPER GASTROINTESTINAL ENDOSCOPY     WOUND EXPLORATION     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   Patient Active Problem List   Diagnosis Date Noted   Traumatic coccydynia 08/30/2022   Acute ischemic stroke (HCC) 08/24/2022   Encounter for general adult medical examination with abnormal findings 01/23/2022   Anemia due to zinc deficiency 12/20/2021   Symptomatic anemia 12/15/2021   Cancer related pain 12/15/2021   Chronic heart failure with preserved ejection fraction (HFpEF) (HCC) 08/12/2021   Tinea cruris 08/12/2021   Anemia due to chronic kidney disease 07/20/2021   CKD (chronic kidney disease) stage 5, GFR less than 15 ml/min (HCC) 05/24/2021   BPH (benign prostatic hyperplasia) 05/24/2021   Senile nuclear sclerosis 05/20/2021   Vitamin D deficiency 05/20/2021   Vitreous degeneration, unspecified eye 05/20/2021   Intrinsic eczema 04/18/2021   Atherosclerosis of aorta (HCC) 12/30/2020   Mixed conductive and sensorineural hearing loss of left ear with restricted hearing of right ear 09/01/2020   Metastatic renal cell carcinoma (HCC) 02/26/2020   Diabetic polyneuropathy associated with type 2 diabetes mellitus (HCC) 12/18/2019   Murmur, cardiac 10/08/2018   DDD (degenerative disc disease), cervical 10/08/2018   OAB (overactive bladder) 04/04/2018   Seasonal allergic rhinitis due to pollen 11/01/2016   Post traumatic  stress disorder (PTSD) 01/29/2014   Spinal stenosis of lumbar region with radiculopathy 10/30/2011    Class: Chronic   Obstructive sleep apnea 11/24/2010   Cognitive impairment 11/24/2010   ADENOCARCINOMA, PROSTATE, GLEASON GRADE 6 12/21/2009   Essential hypertension 03/16/2007   GERD 03/16/2007    ONSET DATE: 08/25/2022 (referral)   REFERRING DIAG:  I63.9 (ICD-10-CM) - Cerebrovascular accident (CVA), unspecified mechanism    THERAPY DIAG:  Muscle weakness (generalized)  Unsteadiness on feet  Other abnormalities of gait and mobility  Rationale for Evaluation and Treatment: Rehabilitation  SUBJECTIVE:  SUBJECTIVE STATEMENT:  Patient reports he is doing well; reports that he had PCP visit and tracking elevated BP. Reports improvement since medication modification. Denies falls/near falls.   Pt accompanied by: self and significant other - fiance [name]  PERTINENT HISTORY:  Patient was discharged from this setting on 3/7 but returned to the ED on 4/11 due to acute onset of dizziness. Patient found to have an acute left parietal-occipital perventricular infarcts in region of prior infarct.   PMH: ESRD not on HD, renal cancer metastatic to liver, adrenals and lymph nodes no longer on chemo, GERD, L PCA infarct November 2023, DM2, and anemia.  PAIN:  Are you having pain? Yes: NPRS scale: 4/10 Pain location: low back Pain description: achy Aggravating factors: standing Relieving factors: sitting  PRECAUTIONS: Fall - R hemibody  WEIGHT BEARING RESTRICTIONS: No  FALLS: Has patient fallen in last 6 months? Yes. Number of falls 1 at time of initial stroke  PATIENT GOALS: "To step over curb without losing my balance."   OBJECTIVE:   DIAGNOSTIC FINDINGS:   MR BRAIN WO IMPRESSION on  08/24/2022: Acute left parieto-occipital periventricular infarcts in the region of prior infarct.  COGNITION: Overall cognitive status: Impaired   TODAY'S TREATMENT:                                                                                                                               Vitals:   09/12/22 1154  BP: (!) 143/82  Pulse: 63   TherAct: You Can Walk For A Certain Length Of Time Each Day                          Walk 2-3 minutes or 250 steps 4-5 times per day.             Increase 1-2 minutes or 50 steps  every 7 days              Work up to 15 minutes or 700 steps (1-2 times per day).               Example:                         Day 1-2           4-5 minutes     3 times per day                         Day 7-8           10-12 minutes 2-3 times per day                         Day 13-14       20-22 minutes 1-2 times per day  Review/updated HEP: -Feet together eyes closed, moderate LOB w/ independent step recovery on initial  rep, 2x30 seconds -STS w/ arms crossed x10, mild increased right knee pain.    Discussed possible benefit of neoprene sleeve to help with pain when walking a lot, but to remove it for exercises.  -Seated hamstring stretch w/ return demo to improve quality of movement 2x45 seconds each LE, RLE has more reported stretching sensation -Supine bridging x10, removed in favor of additional balance exercise -Supine lower trunk rotations x15 each side, removed  Added: -Tandem stance at counter 2x45 seconds each LE in rear progressing to unilateral UE support -SLS at counter w/ unilateral UE support 2x30 seconds each LE  PATIENT EDUCATION: Education details: Walking program - time spent explaining tracking steps and how to progress program, continue HEP w/ revisions. Person educated: Patient and Caregiver fiance Education method: Explanation Education comprehension: verbalized understanding and needs further education  HOME EXERCISE PROGRAM:  HEP  provided at D/C at last PT visit; recommend to continue at this time:   Access Code: [email or web address] URL: [email or web address] Date: 09/12/2022 Prepared by: Camille Bal  Exercises - Corner Balance Feet Together With Eyes Closed  - 1 x daily - 5 x weekly - 1 sets - 2-3 reps - 30-45 seconds hold - Sit to Stand with Arms Crossed  - 1 x daily - 5 x weekly - 1-2 sets - 8 reps - Seated Hamstring Stretch  - 1 x daily - 5 x weekly - 1 sets - 2-3 reps - 45 seconds hold - Standing Tandem Balance with Counter Support  - 1 x daily - 5 x weekly - 1 sets - 2 reps - 45 seconds hold - Standing Single Leg Stance with Counter Support  - 1 x daily - 5 x weekly - 1 sets - 2 reps - 45 seconds hold   You Can Walk For A Certain Length Of Time Each Day                          Walk 2-3 minutes or 250 steps 4-5 times per day.             Increase 1-2 minutes or 50 steps  every 7 days              Work up to 15 minutes or 700 steps (1-2 times per day).               Example:                         Day 1-2           4-5 minutes     3 times per day                         Day 7-8           10-12 minutes 2-3 times per day                         Day 13-14       20-22 minutes 1-2 times per day  GOALS: Goals reviewed with patient? Yes  SHORT TERM GOALS: Target date: 09/26/2022  Patient will demonstrate 100% compliance with initial HEP to continue to progress between physical therapy sessions.   Baseline: Continue with HEP provided at last PT D/C (still appropriate) Goal status: INITIAL  2.  Patient will improve 5x Sit to Stand score to 32" or  less with minimal amount of UE use necessary to demonstrate a minimal clinically important difference towards a decreased  risk for falls and improved lower extremity strength.   Baseline: 37.34" with mix of thigh assist and UE support, one forward LOB  Goal status: INITIAL  3.  Patient will improve TUG score to 15" or less with LRAD and no signs of  imbalance to indicate a decreased risk of falls and demonstrate improved overall mobility.   Baseline: 15.23" without SPC with increased signs of instability, 19.58" with SPC (SBA)   Goal status: INITIAL  4.  Patient will improve gait speed to 0.7 m/s or greater with LRAD to indicate progress towards a decreased risk for falls.   Baseline: 0.60 m/s or 1.97 ft/sec with SPC (SBA) Goal status: INITIAL  5.  Berg balance test to be assessed/goal written as indicated Baseline: Assessed Goal status: Discontinued STG due to level of baseline score  6.  Patient will ascend/descend curb with SBA and LRAD in order to more easily access community while shopping with fiance.  Baseline: CGA with SPC Goal status: INITIAL  LONG TERM GOALS: Target date: 10/24/2022  Patient will report demonstrate independence with final HEP in order to maintain current gains and continue to progress after physical therapy discharge.   Baseline: To be provided Goal status: INITIAL  2.   Patient will improve 5x Sit to Stand score to 27" or less with minimal amount of UE use necessary to demonstrate progress towards a decreased  risk for falls and improved lower extremity strength.   Baseline: 37.34" with mix of thigh assist and UE support, one forward LOB  Goal status: INITIAL  3.  Patient will improve TUG score to 13.5" or less with LRAD and no signs of imbalance to indicate a decreased risk of falls and demonstrate improved overall mobility.   Baseline: 15.23" without SPC with increased signs of instability, 19.58 Goal status: INITIAL  4.  Patient will improve gait speed to 0.8 m/s to indicate improvement to the level of community ambulator in order to participate more easily in activities outside of the home.   Baseline: 0.60 m/s or 1.97 ft/sec with SPC (SBA) Goal status: INITIAL  5.  Patient will improve Berg Balance score by 5 points to indicate clinically significant progress towards a decreased risk of falls and  improved static stability.   Baseline: 41/56 Goal status: INITIAL  6.  Patient will ascend/descend curb with modI and LRAD in order to more easily access community while shopping with fiance.  Baseline: CGA with SPC Baseline: To be assessed Goal status: INITIAL  7.  Patient will improve FOTO score to 69 to achieve predicted improvements in functional mobility due to skilled physical therapy interventions to increase safety with and participation in daily activities. Baseline: 56 Goal status: INITIAL  ASSESSMENT:  CLINICAL IMPRESSION: Focus of skilled session on established a walking program with a step goal.  This was the most appealing method to the patient as he has an app to track this on his phone.  Further reviewed his HEP from prior episode with modifications to incorporate more static balance tasks.  He tolerates all challenges well today and was encouraged to continue monitoring BP at home.  Will continue per POC.  OBJECTIVE IMPAIRMENTS: Abnormal gait, decreased activity tolerance, decreased balance, decreased cognition, decreased endurance, decreased knowledge of condition, decreased knowledge of use of DME, decreased mobility, difficulty walking, decreased ROM, decreased strength, decreased safety awareness, impaired tone, and impaired vision/preception.  ACTIVITY LIMITATIONS: carrying, bending, standing, squatting, transfers, and locomotion level  PARTICIPATION LIMITATIONS: laundry, driving, community activity, and yard work  PERSONAL FACTORS: Age, Past/current experiences, and 3+ comorbidities: see above  are also affecting patient's functional outcome.   REHAB POTENTIAL: Good  CLINICAL DECISION MAKING: Evolving/moderate complexity  EVALUATION COMPLEXITY: Moderate  PLAN:  PT FREQUENCY: 2x/week  PT DURATION: 8 weeks  PLANNED INTERVENTIONS: Therapeutic exercises, Therapeutic activity, Neuromuscular re-education, Balance training, Gait training, Patient/Family  education, Self Care, Joint mobilization, Manual therapy, and Re-evaluation  PLAN FOR NEXT SESSION: update HEP as needed, high intensity gait training, how is walking program and updates to HEP?, hip strengthening, dynamic balance tasks  Sadie Haber, PT, DPT 09/12/2022, 1:06 PM

## 2022-09-14 ENCOUNTER — Ambulatory Visit: Payer: Medicare Other | Attending: Internal Medicine | Admitting: Physical Therapy

## 2022-09-14 VITALS — BP 134/79 | HR 62

## 2022-09-14 DIAGNOSIS — M6281 Muscle weakness (generalized): Secondary | ICD-10-CM | POA: Insufficient documentation

## 2022-09-14 DIAGNOSIS — R2681 Unsteadiness on feet: Secondary | ICD-10-CM | POA: Diagnosis not present

## 2022-09-14 DIAGNOSIS — R278 Other lack of coordination: Secondary | ICD-10-CM | POA: Diagnosis not present

## 2022-09-14 DIAGNOSIS — R2689 Other abnormalities of gait and mobility: Secondary | ICD-10-CM | POA: Insufficient documentation

## 2022-09-14 NOTE — Therapy (Signed)
OUTPATIENT PHYSICAL THERAPY NEURO TREATMENT   Patient Name: Antonio Adams MRN: [id number] DOB:[birth year], 77 y.o., male Today's Date: 09/14/2022   PCP: Sanda Linger, MD REFERRING PROVIDER: Almon Hercules, MD  END OF SESSION:  PT End of Session - 09/14/22 1018     Visit Number 4    Number of Visits 17    Date for PT Re-Evaluation 11/07/22    Authorization Type [employer] Medicare    Progress Note Due on Visit 10    PT Start Time 1017    PT Stop Time 1100    PT Time Calculation (min) 43 min    Equipment Utilized During Treatment Gait belt    Activity Tolerance Patient tolerated treatment well    Behavior During Therapy WFL for tasks assessed/performed             Past Medical History:  Diagnosis Date   ADENOCARCINOMA, PROSTATE, GLEASON GRADE 6 12/21/2009   prostate cancer   ALLERGIC RHINITIS 03/16/2007   Allergy    Anxiety    Arthritis    back    BACK PAIN WITH RADICULOPATHY 11/23/2008   Cancer of kidney (HCC)    partial right kidney removed   CARPAL TUNNEL SYNDROME, BILATERAL 03/16/2007   Chronic back pain    Constipation    takes Senokot daily   Depression    occasionally   DIABETES MELLITUS, TYPE II 03/16/2007   takes Januvia and MEtformin daily   GASTROINTESTINAL HEMORRHAGE, HX OF 03/16/2007   GERD 03/16/2007   takes Omeprazole daily   Glaucoma    mild - no eye drops   Hemorrhoids    History of colon polyps    HYPERLIPIDEMIA 03/16/2007   takes Crestor daily   HYPERTENSION 03/16/2007   takes Diltiazem and Lisinopril daily    Kidney cancer, primary, with metastasis from kidney to other site [facility])    LEG CRAMPS 05/22/2007   Nocturia    Overactive bladder    PARESTHESIA 05/21/2007   Proctitis    PTSD (post-traumatic stress disorder)    wakes up may be dreaming of fighting   Rectal bleeding    Dr.Norins has explained its from the Radiation that he has received   Rectal bleeding    Renal cell carcinoma (HCC)    Sleep apnea    uses CPAP nightly   Stomach cancer  (HCC)    Urinary frequency    takes Toviaz daily   Past Surgical History:  Procedure Laterality Date   ADRENALECTOMY Right 02/26/2020   Procedure: OPEN RIGHT RENAL CELL METASTATECTOMY AND  ADRENALECTOMY;  Surgeon: Crist Fat, MD;  Location: [facility];  Service: Urology;  Laterality: Right;   BACK SURGERY  1983, 2008   repeat surgery; ESI '08   CARPAL TUNNEL RELEASE     bilateral   CERVICAL FUSION     COLONOSCOPY     colonosocpy     ESOPHAGOGASTRODUODENOSCOPY     ED   FLEXIBLE SIGMOIDOSCOPY N/A 01/08/2017   Procedure: FLEXIBLE SIGMOIDOSCOPY;  Surgeon: Hilarie Fredrickson, MD;  Location: [facility] ENDOSCOPY;  Service: Endoscopy;  Laterality: N/A;   HOT HEMOSTASIS N/A 01/08/2017   Procedure: HOT HEMOSTASIS (ARGON PLASMA COAGULATION/BICAP);  Surgeon: Hilarie Fredrickson, MD;  Location: [facility] ENDOSCOPY;  Service: Endoscopy;  Laterality: N/A;   IR RADIOLOGIST EVAL & MGMT  12/17/2019   IR RADIOLOGIST EVAL & MGMT  07/01/2020   IR RADIOLOGIST EVAL & MGMT  08/25/2020   LUMBAR LAMINECTOMY  10/30/2011   Procedure: MICRODISCECTOMY LUMBAR LAMINECTOMY;  Surgeon: Kerrin Champagne, MD;  Location: [facility] OR;  Service: Orthopedics;  Laterality: Right;  Right L4-5 and L5-S1 Microdiscectomy   LUMBAR LAMINECTOMY  06/17/2012   Procedure: MICRODISCECTOMY LUMBAR LAMINECTOMY;  Surgeon: Kerrin Champagne, MD;  Location: [facility] OR;  Service: Orthopedics;  Laterality: N/A;  Right L5-S1 microdiscectomy   OPEN PARTIAL HEPATECTOMY  N/A 02/26/2020   Procedure: OPEN PARTIAL HEPATECTOMY, CHOLECYSTECTOMY;  Surgeon: Almond Lint, MD;  Location: [facility];  Service: General;  Laterality: N/A;   POLYPECTOMY     PROSTATE SURGERY  march 2012   seed implant   RADIOFREQUENCY ABLATION N/A 07/21/2020   Procedure: CT MICROWAVE ABLATION;  Surgeon: Sterling Big, MD;  Location: [facility];  Service: Anesthesiology;  Laterality: N/A;   ROBOTIC ASSITED PARTIAL NEPHRECTOMY Right 09/08/2015   Procedure: XI ROBOTIC ASSITED RIGHT PARTIAL NEPHRECTOMY;  Surgeon: Crist Fat, MD;  Location: [facility];  Service: Urology;  Laterality: Right;  Clamp on: 1033 Clamp off: 1103 Total Clamp Time: 30 minutes   SHOULDER ARTHROSCOPY WITH SUBACROMIAL DECOMPRESSION Left 01/20/2016   Procedure: LEFT SHOULDER ARTHROSCOPY WITH EXTENSIVE  DEBRIDEMENT, ACROMIOPLASTY;  Surgeon: Loreta Ave, MD;  Location: [facility];  Service: Orthopedics;  Laterality: Left;   SHOULDER SURGERY  LFT   Stress Cardiolite  12/24/2003   negative for ischemia   UPPER ESOPHAGEAL ENDOSCOPIC ULTRASOUND (EUS)  04/18/2016   [facility]   UPPER GASTROINTESTINAL ENDOSCOPY     WOUND EXPLORATION     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   Patient Active Problem List   Diagnosis Date Noted   Traumatic coccydynia 08/30/2022   Acute ischemic stroke (HCC) 08/24/2022   Encounter for general adult medical examination with abnormal findings 01/23/2022   Anemia due to zinc deficiency 12/20/2021   Symptomatic anemia 12/15/2021   Cancer related pain 12/15/2021   Chronic heart failure with preserved ejection fraction (HFpEF) (HCC) 08/12/2021   Tinea cruris 08/12/2021   Anemia due to chronic kidney disease 07/20/2021   CKD (chronic kidney disease) stage 5, GFR less than 15 ml/min (HCC) 05/24/2021   BPH (benign prostatic hyperplasia) 05/24/2021   Senile nuclear sclerosis 05/20/2021   Vitamin D deficiency 05/20/2021   Vitreous degeneration, unspecified eye 05/20/2021   Intrinsic eczema 04/18/2021   Atherosclerosis of aorta (HCC) 12/30/2020   Mixed conductive and sensorineural hearing loss of left ear with restricted hearing of right ear 09/01/2020   Metastatic renal cell carcinoma (HCC) 02/26/2020   Diabetic polyneuropathy associated with type 2 diabetes mellitus (HCC) 12/18/2019   Murmur, cardiac 10/08/2018   DDD (degenerative disc disease), cervical 10/08/2018   OAB (overactive bladder) 04/04/2018   Seasonal allergic rhinitis due to pollen 11/01/2016   Post traumatic  stress disorder (PTSD) 01/29/2014   Spinal stenosis of lumbar region with radiculopathy 10/30/2011    Class: Chronic   Obstructive sleep apnea 11/24/2010   Cognitive impairment 11/24/2010   ADENOCARCINOMA, PROSTATE, GLEASON GRADE 6 12/21/2009   Essential hypertension 03/16/2007   GERD 03/16/2007    ONSET DATE: 08/25/2022 (referral)   REFERRING DIAG:  I63.9 (ICD-10-CM) - Cerebrovascular accident (CVA), unspecified mechanism    THERAPY DIAG:  Muscle weakness (generalized)  Unsteadiness on feet  Other abnormalities of gait and mobility  Rationale for Evaluation and Treatment: Rehabilitation  SUBJECTIVE:  SUBJECTIVE STATEMENT:  Patient reports he is doing good. Patient started app on his phone but is not sure how to use it. Denies falls/near falls.   Pt accompanied by: self and significant other - fiance [name]  PERTINENT HISTORY:  Patient was discharged from this setting on 3/7 but returned to the ED on 4/11 due to acute onset of dizziness. Patient found to have an acute left parietal-occipital perventricular infarcts in region of prior infarct.   PMH: ESRD not on HD, renal cancer metastatic to liver, adrenals and lymph nodes no longer on chemo, GERD, L PCA infarct November 2023, DM2, and anemia.  PAIN:  Are you having pain? Yes: NPRS scale: 4/10 Pain location: low back Pain description: achy Aggravating factors: standing Relieving factors: sitting  PRECAUTIONS: Fall - R hemibody  WEIGHT BEARING RESTRICTIONS: No  FALLS: Has patient fallen in last 6 months? Yes. Number of falls 1 at time of initial stroke  PATIENT GOALS: "To step over curb without losing my balance."   OBJECTIVE:   DIAGNOSTIC FINDINGS:   MR BRAIN WO IMPRESSION on 08/24/2022: Acute left parieto-occipital  periventricular infarcts in the region of prior infarct.  COGNITION: Overall cognitive status: Impaired   TODAY'S TREATMENT:                                                                                                                               Vitals:   09/14/22 1028  BP: 134/79  Pulse: 62    TherAct:  Education on how to use [employer] App for tracking steps with patient/caregiver. Both are receptive to use.  Patient arrives to session greatest number steps since last Sunday was slighly over 1700 steps. Current steps at start of session 380.  with SPC mix of SBA-CGA: 575 feet (510 steps) with 1 standing break, 5/10 RPE Educated on how to track step with target goal of increasing step count to 2000 steps or more a day using app and walking goal. Patient and patient caregiver collaborated to discuss goal. Educated that may need to modify goal as patient has not been walking this much and will have to assess tolerance given knee/back pain. Sit to stands to fatigue tolerance, limited by R knee pain Set 1: 8 sit to stands (CGA) Set 2: 5 sit to stands (CGA)  CURB:  Level of Assistance: CGA Assistive device utilized: Single point cane Curb Comments: Step up with L and down with R; needs min-mod verbal cues for visual scanning for safety, no major LOB noted performed x 2   Gait: GAIT: Gait pattern: step through pattern, decreased stride length, trendelenburg, trunk flexed, wide BOS, poor foot clearance- Right, and poor foot clearance- Left Distance walked: 600 feet Assistive device utilized: Single point cane Level of assistance: CGA and Min A Comments: Ambulated overground up and down sidewalk ramps, transitioned onto grass, patient required minA on grass to prevent fall  Step tracking with [employer] App: End  of session step count: 1333 steps Total steps for session: 953 steps   PATIENT EDUCATION: Education details: App use and goal target, continue  HEP Person educated: Patient and Caregiver fiance Education method: Explanation Education comprehension: verbalized understanding and needs further education  HOME EXERCISE PROGRAM:  HEP provided at D/C at last PT visit; recommend to continue at this time:   Access Code: [email or web address] URL: [email or web address] Date: 09/12/2022 Prepared by: Camille Bal  Exercises - Corner Balance Feet Together With Eyes Closed  - 1 x daily - 5 x weekly - 1 sets - 2-3 reps - 30-45 seconds hold - Sit to Stand with Arms Crossed  - 1 x daily - 5 x weekly - 1-2 sets - 8 reps - Seated Hamstring Stretch  - 1 x daily - 5 x weekly - 1 sets - 2-3 reps - 45 seconds hold - Standing Tandem Balance with Counter Support  - 1 x daily - 5 x weekly - 1 sets - 2 reps - 45 seconds hold - Standing Single Leg Stance with Counter Support  - 1 x daily - 5 x weekly - 1 sets - 2 reps - 45 seconds hold   You Can Walk For A Certain Length Of Time Each Day                          Walk 2-3 minutes or 250 steps 4-5 times per day.             Increase 1-2 minutes or 50 steps  every 7 days              Work up to 15 minutes or 700 steps (1-2 times per day).               Example:                         Day 1-2           4-5 minutes     3 times per day                         Day 7-8           10-12 minutes 2-3 times per day                         Day 13-14       20-22 minutes 1-2 times per day  GOALS: Goals reviewed with patient? Yes  SHORT TERM GOALS: Target date: 09/26/2022  Patient will demonstrate 100% compliance with initial HEP to continue to progress between physical therapy sessions.   Baseline: Continue with HEP provided at last PT D/C (still appropriate) Goal status: INITIAL  2.  Patient will improve 5x Sit to Stand score to 32" or less with minimal amount of UE use necessary to demonstrate a minimal clinically important difference towards a decreased  risk for falls and improved lower extremity  strength.   Baseline: 37.34" with mix of thigh assist and UE support, one forward LOB  Goal status: INITIAL  3.  Patient will improve TUG score to 15" or less with LRAD and no signs of imbalance to indicate a decreased risk of falls and demonstrate improved overall mobility.   Baseline: 15.23" without SPC with increased signs of instability, 19.58" with SPC (SBA)   Goal status: INITIAL  4.  Patient will improve gait speed to 0.7 m/s or greater with LRAD to indicate progress towards a decreased risk for falls.   Baseline: 0.60 m/s or 1.97 ft/sec with SPC (SBA) Goal status: INITIAL  5.  Berg balance test to be assessed/goal written as indicated Baseline: Assessed Goal status: Discontinued STG due to level of baseline score  6.  Patient will ascend/descend curb with SBA and LRAD in order to more easily access community while shopping with fiance.  Baseline: CGA with SPC Goal status: INITIAL  LONG TERM GOALS: Target date: 10/24/2022  Patient will report demonstrate independence with final HEP in order to maintain current gains and continue to progress after physical therapy discharge.   Baseline: To be provided Goal status: INITIAL  2.   Patient will improve 5x Sit to Stand score to 27" or less with minimal amount of UE use necessary to demonstrate progress towards a decreased  risk for falls and improved lower extremity strength.   Baseline: 37.34" with mix of thigh assist and UE support, one forward LOB  Goal status: INITIAL  3.  Patient will improve TUG score to 13.5" or less with LRAD and no signs of imbalance to indicate a decreased risk of falls and demonstrate improved overall mobility.   Baseline: 15.23" without SPC with increased signs of instability, 19.58 Goal status: INITIAL  4.  Patient will improve gait speed to 0.8 m/s to indicate improvement to the level of community ambulator in order to participate more easily in activities outside of the home.   Baseline: 0.60 m/s or  1.97 ft/sec with SPC (SBA) Goal status: INITIAL  5.  Patient will improve Berg Balance score by 5 points to indicate clinically significant progress towards a decreased risk of falls and improved static stability.   Baseline: 41/56 Goal status: INITIAL  6.  Patient will ascend/descend curb with modI and LRAD in order to more easily access community while shopping with fiance.  Baseline: CGA with SPC Baseline: To be assessed Goal status: INITIAL  7.  Patient will improve FOTO score to 69 to achieve predicted improvements in functional mobility due to skilled physical therapy interventions to increase safety with and participation in daily activities. Baseline: 56 Goal status: INITIAL  ASSESSMENT:  CLINICAL IMPRESSION: Session emphasized education and training of app in phone for step count with functional movement and training during PT sessions. Patient required minA to navigate outdoors in grass. CGA with curb navigation but will require continued repetition. Given patient's cognition, educated caregiver on how to use app as well for step count. Patient ambulates well below age matched norm; Will continue per POC.  OBJECTIVE IMPAIRMENTS: Abnormal gait, decreased activity tolerance, decreased balance, decreased cognition, decreased endurance, decreased knowledge of condition, decreased knowledge of use of DME, decreased mobility, difficulty walking, decreased ROM, decreased strength, decreased safety awareness, impaired tone, and impaired vision/preception.   ACTIVITY LIMITATIONS: carrying, bending, standing, squatting, transfers, and locomotion level  PARTICIPATION LIMITATIONS: laundry, driving, community activity, and yard work  PERSONAL FACTORS: Age, Past/current experiences, and 3+ comorbidities: see above  are also affecting patient's functional outcome.   REHAB POTENTIAL: Good  CLINICAL DECISION MAKING: Evolving/moderate complexity  EVALUATION COMPLEXITY:  Moderate  PLAN:  PT FREQUENCY: 2x/week  PT DURATION: 8 weeks  PLANNED INTERVENTIONS: Therapeutic exercises, Therapeutic activity, Neuromuscular re-education, Balance training, Gait training, Patient/Family education, Self Care, Joint mobilization, Manual therapy, and Re-evaluation  PLAN FOR NEXT SESSION: update HEP as needed, high intensity gait training, how is walking program and updates  to HEP?, hip strengthening, dynamic balance tasks, assess step count with app and modify goal as needed  Carmelia Bake, PT, DPT 09/14/2022, 2:07 PM

## 2022-09-18 ENCOUNTER — Encounter (HOSPITAL_COMMUNITY)
Admission: RE | Admit: 2022-09-18 | Discharge: 2022-09-18 | Disposition: A | Discharge status: Home / Self Care | Payer: Medicare Other | Source: Ambulatory Visit | Attending: Nephrology | Admitting: Nephrology

## 2022-09-18 VITALS — BP 149/84 | HR 71 | Temp 97.2°F | Resp 17

## 2022-09-18 DIAGNOSIS — N185 Chronic kidney disease, stage 5: Secondary | ICD-10-CM | POA: Diagnosis not present

## 2022-09-18 MED ORDER — EPOETIN ALFA-EPBX 40000 UNIT/ML IJ SOLN
30000.0000 [IU] | INTRAMUSCULAR | Status: DC
  Administered 2022-09-18: 30000 [IU] via SUBCUTANEOUS

## 2022-09-18 MED ORDER — EPOETIN ALFA-EPBX 40000 UNIT/ML IJ SOLN
INTRAMUSCULAR | Status: AC
  Filled 2022-09-18: qty 1

## 2022-09-19 ENCOUNTER — Encounter: Payer: Self-pay | Admitting: Physician Assistant

## 2022-09-19 ENCOUNTER — Ambulatory Visit: Payer: Medicare Other | Admitting: Physical Therapy

## 2022-09-19 ENCOUNTER — Ambulatory Visit (INDEPENDENT_AMBULATORY_CARE_PROVIDER_SITE_OTHER): Payer: Medicare Other | Admitting: Physician Assistant

## 2022-09-19 ENCOUNTER — Other Ambulatory Visit (INDEPENDENT_AMBULATORY_CARE_PROVIDER_SITE_OTHER): Payer: Medicare Other

## 2022-09-19 DIAGNOSIS — M170 Bilateral primary osteoarthritis of knee: Secondary | ICD-10-CM | POA: Diagnosis not present

## 2022-09-19 DIAGNOSIS — M1712 Unilateral primary osteoarthritis, left knee: Secondary | ICD-10-CM | POA: Diagnosis not present

## 2022-09-19 DIAGNOSIS — M1711 Unilateral primary osteoarthritis, right knee: Secondary | ICD-10-CM

## 2022-09-19 LAB — POCT HEMOGLOBIN-HEMACUE: Hemoglobin: 10.1 g/dL — ABNORMAL LOW (ref 13.0–17.0)

## 2022-09-19 MED ORDER — BUPIVACAINE HCL 0.25 % IJ SOLN
2.0000 mL | INTRAMUSCULAR | Status: AC | PRN
Start: 2022-09-19 — End: 2022-09-19
  Administered 2022-09-19: 2 mL via INTRA_ARTICULAR

## 2022-09-19 MED ORDER — TRAMADOL HCL 50 MG PO TABS: 50.0000 mg | ORAL_TABLET | Freq: Two times a day (BID) | ORAL | 2 refills | Status: DC | PRN

## 2022-09-19 MED ORDER — LIDOCAINE HCL 1 % IJ SOLN
2.0000 mL | INTRAMUSCULAR | Status: AC | PRN
Start: 2022-09-19 — End: 2022-09-19
  Administered 2022-09-19: 2 mL

## 2022-09-19 MED ORDER — METHYLPREDNISOLONE ACETATE 40 MG/ML IJ SUSP
40.0000 mg | INTRAMUSCULAR | Status: AC | PRN
Start: 2022-09-19 — End: 2022-09-19
  Administered 2022-09-19: 40 mg via INTRA_ARTICULAR

## 2022-09-19 NOTE — Progress Notes (Signed)
Office Visit Note   Patient: Antonio Adams           Date of Birth: 02/10/1946           MRN: [id number] Visit Date: 09/19/2022              Requested by: Etta Grandchild, MD [address] [place],  [place] [address] PCP: Etta Grandchild, MD   Assessment & Plan: Visit Diagnoses:  1. Primary osteoarthritis of both knees     Plan: Impression is bilateral knee osteoarthritis right greater than left.  Today, we discussed various treatment options to include cortisone injection versus viscosupplementation injection versus total knee arthroplasty.  He would like to proceed with right knee cortisone injection today.  If his symptoms do not significantly improve he will let us know we will get approval for viscosupplementation injection.  In regards to the left knee, he is a diabetic so I do not want inject this with cortisone today.  If he wishes, he may return in the next 1 to 2 weeks for cortisone injection.  Call with concerns or questions.  Follow-Up Instructions: Return if symptoms worsen or fail to improve.   Orders:  Orders Placed This Encounter  Procedures   Large Joint Inj: R knee   XR KNEE 3 VIEW LEFT   XR KNEE 3 VIEW RIGHT   Meds ordered this encounter  Medications   traMADol (ULTRAM) 50 MG tablet    Sig: Take 1 tablet (50 mg total) by mouth every 12 (twelve) hours as needed.    Dispense:  30 tablet    Refill:  2      Procedures: Large Joint Inj: R knee on 09/19/2022 2:38 PM Indications: pain Details: 22 G needle, anterolateral approach Medications: 2 mL lidocaine 1 %; 2 mL bupivacaine 0.25 %; 40 mg methylPREDNISolone acetate 40 MG/ML      Clinical Data: No additional findings.   Subjective: Chief Complaint  Patient presents with   Right Knee - Pain   Left Knee - Pain    HPI patient is a pleasant 77 year old gentleman who comes in today with bilateral knee pain right greater than left for the past several years.  He notes the right knee has  worsened recently.  He denies any injury or change in activity.  The pain is to the entire knee and is described as a constant ache.  No pain at night.  He has tried Tylenol without relief.  Cortisone injection a few years ago did seem to help.  He is unsure whether he is undergone gel injections.  Review of Systems as detailed in HPI.  All others reviewed and are negative.   Objective: Vital Signs: There were no vitals taken for this visit.  Physical Exam well-developed well-nourished gentleman in no acute distress.  Alert and oriented x 3.  Ortho Exam bilateral knee exam shows no effusion.  Range of motion 10 to 100 degrees.  No joint line tenderness.  Ligaments are stable.  He is neurovascularly intact distally.  Specialty Comments:  No specialty comments available.  Imaging: XR KNEE 3 VIEW LEFT  Result Date: 09/19/2022 X-rays demonstrate mild to moderate degenerative changes the medial compartment.  Moderate degenerative changes the patellofemoral compartment.  XR KNEE 3 VIEW RIGHT  Result Date: 09/19/2022 Moderate degenerative changes the medial and patellofemoral compartments    PMFS History: Patient Active Problem List   Diagnosis Date Noted   Traumatic coccydynia 08/30/2022   Acute ischemic stroke (  HCC) 08/24/2022   Encounter for general adult medical examination with abnormal findings 01/23/2022   Anemia due to zinc deficiency 12/20/2021   Symptomatic anemia 12/15/2021   Cancer related pain 12/15/2021   Chronic heart failure with preserved ejection fraction (HFpEF) (HCC) 08/12/2021   Tinea cruris 08/12/2021   Anemia due to chronic kidney disease 07/20/2021   CKD (chronic kidney disease) stage 5, GFR less than 15 ml/min (HCC) 05/24/2021   BPH (benign prostatic hyperplasia) 05/24/2021   Senile nuclear sclerosis 05/20/2021   Vitamin D deficiency 05/20/2021   Vitreous degeneration, unspecified eye 05/20/2021   Intrinsic eczema 04/18/2021   Atherosclerosis of aorta (HCC)  12/30/2020   Mixed conductive and sensorineural hearing loss of left ear with restricted hearing of right ear 09/01/2020   Metastatic renal cell carcinoma (HCC) 02/26/2020   Diabetic polyneuropathy associated with type 2 diabetes mellitus (HCC) 12/18/2019   Murmur, cardiac 10/08/2018   DDD (degenerative disc disease), cervical 10/08/2018   OAB (overactive bladder) 04/04/2018   Seasonal allergic rhinitis due to pollen 11/01/2016   Post traumatic stress disorder (PTSD) 01/29/2014   Spinal stenosis of lumbar region with radiculopathy 10/30/2011    Class: Chronic   Obstructive sleep apnea 11/24/2010   Cognitive impairment 11/24/2010   ADENOCARCINOMA, PROSTATE, GLEASON GRADE 6 12/21/2009   Essential hypertension 03/16/2007   GERD 03/16/2007   Past Medical History:  Diagnosis Date   ADENOCARCINOMA, PROSTATE, GLEASON GRADE 6 12/21/2009   prostate cancer   ALLERGIC RHINITIS 03/16/2007   Allergy    Anxiety    Arthritis    back    BACK PAIN WITH RADICULOPATHY 11/23/2008   Cancer of kidney (HCC)    partial right kidney removed   CARPAL TUNNEL SYNDROME, BILATERAL 03/16/2007   Chronic back pain    Constipation    takes Senokot daily   Depression    occasionally   DIABETES MELLITUS, TYPE II 03/16/2007   takes Januvia and MEtformin daily   GASTROINTESTINAL HEMORRHAGE, HX OF 03/16/2007   GERD 03/16/2007   takes Omeprazole daily   Glaucoma    mild - no eye drops   Hemorrhoids    History of colon polyps    HYPERLIPIDEMIA 03/16/2007   takes Crestor daily   HYPERTENSION 03/16/2007   takes Diltiazem and Lisinopril daily    Kidney cancer, primary, with metastasis from kidney to other site [facility])    LEG CRAMPS 05/22/2007   Nocturia    Overactive bladder    PARESTHESIA 05/21/2007   Proctitis    PTSD (post-traumatic stress disorder)    wakes up may be dreaming of fighting   Rectal bleeding    Dr.Norins has explained its from the Radiation that he has received   Rectal bleeding    Renal cell  carcinoma (HCC)    Sleep apnea    uses CPAP nightly   Stomach cancer (HCC)    Urinary frequency    takes Toviaz daily    Family History  Problem Relation Age of Onset   Cancer Sister 30       Cancer, unsure type   Hypertension Other    Colon cancer Neg Hx    Esophageal cancer Neg Hx    Stomach cancer Neg Hx    Diabetes Neg Hx    Colon polyps Neg Hx    Rectal cancer Neg Hx     Past Surgical History:  Procedure Laterality Date   ADRENALECTOMY Right 02/26/2020   Procedure: OPEN RIGHT RENAL CELL METASTATECTOMY AND  ADRENALECTOMY;  Surgeon: Crist Fat, MD;  Location: [facility];  Service: Urology;  Laterality: Right;   BACK SURGERY  1983, 2008   repeat surgery; [facility]SI '08   CARPAL TUNNEL RELEASE     bilateral   CERVICAL FUSION     COLONOSCOPY     colonosocpy     ESOPHAGOGASTRODUODENOSCOPY     ED   FLEXIBLE SIGMOIDOSCOPY N/A 01/08/2017   Procedure: FLEXIBLE SIGMOIDOSCOPY;  Surgeon: Hilarie Fredrickson, MD;  Location[facility] END[facility];  Service: Endoscopy;  Laterality: N/A;   HOT HEMOSTASIS N/A 01/08/2017   Procedure: HOT HEMOSTASIS (ARGON PLASMA COAGULATION/BICAP);  Surgeon: Hilarie Fredrickson, MD;  Location: [facility] ENDOSCOPY;  Service: Endoscopy;  Laterality: N/A;   IR RADIOLOGIST EVAL & MGMT  12/17/2019   IR RADIOLOGIST EVAL & MGMT  07/01/2020   IR RADIOLOGIST EVAL & MGMT  08/25/2020   LUMBAR LAMINECTOMY  10/30/2011   Procedure: MICRODISCECTOMY LUMBAR LAMINECTOMY;  Surgeon: Kerrin Champagne, MD;  Location: [facility] OR;  Service: Orthopedics;  Laterality: Right;  Right L4-5 and L5-S1 Microdiscectomy   LUMBAR LAMINECTOMY  06/17/2012   Procedure: MICRODISCECTOMY LUMBAR LAMINECTOMY;  Surgeon: Kerrin Champagne, MD;  Location: [facility];  Service: Orthopedics;  Laterality: N/A;  Right L5-S1 microdiscectomy   OPEN PARTIAL HEPATECTOMY  N/A 02/26/2020   Procedure: OPEN PARTIAL HEPATECTOMY, CHOLECYSTECTOMY;  Surgeon: Almond Lint, MD;  Location: [facility];  Service: General;  Laterality: N/A;   POLYPECTOMY     PROSTATE SURGERY   march 2012   seed implant   RADIOFREQUENCY ABLATION N/A 07/21/2020   Procedure: CT MICROWAVE ABLATION;  Surgeon: Sterling Big, MD;  Location: [facility];  Service: Anesthesiology;  Laterality: N/A;   ROBOTIC ASSITED PARTIAL NEPHRECTOMY Right 09/08/2015   Procedure: XI ROBOTIC ASSITED RIGHT PARTIAL NEPHRECTOMY;  Surgeon: Crist Fat, MD;  Location: [facility];  Service: Urology;  Laterality: Right;  Clamp on: 1033 Clamp off: 1103 Total Clamp Time: 30 minutes   SHOULDER ARTHROSCOPY WITH SUBACROMIAL DECOMPRESSION Left 01/20/2016   Procedure: LEFT SHOULDER ARTHROSCOPY WITH EXTENSIVE  DEBRIDEMENT, ACROMIOPLASTY;  Surgeon: Loreta Ave, MD;  Location: [facility];  Service: Orthopedics;  Laterality: Left;   SHOULDER SURGERY  LFT   Stress Cardiolite  12/24/2003   negative for ischemia   UPPER ESOPHAGEAL ENDOSCOPIC ULTRASOUND (EUS)  04/18/2016   [facility]   UPPER GASTROINTESTINAL ENDOSCOPY     WOUND EXPLORATION     management of a wound that he sustained in the [employer]   Social History   Occupational History   Occupation: Patent examiner; Chief Strategy Officer: RETIRED    Comment: retired from Patent examiner  Tobacco Use   Smoking status: Never   Smokeless tobacco: Never  Vaping Use   Vaping Use: Never used  Substance and Sexual Activity   Alcohol use: Not Currently    Alcohol/week: 0.0 standard drinks of alcohol   Drug use: No   Sexual activity: Not Currently    Partners: Female

## 2022-09-21 ENCOUNTER — Encounter: Payer: Self-pay | Admitting: Physical Therapy

## 2022-09-21 ENCOUNTER — Ambulatory Visit: Payer: Medicare Other | Admitting: Physical Therapy

## 2022-09-21 VITALS — BP 176/95 | HR 70

## 2022-09-21 DIAGNOSIS — R2681 Unsteadiness on feet: Secondary | ICD-10-CM | POA: Diagnosis not present

## 2022-09-21 DIAGNOSIS — M6281 Muscle weakness (generalized): Secondary | ICD-10-CM

## 2022-09-21 DIAGNOSIS — R2689 Other abnormalities of gait and mobility: Secondary | ICD-10-CM

## 2022-09-21 DIAGNOSIS — R278 Other lack of coordination: Secondary | ICD-10-CM | POA: Diagnosis not present

## 2022-09-21 NOTE — Therapy (Signed)
OUTPATIENT PHYSICAL THERAPY NEURO TREATMENT   Patient Name: Antonio Adams MRN: [id number] DOB:January 31, 1946, 77 y.o., male Today's Date: 09/21/2022   PCP: Sanda Linger, MD REFERRING PROVIDER: Almon Hercules, MD  END OF SESSION:  PT End of Session - 09/21/22 0941     Visit Number 5    Number of Visits 17    Date for PT Re-Evaluation 11/07/22    Authorization Type [employer] Medicare    Progress Note Due on Visit 10    PT Start Time [phone]   pt received from restroom following check-in   PT Stop Time 1016    PT Time Calculation (min) 35 min    Equipment Utilized During Treatment Gait belt    Activity Tolerance Patient tolerated treatment well    Behavior During Therapy WFL for tasks assessed/performed             Past Medical History:  Diagnosis Date   ADENOCARCINOMA, PROSTATE, GLEASON GRADE 6 12/21/2009   prostate cancer   ALLERGIC RHINITIS 03/16/2007   Allergy    Anxiety    Arthritis    back    BACK PAIN WITH RADICULOPATHY 11/23/2008   Cancer of kidney (HCC)    partial right kidney removed   CARPAL TUNNEL SYNDROME, BILATERAL 03/16/2007   Chronic back pain    Constipation    takes Senokot daily   Depression    occasionally   DIABETES MELLITUS, TYPE II 03/16/2007   takes Januvia and MEtformin daily   GASTROINTESTINAL HEMORRHAGE, HX OF 03/16/2007   GERD 03/16/2007   takes Omeprazole daily   Glaucoma    mild - no eye drops   Hemorrhoids    History of colon polyps    HYPERLIPIDEMIA 03/16/2007   takes Crestor daily   HYPERTENSION 03/16/2007   takes Diltiazem and Lisinopril daily    Kidney cancer, primary, with metastasis from kidney to other site [facility])    LEG CRAMPS 05/22/2007   Nocturia    Overactive bladder    PARESTHESIA 05/21/2007   Proctitis    PTSD (post-traumatic stress disorder)    wakes up may be dreaming of fighting   Rectal bleeding    Dr.Norins has explained its from the Radiation that he has received   Rectal bleeding    Renal cell carcinoma (HCC)    Sleep  apnea    uses CPAP nightly   Stomach cancer (HCC)    Urinary frequency    takes Toviaz daily   Past Surgical History:  Procedure Laterality Date   ADRENALECTOMY Right 02/26/2020   Procedure: OPEN RIGHT RENAL CELL METASTATECTOMY AND  ADRENALECTOMY;  Surgeon: Crist Fat, MD;  Location: [facility];  Service: Urology;  Laterality: Right;   BACK SURGERY  1983, 2008   repeat surgery; ESI '08   CARPAL TUNNEL RELEASE     bilateral   CERVICAL FUSION     COLONOSCOPY     colonosocpy     ESOPHAGOGASTRODUODENOSCOPY     ED   FLEXIBLE SIGMOIDOSCOPY N/A 01/08/2017   Procedure: FLEXIBLE SIGMOIDOSCOPY;  Surgeon: Hilarie Fredrickson, MD;  Location: [facility] ENDOSCOPY;  Service: Endoscopy;  Laterality: N/A;   HOT HEMOSTASIS N/A 01/08/2017   Procedure: HOT HEMOSTASIS (ARGON PLASMA COAGULATION/BICAP);  Surgeon: Hilarie Fredrickson, MD;  Location: [facility] ENDOSCOPY;  Service: Endoscopy;  Laterality: N/A;   IR RADIOLOGIST EVAL & MGMT  12/17/2019   IR RADIOLOGIST EVAL & MGMT  07/01/2020   IR RADIOLOGIST EVAL & MGMT  08/25/2020   LUMBAR LAMINECTOMY  10/30/2011   Procedure: MICRODISCECTOMY LUMBAR LAMINECTOMY;  Surgeon: Kerrin Champagne, MD;  Location: [facility] OR;  Service: Orthopedics;  Laterality: Right;  Right L4-5 and L5-S1 Microdiscectomy   LUMBAR LAMINECTOMY  06/17/2012   Procedure: MICRODISCECTOMY LUMBAR LAMINECTOMY;  Surgeon: Kerrin Champagne, MD;  Location: [facility] OR;  Service: Orthopedics;  Laterality: N/A;  Right L5-S1 microdiscectomy   OPEN PARTIAL HEPATECTOMY  N/A 02/26/2020   Procedure: OPEN PARTIAL HEPATECTOMY, CHOLECYSTECTOMY;  Surgeon: Almond Lint, MD;  Location: [facility];  Service: General;  Laterality: N/A;   POLYPECTOMY     PROSTATE SURGERY  march 2012   seed implant   RADIOFREQUENCY ABLATION N/A 07/21/2020   Procedure: CT MICROWAVE ABLATION;  Surgeon: Sterling Big, MD;  Location: [facility];  Service: Anesthesiology;  Laterality: N/A;   ROBOTIC ASSITED PARTIAL NEPHRECTOMY Right 09/08/2015   Procedure: XI ROBOTIC ASSITED RIGHT  PARTIAL NEPHRECTOMY;  Surgeon: Crist Fat, MD;  Location: [facility];  Service: Urology;  Laterality: Right;  Clamp on: 1033 Clamp off: 1103 Total Clamp Time: 30 minutes   SHOULDER ARTHROSCOPY WITH SUBACROMIAL DECOMPRESSION Left 01/20/2016   Procedure: LEFT SHOULDER ARTHROSCOPY WITH EXTENSIVE  DEBRIDEMENT, ACROMIOPLASTY;  Surgeon: Loreta Ave, MD;  Location: [facility];  Service: Orthopedics;  Laterality: Left;   SHOULDER SURGERY  LFT   Stress Cardiolite  12/24/2003   negative for ischemia   UPPER ESOPHAGEAL ENDOSCOPIC ULTRASOUND (EUS)  04/18/2016   [facility]   UPPER GASTROINTESTINAL ENDOSCOPY     WOUND EXPLORATION     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   Patient Active Problem List   Diagnosis Date Noted   Traumatic coccydynia 08/30/2022   Acute ischemic stroke (HCC) 08/24/2022   Encounter for general adult medical examination with abnormal findings 01/23/2022   Anemia due to zinc deficiency 12/20/2021   Symptomatic anemia 12/15/2021   Cancer related pain 12/15/2021   Chronic heart failure with preserved ejection fraction (HFpEF) (HCC) 08/12/2021   Tinea cruris 08/12/2021   Anemia due to chronic kidney disease 07/20/2021   CKD (chronic kidney disease) stage 5, GFR less than 15 ml/min (HCC) 05/24/2021   BPH (benign prostatic hyperplasia) 05/24/2021   Senile nuclear sclerosis 05/20/2021   Vitamin D deficiency 05/20/2021   Vitreous degeneration, unspecified eye 05/20/2021   Intrinsic eczema 04/18/2021   Atherosclerosis of aorta (HCC) 12/30/2020   Mixed conductive and sensorineural hearing loss of left ear with restricted hearing of right ear 09/01/2020   Metastatic renal cell carcinoma (HCC) 02/26/2020   Diabetic polyneuropathy associated with type 2 diabetes mellitus (HCC) 12/18/2019   Murmur, cardiac 10/08/2018   DDD (degenerative disc disease), cervical 10/08/2018   OAB (overactive bladder) 04/04/2018   Seasonal allergic rhinitis due  to pollen 11/01/2016   Post traumatic stress disorder (PTSD) 01/29/2014   Spinal stenosis of lumbar region with radiculopathy 10/30/2011    Class: Chronic   Obstructive sleep apnea 11/24/2010   Cognitive impairment 11/24/2010   ADENOCARCINOMA, PROSTATE, GLEASON GRADE 6 12/21/2009   Essential hypertension 03/16/2007   GERD 03/16/2007    ONSET DATE: 08/25/2022 (referral)   REFERRING DIAG:  I63.9 (ICD-10-CM) - Cerebrovascular accident (CVA), unspecified mechanism    THERAPY DIAG:  Muscle weakness (generalized)  Unsteadiness on feet  Other abnormalities of gait and mobility  Rationale for Evaluation and Treatment: Rehabilitation  SUBJECTIVE:  SUBJECTIVE STATEMENT:  Patient reports he is doing good. He just has a shot for his knee on Monday and is starting to feel better.  Partner states she thought he took his medicines after he ate this morning, but he did not and requests water for him to do this at onset of session following BP reading.  She states his BP was high this morning as well.   Pt accompanied by: self and significant other - fiance [name]  PERTINENT HISTORY:  Patient was discharged from this setting on 3/7 but returned to the ED on 4/11 due to acute onset of dizziness. Patient found to have an acute left parietal-occipital perventricular infarcts in region of prior infarct.   PMH: ESRD not on HD, renal cancer metastatic to liver, adrenals and lymph nodes no longer on chemo, GERD, L PCA infarct November 2023, DM2, and anemia.  PAIN:  Are you having pain? Yes: NPRS scale: 8-9/10 Pain location: right knee Pain description: sharp, achy Aggravating factors: moving Relieving factors: steroid shot  PRECAUTIONS: Fall - R hemibody  WEIGHT BEARING RESTRICTIONS: No  FALLS: Has patient  fallen in last 6 months? Yes. Number of falls 1 at time of initial stroke  PATIENT GOALS: "To step over curb without losing my balance."   OBJECTIVE:   DIAGNOSTIC FINDINGS:   MR BRAIN WO IMPRESSION on 08/24/2022: Acute left parieto-occipital periventricular infarcts in the region of prior infarct.  COGNITION: Overall cognitive status: Impaired   TODAY'S TREATMENT:                                                                                                                               Vitals:   09/21/22 0948  BP: (!) 176/95  Pulse: 70  TherAct:  Education on increasing steps using [employer] App with patient/caregiver.  Patient arrives to session greatest number steps since last Sunday was 2,394 steps. Current steps at start of session 392.  Lowest step count 717 (only 2 days crossing 1,000). Modified target goal of increasing step count to 1000 steps vs prior 2,000 using app and walking goal due to difficulty maintaining consistency w/ 2,000 step goal.   Further education on medication management, needing to take medicines at least 1 hour prior to therapy due to BP limiting progression.  Re-iterated strategies like using alarms and signs in home with pt and partner stating they have tried these and they have not helped.  STS from sitting on airex 2x6 no UE support Sitting unsupported on airex marching 2x20 Seated hip abduction 2x15 w/ blue theraband above knees Seated scapular retractions x15, cues for form using blue theraband  Reassessed BP at end of session following activity (LUE in sitting):  174/99, HR 71bpm  PATIENT EDUCATION: Education details: Goal target w/ app, continue HEP and walking program. Person educated: Patient and Caregiver fiance Education method: Explanation Education comprehension: verbalized understanding and needs further education  HOME EXERCISE PROGRAM:  HEP provided at  D/C at last PT visit; recommend to continue at this time:   Access  Code: [email or web address] URL: [email or web address] Date: 09/12/2022 Prepared by: Camille Bal  Exercises - Corner Balance Feet Together With Eyes Closed  - 1 x daily - 5 x weekly - 1 sets - 2-3 reps - 30-45 seconds hold - Sit to Stand with Arms Crossed  - 1 x daily - 5 x weekly - 1-2 sets - 8 reps - Seated Hamstring Stretch  - 1 x daily - 5 x weekly - 1 sets - 2-3 reps - 45 seconds hold - Standing Tandem Balance with Counter Support  - 1 x daily - 5 x weekly - 1 sets - 2 reps - 45 seconds hold - Standing Single Leg Stance with Counter Support  - 1 x daily - 5 x weekly - 1 sets - 2 reps - 45 seconds hold   You Can Walk For A Certain Length Of Time Each Day                          Walk 2-3 minutes or 250 steps 4-5 times per day.             Increase 1-2 minutes or 50 steps  every 7 days              Work up to 15 minutes or 700 steps (1-2 times per day).               Example:                         Day 1-2           4-5 minutes     3 times per day                         Day 7-8           10-12 minutes 2-3 times per day                         Day 13-14       20-22 minutes 1-2 times per day  GOALS: Goals reviewed with patient? Yes  SHORT TERM GOALS: Target date: 09/26/2022  Patient will demonstrate 100% compliance with initial HEP to continue to progress between physical therapy sessions.   Baseline: Continue with HEP provided at last PT D/C (still appropriate) Goal status: INITIAL  2.  Patient will improve 5x Sit to Stand score to 32" or less with minimal amount of UE use necessary to demonstrate a minimal clinically important difference towards a decreased  risk for falls and improved lower extremity strength.   Baseline: 37.34" with mix of thigh assist and UE support, one forward LOB  Goal status: INITIAL  3.  Patient will improve TUG score to 15" or less with LRAD and no signs of imbalance to indicate a decreased risk of falls and demonstrate improved overall  mobility.   Baseline: 15.23" without SPC with increased signs of instability, 19.58" with SPC (SBA)   Goal status: INITIAL  4.  Patient will improve gait speed to 0.7 m/s or greater with LRAD to indicate progress towards a decreased risk for falls.   Baseline: 0.60 m/s or 1.97 ft/sec with SPC (SBA) Goal status: INITIAL  5.  Berg balance test to be assessed/goal written as indicated Baseline: Assessed Goal  status: Discontinued STG due to level of baseline score  6.  Patient will ascend/descend curb with SBA and LRAD in order to more easily access community while shopping with fiance.  Baseline: CGA with SPC Goal status: INITIAL  LONG TERM GOALS: Target date: 10/24/2022  Patient will report demonstrate independence with final HEP in order to maintain current gains and continue to progress after physical therapy discharge.   Baseline: To be provided Goal status: INITIAL  2.   Patient will improve 5x Sit to Stand score to 27" or less with minimal amount of UE use necessary to demonstrate progress towards a decreased  risk for falls and improved lower extremity strength.   Baseline: 37.34" with mix of thigh assist and UE support, one forward LOB  Goal status: INITIAL  3.  Patient will improve TUG score to 13.5" or less with LRAD and no signs of imbalance to indicate a decreased risk of falls and demonstrate improved overall mobility.   Baseline: 15.23" without SPC with increased signs of instability, 19.58 Goal status: INITIAL  4.  Patient will improve gait speed to 0.8 m/s to indicate improvement to the level of community ambulator in order to participate more easily in activities outside of the home.   Baseline: 0.60 m/s or 1.97 ft/sec with SPC (SBA) Goal status: INITIAL  5.  Patient will improve Berg Balance score by 5 points to indicate clinically significant progress towards a decreased risk of falls and improved static stability.   Baseline: 41/56 Goal status: INITIAL  6.   Patient will ascend/descend curb with modI and LRAD in order to more easily access community while shopping with fiance.  Baseline: CGA with SPC Baseline: To be assessed Goal status: INITIAL  7.  Patient will improve FOTO score to 69 to achieve predicted improvements in functional mobility due to skilled physical therapy interventions to increase safety with and participation in daily activities. Baseline: 56 Goal status: INITIAL  ASSESSMENT:  CLINICAL IMPRESSION: Session somewhat limited by elevated BP just within parameters for PT as well as ongoing right knee pain.  Modified pt step goal by lowering count to 1,000 per day to make progression more attainable for pt due to recent consistent steps around 800 per day.  Limited therapeutic exercise performed in sitting for both hip stability and postural correction.  He continues to benefit from skilled PT to address ongoing functional weakness, gait deficits, and dynamic stability.  OBJECTIVE IMPAIRMENTS: Abnormal gait, decreased activity tolerance, decreased balance, decreased cognition, decreased endurance, decreased knowledge of condition, decreased knowledge of use of DME, decreased mobility, difficulty walking, decreased ROM, decreased strength, decreased safety awareness, impaired tone, and impaired vision/preception.   ACTIVITY LIMITATIONS: carrying, bending, standing, squatting, transfers, and locomotion level  PARTICIPATION LIMITATIONS: laundry, driving, community activity, and yard work  PERSONAL FACTORS: Age, Past/current experiences, and 3+ comorbidities: see above  are also affecting patient's functional outcome.   REHAB POTENTIAL: Good  CLINICAL DECISION MAKING: Evolving/moderate complexity  EVALUATION COMPLEXITY: Moderate  PLAN:  PT FREQUENCY: 2x/week  PT DURATION: 8 weeks  PLANNED INTERVENTIONS: Therapeutic exercises, Therapeutic activity, Neuromuscular re-education, Balance training, Gait training, Patient/Family  education, Self Care, Joint mobilization, Manual therapy, and Re-evaluation  PLAN FOR NEXT SESSION: update HEP as needed, high intensity gait training, how is walking program and updates to HEP?, hip strengthening, dynamic balance tasks, ASSESS STGs!  assess step count with app and modify goal as needed  Sadie Haber, PT, DPT 09/21/2022, 10:16 AM

## 2022-09-26 ENCOUNTER — Encounter: Payer: Self-pay | Admitting: Physical Therapy

## 2022-09-26 ENCOUNTER — Ambulatory Visit: Payer: Medicare Other | Admitting: Physical Therapy

## 2022-09-26 VITALS — BP 145/83 | HR 75

## 2022-09-26 DIAGNOSIS — R2689 Other abnormalities of gait and mobility: Secondary | ICD-10-CM

## 2022-09-26 DIAGNOSIS — M6281 Muscle weakness (generalized): Secondary | ICD-10-CM

## 2022-09-26 DIAGNOSIS — R2681 Unsteadiness on feet: Secondary | ICD-10-CM | POA: Diagnosis not present

## 2022-09-26 DIAGNOSIS — R278 Other lack of coordination: Secondary | ICD-10-CM | POA: Diagnosis not present

## 2022-09-26 NOTE — Therapy (Signed)
OUTPATIENT PHYSICAL THERAPY NEURO TREATMENT   Patient Name: Antonio Adams MRN: [id number] DOB:Oct 22, 1945, 77 y.o., male Today's Date: 09/26/2022   PCP: Sanda Linger, MD REFERRING PROVIDER: Almon Hercules, MD  END OF SESSION:  PT End of Session - 09/26/22 0934     Visit Number 6    Number of Visits 17    Date for PT Re-Evaluation 11/07/22    Authorization Type [employer] Medicare    Progress Note Due on Visit 10    PT Start Time 0934    PT Stop Time 1015    PT Time Calculation (min) 41 min    Equipment Utilized During Treatment Gait belt    Activity Tolerance Patient tolerated treatment well    Behavior During Therapy WFL for tasks assessed/performed             Past Medical History:  Diagnosis Date   ADENOCARCINOMA, PROSTATE, GLEASON GRADE 6 12/21/2009   prostate cancer   ALLERGIC RHINITIS 03/16/2007   Allergy    Anxiety    Arthritis    back    BACK PAIN WITH RADICULOPATHY 11/23/2008   Cancer of kidney (HCC)    partial right kidney removed   CARPAL TUNNEL SYNDROME, BILATERAL 03/16/2007   Chronic back pain    Constipation    takes Senokot daily   Depression    occasionally   DIABETES MELLITUS, TYPE II 03/16/2007   takes Januvia and MEtformin daily   GASTROINTESTINAL HEMORRHAGE, HX OF 03/16/2007   GERD 03/16/2007   takes Omeprazole daily   Glaucoma    mild - no eye drops   Hemorrhoids    History of colon polyps    HYPERLIPIDEMIA 03/16/2007   takes Crestor daily   HYPERTENSION 03/16/2007   takes Diltiazem and Lisinopril daily    Kidney cancer, primary, with metastasis from kidney to other site [place])    LEG CRAMPS 05/22/2007   Nocturia    Overactive bladder    PARESTHESIA 05/21/2007   Proctitis    PTSD (post-traumatic stress disorder)    wakes up may be dreaming of fighting   Rectal bleeding    Dr.Norins has explained its from the Radiation that he has received   Rectal bleeding    Renal cell carcinoma (HCC)    Sleep apnea    uses CPAP nightly   Stomach  cancer (HCC)    Urinary frequency    takes Toviaz daily   Past Surgical History:  Procedure Laterality Date   ADRENALECTOMY Right 02/26/2020   Procedure: OPEN RIGHT RENAL CELL METASTATECTOMY AND  ADRENALECTOMY;  Surgeon: Crist Fat, MD;  Location: [facility];  Service: Urology;  Laterality: Right;   BACK SURGERY  1983, 2008   repeat surgery; ESI '08   CARPAL TUNNEL RELEASE     bilateral   CERVICAL FUSION     COLONOSCOPY     colonosocpy     ESOPHAGOGASTRODUODENOSCOPY     ED   FLEXIBLE SIGMOIDOSCOPY N/A 01/08/2017   Procedure: FLEXIBLE SIGMOIDOSCOPY;  Surgeon: Hilarie Fredrickson, MD;  Location: [facility] ENDOSCOPY;  Service: Endoscopy;  Laterality: N/A;   HOT HEMOSTASIS N/A 01/08/2017   Procedure: HOT HEMOSTASIS (ARGON PLASMA COAGULATION/BICAP);  Surgeon: Hilarie Fredrickson, MD;  Location: [facility] ENDOSCOPY;  Service: Endoscopy;  Laterality: N/A;   IR RADIOLOGIST EVAL & MGMT  12/17/2019   IR RADIOLOGIST EVAL & MGMT  07/01/2020   IR RADIOLOGIST EVAL & MGMT  08/25/2020   LUMBAR LAMINECTOMY  10/30/2011   Procedure: MICRODISCECTOMY LUMBAR LAMINECTOMY;  Surgeon: Kerrin Champagne, MD;  Location: [facility] OR;  Service: Orthopedics;  Laterality: Right;  Right L4-5 and L5-S1 Microdiscectomy   LUMBAR LAMINECTOMY  06/17/2012   Procedure: MICRODISCECTOMY LUMBAR LAMINECTOMY;  Surgeon: Kerrin Champagne, MD;  Location: [facility] OR;  Service: Orthopedics;  Laterality: N/A;  Right L5-S1 microdiscectomy   OPEN PARTIAL HEPATECTOMY  N/A 02/26/2020   Procedure: OPEN PARTIAL HEPATECTOMY, CHOLECYSTECTOMY;  Surgeon: Almond Lint, MD;  Location: [facility];  Service: General;  Laterality: N/A;   POLYPECTOMY     PROSTATE SURGERY  march 2012   seed implant   RADIOFREQUENCY ABLATION N/A 07/21/2020   Procedure: CT MICROWAVE ABLATION;  Surgeon: Sterling Big, MD;  Location: [facility];  Service: Anesthesiology;  Laterality: N/A;   ROBOTIC ASSITED PARTIAL NEPHRECTOMY Right 09/08/2015   Procedure: XI ROBOTIC ASSITED RIGHT PARTIAL NEPHRECTOMY;  Surgeon: Crist Fat, MD;  Location: [facility];  Service: Urology;  Laterality: Right;  Clamp on: 1033 Clamp off: 1103 Total Clamp Time: 30 minutes   SHOULDER ARTHROSCOPY WITH SUBACROMIAL DECOMPRESSION Left 01/20/2016   Procedure: LEFT SHOULDER ARTHROSCOPY WITH EXTENSIVE  DEBRIDEMENT, ACROMIOPLASTY;  Surgeon: Loreta Ave, MD;  Location: [facility];  Service: Orthopedics;  Laterality: Left;   SHOULDER SURGERY  LFT   Stress Cardiolite  12/24/2003   negative for ischemia   UPPER ESOPHAGEAL ENDOSCOPIC ULTRASOUND (EUS)  04/18/2016   [facility]   UPPER GASTROINTESTINAL ENDOSCOPY     WOUND EXPLORATION     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   Patient Active Problem List   Diagnosis Date Noted   Traumatic coccydynia 08/30/2022   Acute ischemic stroke (HCC) 08/24/2022   Encounter for general adult medical examination with abnormal findings 01/23/2022   Anemia due to zinc deficiency 12/20/2021   Symptomatic anemia 12/15/2021   Cancer related pain 12/15/2021   Chronic heart failure with preserved ejection fraction (HFpEF) (HCC) 08/12/2021   Tinea cruris 08/12/2021   Anemia due to chronic kidney disease 07/20/2021   CKD (chronic kidney disease) stage 5, GFR less than 15 ml/min (HCC) 05/24/2021   BPH (benign prostatic hyperplasia) 05/24/2021   Senile nuclear sclerosis 05/20/2021   Vitamin D deficiency 05/20/2021   Vitreous degeneration, unspecified eye 05/20/2021   Intrinsic eczema 04/18/2021   Atherosclerosis of aorta (HCC) 12/30/2020   Mixed conductive and sensorineural hearing loss of left ear with restricted hearing of right ear 09/01/2020   Metastatic renal cell carcinoma (HCC) 02/26/2020   Diabetic polyneuropathy associated with type 2 diabetes mellitus (HCC) 12/18/2019   Murmur, cardiac 10/08/2018   DDD (degenerative disc disease), cervical 10/08/2018   OAB (overactive bladder) 04/04/2018   Seasonal allergic rhinitis due to pollen 11/01/2016   Post traumatic  stress disorder (PTSD) 01/29/2014   Spinal stenosis of lumbar region with radiculopathy 10/30/2011    Class: Chronic   Obstructive sleep apnea 11/24/2010   Cognitive impairment 11/24/2010   ADENOCARCINOMA, PROSTATE, GLEASON GRADE 6 12/21/2009   Essential hypertension 03/16/2007   GERD 03/16/2007    ONSET DATE: 08/25/2022 (referral)   REFERRING DIAG:  I63.9 (ICD-10-CM) - Cerebrovascular accident (CVA), unspecified mechanism    THERAPY DIAG:  Muscle weakness (generalized)  Unsteadiness on feet  Other abnormalities of gait and mobility  Rationale for Evaluation and Treatment: Rehabilitation  SUBJECTIVE:  SUBJECTIVE STATEMENT:  Patient states he is doing alright; he states that his back is about the same. Step mostly above 1000 step goal with exception of weekend 400 and 900 steps due to patient not feeling well and having stomach issues. Patient caregiver states curbs have been looking better. Denies falls/near falls.    Pt accompanied by: self and significant other - fiance [name]  PERTINENT HISTORY:  Patient was discharged from this setting on 3/7 but returned to the ED on 4/11 due to acute onset of dizziness. Patient found to have an acute left parietal-occipital perventricular infarcts in region of prior infarct.   PMH: ESRD not on HD, renal cancer metastatic to liver, adrenals and lymph nodes no longer on chemo, GERD, L PCA infarct November 2023, DM2, and anemia.  PAIN:  Are you having pain? Yes: NPRS scale: 8-9/10 Pain location: right knee Pain description: sharp, achy Aggravating factors: moving Relieving factors: steroid shot  PRECAUTIONS: Fall - R hemibody  WEIGHT BEARING RESTRICTIONS: No  FALLS: Has patient fallen in last 6 months? Yes. Number of falls 1 at time of initial  stroke  PATIENT GOALS: "To step over curb without losing my balance."   OBJECTIVE:   DIAGNOSTIC FINDINGS:   MR BRAIN WO IMPRESSION on 08/24/2022: Acute left parieto-occipital periventricular infarcts in the region of prior infarct.  COGNITION: Overall cognitive status: Impaired   TODAY'S TREATMENT:                                                                                                                               Vitals:   09/26/22 0938  BP: (!) 145/83  Pulse: 75   TherAct:  [facility] PT Assessment - 09/26/22 0001       Standardized Balance Assessment   Standardized Balance Assessment Five Times Sit to Stand;10 meter walk test;Timed Up and Go Test    Five times sit to stand comments  21.91   with use of UE (SBA)   10 Meter Walk 0.81 m/s   with SPC (SBA) (2.66 ft/sec)     Timed Up and Go Test   Normal TUG (seconds) 13.5   with SPC (SBA)           Education on increasing steps using [employer] App with patient/caregiver.  Patient arrives to session greatest number steps since last Friday was around 2800 steps. Current steps at start of session 393.  Lowest step count 419 (3 days crossing 1,000 since last session).  Assess STG/LTGs:   [facility] PT Assessment - 09/26/22 0001       Standardized Balance Assessment   Standardized Balance Assessment Five Times Sit to Stand;10 meter walk test;Timed Up and Go Test    Five times sit to stand comments  21.91   with use of UE (SBA)   10 Meter Walk 0.81 m/s   with SPC (SBA) (2.66 ft/sec)     Timed Up and Go Test   Normal  TUG (seconds) 13.5   with SPC (SBA)           CURB:  Level of Assistance: SBA and CGA Assistive device utilized: Single point cane Curb Comments: initially has poor foot placement on initial step and slight signs of retropulsion though patient able to self correct; trialed again with CGA-SBA x 2 reps and improved with no signs of unsteadiness  NMR:  Obstacle Course: Sit to stand x 2 from chairs,  foam block step over, 4" step up and down, 2 x 6" hurdle step overs (CGA-SBA) 15min35" with SPC 36min27" with SPC 59min13" with SPC 55min2" with [facility]  PATIENT EDUCATION: Education details: Goal target w/ app, continue HEP and walking program. Person educated: Patient and Caregiver fiance Education method: Explanation Education comprehension: verbalized understanding and needs further education  HOME EXERCISE PROGRAM:  HEP provided at D/C at last PT visit; recommend to continue at this time:   Access Code: [email or web address] URL: [email or web address] Date: 09/12/2022 Prepared by: Camille Bal  Exercises - Corner Balance Feet Together With Eyes Closed  - 1 x daily - 5 x weekly - 1 sets - 2-3 reps - 30-45 seconds hold - Sit to Stand with Arms Crossed  - 1 x daily - 5 x weekly - 1-2 sets - 8 reps - Seated Hamstring Stretch  - 1 x daily - 5 x weekly - 1 sets - 2-3 reps - 45 seconds hold - Standing Tandem Balance with Counter Support  - 1 x daily - 5 x weekly - 1 sets - 2 reps - 45 seconds hold - Standing Single Leg Stance with Counter Support  - 1 x daily - 5 x weekly - 1 sets - 2 reps - 45 seconds hold   You Can Walk For A Certain Length Of Time Each Day                          Walk 2-3 minutes or 250 steps 4-5 times per day.             Increase 1-2 minutes or 50 steps  every 7 days              Work up to 15 minutes or 700 steps (1-2 times per day).               Example:                         Day 1-2           4-5 minutes     3 times per day                         Day 7-8           10-12 minutes 2-3 times per day                         Day 13-14       20-22 minutes 1-2 times per day  GOALS: Goals reviewed with patient? Yes  SHORT TERM GOALS: Target date: 09/26/2022  Patient will demonstrate 100% compliance with initial HEP to continue to progress between physical therapy sessions.   Baseline: Continue with HEP provided at last PT D/C (still appropriate);  progressing well and progressing towards step goal target Goal status: IN PROGRESS  2.  Patient will improve 5x Sit to  Stand score to 32" or less with minimal amount of UE use necessary to demonstrate a minimal clinically important difference towards a decreased  risk for falls and improved lower extremity strength.   Baseline: 37.34" with mix of thigh assist and UE support, one forward LOB; improved to 21.91" with UE use Goal status: MET  3.  Patient will improve TUG score to 15" or less with LRAD and no signs of imbalance to indicate a decreased risk of falls and demonstrate improved overall mobility.   Baseline: 15.23" without SPC with increased signs of instability, 19.58" with SPC (SBA); 13.5" with SPC  Goal status: MET  4.  Patient will improve gait speed to 0.7 m/s or greater with LRAD to indicate progress towards a decreased risk for falls.   Baseline: 0.60 m/s or 1.97 ft/sec with SPC (SBA); 0.81 m/s with SPC (2.66 ft/sec)  Goal status: MET  5.  Berg balance test to be assessed/goal written as indicated Baseline: Assessed Goal status: Discontinued STG due to level of baseline score  6.  Patient will ascend/descend curb with SBA and LRAD in order to more easily access community while shopping with fiance.  Baseline: CGA with SPC, SBA-CGA with SPC Goal status: Progressing  LONG TERM GOALS: Target date: 10/24/2022  Patient will report demonstrate independence with final HEP in order to maintain current gains and continue to progress after physical therapy discharge.   Baseline: To be provided Goal status: INITIAL  2.   Patient will improve 5x Sit to Stand score to 27" or less with minimal amount of UE use necessary to demonstrate progress towards a decreased  risk for falls and improved lower extremity strength.   Baseline: 37.34" with mix of thigh assist and UE support, one forward LOB ; one forward LOB; improved to 21.91" with UE use Goal status: IN PROGRESS  3.  Patient will  improve TUG score to 13.5" or less with LRAD and no signs of imbalance to indicate a decreased risk of falls and demonstrate improved overall mobility.   Baseline: 15.23" without SPC with increased signs of instability, 19.58; 13.5" with SPC  Goal status: MET  4.  Patient will improve gait speed to 0.85 m/s to indicate improvement to the level of community ambulator in order to participate more easily in activities outside of the home.   Baseline: 0.60 m/s or 1.97 ft/sec with SPC (SBA); 0.81 m/s with SPC (2.66 ft/sec)  Goal status: REVISED - patient met initial goal on 09/26/2022  5.  Patient will improve Berg Balance score by 5 points to indicate clinically significant progress towards a decreased risk of falls and improved static stability.   Baseline: 41/56 Goal status: INITIAL  6.  Patient will ascend/descend curb with modI and LRAD in order to more easily access community while shopping with fiance.  Baseline: CGA with SPC; CGA with SPC Goal status: IN PROGRESS  7.  Patient will improve FOTO score to 69 to achieve predicted improvements in functional mobility due to skilled physical therapy interventions to increase safety with and participation in daily activities. Baseline: 56 Goal status: INITIAL  ASSESSMENT:  CLINICAL IMPRESSION: Session emphasized assessment of STGs in addition to working on sit to stand transfers and step overs at end of session as these are activities the patient continues to struggle with. Patient is progressing well towards 2/5 goals and achieved 3/5 STGs. Continues to require reinforcement on HEP. He continues to benefit from skilled PT to address ongoing functional weakness, gait deficits,  and dynamic stability.   OBJECTIVE IMPAIRMENTS: Abnormal gait, decreased activity tolerance, decreased balance, decreased cognition, decreased endurance, decreased knowledge of condition, decreased knowledge of use of DME, decreased mobility, difficulty walking, decreased  ROM, decreased strength, decreased safety awareness, impaired tone, and impaired vision/preception.   ACTIVITY LIMITATIONS: carrying, bending, standing, squatting, transfers, and locomotion level  PARTICIPATION LIMITATIONS: laundry, driving, community activity, and yard work  PERSONAL FACTORS: Age, Past/current experiences, and 3+ comorbidities: see above  are also affecting patient's functional outcome.   REHAB POTENTIAL: Good  CLINICAL DECISION MAKING: Evolving/moderate complexity  EVALUATION COMPLEXITY: Moderate  PLAN:  PT FREQUENCY: 2x/week  PT DURATION: 8 weeks  PLANNED INTERVENTIONS: Therapeutic exercises, Therapeutic activity, Neuromuscular re-education, Balance training, Gait training, Patient/Family education, Self Care, Joint mobilization, Manual therapy, and Re-evaluation  PLAN FOR NEXT SESSION: update HEP as needed, high intensity gait training, how is walking program and updates to HEP?, hip strengthening, dynamic balance tasks, assess step count with app and modify goal as needed, work on sit to stand transfer safety and fully turning before sitting down   Carmelia Bake, PT, DPT 09/26/2022, 12:09 PM

## 2022-09-28 ENCOUNTER — Encounter: Payer: Self-pay | Admitting: Physical Therapy

## 2022-09-28 ENCOUNTER — Ambulatory Visit: Payer: Medicare Other | Admitting: Physical Therapy

## 2022-09-28 VITALS — BP 153/86 | HR 73

## 2022-09-28 DIAGNOSIS — R278 Other lack of coordination: Secondary | ICD-10-CM | POA: Diagnosis not present

## 2022-09-28 DIAGNOSIS — R2681 Unsteadiness on feet: Secondary | ICD-10-CM

## 2022-09-28 DIAGNOSIS — R2689 Other abnormalities of gait and mobility: Secondary | ICD-10-CM

## 2022-09-28 DIAGNOSIS — M6281 Muscle weakness (generalized): Secondary | ICD-10-CM

## 2022-09-28 NOTE — Therapy (Signed)
OUTPATIENT PHYSICAL THERAPY NEURO TREATMENT   Patient Name: Antonio Adams MRN: [id number] DOB:[birth year], 77 y.o., male Today's Date: 09/28/2022   PCP: Sanda Linger, MD REFERRING PROVIDER: Almon Hercules, MD  END OF SESSION:  PT End of Session - 09/28/22 0936     Visit Number 7    Number of Visits 17    Date for PT Re-Evaluation 11/07/22    Authorization Type [employer] Medicare    Progress Note Due on Visit 10    PT Start Time 0933    PT Stop Time 1013    PT Time Calculation (min) 40 min    Equipment Utilized During Treatment Gait belt    Activity Tolerance Patient tolerated treatment well    Behavior During Therapy WFL for tasks assessed/performed             Past Medical History:  Diagnosis Date   ADENOCARCINOMA, PROSTATE, GLEASON GRADE 6 12/21/2009   prostate cancer   ALLERGIC RHINITIS 03/16/2007   Allergy    Anxiety    Arthritis    back    BACK PAIN WITH RADICULOPATHY 11/23/2008   Cancer of kidney (HCC)    partial right kidney removed   CARPAL TUNNEL SYNDROME, BILATERAL 03/16/2007   Chronic back pain    Constipation    takes Senokot daily   Depression    occasionally   DIABETES MELLITUS, TYPE II 03/16/2007   takes Januvia and MEtformin daily   GASTROINTESTINAL HEMORRHAGE, HX OF 03/16/2007   GERD 03/16/2007   takes Omeprazole daily   Glaucoma    mild - no eye drops   Hemorrhoids    History of colon polyps    HYPERLIPIDEMIA 03/16/2007   takes Crestor daily   HYPERTENSION 03/16/2007   takes Diltiazem and Lisinopril daily    Kidney cancer, primary, with metastasis from kidney to other site [facility])    LEG CRAMPS 05/22/2007   Nocturia    Overactive bladder    PARESTHESIA 05/21/2007   Proctitis    PTSD (post-traumatic stress disorder)    wakes up may be dreaming of fighting   Rectal bleeding    Dr.Norins has explained its from the Radiation that he has received   Rectal bleeding    Renal cell carcinoma (HCC)    Sleep apnea    uses CPAP nightly   Stomach  cancer (HCC)    Urinary frequency    takes Toviaz daily   Past Surgical History:  Procedure Laterality Date   ADRENALECTOMY Right 02/26/2020   Procedure: OPEN RIGHT RENAL CELL METASTATECTOMY AND  ADRENALECTOMY;  Surgeon: Crist Fat, MD;  Location: [facility];  Service: Urology;  Laterality: Right;   BACK SURGERY  1983, 2008   repeat surgery; ESI '08   CARPAL TUNNEL RELEASE     bilateral   CERVICAL FUSION     COLONOSCOPY     colonosocpy     ESOPHAGOGASTRODUODENOSCOPY     ED   FLEXIBLE SIGMOIDOSCOPY N/A 01/08/2017   Procedure: FLEXIBLE SIGMOIDOSCOPY;  Surgeon: Hilarie Fredrickson, MD;  Location: [facility] ENDOSCOPY;  Service: Endoscopy;  Laterality: N/A;   HOT HEMOSTASIS N/A 01/08/2017   Procedure: HOT HEMOSTASIS (ARGON PLASMA COAGULATION/BICAP);  Surgeon: Hilarie Fredrickson, MD;  Location: [facility] ENDOSCOPY;  Service: Endoscopy;  Laterality: N/A;   IR RADIOLOGIST EVAL & MGMT  12/17/2019   IR RADIOLOGIST EVAL & MGMT  07/01/2020   IR RADIOLOGIST EVAL & MGMT  08/25/2020   LUMBAR LAMINECTOMY  10/30/2011   Procedure: MICRODISCECTOMY LUMBAR LAMINECTOMY;  Surgeon: Kerrin Champagne, MD;  Location: [facility] OR;  Service: Orthopedics;  Laterality: Right;  Right L4-5 and L5-S1 Microdiscectomy   LUMBAR LAMINECTOMY  06/17/2012   Procedure: MICRODISCECTOMY LUMBAR LAMINECTOMY;  Surgeon: Kerrin Champagne, MD;  Location: [facility] OR;  Service: Orthopedics;  Laterality: N/A;  Right L5-S1 microdiscectomy   OPEN PARTIAL HEPATECTOMY  N/A 02/26/2020   Procedure: OPEN PARTIAL HEPATECTOMY, CHOLECYSTECTOMY;  Surgeon: Almond Lint, MD;  Location: [facility];  Service: General;  Laterality: N/A;   POLYPECTOMY     PROSTATE SURGERY  march 2012   seed implant   RADIOFREQUENCY ABLATION N/A 07/21/2020   Procedure: CT MICROWAVE ABLATION;  Surgeon: Sterling Big, MD;  Location: [facility];  Service: Anesthesiology;  Laterality: N/A;   ROBOTIC ASSITED PARTIAL NEPHRECTOMY Right 09/08/2015   Procedure: XI ROBOTIC ASSITED RIGHT PARTIAL NEPHRECTOMY;  Surgeon: Crist Fat, MD;  Location: [facility];  Service: Urology;  Laterality: Right;  Clamp on: 1033 Clamp off: 1103 Total Clamp Time: 30 minutes   SHOULDER ARTHROSCOPY WITH SUBACROMIAL DECOMPRESSION Left 01/20/2016   Procedure: LEFT SHOULDER ARTHROSCOPY WITH EXTENSIVE  DEBRIDEMENT, ACROMIOPLASTY;  Surgeon: Loreta Ave, MD;  Location: [facility];  Service: Orthopedics;  Laterality: Left;   SHOULDER SURGERY  LFT   Stress Cardiolite  12/24/2003   negative for ischemia   UPPER ESOPHAGEAL ENDOSCOPIC ULTRASOUND (EUS)  04/18/2016   [facility]   UPPER GASTROINTESTINAL ENDOSCOPY     WOUND EXPLORATION     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   Patient Active Problem List   Diagnosis Date Noted   Traumatic coccydynia 08/30/2022   Acute ischemic stroke (HCC) 08/24/2022   Encounter for general adult medical examination with abnormal findings 01/23/2022   Anemia due to zinc deficiency 12/20/2021   Symptomatic anemia 12/15/2021   Cancer related pain 12/15/2021   Chronic heart failure with preserved ejection fraction (HFpEF) (HCC) 08/12/2021   Tinea cruris 08/12/2021   Anemia due to chronic kidney disease 07/20/2021   CKD (chronic kidney disease) stage 5, GFR less than 15 ml/min (HCC) 05/24/2021   BPH (benign prostatic hyperplasia) 05/24/2021   Senile nuclear sclerosis 05/20/2021   Vitamin D deficiency 05/20/2021   Vitreous degeneration, unspecified eye 05/20/2021   Intrinsic eczema 04/18/2021   Atherosclerosis of aorta (HCC) 12/30/2020   Mixed conductive and sensorineural hearing loss of left ear with restricted hearing of right ear 09/01/2020   Metastatic renal cell carcinoma (HCC) 02/26/2020   Diabetic polyneuropathy associated with type 2 diabetes mellitus (HCC) 12/18/2019   Murmur, cardiac 10/08/2018   DDD (degenerative disc disease), cervical 10/08/2018   OAB (overactive bladder) 04/04/2018   Seasonal allergic rhinitis due to pollen 11/01/2016   Post traumatic  stress disorder (PTSD) 01/29/2014   Spinal stenosis of lumbar region with radiculopathy 10/30/2011    Class: Chronic   Obstructive sleep apnea 11/24/2010   Cognitive impairment 11/24/2010   ADENOCARCINOMA, PROSTATE, GLEASON GRADE 6 12/21/2009   Essential hypertension 03/16/2007   GERD 03/16/2007    ONSET DATE: 08/25/2022 (referral)   REFERRING DIAG:  I63.9 (ICD-10-CM) - Cerebrovascular accident (CVA), unspecified mechanism    THERAPY DIAG:  Muscle weakness (generalized)  Unsteadiness on feet  Other abnormalities of gait and mobility  Rationale for Evaluation and Treatment: Rehabilitation  SUBJECTIVE:  SUBJECTIVE STATEMENT:  Patient denies acute changes. Denies falls/near falls.   Pt accompanied by: self and significant other - fiance [name]  PERTINENT HISTORY:  Patient was discharged from this setting on 3/7 but returned to the ED on 4/11 due to acute onset of dizziness. Patient found to have an acute left parietal-occipital perventricular infarcts in region of prior infarct.   PMH: ESRD not on HD, renal cancer metastatic to liver, adrenals and lymph nodes no longer on chemo, GERD, L PCA infarct November 2023, DM2, and anemia.  PAIN:  Are you having pain? No  PRECAUTIONS: Fall - R hemibody  WEIGHT BEARING RESTRICTIONS: No  FALLS: Has patient fallen in last 6 months? Yes. Number of falls 1 at time of initial stroke  PATIENT GOALS: "To step over curb without losing my balance."   OBJECTIVE:   DIAGNOSTIC FINDINGS:   MR BRAIN WO IMPRESSION on 08/24/2022: Acute left parieto-occipital periventricular infarcts in the region of prior infarct.  COGNITION: Overall cognitive status: Impaired   TODAY'S TREATMENT:                                                                                                                                Vitals:   09/28/22 0941  BP: (!) 153/86  Pulse: 73    TherAct:  Education on increasing steps using [employer] App with patient/caregiver.  Patient arrives to session with only 473 steps on Wednesday due to poor weather and stating they were in side most of the day. Arrives to session with > 800 steps thus far today.  : 852 feet with SPC and SBA (~1.4x further than when last assessed on 09/14/2022) unsure on RPE   NMR:  Lateral stepping with red theraband resistance with cone retrieval along with single UE on bar 9 cones x 20 feet (CGA) Fwd/bckwd stepping with red theraband resistance with cone retrieval along with single UE on bar 9 cones x 10 feet (CGA) Cone semicircle taps 2 x 4 cones bilaterally (CGA)  PATIENT EDUCATION: Education details: Steps goal 1,000 continued, continue HEP  Person educated: Patient and Caregiver fiance Education method: Explanation Education comprehension: verbalized understanding and needs further education  HOME EXERCISE PROGRAM:  HEP provided at D/C at last PT visit; recommend to continue at this time:   Access Code: [email or web address] URL: [email or web address] Date: 09/12/2022 Prepared by: Camille Bal  Exercises - Corner Balance Feet Together With Eyes Closed  - 1 x daily - 5 x weekly - 1 sets - 2-3 reps - 30-45 seconds hold - Sit to Stand with Arms Crossed  - 1 x daily - 5 x weekly - 1-2 sets - 8 reps - Seated Hamstring Stretch  - 1 x daily - 5 x weekly - 1 sets - 2-3 reps - 45 seconds hold - Standing Tandem Balance with Counter Support  - 1 x daily - 5 x weekly - 1 sets - 2 reps - 45 seconds hold -  Standing Single Leg Stance with Counter Support  - 1 x daily - 5 x weekly - 1 sets - 2 reps - 45 seconds hold   You Can Walk For A Certain Length Of Time Each Day                          Walk 2-3 minutes or 250 steps 4-5 times per day.             Increase 1-2 minutes or 50  steps  every 7 days              Work up to 15 minutes or 700 steps (1-2 times per day).               Example:                         Day 1-2           4-5 minutes     3 times per day                         Day 7-8           10-12 minutes 2-3 times per day                         Day 13-14       20-22 minutes 1-2 times per day  GOALS: Goals reviewed with patient? Yes  SHORT TERM GOALS: Target date: 09/26/2022  Patient will demonstrate 100% compliance with initial HEP to continue to progress between physical therapy sessions.   Baseline: Continue with HEP provided at last PT D/C (still appropriate); progressing well and progressing towards step goal target Goal status: IN PROGRESS  2.  Patient will improve 5x Sit to Stand score to 32" or less with minimal amount of UE use necessary to demonstrate a minimal clinically important difference towards a decreased  risk for falls and improved lower extremity strength.   Baseline: 37.34" with mix of thigh assist and UE support, one forward LOB; improved to 21.91" with UE use Goal status: MET  3.  Patient will improve TUG score to 15" or less with LRAD and no signs of imbalance to indicate a decreased risk of falls and demonstrate improved overall mobility.   Baseline: 15.23" without SPC with increased signs of instability, 19.58" with SPC (SBA); 13.5" with SPC  Goal status: MET  4.  Patient will improve gait speed to 0.7 m/s or greater with LRAD to indicate progress towards a decreased risk for falls.   Baseline: 0.60 m/s or 1.97 ft/sec with SPC (SBA); 0.81 m/s with SPC (2.66 ft/sec)  Goal status: MET  5.  Berg balance test to be assessed/goal written as indicated Baseline: Assessed Goal status: Discontinued STG due to level of baseline score  6.  Patient will ascend/descend curb with SBA and LRAD in order to more easily access community while shopping with fiance.  Baseline: CGA with SPC, SBA-CGA with SPC Goal status: Progressing  LONG  TERM GOALS: Target date: 10/24/2022  Patient will report demonstrate independence with final HEP in order to maintain current gains and continue to progress after physical therapy discharge.   Baseline: To be provided Goal status: INITIAL  2.   Patient will improve 5x Sit to Stand score to 27" or less with minimal amount of UE  use necessary to demonstrate progress towards a decreased  risk for falls and improved lower extremity strength.   Baseline: 37.34" with mix of thigh assist and UE support, one forward LOB ; one forward LOB; improved to 21.91" with UE use Goal status: IN PROGRESS  3.  Patient will improve TUG score to 13.5" or less with LRAD and no signs of imbalance to indicate a decreased risk of falls and demonstrate improved overall mobility.   Baseline: 15.23" without SPC with increased signs of instability, 19.58; 13.5" with SPC  Goal status: MET  4.  Patient will improve gait speed to 0.85 m/s to indicate improvement to the level of community ambulator in order to participate more easily in activities outside of the home.   Baseline: 0.60 m/s or 1.97 ft/sec with SPC (SBA); 0.81 m/s with SPC (2.66 ft/sec)  Goal status: REVISED - patient met initial goal on 09/26/2022  5.  Patient will improve Berg Balance score by 5 points to indicate clinically significant progress towards a decreased risk of falls and improved static stability.   Baseline: 41/56 Goal status: INITIAL  6.  Patient will ascend/descend curb with modI and LRAD in order to more easily access community while shopping with fiance.  Baseline: CGA with SPC; CGA with SPC Goal status: IN PROGRESS  7.  Patient will improve FOTO score to 69 to achieve predicted improvements in functional mobility due to skilled physical therapy interventions to increase safety with and participation in daily activities. Baseline: 56 Goal status: INITIAL  ASSESSMENT:  CLINICAL IMPRESSION: Patient is progressing well; he demonstrates  improved cardiorespiratory endurance as indicated by improving by nearly 1.48x last measured distance. Patient also tolerates multidirectional gait well. Greatest difficulty with toe taps to cones with multiple errors knocking over cones due to increased difficulty with modified single leg stance stability. Patient also demonstrates improved carryover with sit to stand transfers discussed last session. He continues to benefit from skilled PT to address ongoing functional weakness, gait deficits, and dynamic stability.   OBJECTIVE IMPAIRMENTS: Abnormal gait, decreased activity tolerance, decreased balance, decreased cognition, decreased endurance, decreased knowledge of condition, decreased knowledge of use of DME, decreased mobility, difficulty walking, decreased ROM, decreased strength, decreased safety awareness, impaired tone, and impaired vision/preception.   ACTIVITY LIMITATIONS: carrying, bending, standing, squatting, transfers, and locomotion level  PARTICIPATION LIMITATIONS: laundry, driving, community activity, and yard work  PERSONAL FACTORS: Age, Past/current experiences, and 3+ comorbidities: see above  are also affecting patient's functional outcome.   REHAB POTENTIAL: Good  CLINICAL DECISION MAKING: Evolving/moderate complexity  EVALUATION COMPLEXITY: Moderate  PLAN:  PT FREQUENCY: 2x/week  PT DURATION: 8 weeks  PLANNED INTERVENTIONS: Therapeutic exercises, Therapeutic activity, Neuromuscular re-education, Balance training, Gait training, Patient/Family education, Self Care, Joint mobilization, Manual therapy, and Re-evaluation  PLAN FOR NEXT SESSION: update HEP as needed, high intensity gait training, how is walking program and updates to HEP?, hip strengthening, dynamic balance tasks, assess step count with app and modify goal as needed, work on foot clearance activities and modified SLS  Carmelia Bake, PT, DPT 09/28/2022, 11:26 AM

## 2022-10-02 ENCOUNTER — Other Ambulatory Visit: Payer: Self-pay | Admitting: Internal Medicine

## 2022-10-02 ENCOUNTER — Encounter (HOSPITAL_COMMUNITY)
Admission: RE | Admit: 2022-10-02 | Discharge: 2022-10-02 | Disposition: A | Discharge status: Home / Self Care | Payer: Medicare Other | Source: Ambulatory Visit | Attending: Nephrology | Admitting: Nephrology

## 2022-10-02 DIAGNOSIS — N185 Chronic kidney disease, stage 5: Secondary | ICD-10-CM | POA: Diagnosis not present

## 2022-10-02 DIAGNOSIS — N3281 Overactive bladder: Secondary | ICD-10-CM

## 2022-10-02 LAB — IRON AND TIBC
Iron: 52 ug/dL (ref 45–182)
Saturation Ratios: 20 % (ref 17.9–39.5)
TIBC: 263 ug/dL (ref 250–450)
UIBC: 211 ug/dL

## 2022-10-02 LAB — POCT HEMOGLOBIN-HEMACUE: Hemoglobin: 10.6 g/dL — ABNORMAL LOW (ref 13.0–17.0)

## 2022-10-02 LAB — FERRITIN: Ferritin: 378 ng/mL — ABNORMAL HIGH (ref 24–336)

## 2022-10-02 MED ORDER — EPOETIN ALFA-EPBX 40000 UNIT/ML IJ SOLN
INTRAMUSCULAR | Status: AC
  Filled 2022-10-02: qty 1

## 2022-10-02 MED ORDER — EPOETIN ALFA-EPBX 40000 UNIT/ML IJ SOLN
30000.0000 [IU] | INTRAMUSCULAR | Status: DC
  Administered 2022-10-02: 30000 [IU] via SUBCUTANEOUS

## 2022-10-03 ENCOUNTER — Encounter: Payer: Self-pay | Admitting: Physical Therapy

## 2022-10-03 ENCOUNTER — Ambulatory Visit: Payer: Medicare Other | Admitting: Physical Therapy

## 2022-10-03 VITALS — BP 144/92 | HR 86

## 2022-10-03 DIAGNOSIS — R2681 Unsteadiness on feet: Secondary | ICD-10-CM | POA: Diagnosis not present

## 2022-10-03 DIAGNOSIS — R2689 Other abnormalities of gait and mobility: Secondary | ICD-10-CM | POA: Diagnosis not present

## 2022-10-03 DIAGNOSIS — R278 Other lack of coordination: Secondary | ICD-10-CM | POA: Diagnosis not present

## 2022-10-03 DIAGNOSIS — M6281 Muscle weakness (generalized): Secondary | ICD-10-CM | POA: Diagnosis not present

## 2022-10-03 NOTE — Therapy (Unsigned)
OUTPATIENT PHYSICAL THERAPY NEURO TREATMENT   Patient Name: Antonio Adams MRN: [id number] DOB:Mar 22, 1946, 77 y.o., male Today's Date: 10/03/2022   PCP: Sanda Linger, MD REFERRING PROVIDER: Almon Hercules, MD  END OF SESSION:  PT End of Session - 10/03/22 1022     Visit Number 8    Number of Visits 17    Date for PT Re-Evaluation 11/07/22    Authorization Type [employer] Medicare    Progress Note Due on Visit 10    PT Start Time 1018    PT Stop Time 1102    PT Time Calculation (min) 44 min    Equipment Utilized During Treatment Gait belt    Activity Tolerance Patient tolerated treatment well    Behavior During Therapy WFL for tasks assessed/performed             Past Medical History:  Diagnosis Date   ADENOCARCINOMA, PROSTATE, GLEASON GRADE 6 12/21/2009   prostate cancer   ALLERGIC RHINITIS 03/16/2007   Allergy    Anxiety    Arthritis    back    BACK PAIN WITH RADICULOPATHY 11/23/2008   Cancer of kidney (HCC)    partial right kidney removed   CARPAL TUNNEL SYNDROME, BILATERAL 03/16/2007   Chronic back pain    Constipation    takes Senokot daily   Depression    occasionally   DIABETES MELLITUS, TYPE II 03/16/2007   takes Januvia and MEtformin daily   GASTROINTESTINAL HEMORRHAGE, HX OF 03/16/2007   GERD 03/16/2007   takes Omeprazole daily   Glaucoma    mild - no eye drops   Hemorrhoids    History of colon polyps    HYPERLIPIDEMIA 03/16/2007   takes Crestor daily   HYPERTENSION 03/16/2007   takes Diltiazem and Lisinopril daily    Kidney cancer, primary, with metastasis from kidney to other site [facility])    LEG CRAMPS 05/22/2007   Nocturia    Overactive bladder    PARESTHESIA 05/21/2007   Proctitis    PTSD (post-traumatic stress disorder)    wakes up may be dreaming of fighting   Rectal bleeding    Dr.Norins has explained its from the Radiation that he has received   Rectal bleeding    Renal cell carcinoma (HCC)    Sleep apnea    uses CPAP nightly   Stomach  cancer (HCC)    Urinary frequency    takes Toviaz daily   Past Surgical History:  Procedure Laterality Date   ADRENALECTOMY Right 02/26/2020   Procedure: OPEN RIGHT RENAL CELL METASTATECTOMY AND  ADRENALECTOMY;  Surgeon: Crist Fat, MD;  Location: [facility];  Service: Urology;  Laterality: Right;   BACK SURGERY  1983, 2008   repeat surgery; ESI '08   CARPAL TUNNEL RELEASE     bilateral   CERVICAL FUSION     COLONOSCOPY     colonosocpy     ESOPHAGOGASTRODUODENOSCOPY     ED   FLEXIBLE SIGMOIDOSCOPY N/A 01/08/2017   Procedure: FLEXIBLE SIGMOIDOSCOPY;  Surgeon: Hilarie Fredrickson, MD;  Location: [facility] ENDOSCOPY;  Service: Endoscopy;  Laterality: N/A;   HOT HEMOSTASIS N/A 01/08/2017   Procedure: HOT HEMOSTASIS (ARGON PLASMA COAGULATION/BICAP);  Surgeon: Hilarie Fredrickson, MD;  Location: [facility] ENDOSCOPY;  Service: Endoscopy;  Laterality: N/A;   IR RADIOLOGIST EVAL & MGMT  12/17/2019   IR RADIOLOGIST EVAL & MGMT  07/01/2020   IR RADIOLOGIST EVAL & MGMT  08/25/2020   LUMBAR LAMINECTOMY  10/30/2011   Procedure: MICRODISCECTOMY LUMBAR LAMINECTOMY;  Surgeon: Kerrin Champagne, MD;  Location: [facility] OR;  Service: Orthopedics;  Laterality: Right;  Right L4-5 and L5-S1 Microdiscectomy   LUMBAR LAMINECTOMY  06/17/2012   Procedure: MICRODISCECTOMY LUMBAR LAMINECTOMY;  Surgeon: Kerrin Champagne, MD;  Location: [facility] OR;  Service: Orthopedics;  Laterality: N/A;  Right L5-S1 microdiscectomy   OPEN PARTIAL HEPATECTOMY  N/A 02/26/2020   Procedure: OPEN PARTIAL HEPATECTOMY, CHOLECYSTECTOMY;  Surgeon: Almond Lint, MD;  Location: [facility];  Service: General;  Laterality: N/A;   POLYPECTOMY     PROSTATE SURGERY  march 2012   seed implant   RADIOFREQUENCY ABLATION N/A 07/21/2020   Procedure: CT MICROWAVE ABLATION;  Surgeon: Sterling Big, MD;  Location: [facility];  Service: Anesthesiology;  Laterality: N/A;   ROBOTIC ASSITED PARTIAL NEPHRECTOMY Right 09/08/2015   Procedure: XI ROBOTIC ASSITED RIGHT PARTIAL NEPHRECTOMY;  Surgeon: Crist Fat, MD;  Location: [facility];  Service: Urology;  Laterality: Right;  Clamp on: 1033 Clamp off: 1103 Total Clamp Time: 30 minutes   SHOULDER ARTHROSCOPY WITH SUBACROMIAL DECOMPRESSION Left 01/20/2016   Procedure: LEFT SHOULDER ARTHROSCOPY WITH EXTENSIVE  DEBRIDEMENT, ACROMIOPLASTY;  Surgeon: Loreta Ave, MD;  Location: [facility];  Service: Orthopedics;  Laterality: Left;   SHOULDER SURGERY  LFT   Stress Cardiolite  12/24/2003   negative for ischemia   UPPER ESOPHAGEAL ENDOSCOPIC ULTRASOUND (EUS)  04/18/2016   [facility]   UPPER GASTROINTESTINAL ENDOSCOPY     WOUND EXPLORATION     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   Patient Active Problem List   Diagnosis Date Noted   Traumatic coccydynia 08/30/2022   Acute ischemic stroke (HCC) 08/24/2022   Encounter for general adult medical examination with abnormal findings 01/23/2022   Anemia due to zinc deficiency 12/20/2021   Symptomatic anemia 12/15/2021   Cancer related pain 12/15/2021   Chronic heart failure with preserved ejection fraction (HFpEF) (HCC) 08/12/2021   Tinea cruris 08/12/2021   Anemia due to chronic kidney disease 07/20/2021   CKD (chronic kidney disease) stage 5, GFR less than 15 ml/min (HCC) 05/24/2021   BPH (benign prostatic hyperplasia) 05/24/2021   Senile nuclear sclerosis 05/20/2021   Vitamin D deficiency 05/20/2021   Vitreous degeneration, unspecified eye 05/20/2021   Intrinsic eczema 04/18/2021   Atherosclerosis of aorta (HCC) 12/30/2020   Mixed conductive and sensorineural hearing loss of left ear with restricted hearing of right ear 09/01/2020   Metastatic renal cell carcinoma (HCC) 02/26/2020   Diabetic polyneuropathy associated with type 2 diabetes mellitus (HCC) 12/18/2019   Murmur, cardiac 10/08/2018   DDD (degenerative disc disease), cervical 10/08/2018   OAB (overactive bladder) 04/04/2018   Seasonal allergic rhinitis due to pollen 11/01/2016   Post traumatic  stress disorder (PTSD) 01/29/2014   Spinal stenosis of lumbar region with radiculopathy 10/30/2011    Class: Chronic   Obstructive sleep apnea 11/24/2010   Cognitive impairment 11/24/2010   ADENOCARCINOMA, PROSTATE, GLEASON GRADE 6 12/21/2009   Essential hypertension 03/16/2007   GERD 03/16/2007    ONSET DATE: 08/25/2022 (referral)   REFERRING DIAG:  I63.9 (ICD-10-CM) - Cerebrovascular accident (CVA), unspecified mechanism    THERAPY DIAG:  Muscle weakness (generalized)  Unsteadiness on feet  Other abnormalities of gait and mobility  Other lack of coordination  Rationale for Evaluation and Treatment: Rehabilitation  SUBJECTIVE:  SUBJECTIVE STATEMENT:  Patient states he took BP meds this morning and is feeling fine.  Denies falls or near falls.  Presents to session using SPC.  Pt accompanied by: self and significant other - fiance [name]  PERTINENT HISTORY:  Patient was discharged from this setting on 3/7 but returned to the ED on 4/11 due to acute onset of dizziness. Patient found to have an acute left parietal-occipital perventricular infarcts in region of prior infarct.   PMH: ESRD not on HD, renal cancer metastatic to liver, adrenals and lymph nodes no longer on chemo, GERD, L PCA infarct November 2023, DM2, and anemia.  PAIN:  Are you having pain? No  PRECAUTIONS: Fall - R hemibody  WEIGHT BEARING RESTRICTIONS: No  FALLS: Has patient fallen in last 6 months? Yes. Number of falls 1 at time of initial stroke  PATIENT GOALS: "To step over curb without losing my balance."   OBJECTIVE:   DIAGNOSTIC FINDINGS:   MR BRAIN WO IMPRESSION on 08/24/2022: Acute left parieto-occipital periventricular infarcts in the region of prior infarct.  COGNITION: Overall cognitive status:  Impaired   TODAY'S TREATMENT:                                                                                                                               Vitals:   10/03/22 1023  BP: (!) 144/92  Pulse: 86    TherAct:  Education on increasing steps using [employer] App with patient/caregiver.  Patient arrives to session with only 81 steps today. Arrives to session with range of steps per day over last 7 days of [phone].  Maintained goal of 1,000 steps per day to promote consistency as only 2 days met goal last week.  NMR: Obstacle course using 3 x 4" hurdles over compliant blue mat surface and lateral step taps to gumdrops; PT noted increased difficulty w/ right taps due to lack of adequate scanning compared to left side Visual scanning during ambulation x345' w/ SPC and SBA-CGA to retrieve cones from gym over level surface and up and down 4 x 6" stairs using reciprocal pattern, rail, and SPC SBA; pt requires increased auditory cuing (started w/ color of cones to scan for > sounds to promote head turn > cued to area to look > adjusted patient visual field by turning trunk > leading to cone) and eventual guidance to 3/8 cones that he was unable to locate independently at eye level Standing on foam beam w/ toes slightly off front edge, alternating LE to tap 3 x 8" cones in a line x10 each LE for pivot in SLS Anteriorly oriented tilt board unsupported standing w/ multidirectional perturbations, pt displays more hip strategy than would be expected for size of perturbation, but only has 3 posterior LOB w/ minA to recover from anterior perturbations  Seated rest required between tasks due to LE being tired.  PATIENT EDUCATION: Education details: Steps goal 1,000 continued, continue HEP.  Reminder of  next appt. Person educated: Patient and Caregiver fiance Education method: Explanation Education comprehension: verbalized understanding and needs further education  HOME EXERCISE  PROGRAM:  HEP provided at D/C at last PT visit; recommend to continue at this time:   Access Code: [email or web address] URL: [email or web address] Date: 09/12/2022 Prepared by: Camille Bal  Exercises - Corner Balance Feet Together With Eyes Closed  - 1 x daily - 5 x weekly - 1 sets - 2-3 reps - 30-45 seconds hold - Sit to Stand with Arms Crossed  - 1 x daily - 5 x weekly - 1-2 sets - 8 reps - Seated Hamstring Stretch  - 1 x daily - 5 x weekly - 1 sets - 2-3 reps - 45 seconds hold - Standing Tandem Balance with Counter Support  - 1 x daily - 5 x weekly - 1 sets - 2 reps - 45 seconds hold - Standing Single Leg Stance with Counter Support  - 1 x daily - 5 x weekly - 1 sets - 2 reps - 45 seconds hold   You Can Walk For A Certain Length Of Time Each Day                          Walk 2-3 minutes or 250 steps 4-5 times per day.             Increase 1-2 minutes or 50 steps  every 7 days              Work up to 15 minutes or 700 steps (1-2 times per day).               Example:                         Day 1-2           4-5 minutes     3 times per day                         Day 7-8           10-12 minutes 2-3 times per day                         Day 13-14       20-22 minutes 1-2 times per day  GOALS: Goals reviewed with patient? Yes  SHORT TERM GOALS: Target date: 09/26/2022  Patient will demonstrate 100% compliance with initial HEP to continue to progress between physical therapy sessions.   Baseline: Continue with HEP provided at last PT D/C (still appropriate); progressing well and progressing towards step goal target Goal status: IN PROGRESS  2.  Patient will improve 5x Sit to Stand score to 32" or less with minimal amount of UE use necessary to demonstrate a minimal clinically important difference towards a decreased  risk for falls and improved lower extremity strength.   Baseline: 37.34" with mix of thigh assist and UE support, one forward LOB; improved to 21.91" with UE  use Goal status: MET  3.  Patient will improve TUG score to 15" or less with LRAD and no signs of imbalance to indicate a decreased risk of falls and demonstrate improved overall mobility.   Baseline: 15.23" without SPC with increased signs of instability, 19.58" with SPC (SBA); 13.5" with SPC  Goal status: MET  4.  Patient will improve gait speed to 0.7  m/s or greater with LRAD to indicate progress towards a decreased risk for falls.   Baseline: 0.60 m/s or 1.97 ft/sec with SPC (SBA); 0.81 m/s with SPC (2.66 ft/sec)  Goal status: MET  5.  Berg balance test to be assessed/goal written as indicated Baseline: Assessed Goal status: Discontinued STG due to level of baseline score  6.  Patient will ascend/descend curb with SBA and LRAD in order to more easily access community while shopping with fiance.  Baseline: CGA with SPC, SBA-CGA with SPC Goal status: Progressing  LONG TERM GOALS: Target date: 10/24/2022  Patient will report demonstrate independence with final HEP in order to maintain current gains and continue to progress after physical therapy discharge.   Baseline: To be provided Goal status: INITIAL  2.   Patient will improve 5x Sit to Stand score to 27" or less with minimal amount of UE use necessary to demonstrate progress towards a decreased  risk for falls and improved lower extremity strength.   Baseline: 37.34" with mix of thigh assist and UE support, one forward LOB ; one forward LOB; improved to 21.91" with UE use Goal status: IN PROGRESS  3.  Patient will improve TUG score to 13.5" or less with LRAD and no signs of imbalance to indicate a decreased risk of falls and demonstrate improved overall mobility.   Baseline: 15.23" without SPC with increased signs of instability, 19.58; 13.5" with SPC  Goal status: MET  4.  Patient will improve gait speed to 0.85 m/s to indicate improvement to the level of community ambulator in order to participate more easily in activities  outside of the home.   Baseline: 0.60 m/s or 1.97 ft/sec with SPC (SBA); 0.81 m/s with SPC (2.66 ft/sec)  Goal status: REVISED - patient met initial goal on 09/26/2022  5.  Patient will improve Berg Balance score by 5 points to indicate clinically significant progress towards a decreased risk of falls and improved static stability.   Baseline: 41/56 Goal status: INITIAL  6.  Patient will ascend/descend curb with modI and LRAD in order to more easily access community while shopping with fiance.  Baseline: CGA with SPC; CGA with SPC Goal status: IN PROGRESS  7.  Patient will improve FOTO score to 69 to achieve predicted improvements in functional mobility due to skilled physical therapy interventions to increase safety with and participation in daily activities. Baseline: 56 Goal status: INITIAL  ASSESSMENT:  CLINICAL IMPRESSION: Patient is progressing well; he demonstrates improved cardiorespiratory endurance as indicated by improving by nearly 1.48x last measured distance. Patient also tolerates multidirectional gait well. Greatest difficulty with toe taps to cones with multiple errors knocking over cones due to increased difficulty with modified single leg stance stability. Patient also demonstrates improved carryover with sit to stand transfers discussed last session. He continues to benefit from skilled PT to address ongoing functional weakness, gait deficits, and dynamic stability.   OBJECTIVE IMPAIRMENTS: Abnormal gait, decreased activity tolerance, decreased balance, decreased cognition, decreased endurance, decreased knowledge of condition, decreased knowledge of use of DME, decreased mobility, difficulty walking, decreased ROM, decreased strength, decreased safety awareness, impaired tone, and impaired vision/preception.   ACTIVITY LIMITATIONS: carrying, bending, standing, squatting, transfers, and locomotion level  PARTICIPATION LIMITATIONS: laundry, driving, community activity,  and yard work  PERSONAL FACTORS: Age, Past/current experiences, and 3+ comorbidities: see above  are also affecting patient's functional outcome.   REHAB POTENTIAL: Good  CLINICAL DECISION MAKING: Evolving/moderate complexity  EVALUATION COMPLEXITY: Moderate  PLAN:  PT FREQUENCY:  2x/week  PT DURATION: 8 weeks  PLANNED INTERVENTIONS: Therapeutic exercises, Therapeutic activity, Neuromuscular re-education, Balance training, Gait training, Patient/Family education, Self Care, Joint mobilization, Manual therapy, and Re-evaluation  PLAN FOR NEXT SESSION: update HEP as needed, high intensity gait training, how is walking program and updates to HEP?, hip strengthening, dynamic balance tasks, assess step count with app and modify goal as needed, work on foot clearance activities and modified SLS, is pt being discharged at goal assessment 1/61 or cert date and last scheduled appt 6/25?  Sadie Haber, PT, DPT 10/03/2022, 11:05 AM

## 2022-10-04 ENCOUNTER — Telehealth: Payer: Self-pay | Admitting: Internal Medicine

## 2022-10-04 NOTE — Telephone Encounter (Signed)
Called patient regarding upcoming June appointments, left a voicemail. 

## 2022-10-05 ENCOUNTER — Encounter: Payer: Self-pay | Admitting: Physical Therapy

## 2022-10-05 ENCOUNTER — Telehealth: Payer: Self-pay | Admitting: Physical Therapy

## 2022-10-05 ENCOUNTER — Ambulatory Visit: Payer: Medicare Other | Admitting: Physical Therapy

## 2022-10-05 VITALS — BP 152/78 | HR 72

## 2022-10-05 DIAGNOSIS — R2681 Unsteadiness on feet: Secondary | ICD-10-CM | POA: Diagnosis not present

## 2022-10-05 DIAGNOSIS — R278 Other lack of coordination: Secondary | ICD-10-CM | POA: Diagnosis not present

## 2022-10-05 DIAGNOSIS — R2689 Other abnormalities of gait and mobility: Secondary | ICD-10-CM

## 2022-10-05 DIAGNOSIS — M6281 Muscle weakness (generalized): Secondary | ICD-10-CM

## 2022-10-05 NOTE — Therapy (Signed)
OUTPATIENT PHYSICAL THERAPY NEURO TREATMENT   Patient Name: Antonio Adams MRN: [id number] DOB:Jun 01, 1945, 77 y.o., male Today's Date: 10/05/2022   PCP: Sanda Linger, MD REFERRING PROVIDER: Almon Hercules, MD  END OF SESSION:  PT End of Session - 10/05/22 1021     Visit Number 9    Number of Visits 17    Date for PT Re-Evaluation 11/07/22    Authorization Type [employer] Medicare    Progress Note Due on Visit 10    PT Start Time 1021    PT Stop Time 1102    PT Time Calculation (min) 41 min    Equipment Utilized During Treatment Gait belt    Activity Tolerance Patient tolerated treatment well    Behavior During Therapy WFL for tasks assessed/performed             Past Medical History:  Diagnosis Date   ADENOCARCINOMA, PROSTATE, GLEASON GRADE 6 12/21/2009   prostate cancer   ALLERGIC RHINITIS 03/16/2007   Allergy    Anxiety    Arthritis    back    BACK PAIN WITH RADICULOPATHY 11/23/2008   Cancer of kidney (HCC)    partial right kidney removed   CARPAL TUNNEL SYNDROME, BILATERAL 03/16/2007   Chronic back pain    Constipation    takes Senokot daily   Depression    occasionally   DIABETES MELLITUS, TYPE II 03/16/2007   takes Januvia and MEtformin daily   GASTROINTESTINAL HEMORRHAGE, HX OF 03/16/2007   GERD 03/16/2007   takes Omeprazole daily   Glaucoma    mild - no eye drops   Hemorrhoids    History of colon polyps    HYPERLIPIDEMIA 03/16/2007   takes Crestor daily   HYPERTENSION 03/16/2007   takes Diltiazem and Lisinopril daily    Kidney cancer, primary, with metastasis from kidney to other site [facility])    LEG CRAMPS 05/22/2007   Nocturia    Overactive bladder    PARESTHESIA 05/21/2007   Proctitis    PTSD (post-traumatic stress disorder)    wakes up may be dreaming of fighting   Rectal bleeding    Dr.Norins has explained its from the Radiation that he has received   Rectal bleeding    Renal cell carcinoma (HCC)    Sleep apnea    uses CPAP nightly   Stomach  cancer (HCC)    Urinary frequency    takes Toviaz daily   Past Surgical History:  Procedure Laterality Date   ADRENALECTOMY Right 02/26/2020   Procedure: OPEN RIGHT RENAL CELL METASTATECTOMY AND  ADRENALECTOMY;  Surgeon: Crist Fat, MD;  Location: [facility];  Service: Urology;  Laterality: Right;   BACK SURGERY  1983, 2008   repeat surgery; [facility]SI '08   CARPAL TUNNEL RELEASE     bilateral   CERVICAL FUSION     COLONOSCOPY     colonosocpy     ESOPHAGOGASTRODUODENOSCOPY     ED   FLEXIBLE SIGMOIDOSCOPY N/A 01/08/2017   Procedure: FLEXIBLE SIGMOIDOSCOPY;  Surgeon: Hilarie Fredrickson, MD;  Location[facility] ENDOSCOPY;  Service: Endoscopy;  Laterality: N/A;   HOT HEMOSTASIS N/A 01/08/2017   Procedure: HOT HEMOSTASIS (ARGON PLASMA COAGULATION/BICAP);  Surgeon: Hilarie Fredrickson, MD;  Location: [facility] ENDOSCOPY;  Service: Endoscopy;  Laterality: N/A;   IR RADIOLOGIST EVAL & MGMT  12/17/2019   IR RADIOLOGIST EVAL & MGMT  07/01/2020   IR RADIOLOGIST EVAL & MGMT  08/25/2020   LUMBAR LAMINECTOMY  10/30/2011   Procedure: MICRODISCECTOMY LUMBAR LAMINECTOMY;  Surgeon: Kerrin Champagne, MD;  Location: [facility] OR;  Service: Orthopedics;  Laterality: Right;  Right L4-5 and L5-S1 Microdiscectomy   LUMBAR LAMINECTOMY  06/17/2012   Procedure: MICRODISCECTOMY LUMBAR LAMINECTOMY;  Surgeon: Kerrin Champagne, MD;  Location: [facility] OR;  Service: Orthopedics;  Laterality: N/A;  Right L5-S1 microdiscectomy   OPEN PARTIAL HEPATECTOMY  N/A 02/26/2020   Procedure: OPEN PARTIAL HEPATECTOMY, CHOLECYSTECTOMY;  Surgeon: Almond Lint, MD;  Location: [facility];  Service: General;  Laterality: N/A;   POLYPECTOMY     PROSTATE SURGERY  march 2012   seed implant   RADIOFREQUENCY ABLATION N/A 07/21/2020   Procedure: CT MICROWAVE ABLATION;  Surgeon: Sterling Big, MD;  Location: [facility];  Service: Anesthesiology;  Laterality: N/A;   ROBOTIC ASSITED PARTIAL NEPHRECTOMY Right 09/08/2015   Procedure: XI ROBOTIC ASSITED RIGHT PARTIAL NEPHRECTOMY;  Surgeon: Crist Fat, MD;  Location: [facility];  Service: Urology;  Laterality: Right;  Clamp on: 1033 Clamp off: 1103 Total Clamp Time: 30 minutes   SHOULDER ARTHROSCOPY WITH SUBACROMIAL DECOMPRESSION Left 01/20/2016   Procedure: LEFT SHOULDER ARTHROSCOPY WITH EXTENSIVE  DEBRIDEMENT, ACROMIOPLASTY;  Surgeon: Loreta Ave, MD;  Location: [facility];  Service: Orthopedics;  Laterality: Left;   SHOULDER SURGERY  LFT   Stress Cardiolite  12/24/2003   negative for ischemia   UPPER ESOPHAGEAL ENDOSCOPIC ULTRASOUND (EUS)  04/18/2016   [facility]   UPPER GASTROINTESTINAL ENDOSCOPY     WOUND EXPLORATION     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   Patient Active Problem List   Diagnosis Date Noted   Traumatic coccydynia 08/30/2022   Acute ischemic stroke (HCC) 08/24/2022   Encounter for general adult medical examination with abnormal findings 01/23/2022   Anemia due to zinc deficiency 12/20/2021   Symptomatic anemia 12/15/2021   Cancer related pain 12/15/2021   Chronic heart failure with preserved ejection fraction (HFpEF) (HCC) 08/12/2021   Tinea cruris 08/12/2021   Anemia due to chronic kidney disease 07/20/2021   CKD (chronic kidney disease) stage 5, GFR less than 15 ml/min (HCC) 05/24/2021   BPH (benign prostatic hyperplasia) 05/24/2021   Senile nuclear sclerosis 05/20/2021   Vitamin D deficiency 05/20/2021   Vitreous degeneration, unspecified eye 05/20/2021   Intrinsic eczema 04/18/2021   Atherosclerosis of aorta (HCC) 12/30/2020   Mixed conductive and sensorineural hearing loss of left ear with restricted hearing of right ear 09/01/2020   Metastatic renal cell carcinoma (HCC) 02/26/2020   Diabetic polyneuropathy associated with type 2 diabetes mellitus (HCC) 12/18/2019   Murmur, cardiac 10/08/2018   DDD (degenerative disc disease), cervical 10/08/2018   OAB (overactive bladder) 04/04/2018   Seasonal allergic rhinitis due to pollen 11/01/2016   Post traumatic  stress disorder (PTSD) 01/29/2014   Spinal stenosis of lumbar region with radiculopathy 10/30/2011    Class: Chronic   Obstructive sleep apnea 11/24/2010   Cognitive impairment 11/24/2010   ADENOCARCINOMA, PROSTATE, GLEASON GRADE 6 12/21/2009   Essential hypertension 03/16/2007   GERD 03/16/2007    ONSET DATE: 08/25/2022 (referral)   REFERRING DIAG:  I63.9 (ICD-10-CM) - Cerebrovascular accident (CVA), unspecified mechanism    THERAPY DIAG:  Muscle weakness (generalized)  Other abnormalities of gait and mobility  Unsteadiness on feet  Rationale for Evaluation and Treatment: Rehabilitation  SUBJECTIVE:  SUBJECTIVE STATEMENT:  Patient reports no acute changes. Denies falls.  Presents to session using SPC. Per chart review, [facility] but they are not pursuing at this time. Caregiver reports she is unsure if cognition hasn't gotten worse over recent months. Family also interested in pursuing speech therapy again.   Pt accompanied by: self and significant other - fiance [name]  PERTINENT HISTORY:  Patient was discharged from this setting on 3/7 but returned to the ED on 4/11 due to acute onset of dizziness. Patient found to have an acute left parietal-occipital perventricular infarcts in region of prior infarct.   PMH: ESRD not on HD, renal cancer metastatic to liver, adrenals and lymph nodes no longer on chemo, GERD, L PCA infarct November 2023, DM2, and anemia.  PAIN:  Are you having pain? No  PRECAUTIONS: Fall - R hemibody  WEIGHT BEARING RESTRICTIONS: No  FALLS: Has patient fallen in last 6 months? Yes. Number of falls 1 at time of initial stroke  PATIENT GOALS: "To step over curb without losing my balance."   OBJECTIVE:   DIAGNOSTIC FINDINGS:   MR  BRAIN WO IMPRESSION on 08/24/2022: Acute left parieto-occipital periventricular infarcts in the region of prior infarct.  COGNITION: Overall cognitive status: Impaired   TODAY'S TREATMENT:                                                                                                                               Vitals:   10/05/22 1031  BP: (!) 152/78  Pulse: 72   TherAct:  Education on increasing steps using [employer] App with patient/caregiver.  Patient arrives to session with only 311 steps today. Arrives to session with range of steps per day over last 2 days of 1000+ to 805. Will keep step goal at 1,000.  Discussed increased cognitive challenges noted during session. Educated caregiver on need to frequently remind patient to look to his R as memory retention/safety awareness as much of a challenge as visual deficits.   NMR: Blaze pod taps self selected UE/LE along mirrored wall with fwd/backward gait with all blaze pods on the R (CGA) Taps: 4 Taps: 8 Taps: 9 Increased difficulty recalling to walk backward/forward and to fully scan visual field required modA for tactile and visual cues Blaze pod taps with color to indicate L or R side selected UE/LE along mirrored wall with fwd/backward gait with all blaze pods on the R (CGA) Taps: 12 (8 errors) Taps: 17 (10 errors) Taps: 19 (8 errors) Required modA for tactile and visual cues  Seated rest required between tasks due to LE fatigue.  PATIENT EDUCATION: Education details: Continue HEP Person educated: Patient and Comptroller Education method: Explanation Education comprehension: verbalized understanding and needs further education  HOME EXERCISE PROGRAM:  HEP provided at D/C at last PT visit; recommend to continue at this time:   Access Code: [email or web address] URL: [email or web address] Date: 09/12/2022 Prepared by: Camille Bal  Exercises - Corner Balance Feet Together With Eyes Closed  - 1 x  daily - 5 x weekly - 1 sets - 2-3 reps - 30-45 seconds hold - Sit to Stand with Arms Crossed  - 1 x daily - 5 x weekly - 1-2 sets - 8 reps - Seated Hamstring Stretch  - 1 x daily - 5 x weekly - 1 sets - 2-3 reps - 45 seconds hold - Standing Tandem Balance with Counter Support  - 1 x daily - 5 x weekly - 1 sets - 2 reps - 45 seconds hold - Standing Single Leg Stance with Counter Support  - 1 x daily - 5 x weekly - 1 sets - 2 reps - 45 seconds hold   You Can Walk For A Certain Length Of Time Each Day                          Walk 2-3 minutes or 250 steps 4-5 times per day.             Increase 1-2 minutes or 50 steps  every 7 days              Work up to 15 minutes or 700 steps (1-2 times per day).               Example:                         Day 1-2           4-5 minutes     3 times per day                         Day 7-8           10-12 minutes 2-3 times per day                         Day 13-14       20-22 minutes 1-2 times per day  GOALS: Goals reviewed with patient? Yes  SHORT TERM GOALS: Target date: 09/26/2022  Patient will demonstrate 100% compliance with initial HEP to continue to progress between physical therapy sessions.   Baseline: Continue with HEP provided at last PT D/C (still appropriate); progressing well and progressing towards step goal target Goal status: IN PROGRESS  2.  Patient will improve 5x Sit to Stand score to 32" or less with minimal amount of UE use necessary to demonstrate a minimal clinically important difference towards a decreased  risk for falls and improved lower extremity strength.   Baseline: 37.34" with mix of thigh assist and UE support, one forward LOB; improved to 21.91" with UE use Goal status: MET  3.  Patient will improve TUG score to 15" or less with LRAD and no signs of imbalance to indicate a decreased risk of falls and demonstrate improved overall mobility.   Baseline: 15.23" without SPC with increased signs of instability, 19.58" with  SPC (SBA); 13.5" with SPC  Goal status: MET  4.  Patient will improve gait speed to 0.7 m/s or greater with LRAD to indicate progress towards a decreased risk for falls.   Baseline: 0.60 m/s or 1.97 ft/sec with SPC (SBA); 0.81 m/s with SPC (2.66 ft/sec)  Goal status: MET  5.  Berg balance test to be assessed/goal written as indicated Baseline: Assessed Goal status: Discontinued  STG due to level of baseline score  6.  Patient will ascend/descend curb with SBA and LRAD in order to more easily access community while shopping with fiance.  Baseline: CGA with SPC, SBA-CGA with SPC Goal status: Progressing  LONG TERM GOALS: Target date: 10/24/2022  Patient will report demonstrate independence with final HEP in order to maintain current gains and continue to progress after physical therapy discharge.   Baseline: To be provided Goal status: INITIAL  2.   Patient will improve 5x Sit to Stand score to 27" or less with minimal amount of UE use necessary to demonstrate progress towards a decreased  risk for falls and improved lower extremity strength.   Baseline: 37.34" with mix of thigh assist and UE support, one forward LOB ; one forward LOB; improved to 21.91" with UE use Goal status: IN PROGRESS  3.  Patient will improve TUG score to 13.5" or less with LRAD and no signs of imbalance to indicate a decreased risk of falls and demonstrate improved overall mobility.   Baseline: 15.23" without SPC with increased signs of instability, 19.58; 13.5" with SPC  Goal status: MET  4.  Patient will improve gait speed to 0.85 m/s to indicate improvement to the level of community ambulator in order to participate more easily in activities outside of the home.   Baseline: 0.60 m/s or 1.97 ft/sec with SPC (SBA); 0.81 m/s with SPC (2.66 ft/sec)  Goal status: REVISED - patient met initial goal on 09/26/2022  5.  Patient will improve Berg Balance score by 5 points to indicate clinically significant progress  towards a decreased risk of falls and improved static stability.   Baseline: 41/56 Goal status: INITIAL  6.  Patient will ascend/descend curb with modI and LRAD in order to more easily access community while shopping with fiance.  Baseline: CGA with SPC; CGA with SPC Goal status: IN PROGRESS  7.  Patient will improve FOTO score to 69 to achieve predicted improvements in functional mobility due to skilled physical therapy interventions to increase safety with and participation in daily activities. Baseline: 56 Goal status: INITIAL  ASSESSMENT:  CLINICAL IMPRESSION: Patient continues to show significant cognitive deficits impacting session. While visual field scanning to the R certainly is at play patient demonstrates reduced safety awareness and retention of what task is to be performed in therapy. Patient required frequent prompting in order to continue task. Educated caregiver on safety measures as noted above. Will send request to referring physician about speech referral as well. Will continue to address deficits as able per POC.  OBJECTIVE IMPAIRMENTS: Abnormal gait, decreased activity tolerance, decreased balance, decreased cognition, decreased endurance, decreased knowledge of condition, decreased knowledge of use of DME, decreased mobility, difficulty walking, decreased ROM, decreased strength, decreased safety awareness, impaired tone, and impaired vision/preception.   ACTIVITY LIMITATIONS: carrying, bending, standing, squatting, transfers, and locomotion level  PARTICIPATION LIMITATIONS: laundry, driving, community activity, and yard work  PERSONAL FACTORS: Age, Past/current experiences, and 3+ comorbidities: see above  are also affecting patient's functional outcome.   REHAB POTENTIAL: Good  CLINICAL DECISION MAKING: Evolving/moderate complexity  EVALUATION COMPLEXITY: Moderate  PLAN:  PT FREQUENCY: 2x/week  PT DURATION: 8 weeks  PLANNED INTERVENTIONS: Therapeutic  exercises, Therapeutic activity, Neuromuscular re-education, Balance training, Gait training, Patient/Family education, Self Care, Joint mobilization, Manual therapy, and Re-evaluation  PLAN FOR NEXT SESSION: update HEP as needed, high intensity gait training, how is walking program and updates to HEP?, hip strengthening, dynamic balance tasks, assess step count with  app and modify goal as needed, work on foot clearance activities and modified SLS, Plan to D/C at 6/11 or next session if lack of progress noted on 10th visit progress note (appointments past cert date deleted at this time)  Carmelia Bake, PT, DPT 10/05/2022, 12:18 PM

## 2022-10-05 NOTE — Telephone Encounter (Signed)
Dr. Yetta Barre,  Zaidan Eschbach was evaluated by physical therapy on 08/29/2022.  The patient would benefit from speech therapy evaluation for ongoing memory complaints that are impacting both therapy sessions and life outside of physical therapy.    If you agree, please place an order in [facility] workque in [facility] or fax the order to [phone]. Thank you, Maryruth Eve, PT, DPT   [facility] [address] [place], [place]  [address] Phone:  [phone] Fax:  [phone]

## 2022-10-10 ENCOUNTER — Ambulatory Visit: Payer: Medicare Other | Admitting: Physical Therapy

## 2022-10-12 ENCOUNTER — Ambulatory Visit: Payer: Medicare Other | Admitting: Physical Therapy

## 2022-10-12 DIAGNOSIS — I639 Cerebral infarction, unspecified: Secondary | ICD-10-CM | POA: Diagnosis not present

## 2022-10-12 DIAGNOSIS — N185 Chronic kidney disease, stage 5: Secondary | ICD-10-CM | POA: Diagnosis not present

## 2022-10-12 DIAGNOSIS — I120 Hypertensive chronic kidney disease with stage 5 chronic kidney disease or end stage renal disease: Secondary | ICD-10-CM | POA: Diagnosis not present

## 2022-10-12 DIAGNOSIS — N2581 Secondary hyperparathyroidism of renal origin: Secondary | ICD-10-CM | POA: Diagnosis not present

## 2022-10-12 DIAGNOSIS — E785 Hyperlipidemia, unspecified: Secondary | ICD-10-CM | POA: Diagnosis not present

## 2022-10-12 DIAGNOSIS — D631 Anemia in chronic kidney disease: Secondary | ICD-10-CM | POA: Diagnosis not present

## 2022-10-12 DIAGNOSIS — C649 Malignant neoplasm of unspecified kidney, except renal pelvis: Secondary | ICD-10-CM | POA: Diagnosis not present

## 2022-10-12 DIAGNOSIS — E875 Hyperkalemia: Secondary | ICD-10-CM | POA: Diagnosis not present

## 2022-10-12 DIAGNOSIS — E1122 Type 2 diabetes mellitus with diabetic chronic kidney disease: Secondary | ICD-10-CM | POA: Diagnosis not present

## 2022-10-14 ENCOUNTER — Other Ambulatory Visit: Payer: Self-pay | Admitting: Internal Medicine

## 2022-10-14 DIAGNOSIS — I639 Cerebral infarction, unspecified: Secondary | ICD-10-CM

## 2022-10-16 ENCOUNTER — Encounter (HOSPITAL_COMMUNITY)
Admission: RE | Admit: 2022-10-16 | Discharge: 2022-10-16 | Disposition: A | Discharge status: Home / Self Care | Payer: Medicare Other | Source: Ambulatory Visit | Attending: Nephrology | Admitting: Nephrology

## 2022-10-16 VITALS — BP 160/94 | HR 66 | Temp 97.0°F | Resp 17

## 2022-10-16 DIAGNOSIS — N185 Chronic kidney disease, stage 5: Secondary | ICD-10-CM | POA: Insufficient documentation

## 2022-10-16 LAB — POCT HEMOGLOBIN-HEMACUE: Hemoglobin: 10 g/dL — ABNORMAL LOW (ref 13.0–17.0)

## 2022-10-16 MED ORDER — EPOETIN ALFA-EPBX 40000 UNIT/ML IJ SOLN
30000.0000 [IU] | INTRAMUSCULAR | Status: DC
  Administered 2022-10-16: 30000 [IU] via SUBCUTANEOUS

## 2022-10-16 MED ORDER — EPOETIN ALFA-EPBX 40000 UNIT/ML IJ SOLN
INTRAMUSCULAR | Status: AC
  Filled 2022-10-16: qty 1

## 2022-10-17 ENCOUNTER — Encounter: Payer: Self-pay | Admitting: Physical Therapy

## 2022-10-17 ENCOUNTER — Ambulatory Visit: Payer: Medicare Other | Attending: Internal Medicine | Admitting: Physical Therapy

## 2022-10-17 VITALS — BP 137/73 | HR 71

## 2022-10-17 DIAGNOSIS — R2689 Other abnormalities of gait and mobility: Secondary | ICD-10-CM | POA: Insufficient documentation

## 2022-10-17 DIAGNOSIS — R41841 Cognitive communication deficit: Secondary | ICD-10-CM | POA: Insufficient documentation

## 2022-10-17 DIAGNOSIS — M6281 Muscle weakness (generalized): Secondary | ICD-10-CM | POA: Insufficient documentation

## 2022-10-17 DIAGNOSIS — R471 Dysarthria and anarthria: Secondary | ICD-10-CM | POA: Diagnosis not present

## 2022-10-17 DIAGNOSIS — R2681 Unsteadiness on feet: Secondary | ICD-10-CM | POA: Insufficient documentation

## 2022-10-17 NOTE — Therapy (Signed)
OUTPATIENT PHYSICAL THERAPY NEURO TREATMENT/10th VISIT PN    Patient Name: Antonio Adams MRN: [id number] DOB:04-10-1946, 77 y.o., male Today's Date: 10/17/2022   PCP: Sanda Linger, MD REFERRING PROVIDER: Almon Hercules, MD  10th Visit Physical Therapy Progress Note  Dates of Reporting Period: 08/29/22 to 10/17/22    END OF SESSION:  PT End of Session - 10/17/22 1010     Visit Number 10    Number of Visits 17    Date for PT Re-Evaluation 11/07/22    Authorization Type [employer]    Progress Note Due on Visit 10    PT Start Time 1008    PT Stop Time 1051    PT Time Calculation (min) 43 min    Equipment Utilized During Treatment Gait belt    Activity Tolerance Patient tolerated treatment well    Behavior During Therapy WFL for tasks assessed/performed              Past Medical History:  Diagnosis Date   ADENOCARCINOMA, PROSTATE, GLEASON GRADE 6 12/21/2009   prostate cancer   ALLERGIC RHINITIS 03/16/2007   Allergy    Anxiety    Arthritis    back    BACK PAIN WITH RADICULOPATHY 11/23/2008   Cancer of kidney (HCC)    partial right kidney removed   CARPAL TUNNEL SYNDROME, BILATERAL 03/16/2007   Chronic back pain    Constipation    takes Senokot daily   Depression    occasionally   DIABETES MELLITUS, TYPE II 03/16/2007   takes Januvia and MEtformin daily   GASTROINTESTINAL HEMORRHAGE, HX OF 03/16/2007   GERD 03/16/2007   takes Omeprazole daily   Glaucoma    mild - no eye drops   Hemorrhoids    History of colon polyps    HYPERLIPIDEMIA 03/16/2007   takes Crestor daily   HYPERTENSION 03/16/2007   takes Diltiazem and Lisinopril daily    Kidney cancer, primary, with metastasis from kidney to other site [facility])    LEG CRAMPS 05/22/2007   Nocturia    Overactive bladder    PARESTHESIA 05/21/2007   Proctitis    PTSD (post-traumatic stress disorder)    wakes up may be dreaming of fighting   Rectal bleeding    Dr.Norins has explained its from the Radiation that he has  received   Rectal bleeding    Renal cell carcinoma (HCC)    Sleep apnea    uses CPAP nightly   Stomach cancer (HCC)    Urinary frequency    takes Toviaz daily   Past Surgical History:  Procedure Laterality Date   ADRENALECTOMY Right 02/26/2020   Procedure: OPEN RIGHT RENAL CELL METASTATECTOMY AND  ADRENALECTOMY;  Surgeon: Crist Fat, MD;  Location: [facility];  Service: Urology;  Laterality: Right;   BACK SURGERY  1983, 2008   repeat surgery; [facility] '08   CARPAL TUNNEL RELEASE     bilateral   CERVICAL FUSION     COLONOSCOPY     colonosocpy     ESOPHAGOGASTRODUODENOSCOPY     ED   FLEXIBLE SIGMOIDOSCOPY N/A 01/08/2017   Procedure: FLEXIBLE SIGMOIDOSCOPY;  Surgeon: Hilarie Fredrickson, MD;  Location: [facility] END[facility]COPY;  Service: Endoscopy;  Laterality: N/A;   HOT HEMOSTASIS N/A 01/08/2017   Procedure: HOT HEMOSTASIS (ARGON PLASMA COAGULATION/BICAP);  Surgeon: Hilarie Fredrickson, MD;  Location: [facility] ENDOSCOPY;  Service: Endoscopy;  Laterality: N/A;   IR RADIOLOGIST EVAL & MGMT  12/17/2019   IR RADIOLOGIST EVAL & MGMT  07/01/2020  IR RADIOLOGIST EVAL & MGMT  08/25/2020   LUMBAR LAMINECTOMY  10/30/2011   Procedure: MICRODISCECTOMY LUMBAR LAMINECTOMY;  Surgeon: Kerrin Champagne, MD;  Location: [facility] OR;  Service: Orthopedics;  Laterality: Right;  Right L4-5 and L5-S1 Microdiscectomy   LUMBAR LAMINECTOMY  06/17/2012   Procedure: MICRODISCECTOMY LUMBAR LAMINECTOMY;  Surgeon: Kerrin Champagne, MD;  Location: [facility] OR;  Service: Orthopedics;  Laterality: N/A;  Right L5-S1 microdiscectomy   OPEN PARTIAL HEPATECTOMY  N/A 02/26/2020   Procedure: OPEN PARTIAL HEPATECTOMY, CHOLECYSTECTOMY;  Surgeon: Almond Lint, MD;  Location: [facility];  Service: General;  Laterality: N/A;   POLYPECTOMY     PROSTATE SURGERY  march 2012   seed implant   RADIOFREQUENCY ABLATION N/A 07/21/2020   Procedure: CT MICROWAVE ABLATION;  Surgeon: Sterling Big, MD;  Location: [facility];  Service: Anesthesiology;  Laterality: N/A;   ROBOTIC ASSITED  PARTIAL NEPHRECTOMY Right 09/08/2015   Procedure: XI ROBOTIC ASSITED RIGHT PARTIAL NEPHRECTOMY;  Surgeon: Crist Fat, MD;  Location: [facility];  Service: Urology;  Laterality: Right;  Clamp on: 1033 Clamp off: 1103 Total Clamp Time: 30 minutes   SHOULDER ARTHROSCOPY WITH SUBACROMIAL DECOMPRESSION Left 01/20/2016   Procedure: LEFT SHOULDER ARTHROSCOPY WITH EXTENSIVE  DEBRIDEMENT, ACROMIOPLASTY;  Surgeon: Loreta Ave, MD;  Location: [facility];  Service: Orthopedics;  Laterality: Left;   SHOULDER SURGERY  LFT   Stress Cardiolite  12/24/2003   negative for ischemia   UPPER ESOPHAGEAL ENDOSCOPIC ULTRASOUND (EUS)  04/18/2016   [facility]   UPPER GASTROINTESTINAL ENDOSCOPY     WOUND EXPLORATION     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   Patient Active Problem List   Diagnosis Date Noted   Traumatic coccydynia 08/30/2022   Acute ischemic stroke (HCC) 08/24/2022   Encounter for general adult medical examination with abnormal findings 01/23/2022   Anemia due to zinc deficiency 12/20/2021   Symptomatic anemia 12/15/2021   Cancer related pain 12/15/2021   Chronic heart failure with preserved ejection fraction (HFpEF) (HCC) 08/12/2021   Tinea cruris 08/12/2021   Anemia due to chronic kidney disease 07/20/2021   CKD (chronic kidney disease) stage 5, GFR less than 15 ml/min (HCC) 05/24/2021   BPH (benign prostatic hyperplasia) 05/24/2021   Senile nuclear sclerosis 05/20/2021   Vitamin D deficiency 05/20/2021   Vitreous degeneration, unspecified eye 05/20/2021   Intrinsic eczema 04/18/2021   Atherosclerosis of aorta (HCC) 12/30/2020   Mixed conductive and sensorineural hearing loss of left ear with restricted hearing of right ear 09/01/2020   Metastatic renal cell carcinoma (HCC) 02/26/2020   Diabetic polyneuropathy associated with type 2 diabetes mellitus (HCC) 12/18/2019   Murmur, cardiac 10/08/2018   DDD (degenerative disc disease), cervical  10/08/2018   OAB (overactive bladder) 04/04/2018   Seasonal allergic rhinitis due to pollen 11/01/2016   Post traumatic stress disorder (PTSD) 01/29/2014   Spinal stenosis of lumbar region with radiculopathy 10/30/2011    Class: Chronic   Obstructive sleep apnea 11/24/2010   Cognitive impairment 11/24/2010   ADENOCARCINOMA, PROSTATE, GLEASON GRADE 6 12/21/2009   Essential hypertension 03/16/2007   GERD 03/16/2007    ONSET DATE: 08/25/2022 (referral)   REFERRING DIAG:  I63.9 (ICD-10-CM) - Cerebrovascular accident (CVA), unspecified mechanism    THERAPY DIAG:  Muscle weakness (generalized)  Other abnormalities of gait and mobility  Unsteadiness on feet  Rationale for Evaluation and Treatment: Rehabilitation  SUBJECTIVE:  SUBJECTIVE STATEMENT:  No falls, no changes since he was last here. Reports back has been bothering him some, exercises are going slow at home. Pt's fiance reports he still has a hard time with getting up from surfaces.   Pt accompanied by: self and significant other - fiance [name]  PERTINENT HISTORY:  Patient was discharged from this setting on 3/7 but returned to the ED on 4/11 due to acute onset of dizziness. Patient found to have an acute left parietal-occipital perventricular infarcts in region of prior infarct.   PMH: ESRD not on HD, renal cancer metastatic to liver, adrenals and lymph nodes no longer on chemo, GERD, L PCA infarct November 2023, DM2, and anemia.  PAIN:  Are you having pain? No  PRECAUTIONS: Fall - R hemibody  WEIGHT BEARING RESTRICTIONS: No  FALLS: Has patient fallen in last 6 months? Yes. Number of falls 1 at time of initial stroke  PATIENT GOALS: "To step over curb without losing my balance."   OBJECTIVE:   DIAGNOSTIC FINDINGS:   MR BRAIN  WO IMPRESSION on 08/24/2022: Acute left parieto-occipital periventricular infarcts in the region of prior infarct.  COGNITION: Overall cognitive status: Impaired   TODAY'S TREATMENT:                                                                                                                               Vitals:   10/17/22 1013  BP: 137/73  Pulse: 71    Therapeutic Activity: Goal Assessment: 5x sit <> stand: 17.5 seconds with no UE support  TUG: 16.5 seconds with SPC, 13.2 seconds with no AD Gait speed: 22 seconds first attempt with SPC = .45 m/s Pt requesting to perform again: 17.4 seconds 2nd attempt with a mixture of holding cane and placing it on ground =.57 m/s   NMR: 5 reps sit <> stands with no UE support  10 reps sit <> stand from elevated mat table on red balance beam for initial standing balance and BLE strengthening, pt sometimes using hands from mat and sometimes with no UE support. Min guard as needed in standing for balance and pt needing to hold onto chair at times. Cues to look straight ahead for posture and slowed pace for eccentric control back down to mat table. Cues for incr forward lean to decr BLE bracing against mat table.    In // bars: On air ex: Alternating lateral stepping strategy on and off, alternating legs, x10 reps each side, pt with incr difficulty stepping RLE back on, able to perform with no UE support  Alternating forward steps on and off, needing to use UE support x10 reps each side, pt more challenged in the forwards direction  Pt needing demo cues for proper orientation on direction where to stand on air ex  On level ground for single leg stance stability: Alternating toe taps to 2 cones x6 reps each side, pt needing to use UE support Alternating foot touches  to 4" step x10 reps each side with no UE support, cues for slowed pace as pt with tendency to move too quickly   PATIENT EDUCATION: Education details: Continue HEP, results of goals,  pt to see primary PT at next session and plan to D/C at that time  Person educated: Patient and Caregiver fiance Education method: Explanation Education comprehension: verbalized understanding and needs further education  HOME EXERCISE PROGRAM:  HEP provided at D/C at last PT visit; recommend to continue at this time:   Access Code: [email or web address] URL: [email or web address] Date: 09/12/2022 Prepared by: Camille Bal  Exercises - Corner Balance Feet Together With Eyes Closed  - 1 x daily - 5 x weekly - 1 sets - 2-3 reps - 30-45 seconds hold - Sit to Stand with Arms Crossed  - 1 x daily - 5 x weekly - 1-2 sets - 8 reps - Seated Hamstring Stretch  - 1 x daily - 5 x weekly - 1 sets - 2-3 reps - 45 seconds hold - Standing Tandem Balance with Counter Support  - 1 x daily - 5 x weekly - 1 sets - 2 reps - 45 seconds hold - Standing Single Leg Stance with Counter Support  - 1 x daily - 5 x weekly - 1 sets - 2 reps - 45 seconds hold   You Can Walk For A Certain Length Of Time Each Day                          Walk 2-3 minutes or 250 steps 4-5 times per day.             Increase 1-2 minutes or 50 steps  every 7 days              Work up to 15 minutes or 700 steps (1-2 times per day).               Example:                         Day 1-2           4-5 minutes     3 times per day                         Day 7-8           10-12 minutes 2-3 times per day                         Day 13-14       20-22 minutes 1-2 times per day  GOALS: Goals reviewed with patient? Yes  SHORT TERM GOALS: Target date: 09/26/2022  Patient will demonstrate 100% compliance with initial HEP to continue to progress between physical therapy sessions.   Baseline: Continue with HEP provided at last PT D/C (still appropriate); progressing well and progressing towards step goal target Goal status: IN PROGRESS  2.  Patient will improve 5x Sit to Stand score to 32" or less with minimal amount of UE use  necessary to demonstrate a minimal clinically important difference towards a decreased  risk for falls and improved lower extremity strength.   Baseline: 37.34" with mix of thigh assist and UE support, one forward LOB; improved to 21.91" with UE use Goal status: MET  3.  Patient will improve TUG score to 15" or less with LRAD and  no signs of imbalance to indicate a decreased risk of falls and demonstrate improved overall mobility.   Baseline: 15.23" without SPC with increased signs of instability, 19.58" with SPC (SBA); 13.5" with SPC  Goal status: MET  4.  Patient will improve gait speed to 0.7 m/s or greater with LRAD to indicate progress towards a decreased risk for falls.   Baseline: 0.60 m/s or 1.97 ft/sec with SPC (SBA); 0.81 m/s with SPC (2.66 ft/sec)  Goal status: MET  5.  Berg balance test to be assessed/goal written as indicated Baseline: Assessed Goal status: Discontinued STG due to level of baseline score  6.  Patient will ascend/descend curb with SBA and LRAD in order to more easily access community while shopping with fiance.  Baseline: CGA with SPC, SBA-CGA with SPC Goal status: Progressing  LONG TERM GOALS: Target date: 10/24/2022  Patient will report demonstrate independence with final HEP in order to maintain current gains and continue to progress after physical therapy discharge.   Baseline: To be provided Goal status: INITIAL  2.   Patient will improve 5x Sit to Stand score to 27" or less with minimal amount of UE use necessary to demonstrate progress towards a decreased  risk for falls and improved lower extremity strength.   Baseline: 37.34" with mix of thigh assist and UE support, one forward LOB ; one forward LOB; improved to 21.91" with UE use  17.5 seconds with no UE support on 10/17/22 Goal status: MET  3.  Patient will improve TUG score to 13.5" or less with LRAD and no signs of imbalance to indicate a decreased risk of falls and demonstrate improved overall  mobility.   Baseline: 15.23" without SPC with increased signs of instability, 19.58; 13.5" with SPC   13.2 seconds with no AD on 10/17/22 Goal status: MET  4.  Patient will improve gait speed to 0.85 m/s to indicate improvement to the level of community ambulator in order to participate more easily in activities outside of the home.   Baseline: 0.60 m/s or 1.97 ft/sec with SPC (SBA); 0.81 m/s with SPC (2.66 ft/sec)   .57 m/s on 10/17/22 Goal status: NOT MET - patient met initial goal on 09/26/2022  5.  Patient will improve Berg Balance score by 5 points to indicate clinically significant progress towards a decreased risk of falls and improved static stability.   Baseline: 41/56 Goal status: INITIAL  6.  Patient will ascend/descend curb with modI and LRAD in order to more easily access community while shopping with fiance.  Baseline: CGA with SPC; CGA with SPC Goal status: IN PROGRESS  7.  Patient will improve FOTO score to 69 to achieve predicted improvements in functional mobility due to skilled physical therapy interventions to increase safety with and participation in daily activities. Baseline: 56 Goal status: INITIAL  ASSESSMENT:  CLINICAL IMPRESSION: 10th visit PN: Began to assess pt's LTGs with pt meeting LTG #2 and #3. PT improved TUG time with no AD with improved balance and did decr time with 5x sit <> stand and able to perform without UE support. See LTGs above for more details. Pt with slower gait speed today with SPC, was .57 m/s (previously was .81 m/s), indicating a limited community ambulator. Remainder of session focused on standing balance tasks with focus on SLS and foot clearance. Pt needing verbal/demo cues for proper technique due to cognitive deficits. Pt tolerated session well with minimal rest breaks. Primary PT to see pt at next session and likely  plan to D/C at that time. Mentioned this to pt and pt's spouse with both of them seeming to be in agreement.     OBJECTIVE IMPAIRMENTS: Abnormal gait, decreased activity tolerance, decreased balance, decreased cognition, decreased endurance, decreased knowledge of condition, decreased knowledge of use of DME, decreased mobility, difficulty walking, decreased ROM, decreased strength, decreased safety awareness, impaired tone, and impaired vision/preception.   ACTIVITY LIMITATIONS: carrying, bending, standing, squatting, transfers, and locomotion level  PARTICIPATION LIMITATIONS: laundry, driving, community activity, and yard work  PERSONAL FACTORS: Age, Past/current experiences, and 3+ comorbidities: see above  are also affecting patient's functional outcome.   REHAB POTENTIAL: Good  CLINICAL DECISION MAKING: Evolving/moderate complexity  EVALUATION COMPLEXITY: Moderate  PLAN:  PT FREQUENCY: 2x/week  PT DURATION: 8 weeks  PLANNED INTERVENTIONS: Therapeutic exercises, Therapeutic activity, Neuromuscular re-education, Balance training, Gait training, Patient/Family education, Self Care, Joint mobilization, Manual therapy, and Re-evaluation  PLAN FOR NEXT SESSION: update HEP as needed, high intensity gait training, how is walking program and updates to HEP?, hip strengthening, dynamic balance tasks, assess step count with app and modify goal as needed, work on foot clearance activities and modified SLS, Plan to D/C at 6/11  Drake Leach, PT, DPT 10/17/2022, 10:59 AM

## 2022-10-18 ENCOUNTER — Encounter: Payer: Self-pay | Admitting: Physician Assistant

## 2022-10-18 ENCOUNTER — Ambulatory Visit: Payer: Medicare Other | Admitting: Physician Assistant

## 2022-10-18 VITALS — BP 146/90 | HR 70 | Resp 18 | Ht 75.0 in | Wt 200.0 lb

## 2022-10-18 DIAGNOSIS — R413 Other amnesia: Secondary | ICD-10-CM | POA: Diagnosis not present

## 2022-10-18 MED ORDER — DONEPEZIL HCL 10 MG PO TABS: ORAL_TABLET | ORAL | 11 refills | Status: DC

## 2022-10-18 NOTE — Patient Instructions (Addendum)
It was a pleasure to see you today at our office.   Recommendations:   Start Donepezil 10 mg :Take half tablet (5 mg) daily for 2 weeks, then increase to the full tablet at 10 mg daily.  Check labs today Follow up Mon Dec 9 11:30      For assessment of decision of mental capacity and competency:  Call Dr. Erick Blinks, geriatric psychiatrist at [phone] Counseling regarding caregiver distress, including caregiver depression, anxiety and issues regarding community resources, adult day care programs, adult living facilities, or memory care questions:  please contact your  Primary Doctor's Social Worker  Whom to call: Memory  decline, memory medications: Call our office [phone]  For psychiatric meds, mood meds: Please have your primary care physician manage these medications.  If you have any severe symptoms of a stroke, or other severe issues such as confusion,severe chills or fever, etc call 911 or go to the ER as you may need to be evaluated further    RECOMMENDATIONS FOR ALL PATIENTS WITH MEMORY PROBLEMS: 1. Continue to exercise (Recommend 30 minutes of walking everyday, or 3 hours every week) 2. Increase social interactions - continue going to [place] and enjoy social gatherings with friends and family 3. Eat healthy, avoid fried foods and eat more fruits and vegetables 4. Maintain adequate blood pressure, blood sugar, and blood cholesterol level. Reducing the risk of stroke and cardiovascular disease also helps promoting better memory. 5. Avoid stressful situations. Live a simple life and avoid aggravations. Organize your time and prepare for the next day in anticipation. 6. Sleep well, avoid any interruptions of sleep and avoid any distractions in the bedroom that may interfere with adequate sleep quality 7. Avoid sugar, avoid sweets as there is a strong link between excessive sugar intake, diabetes, and cognitive impairment We discussed the Mediterranean diet, which has been  shown to help patients reduce the risk of progressive memory disorders and reduces cardiovascular risk. This includes eating fish, eat fruits and green leafy vegetables, nuts like almonds and hazelnuts, walnuts, and also use olive oil. Avoid fast foods and fried foods as much as possible. Avoid sweets and sugar as sugar use has been linked to worsening of memory function.  There is always a concern of gradual progression of memory problems. If this is the case, then we may need to adjust level of care according to patient needs. Support, both to the patient and caregiver, should then be put into place.      You have been referred for a neuropsychological evaluation (i.e., evaluation of memory and thinking abilities). Please bring someone with you to this appointment if possible, as it is helpful for the doctor to hear from both you and another adult who knows you well. Please bring eyeglasses and hearing aids if you wear them.    The evaluation will take approximately 3 hours and has two parts:   The first part is a clinical interview with the neuropsychologist (Dr. Milbert Coulter or Dr. Roseanne Reno). During the interview, the neuropsychologist will speak with you and the individual you brought to the appointment.    The second part of the evaluation is testing with the doctor's technician Annabelle Harman or Selena Batten). During the testing, the technician will ask you to remember different types of material, solve problems, and answer some questionnaires. Your family member will not be present for this portion of the evaluation.   Please note: We must reserve several hours of the neuropsychologist's time and the psychometrician's time for  your evaluation appointment. As such, there is a No-Show fee of $100. If you are unable to attend any of your appointments, please contact our office as soon as possible to reschedule.    FALL PRECAUTIONS: Be cautious when walking. Scan the area for obstacles that may increase the risk of trips  and falls. When getting up in the mornings, sit up at the edge of the bed for a few minutes before getting out of bed. Consider elevating the bed at the head end to avoid drop of blood pressure when getting up. Walk always in a well-lit room (use night lights in the walls). Avoid area rugs or power cords from appliances in the middle of the walkways. Use a walker or a cane if necessary and consider physical therapy for balance exercise. Get your eyesight checked regularly.  FINANCIAL OVERSIGHT: Supervision, especially oversight when making financial decisions or transactions is also recommended.  HOME SAFETY: Consider the safety of the kitchen when operating appliances like stoves, microwave oven, and blender. Consider having supervision and share cooking responsibilities until no longer able to participate in those. Accidents with firearms and other hazards in the house should be identified and addressed as well.   ABILITY TO BE LEFT ALONE: If patient is unable to contact 911 operator, consider using LifeLine, or when the need is there, arrange for someone to stay with patients. Smoking is a fire hazard, consider supervision or cessation. Risk of wandering should be assessed by caregiver and if detected at any point, supervision and safe proof recommendations should be instituted.  MEDICATION SUPERVISION: Inability to self-administer medication needs to be constantly addressed. Implement a mechanism to ensure safe administration of the medications.   DRIVING: Regarding driving, in patients with progressive memory problems, driving will be impaired. We advise to have someone else do the driving if trouble finding directions or if minor accidents are reported. Independent driving assessment is available to determine safety of driving.   If you are interested in the driving assessment, you can contact the following:  [facility] in [place] [phone]  Driver Rehabilitative  Services [phone]  [facility] [phone] or [phone]    Mediterranean Diet A Mediterranean diet refers to food and lifestyle choices that are based on the traditions of countries located on the [employer]. This way of eating has been shown to help prevent certain conditions and improve outcomes for people who have chronic diseases, like kidney disease and heart disease. What are tips for following this plan? Lifestyle  Cook and eat meals together with your family, when possible. Drink enough fluid to keep your urine clear or pale yellow. Be physically active every day. This includes: Aerobic exercise like running or swimming. Leisure activities like gardening, walking, or housework. Get 7-8 hours of sleep each night. If recommended by your health care provider, drink red wine in moderation. This means 1 glass a day for nonpregnant women and 2 glasses a day for men. A glass of wine equals 5 oz (150 mL). Reading food labels  Check the serving size of packaged foods. For foods such as rice and pasta, the serving size refers to the amount of cooked product, not dry. Check the total fat in packaged foods. Avoid foods that have saturated fat or trans fats. Check the ingredients list for added sugars, such as corn syrup. Shopping  At the grocery store, buy most of your food from the areas near the walls of the store. This  includes: Fresh fruits and vegetables (produce). Grains, beans, nuts, and seeds. Some of these may be available in unpackaged forms or large amounts (in bulk). Fresh seafood. Poultry and eggs. Low-fat dairy products. Buy whole ingredients instead of prepackaged foods. Buy fresh fruits and vegetables in-season from local farmers markets. Buy frozen fruits and vegetables in resealable bags. If you do not have access to quality fresh seafood, buy precooked frozen shrimp or canned fish, such as tuna, salmon, or  sardines. Buy small amounts of raw or cooked vegetables, salads, or olives from the deli or salad bar at your store. Stock your pantry so you always have certain foods on hand, such as olive oil, canned tuna, canned tomatoes, rice, pasta, and beans. Cooking  Cook foods with extra-virgin olive oil instead of using butter or other vegetable oils. Have meat as a side dish, and have vegetables or grains as your main dish. This means having meat in small portions or adding small amounts of meat to foods like pasta or stew. Use beans or vegetables instead of meat in common dishes like chili or lasagna. Experiment with different cooking methods. Try roasting or broiling vegetables instead of steaming or sauteing them. Add frozen vegetables to soups, stews, pasta, or rice. Add nuts or seeds for added healthy fat at each meal. You can add these to yogurt, salads, or vegetable dishes. Marinate fish or vegetables using olive oil, lemon juice, garlic, and fresh herbs. Meal planning  Plan to eat 1 vegetarian meal one day each week. Try to work up to 2 vegetarian meals, if possible. Eat seafood 2 or more times a week. Have healthy snacks readily available, such as: Vegetable sticks with hummus. Greek yogurt. Fruit and nut trail mix. Eat balanced meals throughout the week. This includes: Fruit: 2-3 servings a day Vegetables: 4-5 servings a day Low-fat dairy: 2 servings a day Fish, poultry, or lean meat: 1 serving a day Beans and legumes: 2 or more servings a week Nuts and seeds: 1-2 servings a day Whole grains: 6-8 servings a day Extra-virgin olive oil: 3-4 servings a day Limit red meat and sweets to only a few servings a month What are my food choices? Mediterranean diet Recommended Grains: Whole-grain pasta. Brown rice. Bulgar wheat. Polenta. Couscous. Whole-wheat bread. Orpah Cobb. Vegetables: Artichokes. Beets. Broccoli. Cabbage. Carrots. Eggplant. Green beans. Chard. Kale. Spinach.  Onions. Leeks. Peas. Squash. Tomatoes. Peppers. Radishes. Fruits: Apples. Apricots. Avocado. Berries. Bananas. Cherries. Dates. Figs. Grapes. Lemons. Melon. Oranges. Peaches. Plums. Pomegranate. Meats and other protein foods: Beans. Almonds. Sunflower seeds. Pine nuts. Peanuts. Cod. Salmon. Scallops. Shrimp. Tuna. Tilapia. Clams. Oysters. Eggs. Dairy: Low-fat milk. Cheese. Greek yogurt. Beverages: Water. Red wine. Herbal tea. Fats and oils: Extra virgin olive oil. Avocado oil. Grape seed oil. Sweets and desserts: [place] yogurt with honey. Baked apples. Poached pears. Trail mix. Seasoning and other foods: Basil. Cilantro. Coriander. Cumin. Mint. Parsley. Sage. Rosemary. Tarragon. Garlic. Oregano. Thyme. Pepper. Balsalmic vinegar. Tahini. Hummus. Tomato sauce. Olives. Mushrooms. Limit these Grains: Prepackaged pasta or rice dishes. Prepackaged cereal with added sugar. Vegetables: Deep fried potatoes (french fries). Fruits: Fruit canned in syrup. Meats and other protein foods: Beef. Pork. Lamb. Poultry with skin. Hot dogs. Tomasa Blase. Dairy: Ice cream. Sour cream. Whole milk. Beverages: Juice. Sugar-sweetened soft drinks. Beer. Liquor and spirits. Fats and oils: Butter. Canola oil. Vegetable oil. Beef fat (tallow). Lard. Sweets and desserts: Cookies. Cakes. Pies. Candy. Seasoning and other foods: Mayonnaise. Premade sauces and marinades. The items listed may not be a  complete list. Talk with your dietitian about what dietary choices are right for you. Summary The Mediterranean diet includes both food and lifestyle choices. Eat a variety of fresh fruits and vegetables, beans, nuts, seeds, and whole grains. Limit the amount of red meat and sweets that you eat. Talk with your health care provider about whether it is safe for you to drink red wine in moderation. This means 1 glass a day for nonpregnant women and 2 glasses a day for men. A glass of wine equals 5 oz (150 mL). This information is not  intended to replace advice given to you by your health care provider. Make sure you discuss any questions you have with your health care provider. Document Released: 12/23/2015 Document Revised: 01/25/2016 Document Reviewed: 12/23/2015 Elsevier Interactive Patient Education  2017 [employer].      [address]

## 2022-10-18 NOTE — Progress Notes (Addendum)
Assessment/Plan:   Antonio Adams is a very pleasant 77 y.o. year old RH male with a history of hypertension, hyperlipidemia, DM2 with diabetic polyneuropathy, OSA noncompliant with CPAP, history of PTSD, history of lumbar stenosis with joint pain, history of stage IV metastatic renal carcinoma with lymphadenopathy and questionable hepatic involvement noted in 2022 chemo due to poor tolerance to the medications but without any significant progression since May 2023, on surveillance and imaging, vitamin D deficiency, CKD stage V refusing HD, BPH, anemia of CKD, CHF, and recent left parieto-occipital periventricular infarct likely due to ASVD on Plavix, and a prior history of MCI seen today for evaluation of memory loss.  Patient was initially evaluated in 2017, but he has not demonstrated any interest in therapy at the time, however his fiance is concerned about it.  MoCA today is 12/30, thus showing cognitive decline since his first visit.  Most recent MRI of the brain personally reviewed remarkable for the acute infarct mentioned above, but without significant atrophy or volume loss.  By now, dementia is noted, likely due to multiple etiologies given his vascular history.  Recommendations are as follows:   Dementia of multiple etiologies, with vascular component  Check B12, TSH Start donepezil 10 mg daily, take half tab for 2 weeks, then increase to 1 tab daily if tolerated.  Side effects discussed. Continue to control mood as per PCP Continue PT, OT and ST Recommend good control of cardiovascular risk factors and secondary stroke prevention.  Continue Plavix Folllow up in 6 months  Subjective:   The patient is accompanied by his fiance who supplements the history.   How long did patient have memory difficulties?  Patient reports that he has been having memory issues dating back to at least 2017, but after his hospitalization in April of this year due to stroke, the memory gets worse  as reported by his fiance.  He reports more difficulty remembering recent conversations and names of people, dates and taking medicines.    repeats oneself?  No  Disoriented when walking into a room?  "Off and on". "He feels says. Leaving objects in unusual places? Endorsed, misplaces things and cannot remember where he put them.  Wandering behavior?  denies   Any personality changes?  More reserved than before, he used to be more outgoing. Any history of depression?:  Patient denies   Hallucinations or paranoia?  Patient denies.  However, sometimes he feels the presence of someone.  He then asks "Who is in the house, anyone in the house?"  Seizures?   Patient denies    Any sleep changes? Does not sleep well.  "He is up most of the night. He thinks he has to go somewhere in the middle of the night sometimes and wants to get up and get dressed.  He denies vivid dreams or REM behavior or sleepwalking.  Sleep apnea? Endorsed Has not used the CPAP in a long time Any hygiene concerns?  Patient denies   Independent of bathing and dressing?  Endorsed  Does the patient needs help with medications?  Fiance is in charge   Who is in charge of the finances?  PSA and him do it together  Any changes in appetite?  denies     Patient have trouble swallowing? denies   Does the patient cook?  No  Any headaches?   denies   Chronic back pain ? denies   Ambulates with difficulty? Goes to PT, which is helping with balance  Recent falls or head injuries? denies   Vision changes? denies   Unilateral weakness, numbness or tingling? denies   Any tremors?   denies   Any anosmia?  denies   Any incontinence of urine?  He has frequency Any bowel dysfunction? denies      Patient lives with wife  History of heavy alcohol intake? denies   History of heavy tobacco use? denies   Family history of dementia? denies  Does patient drive? No longer drives    Initial Visit 04/12/22 : Antonio Adams is a 77 y.o.  R-handed male with history of DM2 with diabetic polyneuropathy, OSA on nightly CPAP, PTSD, history of lumbar stenosis with chronic back pain, metastatic renal carcinoma with lymphadenopathy and questionable hepatic involvement noted in 2022, not on chemo due to poor tolerance to the medications, and without significant progression since May 2023, only on surveillance imaging, stage IV, eczema, vitamin D deficiency, CKD stage V refusing HD, BPH, anemia of CKD  Zinc deficiency, chronic heart failure, mild cognitive impairment, presenting for evaluation of left PCA territory acute CVA.  In review, the patient presented to the ED on 04/07/22 with several day history of lethargy with fall 2 days prior to presentation, as well as 1 day history of mild left frontal temporal dull headache initially felt to be due to anemia.  He denied any  unilateral weakness, numbness, or speech difficulty. Although he denied any vision changes, it was documented that the patient had hemianopia (he denies during this visit) .Patient  never had a similar episode. Denies any history of TIA. Denies vertigo or dizziness. Denies headaches during this visit, dysarthria or dysphagia.  He has a history of MCI, and patient denies any significant memory issues.  Denies any chest pain, or shortness of breath. Denies any fever or chills, or night sweats. No tobacco. No new meds or hormonal supplements. He is on Plavix for 3 months and ASA, then ASA alone. Denies any recent long distance trips or recent surgeries. No sick contacts. No new stressors present in personal life. Patient is compliant with his medications. Patient is not very active. No family history of stroke. Patient was not administered TPA  as is beyond time window for treatment consideration.        Stroke workup at the hospital on 04/03/2022, personally reviewed is remarkable for: MRI of the brain showing a left PCA large infarct and punctate right PCA infarct MRA remarkable for  left PCA P3 branch occlusion, ICA siphon atherosclerosis with possible tandem significant stenosis of the right siphon Carotid Dopplers unremarkable 2D echo EF 50 to 55% Lower extremity venous Doppler without DVT LDL 159     Stroke workup 05/6107  Stroke:  left parieto-occipital periventricular infarct.  Old left parieto-occipital periventricular infarct Etiology: Likely small vessel disease CT head age-indeterminate right thalamic lacunar infarct and chronic left PCA infarct, no acute abnormality MRI acute left parieto-occipital periventricular infarct in the region of prior infarct MRA no LVO or hemodynamically significant stenosis Carotid Doppler 1 to 39% stenosis in bilateral ICAs 2D Echo EF 50 to 55%, grade 1 diastolic dysfunction, mild to moderate dilation of left atrium LDL 164 HgbA1c 5.4 VTE prophylaxis - SQH   Past Medical History:  Diagnosis Date   ADENOCARCINOMA, PROSTATE, GLEASON GRADE 6 12/21/2009   prostate cancer   ALLERGIC RHINITIS 03/16/2007   Allergy    Anxiety    Arthritis    back    BACK PAIN WITH RADICULOPATHY 11/23/2008  Cancer of kidney (HCC)    partial right kidney removed   CARPAL TUNNEL SYNDROME, BILATERAL 03/16/2007   Chronic back pain    Constipation    takes Senokot daily   Depression    occasionally   DIABETES MELLITUS, TYPE II 03/16/2007   takes Januvia and MEtformin daily   GASTROINTESTINAL HEMORRHAGE, HX OF 03/16/2007   GERD 03/16/2007   takes Omeprazole daily   Glaucoma    mild - no eye drops   Hemorrhoids    History of colon polyps    HYPERLIPIDEMIA 03/16/2007   takes Crestor daily   HYPERTENSION 03/16/2007   takes Diltiazem and Lisinopril daily    Kidney cancer, primary, with metastasis from kidney to other site [facility])    LEG CRAMPS 05/22/2007   Nocturia    Overactive bladder    PARESTHESIA 05/21/2007   Proctitis    PTSD (post-traumatic stress disorder)    wakes up may be dreaming of fighting   Rectal bleeding    Dr.Norins has explained  its from the Radiation that he has received   Rectal bleeding    Renal cell carcinoma (HCC)    Sleep apnea    uses CPAP nightly   Stomach cancer (HCC)    Urinary frequency    takes Toviaz daily     Past Surgical History:  Procedure Laterality Date   ADRENALECTOMY Right 02/26/2020   Procedure: OPEN RIGHT RENAL CELL METASTATECTOMY AND  ADRENALECTOMY;  Surgeon: Crist Fat, MD;  Location: [facility];  Service: Urology;  Laterality: Right;   BACK SURGERY  1983, 2008   repeat surgery; [facility] '08   CARPAL TUNNEL RELEASE     bilateral   CERVICAL FUSION     COLONOSCOPY     colonosocpy     ESOPHAGOGASTRODUODENOSCOPY     ED   FLEXIBLE SIGMOIDOSCOPY N/A 01/08/2017   Procedure: FLEXIBLE SIGMOIDOSCOPY;  Surgeon: Hilarie Fredrickson, MD;  Location: [facility];  Service: Endoscopy;  Laterality: N/A;   HOT HEMOSTASIS N/A 01/08/2017   Procedure: HOT HEMOSTASIS (ARGON PLASMA COAGULATION/BICAP);  Surgeon: Hilarie Fredrickson, MD;  Location: [facility] ENDOSCOPY;  Service: Endoscopy;  Laterality: N/A;   IR RADIOLOGIST EVAL & MGMT  12/17/2019   IR RADIOLOGIST EVAL & MGMT  07/01/2020   IR RADIOLOGIST EVAL & MGMT  08/25/2020   LUMBAR LAMINECTOMY  10/30/2011   Procedure: MICRODISCECTOMY LUMBAR LAMINECTOMY;  Surgeon: Kerrin Champagne, MD;  Location: [facility] OR;  Service: Orthopedics;  Laterality: Right;  Right L4-5 and L5-S1 Microdiscectomy   LUMBAR LAMINECTOMY  06/17/2012   Procedure: MICRODISCECTOMY LUMBAR LAMINECTOMY;  Surgeon: Kerrin Champagne, MD;  Location: [facility] OR;  Service: Orthopedics;  Laterality: N/A;  Right L5-S1 microdiscectomy   OPEN PARTIAL HEPATECTOMY  N/A 02/26/2020   Procedure: OPEN PARTIAL HEPATECTOMY, CHOLECYSTECTOMY;  Surgeon: Almond Lint, MD;  Location: [facility];  Service: General;  Laterality: N/A;   POLYPECTOMY     PROSTATE SURGERY  march 2012   seed implant   RADIOFREQUENCY ABLATION N/A 07/21/2020   Procedure: CT MICROWAVE ABLATION;  Surgeon: Sterling Big, MD;  Location: [facility];  Service: Anesthesiology;   Laterality: N/A;   ROBOTIC ASSITED PARTIAL NEPHRECTOMY Right 09/08/2015   Procedure: XI ROBOTIC ASSITED RIGHT PARTIAL NEPHRECTOMY;  Surgeon: Crist Fat, MD;  Location: [facility];  Service: Urology;  Laterality: Right;  Clamp on: 1033 Clamp off: 1103 Total Clamp Time: 30 minutes   SHOULDER ARTHROSCOPY WITH SUBACROMIAL DECOMPRESSION Left 01/20/2016   Procedure: LEFT SHOULDER ARTHROSCOPY WITH EXTENSIVE  DEBRIDEMENT, ACROMIOPLASTY;  Surgeon: Loreta Ave, MD;  Location: [facility];  Service: Orthopedics;  Laterality: Left;   SHOULDER SURGERY  LFT   Stress Cardiolite  12/24/2003   negative for ischemia   UPPER ESOPHAGEAL ENDOSCOPIC ULTRASOUND (EUS)  04/18/2016   [facility]   UPPER GASTROINTESTINAL ENDOSCOPY     WOUND EXPLORATION     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     Allergies  Allergen Reactions   Ace Inhibitors Swelling    lisinopril   Gabapentin Other (See Comments)    hallucinations   Xyzal [Levocetirizine] Other (See Comments)    Break out   Opdivo [Nivolumab] Rash    Current Outpatient Medications  Medication Instructions   acetaminophen (TYLENOL) 1,000 mg, Oral, Every 6 hours PRN   atorvastatin (LIPITOR) 80 mg, Oral, Daily   calcitRIOL (ROCALTROL) 0.25 mcg, Oral, Daily   clopidogrel (PLAVIX) 75 mg, Oral, Daily   Cyanocobalamin (B-12 PO) 1,000 mcg, Oral, Daily   cyclobenzaprine (FLEXERIL) 5 mg, Oral, 2 times daily PRN   donepezil (ARICEPT) 10 MG tablet Take half tablet (5 mg) daily for 2 weeks, then one tablet daily.   DULoxetine (CYMBALTA) 20 mg, Oral, Daily   ferrous sulfate 325 mg, Oral, 2 times daily, Patient needs office visit for further refills   fluocinonide-emollient (LIDEX-E) 0.05 % cream 1 Application, Topical, 2 times daily   furosemide (LASIX) 40 MG tablet TAKE 1 TABLET BY MOUTH EVERY DAY   isosorbide mononitrate (IMDUR) 60 mg, Oral, Daily   metoprolol tartrate (LOPRESSOR) 25 mg, Oral, 2 times daily   mirtazapine  (REMERON SOL-TAB) 15 mg, Oral, Daily at bedtime   pantoprazole (PROTONIX) 20 mg, Oral, Daily   polyvinyl alcohol (LIQUIFILM TEARS) 1.4 % ophthalmic solution 1 drop, Both Eyes, Daily PRN   sodium bicarbonate 1,300 mg, Oral, 2 times daily   solifenacin (VESICARE) 5 mg, Oral, Daily   tamsulosin (FLOMAX) 0.4 MG CAPS capsule TAKE 1 CAPSULE (0.4 MG TOTAL) BY MOUTH DAILY.   traMADol (ULTRAM) 50 mg, Oral, Every 12 hours PRN   zinc gluconate 50 mg, Oral, Daily     VITALS:   Vitals:   10/18/22 1114 10/18/22 1215  BP: (!) 155/95 (!) 146/90  Pulse: 70   Resp: 18   SpO2: 98%   Weight: 200 lb (90.7 kg)   Height: 6\' 3"  (1.905 m)        PHYSICAL EXAM   HEENT:  Normocephalic, atraumatic. The mucous membranes are moist. The superficial temporal arteries are without ropiness or tenderness. Cardiovascular: Regular rate and rhythm. Lungs: Clear to auscultation bilaterally. Neck: There are no carotid bruits noted bilaterally.  NEUROLOGICAL:    10/22/2022    7:00 PM 12/26/2018   10:00 AM 05/29/2018   10:00 AM 10/15/2017   11:00 AM  Montreal Cognitive Assessment   Visuospatial/ Executive (0/5) 1  3 5   Naming (0/3) 3  3 3   Attention: Read list of digits (0/2) 2 1 2 2   Attention: Read list of letters (0/1) 1 1 1 1   Attention: Serial 7 subtraction starting at 100 (0/3) 0 1 2 3   Language: Repeat phrase (0/2) 1 0 1 2  Language : Fluency (0/1) 0 0 0 0  Abstraction (0/2) 1 0 1 2  Delayed Recall (0/5) 0 0 0 2  Orientation (0/6) 2 5 6 6   Total 11  19 26   Adjusted Score (based on education) 12  04/12/2022    9:00 AM 08/02/2017    3:19 PM 07/07/2015   12:08 PM  MMSE - Mini Mental State Exam  Orientation to time 3 5 5   Orientation to Place 5 5 5   Registration 3 3 3   Attention/ Calculation 3 4 1   Recall 1 2 2   Language- name 2 objects 2 2 2   Language- repeat 1 1 1   Language- follow 3 step command 3 3 3   Language- read & follow direction 1 1 1   Write a sentence 1 1 1   Copy design 1 1 1    Total score 24 28 25      Orientation:  Alert and oriented to person, place not to date or time.  Flat affect.  No aphasia or dysarthria. Fund of knowledge is reduced. Recent and remote memory impaired.  Attention and concentration are normal.  Able to name objects and repeat phrases. Delayed recall 0/5 Cranial nerves: There is good facial symmetry. Extraocular muscles are intact and visual fields are full to confrontational testing. Speech is fluent and clear. No tongue deviation. Hearing is intact to conversational tone. Tone: Tone is good throughout. Sensation: Sensation is intact to light touch and pinprick throughout. Vibration is intact at the bilateral big toe.  Coordination: The patient has no difficulty with RAM's or FNF bilaterally. Normal finger to nose  Motor: Strength is 5/5 in the bilateral upper and lower extremities. There is no pronator drift. There are no fasciculations noted. DTR's: Deep tendon reflexes are 2/4 at the bilateral biceps, triceps, brachioradialis, patella and achilles.  Plantar responses are downgoing bilaterally. Gait and Station: The patient is able to ambulate without difficulty.The patient is able to heel toe walk without any difficulty.The patient is able to ambulate in a tandem fashion. The patient is able to stand in the Romberg position.     Thank you for allowing [place] the opportunity to participate in the care of this nice patient. Please do not hesitate to contact us for any questions or concerns.   Total time spent on today's visit was 31 minutes dedicated to this patient today, preparing to see patient, examining the patient, ordering tests and/or medications and counseling the patient, documenting clinical information in the EHR or other health record, independently interpreting results and communicating results to the patient/family, discussing treatment and goals, answering patient's questions and coordinating care.  Cc:  Etta Grandchild, MD  Marlowe Kays 10/22/2022 7:39 PM

## 2022-10-19 ENCOUNTER — Other Ambulatory Visit: Payer: Self-pay

## 2022-10-19 ENCOUNTER — Inpatient Hospital Stay: Payer: Medicare Other | Attending: Internal Medicine

## 2022-10-19 ENCOUNTER — Telehealth: Payer: Self-pay | Admitting: Medical Oncology

## 2022-10-19 ENCOUNTER — Inpatient Hospital Stay: Payer: Medicare Other | Admitting: Internal Medicine

## 2022-10-19 VITALS — BP 153/86 | HR 71 | Temp 98.1°F | Resp 16 | Wt 200.7 lb

## 2022-10-19 DIAGNOSIS — N186 End stage renal disease: Secondary | ICD-10-CM | POA: Diagnosis not present

## 2022-10-19 DIAGNOSIS — Z85528 Personal history of other malignant neoplasm of kidney: Secondary | ICD-10-CM | POA: Diagnosis not present

## 2022-10-19 DIAGNOSIS — E1122 Type 2 diabetes mellitus with diabetic chronic kidney disease: Secondary | ICD-10-CM | POA: Insufficient documentation

## 2022-10-19 DIAGNOSIS — C772 Secondary and unspecified malignant neoplasm of intra-abdominal lymph nodes: Secondary | ICD-10-CM | POA: Diagnosis not present

## 2022-10-19 DIAGNOSIS — Z905 Acquired absence of kidney: Secondary | ICD-10-CM | POA: Insufficient documentation

## 2022-10-19 DIAGNOSIS — C787 Secondary malignant neoplasm of liver and intrahepatic bile duct: Secondary | ICD-10-CM | POA: Insufficient documentation

## 2022-10-19 DIAGNOSIS — Z79899 Other long term (current) drug therapy: Secondary | ICD-10-CM | POA: Insufficient documentation

## 2022-10-19 DIAGNOSIS — Z8546 Personal history of malignant neoplasm of prostate: Secondary | ICD-10-CM | POA: Insufficient documentation

## 2022-10-19 DIAGNOSIS — I1311 Hypertensive heart and chronic kidney disease without heart failure, with stage 5 chronic kidney disease, or end stage renal disease: Secondary | ICD-10-CM | POA: Diagnosis not present

## 2022-10-19 DIAGNOSIS — Z7902 Long term (current) use of antithrombotics/antiplatelets: Secondary | ICD-10-CM | POA: Insufficient documentation

## 2022-10-19 DIAGNOSIS — C641 Malignant neoplasm of right kidney, except renal pelvis: Secondary | ICD-10-CM

## 2022-10-19 DIAGNOSIS — Z9221 Personal history of antineoplastic chemotherapy: Secondary | ICD-10-CM | POA: Insufficient documentation

## 2022-10-19 DIAGNOSIS — Z923 Personal history of irradiation: Secondary | ICD-10-CM | POA: Diagnosis not present

## 2022-10-19 LAB — CBC WITH DIFFERENTIAL/PLATELET
Abs Immature Granulocytes: 0.01 10*3/uL (ref 0.00–0.07)
Basophils Absolute: 0 10*3/uL (ref 0.0–0.1)
Basophils Relative: 1 %
Eosinophils Absolute: 0.1 10*3/uL (ref 0.0–0.5)
Eosinophils Relative: 3 %
HCT: 31.3 % — ABNORMAL LOW (ref 39.0–52.0)
Hemoglobin: 10.2 g/dL — ABNORMAL LOW (ref 13.0–17.0)
Immature Granulocytes: 0 %
Lymphocytes Relative: 14 %
Lymphs Abs: 0.5 10*3/uL — ABNORMAL LOW (ref 0.7–4.0)
MCH: 30 pg (ref 26.0–34.0)
MCHC: 32.6 g/dL (ref 30.0–36.0)
MCV: 92.1 fL (ref 80.0–100.0)
Monocytes Absolute: 0.6 10*3/uL (ref 0.1–1.0)
Monocytes Relative: 18 %
Neutro Abs: 2.2 10*3/uL (ref 1.7–7.7)
Neutrophils Relative %: 64 %
Platelets: 179 10*3/uL (ref 150–400)
RBC: 3.4 MIL/uL — ABNORMAL LOW (ref 4.22–5.81)
RDW: 15.7 % — ABNORMAL HIGH (ref 11.5–15.5)
WBC: 3.5 10*3/uL — ABNORMAL LOW (ref 4.0–10.5)
nRBC: 1.2 % — ABNORMAL HIGH (ref 0.0–0.2)

## 2022-10-19 LAB — COMPREHENSIVE METABOLIC PANEL
ALT: 26 U/L (ref 0–44)
AST: 21 U/L (ref 15–41)
Albumin: 3.7 g/dL (ref 3.5–5.0)
Alkaline Phosphatase: 93 U/L (ref 38–126)
Anion gap: 10 (ref 5–15)
BUN: 63 mg/dL — ABNORMAL HIGH (ref 8–23)
CO2: 22 mmol/L (ref 22–32)
Calcium: 9.4 mg/dL (ref 8.9–10.3)
Chloride: 105 mmol/L (ref 98–111)
Creatinine, Ser: 10 mg/dL (ref 0.61–1.24)
GFR, Estimated: 5 mL/min — ABNORMAL LOW (ref >60)
Glucose, Bld: 94 mg/dL (ref 70–99)
Potassium: 4.6 mmol/L (ref 3.5–5.1)
Sodium: 137 mmol/L (ref 135–145)
Total Bilirubin: 0.3 mg/dL (ref 0.3–1.2)
Total Protein: 6.4 g/dL — ABNORMAL LOW (ref 6.5–8.1)

## 2022-10-19 NOTE — Telephone Encounter (Signed)
CRITICAL VALUE STICKER  CRITICAL VALUE:Creatinine =10.00  RECEIVER (on-site recipient of call):Antonio Adams  DATE & TIME NOTIFIED: 10/19/2022 @1134   MESSENGER (representative from lab):lab  MD NOTIFIED: MOhamed  TIME OF NOTIFICATION:1135  RESPONSE:  stage 5 chronic kidney disease

## 2022-10-19 NOTE — Progress Notes (Signed)
[facility] Telephone:([phone]   [phone]  OFFICE PROGRESS NOTE  Etta Grandchild, MD [address] 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  The patient was initially diagnosed with localized disease in 2017.  PRIOR THERAPY: 1) status post right partial nephrectomy with stage Ib 6.0 cm papillary tumor in 2017. 2) status post right adrenalectomy and metastatic resection of her recurrent kidney tumor and right peritoneum completed in October 2021 under the care of Dr. Marlou Porch and the final pathology confirmed the presence of papillary renal cell carcinoma. 3) the patient had evidence for recurrent disease in March 2022 with adrenal metastasis in addition to hepatic involvement and underwent percutaneous microwave ablation of the adrenal gland as well as the hepatic metastasis completed in March 2022 under the care of Dr. Archer Asa 4) status post treatment with nivolumab 480 Mg IV every 4 weeks started February 22, 2021 and discontinued November 2022 based on The patient's request.  CURRENT THERAPY: Observation  INTERVAL HISTORY: Antonio Adams 77 y.o. male came to the neck today to establish care with me after his primary oncologist Dr. Clelia Croft left the practice.  The patient was accompanied by his girlfriend [place].  He is feeling fine today with no concerning complaints except for fatigue.  He has no current chest pain but has shortness of breath with exertion with no cough or hemoptysis.  He has no nausea, vomiting, diarrhea or constipation.  He has no headache or visual changes.  He denied having any recent weight loss or night sweats.  His last imaging studies in December 2023 showed some evidence for disease progression but the patient had no intervention since that time.  He has no strong family history of cancer.  The patient is single and used to work in the [employer]  in the past.  He has a history of smoking but long time ago no history of alcohol or drug abuse.  MEDICAL HISTORY: Past Medical History:  Diagnosis Date   ADENOCARCINOMA, PROSTATE, GLEASON GRADE 6 12/21/2009   prostate cancer   ALLERGIC RHINITIS 03/16/2007   Allergy    Anxiety    Arthritis    back    BACK PAIN WITH RADICULOPATHY 11/23/2008   Cancer of kidney (HCC)    partial right kidney removed   CARPAL TUNNEL SYNDROME, BILATERAL 03/16/2007   Chronic back pain    Constipation    takes Senokot daily   Depression    occasionally   DIABETES MELLITUS, TYPE II 03/16/2007   takes Januvia and MEtformin daily   GASTROINTESTINAL HEMORRHAGE, HX OF 03/16/2007   GERD 03/16/2007   takes Omeprazole daily   Glaucoma    mild - no eye drops   Hemorrhoids    History of colon polyps    HYPERLIPIDEMIA 03/16/2007   takes Crestor daily   HYPERTENSION 03/16/2007   takes Diltiazem and Lisinopril daily    Kidney cancer, primary, with metastasis from kidney to other site [facility])    LEG CRAMPS 05/22/2007   Nocturia    Overactive bladder    PARESTHESIA 05/21/2007   Proctitis    PTSD (post-traumatic stress disorder)    wakes up may be dreaming of fighting   Rectal bleeding    Dr.Norins has explained its from the Radiation that he has received   Rectal bleeding    Renal cell carcinoma (HCC)    Sleep apnea  uses CPAP nightly   Stomach cancer (HCC)    Urinary frequency    takes Toviaz daily    ALLERGIES:  is allergic to ace inhibitors, gabapentin, xyzal [levocetirizine], and opdivo [nivolumab].  MEDICATIONS:  Current Outpatient Medications  Medication Sig Dispense Refill   acetaminophen (TYLENOL) 500 MG tablet Take 1,000 mg by mouth every 6 (six) hours as needed for mild pain.     atorvastatin (LIPITOR) 80 MG tablet Take 1 tablet (80 mg total) by mouth daily. 90 tablet 1   calcitRIOL (ROCALTROL) 0.25 MCG capsule Take 0.25 mcg by mouth daily.     clopidogrel (PLAVIX) 75 MG tablet Take 1 tablet (75 mg  total) by mouth daily. 90 tablet 3   Cyanocobalamin (B-12 PO) Take 1,000 mcg by mouth daily.     cyclobenzaprine (FLEXERIL) 5 MG tablet Take 1 tablet (5 mg total) by mouth 2 (two) times daily as needed for muscle spasms. 6 tablet 0   donepezil (ARICEPT) 10 MG tablet Take half tablet (5 mg) daily for 2 weeks, then one tablet daily. 30 tablet 11   DULoxetine (CYMBALTA) 20 MG capsule Take 1 capsule (20 mg total) by mouth daily. 30 capsule 5   ferrous sulfate 325 (65 FE) MG tablet Take 1 tablet (325 mg total) by mouth 2 (two) times daily. Patient needs office visit for further refills (Patient taking differently: Take 325 mg by mouth daily with breakfast.) 60 tablet 1   fluocinonide-emollient (LIDEX-E) 0.05 % cream Apply 1 Application topically 2 (two) times daily. 60 g 2   furosemide (LASIX) 40 MG tablet TAKE 1 TABLET BY MOUTH EVERY DAY 90 tablet 1   isosorbide mononitrate (IMDUR) 60 MG 24 hr tablet Take 1 tablet (60 mg total) by mouth daily. 30 tablet 2   metoprolol tartrate (LOPRESSOR) 25 MG tablet TAKE 1 TABLET BY MOUTH TWICE A DAY 180 tablet 3   mirtazapine (REMERON SOL-TAB) 15 MG disintegrating tablet Take 15 mg by mouth at bedtime.     pantoprazole (PROTONIX) 20 MG tablet Take 1 tablet (20 mg total) by mouth daily. (Patient taking differently: Take 20 mg by mouth daily as needed for heartburn or indigestion.) 30 tablet 0   polyvinyl alcohol (LIQUIFILM TEARS) 1.4 % ophthalmic solution Place 1 drop into both eyes daily as needed for dry eyes.     sodium bicarbonate 650 MG tablet Take 2 tablets (1,300 mg total) by mouth 2 (two) times daily. 60 tablet 1   solifenacin (VESICARE) 5 MG tablet TAKE 1 TABLET (5 MG TOTAL) BY MOUTH DAILY. 90 tablet 1   tamsulosin (FLOMAX) 0.4 MG CAPS capsule TAKE 1 CAPSULE (0.4 MG TOTAL) BY MOUTH DAILY. (Patient taking differently: Take 0.4 mg by mouth daily.) 30 capsule 1   traMADol (ULTRAM) 50 MG tablet Take 1 tablet (50 mg total) by mouth every 12 (twelve) hours as  needed. 30 tablet 2   zinc gluconate 50 MG tablet Take 1 tablet (50 mg total) by mouth daily. 90 tablet 1   No current facility-administered medications for this visit.    SURGICAL HISTORY:  Past Surgical History:  Procedure Laterality Date   ADRENALECTOMY Right 02/26/2020   Procedure: OPEN RIGHT RENAL CELL METASTATECTOMY AND  ADRENALECTOMY;  Surgeon: Crist Fat, MD;  Location: [facility];  Service: Urology;  Laterality: Right;   BACK SURGERY  1983, 2008   repeat surgery; ESI '08   CARPAL TUNNEL RELEASE     bilateral   CERVICAL FUSION     COLONOSCOPY  colonosocpy     ESOPHAGOGASTRODUODENOSCOPY     ED   FLEXIBLE SIGMOIDOSCOPY N/A 01/08/2017   Procedure: FLEXIBLE SIGMOIDOSCOPY;  Surgeon: Hilarie Fredrickson, MD;  [facility];  Service: Endoscopy;  Laterality: N/A;   HOT HEMOSTASIS N/A 01/08/2017   Procedure: HOT HEMOSTASIS (ARGON PLASMA COAGULATION/BICAP);  Surgeon: Hilarie Fredrickson, MD;  Location: [facility] END[facility]COPY;  Service: Endoscopy;  Laterality: N/A;   IR RADIOLOGIST EVAL & MGMT  12/17/2019   IR RADIOLOGIST EVAL & MGMT  07/01/2020   IR RADIOLOGIST EVAL & MGMT  08/25/2020   LUMBAR LAMINECTOMY  10/30/2011   Procedure: MICRODISCECTOMY LUMBAR LAMINECTOMY;  Surgeon: Kerrin Champagne, MD;  Location: [facility] OR;  Service: Orthopedics;  Laterality: Right;  Right L4-5 and L5-S1 Microdiscectomy   LUMBAR LAMINECTOMY  06/17/2012   Procedure: MICRODISCECTOMY LUMBAR LAMINECTOMY;  Surgeon: Kerrin Champagne, MD;  Location: [facility] OR;  Service: Orthopedics;  Laterality: N/A;  Right L5-S1 microdiscectomy   OPEN PARTIAL HEPATECTOMY  N/A 02/26/2020   Procedure: OPEN PARTIAL HEPATECTOMY, CHOLECYSTECTOMY;  Surgeon: Almond Lint, MD;  Location: [facility];  Service: General;  Laterality: N/A;   POLYPECTOMY     PROSTATE SURGERY  march 2012   seed implant   RADIOFREQUENCY ABLATION N/A 07/21/2020   Procedure: CT MICROWAVE ABLATION;  Surgeon: Sterling Big, MD;  Location: [facility];  Service: Anesthesiology;  Laterality:  N/A;   ROBOTIC ASSITED PARTIAL NEPHRECTOMY Right 09/08/2015   Procedure: XI ROBOTIC ASSITED RIGHT PARTIAL NEPHRECTOMY;  Surgeon: Crist Fat, MD;  Location: [facility];  Service: Urology;  Laterality: Right;  Clamp on: 1033 Clamp off: 1103 Total Clamp Time: 30 minutes   SHOULDER ARTHROSCOPY WITH SUBACROMIAL DECOMPRESSION Left 01/20/2016   Procedure: LEFT SHOULDER ARTHROSCOPY WITH EXTENSIVE  DEBRIDEMENT, ACROMIOPLASTY;  Surgeon: Loreta Ave, MD;  Location: [facility];  Service: Orthopedics;  Laterality: Left;   SHOULDER SURGERY  LFT   Stress Cardiolite  12/24/2003   negative for ischemia   UPPER ESOPHAGEAL ENDOSCOPIC ULTRASOUND (EUS)  04/18/2016   [facility]   UPPER GASTROINTESTINAL ENDOSCOPY     WOUND EXPLORATION     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    REVIEW OF SYSTEMS:  Constitutional: positive for fatigue Eyes: negative Ears, nose, mouth, throat, and face: negative Respiratory: negative Cardiovascular: negative Gastrointestinal: negative Genitourinary:negative Integument/breast: negative Hematologic/lymphatic: negative Musculoskeletal:negative Neurological: negative Behavioral/Psych: negative Endocrine: negative Allergic/Immunologic: negative   PHYSICAL EXAMINATION: General appearance: alert, cooperative, fatigued, and no distress Head: Normocephalic, without obvious abnormality, atraumatic Neck: no adenopathy, no JVD, supple, symmetrical, trachea midline, and thyroid not enlarged, symmetric, no tenderness/mass/nodules Lymph nodes: Cervical, supraclavicular, and axillary nodes normal. Resp: clear to auscultation bilaterally Back: symmetric, no curvature. ROM normal. No CVA tenderness. Cardio: regular rate and rhythm, S1, S2 normal, no murmur, click, rub or gallop GI: soft, non-tender; bowel sounds normal; no masses,  no organomegaly Extremities: extremities normal, atraumatic, no cyanosis or edema Neurologic: Alert and oriented X  3, normal strength and tone. Normal symmetric reflexes. Normal coordination and gait  ECOG PERFORMANCE STATUS: 1 - Symptomatic but completely ambulatory  Blood pressure (!) 153/86, pulse 71, temperature 98.1 F (36.7 C), temperature source Oral, resp. rate 16, weight 200 lb 11.2 oz (91 kg), SpO2 97 %.  LABORATORY DATA: Lab Results  Component Value Date   WBC 3.5 (L) 10/19/2022   HGB 10.2 (L) 10/19/2022   HCT 31.3 (L) 10/19/2022   MCV 92.1 10/19/2022   PLT 179 10/19/2022  Chemistry      Component Value Date/Time   NA 139 08/25/2022 0928   NA 140 04/05/2022 0000   K 5.1 08/25/2022 0928   CL 110 08/25/2022 0928   CO2 18 (L) 08/25/2022 0928   BUN 67 (H) 08/25/2022 0928   BUN 42 (A) 04/05/2022 0000   CREATININE 9.18 (H) 08/25/2022 0928   CREATININE 7.28 (HH) 04/14/2022 1501   CREATININE 2.11 (H) 12/18/2019 1425   GLU 97 04/05/2022 0000      Component Value Date/Time   CALCIUM 9.0 08/25/2022 0928   ALKPHOS 94 08/24/2022 0852   AST 19 08/24/2022 0852   AST 19 04/14/2022 1501   ALT 19 08/24/2022 0852   ALT 13 04/14/2022 1501   BILITOT 0.4 08/24/2022 0852   BILITOT 0.3 04/14/2022 1501       RADIOGRAPHIC STUDIES: XR KNEE 3 VIEW LEFT  Result Date: 09/19/2022 X-rays demonstrate mild to moderate degenerative changes the medial compartment.  Moderate degenerative changes the patellofemoral compartment.  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  The patient was initially diagnosed with localized disease in 2017. He is status post the following treatment: 1) status post right partial nephrectomy with stage Ib 6.0 cm papillary tumor in 2017. 2) status post right adrenalectomy and metastatic resection of her recurrent kidney tumor and  right peritoneum completed in October 2021 under the care of Dr. Marlou Porch and the final pathology confirmed the presence of papillary renal cell carcinoma. 3) the patient had evidence for recurrent disease in March 2022 with adrenal metastasis in addition to hepatic involvement and underwent percutaneous microwave ablation of the adrenal gland as well as the hepatic metastasis completed in March 2022 under the care of Dr. Archer Asa 4) status post treatment with nivolumab 480 Mg IV every 4 weeks started February 22, 2021 and discontinued November 2022 based on the patient's request in addition to significant cramps in his hands. He has been in observation since that time and his imaging studies in December 2023 showed some evidence for disease progression but the patient has not had any treatment since that time. He is here today to establish care with me. I recommended for him to have repeat CT scan of the chest, abdomen and pelvis for restaging of his disease and if he has evidence for disease progression, may consider him for treatment or discuss palliative care and hospice. The patient and his fiance are in agreement with the current plan. He will come back for follow-up visit in around 2-3 weeks after repeating the imaging studies. He was advised to call immediately if he has any other concerning symptoms in the interval. The patient voices understanding of current disease status and treatment options and is in agreement with the current care plan.  All questions were answered. The patient knows to call the clinic with any problems, questions or concerns. We can certainly see the patient much sooner if necessary.  The total time spent in the appointment was 40 minutes.  Disclaimer: This note was dictated with voice recognition software. Similar sounding words can inadvertently be transcribed and may not be corrected upon review.

## 2022-10-24 ENCOUNTER — Ambulatory Visit: Payer: Medicare Other | Admitting: Physical Therapy

## 2022-10-24 ENCOUNTER — Encounter: Payer: Self-pay | Admitting: Speech Pathology

## 2022-10-24 ENCOUNTER — Encounter: Payer: Self-pay | Admitting: Physical Therapy

## 2022-10-24 ENCOUNTER — Ambulatory Visit: Payer: Medicare Other | Admitting: Speech Pathology

## 2022-10-24 VITALS — BP 160/90

## 2022-10-24 DIAGNOSIS — M6281 Muscle weakness (generalized): Secondary | ICD-10-CM | POA: Diagnosis not present

## 2022-10-24 DIAGNOSIS — R2689 Other abnormalities of gait and mobility: Secondary | ICD-10-CM | POA: Diagnosis not present

## 2022-10-24 DIAGNOSIS — R2681 Unsteadiness on feet: Secondary | ICD-10-CM

## 2022-10-24 DIAGNOSIS — R471 Dysarthria and anarthria: Secondary | ICD-10-CM

## 2022-10-24 DIAGNOSIS — R41841 Cognitive communication deficit: Secondary | ICD-10-CM

## 2022-10-24 NOTE — Patient Instructions (Signed)
    Use the calendar - keep it simple and clean - cue Antonio Adams to cross off each day, review the week each day to try to get in the habit of using it.  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  Listen to and discuss Ted Talks or Podcasts Read and discuss short articles of interest to you- Take notes on these if memory is a challenge Discuss social media posts Look and discuss photo albums  The best activities to improve cognition are functional, real life activities that are important to you:  Plan a menu Participate in household chores and decisions (with supervision) Participate in managing finances Plan a party, trip or tailgate with all of the details (even if you aren't really going to carry it out) Participate in your hobby as you are able with assistance Manage your texts, emails with supervision if needed. Google search for items (even if you're not really going to buy anything) and compare prices and features Socialize -  however, too many visitors can be overwhelming, so set limits "My doctor said I should only visit (or talk) for 20 minutes" or "I do better when I visit with just 1-2 people at a time for 20 minutes"    It's good to use real in-person games, not just apps  Apps:  [employer]  Loud Ah - 10 seconds 10x  Read 10 sentences with strong volume

## 2022-10-24 NOTE — Therapy (Signed)
OUTPATIENT SPEECH LANGUAGE PATHOLOGY EVALUATION   Patient Name: Antonio Adams MRN: [id number] DOB:April 12, 1946, 77 y.o., male Today's Date: 10/24/2022  PCP: Etta Grandchild, MD REFERRING PROVIDER: Etta Grandchild, MD  END OF SESSION:  End of Session - 10/24/22 1151     Visit Number 1    Number of Visits 25    Date for SLP Re-Evaluation 01/16/23    Authorization Type [employer] Medicare    SLP Start Time 1100    SLP Stop Time  1145    SLP Time Calculation (min) 45 min    Activity Tolerance Patient tolerated treatment well             Past Medical History:  Diagnosis Date   ADENOCARCINOMA, PROSTATE, GLEASON GRADE 6 12/21/2009   prostate cancer   ALLERGIC RHINITIS 03/16/2007   Allergy    Anxiety    Arthritis    back    BACK PAIN WITH RADICULOPATHY 11/23/2008   Cancer of kidney (HCC)    partial right kidney removed   CARPAL TUNNEL SYNDROME, BILATERAL 03/16/2007   Chronic back pain    Constipation    takes Senokot daily   Depression    occasionally   DIABETES MELLITUS, TYPE II 03/16/2007   takes Januvia and MEtformin daily   GASTROINTESTINAL HEMORRHAGE, HX OF 03/16/2007   GERD 03/16/2007   takes Omeprazole daily   Glaucoma    mild - no eye drops   Hemorrhoids    History of colon polyps    HYPERLIPIDEMIA 03/16/2007   takes Crestor daily   HYPERTENSION 03/16/2007   takes Diltiazem and Lisinopril daily    Kidney cancer, primary, with metastasis from kidney to other site [facility])    LEG CRAMPS 05/22/2007   Nocturia    Overactive bladder    PARESTHESIA 05/21/2007   Proctitis    PTSD (post-traumatic stress disorder)    wakes up may be dreaming of fighting   Rectal bleeding    Dr.Norins has explained its from the Radiation that he has received   Rectal bleeding    Renal cell carcinoma (HCC)    Sleep apnea    uses CPAP nightly   Stomach cancer (HCC)    Urinary frequency    takes Toviaz daily   Past Surgical History:  Procedure Laterality Date   ADRENALECTOMY Right  02/26/2020   Procedure: OPEN RIGHT RENAL CELL METASTATECTOMY AND  ADRENALECTOMY;  Surgeon: Crist Fat, MD;  Location: [facility];  Service: Urology;  Laterality: Right;   BACK SURGERY  1983, 2008   repeat surgery; ESI '08   CARPAL TUNNEL RELEASE     bilateral   CERVICAL FUSION     COLONOSCOPY     colonosocpy     ESOPHAGOGASTRODUODENOSCOPY     ED   FLEXIBLE SIGMOIDOSCOPY N/A 01/08/2017   Procedure: FLEXIBLE SIGMOIDOSCOPY;  Surgeon: Hilarie Fredrickson, MD;  Location: [facility] ENDOSCOPY;  Service: Endoscopy;  Laterality: N/A;   HOT HEMOSTASIS N/A 01/08/2017   Procedure: HOT HEMOSTASIS (ARGON PLASMA COAGULATION/BICAP);  Surgeon: Hilarie Fredrickson, MD;  Location: [facility] ENDOSCOPY;  Service: Endoscopy;  Laterality: N/A;   IR RADIOLOGIST EVAL & MGMT  12/17/2019   IR RADIOLOGIST EVAL & MGMT  07/01/2020   IR RADIOLOGIST EVAL & MGMT  08/25/2020   LUMBAR LAMINECTOMY  10/30/2011   Procedure: MICRODISCECTOMY LUMBAR LAMINECTOMY;  Surgeon: Kerrin Champagne, MD;  Location: [facility] OR;  Service: Orthopedics;  Laterality: Right;  Right L4-5 and L5-S1 Microdiscectomy   LUMBAR LAMINECTOMY  06/17/2012  Procedure: MICRODISCECTOMY LUMBAR LAMINECTOMY;  Surgeon: Kerrin Champagne, MD;  Location[facility] OR;  Service: Orthopedics;  Laterality: N/A;  Right L5-S1 microdiscectomy   OPEN PARTIAL HEPATECTOMY  N/A 02/26/2020   Procedure: OPEN PARTIAL HEPATECTOMY, CHOLECYSTECTOMY;  Surgeon: Almond Lint, MD;  Location: [facility];  Service: General;  Laterality: N/A;   POLYPECTOMY     PROSTATE SURGERY  march 2012   seed implant   RADIOFREQUENCY ABLATION N/A 07/21/2020   Procedure: CT MICROWAVE ABLATION;  Surgeon: Sterling Big, MD;  Location: [facility];  Service: Anesthesiology;  Laterality: N/A;   ROBOTIC ASSITED PARTIAL NEPHRECTOMY Right 09/08/2015   Procedure: XI ROBOTIC ASSITED RIGHT PARTIAL NEPHRECTOMY;  Surgeon: Crist Fat, MD;  Location: [facility];  Service: Urology;  Laterality: Right;  Clamp on: 1033 Clamp off: 1103 Total Clamp Time: 30 minutes    SHOULDER ARTHROSCOPY WITH SUBACROMIAL DECOMPRESSION Left 01/20/2016   Procedure: LEFT SHOULDER ARTHROSCOPY WITH EXTENSIVE  DEBRIDEMENT, ACROMIOPLASTY;  Surgeon: Loreta Ave, MD;  Location: [facility];  Service: Orthopedics;  Laterality: Left;   SHOULDER SURGERY  LFT   Stress Cardiolite  12/24/2003   negative for ischemia   UPPER ESOPHAGEAL ENDOSCOPIC ULTRASOUND (EUS)  04/18/2016   [facility]   UPPER GASTROINTESTINAL ENDOSCOPY     WOUND EXPLORATION     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   Patient Active Problem List   Diagnosis Date Noted   Traumatic coccydynia 08/30/2022   Acute ischemic stroke (HCC) 08/24/2022   Encounter for general adult medical examination with abnormal findings 01/23/2022   Anemia due to zinc deficiency 12/20/2021   Symptomatic anemia 12/15/2021   Cancer related pain 12/15/2021   Chronic heart failure with preserved ejection fraction (HFpEF) (HCC) 08/12/2021   Tinea cruris 08/12/2021   Anemia due to chronic kidney disease 07/20/2021   CKD (chronic kidney disease) stage 5, GFR less than 15 ml/min (HCC) 05/24/2021   BPH (benign prostatic hyperplasia) 05/24/2021   Senile nuclear sclerosis 05/20/2021   Vitamin D deficiency 05/20/2021   Vitreous degeneration, unspecified eye 05/20/2021   Intrinsic eczema 04/18/2021   Atherosclerosis of aorta (HCC) 12/30/2020   Mixed conductive and sensorineural hearing loss of left ear with restricted hearing of right ear 09/01/2020   Metastatic renal cell carcinoma (HCC) 02/26/2020   Diabetic polyneuropathy associated with type 2 diabetes mellitus (HCC) 12/18/2019   Murmur, cardiac 10/08/2018   DDD (degenerative disc disease), cervical 10/08/2018   OAB (overactive bladder) 04/04/2018   Seasonal allergic rhinitis due to pollen 11/01/2016   Post traumatic stress disorder (PTSD) 01/29/2014   Spinal stenosis of lumbar region with radiculopathy 10/30/2011    Class: Chronic   Obstructive  sleep apnea 11/24/2010   Cognitive impairment 11/24/2010   ADENOCARCINOMA, PROSTATE, GLEASON GRADE 6 12/21/2009   Essential hypertension 03/16/2007   GERD 03/16/2007    ONSET DATE: 08/24/22   REFERRING DIAG:  THERAPY DIAG:  I63.9 (ICD-10-CM) - Acute ischemic stroke (HCC)  Rationale for Evaluation and Treatment: Rehabilitation  SUBJECTIVE:   SUBJECTIVE STATEMENT: "He can't do his pills anymore and I have to be at his house all of the time" Pt accompanied by:  fiance  PERTINENT HISTORY: Pt is a 77 y.o. male who presents with episode of confusion/hallucinations. MRI (08/24/22) revealed "Acute left parieto-occipital periventricular infarcts in the region of prior infarct". SLE after previous CVA (04/09/22) noted cognitive impairment (memory/problem solving) with pt receiving OP SLP services post discharge. PMH: GERD, prior L PCA stroke, metastatic RCC,  prostate cancer, stage V CKD declining dialysis.   PAIN:  Are you having pain? No  FALLS: Has patient fallen in last 6 months?  See PT evaluation for details  LIVING ENVIRONMENT: Lives with: lives alone - fiance staying with him during the day until bedtime Lives in: House/apartment  PLOF:  Level of assistance: Independent with ADLs, Needed assistance with IADLS Employment: Retired  PATIENT GOALS: To manage AM meds, calendar, to get back to doing things" fiance  OBJECTIVE:   DIAGNOSTIC FINDINGS: Acute left parieto-occipital periventricular infarcts in the region of prior infarct.  COGNITION: Overall cognitive status: Impaired and Decline since CVA and course of ST in Feb 2024 Areas of impairment:  Attention: Impaired: Sustained, Selective, Alternating Memory: Impaired: Immediate Working Short term Engineering geologist Awareness: Impaired: Intellectual Executive function: Impaired: Initiation, Problem solving, Organization, Planning, Self-correction, and Slow processing Behavior: WNL Functional deficits: meds,  bills, household chores, humor  COGNITIVE COMMUNICATION: Following directions: Follows one step commands consistently  Auditory comprehension: Impaired: due to slow processing Verbal expression: Impaired: word finding difficulty noted in naming restaurants he frequents Functional communication: WFL  ORAL MOTOR EXAMINATION: Overall status: WFL Comments:   STANDARDIZED ASSESSMENTS: N/A - moderate impairments on CLQT prior eval   MOTOR SPEECH:  Volume: Averaged 54dB in conversation Low vocal intensity Voice and Resonance WNL Motor Planning WNL Intelligibility reduced at sentence level   TODAY'S TREATMENT:                                                                                                                                         DATE: 10/24/22 (eval day): Initiated training in compensations for memory, attention, awareness including reinstating calendar and to do list - need for cues initially for 2-3 weeks to get Jaykob in the habit of using the calendar and list. Cognitive activities to do at home, use of schedule for am meds. Initiated training in HEP for dysarthria - Loud Ah averaged 80dB with frequent mod verbal cues, modeling. Oral reading conversational phrases averaged 68dB with usual mod modeling, verbal and gesture cues.   PATIENT EDUCATION: Education details: See Today's Treatment, Patient Instructions, compensations for cognition; HEP for dysarthria Person educated: Patient and Spouse Education method: Explanation, Demonstration, Verbal cues, and Handouts Education comprehension: verbal cues required and needs further education   GOALS: Goals reviewed with patient? Yes  SHORT TERM GOALS: Target date: 11/21/22  Pt will average 85dB on loud Ah with rare min A Baseline: Goal status: INITIAL  2.  Pt will average 72dB on 18/20 10 word sentences with rare min A Baseline:  Goal status: INITIAL  3.  Pt will use calendar and to do list to complete 2 household  chores a day 5/7 days and recall appointments, social events with occasional min A from fiance Baseline:  Goal status: INITIAL  4.  Pt will plan ahead 4 healthy meals for restaurants for 1  week using internet menus with occasional min A to target executive function and decision making Baseline:  Goal status: INITIAL  5.  Pt will manage AM meds with external aids and occasional min A from fiance over 1 week Baseline:  Goal status: INITIAL  6.  Pt will keep a schedule of getting out of bed upon waking for 2 hours before napping Baseline:  Goal status: INITIAL  LONG TERM GOALS: Target date: 01/16/23  Pt will manage am meds with none missed with rare min A from family over 1 week Baseline:  Goal status: INITIAL  2.  Pt will use calendar and to do list to be ready for appointments and complete daily household tasks with rare min A from family  Baseline:  Goal status: INITIAL  3.  Pt will average 70dB in structure speech tasks for 15 minutes with rare min A Baseline:  Goal status: INITIAL  4.  Pt will average 70dB over 18 minute conversation with rare min A Baseline:  Goal status: INITIAL  5.  Pt will require 2 or less requests for repetition when eating out in noisy environment Baseline:  Goal status: INITIAL  6.  Pt will plan and attend 2 social events (men's breakfast, family event) using external aids to recall his ride and times Baseline:  Goal status: INITIAL  ASSESSMENT:  CLINICAL IMPRESSION: Patient is a 77 y.o. male who was seen today for cognition and dysarthria. He is known to [place] from prior course of ST where he made good progress and was managing his meds, paying bills, managing his appointments and schedules. Since prior course, he has had a second CVA. His fiance [name] reports significant decline. Today he presents with moderate cognitive communication impairment and moderate hypokinetic dysarthria. Since 2nd CVA Carolan Clines is having to manage all medications and bills.  She is having to stay at his house am through bed time due to cognitive impairments. She endorses his volume is soft and she frequently needs him to repeat. She notes that wait staff often ask him to repeat as well. At this time, Pear observes poor initiation. Prior to 2nd CVA Salomon was fastidious with cleaning and organizing his home. He was doing his own laundry which she is now doing. He is not completing any chores. Bowdy wakes up, asks Pear his schedule and if he has nothing, he stays in bed and goes back to sleep until noon or 1:00. Carolan Clines is concerned that he is not taking his am meds until 1:00 so she has to go over, wake him and give him his meds. Kiaan verbalizes no awareness of his change In speech or his lack of initiation and poor sleep schedule. Leonette Most and [place] both endorse that he does not cook, but they eat out every day. This is concerning due to recurrent CVA.  After trials of exuberant voice therapy, he did note" I was in the [employer] and I would have to talk louder than this" I recommend skilled ST to maximize intelligibility and cognition for safety, independence and to reduce caregiver burden.   OBJECTIVE IMPAIRMENTS: include attention, memory, awareness, executive functioning, expressive language, and dysarthria. These impairments are limiting patient from managing medications, managing appointments, managing finances, household responsibilities, ADLs/IADLs, and effectively communicating at home and in community. Factors affecting potential to achieve goals and functional outcome are previous level of function.. Patient will benefit from skilled SLP services to address above impairments and improve overall function.  REHAB POTENTIAL: Good  PLAN:  SLP FREQUENCY:  2x/week  SLP DURATION: 12 weeks  PLANNED INTERVENTIONS: Language facilitation, Cueing hierachy, Cognitive reorganization, Internal/external aids, Oral motor exercises, Functional tasks, Multimodal communication  approach, and SLP instruction and feedback    Chet Greenley, Radene Journey, CCC-SLP 10/24/2022, 12:47 PM

## 2022-10-24 NOTE — Therapy (Signed)
OUTPATIENT PHYSICAL THERAPY NEURO TREATMENT/DISCHARGE   Patient Name: Antonio Adams MRN: [id number] DOB:[birth year], 77 y.o., male Today's Date: 10/24/2022   PCP: Sanda Linger, MD REFERRING PROVIDER: Almon Hercules, MD  10th Visit Physical Therapy Progress Note  Dates of Reporting Period: 08/29/22 to 10/17/22    END OF SESSION:  PT End of Session - 10/24/22 1020     Visit Number 11    Number of Visits 17    Date for PT Re-Evaluation 11/07/22    Authorization Type [employer]    Progress Note Due on Visit 10    PT Start Time 1020    PT Stop Time 1100    PT Time Calculation (min) 40 min    Equipment Utilized During Treatment Gait belt    Activity Tolerance Patient tolerated treatment well    Behavior During Therapy WFL for tasks assessed/performed              Past Medical History:  Diagnosis Date   ADENOCARCINOMA, PROSTATE, GLEASON GRADE 6 12/21/2009   prostate cancer   ALLERGIC RHINITIS 03/16/2007   Allergy    Anxiety    Arthritis    back    BACK PAIN WITH RADICULOPATHY 11/23/2008   Cancer of kidney (HCC)    partial right kidney removed   CARPAL TUNNEL SYNDROME, BILATERAL 03/16/2007   Chronic back pain    Constipation    takes Senokot daily   Depression    occasionally   DIABETES MELLITUS, TYPE II 03/16/2007   takes Januvia and MEtformin daily   GASTROINTESTINAL HEMORRHAGE, HX OF 03/16/2007   GERD 03/16/2007   takes Omeprazole daily   Glaucoma    mild - no eye drops   Hemorrhoids    History of colon polyps    HYPERLIPIDEMIA 03/16/2007   takes Crestor daily   HYPERTENSION 03/16/2007   takes Diltiazem and Lisinopril daily    Kidney cancer, primary, with metastasis from kidney to other site [facility])    LEG CRAMPS 05/22/2007   Nocturia    Overactive bladder    PARESTHESIA 05/21/2007   Proctitis    PTSD (post-traumatic stress disorder)    wakes up may be dreaming of fighting   Rectal bleeding    Dr.Norins has explained its from the Radiation that he has  received   Rectal bleeding    Renal cell carcinoma (HCC)    Sleep apnea    uses CPAP nightly   Stomach cancer (HCC)    Urinary frequency    takes Toviaz daily   Past Surgical History:  Procedure Laterality Date   ADRENALECTOMY Right 02/26/2020   Procedure: OPEN RIGHT RENAL CELL METASTATECTOMY AND  ADRENALECTOMY;  Surgeon: Crist Fat, MD;  Location: [facility];  Service: Urology;  Laterality: Right;   BACK SURGERY  1983, 2008   repeat surgery; [facility]SI '08   CARPAL TUNNEL RELEASE     bilateral   CERVICAL FUSION     COLONOSCOPY     colonosocpy     ESOPHAGOGASTRODUODENOSCOPY     ED   FLEXIBLE SIGMOIDOSCOPY N/A 01/08/2017   Procedure: FLEXIBLE SIGMOIDOSCOPY;  Surgeon: Hilarie Fredrickson, MD;  Location: [facility];  Service: Endoscopy;  Laterality: N/A;   HOT HEMOSTASIS N/A 01/08/2017   Procedure: HOT HEMOSTASIS (ARGON PLASMA COAGULATION/BICAP);  Surgeon: Hilarie Fredrickson, MD;  Location: [facility] ENDOSCOPY;  Service: Endoscopy;  Laterality: N/A;   IR RADIOLOGIST EVAL & MGMT  12/17/2019   IR RADIOLOGIST EVAL & MGMT  07/01/2020   IR  RADIOLOGIST EVAL & MGMT  08/25/2020   LUMBAR LAMINECTOMY  10/30/2011   Procedure: MICRODISCECTOMY LUMBAR LAMINECTOMY;  Surgeon: Kerrin Champagne, MD;  Location: [facility] OR;  Service: Orthopedics;  Laterality: Right;  Right L4-5 and L5-S1 Microdiscectomy   LUMBAR LAMINECTOMY  06/17/2012   Procedure: MICRODISCECTOMY LUMBAR LAMINECTOMY;  Surgeon: Kerrin Champagne, MD;  Location: [facility] OR;  Service: Orthopedics;  Laterality: N/A;  Right L5-S1 microdiscectomy   OPEN PARTIAL HEPATECTOMY  N/A 02/26/2020   Procedure: OPEN PARTIAL HEPATECTOMY, CHOLECYSTECTOMY;  Surgeon: Almond Lint, MD;  Location: [facility];  Service: General;  Laterality: N/A;   POLYPECTOMY     PROSTATE SURGERY  march 2012   seed implant   RADIOFREQUENCY ABLATION N/A 07/21/2020   Procedure: CT MICROWAVE ABLATION;  Surgeon: Sterling Big, MD;  Location: [facility];  Service: Anesthesiology;  Laterality: N/A;   ROBOTIC ASSITED  PARTIAL NEPHRECTOMY Right 09/08/2015   Procedure: XI ROBOTIC ASSITED RIGHT PARTIAL NEPHRECTOMY;  Surgeon: Crist Fat, MD;  Location: [facility];  Service: Urology;  Laterality: Right;  Clamp on: 1033 Clamp off: 1103 Total Clamp Time: 30 minutes   SHOULDER ARTHROSCOPY WITH SUBACROMIAL DECOMPRESSION Left 01/20/2016   Procedure: LEFT SHOULDER ARTHROSCOPY WITH EXTENSIVE  DEBRIDEMENT, ACROMIOPLASTY;  Surgeon: Loreta Ave, MD;  Location: [facility];  Service: Orthopedics;  Laterality: Left;   SHOULDER SURGERY  LFT   Stress Cardiolite  12/24/2003   negative for ischemia   UPPER ESOPHAGEAL ENDOSCOPIC ULTRASOUND (EUS)  04/18/2016   [facility]   UPPER GASTROINTESTINAL ENDOSCOPY     WOUND EXPLORATION     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   Patient Active Problem List   Diagnosis Date Noted   Traumatic coccydynia 08/30/2022   Acute ischemic stroke (HCC) 08/24/2022   Encounter for general adult medical examination with abnormal findings 01/23/2022   Anemia due to zinc deficiency 12/20/2021   Symptomatic anemia 12/15/2021   Cancer related pain 12/15/2021   Chronic heart failure with preserved ejection fraction (HFpEF) (HCC) 08/12/2021   Tinea cruris 08/12/2021   Anemia due to chronic kidney disease 07/20/2021   CKD (chronic kidney disease) stage 5, GFR less than 15 ml/min (HCC) 05/24/2021   BPH (benign prostatic hyperplasia) 05/24/2021   Senile nuclear sclerosis 05/20/2021   Vitamin D deficiency 05/20/2021   Vitreous degeneration, unspecified eye 05/20/2021   Intrinsic eczema 04/18/2021   Atherosclerosis of aorta (HCC) 12/30/2020   Mixed conductive and sensorineural hearing loss of left ear with restricted hearing of right ear 09/01/2020   Metastatic renal cell carcinoma (HCC) 02/26/2020   Diabetic polyneuropathy associated with type 2 diabetes mellitus (HCC) 12/18/2019   Murmur, cardiac 10/08/2018   DDD (degenerative disc disease), cervical  10/08/2018   OAB (overactive bladder) 04/04/2018   Seasonal allergic rhinitis due to pollen 11/01/2016   Post traumatic stress disorder (PTSD) 01/29/2014   Spinal stenosis of lumbar region with radiculopathy 10/30/2011    Class: Chronic   Obstructive sleep apnea 11/24/2010   Cognitive impairment 11/24/2010   ADENOCARCINOMA, PROSTATE, GLEASON GRADE 6 12/21/2009   Essential hypertension 03/16/2007   GERD 03/16/2007    ONSET DATE: 08/25/2022 (referral)   REFERRING DIAG:  I63.9 (ICD-10-CM) - Cerebrovascular accident (CVA), unspecified mechanism    THERAPY DIAG:  Muscle weakness (generalized)  Other abnormalities of gait and mobility  Unsteadiness on feet  Rationale for Evaluation and Treatment: Rehabilitation  SUBJECTIVE:  SUBJECTIVE STATEMENT:  No falls, no changes since he was last here. Reports back has been bothering him some, exercises are going slow at home. Pt's fiance reports he still has a hard time with getting up from surfaces.   Pt accompanied by: self and significant other - fiance [name]  PERTINENT HISTORY:  Patient was discharged from this setting on 3/7 but returned to the ED on 4/11 due to acute onset of dizziness. Patient found to have an acute left parietal-occipital perventricular infarcts in region of prior infarct.   PMH: ESRD not on HD, renal cancer metastatic to liver, adrenals and lymph nodes no longer on chemo, GERD, L PCA infarct November 2023, DM2, and anemia.  PAIN:  Are you having pain? No  PRECAUTIONS: Fall - R hemibody  WEIGHT BEARING RESTRICTIONS: No  FALLS: Has patient fallen in last 6 months? Yes. Number of falls 1 at time of initial stroke  PATIENT GOALS: "To step over curb without losing my balance."   OBJECTIVE:   DIAGNOSTIC FINDINGS:   MR BRAIN  WO IMPRESSION on 08/24/2022: Acute left parieto-occipital periventricular infarcts in the region of prior infarct.  COGNITION: Overall cognitive status: Impaired   TODAY'S TREATMENT:                                                                                                                               Vitals:   10/24/22 1030  BP: (!) 160/90    Therapeutic Activity: Goal assessment for discharge.   [facility] PT Assessment - 10/24/22 0001       Standardized Balance Assessment   10 Meter Walk 0.67 m/s   with SPC (min verbal cues and SBA)     Berg Balance Test   Sit to Stand Able to stand without using hands and stabilize independently    Standing Unsupported Able to stand safely 2 minutes    Sitting with Back Unsupported but Feet Supported on Floor or Stool Able to sit safely and securely 2 minutes    Stand to Sit Sits safely with minimal use of hands    Transfers Able to transfer safely, minor use of hands    Standing Unsupported with Eyes Closed Able to stand 10 seconds safely    Standing Unsupported with Feet Together Able to place feet together independently and stand 1 minute safely    From Standing, Reach Forward with Outstretched Arm Can reach forward >12 cm safely (5")    From Standing Position, Pick up Object from Floor Able to pick up shoe safely and easily    From Standing Position, Turn to Look Behind Over each Shoulder Looks behind from both sides and weight shifts well    Turn 360 Degrees Able to turn 360 degrees safely in 4 seconds or less    Standing Unsupported, Alternately Place Feet on Step/Stool Able to complete >2 steps/needs minimal assist    Standing Unsupported, One Foot in Front Able to plae foot  ahead of the other independently and hold 30 seconds    Standing on One Leg Unable to try or needs assist to prevent fall    Total Score 47             CURB:  Level of Assistance: SBA - min/mod verbal and visual cues for appropriate sequencing progressing  to none needed but recommend supervision given cognition Assistive device utilized: Single point cane Curb Comments: increased difficulty recalling appropriate sequencing, repeatedly leaves cane behind   [facility] PT Assessment - 10/24/22 0001       Standardized Balance Assessment   10 Meter Walk 0.67 m/s   with SPC (min verbal cues and SBA)     Berg Balance Test   Sit to Stand Able to stand without using hands and stabilize independently    Standing Unsupported Able to stand safely 2 minutes    Sitting with Back Unsupported but Feet Supported on Floor or Stool Able to sit safely and securely 2 minutes    Stand to Sit Sits safely with minimal use of hands    Transfers Able to transfer safely, minor use of hands    Standing Unsupported with Eyes Closed Able to stand 10 seconds safely    Standing Unsupported with Feet Together Able to place feet together independently and stand 1 minute safely    From Standing, Reach Forward with Outstretched Arm Can reach forward >12 cm safely (5")    From Standing Position, Pick up Object from Floor Able to pick up shoe safely and easily    From Standing Position, Turn to Look Behind Over each Shoulder Looks behind from both sides and weight shifts well    Turn 360 Degrees Able to turn 360 degrees safely in 4 seconds or less    Standing Unsupported, Alternately Place Feet on Step/Stool Able to complete >2 steps/needs minimal assist    Standing Unsupported, One Foot in Front Able to plae foot ahead of the other independently and hold 30 seconds    Standing on One Leg Unable to try or needs assist to prevent fall    Total Score 47            PATIENT EDUCATION: Education details: Continue HEP, return if notices decline in function Person educated: Patient and Caregiver fiance Education method: Explanation Education comprehension: verbalized understanding and needs further education  HOME EXERCISE PROGRAM:  HEP provided at D/C at last PT visit;  recommend to continue at this time:   Access Code: [email or web address] URL: [email or web address] Date: 09/12/2022 Prepared by: Camille Bal  Exercises - Corner Balance Feet Together With Eyes Closed  - 1 x daily - 5 x weekly - 1 sets - 2-3 reps - 30-45 seconds hold - Sit to Stand with Arms Crossed  - 1 x daily - 5 x weekly - 1-2 sets - 8 reps - Seated Hamstring Stretch  - 1 x daily - 5 x weekly - 1 sets - 2-3 reps - 45 seconds hold - Standing Tandem Balance with Counter Support  - 1 x daily - 5 x weekly - 1 sets - 2 reps - 45 seconds hold - Standing Single Leg Stance with Counter Support  - 1 x daily - 5 x weekly - 1 sets - 2 reps - 45 seconds hold   You Can Walk For A Certain Length Of Time Each Day  Walk 2-3 minutes or 250 steps 4-5 times per day.             Increase 1-2 minutes or 50 steps  every 7 days              Work up to 15 minutes or 700 steps (1-2 times per day).               Example:                         Day 1-2           4-5 minutes     3 times per day                         Day 7-8           10-12 minutes 2-3 times per day                         Day 13-14       20-22 minutes 1-2 times per day  GOALS: Goals reviewed with patient? Yes  SHORT TERM GOALS: Target date: 09/26/2022  Patient will demonstrate 100% compliance with initial HEP to continue to progress between physical therapy sessions.   Baseline: Continue with HEP provided at last PT D/C (still appropriate); progressing well and progressing towards step goal target, reports confidence Goal status: MET  2.  Patient will improve 5x Sit to Stand score to 32" or less with minimal amount of UE use necessary to demonstrate a minimal clinically important difference towards a decreased  risk for falls and improved lower extremity strength.   Baseline: 37.34" with mix of thigh assist and UE support, one forward LOB; improved to 21.91" with UE use Goal status: MET  3.  Patient  will improve TUG score to 15" or less with LRAD and no signs of imbalance to indicate a decreased risk of falls and demonstrate improved overall mobility.   Baseline: 15.23" without SPC with increased signs of instability, 19.58" with SPC (SBA); 13.5" with SPC  Goal status: MET  4.  Patient will improve gait speed to 0.7 m/s or greater with LRAD to indicate progress towards a decreased risk for falls.   Baseline: 0.60 m/s or 1.97 ft/sec with SPC (SBA); 0.81 m/s with SPC (2.66 ft/sec)  Goal status: MET  5.  Berg balance test to be assessed/goal written as indicated Baseline: Assessed Goal status: Discontinued STG due to level of baseline score  6.  Patient will ascend/descend curb with SBA and LRAD in order to more easily access community while shopping with fiance.  Baseline: CGA with SPC, SBA-CGA with SPC; SBA  Goal status: MET  LONG TERM GOALS: Target date: 10/24/2022  Patient will report demonstrate independence with final HEP in order to maintain current gains and continue to progress after physical therapy discharge.   Baseline: Report confidence in final HEP Goal status: MET  2.   Patient will improve 5x Sit to Stand score to 27" or less with minimal amount of UE use necessary to demonstrate progress towards a decreased  risk for falls and improved lower extremity strength.   Baseline: 37.34" with mix of thigh assist and UE support, one forward LOB ; one forward LOB; improved to 21.91" with UE use  17.5 seconds with no UE support on 10/17/22 Goal status: MET  3.  Patient will improve  TUG score to 13.5" or less with LRAD and no signs of imbalance to indicate a decreased risk of falls and demonstrate improved overall mobility.   Baseline: 15.23" without SPC with increased signs of instability, 19.58; 13.5" with SPC   13.2 seconds with no AD on 10/17/22 Goal status: MET  4.  Patient will improve gait speed to 0.85 m/s to indicate improvement to the level of community ambulator in  order to participate more easily in activities outside of the home.   Baseline: 0.60 m/s or 1.97 ft/sec with SPC (SBA); 0.81 m/s with SPC (2.66 ft/sec); .57 m/s on 10/17/22, 0.67 m/s on 10/24/2022 Goal status: NOT MET  5.  Patient will improve Berg Balance score by 5 points to indicate clinically significant progress towards a decreased risk of falls and improved static stability.   Baseline: 41/56; improved to 47/56 Goal status: MET  6.  Patient will ascend/descend curb with modI and LRAD in order to more easily access community while shopping with fiance.  Baseline: CGA with SPC; CGA with SPC; SBA with min verbal cues limited by cognition for safe sequencing Goal status: NOT MET  7.  Patient will improve FOTO score to 69 to achieve predicted improvements in functional mobility due to skilled physical therapy interventions to increase safety with and participation in daily activities. Baseline: 56; discontinued due to progressive health deterioration related to other conditions Goal status: DISCONTINUED  ASSESSMENT:  CLINICAL IMPRESSION: Patient is discharging from PT today with updated HEP. Patient continues to be at increased risk for falls as indicated by gait speed and cognitive deficits. Therapist also educated patient and caregiver on safety with curbs and recommends SBA at this time for safety as patient has difficult time recalling appropriate sequencing to perform tasks safely. Patient does demonstrate confidence in final HEP and improved balance as indicated by Sharlene Motts. No further PT services indicated at this time.   OBJECTIVE IMPAIRMENTS: Abnormal gait, decreased activity tolerance, decreased balance, decreased cognition, decreased endurance, decreased knowledge of condition, decreased knowledge of use of DME, decreased mobility, difficulty walking, decreased ROM, decreased strength, decreased safety awareness, impaired tone, and impaired vision/preception.   ACTIVITY LIMITATIONS:  carrying, bending, standing, squatting, transfers, and locomotion level  PARTICIPATION LIMITATIONS: laundry, driving, community activity, and yard work  PERSONAL FACTORS: Age, Past/current experiences, and 3+ comorbidities: see above  are also affecting patient's functional outcome.   REHAB POTENTIAL: Good  CLINICAL DECISION MAKING: Evolving/moderate complexity  EVALUATION COMPLEXITY: Moderate  PLAN:  PT FREQUENCY: 2x/week  PT DURATION: 8 weeks  PLANNED INTERVENTIONS: Therapeutic exercises, Therapeutic activity, Neuromuscular re-education, Balance training, Gait training, Patient/Family education, Self Care, Joint mobilization, Manual therapy, and Re-evaluation  PLAN FOR NEXT SESSION: NA - patient D/C with updated HEP  Carmelia Bake, PT, DPT 10/24/2022, 12:21 PM

## 2022-10-25 ENCOUNTER — Ambulatory Visit (HOSPITAL_COMMUNITY)
Admission: RE | Admit: 2022-10-25 | Discharge: 2022-10-25 | Disposition: A | Discharge status: Home / Self Care | Payer: Medicare Other | Source: Ambulatory Visit | Attending: Internal Medicine | Admitting: Internal Medicine

## 2022-10-25 DIAGNOSIS — C641 Malignant neoplasm of right kidney, except renal pelvis: Secondary | ICD-10-CM | POA: Insufficient documentation

## 2022-10-25 DIAGNOSIS — N281 Cyst of kidney, acquired: Secondary | ICD-10-CM | POA: Diagnosis not present

## 2022-10-25 DIAGNOSIS — J90 Pleural effusion, not elsewhere classified: Secondary | ICD-10-CM | POA: Diagnosis not present

## 2022-10-25 DIAGNOSIS — J9811 Atelectasis: Secondary | ICD-10-CM | POA: Diagnosis not present

## 2022-10-26 ENCOUNTER — Ambulatory Visit: Payer: Medicare Other | Admitting: Physical Therapy

## 2022-10-27 ENCOUNTER — Ambulatory Visit: Payer: Medicare Other | Admitting: Speech Pathology

## 2022-10-27 DIAGNOSIS — R2689 Other abnormalities of gait and mobility: Secondary | ICD-10-CM | POA: Diagnosis not present

## 2022-10-27 DIAGNOSIS — R471 Dysarthria and anarthria: Secondary | ICD-10-CM | POA: Diagnosis not present

## 2022-10-27 DIAGNOSIS — M6281 Muscle weakness (generalized): Secondary | ICD-10-CM | POA: Diagnosis not present

## 2022-10-27 DIAGNOSIS — R2681 Unsteadiness on feet: Secondary | ICD-10-CM | POA: Diagnosis not present

## 2022-10-27 NOTE — Therapy (Signed)
OUTPATIENT SPEECH LANGUAGE PATHOLOGY TREATMENT   Patient Name: Antonio Adams MRN: [id number] DOB:1945/11/12, 77 y.o., male Today's Date: 10/27/2022  PCP: Etta Grandchild, MD REFERRING PROVIDER: Etta Grandchild, MD  END OF SESSION:  End of Session - 10/27/22 1017     Visit Number 2    Number of Visits 25    Date for SLP Re-Evaluation 01/16/23    Authorization Type [employer] Medicare    SLP Start Time 1115    SLP Stop Time  1200    SLP Time Calculation (min) 45 min    Activity Tolerance Patient tolerated treatment well              Past Medical History:  Diagnosis Date   ADENOCARCINOMA, PROSTATE, GLEASON GRADE 6 12/21/2009   prostate cancer   ALLERGIC RHINITIS 03/16/2007   Allergy    Anxiety    Arthritis    back    BACK PAIN WITH RADICULOPATHY 11/23/2008   Cancer of kidney (HCC)    partial right kidney removed   CARPAL TUNNEL SYNDROME, BILATERAL 03/16/2007   Chronic back pain    Constipation    takes Senokot daily   Depression    occasionally   DIABETES MELLITUS, TYPE II 03/16/2007   takes Januvia and MEtformin daily   GASTROINTESTINAL HEMORRHAGE, HX OF 03/16/2007   GERD 03/16/2007   takes Omeprazole daily   Glaucoma    mild - no eye drops   Hemorrhoids    History of colon polyps    HYPERLIPIDEMIA 03/16/2007   takes Crestor daily   HYPERTENSION 03/16/2007   takes Diltiazem and Lisinopril daily    Kidney cancer, primary, with metastasis from kidney to other site [facility])    LEG CRAMPS 05/22/2007   Nocturia    Overactive bladder    PARESTHESIA 05/21/2007   Proctitis    PTSD (post-traumatic stress disorder)    wakes up may be dreaming of fighting   Rectal bleeding    Dr.Norins has explained its from the Radiation that he has received   Rectal bleeding    Renal cell carcinoma (HCC)    Sleep apnea    uses CPAP nightly   Stomach cancer (HCC)    Urinary frequency    takes Toviaz daily   Past Surgical History:  Procedure Laterality Date   ADRENALECTOMY Right  02/26/2020   Procedure: OPEN RIGHT RENAL CELL METASTATECTOMY AND  ADRENALECTOMY;  Surgeon: Crist Fat, MD;  Location: [facility];  Service: Urology;  Laterality: Right;   BACK SURGERY  1983, 2008   repeat surgery; ESI '08   CARPAL TUNNEL RELEASE     bilateral   CERVICAL FUSION     COLONOSCOPY     colonosocpy     ESOPHAGOGASTRODUODENOSCOPY     ED   FLEXIBLE SIGMOIDOSCOPY N/A 01/08/2017   Procedure: FLEXIBLE SIGMOIDOSCOPY;  Surgeon: Hilarie Fredrickson, MD;  Location: [facility] ENDOSCOPY;  Service: Endoscopy;  Laterality: N/A;   HOT HEMOSTASIS N/A 01/08/2017   Procedure: HOT HEMOSTASIS (ARGON PLASMA COAGULATION/BICAP);  Surgeon: Hilarie Fredrickson, MD;  Location: [facility] ENDOSCOPY;  Service: Endoscopy;  Laterality: N/A;   IR RADIOLOGIST EVAL & MGMT  12/17/2019   IR RADIOLOGIST EVAL & MGMT  07/01/2020   IR RADIOLOGIST EVAL & MGMT  08/25/2020   LUMBAR LAMINECTOMY  10/30/2011   Procedure: MICRODISCECTOMY LUMBAR LAMINECTOMY;  Surgeon: Kerrin Champagne, MD;  Location: [facility] OR;  Service: Orthopedics;  Laterality: Right;  Right L4-5 and L5-S1 Microdiscectomy   LUMBAR LAMINECTOMY  06/17/2012   Procedure: MICRODISCECTOMY LUMBAR LAMINECTOMY;  Surgeon: Kerrin Champagne, MD;  Location: [facility] OR;  Service: Orthopedics;  Laterality: N/A;  Right L5-S1 microdiscectomy   OPEN PARTIAL HEPATECTOMY  N/A 02/26/2020   Procedure: OPEN PARTIAL HEPATECTOMY, CHOLECYSTECTOMY;  Surgeon: Almond Lint, MD;  Location: [facility];  Service: General;  Laterality: N/A;   POLYPECTOMY     PROSTATE SURGERY  march 2012   seed implant   RADIOFREQUENCY ABLATION N/A 07/21/2020   Procedure: CT MICROWAVE ABLATION;  Surgeon: Sterling Big, MD;  Location: [facility];  Service: Anesthesiology;  Laterality: N/A;   ROBOTIC ASSITED PARTIAL NEPHRECTOMY Right 09/08/2015   Procedure: XI ROBOTIC ASSITED RIGHT PARTIAL NEPHRECTOMY;  Surgeon: Crist Fat, MD;  Location: [facility];  Service: Urology;  Laterality: Right;  Clamp on: 1033 Clamp off: 1103 Total Clamp Time: 30 minutes    SHOULDER ARTHROSCOPY WITH SUBACROMIAL DECOMPRESSION Left 01/20/2016   Procedure: LEFT SHOULDER ARTHROSCOPY WITH EXTENSIVE  DEBRIDEMENT, ACROMIOPLASTY;  Surgeon: Loreta Ave, MD;  Location: [facility];  Service: Orthopedics;  Laterality: Left;   SHOULDER SURGERY  LFT   Stress Cardiolite  12/24/2003   negative for ischemia   UPPER ESOPHAGEAL ENDOSCOPIC ULTRASOUND (EUS)  04/18/2016   [facility]   UPPER GASTROINTESTINAL ENDOSCOPY     WOUND EXPLORATION     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   Patient Active Problem List   Diagnosis Date Noted   Traumatic coccydynia 08/30/2022   Acute ischemic stroke (HCC) 08/24/2022   Encounter for general adult medical examination with abnormal findings 01/23/2022   Anemia due to zinc deficiency 12/20/2021   Symptomatic anemia 12/15/2021   Cancer related pain 12/15/2021   Chronic heart failure with preserved ejection fraction (HFpEF) (HCC) 08/12/2021   Tinea cruris 08/12/2021   Anemia due to chronic kidney disease 07/20/2021   CKD (chronic kidney disease) stage 5, GFR less than 15 ml/min (HCC) 05/24/2021   BPH (benign prostatic hyperplasia) 05/24/2021   Senile nuclear sclerosis 05/20/2021   Vitamin D deficiency 05/20/2021   Vitreous degeneration, unspecified eye 05/20/2021   Intrinsic eczema 04/18/2021   Atherosclerosis of aorta (HCC) 12/30/2020   Mixed conductive and sensorineural hearing loss of left ear with restricted hearing of right ear 09/01/2020   Metastatic renal cell carcinoma (HCC) 02/26/2020   Diabetic polyneuropathy associated with type 2 diabetes mellitus (HCC) 12/18/2019   Murmur, cardiac 10/08/2018   DDD (degenerative disc disease), cervical 10/08/2018   OAB (overactive bladder) 04/04/2018   Seasonal allergic rhinitis due to pollen 11/01/2016   Post traumatic stress disorder (PTSD) 01/29/2014   Spinal stenosis of lumbar region with radiculopathy 10/30/2011    Class: Chronic   Obstructive  sleep apnea 11/24/2010   Cognitive impairment 11/24/2010   ADENOCARCINOMA, PROSTATE, GLEASON GRADE 6 12/21/2009   Essential hypertension 03/16/2007   GERD 03/16/2007    ONSET DATE: 08/24/22   REFERRING DIAG:  THERAPY DIAG:  I63.9 (ICD-10-CM) - Acute ischemic stroke (HCC)  Rationale for Evaluation and Treatment: Rehabilitation  SUBJECTIVE:   SUBJECTIVE STATEMENT: "Okay"  Pt accompanied by:  fiance  PERTINENT HISTORY: Pt is a 77 y.o. male who presents with episode of confusion/hallucinations. MRI (08/24/22) revealed "Acute left parieto-occipital periventricular infarcts in the region of prior infarct". SLE after previous CVA (04/09/22) noted cognitive impairment (memory/problem solving) with pt receiving OP SLP services post discharge. PMH: GERD, prior L PCA stroke, metastatic RCC, prostate cancer, stage V CKD declining dialysis.   PAIN:  Are you  having pain? No  FALLS: Has patient fallen in last 6 months?  See PT evaluation for details  LIVING ENVIRONMENT: Lives with: lives alone - fiance staying with him during the day until bedtime Lives in: House/apartment  PLOF:  Level of assistance: Independent with ADLs, Needed assistance with IADLS Employment: Retired  PATIENT GOALS: To manage AM meds, calendar, to get back to doing things" fiance  OBJECTIVE:   DIAGNOSTIC FINDINGS: Acute left parieto-occipital periventricular infarcts in the region of prior infarct.  COGNITION: Overall cognitive status: Impaired and Decline since CVA and course of ST in Feb 2024 Areas of impairment:  Attention: Impaired: Sustained, Selective, Alternating Memory: Impaired: Immediate Working Short term Engineering geologist Awareness: Impaired: Intellectual Executive function: Impaired: Initiation, Problem solving, Organization, Planning, Self-correction, and Slow processing Behavior: WNL Functional deficits: meds, bills, household chores, humor  COGNITIVE COMMUNICATION: Following  directions: Follows one step commands consistently  Auditory comprehension: Impaired: due to slow processing Verbal expression: Impaired: word finding difficulty noted in naming restaurants he frequents Functional communication: WFL  ORAL MOTOR EXAMINATION: Overall status: WFL Comments:   STANDARDIZED ASSESSMENTS: N/A - moderate impairments on CLQT prior eval   MOTOR SPEECH:  Volume: Averaged 54dB in conversation Low vocal intensity Voice and Resonance WNL Motor Planning WNL Intelligibility reduced at sentence level   TODAY'S TREATMENT:                                                                                                                                         DATE:    10/27/22: Patient averaged 65 dB with occasional min-A reminder to speak louder and visual feedback by reading the audio reader. Vocal volume decreased during unstructured conversation, and patient was unable to carry loud voice over without prompting. Calendar drafted of two household chores of vacuuming and washing clothes. Targeted voice through [employer] project to lead through structured and unstructured conversation.   10/24/22 (eval day): Initiated training in compensations for memory, attention, awareness including reinstating calendar and to do list - need for cues initially for 2-3 weeks to get Antonio Adams in the habit of using the calendar and list. Cognitive activities to do at home, use of schedule for am meds. Initiated training in HEP for dysarthria - Loud Ah averaged 80dB with frequent mod verbal cues, modeling. Oral reading conversational phrases averaged 68dB with usual mod modeling, verbal and gesture cues.   PATIENT EDUCATION: Education details: See Today's Treatment, Patient Instructions, compensations for cognition; HEP for dysarthria Person educated: Patient and Spouse Education method: Explanation, Demonstration, Verbal cues, and Handouts Education comprehension: verbal cues  required and needs further education   GOALS: Goals reviewed with patient? Yes  SHORT TERM GOALS: Target date: 11/21/22  Pt will average 85dB on loud Ah with rare min A Baseline: Goal status: INITIAL  2.  Pt will average 72dB on 18/20 10 word sentences with rare min A Baseline:  Goal status: IN PROGRESS  3.  Pt will use calendar and to do list to complete 2 household chores a day 5/7 days and recall appointments, social events with occasional min A from fiance Baseline:  Goal status: IN PROGRESS  4.  Pt will plan ahead 4 healthy meals for restaurants for 1 week using internet menus with occasional min A to target executive function and decision making Baseline:  Goal status: IN PROGRESS  5.  Pt will manage AM meds with external aids and occasional min A from fiance over 1 week Baseline:  Goal status: IN PROGRESS  6.  Pt will keep a schedule of getting out of bed upon waking for 2 hours before napping Baseline:  Goal status: IN PROGRESS  LONG TERM GOALS: Target date: 01/16/23  Pt will manage am meds with none missed with rare min A from family over 1 week Baseline:  Goal status: IN PROGRESS  2.  Pt will use calendar and to do list to be ready for appointments and complete daily household tasks with rare min A from family  Baseline:  Goal status: IN PROGRESS  3.  Pt will average 70dB in structure speech tasks for 15 minutes with rare min A Baseline:  Goal status: IN PROGRESS  4.  Pt will average 70dB over 18 minute conversation with rare min A Baseline:  Goal status: IN PROGRESS  5.  Pt will require 2 or less requests for repetition when eating out in noisy environment Baseline:  Goal status: IN PROGRESS  6.  Pt will plan and attend 2 social events (men's breakfast, family event) using external aids to recall his ride and times Baseline:  Goal status: IN PROGRESS  ASSESSMENT:  CLINICAL IMPRESSION: Patient is a 77 y.o. male who was seen today for cognition and  dysarthria. He is known to [place] from prior course of ST where he made good progress and was managing his meds, paying bills, managing his appointments and schedules. Since prior course, he has had a second CVA. His fiance [name] reports significant decline. Today he presents with moderate cognitive communication impairment and moderate hypokinetic dysarthria. Since 2nd CVA Antonio Adams is having to manage all medications and bills. She is having to stay at his house am through bed time due to cognitive impairments. She endorses his volume is soft and she frequently needs him to repeat. She notes that wait staff often ask him to repeat as well. At this time, Antonio Adams observes poor initiation. Prior to 2nd CVA Antonio Adams was fastidious with cleaning and organizing his home. He was doing his own laundry which she is now doing. He is not completing any chores. Antonio Adams wakes up, asks Antonio Adams his schedule and if he has nothing, he stays in bed and goes back to sleep until noon or 1:00. Antonio Adams is concerned that he is not taking his am meds until 1:00 so she has to go over, wake him and give him his meds. Antonio Adams verbalizes no awareness of his change In speech or his lack of initiation and poor sleep schedule. Antonio Adams and [employer] both endorse that he does not cook, but they eat out every day. This is concerning due to recurrent CVA.  After trials of exuberant voice therapy, he did note" I was in the [employer] and I would have to talk louder than this" I recommend skilled ST to maximize intelligibility and cognition for safety, independence and to reduce caregiver burden.   OBJECTIVE IMPAIRMENTS: include attention, memory, awareness, executive functioning, expressive  language, and dysarthria. These impairments are limiting patient from managing medications, managing appointments, managing finances, household responsibilities, ADLs/IADLs, and effectively communicating at home and in community. Factors affecting potential to achieve goals and  functional outcome are previous level of function.. Patient will benefit from skilled SLP services to address above impairments and improve overall function.  REHAB POTENTIAL: Good  PLAN:  SLP FREQUENCY: 2x/week  SLP DURATION: 12 weeks  PLANNED INTERVENTIONS: Language facilitation, Cueing hierachy, Cognitive reorganization, Internal/external aids, Oral motor exercises, Functional tasks, Multimodal communication approach, and SLP instruction and feedback    Lovvorn, Radene Journey, CCC-SLP 10/27/2022, 2:18 PM

## 2022-10-30 ENCOUNTER — Ambulatory Visit (HOSPITAL_COMMUNITY)
Admission: RE | Admit: 2022-10-30 | Discharge: 2022-10-30 | Disposition: A | Discharge status: Home / Self Care | Payer: Medicare Other | Source: Ambulatory Visit | Attending: Nephrology | Admitting: Nephrology

## 2022-10-30 ENCOUNTER — Other Ambulatory Visit: Payer: Self-pay

## 2022-10-30 ENCOUNTER — Emergency Department (HOSPITAL_COMMUNITY)
Admission: EM | Admit: 2022-10-30 | Discharge: 2022-10-30 | Disposition: A | Discharge status: Home / Self Care | Payer: Medicare Other | Attending: Emergency Medicine | Admitting: Emergency Medicine

## 2022-10-30 ENCOUNTER — Emergency Department (HOSPITAL_COMMUNITY): Payer: Medicare Other

## 2022-10-30 ENCOUNTER — Ambulatory Visit: Payer: Medicare Other | Admitting: Speech Pathology

## 2022-10-30 ENCOUNTER — Encounter (HOSPITAL_COMMUNITY): Payer: Self-pay

## 2022-10-30 VITALS — BP 126/88 | HR 53 | Temp 97.9°F | Resp 17

## 2022-10-30 DIAGNOSIS — R0602 Shortness of breath: Secondary | ICD-10-CM | POA: Diagnosis not present

## 2022-10-30 DIAGNOSIS — N189 Chronic kidney disease, unspecified: Secondary | ICD-10-CM | POA: Diagnosis not present

## 2022-10-30 DIAGNOSIS — R110 Nausea: Secondary | ICD-10-CM | POA: Diagnosis not present

## 2022-10-30 DIAGNOSIS — R531 Weakness: Secondary | ICD-10-CM | POA: Diagnosis not present

## 2022-10-30 DIAGNOSIS — Z79899 Other long term (current) drug therapy: Secondary | ICD-10-CM | POA: Insufficient documentation

## 2022-10-30 DIAGNOSIS — C649 Malignant neoplasm of unspecified kidney, except renal pelvis: Secondary | ICD-10-CM

## 2022-10-30 DIAGNOSIS — N185 Chronic kidney disease, stage 5: Secondary | ICD-10-CM | POA: Insufficient documentation

## 2022-10-30 DIAGNOSIS — Z992 Dependence on renal dialysis: Secondary | ICD-10-CM | POA: Insufficient documentation

## 2022-10-30 DIAGNOSIS — J811 Chronic pulmonary edema: Secondary | ICD-10-CM | POA: Diagnosis not present

## 2022-10-30 DIAGNOSIS — C7A093 Malignant carcinoid tumor of the kidney: Secondary | ICD-10-CM | POA: Insufficient documentation

## 2022-10-30 DIAGNOSIS — I129 Hypertensive chronic kidney disease with stage 1 through stage 4 chronic kidney disease, or unspecified chronic kidney disease: Secondary | ICD-10-CM | POA: Diagnosis not present

## 2022-10-30 LAB — CBC WITH DIFFERENTIAL/PLATELET
Abs Immature Granulocytes: 0.01 10*3/uL (ref 0.00–0.07)
Basophils Absolute: 0.1 10*3/uL (ref 0.0–0.1)
Basophils Relative: 2 %
Eosinophils Absolute: 0.1 10*3/uL (ref 0.0–0.5)
Eosinophils Relative: 3 %
HCT: 31.5 % — ABNORMAL LOW (ref 39.0–52.0)
Hemoglobin: 10 g/dL — ABNORMAL LOW (ref 13.0–17.0)
Immature Granulocytes: 0 %
Lymphocytes Relative: 19 %
Lymphs Abs: 0.6 10*3/uL — ABNORMAL LOW (ref 0.7–4.0)
MCH: 30.1 pg (ref 26.0–34.0)
MCHC: 31.7 g/dL (ref 30.0–36.0)
MCV: 94.9 fL (ref 80.0–100.0)
Monocytes Absolute: 0.5 10*3/uL (ref 0.1–1.0)
Monocytes Relative: 16 %
Neutro Abs: 1.8 10*3/uL (ref 1.7–7.7)
Neutrophils Relative %: 60 %
Platelets: 172 10*3/uL (ref 150–400)
RBC: 3.32 MIL/uL — ABNORMAL LOW (ref 4.22–5.81)
RDW: 16.4 % — ABNORMAL HIGH (ref 11.5–15.5)
WBC: 3 10*3/uL — ABNORMAL LOW (ref 4.0–10.5)
nRBC: 0 % (ref 0.0–0.2)

## 2022-10-30 LAB — COMPREHENSIVE METABOLIC PANEL
ALT: 31 U/L (ref 0–44)
AST: 24 U/L (ref 15–41)
Albumin: 3 g/dL — ABNORMAL LOW (ref 3.5–5.0)
Alkaline Phosphatase: 87 U/L (ref 38–126)
Anion gap: 16 — ABNORMAL HIGH (ref 5–15)
BUN: 65 mg/dL — ABNORMAL HIGH (ref 8–23)
CO2: 16 mmol/L — ABNORMAL LOW (ref 22–32)
Calcium: 9.3 mg/dL (ref 8.9–10.3)
Chloride: 106 mmol/L (ref 98–111)
Creatinine, Ser: 9.38 mg/dL — ABNORMAL HIGH (ref 0.61–1.24)
GFR, Estimated: 5 mL/min — ABNORMAL LOW (ref >60)
Glucose, Bld: 114 mg/dL — ABNORMAL HIGH (ref 70–99)
Potassium: 4.6 mmol/L (ref 3.5–5.1)
Sodium: 138 mmol/L (ref 135–145)
Total Bilirubin: 0.5 mg/dL (ref 0.3–1.2)
Total Protein: 6 g/dL — ABNORMAL LOW (ref 6.5–8.1)

## 2022-10-30 LAB — IRON AND TIBC
Iron: 61 ug/dL (ref 45–182)
Saturation Ratios: 24 % (ref 17.9–39.5)
TIBC: 259 ug/dL (ref 250–450)
UIBC: 198 ug/dL

## 2022-10-30 LAB — I-STAT CHEM 8, ED
BUN: 57 mg/dL — ABNORMAL HIGH (ref 8–23)
Calcium, Ion: 1.27 mmol/L (ref 1.15–1.40)
Chloride: 109 mmol/L (ref 98–111)
Creatinine, Ser: 10.8 mg/dL — ABNORMAL HIGH (ref 0.61–1.24)
Glucose, Bld: 110 mg/dL — ABNORMAL HIGH (ref 70–99)
HCT: 30 % — ABNORMAL LOW (ref 39.0–52.0)
Hemoglobin: 10.2 g/dL — ABNORMAL LOW (ref 13.0–17.0)
Potassium: 4.5 mmol/L (ref 3.5–5.1)
Sodium: 139 mmol/L (ref 135–145)
TCO2: 19 mmol/L — ABNORMAL LOW (ref 22–32)

## 2022-10-30 LAB — PROTIME-INR
INR: 1.1 (ref 0.8–1.2)
Prothrombin Time: 13.9 seconds (ref 11.4–15.2)

## 2022-10-30 LAB — TROPONIN I (HIGH SENSITIVITY)
Troponin I (High Sensitivity): 94 ng/L — ABNORMAL HIGH (ref <18)
Troponin I (High Sensitivity): 94 ng/L — ABNORMAL HIGH (ref <18)

## 2022-10-30 LAB — FERRITIN: Ferritin: 353 ng/mL — ABNORMAL HIGH (ref 24–336)

## 2022-10-30 LAB — CBG MONITORING, ED: Glucose-Capillary: 115 mg/dL — ABNORMAL HIGH (ref 70–99)

## 2022-10-30 LAB — LACTIC ACID, PLASMA
Lactic Acid, Venous: 1.3 mmol/L (ref 0.5–1.9)
Lactic Acid, Venous: 1.3 mmol/L (ref 0.5–1.9)

## 2022-10-30 LAB — POCT HEMOGLOBIN-HEMACUE: Hemoglobin: 10.3 g/dL — ABNORMAL LOW (ref 13.0–17.0)

## 2022-10-30 MED ORDER — EPOETIN ALFA-EPBX 40000 UNIT/ML IJ SOLN
INTRAMUSCULAR | Status: AC
  Filled 2022-10-30: qty 1

## 2022-10-30 MED ORDER — LACTATED RINGERS IV BOLUS
500.0000 mL | Freq: Once | INTRAVENOUS | Status: AC
  Administered 2022-10-30: 500 mL via INTRAVENOUS

## 2022-10-30 MED ORDER — EPOETIN ALFA-EPBX 40000 UNIT/ML IJ SOLN
30000.0000 [IU] | INTRAMUSCULAR | Status: DC
  Administered 2022-10-30: 30000 [IU] via SUBCUTANEOUS

## 2022-10-30 NOTE — ED Provider Notes (Signed)
[place] EMERGENCY DEPARTMENT AT [facility] Provider Note   CSN: [id number] Arrival date & time: 10/30/22  1026     History  Chief Complaint  Patient presents with   Weakness    Antonio Adams is a 77 y.o. male.  HPI Was at the infusion center for a Retacrit injection which he has had on prior occasions.  Got his injection and then was going out to the car with his companion.  He suddenly felt very generally weak and nauseated.  Got clammy.  They went back inside for recheck and at that point determined the patient should get evaluated the emergency department.  The patient reports he just feels generally bad.  He denies that he specifically has chest pain shortness of breath or any other area of pain.  He is sweaty.  He denies feeling is nauseated now as he did previously.    Home Medications Prior to Admission medications   Medication Sig Start Date End Date Taking? Authorizing Provider  acetaminophen (TYLENOL) 500 MG tablet Take 1,000 mg by mouth every 6 (six) hours as needed for mild pain.    [provider]  atorvastatin (LIPITOR) 80 MG tablet Take 1 tablet (80 mg total) by mouth daily. 08/26/22   Almon Hercules, MD  calcitRIOL (ROCALTROL) 0.25 MCG capsule Take 0.25 mcg by mouth daily. 01/05/22   [provider]  clopidogrel (PLAVIX) 75 MG tablet Take 1 tablet (75 mg total) by mouth daily. 08/30/22   Marcos Eke, PA-C  Cyanocobalamin (B-12 PO) Take 1,000 mcg by mouth daily.    [provider]  cyclobenzaprine (FLEXERIL) 5 MG tablet Take 1 tablet (5 mg total) by mouth 2 (two) times daily as needed for muscle spasms. 03/19/22   Blane Ohara, MD  donepezil (ARICEPT) 10 MG tablet Take half tablet (5 mg) daily for 2 weeks, then one tablet daily. 10/18/22   Marcos Eke, PA-C  DULoxetine (CYMBALTA) 20 MG capsule Take 1 capsule (20 mg total) by mouth daily. 05/29/22   Pincus Sanes, MD  ferrous sulfate 325 (65 FE) MG tablet Take 1 tablet  (325 mg total) by mouth 2 (two) times daily. Patient needs office visit for further refills Patient taking differently: Take 325 mg by mouth daily with breakfast. 02/24/19   Hilarie Fredrickson, MD  fluocinonide-emollient (LIDEX-E) 0.05 % cream Apply 1 Application topically 2 (two) times daily. 08/30/22   Etta Grandchild, MD  furosemide (LASIX) 40 MG tablet TAKE 1 TABLET BY MOUTH EVERY DAY 06/22/22   Etta Grandchild, MD  isosorbide mononitrate (IMDUR) 60 MG 24 hr tablet Take 1 tablet (60 mg total) by mouth daily. 09/01/22 11/30/22  Alver Sorrow, NP  metoprolol tartrate (LOPRESSOR) 25 MG tablet TAKE 1 TABLET BY MOUTH TWICE A DAY 06/22/22   Jodelle Red, MD  mirtazapine (REMERON SOL-TAB) 15 MG disintegrating tablet Take 15 mg by mouth at bedtime.    [provider]  pantoprazole (PROTONIX) 20 MG tablet Take 1 tablet (20 mg total) by mouth daily. Patient taking differently: Take 20 mg by mouth daily as needed for heartburn or indigestion. 01/08/22   Azucena Fallen, MD  polyvinyl alcohol (LIQUIFILM TEARS) 1.4 % ophthalmic solution Place 1 drop into both eyes daily as needed for dry eyes.    [provider]  sodium bicarbonate 650 MG tablet Take 2 tablets (1,300 mg total) by mouth 2 (two) times daily. 04/09/22   Ghimire, Werner Lean, MD  solifenacin (  VESICARE) 5 MG tablet TAKE 1 TABLET (5 MG TOTAL) BY MOUTH DAILY. 10/02/22   Etta Grandchild, MD  tamsulosin (FLOMAX) 0.4 MG CAPS capsule TAKE 1 CAPSULE (0.4 MG TOTAL) BY MOUTH DAILY. Patient taking differently: Take 0.4 mg by mouth daily. 01/20/14   Etta Grandchild, MD  traMADol (ULTRAM) 50 MG tablet Take 1 tablet (50 mg total) by mouth every 12 (twelve) hours as needed. 09/19/22   Cristie Hem, PA-C  zinc gluconate 50 MG tablet Take 1 tablet (50 mg total) by mouth daily. 08/30/22   Etta Grandchild, MD      Allergies    Ace inhibitors, Gabapentin, Xyzal [levocetirizine], and Opdivo [nivolumab]    Review of Systems   Review of  Systems  Physical Exam Updated Vital Signs BP (!) 160/90   Pulse 64   Temp 97.8 F (36.6 C) (Oral)   Resp 20   Ht 6\' 3"  (1.905 m)   Wt 91 kg   SpO2 97%   BMI 25.08 kg/m  Physical Exam Constitutional:      Comments: Alert well-developed.  Patient seems drowsy or feeling unwell.  He is somnolent but follows commands.  No respiratory distress.  HENT:     Head: Normocephalic and atraumatic.     Mouth/Throat:     Mouth: Mucous membranes are moist.     Pharynx: Oropharynx is clear.  Eyes:     Extraocular Movements: Extraocular movements intact.  Cardiovascular:     Rate and Rhythm: Normal rate and regular rhythm.  Pulmonary:     Effort: Pulmonary effort is normal.     Breath sounds: Normal breath sounds.  Abdominal:     General: There is no distension.     Palpations: Abdomen is soft.     Tenderness: There is no abdominal tenderness. There is no guarding.  Musculoskeletal:        General: No tenderness.     Right lower leg: Edema present.     Left lower leg: Edema present.     Comments: Patient has about 1+ 2+ pitting edema bilateral lower extremities symmetrically.  No calf pain.  Skin:    General: Skin is warm and dry.  Neurological:     Comments: Patient is sitting up in the stretcher but seems very somnolent or fatigued.  He is nodding in response to questions and verbalizes a little bit.  Will follow commands to do grip strength but generally seems weak all over.  He makes effort to elevate each lower extremity independently off of the bed but does not have much strength to elevate and hold.  All of this is symmetric.     ED Results / Procedures / Treatments   Labs (all labs ordered are listed, but only abnormal results are displayed) Labs Reviewed  COMPREHENSIVE METABOLIC PANEL - Abnormal; Notable for the following components:      Result Value   CO2 16 (*)    Glucose, Bld 114 (*)    BUN 65 (*)    Creatinine, Ser 9.38 (*)    Total Protein 6.0 (*)    Albumin 3.0  (*)    GFR, Estimated 5 (*)    Anion gap 16 (*)    All other components within normal limits  CBC WITH DIFFERENTIAL/PLATELET - Abnormal; Notable for the following components:   WBC 3.0 (*)    RBC 3.32 (*)    Hemoglobin 10.0 (*)    HCT 31.5 (*)    RDW 16.4 (*)  Lymphs Abs 0.6 (*)    All other components within normal limits  CBG MONITORING, ED - Abnormal; Notable for the following components:   Glucose-Capillary 115 (*)    All other components within normal limits  I-STAT CHEM 8, ED - Abnormal; Notable for the following components:   BUN 57 (*)    Creatinine, Ser 10.80 (*)    Glucose, Bld 110 (*)    TCO2 19 (*)    Hemoglobin 10.2 (*)    HCT 30.0 (*)    All other components within normal limits  TROPONIN I (HIGH SENSITIVITY) - Abnormal; Notable for the following components:   Troponin I (High Sensitivity) 94 (*)    All other components within normal limits  TROPONIN I (HIGH SENSITIVITY) - Abnormal; Notable for the following components:   Troponin I (High Sensitivity) 94 (*)    All other components within normal limits  LACTIC ACID, PLASMA  LACTIC ACID, PLASMA  PROTIME-INR    EKG EKG Interpretation  Date/Time:  Monday October 30 2022 10:47:31 EDT Ventricular Rate:  67 PR Interval:  205 QRS Duration: 85 QT Interval:  447 QTC Calculation: 472 R Axis:   14 Text Interpretation: Sinus rhythm Probable left atrial enlargement Abnormal R-wave progression, early transition LVH with secondary repolarization abnormality no sig change from previous Confirmed by Arby Barrette [phone]) on 10/30/2022 11:08:44 AM  Radiology DG Chest Port 1 View  Result Date: 10/30/2022 CLINICAL DATA:  sob EXAM: PORTABLE CHEST 1 VIEW COMPARISON:  Chest x-ray April 11, 24. FINDINGS: Limited study with mild streaky left basilar opacities. No confluent consolidation. Enlarged cardiac silhouette. Pulmonary vascular congestion. Suspected small bilateral pleural effusions. No visible pneumothorax. ACDF,  partially imaged. IMPRESSION: 1. Cardiomegaly and pulmonary vascular congestion. No overt pulmonary edema. 2. Suspected small bilateral pleural effusions. 3. Limited study with mild streaky left basilar opacities, favor atelectasis. Electronically Signed   By: Feliberto Harts M.D.   On: 10/30/2022 12:45    Procedures Procedures    Medications Ordered in ED Medications  lactated ringers bolus 500 mL (0 mLs Intravenous Stopped 10/30/22 1139)    ED Course/ Medical Decision Making/ A&P                             Medical Decision Making Amount and/or Complexity of Data Reviewed Labs: ordered. Radiology: ordered.   Patient has complex medical history.  He had just gotten a Retacrit injection for chronic anemia.  He had acute onset of significant sudden weakness and nausea.  Differential diagnosis includes allergic or adverse reaction to Retacrit\ACS\metabolic derangement\acute infectious source.  At this time will initiate volume resuscitation with lactated Ringer's.  Initial blood pressure is 109/70.  Patient reports this is low for him and typically has higher blood pressure.  Have reviewed EKG and there is no acute finding and no change from previous.  CBG on arrival 115.  Continue to monitor closely and proceed with additional diagnostic evaluation.  Patient's blood pressure soft on arrival.  He has had light hydration and been able to stand up at bedside to use the bathroom and sipping fluids.  Blood pressures have steadily rebounded.   Patient has end-stage kidney failure but is not on dialysis.  He has history of cell carcinoma that is metastatic.  Patient and his wife advised that they do not intend to do dialysis.   His troponin is at 94.  This is flat with repeat unchanged.  At this time  I suspect the troponin elevation is secondary to kidney failure.  Patient had previous episode of elevated troponins in the 300s that were flat.  Patient denies that he had chest pain.  At this  time do not think patient is experiencing NSTEMI.  No signs of acute infection.  I have low suspicion for adverse reaction to Retacrit.  Patient has had it multiple times previously.  Symptoms ultimately spontaneously resolved as blood pressure normalized.  No intervention required.  This time plan will be for return to home with careful outpatient monitoring.        Final Clinical Impression(s) / ED Diagnoses Final diagnoses:  Generalized weakness  Stage 5 chronic kidney disease not on chronic dialysis (HCC)  Carcinoma of kidney, unspecified laterality [facility])    Rx / DC Orders ED Discharge Orders     None         Arby Barrette, MD 10/30/22 1608

## 2022-10-30 NOTE — Therapy (Deleted)
OUTPATIENT SPEECH LANGUAGE PATHOLOGY TREATMENT   Patient Name: Antonio Adams MRN: [id number] DOB:Oct 12, 1945, 77 y.o., male Today's Date: 10/30/2022  PCP: Etta Grandchild, MD REFERRING PROVIDER: Etta Grandchild, MD  END OF SESSION:     Past Medical History:  Diagnosis Date   ADENOCARCINOMA, PROSTATE, GLEASON GRADE 6 12/21/2009   prostate cancer   ALLERGIC RHINITIS 03/16/2007   Allergy    Anxiety    Arthritis    back    BACK PAIN WITH RADICULOPATHY 11/23/2008   Cancer of kidney (HCC)    partial right kidney removed   CARPAL TUNNEL SYNDROME, BILATERAL 03/16/2007   Chronic back pain    Constipation    takes Senokot daily   Depression    occasionally   DIABETES MELLITUS, TYPE II 03/16/2007   takes Januvia and MEtformin daily   GASTROINTESTINAL HEMORRHAGE, HX OF 03/16/2007   GERD 03/16/2007   takes Omeprazole daily   Glaucoma    mild - no eye drops   Hemorrhoids    History of colon polyps    HYPERLIPIDEMIA 03/16/2007   takes Crestor daily   HYPERTENSION 03/16/2007   takes Diltiazem and Lisinopril daily    Kidney cancer, primary, with metastasis from kidney to other site [facility])    LEG CRAMPS 05/22/2007   Nocturia    Overactive bladder    PARESTHESIA 05/21/2007   Proctitis    PTSD (post-traumatic stress disorder)    wakes up may be dreaming of fighting   Rectal bleeding    Dr.Norins has explained its from the Radiation that he has received   Rectal bleeding    Renal cell carcinoma (HCC)    Sleep apnea    uses CPAP nightly   Stomach cancer (HCC)    Urinary frequency    takes Toviaz daily   Past Surgical History:  Procedure Laterality Date   ADRENALECTOMY Right 02/26/2020   Procedure: OPEN RIGHT RENAL CELL METASTATECTOMY AND  ADRENALECTOMY;  Surgeon: Crist Fat, MD;  Location: [facility];  Service: Urology;  Laterality: Right;   BACK SURGERY  1983, 2008   repeat surgery; ESI '08   CARPAL TUNNEL RELEASE     bilateral   CERVICAL FUSION     COLONOSCOPY      colonosocpy     ESOPHAGOGASTRODUODENOSCOPY     ED   FLEXIBLE SIGMOIDOSCOPY N/A 01/08/2017   Procedure: FLEXIBLE SIGMOIDOSCOPY;  Surgeon: Hilarie Fredrickson, MD;  Location: [facility];  Service: Endoscopy;  Laterality: N/A;   HOT HEMOSTASIS N/A 01/08/2017   Procedure: HOT HEMOSTASIS (ARGON PLASMA COAGULATION/BICAP);  Surgeon: Hilarie Fredrickson, MD;  Location: [facility] ENDOSCOPY;  Service: Endoscopy;  Laterality: N/A;   IR RADIOLOGIST EVAL & MGMT  12/17/2019   IR RADIOLOGIST EVAL & MGMT  07/01/2020   IR RADIOLOGIST EVAL & MGMT  08/25/2020   LUMBAR LAMINECTOMY  10/30/2011   Procedure: MICRODISCECTOMY LUMBAR LAMINECTOMY;  Surgeon: Kerrin Champagne, MD;  Location: [facility] OR;  Service: Orthopedics;  Laterality: Right;  Right L4-5 and L5-S1 Microdiscectomy   LUMBAR LAMINECTOMY  06/17/2012   Procedure: MICRODISCECTOMY LUMBAR LAMINECTOMY;  Surgeon: Kerrin Champagne, MD;  Location: [facility] OR;  Service: Orthopedics;  Laterality: N/A;  Right L5-S1 microdiscectomy   OPEN PARTIAL HEPATECTOMY  N/A 02/26/2020   Procedure: OPEN PARTIAL HEPATECTOMY, CHOLECYSTECTOMY;  Surgeon: Almond Lint, MD;  Location: [facility];  Service: General;  Laterality: N/A;   POLYPECTOMY     PROSTATE SURGERY  march 2012   seed implant   RADIOFREQUENCY ABLATION N/A  07/21/2020   Procedure: CT MICROWAVE ABLATION;  Surgeon: Sterling Big, MD;  Location: [facility];  Service: Anesthesiology;  Laterality: N/A;   ROBOTIC ASSITED PARTIAL NEPHRECTOMY Right 09/08/2015   Procedure: XI ROBOTIC ASSITED RIGHT PARTIAL NEPHRECTOMY;  Surgeon: Crist Fat, MD;  Location: [facility];  Service: Urology;  Laterality: Right;  Clamp on: 1033 Clamp off: 1103 Total Clamp Time: 30 minutes   SHOULDER ARTHROSCOPY WITH SUBACROMIAL DECOMPRESSION Left 01/20/2016   Procedure: LEFT SHOULDER ARTHROSCOPY WITH EXTENSIVE  DEBRIDEMENT, ACROMIOPLASTY;  Surgeon: Loreta Ave, MD;  Location: [facility];  Service: Orthopedics;  Laterality: Left;   SHOULDER SURGERY  LFT   Stress Cardiolite   12/24/2003   negative for ischemia   UPPER ESOPHAGEAL ENDOSCOPIC ULTRASOUND (EUS)  04/18/2016   [facility]   UPPER GASTROINTESTINAL ENDOSCOPY     WOUND EXPLORATION     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   Patient Active Problem List   Diagnosis Date Noted   Traumatic coccydynia 08/30/2022   Acute ischemic stroke (HCC) 08/24/2022   Encounter for general adult medical examination with abnormal findings 01/23/2022   Anemia due to zinc deficiency 12/20/2021   Symptomatic anemia 12/15/2021   Cancer related pain 12/15/2021   Chronic heart failure with preserved ejection fraction (HFpEF) (HCC) 08/12/2021   Tinea cruris 08/12/2021   Anemia due to chronic kidney disease 07/20/2021   CKD (chronic kidney disease) stage 5, GFR less than 15 ml/min (HCC) 05/24/2021   BPH (benign prostatic hyperplasia) 05/24/2021   Senile nuclear sclerosis 05/20/2021   Vitamin D deficiency 05/20/2021   Vitreous degeneration, unspecified eye 05/20/2021   Intrinsic eczema 04/18/2021   Atherosclerosis of aorta (HCC) 12/30/2020   Mixed conductive and sensorineural hearing loss of left ear with restricted hearing of right ear 09/01/2020   Metastatic renal cell carcinoma (HCC) 02/26/2020   Diabetic polyneuropathy associated with type 2 diabetes mellitus (HCC) 12/18/2019   Murmur, cardiac 10/08/2018   DDD (degenerative disc disease), cervical 10/08/2018   OAB (overactive bladder) 04/04/2018   Seasonal allergic rhinitis due to pollen 11/01/2016   Post traumatic stress disorder (PTSD) 01/29/2014   Spinal stenosis of lumbar region with radiculopathy 10/30/2011    Class: Chronic   Obstructive sleep apnea 11/24/2010   Cognitive impairment 11/24/2010   ADENOCARCINOMA, PROSTATE, GLEASON GRADE 6 12/21/2009   Essential hypertension 03/16/2007   GERD 03/16/2007    ONSET DATE: 08/24/22   REFERRING DIAG:  THERAPY DIAG:  I63.9 (ICD-10-CM) - Acute ischemic stroke (HCC)  Rationale for Evaluation and  Treatment: Rehabilitation  SUBJECTIVE:   SUBJECTIVE STATEMENT: *** Pt accompanied by:  fiance  PERTINENT HISTORY: Pt is a 77 y.o. male who presents with episode of confusion/hallucinations. MRI (08/24/22) revealed "Acute left parieto-occipital periventricular infarcts in the region of prior infarct". SLE after previous CVA (04/09/22) noted cognitive impairment (memory/problem solving) with pt receiving OP SLP services post discharge. PMH: GERD, prior L PCA stroke, metastatic RCC, prostate cancer, stage V CKD declining dialysis.   PAIN:  Are you having pain? No  FALLS: Has patient fallen in last 6 months?  See PT evaluation for details  LIVING ENVIRONMENT: Lives with: lives alone - fiance staying with him during the day until bedtime Lives in: House/apartment  PLOF:  Level of assistance: Independent with ADLs, Needed assistance with IADLS Employment: Retired  PATIENT GOALS: To manage AM meds, calendar, to get back to doing things" fiance  OBJECTIVE:   DIAGNOSTIC FINDINGS: Acute left parieto-occipital periventricular infarcts  in the region of prior infarct.  COGNITION: Overall cognitive status: Impaired and Decline since CVA and course of ST in Feb 2024 Areas of impairment:  Attention: Impaired: Sustained, Selective, Alternating Memory: Impaired: Immediate Working Short term Engineering geologist Awareness: Impaired: Intellectual Executive function: Impaired: Initiation, Problem solving, Organization, Planning, Self-correction, and Slow processing Behavior: WNL Functional deficits: meds, bills, household chores, humor  COGNITIVE COMMUNICATION: Following directions: Follows one step commands consistently  Auditory comprehension: Impaired: due to slow processing Verbal expression: Impaired: word finding difficulty noted in naming restaurants he frequents Functional communication: WFL  ORAL MOTOR EXAMINATION: Overall status: WFL Comments:   STANDARDIZED  ASSESSMENTS: N/A - moderate impairments on CLQT prior eval   MOTOR SPEECH:  Volume: Averaged 54dB in conversation Low vocal intensity Voice and Resonance WNL Motor Planning WNL Intelligibility reduced at sentence level   TODAY'S TREATMENT:                                                                                                                                         DATE:   10/30/22: ***   10/27/22: Patient averaged 65 dB with occasional min-A reminder to speak louder and visual feedback by reading the audio reader. Vocal volume decreased during unstructured conversation, and patient was unable to carry loud voice over without prompting. Calendar drafted of two household chores of vacuuming and washing clothes. Targeted voice through [employer] project to lead through structured and unstructured conversation.   10/24/22 (eval day): Initiated training in compensations for memory, attention, awareness including reinstating calendar and to do list - need for cues initially for 2-3 weeks to get Jonath in the habit of using the calendar and list. Cognitive activities to do at home, use of schedule for am meds. Initiated training in HEP for dysarthria - Loud Ah averaged 80dB with frequent mod verbal cues, modeling. Oral reading conversational phrases averaged 68dB with usual mod modeling, verbal and gesture cues.   PATIENT EDUCATION: Education details: See Today's Treatment, Patient Instructions, compensations for cognition; HEP for dysarthria Person educated: Patient and Spouse Education method: Explanation, Demonstration, Verbal cues, and Handouts Education comprehension: verbal cues required and needs further education   GOALS: Goals reviewed with patient? Yes  SHORT TERM GOALS: Target date: 11/21/22  Pt will average 85dB on loud Ah with rare min A Baseline: Goal status: INITIAL  2.  Pt will average 72dB on 18/20 10 word sentences with rare min A Baseline:  Goal status:  IN PROGRESS  3.  Pt will use calendar and to do list to complete 2 household chores a day 5/7 days and recall appointments, social events with occasional min A from fiance Baseline:  Goal status: IN PROGRESS  4.  Pt will plan ahead 4 healthy meals for restaurants for 1 week using internet menus with occasional min A to target executive function and decision making Baseline:  Goal status: IN PROGRESS  5.  Pt will manage AM meds with external aids and occasional min A from fiance over 1 week Baseline:  Goal status: IN PROGRESS  6.  Pt will keep a schedule of getting out of bed upon waking for 2 hours before napping Baseline:  Goal status: IN PROGRESS  LONG TERM GOALS: Target date: 01/16/23  Pt will manage am meds with none missed with rare min A from family over 1 week Baseline:  Goal status: IN PROGRESS  2.  Pt will use calendar and to do list to be ready for appointments and complete daily household tasks with rare min A from family  Baseline:  Goal status: IN PROGRESS  3.  Pt will average 70dB in structure speech tasks for 15 minutes with rare min A Baseline:  Goal status: IN PROGRESS  4.  Pt will average 70dB over 18 minute conversation with rare min A Baseline:  Goal status: IN PROGRESS  5.  Pt will require 2 or less requests for repetition when eating out in noisy environment Baseline:  Goal status: IN PROGRESS  6.  Pt will plan and attend 2 social events (men's breakfast, family event) using external aids to recall his ride and times Baseline:  Goal status: IN PROGRESS  ASSESSMENT:  CLINICAL IMPRESSION: Patient is a 77 y.o. male who was seen today for cognition and dysarthria. He is known to [place] from prior course of ST where he made good progress and was managing his meds, paying bills, managing his appointments and schedules. Since prior course, he has had a second CVA. His fiance [name] reports significant decline. Today he presents with moderate cognitive  communication impairment and moderate hypokinetic dysarthria. Since 2nd CVA Carolan Clines is having to manage all medications and bills. She is having to stay at his house am through bed time due to cognitive impairments. She endorses his volume is soft and she frequently needs him to repeat. She notes that wait staff often ask him to repeat as well. At this time, Pear observes poor initiation. Prior to 2nd CVA Teren was fastidious with cleaning and organizing his home. He was doing his own laundry which she is now doing. He is not completing any chores. Mitsuru wakes up, asks Pear his schedule and if he has nothing, he stays in bed and goes back to sleep until noon or 1:00. Carolan Clines is concerned that he is not taking his am meds until 1:00 so she has to go over, wake him and give him his meds. Jaeden verbalizes no awareness of his change In speech or his lack of initiation and poor sleep schedule. Leonette Most and Bluford both endorse that he does not cook, but they eat out every day. This is concerning due to recurrent CVA.  After trials of exuberant voice therapy, he did note" I was in the [employer] and I would have to talk louder than this" I recommend skilled ST to maximize intelligibility and cognition for safety, independence and to reduce caregiver burden.   OBJECTIVE IMPAIRMENTS: include attention, memory, awareness, executive functioning, expressive language, and dysarthria. These impairments are limiting patient from managing medications, managing appointments, managing finances, household responsibilities, ADLs/IADLs, and effectively communicating at home and in community. Factors affecting potential to achieve goals and functional outcome are previous level of function.. Patient will benefit from skilled SLP services to address above impairments and improve overall function.  REHAB POTENTIAL: Good  PLAN:  SLP FREQUENCY: 2x/week  SLP DURATION: 12 weeks  PLANNED INTERVENTIONS: Language facilitation, Cueing  hierachy, Cognitive reorganization, Internal/external aids, Oral motor exercises, Functional tasks, Multimodal communication approach, and SLP instruction and feedback    [facility], Student-SLP 10/30/2022, 9:53 AM

## 2022-10-30 NOTE — ED Notes (Signed)
Pt assisted at bedside to use urinal. Pt able to stand appropriately w minimal assistance and denies any complications at this time. Pt states he is feeling much better after rest and fluids. MD made aware.

## 2022-10-30 NOTE — Discharge Instructions (Signed)
1.  At this time I suspect your symptoms are due to your worsening kidney disease.  Try to carefully hydrate. 2.  Return to the emergency department if you get a fever or severe pain. 3.  Follow-up with your oncologist to soon as possible to discuss your ongoing care and treatment.

## 2022-10-31 ENCOUNTER — Ambulatory Visit: Payer: Medicare Other | Admitting: Physical Therapy

## 2022-11-01 ENCOUNTER — Other Ambulatory Visit: Payer: Self-pay | Admitting: Internal Medicine

## 2022-11-01 ENCOUNTER — Ambulatory Visit: Payer: Medicare Other | Admitting: Speech Pathology

## 2022-11-01 ENCOUNTER — Encounter: Payer: Self-pay | Admitting: Speech Pathology

## 2022-11-01 DIAGNOSIS — R471 Dysarthria and anarthria: Secondary | ICD-10-CM | POA: Diagnosis not present

## 2022-11-01 DIAGNOSIS — R2689 Other abnormalities of gait and mobility: Secondary | ICD-10-CM | POA: Diagnosis not present

## 2022-11-01 DIAGNOSIS — R2681 Unsteadiness on feet: Secondary | ICD-10-CM | POA: Diagnosis not present

## 2022-11-01 DIAGNOSIS — R41841 Cognitive communication deficit: Secondary | ICD-10-CM

## 2022-11-01 DIAGNOSIS — M6281 Muscle weakness (generalized): Secondary | ICD-10-CM | POA: Diagnosis not present

## 2022-11-01 NOTE — Therapy (Signed)
OUTPATIENT SPEECH LANGUAGE PATHOLOGY TREATMENT   Patient Name: Antonio Adams MRN: [id number] DOB:1946/03/13, 77 y.o., male Today's Date: 11/01/2022  PCP: Etta Grandchild, MD REFERRING PROVIDER: Etta Grandchild, MD  END OF SESSION:  End of Session - 11/01/22 1413     Visit Number 3    Number of Visits 25    Date for SLP Re-Evaluation 01/16/23    Authorization Type [employer] Medicare    SLP Start Time 1413   pt arrived late   SLP Stop Time  1445    SLP Time Calculation (min) 32 min    Activity Tolerance Patient tolerated treatment well              Past Medical History:  Diagnosis Date   ADENOCARCINOMA, PROSTATE, GLEASON GRADE 6 12/21/2009   prostate cancer   ALLERGIC RHINITIS 03/16/2007   Allergy    Anxiety    Arthritis    back    BACK PAIN WITH RADICULOPATHY 11/23/2008   Cancer of kidney (HCC)    partial right kidney removed   CARPAL TUNNEL SYNDROME, BILATERAL 03/16/2007   Chronic back pain    Constipation    takes Senokot daily   Depression    occasionally   DIABETES MELLITUS, TYPE II 03/16/2007   takes Januvia and MEtformin daily   GASTROINTESTINAL HEMORRHAGE, HX OF 03/16/2007   GERD 03/16/2007   takes Omeprazole daily   Glaucoma    mild - no eye drops   Hemorrhoids    History of colon polyps    HYPERLIPIDEMIA 03/16/2007   takes Crestor daily   HYPERTENSION 03/16/2007   takes Diltiazem and Lisinopril daily    Kidney cancer, primary, with metastasis from kidney to other site [facility])    LEG CRAMPS 05/22/2007   Nocturia    Overactive bladder    PARESTHESIA 05/21/2007   Proctitis    PTSD (post-traumatic stress disorder)    wakes up may be dreaming of fighting   Rectal bleeding    Dr.Norins has explained its from the Radiation that he has received   Rectal bleeding    Renal cell carcinoma (HCC)    Sleep apnea    uses CPAP nightly   Stomach cancer (HCC)    Urinary frequency    takes Toviaz daily   Past Surgical History:  Procedure Laterality Date    ADRENALECTOMY Right 02/26/2020   Procedure: OPEN RIGHT RENAL CELL METASTATECTOMY AND  ADRENALECTOMY;  Surgeon: Crist Fat, MD;  Location: [facility];  Service: Urology;  Laterality: Right;   BACK SURGERY  1983, 2008   repeat surgery; ESI '08   CARPAL TUNNEL RELEASE     bilateral   CERVICAL FUSION     COLONOSCOPY     colonosocpy     ESOPHAGOGASTRODUODENOSCOPY     ED   FLEXIBLE SIGMOIDOSCOPY N/A 01/08/2017   Procedure: FLEXIBLE SIGMOIDOSCOPY;  Surgeon: Hilarie Fredrickson, MD;  Location: [facility] ENDOSCOPY;  Service: Endoscopy;  Laterality: N/A;   HOT HEMOSTASIS N/A 01/08/2017   Procedure: HOT HEMOSTASIS (ARGON PLASMA COAGULATION/BICAP);  Surgeon: Hilarie Fredrickson, MD;  Location: [facility] ENDOSCOPY;  Service: Endoscopy;  Laterality: N/A;   IR RADIOLOGIST EVAL & MGMT  12/17/2019   IR RADIOLOGIST EVAL & MGMT  07/01/2020   IR RADIOLOGIST EVAL & MGMT  08/25/2020   LUMBAR LAMINECTOMY  10/30/2011   Procedure: MICRODISCECTOMY LUMBAR LAMINECTOMY;  Surgeon: Kerrin Champagne, MD;  Location: [facility] OR;  Service: Orthopedics;  Laterality: Right;  Right L4-5 and L5-S1 Microdiscectomy  LUMBAR LAMINECTOMY  06/17/2012   Procedure: MICRODISCECTOMY LUMBAR LAMINECTOMY;  Surgeon: Kerrin Champagne, MD;  Location: [facility] OR;  Service: Orthopedics;  Laterality: N/A;  Right L5-S1 microdiscectomy   OPEN PARTIAL HEPATECTOMY  N/A 02/26/2020   Procedure: OPEN PARTIAL HEPATECTOMY, CHOLECYSTECTOMY;  Surgeon: Almond Lint, MD;  Location: [facility];  Service: General;  Laterality: N/A;   POLYPECTOMY     PROSTATE SURGERY  march 2012   seed implant   RADIOFREQUENCY ABLATION N/A 07/21/2020   Procedure: CT MICROWAVE ABLATION;  Surgeon: Sterling Big, MD;  Location: [facility];  Service: Anesthesiology;  Laterality: N/A;   ROBOTIC ASSITED PARTIAL NEPHRECTOMY Right 09/08/2015   Procedure: XI ROBOTIC ASSITED RIGHT PARTIAL NEPHRECTOMY;  Surgeon: Crist Fat, MD;  Location: [facility];  Service: Urology;  Laterality: Right;  Clamp on: 1033 Clamp off: 1103 Total  Clamp Time: 30 minutes   SHOULDER ARTHROSCOPY WITH SUBACROMIAL DECOMPRESSION Left 01/20/2016   Procedure: LEFT SHOULDER ARTHROSCOPY WITH EXTENSIVE  DEBRIDEMENT, ACROMIOPLASTY;  Surgeon: Loreta Ave, MD;  Location: [facility];  Service: Orthopedics;  Laterality: Left;   SHOULDER SURGERY  LFT   Stress Cardiolite  12/24/2003   negative for ischemia   UPPER ESOPHAGEAL ENDOSCOPIC ULTRASOUND (EUS)  04/18/2016   [facility]   UPPER GASTROINTESTINAL ENDOSCOPY     WOUND EXPLORATION     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   Patient Active Problem List   Diagnosis Date Noted   Traumatic coccydynia 08/30/2022   Acute ischemic stroke (HCC) 08/24/2022   Encounter for general adult medical examination with abnormal findings 01/23/2022   Anemia due to zinc deficiency 12/20/2021   Symptomatic anemia 12/15/2021   Cancer related pain 12/15/2021   Chronic heart failure with preserved ejection fraction (HFpEF) (HCC) 08/12/2021   Tinea cruris 08/12/2021   Anemia due to chronic kidney disease 07/20/2021   CKD (chronic kidney disease) stage 5, GFR less than 15 ml/min (HCC) 05/24/2021   BPH (benign prostatic hyperplasia) 05/24/2021   Senile nuclear sclerosis 05/20/2021   Vitamin D deficiency 05/20/2021   Vitreous degeneration, unspecified eye 05/20/2021   Intrinsic eczema 04/18/2021   Atherosclerosis of aorta (HCC) 12/30/2020   Mixed conductive and sensorineural hearing loss of left ear with restricted hearing of right ear 09/01/2020   Metastatic renal cell carcinoma (HCC) 02/26/2020   Diabetic polyneuropathy associated with type 2 diabetes mellitus (HCC) 12/18/2019   Murmur, cardiac 10/08/2018   DDD (degenerative disc disease), cervical 10/08/2018   OAB (overactive bladder) 04/04/2018   Seasonal allergic rhinitis due to pollen 11/01/2016   Post traumatic stress disorder (PTSD) 01/29/2014   Spinal stenosis of lumbar region with radiculopathy 10/30/2011    Class:  Chronic   Obstructive sleep apnea 11/24/2010   Cognitive impairment 11/24/2010   ADENOCARCINOMA, PROSTATE, GLEASON GRADE 6 12/21/2009   Essential hypertension 03/16/2007   GERD 03/16/2007    ONSET DATE: 08/24/22   REFERRING DIAG:  THERAPY DIAG:  I63.9 (ICD-10-CM) - Acute ischemic stroke (HCC)  Rationale for Evaluation and Treatment: Rehabilitation  SUBJECTIVE:   SUBJECTIVE STATEMENT: "We were there all day" re: ED on Monday Pt accompanied by:  fiance  PERTINENT HISTORY: Pt is a 77 y.o. male who presents with episode of confusion/hallucinations. MRI (08/24/22) revealed "Acute left parieto-occipital periventricular infarcts in the region of prior infarct". SLE after previous CVA (04/09/22) noted cognitive impairment (memory/problem solving) with pt receiving OP SLP services post discharge. PMH: GERD, prior L PCA stroke, metastatic RCC, prostate cancer, stage  V CKD declining dialysis.   PAIN:  Are you having pain? No  FALLS: Has patient fallen in last 6 months?  See PT evaluation for details  LIVING ENVIRONMENT: Lives with: lives alone - fiance staying with him during the day until bedtime Lives in: House/apartment  PLOF:  Level of assistance: Independent with ADLs, Needed assistance with IADLS Employment: Retired  PATIENT GOALS: To manage AM meds, calendar, to get back to doing things" fiance  OBJECTIVE:   DIAGNOSTIC FINDINGS: Acute left parieto-occipital periventricular infarcts in the region of prior infarct.  COGNITION: Overall cognitive status: Impaired and Decline since CVA and course of ST in Feb 2024 Areas of impairment:  Attention: Impaired: Sustained, Selective, Alternating Memory: Impaired: Immediate Working Short term Engineering geologist Awareness: Impaired: Intellectual Executive function: Impaired: Initiation, Problem solving, Organization, Planning, Self-correction, and Slow processing Behavior: WNL Functional deficits: meds, bills, household  chores, humor  COGNITIVE COMMUNICATION: Following directions: Follows one step commands consistently  Auditory comprehension: Impaired: due to slow processing Verbal expression: Impaired: word finding difficulty noted in naming restaurants he frequents Functional communication: WFL  ORAL MOTOR EXAMINATION: Overall status: WFL Comments:   STANDARDIZED ASSESSMENTS: N/A - moderate impairments on CLQT prior eval   MOTOR SPEECH:  Volume: Averaged 54dB in conversation Low vocal intensity Voice and Resonance WNL Motor Planning WNL Intelligibility reduced at sentence level   TODAY'S TREATMENT:                                                                                                                                         DATE:   11/01/22: Pt enters room responding at 60dB - HEP for dysarthria with usual mod modeling, gestures and verbal cues Elmore averaged 80dB on loud Ah! Repeating conversational phrases with mod A he averaged 68dB. In structured task generating 3 sentence descriptions  of basic objects, Joan required frequent mod verbal cues, modeling and gestures to achieve 68dB (65 was average). Conversation re: items In between descriptions also required frequent mod to max A to achieve 65dB. We generated a schedule of getting out of bed at 9:00, going to kitchen, taking meds, eating a snack and looking at his calendar before going back to sleep to facilitated independence with am meds. Krystopher generated schedule of waking up again at 11:00 and getting showered and dressed then having lunch (see patient instructions) ongoing education re: benefits of a consistent sleep/wake  and daily schedule in building stamina and endurance post stroke.   10/27/22: Patient averaged 65 dB with occasional min-A reminder to speak louder and visual feedback by reading the audio reader. Vocal volume decreased during unstructured conversation, and patient was unable to carry loud voice over without  prompting. Calendar drafted of two household chores of vacuuming and washing clothes. Targeted voice through [employer] voice project to lead through structured and unstructured conversation.   10/24/22 (eval day): Initiated training in compensations for  memory, attention, awareness including reinstating calendar and to do list - need for cues initially for 2-3 weeks to get Leonette Most in the habit of using the calendar and list. Cognitive activities to do at home, use of schedule for am meds. Initiated training in HEP for dysarthria - Loud Ah averaged 80dB with frequent mod verbal cues, modeling. Oral reading conversational phrases averaged 68dB with usual mod modeling, verbal and gesture cues.   PATIENT EDUCATION: Education details: See Today's Treatment, Patient Instructions, compensations for cognition; HEP for dysarthria Person educated: Patient and Spouse Education method: Explanation, Demonstration, Verbal cues, and Handouts Education comprehension: verbal cues required and needs further education   GOALS: Goals reviewed with patient? Yes  SHORT TERM GOALS: Target date: 11/21/22  Pt will average 85dB on loud Ah with rare min A Baseline: Goal status: IN PROGRESS  2.  Pt will average 72dB on 18/20 10 word sentences with rare min A Baseline:  Goal status: IN PROGRESS  3.  Pt will use calendar and to do list to complete 2 household chores a day 5/7 days and recall appointments, social events with occasional min A from fiance Baseline:  Goal status: IN PROGRESS  4.  Pt will plan ahead 4 healthy meals for restaurants for 1 week using internet menus with occasional min A to target executive function and decision making Baseline:  Goal status: IN PROGRESS  5.  Pt will manage AM meds with external aids and occasional min A from fiance over 1 week Baseline:  Goal status: IN PROGRESS  6.  Pt will keep a schedule of getting out of bed upon waking for 2 hours before napping Baseline:  Goal  status: IN PROGRESS  LONG TERM GOALS: Target date: 01/16/23  Pt will manage am meds with none missed with rare min A from family over 1 week Baseline:  Goal status: IN PROGRESS  2.  Pt will use calendar and to do list to be ready for appointments and complete daily household tasks with rare min A from family  Baseline:  Goal status: IN PROGRESS  3.  Pt will average 70dB in structure speech tasks for 15 minutes with rare min A Baseline:  Goal status: IN PROGRESS  4.  Pt will average 70dB over 18 minute conversation with rare min A Baseline:  Goal status: IN PROGRESS  5.  Pt will require 2 or less requests for repetition when eating out in noisy environment Baseline:  Goal status: IN PROGRESS  6.  Pt will plan and attend 2 social events (men's breakfast, family event) using external aids to recall his ride and times Baseline:  Goal status: IN PROGRESS  ASSESSMENT:  CLINICAL IMPRESSION: Patient is a 77 y.o. male who was seen today for cognition and dysarthria. He is known to [place] from prior course of ST where he made good progress and was managing his meds, paying bills, managing his appointments and schedules. Since prior course, he has had a second CVA. His fiance [name] reports significant decline. Today he presents with moderate cognitive communication impairment and moderate hypokinetic dysarthria. Since 2nd CVA Carolan Clines is having to manage all medications and bills. She is having to stay at his house am through bed time due to cognitive impairments. She endorses his volume is soft and she frequently needs him to repeat. She notes that wait staff often ask him to repeat as well. At this time, Pear observes poor initiation. Prior to 2nd CVA Cianan was fastidious with cleaning and organizing his  home. He was doing his own laundry which she is now doing. He is not completing any chores. Jaccob wakes up, asks Pear his schedule and if he has nothing, he stays in bed and goes back to sleep until  noon or 1:00. Carolan Clines is concerned that he is not taking his am meds until 1:00 so she has to go over, wake him and give him his meds. Cloy verbalizes no awareness of his change In speech or his lack of initiation and poor sleep schedule. Leonette Most and [place] both endorse that he does not cook, but they eat out every day. This is concerning due to recurrent CVA.  After trials of exuberant voice therapy, he did note" I was in [employer] and I would have to talk louder than this" I recommend skilled ST to maximize intelligibility and cognition for safety, independence and to reduce caregiver burden.   OBJECTIVE IMPAIRMENTS: include attention, memory, awareness, executive functioning, expressive language, and dysarthria. These impairments are limiting patient from managing medications, managing appointments, managing finances, household responsibilities, ADLs/IADLs, and effectively communicating at home and in community. Factors affecting potential to achieve goals and functional outcome are previous level of function.. Patient will benefit from skilled SLP services to address above impairments and improve overall function.  REHAB POTENTIAL: Good  PLAN:  SLP FREQUENCY: 2x/week  SLP DURATION: 12 weeks  PLANNED INTERVENTIONS: Language facilitation, Cueing hierachy, Cognitive reorganization, Internal/external aids, Oral motor exercises, Functional tasks, Multimodal communication approach, and SLP instruction and feedback    Lakyra Tippins, Radene Journey, CCC-SLP 11/01/2022, 3:01 PM

## 2022-11-01 NOTE — Progress Notes (Unsigned)
[facility] OFFICE PROGRESS NOTE  Etta Grandchild, MD [address] 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 The patient was initially diagnosed with localized disease in 2017.   PRIOR THERAPY: 1) status post right partial nephrectomy with stage Ib 6.0 cm papillary tumor in 2017. 2) status post right adrenalectomy and metastatic resection of her recurrent kidney tumor and right peritoneum completed in October 2021 under the care of Dr. Marlou Porch and the final pathology confirmed the presence of papillary renal cell carcinoma. 3) the patient had evidence for recurrent disease in March 2022 with adrenal metastasis in addition to hepatic involvement and underwent percutaneous microwave ablation of the adrenal gland as well as the hepatic metastasis completed in March 2022 under the care of Dr. Archer Asa 4) status post treatment with nivolumab 480 Mg IV every 4 weeks started February 22, 2021 and discontinued November 2022 based on The patient's request.  CURRENT THERAPY: None   INTERVAL HISTORY: Antonio Adams 77 y.o. male returns to the clinic today for a follow-up visit accompanied by his girlfriend. The patient has a history of stage IV renal cell carcinoma.  He was previously followed by Dr. Clelia Croft who is since left the practice.  The patient had evidence of disease progression in December 2023 but the patient declined any intervention at that time.  The patient then return to the clinic on 10/19/2022 to establish care with Dr. Arbutus Ped.  Dr. Arbutus Ped had recommended restaging CT scans at that time.  If he has continued disease progression, patient is leaning towards not pursuing treatment.   In the interval since last being seen, the patient presented to the emergency room on 10/30/2022 for the chief complaint of weakness.  The patient was at the infusion center getting a Retacrit injection and he felt  weak and nauseated suddenly with clamminess. His symptoms ultimately resolved spontaneously and he was discharged home.   Otherwise, he denies any other change in his health.  He denies any fever, chills, or night sweats. He reports a good appetite.  He may have a mild cough "now and then".  He denies any chest pain or hemoptysis.  Denies any dyspnea on exertion.  He denies any headache or visual changes.  Denies any blood in the urine.  Denies any unusual bone pain.  Denies any abdominal pain.  He is here today for evaluation to review his scan results and for a more detailed discussion about his current condition and next steps.  MEDICAL HISTORY: Past Medical History:  Diagnosis Date   ADENOCARCINOMA, PROSTATE, GLEASON GRADE 6 12/21/2009   prostate cancer   ALLERGIC RHINITIS 03/16/2007   Allergy    Anxiety    Arthritis    back    BACK PAIN WITH RADICULOPATHY 11/23/2008   Cancer of kidney (HCC)    partial right kidney removed   CARPAL TUNNEL SYNDROME, BILATERAL 03/16/2007   Chronic back pain    Constipation    takes Senokot daily   Depression    occasionally   DIABETES MELLITUS, TYPE II 03/16/2007   takes Januvia and MEtformin daily   GASTROINTESTINAL HEMORRHAGE, HX OF 03/16/2007   GERD 03/16/2007   takes Omeprazole daily   Glaucoma    mild - no eye drops   Hemorrhoids    History of colon polyps    HYPERLIPIDEMIA 03/16/2007   takes Crestor daily   HYPERTENSION 03/16/2007   takes Diltiazem and Lisinopril daily  Kidney cancer, primary, with metastasis from kidney to other site [facility])    LEG CRAMPS 05/22/2007   Nocturia    Overactive bladder    PARESTHESIA 05/21/2007   Proctitis    PTSD (post-traumatic stress disorder)    wakes up may be dreaming of fighting   Rectal bleeding    Dr.Norins has explained its from the Radiation that he has received   Rectal bleeding    Renal cell carcinoma (HCC)    Sleep apnea    uses CPAP nightly   Stomach cancer (HCC)    Urinary frequency    takes  Toviaz daily    ALLERGIES:  is allergic to ace inhibitors, gabapentin, xyzal [levocetirizine], and opdivo [nivolumab].  MEDICATIONS:  Current Outpatient Medications  Medication Sig Dispense Refill   acetaminophen (TYLENOL) 500 MG tablet Take 1,000 mg by mouth every 6 (six) hours as needed for mild pain.     atorvastatin (LIPITOR) 80 MG tablet Take 1 tablet (80 mg total) by mouth daily. 90 tablet 1   calcitRIOL (ROCALTROL) 0.25 MCG capsule Take 0.25 mcg by mouth daily.     clopidogrel (PLAVIX) 75 MG tablet Take 1 tablet (75 mg total) by mouth daily. 90 tablet 3   Cyanocobalamin (B-12 PO) Take 1,000 mcg by mouth daily.     cyclobenzaprine (FLEXERIL) 5 MG tablet Take 1 tablet (5 mg total) by mouth 2 (two) times daily as needed for muscle spasms. 6 tablet 0   donepezil (ARICEPT) 10 MG tablet Take half tablet (5 mg) daily for 2 weeks, then one tablet daily. 30 tablet 11   DULoxetine (CYMBALTA) 20 MG capsule Take 1 capsule (20 mg total) by mouth daily. 30 capsule 5   ferrous sulfate 325 (65 FE) MG tablet Take 1 tablet (325 mg total) by mouth 2 (two) times daily. Patient needs office visit for further refills (Patient taking differently: Take 325 mg by mouth daily with breakfast.) 60 tablet 1   fluocinonide-emollient (LIDEX-E) 0.05 % cream Apply 1 Application topically 2 (two) times daily. 60 g 2   furosemide (LASIX) 40 MG tablet TAKE 1 TABLET BY MOUTH EVERY DAY 90 tablet 1   isosorbide mononitrate (IMDUR) 60 MG 24 hr tablet Take 1 tablet (60 mg total) by mouth daily. 30 tablet 2   metoprolol tartrate (LOPRESSOR) 25 MG tablet TAKE 1 TABLET BY MOUTH TWICE A DAY 180 tablet 3   mirtazapine (REMERON SOL-TAB) 15 MG disintegrating tablet Take 15 mg by mouth at bedtime.     pantoprazole (PROTONIX) 20 MG tablet Take 1 tablet (20 mg total) by mouth daily. (Patient taking differently: Take 20 mg by mouth daily as needed for heartburn or indigestion.) 30 tablet 0   polyvinyl alcohol (LIQUIFILM TEARS) 1.4 %  ophthalmic solution Place 1 drop into both eyes daily as needed for dry eyes.     sodium bicarbonate 650 MG tablet Take 2 tablets (1,300 mg total) by mouth 2 (two) times daily. 60 tablet 1   solifenacin (VESICARE) 5 MG tablet TAKE 1 TABLET (5 MG TOTAL) BY MOUTH DAILY. 90 tablet 1   tamsulosin (FLOMAX) 0.4 MG CAPS capsule TAKE 1 CAPSULE (0.4 MG TOTAL) BY MOUTH DAILY. (Patient taking differently: Take 0.4 mg by mouth daily.) 30 capsule 1   traMADol (ULTRAM) 50 MG tablet Take 1 tablet (50 mg total) by mouth every 12 (twelve) hours as needed. 30 tablet 2   zinc gluconate 50 MG tablet Take 1 tablet (50 mg total) by mouth daily. 90 tablet 1  No current facility-administered medications for this visit.    SURGICAL HISTORY:  Past Surgical History:  Procedure Laterality Date   ADRENALECTOMY Right 02/26/2020   Procedure: OPEN RIGHT RENAL CELL METASTATECTOMY AND  ADRENALECTOMY;  Surgeon: Crist Fat, MD;  Location: [facility];  Service: Urology;  Laterality: Right;   BACK SURGERY  1983, 2008   repeat surgery; ESI '08   CARPAL TUNNEL RELEASE     bilateral   CERVICAL FUSION     COLONOSCOPY     colonosocpy     ESOPHAGOGASTRODUODENOSCOPY     ED   FLEXIBLE SIGMOIDOSCOPY N/A 01/08/2017   Procedure: FLEXIBLE SIGMOIDOSCOPY;  Surgeon: Hilarie Fredrickson, MD;  Location: [facility] ENDOSCOPY;  Service: Endoscopy;  Laterality: N/A;   HOT HEMOSTASIS N/A 01/08/2017   Procedure: HOT HEMOSTASIS (ARGON PLASMA COAGULATION/BICAP);  Surgeon: Hilarie Fredrickson, MD;  Location: [facility] ENDOSCOPY;  Service: Endoscopy;  Laterality: N/A;   IR RADIOLOGIST EVAL & MGMT  12/17/2019   IR RADIOLOGIST EVAL & MGMT  07/01/2020   IR RADIOLOGIST EVAL & MGMT  08/25/2020   LUMBAR LAMINECTOMY  10/30/2011   Procedure: MICRODISCECTOMY LUMBAR LAMINECTOMY;  Surgeon: Kerrin Champagne, MD;  Location: [facility] OR;  Service: Orthopedics;  Laterality: Right;  Right L4-5 and L5-S1 Microdiscectomy   LUMBAR LAMINECTOMY  06/17/2012   Procedure: MICRODISCECTOMY LUMBAR LAMINECTOMY;   Surgeon: Kerrin Champagne, MD;  Location: [facility] OR;  Service: Orthopedics;  Laterality: N/A;  Right L5-S1 microdiscectomy   OPEN PARTIAL HEPATECTOMY  N/A 02/26/2020   Procedure: OPEN PARTIAL HEPATECTOMY, CHOLECYSTECTOMY;  Surgeon: Almond Lint, MD;  Location: [facility];  Service: General;  Laterality: N/A;   POLYPECTOMY     PROSTATE SURGERY  march 2012   seed implant   RADIOFREQUENCY ABLATION N/A 07/21/2020   Procedure: CT MICROWAVE ABLATION;  Surgeon: Sterling Big, MD;  Location: [facility];  Service: Anesthesiology;  Laterality: N/A;   ROBOTIC ASSITED PARTIAL NEPHRECTOMY Right 09/08/2015   Procedure: XI ROBOTIC ASSITED RIGHT PARTIAL NEPHRECTOMY;  Surgeon: Crist Fat, MD;  Location: [facility];  Service: Urology;  Laterality: Right;  Clamp on: 1033 Clamp off: 1103 Total Clamp Time: 30 minutes   SHOULDER ARTHROSCOPY WITH SUBACROMIAL DECOMPRESSION Left 01/20/2016   Procedure: LEFT SHOULDER ARTHROSCOPY WITH EXTENSIVE  DEBRIDEMENT, ACROMIOPLASTY;  Surgeon: Loreta Ave, MD;  Location: [facility];  Service: Orthopedics;  Laterality: Left;   SHOULDER SURGERY  LFT   Stress Cardiolite  12/24/2003   negative for ischemia   UPPER ESOPHAGEAL ENDOSCOPIC ULTRASOUND (EUS)  04/18/2016   [facility]   UPPER GASTROINTESTINAL ENDOSCOPY     WOUND EXPLORATION     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  HENT: Negative for mouth sores, nosebleeds, sore throat and trouble swallowing.   Eyes: Negative for eye problems and icterus.  Respiratory: Negative for cough, hemoptysis, shortness of breath and wheezing.   Cardiovascular: Negative for chest pain and leg swelling.  Gastrointestinal: Negative for abdominal pain, constipation, diarrhea, nausea and vomiting.  Genitourinary: Negative for bladder incontinence, difficulty urinating,  dysuria, frequency and hematuria.   Musculoskeletal: Negative for back pain, gait problem, neck pain and neck stiffness.  Skin: Negative for itching and rash.  Neurological: Negative for dizziness, extremity weakness, gait problem, headaches, light-headedness and seizures.  Hematological: Negative for adenopathy. Does not bruise/bleed easily.  Psychiatric/Behavioral: Negative for confusion,  depression and sleep disturbance. The patient is not nervous/anxious.     PHYSICAL EXAMINATION:  There were no vitals taken for this visit.  ECOG PERFORMANCE STATUS: 1  Physical Exam  Constitutional: Oriented to person, place, and time and well-developed, well-nourished, and in no distress. HENT:  Head: Normocephalic and atraumatic.  Mouth/Throat: Oropharynx is clear and moist. No oropharyngeal exudate.  Eyes: Conjunctivae are normal. Right eye exhibits no discharge. Left eye exhibits no discharge. No scleral icterus.  Neck: Normal range of motion. Neck supple.  Cardiovascular: Normal rate, regular rhythm, systolic murmur noted and intact distal pulses.   Pulmonary/Chest: Effort normal and breath sounds normal. No respiratory distress. No wheezes. No rales.  Abdominal: Soft. Bowel sounds are normal. Exhibits no distension and no mass. There is no tenderness.  Musculoskeletal: Normal range of motion. Exhibits no edema.  Lymphadenopathy:    No cervical adenopathy.  Neurological: Alert and oriented to person, place, and time. Exhibits normal muscle tone. Gait normal. Coordination normal.  Relates with a cane. Skin: Skin is warm and dry. No rash noted. Not diaphoretic. No erythema. No pallor.  Psychiatric: Mood, memory and judgment normal.  Vitals reviewed.  LABORATORY DATA: Lab Results  Component Value Date   WBC 3.0 (L) 10/30/2022   HGB 10.2 (L) 10/30/2022   HCT 30.0 (L) 10/30/2022   MCV 94.9 10/30/2022   PLT 172 10/30/2022      Chemistry      Component Value Date/Time   NA 139 10/30/2022  1110   NA 140 04/05/2022 0000   K 4.5 10/30/2022 1110   CL 109 10/30/2022 1110   CO2 16 (L) 10/30/2022 1050   BUN 57 (H) 10/30/2022 1110   BUN 42 (A) 04/05/2022 0000   CREATININE 10.80 (H) 10/30/2022 1110   CREATININE 7.28 (HH) 04/14/2022 1501   CREATININE 2.11 (H) 12/18/2019 1425   GLU 97 04/05/2022 0000      Component Value Date/Time   CALCIUM 9.3 10/30/2022 1050   ALKPHOS 87 10/30/2022 1050   AST 24 10/30/2022 1050   AST 19 04/14/2022 1501   ALT 31 10/30/2022 1050   ALT 13 04/14/2022 1501   BILITOT 0.5 10/30/2022 1050   BILITOT 0.3 04/14/2022 1501       RADIOGRAPHIC STUDIES:  DG Chest Port 1 View  Result Date: 10/30/2022 CLINICAL DATA:  sob EXAM: PORTABLE CHEST 1 VIEW COMPARISON:  Chest x-ray April 11, 24. FINDINGS: Limited study with mild streaky left basilar opacities. No confluent consolidation. Enlarged cardiac silhouette. Pulmonary vascular congestion. Suspected small bilateral pleural effusions. No visible pneumothorax. ACDF, partially imaged. IMPRESSION: 1. Cardiomegaly and pulmonary vascular congestion. No overt pulmonary edema. 2. Suspected small bilateral pleural effusions. 3. Limited study with mild streaky left basilar opacities, favor atelectasis. Electronically Signed   By: Feliberto Harts M.D.   On: 10/30/2022 12:45     ASSESSMENT/PLAN:  This is a very pleasant 77 year old African-American male diagnosed with stage IV papillary renal cell carcinoma.  If adenopathy was documented in 2021.  He was initially diagnosed with localized disease in 2017.  He is status post the following treatments:  1) 1) status post right partial nephrectomy with stage Ib 6.0 cm papillary tumor in 2017. 2) status post right adrenalectomy and metastatic resection of her recurrent kidney tumor and right peritoneum completed in October 2021 under the care of Dr. Marlou Porch and the final pathology confirmed the presence of papillary renal cell carcinoma. 3) the patient had evidence for  recurrent disease in March 2022 with  adrenal metastasis in addition to hepatic involvement and underwent percutaneous microwave ablation of the adrenal gland as well as the hepatic metastasis completed in March 2022 under the care of Dr. Archer Asa 4) status post treatment with nivolumab 480 Mg IV every 4 weeks started February 22, 2021 and discontinued November 2022 based on the patient's request in addition to significant cramps in his hands.  He has been on observation since that time.  He had repeat imaging studies in December 2023 that showed disease progression but the patient had not had any treatment.  The patient establish care with Dr. Arbutus Ped in June 2024.  Dr. Arbutus Ped recommended he have repeat imaging studies.  The patient was seen by Dr. Arbutus Ped today.  Dr. Arbutus Ped personally independently reviewed his scan and discussed results with the patient today.  The scan showed these progression in the liver and enlarging retroperitoneal nodal disease.   Dr. Arbutus Ped discussed resuming treatment with nivolumab versus palliative care and hospice.  Will be difficult to treat the patient with aggressive measures given his advanced renal disease/failure.  The patient is leaning towards not pursuing any treatment.  In which case Dr. Arbutus Ped would expect prognosis to be 6 months or so.  Patient has opted to not pursue any treatment at this time.  He is also not interested in palliative care or hospice.  We did discuss with the patient that should he change his mind, it is never a bad idea to get on board with palliative care and hospice to come out for consultation so the patient may know what this is they offer if needed.  The patient will think about this and will call me if he changes his mind.  He is currently taking tramadol for occasional right upper quadrant pain.  We will not arrange for any follow-up appointments at this time but we be happy to see the patient on an as-needed basis.  The patient  was advised to call immediately if he has any concerning symptoms in the interval. The patient voices understanding of current disease status and treatment options and is in agreement with the current care plan. All questions were answered. The patient knows to call the clinic with any problems, questions or concerns. We can certainly see the patient much sooner if necessary  No orders of the defined types were placed in this encounter.   Kion Huntsberry L Devri Kreher, PA-C 11/01/22  ADDENDUM: Hematology/Oncology Attending: I had a face-to-face encounter with the patient today.  I reviewed his record, lab, scan and recommended his care plan.  This is a very pleasant 77 years old African-American male with history of a stage IV papillary renal cell carcinoma diagnosed initially as localized disease in 2017 with evidence of metastasis in 2021.  The patient status post several treatment in the past including right partial nephrectomy in 2017, status post right adrenalectomy with metastatic resection of the recurrent disease in October 2021 he was also found to have evidence for recurrent and metastatic disease with adrenal metastasis in addition to hepatic metastasis status post percutaneous microwave ablation in March 2022.  The patient was also treated with nivolumab for 2 cycles discontinued in November 2022 based on The patient's request.  He has been on observation since that time by Dr. Clelia Croft and imaging studies in December 2023 showed evidence for disease progression but the patient was reluctant to consider treatment at that time.  He was followed by observation and he had repeat CT scan of the chest, abdomen and  pelvis performed recently.  I personally and independently reviewed the scan and discussed the result with the patient and his wife. Unfortunately his scan showed significant interval enlargement of hepatic metastasis in addition to enlarging retroperitoneal nodal disease.  I had a lengthy  discussion with the patient today about his current condition and treatment options.  The patient had end-stage renal disease with creatinine of around 10.0. I gave the patient the option of palliative care and hospice referral versus consideration of treatment with immunotherapy with nivolumab 480 Mg IV every 4 weeks which is still risky with his current end-stage renal disease. After discussion of the options the patient decided not to proceed with any treatment at this point but he also declined to call for hospice service at this point. We will see him on as-needed basis at this point but he knows to call us if he would like to consult with hospice care. He was advised to call immediately if he has any other concerning symptoms in the interval. The total time spent in the appointment was 30 minutes. Disclaimer: This note was dictated with voice recognition software. Similar sounding words can inadvertently be transcribed and may be missed upon review. Lajuana Matte, MD

## 2022-11-01 NOTE — Patient Instructions (Addendum)
  9:00 am - wake up                  Go to kitchen                  Take meds                  Have a snack and drink                  Look at calendar  9:30 - nap  11:00 - wake up             Shower             Dress  Lunch  Chores/cleaning after lunch  Have your sons encourage you to use good volume

## 2022-11-02 ENCOUNTER — Ambulatory Visit: Payer: Medicare Other | Admitting: Physical Therapy

## 2022-11-02 ENCOUNTER — Inpatient Hospital Stay: Payer: Medicare Other | Admitting: Physician Assistant

## 2022-11-02 VITALS — BP 168/98 | HR 75 | Temp 97.9°F | Resp 17 | Wt 199.5 lb

## 2022-11-02 DIAGNOSIS — Z7189 Other specified counseling: Secondary | ICD-10-CM | POA: Diagnosis not present

## 2022-11-02 DIAGNOSIS — Z85528 Personal history of other malignant neoplasm of kidney: Secondary | ICD-10-CM | POA: Diagnosis not present

## 2022-11-02 DIAGNOSIS — E1122 Type 2 diabetes mellitus with diabetic chronic kidney disease: Secondary | ICD-10-CM | POA: Diagnosis not present

## 2022-11-02 DIAGNOSIS — Z9221 Personal history of antineoplastic chemotherapy: Secondary | ICD-10-CM | POA: Diagnosis not present

## 2022-11-02 DIAGNOSIS — Z7902 Long term (current) use of antithrombotics/antiplatelets: Secondary | ICD-10-CM | POA: Diagnosis not present

## 2022-11-02 DIAGNOSIS — Z905 Acquired absence of kidney: Secondary | ICD-10-CM | POA: Diagnosis not present

## 2022-11-02 DIAGNOSIS — C641 Malignant neoplasm of right kidney, except renal pelvis: Secondary | ICD-10-CM

## 2022-11-02 DIAGNOSIS — Z79899 Other long term (current) drug therapy: Secondary | ICD-10-CM | POA: Diagnosis not present

## 2022-11-02 DIAGNOSIS — N186 End stage renal disease: Secondary | ICD-10-CM | POA: Diagnosis not present

## 2022-11-02 DIAGNOSIS — C787 Secondary malignant neoplasm of liver and intrahepatic bile duct: Secondary | ICD-10-CM | POA: Diagnosis not present

## 2022-11-02 DIAGNOSIS — Z515 Encounter for palliative care: Secondary | ICD-10-CM | POA: Insufficient documentation

## 2022-11-02 DIAGNOSIS — Z923 Personal history of irradiation: Secondary | ICD-10-CM | POA: Diagnosis not present

## 2022-11-02 DIAGNOSIS — I1311 Hypertensive heart and chronic kidney disease without heart failure, with stage 5 chronic kidney disease, or end stage renal disease: Secondary | ICD-10-CM | POA: Diagnosis not present

## 2022-11-02 DIAGNOSIS — C772 Secondary and unspecified malignant neoplasm of intra-abdominal lymph nodes: Secondary | ICD-10-CM | POA: Diagnosis not present

## 2022-11-06 ENCOUNTER — Telehealth: Payer: Self-pay

## 2022-11-06 NOTE — Telephone Encounter (Signed)
Transition Care Management Unsuccessful Follow-up Telephone Call  Date of discharge and from where:  [facility]  6/17  Attempts:  1st Attempt  Reason for unsuccessful TCM follow-up call:    Unable to leave a message    Lenard Forth [facility] Guide, [facility] [phone] [address] [place], [place], [place] [address] Phone: [phone] Email: [email or web address]

## 2022-11-07 ENCOUNTER — Ambulatory Visit: Payer: Medicare Other | Admitting: Physical Therapy

## 2022-11-07 ENCOUNTER — Ambulatory Visit: Payer: Medicare Other | Admitting: Speech Pathology

## 2022-11-07 ENCOUNTER — Telehealth: Payer: Self-pay

## 2022-11-07 DIAGNOSIS — R2689 Other abnormalities of gait and mobility: Secondary | ICD-10-CM | POA: Diagnosis not present

## 2022-11-07 DIAGNOSIS — R471 Dysarthria and anarthria: Secondary | ICD-10-CM

## 2022-11-07 DIAGNOSIS — R2681 Unsteadiness on feet: Secondary | ICD-10-CM | POA: Diagnosis not present

## 2022-11-07 DIAGNOSIS — M6281 Muscle weakness (generalized): Secondary | ICD-10-CM | POA: Diagnosis not present

## 2022-11-07 NOTE — Telephone Encounter (Signed)
Transition Care Management Unsuccessful Follow-up Telephone Call  Date of discharge and from where:  [facility] 6/17  Attempts:  2nd Attempt  Reason for unsuccessful TCM follow-up call:  Left voice message   Antonio [facility] Guide, [place] [phone] [address], [place], [place] [address] Phone: [phone] Email: [email or web address]       

## 2022-11-07 NOTE — Therapy (Signed)
OUTPATIENT SPEECH LANGUAGE PATHOLOGY TREATMENT   Patient Name: Antonio Adams MRN: [id number] DOB:August 12, 1945, 77 y.o., male Today's Date: 11/07/2022  PCP: Etta Grandchild, MD REFERRING PROVIDER: Etta Grandchild, MD  END OF SESSION:  End of Session - 11/07/22 0942     Visit Number 4    Number of Visits 25    Date for SLP Re-Evaluation 01/16/23    Authorization Type [employer] Medicare    SLP Start Time [phone]    SLP Stop Time  1027    SLP Time Calculation (min) 45 min    Activity Tolerance Patient tolerated treatment well               Past Medical History:  Diagnosis Date   ADENOCARCINOMA, PROSTATE, GLEASON GRADE 6 12/21/2009   prostate cancer   ALLERGIC RHINITIS 03/16/2007   Allergy    Anxiety    Arthritis    back    BACK PAIN WITH RADICULOPATHY 11/23/2008   Cancer of kidney (HCC)    partial right kidney removed   CARPAL TUNNEL SYNDROME, BILATERAL 03/16/2007   Chronic back pain    Constipation    takes Senokot daily   Depression    occasionally   DIABETES MELLITUS, TYPE II 03/16/2007   takes Januvia and MEtformin daily   GASTROINTESTINAL HEMORRHAGE, HX OF 03/16/2007   GERD 03/16/2007   takes Omeprazole daily   Glaucoma    mild - no eye drops   Hemorrhoids    History of colon polyps    HYPERLIPIDEMIA 03/16/2007   takes Crestor daily   HYPERTENSION 03/16/2007   takes Diltiazem and Lisinopril daily    Kidney cancer, primary, with metastasis from kidney to other site [facility])    LEG CRAMPS 05/22/2007   Nocturia    Overactive bladder    PARESTHESIA 05/21/2007   Proctitis    PTSD (post-traumatic stress disorder)    wakes up may be dreaming of fighting   Rectal bleeding    Dr.Norins has explained its from the Radiation that he has received   Rectal bleeding    Renal cell carcinoma (HCC)    Sleep apnea    uses CPAP nightly   Stomach cancer (HCC)    Urinary frequency    takes Toviaz daily   Past Surgical History:  Procedure Laterality Date   ADRENALECTOMY Right  02/26/2020   Procedure: OPEN RIGHT RENAL CELL METASTATECTOMY AND  ADRENALECTOMY;  Surgeon: Crist Fat, MD;  Location: [facility];  Service: Urology;  Laterality: Right;   BACK SURGERY  1983, 2008   repeat surgery; ESI '08   CARPAL TUNNEL RELEASE     bilateral   CERVICAL FUSION     COLONOSCOPY     colonosocpy     ESOPHAGOGASTRODUODENOSCOPY     ED   FLEXIBLE SIGMOIDOSCOPY N/A 01/08/2017   Procedure: FLEXIBLE SIGMOIDOSCOPY;  Surgeon: Hilarie Fredrickson, MD;  Location: [facility] ENDOSCOPY;  Service: Endoscopy;  Laterality: N/A;   HOT HEMOSTASIS N/A 01/08/2017   Procedure: HOT HEMOSTASIS (ARGON PLASMA COAGULATION/BICAP);  Surgeon: Hilarie Fredrickson, MD;  Location: [facility] ENDOSCOPY;  Service: Endoscopy;  Laterality: N/A;   IR RADIOLOGIST EVAL & MGMT  12/17/2019   IR RADIOLOGIST EVAL & MGMT  07/01/2020   IR RADIOLOGIST EVAL & MGMT  08/25/2020   LUMBAR LAMINECTOMY  10/30/2011   Procedure: MICRODISCECTOMY LUMBAR LAMINECTOMY;  Surgeon: Kerrin Champagne, MD;  Location: [facility] OR;  Service: Orthopedics;  Laterality: Right;  Right L4-5 and L5-S1 Microdiscectomy   LUMBAR LAMINECTOMY  06/17/2012   Procedure: MICRODISCECTOMY LUMBAR LAMINECTOMY;  Surgeon: Kerrin Champagne, MD;  Location: [facility] OR;  Service: Orthopedics;  Laterality: N/A;  Right L5-S1 microdiscectomy   OPEN PARTIAL HEPATECTOMY  N/A 02/26/2020   Procedure: OPEN PARTIAL HEPATECTOMY, CHOLECYSTECTOMY;  Surgeon: Almond Lint, MD;  Location: [facility];  Service: General;  Laterality: N/A;   POLYPECTOMY     PROSTATE SURGERY  march 2012   seed implant   RADIOFREQUENCY ABLATION N/A 07/21/2020   Procedure: CT MICROWAVE ABLATION;  Surgeon: Sterling Big, MD;  Location: [facility];  Service: Anesthesiology;  Laterality: N/A;   ROBOTIC ASSITED PARTIAL NEPHRECTOMY Right 09/08/2015   Procedure: XI ROBOTIC ASSITED RIGHT PARTIAL NEPHRECTOMY;  Surgeon: Crist Fat, MD;  Location: [facility];  Service: Urology;  Laterality: Right;  Clamp on: 1033 Clamp off: 1103 Total Clamp Time: 30 minutes    SHOULDER ARTHROSCOPY WITH SUBACROMIAL DECOMPRESSION Left 01/20/2016   Procedure: LEFT SHOULDER ARTHROSCOPY WITH EXTENSIVE  DEBRIDEMENT, ACROMIOPLASTY;  Surgeon: Loreta Ave, MD;  Location: [facility];  Service: Orthopedics;  Laterality: Left;   SHOULDER SURGERY  LFT   Stress Cardiolite  12/24/2003   negative for ischemia   UPPER ESOPHAGEAL ENDOSCOPIC ULTRASOUND (EUS)  04/18/2016   [facility]   UPPER GASTROINTESTINAL ENDOSCOPY     WOUND EXPLORATION     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   Patient Active Problem List   Diagnosis Date Noted   Goals of care, counseling/discussion 11/02/2022   Traumatic coccydynia 08/30/2022   Acute ischemic stroke (HCC) 08/24/2022   Encounter for general adult medical examination with abnormal findings 01/23/2022   Anemia due to zinc deficiency 12/20/2021   Symptomatic anemia 12/15/2021   Cancer related pain 12/15/2021   Chronic heart failure with preserved ejection fraction (HFpEF) (HCC) 08/12/2021   Tinea cruris 08/12/2021   Anemia due to chronic kidney disease 07/20/2021   CKD (chronic kidney disease) stage 5, GFR less than 15 ml/min (HCC) 05/24/2021   BPH (benign prostatic hyperplasia) 05/24/2021   Senile nuclear sclerosis 05/20/2021   Vitamin D deficiency 05/20/2021   Vitreous degeneration, unspecified eye 05/20/2021   Intrinsic eczema 04/18/2021   Atherosclerosis of aorta (HCC) 12/30/2020   Mixed conductive and sensorineural hearing loss of left ear with restricted hearing of right ear 09/01/2020   Metastatic renal cell carcinoma (HCC) 02/26/2020   Diabetic polyneuropathy associated with type 2 diabetes mellitus (HCC) 12/18/2019   Murmur, cardiac 10/08/2018   DDD (degenerative disc disease), cervical 10/08/2018   OAB (overactive bladder) 04/04/2018   Seasonal allergic rhinitis due to pollen 11/01/2016   Post traumatic stress disorder (PTSD) 01/29/2014   Spinal stenosis of lumbar region with  radiculopathy 10/30/2011    Class: Chronic   Obstructive sleep apnea 11/24/2010   Cognitive impairment 11/24/2010   ADENOCARCINOMA, PROSTATE, GLEASON GRADE 6 12/21/2009   Essential hypertension 03/16/2007   GERD 03/16/2007    ONSET DATE: 08/24/22   REFERRING DIAG:  THERAPY DIAG:  I63.9 (ICD-10-CM) - Acute ischemic stroke (HCC)  Rationale for Evaluation and Treatment: Rehabilitation  SUBJECTIVE:   SUBJECTIVE STATEMENT: "So so, people dying in the family."  Pt accompanied by:  fiance  PERTINENT HISTORY: Pt is a 77 y.o. male who presents with episode of confusion/hallucinations. MRI (08/24/22) revealed "Acute left parieto-occipital periventricular infarcts in the region of prior infarct". SLE after previous CVA (04/09/22) noted cognitive impairment (memory/problem solving) with pt receiving OP SLP services post discharge. PMH: GERD, prior L PCA stroke, metastatic RCC,  prostate cancer, stage V CKD declining dialysis.   PAIN:  Are you having pain? No  FALLS: Has patient fallen in last 6 months?  See PT evaluation for details  LIVING ENVIRONMENT: Lives with: lives alone - fiance staying with him during the day until bedtime Lives in: House/apartment  PLOF:  Level of assistance: Independent with ADLs, Needed assistance with IADLS Employment: Retired  PATIENT GOALS: To manage AM meds, calendar, to get back to doing things" fiance  OBJECTIVE:   DIAGNOSTIC FINDINGS: Acute left parieto-occipital periventricular infarcts in the region of prior infarct.  COGNITION: Overall cognitive status: Impaired and Decline since CVA and course of ST in Feb 2024 Areas of impairment:  Attention: Impaired: Sustained, Selective, Alternating Memory: Impaired: Immediate Working Short term Engineering geologist Awareness: Impaired: Intellectual Executive function: Impaired: Initiation, Problem solving, Organization, Planning, Self-correction, and Slow processing Behavior: WNL Functional  deficits: meds, bills, household chores, humor  COGNITIVE COMMUNICATION: Following directions: Follows one step commands consistently  Auditory comprehension: Impaired: due to slow processing Verbal expression: Impaired: word finding difficulty noted in naming restaurants he frequents Functional communication: WFL  ORAL MOTOR EXAMINATION: Overall status: WFL Comments:   STANDARDIZED ASSESSMENTS: N/A - moderate impairments on CLQT prior eval   MOTOR SPEECH:  Volume: Averaged 54dB in conversation Low vocal intensity Voice and Resonance WNL Motor Planning WNL Intelligibility reduced at sentence level   TODAY'S TREATMENT:                                                                                                                                         DATE:   11/07/22: Pt states that schedule and medication is making slow progress. There was a death in the family, and pt demonstrated a low, unmotivated mood due to hard news in the family. Pt's voice was at a low volume today during unstructured conversation and was able to increase when he was passionate about the topic. Pt benefited from mod-A models, gestural cues, and prompts to increase volume. Patient said that he will increase HEP in the coming weeks. Reviewed previously generated schedule and encouraged pt to begin tomorrow, as he has not yet.   11/01/22: Pt enters room responding at 60dB - HEP for dysarthria with usual mod modeling, gestures and verbal cues Hanzel averaged 80dB on loud Ah! Repeating conversational phrases with mod A he averaged 68dB. In structured task generating 3 sentence descriptions  of basic objects, Riel required frequent mod verbal cues, modeling and gestures to achieve 68dB (65 was average). Conversation re: items In between descriptions also required frequent mod to max A to achieve 65dB. We generated a schedule of getting out of bed at 9:00, going to kitchen, taking meds, eating a snack and looking at  his calendar before going back to sleep to facilitated independence with am meds. Aniello generated schedule of waking up again at 11:00 and getting showered and dressed  then having lunch (see patient instructions) ongoing education re: benefits of a consistent sleep/wake  and daily schedule in building stamina and endurance post stroke.   10/27/22: Patient averaged 65 dB with occasional min-A reminder to speak louder and visual feedback by reading the audio reader. Vocal volume decreased during unstructured conversation, and patient was unable to carry loud voice over without prompting. Calendar drafted of two household chores of vacuuming and washing clothes. Targeted voice through parkinson's voice project to lead through structured and unstructured conversation.   10/24/22 (eval day): Initiated training in compensations for memory, attention, awareness including reinstating calendar and to do list - need for cues initially for 2-3 weeks to get Abhishek in the habit of using the calendar and list. Cognitive activities to do at home, use of schedule for am meds. Initiated training in HEP for dysarthria - Loud Ah averaged 80dB with frequent mod verbal cues, modeling. Oral reading conversational phrases averaged 68dB with usual mod modeling, verbal and gesture cues.   PATIENT EDUCATION: Education details: See Today's Treatment, Patient Instructions, compensations for cognition; HEP for dysarthria Person educated: Patient and Spouse Education method: Explanation, Demonstration, Verbal cues, and Handouts Education comprehension: verbal cues required and needs further education   GOALS: Goals reviewed with patient? Yes  SHORT TERM GOALS: Target date: 11/21/22  Pt will average 85dB on loud Ah with rare min A Baseline: Goal status: IN PROGRESS  2.  Pt will average 72dB on 18/20 10 word sentences with rare min A Baseline:  Goal status: IN PROGRESS  3.  Pt will use calendar and to do list to complete  2 household chores a day 5/7 days and recall appointments, social events with occasional min A from fiance Baseline:  Goal status: IN PROGRESS  4.  Pt will plan ahead 4 healthy meals for restaurants for 1 week using internet menus with occasional min A to target executive function and decision making Baseline:  Goal status: IN PROGRESS  5.  Pt will manage AM meds with external aids and occasional min A from fiance over 1 week Baseline:  Goal status: IN PROGRESS  6.  Pt will keep a schedule of getting out of bed upon waking for 2 hours before napping Baseline:  Goal status: IN PROGRESS  LONG TERM GOALS: Target date: 01/16/23  Pt will manage am meds with none missed with rare min A from family over 1 week Baseline:  Goal status: IN PROGRESS  2.  Pt will use calendar and to do list to be ready for appointments and complete daily household tasks with rare min A from family  Baseline:  Goal status: IN PROGRESS  3.  Pt will average 70dB in structure speech tasks for 15 minutes with rare min A Baseline:  Goal status: IN PROGRESS  4.  Pt will average 70dB over 18 minute conversation with rare min A Baseline:  Goal status: IN PROGRESS  5.  Pt will require 2 or less requests for repetition when eating out in noisy environment Baseline:  Goal status: IN PROGRESS  6.  Pt will plan and attend 2 social events (men's breakfast, family event) using external aids to recall his ride and times Baseline:  Goal status: IN PROGRESS  ASSESSMENT:  CLINICAL IMPRESSION: Patient is a 77 y.o. male who was seen today for cognition and dysarthria. He is known to [place] from prior course of ST where he made good progress and was managing his meds, paying bills, managing his appointments and schedules. Since  prior course, he has had a second CVA. His fiance [name] reports significant decline. Today he presents with moderate cognitive communication impairment and moderate hypokinetic dysarthria. Since 2nd CVA  Carolan Clines is having to manage all medications and bills. She is having to stay at his house am through bed time due to cognitive impairments. She endorses his volume is soft and she frequently needs him to repeat. She notes that wait staff often ask him to repeat as well. At this time, Pear observes poor initiation. Prior to 2nd CVA Bufford was fastidious with cleaning and organizing his home. He was doing his own laundry which she is now doing. He is not completing any chores. Ace wakes up, asks Pear his schedule and if he has nothing, he stays in bed and goes back to sleep until noon or 1:00. Carolan Clines is concerned that he is not taking his am meds until 1:00 so she has to go over, wake him and give him his meds. Dariel verbalizes no awareness of his change In speech or his lack of initiation and poor sleep schedule. Leonette Most and Jefferson both endorse that he does not cook, but they eat out every day. This is concerning due to recurrent CVA.  After trials of exuberant voice therapy, he did note" I was in the [employer] and I would have to talk louder than this" I recommend skilled ST to maximize intelligibility and cognition for safety, independence and to reduce caregiver burden.   OBJECTIVE IMPAIRMENTS: include attention, memory, awareness, executive functioning, expressive language, and dysarthria. These impairments are limiting patient from managing medications, managing appointments, managing finances, household responsibilities, ADLs/IADLs, and effectively communicating at home and in community. Factors affecting potential to achieve goals and functional outcome are previous level of function.. Patient will benefit from skilled SLP services to address above impairments and improve overall function.  REHAB POTENTIAL: Good  PLAN:  SLP FREQUENCY: 2x/week  SLP DURATION: 12 weeks  PLANNED INTERVENTIONS: Language facilitation, Cueing hierachy, Cognitive reorganization, Internal/external aids, Oral motor  exercises, Functional tasks, Multimodal communication approach, and SLP instruction and feedback    [place], Student-SLP 11/07/2022, 9:43 AM

## 2022-11-08 ENCOUNTER — Encounter: Payer: Self-pay | Admitting: Internal Medicine

## 2022-11-08 ENCOUNTER — Ambulatory Visit (INDEPENDENT_AMBULATORY_CARE_PROVIDER_SITE_OTHER): Payer: Medicare Other | Admitting: Internal Medicine

## 2022-11-08 VITALS — BP 128/76 | HR 69 | Temp 98.3°F | Ht 75.0 in | Wt 194.0 lb

## 2022-11-08 DIAGNOSIS — Z515 Encounter for palliative care: Secondary | ICD-10-CM

## 2022-11-08 DIAGNOSIS — M5030 Other cervical disc degeneration, unspecified cervical region: Secondary | ICD-10-CM

## 2022-11-08 DIAGNOSIS — M5416 Radiculopathy, lumbar region: Secondary | ICD-10-CM

## 2022-11-08 DIAGNOSIS — M48061 Spinal stenosis, lumbar region without neurogenic claudication: Secondary | ICD-10-CM

## 2022-11-08 DIAGNOSIS — C641 Malignant neoplasm of right kidney, except renal pelvis: Secondary | ICD-10-CM

## 2022-11-08 DIAGNOSIS — I700 Atherosclerosis of aorta: Secondary | ICD-10-CM

## 2022-11-08 DIAGNOSIS — G893 Neoplasm related pain (acute) (chronic): Secondary | ICD-10-CM | POA: Diagnosis not present

## 2022-11-08 MED ORDER — XTAMPZA ER 9 MG PO C12A
1.0000 | EXTENDED_RELEASE_CAPSULE | Freq: Two times a day (BID) | ORAL | 0 refills | Status: DC | PRN
Start: 2022-11-08 — End: 2023-06-14

## 2022-11-08 NOTE — Patient Instructions (Signed)
Chronic Pain, Adult Chronic pain is a type of pain that lasts or keeps coming back for at least 3-6 months. You may have headaches, pain in the abdomen, or pain in other areas of the body. Chronic pain may be related to an illness, injury, or a health condition. Sometimes, the cause of chronic pain is not known. Chronic pain can make it hard for you to do daily activities. If it is not treated, chronic pain can lead to anxiety and depression. Treatment depends on the cause of your pain and how severe it is. You may need to work with a pain specialist to come up with a treatment plan. Many people benefit from two or more types of treatment to control their pain. Follow these instructions at home: Treatment plan Follow your treatment plan as told by your health care provider. This may include: Gentle, regular exercise. Eating a healthy diet that includes foods such as vegetables, fruits, fish, and lean meats. Mental health therapy (cognitive or behavioral therapy) that changes the way you think or act in response to the pain. This may help improve how you feel. Doing physical therapy exercises to improve movement and strength. Meditation, yoga, acupuncture, or massage therapy. Using the oils from plants in your environment or on your skin (aromatherapy). Other treatments may include: Over-the-counter or prescription medicines. Color, light, or sound therapy. Local electrical stimulation. The electrical pulses help to relieve pain by temporarily stopping the nerve impulses that cause you to feel pain. Injections. These deliver numbing or pain-relieving medicines into the spine or the area of pain.  Medicines Take over-the-counter and prescription medicines only as told by your health care provider. Ask your health care provider if the medicine prescribed to you: Requires you to avoid driving or using machinery. Can cause constipation. You may need to take these actions to prevent or treat  constipation: Drink enough fluid to keep your urine pale yellow. Take over-the-counter or prescription medicines. Eat foods that are high in fiber, such as beans, whole grains, and fresh fruits and vegetables. Limit foods that are high in fat and processed sugars, such as fried or sweet foods. Lifestyle  Ask your health care provider whether you should keep a pain diary. Your health care provider will tell you what information to write in the diary. This may include: When you have pain. What the pain feels like. How medicines and other behaviors or treatments help to reduce the pain. Consider talking with a mental health care provider about how to help manage chronic pain. Consider joining a chronic pain support group. Try to control or lower your stress levels. Talk with your health care provider about ways to do this. General instructions Learn as much as you can about how to manage your chronic pain. Ask your health care provider if an intensive pain rehabilitation program or a chronic pain specialist would be helpful. Check your pain level as told by your health care provider. Ask your health care provider if you should use a pain scale. Contact a health care provider if: Your pain is not controlled with treatment. You have new pain. You have side effects from pain medicine. You feel weak or you have trouble doing your normal activities. You have trouble sleeping or you develop confusion. You lose feeling or have numbness in your body. You lose control of your bowels or bladder. Get help right away if: Your pain suddenly gets much worse. You develop chest pain. You have trouble breathing or shortness of   breath. You faint, or another person sees you faint. These symptoms may be an emergency. Get help right away. Call 911. Do not wait to see if the symptoms will go away. Do not drive yourself to the hospital. Also, get help right away if: You have thoughts about hurting yourself  or others. Take one of these steps if you feel like you may hurt yourself or others, or have thoughts about taking your own life: Go to your nearest emergency room. Call 911. Call the National Suicide Prevention Lifeline at 1-800-273-8255 or 988. This is open 24 hours a day. Text the Crisis Text Line at 741741. This information is not intended to replace advice given to you by your health care provider. Make sure you discuss any questions you have with your health care provider. Document Revised: 12/21/2021 Document Reviewed: 11/23/2021 Elsevier Patient Education  2024 [employer]  

## 2022-11-08 NOTE — Progress Notes (Signed)
Subjective:  Patient ID: Antonio Adams, male    DOB: 1945/06/15  Age: 77 y.o. MRN: [id number]  CC: Anemia   HPI Antonio Adams presents for f/up ----  Discussed the use of AI scribe software for clinical note transcription with the patient, who gave verbal consent to proceed.  History of Present Illness   The patient, who has a history of receiving regular injections for cancer, presented to the emergency department after becoming unresponsive and weak post-injection. He was administered half a bag of fluid, which led to an improvement in his condition. A CT scan and blood work were performed, with the initial impression suggesting that the episode was not related to the patient's kidneys or the injection. The patient denies any lingering symptoms such as dizziness, lightheadedness, chest pain, shortness of breath, swelling, abdominal pain, nausea, or vomiting. However, he reports persistent pain on his side, which he attributes to a previous surgery. The pain interferes with his sleep and daily activities.       Outpatient Medications Prior to Visit  Medication Sig Dispense Refill   acetaminophen (TYLENOL) 500 MG tablet Take 1,000 mg by mouth every 6 (six) hours as needed for mild pain.     atorvastatin (LIPITOR) 80 MG tablet Take 1 tablet (80 mg total) by mouth daily. 90 tablet 1   calcitRIOL (ROCALTROL) 0.25 MCG capsule Take 0.25 mcg by mouth daily.     clopidogrel (PLAVIX) 75 MG tablet Take 1 tablet (75 mg total) by mouth daily. 90 tablet 3   Cyanocobalamin (B-12 PO) Take 1,000 mcg by mouth daily.     cyclobenzaprine (FLEXERIL) 5 MG tablet Take 1 tablet (5 mg total) by mouth 2 (two) times daily as needed for muscle spasms. 6 tablet 0   donepezil (ARICEPT) 10 MG tablet Take half tablet (5 mg) daily for 2 weeks, then one tablet daily. 30 tablet 11   DULoxetine (CYMBALTA) 20 MG capsule Take 1 capsule (20 mg total) by mouth daily. 30 capsule 5   ferrous sulfate 325 (65 FE) MG  tablet Take 1 tablet (325 mg total) by mouth 2 (two) times daily. Patient needs office visit for further refills (Patient taking differently: Take 325 mg by mouth daily with breakfast.) 60 tablet 1   fluocinonide-emollient (LIDEX-E) 0.05 % cream Apply 1 Application topically 2 (two) times daily. 60 g 2   furosemide (LASIX) 40 MG tablet TAKE 1 TABLET BY MOUTH EVERY DAY 90 tablet 1   isosorbide mononitrate (IMDUR) 60 MG 24 hr tablet Take 1 tablet (60 mg total) by mouth daily. 30 tablet 2   metoprolol tartrate (LOPRESSOR) 25 MG tablet TAKE 1 TABLET BY MOUTH TWICE A DAY 180 tablet 3   mirtazapine (REMERON SOL-TAB) 15 MG disintegrating tablet Take 15 mg by mouth at bedtime.     pantoprazole (PROTONIX) 20 MG tablet Take 1 tablet (20 mg total) by mouth daily. (Patient taking differently: Take 20 mg by mouth daily as needed for heartburn or indigestion.) 30 tablet 0   polyvinyl alcohol (LIQUIFILM TEARS) 1.4 % ophthalmic solution Place 1 drop into both eyes daily as needed for dry eyes.     sodium bicarbonate 650 MG tablet Take 2 tablets (1,300 mg total) by mouth 2 (two) times daily. 60 tablet 1   solifenacin (VESICARE) 5 MG tablet TAKE 1 TABLET (5 MG TOTAL) BY MOUTH DAILY. 90 tablet 1   tamsulosin (FLOMAX) 0.4 MG CAPS capsule TAKE 1 CAPSULE (0.4 MG TOTAL) BY MOUTH DAILY. (Patient taking  differently: Take 0.4 mg by mouth daily.) 30 capsule 1   traMADol (ULTRAM) 50 MG tablet Take 1 tablet (50 mg total) by mouth every 12 (twelve) hours as needed. 30 tablet 2   zinc gluconate 50 MG tablet Take 1 tablet (50 mg total) by mouth daily. 90 tablet 1   No facility-administered medications prior to visit.    ROS Review of Systems  Constitutional:  Positive for appetite change, fatigue and unexpected weight change (wt loss).  Eyes: Negative.   Respiratory:  Negative for cough, chest tightness, shortness of breath and wheezing.   Cardiovascular:  Negative for chest pain, palpitations and leg swelling.   Gastrointestinal:  Positive for abdominal pain. Negative for constipation, diarrhea, nausea and vomiting.  Endocrine: Negative.   Genitourinary: Negative.  Negative for difficulty urinating.  Musculoskeletal:  Positive for arthralgias, back pain and neck pain. Negative for myalgias.  Neurological: Negative.  Negative for dizziness.  Hematological:  Negative for adenopathy. Does not bruise/bleed easily.  Psychiatric/Behavioral: Negative.      Objective:  BP 128/76 (BP Location: Right Arm, Patient Position: Sitting, Cuff Size: Large)   Pulse 69   Temp 98.3 F (36.8 C) (Oral)   Ht 6\' 3"  (1.905 m)   Wt 194 lb (88 kg)   SpO2 93%   BMI 24.25 kg/m   BP Readings from Last 3 Encounters:  11/08/22 128/76  11/02/22 (!) 168/98  10/30/22 (!) 160/90    Wt Readings from Last 3 Encounters:  11/08/22 194 lb (88 kg)  11/02/22 199 lb 8 oz (90.5 kg)  10/30/22 200 lb 9.9 oz (91 kg)    Physical Exam Vitals reviewed.  Constitutional:      Appearance: He is not ill-appearing.  HENT:     Mouth/Throat:     Mouth: Mucous membranes are moist.  Eyes:     General: No scleral icterus.    Conjunctiva/sclera: Conjunctivae normal.  Cardiovascular:     Rate and Rhythm: Normal rate and regular rhythm.     Heart sounds: Murmur heard.     Systolic murmur is present with a grade of 2/6.     No diastolic murmur is present.  Pulmonary:     Effort: Pulmonary effort is normal.     Breath sounds: No stridor. Examination of the right-lower field reveals rales. Examination of the left-lower field reveals rales. Rales present. No decreased breath sounds, wheezing or rhonchi.  Abdominal:     General: Abdomen is flat.     Palpations: There is no mass.     Tenderness: There is no abdominal tenderness. There is no guarding.     Hernia: No hernia is present.  Musculoskeletal:        General: Normal range of motion.     Right lower leg: No edema.     Left lower leg: No edema.  Skin:    General: Skin is warm  and dry.  Neurological:     General: No focal deficit present.     Mental Status: He is alert. Mental status is at baseline.  Psychiatric:        Mood and Affect: Mood normal.        Behavior: Behavior normal.     Lab Results  Component Value Date   WBC 3.0 (L) 10/30/2022   HGB 10.2 (L) 10/30/2022   HCT 30.0 (L) 10/30/2022   PLT 172 10/30/2022   GLUCOSE 110 (H) 10/30/2022   CHOL 227 (H) 08/25/2022   TRIG 79 08/25/2022  HDL 47 08/25/2022   LDLCALC 164 (H) 08/25/2022   ALT 31 10/30/2022   AST 24 10/30/2022   NA 139 10/30/2022   K 4.5 10/30/2022   CL 109 10/30/2022   CREATININE 10.80 (H) 10/30/2022   BUN 57 (H) 10/30/2022   CO2 16 (L) 10/30/2022   TSH 2.577 10/04/2021   PSA 0.01 (L) 05/20/2020   INR 1.1 10/30/2022   HGBA1C 5.4 08/25/2022   MICROALBUR 97.0 12/18/2019    DG Chest Port 1 View  Result Date: 10/30/2022 CLINICAL DATA:  sob EXAM: PORTABLE CHEST 1 VIEW COMPARISON:  Chest x-ray April 11, 24. FINDINGS: Limited study with mild streaky left basilar opacities. No confluent consolidation. Enlarged cardiac silhouette. Pulmonary vascular congestion. Suspected small bilateral pleural effusions. No visible pneumothorax. ACDF, partially imaged. IMPRESSION: 1. Cardiomegaly and pulmonary vascular congestion. No overt pulmonary edema. 2. Suspected small bilateral pleural effusions. 3. Limited study with mild streaky left basilar opacities, favor atelectasis. Electronically Signed   By: Feliberto Harts M.D.   On: 10/30/2022 12:45    Assessment & Plan:   Cancer related pain -     Xtampza ER; Take 1 capsule by mouth 2 (two) times daily as needed.  Dispense: 60 capsule; Refill: 0  DDD (degenerative disc disease), cervical -     Xtampza ER; Take 1 capsule by mouth 2 (two) times daily as needed.  Dispense: 60 capsule; Refill: 0  Spinal stenosis of lumbar region with radiculopathy -     Xtampza ER; Take 1 capsule by mouth 2 (two) times daily as needed.  Dispense: 60 capsule;  Refill: 0  Encounter for palliative care involving management of pain -     Xtampza ER; Take 1 capsule by mouth 2 (two) times daily as needed.  Dispense: 60 capsule; Refill: 0  Atherosclerosis of aorta (HCC) - Risk factor modifications addressed.  Renal cell carcinoma of right kidney metastatic to other site [facility]) - This is being treated.     Follow-up: Return if symptoms worsen or fail to improve.  Sanda Linger, MD

## 2022-11-09 ENCOUNTER — Ambulatory Visit: Payer: Medicare Other | Admitting: Speech Pathology

## 2022-11-09 DIAGNOSIS — M6281 Muscle weakness (generalized): Secondary | ICD-10-CM | POA: Diagnosis not present

## 2022-11-09 DIAGNOSIS — R471 Dysarthria and anarthria: Secondary | ICD-10-CM

## 2022-11-09 DIAGNOSIS — R2681 Unsteadiness on feet: Secondary | ICD-10-CM | POA: Diagnosis not present

## 2022-11-09 DIAGNOSIS — R2689 Other abnormalities of gait and mobility: Secondary | ICD-10-CM | POA: Diagnosis not present

## 2022-11-09 NOTE — Therapy (Signed)
OUTPATIENT SPEECH LANGUAGE PATHOLOGY TREATMENT   Patient Name: Antonio Adams MRN: [id number] DOB:June 02, 1945, 77 y.o., male Today's Date: 11/09/2022  PCP: Etta Grandchild, MD REFERRING PROVIDER: Etta Grandchild, MD  END OF SESSION:  End of Session - 11/09/22 1014     Visit Number 5    Number of Visits 25    Date for SLP Re-Evaluation 01/16/23    Authorization Type [employer] Medicare    SLP Start Time 1015    SLP Stop Time  1100    SLP Time Calculation (min) 45 min    Activity Tolerance Patient tolerated treatment well                Past Medical History:  Diagnosis Date   ADENOCARCINOMA, PROSTATE, GLEASON GRADE 6 12/21/2009   prostate cancer   ALLERGIC RHINITIS 03/16/2007   Allergy    Anxiety    Arthritis    back    BACK PAIN WITH RADICULOPATHY 11/23/2008   Cancer of kidney (HCC)    partial right kidney removed   CARPAL TUNNEL SYNDROME, BILATERAL 03/16/2007   Chronic back pain    Constipation    takes Senokot daily   Depression    occasionally   DIABETES MELLITUS, TYPE II 03/16/2007   takes Januvia and MEtformin daily   GASTROINTESTINAL HEMORRHAGE, HX OF 03/16/2007   GERD 03/16/2007   takes Omeprazole daily   Glaucoma    mild - no eye drops   Hemorrhoids    History of colon polyps    HYPERLIPIDEMIA 03/16/2007   takes Crestor daily   HYPERTENSION 03/16/2007   takes Diltiazem and Lisinopril daily    Kidney cancer, primary, with metastasis from kidney to other site [facility])    LEG CRAMPS 05/22/2007   Nocturia    Overactive bladder    PARESTHESIA 05/21/2007   Proctitis    PTSD (post-traumatic stress disorder)    wakes up may be dreaming of fighting   Rectal bleeding    Dr.Norins has explained its from the Radiation that he has received   Rectal bleeding    Renal cell carcinoma (HCC)    Sleep apnea    uses CPAP nightly   Stomach cancer (HCC)    Urinary frequency    takes Toviaz daily   Past Surgical History:  Procedure Laterality Date   ADRENALECTOMY Right  02/26/2020   Procedure: OPEN RIGHT RENAL CELL METASTATECTOMY AND  ADRENALECTOMY;  Surgeon: Crist Fat, MD;  Location: [facility];  Service: Urology;  Laterality: Right;   BACK SURGERY  1983, 2008   repeat surgery; ESI '08   CARPAL TUNNEL RELEASE     bilateral   CERVICAL FUSION     COLONOSCOPY     colonosocpy     ESOPHAGOGASTRODUODENOSCOPY     ED   FLEXIBLE SIGMOIDOSCOPY N/A 01/08/2017   Procedure: FLEXIBLE SIGMOIDOSCOPY;  Surgeon: Hilarie Fredrickson, MD;  Location: [facility] ENDOSCOPY;  Service: Endoscopy;  Laterality: N/A;   HOT HEMOSTASIS N/A 01/08/2017   Procedure: HOT HEMOSTASIS (ARGON PLASMA COAGULATION/BICAP);  Surgeon: Hilarie Fredrickson, MD;  Location: [facility] ENDOSCOPY;  Service: Endoscopy;  Laterality: N/A;   IR RADIOLOGIST EVAL & MGMT  12/17/2019   IR RADIOLOGIST EVAL & MGMT  07/01/2020   IR RADIOLOGIST EVAL & MGMT  08/25/2020   LUMBAR LAMINECTOMY  10/30/2011   Procedure: MICRODISCECTOMY LUMBAR LAMINECTOMY;  Surgeon: Kerrin Champagne, MD;  Location: [facility] OR;  Service: Orthopedics;  Laterality: Right;  Right L4-5 and L5-S1 Microdiscectomy   LUMBAR  LAMINECTOMY  06/17/2012   Procedure: MICRODISCECTOMY LUMBAR LAMINECTOMY;  Surgeon: Kerrin Champagne, MD;  Location: [facility] OR;  Service: Orthopedics;  Laterality: N/A;  Right L5-S1 microdiscectomy   OPEN PARTIAL HEPATECTOMY  N/A 02/26/2020   Procedure: OPEN PARTIAL HEPATECTOMY, CHOLECYSTECTOMY;  Surgeon: Almond Lint, MD;  Location: [facility];  Service: General;  Laterality: N/A;   POLYPECTOMY     PROSTATE SURGERY  march 2012   seed implant   RADIOFREQUENCY ABLATION N/A 07/21/2020   Procedure: CT MICROWAVE ABLATION;  Surgeon: Sterling Big, MD;  Location: [facility];  Service: Anesthesiology;  Laterality: N/A;   ROBOTIC ASSITED PARTIAL NEPHRECTOMY Right 09/08/2015   Procedure: XI ROBOTIC ASSITED RIGHT PARTIAL NEPHRECTOMY;  Surgeon: Crist Fat, MD;  Location: [facility];  Service: Urology;  Laterality: Right;  Clamp on: 1033 Clamp off: 1103 Total Clamp Time: 30 minutes    SHOULDER ARTHROSCOPY WITH SUBACROMIAL DECOMPRESSION Left 01/20/2016   Procedure: LEFT SHOULDER ARTHROSCOPY WITH EXTENSIVE  DEBRIDEMENT, ACROMIOPLASTY;  Surgeon: Loreta Ave, MD;  Location: [facility];  Service: Orthopedics;  Laterality: Left;   SHOULDER SURGERY  LFT   Stress Cardiolite  12/24/2003   negative for ischemia   UPPER ESOPHAGEAL ENDOSCOPIC ULTRASOUND (EUS)  04/18/2016   [facility]   UPPER GASTROINTESTINAL ENDOSCOPY     WOUND EXPLORATION     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   Patient Active Problem List   Diagnosis Date Noted   Encounter for palliative care involving management of pain 11/02/2022   Encounter for general adult medical examination with abnormal findings 01/23/2022   Anemia due to zinc deficiency 12/20/2021   Symptomatic anemia 12/15/2021   Cancer related pain 12/15/2021   Chronic heart failure with preserved ejection fraction (HFpEF) (HCC) 08/12/2021   Tinea cruris 08/12/2021   Anemia due to chronic kidney disease 07/20/2021   CKD (chronic kidney disease) stage 5, GFR less than 15 ml/min (HCC) 05/24/2021   BPH (benign prostatic hyperplasia) 05/24/2021   Senile nuclear sclerosis 05/20/2021   Vitamin D deficiency 05/20/2021   Vitreous degeneration, unspecified eye 05/20/2021   Intrinsic eczema 04/18/2021   Atherosclerosis of aorta (HCC) 12/30/2020   Mixed conductive and sensorineural hearing loss of left ear with restricted hearing of right ear 09/01/2020   Metastatic renal cell carcinoma (HCC) 02/26/2020   Diabetic polyneuropathy associated with type 2 diabetes mellitus (HCC) 12/18/2019   Murmur, cardiac 10/08/2018   DDD (degenerative disc disease), cervical 10/08/2018   OAB (overactive bladder) 04/04/2018   Seasonal allergic rhinitis due to pollen 11/01/2016   Spinal stenosis of lumbar region with radiculopathy 10/30/2011    Class: Chronic   Obstructive sleep apnea 11/24/2010   Cognitive impairment 11/24/2010    ADENOCARCINOMA, PROSTATE, GLEASON GRADE 6 12/21/2009   Essential hypertension 03/16/2007   GERD 03/16/2007    ONSET DATE: 08/24/22   REFERRING DIAG:  THERAPY DIAG:  I63.9 (ICD-10-CM) - Acute ischemic stroke (HCC)  Rationale for Evaluation and Treatment: Rehabilitation  SUBJECTIVE:   SUBJECTIVE STATEMENT: "Going to a funeral after."  Pt accompanied by:  fiance  PERTINENT HISTORY: Pt is a 77 y.o. male who presents with episode of confusion/hallucinations. MRI (08/24/22) revealed "Acute left parieto-occipital periventricular infarcts in the region of prior infarct". SLE after previous CVA (04/09/22) noted cognitive impairment (memory/problem solving) with pt receiving OP SLP services post discharge. PMH: GERD, prior L PCA stroke, metastatic RCC, prostate cancer, stage V CKD declining dialysis.   PAIN:  Are you having pain? No  FALLS: Has patient fallen in last 6 months?  See PT evaluation for details  LIVING ENVIRONMENT: Lives with: lives alone - fiance staying with him during the day until bedtime Lives in: House/apartment  PLOF:  Level of assistance: Independent with ADLs, Needed assistance with IADLS Employment: Retired  PATIENT GOALS: To manage AM meds, calendar, to get back to doing things" fiance  OBJECTIVE:   DIAGNOSTIC FINDINGS: Acute left parieto-occipital periventricular infarcts in the region of prior infarct.  COGNITION: Overall cognitive status: Impaired and Decline since CVA and course of ST in Feb 2024 Areas of impairment:  Attention: Impaired: Sustained, Selective, Alternating Memory: Impaired: Immediate Working Short term Engineering geologist Awareness: Impaired: Intellectual Executive function: Impaired: Initiation, Problem solving, Organization, Planning, Self-correction, and Slow processing Behavior: WNL Functional deficits: meds, bills, household chores, humor  COGNITIVE COMMUNICATION: Following directions: Follows one step commands  consistently  Auditory comprehension: Impaired: due to slow processing Verbal expression: Impaired: word finding difficulty noted in naming restaurants he frequents Functional communication: WFL  ORAL MOTOR EXAMINATION: Overall status: WFL Comments:   STANDARDIZED ASSESSMENTS: N/A - moderate impairments on CLQT prior eval   MOTOR SPEECH:  Volume: Averaged 54dB in conversation Low vocal intensity Voice and Resonance WNL Motor Planning WNL Intelligibility reduced at sentence level   TODAY'S TREATMENT:                                                                                                                                         DATE:   11/09/22: SLP led patient through procedural tasks with sequence of tasks into 4-6 steps, and read through the procedures with low volume. Patient required mod-A of reminders to speak up from both SLP and spouse. SLP led through cognitive flexibility task, and patient required min-A of guiding questions, and semantic cues to determine different options needed. Patient rated voice today at a 5 on 10 point scale, which matched SLP perception. Education provided on relying on feedback of communication partners to increase volume.   11/07/22: Pt states that schedule and medication is making slow progress. There was a death in the family, and pt demonstrated a low, unmotivated mood due to hard news in the family. Pt's voice was at a low volume today during unstructured conversation and was able to increase when he was passionate about the topic. Pt benefited from mod-A models, gestural cues, and prompts to increase volume. Patient said that he will increase HEP in the coming weeks. Reviewed previously generated schedule and encouraged pt to begin tomorrow, as he has not yet.   11/01/22: Pt enters room responding at 60dB - HEP for dysarthria with usual mod modeling, gestures and verbal cues Kylee averaged 80dB on loud Ah! Repeating conversational phrases with  mod A he averaged 68dB. In structured task generating 3 sentence descriptions  of basic objects, [place] required frequent mod verbal cues, modeling and gestures to achieve 68dB (  65 was average). Conversation re: items In between descriptions also required frequent mod to max A to achieve 65dB. We generated a schedule of getting out of bed at 9:00, going to kitchen, taking meds, eating a snack and looking at his calendar before going back to sleep to facilitated independence with am meds. Niv generated schedule of waking up again at 11:00 and getting showered and dressed then having lunch (see patient instructions) ongoing education re: benefits of a consistent sleep/wake  and daily schedule in building stamina and endurance post stroke.   10/27/22: Patient averaged 65 dB with occasional min-A reminder to speak louder and visual feedback by reading the audio reader. Vocal volume decreased during unstructured conversation, and patient was unable to carry loud voice over without prompting. Calendar drafted of two household chores of vacuuming and washing clothes. Targeted voice through [employer] voice project to lead through structured and unstructured conversation.   10/24/22 (eval day): Initiated training in compensations for memory, attention, awareness including reinstating calendar and to do list - need for cues initially for 2-3 weeks to get Lukka in the habit of using the calendar and list. Cognitive activities to do at home, use of schedule for am meds. Initiated training in HEP for dysarthria - Loud Ah averaged 80dB with frequent mod verbal cues, modeling. Oral reading conversational phrases averaged 68dB with usual mod modeling, verbal and gesture cues.   PATIENT EDUCATION: Education details: See Today's Treatment, Patient Instructions, compensations for cognition; HEP for dysarthria Person educated: Patient and Spouse Education method: Explanation, Demonstration, Verbal cues, and  Handouts Education comprehension: verbal cues required and needs further education   GOALS: Goals reviewed with patient? Yes  SHORT TERM GOALS: Target date: 11/21/22  Pt will average 85dB on loud Ah with rare min A Baseline: Goal status: IN PROGRESS  2.  Pt will average 72dB on 18/20 10 word sentences with rare min A Baseline:  Goal status: IN PROGRESS  3.  Pt will use calendar and to do list to complete 2 household chores a day 5/7 days and recall appointments, social events with occasional min A from fiance Baseline:  Goal status: IN PROGRESS  4.  Pt will plan ahead 4 healthy meals for restaurants for 1 week using internet menus with occasional min A to target executive function and decision making Baseline:  Goal status: IN PROGRESS  5.  Pt will manage AM meds with external aids and occasional min A from fiance over 1 week Baseline:  Goal status: IN PROGRESS  6.  Pt will keep a schedule of getting out of bed upon waking for 2 hours before napping Baseline:  Goal status: IN PROGRESS  LONG TERM GOALS: Target date: 01/16/23  Pt will manage am meds with none missed with rare min A from family over 1 week Baseline:  Goal status: IN PROGRESS  2.  Pt will use calendar and to do list to be ready for appointments and complete daily household tasks with rare min A from family  Baseline:  Goal status: IN PROGRESS  3.  Pt will average 70dB in structure speech tasks for 15 minutes with rare min A Baseline:  Goal status: IN PROGRESS  4.  Pt will average 70dB over 18 minute conversation with rare min A Baseline:  Goal status: IN PROGRESS  5.  Pt will require 2 or less requests for repetition when eating out in noisy environment Baseline:  Goal status: IN PROGRESS  6.  Pt will plan and attend 2  social events (men's breakfast, family event) using external aids to recall his ride and times Baseline:  Goal status: IN PROGRESS  ASSESSMENT:  CLINICAL IMPRESSION: Patient is a  77 y.o. male who was seen today for cognition and dysarthria. He is known to [place] from prior course of ST where he made good progress and was managing his meds, paying bills, managing his appointments and schedules. Since prior course, he has had a second CVA. His fiance [name] reports significant decline. Today he presents with moderate cognitive communication impairment and moderate hypokinetic dysarthria. Since 2nd CVA Carolan Clines is having to manage all medications and bills. She is having to stay at his house am through bed time due to cognitive impairments. She endorses his volume is soft and she frequently needs him to repeat. She notes that wait staff often ask him to repeat as well. At this time, Pear observes poor initiation. Prior to 2nd CVA Zykeem was fastidious with cleaning and organizing his home. He was doing his own laundry which she is now doing. He is not completing any chores. Brok wakes up, asks Pear his schedule and if he has nothing, he stays in bed and goes back to sleep until noon or 1:00. Carolan Clines is concerned that he is not taking his am meds until 1:00 so she has to go over, wake him and give him his meds. Paris verbalizes no awareness of his change In speech or his lack of initiation and poor sleep schedule. Leonette Most and Shannon both endorse that he does not cook, but they eat out every day. This is concerning due to recurrent CVA.  After trials of exuberant voice therapy, he did note" I was in the [employer] and I would have to talk louder than this" I recommend skilled ST to maximize intelligibility and cognition for safety, independence and to reduce caregiver burden.   OBJECTIVE IMPAIRMENTS: include attention, memory, awareness, executive functioning, expressive language, and dysarthria. These impairments are limiting patient from managing medications, managing appointments, managing finances, household responsibilities, ADLs/IADLs, and effectively communicating at home and in  community. Factors affecting potential to achieve goals and functional outcome are previous level of function.. Patient will benefit from skilled SLP services to address above impairments and improve overall function.  REHAB POTENTIAL: Good  PLAN:  SLP FREQUENCY: 2x/week  SLP DURATION: 12 weeks  PLANNED INTERVENTIONS: Language facilitation, Cueing hierachy, Cognitive reorganization, Internal/external aids, Oral motor exercises, Functional tasks, Multimodal communication approach, and SLP instruction and feedback    [place], Student-SLP 11/09/2022, 10:14 AM

## 2022-11-12 ENCOUNTER — Other Ambulatory Visit: Payer: Self-pay | Admitting: Internal Medicine

## 2022-11-13 ENCOUNTER — Ambulatory Visit (HOSPITAL_COMMUNITY)
Admission: RE | Admit: 2022-11-13 | Discharge: 2022-11-13 | Disposition: A | Discharge status: Home / Self Care | Payer: Medicare Other | Source: Ambulatory Visit | Attending: Nephrology | Admitting: Nephrology

## 2022-11-13 ENCOUNTER — Ambulatory Visit: Payer: Medicare Other | Admitting: Speech Pathology

## 2022-11-13 VITALS — BP 154/87 | HR 66 | Temp 96.1°F

## 2022-11-13 DIAGNOSIS — N185 Chronic kidney disease, stage 5: Secondary | ICD-10-CM | POA: Insufficient documentation

## 2022-11-13 LAB — POCT HEMOGLOBIN-HEMACUE: Hemoglobin: 9.8 g/dL — ABNORMAL LOW (ref 13.0–17.0)

## 2022-11-13 MED ORDER — EPOETIN ALFA-EPBX 40000 UNIT/ML IJ SOLN
INTRAMUSCULAR | Status: AC
  Administered 2022-11-13: 30000 [IU] via SUBCUTANEOUS
  Filled 2022-11-13: qty 1

## 2022-11-13 MED ORDER — EPOETIN ALFA-EPBX 40000 UNIT/ML IJ SOLN: 30000.0000 [IU] | INTRAMUSCULAR | Status: DC

## 2022-11-15 ENCOUNTER — Ambulatory Visit: Payer: Medicare Other | Attending: Internal Medicine | Admitting: Speech Pathology

## 2022-11-15 ENCOUNTER — Encounter: Payer: Self-pay | Admitting: Speech Pathology

## 2022-11-15 DIAGNOSIS — R41841 Cognitive communication deficit: Secondary | ICD-10-CM | POA: Insufficient documentation

## 2022-11-15 DIAGNOSIS — R471 Dysarthria and anarthria: Secondary | ICD-10-CM | POA: Diagnosis not present

## 2022-11-15 NOTE — Therapy (Signed)
OUTPATIENT SPEECH LANGUAGE PATHOLOGY TREATMENT   Patient Name: Antonio Adams MRN: [id number] DOB:[birth year], 77 y.o., male Today's Date: 11/15/2022  PCP: Etta Grandchild, MD REFERRING PROVIDER: Etta Grandchild, MD  END OF SESSION:  End of Session - 11/15/22 1108     Visit Number 6    Number of Visits 25    Date for SLP Re-Evaluation 01/16/23    Authorization Type [employer] Medicare    SLP Start Time 1110   arrived late   SLP Stop Time  1145    SLP Time Calculation (min) 35 min                Past Medical History:  Diagnosis Date   ADENOCARCINOMA, PROSTATE, GLEASON GRADE 6 12/21/2009   prostate cancer   ALLERGIC RHINITIS 03/16/2007   Allergy    Anxiety    Arthritis    back    BACK PAIN WITH RADICULOPATHY 11/23/2008   Cancer of kidney (HCC)    partial right kidney removed   CARPAL TUNNEL SYNDROME, BILATERAL 03/16/2007   Chronic back pain    Constipation    takes Senokot daily   Depression    occasionally   DIABETES MELLITUS, TYPE II 03/16/2007   takes Januvia and MEtformin daily   GASTROINTESTINAL HEMORRHAGE, HX OF 03/16/2007   GERD 03/16/2007   takes Omeprazole daily   Glaucoma    mild - no eye drops   Hemorrhoids    History of colon polyps    HYPERLIPIDEMIA 03/16/2007   takes Crestor daily   HYPERTENSION 03/16/2007   takes Diltiazem and Lisinopril daily    Kidney cancer, primary, with metastasis from kidney to other site [facility])    LEG CRAMPS 05/22/2007   Nocturia    Overactive bladder    PARESTHESIA 05/21/2007   Proctitis    PTSD (post-traumatic stress disorder)    wakes up may be dreaming of fighting   Rectal bleeding    Dr.Norins has explained its from the Radiation that he has received   Rectal bleeding    Renal cell carcinoma (HCC)    Sleep apnea    uses CPAP nightly   Stomach cancer (HCC)    Urinary frequency    takes Toviaz daily   Past Surgical History:  Procedure Laterality Date   ADRENALECTOMY Right 02/26/2020   Procedure: OPEN RIGHT RENAL  CELL METASTATECTOMY AND  ADRENALECTOMY;  Surgeon: Crist Fat, MD;  Location: [facility];  Service: Urology;  Laterality: Right;   BACK SURGERY  1983, 2008   repeat surgery; ESI '08   CARPAL TUNNEL RELEASE     bilateral   CERVICAL FUSION     COLONOSCOPY     colonosocpy     ESOPHAGOGASTRODUODENOSCOPY     ED   FLEXIBLE SIGMOIDOSCOPY N/A 01/08/2017   Procedure: FLEXIBLE SIGMOIDOSCOPY;  Surgeon: Hilarie Fredrickson, MD;  Location: [facility] ENDOSCOPY;  Service: Endoscopy;  Laterality: N/A;   HOT HEMOSTASIS N/A 01/08/2017   Procedure: HOT HEMOSTASIS (ARGON PLASMA COAGULATION/BICAP);  Surgeon: Hilarie Fredrickson, MD;  Location: [facility] ENDOSCOPY;  Service: Endoscopy;  Laterality: N/A;   IR RADIOLOGIST EVAL & MGMT  12/17/2019   IR RADIOLOGIST EVAL & MGMT  07/01/2020   IR RADIOLOGIST EVAL & MGMT  08/25/2020   LUMBAR LAMINECTOMY  10/30/2011   Procedure: MICRODISCECTOMY LUMBAR LAMINECTOMY;  Surgeon: Kerrin Champagne, MD;  Location: [facility] OR;  Service: Orthopedics;  Laterality: Right;  Right L4-5 and L5-S1 Microdiscectomy   LUMBAR LAMINECTOMY  06/17/2012   Procedure:  MICRODISCECTOMY LUMBAR LAMINECTOMY;  Surgeon: Kerrin Champagne, MD;  Location: [facility] OR;  Service: Orthopedics;  Laterality: N/A;  Right L5-S1 microdiscectomy   OPEN PARTIAL HEPATECTOMY  N/A 02/26/2020   Procedure: OPEN PARTIAL HEPATECTOMY, CHOLECYSTECTOMY;  Surgeon: Almond Lint, MD;  Location: [facility];  Service: General;  Laterality: N/A;   POLYPECTOMY     PROSTATE SURGERY  march 2012   seed implant   RADIOFREQUENCY ABLATION N/A 07/21/2020   Procedure: CT MICROWAVE ABLATION;  Surgeon: Sterling Big, MD;  Location: [facility];  Service: Anesthesiology;  Laterality: N/A;   ROBOTIC ASSITED PARTIAL NEPHRECTOMY Right 09/08/2015   Procedure: XI ROBOTIC ASSITED RIGHT PARTIAL NEPHRECTOMY;  Surgeon: Crist Fat, MD;  Location: [facility];  Service: Urology;  Laterality: Right;  Clamp on: 1033 Clamp off: 1103 Total Clamp Time: 30 minutes   SHOULDER ARTHROSCOPY WITH SUBACROMIAL  DECOMPRESSION Left 01/20/2016   Procedure: LEFT SHOULDER ARTHROSCOPY WITH EXTENSIVE  DEBRIDEMENT, ACROMIOPLASTY;  Surgeon: Loreta Ave, MD;  Location: [facility];  Service: Orthopedics;  Laterality: Left;   SHOULDER SURGERY  LFT   Stress Cardiolite  12/24/2003   negative for ischemia   UPPER ESOPHAGEAL ENDOSCOPIC ULTRASOUND (EUS)  04/18/2016   [facility]   UPPER GASTROINTESTINAL ENDOSCOPY     WOUND EXPLORATION     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   Patient Active Problem List   Diagnosis Date Noted   Encounter for palliative care involving management of pain 11/02/2022   Encounter for general adult medical examination with abnormal findings 01/23/2022   Anemia due to zinc deficiency 12/20/2021   Symptomatic anemia 12/15/2021   Cancer related pain 12/15/2021   Chronic heart failure with preserved ejection fraction (HFpEF) (HCC) 08/12/2021   Tinea cruris 08/12/2021   Anemia due to chronic kidney disease 07/20/2021   CKD (chronic kidney disease) stage 5, GFR less than 15 ml/min (HCC) 05/24/2021   BPH (benign prostatic hyperplasia) 05/24/2021   Senile nuclear sclerosis 05/20/2021   Vitamin D deficiency 05/20/2021   Vitreous degeneration, unspecified eye 05/20/2021   Intrinsic eczema 04/18/2021   Atherosclerosis of aorta (HCC) 12/30/2020   Mixed conductive and sensorineural hearing loss of left ear with restricted hearing of right ear 09/01/2020   Metastatic renal cell carcinoma (HCC) 02/26/2020   Diabetic polyneuropathy associated with type 2 diabetes mellitus (HCC) 12/18/2019   Murmur, cardiac 10/08/2018   DDD (degenerative disc disease), cervical 10/08/2018   OAB (overactive bladder) 04/04/2018   Seasonal allergic rhinitis due to pollen 11/01/2016   Spinal stenosis of lumbar region with radiculopathy 10/30/2011    Class: Chronic   Obstructive sleep apnea 11/24/2010   Cognitive impairment 11/24/2010   ADENOCARCINOMA, PROSTATE, GLEASON  GRADE 6 12/21/2009   Essential hypertension 03/16/2007   GERD 03/16/2007    ONSET DATE: 08/24/22   REFERRING DIAG:  THERAPY DIAG:  I63.9 (ICD-10-CM) - Acute ischemic stroke (HCC)  Rationale for Evaluation and Treatment: Rehabilitation  SUBJECTIVE:   SUBJECTIVE STATEMENT: "Going to a funeral after."  Pt accompanied by:  fiance  PERTINENT HISTORY: Pt is a 77 y.o. male who presents with episode of confusion/hallucinations. MRI (08/24/22) revealed "Acute left parieto-occipital periventricular infarcts in the region of prior infarct". SLE after previous CVA (04/09/22) noted cognitive impairment (memory/problem solving) with pt receiving OP SLP services post discharge. PMH: GERD, prior L PCA stroke, metastatic RCC, prostate cancer, stage V CKD declining dialysis.   PAIN:  Are you having pain? No  FALLS: Has patient fallen in  last 6 months?  See PT evaluation for details  LIVING ENVIRONMENT: Lives with: lives alone - fiance staying with him during the day until bedtime Lives in: House/apartment  PLOF:  Level of assistance: Independent with ADLs, Needed assistance with IADLS Employment: Retired  PATIENT GOALS: To manage AM meds, calendar, to get back to doing things" fiance  OBJECTIVE:   DIAGNOSTIC FINDINGS: Acute left parieto-occipital periventricular infarcts in the region of prior infarct.  COGNITION: Overall cognitive status: Impaired and Decline since CVA and course of ST in Feb 2024 Areas of impairment:  Attention: Impaired: Sustained, Selective, Alternating Memory: Impaired: Immediate Working Short term Engineering geologist Awareness: Impaired: Intellectual Executive function: Impaired: Initiation, Problem solving, Organization, Planning, Self-correction, and Slow processing Behavior: WNL Functional deficits: meds, bills, household chores, humor  COGNITIVE COMMUNICATION: Following directions: Follows one step commands consistently  Auditory comprehension:  Impaired: due to slow processing Verbal expression: Impaired: word finding difficulty noted in naming restaurants he frequents Functional communication: WFL  ORAL MOTOR EXAMINATION: Overall status: WFL Comments:   STANDARDIZED ASSESSMENTS: N/A - moderate impairments on CLQT prior eval   MOTOR SPEECH:  Volume: Averaged 54dB in conversation Low vocal intensity Voice and Resonance WNL Motor Planning WNL Intelligibility reduced at sentence level  Subjective: Pain 8/10 - back, did not take pain meds or tylenol "He didn't tell me about this until now" TODAY'S TREATMENT:                                                                                                                                         DATE:   11/15/22: Antonio Adams has 8/10 back pain today with flat affect and low volume. Due to pain, limited volume addressed. He has gotten up and out of bed earlier the past 2 days. He is recalling his am meds more consistently with external aids. Fiance is there for supervision aid. Targeted volume generating similarities and differences for related objects Antonio Adams required frequent verbal cues, modeling to achieve 70dB 8/12 sentences. Antonio Adams continues to have impaired vision - we generated strategy of obtaining a larger calendar in his kitchen. They will f/u with this before next session.   11/09/22: SLP led patient through procedural tasks with sequence of tasks into 4-6 steps, and read through the procedures with low volume. Patient required mod-A of reminders to speak up from both SLP and spouse. SLP led through cognitive flexibility task, and patient required min-A of guiding questions, and semantic cues to determine different options needed. Patient rated voice today at a 5 on 10 point scale, which matched SLP perception. Education provided on relying on feedback of communication partners to increase volume.   11/07/22: Pt states that schedule and medication is making slow progress. There was  a death in the family, and pt demonstrated a low, unmotivated mood due to hard news in the family. Pt's voice was at a low volume today  during unstructured conversation and was able to increase when he was passionate about the topic. Pt benefited from mod-A models, gestural cues, and prompts to increase volume. Patient said that he will increase HEP in the coming weeks. Reviewed previously generated schedule and encouraged pt to begin tomorrow, as he has not yet.   11/01/22: Pt enters room responding at 60dB - HEP for dysarthria with usual mod modeling, gestures and verbal cues Antonio Adams averaged 80dB on loud Ah! Repeating conversational phrases with mod A he averaged 68dB. In structured task generating 3 sentence descriptions  of basic objects, Antonio Adams required frequent mod verbal cues, modeling and gestures to achieve 68dB (65 was average). Conversation re: items In between descriptions also required frequent mod to max A to achieve 65dB. We generated a schedule of getting out of bed at 9:00, going to kitchen, taking meds, eating a snack and looking at his calendar before going back to sleep to facilitated independence with am meds. Antonio Adams generated schedule of waking up again at 11:00 and getting showered and dressed then having lunch (see patient instructions) ongoing education re: benefits of a consistent sleep/wake  and daily schedule in building stamina and endurance post stroke.   10/27/22: Patient averaged 65 dB with occasional min-A reminder to speak louder and visual feedback by reading the audio reader. Vocal volume decreased during unstructured conversation, and patient was unable to carry loud voice over without prompting. Calendar drafted of two household chores of vacuuming and washing clothes. Targeted voice through park[employer] voice project to lead through structured and unstructured conversation.   10/24/22 (eval day): Initiated training in compensations for memory, attention, awareness  including reinstating calendar and to do list - need for cues initially for 2-3 weeks to get Antonio Adams in the habit of using the calendar and list. Cognitive activities to do at home, use of schedule for am meds. Initiated training in HEP for dysarthria - Loud Ah averaged 80dB with frequent mod verbal cues, modeling. Oral reading conversational phrases averaged 68dB with usual mod modeling, verbal and gesture cues.   PATIENT EDUCATION: Education details: See Today's Treatment, Patient Instructions, compensations for cognition; HEP for dysarthria Person educated: Patient and Spouse Education method: Explanation, Demonstration, Verbal cues, and Handouts Education comprehension: verbal cues required and needs further education   GOALS: Goals reviewed with patient? Yes  SHORT TERM GOALS: Target date: 11/21/22  Pt will average 85dB on loud Ah with rare min A Baseline: Goal status: IN PROGRESS  2.  Pt will average 72dB on 18/20 10 word sentences with rare min A Baseline:  Goal status: IN PROGRESS  3.  Pt will use calendar and to do list to complete 2 household chores a day 5/7 days and recall appointments, social events with occasional min A from fiance Baseline:  Goal status: IN PROGRESS  4.  Pt will plan ahead 4 healthy meals for restaurants for 1 week using internet menus with occasional min A to target executive function and decision making Baseline:  Goal status: IN PROGRESS  5.  Pt will manage AM meds with external aids and occasional min A from fiance over 1 week Baseline:  Goal status: IN PROGRESS  6.  Pt will keep a schedule of getting out of bed upon waking for 2 hours before napping Baseline:  Goal status: IN PROGRESS  LONG TERM GOALS: Target date: 01/16/23  Pt will manage am meds with none missed with rare min A from family over 1 week Baseline:  Goal status: IN PROGRESS  2.  Pt will use calendar and to do list to be ready for appointments and complete daily household  tasks with rare min A from family  Baseline:  Goal status: IN PROGRESS  3.  Pt will average 70dB in structure speech tasks for 15 minutes with rare min A Baseline:  Goal status: IN PROGRESS  4.  Pt will average 70dB over 18 minute conversation with rare min A Baseline:  Goal status: IN PROGRESS  5.  Pt will require 2 or less requests for repetition when eating out in noisy environment Baseline:  Goal status: IN PROGRESS  6.  Pt will plan and attend 2 social events (men's breakfast, family event) using external aids to recall his ride and times Baseline:  Goal status: IN PROGRESS  ASSESSMENT:  CLINICAL IMPRESSION: Patient is a 77 y.o. male who was seen today for cognition and dysarthria. He is known to [place] from prior course of ST where he made good progress and was managing his meds, paying bills, managing his appointments and schedules. Since prior course, he has had a second CVA. His fiance Antonio Adams reports significant decline. Today he presents with moderate cognitive communication impairment and moderate hypokinetic dysarthria. Since 2nd CVA Antonio Adams is having to manage all medications and bills. She is having to stay at his house am through bed time due to cognitive impairments. She endorses his volume is soft and she frequently needs him to repeat. She notes that wait staff often ask him to repeat as well. At this time, Pear observes poor initiation. Prior to 2nd CVA Antonio Adams was fastidious with cleaning and organizing his home. He was doing his own laundry which she is now doing. He is not completing any chores. Wentworth wakes up, asks Pear his schedule and if he has nothing, he stays in bed and goes back to sleep until noon or 1:00. Antonio Adams is concerned that he is not taking his am meds until 1:00 so she has to go over, wake him and give him his meds. Antonio Adams verbalizes no awareness of his change In speech or his lack of initiation and poor sleep schedule. Antonio Adams and Antonio Adams both endorse that he  does not cook, but they eat out every day. This is concerning due to recurrent CVA.  After trials of exuberant voice therapy, he did note" I was in the [employer] and I would have to talk louder than this" I recommend skilled ST to maximize intelligibility and cognition for safety, independence and to reduce caregiver burden.   OBJECTIVE IMPAIRMENTS: include attention, memory, awareness, executive functioning, expressive language, and dysarthria. These impairments are limiting patient from managing medications, managing appointments, managing finances, household responsibilities, ADLs/IADLs, and effectively communicating at home and in community. Factors affecting potential to achieve goals and functional outcome are previous level of function.. Patient will benefit from skilled SLP services to address above impairments and improve overall function.  REHAB POTENTIAL: Good  PLAN:  SLP FREQUENCY: 2x/week  SLP DURATION: 12 weeks  PLANNED INTERVENTIONS: Language facilitation, Cueing hierachy, Cognitive reorganization, Internal/external aids, Oral motor exercises, Functional tasks, Multimodal communication approach, and SLP instruction and feedback    Alannah Averhart, Radene Journey, CCC-SLP 11/15/2022, 12:29 PM

## 2022-11-20 ENCOUNTER — Ambulatory Visit: Payer: Medicare Other | Admitting: Speech Pathology

## 2022-11-22 ENCOUNTER — Ambulatory Visit: Payer: Medicare Other | Admitting: Speech Pathology

## 2022-11-22 DIAGNOSIS — R471 Dysarthria and anarthria: Secondary | ICD-10-CM | POA: Diagnosis not present

## 2022-11-22 NOTE — Therapy (Signed)
OUTPATIENT SPEECH LANGUAGE PATHOLOGY TREATMENT   Patient Name: Antonio Adams MRN: [id number] DOB:16-Apr-1946, 77 y.o., male Today's Date: 11/22/2022  PCP: Etta Grandchild, MD REFERRING PROVIDER: Etta Grandchild, MD  END OF SESSION:  End of Session - 11/22/22 0947     Visit Number 7    Number of Visits 25    Date for SLP Re-Evaluation 01/16/23    Authorization Type [employer] Medicare    SLP Start Time 0947    SLP Stop Time  1015    SLP Time Calculation (min) 28 min             Past Medical History:  Diagnosis Date   ADENOCARCINOMA, PROSTATE, GLEASON GRADE 6 12/21/2009   prostate cancer   ALLERGIC RHINITIS 03/16/2007   Allergy    Anxiety    Arthritis    back    BACK PAIN WITH RADICULOPATHY 11/23/2008   Cancer of kidney (HCC)    partial right kidney removed   CARPAL TUNNEL SYNDROME, BILATERAL 03/16/2007   Chronic back pain    Constipation    takes Senokot daily   Depression    occasionally   DIABETES MELLITUS, TYPE II 03/16/2007   takes Januvia and MEtformin daily   GASTROINTESTINAL HEMORRHAGE, HX OF 03/16/2007   GERD 03/16/2007   takes Omeprazole daily   Glaucoma    mild - no eye drops   Hemorrhoids    History of colon polyps    HYPERLIPIDEMIA 03/16/2007   takes Crestor daily   HYPERTENSION 03/16/2007   takes Diltiazem and Lisinopril daily    Kidney cancer, primary, with metastasis from kidney to other site [facility])    LEG CRAMPS 05/22/2007   Nocturia    Overactive bladder    PARESTHESIA 05/21/2007   Proctitis    PTSD (post-traumatic stress disorder)    wakes up may be dreaming of fighting   Rectal bleeding    Dr.Norins has explained its from the Radiation that he has received   Rectal bleeding    Renal cell carcinoma (HCC)    Sleep apnea    uses CPAP nightly   Stomach cancer (HCC)    Urinary frequency    takes Toviaz daily   Past Surgical History:  Procedure Laterality Date   ADRENALECTOMY Right 02/26/2020   Procedure: OPEN RIGHT RENAL CELL METASTATECTOMY  AND  ADRENALECTOMY;  Surgeon: Crist Fat, MD;  Location: [facility];  Service: Urology;  Laterality: Right;   BACK SURGERY  1983, 2008   repeat surgery; ESI '08   CARPAL TUNNEL RELEASE     bilateral   CERVICAL FUSION     COLONOSCOPY     colonosocpy     ESOPHAGOGASTRODUODENOSCOPY     ED   FLEXIBLE SIGMOIDOSCOPY N/A 01/08/2017   Procedure: FLEXIBLE SIGMOIDOSCOPY;  Surgeon: Hilarie Fredrickson, MD;  Location: [facility] ENDOSCOPY;  Service: Endoscopy;  Laterality: N/A;   HOT HEMOSTASIS N/A 01/08/2017   Procedure: HOT HEMOSTASIS (ARGON PLASMA COAGULATION/BICAP);  Surgeon: Hilarie Fredrickson, MD;  Location: [facility] ENDOSCOPY;  Service: Endoscopy;  Laterality: N/A;   IR RADIOLOGIST EVAL & MGMT  12/17/2019   IR RADIOLOGIST EVAL & MGMT  07/01/2020   IR RADIOLOGIST EVAL & MGMT  08/25/2020   LUMBAR LAMINECTOMY  10/30/2011   Procedure: MICRODISCECTOMY LUMBAR LAMINECTOMY;  Surgeon: Kerrin Champagne, MD;  Location: [facility] OR;  Service: Orthopedics;  Laterality: Right;  Right L4-5 and L5-S1 Microdiscectomy   LUMBAR LAMINECTOMY  06/17/2012   Procedure: MICRODISCECTOMY LUMBAR LAMINECTOMY;  Surgeon: Fayrene Fearing  Lynne Logan, MD;  Location: [facility] OR;  Service: Orthopedics;  Laterality: N/A;  Right L5-S1 microdiscectomy   OPEN PARTIAL HEPATECTOMY  N/A 02/26/2020   Procedure: OPEN PARTIAL HEPATECTOMY, CHOLECYSTECTOMY;  Surgeon: Almond Lint, MD;  Location: [facility];  Service: General;  Laterality: N/A;   POLYPECTOMY     PROSTATE SURGERY  march 2012   seed implant   RADIOFREQUENCY ABLATION N/A 07/21/2020   Procedure: CT MICROWAVE ABLATION;  Surgeon: Sterling Big, MD;  Location: [facility];  Service: Anesthesiology;  Laterality: N/A;   ROBOTIC ASSITED PARTIAL NEPHRECTOMY Right 09/08/2015   Procedure: XI ROBOTIC ASSITED RIGHT PARTIAL NEPHRECTOMY;  Surgeon: Crist Fat, MD;  Location: [facility];  Service: Urology;  Laterality: Right;  Clamp on: 1033 Clamp off: 1103 Total Clamp Time: 30 minutes   SHOULDER ARTHROSCOPY WITH SUBACROMIAL DECOMPRESSION Left  01/20/2016   Procedure: LEFT SHOULDER ARTHROSCOPY WITH EXTENSIVE  DEBRIDEMENT, ACROMIOPLASTY;  Surgeon: Loreta Ave, MD;  Location: [facility];  Service: Orthopedics;  Laterality: Left;   SHOULDER SURGERY  LFT   Stress Cardiolite  12/24/2003   negative for ischemia   UPPER ESOPHAGEAL ENDOSCOPIC ULTRASOUND (EUS)  04/18/2016   [facility]   UPPER GASTROINTESTINAL ENDOSCOPY     WOUND EXPLORATION     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   Patient Active Problem List   Diagnosis Date Noted   Encounter for palliative care involving management of pain 11/02/2022   Encounter for general adult medical examination with abnormal findings 01/23/2022   Anemia due to zinc deficiency 12/20/2021   Symptomatic anemia 12/15/2021   Cancer related pain 12/15/2021   Chronic heart failure with preserved ejection fraction (HFpEF) (HCC) 08/12/2021   Tinea cruris 08/12/2021   Anemia due to chronic kidney disease 07/20/2021   CKD (chronic kidney disease) stage 5, GFR less than 15 ml/min (HCC) 05/24/2021   BPH (benign prostatic hyperplasia) 05/24/2021   Senile nuclear sclerosis 05/20/2021   Vitamin D deficiency 05/20/2021   Vitreous degeneration, unspecified eye 05/20/2021   Intrinsic eczema 04/18/2021   Atherosclerosis of aorta (HCC) 12/30/2020   Mixed conductive and sensorineural hearing loss of left ear with restricted hearing of right ear 09/01/2020   Metastatic renal cell carcinoma (HCC) 02/26/2020   Diabetic polyneuropathy associated with type 2 diabetes mellitus (HCC) 12/18/2019   Murmur, cardiac 10/08/2018   DDD (degenerative disc disease), cervical 10/08/2018   OAB (overactive bladder) 04/04/2018   Seasonal allergic rhinitis due to pollen 11/01/2016   Spinal stenosis of lumbar region with radiculopathy 10/30/2011    Class: Chronic   Obstructive sleep apnea 11/24/2010   Cognitive impairment 11/24/2010   ADENOCARCINOMA, PROSTATE, GLEASON GRADE 6 12/21/2009    Essential hypertension 03/16/2007   GERD 03/16/2007    ONSET DATE: 08/24/22   REFERRING DIAG:  THERAPY DIAG:  I63.9 (ICD-10-CM) - Acute ischemic stroke (HCC)  Rationale for Evaluation and Treatment: Rehabilitation  SUBJECTIVE:   SUBJECTIVE STATEMENT: "His son came and he spoke more"--fiance.  Pt accompanied by:  fiance  PERTINENT HISTORY: Pt is a 77 y.o. male who presents with episode of confusion/hallucinations. MRI (08/24/22) revealed "Acute left parieto-occipital periventricular infarcts in the region of prior infarct". SLE after previous CVA (04/09/22) noted cognitive impairment (memory/problem solving) with pt receiving OP SLP services post discharge. PMH: GERD, prior L PCA stroke, metastatic RCC, prostate cancer, stage V CKD declining dialysis.   PAIN:  Are you having pain? No  FALLS: Has patient fallen in last 6 months?  See PT evaluation for details  LIVING ENVIRONMENT: Lives with: lives alone - fiance staying with him during the day until bedtime Lives in: House/apartment  PLOF:  Level of assistance: Independent with ADLs, Needed assistance with IADLS Employment: Retired  PATIENT GOALS: To manage AM meds, calendar, to get back to doing things" fiance  OBJECTIVE:   DIAGNOSTIC FINDINGS: Acute left parieto-occipital periventricular infarcts in the region of prior infarct.  COGNITION: Overall cognitive status: Impaired and Decline since CVA and course of ST in Feb 2024 Areas of impairment:  Attention: Impaired: Sustained, Selective, Alternating Memory: Impaired: Immediate Working Short term Engineering geologist Awareness: Impaired: Intellectual Executive function: Impaired: Initiation, Problem solving, Organization, Planning, Self-correction, and Slow processing Behavior: WNL Functional deficits: meds, bills, household chores, humor  COGNITIVE COMMUNICATION: Following directions: Follows one step commands consistently  Auditory comprehension:  Impaired: due to slow processing Verbal expression: Impaired: word finding difficulty noted in naming restaurants he frequents Functional communication: WFL  ORAL MOTOR EXAMINATION: Overall status: WFL Comments:   STANDARDIZED ASSESSMENTS: N/A - moderate impairments on CLQT prior eval   MOTOR SPEECH:  Volume: Averaged 54dB in conversation Low vocal intensity Voice and Resonance WNL Motor Planning WNL Intelligibility reduced at sentence level  Subjective: Pain 8/10 - back, did not take pain meds or tylenol "He didn't tell me about this until now" TODAY'S TREATMENT:                                                                                                                                         DATE:   11/22/22: SLP consulted wife about putting palatable care in place, and wife states that they are aware, and have something planned. Patient was not feeling well and had difficulty getting out of bed and coming to the appointment. Patient states that days are up and down. Sustained "ah" averaged at 88 dB. Reading average 72 dB with frequent mod-A of visual cues to increase volume. Conversational speech averaged at 66 dB. Patient endorsed that volume decreased during cognitive activities. Discussion led on continuation of ST with additional medical events going on. Patient states that speech is doing better and that he is able to communicate with unfamiliar listeners on the phone. Patient states that he has no concerns with chores/responsibilities around the house. SLP recommends discharge at this time due to patient outcomes. Patient and fiance both endorse discharge at this time, and all questions answered to patient satisfaction.   7/3/24Leonette Most has 8/10 back pain today with flat affect and low volume. Due to pain, limited volume addressed. He has gotten up and out of bed earlier the past 2 days. He is recalling his am meds more consistently with external aids. Fiance is there for  supervision aid. Targeted volume generating similarities and differences for related objects Leonette Most required frequent verbal cues, modeling to achieve 70dB 8/12 sentences. Kennth continues to have impaired vision -  we generated strategy of obtaining a larger calendar in his kitchen. They will f/u with this before next session.   11/09/22: SLP led patient through procedural tasks with sequence of tasks into 4-6 steps, and read through the procedures with low volume. Patient required mod-A of reminders to speak up from both SLP and spouse. SLP led through cognitive flexibility task, and patient required min-A of guiding questions, and semantic cues to determine different options needed. Patient rated voice today at a 5 on 10 point scale, which matched SLP perception. Education provided on relying on feedback of communication partners to increase volume.   11/07/22: Pt states that schedule and medication is making slow progress. There was a death in the family, and pt demonstrated a low, unmotivated mood due to hard news in the family. Pt's voice was at a low volume today during unstructured conversation and was able to increase when he was passionate about the topic. Pt benefited from mod-A models, gestural cues, and prompts to increase volume. Patient said that he will increase HEP in the coming weeks. Reviewed previously generated schedule and encouraged pt to begin tomorrow, as he has not yet.   11/01/22: Pt enters room responding at 60dB - HEP for dysarthria with usual mod modeling, gestures and verbal cues Kanen averaged 80dB on loud Ah! Repeating conversational phrases with mod A he averaged 68dB. In structured task generating 3 sentence descriptions  of basic objects, Holmes required frequent mod verbal cues, modeling and gestures to achieve 68dB (65 was average). Conversation re: items In between descriptions also required frequent mod to max A to achieve 65dB. We generated a schedule of getting out  of bed at 9:00, going to kitchen, taking meds, eating a snack and looking at his calendar before going back to sleep to facilitated independence with am meds. Renner generated schedule of waking up again at 11:00 and getting showered and dressed then having lunch (see patient instructions) ongoing education re: benefits of a consistent sleep/wake  and daily schedule in building stamina and endurance post stroke.   10/27/22: Patient averaged 65 dB with occasional min-A reminder to speak louder and visual feedback by reading the audio reader. Vocal volume decreased during unstructured conversation, and patient was unable to carry loud voice over without prompting. Calendar drafted of two household chores of vacuuming and washing clothes. Targeted voice through parkinson's voice project to lead through structured and unstructured conversation.   10/24/22 (eval day): Initiated training in compensations for memory, attention, awareness including reinstating calendar and to do list - need for cues initially for 2-3 weeks to get Chey in the habit of using the calendar and list. Cognitive activities to do at home, use of schedule for am meds. Initiated training in HEP for dysarthria - Loud Ah averaged 80dB with frequent mod verbal cues, modeling. Oral reading conversational phrases averaged 68dB with usual mod modeling, verbal and gesture cues.   PATIENT EDUCATION: Education details: See Today's Treatment, Patient Instructions, compensations for cognition; HEP for dysarthria Person educated: Patient and Spouse Education method: Explanation, Demonstration, Verbal cues, and Handouts Education comprehension: verbal cues required and needs further education   GOALS: Goals reviewed with patient? Yes  SHORT TERM GOALS: Target date: 11/21/22  Pt will average 85dB on loud Ah with rare min A Baseline: Goal status: MET  2.  Pt will average 72dB on 18/20 10 word sentences with rare min A Baseline:  Goal  status: MET  3.  Pt will use calendar and to do  list to complete 2 household chores a day 5/7 days and recall appointments, social events with occasional min A from fiance Baseline:  Goal status: MET  4.  Pt will plan ahead 4 healthy meals for restaurants for 1 week using internet menus with occasional min A to target executive function and decision making Baseline:  Goal status: REVISED--deferred.   5.  Pt will manage AM meds with external aids and occasional min A from fiance over 1 week Baseline:  Goal status: MET  6.  Pt will keep a schedule of getting out of bed upon waking for 2 hours before napping Baseline:  Goal status: MET  LONG TERM GOALS: Target date: 01/16/23  Pt will manage am meds with none missed with rare min A from family over 1 week Baseline:  Goal status: MET  2.  Pt will use calendar and to do list to be ready for appointments and complete daily household tasks with rare min A from family  Baseline:  Goal status: MET  3.  Pt will average 70dB in structure speech tasks for 15 minutes with rare min A Baseline:  Goal status: MET  4.  Pt will average 70dB over 18 minute conversation with rare min A Baseline:  Goal status: MET  5.  Pt will require 2 or less requests for repetition when eating out in noisy environment Baseline:  Goal status: MET  6.  Pt will plan and attend 2 social events (men's breakfast, family event) using external aids to recall his ride and times Baseline:  Goal status: MET  ASSESSMENT:  CLINICAL IMPRESSION: Patient is a 77 y.o. male who was seen today for cognition and dysarthria. He is known to [place] from prior course of ST where he made good progress and was managing his meds, paying bills, managing his appointments and schedules. Since prior course, he has had a second CVA. His fiance [name] reports significant decline. Today he presents with moderate cognitive communication impairment and moderate hypokinetic dysarthria. Since 2nd  CVA Carolan Clines is having to manage all medications and bills. She is having to stay at his house am through bed time due to cognitive impairments. She endorses his volume is soft and she frequently needs him to repeat. She notes that wait staff often ask him to repeat as well. At this time, Pear observes poor initiation. Prior to 2nd CVA Katelyn was fastidious with cleaning and organizing his home. He was doing his own laundry which she is now doing. He is not completing any chores. Landers wakes up, asks Pear his schedule and if he has nothing, he stays in bed and goes back to sleep until noon or 1:00. Carolan Clines is concerned that he is not taking his am meds until 1:00 so she has to go over, wake him and give him his meds. Muneer verbalizes no awareness of his change In speech or his lack of initiation and poor sleep schedule. Leonette Most and [place] both endorse that he does not cook, but they eat out every day. This is concerning due to recurrent CVA.  After trials of exuberant voice therapy, he did note" I was in the [employer] and I would have to talk louder than this" I recommend skilled ST to maximize intelligibility and cognition for safety, independence and to reduce caregiver burden.   OBJECTIVE IMPAIRMENTS: include attention, memory, awareness, executive functioning, expressive language, and dysarthria. These impairments are limiting patient from managing medications, managing appointments, managing finances, household responsibilities, ADLs/IADLs, and effectively communicating at home  and in community. Factors affecting potential to achieve goals and functional outcome are previous level of function.. Patient will benefit from skilled SLP services to address above impairments and improve overall function.  REHAB POTENTIAL: Good  PLAN:  SLP FREQUENCY: 2x/week  SLP DURATION: 12 weeks  PLANNED INTERVENTIONS: Language facilitation, Cueing hierachy, Cognitive reorganization, Internal/external aids, Oral motor  exercises, Functional tasks, Multimodal communication approach, and SLP instruction and feedback  SPEECH THERAPY DISCHARGE SUMMARY  Visits from Start of Care: 7  Current functional level related to goals / functional outcomes: Patient met all goals with mod-I. He benefits from reminders to speak up.    Remaining deficits: Low vocal volume.    Education / Equipment: Surveyor, quantity, Economist, Cognitive reorganization, Internal/external aids, Oral motor exercises, Functional tasks, Multimodal communication approach, and SLP instruction and feedback   Patient agrees to discharge. Patient goals were met. Patient is being discharged due to being pleased with the current functional level.Wille Celeste Sanuel Ladnier, Student-SLP 11/22/2022, 9:47 AM

## 2022-11-27 ENCOUNTER — Encounter (HOSPITAL_COMMUNITY)
Admission: RE | Admit: 2022-11-27 | Discharge: 2022-11-27 | Disposition: A | Discharge status: Home / Self Care | Payer: Medicare Other | Source: Ambulatory Visit | Attending: Nephrology | Admitting: Nephrology

## 2022-11-27 ENCOUNTER — Ambulatory Visit: Payer: Medicare Other | Admitting: Speech Pathology

## 2022-11-27 ENCOUNTER — Other Ambulatory Visit (HOSPITAL_BASED_OUTPATIENT_CLINIC_OR_DEPARTMENT_OTHER): Payer: Self-pay | Admitting: Family

## 2022-11-27 VITALS — BP 144/81 | HR 94 | Temp 97.3°F | Resp 17

## 2022-11-27 DIAGNOSIS — N185 Chronic kidney disease, stage 5: Secondary | ICD-10-CM | POA: Insufficient documentation

## 2022-11-27 DIAGNOSIS — I10 Essential (primary) hypertension: Secondary | ICD-10-CM

## 2022-11-27 LAB — FERRITIN: Ferritin: 371 ng/mL — ABNORMAL HIGH (ref 24–336)

## 2022-11-27 LAB — IRON AND TIBC
Iron: 48 ug/dL (ref 45–182)
Saturation Ratios: 20 % (ref 17.9–39.5)
TIBC: 245 ug/dL — ABNORMAL LOW (ref 250–450)
UIBC: 197 ug/dL

## 2022-11-27 LAB — POCT HEMOGLOBIN-HEMACUE: Hemoglobin: 10.1 g/dL — ABNORMAL LOW (ref 13.0–17.0)

## 2022-11-27 MED ORDER — EPOETIN ALFA-EPBX 40000 UNIT/ML IJ SOLN
INTRAMUSCULAR | Status: AC
  Filled 2022-11-27: qty 1

## 2022-11-27 MED ORDER — EPOETIN ALFA-EPBX 40000 UNIT/ML IJ SOLN
30000.0000 [IU] | INTRAMUSCULAR | Status: DC
  Administered 2022-11-27: 30000 [IU] via SUBCUTANEOUS

## 2022-11-27 NOTE — Telephone Encounter (Signed)
Rx(s) sent to pharmacy electronically.  

## 2022-11-29 ENCOUNTER — Ambulatory Visit: Payer: Medicare Other | Admitting: Speech Pathology

## 2022-11-30 DIAGNOSIS — N185 Chronic kidney disease, stage 5: Secondary | ICD-10-CM | POA: Diagnosis not present

## 2022-11-30 DIAGNOSIS — I120 Hypertensive chronic kidney disease with stage 5 chronic kidney disease or end stage renal disease: Secondary | ICD-10-CM | POA: Diagnosis not present

## 2022-11-30 DIAGNOSIS — I639 Cerebral infarction, unspecified: Secondary | ICD-10-CM | POA: Diagnosis not present

## 2022-11-30 DIAGNOSIS — E1122 Type 2 diabetes mellitus with diabetic chronic kidney disease: Secondary | ICD-10-CM | POA: Diagnosis not present

## 2022-11-30 DIAGNOSIS — E785 Hyperlipidemia, unspecified: Secondary | ICD-10-CM | POA: Diagnosis not present

## 2022-11-30 DIAGNOSIS — E875 Hyperkalemia: Secondary | ICD-10-CM | POA: Diagnosis not present

## 2022-11-30 DIAGNOSIS — D631 Anemia in chronic kidney disease: Secondary | ICD-10-CM | POA: Diagnosis not present

## 2022-11-30 DIAGNOSIS — N2581 Secondary hyperparathyroidism of renal origin: Secondary | ICD-10-CM | POA: Diagnosis not present

## 2022-11-30 DIAGNOSIS — C649 Malignant neoplasm of unspecified kidney, except renal pelvis: Secondary | ICD-10-CM | POA: Diagnosis not present

## 2022-12-04 ENCOUNTER — Ambulatory Visit: Payer: Medicare Other | Admitting: Speech Pathology

## 2022-12-04 LAB — LAB REPORT - SCANNED: EGFR: 5

## 2022-12-06 ENCOUNTER — Encounter: Payer: Medicare Other | Admitting: Speech Pathology

## 2022-12-06 ENCOUNTER — Other Ambulatory Visit: Payer: Self-pay | Admitting: Internal Medicine

## 2022-12-11 ENCOUNTER — Encounter (HOSPITAL_COMMUNITY)
Admission: RE | Admit: 2022-12-11 | Discharge: 2022-12-11 | Disposition: A | Discharge status: Home / Self Care | Payer: Medicare Other | Source: Ambulatory Visit | Attending: Nephrology | Admitting: Nephrology

## 2022-12-11 ENCOUNTER — Encounter: Payer: Medicare Other | Admitting: Speech Pathology

## 2022-12-11 ENCOUNTER — Encounter (HOSPITAL_BASED_OUTPATIENT_CLINIC_OR_DEPARTMENT_OTHER): Payer: Self-pay | Admitting: Cardiology

## 2022-12-11 ENCOUNTER — Ambulatory Visit (HOSPITAL_BASED_OUTPATIENT_CLINIC_OR_DEPARTMENT_OTHER): Payer: Medicare Other | Admitting: Cardiology

## 2022-12-11 VITALS — BP 152/86 | HR 62 | Ht 75.0 in | Wt 200.2 lb

## 2022-12-11 VITALS — BP 150/87 | HR 66 | Temp 97.3°F | Resp 17

## 2022-12-11 DIAGNOSIS — N185 Chronic kidney disease, stage 5: Secondary | ICD-10-CM | POA: Diagnosis not present

## 2022-12-11 DIAGNOSIS — I2510 Atherosclerotic heart disease of native coronary artery without angina pectoris: Secondary | ICD-10-CM | POA: Diagnosis not present

## 2022-12-11 DIAGNOSIS — Z8673 Personal history of transient ischemic attack (TIA), and cerebral infarction without residual deficits: Secondary | ICD-10-CM

## 2022-12-11 DIAGNOSIS — E785 Hyperlipidemia, unspecified: Secondary | ICD-10-CM | POA: Diagnosis not present

## 2022-12-11 DIAGNOSIS — I5032 Chronic diastolic (congestive) heart failure: Secondary | ICD-10-CM

## 2022-12-11 DIAGNOSIS — I10 Essential (primary) hypertension: Secondary | ICD-10-CM | POA: Diagnosis not present

## 2022-12-11 LAB — POCT HEMOGLOBIN-HEMACUE: Hemoglobin: 10.6 g/dL — ABNORMAL LOW (ref 13.0–17.0)

## 2022-12-11 MED ORDER — EPOETIN ALFA-EPBX 40000 UNIT/ML IJ SOLN: 30000.0000 [IU] | INTRAMUSCULAR | Status: DC

## 2022-12-11 MED ORDER — EPOETIN ALFA-EPBX 40000 UNIT/ML IJ SOLN
INTRAMUSCULAR | Status: AC
  Administered 2022-12-11: 30000 [IU] via SUBCUTANEOUS
  Filled 2022-12-11: qty 1

## 2022-12-11 NOTE — Patient Instructions (Addendum)
We are going to monitor your blood pressure at home. Please keep a log a send it to [place] by mychart in a few weeks.  how to check blood pressure:  -sit comfortably in a chair, feet uncrossed and flat on floor, for 5-10 minutes  -arm ideally should rest at the level of the heart. However, arm should be relaxed and not tense (for example, do not hold the arm up unsupported)  -avoid exercise, caffeine, and tobacco for at least 30 minutes prior to BP reading  -don't take BP cuff reading over clothes (always place on skin directly)  -I prefer to know how well the medication is working, so I would like you to take your readings 1-2 hours after taking your blood pressure medication if possible   FOLLOW UP WITH CAITLIN W NP IN 3 MONTHS  FOLLOW UP WITH DR Cristal Deer MID TO END OF DECEMBER

## 2022-12-11 NOTE — Progress Notes (Signed)
Cardiology Office Note:  .    Date:  12/11/2022  ID:  Antonio Adams, DOB 18-Sep-1945, MRN [id number] PCP: Etta Grandchild, MD  [facility] Providers Cardiologist:  Jodelle Red, MD     History of Present Illness: .    Antonio Adams is a 77 y.o. male with a hx of CKD stage 5, CVA, hypertension, hyperlipidemia, anemia, who is seen for follow-up.   CV history: He was admitted to the hospital 01/07/2022. He reported that for the last two weeks he developed increasing exertional dyspnea. He was found to be profoundly anemic. His symptoms resolved with transfusion, his anemia appeared to be acute on chronic secondary to advanced chronic kidney disease. Troponins were elevated in the setting of CKD5 and stable. Echo performed was unremarkable with an EF of 60 to 65% and grade 1 diastolic dysfunction. He was discharged and recommended PCP and nephrology follow up.   At his visit 08/07/2022, blood pressure was very elevated initially. He complained of rare headaches, no blurry vision. Swelling in legs was stable. Had seen nephrology and was told to stop taking Megace given risk of blood clots. Completed course of clopidogrel in February, now on aspirin alone.   He was admitted to the hospital 08/24/2022 with complaints of "spinning" and confusion. MRI brain with acute left PCA CVA. MRA, carotid US, TTE unremarkable. Neurology recommended aspirin and plavix x 3 weeks then plavix alone. Crestor changed to Atorvastatin. He followed up with Gillian Shields, NP 09/01/2022 and complained of poor appetite. Home blood pressures were 160s-170s/70s-80s. Initiated Imdur 60 mg daily.   On 10/30/2022 he presented to the ED following a sudden episode of weakness and nausea in the setting of a recent Retacrit injection at the infusion center. Initial blood pressure was 109/70. EKG with no acute findings. Troponin 94 and flat. His symptoms spontaneously resolved and blood pressure normalized. No  intervention required.  Today, he is accompanied by his wife. He is feeling okay today. No recent chest pains or heaviness. Breathing has been stable. No passing out or blacking out. Since his hospitalization he has been feeling better overall, but not yet back to baseline. He has been exercising with a treadmill at home.   He occasionally checks his home blood pressure, not very often. Usually better at home, his wife believes that his blood pressure elevates after eating certain foods. This morning he did have some bacon. In the office today his blood pressure is 171/86 initially, and 152/86 on manual recheck.   He denies feeling like he is retaining fluid. Notes that he has urinary urgency on Lasix.  Lately he has not been sleeping well, difficulty with falling asleep and staying asleep at night. Typically he will sleep late into the mornings. He is not too concerned as this has been an ongoing issue.   He denies any palpitations, lightheadedness, headaches, syncope, orthopnea, or PND.  ROS:  Please see the history of present illness. ROS otherwise negative except as noted.  (+) Insomnia  Studies Reviewed: Marland Kitchen         Bilateral Carotid Dopplers  08/25/2022: Summary:  Right Carotid: Velocities in the right ICA are consistent with a 1-39% stenosis.   Left Carotid: Velocities in the left ICA are consistent with a 1-39% stenosis.   Vertebrals: Bilateral vertebral arteries demonstrate antegrade flow.  Subclavians: Normal flow hemodynamics were seen in bilateral subclavian arteries.   Echocardiogram  08/25/2022:  1. Left ventricular ejection fraction, by estimation, is 50 to  55%. The  left ventricle has low normal function. The left ventricle has no regional  wall motion abnormalities. Left ventricular diastolic parameters are  consistent with Grade I diastolic  dysfunction (impaired relaxation).   2. Right ventricular systolic function is normal. The right ventricular  size is normal.  Tricuspid regurgitation signal is inadequate for assessing  PA pressure.   3. Left atrial size was mild to moderately dilated.   4. No evidence of mitral valve regurgitation.   5. The aortic valve was not well visualized. Aortic valve regurgitation  is not visualized.   6. There is mild dilatation of the aortic root, measuring 40 mm.   7. The inferior vena cava is dilated in size with >50% respiratory  variability, suggesting right atrial pressure of 8 mmHg.   Physical Exam:    VS:  BP (!) 152/86 (BP Location: Right Arm, Patient Position: Sitting, Cuff Size: Normal)   Pulse 62   Ht 6\' 3"  (1.905 m)   Wt 200 lb 3.2 oz (90.8 kg)   SpO2 100%   BMI 25.02 kg/m    Wt Readings from Last 3 Encounters:  12/11/22 200 lb 3.2 oz (90.8 kg)  11/08/22 194 lb (88 kg)  11/02/22 199 lb 8 oz (90.5 kg)    GEN: Well nourished, well developed in no acute distress HEENT: Normal, moist mucous membranes NECK: No JVD CARDIAC: regular rhythm, normal S1 and S2, no rubs or gallops. 2/6 aortic flow murmur. VASCULAR: Radial and DP pulses 2+ bilaterally. No carotid bruits RESPIRATORY:  Clear to auscultation without rales, wheezing or rhonchi  ABDOMEN: Soft, non-tender, non-distended MUSCULOSKELETAL:  Ambulates independently SKIN: Warm and dry, no edema NEUROLOGIC:  Alert and oriented x 3. No focal neuro deficits noted. PSYCHIATRIC:  Normal affect   ASSESSMENT AND PLAN: .    Low normal LVEF Hypertension Chronic kidney disease, stage 5 (last GFR 5) -appears largely euvolemic. Making urine on lasix. -EF 50-55%, low normal. He was started on metoprolol BID. We changed to carvedilol, but he is back on metoprolol (unclear why it was changed back) -given CKD 5, no ability for ACEi/ARB/ARNI/MRA -continue imdur -see prior notes, he has confirmed that he does not want dialysis, avoid any nephrotoxic agents   Will have him check home BP, message me with numbers in a few weeks. With recent low BP, do not want to  over treat, but with his severe CKD need to make sure his BP is well controlled  CVA Coronary calcification on CT -continue atorvastatin -now on clopidogrel given recent stroke   Cardiac risk counseling and prevention recommendations: -recommend heart healthy/Mediterranean diet, with whole grains, fruits, vegetable, fish, lean meats, nuts, and olive oil. Limit salt. -recommend moderate walking, 3-5 times/week for 30-50 minutes each session. Aim for at least 150 minutes.week. Goal should be pace of 3 miles/hours, or walking 1.5 miles in 30 minutes -recommend avoidance of tobacco products. Avoid excess alcohol. -ASCVD risk score: The ASCVD Risk score (Arnett DK, et al., 2019) failed to calculate for the following reasons:   The patient has a prior MI or stroke diagnosis    Dispo: Follow-up with APP in 3 months. Follow-up with me in 6 months, or sooner as needed.  I,Mathew Stumpf,acting as a Neurosurgeon for [employer], MD.,have documented all relevant documentation on the behalf of Jodelle Red, MD,as directed by  Jodelle Red, MD while in the presence of Jodelle Red, MD.  I, Jodelle Red, MD, have reviewed all documentation for this visit. The documentation  on 12/11/22 for the exam, diagnosis, procedures, and orders are all accurate and complete.   Signed, Jodelle Red, MD

## 2022-12-13 ENCOUNTER — Encounter (INDEPENDENT_AMBULATORY_CARE_PROVIDER_SITE_OTHER): Payer: Self-pay

## 2022-12-13 ENCOUNTER — Encounter: Payer: Medicare Other | Admitting: Speech Pathology

## 2022-12-25 ENCOUNTER — Encounter (HOSPITAL_BASED_OUTPATIENT_CLINIC_OR_DEPARTMENT_OTHER): Payer: Self-pay | Admitting: Emergency Medicine

## 2022-12-25 ENCOUNTER — Encounter (HOSPITAL_COMMUNITY)
Admission: RE | Admit: 2022-12-25 | Discharge: 2022-12-25 | Disposition: A | Discharge status: Home / Self Care | Payer: Medicare Other | Source: Ambulatory Visit | Attending: Nephrology | Admitting: Nephrology

## 2022-12-25 VITALS — BP 165/97 | HR 62 | Temp 97.5°F | Resp 17

## 2022-12-25 DIAGNOSIS — J029 Acute pharyngitis, unspecified: Secondary | ICD-10-CM | POA: Insufficient documentation

## 2022-12-25 DIAGNOSIS — D631 Anemia in chronic kidney disease: Secondary | ICD-10-CM | POA: Insufficient documentation

## 2022-12-25 DIAGNOSIS — N185 Chronic kidney disease, stage 5: Secondary | ICD-10-CM | POA: Insufficient documentation

## 2022-12-25 DIAGNOSIS — Z20822 Contact with and (suspected) exposure to covid-19: Secondary | ICD-10-CM | POA: Diagnosis not present

## 2022-12-25 DIAGNOSIS — Z7902 Long term (current) use of antithrombotics/antiplatelets: Secondary | ICD-10-CM | POA: Diagnosis not present

## 2022-12-25 LAB — IRON AND TIBC
Iron: 52 ug/dL (ref 45–182)
Saturation Ratios: 20 % (ref 17.9–39.5)
TIBC: 258 ug/dL (ref 250–450)
UIBC: 206 ug/dL

## 2022-12-25 LAB — POCT HEMOGLOBIN-HEMACUE: Hemoglobin: 10.9 g/dL — ABNORMAL LOW (ref 13.0–17.0)

## 2022-12-25 LAB — SARS CORONAVIRUS 2 BY RT PCR: SARS Coronavirus 2 by RT PCR: NEGATIVE

## 2022-12-25 LAB — FERRITIN: Ferritin: 372 ng/mL — ABNORMAL HIGH (ref 24–336)

## 2022-12-25 LAB — GROUP A STREP BY PCR: Group A Strep by PCR: NOT DETECTED

## 2022-12-25 MED ORDER — EPOETIN ALFA-EPBX 40000 UNIT/ML IJ SOLN: 30000.0000 [IU] | INTRAMUSCULAR | Status: DC

## 2022-12-25 MED ORDER — EPOETIN ALFA-EPBX 40000 UNIT/ML IJ SOLN
INTRAMUSCULAR | Status: AC
  Administered 2022-12-25: 30000 [IU] via SUBCUTANEOUS
  Filled 2022-12-25: qty 1

## 2022-12-25 NOTE — ED Triage Notes (Signed)
Pt c/o sore throat x 3 hours.

## 2022-12-26 ENCOUNTER — Emergency Department (HOSPITAL_BASED_OUTPATIENT_CLINIC_OR_DEPARTMENT_OTHER)
Admission: EM | Admit: 2022-12-26 | Discharge: 2022-12-26 | Disposition: A | Discharge status: Home / Self Care | Payer: Medicare Other | Attending: Emergency Medicine | Admitting: Emergency Medicine

## 2022-12-26 DIAGNOSIS — J029 Acute pharyngitis, unspecified: Secondary | ICD-10-CM

## 2022-12-26 LAB — CBG MONITORING, ED
Glucose-Capillary: 56 mg/dL — ABNORMAL LOW (ref 70–99)
Glucose-Capillary: 86 mg/dL (ref 70–99)

## 2022-12-26 NOTE — ED Notes (Signed)
Cbg 56. Pt given 2 cups of orange juice and peanut butter and graham crackers.

## 2022-12-26 NOTE — ED Provider Notes (Signed)
[place] EMERGENCY DEPARTMENT AT [facility] Provider Note   CSN: [id number] Arrival date & time: 12/25/22  2131     History  Chief Complaint  Patient presents with   Sore Throat    Antonio Adams is a 77 y.o. male.   Sore Throat     77 year old male presenting to the Emergency Department with a roughly 3 hours of sore throat.  The patient denies any fevers, chills or any other complaints.  He denies any cough, chest pain, shortness of breath, ear pain, abdominal pain.  No known sick contacts.  He is tolerating oral intake.  No unilateral nature to the swelling.  Home Medications Prior to Admission medications   Medication Sig Start Date End Date Taking? Authorizing Provider  acetaminophen (TYLENOL) 500 MG tablet Take 1,000 mg by mouth every 6 (six) hours as needed for mild pain.    [provider]  atorvastatin (LIPITOR) 80 MG tablet Take 1 tablet (80 mg total) by mouth daily. 08/26/22   Almon Hercules, MD  calcitRIOL (ROCALTROL) 0.25 MCG capsule Take 0.25 mcg by mouth daily. 01/05/22   [provider]  clopidogrel (PLAVIX) 75 MG tablet Take 1 tablet (75 mg total) by mouth daily. 08/30/22   Marcos Eke, PA-C  Cyanocobalamin (B-12 PO) Take 1,000 mcg by mouth daily.    [provider]  cyclobenzaprine (FLEXERIL) 5 MG tablet Take 1 tablet (5 mg total) by mouth 2 (two) times daily as needed for muscle spasms. 03/19/22   Blane Ohara, MD  donepezil (ARICEPT) 10 MG tablet Take half tablet (5 mg) daily for 2 weeks, then one tablet daily. 10/18/22   Marcos Eke, PA-C  DULoxetine (CYMBALTA) 20 MG capsule TAKE 1 CAPSULE BY MOUTH EVERY DAY 12/06/22   Etta Grandchild, MD  ferrous sulfate 325 (65 FE) MG tablet Take 1 tablet (325 mg total) by mouth 2 (two) times daily. Patient needs office visit for further refills Patient taking differently: Take 325 mg by mouth daily with breakfast. 02/24/19   Hilarie Fredrickson, MD  fluocinonide-emollient (LIDEX-E)  0.05 % cream Apply 1 Application topically 2 (two) times daily. 08/30/22   Etta Grandchild, MD  furosemide (LASIX) 40 MG tablet TAKE 1 TABLET BY MOUTH EVERY DAY 06/22/22   Etta Grandchild, MD  isosorbide mononitrate (IMDUR) 60 MG 24 hr tablet TAKE 1 TABLET BY MOUTH EVERY DAY 11/27/22   Alver Sorrow, NP  metoprolol tartrate (LOPRESSOR) 25 MG tablet TAKE 1 TABLET BY MOUTH TWICE A DAY 06/22/22   Jodelle Red, MD  mirtazapine (REMERON SOL-TAB) 15 MG disintegrating tablet Take 15 mg by mouth at bedtime.    [provider]  oxyCODONE ER (XTAMPZA ER) 9 MG C12A Take 1 capsule by mouth 2 (two) times daily as needed. 11/08/22   Etta Grandchild, MD  pantoprazole (PROTONIX) 20 MG tablet Take 1 tablet (20 mg total) by mouth daily. Patient taking differently: Take 20 mg by mouth daily as needed for heartburn or indigestion. 01/08/22   Azucena Fallen, MD  polyvinyl alcohol (LIQUIFILM TEARS) 1.4 % ophthalmic solution Place 1 drop into both eyes daily as needed for dry eyes.    [provider]  sodium bicarbonate 650 MG tablet Take 2 tablets (1,300 mg total) by mouth 2 (two) times daily. 04/09/22   Ghimire, Werner Lean, MD  solifenacin (VESICARE) 5 MG tablet TAKE 1 TABLET (5 MG TOTAL) BY MOUTH DAILY. 10/02/22   Etta Grandchild, MD  tamsulosin (FLOMAX) 0.4 MG CAPS capsule TAKE 1 CAPSULE (0.4 MG TOTAL) BY MOUTH DAILY. Patient taking differently: Take 0.4 mg by mouth daily. 01/20/14   Etta Grandchild, MD  traMADol (ULTRAM) 50 MG tablet Take 1 tablet (50 mg total) by mouth every 12 (twelve) hours as needed. 09/19/22   Cristie Hem, PA-C  zinc gluconate 50 MG tablet Take 1 tablet (50 mg total) by mouth daily. 08/30/22   Etta Grandchild, MD      Allergies    Ace inhibitors, Gabapentin, Xyzal [levocetirizine], and Opdivo [nivolumab]    Review of Systems   Review of Systems  HENT:  Positive for sore throat.   All other systems reviewed and are negative.   Physical Exam Updated Vital  Signs BP (!) 184/96 (BP Location: Left Arm)   Pulse 63   Temp 97.9 F (36.6 C)   Resp (!) 21   Ht 6\' 3"  (1.905 m)   Wt 90.8 kg   SpO2 98%   BMI 25.02 kg/m  Physical Exam Vitals and nursing note reviewed.  Constitutional:      General: He is not in acute distress. HENT:     Head: Normocephalic and atraumatic.     Comments: Mild oropharyngeal erythema, no peritonsillar swelling, uvula midline Eyes:     Conjunctiva/sclera: Conjunctivae normal.     Pupils: Pupils are equal, round, and reactive to light.  Cardiovascular:     Rate and Rhythm: Normal rate and regular rhythm.  Pulmonary:     Effort: Pulmonary effort is normal. No respiratory distress.     Comments: Lungs CTAB Abdominal:     General: There is no distension.     Tenderness: There is no guarding.  Musculoskeletal:        General: No deformity or signs of injury.     Cervical back: Neck supple.  Skin:    Findings: No lesion or rash.  Neurological:     General: No focal deficit present.     Mental Status: He is alert. Mental status is at baseline.     ED Results / Procedures / Treatments   Labs (all labs ordered are listed, but only abnormal results are displayed) Labs Reviewed  CBG MONITORING, ED - Abnormal; Notable for the following components:      Result Value   Glucose-Capillary 56 (*)    All other components within normal limits  GROUP A STREP BY PCR  SARS CORONAVIRUS 2 BY RT PCR  CBG MONITORING, ED    EKG None  Radiology No results found.  Procedures Procedures    Medications Ordered in ED Medications - No data to display  ED Course/ Medical Decision Making/ A&P                                 Medical Decision Making   77 year old male presenting to the Emergency Department with a roughly 3 hours of sore throat.  The patient denies any fevers, chills or any other complaints.  He denies any cough, chest pain, shortness of breath, ear pain, abdominal pain.  No known sick contacts.  He  is tolerating oral intake.  No unilateral nature to the swelling.  On arrival, the patient was vitally stable, afebrile, not tachycardic, saturating well on room air.  Brief episode of hypoglycemia that resolved with juice and food.  COVID-19, influenza PCR testing was collected in addition to strep PCR testing which resulted  negative.  Symptoms are consistent with likely viral upper respiratory infection.  Low concern for peritonsillar abscess given short duration of symptoms, lack of evidence for this on exam.  Advise symptomatic management with Tylenol, continue oral rehydration, outpatient follow-up with his PCP.   Final Clinical Impression(s) / ED Diagnoses Final diagnoses:  Sore throat    Rx / DC Orders ED Discharge Orders     None         Ernie Avena, MD 12/26/22 0221

## 2022-12-26 NOTE — Discharge Instructions (Signed)
Your COVID and influenza testing was negative as was your strep testing.  This is likely an early viral infection.  Recommend you follow-up with your PCP, try Tylenol for pain control and continue to push oral fluids

## 2023-01-01 ENCOUNTER — Other Ambulatory Visit: Payer: Self-pay | Admitting: Internal Medicine

## 2023-01-01 DIAGNOSIS — I5032 Chronic diastolic (congestive) heart failure: Secondary | ICD-10-CM

## 2023-01-02 ENCOUNTER — Ambulatory Visit (INDEPENDENT_AMBULATORY_CARE_PROVIDER_SITE_OTHER): Payer: Medicare Other | Admitting: Internal Medicine

## 2023-01-02 ENCOUNTER — Ambulatory Visit (INDEPENDENT_AMBULATORY_CARE_PROVIDER_SITE_OTHER): Payer: Medicare Other

## 2023-01-02 VITALS — BP 122/70 | HR 64 | Temp 97.8°F | Ht 75.0 in | Wt 184.0 lb

## 2023-01-02 DIAGNOSIS — C61 Malignant neoplasm of prostate: Secondary | ICD-10-CM

## 2023-01-02 DIAGNOSIS — R059 Cough, unspecified: Secondary | ICD-10-CM | POA: Diagnosis not present

## 2023-01-02 DIAGNOSIS — R052 Subacute cough: Secondary | ICD-10-CM

## 2023-01-02 DIAGNOSIS — C641 Malignant neoplasm of right kidney, except renal pelvis: Secondary | ICD-10-CM | POA: Diagnosis not present

## 2023-01-02 DIAGNOSIS — I771 Stricture of artery: Secondary | ICD-10-CM | POA: Diagnosis not present

## 2023-01-02 DIAGNOSIS — R634 Abnormal weight loss: Secondary | ICD-10-CM

## 2023-01-02 NOTE — Progress Notes (Unsigned)
Subjective:  Patient ID: Antonio Adams, male    DOB: [birth year]  Age: 76 y.o. MRN: [id number]  CC: Cough   HPI Antonio Adams presents for f/up ----  He was recently seen in the ED for sore throat.  He complains of persistent cough productive of yellow phlegm, weight loss, heartburn, and arthralgias but denies fever, chills, night sweats, abdominal pain, hemoptysis, diarrhea, or constipation.  He has a prescription for oxycodone but has chosen not to take it for the musculoskeletal pain.  Outpatient Medications Prior to Visit  Medication Sig Dispense Refill   acetaminophen (TYLENOL) 500 MG tablet Take 1,000 mg by mouth every 6 (six) hours as needed for mild pain.     atorvastatin (LIPITOR) 80 MG tablet Take 1 tablet (80 mg total) by mouth daily. 90 tablet 1   calcitRIOL (ROCALTROL) 0.25 MCG capsule Take 0.25 mcg by mouth daily.     clopidogrel (PLAVIX) 75 MG tablet Take 1 tablet (75 mg total) by mouth daily. 90 tablet 3   Cyanocobalamin (B-12 PO) Take 1,000 mcg by mouth daily.     cyclobenzaprine (FLEXERIL) 5 MG tablet Take 1 tablet (5 mg total) by mouth 2 (two) times daily as needed for muscle spasms. 6 tablet 0   donepezil (ARICEPT) 10 MG tablet Take half tablet (5 mg) daily for 2 weeks, then one tablet daily. 30 tablet 11   DULoxetine (CYMBALTA) 20 MG capsule TAKE 1 CAPSULE BY MOUTH EVERY DAY 90 capsule 2   ferrous sulfate 325 (65 FE) MG tablet Take 1 tablet (325 mg total) by mouth 2 (two) times daily. Patient needs office visit for further refills (Patient taking differently: Take 325 mg by mouth daily with breakfast.) 60 tablet 1   fluocinonide-emollient (LIDEX-E) 0.05 % cream Apply 1 Application topically 2 (two) times daily. 60 g 2   furosemide (LASIX) 40 MG tablet TAKE 1 TABLET BY MOUTH EVERY DAY 90 tablet 0   isosorbide mononitrate (IMDUR) 60 MG 24 hr tablet TAKE 1 TABLET BY MOUTH EVERY DAY 90 tablet 2   metoprolol tartrate (LOPRESSOR) 25 MG tablet TAKE 1 TABLET BY  MOUTH TWICE A DAY 180 tablet 3   mirtazapine (REMERON SOL-TAB) 15 MG disintegrating tablet Take 15 mg by mouth at bedtime.     oxyCODONE ER (XTAMPZA ER) 9 MG C12A Take 1 capsule by mouth 2 (two) times daily as needed. 60 capsule 0   pantoprazole (PROTONIX) 20 MG tablet Take 1 tablet (20 mg total) by mouth daily. (Patient taking differently: Take 20 mg by mouth daily as needed for heartburn or indigestion.) 30 tablet 0   polyvinyl alcohol (LIQUIFILM TEARS) 1.4 % ophthalmic solution Place 1 drop into both eyes daily as needed for dry eyes.     sodium bicarbonate 650 MG tablet Take 2 tablets (1,300 mg total) by mouth 2 (two) times daily. 60 tablet 1   solifenacin (VESICARE) 5 MG tablet TAKE 1 TABLET (5 MG TOTAL) BY MOUTH DAILY. 90 tablet 1   tamsulosin (FLOMAX) 0.4 MG CAPS capsule TAKE 1 CAPSULE (0.4 MG TOTAL) BY MOUTH DAILY. (Patient taking differently: Take 0.4 mg by mouth daily.) 30 capsule 1   traMADol (ULTRAM) 50 MG tablet Take 1 tablet (50 mg total) by mouth every 12 (twelve) hours as needed. 30 tablet 2   zinc gluconate 50 MG tablet Take 1 tablet (50 mg total) by mouth daily. 90 tablet 1   No facility-administered medications prior to visit.    ROS Review of Systems  Constitutional:  Positive for fatigue. Negative for appetite change, chills, diaphoresis and unexpected weight change.  HENT:  Positive for sore throat. Negative for trouble swallowing.   Eyes: Negative.   Respiratory:  Positive for cough. Negative for choking, chest tightness, shortness of breath, wheezing and stridor.   Cardiovascular:  Negative for chest pain, palpitations and leg swelling.  Gastrointestinal:  Negative for abdominal pain, constipation, diarrhea, nausea and vomiting.  Genitourinary:  Negative for difficulty urinating.  Musculoskeletal:  Positive for arthralgias. Negative for back pain and myalgias.  Skin: Negative.   Neurological: Negative.  Negative for dizziness and weakness.  Hematological:  Negative  for adenopathy. Does not bruise/bleed easily.  Psychiatric/Behavioral: Negative.      Objective:  BP 122/70 (BP Location: Left Arm, Patient Position: Sitting, Cuff Size: Normal)   Pulse 64   Temp 97.8 F (36.6 C) (Oral)   Ht 6\' 3"  (1.905 m)   Wt 184 lb (83.5 kg)   SpO2 98%   BMI 23.00 kg/m   BP Readings from Last 3 Encounters:  01/02/23 122/70  12/26/22 (!) 186/93  12/25/22 (!) 165/97    Wt Readings from Last 3 Encounters:  01/02/23 184 lb (83.5 kg)  12/25/22 200 lb 2.8 oz (90.8 kg)  12/11/22 200 lb 3.2 oz (90.8 kg)    Physical Exam Vitals reviewed.  Constitutional:      General: He is not in acute distress.    Appearance: He is ill-appearing. He is not toxic-appearing or diaphoretic.  HENT:     Nose: Nose normal.     Mouth/Throat:     Mouth: Mucous membranes are moist.  Eyes:     General: No scleral icterus.    Conjunctiva/sclera: Conjunctivae normal.  Cardiovascular:     Rate and Rhythm: Normal rate and regular rhythm.     Heart sounds: Murmur heard.     Systolic murmur is present with a grade of 2/6.     No diastolic murmur is present.     No friction rub. No gallop.  Pulmonary:     Effort: No tachypnea.     Breath sounds: No stridor. Examination of the right-upper field reveals decreased breath sounds. Examination of the left-upper field reveals decreased breath sounds. Decreased breath sounds present. No wheezing, rhonchi or rales.  Abdominal:     General: Abdomen is flat.     Palpations: There is no mass.     Tenderness: There is no abdominal tenderness. There is no guarding.     Hernia: No hernia is present.  Musculoskeletal:        General: Normal range of motion.     Cervical back: Neck supple.     Right lower leg: No edema.     Left lower leg: No edema.  Skin:    General: Skin is warm and dry.  Neurological:     General: No focal deficit present.     Mental Status: He is alert.  Psychiatric:        Mood and Affect: Mood normal.         Behavior: Behavior normal.     Lab Results  Component Value Date   WBC 3.0 (L) 10/30/2022   HGB 10.9 (L) 12/25/2022   HCT 30.0 (L) 10/30/2022   PLT 172 10/30/2022   GLUCOSE 110 (H) 10/30/2022   CHOL 227 (H) 08/25/2022   TRIG 79 08/25/2022   HDL 47 08/25/2022   LDLCALC 164 (H) 08/25/2022   ALT 31 10/30/2022   AST 24  10/30/2022   NA 139 10/30/2022   K 4.5 10/30/2022   CL 109 10/30/2022   CREATININE 10.80 (H) 10/30/2022   BUN 57 (H) 10/30/2022   CO2 16 (L) 10/30/2022   TSH 2.577 10/04/2021   PSA 0.01 (L) 05/20/2020   INR 1.1 10/30/2022   HGBA1C 5.4 08/25/2022   MICROALBUR 97.0 12/18/2019    DG Chest 2 View  Result Date: 01/02/2023 CLINICAL DATA:  Cough for several weeks. EXAM: CHEST - 2 VIEW COMPARISON:  October 30, 2022 FINDINGS: Tortuosity of the aorta. Cardiomediastinal silhouette is normal. Mediastinal contours appear intact. There is no evidence of focal airspace consolidation, pleural effusion or pneumothorax. Osseous structures are without acute abnormality. Soft tissues are grossly normal. IMPRESSION: No active cardiopulmonary disease. Electronically Signed   By: Ted Mcalpine M.D.   On: 01/02/2023 12:48     Assessment & Plan:   Subacute cough- Chest x-ray is negative for mass or infiltrate.  There is no evidence of pulmonary edema or metastatic cancer in the thorax. -     DG Chest 2 View; Future  Abnormal weight loss -     DG Chest 2 View; Future  ADENOCARCINOMA, PROSTATE, GLEASON GRADE 6 -     DG Chest 2 View; Future  Renal cell carcinoma of right kidney metastatic to other site [facility]) -     DG Chest 2 View; Future     Follow-up: No follow-ups on file.  Sanda Linger, MD

## 2023-01-03 ENCOUNTER — Encounter: Payer: Self-pay | Admitting: Internal Medicine

## 2023-01-04 ENCOUNTER — Other Ambulatory Visit: Payer: Self-pay | Admitting: Internal Medicine

## 2023-01-04 ENCOUNTER — Telehealth: Payer: Self-pay | Admitting: Internal Medicine

## 2023-01-04 DIAGNOSIS — G8929 Other chronic pain: Secondary | ICD-10-CM | POA: Insufficient documentation

## 2023-01-04 NOTE — Telephone Encounter (Signed)
Patient was seen by Dr. Yetta Barre on 01/02/23 for shoulder pain. They wanted to know if it was possible to get a cortisone shot to help with the pain. Patient would like a call back at [phone].

## 2023-01-08 ENCOUNTER — Encounter (HOSPITAL_COMMUNITY)
Admission: RE | Admit: 2023-01-08 | Discharge: 2023-01-08 | Disposition: A | Discharge status: Home / Self Care | Payer: Medicare Other | Source: Ambulatory Visit | Attending: Nephrology | Admitting: Nephrology

## 2023-01-08 VITALS — BP 152/100 | HR 59 | Temp 97.7°F | Resp 17

## 2023-01-08 DIAGNOSIS — N185 Chronic kidney disease, stage 5: Secondary | ICD-10-CM | POA: Insufficient documentation

## 2023-01-08 LAB — POCT HEMOGLOBIN-HEMACUE: Hemoglobin: 11.7 g/dL — ABNORMAL LOW (ref 13.0–17.0)

## 2023-01-08 MED ORDER — EPOETIN ALFA-EPBX 40000 UNIT/ML IJ SOLN
INTRAMUSCULAR | Status: AC
  Administered 2023-01-08: 30000 [IU] via SUBCUTANEOUS
  Filled 2023-01-08: qty 1

## 2023-01-08 MED ORDER — EPOETIN ALFA-EPBX 40000 UNIT/ML IJ SOLN: 30000.0000 [IU] | INTRAMUSCULAR | Status: DC

## 2023-01-12 ENCOUNTER — Ambulatory Visit (INDEPENDENT_AMBULATORY_CARE_PROVIDER_SITE_OTHER): Payer: Medicare Other

## 2023-01-12 ENCOUNTER — Telehealth: Payer: Medicare Other | Admitting: Nurse Practitioner

## 2023-01-12 VITALS — Ht 75.0 in | Wt 184.0 lb

## 2023-01-12 DIAGNOSIS — Z1212 Encounter for screening for malignant neoplasm of rectum: Secondary | ICD-10-CM | POA: Diagnosis not present

## 2023-01-12 DIAGNOSIS — Z1211 Encounter for screening for malignant neoplasm of colon: Secondary | ICD-10-CM

## 2023-01-12 DIAGNOSIS — Z0000 Encounter for general adult medical examination without abnormal findings: Secondary | ICD-10-CM | POA: Diagnosis not present

## 2023-01-12 DIAGNOSIS — J40 Bronchitis, not specified as acute or chronic: Secondary | ICD-10-CM | POA: Insufficient documentation

## 2023-01-12 MED ORDER — DOXYCYCLINE HYCLATE 100 MG PO TABS
100.0000 mg | ORAL_TABLET | Freq: Two times a day (BID) | ORAL | 0 refills | Status: DC
Start: 2023-01-12 — End: 2023-02-07

## 2023-01-12 NOTE — Progress Notes (Signed)
Subjective:   Antonio Adams is a 77 y.o. male who presents for Medicare Annual/Subsequent preventive examination.  Visit Complete: Virtual  I connected with  [name] on 01/12/23 by a audio enabled telemedicine application and verified that I am speaking with the correct person using two identifiers.  Patient Location: Home  Provider Location: Office/Clinic  I discussed the limitations of evaluation and management by telemedicine. The patient expressed understanding and agreed to proceed.  Vital Signs: Because this visit was a virtual/telehealth visit, some criteria may be missing or patient reported. Any vitals not documented were not able to be obtained and vitals that have been documented are patient reported.    Review of Systems    Cardiac Risk Factors include: advanced age (>68men, >66 women);hypertension;diabetes mellitus;Other (see comment), Risk factor comments: Atherosclerosis of Aorta, OSA, CHF, BPH     Objective:    Today's Vitals   01/12/23 0814  Weight: 184 lb (83.5 kg)  Height: 6\' 3"  (1.905 m)   Body mass index is 23 kg/m.     01/12/2023    8:26 AM 12/25/2022    9:49 PM 10/30/2022   10:43 AM 10/18/2022   11:20 AM 08/30/2022   12:46 PM 08/29/2022   11:05 AM 08/25/2022    2:00 PM  Advanced Directives  Does Patient Have a Medical Advance Directive? Yes Yes Yes Yes Yes Yes No  Type of Estate agent of [place];Living will Healthcare Power of [name];Living will Living will Healthcare Power of [name] Power of [name] Power of [name];Living will   Does patient want to make changes to medical advance directive?    No - Patient declined No - Guardian declined    Copy of Healthcare Power of Attorney in Chart? No - copy requested   No - copy requested No - copy requested    Would patient like information on creating a medical advance directive?    No - Patient declined No - Patient declined  No - Patient  declined    Current Medications (verified) Outpatient Encounter Medications as of 01/12/2023  Medication Sig   acetaminophen (TYLENOL) 500 MG tablet Take 1,000 mg by mouth every 6 (six) hours as needed for mild pain.   atorvastatin (LIPITOR) 80 MG tablet Take 1 tablet (80 mg total) by mouth daily.   calcitRIOL (ROCALTROL) 0.25 MCG capsule Take 0.25 mcg by mouth daily.   clopidogrel (PLAVIX) 75 MG tablet Take 1 tablet (75 mg total) by mouth daily.   Cyanocobalamin (B-12 PO) Take 1,000 mcg by mouth daily.   cyclobenzaprine (FLEXERIL) 5 MG tablet Take 1 tablet (5 mg total) by mouth 2 (two) times daily as needed for muscle spasms.   donepezil (ARICEPT) 10 MG tablet Take half tablet (5 mg) daily for 2 weeks, then one tablet daily.   DULoxetine (CYMBALTA) 20 MG capsule TAKE 1 CAPSULE BY MOUTH EVERY DAY   ferrous sulfate 325 (65 FE) MG tablet Take 1 tablet (325 mg total) by mouth 2 (two) times daily. Patient needs office visit for further refills (Patient taking differently: Take 325 mg by mouth daily with breakfast.)   fluocinonide-emollient (LIDEX-E) 0.05 % cream Apply 1 Application topically 2 (two) times daily.   furosemide (LASIX) 40 MG tablet TAKE 1 TABLET BY MOUTH EVERY DAY   isosorbide mononitrate (IMDUR) 60 MG 24 hr tablet TAKE 1 TABLET BY MOUTH EVERY DAY   metoprolol tartrate (LOPRESSOR) 25 MG tablet TAKE 1 TABLET BY MOUTH TWICE A DAY  mirtazapine (REMERON SOL-TAB) 15 MG disintegrating tablet Take 15 mg by mouth at bedtime.   oxyCODONE ER (XTAMPZA ER) 9 MG C12A Take 1 capsule by mouth 2 (two) times daily as needed.   pantoprazole (PROTONIX) 20 MG tablet Take 1 tablet (20 mg total) by mouth daily. (Patient taking differently: Take 20 mg by mouth daily as needed for heartburn or indigestion.)   polyvinyl alcohol (LIQUIFILM TEARS) 1.4 % ophthalmic solution Place 1 drop into both eyes daily as needed for dry eyes.   sodium bicarbonate 650 MG tablet Take 2 tablets (1,300 mg total) by mouth 2  (two) times daily.   solifenacin (VESICARE) 5 MG tablet TAKE 1 TABLET (5 MG TOTAL) BY MOUTH DAILY.   tamsulosin (FLOMAX) 0.4 MG CAPS capsule TAKE 1 CAPSULE (0.4 MG TOTAL) BY MOUTH DAILY. (Patient taking differently: Take 0.4 mg by mouth daily.)   traMADol (ULTRAM) 50 MG tablet Take 1 tablet (50 mg total) by mouth every 12 (twelve) hours as needed.   zinc gluconate 50 MG tablet Take 1 tablet (50 mg total) by mouth daily.   No facility-administered encounter medications on file as of 01/12/2023.    Allergies (verified) Ace inhibitors, Gabapentin, Xyzal [levocetirizine], and Opdivo [nivolumab]   History: Past Medical History:  Diagnosis Date   ADENOCARCINOMA, PROSTATE, GLEASON GRADE 6 12/21/2009   prostate cancer   ALLERGIC RHINITIS 03/16/2007   Allergy    Anxiety    Arthritis    back    BACK PAIN WITH RADICULOPATHY 11/23/2008   Cancer of kidney (HCC)    partial right kidney removed   CARPAL TUNNEL SYNDROME, BILATERAL 03/16/2007   Chronic back pain    Constipation    takes Senokot daily   Depression    occasionally   DIABETES MELLITUS, TYPE II 03/16/2007   takes Januvia and MEtformin daily   GASTROINTESTINAL HEMORRHAGE, HX OF 03/16/2007   GERD 03/16/2007   takes Omeprazole daily   Glaucoma    mild - no eye drops   Hemorrhoids    History of colon polyps    HYPERLIPIDEMIA 03/16/2007   takes Crestor daily   HYPERTENSION 03/16/2007   takes Diltiazem and Lisinopril daily    Kidney cancer, primary, with metastasis from kidney to other site [facility])    LEG CRAMPS 05/22/2007   Nocturia    Overactive bladder    PARESTHESIA 05/21/2007   Proctitis    PTSD (post-traumatic stress disorder)    wakes up may be dreaming of fighting   Rectal bleeding    Dr.Norins has explained its from the Radiation that he has received   Rectal bleeding    Renal cell carcinoma (HCC)    Sleep apnea    uses CPAP nightly   Stomach cancer (HCC)    Urinary frequency    takes Toviaz daily   Past Surgical History:   Procedure Laterality Date   ADRENALECTOMY Right 02/26/2020   Procedure: OPEN RIGHT RENAL CELL METASTATECTOMY AND  ADRENALECTOMY;  Surgeon: Crist Fat, MD;  Location: [facility];  Service: Urology;  Laterality: Right;   BACK SURGERY  1983, 2008   repeat surgery; ESI '08   CARPAL TUNNEL RELEASE     bilateral   CERVICAL FUSION     COLONOSCOPY     colonosocpy     ESOPHAGOGASTRODUODENOSCOPY     ED   FLEXIBLE SIGMOIDOSCOPY N/A 01/08/2017   Procedure: FLEXIBLE SIGMOIDOSCOPY;  Surgeon: Hilarie Fredrickson, MD;  Location: [facility] ENDOSCOPY;  Service: Endoscopy;  Laterality: N/A;  HOT HEMOSTASIS N/A 01/08/2017   Procedure: HOT HEMOSTASIS (ARGON PLASMA COAGULATION/BICAP);  Surgeon: Hilarie Fredrickson, MD;  Location: [facility] ENDOSCOPY;  Service: Endoscopy;  Laterality: N/A;   IR RADIOLOGIST EVAL & MGMT  12/17/2019   IR RADIOLOGIST EVAL & MGMT  07/01/2020   IR RADIOLOGIST EVAL & MGMT  08/25/2020   LUMBAR LAMINECTOMY  10/30/2011   Procedure: MICRODISCECTOMY LUMBAR LAMINECTOMY;  Surgeon: Kerrin Champagne, MD;  Location: [facility] OR;  Service: Orthopedics;  Laterality: Right;  Right L4-5 and L5-S1 Microdiscectomy   LUMBAR LAMINECTOMY  06/17/2012   Procedure: MICRODISCECTOMY LUMBAR LAMINECTOMY;  Surgeon: Kerrin Champagne, MD;  Location: [facility] OR;  Service: Orthopedics;  Laterality: N/A;  Right L5-S1 microdiscectomy   OPEN PARTIAL HEPATECTOMY  N/A 02/26/2020   Procedure: OPEN PARTIAL HEPATECTOMY, CHOLECYSTECTOMY;  Surgeon: Almond Lint, MD;  Location: [facility];  Service: General;  Laterality: N/A;   POLYPECTOMY     PROSTATE SURGERY  march 2012   seed implant   RADIOFREQUENCY ABLATION N/A 07/21/2020   Procedure: CT MICROWAVE ABLATION;  Surgeon: Sterling Big, MD;  Location: [facility];  Service: Anesthesiology;  Laterality: N/A;   ROBOTIC ASSITED PARTIAL NEPHRECTOMY Right 09/08/2015   Procedure: XI ROBOTIC ASSITED RIGHT PARTIAL NEPHRECTOMY;  Surgeon: Crist Fat, MD;  Location: [facility];  Service: Urology;  Laterality: Right;  Clamp on:  1033 Clamp off: 1103 Total Clamp Time: 30 minutes   SHOULDER ARTHROSCOPY WITH SUBACROMIAL DECOMPRESSION Left 01/20/2016   Procedure: LEFT SHOULDER ARTHROSCOPY WITH EXTENSIVE  DEBRIDEMENT, ACROMIOPLASTY;  Surgeon: Loreta Ave, MD;  Location:  SURGERY CENTER;  Service: Orthopedics;  Laterality: Left;   SHOULDER SURGERY  LFT   Stress Cardiolite  12/24/2003   negative for ischemia   UPPER ESOPHAGEAL ENDOSCOPIC ULTRASOUND (EUS)  04/18/2016   [facility]   UPPER GASTROINTESTINAL ENDOSCOPY     WOUND EXPLORATION     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   Family History  Problem Relation Age of Onset   Cancer Sister 30       Cancer, unsure type   Hypertension Other    Colon cancer Neg Hx    Esophageal cancer Neg Hx    Stomach cancer Neg Hx    Diabetes Neg Hx    Colon polyps Neg Hx    Rectal cancer Neg Hx    Social History   Socioeconomic History   Marital status: Widowed    Spouse name: Pattricia Boss   Number of children: 3   Years of education: 12+   Highest education level: Not on file  Occupational History   Occupation: Patent examiner; Chief Strategy Officer: RETIRED    Comment: retired from Patent examiner  Tobacco Use   Smoking status: Never   Smokeless tobacco: Never  Vaping Use   Vaping status: Never Used  Substance and Sexual Activity   Alcohol use: Not Currently    Alcohol/week: 0.0 standard drinks of alcohol   Drug use: No   Sexual activity: Not Currently    Partners: Female  Other Topics Concern   Not on file  Social History Narrative   Married-divorced '89, remarried '96. 2 sons, 1 daughter. 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 He wishes to be a full resuscitation candidate.       Does not live alone      Social Determinants of Health   Financial Resource Strain: Low Risk  (01/12/2023)  Overall Financial Resource Strain (CARDIA)    Difficulty of Paying Living Expenses: Not  hard at all  Food Insecurity: No Food Insecurity (01/12/2023)   Hunger Vital Sign    Worried About Running Out of Food in the Last Year: Never true    Ran Out of Food in the Last Year: Never true  Transportation Needs: No Transportation Needs (01/12/2023)   PRAPARE - Administrator, Civil Service (Medical): No    Lack of Transportation (Non-Medical): No  Physical Activity: Inactive (01/12/2023)   Exercise Vital Sign    Days of Exercise per Week: 0 days    Minutes of Exercise per Session: 0 min  Stress: Stress Concern Present (01/12/2023)   [employer] of Occupational Health - Occupational Stress Questionnaire    Feeling of Stress : To some extent  Social Connections: Moderately Isolated (01/12/2023)   Social Connection and Isolation Panel [NHANES]    Frequency of Communication with Friends and Family: More than three times a week    Frequency of Social Gatherings with Friends and Family: Once a week    Attends Religious Services: 1 to 4 times per year    Active Member of [employer] or Organizations: No    Attends Banker Meetings: Never    Marital Status: Widowed    Tobacco Counseling Counseling given: Not Answered   Clinical Intake:  Pre-visit preparation completed: Yes  Pain : No/denies pain     BMI - recorded: 23 Nutritional Risks: None Diabetes: Yes CBG done?: No Did pt. bring in CBG monitor from home?: No  How often do you need to have someone help you when you read instructions, pamphlets, or other written materials from your doctor or pharmacy?: 5 - Always  Interpreter Needed?: No  Information entered by :: Juanya Villavicencio, RMA   Activities of Daily Living    01/12/2023    8:19 AM 08/25/2022    2:00 PM  In your present state of health, do you have any difficulty performing the following activities:  Hearing? 0 0  Vision? 0 0  Difficulty concentrating or making decisions? 1 1  Walking or climbing stairs? 1 0  Dressing or bathing? 0 1   Doing errands, shopping? 1 0  Preparing Food and eating ? N   Using the Toilet? N   In the past six months, have you accidently leaked urine? N   Do you have problems with loss of bowel control? N   Managing your Medications? Y   Managing your Finances? N   Housekeeping or managing your Housekeeping? Y     Patient Care Team: Etta Grandchild, MD as PCP - General (Internal Medicine) Jodelle Red, MD as PCP - Cardiology (Cardiology) Kerrin Champagne, MD (Inactive) as Consulting Physician (Orthopedic Surgery) Ihor Gully, MD (Inactive) as Consulting Physician (Urology) [facility] as Referring Physician (General Practice) Hilarie Fredrickson, MD as Consulting Physician (Gastroenterology)  Indicate any recent Medical Services you may have received from other than [facility] providers in the past year (date may be approximate).     Assessment:   This is a routine wellness examination for [place].  Hearing/Vision screen Hearing Screening - Comments:: Denies hearing difficulties   Vision Screening - Comments:: Wear eyeglasses  Dietary issues and exercise activities discussed:     Goals Addressed   None   Depression Screen    01/12/2023    8:33 AM 01/02/2023   10:41 AM 05/29/2022   10:09 AM  05/29/2022   10:01 AM 04/25/2022   10:16 AM 04/25/2022   10:15 AM 01/06/2022    1:12 PM  PHQ 2/9 Scores  PHQ - 2 Score 1 2 1 1 1  0 0  PHQ- 9 Score 6 11 9  7       Fall Risk    01/12/2023    8:26 AM 01/02/2023   10:41 AM 10/18/2022   11:20 AM 08/30/2022   12:46 PM 05/29/2022   10:01 AM  Fall Risk   Falls in the past year? 0 0 1 1 0  Number falls in past yr: 0 0 1 1 0  Injury with Fall? 0 0 0 1 0  Risk for fall due to : No Fall Risks No Fall Risks   No Fall Risks  Follow up Falls prevention discussed;Falls evaluation completed Falls evaluation completed Falls evaluation completed Falls evaluation completed Falls evaluation completed    MEDICARE RISK AT  HOME: Medicare Risk at Home Any stairs in or around the home?: Yes If so, are there any without handrails?: Yes Home free of loose throw rugs in walkways, pet beds, electrical cords, etc?: Yes Adequate lighting in your home to reduce risk of falls?: Yes Life alert?: Yes Use of a cane, walker or w/c?: Yes Grab bars in the bathroom?: No Shower chair or bench in shower?: No Elevated toilet seat or a handicapped toilet?: No  TIMED UP AND GO:  Was the test performed?  No    Cognitive Function:    04/12/2022    9:00 AM 08/02/2017    3:19 PM 07/07/2015   12:08 PM  MMSE - Mini Mental State Exam  Orientation to time 3 5 5   Orientation to Place 5 5 5   Registration 3 3 3   Attention/ Calculation 3 4 1   Recall 1 2 2   Language- name 2 objects 2 2 2   Language- repeat 1 1 1   Language- follow 3 step command 3 3 3   Language- read & follow direction 1 1 1   Write a sentence 1 1 1   Copy design 1 1 1   Total score 24 28 25       10/22/2022    7:00 PM 12/26/2018   10:00 AM 05/29/2018   10:00 AM 10/15/2017   11:00 AM  Montreal Cognitive Assessment   Visuospatial/ Executive (0/5) 1  3 5   Naming (0/3) 3  3 3   Attention: Read list of digits (0/2) 2 1 2 2   Attention: Read list of letters (0/1) 1 1 1 1   Attention: Serial 7 subtraction starting at 100 (0/3) 0 1 2 3   Language: Repeat phrase (0/2) 1 0 1 2  Language : Fluency (0/1) 0 0 0 0  Abstraction (0/2) 1 0 1 2  Delayed Recall (0/5) 0 0 0 2  Orientation (0/6) 2 5 6 6   Total 11  19 26   Adjusted Score (based on education) 12         01/12/2023    8:27 AM 01/06/2022    1:12 PM  6CIT Screen  What Year? 4 points 0 points  What month? 0 points 0 points  What time? 0 points 0 points  Count back from 20 4 points 0 points  Months in reverse 0 points 0 points  Repeat phrase 10 points 0 points  Total Score 18 points 0 points    Immunizations Immunization History  Administered Date(s) Administered   Fluad Quad(high Dose 65+) 04/02/2019,  05/13/2020, 06/02/2021   Influenza Split 03/04/2014  Influenza Whole 02/13/2012   Influenza, High Dose Seasonal PF 02/03/2013, 03/18/2015, 03/20/2016, 02/09/2017, 02/12/2018, 03/04/2018, 02/15/2022   Influenza-Unspecified 03/16/2007, 04/19/2011, 02/23/2012, 02/13/2015, 03/15/2016, 05/13/2020, 02/01/2022   PFIZER(Purple Top)SARS-COV-2 Vaccination 11/15/2019, 12/06/2019   Pneumococcal Conjugate-13 12/10/2013, 03/04/2014   Pneumococcal Polysaccharide-23 01/14/2010, 07/07/2015, 01/01/2018   Pneumococcal-Unspecified 01/12/2004   Rotavirus Monovalent 01/01/2018   Td 01/29/2008   Tdap 07/18/2011, 11/01/2021   Zoster, Live 01/14/2010    TDAP status: Due, Education has been provided regarding the importance of this vaccine. Advised may receive this vaccine at local pharmacy or Health Dept. Aware to provide a copy of the vaccination record if obtained from local pharmacy or Health Dept. Verbalized acceptance and understanding.  Flu Vaccine status: Due, Education has been provided regarding the importance of this vaccine. Advised may receive this vaccine at local pharmacy or Health Dept. Aware to provide a copy of the vaccination record if obtained from local pharmacy or Health Dept. Verbalized acceptance and understanding.  Pneumococcal vaccine status: Up to date  Covid-19 vaccine status: Completed vaccines  Qualifies for Shingles Vaccine? Yes   Zostavax completed No   Shingrix Completed?: No.    Education has been provided regarding the importance of this vaccine. Patient has been advised to call insurance company to determine out of pocket expense if they have not yet received this vaccine. Advised may also receive vaccine at local pharmacy or Health Dept. Verbalized acceptance and understanding.  Screening Tests Health Maintenance  Topic Date Due   Zoster Vaccines- Shingrix (1 of 2) 04/11/1965   OPHTHALMOLOGY EXAM  11/03/2020   INFLUENZA VACCINE  12/14/2022   Colonoscopy  04/26/2023  (Originally 03/14/2021)   HEMOGLOBIN A1C  02/24/2023   FOOT EXAM  04/26/2023   DTaP/Tdap/Td (4 - Td or Tdap) 11/02/2031   Pneumonia Vaccine 80+ Years old  Completed   Hepatitis C Screening  Completed   HPV VACCINES  Aged Out   COVID-19 Vaccine  Discontinued    Health Maintenance  Health Maintenance Due  Topic Date Due   Zoster Vaccines- Shingrix (1 of 2) 04/11/1965   OPHTHALMOLOGY EXAM  11/03/2020   INFLUENZA VACCINE  12/14/2022    Colorectal cancer screening: Referral to GI placed For Cologuard 01/12/2023. Pt aware the office will call re: appt.  Lung Cancer Screening: (Low Dose CT Chest recommended if Age 67-80 years, 20 pack-year currently smoking OR have quit w/in 15years.) does not qualify.   Lung Cancer Screening Referral: N/A  Additional Screening:  Hepatitis C Screening: does qualify; Completed 07/07/2015  Vision Screening: Recommended annual ophthalmology exams for early detection of glaucoma and other disorders of the eye. Is the patient up to date with their annual eye exam?  Yes  Who is the provider or what is the name of the office in which the patient attends annual eye exams? V/A If pt is not established with a provider, would they like to be referred to a provider to establish care? No .   Dental Screening: Recommended annual dental exams for proper oral hygiene  Diabetic Foot Exam: Diabetic Foot Exam: Completed 04/25/2022  Community Resource Referral / Chronic Care Management: CRR required this visit?  No   CCM required this visit?  No     Plan:     I have personally reviewed and noted the following in the patient's chart:   Medical and social history Use of alcohol, tobacco or illicit drugs  Current medications and supplements including opioid prescriptions. Patient is not currently taking opioid prescriptions. Functional ability and  status Nutritional status Physical activity Advanced directives List of other physicians Hospitalizations,  surgeries, and ER visits in previous 12 months Vitals Screenings to include cognitive, depression, and falls Referrals and appointments  In addition, I have reviewed and discussed with patient certain preventive protocols, quality metrics, and best practice recommendations. A written personalized care plan for preventive services as well as general preventive health recommendations were provided to patient.     Kaile Bixler L Aliene Tamura, CMA   01/12/2023   After Visit Summary: (MyChart) Due to this being a telephonic visit, the after visit summary with patients personalized plan was offered to patient via MyChart   Nurse Notes: Patient is due for a Shingrix vaccine. Patient stated that he has a eye appointment coming up with the [facility] hospital soon. Patient will soon be due for a colonoscopy, however prefers to do a Cologuard.  Patient had no other concerns to address today.

## 2023-01-12 NOTE — Assessment & Plan Note (Signed)
Subacute At this point I think it is reasonable to treat with course of antibiotics Will prescribe doxycycline 100 mg BID x10 days.  Patient and fianc educated that if he is not showing any improvement by early to mid next week he should call to be evaluated in person and consider repeat chest x-ray to rule out pneumonia vs pulmonary edema/pleural effusion given his kidney function status They report their understanding

## 2023-01-12 NOTE — Patient Instructions (Addendum)
[name] , Thank you for taking time to come for your Medicare Wellness Visit. I appreciate your ongoing commitment to your health goals. Please review the following plan we discussed and let me know if I can assist you in the future.   Referrals/Orders/Follow-Ups/Clinician Recommendations: A order for a Cologuard has been placed today.  Be looking out for the stool kit to come to your home soon.  Please do test and send it back as soon as you can.  You are due for a Shingles vaccine and you can get this at your local pharmacy.    This is a list of the screening recommended for you and due dates:  Health Maintenance  Topic Date Due   Zoster (Shingles) Vaccine (1 of 2) 04/11/1965   Eye exam for diabetics  11/03/2020   DTaP/Tdap/Td vaccine (3 - Td or Tdap) 07/17/2021   Flu Shot  12/14/2022   Colon Cancer Screening  04/26/2023*   Hemoglobin A1C  02/24/2023   Complete foot exam   04/26/2023   Pneumonia Vaccine  Completed   Hepatitis C Screening  Completed   HPV Vaccine  Aged Out   COVID-19 Vaccine  Discontinued  *Topic was postponed. The date shown is not the original due date.    Advanced directives: (Copy Requested) Please bring a copy of your health care power of attorney and living will to the office to be added to your chart at your convenience.  Next Medicare Annual Wellness Visit scheduled for next year: Yes

## 2023-01-12 NOTE — Progress Notes (Signed)
   Established Patient Office Visit  An audio/visual tele-health visit was completed today for this patient. I connected with  Antonio Adams on 01/12/23 utilizing audio/visual technology and verified that I am speaking with the correct person using two identifiers. The patient was located at their home, and I was located at the office of [facility] at [facility] during the encounter. I discussed the limitations of evaluation and management by telemedicine. The patient expressed understanding and agreed to proceed.     Subjective   Patient ID: Antonio Adams, male    DOB: [birth year]  Age: 77 y.o. MRN: [id number]  Chief Complaint  Patient presents with   Cough   Patient arrives today for video visit accompanied by his fiance as supportive person for an acute complaint as listed above.  They tell me his cough started about 2 weeks ago.  He was seen in the emergency department around the time cough started, at that time he was tested for strep throat as well as COVID.  Both test were negative.  About 1 week later he saw his primary care provider in office.  At that time he was still experiencing productive and persistent cough x-ray was ordered which was negative for any acute cardiopulmonary disease.  Patient does have severe chronic kidney disease with last GFR of 5.  Today, he and his fiance report that he continues to have a cough and is somewhat fatigued.      Review of Systems  Constitutional:  Negative for fever.  HENT:  Positive for sinus pain.   Respiratory:  Positive for cough.       Objective:     There were no vitals taken for this visit.   Physical Exam Comprehensive physical exam not completed today as office visit was conducted remotely.  Patient appears overall well on video, no evidence of generalized acute distress or acute respiratory distress.   Patient was alert and oriented, and appeared to have appropriate judgment.   No results found  for any visits on 01/12/23.    The ASCVD Risk score (Arnett DK, et al., 2019) failed to calculate for the following reasons:   The patient has a prior MI or stroke diagnosis    Assessment & Plan:   Problem List Items Addressed This Visit       Respiratory   Bronchitis - Primary    Subacute At this point I think it is reasonable to treat with course of antibiotics Will prescribe doxycycline 100 mg BID x10 days.  Patient and fianc educated that if he is not showing any improvement by early to mid next week he should call to be evaluated in person and consider repeat chest x-ray to rule out pneumonia vs pulmonary edema/pleural effusion given his kidney function status They report their understanding      Relevant Medications   doxycycline (VIBRA-TABS) 100 MG tablet    No follow-ups on file.    Elenore Paddy, NP

## 2023-01-16 ENCOUNTER — Encounter: Payer: Self-pay | Admitting: Sports Medicine

## 2023-01-16 ENCOUNTER — Ambulatory Visit: Payer: Medicare Other | Admitting: Sports Medicine

## 2023-01-16 ENCOUNTER — Other Ambulatory Visit (INDEPENDENT_AMBULATORY_CARE_PROVIDER_SITE_OTHER): Payer: Medicare Other

## 2023-01-16 ENCOUNTER — Other Ambulatory Visit: Payer: Self-pay

## 2023-01-16 DIAGNOSIS — M19012 Primary osteoarthritis, left shoulder: Secondary | ICD-10-CM | POA: Diagnosis not present

## 2023-01-16 DIAGNOSIS — M25512 Pain in left shoulder: Secondary | ICD-10-CM

## 2023-01-16 DIAGNOSIS — M12812 Other specific arthropathies, not elsewhere classified, left shoulder: Secondary | ICD-10-CM

## 2023-01-16 DIAGNOSIS — E1142 Type 2 diabetes mellitus with diabetic polyneuropathy: Secondary | ICD-10-CM | POA: Diagnosis not present

## 2023-01-16 MED ORDER — METHYLPREDNISOLONE ACETATE 40 MG/ML IJ SUSP
40.0000 mg | INTRAMUSCULAR | Status: AC | PRN
Start: 2023-01-16 — End: 2023-01-16
  Administered 2023-01-16: 40 mg via INTRA_ARTICULAR

## 2023-01-16 MED ORDER — LIDOCAINE HCL 1 % IJ SOLN
2.0000 mL | INTRAMUSCULAR | Status: AC | PRN
Start: 2023-01-16 — End: 2023-01-16
  Administered 2023-01-16: 2 mL

## 2023-01-16 MED ORDER — BUPIVACAINE HCL 0.25 % IJ SOLN
2.0000 mL | INTRAMUSCULAR | Status: AC | PRN
Start: 2023-01-16 — End: 2023-01-16
  Administered 2023-01-16: 2 mL via INTRA_ARTICULAR

## 2023-01-16 NOTE — Progress Notes (Signed)
Antonio Adams - 77 y.o. male MRN [id number]  Date of birth: 1945/11/02  Office Visit Note: Visit Date: 01/16/2023 PCP: Etta Grandchild, MD Referred by: Etta Grandchild, MD  Subjective: Chief Complaint  Patient presents with   Shoulder Pain   HPI: Antonio Adams is a pleasant 77 y.o. male who presents today for chronic left shoulder pain.  Left shoulder pain for years.  He has chronic pain throughout the day but it is worse at nighttime.  Denies any numbness or tingling or radicular symptoms.  He has not had any physical therapy to date.  He does have tramadol 50 mg he takes twice daily as needed for breakthrough pain only - this didn't work as much so he does have Oxycodone ER 9mg  to take for breakthrough pain.  Lab Results  Component Value Date   HGBA1C 5.4 08/25/2022   HGBA1C 5.4 04/08/2022   HGBA1C 5.8 (H) 05/23/2021   Pertinent ROS were reviewed with the patient and found to be negative unless otherwise specified above in HPI.   Assessment & Plan: Visit Diagnoses:  1. Left shoulder pain, unspecified chronicity   2. Primary osteoarthritis, left shoulder   3. Rotator cuff arthropathy of left shoulder   4. Diabetic polyneuropathy associated with type 2 diabetes mellitus (HCC)    Plan: Discussed with Leonette Most she is dealing with an exacerbation of his chronic underlying shoulder pain with advanced osteoarthritis, he likely has a degree of rotator cuff arthropathy as well.  Given his stage V CKD, he is unable to take NSAIDs.  Through shared decision-making, did proceed with ultrasound-guided glenohumeral joint injection, patient tolerated well.  Advised 48 hours of modified rest and activity.  Encourage range of motion stretching for the shoulder.  He will use his extended release oxycodone 9 mg twice daily as needed only for breakthrough pain.  May also use Tylenol and ice as needed.  His recent A1c's were reasonably controlled, will monitor sugars for transient rise in  the next few days.  He will follow-up with me in 1 month for reevaluation.  Could consider home therapy program or formal PT in addition  Follow-up: Return in about 1 month (around 02/15/2023) for L-shoulder .   Meds & Orders: No orders of the defined types were placed in this encounter.   Orders Placed This Encounter  Procedures   Large Joint Inj: L glenohumeral   XR Shoulder Left   US Guided Needle Placement - No Linked Charges     Procedures: Large Joint Inj: L glenohumeral on 01/16/2023 10:23 AM Indications: pain Details: 22 G 3.5 in needle, ultrasound-guided posterior approach Medications: 2 mL lidocaine 1 %; 2 mL bupivacaine 0.25 %; 40 mg methylPREDNISolone acetate 40 MG/ML Outcome: tolerated well, no immediate complications  US-guided glenohumeral joint injection, left shoulder After discussion on risks/benefits/indications, informed verbal consent was obtained. A timeout was then performed. The patient was positioned lying lateral recumbent on examination table. The patient's shoulder was prepped with betadine and multiple alcohol swabs and utilizing ultrasound guidance, the patient's glenohumeral joint was identified on ultrasound. Using ultrasound guidance a 22-gauge, 3.5 inch needle with a mixture of 2:2:1 cc's lidocaine:bupivicaine:depomedrol was directed from a lateral to medial direction via in-plane technique into the glenohumeral joint with visualization of appropriate spread of injectate into the joint. Patient tolerated the procedure well without immediate complications.      Procedure, treatment alternatives, risks and benefits explained, specific risks discussed. Consent was given by the patient. Immediately prior  to procedure a time out was called to verify the correct patient, procedure, equipment, support staff and site/side marked as required. Patient was prepped and draped in the usual sterile fashion.          Clinical History: No specialty comments  available.  He reports that he has never smoked. He has never used smokeless tobacco.  Recent Labs    04/08/22 0321 08/25/22 0647  HGBA1C 5.4 5.4    Objective:    Physical Exam  Gen: Well-appearing, in no acute distress; non-toxic CV: Well-perfused. Warm.  Resp: Breathing unlabored on room air; no wheezing. Psych: Fluid speech in conversation; appropriate affect; normal thought process Neuro: Sensation intact throughout. No gross coordination deficits.   Ortho Exam - Left shoulder: There is some tenderness to palpation deep within the anterior glenohumeral joint.  Range of motion is limited both active and passively with some grating and endrange motion.  Forward flexion 165 degrees, external rotation 35 degrees and internal rotation with thumb to L4 compared to T12.  There is some pain with empty can and drop arm testing as well.  Imaging: XR Shoulder Left  Result Date: 01/16/2023 4 views of the left shoulder including AP, Grashey, scapular Y and axial view were ordered and reviewed by myself.  X-rays show significant advanced osteoarthritis of the glenohumeral joint.  There is some calcification noted lateral and superior to the greater tuberosity as well as over the [facility] joint, possibly indicative of chondrocalcinosis.  There is notable acetabular and humeral head sclerosis with a bony cyst over the medial humeral head.  No acute fracture noted.   Past Medical/Family/Surgical/Social History: Medications & Allergies reviewed per EMR, new medications updated. Patient Active Problem List   Diagnosis Date Noted   Bronchitis 01/12/2023   Chronic left shoulder pain 01/04/2023   Subacute cough 01/02/2023   Abnormal weight loss 01/02/2023   Encounter for palliative care involving management of pain 11/02/2022   Encounter for general adult medical examination with abnormal findings 01/23/2022   Anemia due to zinc deficiency 12/20/2021   Symptomatic anemia 12/15/2021   Cancer related pain  12/15/2021   Chronic heart failure with preserved ejection fraction (HFpEF) (HCC) 08/12/2021   Tinea cruris 08/12/2021   Anemia due to chronic kidney disease 07/20/2021   CKD (chronic kidney disease) stage 5, GFR less than 15 ml/min (HCC) 05/24/2021   BPH (benign prostatic hyperplasia) 05/24/2021   Senile nuclear sclerosis 05/20/2021   Vitamin D deficiency 05/20/2021   Vitreous degeneration, unspecified eye 05/20/2021   Intrinsic eczema 04/18/2021   Atherosclerosis of aorta (HCC) 12/30/2020   Mixed conductive and sensorineural hearing loss of left ear with restricted hearing of right ear 09/01/2020   Metastatic renal cell carcinoma (HCC) 02/26/2020   Diabetic polyneuropathy associated with type 2 diabetes mellitus (HCC) 12/18/2019   Murmur, cardiac 10/08/2018   DDD (degenerative disc disease), cervical 10/08/2018   OAB (overactive bladder) 04/04/2018   Seasonal allergic rhinitis due to pollen 11/01/2016   Spinal stenosis of lumbar region with radiculopathy 10/30/2011    Class: Chronic   Obstructive sleep apnea 11/24/2010   Cognitive impairment 11/24/2010   ADENOCARCINOMA, PROSTATE, GLEASON GRADE 6 12/21/2009   Essential hypertension 03/16/2007   GERD 03/16/2007   Past Medical History:  Diagnosis Date   ADENOCARCINOMA, PROSTATE, GLEASON GRADE 6 12/21/2009   prostate cancer   ALLERGIC RHINITIS 03/16/2007   Allergy    Anxiety    Arthritis    back    BACK PAIN WITH RADICULOPATHY 11/23/2008  Cancer of kidney (HCC)    partial right kidney removed   CARPAL TUNNEL SYNDROME, BILATERAL 03/16/2007   Chronic back pain    Constipation    takes Senokot daily   Depression    occasionally   DIABETES MELLITUS, TYPE II 03/16/2007   takes Januvia and MEtformin daily   GASTROINTESTINAL HEMORRHAGE, HX OF 03/16/2007   GERD 03/16/2007   takes Omeprazole daily   Glaucoma    mild - no eye drops   Hemorrhoids    History of colon polyps    HYPERLIPIDEMIA 03/16/2007   takes Crestor daily    HYPERTENSION 03/16/2007   takes Diltiazem and Lisinopril daily    Kidney cancer, primary, with metastasis from kidney to other site [facility])    LEG CRAMPS 05/22/2007   Nocturia    Overactive bladder    PARESTHESIA 05/21/2007   Proctitis    PTSD (post-traumatic stress disorder)    wakes up may be dreaming of fighting   Rectal bleeding    Dr.Norins has explained its from the Radiation that he has received   Rectal bleeding    Renal cell carcinoma (HCC)    Sleep apnea    uses CPAP nightly   Stomach cancer (HCC)    Urinary frequency    takes Toviaz daily   Family History  Problem Relation Age of Onset   Cancer Sister 30       Cancer, unsure type   Hypertension Other    Colon cancer Neg Hx    Esophageal cancer Neg Hx    Stomach cancer Neg Hx    Diabetes Neg Hx    Colon polyps Neg Hx    Rectal cancer Neg Hx    Past Surgical History:  Procedure Laterality Date   ADRENALECTOMY Right 02/26/2020   Procedure: OPEN RIGHT RENAL CELL METASTATECTOMY AND  ADRENALECTOMY;  Surgeon: Crist Fat, MD;  Location: [facility];  Service: Urology;  Laterality: Right;   BACK SURGERY  1983, 2008   repeat surgery; [facility] '08   CARPAL TUNNEL RELEASE     bilateral   CERVICAL FUSION     COLONOSCOPY     colonosocpy     ESOPHAGOGASTRODUODENOSCOPY     ED   FLEXIBLE SIGMOIDOSCOPY N/A 01/08/2017   Procedure: FLEXIBLE SIGMOIDOSCOPY;  Surgeon: Hilarie Fredrickson, MD;  Location: [facility] ENDOSCOPY;  Service: Endoscopy;  Laterality: N/A;   HOT HEMOSTASIS N/A 01/08/2017   Procedure: HOT HEMOSTASIS (ARGON PLASMA COAGULATION/BICAP);  Surgeon: Hilarie Fredrickson, MD;  Location: [facility] ENDOSCOPY;  Service: Endoscopy;  Laterality: N/A;   IR RADIOLOGIST EVAL & MGMT  12/17/2019   IR RADIOLOGIST EVAL & MGMT  07/01/2020   IR RADIOLOGIST EVAL & MGMT  08/25/2020   LUMBAR LAMINECTOMY  10/30/2011   Procedure: MICRODISCECTOMY LUMBAR LAMINECTOMY;  Surgeon: Kerrin Champagne, MD;  Location: [facility] OR;  Service: Orthopedics;  Laterality: Right;  Right L4-5 and  L5-S1 Microdiscectomy   LUMBAR LAMINECTOMY  06/17/2012   Procedure: MICRODISCECTOMY LUMBAR LAMINECTOMY;  Surgeon: Kerrin Champagne, MD;  Location: [facility] OR;  Service: Orthopedics;  Laterality: N/A;  Right L5-S1 microdiscectomy   OPEN PARTIAL HEPATECTOMY  N/A 02/26/2020   Procedure: OPEN PARTIAL HEPATECTOMY, CHOLECYSTECTOMY;  Surgeon: Almond Lint, MD;  Location: [facility];  Service: General;  Laterality: N/A;   POLYPECTOMY     PROSTATE SURGERY  march 2012   seed implant   RADIOFREQUENCY ABLATION N/A 07/21/2020   Procedure: CT MICROWAVE ABLATION;  Surgeon: Sterling Big, MD;  Location: [facility];  Service: Anesthesiology;  Laterality: N/A;   ROBOTIC ASSITED PARTIAL NEPHRECTOMY Right 09/08/2015   Procedure: XI ROBOTIC ASSITED RIGHT PARTIAL NEPHRECTOMY;  Surgeon: Crist Fat, MD;  Location: [facility];  Service: Urology;  Laterality: Right;  Clamp on: 1033 Clamp off: 1103 Total Clamp Time: 30 minutes   SHOULDER ARTHROSCOPY WITH SUBACROMIAL DECOMPRESSION Left 01/20/2016   Procedure: LEFT SHOULDER ARTHROSCOPY WITH EXTENSIVE  DEBRIDEMENT, ACROMIOPLASTY;  Surgeon: Loreta Ave, MD;  Location: [facility];  Service: Orthopedics;  Laterality: Left;   SHOULDER SURGERY  LFT   Stress Cardiolite  12/24/2003   negative for ischemia   UPPER ESOPHAGEAL ENDOSCOPIC ULTRASOUND (EUS)  04/18/2016   [facility]   UPPER GASTROINTESTINAL ENDOSCOPY     WOUND EXPLORATION     management of a wound that he sustained in the [employer]   Social History   Occupational History   Occupation: Patent examiner; Chief Strategy Officer: RETIRED    Comment: retired from Patent examiner  Tobacco Use   Smoking status: Never   Smokeless tobacco: Never  Vaping Use   Vaping status: Never Used  Substance and Sexual Activity   Alcohol use: Not Currently    Alcohol/week: 0.0 standard drinks of alcohol   Drug use: No   Sexual activity: Not Currently    Partners: Female

## 2023-01-22 ENCOUNTER — Other Ambulatory Visit: Payer: Self-pay

## 2023-01-22 ENCOUNTER — Encounter (HOSPITAL_COMMUNITY): Payer: Self-pay

## 2023-01-22 ENCOUNTER — Emergency Department (HOSPITAL_COMMUNITY): Payer: Medicare Other

## 2023-01-22 ENCOUNTER — Emergency Department (HOSPITAL_COMMUNITY)
Admission: EM | Admit: 2023-01-22 | Discharge: 2023-01-22 | Disposition: A | Discharge status: Home / Self Care | Payer: Medicare Other | Attending: Emergency Medicine | Admitting: Emergency Medicine

## 2023-01-22 ENCOUNTER — Encounter (HOSPITAL_COMMUNITY)
Admission: RE | Admit: 2023-01-22 | Discharge: 2023-01-22 | Disposition: A | Discharge status: Home / Self Care | Payer: Medicare Other | Source: Ambulatory Visit | Attending: Nephrology | Admitting: Nephrology

## 2023-01-22 DIAGNOSIS — Z8546 Personal history of malignant neoplasm of prostate: Secondary | ICD-10-CM | POA: Diagnosis not present

## 2023-01-22 DIAGNOSIS — Z79899 Other long term (current) drug therapy: Secondary | ICD-10-CM | POA: Diagnosis not present

## 2023-01-22 DIAGNOSIS — E860 Dehydration: Secondary | ICD-10-CM | POA: Insufficient documentation

## 2023-01-22 DIAGNOSIS — R531 Weakness: Secondary | ICD-10-CM | POA: Diagnosis not present

## 2023-01-22 DIAGNOSIS — I120 Hypertensive chronic kidney disease with stage 5 chronic kidney disease or end stage renal disease: Secondary | ICD-10-CM | POA: Insufficient documentation

## 2023-01-22 DIAGNOSIS — Z7902 Long term (current) use of antithrombotics/antiplatelets: Secondary | ICD-10-CM | POA: Diagnosis not present

## 2023-01-22 DIAGNOSIS — E1122 Type 2 diabetes mellitus with diabetic chronic kidney disease: Secondary | ICD-10-CM | POA: Insufficient documentation

## 2023-01-22 DIAGNOSIS — Z85528 Personal history of other malignant neoplasm of kidney: Secondary | ICD-10-CM | POA: Insufficient documentation

## 2023-01-22 DIAGNOSIS — I959 Hypotension, unspecified: Secondary | ICD-10-CM | POA: Insufficient documentation

## 2023-01-22 DIAGNOSIS — N185 Chronic kidney disease, stage 5: Secondary | ICD-10-CM | POA: Diagnosis not present

## 2023-01-22 LAB — CBC WITH DIFFERENTIAL/PLATELET
Abs Immature Granulocytes: 0.01 10*3/uL (ref 0.00–0.07)
Basophils Absolute: 0 10*3/uL (ref 0.0–0.1)
Basophils Relative: 1 %
Eosinophils Absolute: 0.1 10*3/uL (ref 0.0–0.5)
Eosinophils Relative: 2 %
HCT: 38 % — ABNORMAL LOW (ref 39.0–52.0)
Hemoglobin: 11.7 g/dL — ABNORMAL LOW (ref 13.0–17.0)
Immature Granulocytes: 0 %
Lymphocytes Relative: 21 %
Lymphs Abs: 0.7 10*3/uL (ref 0.7–4.0)
MCH: 28.3 pg (ref 26.0–34.0)
MCHC: 30.8 g/dL (ref 30.0–36.0)
MCV: 92 fL (ref 80.0–100.0)
Monocytes Absolute: 0.5 10*3/uL (ref 0.1–1.0)
Monocytes Relative: 14 %
Neutro Abs: 2 10*3/uL (ref 1.7–7.7)
Neutrophils Relative %: 62 %
Platelets: 191 10*3/uL (ref 150–400)
RBC: 4.13 MIL/uL — ABNORMAL LOW (ref 4.22–5.81)
RDW: 17.4 % — ABNORMAL HIGH (ref 11.5–15.5)
WBC: 3.3 10*3/uL — ABNORMAL LOW (ref 4.0–10.5)
nRBC: 0 % (ref 0.0–0.2)

## 2023-01-22 LAB — COMPREHENSIVE METABOLIC PANEL
ALT: 26 U/L (ref 0–44)
AST: 30 U/L (ref 15–41)
Albumin: 3.3 g/dL — ABNORMAL LOW (ref 3.5–5.0)
Alkaline Phosphatase: 86 U/L (ref 38–126)
Anion gap: 13 (ref 5–15)
BUN: 78 mg/dL — ABNORMAL HIGH (ref 8–23)
CO2: 19 mmol/L — ABNORMAL LOW (ref 22–32)
Calcium: 9.7 mg/dL (ref 8.9–10.3)
Chloride: 105 mmol/L (ref 98–111)
Creatinine, Ser: 10.95 mg/dL — ABNORMAL HIGH (ref 0.61–1.24)
GFR, Estimated: 4 mL/min — ABNORMAL LOW (ref >60)
Glucose, Bld: 113 mg/dL — ABNORMAL HIGH (ref 70–99)
Potassium: 5.5 mmol/L — ABNORMAL HIGH (ref 3.5–5.1)
Sodium: 137 mmol/L (ref 135–145)
Total Bilirubin: 0.5 mg/dL (ref 0.3–1.2)
Total Protein: 6.2 g/dL — ABNORMAL LOW (ref 6.5–8.1)

## 2023-01-22 LAB — TROPONIN I (HIGH SENSITIVITY): Troponin I (High Sensitivity): 91 ng/L — ABNORMAL HIGH (ref <18)

## 2023-01-22 LAB — MAGNESIUM: Magnesium: 2.6 mg/dL — ABNORMAL HIGH (ref 1.7–2.4)

## 2023-01-22 MED ORDER — LACTATED RINGERS IV BOLUS
1000.0000 mL | Freq: Once | INTRAVENOUS | Status: AC
  Administered 2023-01-22: 1000 mL via INTRAVENOUS

## 2023-01-22 NOTE — Progress Notes (Signed)
Patient's BP 69/48, 63, 97.2, 98 o2 sat. Bp cuff was changed and done in both arms. Patient stated he did not feel well, was dizzy/ lightheaded and his wife said he felt like his knees were wobbly when getting dressed this Am. Patient and spouse were taken to ED and Report given to Triage nurse.

## 2023-01-22 NOTE — ED Triage Notes (Signed)
Pt coming from short stay, pt was supposed to receive an infusion for his kidneys today but he was hypotensive in the clinic. Pt c.o generalized weakness since yesterday and vomiting that started upon arrival to ED.

## 2023-01-22 NOTE — Discharge Instructions (Addendum)
Please follow-up outpatient with your primary care provider, you likely had slightly low blood pressure from dehydration today.  You are at the point of your renal disease where you would benefit potentially from dialysis however you have declined dialysis in the past.  Your cardiac troponin was elevated but has remained elevated previously and you are currently chest pain free.  This is likely an elevation in the setting of your renal disease.  To the emergency department if you develop any severe chest pain, shortness of breath, change in mental status or any other concerns or complaints.

## 2023-01-22 NOTE — ED Provider Notes (Signed)
[place] EMERGENCY DEPARTMENT AT [facility] Provider Note   CSN: [id number] Arrival date & time: 01/22/23  1056     History  Chief Complaint  Patient presents with   Hypotension    Antonio Adams is a 77 y.o. male.  HPI   77 year old male with medical history significant for HLD, HTN, GERD, DM2, OSA, prostate cancer, renal cell carcinoma, CVA, CKD stage 5 who presents to the ER with a chief complaint of hypotension. The patient presented initially to [facility] infusion center.  He does not want dialysis. He gets Retacrit injections related to his CKD. At the infusion center his BP was low, reportedly 61/51. He was sent emergently to the ER for management. On arrival, he states that he feels fine. Initial blood pressure and heart rate was normal on arrival.  Home Medications Prior to Admission medications   Medication Sig Start Date End Date Taking? Authorizing Provider  acetaminophen (TYLENOL) 500 MG tablet Take 1,000 mg by mouth every 6 (six) hours as needed for mild pain.    [provider]  atorvastatin (LIPITOR) 80 MG tablet Take 1 tablet (80 mg total) by mouth daily. 08/26/22   Almon Hercules, MD  calcitRIOL (ROCALTROL) 0.25 MCG capsule Take 0.25 mcg by mouth daily. 01/05/22   [provider]  clopidogrel (PLAVIX) 75 MG tablet Take 1 tablet (75 mg total) by mouth daily. 08/30/22   Marcos Eke, PA-C  Cyanocobalamin (B-12 PO) Take 1,000 mcg by mouth daily.    [provider]  cyclobenzaprine (FLEXERIL) 5 MG tablet Take 1 tablet (5 mg total) by mouth 2 (two) times daily as needed for muscle spasms. 03/19/22   Blane Ohara, MD  donepezil (ARICEPT) 10 MG tablet Take half tablet (5 mg) daily for 2 weeks, then one tablet daily. 10/18/22   Marcos Eke, PA-C  doxycycline (VIBRA-TABS) 100 MG tablet Take 1 tablet (100 mg total) by mouth 2 (two) times daily. 01/12/23   Elenore Paddy, NP  DULoxetine (CYMBALTA) 20 MG capsule TAKE 1 CAPSULE BY  MOUTH EVERY DAY 12/06/22   Etta Grandchild, MD  ferrous sulfate 325 (65 FE) MG tablet Take 1 tablet (325 mg total) by mouth 2 (two) times daily. Patient needs office visit for further refills Patient taking differently: Take 325 mg by mouth daily with breakfast. 02/24/19   Hilarie Fredrickson, MD  fluocinonide-emollient (LIDEX-E) 0.05 % cream Apply 1 Application topically 2 (two) times daily. 08/30/22   Etta Grandchild, MD  furosemide (LASIX) 40 MG tablet TAKE 1 TABLET BY MOUTH EVERY DAY 01/01/23   Etta Grandchild, MD  isosorbide mononitrate (IMDUR) 60 MG 24 hr tablet TAKE 1 TABLET BY MOUTH EVERY DAY 11/27/22   Alver Sorrow, NP  metoprolol tartrate (LOPRESSOR) 25 MG tablet TAKE 1 TABLET BY MOUTH TWICE A DAY 06/22/22   Jodelle Red, MD  mirtazapine (REMERON SOL-TAB) 15 MG disintegrating tablet Take 15 mg by mouth at bedtime.    [provider]  oxyCODONE ER (XTAMPZA ER) 9 MG C12A Take 1 capsule by mouth 2 (two) times daily as needed. 11/08/22   Etta Grandchild, MD  pantoprazole (PROTONIX) 20 MG tablet Take 1 tablet (20 mg total) by mouth daily. Patient taking differently: Take 20 mg by mouth daily as needed for heartburn or indigestion. 01/08/22   Azucena Fallen, MD  polyvinyl alcohol (LIQUIFILM TEARS) 1.4 % ophthalmic solution Place 1 drop into both eyes daily as needed  for dry eyes.    [provider]  sodium bicarbonate 650 MG tablet Take 2 tablets (1,300 mg total) by mouth 2 (two) times daily. 04/09/22   Ghimire, Werner Lean, MD  solifenacin (VESICARE) 5 MG tablet TAKE 1 TABLET (5 MG TOTAL) BY MOUTH DAILY. 10/02/22   Etta Grandchild, MD  tamsulosin (FLOMAX) 0.4 MG CAPS capsule TAKE 1 CAPSULE (0.4 MG TOTAL) BY MOUTH DAILY. Patient taking differently: Take 0.4 mg by mouth daily. 01/20/14   Etta Grandchild, MD  traMADol (ULTRAM) 50 MG tablet Take 1 tablet (50 mg total) by mouth every 12 (twelve) hours as needed. 09/19/22   Cristie Hem, PA-C  zinc gluconate 50 MG tablet Take 1  tablet (50 mg total) by mouth daily. 08/30/22   Etta Grandchild, MD      Allergies    Ace inhibitors, Gabapentin, Xyzal [levocetirizine], and Opdivo [nivolumab]    Review of Systems   Review of Systems  All other systems reviewed and are negative.   Physical Exam Updated Vital Signs BP (!) 172/113   Pulse (!) 50   Temp 97.8 F (36.6 C) (Oral)   Resp 16   SpO2 100%  Physical Exam Vitals and nursing note reviewed.  Constitutional:      General: He is not in acute distress.    Appearance: He is well-developed.  HENT:     Head: Normocephalic and atraumatic.  Eyes:     Conjunctiva/sclera: Conjunctivae normal.  Cardiovascular:     Rate and Rhythm: Normal rate and regular rhythm.  Pulmonary:     Effort: Pulmonary effort is normal. No respiratory distress.     Breath sounds: Normal breath sounds.  Abdominal:     Palpations: Abdomen is soft.     Tenderness: There is no abdominal tenderness.  Musculoskeletal:        General: No swelling.     Cervical back: Neck supple.  Skin:    General: Skin is warm and dry.     Capillary Refill: Capillary refill takes less than 2 seconds.  Neurological:     Mental Status: He is alert.  Psychiatric:        Mood and Affect: Mood normal.     ED Results / Procedures / Treatments   Labs (all labs ordered are listed, but only abnormal results are displayed) Labs Reviewed  CBC WITH DIFFERENTIAL/PLATELET - Abnormal; Notable for the following components:      Result Value   WBC 3.3 (*)    RBC 4.13 (*)    Hemoglobin 11.7 (*)    HCT 38.0 (*)    RDW 17.4 (*)    All other components within normal limits  COMPREHENSIVE METABOLIC PANEL - Abnormal; Notable for the following components:   Potassium 5.5 (*)    CO2 19 (*)    Glucose, Bld 113 (*)    BUN 78 (*)    Creatinine, Ser 10.95 (*)    Total Protein 6.2 (*)    Albumin 3.3 (*)    GFR, Estimated 4 (*)    All other components within normal limits  MAGNESIUM - Abnormal; Notable for the  following components:   Magnesium 2.6 (*)    All other components within normal limits  TROPONIN I (HIGH SENSITIVITY) - Abnormal; Notable for the following components:   Troponin I (High Sensitivity) 91 (*)    All other components within normal limits    EKG EKG Interpretation Date/Time:  Monday January 22 2023 11:21:08 EDT Ventricular  Rate:  66 PR Interval:  219 QRS Duration:  85 QT Interval:  425 QTC Calculation: 446 R Axis:   24  Text Interpretation: Sinus rhythm Nonspecific T abnormalities, lateral leads No STEMI ST changes anteriorly similar to prior EKG Confirmed by Ernie Avena ([id number]) on 01/22/2023 1:54:07 PM  Radiology No results found.  Procedures Procedures    Medications Ordered in ED Medications  lactated ringers bolus 1,000 mL (0 mLs Intravenous Stopped 01/22/23 1442)    ED Course/ Medical Decision Making/ A&P                                 Medical Decision Making Amount and/or Complexity of Data Reviewed Labs: ordered. Radiology: ordered.    77 year old male with medical history significant for HLD, HTN, GERD, DM2, OSA, prostate cancer, renal cell carcinoma, CVA, CKD stage 5 who presents to the ER with a chief complaint of hypotension. The patient presented initially to [facility] infusion center.  He does not want dialysis. He gets Retacrit injections related to his CKD. At the infusion center his BP was low, reportedly 61/51. He was sent emergently to the ER for management. On arrival, he states that he feels fine. Initial blood pressure and heart rate was normal on arrival.  On arrival, the patient was vitally stable and without complaint. Screening workup initiated due to report of hypotension. IV access was obtained and the patient was given an IVF bolus. His exam revealed clear lungs. He is in no distress. No CP or SOB. He appears euvolemic.   CXR: No disease, specifically no pulmonary edema, PNA or PTX. EKG sinus rhythm,, rate 66, no STEMI.  Labs:  Trop 91, suspect chronically elevated due to his CKD, pt asymptomatic at this time. CBC with leukopenia to 3.3, mild anemia to 11.7. CMP with mild hyperkalemia to 5.5, Cr 10.95 and BUN 78. Pt does not want dialysis in the setting of his known metastatic RCC.   Was going to check a second troponin to ensure it remained flat in the setting of the patient's reported hypotension however the patient remained asymptomatic, was ambulatory in no distress, and the patient was requesting discharge. He did not want further testing. I suspect the the patient's initial blood pressure may have been spurious. Alternately, the patient may have had a vagal episode that has since resolved. Considered mild dehydration. Given his stable vitals over 5 hours, lack of symptoms, and workup that is consistent with his known CKD, I believe the patient is stable for continued palliative management outpatient, which is also his request and preference. Pt discharge in stable condition.  DC Instructions: Please follow-up outpatient with your primary care provider, you likely had slightly low blood pressure from dehydration today.  You are at the point of your renal disease where you would benefit potentially from dialysis however you have declined dialysis in the past.  Your cardiac troponin was elevated but has remained elevated previously and you are currently chest pain free.  This is likely an elevation in the setting of your renal disease.  To the emergency department if you develop any severe chest pain, shortness of breath, change in mental status or any other concerns or complaints.   Final Clinical Impression(s) / ED Diagnoses Final diagnoses:  Dehydration  Hypotension, unspecified hypotension type    Rx / DC Orders ED Discharge Orders     None  Ernie Avena, MD 01/24/23 1221

## 2023-01-25 ENCOUNTER — Encounter (HOSPITAL_COMMUNITY)
Admission: RE | Admit: 2023-01-25 | Discharge: 2023-01-25 | Disposition: A | Discharge status: Home / Self Care | Payer: Medicare Other | Source: Ambulatory Visit | Attending: Nephrology | Admitting: Nephrology

## 2023-01-25 VITALS — BP 152/90 | HR 68 | Temp 97.2°F | Resp 17

## 2023-01-25 DIAGNOSIS — N185 Chronic kidney disease, stage 5: Secondary | ICD-10-CM | POA: Diagnosis not present

## 2023-01-25 LAB — IRON AND TIBC
Iron: 48 ug/dL (ref 45–182)
Saturation Ratios: 20 % (ref 17.9–39.5)
TIBC: 246 ug/dL — ABNORMAL LOW (ref 250–450)
UIBC: 198 ug/dL

## 2023-01-25 LAB — POCT HEMOGLOBIN-HEMACUE: Hemoglobin: 11.3 g/dL — ABNORMAL LOW (ref 13.0–17.0)

## 2023-01-25 LAB — FERRITIN: Ferritin: 406 ng/mL — ABNORMAL HIGH (ref 24–336)

## 2023-01-25 MED ORDER — EPOETIN ALFA-EPBX 40000 UNIT/ML IJ SOLN
INTRAMUSCULAR | Status: AC
  Administered 2023-01-25: 30000 [IU] via SUBCUTANEOUS
  Filled 2023-01-25: qty 1

## 2023-01-25 MED ORDER — EPOETIN ALFA-EPBX 40000 UNIT/ML IJ SOLN: 30000.0000 [IU] | INTRAMUSCULAR | Status: DC

## 2023-01-31 ENCOUNTER — Telehealth: Payer: Self-pay

## 2023-01-31 DIAGNOSIS — E1122 Type 2 diabetes mellitus with diabetic chronic kidney disease: Secondary | ICD-10-CM | POA: Diagnosis not present

## 2023-01-31 DIAGNOSIS — I639 Cerebral infarction, unspecified: Secondary | ICD-10-CM | POA: Diagnosis not present

## 2023-01-31 DIAGNOSIS — D631 Anemia in chronic kidney disease: Secondary | ICD-10-CM | POA: Diagnosis not present

## 2023-01-31 DIAGNOSIS — E875 Hyperkalemia: Secondary | ICD-10-CM | POA: Diagnosis not present

## 2023-01-31 DIAGNOSIS — N2581 Secondary hyperparathyroidism of renal origin: Secondary | ICD-10-CM | POA: Diagnosis not present

## 2023-01-31 DIAGNOSIS — N185 Chronic kidney disease, stage 5: Secondary | ICD-10-CM | POA: Diagnosis not present

## 2023-01-31 DIAGNOSIS — E785 Hyperlipidemia, unspecified: Secondary | ICD-10-CM | POA: Diagnosis not present

## 2023-01-31 DIAGNOSIS — I120 Hypertensive chronic kidney disease with stage 5 chronic kidney disease or end stage renal disease: Secondary | ICD-10-CM | POA: Diagnosis not present

## 2023-01-31 NOTE — Transitions of Care (Post Inpatient/ED Visit) (Signed)
01/31/2023  Name: Antonio Adams MRN: [id number] DOB: 1946-03-27  Today's TOC FU Call Status: Today's TOC FU Call Status:: Successful TOC FU Call Completed TOC FU Call Complete Date: 01/31/23 (Voicemail message received from pt-return call to pt) Patient's Name and Date of Birth confirmed.  Red on EMMI-ED Discharge Alert Date & Reason:01/25/23 "Scheduled follow-up appt? No"   Transition Care Management Follow-up Telephone Call Date of Discharge: 01/25/23 Discharge Facility: [facility]) Type of Discharge: Emergency Department Reason for ED Visit: Other: ("hypotension,unspecified") How have you been since you were released from the hospital?: Same (Pt voices BP is better-unsure of reading lying in bed right now-reports having upset stomach/ posible stoamch bug since yest evening-had some nausea yest after eating soup-has eaten an apple today-no N&V today-decreased appetite-LBM yesterday and normal) Any questions or concerns?: Yes Patient Questions/Concerns:: pt wanting to know what foods to eat to help settle his stomach Patient Questions/Concerns Addressed: Other: (reviewed with pt bland/brat diet)  Items Reviewed: Did you receive and understand the discharge instructions provided?: Yes Medications obtained,verified, and reconciled?: No Medications Not Reviewed Reasons:: Other: (pt not feelign well-lying in bed-meds not in room with him-did not wish to review meds) Any new allergies since your discharge?: No Dietary orders reviewed?: Yes Type of Diet Ordered:: low salt/heart healthy Do you have support at home?: Yes People in Home: significant other Name of Support/Comfort Primary Source: states 'friend' helps him out  Medications Reviewed Today: Medications Reviewed Today     Reviewed by Charlyn Minerva, RN (Registered Nurse) on 01/31/23 at 1327  Med List Status: <None>   Medication Order Taking? Sig Documenting Provider Last Dose Status Informant   acetaminophen (TYLENOL) 500 MG tablet [id number] No Take 1,000 mg by mouth every 6 (six) hours as needed for mild pain. [provider] Taking Active Spouse/Significant Other  atorvastatin (LIPITOR) 80 MG tablet [id number] No Take 1 tablet (80 mg total) by mouth daily. Almon Hercules, MD Taking Active   calcitRIOL (ROCALTROL) 0.25 MCG capsule [id number] No Take 0.25 mcg by mouth daily. [provider] Taking Active Spouse/Significant Other  clopidogrel (PLAVIX) 75 MG tablet [id number] No Take 1 tablet (75 mg total) by mouth daily. Marcos Eke, PA-C Taking Active   Cyanocobalamin (B-12 PO) [id number] No Take 1,000 mcg by mouth daily. [provider] Taking Active Spouse/Significant Other  cyclobenzaprine (FLEXERIL) 5 MG tablet [id number] No Take 1 tablet (5 mg total) by mouth 2 (two) times daily as needed for muscle spasms. Blane Ohara, MD Taking Active Spouse/Significant Other  donepezil (ARICEPT) 10 MG tablet [id number] No Take half tablet (5 mg) daily for 2 weeks, then one tablet daily. Marcos Eke, PA-C Taking Active   doxycycline (VIBRA-TABS) 100 MG tablet [id number]  Take 1 tablet (100 mg total) by mouth 2 (two) times daily. Elenore Paddy, NP  Active   DULoxetine (CYMBALTA) 20 MG capsule [id number] No TAKE 1 CAPSULE BY MOUTH EVERY DAY Etta Grandchild, MD Taking Active   ferrous sulfate 325 (65 FE) MG tablet [id number] No Take 1 tablet (325 mg total) by mouth 2 (two) times daily. Patient needs office visit for further refills  Patient taking differently: Take 325 mg by mouth daily with breakfast.   Hilarie Fredrickson, MD Taking Active Spouse/Significant Other  fluocinonide-emollient (LIDEX-E) 0.05 % cream [id number] No Apply 1 Application topically 2 (two) times daily. Etta Grandchild, MD Taking Active   furosemide (LASIX) 40 MG tablet [id number] No TAKE 1 TABLET BY MOUTH  EVERY DAY Etta Grandchild, MD Taking Active   isosorbide mononitrate (IMDUR) 60 MG 24 hr tablet [id number]  No TAKE 1 TABLET BY MOUTH EVERY DAY Alver Sorrow, NP Taking Active   metoprolol tartrate (LOPRESSOR) 25 MG tablet [id number] No TAKE 1 TABLET BY MOUTH TWICE A DAY Jodelle Red, MD Taking Active   mirtazapine (REMERON SOL-TAB) 15 MG disintegrating tablet [id number] No Take 15 mg by mouth at bedtime. [provider] Taking Active Spouse/Significant Other  oxyCODONE ER (XTAMPZA ER) 9 MG C12A [id number] No Take 1 capsule by mouth 2 (two) times daily as needed. Etta Grandchild, MD Taking Active   pantoprazole (PROTONIX) 20 MG tablet [id number] No Take 1 tablet (20 mg total) by mouth daily.  Patient taking differently: Take 20 mg by mouth daily as needed for heartburn or indigestion.   Azucena Fallen, MD Taking Active Spouse/Significant Other  polyvinyl alcohol (LIQUIFILM TEARS) 1.4 % ophthalmic solution [id number] No Place 1 drop into both eyes daily as needed for dry eyes. [provider] Taking Active Spouse/Significant Other           Med Note (WHITE, [place] May 23, 2021  4:46 AM)    sodium bicarbonate 650 MG tablet [id number] No Take 2 tablets (1,300 mg total) by mouth 2 (two) times daily. Maretta Bees, MD Taking Active   solifenacin (VESICARE) 5 MG tablet [id number] No TAKE 1 TABLET (5 MG TOTAL) BY MOUTH DAILY. Etta Grandchild, MD Taking Active   tamsulosin [facility]) 0.4 MG CAPS capsule [id number] No TAKE 1 CAPSULE (0.4 MG TOTAL) BY MOUTH DAILY.  Patient taking differently: Take 0.4 mg by mouth daily.   Etta Grandchild, MD Taking Active Spouse/Significant Other  traMADol (ULTRAM) 50 MG tablet [id number] No Take 1 tablet (50 mg total) by mouth every 12 (twelve) hours as needed. Cristie Hem, PA-C Taking Active   zinc gluconate 50 MG tablet [id number] No Take 1 tablet (50 mg total) by mouth daily. Etta Grandchild, MD Taking Active             Home Care and Equipment/Supplies: Were Home Health Services Ordered?: NA Any new equipment or medical  supplies ordered?: NA  Functional Questionnaire: Do you need assistance with bathing/showering or dressing?: No Do you need assistance with meal preparation?: No Do you need assistance with eating?: No Do you have difficulty maintaining continence: No Do you need assistance with getting out of bed/getting out of a chair/moving?: No Do you have difficulty managing or taking your medications?: No  Follow up appointments reviewed: PCP Follow-up appointment confirmed?: Yes Date of PCP follow-up appointment?: 02/08/23 Follow-up Provider: Dr. Yetta Barre Specialist [facility] Follow-up appointment confirmed?: Yes Date of Specialist follow-up appointment?: 01/31/23 Follow-Up Specialty Provider:: Dr. Katy Apo vocies he discussed sxs with Md duirng appt today-MD advised him to continue to monitor sxs-no changes in meds Do you need transportation to your follow-up appointment?: No (pt voices he does not drive but his "friend" takes him to appts) Do you understand care options if your condition(s) worsen?: Yes-patient verbalized understanding  SDOH Interventions Today    Flowsheet Row Most Recent Value  SDOH Interventions   Food Insecurity Interventions Intervention Not Indicated  Transportation Interventions Intervention Not Indicated      TOC Interventions Today    Flowsheet Row Most Recent Value  TOC Interventions   TOC Interventions Discussed/Reviewed TOC Interventions Discussed      Interventions Today    Flowsheet Row Most Recent Value  Chronic Disease   Chronic disease during today's visit Hypertension (HTN), Diabetes  General Interventions   General Interventions Discussed/Reviewed General Interventions Discussed, Doctor Visits, Durable Medical Equipment (DME)  Doctor Visits Discussed/Reviewed Doctor Visits Discussed, PCP, Specialist  Durable Medical Equipment (DME) BP Cuff  PCP/Specialist Visits Compliance with follow-up visit  Education Interventions   Education Provided  Provided Education  Provided Verbal Education On Nutrition, When to see the doctor, Sick Day Rules, Medication, Other  [sx mgmt]  Nutrition Interventions   Nutrition Discussed/Reviewed Nutrition Discussed, Fluid intake, Decreasing salt  Pharmacy Interventions   Pharmacy Dicussed/Reviewed Pharmacy Topics Discussed, Medications and their functions  Safety Interventions   Safety Discussed/Reviewed Safety Discussed        Alessandra Grout [facility] Care Management Care Management Community Coordinator Direct Phone: [phone] Toll Free: [phone] Fax: [phone]

## 2023-01-31 NOTE — Transitions of Care (Post Inpatient/ED Visit) (Signed)
01/31/2023  Name: Antonio Adams MRN: [id number] DOB: 06-Nov-1945  Today's TOC FU Call Status: Today's TOC FU Call Status:: Unsuccessful Call (1st Attempt) Unsuccessful Call (1st Attempt) Date: 01/31/23  Attempted to reach the patient regarding the most recent Inpatient/ED visit.  Follow Up Plan: Additional outreach attempts will be made to reach the patient to complete the Transitions of Care (Post Inpatient/ED visit) call.     Antionette Fairy, RN,BSN,CCM [facility] Care Management Care Management Community Coordinator Direct Phone: [phone] Toll Free: [phone] Fax: [phone]

## 2023-02-05 ENCOUNTER — Inpatient Hospital Stay (HOSPITAL_COMMUNITY): Admission: RE | Admit: 2023-02-05 | Payer: Medicare Other | Source: Ambulatory Visit

## 2023-02-06 DIAGNOSIS — Z1212 Encounter for screening for malignant neoplasm of rectum: Secondary | ICD-10-CM | POA: Diagnosis not present

## 2023-02-06 DIAGNOSIS — Z1211 Encounter for screening for malignant neoplasm of colon: Secondary | ICD-10-CM | POA: Diagnosis not present

## 2023-02-07 ENCOUNTER — Ambulatory Visit: Payer: Medicare Other | Admitting: Internal Medicine

## 2023-02-07 ENCOUNTER — Encounter: Payer: Self-pay | Admitting: Internal Medicine

## 2023-02-07 VITALS — BP 124/66 | HR 69 | Temp 98.1°F | Resp 16 | Ht 75.0 in | Wt 180.0 lb

## 2023-02-07 DIAGNOSIS — K5904 Chronic idiopathic constipation: Secondary | ICD-10-CM

## 2023-02-07 DIAGNOSIS — I5032 Chronic diastolic (congestive) heart failure: Secondary | ICD-10-CM | POA: Diagnosis not present

## 2023-02-07 DIAGNOSIS — G893 Neoplasm related pain (acute) (chronic): Secondary | ICD-10-CM

## 2023-02-07 DIAGNOSIS — D631 Anemia in chronic kidney disease: Secondary | ICD-10-CM | POA: Diagnosis not present

## 2023-02-07 DIAGNOSIS — Z23 Encounter for immunization: Secondary | ICD-10-CM | POA: Diagnosis not present

## 2023-02-07 DIAGNOSIS — Z0000 Encounter for general adult medical examination without abnormal findings: Secondary | ICD-10-CM | POA: Diagnosis not present

## 2023-02-07 DIAGNOSIS — N185 Chronic kidney disease, stage 5: Secondary | ICD-10-CM | POA: Diagnosis not present

## 2023-02-07 DIAGNOSIS — Z0001 Encounter for general adult medical examination with abnormal findings: Secondary | ICD-10-CM

## 2023-02-07 LAB — TSH: TSH: 2.05 u[IU]/mL (ref 0.35–5.50)

## 2023-02-07 LAB — MAGNESIUM: Magnesium: 3.4 mg/dL — ABNORMAL HIGH (ref 1.5–2.5)

## 2023-02-07 NOTE — Patient Instructions (Signed)

## 2023-02-07 NOTE — Progress Notes (Signed)
Subjective:  Patient ID: Antonio Adams, male    DOB: [birth year]  Age: 77 y.o. MRN: [id number]  CC: Anemia, Annual Exam, and Constipation   HPI Antonio Adams presents for a CPX and f/up ----   Discussed the use of AI scribe software for clinical note transcription with the patient, who gave verbal consent to proceed.  History of Present Illness   The patient presents with a week-long history of severe constipation, described as passing a 'big stone.' The constipation was associated with significant right-sided abdominal pain. The patient denies bloating but acknowledges a significant amount of discomfort. This episode of constipation was unusual in its severity for him. He has had a BM and feels better.  The patient also reports intermittent nausea, managed with as-needed Zofran. He experienced an episode of vomiting last weekend after consuming soup at a restaurant, but this was attributed to taking medication on an empty stomach. The patient has been experiencing a loss of appetite, leading to weight loss, due to discomfort and a change in taste perception.  He denies any worsening of constipation with oxycodone use. He also receives regular injections for red blood cell production, likely Epogen, due to an unspecified condition.  The patient denies any leg or foot swelling. He also denies any chest pain or shortness of breath. Despite the recent gastrointestinal issues, he reports feeling fine overall.       Outpatient Medications Prior to Visit  Medication Sig Dispense Refill   acetaminophen (TYLENOL) 500 MG tablet Take 1,000 mg by mouth every 6 (six) hours as needed for mild pain.     atorvastatin (LIPITOR) 80 MG tablet Take 1 tablet (80 mg total) by mouth daily. 90 tablet 1   calcitRIOL (ROCALTROL) 0.25 MCG capsule Take 0.25 mcg by mouth daily.     clopidogrel (PLAVIX) 75 MG tablet Take 1 tablet (75 mg total) by mouth daily. 90 tablet 3   Cyanocobalamin (B-12 PO) Take  1,000 mcg by mouth daily.     cyclobenzaprine (FLEXERIL) 5 MG tablet Take 1 tablet (5 mg total) by mouth 2 (two) times daily as needed for muscle spasms. 6 tablet 0   donepezil (ARICEPT) 10 MG tablet Take half tablet (5 mg) daily for 2 weeks, then one tablet daily. 30 tablet 11   DULoxetine (CYMBALTA) 20 MG capsule TAKE 1 CAPSULE BY MOUTH EVERY DAY 90 capsule 2   ferrous sulfate 325 (65 FE) MG tablet Take 1 tablet (325 mg total) by mouth 2 (two) times daily. Patient needs office visit for further refills (Patient taking differently: Take 325 mg by mouth daily with breakfast.) 60 tablet 1   fluocinonide-emollient (LIDEX-E) 0.05 % cream Apply 1 Application topically 2 (two) times daily. 60 g 2   furosemide (LASIX) 40 MG tablet TAKE 1 TABLET BY MOUTH EVERY DAY 90 tablet 0   isosorbide mononitrate (IMDUR) 60 MG 24 hr tablet TAKE 1 TABLET BY MOUTH EVERY DAY 90 tablet 2   metoprolol tartrate (LOPRESSOR) 25 MG tablet TAKE 1 TABLET BY MOUTH TWICE A DAY 180 tablet 3   mirtazapine (REMERON SOL-TAB) 15 MG disintegrating tablet Take 15 mg by mouth at bedtime.     oxyCODONE ER (XTAMPZA ER) 9 MG C12A Take 1 capsule by mouth 2 (two) times daily as needed. 60 capsule 0   pantoprazole (PROTONIX) 20 MG tablet Take 1 tablet (20 mg total) by mouth daily. (Patient taking differently: Take 20 mg by mouth daily as needed for heartburn or indigestion.) 30  tablet 0   polyvinyl alcohol (LIQUIFILM TEARS) 1.4 % ophthalmic solution Place 1 drop into both eyes daily as needed for dry eyes.     sodium bicarbonate 650 MG tablet Take 2 tablets (1,300 mg total) by mouth 2 (two) times daily. 60 tablet 1   solifenacin (VESICARE) 5 MG tablet TAKE 1 TABLET (5 MG TOTAL) BY MOUTH DAILY. 90 tablet 1   tamsulosin (FLOMAX) 0.4 MG CAPS capsule TAKE 1 CAPSULE (0.4 MG TOTAL) BY MOUTH DAILY. (Patient taking differently: Take 0.4 mg by mouth daily.) 30 capsule 1   zinc gluconate 50 MG tablet Take 1 tablet (50 mg total) by mouth daily. 90 tablet 1    doxycycline (VIBRA-TABS) 100 MG tablet Take 1 tablet (100 mg total) by mouth 2 (two) times daily. 20 tablet 0   traMADol (ULTRAM) 50 MG tablet Take 1 tablet (50 mg total) by mouth every 12 (twelve) hours as needed. 30 tablet 2   No facility-administered medications prior to visit.    ROS Review of Systems  Constitutional: Negative.  Negative for appetite change, chills, diaphoresis, fatigue and unexpected weight change.  HENT: Negative.    Eyes: Negative.   Respiratory: Negative.  Negative for cough, chest tightness, shortness of breath and wheezing.   Cardiovascular:  Negative for chest pain, palpitations and leg swelling.  Gastrointestinal:  Positive for constipation, nausea and vomiting. Negative for abdominal pain, anal bleeding, blood in stool and diarrhea.  Endocrine: Negative.   Genitourinary: Negative.  Negative for difficulty urinating.  Musculoskeletal:  Positive for gait problem. Negative for arthralgias and myalgias.  Skin: Negative.   Neurological:  Positive for weakness. Negative for dizziness.  Hematological:  Negative for adenopathy. Does not bruise/bleed easily.  Psychiatric/Behavioral: Negative.      Objective:  BP 124/66 (BP Location: Right Arm, Patient Position: Sitting, Cuff Size: Large)   Pulse 69   Temp 98.1 F (36.7 C) (Oral)   Resp 16   Ht 6\' 3"  (1.905 m)   Wt 180 lb (81.6 kg)   SpO2 92%   BMI 22.50 kg/m   BP Readings from Last 3 Encounters:  02/08/23 (!) 149/83  02/07/23 124/66  01/25/23 (!) 152/90    Wt Readings from Last 3 Encounters:  02/07/23 180 lb (81.6 kg)  01/12/23 184 lb (83.5 kg)  01/02/23 184 lb (83.5 kg)    Physical Exam Vitals reviewed.  Constitutional:      General: He is not in acute distress.    Appearance: Normal appearance. He is not ill-appearing, toxic-appearing or diaphoretic.  HENT:     Nose: Nose normal.     Mouth/Throat:     Mouth: Mucous membranes are moist.  Eyes:     General: No scleral icterus.     Conjunctiva/sclera: Conjunctivae normal.  Cardiovascular:     Rate and Rhythm: Normal rate and regular rhythm.     Heart sounds: Murmur heard.     Systolic murmur is present with a grade of 3/6.     No friction rub. No gallop.  Pulmonary:     Effort: Pulmonary effort is normal.     Breath sounds: No stridor. No wheezing, rhonchi or rales.  Abdominal:     General: Abdomen is flat. Bowel sounds are decreased.     Palpations: There is no hepatomegaly, splenomegaly or mass.     Tenderness: There is no abdominal tenderness.  Musculoskeletal:        General: Normal range of motion.     Right lower leg:  No edema.     Left lower leg: No edema.  Skin:    General: Skin is warm and dry.     Findings: No rash.  Neurological:     General: No focal deficit present.     Mental Status: He is alert. Mental status is at baseline.  Psychiatric:        Mood and Affect: Mood normal.        Behavior: Behavior normal.     Lab Results  Component Value Date   WBC 3.3 (L) 01/22/2023   HGB 11.1 (L) 02/08/2023   HCT 38.0 (L) 01/22/2023   PLT 191 01/22/2023   GLUCOSE 113 (H) 01/22/2023   CHOL 227 (H) 08/25/2022   TRIG 79 08/25/2022   HDL 47 08/25/2022   LDLCALC 164 (H) 08/25/2022   ALT 26 01/22/2023   AST 30 01/22/2023   NA 137 01/22/2023   K 5.5 (H) 01/22/2023   CL 105 01/22/2023   CREATININE 10.95 (H) 01/22/2023   BUN 78 (H) 01/22/2023   CO2 19 (L) 01/22/2023   TSH 2.05 02/07/2023   PSA 0.01 (L) 05/20/2020   INR 1.1 10/30/2022   HGBA1C 5.4 08/25/2022   MICROALBUR 97.0 12/18/2019    No results found.  Assessment & Plan:   Encounter for general adult medical examination with abnormal findings - Exam completed, labs reviewed, vaccines reviewed and updated, cancer screenings are not indicated, pt ed material was given.   Chronic idiopathic constipation- Labs are negative for secondary causes. -     Magnesium; Future -     TSH; Future  Anemia due to stage 5 chronic kidney disease,  not on chronic dialysis [facility])- He is not willing to do HD.  Chronic heart failure with preserved ejection fraction (HFpEF) (HCC) - He has a normal volume status.  Cancer related pain - Will continue oxycodone as needed.  Flu vaccine need -     Flu Vaccine Trivalent High Dose (Fluad)     Follow-up: Return in about 6 months (around 08/07/2023), or if symptoms worsen or fail to improve.  Sanda Linger, MD

## 2023-02-07 NOTE — Progress Notes (Signed)
Thyroid levels are normal, thanks

## 2023-02-08 ENCOUNTER — Encounter (HOSPITAL_COMMUNITY)
Admission: RE | Admit: 2023-02-08 | Discharge: 2023-02-08 | Disposition: A | Discharge status: Home / Self Care | Payer: Medicare Other | Source: Ambulatory Visit | Attending: Nephrology | Admitting: Nephrology

## 2023-02-08 VITALS — BP 149/83 | HR 63 | Temp 97.7°F | Resp 16

## 2023-02-08 DIAGNOSIS — N185 Chronic kidney disease, stage 5: Secondary | ICD-10-CM | POA: Diagnosis not present

## 2023-02-08 LAB — POCT HEMOGLOBIN-HEMACUE: Hemoglobin: 11.1 g/dL — ABNORMAL LOW (ref 13.0–17.0)

## 2023-02-08 MED ORDER — EPOETIN ALFA-EPBX 40000 UNIT/ML IJ SOLN
30000.0000 [IU] | INTRAMUSCULAR | Status: DC
  Administered 2023-02-08: 30000 [IU] via SUBCUTANEOUS

## 2023-02-08 MED ORDER — EPOETIN ALFA-EPBX 40000 UNIT/ML IJ SOLN
INTRAMUSCULAR | Status: AC
  Filled 2023-02-08: qty 1

## 2023-02-12 LAB — COLOGUARD: COLOGUARD: POSITIVE — AB

## 2023-02-15 ENCOUNTER — Encounter: Payer: Self-pay | Admitting: Sports Medicine

## 2023-02-15 ENCOUNTER — Ambulatory Visit: Payer: Medicare Other | Admitting: Sports Medicine

## 2023-02-15 DIAGNOSIS — M19012 Primary osteoarthritis, left shoulder: Secondary | ICD-10-CM

## 2023-02-15 DIAGNOSIS — M12812 Other specific arthropathies, not elsewhere classified, left shoulder: Secondary | ICD-10-CM

## 2023-02-15 NOTE — Progress Notes (Signed)
Patient says that his shoulder is better than it was but it still bothers him. Location of pain is the same. He says that it is keeping him up some nights; patient sleeps on his back.

## 2023-02-15 NOTE — Progress Notes (Signed)
Antonio Adams - 77 y.o. male MRN [id number]  Date of birth: [birth year]  Office Visit Note: Visit Date: 02/15/2023 PCP: Etta Grandchild, MD Referred by: Etta Grandchild, MD  Subjective: Chief Complaint  Patient presents with   Left Shoulder - Follow-up, Pain   HPI: Antonio Adams is a pleasant 77 y.o. male who presents today for follow-up of chronic left shoulder pain.  He has had shoulder pain for many years. About 1 month ago we did proceed with ultrasound-guided left glenohumeral joint injection, patient feels like this was certainly helpful, improved his pain by about 75%.   He does have tramadol 50 mg he takes twice daily as needed for breakthrough pain only - this didn't work as much so he does have Oxycodone ER 9mg  to take for breakthrough pain.   Pertinent ROS were reviewed with the patient and found to be negative unless otherwise specified above in HPI.   Assessment & Plan: Visit Diagnoses:  1. Primary osteoarthritis, left shoulder   2. Rotator cuff arthropathy of left shoulder    Plan: Discussed with Leonette Most the nature of his chronic left shoulder pain, which is severe osteoarthritis of the left shoulder, likely has some rotator cuff arthropathy/tendinopathy that is contributing as well.  He received fairly good relief but at least 75% of his pain with the ultrasound-guided glenohumeral joint injection.  To further improve his pain and maintain his range of motion, I would like him to get started in formalized physical therapy for the left shoulder first work on range of motion and then strengthening/stability.  Referral was sent today.  He may continue his oxycodone ER 9 mg as needed for breakthrough pain.  He will follow-up with me in about 6 weeks for reevaluation.  If not significantly better, may need to have an MRI to evaluate the quality of the rotator cuff. Surgery in terms of reverse total shoulder or total shoulder arthroplasty is not off the table if not  improving with above and infrequent injections.  Follow-up: Return in about 6 weeks (around 03/29/2023) for left shoulder OA.   Meds & Orders: No orders of the defined types were placed in this encounter.   Orders Placed This Encounter  Procedures   Ambulatory referral to Physical Therapy     Procedures: No procedures performed      Clinical History: No specialty comments available.  He reports that he has never smoked. He has never used smokeless tobacco.  Recent Labs    04/08/22 0321 08/25/22 0647  HGBA1C 5.4 5.4    Objective:   Vital Signs: There were no vitals taken for this visit.  Physical Exam  Gen: Well-appearing, in no acute distress; non-toxic CV:  Well-perfused. Warm.  Resp: Breathing unlabored on room air; no wheezing. Psych: Fluid speech in conversation; appropriate affect; normal thought process Neuro: Sensation intact throughout. No gross coordination deficits.   Ortho Exam - Left shoulder: Mild TTP with deep palpation within the anterior glenohumeral joint, no AC joint TTP.  Forward flexion to approximate 175 degrees, internal rotation to L3 compared to L1 on the contralateral arm.  No significant weakness with rotator cuff testing, mild pain with resisted empty can.  Imaging:  N/a  Past Medical/Family/Surgical/Social History: Medications & Allergies reviewed per EMR, new medications updated. Patient Active Problem List   Diagnosis Date Noted   Chronic idiopathic constipation 02/07/2023   Bronchitis 01/12/2023   Chronic left shoulder pain 01/04/2023   Subacute cough 01/02/2023   Abnormal  weight loss 01/02/2023   Encounter for palliative care involving management of pain 11/02/2022   Encounter for general adult medical examination with abnormal findings 01/23/2022   Anemia due to zinc deficiency 12/20/2021   Cancer related pain 12/15/2021   Chronic heart failure with preserved ejection fraction (HFpEF) (HCC) 08/12/2021   Tinea cruris 08/12/2021    Anemia due to chronic kidney disease 07/20/2021   CKD (chronic kidney disease) stage 5, GFR less than 15 ml/min (HCC) 05/24/2021   BPH (benign prostatic hyperplasia) 05/24/2021   Senile nuclear sclerosis 05/20/2021   Vitamin D deficiency 05/20/2021   Vitreous degeneration, unspecified eye 05/20/2021   Intrinsic eczema 04/18/2021   Atherosclerosis of aorta (HCC) 12/30/2020   Mixed conductive and sensorineural hearing loss of left ear with restricted hearing of right ear 09/01/2020   Metastatic renal cell carcinoma (HCC) 02/26/2020   Diabetic polyneuropathy associated with type 2 diabetes mellitus (HCC) 12/18/2019   Murmur, cardiac 10/08/2018   DDD (degenerative disc disease), cervical 10/08/2018   OAB (overactive bladder) 04/04/2018   Seasonal allergic rhinitis due to pollen 11/01/2016   Spinal stenosis of lumbar region with radiculopathy 10/30/2011    Class: Chronic   Obstructive sleep apnea 11/24/2010   Cognitive impairment 11/24/2010   ADENOCARCINOMA, PROSTATE, GLEASON GRADE 6 12/21/2009   Essential hypertension 03/16/2007   GERD 03/16/2007   Past Medical History:  Diagnosis Date   ADENOCARCINOMA, PROSTATE, GLEASON GRADE 6 12/21/2009   prostate cancer   ALLERGIC RHINITIS 03/16/2007   Allergy    Anxiety    Arthritis    back    BACK PAIN WITH RADICULOPATHY 11/23/2008   Cancer of kidney (HCC)    partial right kidney removed   CARPAL TUNNEL SYNDROME, BILATERAL 03/16/2007   Chronic back pain    Constipation    takes Senokot daily   Depression    occasionally   DIABETES MELLITUS, TYPE II 03/16/2007   takes Januvia and MEtformin daily   GASTROINTESTINAL HEMORRHAGE, HX OF 03/16/2007   GERD 03/16/2007   takes Omeprazole daily   Glaucoma    mild - no eye drops   Hemorrhoids    History of colon polyps    HYPERLIPIDEMIA 03/16/2007   takes Crestor daily   HYPERTENSION 03/16/2007   takes Diltiazem and Lisinopril daily    Kidney cancer, primary, with metastasis from kidney to other  site [facility])    LEG CRAMPS 05/22/2007   Nocturia    Overactive bladder    PARESTHESIA 05/21/2007   Proctitis    PTSD (post-traumatic stress disorder)    wakes up may be dreaming of fighting   Rectal bleeding    Dr.Norins has explained its from the Radiation that he has received   Rectal bleeding    Renal cell carcinoma (HCC)    Sleep apnea    uses CPAP nightly   Stomach cancer (HCC)    Urinary frequency    takes Toviaz daily   Family History  Problem Relation Age of Onset   Cancer Sister 30       Cancer, unsure type   Hypertension Other    Colon cancer Neg Hx    Esophageal cancer Neg Hx    Stomach cancer Neg Hx    Diabetes Neg Hx    Colon polyps Neg Hx    Rectal cancer Neg Hx    Past Surgical History:  Procedure Laterality Date   ADRENALECTOMY Right 02/26/2020   Procedure: OPEN RIGHT RENAL CELL METASTATECTOMY AND  ADRENALECTOMY;  Surgeon: Berniece Salines  W, MD;  Location: [facility];  Service: Urology;  Laterality: Right;   BACK SURGERY  1983, 2008   repeat surgery; ESI '08   CARPAL TUNNEL RELEASE     bilateral   CERVICAL FUSION     COLONOSCOPY     colonosocpy     ESOPHAGOGASTRODUODENOSCOPY     ED   FLEXIBLE SIGMOIDOSCOPY N/A 01/08/2017   Procedure: FLEXIBLE SIGMOIDOSCOPY;  Surgeon: Hilarie Fredrickson, MD;  Location: [facility] ENDOSCOPY;  Service: Endoscopy;  Laterality: N/A;   HOT HEMOSTASIS N/A 01/08/2017   Procedure: HOT HEMOSTASIS (ARGON PLASMA COAGULATION/BICAP);  Surgeon: Hilarie Fredrickson, MD;  Location: [facility] ENDOSCOPY;  Service: Endoscopy;  Laterality: N/A;   IR RADIOLOGIST EVAL & MGMT  12/17/2019   IR RADIOLOGIST EVAL & MGMT  07/01/2020   IR RADIOLOGIST EVAL & MGMT  08/25/2020   LUMBAR LAMINECTOMY  10/30/2011   Procedure: MICRODISCECTOMY LUMBAR LAMINECTOMY;  Surgeon: Kerrin Champagne, MD;  Location: [facility] OR;  Service: Orthopedics;  Laterality: Right;  Right L4-5 and L5-S1 Microdiscectomy   LUMBAR LAMINECTOMY  06/17/2012   Procedure: MICRODISCECTOMY LUMBAR LAMINECTOMY;  Surgeon: Kerrin Champagne,  MD;  Location: [facility] OR;  Service: Orthopedics;  Laterality: N/A;  Right L5-S1 microdiscectomy   OPEN PARTIAL HEPATECTOMY  N/A 02/26/2020   Procedure: OPEN PARTIAL HEPATECTOMY, CHOLECYSTECTOMY;  Surgeon: Almond Lint, MD;  Location: [facility];  Service: General;  Laterality: N/A;   POLYPECTOMY     PROSTATE SURGERY  march 2012   seed implant   RADIOFREQUENCY ABLATION N/A 07/21/2020   Procedure: CT MICROWAVE ABLATION;  Surgeon: Sterling Big, MD;  Location: [facility];  Service: Anesthesiology;  Laterality: N/A;   ROBOTIC ASSITED PARTIAL NEPHRECTOMY Right 09/08/2015   Procedure: XI ROBOTIC ASSITED RIGHT PARTIAL NEPHRECTOMY;  Surgeon: Crist Fat, MD;  Location: [facility];  Service: Urology;  Laterality: Right;  Clamp on: 1033 Clamp off: 1103 Total Clamp Time: 30 minutes   SHOULDER ARTHROSCOPY WITH SUBACROMIAL DECOMPRESSION Left 01/20/2016   Procedure: LEFT SHOULDER ARTHROSCOPY WITH EXTENSIVE  DEBRIDEMENT, ACROMIOPLASTY;  Surgeon: Loreta Ave, MD;  Location: [facility];  Service: Orthopedics;  Laterality: Left;   SHOULDER SURGERY  LFT   Stress Cardiolite  12/24/2003   negative for ischemia   UPPER ESOPHAGEAL ENDOSCOPIC ULTRASOUND (EUS)  04/18/2016   [facility]   UPPER GASTROINTESTINAL ENDOSCOPY     WOUND EXPLORATION     management of a wound that he sustained in the [employer]   Social History   Occupational History   Occupation: Patent examiner; Chief Strategy Officer: RETIRED    Comment: retired from Patent examiner  Tobacco Use   Smoking status: Never   Smokeless tobacco: Never  Vaping Use   Vaping status: Never Used  Substance and Sexual Activity   Alcohol use: Not Currently    Alcohol/week: 0.0 standard drinks of alcohol   Drug use: No   Sexual activity: Not Currently    Partners: Female

## 2023-02-19 ENCOUNTER — Encounter (HOSPITAL_COMMUNITY): Payer: Medicare Other

## 2023-02-22 ENCOUNTER — Encounter (HOSPITAL_COMMUNITY)
Admission: RE | Admit: 2023-02-22 | Discharge: 2023-02-22 | Disposition: A | Discharge status: Home / Self Care | Payer: Medicare Other | Source: Ambulatory Visit | Attending: Nephrology | Admitting: Nephrology

## 2023-02-22 VITALS — BP 159/92 | HR 99 | Temp 97.3°F | Resp 17

## 2023-02-22 DIAGNOSIS — N185 Chronic kidney disease, stage 5: Secondary | ICD-10-CM | POA: Diagnosis not present

## 2023-02-22 LAB — POCT HEMOGLOBIN-HEMACUE: Hemoglobin: 10.9 g/dL — ABNORMAL LOW (ref 13.0–17.0)

## 2023-02-22 LAB — IRON AND TIBC
Iron: 43 ug/dL — ABNORMAL LOW (ref 45–182)
Saturation Ratios: 18 % (ref 17.9–39.5)
TIBC: 239 ug/dL — ABNORMAL LOW (ref 250–450)
UIBC: 196 ug/dL

## 2023-02-22 LAB — FERRITIN: Ferritin: 379 ng/mL — ABNORMAL HIGH (ref 24–336)

## 2023-02-22 MED ORDER — EPOETIN ALFA-EPBX 40000 UNIT/ML IJ SOLN
INTRAMUSCULAR | Status: AC
  Filled 2023-02-22: qty 1

## 2023-02-22 MED ORDER — EPOETIN ALFA-EPBX 40000 UNIT/ML IJ SOLN
30000.0000 [IU] | INTRAMUSCULAR | Status: DC
  Administered 2023-02-22: 30000 [IU] via SUBCUTANEOUS

## 2023-03-01 ENCOUNTER — Encounter: Payer: Self-pay | Admitting: Physical Therapy

## 2023-03-01 ENCOUNTER — Ambulatory Visit: Payer: Medicare Other | Admitting: Physical Therapy

## 2023-03-01 ENCOUNTER — Ambulatory Visit: Payer: Medicare Other | Admitting: Physician Assistant

## 2023-03-01 ENCOUNTER — Other Ambulatory Visit: Payer: Self-pay

## 2023-03-01 DIAGNOSIS — M25512 Pain in left shoulder: Secondary | ICD-10-CM

## 2023-03-01 DIAGNOSIS — M6281 Muscle weakness (generalized): Secondary | ICD-10-CM | POA: Diagnosis not present

## 2023-03-01 DIAGNOSIS — G8929 Other chronic pain: Secondary | ICD-10-CM

## 2023-03-01 NOTE — Therapy (Signed)
OUTPATIENT PHYSICAL THERAPY SHOULDER EVALUATION   Patient Name: Antonio Adams MRN: [id number] DOB:1945/09/13, 77 y.o., male Today's Date: 03/01/2023  END OF SESSION:  PT End of Session - 03/01/23 1010     Visit Number 1    Number of Visits 8    Date for PT Re-Evaluation 04/12/23    Authorization Type [employer] Medicare    Progress Note Due on Visit 10    PT Start Time 0930    PT Stop Time 1002    PT Time Calculation (min) 32 min    Activity Tolerance Patient tolerated treatment well    Behavior During Therapy WFL for tasks assessed/performed             Past Medical History:  Diagnosis Date   ADENOCARCINOMA, PROSTATE, GLEASON GRADE 6 12/21/2009   prostate cancer   ALLERGIC RHINITIS 03/16/2007   Allergy    Anxiety    Arthritis    back    BACK PAIN WITH RADICULOPATHY 11/23/2008   Cancer of kidney (HCC)    partial right kidney removed   CARPAL TUNNEL SYNDROME, BILATERAL 03/16/2007   Chronic back pain    Constipation    takes Senokot daily   Depression    occasionally   DIABETES MELLITUS, TYPE II 03/16/2007   takes Januvia and MEtformin daily   GASTROINTESTINAL HEMORRHAGE, HX OF 03/16/2007   GERD 03/16/2007   takes Omeprazole daily   Glaucoma    mild - no eye drops   Hemorrhoids    History of colon polyps    HYPERLIPIDEMIA 03/16/2007   takes Crestor daily   HYPERTENSION 03/16/2007   takes Diltiazem and Lisinopril daily    Kidney cancer, primary, with metastasis from kidney to other site [facility])    LEG CRAMPS 05/22/2007   Nocturia    Overactive bladder    PARESTHESIA 05/21/2007   Proctitis    PTSD (post-traumatic stress disorder)    wakes up may be dreaming of fighting   Rectal bleeding    Dr.Norins has explained its from the Radiation that he has received   Rectal bleeding    Renal cell carcinoma (HCC)    Sleep apnea    uses CPAP nightly   Stomach cancer (HCC)    Urinary frequency    takes Toviaz daily   Past Surgical History:  Procedure Laterality Date    ADRENALECTOMY Right 02/26/2020   Procedure: OPEN RIGHT RENAL CELL METASTATECTOMY AND  ADRENALECTOMY;  Surgeon: Crist Fat, MD;  Location: [facility];  Service: Urology;  Laterality: Right;   BACK SURGERY  1983, 2008   repeat surgery; ESI '08   CARPAL TUNNEL RELEASE     bilateral   CERVICAL FUSION     COLONOSCOPY     colonosocpy     ESOPHAGOGASTRODUODENOSCOPY     ED   FLEXIBLE SIGMOIDOSCOPY N/A 01/08/2017   Procedure: FLEXIBLE SIGMOIDOSCOPY;  Surgeon: Hilarie Fredrickson, MD;  Location: [facility] ENDOSCOPY;  Service: Endoscopy;  Laterality: N/A;   HOT HEMOSTASIS N/A 01/08/2017   Procedure: HOT HEMOSTASIS (ARGON PLASMA COAGULATION/BICAP);  Surgeon: Hilarie Fredrickson, MD;  Location: [facility] ENDOSCOPY;  Service: Endoscopy;  Laterality: N/A;   IR RADIOLOGIST EVAL & MGMT  12/17/2019   IR RADIOLOGIST EVAL & MGMT  07/01/2020   IR RADIOLOGIST EVAL & MGMT  08/25/2020   LUMBAR LAMINECTOMY  10/30/2011   Procedure: MICRODISCECTOMY LUMBAR LAMINECTOMY;  Surgeon: Kerrin Champagne, MD;  Location: [facility] OR;  Service: Orthopedics;  Laterality: Right;  Right L4-5 and L5-S1  Microdiscectomy   LUMBAR LAMINECTOMY  06/17/2012   Procedure: MICRODISCECTOMY LUMBAR LAMINECTOMY;  Surgeon: Kerrin Champagne, MD;  Location: [facility] OR;  Service: Orthopedics;  Laterality: N/A;  Right L5-S1 microdiscectomy   OPEN PARTIAL HEPATECTOMY  N/A 02/26/2020   Procedure: OPEN PARTIAL HEPATECTOMY, CHOLECYSTECTOMY;  Surgeon: Almond Lint, MD;  Location: [facility];  Service: General;  Laterality: N/A;   POLYPECTOMY     PROSTATE SURGERY  march 2012   seed implant   RADIOFREQUENCY ABLATION N/A 07/21/2020   Procedure: CT MICROWAVE ABLATION;  Surgeon: Sterling Big, MD;  Location: [facility];  Service: Anesthesiology;  Laterality: N/A;   ROBOTIC ASSITED PARTIAL NEPHRECTOMY Right 09/08/2015   Procedure: XI ROBOTIC ASSITED RIGHT PARTIAL NEPHRECTOMY;  Surgeon: Crist Fat, MD;  Location: [facility];  Service: Urology;  Laterality: Right;  Clamp on: 1033 Clamp off: 1103 Total  Clamp Time: 30 minutes   SHOULDER ARTHROSCOPY WITH SUBACROMIAL DECOMPRESSION Left 01/20/2016   Procedure: LEFT SHOULDER ARTHROSCOPY WITH EXTENSIVE  DEBRIDEMENT, ACROMIOPLASTY;  Surgeon: Loreta Ave, MD;  Location: [facility];  Service: Orthopedics;  Laterality: Left;   SHOULDER SURGERY  LFT   Stress Cardiolite  12/24/2003   negative for ischemia   UPPER ESOPHAGEAL ENDOSCOPIC ULTRASOUND (EUS)  04/18/2016   [facility]   UPPER GASTROINTESTINAL ENDOSCOPY     WOUND EXPLORATION     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   Patient Active Problem List   Diagnosis Date Noted   Chronic idiopathic constipation 02/07/2023   Bronchitis 01/12/2023   Chronic left shoulder pain 01/04/2023   Subacute cough 01/02/2023   Abnormal weight loss 01/02/2023   Encounter for palliative care involving management of pain 11/02/2022   Encounter for general adult medical examination with abnormal findings 01/23/2022   Anemia due to zinc deficiency 12/20/2021   Cancer related pain 12/15/2021   Chronic heart failure with preserved ejection fraction (HFpEF) (HCC) 08/12/2021   Tinea cruris 08/12/2021   Anemia due to chronic kidney disease 07/20/2021   CKD (chronic kidney disease) stage 5, GFR less than 15 ml/min (HCC) 05/24/2021   BPH (benign prostatic hyperplasia) 05/24/2021   Senile nuclear sclerosis 05/20/2021   Vitamin D deficiency 05/20/2021   Vitreous degeneration, unspecified eye 05/20/2021   Intrinsic eczema 04/18/2021   Atherosclerosis of aorta (HCC) 12/30/2020   Mixed conductive and sensorineural hearing loss of left ear with restricted hearing of right ear 09/01/2020   Metastatic renal cell carcinoma (HCC) 02/26/2020   Diabetic polyneuropathy associated with type 2 diabetes mellitus (HCC) 12/18/2019   Murmur, cardiac 10/08/2018   DDD (degenerative disc disease), cervical 10/08/2018   OAB (overactive bladder) 04/04/2018   Seasonal allergic rhinitis due to pollen  11/01/2016   Spinal stenosis of lumbar region with radiculopathy 10/30/2011    Class: Chronic   Obstructive sleep apnea 11/24/2010   Cognitive impairment 11/24/2010   ADENOCARCINOMA, PROSTATE, GLEASON GRADE 6 12/21/2009   Essential hypertension 03/16/2007   GERD 03/16/2007    PCP: Etta Grandchild, MD   REFERRING PROVIDER: Madelyn Brunner, DO  REFERRING DIAG: [phone] (ICD-10-CM) - Primary osteoarthritis, left shoulder M12.812 (ICD-10-CM) - Rotator cuff arthropathy of left shoulder  THERAPY DIAG:  Chronic left shoulder pain  Muscle weakness (generalized)  Rationale for Evaluation and Treatment: Rehabilitation  ONSET DATE: Chronic Lt shoulder pain since he was in [employer], previous Lt shoulder scope with SAD and extensive debridement in 2017  SUBJECTIVE:  SUBJECTIVE STATEMENT:He says chronic shoulder pain since he messed it up in his military service, previous left shoulder surgery and did get some better but still with pain. Did have CVA last year.  Hand dominance: Right  PERTINENT HISTORY:  PMH: ESRD not on HD, renal cancer metastatic to liver, adrenals and lymph nodes no longer on chemo, GERD, L PCA infarct November 2023, DM2, and anemia.   PAIN:  NPRS scale: 5-6/10 upon arrival Pain location:deep in the joint/bone, anterior and lateral Pain description: constant at night, can be sharp, can be dull Aggravating factors: using his arm Relieving factors: injections, heat   PRECAUTIONS: None  RED FLAGS: None   WEIGHT BEARING RESTRICTIONS: No  FALLS:  Has patient fallen in last 6 months? No  OCCUPATION: retired  PLOF: Independent with basic ADLs, does need help for any lifting or carrying  PATIENT GOALS:try to get his shoulder better  NEXT MD VISIT: nothing scheduled at eval  OBJECTIVE:   Note: Objective measures were completed at Evaluation unless otherwise noted.  DIAGNOSTIC FINDINGS:  X-rays show significant  advanced osteoarthritis of the glenohumeral joint.  There is some calcification noted lateral and superior to the greater tuberosity as well as over the [facility] joint, possibly indicative of chondrocalcinosis.  There is notable acetabular and humeral head sclerosis with a bony cyst over the  medial humeral head.  No acute fracture noted.   PATIENT SURVEYS:  Eval:FOTO 50% functional, goal is 65%   UPPER EXTREMITY ROM:   Active ROM/PROM Right eval Left eval  Shoulder flexion WFL 85/WFL  Shoulder extension    Shoulder abduction [facility] 90/WFL  Shoulder adduction    Shoulder internal rotation  WFL/WFL  Shoulder external rotation  50/WFL  Elbow flexion    Elbow extension    Wrist flexion    Wrist extension    Wrist ulnar deviation    Wrist radial deviation    Wrist pronation    Wrist supination    (Blank rows = not tested)  UPPER EXTREMITY MMT:  MMT Left eval   Shoulder flexion 2   Shoulder extension    Shoulder abduction 2   Shoulder adduction    Shoulder internal rotation 4+   Shoulder external rotation 3+   Middle trapezius    Lower trapezius    Elbow flexion 5   Elbow extension 5   Wrist flexion    Wrist extension    Wrist ulnar deviation    Wrist radial deviation    Wrist pronation    Wrist supination    Grip strength (lbs)    (Blank rows = not tested)  SHOULDER SPECIAL TESTS: Impingement tests: Neer impingement test: positive  and Hawkins/Kennedy impingement test: positive  Rotator cuff assessment: Drop arm test: positive    TODAY'S TREATMENT:  Eval HEP creation and review with demonstration and education, see below for details  Moist heat X  10 min to left shoulder   PATIENT EDUCATION: Education details: HEP, PT plan of care Person educated: Patient Education method: Explanation, Demonstration, Verbal cues, and Handouts Education  comprehension: verbalized understanding and needs further education   HOME EXERCISE PROGRAM: Access Code: [email or web address] URL: [email or web address] Date: 03/01/2023 Prepared by: Ivery Quale  Exercises - Standing Shoulder Posterior Capsule Stretch  - 2 x daily - 6 x weekly - 1 sets - 10 reps - Standing Shoulder Flexion Wall Slide  - 2 x daily - 6 x weekly - 1 sets - 10 reps - Standing Shoulder Abduction Slides at Wall (Mirrored)  -  2 x daily - 6 x weekly - 1 sets - 10 reps - Standing Shoulder Row with Anchored Resistance  - 2 x daily - 6 x weekly - 2-3 sets - 10 reps - Shoulder Extension with Resistance - Neutral  - 2 x daily - 6 x weekly - 2-3 sets - 10 reps - Shoulder External Rotation with Anchored Resistance  - 2 x daily - 6 x weekly - 2-3 sets - 10 reps - Shoulder Internal Rotation with Resistance (Mirrored)  - 2 x daily - 6 x weekly - 2-3 sets - 10 reps  ASSESSMENT:  CLINICAL IMPRESSION: Patient referred to PT for chronic left shoulder pain, severe osteoarthritis of the left shoulder, likely has some rotator cuff arthropathy/tendinopathy that is contributing as well. He does have + drop arm test so it is possible he has RTC tear. Patient will benefit from skilled PT to address below impairments, limitations and improve overall function. He wants to trial HEP first and will return to PT if needed. I did write out PT plan of care anyway in the event he wishes to return for treatment.  OBJECTIVE IMPAIRMENTS: decreased activity tolerance, decreased shoulder mobility, decreased ROM, decreased strength, impaired flexibility, impaired UE use, postural dysfunction, and pain.  ACTIVITY LIMITATIONS: reaching, lifting, carry,  cleaning, driving,  PERSONAL FACTORS: see above PMH also affecting patient's functional outcome.  REHAB POTENTIAL: Good  CLINICAL DECISION MAKING: Stable/uncomplicated  EVALUATION COMPLEXITY: Low    GOALS: Short term PT Goals Target date:  03/29/2023   Pt will be I and compliant with HEP. Baseline:  Goal status: New Pt will decrease pain by 25% overall Baseline: Goal status: New  Long term PT goals Target date:04/26/2023   Pt will improve Lt shoulder AROM to [facility] to improve functional reaching Baseline: Goal status: New Pt will improve  Rt shoulder strength to at least 4+/5 MMT to improve functional strength Baseline: Goal status: New Pt will improve FOTO to at least 65% functional to show improved function Baseline: Goal status: New Pt will reduce pain to overall less than 3/10 with usual activity and work activity. Baseline: Goal status: New  PLAN: PT FREQUENCY: 1-2 times per week   PT DURATION: 6-8 weeks  PLANNED INTERVENTIONS (unless contraindicated): aquatic PT, Canalith repositioning, cryotherapy, Electrical stimulation, Iontophoresis with 4 mg/ml dexamethasome, Moist heat, traction, Ultrasound, gait training, Therapeutic exercise, balance training, neuromuscular re-education, patient/family education, prosthetic training, manual techniques, passive ROM, dry needling, taping, vasopnuematic device, vestibular, spinal manipulations, joint manipulations  PLAN FOR NEXT SESSION: review HEP, RTC strengthening and if no improvement refer back to MD as PT can not rule out RTC tear.   PLANNED INTERVENTIONS: 97110-Therapeutic exercises, 97530- Therapeutic activity, [id number]- Neuromuscular re-education, 97535- Self Care, [id number]- Manual therapy, 97014- Electrical stimulation (unattended), and 97035- Ultrasound  Date of referral: 02/15/23 Referring provider: Madelyn Brunner, DO Referring diagnosis? M19.012 (ICD-10-CM) - Primary osteoarthritis, left shoulder M12.812 (ICD-10-CM) - Rotator cuff arthropathy of left shoulder Treatment diagnosis? (if different than referring diagnosis)  M25.512 What was this (referring dx) caused by? Arthritis  Nature of Condition: Chronic (continuous duration > 3 months)   Laterality:  Lt  Current Functional Measure Score: FOTO 50% functional, goal is 65%  Objective measurements identify impairments when they are compared to normal values, the uninvolved extremity, and prior level of function.  [x]  Yes  []  No  Objective assessment of functional ability: Severe functional limitations   Briefly describe symptoms: Pt is unable to raise his arm past 90 degrees due  to pain and weakness  How did symptoms start: chronic condition, has severe OA, has previous shoulder surgery  Average pain intensity:  Last 24 hours: 5/10  Past week: 5/10  How often does the pt experience symptoms? Constantly  How much have the symptoms interfered with usual daily activities? Quite a bit  How has condition changed since care began at this facility? NA - initial visit  In general, how is the patients overall health? Fair     April Manson, PT,DPT 03/01/2023, 10:11 AM

## 2023-03-05 ENCOUNTER — Ambulatory Visit (HOSPITAL_BASED_OUTPATIENT_CLINIC_OR_DEPARTMENT_OTHER): Payer: Medicare Other | Admitting: Family

## 2023-03-05 ENCOUNTER — Encounter (HOSPITAL_BASED_OUTPATIENT_CLINIC_OR_DEPARTMENT_OTHER): Payer: Self-pay | Admitting: Family

## 2023-03-05 VITALS — BP 140/82 | HR 65 | Ht 72.0 in | Wt 194.4 lb

## 2023-03-05 DIAGNOSIS — I10 Essential (primary) hypertension: Secondary | ICD-10-CM

## 2023-03-05 DIAGNOSIS — E785 Hyperlipidemia, unspecified: Secondary | ICD-10-CM

## 2023-03-05 DIAGNOSIS — I5032 Chronic diastolic (congestive) heart failure: Secondary | ICD-10-CM | POA: Diagnosis not present

## 2023-03-05 DIAGNOSIS — N185 Chronic kidney disease, stage 5: Secondary | ICD-10-CM

## 2023-03-05 DIAGNOSIS — I2510 Atherosclerotic heart disease of native coronary artery without angina pectoris: Secondary | ICD-10-CM

## 2023-03-05 DIAGNOSIS — Z8673 Personal history of transient ischemic attack (TIA), and cerebral infarction without residual deficits: Secondary | ICD-10-CM

## 2023-03-05 MED ORDER — ATORVASTATIN CALCIUM 80 MG PO TABS: 80.0000 mg | ORAL_TABLET | Freq: Every day | ORAL | 3 refills | Status: DC

## 2023-03-05 NOTE — Patient Instructions (Signed)
Medication Instructions:   Continue your current medications.   *If you need a refill on your cardiac medications before your next appointment, please call your pharmacy*  Follow-Up: At [facility], you and your health needs are our priority.  As part of our continuing mission to provide you with exceptional heart care, we have created designated Provider Care Teams.  These Care Teams include your primary Cardiologist (physician) and Advanced Practice Providers (APPs -  Physician Assistants and Nurse Practitioners) who all work together to provide you with the care you need, when you need it.  We recommend signing up for the patient portal called "MyChart".  Sign up information is provided on this After Visit Summary.  MyChart is used to connect with patients for Virtual Visits (Telemedicine).  Patients are able to view lab/test results, encounter notes, upcoming appointments, etc.  Non-urgent messages can be sent to your provider as well.   To learn more about what you can do with MyChart, go to [email or web address].    Your next appointment:   3-4 month(s)  Provider:   Jodelle Red, MD or Gillian Shields, NP    Other Instructions  Tips to Measure your Blood Pressure Correctly   Here's what you can do to ensure a correct reading:  Don't drink a caffeinated beverage or smoke during the 30 minutes before the test.  Sit quietly for five minutes before the test begins.  During the measurement, sit in a chair with your feet on the floor and your arm supported so your elbow is at about heart level.  The inflatable part of the cuff should completely cover at least 80% of your upper arm, and the cuff should be placed on bare skin, not over a shirt.  Don't talk during the measurement.   Blood pressure categories  Blood pressure category SYSTOLIC (upper number)  DIASTOLIC (lower number)  Normal Less than 120 mm Hg and Less than 80 mm Hg  Elevated 120-129 mm Hg and Less  than 80 mm Hg  High blood pressure: Stage 1 hypertension 130-139 mm Hg or 80-89 mm Hg  High blood pressure: Stage 2 hypertension 140 mm Hg or higher or 90 mm Hg or higher  Hypertensive crisis (consult your doctor immediately) Higher than 180 mm Hg and/or Higher than 120 mm Hg  Source: American Heart Association and American Stroke Association. For more on getting your blood pressure under control, buy Controlling Your Blood Pressure, a Special Health Report from [facility].   Blood Pressure Log   Date   Time  Blood Pressure  Example: Nov 1 9 AM 124/78

## 2023-03-05 NOTE — Progress Notes (Signed)
Cardiology Office Note:  .   Date:  03/05/2023  ID:  Antonio Adams, DOB 27-Aug-1945, MRN [id number] PCP: Etta Grandchild, MD  [facility] Providers Cardiologist:  Jodelle Red, MD    History of Present Illness: .   Antonio Adams is a 77 y.o. male with a hx of CKDV, HTN, HLD, diastolic heart failure, leftt PCA CVA, GERD, BPH, cognitive impairment, coronary calcification on CT, metastatic renal carcinoma.  Admitted 12/2021 after 2 weeks of dyspnea found to be profoundly anemic. Troponin elevated in setting of CKD5. Echo unremarkable with LVEF 60-65%, gr1DD.    Saw Dr. Cristal Deer 08/07/22 with BP initially elevated, stable LE swelling. Had declined dialysis with nephrology. Given no plan for HD, ischemic evaluation deferred. BP labile and asked to monitor at home and follow up promptly.   Admitted 08/24/22 after presenting with "spinning" and confusion. MRI brain with acute left PCA CVA. MRA, carotid US, TTE unremarkable. Neurology recommended aspirin and plavix x 3 weeks then plavix alone. Crestor changed to Atorvastatin.   At follow up 09/01/22 BP not at goal and Imdur 60mg  daily initiated.   Last seen 12/11/22 by Dr. Cristal Deer.  He was euvolemic on exam, making urine.  Carvedilol had been transition to metoprolol, unclear why.  Imdur 60 mg daily was continued.    Presents today with his fiance. In the process of downsizing to new home. Has home BP arm cuff but not checking recently.Notes back pain today which he attributes to elevated BP. No chest pain, dyspnea, lightheadedness, nor dizziness. Stable LE edema.   ROS: Please see the history of present illness.    All other systems reviewed and are negative.   Studies Reviewed: .        Cardiac Studies & Procedures       ECHOCARDIOGRAM  ECHOCARDIOGRAM COMPLETE 08/25/2022  Narrative ECHOCARDIOGRAM REPORT    Patient Name:   Antonio Adams Date of Exam: 08/25/2022 Medical Rec #:  [id number]            Height:       75.0 in Accession #:    [id number]          Weight:       191.8 lb Date of Birth:  12/02/1945          BSA:          2.156 m Patient Age:    76 years            BP:           180/101 mmHg Patient Gender: M                   HR:           62 bpm. Exam Location:  Inpatient  Procedure: 2D Echo, 3D Echo, Color Doppler and Cardiac Doppler  Indications:    Stroke I63.9  History:        Patient has prior history of Echocardiogram examinations, most recent 04/08/2022. Stroke; Risk Factors:Diabetes, Dyslipidemia, Non-Smoker and Hypertension.  Sonographer:    Aron Baba Referring Phys: [id number] MIR M [facility]   Sonographer Comments: Suboptimal subcostal window. Image acquisition challenging due to respiratory motion. IMPRESSIONS   1. Left ventricular ejection fraction, by estimation, is 50 to 55%. The left ventricle has low normal function. The left ventricle has no regional wall motion abnormalities. Left ventricular diastolic parameters are consistent with Grade I diastolic dysfunction (impaired relaxation). 2. Right ventricular systolic function is normal. The  right ventricular size is normal. Tricuspid regurgitation signal is inadequate for assessing PA pressure. 3. Left atrial size was mild to moderately dilated. 4. No evidence of mitral valve regurgitation. 5. The aortic valve was not well visualized. Aortic valve regurgitation is not visualized. 6. There is mild dilatation of the aortic root, measuring 40 mm. 7. The inferior vena cava is dilated in size with >50% respiratory variability, suggesting right atrial pressure of 8 mmHg.  FINDINGS Left Ventricle: Left ventricular ejection fraction, by estimation, is 50 to 55%. The left ventricle has low normal function. The left ventricle has no regional wall motion abnormalities. The left ventricular internal cavity size was normal in size. There is no left ventricular hypertrophy. Left ventricular diastolic parameters are  consistent with Grade I diastolic dysfunction (impaired relaxation).  Right Ventricle: The right ventricular size is normal. Right ventricular systolic function is normal. Tricuspid regurgitation signal is inadequate for assessing PA pressure.  Left Atrium: Left atrial size was mild to moderately dilated.  Right Atrium: Right atrial size was normal in size.  Pericardium: There is no evidence of pericardial effusion.  Mitral Valve: No evidence of mitral valve regurgitation.  Tricuspid Valve: Tricuspid valve regurgitation is not demonstrated.  Aortic Valve: The aortic valve was not well visualized. Aortic valve regurgitation is not visualized. Aortic valve mean gradient measures 11.0 mmHg. Aortic valve peak gradient measures 18.9 mmHg. Aortic valve area, by VTI measures 1.53 cm.  Pulmonic Valve: Pulmonic valve regurgitation is not visualized.  Aorta: There is mild dilatation of the aortic root, measuring 40 mm.  Venous: The inferior vena cava is dilated in size with greater than 50% respiratory variability, suggesting right atrial pressure of 8 mmHg.   LEFT VENTRICLE PLAX 2D LVIDd:         5.60 cm   Diastology LVIDs:         4.00 cm   LV e' medial:    4.82 cm/s LV PW:         1.20 cm   LV E/e' medial:  15.6 LV IVS:        0.70 cm   LV e' lateral:   5.70 cm/s LVOT diam:     2.40 cm   LV E/e' lateral: 13.2 LV SV:         76 LV SV Index:   35 LVOT Area:     4.52 cm  3D Volume EF: 3D EF:        46 % LV EDV:       198 ml LV ESV:       107 ml LV SV:        91 ml  RIGHT VENTRICLE RV S prime:     6.96 cm/s TAPSE (M-mode): 1.8 cm  LEFT ATRIUM             Index        RIGHT ATRIUM           Index LA diam:        4.00 cm 1.86 cm/m   RA Area:     24.70 cm LA Vol (A2C):   67.8 ml 31.45 ml/m  RA Volume:   85.60 ml  39.71 ml/m LA Vol (A4C):   90.2 ml 41.84 ml/m LA Biplane Vol: 78.0 ml 36.18 ml/m AORTIC VALVE AV Area (Vmax):    1.49 cm AV Area (Vmean):   1.41 cm AV Area  (VTI):     1.53 cm AV Vmax:  217.50 cm/s AV Vmean:          155.000 cm/s AV VTI:            0.496 m AV Peak Grad:      18.9 mmHg AV Mean Grad:      11.0 mmHg LVOT Vmax:         71.80 cm/s LVOT Vmean:        48.300 cm/s LVOT VTI:          0.168 m LVOT/AV VTI ratio: 0.34  AORTA Ao Root diam: 4.00 cm Ao Asc diam:  3.80 cm  MITRAL VALVE MV Area (PHT): 3.31 cm    SHUNTS MV Decel Time: 229 msec    Systemic VTI:  0.17 m MV E velocity: 75.20 cm/s  Systemic Diam: 2.40 cm MV A velocity: 77.10 cm/s MV E/A ratio:  0.98  [place] signed by Carolan Clines Signature Date/Time: 08/25/2022/11:23:51 AM    Final    MONITORS  CARDIAC EVENT MONITOR 11/13/2018  Narrative Sinus rhythm Frequent episodes of daytime tachycardia are noted and likely represent sinus tachycardia No sustained arrhythmias           Risk Assessment/Calculations:     HYPERTENSION CONTROL Vitals:   03/05/23 1143 03/05/23 1316  BP: (!) 148/86 (!) 140/82    The patient's blood pressure is elevated above target today.  In order to address the patient's elevated BP: Blood pressure will be monitored at home to determine if medication changes need to be made.; Follow up with general cardiology has been recommended.          Physical Exam:   VS:  BP (!) 140/82   Pulse 65   Ht 6' (1.829 m)   Wt 194 lb 6.4 oz (88.2 kg)   SpO2 96%   BMI 26.37 kg/m    Wt Readings from Last 3 Encounters:  03/05/23 194 lb 6.4 oz (88.2 kg)  02/07/23 180 lb (81.6 kg)  01/12/23 184 lb (83.5 kg)    Vitals:   03/05/23 1143 03/05/23 1316  BP: (!) 148/86 (!) 140/82  Pulse: 65   Height: 6' (1.829 m)   Weight: 194 lb 6.4 oz (88.2 kg)   SpO2: 96%   BMI (Calculated): 26.36      GEN: Well nourished, well developed in no acute distress NECK: No JVD; No carotid bruits CARDIAC: RRR, no murmurs, rubs, gallops RESPIRATORY:  Clear to auscultation without rales, wheezing or rhonchi  ABDOMEN: Soft, non-tender,  non-distended EXTREMITIES:  No edema; No deformity   ASSESSMENT AND PLAN: .    HFpEF - Volume stable on Lasix 40mg  QOD. Additional GDMT Imdur, metoprolol. GDMT limited by renal function. Low sodium diet, fluid restriction <2L, and daily weights encouraged. Educated to contact our office for weight gain of 2 lbs overnight or 5 lbs in one week.    HTN -was previously on carvedilol, unclear why/when changed to metoprolol 25mg  BID, Imdur 60mg  daily. Reports stressor of moving and back pain. BP controlled at PCP visit last month.  He will check BP at home over the next 2 weeks.  Will check in via MyChart.  If persistently above goal of 130/80 consider transition back to carvedilol versus increased dose of Imdur.   CKDV - Has declined HD. Follows with nephrology.    Hx of CVA / HLD, LDL goal <70 / Coronary calcification on CT - Stable with no anginal symptoms. No indication for ischemic evaluation.  Ischemic eval previously deferred as would not  want LHC or intervention due to renal function. Continue Aspirin, Plavix, Imdur, Atorvastatin refilled.                Dispo: follow up in 3-4 mos  Signed, Alver Sorrow, NP

## 2023-03-08 ENCOUNTER — Encounter (HOSPITAL_COMMUNITY)
Admission: RE | Admit: 2023-03-08 | Discharge: 2023-03-08 | Disposition: A | Discharge status: Home / Self Care | Payer: Medicare Other | Source: Ambulatory Visit | Attending: Nephrology | Admitting: Nephrology

## 2023-03-08 VITALS — BP 131/79 | HR 68 | Temp 97.7°F | Resp 16

## 2023-03-08 DIAGNOSIS — N185 Chronic kidney disease, stage 5: Secondary | ICD-10-CM | POA: Diagnosis not present

## 2023-03-08 LAB — POCT HEMOGLOBIN-HEMACUE: Hemoglobin: 10.2 g/dL — ABNORMAL LOW (ref 13.0–17.0)

## 2023-03-08 MED ORDER — EPOETIN ALFA-EPBX 40000 UNIT/ML IJ SOLN
INTRAMUSCULAR | Status: AC
  Filled 2023-03-08: qty 1

## 2023-03-08 MED ORDER — EPOETIN ALFA-EPBX 40000 UNIT/ML IJ SOLN
30000.0000 [IU] | INTRAMUSCULAR | Status: DC
  Administered 2023-03-08: 30000 [IU] via SUBCUTANEOUS

## 2023-03-19 ENCOUNTER — Encounter (HOSPITAL_BASED_OUTPATIENT_CLINIC_OR_DEPARTMENT_OTHER): Payer: Self-pay

## 2023-03-22 ENCOUNTER — Encounter (HOSPITAL_COMMUNITY): Payer: Medicare Other

## 2023-03-27 ENCOUNTER — Ambulatory Visit (HOSPITAL_COMMUNITY)
Admission: RE | Admit: 2023-03-27 | Discharge: 2023-03-27 | Disposition: A | Discharge status: Home / Self Care | Payer: Medicare Other | Source: Ambulatory Visit | Attending: Nephrology | Admitting: Nephrology

## 2023-03-27 VITALS — BP 158/83 | HR 60 | Temp 97.2°F | Resp 16

## 2023-03-27 DIAGNOSIS — N185 Chronic kidney disease, stage 5: Secondary | ICD-10-CM | POA: Diagnosis not present

## 2023-03-27 LAB — POCT HEMOGLOBIN-HEMACUE: Hemoglobin: 10.7 g/dL — ABNORMAL LOW (ref 13.0–17.0)

## 2023-03-27 LAB — IRON AND TIBC
Iron: 66 ug/dL (ref 45–182)
Saturation Ratios: 26 % (ref 17.9–39.5)
TIBC: 259 ug/dL (ref 250–450)
UIBC: 193 ug/dL

## 2023-03-27 LAB — FERRITIN: Ferritin: 405 ng/mL — ABNORMAL HIGH (ref 24–336)

## 2023-03-27 MED ORDER — EPOETIN ALFA-EPBX 40000 UNIT/ML IJ SOLN
30000.0000 [IU] | INTRAMUSCULAR | Status: DC
  Administered 2023-03-27: 30000 [IU] via SUBCUTANEOUS

## 2023-03-27 MED ORDER — EPOETIN ALFA-EPBX 40000 UNIT/ML IJ SOLN
INTRAMUSCULAR | Status: AC
  Filled 2023-03-27: qty 1

## 2023-03-29 ENCOUNTER — Telehealth: Payer: Self-pay | Admitting: Home Health

## 2023-03-29 NOTE — Telephone Encounter (Signed)
Patient significant other Ms Antonio Adams called after-hours line, reports feeling patient feeling woozy when he stands up today.  She checked his blood pressure which was 100/47 and 97/57 on each arm.  She states patient's blood pressure was elevated yesterday 140-150 systolically.  He also felt some nausea which improved with medication.  Patient did not complain any chest pain, shortness of breath, profound dizziness, syncope, fever, chills, vomiting, diarrhea.  He has been compliant with his blood pressure medication including Lasix, metoprolol, isosorbide.  Advised patient to hold tonight's metoprolol 25 mg, Recheck BP tomorrow AM.  Advised patient go to the nearest ER if he continued to feel worse or when blood pressure is further lowering or when he developed significant chest pain, shortness of breath, dizziness, syncope. Patient's significant other is agreeable.

## 2023-04-06 DIAGNOSIS — E1122 Type 2 diabetes mellitus with diabetic chronic kidney disease: Secondary | ICD-10-CM | POA: Diagnosis not present

## 2023-04-06 DIAGNOSIS — I120 Hypertensive chronic kidney disease with stage 5 chronic kidney disease or end stage renal disease: Secondary | ICD-10-CM | POA: Diagnosis not present

## 2023-04-06 DIAGNOSIS — D631 Anemia in chronic kidney disease: Secondary | ICD-10-CM | POA: Diagnosis not present

## 2023-04-06 DIAGNOSIS — E785 Hyperlipidemia, unspecified: Secondary | ICD-10-CM | POA: Diagnosis not present

## 2023-04-06 DIAGNOSIS — N185 Chronic kidney disease, stage 5: Secondary | ICD-10-CM | POA: Diagnosis not present

## 2023-04-06 DIAGNOSIS — N2581 Secondary hyperparathyroidism of renal origin: Secondary | ICD-10-CM | POA: Diagnosis not present

## 2023-04-07 LAB — LAB REPORT - SCANNED: EGFR: 4

## 2023-04-09 ENCOUNTER — Ambulatory Visit (HOSPITAL_COMMUNITY)
Admission: RE | Admit: 2023-04-09 | Discharge: 2023-04-09 | Disposition: A | Discharge status: Home / Self Care | Payer: Medicare Other | Source: Ambulatory Visit | Attending: Nephrology | Admitting: Nephrology

## 2023-04-09 VITALS — BP 157/88 | HR 70 | Temp 97.4°F | Resp 16

## 2023-04-09 DIAGNOSIS — N185 Chronic kidney disease, stage 5: Secondary | ICD-10-CM | POA: Diagnosis not present

## 2023-04-09 LAB — POCT HEMOGLOBIN-HEMACUE: Hemoglobin: 10.5 g/dL — ABNORMAL LOW (ref 13.0–17.0)

## 2023-04-09 MED ORDER — EPOETIN ALFA-EPBX 40000 UNIT/ML IJ SOLN
30000.0000 [IU] | INTRAMUSCULAR | Status: DC
Start: 2023-04-09 — End: 2024-04-21
  Administered 2023-04-09: 30000 [IU] via SUBCUTANEOUS

## 2023-04-09 MED ORDER — EPOETIN ALFA-EPBX 40000 UNIT/ML IJ SOLN
INTRAMUSCULAR | Status: AC
  Filled 2023-04-09: qty 1

## 2023-04-10 ENCOUNTER — Encounter (HOSPITAL_COMMUNITY): Payer: Medicare Other

## 2023-04-23 ENCOUNTER — Encounter: Payer: Self-pay | Admitting: Physician Assistant

## 2023-04-23 ENCOUNTER — Encounter (HOSPITAL_COMMUNITY)
Admission: RE | Admit: 2023-04-23 | Discharge: 2023-04-23 | Disposition: A | Discharge status: Home / Self Care | Payer: Medicare Other | Source: Ambulatory Visit | Attending: Nephrology | Admitting: Nephrology

## 2023-04-23 ENCOUNTER — Ambulatory Visit: Payer: Medicare Other | Admitting: Physician Assistant

## 2023-04-23 VITALS — BP 134/69 | HR 72 | Temp 97.4°F

## 2023-04-23 VITALS — BP 121/72 | HR 73 | Resp 20 | Ht 72.0 in

## 2023-04-23 DIAGNOSIS — R413 Other amnesia: Secondary | ICD-10-CM | POA: Diagnosis not present

## 2023-04-23 DIAGNOSIS — N185 Chronic kidney disease, stage 5: Secondary | ICD-10-CM | POA: Diagnosis not present

## 2023-04-23 LAB — FERRITIN: Ferritin: 645 ng/mL — ABNORMAL HIGH (ref 24–336)

## 2023-04-23 LAB — IRON AND TIBC
Iron: 48 ug/dL (ref 45–182)
Saturation Ratios: 21 % (ref 17.9–39.5)
TIBC: 230 ug/dL — ABNORMAL LOW (ref 250–450)
UIBC: 182 ug/dL

## 2023-04-23 LAB — POCT HEMOGLOBIN-HEMACUE: Hemoglobin: 10 g/dL — ABNORMAL LOW (ref 13.0–17.0)

## 2023-04-23 MED ORDER — EPOETIN ALFA-EPBX 40000 UNIT/ML IJ SOLN
30000.0000 [IU] | INTRAMUSCULAR | Status: DC
Start: 2023-04-23 — End: 2024-05-05
  Administered 2023-04-23: 30000 [IU] via SUBCUTANEOUS

## 2023-04-23 MED ORDER — EPOETIN ALFA-EPBX 40000 UNIT/ML IJ SOLN
INTRAMUSCULAR | Status: AC
  Filled 2023-04-23: qty 1

## 2023-04-23 NOTE — Progress Notes (Signed)
Assessment/Plan:   Dementia likely due to multiple etiologies   Antonio Adams is a very pleasant 77 y.o. RH male with a history of hypertension, hyperlipidemia, DM2 with diabetic polyneuropathy, OSA noncompliant with CPAP, history of PTSD, history of lumbar stenosis with joint pain, history of stage IV metastatic renal carcinoma with lymphadenopathy and questionable hepatic involvement noted in 2022 chemo due to poor tolerance to the medications but without any significant progression since May 2023, on surveillance and imaging, vitamin D deficiency, CKD stage V refusing HD, BPH, anemia of CKD, CHF, and recent left parieto-occipital periventricular infarct likely due to ASVD on Plavix seen today in follow up for memory loss. Patient is currently on donepezil 10 mg daily with stable memory with MMSE 19/30 Mood is good.     Follow up in 6  months. Continue donepezil 10 mg daily. Side effects were discussed  Recommend good control of her cardiovascular risk factors and secondary stroke prevention, continue Plavix   Subjective:    This patient is accompanied in the office by his fiance who supplements the history.  Previous records as well as any outside records available were reviewed prior to todays visit. Patient was last seen on 10/18/2022 with MoCA of 12/30.   Any changes in memory since last visit? "It is better"-he says, fiancee says it is stable.  He has some difficulty remembering recent conversations and names as before.  repeats oneself?  Endorsed especially with appointments Disoriented when walking into a room?  "Off-and-on" if coming from the bathroom he may take the wrong turn  Leaving objects?  May misplace things but not in unusual place.   Wandering behavior?  Denies.   Any personality changes since last visit?  Mood is better, only reserved if not feeling well.  Any worsening depression?:  Denies.   Hallucinations or paranoia? Denies.  Seizures? denies    Any sleep  changes?  He is up most of the night, he thinks he has to go somewhere in the middle of the night and wants to get up and get dressed.  Denies vivid dreams, REM behavior or sleepwalking   Sleep apnea?  Endorsed, has not been using the CPAP for a long time Any hygiene concerns? Denies.  Independent of bathing and dressing?  Endorsed  Does the patient needs help with medications?  Neysa Bonito is in charge  Who is in charge of the finances? Steffanie Rainwater is in charge     Any changes in appetite?  Denies. He may skip a meal when not feeling well.       Patient have trouble swallowing? Denies.   Does the patient cook? No Any headaches?   Denies.   Chronic back pain  denies.   Ambulates with difficulty?  Walking well.  Recent falls or head injuries? Denies.     Unilateral weakness, numbness or tingling? denies   Any tremors?  Denies   Any anosmia?  Denies   Any incontinence of urine?  He has urine frequency.   Any bowel dysfunction?   Denies      Patient lives with fiance  Does the patient drive? No longer drives     Initial Visit 04/12/22 : Antonio Adams is a 77 y.o. R-handed male with history of DM2 with diabetic polyneuropathy, OSA on nightly CPAP, PTSD, history of lumbar stenosis with chronic back pain, metastatic renal carcinoma with lymphadenopathy and questionable hepatic involvement noted in 2022, not on chemo due to poor tolerance to the medications, and  without significant progression since May 2023, only on surveillance imaging, stage IV, eczema, vitamin D deficiency, CKD stage V refusing HD, BPH, anemia of CKD  Zinc deficiency, chronic heart failure, mild cognitive impairment, presenting for evaluation of left PCA territory acute CVA.  In review, the patient presented to the ED on 04/07/22 with several day history of lethargy with fall 2 days prior to presentation, as well as 1 day history of mild left frontal temporal dull headache initially felt to be due to anemia.  He denied any   unilateral weakness, numbness, or speech difficulty. Although he denied any vision changes, it was documented that the patient had hemianopia (he denies during this visit) .Patient  never had a similar episode. Denies any history of TIA. Denies vertigo or dizziness. Denies headaches during this visit, dysarthria or dysphagia.  He has a history of MCI, and patient denies any significant memory issues.  Denies any chest pain, or shortness of breath. Denies any fever or chills, or night sweats. No tobacco. No new meds or hormonal supplements. He is on Plavix for 3 months and ASA, then ASA alone. Denies any recent long distance trips or recent surgeries. No sick contacts. No new stressors present in personal life. Patient is compliant with his medications. Patient is not very active. No family history of stroke. Patient was not administered TPA  as is beyond time window for treatment consideration.        Stroke workup at the hospital on 04/03/2022, personally reviewed is remarkable for: MRI of the brain showing a left PCA large infarct and punctate right PCA infarct MRA remarkable for left PCA P3 branch occlusion, ICA siphon atherosclerosis with possible tandem significant stenosis of the right siphon Carotid Dopplers unremarkable 2D echo EF 50 to 55% Lower extremity venous Doppler without DVT LDL 159     Stroke workup 01/6044  Stroke:  left parieto-occipital periventricular infarct.  Old left parieto-occipital periventricular infarct Etiology: Likely small vessel disease CT head age-indeterminate right thalamic lacunar infarct and chronic left PCA infarct, no acute abnormality MRI acute left parieto-occipital periventricular infarct in the region of prior infarct MRA no LVO or hemodynamically significant stenosis Carotid Doppler 1 to 39% stenosis in bilateral ICAs 2D Echo EF 50 to 55%, grade 1 diastolic dysfunction, mild to moderate dilation of left atrium LDL 164 HgbA1c 5.4 VTE prophylaxis -  SQH    PREVIOUS MEDICATIONS:   CURRENT MEDICATIONS:  Outpatient Encounter Medications as of 04/23/2023  Medication Sig   acetaminophen (TYLENOL) 500 MG tablet Take 1,000 mg by mouth every 6 (six) hours as needed for mild pain.   atorvastatin (LIPITOR) 80 MG tablet Take 1 tablet (80 mg total) by mouth daily.   calcitRIOL (ROCALTROL) 0.25 MCG capsule Take 0.25 mcg by mouth daily.   clopidogrel (PLAVIX) 75 MG tablet Take 1 tablet (75 mg total) by mouth daily.   Cyanocobalamin (B-12 PO) Take 1,000 mcg by mouth daily.   cyclobenzaprine (FLEXERIL) 5 MG tablet Take 1 tablet (5 mg total) by mouth 2 (two) times daily as needed for muscle spasms.   donepezil (ARICEPT) 10 MG tablet Take half tablet (5 mg) daily for 2 weeks, then one tablet daily.   DULoxetine (CYMBALTA) 20 MG capsule TAKE 1 CAPSULE BY MOUTH EVERY DAY   ferrous sulfate 325 (65 FE) MG tablet Take 1 tablet (325 mg total) by mouth 2 (two) times daily. Patient needs office visit for further refills (Patient taking differently: Take 325 mg by mouth daily with breakfast.)  fluocinonide-emollient (LIDEX-E) 0.05 % cream Apply 1 Application topically 2 (two) times daily.   furosemide (LASIX) 40 MG tablet TAKE 1 TABLET BY MOUTH EVERY DAY   isosorbide mononitrate (IMDUR) 60 MG 24 hr tablet TAKE 1 TABLET BY MOUTH EVERY DAY   LOKELMA 10 g PACK packet Take 1 packet by mouth daily.   metoprolol tartrate (LOPRESSOR) 25 MG tablet TAKE 1 TABLET BY MOUTH TWICE A DAY   mirtazapine (REMERON SOL-TAB) 15 MG disintegrating tablet Take 15 mg by mouth at bedtime.   mirtazapine (REMERON) 30 MG tablet Take 30 mg by mouth at bedtime.   ondansetron (ZOFRAN) 4 MG tablet Take 4 mg by mouth daily as needed.   oxyCODONE ER (XTAMPZA ER) 9 MG C12A Take 1 capsule by mouth 2 (two) times daily as needed.   polyvinyl alcohol (LIQUIFILM TEARS) 1.4 % ophthalmic solution Place 1 drop into both eyes daily as needed for dry eyes.   sodium bicarbonate 650 MG tablet Take 2 tablets  (1,300 mg total) by mouth 2 (two) times daily.   solifenacin (VESICARE) 5 MG tablet TAKE 1 TABLET (5 MG TOTAL) BY MOUTH DAILY.   tamsulosin (FLOMAX) 0.4 MG CAPS capsule TAKE 1 CAPSULE (0.4 MG TOTAL) BY MOUTH DAILY. (Patient taking differently: Take 0.4 mg by mouth daily.)   zinc gluconate 50 MG tablet Take 1 tablet (50 mg total) by mouth daily.   No facility-administered encounter medications on file as of 04/23/2023.       04/23/2023   12:00 PM 04/12/2022    9:00 AM 08/02/2017    3:19 PM  MMSE - Mini Mental State Exam  Orientation to time 0 3 5  Orientation to Place 5 5 5   Registration 3 3 3   Attention/ Calculation 3 3 4   Recall 0 1 2  Language- name 2 objects 2 2 2   Language- repeat 1 1 1   Language- follow 3 step command 3 3 3   Language- read & follow direction 1 1 1   Write a sentence 1 1 1   Copy design 0 1 1  Total score 19 24 28       10/22/2022    7:00 PM 12/26/2018   10:00 AM 05/29/2018   10:00 AM 10/15/2017   11:00 AM  Montreal Cognitive Assessment   Visuospatial/ Executive (0/5) 1  3 5   Naming (0/3) 3  3 3   Attention: Read list of digits (0/2) 2 1 2 2   Attention: Read list of letters (0/1) 1 1 1 1   Attention: Serial 7 subtraction starting at 100 (0/3) 0 1 2 3   Language: Repeat phrase (0/2) 1 0 1 2  Language : Fluency (0/1) 0 0 0 0  Abstraction (0/2) 1 0 1 2  Delayed Recall (0/5) 0 0 0 2  Orientation (0/6) 2 5 6 6   Total 11  19 26   Adjusted Score (based on education) 12       Objective:     PHYSICAL EXAMINATION:    VITALS:   Vitals:   04/23/23 1129 04/23/23 1203  BP: (!) 154/6 121/72  Pulse: 73   Resp: 20   SpO2: 99%   Height: 6' (1.829 m)     GEN:  The patient appears stated age and is in NAD. HEENT:  Normocephalic, atraumatic.   Neurological examination:  General: NAD, well-groomed, appears stated age. Orientation: The patient is alert. Oriented to place and person, not to date.   Cranial nerves: There is good facial symmetry.The speech is fluent  and clear.  No aphasia or dysarthria. Fund of knowledge is reduced. Recent and remote memory are impaired. Attention and concentration are reduced.  Able to name objects and repeat phrases.  Hearing is intact to conversational tone.  Sensation: Sensation is intact to light touch throughout Motor: Strength is at least antigravity x4. DTR's 2/4 in UE/LE     Movement examination: Tone: There is normal tone in the UE/LE Abnormal movements:  no tremor.  No myoclonus.  No asterixis.   Coordination:  There is no decremation with RAM's. Normal finger to nose  Gait and Station: The patient has no difficulty arising out of a deep-seated chair without the use of the hands.  Uses a R cane. The patient's stride length is good.  Gait is cautious and narrow.    Thank you for allowing [place] the opportunity to participate in the care of this nice patient. Please do not hesitate to contact us for any questions or concerns.   Total time spent on today's visit was 36 minutes dedicated to this patient today, preparing to see patient, examining the patient, ordering tests and/or medications and counseling the patient, documenting clinical information in the EHR or other health record, independently interpreting results and communicating results to the patient/family, discussing treatment and goals, answering patient's questions and coordinating care.  Cc:  Etta Grandchild, MD  Marlowe Kays 04/23/2023 12:31 PM

## 2023-04-23 NOTE — Patient Instructions (Addendum)
It was a pleasure to see you today at our office.   Recommendations:   Continue Donepezil 10 mg  daily.  Follow up Mon June 12 11:30      For assessment of decision of mental capacity and competency:  Call Dr. Erick Blinks, geriatric psychiatrist at [phone] Counseling regarding caregiver distress, including caregiver depression, anxiety and issues regarding community resources, adult day care programs, adult living facilities, or memory care questions:  please contact your  Primary Doctor's Social Worker  Whom to call: Memory  decline, memory medications: Call our office [phone]  For psychiatric meds, mood meds: Please have your primary care physician manage these medications.  If you have any severe symptoms of a stroke, or other severe issues such as confusion,severe chills or fever, etc call 911 or go to the ER as you may need to be evaluated further    RECOMMENDATIONS FOR ALL PATIENTS WITH MEMORY PROBLEMS: 1. Continue to exercise (Recommend 30 minutes of walking everyday, or 3 hours every week) 2. Increase social interactions - continue going to [place] and enjoy social gatherings with friends and family 3. Eat healthy, avoid fried foods and eat more fruits and vegetables 4. Maintain adequate blood pressure, blood sugar, and blood cholesterol level. Reducing the risk of stroke and cardiovascular disease also helps promoting better memory. 5. Avoid stressful situations. Live a simple life and avoid aggravations. Organize your time and prepare for the next day in anticipation. 6. Sleep well, avoid any interruptions of sleep and avoid any distractions in the bedroom that may interfere with adequate sleep quality 7. Avoid sugar, avoid sweets as there is a strong link between excessive sugar intake, diabetes, and cognitive impairment We discussed the Mediterranean diet, which has been shown to help patients reduce the risk of progressive memory disorders and reduces cardiovascular  risk. This includes eating fish, eat fruits and green leafy vegetables, nuts like almonds and hazelnuts, walnuts, and also use olive oil. Avoid fast foods and fried foods as much as possible. Avoid sweets and sugar as sugar use has been linked to worsening of memory function.  There is always a concern of gradual progression of memory problems. If this is the case, then we may need to adjust level of care according to patient needs. Support, both to the patient and caregiver, should then be put into place.      You have been referred for a neuropsychological evaluation (i.e., evaluation of memory and thinking abilities). Please bring someone with you to this appointment if possible, as it is helpful for the doctor to hear from both you and another adult who knows you well. Please bring eyeglasses and hearing aids if you wear them.    The evaluation will take approximately 3 hours and has two parts:   The first part is a clinical interview with the neuropsychologist (Dr. Milbert Coulter or Dr. Roseanne Reno). During the interview, the neuropsychologist will speak with you and the individual you brought to the appointment.    The second part of the evaluation is testing with the doctor's technician Annabelle Harman or Selena Batten). During the testing, the technician will ask you to remember different types of material, solve problems, and answer some questionnaires. Your family member will not be present for this portion of the evaluation.   Please note: We must reserve several hours of the neuropsychologist's time and the psychometrician's time for your evaluation appointment. As such, there is a No-Show fee of $100. If you are unable to attend any of  your appointments, please contact our office as soon as possible to reschedule.    FALL PRECAUTIONS: Be cautious when walking. Scan the area for obstacles that may increase the risk of trips and falls. When getting up in the mornings, sit up at the edge of the bed for a few minutes  before getting out of bed. Consider elevating the bed at the head end to avoid drop of blood pressure when getting up. Walk always in a well-lit room (use night lights in the walls). Avoid area rugs or power cords from appliances in the middle of the walkways. Use a walker or a cane if necessary and consider physical therapy for balance exercise. Get your eyesight checked regularly.  FINANCIAL OVERSIGHT: Supervision, especially oversight when making financial decisions or transactions is also recommended.  HOME SAFETY: Consider the safety of the kitchen when operating appliances like stoves, microwave oven, and blender. Consider having supervision and share cooking responsibilities until no longer able to participate in those. Accidents with firearms and other hazards in the house should be identified and addressed as well.   ABILITY TO BE LEFT ALONE: If patient is unable to contact 911 operator, consider using LifeLine, or when the need is there, arrange for someone to stay with patients. Smoking is a fire hazard, consider supervision or cessation. Risk of wandering should be assessed by caregiver and if detected at any point, supervision and safe proof recommendations should be instituted.  MEDICATION SUPERVISION: Inability to self-administer medication needs to be constantly addressed. Implement a mechanism to ensure safe administration of the medications.   DRIVING: Regarding driving, in patients with progressive memory problems, driving will be impaired. We advise to have someone else do the driving if trouble finding directions or if minor accidents are reported. Independent driving assessment is available to determine safety of driving.   If you are interested in the driving assessment, you can contact the following:  [employer] in [place] [phone]  [facility] [phone]  [facility] [phone] or  [phone]    Mediterranean Diet A Mediterranean diet refers to food and lifestyle choices that are based on the traditions of countries located on the [employer]. This way of eating has been shown to help prevent certain conditions and improve outcomes for people who have chronic diseases, like kidney disease and heart disease. What are tips for following this plan? Lifestyle  Cook and eat meals together with your family, when possible. Drink enough fluid to keep your urine clear or pale yellow. Be physically active every day. This includes: Aerobic exercise like running or swimming. Leisure activities like gardening, walking, or housework. Get 7-8 hours of sleep each night. If recommended by your health care provider, drink red wine in moderation. This means 1 glass a day for nonpregnant women and 2 glasses a day for men. A glass of wine equals 5 oz (150 mL). Reading food labels  Check the serving size of packaged foods. For foods such as rice and pasta, the serving size refers to the amount of cooked product, not dry. Check the total fat in packaged foods. Avoid foods that have saturated fat or trans fats. Check the ingredients list for added sugars, such as corn syrup. Shopping  At the grocery store, buy most of your food from the areas near the walls of the store. This includes: Fresh fruits and vegetables (produce). Grains, beans, nuts, and seeds. Some of these may be available in unpackaged forms  or large amounts (in bulk). Fresh seafood. Poultry and eggs. Low-fat dairy products. Buy whole ingredients instead of prepackaged foods. Buy fresh fruits and vegetables in-season from local farmers markets. Buy frozen fruits and vegetables in resealable bags. If you do not have access to quality fresh seafood, buy precooked frozen shrimp or canned fish, such as tuna, salmon, or sardines. Buy small amounts of raw or cooked vegetables, salads, or olives from the deli or salad bar  at your store. Stock your pantry so you always have certain foods on hand, such as olive oil, canned tuna, canned tomatoes, rice, pasta, and beans. Cooking  Cook foods with extra-virgin olive oil instead of using butter or other vegetable oils. Have meat as a side dish, and have vegetables or grains as your main dish. This means having meat in small portions or adding small amounts of meat to foods like pasta or stew. Use beans or vegetables instead of meat in common dishes like chili or lasagna. Experiment with different cooking methods. Try roasting or broiling vegetables instead of steaming or sauteing them. Add frozen vegetables to soups, stews, pasta, or rice. Add nuts or seeds for added healthy fat at each meal. You can add these to yogurt, salads, or vegetable dishes. Marinate fish or vegetables using olive oil, lemon juice, garlic, and fresh herbs. Meal planning  Plan to eat 1 vegetarian meal one day each week. Try to work up to 2 vegetarian meals, if possible. Eat seafood 2 or more times a week. Have healthy snacks readily available, such as: Vegetable sticks with hummus. Greek yogurt. Fruit and nut trail mix. Eat balanced meals throughout the week. This includes: Fruit: 2-3 servings a day Vegetables: 4-5 servings a day Low-fat dairy: 2 servings a day Fish, poultry, or lean meat: 1 serving a day Beans and legumes: 2 or more servings a week Nuts and seeds: 1-2 servings a day Whole grains: 6-8 servings a day Extra-virgin olive oil: 3-4 servings a day Limit red meat and sweets to only a few servings a month What are my food choices? Mediterranean diet Recommended Grains: Whole-grain pasta. Brown rice. Bulgar wheat. Polenta. Couscous. Whole-wheat bread. Orpah Cobb. Vegetables: Artichokes. Beets. Broccoli. Cabbage. Carrots. Eggplant. Green beans. Chard. Kale. Spinach. Onions. Leeks. Peas. Squash. Tomatoes. Peppers. Radishes. Fruits: Apples. Apricots. Avocado. Berries.  Bananas. Cherries. Dates. Figs. Grapes. Lemons. Melon. Oranges. Peaches. Plums. Pomegranate. Meats and other protein foods: Beans. Almonds. Sunflower seeds. Pine nuts. Peanuts. Cod. Salmon. Scallops. Shrimp. Tuna. Tilapia. Clams. Oysters. Eggs. Dairy: Low-fat milk. Cheese. Greek yogurt. Beverages: Water. Red wine. Herbal tea. Fats and oils: Extra virgin olive oil. Avocado oil. Grape seed oil. Sweets and desserts: [place] yogurt with honey. Baked apples. Poached pears. Trail mix. Seasoning and other foods: Basil. Cilantro. Coriander. Cumin. Mint. Parsley. Sage. Rosemary. Tarragon. Garlic. Oregano. Thyme. Pepper. Balsalmic vinegar. Tahini. Hummus. Tomato sauce. Olives. Mushrooms. Limit these Grains: Prepackaged pasta or rice dishes. Prepackaged cereal with added sugar. Vegetables: Deep fried potatoes (french fries). Fruits: Fruit canned in syrup. Meats and other protein foods: Beef. Pork. Lamb. Poultry with skin. Hot dogs. Tomasa Blase. Dairy: Ice cream. Sour cream. Whole milk. Beverages: Juice. Sugar-sweetened soft drinks. Beer. Liquor and spirits. Fats and oils: Butter. Canola oil. Vegetable oil. Beef fat (tallow). Lard. Sweets and desserts: Cookies. Cakes. Pies. Candy. Seasoning and other foods: Mayonnaise. Premade sauces and marinades. The items listed may not be a complete list. Talk with your dietitian about what dietary choices are right for you. Summary The Mediterranean diet includes both  food and lifestyle choices. Eat a variety of fresh fruits and vegetables, beans, nuts, seeds, and whole grains. Limit the amount of red meat and sweets that you eat. Talk with your health care provider about whether it is safe for you to drink red wine in moderation. This means 1 glass a day for nonpregnant women and 2 glasses a day for men. A glass of wine equals 5 oz (150 mL). This information is not intended to replace advice given to you by your health care provider. Make sure you discuss any questions you  have with your health care provider. Document Released: 12/23/2015 Document Revised: 01/25/2016 Document Reviewed: 12/23/2015 Elsevier Interactive Patient Education  2017 [employer].      [address]

## 2023-04-24 ENCOUNTER — Encounter (HOSPITAL_COMMUNITY): Payer: Medicare Other

## 2023-05-03 ENCOUNTER — Ambulatory Visit
Admission: EM | Admit: 2023-05-03 | Discharge: 2023-05-03 | Disposition: A | Discharge status: Home / Self Care | Payer: No Typology Code available for payment source | Attending: Internal Medicine | Admitting: Internal Medicine

## 2023-05-03 ENCOUNTER — Encounter (HOSPITAL_COMMUNITY): Payer: Self-pay

## 2023-05-03 DIAGNOSIS — H6993 Unspecified Eustachian tube disorder, bilateral: Secondary | ICD-10-CM

## 2023-05-03 MED ORDER — FLUTICASONE PROPIONATE 50 MCG/ACT NA SUSP: 1.0000 | Freq: Every day | NASAL | 0 refills | Status: DC

## 2023-05-03 NOTE — ED Triage Notes (Signed)
Pt presents with c/o bilateral ear pain X 65mo.   Pt he has trouble hearing and feels there is something in there.

## 2023-05-03 NOTE — Discharge Instructions (Addendum)
Start Flonase daily.  Please follow-up with your PCP in 1 week if symptoms do not improve.  Please go to the ER for any worsening symptoms.  Hope you feel better soon!

## 2023-05-03 NOTE — ED Provider Notes (Signed)
[facility]    CSN: [id number] Arrival date & time: 05/03/23  1221      History   Chief Complaint Chief Complaint  Patient presents with   Ear Pain    HPI Antonio Adams is a 77 y.o. male presents for ear fullness/pain.  Patient reports 1 month of intermittent bilateral ear pain/fullness and feels like there might be something in his ears.  Denies any drainage, fevers, URI symptoms.  Does state he went to the [place] urgent care couple weeks ago and had his ears irrigated and was given Debrox eardrops advised to return for additional irrigation but he has not done so.  Denies Q-tips or hearing aids.  No OTC medications have been used since onset.  No other concerns at this time.  HPI  Past Medical History:  Diagnosis Date   ADENOCARCINOMA, PROSTATE, GLEASON GRADE 6 12/21/2009   prostate cancer   ALLERGIC RHINITIS 03/16/2007   Allergy    Anxiety    Arthritis    back    BACK PAIN WITH RADICULOPATHY 11/23/2008   Cancer of kidney (HCC)    partial right kidney removed   CARPAL TUNNEL SYNDROME, BILATERAL 03/16/2007   Chronic back pain    Constipation    takes Senokot daily   Depression    occasionally   DIABETES MELLITUS, TYPE II 03/16/2007   takes Januvia and MEtformin daily   GASTROINTESTINAL HEMORRHAGE, HX OF 03/16/2007   GERD 03/16/2007   takes Omeprazole daily   Glaucoma    mild - no eye drops   Hemorrhoids    History of colon polyps    HYPERLIPIDEMIA 03/16/2007   takes Crestor daily   HYPERTENSION 03/16/2007   takes Diltiazem and Lisinopril daily    Kidney cancer, primary, with metastasis from kidney to other site [facility])    LEG CRAMPS 05/22/2007   Nocturia    Overactive bladder    PARESTHESIA 05/21/2007   Proctitis    PTSD (post-traumatic stress disorder)    wakes up may be dreaming of fighting   Rectal bleeding    Dr.Norins has explained its from the Radiation that he has received   Rectal bleeding    Renal cell carcinoma (HCC)    Sleep apnea    uses CPAP  nightly   Stomach cancer (HCC)    Urinary frequency    takes Toviaz daily    Patient Active Problem List   Diagnosis Date Noted   Chronic idiopathic constipation 02/07/2023   Bronchitis 01/12/2023   Chronic left shoulder pain 01/04/2023   Subacute cough 01/02/2023   Abnormal weight loss 01/02/2023   Encounter for palliative care involving management of pain 11/02/2022   Encounter for general adult medical examination with abnormal findings 01/23/2022   Anemia due to zinc deficiency 12/20/2021   Cancer related pain 12/15/2021   Chronic heart failure with preserved ejection fraction (HFpEF) (HCC) 08/12/2021   Tinea cruris 08/12/2021   Anemia due to chronic kidney disease 07/20/2021   CKD (chronic kidney disease) stage 5, GFR less than 15 ml/min (HCC) 05/24/2021   BPH (benign prostatic hyperplasia) 05/24/2021   Senile nuclear sclerosis 05/20/2021   Vitamin D deficiency 05/20/2021   Vitreous degeneration, unspecified eye 05/20/2021   Intrinsic eczema 04/18/2021   Atherosclerosis of aorta (HCC) 12/30/2020   Mixed conductive and sensorineural hearing loss of left ear with restricted hearing of right ear 09/01/2020   Metastatic renal cell carcinoma (HCC) 02/26/2020   Diabetic polyneuropathy associated with type 2 diabetes mellitus (  HCC) 12/18/2019   Murmur, cardiac 10/08/2018   DDD (degenerative disc disease), cervical 10/08/2018   OAB (overactive bladder) 04/04/2018   Seasonal allergic rhinitis due to pollen 11/01/2016   Spinal stenosis of lumbar region with radiculopathy 10/30/2011    Class: Chronic   Obstructive sleep apnea 11/24/2010   Cognitive impairment 11/24/2010   ADENOCARCINOMA, PROSTATE, GLEASON GRADE 6 12/21/2009   Essential hypertension 03/16/2007   GERD 03/16/2007    Past Surgical History:  Procedure Laterality Date   ADRENALECTOMY Right 02/26/2020   Procedure: OPEN RIGHT RENAL CELL METASTATECTOMY AND  ADRENALECTOMY;  Surgeon: Crist Fat, MD;  Location:  [facility];  Service: Urology;  Laterality: Right;   BACK SURGERY  1983, 2008   repeat surgery; ESI '08   CARPAL TUNNEL RELEASE     bilateral   CERVICAL FUSION     COLONOSCOPY     colonosocpy     ESOPHAGOGASTRODUODENOSCOPY     ED   FLEXIBLE SIGMOIDOSCOPY N/A 01/08/2017   Procedure: FLEXIBLE SIGMOIDOSCOPY;  Surgeon: Hilarie Fredrickson, MD;  Location: [facility] ENDOSCOPY;  Service: Endoscopy;  Laterality: N/A;   HOT HEMOSTASIS N/A 01/08/2017   Procedure: HOT HEMOSTASIS (ARGON PLASMA COAGULATION/BICAP);  Surgeon: Hilarie Fredrickson, MD;  Location: [facility] ENDOSCOPY;  Service: Endoscopy;  Laterality: N/A;   IR RADIOLOGIST EVAL & MGMT  12/17/2019   IR RADIOLOGIST EVAL & MGMT  07/01/2020   IR RADIOLOGIST EVAL & MGMT  08/25/2020   LUMBAR LAMINECTOMY  10/30/2011   Procedure: MICRODISCECTOMY LUMBAR LAMINECTOMY;  Surgeon: Kerrin Champagne, MD;  Location: [facility];  Service: Orthopedics;  Laterality: Right;  Right L4-5 and L5-S1 Microdiscectomy   LUMBAR LAMINECTOMY  06/17/2012   Procedure: MICRODISCECTOMY LUMBAR LAMINECTOMY;  Surgeon: Kerrin Champagne, MD;  [facility];  Service: Orthopedics;  Laterality: N/A;  Right L5-S1 microdiscectomy   OPEN PARTIAL HEPATECTOMY  N/A 02/26/2020   Procedure: OPEN PARTIAL HEPATECTOMY, CHOLECYSTECTOMY;  Surgeon: Almond Lint, MD;  [facility];  Service: General;  Laterality: N/A;   POLYPECTOMY     PROSTATE SURGERY  march 2012   seed implant   RADIOFREQUENCY ABLATION N/A 07/21/2020   Procedure: CT MICROWAVE ABLATION;  Surgeon: Sterling Big, MD;  [facility];  Service: Anesthesiology;  Laterality: N/A;   ROBOTIC ASSITED PARTIAL NEPHRECTOMY Right 09/08/2015   Procedure: XI ROBOTIC ASSITED RIGHT PARTIAL NEPHRECTOMY;  Surgeon: Crist Fat, MD;  [facility];  Service: Urology;  Laterality: Right;  Clamp on: 1033 Clamp off: 1103 Total Clamp Time: 30 minutes   SHOULDER ARTHROSCOPY WITH SUBACROMIAL DECOMPRESSION Left 01/20/2016   Procedure: LEFT SHOULDER ARTHROSCOPY WITH EXTENSIVE   DEBRIDEMENT, ACROMIOPLASTY;  Surgeon: Loreta Ave, MD;  Location: [facility];  Service: Orthopedics;  Laterality: Left;   SHOULDER SURGERY  LFT   Stress Cardiolite  12/24/2003   negative for ischemia   UPPER ESOPHAGEAL ENDOSCOPIC ULTRASOUND (EUS)  04/18/2016   [facility]   UPPER GASTROINTESTINAL ENDOSCOPY     WOUND EXPLORATION     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 Authorizing Provider  fluticasone (FLONASE) 50 MCG/ACT nasal spray Place 1 spray into both nostrils daily. 05/03/23  Yes Radford Pax, NP  acetaminophen (TYLENOL) 500 MG tablet Take 1,000 mg by mouth every 6 (six) hours as needed for mild pain.    [provider]  atorvastatin (LIPITOR) 80 MG tablet Take 1 tablet (80 mg total) by mouth daily. 03/05/23   Alver Sorrow, NP  calcitRIOL (ROCALTROL) 0.25 MCG capsule Take 0.25 mcg by mouth daily. 01/05/22   [provider]  clopidogrel (PLAVIX) 75 MG tablet Take 1 tablet (75 mg total) by mouth daily. 08/30/22   Marcos Eke, PA-C  Cyanocobalamin (B-12 PO) Take 1,000 mcg by mouth daily.    [provider]  cyclobenzaprine (FLEXERIL) 5 MG tablet Take 1 tablet (5 mg total) by mouth 2 (two) times daily as needed for muscle spasms. 03/19/22   Blane Ohara, MD  donepezil (ARICEPT) 10 MG tablet Take half tablet (5 mg) daily for 2 weeks, then one tablet daily. 10/18/22   Marcos Eke, PA-C  DULoxetine (CYMBALTA) 20 MG capsule TAKE 1 CAPSULE BY MOUTH EVERY DAY 12/06/22   Etta Grandchild, MD  ferrous sulfate 325 (65 FE) MG tablet Take 1 tablet (325 mg total) by mouth 2 (two) times daily. Patient needs office visit for further refills Patient taking differently: Take 325 mg by mouth daily with breakfast. 02/24/19   Hilarie Fredrickson, MD  fluocinonide-emollient (LIDEX-E) 0.05 % cream Apply 1 Application topically 2 (two)  times daily. 08/30/22   Etta Grandchild, MD  furosemide (LASIX) 40 MG tablet TAKE 1 TABLET BY MOUTH EVERY DAY 01/01/23   Etta Grandchild, MD  isosorbide mononitrate (IMDUR) 60 MG 24 hr tablet TAKE 1 TABLET BY MOUTH EVERY DAY 11/27/22   Alver Sorrow, NP  LOKELMA 10 g PACK packet Take 1 packet by mouth daily. 01/28/23   [provider]  metoprolol tartrate (LOPRESSOR) 25 MG tablet TAKE 1 TABLET BY MOUTH TWICE A DAY 06/22/22   Jodelle Red, MD  mirtazapine (REMERON SOL-TAB) 15 MG disintegrating tablet Take 15 mg by mouth at bedtime.    [provider]  mirtazapine (REMERON) 30 MG tablet Take 30 mg by mouth at bedtime. 04/06/23   [provider]  ondansetron (ZOFRAN) 4 MG tablet Take 4 mg by mouth daily as needed. 12/29/22   [provider]  oxyCODONE ER (XTAMPZA ER) 9 MG C12A Take 1 capsule by mouth 2 (two) times daily as needed. 11/08/22   Etta Grandchild, MD  polyvinyl alcohol (LIQUIFILM TEARS) 1.4 % ophthalmic solution Place 1 drop into both eyes daily as needed for dry eyes.    [provider]  sodium bicarbonate 650 MG tablet Take 2 tablets (1,300 mg total) by mouth 2 (two) times daily. 04/09/22   Ghimire, Werner Lean, MD  solifenacin (VESICARE) 5 MG tablet TAKE 1 TABLET (5 MG TOTAL) BY MOUTH DAILY. 10/02/22   Etta Grandchild, MD  tamsulosin (FLOMAX) 0.4 MG CAPS capsule TAKE 1 CAPSULE (0.4 MG TOTAL) BY MOUTH DAILY. Patient taking differently: Take 0.4 mg by mouth daily. 01/20/14   Etta Grandchild, MD  zinc gluconate 50 MG tablet Take 1 tablet (50 mg total) by mouth daily. 08/30/22   Etta Grandchild, MD    Family History Family History  Problem Relation Age of Onset   Cancer Sister 35       Cancer, unsure type   Hypertension Other    Colon cancer Neg Hx    Esophageal cancer Neg Hx    Stomach cancer Neg Hx    Diabetes Neg Hx    Colon polyps Neg Hx    Rectal cancer Neg Hx     Social History Social History   Tobacco Use  Smoking status:  Never   Smokeless tobacco: Never  Vaping Use   Vaping status: Never Used  Substance Use Topics   Alcohol use: Not Currently    Alcohol/week: 0.0 standard drinks of alcohol   Drug use: No     Allergies   Ace inhibitors, Gabapentin, Xyzal [levocetirizine], and Opdivo [nivolumab]   Review of Systems Review of Systems  HENT:         Bilateral ear fullness     Physical Exam Triage Vital Signs ED Triage Vitals  Encounter Vitals Group     BP 05/03/23 1236 (!) 146/76     Systolic BP Percentile --      Diastolic BP Percentile --      Pulse Rate 05/03/23 1236 62     Resp 05/03/23 1236 16     Temp 05/03/23 1236 97.9 F (36.6 C)     Temp Source 05/03/23 1236 Oral     SpO2 05/03/23 1236 96 %     Weight --      Height --      Head Circumference --      Peak Flow --      Pain Score 05/03/23 1235 7     Pain Loc --      Pain Education --      Exclude from Growth Chart --    No data found.  Updated Vital Signs BP (!) 146/76 (BP Location: Left Arm)   Pulse 62   Temp 97.9 F (36.6 C) (Oral)   Resp 16   SpO2 96%   Visual Acuity Right Eye Distance:   Left Eye Distance:   Bilateral Distance:    Right Eye Near:   Left Eye Near:    Bilateral Near:     Physical Exam Vitals and nursing note reviewed.  Constitutional:      General: He is not in acute distress.    Appearance: Normal appearance. He is not ill-appearing.  HENT:     Head: Normocephalic and atraumatic.     Right Ear: Ear canal normal. No drainage or swelling. A middle ear effusion is present. There is no impacted cerumen. No foreign body. Tympanic membrane is not erythematous.     Left Ear: Ear canal normal. No drainage or swelling. A middle ear effusion is present. There is no impacted cerumen. No foreign body. Tympanic membrane is not erythematous.  Eyes:     Pupils: Pupils are equal, round, and reactive to light.  Cardiovascular:     Rate and Rhythm: Normal rate.  Pulmonary:     Effort: Pulmonary effort  is normal.  Skin:    General: Skin is warm and dry.  Neurological:     General: No focal deficit present.     Mental Status: He is alert and oriented to person, place, and time.  Psychiatric:        Mood and Affect: Mood normal.        Behavior: Behavior normal.      UC Treatments / Results  Labs (all labs ordered are listed, but only abnormal results are displayed) Labs Reviewed - No data to display  EKG   Radiology No results found.  Procedures Procedures (including critical care time)  Medications Ordered in UC Medications - No data to display  Initial Impression / Assessment and Plan / UC Course  I have reviewed the triage vital signs and the nursing notes.  Pertinent labs & imaging results that were available during my care of the  patient were reviewed by me and considered in my medical decision making (see chart for details).     Reviewed exam and symptoms with patient.  No red flags.  Discussed eustachian tube dysfunction.  Will start Flonase daily.  Advised to follow-up with PCP 1 week for recheck.  ER precautions reviewed. Final Clinical Impressions(s) / UC Diagnoses   Final diagnoses:  Dysfunction of both eustachian tubes     Discharge Instructions      Start Flonase daily.  Please follow-up with your PCP in 1 week if symptoms do not improve.  Please go to the ER for any worsening symptoms.  Hope you feel better soon!   ED Prescriptions     Medication Sig Dispense Auth. Provider   fluticasone (FLONASE) 50 MCG/ACT nasal spray Place 1 spray into both nostrils daily. 15.8 mL Radford Pax, NP      PDMP not reviewed this encounter.   Radford Pax, NP 05/03/23 1250

## 2023-05-04 ENCOUNTER — Encounter (HOSPITAL_COMMUNITY): Payer: Self-pay

## 2023-05-07 ENCOUNTER — Encounter (HOSPITAL_COMMUNITY)
Admission: RE | Admit: 2023-05-07 | Discharge: 2023-05-07 | Disposition: A | Discharge status: Home / Self Care | Payer: Medicare Other | Source: Ambulatory Visit | Attending: Nephrology | Admitting: Nephrology

## 2023-05-07 VITALS — BP 142/82 | HR 72 | Temp 97.3°F

## 2023-05-07 DIAGNOSIS — N185 Chronic kidney disease, stage 5: Secondary | ICD-10-CM | POA: Diagnosis not present

## 2023-05-07 LAB — POCT HEMOGLOBIN-HEMACUE: Hemoglobin: 10.2 g/dL — ABNORMAL LOW (ref 13.0–17.0)

## 2023-05-07 MED ORDER — EPOETIN ALFA-EPBX 40000 UNIT/ML IJ SOLN
INTRAMUSCULAR | Status: AC
  Filled 2023-05-07: qty 1

## 2023-05-07 MED ORDER — EPOETIN ALFA-EPBX 40000 UNIT/ML IJ SOLN
30000.0000 [IU] | INTRAMUSCULAR | Status: DC
Start: 2023-05-07 — End: 2024-05-19
  Administered 2023-05-07: 30000 [IU] via SUBCUTANEOUS

## 2023-05-10 ENCOUNTER — Encounter (HOSPITAL_COMMUNITY): Payer: Medicare Other

## 2023-05-18 ENCOUNTER — Ambulatory Visit
Admission: EM | Admit: 2023-05-18 | Discharge: 2023-05-18 | Disposition: A | Discharge status: Home / Self Care | Payer: No Typology Code available for payment source | Attending: Family Medicine | Admitting: Family Medicine

## 2023-05-18 DIAGNOSIS — H65191 Other acute nonsuppurative otitis media, right ear: Secondary | ICD-10-CM | POA: Diagnosis not present

## 2023-05-18 DIAGNOSIS — H6991 Unspecified Eustachian tube disorder, right ear: Secondary | ICD-10-CM | POA: Diagnosis not present

## 2023-05-18 DIAGNOSIS — N185 Chronic kidney disease, stage 5: Secondary | ICD-10-CM | POA: Diagnosis not present

## 2023-05-18 MED ORDER — AZITHROMYCIN 250 MG PO TABS: ORAL_TABLET | ORAL | 0 refills | Status: DC

## 2023-05-18 NOTE — ED Provider Notes (Signed)
 [facility]  Note:  This document was prepared using Conservation officer, historic buildings and may include unintentional dictation errors.  MRN: [id number] DOB: 03-08-1946  Subjective:   Antonio Adams is a 78 y.o. male presenting for 1 month history of persistent worsening right ear pain.  Reports that pain is internal.  Has a fullness sensation.  Was seen about 2 or 3 weeks ago.  Has been using Flonase .  Has CKD 5, not currently on dialysis.  No current facility-administered medications for this encounter.  Current Outpatient Medications:    acetaminophen  (TYLENOL ) 500 MG tablet, Take 1,000 mg by mouth every 6 (six) hours as needed for mild pain., Disp: , Rfl:    atorvastatin  (LIPITOR) 80 MG tablet, Take 1 tablet (80 mg total) by mouth daily., Disp: 90 tablet, Rfl: 3   calcitRIOL  (ROCALTROL ) 0.25 MCG capsule, Take 0.25 mcg by mouth daily., Disp: , Rfl:    clopidogrel  (PLAVIX ) 75 MG tablet, Take 1 tablet (75 mg total) by mouth daily., Disp: 90 tablet, Rfl: 3   Cyanocobalamin  (B-12 PO), Take 1,000 mcg by mouth daily., Disp: , Rfl:    cyclobenzaprine  (FLEXERIL ) 5 MG tablet, Take 1 tablet (5 mg total) by mouth 2 (two) times daily as needed for muscle spasms., Disp: 6 tablet, Rfl: 0   donepezil  (ARICEPT ) 10 MG tablet, Take half tablet (5 mg) daily for 2 weeks, then one tablet daily., Disp: 30 tablet, Rfl: 11   DULoxetine  (CYMBALTA ) 20 MG capsule, TAKE 1 CAPSULE BY MOUTH EVERY DAY, Disp: 90 capsule, Rfl: 2   ferrous sulfate  325 (65 FE) MG tablet, Take 1 tablet (325 mg total) by mouth 2 (two) times daily. Patient needs office visit for further refills (Patient taking differently: Take 325 mg by mouth daily with breakfast.), Disp: 60 tablet, Rfl: 1   fluocinonide -emollient (LIDEX -E) 0.05 % cream, Apply 1 Application topically 2 (two) times daily., Disp: 60 g, Rfl: 2   fluticasone  (FLONASE ) 50 MCG/ACT nasal spray, Place 1 spray into both nostrils daily., Disp: 15.8 mL,  Rfl: 0   furosemide  (LASIX ) 40 MG tablet, TAKE 1 TABLET BY MOUTH EVERY DAY, Disp: 90 tablet, Rfl: 0   isosorbide  mononitrate (IMDUR ) 60 MG 24 hr tablet, TAKE 1 TABLET BY MOUTH EVERY DAY, Disp: 90 tablet, Rfl: 2   LOKELMA  10 g PACK packet, Take 1 packet by mouth daily., Disp: , Rfl:    metoprolol  tartrate (LOPRESSOR ) 25 MG tablet, TAKE 1 TABLET BY MOUTH TWICE A DAY, Disp: 180 tablet, Rfl: 3   mirtazapine  (REMERON  SOL-TAB) 15 MG disintegrating tablet, Take 15 mg by mouth at bedtime., Disp: , Rfl:    mirtazapine  (REMERON ) 30 MG tablet, Take 30 mg by mouth at bedtime., Disp: , Rfl:    ondansetron  (ZOFRAN ) 4 MG tablet, Take 4 mg by mouth daily as needed., Disp: , Rfl:    oxyCODONE  ER (XTAMPZA  ER) 9 MG C12A, Take 1 capsule by mouth 2 (two) times daily as needed., Disp: 60 capsule, Rfl: 0   polyvinyl alcohol  (LIQUIFILM TEARS) 1.4 % ophthalmic solution, Place 1 drop into both eyes daily as needed for dry eyes., Disp: , Rfl:    sodium bicarbonate  650 MG tablet, Take 2 tablets (1,300 mg total) by mouth 2 (two) times daily., Disp: 60 tablet, Rfl: 1   solifenacin  (VESICARE ) 5 MG tablet, TAKE 1 TABLET (5 MG TOTAL) BY MOUTH DAILY., Disp: 90 tablet, Rfl: 1   tamsulosin  (FLOMAX ) 0.4 MG CAPS capsule, TAKE 1 CAPSULE (0.4 MG  TOTAL) BY MOUTH DAILY. (Patient taking differently: Take 0.4 mg by mouth daily.), Disp: 30 capsule, Rfl: 1   zinc  gluconate 50 MG tablet, Take 1 tablet (50 mg total) by mouth daily., Disp: 90 tablet, Rfl: 1   Allergies  Allergen Reactions   Ace Inhibitors Swelling    lisinopril    Gabapentin  Other (See Comments)    hallucinations   Xyzal  [Levocetirizine] Other (See Comments)    Break out   Opdivo  [Nivolumab ] Rash    Past Medical History:  Diagnosis Date   ADENOCARCINOMA, PROSTATE, GLEASON GRADE 6 12/21/2009   prostate cancer   ALLERGIC RHINITIS 03/16/2007   Allergy    Anxiety    Arthritis    back    BACK PAIN WITH RADICULOPATHY 11/23/2008   Cancer of kidney (HCC)    partial right  kidney removed   CARPAL TUNNEL SYNDROME, BILATERAL 03/16/2007   Chronic back pain    Constipation    takes Senokot daily   Depression    occasionally   DIABETES MELLITUS, TYPE II 03/16/2007   takes Januvia  and MEtformin  daily   GASTROINTESTINAL HEMORRHAGE, HX OF 03/16/2007   GERD 03/16/2007   takes Omeprazole  daily   Glaucoma    mild - no eye drops   Hemorrhoids    History of colon polyps    HYPERLIPIDEMIA 03/16/2007   takes Crestor  daily   HYPERTENSION 03/16/2007   takes Diltiazem  and Lisinopril  daily    Kidney cancer, primary, with metastasis from kidney to other site [facility])    LEG CRAMPS 05/22/2007   Nocturia    Overactive bladder    PARESTHESIA 05/21/2007   Proctitis    PTSD (post-traumatic stress disorder)    wakes up may be dreaming of fighting   Rectal bleeding    Dr.Norins has explained its from the Radiation that he has received   Rectal bleeding    Renal cell carcinoma (HCC)    Sleep apnea    uses CPAP nightly   Stomach cancer (HCC)    Urinary frequency    takes Toviaz  daily     Past Surgical History:  Procedure Laterality Date   ADRENALECTOMY Right 02/26/2020   Procedure: OPEN RIGHT RENAL CELL METASTATECTOMY AND  ADRENALECTOMY;  Surgeon: Cam Morene ORN, MD;  [facility];  Service: Urology;  Laterality: Right;   BACK SURGERY  1983, 2008   repeat surgery; ESI '08   CARPAL TUNNEL RELEASE     bilateral   CERVICAL FUSION     COLONOSCOPY     colonosocpy     ESOPHAGOGASTRODUODENOSCOPY     ED   FLEXIBLE SIGMOIDOSCOPY N/A 01/08/2017   Procedure: FLEXIBLE SIGMOIDOSCOPY;  Surgeon: Abran Norleen SAILOR, MD;  Location: [facility] ENDOSCOPY;  Service: Endoscopy;  Laterality: N/A;   HOT HEMOSTASIS N/A 01/08/2017   Procedure: HOT HEMOSTASIS (ARGON PLASMA COAGULATION/BICAP);  Surgeon: Abran Norleen SAILOR, MD;  Location: [facility] ENDOSCOPY;  Service: Endoscopy;  Laterality: N/A;   IR RADIOLOGIST EVAL & MGMT  12/17/2019   IR RADIOLOGIST EVAL & MGMT  07/01/2020   IR RADIOLOGIST EVAL & MGMT  08/25/2020    LUMBAR LAMINECTOMY  10/30/2011   Procedure: MICRODISCECTOMY LUMBAR LAMINECTOMY;  Surgeon: Lynwood FORBES Better, MD;  Location: [facility];  Service: Orthopedics;  Laterality: Right;  Right L4-5 and L5-S1 Microdiscectomy   LUMBAR LAMINECTOMY  06/17/2012   Procedure: MICRODISCECTOMY LUMBAR LAMINECTOMY;  Surgeon: Lynwood FORBES Better, MD;  Location: [facility];  Service: Orthopedics;  Laterality: N/A;  Right L5-S1 microdiscectomy   OPEN PARTIAL HEPATECTOMY  N/A 02/26/2020   Procedure: OPEN PARTIAL HEPATECTOMY, CHOLECYSTECTOMY;  Surgeon: Aron Shoulders, MD;  [facility];  Service: General;  Laterality: N/A;   POLYPECTOMY     PROSTATE SURGERY  march 2012   seed implant   RADIOFREQUENCY ABLATION N/A 07/21/2020   Procedure: CT MICROWAVE ABLATION;  Surgeon: Karalee Wilkie POUR, MD;  Location: [facility];  Service: Anesthesiology;  Laterality: N/A;   ROBOTIC ASSITED PARTIAL NEPHRECTOMY Right 09/08/2015   Procedure: XI ROBOTIC ASSITED RIGHT PARTIAL NEPHRECTOMY;  Surgeon: Morene LELON Salines, MD;  Location: [facility];  Service: Urology;  Laterality: Right;  Clamp on: 1033 Clamp off: 1103 Total Clamp Time: 30 minutes   SHOULDER ARTHROSCOPY WITH SUBACROMIAL DECOMPRESSION Left 01/20/2016   Procedure: LEFT SHOULDER ARTHROSCOPY WITH EXTENSIVE  DEBRIDEMENT, ACROMIOPLASTY;  Surgeon: Toribio JULIANNA Chancy, MD;  Location: [facility];  Service: Orthopedics;  Laterality: Left;   SHOULDER SURGERY  LFT   Stress Cardiolite  12/24/2003   negative for ischemia   UPPER ESOPHAGEAL ENDOSCOPIC ULTRASOUND (EUS)  04/18/2016   [facility]   UPPER GASTROINTESTINAL ENDOSCOPY     WOUND EXPLORATION     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    Family History  Problem Relation Age of Onset   Cancer Sister 30       Cancer, unsure type   Hypertension Other    Colon cancer Neg Hx    Esophageal cancer Neg Hx    Stomach cancer Neg Hx    Diabetes Neg Hx    Colon polyps Neg Hx    Rectal cancer Neg Hx     Social History    Tobacco Use   Smoking status: Never   Smokeless tobacco: Never  Vaping Use   Vaping status: Never Used  Substance Use Topics   Alcohol  use: Not Currently    Alcohol /week: 0.0 standard drinks of alcohol    Drug use: No    ROS   Objective:   Vitals: BP (!) 182/90 (BP Location: Right Arm)   Pulse 67   Temp (!) 97.3 F (36.3 C) (Oral)   Resp 16   SpO2 97%   Physical Exam Constitutional:      General: He is not in acute distress.    Appearance: Normal appearance. He is well-developed and normal weight. He is not ill-appearing, toxic-appearing or diaphoretic.  HENT:     Head: Normocephalic and atraumatic.     Right Ear: Ear canal and external ear normal. A middle ear effusion is present. There is no impacted cerumen. Tympanic membrane is injected. Tympanic membrane is not erythematous, retracted or bulging.     Left Ear: Tympanic membrane, ear canal and external ear normal.  No middle ear effusion. There is no impacted cerumen. Tympanic membrane is not injected, erythematous, retracted or bulging.     Nose: Nose normal.     Mouth/Throat:     Pharynx: Oropharynx is clear.  Eyes:     General: No scleral icterus.       Right eye: No discharge.        Left eye: No discharge.     Extraocular Movements: Extraocular movements intact.  Cardiovascular:     Rate and Rhythm: Normal rate.  Pulmonary:     Effort: Pulmonary effort is normal.  Musculoskeletal:     Cervical back: Normal range of motion.  Neurological:     Mental Status: He is alert and oriented to person, place, and time.  Psychiatric:  Mood and Affect: Mood normal.        Behavior: Behavior normal.        Thought Content: Thought content normal.        Judgment: Judgment normal.      Assessment and Plan :   PDMP not reviewed this encounter.  1. Other non-recurrent acute nonsuppurative otitis media of right ear   2. Eustachian tube dysfunction, right   3. CKD (chronic kidney disease), stage V (HCC)     Recommend covering for a middle ear infection secondary to ADT.  Start azithromycin .  Use supportive care.  Counseled patient on potential for adverse effects with medications prescribed/recommended today, ER and return-to-clinic precautions discussed, patient verbalized understanding.    Christopher Savannah, [place] 05/18/23 1154

## 2023-05-18 NOTE — ED Triage Notes (Signed)
 Pt c/o right earache x 1 month-states he has been seen for same-NAD-steady gait

## 2023-05-21 ENCOUNTER — Ambulatory Visit (HOSPITAL_COMMUNITY)
Admission: RE | Admit: 2023-05-21 | Discharge: 2023-05-21 | Disposition: A | Discharge status: Home / Self Care | Payer: Medicare Other | Source: Ambulatory Visit | Attending: Nephrology | Admitting: Nephrology

## 2023-05-21 VITALS — BP 140/83 | HR 62 | Temp 97.4°F | Resp 16

## 2023-05-21 DIAGNOSIS — N185 Chronic kidney disease, stage 5: Secondary | ICD-10-CM | POA: Diagnosis not present

## 2023-05-21 LAB — POCT HEMOGLOBIN-HEMACUE: Hemoglobin: 10.1 g/dL — ABNORMAL LOW (ref 13.0–17.0)

## 2023-05-21 LAB — IRON AND TIBC
Iron: 53 ug/dL (ref 45–182)
Saturation Ratios: 20 % (ref 17.9–39.5)
TIBC: 263 ug/dL (ref 250–450)
UIBC: 210 ug/dL

## 2023-05-21 LAB — FERRITIN: Ferritin: 407 ng/mL — ABNORMAL HIGH (ref 24–336)

## 2023-05-21 MED ORDER — EPOETIN ALFA-EPBX 40000 UNIT/ML IJ SOLN
INTRAMUSCULAR | Status: AC
  Administered 2023-05-21: 30000 [IU] via SUBCUTANEOUS
  Filled 2023-05-21: qty 1

## 2023-05-21 MED ORDER — EPOETIN ALFA-EPBX 40000 UNIT/ML IJ SOLN: 30000.0000 [IU] | INTRAMUSCULAR | Status: DC

## 2023-05-22 ENCOUNTER — Other Ambulatory Visit (HOSPITAL_BASED_OUTPATIENT_CLINIC_OR_DEPARTMENT_OTHER): Payer: Self-pay

## 2023-06-01 ENCOUNTER — Ambulatory Visit (INDEPENDENT_AMBULATORY_CARE_PROVIDER_SITE_OTHER): Payer: No Typology Code available for payment source | Admitting: Internal Medicine

## 2023-06-01 ENCOUNTER — Encounter: Payer: Self-pay | Admitting: Internal Medicine

## 2023-06-01 VITALS — BP 120/70 | HR 58 | Temp 98.2°F | Ht 72.0 in | Wt 186.2 lb

## 2023-06-01 DIAGNOSIS — L299 Pruritus, unspecified: Secondary | ICD-10-CM | POA: Diagnosis not present

## 2023-06-01 NOTE — Progress Notes (Signed)
Subjective:    Patient ID: Antonio Adams, male    DOB: [birth year], 78 y.o.   MRN: [id number]      HPI Antonio Adams is here for  Chief Complaint  Patient presents with   Itching on back    Itching on right side of back (more internal than external)     Itching on right side of back - no rash x 1 month.   Otc anti-itch lotion helps.  No other symptoms.   The itching is intermittent. No tingling.   No itching elsewhere.    No new meds, supplements, or products. Mirtazpaine was increased and then the itching started -- they decreased the dose back down w/o change.   Benadryl helped a little, but did not help long term.    Denies itching elsewhere.  Medications and allergies reviewed with patient and updated if appropriate.  Current Outpatient Medications on File Prior to Visit  Medication Sig Dispense Refill   acetaminophen (TYLENOL) 500 MG tablet Take 1,000 mg by mouth every 6 (six) hours as needed for mild pain.     atorvastatin (LIPITOR) 80 MG tablet Take 1 tablet (80 mg total) by mouth daily. 90 tablet 3   calcitRIOL (ROCALTROL) 0.25 MCG capsule Take 0.25 mcg by mouth daily.     clopidogrel (PLAVIX) 75 MG tablet Take 1 tablet (75 mg total) by mouth daily. 90 tablet 3   Cyanocobalamin (B-12 PO) Take 1,000 mcg by mouth daily.     cyclobenzaprine (FLEXERIL) 5 MG tablet Take 1 tablet (5 mg total) by mouth 2 (two) times daily as needed for muscle spasms. 6 tablet 0   donepezil (ARICEPT) 10 MG tablet Take half tablet (5 mg) daily for 2 weeks, then one tablet daily. 30 tablet 11   DULoxetine (CYMBALTA) 20 MG capsule TAKE 1 CAPSULE BY MOUTH EVERY DAY 90 capsule 2   ferrous sulfate 325 (65 FE) MG tablet Take 1 tablet (325 mg total) by mouth 2 (two) times daily. Patient needs office visit for further refills (Patient taking differently: Take 325 mg by mouth daily with breakfast.) 60 tablet 1   fluocinonide-emollient (LIDEX-E) 0.05 % cream Apply 1 Application topically 2 (two) times  daily. 60 g 2   fluticasone (FLONASE) 50 MCG/ACT nasal spray Place 1 spray into both nostrils daily. 15.8 mL 0   furosemide (LASIX) 40 MG tablet TAKE 1 TABLET BY MOUTH EVERY DAY 90 tablet 0   isosorbide mononitrate (IMDUR) 60 MG 24 hr tablet TAKE 1 TABLET BY MOUTH EVERY DAY 90 tablet 2   LOKELMA 10 g PACK packet Take 1 packet by mouth daily.     metoprolol tartrate (LOPRESSOR) 25 MG tablet TAKE 1 TABLET BY MOUTH TWICE A DAY 180 tablet 3   mirtazapine (REMERON SOL-TAB) 15 MG disintegrating tablet Take 15 mg by mouth at bedtime.     mirtazapine (REMERON) 30 MG tablet Take 30 mg by mouth at bedtime.     ondansetron (ZOFRAN) 4 MG tablet Take 4 mg by mouth daily as needed.     oxyCODONE ER (XTAMPZA ER) 9 MG C12A Take 1 capsule by mouth 2 (two) times daily as needed. 60 capsule 0   polyvinyl alcohol (LIQUIFILM TEARS) 1.4 % ophthalmic solution Place 1 drop into both eyes daily as needed for dry eyes.     sodium bicarbonate 650 MG tablet Take 2 tablets (1,300 mg total) by mouth 2 (two) times daily. 60 tablet 1   solifenacin (VESICARE) 5 MG tablet TAKE  1 TABLET (5 MG TOTAL) BY MOUTH DAILY. 90 tablet 1   tamsulosin (FLOMAX) 0.4 MG CAPS capsule TAKE 1 CAPSULE (0.4 MG TOTAL) BY MOUTH DAILY. (Patient taking differently: Take 0.4 mg by mouth daily.) 30 capsule 1   zinc gluconate 50 MG tablet Take 1 tablet (50 mg total) by mouth daily. 90 tablet 1   No current facility-administered medications on file prior to visit.    Review of Systems     Objective:   Vitals:   06/01/23 1016  BP: 120/70  Pulse: (!) 58  Temp: 98.2 F (36.8 C)  SpO2: 100%   BP Readings from Last 3 Encounters:  06/01/23 120/70  05/21/23 (!) 140/83  05/18/23 (!) 182/90   Wt Readings from Last 3 Encounters:  06/01/23 186 lb 3.2 oz (84.5 kg)  03/05/23 194 lb 6.4 oz (88.2 kg)  02/07/23 180 lb (81.6 kg)   Body mass index is 25.25 kg/m.    Physical Exam Constitutional:      General: He is not in acute distress.     Appearance: Normal appearance. He is not ill-appearing.  HENT:     Head: Normocephalic and atraumatic.  Skin:    General: Skin is warm and dry.     Findings: No bruising, erythema, lesion or rash.  Neurological:     Mental Status: He is alert.            Assessment & Plan:    Itching: Acute Him and his wife think it started approximately a month ago It is located in the right mid-lower back and nowhere else He has not had any rash or redness in that area and his skin appears normal Have been using over-the-counter anti-itch cream which does help a little He does have mildly dry skin, that may be contributing but likely not the cause Likely neuropathic itch Discussed continuing to use over-the-counter anti-itch lotions Can try topical lidocaine

## 2023-06-01 NOTE — Patient Instructions (Addendum)
    Your itching is likely related to a superifical nerve that is pinched or irritated.  Treat with otc anti-itch cream or lotions and lidocaine gel as needed.     Medications changes include :   None     Return if symptoms worsen or fail to improve.

## 2023-06-03 ENCOUNTER — Encounter (HOSPITAL_BASED_OUTPATIENT_CLINIC_OR_DEPARTMENT_OTHER): Payer: Self-pay

## 2023-06-03 ENCOUNTER — Other Ambulatory Visit: Payer: Self-pay

## 2023-06-03 ENCOUNTER — Emergency Department (HOSPITAL_BASED_OUTPATIENT_CLINIC_OR_DEPARTMENT_OTHER): Payer: Medicare Other

## 2023-06-03 ENCOUNTER — Emergency Department (HOSPITAL_BASED_OUTPATIENT_CLINIC_OR_DEPARTMENT_OTHER)
Admission: EM | Admit: 2023-06-03 | Discharge: 2023-06-03 | Disposition: A | Discharge status: Home / Self Care | Payer: Medicare Other | Attending: Emergency Medicine | Admitting: Emergency Medicine

## 2023-06-03 DIAGNOSIS — R109 Unspecified abdominal pain: Secondary | ICD-10-CM | POA: Insufficient documentation

## 2023-06-03 DIAGNOSIS — Z7901 Long term (current) use of anticoagulants: Secondary | ICD-10-CM | POA: Insufficient documentation

## 2023-06-03 DIAGNOSIS — N281 Cyst of kidney, acquired: Secondary | ICD-10-CM | POA: Diagnosis not present

## 2023-06-03 DIAGNOSIS — C787 Secondary malignant neoplasm of liver and intrahepatic bile duct: Secondary | ICD-10-CM | POA: Diagnosis not present

## 2023-06-03 DIAGNOSIS — Z85528 Personal history of other malignant neoplasm of kidney: Secondary | ICD-10-CM | POA: Insufficient documentation

## 2023-06-03 DIAGNOSIS — C775 Secondary and unspecified malignant neoplasm of intrapelvic lymph nodes: Secondary | ICD-10-CM | POA: Diagnosis not present

## 2023-06-03 LAB — URINALYSIS, ROUTINE W REFLEX MICROSCOPIC
Bilirubin Urine: NEGATIVE
Glucose, UA: 100 mg/dL — AB
Ketones, ur: NEGATIVE mg/dL
Leukocytes,Ua: NEGATIVE
Nitrite: NEGATIVE
Protein, ur: 100 mg/dL — AB
Specific Gravity, Urine: 1.02 (ref 1.005–1.030)
pH: 7 (ref 5.0–8.0)

## 2023-06-03 LAB — COMPREHENSIVE METABOLIC PANEL
ALT: 98 U/L — ABNORMAL HIGH (ref 0–44)
AST: 64 U/L — ABNORMAL HIGH (ref 15–41)
Albumin: 2.9 g/dL — ABNORMAL LOW (ref 3.5–5.0)
Alkaline Phosphatase: 138 U/L — ABNORMAL HIGH (ref 38–126)
Anion gap: 12 (ref 5–15)
BUN: 83 mg/dL — ABNORMAL HIGH (ref 8–23)
CO2: 20 mmol/L — ABNORMAL LOW (ref 22–32)
Calcium: 10.2 mg/dL (ref 8.9–10.3)
Chloride: 101 mmol/L (ref 98–111)
Creatinine, Ser: 10.83 mg/dL — ABNORMAL HIGH (ref 0.61–1.24)
GFR, Estimated: 4 mL/min — ABNORMAL LOW (ref >60)
Glucose, Bld: 78 mg/dL (ref 70–99)
Potassium: 5.2 mmol/L — ABNORMAL HIGH (ref 3.5–5.1)
Sodium: 133 mmol/L — ABNORMAL LOW (ref 135–145)
Total Bilirubin: 0.6 mg/dL (ref 0.0–1.2)
Total Protein: 5.8 g/dL — ABNORMAL LOW (ref 6.5–8.1)

## 2023-06-03 LAB — CBC WITH DIFFERENTIAL/PLATELET
Abs Immature Granulocytes: 0.02 10*3/uL (ref 0.00–0.07)
Basophils Absolute: 0 10*3/uL (ref 0.0–0.1)
Basophils Relative: 1 %
Eosinophils Absolute: 0.1 10*3/uL (ref 0.0–0.5)
Eosinophils Relative: 4 %
HCT: 29.2 % — ABNORMAL LOW (ref 39.0–52.0)
Hemoglobin: 9.5 g/dL — ABNORMAL LOW (ref 13.0–17.0)
Immature Granulocytes: 1 %
Lymphocytes Relative: 20 %
Lymphs Abs: 0.7 10*3/uL (ref 0.7–4.0)
MCH: 28.8 pg (ref 26.0–34.0)
MCHC: 32.5 g/dL (ref 30.0–36.0)
MCV: 88.5 fL (ref 80.0–100.0)
Monocytes Absolute: 0.6 10*3/uL (ref 0.1–1.0)
Monocytes Relative: 18 %
Neutro Abs: 1.9 10*3/uL (ref 1.7–7.7)
Neutrophils Relative %: 56 %
Platelets: 100 10*3/uL — ABNORMAL LOW (ref 150–400)
RBC: 3.3 MIL/uL — ABNORMAL LOW (ref 4.22–5.81)
RDW: 17.6 % — ABNORMAL HIGH (ref 11.5–15.5)
WBC: 3.3 10*3/uL — ABNORMAL LOW (ref 4.0–10.5)
nRBC: 0 % (ref 0.0–0.2)

## 2023-06-03 LAB — LIPASE, BLOOD: Lipase: 38 U/L (ref 11–51)

## 2023-06-03 LAB — URINALYSIS, MICROSCOPIC (REFLEX)

## 2023-06-03 NOTE — Discharge Instructions (Signed)
You were seen today for right flank pain.  Your CT scan showed worsening metastatic disease which is likely causing your symptoms.  Your lab work is notable for continued poor renal function as well as slightly high potassium due to your kidney function.  You should follow-up with your palliative care providers.  Return for new or worsening symptoms.

## 2023-06-03 NOTE — ED Provider Notes (Signed)
[facility] Provider Note   CSN: [id number] Arrival date & time: 06/03/23  9629     History  Chief Complaint  Patient presents with   Abdominal Pain   Flank Pain    Antonio Adams is a 78 y.o. male.   Abdominal Pain Flank Pain Associated symptoms include abdominal pain.  78 year old male history of prior kidney cancer status post right-sided nephrectomy in remission, ESRD not on dialysis presenting for right flank pain.  Patient is here with his fiance.  Patient has had right flank pain intermittently for some months.  It comes and goes.  It has been worse over the last couple days.  He took oxycodone at home today with some improvement.  Pain is currently mild.  Is mostly in his right flank and right hemiabdomen.  No chest pain or shortness of breath.  No nausea or vomiting.  No fevers or chills.  Normal urinary output without dysuria.  He is under palliative care and on chronic oxycodone but is not under hospice care.  He is interested in workup and potential treatment.  He is not interested in dialysis.     Home Medications Prior to Admission medications   Medication Sig Start Date End Date Taking? Authorizing Provider  acetaminophen (TYLENOL) 500 MG tablet Take 1,000 mg by mouth every 6 (six) hours as needed for mild pain.    [provider]  atorvastatin (LIPITOR) 80 MG tablet Take 1 tablet (80 mg total) by mouth daily. 03/05/23   Alver Sorrow, NP  calcitRIOL (ROCALTROL) 0.25 MCG capsule Take 0.25 mcg by mouth daily. 01/05/22   [provider]  clopidogrel (PLAVIX) 75 MG tablet Take 1 tablet (75 mg total) by mouth daily. 08/30/22   Marcos Eke, PA-C  Cyanocobalamin (B-12 PO) Take 1,000 mcg by mouth daily.    [provider]  cyclobenzaprine (FLEXERIL) 5 MG tablet Take 1 tablet (5 mg total) by mouth 2 (two) times daily as needed for muscle spasms. 03/19/22   Blane Ohara, MD  donepezil  (ARICEPT) 10 MG tablet Take half tablet (5 mg) daily for 2 weeks, then one tablet daily. 10/18/22   Marcos Eke, PA-C  DULoxetine (CYMBALTA) 20 MG capsule TAKE 1 CAPSULE BY MOUTH EVERY DAY 12/06/22   Etta Grandchild, MD  ferrous sulfate 325 (65 FE) MG tablet Take 1 tablet (325 mg total) by mouth 2 (two) times daily. Patient needs office visit for further refills Patient taking differently: Take 325 mg by mouth daily with breakfast. 02/24/19   Hilarie Fredrickson, MD  fluocinonide-emollient (LIDEX-E) 0.05 % cream Apply 1 Application topically 2 (two) times daily. 08/30/22   Etta Grandchild, MD  fluticasone (FLONASE) 50 MCG/ACT nasal spray Place 1 spray into both nostrils daily. 05/03/23   Radford Pax, NP  furosemide (LASIX) 40 MG tablet TAKE 1 TABLET BY MOUTH EVERY DAY 01/01/23   Etta Grandchild, MD  isosorbide mononitrate (IMDUR) 60 MG 24 hr tablet TAKE 1 TABLET BY MOUTH EVERY DAY 11/27/22   Alver Sorrow, NP  LOKELMA 10 g PACK packet Take 1 packet by mouth daily. 01/28/23   [provider]  metoprolol tartrate (LOPRESSOR) 25 MG tablet TAKE 1 TABLET BY MOUTH TWICE A DAY 06/22/22   Jodelle Red, MD  mirtazapine (REMERON SOL-TAB) 15 MG disintegrating tablet Take 15 mg by mouth at bedtime.    [provider]  mirtazapine (REMERON) 30 MG tablet Take 30 mg  by mouth at bedtime. 04/06/23   [provider]  ondansetron (ZOFRAN) 4 MG tablet Take 4 mg by mouth daily as needed. 12/29/22   [provider]  oxyCODONE ER (XTAMPZA ER) 9 MG C12A Take 1 capsule by mouth 2 (two) times daily as needed. 11/08/22   Etta Grandchild, MD  polyvinyl alcohol (LIQUIFILM TEARS) 1.4 % ophthalmic solution Place 1 drop into both eyes daily as needed for dry eyes.    [provider]  sodium bicarbonate 650 MG tablet Take 2 tablets (1,300 mg total) by mouth 2 (two) times daily. 04/09/22   Ghimire, Werner Lean, MD  solifenacin (VESICARE) 5 MG tablet TAKE 1 TABLET (5 MG TOTAL) BY MOUTH  DAILY. 10/02/22   Etta Grandchild, MD  tamsulosin (FLOMAX) 0.4 MG CAPS capsule TAKE 1 CAPSULE (0.4 MG TOTAL) BY MOUTH DAILY. Patient taking differently: Take 0.4 mg by mouth daily. 01/20/14   Etta Grandchild, MD  zinc gluconate 50 MG tablet Take 1 tablet (50 mg total) by mouth daily. 08/30/22   Etta Grandchild, MD      Allergies    Ace inhibitors, Gabapentin, Xyzal [levocetirizine], and Opdivo [nivolumab]    Review of Systems   Review of Systems  Gastrointestinal:  Positive for abdominal pain.  Genitourinary:  Positive for flank pain.  Review of systems completed and notable as per HPI.  ROS otherwise negative.   Physical Exam Updated Vital Signs BP (!) 149/77   Pulse (!) 57   Temp 98 F (36.7 C)   Resp 18   Ht 6' (1.829 m)   Wt 84.5 kg   SpO2 98%   BMI 25.25 kg/m  Physical Exam Vitals and nursing note reviewed.  Constitutional:      General: He is not in acute distress.    Appearance: He is well-developed.  HENT:     Head: Normocephalic and atraumatic.     Nose: Nose normal.     Mouth/Throat:     Mouth: Mucous membranes are moist.     Pharynx: Oropharynx is clear.  Eyes:     Extraocular Movements: Extraocular movements intact.     Conjunctiva/sclera: Conjunctivae normal.     Pupils: Pupils are equal, round, and reactive to light.  Cardiovascular:     Rate and Rhythm: Normal rate and regular rhythm.     Pulses: Normal pulses.     Heart sounds: Normal heart sounds. No murmur heard. Pulmonary:     Effort: Pulmonary effort is normal. No respiratory distress.     Breath sounds: Normal breath sounds.  Abdominal:     Palpations: Abdomen is soft.     Tenderness: There is abdominal tenderness. There is no right CVA tenderness, left CVA tenderness, guarding or rebound.     Comments: Mild tenderness to the right hemiabdomen.  Musculoskeletal:        General: No swelling.     Cervical back: Neck supple.     Right lower leg: No edema.     Left lower leg: No edema.  Skin:     General: Skin is warm and dry.     Capillary Refill: Capillary refill takes less than 2 seconds.  Neurological:     General: No focal deficit present.     Mental Status: He is alert and oriented to person, place, and time. Mental status is at baseline.  Psychiatric:        Mood and Affect: Mood normal.     ED Results / Procedures /  Treatments   Labs (all labs ordered are listed, but only abnormal results are displayed) Labs Reviewed  COMPREHENSIVE METABOLIC PANEL - Abnormal; Notable for the following components:      Result Value   Sodium 133 (*)    Potassium 5.2 (*)    CO2 20 (*)    BUN 83 (*)    Creatinine, Ser 10.83 (*)    Total Protein 5.8 (*)    Albumin 2.9 (*)    AST 64 (*)    ALT 98 (*)    Alkaline Phosphatase 138 (*)    GFR, Estimated 4 (*)    All other components within normal limits  CBC WITH DIFFERENTIAL/PLATELET - Abnormal; Notable for the following components:   WBC 3.3 (*)    RBC 3.30 (*)    Hemoglobin 9.5 (*)    HCT 29.2 (*)    RDW 17.6 (*)    Platelets 100 (*)    All other components within normal limits  URINALYSIS, ROUTINE W REFLEX MICROSCOPIC - Abnormal; Notable for the following components:   Glucose, UA 100 (*)    Hgb urine dipstick MODERATE (*)    Protein, ur 100 (*)    All other components within normal limits  URINALYSIS, MICROSCOPIC (REFLEX) - Abnormal; Notable for the following components:   Bacteria, UA RARE (*)    All other components within normal limits  LIPASE, BLOOD    EKG EKG Interpretation Date/Time:  Sunday June 03 2023 12:12:33 EST Ventricular Rate:  52 PR Interval:  220 QRS Duration:  91 QT Interval:  456 QTC Calculation: 425 R Axis:   34  Text Interpretation: Sinus rhythm Borderline prolonged PR interval Probable left atrial enlargement Abnormal T, consider ischemia, lateral leads  ST changes similar to prior Confirmed by Fulton Reek [phone]) on 06/03/2023 12:54:14 PM  Radiology CT ABDOMEN PELVIS WO  CONTRAST Result Date: 06/03/2023 CLINICAL DATA:  Abdominal pain. Flank pain. Nausea. Renal cell carcinoma. * Tracking Code: BO * EXAM: CT ABDOMEN AND PELVIS WITHOUT CONTRAST TECHNIQUE: Multidetector CT imaging of the abdomen and pelvis was performed following the standard protocol without IV contrast. RADIATION DOSE REDUCTION: This exam was performed according to the departmental dose-optimization program which includes automated exposure control, adjustment of the mA and/or kV according to patient size and/or use of iterative reconstruction technique. COMPARISON:  10/25/2022 FINDINGS: Lower chest: Small right pleural effusion. Hepatobiliary: Posterior right lobe of liver metastasis measures 10.7 x 6.5 cm, image 20/301. On the previous exam this measured 8.7 x 5.2 cm. Metastasis involving the caudate lobe measures 5.9 by 5.2 cm, image 23/3. Previously 2.0 x 1.3 cm. Pancreas: Unremarkable. No pancreatic ductal dilatation or surrounding inflammatory changes. Spleen: Normal in size without focal abnormality. Adrenals/Urinary Tract: Right adrenal gland metastasis measures 2.6 x 2.0 cm, image 26/301. Previously 2.3 x 2.4 cm. Normal left adrenal gland. Stable post treatment changes about the anterior cortex of the right kidney. Bilateral kidney cysts are unchanged. The largest is in the posterior cortex of the right kidney measuring 2.1 cm. No follow-up imaging recommended. No nephrolithiasis or hydronephrosis. Urinary bladder is unremarkable. Stomach/Bowel: Stomach is normal. No pathologic dilatation of the large or small bowel loops. No bowel wall thickening, inflammation or distension. Vascular/Lymphatic: Aortic atherosclerosis. No aneurysm. Nodal metastasis within the periaortic region containing fine internal calcifications has progressed. Lymph node measures 6.1 x 4.2 cm, image 40/3. Previously 5.4 by 4.0 cm. Reproductive: Seed implants within the prostate gland. Other: Thoracolumbar degenerative disc disease. No  acute osseous findings.  Musculoskeletal: Lytic lesion along the inferior aspect of the L3 vertebral body measures 1.6 cm, image 78/602. Previously this measured the same. No pathologic fracture identified IMPRESSION: 1. No acute findings within the abdomen or pelvis. No signs of bowel obstruction or obstructive uropathy. No significant free fluid or fluid collections identified. 2. Progression of hepatic metastasis.1 3. Progression of nodal metastasis within the periaortic region. 4. Stable right adrenal gland metastasis. 5. Stable lytic lesion along the inferior aspect of the L3 vertebral body. 6.  Aortic Atherosclerosis (ICD10-I70.0). Electronically Signed   By: Signa Kell M.D.   On: 06/03/2023 12:09    Procedures Procedures    Medications Ordered in ED Medications - No data to display  ED Course/ Medical Decision Making/ A&P                                  Medical Decision Making Amount and/or Complexity of Data Reviewed Labs: ordered. Radiology: ordered.   Medical Decision Making:   Antonio Adams is a 78 y.o. male who presented to the ED today with acute on chronic pain.  Vital signs reviewed.  Pain is primarily in the right flank and is mildly tender on exam.  Not peritonitic.  He has history of renal failure.  But is not interested in dialysis and is currently under palliative care but not hospice.  I discussed with him this, he still wants workup here.  Will obtain noncontrast CT as well as basic lab work.   Patient placed on continuous vitals and telemetry monitoring while in ED which was reviewed periodically.  Reviewed and confirmed nursing documentation for past medical history, family history, social history.  Reassessment and Plan:   Labs reviewed.  He has stable anemia.  His renal function is stable.  He does have new transaminitis as well as very mild hyperkalemia but no EKG changes.  He has worsening progression of his known metastases on CT scan likely causing  his pain.  I reviewed this with the patient.  He is already talked with oncology, he did not have any treatment options and decision was made to go towards palliative care.  He affirms his decision with nephrology to not pursue dialysis as well.  I discussed this with him.  His pain is still well-controlled now with just his oral pain medication that he took at home.  I discussed with him the plan moving forward, he was comfortable with plan for discharge home, follow-up with his palliative care team, nephrologist and primary care doctor.  I discussed with him that if his pain worsens or he has any other new concerning symptoms he should return to the ED.  He was comfortable with this plan.  His fiance at bedside was also comfortable with this plan.  They have pain medication at home.  Encouraged him to call the palliative care provider tomorrow to help expedite follow-up.    Patient's presentation is most consistent with acute complicated illness / injury requiring diagnostic workup.           Final Clinical Impression(s) / ED Diagnoses Final diagnoses:  Right flank pain    Rx / DC Orders ED Discharge Orders     None         Laurence Spates, MD 06/03/23 1544

## 2023-06-03 NOTE — ED Triage Notes (Signed)
C/o right flank pain and right abdominal pain "for a while". On palliative care. Denies urinary symptoms. C/o nausea.

## 2023-06-04 ENCOUNTER — Ambulatory Visit (HOSPITAL_COMMUNITY)
Admission: RE | Admit: 2023-06-04 | Discharge: 2023-06-04 | Disposition: A | Discharge status: Home / Self Care | Payer: Medicare Other | Source: Ambulatory Visit | Attending: Nephrology | Admitting: Nephrology

## 2023-06-04 ENCOUNTER — Inpatient Hospital Stay (HOSPITAL_COMMUNITY): Admission: RE | Admit: 2023-06-04 | Payer: No Typology Code available for payment source | Source: Ambulatory Visit

## 2023-06-04 ENCOUNTER — Encounter (HOSPITAL_BASED_OUTPATIENT_CLINIC_OR_DEPARTMENT_OTHER): Payer: Self-pay | Admitting: Cardiology

## 2023-06-04 VITALS — BP 127/73 | HR 68 | Temp 97.2°F | Resp 17

## 2023-06-04 DIAGNOSIS — N185 Chronic kidney disease, stage 5: Secondary | ICD-10-CM

## 2023-06-04 MED ORDER — EPOETIN ALFA-EPBX 40000 UNIT/ML IJ SOLN
30000.0000 [IU] | INTRAMUSCULAR | Status: DC
  Administered 2023-06-04: 30000 [IU] via SUBCUTANEOUS

## 2023-06-04 MED ORDER — EPOETIN ALFA-EPBX 40000 UNIT/ML IJ SOLN
INTRAMUSCULAR | Status: AC
  Filled 2023-06-04: qty 1

## 2023-06-05 ENCOUNTER — Encounter: Payer: No Typology Code available for payment source | Admitting: Family

## 2023-06-08 ENCOUNTER — Ambulatory Visit (INDEPENDENT_AMBULATORY_CARE_PROVIDER_SITE_OTHER): Payer: No Typology Code available for payment source | Admitting: Family

## 2023-06-08 ENCOUNTER — Other Ambulatory Visit (INDEPENDENT_AMBULATORY_CARE_PROVIDER_SITE_OTHER): Payer: Medicare Other

## 2023-06-08 ENCOUNTER — Ambulatory Visit (HOSPITAL_BASED_OUTPATIENT_CLINIC_OR_DEPARTMENT_OTHER): Payer: Medicare Other | Admitting: Cardiology

## 2023-06-08 DIAGNOSIS — M79672 Pain in left foot: Secondary | ICD-10-CM

## 2023-06-08 DIAGNOSIS — M79671 Pain in right foot: Secondary | ICD-10-CM | POA: Diagnosis not present

## 2023-06-08 NOTE — Progress Notes (Unsigned)
Office Visit Note   Patient: Antonio Adams           Date of Birth: 1945/12/15           MRN: [id number] Visit Date: 06/08/2023              Requested by: Etta Grandchild, MD [address] [place],  [place] [address] PCP: Etta Grandchild, MD  Chief Complaint  Patient presents with   Right Foot - Pain   Left Foot - Pain      HPI: The patient is a 78 year old gentleman who presents today for evaluation of bilateral foot pain he is complaining of lateral medial posterior essentially global pain of bilateral feet especially at the plantar aspect.  Often has shooting pain from proximal to distal.  He notes this does increase some with prolonged weightbearing he complains of pain to his toes with shoe wear  Complains of painful toenails as well  Does have a history of diabetes as well as stage V kidney disease.  He has trialed gabapentin in the past for his peripheral neuropathy without improvement unfortunately he did have hallucinations with gabapentin and was unable to tolerate this.  Assessment & Plan: Visit Diagnoses:  1. Bilateral foot pain     Plan: Concern for bilateral peripheral neuropathy of his feet unfortunately he did not tolerate the gabapentin.  He would not like to trial Lyrica either today  Due to his significant chronic edema have recommended he consider sizing of his shoewear as his current shoe wear is tight and also has a narrow toebox  Will also refer to vascular for evaluation as he did have weak bilateral dorsalis pedis pulses today with a Doppler  Follow-Up Instructions: No follow-ups on file.   Ortho Exam  Patient is alert, oriented, no adenopathy, well-dressed, normal affect, normal respiratory effort. On examination bilateral feet the feet are plantigrade with no open areas no erythema he does have trace edema bilaterally left worse than right.  There is no erythema Diffuse mild tenderness.  There is no bony tenderness no point tenderness to  bilateral feet  weak biphasic pulse with Doppler bilateral dorsalis pedis.  Thickened and discolored onychomycotic nails x 5 to the right foot.  He is unable to safely trim these.  These were trimmed today after informed consent Imaging: No results found. No images are attached to the encounter.  Labs: Lab Results  Component Value Date   HGBA1C 5.4 08/25/2022   HGBA1C 5.4 04/08/2022   HGBA1C 5.8 (H) 05/23/2021   REPTSTATUS 05/24/2021 FINAL 05/23/2021   CULT  05/23/2021    NO GROWTH Performed at [facility], [address], [place], [place] [address]      Lab Results  Component Value Date   ALBUMIN 2.9 (L) 06/03/2023   ALBUMIN 3.3 (L) 01/22/2023   ALBUMIN 3.0 (L) 10/30/2022    Lab Results  Component Value Date   MG 3.4 (H) 02/07/2023   MG 2.6 (H) 01/22/2023   MG 1.8 07/22/2021   Lab Results  Component Value Date   VD25OH 29 04/05/2022    No results found for: "PREALBUMIN"    Latest Ref Rng & Units 06/03/2023   10:52 AM 05/21/2023    1:01 PM 05/07/2023   12:57 PM  CBC EXTENDED  WBC 4.0 - 10.5 K/uL 3.3     RBC 4.22 - 5.81 MIL/uL 3.30     Hemoglobin 13.0 - 17.0 g/dL 9.5  82.9  56.2  HCT 39.0 - 52.0 % 29.2     Platelets 150 - 400 K/uL 100     NEUT# 1.7 - 7.7 K/uL 1.9     Lymph# 0.7 - 4.0 K/uL 0.7        There is no height or weight on file to calculate BMI.  Orders:  Orders Placed This Encounter  Procedures   XR Foot Complete Left   XR Foot Complete Right   Ambulatory referral to Vascular Surgery   No orders of the defined types were placed in this encounter.    Procedures: No procedures performed  Clinical Data: No additional findings.  ROS:  All other systems negative, except as noted in the HPI. Review of Systems  Objective: Vital Signs: There were no vitals taken for this visit.  Specialty Comments:  No specialty comments available.  PMFS History: Patient Active Problem List   Diagnosis Date Noted   Chronic idiopathic  constipation 02/07/2023   Bronchitis 01/12/2023   Chronic left shoulder pain 01/04/2023   Subacute cough 01/02/2023   Abnormal weight loss 01/02/2023   Encounter for palliative care involving management of pain 11/02/2022   Encounter for general adult medical examination with abnormal findings 01/23/2022   Anemia due to zinc deficiency 12/20/2021   Cancer related pain 12/15/2021   Chronic heart failure with preserved ejection fraction (HFpEF) (HCC) 08/12/2021   Tinea cruris 08/12/2021   Anemia due to chronic kidney disease 07/20/2021   CKD (chronic kidney disease) stage 5, GFR less than 15 ml/min (HCC) 05/24/2021   BPH (benign prostatic hyperplasia) 05/24/2021   Senile nuclear sclerosis 05/20/2021   Vitamin D deficiency 05/20/2021   Vitreous degeneration, unspecified eye 05/20/2021   Intrinsic eczema 04/18/2021   Atherosclerosis of aorta (HCC) 12/30/2020   Mixed conductive and sensorineural hearing loss of left ear with restricted hearing of right ear 09/01/2020   Metastatic renal cell carcinoma (HCC) 02/26/2020   Diabetic polyneuropathy associated with type 2 diabetes mellitus (HCC) 12/18/2019   Murmur, cardiac 10/08/2018   DDD (degenerative disc disease), cervical 10/08/2018   OAB (overactive bladder) 04/04/2018   Seasonal allergic rhinitis due to pollen 11/01/2016   Spinal stenosis of lumbar region with radiculopathy 10/30/2011    Class: Chronic   Obstructive sleep apnea 11/24/2010   Cognitive impairment 11/24/2010   ADENOCARCINOMA, PROSTATE, GLEASON GRADE 6 12/21/2009   Essential hypertension 03/16/2007   GERD 03/16/2007   Past Medical History:  Diagnosis Date   ADENOCARCINOMA, PROSTATE, GLEASON GRADE 6 12/21/2009   prostate cancer   ALLERGIC RHINITIS 03/16/2007   Allergy    Anxiety    Arthritis    back    BACK PAIN WITH RADICULOPATHY 11/23/2008   Cancer of kidney (HCC)    partial right kidney removed   CARPAL TUNNEL SYNDROME, BILATERAL 03/16/2007   Chronic back pain     Constipation    takes Senokot daily   Depression    occasionally   DIABETES MELLITUS, TYPE II 03/16/2007   takes Januvia and MEtformin daily   GASTROINTESTINAL HEMORRHAGE, HX OF 03/16/2007   GERD 03/16/2007   takes Omeprazole daily   Glaucoma    mild - no eye drops   Hemorrhoids    History of colon polyps    HYPERLIPIDEMIA 03/16/2007   takes Crestor daily   HYPERTENSION 03/16/2007   takes Diltiazem and Lisinopril daily    Kidney cancer, primary, with metastasis from kidney to other site [facility])    LEG CRAMPS 05/22/2007   Nocturia    Overactive  bladder    PARESTHESIA 05/21/2007   Proctitis    PTSD (post-traumatic stress disorder)    wakes up may be dreaming of fighting   Rectal bleeding    Dr.Norins has explained its from the Radiation that he has received   Rectal bleeding    Renal cell carcinoma (HCC)    Sleep apnea    uses CPAP nightly   Stomach cancer (HCC)    Urinary frequency    takes Toviaz daily    Family History  Problem Relation Age of Onset   Cancer Sister 58       Cancer, unsure type   Hypertension Other    Colon cancer Neg Hx    Esophageal cancer Neg Hx    Stomach cancer Neg Hx    Diabetes Neg Hx    Colon polyps Neg Hx    Rectal cancer Neg Hx     Past Surgical History:  Procedure Laterality Date   ADRENALECTOMY Right 02/26/2020   Procedure: OPEN RIGHT RENAL CELL METASTATECTOMY AND  ADRENALECTOMY;  Surgeon: Crist Fat, MD;  Location: [facility];  Service: Urology;  Laterality: Right;   BACK SURGERY  1983, 2008   repeat surgery; ESI '08   CARPAL TUNNEL RELEASE     bilateral   CERVICAL FUSION     COLONOSCOPY     colonosocpy     ESOPHAGOGASTRODUODENOSCOPY     ED   FLEXIBLE SIGMOIDOSCOPY N/A 01/08/2017   Procedure: FLEXIBLE SIGMOIDOSCOPY;  Surgeon: Hilarie Fredrickson, MD;  Location: [facility] ENDOSCOPY;  Service: Endoscopy;  Laterality: N/A;   HOT HEMOSTASIS N/A 01/08/2017   Procedure: HOT HEMOSTASIS (ARGON PLASMA COAGULATION/BICAP);  Surgeon: Hilarie Fredrickson, MD;   Location: [facility] ENDOSCOPY;  Service: Endoscopy;  Laterality: N/A;   IR RADIOLOGIST EVAL & MGMT  12/17/2019   IR RADIOLOGIST EVAL & MGMT  07/01/2020   IR RADIOLOGIST EVAL & MGMT  08/25/2020   LUMBAR LAMINECTOMY  10/30/2011   Procedure: MICRODISCECTOMY LUMBAR LAMINECTOMY;  Surgeon: Kerrin Champagne, MD;  Location: [facility];  Service: Orthopedics;  Laterality: Right;  Right L4-5 and L5-S1 Microdiscectomy   LUMBAR LAMINECTOMY  06/17/2012   Procedure: MICRODISCECTOMY LUMBAR LAMINECTOMY;  Surgeon: Kerrin Champagne, MD;  Location: [facility];  Service: Orthopedics;  Laterality: N/A;  Right L5-S1 microdiscectomy   OPEN PARTIAL HEPATECTOMY  N/A 02/26/2020   Procedure: OPEN PARTIAL HEPATECTOMY, CHOLECYSTECTOMY;  Surgeon: Almond Lint, MD;  Location: [facility];  Service: General;  Laterality: N/A;   POLYPECTOMY     PROSTATE SURGERY  march 2012   seed implant   RADIOFREQUENCY ABLATION N/A 07/21/2020   Procedure: CT MICROWAVE ABLATION;  Surgeon: Sterling Big, MD;  Location: [facility];  Service: Anesthesiology;  Laterality: N/A;   ROBOTIC ASSITED PARTIAL NEPHRECTOMY Right 09/08/2015   Procedure: XI ROBOTIC ASSITED RIGHT PARTIAL NEPHRECTOMY;  Surgeon: Crist Fat, MD;  Location[facility];  Service: Urology;  Laterality: Right;  Clamp on: 1033 Clamp off: 1103 Total Clamp Time: 30 minutes   SHOULDER ARTHROSCOPY WITH SUBACROMIAL DECOMPRESSION Left 01/20/2016   Procedure: LEFT SHOULDER ARTHROSCOPY WITH EXTENSIVE  DEBRIDEMENT, ACROMIOPLASTY;  Surgeon: Loreta Ave, MD;  Location: [facility];  Service: Orthopedics;  Laterality: Left;   SHOULDER SURGERY  LFT   Stress Cardiolite  12/24/2003   negative for ischemia   UPPER ESOPHAGEAL ENDOSCOPIC ULTRASOUND (EUS)  04/18/2016   [facility]   UPPER GASTROINTESTINAL ENDOSCOPY     WOUND EXPLORATION     management of a wound that  he sustained in the [employer]   Social History   Occupational History   Occupation: Patent examiner; Chief Strategy Officer: RETIRED     Comment: retired from Patent examiner  Tobacco Use   Smoking status: Never   Smokeless tobacco: Never  Vaping Use   Vaping status: Never Used  Substance and Sexual Activity   Alcohol use: Not Currently    Alcohol/week: 0.0 standard drinks of alcohol   Drug use: No   Sexual activity: Not Currently    Partners: Female

## 2023-06-12 ENCOUNTER — Encounter: Payer: Self-pay | Admitting: Family

## 2023-06-12 DIAGNOSIS — N185 Chronic kidney disease, stage 5: Secondary | ICD-10-CM | POA: Diagnosis not present

## 2023-06-12 DIAGNOSIS — E875 Hyperkalemia: Secondary | ICD-10-CM | POA: Diagnosis not present

## 2023-06-12 DIAGNOSIS — E872 Acidosis, unspecified: Secondary | ICD-10-CM | POA: Diagnosis not present

## 2023-06-12 DIAGNOSIS — E1122 Type 2 diabetes mellitus with diabetic chronic kidney disease: Secondary | ICD-10-CM | POA: Diagnosis not present

## 2023-06-12 DIAGNOSIS — N2581 Secondary hyperparathyroidism of renal origin: Secondary | ICD-10-CM | POA: Diagnosis not present

## 2023-06-12 DIAGNOSIS — I129 Hypertensive chronic kidney disease with stage 1 through stage 4 chronic kidney disease, or unspecified chronic kidney disease: Secondary | ICD-10-CM | POA: Diagnosis not present

## 2023-06-12 DIAGNOSIS — R600 Localized edema: Secondary | ICD-10-CM | POA: Diagnosis not present

## 2023-06-12 DIAGNOSIS — I120 Hypertensive chronic kidney disease with stage 5 chronic kidney disease or end stage renal disease: Secondary | ICD-10-CM | POA: Diagnosis not present

## 2023-06-12 DIAGNOSIS — C649 Malignant neoplasm of unspecified kidney, except renal pelvis: Secondary | ICD-10-CM | POA: Diagnosis not present

## 2023-06-14 ENCOUNTER — Ambulatory Visit (INDEPENDENT_AMBULATORY_CARE_PROVIDER_SITE_OTHER): Payer: No Typology Code available for payment source | Admitting: Internal Medicine

## 2023-06-14 ENCOUNTER — Ambulatory Visit: Payer: Self-pay | Admitting: Internal Medicine

## 2023-06-14 VITALS — BP 140/80 | HR 55 | Temp 97.7°F | Ht 72.0 in | Wt 190.0 lb

## 2023-06-14 DIAGNOSIS — N185 Chronic kidney disease, stage 5: Secondary | ICD-10-CM

## 2023-06-14 DIAGNOSIS — G893 Neoplasm related pain (acute) (chronic): Secondary | ICD-10-CM | POA: Diagnosis not present

## 2023-06-14 DIAGNOSIS — I10 Essential (primary) hypertension: Secondary | ICD-10-CM

## 2023-06-14 MED ORDER — XTAMPZA ER 18 MG PO C12A: EXTENDED_RELEASE_CAPSULE | ORAL | 0 refills | Status: DC

## 2023-06-14 NOTE — Patient Instructions (Signed)
Ok to increase the pain medication as prescribed  Please continue all other medications as before, and refills have been done if requested.  Please have the pharmacy call with any other refills you may need.  Please keep your appointments with your specialists as you may have planned  No further lab or imaging is needed today  Please see Dr Yetta Barre if the pain becomes uncontrolled again

## 2023-06-14 NOTE — Telephone Encounter (Signed)
  Chief Complaint: Right Sided Waist Pain Symptoms: Worsening Pain in the Right Side Frequency: Acute Pertinent Negatives: Patient denies distress like symptoms Disposition: [] ED /[] Urgent Care (no appt availability in office) / [x] Appointment(In office/virtual)/ []  [facility]/ [] Home Care/ [] Refused Recommended Disposition /[] [facility]/ []  Follow-up with PCP Additional Notes: CS is being triaged for right sided waist-hip pain. The patient is without distress at this time, but reports current pain Rx is not working, and the pain is worsening. In office appointment made for today.     Reason for Disposition  [1] MODERATE pain (e.g., interferes with normal activities) AND [2] present > 3 days  Answer Assessment - Initial Assessment Questions 1. ONSET: "When did the muscle aches or body pains start?"      Chronic  2. LOCATION: "What part of your body is hurting?" (e.g., entire body, arms, legs)      Right Side, Waist Area  3. SEVERITY: "How bad is the pain?" (Scale 1-10; or mild, moderate, severe)   - MILD (1-3): doesn't interfere with normal activities    - MODERATE (4-7): interferes with normal activities or awakens from sleep    - SEVERE (8-10):  excruciating pain, unable to do any normal activities      10  4. CAUSE: "What do you think is causing the pains?"     Unsure  5. FEVER: "Have you been having fever?"     No  6. OTHER SYMPTOMS: "Do you have any other symptoms?" (e.g., chest pain, weakness, rash, cold or flu symptoms, weight loss)     Swelling in Feet  8. TRAVEL: "Have you traveled out of the country in the last month?" (e.g., travel history, exposures)     No  Protocols used: Muscle Aches and Body Pain-A-AH

## 2023-06-14 NOTE — Progress Notes (Signed)
Patient ID: JOBANY MONTELLANO, male   DOB: April 22, 1946, 78 y.o.   MRN: [id number]        Chief Complaint: follow up right chest pain/ruq pain       HPI:  RASHAUD YBARBO is a 78 y.o. male here with known hx of ruq malignant metastases, states oxycodone ER 5 mg bid not working well; Pt denies chest pain, increased sob or doe, wheezing, orthopnea, PND, increased LE swelling, palpitations, dizziness or syncope.   Pt denies polydipsia, polyuria, or new focal neuro s/s.    Pt denies fever, wt loss, night sweats, loss of appetite, or other constitutional symptoms         Wt Readings from Last 3 Encounters:  06/14/23 190 lb (86.2 kg)  06/03/23 186 lb 3.2 oz (84.5 kg)  06/01/23 186 lb 3.2 oz (84.5 kg)   BP Readings from Last 3 Encounters:  06/14/23 (!) 140/80  06/04/23 127/73  06/03/23 (!) 149/77         Past Medical History:  Diagnosis Date   ADENOCARCINOMA, PROSTATE, GLEASON GRADE 6 12/21/2009   prostate cancer   ALLERGIC RHINITIS 03/16/2007   Allergy    Anxiety    Arthritis    back    BACK PAIN WITH RADICULOPATHY 11/23/2008   Cancer of kidney (HCC)    partial right kidney removed   CARPAL TUNNEL SYNDROME, BILATERAL 03/16/2007   Chronic back pain    Constipation    takes Senokot daily   Depression    occasionally   DIABETES MELLITUS, TYPE II 03/16/2007   takes Januvia and MEtformin daily   GASTROINTESTINAL HEMORRHAGE, HX OF 03/16/2007   GERD 03/16/2007   takes Omeprazole daily   Glaucoma    mild - no eye drops   Hemorrhoids    History of colon polyps    HYPERLIPIDEMIA 03/16/2007   takes Crestor daily   HYPERTENSION 03/16/2007   takes Diltiazem and Lisinopril daily    Kidney cancer, primary, with metastasis from kidney to other site [facility])    LEG CRAMPS 05/22/2007   Nocturia    Overactive bladder    PARESTHESIA 05/21/2007   Proctitis    PTSD (post-traumatic stress disorder)    wakes up may be dreaming of fighting   Rectal bleeding    Dr.Norins has explained its from the  Radiation that he has received   Rectal bleeding    Renal cell carcinoma (HCC)    Sleep apnea    uses CPAP nightly   Stomach cancer (HCC)    Urinary frequency    takes Toviaz daily   Past Surgical History:  Procedure Laterality Date   ADRENALECTOMY Right 02/26/2020   Procedure: OPEN RIGHT RENAL CELL METASTATECTOMY AND  ADRENALECTOMY;  Surgeon: Crist Fat, MD;  [facility];  Service: Urology;  Laterality: Right;   BACK SURGERY  1983, 2008   repeat surgery; ESI '08   CARPAL TUNNEL RELEASE     bilateral   CERVICAL FUSION     COLONOSCOPY     colonosocpy     ESOPHAGOGASTRODUODENOSCOPY     ED   FLEXIBLE SIGMOIDOSCOPY N/A 01/08/2017   Procedure: FLEXIBLE SIGMOIDOSCOPY;  Surgeon: Hilarie Fredrickson, MD;  Location: [facility] ENDOSCOPY;  Service: Endoscopy;  Laterality: N/A;   HOT HEMOSTASIS N/A 01/08/2017   Procedure: HOT HEMOSTASIS (ARGON PLASMA COAGULATION/BICAP);  Surgeon: Hilarie Fredrickson, MD;  Location: [facility] ENDOSCOPY;  Service: Endoscopy;  Laterality: N/A;   IR RADIOLOGIST EVAL & MGMT  12/17/2019   IR  RADIOLOGIST EVAL & MGMT  07/01/2020   IR RADIOLOGIST EVAL & MGMT  08/25/2020   LUMBAR LAMINECTOMY  10/30/2011   Procedure: MICRODISCECTOMY LUMBAR LAMINECTOMY;  Surgeon: Kerrin Champagne, MD;  Location[facility];  Service: Orthopedics;  Laterality: Right;  Right L4-5 and L5-S1 Microdiscectomy   LUMBAR LAMINECTOMY  06/17/2012   Procedure: MICRODISCECTOMY LUMBAR LAMINECTOMY;  Surgeon: Kerrin Champagne, MD;  Location: [facility];  Service: Orthopedics;  Laterality: N/A;  Right L5-S1 microdiscectomy   OPEN PARTIAL HEPATECTOMY  N/A 02/26/2020   Procedure: OPEN PARTIAL HEPATECTOMY, CHOLECYSTECTOMY;  Surgeon: Almond Lint, MD;  Location[facility];  Service: General;  Laterality: N/A;   POLYPECTOMY     PROSTATE SURGERY  march 2012   seed implant   RADIOFREQUENCY ABLATION N/A 07/21/2020   Procedure: CT MICROWAVE ABLATION;  Surgeon: Sterling Big, MD;  Location: [facility];  Service: Anesthesiology;  Laterality: N/A;    ROBOTIC ASSITED PARTIAL NEPHRECTOMY Right 09/08/2015   Procedure: XI ROBOTIC ASSITED RIGHT PARTIAL NEPHRECTOMY;  Surgeon: Crist Fat, MD;  Location[facility];  Service: Urology;  Laterality: Right;  Clamp on: 1033 Clamp off: 1103 Total Clamp Time: 30 minutes   SHOULDER ARTHROSCOPY WITH SUBACROMIAL DECOMPRESSION Left 01/20/2016   Procedure: LEFT SHOULDER ARTHROSCOPY WITH EXTENSIVE  DEBRIDEMENT, ACROMIOPLASTY;  Surgeon: Loreta Ave, MD;  Location: [facility];  Service: Orthopedics;  Laterality: Left;   SHOULDER SURGERY  LFT   Stress Cardiolite  12/24/2003   negative for ischemia   UPPER ESOPHAGEAL ENDOSCOPIC ULTRASOUND (EUS)  04/18/2016   [facility]   UPPER GASTROINTESTINAL ENDOSCOPY     WOUND EXPLORATION     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 He has never used smokeless tobacco. He reports that he does not currently use alcohol. He reports that he does not use drugs. family history includes Cancer (age of onset: 54) in his sister; Hypertension in an other family member. Allergies  Allergen Reactions   Ace Inhibitors Swelling    lisinopril   Gabapentin Other (See Comments)    hallucinations   Xyzal [Levocetirizine] Other (See Comments)    Break out   Opdivo [Nivolumab] Rash   Current Outpatient Medications on File Prior to Visit  Medication Sig Dispense Refill   acetaminophen (TYLENOL) 500 MG tablet Take 1,000 mg by mouth every 6 (six) hours as needed for mild pain.     atorvastatin (LIPITOR) 80 MG tablet Take 1 tablet (80 mg total) by mouth daily. 90 tablet 3   calcitRIOL (ROCALTROL) 0.25 MCG capsule Take 0.25 mcg by mouth daily.     clopidogrel (PLAVIX) 75 MG tablet Take 1 tablet (75 mg total) by mouth daily. 90 tablet 3   Cyanocobalamin (B-12 PO) Take 1,000 mcg by mouth daily.     cyclobenzaprine (FLEXERIL) 5 MG tablet Take 1 tablet (5 mg total) by mouth 2 (two) times daily as needed for muscle  spasms. 6 tablet 0   donepezil (ARICEPT) 10 MG tablet Take half tablet (5 mg) daily for 2 weeks, then one tablet daily. 30 tablet 11   DULoxetine (CYMBALTA) 20 MG capsule TAKE 1 CAPSULE BY MOUTH EVERY DAY 90 capsule 2   ferrous sulfate 325 (65 FE) MG tablet Take 1 tablet (325 mg total) by mouth 2 (two) times daily. Patient needs office visit for further refills (Patient taking differently: Take 325 mg by mouth daily with breakfast.) 60 tablet 1   fluocinonide-emollient (  LIDEX-E) 0.05 % cream Apply 1 Application topically 2 (two) times daily. 60 g 2   fluticasone (FLONASE) 50 MCG/ACT nasal spray Place 1 spray into both nostrils daily. 15.8 mL 0   furosemide (LASIX) 40 MG tablet TAKE 1 TABLET BY MOUTH EVERY DAY 90 tablet 0   isosorbide mononitrate (IMDUR) 60 MG 24 hr tablet TAKE 1 TABLET BY MOUTH EVERY DAY 90 tablet 2   LOKELMA 10 g PACK packet Take 1 packet by mouth daily.     metoprolol tartrate (LOPRESSOR) 25 MG tablet TAKE 1 TABLET BY MOUTH TWICE A DAY 180 tablet 3   mirtazapine (REMERON SOL-TAB) 15 MG disintegrating tablet Take 15 mg by mouth at bedtime.     mirtazapine (REMERON) 30 MG tablet Take 30 mg by mouth at bedtime.     ondansetron (ZOFRAN) 4 MG tablet Take 4 mg by mouth daily as needed.     polyvinyl alcohol (LIQUIFILM TEARS) 1.4 % ophthalmic solution Place 1 drop into both eyes daily as needed for dry eyes.     sodium bicarbonate 650 MG tablet Take 2 tablets (1,300 mg total) by mouth 2 (two) times daily. 60 tablet 1   solifenacin (VESICARE) 5 MG tablet TAKE 1 TABLET (5 MG TOTAL) BY MOUTH DAILY. 90 tablet 1   tamsulosin (FLOMAX) 0.4 MG CAPS capsule TAKE 1 CAPSULE (0.4 MG TOTAL) BY MOUTH DAILY. (Patient taking differently: Take 0.4 mg by mouth daily.) 30 capsule 1   VELTASSA 8.4 g packet Take 1 packet by mouth daily.     zinc gluconate 50 MG tablet Take 1 tablet (50 mg total) by mouth daily. 90 tablet 1   No current facility-administered medications on file prior to visit.         ROS:  All others reviewed and negative.  Objective        PE:  BP (!) 140/80 (BP Location: Left Arm, Patient Position: Standing;Sitting, Cuff Size: Normal)   Pulse (!) 55   Temp 97.7 F (36.5 C) (Oral)   Ht 6' (1.829 m)   Wt 190 lb (86.2 kg)   SpO2 99%   BMI 25.77 kg/m                 Constitutional: Pt appears in NAD               HENT: Head: NCAT.                Right Ear: External ear normal.                 Left Ear: External ear normal.                Eyes: . Pupils are equal, round, and reactive to light. Conjunctivae and EOM are normal               Nose: without d/c or deformity               Neck: Neck supple. Gross normal ROM               Cardiovascular: Normal rate and regular rhythm.                 Pulmonary/Chest: Effort normal and breath sounds without rales or wheezing.                Abd:  Soft, NT, ND, + BS, no organomegaly               Neurological:  Pt is alert. At baseline orientation, motor grossly intact               Skin: Skin is warm. No rashes, no other new lesions, LE edema - none               Psychiatric: Pt behavior is normal without agitation   Micro: none  Cardiac tracings I have personally interpreted today:  none  Pertinent Radiological findings (summarize): none   Lab Results  Component Value Date   WBC 3.3 (L) 06/03/2023   HGB 9.5 (L) 06/03/2023   HCT 29.2 (L) 06/03/2023   PLT 100 (L) 06/03/2023   GLUCOSE 78 06/03/2023   CHOL 227 (H) 08/25/2022   TRIG 79 08/25/2022   HDL 47 08/25/2022   LDLCALC 164 (H) 08/25/2022   ALT 98 (H) 06/03/2023   AST 64 (H) 06/03/2023   NA 133 (L) 06/03/2023   K 5.2 (H) 06/03/2023   CL 101 06/03/2023   CREATININE 10.83 (H) 06/03/2023   BUN 83 (H) 06/03/2023   CO2 20 (L) 06/03/2023   TSH 2.05 02/07/2023   PSA 0.01 (L) 05/20/2020   INR 1.1 10/30/2022   HGBA1C 5.4 08/25/2022   MICROALBUR 97.0 12/18/2019   Assessment/Plan:  MARCY BOGOSIAN is a 78 y.o. Black or African American [2] male with   has a past medical history of ADENOCARCINOMA, PROSTATE, GLEASON GRADE 6 (12/21/2009), ALLERGIC RHINITIS (03/16/2007), Allergy, Anxiety, Arthritis, BACK PAIN WITH RADICULOPATHY (11/23/2008), Cancer of kidney (HCC), CARPAL TUNNEL SYNDROME, BILATERAL (03/16/2007), Chronic back pain, Constipation, Depression, DIABETES MELLITUS, TYPE II (03/16/2007), GASTROINTESTINAL HEMORRHAGE, HX OF (03/16/2007), GERD (03/16/2007), Glaucoma, Hemorrhoids, History of colon polyps, HYPERLIPIDEMIA (03/16/2007), HYPERTENSION (03/16/2007), Kidney cancer, primary, with metastasis from kidney to other site [facility]), LEG CRAMPS (05/22/2007), Nocturia, Overactive bladder, PARESTHESIA (05/21/2007), Proctitis, PTSD (post-traumatic stress disorder), Rectal bleeding, Rectal bleeding, Renal cell carcinoma (HCC), Sleep apnea, Stomach cancer (HCC), and Urinary frequency.  Chronic pain due to malignant neoplastic disease With uncontrolled pain severe, will essentially double dose with change to oxycodone eR 18 mg bid, but suspect may need higher dosing relatively soon  Essential hypertension BP Readings from Last 3 Encounters:  06/14/23 (!) 140/80  06/04/23 127/73  06/03/23 (!) 149/77   Uncontrolled, likely situational, pt to continue medical treatment lopressor 25 bid   CKD (chronic kidney disease) stage 5, GFR less than 15 ml/min (HCC) Lab Results  Component Value Date   CREATININE 10.83 (H) 06/03/2023   Stable overall, cont to avoid nephrotoxins, poor candidate for HD,  to f/u any worsening symptoms or concerns  Followup: Return if symptoms worsen or fail to improve.  Oliver Barre, MD 06/17/2023 7:18 PM [facility]

## 2023-06-17 ENCOUNTER — Encounter: Payer: Self-pay | Admitting: Internal Medicine

## 2023-06-17 NOTE — Assessment & Plan Note (Signed)
Lab Results  Component Value Date   CREATININE 10.83 (H) 06/03/2023   Stable overall, cont to avoid nephrotoxins, poor candidate for HD,  to f/u any worsening symptoms or concerns

## 2023-06-17 NOTE — Assessment & Plan Note (Signed)
With uncontrolled pain severe, will essentially double dose with change to oxycodone eR 18 mg bid, but suspect may need higher dosing relatively soon

## 2023-06-17 NOTE — Assessment & Plan Note (Signed)
BP Readings from Last 3 Encounters:  06/14/23 (!) 140/80  06/04/23 127/73  06/03/23 (!) 149/77   Uncontrolled, likely situational, pt to continue medical treatment lopressor 25 bid

## 2023-06-18 ENCOUNTER — Encounter (HOSPITAL_COMMUNITY)
Admission: RE | Admit: 2023-06-18 | Discharge: 2023-06-18 | Disposition: A | Discharge status: Home / Self Care | Payer: Medicare Other | Source: Ambulatory Visit | Attending: Nephrology | Admitting: Nephrology

## 2023-06-18 ENCOUNTER — Encounter (HOSPITAL_COMMUNITY): Payer: No Typology Code available for payment source

## 2023-06-18 VITALS — BP 139/81 | HR 63 | Temp 97.4°F | Resp 17

## 2023-06-18 DIAGNOSIS — N185 Chronic kidney disease, stage 5: Secondary | ICD-10-CM | POA: Diagnosis not present

## 2023-06-18 LAB — FERRITIN: Ferritin: 459 ng/mL — ABNORMAL HIGH (ref 24–336)

## 2023-06-18 LAB — IRON AND TIBC
Iron: 32 ug/dL — ABNORMAL LOW (ref 45–182)
Saturation Ratios: 13 % — ABNORMAL LOW (ref 17.9–39.5)
TIBC: 242 ug/dL — ABNORMAL LOW (ref 250–450)
UIBC: 210 ug/dL

## 2023-06-18 LAB — POCT HEMOGLOBIN-HEMACUE: Hemoglobin: 9.8 g/dL — ABNORMAL LOW (ref 13.0–17.0)

## 2023-06-18 MED ORDER — EPOETIN ALFA-EPBX 40000 UNIT/ML IJ SOLN
30000.0000 [IU] | INTRAMUSCULAR | Status: DC
Start: 2023-06-18 — End: 2024-06-30

## 2023-06-18 MED ORDER — EPOETIN ALFA-EPBX 40000 UNIT/ML IJ SOLN
INTRAMUSCULAR | Status: AC
  Administered 2023-06-18: 30000 [IU] via SUBCUTANEOUS
  Filled 2023-06-18: qty 1

## 2023-07-02 ENCOUNTER — Encounter (HOSPITAL_COMMUNITY)
Admission: RE | Admit: 2023-07-02 | Discharge: 2023-07-02 | Disposition: A | Discharge status: Home / Self Care | Payer: Medicare Other | Source: Ambulatory Visit | Attending: Nephrology | Admitting: Nephrology

## 2023-07-02 VITALS — BP 115/70 | HR 61 | Temp 97.4°F | Resp 19

## 2023-07-02 DIAGNOSIS — N185 Chronic kidney disease, stage 5: Secondary | ICD-10-CM

## 2023-07-02 LAB — POCT HEMOGLOBIN-HEMACUE: Hemoglobin: 10.4 g/dL — ABNORMAL LOW (ref 13.0–17.0)

## 2023-07-02 MED ORDER — EPOETIN ALFA-EPBX 40000 UNIT/ML IJ SOLN
30000.0000 [IU] | INTRAMUSCULAR | Status: DC
  Administered 2023-07-02: 30000 [IU] via SUBCUTANEOUS

## 2023-07-02 MED ORDER — EPOETIN ALFA-EPBX 40000 UNIT/ML IJ SOLN
INTRAMUSCULAR | Status: AC
  Filled 2023-07-02: qty 1

## 2023-07-13 ENCOUNTER — Other Ambulatory Visit: Payer: Self-pay | Admitting: *Deleted

## 2023-07-13 DIAGNOSIS — M79673 Pain in unspecified foot: Secondary | ICD-10-CM

## 2023-07-16 ENCOUNTER — Inpatient Hospital Stay (HOSPITAL_COMMUNITY): Admission: RE | Admit: 2023-07-16 | Payer: No Typology Code available for payment source | Source: Ambulatory Visit

## 2023-07-17 ENCOUNTER — Encounter (HOSPITAL_COMMUNITY)

## 2023-07-19 ENCOUNTER — Other Ambulatory Visit: Payer: Self-pay | Admitting: Internal Medicine

## 2023-07-19 ENCOUNTER — Telehealth: Payer: Self-pay

## 2023-07-19 DIAGNOSIS — I5032 Chronic diastolic (congestive) heart failure: Secondary | ICD-10-CM

## 2023-07-19 DIAGNOSIS — N185 Chronic kidney disease, stage 5: Secondary | ICD-10-CM

## 2023-07-19 NOTE — Telephone Encounter (Signed)
Will you?

## 2023-07-19 NOTE — Telephone Encounter (Signed)
 Copied from CRM [id number]. Topic: Referral - Request for Referral >> Jul 18, 2023  4:54 PM Sim Boast F wrote: Did the patient discuss referral with their provider in the last year? No (If No - schedule appointment) (If Yes - send message)  Appointment offered? No  Type of order/referral and detailed reason for visit: Hospice Referral order needed. Wants to know if Dr. Yetta Barre will remain the attending physician? And if Dr. Yetta Barre wants to manage patient symptoms or have hospice doctors take care of that?  Also will be sending fax over  Preference of office, provider, location:  If referral order, have you been seen by this specialty before? No (If Yes, this issue or another issue? When? Where?  Can we respond through MyChart? No

## 2023-07-20 ENCOUNTER — Encounter: Payer: No Typology Code available for payment source | Admitting: Vascular Surgery

## 2023-07-20 ENCOUNTER — Ambulatory Visit (HOSPITAL_COMMUNITY): Payer: No Typology Code available for payment source

## 2023-07-20 NOTE — Telephone Encounter (Signed)
Orders has been given.

## 2023-07-20 NOTE — Telephone Encounter (Signed)
 Will you serve as the attending ?  Do you want to Defer Symptom management to the hospice doctors or will you manage that too ?  DO you agree that the patient has 6 months or less to live if his disease runs its normal course.    [phone] ext [phone] STAR.   Please send this pack to me.

## 2023-07-21 DIAGNOSIS — I10 Essential (primary) hypertension: Secondary | ICD-10-CM | POA: Diagnosis not present

## 2023-07-21 DIAGNOSIS — R531 Weakness: Secondary | ICD-10-CM | POA: Diagnosis not present

## 2023-07-21 DIAGNOSIS — R0902 Hypoxemia: Secondary | ICD-10-CM | POA: Diagnosis not present

## 2023-07-21 NOTE — Telephone Encounter (Signed)
 On call -call received  Pt in stage 5 renal failure and is in palliative care No eating or drinking  No food since 2/27, small sip of water with meds and last urination Friday   Family tried to call palliative care to ask what to do and cannot get them to come out on the weekend (no nurse available) Sounds like possible hospice status?   Instructed caller to talk to family  If patient and family do not want life extending intervention then they can inquire about hospice / call office Monday   Otherwise if pt desires transport to the Er for fluids or other supportive care can do so   Asked them to please keep [place] utd and please call back if needed   Will cc to pcp and toute back to self to monitor progress

## 2023-07-24 DIAGNOSIS — R404 Transient alteration of awareness: Secondary | ICD-10-CM | POA: Diagnosis not present

## 2023-07-24 DIAGNOSIS — R531 Weakness: Secondary | ICD-10-CM | POA: Diagnosis not present

## 2023-07-24 DIAGNOSIS — I499 Cardiac arrhythmia, unspecified: Secondary | ICD-10-CM | POA: Diagnosis not present

## 2023-07-24 DIAGNOSIS — Z743 Need for continuous supervision: Secondary | ICD-10-CM | POA: Diagnosis not present

## 2023-07-25 ENCOUNTER — Telehealth: Payer: Self-pay | Admitting: *Deleted

## 2023-07-25 NOTE — Transitions of Care (Post Inpatient/ED Visit) (Signed)
   07/25/2023  Name: Antonio Adams MRN: [id number] DOB: 06-25-1945  Today's TOC FU Call Status: Today's TOC FU Call Status:: Successful TOC FU Call Completed TOC FU Call Complete Date: 07/25/23 Patient's Name and Date of Birth confirmed.  Transition Care Management Follow-up Telephone Call Date of Discharge: 07/24/23 Discharge Facility: Other [facility]) Name of Other (Non[facility]) Discharge Facility: [facility] Type of Discharge: Inpatient Admission Primary Inpatient Discharge Diagnosis:: Failure to Thrive: Transition to Hospice Care  Spoke with Denny Peon who confirmed patient is established and active with [facility] hospice at home team: reports he was admitted yesterday at 2 pm; full TOC call deferred accordingly  Interventions Today    Flowsheet Row Most Recent Value  Chronic Disease   Chronic disease during today's visit Other  [Failure to thrive with transition to hospice care at home]  General Interventions   General Interventions Discussed/Reviewed Communication with  Communication with --  [Spoke with Erin who confirmed patient is established and active with [facility] hospice at home team: reports he was admitted yesterday at 2 pm]      Items Reviewed:    Medications Reviewed Today: Medications Reviewed Today   Medications were not reviewed in this encounter     Home Care and Equipment/Supplies:    Functional Questionnaire:    Follow up appointments reviewed:     Total time spent from review to signing of note/ including any care coordination interventions:  17 minutes  Pls call/ message for questions,  Caryl Pina, RN, BSN, CCRN Alumnus RN Care Manager  Transitions of Care  [facility] - [facility] [phone]: direct office

## 2023-07-30 ENCOUNTER — Encounter (HOSPITAL_COMMUNITY): Payer: No Typology Code available for payment source

## 2023-08-08 ENCOUNTER — Telehealth: Payer: Self-pay | Admitting: Physician Assistant

## 2023-08-08 NOTE — Telephone Encounter (Signed)
 Caller stated patient passed away 08/26/2023

## 2023-08-14 DEATH — deceased

## 2023-08-15 ENCOUNTER — Encounter (HOSPITAL_COMMUNITY)

## 2023-08-15 ENCOUNTER — Encounter: Admitting: Vascular Surgery

## 2023-09-12 ENCOUNTER — Ambulatory Visit (HOSPITAL_BASED_OUTPATIENT_CLINIC_OR_DEPARTMENT_OTHER): Payer: Medicare Other | Admitting: Cardiology

## 2023-10-25 ENCOUNTER — Ambulatory Visit: Payer: Medicare Other | Admitting: Physician Assistant

## 2023-12-19 ENCOUNTER — Other Ambulatory Visit (HOSPITAL_BASED_OUTPATIENT_CLINIC_OR_DEPARTMENT_OTHER): Payer: Self-pay

## 2024-02-13 ENCOUNTER — Other Ambulatory Visit (HOSPITAL_BASED_OUTPATIENT_CLINIC_OR_DEPARTMENT_OTHER): Payer: Self-pay
